# Patient Record
Sex: Male | Born: 1950 | Race: Black or African American | Hispanic: No | Marital: Married | Smoking: Former smoker
Health system: Southern US, Community
[De-identification: ages and names within clinical notes are randomized; demographics above are authoritative.]

## PROBLEM LIST (undated history)

## (undated) DIAGNOSIS — I219 Acute myocardial infarction, unspecified: Secondary | ICD-10-CM

## (undated) DIAGNOSIS — I2510 Atherosclerotic heart disease of native coronary artery without angina pectoris: Secondary | ICD-10-CM

## (undated) DIAGNOSIS — E785 Hyperlipidemia, unspecified: Secondary | ICD-10-CM

## (undated) DIAGNOSIS — N189 Chronic kidney disease, unspecified: Secondary | ICD-10-CM

## (undated) DIAGNOSIS — I10 Essential (primary) hypertension: Secondary | ICD-10-CM

## (undated) DIAGNOSIS — I499 Cardiac arrhythmia, unspecified: Secondary | ICD-10-CM

## (undated) DIAGNOSIS — I509 Heart failure, unspecified: Secondary | ICD-10-CM

## (undated) HISTORY — DX: Atherosclerotic heart disease of native coronary artery without angina pectoris: I25.10

## (undated) HISTORY — DX: Chronic kidney disease, unspecified: N18.9

## (undated) HISTORY — DX: Cardiac arrhythmia, unspecified: I49.9

## (undated) HISTORY — DX: Hyperlipidemia, unspecified: E78.5

---

## 2012-10-22 HISTORY — PX: CARDIAC DEFIBRILLATOR PLACEMENT: SHX171

## 2013-07-13 ENCOUNTER — Ambulatory Visit: Payer: Self-pay | Admitting: Cardiovascular Disease

## 2014-04-30 ENCOUNTER — Ambulatory Visit: Payer: Self-pay | Admitting: Gastroenterology

## 2014-07-05 DIAGNOSIS — I5023 Acute on chronic systolic (congestive) heart failure: Secondary | ICD-10-CM | POA: Diagnosis present

## 2014-07-05 DIAGNOSIS — I5022 Chronic systolic (congestive) heart failure: Secondary | ICD-10-CM | POA: Diagnosis present

## 2014-07-06 ENCOUNTER — Emergency Department: Payer: Self-pay | Admitting: Emergency Medicine

## 2014-07-06 LAB — BASIC METABOLIC PANEL
ANION GAP: 1 — AB (ref 7–16)
BUN: 15 mg/dL (ref 7–18)
Calcium, Total: 8.5 mg/dL (ref 8.5–10.1)
Chloride: 110 mmol/L — ABNORMAL HIGH (ref 98–107)
Co2: 30 mmol/L (ref 21–32)
Creatinine: 1.45 mg/dL — ABNORMAL HIGH (ref 0.60–1.30)
GFR CALC NON AF AMER: 52 — AB
Glucose: 93 mg/dL (ref 65–99)
Osmolality: 282 (ref 275–301)
POTASSIUM: 3.6 mmol/L (ref 3.5–5.1)
Sodium: 141 mmol/L (ref 136–145)

## 2014-07-06 LAB — CBC
HCT: 40.4 % (ref 40.0–52.0)
HGB: 13 g/dL (ref 13.0–18.0)
MCH: 29.3 pg (ref 26.0–34.0)
MCHC: 32.1 g/dL (ref 32.0–36.0)
MCV: 91 fL (ref 80–100)
Platelet: 228 10*3/uL (ref 150–440)
RBC: 4.42 10*6/uL (ref 4.40–5.90)
RDW: 14.2 % (ref 11.5–14.5)
WBC: 3.8 10*3/uL (ref 3.8–10.6)

## 2014-07-06 LAB — PRO B NATRIURETIC PEPTIDE: B-Type Natriuretic Peptide: 2228 pg/mL — ABNORMAL HIGH (ref 0–125)

## 2014-07-06 LAB — TROPONIN I: Troponin-I: <0.02 ng/mL

## 2015-02-22 DIAGNOSIS — I10 Essential (primary) hypertension: Secondary | ICD-10-CM | POA: Insufficient documentation

## 2015-03-13 ENCOUNTER — Other Ambulatory Visit: Payer: Self-pay | Admitting: Specialist

## 2015-03-13 DIAGNOSIS — R911 Solitary pulmonary nodule: Secondary | ICD-10-CM

## 2015-03-20 ENCOUNTER — Ambulatory Visit: Payer: Medicare Other

## 2015-04-15 ENCOUNTER — Ambulatory Visit: Payer: Medicare Other | Attending: Specialist

## 2015-12-08 DIAGNOSIS — R110 Nausea: Secondary | ICD-10-CM | POA: Diagnosis not present

## 2015-12-08 DIAGNOSIS — R531 Weakness: Secondary | ICD-10-CM | POA: Diagnosis not present

## 2015-12-08 DIAGNOSIS — I509 Heart failure, unspecified: Secondary | ICD-10-CM | POA: Insufficient documentation

## 2015-12-08 DIAGNOSIS — Z79899 Other long term (current) drug therapy: Secondary | ICD-10-CM | POA: Insufficient documentation

## 2015-12-08 DIAGNOSIS — Z7901 Long term (current) use of anticoagulants: Secondary | ICD-10-CM | POA: Insufficient documentation

## 2015-12-08 DIAGNOSIS — R509 Fever, unspecified: Secondary | ICD-10-CM | POA: Insufficient documentation

## 2015-12-08 DIAGNOSIS — Z7982 Long term (current) use of aspirin: Secondary | ICD-10-CM | POA: Diagnosis not present

## 2015-12-09 ENCOUNTER — Encounter: Payer: Self-pay | Admitting: Emergency Medicine

## 2015-12-09 ENCOUNTER — Emergency Department
Admission: EM | Admit: 2015-12-09 | Discharge: 2015-12-09 | Disposition: A | Discharge status: Home / Self Care | Payer: Medicare Other | Attending: Emergency Medicine | Admitting: Emergency Medicine

## 2015-12-09 ENCOUNTER — Emergency Department: Payer: Medicare Other

## 2015-12-09 DIAGNOSIS — R110 Nausea: Secondary | ICD-10-CM

## 2015-12-09 DIAGNOSIS — R531 Weakness: Secondary | ICD-10-CM

## 2015-12-09 DIAGNOSIS — R509 Fever, unspecified: Secondary | ICD-10-CM | POA: Diagnosis not present

## 2015-12-09 HISTORY — DX: Essential (primary) hypertension: I10

## 2015-12-09 HISTORY — DX: Heart failure, unspecified: I50.9

## 2015-12-09 LAB — CBC
HEMATOCRIT: 36.2 % — AB (ref 40.0–52.0)
Hemoglobin: 12.7 g/dL — ABNORMAL LOW (ref 13.0–18.0)
MCH: 30.2 pg (ref 26.0–34.0)
MCHC: 35 g/dL (ref 32.0–36.0)
MCV: 86.2 fL (ref 80.0–100.0)
PLATELETS: 261 10*3/uL (ref 150–440)
RBC: 4.2 MIL/uL — ABNORMAL LOW (ref 4.40–5.90)
RDW: 13.8 % (ref 11.5–14.5)
WBC: 6.7 10*3/uL (ref 3.8–10.6)

## 2015-12-09 LAB — COMPREHENSIVE METABOLIC PANEL
ALK PHOS: 72 U/L (ref 38–126)
ALT: 10 U/L — AB (ref 17–63)
ANION GAP: 7 (ref 5–15)
AST: 15 U/L (ref 15–41)
Albumin: 3.7 g/dL (ref 3.5–5.0)
BILIRUBIN TOTAL: 0.7 mg/dL (ref 0.3–1.2)
BUN: 20 mg/dL (ref 6–20)
CALCIUM: 8.6 mg/dL — AB (ref 8.9–10.3)
CO2: 21 mmol/L — AB (ref 22–32)
CREATININE: 2.02 mg/dL — AB (ref 0.61–1.24)
Chloride: 106 mmol/L (ref 101–111)
GFR, EST AFRICAN AMERICAN: 39 mL/min — AB (ref >60)
GFR, EST NON AFRICAN AMERICAN: 33 mL/min — AB (ref >60)
Glucose, Bld: 119 mg/dL — ABNORMAL HIGH (ref 65–99)
Potassium: 3.7 mmol/L (ref 3.5–5.1)
Sodium: 134 mmol/L — ABNORMAL LOW (ref 135–145)
TOTAL PROTEIN: 7.7 g/dL (ref 6.5–8.1)

## 2015-12-09 LAB — RAPID INFLUENZA A&B ANTIGENS (ARMC ONLY)
INFLUENZA A (ARMC): NEGATIVE
INFLUENZA B (ARMC): NEGATIVE

## 2015-12-09 LAB — TROPONIN I
Troponin I: 0.04 ng/mL — ABNORMAL HIGH (ref <0.031)
Troponin I: 0.03 ng/mL (ref <0.031)

## 2015-12-09 LAB — BRAIN NATRIURETIC PEPTIDE: B Natriuretic Peptide: 320 pg/mL — ABNORMAL HIGH (ref 0.0–100.0)

## 2015-12-09 LAB — PROTIME-INR
INR: 1.27
Prothrombin Time: 16 seconds — ABNORMAL HIGH (ref 11.4–15.0)

## 2015-12-09 NOTE — ED Provider Notes (Signed)
[facility] Emergency Department Provider Note  ____________________________________________  Time seen: Approximately 0032 AM  I have reviewed the triage vital signs and the nursing notes.   HISTORY  Chief Complaint Weakness and Nausea    HPI Bowman Blazina is a 65 y.o. male who comes into the hospital today with some nausea, fever and generalized weakness. The patient reports that tonight he went to the bathroom to brush his teeth and he started gagging. The patient reports that he had some clear mucus in his throat but didn't vomit appropriate food. The patient's wife reports that he woke up this morning not feeling well. Earlier today he had a temperature to 101.7 for which she did not take any medication. Around 9 PM his temperature was 100. The patient reports he takes aspirin and Plavix daily and did not want to add any medications to that. The patient has been drinking injury: Soda as well as fluids. He's had some decreased appetite today. His wife reports that he had some chills yesterday and is been impaired since then. I asked night he seemed to have some cold fingers as well as coldness in his chest. His wife reports that his fingers were turning purple. He felt as though he's been coming down with something since last weekend but he has been congested with some throat drainage. The patient has not had any sick contacts. He denies any chest pain, shortness of breath, abdominal pain, diarrhea, vomiting or pain with urination. The patient has had some occasional nausea and generalized weakness. The patient's wife convinced him to come into the hospital to get checked out.   Past Medical History  Diagnosis Date  . CHF (congestive heart failure) ([place])   . Hypertension     There are no active problems to display for this patient.   Past Surgical History  Procedure Laterality Date  . Cardiac defibrillator placement  feb 2014    Current Outpatient Rx   Name  Route  Sig  Dispense  Refill  . aspirin EC 325 MG tablet   Oral   Take 1 tablet by mouth daily.         . carvedilol (COREG CR) 80 MG 24 hr capsule   Oral   Take 1 capsule by mouth daily.         . clopidogrel (PLAVIX) 75 MG tablet   Oral   Take 1 tablet by mouth daily.      2   . furosemide (LASIX) 40 MG tablet   Oral   Take 1 tablet by mouth daily.      2   . olmesartan (BENICAR) 40 MG tablet   Oral   Take 1 tablet by mouth daily.         Marland Kitchen spironolactone (ALDACTONE) 25 MG tablet   Oral   Take 1 tablet by mouth daily.           Allergies Mushroom extract complex  History reviewed. No pertinent family history.  Social History Social History  Substance Use Topics  . Smoking status: Never Smoker   . Smokeless tobacco: None  . Alcohol Use: No    Review of Systems Constitutional:  fever/chills Eyes: No visual changes. ENT: No sore throat. Cardiovascular: Denies chest pain. Respiratory: Denies shortness of breath. Gastrointestinal: Nausea and gagging with No abdominal pain, no vomiting.  No diarrhea.  No constipation. Genitourinary: Negative for dysuria. Musculoskeletal: Negative for back pain. Skin: Negative for rash. Neurological: Generalized weakness  10-point  ROS otherwise negative.  ____________________________________________   PHYSICAL EXAM:  VITAL SIGNS: ED Triage Vitals  Enc Vitals Group     BP 12/09/15 0013 92/55 mmHg     Pulse Rate 12/09/15 0013 64     Resp 12/09/15 0013 18     Temp 12/09/15 0013 98.7 F (37.1 C)     Temp Source 12/09/15 0013 Oral     SpO2 12/09/15 0013 97 %     Weight 12/09/15 0013 210 lb (95.255 kg)     Height 12/09/15 0013 6' (1.829 m)     Head Cir --      Peak Flow --      Pain Score --      Pain Loc --      Pain Edu? --      Excl. in [place]? --     Constitutional: Alert and oriented. Well appearing and in no acute distress. Eyes: Conjunctivae are normal. PERRL. EOMI. Head: Atraumatic. Nose:  No congestion/rhinnorhea. Mouth/Throat: Mucous membranes are moist.  Oropharynx non-erythematous. Cardiovascular: Normal rate, regular rhythm. Grossly normal heart sounds.  Good peripheral circulation. Respiratory: Normal respiratory effort.  No retractions. Lungs CTAB. Gastrointestinal: Soft and nontender. No distention. Positive bowel sounds. Musculoskeletal: No lower extremity tenderness nor edema.   Neurologic:  Normal speech and language. No gross focal neurologic deficits are appreciated. Skin:  Skin is warm, dry and intact.  Psychiatric: Mood and affect are normal.   ____________________________________________   LABS (all labs ordered are listed, but only abnormal results are displayed)  Labs Reviewed  CBC - Abnormal; Notable for the following:    RBC 4.20 (*)    Hemoglobin 12.7 (*)    HCT 36.2 (*)    All other components within normal limits  TROPONIN I - Abnormal; Notable for the following:    Troponin I 0.04 (*)    All other components within normal limits  BRAIN NATRIURETIC PEPTIDE - Abnormal; Notable for the following:    B Natriuretic Peptide 320.0 (*)    All other components within normal limits  COMPREHENSIVE METABOLIC PANEL - Abnormal; Notable for the following:    Sodium 134 (*)    CO2 21 (*)    Glucose, Bld 119 (*)    Creatinine, Ser 2.02 (*)    Calcium 8.6 (*)    ALT 10 (*)    GFR calc non Af Amer 33 (*)    GFR calc Af Amer 39 (*)    All other components within normal limits  PROTIME-INR - Abnormal; Notable for the following:    Prothrombin Time 16.0 (*)    All other components within normal limits  RAPID INFLUENZA A&B ANTIGENS ([facility] ONLY)  TROPONIN I   ____________________________________________  EKG  ED ECG REPORT I, Loney Hering, the attending physician, personally viewed and interpreted this ECG.   Date: 12/09/2015  EKG Time: 0009  Rate: 70  Rhythm: sinus tachycardia  Axis: Normal  Intervals:Incomplete left bundle branch block   ST&T Change: Flipped T waves in leads 1 and aVL as well as leads II and aVF, V5 and V6  ____________________________________________  RADIOLOGY  Chest x-ray: Hyperinflation, no acute cardiopulmonary findings. ____________________________________________   PROCEDURES  Procedure(s) performed: None  Critical Care performed: No  ____________________________________________   INITIAL IMPRESSION / ASSESSMENT AND PLAN / ED COURSE  Pertinent labs & imaging results that were available during my care of the patient were reviewed by me and considered in my medical decision making (see chart for details).  This is a 65 year old male who comes into the hospital today with some nausea and fevers earlier today. The patient has not been eating much but otherwise has no acute symptoms. We'll check some blood work and have the patient drink some ginger ale here. We will reassess the patient once I received the results of his blood work.  The patient is sleeping without any difficulty in the emergency department. He was able to treat some ginger ale without vomiting here. We talked about the idea that we did not obtain a urinalysis but as he is not having any symptoms and his blood work is unremarkable S feel the patient can be discharged home. I did encourage him to follow up with his primary care physician. The patient be discharged to follow-up. ____________________________________________   FINAL CLINICAL IMPRESSION(S) / ED DIAGNOSES  Final diagnoses:  Fever, unspecified fever cause  Nausea  Weakness      Loney Hering, MD 12/09/15 [phone]

## 2015-12-09 NOTE — Discharge Instructions (Signed)
Fever, Adult A fever is an increase in the body's temperature. It is usually defined as a temperature of 100F (38C) or higher. Brief mild or moderate fevers generally have no long-term effects, and they often do not require treatment. Moderate or high fevers may make you feel uncomfortable and can sometimes be a sign of a serious illness or disease. The sweating that may occur with repeated or prolonged fever may also cause dehydration. Fever is confirmed by taking a temperature with a thermometer. A measured temperature can vary with:  Age.  Time of day.  Location of the thermometer:  Mouth (oral).  Rectum (rectal).  Ear (tympanic).  Underarm (axillary).  Forehead (temporal). HOME CARE INSTRUCTIONS Pay attention to any changes in your symptoms. Take these actions to help with your condition:  Take over-the counter and prescription medicines only as told by your health care provider. Follow the dosing instructions carefully.  If you were prescribed an antibiotic medicine, take it as told by your health care provider. Do not stop taking the antibiotic even if you start to feel better.  Rest as needed.  Drink enough fluid to keep your urine clear or pale yellow. This helps to prevent dehydration.  Sponge yourself or bathe with room-temperature water to help reduce your body temperature as needed. Do not use ice water.  Do not overbundle yourself in blankets or heavy clothes. SEEK MEDICAL CARE IF:  You vomit.  You cannot eat or drink without vomiting.  You have diarrhea.  You have pain when you urinate.  Your symptoms do not improve with treatment.  You develop new symptoms.  You develop excessive weakness. SEEK IMMEDIATE MEDICAL CARE IF:  You have shortness of breath or have trouble breathing.  You are dizzy or you faint.  You are disoriented or confused.  You develop signs of dehydration, such as a dry mouth, decreased urination, or paleness.  You develop  severe pain in your abdomen.  You have persistent vomiting or diarrhea.  You develop a skin rash.  Your symptoms suddenly get worse.   This information is not intended to replace advice given to you by your health care provider. Make sure you discuss any questions you have with your health care provider.   Document Released: 03/03/2001 Document Revised: 05/29/2015 Document Reviewed: 11/01/2014 Elsevier Interactive Patient Education 2016 Elsevier Inc.  Nausea, Adult Nausea is the feeling that you have an upset stomach or have to vomit. Nausea by itself is not likely a serious concern, but it may be an early sign of more serious medical problems. As nausea gets worse, it can lead to vomiting. If vomiting develops, there is the risk of dehydration.  CAUSES   Viral infections.  Food poisoning.  Medicines.  Pregnancy.  Motion sickness.  Migraine headaches.  Emotional distress.  Severe pain from any source.  Alcohol intoxication. HOME CARE INSTRUCTIONS  Get plenty of rest.  Ask your caregiver about specific rehydration instructions.  Eat small amounts of food and sip liquids more often.  Take all medicines as told by your caregiver. SEEK MEDICAL CARE IF:  You have not improved after 2 days, or you get worse.  You have a headache. SEEK IMMEDIATE MEDICAL CARE IF:   You have a fever.  You faint.  You keep vomiting or have blood in your vomit.  You are extremely weak or dehydrated.  You have dark or bloody stools.  You have severe chest or abdominal pain. MAKE SURE YOU:  Understand these instructions.  Will watch your condition.  Will get help right away if you are not doing well or get worse.   This information is not intended to replace advice given to you by your health care provider. Make sure you discuss any questions you have with your health care provider.   Document Released: 10/15/2004 Document Revised: 09/28/2014 Document Reviewed:  05/20/2011 Elsevier Interactive Patient Education 2016 Elsevier Inc.  Weakness Weakness is a lack of strength. It may be felt all over the body (generalized) or in one specific part of the body (focal). Some causes of weakness can be serious. You may need further medical evaluation, especially if you are elderly or you have a history of immunosuppression (such as chemotherapy or HIV), kidney disease, heart disease, or diabetes. CAUSES  Weakness can be caused by many different things, including:  Infection.  Physical exhaustion.  Internal bleeding or other blood loss that results in a lack of red blood cells (anemia).  Dehydration. This cause is more common in elderly people.  Side effects or electrolyte abnormalities from medicines, such as pain medicines or sedatives.  Emotional distress, anxiety, or depression.  Circulation problems, especially severe peripheral arterial disease.  Heart disease, such as rapid atrial fibrillation, bradycardia, or heart failure.  Nervous system disorders, such as Guillain-Barr syndrome, multiple sclerosis, or stroke. DIAGNOSIS  To find the cause of your weakness, your caregiver will take your history and perform a physical exam. Lab tests or X-rays may also be ordered, if needed. TREATMENT  Treatment of weakness depends on the cause of your symptoms and can vary greatly. HOME CARE INSTRUCTIONS   Rest as needed.  Eat a well-balanced diet.  Try to get some exercise every day.  Only take over-the-counter or prescription medicines as directed by your caregiver. SEEK MEDICAL CARE IF:   Your weakness seems to be getting worse or spreads to other parts of your body.  You develop new aches or pains. SEEK IMMEDIATE MEDICAL CARE IF:   You cannot perform your normal daily activities, such as getting dressed and feeding yourself.  You cannot walk up and down stairs, or you feel exhausted when you do so.  You have shortness of breath or chest  pain.  You have difficulty moving parts of your body.  You have weakness in only one area of the body or on only one side of the body.  You have a fever.  You have trouble speaking or swallowing.  You cannot control your bladder or bowel movements.  You have black or bloody vomit or stools. MAKE SURE YOU:  Understand these instructions.  Will watch your condition.  Will get help right away if you are not doing well or get worse.   This information is not intended to replace advice given to you by your health care provider. Make sure you discuss any questions you have with your health care provider.   Document Released: 09/07/2005 Document Revised: 03/08/2012 Document Reviewed: 11/06/2011 Elsevier Interactive Patient Education [employer].

## 2015-12-09 NOTE — ED Notes (Signed)
Pt to triage with wife, CO weakness, gagging on "clear mucus" and nausea.  Pt wife reports fever today (102F) and chills.  Pt reports hx of CHF, HTN, defibrillator implant (FEb 2014), "weak heart".  Pt poor historian.    Pt appears in NAD, lungs sounds clear.

## 2016-01-02 ENCOUNTER — Emergency Department (HOSPITAL_COMMUNITY)
Admission: EM | Admit: 2016-01-02 | Discharge: 2016-01-02 | Disposition: A | Discharge status: Home / Self Care | Payer: Medicare Other | Attending: Emergency Medicine | Admitting: Emergency Medicine

## 2016-01-02 ENCOUNTER — Encounter (HOSPITAL_COMMUNITY): Payer: Self-pay

## 2016-01-02 DIAGNOSIS — I509 Heart failure, unspecified: Secondary | ICD-10-CM | POA: Insufficient documentation

## 2016-01-02 DIAGNOSIS — I252 Old myocardial infarction: Secondary | ICD-10-CM | POA: Diagnosis not present

## 2016-01-02 DIAGNOSIS — R55 Syncope and collapse: Secondary | ICD-10-CM | POA: Diagnosis present

## 2016-01-02 DIAGNOSIS — Z87891 Personal history of nicotine dependence: Secondary | ICD-10-CM | POA: Insufficient documentation

## 2016-01-02 DIAGNOSIS — I951 Orthostatic hypotension: Secondary | ICD-10-CM | POA: Insufficient documentation

## 2016-01-02 DIAGNOSIS — Z7982 Long term (current) use of aspirin: Secondary | ICD-10-CM | POA: Insufficient documentation

## 2016-01-02 DIAGNOSIS — I10 Essential (primary) hypertension: Secondary | ICD-10-CM | POA: Diagnosis not present

## 2016-01-02 DIAGNOSIS — Z79899 Other long term (current) drug therapy: Secondary | ICD-10-CM | POA: Diagnosis not present

## 2016-01-02 DIAGNOSIS — Z7902 Long term (current) use of antithrombotics/antiplatelets: Secondary | ICD-10-CM | POA: Diagnosis not present

## 2016-01-02 HISTORY — DX: Acute myocardial infarction, unspecified: I21.9

## 2016-01-02 LAB — BASIC METABOLIC PANEL
ANION GAP: 10 (ref 5–15)
BUN: 40 mg/dL — ABNORMAL HIGH (ref 6–20)
CO2: 23 mmol/L (ref 22–32)
Calcium: 9 mg/dL (ref 8.9–10.3)
Chloride: 102 mmol/L (ref 101–111)
Creatinine, Ser: 2.37 mg/dL — ABNORMAL HIGH (ref 0.61–1.24)
GFR, EST AFRICAN AMERICAN: 32 mL/min — AB (ref >60)
GFR, EST NON AFRICAN AMERICAN: 28 mL/min — AB (ref >60)
GLUCOSE: 110 mg/dL — AB (ref 65–99)
POTASSIUM: 5.4 mmol/L — AB (ref 3.5–5.1)
Sodium: 135 mmol/L (ref 135–145)

## 2016-01-02 LAB — CBC
HEMATOCRIT: 37.7 % — AB (ref 39.0–52.0)
HEMOGLOBIN: 12.6 g/dL — AB (ref 13.0–17.0)
MCH: 30.1 pg (ref 26.0–34.0)
MCHC: 33.4 g/dL (ref 30.0–36.0)
MCV: 90 fL (ref 78.0–100.0)
Platelets: 254 10*3/uL (ref 150–400)
RBC: 4.19 MIL/uL — AB (ref 4.22–5.81)
RDW: 14 % (ref 11.5–15.5)
WBC: 5.3 10*3/uL (ref 4.0–10.5)

## 2016-01-02 LAB — I-STAT TROPONIN, ED: Troponin i, poc: 0.01 ng/mL (ref 0.00–0.08)

## 2016-01-02 MED ORDER — HYDROCODONE-ACETAMINOPHEN 5-325 MG PO TABS: 1.0000 | ORAL_TABLET | ORAL | Status: DC | PRN

## 2016-01-02 MED ORDER — SODIUM CHLORIDE 0.9 % IV BOLUS (SEPSIS)
1000.0000 mL | Freq: Once | INTRAVENOUS | Status: AC
  Administered 2016-01-02: 1000 mL via INTRAVENOUS

## 2016-01-02 NOTE — ED Notes (Signed)
Pt brought in EMS for near syncope and n/v.  Pt reports he was sitting [place] the [place] an apartment [place] when he became very nauseous.  Pt then exited truck to vomit and became very dizzy and had to sit down.  No LOC.  Pt did vomit x2.  Pt sts "I feel fine now.  I feel great."  Pt has ICD and extensive cardiac hx.

## 2016-01-02 NOTE — ED Notes (Signed)
Orthostatic Vital Signs - pt denies feelings of light headedness or dizziness

## 2016-01-02 NOTE — ED Provider Notes (Signed)
CSN: [id number]     Arrival date & time 01/02/16  1329 History   First MD Initiated Contact with Patient 01/02/16 1351     Chief Complaint  Patient presents with  . Near Syncope  . Emesis   (Consider location/radiation/quality/duration/timing/severity/associated sxs/prior Treatment) HPI 65 y.o. male with a hx of CHF, ICD, HTN, presents to the Emergency Department today complaining of near syncopal event with N/V and dizziness. Pt states that he was sitting in his car and eating a salad when he felt nauseous. He got out of the car and stood up with immediate dizziness with his knees buckling. Proceeded to have 1 bout of emesis. Pt sat down due to shaking. Wife witnessed event inside and called EMS. Given Zofran en route with relief of nausea. Pt does have extensive cardiac history with 5 stents placed due to abnormal stress test. Recent stress with Cardiologist (Dr. Nehemiah Massed) in [place] and pt stated it was fine. Pt has ICD due to heart failure. No CP/SOB/ABD pain. No headaches. No dizziness. No N/V/D at this time. No vision changes. No fevers. No other symptoms noted.    Past Medical History  Diagnosis Date  . CHF (congestive heart failure) ([place])   . Hypertension   . Myocardial infarct [facility])    Past Surgical History  Procedure Laterality Date  . Cardiac defibrillator placement  feb 2014   History reviewed. No pertinent family history. Social History  Substance Use Topics  . Smoking status: Former Research scientist (life sciences)  . Smokeless tobacco: None  . Alcohol Use: No    Review of Systems ROS reviewed and all are negative for acute change except as noted in the HPI.  Allergies  Mushroom extract complex  Home Medications   Prior to Admission medications   Medication Sig Start Date End Date Taking? Authorizing Provider  aspirin EC 325 MG tablet Take 1 tablet by mouth daily. 11/06/15   Historical Provider, MD  carvedilol (COREG CR) 80 MG 24 hr capsule Take 1 capsule by mouth daily. 11/06/15    Historical Provider, MD  clopidogrel (PLAVIX) 75 MG tablet Take 1 tablet by mouth daily. 09/27/15   Historical Provider, MD  furosemide (LASIX) 40 MG tablet Take 1 tablet by mouth daily. 09/27/15   Historical Provider, MD  olmesartan (BENICAR) 40 MG tablet Take 1 tablet by mouth daily. 11/06/15   Historical Provider, MD  spironolactone (ALDACTONE) 25 MG tablet Take 1 tablet by mouth daily. 11/06/15   Historical Provider, MD   BP 99/59 mmHg  Pulse 60  Temp(Src) 98 F (36.7 C) (Oral)  Resp 12  Ht 5\' 9"  (1.753 m)  Wt 92.08 kg  BMI 29.96 kg/m2  SpO2 100%   Physical Exam  Constitutional: He is oriented to person, place, and time. He appears well-developed and well-nourished.  HENT:  Head: Normocephalic and atraumatic.  Eyes: EOM are normal. Pupils are equal, round, and reactive to light.  Pale conjunctiva  Neck: Normal range of motion. Neck supple. No tracheal deviation present.  Cardiovascular: Normal rate, regular rhythm, normal heart sounds and intact distal pulses.   No murmur heard. Pulmonary/Chest: Effort normal and breath sounds normal. No respiratory distress. He has no wheezes. He has no rales. He exhibits no tenderness.  Abdominal: Soft. There is no tenderness.  Musculoskeletal: Normal range of motion.  Neurological: He is alert and oriented to person, place, and time. He has normal strength. No cranial nerve deficit or sensory deficit.  Cranial Nerves:  II: Pupils equal, round, reactive to light  III,IV, VI: ptosis not present, extra-ocular motions intact bilaterally  V,VII: smile symmetric, facial light touch sensation equal VIII: hearing grossly normal bilaterally  IX,X: midline uvula rise  XI: bilateral shoulder shrug equal and strong XII: midline tongue extension  Skin: Skin is warm and dry.  Psychiatric: He has a normal mood and affect. His behavior is normal. Thought content normal.  Nursing note and vitals reviewed.  ED Course  Procedures (including critical care  time) Labs Review Labs Reviewed  CBC - Abnormal; Notable for the following:    RBC 4.19 (*)    Hemoglobin 12.6 (*)    HCT 37.7 (*)    All other components within normal limits  BASIC METABOLIC PANEL  I-STAT TROPOININ, ED   Imaging Review No results found. I have personally reviewed and evaluated these images and lab results as part of my medical decision-making.   EKG Interpretation   Date/Time:  Thursday January 02 2016 13:43:39 EDT Ventricular Rate:  60 PR Interval:  242 QRS Duration: 116 QT Interval:  428 QTC Calculation: 428 R Axis:   -31 Text Interpretation:  Atrial-paced rhythm Incomplete left bundle branch  block LVH with secondary repolarization abnormality No significant change  since last tracing Confirmed by Maryan Rued  MD, Loree Fee ([id number]) on 01/02/2016  1:45:30 PM      MDM  I have reviewed and evaluated the relevant laboratory values.  I personally evaluated and interpreted the relevant EKG. I have reviewed the relevant previous healthcare records. I obtained HPI from historian. Patient discussed with supervising physician  ED Course:  Assessment: Pt is a 64yM with hx HTn, CHF, ICD who presents with near syncopal event with N/V and dizziness. Pt asymptomatic currently. Recent has had echo/stress from Cardiologist in the past month. On exam, pt in NAD. Nontoxic/nonseptic appearing. VSS. Afebrile. Lungs CTA. Heart RRR. Abdomen nontender soft. Orthostatics obtained and consistent with near syncope. EKG Atrial paced with incomplete LBBB. No significant change from previous. Labs show negative Trop. Potassium 5.4 with elevated Creatinine 2.37. Given NS Bolus in ED. Symptoms/Labs consistent with dehydration. Plan is to [place] with follow up to PCP. At time of discharge, Patient is in no acute distress. Vital Signs are stable. Patient is able to ambulate. Patient able to tolerate PO.    Disposition/Plan:  DC Home Additional Verbal discharge instructions given and discussed  with patient.  Pt Instructed to f/u with PCP in the next week for evaluation and treatment of symptoms. Return precautions given Pt acknowledges and agrees with plan  Supervising Physician Blanchie Dessert, MD   Final diagnoses:  Orthostatic hypotension       Shary Decamp, PA-C 01/02/16 [place], MD 01/02/16 2227

## 2016-01-02 NOTE — Discharge Instructions (Signed)
Please read and follow all provided instructions.  Your diagnoses today include:  1. Orthostatic hypotension    Tests performed today include:  Vital signs. See below for your results today.   Medications prescribed:   None  Home care instructions:  Follow any educational materials contained in this packet.  Follow-up instructions: Please follow-up with your primary care provider in the next week for further evaluation of symptoms and treatment   Return instructions:   Please return to the Emergency Department if you do not get better, if you get worse, or new symptoms OR  - Fever (temperature greater than 101.55F)  - Bleeding that does not stop with holding pressure to the area    -Severe pain (please note that you may be more sore the day after your accident)  - Chest Pain  - Difficulty breathing  - Severe nausea or vomiting  - Inability to tolerate food and liquids  - Passing out  - Skin becoming red around your wounds  - Change in mental status (confusion or lethargy)  - New numbness or weakness     Please return if you have any other emergent concerns.  Additional Information:  Your vital signs today were: BP 99/59 mmHg   Pulse 60   Temp(Src) 98 F (36.7 C) (Oral)   Resp 12   Ht 5\' 9"  (1.753 m)   Wt 92.08 kg   BMI 29.96 kg/m2   SpO2 100% If your blood pressure (BP) was elevated above 135/85 this visit, please have this repeated by your doctor within one month. ---------------

## 2020-03-03 ENCOUNTER — Encounter: Payer: Self-pay | Admitting: Emergency Medicine

## 2020-03-03 ENCOUNTER — Emergency Department: Payer: Medicare Other

## 2020-03-03 ENCOUNTER — Other Ambulatory Visit: Payer: Self-pay

## 2020-03-03 ENCOUNTER — Emergency Department
Admission: EM | Admit: 2020-03-03 | Discharge: 2020-03-03 | Disposition: A | Discharge status: Home / Self Care | Payer: Medicare Other | Attending: Emergency Medicine | Admitting: Emergency Medicine

## 2020-03-03 DIAGNOSIS — Z79899 Other long term (current) drug therapy: Secondary | ICD-10-CM | POA: Diagnosis not present

## 2020-03-03 DIAGNOSIS — Z7902 Long term (current) use of antithrombotics/antiplatelets: Secondary | ICD-10-CM | POA: Insufficient documentation

## 2020-03-03 DIAGNOSIS — Z95810 Presence of automatic (implantable) cardiac defibrillator: Secondary | ICD-10-CM | POA: Insufficient documentation

## 2020-03-03 DIAGNOSIS — I110 Hypertensive heart disease with heart failure: Secondary | ICD-10-CM | POA: Insufficient documentation

## 2020-03-03 DIAGNOSIS — I509 Heart failure, unspecified: Secondary | ICD-10-CM | POA: Insufficient documentation

## 2020-03-03 DIAGNOSIS — Z7982 Long term (current) use of aspirin: Secondary | ICD-10-CM | POA: Insufficient documentation

## 2020-03-03 DIAGNOSIS — Z87891 Personal history of nicotine dependence: Secondary | ICD-10-CM | POA: Insufficient documentation

## 2020-03-03 DIAGNOSIS — R0602 Shortness of breath: Secondary | ICD-10-CM | POA: Diagnosis present

## 2020-03-03 LAB — CBC
HCT: 45.2 % (ref 39.0–52.0)
Hemoglobin: 15 g/dL (ref 13.0–17.0)
MCH: 30.3 pg (ref 26.0–34.0)
MCHC: 33.2 g/dL (ref 30.0–36.0)
MCV: 91.3 fL (ref 80.0–100.0)
Platelets: 252 10*3/uL (ref 150–400)
RBC: 4.95 MIL/uL (ref 4.22–5.81)
RDW: 13.8 % (ref 11.5–15.5)
WBC: 4.3 10*3/uL (ref 4.0–10.5)
nRBC: 0 % (ref 0.0–0.2)

## 2020-03-03 LAB — COMPREHENSIVE METABOLIC PANEL
ALT: 37 U/L (ref 0–44)
AST: 32 U/L (ref 15–41)
Albumin: 3.7 g/dL (ref 3.5–5.0)
Alkaline Phosphatase: 111 U/L (ref 38–126)
Anion gap: 9 (ref 5–15)
BUN: 22 mg/dL (ref 8–23)
CO2: 23 mmol/L (ref 22–32)
Calcium: 8.8 mg/dL — ABNORMAL LOW (ref 8.9–10.3)
Chloride: 104 mmol/L (ref 98–111)
Creatinine, Ser: 1.46 mg/dL — ABNORMAL HIGH (ref 0.61–1.24)
GFR calc Af Amer: 57 mL/min — ABNORMAL LOW (ref >60)
GFR calc non Af Amer: 49 mL/min — ABNORMAL LOW (ref >60)
Glucose, Bld: 106 mg/dL — ABNORMAL HIGH (ref 70–99)
Potassium: 4 mmol/L (ref 3.5–5.1)
Sodium: 136 mmol/L (ref 135–145)
Total Bilirubin: 2.2 mg/dL — ABNORMAL HIGH (ref 0.3–1.2)
Total Protein: 7.1 g/dL (ref 6.5–8.1)

## 2020-03-03 LAB — TROPONIN I (HIGH SENSITIVITY): Troponin I (High Sensitivity): 17 ng/L (ref <18)

## 2020-03-03 LAB — BRAIN NATRIURETIC PEPTIDE: B Natriuretic Peptide: 1890.1 pg/mL — ABNORMAL HIGH (ref 0.0–100.0)

## 2020-03-03 MED ORDER — FUROSEMIDE 10 MG/ML IJ SOLN
40.0000 mg | Freq: Once | INTRAMUSCULAR | Status: AC
Start: 2020-03-03 — End: 2020-03-03
  Administered 2020-03-03: 40 mg via INTRAVENOUS
  Filled 2020-03-03: qty 4

## 2020-03-03 MED ORDER — SODIUM CHLORIDE 0.9% FLUSH: 3.0000 mL | Freq: Once | INTRAVENOUS | Status: DC

## 2020-03-03 NOTE — ED Provider Notes (Signed)
[facility] Emergency Department Provider Note   ____________________________________________    I have reviewed the triage vital signs and the nursing notes.   HISTORY  Chief Complaint Shortness of Breath     HPI Isaac [name] is a 69 y.o. male with a history of CHF, hypertension, MI who presents with shortness of breath.  Patient reports over the last 2 days he has been markedly shortness of breath when trying to lie down at all.  He states this is atypical for him.  He does have a history of CHF secondary to ischemic cardiomyopathy, follows with Dr. Nehemiah Massed, most recent echocardiogram of EF of 15%.  Typically controls his volume status very well, takes Lasix in the morning and occasionally in second dose in the evening if any swelling of the feet  Past Medical History:  Diagnosis Date  . CHF (congestive heart failure) ([place])   . Hypertension   . Myocardial infarct (HCC)     There are no problems to display for this patient.   Past Surgical History:  Procedure Laterality Date  . CARDIAC DEFIBRILLATOR PLACEMENT  feb 2014    Prior to Admission medications   Medication Sig Start Date End Date Taking? Authorizing Provider  aspirin EC 325 MG tablet Take 1 tablet by mouth daily. 11/06/15   [provider]  carvedilol (COREG CR) 80 MG 24 hr capsule Take 1 capsule by mouth daily. 11/06/15   [provider]  clopidogrel (PLAVIX) 75 MG tablet Take 1 tablet by mouth daily. 09/27/15   [provider]  furosemide (LASIX) 40 MG tablet Take 1 tablet by mouth daily. 09/27/15   [provider]  HYDROcodone-acetaminophen (NORCO/VICODIN) 5-325 MG tablet Take 1 tablet by mouth every 4 (four) hours as needed. 01/02/16   Shary Decamp, PA-C  olmesartan (BENICAR) 40 MG tablet Take 1 tablet by mouth daily. 11/06/15   [provider]  spironolactone (ALDACTONE) 25 MG tablet Take 1 tablet by mouth daily. 11/06/15   [provider]     Allergies Mushroom extract complex and Neosporin [neomycin-bacitracin zn-polymyx]  History reviewed. No pertinent family history.  Social History Social History   Tobacco Use  . Smoking status: Former Research scientist (life sciences)  . Smokeless tobacco: Never Used  Substance Use Topics  . Alcohol use: No  . Drug use: No    Review of Systems  Constitutional: No fever/chills Eyes: No visual changes.  ENT: No sore throat. Cardiovascular: Denies chest pain. Respiratory: As above Gastrointestinal: No abdominal pain.  No nausea, no vomiting.   Genitourinary: Negative for dysuria. Musculoskeletal: Negative for back pain. Skin: Negative for rash. Neurological: Negative for headaches or weakness   ____________________________________________   PHYSICAL EXAM:  VITAL SIGNS: ED Triage Vitals  Enc Vitals Group     BP 03/03/20 1016 128/71     Pulse Rate 03/03/20 1016 78     Resp 03/03/20 1016 20     Temp 03/03/20 1016 97.7 F (36.5 C)     Temp Source 03/03/20 1016 Oral     SpO2 03/03/20 1016 98 %     Weight 03/03/20 1007 79.4 kg (175 lb)     Height 03/03/20 1007 1.753 m (5\' 9" )     Head Circumference --      Peak Flow --      Pain Score 03/03/20 1007 0     Pain Loc --      Pain Edu? --      Excl. in [place]? --  Constitutional: Alert and oriented. Eyes: Conjunctivae are normal.   Mouth/Throat: Mucous membranes are moist.   Neck:  Painless ROM Cardiovascular: Normal rate, regular rhythm.   Good peripheral circulation. Respiratory: Normal respiratory effort.  No retractions.  Faint rales bibasilarly Gastrointestinal: Soft and nontender. No distention.  No CVA tenderness. Genitourinary: deferred Musculoskeletal: No lower extremity tenderness nor edema.  Warm and well perfused Neurologic:  Normal speech and language. No gross focal neurologic deficits are appreciated.  Skin:  Skin is warm, dry and intact. No rash noted. Psychiatric: Mood and affect are normal. Speech and  behavior are normal.  ____________________________________________   LABS (all labs ordered are listed, but only abnormal results are displayed)  Labs Reviewed  COMPREHENSIVE METABOLIC PANEL - Abnormal; Notable for the following components:      Result Value   Glucose, Bld 106 (*)    Creatinine, Ser 1.46 (*)    Calcium 8.8 (*)    Total Bilirubin 2.2 (*)    GFR calc non Af Amer 49 (*)    GFR calc Af Amer 57 (*)    All other components within normal limits  BRAIN NATRIURETIC PEPTIDE - Abnormal; Notable for the following components:   B Natriuretic Peptide 1,890.1 (*)    All other components within normal limits  CBC  TROPONIN I (HIGH SENSITIVITY)   ____________________________________________  EKG  ED ECG REPORT I, Lavonia Drafts, the attending physician, personally viewed and interpreted this ECG.  Date: 03/03/2020  Rhythm: normal sinus rhythm QRS Axis: normal Intervals: normal ST/T Wave abnormalities: Nonspecific changes Narrative Interpretation: no evidence of acute ischemia  ____________________________________________  RADIOLOGY  Chest x-ray reviewed by me, demonstrates cardiomegaly ____________________________________________   PROCEDURES  Procedure(s) performed: yes  .1-3 Lead EKG Interpretation Performed by: Lavonia Drafts, MD Authorized by: Lavonia Drafts, MD     Interpretation: normal     ECG rate assessment: normal     Rhythm: sinus rhythm     Ectopy: none     Conduction: normal       Critical Care performed: No ____________________________________________   INITIAL IMPRESSION / ASSESSMENT AND PLAN / ED COURSE  Pertinent labs & imaging results that were available during my care of the patient were reviewed by me and considered in my medical decision making (see chart for details).  Patient presents with shortness of breath with lying down and exertion x2 days in the setting of an EF of 15%.  Strongly suspicious for acute on chronic CHF  exacerbation/volume overload, differential also includes pneumonia, pleural effusion.  No chest pain to suggest ACS.  No pleurisy to suggest PE.  CBC is unremarkable, mildly elevated troponin of 17 likely related to strain.  BNP is markedly elevated at [place]  Chest x-ray demonstrates cardiomegaly, no clear edema on x-ray, will perform ambulatory pulse ox  99% with ambulatory pulse ox.  Patient is feeling well and had good response to 40 of IV Lasix.  Discussed with Dr. Clayborn Bigness who notes that patient will be seen by one of the cardiologists in the morning and follow-up  Discussed this plan with patient and he agrees.    ____________________________________________   FINAL CLINICAL IMPRESSION(S) / ED DIAGNOSES  Final diagnoses:  Acute on chronic congestive heart failure, unspecified heart failure type [facility])        Note:  This document was prepared using Dragon voice recognition software and may include unintentional dictation errors.   Lavonia Drafts, MD 03/03/20 [phone]

## 2020-03-03 NOTE — ED Notes (Signed)
Pt walked around room and down hall. Denies any SOB or trouble walking. Sats stayed between 99-100%.

## 2020-03-03 NOTE — ED Triage Notes (Signed)
PT to ER with c/o [facility] that started several days ago. Pt has hx of CHF.

## 2020-03-03 NOTE — ED Notes (Signed)
See triage note, pt reports SHOB that started several days ago, CHF hx.  SpO2 100% on RA. Pt speaking in complete sentences. NAD noted, RR even and unlabored. Pt to treatment room in w/c.  Reports taking increased lasix yesterday with no improvement.  C/o BLE swelling/edema. No swelling noted on assessment.  Alert and oriented.

## 2020-03-12 ENCOUNTER — Other Ambulatory Visit: Payer: Self-pay | Admitting: Family Medicine

## 2020-03-12 DIAGNOSIS — R1031 Right lower quadrant pain: Secondary | ICD-10-CM

## 2020-03-13 ENCOUNTER — Other Ambulatory Visit: Payer: Self-pay

## 2020-03-13 ENCOUNTER — Ambulatory Visit
Admission: RE | Admit: 2020-03-13 | Discharge: 2020-03-13 | Disposition: A | Discharge status: Home / Self Care | Payer: Medicare Other | Source: Ambulatory Visit | Attending: Family Medicine | Admitting: Family Medicine

## 2020-03-13 DIAGNOSIS — R1031 Right lower quadrant pain: Secondary | ICD-10-CM

## 2020-03-13 MED ORDER — IOHEXOL 300 MG/ML  SOLN
100.0000 mL | Freq: Once | INTRAMUSCULAR | Status: AC | PRN
  Administered 2020-03-13: 85 mL via INTRAVENOUS

## 2020-03-26 ENCOUNTER — Ambulatory Visit: Payer: Medicare Other

## 2020-03-30 NOTE — Progress Notes (Signed)
04/01/2020 10:33 AM   Isaac [name] 04/04/1951 [id number]  Referring provider: [name], MD [address] [facility] [place],  [place] [address] Chief Complaint  Patient presents with  . Nephrolithiasis    HPI: Isaac [name] is a 69 y.o. male who is seen for evaluation and management of nephrolithiasis.   -Patient saw Dr. Tawnya Crook on 03/12/2020 and noted right sided abdominal pain with associated nausea and vomiting x 1 [name] (vomiting resolved [name] of visit).  -He also had some scrotal cyst drainage which has resolved. -No fevers. -CT A/P on 03/13/2020 revealed ovoid 8 mm stone at the right ureterovesical junction without significant right-sided hydronephrosis, although there was mild right perinephric stranding and a striated delayed nephrogram suggesting a degree of obstruction.  -Continues to have occasional right sided abdominal pain with associated nausea  -Intermittent urinary frequency, urgency, weak stream   PMH: Past Medical History:  Diagnosis Date  . CHF (congestive heart failure) ([place])   . Hypertension   . Myocardial infarct [facility])     Surgical History: Past Surgical History:  Procedure Laterality Date  . CARDIAC DEFIBRILLATOR PLACEMENT  feb 2014    Home Medications:  Allergies as of 04/01/2020      Reactions   Mushroom Extract Complex Nausea And Vomiting   Neosporin [neomycin-bacitracin Zn-polymyx] Hives      Medication List       Accurate as of April 01, 2020 10:33 AM. If you have any questions, ask your nurse or doctor.        aspirin EC 325 MG tablet Take 1 tablet by mouth daily.   Benicar 40 MG tablet Generic drug: olmesartan Take 1 tablet by mouth daily.   clopidogrel 75 MG tablet Commonly known as: PLAVIX Take 1 tablet by mouth daily.   Coreg CR 80 MG 24 hr capsule Generic drug: carvedilol Take 1 capsule by mouth daily.   Entresto 24-26 MG Generic drug: sacubitril-valsartan SMARTSIG:1 Tablet(s) By  Mouth Every 12 Hours   furosemide 40 MG tablet Commonly known as: LASIX Take 1 tablet by mouth daily.   HYDROcodone-acetaminophen 5-325 MG tablet Commonly known as: NORCO/VICODIN Take 1 tablet by mouth every 4 (four) hours as needed.   montelukast 10 MG tablet Commonly known as: SINGULAIR Take by mouth.   sildenafil 100 MG tablet Commonly known as: VIAGRA Take by mouth.   spironolactone 25 MG tablet Commonly known as: ALDACTONE Take 1 tablet by mouth daily.       Allergies:  Allergies  Allergen Reactions  . Mushroom Extract Complex Nausea And Vomiting  . Neosporin [Neomycin-Bacitracin Zn-Polymyx] Hives    Family History: No family history on file.  Social History:  reports that he has quit smoking. He has never used smokeless tobacco. He reports that he does not drink alcohol and does not use drugs.   Physical Exam: BP 102/65   Pulse 68   Ht 5\' 9"  (1.753 m)   Wt 165 lb (74.8 kg)   BMI 24.37 kg/m   Constitutional:  Alert and oriented, No acute distress. HEENT: [place] AT, moist mucus membranes.  Trachea midline, no masses. Cardiovascular: No clubbing, cyanosis, or edema.  RRR Respiratory: Normal respiratory effort, no increased work of breathing.  Clear GI: Abdomen is soft, nontender, nondistended, no abdominal masses GU: No CVA tenderness Lymph: No cervical or inguinal lymphadenopathy. Skin: No rashes, bruises or suspicious lesions. Neurologic: Grossly intact, no focal deficits, moving all 4 extremities. Psychiatric: Normal mood and affect.  Pertinent Imaging:   CLINICAL DATA:  Right lower quadrant pain for 1 week  EXAM: CT ABDOMEN AND PELVIS WITH CONTRAST  TECHNIQUE: Multidetector CT imaging of the abdomen and pelvis was performed using the standard protocol following bolus administration of intravenous contrast.  CONTRAST:  7mL OMNIPAQUE IOHEXOL 300 MG/ML  SOLN  COMPARISON:  None.  FINDINGS: Lower chest: Heart size is mildly enlarged. No  pericardial effusion. Lung bases are clear.  Hepatobiliary: Mildly decreased attenuation of the hepatic parenchyma. There are a few low-density lesions within the liver, largest near the left hepatic dome measuring 1.5 cm measuring at the upper limits of normal for simple fluid (series 2, image 11). Additional low-density lesions, too small to definitively characterize. Unremarkable appearance of the gallbladder. No hyperdense gallstone. No biliary dilatation.  Pancreas: Unremarkable. No pancreatic ductal dilatation or surrounding inflammatory changes.  Spleen: Normal in size without focal abnormality.  Adrenals/Urinary Tract: Nodular thickening of the left adrenal gland. Right adrenal gland is unremarkable. Ovoid 8 mm stone at the right ureterovesical junction (series 2, image 75). No significant right-sided hydronephrosis, although there is mild right perinephric stranding suggesting a degree of obstruction. Striated right nephrogram on delayed phased imaging. Left kidney appears unremarkable. Urinary bladder is diffusely thick walled, which may be partially accentuated by under distension.  Stomach/Bowel: Stomach is within normal limits. Appendix appears normal (series 2, image 48). Mild-moderate volume of stool throughout the colon. No evidence of bowel wall thickening, distention, or inflammatory changes.  Vascular/Lymphatic: Aortic atherosclerosis. There is greater than 50% luminal narrowing at the origin of the right common iliac artery secondary to calcified and noncalcified atherosclerotic plaque (series 4, image 55). No enlarged abdominal or pelvic lymph nodes.  Reproductive: Prostate gland is upper limits of normal in size.  Other: Fat containing left inguinal hernia. Trace right perinephric fluid. No ascites or or in eyes do abdominopelvic fluid collection. No pneumoperitoneum.  Musculoskeletal: Probable bone island in the parasymphyseal aspect of the  right pubic bone. Degenerative changes within the lumbar spine. No acute osseous findings.  IMPRESSION: 1. Ovoid 8 mm stone at the right ureterovesical junction without significant right-sided hydronephrosis, although there is mild right perinephric stranding and a striated delayed nephrogram suggesting a degree of obstruction. 2. Urinary bladder is diffusely thick-walled, which may be partially accentuated by under distension. Correlate with urinalysis to exclude cystitis. 3. Normal appendix. 4. There is greater than 50% luminal narrowing at the origin of the right common iliac artery secondary to calcified and noncalcified atherosclerotic plaque. Correlate for right lower extremity claudication 5. Fat containing left inguinal hernia. 6. Mildly decreased attenuation of the hepatic parenchyma suggesting hepatic steatosis. 7. There are a few low-density lesions within the liver, largest near the left hepatic dome measuring at the upper limits of normal for simple fluid. There are a few additional subcentimeter low-density lesions, too small to definitively characterize. 8. Aortic atherosclerosis. (ICD10-I70.0).   Electronically Signed   By: Davina Poke D.O.   On: 03/13/2020 11:57   I have personally reviewed the images and agree with radiologist interpretation.     Assessment & Plan:    1.  Right ureteral calculus  -CT A/P on 03/13/2020 revealed ovoid 8 mm stone at the right ureterovesical junction without significant right-sided hydronephrosis   -Will order an KUB to assess current position of stone. Will call with results.  -Based on size and location of the stone, patient  spontaneous interval stone passage less likely. Given that the stone seems to be  moving in location, patient may have a higher chance than the aforementioned. Medical expulsive therapy was discussed as an option.  Rx tamsulosin sent to pharmacy  -We discussed various treatment options  including ESWL vs. ureteroscopy, laser lithotripsy, and stent. We discussed the risks and benefits of both including bleeding, infection, damage to surrounding structures, efficacy with need for possible further intervention, and need for temporary ureteral stent.  -Patient elected to try tamsulosin and kindly declined surgical intervention at this time.   [place] [address] [place], [place] [address] [phone]  I, Selena Batten, am acting as a scribe for Dr. Nicki Reaper C. Alvis Pulcini,  I have reviewed the above documentation for accuracy and completeness, and I agree with the above.   Abbie Sons, MD

## 2020-04-01 ENCOUNTER — Ambulatory Visit (INDEPENDENT_AMBULATORY_CARE_PROVIDER_SITE_OTHER): Payer: Medicare Other | Admitting: Urology

## 2020-04-01 ENCOUNTER — Encounter: Payer: Self-pay | Admitting: Urology

## 2020-04-01 ENCOUNTER — Ambulatory Visit
Admission: RE | Admit: 2020-04-01 | Discharge: 2020-04-01 | Disposition: A | Discharge status: Home / Self Care | Payer: Medicare Other | Source: Ambulatory Visit | Attending: Urology | Admitting: Urology

## 2020-04-01 ENCOUNTER — Other Ambulatory Visit: Payer: Self-pay

## 2020-04-01 ENCOUNTER — Ambulatory Visit
Admission: RE | Admit: 2020-04-01 | Discharge: 2020-04-01 | Disposition: A | Discharge status: Home / Self Care | Payer: Medicare Other | Attending: Urology | Admitting: Urology

## 2020-04-01 VITALS — BP 102/65 | HR 68 | Ht 69.0 in | Wt 165.0 lb

## 2020-04-01 DIAGNOSIS — N201 Calculus of ureter: Secondary | ICD-10-CM

## 2020-04-01 DIAGNOSIS — N200 Calculus of kidney: Secondary | ICD-10-CM | POA: Diagnosis present

## 2020-04-01 DIAGNOSIS — R399 Unspecified symptoms and signs involving the genitourinary system: Secondary | ICD-10-CM

## 2020-04-01 LAB — URINALYSIS, COMPLETE
Bilirubin, UA: NEGATIVE
Glucose, UA: NEGATIVE
Ketones, UA: NEGATIVE
Nitrite, UA: NEGATIVE
Specific Gravity, UA: 1.015 (ref 1.005–1.030)
Urobilinogen, Ur: 2 mg/dL — ABNORMAL HIGH (ref 0.2–1.0)
pH, UA: 7 (ref 5.0–7.5)

## 2020-04-01 LAB — MICROSCOPIC EXAMINATION: WBC, UA: >30 /hpf — AB (ref 0–5)

## 2020-04-01 MED ORDER — TAMSULOSIN HCL 0.4 MG PO CAPS: 0.4000 mg | ORAL_CAPSULE | Freq: Every day | ORAL | 0 refills | Status: DC

## 2020-04-01 NOTE — Addendum Note (Signed)
Addended by: Evelina Bucy on: 04/01/2020 04:20 PM   Modules accepted: Orders

## 2020-04-02 ENCOUNTER — Telehealth: Payer: Self-pay | Admitting: *Deleted

## 2020-04-02 ENCOUNTER — Other Ambulatory Visit: Payer: Self-pay | Admitting: *Deleted

## 2020-04-02 DIAGNOSIS — N201 Calculus of ureter: Secondary | ICD-10-CM

## 2020-04-02 MED ORDER — TAMSULOSIN HCL 0.4 MG PO CAPS: 0.4000 mg | ORAL_CAPSULE | Freq: Every day | ORAL | 0 refills | Status: DC

## 2020-04-02 NOTE — Telephone Encounter (Signed)
-----   Message from Abbie Sons, MD sent at 04/02/2020  1:12 PM EDT ----- Calculus is visualized on KUB.  He was initially going to go with a trial of passage and Rx tamsulosin was sent to pharmacy yesterday.  Recommend a follow-up visit with KUB in 2 weeks

## 2020-04-02 NOTE — Telephone Encounter (Signed)
Notified patient as instructed, patient pleased. Discussed follow-up appointments, patient agrees  

## 2020-04-04 LAB — CULTURE, URINE COMPREHENSIVE

## 2020-04-07 ENCOUNTER — Other Ambulatory Visit: Payer: Self-pay | Admitting: Urology

## 2020-04-07 MED ORDER — CIPROFLOXACIN HCL 250 MG PO TABS: 250.0000 mg | ORAL_TABLET | Freq: Two times a day (BID) | ORAL | 0 refills | Status: AC

## 2020-04-08 ENCOUNTER — Telehealth: Payer: Self-pay | Admitting: *Deleted

## 2020-04-08 NOTE — Telephone Encounter (Signed)
-----   Message from Abbie Sons, MD sent at 04/07/2020 10:27 AM EDT ----- Urine culture did show a low level of bacteria.  Antibiotic Rx was sent to pharmacy.

## 2020-04-08 NOTE — Telephone Encounter (Signed)
Notified patient as instructed, patient pleased. Discussed follow-up appointments, patient agrees  

## 2020-04-18 ENCOUNTER — Other Ambulatory Visit: Payer: Self-pay | Admitting: *Deleted

## 2020-04-18 ENCOUNTER — Ambulatory Visit (INDEPENDENT_AMBULATORY_CARE_PROVIDER_SITE_OTHER): Payer: Medicare Other | Admitting: Urology

## 2020-04-18 ENCOUNTER — Ambulatory Visit
Admission: RE | Admit: 2020-04-18 | Discharge: 2020-04-18 | Disposition: A | Discharge status: Home / Self Care | Payer: Medicare Other | Source: Ambulatory Visit | Attending: Urology | Admitting: Urology

## 2020-04-18 ENCOUNTER — Encounter: Payer: Self-pay | Admitting: Urology

## 2020-04-18 ENCOUNTER — Other Ambulatory Visit: Payer: Self-pay

## 2020-04-18 VITALS — BP 127/76 | HR 66 | Ht 69.0 in | Wt 168.0 lb

## 2020-04-18 DIAGNOSIS — N201 Calculus of ureter: Secondary | ICD-10-CM | POA: Insufficient documentation

## 2020-04-19 ENCOUNTER — Encounter: Payer: Self-pay | Admitting: Urology

## 2020-04-19 NOTE — Progress Notes (Signed)
04/18/2020 8:10 AM   [name] 30-Aug-1951 [id number]  Referring provider: [name], MD [address] [facility] [place],  [place] [address]  Chief Complaint  Patient presents with  . Nephrolithiasis    HPI: 69 y.o. male presents for follow-up of a right ureteral calculus.   8 mm nonobstructing right renal calculus seen on CT  Asymptomatic  Seen 7/12 and initially elected MET  Not aware of passing stone  KUB today shows persistent right pelvic calcification  Size more in line with 5 mm   PMH: Past Medical History:  Diagnosis Date  . CHF (congestive heart failure) ([place])   . Hypertension   . Myocardial infarct [facility])     Surgical History: Past Surgical History:  Procedure Laterality Date  . CARDIAC DEFIBRILLATOR PLACEMENT  feb 2014    Home Medications:  Allergies as of 04/18/2020      Reactions   Mushroom Extract Complex Nausea And Vomiting   Neosporin [neomycin-bacitracin Zn-polymyx] Hives      Medication List       Accurate as of April 18, 2020 11:59 PM. If you have any questions, ask your nurse or doctor.        aspirin EC 325 MG tablet Take 1 tablet by mouth daily.   Benicar 40 MG tablet Generic drug: olmesartan Take 1 tablet by mouth daily.   clopidogrel 75 MG tablet Commonly known as: PLAVIX Take 1 tablet by mouth daily.   Coreg CR 80 MG 24 hr capsule Generic drug: carvedilol Take 1 capsule by mouth daily.   Entresto 24-26 MG Generic drug: sacubitril-valsartan SMARTSIG:1 Tablet(s) By Mouth Every 12 Hours   furosemide 40 MG tablet Commonly known as: LASIX Take 1 tablet by mouth daily.   HYDROcodone-acetaminophen 5-325 MG tablet Commonly known as: NORCO/VICODIN Take 1 tablet by mouth every 4 (four) hours as needed.   montelukast 10 MG tablet Commonly known as: SINGULAIR Take by mouth.   sildenafil 100 MG tablet Commonly known as: VIAGRA Take by mouth.   spironolactone 25 MG tablet Commonly known as:  ALDACTONE Take 1 tablet by mouth daily.   tamsulosin 0.4 MG Caps capsule Commonly known as: FLOMAX Take 1 capsule (0.4 mg total) by mouth daily after breakfast.       Allergies:  Allergies  Allergen Reactions  . Mushroom Extract Complex Nausea And Vomiting  . Neosporin [Neomycin-Bacitracin Zn-Polymyx] Hives    Family History: No family history on file.  Social History:  reports that he has quit smoking. He has never used smokeless tobacco. He reports that he does not drink alcohol and does not use drugs.   Physical Exam: BP 127/76   Pulse 66   Ht 5' 9" (1.753 m)   Wt 168 lb (76.2 kg)   BMI 24.81 kg/m   Constitutional:  Alert and oriented, No acute distress. HEENT: [place] AT, moist mucus membranes.  Trachea midline, no masses. Cardiovascular: No clubbing, cyanosis, or edema. Respiratory: Normal respiratory effort, no increased work of breathing.   Assessment & Plan:    1.  Right distal ureteral calculus  Nonobstructing/asymptomatic  Initially diagnosed 03/13/2020 on CT and unlikely it will pass I again discussed in detail treatment options of shockwave lithotripsy and ureteroscopy.  SWL has a lower stone free rate in a single procedure, but also a lower complication rate compared to ureteroscopy and avoids a stent and associated stent related symptoms. Possible complications include renal hematoma, steinstrasse, and need for additional treatment. Ureteroscopy with laser  lithotripsy and stent placement has a higher stone free rate than SWL in a single procedure, however increased complication rate including possible infection, ureteral injury, bleeding, and stent related morbidity. Common stent related symptoms include dysuria, urgency/frequency, and flank pain. After an extensive discussion of the risks and benefits of the above treatment options, the patient is undecided and would like to take some time to think over.  He does have a significant cardiac history.  He was  informed the procedure could be performed under a regional anesthetic.  Would also need preoperative cardiac evaluation   Abbie Sons, MD  [place] [address] [place], [place] [address] [phone]

## 2020-04-24 ENCOUNTER — Other Ambulatory Visit: Payer: Self-pay | Admitting: Urology

## 2020-04-24 DIAGNOSIS — N201 Calculus of ureter: Secondary | ICD-10-CM

## 2020-07-17 ENCOUNTER — Telehealth: Payer: Self-pay | Admitting: Urology

## 2020-07-17 NOTE — Telephone Encounter (Signed)
Patient seen July 2021 with an 8 mm ureteral calculus.  Please find out if he passed a stone.  If he quit hurting but did not definitely see a stone recommend follow-up KUB

## 2021-08-22 ENCOUNTER — Encounter: Payer: Self-pay | Admitting: Emergency Medicine

## 2021-08-22 ENCOUNTER — Inpatient Hospital Stay
Admission: EM | Admit: 2021-08-22 | Discharge: 2021-08-25 | DRG: 291 | Disposition: A | Discharge status: Home / Self Care | Payer: Medicare HMO | Attending: Internal Medicine | Admitting: Internal Medicine

## 2021-08-22 ENCOUNTER — Other Ambulatory Visit: Payer: Self-pay

## 2021-08-22 ENCOUNTER — Emergency Department: Payer: Medicare HMO

## 2021-08-22 DIAGNOSIS — I420 Dilated cardiomyopathy: Secondary | ICD-10-CM | POA: Diagnosis not present

## 2021-08-22 DIAGNOSIS — I5023 Acute on chronic systolic (congestive) heart failure: Secondary | ICD-10-CM | POA: Diagnosis present

## 2021-08-22 DIAGNOSIS — R011 Cardiac murmur, unspecified: Secondary | ICD-10-CM | POA: Diagnosis present

## 2021-08-22 DIAGNOSIS — I081 Rheumatic disorders of both mitral and tricuspid valves: Secondary | ICD-10-CM | POA: Diagnosis present

## 2021-08-22 DIAGNOSIS — Z91018 Allergy to other foods: Secondary | ICD-10-CM | POA: Diagnosis not present

## 2021-08-22 DIAGNOSIS — N400 Enlarged prostate without lower urinary tract symptoms: Secondary | ICD-10-CM | POA: Diagnosis present

## 2021-08-22 DIAGNOSIS — R778 Other specified abnormalities of plasma proteins: Secondary | ICD-10-CM | POA: Diagnosis present

## 2021-08-22 DIAGNOSIS — N1831 Chronic kidney disease, stage 3a: Secondary | ICD-10-CM | POA: Diagnosis present

## 2021-08-22 DIAGNOSIS — I959 Hypotension, unspecified: Secondary | ICD-10-CM | POA: Diagnosis not present

## 2021-08-22 DIAGNOSIS — Z888 Allergy status to other drugs, medicaments and biological substances status: Secondary | ICD-10-CM | POA: Diagnosis not present

## 2021-08-22 DIAGNOSIS — I2510 Atherosclerotic heart disease of native coronary artery without angina pectoris: Secondary | ICD-10-CM | POA: Diagnosis present

## 2021-08-22 DIAGNOSIS — I255 Ischemic cardiomyopathy: Secondary | ICD-10-CM | POA: Diagnosis present

## 2021-08-22 DIAGNOSIS — Z79899 Other long term (current) drug therapy: Secondary | ICD-10-CM | POA: Diagnosis not present

## 2021-08-22 DIAGNOSIS — I5022 Chronic systolic (congestive) heart failure: Secondary | ICD-10-CM | POA: Diagnosis present

## 2021-08-22 DIAGNOSIS — Z7982 Long term (current) use of aspirin: Secondary | ICD-10-CM | POA: Diagnosis not present

## 2021-08-22 DIAGNOSIS — E875 Hyperkalemia: Secondary | ICD-10-CM | POA: Diagnosis present

## 2021-08-22 DIAGNOSIS — Z87891 Personal history of nicotine dependence: Secondary | ICD-10-CM

## 2021-08-22 DIAGNOSIS — I4891 Unspecified atrial fibrillation: Secondary | ICD-10-CM | POA: Diagnosis present

## 2021-08-22 DIAGNOSIS — N179 Acute kidney failure, unspecified: Secondary | ICD-10-CM | POA: Diagnosis not present

## 2021-08-22 DIAGNOSIS — I429 Cardiomyopathy, unspecified: Secondary | ICD-10-CM | POA: Diagnosis present

## 2021-08-22 DIAGNOSIS — Z66 Do not resuscitate: Secondary | ICD-10-CM | POA: Diagnosis present

## 2021-08-22 DIAGNOSIS — Y92239 Unspecified place in hospital as the place of occurrence of the external cause: Secondary | ICD-10-CM | POA: Diagnosis not present

## 2021-08-22 DIAGNOSIS — Z20822 Contact with and (suspected) exposure to covid-19: Secondary | ICD-10-CM | POA: Diagnosis present

## 2021-08-22 DIAGNOSIS — I252 Old myocardial infarction: Secondary | ICD-10-CM | POA: Diagnosis not present

## 2021-08-22 DIAGNOSIS — Z7902 Long term (current) use of antithrombotics/antiplatelets: Secondary | ICD-10-CM

## 2021-08-22 DIAGNOSIS — Z95810 Presence of automatic (implantable) cardiac defibrillator: Secondary | ICD-10-CM

## 2021-08-22 DIAGNOSIS — I5043 Acute on chronic combined systolic (congestive) and diastolic (congestive) heart failure: Secondary | ICD-10-CM | POA: Diagnosis present

## 2021-08-22 DIAGNOSIS — I509 Heart failure, unspecified: Secondary | ICD-10-CM

## 2021-08-22 DIAGNOSIS — R7989 Other specified abnormal findings of blood chemistry: Secondary | ICD-10-CM

## 2021-08-22 DIAGNOSIS — J45909 Unspecified asthma, uncomplicated: Secondary | ICD-10-CM | POA: Diagnosis present

## 2021-08-22 DIAGNOSIS — E782 Mixed hyperlipidemia: Secondary | ICD-10-CM | POA: Insufficient documentation

## 2021-08-22 DIAGNOSIS — I130 Hypertensive heart and chronic kidney disease with heart failure and stage 1 through stage 4 chronic kidney disease, or unspecified chronic kidney disease: Secondary | ICD-10-CM | POA: Diagnosis present

## 2021-08-22 DIAGNOSIS — E785 Hyperlipidemia, unspecified: Secondary | ICD-10-CM | POA: Diagnosis present

## 2021-08-22 DIAGNOSIS — T501X5A Adverse effect of loop [high-ceiling] diuretics, initial encounter: Secondary | ICD-10-CM | POA: Diagnosis not present

## 2021-08-22 DIAGNOSIS — Z955 Presence of coronary angioplasty implant and graft: Secondary | ICD-10-CM

## 2021-08-22 LAB — CBC
HCT: 46 % (ref 39.0–52.0)
Hemoglobin: 15 g/dL (ref 13.0–17.0)
MCH: 30.4 pg (ref 26.0–34.0)
MCHC: 32.6 g/dL (ref 30.0–36.0)
MCV: 93.1 fL (ref 80.0–100.0)
Platelets: 351 10*3/uL (ref 150–400)
RBC: 4.94 MIL/uL (ref 4.22–5.81)
RDW: 16.2 % — ABNORMAL HIGH (ref 11.5–15.5)
WBC: 6.9 10*3/uL (ref 4.0–10.5)
nRBC: 0 % (ref 0.0–0.2)

## 2021-08-22 LAB — BASIC METABOLIC PANEL
Anion gap: 5 (ref 5–15)
BUN: 28 mg/dL — ABNORMAL HIGH (ref 8–23)
CO2: 26 mmol/L (ref 22–32)
Calcium: 8.8 mg/dL — ABNORMAL LOW (ref 8.9–10.3)
Chloride: 105 mmol/L (ref 98–111)
Creatinine, Ser: 1.52 mg/dL — ABNORMAL HIGH (ref 0.61–1.24)
GFR, Estimated: 49 mL/min — ABNORMAL LOW (ref >60)
Glucose, Bld: 110 mg/dL — ABNORMAL HIGH (ref 70–99)
Potassium: 3.8 mmol/L (ref 3.5–5.1)
Sodium: 136 mmol/L (ref 135–145)

## 2021-08-22 LAB — RESP PANEL BY RT-PCR (FLU A&B, COVID) ARPGX2
Influenza A by PCR: NEGATIVE
Influenza B by PCR: NEGATIVE
SARS Coronavirus 2 by RT PCR: NEGATIVE

## 2021-08-22 LAB — MAGNESIUM: Magnesium: 2.1 mg/dL (ref 1.7–2.4)

## 2021-08-22 LAB — TROPONIN I (HIGH SENSITIVITY)
Troponin I (High Sensitivity): 22 ng/L — ABNORMAL HIGH (ref <18)
Troponin I (High Sensitivity): 24 ng/L — ABNORMAL HIGH (ref <18)

## 2021-08-22 LAB — BRAIN NATRIURETIC PEPTIDE: B Natriuretic Peptide: 1593.4 pg/mL — ABNORMAL HIGH (ref 0.0–100.0)

## 2021-08-22 MED ORDER — FUROSEMIDE 10 MG/ML IJ SOLN
80.0000 mg | Freq: Once | INTRAMUSCULAR | Status: AC
  Administered 2021-08-22: 80 mg via INTRAVENOUS
  Filled 2021-08-22: qty 8

## 2021-08-22 MED ORDER — MONTELUKAST SODIUM 10 MG PO TABS
10.0000 mg | ORAL_TABLET | Freq: Every day | ORAL | Status: DC
  Administered 2021-08-22 – 2021-08-23 (×2): 10 mg via ORAL
  Filled 2021-08-22 (×3): qty 1

## 2021-08-22 MED ORDER — POTASSIUM CHLORIDE CRYS ER 20 MEQ PO TBCR
20.0000 meq | EXTENDED_RELEASE_TABLET | Freq: Two times a day (BID) | ORAL | Status: DC
  Administered 2021-08-22 – 2021-08-24 (×5): 20 meq via ORAL
  Filled 2021-08-22: qty 1
  Filled 2021-08-22: qty 2
  Filled 2021-08-22: qty 1
  Filled 2021-08-22: qty 2
  Filled 2021-08-22: qty 1
  Filled 2021-08-22: qty 2

## 2021-08-22 MED ORDER — OXYCODONE HCL 5 MG PO TABS: 5.0000 mg | ORAL_TABLET | ORAL | Status: DC | PRN

## 2021-08-22 MED ORDER — POTASSIUM CHLORIDE CRYS ER 20 MEQ PO TBCR
40.0000 meq | EXTENDED_RELEASE_TABLET | Freq: Once | ORAL | Status: AC
  Administered 2021-08-22: 40 meq via ORAL
  Filled 2021-08-22: qty 2

## 2021-08-22 MED ORDER — FUROSEMIDE 10 MG/ML IJ SOLN
40.0000 mg | Freq: Two times a day (BID) | INTRAMUSCULAR | Status: DC
  Administered 2021-08-22 – 2021-08-23 (×2): 40 mg via INTRAVENOUS
  Filled 2021-08-22 (×3): qty 4

## 2021-08-22 MED ORDER — ONDANSETRON HCL 4 MG/2ML IJ SOLN
4.0000 mg | Freq: Four times a day (QID) | INTRAMUSCULAR | Status: DC | PRN
  Administered 2021-08-22 – 2021-08-24 (×3): 4 mg via INTRAVENOUS
  Filled 2021-08-22 (×3): qty 2

## 2021-08-22 MED ORDER — ONDANSETRON HCL 4 MG PO TABS: 4.0000 mg | ORAL_TABLET | Freq: Four times a day (QID) | ORAL | Status: DC | PRN

## 2021-08-22 MED ORDER — FLUTICASONE PROPIONATE 50 MCG/ACT NA SUSP
2.0000 | Freq: Every day | NASAL | Status: DC | PRN
  Filled 2021-08-22: qty 16

## 2021-08-22 MED ORDER — TRAZODONE HCL 50 MG PO TABS: 50.0000 mg | ORAL_TABLET | Freq: Every evening | ORAL | Status: DC | PRN

## 2021-08-22 MED ORDER — SPIRONOLACTONE 25 MG PO TABS
25.0000 mg | ORAL_TABLET | Freq: Every day | ORAL | Status: DC
  Administered 2021-08-22: 25 mg via ORAL
  Filled 2021-08-22: qty 1

## 2021-08-22 MED ORDER — DILTIAZEM HCL-DEXTROSE 125-5 MG/125ML-% IV SOLN (PREMIX)
5.0000 mg/h | INTRAVENOUS | Status: DC
  Administered 2021-08-22: 5 mg/h via INTRAVENOUS
  Filled 2021-08-22: qty 125

## 2021-08-22 MED ORDER — ACETAMINOPHEN 650 MG RE SUPP
650.0000 mg | Freq: Four times a day (QID) | RECTAL | Status: DC | PRN
  Filled 2021-08-22: qty 1

## 2021-08-22 MED ORDER — TAMSULOSIN HCL 0.4 MG PO CAPS
0.4000 mg | ORAL_CAPSULE | Freq: Every day | ORAL | Status: DC
  Administered 2021-08-24 – 2021-08-25 (×2): 0.4 mg via ORAL
  Filled 2021-08-22 (×3): qty 1

## 2021-08-22 MED ORDER — POLYETHYLENE GLYCOL 3350 17 G PO PACK: 17.0000 g | PACK | Freq: Every day | ORAL | Status: DC | PRN

## 2021-08-22 MED ORDER — ENOXAPARIN SODIUM 80 MG/0.8ML IJ SOSY
1.0000 mg/kg | PREFILLED_SYRINGE | Freq: Two times a day (BID) | INTRAMUSCULAR | Status: DC
Start: 2021-08-22 — End: 2021-08-23
  Administered 2021-08-23: 80 mg via SUBCUTANEOUS
  Filled 2021-08-22 (×3): qty 0.8

## 2021-08-22 MED ORDER — ACETAMINOPHEN 325 MG PO TABS: 650.0000 mg | ORAL_TABLET | Freq: Four times a day (QID) | ORAL | Status: DC | PRN

## 2021-08-22 MED ORDER — ALBUTEROL SULFATE (2.5 MG/3ML) 0.083% IN NEBU: 3.0000 mL | INHALATION_SOLUTION | Freq: Four times a day (QID) | RESPIRATORY_TRACT | Status: DC | PRN

## 2021-08-22 MED ORDER — DILTIAZEM LOAD VIA INFUSION
10.0000 mg | Freq: Once | INTRAVENOUS | Status: AC
  Administered 2021-08-22: 10 mg via INTRAVENOUS
  Filled 2021-08-22: qty 10

## 2021-08-22 MED ORDER — SACUBITRIL-VALSARTAN 24-26 MG PO TABS
1.0000 | ORAL_TABLET | Freq: Two times a day (BID) | ORAL | Status: DC
  Administered 2021-08-22 – 2021-08-24 (×3): 1 via ORAL
  Filled 2021-08-22 (×5): qty 1

## 2021-08-22 MED ORDER — SODIUM CHLORIDE 0.9% FLUSH
3.0000 mL | Freq: Two times a day (BID) | INTRAVENOUS | Status: DC
  Administered 2021-08-22 – 2021-08-25 (×5): 3 mL via INTRAVENOUS

## 2021-08-22 NOTE — ED Notes (Addendum)
Pt cardiac monitor showing 13 runs of VTACH. Report printed and placed in pt's box. Pt denies any complaints at this time

## 2021-08-22 NOTE — ED Notes (Signed)
Noted room assignment revoked at this time

## 2021-08-22 NOTE — ED Notes (Signed)
On cardiac monitor pt having 6-8 runs of VTACH. Pt with no new complaints or worsening symptoms. EKG printed and given to EDP.

## 2021-08-22 NOTE — ED Triage Notes (Signed)
Pt to ED from home c/o SOB that started around 2100 last night and worse when lying flat tonight.  Denies cough, denies pain.  Hx CHF.  Pt A&Ox4, chest rise even and unlabored, skin WNL and in NAD at this time.

## 2021-08-22 NOTE — ED Notes (Signed)
Pt assisted to the bathroom at this time.

## 2021-08-22 NOTE — ED Notes (Signed)
Cardiologist at bedside.  

## 2021-08-22 NOTE — H&P (Signed)
[facility] History and Physical  [name] IRW:[id number] DOB: 1951/06/12 DOA: 08/22/2021  Referring physician: Dr. Tamala Julian PCP: [name], MD   Chief Complaint: shortness of breath  HPI: Isaac [name] is a 70 y.o. male with hx of heart failure with reduced ejection fraction, CAD status post PCI, hypertension, hyperlipidemia, who presents with shortness of breath.  Patient with long history of CAD, status post PCI and stent in 2007 to the obtuse marginal in the RCA in 2013.  Also diagnosed with heart failure in 2014, at that time EF was 15 to 20% and a defibrillator was implanted.  Last saw his cardiologist on July 09, 2021, at that time reported that he was doing well.  Subsequently saw his PCP for an episode of acute gout at the beginning of November and was treated with steroids, saw his PCP again on November 30 at which time he reported increasing dyspnea and lower extremity edema.  Today patient presents reporting persistently worsening shortness of breath and leg swelling.  He reports that this is worsened over approximately the past 2 weeks.  He reports he can only take about 10 steps before he becomes very short of breath and has to rest.  He denies having missed any of his medications, has not had any dietary indiscretions (he reports he is a vegetarian and tries to eat very healthy), he denies chest pain, he denies palpitations.  Besides his recent episode of presumed gout he has not had any episodes of illness.  In the ED initial vital signs notable for tachycardia to the 160s and intermittent low normal O2 sats down to 90%, remainder vital signs unremarkable.  Lab work-up notable for creatinine of 1.5 which is at his baseline, normal magnesium, unremarkable CBC, BNP elevated at 1593, and mildly persistently elevated troponins that trended 22>24. Chest x-ray showed new left-sided pleural effusion and findings consistent with pulmonary edema. ECG showed new Afib  with RVR but no new ischemic changes compared to prior.  Patient was started on IV diltiazem for his A. fib with RVR, given 80 mg of IV Lasix, p.o. potassium supplementation, and admitted for further management.  Review of Systems:  Pertinent positives and negative per HPI, all others reviewed and negative  Past Medical History:  Diagnosis Date   CHF (congestive heart failure) ([place])    Hypertension    Myocardial infarct [facility])    Past Surgical History:  Procedure Laterality Date   CARDIAC DEFIBRILLATOR PLACEMENT  feb 2014   Social History:  reports that he has quit smoking. He has never used smokeless tobacco. He reports that he does not drink alcohol and does not use drugs.  Allergies  Allergen Reactions   Mushroom Extract Complex Nausea And Vomiting   Neosporin [Neomycin-Bacitracin Zn-Polymyx] Hives    History reviewed. No pertinent family history.   Prior to Admission medications   Medication Sig Start Date End Date Taking? Authorizing Provider  aspirin EC 325 MG tablet Take 1 tablet by mouth daily. 11/06/15   [provider]  carvedilol (COREG CR) 80 MG 24 hr capsule Take 1 capsule by mouth daily. 11/06/15   [provider]  clopidogrel (PLAVIX) 75 MG tablet Take 1 tablet by mouth daily. 09/27/15   [provider]  ENTRESTO 24-26 MG SMARTSIG:1 Tablet(s) By Mouth Every 12 Hours 12/27/19   [provider]  furosemide (LASIX) 40 MG tablet Take 1 tablet by mouth daily. 09/27/15   [provider]  HYDROcodone-acetaminophen (NORCO/VICODIN) 5-325 MG tablet  Take 1 tablet by mouth every 4 (four) hours as needed. 01/02/16   Shary Decamp, PA-C  montelukast (SINGULAIR) 10 MG tablet Take by mouth. 10/23/19 10/22/20  [provider]  olmesartan (BENICAR) 40 MG tablet Take 1 tablet by mouth daily. 11/06/15   [provider]  sildenafil (VIAGRA) 100 MG tablet Take by mouth. 10/23/19   [provider]  spironolactone (ALDACTONE) 25 MG  tablet Take 1 tablet by mouth daily. 11/06/15   [provider]  tamsulosin (FLOMAX) 0.4 MG CAPS capsule TAKE 1 CAPSULE (0.4 MG TOTAL) BY MOUTH DAILY AFTER BREAKFAST. 04/24/20   Nori Riis, PA-C   Physical Exam: Vitals:   08/22/21 0506 08/22/21 1201 08/22/21 1239 08/22/21 1244  BP:  116/85    Pulse:  (!) 161 (!) 58 (!) 115  Resp:  16 (!) 25 20  Temp:  98.3 F (36.8 C)    TempSrc:  Oral    SpO2:  99% 90%   Weight: 79.4 kg     Height: 5\' 9"  (1.753 m)       Wt Readings from Last 3 Encounters:  08/22/21 79.4 kg  04/18/20 76.2 kg  04/01/20 74.8 kg     General:  Appears calm and comfortable Eyes: PERRL, normal lids, irises & conjunctiva ENT: grossly normal hearing, lips & tongue Neck: no LAD, masses or thyromegaly Cardiovascular: Tachycardic, irregularly irregular, harsh 2 out of 6 systolic murmur best heard at the apex, JVD elevated with patient sitting straight up to approximately 4 cm above the clavicle, lower extremity edema 1+ to bilateral mid shin, unable to further assess due to his clothes. Telemetry: Tachycardic, atrial fibrillation, frequent runs of asymptomatic V. tach Respiratory: Normal respiratory effort, decreased breath sounds at the left lung base, no wheezes or rales appreciated Abdomen: soft, ntnd Skin: no rash or induration seen on limited exam Musculoskeletal: grossly normal tone BUE/BLE Psychiatric: grossly normal mood and affect, speech fluent and appropriate Neurologic: grossly non-focal.          Labs on Admission:  Basic Metabolic Panel: Recent Labs  Lab 08/22/21 0510 08/22/21 1209  NA 136  --   K 3.8  --   CL 105  --   CO2 26  --   GLUCOSE 110*  --   BUN 28*  --   CREATININE 1.52*  --   CALCIUM 8.8*  --   MG  --  2.1   Liver Function Tests: No results for input(s): AST, ALT, ALKPHOS, BILITOT, PROT, ALBUMIN in the last 168 hours. No results for input(s): LIPASE, AMYLASE in the last 168 hours. No results for input(s): AMMONIA  in the last 168 hours. CBC: Recent Labs  Lab 08/22/21 0510  WBC 6.9  HGB 15.0  HCT 46.0  MCV 93.1  PLT 351   Cardiac Enzymes: No results for input(s): CKTOTAL, CKMB, CKMBINDEX, TROPONINI in the last 168 hours.  BNP (last 3 results) Recent Labs    08/22/21 0510  BNP 1,593.4*    ProBNP (last 3 results) No results for input(s): PROBNP in the last 8760 hours.  CBG: No results for input(s): GLUCAP in the last 168 hours.  Radiological Exams on Admission: DG Chest 2 View  Result Date: 08/22/2021 CLINICAL DATA:  70 year old male with shortness of breath since 20/1 100 hours, worse when lying flat. EXAM: CHEST - 2 VIEW COMPARISON:  Chest radiograph 03/03/2020 and earlier. FINDINGS: Stable left chest AICD. Stable cardiac size at the upper limits of normal. Other mediastinal contours are  within normal limits. Visualized tracheal air column is within normal limits. Confluent new veiling opacity at the left lung base most resembles a small to moderate pleural effusion on the lateral view. No definite air bronchograms. Increased pulmonary interstitial opacity, favor vascular congestion. No pneumothorax. Right lung otherwise clear. No acute osseous abnormality identified. Negative visible bowel gas. IMPRESSION: New small to moderate left pleural effusion and increased pulmonary vascular congestion compatible with mild interstitial edema. Electronically Signed   By: Genevie Ann M.D.   On: 08/22/2021 05:47    EKG: Independently reviewed.  Rhythm is A. fib with RVR with ventricular rate of 107.  Nonspecific QRS widening is noted.  Several nonsustained PVCs noted.  Compared to prior there are no significant changes.  Assessment/Plan Active Problems:   Acute on chronic systolic CHF (congestive heart failure), NYHA class 3 (HCC)   CAD (coronary artery disease)   Cardiomyopathy (HCC)   A-fib (HCC)   Elevated troponin   Myrick Mcnairy is a 70 y.o. male with hx of heart failure with reduced  ejection fraction, CAD status post PCI, hypertension, hyperlipidemia, who presents with shortness of breath and is found to be in acute on chronic CHF exacerbation in addition to having newly found atrial fibrillation.  #Acute on chronic systolic heart failure Patient presenting with progressive dyspnea, lower extremity edema, elevated JVD, elevated BNP, and mild troponinemia all consistent with acute on chronic systolic CHF exacerbation.  Unclear precipitating factor, is newly in A. fib which may have precipitated this, though vice versa is also possible.  In addition also has a new murmur so we will need to get an echo to rule out new valvular etiology.  Cardiology previously consulted by ED provider, will follow up on the recommendations. - Lasix 40 mg IV twice daily - Continuous telemetry - Maintain K greater than 4, magnesium greater than 2 - Daily weights - Strict I&O - P.o. potassium 20 mEq twice daily - Follow-up cardiology recommendations - Continue Entresto, spironolactone - Not on beta-blocker, unclear why - Not on statin per patient preference according to last cardiology note - Consult transitions of care team  #Atrial fibrillation #Systolic murmur Diagnosis is noted in chart from several years ago but there is no clinician documentation on the issue that I can see.  As far as patient knows he has never been diagnosed with atrial fibrillation.  In addition patient is noted to have a new systolic murmur on exam.  CHA2DS2-VASc score of 4, discussed with patient and he is amenable with anticoagulation.  Given presence of new murmur and unclear etiology of his A. fib will defer initiation of DOAC at this time and started on Lovenox instead.  Patient was started on diltiazem drip, plan to consult cardiology and discuss whether low-dose beta-blocker might be better in setting of his significantly impaired EF.  Unclear why he is not already on a beta-blocker. - Start Lovenox 1 mg/kg twice  daily - Hold aspirin in setting of therapeutic anticoagulation - Follow-up results of TTE - Discussed optimal method for rate control with cardiology - Referral to ambulatory A. fib clinic  #Elevated troponin Mild troponin elevation, no chest pain and no EKG changes, likely secondary to demand ischemia in setting of volume overload, no need to trend troponin further at this point unless recommended by cardiology.  #Chronic medical problems Asthma/allergies-continue albuterol as needed, daily fluticasone, montelukast  BPH-continue Flomax  Code Status: DNR/DNI, confirmed with patient DVT Prophylaxis: On full dose anticoagulation Family Communication: None  Disposition Plan: Inpatient, MedSurg, cardiac telemetry  Time spent: 63 min  Clarnce Flock MD/MP[facility]  Note:  This document was prepared using Set designer software and may include unintentional dictation errors.

## 2021-08-22 NOTE — ED Provider Notes (Signed)
[facility] Emergency Department Provider Note  ____________________________________________   Event Date/Time   First MD Initiated Contact with Patient 08/22/21 1200     (approximate)  I have reviewed the triage vital signs and the nursing notes.   HISTORY  Chief Complaint Shortness of Breath   HPI Isaac [name] is a 70 y.o. male with a past medical history of CHF (EF 15-20% on last echo in 2021) s/p defibrilator placement 2014, CAD s/p PIC and stent placement RCA 2013, and gout who presents for assessment of worsening shortness of breath and orthopnea.  Patient states he feels like his shortness of breath started last night.  He has been compliant with all his medications except did not take them this morning.  States he feels it is particularly worse whenever he lays flat feels he cannot sleep.  He has noticed increased welling in his legs but is not sure if he has gained any weight.  No cough, fevers, chest pain, abdominal pain patient states he did have some vomiting yesterday.  Denies any diarrhea, rash, burning with urination, injuries or other acute complaints.  Denies EtOH use or illicit drug use.  No other acute concerns at this time.  He denies any history of A. fib.        Past Medical History:  Diagnosis Date   CHF (congestive heart failure) ([place])    Hypertension    Myocardial infarct [facility])     Patient Active Problem List   Diagnosis Date Noted   CAD (coronary artery disease) 08/22/2021   Cardiomyopathy ([place]) 08/22/2021   Mixed hyperlipidemia 08/22/2021   A-fib ([place]) 08/22/2021   Elevated troponin 08/22/2021   Benign essential hypertension 02/22/2015   Acute on chronic systolic CHF (congestive heart failure), NYHA class 3 ([place]) 07/05/2014     Past Surgical History:  Procedure Laterality Date   CARDIAC DEFIBRILLATOR PLACEMENT  feb 2014    Prior to Admission medications   Medication Sig Start Date End Date Taking? Authorizing  Provider  aspirin EC 325 MG tablet Take 1 tablet by mouth daily. 11/06/15   [provider]  carvedilol (COREG CR) 80 MG 24 hr capsule Take 1 capsule by mouth daily. 11/06/15   [provider]  clopidogrel (PLAVIX) 75 MG tablet Take 1 tablet by mouth daily. 09/27/15   [provider]  ENTRESTO 24-26 MG SMARTSIG:1 Tablet(s) By Mouth Every 12 Hours 12/27/19   [provider]  furosemide (LASIX) 40 MG tablet Take 1 tablet by mouth daily. 09/27/15   [provider]  HYDROcodone-acetaminophen (NORCO/VICODIN) 5-325 MG tablet Take 1 tablet by mouth every 4 (four) hours as needed. 01/02/16   Shary Decamp, PA-C  montelukast (SINGULAIR) 10 MG tablet Take by mouth. 10/23/19 10/22/20  [provider]  olmesartan (BENICAR) 40 MG tablet Take 1 tablet by mouth daily. 11/06/15   [provider]  sildenafil (VIAGRA) 100 MG tablet Take by mouth. 10/23/19   [provider]  spironolactone (ALDACTONE) 25 MG tablet Take 1 tablet by mouth daily. 11/06/15   [provider]  tamsulosin (FLOMAX) 0.4 MG CAPS capsule TAKE 1 CAPSULE (0.4 MG TOTAL) BY MOUTH DAILY AFTER BREAKFAST. 04/24/20   McGowan, Larene [place] A, PA-C    Allergies Mushroom extract complex and Neosporin [neomycin-bacitracin zn-polymyx]  History reviewed. No pertinent family history.  Social History Social History   Tobacco Use   Smoking status: Former   Smokeless tobacco: Never  Substance Use Topics   Alcohol use: No  Drug use: No    Review of Systems  Review of Systems  Constitutional:  Negative for chills and fever.  HENT:  Negative for sore throat.   Eyes:  Negative for pain.  Respiratory:  Positive for shortness of breath. Negative for cough and stridor.   Cardiovascular:  Positive for orthopnea. Negative for chest pain.  Gastrointestinal:  Positive for vomiting (yesterday).  Genitourinary:  Negative for dysuria.  Musculoskeletal:  Negative for myalgias.  Skin:  Negative for  rash.  Neurological:  Negative for seizures, loss of consciousness and headaches.  Psychiatric/Behavioral:  Negative for suicidal ideas.   All other systems reviewed and are negative.    ____________________________________________   PHYSICAL EXAM:  VITAL SIGNS: ED Triage Vitals [08/22/21 0506]  Enc Vitals Group     BP      Pulse      Resp      Temp      Temp src      SpO2      Weight 175 lb (79.4 kg)     Height 5\' 9"  (1.753 m)     Head Circumference      Peak Flow      Pain Score 0     Pain Loc      Pain Edu?      Excl. in [place]?    Vitals:   08/22/21 1300 08/22/21 1315  BP:    Pulse: (!) 113   Resp: (!) 24 20  Temp:    SpO2:     Physical Exam Vitals and nursing note reviewed.  Constitutional:      General: He is not in acute distress.    Appearance: He is well-developed.  HENT:     Head: Normocephalic and atraumatic.     Right Ear: External ear normal.     Left Ear: External ear normal.  Eyes:     Conjunctiva/sclera: Conjunctivae normal.  Cardiovascular:     Rate and Rhythm: Tachycardia present. Rhythm irregular.     Heart sounds: No murmur heard. Pulmonary:     Effort: Pulmonary effort is normal. No respiratory distress.     Breath sounds: Normal breath sounds.  Abdominal:     Palpations: Abdomen is soft.     Tenderness: There is no abdominal tenderness.  Musculoskeletal:        General: No swelling.     Cervical back: Neck supple.     Right lower leg: Edema present.     Left lower leg: Edema present.  Skin:    General: Skin is warm and dry.     Capillary Refill: Capillary refill takes less than 2 seconds.  Neurological:     Mental Status: He is alert.  Psychiatric:        Mood and Affect: Mood normal.     ____________________________________________   LABS (all labs ordered are listed, but only abnormal results are displayed)  Labs Reviewed  CBC - Abnormal; Notable for the following components:      Result Value   RDW 16.2 (*)    All  other components within normal limits  BASIC METABOLIC PANEL - Abnormal; Notable for the following components:   Glucose, Bld 110 (*)    BUN 28 (*)    Creatinine, Ser 1.52 (*)    Calcium 8.8 (*)    GFR, Estimated 49 (*)    All other components within normal limits  BRAIN NATRIURETIC PEPTIDE - Abnormal; Notable for the following components:   B Natriuretic Peptide  1,593.4 (*)    All other components within normal limits  TROPONIN I (HIGH SENSITIVITY) - Abnormal; Notable for the following components:   Troponin I (High Sensitivity) 22 (*)    All other components within normal limits  TROPONIN I (HIGH SENSITIVITY) - Abnormal; Notable for the following components:   Troponin I (High Sensitivity) 24 (*)    All other components within normal limits  RESP PANEL BY RT-PCR (FLU A&B, COVID) ARPGX2  MAGNESIUM   ____________________________________________  EKG  ECG remarkable for A. fib with a ventricular rate of 107, otherwise unremarkable intervals with nonspecific ST changes and inferior and lateral leads without other clear evidence of acute ischemia or significant arrhythmia. ____________________________________________  RADIOLOGY  ED MD interpretation: Chest x-ray shows small to moderate left pleural effusion and pulmonary edema.  There is no clear focal consolidation, pneumothorax or other clear acute thoracic process.  Official radiology report(s): DG Chest 2 View  Result Date: 08/22/2021 CLINICAL DATA:  70 year old male with shortness of breath since 20/1 100 hours, worse when lying flat. EXAM: CHEST - 2 VIEW COMPARISON:  Chest radiograph 03/03/2020 and earlier. FINDINGS: Stable left chest AICD. Stable cardiac size at the upper limits of normal. Other mediastinal contours are within normal limits. Visualized tracheal air column is within normal limits. Confluent new veiling opacity at the left lung base most resembles a small to moderate pleural effusion on the lateral view. No  definite air bronchograms. Increased pulmonary interstitial opacity, favor vascular congestion. No pneumothorax. Right lung otherwise clear. No acute osseous abnormality identified. Negative visible bowel gas. IMPRESSION: New small to moderate left pleural effusion and increased pulmonary vascular congestion compatible with mild interstitial edema. Electronically Signed   By: Genevie Ann M.D.   On: 08/22/2021 05:47    ____________________________________________   PROCEDURES  Procedure(s) performed (including Critical Care):  .1-3 Lead EKG Interpretation Performed by: Lucrezia Starch, MD Authorized by: Lucrezia Starch, MD     Interpretation: non-specific     ECG rate assessment: tachycardic     Rhythm: atrial fibrillation     Ectopy: none     Conduction: normal   .Critical Care E&M Performed by: Lucrezia Starch, MD  Critical care provider statement:    Critical care time (minutes):  45   Critical care time was exclusive of:  Separately billable procedures and treating other patients   Critical care was necessary to treat or prevent imminent or life-threatening deterioration of the following conditions:  Circulatory failure   Critical care was time spent personally by me on the following activities:  Development of treatment plan with patient or surrogate, ordering and performing treatments and interventions, ordering and review of laboratory studies, ordering and review of radiographic studies, pulse oximetry, re-evaluation of patient's condition, review of old charts, examination of patient and evaluation of patient's response to treatment After initial E/M assessment, critical care services were subsequently performed that were exclusive of separately billable procedures or treatment.     ____________________________________________   INITIAL IMPRESSION / ASSESSMENT AND PLAN / ED COURSE      Patient presents with above to history exam for assessment of some orthopnea and  worsening shortness of breath seem to get particular worse last night.  States he also vomited yesterday.  On arrival patient is noted to be tachycardic with rates jumping between the 1 teens to 140s.  Otherwise with stable vital signs on room air.  He does appear volume overloaded on exam but otherwise his belly is soft  and nontender throughout and he is denying any other GI symptoms or vomiting today.  With regard to her shortness of breath I suspect likely acute CHF exacerbation given his exam.  This supported by Chest x-ray that shows small to moderate left pleural effusion and pulmonary edema.  There is no clear focal consolidation, pneumothorax or other clear acute thoracic process.  In addition patient's BNP is 1593.  ECG remarkable for A. fib with a ventricular rate of 107, otherwise unremarkable intervals with nonspecific ST changes and inferior and lateral leads without other clear evidence of acute ischemia or significant arrhythmia.  Troponin minimally elevated 22 and 24 which I suspect represents mild demand ischemia with lower suspicion for OMI.  BMP without significant electrolyte metabolic derangements.  CBC without leukocytosis or significant anemia.  90 seems within normal limits.  Patient started on diltiazem and given IV Lasix with concerns for A. fib with RVR and acute CHF exacerbation.  Patient denies any history of A. fib although there is a remote mention of this on records from outside hospital from 2021.  Patient is not anticoagulated and is not on a rate control medication otherwise as he states he is not on any Coreg.  Discussed this with on-call cardiologist Dr. Clayborn Bigness who is no additional recommendations at this time I would follow once consulted by hospitalist.  I will admit to hospital service for further evaluation and management.       ____________________________________________   FINAL CLINICAL IMPRESSION(S) / ED DIAGNOSES  Final diagnoses:  Atrial  fibrillation with RVR (HCC)  Acute on chronic congestive heart failure, unspecified heart failure type (HCC)    Medications  diltiazem (CARDIZEM) 1 mg/mL load via infusion 10 mg (10 mg Intravenous Bolus from Bag 08/22/21 1235)    And  diltiazem (CARDIZEM) 125 mg in dextrose 5% 125 mL (1 mg/mL) infusion (5 mg/hr Intravenous New Bag/Given 08/22/21 1236)  furosemide (LASIX) injection 80 mg (80 mg Intravenous Given 08/22/21 1230)  potassium chloride SA (KLOR-CON M) CR tablet 40 mEq (40 mEq Oral Given 08/22/21 1229)     ED Discharge Orders     None        Note:  This document was prepared using Dragon voice recognition software and may include unintentional dictation errors.    Lucrezia Starch, MD 08/22/21 1322

## 2021-08-22 NOTE — ED Notes (Signed)
Cardiac monitoring showing 10 runs of VTACH. Report printed and placed in pt's box. Pt denies any complaints at this time.

## 2021-08-22 NOTE — Consult Note (Incomplete)
CARDIOLOGY CONSULT NOTE               Patient ID: Isaac [name] MRN: [id number] DOB/AGE: Apr 24, 1951 70 y.o.  Admit date: 08/22/2021 Referring Physician *** Primary Physician *** Primary Cardiologist *** Reason for Consultation ***  HPI: ***  Review of systems complete and found to be negative unless listed above     Past Medical History:  Diagnosis Date   CHF (congestive heart failure) ([place])    Hypertension    Myocardial infarct ([place])     Past Surgical History:  Procedure Laterality Date   CARDIAC DEFIBRILLATOR PLACEMENT  feb 2014    (Not in a hospital admission)  Social History   Socioeconomic History   Marital status: Married    Spouse name: Not on file   Number of children: Not on file   Years of education: Not on file   Highest education level: Not on file  Occupational History   Not on file  Tobacco Use   Smoking status: Former   Smokeless tobacco: Never  Substance and Sexual Activity   Alcohol use: No   Drug use: No   Sexual activity: Not on file  Other Topics Concern   Not on file  Social History Narrative   Not on file   Social Determinants of Health   Financial Resource Strain: Not on file  Food Insecurity: Not on file  Transportation Needs: Not on file  Physical Activity: Not on file  Stress: Not on file  Social Connections: Not on file  Intimate Partner Violence: Not on file    History reviewed. No pertinent family history.    Review of systems complete and found to be negative unless listed above      PHYSICAL EXAM  General: Well developed, well nourished, in no acute distress HEENT:  Normocephalic and atramatic Neck:  No JVD.  Lungs: Clear bilaterally to auscultation and percussion. Heart: HRRR . Normal S1 and S2 without gallops or murmurs.  Abdomen: Bowel sounds are positive, abdomen soft and non-tender  Msk:  Back normal, normal gait. Normal strength and tone for age. Extremities: No clubbing, cyanosis or edema.    Neuro: Alert and oriented X 3. Psych:  Good affect, responds appropriately  Labs:   Lab Results  Component Value Date   WBC 6.9 08/22/2021   HGB 15.0 08/22/2021   HCT 46.0 08/22/2021   MCV 93.1 08/22/2021   PLT 351 08/22/2021    Recent Labs  Lab 08/22/21 0510  NA 136  K 3.8  CL 105  CO2 26  BUN 28*  CREATININE 1.52*  CALCIUM 8.8*  GLUCOSE 110*   Lab Results  Component Value Date   TROPONINI 0.03 12/09/2015   No results found for: CHOL No results found for: HDL No results found for: LDLCALC No results found for: TRIG No results found for: CHOLHDL No results found for: LDLDIRECT    Radiology: DG Chest 2 View  Result Date: 08/22/2021 CLINICAL DATA:  70 year old male with shortness of breath since 20/1 100 hours, worse when lying flat. EXAM: CHEST - 2 VIEW COMPARISON:  Chest radiograph 03/03/2020 and earlier. FINDINGS: Stable left chest AICD. Stable cardiac size at the upper limits of normal. Other mediastinal contours are within normal limits. Visualized tracheal air column is within normal limits. Confluent new veiling opacity at the left lung base most resembles a small to moderate pleural effusion on the lateral view. No definite air bronchograms. Increased pulmonary interstitial opacity, favor vascular congestion. No pneumothorax.  Right lung otherwise clear. No acute osseous abnormality identified. Negative visible bowel gas. IMPRESSION: New small to moderate left pleural effusion and increased pulmonary vascular congestion compatible with mild interstitial edema. Electronically Signed   By: Genevie Ann M.D.   On: 08/22/2021 05:47    EKG: ***  ASSESSMENT AND PLAN:  ***  Signed: Yolonda Kida MD 08/22/2021, 12:44 PM

## 2021-08-23 ENCOUNTER — Inpatient Hospital Stay
Admit: 2021-08-23 | Discharge: 2021-08-23 | Disposition: A | Discharge status: Home / Self Care | Payer: Medicare HMO | Attending: Family Medicine | Admitting: Family Medicine

## 2021-08-23 LAB — CBC
HCT: 44 % (ref 39.0–52.0)
Hemoglobin: 14.5 g/dL (ref 13.0–17.0)
MCH: 30.5 pg (ref 26.0–34.0)
MCHC: 33 g/dL (ref 30.0–36.0)
MCV: 92.6 fL (ref 80.0–100.0)
Platelets: 347 10*3/uL (ref 150–400)
RBC: 4.75 MIL/uL (ref 4.22–5.81)
RDW: 16.1 % — ABNORMAL HIGH (ref 11.5–15.5)
WBC: 6.9 10*3/uL (ref 4.0–10.5)
nRBC: 0 % (ref 0.0–0.2)

## 2021-08-23 LAB — MAGNESIUM: Magnesium: 2.1 mg/dL (ref 1.7–2.4)

## 2021-08-23 LAB — TSH: TSH: 3.653 u[IU]/mL (ref 0.350–4.500)

## 2021-08-23 LAB — BASIC METABOLIC PANEL
Anion gap: 8 (ref 5–15)
BUN: 27 mg/dL — ABNORMAL HIGH (ref 8–23)
CO2: 25 mmol/L (ref 22–32)
Calcium: 8.9 mg/dL (ref 8.9–10.3)
Chloride: 106 mmol/L (ref 98–111)
Creatinine, Ser: 1.37 mg/dL — ABNORMAL HIGH (ref 0.61–1.24)
GFR, Estimated: 56 mL/min — ABNORMAL LOW (ref >60)
Glucose, Bld: 96 mg/dL (ref 70–99)
Potassium: 4.7 mmol/L (ref 3.5–5.1)
Sodium: 139 mmol/L (ref 135–145)

## 2021-08-23 MED ORDER — APIXABAN 5 MG PO TABS
5.0000 mg | ORAL_TABLET | Freq: Two times a day (BID) | ORAL | Status: DC
  Administered 2021-08-23 – 2021-08-25 (×4): 5 mg via ORAL
  Filled 2021-08-23 (×4): qty 1

## 2021-08-23 MED ORDER — PERFLUTREN LIPID MICROSPHERE
1.0000 mL | INTRAVENOUS | Status: AC | PRN
  Administered 2021-08-23: 3 mL via INTRAVENOUS
  Filled 2021-08-23: qty 10

## 2021-08-23 MED ORDER — METOPROLOL TARTRATE 25 MG PO TABS
25.0000 mg | ORAL_TABLET | Freq: Three times a day (TID) | ORAL | Status: DC
  Filled 2021-08-23: qty 1

## 2021-08-23 NOTE — Progress Notes (Signed)
[facility] Cardiology    SUBJECTIVE: Patient feels better less shortness of breath denies any palpitations tachycardia leg swelling is improved   Vitals:   08/22/21 1930 08/23/21 0000 08/23/21 0400 08/23/21 0600  BP: 96/73 98/71 96/64  101/86  Pulse: 79 69 65 73  Resp: 19 18 18 16   Temp:   98 F (36.7 C)   TempSrc:   Oral   SpO2: 95% 91% 96% 93%  Weight:      Height:         Intake/Output Summary (Last 24 hours) at 08/23/2021 0741 Last data filed at 08/23/2021 0450 Gross per 24 hour  Intake 550 ml  Output 2300 ml  Net -1750 ml      PHYSICAL EXAM  General: Well developed, well nourished, in no acute distress HEENT:  Normocephalic and atramatic Neck:  No JVD.  Lungs: rales bilaterally to auscultation and percussion. Heart: Irregular irregular. Normal S1 and S2 without gallops or murmurs.  Abdomen: Bowel sounds are positive, abdomen soft and non-tender  Msk:  Back normal, normal gait. Normal strength and tone for age. Extremities: No clubbing, cyanosis or 2+edema.   Neuro: Alert and oriented X 3. Psych:  Good affect, responds appropriately   LABS: Basic Metabolic Panel: Recent Labs    08/22/21 0510 08/22/21 1209 08/23/21 0455  NA 136  --  139  K 3.8  --  4.7  CL 105  --  106  CO2 26  --  25  GLUCOSE 110*  --  96  BUN 28*  --  27*  CREATININE 1.52*  --  1.37*  CALCIUM 8.8*  --  8.9  MG  --  2.1 2.1   Liver Function Tests: No results for input(s): AST, ALT, ALKPHOS, BILITOT, PROT, ALBUMIN in the last 72 hours. No results for input(s): LIPASE, AMYLASE in the last 72 hours. CBC: Recent Labs    08/22/21 0510 08/23/21 0455  WBC 6.9 6.9  HGB 15.0 14.5  HCT 46.0 44.0  MCV 93.1 92.6  PLT 351 347   Cardiac Enzymes: No results for input(s): CKTOTAL, CKMB, CKMBINDEX, TROPONINI in the last 72 hours. BNP: Invalid input(s): POCBNP D-Dimer: No results for input(s): DDIMER in the last 72 hours. Hemoglobin A1C: No results for input(s): HGBA1C in the last 72  hours. Fasting Lipid Panel: No results for input(s): CHOL, HDL, LDLCALC, TRIG, CHOLHDL, LDLDIRECT in the last 72 hours. Thyroid Function Tests: Recent Labs    08/23/21 0455  TSH 3.653   Anemia Panel: No results for input(s): VITAMINB12, FOLATE, FERRITIN, TIBC, IRON, RETICCTPCT in the last 72 hours.  DG Chest 2 View  Result Date: 08/22/2021 CLINICAL DATA:  70 year old male with shortness of breath since 20/1 100 hours, worse when lying flat. EXAM: CHEST - 2 VIEW COMPARISON:  Chest radiograph 03/03/2020 and earlier. FINDINGS: Stable left chest AICD. Stable cardiac size at the upper limits of normal. Other mediastinal contours are within normal limits. Visualized tracheal air column is within normal limits. Confluent new veiling opacity at the left lung base most resembles a small to moderate pleural effusion on the lateral view. No definite air bronchograms. Increased pulmonary interstitial opacity, favor vascular congestion. No pneumothorax. Right lung otherwise clear. No acute osseous abnormality identified. Negative visible bowel gas. IMPRESSION: New small to moderate left pleural effusion and increased pulmonary vascular congestion compatible with mild interstitial edema. Electronically Signed   By: Genevie Ann M.D.   On: 08/22/2021 05:47     Echo pending  TELEMETRY: Atrial fibrillation rate  still of 100 relatively asymptomatic nonspecific changes:  ASSESSMENT AND PLAN:  Principal Problem:   Acute on chronic systolic (congestive) heart failure (HCC) Active Problems:   Acute on chronic systolic CHF (congestive heart failure), NYHA class 3 (HCC)   CAD (coronary artery disease)   Cardiomyopathy (HCC)   A-fib (HCC)   Elevated troponin    Plan Agree with telemetry for atrial fibrillation Recommend converting from Cardizem to p.o. metoprolol for rate control as well as heart failure Discontinue Lovenox institute Eliquis 5 twice a day Continue IV diuretic therapy for heart  failure Recommend amiodarone therapy to help with rhythm control Acute on chronic renal insufficiency continue to follow renal function consider nephrology input Soft blood pressure hopefully will transition from Cardizem will have improvement in his blood pressure Shortness of breath much improved with diuretic therapy Has been patient follow-up with heart failure clinic and with primary cardiologist for atrial fibrillation suppression   Yolonda Kida, MD 08/23/2021 7:41 AM

## 2021-08-23 NOTE — Progress Notes (Signed)
ANTICOAGULATION CONSULT NOTE  Pharmacy Consult for apixaban Indication: atrial fibrillation  Patient Measurements: Height: 5\' 9"  (175.3 cm) Weight: 79.4 kg (175 lb) IBW/kg (Calculated) : 70.7  Vital Signs: Temp: 97.5 F (36.4 C) (12/03 0816) Temp Source: Oral (12/03 0400) BP: 91/79 (12/03 0816) Pulse Rate: 79 (12/03 0816)  Labs: Recent Labs    08/22/21 0510 08/22/21 1209 08/23/21 0455  HGB 15.0  --  14.5  HCT 46.0  --  44.0  PLT 351  --  347  CREATININE 1.52*  --  1.37*  TROPONINIHS 22* 24*  --     Estimated Creatinine Clearance: 50.9 mL/min (A) (by C-G formula based on SCr of 1.37 mg/dL (H)).   Medical History: Past Medical History:  Diagnosis Date   CHF (congestive heart failure) (HCC)    Hypertension    Myocardial infarct (HCC)     Medications:  Scheduled:   furosemide  40 mg Intravenous BID   metoprolol tartrate  25 mg Oral TID   montelukast  10 mg Oral QHS   potassium chloride  20 mEq Oral BID   sacubitril-valsartan  1 tablet Oral BID   sodium chloride flush  3 mL Intravenous Q12H   spironolactone  25 mg Oral Daily   tamsulosin  0.4 mg Oral QPC breakfast    Assessment: 70 y.o. male with a past medical history of CHF (EF 15-20% on last echo in 2021) s/p defibrilator placement 2014, CAD s/p PIC and stent placement RCA 2013, and gout who presents for assessment of worsening shortness of breath and orthopnea and atrial fibrillation. He was not on chronic anticoagulation PTA according to records.    Goal of Therapy:  Monitor platelets by anticoagulation protocol: Yes   Plan:  He received a therapeutic dose of enoxaparin this morning at 0501 There are no qualifications for dose reduction of apixaban Start apixaban 5 mg po twice daily beginning 12 hours after previous therapeutic enoxaparin dose  Dallie Piles 08/23/2021,9:32 AM

## 2021-08-23 NOTE — Progress Notes (Signed)
PROGRESS NOTE  Isaac [name]  DOB: February 29, 1951  PCP: [name], MD KYH:[id number]  DOA: 08/22/2021  LOS: 1 day  Hospital Day: 2  Chief Complaint  Patient presents with   Shortness of Breath    Brief narrative: Isaac [name] is a 70 y.o. male with PMH significant for HTN, HLD, CAD with stents, chronic systolic CHF s/p AICD, BPH Patient presented to the ED on 12/2 with complaint of progressively worsening shortness of breath, pedal edema, exercise tolerance for 2 weeks. Last seen by cardiologist on July 09, 2021.  Early November, patient had acute gout and was treated with steroids.  Seen by PCP on 11/30 for worsening shortness of breath and pedal edema.  His symptoms continued to worsen despite compliance to his medications and a strict dietary adherence  In the ED, patient was afebrile, tachycardic to 161, blood pressure normal, O2 sat 99% on room air. Labs with CBC unremarkable, creatinine elevated 1.52, BNP elevated to 1600.  Troponin elevated to 22 and 24. Chest x-ray showed new left-sided pleural effusion and findings consistent with pulmonary edema.  ECG showed new Afib with RVR but no new ischemic changes compared to prior. Patient was started on IV Cardizem drip, IV Lasix and admitted to hospitalist service.    Subjective: Patient was seen and examined this morning.  Pleasant elderly African-American male.  Walking back from bathroom bed.  Not on supplemental oxygen.  Still has bilateral pedal edema. Chart reviewed.  Tachycardia improved overnight.  Blood pressure in 90s and 100s Labs this morning with creatinine better at 1.37  Assessment/Plan: Acute on chronic systolic heart failure -Presented with progressively worsening dyspnea, lower extremity edema, exercise intolerance in the setting of severe systolic CHF -BNP elevated.  Chest x-ray with pulm edema. -Unclear precipitating factor.  Unsure if A. fib was the cause or consequence of heart failure  exacerbation. -Home meds include Lasix, Entresto, Aldactone.  -Started on Lasix IV twice daily. -Blood pressure is low this morning, can hold IV Lasix.  Metoprolol was added by cardiologist.  Azucena Fallen and Aldactone. -Net IO Since Admission: -1,750 mL [08/23/21 1216] -Continue to monitor for daily intake output, weight, blood pressure, BNP, renal function and electrolytes. Recent Labs  Lab 08/22/21 0510 08/22/21 1209 08/23/21 0455  BNP 1,593.4*  --   --   BUN 28*  --  27*  CREATININE 1.52*  --  1.37*  K 3.8  --  4.7  MG  --  2.1 2.1   Atrial fibrillation with RVR -It seems patient did not have pre-existing diagnosis of A. fib. -Presented with A. fib with RVR with heart rate up to 160s in the setting of CHF exacerbation -Started on Cardizem drip.  Cardiology consulted. -CHADSVASC score of 4.  Started on Eliquis.  Elevated troponin CAD status post stents -Mild troponin elevation probably due to LV strain -Has history of stent placement twice, 2007 and 2013. -Home meds include aspirin 81 mg daily.  He states he was previously on Plavix for a year after his stent.  He is not sure why is not on a statin.  Obtain lipid panel.   Recent Labs    08/22/21 0510 08/22/21 1209  TROPONINIHS 22* 24*   Ischemic cardiomyopathy -Status post AICD  BPH -Flomax  Asthma/allergies -continue albuterol as needed, daily fluticasone, montelukast  Mobility: Encourage ambulation Living condition: Lives at home Goals of care:   Code Status: DNR  Nutritional status: Body mass index is 25.84 kg/m.  Diet:  Diet Order             Diet Heart Room service appropriate? Yes; Fluid consistency: Thin  Diet effective now                  DVT prophylaxis:   apixaban (ELIQUIS) tablet 5 mg   Antimicrobials: None Fluid: None Consultants: Cardiology Family Communication: None at bedside  Status is: Inpatient  Remains inpatient appropriate because: CHF regimen being  titrated Dispo: The patient is from: Home              Anticipated d/c is to: Home in 1 to 2 days              Patient currently is not medically stable to d/c.   Difficult to place patient No     Infusions:     Scheduled Meds:  apixaban  5 mg Oral BID   furosemide  40 mg Intravenous BID   metoprolol tartrate  25 mg Oral TID   montelukast  10 mg Oral QHS   potassium chloride  20 mEq Oral BID   sacubitril-valsartan  1 tablet Oral BID   sodium chloride flush  3 mL Intravenous Q12H   spironolactone  25 mg Oral Daily   tamsulosin  0.4 mg Oral QPC breakfast    PRN meds: acetaminophen **OR** acetaminophen, albuterol, fluticasone, ondansetron **OR** ondansetron (ZOFRAN) IV, oxyCODONE, polyethylene glycol, traZODone   Antimicrobials: Anti-infectives (From admission, onward)    None       Objective: Vitals:   08/23/21 0816 08/23/21 1132  BP: 91/79 (!) 86/68  Pulse: 79 85  Resp: 18 18  Temp: (!) 97.5 F (36.4 C) 97.9 F (36.6 C)  SpO2: 100% 95%    Intake/Output Summary (Last 24 hours) at 08/23/2021 1216 Last data filed at 08/23/2021 0450 Gross per 24 hour  Intake 550 ml  Output 2300 ml  Net -1750 ml   Filed Weights   08/22/21 0506  Weight: 79.4 kg   Weight change:  Body mass index is 25.84 kg/m.   Physical Exam: General exam: Pleasant, elderly African-American male.  Not in distress Skin: No rashes, lesions or ulcers. HEENT: Atraumatic, normocephalic, no obvious bleeding Lungs: Clear to auscultation bilaterally CVS: Rate controlled A. fib, no murmur GI/Abd soft, nontender, nondistended, bowel sound present CNS: Alert, awake, oriented x3 Psychiatry: Mood appropriate Extremities: Pedal edema trace to 1+ bilaterally, no calf tenderness  Data Review: I have personally reviewed the laboratory data and studies available.  F/u labs ordered Unresulted Labs (From admission, onward)     Start     Ordered   08/24/21 0500  Lipid panel  Tomorrow morning,   R         08/23/21 0828   08/24/21 0500  CBC with Differential/Platelet  Daily,   R      08/23/21 1216   08/24/21 5456  Basic metabolic panel  Daily,   R      08/23/21 1216   08/23/21 0500  Hemoglobin A1c  Tomorrow morning,   STAT        08/22/21 1417            Signed, Terrilee Croak, MD [facility] 08/23/2021

## 2021-08-24 LAB — ECHOCARDIOGRAM COMPLETE
AR max vel: 1.96 cm2
AV Peak grad: 3.5 mmHg
Ao pk vel: 0.93 m/s
Area-P 1/2: 7.16 cm2
Calc EF: 21.6 %
Height: 69 in
MV VTI: 1.47 cm2
S' Lateral: 5.85 cm
Single Plane A2C EF: 36.3 %
Single Plane A4C EF: 8.7 %
Weight: 2800 oz

## 2021-08-24 LAB — CBC WITH DIFFERENTIAL/PLATELET
Abs Immature Granulocytes: 0.01 10*3/uL (ref 0.00–0.07)
Basophils Absolute: 0.1 10*3/uL (ref 0.0–0.1)
Basophils Relative: 1 %
Eosinophils Absolute: 3.5 10*3/uL — ABNORMAL HIGH (ref 0.0–0.5)
Eosinophils Relative: 47 %
HCT: 41.3 % (ref 39.0–52.0)
Hemoglobin: 13.7 g/dL (ref 13.0–17.0)
Immature Granulocytes: 0 %
Lymphocytes Relative: 12 %
Lymphs Abs: 0.9 10*3/uL (ref 0.7–4.0)
MCH: 30.6 pg (ref 26.0–34.0)
MCHC: 33.2 g/dL (ref 30.0–36.0)
MCV: 92.2 fL (ref 80.0–100.0)
Monocytes Absolute: 0.7 10*3/uL (ref 0.1–1.0)
Monocytes Relative: 9 %
Neutro Abs: 2.3 10*3/uL (ref 1.7–7.7)
Neutrophils Relative %: 31 %
Platelets: 328 10*3/uL (ref 150–400)
RBC: 4.48 MIL/uL (ref 4.22–5.81)
RDW: 16 % — ABNORMAL HIGH (ref 11.5–15.5)
Smear Review: NORMAL
WBC: 7.5 10*3/uL (ref 4.0–10.5)
nRBC: 0 % (ref 0.0–0.2)

## 2021-08-24 LAB — LACTIC ACID, PLASMA
Lactic Acid, Venous: 1.5 mmol/L (ref 0.5–1.9)
Lactic Acid, Venous: 1.6 mmol/L (ref 0.5–1.9)

## 2021-08-24 LAB — BASIC METABOLIC PANEL
Anion gap: 8 (ref 5–15)
BUN: 28 mg/dL — ABNORMAL HIGH (ref 8–23)
CO2: 24 mmol/L (ref 22–32)
Calcium: 8.9 mg/dL (ref 8.9–10.3)
Chloride: 103 mmol/L (ref 98–111)
Creatinine, Ser: 1.65 mg/dL — ABNORMAL HIGH (ref 0.61–1.24)
GFR, Estimated: 45 mL/min — ABNORMAL LOW (ref >60)
Glucose, Bld: 93 mg/dL (ref 70–99)
Potassium: 4.7 mmol/L (ref 3.5–5.1)
Sodium: 135 mmol/L (ref 135–145)

## 2021-08-24 LAB — LIPID PANEL
Cholesterol: 101 mg/dL (ref 0–200)
HDL: 28 mg/dL — ABNORMAL LOW (ref >40)
LDL Cholesterol: 64 mg/dL (ref 0–99)
Total CHOL/HDL Ratio: 3.6 RATIO
Triglycerides: 44 mg/dL (ref <150)
VLDL: 9 mg/dL (ref 0–40)

## 2021-08-24 MED ORDER — MIDODRINE HCL 5 MG PO TABS
2.5000 mg | ORAL_TABLET | Freq: Three times a day (TID) | ORAL | Status: DC
  Administered 2021-08-24 – 2021-08-25 (×4): 2.5 mg via ORAL
  Filled 2021-08-24 (×4): qty 1

## 2021-08-24 MED ORDER — CARVEDILOL PHOSPHATE ER 10 MG PO CP24
40.0000 mg | ORAL_CAPSULE | Freq: Every day | ORAL | Status: DC
  Filled 2021-08-24: qty 4

## 2021-08-24 MED ORDER — AMIODARONE HCL 200 MG PO TABS
200.0000 mg | ORAL_TABLET | Freq: Two times a day (BID) | ORAL | Status: DC
  Administered 2021-08-24 – 2021-08-25 (×3): 200 mg via ORAL
  Filled 2021-08-24 (×3): qty 1

## 2021-08-24 MED ORDER — SPIRONOLACTONE 25 MG PO TABS
12.5000 mg | ORAL_TABLET | Freq: Every day | ORAL | Status: DC
  Administered 2021-08-24: 13:00:00 12.5 mg via ORAL
  Filled 2021-08-24 (×2): qty 0.5
  Filled 2021-08-24: qty 1

## 2021-08-24 MED ORDER — AMIODARONE HCL 200 MG PO TABS: 200.0000 mg | ORAL_TABLET | Freq: Two times a day (BID) | ORAL | 2 refills | Status: DC

## 2021-08-24 MED ORDER — APIXABAN 5 MG PO TABS: 5.0000 mg | ORAL_TABLET | Freq: Two times a day (BID) | ORAL | 2 refills | Status: DC

## 2021-08-24 MED ORDER — SODIUM CHLORIDE 0.9 % IV BOLUS
500.0000 mL | Freq: Once | INTRAVENOUS | Status: AC
Start: 2021-08-24 — End: 2021-08-24
  Administered 2021-08-24: 16:00:00 500 mL via INTRAVENOUS

## 2021-08-24 MED ORDER — MIDODRINE HCL 2.5 MG PO TABS: 2.5000 mg | ORAL_TABLET | Freq: Three times a day (TID) | ORAL | 2 refills | Status: DC

## 2021-08-24 MED ORDER — METOPROLOL TARTRATE 25 MG PO TABS: 12.5000 mg | ORAL_TABLET | Freq: Two times a day (BID) | ORAL | 2 refills | Status: DC

## 2021-08-24 MED ORDER — METOPROLOL TARTRATE 25 MG PO TABS
12.5000 mg | ORAL_TABLET | Freq: Two times a day (BID) | ORAL | Status: DC
  Administered 2021-08-24: 13:00:00 12.5 mg via ORAL
  Filled 2021-08-24 (×2): qty 1

## 2021-08-24 MED ORDER — SODIUM CHLORIDE 0.9 % IV BOLUS
250.0000 mL | Freq: Once | INTRAVENOUS | Status: AC
  Administered 2021-08-24: 21:00:00 250 mL via INTRAVENOUS

## 2021-08-24 MED ORDER — FUROSEMIDE 20 MG PO TABS: 20.0000 mg | ORAL_TABLET | Freq: Every day | ORAL | Status: DC | PRN

## 2021-08-24 MED ORDER — FUROSEMIDE 20 MG PO TABS: 20.0000 mg | ORAL_TABLET | Freq: Every day | ORAL | 0 refills | Status: DC | PRN

## 2021-08-24 MED ORDER — SPIRONOLACTONE 25 MG PO TABS: 12.5000 mg | ORAL_TABLET | Freq: Every day | ORAL | 2 refills | Status: DC

## 2021-08-24 NOTE — Progress Notes (Addendum)
Pt reported feeling tired and flushed after walking to BR, BP 78/57 MAP 65. MD made aware/orders for 573ml bolus over one hour and midodrine 2.5 mg BID.

## 2021-08-24 NOTE — Progress Notes (Signed)
[facility] Cardiology    SUBJECTIVE: Patient states he felt somewhat better but still feels weak blood pressure is doing really well he was interested in going home but with continued hypotension weakness lightheadedness we have elected to postpone discharge until may be tomorrow   Vitals:   08/23/21 2340 08/24/21 0425 08/24/21 0431 08/24/21 0728  BP: 96/68  (!) 99/44 (!) 85/64  Pulse: 83  95 99  Resp: 17  18 18   Temp: 98.4 F (36.9 C)  (!) 97.5 F (36.4 C) 98.1 F (36.7 C)  TempSrc:    Oral  SpO2: 98%  100% 93%  Weight:  79.1 kg    Height:         Intake/Output Summary (Last 24 hours) at 08/24/2021 0900 Last data filed at 08/23/2021 1830 Gross per 24 hour  Intake 480 ml  Output --  Net 480 ml      PHYSICAL EXAM  General: Well developed, well nourished, in no acute distress HEENT:  Normocephalic and atramatic Neck:  No JVD.  Lungs: Clear bilaterally to auscultation and percussion. Heart: HRRR . Normal S1 and S2 without gallops or murmurs.  Abdomen: Bowel sounds are positive, abdomen soft and non-tender  Msk:  Back normal, normal gait. Normal strength and tone for age. Extremities: No clubbing, cyanosis or 3+edema.   Neuro: Alert and oriented X 3. Psych:  Good affect, responds appropriately   LABS: Basic Metabolic Panel: Recent Labs    08/22/21 1209 08/23/21 0455 08/24/21 0605  NA  --  139 135  K  --  4.7 4.7  CL  --  106 103  CO2  --  25 24  GLUCOSE  --  96 93  BUN  --  27* 28*  CREATININE  --  1.37* 1.65*  CALCIUM  --  8.9 8.9  MG 2.1 2.1  --    Liver Function Tests: No results for input(s): AST, ALT, ALKPHOS, BILITOT, PROT, ALBUMIN in the last 72 hours. No results for input(s): LIPASE, AMYLASE in the last 72 hours. CBC: Recent Labs    08/23/21 0455 08/24/21 0605  WBC 6.9 7.5  NEUTROABS  --  2.3  HGB 14.5 13.7  HCT 44.0 41.3  MCV 92.6 92.2  PLT 347 328   Cardiac Enzymes: No results for input(s): CKTOTAL, CKMB, CKMBINDEX, TROPONINI in the last 72  hours. BNP: Invalid input(s): POCBNP D-Dimer: No results for input(s): DDIMER in the last 72 hours. Hemoglobin A1C: No results for input(s): HGBA1C in the last 72 hours. Fasting Lipid Panel: Recent Labs    08/24/21 0605  CHOL 101  HDL 28*  LDLCALC 64  TRIG 44  CHOLHDL 3.6   Thyroid Function Tests: Recent Labs    08/23/21 0455  TSH 3.653   Anemia Panel: No results for input(s): VITAMINB12, FOLATE, FERRITIN, TIBC, IRON, RETICCTPCT in the last 72 hours.  No results found.   Echo severely depressed left ventricular function less than 20%  TELEMETRY: Fibrillation rate of around 100-1 10:  ASSESSMENT AND PLAN:  Principal Problem:   Acute on chronic systolic (congestive) heart failure (HCC) Active Problems:   Acute on chronic systolic CHF (congestive heart failure), NYHA class 3 (HCC)   CAD (coronary artery disease)   Cardiomyopathy (HCC)   A-fib (HCC)   Elevated troponin Hypotension  Plan Recommend continue telemetry for now Agree with postponing discharge until may be tomorrow Hypotension is probably related to medications as well as systolic dysfunction will reduce blood pressure medications Recommend adding midodrine also  to help with blood pressure support Reduce diuretic therapy because of possible mild intravascular dehydration Continue antiarrhythmic with amiodarone metoprolol for rate Eliquis for anticoagulation Atrial fibrillation reasonable rate control continue current conservative management Known coronary artery disease no evidence of active problems SOB stable Reevaluate potential discharge tomorrow if blood pressure is improved    Yolonda Kida, MD 08/24/2021 9:00 AM

## 2021-08-24 NOTE — Progress Notes (Signed)
Pt refusing to take meds at this time. Pt and spouse want to see MD before pt takes any meds. MD made aware.

## 2021-08-24 NOTE — Discharge Summary (Signed)
Physician Discharge Summary  Isaac [name] XKG:[id number] DOB: 25-Sep-1950 DOA: 08/22/2021  PCP: [name], MD  Admit date: 08/22/2021 Discharge date: 08/24/2021  Admitted From: Home Discharge disposition: Home   Code Status: DNR   Discharge Diagnosis:   Principal Problem:   Acute on chronic systolic (congestive) heart failure (HCC) Active Problems:   Acute on chronic systolic CHF (congestive heart failure), NYHA class 3 (HCC)   CAD (coronary artery disease)   Cardiomyopathy ([place])   A-fib ([place])   Elevated troponin    Chief Complaint  Patient presents with   Shortness of Breath    Brief narrative: Isaac [name] is a 70 y.o. male with PMH significant for HTN, HLD, CAD with stents, chronic systolic CHF s/p AICD, BPH Patient presented to the ED on 12/2 with complaint of progressively worsening shortness of breath, pedal edema, exercise tolerance for 2 weeks. Last seen by cardiologist on July 09, 2021.  Early November, patient had acute gout and was treated with steroids.  Seen by PCP on 11/30 for worsening shortness of breath and pedal edema.  His symptoms continued to worsen despite compliance to his medications and a strict dietary adherence  In the ED, patient was afebrile, tachycardic to 161, blood pressure normal, O2 sat 99% on room air. Labs with CBC unremarkable, creatinine elevated 1.52, BNP elevated to 1600.  Troponin elevated to 22 and 24. Chest x-ray showed new left-sided pleural effusion and findings consistent with pulmonary edema.  ECG showed new Afib with RVR but no new ischemic changes compared to prior. Patient was started on IV Cardizem drip, IV Lasix and admitted to hospitalist service.    Subjective: Patient was seen and examined this morning.   Lying on bed.  Not in distress.  Frustrated with his medical condition and changes in medication but understands he needs to comply with his medications.   Family at bedside.   Later this morning, I  met and finalized his medications for discharge with cardiologist Dr. Clayborn Bigness.  Assessment/Plan: Acute on chronic systolic heart failure -Presented with progressively worsening dyspnea, lower extremity edema, exercise intolerance in the setting of severe systolic CHF -BNP elevated.  Chest x-ray with pulm edema. -Unclear precipitating factor.  Unsure if A. fib was the cause or consequence of heart failure exacerbation. -Home meds include Lasix, Entresto, Aldactone.  -In the hospital, he was given IV Lasix with good diuresis but is blood pressure started to drop.  IV Lasix was held subsequently.  He also needed to be started on metoprolol because of coexisting A. fib. -We will discharge him on metoprolol 12.5 mg twice daily, continue Entresto and Aldactone but Lasix to remain as as needed only. For blood pressure support, patient has also been started on midodrine.  Atrial fibrillation with RVR -It seems patient did not have pre-existing diagnosis of A. fib. -Presented with A. fib with RVR with heart rate up to 160s in the setting of CHF exacerbation -He was initially started on Cardizem drip.  Cardiology recommended metoprolol and amiodarone.  Discharge on metoprolol 0.5 mg daily and amiodarone 200 mg daily. -CHADSVASC score of 4.  Started on Eliquis.  AKI on CKD 3a -Creatinine worsened because of Lasix. -Continue to follow renal function as an outpatient. Recent Labs    08/22/21 0510 08/23/21 0455 08/24/21 0605  BUN 28* 27* 28*  CREATININE 1.52* 1.37* 1.65*   Elevated troponin CAD status post stents -Mild troponin elevation probably due to LV strain -Has history of stent placement twice,  2007 and 2013. -Home meds include aspirin 81 mg daily.  He states he was previously on Plavix for a year after his stent.  He is not sure why is not on a statin.  LDL 64, HDL 28 today. -Since he has been started on Eliquis, I stopped aspirin at discharge.    Component Value Date/Time   CHOL 101  08/24/2021 0605   TRIG 44 08/24/2021 0605   HDL 28 (L) 08/24/2021 0605   CHOLHDL 3.6 08/24/2021 0605   VLDL 9 08/24/2021 0605   LDLCALC 64 08/24/2021 0605   Ischemic cardiomyopathy -Status post AICD  BPH -Flomax  Asthma/allergies -continue albuterol as needed, daily fluticasone, montelukast  Discharge Medications:   Allergies as of 08/24/2021       Reactions   Mushroom Extract Complex Nausea And Vomiting   Neosporin [neomycin-bacitracin Zn-polymyx] Hives        Medication List     STOP taking these medications    aspirin 81 MG EC tablet   aspirin EC 325 MG tablet   carvedilol 80 MG 24 hr capsule Commonly known as: COREG CR   cetirizine 10 MG tablet Commonly known as: ZYRTEC   clopidogrel 75 MG tablet Commonly known as: PLAVIX   HYDROcodone-acetaminophen 5-325 MG tablet Commonly known as: NORCO/VICODIN   olmesartan 40 MG tablet Commonly known as: BENICAR   sildenafil 100 MG tablet Commonly known as: VIAGRA       TAKE these medications    albuterol 108 (90 Base) MCG/ACT inhaler Commonly known as: VENTOLIN HFA Inhale 2 puffs into the lungs every 6 (six) hours as needed.   amiodarone 200 MG tablet Commonly known as: PACERONE Take 1 tablet (200 mg total) by mouth 2 (two) times daily.   apixaban 5 MG Tabs tablet Commonly known as: ELIQUIS Take 1 tablet (5 mg total) by mouth 2 (two) times daily.   Entresto 24-26 MG Generic drug: sacubitril-valsartan SMARTSIG:1 Tablet(s) By Mouth Every 12 Hours   fluticasone 50 MCG/ACT nasal spray Commonly known as: FLONASE Place 2 sprays into both nostrils daily.   furosemide 20 MG tablet Commonly known as: LASIX Take 1 tablet (20 mg total) by mouth daily as needed for edema or fluid (sob). What changed:  medication strength how much to take when to take this reasons to take this   metoprolol tartrate 25 MG tablet Commonly known as: LOPRESSOR Take 0.5 tablets (12.5 mg total) by mouth 2 (two) times  daily.   midodrine 2.5 MG tablet Commonly known as: PROAMATINE Take 1 tablet (2.5 mg total) by mouth 3 (three) times daily with meals.   montelukast 10 MG tablet Commonly known as: SINGULAIR Take by mouth.   spironolactone 25 MG tablet Commonly known as: ALDACTONE Take 0.5 tablets (12.5 mg total) by mouth daily. What changed: how much to take   tamsulosin 0.4 MG Caps capsule Commonly known as: FLOMAX TAKE 1 CAPSULE (0.4 MG TOTAL) BY MOUTH DAILY AFTER BREAKFAST.        Wound care:     Discharge Instructions:   Discharge Instructions     (HEART FAILURE PATIENTS) Call MD:  Anytime you have any of the following symptoms: 1) 3 pound weight gain in 24 hours or 5 pounds in 1 week 2) shortness of breath, with or without a dry hacking cough 3) swelling in the hands, feet or stomach 4) if you have to sleep on extra pillows at night in order to breathe.   Complete by: As directed    Amb  referral to AFIB Clinic   Complete by: As directed    Call MD for:  difficulty breathing, headache or visual disturbances   Complete by: As directed    Call MD for:  extreme fatigue   Complete by: As directed    Call MD for:  hives   Complete by: As directed    Call MD for:  persistant dizziness or light-headedness   Complete by: As directed    Call MD for:  persistant nausea and vomiting   Complete by: As directed    Call MD for:  severe uncontrolled pain   Complete by: As directed    Call MD for:  temperature >100.4   Complete by: As directed    Diet - low sodium heart healthy   Complete by: As directed    Discharge instructions   Complete by: As directed    Discharge instructions for CHF Check weight daily -preferably same time every day. Restrict fluid intake to 1200 ml daily Restrict salt intake to less than 2 g daily. Call MD if you have one of the following symptoms 1) 3 pound weight gain in 24 hours or 5 pounds in 1 week  2) swelling in the hands, feet or stomach  3) progressive  shortness of breath 4) if you have to sleep on extra pillows at night in order to breathe     General discharge instructions:  Follow with Primary MD [name], MD in 7 days   Get CBC/BMP checked in next visit within 1 week by PCP or SNF MD. (We routinely change or add medications that can affect your baseline labs and fluid status, therefore we recommend that you get the mentioned basic workup next visit with your PCP, your PCP may decide not to get them or add new tests based on their clinical decision)  On your next visit with your PCP, please get your medicines reviewed and adjusted.  Please request your PCP  to go over all hospital tests, procedures, radiology results at the follow up, please get all Hospital records sent to your PCP by signing hospital release before you go home.  Activity: As tolerated with Full fall precautions use walker/cane & assistance as needed  Avoid using any recreational substances like cigarette, tobacco, alcohol, or non-prescribed drug.  If you experience worsening of your admission symptoms, develop shortness of breath, life threatening emergency, suicidal or homicidal thoughts you must seek medical attention immediately by calling 911 or calling your MD immediately  if symptoms less severe.  You must read complete instructions/literature along with all the possible adverse reactions/side effects for all the medicines you take and that have been prescribed to you. Take any new medicine only after you have completely understood and accepted all the possible adverse reactions/side effects.   Do not drive, operate heavy machinery, perform activities at heights, swimming or participation in water activities or provide baby sitting services if your were admitted for syncope or siezures until you have seen by Primary MD or a Neurologist and advised to do so again.  Do not drive when taking Pain medications.  Do not take more than prescribed Pain, Sleep  and Anxiety Medications  Wear Seat belts while driving.  Please note You were cared for by a hospitalist during your hospital stay. If you have any questions about your discharge medications or the care you received while you were in the hospital after you are discharged, you can call the unit and asked to speak with  the hospitalist on call if the hospitalist that took care of you is not available. Once you are discharged, your primary care physician will handle any further medical issues. Please note that NO REFILLS for any discharge medications will be authorized once you are discharged, as it is imperative that you return to your primary care physician (or establish a relationship with a primary care physician if you do not have one) for your aftercare needs so that they can reassess your need for medications and monitor your lab values.   Increase activity slowly   Complete by: As directed        Follow ups:    Follow-up Information     [name], MD Follow up.   Specialty: Family Medicine Contact information: [address] [place] [address] [phone]                 Discharge Exam:   Vitals:   08/24/21 0425 08/24/21 0431 08/24/21 0728 08/24/21 1031  BP:  (!) 99/44 (!) 85/64 100/71  Pulse:  95 99   Resp:  18 18   Temp:  (!) 97.5 F (36.4 C) 98.1 F (36.7 C)   TempSrc:   Oral   SpO2:  100% 93%   Weight: 79.1 kg     Height:        Body mass index is 25.74 kg/m.  General exam: Pleasant, elderly African-American male.  Not in distress Skin: No rashes, lesions or ulcers. HEENT: Atraumatic, normocephalic, no obvious bleeding Lungs: Clear to auscultation bilaterally CVS: Rate controlled A. fib, no murmur GI/Abd soft, nontender, nondistended, bowel sound present CNS: Alert, awake, oriented x3 Psychiatry: Mood appropriate Extremities: Pedal edema trace to 1+ bilaterally, no calf tenderness  Time coordinating discharge: 35  minutes   The results of significant diagnostics from this hospitalization (including imaging, microbiology, ancillary and laboratory) are listed below for reference.    Procedures and Diagnostic Studies:   ECHOCARDIOGRAM COMPLETE  Result Date: 08/24/2021    ECHOCARDIOGRAM REPORT   Patient Name:   Isaac [name] Date of Exam: 08/23/2021 Medical Rec #:  [id number]           Height:       69.0 in Accession #:    [id number]          Weight:       175.0 lb Date of Birth:  [birth year]            BSA:          1.952 m Patient Age:    89 years            BP:           101/86 mmHg Patient Gender: M                   HR:           106 bpm. Exam Location:  [facility] Procedure: 2D Echo and Intracardiac Opacification Agent Indications:     Systolic CHF  History:         Patient has no prior history of Echocardiogram examinations.                  CAD and Previous Myocardial Infarction, Defibrillator,                  Arrythmias:Atrial Fibrillation; Risk Factors:Hypertension and  Former Smoker.  Sonographer:     L Thornton-Maynard Referring Phys:  [id number] MATTHEW M ECKSTAT Diagnosing Phys: Yolonda Kida MD IMPRESSIONS  1. Left ventricular ejection fraction, by estimation, is <20%. The left ventricle has severely decreased function. The left ventricle demonstrates global hypokinesis. The left ventricular internal cavity size was severely dilated. Left ventricular diastolic parameters are consistent with Grade III diastolic dysfunction (restrictive). Elevated left ventricular end-diastolic pressure.  2. Right ventricular systolic function is severely reduced. The right ventricular size is moderately enlarged. There is moderately elevated pulmonary artery systolic pressure.  3. Left atrial size was mild to moderately dilated.  4. Right atrial size was mild to moderately dilated.  5. The mitral valve is grossly normal. Moderate mitral valve regurgitation.  6. Tricuspid valve regurgitation is moderate to  severe.  7. The aortic valve is grossly normal. Aortic valve regurgitation is not visualized. FINDINGS  Left Ventricle: Left ventricular ejection fraction, by estimation, is <20%. The left ventricle has severely decreased function. The left ventricle demonstrates global hypokinesis. Definity contrast agent was given IV to delineate the left ventricular endocardial borders. The left ventricular internal cavity size was severely dilated. There is no left ventricular hypertrophy. Left ventricular diastolic parameters are consistent with Grade III diastolic dysfunction (restrictive). Elevated left ventricular end-diastolic pressure. Right Ventricle: The right ventricular size is moderately enlarged. No increase in right ventricular wall thickness. Right ventricular systolic function is severely reduced. There is moderately elevated pulmonary artery systolic pressure. The tricuspid regurgitant velocity is 3.18 m/s, and with an assumed right atrial pressure of 15 mmHg, the estimated right ventricular systolic pressure is 45.4 mmHg. Left Atrium: Left atrial size was mild to moderately dilated. Right Atrium: Right atrial size was mild to moderately dilated. Pericardium: Trivial pericardial effusion is present. Mitral Valve: The mitral valve is grossly normal. The E-point septal separation is normal. Moderate mitral valve regurgitation. MV peak gradient, 3.1 mmHg. The mean mitral valve gradient is 2.0 mmHg. Tricuspid Valve: The tricuspid valve is grossly normal. Tricuspid valve regurgitation is moderate to severe. Aortic Valve: The aortic valve is grossly normal. Aortic valve regurgitation is not visualized. Aortic valve peak gradient measures 3.5 mmHg. Pulmonic Valve: The pulmonic valve was normal in structure. Pulmonic valve regurgitation is trivial. Aorta: The ascending aorta was not well visualized. IAS/Shunts: No atrial level shunt detected by color flow Doppler. Additional Comments: A device lead is visualized. There  is a small pleural effusion in the left lateral region.  LEFT VENTRICLE PLAX 2D LVIDd:         6.01 cm      Diastology LVIDs:         5.85 cm      LV e' medial:    2.25 cm/s LV PW:         0.79 cm      LV E/e' medial:  39.0 LV IVS:        0.96 cm      LV e' lateral:   5.40 cm/s LVOT diam:     2.30 cm      LV E/e' lateral: 16.3 LV SV:         27 LV SV Index:   14 LVOT Area:     4.15 cm  LV Volumes (MOD) LV vol d, MOD A2C: 256.0 ml LV vol d, MOD A4C: 132.5 ml LV vol s, MOD A2C: 163.0 ml LV vol s, MOD A4C: 121.0 ml LV SV MOD A2C:     93.0 ml LV  SV MOD A4C:     132.5 ml LV SV MOD BP:      40.3 ml RIGHT VENTRICLE RV S prime:     4.33 cm/s TAPSE (M-mode): 0.9 cm LEFT ATRIUM             Index        RIGHT ATRIUM           Index LA diam:        4.50 cm 2.31 cm/m   RA Area:     24.00 cm LA Vol (A2C):   78.3 ml 40.11 ml/m  RA Volume:   77.90 ml  39.91 ml/m LA Vol (A4C):   88.8 ml 45.49 ml/m LA Biplane Vol: 89.2 ml 45.70 ml/m  AORTIC VALVE                 PULMONIC VALVE AV Area (Vmax): 1.96 cm     PV Vmax:          0.43 m/s AV Vmax:        93.00 cm/s   PV Peak grad:     0.7 mmHg AV Peak Grad:   3.5 mmHg     PR End Diast Vel: 5.81 msec LVOT Vmax:      43.85 cm/s LVOT Vmean:     28.100 cm/s LVOT VTI:       0.066 m  AORTA Ao Root diam: 3.90 cm Ao Asc diam:  4.00 cm MITRAL VALVE               TRICUSPID VALVE MV Area (PHT): 7.16 cm    TR Peak grad:   40.4 mmHg MV Area VTI:   1.47 cm    TR Vmax:        318.00 cm/s MV Peak grad:  3.1 mmHg MV Mean grad:  2.0 mmHg    SHUNTS MV Vmax:       0.88 m/s    Systemic VTI:  0.07 m MV Vmean:      66.3 cm/s   Systemic Diam: 2.30 cm MV Decel Time: 106 msec MV E velocity: 87.80 cm/s Yolonda Kida MD Electronically signed by Yolonda Kida MD Signature Date/Time: 08/24/2021/9:33:53 AM    Final      Labs:   Basic Metabolic Panel: Recent Labs  Lab 08/22/21 0510 08/22/21 1209 08/23/21 0455 08/24/21 0605  NA 136  --  139 135  K 3.8  --  4.7 4.7  CL 105  --  106 103  CO2  26  --  25 24  GLUCOSE 110*  --  96 93  BUN 28*  --  27* 28*  CREATININE 1.52*  --  1.37* 1.65*  CALCIUM 8.8*  --  8.9 8.9  MG  --  2.1 2.1  --    GFR Estimated Creatinine Clearance: 42.3 mL/min (A) (by C-G formula based on SCr of 1.65 mg/dL (H)). Liver Function Tests: No results for input(s): AST, ALT, ALKPHOS, BILITOT, PROT, ALBUMIN in the last 168 hours. No results for input(s): LIPASE, AMYLASE in the last 168 hours. No results for input(s): AMMONIA in the last 168 hours. Coagulation profile No results for input(s): INR, PROTIME in the last 168 hours.  CBC: Recent Labs  Lab 08/22/21 0510 08/23/21 0455 08/24/21 0605  WBC 6.9 6.9 7.5  NEUTROABS  --   --  2.3  HGB 15.0 14.5 13.7  HCT 46.0 44.0 41.3  MCV 93.1 92.6 92.2  PLT 351 347 328   Cardiac  Enzymes: No results for input(s): CKTOTAL, CKMB, CKMBINDEX, TROPONINI in the last 168 hours. BNP: Invalid input(s): POCBNP CBG: No results for input(s): GLUCAP in the last 168 hours. D-Dimer No results for input(s): DDIMER in the last 72 hours. Hgb A1c No results for input(s): HGBA1C in the last 72 hours. Lipid Profile Recent Labs    08/24/21 0605  CHOL 101  HDL 28*  LDLCALC 64  TRIG 44  CHOLHDL 3.6   Thyroid function studies Recent Labs    08/23/21 0455  TSH 3.653   Anemia work up No results for input(s): VITAMINB12, FOLATE, FERRITIN, TIBC, IRON, RETICCTPCT in the last 72 hours. Microbiology Recent Results (from the past 240 hour(s))  Resp Panel by RT-PCR (Flu A&B, Covid) Nasopharyngeal Swab     Status: None   Collection Time: 08/22/21 12:09 PM   Specimen: Nasopharyngeal Swab; Nasopharyngeal(NP) swabs in vial transport medium  Result Value Ref Range Status   SARS Coronavirus 2 by RT PCR NEGATIVE NEGATIVE Final    Comment: (NOTE) SARS-CoV-2 target nucleic acids are NOT DETECTED.  The SARS-CoV-2 RNA is generally detectable in upper respiratory specimens during the acute phase of infection. The  lowest concentration of SARS-CoV-2 viral copies this assay can detect is 138 copies/mL. A negative result does not preclude SARS-Cov-2 infection and should not be used as the sole basis for treatment or other patient management decisions. A negative result may occur with  improper specimen collection/handling, submission of specimen other than nasopharyngeal swab, presence of viral mutation(s) within the areas targeted by this assay, and inadequate number of viral copies(<138 copies/mL). A negative result must be combined with clinical observations, patient history, and epidemiological information. The expected result is Negative.  Fact Sheet for Patients:  EntrepreneurPulse.com.au  Fact Sheet for Healthcare Providers:  IncredibleEmployment.be  This test is no t yet approved or cleared by the [place] FDA and  has been authorized for detection and/or diagnosis of SARS-CoV-2 by FDA under an Emergency Use Authorization (EUA). This EUA will remain  in effect (meaning this test can be used) for the duration of the COVID-19 declaration under Section 564(b)(1) of the Act, 21 U.S.C.section 360bbb-3(b)(1), unless the authorization is terminated  or revoked sooner.       Influenza A by PCR NEGATIVE NEGATIVE Final   Influenza B by PCR NEGATIVE NEGATIVE Final    Comment: (NOTE) The Xpert Xpress SARS-CoV-2/FLU/RSV plus assay is intended as an aid in the diagnosis of influenza from Nasopharyngeal swab specimens and should not be used as a sole basis for treatment. Nasal washings and aspirates are unacceptable for Xpert Xpress SARS-CoV-2/FLU/RSV testing.  Fact Sheet for Patients: EntrepreneurPulse.com.au  Fact Sheet for Healthcare Providers: IncredibleEmployment.be  This test is not yet approved or cleared by the [place] FDA and has been authorized for detection and/or diagnosis of SARS-CoV-2 by FDA under  an Emergency Use Authorization (EUA). This EUA will remain in effect (meaning this test can be used) for the duration of the COVID-19 declaration under Section 564(b)(1) of the Act, 21 U.S.C. section 360bbb-3(b)(1), unless the authorization is terminated or revoked.  Performed at [facility], [address] [place], [place] [address]      Signed: Terrilee Croak  [facility] 08/24/2021, 12:07 PM     Physical Exam:   Data Review: I have personally reviewed the laboratory data and studies available.  F/u labs ordered Unresulted Labs (From admission, onward)     Start     Ordered   08/24/21 [id number]  Pathologist smear review  Once,   AD        08/24/21 0605   08/24/21 0500  CBC with Differential/Platelet  Daily,   R      08/23/21 1216   08/24/21 2330  Basic metabolic panel  Daily,   R      08/23/21 1216   08/23/21 0500  Hemoglobin A1c  Tomorrow morning,   STAT        08/22/21 1417            Signed, Terrilee Croak, MD [facility] 08/24/2021

## 2021-08-24 NOTE — TOC Transition Note (Signed)
Transition of Care [facility]) - CM/SW Discharge Note   Patient Details  Name: Sayeed Weatherall MRN: [id number] Date of Birth: 04-04-1951  Transition of Care [facility]) CM/SW Contact:  Izola Price, RN Phone Number: 08/24/2021, 1:15 PM   Clinical Narrative:  Patient is being discharged today.   To follow up with PCP in one week.  Per DC Summary: Discharge instructions for CHF Check weight daily -preferably same time every day. Restrict fluid intake to 1200 ml daily Restrict salt intake to less than 2 g daily. Call MD if you have one of the following symptoms 1) 3 pound weight gain in 24 hours or 5 pounds in 1 week  2) swelling in the hands, feet or stomach  3) progressive shortness of breath 4) if you have to sleep on extra pillows at night in order to breathe No PT/OT recs for post discharge.  Simmie Davies RN CM     Final next level of care: Home/Self Care Barriers to Discharge: Barriers Resolved   Patient Goals and CMS Choice        Discharge Placement                       Discharge Plan and Services                DME Arranged: N/A DME Agency: NA       HH Arranged: NA HH Agency: NA        Social Determinants of Health (SDOH) Interventions     Readmission Risk Interventions No flowsheet data found.

## 2021-08-25 LAB — CBC WITH DIFFERENTIAL/PLATELET
Abs Immature Granulocytes: 0.01 10*3/uL (ref 0.00–0.07)
Basophils Absolute: 0.1 10*3/uL (ref 0.0–0.1)
Basophils Relative: 1 %
Eosinophils Absolute: 2.5 10*3/uL — ABNORMAL HIGH (ref 0.0–0.5)
Eosinophils Relative: 41 %
HCT: 42.6 % (ref 39.0–52.0)
Hemoglobin: 14.3 g/dL (ref 13.0–17.0)
Immature Granulocytes: 0 %
Lymphocytes Relative: 18 %
Lymphs Abs: 1.1 10*3/uL (ref 0.7–4.0)
MCH: 30.7 pg (ref 26.0–34.0)
MCHC: 33.6 g/dL (ref 30.0–36.0)
MCV: 91.4 fL (ref 80.0–100.0)
Monocytes Absolute: 0.5 10*3/uL (ref 0.1–1.0)
Monocytes Relative: 8 %
Neutro Abs: 2 10*3/uL (ref 1.7–7.7)
Neutrophils Relative %: 32 %
Platelets: 351 10*3/uL (ref 150–400)
RBC: 4.66 MIL/uL (ref 4.22–5.81)
RDW: 16 % — ABNORMAL HIGH (ref 11.5–15.5)
WBC: 6.2 10*3/uL (ref 4.0–10.5)
nRBC: 0 % (ref 0.0–0.2)

## 2021-08-25 LAB — BASIC METABOLIC PANEL
Anion gap: 11 (ref 5–15)
BUN: 37 mg/dL — ABNORMAL HIGH (ref 8–23)
CO2: 21 mmol/L — ABNORMAL LOW (ref 22–32)
Calcium: 9.1 mg/dL (ref 8.9–10.3)
Chloride: 105 mmol/L (ref 98–111)
Creatinine, Ser: 1.89 mg/dL — ABNORMAL HIGH (ref 0.61–1.24)
GFR, Estimated: 38 mL/min — ABNORMAL LOW (ref >60)
Glucose, Bld: 92 mg/dL (ref 70–99)
Potassium: 5.7 mmol/L — ABNORMAL HIGH (ref 3.5–5.1)
Sodium: 137 mmol/L (ref 135–145)

## 2021-08-25 LAB — HEMOGLOBIN A1C
Hgb A1c MFr Bld: 6.1 % — ABNORMAL HIGH (ref 4.8–5.6)
Mean Plasma Glucose: 128 mg/dL

## 2021-08-25 LAB — PATHOLOGIST SMEAR REVIEW

## 2021-08-25 MED ORDER — PATIROMER SORBITEX CALCIUM 8.4 G PO PACK
8.4000 g | PACK | Freq: Every day | ORAL | Status: DC
  Administered 2021-08-25: 8.4 g via ORAL
  Filled 2021-08-25: qty 1

## 2021-08-25 MED ORDER — SODIUM CHLORIDE 0.9 % IV BOLUS
250.0000 mL | Freq: Once | INTRAVENOUS | Status: AC
  Administered 2021-08-25: 250 mL via INTRAVENOUS

## 2021-08-25 NOTE — Progress Notes (Addendum)
[facility] Cardiology    SUBJECTIVE: Patient feels much better doing reasonably well no shortness of breath no lightheadedness no vertigo feels well enough to go home   Vitals:   08/24/21 2146 08/25/21 0015 08/25/21 0445 08/25/21 0803  BP: (!) 82/63 94/74 94/72  99/80  Pulse: 83 98 79 98  Resp:    17  Temp:  98.2 F (36.8 C) 97.7 F (36.5 C) 98 F (36.7 C)  TempSrc:      SpO2: 98% 98% 99% 96%  Weight:   79 kg   Height:         Intake/Output Summary (Last 24 hours) at 08/25/2021 0840 Last data filed at 08/24/2021 1653 Gross per 24 hour  Intake 740 ml  Output --  Net 740 ml      PHYSICAL EXAM  General: Well developed, well nourished, in no acute distress HEENT:  Normocephalic and atramatic Neck:  No JVD.  Lungs: Clear bilaterally to auscultation and percussion. Heart: HRRR . Normal S1 and S2 without gallops or murmurs.  Abdomen: Bowel sounds are positive, abdomen soft and non-tender  Msk:  Back normal, normal gait. Normal strength and tone for age. Extremities: No clubbing, cyanosis or edema.   Neuro: Alert and oriented X 3. Psych:  Good affect, responds appropriately   LABS: Basic Metabolic Panel: Recent Labs    08/22/21 1209 08/23/21 0455 08/23/21 0455 08/24/21 0605 08/25/21 0500  NA  --  139   < > 135 137  K  --  4.7   < > 4.7 5.7*  CL  --  106   < > 103 105  CO2  --  25   < > 24 21*  GLUCOSE  --  96   < > 93 92  BUN  --  27*   < > 28* 37*  CREATININE  --  1.37*   < > 1.65* 1.89*  CALCIUM  --  8.9   < > 8.9 9.1  MG 2.1 2.1  --   --   --    < > = values in this interval not displayed.   Liver Function Tests: No results for input(s): AST, ALT, ALKPHOS, BILITOT, PROT, ALBUMIN in the last 72 hours. No results for input(s): LIPASE, AMYLASE in the last 72 hours. CBC: Recent Labs    08/24/21 0605 08/25/21 0500  WBC 7.5 6.2  NEUTROABS 2.3 2.0  HGB 13.7 14.3  HCT 41.3 42.6  MCV 92.2 91.4  PLT 328 351   Cardiac Enzymes: No results for input(s): CKTOTAL,  CKMB, CKMBINDEX, TROPONINI in the last 72 hours. BNP: Invalid input(s): POCBNP D-Dimer: No results for input(s): DDIMER in the last 72 hours. Hemoglobin A1C: No results for input(s): HGBA1C in the last 72 hours. Fasting Lipid Panel: Recent Labs    08/24/21 0605  CHOL 101  HDL 28*  LDLCALC 64  TRIG 44  CHOLHDL 3.6   Thyroid Function Tests: Recent Labs    08/23/21 0455  TSH 3.653   Anemia Panel: No results for input(s): VITAMINB12, FOLATE, FERRITIN, TIBC, IRON, RETICCTPCT in the last 72 hours.  ECHOCARDIOGRAM COMPLETE  Result Date: 08/24/2021    ECHOCARDIOGRAM REPORT   Patient Name:   Isaac [name] Date of Exam: 08/23/2021 Medical Rec #:  [id number]           Height:       69.0 in Accession #:    [id number]          Weight:  175.0 lb Date of Birth:  02/10/1951            BSA:          1.952 m Patient Age:    70 years            BP:           101/86 mmHg Patient Gender: M                   HR:           106 bpm. Exam Location:  [facility] Procedure: 2D Echo and Intracardiac Opacification Agent Indications:     Systolic CHF  History:         Patient has no prior history of Echocardiogram examinations.                  CAD and Previous Myocardial Infarction, Defibrillator,                  Arrythmias:Atrial Fibrillation; Risk Factors:Hypertension and                  Former Smoker.  Sonographer:     L Thornton-Maynard Referring Phys:  [id number] MATTHEW M ECKSTAT Diagnosing Phys: Yolonda Kida MD IMPRESSIONS  1. Left ventricular ejection fraction, by estimation, is <20%. The left ventricle has severely decreased function. The left ventricle demonstrates global hypokinesis. The left ventricular internal cavity size was severely dilated. Left ventricular diastolic parameters are consistent with Grade III diastolic dysfunction (restrictive). Elevated left ventricular end-diastolic pressure.  2. Right ventricular systolic function is severely reduced. The right ventricular size is moderately  enlarged. There is moderately elevated pulmonary artery systolic pressure.  3. Left atrial size was mild to moderately dilated.  4. Right atrial size was mild to moderately dilated.  5. The mitral valve is grossly normal. Moderate mitral valve regurgitation.  6. Tricuspid valve regurgitation is moderate to severe.  7. The aortic valve is grossly normal. Aortic valve regurgitation is not visualized. FINDINGS  Left Ventricle: Left ventricular ejection fraction, by estimation, is <20%. The left ventricle has severely decreased function. The left ventricle demonstrates global hypokinesis. Definity contrast agent was given IV to delineate the left ventricular endocardial borders. The left ventricular internal cavity size was severely dilated. There is no left ventricular hypertrophy. Left ventricular diastolic parameters are consistent with Grade III diastolic dysfunction (restrictive). Elevated left ventricular end-diastolic pressure. Right Ventricle: The right ventricular size is moderately enlarged. No increase in right ventricular wall thickness. Right ventricular systolic function is severely reduced. There is moderately elevated pulmonary artery systolic pressure. The tricuspid regurgitant velocity is 3.18 m/s, and with an assumed right atrial pressure of 15 mmHg, the estimated right ventricular systolic pressure is 93.2 mmHg. Left Atrium: Left atrial size was mild to moderately dilated. Right Atrium: Right atrial size was mild to moderately dilated. Pericardium: Trivial pericardial effusion is present. Mitral Valve: The mitral valve is grossly normal. The E-point septal separation is normal. Moderate mitral valve regurgitation. MV peak gradient, 3.1 mmHg. The mean mitral valve gradient is 2.0 mmHg. Tricuspid Valve: The tricuspid valve is grossly normal. Tricuspid valve regurgitation is moderate to severe. Aortic Valve: The aortic valve is grossly normal. Aortic valve regurgitation is not visualized. Aortic valve  peak gradient measures 3.5 mmHg. Pulmonic Valve: The pulmonic valve was normal in structure. Pulmonic valve regurgitation is trivial. Aorta: The ascending aorta was not well visualized. IAS/Shunts: No atrial level shunt detected by color flow Doppler. Additional  Comments: A device lead is visualized. There is a small pleural effusion in the left lateral region.  LEFT VENTRICLE PLAX 2D LVIDd:         6.01 cm      Diastology LVIDs:         5.85 cm      LV e' medial:    2.25 cm/s LV PW:         0.79 cm      LV E/e' medial:  39.0 LV IVS:        0.96 cm      LV e' lateral:   5.40 cm/s LVOT diam:     2.30 cm      LV E/e' lateral: 16.3 LV SV:         27 LV SV Index:   14 LVOT Area:     4.15 cm  LV Volumes (MOD) LV vol d, MOD A2C: 256.0 ml LV vol d, MOD A4C: 132.5 ml LV vol s, MOD A2C: 163.0 ml LV vol s, MOD A4C: 121.0 ml LV SV MOD A2C:     93.0 ml LV SV MOD A4C:     132.5 ml LV SV MOD BP:      40.3 ml RIGHT VENTRICLE RV S prime:     4.33 cm/s TAPSE (M-mode): 0.9 cm LEFT ATRIUM             Index        RIGHT ATRIUM           Index LA diam:        4.50 cm 2.31 cm/m   RA Area:     24.00 cm LA Vol (A2C):   78.3 ml 40.11 ml/m  RA Volume:   77.90 ml  39.91 ml/m LA Vol (A4C):   88.8 ml 45.49 ml/m LA Biplane Vol: 89.2 ml 45.70 ml/m  AORTIC VALVE                 PULMONIC VALVE AV Area (Vmax): 1.96 cm     PV Vmax:          0.43 m/s AV Vmax:        93.00 cm/s   PV Peak grad:     0.7 mmHg AV Peak Grad:   3.5 mmHg     PR End Diast Vel: 5.81 msec LVOT Vmax:      43.85 cm/s LVOT Vmean:     28.100 cm/s LVOT VTI:       0.066 m  AORTA Ao Root diam: 3.90 cm Ao Asc diam:  4.00 cm MITRAL VALVE               TRICUSPID VALVE MV Area (PHT): 7.16 cm    TR Peak grad:   40.4 mmHg MV Area VTI:   1.47 cm    TR Vmax:        318.00 cm/s MV Peak grad:  3.1 mmHg MV Mean grad:  2.0 mmHg    SHUNTS MV Vmax:       0.88 m/s    Systemic VTI:  0.07 m MV Vmean:      66.3 cm/s   Systemic Diam: 2.30 cm MV Decel Time: 106 msec MV E velocity: 87.80 cm/s  Nurah Petrides D Brady Schiller MD Electronically signed by Yolonda Kida MD Signature Date/Time: 08/24/2021/9:33:53 AM    Final      Echo severely depressed left ventricular function less than 20%  TELEMETRY: Paced at about 100-110:  ASSESSMENT AND PLAN:  Principal Problem:   Acute on chronic systolic (congestive) heart failure (HCC) Active Problems:   Acute on chronic systolic CHF (congestive heart failure), NYHA class 3 (HCC)   CAD (coronary artery disease)   Cardiomyopathy (HCC)   A-fib (HCC)   Elevated troponin Generalized weakness Hypotension Hyperkalemia  Plan Patient feels much improved this morning Mild evidence of acute on chronic renal sufficiency Commend small bolus of 250 of normal saline to help with hydration Continue to make Lasix as needed We will hold Entresto and spironolactone for now until seen by cardiology Continue anticoagulation for atrial fibrillation Maintain amiodarone load 200 twice a day Recommend 1 dose of Veltassa prior to discharge Patient will need repeat chemistries this week Hypotension much improved last systolic blood pressure was 95 Follow-up with cardiology this week  Meds Fold D/C Amiodarone 200 twice a day Eliquis 5 mg twice a day Lasix 20 mg daily as needed Metoprolol tartrate 12.5 twice a day Midodrine 2.5mg   3 times a day Hold Entresto for now because of hyperkalemia renal insufficiency Hold spironolactone because of hyperkalemia renal insufficiency Follow-up with cardiology to reevaluate medical management this week  Yolonda Kida, MD 08/25/2021 8:40 AM

## 2021-08-25 NOTE — Care Management Important Message (Signed)
Important Message  Patient Details  Name: Isaac [name] MRN: [id number] Date of Birth: 07/18/1951   Medicare Important Message Given:  Yes     Dannette Barbara 08/25/2021, 12:20 PM

## 2021-08-25 NOTE — Discharge Summary (Signed)
Physician Discharge Summary  Isaac [name] DUK:[id number] DOB: 11/24/1950 DOA: 08/22/2021  PCP: Isaac Pink, MD  Admit date: 08/22/2021 Discharge date: 08/25/2021  Admitted From: Home Discharge disposition: Home   Code Status: DNR   Discharge Diagnosis:   Principal Problem:   Acute on chronic systolic (congestive) heart failure (HCC) Active Problems:   Acute on chronic systolic CHF (congestive heart failure), NYHA class 3 (HCC)   CAD (coronary artery disease)   Cardiomyopathy ([place])   A-fib ([place])   Elevated troponin    Chief Complaint  Patient presents with   Shortness of Breath    Brief narrative: Isaac [name] is a 70 y.o. male with PMH significant for HTN, HLD, CAD with stents, chronic systolic CHF s/p AICD, BPH Patient presented to the ED on 12/2 with complaint of progressively worsening shortness of breath, pedal edema, exercise tolerance for 2 weeks. Last seen by cardiologist on July 09, 2021.  Early November, patient had acute gout and was treated with steroids.  Seen by PCP on 11/30 for worsening shortness of breath and pedal edema.  His symptoms continued to worsen despite compliance to his medications and a strict dietary adherence  In the ED, patient was afebrile, tachycardic to 161, blood pressure normal, O2 sat 99% on room air. Labs with CBC unremarkable, creatinine elevated 1.52, BNP elevated to 1600.  Troponin elevated to 22 and 24. Chest x-ray showed new left-sided pleural effusion and findings consistent with pulmonary edema.  ECG showed new Afib with RVR but no new ischemic changes compared to prior. Patient was started on IV Cardizem drip, IV Lasix and admitted to hospitalist service.    Subjective: Patient was seen and examined this morning.   Feels better than yesterday.  Blood pressure improving. Cardiology follow-up appreciated.  Assessment/Plan: Acute on chronic systolic heart failure -Presented with progressively worsening  dyspnea, lower extremity edema, exercise intolerance in the setting of severe systolic CHF -BNP was elevated.  Chest x-ray showed pulm edema. -Unclear precipitating factor.  Unsure if A. fib was the cause or consequence of heart failure exacerbation. -Home meds include Lasix, Entresto, Aldactone.  -In the hospital, he was initially given IV Lasix which helped him diuresed but his blood pressure dropped down.  He required fluid boluses and medication titration. -Cardiology consultation follow-up appreciated.  Medications at discharge were adjusted based on his patient is low blood pressure, worsening creatinine, and elevated potassium level. -Heart failure regimen at discharge will be metoprolol tartrate 12.5 mg twice daily, Lasix 20 mg daily as needed, midodrine 2.5 mg 3 times daily for blood pressure support.  Patient will hold Entresto, Aldactone till he sees cardiology as an outpatient.  Atrial fibrillation with RVR -It seems patient did not have pre-existing diagnosis of A. fib. -Presented with A. fib with RVR with heart rate up to 160s in the setting of CHF exacerbation -He was initially started on Cardizem drip.  Cardiology recommended metoprolol and amiodarone.  Discharge on metoprolol 12.5 mg daily and amiodarone 200 mg twice daily. -CHADSVASC score of 4.  Started on Eliquis.  AKI on CKD 3a -Creatinine worsened because of Lasix. -At discharge, Entresto and Aldactone are held. -Continue to follow renal function as an outpatient with cardiology follow-up Recent Labs    08/22/21 0510 08/23/21 0455 08/24/21 0605 08/25/21 0500  BUN 28* 27* 28* 37*  CREATININE 1.52* 1.37* 1.65* 1.89*   Hyperkalemia -Potassium level elevated to 5.7 today.  1 dose of Lokelma was given.  Entresto and Aldactone on  hold at discharge. Recent Labs  Lab 08/22/21 0510 08/22/21 1209 08/23/21 0455 08/24/21 0605 08/25/21 0500  K 3.8  --  4.7 4.7 5.7*  MG  --  2.1 2.1  --   --     Elevated troponin CAD  status post stents -Mild troponin elevation probably due to LV strain -Has history of stent placement twice, 2007 and 2013. -Home meds include aspirin 81 mg daily.  He states he was previously on Plavix for a year after his stent.  He is not sure why is not on a statin.  LDL 64, HDL 28 today. -Since he has been started on Eliquis, aspirin has been stopped.  Ischemic cardiomyopathy -Status post AICD  BPH -Flomax  Asthma/allergies -continue albuterol as needed, daily fluticasone, montelukast  Discharge Medications:   Allergies as of 08/25/2021       Reactions   Mushroom Extract Complex Nausea And Vomiting   Neosporin [neomycin-bacitracin Zn-polymyx] Hives        Medication List     STOP taking these medications    aspirin 81 MG EC tablet   aspirin EC 325 MG tablet   carvedilol 80 MG 24 hr capsule Commonly known as: COREG CR   cetirizine 10 MG tablet Commonly known as: ZYRTEC   clopidogrel 75 MG tablet Commonly known as: PLAVIX   Entresto 24-26 MG Generic drug: sacubitril-valsartan   HYDROcodone-acetaminophen 5-325 MG tablet Commonly known as: NORCO/VICODIN   olmesartan 40 MG tablet Commonly known as: BENICAR   sildenafil 100 MG tablet Commonly known as: VIAGRA       TAKE these medications    albuterol 108 (90 Base) MCG/ACT inhaler Commonly known as: VENTOLIN HFA Inhale 2 puffs into the lungs every 6 (six) hours as needed.   amiodarone 200 MG tablet Commonly known as: PACERONE Take 1 tablet (200 mg total) by mouth 2 (two) times daily.   apixaban 5 MG Tabs tablet Commonly known as: ELIQUIS Take 1 tablet (5 mg total) by mouth 2 (two) times daily.   fluticasone 50 MCG/ACT nasal spray Commonly known as: FLONASE Place 2 sprays into both nostrils daily.   furosemide 20 MG tablet Commonly known as: LASIX Take 1 tablet (20 mg total) by mouth daily as needed for edema or fluid (sob). What changed:  medication strength how much to take when to take  this reasons to take this   metoprolol tartrate 25 MG tablet Commonly known as: LOPRESSOR Take 0.5 tablets (12.5 mg total) by mouth 2 (two) times daily.   midodrine 2.5 MG tablet Commonly known as: PROAMATINE Take 1 tablet (2.5 mg total) by mouth 3 (three) times daily with meals.   montelukast 10 MG tablet Commonly known as: SINGULAIR Take by mouth.   spironolactone 25 MG tablet Commonly known as: ALDACTONE Take 0.5 tablets (12.5 mg total) by mouth daily. What changed: how much to take   tamsulosin 0.4 MG Caps capsule Commonly known as: FLOMAX TAKE 1 CAPSULE (0.4 MG TOTAL) BY MOUTH DAILY AFTER BREAKFAST.        Wound care:     Discharge Instructions:   Discharge Instructions     (HEART FAILURE PATIENTS) Call MD:  Anytime you have any of the following symptoms: 1) 3 pound weight gain in 24 hours or 5 pounds in 1 week 2) shortness of breath, with or without a dry hacking cough 3) swelling in the hands, feet or stomach 4) if you have to sleep on extra pillows at night in order to breathe.  Complete by: As directed    Amb referral to AFIB Clinic   Complete by: As directed    Call MD for:  difficulty breathing, headache or visual disturbances   Complete by: As directed    Call MD for:  extreme fatigue   Complete by: As directed    Call MD for:  hives   Complete by: As directed    Call MD for:  persistant dizziness or light-headedness   Complete by: As directed    Call MD for:  persistant nausea and vomiting   Complete by: As directed    Call MD for:  severe uncontrolled pain   Complete by: As directed    Call MD for:  temperature >100.4   Complete by: As directed    Diet - low sodium heart healthy   Complete by: As directed    Discharge instructions   Complete by: As directed    Discharge instructions for CHF Check weight daily -preferably same time every day. Restrict fluid intake to 1200 ml daily Restrict salt intake to less than 2 g daily. Call MD if you  have one of the following symptoms 1) 3 pound weight gain in 24 hours or 5 pounds in 1 week  2) swelling in the hands, feet or stomach  3) progressive shortness of breath 4) if you have to sleep on extra pillows at night in order to breathe     General discharge instructions:  Follow with Primary MD Isaac Pink, MD in 7 days   Get CBC/BMP checked in next visit within 1 week by PCP or SNF MD. (We routinely change or add medications that can affect your baseline labs and fluid status, therefore we recommend that you get the mentioned basic workup next visit with your PCP, your PCP may decide not to get them or add new tests based on their clinical decision)  On your next visit with your PCP, please get your medicines reviewed and adjusted.  Please request your PCP  to go over all hospital tests, procedures, radiology results at the follow up, please get all Hospital records sent to your PCP by signing hospital release before you go home.  Activity: As tolerated with Full fall precautions use walker/cane & assistance as needed  Avoid using any recreational substances like cigarette, tobacco, alcohol, or non-prescribed drug.  If you experience worsening of your admission symptoms, develop shortness of breath, life threatening emergency, suicidal or homicidal thoughts you must seek medical attention immediately by calling 911 or calling your MD immediately  if symptoms less severe.  You must read complete instructions/literature along with all the possible adverse reactions/side effects for all the medicines you take and that have been prescribed to you. Take any new medicine only after you have completely understood and accepted all the possible adverse reactions/side effects.   Do not drive, operate heavy machinery, perform activities at heights, swimming or participation in water activities or provide baby sitting services if your were admitted for syncope or siezures until you have seen  by Primary MD or a Neurologist and advised to do so again.  Do not drive when taking Pain medications.  Do not take more than prescribed Pain, Sleep and Anxiety Medications  Wear Seat belts while driving.  Please note You were cared for by a hospitalist during your hospital stay. If you have any questions about your discharge medications or the care you received while you were in the hospital after you are discharged, you can  call the unit and asked to speak with the hospitalist on call if the hospitalist that took care of you is not available. Once you are discharged, your primary care physician will handle any further medical issues. Please note that NO REFILLS for any discharge medications will be authorized once you are discharged, as it is imperative that you return to your primary care physician (or establish a relationship with a primary care physician if you do not have one) for your aftercare needs so that they can reassess your need for medications and monitor your lab values.   Increase activity slowly   Complete by: As directed        Follow ups:    Follow-up Information     Isaac Pink, MD Follow up.   Specialty: Family Medicine Contact information: [address] [place] [address] [phone]         Yolonda Kida, MD Follow up.   Specialties: Cardiology, Internal Medicine Contact information: [place] [address] [phone]                 Discharge Exam:   Vitals:   08/25/21 0015 08/25/21 0445 08/25/21 0803 08/25/21 1140  BP: 94/74 94/72 99/80  103/76  Pulse: 98 79 98 100  Resp:   17 17  Temp: 98.2 F (36.8 C) 97.7 F (36.5 C) 98 F (36.7 C) 98.3 F (36.8 C)  TempSrc:      SpO2: 98% 99% 96% 98%  Weight:  79 kg    Height:        Body mass index is 25.71 kg/m.  General exam: Pleasant, elderly African-American male.  Not in distress Skin: No rashes, lesions or ulcers. HEENT:  Atraumatic, normocephalic, no obvious bleeding Lungs: Clear to auscultation bilaterally. CVS: Rate controlled A. fib, no murmur GI/Abd soft, nontender, nondistended, bowel sound present CNS: Alert, awake, oriented x3 Psychiatry: Mood appropriate Extremities: Pedal edema trace to 1+ bilaterally, no calf tenderness  Time coordinating discharge: 35 minutes   The results of significant diagnostics from this hospitalization (including imaging, microbiology, ancillary and laboratory) are listed below for reference.    Procedures and Diagnostic Studies:   ECHOCARDIOGRAM COMPLETE  Result Date: 08/24/2021    ECHOCARDIOGRAM REPORT   Patient Name:   AUTHER LYERLY Date of Exam: 08/23/2021 Medical Rec #:  [id number]           Height:       69.0 in Accession #:    [id number]          Weight:       175.0 lb Date of Birth:  [birth year]            BSA:          1.952 m Patient Age:    40 years            BP:           101/86 mmHg Patient Gender: M                   HR:           106 bpm. Exam Location:  [facility] Procedure: 2D Echo and Intracardiac Opacification Agent Indications:     Systolic CHF  History:         Patient has no prior history of Echocardiogram examinations.                  CAD and Previous Myocardial  Infarction, Defibrillator,                  Arrythmias:Atrial Fibrillation; Risk Factors:Hypertension and                  Former Smoker.  Sonographer:     L Thornton-Maynard Referring Phys:  [id number] MATTHEW M ECKSTAT Diagnosing Phys: Yolonda Kida MD IMPRESSIONS  1. Left ventricular ejection fraction, by estimation, is <20%. The left ventricle has severely decreased function. The left ventricle demonstrates global hypokinesis. The left ventricular internal cavity size was severely dilated. Left ventricular diastolic parameters are consistent with Grade III diastolic dysfunction (restrictive). Elevated left ventricular end-diastolic pressure.  2. Right ventricular systolic function is severely  reduced. The right ventricular size is moderately enlarged. There is moderately elevated pulmonary artery systolic pressure.  3. Left atrial size was mild to moderately dilated.  4. Right atrial size was mild to moderately dilated.  5. The mitral valve is grossly normal. Moderate mitral valve regurgitation.  6. Tricuspid valve regurgitation is moderate to severe.  7. The aortic valve is grossly normal. Aortic valve regurgitation is not visualized. FINDINGS  Left Ventricle: Left ventricular ejection fraction, by estimation, is <20%. The left ventricle has severely decreased function. The left ventricle demonstrates global hypokinesis. Definity contrast agent was given IV to delineate the left ventricular endocardial borders. The left ventricular internal cavity size was severely dilated. There is no left ventricular hypertrophy. Left ventricular diastolic parameters are consistent with Grade III diastolic dysfunction (restrictive). Elevated left ventricular end-diastolic pressure. Right Ventricle: The right ventricular size is moderately enlarged. No increase in right ventricular wall thickness. Right ventricular systolic function is severely reduced. There is moderately elevated pulmonary artery systolic pressure. The tricuspid regurgitant velocity is 3.18 m/s, and with an assumed right atrial pressure of 15 mmHg, the estimated right ventricular systolic pressure is 60.7 mmHg. Left Atrium: Left atrial size was mild to moderately dilated. Right Atrium: Right atrial size was mild to moderately dilated. Pericardium: Trivial pericardial effusion is present. Mitral Valve: The mitral valve is grossly normal. The E-point septal separation is normal. Moderate mitral valve regurgitation. MV peak gradient, 3.1 mmHg. The mean mitral valve gradient is 2.0 mmHg. Tricuspid Valve: The tricuspid valve is grossly normal. Tricuspid valve regurgitation is moderate to severe. Aortic Valve: The aortic valve is grossly normal. Aortic  valve regurgitation is not visualized. Aortic valve peak gradient measures 3.5 mmHg. Pulmonic Valve: The pulmonic valve was normal in structure. Pulmonic valve regurgitation is trivial. Aorta: The ascending aorta was not well visualized. IAS/Shunts: No atrial level shunt detected by color flow Doppler. Additional Comments: A device lead is visualized. There is a small pleural effusion in the left lateral region.  LEFT VENTRICLE PLAX 2D LVIDd:         6.01 cm      Diastology LVIDs:         5.85 cm      LV e' medial:    2.25 cm/s LV PW:         0.79 cm      LV E/e' medial:  39.0 LV IVS:        0.96 cm      LV e' lateral:   5.40 cm/s LVOT diam:     2.30 cm      LV E/e' lateral: 16.3 LV SV:         27 LV SV Index:   14 LVOT Area:     4.15 cm  LV  Volumes (MOD) LV vol d, MOD A2C: 256.0 ml LV vol d, MOD A4C: 132.5 ml LV vol s, MOD A2C: 163.0 ml LV vol s, MOD A4C: 121.0 ml LV SV MOD A2C:     93.0 ml LV SV MOD A4C:     132.5 ml LV SV MOD BP:      40.3 ml RIGHT VENTRICLE RV S prime:     4.33 cm/s TAPSE (M-mode): 0.9 cm LEFT ATRIUM             Index        RIGHT ATRIUM           Index LA diam:        4.50 cm 2.31 cm/m   RA Area:     24.00 cm LA Vol (A2C):   78.3 ml 40.11 ml/m  RA Volume:   77.90 ml  39.91 ml/m LA Vol (A4C):   88.8 ml 45.49 ml/m LA Biplane Vol: 89.2 ml 45.70 ml/m  AORTIC VALVE                 PULMONIC VALVE AV Area (Vmax): 1.96 cm     PV Vmax:          0.43 m/s AV Vmax:        93.00 cm/s   PV Peak grad:     0.7 mmHg AV Peak Grad:   3.5 mmHg     PR End Diast Vel: 5.81 msec LVOT Vmax:      43.85 cm/s LVOT Vmean:     28.100 cm/s LVOT VTI:       0.066 m  AORTA Ao Root diam: 3.90 cm Ao Asc diam:  4.00 cm MITRAL VALVE               TRICUSPID VALVE MV Area (PHT): 7.16 cm    TR Peak grad:   40.4 mmHg MV Area VTI:   1.47 cm    TR Vmax:        318.00 cm/s MV Peak grad:  3.1 mmHg MV Mean grad:  2.0 mmHg    SHUNTS MV Vmax:       0.88 m/s    Systemic VTI:  0.07 m MV Vmean:      66.3 cm/s   Systemic Diam: 2.30 cm  MV Decel Time: 106 msec MV E velocity: 87.80 cm/s Yolonda Kida MD Electronically signed by Yolonda Kida MD Signature Date/Time: 08/24/2021/9:33:53 AM    Final      Labs:   Basic Metabolic Panel: Recent Labs  Lab 08/22/21 0510 08/22/21 1209 08/23/21 0455 08/24/21 0605 08/25/21 0500  NA 136  --  139 135 137  K 3.8  --  4.7 4.7 5.7*  CL 105  --  106 103 105  CO2 26  --  25 24 21*  GLUCOSE 110*  --  96 93 92  BUN 28*  --  27* 28* 37*  CREATININE 1.52*  --  1.37* 1.65* 1.89*  CALCIUM 8.8*  --  8.9 8.9 9.1  MG  --  2.1 2.1  --   --    GFR Estimated Creatinine Clearance: 36.9 mL/min (A) (by C-G formula based on SCr of 1.89 mg/dL (H)). Liver Function Tests: No results for input(s): AST, ALT, ALKPHOS, BILITOT, PROT, ALBUMIN in the last 168 hours. No results for input(s): LIPASE, AMYLASE in the last 168 hours. No results for input(s): AMMONIA in the last 168 hours. Coagulation profile No results for input(s): INR, PROTIME  in the last 168 hours.  CBC: Recent Labs  Lab 08/22/21 0510 08/23/21 0455 08/24/21 0605 08/25/21 0500  WBC 6.9 6.9 7.5 6.2  NEUTROABS  --   --  2.3 2.0  HGB 15.0 14.5 13.7 14.3  HCT 46.0 44.0 41.3 42.6  MCV 93.1 92.6 92.2 91.4  PLT 351 347 328 351   Cardiac Enzymes: No results for input(s): CKTOTAL, CKMB, CKMBINDEX, TROPONINI in the last 168 hours. BNP: Invalid input(s): POCBNP CBG: No results for input(s): GLUCAP in the last 168 hours. D-Dimer No results for input(s): DDIMER in the last 72 hours. Hgb A1c Recent Labs    08/23/21 0455  HGBA1C 6.1*   Lipid Profile Recent Labs    08/24/21 0605  CHOL 101  HDL 28*  LDLCALC 64  TRIG 44  CHOLHDL 3.6   Thyroid function studies Recent Labs    08/23/21 0455  TSH 3.653   Anemia work up No results for input(s): VITAMINB12, FOLATE, FERRITIN, TIBC, IRON, RETICCTPCT in the last 72 hours. Microbiology Recent Results (from the past 240 hour(s))  Resp Panel by RT-PCR (Flu A&B, Covid)  Nasopharyngeal Swab     Status: None   Collection Time: 08/22/21 12:09 PM   Specimen: Nasopharyngeal Swab; Nasopharyngeal(NP) swabs in vial transport medium  Result Value Ref Range Status   SARS Coronavirus 2 by RT PCR NEGATIVE NEGATIVE Final    Comment: (NOTE) SARS-CoV-2 target nucleic acids are NOT DETECTED.  The SARS-CoV-2 RNA is generally detectable in upper respiratory specimens during the acute phase of infection. The lowest concentration of SARS-CoV-2 viral copies this assay can detect is 138 copies/mL. A negative result does not preclude SARS-Cov-2 infection and should not be used as the sole basis for treatment or other patient management decisions. A negative result may occur with  improper specimen collection/handling, submission of specimen other than nasopharyngeal swab, presence of viral mutation(s) within the areas targeted by this assay, and inadequate number of viral copies(<138 copies/mL). A negative result must be combined with clinical observations, patient history, and epidemiological information. The expected result is Negative.  Fact Sheet for Patients:  EntrepreneurPulse.com.au  Fact Sheet for Healthcare Providers:  IncredibleEmployment.be  This test is no t yet approved or cleared by the [place] FDA and  has been authorized for detection and/or diagnosis of SARS-CoV-2 by FDA under an Emergency Use Authorization (EUA). This EUA will remain  in effect (meaning this test can be used) for the duration of the COVID-19 declaration under Section 564(b)(1) of the Act, 21 U.S.C.section 360bbb-3(b)(1), unless the authorization is terminated  or revoked sooner.       Influenza A by PCR NEGATIVE NEGATIVE Final   Influenza B by PCR NEGATIVE NEGATIVE Final    Comment: (NOTE) The Xpert Xpress SARS-CoV-2/FLU/RSV plus assay is intended as an aid in the diagnosis of influenza from Nasopharyngeal swab specimens and should not be  used as a sole basis for treatment. Nasal washings and aspirates are unacceptable for Xpert Xpress SARS-CoV-2/FLU/RSV testing.  Fact Sheet for Patients: EntrepreneurPulse.com.au  Fact Sheet for Healthcare Providers: IncredibleEmployment.be  This test is not yet approved or cleared by the [place] FDA and has been authorized for detection and/or diagnosis of SARS-CoV-2 by FDA under an Emergency Use Authorization (EUA). This EUA will remain in effect (meaning this test can be used) for the duration of the COVID-19 declaration under Section 564(b)(1) of the Act, 21 U.S.C. section 360bbb-3(b)(1), unless the authorization is terminated or revoked.  Performed at [facility],  [place], [place] [address]      Signed: Terrilee Croak  [facility] 08/25/2021, 1:03 PM     Physical Exam:   Data Review: I have personally reviewed the laboratory data and studies available.  F/u labs ordered Unresulted Labs (From admission, onward)     Start     Ordered   08/24/21 0500  CBC with Differential/Platelet  Daily,   R      08/23/21 1216   08/24/21 8375  Basic metabolic panel  Daily,   R      08/23/21 1216            Signed, Terrilee Croak, MD [facility] 08/25/2021

## 2021-08-25 NOTE — Plan of Care (Signed)

## 2021-08-25 NOTE — Consult Note (Addendum)
   Heart Failure Nurse Navigator Note  HFrEF less than 20%.  Grade 3 diastolic dysfunction.  Severely decreased right ventricular function.  Mitral regurgitation.  Moderate to severe tricuspid regurgitation.  Mild biatrial enlargement.   He presented to the emergency room with complaints of worsening shortness of breath and leg swelling.  Comorbidities:  Coronary artery disease Hypertension Hyperlipidemia   ICD placement   Medications:  Amiodarone 200 mg 2 times a day Eliquis 5 mg 2 times a day Furosemide 20 mg daily as needed by mouth Metoprolol tartrate 12 and half milligrams 2 times a day Midodrine 2.5 mg 3 times a day with meals [place] 8.4 g x 1 Flomax 0.4 mg daily  Entresto and spironolactone currently on hold.  Labs:  Sodium 137, potassium 5.7, 4.7 yesterday, chloride 105, CO2 21, BUN 37 up from 28 of yesterday, creatinine 1.89 up from 1.65 of yesterday Weight is 79 kg Blood pressure 103/76  Initial meeting with patient today.  States this is very familiar with the term heart failure and works that it very hard to try to stay out of the emergency room.  At home he weighs himself daily, went over parameters of when to be contacting his care provider.  With his diet he states in the last 5 to 8 years he has been eating vegetarian, does not use salt at the table, they rarely eat out at restaurants and if they do he tries to get  something like hummus and vegetables.  Discussed fluid restriction.  He voices concerns over twice a day dosing of medications, he is afraid of missing the evening doses of his medicines.  He does use a pillbox, discussed setting alarms on his smart phone to remind him to take his medications later in the day.  He states at home that he works out in his garden and if he notices that he is getting tired or feeling he is having palpitations that he will sit and rest.  Also talked about his follow-up in the outpatient heart failure clinic, it  is scheduled for December 9 at 1:30 in the afternoon.  He has an 11% no-show ratio, 1 out of 9 appointments  Was given the living with heart failure teaching booklet along with his own magnet and information on low-sodium.  Pricilla Riffle RN [facility]

## 2021-08-28 NOTE — Progress Notes (Signed)
Patient ID: Isaac [name], male    DOB: 16-Apr-1951, 70 y.o.   MRN: [id number]  HPI  Isaac [name] is a 70 y/o male with a history of atrial fibrillation, CAD, HTN, CKD, hyperlipidemia and chronic heart failure.   Echo report from 08/23/21 reviewed and showed an EF of <20% along with severe LVH, moderately elevated PA pressure, moderate Isaac and moderate/severe TR.   Admitted 08/22/21 due to worsening shortness of breath and pedal edema due to acute on chronic heart failure. Initially given IV lasix with resultant hypotension which was managed with IVF and med adjustments. Cardiology consult obtained. Placed on midodrine. Hyperkalemia corrected with one dose of lokelma. Discharged after 3 days.   Isaac [name] presents today for Isaac [name] initial visit with a chief complaint of moderate fatigue with little exertion. Isaac [name] says that this has been going on for several months. Isaac [name] has associated decreased appetite, cough, shortness of breath, pedal edema, abdominal distention, anxiety and difficulty sleeping along with this. Isaac [name] denies any dizziness, chest pain, palpitations or weight gain.   Isaac [name] says that Isaac [name]'s stopped most of Isaac [name] medications because Isaac [name] home BP was getting into the low 80's at home and Isaac [name] felt so bad. Said that most of Isaac [name] meds were changed and Isaac [name] didn't know really what Isaac [name] was taking. Isaac [name]'s been taking midodrine BID and furosemide 20mg  daily and that's it.   Admits to not drinking much fluids because Isaac [name] legs are swollen and Isaac [name] was concerned about drinking fluids.   Occasionally wears compression socks and is not elevating Isaac [name] legs much during the day  Past Medical History:  Diagnosis Date   Arrhythmia    atrial fibrillation   CHF (congestive heart failure) ([place])    Chronic kidney disease    Coronary artery disease    Hyperlipidemia    Hypertension    Myocardial infarct [facility])    Past Surgical History:  Procedure Laterality Date   CARDIAC DEFIBRILLATOR PLACEMENT  feb 2014   History reviewed. No  pertinent family history. Social History   Tobacco Use   Smoking status: Former   Smokeless tobacco: Never  Substance Use Topics   Alcohol use: No   Allergies  Allergen Reactions   Mushroom Extract Complex Nausea And Vomiting   Neosporin [Neomycin-Bacitracin Zn-Polymyx] Hives   Prior to Admission medications   Medication Sig Start Date End Date Taking? Authorizing Provider  albuterol (VENTOLIN HFA) 108 (90 Base) MCG/ACT inhaler Inhale 2 puffs into the lungs every 6 (six) hours as needed. 05/01/21  Yes [provider]  furosemide (LASIX) 20 MG tablet Take 1 tablet (20 mg total) by mouth daily as needed for edema or fluid (sob). Patient taking differently: Take 20 mg by mouth daily. 08/24/21 11/22/21 Yes Dahal, Marlowe Aschoff, MD  midodrine (PROAMATINE) 2.5 MG tablet Take 1 tablet (2.5 mg total) by mouth 3 (three) times daily with meals. 08/24/21 11/22/21 Yes Dahal, Marlowe Aschoff, MD  amiodarone (PACERONE) 200 MG tablet Take 1 tablet (200 mg total) by mouth 2 (two) times daily. Patient not taking: Reported on 08/29/2021 08/24/21 11/22/21  Terrilee Croak, MD  apixaban (ELIQUIS) 5 MG TABS tablet Take 1 tablet (5 mg total) by mouth 2 (two) times daily. Patient not taking: Reported on 08/29/2021 08/24/21 11/22/21  Terrilee Croak, MD  fluticasone (FLONASE) 50 MCG/ACT nasal spray Place 2 sprays into both nostrils daily. Patient not taking: Reported on 08/29/2021 10/25/18   [provider]  metoprolol tartrate (LOPRESSOR) 25 MG tablet Take 0.5 tablets (12.5 mg total)  by mouth 2 (two) times daily. Patient not taking: Reported on 08/29/2021 08/24/21 11/22/21  Terrilee Croak, MD  montelukast (SINGULAIR) 10 MG tablet Take by mouth. 10/23/19 10/22/20  [provider]  spironolactone (ALDACTONE) 25 MG tablet Take 0.5 tablets (12.5 mg total) by mouth daily. Patient not taking: Reported on 08/29/2021 08/24/21 11/22/21  Terrilee Croak, MD  tamsulosin (FLOMAX) 0.4 MG CAPS capsule TAKE 1 CAPSULE (0.4 MG TOTAL) BY MOUTH DAILY  AFTER BREAKFAST. Patient not taking: Reported on 08/29/2021 04/24/20   Zara Council A, PA-C   Review of Systems  Constitutional:  Positive for appetite change (decreased) and fatigue (easily).  HENT:  Negative for congestion, postnasal drip and sore throat.   Eyes: Negative.   Respiratory:  Positive for cough (productive) and shortness of breath (easily). Negative for chest tightness.   Cardiovascular:  Positive for leg swelling. Negative for chest pain and palpitations.  Gastrointestinal:  Positive for abdominal distention. Negative for abdominal pain.  Endocrine: Negative.   Genitourinary: Negative.   Skin: Negative.   Allergic/Immunologic: Negative.   Neurological:  Negative for dizziness and light-headedness.  Hematological:  Negative for adenopathy. Does not bruise/bleed easily.  Psychiatric/Behavioral:  Positive for sleep disturbance (sleeping on 2 pillows or more at times). Negative for dysphoric mood. The patient is nervous/anxious.    Vitals:   08/29/21 1327  BP: 104/87  Pulse: 99  Resp: 18  SpO2: 100%  Weight: 180 lb (81.6 kg)  Height: 5\' 9"  (1.753 m)   Wt Readings from Last 3 Encounters:  08/29/21 180 lb (81.6 kg)  08/25/21 174 lb 1.6 oz (79 kg)  04/18/20 168 lb (76.2 kg)   Lab Results  Component Value Date   CREATININE 1.89 (H) 08/25/2021   CREATININE 1.65 (H) 08/24/2021   CREATININE 1.37 (H) 08/23/2021   Physical Exam Vitals and nursing note reviewed. Exam conducted with a chaperone present (wife).  Constitutional:      Appearance: Normal appearance.  HENT:     Head: Normocephalic and atraumatic.  Cardiovascular:     Rate and Rhythm: Normal rate. Rhythm irregular.  Pulmonary:     Effort: Pulmonary effort is normal. No respiratory distress.     Breath sounds: No wheezing or rales.  Abdominal:     General: There is no distension.     Palpations: Abdomen is soft.  Musculoskeletal:        General: No tenderness.     Cervical back: Normal range of motion  and neck supple.     Right lower leg: Edema (2+ pitting) present.     Left lower leg: Edema (2+ pitting) present.  Skin:    General: Skin is warm and dry.  Neurological:     General: No focal deficit present.     Mental Status: Isaac [name] is alert and oriented to person, place, and time.  Psychiatric:        Mood and Affect: Mood is anxious.        Behavior: Behavior normal.        Thought Content: Thought content normal.   Assessment & Plan:  1: Chronic heart failure with reduced ejection fraction-  - NYHA class III - minimally fluid overload with pedal edema - weighing daily; reminded to call for an overnight weight gain of > 2 pounds or a weekly weight gain of > 5 pounds - not adding salt; reviewed the importance of reading food labels for sodium content; low sodium cookbook provided - currently not on GDMT due to BP -  not drinking much fluids; explained that Isaac [name] needed to drink ~ 60 ounces of fluid daily so that Isaac [name] diuretic can work properly - BNP 08/22/21 was 1593.4  2: HTN- - BP looks good (104/87) - Isaac [name] has stopped almost everything on Isaac [name] own due to hiss low BP at home; has only been taking midodrine BID and Isaac [name] was advised that Isaac [name] needed to take it TID - saw PCP Kary Kos) 08/27/21 - BMP 08/25/21 reviewed and showed sodium 137, potassium 5.7, creatinine 1.89 and GFR 38 - sees cardiology in a few days so have messaged them to recheck labs at that visit  3: Atrial fibrillation- - saw cardiology Petra Kuba) 07/09/21; returns 09/01/21 - has stopped Isaac [name] amiodarone and apixaban on Isaac [name] own as Isaac [name] didn't know if they were contributing to Isaac [name] low BP - instructed to resume Isaac [name] apixaban so Isaac [name] doesn't increase Isaac [name] risk of clots; cardiology will need to address amiodarone use  4: Lymphedema- - stage 2 - not elevating Isaac [name] legs consistently; instructed to elevate them when Isaac [name]'s not walking or eating - wears compression socks occasionally; encouraged to put them on every morning with removal at  bedtime - limited in Isaac [name] ability to exercise due to Isaac [name] symptoms - consider compression boots if edema persists as BP is going to limit titration of diuretics   Medication list reviewed.   Return in 3 weeks or sooner for any questions/problems before then.

## 2021-08-29 ENCOUNTER — Other Ambulatory Visit: Payer: Self-pay

## 2021-08-29 ENCOUNTER — Ambulatory Visit: Payer: Medicare HMO | Attending: Family | Admitting: Family

## 2021-08-29 ENCOUNTER — Encounter: Payer: Self-pay | Admitting: Family

## 2021-08-29 VITALS — BP 104/87 | HR 99 | Resp 18 | Ht 69.0 in | Wt 180.0 lb

## 2021-08-29 DIAGNOSIS — I480 Paroxysmal atrial fibrillation: Secondary | ICD-10-CM

## 2021-08-29 DIAGNOSIS — I4891 Unspecified atrial fibrillation: Secondary | ICD-10-CM | POA: Diagnosis not present

## 2021-08-29 DIAGNOSIS — Z7901 Long term (current) use of anticoagulants: Secondary | ICD-10-CM | POA: Diagnosis not present

## 2021-08-29 DIAGNOSIS — Z79899 Other long term (current) drug therapy: Secondary | ICD-10-CM | POA: Diagnosis not present

## 2021-08-29 DIAGNOSIS — I130 Hypertensive heart and chronic kidney disease with heart failure and stage 1 through stage 4 chronic kidney disease, or unspecified chronic kidney disease: Secondary | ICD-10-CM | POA: Insufficient documentation

## 2021-08-29 DIAGNOSIS — N189 Chronic kidney disease, unspecified: Secondary | ICD-10-CM | POA: Insufficient documentation

## 2021-08-29 DIAGNOSIS — I5022 Chronic systolic (congestive) heart failure: Secondary | ICD-10-CM

## 2021-08-29 DIAGNOSIS — I10 Essential (primary) hypertension: Secondary | ICD-10-CM

## 2021-08-29 DIAGNOSIS — I2510 Atherosclerotic heart disease of native coronary artery without angina pectoris: Secondary | ICD-10-CM | POA: Insufficient documentation

## 2021-08-29 DIAGNOSIS — I890 Lymphedema, not elsewhere classified: Secondary | ICD-10-CM | POA: Insufficient documentation

## 2021-08-29 NOTE — Patient Instructions (Addendum)
Continue weighing daily and call for an overnight weight gain of 3 pounds or more or a weekly weight gain of more than 5 pounds.     Drink around 60 ounces of fluid daily.     Resume blood thinner (eliquis) as 1 tablet twice a day   Take midodrine three times a day   Put compression socks on every morning with removal at bedtime and elevate legs when sitting for long periods of time

## 2021-09-04 ENCOUNTER — Telehealth: Payer: Self-pay | Admitting: Family

## 2021-09-04 ENCOUNTER — Other Ambulatory Visit: Payer: Self-pay | Admitting: Family

## 2021-09-04 ENCOUNTER — Ambulatory Visit
Admission: RE | Admit: 2021-09-04 | Discharge: 2021-09-04 | Disposition: A | Discharge status: Home / Self Care | Payer: Medicare HMO | Source: Ambulatory Visit | Attending: Family | Admitting: Family

## 2021-09-04 DIAGNOSIS — I5023 Acute on chronic systolic (congestive) heart failure: Secondary | ICD-10-CM | POA: Insufficient documentation

## 2021-09-04 LAB — BASIC METABOLIC PANEL
Anion gap: 10 (ref 5–15)
BUN: 46 mg/dL — ABNORMAL HIGH (ref 8–23)
CO2: 22 mmol/L (ref 22–32)
Calcium: 9.4 mg/dL (ref 8.9–10.3)
Chloride: 103 mmol/L (ref 98–111)
Creatinine, Ser: 2.32 mg/dL — ABNORMAL HIGH (ref 0.61–1.24)
GFR, Estimated: 30 mL/min — ABNORMAL LOW (ref >60)
Glucose, Bld: 91 mg/dL (ref 70–99)
Potassium: 4.3 mmol/L (ref 3.5–5.1)
Sodium: 135 mmol/L (ref 135–145)

## 2021-09-04 LAB — BRAIN NATRIURETIC PEPTIDE: B Natriuretic Peptide: 1644.2 pg/mL — ABNORMAL HIGH (ref 0.0–100.0)

## 2021-09-04 MED ORDER — FUROSEMIDE 10 MG/ML IJ SOLN: 80.0000 mg | Freq: Once | INTRAMUSCULAR | Status: AC

## 2021-09-04 MED ORDER — FUROSEMIDE 10 MG/ML IJ SOLN
INTRAMUSCULAR | Status: AC
  Administered 2021-09-04: 80 mg via INTRAVENOUS
  Filled 2021-09-04: qty 8

## 2021-09-04 NOTE — Telephone Encounter (Signed)
Patient's wife called saying that his shortness of breath is worsening where he can't lay down at night. He has a decreased appetite, increased swelling in his legs and slight weight gain. She says that it doesn't appear that the fluid pill is working very well.   Orders placed for IV lasix to be given today. She will be called back with an appointment time. She was appreciative of this.

## 2021-09-04 NOTE — Progress Notes (Signed)
Orders placed for IV lasix

## 2021-09-04 NOTE — Telephone Encounter (Signed)
Called Wife back after her initially calling requesting IV lasix as patient is in fluid overload. After Otila Kluver discussing symptoms with wife, we put orders in for Lasix and after it was scheduled, reached out to wife with a time frame.   Shiryl Ruddy, NT

## 2021-09-06 ENCOUNTER — Other Ambulatory Visit: Payer: Self-pay

## 2021-09-06 ENCOUNTER — Emergency Department (HOSPITAL_COMMUNITY): Payer: Medicare HMO

## 2021-09-06 ENCOUNTER — Inpatient Hospital Stay (HOSPITAL_COMMUNITY)
Admission: EM | Admit: 2021-09-06 | Discharge: 2021-10-24 | DRG: 001 | Disposition: A | Discharge status: Home Health | Payer: Medicare HMO | Attending: Surgery | Admitting: Surgery

## 2021-09-06 ENCOUNTER — Encounter (HOSPITAL_COMMUNITY): Payer: Self-pay | Admitting: Emergency Medicine

## 2021-09-06 DIAGNOSIS — R7989 Other specified abnormal findings of blood chemistry: Secondary | ICD-10-CM

## 2021-09-06 DIAGNOSIS — Z9861 Coronary angioplasty status: Secondary | ICD-10-CM

## 2021-09-06 DIAGNOSIS — I4819 Other persistent atrial fibrillation: Secondary | ICD-10-CM | POA: Diagnosis present

## 2021-09-06 DIAGNOSIS — D696 Thrombocytopenia, unspecified: Secondary | ICD-10-CM | POA: Diagnosis present

## 2021-09-06 DIAGNOSIS — I3139 Other pericardial effusion (noninflammatory): Secondary | ICD-10-CM | POA: Diagnosis not present

## 2021-09-06 DIAGNOSIS — I5082 Biventricular heart failure: Secondary | ICD-10-CM | POA: Diagnosis present

## 2021-09-06 DIAGNOSIS — R55 Syncope and collapse: Secondary | ICD-10-CM | POA: Diagnosis not present

## 2021-09-06 DIAGNOSIS — D75839 Thrombocytosis, unspecified: Secondary | ICD-10-CM | POA: Diagnosis present

## 2021-09-06 DIAGNOSIS — S5012XA Contusion of left forearm, initial encounter: Secondary | ICD-10-CM | POA: Diagnosis present

## 2021-09-06 DIAGNOSIS — L8990 Pressure ulcer of unspecified site, unspecified stage: Secondary | ICD-10-CM | POA: Insufficient documentation

## 2021-09-06 DIAGNOSIS — Z4659 Encounter for fitting and adjustment of other gastrointestinal appliance and device: Secondary | ICD-10-CM

## 2021-09-06 DIAGNOSIS — Z95811 Presence of heart assist device: Secondary | ICD-10-CM

## 2021-09-06 DIAGNOSIS — Z992 Dependence on renal dialysis: Secondary | ICD-10-CM

## 2021-09-06 DIAGNOSIS — N186 End stage renal disease: Secondary | ICD-10-CM | POA: Diagnosis present

## 2021-09-06 DIAGNOSIS — N390 Urinary tract infection, site not specified: Secondary | ICD-10-CM | POA: Diagnosis present

## 2021-09-06 DIAGNOSIS — I4891 Unspecified atrial fibrillation: Secondary | ICD-10-CM | POA: Diagnosis present

## 2021-09-06 DIAGNOSIS — I132 Hypertensive heart and chronic kidney disease with heart failure and with stage 5 chronic kidney disease, or end stage renal disease: Principal | ICD-10-CM | POA: Diagnosis present

## 2021-09-06 DIAGNOSIS — I5023 Acute on chronic systolic (congestive) heart failure: Secondary | ICD-10-CM | POA: Diagnosis present

## 2021-09-06 DIAGNOSIS — Z79899 Other long term (current) drug therapy: Secondary | ICD-10-CM

## 2021-09-06 DIAGNOSIS — I9581 Postprocedural hypotension: Secondary | ICD-10-CM | POA: Diagnosis not present

## 2021-09-06 DIAGNOSIS — I10 Essential (primary) hypertension: Secondary | ICD-10-CM | POA: Diagnosis not present

## 2021-09-06 DIAGNOSIS — N1832 Chronic kidney disease, stage 3b: Secondary | ICD-10-CM | POA: Diagnosis not present

## 2021-09-06 DIAGNOSIS — J189 Pneumonia, unspecified organism: Secondary | ICD-10-CM

## 2021-09-06 DIAGNOSIS — Z95810 Presence of automatic (implantable) cardiac defibrillator: Secondary | ICD-10-CM

## 2021-09-06 DIAGNOSIS — E782 Mixed hyperlipidemia: Secondary | ICD-10-CM | POA: Diagnosis present

## 2021-09-06 DIAGNOSIS — M79674 Pain in right toe(s): Secondary | ICD-10-CM | POA: Diagnosis present

## 2021-09-06 DIAGNOSIS — N170 Acute kidney failure with tubular necrosis: Secondary | ICD-10-CM | POA: Diagnosis present

## 2021-09-06 DIAGNOSIS — Z7901 Long term (current) use of anticoagulants: Secondary | ICD-10-CM

## 2021-09-06 DIAGNOSIS — J9601 Acute respiratory failure with hypoxia: Secondary | ICD-10-CM | POA: Diagnosis not present

## 2021-09-06 DIAGNOSIS — J939 Pneumothorax, unspecified: Secondary | ICD-10-CM

## 2021-09-06 DIAGNOSIS — R5082 Postprocedural fever: Secondary | ICD-10-CM | POA: Diagnosis not present

## 2021-09-06 DIAGNOSIS — K5900 Constipation, unspecified: Secondary | ICD-10-CM | POA: Diagnosis present

## 2021-09-06 DIAGNOSIS — M7989 Other specified soft tissue disorders: Secondary | ICD-10-CM | POA: Diagnosis not present

## 2021-09-06 DIAGNOSIS — E876 Hypokalemia: Secondary | ICD-10-CM | POA: Diagnosis not present

## 2021-09-06 DIAGNOSIS — I361 Nonrheumatic tricuspid (valve) insufficiency: Secondary | ICD-10-CM | POA: Diagnosis not present

## 2021-09-06 DIAGNOSIS — D62 Acute posthemorrhagic anemia: Secondary | ICD-10-CM | POA: Diagnosis not present

## 2021-09-06 DIAGNOSIS — I501 Left ventricular failure: Secondary | ICD-10-CM | POA: Diagnosis not present

## 2021-09-06 DIAGNOSIS — J9811 Atelectasis: Secondary | ICD-10-CM | POA: Diagnosis present

## 2021-09-06 DIAGNOSIS — R972 Elevated prostate specific antigen [PSA]: Secondary | ICD-10-CM | POA: Diagnosis present

## 2021-09-06 DIAGNOSIS — I2510 Atherosclerotic heart disease of native coronary artery without angina pectoris: Secondary | ICD-10-CM | POA: Diagnosis present

## 2021-09-06 DIAGNOSIS — I252 Old myocardial infarction: Secondary | ICD-10-CM

## 2021-09-06 DIAGNOSIS — Z20822 Contact with and (suspected) exposure to covid-19: Secondary | ICD-10-CM | POA: Diagnosis present

## 2021-09-06 DIAGNOSIS — E871 Hypo-osmolality and hyponatremia: Secondary | ICD-10-CM | POA: Diagnosis present

## 2021-09-06 DIAGNOSIS — I5043 Acute on chronic combined systolic (congestive) and diastolic (congestive) heart failure: Secondary | ICD-10-CM

## 2021-09-06 DIAGNOSIS — I083 Combined rheumatic disorders of mitral, aortic and tricuspid valves: Secondary | ICD-10-CM | POA: Diagnosis present

## 2021-09-06 DIAGNOSIS — Z452 Encounter for adjustment and management of vascular access device: Secondary | ICD-10-CM

## 2021-09-06 DIAGNOSIS — Z87891 Personal history of nicotine dependence: Secondary | ICD-10-CM

## 2021-09-06 DIAGNOSIS — D631 Anemia in chronic kidney disease: Secondary | ICD-10-CM | POA: Diagnosis present

## 2021-09-06 DIAGNOSIS — Z01810 Encounter for preprocedural cardiovascular examination: Secondary | ICD-10-CM | POA: Diagnosis not present

## 2021-09-06 DIAGNOSIS — I951 Orthostatic hypotension: Secondary | ICD-10-CM | POA: Diagnosis present

## 2021-09-06 DIAGNOSIS — R579 Shock, unspecified: Secondary | ICD-10-CM | POA: Diagnosis not present

## 2021-09-06 DIAGNOSIS — Z66 Do not resuscitate: Secondary | ICD-10-CM | POA: Diagnosis present

## 2021-09-06 DIAGNOSIS — Z515 Encounter for palliative care: Secondary | ICD-10-CM | POA: Diagnosis not present

## 2021-09-06 DIAGNOSIS — I953 Hypotension of hemodialysis: Secondary | ICD-10-CM | POA: Diagnosis not present

## 2021-09-06 DIAGNOSIS — Z8249 Family history of ischemic heart disease and other diseases of the circulatory system: Secondary | ICD-10-CM

## 2021-09-06 DIAGNOSIS — J90 Pleural effusion, not elsewhere classified: Secondary | ICD-10-CM | POA: Diagnosis not present

## 2021-09-06 DIAGNOSIS — I8000 Phlebitis and thrombophlebitis of superficial vessels of unspecified lower extremity: Secondary | ICD-10-CM | POA: Diagnosis not present

## 2021-09-06 DIAGNOSIS — I509 Heart failure, unspecified: Secondary | ICD-10-CM

## 2021-09-06 DIAGNOSIS — Z6822 Body mass index (BMI) 22.0-22.9, adult: Secondary | ICD-10-CM

## 2021-09-06 DIAGNOSIS — R570 Cardiogenic shock: Secondary | ICD-10-CM | POA: Diagnosis not present

## 2021-09-06 DIAGNOSIS — E875 Hyperkalemia: Secondary | ICD-10-CM | POA: Diagnosis not present

## 2021-09-06 DIAGNOSIS — R34 Anuria and oliguria: Secondary | ICD-10-CM | POA: Diagnosis not present

## 2021-09-06 DIAGNOSIS — X58XXXA Exposure to other specified factors, initial encounter: Secondary | ICD-10-CM | POA: Diagnosis present

## 2021-09-06 DIAGNOSIS — E43 Unspecified severe protein-calorie malnutrition: Secondary | ICD-10-CM | POA: Diagnosis not present

## 2021-09-06 DIAGNOSIS — I472 Ventricular tachycardia, unspecified: Secondary | ICD-10-CM | POA: Diagnosis present

## 2021-09-06 DIAGNOSIS — I5021 Acute systolic (congestive) heart failure: Secondary | ICD-10-CM | POA: Diagnosis not present

## 2021-09-06 DIAGNOSIS — R509 Fever, unspecified: Secondary | ICD-10-CM

## 2021-09-06 DIAGNOSIS — Z9889 Other specified postprocedural states: Secondary | ICD-10-CM

## 2021-09-06 DIAGNOSIS — Z7189 Other specified counseling: Secondary | ICD-10-CM | POA: Diagnosis not present

## 2021-09-06 DIAGNOSIS — N179 Acute kidney failure, unspecified: Secondary | ICD-10-CM | POA: Diagnosis not present

## 2021-09-06 DIAGNOSIS — I5022 Chronic systolic (congestive) heart failure: Secondary | ICD-10-CM | POA: Diagnosis not present

## 2021-09-06 DIAGNOSIS — I255 Ischemic cardiomyopathy: Secondary | ICD-10-CM | POA: Diagnosis present

## 2021-09-06 DIAGNOSIS — N183 Chronic kidney disease, stage 3 unspecified: Secondary | ICD-10-CM | POA: Diagnosis not present

## 2021-09-06 DIAGNOSIS — Z01818 Encounter for other preprocedural examination: Secondary | ICD-10-CM

## 2021-09-06 LAB — CBC WITH DIFFERENTIAL/PLATELET
Abs Immature Granulocytes: 0.01 10*3/uL (ref 0.00–0.07)
Basophils Absolute: 0.1 10*3/uL (ref 0.0–0.1)
Basophils Relative: 1 %
Eosinophils Absolute: 3.5 10*3/uL — ABNORMAL HIGH (ref 0.0–0.5)
Eosinophils Relative: 51 %
HCT: 47.5 % (ref 39.0–52.0)
Hemoglobin: 15.4 g/dL (ref 13.0–17.0)
Immature Granulocytes: 0 %
Lymphocytes Relative: 17 %
Lymphs Abs: 1.2 10*3/uL (ref 0.7–4.0)
MCH: 30.5 pg (ref 26.0–34.0)
MCHC: 32.4 g/dL (ref 30.0–36.0)
MCV: 94.1 fL (ref 80.0–100.0)
Monocytes Absolute: 0.5 10*3/uL (ref 0.1–1.0)
Monocytes Relative: 8 %
Neutro Abs: 1.5 10*3/uL — ABNORMAL LOW (ref 1.7–7.7)
Neutrophils Relative %: 23 %
Platelets: 330 10*3/uL (ref 150–400)
RBC: 5.05 MIL/uL (ref 4.22–5.81)
RDW: 17.2 % — ABNORMAL HIGH (ref 11.5–15.5)
WBC: 6.7 10*3/uL (ref 4.0–10.5)
nRBC: 0 % (ref 0.0–0.2)

## 2021-09-06 LAB — RESP PANEL BY RT-PCR (FLU A&B, COVID) ARPGX2
Influenza A by PCR: NEGATIVE
Influenza B by PCR: NEGATIVE
SARS Coronavirus 2 by RT PCR: NEGATIVE

## 2021-09-06 LAB — BRAIN NATRIURETIC PEPTIDE: B Natriuretic Peptide: 1614.8 pg/mL — ABNORMAL HIGH (ref 0.0–100.0)

## 2021-09-06 LAB — COMPREHENSIVE METABOLIC PANEL
ALT: 32 U/L (ref 0–44)
AST: 34 U/L (ref 15–41)
Albumin: 3.4 g/dL — ABNORMAL LOW (ref 3.5–5.0)
Alkaline Phosphatase: 114 U/L (ref 38–126)
Anion gap: 8 (ref 5–15)
BUN: 40 mg/dL — ABNORMAL HIGH (ref 8–23)
CO2: 25 mmol/L (ref 22–32)
Calcium: 9.2 mg/dL (ref 8.9–10.3)
Chloride: 103 mmol/L (ref 98–111)
Creatinine, Ser: 1.95 mg/dL — ABNORMAL HIGH (ref 0.61–1.24)
GFR, Estimated: 37 mL/min — ABNORMAL LOW (ref >60)
Glucose, Bld: 94 mg/dL (ref 70–99)
Potassium: 4.1 mmol/L (ref 3.5–5.1)
Sodium: 136 mmol/L (ref 135–145)
Total Bilirubin: 2.8 mg/dL — ABNORMAL HIGH (ref 0.3–1.2)
Total Protein: 7.1 g/dL (ref 6.5–8.1)

## 2021-09-06 LAB — PHOSPHORUS: Phosphorus: 3.8 mg/dL (ref 2.5–4.6)

## 2021-09-06 LAB — T4, FREE: Free T4: 1.81 ng/dL — ABNORMAL HIGH (ref 0.61–1.12)

## 2021-09-06 LAB — APTT: aPTT: 38 seconds — ABNORMAL HIGH (ref 24–36)

## 2021-09-06 LAB — TROPONIN I (HIGH SENSITIVITY): Troponin I (High Sensitivity): 27 ng/L — ABNORMAL HIGH (ref <18)

## 2021-09-06 LAB — MAGNESIUM: Magnesium: 2.2 mg/dL (ref 1.7–2.4)

## 2021-09-06 LAB — HIV ANTIBODY (ROUTINE TESTING W REFLEX): HIV Screen 4th Generation wRfx: NONREACTIVE

## 2021-09-06 LAB — HEPARIN LEVEL (UNFRACTIONATED): Heparin Unfractionated: >1.1 IU/mL — ABNORMAL HIGH (ref 0.30–0.70)

## 2021-09-06 LAB — TSH: TSH: 8.683 u[IU]/mL — ABNORMAL HIGH (ref 0.350–4.500)

## 2021-09-06 LAB — PROTIME-INR
INR: 2 — ABNORMAL HIGH (ref 0.8–1.2)
Prothrombin Time: 22.5 seconds — ABNORMAL HIGH (ref 11.4–15.2)

## 2021-09-06 MED ORDER — FUROSEMIDE 10 MG/ML IJ SOLN
40.0000 mg | Freq: Two times a day (BID) | INTRAMUSCULAR | Status: DC
  Administered 2021-09-06: 40 mg via INTRAVENOUS
  Filled 2021-09-06: qty 4

## 2021-09-06 MED ORDER — ONDANSETRON HCL 4 MG/2ML IJ SOLN
4.0000 mg | Freq: Four times a day (QID) | INTRAMUSCULAR | Status: DC | PRN
  Administered 2021-09-07 – 2021-09-10 (×5): 4 mg via INTRAVENOUS
  Filled 2021-09-06 (×5): qty 2

## 2021-09-06 MED ORDER — SODIUM CHLORIDE 0.9 % IV SOLN
250.0000 mL | INTRAVENOUS | Status: DC | PRN
  Administered 2021-09-08 – 2021-09-10 (×3): 250 mL via INTRAVENOUS

## 2021-09-06 MED ORDER — ACETAMINOPHEN 325 MG PO TABS
650.0000 mg | ORAL_TABLET | ORAL | Status: DC | PRN
  Administered 2021-09-09 – 2021-09-16 (×9): 650 mg via ORAL
  Filled 2021-09-06 (×9): qty 2

## 2021-09-06 MED ORDER — ALBUTEROL SULFATE (2.5 MG/3ML) 0.083% IN NEBU: 3.0000 mL | INHALATION_SOLUTION | Freq: Four times a day (QID) | RESPIRATORY_TRACT | Status: DC | PRN

## 2021-09-06 MED ORDER — MIDODRINE HCL 5 MG PO TABS
2.5000 mg | ORAL_TABLET | Freq: Three times a day (TID) | ORAL | Status: DC
  Administered 2021-09-06 – 2021-09-09 (×8): 2.5 mg via ORAL
  Filled 2021-09-06 (×7): qty 1

## 2021-09-06 MED ORDER — MONTELUKAST SODIUM 10 MG PO TABS
5.0000 mg | ORAL_TABLET | Freq: Every day | ORAL | Status: DC
  Administered 2021-09-07 – 2021-09-16 (×9): 5 mg via ORAL
  Filled 2021-09-06 (×3): qty 1
  Filled 2021-09-06: qty 0.5
  Filled 2021-09-06 (×7): qty 1

## 2021-09-06 MED ORDER — HEPARIN (PORCINE) 25000 UT/250ML-% IV SOLN
1200.0000 [IU]/h | INTRAVENOUS | Status: DC
  Administered 2021-09-06: 1200 [IU]/h via INTRAVENOUS
  Filled 2021-09-06: qty 250

## 2021-09-06 MED ORDER — SODIUM CHLORIDE 0.9% FLUSH
3.0000 mL | Freq: Two times a day (BID) | INTRAVENOUS | Status: DC
  Administered 2021-09-06 – 2021-09-15 (×5): 3 mL via INTRAVENOUS

## 2021-09-06 MED ORDER — APIXABAN 5 MG PO TABS: 5.0000 mg | ORAL_TABLET | Freq: Two times a day (BID) | ORAL | Status: DC

## 2021-09-06 MED ORDER — SODIUM CHLORIDE 0.9% FLUSH: 3.0000 mL | INTRAVENOUS | Status: DC | PRN

## 2021-09-06 NOTE — ED Provider Notes (Signed)
Emergency Medicine Provider Triage Evaluation Note  Isaac [name] , a 70 y.o. male  was evaluated in triage.  Pt complains of syncope.  Patient states he got up to go to the restroom.  He felt very lightheaded like he was vision was going into a tunnel and felt like he was falling to the ground.  He states he was able to lower himself without causing any injury.  His wife was present.  He states he is unclear how long he was unconscious.  He denies any postictal phase or loss of bladder control.  He denies any chest pain.  He states that he has had some dyspnea.  He reports a past medical history of CHF and EMS reports he has a pacemaker in place.  Prehospital vital signs are stable.  Review of Systems  Positive: Syncope Negative: Chest pain, injury  Physical Exam  There were no vitals taken for this visit. Gen:   Awake, no distress   Resp:  Normal effort  MSK:   Moves extremities without difficulty  Other:  Awake and alert  Medical Decision Making  Medically screening exam initiated at 9:14 AM.  Appropriate orders placed.  Isaac [name] was informed that the remainder of the evaluation will be completed by another provider, this initial triage assessment does not replace that evaluation, and the importance of remaining in the ED until their evaluation is complete.  Plan to place labs, imaging, and EKG. Will need pacemaker interrogation   Isaac Boss, MD 09/06/21 [phone]

## 2021-09-06 NOTE — ED Provider Notes (Signed)
[facility] EMERGENCY DEPARTMENT Provider Note   CSN: [id number] Arrival date & time: 09/06/21  0920     History Chief Complaint  Patient presents with   Shortness of Breath   syncopal episode    Isaac [name] is a 70 y.o. male.  HPI Patient reports he been having problems with shortness of breath for the past month.  He was hospitalized overnight 12\4.  Diagnosis at that time was acute on chronic congestive heart failure.  Patient has several medication changes.  He was subsequently seen at his cardiologist office on 12\12.  He is supposed be on amiodarone and Eliquis.  Patient has had problems with hypotension and also nausea and vomiting with medications.  Today he had a syncopal episode precipitating his return to the hospital.  He got up and got very lightheaded he knew he was going to be going to the ground.  He was able to ease himself down without collapsing or injuring himself.  He then lay on the ground until his wife could help him he denies he is having any chest pain.  He reports he feels short of breath all the time.  He cannot lay flat.  He reports is worse with exertion.  No fevers or body aches.  He reports for weeks now he has been persistently having swelling in the lower legs.  It does not improve with diuresis or elevating.    Past Medical History:  Diagnosis Date   Arrhythmia    atrial fibrillation   CHF (congestive heart failure) ([place])    Chronic kidney disease    Coronary artery disease    Hyperlipidemia    Hypertension    Myocardial infarct [facility])     Patient Active Problem List   Diagnosis Date Noted   CAD (coronary artery disease) 08/22/2021   Cardiomyopathy ([place]) 08/22/2021   Mixed hyperlipidemia 08/22/2021   A-fib ([place]) 08/22/2021   Elevated troponin 08/22/2021   Acute on chronic systolic (congestive) heart failure ([place]) 08/22/2021   Benign essential hypertension 02/22/2015   Acute on chronic systolic CHF (congestive heart  failure), NYHA class 3 ([place]) 07/05/2014    Past Surgical History:  Procedure Laterality Date   CARDIAC DEFIBRILLATOR PLACEMENT  feb 2014       No family history on file.  Social History   Tobacco Use   Smoking status: Former   Smokeless tobacco: Never  Substance Use Topics   Alcohol use: No   Drug use: No    Home Medications Prior to Admission medications   Medication Sig Start Date End Date Taking? Authorizing Provider  albuterol (VENTOLIN HFA) 108 (90 Base) MCG/ACT inhaler Inhale 2 puffs into the lungs every 6 (six) hours as needed. 05/01/21   [provider]  amiodarone (PACERONE) 200 MG tablet Take 1 tablet (200 mg total) by mouth 2 (two) times daily. Patient not taking: Reported on 08/29/2021 08/24/21 11/22/21  Terrilee Croak, MD  apixaban (ELIQUIS) 5 MG TABS tablet Take 1 tablet (5 mg total) by mouth 2 (two) times daily. Patient not taking: Reported on 08/29/2021 08/24/21 11/22/21  Terrilee Croak, MD  fluticasone (FLONASE) 50 MCG/ACT nasal spray Place 2 sprays into both nostrils daily. Patient not taking: Reported on 08/29/2021 10/25/18   [provider]  furosemide (LASIX) 20 MG tablet Take 1 tablet (20 mg total) by mouth daily as needed for edema or fluid (sob). Patient taking differently: Take 20 mg by mouth daily. 08/24/21 11/22/21  Terrilee Croak, MD  metoprolol  tartrate (LOPRESSOR) 25 MG tablet Take 0.5 tablets (12.5 mg total) by mouth 2 (two) times daily. Patient not taking: Reported on 08/29/2021 08/24/21 11/22/21  Terrilee Croak, MD  midodrine (PROAMATINE) 2.5 MG tablet Take 1 tablet (2.5 mg total) by mouth 3 (three) times daily with meals. 08/24/21 11/22/21  Dahal, Marlowe Aschoff, MD  montelukast (SINGULAIR) 10 MG tablet Take by mouth. 10/23/19 10/22/20  [provider]  spironolactone (ALDACTONE) 25 MG tablet Take 0.5 tablets (12.5 mg total) by mouth daily. Patient not taking: Reported on 08/29/2021 08/24/21 11/22/21  Terrilee Croak, MD  tamsulosin (FLOMAX) 0.4 MG CAPS  capsule TAKE 1 CAPSULE (0.4 MG TOTAL) BY MOUTH DAILY AFTER BREAKFAST. Patient not taking: Reported on 08/29/2021 04/24/20   Zara Council A, PA-C    Allergies    Mushroom extract complex and Neosporin [neomycin-bacitracin zn-polymyx]  Review of Systems   Review of Systems 10 systems reviewed and negative except as per HPI Physical Exam Updated Vital Signs BP 105/87    Pulse 89    Temp (!) 97.3 F (36.3 C)    Resp 16    SpO2 99%   Physical Exam Constitutional:      Comments: Alert nontoxic no significant respiratory distress at rest.  HENT:     Head: Normocephalic and atraumatic.     Mouth/Throat:     Pharynx: Oropharynx is clear.  Eyes:     Extraocular Movements: Extraocular movements intact.  Cardiovascular:     Comments: Irregular.  3 out of 6 systolic ejection murmur holosystolic Pulmonary:     Comments: Lungs grossly clear. Abdominal:     General: There is no distension.     Palpations: Abdomen is soft.     Tenderness: There is no abdominal tenderness. There is no guarding.  Musculoskeletal:     Comments: 1-2+ pitting around the lower legs.  Feet are in good condition.  No wounds or significant skin thinning.  Skin:    General: Skin is warm and dry.  Neurological:     Mental Status: He is oriented to person, place, and time. Mental status is at baseline.     Coordination: Coordination normal.  Psychiatric:        Mood and Affect: Mood normal.    ED Results / Procedures / Treatments   Labs (all labs ordered are listed, but only abnormal results are displayed) Labs Reviewed  RESP PANEL BY RT-PCR (FLU A&B, COVID) [id number]  COMPREHENSIVE METABOLIC PANEL  CBC WITH DIFFERENTIAL/PLATELET  MAGNESIUM  PHOSPHORUS  TSH  BRAIN NATRIURETIC PEPTIDE  TROPONIN I (HIGH SENSITIVITY)    EKG EKG Interpretation  Date/Time:  Saturday September 06 2021 09:28:21 EST Ventricular Rate:  86 PR Interval:  152 QRS Duration: 128 QT Interval:  388 QTC Calculation: 464 R  Axis:   17 Text Interpretation: Unusual P axis, possible ectopic atrial rhythm with frequent ventricular-paced complexes Left ventricular hypertrophy with QRS widening and repolarization abnormality ( Cornell product ) Abnormal ECG paced, similar to previous but paced in lateral leads on current tracing Confirmed by Charlesetta Shanks [phone]) on 09/06/2021 12:29:52 PM  Radiology DG Chest 1 View  Result Date: 09/06/2021 CLINICAL DATA:  Syncope EXAM: CHEST  1 VIEW COMPARISON:  Chest x-ray 08/22/2021 FINDINGS: Cardiomegaly unchanged. Mediastinum appears stable. Calcified plaques in the aortic arch. Left-sided cardiac pacemaker device. Pulmonary vasculature appears within normal limits. Stable small left pleural effusion with associated atelectasis/infiltrate. No new consolidation identified. No pneumothorax. IMPRESSION: Cardiomegaly and stable small left pleural effusion with associated atelectasis/infiltrate. Electronically Signed  By: Ofilia Neas M.D.   On: 09/06/2021 10:24    Procedures Procedures   Medications Ordered in ED Medications - No data to display  ED Course  I have reviewed the triage vital signs and the nursing notes.  Pertinent labs & imaging results that were available during my care of the patient were reviewed by me and considered in my medical decision making (see chart for details).  Clinical Course as of 09/08/21 1635  Sat Sep 06, 2021  1529 Consult: Cardiology Dr.Chandrasekhar will see in the emergency department.  Recommends medical admission. [MP]    Clinical Course User Index [MP] Charlesetta Shanks, MD   MDM Rules/Calculators/A&P                         Patient presents as outlined.  He has persistent shortness of breath.  He has history of chronic congestive heart failure.  Patient has been under care of cardiology for management of pacemaker, atrial fibrillation.  Patient has had progressive shortness of breath and now syncope.  No active chest pain.  Patient  takes Eliquis and amiodarone for atrial fibrillation.  Patient is high risk for congestive heart failure and dysrhythmia.  We will plan for admission.    Final Clinical Impression(s) / ED Diagnoses Final diagnoses:  Syncope    Rx / DC Orders ED Discharge Orders     None        Charlesetta Shanks, MD 09/08/21 [phone]

## 2021-09-06 NOTE — ED Notes (Addendum)
Lab called and states they received COVID swab but no order.  Informed them that order is in and tried to modify order and they are still unable to see it.  Re-entered order and they are now unable to see it.

## 2021-09-06 NOTE — Consult Note (Signed)
Cardiology Consult :   Patient ID: Isaac [name] MRN: [id number]; DOB: [birth year]   Admission date: 09/06/2021  PCP:  [name], MD   [facility] Providers Cardiologist:  [facility]  Chief Complaint:  shortness of breath  Patient Profile:   Isaac [name] is a 70 y.o. 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  History of Present Illness:   Isaac [name] notes that he is feeling poorly.  Despite recent [facility] admission and HF follow up, he has discontinued all of his medications except for his eliquis and his midodrine.  Has had no chest pain, chest pressure, chest tightness, chest stinging (of his overall coronary history form review and in discussion with patient.    Patient notes that recently he saw his HF PA and was returned to his four pillar GDMT.  Felt nauseous and weak.   Patient attempted to get up this AM after using the restroom and felt everything go black.  Notes that his SOB and DOE are getting worse.  Notes PND and orthopnea.  No weight gain but notes, leg swelling and abdominal swelling.  No syncope.  Has never been shocked from his device.  Inpatient evaluation because of syncope.  Notes no palpitations or funny heart beats (has history of AF NOS)  In the ED/OSH patient received no medications.  Key labs notable for Cr 1.95 (near baseline), BNP 1600 (also near baseline with recent checks) and troponin << 50.  TSH 8.7 despite not taking his amiodarone. Key imaging includes small left pleural effusion (had pleural effusion on his recent echo showing biventricular failure, MR and TR).  Cardiology called for evaluation.   Past Medical History:  Diagnosis Date   Arrhythmia    atrial fibrillation   CHF (congestive heart failure) (HCC)    Chronic kidney  disease    Coronary artery disease    Hyperlipidemia    Hypertension    Myocardial infarct [facility])     Past Surgical History:  Procedure Laterality Date   CARDIAC DEFIBRILLATOR PLACEMENT  feb 2014     Medications Prior to Admission: Prior to Admission medications   Medication Sig Start Date End Date Taking? Authorizing Provider  albuterol (VENTOLIN HFA) 108 (90 Base) MCG/ACT inhaler Inhale 2 puffs into the lungs every 6 (six) hours as needed. 05/01/21   [provider]  amiodarone (PACERONE) 200 MG tablet Take 1 tablet (200 mg total) by mouth 2 (two) times daily. Patient not taking: Reported on 08/29/2021 08/24/21 11/22/21  Terrilee Croak, MD  apixaban (ELIQUIS) 5 MG TABS tablet Take 1 tablet (5 mg total) by mouth 2 (two) times daily. Patient not taking: Reported on 08/29/2021 08/24/21 11/22/21  Terrilee Croak, MD  fluticasone (FLONASE) 50 MCG/ACT nasal spray Place 2 sprays into both nostrils daily. Patient not taking: Reported on 08/29/2021 10/25/18   [provider]  furosemide (LASIX) 20 MG tablet Take 1 tablet (20 mg total) by mouth daily as needed for edema or fluid (sob). Patient taking differently: Take 20 mg by mouth daily. 08/24/21 11/22/21  Terrilee Croak, MD  metoprolol tartrate (LOPRESSOR) 25 MG tablet Take 0.5 tablets (12.5 mg total) by mouth 2 (two) times daily. Patient not taking: Reported on 08/29/2021 08/24/21 11/22/21  Terrilee Croak, MD  midodrine (PROAMATINE) 2.5 MG tablet Take 1  tablet (2.5 mg total) by mouth 3 (three) times daily with meals. 08/24/21 11/22/21  Dahal, Marlowe Aschoff, MD  montelukast (SINGULAIR) 10 MG tablet Take by mouth. 10/23/19 10/22/20  [provider]  spironolactone (ALDACTONE) 25 MG tablet Take 0.5 tablets (12.5 mg total) by mouth daily. Patient not taking: Reported on 08/29/2021 08/24/21 11/22/21  Terrilee Croak, MD  tamsulosin (FLOMAX) 0.4 MG CAPS capsule TAKE 1 CAPSULE (0.4 MG TOTAL) BY MOUTH DAILY AFTER BREAKFAST. Patient not taking: Reported on 08/29/2021  04/24/20   Zara Council A, PA-C     Allergies:    Allergies  Allergen Reactions   Mushroom Extract Complex Nausea And Vomiting   Neosporin [Neomycin-Bacitracin Zn-Polymyx] Hives    Social History:   Social History   Socioeconomic History   Marital status: Married    Spouse name: Not on file   Number of children: Not on file   Years of education: Not on file   Highest education level: Not on file  Occupational History   Not on file  Tobacco Use   Smoking status: Former   Smokeless tobacco: Never  Substance and Sexual Activity   Alcohol use: No   Drug use: No   Sexual activity: Not on file  Other Topics Concern   Not on file  Social History Narrative   Not on file   Social Determinants of Health   Financial Resource Strain: Not on file  Food Insecurity: Not on file  Transportation Needs: Not on file  Physical Activity: Not on file  Stress: Not on file  Social Connections: Not on file  Intimate Partner Violence: Not on file    Family History:   No family history of HF  ROS:  Please see the history of present illness.  All other ROS reviewed and negative.     Physical Exam/Data:   Vitals:   09/06/21 1330 09/06/21 1345 09/06/21 1400 09/06/21 1445  BP: 112/87 110/83 111/88 (!) 107/95  Pulse: 86 81 90 82  Resp: (!) 21 18 19  (!) 22  Temp:      SpO2: 100% 100% 100% 100%   No intake or output data in the 24 hours ending 09/06/21 1542 Last 3 Weights 08/29/2021 08/25/2021 08/24/2021  Weight (lbs) 180 lb 174 lb 1.6 oz 174 lb 4.8 oz  Weight (kg) 81.647 kg 78.971 kg 79.062 kg     There is no height or weight on file to calculate BMI.   Gen: No distress, thin male   Neck: JVD to the jaw at 45 degrees Ears:  Pilar Plate Sign Cardiac: No Rubs or Gallops, III/VI holosystolic murmur, IRIR +1 radial pulses Respiratory: decreased breath sounds left base, normal effort, normal  respiratory rate GI: Soft, nontenderly distended with dullness to percussion MS: +1 bilateral  edema;  moves all extremities Integument: Skin feels warm Neuro:  At time of evaluation, alert and oriented to person/place/time/situation  Psych: Normal affect, patient feels OK   EKG:  The ECG that was done  was personally reviewed and demonstrates AF with RV pacing   Laboratory Data:  High Sensitivity Troponin:   Recent Labs  Lab 08/22/21 0510 08/22/21 1209 09/06/21 1116  TROPONINIHS 22* 24* 27*      Chemistry Recent Labs  Lab 09/04/21 1443 09/06/21 1242  NA 135 136  K 4.3 4.1  CL 103 103  CO2 22 25  GLUCOSE 91 94  BUN 46* 40*  CREATININE 2.32* 1.95*  CALCIUM 9.4 9.2  MG  --  2.2  GFRNONAA 30*  37*  ANIONGAP 10 8    Recent Labs  Lab 09/06/21 1242  PROT 7.1  ALBUMIN 3.4*  AST 34  ALT 32  ALKPHOS 114  BILITOT 2.8*   Lipids No results for input(s): CHOL, TRIG, HDL, LABVLDL, LDLCALC, CHOLHDL in the last 168 hours. Hematology Recent Labs  Lab 09/06/21 1242  WBC 6.7  RBC 5.05  HGB 15.4  HCT 47.5  MCV 94.1  MCH 30.5  MCHC 32.4  RDW 17.2*  PLT 330   Thyroid  Recent Labs  Lab 09/06/21 1242  TSH 8.683*   BNP Recent Labs  Lab 09/04/21 1443 09/06/21 1243  BNP 1,644.2* 1,614.8*    DDimer No results for input(s): DDIMER in the last 168 hours.   Radiology/Studies:  DG Chest 1 View  Result Date: 09/06/2021 CLINICAL DATA:  Syncope EXAM: CHEST  1 VIEW COMPARISON:  Chest x-ray 08/22/2021 FINDINGS: Cardiomegaly unchanged. Mediastinum appears stable. Calcified plaques in the aortic arch. Left-sided cardiac pacemaker device. Pulmonary vasculature appears within normal limits. Stable small left pleural effusion with associated atelectasis/infiltrate. No new consolidation identified. No pneumothorax. IMPRESSION: Cardiomegaly and stable small left pleural effusion with associated atelectasis/infiltrate. Electronically Signed   By: Ofilia Neas M.D.   On: 09/06/2021 10:24     Assessment and Plan:   Syncope with EF < 20% and ICD - device will need to  be interrogated (Bos Sci) - we are working on this now working getting this done  Heart Failure Reduced Ejection Fraction  Acute on Chronic Biventricular with moderate MR and TR CKD Stage IIIB - NYHA class IV, Stage D, hypervolemic, etiology from AF suspected - Diuretic regimen: starting 40 IV Lasix BID; this is underdosing but done in the setting of patients orthostatic hypotension -  Replace electrolytes PRN and keep K>4 and Mg>2. - unable to tolerate GDMT, at this time will not start entresto, SGLT2i  - when euvolemic will likely have higher creatinine; unclear long term HD plan  Low threshold for AHF consult  AF NOS CHASDVASC  - holding amiodarone now; if new VT will restart amiodarone; otherwise - eliquis -> heparin; Monday plan for RHC   CAD NOS - minimal troponin elevation -   Elevated TSH NOS - in the setting of potentially prior amiodarone use - Sending T3 and T4  For questions or updates, please contact [facility] Please consult [email or web address] for contact info under     Signed, Werner Lean, MD  09/06/2021 3:42 PM

## 2021-09-06 NOTE — Progress Notes (Signed)
Pt arrived to unit. Vitals stable.

## 2021-09-06 NOTE — H&P (Signed)
History and Physical    Isaac [name] TIR:[id number] DOB: 05/17/1951 DOA: 09/06/2021  PCP: [name], MD (Confirm with patient/family/NH records and if not entered, this has to be entered at [facility] point of entry) Patient coming from: Home  I have personally briefly reviewed patient's old medical records in [place]  Chief Complaint: Feeling weak, syncope  HPI: Isaac [name] is a 70 y.o. male with medical history significant of advanced stage of systolic CHF chronic LVEF less than 20%, status post AICD/PPM, PAF on Eliquis, CKD stage IIIb, HTN, HLD, came with repeated near syncope, nausea poor intake and generalized weakness.  Symptoms getting worse about 1 week ago, when he experienced profound lightheadedness every time standing up, and he decided to stop seizure medication including Lasix, amiodarone, metoprolol and spironolactone, but he continues to take midodrine which he believed to boost his blood pressure and keep him well.  Gradually, he started to experience exertional shortness of breath, increasing leg swelling, malaise, feeling nausea.  This morning he had a near syncope episode when trying to stand up, feeling lightheaded and then collapsed back to chair, no head injury, no loss of consciousness.  Denies any chest pain, no cough, no fever or chills.  ED Course: Patient was found to have borderline hypotension and positive orthostatic hypotension.  No hypoxia, afebrile.    Blood work showed stable CKD stage IIIb, elevated BNP, troponin borderline elevated pattern is flat.  X-ray showed persistent bilateral lung congestion and stable left small pleural effusion. TSH elevated >8.  Review of Systems: As per HPI otherwise 14 point review of systems negative.    Past Medical History:  Diagnosis Date   Arrhythmia    atrial fibrillation   CHF (congestive heart failure) ([place])    Chronic kidney disease    Coronary artery disease    Hyperlipidemia     Hypertension    Myocardial infarct [facility])     Past Surgical History:  Procedure Laterality Date   CARDIAC DEFIBRILLATOR PLACEMENT  feb 2014     reports that he has quit smoking. He has never used smokeless tobacco. He reports that he does not drink alcohol and does not use drugs.  Allergies  Allergen Reactions   Mushroom Extract Complex Nausea And Vomiting   Neosporin [Neomycin-Bacitracin Zn-Polymyx] Hives    No family history on file.   Prior to Admission medications   Medication Sig Start Date End Date Taking? Authorizing Provider  albuterol (VENTOLIN HFA) 108 (90 Base) MCG/ACT inhaler Inhale 2 puffs into the lungs every 6 (six) hours as needed. 05/01/21   [provider]  amiodarone (PACERONE) 200 MG tablet Take 1 tablet (200 mg total) by mouth 2 (two) times daily. Patient not taking: Reported on 08/29/2021 08/24/21 11/22/21  Terrilee Croak, MD  apixaban (ELIQUIS) 5 MG TABS tablet Take 1 tablet (5 mg total) by mouth 2 (two) times daily. Patient not taking: Reported on 08/29/2021 08/24/21 11/22/21  Terrilee Croak, MD  fluticasone (FLONASE) 50 MCG/ACT nasal spray Place 2 sprays into both nostrils daily. Patient not taking: Reported on 08/29/2021 10/25/18   [provider]  furosemide (LASIX) 20 MG tablet Take 1 tablet (20 mg total) by mouth daily as needed for edema or fluid (sob). Patient taking differently: Take 20 mg by mouth daily. 08/24/21 11/22/21  Terrilee Croak, MD  metoprolol tartrate (LOPRESSOR) 25 MG tablet Take 0.5 tablets (12.5 mg total) by mouth 2 (two) times daily. Patient not taking: Reported on 08/29/2021 08/24/21 11/22/21  Terrilee Croak, MD  midodrine (PROAMATINE) 2.5 MG tablet Take 1 tablet (2.5 mg total) by mouth 3 (three) times daily with meals. 08/24/21 11/22/21  Dahal, Marlowe Aschoff, MD  montelukast (SINGULAIR) 10 MG tablet Take by mouth. 10/23/19 10/22/20  [provider]  spironolactone (ALDACTONE) 25 MG tablet Take 0.5 tablets (12.5 mg total) by mouth  daily. Patient not taking: Reported on 08/29/2021 08/24/21 11/22/21  Terrilee Croak, MD  tamsulosin (FLOMAX) 0.4 MG CAPS capsule TAKE 1 CAPSULE (0.4 MG TOTAL) BY MOUTH DAILY AFTER BREAKFAST. Patient not taking: Reported on 08/29/2021 04/24/20   Nori Riis, PA-C    Physical Exam: Vitals:   09/06/21 1400 09/06/21 1445 09/06/21 1530 09/06/21 1630  BP: 111/88 (!) 107/95 104/84 103/85  Pulse: 90 82 80 (!) 46  Resp: 19 (!) 22 (!) 0 (!) 26  Temp:      SpO2: 100% 100% 100% 97%    Constitutional: NAD, calm, comfortable Vitals:   09/06/21 1400 09/06/21 1445 09/06/21 1530 09/06/21 1630  BP: 111/88 (!) 107/95 104/84 103/85  Pulse: 90 82 80 (!) 46  Resp: 19 (!) 22 (!) 0 (!) 26  Temp:      SpO2: 100% 100% 100% 97%   Eyes: PERRL, lids and conjunctivae normal ENMT: Mucous membranes are moist. Posterior pharynx clear of any exudate or lesions.Normal dentition.  Neck: normal, supple, no masses, no thyromegaly.  JVD to the jaw Respiratory: clear to auscultation bilaterally, no wheezing, no crackles. Normal respiratory effort. No accessory muscle use.  Cardiovascular: Regular rate and rhythm, no murmurs / rubs / gallops. 2+ extremity edema. 2+ pedal pulses. No carotid bruits.  Abdomen: no tenderness, no masses palpated. No hepatosplenomegaly. Bowel sounds positive.  Musculoskeletal: no clubbing / cyanosis. No joint deformity upper and lower extremities. Good ROM, no contractures. Normal muscle tone.  Skin: no rashes, lesions, ulcers. No induration Neurologic: CN 2-12 grossly intact. Sensation intact, DTR normal. Strength 5/5 in all 4.  Psychiatric: Normal judgment and insight. Alert and oriented x 3. Normal mood.    Labs on Admission: I have personally reviewed following labs and imaging studies  CBC: Recent Labs  Lab 09/06/21 1242  WBC 6.7  NEUTROABS 1.5*  HGB 15.4  HCT 47.5  MCV 94.1  PLT 606   Basic Metabolic Panel: Recent Labs  Lab 09/04/21 1443 09/06/21 1242  NA 135 136  K  4.3 4.1  CL 103 103  CO2 22 25  GLUCOSE 91 94  BUN 46* 40*  CREATININE 2.32* 1.95*  CALCIUM 9.4 9.2  MG  --  2.2  PHOS  --  3.8   GFR: Estimated Creatinine Clearance: 35.8 mL/min (A) (by C-G formula based on SCr of 1.95 mg/dL (H)). Liver Function Tests: Recent Labs  Lab 09/06/21 1242  AST 34  ALT 32  ALKPHOS 114  BILITOT 2.8*  PROT 7.1  ALBUMIN 3.4*   No results for input(s): LIPASE, AMYLASE in the last 168 hours. No results for input(s): AMMONIA in the last 168 hours. Coagulation Profile: No results for input(s): INR, PROTIME in the last 168 hours. Cardiac Enzymes: No results for input(s): CKTOTAL, CKMB, CKMBINDEX, TROPONINI in the last 168 hours. BNP (last 3 results) No results for input(s): PROBNP in the last 8760 hours. HbA1C: No results for input(s): HGBA1C in the last 72 hours. CBG: No results for input(s): GLUCAP in the last 168 hours. Lipid Profile: No results for input(s): CHOL, HDL, LDLCALC, TRIG, CHOLHDL, LDLDIRECT in the last 72 hours. Thyroid Function Tests: Recent Labs  09/06/21 1242  TSH 8.683*   Anemia Panel: No results for input(s): VITAMINB12, FOLATE, FERRITIN, TIBC, IRON, RETICCTPCT in the last 72 hours. Urine analysis:    Component Value Date/Time   APPEARANCEUR Cloudy (A) 04/01/2020 1026   GLUCOSEU Negative 04/01/2020 1026   BILIRUBINUR Negative 04/01/2020 1026   PROTEINUR 2+ (A) 04/01/2020 1026   NITRITE Negative 04/01/2020 1026   LEUKOCYTESUR 1+ (A) 04/01/2020 1026    Radiological Exams on Admission: DG Chest 1 View  Result Date: 09/06/2021 CLINICAL DATA:  Syncope EXAM: CHEST  1 VIEW COMPARISON:  Chest x-ray 08/22/2021 FINDINGS: Cardiomegaly unchanged. Mediastinum appears stable. Calcified plaques in the aortic arch. Left-sided cardiac pacemaker device. Pulmonary vasculature appears within normal limits. Stable small left pleural effusion with associated atelectasis/infiltrate. No new consolidation identified. No pneumothorax.  IMPRESSION: Cardiomegaly and stable small left pleural effusion with associated atelectasis/infiltrate. Electronically Signed   By: Ofilia Neas M.D.   On: 09/06/2021 10:24    EKG: Independently reviewed.  Paced, frequent PVCs.  Assessment/Plan Principal Problem:   CHF (congestive heart failure) (HCC) Active Problems:   Acute on chronic systolic CHF (congestive heart failure), NYHA class 3 ([place])  (please populate well all problems here in Problem List. (For example, if patient is on BP meds at home and you resume or decide to hold them, it is a problem that needs to be her. Same for CAD, COPD, HLD and so on)  Near Syncope -Discussed with cardiology fellow at bedside, will perform a PPM/ICD interrogation.  Main differential is vasovagal syncope from advanced CHF decompensation and orthostatic hypotension. -Agreed with continue midodrine, and hold other CHF medications for now including amiodarone, metoprolol and Aldactone.  Acute decompensation of chronic systolic CHF, impending cardiogenic shock -Clearly, has signs of fluid overload on exam but blood pressure borderline low to allow aggressive diuresis.  Discussed with cardiology fellow, plan to give 1 dose of IV Lasix today -Currently plans for right-sided cardiac cath on Monday.  PAF -In and out of pacing, hold metoprolol for hypotension -Start heparin drip for incoming Cath on Monday.  CKD stage IIIb -Fluid overload -Lasix x1, repeat BMP tomorrow.  Patient is DNR, thus not a candidate for hemodialysis.  Abnormal TSH -We will check T3 and T4, clinically suspect euthyroid sick syndrome -Patient was started on amiodarone last admission but stopped taking Amiodarone for >7 days, low suspicion for Amio toxicity.  DVT prophylaxis: Heparin drip Code Status: DNR Family Communication: Wife at bedside Disposition Plan: Patient is sick with significant advanced CHF decompensation, expect more than 2 midnight hospital stay for  cardiology intervention Consults called: Cardiology Admission status: Tele admit   Lequita Halt MD [facility] Pager [phone]  09/06/2021, 4:43 PM

## 2021-09-06 NOTE — Progress Notes (Signed)
ANTICOAGULATION CONSULT NOTE - Initial Consult  Pharmacy Consult for Heparin Indication: atrial fibrillation  Allergies  Allergen Reactions   Mushroom Extract Complex Nausea And Vomiting   Neosporin [Neomycin-Bacitracin Zn-Polymyx] Hives    Patient Measurements:   Heparin Dosing Weight: 81.6 kg  Vital Signs: Temp: 97.3 F (36.3 C) (12/17 0923) BP: 103/85 (12/17 1630) Pulse Rate: 46 (12/17 1630)  Labs: Recent Labs    09/04/21 1443 09/06/21 1116 09/06/21 1242  HGB  --   --  15.4  HCT  --   --  47.5  PLT  --   --  330  CREATININE 2.32*  --  1.95*  TROPONINIHS  --  27*  --     Estimated Creatinine Clearance: 35.8 mL/min (A) (by C-G formula based on SCr of 1.95 mg/dL (H)).   Medical History: Past Medical History:  Diagnosis Date   Arrhythmia    atrial fibrillation   CHF (congestive heart failure) (HCC)    Chronic kidney disease    Coronary artery disease    Hyperlipidemia    Hypertension    Myocardial infarct (HCC)     Medications:  (Not in a hospital admission)  Scheduled:   midodrine  2.5 mg Oral TID WC   montelukast  5 mg Oral QHS   sodium chloride flush  3 mL Intravenous Q12H   Infusions:   sodium chloride     PRN: sodium chloride, acetaminophen, albuterol, ondansetron (ZOFRAN) IV, sodium chloride flush  Assessment: 33 yom with a history of stage of systolic CHF chronic LVEF less than 20%, status post AICD/PPM, PAF on Eliquis, CKD stage IIIb, HTN, HLD. Patient is presenting with weakness, near syncope, and poor oral intake. Heparin per pharmacy consult placed for atrial fibrillation in preparation for RHC on Monday.  Patient is on apixaban prior to arrival. Last dose ~4am on 12/17. Will require aPTT monitoring due to likely falsely high anti-Xa level secondary to DOAC use.  Hgb 15.4; plt 330  Goal of Therapy:  Heparin level 0.3-0.7 units/ml aPTT 66-102 seconds Monitor platelets by anticoagulation protocol: Yes   Plan:  No initial heparin  bolus Start heparin infusion at 1200 units/hr Check aPTT & anti-Xa level in 8 hours and daily while on heparin Continue to monitor via aPTT until levels are correlated Continue to monitor H&H and platelets  Lorelei Pont, PharmD, BCPS 09/06/2021 5:03 PM ED Clinical Pharmacist -  [phone]

## 2021-09-06 NOTE — ED Triage Notes (Signed)
Pt to triage via [facility] from home.  Recently diagnosed with CHF.  Intermittently taking Lasix because he doesn't like that it makes him go to bathroom ans was having problems with BP.  Syncopal episode this morning.  Did not hit his head.  No blood thinners. States he assisted himself to the ground.  Reports SOB x 1 month.  Bilateral lower extremity edema.  EMS- 18g L AC CBG 144

## 2021-09-07 ENCOUNTER — Inpatient Hospital Stay (HOSPITAL_COMMUNITY): Payer: Medicare HMO

## 2021-09-07 ENCOUNTER — Inpatient Hospital Stay: Payer: Self-pay

## 2021-09-07 DIAGNOSIS — I4819 Other persistent atrial fibrillation: Secondary | ICD-10-CM

## 2021-09-07 DIAGNOSIS — I2510 Atherosclerotic heart disease of native coronary artery without angina pectoris: Secondary | ICD-10-CM

## 2021-09-07 DIAGNOSIS — I472 Ventricular tachycardia, unspecified: Secondary | ICD-10-CM

## 2021-09-07 DIAGNOSIS — R55 Syncope and collapse: Secondary | ICD-10-CM | POA: Diagnosis present

## 2021-09-07 LAB — MRSA NEXT GEN BY PCR, NASAL: MRSA by PCR Next Gen: NOT DETECTED

## 2021-09-07 LAB — COOXEMETRY PANEL
Carboxyhemoglobin: 0.6 % (ref 0.5–1.5)
Carboxyhemoglobin: 0.8 % (ref 0.5–1.5)
Methemoglobin: 0.7 % (ref 0.0–1.5)
Methemoglobin: 0.7 % (ref 0.0–1.5)
O2 Saturation: 36.6 %
O2 Saturation: 48.4 %
Total hemoglobin: 15.3 g/dL (ref 12.0–16.0)
Total hemoglobin: 13.6 g/dL (ref 12.0–16.0)

## 2021-09-07 LAB — CBC
HCT: 45.4 % (ref 39.0–52.0)
Hemoglobin: 15.1 g/dL (ref 13.0–17.0)
MCH: 30.7 pg (ref 26.0–34.0)
MCHC: 33.3 g/dL (ref 30.0–36.0)
MCV: 92.3 fL (ref 80.0–100.0)
Platelets: 326 10*3/uL (ref 150–400)
RBC: 4.92 MIL/uL (ref 4.22–5.81)
RDW: 17.1 % — ABNORMAL HIGH (ref 11.5–15.5)
WBC: 6.2 10*3/uL (ref 4.0–10.5)
nRBC: 0 % (ref 0.0–0.2)

## 2021-09-07 LAB — APTT
aPTT: 75 seconds — ABNORMAL HIGH (ref 24–36)
aPTT: 128 seconds — ABNORMAL HIGH (ref 24–36)
aPTT: 195 seconds (ref 24–36)

## 2021-09-07 LAB — BASIC METABOLIC PANEL
Anion gap: 10 (ref 5–15)
BUN: 40 mg/dL — ABNORMAL HIGH (ref 8–23)
CO2: 24 mmol/L (ref 22–32)
Calcium: 9.1 mg/dL (ref 8.9–10.3)
Chloride: 102 mmol/L (ref 98–111)
Creatinine, Ser: 1.99 mg/dL — ABNORMAL HIGH (ref 0.61–1.24)
GFR, Estimated: 36 mL/min — ABNORMAL LOW (ref >60)
Glucose, Bld: 90 mg/dL (ref 70–99)
Potassium: 3.8 mmol/L (ref 3.5–5.1)
Sodium: 136 mmol/L (ref 135–145)

## 2021-09-07 LAB — HEPARIN LEVEL (UNFRACTIONATED)
Heparin Unfractionated: >1.1 IU/mL — ABNORMAL HIGH (ref 0.30–0.70)
Heparin Unfractionated: >1.1 IU/mL — ABNORMAL HIGH (ref 0.30–0.70)

## 2021-09-07 LAB — T4, FREE: Free T4: 1.65 ng/dL — ABNORMAL HIGH (ref 0.61–1.12)

## 2021-09-07 LAB — LACTIC ACID, PLASMA: Lactic Acid, Venous: 2.2 mmol/L (ref 0.5–1.9)

## 2021-09-07 MED ORDER — FUROSEMIDE 10 MG/ML IJ SOLN: 80.0000 mg | Freq: Two times a day (BID) | INTRAMUSCULAR | Status: DC

## 2021-09-07 MED ORDER — AMIODARONE HCL IN DEXTROSE 360-4.14 MG/200ML-% IV SOLN
30.0000 mg/h | INTRAVENOUS | Status: DC
  Administered 2021-09-07 – 2021-09-09 (×5): 30 mg/h via INTRAVENOUS
  Filled 2021-09-07 (×5): qty 200

## 2021-09-07 MED ORDER — METOPROLOL TARTRATE 12.5 MG HALF TABLET
12.5000 mg | ORAL_TABLET | Freq: Two times a day (BID) | ORAL | Status: DC
  Administered 2021-09-07: 11:00:00 12.5 mg via ORAL
  Filled 2021-09-07: qty 1

## 2021-09-07 MED ORDER — CHLORHEXIDINE GLUCONATE CLOTH 2 % EX PADS
6.0000 | MEDICATED_PAD | Freq: Every day | CUTANEOUS | Status: DC
  Administered 2021-09-07 – 2021-09-15 (×8): 6 via TOPICAL

## 2021-09-07 MED ORDER — ROSUVASTATIN CALCIUM 5 MG PO TABS
10.0000 mg | ORAL_TABLET | Freq: Every day | ORAL | Status: DC
  Administered 2021-09-07 – 2021-09-16 (×9): 10 mg via ORAL
  Filled 2021-09-07 (×9): qty 2

## 2021-09-07 MED ORDER — MILRINONE LACTATE IN DEXTROSE 20-5 MG/100ML-% IV SOLN
0.1250 ug/kg/min | INTRAVENOUS | Status: DC
  Administered 2021-09-08 – 2021-09-14 (×16): 0.375 ug/kg/min via INTRAVENOUS
  Administered 2021-09-15 – 2021-09-16 (×5): 0.5 ug/kg/min via INTRAVENOUS
  Administered 2021-09-17 – 2021-09-24 (×9): 0.25 ug/kg/min via INTRAVENOUS
  Administered 2021-09-25 – 2021-09-29 (×4): 0.125 ug/kg/min via INTRAVENOUS
  Filled 2021-09-07 (×20): qty 100
  Filled 2021-09-07: qty 200
  Filled 2021-09-07 (×12): qty 100

## 2021-09-07 MED ORDER — SODIUM CHLORIDE 0.9% FLUSH: 3.0000 mL | INTRAVENOUS | Status: DC | PRN

## 2021-09-07 MED ORDER — HEPARIN (PORCINE) 25000 UT/250ML-% IV SOLN
1050.0000 [IU]/h | INTRAVENOUS | Status: DC
  Administered 2021-09-07 (×2): 1050 [IU]/h via INTRAVENOUS
  Filled 2021-09-07 (×2): qty 250

## 2021-09-07 MED ORDER — AMIODARONE HCL IN DEXTROSE 360-4.14 MG/200ML-% IV SOLN: 30.0000 mg/h | INTRAVENOUS | Status: DC

## 2021-09-07 MED ORDER — SODIUM CHLORIDE 0.9 % IV SOLN: INTRAVENOUS | Status: DC

## 2021-09-07 MED ORDER — AMIODARONE HCL IN DEXTROSE 360-4.14 MG/200ML-% IV SOLN
60.0000 mg/h | INTRAVENOUS | Status: DC
  Administered 2021-09-07: 18:00:00 60 mg/h via INTRAVENOUS
  Filled 2021-09-07: qty 200

## 2021-09-07 MED ORDER — POTASSIUM CHLORIDE 20 MEQ PO PACK
20.0000 meq | PACK | Freq: Every day | ORAL | Status: DC
  Administered 2021-09-07 – 2021-09-09 (×3): 20 meq via ORAL
  Filled 2021-09-07 (×3): qty 1

## 2021-09-07 MED ORDER — SODIUM CHLORIDE 0.9 % IV SOLN: 250.0000 mL | INTRAVENOUS | Status: DC | PRN

## 2021-09-07 MED ORDER — MILRINONE LACTATE IN DEXTROSE 20-5 MG/100ML-% IV SOLN
0.2500 ug/kg/min | INTRAVENOUS | Status: DC
  Administered 2021-09-07: 18:00:00 0.25 ug/kg/min via INTRAVENOUS
  Filled 2021-09-07 (×2): qty 100

## 2021-09-07 MED ORDER — AMIODARONE LOAD VIA INFUSION
150.0000 mg | Freq: Once | INTRAVENOUS | Status: AC
  Administered 2021-09-07: 18:00:00 150 mg via INTRAVENOUS
  Filled 2021-09-07: qty 83.34

## 2021-09-07 MED ORDER — ASPIRIN 81 MG PO CHEW
81.0000 mg | CHEWABLE_TABLET | ORAL | Status: AC
  Administered 2021-09-08: 06:00:00 81 mg via ORAL
  Filled 2021-09-07: qty 1

## 2021-09-07 MED ORDER — POTASSIUM CHLORIDE CRYS ER 20 MEQ PO TBCR
40.0000 meq | EXTENDED_RELEASE_TABLET | Freq: Once | ORAL | Status: AC
  Administered 2021-09-07: 16:00:00 40 meq via ORAL
  Filled 2021-09-07: qty 2

## 2021-09-07 MED ORDER — FUROSEMIDE 10 MG/ML IJ SOLN
80.0000 mg | Freq: Once | INTRAMUSCULAR | Status: AC
  Administered 2021-09-07: 13:00:00 80 mg via INTRAVENOUS
  Filled 2021-09-07: qty 8

## 2021-09-07 MED ORDER — SODIUM CHLORIDE 0.9% FLUSH: 10.0000 mL | INTRAVENOUS | Status: DC | PRN

## 2021-09-07 MED ORDER — SODIUM CHLORIDE 0.9% FLUSH
3.0000 mL | Freq: Two times a day (BID) | INTRAVENOUS | Status: DC
  Administered 2021-09-10 – 2021-09-11 (×2): 3 mL via INTRAVENOUS

## 2021-09-07 MED ORDER — SODIUM CHLORIDE 0.9% FLUSH
10.0000 mL | Freq: Two times a day (BID) | INTRAVENOUS | Status: DC
  Administered 2021-09-08 – 2021-09-15 (×10): 10 mL
  Administered 2021-09-16: 10:00:00 30 mL

## 2021-09-07 MED ORDER — FUROSEMIDE 10 MG/ML IJ SOLN
20.0000 mg/h | INTRAVENOUS | Status: DC
  Administered 2021-09-07 (×2): 12 mg/h via INTRAVENOUS
  Administered 2021-09-08: 06:00:00 20 mg/h via INTRAVENOUS
  Filled 2021-09-07 (×3): qty 20

## 2021-09-07 MED ORDER — AMIODARONE HCL IN DEXTROSE 360-4.14 MG/200ML-% IV SOLN: 60.0000 mg/h | INTRAVENOUS | Status: DC

## 2021-09-07 NOTE — Progress Notes (Signed)
ANTICOAGULATION CONSULT NOTE   Pharmacy Consult for Heparin Indication: atrial fibrillation  Allergies  Allergen Reactions   Mushroom Extract Complex Nausea And Vomiting   Neosporin [Neomycin-Bacitracin Zn-Polymyx] Hives    Patient Measurements: Height: 5\' 9"  (175.3 cm) Weight: 78.2 kg (172 lb 6.4 oz) IBW/kg (Calculated) : 70.7 Heparin Dosing Weight: 81.6 kg  Vital Signs: Temp: 97.2 F (36.2 C) (12/18 1400) Temp Source: Oral (12/18 1400) BP: 96/76 (12/18 1500) Pulse Rate: 81 (12/18 1500)  Labs: Recent Labs    09/06/21 1116 09/06/21 1242 09/06/21 2039 09/06/21 2039 09/06/21 2047 09/07/21 0347 09/07/21 1140 09/07/21 1456  HGB  --  15.4  --   --   --   --  15.1  --   HCT  --  47.5  --   --   --   --  45.4  --   PLT  --  330  --   --   --   --  326  --   APTT  --   --  38*   < >  --  75* 128* 195*  LABPROT  --   --  22.5*  --   --   --   --   --   INR  --   --  2.0*  --   --   --   --   --   HEPARINUNFRC  --   --   --   --  >1.10* >1.10* >1.10*  --   CREATININE  --  1.95*  --   --   --  1.99*  --   --   TROPONINIHS 27*  --   --   --   --   --   --   --    < > = values in this interval not displayed.     Estimated Creatinine Clearance: 35 mL/min (A) (by C-G formula based on SCr of 1.99 mg/dL (H)).   Medical History: Past Medical History:  Diagnosis Date   Arrhythmia    atrial fibrillation   CHF (congestive heart failure) (HCC)    Chronic kidney disease    Coronary artery disease    Hyperlipidemia    Hypertension    Myocardial infarct (HCC)     Medications:  Medications Prior to Admission  Medication Sig Dispense Refill Last Dose   albuterol (VENTOLIN HFA) 108 (90 Base) MCG/ACT inhaler Inhale 2 puffs into the lungs every 6 (six) hours as needed.   unknown   apixaban (ELIQUIS) 5 MG TABS tablet Take 1 tablet (5 mg total) by mouth 2 (two) times daily. 60 tablet 2 09/06/2021 at 0500   furosemide (LASIX) 20 MG tablet Take 1 tablet (20 mg total) by mouth daily  as needed for edema or fluid (sob). (Patient taking differently: Take 20 mg by mouth daily.) 90 tablet 0 Past Week   midodrine (PROAMATINE) 2.5 MG tablet Take 1 tablet (2.5 mg total) by mouth 3 (three) times daily with meals. 90 tablet 2 09/06/2021   amiodarone (PACERONE) 200 MG tablet Take 1 tablet (200 mg total) by mouth 2 (two) times daily. (Patient not taking: Reported on 08/29/2021) 60 tablet 2 Not Taking   metoprolol tartrate (LOPRESSOR) 25 MG tablet Take 0.5 tablets (12.5 mg total) by mouth 2 (two) times daily. (Patient not taking: Reported on 08/29/2021) 30 tablet 2 Not Taking   montelukast (SINGULAIR) 10 MG tablet Take by mouth. (Patient not taking: Reported on 09/06/2021)   Not Taking   spironolactone (  ALDACTONE) 25 MG tablet Take 0.5 tablets (12.5 mg total) by mouth daily. (Patient not taking: Reported on 08/29/2021) 15 tablet 2 Not Taking   tamsulosin (FLOMAX) 0.4 MG CAPS capsule TAKE 1 CAPSULE (0.4 MG TOTAL) BY MOUTH DAILY AFTER BREAKFAST. (Patient not taking: Reported on 08/29/2021) 30 capsule 0 Not Taking    Scheduled:   amiodarone  150 mg Intravenous Once   Chlorhexidine Gluconate Cloth  6 each Topical Daily   midodrine  2.5 mg Oral TID WC   montelukast  5 mg Oral QHS   potassium chloride  20 mEq Oral Daily   rosuvastatin  10 mg Oral Daily   sodium chloride flush  3 mL Intravenous Q12H   sodium chloride flush  3 mL Intravenous Q12H   Infusions:   sodium chloride     amiodarone     Followed by   amiodarone     furosemide (LASIX) 200 mg in dextrose 5% 100 mL (2mg /mL) infusion     heparin 1,200 Units/hr (09/07/21 1300)   milrinone     PRN: sodium chloride, acetaminophen, albuterol, ondansetron (ZOFRAN) IV, sodium chloride flush  Assessment: 68 yom with a history of stage of systolic CHF chronic LVEF less than 20%, status post AICD/PPM, PAF on Eliquis, CKD stage IIIb, HTN, HLD. Patient is presenting with weakness, near syncope, and poor oral intake. Heparin per pharmacy consult  placed for atrial fibrillation in preparation for RHC on Monday.  Patient is on apixaban prior to arrival. Last dose ~4am on 12/17.   Repeat aPTT this afternoon is elevated at 195 seconds. No S/Sx bleeding per RN.  Goal of Therapy:  Heparin level 0.3-0.7 units/ml aPTT 66-102 seconds Monitor platelets by anticoagulation protocol: Yes   Plan:  Hold heparin x30 minutes Resume 1050 units/h Repeat aPTT in 8h  Arrie Senate, PharmD, [place], [facility] Clinical Pharmacist [phone] Please check AMION for all [place] numbers 09/07/2021

## 2021-09-07 NOTE — Progress Notes (Signed)
[facility]                                                                              Patient Demographics  Isaac [name], is a 70 y.o. male, DOB - Jan 17, 1951, JJH:[id number]  Admit date - 09/06/2021   Admitting Physician Lequita Halt, MD  Outpatient Primary MD for the patient is [name], MD  Outpatient specialists:   LOS - 1  days   Medical records reviewed and are as summarized below:    Chief Complaint  Patient presents with   Shortness of Breath   syncopal episode       Brief summary   Patient is a 70 year old male with chronic combined systolic and diastolic CHF, EF less than 20%, status post AICD/PPM, paroxysmal A. fib on Eliquis, CKD stage IIIb, HTN, HLD presented with near syncopal episode, nausea, poor oral intake and generalized weakness. Patient was recently hospitalized 08/22/2021-08/25/2021 when he was admitted with acute on chronic systolic CHF, A. fib with RVR, AKI on CKD stage III Symptoms worsened in last 1 week when he experienced profound lightheadedness on standing up.  He decided to stop several medications including Lasix, amiodarone, metoprolol and Aldactone but continue to take midodrine.  On the morning of admission, had a near syncopal episode when trying to stand up, felt lightheaded, collapsed back to the chair, no loss of consciousness. In ED, patient was found to have borderline hypotension and orthostatic positive.  Labs showed elevated BNP, borderline elevated troponin, flat pattern.  Chest x-ray showed cardiomegaly, stable small left pleural effusion with associated atelectasis/infiltrate.  TSH elevated  Assessment & Plan    Principal Problem: Near syncope -Likely vasovagal/orthostatic.  Continue midodrine - recent 2D echo 12/3 had shown EF less than 20%, global hypokinesis, G3 DD, moderate MR, TR -Per cardiology, device interrogated, frequent events and VT 1 zone and one episode requiring ATP X2 around the syncopal  event. -Plan for cardiac cath on 12/19  Active Problems:   CAD (coronary artery disease), acute on chronic combined biventricular CHF, moderate MR, TR, history of ischemic cardiomyopathy status post AICD -Cardiology following, on IV Lasix 80 mg twice daily.  Minimal troponin elevation. -On low-dose beta-blocker however unlikely to tolerate other medications due to borderline BP. -CHA2DS2-VASc score 4, was on Eliquis, currently on heparin drip    A-fib (HCC), persistent -On heparin drip, rate controlled,  -Plan for cardiac cath in a.m.    Elevated TSH, elevated T4 -T3 pending -Likely euthyroid sick syndrome.  Patient was started on amiodarone during the previous admission however has stopped taking it for more than a week. -Needs to repeat labs in 4-weeks, outpatient and further management if needed.  CKD stage IIIa -Creatinine of 2.3 on 12/15, improving, 1.9 on admission  Code Status: Full CODE STATUS DVT Prophylaxis:    Heparin drip   Level of Care: Level of care: Telemetry Medical Family Communication: Discussed all imaging results, lab results, explained to the patient and wife at the bedside   Disposition Plan:     Status is: Inpatient  Remains inpatient appropriate because: Cardiac cath planned in a.m.  Time Spent in minutes  25 minutes  Procedures:  ICD interrogation  Consultants:   Cardiology  Antimicrobials:   Anti-infectives (From admission, onward)    None          Medications  Scheduled Meds:  furosemide  80 mg Intravenous BID   metoprolol tartrate  12.5 mg Oral BID   midodrine  2.5 mg Oral TID WC   montelukast  5 mg Oral QHS   potassium chloride  20 mEq Oral Daily   sodium chloride flush  3 mL Intravenous Q12H   Continuous Infusions:  sodium chloride     heparin 1,200 Units/hr (09/06/21 2135)   PRN Meds:.sodium chloride, acetaminophen, albuterol, ondansetron (ZOFRAN) IV, sodium chloride flush      Subjective:   [name] was  seen and examined today.  Currently no acute chest pain or shortness of breath.  No syncopal episode overnight.  No fevers, nausea vomiting or abdominal pain.  Objective:   Vitals:   09/07/21 0035 09/07/21 0419 09/07/21 0737 09/07/21 1127  BP: 108/79 102/75 106/82 94/83  Pulse: (!) 102 82 87 92  Resp: 19 18 17 19   Temp: (!) 97.3 F (36.3 C) (!) 97.5 F (36.4 C) 97.6 F (36.4 C) (!) 97.2 F (36.2 C)  TempSrc: Oral Oral Oral Axillary  SpO2: 99% 98% 100% 98%  Weight:  78.2 kg    Height:  5\' 9"  (1.753 m)      Intake/Output Summary (Last 24 hours) at 09/07/2021 1139 Last data filed at 09/07/2021 0600 Gross per 24 hour  Intake 100.57 ml  Output 125 ml  Net -24.43 ml     Wt Readings from Last 3 Encounters:  09/07/21 78.2 kg  08/29/21 81.6 kg  08/25/21 79 kg     Exam General: Alert and oriented x 3, NAD Cardiovascular: Irregular, tachycardia Respirator: diminished breath sound at the bases Gastrointestinal: Soft, nontender, nondistended, + bowel sounds Ext: 1+ pedal edema bilaterally Neuro: moving all 4 extremities Psych: Normal affect and demeanor, alert and oriented x3    Data Reviewed:  I have personally reviewed following labs and imaging studies  Micro Results Recent Results (from the past 240 hour(s))  Resp Panel by RT-PCR (Flu A&B, Covid) Nasopharyngeal Swab     Status: None   Collection Time: 09/06/21 10:21 AM   Specimen: Nasopharyngeal Swab; Nasopharyngeal(NP) swabs in vial transport medium  Result Value Ref Range Status   SARS Coronavirus 2 by RT PCR NEGATIVE NEGATIVE Final    Comment: (NOTE) SARS-CoV-2 target nucleic acids are NOT DETECTED.  The SARS-CoV-2 RNA is generally detectable in upper respiratory specimens during the acute phase of infection. The lowest concentration of SARS-CoV-2 viral copies this assay can detect is 138 copies/mL. A negative result does not preclude SARS-Cov-2 infection and should not be used as the sole basis for treatment  or other patient management decisions. A negative result may occur with  improper specimen collection/handling, submission of specimen other than nasopharyngeal swab, presence of viral mutation(s) within the areas targeted by this assay, and inadequate number of viral copies(<138 copies/mL). A negative result must be combined with clinical observations, patient history, and epidemiological information. The expected result is Negative.  Fact Sheet for Patients:  EntrepreneurPulse.com.au  Fact Sheet for Healthcare Providers:  IncredibleEmployment.be  This test is no t yet approved or cleared by the [place] FDA and  has been authorized for detection and/or diagnosis of SARS-CoV-2 by FDA under an Emergency Use Authorization (EUA). This EUA will remain  in effect (meaning this test  can be used) for the duration of the COVID-19 declaration under Section 564(b)(1) of the Act, 21 U.S.C.section 360bbb-3(b)(1), unless the authorization is terminated  or revoked sooner.       Influenza A by PCR NEGATIVE NEGATIVE Final   Influenza B by PCR NEGATIVE NEGATIVE Final    Comment: (NOTE) The Xpert Xpress SARS-CoV-2/FLU/RSV plus assay is intended as an aid in the diagnosis of influenza from Nasopharyngeal swab specimens and should not be used as a sole basis for treatment. Nasal washings and aspirates are unacceptable for Xpert Xpress SARS-CoV-2/FLU/RSV testing.  Fact Sheet for Patients: EntrepreneurPulse.com.au  Fact Sheet for Healthcare Providers: IncredibleEmployment.be  This test is not yet approved or cleared by the [place] FDA and has been authorized for detection and/or diagnosis of SARS-CoV-2 by FDA under an Emergency Use Authorization (EUA). This EUA will remain in effect (meaning this test can be used) for the duration of the COVID-19 declaration under Section 564(b)(1) of the Act, 21 U.S.C. section  360bbb-3(b)(1), unless the authorization is terminated or revoked.  Performed at [facility], [place] [address] [place], [place] [address]     Radiology Reports DG Chest 1 View  Result Date: 09/06/2021 CLINICAL DATA:  Syncope EXAM: CHEST  1 VIEW COMPARISON:  Chest x-ray 08/22/2021 FINDINGS: Cardiomegaly unchanged. Mediastinum appears stable. Calcified plaques in the aortic arch. Left-sided cardiac pacemaker device. Pulmonary vasculature appears within normal limits. Stable small left pleural effusion with associated atelectasis/infiltrate. No new consolidation identified. No pneumothorax. IMPRESSION: Cardiomegaly and stable small left pleural effusion with associated atelectasis/infiltrate. Electronically Signed   By: Ofilia Neas M.D.   On: 09/06/2021 10:24   DG Chest 2 View  Result Date: 08/22/2021 CLINICAL DATA:  70 year old male with shortness of breath since 20/1 100 hours, worse when lying flat. EXAM: CHEST - 2 VIEW COMPARISON:  Chest radiograph 03/03/2020 and earlier. FINDINGS: Stable left chest AICD. Stable cardiac size at the upper limits of normal. Other mediastinal contours are within normal limits. Visualized tracheal air column is within normal limits. Confluent new veiling opacity at the left lung base most resembles a small to moderate pleural effusion on the lateral view. No definite air bronchograms. Increased pulmonary interstitial opacity, favor vascular congestion. No pneumothorax. Right lung otherwise clear. No acute osseous abnormality identified. Negative visible bowel gas. IMPRESSION: New small to moderate left pleural effusion and increased pulmonary vascular congestion compatible with mild interstitial edema. Electronically Signed   By: Genevie Ann M.D.   On: 08/22/2021 05:47   ECHOCARDIOGRAM COMPLETE  Result Date: 08/24/2021    ECHOCARDIOGRAM REPORT   Patient Name:   SYLVESTRE RATHGEBER Date of Exam: 08/23/2021 Medical Rec #:  [id number]           Height:       69.0  in Accession #:    [id number]          Weight:       175.0 lb Date of Birth:  15-Feb-1951            BSA:          1.952 m Patient Age:    57 years            BP:           101/86 mmHg Patient Gender: M                   HR:           106 bpm. Exam Location:  [facility] Procedure: 2D Echo and Intracardiac Opacification Agent Indications:     Systolic CHF  History:         Patient has no prior history of Echocardiogram examinations.                  CAD and Previous Myocardial Infarction, Defibrillator,                  Arrythmias:Atrial Fibrillation; Risk Factors:Hypertension and                  Former Smoker.  Sonographer:     L Thornton-Maynard Referring Phys:  [id number] MATTHEW M ECKSTAT Diagnosing Phys: Yolonda Kida MD IMPRESSIONS  1. Left ventricular ejection fraction, by estimation, is <20%. The left ventricle has severely decreased function. The left ventricle demonstrates global hypokinesis. The left ventricular internal cavity size was severely dilated. Left ventricular diastolic parameters are consistent with Grade III diastolic dysfunction (restrictive). Elevated left ventricular end-diastolic pressure.  2. Right ventricular systolic function is severely reduced. The right ventricular size is moderately enlarged. There is moderately elevated pulmonary artery systolic pressure.  3. Left atrial size was mild to moderately dilated.  4. Right atrial size was mild to moderately dilated.  5. The mitral valve is grossly normal. Moderate mitral valve regurgitation.  6. Tricuspid valve regurgitation is moderate to severe.  7. The aortic valve is grossly normal. Aortic valve regurgitation is not visualized. FINDINGS  Left Ventricle: Left ventricular ejection fraction, by estimation, is <20%. The left ventricle has severely decreased function. The left ventricle demonstrates global hypokinesis. Definity contrast agent was given IV to delineate the left ventricular endocardial borders. The left ventricular internal  cavity size was severely dilated. There is no left ventricular hypertrophy. Left ventricular diastolic parameters are consistent with Grade III diastolic dysfunction (restrictive). Elevated left ventricular end-diastolic pressure. Right Ventricle: The right ventricular size is moderately enlarged. No increase in right ventricular wall thickness. Right ventricular systolic function is severely reduced. There is moderately elevated pulmonary artery systolic pressure. The tricuspid regurgitant velocity is 3.18 m/s, and with an assumed right atrial pressure of 15 mmHg, the estimated right ventricular systolic pressure is 87.5 mmHg. Left Atrium: Left atrial size was mild to moderately dilated. Right Atrium: Right atrial size was mild to moderately dilated. Pericardium: Trivial pericardial effusion is present. Mitral Valve: The mitral valve is grossly normal. The E-point septal separation is normal. Moderate mitral valve regurgitation. MV peak gradient, 3.1 mmHg. The mean mitral valve gradient is 2.0 mmHg. Tricuspid Valve: The tricuspid valve is grossly normal. Tricuspid valve regurgitation is moderate to severe. Aortic Valve: The aortic valve is grossly normal. Aortic valve regurgitation is not visualized. Aortic valve peak gradient measures 3.5 mmHg. Pulmonic Valve: The pulmonic valve was normal in structure. Pulmonic valve regurgitation is trivial. Aorta: The ascending aorta was not well visualized. IAS/Shunts: No atrial level shunt detected by color flow Doppler. Additional Comments: A device lead is visualized. There is a small pleural effusion in the left lateral region.  LEFT VENTRICLE PLAX 2D LVIDd:         6.01 cm      Diastology LVIDs:         5.85 cm      LV e' medial:    2.25 cm/s LV PW:         0.79 cm      LV E/e' medial:  39.0 LV IVS:        0.96 cm  LV e' lateral:   5.40 cm/s LVOT diam:     2.30 cm      LV E/e' lateral: 16.3 LV SV:         27 LV SV Index:   14 LVOT Area:     4.15 cm  LV Volumes (MOD)  LV vol d, MOD A2C: 256.0 ml LV vol d, MOD A4C: 132.5 ml LV vol s, MOD A2C: 163.0 ml LV vol s, MOD A4C: 121.0 ml LV SV MOD A2C:     93.0 ml LV SV MOD A4C:     132.5 ml LV SV MOD BP:      40.3 ml RIGHT VENTRICLE RV S prime:     4.33 cm/s TAPSE (M-mode): 0.9 cm LEFT ATRIUM             Index        RIGHT ATRIUM           Index LA diam:        4.50 cm 2.31 cm/m   RA Area:     24.00 cm LA Vol (A2C):   78.3 ml 40.11 ml/m  RA Volume:   77.90 ml  39.91 ml/m LA Vol (A4C):   88.8 ml 45.49 ml/m LA Biplane Vol: 89.2 ml 45.70 ml/m  AORTIC VALVE                 PULMONIC VALVE AV Area (Vmax): 1.96 cm     PV Vmax:          0.43 m/s AV Vmax:        93.00 cm/s   PV Peak grad:     0.7 mmHg AV Peak Grad:   3.5 mmHg     PR End Diast Vel: 5.81 msec LVOT Vmax:      43.85 cm/s LVOT Vmean:     28.100 cm/s LVOT VTI:       0.066 m  AORTA Ao Root diam: 3.90 cm Ao Asc diam:  4.00 cm MITRAL VALVE               TRICUSPID VALVE MV Area (PHT): 7.16 cm    TR Peak grad:   40.4 mmHg MV Area VTI:   1.47 cm    TR Vmax:        318.00 cm/s MV Peak grad:  3.1 mmHg MV Mean grad:  2.0 mmHg    SHUNTS MV Vmax:       0.88 m/s    Systemic VTI:  0.07 m MV Vmean:      66.3 cm/s   Systemic Diam: 2.30 cm MV Decel Time: 106 msec MV E velocity: 87.80 cm/s Dwayne D Callwood MD Electronically signed by Yolonda Kida MD Signature Date/Time: 08/24/2021/9:33:53 AM    Final     Lab Data:  CBC: Recent Labs  Lab 09/06/21 1242  WBC 6.7  NEUTROABS 1.5*  HGB 15.4  HCT 47.5  MCV 94.1  PLT 326   Basic Metabolic Panel: Recent Labs  Lab 09/04/21 1443 09/06/21 1242 09/07/21 0347  NA 135 136 136  K 4.3 4.1 3.8  CL 103 103 102  CO2 22 25 24   GLUCOSE 91 94 90  BUN 46* 40* 40*  CREATININE 2.32* 1.95* 1.99*  CALCIUM 9.4 9.2 9.1  MG  --  2.2  --   PHOS  --  3.8  --    GFR: Estimated Creatinine Clearance: 35 mL/min (A) (by C-G formula based on SCr of 1.99 mg/dL (H)). Liver  Function Tests: Recent Labs  Lab 09/06/21 1242  AST 34  ALT 32   ALKPHOS 114  BILITOT 2.8*  PROT 7.1  ALBUMIN 3.4*   No results for input(s): LIPASE, AMYLASE in the last 168 hours. No results for input(s): AMMONIA in the last 168 hours. Coagulation Profile: Recent Labs  Lab 09/06/21 2039  INR 2.0*   Cardiac Enzymes: No results for input(s): CKTOTAL, CKMB, CKMBINDEX, TROPONINI in the last 168 hours. BNP (last 3 results) No results for input(s): PROBNP in the last 8760 hours. HbA1C: No results for input(s): HGBA1C in the last 72 hours. CBG: No results for input(s): GLUCAP in the last 168 hours. Lipid Profile: No results for input(s): CHOL, HDL, LDLCALC, TRIG, CHOLHDL, LDLDIRECT in the last 72 hours. Thyroid Function Tests: Recent Labs    09/06/21 1242 09/06/21 1642 09/07/21 0347  TSH 8.683*  --   --   FREET4  --    < > 1.65*   < > = values in this interval not displayed.   Anemia Panel: No results for input(s): VITAMINB12, FOLATE, FERRITIN, TIBC, IRON, RETICCTPCT in the last 72 hours. Urine analysis:    Component Value Date/Time   APPEARANCEUR Cloudy (A) 04/01/2020 1026   GLUCOSEU Negative 04/01/2020 1026   BILIRUBINUR Negative 04/01/2020 1026   PROTEINUR 2+ (A) 04/01/2020 1026   NITRITE Negative 04/01/2020 1026   LEUKOCYTESUR 1+ (A) 04/01/2020 1026     Jaimarie Rapozo M.D. [facility] 09/07/2021, 11:39 AM  Available via Epic secure chat 7am-7pm After 7 pm, please refer to night coverage provider listed on amion.

## 2021-09-07 NOTE — Progress Notes (Signed)
Peripherally Inserted Central Catheter Placement  The IV Nurse has discussed with the patient and/or persons authorized to consent for the patient, the purpose of this procedure and the potential benefits and risks involved with this procedure.  The benefits include less needle sticks, lab draws from the catheter, and the patient may be discharged home with the catheter. Risks include, but not limited to, infection, bleeding, blood clot (thrombus formation), and puncture of an artery; nerve damage and irregular heartbeat and possibility to perform a PICC exchange if needed/ordered by physician.  Alternatives to this procedure were also discussed.  Bard Power PICC patient education guide, fact sheet on infection prevention and patient information card has been provided to patient /or left at bedside.    PICC Placement Documentation  PICC Triple Lumen 09/07/21 PICC Right Brachial 38 cm 0 cm (Active)  Indication for Insertion or Continuance of Line Vasoactive infusions;Chronic illness with exacerbations (CF, Sickle Cell, etc.) 09/07/21 1714  Exposed Catheter (cm) 0 cm 09/07/21 1714  Site Assessment Clean;Dry;Intact 09/07/21 1714  Lumen #1 Status Flushed;Saline locked;Blood return noted 09/07/21 1714  Lumen #2 Status Flushed;Saline locked;Blood return noted 09/07/21 1714  Lumen #3 Status Flushed;Saline locked;Blood return noted 09/07/21 1714  Dressing Type Transparent 09/07/21 1714  Dressing Status Clean;Dry;Intact 09/07/21 1714  Antimicrobial disc in place? Yes 09/07/21 1714  Safety Lock Not Applicable 74/82/70 7867  Line Care Connections checked and tightened 09/07/21 1714  Line Adjustment (NICU/IV Team Only) No 09/07/21 1714  Dressing Intervention New dressing 09/07/21 1714  Dressing Change Due 09/14/21 09/07/21 1714       Rolena Infante 09/07/2021, 5:17 PM

## 2021-09-07 NOTE — Progress Notes (Signed)
Tele called stated pt had 6 beats of VT. Pt had concerns about code status. Notified provider.
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
## 2021-09-07 NOTE — Progress Notes (Incomplete)
ANTICOAGULATION CONSULT NOTE   Pharmacy Consult for Heparin Indication: atrial fibrillation  Allergies  Allergen Reactions   Mushroom Extract Complex Nausea And Vomiting   Neosporin [Neomycin-Bacitracin Zn-Polymyx] Hives    Patient Measurements: Height: 5\' 9"  (175.3 cm) Weight: 78.2 kg (172 lb 6.4 oz) IBW/kg (Calculated) : 70.7 Heparin Dosing Weight: 81.6 kg  Vital Signs: Temp: 97.6 F (36.4 C) (12/18 0737) Temp Source: Oral (12/18 0737) BP: 106/82 (12/18 0737) Pulse Rate: 87 (12/18 0737)  Labs: Recent Labs    09/04/21 1443 09/06/21 1116 09/06/21 1242 09/06/21 2039 09/06/21 2047 09/07/21 0347  HGB  --   --  15.4  --   --   --   HCT  --   --  47.5  --   --   --   PLT  --   --  330  --   --   --   APTT  --   --   --  38*  --  75*  LABPROT  --   --   --  22.5*  --   --   INR  --   --   --  2.0*  --   --   HEPARINUNFRC  --   --   --   --  >1.10* >1.10*  CREATININE 2.32*  --  1.95*  --   --  1.99*  TROPONINIHS  --  27*  --   --   --   --      Estimated Creatinine Clearance: 35 mL/min (A) (by C-G formula based on SCr of 1.99 mg/dL (H)).   Medical History: Past Medical History:  Diagnosis Date   Arrhythmia    atrial fibrillation   CHF (congestive heart failure) (HCC)    Chronic kidney disease    Coronary artery disease    Hyperlipidemia    Hypertension    Myocardial infarct (HCC)     Medications: see MAR  Assessment: 72 YOM presented with weakness, near syncope, and poor oral intake. Heparin per pharmacy consult placed for atrial fibrillation in preparation for RHC on Monday 12/19. On PTA apixaban, last dose 12/17 04:00. Will monitor aPTT while anti-Xa falsely elevated due to DOAC DDI.  aPTT (***) ***therapeutic on 1200 units/hr. CBC stable.   12/18 AM update:  aPTT therapeutic   Goal of Therapy:  Heparin level 0.3-0.7 units/ml aPTT 66-102 seconds Monitor platelets by anticoagulation protocol: Yes   Plan:  Cont heparin infusion at 1200  units/hr Check aPTT & anti-Xa level in 6 hours Daily CBC, heparin level, and aPTT until correlating  F/U transition to PO anticoagulation after cath  Laurey Arrow, PharmD PGY1 Pharmacy Resident 09/07/2021  9:25 AM  Please check [email or web address] for unit-specific pharmacy phone numbers.

## 2021-09-07 NOTE — Evaluation (Signed)
Physical Therapy Evaluation Patient Details Name: Isaac [name] MRN: [id number] DOB: 03-06-1951 Today's Date: 09/07/2021  History of Present Illness  Isaac [name] is a 70 y.o. male came with repeated near syncope, nausea poor intake and generalized weakness;  with medical history significant of advanced stage of systolic CHF chronic LVEF less than 20%, status post AICD/PPM, PAF on Eliquis, CKD stage IIIb, HTN, HLD  Clinical Impression   Pt admitted with above diagnosis. Comes from home where he lives with his wife in a two story home (bed, bath upstairs); Independent at baseline, uses a cane prn for amb; Noted has likely been in afib since October; Presents to PT with generalized fatigue, but participating well; Orthostatic BPs soft, but did not demonstrate significant postural hypotension;  Pt currently with functional limitations due to the deficits listed below (see PT Problem List). Pt will benefit from skilled PT to increase their independence and safety with mobility to allow discharge to the venue listed below.          Recommendations for follow up therapy are one component of a multi-disciplinary discharge planning process, led by the attending physician.  Recommendations may be updated based on patient status, additional functional criteria and insurance authorization.  Follow Up Recommendations Other (comment) (Cardiac Rehab Phase 2 as outpt; Pt voiced conern that he mgiht not be able to handle going straight to outpt Cardiac Rehab -- as we get closer to dc, we can consider HHPT if he makes slower progress than anticipated)    Assistance Recommended at Discharge PRN  Functional Status Assessment Patient has had a recent decline in their functional status and demonstrates the ability to make significant improvements in function in a reasonable and predictable amount of time.  Proofreader (4 wheels)    Recommendations for Other Services Other  (comment) ([facility])     Precautions / Restrictions Precautions Precautions: Fall Precaution Comments: watch vitals, and for pre-syncopal symptoms Restrictions Weight Bearing Restrictions: No      Mobility  Bed Mobility Overal bed mobility: Modified Independent                  Transfers Overall transfer level: Needs assistance Equipment used: None Transfers: Sit to/from Stand Sit to Stand: Supervision           General transfer comment: Cues to self-monitor for qactivity tolerance    Ambulation/Gait Ambulation/Gait assistance: Supervision Gait Distance (Feet):  (March in place) Assistive device: None         General Gait Details: Mildly unsteady; Pt very fatigued and requested to get back to bed after gettign orthostatic BPs; agreed to march in place  Stairs            Wheelchair Mobility    Modified Rankin (Stroke Patients Only)       Balance Overall balance assessment: Needs assistance Sitting-balance support: No upper extremity supported;Feet supported Sitting balance-Leahy Scale: Good     Standing balance support: No upper extremity supported;During functional activity Standing balance-Leahy Scale: Fair                               Pertinent Vitals/Pain Pain Assessment: No/denies pain    Home Living Family/patient expects to be discharged to:: Private residence Living Arrangements: Spouse/significant other Available Help at Discharge: Available 24 hours/day;Family Type of Home: House Home Access: Stairs to enter   [employer] of Steps: 1 Alternate Level Stairs-Number of  Steps: flight Home Layout: Bed/bath upstairs;Two level Home Equipment: Cane - single point      Prior Function Prior Level of Function : Independent/Modified Independent;Driving             Mobility Comments: sometimes uses SPC, must take mutliple breaks ADLs Comments: generaly Mod I oer wife; must take a lot of  breaks due to fatigue. Wife assists with IADLs ad needed     Hand Dominance   Dominant Hand: Right    Extremity/Trunk Assessment   Upper Extremity Assessment Upper Extremity Assessment: Defer to OT evaluation    Lower Extremity Assessment Lower Extremity Assessment: Generalized weakness    Cervical / Trunk Assessment Cervical / Trunk Assessment: Normal  Communication   Communication: No difficulties  Cognition Arousal/Alertness: Awake/alert Behavior During Therapy: WFL for tasks assessed/performed Overall Cognitive Status: Within Functional Limits for tasks assessed                                          General Comments General comments (skin integrity, edema, etc.):   09/07/21 1113  Vital Signs  Patient Position (if appropriate) Orthostatic Vitals  Orthostatic Lying   BP- Lying 104/82 (MAP 91)  Pulse- Lying 94  Orthostatic Sitting  BP- Sitting 96/79 (MAP 83)  Pulse- Sitting 96  Orthostatic Standing at 0 minutes  BP- Standing at 0 minutes 97/80 (Map 87)  Pulse- Standing at 0 minutes 85  Orthostatic Standing at 3 minutes  BP- Standing at 3 minutes 100/70 (Map 81)  Pulse- Standing at 3 minutes 102       Exercises     Assessment/Plan    PT Assessment Patient needs continued PT services  PT Problem List Decreased strength;Decreased activity tolerance;Cardiopulmonary status limiting activity;Decreased knowledge of use of DME       PT Treatment Interventions DME instruction;Gait training;Stair training;Functional mobility training;Therapeutic activities;Therapeutic exercise;Balance training;Neuromuscular re-education;Cognitive remediation;Patient/family education    PT Goals (Current goals can be found in the Care Plan section)  Acute Rehab PT Goals Patient Stated Goal: Home soon PT Goal Formulation: With patient Time For Goal Achievement: 09/21/21 Potential to Achieve Goals: Good    Frequency Min 3X/week   Barriers to discharge         Co-evaluation               AM-PAC PT "6 Clicks" Mobility  Outcome Measure Help needed turning from your back to your side while in a flat bed without using bedrails?: None Help needed moving from lying on your back to sitting on the side of a flat bed without using bedrails?: None Help needed moving to and from a bed to a chair (including a wheelchair)?: None Help needed standing up from a chair using your arms (e.g., wheelchair or bedside chair)?: A Little Help needed to walk in hospital room?: A Little Help needed climbing 3-5 steps with a railing? : A Little 6 Click Score: 21    End of Session   Activity Tolerance: Patient tolerated treatment well Patient left: in bed;with call bell/phone within reach;with family/visitor present Nurse Communication: Mobility status;Other (comment) (and Orthostatic BPs documented in vitals flowsheets) PT Visit Diagnosis: Unsteadiness on feet (R26.81);Other abnormalities of gait and mobility (R26.89)    Time: 3546-5681 PT Time Calculation (min) (ACUTE ONLY): 35 min   Charges:   PT Evaluation $PT Eval Low Complexity: 1 Low PT Treatments $Therapeutic Activity: 8-22 mins  Roney Marion, [place]  Acute Rehabilitation Services Pager [phone] Office [place] 09/07/2021, 1:53 PM

## 2021-09-07 NOTE — Progress Notes (Signed)
ANTICOAGULATION CONSULT NOTE   Pharmacy Consult for Heparin Indication: atrial fibrillation  Allergies  Allergen Reactions   Mushroom Extract Complex Nausea And Vomiting   Neosporin [Neomycin-Bacitracin Zn-Polymyx] Hives    Patient Measurements:   Heparin Dosing Weight: 81.6 kg  Vital Signs: Temp: 97.5 F (36.4 C) (12/18 0419) Temp Source: Oral (12/18 0419) BP: 102/75 (12/18 0419) Pulse Rate: 82 (12/18 0419)  Labs: Recent Labs    09/04/21 1443 09/06/21 1116 09/06/21 1242 09/06/21 2039 09/06/21 2047 09/07/21 0347  HGB  --   --  15.4  --   --   --   HCT  --   --  47.5  --   --   --   PLT  --   --  330  --   --   --   APTT  --   --   --  38*  --  75*  LABPROT  --   --   --  22.5*  --   --   INR  --   --   --  2.0*  --   --   HEPARINUNFRC  --   --   --   --  >1.10* >1.10*  CREATININE 2.32*  --  1.95*  --   --  1.99*  TROPONINIHS  --  27*  --   --   --   --      Estimated Creatinine Clearance: 35 mL/min (A) (by C-G formula based on SCr of 1.99 mg/dL (H)).   Medical History: Past Medical History:  Diagnosis Date   Arrhythmia    atrial fibrillation   CHF (congestive heart failure) (HCC)    Chronic kidney disease    Coronary artery disease    Hyperlipidemia    Hypertension    Myocardial infarct (HCC)     Medications:  Medications Prior to Admission  Medication Sig Dispense Refill Last Dose   albuterol (VENTOLIN HFA) 108 (90 Base) MCG/ACT inhaler Inhale 2 puffs into the lungs every 6 (six) hours as needed.   unknown   apixaban (ELIQUIS) 5 MG TABS tablet Take 1 tablet (5 mg total) by mouth 2 (two) times daily. 60 tablet 2 09/06/2021 at 0500   furosemide (LASIX) 20 MG tablet Take 1 tablet (20 mg total) by mouth daily as needed for edema or fluid (sob). (Patient taking differently: Take 20 mg by mouth daily.) 90 tablet 0 Past Week   midodrine (PROAMATINE) 2.5 MG tablet Take 1 tablet (2.5 mg total) by mouth 3 (three) times daily with meals. 90 tablet 2 09/06/2021    amiodarone (PACERONE) 200 MG tablet Take 1 tablet (200 mg total) by mouth 2 (two) times daily. (Patient not taking: Reported on 08/29/2021) 60 tablet 2 Not Taking   metoprolol tartrate (LOPRESSOR) 25 MG tablet Take 0.5 tablets (12.5 mg total) by mouth 2 (two) times daily. (Patient not taking: Reported on 08/29/2021) 30 tablet 2 Not Taking   montelukast (SINGULAIR) 10 MG tablet Take by mouth. (Patient not taking: Reported on 09/06/2021)   Not Taking   spironolactone (ALDACTONE) 25 MG tablet Take 0.5 tablets (12.5 mg total) by mouth daily. (Patient not taking: Reported on 08/29/2021) 15 tablet 2 Not Taking   tamsulosin (FLOMAX) 0.4 MG CAPS capsule TAKE 1 CAPSULE (0.4 MG TOTAL) BY MOUTH DAILY AFTER BREAKFAST. (Patient not taking: Reported on 08/29/2021) 30 capsule 0 Not Taking    Scheduled:   furosemide  40 mg Intravenous BID   midodrine  2.5 mg Oral TID  WC   montelukast  5 mg Oral QHS   sodium chloride flush  3 mL Intravenous Q12H   Infusions:   sodium chloride     heparin 1,200 Units/hr (09/06/21 2135)   PRN: sodium chloride, acetaminophen, albuterol, ondansetron (ZOFRAN) IV, sodium chloride flush  Assessment: 26 yom with a history of stage of systolic CHF chronic LVEF less than 20%, status post AICD/PPM, PAF on Eliquis, CKD stage IIIb, HTN, HLD. Patient is presenting with weakness, near syncope, and poor oral intake. Heparin per pharmacy consult placed for atrial fibrillation in preparation for RHC on Monday.  Patient is on apixaban prior to arrival. Last dose ~4am on 12/17. Will require aPTT monitoring due to likely falsely high anti-Xa level secondary to DOAC use.  Hgb 15.4; plt 330  12/18 AM update:  aPTT therapeutic   Goal of Therapy:  Heparin level 0.3-0.7 units/ml aPTT 66-102 seconds Monitor platelets by anticoagulation protocol: Yes   Plan:  Cont heparin infusion at 1200 units/hr Check aPTT & anti-Xa level in 8 hours and daily while on heparin Continue to monitor via aPTT  until levels are correlated Continue to monitor H&H and platelets  Narda Bonds, PharmD, BCPS Clinical Pharmacist Phone: [phone]

## 2021-09-07 NOTE — Evaluation (Signed)
Occupational Therapy Evaluation Patient Details Name: Isaac [name] MRN: [id number] DOB: [birth year] Today's Date: 09/07/2021   History of Present Illness Isaac [name] is a 70 y.o. male came with repeated near syncope, nausea poor intake and generalized weakness;  with medical history significant of advanced stage of systolic CHF chronic LVEF less than 20%, status post AICD/PPM, PAF on Eliquis, CKD stage IIIb, HTN, HLD   Clinical Impression   Isaac [name] was generally mod I PTA, with slow decline in function over the past 6 months. He lives in a 2 level home. 1 STE with his wife who can assist as needed. Upon evaluation pt is not limited by poor activity tolerance, generalized fatigue and weakness with 1 mild LOB. He is now requiring close min guard for functional mobility without AD and ADLs. Pt would benefit from continued OT acutely to progress activity tolerance. Recommend d/c home with supervision initially for ambulation and ADLs.    Recommendations for follow up therapy are one component of a multi-disciplinary discharge planning process, led by the attending physician.  Recommendations may be updated based on patient status, additional functional criteria and insurance authorization.   Follow Up Recommendations  No OT follow up    Assistance Recommended at Discharge Frequent or constant Supervision/Assistance  Functional Status Assessment  Patient has had a recent decline in their functional status and demonstrates the ability to make significant improvements in function in a reasonable and predictable amount of time.  Equipment Recommendations  Tub/shower seat    Recommendations for Other Services       Precautions / Restrictions Precautions Precautions: Fall Precaution Comments: watch vitals Restrictions Weight Bearing Restrictions: No      Mobility Bed Mobility Overal bed mobility: Modified Independent                  Transfers Overall transfer level:  Needs assistance Equipment used: None Transfers: Sit to/from Stand Sit to Stand: Supervision           General transfer comment: no AD      Balance Overall balance assessment: Needs assistance Sitting-balance support: No upper extremity supported;Feet supported Sitting balance-Leahy Scale: Good     Standing balance support: No upper extremity supported;During functional activity Standing balance-Leahy Scale: Fair                             ADL either performed or assessed with clinical judgement   ADL Overall ADL's : Needs assistance/impaired Eating/Feeding: Independent;Sitting   Grooming: Min guard;Standing   Upper Body Bathing: Set up;Sitting   Lower Body Bathing: Min guard;Sit to/from stand   Upper Body Dressing : Supervision/safety;Sitting   Lower Body Dressing: Min guard;Sit to/from stand   Toilet Transfer: Min guard;Ambulation Toilet Transfer Details (indicate cue type and reason): one mild LOB, ablet o self correct Toileting- Clothing Manipulation and Hygiene: Supervision/safety;Sitting/lateral lean       Functional mobility during ADLs: Min guard General ADL Comments: min guard for all OOB/standing tasks due to poor activity tolerance and general fatigue. Pt had one mild LOB when ambulating tot he toilet. Educated on use of AD for incrased safety     Vision Baseline Vision/History: 1 Wears glasses Ability to See in Adequate Light: 0 Adequate Patient Visual Report: No change from baseline Vision Assessment?: No apparent visual deficits     Perception     Praxis      Pertinent Vitals/Pain Pain Assessment: No/denies pain  Hand Dominance Right   Extremity/Trunk Assessment Upper Extremity Assessment Upper Extremity Assessment: Overall WFL for tasks assessed   Lower Extremity Assessment Lower Extremity Assessment: Defer to PT evaluation   Cervical / Trunk Assessment Cervical / Trunk Assessment: Normal   Communication  Communication Communication: No difficulties   Cognition Arousal/Alertness: Awake/alert Behavior During Therapy: Flat affect Overall Cognitive Status: Within Functional Limits for tasks assessed                                       General Comments  VS on RA, HR to 111 when toileting    Exercises     Shoulder Instructions      Home Living Family/patient expects to be discharged to:: Private residence Living Arrangements: Spouse/significant other Available Help at Discharge: Available 24 hours/day;Family Type of Home: House Home Access: Stairs to enter Technical brewer of Steps: 1   Home Layout: Bed/bath upstairs     Bathroom Shower/Tub: Occupational psychologist: Standard     Home Equipment: Cane - single point          Prior Functioning/Environment Prior Level of Function : Independent/Modified Independent;Driving             Mobility Comments: sometimes uses SPC, must take mutliple breaks ADLs Comments: generaly Mod I oer wife; must take a lot of breaks due to fatigue. Wife assists with IADLs ad needed        OT Problem List: Decreased strength;Decreased activity tolerance;Impaired balance (sitting and/or standing);Decreased safety awareness;Decreased knowledge of use of DME or AE;Pain      OT Treatment/Interventions: Self-care/ADL training;Therapeutic exercise;DME and/or AE instruction;Therapeutic activities;Patient/family education;Balance training    OT Goals(Current goals can be found in the care plan section) Acute Rehab OT Goals Patient Stated Goal: home asap OT Goal Formulation: With patient Time For Goal Achievement: 09/21/21 Potential to Achieve Goals: Good ADL Goals Pt Will Perform Lower Body Bathing: with modified independence;sit to/from stand Pt Will Perform Lower Body Dressing: with modified independence;sit to/from stand Pt Will Transfer to Toilet: with modified independence;ambulating Additional ADL Goal  #1: Pt will tolerate at least 8 minutes of OOB functional task without reast break to incrased activity tolerance Additional ADL Goal #2: pt will indep verbalize at least 2 energy conservation strategies to apply at d/c.  OT Frequency: Min 2X/week   Barriers to D/C:            Co-evaluation              AM-PAC OT "6 Clicks" Daily Activity     Outcome Measure Help from another person eating meals?: None Help from another person taking care of personal grooming?: A Little Help from another person toileting, which includes using toliet, bedpan, or urinal?: A Little Help from another person bathing (including washing, rinsing, drying)?: A Little Help from another person to put on and taking off regular upper body clothing?: A Little Help from another person to put on and taking off regular lower body clothing?: A Little 6 Click Score: 19   End of Session Equipment Utilized During Treatment: Gait belt Nurse Communication: Mobility status  Activity Tolerance: Patient tolerated treatment well Patient left: in bed;with call bell/phone within reach;with bed alarm set;with family/visitor present  OT Visit Diagnosis: Unsteadiness on feet (R26.81);Other abnormalities of gait and mobility (R26.89);Muscle weakness (generalized) (M62.81)  Time: 4665-9935 OT Time Calculation (min): 17 min Charges:  OT General Charges $OT Visit: 1 Visit OT Evaluation $OT Eval Moderate Complexity: 1 Mod   Bayley Yarborough A Aneya Daddona 09/07/2021, 11:08 AM

## 2021-09-07 NOTE — Consult Note (Signed)
Advanced Heart Failure Team Consult Note   Primary Physician: [name], MD PCP-Cardiologist:  None  Reason for Consultation: CHF  HPI:    Isaac [name] is seen today for evaluation of CHF at the request of Dr. Gasper Sells.   70 y.o. with history of CAD, HTN, atrial fibrillation, and chronic systolic CHF was admitted for recurrent CHF exacerbation and syncope.  Patient has had a long-standing cardiomyopathy, EF has been in the 25% range for years.  He had PCI to OM1 in 2007 and RCA in 2013.  From his report, these episodes do not sound like ACS events.  Echo this admission shows EF < 20%, severe LV dilation, restrictive diastolic function, moderate RV dysfunction, moderate MR, mod-severe TR. HF has been complicated by CKD stage 3. He is also noted on device interrogation to have been in atrial fibrillation persistently since 10/22.  Cardioversion has not been attempted.  He was put on amiodarone po transiently but was not taking at the time of admission.   He was admitted at [facility] in 12/22.  BP was low, his BP-active meds were stopped and midodrine was begun.  He was diuresed and discharged home.  He says that he has steadily worsened since discharge with dyspnea walking around the house and worsening orthopnea.  Currently sitting on the side of the bed.  He was admitted after he stood up then passed out at home.  Device interrogation showed run of VT treated x 2 with ATP possibly around the time of syncope.  NSVT has been noted in the hospital.  Creatinine today is 1.99 which is fairly stable.  He looks uncomfortable with dyspnea and orthopnea.  No chest pain.  HS-TnI minimally elevated with no trend.   PMH: 1. HTN 2. Hyperlipidemia 3. CAD: H/o PCI to [place] 2007 and PCI to RCA in 2013.  Uncertain of circumstances but do not sound like ACS events.  4. CKD stage 3 5. Atrial fibrillation: Persistent 6. Chronic systolic CHF: Long-standing cardiomyopathy with EF in the 25% range.   Cause uncertain, has history of PCI to RCA and OM1, but degree of CAD does not seem to explain severity of cardiomyopathy. Father had history of CAD/CHF.  Not heavy drinker. Has [employer] ICD.  - Echo (12/22): EF < 20%, severe LV dilation, restrictive diastolic function, moderate RV dysfunction, moderate MR, mod-severe TR.   Review of Systems: All systems reviewed and negative except as per HPI.   Home Medications Prior to Admission medications   Medication Sig Start Date End Date Taking? Authorizing Provider  albuterol (VENTOLIN HFA) 108 (90 Base) MCG/ACT inhaler Inhale 2 puffs into the lungs every 6 (six) hours as needed. 05/01/21  Yes [provider]  apixaban (ELIQUIS) 5 MG TABS tablet Take 1 tablet (5 mg total) by mouth 2 (two) times daily. 08/24/21 11/22/21 Yes Dahal, Marlowe Aschoff, MD  furosemide (LASIX) 20 MG tablet Take 1 tablet (20 mg total) by mouth daily as needed for edema or fluid (sob). Patient taking differently: Take 20 mg by mouth daily. 08/24/21 11/22/21 Yes Dahal, Marlowe Aschoff, MD  midodrine (PROAMATINE) 2.5 MG tablet Take 1 tablet (2.5 mg total) by mouth 3 (three) times daily with meals. 08/24/21 11/22/21 Yes Dahal, Marlowe Aschoff, MD  amiodarone (PACERONE) 200 MG tablet Take 1 tablet (200 mg total) by mouth 2 (two) times daily. Patient not taking: Reported on 08/29/2021 08/24/21 11/22/21  Terrilee Croak, MD  metoprolol tartrate (LOPRESSOR) 25 MG tablet Take 0.5 tablets (12.5 mg total) by  mouth 2 (two) times daily. Patient not taking: Reported on 08/29/2021 08/24/21 11/22/21  Terrilee Croak, MD  montelukast (SINGULAIR) 10 MG tablet Take by mouth. Patient not taking: Reported on 09/06/2021 10/23/19 10/22/20  [provider]  spironolactone (ALDACTONE) 25 MG tablet Take 0.5 tablets (12.5 mg total) by mouth daily. Patient not taking: Reported on 08/29/2021 08/24/21 11/22/21  Terrilee Croak, MD  tamsulosin (FLOMAX) 0.4 MG CAPS capsule TAKE 1 CAPSULE (0.4 MG TOTAL) BY MOUTH DAILY AFTER  BREAKFAST. Patient not taking: Reported on 08/29/2021 04/24/20   Laneta Simmers     Past Surgical History: Past Surgical History:  Procedure Laterality Date   CARDIAC DEFIBRILLATOR PLACEMENT  feb 2014    Family History: No family history on file.  Social History: Social History   Socioeconomic History   Marital status: Married    Spouse name: Not on file   Number of children: Not on file   Years of education: Not on file   Highest education level: Not on file  Occupational History   Not on file  Tobacco Use   Smoking status: Former   Smokeless tobacco: Never  Substance and Sexual Activity   Alcohol use: No   Drug use: No   Sexual activity: Not on file  Other Topics Concern   Not on file  Social History Narrative   Not on file   Social Determinants of Health   Financial Resource Strain: Not on file  Food Insecurity: Not on file  Transportation Needs: Not on file  Physical Activity: Not on file  Stress: Not on file  Social Connections: Not on file    Allergies:  Allergies  Allergen Reactions   Mushroom Extract Complex Nausea And Vomiting   Neosporin [Neomycin-Bacitracin Zn-Polymyx] Hives    Objective:    Vital Signs:   Temp:  [97.2 F (36.2 C)-97.6 F (36.4 C)] 97.2 F (36.2 C) (12/18 1127) Pulse Rate:  [46-111] 92 (12/18 1127) Resp:  [0-29] 19 (12/18 1127) BP: (94-112)/(75-95) 107/79 (12/18 1223) SpO2:  [92 %-100 %] 98 % (12/18 1127) Weight:  [78.2 kg] 78.2 kg (12/18 0419)    Weight change: Filed Weights   09/07/21 0419  Weight: 78.2 kg    Intake/Output:   Intake/Output Summary (Last 24 hours) at 09/07/2021 1244 Last data filed at 09/07/2021 0600 Gross per 24 hour  Intake 100.57 ml  Output 125 ml  Net -24.43 ml      Physical Exam    General:  Orthopneic, uncomfortable.  HEENT: normal Neck: supple. JVP 16+. Carotids 2+ bilat; no bruits. No lymphadenopathy or thyromegaly appreciated. Cor: PMI nondisplaced. Irregular rate &  rhythm. Distant heart sounds.  Lungs:Decreased at bases.  Abdomen: soft, nontender, nondistended. No hepatosplenomegaly. No bruits or masses. Good bowel sounds. Extremities: no cyanosis, clubbing, rash. 1+ ankle edema.  Neuro: alert & orientedx3, cranial nerves grossly intact. moves all 4 extremities w/o difficulty. Affect pleasant   Telemetry   Atrial fibrillation with controlled rate, occasional V-pacing (personally reviewed)  EKG    Atrial fibrillation with RV pacing (personally reviewed)  Labs   Basic Metabolic Panel: Recent Labs  Lab 09/04/21 1443 09/06/21 1242 09/07/21 0347  NA 135 136 136  K 4.3 4.1 3.8  CL 103 103 102  CO2 22 25 24   GLUCOSE 91 94 90  BUN 46* 40* 40*  CREATININE 2.32* 1.95* 1.99*  CALCIUM 9.4 9.2 9.1  MG  --  2.2  --   PHOS  --  3.8  --  Liver Function Tests: Recent Labs  Lab 09/06/21 1242  AST 34  ALT 32  ALKPHOS 114  BILITOT 2.8*  PROT 7.1  ALBUMIN 3.4*   No results for input(s): LIPASE, AMYLASE in the last 168 hours. No results for input(s): AMMONIA in the last 168 hours.  CBC: Recent Labs  Lab 09/06/21 1242 09/07/21 1140  WBC 6.7 6.2  NEUTROABS 1.5*  --   HGB 15.4 15.1  HCT 47.5 45.4  MCV 94.1 92.3  PLT 330 326    Cardiac Enzymes: No results for input(s): CKTOTAL, CKMB, CKMBINDEX, TROPONINI in the last 168 hours.  BNP: BNP (last 3 results) Recent Labs    08/22/21 0510 09/04/21 1443 09/06/21 1243  BNP 1,593.4* 1,644.2* 1,614.8*    ProBNP (last 3 results) No results for input(s): PROBNP in the last 8760 hours.   CBG: No results for input(s): GLUCAP in the last 168 hours.  Coagulation Studies: Recent Labs    09/06/21 2039  LABPROT 22.5*  INR 2.0*     Imaging   [place] EKG SITE RITE  Result Date: 09/07/2021 If Site Rite image not attached, placement could not be confirmed due to current cardiac rhythm.    Medications:     Current Medications:  amiodarone  150 mg Intravenous Once   furosemide   80 mg Intravenous Once   midodrine  2.5 mg Oral TID WC   montelukast  5 mg Oral QHS   potassium chloride  20 mEq Oral Daily   potassium chloride  40 mEq Oral Once   sodium chloride flush  3 mL Intravenous Q12H   sodium chloride flush  3 mL Intravenous Q12H    Infusions:  sodium chloride     amiodarone     Followed by   amiodarone     furosemide (LASIX) 200 mg in dextrose 5% 100 mL (2mg /mL) infusion     heparin 1,200 Units/hr (09/06/21 2135)   milrinone        Assessment/Plan   1. Acute on chronic systolic CHF:  Long-standing cardiomyopathy.  [place].  Echo this admission with EF < 20%, severe LV dilation, restrictive diastolic function, moderate RV dysfunction, moderate MR, mod-severe TR. Cause of cardiomyopathy is uncertain.  He has a history of CAD, but I do not think that the described CAD from the past could explain his cardiomyopathy, but CAD could have progressed.  With difficulty tolerating GDMT/need for midodrine and cardiorenal syndrome as well as profound volume overload, I am concerned for low output HF.  He is currently very short of breath, worse since he got a dose of metoprolol this morning.  He has not had diuretic today.   - Place PICC urgently, follow CVP and co-ox.   - I will send him to CCU.  - Lasix 80 mg IV x 1 now then Lasix gtt 12 mg/hr.  - Continue home midodrine.  - After initial co-ox drawn, start milrinone 0.25 mcg/kg/min.  - Needs extensive diuresis, will aim for LHC/RHC after better diuresis as long as creatinine remains stable/improves.  - Will need to try to get him back into NSR (see below).  - He is currently RV pacing frequently which is not ideal, will need to see if this is improved by regaining NSR.  - I worry about his trajectory, we briefly discussed that he may need an LVAD in the future.  Possible candidate if renal function improves/stabilizes.  RV looks moderately dysfunctional on my assessment of echo.  2. Atrial  fibrillation: Persistent since 10/22 based on device interrogation.  Would benefit from resumption of NSR.  - Start amiodarone gtt.  - He is on heparin gtt.  - Will aim to diurese then try to wean milrinone and cardiovert.  3. CKD stage 3: Creatinine 1.99 today, suspect cardiorenal syndrome.  Follow closely with diuresis.  4. Syncope: Prior to admission, stood up then passed out.  ?Orthostatic versus arrhythmic.  He had VT run terminated by ATP that may have been around the time of syncope.   - Starting amiodarone gtt.  5. Elevated TSH/elevated free T4: Mixed picture, ?sick euthyroid.  Will send free T3.  6. CAD: History of PCI to OM1 in 2007 and RCA in 2013.  No recent chest pain.  - Would aim for coronary angiography with RHC once he is diuresed.  - Add statin.   Length of Stay: 1  Loralie Champagne, MD  09/07/2021, 12:44 PM  Advanced Heart Failure Team Pager [phone] (M-F; 7a - 5p)  Please contact [facility] for night-coverage after hours (4p -7a ) and weekends on [email or web address]

## 2021-09-07 NOTE — Plan of Care (Signed)
Problem: Education: °Goal: Knowledge of General Education information will improve °Description: Including pain rating scale, medication(s)/side effects and non-pharmacologic comfort measures °Outcome: Completed/Met °  °

## 2021-09-08 ENCOUNTER — Encounter (HOSPITAL_COMMUNITY): Payer: Self-pay | Admitting: Cardiology

## 2021-09-08 ENCOUNTER — Encounter (HOSPITAL_COMMUNITY)
Admission: EM | Disposition: A | Discharge status: Home Health | Payer: Self-pay | Source: Home / Self Care | Attending: Surgery

## 2021-09-08 HISTORY — PX: RIGHT HEART CATH: CATH118263

## 2021-09-08 LAB — POCT I-STAT EG7
Acid-Base Excess: 1 mmol/L (ref 0.0–2.0)
Acid-Base Excess: 2 mmol/L (ref 0.0–2.0)
Bicarbonate: 25.5 mmol/L (ref 20.0–28.0)
Bicarbonate: 26.2 mmol/L (ref 20.0–28.0)
Calcium, Ion: 1.18 mmol/L (ref 1.15–1.40)
Calcium, Ion: 1.2 mmol/L (ref 1.15–1.40)
HCT: 43 % (ref 39.0–52.0)
HCT: 43 % (ref 39.0–52.0)
Hemoglobin: 14.6 g/dL (ref 13.0–17.0)
Hemoglobin: 14.6 g/dL (ref 13.0–17.0)
O2 Saturation: 73 %
O2 Saturation: 74 %
Potassium: 4 mmol/L (ref 3.5–5.1)
Potassium: 4 mmol/L (ref 3.5–5.1)
Sodium: 136 mmol/L (ref 135–145)
Sodium: 136 mmol/L (ref 135–145)
TCO2: 27 mmol/L (ref 22–32)
TCO2: 27 mmol/L (ref 22–32)
pCO2, Ven: 38.1 mmHg — ABNORMAL LOW (ref 44.0–60.0)
pCO2, Ven: 39 mmHg — ABNORMAL LOW (ref 44.0–60.0)
pH, Ven: 7.434 — ABNORMAL HIGH (ref 7.250–7.430)
pH, Ven: 7.436 — ABNORMAL HIGH (ref 7.250–7.430)
pO2, Ven: 37 mmHg (ref 32.0–45.0)
pO2, Ven: 38 mmHg (ref 32.0–45.0)

## 2021-09-08 LAB — COMPREHENSIVE METABOLIC PANEL
ALT: 28 U/L (ref 0–44)
AST: 33 U/L (ref 15–41)
Albumin: 2.8 g/dL — ABNORMAL LOW (ref 3.5–5.0)
Alkaline Phosphatase: 93 U/L (ref 38–126)
Anion gap: 11 (ref 5–15)
BUN: 41 mg/dL — ABNORMAL HIGH (ref 8–23)
CO2: 21 mmol/L — ABNORMAL LOW (ref 22–32)
Calcium: 8.5 mg/dL — ABNORMAL LOW (ref 8.9–10.3)
Chloride: 98 mmol/L (ref 98–111)
Creatinine, Ser: 2.2 mg/dL — ABNORMAL HIGH (ref 0.61–1.24)
GFR, Estimated: 32 mL/min — ABNORMAL LOW (ref >60)
Glucose, Bld: 213 mg/dL — ABNORMAL HIGH (ref 70–99)
Potassium: 4.3 mmol/L (ref 3.5–5.1)
Sodium: 130 mmol/L — ABNORMAL LOW (ref 135–145)
Total Bilirubin: 2.5 mg/dL — ABNORMAL HIGH (ref 0.3–1.2)
Total Protein: 5.7 g/dL — ABNORMAL LOW (ref 6.5–8.1)

## 2021-09-08 LAB — BASIC METABOLIC PANEL
Anion gap: 11 (ref 5–15)
BUN: 40 mg/dL — ABNORMAL HIGH (ref 8–23)
CO2: 24 mmol/L (ref 22–32)
Calcium: 8.9 mg/dL (ref 8.9–10.3)
Chloride: 99 mmol/L (ref 98–111)
Creatinine, Ser: 2.21 mg/dL — ABNORMAL HIGH (ref 0.61–1.24)
GFR, Estimated: 31 mL/min — ABNORMAL LOW (ref >60)
Glucose, Bld: 108 mg/dL — ABNORMAL HIGH (ref 70–99)
Potassium: 4 mmol/L (ref 3.5–5.1)
Sodium: 134 mmol/L — ABNORMAL LOW (ref 135–145)

## 2021-09-08 LAB — COOXEMETRY PANEL
Carboxyhemoglobin: 1.1 % (ref 0.5–1.5)
Carboxyhemoglobin: 1.1 % (ref 0.5–1.5)
Methemoglobin: 0.7 % (ref 0.0–1.5)
Methemoglobin: 0.7 % (ref 0.0–1.5)
O2 Saturation: 63.7 %
O2 Saturation: 59.5 %
Total hemoglobin: 13.4 g/dL (ref 12.0–16.0)
Total hemoglobin: 13.2 g/dL (ref 12.0–16.0)

## 2021-09-08 LAB — CBC
HCT: 38.8 % — ABNORMAL LOW (ref 39.0–52.0)
Hemoglobin: 12.9 g/dL — ABNORMAL LOW (ref 13.0–17.0)
MCH: 30.6 pg (ref 26.0–34.0)
MCHC: 33.2 g/dL (ref 30.0–36.0)
MCV: 92.2 fL (ref 80.0–100.0)
Platelets: 275 10*3/uL (ref 150–400)
RBC: 4.21 MIL/uL — ABNORMAL LOW (ref 4.22–5.81)
RDW: 16.9 % — ABNORMAL HIGH (ref 11.5–15.5)
WBC: 4.9 10*3/uL (ref 4.0–10.5)
nRBC: 0 % (ref 0.0–0.2)

## 2021-09-08 LAB — LIPID PANEL
Cholesterol: 94 mg/dL (ref 0–200)
HDL: 25 mg/dL — ABNORMAL LOW (ref >40)
LDL Cholesterol: 58 mg/dL (ref 0–99)
Total CHOL/HDL Ratio: 3.8 RATIO
Triglycerides: 54 mg/dL (ref <150)
VLDL: 11 mg/dL (ref 0–40)

## 2021-09-08 LAB — APTT
aPTT: >200 seconds (ref 24–36)
aPTT: 158 seconds — ABNORMAL HIGH (ref 24–36)
aPTT: 41 seconds — ABNORMAL HIGH (ref 24–36)

## 2021-09-08 LAB — T4, FREE: Free T4: 3.87 ng/dL — ABNORMAL HIGH (ref 0.61–1.12)

## 2021-09-08 LAB — HEPARIN LEVEL (UNFRACTIONATED)
Heparin Unfractionated: >1.1 IU/mL — ABNORMAL HIGH (ref 0.30–0.70)
Heparin Unfractionated: >1.1 IU/mL — ABNORMAL HIGH (ref 0.30–0.70)
Heparin Unfractionated: 1.01 IU/mL — ABNORMAL HIGH (ref 0.30–0.70)

## 2021-09-08 LAB — LACTIC ACID, PLASMA: Lactic Acid, Venous: 1.6 mmol/L (ref 0.5–1.9)

## 2021-09-08 LAB — T3, FREE: T3, Free: 2.4 pg/mL (ref 2.0–4.4)

## 2021-09-08 LAB — T3: T3, Total: 58 ng/dL — ABNORMAL LOW (ref 71–180)

## 2021-09-08 LAB — MAGNESIUM: Magnesium: 2.1 mg/dL (ref 1.7–2.4)

## 2021-09-08 SURGERY — RIGHT HEART CATH
Anesthesia: LOCAL

## 2021-09-08 MED ORDER — ASPIRIN 81 MG PO CHEW: 81.0000 mg | CHEWABLE_TABLET | ORAL | Status: DC

## 2021-09-08 MED ORDER — METOLAZONE 2.5 MG PO TABS
2.5000 mg | ORAL_TABLET | Freq: Once | ORAL | Status: AC
  Administered 2021-09-08: 08:00:00 2.5 mg via ORAL
  Filled 2021-09-08: qty 1

## 2021-09-08 MED ORDER — SODIUM CHLORIDE 0.9 % IV SOLN: INTRAVENOUS | Status: DC

## 2021-09-08 MED ORDER — LIDOCAINE HCL (PF) 1 % IJ SOLN
INTRAMUSCULAR | Status: DC | PRN
  Administered 2021-09-08: 5 mL

## 2021-09-08 MED ORDER — FENTANYL CITRATE (PF) 100 MCG/2ML IJ SOLN
INTRAMUSCULAR | Status: AC
  Filled 2021-09-08: qty 2

## 2021-09-08 MED ORDER — SODIUM CHLORIDE 0.9 % IV SOLN
INTRAVENOUS | Status: AC | PRN
  Administered 2021-09-08 (×2): 10 mL/h via INTRAVENOUS

## 2021-09-08 MED ORDER — MIDAZOLAM HCL 2 MG/2ML IJ SOLN
INTRAMUSCULAR | Status: DC | PRN
  Administered 2021-09-08: 1 mg via INTRAVENOUS

## 2021-09-08 MED ORDER — LIDOCAINE HCL (PF) 1 % IJ SOLN
INTRAMUSCULAR | Status: AC
  Filled 2021-09-08: qty 30

## 2021-09-08 MED ORDER — SODIUM CHLORIDE 0.9 % IV SOLN: 250.0000 mL | INTRAVENOUS | Status: DC | PRN

## 2021-09-08 MED ORDER — FENTANYL CITRATE (PF) 100 MCG/2ML IJ SOLN
INTRAMUSCULAR | Status: DC | PRN
  Administered 2021-09-08: 25 ug via INTRAVENOUS

## 2021-09-08 MED ORDER — SODIUM CHLORIDE 0.9% FLUSH: 3.0000 mL | INTRAVENOUS | Status: DC | PRN

## 2021-09-08 MED ORDER — HEPARIN (PORCINE) 25000 UT/250ML-% IV SOLN
200.0000 [IU]/h | INTRAVENOUS | Status: DC
  Administered 2021-09-08 – 2021-09-10 (×2): 900 [IU]/h via INTRAVENOUS
  Administered 2021-09-11: 15:00:00 650 [IU]/h via INTRAVENOUS
  Filled 2021-09-08 (×3): qty 250

## 2021-09-08 MED ORDER — FUROSEMIDE 10 MG/ML IJ SOLN
120.0000 mg | Freq: Once | INTRAVENOUS | Status: AC
  Administered 2021-09-08: 03:00:00 120 mg via INTRAVENOUS
  Filled 2021-09-08: qty 10

## 2021-09-08 MED ORDER — MIDAZOLAM HCL 2 MG/2ML IJ SOLN
INTRAMUSCULAR | Status: AC
  Filled 2021-09-08: qty 2

## 2021-09-08 MED ORDER — SODIUM CHLORIDE 0.9% FLUSH
3.0000 mL | Freq: Two times a day (BID) | INTRAVENOUS | Status: DC
  Administered 2021-09-08 – 2021-09-16 (×9): 3 mL via INTRAVENOUS

## 2021-09-08 MED ORDER — HEPARIN (PORCINE) IN NACL 1000-0.9 UT/500ML-% IV SOLN
INTRAVENOUS | Status: DC | PRN
  Administered 2021-09-08: 500 mL

## 2021-09-08 SURGICAL SUPPLY — 11 items
CATH SWAN GANZ VIP 7.5F (CATHETERS) ×1 IMPLANT
KIT HEART LEFT (KITS) ×2 IMPLANT
PACK CARDIAC CATHETERIZATION (CUSTOM PROCEDURE TRAY) ×2 IMPLANT
PROTECTION STATION PRESSURIZED (MISCELLANEOUS) ×2
SHEATH PINNACLE 8F 10CM (SHEATH) ×1 IMPLANT
SHEATH PROBE COVER 6X72 (BAG) ×1 IMPLANT
SLEEVE REPOSITIONING LENGTH 30 (MISCELLANEOUS) ×1 IMPLANT
STATION PROTECTION PRESSURIZED (MISCELLANEOUS) IMPLANT
TRANSDUCER W/STOPCOCK (MISCELLANEOUS) ×2 IMPLANT
WIRE EMERALD 3MM-J .025X260CM (WIRE) ×1 IMPLANT
WIRE MICRO SET SILHO 5FR 7 (SHEATH) ×1 IMPLANT

## 2021-09-08 NOTE — Progress Notes (Signed)
Heart Failure Navigator Progress Note  Assessed for Heart & Vascular TOC clinic readiness.  Patient does not meet criteria due to AHF rounding team consulted this admission. Dr. Aundra Dubin currently attending physician.   Navigator available for reassessment of patient.   Pricilla Holm, MSN, RN Heart Failure Nurse Navigator [phone]

## 2021-09-08 NOTE — Progress Notes (Signed)
Patient ID: Isaac [name], male   DOB: 1950/10/09, 70 y.o.   MRN: [id number]     Advanced Heart Failure Rounding Note  PCP-Cardiologist: None   Subjective:    Started on milrinone 0.25 and increased to 0.375.  Creatinine 2.2 => 1.99.  Lactate 2.2 => 1.6.  Co-ox up to 60%.  He remains in atrial fibrillation in 70s.  CVP 14.   He feels considerably better since starting milrinone.  UOP starting to pick up some it appears.    Objective:   Weight Range: 78.4 kg Body mass index is 25.52 kg/m.   Vital Signs:   Temp:  [97.2 F (36.2 C)-97.3 F (36.3 C)] 97.3 F (36.3 C) (12/18 1900) Pulse Rate:  [72-114] 80 (12/19 0600) Resp:  [12-54] 21 (12/19 0600) BP: (83-107)/(51-83) 100/79 (12/19 0600) SpO2:  [95 %-100 %] 98 % (12/19 0600) Weight:  [78.2 kg-78.4 kg] 78.4 kg (12/19 0500) Last BM Date: 09/07/21  Weight change: Filed Weights   09/07/21 0419 09/07/21 1400 09/08/21 0500  Weight: 78.2 kg 78.2 kg 78.4 kg    Intake/Output:   Intake/Output Summary (Last 24 hours) at 09/08/2021 0743 Last data filed at 09/08/2021 0700 Gross per 24 hour  Intake 1820.62 ml  Output 1920 ml  Net -99.38 ml      Physical Exam    General:  Well appearing. No resp difficulty HEENT: Normal Neck: Supple. JVP 14 cm. Carotids 2+ bilat; no bruits. No lymphadenopathy or thyromegaly appreciated. Cor: PMI lateral. Irregular rate & rhythm. No rubs, gallops or murmurs. Lungs: Clear Abdomen: Soft, nontender, nondistended. No hepatosplenomegaly. No bruits or masses. Good bowel sounds. Extremities: No cyanosis, clubbing, rash. 1+ edema to thighs.  Neuro: Alert & orientedx3, cranial nerves grossly intact. moves all 4 extremities w/o difficulty. Affect pleasant   Telemetry   Atrial fibrillation with intermittent V-pacing (personally reviewed)  Labs    CBC Recent Labs    09/06/21 1242 09/07/21 1140 09/08/21 0141  WBC 6.7 6.2 4.9  NEUTROABS 1.5*  --   --   HGB 15.4 15.1 12.9*  HCT 47.5 45.4  38.8*  MCV 94.1 92.3 92.2  PLT 330 326 220   Basic Metabolic Panel Recent Labs    09/06/21 1242 09/07/21 0347 09/08/21 0141  NA 136 136 130*  K 4.1 3.8 4.3  CL 103 102 98  CO2 25 24 21*  GLUCOSE 94 90 213*  BUN 40* 40* 41*  CREATININE 1.95* 1.99* 2.20*  CALCIUM 9.2 9.1 8.5*  MG 2.2  --  2.1  PHOS 3.8  --   --    Liver Function Tests Recent Labs    09/06/21 1242 09/08/21 0141  AST 34 33  ALT 32 28  ALKPHOS 114 93  BILITOT 2.8* 2.5*  PROT 7.1 5.7*  ALBUMIN 3.4* 2.8*   No results for input(s): LIPASE, AMYLASE in the last 72 hours. Cardiac Enzymes No results for input(s): CKTOTAL, CKMB, CKMBINDEX, TROPONINI in the last 72 hours.  BNP: BNP (last 3 results) Recent Labs    08/22/21 0510 09/04/21 1443 09/06/21 1243  BNP 1,593.4* 1,644.2* 1,614.8*    ProBNP (last 3 results) No results for input(s): PROBNP in the last 8760 hours.   D-Dimer No results for input(s): DDIMER in the last 72 hours. Hemoglobin A1C No results for input(s): HGBA1C in the last 72 hours. Fasting Lipid Panel Recent Labs    09/08/21 0141  CHOL 94  HDL 25*  LDLCALC 58  TRIG 54  CHOLHDL 3.8  Thyroid Function Tests Recent Labs    09/06/21 1242  TSH 8.683*    Other results:   Imaging    DG CHEST PORT 1 VIEW  Result Date: 09/07/2021 CLINICAL DATA:  PICC line placement. EXAM: PORTABLE CHEST 1 VIEW COMPARISON:  Chest x-ray 08/27/2021. FINDINGS: There is a new right-sided central venous catheter with distal tip projecting over the mid SVC. There is no pneumothorax. The cardiac silhouette is enlarged, unchanged. Left-sided pacemaker is again noted. There is a stable small left pleural effusion. The lungs are otherwise clear. No acute fractures are seen. IMPRESSION: 1. New right-sided central venous catheter with tip projecting over the mid SVC. 2. Stable cardiomegaly with small left pleural effusion. Electronically Signed   By: Ronney Asters M.D.   On: 09/07/2021 17:56   [place] EKG  SITE RITE  Result Date: 09/07/2021 If Site Rite image not attached, placement could not be confirmed due to current cardiac rhythm.    Medications:     Scheduled Medications:  Chlorhexidine Gluconate Cloth  6 each Topical Daily   midodrine  2.5 mg Oral TID WC   montelukast  5 mg Oral QHS   potassium chloride  20 mEq Oral Daily   rosuvastatin  10 mg Oral Daily   sodium chloride flush  10-40 mL Intracatheter Q12H   sodium chloride flush  3 mL Intravenous Q12H   sodium chloride flush  3 mL Intravenous Q12H    Infusions:  sodium chloride     sodium chloride     sodium chloride     amiodarone 30 mg/hr (09/08/21 0700)   furosemide (LASIX) 200 mg in dextrose 5% 100 mL (2mg /mL) infusion 20 mg/hr (09/08/21 0700)   heparin 850 Units/hr (09/08/21 0700)   milrinone 0.375 mcg/kg/min (09/08/21 0700)    PRN Medications: sodium chloride, sodium chloride, acetaminophen, albuterol, ondansetron (ZOFRAN) IV, sodium chloride flush, sodium chloride flush, sodium chloride flush    Assessment/Plan   1. Acute on chronic systolic CHF:  Long-standing cardiomyopathy.  [place].  Echo this admission with EF < 20%, severe LV dilation, restrictive diastolic function, moderate RV dysfunction, moderate MR, mod-severe TR. Cause of cardiomyopathy is uncertain.  He has a history of CAD, but I do not think that the described CAD from the past could explain his cardiomyopathy, but CAD could have progressed.  With difficulty tolerating GDMT/need for midodrine and cardiorenal syndrome as well as profound volume overload, I was concerned for low output HF.  Co-ox off milrinone was 36%, milrinone started and increased to 0.375.  This morning, co-ox 60% with lactate normalized.  He feels much better.  UOP starting to pick up, creatinine mildly higher at 2.2.  CVP 14 today, volume overloaded on exam.   - Continue Lasix 20 mg/hr, 1 dose of metolazone 2.5 x 1.   - Milrinone 0.375 to continue.  - Continue home  midodrine.  - Will aim for RHC today, consider Impella 5.5 support depending on findings.  Will need eventual coronary angiography but in absence of ACS, would wait until creatinine stabilizes. Discussed risks/benefits with patient and he agrees to procedure.  - Will need to try to get him back into NSR (see below).  - He is currently RV pacing frequently which is not ideal, will need to see if this is improved by regaining NSR.  Also will ask EP today to adjust his device to try to decrease RV pacing.  - I worry about his trajectory, we have discussed that he may need  an LVAD in the future.  Possible candidate if renal function improves/stabilizes.  RV looks moderately dysfunctional on my assessment of echo.  2. Atrial fibrillation: Persistent since 10/22 based on device interrogation.  Would benefit from resumption of NSR.  - Continue amiodarone gtt.  - He is on heparin gtt.  - Will aim to diurese then try to wean milrinone and cardiovert.  3. CKD stage 3: Creatinine 1.99 => 2.2 today, suspect cardiorenal syndrome.  Follow closely with diuresis. Hopefully will improve with improved cardiac output on milrinone.  4. Syncope: Prior to admission, stood up then passed out.  ?Orthostatic versus arrhythmic.  He had VT run terminated by [employer] that may have been around the time of syncope.   - Now on amiodarone gtt.  5. Elevated TSH/elevated free T4: Mixed picture, ?sick euthyroid.  Will send free T3.  6. CAD: History of PCI to OM1 in 2007 and RCA in 2013.  No recent chest pain.  - Would aim for coronary angiography once creatinine stabilizes - Continue Crestor.   CRITICAL CARE Performed by: Loralie Champagne  Total critical care time: 35 minutes  Critical care time was exclusive of separately billable procedures and treating other patients.  Critical care was necessary to treat or prevent imminent or life-threatening deterioration.  Critical care was time spent personally by me on the following  activities: development of treatment plan with patient and/or surrogate as well as nursing, discussions with consultants, evaluation of patient's response to treatment, examination of patient, obtaining history from patient or surrogate, ordering and performing treatments and interventions, ordering and review of laboratory studies, ordering and review of radiographic studies, pulse oximetry and re-evaluation of patient's condition.   Length of Stay: 2  Loralie Champagne, MD  09/08/2021, 7:43 AM  Advanced Heart Failure Team Pager [phone] (M-F; 7a - 5p)  Please contact [facility] for night-coverage after hours (5p -7a ) and weekends on [email or web address]

## 2021-09-08 NOTE — Progress Notes (Addendum)
Reviewed chart earlier in the night after low Co-ox however this was right before starting milrinone. Rechecked ~1 hr later after milrinone 0.25 mcg/kg/min started by Dr. Aundra Dubin.   Co-ox (36.6) at 1747->(48.4) at 20:14 after on milrinone for 1h. Milrinone adjusted by Dr. Aundra Dubin from 0.25 mcg->0.375 mcg. Rechecked UOP at 0130 and still with minimal for shift (< 250 cc out), no missed occurrences, bladder scan < 150 cc.   Patient resting in bed, says feels somewhat better. Not working to breath. Intermittently paced on monitor, still in AF but rate controlled in 70s. Warm on exam. No nausea.   HR 70s, rate controlled AF/intermittent pacing BP 90/64 (72) O2 93% on RA  RR 12-14 CVP R PICC and zero'd at 19  Infusions Amio at 30 mg/h Milrinone at 0.375 mcg/kg/min Heparin at 1050 unit/hour Lasix at 12 mg/h  Will bolus lasix 120 mg IV now  Increase lasix gtt from 12 mg/h->20 mg/h  Recheck lactate, co-ox, CMP, Mg and CBC now. No baseline CMP, may need to d/c prn tylenol and crestor if shock liver.

## 2021-09-08 NOTE — Plan of Care (Signed)
Discussed with Dr. Aundra Dubin, cardiology will assume care, I will sign off.    Estill Cotta M.D.  [facility] 09/08/2021, 7:46 AM

## 2021-09-08 NOTE — Interval H&P Note (Signed)
History and Physical Interval Note:  09/08/2021 12:50 PM  Isaac [name]  has presented today for surgery, with the diagnosis of HF.  The various methods of treatment have been discussed with the patient and family. After consideration of risks, benefits and other options for treatment, the patient has consented to  Procedure(s): RIGHT HEART CATH (N/A) as a surgical intervention.  The patient's history has been reviewed, patient examined, no change in status, stable for surgery.  I have reviewed the patient's chart and labs.  Questions were answered to the patient's satisfaction.     [name]

## 2021-09-08 NOTE — Progress Notes (Signed)
ANTICOAGULATION CONSULT NOTE   Pharmacy Consult for Heparin Indication: atrial fibrillation  Allergies  Allergen Reactions   Mushroom Extract Complex Nausea And Vomiting   Neosporin [Neomycin-Bacitracin Zn-Polymyx] Hives    Patient Measurements: Height: 5\' 9"  (175.3 cm) Weight: 78.2 kg (172 lb 6.4 oz) IBW/kg (Calculated) : 70.7 Heparin Dosing Weight: 81.6 kg  Vital Signs: Temp: 97.3 F (36.3 C) (12/18 1900) Temp Source: Oral (12/18 1900) BP: 102/75 (12/19 0400) Pulse Rate: 80 (12/19 0400)  Labs: Recent Labs     0000 09/06/21 1116 09/06/21 1242 09/06/21 2039 09/06/21 2047 09/07/21 0347 09/07/21 1140 09/07/21 1456 09/08/21 0141 09/08/21 0340  HGB   < >  --  15.4  --   --   --  15.1  --  12.9*  --   HCT  --   --  47.5  --   --   --  45.4  --  38.8*  --   PLT  --   --  330  --   --   --  326  --  275  --   APTT   < >  --   --  38*  --  75* 128* 195* >200* 158*  LABPROT  --   --   --  22.5*  --   --   --   --   --   --   INR  --   --   --  2.0*  --   --   --   --   --   --   HEPARINUNFRC  --   --   --   --    < > >1.10* >1.10*  --  >1.10* >1.10*  CREATININE  --   --  1.95*  --   --  1.99*  --   --  2.20*  --   TROPONINIHS  --  27*  --   --   --   --   --   --   --   --    < > = values in this interval not displayed.     Estimated Creatinine Clearance: 31.7 mL/min (A) (by C-G formula based on SCr of 2.2 mg/dL (H)).   Medical History: Past Medical History:  Diagnosis Date   Arrhythmia    atrial fibrillation   CHF (congestive heart failure) (HCC)    Chronic kidney disease    Coronary artery disease    Hyperlipidemia    Hypertension    Myocardial infarct (HCC)     Medications:  Medications Prior to Admission  Medication Sig Dispense Refill Last Dose   albuterol (VENTOLIN HFA) 108 (90 Base) MCG/ACT inhaler Inhale 2 puffs into the lungs every 6 (six) hours as needed.   unknown   apixaban (ELIQUIS) 5 MG TABS tablet Take 1 tablet (5 mg total) by mouth 2 (two)  times daily. 60 tablet 2 09/06/2021 at 0500   furosemide (LASIX) 20 MG tablet Take 1 tablet (20 mg total) by mouth daily as needed for edema or fluid (sob). (Patient taking differently: Take 20 mg by mouth daily.) 90 tablet 0 Past Week   midodrine (PROAMATINE) 2.5 MG tablet Take 1 tablet (2.5 mg total) by mouth 3 (three) times daily with meals. 90 tablet 2 09/06/2021   amiodarone (PACERONE) 200 MG tablet Take 1 tablet (200 mg total) by mouth 2 (two) times daily. (Patient not taking: Reported on 08/29/2021) 60 tablet 2 Not Taking   metoprolol tartrate (LOPRESSOR) 25 MG tablet  Take 0.5 tablets (12.5 mg total) by mouth 2 (two) times daily. (Patient not taking: Reported on 08/29/2021) 30 tablet 2 Not Taking   montelukast (SINGULAIR) 10 MG tablet Take by mouth. (Patient not taking: Reported on 09/06/2021)   Not Taking   spironolactone (ALDACTONE) 25 MG tablet Take 0.5 tablets (12.5 mg total) by mouth daily. (Patient not taking: Reported on 08/29/2021) 15 tablet 2 Not Taking   tamsulosin (FLOMAX) 0.4 MG CAPS capsule TAKE 1 CAPSULE (0.4 MG TOTAL) BY MOUTH DAILY AFTER BREAKFAST. (Patient not taking: Reported on 08/29/2021) 30 capsule 0 Not Taking    Scheduled:   aspirin  81 mg Oral Pre-Cath   Chlorhexidine Gluconate Cloth  6 each Topical Daily   midodrine  2.5 mg Oral TID WC   montelukast  5 mg Oral QHS   potassium chloride  20 mEq Oral Daily   rosuvastatin  10 mg Oral Daily   sodium chloride flush  10-40 mL Intracatheter Q12H   sodium chloride flush  3 mL Intravenous Q12H   sodium chloride flush  3 mL Intravenous Q12H   Infusions:   sodium chloride     sodium chloride     sodium chloride     amiodarone 30 mg/hr (09/08/21 0400)   furosemide (LASIX) 200 mg in dextrose 5% 100 mL (2mg /mL) infusion 20 mg/hr (09/08/21 0400)   heparin     milrinone 0.375 mcg/kg/min (09/08/21 0400)   PRN: sodium chloride, sodium chloride, acetaminophen, albuterol, ondansetron (ZOFRAN) IV, sodium chloride flush, sodium  chloride flush, sodium chloride flush  Assessment: 44 yom with a history of stage of systolic CHF chronic LVEF less than 20%, status post AICD/PPM, PAF on Eliquis, CKD stage IIIb, HTN, HLD. Patient is presenting with weakness, near syncope, and poor oral intake. Heparin per pharmacy consult placed for atrial fibrillation in preparation for RHC on Monday.  Patient is on apixaban prior to arrival. Last dose ~4am on 12/17. Will require aPTT monitoring due to likely falsely high anti-Xa level secondary to DOAC use.  Hgb 15.4; plt 330  12/19 AM update:  aPTT elevated  Goal of Therapy:  Heparin level 0.3-0.7 units/ml aPTT 66-102 seconds Monitor platelets by anticoagulation protocol: Yes   Plan:  Hold heparin x 1 hr Re-start heparin at 850 units/hr Check aPTT & anti-Xa level in 8 hours and daily while on heparin Continue to monitor via aPTT until levels are correlated Continue to monitor H&H and platelets  Narda Bonds, PharmD, BCPS Clinical Pharmacist Phone: [phone]

## 2021-09-08 NOTE — H&P (View-Only) (Signed)
Patient ID: Isaac [name], male   DOB: 1950/10/17, 70 y.o.   MRN: [id number]     Advanced Heart Failure Rounding Note  PCP-Cardiologist: None   Subjective:    Started on milrinone 0.25 and increased to 0.375.  Creatinine 2.2 => 1.99.  Lactate 2.2 => 1.6.  Co-ox up to 60%.  He remains in atrial fibrillation in 70s.  CVP 14.   He feels considerably better since starting milrinone.  UOP starting to pick up some it appears.    Objective:   Weight Range: 78.4 kg Body mass index is 25.52 kg/m.   Vital Signs:   Temp:  [97.2 F (36.2 C)-97.3 F (36.3 C)] 97.3 F (36.3 C) (12/18 1900) Pulse Rate:  [72-114] 80 (12/19 0600) Resp:  [12-54] 21 (12/19 0600) BP: (83-107)/(51-83) 100/79 (12/19 0600) SpO2:  [95 %-100 %] 98 % (12/19 0600) Weight:  [78.2 kg-78.4 kg] 78.4 kg (12/19 0500) Last BM Date: 09/07/21  Weight change: Filed Weights   09/07/21 0419 09/07/21 1400 09/08/21 0500  Weight: 78.2 kg 78.2 kg 78.4 kg    Intake/Output:   Intake/Output Summary (Last 24 hours) at 09/08/2021 0743 Last data filed at 09/08/2021 0700 Gross per 24 hour  Intake 1820.62 ml  Output 1920 ml  Net -99.38 ml      Physical Exam    General:  Well appearing. No resp difficulty HEENT: Normal Neck: Supple. JVP 14 cm. Carotids 2+ bilat; no bruits. No lymphadenopathy or thyromegaly appreciated. Cor: PMI lateral. Irregular rate & rhythm. No rubs, gallops or murmurs. Lungs: Clear Abdomen: Soft, nontender, nondistended. No hepatosplenomegaly. No bruits or masses. Good bowel sounds. Extremities: No cyanosis, clubbing, rash. 1+ edema to thighs.  Neuro: Alert & orientedx3, cranial nerves grossly intact. moves all 4 extremities w/o difficulty. Affect pleasant   Telemetry   Atrial fibrillation with intermittent V-pacing (personally reviewed)  Labs    CBC Recent Labs    09/06/21 1242 09/07/21 1140 09/08/21 0141  WBC 6.7 6.2 4.9  NEUTROABS 1.5*  --   --   HGB 15.4 15.1 12.9*  HCT 47.5 45.4  38.8*  MCV 94.1 92.3 92.2  PLT 330 326 834   Basic Metabolic Panel Recent Labs    09/06/21 1242 09/07/21 0347 09/08/21 0141  NA 136 136 130*  K 4.1 3.8 4.3  CL 103 102 98  CO2 25 24 21*  GLUCOSE 94 90 213*  BUN 40* 40* 41*  CREATININE 1.95* 1.99* 2.20*  CALCIUM 9.2 9.1 8.5*  MG 2.2  --  2.1  PHOS 3.8  --   --    Liver Function Tests Recent Labs    09/06/21 1242 09/08/21 0141  AST 34 33  ALT 32 28  ALKPHOS 114 93  BILITOT 2.8* 2.5*  PROT 7.1 5.7*  ALBUMIN 3.4* 2.8*   No results for input(s): LIPASE, AMYLASE in the last 72 hours. Cardiac Enzymes No results for input(s): CKTOTAL, CKMB, CKMBINDEX, TROPONINI in the last 72 hours.  BNP: BNP (last 3 results) Recent Labs    08/22/21 0510 09/04/21 1443 09/06/21 1243  BNP 1,593.4* 1,644.2* 1,614.8*    ProBNP (last 3 results) No results for input(s): PROBNP in the last 8760 hours.   D-Dimer No results for input(s): DDIMER in the last 72 hours. Hemoglobin A1C No results for input(s): HGBA1C in the last 72 hours. Fasting Lipid Panel Recent Labs    09/08/21 0141  CHOL 94  HDL 25*  LDLCALC 58  TRIG 54  CHOLHDL 3.8  Thyroid Function Tests Recent Labs    09/06/21 1242  TSH 8.683*    Other results:   Imaging    DG CHEST PORT 1 VIEW  Result Date: 09/07/2021 CLINICAL DATA:  PICC line placement. EXAM: PORTABLE CHEST 1 VIEW COMPARISON:  Chest x-ray 08/27/2021. FINDINGS: There is a new right-sided central venous catheter with distal tip projecting over the mid SVC. There is no pneumothorax. The cardiac silhouette is enlarged, unchanged. Left-sided pacemaker is again noted. There is a stable small left pleural effusion. The lungs are otherwise clear. No acute fractures are seen. IMPRESSION: 1. New right-sided central venous catheter with tip projecting over the mid SVC. 2. Stable cardiomegaly with small left pleural effusion. Electronically Signed   By: Ronney Asters M.D.   On: 09/07/2021 17:56   [place] EKG  SITE RITE  Result Date: 09/07/2021 If Site Rite image not attached, placement could not be confirmed due to current cardiac rhythm.    Medications:     Scheduled Medications:  Chlorhexidine Gluconate Cloth  6 each Topical Daily   midodrine  2.5 mg Oral TID WC   montelukast  5 mg Oral QHS   potassium chloride  20 mEq Oral Daily   rosuvastatin  10 mg Oral Daily   sodium chloride flush  10-40 mL Intracatheter Q12H   sodium chloride flush  3 mL Intravenous Q12H   sodium chloride flush  3 mL Intravenous Q12H    Infusions:  sodium chloride     sodium chloride     sodium chloride     amiodarone 30 mg/hr (09/08/21 0700)   furosemide (LASIX) 200 mg in dextrose 5% 100 mL (2mg /mL) infusion 20 mg/hr (09/08/21 0700)   heparin 850 Units/hr (09/08/21 0700)   milrinone 0.375 mcg/kg/min (09/08/21 0700)    PRN Medications: sodium chloride, sodium chloride, acetaminophen, albuterol, ondansetron (ZOFRAN) IV, sodium chloride flush, sodium chloride flush, sodium chloride flush    Assessment/Plan   1. Acute on chronic systolic CHF:  Long-standing cardiomyopathy.  [place].  Echo this admission with EF < 20%, severe LV dilation, restrictive diastolic function, moderate RV dysfunction, moderate MR, mod-severe TR. Cause of cardiomyopathy is uncertain.  He has a history of CAD, but I do not think that the described CAD from the past could explain his cardiomyopathy, but CAD could have progressed.  With difficulty tolerating GDMT/need for midodrine and cardiorenal syndrome as well as profound volume overload, I was concerned for low output HF.  Co-ox off milrinone was 36%, milrinone started and increased to 0.375.  This morning, co-ox 60% with lactate normalized.  He feels much better.  UOP starting to pick up, creatinine mildly higher at 2.2.  CVP 14 today, volume overloaded on exam.   - Continue Lasix 20 mg/hr, 1 dose of metolazone 2.5 x 1.   - Milrinone 0.375 to continue.  - Continue home  midodrine.  - Will aim for RHC today, consider Impella 5.5 support depending on findings.  Will need eventual coronary angiography but in absence of ACS, would wait until creatinine stabilizes. Discussed risks/benefits with patient and he agrees to procedure.  - Will need to try to get him back into NSR (see below).  - He is currently RV pacing frequently which is not ideal, will need to see if this is improved by regaining NSR.  Also will ask EP today to adjust his device to try to decrease RV pacing.  - I worry about his trajectory, we have discussed that he may need  an LVAD in the future.  Possible candidate if renal function improves/stabilizes.  RV looks moderately dysfunctional on my assessment of echo.  2. Atrial fibrillation: Persistent since 10/22 based on device interrogation.  Would benefit from resumption of NSR.  - Continue amiodarone gtt.  - He is on heparin gtt.  - Will aim to diurese then try to wean milrinone and cardiovert.  3. CKD stage 3: Creatinine 1.99 => 2.2 today, suspect cardiorenal syndrome.  Follow closely with diuresis. Hopefully will improve with improved cardiac output on milrinone.  4. Syncope: Prior to admission, stood up then passed out.  ?Orthostatic versus arrhythmic.  He had VT run terminated by [employer] that may have been around the time of syncope.   - Now on amiodarone gtt.  5. Elevated TSH/elevated free T4: Mixed picture, ?sick euthyroid.  Will send free T3.  6. CAD: History of PCI to OM1 in 2007 and RCA in 2013.  No recent chest pain.  - Would aim for coronary angiography once creatinine stabilizes - Continue Crestor.   CRITICAL CARE Performed by: Loralie Champagne  Total critical care time: 35 minutes  Critical care time was exclusive of separately billable procedures and treating other patients.  Critical care was necessary to treat or prevent imminent or life-threatening deterioration.  Critical care was time spent personally by me on the following  activities: development of treatment plan with patient and/or surrogate as well as nursing, discussions with consultants, evaluation of patient's response to treatment, examination of patient, obtaining history from patient or surrogate, ordering and performing treatments and interventions, ordering and review of laboratory studies, ordering and review of radiographic studies, pulse oximetry and re-evaluation of patient's condition.   Length of Stay: 2  Loralie Champagne, MD  09/08/2021, 7:43 AM  Advanced Heart Failure Team Pager [phone] (M-F; 7a - 5p)  Please contact [facility] for night-coverage after hours (5p -7a ) and weekends on [email or web address]

## 2021-09-08 NOTE — Progress Notes (Addendum)
ANTICOAGULATION CONSULT NOTE   Pharmacy Consult for Heparin Indication: atrial fibrillation  Allergies  Allergen Reactions   Mushroom Extract Complex Nausea And Vomiting   Neosporin [Neomycin-Bacitracin Zn-Polymyx] Hives    Patient Measurements: Height: 5\' 9"  (175.3 cm) Weight: 78.4 kg (172 lb 13.5 oz) IBW/kg (Calculated) : 70.7 Heparin Dosing Weight: 81.6 kg  Vital Signs: Temp: 97 F (36.1 C) (12/19 1430) Temp Source: Oral (12/19 1144) BP: 107/65 (12/19 1430) Pulse Rate: 132 (12/19 1430)  Labs: Recent Labs     0000 09/06/21 1116 09/06/21 1242 09/06/21 2039 09/06/21 2047 09/07/21 0347 09/07/21 1140 09/07/21 1456 09/08/21 0141 09/08/21 0340 09/08/21 1421 09/08/21 1422  HGB   < >  --  15.4  --   --   --  15.1  --  12.9*  --   --   --   HCT  --   --  47.5  --   --   --  45.4  --  38.8*  --   --   --   PLT  --   --  330  --   --   --  326  --  275  --   --   --   APTT   < >  --   --  38*  --  75* 128*   < > >200* 158*  --  41*  LABPROT  --   --   --  22.5*  --   --   --   --   --   --   --   --   INR  --   --   --  2.0*  --   --   --   --   --   --   --   --   HEPARINUNFRC  --   --   --   --    < > >1.10* >1.10*  --  >1.10* >1.10* 1.01*  --   CREATININE  --   --  1.95*  --   --  1.99*  --   --  2.20*  --   --   --   TROPONINIHS  --  27*  --   --   --   --   --   --   --   --   --   --    < > = values in this interval not displayed.     Estimated Creatinine Clearance: 31.7 mL/min (A) (by C-G formula based on SCr of 2.2 mg/dL (H)).   Medical History: Past Medical History:  Diagnosis Date   Arrhythmia    atrial fibrillation   CHF (congestive heart failure) (HCC)    Chronic kidney disease    Coronary artery disease    Hyperlipidemia    Hypertension    Myocardial infarct (HCC)     Medications:  Medications Prior to Admission  Medication Sig Dispense Refill Last Dose   albuterol (VENTOLIN HFA) 108 (90 Base) MCG/ACT inhaler Inhale 2 puffs into the lungs  every 6 (six) hours as needed.   unknown   apixaban (ELIQUIS) 5 MG TABS tablet Take 1 tablet (5 mg total) by mouth 2 (two) times daily. 60 tablet 2 09/06/2021 at 0500   furosemide (LASIX) 20 MG tablet Take 1 tablet (20 mg total) by mouth daily as needed for edema or fluid (sob). (Patient taking differently: Take 20 mg by mouth daily.) 90 tablet 0 Past Week   midodrine (PROAMATINE) 2.5 MG  tablet Take 1 tablet (2.5 mg total) by mouth 3 (three) times daily with meals. 90 tablet 2 09/06/2021   amiodarone (PACERONE) 200 MG tablet Take 1 tablet (200 mg total) by mouth 2 (two) times daily. (Patient not taking: Reported on 08/29/2021) 60 tablet 2 Not Taking   metoprolol tartrate (LOPRESSOR) 25 MG tablet Take 0.5 tablets (12.5 mg total) by mouth 2 (two) times daily. (Patient not taking: Reported on 08/29/2021) 30 tablet 2 Not Taking   montelukast (SINGULAIR) 10 MG tablet Take by mouth. (Patient not taking: Reported on 09/06/2021)   Not Taking   spironolactone (ALDACTONE) 25 MG tablet Take 0.5 tablets (12.5 mg total) by mouth daily. (Patient not taking: Reported on 08/29/2021) 15 tablet 2 Not Taking   tamsulosin (FLOMAX) 0.4 MG CAPS capsule TAKE 1 CAPSULE (0.4 MG TOTAL) BY MOUTH DAILY AFTER BREAKFAST. (Patient not taking: Reported on 08/29/2021) 30 capsule 0 Not Taking    Scheduled:   Chlorhexidine Gluconate Cloth  6 each Topical Daily   midodrine  2.5 mg Oral TID WC   montelukast  5 mg Oral QHS   potassium chloride  20 mEq Oral Daily   rosuvastatin  10 mg Oral Daily   sodium chloride flush  10-40 mL Intracatheter Q12H   sodium chloride flush  3 mL Intravenous Q12H   sodium chloride flush  3 mL Intravenous Q12H   sodium chloride flush  3 mL Intravenous Q12H   Infusions:   sodium chloride     amiodarone 30 mg/hr (09/08/21 1150)   heparin 850 Units/hr (09/08/21 1400)   milrinone 0.375 mcg/kg/min (09/08/21 1100)   PRN: sodium chloride, acetaminophen, albuterol, ondansetron (ZOFRAN) IV, sodium chloride  flush, sodium chloride flush  Assessment: 62 yom with a history of stage of systolic CHF chronic LVEF less than 20%, status post AICD/PPM, PAF on Eliquis, CKD stage IIIb, HTN, HLD. Patient is presenting with weakness, near syncope, and poor oral intake. Heparin per pharmacy consult placed for atrial fibrillation in preparation for RHC on Monday.  Patient is on apixaban prior to arrival. Last dose ~4am on 12/17. Will require aPTT monitoring due to likely falsely high anti-Xa level secondary to DOAC use. Earlier today heparin drip 850 uts/hr infusing in PICC and labs drawn from PICC Heparin changed to PIV and labs drawn from PICC aptt 41sec < goal will increase heparin drip Heparin level continue  to be affected by apixaban will dose heparin vi aptt for now    Goal of Therapy:  Heparin level 0.3-0.7 units/ml aPTT 66-102 seconds Monitor platelets by anticoagulation protocol: Yes   Plan:  Increase heparin drip 900 units/hr Daily aptt and heparin level   Continue to monitor H&H and platelets   Bonnita Nasuti Pharm.D. CPP, BCPS Clinical Pharmacist [phone] 09/08/2021 3:20 PM

## 2021-09-08 NOTE — Progress Notes (Signed)
[facility] EDUCATION NOTE:                VAD evaluation reviewed by patient and wife. Initial VAD teaching initiated with pt and caregiver.   VAD educational packet including "Understanding Your Options with Advanced Heart Failure", "[place] Patient Agreement for VAD Evaluation and Potential Implantation" consent, and [employer] "Heartmate 3 Left Ventricular Device (LVAD) Patient Guide", "[place] HM III Patient Education", "Ages Mechanical Circulatory Support Program", and "Decision Aids for Left Ventricular Assist Device" reviewed in detail and left at bedside for continued reference.     Explained that LVAD can be implanted for two indications in the setting of advanced left ventricular heart failure treatment:  Bridge to transplant - used for patients who cannot safely wait for heart transplant without this device.  Or    Destination therapy - used for patients until end of life or recovery of heart function.   The patient and his wife expressed that this all of this "is a lot to take in" at this time. They would like time to review educational packet and talk with each other. Right heart cath planned later today per Dr. Aundra Dubin and they would like to wait and see what that show.   VAD Coordinator will be back by tomorrow to continue education if patient/wife would like that. We can discuss evaluation further at that time. Contact information for VAD Coordinators and web site information shared with patient for any further questions and additional information. Both patient and wife verbalized agreement to this plan.   Zada Girt, RN VAD Coordinator   Office: [phone] 24/7 VAD Pager: [phone]

## 2021-09-09 ENCOUNTER — Inpatient Hospital Stay (HOSPITAL_COMMUNITY): Payer: Medicare HMO

## 2021-09-09 DIAGNOSIS — R570 Cardiogenic shock: Secondary | ICD-10-CM | POA: Diagnosis not present

## 2021-09-09 DIAGNOSIS — I509 Heart failure, unspecified: Secondary | ICD-10-CM | POA: Diagnosis not present

## 2021-09-09 LAB — BASIC METABOLIC PANEL
Anion gap: 9 (ref 5–15)
BUN: 40 mg/dL — ABNORMAL HIGH (ref 8–23)
CO2: 25 mmol/L (ref 22–32)
Calcium: 8.5 mg/dL — ABNORMAL LOW (ref 8.9–10.3)
Chloride: 98 mmol/L (ref 98–111)
Creatinine, Ser: 2.23 mg/dL — ABNORMAL HIGH (ref 0.61–1.24)
GFR, Estimated: 31 mL/min — ABNORMAL LOW (ref >60)
Glucose, Bld: 114 mg/dL — ABNORMAL HIGH (ref 70–99)
Potassium: 3.6 mmol/L (ref 3.5–5.1)
Sodium: 132 mmol/L — ABNORMAL LOW (ref 135–145)

## 2021-09-09 LAB — COOXEMETRY PANEL
Carboxyhemoglobin: 1.5 % (ref 0.5–1.5)
Methemoglobin: 0.7 % (ref 0.0–1.5)
O2 Saturation: 69.1 %
Total hemoglobin: 12.4 g/dL (ref 12.0–16.0)

## 2021-09-09 LAB — CBC
HCT: 37.2 % — ABNORMAL LOW (ref 39.0–52.0)
Hemoglobin: 12.2 g/dL — ABNORMAL LOW (ref 13.0–17.0)
MCH: 30 pg (ref 26.0–34.0)
MCHC: 32.8 g/dL (ref 30.0–36.0)
MCV: 91.4 fL (ref 80.0–100.0)
Platelets: 227 10*3/uL (ref 150–400)
RBC: 4.07 MIL/uL — ABNORMAL LOW (ref 4.22–5.81)
RDW: 16.4 % — ABNORMAL HIGH (ref 11.5–15.5)
WBC: 7.3 10*3/uL (ref 4.0–10.5)
nRBC: 0 % (ref 0.0–0.2)

## 2021-09-09 LAB — MAGNESIUM: Magnesium: 1.8 mg/dL (ref 1.7–2.4)

## 2021-09-09 LAB — TYPE AND SCREEN
ABO/RH(D): O POS
Antibody Screen: NEGATIVE

## 2021-09-09 LAB — HEPARIN LEVEL (UNFRACTIONATED)
Heparin Unfractionated: 0.76 IU/mL — ABNORMAL HIGH (ref 0.30–0.70)
Heparin Unfractionated: 0.97 IU/mL — ABNORMAL HIGH (ref 0.30–0.70)

## 2021-09-09 LAB — APTT
aPTT: 38 seconds — ABNORMAL HIGH (ref 24–36)
aPTT: 114 seconds — ABNORMAL HIGH (ref 24–36)

## 2021-09-09 LAB — T4, FREE: Free T4: 1.63 ng/dL — ABNORMAL HIGH (ref 0.61–1.12)

## 2021-09-09 LAB — PATHOLOGIST SMEAR REVIEW

## 2021-09-09 LAB — ABO/RH: ABO/RH(D): O POS

## 2021-09-09 LAB — T3, FREE: T3, Free: 1.9 pg/mL — ABNORMAL LOW (ref 2.0–4.4)

## 2021-09-09 MED ORDER — NOREPINEPHRINE 16 MG/250ML-% IV SOLN
0.0000 ug/min | INTRAVENOUS | Status: DC
  Administered 2021-09-09: 09:00:00 3 ug/min via INTRAVENOUS
  Filled 2021-09-09: qty 250

## 2021-09-09 MED ORDER — FUROSEMIDE 10 MG/ML IJ SOLN
40.0000 mg | Freq: Two times a day (BID) | INTRAMUSCULAR | Status: DC
  Administered 2021-09-09 – 2021-09-10 (×3): 40 mg via INTRAVENOUS
  Filled 2021-09-09 (×3): qty 4

## 2021-09-09 MED ORDER — AMIODARONE HCL IN DEXTROSE 360-4.14 MG/200ML-% IV SOLN: 60.0000 mg/h | INTRAVENOUS | Status: DC

## 2021-09-09 MED ORDER — NOREPINEPHRINE 4 MG/250ML-% IV SOLN
3.0000 ug/min | INTRAVENOUS | Status: DC
Start: 2021-09-09 — End: 2021-09-09
  Filled 2021-09-09: qty 250

## 2021-09-09 MED ORDER — AMIODARONE LOAD VIA INFUSION
150.0000 mg | Freq: Once | INTRAVENOUS | Status: AC
  Administered 2021-09-09: 18:00:00 150 mg via INTRAVENOUS
  Filled 2021-09-09: qty 83.34

## 2021-09-09 MED ORDER — NOREPINEPHRINE 4 MG/250ML-% IV SOLN
INTRAVENOUS | Status: AC
  Filled 2021-09-09: qty 250

## 2021-09-09 MED ORDER — FUROSEMIDE 10 MG/ML IJ SOLN: 40.0000 mg | Freq: Once | INTRAMUSCULAR | Status: DC

## 2021-09-09 MED ORDER — POTASSIUM CHLORIDE 20 MEQ PO PACK
40.0000 meq | PACK | Freq: Two times a day (BID) | ORAL | Status: DC
Start: 2021-09-09 — End: 2021-09-11
  Administered 2021-09-09 – 2021-09-10 (×4): 40 meq via ORAL
  Filled 2021-09-09 (×4): qty 2

## 2021-09-09 MED ORDER — IOHEXOL 9 MG/ML PO SOLN
ORAL | Status: AC
  Administered 2021-09-09: 16:00:00 500 mL
  Filled 2021-09-09: qty 1000

## 2021-09-09 MED ORDER — MAGNESIUM SULFATE 2 GM/50ML IV SOLN
2.0000 g | Freq: Once | INTRAVENOUS | Status: AC
Start: 2021-09-09 — End: 2021-09-09
  Administered 2021-09-09: 16:00:00 2 g via INTRAVENOUS
  Filled 2021-09-09: qty 50

## 2021-09-09 MED ORDER — AMIODARONE HCL IN DEXTROSE 360-4.14 MG/200ML-% IV SOLN
30.0000 mg/h | INTRAVENOUS | Status: DC
  Administered 2021-09-09 – 2021-09-10 (×5): 60 mg/h via INTRAVENOUS
  Administered 2021-09-11: 17:00:00 30 mg/h via INTRAVENOUS
  Administered 2021-09-11: 06:00:00 60 mg/h via INTRAVENOUS
  Administered 2021-09-12 – 2021-09-17 (×10): 30 mg/h via INTRAVENOUS
  Filled 2021-09-09 (×18): qty 200

## 2021-09-09 MED ORDER — SORBITOL 70 % SOLN
30.0000 mL | Freq: Once | Status: AC
  Administered 2021-09-09: 09:00:00 30 mL via ORAL
  Filled 2021-09-09: qty 30

## 2021-09-09 MED ORDER — SENNOSIDES-DOCUSATE SODIUM 8.6-50 MG PO TABS
2.0000 | ORAL_TABLET | Freq: Every day | ORAL | Status: DC
  Administered 2021-09-09 – 2021-09-16 (×5): 2 via ORAL
  Filled 2021-09-09 (×5): qty 2

## 2021-09-09 NOTE — Progress Notes (Signed)
PT Cancellation Note  Patient Details Name: Isaac [name] MRN: [id number] DOB: 15-Nov-1950   Cancelled Treatment:    Reason Eval/Treat Not Completed: Medical issues which prohibited therapy. Pt now in ICU with [facility]. Spoke with Darrick Grinder, NP and instructed to hold OOB at this time. Will continue to follow for medical readiness.    [place] 09/09/2021, 9:18 AM [place] Pager [phone] Office [phone]

## 2021-09-09 NOTE — Consult Note (Signed)
[address]       [place] [address]             [phone]                    Jerrald Ronald [place] Medical Record #[id number] Date of Birth: Jan 31, 1951  Referring: No ref. provider found Primary Care: [name], MD Primary Cardiologist: None  Chief Complaint:    Chief Complaint  Patient presents with   Shortness of Breath   syncopal episode    History of Present Illness:    Isaac [name] 70 y.o. male with acute on chronic congestive heart failure.  He is status postplacement of an ICD.  He was noted to have an EF of 20% with severe LV dilation and restrictive diastolic function, and moderate RV dysfunction.  He originally was placed on milrinone but continued to have significant signs of cardiogenic shock, and was started on Levophed today.  CTS has been consulted to assist with Impella cannulation.    Past Medical History:  Diagnosis Date   Arrhythmia    atrial fibrillation   CHF (congestive heart failure) ([facility])    Chronic kidney disease    Coronary artery disease    Hyperlipidemia    Hypertension    Myocardial infarct [facility])     Past Surgical History:  Procedure Laterality Date   CARDIAC DEFIBRILLATOR PLACEMENT  feb 2014   RIGHT HEART CATH N/A 09/08/2021   Procedure: RIGHT HEART CATH;  Surgeon: Larey Dresser, MD;  Location: [facility];  Service: Cardiovascular;  Laterality: N/A;    No family history on file.   Social History   Tobacco Use  Smoking Status Former  Smokeless Tobacco Never    Social History   Substance and Sexual Activity  Alcohol Use No     Allergies  Allergen Reactions   Mushroom Extract Complex Nausea And Vomiting   Neosporin [Neomycin-Bacitracin Zn-Polymyx] Hives    Current Facility-Administered Medications  Medication Dose Route Frequency Provider Last Rate Last Admin   0.9 %  sodium chloride infusion  250 mL Intravenous PRN Wynetta Fines T, MD 10 mL/hr at 09/09/21 1600  Infusion Verify at 09/09/21 1600   acetaminophen (TYLENOL) tablet 650 mg  650 mg Oral Q4H PRN Wynetta Fines T, MD   650 mg at 09/09/21 0014   albuterol (PROVENTIL) (2.5 MG/3ML) 0.083% nebulizer solution 3 mL  3 mL Inhalation Q6H PRN Wynetta Fines T, MD       amiodarone (NEXTERONE PREMIX) 360-4.14 MG/200ML-% (1.8 mg/mL) IV infusion  60 mg/hr Intravenous Continuous Bensimhon, Shaune Pascal, MD 33.3 mL/hr at 09/09/21 1834 60 mg/hr at 09/09/21 1834   Chlorhexidine Gluconate Cloth 2 % PADS 6 each  6 each Topical Daily Rai, Ripudeep K, MD   6 each at 09/08/21 1013   furosemide (LASIX) injection 40 mg  40 mg Intravenous BID Larey Dresser, MD   40 mg at 09/09/21 1735   heparin ADULT infusion 100 units/mL (25000 units/268mL)  900 Units/hr Intravenous Continuous Larey Dresser, MD 9 mL/hr at 09/09/21 1735 900 Units/hr at 09/09/21 1735   milrinone (PRIMACOR) 20 MG/100 ML (0.2 mg/mL) infusion  0.375 mcg/kg/min Intravenous Continuous Larey Dresser, MD 8.8 mL/hr at 09/09/21 1600 0.375 mcg/kg/min at 09/09/21 1600   montelukast (SINGULAIR) tablet 5 mg  5 mg Oral QHS Wynetta Fines T, MD   5 mg at 09/08/21 2138   norepinephrine (LEVOPHED) 16  mg in 234mL premix infusion  3 mcg/min Intravenous Continuous Larey Dresser, MD 2.81 mL/hr at 09/09/21 1600 3 mcg/min at 09/09/21 1600   ondansetron (ZOFRAN) injection 4 mg  4 mg Intravenous Q6H PRN Wynetta Fines T, MD   4 mg at 09/09/21 1603   potassium chloride (KLOR-CON) packet 40 mEq  40 mEq Oral BID Larey Dresser, MD   40 mEq at 09/09/21 8756   rosuvastatin (CRESTOR) tablet 10 mg  10 mg Oral Daily Larey Dresser, MD   10 mg at 09/09/21 0805   senna-docusate (Senokot-S) tablet 2 tablet  2 tablet Oral QHS Larey Dresser, MD       sodium chloride flush (NS) 0.9 % injection 10-40 mL  10-40 mL Intracatheter Q12H Rai, Ripudeep K, MD   10 mL at 09/08/21 2136   sodium chloride flush (NS) 0.9 % injection 10-40 mL  10-40 mL Intracatheter PRN Rai, Ripudeep K, MD       sodium  chloride flush (NS) 0.9 % injection 3 mL  3 mL Intravenous Q12H Wynetta Fines T, MD   3 mL at 09/07/21 1001   sodium chloride flush (NS) 0.9 % injection 3 mL  3 mL Intravenous PRN Wynetta Fines T, MD       sodium chloride flush (NS) 0.9 % injection 3 mL  3 mL Intravenous Q12H Bhagat, Bhavinkumar, PA       sodium chloride flush (NS) 0.9 % injection 3 mL  3 mL Intravenous Q12H Larey Dresser, MD   3 mL at 09/08/21 2135    Review of Systems  Constitutional:  Positive for malaise/fatigue.  Respiratory:  Positive for shortness of breath.   Cardiovascular:  Positive for leg swelling. Negative for chest pain.   PHYSICAL EXAMINATION: BP 102/74    Pulse (!) 105    Temp (!) 100.9 F (38.3 C)    Resp 19    Ht 5\' 9"  (1.753 m)    Wt 74.1 kg Comment: weighed x 2   SpO2 97%    BMI 24.12 kg/m   Physical Exam Constitutional:      Appearance: He is ill-appearing.  Cardiovascular:     Rate and Rhythm: Normal rate.  Pulmonary:     Effort: Pulmonary effort is normal.  Neurological:     General: No focal deficit present.     Mental Status: He is alert and oriented to person, place, and time.     Diagnostic Studies & Laboratory data:     Recent Radiology Findings:   DG Chest 1 View  Result Date: 09/06/2021 CLINICAL DATA:  Syncope EXAM: CHEST  1 VIEW COMPARISON:  Chest x-ray 08/22/2021 FINDINGS: Cardiomegaly unchanged. Mediastinum appears stable. Calcified plaques in the aortic arch. Left-sided cardiac pacemaker device. Pulmonary vasculature appears within normal limits. Stable small left pleural effusion with associated atelectasis/infiltrate. No new consolidation identified. No pneumothorax. IMPRESSION: Cardiomegaly and stable small left pleural effusion with associated atelectasis/infiltrate. Electronically Signed   By: Ofilia Neas M.D.   On: 09/06/2021 10:24   DG Chest 2 View  Result Date: 08/22/2021 CLINICAL DATA:  70 year old male with shortness of breath since 20/1 100 hours, worse when  lying flat. EXAM: CHEST - 2 VIEW COMPARISON:  Chest radiograph 03/03/2020 and earlier. FINDINGS: Stable left chest AICD. Stable cardiac size at the upper limits of normal. Other mediastinal contours are within normal limits. Visualized tracheal air column is within normal limits. Confluent new veiling opacity at the left lung base most resembles a small  to moderate pleural effusion on the lateral view. No definite air bronchograms. Increased pulmonary interstitial opacity, favor vascular congestion. No pneumothorax. Right lung otherwise clear. No acute osseous abnormality identified. Negative visible bowel gas. IMPRESSION: New small to moderate left pleural effusion and increased pulmonary vascular congestion compatible with mild interstitial edema. Electronically Signed   By: Genevie Ann M.D.   On: 08/22/2021 05:47   CARDIAC CATHETERIZATION  Result Date: 09/08/2021 1. Optimized filling pressures. 2. Adequate PAPI. 3. Discrepant cardiac output readings by Fick (good on milrinone) and thermodilution (still low on milrinone).  Luiz Blare will stay in place, will follow CO by Fick and thermo going forwards, hopefully will be more concordant in the future.   DG CHEST PORT 1 VIEW  Result Date: 09/09/2021 CLINICAL DATA:  Heart failure EXAM: PORTABLE CHEST 1 VIEW COMPARISON:  09/07/2021 FINDINGS: Cardiac shadow is enlarged but stable. Defibrillator is again noted and stable. Swan-Ganz catheter is now noted in the pulmonary outflow tract. Right-sided PICC line is noted extending into the mid superior vena cava. Right jugular central line is noted extending to the cavoatrial junction. No pneumothorax is seen. Slight increase in left retrocardiac opacification is noted when compare with the prior study. IMPRESSION: Increasing left retrocardiac consolidation. Tubes and lines as described above. New jugular line is seen without evidence of pneumothorax. Electronically Signed   By: Inez Catalina M.D.   On: 09/09/2021 15:44   DG  CHEST PORT 1 VIEW  Result Date: 09/07/2021 CLINICAL DATA:  PICC line placement. EXAM: PORTABLE CHEST 1 VIEW COMPARISON:  Chest x-ray 08/27/2021. FINDINGS: There is a new right-sided central venous catheter with distal tip projecting over the mid SVC. There is no pneumothorax. The cardiac silhouette is enlarged, unchanged. Left-sided pacemaker is again noted. There is a stable small left pleural effusion. The lungs are otherwise clear. No acute fractures are seen. IMPRESSION: 1. New right-sided central venous catheter with tip projecting over the mid SVC. 2. Stable cardiomegaly with small left pleural effusion. Electronically Signed   By: Ronney Asters M.D.   On: 09/07/2021 17:56   ECHOCARDIOGRAM COMPLETE  Result Date: 08/24/2021    ECHOCARDIOGRAM REPORT   Patient Name:   SHERRILL MCKAMIE Date of Exam: 08/23/2021 Medical Rec #:  [id number]           Height:       69.0 in Accession #:    [id number]          Weight:       175.0 lb Date of Birth:  1951/06/29            BSA:          1.952 m Patient Age:    52 years            BP:           101/86 mmHg Patient Gender: M                   HR:           106 bpm. Exam Location:  [facility] Procedure: 2D Echo and Intracardiac Opacification Agent Indications:     Systolic CHF  History:         Patient has no prior history of Echocardiogram examinations.                  CAD and Previous Myocardial Infarction, Defibrillator,                  Arrythmias:Atrial  Fibrillation; Risk Factors:Hypertension and                  Former Smoker.  Sonographer:     L Thornton-Maynard Referring Phys:  [id number] MATTHEW M ECKSTAT Diagnosing Phys: Yolonda Kida MD IMPRESSIONS  1. Left ventricular ejection fraction, by estimation, is <20%. The left ventricle has severely decreased function. The left ventricle demonstrates global hypokinesis. The left ventricular internal cavity size was severely dilated. Left ventricular diastolic parameters are consistent with Grade III diastolic  dysfunction (restrictive). Elevated left ventricular end-diastolic pressure.  2. Right ventricular systolic function is severely reduced. The right ventricular size is moderately enlarged. There is moderately elevated pulmonary artery systolic pressure.  3. Left atrial size was mild to moderately dilated.  4. Right atrial size was mild to moderately dilated.  5. The mitral valve is grossly normal. Moderate mitral valve regurgitation.  6. Tricuspid valve regurgitation is moderate to severe.  7. The aortic valve is grossly normal. Aortic valve regurgitation is not visualized. FINDINGS  Left Ventricle: Left ventricular ejection fraction, by estimation, is <20%. The left ventricle has severely decreased function. The left ventricle demonstrates global hypokinesis. Definity contrast agent was given IV to delineate the left ventricular endocardial borders. The left ventricular internal cavity size was severely dilated. There is no left ventricular hypertrophy. Left ventricular diastolic parameters are consistent with Grade III diastolic dysfunction (restrictive). Elevated left ventricular end-diastolic pressure. Right Ventricle: The right ventricular size is moderately enlarged. No increase in right ventricular wall thickness. Right ventricular systolic function is severely reduced. There is moderately elevated pulmonary artery systolic pressure. The tricuspid regurgitant velocity is 3.18 m/s, and with an assumed right atrial pressure of 15 mmHg, the estimated right ventricular systolic pressure is 81.8 mmHg. Left Atrium: Left atrial size was mild to moderately dilated. Right Atrium: Right atrial size was mild to moderately dilated. Pericardium: Trivial pericardial effusion is present. Mitral Valve: The mitral valve is grossly normal. The E-point septal separation is normal. Moderate mitral valve regurgitation. MV peak gradient, 3.1 mmHg. The mean mitral valve gradient is 2.0 mmHg. Tricuspid Valve: The tricuspid valve is  grossly normal. Tricuspid valve regurgitation is moderate to severe. Aortic Valve: The aortic valve is grossly normal. Aortic valve regurgitation is not visualized. Aortic valve peak gradient measures 3.5 mmHg. Pulmonic Valve: The pulmonic valve was normal in structure. Pulmonic valve regurgitation is trivial. Aorta: The ascending aorta was not well visualized. IAS/Shunts: No atrial level shunt detected by color flow Doppler. Additional Comments: A device lead is visualized. There is a small pleural effusion in the left lateral region.  LEFT VENTRICLE PLAX 2D LVIDd:         6.01 cm      Diastology LVIDs:         5.85 cm      LV e' medial:    2.25 cm/s LV PW:         0.79 cm      LV E/e' medial:  39.0 LV IVS:        0.96 cm      LV e' lateral:   5.40 cm/s LVOT diam:     2.30 cm      LV E/e' lateral: 16.3 LV SV:         27 LV SV Index:   14 LVOT Area:     4.15 cm  LV Volumes (MOD) LV vol d, MOD A2C: 256.0 ml LV vol d, MOD A4C: 132.5 ml LV vol s, MOD  A2C: 163.0 ml LV vol s, MOD A4C: 121.0 ml LV SV MOD A2C:     93.0 ml LV SV MOD A4C:     132.5 ml LV SV MOD BP:      40.3 ml RIGHT VENTRICLE RV S prime:     4.33 cm/s TAPSE (M-mode): 0.9 cm LEFT ATRIUM             Index        RIGHT ATRIUM           Index LA diam:        4.50 cm 2.31 cm/m   RA Area:     24.00 cm LA Vol (A2C):   78.3 ml 40.11 ml/m  RA Volume:   77.90 ml  39.91 ml/m LA Vol (A4C):   88.8 ml 45.49 ml/m LA Biplane Vol: 89.2 ml 45.70 ml/m  AORTIC VALVE                 PULMONIC VALVE AV Area (Vmax): 1.96 cm     PV Vmax:          0.43 m/s AV Vmax:        93.00 cm/s   PV Peak grad:     0.7 mmHg AV Peak Grad:   3.5 mmHg     PR End Diast Vel: 5.81 msec LVOT Vmax:      43.85 cm/s LVOT Vmean:     28.100 cm/s LVOT VTI:       0.066 m  AORTA Ao Root diam: 3.90 cm Ao Asc diam:  4.00 cm MITRAL VALVE               TRICUSPID VALVE MV Area (PHT): 7.16 cm    TR Peak grad:   40.4 mmHg MV Area VTI:   1.47 cm    TR Vmax:        318.00 cm/s MV Peak grad:  3.1 mmHg MV Mean  grad:  2.0 mmHg    SHUNTS MV Vmax:       0.88 m/s    Systemic VTI:  0.07 m MV Vmean:      66.3 cm/s   Systemic Diam: 2.30 cm MV Decel Time: 106 msec MV E velocity: 87.80 cm/s Yolonda Kida MD Electronically signed by Yolonda Kida MD Signature Date/Time: 08/24/2021/9:33:53 AM    Final    [place] EKG SITE RITE  Result Date: 09/07/2021 If Site Rite image not attached, placement could not be confirmed due to current cardiac rhythm.      I have independently reviewed the above radiology studies  and reviewed the findings with the patient.   Recent Lab Findings: Lab Results  Component Value Date   WBC 7.3 09/09/2021   HGB 12.2 (L) 09/09/2021   HCT 37.2 (L) 09/09/2021   PLT 227 09/09/2021   GLUCOSE 114 (H) 09/09/2021   CHOL 94 09/08/2021   TRIG 54 09/08/2021   HDL 25 (L) 09/08/2021   LDLCALC 58 09/08/2021   ALT 28 09/08/2021   AST 33 09/08/2021   NA 132 (L) 09/09/2021   K 3.6 09/09/2021   CL 98 09/09/2021   CREATININE 2.23 (H) 09/09/2021   BUN 40 (H) 09/09/2021   CO2 25 09/09/2021   TSH 8.683 (H) 09/06/2021   INR 2.0 (H) 09/06/2021   HGBA1C 6.1 (H) 08/23/2021         Assessment / Plan:   70 year old male with acute on chronic congestive heart failure with an EF less than  20%.  He is currently being worked up for a destination LVAD.  He remains in cardiogenic shock on milrinone and Levophed.  We will be available to assist with Impella cannulation on 09/10/2021.      Lajuana Matte 09/09/2021 7:10 PM

## 2021-09-09 NOTE — Progress Notes (Signed)
OT Cancellation Note  Patient Details Name: Isaac [name] MRN: [id number] DOB: 12-05-1950   Cancelled Treatment:    Reason Eval/Treat Not Completed: Medical issues which prohibited therapy Pt now in ICU with [facility]. PT spoke with Darrick Grinder, NP and instructed to hold OOB at this time. Will continue to follow for medical readiness.   Layla Maw 09/09/2021, 9:34 AM

## 2021-09-09 NOTE — Progress Notes (Signed)
ANTICOAGULATION CONSULT NOTE   Pharmacy Consult for Heparin Indication: atrial fibrillation  Allergies  Allergen Reactions   Mushroom Extract Complex Nausea And Vomiting   Neosporin [Neomycin-Bacitracin Zn-Polymyx] Hives    Patient Measurements: Height: 5\' 9"  (175.3 cm) Weight: 74.1 kg (163 lb 5.8 oz) (weighed x 2) IBW/kg (Calculated) : 70.7 Heparin Dosing Weight: 81.6 kg  Vital Signs: Temp: 100.4 F (38 C) (12/20 1600) BP: 114/94 (12/20 1600) Pulse Rate: 119 (12/20 1600)  Labs: Recent Labs    09/06/21 2039 09/06/21 2047 09/07/21 1140 09/07/21 1456 09/08/21 0141 09/08/21 0340 09/08/21 1314 09/08/21 1421 09/08/21 1422 09/09/21 0519 09/09/21 1342  HGB  --    < > 15.1  --  12.9*  --  14.6   14.6  --   --  12.2*  --   HCT  --    < > 45.4  --  38.8*  --  43.0   43.0  --   --  37.2*  --   PLT  --   --  326  --  275  --   --   --   --  227  --   APTT 38*   < > 128*   < > >200*   < >  --   --  41* 38* 114*  LABPROT 22.5*  --   --   --   --   --   --   --   --   --   --   INR 2.0*  --   --   --   --   --   --   --   --   --   --   HEPARINUNFRC  --    < > >1.10*  --  >1.10*   < >  --  1.01*  --  0.76* 0.97*  CREATININE  --    < >  --   --  2.20*  --   --   --  2.21* 2.23*  --    < > = values in this interval not displayed.     Estimated Creatinine Clearance: 31.3 mL/min (A) (by C-G formula based on SCr of 2.23 mg/dL (H)).   Medical History: Past Medical History:  Diagnosis Date   Arrhythmia    atrial fibrillation   CHF (congestive heart failure) (HCC)    Chronic kidney disease    Coronary artery disease    Hyperlipidemia    Hypertension    Myocardial infarct (HCC)     Medications:  Medications Prior to Admission  Medication Sig Dispense Refill Last Dose   albuterol (VENTOLIN HFA) 108 (90 Base) MCG/ACT inhaler Inhale 2 puffs into the lungs every 6 (six) hours as needed.   unknown   apixaban (ELIQUIS) 5 MG TABS tablet Take 1 tablet (5 mg total) by mouth 2  (two) times daily. 60 tablet 2 09/06/2021 at 0500   furosemide (LASIX) 20 MG tablet Take 1 tablet (20 mg total) by mouth daily as needed for edema or fluid (sob). (Patient taking differently: Take 20 mg by mouth daily.) 90 tablet 0 Past Week   midodrine (PROAMATINE) 2.5 MG tablet Take 1 tablet (2.5 mg total) by mouth 3 (three) times daily with meals. 90 tablet 2 09/06/2021   amiodarone (PACERONE) 200 MG tablet Take 1 tablet (200 mg total) by mouth 2 (two) times daily. (Patient not taking: Reported on 08/29/2021) 60 tablet 2 Not Taking   metoprolol tartrate (LOPRESSOR) 25 MG tablet Take  0.5 tablets (12.5 mg total) by mouth 2 (two) times daily. (Patient not taking: Reported on 08/29/2021) 30 tablet 2 Not Taking   montelukast (SINGULAIR) 10 MG tablet Take by mouth. (Patient not taking: Reported on 09/06/2021)   Not Taking   spironolactone (ALDACTONE) 25 MG tablet Take 0.5 tablets (12.5 mg total) by mouth daily. (Patient not taking: Reported on 08/29/2021) 15 tablet 2 Not Taking   tamsulosin (FLOMAX) 0.4 MG CAPS capsule TAKE 1 CAPSULE (0.4 MG TOTAL) BY MOUTH DAILY AFTER BREAKFAST. (Patient not taking: Reported on 08/29/2021) 30 capsule 0 Not Taking    Scheduled:   Chlorhexidine Gluconate Cloth  6 each Topical Daily   furosemide  40 mg Intravenous BID   montelukast  5 mg Oral QHS   potassium chloride  40 mEq Oral BID   rosuvastatin  10 mg Oral Daily   senna-docusate  2 tablet Oral QHS   sodium chloride flush  10-40 mL Intracatheter Q12H   sodium chloride flush  3 mL Intravenous Q12H   sodium chloride flush  3 mL Intravenous Q12H   sodium chloride flush  3 mL Intravenous Q12H   Infusions:   sodium chloride 10 mL/hr at 09/09/21 1600   amiodarone 30 mg/hr (09/09/21 1310)   heparin 1,050 Units/hr (09/09/21 1600)   magnesium sulfate bolus IVPB 10 mL/hr at 09/09/21 1600   milrinone 0.375 mcg/kg/min (09/09/21 1600)   norepinephrine (LEVOPHED) Adult infusion 3 mcg/min (09/09/21 1600)   PRN: sodium  chloride, acetaminophen, albuterol, ondansetron (ZOFRAN) IV, sodium chloride flush, sodium chloride flush  Assessment: 63 yom with a history of stage of systolic CHF chronic LVEF less than 20%, status post AICD/PPM, PAF on Eliquis, CKD stage IIIb, HTN, HLD. Patient is presenting with weakness, near syncope, and poor oral intake. Heparin per pharmacy consult placed for atrial fibrillation in preparation for RHC on Monday.  Patient was on apixaban prior to arrival. Last dose ~4am on 12/17. Will require aPTT monitoring due to likely falsely high anti-Xa level secondary to DOAC use.  Heparin drip 1050 uts/hr moved to run through central line as PIV infiltrated.   Aptt drawn via peripheral stick aptt elevated > goal 114 sec.   CBC stable  Planning impella placement tomorrow.  Goal of Therapy:  Heparin level 0.3-0.7 units/ml aPTT 66-102 seconds Monitor platelets by anticoagulation protocol: Yes   Plan:  Decrease heparin to 900 units/hr Check aPTT & anti-Xa level daily while on heparin Continue to monitor via aPTT until levels are correlated Continue to monitor H&H and platelets    Bonnita Nasuti Pharm.D. CPP, BCPS Clinical Pharmacist [phone] 09/09/2021 4:43 PM

## 2021-09-09 NOTE — Progress Notes (Signed)
Approx 2000-2045 To radiology via bed, accompanied by RN/transport. CT chest/abd/pelvis and orthopantogram completed. Pt tolerated well.

## 2021-09-09 NOTE — Plan of Care (Signed)

## 2021-09-09 NOTE — Progress Notes (Signed)
ANTICOAGULATION CONSULT NOTE   Pharmacy Consult for Heparin Indication: atrial fibrillation  Allergies  Allergen Reactions   Mushroom Extract Complex Nausea And Vomiting   Neosporin [Neomycin-Bacitracin Zn-Polymyx] Hives    Patient Measurements: Height: 5\' 9"  (175.3 cm) Weight: 74.1 kg (163 lb 5.8 oz) (weighed x 2) IBW/kg (Calculated) : 70.7 Heparin Dosing Weight: 81.6 kg  Vital Signs: Temp: 97.9 F (36.6 C) (12/20 0600) Temp Source: Core (12/20 0400) BP: 103/92 (12/20 0600) Pulse Rate: 88 (12/20 0600)  Labs: Recent Labs    09/06/21 1116 09/06/21 1242 09/06/21 2039 09/06/21 2047 09/07/21 1140 09/07/21 1456 09/08/21 0141 09/08/21 0340 09/08/21 1314 09/08/21 1421 09/08/21 1422 09/09/21 0519  HGB  --    < >  --   --  15.1  --  12.9*  --  14.6   14.6  --   --  12.2*  HCT  --    < >  --   --  45.4  --  38.8*  --  43.0   43.0  --   --  37.2*  PLT  --    < >  --   --  326  --  275  --   --   --   --  227  APTT  --   --  38*   < > 128*   < > >200* 158*  --   --  41* 38*  LABPROT  --   --  22.5*  --   --   --   --   --   --   --   --   --   INR  --   --  2.0*  --   --   --   --   --   --   --   --   --   HEPARINUNFRC  --   --   --    < > >1.10*  --  >1.10* >1.10*  --  1.01*  --  0.76*  CREATININE  --    < >  --    < >  --   --  2.20*  --   --   --  2.21* 2.23*  TROPONINIHS 27*  --   --   --   --   --   --   --   --   --   --   --    < > = values in this interval not displayed.     Estimated Creatinine Clearance: 31.3 mL/min (A) (by C-G formula based on SCr of 2.23 mg/dL (H)).   Medical History: Past Medical History:  Diagnosis Date   Arrhythmia    atrial fibrillation   CHF (congestive heart failure) (HCC)    Chronic kidney disease    Coronary artery disease    Hyperlipidemia    Hypertension    Myocardial infarct (HCC)     Medications:  Medications Prior to Admission  Medication Sig Dispense Refill Last Dose   albuterol (VENTOLIN HFA) 108 (90 Base) MCG/ACT  inhaler Inhale 2 puffs into the lungs every 6 (six) hours as needed.   unknown   apixaban (ELIQUIS) 5 MG TABS tablet Take 1 tablet (5 mg total) by mouth 2 (two) times daily. 60 tablet 2 09/06/2021 at 0500   furosemide (LASIX) 20 MG tablet Take 1 tablet (20 mg total) by mouth daily as needed for edema or fluid (sob). (Patient taking differently: Take 20 mg by mouth daily.) 90 tablet  0 Past Week   midodrine (PROAMATINE) 2.5 MG tablet Take 1 tablet (2.5 mg total) by mouth 3 (three) times daily with meals. 90 tablet 2 09/06/2021   amiodarone (PACERONE) 200 MG tablet Take 1 tablet (200 mg total) by mouth 2 (two) times daily. (Patient not taking: Reported on 08/29/2021) 60 tablet 2 Not Taking   metoprolol tartrate (LOPRESSOR) 25 MG tablet Take 0.5 tablets (12.5 mg total) by mouth 2 (two) times daily. (Patient not taking: Reported on 08/29/2021) 30 tablet 2 Not Taking   montelukast (SINGULAIR) 10 MG tablet Take by mouth. (Patient not taking: Reported on 09/06/2021)   Not Taking   spironolactone (ALDACTONE) 25 MG tablet Take 0.5 tablets (12.5 mg total) by mouth daily. (Patient not taking: Reported on 08/29/2021) 15 tablet 2 Not Taking   tamsulosin (FLOMAX) 0.4 MG CAPS capsule TAKE 1 CAPSULE (0.4 MG TOTAL) BY MOUTH DAILY AFTER BREAKFAST. (Patient not taking: Reported on 08/29/2021) 30 capsule 0 Not Taking    Scheduled:   Chlorhexidine Gluconate Cloth  6 each Topical Daily   midodrine  2.5 mg Oral TID WC   montelukast  5 mg Oral QHS   potassium chloride  20 mEq Oral Daily   rosuvastatin  10 mg Oral Daily   sodium chloride flush  10-40 mL Intracatheter Q12H   sodium chloride flush  3 mL Intravenous Q12H   sodium chloride flush  3 mL Intravenous Q12H   sodium chloride flush  3 mL Intravenous Q12H   Infusions:   sodium chloride 10 mL/hr at 09/09/21 0600   amiodarone 30 mg/hr (09/09/21 0600)   heparin 900 Units/hr (09/09/21 0600)   milrinone 0.375 mcg/kg/min (09/09/21 0600)   PRN: sodium chloride,  acetaminophen, albuterol, ondansetron (ZOFRAN) IV, sodium chloride flush, sodium chloride flush  Assessment: 46 yom with a history of stage of systolic CHF chronic LVEF less than 20%, status post AICD/PPM, PAF on Eliquis, CKD stage IIIb, HTN, HLD. Patient is presenting with weakness, near syncope, and poor oral intake. Heparin per pharmacy consult placed for atrial fibrillation in preparation for RHC on Monday.  Patient is on apixaban prior to arrival. Last dose ~4am on 12/17. Will require aPTT monitoring due to likely falsely high anti-Xa level secondary to DOAC use.  Hgb 15.4; plt 330  12/20 AM update:  aPTT low  Goal of Therapy:  Heparin level 0.3-0.7 units/ml aPTT 66-102 seconds Monitor platelets by anticoagulation protocol: Yes   Plan:  Inc heparin to 1050 units/hr Check aPTT & anti-Xa level in 8 hours and daily while on heparin Continue to monitor via aPTT until levels are correlated Continue to monitor H&H and platelets  Narda Bonds, PharmD, BCPS Clinical Pharmacist Phone: [phone]

## 2021-09-09 NOTE — Progress Notes (Signed)
MCS EDUCATION NOTE:                VAD evaluation consent reviewed and signed by patient.  Continued VAD teaching with pt; he says wife is fully supportive of him getting LVAD or transplant.    All questions answered regarding VAD implant, hospital stay, and what to expect when discharged home living with a heart pump. Pt identified wife as his primary caregiver if this therapy should be deemed appropriate for Destination Therapy.  Explained need for 24/7 care when pt is discharged home due to sternal precautions, adaptation to living on support, emotional support, consistent and meticulous exit site care and management, medication adherence and high volume of follow up visits with the [facility] after discharge; both pt and caregiver verbalized understanding of above.   Explained that LVAD can be implanted for two indications in the setting of advanced left ventricular heart failure treatment:  Bridge to transplant - used for patients who cannot safely wait for heart transplant without this device.  Or    Destination therapy - used for patients until end of life or recovery of heart function.  Patient and caregiver(s) acknowledge that the indication at this point in time for LVAD therapy would be for Destination Therapy due to patient age and kidney disease.   Provided brief equipment overview and demonstration with HeartMate III training loop including discussion on the following:   a) system controller   c) universal Charity fundraiser   d) battery clips   e) Batteries   f)  Perc lock   g) Percutaneous lead    Reviewed and supplied a copy of home inspection check list stressing that only three pronged grounded power outlets can be used for VAD equipment. Patient confirmed home has electrical outlets that will support the equipment along with access working telephone. His bedroom and bathroom are both on second floor.   Identified the following lifestyle modifications while living on MCS:    1. No driving for at least six weeks and then only if doctor gives permission to do so.   2. No tub baths while pump implanted, and shower only when doctor gives permission.   3. No swimming or submersion in water while implanted with pump.   4. No contact sports or engaging in jumping activities.   5. Always have a backup controller, charged spare batteries, and battery clips nearby at all times in case of emergency.   6. Plan to sleep only when connected to the power module.   7. Exit site care including dressing changes, monitoring for infection, and importance of keeping percutaneous lead stabilized at all times.     One of our current patients visited with him today to answer questions about living on and caring for someone on MCS. Mr. Buxton says he enjoyed meeting with VAD patient and had all of his questions answered.   Reviewed pictures of VAD drive line, site care, dressing changes, and drive line stabilization including securement attachment device and abdominal binder. Discussed with pt and family that they will be required to purchase dressing supplies as long as patient has the VAD in place.   Reinforced need for 24 hour/7 day week caregivers; pt designated wife as caregiver. Patient will also need to abide by sternal precautions with no lifting >10lbs, pushing, pulling and will need assistance with adapting to new life style with VAD equipment and care.   Intermacs patient survival statistics through June 2022 reviewed with patient as follows:  The patient understands that from this discussion it does not mean that they will receive the device, but that depends on an extensive evaluation process. The patient is aware of the fact that if at anytime they want to stop the evaluation process they can.  All questions have been answered at this time and contact information was provided should they encounter any further questions.  Patient is agreeable at this  time to the evaluation process and will move forward.    Zada Girt , RN VAD Coordinator   Office: [phone] 24/7 VAD Pager: [phone]

## 2021-09-09 NOTE — Progress Notes (Addendum)
Patient ID: Isaac [name], male   DOB: [birth year], 70 y.o.   MRN: [id number]     Advanced Heart Failure Rounding Note  PCP-Cardiologist: None   Subjective:   12/19 RHC- RA 7, PA 39/14 (25), PCWP 11, CO 6.4 CI 3. Thermo 3.5 1.8. Lasix drip stopped after cath.   Out 5L overnight. Weight down 9 pounds, CVP still 12  Remains on milrinone 0.375 mcg + amiodarone. CO-OX 69%   Swan #s  CVP 12 PA 42/26 ( 34) PAPi 1.7   CO 3.5  CI 1.8   Creatinine 2.2 => 1.99=>2.23  Complaining of left forearm discomfort.  Objective:   Weight Range: 74.1 kg Body mass index is 24.12 kg/m.   Vital Signs:   Temp:  [97 F (36.1 C)-99.5 F (37.5 C)] 97.9 F (36.6 C) (12/20 0700) Pulse Rate:  [57-132] 96 (12/20 0700) Resp:  [13-37] 22 (12/20 0700) BP: (79-118)/(51-92) 117/79 (12/20 0700) SpO2:  [92 %-100 %] 95 % (12/20 0700) Weight:  [74.1 kg] 74.1 kg (12/20 0600) Last BM Date: 09/07/21  Weight change: Filed Weights   09/07/21 1400 09/08/21 0500 09/09/21 0600  Weight: 78.2 kg 78.4 kg 74.1 kg    Intake/Output:   Intake/Output Summary (Last 24 hours) at 09/09/2021 0800 Last data filed at 09/09/2021 0700 Gross per 24 hour  Intake 2436.46 ml  Output 4375 ml  Net -1938.54 ml      Physical Exam   CVP 12  General: . No resp difficulty HEENT: normal Neck: supple. JVP 11-12  Carotids 2+ bilat; no bruits. No lymphadenopathy or thryomegaly appreciated. Cor: PMI nondisplaced. Irregular rate & rhythm. No rubs, gallops or murmurs. Lungs: clear Abdomen: soft, nontender, nondistended. No hepatosplenomegaly. No bruits or masses. Good bowel sounds. Extremities: no cyanosis, clubbing, rash, edema Neuro: alert & orientedx3, cranial nerves grossly intact. moves all 4 extremities w/o difficulty. Affect pleasant   Telemetry   A fib 80-100s   Labs    CBC Recent Labs    09/06/21 1242 09/07/21 1140 09/08/21 0141 09/08/21 1314 09/09/21 0519  WBC 6.7   < > 4.9  --  7.3  NEUTROABS 1.5*  --    --   --   --   HGB 15.4   < > 12.9* 14.6   14.6 12.2*  HCT 47.5   < > 38.8* 43.0   43.0 37.2*  MCV 94.1   < > 92.2  --  91.4  PLT 330   < > 275  --  227   < > = values in this interval not displayed.   Basic Metabolic Panel Recent Labs    09/06/21 1242 09/07/21 0347 09/08/21 0141 09/08/21 1314 09/08/21 1422 09/09/21 0519  NA 136   < > 130*   < > 134* 132*  K 4.1   < > 4.3   < > 4.0 3.6  CL 103   < > 98  --  99 98  CO2 25   < > 21*  --  24 25  GLUCOSE 94   < > 213*  --  108* 114*  BUN 40*   < > 41*  --  40* 40*  CREATININE 1.95*   < > 2.20*  --  2.21* 2.23*  CALCIUM 9.2   < > 8.5*  --  8.9 8.5*  MG 2.2  --  2.1  --   --  1.8  PHOS 3.8  --   --   --   --   --    < > =  values in this interval not displayed.   Liver Function Tests Recent Labs    09/06/21 1242 09/08/21 0141  AST 34 33  ALT 32 28  ALKPHOS 114 93  BILITOT 2.8* 2.5*  PROT 7.1 5.7*  ALBUMIN 3.4* 2.8*   No results for input(s): LIPASE, AMYLASE in the last 72 hours. Cardiac Enzymes No results for input(s): CKTOTAL, CKMB, CKMBINDEX, TROPONINI in the last 72 hours.  BNP: BNP (last 3 results) Recent Labs    08/22/21 0510 09/04/21 1443 09/06/21 1243  BNP 1,593.4* 1,644.2* 1,614.8*    ProBNP (last 3 results) No results for input(s): PROBNP in the last 8760 hours.   D-Dimer No results for input(s): DDIMER in the last 72 hours. Hemoglobin A1C No results for input(s): HGBA1C in the last 72 hours. Fasting Lipid Panel Recent Labs    09/08/21 0141  CHOL 94  HDL 25*  LDLCALC 58  TRIG 54  CHOLHDL 3.8   Thyroid Function Tests Recent Labs    09/06/21 1242 09/07/21 0347 09/08/21 0141  TSH 8.683*  --   --   T3FREE  --    < > 1.9*   < > = values in this interval not displayed.    Other results:   Imaging    CARDIAC CATHETERIZATION  Result Date: 09/08/2021 1. Optimized filling pressures. 2. Adequate PAPI. 3. Discrepant cardiac output readings by Fick (good on milrinone) and  thermodilution (still low on milrinone).  Luiz Blare will stay in place, will follow CO by Fick and thermo going forwards, hopefully will be more concordant in the future.     Medications:     Scheduled Medications:  Chlorhexidine Gluconate Cloth  6 each Topical Daily   midodrine  2.5 mg Oral TID WC   montelukast  5 mg Oral QHS   potassium chloride  20 mEq Oral Daily   rosuvastatin  10 mg Oral Daily   sodium chloride flush  10-40 mL Intracatheter Q12H   sodium chloride flush  3 mL Intravenous Q12H   sodium chloride flush  3 mL Intravenous Q12H   sodium chloride flush  3 mL Intravenous Q12H    Infusions:  sodium chloride 10 mL/hr at 09/09/21 0700   amiodarone 30 mg/hr (09/09/21 0700)   heparin 1,050 Units/hr (09/09/21 0700)   milrinone 0.375 mcg/kg/min (09/09/21 0700)    PRN Medications: sodium chloride, acetaminophen, albuterol, ondansetron (ZOFRAN) IV, sodium chloride flush, sodium chloride flush    Assessment/Plan   1. Acute on chronic systolic CHF:  Long-standing cardiomyopathy.  [place].  Echo this admission with EF < 20%, severe LV dilation, restrictive diastolic function, moderate RV dysfunction, moderate MR, mod-severe TR. Cause of cardiomyopathy is uncertain.  He has a history of CAD, but I do not think that the described CAD from the past could explain his cardiomyopathy, but CAD could have progressed.  With difficulty tolerating GDMT/need for midodrine and cardiorenal syndrome as well as profound volume overload,  concerned for low output HF.  Co-ox off milrinone was 36%, milrinone started and increased to 0.375.   - 12/19 RHC on milrinone 0.375 mcg, stable filling pressures/CO 6 and CI 3 but thermo CO 3.5 CI 1.8  -Todays CVP trending up and CO/CI down.  - Continue Milrinone 0.375 mcg and add Norepi 3 mcg. Check swan numbers around 1200. - Give 40 mg IV lasix twice daily and follow response.  - Maps remain soft. Continue home midodrine.  - Consider Impella 5.5  support depending on findings.  Will  need eventual coronary angiography but in absence of ACS, would wait until creatinine stabilizes. Discussed risks/benefits with patient and he agrees to procedure.  - Will need to try to get him back into NSR (see below).  - He has been  RV pacing frequently which is not ideal, will need to see if this is improved by regaining NSR.  - EP adjust his device to try to decrease RV pacing.  -Possible candidate if renal function improves/stabilizes.  RV looks moderately dysfunctional on my assessment of echo.  2. Atrial fibrillation: Persistent since 10/22 based on device interrogation.  Would benefit from resumption of NSR.  -Remains in A fib.  - Continue amiodarone gtt.  - He is on heparin gtt.  - Will aim to diurese then try to wean milrinone and cardiovert.  3. CKD stage 3: Creatinine 1.99 => 2.23 today, suspect cardiorenal syndrome.  Follow closely with diuresis.  4. Syncope: Prior to admission, stood up then passed out.  ?Orthostatic versus arrhythmic.  He had VT run terminated by ATP that may have been around the time of syncope.   - Now on amiodarone gtt.  5. Elevated TSH/elevated free T4: Mixed picture, ?sick euthyroid.   Free T3 pending.   6. CAD: History of PCI to OM1 in 2007 and RCA in 2013.  No recent chest pain.  - Would aim for coronary angiography once creatinine stabilizes - Continue Crestor.   Will need Impella 5.5 later this week. I updated Dr Abran Duke Team, Thurmond Butts).    Length of Stay: 3  Amy Clegg, NP  09/09/2021, 8:00 AM  Advanced Heart Failure Team Pager [phone] (M-F; 7a - 5p)  Please contact [facility] for night-coverage after hours (5p -7a ) and weekends on [email or web address]  Agree with above  Remains on milrinone 0.375. Tye cath yesterday with adequate cardiac output. Diuresed well overnight. CI down to 1.8 on swan today but co-ox 69% SCr up 2.0 -> 2.2.  Feels ok. Denies CP or SOB. Remains in AF  General:  Sitting up in bed   HEENT: normal Neck: supple. RIJ swan  Carotids 2+ bilat; no bruits. No lymphadenopathy or thryomegaly appreciated. Cor: PMI nondisplaced. Irregular rate & rhythm. No rubs, gallops or murmurs. Lungs: clear Abdomen: soft, nontender, nondistended. No hepatosplenomegaly. No bruits or masses. Good bowel sounds. Extremities: no cyanosis, clubbing, rash, edema Neuro: alert & orientedx3, cranial nerves grossly intact. moves all 4 extremities w/o difficulty. Affect pleasant  Output remains low despite milrinone. Agree with adding NE. Case d/w Drs. Lightfoot and Mclean. Will plan Impella 5.5 tomorrow with eye toward durable VAD in early January if renal function improves.   CRITICAL CARE Performed by: Glori Bickers  Total critical care time: 35 minutes  Critical care time was exclusive of separately billable procedures and treating other patients.  Critical care was necessary to treat or prevent imminent or life-threatening deterioration.  Critical care was time spent personally by me (independent of midlevel providers or residents) on the following activities: development of treatment plan with patient and/or surrogate as well as nursing, discussions with consultants, evaluation of patient's response to treatment, examination of patient, obtaining history from patient or surrogate, ordering and performing treatments and interventions, ordering and review of laboratory studies, ordering and review of radiographic studies, pulse oximetry and re-evaluation of patient's condition.  Glori Bickers, MD  2:03 PM

## 2021-09-10 ENCOUNTER — Encounter (HOSPITAL_COMMUNITY): Payer: Medicare HMO

## 2021-09-10 ENCOUNTER — Inpatient Hospital Stay (HOSPITAL_COMMUNITY): Payer: Medicare HMO

## 2021-09-10 ENCOUNTER — Encounter (HOSPITAL_COMMUNITY)
Admission: EM | Disposition: A | Discharge status: Home Health | Payer: Self-pay | Source: Home / Self Care | Attending: Surgery

## 2021-09-10 ENCOUNTER — Encounter (HOSPITAL_COMMUNITY): Payer: Self-pay | Admitting: Certified Registered Nurse Anesthetist

## 2021-09-10 ENCOUNTER — Other Ambulatory Visit (HOSPITAL_COMMUNITY): Payer: Medicare HMO

## 2021-09-10 DIAGNOSIS — Z7189 Other specified counseling: Secondary | ICD-10-CM

## 2021-09-10 DIAGNOSIS — R55 Syncope and collapse: Secondary | ICD-10-CM

## 2021-09-10 DIAGNOSIS — R579 Shock, unspecified: Secondary | ICD-10-CM

## 2021-09-10 DIAGNOSIS — Z515 Encounter for palliative care: Secondary | ICD-10-CM

## 2021-09-10 DIAGNOSIS — E43 Unspecified severe protein-calorie malnutrition: Secondary | ICD-10-CM | POA: Insufficient documentation

## 2021-09-10 DIAGNOSIS — R7989 Other specified abnormal findings of blood chemistry: Secondary | ICD-10-CM

## 2021-09-10 DIAGNOSIS — R570 Cardiogenic shock: Secondary | ICD-10-CM

## 2021-09-10 DIAGNOSIS — Z452 Encounter for adjustment and management of vascular access device: Secondary | ICD-10-CM

## 2021-09-10 DIAGNOSIS — I5043 Acute on chronic combined systolic (congestive) and diastolic (congestive) heart failure: Secondary | ICD-10-CM

## 2021-09-10 LAB — PULMONARY FUNCTION TEST
DL/VA % pred: 78 %
DL/VA: 3.21 ml/min/mmHg/L
DLCO cor % pred: 59 %
DLCO cor: 15.09 ml/min/mmHg
DLCO unc % pred: 56 %
DLCO unc: 14.37 ml/min/mmHg
FEF 25-75 Post: 2.08 L/sec
FEF 25-75 Pre: 1.36 L/sec
FEF2575-%Change-Post: 52 %
FEF2575-%Pred-Post: 85 %
FEF2575-%Pred-Pre: 55 %
FEV1-%Change-Post: 11 %
FEV1-%Pred-Post: 85 %
FEV1-%Pred-Pre: 76 %
FEV1-Post: 2.4 L
FEV1-Pre: 2.15 L
FEV1FVC-%Change-Post: 8 %
FEV1FVC-%Pred-Pre: 92 %
FEV6-%Change-Post: 4 %
FEV6-%Pred-Post: 87 %
FEV6-%Pred-Pre: 83 %
FEV6-Post: 3.08 L
FEV6-Pre: 2.95 L
FEV6FVC-%Change-Post: 2 %
FEV6FVC-%Pred-Post: 104 %
FEV6FVC-%Pred-Pre: 101 %
FVC-%Change-Post: 2 %
FVC-%Pred-Post: 83 %
FVC-%Pred-Pre: 81 %
FVC-Post: 3.1 L
FVC-Pre: 3.03 L
Post FEV1/FVC ratio: 77 %
Post FEV6/FVC ratio: 99 %
Pre FEV1/FVC ratio: 71 %
Pre FEV6/FVC Ratio: 97 %
RV % pred: 93 %
RV: 2.21 L
TLC % pred: 78 %
TLC: 5.33 L

## 2021-09-10 LAB — URINALYSIS, COMPLETE (UACMP) WITH MICROSCOPIC
Bilirubin Urine: NEGATIVE
Glucose, UA: NEGATIVE mg/dL
Ketones, ur: NEGATIVE mg/dL
Nitrite: NEGATIVE
Protein, ur: NEGATIVE mg/dL
Specific Gravity, Urine: 1.008 (ref 1.005–1.030)
WBC, UA: >50 WBC/hpf — ABNORMAL HIGH (ref 0–5)
pH: 5 (ref 5.0–8.0)

## 2021-09-10 LAB — URINALYSIS, ROUTINE W REFLEX MICROSCOPIC
Bilirubin Urine: NEGATIVE
Glucose, UA: NEGATIVE mg/dL
Ketones, ur: NEGATIVE mg/dL
Nitrite: NEGATIVE
Protein, ur: NEGATIVE mg/dL
Specific Gravity, Urine: 1.008 (ref 1.005–1.030)
WBC, UA: >50 WBC/hpf — ABNORMAL HIGH (ref 0–5)
pH: 5 (ref 5.0–8.0)

## 2021-09-10 LAB — PREALBUMIN: Prealbumin: 7.8 mg/dL — ABNORMAL LOW (ref 18–38)

## 2021-09-10 LAB — LACTATE DEHYDROGENASE: LDH: 188 U/L (ref 98–192)

## 2021-09-10 LAB — CBC
HCT: 38.5 % — ABNORMAL LOW (ref 39.0–52.0)
Hemoglobin: 13 g/dL (ref 13.0–17.0)
MCH: 30.7 pg (ref 26.0–34.0)
MCHC: 33.8 g/dL (ref 30.0–36.0)
MCV: 91 fL (ref 80.0–100.0)
Platelets: 194 10*3/uL (ref 150–400)
RBC: 4.23 MIL/uL (ref 4.22–5.81)
RDW: 16.6 % — ABNORMAL HIGH (ref 11.5–15.5)
WBC: 7.3 10*3/uL (ref 4.0–10.5)
nRBC: 0 % (ref 0.0–0.2)

## 2021-09-10 LAB — HEPATITIS B SURFACE ANTIGEN: Hepatitis B Surface Ag: NONREACTIVE

## 2021-09-10 LAB — HEPARIN LEVEL (UNFRACTIONATED)
Heparin Unfractionated: 0.92 IU/mL — ABNORMAL HIGH (ref 0.30–0.70)
Heparin Unfractionated: 0.83 IU/mL — ABNORMAL HIGH (ref 0.30–0.70)
Heparin Unfractionated: 0.46 IU/mL (ref 0.30–0.70)

## 2021-09-10 LAB — APTT
aPTT: 130 seconds — ABNORMAL HIGH (ref 24–36)
aPTT: 140 seconds — ABNORMAL HIGH (ref 24–36)

## 2021-09-10 LAB — BASIC METABOLIC PANEL
Anion gap: 11 (ref 5–15)
BUN: 33 mg/dL — ABNORMAL HIGH (ref 8–23)
CO2: 24 mmol/L (ref 22–32)
Calcium: 8.4 mg/dL — ABNORMAL LOW (ref 8.9–10.3)
Chloride: 93 mmol/L — ABNORMAL LOW (ref 98–111)
Creatinine, Ser: 2.14 mg/dL — ABNORMAL HIGH (ref 0.61–1.24)
GFR, Estimated: 33 mL/min — ABNORMAL LOW (ref >60)
Glucose, Bld: 118 mg/dL — ABNORMAL HIGH (ref 70–99)
Potassium: 3.9 mmol/L (ref 3.5–5.1)
Sodium: 128 mmol/L — ABNORMAL LOW (ref 135–145)

## 2021-09-10 LAB — COOXEMETRY PANEL
Carboxyhemoglobin: 1.3 % (ref 0.5–1.5)
Methemoglobin: 0.7 % (ref 0.0–1.5)
O2 Saturation: 56.4 %
Total hemoglobin: 13.3 g/dL (ref 12.0–16.0)

## 2021-09-10 LAB — PSA: Prostatic Specific Antigen: 7.22 ng/mL — ABNORMAL HIGH (ref 0.00–4.00)

## 2021-09-10 LAB — HEPATITIS C ANTIBODY: HCV Ab: NONREACTIVE

## 2021-09-10 LAB — HEPATITIS B CORE ANTIBODY, IGM: Hep B C IgM: NONREACTIVE

## 2021-09-10 LAB — URIC ACID: Uric Acid, Serum: 13.3 mg/dL — ABNORMAL HIGH (ref 3.7–8.6)

## 2021-09-10 LAB — T3, FREE: T3, Free: 2.1 pg/mL (ref 2.0–4.4)

## 2021-09-10 LAB — HEPATITIS B SURFACE ANTIBODY,QUALITATIVE: Hep B S Ab: REACTIVE — AB

## 2021-09-10 LAB — PROCALCITONIN: Procalcitonin: 0.28 ng/mL

## 2021-09-10 LAB — T4, FREE: Free T4: 1.69 ng/dL — ABNORMAL HIGH (ref 0.61–1.12)

## 2021-09-10 LAB — ANTITHROMBIN III: AntiThromb III Func: 67 % — ABNORMAL LOW (ref 75–120)

## 2021-09-10 LAB — MAGNESIUM: Magnesium: 1.9 mg/dL (ref 1.7–2.4)

## 2021-09-10 SURGERY — INSERTION, CARDIAC ASSIST DEVICE, IMPELLA
Anesthesia: General | Laterality: Right

## 2021-09-10 MED ORDER — METOLAZONE 2.5 MG PO TABS
2.5000 mg | ORAL_TABLET | Freq: Once | ORAL | Status: AC
  Administered 2021-09-10: 09:00:00 2.5 mg via ORAL
  Filled 2021-09-10: qty 1

## 2021-09-10 MED ORDER — VANCOMYCIN HCL 1500 MG/300ML IV SOLN
1500.0000 mg | INTRAVENOUS | Status: DC
  Filled 2021-09-10: qty 300

## 2021-09-10 MED ORDER — VANCOMYCIN HCL 1500 MG/300ML IV SOLN
1500.0000 mg | Freq: Once | INTRAVENOUS | Status: AC
  Administered 2021-09-10: 11:00:00 1500 mg via INTRAVENOUS
  Filled 2021-09-10: qty 300

## 2021-09-10 MED ORDER — ALBUTEROL SULFATE (2.5 MG/3ML) 0.083% IN NEBU
2.5000 mg | INHALATION_SOLUTION | Freq: Once | RESPIRATORY_TRACT | Status: AC
  Administered 2021-09-10: 15:00:00 2.5 mg via RESPIRATORY_TRACT

## 2021-09-10 MED ORDER — ENSURE ENLIVE PO LIQD
237.0000 mL | Freq: Three times a day (TID) | ORAL | Status: DC
  Administered 2021-09-10 – 2021-09-16 (×11): 237 mL via ORAL

## 2021-09-10 MED ORDER — SODIUM CHLORIDE 0.9 % IV SOLN
2.0000 g | Freq: Two times a day (BID) | INTRAVENOUS | Status: DC
  Administered 2021-09-10 (×2): 2 g via INTRAVENOUS
  Filled 2021-09-10 (×2): qty 2

## 2021-09-10 MED ORDER — ADULT MULTIVITAMIN W/MINERALS CH
1.0000 | ORAL_TABLET | Freq: Every day | ORAL | Status: DC
  Administered 2021-09-10 – 2021-09-16 (×6): 1 via ORAL
  Filled 2021-09-10 (×6): qty 1

## 2021-09-10 MED ORDER — FUROSEMIDE 10 MG/ML IJ SOLN
80.0000 mg | Freq: Two times a day (BID) | INTRAMUSCULAR | Status: DC
  Administered 2021-09-10: 18:00:00 80 mg via INTRAVENOUS
  Filled 2021-09-10 (×2): qty 8

## 2021-09-10 MED ORDER — FUROSEMIDE 10 MG/ML IJ SOLN
40.0000 mg | Freq: Once | INTRAMUSCULAR | Status: AC
  Administered 2021-09-10: 09:00:00 40 mg via INTRAVENOUS
  Filled 2021-09-10: qty 4

## 2021-09-10 NOTE — Progress Notes (Signed)
Physical Therapy Treatment Patient Details Name: Isaac [name] MRN: [id number] DOB: 1951/05/08 Today's Date: 09/10/2021   History of Present Illness Pt is a 70 y.o. male admitted 09/07/21 with near-syncope, nausea, poor oral intake, generalized weakness. Workup for advanced heart failure and persistent inotropic with requirement, cardiogenic shock; potential destination LVAD. [place] 12/19. Pt scheduled for Impella 12/21, cancelled due to fever. PMH includes advanced CHF (LVEF <20%), AICD/PPM, PAF on Eliquis, CKD 3, HTN, HLD.   PT Comments    Pt progressing with mobility. Today's session focused on ambulation and therex for improving strength and activity tolerance; pt moving well with intermittent min guard for balance. Noted chills this session, as well as nausea/vomiting post-walk (RN aware). Will continue to follow acutely to address established goals.  HR 100s SpO2 98% on RA BP 106/70    Recommendations for follow up therapy are one component of a multi-disciplinary discharge planning process, led by the attending physician.  Recommendations may be updated based on patient status, additional functional criteria and insurance authorization.  Follow Up Recommendations  Other (comment) (HHPT vs. cardiac rehab phase II)     Assistance Recommended at Discharge PRN  Equipment Recommendations   (TBD)    Recommendations for Other Services       Precautions / Restrictions Precautions Precautions: Fall;Other (comment) Precaution Comments: chills w/ nausea/vomiting Restrictions Weight Bearing Restrictions: No     Mobility  Bed Mobility               General bed mobility comments: Received sitting in recliner    Transfers Overall transfer level: Needs assistance Equipment used: None Transfers: Sit to/from Stand Sit to Stand: Supervision           General transfer comment: Repeated sit<>stands without UE support, supervision for lines     Ambulation/Gait Ambulation/Gait assistance: Supervision;Min guard Gait Distance (Feet): 390 Feet Assistive device: None Gait Pattern/deviations: Step-through pattern;Decreased stride length;Staggering right Gait velocity: Decreased     General Gait Details: Slow, mostly steady gait without DME; pt with 3x self-corrected instability staggering to one side, suspect related to slowed gait speed with increased SLS time. Stability improving with distance. Pt with nausea/vomiting after ambulation   Stairs             Wheelchair Mobility    Modified Rankin (Stroke Patients Only)       Balance Overall balance assessment: Needs assistance Sitting-balance support: No upper extremity supported;Feet supported Sitting balance-Leahy Scale: Good     Standing balance support: No upper extremity supported;During functional activity Standing balance-Leahy Scale: Good                              Cognition Arousal/Alertness: Awake/alert Behavior During Therapy: WFL for tasks assessed/performed Overall Cognitive Status: Within Functional Limits for tasks assessed                                          Exercises      General Comments General comments (skin integrity, edema, etc.): Pt's wife present and supportive. Pt endorses increased chills (MD present and aware)      Pertinent Vitals/Pain Pain Assessment: No/denies pain    Home Living                          Prior Function  PT Goals (current goals can now be found in the care plan section) Progress towards PT goals: Progressing toward goals    Frequency    Min 3X/week      PT Plan Current plan remains appropriate    Co-evaluation              AM-PAC PT "6 Clicks" Mobility   Outcome Measure  Help needed turning from your back to your side while in a flat bed without using bedrails?: None Help needed moving from lying on your back to sitting on  the side of a flat bed without using bedrails?: None Help needed moving to and from a bed to a chair (including a wheelchair)?: None Help needed standing up from a chair using your arms (e.g., wheelchair or bedside chair)?: None Help needed to walk in hospital room?: A Little Help needed climbing 3-5 steps with a railing? : A Little 6 Click Score: 22    End of Session   Activity Tolerance: Patient tolerated treatment well;Treatment limited secondary to medical complications (Comment) (chills, nausea/vomiting) Patient left: in chair;with call bell/phone within reach;with nursing/sitter in room;with family/visitor present Nurse Communication: Mobility status PT Visit Diagnosis: Unsteadiness on feet (R26.81);Other abnormalities of gait and mobility (R26.89)     Time: 2542-7062 PT Time Calculation (min) (ACUTE ONLY): 24 min  Charges:  $Therapeutic Exercise: 23-37 mins                     Mabeline Caras, PT, DPT Acute Rehabilitation Services  Pager [phone] Office [place] 09/10/2021, 5:30 PM

## 2021-09-10 NOTE — Progress Notes (Signed)
ANTICOAGULATION CONSULT NOTE   Pharmacy Consult for Heparin Indication: atrial fibrillation  Allergies  Allergen Reactions   Mushroom Extract Complex Nausea And Vomiting   Neosporin [Neomycin-Bacitracin Zn-Polymyx] Hives    Patient Measurements: Height: 5\' 9"  (175.3 cm) Weight: 74 kg (163 lb 2.3 oz) IBW/kg (Calculated) : 70.7 Heparin Dosing Weight: 81.6 kg  Vital Signs: Temp: 101.7 F (38.7 C) (12/21 0806) Temp Source: Oral (12/21 0806) BP: 105/91 (12/21 1000) Pulse Rate: 101 (12/21 1000)  Labs: Recent Labs    09/08/21 0141 09/08/21 0340 09/08/21 1314 09/08/21 1421 09/08/21 1422 09/09/21 0519 09/09/21 1342 09/10/21 0137 09/10/21 0318  HGB 12.9*  --  14.6   14.6  --   --  12.2*  --   --  13.0  HCT 38.8*  --  43.0   43.0  --   --  37.2*  --   --  38.5*  PLT 275  --   --   --   --  227  --   --  194  APTT >200*   < >  --   --  41* 38* 114* 130*  --   HEPARINUNFRC >1.10*   < >  --    < >  --  0.76* 0.97* 0.92*  --   CREATININE 2.20*  --   --   --  2.21* 2.23*  --   --  2.14*   < > = values in this interval not displayed.     Estimated Creatinine Clearance: 32.6 mL/min (A) (by C-G formula based on SCr of 2.14 mg/dL (H)).   Medical History: Past Medical History:  Diagnosis Date   Arrhythmia    atrial fibrillation   CHF (congestive heart failure) (HCC)    Chronic kidney disease    Coronary artery disease    Hyperlipidemia    Hypertension    Myocardial infarct (HCC)     Medications:  Medications Prior to Admission  Medication Sig Dispense Refill Last Dose   albuterol (VENTOLIN HFA) 108 (90 Base) MCG/ACT inhaler Inhale 2 puffs into the lungs every 6 (six) hours as needed.   unknown   apixaban (ELIQUIS) 5 MG TABS tablet Take 1 tablet (5 mg total) by mouth 2 (two) times daily. 60 tablet 2 09/06/2021 at 0500   furosemide (LASIX) 20 MG tablet Take 1 tablet (20 mg total) by mouth daily as needed for edema or fluid (sob). (Patient taking differently: Take 20 mg by  mouth daily.) 90 tablet 0 Past Week   midodrine (PROAMATINE) 2.5 MG tablet Take 1 tablet (2.5 mg total) by mouth 3 (three) times daily with meals. 90 tablet 2 09/06/2021   amiodarone (PACERONE) 200 MG tablet Take 1 tablet (200 mg total) by mouth 2 (two) times daily. (Patient not taking: Reported on 08/29/2021) 60 tablet 2 Not Taking   metoprolol tartrate (LOPRESSOR) 25 MG tablet Take 0.5 tablets (12.5 mg total) by mouth 2 (two) times daily. (Patient not taking: Reported on 08/29/2021) 30 tablet 2 Not Taking   montelukast (SINGULAIR) 10 MG tablet Take by mouth. (Patient not taking: Reported on 09/06/2021)   Not Taking   spironolactone (ALDACTONE) 25 MG tablet Take 0.5 tablets (12.5 mg total) by mouth daily. (Patient not taking: Reported on 08/29/2021) 15 tablet 2 Not Taking   tamsulosin (FLOMAX) 0.4 MG CAPS capsule TAKE 1 CAPSULE (0.4 MG TOTAL) BY MOUTH DAILY AFTER BREAKFAST. (Patient not taking: Reported on 08/29/2021) 30 capsule 0 Not Taking    Scheduled:   Chlorhexidine Gluconate  Cloth  6 each Topical Daily   furosemide  80 mg Intravenous BID   montelukast  5 mg Oral QHS   potassium chloride  40 mEq Oral BID   rosuvastatin  10 mg Oral Daily   senna-docusate  2 tablet Oral QHS   sodium chloride flush  10-40 mL Intracatheter Q12H   sodium chloride flush  3 mL Intravenous Q12H   sodium chloride flush  3 mL Intravenous Q12H   sodium chloride flush  3 mL Intravenous Q12H   Infusions:   sodium chloride 250 mL (09/10/21 1005)   amiodarone 60 mg/hr (09/10/21 0900)   ceFEPime (MAXIPIME) IV 2 g (09/10/21 1006)   heparin 800 Units/hr (09/10/21 0900)   milrinone 0.375 mcg/kg/min (09/10/21 0900)   norepinephrine (LEVOPHED) Adult infusion 3 mcg/min (09/10/21 0900)   vancomycin 1,500 mg (09/10/21 1119)   [START ON 09/12/2021] vancomycin     PRN: sodium chloride, acetaminophen, albuterol, ondansetron (ZOFRAN) IV, sodium chloride flush, sodium chloride flush  Assessment: 52 yom with a history of stage  of systolic CHF chronic LVEF less than 20%, status post AICD/PPM, PAF on Eliquis, CKD stage IIIb, HTN, HLD. Patient is presenting with weakness, near syncope, and poor oral intake. Heparin per pharmacy consult placed for atrial fibrillation in preparation for RHC on Monday.  Patient is on apixaban prior to arrival. Last dose ~4am on 12/17. Aptt and heparin level appear to correlate. Heparin level 0.8 and aptt also elevated at 140s.   Goal of Therapy:  Heparin level 0.3-0.7 units/ml aPTT 66-102 seconds Monitor platelets by anticoagulation protocol: Yes   Plan:  Dec heparin to 600 units/hr Check anti-Xa level in 8 hours and daily while on heparin  Erin Hearing PharmD., BCPS Clinical Pharmacist 09/10/2021 11:21 AM

## 2021-09-10 NOTE — Progress Notes (Signed)
Initial Nutrition Assessment  DOCUMENTATION CODES:   Severe malnutrition in context of chronic illness  INTERVENTION:   Ensure Enlive po TID, each supplement provides 350 kcal and 20 grams of protein  Add vegetarian preference to diet order  Add MVI with Minerals  NUTRITION DIAGNOSIS:   Severe Malnutrition related to chronic illness (heart failure) as evidenced by severe fat depletion, severe muscle depletion.  GOAL:   Patient will meet greater than or equal to 90% of their needs  MONITOR:   PO intake, Supplement acceptance, Labs, Weight trends  REASON FOR ASSESSMENT:   Consult LVAD Eval  ASSESSMENT:   70 yo male admitted with acute on chronic CHF. PMH includes CHF, CAD, CKD, HLD, HTN  Evaluation for LVAD, destination therapy  Febrile this AM, Impella scheduled for today now on hold Reamins on levophed, milrinone, amiodarone drips +nausea  Pt reports fair appetite. Pt reports he is a vegetarian, does eat eggs and some fish, not much dairy. Pt reports eating 2 meals per day at home, no snacks. Pt reports a typical meal might be a wrap with hummus and veggies. Pt is agreeable to Ensure Enlive protein drink to optimize nutritional status.   Pt reports he had been weighing around 175 pounds but this is with fluid onboard. Pt does not know his dry weight. Current wt 163 pounds. Edema still present on exam  Labs: sodium 128 (L) Meds: lasix, senokot, KCl  NUTRITION - FOCUSED PHYSICAL EXAM:  Flowsheet Row Most Recent Value  Orbital Region Moderate depletion  Upper Arm Region Severe depletion  Thoracic and Lumbar Region Severe depletion  Buccal Region Moderate depletion  Temple Region Moderate depletion  Clavicle Bone Region Severe depletion  Clavicle and Acromion Bone Region Severe depletion  Scapular Bone Region Severe depletion  Dorsal Hand Mild depletion  Patellar Region Moderate depletion  Anterior Thigh Region Moderate depletion  Posterior Calf Region Mild  depletion  Edema (RD Assessment) Mild       Diet Order:   Diet Order             Diet Heart Room service appropriate? Yes; Fluid consistency: Thin  Diet effective now                   EDUCATION NEEDS:   Education needs have been addressed  Skin:  Skin Assessment: Reviewed RN Assessment  Last BM:  12/17  Height:   Ht Readings from Last 1 Encounters:  09/07/21 5\' 9"  (1.753 m)    Weight:   Wt Readings from Last 1 Encounters:  09/10/21 74 kg    BMI:  Body mass index is 24.09 kg/m.  Estimated Nutritional Needs:   Kcal:  2300-2500 kcals  Protein:  115-130 g  Fluid:  >/= 2 L    Kerman Passey MS, RDN, LDN, CNSC Registered Dietitian III Clinical Nutrition RD Pager and On-Call Pager Number Located in [place]

## 2021-09-10 NOTE — Progress Notes (Signed)
Occupational Therapy Treatment Patient Details Name: Isaac [name] MRN: [id number] DOB: 01-11-1951 Today's Date: 09/10/2021   History of present illness Isaac [name] is a 70 y.o. male came with repeated near syncope, nausea poor intake and generalized weakness;  with medical history significant of advanced stage of systolic CHF chronic LVEF less than 20%, status post AICD/PPM, PAF on Eliquis, CKD stage IIIb, HTN, HLD   OT comments  Pt seen for first OT session since transfer to ICU without change in functional status. Pt able to mobilize without AD with min guard for safety and complete ADLs with no more than Supervision during session. No overt LOB, dizziness or safety concerns noted. Educated re: energy conservation strategies pending cardiac procedures, as well as precautions for pending impella placement. DC recs remain appropriate.   HR up to 115bpm BP sustained 90s/60s Spo2 >96% on RA   Recommendations for follow up therapy are one component of a multi-disciplinary discharge planning process, led by the attending physician.  Recommendations may be updated based on patient status, additional functional criteria and insurance authorization.    Follow Up Recommendations  No OT follow up    Assistance Recommended at Discharge Intermittent Supervision/Assistance  Equipment Recommendations  Tub/shower seat    Recommendations for Other Services      Precautions / Restrictions Precautions Precautions: Fall Precaution Comments: soft BP Restrictions Weight Bearing Restrictions: No       Mobility Bed Mobility Overal bed mobility: Modified Independent Bed Mobility: Supine to Sit           General bed mobility comments: assist for lines only though pt also assisting with lines    Transfers Overall transfer level: Needs assistance Equipment used: None Transfers: Sit to/from Stand Sit to Stand: Supervision                 Balance Overall balance  assessment: Needs assistance Sitting-balance support: No upper extremity supported;Feet supported Sitting balance-Leahy Scale: Good     Standing balance support: No upper extremity supported;During functional activity Standing balance-Leahy Scale: Good                             ADL either performed or assessed with clinical judgement   ADL Overall ADL's : Needs assistance/impaired     Grooming: Set up;Standing;Oral care Grooming Details (indicate cue type and reason): no LOB, able to stand without support             Lower Body Dressing: Supervision/safety;Sit to/from stand Lower Body Dressing Details (indicate cue type and reason): able to demo flexibility to reach B socks without pain/issue             Functional mobility during ADLs: Min guard General ADL Comments: Educated on energy conservation and precautions following impella and pending LVAD in future. Denies dizziness, fatigue or pain    Extremity/Trunk Assessment Upper Extremity Assessment Upper Extremity Assessment: Overall WFL for tasks assessed   Lower Extremity Assessment Lower Extremity Assessment: Defer to PT evaluation        Vision   Vision Assessment?: No apparent visual deficits   Perception     Praxis      Cognition Arousal/Alertness: Awake/alert Behavior During Therapy: WFL for tasks assessed/performed Overall Cognitive Status: Within Functional Limits for tasks assessed  Exercises     Shoulder Instructions       General Comments      Pertinent Vitals/ Pain       Pain Assessment: No/denies pain  Home Living                                          Prior Functioning/Environment              Frequency  Min 2X/week        Progress Toward Goals  OT Goals(current goals can now be found in the care plan section)  Progress towards OT goals: Progressing toward goals  Acute  Rehab OT Goals Patient Stated Goal: recover well OT Goal Formulation: With patient Time For Goal Achievement: 09/21/21 Potential to Achieve Goals: Good ADL Goals Pt Will Perform Lower Body Bathing: with modified independence;sit to/from stand Pt Will Perform Lower Body Dressing: with modified independence;sit to/from stand Pt Will Transfer to Toilet: with modified independence;ambulating Additional ADL Goal #1: Pt will tolerate at least 8 minutes of OOB functional task without reast break to incrased activity tolerance Additional ADL Goal #2: pt will indep verbalize at least 2 energy conservation strategies to apply at d/c.  Plan Discharge plan remains appropriate    Co-evaluation                 AM-PAC OT "6 Clicks" Daily Activity     Outcome Measure   Help from another person eating meals?: None Help from another person taking care of personal grooming?: A Little Help from another person toileting, which includes using toliet, bedpan, or urinal?: A Little Help from another person bathing (including washing, rinsing, drying)?: A Little Help from another person to put on and taking off regular upper body clothing?: A Little Help from another person to put on and taking off regular lower body clothing?: A Little 6 Click Score: 19    End of Session Equipment Utilized During Treatment: Gait belt  OT Visit Diagnosis: Unsteadiness on feet (R26.81);Other abnormalities of gait and mobility (R26.89);Muscle weakness (generalized) (M62.81)   Activity Tolerance Patient tolerated treatment well   Patient Left in chair;with call bell/phone within reach   Nurse Communication Mobility status        Time: 2536-6440 OT Time Calculation (min): 27 min  Charges: OT General Charges $OT Visit: 1 Visit OT Treatments $Self Care/Home Management : 8-22 mins $Therapeutic Activity: 8-22 mins  Malachy Chamber, OTR/L Acute Rehab Services Office: [phone]   Layla Maw 09/10/2021, 12:47  PM

## 2021-09-10 NOTE — Consult Note (Signed)
Consultation Note Date: 09/10/2021   Patient Name: Isaac [name]  DOB: Jun 08, 1951  MRN: [id number]  Age / Sex: 70 y.o., male  PCP: [name], MD Referring Physician: Larey Dresser, MD  Reason for Consultation: VAD evaluation  HPI/Patient Profile: 70 y.o. 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 Poor improvement with optimizing heart support in ICU. Plans for Impella placement and LVAD evaluation in process. Concerns for renal function and RV function for potential LVAD.   Clinical Assessment and Goals of Care: I met today with [name] but no family at bedside. Unfortunately [name] has had fever which has changed plan for Impella placement today and he is disappointed by this setback but understanding of why this has to wait. He speaks of his wife, 1 daughter, 3 stepsons, and multiple grandchildren with a smile. He enjoys photography in his retirement. He verifies his desire to move forward with LVAD noting - "I don't have much of a choice, do I?" He is anxious to move forward. We discussed expectations, complications, and basics of LVAD procedure and recovery. We discussed common complications and difficulties of LVAD recovery process.   He wants more time to spend with his family. He values his quality of life. He talks of the death of his sister recently and her desire NOT to pursue life prolonging interventions. He shares that if his time were to come and there is no hope for improvement he would not desire life prolonging measures. I encouraged that these are the important conversations to have with his wife and encouraged them to keep this an open conversation as their views,  experiences, and wishes change over time. I encouraged him to consider completion of Advance Directive and he agrees to look over so I provided him a copy to review and discuss with his wife. We did discuss the importance of considering interventions such as dialysis and if this is anything he would ever want and he is unsure. Explained that most important is that we understand and are able to make these decisions based on the expectations that interventions we provide will get him to a quality of life he would find acceptable.   All questions/concerns addressed. Emotional support provided.   Primary Decision Maker PATIENT    SUMMARY OF RECOMMENDATIONS   - Hopeful to proceed with LVAD placement  - Good support available - Reviewing Advance Directive  Code Status/Advance Care Planning: Full code   Symptom Management:  Per heart failure team.   Prognosis:  Pending advanced therapy intervention and tolerance.   Discharge Planning: To Be Determined      Primary Diagnoses: Present on Admission:  Syncope  CAD (coronary artery disease)  A-fib ([place])   I have reviewed the medical record, interviewed the patient and family, and examined the patient. The  following aspects are pertinent.  Past Medical History:  Diagnosis Date   Arrhythmia    atrial fibrillation   CHF (congestive heart failure) (HCC)    Chronic kidney disease    Coronary artery disease    Hyperlipidemia    Hypertension    Myocardial infarct [facility])    Social History   Socioeconomic History   Marital status: Married    Spouse name: Not on file   Number of children: Not on file   Years of education: Not on file   Highest education level: Not on file  Occupational History   Not on file  Tobacco Use   Smoking status: Former   Smokeless tobacco: Never  Substance and Sexual Activity   Alcohol use: No   Drug use: No   Sexual activity: Not on file  Other Topics Concern   Not on file  Social History  Narrative   Not on file   Social Determinants of Health   Financial Resource Strain: Not on file  Food Insecurity: Not on file  Transportation Needs: Not on file  Physical Activity: Not on file  Stress: Not on file  Social Connections: Not on file   No family history on file. Scheduled Meds:  Chlorhexidine Gluconate Cloth  6 each Topical Daily   feeding supplement  237 mL Oral TID BM   furosemide  80 mg Intravenous BID   montelukast  5 mg Oral QHS   multivitamin with minerals  1 tablet Oral Daily   potassium chloride  40 mEq Oral BID   rosuvastatin  10 mg Oral Daily   senna-docusate  2 tablet Oral QHS   sodium chloride flush  10-40 mL Intracatheter Q12H   sodium chloride flush  3 mL Intravenous Q12H   sodium chloride flush  3 mL Intravenous Q12H   sodium chloride flush  3 mL Intravenous Q12H   Continuous Infusions:  sodium chloride 5 mL/hr at 09/10/21 1100   amiodarone 60 mg/hr (09/10/21 1200)   ceFEPime (MAXIPIME) IV Stopped (09/10/21 1038)   heparin 800 Units/hr (09/10/21 1200)   milrinone 0.375 mcg/kg/min (09/10/21 1200)   norepinephrine (LEVOPHED) Adult infusion 3 mcg/min (09/10/21 1200)   [START ON 09/12/2021] vancomycin     PRN Meds:.sodium chloride, acetaminophen, albuterol, ondansetron (ZOFRAN) IV, sodium chloride flush, sodium chloride flush Allergies  Allergen Reactions   Mushroom Extract Complex Nausea And Vomiting   Neosporin [Neomycin-Bacitracin Zn-Polymyx] Hives   Review of Systems  Constitutional:  Positive for activity change, appetite change and fatigue.  Respiratory:  Negative for shortness of breath.   Gastrointestinal:  Negative for constipation.   Physical Exam Vitals and nursing note reviewed.  Constitutional:      General: He is not in acute distress. Cardiovascular:     Rate and Rhythm: Tachycardia present.  Pulmonary:     Effort: No tachypnea, accessory muscle usage or respiratory distress.  Abdominal:     General: Abdomen is flat.   Neurological:     Mental Status: He is alert and oriented to person, place, and time.  Psychiatric:        Mood and Affect: Affect is flat.    Vital Signs: BP 95/67 (BP Location: Left Arm)    Pulse (!) 108    Temp 98.3 F (36.8 C) (Axillary)    Resp 19    Ht 5' 9" (1.753 m)    Wt 74 kg    SpO2 93%    BMI 24.09 kg/m  Pain Scale: 0-10 POSS *See  Group Information*: S-Acceptable,Sleep, easy to arouse Pain Score: 0-No pain   SpO2: SpO2: 93 % O2 Device:SpO2: 93 % O2 Flow Rate: .   IO: Intake/output summary:  Intake/Output Summary (Last 24 hours) at 09/10/2021 1326 Last data filed at 09/10/2021 1200 Gross per 24 hour  Intake 2433.68 ml  Output 2500 ml  Net -66.32 ml    LBM: Last BM Date: 09/06/21 Baseline Weight: Weight: 78.2 kg Most recent weight: Weight: 74 kg     Palliative Assessment/Data:     Time Total: 50 min  Greater than 50%  of this time was spent counseling and coordinating care related to the above assessment and plan.  Signed by: Vinie Sill, NP Palliative Medicine Team Pager # [phone] (M-F 8a-5p) Team Phone # [phone] (Nights/Weekends)

## 2021-09-10 NOTE — Progress Notes (Signed)
ANTICOAGULATION CONSULT NOTE   Pharmacy Consult for Heparin Indication: atrial fibrillation  Allergies  Allergen Reactions   Mushroom Extract Complex Nausea And Vomiting   Neosporin [Neomycin-Bacitracin Zn-Polymyx] Hives    Patient Measurements: Height: 5\' 9"  (175.3 cm) Weight: 74.1 kg (163 lb 5.8 oz) (weighed x 2) IBW/kg (Calculated) : 70.7 Heparin Dosing Weight: 81.6 kg  Vital Signs: Temp: 98.7 F (37.1 C) (12/21 0000) Temp Source: Oral (12/21 0000) BP: 102/78 (12/21 0300) Pulse Rate: 89 (12/21 0300)  Labs: Recent Labs    09/07/21 1140 09/07/21 1456 09/08/21 0141 09/08/21 0340 09/08/21 1314 09/08/21 1421 09/08/21 1422 09/09/21 0519 09/09/21 1342 09/10/21 0137  HGB 15.1  --  12.9*  --  14.6   14.6  --   --  12.2*  --   --   HCT 45.4  --  38.8*  --  43.0   43.0  --   --  37.2*  --   --   PLT 326  --  275  --   --   --   --  227  --   --   APTT 128*   < > >200*   < >  --   --  41* 38* 114* 130*  HEPARINUNFRC >1.10*  --  >1.10*   < >  --    < >  --  0.76* 0.97* 0.92*  CREATININE  --   --  2.20*  --   --   --  2.21* 2.23*  --   --    < > = values in this interval not displayed.     Estimated Creatinine Clearance: 31.3 mL/min (A) (by C-G formula based on SCr of 2.23 mg/dL (H)).   Medical History: Past Medical History:  Diagnosis Date   Arrhythmia    atrial fibrillation   CHF (congestive heart failure) (HCC)    Chronic kidney disease    Coronary artery disease    Hyperlipidemia    Hypertension    Myocardial infarct (HCC)     Medications:  Medications Prior to Admission  Medication Sig Dispense Refill Last Dose   albuterol (VENTOLIN HFA) 108 (90 Base) MCG/ACT inhaler Inhale 2 puffs into the lungs every 6 (six) hours as needed.   unknown   apixaban (ELIQUIS) 5 MG TABS tablet Take 1 tablet (5 mg total) by mouth 2 (two) times daily. 60 tablet 2 09/06/2021 at 0500   furosemide (LASIX) 20 MG tablet Take 1 tablet (20 mg total) by mouth daily as needed for edema  or fluid (sob). (Patient taking differently: Take 20 mg by mouth daily.) 90 tablet 0 Past Week   midodrine (PROAMATINE) 2.5 MG tablet Take 1 tablet (2.5 mg total) by mouth 3 (three) times daily with meals. 90 tablet 2 09/06/2021   amiodarone (PACERONE) 200 MG tablet Take 1 tablet (200 mg total) by mouth 2 (two) times daily. (Patient not taking: Reported on 08/29/2021) 60 tablet 2 Not Taking   metoprolol tartrate (LOPRESSOR) 25 MG tablet Take 0.5 tablets (12.5 mg total) by mouth 2 (two) times daily. (Patient not taking: Reported on 08/29/2021) 30 tablet 2 Not Taking   montelukast (SINGULAIR) 10 MG tablet Take by mouth. (Patient not taking: Reported on 09/06/2021)   Not Taking   spironolactone (ALDACTONE) 25 MG tablet Take 0.5 tablets (12.5 mg total) by mouth daily. (Patient not taking: Reported on 08/29/2021) 15 tablet 2 Not Taking   tamsulosin (FLOMAX) 0.4 MG CAPS capsule TAKE 1 CAPSULE (0.4 MG TOTAL) BY MOUTH  DAILY AFTER BREAKFAST. (Patient not taking: Reported on 08/29/2021) 30 capsule 0 Not Taking    Scheduled:   Chlorhexidine Gluconate Cloth  6 each Topical Daily   furosemide  40 mg Intravenous BID   montelukast  5 mg Oral QHS   potassium chloride  40 mEq Oral BID   rosuvastatin  10 mg Oral Daily   senna-docusate  2 tablet Oral QHS   sodium chloride flush  10-40 mL Intracatheter Q12H   sodium chloride flush  3 mL Intravenous Q12H   sodium chloride flush  3 mL Intravenous Q12H   sodium chloride flush  3 mL Intravenous Q12H   Infusions:   sodium chloride Stopped (09/09/21 1718)   amiodarone 60 mg/hr (09/10/21 0300)   heparin 900 Units/hr (09/10/21 0300)   milrinone 0.375 mcg/kg/min (09/10/21 0300)   norepinephrine (LEVOPHED) Adult infusion 3 mcg/min (09/10/21 0300)   PRN: sodium chloride, acetaminophen, albuterol, ondansetron (ZOFRAN) IV, sodium chloride flush, sodium chloride flush  Assessment: 60 yom with a history of stage of systolic CHF chronic LVEF less than 20%, status post  AICD/PPM, PAF on Eliquis, CKD stage IIIb, HTN, HLD. Patient is presenting with weakness, near syncope, and poor oral intake. Heparin per pharmacy consult placed for atrial fibrillation in preparation for RHC on Monday.  Patient is on apixaban prior to arrival. Last dose ~4am on 12/17. Will require aPTT monitoring due to likely falsely high anti-Xa level secondary to DOAC use.  Hgb 15.4; plt 330  12/21 AM update:  aPTT elevated  Goal of Therapy:  Heparin level 0.3-0.7 units/ml aPTT 66-102 seconds Monitor platelets by anticoagulation protocol: Yes   Plan:  Dec heparin to 800 units/hr Check aPTT & anti-Xa level in 8 hours and daily while on heparin Continue to monitor via aPTT until levels are correlated Continue to monitor H&H and platelets  Narda Bonds, PharmD, BCPS Clinical Pharmacist Phone: [phone]

## 2021-09-10 NOTE — Progress Notes (Signed)
Pharmacy Antibiotic Note  Isaac [name] is a 70 y.o. male admitted on 09/06/2021 with  weakness and syncope . Patient was scheduled for Impella placement this afternoon but fever noted this morning. Procedure cancelled, will panculture and start broad antibiotics. Pharmacy has been consulted for vancomycin and cefepime dosing.  Fever of 101.7, wbc normal at 7. Scr lightly improved at 2.14.   Vancomycin 1500 mg IV Q 48 hrs. Goal AUC 400-550. Expected AUC: 447 SCr used: 2.14   Plan: Cefepime 2g iv q12 hours Vancomycin 1500 mg IV q48 hours  Height: 5\' 9"  (175.3 cm) Weight: 74 kg (163 lb 2.3 oz) IBW/kg (Calculated) : 70.7  Temp (24hrs), Avg:99.6 F (37.6 C), Min:98.2 F (36.8 C), Max:101.7 F (38.7 C)  Recent Labs  Lab 09/06/21 1242 09/07/21 0347 09/07/21 1140 09/07/21 2014 09/08/21 0141 09/08/21 1422 09/09/21 0519 09/10/21 0318  WBC 6.7  --  6.2  --  4.9  --  7.3 7.3  CREATININE 1.95* 1.99*  --   --  2.20* 2.21* 2.23* 2.14*  LATICACIDVEN  --   --   --  2.2* 1.6  --   --   --     Estimated Creatinine Clearance: 32.6 mL/min (A) (by C-G formula based on SCr of 2.14 mg/dL (H)).    Allergies  Allergen Reactions   Mushroom Extract Complex Nausea And Vomiting   Neosporin [Neomycin-Bacitracin Zn-Polymyx] Hives   Thank you for allowing pharmacy to be a part of this patients care.  Erin Hearing PharmD., BCPS Clinical Pharmacist 09/10/2021 11:13 AM

## 2021-09-10 NOTE — Care Management (Signed)
°  Transition of Care [facility]) Screening Note   Patient Details  Name: Isaac [name] Date of Birth: 01-16-1951   [facility] CM/SW Contact:    Carles Collet, RN Phone Number: 09/10/2021, 3:25 PM    Transition of Care Department [facility]) has reviewed patient We will continue to monitor patient advancement through interdisciplinary progression rounds. If new patient transition needs arise, please place a TOC consult.

## 2021-09-10 NOTE — Plan of Care (Signed)
°  Problem: Clinical Measurements: °Goal: Respiratory complications will improve °Outcome: Progressing °Goal: Cardiovascular complication will be avoided °Outcome: Progressing °  °Problem: Activity: °Goal: Risk for activity intolerance will decrease °Outcome: Progressing °  °Problem: Nutrition: °Goal: Adequate nutrition will be maintained °Outcome: Progressing °  °Problem: Pain Managment: °Goal: General experience of comfort will improve °Outcome: Progressing °  °

## 2021-09-10 NOTE — Progress Notes (Addendum)
Patient ID: Isaac [name], male   DOB: [birth year], 70 y.o.   MRN: [id number]     Advanced Heart Failure Rounding Note  PCP-Cardiologist: None   Subjective:   12/19 RHC- RA 7, PA 39/14 (25), PCWP 11, CO 6.4 CI 3. Thermo 3.5 1.8. Lasix drip stopped after cath. 12/20 Swan removed.  Norepi 3 mcg added.   Out 5L overnight. Weight unchanged.   Remains on milrinone 0.375 mcg + norepi 3 mcg + amiodarone drip.  CO-OX 56%   CVP up to 16 Creatinine 2.14  Fever this morning 101.7  Complaining nausea.    Objective:   Weight Range: 74 kg Body mass index is 24.09 kg/m.   Vital Signs:   Temp:  [98.2 F (36.8 C)-101.7 F (38.7 C)] 101.7 F (38.7 C) (12/21 0806) Pulse Rate:  [85-119] 103 (12/21 0700) Resp:  [13-25] 16 (12/21 0700) BP: (88-121)/(62-94) 114/76 (12/21 0700) SpO2:  [92 %-100 %] 96 % (12/21 0700) Weight:  [74 kg] 74 kg (12/21 0630) Last BM Date: 09/06/21 (per pt)  Weight change: Filed Weights   09/08/21 0500 09/09/21 0600 09/10/21 0630  Weight: 78.4 kg 74.1 kg 74 kg    Intake/Output:   Intake/Output Summary (Last 24 hours) at 09/10/2021 0818 Last data filed at 09/10/2021 0700 Gross per 24 hour  Intake 2473.35 ml  Output 2550 ml  Net -76.65 ml      Physical Exam  CVP 16 General:  In bed. No resp difficulty. Appears weak HEENT: normal Neck: supple. JVP to jaw. Carotids 2+ bilat; no bruits. No lymphadenopathy or thryomegaly appreciated. Cor: PMI nondisplaced. Irregular rate & rhythm. No rubs,  or murmurs. + S3  Lungs: clear Abdomen: soft, nontender, distended. No hepatosplenomegaly. No bruits or masses. Good bowel sounds. Extremities: no cyanosis, clubbing, rash, edema. LUE  tender, due to IV infiltrate. RUE swan  Neuro: alert & orientedx3, cranial nerves grossly intact. moves all 4 extremities w/o difficulty. Affect flat    Telemetry    Afib 90-100s  Labs    CBC Recent Labs    09/09/21 0519 09/10/21 0318  WBC 7.3 7.3  HGB 12.2* 13.0  HCT  37.2* 38.5*  MCV 91.4 91.0  PLT 227 536   Basic Metabolic Panel  Recent Labs    09/09/21 0519 09/10/21 0318  NA 132* 128*  K 3.6 3.9  CL 98 93*  CO2 25 24  GLUCOSE 114* 118*  BUN 40* 33*  CREATININE 2.23* 2.14*  CALCIUM 8.5* 8.4*  MG 1.8 1.9   Liver Function Tests Recent Labs    09/08/21 0141  AST 33  ALT 28  ALKPHOS 93  BILITOT 2.5*  PROT 5.7*  ALBUMIN 2.8*   No results for input(s): LIPASE, AMYLASE in the last 72 hours. Cardiac Enzymes No results for input(s): CKTOTAL, CKMB, CKMBINDEX, TROPONINI in the last 72 hours.  BNP: BNP (last 3 results) Recent Labs    08/22/21 0510 09/04/21 1443 09/06/21 1243  BNP 1,593.4* 1,644.2* 1,614.8*    ProBNP (last 3 results) No results for input(s): PROBNP in the last 8760 hours.   D-Dimer No results for input(s): DDIMER in the last 72 hours. Hemoglobin A1C No results for input(s): HGBA1C in the last 72 hours. Fasting Lipid Panel Recent Labs    09/08/21 0141  CHOL 94  HDL 25*  LDLCALC 58  TRIG 54  CHOLHDL 3.8   Thyroid Function Tests Recent Labs    09/09/21 0519  T3FREE 2.1    Other results:  Imaging    DG Orthopantogram  Result Date: 09/09/2021 CLINICAL DATA:  Possible LVAD placement, initial encounter EXAM: ORTHOPANTOGRAM/PANORAMIC COMPARISON:  None. FINDINGS: Panoramic view of the mandible reveals no acute fracture. No bony abscess is seen. Patient shows 7 teeth along the anterior portion of the maxilla. No definitive periapical abscess is noted. No other focal abnormality is seen. IMPRESSION: No acute abnormality noted. Electronically Signed   By: Inez Catalina M.D.   On: 09/09/2021 20:50   DG CHEST PORT 1 VIEW  Result Date: 09/09/2021 CLINICAL DATA:  Heart failure EXAM: PORTABLE CHEST 1 VIEW COMPARISON:  09/07/2021 FINDINGS: Cardiac shadow is enlarged but stable. Defibrillator is again noted and stable. Swan-Ganz catheter is now noted in the pulmonary outflow tract. Right-sided PICC line is  noted extending into the mid superior vena cava. Right jugular central line is noted extending to the cavoatrial junction. No pneumothorax is seen. Slight increase in left retrocardiac opacification is noted when compare with the prior study. IMPRESSION: Increasing left retrocardiac consolidation. Tubes and lines as described above. New jugular line is seen without evidence of pneumothorax. Electronically Signed   By: Inez Catalina M.D.   On: 09/09/2021 15:44   CT CHEST ABDOMEN PELVIS WO CONTRAST  Result Date: 09/10/2021 CLINICAL DATA:  Rule out surgical contraindications for VAD implant EXAM: CT CHEST, ABDOMEN AND PELVIS WITHOUT CONTRAST TECHNIQUE: Multidetector CT imaging of the chest, abdomen and pelvis was performed following the standard protocol without IV contrast. COMPARISON:  CT abdomen pelvis, 03/13/2020 FINDINGS: CT CHEST FINDINGS Cardiovascular: Left chest multi lead pacer. Right upper extremity PICC. Cardiomegaly. Three-vessel coronary artery calcifications and stents. No pericardial effusion. Enlargement of the main pulmonary artery, measuring up to 3.9 cm in caliber. Mediastinum/Nodes: Enlarged pretracheal lymph nodes, largest measuring up to 1.9 x 1.8 cm (series 3, image 22). Thyroid gland, trachea, and esophagus demonstrate no significant findings. Lungs/Pleura: Mild centrilobular emphysema. Mild, diffuse bilateral bronchial wall thickening. Interlobular septal thickening throughout. Moderate left pleural effusion associated atelectasis or consolidation. Musculoskeletal: No chest wall mass or suspicious bone lesions identified. CT ABDOMEN PELVIS FINDINGS Hepatobiliary: No solid liver abnormality is seen. Unchanged fluid attenuation cysts or hemangiomata (series 3, image 61). No gallstones, gallbladder wall thickening, or biliary dilatation. Pancreas: Unremarkable. No pancreatic ductal dilatation or surrounding inflammatory changes. Spleen: Normal in size without significant abnormality.  Adrenals/Urinary Tract: Stable, benign bilateral adrenal adenomata (series 3, image 69, 75). Kidneys are normal, without renal calculi, solid lesion, or hydronephrosis. Thickening of the urinary bladder wall, likely secondary to chronic outlet obstruction. Stomach/Bowel: Stomach is within normal limits. Appendix appears normal. No evidence of bowel wall thickening, distention, or inflammatory changes. Sigmoid diverticula. Vascular/Lymphatic: Aortic atherosclerosis. No enlarged abdominal or pelvic lymph nodes. Reproductive: Prostatomegaly. Other: Fat and fluid containing left inguinal hernia. Anasarca. Small volume 4 quadrant ascites. Musculoskeletal: No acute or significant osseous findings. IMPRESSION: 1. Moderate left pleural effusion and associated atelectasis or consolidation. 2. Mild pulmonary edema. 3. Mild emphysema. 4. Cardiomegaly and coronary artery disease. 5. Enlargement of the main pulmonary artery, as can be seen in pulmonary hypertension. 6. Small volume ascites and anasarca. 7. Prostatomegaly. Thickening of the urinary bladder wall, likely secondary to chronic outlet obstruction. Aortic Atherosclerosis (ICD10-I70.0) and Emphysema (ICD10-J43.9). Electronically Signed   By: Delanna Ahmadi M.D.   On: 09/10/2021 08:13     Medications:     Scheduled Medications:  Chlorhexidine Gluconate Cloth  6 each Topical Daily   furosemide  40 mg Intravenous BID   montelukast  5 mg  Oral QHS   potassium chloride  40 mEq Oral BID   rosuvastatin  10 mg Oral Daily   senna-docusate  2 tablet Oral QHS   sodium chloride flush  10-40 mL Intracatheter Q12H   sodium chloride flush  3 mL Intravenous Q12H   sodium chloride flush  3 mL Intravenous Q12H   sodium chloride flush  3 mL Intravenous Q12H    Infusions:  sodium chloride Stopped (09/09/21 1718)   amiodarone 60 mg/hr (09/10/21 0700)   heparin 800 Units/hr (09/10/21 0700)   milrinone 0.375 mcg/kg/min (09/10/21 0700)   norepinephrine (LEVOPHED) Adult  infusion 3 mcg/min (09/10/21 0700)    PRN Medications: sodium chloride, acetaminophen, albuterol, ondansetron (ZOFRAN) IV, sodium chloride flush, sodium chloride flush    Assessment/Plan   1. Acute on chronic systolic CHF:  Long-standing cardiomyopathy.  [place].  Echo this admission with EF < 20%, severe LV dilation, restrictive diastolic function, moderate RV dysfunction, moderate MR, mod-severe TR. Cause of cardiomyopathy is uncertain.  He has a history of CAD, but I do not think that the described CAD from the past could explain his cardiomyopathy, but CAD could have progressed.  With difficulty tolerating GDMT/need for midodrine and cardiorenal syndrome as well as profound volume overload,  concerned for low output HF.  Co-ox off milrinone was 36%, milrinone started and increased to 0.375.   - 12/19 RHC on milrinone 0.375 mcg, stable filling pressures/CO 6 and CI 3 but thermo CO 3.5 CI 1.8 .  -Swan removed 12/20  -Todays CVP trending up 16. Increase lasix to 80 mg twice a day and give dose of metolazone.   - Continue Milrinone 0.375 mcg and add Norepi 3 mcg.  -  Continue home midodrine.  -  Will need eventual coronary angiography but in absence of ACS, would wait until creatinine stabilizes. Discussed risks/benefits with patient and he agrees to procedure.  - Will need to try to get him back into NSR (see below).  - He has been  RV pacing frequently which is not ideal, will need to see if this is improved by regaining NSR.  - EP adjusted his device to try to decrease RV pacing.  -Possible candidate if renal function improves/stabilizes.  RV looks moderately dysfunctional on my assessment of echo.  2. Atrial fibrillation: Persistent since 10/22 based on device interrogation.  Would benefit from resumption of NSR.  -Remains in A fib.  - Continue amiodarone gtt.  - He is on heparin gtt.  - Will aim to diurese then try to wean milrinone and cardiovert.  3. CKD stage 3:  Creatinine 1.99 => 2.14 today, suspect cardiorenal syndrome.  Follow closely with diuresis.  4. Syncope: Prior to admission, stood up then passed out.  ?Orthostatic versus arrhythmic.  He had VT run terminated by ATP that may have been around the time of syncope.   - Now on amiodarone gtt.  5. Elevated TSH/elevated free T4: Mixed picture, ?sick euthyroid.   Free T3 pending.   6. CAD: History of PCI to OM1 in 2007 and RCA in 2013.  No recent chest pain.  - Would aim for coronary angiography once creatinine stabilizes - Continue Crestor.  7. Malnutrition -Prealbumin 7.8  -Consult dietitian 8. Fever  -T 101.7 . WBC 7.3.  -Check blood cultures x2 and UA CX now.  9. L Pleural Effusion  Noted on CT  With fever will need to cancel Impella. Obtain cultures today. Start empiric vanc and cefepime.   Discussed with  Dr Zlatan Hornback/Dr Kipp Brood. Holding Impell today.   CCM consulted.    Length of Stay: 4  Amy Clegg, NP  09/10/2021, 8:18 AM  Advanced Heart Failure Team Pager [phone] (M-F; 7a - 5p)  Please contact [facility] for night-coverage after hours (5p -7a ) and weekends on [email or web address]    Agree with above.   Developed a fever overnight. Tm 101.7. Luiz Blare out.   Remains on milrinone 0.375 mcg + norepi 3 mcg + amiodarone drip.  CO-OX 56%   CVP 16 Creatinine 2.14  Feels weak. Nauseated. Remains in AF  General:  Lying in bed weak appearing. No resp difficulty HEENT: normal Neck: supple. JVP to ear  Carotids 2+ bilat; no bruits. No lymphadenopathy or thryomegaly appreciated. Cor: PMI nondisplaced. Irregular rate & rhythm. No rubs, gallops or murmurs. Lungs: clear Abdomen: soft, nontender, mildly distended. No hepatosplenomegaly. No bruits or masses. Hypoactive bowel sounds. Extremities: no cyanosis, clubbing, rash, 1-2+ edema Neuro: alert & orientedx3, cranial nerves grossly intact. moves all 4 extremities w/o difficulty. Affect pleasant  Remains tenuous. Given fever will need  to defer Impella 5.5 placement. Pan-culture. Start broad spectrum abx. Continue inotrope and pressor support. Continue amio and heparin. Start bowel regimen.   D/w Dr. Lynetta Mare (CCM) at bedside.  CRITICAL CARE Performed by: Glori Bickers  Total critical care time: 35 minutes  Critical care time was exclusive of separately billable procedures and treating other patients.  Critical care was necessary to treat or prevent imminent or life-threatening deterioration.  Critical care was time spent personally by me (independent of midlevel providers or residents) on the following activities: development of treatment plan with patient and/or surrogate as well as nursing, discussions with consultants, evaluation of patient's response to treatment, examination of patient, obtaining history from patient or surrogate, ordering and performing treatments and interventions, ordering and review of laboratory studies, ordering and review of radiographic studies, pulse oximetry and re-evaluation of patient's condition.  Glori Bickers, MD  10:15 PM

## 2021-09-10 NOTE — Progress Notes (Signed)
Sputum collection attempted. Pt unable to produce sputum at this time. Pt stable. No resp distress noted.

## 2021-09-10 NOTE — Progress Notes (Addendum)
ANTICOAGULATION CONSULT NOTE   Pharmacy Consult for Heparin Indication: atrial fibrillation  Allergies  Allergen Reactions   Mushroom Extract Complex Nausea And Vomiting   Neosporin [Neomycin-Bacitracin Zn-Polymyx] Hives    Patient Measurements: Height: 5\' 9"  (175.3 cm) Weight: 74 kg (163 lb 2.3 oz) IBW/kg (Calculated) : 70.7 Heparin Dosing Weight: 81.6 kg  Vital Signs: Temp: 100.2 F (37.9 C) (12/21 2000) Temp Source: Oral (12/21 2000) BP: 102/64 (12/21 2000) Pulse Rate: 101 (12/21 2100)  Labs: Recent Labs    09/08/21 0141 09/08/21 0340 09/08/21 1314 09/08/21 1421 09/08/21 1422 09/09/21 0519 09/09/21 1342 09/10/21 0137 09/10/21 0318 09/10/21 1056 09/10/21 2158  HGB 12.9*  --  14.6   14.6  --   --  12.2*  --   --  13.0  --   --   HCT 38.8*  --  43.0   43.0  --   --  37.2*  --   --  38.5*  --   --   PLT 275  --   --   --   --  227  --   --  194  --   --   APTT >200*   < >  --   --  41* 38* 114* 130*  --  140*  --   HEPARINUNFRC >1.10*   < >  --    < >  --  0.76* 0.97* 0.92*  --  0.83* 0.46  CREATININE 2.20*  --   --   --  2.21* 2.23*  --   --  2.14*  --   --    < > = values in this interval not displayed.    Estimated Creatinine Clearance: 32.6 mL/min (A) (by C-G formula based on SCr of 2.14 mg/dL (H)).   Medical History: Past Medical History:  Diagnosis Date   Arrhythmia    atrial fibrillation   CHF (congestive heart failure) (HCC)    Chronic kidney disease    Coronary artery disease    Hyperlipidemia    Hypertension    Myocardial infarct (HCC)     Medications:  Medications Prior to Admission  Medication Sig Dispense Refill Last Dose   albuterol (VENTOLIN HFA) 108 (90 Base) MCG/ACT inhaler Inhale 2 puffs into the lungs every 6 (six) hours as needed.   unknown   apixaban (ELIQUIS) 5 MG TABS tablet Take 1 tablet (5 mg total) by mouth 2 (two) times daily. 60 tablet 2 09/06/2021 at 0500   furosemide (LASIX) 20 MG tablet Take 1 tablet (20 mg total) by  mouth daily as needed for edema or fluid (sob). (Patient taking differently: Take 20 mg by mouth daily.) 90 tablet 0 Past Week   midodrine (PROAMATINE) 2.5 MG tablet Take 1 tablet (2.5 mg total) by mouth 3 (three) times daily with meals. 90 tablet 2 09/06/2021   amiodarone (PACERONE) 200 MG tablet Take 1 tablet (200 mg total) by mouth 2 (two) times daily. (Patient not taking: Reported on 08/29/2021) 60 tablet 2 Not Taking   metoprolol tartrate (LOPRESSOR) 25 MG tablet Take 0.5 tablets (12.5 mg total) by mouth 2 (two) times daily. (Patient not taking: Reported on 08/29/2021) 30 tablet 2 Not Taking   montelukast (SINGULAIR) 10 MG tablet Take by mouth. (Patient not taking: Reported on 09/06/2021)   Not Taking   spironolactone (ALDACTONE) 25 MG tablet Take 0.5 tablets (12.5 mg total) by mouth daily. (Patient not taking: Reported on 08/29/2021) 15 tablet 2 Not Taking   tamsulosin (FLOMAX) 0.4  MG CAPS capsule TAKE 1 CAPSULE (0.4 MG TOTAL) BY MOUTH DAILY AFTER BREAKFAST. (Patient not taking: Reported on 08/29/2021) 30 capsule 0 Not Taking    Scheduled:   Chlorhexidine Gluconate Cloth  6 each Topical Daily   feeding supplement  237 mL Oral TID BM   furosemide  80 mg Intravenous BID   montelukast  5 mg Oral QHS   multivitamin with minerals  1 tablet Oral Daily   potassium chloride  40 mEq Oral BID   rosuvastatin  10 mg Oral Daily   senna-docusate  2 tablet Oral QHS   sodium chloride flush  10-40 mL Intracatheter Q12H   sodium chloride flush  3 mL Intravenous Q12H   sodium chloride flush  3 mL Intravenous Q12H   sodium chloride flush  3 mL Intravenous Q12H   Infusions:   sodium chloride Stopped (09/10/21 1119)   amiodarone 60 mg/hr (09/10/21 2100)   ceFEPime (MAXIPIME) IV 2 g (09/10/21 2109)   heparin 600 Units/hr (09/10/21 2100)   milrinone 0.375 mcg/kg/min (09/10/21 2100)   norepinephrine (LEVOPHED) Adult infusion 3 mcg/min (09/10/21 2100)   [START ON 09/12/2021] vancomycin     PRN: sodium  chloride, acetaminophen, albuterol, ondansetron (ZOFRAN) IV, sodium chloride flush, sodium chloride flush  Assessment: 66 yom with a history of stage of systolic CHF chronic LVEF less than 20%, status post AICD/PPM, PAF on Eliquis, CKD stage IIIb, HTN, HLD. Patient is presenting with weakness, near syncope, and poor oral intake. Heparin per pharmacy consult placed for atrial fibrillation in preparation for RHC on Monday.  Impella cancelled 12/21 due to fever. Heparin level 0.48 is therapeutic on 600 units/hr but with big downtrend. Will increase slightly to prevent subtherapeutic level.  No issues with infusion or bleeding per RN.   Goal of Therapy:  Heparin level 0.3-0.7 units/ml aPTT 66-102 seconds Monitor platelets by anticoagulation protocol: Yes   Plan:  Increase heparin to 650 units/hr Monitor daily HL, CBC/plt Monitor for signs/symptoms of bleeding    Benetta Spar, PharmD, BCPS, BCCP Clinical Pharmacist  Please check AMION for all [place] phone numbers After 10:00 PM, call [place]

## 2021-09-10 NOTE — Consult Note (Signed)
NAME:  Isaac [name], MRN:  [id number], DOB:  June 06, 1951, LOS: 4 ADMISSION DATE:  09/06/2021, CONSULTATION DATE:  12/21 REFERRING MD:  Dr. Aundra Dubin, CHIEF COMPLAINT: Acute on chronic systolic CHF; shock  History of Present Illness:  Patient is a 70 year old male with pertinent PMH of systolic CHF, CKD stage III, CAD, HLD, HTN, A. fib presents to [facility] ED on 12/17 with syncope.  Patient felt lightheaded and near syncope about 1 week ago prior to admission.  He states he stopped taking his seizure and heart failure medications.  He began experiencing SOB and leg swelling.  On 12/17, patient experienced near syncope when standing up.  On 12/17 in ED, patient found to be hypotensive.  BNP and TSH elevated.  Heart failure consulted 12/19 and received cath.  12/20 Swan removed and was started on 3 mcg levo.  On 12/21 patient remains on milrinone 0.375 MCG, Levophed 3 MCG, amnio drip. Coox 56%. CVP 16.  Patient with plans to receive Impella.  Patient noted to have fever 101.7.  WBC 7.  BC x2, UA, CXR ordered.  PCCM consulted for new fever and medical management.   Pertinent  Medical History   Past Medical History:  Diagnosis Date   Arrhythmia    atrial fibrillation   CHF (congestive heart failure) ([place])    Chronic kidney disease    Coronary artery disease    Hyperlipidemia    Hypertension    Myocardial infarct ([place])      Significant Hospital Events: Including procedures, antibiotic start and stop dates in addition to other pertinent events   12/17: admitted for near syncope 12/19: hf team following; on levo, amio, milrinone 12/21: pccm consulted  Interim History / Subjective:  Patient A/O on room air On levo, amio, and milrinone Fever 101; wbc 7.3  Objective   Blood pressure 114/76, pulse (!) 103, temperature (!) 101.7 F (38.7 C), temperature source Oral, resp. rate 16, height 5\' 9"  (1.753 m), weight 74 kg, SpO2 96 %. PAP: (40-60)/(19-35) 40/24 CVP:  [7 mmHg-25 mmHg] 17  mmHg CO:  [3.4 L/min] 3.4 L/min CI:  [1.7 L/min/m2] 1.7 L/min/m2      Intake/Output Summary (Last 24 hours) at 09/10/2021 0851 Last data filed at 09/10/2021 0700 Gross per 24 hour  Intake 2233.35 ml  Output 2350 ml  Net -116.65 ml   Filed Weights   09/08/21 0500 09/09/21 0600 09/10/21 0630  Weight: 78.4 kg 74.1 kg 74 kg    Examination: General:  ill appearing on room air HEENT: MM pink/moist Neuro: Aox3; MAE CV: s1s2, no m/r/g PULM:  dim clear BS bilaterally; room air GI: soft, bsx4 active  Extremities: warm/dry, trace BLE edema  Skin: no rashes or lesions    Resolved Hospital Problem list     Assessment & Plan:  Acute on chronic systolic CHF Near Syncope Atrial fibrillation CAD P: -HF team following -continue levo, milrinone, amio per HF team -plan for impela today -trend coox and cvp -lasix per hf team -continue midodrine -continue heparin gtt -continue crestor -hold HF meds while hypotensive  Shock: likely cardiogenic; fever 101 this am w/ cultures pending P: -continue levo, milrinone, amio per HF team -will hold on placing impela today -trend coox and cvp -lasix per hf team -continue midodrine -continue broad spectrum antibiotics -swan pulled yesterday -follow UA, bcx2, expectorated sputum -check procalcitonin -trend fever/wbc curve -prn tylenol for fever  L Pleural Effusion P: -cxr in am -pulm toiletry: IS  Elevated TSH/elevated free T4: possibly sick  euthyroid P: -t3 pending -will need further follow up outpatient -patient off amiodarone >7 days prior to admission; low suspicion for amio toxicity  CKD stage 3 P: -Trend BMP / urinary output -Replace electrolytes as indicated -Avoid nephrotoxic agents, ensure adequate renal perfusion  Malnutrition P: -nutrition consult  Best Practice (right click and "Reselect all SmartList Selections" daily)   Diet/type: Regular consistency (see orders) DVT prophylaxis: systemic heparin GI  prophylaxis: N/A Lines: Central line Foley:  N/A Code Status:  full code Last date of multidisciplinary goals of care discussion [12/21 spoke with patient and wife at bedside]  Labs   CBC: Recent Labs  Lab 09/06/21 1242 09/07/21 1140 09/08/21 0141 09/08/21 1314 09/09/21 0519 09/10/21 0318  WBC 6.7 6.2 4.9  --  7.3 7.3  NEUTROABS 1.5*  --   --   --   --   --   HGB 15.4 15.1 12.9* 14.6   14.6 12.2* 13.0  HCT 47.5 45.4 38.8* 43.0   43.0 37.2* 38.5*  MCV 94.1 92.3 92.2  --  91.4 91.0  PLT 330 326 275  --  227 193    Basic Metabolic Panel: Recent Labs  Lab 09/06/21 1242 09/07/21 0347 09/08/21 0141 09/08/21 1314 09/08/21 1422 09/09/21 0519 09/10/21 0318  NA 136 136 130* 136   136 134* 132* 128*  K 4.1 3.8 4.3 4.0   4.0 4.0 3.6 3.9  CL 103 102 98  --  99 98 93*  CO2 25 24 21*  --  24 25 24   GLUCOSE 94 90 213*  --  108* 114* 118*  BUN 40* 40* 41*  --  40* 40* 33*  CREATININE 1.95* 1.99* 2.20*  --  2.21* 2.23* 2.14*  CALCIUM 9.2 9.1 8.5*  --  8.9 8.5* 8.4*  MG 2.2  --  2.1  --   --  1.8 1.9  PHOS 3.8  --   --   --   --   --   --    GFR: Estimated Creatinine Clearance: 32.6 mL/min (A) (by C-G formula based on SCr of 2.14 mg/dL (H)). Recent Labs  Lab 09/07/21 1140 09/07/21 2014 09/08/21 0141 09/09/21 0519 09/10/21 0318  WBC 6.2  --  4.9 7.3 7.3  LATICACIDVEN  --  2.2* 1.6  --   --     Liver Function Tests: Recent Labs  Lab 09/06/21 1242 09/08/21 0141  AST 34 33  ALT 32 28  ALKPHOS 114 93  BILITOT 2.8* 2.5*  PROT 7.1 5.7*  ALBUMIN 3.4* 2.8*   No results for input(s): LIPASE, AMYLASE in the last 168 hours. No results for input(s): AMMONIA in the last 168 hours.  ABG    Component Value Date/Time   HCO3 25.5 09/08/2021 1314   HCO3 26.2 09/08/2021 1314   TCO2 27 09/08/2021 1314   TCO2 27 09/08/2021 1314   O2SAT 56.4 09/10/2021 0318     Coagulation Profile: Recent Labs  Lab 09/06/21 2039  INR 2.0*    Cardiac Enzymes: No results for input(s):  CKTOTAL, CKMB, CKMBINDEX, TROPONINI in the last 168 hours.  HbA1C: Hgb A1c MFr Bld  Date/Time Value Ref Range Status  08/23/2021 04:55 AM 6.1 (H) 4.8 - 5.6 % Final    Comment:    (NOTE)         Prediabetes: 5.7 - 6.4         Diabetes: >6.4         Glycemic control for adults with diabetes: <7.0  CBG: No results for input(s): GLUCAP in the last 168 hours.  Review of Systems:   Review of Systems  Constitutional:  Positive for fever.  HENT:  Negative for congestion.   Respiratory:  Negative for cough, shortness of breath and wheezing.   Cardiovascular:  Negative for chest pain.  Gastrointestinal:  Negative for nausea and vomiting.  Genitourinary:  Negative for dysuria.    Past Medical History:  He,  has a past medical history of Arrhythmia, CHF (congestive heart failure) ([place]), Chronic kidney disease, Coronary artery disease, Hyperlipidemia, Hypertension, and Myocardial infarct ([place]).   Surgical History:   Past Surgical History:  Procedure Laterality Date   CARDIAC DEFIBRILLATOR PLACEMENT  feb 2014   RIGHT HEART CATH N/A 09/08/2021   Procedure: RIGHT HEART CATH;  Surgeon: Larey Dresser, MD;  Location: [facility];  Service: Cardiovascular;  Laterality: N/A;     Social History:   reports that he has quit smoking. He has never used smokeless tobacco. He reports that he does not drink alcohol and does not use drugs.   Family History:  His family history is not on file.   Allergies Allergies  Allergen Reactions   Mushroom Extract Complex Nausea And Vomiting   Neosporin [Neomycin-Bacitracin Zn-Polymyx] Hives     Home Medications  Prior to Admission medications   Medication Sig Start Date End Date Taking? Authorizing Provider  albuterol (VENTOLIN HFA) 108 (90 Base) MCG/ACT inhaler Inhale 2 puffs into the lungs every 6 (six) hours as needed. 05/01/21  Yes [provider]  apixaban (ELIQUIS) 5 MG TABS tablet Take 1 tablet (5 mg total) by mouth 2 (two)  times daily. 08/24/21 11/22/21 Yes Dahal, Marlowe Aschoff, MD  furosemide (LASIX) 20 MG tablet Take 1 tablet (20 mg total) by mouth daily as needed for edema or fluid (sob). Patient taking differently: Take 20 mg by mouth daily. 08/24/21 11/22/21 Yes Dahal, Marlowe Aschoff, MD  midodrine (PROAMATINE) 2.5 MG tablet Take 1 tablet (2.5 mg total) by mouth 3 (three) times daily with meals. 08/24/21 11/22/21 Yes Dahal, Marlowe Aschoff, MD  amiodarone (PACERONE) 200 MG tablet Take 1 tablet (200 mg total) by mouth 2 (two) times daily. Patient not taking: Reported on 08/29/2021 08/24/21 11/22/21  Terrilee Croak, MD  metoprolol tartrate (LOPRESSOR) 25 MG tablet Take 0.5 tablets (12.5 mg total) by mouth 2 (two) times daily. Patient not taking: Reported on 08/29/2021 08/24/21 11/22/21  Terrilee Croak, MD  montelukast (SINGULAIR) 10 MG tablet Take by mouth. Patient not taking: Reported on 09/06/2021 10/23/19 10/22/20  [provider]  spironolactone (ALDACTONE) 25 MG tablet Take 0.5 tablets (12.5 mg total) by mouth daily. Patient not taking: Reported on 08/29/2021 08/24/21 11/22/21  Terrilee Croak, MD  tamsulosin (FLOMAX) 0.4 MG CAPS capsule TAKE 1 CAPSULE (0.4 MG TOTAL) BY MOUTH DAILY AFTER BREAKFAST. Patient not taking: Reported on 08/29/2021 04/24/20   Nori Riis, PA-C     Critical care time: 45 minutes    JD Rexene Agent [employer] 09/10/2021, 8:51 AM  Please see [email or web address] for pager details.  From 7A-7P if no response, please call [phone]. After hours, please call ELink [phone].

## 2021-09-11 ENCOUNTER — Inpatient Hospital Stay (HOSPITAL_COMMUNITY): Payer: Medicare HMO

## 2021-09-11 ENCOUNTER — Other Ambulatory Visit (HOSPITAL_COMMUNITY): Payer: Self-pay

## 2021-09-11 DIAGNOSIS — I8000 Phlebitis and thrombophlebitis of superficial vessels of unspecified lower extremity: Secondary | ICD-10-CM | POA: Diagnosis not present

## 2021-09-11 DIAGNOSIS — Z01810 Encounter for preprocedural cardiovascular examination: Secondary | ICD-10-CM | POA: Diagnosis not present

## 2021-09-11 DIAGNOSIS — I5022 Chronic systolic (congestive) heart failure: Secondary | ICD-10-CM

## 2021-09-11 DIAGNOSIS — M7989 Other specified soft tissue disorders: Secondary | ICD-10-CM

## 2021-09-11 LAB — SURGICAL PCR SCREEN
MRSA, PCR: NEGATIVE
Staphylococcus aureus: NEGATIVE

## 2021-09-11 LAB — CBC
HCT: 33.2 % — ABNORMAL LOW (ref 39.0–52.0)
Hemoglobin: 11.7 g/dL — ABNORMAL LOW (ref 13.0–17.0)
MCH: 30.6 pg (ref 26.0–34.0)
MCHC: 35.2 g/dL (ref 30.0–36.0)
MCV: 86.9 fL (ref 80.0–100.0)
Platelets: 180 10*3/uL (ref 150–400)
RBC: 3.82 MIL/uL — ABNORMAL LOW (ref 4.22–5.81)
RDW: 15.8 % — ABNORMAL HIGH (ref 11.5–15.5)
WBC: 7.7 10*3/uL (ref 4.0–10.5)
nRBC: 0 % (ref 0.0–0.2)

## 2021-09-11 LAB — BASIC METABOLIC PANEL
Anion gap: 11 (ref 5–15)
Anion gap: 12 (ref 5–15)
BUN: 38 mg/dL — ABNORMAL HIGH (ref 8–23)
BUN: 37 mg/dL — ABNORMAL HIGH (ref 8–23)
CO2: 25 mmol/L (ref 22–32)
CO2: 27 mmol/L (ref 22–32)
Calcium: 8.4 mg/dL — ABNORMAL LOW (ref 8.9–10.3)
Calcium: 8.3 mg/dL — ABNORMAL LOW (ref 8.9–10.3)
Chloride: 92 mmol/L — ABNORMAL LOW (ref 98–111)
Chloride: 86 mmol/L — ABNORMAL LOW (ref 98–111)
Creatinine, Ser: 2.4 mg/dL — ABNORMAL HIGH (ref 0.61–1.24)
Creatinine, Ser: 2.24 mg/dL — ABNORMAL HIGH (ref 0.61–1.24)
GFR, Estimated: 28 mL/min — ABNORMAL LOW (ref >60)
GFR, Estimated: 31 mL/min — ABNORMAL LOW (ref >60)
Glucose, Bld: 113 mg/dL — ABNORMAL HIGH (ref 70–99)
Glucose, Bld: 153 mg/dL — ABNORMAL HIGH (ref 70–99)
Potassium: 3.9 mmol/L (ref 3.5–5.1)
Potassium: 3.2 mmol/L — ABNORMAL LOW (ref 3.5–5.1)
Sodium: 128 mmol/L — ABNORMAL LOW (ref 135–145)
Sodium: 125 mmol/L — ABNORMAL LOW (ref 135–145)

## 2021-09-11 LAB — COOXEMETRY PANEL
Carboxyhemoglobin: 1.5 % (ref 0.5–1.5)
Methemoglobin: 0.7 % (ref 0.0–1.5)
O2 Saturation: 68.7 %
Total hemoglobin: 9.7 g/dL — ABNORMAL LOW (ref 12.0–16.0)

## 2021-09-11 LAB — T4, FREE: Free T4: 1.79 ng/dL — ABNORMAL HIGH (ref 0.61–1.12)

## 2021-09-11 LAB — URINE CULTURE: Special Requests: NORMAL

## 2021-09-11 LAB — MAGNESIUM: Magnesium: 1.8 mg/dL (ref 1.7–2.4)

## 2021-09-11 LAB — TYPE AND SCREEN
ABO/RH(D): O POS
Antibody Screen: NEGATIVE

## 2021-09-11 LAB — PROCALCITONIN: Procalcitonin: 1.03 ng/mL

## 2021-09-11 LAB — HEPARIN LEVEL (UNFRACTIONATED): Heparin Unfractionated: 0.43 IU/mL (ref 0.30–0.70)

## 2021-09-11 LAB — T3, FREE: T3, Free: 2 pg/mL (ref 2.0–4.4)

## 2021-09-11 MED ORDER — COLCHICINE 0.6 MG PO TABS
0.6000 mg | ORAL_TABLET | Freq: Every day | ORAL | Status: DC
  Administered 2021-09-11: 10:00:00 0.6 mg via ORAL
  Filled 2021-09-11: qty 1

## 2021-09-11 MED ORDER — MUPIROCIN 2 % EX OINT
1.0000 "application " | TOPICAL_OINTMENT | Freq: Two times a day (BID) | CUTANEOUS | Status: DC
  Administered 2021-09-11 – 2021-09-12 (×2): 1 via NASAL
  Filled 2021-09-11: qty 22

## 2021-09-11 MED ORDER — SODIUM CHLORIDE 0.9 % IV SOLN
2.0000 g | INTRAVENOUS | Status: DC
  Administered 2021-09-11 – 2021-09-14 (×3): 2 g via INTRAVENOUS
  Filled 2021-09-11 (×3): qty 2

## 2021-09-11 MED ORDER — POTASSIUM CHLORIDE CRYS ER 20 MEQ PO TBCR
40.0000 meq | EXTENDED_RELEASE_TABLET | Freq: Once | ORAL | Status: AC
  Administered 2021-09-11: 10:00:00 40 meq via ORAL
  Filled 2021-09-11: qty 2

## 2021-09-11 MED ORDER — MAGNESIUM SULFATE 2 GM/50ML IV SOLN
2.0000 g | Freq: Once | INTRAVENOUS | Status: AC
  Administered 2021-09-11: 10:00:00 2 g via INTRAVENOUS
  Filled 2021-09-11: qty 50

## 2021-09-11 NOTE — H&P (View-Only) (Signed)
3 Days Post-Op Procedure(s) (LRB): RIGHT HEART CATH (N/A) Subjective: No complaints. Nausea resolved   Objective: Vital signs in last 24 hours: Temp:  [97.8 F (36.6 C)-100.2 F (37.9 C)] 97.9 F (36.6 C) (12/22 1200) Pulse Rate:  [78-115] 83 (12/22 1300) Cardiac Rhythm: Atrial fibrillation (12/22 1200) Resp:  [12-26] 12 (12/22 1300) BP: (81-106)/(49-71) 93/71 (12/22 1300) SpO2:  [92 %-100 %] 98 % (12/22 1300) Weight:  [74.9 kg] 74.9 kg (12/22 0500)  Hemodynamic parameters for last 24 hours: CVP:  [5 mmHg-15 mmHg] 8 mmHg  Intake/Output from previous day: 12/21 0701 - 12/22 0700 In: 2677.7 [P.O.:960; I.V.:1217.8; IV Piggyback:499.9] Out: 3550 [Urine:3550] Intake/Output this shift: Total I/O In: 937.4 [P.O.:560; I.V.:227.4; IV Piggyback:150] Out: 475 [Urine:475]  Physical exam Alert and oriented x3, no focal motor deficit No carotid bruits Cardiac irregularly irregular Lungs diminished at bases otherwise clear   Lab Results: Recent Labs    09/10/21 0318 09/11/21 0145  WBC 7.3 7.7  HGB 13.0 11.7*  HCT 38.5* 33.2*  PLT 194 180   BMET:  Recent Labs    09/10/21 0318 09/11/21 0145  NA 128* 128*  K 3.9 3.9  CL 93* 92*  CO2 24 25  GLUCOSE 118* 113*  BUN 33* 38*  CREATININE 2.14* 2.40*  CALCIUM 8.4* 8.4*    PT/INR: No results for input(s): LABPROT, INR in the last 72 hours. ABG    Component Value Date/Time   HCO3 25.5 09/08/2021 1314   HCO3 26.2 09/08/2021 1314   TCO2 27 09/08/2021 1314   TCO2 27 09/08/2021 1314   O2SAT 68.7 09/11/2021 0428   CBG (last 3)  No results for input(s): GLUCAP in the last 72 hours.  Assessment/Plan: S/P Procedure(s) (LRB): RIGHT HEART CATH (N/A) Mr. Enyeart is a 70 yo man with a history of CAD, hypertension, atrial fibrillation, chronic systolic congestive heart failure, ICD, moderate MR, moderate to severe TR, stage III chronic kidney disease, who presented with exacerbation of his heart failure.  He currently is on  multiple inotropic agents but despite that still has a rising creatinine.  He was scheduled for Impella 5.5 left-ventricular assist device yesterday by Dr. Kipp Brood and [place].  However, the procedure was canceled due to fever.  He currently is on vancomycin and Maxipime.  Testing indicates a likely urinary tract infection.  His fever is resolved and his white count is normal.  Plan for placement of an Impella 5.5 via a right axillary artery approach tomorrow morning.  He is aware of the general nature of the procedure including the incision to be used in the need for general anesthesia.  I informed him of the indications, risk, benefits, and alternatives.  He understands the risks include, but are not limited to bleeding, possible need for transfusion, infection, cardiac injury, vascular injury, as well as general risks such as MI, DVT, PE, and stroke.  He accepts the risks and agrees to proceed.    LOS: 5 days    Melrose Nakayama 09/11/2021

## 2021-09-11 NOTE — TOC Benefit Eligibility Note (Signed)
Patient Teacher, English as a foreign language completed.    The patient is currently admitted and upon discharge could be taking colchicine 0.6 mg tablet.  The current 30 day co-pay is, $10.00.   The patient is insured through [place] Patient Advocate Specialist [place] Patient Advocate Team Direct Number: [phone]  Fax: [phone]

## 2021-09-11 NOTE — Plan of Care (Signed)
°  Problem: Clinical Measurements: Goal: Diagnostic test results will improve Outcome: Progressing Goal: Respiratory complications will improve Outcome: Progressing Goal: Cardiovascular complication will be avoided Outcome: Progressing   Problem: Activity: Goal: Risk for activity intolerance will decrease Outcome: Progressing   Problem: Elimination: Goal: Will not experience complications related to urinary retention Outcome: Progressing   Problem: Pain Managment: Goal: General experience of comfort will improve Outcome: Progressing

## 2021-09-11 NOTE — Progress Notes (Addendum)
Patient ID: Isaac [name], male   DOB: Jun 05, 1951, 71 y.o.   MRN: [id number]     Advanced Heart Failure Rounding Note  PCP-Cardiologist: None   Subjective:    12/19 RHC- RA 7, PA 39/14 (25), PCWP 11, CO 6.4 CI 3. Thermo 3.5 1.8. Lasix drip stopped after cath. 12/20 Swan removed.  Norepi 3 mcg added.  12/21 Fever. Blood CX drawn. UA + leukocytes. Started on vanc and cefepime. Diuresed with IV lasix + metolazone.   Remains on milrinone 0.375 mcg + norepi 3 mcg + amiodarone drip.  CO-OX 69%   CVP up to 16 Creatinine 2.14 -->2.4  Afebrile   Feels better. Nausea resolved. Hungry.  Objective:   Weight Range: 74.9 kg Body mass index is 24.38 kg/m.   Vital Signs:   Temp:  [98.3 F (36.8 C)-101.7 F (38.7 C)] 98.6 F (37 C) (12/22 0400) Pulse Rate:  [78-115] 100 (12/22 0700) Resp:  [12-27] 14 (12/22 0700) BP: (81-124)/(49-91) 81/56 (12/22 0700) SpO2:  [92 %-100 %] 96 % (12/22 0700) Weight:  [74.9 kg] 74.9 kg (12/22 0500) Last BM Date: 09/10/21  Weight change: Filed Weights   09/09/21 0600 09/10/21 0630 09/11/21 0500  Weight: 74.1 kg 74 kg 74.9 kg    Intake/Output:   Intake/Output Summary (Last 24 hours) at 09/11/2021 0727 Last data filed at 09/11/2021 0700 Gross per 24 hour  Intake 2677.72 ml  Output 3550 ml  Net -872.28 ml      Physical Exam   CVP 8  General:  Sitting in the chair. No resp difficulty HEENT: normal Neck: supple. JVP 7-8 . Carotids 2+ bilat; no bruits. No lymphadenopathy or thryomegaly appreciated. Cor: PMI nondisplaced. Irregular rate & rhythm. No rubs or murmurs. +S3  Lungs: clear Abdomen: soft, nontender, nondistended. No hepatosplenomegaly. No bruits or masses. Good bowel sounds. Extremities: no cyanosis, clubbing, rash, edema. LUE ace wrap Neuro: alert & orientedx3, cranial nerves grossly intact. moves all 4 extremities w/o difficulty. Affect pleasant   Telemetry   A fib 90-100s personally reviewed.   Labs    CBC Recent Labs     09/10/21 0318 09/11/21 0145  WBC 7.3 7.7  HGB 13.0 11.7*  HCT 38.5* 33.2*  MCV 91.0 86.9  PLT 194 440   Basic Metabolic Panel  Recent Labs    09/10/21 0318 09/11/21 0145  NA 128* 128*  K 3.9 3.9  CL 93* 92*  CO2 24 25  GLUCOSE 118* 113*  BUN 33* 38*  CREATININE 2.14* 2.40*  CALCIUM 8.4* 8.4*  MG 1.9 1.8   Liver Function Tests No results for input(s): AST, ALT, ALKPHOS, BILITOT, PROT, ALBUMIN in the last 72 hours.  No results for input(s): LIPASE, AMYLASE in the last 72 hours. Cardiac Enzymes No results for input(s): CKTOTAL, CKMB, CKMBINDEX, TROPONINI in the last 72 hours.  BNP: BNP (last 3 results) Recent Labs    08/22/21 0510 09/04/21 1443 09/06/21 1243  BNP 1,593.4* 1,644.2* 1,614.8*    ProBNP (last 3 results) No results for input(s): PROBNP in the last 8760 hours.   D-Dimer No results for input(s): DDIMER in the last 72 hours. Hemoglobin A1C No results for input(s): HGBA1C in the last 72 hours. Fasting Lipid Panel No results for input(s): CHOL, HDL, LDLCALC, TRIG, CHOLHDL, LDLDIRECT in the last 72 hours.  Thyroid Function Tests Recent Labs    09/10/21 0318  T3FREE 2.0    Other results:   Imaging    No results found.   Medications:  Scheduled Medications:  Chlorhexidine Gluconate Cloth  6 each Topical Daily   feeding supplement  237 mL Oral TID BM   furosemide  80 mg Intravenous BID   montelukast  5 mg Oral QHS   multivitamin with minerals  1 tablet Oral Daily   potassium chloride  40 mEq Oral BID   rosuvastatin  10 mg Oral Daily   senna-docusate  2 tablet Oral QHS   sodium chloride flush  10-40 mL Intracatheter Q12H   sodium chloride flush  3 mL Intravenous Q12H   sodium chloride flush  3 mL Intravenous Q12H   sodium chloride flush  3 mL Intravenous Q12H    Infusions:  sodium chloride Stopped (09/10/21 1119)   amiodarone 60 mg/hr (09/11/21 0700)   ceFEPime (MAXIPIME) IV Stopped (09/10/21 2139)   heparin 650 Units/hr  (09/11/21 0700)   milrinone 0.375 mcg/kg/min (09/11/21 0700)   norepinephrine (LEVOPHED) Adult infusion 3 mcg/min (09/11/21 0700)   [START ON 09/12/2021] vancomycin      PRN Medications: sodium chloride, acetaminophen, albuterol, ondansetron (ZOFRAN) IV, sodium chloride flush, sodium chloride flush    Assessment/Plan   1. Acute on chronic systolic CHF:  Long-standing cardiomyopathy.  [place].  Echo this admission with EF < 20%, severe LV dilation, restrictive diastolic function, moderate RV dysfunction, moderate MR, mod-severe TR. Cause of cardiomyopathy is uncertain.  He has a history of CAD, but I do not think that the described CAD from the past could explain his cardiomyopathy, but CAD could have progressed.  With difficulty tolerating GDMT/need for midodrine and cardiorenal syndrome as well as profound volume overload,  concerned for low output HF.  Co-ox off milrinone was 36%, milrinone started and increased to 0.375.   - 12/19 RHC on milrinone 0.375 mcg, stable filling pressures/CO 6 and CI 3 but thermo CO 3.5 CI 1.8 .  -Swan removed 12/20 .  -CVP 8. Creatinine trending up. Hold IV lasix.  - CO-OX 69%. Continue Milrinone 0.375 mcg and Norepi 3 mcg.  -  Will need eventual coronary angiography but in absence of ACS, would wait until creatinine stabilizes. Discussed risks/benefits with patient and he agrees to procedure.  - Will need to try to get him back into NSR (see below).  - He has been  RV pacing frequently which is not ideal, will need to see if this is improved by regaining NSR.  - EP adjusted his device to try to decrease RV pacing.  -Possible candidate if renal function improves/stabilizes.  RV looks moderately dysfunctional on my assessment of echo.  - BMET  2. Atrial fibrillation: Persistent since 10/22 based on device interrogation.  Would benefit from resumption of NSR.  -Remains in A fib. Cut back amio to 30 mg per hour to help with volume . - He is on  heparin gtt.  3. CKD stage 3: Creatinine 1.99 => 2.14=>2.4  today, suspect cardiorenal syndrome.  - Holding lasix as above.  4. Syncope: Prior to admission, stood up then passed out.  ?Orthostatic versus arrhythmic.  He had VT run terminated by ATP that may have been around the time of syncope.   - Now on amiodarone gtt.  5. Elevated TSH/elevated free T4: Mixed picture, ?sick euthyroid.   Free T3 pending.   6. CAD: History of PCI to OM1 in 2007 and RCA in 2013.   - No chest pain.   - Would aim for coronary angiography once creatinine stabilizes - Continue Crestor.  7. Malnutrition -Prealbumin 7.8  -Consult  dietitian 8. Fever  - Had fever 12/21.  -Afebrile. WBC 7.7 - Pro calcitonin 1.03  -12/21 Blood Cultures x2  - UA with moderate leukocytes. Urine culture pending.   - Had LUE IV infiltrate.  - Continue vanc + cefepime.  9. L Pleural Effusion  Noted on CT 10. Gout  -Uric Acid 13.3. He does not think he is having a gout flare.  - May need prednisone but hold off with infection..  11. Elevated PSA PSA 7.2   Cut back amio to 30 mg to reduce volume. May need to increase if he has increased ectopy.   Length of Stay: 5  Amy Clegg, NP  09/11/2021, 7:27 AM  Advanced Heart Failure Team Pager [phone] (M-F; 7a - 5p)  Please contact [facility] for night-coverage after hours (5p -7a ) and weekends on [email or web address]  Agree with above.   Remains on NE 3 and milrinone. Co-ox 69%. MAPs in 41s. Remains in AF. UA c/w UTI. Now on abx. Now afebrile. CVP 8. SCr up.   General: Sitting in chair. No resp difficulty HEENT: normal Neck: supple. CVP 8 Carotids 2+ bilat; no bruits. No lymphadenopathy or thryomegaly appreciated. Cor: PMI nondisplaced. Irregular rate & rhythm. No rubs, gallops or murmurs. Lungs: clear Abdomen: soft, nontender, nondistended. No hepatosplenomegaly. No bruits or masses. Good bowel sounds. Extremities: no cyanosis, clubbing, rash, edema Neuro: alert & orientedx3,  cranial nerves grossly intact. moves all 4 extremities w/o difficulty. Affect pleasant  Remains tenuous but now afebrile. Increase NE to keep MAP >= 70. Continue milrinone. Hold diuretics today. Continue abx. D/w TCTS. Impella 5.5 tomorrow.   CRITICAL CARE Performed by: Glori Bickers  Total critical care time: 45 minutes  Critical care time was exclusive of separately billable procedures and treating other patients.  Critical care was necessary to treat or prevent imminent or life-threatening deterioration.  Critical care was time spent personally by me (independent of midlevel providers or residents) on the following activities: development of treatment plan with patient and/or surrogate as well as nursing, discussions with consultants, evaluation of patient's response to treatment, examination of patient, obtaining history from patient or surrogate, ordering and performing treatments and interventions, ordering and review of laboratory studies, ordering and review of radiographic studies, pulse oximetry and re-evaluation of patient's condition.  Glori Bickers, MD  8:51 AM

## 2021-09-11 NOTE — Progress Notes (Signed)
ANTICOAGULATION CONSULT NOTE   Pharmacy Consult for Heparin Indication: atrial fibrillation  Allergies  Allergen Reactions   Mushroom Extract Complex Nausea And Vomiting   Neosporin [Neomycin-Bacitracin Zn-Polymyx] Hives    Patient Measurements: Height: 5\' 9"  (175.3 cm) Weight: 74.9 kg (165 lb 2 oz) IBW/kg (Calculated) : 70.7 Heparin Dosing Weight: 81.6 kg  Vital Signs: Temp: 98.6 F (37 C) (12/22 0400) Temp Source: Oral (12/22 0400) BP: 81/56 (12/22 0700) Pulse Rate: 100 (12/22 0700)  Labs: Recent Labs    09/09/21 0519 09/09/21 1342 09/10/21 0137 09/10/21 0318 09/10/21 1056 09/10/21 2158 09/11/21 0145  HGB 12.2*  --   --  13.0  --   --  11.7*  HCT 37.2*  --   --  38.5*  --   --  33.2*  PLT 227  --   --  194  --   --  180  APTT 38* 114* 130*  --  140*  --   --   HEPARINUNFRC 0.76* 0.97* 0.92*  --  0.83* 0.46 0.43  CREATININE 2.23*  --   --  2.14*  --   --  2.40*     Estimated Creatinine Clearance: 29 mL/min (A) (by C-G formula based on SCr of 2.4 mg/dL (H)).   Medical History: Past Medical History:  Diagnosis Date   Arrhythmia    atrial fibrillation   CHF (congestive heart failure) (HCC)    Chronic kidney disease    Coronary artery disease    Hyperlipidemia    Hypertension    Myocardial infarct (HCC)     Medications:  Medications Prior to Admission  Medication Sig Dispense Refill Last Dose   albuterol (VENTOLIN HFA) 108 (90 Base) MCG/ACT inhaler Inhale 2 puffs into the lungs every 6 (six) hours as needed.   unknown   apixaban (ELIQUIS) 5 MG TABS tablet Take 1 tablet (5 mg total) by mouth 2 (two) times daily. 60 tablet 2 09/06/2021 at 0500   furosemide (LASIX) 20 MG tablet Take 1 tablet (20 mg total) by mouth daily as needed for edema or fluid (sob). (Patient taking differently: Take 20 mg by mouth daily.) 90 tablet 0 Past Week   midodrine (PROAMATINE) 2.5 MG tablet Take 1 tablet (2.5 mg total) by mouth 3 (three) times daily with meals. 90 tablet 2  09/06/2021   amiodarone (PACERONE) 200 MG tablet Take 1 tablet (200 mg total) by mouth 2 (two) times daily. (Patient not taking: Reported on 08/29/2021) 60 tablet 2 Not Taking   metoprolol tartrate (LOPRESSOR) 25 MG tablet Take 0.5 tablets (12.5 mg total) by mouth 2 (two) times daily. (Patient not taking: Reported on 08/29/2021) 30 tablet 2 Not Taking   montelukast (SINGULAIR) 10 MG tablet Take by mouth. (Patient not taking: Reported on 09/06/2021)   Not Taking   spironolactone (ALDACTONE) 25 MG tablet Take 0.5 tablets (12.5 mg total) by mouth daily. (Patient not taking: Reported on 08/29/2021) 15 tablet 2 Not Taking   tamsulosin (FLOMAX) 0.4 MG CAPS capsule TAKE 1 CAPSULE (0.4 MG TOTAL) BY MOUTH DAILY AFTER BREAKFAST. (Patient not taking: Reported on 08/29/2021) 30 capsule 0 Not Taking    Scheduled:   Chlorhexidine Gluconate Cloth  6 each Topical Daily   feeding supplement  237 mL Oral TID BM   montelukast  5 mg Oral QHS   multivitamin with minerals  1 tablet Oral Daily   potassium chloride  40 mEq Oral Once   rosuvastatin  10 mg Oral Daily   senna-docusate  2  tablet Oral QHS   sodium chloride flush  10-40 mL Intracatheter Q12H   sodium chloride flush  3 mL Intravenous Q12H   sodium chloride flush  3 mL Intravenous Q12H   sodium chloride flush  3 mL Intravenous Q12H   Infusions:   sodium chloride Stopped (09/10/21 1119)   amiodarone 60 mg/hr (09/11/21 0700)   ceFEPime (MAXIPIME) IV     heparin 650 Units/hr (09/11/21 0700)   magnesium sulfate bolus IVPB     milrinone 0.375 mcg/kg/min (09/11/21 0700)   norepinephrine (LEVOPHED) Adult infusion 3 mcg/min (09/11/21 0700)   [START ON 09/12/2021] vancomycin     PRN: sodium chloride, acetaminophen, albuterol, ondansetron (ZOFRAN) IV, sodium chloride flush, sodium chloride flush  Assessment: 50 yom with a history of stage of systolic CHF chronic LVEF less than 20%, status post AICD/PPM, PAF on Eliquis, CKD stage IIIb, HTN, HLD. Patient is  presenting with weakness, near syncope, and poor oral intake. Heparin per pharmacy consult placed for atrial fibrillation in preparation for RHC on Monday.  Impella cancelled 12/21 due to fever. Heparin level 0.43 is therapeutic on 650 units/hr. No issues with infusion or bleeding per RN. Hgb down trending 13>11.7, plt also trending down from 220s to 180. No bleeding noted. Scr is up will adjust cefepime. No vancomycin is due today.   Goal of Therapy:  Heparin level 0.3-0.7 units/ml aPTT 66-102 seconds Monitor platelets by anticoagulation protocol: Yes   Plan:  Continue heparin at 650 units/hr Monitor daily HL, CBC/plt Monitor for signs/symptoms of bleeding  Cefepime 2g q24 hours   Erin Hearing PharmD., BCPS Clinical Pharmacist 09/11/2021 7:51 AM

## 2021-09-11 NOTE — Progress Notes (Signed)
Lower extremity venous bilateral, upper extremity venous left, and Pre-VAD ultrasound study completed.   Please see CV Proc for preliminary results.   Darlin Coco, RDMS, RVT

## 2021-09-11 NOTE — Progress Notes (Signed)
Brief Nutrition Note  New consult from this morning for prealbumin of 7.8 is acknowledged.  Patient seen by RD yesterday for LVAD evaluation. See previous RD note for full assessment.  Ensure TID ordered for patient to maximize nutrition and protein status for VAD placement.  Unit RD to follow as appropriate.  Derrel Nip, RD, LDN (she/her/hers) Clinical Inpatient Dietitian RD Pager/After-Hours/Weekend Pager # in [phone]

## 2021-09-11 NOTE — Progress Notes (Signed)
3 Days Post-Op Procedure(s) (LRB): RIGHT HEART CATH (N/A) Subjective: No complaints. Nausea resolved   Objective: Vital signs in last 24 hours: Temp:  [97.8 F (36.6 C)-100.2 F (37.9 C)] 97.9 F (36.6 C) (12/22 1200) Pulse Rate:  [78-115] 83 (12/22 1300) Cardiac Rhythm: Atrial fibrillation (12/22 1200) Resp:  [12-26] 12 (12/22 1300) BP: (81-106)/(49-71) 93/71 (12/22 1300) SpO2:  [92 %-100 %] 98 % (12/22 1300) Weight:  [74.9 kg] 74.9 kg (12/22 0500)  Hemodynamic parameters for last 24 hours: CVP:  [5 mmHg-15 mmHg] 8 mmHg  Intake/Output from previous day: 12/21 0701 - 12/22 0700 In: 2677.7 [P.O.:960; I.V.:1217.8; IV Piggyback:499.9] Out: 3550 [Urine:3550] Intake/Output this shift: Total I/O In: 937.4 [P.O.:560; I.V.:227.4; IV Piggyback:150] Out: 475 [Urine:475]  Physical exam Alert and oriented x3, no focal motor deficit No carotid bruits Cardiac irregularly irregular Lungs diminished at bases otherwise clear   Lab Results: Recent Labs    09/10/21 0318 09/11/21 0145  WBC 7.3 7.7  HGB 13.0 11.7*  HCT 38.5* 33.2*  PLT 194 180   BMET:  Recent Labs    09/10/21 0318 09/11/21 0145  NA 128* 128*  K 3.9 3.9  CL 93* 92*  CO2 24 25  GLUCOSE 118* 113*  BUN 33* 38*  CREATININE 2.14* 2.40*  CALCIUM 8.4* 8.4*    PT/INR: No results for input(s): LABPROT, INR in the last 72 hours. ABG    Component Value Date/Time   HCO3 25.5 09/08/2021 1314   HCO3 26.2 09/08/2021 1314   TCO2 27 09/08/2021 1314   TCO2 27 09/08/2021 1314   O2SAT 68.7 09/11/2021 0428   CBG (last 3)  No results for input(s): GLUCAP in the last 72 hours.  Assessment/Plan: S/P Procedure(s) (LRB): RIGHT HEART CATH (N/A) Mr. Radle is a 70 yo man with a history of CAD, hypertension, atrial fibrillation, chronic systolic congestive heart failure, ICD, moderate MR, moderate to severe TR, stage III chronic kidney disease, who presented with exacerbation of his heart failure.  He currently is on  multiple inotropic agents but despite that still has a rising creatinine.  He was scheduled for Impella 5.5 left-ventricular assist device yesterday by Dr. Kipp Brood and Howard.  However, the procedure was canceled due to fever.  He currently is on vancomycin and Maxipime.  Testing indicates a likely urinary tract infection.  His fever is resolved and his white count is normal.  Plan for placement of an Impella 5.5 via a right axillary artery approach tomorrow morning.  He is aware of the general nature of the procedure including the incision to be used in the need for general anesthesia.  I informed him of the indications, risk, benefits, and alternatives.  He understands the risks include, but are not limited to bleeding, possible need for transfusion, infection, cardiac injury, vascular injury, as well as general risks such as MI, DVT, PE, and stroke.  He accepts the risks and agrees to proceed.    LOS: 5 days    Melrose Nakayama 09/11/2021

## 2021-09-11 NOTE — Progress Notes (Addendum)
LVAD Initial Psychosocial Screening  Date/Time Initiated:  2:00pm  Referral Source:  Heart Failure Team Referral Reason:  LVAD Initial Assessment Source of Information:  Patient and patient wife, [name]  Demographics Name:  Isaac [name] Address:  [address] [place]  [place] [address] Home phone:  [phone] (home)    Marital Status: Married  Faith:  Christian Primary Language:  English SS: last 4# [id number]  DOB: 10/01/1950   Medical & Follow-up Adherence to Medical regimen/INR checks: compliant  Medication adherence: compliant  Physician/Clinic Appointment Attendance: compliant Comments: Pt and wife report compliance to medical regimen- no comments in provider notes with concerns regarding pt compliance.   Advance Directives: Do you have a Living Will or Medical POA? No  Would you like to complete a Living Will and Medical POA prior to surgery?  Not sure- would be willing to look at one and make a decision from there.  Verbalizes that he would want wife to make all decisions for him in case he is unable to make them for himself. Do you have Goals of Care? Yes discussed with palliative team. Have you had a consult with the Palliative Care Team at [facility]? Yes- but didn't remember meeting with them.  Psychological Health Appearance:  Unremarkable and In hospital gown Mental Status:  Alert, oriented- someone delayed with responses at time- wife attributes to exhaustion Eye Contact:  Good Thought Content:  Coherent Speech:  Logical/coherent Mood:  Appropriate and Pleasant  Affect:  Appropriate to circumstance and Positive Insight:  Good Judgement: Unimpaired Interaction Style:  Engaged and Positive  Family/Social Information Who lives in your home? Name: Menno Vanbergen  Relationship: spouse   Other family members/support persons in your life? Name: Scharlene Corn  Relationship: Ty Hilts      Daughter Lind Covert     Step-son Bettina Gavia (married to  Morgan)   Daughter-in-law   Caregiving Needs Who is the primary caregiver? [place] status:  in good health Do you drive?  yes Do you work?  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  no Contact number: [phone]  Who is the secondary caregiver? Bettina Gavia- daughter in law- married to pt [place] status:  good Do you drive?  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  Has 4 children that she is the primary caregiver for Contact number: [phone]- this is her husbandThomas's number didn't have her direct number readily available during interview  Home Environment/Personal Care Do you have reliable phone service? Yes  If so, what is the number?  [phone] Do you own or rent your home? own Current mortgage/rent: did not want to report mortgage amount but no concerns with paying Number of steps into the home? 0 How many levels in the home? 2 Assistive devices in the home? none Electrical needs for LVAD (3 prong outlets)? Yes has 3 pronged outlets Second hand smoke exposure in the home? no Travel distance from [facility]? 2-25 minutes  Community Are you active with community agencies/resources/homecare? No Agency Name:  n/a Are you active in a church, synagogue, mosque or other faith based community? No but pt wife is a Company secretary so they have a very strong faith- she makes a ministry based podcast Faith based institutions name:  n/a What other sources do you have for spiritual support? N/a  Are you active in any clubs or social organizations? no What do you do for fun?  Hobbies?  Interests? 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  Also loves gardening and spending times  outdoors.  Education/Work Information What is the last grade of school you completed? 12th Preferred method of learning?  Written Do you have any problems with reading or writing?  No Are you currently employed?  No  When were you last employed? 2013  Name of employer? 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 Worked there for 9 years If you are not working, do you plan to return to work after VAD surgery? Yes If yes, what type of employment do you hope to find? 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  No Did you serve in the [place]? No    Financial Information What is your source of income? Retirement income and pensions from [place] and city of [place] (he worked there for 30 years) Do you have difficulty meeting your monthly expenses? No Can you budget for the monthly cost for dressing supplies post procedure? Yes  Primary Health insurance:  [employer] Secondary Insurance: none Prescription plan: through [facility] What are your prescription co-pays? $60/month Do you use mail order for your prescriptions?  No Have you ever had to refuse medication due to cost?  Yes - only one time cause the insurance wouldn't approve the medication early so it would have be private pay for several days- states this only happened once so does not appear to be a recurring concern Have you applied for Medicaid?  no   Medical Information Briefly describe why you are here for evaluation: Pt states he is here to be evaluated for a "helper" for his heart.  Describes he would have a pump that would help his heart get blood to where it needs to go.  Was somewhat slow with this explanation and required a lot of prompting- wife states he is very tired at this time and can normally provide way more details on the LVAD. Do you  have a PCP or other medical provider? Dr. Kary Kos- thinks this is with [facility] Are you able to complete your ADL's?  yes Do you have a history of trauma, physical, emotional, or sexual abuse? no Do you have any family history of heart problems? Yes states a lot of people in his family have had heart issues Do you smoke now or past usage? Yes    Quit date:hasn't smoked since the 90's Do you drink alcohol now or past usage? Yes  but very infrequently only drinks 1 glass a few times a year  Are you currently using illegal drugs or misuse of medication or past usage? never  Have you ever been treated for substance abuse? No      Mental Health History How have you been feeling in the past year? Been feeling ok overall though admits its been hard to not be able to do as much with increased fatigue Have you ever had any problems with depression, anxiety or other mental health issues? no Do you see a counselor, psychiatrist or therapist?  no If you are currently experiencing problems are you interested in talking  with a professional? No Have you or are you taking medications for anxiety/depression or any mental health concerns?  No  What are your coping strategies under stressful situations? He just powers through them and makes himself do something productive.  Believes there is no point in stressing about things that have to be done so just keeps moving forward. Are there any other stressors in your life?  no Have you had any past or current thoughts of suicide? no How many hours do you sleep at night? Had very interrupted sleep prior to admission- had to sleep basically sitting up- not sure how many hours but was definitely poor quality. How is your appetite? Has noticed a decline in his appetite- food just not sounding as good to him. Would you be interested in attending the LVAD support group? Yes would be interested in considering this.  PHQ2 Depression Scale: 2- admits to feeling  somewhat down or less motivated maybe a day or so a week.  Feels as if this is situationally appropriate given his health and also feels as if some of that lack of motivation is just his physical limitations.  Also had recent loss of his sister last month which has been hard on him- he was very involved in her care and visited her frequently at her SNF in [place].  PHQ9 Depression scale (if positive PHQ2 screen):   will assess after LVAD implant.  Legal Do you currently have any legal issues/problems?  no Have you had any legal issues/problems in the past?  no Do you have a Durable POA?  no   Plan for VAD Implementation Do you know and understand what happens during the VAD surgery? Patient Verbalizes Understanding  of surgery and able to describe details- did require frequent prompting but understood basics What do you know about the risks and side effect associated with VAD surgery? Patient Verbalizes Understanding  of risks (infection, stroke and death)- main risk he expressed was of death- did not touch on other possible risks Explain what will happen right after surgery: Pt unable to verbalize what happens after surgery but after CSW discussed with him he could then verbalize what happens after surgery. What is your plan for transportation for the first 8 weeks post-surgery? (Patients are not recommended to drive post-surgery for 8 weeks)  Driver: Murrell Redden Do you have airbags in your vehicle?  There is a risk of discharging the device if the airbag were to deploy. Yes- understands risk of airbag and recommendation to sit in the back seat until sternal wound healed. What do you know about your diet post-surgery? Patient Verbalizes Understanding  of Heart healthy- was diagnosed with CHF 10 years ago and has made major dietary changes with help of his wife.  Now is vegetarian and wife cooks with no added salt. How do you plan to monitor your medications, current and future?  Wife just purchased  pillboxes and this will be the plan to organize medications.  How do you plan to complete ADL's post-surgery?  Pt hopeful to be be independent again but wife will be able to help with whatever he is unable to do post surgery. Will it be difficult to ask for help from your caregivers?  No- him and his wife report being very close and though pt admits he can be stubborn states his supports will be sure to make him take it easy and accept assistance when necessary.  Please explain what you hope will be improved about your  life as a result of receiving the LVAD? Pt is hopeful he can get back to doing things he did prior to surgery.  Wants to be able to garden again.  Also states him and his wife used to walk the entire [employer] and he hasn't been able to do that this year due to fatigue- would want to work up to walking the entire zoo again after surgery. Please tell me your biggest concern or fear about living with the LVAD?  Just worried it won't make a big difference in how he feels. How do you cope with your concerns and fears?  Acknowledges them but moves forward despite them because he understands the alternative is a continued decline. Please explain your understanding of how their body will change?  Expresses understanding that he will have a wife coming out of his abdomen.   Are you worried about these changes? Does not think this will bother him very much. Do you see any barriers to your surgery or follow-up? no  Understanding of LVAD Patient states understanding of the following: Surgical procedures and risks, Electrical need for LVAD (3 prong outlets), Safety precautions with LVAD (water, etc.), LVAD daily self-care (dressing changes, computer check, extra supplies), Outpatient follow up (LVAD clinic appts, monitoring blood thinners), and Need for Emergency Planning  Discussed and Reviewed with Patient and Caregiver  Patient's current level of motivation to prepare for LVAD:  high Patient's present Level of Consent for LVAD: has no doubts about consenting if he is approved to receive    Education provided to patient/family/caregiver:   Caregiver role and responsibiltiy, Financial planning for LVAD, Role of Clinical Social Worker, and Signs of Depression and Anxiety    Discussed and Reviewed with Patient and Caregiver  Caregiver questions Please explain what you hope will be improved about your life and loved ones life as a result of receiving the LVAD?  Just hopes that pt will have increased mobility/stamina.  Hates to see him not be able to do things he used to be able to- states his mind is so active and engaged and that it is hard for him to not have the physical capabilities to go with it. What is your biggest concern or fear about caregiving with an LVAD patient?  Worried that he will go through the surgery and not have any meaningful improvement. What is your plan for availability to provide care 24/7 x2 weeks post op and dressing changes ongoing?  [name] lives with pt and states they are "attached at the hip" so she does not have any commitments outside of assisting him. Who is the relief/backup caregiver and what is their availability?  Bettina Gavia is who they think would be the backup caregiver- she is a homemaker and lives locally so she would be most accessible if backup is needed.  She does care for 4 children but still feel as if she would be most accessible in an emergency though pt wife plans on being the caregiver 100% of the time. Preferred method of learning? Written  Do you drive? yes How do you handle stressful situations?  prayer Do you think you can do this? yes Is there anything that concerns about caregiving?  no Do you provide caregiving to anyone else?  no  Caregivers current level of motivation to prepare for LVAD: high- bases her motivation solely on what the patient wants.  States whatever he decides she will support him and be there  for him. Caregivers present level  of consent for LVAD: gives full consent if the pt does.  Clinical Interventions Needed:   CSW will monitor signs and symptoms of depression and assist with adjustment to life with an LVAD.  CSW encouraged attendance with the LVAD Support Group to assist further with adjustment and post implant peer support.   Clinical Impressions/Recommendations:    Mr. Raju is a 70yo male who lives with his wife and has strong family support from multiple other family members.  Has a daughter who lives in [place] as well as step sons who live locally.  During interview Mr. Laverdure seemed alert and oriented and answered questions appropriately but was somewhat delayed with responses which wife reports is not his norm and attributes this to fatigue.  Mr. 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  He stopped work for a bit then returned to working for the [employer] doing similar work when he had to stop in 2013 due to medical concerns.  He owns his home with his wife and has no concerns with paying for basic needs.  He receives Fish farm manager income as well as pensions from [place] and [place].  He reports a strong history of compliance with medications and appointment attendance. Has insurance coverage which covers his medications and bills ok for now.  Mr. Secrist is not active with community organizations but reports a very active lifestyle- likes photography, going on walks at the zoo with his wife, gardening, and enjoying time outdoors.  Mr. Chaney has struggled slightly with depressive feelings as of late but these are connected to the recent loss of his sister who lived in [place] and was close with visiting her in her SNF most weekends to help care for her.  It has been hard to process this loss as it has coincided with his health declining.  He scored a 2 on his PHQ-2 reporting he felt somewhat depressed or  unmotivated a day or two over the past two weeks.  When he feels down or stressed he tries to stay active to work through those feelings and finds this helps.  Does not report a history of mental health treatment.  He would consider engaging with LVAD support group if he gets LVAD.  Mr. Shelp was able to very generally explain LVAD procedure and subsequent hospital stay though required a lot of prompting.  His wife stated he was able to verbalize more detailed understanding earlier and believes his fatigue has made him delayed this afternoon.  His main concern with surgery would be that it does not improve his quality of life.  His goal of having LVAD surgery would be to get back to doing more physical activities that he was used to and has missed this past year- would like to be able to walk the whole zoo again with his wife like he could over a year ago.  Pt wife, [name], was also in attendance for interview and will be the patients primary caregiver if he gets the LVAD.  [name] lives with the patient and works on a religious podcast form their home.  [name] reports she is in good health and has no other caregiving responsibilities so could devote all her time the patients recovery.  She has reliable transportation and can take pt back and forth from appts.  Mr. Gerrard also has step son and his wife who live locally- the wife has been identified as  the secondary caregiver as she would be most accessible though she does have caregiving responsibilities for her 4 children.    The patient and his caregiver have a good understanding of the risks and benefits of the LVAD procedure.  Both patient and caregiver had a high level of motivation to get the surgery if he is approved.  There are no other concerns identified by the pt and caregiver at this time.  Jorge Ny, LCSW Clinical Social Worker [facility] Desk#: [phone] Cell#: [phone]

## 2021-09-11 NOTE — Anesthesia Preprocedure Evaluation (Addendum)
Anesthesia Evaluation  Patient identified by MRN, date of birth, ID band Patient awake    Reviewed: Allergy & Precautions, NPO status , Patient's Chart, lab work & pertinent test results, reviewed documented beta blocker date and time   History of Anesthesia Complications Negative for: history of anesthetic complications  Airway Mallampati: II  TM Distance: >3 FB Neck ROM: Full    Dental  (+) Dental Advisory Given   Pulmonary former smoker,    Pulmonary exam normal        Cardiovascular hypertension, Pt. on home beta blockers and Pt. on medications + CAD, + Past MI and +CHF  Normal cardiovascular exam+ Valvular Problems/Murmurs MR    '22 TTE - EF <20%. Global hypokinesis. The left ventricular internal cavity size was severely dilated. Grade III diastolic dysfunction (restrictive). Right ventricular systolic function is severely reduced. The right ventricular size is moderately enlarged. There is moderately elevated  pulmonary artery systolic pressure. Left atrial size was mild to moderately dilated. Right atrial size was mild to moderately dilated. Moderate mitral valve regurgitation. Tricuspid valve regurgitation is moderate to severe.    Neuro/Psych negative neurological ROS  negative psych ROS   GI/Hepatic negative GI ROS, Neg liver ROS,   Endo/Other   Na 125 (136 12/18) K 3.2 Ca 8.3 Cl 86   Renal/GU CRFRenal disease     Musculoskeletal negative musculoskeletal ROS (+)   Abdominal   Peds  Hematology  (+) anemia ,  On eliquis    Anesthesia Other Findings   Reproductive/Obstetrics                           Anesthesia Physical Anesthesia Plan  ASA: 4  Anesthesia Plan: General   Post-op Pain Management:    Induction: Intravenous  PONV Risk Score and Plan: 2 and Treatment may vary due to age or medical condition, Ondansetron and Dexamethasone  Airway Management Planned: Oral  ETT  Additional Equipment: Arterial line, CVP, PA Cath, TEE and Ultrasound Guidance Line Placement  Intra-op Plan:   Post-operative Plan: Possible Post-op intubation/ventilation  Informed Consent: I have reviewed the patients History and Physical, chart, labs and discussed the procedure including the risks, benefits and alternatives for the proposed anesthesia with the patient or authorized representative who has indicated his/her understanding and acceptance.     Dental advisory given  Plan Discussed with: CRNA and Anesthesiologist  Anesthesia Plan Comments:        Anesthesia Quick Evaluation

## 2021-09-11 NOTE — Progress Notes (Signed)
Physical Therapy Treatment Patient Details Name: Isaac [name] MRN: [id number] DOB: [birth year] Today's Date: 09/11/2021   History of Present Illness Pt is a 70 y.o. male admitted 09/07/21 with near-syncope, nausea, poor oral intake, generalized weakness. Workup for advanced heart failure and persistent inotropic with requirement, cardiogenic shock; potential destination LVAD. [place] 12/19. Pt scheduled for Impella 12/21, cancelled due to fever, now planned for 12/23. PMH includes advanced CHF (LVEF <20%), AICD/PPM, PAF on Eliquis, CKD 3, HTN, HLD.    PT Comments    The pt was able to demo good continued progress with mobility and stability with gait. He completed 566ft of hallway ambulation with speed intervals and no need for UE support or DME. All VSS with mobility, pt remains eager to maintain mobility and strength. Will continue to follow and re-assess needs and mobility after impella placement tomorrow (12/23).     Recommendations for follow up therapy are one component of a multi-disciplinary discharge planning process, led by the attending physician.  Recommendations may be updated based on patient status, additional functional criteria and insurance authorization.  Follow Up Recommendations   (HHPT vs cardiac rehab phase II)     Assistance Recommended at Discharge PRN  Equipment Recommendations  Rollator (4 wheels)    Recommendations for Other Services       Precautions / Restrictions Precautions Precautions: Fall;Other (comment) Restrictions Weight Bearing Restrictions: No     Mobility  Bed Mobility Overal bed mobility: Modified Independent Bed Mobility: Supine to Sit           General bed mobility comments: HOB elevated but no assist or incresed time    Transfers Overall transfer level: Needs assistance Equipment used: None Transfers: Sit to/from Stand Sit to Stand: Supervision           General transfer comment: supervision for line management, no  assist given    Ambulation/Gait Ambulation/Gait assistance: Supervision;Min guard Gait Distance (Feet): 550 Feet Assistive device: None Gait Pattern/deviations: Step-through pattern;Decreased stride length;Staggering right Gait velocity: 0.5 m/s (initial self-selected) able to increase to 0.89 m/s when cued to walk fast Gait velocity interpretation: 1.31 - 2.62 ft/sec, indicative of limited community ambulator   General Gait Details: Slow, mostly steady gait without DME;pt with no overt LOB and then able to increase speed significantly when cued, maintained for bouts of 20-30 ft       Balance Overall balance assessment: Needs assistance Sitting-balance support: No upper extremity supported;Feet supported Sitting balance-Leahy Scale: Good     Standing balance support: No upper extremity supported;During functional activity Standing balance-Leahy Scale: Good Standing balance comment: able to ambulate and increase speed without UE support                            Cognition Arousal/Alertness: Awake/alert Behavior During Therapy: WFL for tasks assessed/performed Overall Cognitive Status: Within Functional Limits for tasks assessed                                          Exercises      General Comments General comments (skin integrity, edema, etc.): VSS oN RA      Pertinent Vitals/Pain Pain Assessment: No/denies pain     PT Goals (current goals can now be found in the care plan section) Acute Rehab PT Goals Patient Stated Goal: Home soon PT Goal Formulation: With patient  Time For Goal Achievement: 09/21/21 Potential to Achieve Goals: Good Progress towards PT goals: Progressing toward goals    Frequency    Min 3X/week      PT Plan Current plan remains appropriate       AM-PAC PT "6 Clicks" Mobility   Outcome Measure  Help needed turning from your back to your side while in a flat bed without using bedrails?: None Help needed  moving from lying on your back to sitting on the side of a flat bed without using bedrails?: None Help needed moving to and from a bed to a chair (including a wheelchair)?: None Help needed standing up from a chair using your arms (e.g., wheelchair or bedside chair)?: None Help needed to walk in hospital room?: A Little Help needed climbing 3-5 steps with a railing? : A Little 6 Click Score: 22    End of Session   Activity Tolerance: Patient tolerated treatment well Patient left: in chair;with call bell/phone within reach;with nursing/sitter in room;with family/visitor present Nurse Communication: Mobility status PT Visit Diagnosis: Unsteadiness on feet (R26.81);Other abnormalities of gait and mobility (R26.89)     Time: 8367-2550 PT Time Calculation (min) (ACUTE ONLY): 18 min  Charges:  $Therapeutic Exercise: 23-37 mins                     [place] Carbo, PT, DPT   Acute Rehabilitation Department Pager #: [phone]   Sandra Cockayne 09/11/2021, 5:33 PM

## 2021-09-12 ENCOUNTER — Encounter (HOSPITAL_COMMUNITY): Payer: Self-pay | Admitting: Internal Medicine

## 2021-09-12 ENCOUNTER — Encounter (HOSPITAL_COMMUNITY)
Admission: EM | Disposition: A | Discharge status: Home Health | Payer: Self-pay | Source: Home / Self Care | Attending: Surgery

## 2021-09-12 ENCOUNTER — Inpatient Hospital Stay (HOSPITAL_COMMUNITY): Payer: Medicare HMO

## 2021-09-12 ENCOUNTER — Inpatient Hospital Stay (HOSPITAL_COMMUNITY): Payer: Medicare HMO | Admitting: Anesthesiology

## 2021-09-12 DIAGNOSIS — I5043 Acute on chronic combined systolic (congestive) and diastolic (congestive) heart failure: Secondary | ICD-10-CM

## 2021-09-12 HISTORY — PX: PLACEMENT OF IMPELLA LEFT VENTRICULAR ASSIST DEVICE: SHX6519

## 2021-09-12 HISTORY — PX: TEE WITHOUT CARDIOVERSION: SHX5443

## 2021-09-12 LAB — ECHO INTRAOPERATIVE TEE
Height: 69 in
Weight: 2691.38 oz

## 2021-09-12 LAB — CBC
HCT: 32.7 % — ABNORMAL LOW (ref 39.0–52.0)
HCT: 32 % — ABNORMAL LOW (ref 39.0–52.0)
Hemoglobin: 11.7 g/dL — ABNORMAL LOW (ref 13.0–17.0)
Hemoglobin: 11.3 g/dL — ABNORMAL LOW (ref 13.0–17.0)
MCH: 30.9 pg (ref 26.0–34.0)
MCH: 31 pg (ref 26.0–34.0)
MCHC: 35.8 g/dL (ref 30.0–36.0)
MCHC: 35.3 g/dL (ref 30.0–36.0)
MCV: 86.3 fL (ref 80.0–100.0)
MCV: 87.7 fL (ref 80.0–100.0)
Platelets: 182 10*3/uL (ref 150–400)
Platelets: 169 10*3/uL (ref 150–400)
RBC: 3.79 MIL/uL — ABNORMAL LOW (ref 4.22–5.81)
RBC: 3.65 MIL/uL — ABNORMAL LOW (ref 4.22–5.81)
RDW: 15.9 % — ABNORMAL HIGH (ref 11.5–15.5)
RDW: 16 % — ABNORMAL HIGH (ref 11.5–15.5)
WBC: 6.7 10*3/uL (ref 4.0–10.5)
WBC: 4.4 10*3/uL (ref 4.0–10.5)
nRBC: 0 % (ref 0.0–0.2)
nRBC: 0 % (ref 0.0–0.2)

## 2021-09-12 LAB — PROCALCITONIN: Procalcitonin: 1.03 ng/mL

## 2021-09-12 LAB — URINALYSIS, MICROSCOPIC (REFLEX)
RBC / HPF: >50 RBC/hpf (ref 0–5)
Squamous Epithelial / HPF: NONE SEEN (ref 0–5)

## 2021-09-12 LAB — URINALYSIS, ROUTINE W REFLEX MICROSCOPIC
Bilirubin Urine: NEGATIVE
Glucose, UA: NEGATIVE mg/dL
Ketones, ur: NEGATIVE mg/dL
Nitrite: NEGATIVE
Protein, ur: 30 mg/dL — AB
Specific Gravity, Urine: 1.025 (ref 1.005–1.030)
pH: 5.5 (ref 5.0–8.0)

## 2021-09-12 LAB — BASIC METABOLIC PANEL
Anion gap: 10 (ref 5–15)
Anion gap: 9 (ref 5–15)
BUN: 45 mg/dL — ABNORMAL HIGH (ref 8–23)
BUN: 42 mg/dL — ABNORMAL HIGH (ref 8–23)
CO2: 27 mmol/L (ref 22–32)
CO2: 25 mmol/L (ref 22–32)
Calcium: 8.3 mg/dL — ABNORMAL LOW (ref 8.9–10.3)
Calcium: 8.4 mg/dL — ABNORMAL LOW (ref 8.9–10.3)
Chloride: 89 mmol/L — ABNORMAL LOW (ref 98–111)
Chloride: 90 mmol/L — ABNORMAL LOW (ref 98–111)
Creatinine, Ser: 2.38 mg/dL — ABNORMAL HIGH (ref 0.61–1.24)
Creatinine, Ser: 2.17 mg/dL — ABNORMAL HIGH (ref 0.61–1.24)
GFR, Estimated: 29 mL/min — ABNORMAL LOW (ref >60)
GFR, Estimated: 32 mL/min — ABNORMAL LOW (ref >60)
Glucose, Bld: 150 mg/dL — ABNORMAL HIGH (ref 70–99)
Glucose, Bld: 125 mg/dL — ABNORMAL HIGH (ref 70–99)
Potassium: 3.4 mmol/L — ABNORMAL LOW (ref 3.5–5.1)
Potassium: 3.8 mmol/L (ref 3.5–5.1)
Sodium: 126 mmol/L — ABNORMAL LOW (ref 135–145)
Sodium: 124 mmol/L — ABNORMAL LOW (ref 135–145)

## 2021-09-12 LAB — MAGNESIUM: Magnesium: 2.6 mg/dL — ABNORMAL HIGH (ref 1.7–2.4)

## 2021-09-12 LAB — COOXEMETRY PANEL
Carboxyhemoglobin: 1.1 % (ref 0.5–1.5)
Carboxyhemoglobin: 1.2 % (ref 0.5–1.5)
Methemoglobin: 0.6 % (ref 0.0–1.5)
Methemoglobin: 0.8 % (ref 0.0–1.5)
O2 Saturation: 62.6 %
O2 Saturation: 57.9 %
Total hemoglobin: 10.4 g/dL — ABNORMAL LOW (ref 12.0–16.0)
Total hemoglobin: 11 g/dL — ABNORMAL LOW (ref 12.0–16.0)

## 2021-09-12 LAB — POCT I-STAT, CHEM 8
BUN: 40 mg/dL — ABNORMAL HIGH (ref 8–23)
Calcium, Ion: 1.16 mmol/L (ref 1.15–1.40)
Chloride: 91 mmol/L — ABNORMAL LOW (ref 98–111)
Creatinine, Ser: 2.3 mg/dL — ABNORMAL HIGH (ref 0.61–1.24)
Glucose, Bld: 131 mg/dL — ABNORMAL HIGH (ref 70–99)
HCT: 35 % — ABNORMAL LOW (ref 39.0–52.0)
Hemoglobin: 11.9 g/dL — ABNORMAL LOW (ref 13.0–17.0)
Potassium: 3.8 mmol/L (ref 3.5–5.1)
Sodium: 128 mmol/L — ABNORMAL LOW (ref 135–145)
TCO2: 26 mmol/L (ref 22–32)

## 2021-09-12 LAB — HEPARIN LEVEL (UNFRACTIONATED)
Heparin Unfractionated: 0.31 IU/mL (ref 0.30–0.70)
Heparin Unfractionated: 0.16 IU/mL — ABNORMAL LOW (ref 0.30–0.70)

## 2021-09-12 LAB — T4, FREE: Free T4: 1.86 ng/dL — ABNORMAL HIGH (ref 0.61–1.12)

## 2021-09-12 LAB — T3, FREE: T3, Free: 2 pg/mL (ref 2.0–4.4)

## 2021-09-12 LAB — FIBRINOGEN: Fibrinogen: 631 mg/dL — ABNORMAL HIGH (ref 210–475)

## 2021-09-12 LAB — LACTATE DEHYDROGENASE: LDH: 163 U/L (ref 98–192)

## 2021-09-12 SURGERY — INSERTION, CARDIAC ASSIST DEVICE, IMPELLA
Anesthesia: General

## 2021-09-12 MED ORDER — VANCOMYCIN HCL IN DEXTROSE 1-5 GM/200ML-% IV SOLN
1000.0000 mg | INTRAVENOUS | Status: AC
  Administered 2021-09-12: 09:00:00 1000 mg via INTRAVENOUS
  Filled 2021-09-12: qty 200

## 2021-09-12 MED ORDER — COLCHICINE 0.3 MG HALF TABLET
0.3000 mg | ORAL_TABLET | Freq: Every day | ORAL | Status: DC
  Administered 2021-09-12: 14:00:00 0.3 mg via ORAL
  Filled 2021-09-12 (×2): qty 1

## 2021-09-12 MED ORDER — HEPARIN SODIUM (PORCINE) 5000 UNIT/ML IJ SOLN
INTRAVENOUS | Status: DC
  Filled 2021-09-12 (×2): qty 10

## 2021-09-12 MED ORDER — FENTANYL CITRATE (PF) 100 MCG/2ML IJ SOLN
INTRAMUSCULAR | Status: DC | PRN
  Administered 2021-09-12 (×3): 50 ug via INTRAVENOUS

## 2021-09-12 MED ORDER — PROPOFOL 10 MG/ML IV BOLUS
INTRAVENOUS | Status: AC
  Filled 2021-09-12: qty 20

## 2021-09-12 MED ORDER — ROCURONIUM BROMIDE 10 MG/ML (PF) SYRINGE
PREFILLED_SYRINGE | INTRAVENOUS | Status: DC | PRN
  Administered 2021-09-12 (×2): 20 mg via INTRAVENOUS
  Administered 2021-09-12: 40 mg via INTRAVENOUS

## 2021-09-12 MED ORDER — PHENYLEPHRINE 40 MCG/ML (10ML) SYRINGE FOR IV PUSH (FOR BLOOD PRESSURE SUPPORT)
PREFILLED_SYRINGE | INTRAVENOUS | Status: AC
  Filled 2021-09-12: qty 20

## 2021-09-12 MED ORDER — ONDANSETRON HCL 4 MG/2ML IJ SOLN
INTRAMUSCULAR | Status: DC | PRN
  Administered 2021-09-12: 4 mg via INTRAVENOUS

## 2021-09-12 MED ORDER — PROPOFOL 10 MG/ML IV BOLUS
INTRAVENOUS | Status: DC | PRN
  Administered 2021-09-12: 30 mg via INTRAVENOUS

## 2021-09-12 MED ORDER — DEXAMETHASONE SODIUM PHOSPHATE 10 MG/ML IJ SOLN
INTRAMUSCULAR | Status: DC | PRN
  Administered 2021-09-12: 5 mg via INTRAVENOUS

## 2021-09-12 MED ORDER — LACTATED RINGERS IV SOLN: INTRAVENOUS | Status: DC | PRN

## 2021-09-12 MED ORDER — MIDAZOLAM HCL 2 MG/2ML IJ SOLN
INTRAMUSCULAR | Status: DC | PRN
  Administered 2021-09-12 (×2): 1 mg via INTRAVENOUS

## 2021-09-12 MED ORDER — HEPARIN 6000 UNIT IRRIGATION SOLUTION
Status: AC
  Filled 2021-09-12: qty 500

## 2021-09-12 MED ORDER — ALBUMIN HUMAN 25 % IV SOLN
12.5000 g | Freq: Once | INTRAVENOUS | Status: AC
  Administered 2021-09-12: 16:00:00 12.5 g via INTRAVENOUS
  Filled 2021-09-12: qty 50

## 2021-09-12 MED ORDER — CEFAZOLIN SODIUM-DEXTROSE 2-3 GM-%(50ML) IV SOLR
INTRAVENOUS | Status: DC | PRN
  Administered 2021-09-12: 2 g via INTRAVENOUS

## 2021-09-12 MED ORDER — SUGAMMADEX SODIUM 200 MG/2ML IV SOLN
INTRAVENOUS | Status: DC | PRN
  Administered 2021-09-12: 200 mg via INTRAVENOUS

## 2021-09-12 MED ORDER — DEXAMETHASONE SODIUM PHOSPHATE 10 MG/ML IJ SOLN
INTRAMUSCULAR | Status: AC
  Filled 2021-09-12: qty 1

## 2021-09-12 MED ORDER — HEMOSTATIC AGENTS (NO CHARGE) OPTIME
TOPICAL | Status: DC | PRN
  Administered 2021-09-12: 1 via TOPICAL

## 2021-09-12 MED ORDER — CEFAZOLIN SODIUM 1 G IJ SOLR
INTRAMUSCULAR | Status: AC
  Filled 2021-09-12: qty 20

## 2021-09-12 MED ORDER — 0.9 % SODIUM CHLORIDE (POUR BTL) OPTIME
TOPICAL | Status: DC | PRN
  Administered 2021-09-12: 07:00:00 1000 mL

## 2021-09-12 MED ORDER — LIDOCAINE 2% (20 MG/ML) 5 ML SYRINGE
INTRAMUSCULAR | Status: AC
  Filled 2021-09-12: qty 5

## 2021-09-12 MED ORDER — FENTANYL CITRATE (PF) 250 MCG/5ML IJ SOLN
INTRAMUSCULAR | Status: AC
  Filled 2021-09-12: qty 5

## 2021-09-12 MED ORDER — PHENYLEPHRINE 40 MCG/ML (10ML) SYRINGE FOR IV PUSH (FOR BLOOD PRESSURE SUPPORT)
PREFILLED_SYRINGE | INTRAVENOUS | Status: DC | PRN
  Administered 2021-09-12: 200 ug via INTRAVENOUS
  Administered 2021-09-12: 120 ug via INTRAVENOUS
  Administered 2021-09-12: 160 ug via INTRAVENOUS
  Administered 2021-09-12: 120 ug via INTRAVENOUS

## 2021-09-12 MED ORDER — VANCOMYCIN HCL 1000 MG IV SOLR: INTRAVENOUS | Status: DC | PRN

## 2021-09-12 MED ORDER — EPINEPHRINE HCL 5 MG/250ML IV SOLN IN NS
0.5000 ug/min | INTRAVENOUS | Status: DC
Start: 2021-09-12 — End: 2021-09-13
  Filled 2021-09-12: qty 250

## 2021-09-12 MED ORDER — SODIUM CHLORIDE 0.9% IV SOLUTION: INTRAVENOUS | Status: DC | PRN

## 2021-09-12 MED ORDER — HEPARIN SODIUM (PORCINE) 1000 UNIT/ML IJ SOLN
INTRAMUSCULAR | Status: DC | PRN
  Administered 2021-09-12: 5000 [IU] via INTRAVENOUS

## 2021-09-12 MED ORDER — HEPARIN SODIUM (PORCINE) 5000 UNIT/ML IJ SOLN
INTRAVENOUS | Status: DC
  Filled 2021-09-12: qty 10

## 2021-09-12 MED ORDER — POTASSIUM CHLORIDE CRYS ER 20 MEQ PO TBCR
40.0000 meq | EXTENDED_RELEASE_TABLET | Freq: Once | ORAL | Status: AC
  Administered 2021-09-12: 04:00:00 40 meq via ORAL
  Filled 2021-09-12: qty 2

## 2021-09-12 MED ORDER — HEPARIN SODIUM (PORCINE) 1000 UNIT/ML IJ SOLN
INTRAMUSCULAR | Status: AC
  Filled 2021-09-12: qty 1

## 2021-09-12 MED ORDER — MIDAZOLAM HCL 2 MG/2ML IJ SOLN
INTRAMUSCULAR | Status: AC
  Filled 2021-09-12: qty 2

## 2021-09-12 MED ORDER — MORPHINE SULFATE (PF) 2 MG/ML IV SOLN: 2.0000 mg | INTRAVENOUS | Status: DC | PRN

## 2021-09-12 MED ORDER — ALBUMIN HUMAN 25 % IV SOLN
12.5000 g | Freq: Once | INTRAVENOUS | Status: AC
  Administered 2021-09-12: 23:00:00 12.5 g via INTRAVENOUS
  Filled 2021-09-12: qty 50

## 2021-09-12 MED ORDER — ONDANSETRON HCL 4 MG/2ML IJ SOLN
INTRAMUSCULAR | Status: AC
  Filled 2021-09-12: qty 2

## 2021-09-12 MED ORDER — VANCOMYCIN HCL IN DEXTROSE 1-5 GM/200ML-% IV SOLN
1000.0000 mg | INTRAVENOUS | Status: DC
  Administered 2021-09-13 – 2021-09-14 (×2): 1000 mg via INTRAVENOUS
  Filled 2021-09-12 (×2): qty 200

## 2021-09-12 MED ORDER — LIDOCAINE 2% (20 MG/ML) 5 ML SYRINGE
INTRAMUSCULAR | Status: DC | PRN
  Administered 2021-09-12: 40 mg via INTRAVENOUS

## 2021-09-12 MED ORDER — ROCURONIUM BROMIDE 10 MG/ML (PF) SYRINGE
PREFILLED_SYRINGE | INTRAVENOUS | Status: AC
  Filled 2021-09-12: qty 10

## 2021-09-12 MED ORDER — OXYCODONE HCL 5 MG PO TABS
5.0000 mg | ORAL_TABLET | ORAL | Status: DC | PRN
  Administered 2021-09-13 (×2): 10 mg via ORAL
  Administered 2021-09-13: 08:00:00 5 mg via ORAL
  Administered 2021-09-14: 21:00:00 10 mg via ORAL
  Administered 2021-09-15: 04:00:00 5 mg via ORAL
  Administered 2021-09-15 – 2021-09-16 (×3): 10 mg via ORAL
  Filled 2021-09-12 (×4): qty 1
  Filled 2021-09-12 (×5): qty 2

## 2021-09-12 MED ORDER — HEPARIN 6000 UNIT IRRIGATION SOLUTION
Status: DC | PRN
  Administered 2021-09-12: 1

## 2021-09-12 SURGICAL SUPPLY — 49 items
ANCHOR CATH FOLEY SECURE (MISCELLANEOUS) ×6 IMPLANT
APPLICATOR TIP COSEAL (VASCULAR PRODUCTS) IMPLANT
BLADE CLIPPER SURG (BLADE) ×2 IMPLANT
CATH ACCU-VU SIZ PIG 5F 100CM (CATHETERS) ×1 IMPLANT
CATH DIAG EXPO 6F AL1 (CATHETERS) ×2 IMPLANT
CATH INFINITI 6F MPB2 (CATHETERS) ×2 IMPLANT
CLIP TI MEDIUM 24 (CLIP) ×1 IMPLANT
CLIP TI WIDE RED SMALL 24 (CLIP) ×1 IMPLANT
CONTAINER PROTECT SURGISLUSH (MISCELLANEOUS) ×1 IMPLANT
DRAPE C-ARM 42X72 X-RAY (DRAPES) ×3 IMPLANT
DRAPE CV SPLIT W-CLR ANES SCRN (DRAPES) ×2 IMPLANT
DRAPE PERI GROIN 82X75IN TIB (DRAPES) ×2 IMPLANT
DRAPE WARM FLUID 44X44 (DRAPES) ×1 IMPLANT
DRSG TEGADERM 4X4.75 (GAUZE/BANDAGES/DRESSINGS) ×1 IMPLANT
FELT TEFLON 1X6 (MISCELLANEOUS) ×1 IMPLANT
GAUZE SPONGE 4X4 12PLY STRL (GAUZE/BANDAGES/DRESSINGS) ×1 IMPLANT
GLOVE SURG MICRO LTX SZ7.5 (GLOVE) ×6 IMPLANT
GOWN STRL REUS W/ TWL LRG LVL3 (GOWN DISPOSABLE) ×4 IMPLANT
GOWN STRL REUS W/ TWL XL LVL3 (GOWN DISPOSABLE) ×1 IMPLANT
GOWN STRL REUS W/TWL LRG LVL3 (GOWN DISPOSABLE) ×4
GOWN STRL REUS W/TWL XL LVL3 (GOWN DISPOSABLE) ×1
GRAFT HEMASHIELD 10MM (Graft) ×1 IMPLANT
GRAFT VASC STRG 30X10STRL (Graft) ×1 IMPLANT
INSERT FOGARTY SM (MISCELLANEOUS) ×3 IMPLANT
KIT BASIN OR (CUSTOM PROCEDURE TRAY) ×2 IMPLANT
LIGACLIP SM TITANIUM (CLIP) ×2 IMPLANT
LOOP VESSEL MAXI BLUE (MISCELLANEOUS) ×2 IMPLANT
LOOP VESSEL MINI RED (MISCELLANEOUS) ×1 IMPLANT
NS IRRIG 1000ML POUR BTL (IV SOLUTION) ×8 IMPLANT
PACK CHEST (CUSTOM PROCEDURE TRAY) ×2 IMPLANT
PAD ARMBOARD 7.5X6 YLW CONV (MISCELLANEOUS) ×4 IMPLANT
PAD ELECT DEFIB RADIOL ZOLL (MISCELLANEOUS) ×2 IMPLANT
PUMP SET IMPELLA 5.5 US (CATHETERS) ×2 IMPLANT
SEALANT SURG COSEAL 8ML (VASCULAR PRODUCTS) ×2 IMPLANT
STAPLER VISISTAT 35W (STAPLE) ×1 IMPLANT
SUT ETHILON 3 0 PS 1 (SUTURE) ×4 IMPLANT
SUT PDS AB 1 CTX 36 (SUTURE) ×1 IMPLANT
SUT PROLENE 5 0 C 1 36 (SUTURE) ×4 IMPLANT
SUT SILK  1 MH (SUTURE) ×5
SUT SILK 1 MH (SUTURE) ×4 IMPLANT
SUT SILK 1 TIES 10X30 (SUTURE) ×2 IMPLANT
SUT VIC AB 2-0 CT1 27 (SUTURE) ×2
SUT VIC AB 2-0 CT1 TAPERPNT 27 (SUTURE) IMPLANT
SUT VIC AB 3-0 X1 27 (SUTURE) ×1 IMPLANT
TOWEL GREEN STERILE (TOWEL DISPOSABLE) ×2 IMPLANT
TOWEL GREEN STERILE FF (TOWEL DISPOSABLE) ×2 IMPLANT
TRAY FOLEY SLVR 16FR TEMP STAT (SET/KITS/TRAYS/PACK) ×2 IMPLANT
WATER STERILE IRR 1000ML POUR (IV SOLUTION) ×4 IMPLANT
WIRE EMERALD 3MM-J .035X260CM (WIRE) ×1 IMPLANT

## 2021-09-12 NOTE — Progress Notes (Addendum)
ANTICOAGULATION CONSULT NOTE   Pharmacy Consult for Heparin Indication: atrial fibrillation, impella 5.5  Allergies  Allergen Reactions   Mushroom Extract Complex Nausea And Vomiting   Neosporin [Neomycin-Bacitracin Zn-Polymyx] Hives    Patient Measurements: Height: 5\' 9"  (175.3 cm) Weight: 76.3 kg (168 lb 3.4 oz) IBW/kg (Calculated) : 70.7 Heparin Dosing Weight: 81.6 kg  Vital Signs: Temp: 97.2 F (36.2 C) (12/23 2004) Temp Source: Core (12/23 2000) Pulse Rate: 84 (12/23 2004)  Labs: Recent Labs    09/10/21 0137 09/10/21 0318 09/10/21 1056 09/10/21 2158 09/11/21 0145 09/11/21 1332 09/12/21 0220 09/12/21 0240 09/12/21 0909 09/12/21 1628 09/12/21 2010  HGB  --    < >  --   --  11.7*  --   --  11.7* 11.9* 11.3*  --   HCT  --    < >  --   --  33.2*  --   --  32.7* 35.0* 32.0*  --   PLT  --    < >  --   --  180  --   --  182  --  169  --   APTT 130*  --  140*  --   --   --   --   --   --   --   --   HEPARINUNFRC 0.92*  --  0.83*   < > 0.43  --  0.31  --   --   --  0.16*  CREATININE  --    < >  --   --  2.40*   < > 2.38*  --  2.30* 2.17*  --    < > = values in this interval not displayed.     Estimated Creatinine Clearance: 32.1 mL/min (A) (by C-G formula based on SCr of 2.17 mg/dL (H)).   Medical History: Past Medical History:  Diagnosis Date   Arrhythmia    atrial fibrillation   CHF (congestive heart failure) (HCC)    Chronic kidney disease    Coronary artery disease    Hyperlipidemia    Hypertension    Myocardial infarct (HCC)     Medications:  Medications Prior to Admission  Medication Sig Dispense Refill Last Dose   albuterol (VENTOLIN HFA) 108 (90 Base) MCG/ACT inhaler Inhale 2 puffs into the lungs every 6 (six) hours as needed.   unknown   apixaban (ELIQUIS) 5 MG TABS tablet Take 1 tablet (5 mg total) by mouth 2 (two) times daily. 60 tablet 2 09/06/2021 at 0500   furosemide (LASIX) 20 MG tablet Take 1 tablet (20 mg total) by mouth daily as needed  for edema or fluid (sob). (Patient taking differently: Take 20 mg by mouth daily.) 90 tablet 0 Past Week   midodrine (PROAMATINE) 2.5 MG tablet Take 1 tablet (2.5 mg total) by mouth 3 (three) times daily with meals. 90 tablet 2 09/06/2021   amiodarone (PACERONE) 200 MG tablet Take 1 tablet (200 mg total) by mouth 2 (two) times daily. (Patient not taking: Reported on 08/29/2021) 60 tablet 2 Not Taking   metoprolol tartrate (LOPRESSOR) 25 MG tablet Take 0.5 tablets (12.5 mg total) by mouth 2 (two) times daily. (Patient not taking: Reported on 08/29/2021) 30 tablet 2 Not Taking   montelukast (SINGULAIR) 10 MG tablet Take by mouth. (Patient not taking: Reported on 09/06/2021)   Not Taking   spironolactone (ALDACTONE) 25 MG tablet Take 0.5 tablets (12.5 mg total) by mouth daily. (Patient not taking: Reported on 08/29/2021) 15 tablet 2 Not  Taking   tamsulosin (FLOMAX) 0.4 MG CAPS capsule TAKE 1 CAPSULE (0.4 MG TOTAL) BY MOUTH DAILY AFTER BREAKFAST. (Patient not taking: Reported on 08/29/2021) 30 capsule 0 Not Taking    Scheduled:   Chlorhexidine Gluconate Cloth  6 each Topical Daily   colchicine  0.3 mg Oral Daily   feeding supplement  237 mL Oral TID BM   montelukast  5 mg Oral QHS   multivitamin with minerals  1 tablet Oral Daily   mupirocin ointment  1 application Nasal BID   rosuvastatin  10 mg Oral Daily   senna-docusate  2 tablet Oral QHS   sodium chloride flush  10-40 mL Intracatheter Q12H   sodium chloride flush  3 mL Intravenous Q12H   sodium chloride flush  3 mL Intravenous Q12H   sodium chloride flush  3 mL Intravenous Q12H   Infusions:   sodium chloride Stopped (09/10/21 1119)   amiodarone 30 mg/hr (09/12/21 2000)   ceFEPime (MAXIPIME) IV Stopped (09/11/21 1000)   EPINEPHrine NaCl     heparin 50 units/mL (Impella PURGE) in dextrose 5 % 1000 mL bag     lactated ringers 20 mL/hr at 09/12/21 2000   milrinone 0.375 mcg/kg/min (09/12/21 2000)   norepinephrine (LEVOPHED) Adult infusion 1  mcg/min (09/12/21 2000)   [START ON 09/13/2021] vancomycin     PRN: sodium chloride, sodium chloride, acetaminophen, albuterol, lactated ringers, morphine injection, ondansetron (ZOFRAN) IV, oxyCODONE, sodium chloride flush, sodium chloride flush  Assessment: 18 yom with a history of stage of systolic CHF chronic LVEF less than 20%, status post AICD/PPM, PAF on Eliquis, CKD stage IIIb, HTN, HLD. Patient is presenting with weakness, near syncope, and poor oral intake. Heparin per pharmacy consult placed for atrial fibrillation in preparation for RHC on Monday.  Impella cancelled 12/21 due to fever, now placed 12/23. Systemic heparin was stopped this afternoon. Heparin level tonight came back subtherapeutic at 0.16, only heparin purge (50 units/mL concentration). Hgb 11.3, plt 169. Still bleeding at the site and blood tinted urine. Impella running at P-7, purge pressures in 500s (stable), purge flow rates 8.6 mL/hr (430 units/hr from purge).    Goal of Therapy:  Heparin level 0.3-0.5 units/ml Monitor platelets by anticoagulation protocol: Yes   Plan:  No systemic heparin  Continue heparin purge  Heparin level 5a/5p Monitor for s/sx of bleeding, daily HL   Antonietta Jewel, PharmD, [place] Pharmacist  Phone: [phone] 09/12/2021 9:56 PM  Please check AMION for all [facility] phone numbers After 10:00 PM, call [place] [phone]

## 2021-09-12 NOTE — Op Note (Signed)
NAME: NGAI, PARCELL MEDICAL RECORD NO: [id number] ACCOUNT NO: [id number] DATE OF BIRTH: 1951/04/10 FACILITY: [facility] LOCATION: [facility] PHYSICIAN: Revonda Standard. Roxan Hockey, MD  Operative Report   DATE OF PROCEDURE: 09/12/2021  PREOPERATIVE DIAGNOSIS:  Acute on chronic systolic and diastolic heart failure.  POSTOPERATIVE DIAGNOSES:  Acute on chronic systolic and diastolic heart failure.  PROCEDURE:  Placement of the Impella 5.5 left ventricular assist device via right axillary artery approach.  SURGEON:  Revonda Standard. Roxan Hockey, MD  CO-SURGEON:  Glori Bickers, MD  ANESTHESIA:  General.  FINDINGS:  Good positioning of Impella device.  CLINICAL NOTE:  Mr. Isaac [name] is a 70 year old gentleman admitted with acute on chronic systolic and diastolic heart failure.  He has failed to respond to inotropic support and needs a left ventricular assist device for management of his heart failure.  The  indications, risks, benefits, and alternatives were discussed in detail with the patient.  He understood and accepted the risks and agreed to proceed.  DESCRIPTION OF PROCEDURE:  [name] was brought to the operating room on 09/12/2021.  He had induction of general anesthesia and was intubated.  Dr. Renold Don of Anesthesia performed transesophageal echocardiography.  It revealed severe left ventricular  dysfunction.  Pulmonary artery pressures were 60/40.  Intravenous antibiotics were administered.  The chest was prepped and draped in the usual sterile fashion.  A timeout was performed.  An incision was made in the right deltopectoral groove.  The deltopectoral fat pad was identified.  Dissection was carried down into the fat pad.  The brachial plexus was overlying the axillary artery.  It was gently dissected off. Proximal and  distal control was obtained on the axillary artery.  5000 units of heparin was administered.  After confirming adequate anticoagulation with ACT measurement, the axillary artery was  clamped proximally and distally and an arteriotomy was made.  A 10 mm  Hemashield graft was bevelled and was anastomosed end-to-side to the axillary artery with a running 5-0 Prolene suture.  The vessel was unclamped and the graft was deaired.  The insertion device for the Impella was placed into the graft and secured with  2-0 silk sutures.  A wire was then advanced through the graft into the aortic root.  A pigtail catheter was placed over the wire and ultimately manipulated into the left ventricle. Visualization was performed both with fluoroscopy and with  transesophageal echocardiography.  The catheter was initially entangled with the mitral valve and was repositioned.  The guidewire then was placed through the pigtail catheter and was in good position along the anterior wall, the Impella 5.5 device then  was inserted and with manipulation was adjusted into the correct position in the left ventricle.  The tip of the device was 5 cm from the aortic valve. Fluoroscopy also confirmed good positioning.  Flow was initiated with flows of 3.5 liters on P6.   The graft was clamped around the device and the peel-away sheath introducer was removed.  The graft was trimmed to length and the catheter was secured.  Positioning was again reconfirmed with TEE.  The wound was copiously irrigated with warm saline.   Hemostasis was achieved.  The wound then was closed in 2 layers and the skin was closed with staples.  The graft and Impella exited through the lateral aspect of the incision.  The patient was extubated in the operating room and taken to the surgical  intensive care unit, extubated and in stable condition.  The total fluoroscopy time was 11  minutes and 55 seconds.  The total dose was 105.47 milligrays.  All sponge, needle and instrument counts were correct at the end of the procedure.   PUS D: 09/12/2021 1:18:36 pm T: 09/12/2021 1:53:00 pm  JOB: [id number]/ [id number]

## 2021-09-12 NOTE — Anesthesia Procedure Notes (Addendum)
Central Venous Catheter Insertion Performed by: Audry Pili, MD, anesthesiologist Start/End12/23/2022 6:58 AM, 09/12/2021 7:12 AM Preanesthetic checklist: patient identified, IV checked, risks and benefits discussed, surgical consent, monitors and equipment checked, pre-op evaluation, timeout performed and anesthesia consent Position: Trendelenburg Lidocaine 1% used for infiltration and patient sedated Hand hygiene performed , maximum sterile barriers used  and Seldinger technique used Catheter size: 8.5 Fr Central line was placed.Sheath introducer Procedure performed using ultrasound guided technique. Ultrasound Notes:anatomy identified, needle tip was noted to be adjacent to the nerve/plexus identified, no ultrasound evidence of intravascular and/or intraneural injection and image(s) printed for medical record Attempts: 1 Following insertion, line sutured, dressing applied and Biopatch. Post procedure assessment: blood return through all ports, free fluid flow and no air  Patient tolerated the procedure well with no immediate complications.

## 2021-09-12 NOTE — Transfer of Care (Signed)
Immediate Anesthesia Transfer of Care Note  Patient: Isaac [name]  Procedure(s) Performed: PLACEMENT OF IMPELLA 5.5 LEFT VENTRICULAR ASSIST DEVICE TRANSESOPHAGEAL ECHOCARDIOGRAM (TEE)  Patient Location: ICU  Anesthesia Type:General  Level of Consciousness: awake and oriented  Airway & Oxygen Therapy: Patient Spontanous Breathing and Patient connected to nasal cannula oxygen  Post-op Assessment: Report given to RN and Post -op Vital signs reviewed and stable  Post vital signs: Reviewed and stable  Last Vitals:  Vitals Value Taken Time  BP 127/80   Temp    Pulse 96   Resp 19   SpO2 97     Last Pain:  Vitals:   09/12/21 0415  TempSrc: Oral  PainSc:          Complications: No notable events documented.

## 2021-09-12 NOTE — Brief Op Note (Signed)
09/12/2021  10:34 AM  PATIENT:  Isaac [name]  70 y.o. male  PRE-OPERATIVE DIAGNOSIS:  Congestive Heart Failure  POST-OPERATIVE DIAGNOSIS:  Congestive Heart Failure  PROCEDURE:  Procedure(s): PLACEMENT OF IMPELLA 5.5 LEFT VENTRICULAR ASSIST DEVICE (N/A) TRANSESOPHAGEAL ECHOCARDIOGRAM (TEE) (N/A)  Co-SURGEONS:  Surgeon(s) and Role: Melrose Nakayama, MD - Primary Glori Bickers, MD  PHYSICIAN ASSISTANT:   ASSISTANTS: none   ANESTHESIA:   general  EBL:  100 mL   BLOOD ADMINISTERED:none  DRAINS:  Impella 5.5 right axillary artery    LOCAL MEDICATIONS USED:  NONE  SPECIMEN:  No Specimen  DISPOSITION OF SPECIMEN:  N/A  COUNTS:  YES  TOURNIQUET:  * No tourniquets in log *  DICTATION: .Other Dictation: Dictation Number -  PLAN OF CARE: Admit to inpatient   PATIENT DISPOSITION:  ICU - extubated and stable.   Delay start of Pharmacological VTE agent (>24hrs) due to surgical blood loss or risk of bleeding: not applicable

## 2021-09-12 NOTE — Plan of Care (Signed)
°  Problem: Health Behavior/Discharge Planning: Goal: Ability to manage health-related needs will improve Outcome: Progressing   Problem: Clinical Measurements: Goal: Will remain free from infection Outcome: Progressing Goal: Diagnostic test results will improve Outcome: Progressing Goal: Respiratory complications will improve 09/12/2021 1449 by Terie Purser, RN Outcome: Progressing 09/12/2021 1448 by Terie Purser, RN Outcome: Progressing Goal: Cardiovascular complication will be avoided 09/12/2021 1449 by Terie Purser, RN Outcome: Progressing 09/12/2021 1448 by Terie Purser, RN Outcome: Progressing   Problem: Coping: Goal: Level of anxiety will decrease 09/12/2021 1449 by Terie Purser, RN Outcome: Progressing 09/12/2021 1448 by Terie Purser, RN Outcome: Progressing   Problem: Pain Managment: Goal: General experience of comfort will improve 09/12/2021 1449 by Terie Purser, RN Outcome: Progressing 09/12/2021 1448 by Terie Purser, RN Outcome: Progressing   Problem: Safety: Goal: Ability to remain free from injury will improve 09/12/2021 1449 by Terie Purser, RN Outcome: Progressing 09/12/2021 1448 by Terie Purser, RN Outcome: Progressing   Problem: Skin Integrity: Goal: Risk for impaired skin integrity will decrease Outcome: Progressing   Problem: Education: Goal: Ability to demonstrate management of disease process will improve Outcome: Progressing Goal: Ability to verbalize understanding of medication therapies will improve Outcome: Progressing   Problem: Cardiac: Goal: Ability to achieve and maintain adequate cardiopulmonary perfusion will improve Outcome: Progressing Note: Impella 5.5 placement today

## 2021-09-12 NOTE — Progress Notes (Signed)
°  Echocardiogram Echocardiogram Transesophageal has been performed.  Isaac [name] 09/12/2021, 12:45 PM

## 2021-09-12 NOTE — Progress Notes (Signed)
ANTICOAGULATION CONSULT NOTE   Pharmacy Consult for Heparin Indication: atrial fibrillation, impella 5.5  Allergies  Allergen Reactions   Mushroom Extract Complex Nausea And Vomiting   Neosporin [Neomycin-Bacitracin Zn-Polymyx] Hives    Patient Measurements: Height: 5\' 9"  (175.3 cm) Weight: 76.3 kg (168 lb 3.4 oz) IBW/kg (Calculated) : 70.7 Heparin Dosing Weight: 81.6 kg  Vital Signs: Temp: 98.2 F (36.8 C) (12/23 0415) Temp Source: Oral (12/23 0415) BP: 120/63 (12/23 0721) Pulse Rate: 88 (12/23 0724)  Labs: Recent Labs    09/09/21 1342 09/10/21 0137 09/10/21 0137 09/10/21 0318 09/10/21 1056 09/10/21 2158 09/11/21 0145 09/11/21 1332 09/12/21 0220 09/12/21 0240 09/12/21 0909  HGB  --   --    < > 13.0  --   --  11.7*  --   --  11.7* 11.9*  HCT  --   --    < > 38.5*  --   --  33.2*  --   --  32.7* 35.0*  PLT  --   --   --  194  --   --  180  --   --  182  --   APTT 114* 130*  --   --  140*  --   --   --   --   --   --   HEPARINUNFRC 0.97* 0.92*  --   --  0.83* 0.46 0.43  --  0.31  --   --   CREATININE  --   --    < > 2.14*  --   --  2.40* 2.24* 2.38*  --  2.30*   < > = values in this interval not displayed.     Estimated Creatinine Clearance: 30.3 mL/min (A) (by C-G formula based on SCr of 2.3 mg/dL (H)).   Medical History: Past Medical History:  Diagnosis Date   Arrhythmia    atrial fibrillation   CHF (congestive heart failure) (HCC)    Chronic kidney disease    Coronary artery disease    Hyperlipidemia    Hypertension    Myocardial infarct (HCC)     Medications:  Medications Prior to Admission  Medication Sig Dispense Refill Last Dose   albuterol (VENTOLIN HFA) 108 (90 Base) MCG/ACT inhaler Inhale 2 puffs into the lungs every 6 (six) hours as needed.   unknown   apixaban (ELIQUIS) 5 MG TABS tablet Take 1 tablet (5 mg total) by mouth 2 (two) times daily. 60 tablet 2 09/06/2021 at 0500   furosemide (LASIX) 20 MG tablet Take 1 tablet (20 mg total) by  mouth daily as needed for edema or fluid (sob). (Patient taking differently: Take 20 mg by mouth daily.) 90 tablet 0 Past Week   midodrine (PROAMATINE) 2.5 MG tablet Take 1 tablet (2.5 mg total) by mouth 3 (three) times daily with meals. 90 tablet 2 09/06/2021   amiodarone (PACERONE) 200 MG tablet Take 1 tablet (200 mg total) by mouth 2 (two) times daily. (Patient not taking: Reported on 08/29/2021) 60 tablet 2 Not Taking   metoprolol tartrate (LOPRESSOR) 25 MG tablet Take 0.5 tablets (12.5 mg total) by mouth 2 (two) times daily. (Patient not taking: Reported on 08/29/2021) 30 tablet 2 Not Taking   montelukast (SINGULAIR) 10 MG tablet Take by mouth. (Patient not taking: Reported on 09/06/2021)   Not Taking   spironolactone (ALDACTONE) 25 MG tablet Take 0.5 tablets (12.5 mg total) by mouth daily. (Patient not taking: Reported on 08/29/2021) 15 tablet 2 Not Taking   tamsulosin (FLOMAX)  0.4 MG CAPS capsule TAKE 1 CAPSULE (0.4 MG TOTAL) BY MOUTH DAILY AFTER BREAKFAST. (Patient not taking: Reported on 08/29/2021) 30 capsule 0 Not Taking    Scheduled:   Chlorhexidine Gluconate Cloth  6 each Topical Daily   colchicine  0.3 mg Oral Daily   feeding supplement  237 mL Oral TID BM   montelukast  5 mg Oral QHS   multivitamin with minerals  1 tablet Oral Daily   mupirocin ointment  1 application Nasal BID   rosuvastatin  10 mg Oral Daily   senna-docusate  2 tablet Oral QHS   sodium chloride flush  10-40 mL Intracatheter Q12H   sodium chloride flush  3 mL Intravenous Q12H   sodium chloride flush  3 mL Intravenous Q12H   sodium chloride flush  3 mL Intravenous Q12H   Infusions:   sodium chloride Stopped (09/10/21 1119)   amiodarone 30 mg/hr (09/12/21 1100)   ceFEPime (MAXIPIME) IV Stopped (09/11/21 1000)   EPINEPHrine NaCl     heparin 50 units/mL (Impella PURGE) in dextrose 5 % 1000 mL bag     heparin 650 Units/hr (09/12/21 1100)   milrinone 0.375 mcg/kg/min (09/12/21 1100)   norepinephrine (LEVOPHED)  Adult infusion Stopped (09/12/21 1029)   [START ON 09/13/2021] vancomycin     PRN: sodium chloride, sodium chloride, acetaminophen, albuterol, morphine injection, ondansetron (ZOFRAN) IV, oxyCODONE, sodium chloride flush, sodium chloride flush  Assessment: 34 yom with a history of stage of systolic CHF chronic LVEF less than 20%, status post AICD/PPM, PAF on Eliquis, CKD stage IIIb, HTN, HLD. Patient is presenting with weakness, near syncope, and poor oral intake. Heparin per pharmacy consult placed for atrial fibrillation in preparation for RHC on Monday.  Impella cancelled 12/21 due to fever, now placed 12/23. Heparin level 0.3 is therapeutic on 650 units/hr prior to impella placement. No issues with infusion or bleeding noted overnight. Hgb stable 11s, plt stable 180.   Heparin impella purge providing ~450 units/hr of heparin, will reduce systemic heparin to 200 units/hr and recheck level tonight. No complications noted postop.   Goal of Therapy:  Heparin level 0.3-0.5 units/ml Monitor platelets by anticoagulation protocol: Yes   Plan:  Reduce systemic heparin to 200 units/hr Heparin level 5a/5p Cefepime 2g q24 hours Vancomycin 1g given as prophylaxis this am, will discontinue dose scheduled for this afternoon and check random level in am   Erin Hearing PharmD., BCPS Clinical Pharmacist 09/12/2021 12:00 PM

## 2021-09-12 NOTE — Progress Notes (Addendum)
Patient ID: Keenen Roessner, male   DOB: December 08, 1950, 70 y.o.   MRN: [id number]     Advanced Heart Failure Rounding Note  PCP-Cardiologist: None   Subjective:    12/19 RHC- RA 7, PA 39/14 (25), PCWP 11, CO 6.4 CI 3. Thermo 3.5 1.8. Lasix drip stopped after cath. 12/20 Swan removed.  Norepi 3 mcg added.  12/21 Fever. Blood CX drawn. UA + leukocytes. Started on vanc and cefepime. Diuresed with IV lasix + metolazone.  12/23 Impella 5.5 placed  Seen this afternoon after Impella placement.  Scant oozing noted from axillary site.  Remains on milrinone 0.375 + 5 NE   CO-OX 58% on P7  SWAN #s CVP 8 PA 47/24 CO 3.72 CI 1.94  Creatinine 2.14 > 2.40 > 2.24 > 2.38 K 3.4 Afebrile     Objective:   Weight Range: 76.3 kg Body mass index is 24.84 kg/m.   Vital Signs:   Temp:  [96.3 F (35.7 C)-98.5 F (36.9 C)] 97 F (36.1 C) (12/23 1530) Pulse Rate:  [73-103] 88 (12/23 1530) Resp:  [13-27] 13 (12/23 1530) BP: (88-120)/(61-92) 120/63 (12/23 0721) SpO2:  [92 %-100 %] 96 % (12/23 1530) Arterial Line BP: (97-118)/(64-76) 97/69 (12/23 1530) Weight:  [76.3 kg] 76.3 kg (12/23 0500) Last BM Date: 09/10/21  Weight change: Filed Weights   09/10/21 0630 09/11/21 0500 09/12/21 0500  Weight: 74 kg 74.9 kg 76.3 kg    Intake/Output:   Intake/Output Summary (Last 24 hours) at 09/12/2021 1616 Last data filed at 09/12/2021 1600 Gross per 24 hour  Intake 3125.75 ml  Output 850 ml  Net 2275.75 ml      Physical Exam   CVP 8 General:  Lying comfortably in bed HEENT: normal Neck: supple. no JVD. Carotids 2+ bilat; no bruits.  Cor: Regular rate & rhythm. No rubs, gallops or murmurs. Lungs: clear Abdomen: soft, nontender, nondistended. No hepatosplenomegaly.  Extremities: no cyanosis, clubbing, rash, edema, R axillary Impella with mild oozing Neuro: alert & orientedx3, cranial nerves grossly intact. moves all 4 extremities w/o difficulty. Affect pleasant    Telemetry   AF  80s-90s  Labs    CBC Recent Labs    09/11/21 0145 09/12/21 0240 09/12/21 0909  WBC 7.7 6.7  --   HGB 11.7* 11.7* 11.9*  HCT 33.2* 32.7* 35.0*  MCV 86.9 86.3  --   PLT 180 182  --    Basic Metabolic Panel  Recent Labs    09/11/21 0145 09/11/21 1332 09/12/21 0220 09/12/21 0909  NA 128* 125* 126* 128*  K 3.9 3.2* 3.4* 3.8  CL 92* 86* 89* 91*  CO2 25 27 27   --   GLUCOSE 113* 153* 150* 131*  BUN 38* 37* 45* 40*  CREATININE 2.40* 2.24* 2.38* 2.30*  CALCIUM 8.4* 8.3* 8.3*  --   MG 1.8  --  2.6*  --    Liver Function Tests No results for input(s): AST, ALT, ALKPHOS, BILITOT, PROT, ALBUMIN in the last 72 hours.  No results for input(s): LIPASE, AMYLASE in the last 72 hours. Cardiac Enzymes No results for input(s): CKTOTAL, CKMB, CKMBINDEX, TROPONINI in the last 72 hours.  BNP: BNP (last 3 results) Recent Labs    08/22/21 0510 09/04/21 1443 09/06/21 1243  BNP 1,593.4* 1,644.2* 1,614.8*    ProBNP (last 3 results) No results for input(s): PROBNP in the last 8760 hours.   D-Dimer No results for input(s): DDIMER in the last 72 hours. Hemoglobin A1C No results for input(s): HGBA1C  in the last 72 hours. Fasting Lipid Panel No results for input(s): CHOL, HDL, LDLCALC, TRIG, CHOLHDL, LDLDIRECT in the last 72 hours.  Thyroid Function Tests Recent Labs    09/11/21 0145  T3FREE 2.0    Other results:   Imaging    DG Chest Port 1 View  Result Date: 09/12/2021 CLINICAL DATA:  Status post Impella placement EXAM: PORTABLE CHEST 1 VIEW COMPARISON:  09/09/2021 FINDINGS: Lungs are clear. Trace left pleural effusion. Mild left perihilar edema, equivocal. No pneumothorax. Cardiomegaly. Left subclavian ICD. Right IJ Swan-Ganz catheter tip in the right main pulmonary artery. Right subclavian Impella LVAD overlying the inferior aspect of the left ventricle. Skin staples along the right axilla. IMPRESSION: Right subclavian Impella LVAD in satisfactory position. Additional  support apparatus as above. Trace left pleural effusion.  Mild left perihilar edema, equivocal. Electronically Signed   By: Julian Hy M.D.   On: 09/12/2021 11:51   DG C-Arm 1-60 Min-No Report  Result Date: 09/12/2021 Fluoroscopy was utilized by the requesting physician.  No radiographic interpretation.   DG C-Arm 1-60 Min-No Report  Result Date: 09/12/2021 Fluoroscopy was utilized by the requesting physician.  No radiographic interpretation.   ECHO INTRAOPERATIVE TEE  Result Date: 09/12/2021  *INTRAOPERATIVE TRANSESOPHAGEAL REPORT *  Patient Name:   UMBERTO PAVEK Date of Exam: 09/12/2021 Medical Rec #:  [id number]           Height:       69.0 in Accession #:    [id number]          Weight:       168.2 lb Date of Birth:  11-29-1950            BSA:          1.92 m Patient Age:    23 years            BP:           100/55 mmHg Patient Gender: M                   HR:           100 bpm. Exam Location:  Anesthesiology Transesophogeal exam was perform intraoperatively during surgical procedure. Patient was closely monitored under general anesthesia during the entirety of examination. Indications:     I50.23 Acute on chronic systolic (congestive) heart failure Performing Phys: Renold Don MD Diagnosing Phys: Renold Don MD Complications: No known complications during this procedure. POST-OP IMPRESSIONS _ Left Ventricle: Impella in LV cavity, tip 5.1cm from AV. _ Right Ventricle: The right ventricle appears unchanged from pre-bypass. _ Aorta: The aorta appears unchanged from pre-bypass. _ Left Atrium: The left atrium appears unchanged from pre-bypass. _ Left Atrial Appendage: The left atrial appendage appears unchanged from pre-bypass. _ Aortic Valve: The aortic valve appears unchanged from pre-bypass. _ Mitral Valve: The mitral valve appears unchanged from pre-bypass. _ Tricuspid Valve: The tricuspid valve appears unchanged from pre-bypass. _ Pulmonic Valve: The pulmonic valve appears unchanged  from pre-bypass. _ Interatrial Septum: The interatrial septum appears unchanged from pre-bypass. _ Interventricular Septum: The interventricular septum appears unchanged from pre-bypass. _ Pericardium: The pericardium appears unchanged from pre-bypass. PRE-OP FINDINGS  Left Ventricle: The left ventricle EF under 20%. The cavity size was moderately dilated. Left ventrical global hypokinesis without regional wall motion abnormalities. There is no left ventricular hypertrophy. Right Ventricle: The right ventricle has severely reduced systolic function. The cavity was dialated. There is no increase in right ventricular wall thickness.  Left Atrium: Left atrial size was dilated. There is continuous echo contrast seen in the left atrial cavity, left atrial appendage and left ventricle. Right Atrium: Right atrial size was dilated. Interatrial Septum: No atrial level shunt detected by color flow Doppler. Pericardium: There is no evidence of pericardial effusion. Mitral Valve: The mitral valve is normal in structure. Mitral valve regurgitation mild to moderate. There is No evidence of mitral stenosis. Tricuspid Valve: The tricuspid valve was normal in structure. Tricuspid valve regurgitation is severe by color flow Doppler. Aortic Valve: The aortic valve is tricuspid Aortic valve regurgitation was not visualized by color flow Doppler. There is no stenosis of the aortic valve. Pulmonic Valve: The pulmonic valve was normal in structure. Pulmonic valve regurgitation is mild by color flow Doppler. Aorta: The aortic root, ascending aorta and aortic arch are normal in size and structure. Pulmonary Artery: The pulmonary artery is not well seen.  Renold Don MD Electronically signed by Renold Don MD Signature Date/Time: 09/12/2021/10:10:31 AM    Final      Medications:     Scheduled Medications:  Chlorhexidine Gluconate Cloth  6 each Topical Daily   colchicine  0.3 mg Oral Daily   feeding supplement  237 mL Oral TID BM    montelukast  5 mg Oral QHS   multivitamin with minerals  1 tablet Oral Daily   mupirocin ointment  1 application Nasal BID   rosuvastatin  10 mg Oral Daily   senna-docusate  2 tablet Oral QHS   sodium chloride flush  10-40 mL Intracatheter Q12H   sodium chloride flush  3 mL Intravenous Q12H   sodium chloride flush  3 mL Intravenous Q12H   sodium chloride flush  3 mL Intravenous Q12H    Infusions:  sodium chloride Stopped (09/10/21 1119)   amiodarone 30 mg/hr (09/12/21 1540)   ceFEPime (MAXIPIME) IV Stopped (09/11/21 1000)   EPINEPHrine NaCl     heparin 50 units/mL (Impella PURGE) in dextrose 5 % 1000 mL bag     lactated ringers 20 mL/hr at 09/12/21 1500   milrinone 0.375 mcg/kg/min (09/12/21 1500)   norepinephrine (LEVOPHED) Adult infusion 5 mcg/min (09/12/21 1100)   [START ON 09/13/2021] vancomycin      PRN Medications: sodium chloride, sodium chloride, acetaminophen, albuterol, lactated ringers, morphine injection, ondansetron (ZOFRAN) IV, oxyCODONE, sodium chloride flush, sodium chloride flush    Assessment/Plan   1. Acute on chronic systolic CHF:  Long-standing cardiomyopathy.  [place].  Echo this admission with EF < 20%, severe LV dilation, restrictive diastolic function, moderate RV dysfunction, moderate MR, mod-severe TR. Cause of cardiomyopathy is uncertain.  He has a history of CAD, but I do not think that the described CAD from the past could explain his cardiomyopathy, but CAD could have progressed.  With difficulty tolerating GDMT/need for midodrine and cardiorenal syndrome as well as profound volume overload,  concerned for low output HF.  Co-ox off milrinone was 36%, milrinone started and increased to 0.375.   - 12/19 RHC on milrinone 0.375 mcg, stable filling pressures/CO 6 and CI 3 but thermo CO 3.5 CI 1.8 .  - S/p Impella 5.5 12/23  - CO-OX 58%. Continue Milrinone 0.375 mcg + Norepi 5 + P7 - CVP 8. No diuresis today.  - LDH okay -  Will need eventual  coronary angiography but in absence of ACS, would wait until creatinine stabilizes. Discussed risks/benefits with patient and he agrees to procedure.  - Will need to try to get him back  into NSR (see below).  - He has been  RV pacing frequently which is not ideal, will need to see if this is improved by regaining NSR.  - EP adjusted his device to try to decrease RV pacing.  -Possible candidate for VAD if renal function improves/stabilizes.  RV looks moderately dysfunctional on my assessment of echo.  2. Atrial fibrillation: Persistent since 10/22 based on device interrogation.  Would benefit from resumption of NSR.  -Remains in A fib - Continue amio at 30/hr - Reduce systemic heparin to 200 u/hr 3. CKD stage 3: Creatinine 1.99 => 2.14=>2.4  - Holding lasix as above.  4. Syncope: Prior to admission, stood up then passed out.  ?Orthostatic versus arrhythmic.  He had VT run terminated by ATP that may have been around the time of syncope.   - Now on amiodarone gtt.  5. Elevated TSH/elevated free T4: Mixed picture, ?sick euthyroid.   Free T3 pending.   6. CAD: History of PCI to OM1 in 2007 and RCA in 2013.   - No chest pain.   - Would aim for coronary angiography once creatinine stabilizes - Continue Crestor.  7. Malnutrition -Prealbumin 7.8  -Consult dietitian 8. Fever  - Had fever 12/21.  -Afebrile. WBC 7.7 - Pro calcitonin 1.03  -12/21 Blood Cultures x2  - UA with moderate leukocytes. Urine culture with multiple species. Needs recollection. - Had LUE IV infiltrate.  - Continue vanc + cefepime.  9. L Pleural Effusion  Noted on CT 10. Gout  -Uric Acid 13.3. He does not think he is having a gout flare.  - May need prednisone but hold off with infection. 11. Elevated PSA PSA 7.2  12. Hypokalemia - Supp K    Length of Stay: 6  FINCH, LINDSAY N, PA-C  09/12/2021, 4:16 PM  Advanced Heart Failure Team Pager [phone] (M-F; 7a - 5p)  Please contact [facility] for  night-coverage after hours (5p -7a ) and weekends on [email or web address]  Agree with above.   Underwent Impella 5.5 placement in OR today without complication.   Swan numbers ok. CVP 10   Now back from OR. Feels ok. Denies SOB, orthopnea or PND. Chest a little sore.   Remains in AF. Scr remains elevated at 2.4. On IV abx for UTI  General:  Lying in bed . No resp difficulty HEENT: normal Neck: supple. RIJ swan .  Cor: Impell site ok with mild ooze  Irregular rate & rhythm. No rubs, gallops or murmurs. Lungs: clear Abdomen: soft, nontender, nondistended. No hepatosplenomegaly. No bruits or masses. Good bowel sounds. Extremities: no cyanosis, clubbing, rash, edema Neuro: alert & orientedx3, cranial nerves grossly intact. moves all 4 extremities w/o difficulty. Affect pleasant  He remains very tenuous. Now s/p Impella 5.5 placement.   EF in OR very low. Impella 5.5 given unknown placement signal due to very low native cardiac output (essentially completely pump dependent). Continue Impell a support. Follow swan numbers   Keep CVP ~10  Continue milrinone and NE for RV support. Can wean gently as tolerated.   Continue abx. If Scr improves will plan durable VAD soon. Dr. Cyndia Bent to see next week.   Keep systemic heparin off today (keep heparin in purge only). Can restart systemic heparin tomorrow.   CRITICAL CARE Performed by: Glori Bickers  Total critical care time: 90 minutes  Critical care time was exclusive of separately billable procedures and treating other patients.  Critical care was necessary to treat or prevent imminent or life-threatening  deterioration.  Critical care was time spent personally by me (independent of midlevel providers or residents) on the following activities: development of treatment plan with patient and/or surrogate as well as nursing, discussions with consultants, evaluation of patient's response to treatment, examination of patient, obtaining history from  patient or surrogate, ordering and performing treatments and interventions, ordering and review of laboratory studies, ordering and review of radiographic studies, pulse oximetry and re-evaluation of patient's condition.  Glori Bickers, MD  5:03 PM

## 2021-09-12 NOTE — Anesthesia Postprocedure Evaluation (Signed)
Anesthesia Post Note  Patient: Isaac [name]  Procedure(s) Performed: PLACEMENT OF IMPELLA 5.5 LEFT VENTRICULAR ASSIST DEVICE TRANSESOPHAGEAL ECHOCARDIOGRAM (TEE)     Patient location during evaluation: ICU Anesthesia Type: General Level of consciousness: awake and alert Pain management: pain level controlled Vital Signs Assessment: post-procedure vital signs reviewed and stable Respiratory status: spontaneous breathing, nonlabored ventilation, respiratory function stable and patient connected to nasal cannula oxygen Cardiovascular status: blood pressure returned to baseline Postop Assessment: no apparent nausea or vomiting Anesthetic complications: no   No notable events documented.  Last Vitals:  Vitals:   09/12/21 0723 09/12/21 0724  BP:    Pulse: 84 88  Resp: 16 20  Temp:    SpO2: 98% 99%    Last Pain:  Vitals:   09/12/21 0415  TempSrc: Oral  PainSc:                  Audry Pili

## 2021-09-12 NOTE — Anesthesia Procedure Notes (Signed)
Central Venous Catheter Insertion Performed by: Audry Pili, MD, anesthesiologist Start/End12/23/2022 7:10 AM, 09/12/2021 7:12 AM Patient location: Pre-op. Preanesthetic checklist: patient identified, IV checked, risks and benefits discussed, surgical consent, monitors and equipment checked, pre-op evaluation, timeout performed and anesthesia consent Position: Trendelenburg Hand hygiene performed  and maximum sterile barriers used  Total catheter length 10. PA cath was placed.Swan type:thermodilution PA Cath depth:48 Procedure performed without using ultrasound guided technique. Attempts: 1 Patient tolerated the procedure well with no immediate complications.

## 2021-09-12 NOTE — Interval H&P Note (Signed)
History and Physical Interval Note:  09/12/2021 7:38 AM  Isaac [name]  has presented today for surgery, with the diagnosis of Congestive Heart Failure.  The various methods of treatment have been discussed with the patient and family. After consideration of risks, benefits and other options for treatment, the patient has consented to  Procedure(s): PLACEMENT OF IMPELLA 5.5 LEFT VENTRICULAR ASSIST DEVICE (N/A) TRANSESOPHAGEAL ECHOCARDIOGRAM (TEE) (N/A) as a surgical intervention.  The patient's history has been reviewed, patient examined, no change in status, stable for surgery.  I have reviewed the patient's chart and labs.  Questions were answered to the patient's satisfaction.     Melrose Nakayama

## 2021-09-12 NOTE — Progress Notes (Signed)
eLink Physician-Brief Progress Note Patient Name: Isaac [name] DOB: 12/13/1950 MRN: [id number]   Date of Service  09/12/2021  HPI/Events of Note  Notified of K 3.4, creatinine 2.38 Mg 2.6  eICU Interventions  Re-ordered a one time dose K 40 meqs PO     Intervention Category Intermediate Interventions: Electrolyte abnormality - evaluation and management  Judd Lien 09/12/2021, 4:07 AM

## 2021-09-12 NOTE — Anesthesia Procedure Notes (Signed)
Procedure Name: Intubation Date/Time: 09/12/2021 8:07 AM Performed by: Barrington Ellison, CRNA Pre-anesthesia Checklist: Patient identified, Emergency Drugs available, Suction available and Patient being monitored Patient Re-evaluated:Patient Re-evaluated prior to induction Oxygen Delivery Method: Circle System Utilized Preoxygenation: Pre-oxygenation with 100% oxygen Induction Type: IV induction Ventilation: Mask ventilation without difficulty Laryngoscope Size: Mac and 4 Grade View: Grade I Tube type: Oral Tube size: 8.0 mm Number of attempts: 1 Airway Equipment and Method: Stylet and Oral airway Placement Confirmation: ETT inserted through vocal cords under direct vision, positive ETCO2 and breath sounds checked- equal and bilateral Secured at: 22 cm Tube secured with: Tape Dental Injury: Teeth and Oropharynx as per pre-operative assessment

## 2021-09-12 NOTE — Anesthesia Procedure Notes (Signed)
Arterial Line Insertion Start/End12/23/2022 7:00 AM, 09/12/2021 7:05 AM Performed by: Barrington Ellison, CRNA, CRNA  Patient location: Pre-op. Preanesthetic checklist: patient identified, IV checked and risks and benefits discussed Lidocaine 1% used for infiltration and patient sedated Left, radial was placed Catheter size: 20 G Hand hygiene performed  and maximum sterile barriers used   Procedure performed without using ultrasound guided technique. Following insertion, dressing applied and Biopatch. Post procedure assessment: normal  Patient tolerated the procedure well with no immediate complications.

## 2021-09-12 NOTE — Plan of Care (Signed)
  Problem: Clinical Measurements: Goal: Ability to maintain clinical measurements within normal limits will improve Outcome: Progressing Goal: Will remain free from infection Outcome: Progressing   Problem: Nutrition: Goal: Adequate nutrition will be maintained Outcome: Progressing   

## 2021-09-13 LAB — BASIC METABOLIC PANEL
Anion gap: 8 (ref 5–15)
Anion gap: 10 (ref 5–15)
BUN: 40 mg/dL — ABNORMAL HIGH (ref 8–23)
BUN: 41 mg/dL — ABNORMAL HIGH (ref 8–23)
CO2: 26 mmol/L (ref 22–32)
CO2: 25 mmol/L (ref 22–32)
Calcium: 8.6 mg/dL — ABNORMAL LOW (ref 8.9–10.3)
Calcium: 8.7 mg/dL — ABNORMAL LOW (ref 8.9–10.3)
Chloride: 92 mmol/L — ABNORMAL LOW (ref 98–111)
Chloride: 91 mmol/L — ABNORMAL LOW (ref 98–111)
Creatinine, Ser: 1.85 mg/dL — ABNORMAL HIGH (ref 0.61–1.24)
Creatinine, Ser: 1.98 mg/dL — ABNORMAL HIGH (ref 0.61–1.24)
GFR, Estimated: 39 mL/min — ABNORMAL LOW (ref >60)
GFR, Estimated: 36 mL/min — ABNORMAL LOW (ref >60)
Glucose, Bld: 133 mg/dL — ABNORMAL HIGH (ref 70–99)
Glucose, Bld: 113 mg/dL — ABNORMAL HIGH (ref 70–99)
Potassium: 3.7 mmol/L (ref 3.5–5.1)
Potassium: 3.6 mmol/L (ref 3.5–5.1)
Sodium: 126 mmol/L — ABNORMAL LOW (ref 135–145)
Sodium: 126 mmol/L — ABNORMAL LOW (ref 135–145)

## 2021-09-13 LAB — POCT I-STAT 7, (LYTES, BLD GAS, ICA,H+H)
Acid-Base Excess: 4 mmol/L — ABNORMAL HIGH (ref 0.0–2.0)
Bicarbonate: 27.7 mmol/L (ref 20.0–28.0)
Calcium, Ion: 1.19 mmol/L (ref 1.15–1.40)
HCT: 34 % — ABNORMAL LOW (ref 39.0–52.0)
Hemoglobin: 11.6 g/dL — ABNORMAL LOW (ref 13.0–17.0)
O2 Saturation: 94 %
Patient temperature: 36.5
Potassium: 3.7 mmol/L (ref 3.5–5.1)
Sodium: 128 mmol/L — ABNORMAL LOW (ref 135–145)
TCO2: 29 mmol/L (ref 22–32)
pCO2 arterial: 36.8 mmHg (ref 32.0–48.0)
pH, Arterial: 7.482 — ABNORMAL HIGH (ref 7.350–7.450)
pO2, Arterial: 65 mmHg — ABNORMAL LOW (ref 83.0–108.0)

## 2021-09-13 LAB — COOXEMETRY PANEL
Carboxyhemoglobin: 1.7 % — ABNORMAL HIGH (ref 0.5–1.5)
Methemoglobin: 0.8 % (ref 0.0–1.5)
O2 Saturation: 56.7 %
Total hemoglobin: 10.4 g/dL — ABNORMAL LOW (ref 12.0–16.0)

## 2021-09-13 LAB — T3, FREE: T3, Free: 2.5 pg/mL (ref 2.0–4.4)

## 2021-09-13 LAB — CBC
HCT: 30.2 % — ABNORMAL LOW (ref 39.0–52.0)
Hemoglobin: 10.8 g/dL — ABNORMAL LOW (ref 13.0–17.0)
MCH: 31.2 pg (ref 26.0–34.0)
MCHC: 35.8 g/dL (ref 30.0–36.0)
MCV: 87.3 fL (ref 80.0–100.0)
Platelets: 165 10*3/uL (ref 150–400)
RBC: 3.46 MIL/uL — ABNORMAL LOW (ref 4.22–5.81)
RDW: 16.1 % — ABNORMAL HIGH (ref 11.5–15.5)
WBC: 5.7 10*3/uL (ref 4.0–10.5)
nRBC: 0 % (ref 0.0–0.2)

## 2021-09-13 LAB — URINE CULTURE: Culture: NO GROWTH

## 2021-09-13 LAB — VANCOMYCIN, RANDOM: Vancomycin Rm: 14

## 2021-09-13 LAB — MAGNESIUM
Magnesium: 2.2 mg/dL (ref 1.7–2.4)
Magnesium: 2.2 mg/dL (ref 1.7–2.4)

## 2021-09-13 LAB — HEPARIN LEVEL (UNFRACTIONATED)
Heparin Unfractionated: 0.12 IU/mL — ABNORMAL LOW (ref 0.30–0.70)
Heparin Unfractionated: <0.1 IU/mL — ABNORMAL LOW (ref 0.30–0.70)

## 2021-09-13 LAB — LACTATE DEHYDROGENASE: LDH: 272 U/L — ABNORMAL HIGH (ref 98–192)

## 2021-09-13 LAB — T4, FREE: Free T4: 1.56 ng/dL — ABNORMAL HIGH (ref 0.61–1.12)

## 2021-09-13 MED ORDER — POTASSIUM CHLORIDE CRYS ER 20 MEQ PO TBCR
40.0000 meq | EXTENDED_RELEASE_TABLET | Freq: Once | ORAL | Status: AC
  Administered 2021-09-13: 10:00:00 40 meq via ORAL
  Filled 2021-09-13: qty 2

## 2021-09-13 MED ORDER — "THROMBI-PAD 3""X3"" EX PADS": 1.0000 | MEDICATED_PAD | Freq: Once | CUTANEOUS | Status: DC

## 2021-09-13 MED ORDER — "THROMBI-PAD 3""X3"" EX PADS"
1.0000 | MEDICATED_PAD | CUTANEOUS | Status: DC | PRN
  Filled 2021-09-13 (×2): qty 1

## 2021-09-13 NOTE — Progress Notes (Signed)
[facility] for Heparin Indication: atrial fibrillation, impella 5.5  Allergies  Allergen Reactions   Mushroom Extract Complex Nausea And Vomiting   Neosporin [Neomycin-Bacitracin Zn-Polymyx] Hives    Patient Measurements: Height: 5\' 9"  (175.3 cm) Weight: 79 kg (174 lb 2.6 oz) (1 pillow and 1 blanket) IBW/kg (Calculated) : 70.7 Heparin Dosing Weight: 81.6 kg  Vital Signs: Temp: 98.1 F (36.7 C) (12/24 1230) Temp Source: Core (12/24 0800) Pulse Rate: 91 (12/24 1230)  Labs: Recent Labs    09/12/21 0220 09/12/21 0240 09/12/21 0909 09/12/21 1628 09/12/21 2010 09/13/21 0020 09/13/21 0515 09/13/21 0517  HGB  --  11.7*   < > 11.3*  --   --  11.6* 10.8*  HCT  --  32.7*   < > 32.0*  --   --  34.0* 30.2*  PLT  --  182  --  169  --   --   --  165  HEPARINUNFRC 0.31  --   --   --  0.16*  --   --  0.12*  CREATININE 2.38*  --    < > 2.17*  --  1.85*  --  1.98*   < > = values in this interval not displayed.     Estimated Creatinine Clearance: 35.2 mL/min (A) (by C-G formula based on SCr of 1.98 mg/dL (H)).   Medical History: Past Medical History:  Diagnosis Date   Arrhythmia    atrial fibrillation   CHF (congestive heart failure) (HCC)    Chronic kidney disease    Coronary artery disease    Hyperlipidemia    Hypertension    Myocardial infarct (HCC)     Medications:  Medications Prior to Admission  Medication Sig Dispense Refill Last Dose   albuterol (VENTOLIN HFA) 108 (90 Base) MCG/ACT inhaler Inhale 2 puffs into the lungs every 6 (six) hours as needed.   unknown   apixaban (ELIQUIS) 5 MG TABS tablet Take 1 tablet (5 mg total) by mouth 2 (two) times daily. 60 tablet 2 09/06/2021 at 0500   furosemide (LASIX) 20 MG tablet Take 1 tablet (20 mg total) by mouth daily as needed for edema or fluid (sob). (Patient taking differently: Take 20 mg by mouth daily.) 90 tablet 0 Past Week   midodrine (PROAMATINE) 2.5 MG tablet Take 1 tablet (2.5  mg total) by mouth 3 (three) times daily with meals. 90 tablet 2 09/06/2021   amiodarone (PACERONE) 200 MG tablet Take 1 tablet (200 mg total) by mouth 2 (two) times daily. (Patient not taking: Reported on 08/29/2021) 60 tablet 2 Not Taking   metoprolol tartrate (LOPRESSOR) 25 MG tablet Take 0.5 tablets (12.5 mg total) by mouth 2 (two) times daily. (Patient not taking: Reported on 08/29/2021) 30 tablet 2 Not Taking   montelukast (SINGULAIR) 10 MG tablet Take by mouth. (Patient not taking: Reported on 09/06/2021)   Not Taking   spironolactone (ALDACTONE) 25 MG tablet Take 0.5 tablets (12.5 mg total) by mouth daily. (Patient not taking: Reported on 08/29/2021) 15 tablet 2 Not Taking   tamsulosin (FLOMAX) 0.4 MG CAPS capsule TAKE 1 CAPSULE (0.4 MG TOTAL) BY MOUTH DAILY AFTER BREAKFAST. (Patient not taking: Reported on 08/29/2021) 30 capsule 0 Not Taking    Scheduled:   Chlorhexidine Gluconate Cloth  6 each Topical Daily   feeding supplement  237 mL Oral TID BM   montelukast  5 mg Oral QHS   multivitamin with minerals  1 tablet Oral Daily   rosuvastatin  10 mg Oral Daily   senna-docusate  2 tablet Oral QHS   sodium chloride flush  10-40 mL Intracatheter Q12H   sodium chloride flush  3 mL Intravenous Q12H   sodium chloride flush  3 mL Intravenous Q12H   sodium chloride flush  3 mL Intravenous Q12H   Thrombi-Pad  1 each Topical Once   Infusions:   sodium chloride Stopped (09/10/21 1119)   amiodarone 30 mg/hr (09/13/21 1100)   ceFEPime (MAXIPIME) IV Stopped (09/13/21 1018)   heparin 50 units/mL (Impella PURGE) in dextrose 5 % 1000 mL bag     lactated ringers 20 mL/hr at 09/13/21 1100   milrinone 0.375 mcg/kg/min (09/13/21 1100)   vancomycin 1,000 mg (09/13/21 1124)   PRN: sodium chloride, sodium chloride, acetaminophen, albuterol, lactated ringers, morphine injection, ondansetron (ZOFRAN) IV, oxyCODONE, sodium chloride flush, sodium chloride flush  Assessment: 63 yom with a history of stage of  systolic CHF chronic LVEF less than 20%, status post AICD/PPM, PAF on Eliquis, CKD stage IIIb, HTN, HLD. Patient presented with weakness, near syncope, and poor oral intake. Heparin per pharmacy consult placed for atrial fibrillation in preparation for RHC > impella and LVAD work up  [place] placed 12/23. Systemic heparin was stopped for implant and not resumed after with oozing at insertion site and slight hematuria. Still slight oozing this am but hematuria resolved.  Heparin purge (50 units/mL concentration Purge flow 56ml/hr = 450 uts/hr purge pressures stable 400-500 Hgb stable 10-12, pltc stable 160-200 LDH up slightly post insertion 200s watch  Heparin level 0.12 Keep purge only for today   Goal of Therapy:  Heparin level 0.3-0.5 units/ml Monitor platelets by anticoagulation protocol: Yes   Plan:  No systemic heparin  Continue heparin purge  Heparin level 5a/5p Monitor for s/sx of bleeding, daily HL   Bonnita Nasuti Pharm.D. CPP, BCPS Clinical Pharmacist [phone] 09/13/2021 12:46 PM    Please check AMION for all [place] phone numbers After 10:00 PM, call [place] [phone]

## 2021-09-13 NOTE — Progress Notes (Signed)
Patient ID: Isaac [name], male   DOB: 1951-02-09, 70 y.o.   MRN: [id number]     Advanced Heart Failure Rounding Note  PCP-Cardiologist: None   Subjective:    12/19 RHC- RA 7, PA 39/14 (25), PCWP 11, CO 6.4 CI 3. Thermo 3.5 1.8. Lasix drip stopped after cath. 12/20 Swan removed.  Norepi 3 mcg added.  12/21 Fever. Blood CX drawn. UA + leukocytes. Started on vanc and cefepime. Diuresed with IV lasix + metolazone.  12/23 Impella 5.5 placed  Impella 5.5 placed yesterday. Heparin only in purge.   Remains on milrinone 0.375. NE off.   CO-OX 57% on P7 -> 3.7L flow. Waveforms ok  SWAN #s CVP 6 PA 39/18 CO 4.6 CI 2.4  Creatinine 2.14 > 2.40 > 2.24 > 2.38 > 1.85 > 1.98  Remains afebrile. Cx remain negative. Denies SOB, orthopnea or PND.  Objective:   Weight Range: 79 kg Body mass index is 25.72 kg/m.   Vital Signs:   Temp:  [96.3 F (35.7 C)-97.9 F (36.6 C)] 97.7 F (36.5 C) (12/24 0700) Pulse Rate:  [69-111] 108 (12/24 0700) Resp:  [4-28] 19 (12/24 0700) SpO2:  [94 %-100 %] 97 % (12/24 0700) Arterial Line BP: (87-125)/(56-81) 108/77 (12/24 0700) Weight:  [79 kg] 79 kg (12/24 0600) Last BM Date: 09/10/21  Weight change: Filed Weights   09/11/21 0500 09/12/21 0500 09/13/21 0600  Weight: 74.9 kg 76.3 kg 79 kg    Intake/Output:   Intake/Output Summary (Last 24 hours) at 09/13/2021 0820 Last data filed at 09/13/2021 0700 Gross per 24 hour  Intake 2870.45 ml  Output 1368 ml  Net 1502.45 ml       Physical Exam   General:  Sitting up in bed No resp difficulty HEENT: normal Neck: supple. RIJ swan. Carotids 2+ bilat; no bruits. No lymphadenopathy or thryomegaly appreciated. Cor: Axillary impella site ok  PMI nondisplaced. Irregular rate & rhythm. No rubs, gallops or murmurs. Lungs: clear Abdomen: soft, nontender, nondistended. No hepatosplenomegaly. No bruits or masses. Good bowel sounds. Extremities: no cyanosis, clubbing, rash, edema Neuro: alert &  orientedx3, cranial nerves grossly intact. moves all 4 extremities w/o difficulty. Affect pleasant   Telemetry   AF 90-110 Personally reviewed  Labs    CBC Recent Labs    09/12/21 1628 09/13/21 0515 09/13/21 0517  WBC 4.4  --  5.7  HGB 11.3* 11.6* 10.8*  HCT 32.0* 34.0* 30.2*  MCV 87.7  --  87.3  PLT 169  --  625    Basic Metabolic Panel  Recent Labs    09/13/21 0020 09/13/21 0515 09/13/21 0517  NA 126* 128* 126*  K 3.7 3.7 3.6  CL 92*  --  91*  CO2 26  --  25  GLUCOSE 133*  --  113*  BUN 40*  --  41*  CREATININE 1.85*  --  1.98*  CALCIUM 8.6*  --  8.7*  MG 2.2  --  2.2    Liver Function Tests No results for input(s): AST, ALT, ALKPHOS, BILITOT, PROT, ALBUMIN in the last 72 hours.  No results for input(s): LIPASE, AMYLASE in the last 72 hours. Cardiac Enzymes No results for input(s): CKTOTAL, CKMB, CKMBINDEX, TROPONINI in the last 72 hours.  BNP: BNP (last 3 results) Recent Labs    08/22/21 0510 09/04/21 1443 09/06/21 1243  BNP 1,593.4* 1,644.2* 1,614.8*     ProBNP (last 3 results) No results for input(s): PROBNP in the last 8760 hours.   D-Dimer No  results for input(s): DDIMER in the last 72 hours. Hemoglobin A1C No results for input(s): HGBA1C in the last 72 hours. Fasting Lipid Panel No results for input(s): CHOL, HDL, LDLCALC, TRIG, CHOLHDL, LDLDIRECT in the last 72 hours.  Thyroid Function Tests Recent Labs    09/12/21 0220  T3FREE 2.5     Other results:   Imaging    DG Chest [facility] 1 View  Result Date: 09/12/2021 CLINICAL DATA:  Status post Impella placement EXAM: PORTABLE CHEST 1 VIEW COMPARISON:  09/09/2021 FINDINGS: Lungs are clear. Trace left pleural effusion. Mild left perihilar edema, equivocal. No pneumothorax. Cardiomegaly. Left subclavian ICD. Right IJ Swan-Ganz catheter tip in the right main pulmonary artery. Right subclavian Impella LVAD overlying the inferior aspect of the left ventricle. Skin staples along the  right axilla. IMPRESSION: Right subclavian Impella LVAD in satisfactory position. Additional support apparatus as above. Trace left pleural effusion.  Mild left perihilar edema, equivocal. Electronically Signed   By: Julian Hy M.D.   On: 09/12/2021 11:51   DG C-Arm 1-60 Min-No Report  Result Date: 09/12/2021 Fluoroscopy was utilized by the requesting physician.  No radiographic interpretation.   DG C-Arm 1-60 Min-No Report  Result Date: 09/12/2021 Fluoroscopy was utilized by the requesting physician.  No radiographic interpretation.     Medications:     Scheduled Medications:  Chlorhexidine Gluconate Cloth  6 each Topical Daily   colchicine  0.3 mg Oral Daily   feeding supplement  237 mL Oral TID BM   montelukast  5 mg Oral QHS   multivitamin with minerals  1 tablet Oral Daily   mupirocin ointment  1 application Nasal BID   rosuvastatin  10 mg Oral Daily   senna-docusate  2 tablet Oral QHS   sodium chloride flush  10-40 mL Intracatheter Q12H   sodium chloride flush  3 mL Intravenous Q12H   sodium chloride flush  3 mL Intravenous Q12H   sodium chloride flush  3 mL Intravenous Q12H    Infusions:  sodium chloride Stopped (09/10/21 1119)   amiodarone 30 mg/hr (09/13/21 0700)   ceFEPime (MAXIPIME) IV Stopped (09/11/21 1000)   EPINEPHrine NaCl     heparin 50 units/mL (Impella PURGE) in dextrose 5 % 1000 mL bag     lactated ringers 20 mL/hr at 09/13/21 0700   milrinone 0.375 mcg/kg/min (09/13/21 0700)   norepinephrine (LEVOPHED) Adult infusion Stopped (09/13/21 0318)   vancomycin      PRN Medications: sodium chloride, sodium chloride, acetaminophen, albuterol, lactated ringers, morphine injection, ondansetron (ZOFRAN) IV, oxyCODONE, sodium chloride flush, sodium chloride flush    Assessment/Plan   1. Acute on chronic systolic CHF:  Long-standing cardiomyopathy.  [place].  Echo this admission with EF < 20%, severe LV dilation, restrictive diastolic  function, moderate RV dysfunction, moderate MR, mod-severe TR. Cause of cardiomyopathy is uncertain.  He has a history of CAD, but I do not think that the described CAD from the past could explain his cardiomyopathy, but CAD could have progressed.  With difficulty tolerating GDMT/need for midodrine and cardiorenal syndrome as well as profound volume overload,  concerned for low output HF.  Co-ox off milrinone was 36%, milrinone started and increased to 0.375 but CO remained low. NE added and Impella 5.5 placed 12/23. EF 10% on TEE 12/23 - CO-ox 57% on milrinone 0.375 Impella P-7 - CVP 6 No diuresis today.  - LDH okay - Continue hemodynamic support. If SCr continues to improve (< 1.8). Will plan VAD placement. Dr.  Bartle aware  2. Atrial fibrillation: Persistent since 10/22 based on device interrogation.  Remains on amio for rate controlled.  - Rate remains mildly elevated on amio at 30. Will continue. Can consider DC-CV at time of VAD - Off systemic heparin for now with Impella placement. Can resume when op site stable. 3. AKI on CKD stage 3: Creatinine 1.99 => 2.14=>2.4 -> 1.85 -> 1.98 - Holding lasix as above.  - Hopefully will improved with hemodynamic support  4. Syncope: Prior to admission, stood up then passed out.  ?Orthostatic versus arrhythmic.  He had VT run terminated by ATP that may have been around the time of syncope.   - Now on amiodarone gtt.  5. Elevated TSH/elevated free T4: Mixed picture, ?sick euthyroid.   6. CAD: History of PCI to OM1 in 2007 and RCA in 2013.   - No s/s of angina - Deferring cardiac cath due to AKI and plan for VAD - Continue Crestor.  7. Malnutrition -Prealbumin 7.8  -Dietitian following 8. Fever  - Had fever 12/21.  - Now afebrile. WBC 5.7 - Pro calcitonin 1.03  -12/21 Blood Cultures x2  - Continue vanc + cefepime.  9. L Pleural Effusion  Noted on CT 10. Gout  -Uric Acid 13.3. He does not think he is having a gout flare.  - May need prednisone  but hold off with infection. 11. Elevated PSA PSA 7.2  12. Hypokalemia - Supp K 13. Hyponatremia - Restrict FW  CRITICAL CARE Performed by: Glori Bickers  Total critical care time: 45 minutes  Critical care time was exclusive of separately billable procedures and treating other patients.  Critical care was necessary to treat or prevent imminent or life-threatening deterioration.  Critical care was time spent personally by me (independent of midlevel providers or residents) on the following activities: development of treatment plan with patient and/or surrogate as well as nursing, discussions with consultants, evaluation of patient's response to treatment, examination of patient, obtaining history from patient or surrogate, ordering and performing treatments and interventions, ordering and review of laboratory studies, ordering and review of radiographic studies, pulse oximetry and re-evaluation of patient's condition.    Length of Stay: [place], MD  09/13/2021, 8:20 AM  Advanced Heart Failure Team Pager [phone] (M-F; 7a - 5p)  Please contact [facility] for night-coverage after hours (5p -7a ) and weekends on [email or web address]

## 2021-09-14 LAB — POCT I-STAT 7, (LYTES, BLD GAS, ICA,H+H)
Acid-Base Excess: 4 mmol/L — ABNORMAL HIGH (ref 0.0–2.0)
Bicarbonate: 27.9 mmol/L (ref 20.0–28.0)
Calcium, Ion: 1.21 mmol/L (ref 1.15–1.40)
HCT: 36 % — ABNORMAL LOW (ref 39.0–52.0)
Hemoglobin: 12.2 g/dL — ABNORMAL LOW (ref 13.0–17.0)
O2 Saturation: 96 %
Patient temperature: 36.7
Potassium: 4 mmol/L (ref 3.5–5.1)
Sodium: 129 mmol/L — ABNORMAL LOW (ref 135–145)
TCO2: 29 mmol/L (ref 22–32)
pCO2 arterial: 36.4 mmHg (ref 32.0–48.0)
pH, Arterial: 7.492 — ABNORMAL HIGH (ref 7.350–7.450)
pO2, Arterial: 75 mmHg — ABNORMAL LOW (ref 83.0–108.0)

## 2021-09-14 LAB — CBC
HCT: 30.8 % — ABNORMAL LOW (ref 39.0–52.0)
Hemoglobin: 10.7 g/dL — ABNORMAL LOW (ref 13.0–17.0)
MCH: 30.1 pg (ref 26.0–34.0)
MCHC: 34.7 g/dL (ref 30.0–36.0)
MCV: 86.5 fL (ref 80.0–100.0)
Platelets: 158 10*3/uL (ref 150–400)
RBC: 3.56 MIL/uL — ABNORMAL LOW (ref 4.22–5.81)
RDW: 16.3 % — ABNORMAL HIGH (ref 11.5–15.5)
WBC: 7 10*3/uL (ref 4.0–10.5)
nRBC: 0 % (ref 0.0–0.2)

## 2021-09-14 LAB — BASIC METABOLIC PANEL
Anion gap: 8 (ref 5–15)
BUN: 37 mg/dL — ABNORMAL HIGH (ref 8–23)
CO2: 26 mmol/L (ref 22–32)
Calcium: 8.9 mg/dL (ref 8.9–10.3)
Chloride: 92 mmol/L — ABNORMAL LOW (ref 98–111)
Creatinine, Ser: 1.7 mg/dL — ABNORMAL HIGH (ref 0.61–1.24)
GFR, Estimated: 43 mL/min — ABNORMAL LOW (ref >60)
Glucose, Bld: 115 mg/dL — ABNORMAL HIGH (ref 70–99)
Potassium: 3.9 mmol/L (ref 3.5–5.1)
Sodium: 126 mmol/L — ABNORMAL LOW (ref 135–145)

## 2021-09-14 LAB — COOXEMETRY PANEL
Carboxyhemoglobin: 1.7 % — ABNORMAL HIGH (ref 0.5–1.5)
Carboxyhemoglobin: 2 % — ABNORMAL HIGH (ref 0.5–1.5)
Methemoglobin: 0.8 % (ref 0.0–1.5)
Methemoglobin: 0.9 % (ref 0.0–1.5)
O2 Saturation: 59.9 %
O2 Saturation: 53.9 %
Total hemoglobin: 10.9 g/dL — ABNORMAL LOW (ref 12.0–16.0)
Total hemoglobin: 10.2 g/dL — ABNORMAL LOW (ref 12.0–16.0)

## 2021-09-14 LAB — HEPARIN LEVEL (UNFRACTIONATED): Heparin Unfractionated: <0.1 IU/mL — ABNORMAL LOW (ref 0.30–0.70)

## 2021-09-14 LAB — LACTATE DEHYDROGENASE: LDH: 383 U/L — ABNORMAL HIGH (ref 98–192)

## 2021-09-14 LAB — MAGNESIUM: Magnesium: 2.1 mg/dL (ref 1.7–2.4)

## 2021-09-14 MED ORDER — SODIUM CHLORIDE 0.9 % IV SOLN
2.0000 g | Freq: Two times a day (BID) | INTRAVENOUS | Status: DC
  Administered 2021-09-14 – 2021-09-16 (×5): 2 g via INTRAVENOUS
  Filled 2021-09-14 (×5): qty 2

## 2021-09-14 MED ORDER — SORBITOL 70 % SOLN
30.0000 mL | Freq: Once | Status: AC
  Administered 2021-09-14: 12:00:00 30 mL via ORAL
  Filled 2021-09-14: qty 30

## 2021-09-14 NOTE — Progress Notes (Signed)
Nurse contacted Cards about pt's CO 3, CI 1.6 and low pulsatility on Aline and impella    Continuing to monitor pt

## 2021-09-14 NOTE — Progress Notes (Signed)
Patient ID: Isaac [name], male   DOB: 08-01-1951, 70 y.o.   MRN: [id number]     Advanced Heart Failure Rounding Note  PCP-Cardiologist: None   Subjective:    12/19 RHC- RA 7, PA 39/14 (25), PCWP 11, CO 6.4 CI 3. Thermo 3.5 1.8. Lasix drip stopped after cath. 12/20 Swan removed.  Norepi 3 mcg added.  12/21 Fever. Blood CX drawn. UA + leukocytes. Started on vanc and cefepime. Diuresed with IV lasix + metolazone.  12/23 Impella 5.5 placed  Impella 5.5 placed 12/23  Heparin only in purge.   Remains on milrinone 0.375. NE off.   CO-OX 60% on P8 -> 3.9L flow. Waveforms ok   SWAN #s - done personally with RN CVP 10 PA 42/19 PCWP - unable to wedge CO 4.0 CI 2.1 PAPI  2.3  Creatinine 2.14 > 2.40 > 2.24 > 2.38 > 1.85 > 1.98 > 1.70  LDH  272 -> 383  Remains afebrile. Cx remains negative. Denies CP, SOB, orthopnea. + constipated  Objective:   Weight Range: 81.6 kg Body mass index is 26.57 kg/m.   Vital Signs:   Temp:  [97.7 F (36.5 C)-98.4 F (36.9 C)] 97.9 F (36.6 C) (12/25 0945) Pulse Rate:  [50-105] 69 (12/25 0945) Resp:  [11-22] 17 (12/25 0945) SpO2:  [93 %-98 %] 96 % (12/25 0945) Arterial Line BP: (92-132)/(67-98) 108/79 (12/25 0945) Weight:  [81.6 kg] 81.6 kg (12/25 0500) Last BM Date: 09/10/21  Weight change: Filed Weights   09/12/21 0500 09/13/21 0600 09/14/21 0500  Weight: 76.3 kg 79 kg 81.6 kg    Intake/Output:   Intake/Output Summary (Last 24 hours) at 09/14/2021 0958 Last data filed at 09/14/2021 0900 Gross per 24 hour  Intake 1825.69 ml  Output 1470 ml  Net 355.69 ml       Physical Exam   General:  Sitting up No resp difficulty HEENT: normal Neck: supple. no JVD. Carotids 2+ bilat; no bruits. No lymphadenopathy or thryomegaly appreciated. Cor: R axillary Impella. Irregular rate & rhythm. No rubs, gallops or murmurs. Lungs: clear Abdomen: soft, nontender, nondistended. No hepatosplenomegaly. No bruits or masses. Good bowel  sounds. Extremities: no cyanosis, clubbing, rash, edema Neuro: alert & orientedx3, cranial nerves grossly intact. moves all 4 extremities w/o difficulty. Affect pleasant   Telemetry   AF 80-90 Personally reviewed  Labs    CBC Recent Labs    09/13/21 0517 09/14/21 0256 09/14/21 0420  WBC 5.7  --  7.0  HGB 10.8* 12.2* 10.7*  HCT 30.2* 36.0* 30.8*  MCV 87.3  --  86.5  PLT 165  --  703    Basic Metabolic Panel  Recent Labs    09/13/21 0517 09/14/21 0256 09/14/21 0420  NA 126* 129* 126*  K 3.6 4.0 3.9  CL 91*  --  92*  CO2 25  --  26  GLUCOSE 113*  --  115*  BUN 41*  --  37*  CREATININE 1.98*  --  1.70*  CALCIUM 8.7*  --  8.9  MG 2.2  --  2.1    Liver Function Tests No results for input(s): AST, ALT, ALKPHOS, BILITOT, PROT, ALBUMIN in the last 72 hours.  No results for input(s): LIPASE, AMYLASE in the last 72 hours. Cardiac Enzymes No results for input(s): CKTOTAL, CKMB, CKMBINDEX, TROPONINI in the last 72 hours.  BNP: BNP (last 3 results) Recent Labs    08/22/21 0510 09/04/21 1443 09/06/21 1243  BNP 1,593.4* 5,009.3* 1,614.8*  ProBNP (last 3 results) No results for input(s): PROBNP in the last 8760 hours.   D-Dimer No results for input(s): DDIMER in the last 72 hours. Hemoglobin A1C No results for input(s): HGBA1C in the last 72 hours. Fasting Lipid Panel No results for input(s): CHOL, HDL, LDLCALC, TRIG, CHOLHDL, LDLDIRECT in the last 72 hours.  Thyroid Function Tests Recent Labs    09/12/21 0220  T3FREE 2.5     Other results:   Imaging    No results found.   Medications:     Scheduled Medications:  Chlorhexidine Gluconate Cloth  6 each Topical Daily   feeding supplement  237 mL Oral TID BM   montelukast  5 mg Oral QHS   multivitamin with minerals  1 tablet Oral Daily   rosuvastatin  10 mg Oral Daily   senna-docusate  2 tablet Oral QHS   sodium chloride flush  10-40 mL Intracatheter Q12H   sodium chloride flush  3 mL  Intravenous Q12H   sodium chloride flush  3 mL Intravenous Q12H    Infusions:  sodium chloride Stopped (09/10/21 1119)   amiodarone 30 mg/hr (09/14/21 0900)   ceFEPime (MAXIPIME) IV 2 g (09/14/21 0935)   heparin 50 units/mL (Impella PURGE) in dextrose 5 % 1000 mL bag     lactated ringers 20 mL/hr at 09/14/21 0900   milrinone 0.375 mcg/kg/min (09/14/21 0900)   vancomycin Stopped (09/13/21 1224)    PRN Medications: sodium chloride, sodium chloride, acetaminophen, albuterol, lactated ringers, morphine injection, ondansetron (ZOFRAN) IV, oxyCODONE, sodium chloride flush, sodium chloride flush, Thrombi-Pad    Assessment/Plan   1. Acute on chronic systolic CHF:  Long-standing cardiomyopathy.  [place].  Echo this admission with EF < 20%, severe LV dilation, restrictive diastolic function, moderate RV dysfunction, moderate MR, mod-severe TR. Cause of cardiomyopathy is uncertain.  He has a history of CAD, but I do not think that the described CAD from the past could explain his cardiomyopathy, but CAD could have progressed.  With difficulty tolerating GDMT/need for midodrine and cardiorenal syndrome as well as profound volume overload,  concerned for low output HF.  Co-ox off milrinone was 36%, milrinone started and increased to 0.375 but CO remained low. NE added and Impella 5.5 placed 12/23. EF 10% on TEE 12/23. CO-ox 60% on milrinone 0.375 Impella P-8 - CVP 6 -> 10 Can likely resume some diuresis in am - I increased impella to P-9 today. Position assessed on echo personally and looks good. - LDH  272 -> 383 - Continue hemodynamic support. SCr improved. Dr. Cyndia Bent to ss on Tuesday for possible durable VAD palcement. 2. Atrial fibrillation: Persistent since 10/22 based on device interrogation.  Remains on amio for rate controlled.  - Continue amio. Can consider DC-CV at time of VAD - Off systemic heparin for now with Impella placement. Can resume when op site stable. 3. AKI on CKD  stage 3: Creatinine 1.99 => 2.14=>2.4 -> 1.85 -> 1.98 -> 1.70 - Holding lasix as above.  - Continue hemodynamic support 4. Syncope: Prior to admission, stood up then passed out.  ?Orthostatic versus arrhythmic.  He had VT run terminated by ATP that may have been around the time of syncope.   - Now on amiodarone gtt.  5. Elevated TSH/elevated free T4: Mixed picture, ?sick euthyroid.   6. CAD: History of PCI to OM1 in 2007 and RCA in 2013.   - No s/s of angina - Deferring cardiac cath due to AKI and plan for VAD -  Continue Crestor.  7. Malnutrition -Prealbumin 7.8  -Dietitian following 8. Fever  - Had fever 12/21.  - Now afebrile. WBC 5.7 - Pro calcitonin 1.03  -12/21 Blood Cultures x2  - Continue vanc + cefepime.  9. L Pleural Effusion  Noted on CT 10. Gout  -Uric Acid 13.3. No evidence of gout flare 11. Elevated PSA PSA 7.2  12. Hypokalemia - K 3.9 Supp K 13. Hyponatremia - Na 126. Restrict FW - If drops further -> can give tolvaptan  CRITICAL CARE Performed by: Glori Bickers  Total critical care time: 45 minutes  Critical care time was exclusive of separately billable procedures and treating other patients.  Critical care was necessary to treat or prevent imminent or life-threatening deterioration.  Critical care was time spent personally by me (independent of midlevel providers or residents) on the following activities: development of treatment plan with patient and/or surrogate as well as nursing, discussions with consultants, evaluation of patient's response to treatment, examination of patient, obtaining history from patient or surrogate, ordering and performing treatments and interventions, ordering and review of laboratory studies, ordering and review of radiographic studies, pulse oximetry and re-evaluation of patient's condition.    Length of Stay: [place], MD  09/14/2021, 9:58 AM  Advanced Heart Failure Team Pager [phone] (M-F; 7a - 5p)   Please contact [facility] for night-coverage after hours (5p -7a ) and weekends on [email or web address]

## 2021-09-14 NOTE — Progress Notes (Signed)
[facility] for Heparin Indication: atrial fibrillation, impella 5.5  Allergies  Allergen Reactions   Mushroom Extract Complex Nausea And Vomiting   Neosporin [Neomycin-Bacitracin Zn-Polymyx] Hives    Patient Measurements: Height: 5\' 9"  (175.3 cm) Weight: 81.6 kg (179 lb 14.3 oz) IBW/kg (Calculated) : 70.7 Heparin Dosing Weight: 81.6 kg  Vital Signs: Temp: 97.7 F (36.5 C) (12/25 1130) Temp Source: Core (12/25 0800) Pulse Rate: 81 (12/25 1130)  Labs: Recent Labs    09/12/21 1628 09/12/21 2010 09/13/21 0020 09/13/21 0515 09/13/21 0517 09/13/21 1656 09/14/21 0256 09/14/21 0420  HGB 11.3*  --   --    < > 10.8*  --  12.2* 10.7*  HCT 32.0*  --   --    < > 30.2*  --  36.0* 30.8*  PLT 169  --   --   --  165  --   --  158  HEPARINUNFRC  --    < >  --   --  0.12* <0.10*  --  <0.10*  CREATININE 2.17*  --  1.85*  --  1.98*  --   --  1.70*   < > = values in this interval not displayed.     Estimated Creatinine Clearance: 41 mL/min (A) (by C-G formula based on SCr of 1.7 mg/dL (H)).   Medical History: Past Medical History:  Diagnosis Date   Arrhythmia    atrial fibrillation   CHF (congestive heart failure) (HCC)    Chronic kidney disease    Coronary artery disease    Hyperlipidemia    Hypertension    Myocardial infarct (HCC)     Medications:  Medications Prior to Admission  Medication Sig Dispense Refill Last Dose   albuterol (VENTOLIN HFA) 108 (90 Base) MCG/ACT inhaler Inhale 2 puffs into the lungs every 6 (six) hours as needed.   unknown   apixaban (ELIQUIS) 5 MG TABS tablet Take 1 tablet (5 mg total) by mouth 2 (two) times daily. 60 tablet 2 09/06/2021 at 0500   furosemide (LASIX) 20 MG tablet Take 1 tablet (20 mg total) by mouth daily as needed for edema or fluid (sob). (Patient taking differently: Take 20 mg by mouth daily.) 90 tablet 0 Past Week   midodrine (PROAMATINE) 2.5 MG tablet Take 1 tablet (2.5 mg total) by mouth 3  (three) times daily with meals. 90 tablet 2 09/06/2021   amiodarone (PACERONE) 200 MG tablet Take 1 tablet (200 mg total) by mouth 2 (two) times daily. (Patient not taking: Reported on 08/29/2021) 60 tablet 2 Not Taking   metoprolol tartrate (LOPRESSOR) 25 MG tablet Take 0.5 tablets (12.5 mg total) by mouth 2 (two) times daily. (Patient not taking: Reported on 08/29/2021) 30 tablet 2 Not Taking   montelukast (SINGULAIR) 10 MG tablet Take by mouth. (Patient not taking: Reported on 09/06/2021)   Not Taking   spironolactone (ALDACTONE) 25 MG tablet Take 0.5 tablets (12.5 mg total) by mouth daily. (Patient not taking: Reported on 08/29/2021) 15 tablet 2 Not Taking   tamsulosin (FLOMAX) 0.4 MG CAPS capsule TAKE 1 CAPSULE (0.4 MG TOTAL) BY MOUTH DAILY AFTER BREAKFAST. (Patient not taking: Reported on 08/29/2021) 30 capsule 0 Not Taking    Scheduled:   Chlorhexidine Gluconate Cloth  6 each Topical Daily   feeding supplement  237 mL Oral TID BM   montelukast  5 mg Oral QHS   multivitamin with minerals  1 tablet Oral Daily   rosuvastatin  10 mg Oral Daily  senna-docusate  2 tablet Oral QHS   sodium chloride flush  10-40 mL Intracatheter Q12H   sodium chloride flush  3 mL Intravenous Q12H   sodium chloride flush  3 mL Intravenous Q12H   Infusions:   sodium chloride Stopped (09/10/21 1119)   amiodarone 30 mg/hr (09/14/21 1100)   ceFEPime (MAXIPIME) IV     heparin 50 units/mL (Impella PURGE) in dextrose 5 % 1000 mL bag     lactated ringers 20 mL/hr at 09/14/21 1100   milrinone 0.375 mcg/kg/min (09/14/21 1133)   vancomycin 1,000 mg (09/14/21 1158)   PRN: sodium chloride, sodium chloride, acetaminophen, albuterol, lactated ringers, morphine injection, ondansetron (ZOFRAN) IV, oxyCODONE, sodium chloride flush, sodium chloride flush, Thrombi-Pad  Assessment: 71 yom with a history of stage of systolic CHF chronic LVEF less than 20%, status post AICD/PPM, PAF on Eliquis, CKD stage IIIb, HTN, HLD. Patient  presented with weakness, near syncope, and poor oral intake. Heparin per pharmacy consult placed for atrial fibrillation in preparation for RHC > impella and LVAD work up  [facility] placed 12/23. Systemic heparin was stopped for implant and not resumed after with oozing at insertion site and slight hematuria - now resolved.   Heparin purge (50 units/mL concentration Purge flow 88ml/hr = 400 uts/hr purge pressures stable 500s Heparin level < 0.1 less than goal - per MD continue purge only for now   Hgb stable 10-12, pltc stable 160-200 LDH up post insertion 380s -watch    Goal of Therapy:  Heparin level 0.3-0.5 units/ml Monitor platelets by anticoagulation protocol: Yes   Plan:  No systemic heparin  Continue heparin purge  Heparin level daily  Monitor for s/sx of bleeding,   Bonnita Nasuti Pharm.D. CPP, BCPS Clinical Pharmacist [phone] 09/14/2021 12:08 PM    Please check AMION for all [facility] phone numbers After 10:00 PM, call [facility] [phone]

## 2021-09-15 ENCOUNTER — Encounter (HOSPITAL_COMMUNITY): Payer: Self-pay | Admitting: Thoracic Surgery (Cardiothoracic Vascular Surgery)

## 2021-09-15 DIAGNOSIS — R570 Cardiogenic shock: Secondary | ICD-10-CM

## 2021-09-15 LAB — BASIC METABOLIC PANEL
Anion gap: 8 (ref 5–15)
BUN: 34 mg/dL — ABNORMAL HIGH (ref 8–23)
CO2: 25 mmol/L (ref 22–32)
Calcium: 8.5 mg/dL — ABNORMAL LOW (ref 8.9–10.3)
Chloride: 93 mmol/L — ABNORMAL LOW (ref 98–111)
Creatinine, Ser: 1.49 mg/dL — ABNORMAL HIGH (ref 0.61–1.24)
GFR, Estimated: 50 mL/min — ABNORMAL LOW (ref >60)
Glucose, Bld: 105 mg/dL — ABNORMAL HIGH (ref 70–99)
Potassium: 3.7 mmol/L (ref 3.5–5.1)
Sodium: 126 mmol/L — ABNORMAL LOW (ref 135–145)

## 2021-09-15 LAB — POCT I-STAT 7, (LYTES, BLD GAS, ICA,H+H)
Acid-Base Excess: 3 mmol/L — ABNORMAL HIGH (ref 0.0–2.0)
Bicarbonate: 26.3 mmol/L (ref 20.0–28.0)
Calcium, Ion: 1.19 mmol/L (ref 1.15–1.40)
HCT: 31 % — ABNORMAL LOW (ref 39.0–52.0)
Hemoglobin: 10.5 g/dL — ABNORMAL LOW (ref 13.0–17.0)
O2 Saturation: 96 %
Patient temperature: 36.8
Potassium: 3.8 mmol/L (ref 3.5–5.1)
Sodium: 128 mmol/L — ABNORMAL LOW (ref 135–145)
TCO2: 27 mmol/L (ref 22–32)
pCO2 arterial: 34 mmHg (ref 32.0–48.0)
pH, Arterial: 7.496 — ABNORMAL HIGH (ref 7.350–7.450)
pO2, Arterial: 76 mmHg — ABNORMAL LOW (ref 83.0–108.0)

## 2021-09-15 LAB — CBC
HCT: 27.6 % — ABNORMAL LOW (ref 39.0–52.0)
Hemoglobin: 9.6 g/dL — ABNORMAL LOW (ref 13.0–17.0)
MCH: 30.3 pg (ref 26.0–34.0)
MCHC: 34.8 g/dL (ref 30.0–36.0)
MCV: 87.1 fL (ref 80.0–100.0)
Platelets: 128 10*3/uL — ABNORMAL LOW (ref 150–400)
RBC: 3.17 MIL/uL — ABNORMAL LOW (ref 4.22–5.81)
RDW: 16.3 % — ABNORMAL HIGH (ref 11.5–15.5)
WBC: 6.7 10*3/uL (ref 4.0–10.5)
nRBC: 0 % (ref 0.0–0.2)

## 2021-09-15 LAB — T3, FREE: T3, Free: 1.7 pg/mL — ABNORMAL LOW (ref 2.0–4.4)

## 2021-09-15 LAB — DRVVT MIX: dRVVT Mix: 45.4 s — ABNORMAL HIGH (ref 0.0–40.4)

## 2021-09-15 LAB — COOXEMETRY PANEL
Carboxyhemoglobin: 2.2 % — ABNORMAL HIGH (ref 0.5–1.5)
Carboxyhemoglobin: 2.2 % — ABNORMAL HIGH (ref 0.5–1.5)
Methemoglobin: 0.6 % (ref 0.0–1.5)
Methemoglobin: 0.9 % (ref 0.0–1.5)
O2 Saturation: 59.1 %
O2 Saturation: 61.4 %
Total hemoglobin: 12.6 g/dL (ref 12.0–16.0)
Total hemoglobin: 9.7 g/dL — ABNORMAL LOW (ref 12.0–16.0)

## 2021-09-15 LAB — CULTURE, BLOOD (ROUTINE X 2)
Culture: NO GROWTH
Culture: NO GROWTH
Special Requests: ADEQUATE
Special Requests: ADEQUATE

## 2021-09-15 LAB — LACTATE DEHYDROGENASE: LDH: 410 U/L — ABNORMAL HIGH (ref 98–192)

## 2021-09-15 LAB — LUPUS ANTICOAGULANT PANEL
DRVVT: 65.8 s — ABNORMAL HIGH (ref 0.0–47.0)
PTT Lupus Anticoagulant: 50.9 s (ref 0.0–51.9)

## 2021-09-15 LAB — HEPARIN LEVEL (UNFRACTIONATED)
Heparin Unfractionated: <0.1 IU/mL — ABNORMAL LOW (ref 0.30–0.70)
Heparin Unfractionated: <0.1 IU/mL — ABNORMAL LOW (ref 0.30–0.70)

## 2021-09-15 LAB — MAGNESIUM: Magnesium: 1.9 mg/dL (ref 1.7–2.4)

## 2021-09-15 LAB — DRVVT CONFIRM: dRVVT Confirm: 0.9 ratio (ref 0.8–1.2)

## 2021-09-15 MED ORDER — POTASSIUM CHLORIDE CRYS ER 20 MEQ PO TBCR
40.0000 meq | EXTENDED_RELEASE_TABLET | Freq: Two times a day (BID) | ORAL | Status: AC
  Administered 2021-09-15 (×2): 40 meq via ORAL
  Filled 2021-09-15: qty 2

## 2021-09-15 MED ORDER — FUROSEMIDE 10 MG/ML IJ SOLN: 80.0000 mg | Freq: Two times a day (BID) | INTRAMUSCULAR | Status: DC

## 2021-09-15 MED ORDER — NOREPINEPHRINE 4 MG/250ML-% IV SOLN
0.0000 ug/min | INTRAVENOUS | Status: DC
  Administered 2021-09-15: 06:00:00 1 ug/min via INTRAVENOUS
  Filled 2021-09-15: qty 250

## 2021-09-15 MED ORDER — TOLVAPTAN 15 MG PO TABS
15.0000 mg | ORAL_TABLET | Freq: Once | ORAL | Status: AC
  Administered 2021-09-15: 10:00:00 15 mg via ORAL
  Filled 2021-09-15: qty 1

## 2021-09-15 MED ORDER — POTASSIUM CHLORIDE CRYS ER 20 MEQ PO TBCR
40.0000 meq | EXTENDED_RELEASE_TABLET | Freq: Once | ORAL | Status: DC
  Filled 2021-09-15: qty 2

## 2021-09-15 MED ORDER — FUROSEMIDE 10 MG/ML IJ SOLN
80.0000 mg | Freq: Two times a day (BID) | INTRAMUSCULAR | Status: AC
  Administered 2021-09-15 (×2): 80 mg via INTRAVENOUS
  Filled 2021-09-15 (×2): qty 8

## 2021-09-15 MED ORDER — MAGNESIUM SULFATE 2 GM/50ML IV SOLN
2.0000 g | Freq: Once | INTRAVENOUS | Status: AC
  Administered 2021-09-15: 10:00:00 2 g via INTRAVENOUS
  Filled 2021-09-15: qty 50

## 2021-09-15 MED ORDER — HEPARIN (PORCINE) 25000 UT/250ML-% IV SOLN
500.0000 [IU]/h | INTRAVENOUS | Status: DC
  Administered 2021-09-15: 13:00:00 200 [IU]/h via INTRAVENOUS
  Filled 2021-09-15: qty 250

## 2021-09-15 NOTE — Progress Notes (Addendum)
ANTICOAGULATION CONSULT NOTE   Pharmacy Consult for Heparin Indication: atrial fibrillation, impella 5.5  Allergies  Allergen Reactions   Mushroom Extract Complex Nausea And Vomiting   Neosporin [Neomycin-Bacitracin Zn-Polymyx] Hives    Patient Measurements: Height: 5\' 9"  (175.3 cm) Weight: 80.3 kg (177 lb 0.5 oz) IBW/kg (Calculated) : 70.7 Heparin Dosing Weight: 81.6 kg  Vital Signs: Temp: 98.1 F (36.7 C) (12/26 0800) Temp Source: Core (12/26 0400) Pulse Rate: 78 (12/26 0800)  Labs: Recent Labs    09/13/21 0517 09/13/21 1656 09/14/21 0256 09/14/21 0420 09/15/21 0408 09/15/21 0426  HGB 10.8*  --    < > 10.7* 9.6* 10.5*  HCT 30.2*  --    < > 30.8* 27.6* 31.0*  PLT 165  --   --  158 128*  --   HEPARINUNFRC 0.12* <0.10*  --  <0.10* <0.10*  --   CREATININE 1.98*  --   --  1.70* 1.49*  --    < > = values in this interval not displayed.     Estimated Creatinine Clearance: 46.8 mL/min (A) (by C-G formula based on SCr of 1.49 mg/dL (H)).   Medical History: Past Medical History:  Diagnosis Date   Arrhythmia    atrial fibrillation   CHF (congestive heart failure) (HCC)    Chronic kidney disease    Coronary artery disease    Hyperlipidemia    Hypertension    Myocardial infarct (HCC)     Medications:  Medications Prior to Admission  Medication Sig Dispense Refill Last Dose   albuterol (VENTOLIN HFA) 108 (90 Base) MCG/ACT inhaler Inhale 2 puffs into the lungs every 6 (six) hours as needed.   unknown   apixaban (ELIQUIS) 5 MG TABS tablet Take 1 tablet (5 mg total) by mouth 2 (two) times daily. 60 tablet 2 09/06/2021 at 0500   furosemide (LASIX) 20 MG tablet Take 1 tablet (20 mg total) by mouth daily as needed for edema or fluid (sob). (Patient taking differently: Take 20 mg by mouth daily.) 90 tablet 0 Past Week   midodrine (PROAMATINE) 2.5 MG tablet Take 1 tablet (2.5 mg total) by mouth 3 (three) times daily with meals. 90 tablet 2 09/06/2021   amiodarone  (PACERONE) 200 MG tablet Take 1 tablet (200 mg total) by mouth 2 (two) times daily. (Patient not taking: Reported on 08/29/2021) 60 tablet 2 Not Taking   metoprolol tartrate (LOPRESSOR) 25 MG tablet Take 0.5 tablets (12.5 mg total) by mouth 2 (two) times daily. (Patient not taking: Reported on 08/29/2021) 30 tablet 2 Not Taking   montelukast (SINGULAIR) 10 MG tablet Take by mouth. (Patient not taking: Reported on 09/06/2021)   Not Taking   spironolactone (ALDACTONE) 25 MG tablet Take 0.5 tablets (12.5 mg total) by mouth daily. (Patient not taking: Reported on 08/29/2021) 15 tablet 2 Not Taking   tamsulosin (FLOMAX) 0.4 MG CAPS capsule TAKE 1 CAPSULE (0.4 MG TOTAL) BY MOUTH DAILY AFTER BREAKFAST. (Patient not taking: Reported on 08/29/2021) 30 capsule 0 Not Taking    Scheduled:   Chlorhexidine Gluconate Cloth  6 each Topical Daily   feeding supplement  237 mL Oral TID BM   montelukast  5 mg Oral QHS   multivitamin with minerals  1 tablet Oral Daily   rosuvastatin  10 mg Oral Daily   senna-docusate  2 tablet Oral QHS   sodium chloride flush  10-40 mL Intracatheter Q12H   sodium chloride flush  3 mL Intravenous Q12H   sodium chloride flush  3  mL Intravenous Q12H    Assessment: 86 yom with a history of stage of systolic CHF chronic LVEF less than 20%, status post AICD/PPM, PAF on Eliquis, CKD stage IIIb, HTN, HLD. Patient presented with weakness, near syncope, and poor oral intake. Heparin per pharmacy consult placed for atrial fibrillation in preparation for RHC > impella and LVAD work up  [place] placed 12/23. Systemic heparin was stopped for implant and not resumed after with oozing at insertion site and slight hematuria - now resolved. With up trending LDH, cardiology would like systemic heparin started this morning. Patient had low systemic requirements prior to impella so will follow rate adjustments closely.   Heparin purge (50 units/mL concentration) Purge flow 8.60ml/hr = 415 units/hr of  heparin; purge pressures stable 500s. Heparin level < 0.1 less than goal, will start systemic as noted above.   Hgb stable 10s, pltc trending down 128, LDH up post insertion 410 -watch.  Goal of Therapy:  Heparin level 0.3-0.5 units/ml Monitor platelets by anticoagulation protocol: Yes   Plan:  Start systemic heparin at 200 units/hr -titrate rate slowly to achieve goal Continue heparin purge per pump Heparin level daily  Monitor for s/sx of bleeding,  Erin Hearing PharmD., BCPS Clinical Pharmacist 09/15/2021 8:24 AM

## 2021-09-15 NOTE — Progress Notes (Signed)
Pharmacy Antibiotic Note  Isaac [name] is a 70 y.o. male admitted on 09/06/2021 with  weakness and syncope . Patien with fevers last week. Did go for Impella pump 12/23. He continues on vancomycin and cefepime.   No fevers overnight, wbc normal. Scr improved at at 1.49.   Recheck trough today even though its early.   Vanc 12/21> Cefepime 12/21>  12/21 bld: ng 12/21 urine: multiple 12/23 urine: ng MRSA pcr neg  Plan: Cefepime 2g iv q12 hours Vancomycin 1g q24 hours - check trough level today  Height: 5\' 9"  (175.3 cm) Weight: 80.3 kg (177 lb 0.5 oz) IBW/kg (Calculated) : 70.7  Temp (24hrs), Avg:98 F (36.7 C), Min:97.5 F (36.4 C), Max:98.6 F (37 C)  Recent Labs  Lab 09/12/21 0240 09/12/21 0909 09/12/21 1628 09/13/21 0020 09/13/21 0517 09/13/21 0646 09/14/21 0420 09/15/21 0408  WBC 6.7  --  4.4  --  5.7  --  7.0 6.7  CREATININE  --    < > 2.17* 1.85* 1.98*  --  1.70* 1.49*  VANCORANDOM  --   --   --   --   --  14  --   --    < > = values in this interval not displayed.     Estimated Creatinine Clearance: 46.8 mL/min (A) (by C-G formula based on SCr of 1.49 mg/dL (H)).    Allergies  Allergen Reactions   Mushroom Extract Complex Nausea And Vomiting   Neosporin [Neomycin-Bacitracin Zn-Polymyx] Hives   Thank you for allowing pharmacy to be a part of this patients care.  Erin Hearing PharmD., BCPS Clinical Pharmacist 09/15/2021 8:25 AM

## 2021-09-15 NOTE — Progress Notes (Signed)
Patient ID: Isaac [name], male   DOB: 09/30/1950, 70 y.o.   MRN: [id number]     Advanced Heart Failure Rounding Note  PCP-Cardiologist: None   Subjective:    12/19 RHC- RA 7, PA 39/14 (25), PCWP 11, CO 6.4 CI 3. Thermo 3.5 1.8. Lasix drip stopped after cath. 12/20 Swan removed.  Norepi 3 mcg added.  12/21 Fever. Blood CX drawn. UA + leukocytes. Started on vanc and cefepime. Diuresed with IV lasix + metolazone.  12/23 Impella 5.5 placed  Impella 5.5 placed 12/23.  Heparin only in purge. No further bleeding at Impella site.   Remains on milrinone 0.5. NE off. Impella decreased P9=>P8 due to suction alarms, no further alarms at P8. I/Os normal, no Lasix yesterday.   He remains on vancomycin/cefepime, ?from phlebitis.   Impella: P8  Flow 4.0 L/min Position stable on echo yesterday.   SWAN CVP 16 PA 45/22 PCWP - unable to wedge CI 2.3  Creatinine 2.14 > 2.40 > 2.24 > 2.38 > 1.85 > 1.98 > 1.70 > 1.49  LDH  272 -> 383 -> 410  No complaints, remains afebrile.  Was up to chair yesterday.   Objective:   Weight Range: 80.3 kg Body mass index is 26.14 kg/m.   Vital Signs:   Temp:  [97.5 F (36.4 C)-98.6 F (37 C)] 98.2 F (36.8 C) (12/26 0813) Pulse Rate:  [63-109] 80 (12/26 0813) Resp:  [11-20] 14 (12/26 0813) SpO2:  [94 %-99 %] 95 % (12/26 0813) Arterial Line BP: (86-124)/(57-96) 99/65 (12/26 0813) Weight:  [80.3 kg] 80.3 kg (12/26 0600) Last BM Date: 09/10/21  Weight change: Filed Weights   09/13/21 0600 09/14/21 0500 09/15/21 0600  Weight: 79 kg 81.6 kg 80.3 kg    Intake/Output:   Intake/Output Summary (Last 24 hours) at 09/15/2021 0903 Last data filed at 09/15/2021 0800 Gross per 24 hour  Intake 2182.29 ml  Output 1960 ml  Net 222.29 ml      Physical Exam   General: NAD. Impella 5.5 right upper chest wall.  Neck: JVP 14 cm, no thyromegaly or thyroid nodule.  Lungs: Mild crackles at bases.  CV: Nondisplaced PMI.  Heart irregular S1/S2, no  S3/S4, no murmur.  Trace ankle edema.  Abdomen: Soft, nontender, no hepatosplenomegaly, no distention.  Skin: Intact without lesions or rashes.  Neurologic: Alert and oriented x 3.  Psych: Normal affect. Extremities: No clubbing or cyanosis. Hematoma left forearm.  HEENT: Normal.   Telemetry   Atrial fibrillation 80-90 Personally reviewed  Labs    CBC Recent Labs    09/14/21 0420 09/15/21 0408 09/15/21 0426  WBC 7.0 6.7  --   HGB 10.7* 9.6* 10.5*  HCT 30.8* 27.6* 31.0*  MCV 86.5 87.1  --   PLT 158 128*  --    Basic Metabolic Panel  Recent Labs    09/14/21 0420 09/15/21 0408 09/15/21 0426  NA 126* 126* 128*  K 3.9 3.7 3.8  CL 92* 93*  --   CO2 26 25  --   GLUCOSE 115* 105*  --   BUN 37* 34*  --   CREATININE 1.70* 1.49*  --   CALCIUM 8.9 8.5*  --   MG 2.1 1.9  --    Liver Function Tests No results for input(s): AST, ALT, ALKPHOS, BILITOT, PROT, ALBUMIN in the last 72 hours.  No results for input(s): LIPASE, AMYLASE in the last 72 hours. Cardiac Enzymes No results for input(s): CKTOTAL, CKMB, CKMBINDEX, TROPONINI in the last  72 hours.  BNP: BNP (last 3 results) Recent Labs    08/22/21 0510 09/04/21 1443 09/06/21 1243  BNP 1,593.4* 1,644.2* 1,614.8*    ProBNP (last 3 results) No results for input(s): PROBNP in the last 8760 hours.   D-Dimer No results for input(s): DDIMER in the last 72 hours. Hemoglobin A1C No results for input(s): HGBA1C in the last 72 hours. Fasting Lipid Panel No results for input(s): CHOL, HDL, LDLCALC, TRIG, CHOLHDL, LDLDIRECT in the last 72 hours.  Thyroid Function Tests Recent Labs    09/13/21 0517  T3FREE 1.7*    Other results:   Imaging    No results found.   Medications:     Scheduled Medications:  Chlorhexidine Gluconate Cloth  6 each Topical Daily   feeding supplement  237 mL Oral TID BM   furosemide  80 mg Intravenous BID   montelukast  5 mg Oral QHS   multivitamin with minerals  1 tablet Oral  Daily   potassium chloride  40 mEq Oral BID   rosuvastatin  10 mg Oral Daily   senna-docusate  2 tablet Oral QHS   sodium chloride flush  10-40 mL Intracatheter Q12H   sodium chloride flush  3 mL Intravenous Q12H   sodium chloride flush  3 mL Intravenous Q12H   tolvaptan  15 mg Oral Once    Infusions:  sodium chloride Stopped (09/10/21 1119)   amiodarone 30 mg/hr (09/15/21 0800)   ceFEPime (MAXIPIME) IV Stopped (09/14/21 2145)   heparin 50 units/mL (Impella PURGE) in dextrose 5 % 1000 mL bag     heparin     lactated ringers 20 mL/hr at 09/15/21 0800   magnesium sulfate bolus IVPB     milrinone 0.5 mcg/kg/min (09/15/21 0800)   norepinephrine (LEVOPHED) Adult infusion Stopped (09/15/21 0653)    PRN Medications: sodium chloride, sodium chloride, acetaminophen, albuterol, lactated ringers, morphine injection, ondansetron (ZOFRAN) IV, oxyCODONE, sodium chloride flush, sodium chloride flush, Thrombi-Pad    Assessment/Plan   1. Acute on chronic systolic CHF:  Long-standing cardiomyopathy.  [place].  Echo this admission with EF < 20%, severe LV dilation, restrictive diastolic function, moderate RV dysfunction, moderate MR, mod-severe TR. Cause of cardiomyopathy is uncertain.  He has a history of CAD, but I do not think that the described CAD from the past could explain his cardiomyopathy, but CAD could have progressed.  With difficulty tolerating GDMT/need for midodrine and cardiorenal syndrome as well as profound volume overload,  concerned for low output HF.  Co-ox off milrinone was 36%, milrinone started and increased to 0.375 but CO remained low. NE added and Impella 5.5 placed 12/23. EF 10% on TEE 12/23. Co-ox 59% with CI 2.3 on milrinone 0.5, Impella P-8.  No Impella alarms at P8, good position by echo yesterday.  CVP up to 16, no Lasix yesterday and Na 126.  Creatinine down to 1.49.  - Lasix 80 mg IV bid today, replace K.  Will give a dose of tolvaptan 15 mg x 1.  -  Continue Impella at P8, no alarms.  With rising LDH and falling plts, will start heparin gtt for Impella (only in purge currently).  No further bleeding at site.  - MAP from Impella matches MAP from arterial line.  With left forearm hematoma (may be from arterial line), will remove arterial line before starting heparin gtt.  - Creatinine improved, Dr. Cyndia Bent will need to see Tuesday for LVAD discussion.  2. Atrial fibrillation: Persistent since 10/22 based on device  interrogation.  Remains on amio for rate control.  - Continue amio.  - Can consider DC-CV at time of VAD - Heparin gtt to be restarted today. 3. AKI on CKD stage 3: Creatinine 1.99 => 2.14=>2.4 -> 1.85 -> 1.98 -> 1.70 -> 1.49.  - Continue hemodynamic support 4. Syncope: Prior to admission, stood up then passed out.  ?Orthostatic versus arrhythmic.  He had VT run terminated by ATP that may have been around the time of syncope.   - Now on amiodarone gtt.  5. Elevated TSH/elevated free T4: Mixed picture, ?sick euthyroid.   6. CAD: History of PCI to OM1 in 2007 and RCA in 2013.  No chest pain or ACS.  - Deferring cardiac cath due to AKI and plan for VAD - Continue Crestor.  7. Malnutrition: Prealbumin 7.8.  He is eating well.  -Dietitian following 8. ID: Had fever 12/21, PCT 1.03.  Has been afebrile since with normal WBCs.  Blood cultures negative.  ?Phlebitis at IV site.  - Has had vancomycin for 5 days, can stop.  - Continue cefepime for now.  9. L Pleural Effusion: Noted on CT.  10. Gout: Uric Acid 13.3. No evidence of gout flare 11. Elevated PSA: PSA 7.2  12. Hypokalemia - Supplement.  13. Hyponatremia: Na 126.  - Restrict FW - With need for diuresis, will give tolvaptan 15 mg x 1.   Mobilize.   CRITICAL CARE Performed by: Loralie Champagne  Total critical care time: 40 minutes  Critical care time was exclusive of separately billable procedures and treating other patients.  Critical care was necessary to treat or prevent  imminent or life-threatening deterioration.  Critical care was time spent personally by me (independent of midlevel providers or residents) on the following activities: development of treatment plan with patient and/or surrogate as well as nursing, discussions with consultants, evaluation of patient's response to treatment, examination of patient, obtaining history from patient or surrogate, ordering and performing treatments and interventions, ordering and review of laboratory studies, ordering and review of radiographic studies, pulse oximetry and re-evaluation of patient's condition.    Length of Stay: [place], MD  09/15/2021, 9:03 AM  Advanced Heart Failure Team Pager [phone] (M-F; 7a - 5p)  Please contact [facility] for night-coverage after hours (5p -7a ) and weekends on [email or web address]

## 2021-09-15 NOTE — Progress Notes (Signed)
Physical Therapy Treatment Patient Details Name: Isaac [name] MRN: [id number] DOB: October 29, 1950 Today's Date: 09/15/2021   History of Present Illness Pt is a 70 y.o. male admitted 09/07/21 with near-syncope, nausea, poor oral intake, generalized weakness. Workup for advanced heart failure and persistent inotropic with requirement, cardiogenic shock; potential destination LVAD. [place] 12/19. Pt scheduled for Impella 12/21, cancelled due to fever, now planned for 12/23. PMH includes advanced CHF (LVEF <20%), AICD/PPM, PAF on Eliquis, CKD 3, HTN, HLD.    PT Comments    The pt was seen after placement of R axillary impella. He continues to demo great strength and power in BLE to complete sit-stand transfers without assist or need for DME. He was able to complete repeated sit-stand transfers without use of UE and demos good static standing stability without need for assist or UE support. Further gait progression limited by significant lines and pain at impella site. Suspect the pt will continue to make good progress and would be able to return home once medically stable to d/c.     Recommendations for follow up therapy are one component of a multi-disciplinary discharge planning process, led by the attending physician.  Recommendations may be updated based on patient status, additional functional criteria and insurance authorization.  Follow Up Recommendations  Home health PT (vs phase II cardiac rehab)     Assistance Recommended at Discharge PRN  Equipment Recommendations  Rollator (4 wheels)    Recommendations for Other Services       Precautions / Restrictions Precautions Precautions: Fall;Other (comment) Precaution Comments: impella R axillary, swan [place] R IJ Restrictions Weight Bearing Restrictions: Yes RUE Weight Bearing: Non weight bearing     Mobility  Bed Mobility Overal bed mobility: Needs Assistance Bed Mobility: Supine to Sit     Supine to sit: Supervision      General bed mobility comments: supervision for line/impella management    Transfers Overall transfer level: Needs assistance Equipment used: None Transfers: Sit to/from Stand Sit to Stand: Supervision           General transfer comment: supervision for line management, no assist given    Ambulation/Gait Ambulation/Gait assistance: Min guard Gait Distance (Feet): 3 Feet Assistive device: None Gait Pattern/deviations: Step-through pattern;Decreased stride length;Staggering right     Pre-gait activities: repeated sit-stand from EOB General Gait Details: small lateral steps to recliner from EOB with assist only for line management      Balance Overall balance assessment: Needs assistance Sitting-balance support: No upper extremity supported;Feet supported Sitting balance-Leahy Scale: Good     Standing balance support: No upper extremity supported;During functional activity Standing balance-Leahy Scale: Good Standing balance comment: no UE support for small steps but pt with hands in high guard and taking small tentative steps                            Cognition Arousal/Alertness: Awake/alert Behavior During Therapy: WFL for tasks assessed/performed Overall Cognitive Status: Within Functional Limits for tasks assessed                                 General Comments: slightly flat affect with good ability to follow commands and good line awareness        Exercises Other Exercises Other Exercises: x3 sit-stand from EOB without UE support    General Comments General comments (skin integrity, edema, etc.): VSS on RA  Pertinent Vitals/Pain Pain Assessment: 0-10 Pain Score: 10-Worst pain ever Pain Location: R armpit, impella Pain Descriptors / Indicators: Discomfort;Grimacing Pain Intervention(s): Limited activity within patient's tolerance;Monitored during session;Repositioned     PT Goals (current goals can now be found in the  care plan section) Acute Rehab PT Goals Patient Stated Goal: Home soon PT Goal Formulation: With patient Time For Goal Achievement: 09/21/21 Potential to Achieve Goals: Good Progress towards PT goals: Progressing toward goals    Frequency    Min 3X/week      PT Plan Current plan remains appropriate       AM-PAC PT "6 Clicks" Mobility   Outcome Measure  Help needed turning from your back to your side while in a flat bed without using bedrails?: None Help needed moving from lying on your back to sitting on the side of a flat bed without using bedrails?: None Help needed moving to and from a bed to a chair (including a wheelchair)?: None Help needed standing up from a chair using your arms (e.g., wheelchair or bedside chair)?: None Help needed to walk in hospital room?: A Little Help needed climbing 3-5 steps with a railing? : A Little 6 Click Score: 22    End of Session Equipment Utilized During Treatment: Gait belt Activity Tolerance: Patient tolerated treatment well Patient left: in chair;with call bell/phone within reach;with nursing/sitter in room;with family/visitor present Nurse Communication: Mobility status PT Visit Diagnosis: Unsteadiness on feet (R26.81);Other abnormalities of gait and mobility (R26.89)     Time: 1349-1406 PT Time Calculation (min) (ACUTE ONLY): 17 min  Charges:  $Therapeutic Activity: 8-22 mins                     [place], PT, DPT   Acute Rehabilitation Department Pager #: [phone]   Sandra Cockayne 09/15/2021, 3:36 PM

## 2021-09-15 NOTE — Progress Notes (Signed)
0900- Hematoma noted to pt left wrist and inner forearm. Dr. Aundra Dubin at bedside. Arm is warm and dry, soft to palpation, does not cause pt pain. MD stated to d/c aline, elevate extremity and to hold start of heparin gtt at this time.  0915- Aline d/c, pressure held for 10 minutes, hematoma at left wrist decreased, bruising still noted. Pressure dressing applied after bleeding stopped. Hematoma still noted to left inner forearm. Extremity elevated. Pt educated on potential bleeding risk, call bell in reach and pt knows to let staff know if further bleeding noted.

## 2021-09-15 NOTE — Progress Notes (Signed)
ANTICOAGULATION CONSULT NOTE   Pharmacy Consult for Heparin Indication: atrial fibrillation, impella 5.5  Allergies  Allergen Reactions   Mushroom Extract Complex Nausea And Vomiting   Neosporin [Neomycin-Bacitracin Zn-Polymyx] Hives    Patient Measurements: Height: 5\' 9"  (175.3 cm) Weight: 80.3 kg (177 lb 0.5 oz) IBW/kg (Calculated) : 70.7 Heparin Dosing Weight: 81.6 kg  Vital Signs: Temp: 98.1 F (36.7 C) (12/26 1900) Temp Source: Core (12/26 1600) BP: 115/94 (12/26 1900) Pulse Rate: 83 (12/26 1900)  Labs: Recent Labs    09/13/21 0517 09/13/21 1656 09/14/21 0420 09/15/21 0408 09/15/21 0426 09/15/21 2057  HGB 10.8*   < > 10.7* 9.6* 10.5*  --   HCT 30.2*   < > 30.8* 27.6* 31.0*  --   PLT 165  --  158 128*  --   --   HEPARINUNFRC 0.12*   < > <0.10* <0.10*  --  <0.10*  CREATININE 1.98*  --  1.70* 1.49*  --   --    < > = values in this interval not displayed.    Estimated Creatinine Clearance: 46.8 mL/min (A) (by C-G formula based on SCr of 1.49 mg/dL (H)).   Medical History: Past Medical History:  Diagnosis Date   Arrhythmia    atrial fibrillation   CHF (congestive heart failure) (HCC)    Chronic kidney disease    Coronary artery disease    Hyperlipidemia    Hypertension    Myocardial infarct (HCC)     Medications:  Medications Prior to Admission  Medication Sig Dispense Refill Last Dose   albuterol (VENTOLIN HFA) 108 (90 Base) MCG/ACT inhaler Inhale 2 puffs into the lungs every 6 (six) hours as needed.   unknown   apixaban (ELIQUIS) 5 MG TABS tablet Take 1 tablet (5 mg total) by mouth 2 (two) times daily. 60 tablet 2 09/06/2021 at 0500   furosemide (LASIX) 20 MG tablet Take 1 tablet (20 mg total) by mouth daily as needed for edema or fluid (sob). (Patient taking differently: Take 20 mg by mouth daily.) 90 tablet 0 Past Week   midodrine (PROAMATINE) 2.5 MG tablet Take 1 tablet (2.5 mg total) by mouth 3 (three) times daily with meals. 90 tablet 2 09/06/2021    amiodarone (PACERONE) 200 MG tablet Take 1 tablet (200 mg total) by mouth 2 (two) times daily. (Patient not taking: Reported on 08/29/2021) 60 tablet 2 Not Taking   metoprolol tartrate (LOPRESSOR) 25 MG tablet Take 0.5 tablets (12.5 mg total) by mouth 2 (two) times daily. (Patient not taking: Reported on 08/29/2021) 30 tablet 2 Not Taking   montelukast (SINGULAIR) 10 MG tablet Take by mouth. (Patient not taking: Reported on 09/06/2021)   Not Taking   spironolactone (ALDACTONE) 25 MG tablet Take 0.5 tablets (12.5 mg total) by mouth daily. (Patient not taking: Reported on 08/29/2021) 15 tablet 2 Not Taking   tamsulosin (FLOMAX) 0.4 MG CAPS capsule TAKE 1 CAPSULE (0.4 MG TOTAL) BY MOUTH DAILY AFTER BREAKFAST. (Patient not taking: Reported on 08/29/2021) 30 capsule 0 Not Taking    Scheduled:   Chlorhexidine Gluconate Cloth  6 each Topical Daily   feeding supplement  237 mL Oral TID BM   montelukast  5 mg Oral QHS   multivitamin with minerals  1 tablet Oral Daily   rosuvastatin  10 mg Oral Daily   senna-docusate  2 tablet Oral QHS   sodium chloride flush  10-40 mL Intracatheter Q12H   sodium chloride flush  3 mL Intravenous Q12H   sodium  chloride flush  3 mL Intravenous Q12H    Assessment: 51 yom with a history of stage of systolic CHF chronic LVEF less than 20%, status post AICD/PPM, PAF on Eliquis, CKD stage IIIb, HTN, HLD. Patient presented with weakness, near syncope, and poor oral intake. Heparin per pharmacy consult placed for atrial fibrillation in preparation for RHC > impella and LVAD work up  [place] placed 12/23. Systemic heparin was stopped for implant and not resumed after with oozing at insertion site and slight hematuria - now resolved. With up trending LDH, cardiology would like systemic heparin started this morning. Patient had low systemic requirements prior to impella so will follow rate adjustments closely.   Heparin purge (50 units/mL concentration) Purge flow 8.94ml/hr = 415  units/hr of heparin; purge pressures stable 500s. Heparin level < 0.1 less than goal, will start systemic as noted above.  Hgb stable 10s, pltc trending down 128, LDH up post insertion 410 -watch.  PM Update: Heparin level remains <0.1 with current rate of systemic heparin at 200 units/hr and purge flow rate up some at 9 ml/hr (450 units/hr) for total of 650 units/hr. RN states minor oozing at impella 5.5 site but does not look bad and prior infiltration site is resolved.   Goal of Therapy:  Heparin level 0.3-0.5 units/ml Monitor platelets by anticoagulation protocol: Yes   Plan:  Increase systemic heparin at 300 units/hr -titrate rate slowly to achieve goal Continue heparin purge per pump Heparin level in ~6 hours Monitor for s/sx of bleeding,  Erin Hearing PharmD., BCPS Clinical Pharmacist 09/15/2021 9:52 PM

## 2021-09-16 ENCOUNTER — Inpatient Hospital Stay (HOSPITAL_COMMUNITY): Payer: Medicare HMO

## 2021-09-16 DIAGNOSIS — I10 Essential (primary) hypertension: Secondary | ICD-10-CM

## 2021-09-16 DIAGNOSIS — Z515 Encounter for palliative care: Secondary | ICD-10-CM | POA: Diagnosis not present

## 2021-09-16 DIAGNOSIS — Z7189 Other specified counseling: Secondary | ICD-10-CM | POA: Diagnosis not present

## 2021-09-16 DIAGNOSIS — I5023 Acute on chronic systolic (congestive) heart failure: Secondary | ICD-10-CM

## 2021-09-16 DIAGNOSIS — I5043 Acute on chronic combined systolic (congestive) and diastolic (congestive) heart failure: Secondary | ICD-10-CM | POA: Diagnosis not present

## 2021-09-16 DIAGNOSIS — I501 Left ventricular failure: Secondary | ICD-10-CM | POA: Diagnosis not present

## 2021-09-16 DIAGNOSIS — N1832 Chronic kidney disease, stage 3b: Secondary | ICD-10-CM

## 2021-09-16 DIAGNOSIS — I4891 Unspecified atrial fibrillation: Secondary | ICD-10-CM

## 2021-09-16 DIAGNOSIS — Z95811 Presence of heart assist device: Secondary | ICD-10-CM | POA: Diagnosis not present

## 2021-09-16 DIAGNOSIS — I2510 Atherosclerotic heart disease of native coronary artery without angina pectoris: Secondary | ICD-10-CM

## 2021-09-16 LAB — BASIC METABOLIC PANEL
Anion gap: 8 (ref 5–15)
Anion gap: 9 (ref 5–15)
BUN: 35 mg/dL — ABNORMAL HIGH (ref 8–23)
BUN: 32 mg/dL — ABNORMAL HIGH (ref 8–23)
CO2: 29 mmol/L (ref 22–32)
CO2: 28 mmol/L (ref 22–32)
Calcium: 8.7 mg/dL — ABNORMAL LOW (ref 8.9–10.3)
Calcium: 8.6 mg/dL — ABNORMAL LOW (ref 8.9–10.3)
Chloride: 96 mmol/L — ABNORMAL LOW (ref 98–111)
Chloride: 96 mmol/L — ABNORMAL LOW (ref 98–111)
Creatinine, Ser: 1.71 mg/dL — ABNORMAL HIGH (ref 0.61–1.24)
Creatinine, Ser: 1.62 mg/dL — ABNORMAL HIGH (ref 0.61–1.24)
GFR, Estimated: 43 mL/min — ABNORMAL LOW (ref >60)
GFR, Estimated: 46 mL/min — ABNORMAL LOW (ref >60)
Glucose, Bld: 110 mg/dL — ABNORMAL HIGH (ref 70–99)
Glucose, Bld: 116 mg/dL — ABNORMAL HIGH (ref 70–99)
Potassium: 4.1 mmol/L (ref 3.5–5.1)
Potassium: 3.7 mmol/L (ref 3.5–5.1)
Sodium: 133 mmol/L — ABNORMAL LOW (ref 135–145)
Sodium: 133 mmol/L — ABNORMAL LOW (ref 135–145)

## 2021-09-16 LAB — COOXEMETRY PANEL
Carboxyhemoglobin: 2.4 % — ABNORMAL HIGH (ref 0.5–1.5)
Methemoglobin: 0.9 % (ref 0.0–1.5)
O2 Saturation: 65.2 %
Total hemoglobin: 10.2 g/dL — ABNORMAL LOW (ref 12.0–16.0)

## 2021-09-16 LAB — POCT I-STAT 7, (LYTES, BLD GAS, ICA,H+H)
Acid-Base Excess: 7 mmol/L — ABNORMAL HIGH (ref 0.0–2.0)
Bicarbonate: 30.5 mmol/L — ABNORMAL HIGH (ref 20.0–28.0)
Calcium, Ion: 1.26 mmol/L (ref 1.15–1.40)
HCT: 31 % — ABNORMAL LOW (ref 39.0–52.0)
Hemoglobin: 10.5 g/dL — ABNORMAL LOW (ref 13.0–17.0)
O2 Saturation: 94 %
Potassium: 4.2 mmol/L (ref 3.5–5.1)
Sodium: 135 mmol/L (ref 135–145)
TCO2: 32 mmol/L (ref 22–32)
pCO2 arterial: 40.2 mmHg (ref 32.0–48.0)
pH, Arterial: 7.488 — ABNORMAL HIGH (ref 7.350–7.450)
pO2, Arterial: 67 mmHg — ABNORMAL LOW (ref 83.0–108.0)

## 2021-09-16 LAB — LACTATE DEHYDROGENASE: LDH: 407 U/L — ABNORMAL HIGH (ref 98–192)

## 2021-09-16 LAB — CBC
HCT: 26.1 % — ABNORMAL LOW (ref 39.0–52.0)
Hemoglobin: 8.7 g/dL — ABNORMAL LOW (ref 13.0–17.0)
MCH: 29.7 pg (ref 26.0–34.0)
MCHC: 33.3 g/dL (ref 30.0–36.0)
MCV: 89.1 fL (ref 80.0–100.0)
Platelets: 149 10*3/uL — ABNORMAL LOW (ref 150–400)
RBC: 2.93 MIL/uL — ABNORMAL LOW (ref 4.22–5.81)
RDW: 16.5 % — ABNORMAL HIGH (ref 11.5–15.5)
WBC: 7.9 10*3/uL (ref 4.0–10.5)
nRBC: 0 % (ref 0.0–0.2)

## 2021-09-16 LAB — MRSA NEXT GEN BY PCR, NASAL: MRSA by PCR Next Gen: NOT DETECTED

## 2021-09-16 LAB — FACTOR 5 LEIDEN

## 2021-09-16 LAB — HEPARIN LEVEL (UNFRACTIONATED)
Heparin Unfractionated: <0.1 IU/mL — ABNORMAL LOW (ref 0.30–0.70)
Heparin Unfractionated: <0.1 IU/mL — ABNORMAL LOW (ref 0.30–0.70)

## 2021-09-16 LAB — PREPARE RBC (CROSSMATCH)

## 2021-09-16 LAB — ECHOCARDIOGRAM LIMITED
Height: 69 in
Weight: 2733.7 oz

## 2021-09-16 LAB — MAGNESIUM: Magnesium: 2.1 mg/dL (ref 1.7–2.4)

## 2021-09-16 MED ORDER — TRANEXAMIC ACID (OHS) PUMP PRIME SOLUTION
2.0000 mg/kg | INTRAVENOUS | Status: DC
  Filled 2021-09-16: qty 1.55

## 2021-09-16 MED ORDER — EPINEPHRINE HCL 5 MG/250ML IV SOLN IN NS
0.0000 ug/min | INTRAVENOUS | Status: AC
  Administered 2021-09-17: 2 ug/min via INTRAVENOUS
  Filled 2021-09-16: qty 250

## 2021-09-16 MED ORDER — CHLORHEXIDINE GLUCONATE CLOTH 2 % EX PADS
6.0000 | MEDICATED_PAD | Freq: Once | CUTANEOUS | Status: AC
  Administered 2021-09-17: 06:00:00 6 via TOPICAL

## 2021-09-16 MED ORDER — VANCOMYCIN HCL 1 G IV SOLR
1000.0000 mg | INTRAVENOUS | Status: DC
  Filled 2021-09-16: qty 20

## 2021-09-16 MED ORDER — BISACODYL 5 MG PO TBEC
5.0000 mg | DELAYED_RELEASE_TABLET | Freq: Once | ORAL | Status: AC
  Administered 2021-09-16: 20:00:00 5 mg via ORAL
  Filled 2021-09-16: qty 1

## 2021-09-16 MED ORDER — TRANEXAMIC ACID (OHS) BOLUS VIA INFUSION
15.0000 mg/kg | INTRAVENOUS | Status: AC
  Administered 2021-09-17: 09:00:00 1101 mg via INTRAVENOUS
  Filled 2021-09-16: qty 1101

## 2021-09-16 MED ORDER — DEXMEDETOMIDINE HCL IN NACL 400 MCG/100ML IV SOLN
0.1000 ug/kg/h | INTRAVENOUS | Status: AC
  Administered 2021-09-17: 10:00:00 .5 ug/kg/h via INTRAVENOUS
  Filled 2021-09-16: qty 100

## 2021-09-16 MED ORDER — INSULIN REGULAR(HUMAN) IN NACL 100-0.9 UT/100ML-% IV SOLN
INTRAVENOUS | Status: AC
  Administered 2021-09-17: 10:00:00 1.4 [IU]/h via INTRAVENOUS
  Filled 2021-09-16: qty 100

## 2021-09-16 MED ORDER — MUPIROCIN 2 % EX OINT
1.0000 "application " | TOPICAL_OINTMENT | Freq: Two times a day (BID) | CUTANEOUS | Status: DC
  Administered 2021-09-16: 1 via NASAL

## 2021-09-16 MED ORDER — CHLORHEXIDINE GLUCONATE 0.12 % MT SOLN
15.0000 mL | Freq: Once | OROMUCOSAL | Status: AC
  Administered 2021-09-17: 06:00:00 15 mL via OROMUCOSAL

## 2021-09-16 MED ORDER — SODIUM CHLORIDE 0.9 % IV SOLN
600.0000 mg | INTRAVENOUS | Status: AC
  Administered 2021-09-17: 09:00:00 600 mg via INTRAVENOUS
  Filled 2021-09-16: qty 600

## 2021-09-16 MED ORDER — TEMAZEPAM 15 MG PO CAPS: 15.0000 mg | ORAL_CAPSULE | Freq: Once | ORAL | Status: DC | PRN

## 2021-09-16 MED ORDER — HEPARIN 30,000 UNITS/1000 ML (OHS) CELLSAVER SOLUTION
Status: DC
  Filled 2021-09-16: qty 1000

## 2021-09-16 MED ORDER — PHENYLEPHRINE HCL-NACL 20-0.9 MG/250ML-% IV SOLN
0.0000 ug/min | INTRAVENOUS | Status: DC
  Filled 2021-09-16: qty 250

## 2021-09-16 MED ORDER — DOPAMINE-DEXTROSE 3.2-5 MG/ML-% IV SOLN
0.0000 ug/kg/min | INTRAVENOUS | Status: DC
  Filled 2021-09-16: qty 250

## 2021-09-16 MED ORDER — CEFAZOLIN SODIUM-DEXTROSE 2-4 GM/100ML-% IV SOLN
2.0000 g | INTRAVENOUS | Status: AC
  Administered 2021-09-17: 14:00:00 2 g via INTRAVENOUS
  Filled 2021-09-16: qty 100

## 2021-09-16 MED ORDER — FLUCONAZOLE IN SODIUM CHLORIDE 400-0.9 MG/200ML-% IV SOLN
400.0000 mg | INTRAVENOUS | Status: AC
  Administered 2021-09-17: 09:00:00 400 mg via INTRAVENOUS
  Filled 2021-09-16: qty 200

## 2021-09-16 MED ORDER — DOBUTAMINE IN D5W 4-5 MG/ML-% IV SOLN
2.0000 ug/kg/min | INTRAVENOUS | Status: DC
  Filled 2021-09-16: qty 250

## 2021-09-16 MED ORDER — MILRINONE LACTATE IN DEXTROSE 20-5 MG/100ML-% IV SOLN
0.3000 ug/kg/min | INTRAVENOUS | Status: AC
  Administered 2021-09-17: 09:00:00 .5 ug/kg/min via INTRAVENOUS
  Filled 2021-09-16: qty 100

## 2021-09-16 MED ORDER — FUROSEMIDE 10 MG/ML IJ SOLN
80.0000 mg | Freq: Once | INTRAMUSCULAR | Status: AC
  Administered 2021-09-16: 10:00:00 80 mg via INTRAVENOUS
  Filled 2021-09-16: qty 8

## 2021-09-16 MED ORDER — NITROGLYCERIN IN D5W 200-5 MCG/ML-% IV SOLN
0.0000 ug/min | INTRAVENOUS | Status: DC
  Filled 2021-09-16: qty 250

## 2021-09-16 MED ORDER — VANCOMYCIN HCL 1250 MG/250ML IV SOLN
1250.0000 mg | INTRAVENOUS | Status: AC
  Administered 2021-09-17: 09:00:00 1250 mg via INTRAVENOUS
  Filled 2021-09-16: qty 250

## 2021-09-16 MED ORDER — SORBITOL 70 % SOLN
30.0000 mL | Freq: Once | Status: AC
  Administered 2021-09-16: 10:00:00 30 mL via ORAL
  Filled 2021-09-16: qty 30

## 2021-09-16 MED ORDER — NOREPINEPHRINE 4 MG/250ML-% IV SOLN
0.0000 ug/min | INTRAVENOUS | Status: AC
  Administered 2021-09-17: 08:00:00 2 ug/min via INTRAVENOUS
  Filled 2021-09-16: qty 250

## 2021-09-16 MED ORDER — CHLORHEXIDINE GLUCONATE CLOTH 2 % EX PADS
6.0000 | MEDICATED_PAD | Freq: Once | CUTANEOUS | Status: AC
  Administered 2021-09-16: 22:00:00 6 via TOPICAL

## 2021-09-16 MED ORDER — POTASSIUM CHLORIDE 2 MEQ/ML IV SOLN
80.0000 meq | INTRAVENOUS | Status: DC
  Filled 2021-09-16: qty 40

## 2021-09-16 MED ORDER — POTASSIUM CHLORIDE CRYS ER 20 MEQ PO TBCR
40.0000 meq | EXTENDED_RELEASE_TABLET | Freq: Once | ORAL | Status: AC
  Administered 2021-09-16: 20:00:00 40 meq via ORAL
  Filled 2021-09-16: qty 2

## 2021-09-16 MED ORDER — MANNITOL 20 % IV SOLN
Freq: Once | INTRAVENOUS | Status: DC
  Filled 2021-09-16: qty 13

## 2021-09-16 MED ORDER — TRANEXAMIC ACID 1000 MG/10ML IV SOLN
1.5000 mg/kg/h | INTRAVENOUS | Status: AC
  Administered 2021-09-17: 09:00:00 1.5 mg/kg/h via INTRAVENOUS
  Filled 2021-09-16: qty 25

## 2021-09-16 MED ORDER — POTASSIUM CHLORIDE CRYS ER 20 MEQ PO TBCR
40.0000 meq | EXTENDED_RELEASE_TABLET | Freq: Once | ORAL | Status: AC
  Administered 2021-09-16: 10:00:00 40 meq via ORAL
  Filled 2021-09-16: qty 2

## 2021-09-16 MED ORDER — VASOPRESSIN 20 UNITS/100 ML INFUSION FOR SHOCK
0.0400 [IU]/min | INTRAVENOUS | Status: AC
  Administered 2021-09-17: 11:00:00 .04 [IU]/min via INTRAVENOUS
  Filled 2021-09-16: qty 100

## 2021-09-16 MED ORDER — MANNITOL 20 % IV SOLN
INTRAVENOUS | Status: DC
  Filled 2021-09-16: qty 13

## 2021-09-16 MED ORDER — CEFAZOLIN SODIUM-DEXTROSE 2-4 GM/100ML-% IV SOLN
2.0000 g | INTRAVENOUS | Status: AC
  Administered 2021-09-17: 09:00:00 2 g via INTRAVENOUS
  Filled 2021-09-16: qty 100

## 2021-09-16 NOTE — Progress Notes (Addendum)
Patient ID: Isaac [name], male   DOB: October 24, 1950, 70 y.o.   MRN: [id number]     Advanced Heart Failure Rounding Note  PCP-Cardiologist: None   Subjective:    12/19 RHC- RA 7, PA 39/14 (25), PCWP 11, CO 6.4 CI 3. Thermo 3.5 1.8. Lasix drip stopped after cath. 12/20 Swan removed.  Norepi 3 mcg added.  12/21 Fever. Blood CX drawn. UA + leukocytes. Started on vanc and cefepime. Diuresed with IV lasix + metolazone.  12/23 Impella 5.5 placed  Impella 5.5 placed 12/23.  Now on heparin gtt, still with pain at Impella site but no change in site appearance.   Remains on milrinone 0.5. NE off. Impella continues at P8.  Patient had Lasix 80 mg IV bid yesterday with tolvaptan, excellent diuresis with weight down 7 lbs.   He remains cefepime post-fever last week, ?from phlebitis.   Impella: P8  Flow 4.1 L/min  SWAN CVP 9 PA 39/15 PCWP - unable to wedge CI 2.6 Co-ox 65%  Creatinine 2.14 > 2.40 > 2.24 > 2.38 > 1.85 > 1.98 > 1.70 > 1.49 > 1.71  LDH  272 -> 383 -> 410 -> 407 Plts 120 -> 149  No complaints, remains afebrile.  Was up to chair yesterday.   Objective:   Weight Range: 77.5 kg Body mass index is 25.23 kg/m.   Vital Signs:   Temp:  [97.9 F (36.6 C)-98.8 F (37.1 C)] 98.8 F (37.1 C) (12/27 0700) Pulse Rate:  [65-118] 118 (12/27 0700) Resp:  [12-24] 17 (12/27 0700) BP: (82-149)/(56-131) 111/100 (12/27 0600) SpO2:  [90 %-98 %] 95 % (12/27 0700) Arterial Line BP: (97-106)/(57-70) 102/70 (12/26 0911) Weight:  [77.5 kg] 77.5 kg (12/27 0600) Last BM Date: 09/10/21  Weight change: Filed Weights   09/14/21 0500 09/15/21 0600 09/16/21 0600  Weight: 81.6 kg 80.3 kg 77.5 kg    Intake/Output:   Intake/Output Summary (Last 24 hours) at 09/16/2021 0744 Last data filed at 09/16/2021 0700 Gross per 24 hour  Intake 2334.93 ml  Output 9125 ml  Net -6790.07 ml      Physical Exam   General: NAD. Impella 5.5 right upper chest wall.  Neck: JVP 10 cm, no thyromegaly  or thyroid nodule.  Lungs: Clear to auscultation bilaterally with normal respiratory effort. CV: Nondisplaced PMI.  Heart regular S1/S2, no S3/S4, no murmur.  Trace ankle edema.  Abdomen: Soft, nontender, no hepatosplenomegaly, no distention.  Skin: Intact without lesions or rashes.  Neurologic: Alert and oriented x 3.  Psych: Normal affect. Extremities: No clubbing or cyanosis. Ecchymosis left forearm, improved.  HEENT: Normal.    Telemetry   Atrial fibrillation 80-90 Personally reviewed  Labs    CBC Recent Labs    09/15/21 0408 09/15/21 0426 09/16/21 0415 09/16/21 0515  WBC 6.7  --  7.9  --   HGB 9.6*   < > 8.7* 10.5*  HCT 27.6*   < > 26.1* 31.0*  MCV 87.1  --  89.1  --   PLT 128*  --  149*  --    < > = values in this interval not displayed.   Basic Metabolic Panel  Recent Labs    09/15/21 0408 09/15/21 0426 09/16/21 0415 09/16/21 0515  NA 126*   < > 133* 135  K 3.7   < > 4.1 4.2  CL 93*  --  96*  --   CO2 25  --  29  --   GLUCOSE 105*  --  110*  --  BUN 34*  --  35*  --   CREATININE 1.49*  --  1.71*  --   CALCIUM 8.5*  --  8.7*  --   MG 1.9  --  2.1  --    < > = values in this interval not displayed.   Liver Function Tests No results for input(s): AST, ALT, ALKPHOS, BILITOT, PROT, ALBUMIN in the last 72 hours.  No results for input(s): LIPASE, AMYLASE in the last 72 hours. Cardiac Enzymes No results for input(s): CKTOTAL, CKMB, CKMBINDEX, TROPONINI in the last 72 hours.  BNP: BNP (last 3 results) Recent Labs    08/22/21 0510 09/04/21 1443 09/06/21 1243  BNP 1,593.4* 1,644.2* 1,614.8*    ProBNP (last 3 results) No results for input(s): PROBNP in the last 8760 hours.   D-Dimer No results for input(s): DDIMER in the last 72 hours. Hemoglobin A1C No results for input(s): HGBA1C in the last 72 hours. Fasting Lipid Panel No results for input(s): CHOL, HDL, LDLCALC, TRIG, CHOLHDL, LDLDIRECT in the last 72 hours.  Thyroid Function Tests No  results for input(s): TSH, T4TOTAL, T3FREE, THYROIDAB in the last 72 hours.  Invalid input(s): FREET3   Other results:   Imaging    No results found.   Medications:     Scheduled Medications:  Chlorhexidine Gluconate Cloth  6 each Topical Daily   feeding supplement  237 mL Oral TID BM   furosemide  80 mg Intravenous Once   montelukast  5 mg Oral QHS   multivitamin with minerals  1 tablet Oral Daily   potassium chloride  40 mEq Oral Once   rosuvastatin  10 mg Oral Daily   senna-docusate  2 tablet Oral QHS   sodium chloride flush  10-40 mL Intracatheter Q12H   sodium chloride flush  3 mL Intravenous Q12H   sodium chloride flush  3 mL Intravenous Q12H   sorbitol  30 mL Oral Once    Infusions:  sodium chloride Stopped (09/10/21 1119)   amiodarone 30 mg/hr (09/16/21 0600)   ceFEPime (MAXIPIME) IV Stopped (09/15/21 2202)   heparin 50 units/mL (Impella PURGE) in dextrose 5 % 1000 mL bag     heparin 400 Units/hr (09/16/21 0600)   lactated ringers 20 mL/hr at 09/16/21 0600   milrinone 0.5 mcg/kg/min (09/16/21 0641)   norepinephrine (LEVOPHED) Adult infusion Stopped (09/15/21 0653)    PRN Medications: sodium chloride, sodium chloride, acetaminophen, albuterol, lactated ringers, morphine injection, ondansetron (ZOFRAN) IV, oxyCODONE, sodium chloride flush, sodium chloride flush, Thrombi-Pad    Assessment/Plan   1. Acute on chronic systolic CHF:  Long-standing cardiomyopathy.  [place].  Echo this admission with EF < 20%, severe LV dilation, restrictive diastolic function, moderate RV dysfunction, moderate MR, mod-severe TR. Cause of cardiomyopathy is uncertain.  He has a history of CAD, but I do not think that the described CAD from the past could explain his cardiomyopathy, but CAD could have progressed.  With difficulty tolerating GDMT/need for midodrine and cardiorenal syndrome as well as profound volume overload + NYHA class IV symptoms,  concerned for low  output HF.  Co-ox off milrinone was 36%, milrinone started and increased to 0.375 but CO remained low. NE added and Impella 5.5 placed 12/23. EF 10% on TEE 12/23. Co-ox 65% with CI 2.6 on milrinone 0.5, Impella P-8.  No Impella alarms at P8.  Good diuresis yesterday with weight down 7 lbs, creatinine mildly higher at 1.71.  CVP 9.  - Lasix 80 mg IV x 1  today.  - Continue Impella at P8, no alarms.  Now on heparin gtt with higher plts and slightly lower LDH.  Will check position by echo  - Discussed with Dr. Cyndia Bent, he will see for LVAD consideration.  LVAD coordinators following think he would be a reasonable candidate.  Creatinine has been as low as 1.49. Has moderate-severe TR, would need to consider TV repair.  2. Atrial fibrillation: Persistent since 10/22 based on device interrogation.  Remains on amio for rate control.  - Continue amiodarone gtt.  - Can consider DC-CV at time of VAD -  Heparin gtt  3. AKI on CKD stage 3: Creatinine 1.99 => 2.14=>2.4 -> 1.85 -> 1.98 -> 1.70 -> 1.49 -> 1.71.   - Continue hemodynamic support. - CVP lower so will back off on diuresis today.  4. Syncope: Prior to admission, stood up then passed out.  ?Orthostatic versus arrhythmic.  He had VT run terminated by ATP that may have been around the time of syncope.   - Now on amiodarone gtt.  5. Elevated TSH/elevated free T4: Mixed picture, ?sick euthyroid.   6. CAD: History of PCI to OM1 in 2007 and RCA in 2013.  No chest pain or ACS.  - Deferring cardiac cath due to AKI and plan for VAD - Continue Crestor.  7. Malnutrition: Prealbumin 7.8.  He is eating well.  - Dietitian following 8. ID: Had fever 12/21, PCT 1.03.  Has been afebrile since with normal WBCs.  Blood cultures negative.  ?Phlebitis at IV site.  Stopped vancomycin 12/26.   - Continue cefepime for now.  9. L Pleural Effusion: Noted on CT.  10. Gout: Uric Acid 13.3. No evidence of gout flare 11. Elevated PSA: PSA 7.2  12. Hypokalemia - Supplement.   13. Hyponatremia: Hypervolemic hyponatremia.  Na higher today at 133.  Tolvaptan 12/26.   - Restrict FW 14. Thrombocytopenia: Mild, improving today.  15. Anemia: Hgb lower at 8.7 today, follow.  - Transfuse hgb < 8.   Mobilize.   CRITICAL CARE Performed by: Loralie Champagne  Total critical care time: 40 minutes  Critical care time was exclusive of separately billable procedures and treating other patients.  Critical care was necessary to treat or prevent imminent or life-threatening deterioration.  Critical care was time spent personally by me (independent of midlevel providers or residents) on the following activities: development of treatment plan with patient and/or surrogate as well as nursing, discussions with consultants, evaluation of patient's response to treatment, examination of patient, obtaining history from patient or surrogate, ordering and performing treatments and interventions, ordering and review of laboratory studies, ordering and review of radiographic studies, pulse oximetry and re-evaluation of patient's condition.   Length of Stay: [place], MD  09/16/2021, 7:44 AM  Advanced Heart Failure Team Pager [phone] (M-F; 7a - 5p)  Please contact [facility] for night-coverage after hours (5p -7a ) and weekends on [email or web address]  Impella position checked by echo, it is at 5.0 cm and appears in adequate position.  LVEDD about 6 cm, EF 20%, RV mild-moderately dysfunctional.   Loralie Champagne 09/16/2021 11:11 AM

## 2021-09-16 NOTE — Progress Notes (Addendum)
ANTICOAGULATION CONSULT NOTE - Follow Up Consult  Pharmacy Consult for heparin Indication:  Impella/Afib  Labs: Recent Labs    09/14/21 0420 09/15/21 0408 09/15/21 0426 09/15/21 2057 09/16/21 0415  HGB 10.7* 9.6* 10.5*  --  8.7*  HCT 30.8* 27.6* 31.0*  --  26.1*  PLT 158 128*  --   --  149*  HEPARINUNFRC <0.10* <0.10*  --  <0.10* <0.10*  CREATININE 1.70* 1.49*  --   --  1.71*    Assessment: 70yo male remains subtherapeutic on heparin after conservative rate change, purge remains steady +/- 450 units/hr; no infusion issues or signs of bleeding per RN.  Goal of Therapy:  Heparin level 0.3-0.5 units/ml   Plan:  Will increase heparin infusion by 100 units/hr to 400 units/hr (total ~850 units/hr with purge) and check level in 8 hours.    Wynona Neat, PharmD, BCPS  09/16/2021,5:01 AM

## 2021-09-16 NOTE — Progress Notes (Signed)
[facility] APP text-paged for KCL replacement. K 3.7. Awaiting orders.

## 2021-09-16 NOTE — Progress Notes (Signed)
Palliative:  HPI: 70 y.o. male  with past medical history of advanced stage systolic CHF EF < 37%, s/p AICDPPM, PAF on Eliquis, CKD stage 3b, HTN, HLD admitted on 09/06/2021 with near syncope, nausea, poor intake, generalized weakness with heart failure decompensation and cardiogenic shock. Poor improvement with optimizing heart support in ICU. Plans for Impella placement and LVAD evaluation in process. Concerns for renal function and RV function for potential LVAD.    I met today with Isaac [name]. He continues express desire to move forward with VAD procedure. He is more engaging in conversation today. He asks many insightful questions regarding VAD itself, procedure, recovery process, and post care at home and medication regimen which I answered to the best of my ability. I explained to him the role of the heart failure team and LVAD coordinators to being his main support and go to for any questions or concerns throughout this process and once he returns home. He understands the importance of his support team and support from the LVAD team through this process. He is hopeful to move forward with LVAD placement in the near future. Isaac [name] also expresses that he is hopeful to do well with LVAD and that his wife knows his wishes. He does wish to pursue aggressive interventions if they are thought to have hope to help him improve and benefit but he would not want to explore interventions if they are not likely to help him improve and if his outcome is expected to be poor.   All questions/concerns addressed. Emotional support provided.   Exam: Alert, oriented.  No distress. Impella in place and tolerating - some pain with positioning at insertion site. Diuresing well. HR RRR, breathing regular, unlabored. Abd flat.   Plan: - Moving forward with LVAD with hopes of improvement in quality and quantity of life.   25 min  Vinie Sill, NP Palliative Medicine Team Pager [phone] (Please see [email or web address] for  schedule) Team Phone [phone]    Greater than 50%  of this time was spent counseling and coordinating care related to the above assessment and plan

## 2021-09-16 NOTE — Progress Notes (Signed)
°  Echocardiogram 2D Echocardiogram has been performed.  Johny Chess 09/16/2021, 11:33 AM

## 2021-09-16 NOTE — Progress Notes (Signed)
Occupational Therapy Treatment Patient Details Name: Isaac [name] MRN: [id number] DOB: 09/29/1950 Today's Date: 09/16/2021   History of present illness Pt is a 70 y.o. male admitted 09/07/21 with near-syncope, nausea, poor oral intake, generalized weakness. Workup for advanced heart failure and persistent inotropic with requirement, cardiogenic shock; potential destination LVAD. [place] 12/19. Impella placed on 12/23 with plan for LVAD during admission. PMH includes advanced CHF (LVEF <20%), AICD/PPM, PAF on Eliquis, CKD 3, HTN, HLD.   OT comments  Pt seen for first OT session since impella placement. Despite pain and precautions with R UE from impella placement, pt still completing ADLs/transfers with assist for line mgmt only. Began education on sternal precautions for anticipated LVAD placement during this admission with pt able to demo implementation of precautions well. DC recs remain appropriate. Per RN, LVAD planned for this week - will hold OT services and follow-up s/p LVAD.    Recommendations for follow up therapy are one component of a multi-disciplinary discharge planning process, led by the attending physician.  Recommendations may be updated based on patient status, additional functional criteria and insurance authorization.    Follow Up Recommendations  No OT follow up    Assistance Recommended at Discharge PRN  Equipment Recommendations  Tub/shower seat    Recommendations for Other Services      Precautions / Restrictions Precautions Precautions: Fall;Other (comment) Precaution Comments: impella R axillary, [place] R IJ Restrictions Weight Bearing Restrictions: No Other Position/Activity Restrictions: impella, limit weightbearing, no shoulder flex above 90*       Mobility Bed Mobility Overal bed mobility: Needs Assistance Bed Mobility: Supine to Sit;Sit to Supine     Supine to sit: Supervision Sit to supine: Supervision   General bed mobility comments: for  line safety    Transfers Overall transfer level: Independent Equipment used: None Transfers: Sit to/from Stand Sit to Stand: Independent           General transfer comment: practiced sit to stands x 5 at bedside while maintaining sternal precautions, no assist needed     Balance Overall balance assessment: Needs assistance Sitting-balance support: No upper extremity supported;Feet supported Sitting balance-Leahy Scale: Good     Standing balance support: No upper extremity supported;During functional activity Standing balance-Leahy Scale: Good                             ADL either performed or assessed with clinical judgement   ADL Overall ADL's : Needs assistance/impaired                                       General ADL Comments: Session focused on pre op education on precautions s/p planned LVAD with translation to ADLs/transfers without pushing up from surfaces. handout provided    Extremity/Trunk Assessment Upper Extremity Assessment Upper Extremity Assessment: RUE deficits/detail RUE Deficits / Details: impella placed, no edema noted. AROM of hand, wrist elbow WFL.   Lower Extremity Assessment Lower Extremity Assessment: Defer to PT evaluation        Vision   Vision Assessment?: No apparent visual deficits   Perception     Praxis      Cognition Arousal/Alertness: Awake/alert Behavior During Therapy: WFL for tasks assessed/performed Overall Cognitive Status: Within Functional Limits for tasks assessed  Exercises Exercises: Other exercises Other Exercises Other Exercises: x3 sit-stand from EOB while maintaining sternal precautions   Shoulder Instructions       General Comments VSS on RA. Per RN, MD advises against long distance mobility this afternoon    Pertinent Vitals/ Pain       Pain Assessment: Faces Faces Pain Scale: Hurts even more Pain Location: R  side near impella Pain Descriptors / Indicators: Discomfort;Grimacing Pain Intervention(s): Monitored during session;Repositioned;Limited activity within patient's tolerance  Home Living                                          Prior Functioning/Environment              Frequency  Min 2X/week        Progress Toward Goals  OT Goals(current goals can now be found in the care plan section)  Progress towards OT goals: Progressing toward goals  Acute Rehab OT Goals Patient Stated Goal: make it through surgery OT Goal Formulation: With patient Time For Goal Achievement: 09/21/21 Potential to Achieve Goals: Good ADL Goals Pt Will Perform Lower Body Bathing: with modified independence;sit to/from stand Pt Will Perform Lower Body Dressing: with modified independence;sit to/from stand Pt Will Transfer to Toilet: with modified independence;ambulating Additional ADL Goal #1: Pt will tolerate at least 8 minutes of OOB functional task without reast break to incrased activity tolerance Additional ADL Goal #2: pt will indep verbalize at least 2 energy conservation strategies to apply at d/c.  Plan Discharge plan remains appropriate    Co-evaluation                 AM-PAC OT "6 Clicks" Daily Activity     Outcome Measure   Help from another person eating meals?: None Help from another person taking care of personal grooming?: A Little Help from another person toileting, which includes using toliet, bedpan, or urinal?: A Little Help from another person bathing (including washing, rinsing, drying)?: A Little Help from another person to put on and taking off regular upper body clothing?: A Little Help from another person to put on and taking off regular lower body clothing?: A Little 6 Click Score: 19    End of Session    OT Visit Diagnosis: Unsteadiness on feet (R26.81);Other abnormalities of gait and mobility (R26.89);Muscle weakness (generalized)  (M62.81)   Activity Tolerance Patient tolerated treatment well   Patient Left in bed;with call bell/phone within reach   Nurse Communication          Time: 2683-4196 OT Time Calculation (min): 28 min  Charges: OT General Charges $OT Visit: 1 Visit OT Treatments $Self Care/Home Management : 8-22 mins $Therapeutic Activity: 8-22 mins  Malachy Chamber, OTR/L Acute Rehab Services Office: [phone]   Layla Maw 09/16/2021, 2:09 PM

## 2021-09-16 NOTE — Progress Notes (Signed)
Brief VAD coordinator rounding note:  I spoke with patient at bedside regarding possible VAD implant tomorrow. All questions answered at this time. Completed pre-Intermacs including:  Quality of Life, KCCQ-12, and Neurocognitive trail making. KCCQ documented in separate criteria note.   I spoke with patient's wife [name] over the phone. All questions regarding plan for tomorrow answered at this time. Will plan to call her cell phone tomorrow with updates.   Provided patient with incentive spirometer and encouraged to use 10 x per hour. Reaching 1250 consistently.   Emerson Monte RN [place] Coordinator  Office: [phone]  24/7 Pager: [phone]

## 2021-09-16 NOTE — Consult Note (Signed)
[address]       [place] [address]             [phone]      Cardiothoracic Surgery Consultation  Reason for Consult: End stage heart failure Referring Physician: Dr. Loralie Champagne  Isaac [name] is an 70 y.o. male.  HPI:   The patient is a 70 year old gentleman with a history of hypertension, hyperlipidemia, atrial fibrillation on amiodarone and Eliquis, stage IIIb chronic kidney disease, coronary artery disease, longstanding cardiomyopathy with an ejection fraction of less than 20% with congestive heart failure who was admitted on 09/06/2021 for feeling poorly with NYHA class IV congestive heart failure symptoms.  Had worsening shortness of breath with exertion as well as orthopnea and PND and and had a syncopal episode.  He was having lower extremity abdominal swelling.  He was diuresed and started on milrinone and had a right heart cath on 09/08/2021 showing a right atrial pressure of 7 with PA pressure of 39/14 and wedge pressure of 11.  Cardiac index was 1.8 by thermodilution.  Lasix drip was stopped after his cath.  He was started on norepinephrine for blood pressure support.  He had a fever on 1221 and blood cultures were drawn which were negative.  He had leukocytes in his urine and was started on vancomycin and cefepime empirically.  Urine culture was negative.  He failed to wean from milrinone with a low Co-ox and an Impella 5.5 was placed on 09/12/2021.  He has remained on milrinone 0.5 with the Impella at P8.  His most recent right heart numbers are:  Impella: P8  Flow 4.1 L/min   SWAN CVP 9 PA 39/15 PCWP - unable to wedge CI 2.6 Co-ox 65%  His creatinine has improved to 1.49 but is increased to 1.71 today after diuresis.  His weight was down 7 pounds after diuresis yesterday.  Past Medical History:  Diagnosis Date   Arrhythmia    atrial fibrillation   CHF (congestive heart failure) ([facility])    Chronic kidney disease    Coronary artery  disease    Hyperlipidemia    Hypertension    Myocardial infarct [facility])     Past Surgical History:  Procedure Laterality Date   CARDIAC DEFIBRILLATOR PLACEMENT  feb 2014   PLACEMENT OF IMPELLA LEFT VENTRICULAR ASSIST DEVICE N/A 09/12/2021   Procedure: PLACEMENT OF IMPELLA 5.5 LEFT VENTRICULAR ASSIST DEVICE;  Surgeon: Melrose Nakayama, MD;  Location: [facility];  Service: Open Heart Surgery;  Laterality: N/A;   RIGHT HEART CATH N/A 09/08/2021   Procedure: RIGHT HEART CATH;  Surgeon: Larey Dresser, MD;  Location: [facility];  Service: Cardiovascular;  Laterality: N/A;   TEE WITHOUT CARDIOVERSION N/A 09/12/2021   Procedure: TRANSESOPHAGEAL ECHOCARDIOGRAM (TEE);  Surgeon: Melrose Nakayama, MD;  Location: [facility];  Service: Open Heart Surgery;  Laterality: N/A;    History reviewed. No pertinent family history.  Social History:  reports that he has quit smoking. He has never used smokeless tobacco. He reports that he does not drink alcohol and does not use drugs.  Allergies:  Allergies  Allergen Reactions   Mushroom Extract Complex Nausea And Vomiting   Neosporin [Neomycin-Bacitracin Zn-Polymyx] Hives    Medications: I have reviewed the patient's current medications. Prior to Admission:  Medications Prior to Admission  Medication Sig Dispense Refill Last Dose   albuterol (VENTOLIN HFA) 108 (90 Base) MCG/ACT inhaler Inhale 2 puffs into the lungs every  6 (six) hours as needed.   unknown  ° apixaban (ELIQUIS) 5 MG TABS tablet Take 1 tablet (5 mg total) by mouth 2 (two) times daily. 60 tablet 2 09/06/2021 at 0500  ° furosemide (LASIX) 20 MG tablet Take 1 tablet (20 mg total) by mouth daily as needed for edema or fluid (sob). (Patient taking differently: Take 20 mg by mouth daily.) 90 tablet 0 Past Week  ° midodrine (PROAMATINE) 2.5 MG tablet Take 1 tablet (2.5 mg total) by mouth 3 (three) times daily with meals. 90 tablet 2 09/06/2021  ° amiodarone (PACERONE) 200 MG tablet Take 1  tablet (200 mg total) by mouth 2 (two) times daily. (Patient not taking: Reported on 08/29/2021) 60 tablet 2 Not Taking  ° metoprolol tartrate (LOPRESSOR) 25 MG tablet Take 0.5 tablets (12.5 mg total) by mouth 2 (two) times daily. (Patient not taking: Reported on 08/29/2021) 30 tablet 2 Not Taking  ° montelukast (SINGULAIR) 10 MG tablet Take by mouth. (Patient not taking: Reported on 09/06/2021)   Not Taking  ° spironolactone (ALDACTONE) 25 MG tablet Take 0.5 tablets (12.5 mg total) by mouth daily. (Patient not taking: Reported on 08/29/2021) 15 tablet 2 Not Taking  ° tamsulosin (FLOMAX) 0.4 MG CAPS capsule TAKE 1 CAPSULE (0.4 MG TOTAL) BY MOUTH DAILY AFTER BREAKFAST. (Patient not taking: Reported on 08/29/2021) 30 capsule 0 Not Taking  ° °Scheduled: ° Chlorhexidine Gluconate Cloth  6 each Topical Daily  ° [START ON 09/17/2021] epinephrine  0-10 mcg/min Intravenous To OR  ° feeding supplement  237 mL Oral TID BM  ° [START ON 09/17/2021] insulin   Intravenous To OR  ° [START ON 09/17/2021] [place] Blood Cardioplegia vial (lidocaine/magnesium/mannitol 0.26g-4g-6.4g)   Intracoronary To OR  ° montelukast  5 mg Oral QHS  ° multivitamin with minerals  1 tablet Oral Daily  ° [START ON 09/17/2021] phenylephrine  0-100 mcg/min Intravenous To OR  ° [START ON 09/17/2021] potassium chloride  80 mEq Other To OR  ° rosuvastatin  10 mg Oral Daily  ° senna-docusate  2 tablet Oral QHS  ° sodium chloride flush  10-40 mL Intracatheter Q12H  ° sodium chloride flush  3 mL Intravenous Q12H  ° sodium chloride flush  3 mL Intravenous Q12H  ° [START ON 09/17/2021] tranexamic acid  15 mg/kg (Adjusted) Intravenous To OR  ° [START ON 09/17/2021] tranexamic acid  2 mg/kg Intracatheter To OR  ° [START ON 09/17/2021] vancomycin  1,000 mg Other To OR  ° [START ON 09/17/2021] vasopressin  0.04 Units/min Intravenous To OR  ° °Continuous: ° sodium chloride Stopped (09/10/21 1119)  ° amiodarone 30 mg/hr (09/16/21 1900)  ° [START ON 09/17/2021]   ceFAZolin (ANCEF) IV    ° [START ON 09/17/2021]  ceFAZolin (ANCEF) IV    ° ceFEPime (MAXIPIME) IV Stopped (09/16/21 1028)  ° [START ON 09/17/2021] dexmedetomidine    ° [START ON 09/17/2021] DOBUTamine    ° [START ON 09/17/2021] DOPamine    ° [START ON 09/17/2021] fluconazole (DIFLUCAN) IV    ° [START ON 09/17/2021] heparin 30,000 units/NS 1000 mL solution for CELLSAVER    ° heparin 50 units/mL (Impella PURGE) in dextrose 5 % 1000 mL bag    ° heparin 500 Units/hr (09/16/21 1900)  ° lactated ringers 20 mL/hr at 09/16/21 1900  ° milrinone 0.5 mcg/kg/min (09/16/21 1900)  ° [START ON 09/17/2021] milrinone    ° [START ON 09/17/2021] nitroGLYCERIN    ° norepinephrine (LEVOPHED) Adult infusion Stopped (09/15/21 0653)  ° [START ON   09/17/2021] norepinephrine (LEVOPHED) Adult infusion     [START ON 09/17/2021] rifampin (RIFADIN) IVPB     [START ON 09/17/2021] tranexamic acid (CYKLOKAPRON) infusion ([facility])     [START ON 09/17/2021] vancomycin     ONG:EXBMWU chloride, sodium chloride, acetaminophen, albuterol, lactated ringers, morphine injection, ondansetron (ZOFRAN) IV, oxyCODONE, sodium chloride flush, sodium chloride flush, Thrombi-Pad Anti-infectives (From admission, onward)    Start     Dose/Rate Route Frequency Ordered Stop   09/17/21 0400  vancomycin (VANCOREADY) IVPB 1250 mg/250 mL        1,250 mg 166.7 mL/hr over 90 Minutes Intravenous To Surgery 09/16/21 1258 09/18/21 0400   09/17/21 0400  ceFAZolin (ANCEF) IVPB 2g/100 mL premix        2 g 200 mL/hr over 30 Minutes Intravenous To Surgery 09/16/21 1258 09/18/21 0400   09/17/21 0400  ceFAZolin (ANCEF) IVPB 2g/100 mL premix        2 g 200 mL/hr over 30 Minutes Intravenous To Surgery 09/16/21 1258 09/18/21 0400   09/17/21 0400  fluconazole (DIFLUCAN) IVPB 400 mg        400 mg 100 mL/hr over 120 Minutes Intravenous To Surgery 09/16/21 1258 09/18/21 0400   09/17/21 0400  rifampin (RIFADIN) 600 mg in sodium chloride 0.9 % 100 mL IVPB        600 mg 200  mL/hr over 30 Minutes Intravenous To Surgery 09/16/21 1258 09/18/21 0400   09/17/21 0400  vancomycin (VANCOCIN) powder 1,000 mg        1,000 mg Other To Surgery 09/16/21 1258 09/18/21 0400   09/14/21 2200  ceFEPIme (MAXIPIME) 2 g in sodium chloride 0.9 % 100 mL IVPB        2 g 200 mL/hr over 30 Minutes Intravenous Every 12 hours 09/14/21 1202     09/13/21 1200  vancomycin (VANCOCIN) IVPB 1000 mg/200 mL premix  Status:  Discontinued        1,000 mg 200 mL/hr over 60 Minutes Intravenous Every 24 hours 09/12/21 1152 09/15/21 0856   09/12/21 1400  vancomycin (VANCOREADY) IVPB 1500 mg/300 mL  Status:  Discontinued        1,500 mg 150 mL/hr over 120 Minutes Intravenous Every 48 hours 09/10/21 1120 09/12/21 1148   09/12/21 0845  vancomycin (VANCOCIN) IVPB 1000 mg/200 mL premix        1,000 mg 200 mL/hr over 60 Minutes Intravenous To Surgery 09/12/21 0838 09/12/21 0840   09/11/21 1000  ceFEPIme (MAXIPIME) 2 g in sodium chloride 0.9 % 100 mL IVPB  Status:  Discontinued        2 g 200 mL/hr over 30 Minutes Intravenous Every 24 hours 09/11/21 0740 09/14/21 1202   09/10/21 1100  vancomycin (VANCOREADY) IVPB 1500 mg/300 mL        1,500 mg 150 mL/hr over 120 Minutes Intravenous  Once 09/10/21 0855 09/10/21 1320   09/10/21 1000  ceFEPIme (MAXIPIME) 2 g in sodium chloride 0.9 % 100 mL IVPB  Status:  Discontinued        2 g 200 mL/hr over 30 Minutes Intravenous Every 12 hours 09/10/21 0854 09/11/21 0740        Results for orders placed or performed during the hospital encounter of 09/06/21 (from the past 48 hour(s))  .Cooxemetry Panel (carboxy, met, total hgb, O2 sat)     Status: Abnormal   Collection Time: 09/14/21  7:45 PM  Result Value Ref Range   Total hemoglobin 10.2 (L) 12.0 - 16.0 g/dL   O2 Saturation  53.9 %   Carboxyhemoglobin 2.0 (H) 0.5 - 1.5 %   Methemoglobin 0.9 0.0 - 1.5 %    Comment: Performed at [facility] [address], [place], [place] [address]  .Cooxemetry Panel  (carboxy, met, total hgb, O2 sat)     Status: Abnormal   Collection Time: 09/15/21 12:04 AM  Result Value Ref Range   Total hemoglobin 9.7 (L) 12.0 - 16.0 g/dL   O2 Saturation 61.4 %   Carboxyhemoglobin 2.2 (H) 0.5 - 1.5 %   Methemoglobin 0.9 0.0 - 1.5 %    Comment: Performed at [facility] [address] [place], [place] [address]  .Cooxemetry Panel (carboxy, met, total hgb, O2 sat)     Status: Abnormal   Collection Time: 09/15/21  4:06 AM  Result Value Ref Range   Total hemoglobin 12.6 12.0 - 16.0 g/dL   O2 Saturation 59.1 %   Carboxyhemoglobin 2.2 (H) 0.5 - 1.5 %   Methemoglobin 0.6 0.0 - 1.5 %    Comment: Performed at [facility] [address] [place], [place] [address]  Magnesium     Status: None   Collection Time: 09/15/21  4:08 AM  Result Value Ref Range   Magnesium 1.9 1.7 - 2.4 mg/dL    Comment: Performed at [facility] [address] [place], [place] [address]  Basic metabolic panel     Status: Abnormal   Collection Time: 09/15/21  4:08 AM  Result Value Ref Range   Sodium 126 (L) 135 - 145 mmol/L   Potassium 3.7 3.5 - 5.1 mmol/L   Chloride 93 (L) 98 - 111 mmol/L   CO2 25 22 - 32 mmol/L   Glucose, Bld 105 (H) 70 - 99 mg/dL    Comment: Glucose reference range applies only to samples taken after fasting for at least 8 hours.   BUN 34 (H) 8 - 23 mg/dL   Creatinine, Ser 1.49 (H) 0.61 - 1.24 mg/dL   Calcium 8.5 (L) 8.9 - 10.3 mg/dL   GFR, Estimated 50 (L) >60 mL/min    Comment: (NOTE) Calculated using the CKD-EPI Creatinine Equation (2021)    Anion gap 8 5 - 15    Comment: Performed at [facility] [address], [place], [place] [address]  Lactate dehydrogenase (LDH)     Status: Abnormal   Collection Time: 09/15/21  4:08 AM  Result Value Ref Range   LDH 410 (H) 98 - 192 U/L    Comment: Performed at [facility], [place] [address], [place], [place] [address]  CBC     Status: Abnormal   Collection Time: 09/15/21  4:08 AM  Result  Value Ref Range   WBC 6.7 4.0 - 10.5 K/uL   RBC 3.17 (L) 4.22 - 5.81 MIL/uL   Hemoglobin 9.6 (L) 13.0 - 17.0 g/dL   HCT 27.6 (L) 39.0 - 52.0 %   MCV 87.1 80.0 - 100.0 fL   MCH 30.3 26.0 - 34.0 pg   MCHC 34.8 30.0 - 36.0 g/dL   RDW 16.3 (H) 11.5 - 15.5 %   Platelets 128 (L) 150 - 400 K/uL   nRBC 0.0 0.0 - 0.2 %    Comment: Performed at [facility], [place] [address] [place], [place] [address]  Heparin level (unfractionated)     Status: Abnormal   Collection Time: 09/15/21  4:08 AM  Result Value Ref Range   Heparin Unfractionated <0.10 (L) 0.30 -  0.70 IU/mL    Comment: (NOTE) The clinical reportable range upper limit is being lowered to >1.10 to align with the FDA approved guidance for the current laboratory assay.  If heparin results are below expected values, and patient dosage has  been confirmed, suggest follow up testing of antithrombin III levels. Performed at [facility], [place] [address], [place], [place] [address]   I-STAT 7, (LYTES, BLD GAS, ICA, H+H)     Status: Abnormal   Collection Time: 09/15/21  4:26 AM  Result Value Ref Range   pH, Arterial 7.496 (H) 7.350 - 7.450   pCO2 arterial 34.0 32.0 - 48.0 mmHg   pO2, Arterial 76 (L) 83.0 - 108.0 mmHg   Bicarbonate 26.3 20.0 - 28.0 mmol/L   TCO2 27 22 - 32 mmol/L   O2 Saturation 96.0 %   Acid-Base Excess 3.0 (H) 0.0 - 2.0 mmol/L   Sodium 128 (L) 135 - 145 mmol/L   Potassium 3.8 3.5 - 5.1 mmol/L   Calcium, Ion 1.19 1.15 - 1.40 mmol/L   HCT 31.0 (L) 39.0 - 52.0 %   Hemoglobin 10.5 (L) 13.0 - 17.0 g/dL   Patient temperature 36.8 C    Collection site art line    Drawn by RT    Sample type ARTERIAL   Heparin level (unfractionated)     Status: Abnormal   Collection Time: 09/15/21  8:57 PM  Result Value Ref Range   Heparin Unfractionated <0.10 (L) 0.30 - 0.70 IU/mL    Comment: (NOTE) The clinical reportable range upper limit is being lowered to >1.10 to align with the FDA approved guidance for the current  laboratory assay.  If heparin results are below expected values, and patient dosage has  been confirmed, suggest follow up testing of antithrombin III levels. Performed at [facility], [place] [address], [place], [place] [address]   .Cooxemetry Panel (carboxy, met, total hgb, O2 sat)     Status: Abnormal   Collection Time: 09/16/21  4:15 AM  Result Value Ref Range   Total hemoglobin 10.2 (L) 12.0 - 16.0 g/dL   O2 Saturation 65.2 %   Carboxyhemoglobin 2.4 (H) 0.5 - 1.5 %   Methemoglobin 0.9 0.0 - 1.5 %    Comment: Performed at [facility] [address], [place], [place] [address]  Magnesium     Status: None   Collection Time: 09/16/21  4:15 AM  Result Value Ref Range   Magnesium 2.1 1.7 - 2.4 mg/dL    Comment: Performed at [facility] [address] [place],  [address]  Basic metabolic panel     Status: Abnormal   Collection Time: 09/16/21  4:15 AM  Result Value Ref Range   Sodium 133 (L) 135 - 145 mmol/L   Potassium 4.1 3.5 - 5.1 mmol/L   Chloride 96 (L) 98 - 111 mmol/L   CO2 29 22 - 32 mmol/L   Glucose, Bld 110 (H) 70 - 99 mg/dL    Comment: Glucose reference range applies only to samples taken after fasting for at least 8 hours.   BUN 35 (H) 8 - 23 mg/dL   Creatinine, Ser 1.71 (H) 0.61 - 1.24 mg/dL   Calcium 8.7 (L) 8.9 - 10.3 mg/dL   GFR, Estimated 43 (L) >60 mL/min    Comment: (NOTE) Calculated using the CKD-EPI Creatinine Equation (2021)    Anion gap 8 5 - 15    Comment: Performed at [facility],  [address], [place], [place] [address]  Lactate dehydrogenase (LDH)     Status: Abnormal   Collection Time: 09/16/21  4:15 AM  Result Value Ref Range   LDH 407 (H) 98 - 192 U/L    Comment: Performed at [facility] [address] [place], [place] [address]  CBC     Status: Abnormal   Collection Time: 09/16/21  4:15 AM  Result Value Ref Range   WBC 7.9 4.0 - 10.5 K/uL   RBC 2.93 (L) 4.22 - 5.81 MIL/uL   Hemoglobin 8.7 (L)  13.0 - 17.0 g/dL   HCT 26.1 (L) 39.0 - 52.0 %   MCV 89.1 80.0 - 100.0 fL   MCH 29.7 26.0 - 34.0 pg   MCHC 33.3 30.0 - 36.0 g/dL   RDW 16.5 (H) 11.5 - 15.5 %   Platelets 149 (L) 150 - 400 K/uL   nRBC 0.0 0.0 - 0.2 %    Comment: Performed at [facility] [address], [place], [place] [address]  Heparin level (unfractionated)     Status: Abnormal   Collection Time: 09/16/21  4:15 AM  Result Value Ref Range   Heparin Unfractionated <0.10 (L) 0.30 - 0.70 IU/mL    Comment: (NOTE) The clinical reportable range upper limit is being lowered to >1.10 to align with the FDA approved guidance for the current laboratory assay.  If heparin results are below expected values, and patient dosage has  been confirmed, suggest follow up testing of antithrombin III levels. Performed at [facility], [place] [address], [place], [place] [address]   I-STAT 7, (LYTES, BLD GAS, ICA, H+H)     Status: Abnormal   Collection Time: 09/16/21  5:15 AM  Result Value Ref Range   pH, Arterial 7.488 (H) 7.350 - 7.450   pCO2 arterial 40.2 32.0 - 48.0 mmHg   pO2, Arterial 67 (L) 83.0 - 108.0 mmHg   Bicarbonate 30.5 (H) 20.0 - 28.0 mmol/L   TCO2 32 22 - 32 mmol/L   O2 Saturation 94.0 %   Acid-Base Excess 7.0 (H) 0.0 - 2.0 mmol/L   Sodium 135 135 - 145 mmol/L   Potassium 4.2 3.5 - 5.1 mmol/L   Calcium, Ion 1.26 1.15 - 1.40 mmol/L   HCT 31.0 (L) 39.0 - 52.0 %   Hemoglobin 10.5 (L) 13.0 - 17.0 g/dL   Sample type ARTERIAL   Heparin level (unfractionated)     Status: Abnormal   Collection Time: 09/16/21  2:42 PM  Result Value Ref Range   Heparin Unfractionated <0.10 (L) 0.30 - 0.70 IU/mL    Comment: (NOTE) The clinical reportable range upper limit is being lowered to >1.10 to align with the FDA approved guidance for the current laboratory assay.  If heparin results are below expected values, and patient dosage has  been confirmed, suggest follow up testing of antithrombin III levels. Performed at  [facility], [place] [address] [place], [place] [address]     ECHOCARDIOGRAM LIMITED  Result Date: 09/16/2021    ECHOCARDIOGRAM LIMITED REPORT   Patient Name:   Isaac [name] Date of Exam: 09/16/2021 Medical Rec #:  [id number]           Height:       69.0 in Accession #:    [id number]          Weight:       170.9 lb Date of Birth:  1951-07-16  BSA:          1.932 m Patient Age:    51 years            BP:           93/75 mmHg Patient Gender: M                   HR:           95 bpm. Exam Location:  Inpatient Procedure: Limited Echo Indications:    impella placement check  History:        Patient has prior history of Echocardiogram examinations, most                 recent 08/23/2021.  Sonographer:    Johny Chess RDCS Referring Phys: [place]  1. Limited echo for impella posiition. Device approximately 4.95 cm distal to the aortic annulus.  2. Left ventricular ejection fraction, by estimation, is 20 to 25%. The left ventricle has severely decreased function. The left ventricle demonstrates global hypokinesis. The left ventricular internal cavity size was moderately dilated.  3. Left atrial size was moderately dilated.  4. Right atrial size was moderately dilated. FINDINGS  Left Ventricle: Left ventricular ejection fraction, by estimation, is 20 to 25%. The left ventricle has severely decreased function. The left ventricle demonstrates global hypokinesis. The left ventricular internal cavity size was moderately dilated. Left Atrium: Left atrial size was moderately dilated. Right Atrium: Right atrial size was moderately dilated. Pericardium: There is no evidence of pericardial effusion. Additional Comments: Limited echo for impella posiition. Device approximately 4.95 cm distal to the aortic annulus. Jenkins Rouge MD Electronically signed by Jenkins Rouge MD Signature Date/Time: 09/16/2021/12:04:02 PM    Final     Review of Systems  Constitutional:  Positive for  activity change and fatigue. Negative for appetite change, chills and fever.  HENT: Negative.    Eyes: Negative.   Respiratory:  Positive for shortness of breath.   Cardiovascular:  Positive for leg swelling. Negative for chest pain.  Gastrointestinal: Negative.   Endocrine: Negative.   Genitourinary: Negative.   Musculoskeletal: Negative.   Skin: Negative.   Allergic/Immunologic: Negative.   Neurological:  Positive for dizziness and syncope.  Hematological: Negative.   Psychiatric/Behavioral: Negative.    Blood pressure 109/88, pulse 97, temperature 98.4 F (36.9 C), resp. rate 17, height _0  (1.753 m), weight 77.5 kg, SpO2 98 %. Physical Exam Constitutional:      Appearance: He is well-developed and normal weight.  HENT:     Head: Normocephalic and atraumatic.     Mouth/Throat:     Mouth: Mucous membranes are moist.     Pharynx: Oropharynx is clear.  Eyes:     Extraocular Movements: Extraocular movements intact.     Pupils: Pupils are equal, round, and reactive to light.  Neck:     Vascular: No carotid bruit.  Cardiovascular:     Rate and Rhythm: Normal rate. Rhythm irregular.     Pulses: Normal pulses.     Heart sounds: No murmur heard. Pulmonary:     Effort: Pulmonary effort is normal.     Breath sounds: Normal breath sounds.  Abdominal:     General: Abdomen is flat. Bowel sounds are normal. There is no distension.     Palpations: Abdomen is soft.     Tenderness: There is no abdominal tenderness.  Musculoskeletal:        General: No swelling.  Cervical back: Normal range of motion and neck supple.  Skin:    General: Skin is warm and dry.  Neurological:     General: No focal deficit present.     Mental Status: He is alert and oriented to person, place, and time.  Psychiatric:        Mood and Affect: Mood normal.        Behavior: Behavior normal.    Assessment/Plan:  This 70 year old gentleman has longstanding cardiomyopathy of unclear etiology with  admission for acute on chronic systolic congestive heart failure with an echocardiogram showing an ejection fraction less than 20% with severe left ventricular dilation.  There is also moderate RV systolic dysfunction with moderate MR and moderate to severe TR.  He presented with NYHA class IV symptoms with a Co-ox of 36% off milrinone.  Despite increasing milrinone to 0.375 his cardiac output remained low.  He has stabilized since insertion of an Impella 5.5 with a Co-ox of 65% and a cardiac index of 2.6 on milrinone 0.5 and the Impella at P8.  His renal function has improved and he has been diuresing well.  I agree that left ventricular assist device insertion as destination therapy is the best treatment for this patient.  He is tuned up as well as he is going to be.  We will need to evaluate his tricuspid regurgitation in the operating room to decide if tricuspid valve repair is indicated.  He does have moderate RV systolic dysfunction although he has been maintaining relatively low CVP with normal PA pressures and wedge. I discussed the operative procedure with the patient and his wife including alternatives, benefits and risks; including but not limited to bleeding, blood transfusion, infection, stroke, myocardial infarction, pump failure, pump thrombosis, GI bleeding, organ dysfunction, and death.  Isaac [name] understands and agrees to proceed.  We will schedule surgery for tomorrow morning.  Gaye Pollack 09/16/2021, 6:52 PM

## 2021-09-16 NOTE — H&P (View-Only) (Signed)
[address]       [place] [address]             [phone]      Cardiothoracic Surgery Consultation  Reason for Consult: End stage heart failure Referring Physician: Dr. Loralie Champagne  Isaac [name] is an 70 y.o. male.  HPI:   The patient is a 70 year old gentleman with a history of hypertension, hyperlipidemia, atrial fibrillation on amiodarone and Eliquis, stage IIIb chronic kidney disease, coronary artery disease, longstanding cardiomyopathy with an ejection fraction of less than 20% with congestive heart failure who was admitted on 09/06/2021 for feeling poorly with NYHA class IV congestive heart failure symptoms.  Had worsening shortness of breath with exertion as well as orthopnea and PND and and had a syncopal episode.  He was having lower extremity abdominal swelling.  He was diuresed and started on milrinone and had a right heart cath on 09/08/2021 showing a right atrial pressure of 7 with PA pressure of 39/14 and wedge pressure of 11.  Cardiac index was 1.8 by thermodilution.  Lasix drip was stopped after his cath.  He was started on norepinephrine for blood pressure support.  He had a fever on 1221 and blood cultures were drawn which were negative.  He had leukocytes in his urine and was started on vancomycin and cefepime empirically.  Urine culture was negative.  He failed to wean from milrinone with a low Co-ox and an Impella 5.5 was placed on 09/12/2021.  He has remained on milrinone 0.5 with the Impella at P8.  His most recent right heart numbers are:  Impella: P8  Flow 4.1 L/min   SWAN CVP 9 PA 39/15 PCWP - unable to wedge CI 2.6 Co-ox 65%  His creatinine has improved to 1.49 but is increased to 1.71 today after diuresis.  His weight was down 7 pounds after diuresis yesterday.  Past Medical History:  Diagnosis Date   Arrhythmia    atrial fibrillation   CHF (congestive heart failure) ([facility])    Chronic kidney disease    Coronary artery  disease    Hyperlipidemia    Hypertension    Myocardial infarct [facility])     Past Surgical History:  Procedure Laterality Date   CARDIAC DEFIBRILLATOR PLACEMENT  feb 2014   PLACEMENT OF IMPELLA LEFT VENTRICULAR ASSIST DEVICE N/A 09/12/2021   Procedure: PLACEMENT OF IMPELLA 5.5 LEFT VENTRICULAR ASSIST DEVICE;  Surgeon: Melrose Nakayama, MD;  Location: [facility];  Service: Open Heart Surgery;  Laterality: N/A;   RIGHT HEART CATH N/A 09/08/2021   Procedure: RIGHT HEART CATH;  Surgeon: Larey Dresser, MD;  Location: [facility];  Service: Cardiovascular;  Laterality: N/A;   TEE WITHOUT CARDIOVERSION N/A 09/12/2021   Procedure: TRANSESOPHAGEAL ECHOCARDIOGRAM (TEE);  Surgeon: Melrose Nakayama, MD;  Location: [facility];  Service: Open Heart Surgery;  Laterality: N/A;    History reviewed. No pertinent family history.  Social History:  reports that he has quit smoking. He has never used smokeless tobacco. He reports that he does not drink alcohol and does not use drugs.  Allergies:  Allergies  Allergen Reactions   Mushroom Extract Complex Nausea And Vomiting   Neosporin [Neomycin-Bacitracin Zn-Polymyx] Hives    Medications: I have reviewed the patient's current medications. Prior to Admission:  Medications Prior to Admission  Medication Sig Dispense Refill Last Dose   albuterol (VENTOLIN HFA) 108 (90 Base) MCG/ACT inhaler Inhale 2 puffs into the lungs every  6 (six) hours as needed.   unknown   apixaban (ELIQUIS) 5 MG TABS tablet Take 1 tablet (5 mg total) by mouth 2 (two) times daily. 60 tablet 2 09/06/2021 at 0500   furosemide (LASIX) 20 MG tablet Take 1 tablet (20 mg total) by mouth daily as needed for edema or fluid (sob). (Patient taking differently: Take 20 mg by mouth daily.) 90 tablet 0 Past Week   midodrine (PROAMATINE) 2.5 MG tablet Take 1 tablet (2.5 mg total) by mouth 3 (three) times daily with meals. 90 tablet 2 09/06/2021   amiodarone (PACERONE) 200 MG tablet Take 1  tablet (200 mg total) by mouth 2 (two) times daily. (Patient not taking: Reported on 08/29/2021) 60 tablet 2 Not Taking   metoprolol tartrate (LOPRESSOR) 25 MG tablet Take 0.5 tablets (12.5 mg total) by mouth 2 (two) times daily. (Patient not taking: Reported on 08/29/2021) 30 tablet 2 Not Taking   montelukast (SINGULAIR) 10 MG tablet Take by mouth. (Patient not taking: Reported on 09/06/2021)   Not Taking   spironolactone (ALDACTONE) 25 MG tablet Take 0.5 tablets (12.5 mg total) by mouth daily. (Patient not taking: Reported on 08/29/2021) 15 tablet 2 Not Taking   tamsulosin (FLOMAX) 0.4 MG CAPS capsule TAKE 1 CAPSULE (0.4 MG TOTAL) BY MOUTH DAILY AFTER BREAKFAST. (Patient not taking: Reported on 08/29/2021) 30 capsule 0 Not Taking   Scheduled:  Chlorhexidine Gluconate Cloth  6 each Topical Daily   [START ON 09/17/2021] epinephrine  0-10 mcg/min Intravenous To OR   feeding supplement  237 mL Oral TID BM   [START ON 09/17/2021] insulin   Intravenous To OR   [START ON 09/17/2021] [place] Blood Cardioplegia vial (lidocaine/magnesium/mannitol 0.26g-4g-6.4g)   Intracoronary To OR   montelukast  5 mg Oral QHS   multivitamin with minerals  1 tablet Oral Daily   [START ON 09/17/2021] phenylephrine  0-100 mcg/min Intravenous To OR   [START ON 09/17/2021] potassium chloride  80 mEq Other To OR   rosuvastatin  10 mg Oral Daily   senna-docusate  2 tablet Oral QHS   sodium chloride flush  10-40 mL Intracatheter Q12H   sodium chloride flush  3 mL Intravenous Q12H   sodium chloride flush  3 mL Intravenous Q12H   [START ON 09/17/2021] tranexamic acid  15 mg/kg (Adjusted) Intravenous To OR   [START ON 09/17/2021] tranexamic acid  2 mg/kg Intracatheter To OR   [START ON 09/17/2021] vancomycin  1,000 mg Other To OR   [START ON 09/17/2021] vasopressin  0.04 Units/min Intravenous To OR   Continuous:  sodium chloride Stopped (09/10/21 1119)   amiodarone 30 mg/hr (09/16/21 1900)   [START ON 09/17/2021]   ceFAZolin (ANCEF) IV     [START ON 09/17/2021]  ceFAZolin (ANCEF) IV     ceFEPime (MAXIPIME) IV Stopped (09/16/21 1028)   [START ON 09/17/2021] dexmedetomidine     [START ON 09/17/2021] DOBUTamine     [START ON 09/17/2021] DOPamine     [START ON 09/17/2021] fluconazole (DIFLUCAN) IV     [START ON 09/17/2021] heparin 30,000 units/NS 1000 mL solution for CELLSAVER     heparin 50 units/mL (Impella PURGE) in dextrose 5 % 1000 mL bag     heparin 500 Units/hr (09/16/21 1900)   lactated ringers 20 mL/hr at 09/16/21 1900   milrinone 0.5 mcg/kg/min (09/16/21 1900)   [START ON 09/17/2021] milrinone     [START ON 09/17/2021] nitroGLYCERIN     norepinephrine (LEVOPHED) Adult infusion Stopped (09/15/21 0653)   [START ON  09/17/2021] norepinephrine (LEVOPHED) Adult infusion     [START ON 09/17/2021] rifampin (RIFADIN) IVPB     [START ON 09/17/2021] tranexamic acid (CYKLOKAPRON) infusion ([facility])     [START ON 09/17/2021] vancomycin     ONG:EXBMWU chloride, sodium chloride, acetaminophen, albuterol, lactated ringers, morphine injection, ondansetron (ZOFRAN) IV, oxyCODONE, sodium chloride flush, sodium chloride flush, Thrombi-Pad Anti-infectives (From admission, onward)    Start     Dose/Rate Route Frequency Ordered Stop   09/17/21 0400  vancomycin (VANCOREADY) IVPB 1250 mg/250 mL        1,250 mg 166.7 mL/hr over 90 Minutes Intravenous To Surgery 09/16/21 1258 09/18/21 0400   09/17/21 0400  ceFAZolin (ANCEF) IVPB 2g/100 mL premix        2 g 200 mL/hr over 30 Minutes Intravenous To Surgery 09/16/21 1258 09/18/21 0400   09/17/21 0400  ceFAZolin (ANCEF) IVPB 2g/100 mL premix        2 g 200 mL/hr over 30 Minutes Intravenous To Surgery 09/16/21 1258 09/18/21 0400   09/17/21 0400  fluconazole (DIFLUCAN) IVPB 400 mg        400 mg 100 mL/hr over 120 Minutes Intravenous To Surgery 09/16/21 1258 09/18/21 0400   09/17/21 0400  rifampin (RIFADIN) 600 mg in sodium chloride 0.9 % 100 mL IVPB        600 mg 200  mL/hr over 30 Minutes Intravenous To Surgery 09/16/21 1258 09/18/21 0400   09/17/21 0400  vancomycin (VANCOCIN) powder 1,000 mg        1,000 mg Other To Surgery 09/16/21 1258 09/18/21 0400   09/14/21 2200  ceFEPIme (MAXIPIME) 2 g in sodium chloride 0.9 % 100 mL IVPB        2 g 200 mL/hr over 30 Minutes Intravenous Every 12 hours 09/14/21 1202     09/13/21 1200  vancomycin (VANCOCIN) IVPB 1000 mg/200 mL premix  Status:  Discontinued        1,000 mg 200 mL/hr over 60 Minutes Intravenous Every 24 hours 09/12/21 1152 09/15/21 0856   09/12/21 1400  vancomycin (VANCOREADY) IVPB 1500 mg/300 mL  Status:  Discontinued        1,500 mg 150 mL/hr over 120 Minutes Intravenous Every 48 hours 09/10/21 1120 09/12/21 1148   09/12/21 0845  vancomycin (VANCOCIN) IVPB 1000 mg/200 mL premix        1,000 mg 200 mL/hr over 60 Minutes Intravenous To Surgery 09/12/21 0838 09/12/21 0840   09/11/21 1000  ceFEPIme (MAXIPIME) 2 g in sodium chloride 0.9 % 100 mL IVPB  Status:  Discontinued        2 g 200 mL/hr over 30 Minutes Intravenous Every 24 hours 09/11/21 0740 09/14/21 1202   09/10/21 1100  vancomycin (VANCOREADY) IVPB 1500 mg/300 mL        1,500 mg 150 mL/hr over 120 Minutes Intravenous  Once 09/10/21 0855 09/10/21 1320   09/10/21 1000  ceFEPIme (MAXIPIME) 2 g in sodium chloride 0.9 % 100 mL IVPB  Status:  Discontinued        2 g 200 mL/hr over 30 Minutes Intravenous Every 12 hours 09/10/21 0854 09/11/21 0740        Results for orders placed or performed during the hospital encounter of 09/06/21 (from the past 48 hour(s))  .Cooxemetry Panel (carboxy, met, total hgb, O2 sat)     Status: Abnormal   Collection Time: 09/14/21  7:45 PM  Result Value Ref Range   Total hemoglobin 10.2 (L) 12.0 - 16.0 g/dL   O2 Saturation  53.9 %   Carboxyhemoglobin 2.0 (H) 0.5 - 1.5 %   Methemoglobin 0.9 0.0 - 1.5 %    Comment: Performed at [facility] [address], [place], [place] [address]  .Cooxemetry Panel  (carboxy, met, total hgb, O2 sat)     Status: Abnormal   Collection Time: 09/15/21 12:04 AM  Result Value Ref Range   Total hemoglobin 9.7 (L) 12.0 - 16.0 g/dL   O2 Saturation 61.4 %   Carboxyhemoglobin 2.2 (H) 0.5 - 1.5 %   Methemoglobin 0.9 0.0 - 1.5 %    Comment: Performed at [facility] [address] [place], [place] [address]  .Cooxemetry Panel (carboxy, met, total hgb, O2 sat)     Status: Abnormal   Collection Time: 09/15/21  4:06 AM  Result Value Ref Range   Total hemoglobin 12.6 12.0 - 16.0 g/dL   O2 Saturation 59.1 %   Carboxyhemoglobin 2.2 (H) 0.5 - 1.5 %   Methemoglobin 0.6 0.0 - 1.5 %    Comment: Performed at [facility] [address] [place], [place] [address]  Magnesium     Status: None   Collection Time: 09/15/21  4:08 AM  Result Value Ref Range   Magnesium 1.9 1.7 - 2.4 mg/dL    Comment: Performed at [facility] [address] [place], [place] [address]  Basic metabolic panel     Status: Abnormal   Collection Time: 09/15/21  4:08 AM  Result Value Ref Range   Sodium 126 (L) 135 - 145 mmol/L   Potassium 3.7 3.5 - 5.1 mmol/L   Chloride 93 (L) 98 - 111 mmol/L   CO2 25 22 - 32 mmol/L   Glucose, Bld 105 (H) 70 - 99 mg/dL    Comment: Glucose reference range applies only to samples taken after fasting for at least 8 hours.   BUN 34 (H) 8 - 23 mg/dL   Creatinine, Ser 1.49 (H) 0.61 - 1.24 mg/dL   Calcium 8.5 (L) 8.9 - 10.3 mg/dL   GFR, Estimated 50 (L) >60 mL/min    Comment: (NOTE) Calculated using the CKD-EPI Creatinine Equation (2021)    Anion gap 8 5 - 15    Comment: Performed at [facility] [address], [place], [place] [address]  Lactate dehydrogenase (LDH)     Status: Abnormal   Collection Time: 09/15/21  4:08 AM  Result Value Ref Range   LDH 410 (H) 98 - 192 U/L    Comment: Performed at [facility], [place] [address], [place], [place] [address]  CBC     Status: Abnormal   Collection Time: 09/15/21  4:08 AM  Result  Value Ref Range   WBC 6.7 4.0 - 10.5 K/uL   RBC 3.17 (L) 4.22 - 5.81 MIL/uL   Hemoglobin 9.6 (L) 13.0 - 17.0 g/dL   HCT 27.6 (L) 39.0 - 52.0 %   MCV 87.1 80.0 - 100.0 fL   MCH 30.3 26.0 - 34.0 pg   MCHC 34.8 30.0 - 36.0 g/dL   RDW 16.3 (H) 11.5 - 15.5 %   Platelets 128 (L) 150 - 400 K/uL   nRBC 0.0 0.0 - 0.2 %    Comment: Performed at [facility], [place] [address] [place], [place] [address]  Heparin level (unfractionated)     Status: Abnormal   Collection Time: 09/15/21  4:08 AM  Result Value Ref Range   Heparin Unfractionated <0.10 (L) 0.30 -  0.70 IU/mL    Comment: (NOTE) The clinical reportable range upper limit is being lowered to >1.10 to align with the FDA approved guidance for the current laboratory assay.  If heparin results are below expected values, and patient dosage has  been confirmed, suggest follow up testing of antithrombin III levels. Performed at [facility], [place] [address], [place], [place] [address]   I-STAT 7, (LYTES, BLD GAS, ICA, H+H)     Status: Abnormal   Collection Time: 09/15/21  4:26 AM  Result Value Ref Range   pH, Arterial 7.496 (H) 7.350 - 7.450   pCO2 arterial 34.0 32.0 - 48.0 mmHg   pO2, Arterial 76 (L) 83.0 - 108.0 mmHg   Bicarbonate 26.3 20.0 - 28.0 mmol/L   TCO2 27 22 - 32 mmol/L   O2 Saturation 96.0 %   Acid-Base Excess 3.0 (H) 0.0 - 2.0 mmol/L   Sodium 128 (L) 135 - 145 mmol/L   Potassium 3.8 3.5 - 5.1 mmol/L   Calcium, Ion 1.19 1.15 - 1.40 mmol/L   HCT 31.0 (L) 39.0 - 52.0 %   Hemoglobin 10.5 (L) 13.0 - 17.0 g/dL   Patient temperature 36.8 C    Collection site art line    Drawn by RT    Sample type ARTERIAL   Heparin level (unfractionated)     Status: Abnormal   Collection Time: 09/15/21  8:57 PM  Result Value Ref Range   Heparin Unfractionated <0.10 (L) 0.30 - 0.70 IU/mL    Comment: (NOTE) The clinical reportable range upper limit is being lowered to >1.10 to align with the FDA approved guidance for the current  laboratory assay.  If heparin results are below expected values, and patient dosage has  been confirmed, suggest follow up testing of antithrombin III levels. Performed at [facility], [place] [address], [place], [place] [address]   .Cooxemetry Panel (carboxy, met, total hgb, O2 sat)     Status: Abnormal   Collection Time: 09/16/21  4:15 AM  Result Value Ref Range   Total hemoglobin 10.2 (L) 12.0 - 16.0 g/dL   O2 Saturation 65.2 %   Carboxyhemoglobin 2.4 (H) 0.5 - 1.5 %   Methemoglobin 0.9 0.0 - 1.5 %    Comment: Performed at [facility] [address], [place], [place] [address]  Magnesium     Status: None   Collection Time: 09/16/21  4:15 AM  Result Value Ref Range   Magnesium 2.1 1.7 - 2.4 mg/dL    Comment: Performed at [facility] [address] [place],  [address]  Basic metabolic panel     Status: Abnormal   Collection Time: 09/16/21  4:15 AM  Result Value Ref Range   Sodium 133 (L) 135 - 145 mmol/L   Potassium 4.1 3.5 - 5.1 mmol/L   Chloride 96 (L) 98 - 111 mmol/L   CO2 29 22 - 32 mmol/L   Glucose, Bld 110 (H) 70 - 99 mg/dL    Comment: Glucose reference range applies only to samples taken after fasting for at least 8 hours.   BUN 35 (H) 8 - 23 mg/dL   Creatinine, Ser 1.71 (H) 0.61 - 1.24 mg/dL   Calcium 8.7 (L) 8.9 - 10.3 mg/dL   GFR, Estimated 43 (L) >60 mL/min    Comment: (NOTE) Calculated using the CKD-EPI Creatinine Equation (2021)    Anion gap 8 5 - 15    Comment: Performed at [facility],  [address], [place], [place] [address]  Lactate dehydrogenase (LDH)     Status: Abnormal   Collection Time: 09/16/21  4:15 AM  Result Value Ref Range   LDH 407 (H) 98 - 192 U/L    Comment: Performed at [facility] [address] [place], [place] [address]  CBC     Status: Abnormal   Collection Time: 09/16/21  4:15 AM  Result Value Ref Range   WBC 7.9 4.0 - 10.5 K/uL   RBC 2.93 (L) 4.22 - 5.81 MIL/uL   Hemoglobin 8.7 (L)  13.0 - 17.0 g/dL   HCT 26.1 (L) 39.0 - 52.0 %   MCV 89.1 80.0 - 100.0 fL   MCH 29.7 26.0 - 34.0 pg   MCHC 33.3 30.0 - 36.0 g/dL   RDW 16.5 (H) 11.5 - 15.5 %   Platelets 149 (L) 150 - 400 K/uL   nRBC 0.0 0.0 - 0.2 %    Comment: Performed at [facility] [address], [place], [place] [address]  Heparin level (unfractionated)     Status: Abnormal   Collection Time: 09/16/21  4:15 AM  Result Value Ref Range   Heparin Unfractionated <0.10 (L) 0.30 - 0.70 IU/mL    Comment: (NOTE) The clinical reportable range upper limit is being lowered to >1.10 to align with the FDA approved guidance for the current laboratory assay.  If heparin results are below expected values, and patient dosage has  been confirmed, suggest follow up testing of antithrombin III levels. Performed at [facility], [place] [address], [place], [place] [address]   I-STAT 7, (LYTES, BLD GAS, ICA, H+H)     Status: Abnormal   Collection Time: 09/16/21  5:15 AM  Result Value Ref Range   pH, Arterial 7.488 (H) 7.350 - 7.450   pCO2 arterial 40.2 32.0 - 48.0 mmHg   pO2, Arterial 67 (L) 83.0 - 108.0 mmHg   Bicarbonate 30.5 (H) 20.0 - 28.0 mmol/L   TCO2 32 22 - 32 mmol/L   O2 Saturation 94.0 %   Acid-Base Excess 7.0 (H) 0.0 - 2.0 mmol/L   Sodium 135 135 - 145 mmol/L   Potassium 4.2 3.5 - 5.1 mmol/L   Calcium, Ion 1.26 1.15 - 1.40 mmol/L   HCT 31.0 (L) 39.0 - 52.0 %   Hemoglobin 10.5 (L) 13.0 - 17.0 g/dL   Sample type ARTERIAL   Heparin level (unfractionated)     Status: Abnormal   Collection Time: 09/16/21  2:42 PM  Result Value Ref Range   Heparin Unfractionated <0.10 (L) 0.30 - 0.70 IU/mL    Comment: (NOTE) The clinical reportable range upper limit is being lowered to >1.10 to align with the FDA approved guidance for the current laboratory assay.  If heparin results are below expected values, and patient dosage has  been confirmed, suggest follow up testing of antithrombin III levels. Performed at  [facility], [place] [address] [place], [place] [address]     ECHOCARDIOGRAM LIMITED  Result Date: 09/16/2021    ECHOCARDIOGRAM LIMITED REPORT   Patient Name:   Isaac [name] Date of Exam: 09/16/2021 Medical Rec #:  [id number]           Height:       69.0 in Accession #:    [id number]          Weight:       170.9 lb Date of Birth:  1951-07-16  BSA:          1.932 m Patient Age:    51 years            BP:           93/75 mmHg Patient Gender: M                   HR:           95 bpm. Exam Location:  Inpatient Procedure: Limited Echo Indications:    impella placement check  History:        Patient has prior history of Echocardiogram examinations, most                 recent 08/23/2021.  Sonographer:    Johny Chess RDCS Referring Phys: [place]  1. Limited echo for impella posiition. Device approximately 4.95 cm distal to the aortic annulus.  2. Left ventricular ejection fraction, by estimation, is 20 to 25%. The left ventricle has severely decreased function. The left ventricle demonstrates global hypokinesis. The left ventricular internal cavity size was moderately dilated.  3. Left atrial size was moderately dilated.  4. Right atrial size was moderately dilated. FINDINGS  Left Ventricle: Left ventricular ejection fraction, by estimation, is 20 to 25%. The left ventricle has severely decreased function. The left ventricle demonstrates global hypokinesis. The left ventricular internal cavity size was moderately dilated. Left Atrium: Left atrial size was moderately dilated. Right Atrium: Right atrial size was moderately dilated. Pericardium: There is no evidence of pericardial effusion. Additional Comments: Limited echo for impella posiition. Device approximately 4.95 cm distal to the aortic annulus. Jenkins Rouge MD Electronically signed by Jenkins Rouge MD Signature Date/Time: 09/16/2021/12:04:02 PM    Final     Review of Systems  Constitutional:  Positive for  activity change and fatigue. Negative for appetite change, chills and fever.  HENT: Negative.    Eyes: Negative.   Respiratory:  Positive for shortness of breath.   Cardiovascular:  Positive for leg swelling. Negative for chest pain.  Gastrointestinal: Negative.   Endocrine: Negative.   Genitourinary: Negative.   Musculoskeletal: Negative.   Skin: Negative.   Allergic/Immunologic: Negative.   Neurological:  Positive for dizziness and syncope.  Hematological: Negative.   Psychiatric/Behavioral: Negative.    Blood pressure 109/88, pulse 97, temperature 98.4 F (36.9 C), resp. rate 17, height _0  (1.753 m), weight 77.5 kg, SpO2 98 %. Physical Exam Constitutional:      Appearance: He is well-developed and normal weight.  HENT:     Head: Normocephalic and atraumatic.     Mouth/Throat:     Mouth: Mucous membranes are moist.     Pharynx: Oropharynx is clear.  Eyes:     Extraocular Movements: Extraocular movements intact.     Pupils: Pupils are equal, round, and reactive to light.  Neck:     Vascular: No carotid bruit.  Cardiovascular:     Rate and Rhythm: Normal rate. Rhythm irregular.     Pulses: Normal pulses.     Heart sounds: No murmur heard. Pulmonary:     Effort: Pulmonary effort is normal.     Breath sounds: Normal breath sounds.  Abdominal:     General: Abdomen is flat. Bowel sounds are normal. There is no distension.     Palpations: Abdomen is soft.     Tenderness: There is no abdominal tenderness.  Musculoskeletal:        General: No swelling.  Cervical back: Normal range of motion and neck supple.  Skin:    General: Skin is warm and dry.  Neurological:     General: No focal deficit present.     Mental Status: He is alert and oriented to person, place, and time.  Psychiatric:        Mood and Affect: Mood normal.        Behavior: Behavior normal.    Assessment/Plan:  This 70 year old gentleman has longstanding cardiomyopathy of unclear etiology with  admission for acute on chronic systolic congestive heart failure with an echocardiogram showing an ejection fraction less than 20% with severe left ventricular dilation.  There is also moderate RV systolic dysfunction with moderate MR and moderate to severe TR.  He presented with NYHA class IV symptoms with a Co-ox of 36% off milrinone.  Despite increasing milrinone to 0.375 his cardiac output remained low.  He has stabilized since insertion of an Impella 5.5 with a Co-ox of 65% and a cardiac index of 2.6 on milrinone 0.5 and the Impella at P8.  His renal function has improved and he has been diuresing well.  I agree that left ventricular assist device insertion as destination therapy is the best treatment for this patient.  He is tuned up as well as he is going to be.  We will need to evaluate his tricuspid regurgitation in the operating room to decide if tricuspid valve repair is indicated.  He does have moderate RV systolic dysfunction although he has been maintaining relatively low CVP with normal PA pressures and wedge. I discussed the operative procedure with the patient and his wife including alternatives, benefits and risks; including but not limited to bleeding, blood transfusion, infection, stroke, myocardial infarction, pump failure, pump thrombosis, GI bleeding, organ dysfunction, and death.  Isaac [name] understands and agrees to proceed.  We will schedule surgery for tomorrow morning.  Isaac [name] 09/16/2021, 6:52 PM

## 2021-09-16 NOTE — Progress Notes (Addendum)
Isaac [name] has been discussed with the VAD Medical Review board on 09/16/21. The team feels as if the patient is a good candidate for Destination LVAD therapy due to age and kidney disease. The patient meets criteria for a LVAD implant as listed below:  1) NYHA Class: _IV___ documented on __12/27/22_(date)  2) Has a left ventricular ejection fraction (LVEF) < 25%   *EF__<20%____ by echo (date) TEE 09/12/21 _____  3) Must meet one of the following:   Is inotrope dependent   *On inotropes: Milrinone_0.49mg/kg/min__started_12/18/22_  OR  Has a Cardiac Index (CI) < 2.2  L/min/m2 while not on inotropes:   *CI:______   4) Must meet one of the following:   ______ Is on optimal medical management (OMM), based on current heart failure practice guidelines for at least 457of the last 60 days and are failing to respond   __X____ Has advanced heart failure for at least 14 days and are dependent on an intra-aortic balloon pump (IABP) or similar temporary mechanical circulatory support for at least 7 days      ____ IABP inserted (date) ____      ___X_ Impella inserted (date) _12/23/22___  5)  Social work and palliative care evaluations demonstrate appropriate support system in place for discharge to home with a VAD and that end of life discussions have taken place. Both services have expressed no concern regarding patient's candidacy.         *Social work consult (date): 09/11/21        *Palliative Care Consult (date): 09/10/21  6)  Primary caretaker identified that can be taught along with the patient how to manage        the VAD equipment.        *Name: DEarlie Server(wife)  7)  Deemed appropriate by our financial coordinator: DCecilio Asper       Prior approval: spoke with RRod [place] procedure covered under hospital authorization # [id number]  8  VAD Coordinator, MZada Girthas met with patient and caregiver, shown them the VAD equipment and discussed with the patient and  caregiver about lifestyle changes necessary for success on mechanical circulatory device.        *Met with patient and his wife on 09/09/21.       *Consent for VAD Evaluation/Caregiver Agreement/HIPPA Release/Photo Release signed on 09/09/21   9)  Six Minute Walk:  unable to complete with swan in place   10) KCCQ Pre VAD:  [facility]  KCCQ-12    1 a. Ability to shower/bathe Slightly limited   1 b. Ability to walk 1 block Slightly limited   1 c. Ability to hurry/jog Did not do   2. Edema feet/ankles/legs Every morning   3. Limited by fatigue Several times per day   4. Limited by dyspnea Several times per day   5. Sitting up / on 3+ pillows Every night   6. Limited enjoyment of life Extremely limited   7. Rest of life w/ symptoms Not at all satisfied   8 a. Participation in hobbies Severely limited   8 b. Participation in chores Severely limited   8 c. Visiting family/friends Severely limited      11)  Intermacs profile: 1  INTERMACS 1: Critical cardiogenic shock describes a patient who is crashing and burning, in which a patient has life-threatening hypotension and rapidly escalating inotropic pressor support, with critical organ hypoperfusion often confirmed by worsening acidosis and lactate levels.  INTERMACS 2: Progressive decline  describes a patient who has been demonstrated dependent on inotropic support but nonetheless shows signs of continuing deterioration in nutrition, renal function, fluid retention, or other major status indicator. Patient profile 2 can also describe a patient with refractory volume overload, perhaps with evidence of impaired perfusion, in whom inotropic infusions cannot be maintained due to tachyarrhythmias, clinical ischemia, or other intolerance.  INTERMACS 3: Stable but inotrope dependent describes a patient who is clinically stable on mild-moderate doses of intravenous inotropes (or has a temporary circulatory  support device) after repeated documentation of failure to wean without symptomatic hypotension, worsening symptoms, or progressive organ dysfunction (usually renal). It is critical to monitor nutrition, renal function, fluid balance, and overall status carefully in order to distinguish between a   patient who is truly stable at Patient Profile 3 and a patient who has unappreciated decline rendering them Patient Profile 2. This patient may be either at home or in the hospital.      INTERMACS 4: Resting symptoms describes a patient who is at home on oral therapy but frequently has symptoms of congestion at rest or with activities of daily living (ADL). He or she may have orthopnea, shortness of breath during ADL such as dressing or bathing, gastrointestinal symptoms (abdominal discomfort, nausea, poor appetite), disabling ascites or severe lower extremity edema. This patient should be carefully considered for more intensive management and surveillance programs, which may in some cases, reveal poor compliance that would compromise outcomes with any therapy.   .   INTERMACS 5: Exertion Intolerant describes a patient who is comfortable at rest but unable to engage in any activity, living predominantly within the house or housebound. This patient has no congestive symptoms, but may have chronically elevated volume status, frequently with renal dysfunction, and may be characterized as exercise intolerant.      INTERMACS 6: Exertion Limited also describes a patient who is comfortable at rest without evidence of fluid overload, but who is able to do some mild activity. Activities of daily living are comfortable and minor activities outside the home such as visiting friends or going to a restaurant can be performed, but fatigue results within a few minutes of any meaningful physical exertion. This patient has occasional episodes of worsening symptoms and is likely to have had a hospitalization for heart failure  within the past year.   INTERMACS 7: Advanced NYHA Class 3 describes a patient who is clinically stable with a reasonable level of comfortable activity, despite history of previous decompensation that is not recent. This patient is usually able to walk more than a block. Any decompensation requiring intravenous diuretics or hospitalization within the previous month should make this person a Patient Profile 6 or lower.     Emerson Monte RN [place] Coordinator  Office: [phone]  24/7 Pager: [phone]

## 2021-09-16 NOTE — Plan of Care (Signed)
  Problem: Health Behavior/Discharge Planning: Goal: Ability to manage health-related needs will improve Outcome: Progressing   Problem: Clinical Measurements: Goal: Ability to maintain clinical measurements within normal limits will improve Outcome: Progressing Goal: Will remain free from infection Outcome: Progressing Goal: Diagnostic test results will improve Outcome: Progressing Goal: Respiratory complications will improve Outcome: Progressing Goal: Cardiovascular complication will be avoided Outcome: Progressing   Problem: Activity: Goal: Risk for activity intolerance will decrease Outcome: Progressing   Problem: Nutrition: Goal: Adequate nutrition will be maintained Outcome: Progressing   Problem: Coping: Goal: Level of anxiety will decrease Outcome: Progressing   Problem: Elimination: Goal: Will not experience complications related to bowel motility Outcome: Progressing Goal: Will not experience complications related to urinary retention Outcome: Progressing   Problem: Pain Managment: Goal: General experience of comfort will improve Outcome: Progressing   Problem: Safety: Goal: Ability to remain free from injury will improve Outcome: Progressing   Problem: Skin Integrity: Goal: Risk for impaired skin integrity will decrease Outcome: Progressing   Problem: Education: Goal: Ability to demonstrate management of disease process will improve Outcome: Progressing Goal: Ability to verbalize understanding of medication therapies will improve Outcome: Progressing Goal: Individualized Educational Video(s) Outcome: Progressing   Problem: Activity: Goal: Capacity to carry out activities will improve Outcome: Progressing   Problem: Cardiac: Goal: Ability to achieve and maintain adequate cardiopulmonary perfusion will improve Outcome: Progressing   

## 2021-09-16 NOTE — Progress Notes (Addendum)
ANTICOAGULATION CONSULT NOTE   Pharmacy Consult for Heparin Indication: atrial fibrillation, impella 5.5  Allergies  Allergen Reactions   Mushroom Extract Complex Nausea And Vomiting   Neosporin [Neomycin-Bacitracin Zn-Polymyx] Hives    Patient Measurements: Height: 5\' 9"  (175.3 cm) Weight: 77.5 kg (170 lb 13.7 oz) IBW/kg (Calculated) : 70.7 Heparin Dosing Weight: 81.6 kg  Vital Signs: Temp: 98.4 F (36.9 C) (12/27 1500) Temp Source: Oral (12/27 0746) BP: 125/79 (12/27 1500) Pulse Rate: 102 (12/27 1500)  Labs: Recent Labs    09/14/21 0420 09/15/21 0408 09/15/21 0426 09/15/21 2057 09/16/21 0415 09/16/21 0515 09/16/21 1442  HGB 10.7* 9.6* 10.5*  --  8.7* 10.5*  --   HCT 30.8* 27.6* 31.0*  --  26.1* 31.0*  --   PLT 158 128*  --   --  149*  --   --   HEPARINUNFRC <0.10* <0.10*  --  <0.10* <0.10*  --  <0.10*  CREATININE 1.70* 1.49*  --   --  1.71*  --   --      Estimated Creatinine Clearance: 40.8 mL/min (A) (by C-G formula based on SCr of 1.71 mg/dL (H)).   Medical History: Past Medical History:  Diagnosis Date   Arrhythmia    atrial fibrillation   CHF (congestive heart failure) (HCC)    Chronic kidney disease    Coronary artery disease    Hyperlipidemia    Hypertension    Myocardial infarct (HCC)     Medications:  Medications Prior to Admission  Medication Sig Dispense Refill Last Dose   albuterol (VENTOLIN HFA) 108 (90 Base) MCG/ACT inhaler Inhale 2 puffs into the lungs every 6 (six) hours as needed.   unknown   apixaban (ELIQUIS) 5 MG TABS tablet Take 1 tablet (5 mg total) by mouth 2 (two) times daily. 60 tablet 2 09/06/2021 at 0500   furosemide (LASIX) 20 MG tablet Take 1 tablet (20 mg total) by mouth daily as needed for edema or fluid (sob). (Patient taking differently: Take 20 mg by mouth daily.) 90 tablet 0 Past Week   midodrine (PROAMATINE) 2.5 MG tablet Take 1 tablet (2.5 mg total) by mouth 3 (three) times daily with meals. 90 tablet 2 09/06/2021    amiodarone (PACERONE) 200 MG tablet Take 1 tablet (200 mg total) by mouth 2 (two) times daily. (Patient not taking: Reported on 08/29/2021) 60 tablet 2 Not Taking   metoprolol tartrate (LOPRESSOR) 25 MG tablet Take 0.5 tablets (12.5 mg total) by mouth 2 (two) times daily. (Patient not taking: Reported on 08/29/2021) 30 tablet 2 Not Taking   montelukast (SINGULAIR) 10 MG tablet Take by mouth. (Patient not taking: Reported on 09/06/2021)   Not Taking   spironolactone (ALDACTONE) 25 MG tablet Take 0.5 tablets (12.5 mg total) by mouth daily. (Patient not taking: Reported on 08/29/2021) 15 tablet 2 Not Taking   tamsulosin (FLOMAX) 0.4 MG CAPS capsule TAKE 1 CAPSULE (0.4 MG TOTAL) BY MOUTH DAILY AFTER BREAKFAST. (Patient not taking: Reported on 08/29/2021) 30 capsule 0 Not Taking    Scheduled:   Chlorhexidine Gluconate Cloth  6 each Topical Daily   [START ON 09/17/2021] epinephrine  0-10 mcg/min Intravenous To OR   feeding supplement  237 mL Oral TID BM   [START ON 09/17/2021] insulin   Intravenous To OR   [START ON 09/17/2021] [place] Blood Cardioplegia vial (lidocaine/magnesium/mannitol 0.26g-4g-6.4g)   Intracoronary To OR   montelukast  5 mg Oral QHS   multivitamin with minerals  1 tablet Oral Daily   [  START ON 09/17/2021] phenylephrine  0-100 mcg/min Intravenous To OR   [START ON 09/17/2021] potassium chloride  80 mEq Other To OR   rosuvastatin  10 mg Oral Daily   senna-docusate  2 tablet Oral QHS   sodium chloride flush  10-40 mL Intracatheter Q12H   sodium chloride flush  3 mL Intravenous Q12H   sodium chloride flush  3 mL Intravenous Q12H   [START ON 09/17/2021] tranexamic acid  15 mg/kg (Adjusted) Intravenous To OR   [START ON 09/17/2021] tranexamic acid  2 mg/kg Intracatheter To OR   [START ON 09/17/2021] vancomycin  1,000 mg Other To OR   [START ON 09/17/2021] vasopressin  0.04 Units/min Intravenous To OR    Assessment: 68 yom with a history of stage of systolic CHF chronic LVEF  less than 20%, status post AICD/PPM, PAF on Eliquis, CKD stage IIIb, HTN, HLD. Patient presented with weakness, near syncope, and poor oral intake. Heparin per pharmacy consult placed for atrial fibrillation in preparation for RHC > impella and LVAD work up  [place] placed 12/23. Systemic heparin was stopped for implant and not resumed after with oozing at insertion site and slight hematuria - now resolved. With up trending LDH, cardiology would like systemic heparin started this morning. Patient had low systemic requirements prior to impella so will follow rate adjustments closely.   Heparin purge (50 units/mL concentration) - Purge flow 8.7 mL/hr (435 units/hr of heparin); purge pressures stable 400-500s. Heparin level < 0.1 less than goal. Hgb stable 10s, pltc trending up 149, LDH stable in 400s. Hematoma stable, oozing remains the same at impella site - no worsening of bleeding.  Goal of Therapy:  Heparin level 0.3-0.5 units/ml Monitor platelets by anticoagulation protocol: Yes   Plan:  Increase systemic heparin at 500 units/hr -Titrate rate slowly to achieve goal Continue heparin purge per pump Monitor for s/sx of bleeding  Antonietta Jewel, PharmD, [place] Pharmacist  Phone: [phone] 09/16/2021 3:16 PM  Please check AMION for all [place] phone numbers After 10:00 PM, call [place] [phone]

## 2021-09-17 ENCOUNTER — Inpatient Hospital Stay (HOSPITAL_COMMUNITY)
Admission: EM | Disposition: A | Discharge status: Home Health | Payer: Self-pay | Source: Home / Self Care | Attending: Surgery

## 2021-09-17 ENCOUNTER — Encounter (HOSPITAL_COMMUNITY): Payer: Self-pay | Admitting: Internal Medicine

## 2021-09-17 ENCOUNTER — Inpatient Hospital Stay (HOSPITAL_COMMUNITY): Payer: Medicare HMO

## 2021-09-17 ENCOUNTER — Inpatient Hospital Stay (HOSPITAL_COMMUNITY): Payer: Medicare HMO | Admitting: Critical Care Medicine

## 2021-09-17 DIAGNOSIS — Z95811 Presence of heart assist device: Secondary | ICD-10-CM

## 2021-09-17 DIAGNOSIS — I5043 Acute on chronic combined systolic (congestive) and diastolic (congestive) heart failure: Secondary | ICD-10-CM

## 2021-09-17 DIAGNOSIS — I509 Heart failure, unspecified: Secondary | ICD-10-CM | POA: Diagnosis not present

## 2021-09-17 DIAGNOSIS — R570 Cardiogenic shock: Secondary | ICD-10-CM | POA: Diagnosis not present

## 2021-09-17 HISTORY — PX: INSERTION OF IMPLANTABLE LEFT VENTRICULAR ASSIST DEVICE: SHX5866

## 2021-09-17 HISTORY — PX: TEE WITHOUT CARDIOVERSION: SHX5443

## 2021-09-17 LAB — POCT I-STAT 7, (LYTES, BLD GAS, ICA,H+H)
Acid-Base Excess: 6 mmol/L — ABNORMAL HIGH (ref 0.0–2.0)
Acid-Base Excess: 6 mmol/L — ABNORMAL HIGH (ref 0.0–2.0)
Acid-Base Excess: 5 mmol/L — ABNORMAL HIGH (ref 0.0–2.0)
Acid-Base Excess: 4 mmol/L — ABNORMAL HIGH (ref 0.0–2.0)
Acid-Base Excess: 2 mmol/L (ref 0.0–2.0)
Acid-Base Excess: 2 mmol/L (ref 0.0–2.0)
Acid-Base Excess: 0 mmol/L (ref 0.0–2.0)
Acid-base deficit: 1 mmol/L (ref 0.0–2.0)
Acid-base deficit: 2 mmol/L (ref 0.0–2.0)
Acid-base deficit: 4 mmol/L — ABNORMAL HIGH (ref 0.0–2.0)
Bicarbonate: 30.3 mmol/L — ABNORMAL HIGH (ref 20.0–28.0)
Bicarbonate: 31.8 mmol/L — ABNORMAL HIGH (ref 20.0–28.0)
Bicarbonate: 29.4 mmol/L — ABNORMAL HIGH (ref 20.0–28.0)
Bicarbonate: 28 mmol/L (ref 20.0–28.0)
Bicarbonate: 27.6 mmol/L (ref 20.0–28.0)
Bicarbonate: 28 mmol/L (ref 20.0–28.0)
Bicarbonate: 24.9 mmol/L (ref 20.0–28.0)
Bicarbonate: 25.2 mmol/L (ref 20.0–28.0)
Bicarbonate: 25.3 mmol/L (ref 20.0–28.0)
Bicarbonate: 23.1 mmol/L (ref 20.0–28.0)
Calcium, Ion: 1.23 mmol/L (ref 1.15–1.40)
Calcium, Ion: 1.27 mmol/L (ref 1.15–1.40)
Calcium, Ion: 1.09 mmol/L — ABNORMAL LOW (ref 1.15–1.40)
Calcium, Ion: 1.1 mmol/L — ABNORMAL LOW (ref 1.15–1.40)
Calcium, Ion: 1.02 mmol/L — ABNORMAL LOW (ref 1.15–1.40)
Calcium, Ion: 1.14 mmol/L — ABNORMAL LOW (ref 1.15–1.40)
Calcium, Ion: 1.06 mmol/L — ABNORMAL LOW (ref 1.15–1.40)
Calcium, Ion: 1.46 mmol/L — ABNORMAL HIGH (ref 1.15–1.40)
Calcium, Ion: 1.3 mmol/L (ref 1.15–1.40)
Calcium, Ion: 1.2 mmol/L (ref 1.15–1.40)
HCT: 33 % — ABNORMAL LOW (ref 39.0–52.0)
HCT: 29 % — ABNORMAL LOW (ref 39.0–52.0)
HCT: 24 % — ABNORMAL LOW (ref 39.0–52.0)
HCT: 24 % — ABNORMAL LOW (ref 39.0–52.0)
HCT: 21 % — ABNORMAL LOW (ref 39.0–52.0)
HCT: 22 % — ABNORMAL LOW (ref 39.0–52.0)
HCT: 21 % — ABNORMAL LOW (ref 39.0–52.0)
HCT: 26 % — ABNORMAL LOW (ref 39.0–52.0)
HCT: 31 % — ABNORMAL LOW (ref 39.0–52.0)
HCT: 22 % — ABNORMAL LOW (ref 39.0–52.0)
Hemoglobin: 11.2 g/dL — ABNORMAL LOW (ref 13.0–17.0)
Hemoglobin: 9.9 g/dL — ABNORMAL LOW (ref 13.0–17.0)
Hemoglobin: 8.2 g/dL — ABNORMAL LOW (ref 13.0–17.0)
Hemoglobin: 8.2 g/dL — ABNORMAL LOW (ref 13.0–17.0)
Hemoglobin: 7.1 g/dL — ABNORMAL LOW (ref 13.0–17.0)
Hemoglobin: 7.5 g/dL — ABNORMAL LOW (ref 13.0–17.0)
Hemoglobin: 7.1 g/dL — ABNORMAL LOW (ref 13.0–17.0)
Hemoglobin: 8.8 g/dL — ABNORMAL LOW (ref 13.0–17.0)
Hemoglobin: 10.5 g/dL — ABNORMAL LOW (ref 13.0–17.0)
Hemoglobin: 7.5 g/dL — ABNORMAL LOW (ref 13.0–17.0)
O2 Saturation: 95 %
O2 Saturation: 99 %
O2 Saturation: 100 %
O2 Saturation: 100 %
O2 Saturation: 100 %
O2 Saturation: 100 %
O2 Saturation: 100 %
O2 Saturation: 99 %
O2 Saturation: 98 %
O2 Saturation: 98 %
Patient temperature: 37
Patient temperature: 36
Potassium: 4.3 mmol/L (ref 3.5–5.1)
Potassium: 4.2 mmol/L (ref 3.5–5.1)
Potassium: 4.1 mmol/L (ref 3.5–5.1)
Potassium: 4.2 mmol/L (ref 3.5–5.1)
Potassium: 4.4 mmol/L (ref 3.5–5.1)
Potassium: 4.5 mmol/L (ref 3.5–5.1)
Potassium: 3.8 mmol/L (ref 3.5–5.1)
Potassium: 4.2 mmol/L (ref 3.5–5.1)
Potassium: 3.6 mmol/L (ref 3.5–5.1)
Potassium: 3.9 mmol/L (ref 3.5–5.1)
Sodium: 135 mmol/L (ref 135–145)
Sodium: 135 mmol/L (ref 135–145)
Sodium: 135 mmol/L (ref 135–145)
Sodium: 134 mmol/L — ABNORMAL LOW (ref 135–145)
Sodium: 133 mmol/L — ABNORMAL LOW (ref 135–145)
Sodium: 134 mmol/L — ABNORMAL LOW (ref 135–145)
Sodium: 133 mmol/L — ABNORMAL LOW (ref 135–145)
Sodium: 134 mmol/L — ABNORMAL LOW (ref 135–145)
Sodium: 134 mmol/L — ABNORMAL LOW (ref 135–145)
Sodium: 136 mmol/L (ref 135–145)
TCO2: 31 mmol/L (ref 22–32)
TCO2: 33 mmol/L — ABNORMAL HIGH (ref 22–32)
TCO2: 31 mmol/L (ref 22–32)
TCO2: 29 mmol/L (ref 22–32)
TCO2: 29 mmol/L (ref 22–32)
TCO2: 29 mmol/L (ref 22–32)
TCO2: 26 mmol/L (ref 22–32)
TCO2: 26 mmol/L (ref 22–32)
TCO2: 27 mmol/L (ref 22–32)
TCO2: 25 mmol/L (ref 22–32)
pCO2 arterial: 40.1 mmHg (ref 32.0–48.0)
pCO2 arterial: 49.2 mmHg — ABNORMAL HIGH (ref 32.0–48.0)
pCO2 arterial: 42.9 mmHg (ref 32.0–48.0)
pCO2 arterial: 41.3 mmHg (ref 32.0–48.0)
pCO2 arterial: 45.2 mmHg (ref 32.0–48.0)
pCO2 arterial: 49.9 mmHg — ABNORMAL HIGH (ref 32.0–48.0)
pCO2 arterial: 40.2 mmHg (ref 32.0–48.0)
pCO2 arterial: 46.1 mmHg (ref 32.0–48.0)
pCO2 arterial: 56.2 mmHg — ABNORMAL HIGH (ref 32.0–48.0)
pCO2 arterial: 49.7 mmHg — ABNORMAL HIGH (ref 32.0–48.0)
pH, Arterial: 7.485 — ABNORMAL HIGH (ref 7.350–7.450)
pH, Arterial: 7.419 (ref 7.350–7.450)
pH, Arterial: 7.444 (ref 7.350–7.450)
pH, Arterial: 7.44 (ref 7.350–7.450)
pH, Arterial: 7.357 (ref 7.350–7.450)
pH, Arterial: 7.394 (ref 7.350–7.450)
pH, Arterial: 7.341 — ABNORMAL LOW (ref 7.350–7.450)
pH, Arterial: 7.405 (ref 7.350–7.450)
pH, Arterial: 7.261 — ABNORMAL LOW (ref 7.350–7.450)
pH, Arterial: 7.27 — ABNORMAL LOW (ref 7.350–7.450)
pO2, Arterial: 70 mmHg — ABNORMAL LOW (ref 83.0–108.0)
pO2, Arterial: 141 mmHg — ABNORMAL HIGH (ref 83.0–108.0)
pO2, Arterial: 290 mmHg — ABNORMAL HIGH (ref 83.0–108.0)
pO2, Arterial: 270 mmHg — ABNORMAL HIGH (ref 83.0–108.0)
pO2, Arterial: 263 mmHg — ABNORMAL HIGH (ref 83.0–108.0)
pO2, Arterial: 309 mmHg — ABNORMAL HIGH (ref 83.0–108.0)
pO2, Arterial: 139 mmHg — ABNORMAL HIGH (ref 83.0–108.0)
pO2, Arterial: 248 mmHg — ABNORMAL HIGH (ref 83.0–108.0)
pO2, Arterial: 130 mmHg — ABNORMAL HIGH (ref 83.0–108.0)
pO2, Arterial: 111 mmHg — ABNORMAL HIGH (ref 83.0–108.0)

## 2021-09-17 LAB — CBC
HCT: 29.4 % — ABNORMAL LOW (ref 39.0–52.0)
HCT: 22.9 % — ABNORMAL LOW (ref 39.0–52.0)
Hemoglobin: 9.7 g/dL — ABNORMAL LOW (ref 13.0–17.0)
Hemoglobin: 7.6 g/dL — ABNORMAL LOW (ref 13.0–17.0)
MCH: 29.7 pg (ref 26.0–34.0)
MCH: 30.2 pg (ref 26.0–34.0)
MCHC: 33 g/dL (ref 30.0–36.0)
MCHC: 33.2 g/dL (ref 30.0–36.0)
MCV: 89.9 fL (ref 80.0–100.0)
MCV: 90.9 fL (ref 80.0–100.0)
Platelets: 173 10*3/uL (ref 150–400)
Platelets: 82 10*3/uL — ABNORMAL LOW (ref 150–400)
RBC: 3.27 MIL/uL — ABNORMAL LOW (ref 4.22–5.81)
RBC: 2.52 MIL/uL — ABNORMAL LOW (ref 4.22–5.81)
RDW: 17.1 % — ABNORMAL HIGH (ref 11.5–15.5)
RDW: 17.3 % — ABNORMAL HIGH (ref 11.5–15.5)
WBC: 8.1 10*3/uL (ref 4.0–10.5)
WBC: 17 10*3/uL — ABNORMAL HIGH (ref 4.0–10.5)
nRBC: 0 % (ref 0.0–0.2)
nRBC: 0.1 % (ref 0.0–0.2)

## 2021-09-17 LAB — POCT I-STAT, CHEM 8
BUN: 31 mg/dL — ABNORMAL HIGH (ref 8–23)
BUN: 30 mg/dL — ABNORMAL HIGH (ref 8–23)
BUN: 29 mg/dL — ABNORMAL HIGH (ref 8–23)
BUN: 32 mg/dL — ABNORMAL HIGH (ref 8–23)
BUN: 30 mg/dL — ABNORMAL HIGH (ref 8–23)
Calcium, Ion: 1.25 mmol/L (ref 1.15–1.40)
Calcium, Ion: 1.15 mmol/L (ref 1.15–1.40)
Calcium, Ion: 1.09 mmol/L — ABNORMAL LOW (ref 1.15–1.40)
Calcium, Ion: 1.02 mmol/L — ABNORMAL LOW (ref 1.15–1.40)
Calcium, Ion: 1.44 mmol/L — ABNORMAL HIGH (ref 1.15–1.40)
Chloride: 97 mmol/L — ABNORMAL LOW (ref 98–111)
Chloride: 98 mmol/L (ref 98–111)
Chloride: 96 mmol/L — ABNORMAL LOW (ref 98–111)
Chloride: 94 mmol/L — ABNORMAL LOW (ref 98–111)
Chloride: 97 mmol/L — ABNORMAL LOW (ref 98–111)
Creatinine, Ser: 1.6 mg/dL — ABNORMAL HIGH (ref 0.61–1.24)
Creatinine, Ser: 1.7 mg/dL — ABNORMAL HIGH (ref 0.61–1.24)
Creatinine, Ser: 1.6 mg/dL — ABNORMAL HIGH (ref 0.61–1.24)
Creatinine, Ser: 1.5 mg/dL — ABNORMAL HIGH (ref 0.61–1.24)
Creatinine, Ser: 1.6 mg/dL — ABNORMAL HIGH (ref 0.61–1.24)
Glucose, Bld: 108 mg/dL — ABNORMAL HIGH (ref 70–99)
Glucose, Bld: 157 mg/dL — ABNORMAL HIGH (ref 70–99)
Glucose, Bld: 198 mg/dL — ABNORMAL HIGH (ref 70–99)
Glucose, Bld: 234 mg/dL — ABNORMAL HIGH (ref 70–99)
Glucose, Bld: 172 mg/dL — ABNORMAL HIGH (ref 70–99)
HCT: 30 % — ABNORMAL LOW (ref 39.0–52.0)
HCT: 30 % — ABNORMAL LOW (ref 39.0–52.0)
HCT: 25 % — ABNORMAL LOW (ref 39.0–52.0)
HCT: 22 % — ABNORMAL LOW (ref 39.0–52.0)
HCT: 26 % — ABNORMAL LOW (ref 39.0–52.0)
Hemoglobin: 10.2 g/dL — ABNORMAL LOW (ref 13.0–17.0)
Hemoglobin: 10.2 g/dL — ABNORMAL LOW (ref 13.0–17.0)
Hemoglobin: 8.5 g/dL — ABNORMAL LOW (ref 13.0–17.0)
Hemoglobin: 7.5 g/dL — ABNORMAL LOW (ref 13.0–17.0)
Hemoglobin: 8.8 g/dL — ABNORMAL LOW (ref 13.0–17.0)
Potassium: 4.1 mmol/L (ref 3.5–5.1)
Potassium: 4.1 mmol/L (ref 3.5–5.1)
Potassium: 4.2 mmol/L (ref 3.5–5.1)
Potassium: 4.4 mmol/L (ref 3.5–5.1)
Potassium: 3.8 mmol/L (ref 3.5–5.1)
Sodium: 135 mmol/L (ref 135–145)
Sodium: 135 mmol/L (ref 135–145)
Sodium: 134 mmol/L — ABNORMAL LOW (ref 135–145)
Sodium: 133 mmol/L — ABNORMAL LOW (ref 135–145)
Sodium: 134 mmol/L — ABNORMAL LOW (ref 135–145)
TCO2: 30 mmol/L (ref 22–32)
TCO2: 28 mmol/L (ref 22–32)
TCO2: 28 mmol/L (ref 22–32)
TCO2: 27 mmol/L (ref 22–32)
TCO2: 25 mmol/L (ref 22–32)

## 2021-09-17 LAB — GLUCOSE, CAPILLARY
Glucose-Capillary: 112 mg/dL — ABNORMAL HIGH (ref 70–99)
Glucose-Capillary: 65 mg/dL — ABNORMAL LOW (ref 70–99)
Glucose-Capillary: 118 mg/dL — ABNORMAL HIGH (ref 70–99)
Glucose-Capillary: 94 mg/dL (ref 70–99)
Glucose-Capillary: 88 mg/dL (ref 70–99)
Glucose-Capillary: 105 mg/dL — ABNORMAL HIGH (ref 70–99)
Glucose-Capillary: 115 mg/dL — ABNORMAL HIGH (ref 70–99)
Glucose-Capillary: 100 mg/dL — ABNORMAL HIGH (ref 70–99)

## 2021-09-17 LAB — BASIC METABOLIC PANEL
Anion gap: 10 (ref 5–15)
Anion gap: 8 (ref 5–15)
BUN: 34 mg/dL — ABNORMAL HIGH (ref 8–23)
BUN: 29 mg/dL — ABNORMAL HIGH (ref 8–23)
CO2: 29 mmol/L (ref 22–32)
CO2: 27 mmol/L (ref 22–32)
Calcium: 9 mg/dL (ref 8.9–10.3)
Calcium: 8.4 mg/dL — ABNORMAL LOW (ref 8.9–10.3)
Chloride: 97 mmol/L — ABNORMAL LOW (ref 98–111)
Chloride: 97 mmol/L — ABNORMAL LOW (ref 98–111)
Creatinine, Ser: 1.73 mg/dL — ABNORMAL HIGH (ref 0.61–1.24)
Creatinine, Ser: 1.65 mg/dL — ABNORMAL HIGH (ref 0.61–1.24)
GFR, Estimated: 42 mL/min — ABNORMAL LOW (ref >60)
GFR, Estimated: 45 mL/min — ABNORMAL LOW (ref >60)
Glucose, Bld: 106 mg/dL — ABNORMAL HIGH (ref 70–99)
Glucose, Bld: 114 mg/dL — ABNORMAL HIGH (ref 70–99)
Potassium: 4.3 mmol/L (ref 3.5–5.1)
Potassium: 3.4 mmol/L — ABNORMAL LOW (ref 3.5–5.1)
Sodium: 136 mmol/L (ref 135–145)
Sodium: 132 mmol/L — ABNORMAL LOW (ref 135–145)

## 2021-09-17 LAB — ECHO INTRAOPERATIVE TEE
Height: 69 in
S' Lateral: 6.4 cm
Weight: 2768.98 oz

## 2021-09-17 LAB — COOXEMETRY PANEL
Carboxyhemoglobin: 1.9 % — ABNORMAL HIGH (ref 0.5–1.5)
Carboxyhemoglobin: 1.9 % — ABNORMAL HIGH (ref 0.5–1.5)
Methemoglobin: 0.9 % (ref 0.0–1.5)
Methemoglobin: 1.1 % (ref 0.0–1.5)
O2 Saturation: 45.5 %
O2 Saturation: 70.5 %
Total hemoglobin: 10.1 g/dL — ABNORMAL LOW (ref 12.0–16.0)
Total hemoglobin: 9.3 g/dL — ABNORMAL LOW (ref 12.0–16.0)

## 2021-09-17 LAB — PROTIME-INR
INR: 1.8 — ABNORMAL HIGH (ref 0.8–1.2)
Prothrombin Time: 20.8 seconds — ABNORMAL HIGH (ref 11.4–15.2)

## 2021-09-17 LAB — POCT I-STAT EG7
Acid-Base Excess: 4 mmol/L — ABNORMAL HIGH (ref 0.0–2.0)
Bicarbonate: 29 mmol/L — ABNORMAL HIGH (ref 20.0–28.0)
Calcium, Ion: 1.11 mmol/L — ABNORMAL LOW (ref 1.15–1.40)
HCT: 25 % — ABNORMAL LOW (ref 39.0–52.0)
Hemoglobin: 8.5 g/dL — ABNORMAL LOW (ref 13.0–17.0)
O2 Saturation: 71 %
Potassium: 4.1 mmol/L (ref 3.5–5.1)
Sodium: 135 mmol/L (ref 135–145)
TCO2: 30 mmol/L (ref 22–32)
pCO2, Ven: 47.8 mmHg (ref 44.0–60.0)
pH, Ven: 7.391 (ref 7.250–7.430)
pO2, Ven: 38 mmHg (ref 32.0–45.0)

## 2021-09-17 LAB — POTASSIUM: Potassium: 4 mmol/L (ref 3.5–5.1)

## 2021-09-17 LAB — PLATELET COUNT: Platelets: 125 10*3/uL — ABNORMAL LOW (ref 150–400)

## 2021-09-17 LAB — CREATININE, SERUM
Creatinine, Ser: 1.75 mg/dL — ABNORMAL HIGH (ref 0.61–1.24)
GFR, Estimated: 42 mL/min — ABNORMAL LOW (ref >60)

## 2021-09-17 LAB — PREPARE RBC (CROSSMATCH)

## 2021-09-17 LAB — FIBRINOGEN: Fibrinogen: 458 mg/dL (ref 210–475)

## 2021-09-17 LAB — HEMOGLOBIN AND HEMATOCRIT, BLOOD
HCT: 17.8 % — ABNORMAL LOW (ref 39.0–52.0)
Hemoglobin: 6.1 g/dL — CL (ref 13.0–17.0)

## 2021-09-17 LAB — MAGNESIUM
Magnesium: 2.1 mg/dL (ref 1.7–2.4)
Magnesium: 1.5 mg/dL — ABNORMAL LOW (ref 1.7–2.4)
Magnesium: 2.3 mg/dL (ref 1.7–2.4)

## 2021-09-17 LAB — LACTATE DEHYDROGENASE: LDH: 492 U/L — ABNORMAL HIGH (ref 98–192)

## 2021-09-17 LAB — APTT: aPTT: 61 seconds — ABNORMAL HIGH (ref 24–36)

## 2021-09-17 LAB — HEPARIN LEVEL (UNFRACTIONATED): Heparin Unfractionated: 0.41 IU/mL (ref 0.30–0.70)

## 2021-09-17 SURGERY — INSERTION OF IMPLANTABLE LEFT VENTRICULAR ASSIST DEVICE
Anesthesia: General | Site: Chest

## 2021-09-17 MED ORDER — DEXMEDETOMIDINE HCL IN NACL 400 MCG/100ML IV SOLN
0.0000 ug/kg/h | INTRAVENOUS | Status: DC
  Administered 2021-09-17: 17:00:00 0.5 ug/kg/h via INTRAVENOUS
  Administered 2021-09-18: 01:00:00 0.7 ug/kg/h via INTRAVENOUS
  Filled 2021-09-17: qty 100
  Filled 2021-09-17: qty 200

## 2021-09-17 MED ORDER — ACETAMINOPHEN 160 MG/5ML PO SOLN
1000.0000 mg | Freq: Four times a day (QID) | ORAL | Status: AC
  Administered 2021-09-17 – 2021-09-22 (×4): 1000 mg
  Filled 2021-09-17 (×4): qty 40.6

## 2021-09-17 MED ORDER — BISACODYL 10 MG RE SUPP
10.0000 mg | Freq: Every day | RECTAL | Status: DC
  Administered 2021-09-18: 10:00:00 10 mg via RECTAL
  Filled 2021-09-17: qty 1

## 2021-09-17 MED ORDER — ALBUMIN HUMAN 5 % IV SOLN: INTRAVENOUS | Status: DC | PRN

## 2021-09-17 MED ORDER — ACETAMINOPHEN 650 MG RE SUPP
650.0000 mg | Freq: Once | RECTAL | Status: AC
  Administered 2021-09-17: 16:00:00 650 mg via RECTAL

## 2021-09-17 MED ORDER — VANCOMYCIN HCL IN DEXTROSE 1-5 GM/200ML-% IV SOLN
1000.0000 mg | INTRAVENOUS | Status: AC
  Administered 2021-09-18 – 2021-09-19 (×2): 1000 mg via INTRAVENOUS
  Filled 2021-09-17 (×2): qty 200

## 2021-09-17 MED ORDER — LACTATED RINGERS IV SOLN: INTRAVENOUS | Status: DC

## 2021-09-17 MED ORDER — ROCURONIUM BROMIDE 10 MG/ML (PF) SYRINGE
PREFILLED_SYRINGE | INTRAVENOUS | Status: AC
  Filled 2021-09-17: qty 10

## 2021-09-17 MED ORDER — PROTAMINE SULFATE 10 MG/ML IV SOLN
INTRAVENOUS | Status: DC | PRN
  Administered 2021-09-17: 240 mg via INTRAVENOUS

## 2021-09-17 MED ORDER — SODIUM CHLORIDE 0.9 % IR SOLN
Status: DC | PRN
  Administered 2021-09-17: 1000 mL

## 2021-09-17 MED ORDER — ADULT MULTIVITAMIN W/MINERALS CH: 1.0000 | ORAL_TABLET | Freq: Every day | ORAL | Status: DC

## 2021-09-17 MED ORDER — MIDAZOLAM HCL 2 MG/2ML IJ SOLN
INTRAMUSCULAR | Status: AC
  Filled 2021-09-17: qty 2

## 2021-09-17 MED ORDER — MIDAZOLAM HCL (PF) 5 MG/ML IJ SOLN
INTRAMUSCULAR | Status: DC | PRN
  Administered 2021-09-17: 2 mg via INTRAVENOUS
  Administered 2021-09-17: 1 mg via INTRAVENOUS
  Administered 2021-09-17: 2 mg via INTRAVENOUS
  Administered 2021-09-17: 1 mg via INTRAVENOUS

## 2021-09-17 MED ORDER — SODIUM CHLORIDE 0.9 % IV SOLN
600.0000 mg | Freq: Once | INTRAVENOUS | Status: AC
  Administered 2021-09-18: 08:00:00 600 mg via INTRAVENOUS
  Filled 2021-09-17: qty 600

## 2021-09-17 MED ORDER — VANCOMYCIN HCL 1000 MG IV SOLR
INTRAVENOUS | Status: DC | PRN
  Administered 2021-09-17: 1000 mg

## 2021-09-17 MED ORDER — SODIUM BICARBONATE 8.4 % IV SOLN
50.0000 meq | Freq: Once | INTRAVENOUS | Status: AC
  Administered 2021-09-17: 22:00:00 50 meq via INTRAVENOUS

## 2021-09-17 MED ORDER — SODIUM CHLORIDE 0.9 % IV SOLN
0.0000 ug/min | INTRAVENOUS | Status: DC
  Administered 2021-09-17 – 2021-09-18 (×2): 15 ug/min via INTRAVENOUS
  Administered 2021-09-18: 17:00:00 14 ug/min via INTRAVENOUS
  Administered 2021-09-19 – 2021-09-23 (×5): 6 ug/min via INTRAVENOUS
  Administered 2021-09-24: 5 ug/min via INTRAVENOUS
  Administered 2021-09-25 – 2021-09-26 (×2): 6 ug/min via INTRAVENOUS
  Administered 2021-09-28: 4 ug/min via INTRAVENOUS
  Administered 2021-09-29: 7 ug/min via INTRAVENOUS
  Filled 2021-09-17 (×21): qty 10

## 2021-09-17 MED ORDER — POTASSIUM CHLORIDE 10 MEQ/50ML IV SOLN
10.0000 meq | INTRAVENOUS | Status: AC
  Administered 2021-09-17 (×3): 10 meq via INTRAVENOUS

## 2021-09-17 MED ORDER — ASPIRIN EC 325 MG PO TBEC: 325.0000 mg | DELAYED_RELEASE_TABLET | Freq: Every day | ORAL | Status: DC

## 2021-09-17 MED ORDER — VASOPRESSIN 20 UNIT/ML IV SOLN
INTRAVENOUS | Status: AC
  Filled 2021-09-17: qty 1

## 2021-09-17 MED ORDER — EPINEPHRINE 1 MG/10ML IJ SOSY
PREFILLED_SYRINGE | INTRAMUSCULAR | Status: AC
  Filled 2021-09-17: qty 10

## 2021-09-17 MED ORDER — DEXTROSE 50 % IV SOLN: 0.0000 mL | INTRAVENOUS | Status: DC | PRN

## 2021-09-17 MED ORDER — THROMBIN (RECOMBINANT) 20000 UNITS EX SOLR
CUTANEOUS | Status: AC
  Filled 2021-09-17: qty 20000

## 2021-09-17 MED ORDER — VANCOMYCIN HCL IN DEXTROSE 1-5 GM/200ML-% IV SOLN: 1000.0000 mg | Freq: Two times a day (BID) | INTRAVENOUS | Status: DC

## 2021-09-17 MED ORDER — NOREPINEPHRINE 4 MG/250ML-% IV SOLN: 0.0000 ug/min | INTRAVENOUS | Status: DC

## 2021-09-17 MED ORDER — ETOMIDATE 2 MG/ML IV SOLN
INTRAVENOUS | Status: DC | PRN
  Administered 2021-09-17: 10 mg via INTRAVENOUS

## 2021-09-17 MED ORDER — ONDANSETRON HCL 4 MG/2ML IJ SOLN
4.0000 mg | Freq: Four times a day (QID) | INTRAMUSCULAR | Status: DC | PRN
  Administered 2021-09-22 – 2021-10-22 (×12): 4 mg via INTRAVENOUS
  Filled 2021-09-17 (×13): qty 2

## 2021-09-17 MED ORDER — HEPARIN SODIUM (PORCINE) 1000 UNIT/ML IJ SOLN
INTRAMUSCULAR | Status: AC
  Filled 2021-09-17: qty 1

## 2021-09-17 MED ORDER — SODIUM CHLORIDE 0.9% FLUSH
10.0000 mL | Freq: Two times a day (BID) | INTRAVENOUS | Status: DC
  Administered 2021-09-17 – 2021-09-21 (×8): 10 mL

## 2021-09-17 MED ORDER — PHENYLEPHRINE 40 MCG/ML (10ML) SYRINGE FOR IV PUSH (FOR BLOOD PRESSURE SUPPORT)
PREFILLED_SYRINGE | INTRAVENOUS | Status: DC | PRN
  Administered 2021-09-17 (×5): 80 ug via INTRAVENOUS

## 2021-09-17 MED ORDER — SODIUM CHLORIDE 0.9% FLUSH
3.0000 mL | Freq: Two times a day (BID) | INTRAVENOUS | Status: DC
  Administered 2021-09-18: 21:00:00 3 mL via INTRAVENOUS
  Administered 2021-09-18: 10:00:00 8 mL via INTRAVENOUS
  Administered 2021-09-19 – 2021-10-23 (×27): 3 mL via INTRAVENOUS

## 2021-09-17 MED ORDER — LACTATED RINGERS IV SOLN: 500.0000 mL | Freq: Once | INTRAVENOUS | Status: DC | PRN

## 2021-09-17 MED ORDER — PROTAMINE SULFATE 10 MG/ML IV SOLN
INTRAVENOUS | Status: AC
  Filled 2021-09-17: qty 25

## 2021-09-17 MED ORDER — OXYCODONE HCL 5 MG PO TABS: 5.0000 mg | ORAL_TABLET | ORAL | Status: DC | PRN

## 2021-09-17 MED ORDER — ROSUVASTATIN CALCIUM 5 MG PO TABS: 10.0000 mg | ORAL_TABLET | Freq: Every day | ORAL | Status: DC

## 2021-09-17 MED ORDER — ETOMIDATE 2 MG/ML IV SOLN
INTRAVENOUS | Status: AC
  Filled 2021-09-17: qty 10

## 2021-09-17 MED ORDER — PHENYLEPHRINE 40 MCG/ML (10ML) SYRINGE FOR IV PUSH (FOR BLOOD PRESSURE SUPPORT)
PREFILLED_SYRINGE | INTRAVENOUS | Status: AC
  Filled 2021-09-17: qty 10

## 2021-09-17 MED ORDER — VASOPRESSIN 20 UNIT/ML IV SOLN
INTRAVENOUS | Status: DC | PRN
  Administered 2021-09-17: 1 [IU] via INTRAVENOUS
  Administered 2021-09-17 (×2): 2 [IU] via INTRAVENOUS
  Administered 2021-09-17 (×3): 1 [IU] via INTRAVENOUS
  Administered 2021-09-17 (×3): 2 [IU] via INTRAVENOUS
  Administered 2021-09-17: 1 [IU] via INTRAVENOUS
  Administered 2021-09-17 (×2): 2 [IU] via INTRAVENOUS
  Administered 2021-09-17 (×4): 1 [IU] via INTRAVENOUS
  Administered 2021-09-17: 2 [IU] via INTRAVENOUS
  Administered 2021-09-17 (×4): 1 [IU] via INTRAVENOUS
  Administered 2021-09-17: 2 [IU] via INTRAVENOUS
  Administered 2021-09-17 (×4): 1 [IU] via INTRAVENOUS
  Administered 2021-09-17: 2 [IU] via INTRAVENOUS
  Administered 2021-09-17 (×4): 1 [IU] via INTRAVENOUS

## 2021-09-17 MED ORDER — MONTELUKAST SODIUM 10 MG PO TABS: 5.0000 mg | ORAL_TABLET | Freq: Every day | ORAL | Status: DC

## 2021-09-17 MED ORDER — AMIODARONE IV BOLUS ONLY 150 MG/100ML
INTRAVENOUS | Status: DC | PRN
  Administered 2021-09-17: 150 mg via INTRAVENOUS

## 2021-09-17 MED ORDER — SODIUM CHLORIDE 0.9% FLUSH
3.0000 mL | INTRAVENOUS | Status: DC | PRN
  Administered 2021-10-18: 3 mL via INTRAVENOUS

## 2021-09-17 MED ORDER — FAMOTIDINE IN NACL 20-0.9 MG/50ML-% IV SOLN
20.0000 mg | Freq: Two times a day (BID) | INTRAVENOUS | Status: AC
  Administered 2021-09-17 (×2): 20 mg via INTRAVENOUS
  Filled 2021-09-17 (×2): qty 50

## 2021-09-17 MED ORDER — THROMBIN 20000 UNITS EX SOLR
OROMUCOSAL | Status: DC | PRN
  Administered 2021-09-17 (×3): 4 mL via TOPICAL

## 2021-09-17 MED ORDER — VASOPRESSIN 20 UNITS/100 ML INFUSION FOR SHOCK
0.0400 [IU]/min | INTRAVENOUS | Status: DC
  Administered 2021-09-17 – 2021-09-19 (×5): 0.04 [IU]/min via INTRAVENOUS
  Filled 2021-09-17 (×5): qty 100

## 2021-09-17 MED ORDER — SODIUM CHLORIDE (PF) 0.9 % IJ SOLN
INTRAMUSCULAR | Status: DC | PRN
  Administered 2021-09-17: 10 mL via INTRAVENOUS

## 2021-09-17 MED ORDER — NOREPINEPHRINE 16 MG/250ML-% IV SOLN
0.0000 ug/min | INTRAVENOUS | Status: DC
  Administered 2021-09-17: 22:00:00 50 ug/min via INTRAVENOUS
  Administered 2021-09-17: 16:00:00 2 ug/min via INTRAVENOUS
  Administered 2021-09-18: 18:00:00 18 ug/min via INTRAVENOUS
  Administered 2021-09-18: 04:00:00 38 ug/min via INTRAVENOUS
  Administered 2021-09-21 (×2): 19 ug/min via INTRAVENOUS
  Administered 2021-09-22: 15 ug/min via INTRAVENOUS
  Administered 2021-09-23: 12 ug/min via INTRAVENOUS
  Administered 2021-09-24: 4 ug/min via INTRAVENOUS
  Filled 2021-09-17 (×10): qty 250

## 2021-09-17 MED ORDER — FLUCONAZOLE IN SODIUM CHLORIDE 400-0.9 MG/200ML-% IV SOLN
400.0000 mg | Freq: Once | INTRAVENOUS | Status: AC
  Administered 2021-09-18: 05:00:00 400 mg via INTRAVENOUS
  Filled 2021-09-17: qty 200

## 2021-09-17 MED ORDER — ORAL CARE MOUTH RINSE
15.0000 mL | OROMUCOSAL | Status: DC
  Administered 2021-09-17 – 2021-09-18 (×10): 15 mL via OROMUCOSAL

## 2021-09-17 MED ORDER — PANTOPRAZOLE SODIUM 40 MG PO TBEC: 40.0000 mg | DELAYED_RELEASE_TABLET | Freq: Every day | ORAL | Status: DC

## 2021-09-17 MED ORDER — ENSURE ENLIVE PO LIQD: 237.0000 mL | Freq: Three times a day (TID) | ORAL | Status: DC

## 2021-09-17 MED ORDER — LACTATED RINGERS IV SOLN: INTRAVENOUS | Status: DC | PRN

## 2021-09-17 MED ORDER — ROCURONIUM BROMIDE 10 MG/ML (PF) SYRINGE
PREFILLED_SYRINGE | INTRAVENOUS | Status: DC | PRN
  Administered 2021-09-17: 50 mg via INTRAVENOUS
  Administered 2021-09-17: 20 mg via INTRAVENOUS
  Administered 2021-09-17: 80 mg via INTRAVENOUS
  Administered 2021-09-17 (×4): 50 mg via INTRAVENOUS

## 2021-09-17 MED ORDER — INSULIN REGULAR(HUMAN) IN NACL 100-0.9 UT/100ML-% IV SOLN: INTRAVENOUS | Status: DC

## 2021-09-17 MED ORDER — DEXTROSE 50 % IV SOLN
35.0000 mL | Freq: Once | INTRAVENOUS | Status: AC
  Administered 2021-09-17: 17:00:00 35 mL via INTRAVENOUS
  Filled 2021-09-17: qty 50

## 2021-09-17 MED ORDER — AMIODARONE HCL IN DEXTROSE 360-4.14 MG/200ML-% IV SOLN
30.0000 mg/h | INTRAVENOUS | Status: DC
  Administered 2021-09-18 – 2021-09-22 (×10): 30 mg/h via INTRAVENOUS
  Filled 2021-09-17 (×11): qty 200

## 2021-09-17 MED ORDER — CALCIUM CHLORIDE 10 % IV SOLN
INTRAVENOUS | Status: DC | PRN
  Administered 2021-09-17: 1 g via INTRAVENOUS

## 2021-09-17 MED ORDER — CEFAZOLIN SODIUM-DEXTROSE 2-4 GM/100ML-% IV SOLN
2.0000 g | Freq: Three times a day (TID) | INTRAVENOUS | Status: AC
  Administered 2021-09-17 – 2021-09-19 (×6): 2 g via INTRAVENOUS
  Filled 2021-09-17 (×6): qty 100

## 2021-09-17 MED ORDER — ASPIRIN 300 MG RE SUPP: 300.0000 mg | Freq: Every day | RECTAL | Status: DC

## 2021-09-17 MED ORDER — SODIUM CHLORIDE 0.9 % IV SOLN: 10.0000 mL/h | Freq: Once | INTRAVENOUS | Status: DC

## 2021-09-17 MED ORDER — CHLORHEXIDINE GLUCONATE CLOTH 2 % EX PADS
6.0000 | MEDICATED_PAD | Freq: Every day | CUTANEOUS | Status: DC
  Administered 2021-09-18 – 2021-10-14 (×26): 6 via TOPICAL

## 2021-09-17 MED ORDER — FENTANYL CITRATE (PF) 250 MCG/5ML IJ SOLN
INTRAMUSCULAR | Status: DC | PRN
  Administered 2021-09-17 (×2): 50 ug via INTRAVENOUS
  Administered 2021-09-17: 100 ug via INTRAVENOUS
  Administered 2021-09-17: 50 ug via INTRAVENOUS
  Administered 2021-09-17 (×3): 100 ug via INTRAVENOUS
  Administered 2021-09-17 (×4): 50 ug via INTRAVENOUS

## 2021-09-17 MED ORDER — NOREPINEPHRINE 4 MG/250ML-% IV SOLN
INTRAVENOUS | Status: AC
  Filled 2021-09-17: qty 250

## 2021-09-17 MED ORDER — EPINEPHRINE HCL 5 MG/250ML IV SOLN IN NS: 0.0000 ug/min | INTRAVENOUS | Status: DC

## 2021-09-17 MED ORDER — TRAMADOL HCL 50 MG PO TABS: 50.0000 mg | ORAL_TABLET | ORAL | Status: DC | PRN

## 2021-09-17 MED ORDER — MAGNESIUM SULFATE 4 GM/100ML IV SOLN
4.0000 g | Freq: Once | INTRAVENOUS | Status: AC
  Administered 2021-09-17: 17:00:00 4 g via INTRAVENOUS
  Filled 2021-09-17: qty 100

## 2021-09-17 MED ORDER — ACETAMINOPHEN 500 MG PO TABS
1000.0000 mg | ORAL_TABLET | Freq: Four times a day (QID) | ORAL | Status: AC
  Administered 2021-09-22: 1000 mg via ORAL
  Filled 2021-09-17 (×2): qty 2

## 2021-09-17 MED ORDER — CHLORHEXIDINE GLUCONATE 0.12 % MT SOLN
15.0000 mL | OROMUCOSAL | Status: AC
  Administered 2021-09-17: 17:00:00 15 mL via OROMUCOSAL

## 2021-09-17 MED ORDER — FENTANYL CITRATE (PF) 250 MCG/5ML IJ SOLN
INTRAMUSCULAR | Status: AC
  Filled 2021-09-17: qty 5

## 2021-09-17 MED ORDER — SODIUM CHLORIDE 0.9 % IV SOLN: 250.0000 mL | INTRAVENOUS | Status: DC

## 2021-09-17 MED ORDER — SODIUM CHLORIDE 0.9% IV SOLUTION: Freq: Once | INTRAVENOUS | Status: DC

## 2021-09-17 MED ORDER — SODIUM CHLORIDE 0.9 % IV SOLN: INTRAVENOUS | Status: DC

## 2021-09-17 MED ORDER — MIDAZOLAM HCL 2 MG/2ML IJ SOLN
2.0000 mg | INTRAMUSCULAR | Status: DC | PRN
  Filled 2021-09-17: qty 2

## 2021-09-17 MED ORDER — ASPIRIN 81 MG PO CHEW
324.0000 mg | CHEWABLE_TABLET | Freq: Every day | ORAL | Status: DC
  Administered 2021-09-18: 10:00:00 324 mg
  Filled 2021-09-17: qty 4

## 2021-09-17 MED ORDER — HEPARIN SODIUM (PORCINE) 1000 UNIT/ML IJ SOLN
INTRAMUSCULAR | Status: DC | PRN
  Administered 2021-09-17: 24000 [IU] via INTRAVENOUS

## 2021-09-17 MED ORDER — SODIUM CHLORIDE 0.9% FLUSH: 10.0000 mL | INTRAVENOUS | Status: DC | PRN

## 2021-09-17 MED ORDER — MORPHINE SULFATE (PF) 2 MG/ML IV SOLN
1.0000 mg | INTRAVENOUS | Status: DC | PRN
  Administered 2021-09-17 – 2021-09-18 (×3): 4 mg via INTRAVENOUS
  Filled 2021-09-17 (×4): qty 2

## 2021-09-17 MED ORDER — BISACODYL 5 MG PO TBEC
10.0000 mg | DELAYED_RELEASE_TABLET | Freq: Every day | ORAL | Status: DC
  Administered 2021-09-19 – 2021-09-30 (×10): 10 mg via ORAL
  Filled 2021-09-17 (×12): qty 2

## 2021-09-17 MED ORDER — 0.9 % SODIUM CHLORIDE (POUR BTL) OPTIME
TOPICAL | Status: DC | PRN
  Administered 2021-09-17: 08:00:00 4000 mL

## 2021-09-17 MED ORDER — LIDOCAINE 2% (20 MG/ML) 5 ML SYRINGE
INTRAMUSCULAR | Status: AC
  Filled 2021-09-17: qty 5

## 2021-09-17 MED ORDER — HEMOSTATIC AGENTS (NO CHARGE) OPTIME
TOPICAL | Status: DC | PRN
  Administered 2021-09-17 (×2): 1 via TOPICAL

## 2021-09-17 MED ORDER — ALBUMIN HUMAN 5 % IV SOLN
250.0000 mL | INTRAVENOUS | Status: AC | PRN
  Administered 2021-09-17 – 2021-09-18 (×4): 12.5 g via INTRAVENOUS
  Filled 2021-09-17 (×2): qty 250

## 2021-09-17 MED ORDER — DOCUSATE SODIUM 100 MG PO CAPS: 200.0000 mg | ORAL_CAPSULE | Freq: Every day | ORAL | Status: DC

## 2021-09-17 MED ORDER — CHLORHEXIDINE GLUCONATE 0.12% ORAL RINSE (MEDLINE KIT)
15.0000 mL | Freq: Two times a day (BID) | OROMUCOSAL | Status: DC
  Administered 2021-09-17 – 2021-09-29 (×10): 15 mL via OROMUCOSAL

## 2021-09-17 MED ORDER — SODIUM CHLORIDE 0.45 % IV SOLN: INTRAVENOUS | Status: DC | PRN

## 2021-09-17 MED ORDER — ACETAMINOPHEN 160 MG/5ML PO SOLN: 650.0000 mg | Freq: Once | ORAL | Status: AC

## 2021-09-17 SURGICAL SUPPLY — 133 items
ADAPTER CARDIO PERF ANTE/RETRO (ADAPTER) ×1 IMPLANT
BAG DECANTER FOR FLEXI CONT (MISCELLANEOUS) ×3 IMPLANT
BLADE CLIPPER SURG (BLADE) ×3 IMPLANT
BLADE STERNUM SYSTEM 6 (BLADE) ×3 IMPLANT
BLADE SURG 10 STRL SS (BLADE) IMPLANT
BLADE SURG 12 STRL SS (BLADE) ×2 IMPLANT
BLADE SURG 15 STRL LF DISP TIS (BLADE) IMPLANT
BLADE SURG 15 STRL SS (BLADE) ×1
CANISTER SUCT 3000ML PPV (MISCELLANEOUS) ×3 IMPLANT
CANN PRFSN 3/8XCNCT ST RT ANG (MISCELLANEOUS) ×2
CANNULA ARTERIAL NVNT 3/8 20FR (MISCELLANEOUS) ×3 IMPLANT
CANNULA PRFSN 3/8XCNCT RT ANG (MISCELLANEOUS) IMPLANT
CANNULA SUMP PERICARDIAL (CANNULA) ×3 IMPLANT
CANNULA VEN MTL TIP RT (MISCELLANEOUS) ×1
CANNULA VENOUS LOW PROF 34X46 (CANNULA) ×2 IMPLANT
CANNULA VRC MALB SNGL STG 36FR (MISCELLANEOUS) IMPLANT
CATH FOLEY 2WAY SLVR  5CC 14FR (CATHETERS)
CATH FOLEY 2WAY SLVR 5CC 14FR (CATHETERS) ×2 IMPLANT
CATH HEART VENT LEFT (CATHETERS) IMPLANT
CATH ROBINSON RED A/P 18FR (CATHETERS) ×7 IMPLANT
CATH THORACIC 28FR (CATHETERS) IMPLANT
CATH THORACIC 36FR RT ANG (CATHETERS) IMPLANT
CHLORAPREP W/TINT 26 (MISCELLANEOUS) ×2 IMPLANT
CNTNR URN SCR LID CUP LEK RST (MISCELLANEOUS) IMPLANT
CONN 1/2X1/2X1/2  BEN (MISCELLANEOUS) ×1
CONN 1/2X1/2X1/2 BEN (MISCELLANEOUS) IMPLANT
CONN ST 1/4X3/8  BEN (MISCELLANEOUS)
CONN ST 1/4X3/8 BEN (MISCELLANEOUS) IMPLANT
CONN ST 3/8 X 1/2 (MISCELLANEOUS) ×1 IMPLANT
CONN Y 3/8X3/8X3/8  BEN (MISCELLANEOUS) ×1
CONN Y 3/8X3/8X3/8 BEN (MISCELLANEOUS) IMPLANT
CONT SPEC 4OZ STRL OR WHT (MISCELLANEOUS) ×1
COVER PROBE W GEL 5X96 (DRAPES) ×1 IMPLANT
DRAIN CHANNEL 15F RND FF W/TCR (WOUND CARE) ×1 IMPLANT
DRAIN CHANNEL 28F RND 3/8 FF (WOUND CARE) IMPLANT
DRAIN CHANNEL 32F RND 10.7 FF (WOUND CARE) IMPLANT
DRAIN JACKSON PRATT 10MM FLAT (MISCELLANEOUS) ×1 IMPLANT
DRAPE INCISE IOBAN 66X45 STRL (DRAPES) ×7 IMPLANT
DRAPE PERI GROIN 82X75IN TIB (DRAPES) ×3 IMPLANT
DRAPE WARM FLUID 44X44 (DRAPES) ×3 IMPLANT
DRSG COVADERM 4X14 (GAUZE/BANDAGES/DRESSINGS) ×3 IMPLANT
DRSG COVADERM 4X8 (GAUZE/BANDAGES/DRESSINGS) ×1 IMPLANT
DRSG IV TEGADERM 3.5X4.5 STRL (GAUZE/BANDAGES/DRESSINGS) ×1 IMPLANT
ELECT BLADE 4.0 EZ CLEAN MEGAD (MISCELLANEOUS) ×3
ELECT BLADE 6.5 EXT (BLADE) ×3 IMPLANT
ELECT CAUTERY BLADE 6.4 (BLADE) ×3 IMPLANT
ELECT REM PT RETURN 9FT ADLT (ELECTROSURGICAL) ×6
ELECTRODE BLDE 4.0 EZ CLN MEGD (MISCELLANEOUS) ×2 IMPLANT
ELECTRODE REM PT RTRN 9FT ADLT (ELECTROSURGICAL) IMPLANT
EVACUATOR SILICONE 100CC (DRAIN) ×1 IMPLANT
GAUZE 4X4 16PLY ~~LOC~~+RFID DBL (SPONGE) ×1 IMPLANT
GAUZE SPONGE 4X4 12PLY STRL (GAUZE/BANDAGES/DRESSINGS) ×6 IMPLANT
GAUZE SPONGE 4X4 12PLY STRL LF (GAUZE/BANDAGES/DRESSINGS) ×2 IMPLANT
GLOVE SURG ENC TEXT LTX SZ6.5 (GLOVE) ×2 IMPLANT
GLOVE SURG MICRO LTX SZ6 (GLOVE) ×8 IMPLANT
GLOVE SURG MICRO LTX SZ6.5 (GLOVE) ×1 IMPLANT
GLOVE SURG MICRO LTX SZ7 (GLOVE) ×2 IMPLANT
GLOVE SURG MICRO LTX SZ7.5 (GLOVE) ×3 IMPLANT
GLOVE SURG UNDER POLY LF SZ6 (GLOVE) ×1 IMPLANT
GOWN STRL REUS W/ TWL LRG LVL3 (GOWN DISPOSABLE) ×8 IMPLANT
GOWN STRL REUS W/ TWL XL LVL3 (GOWN DISPOSABLE) ×4 IMPLANT
GOWN STRL REUS W/TWL LRG LVL3 (GOWN DISPOSABLE) ×5
GOWN STRL REUS W/TWL XL LVL3 (GOWN DISPOSABLE) ×2
HEMOSTAT POWDER SURGIFOAM 1G (HEMOSTASIS) ×11 IMPLANT
HEMOSTAT SURGICEL 2X14 (HEMOSTASIS) ×3 IMPLANT
IV ADAPTER SYR DOUBLE MALE LL (MISCELLANEOUS) ×1 IMPLANT
KIT BASIN OR (CUSTOM PROCEDURE TRAY) ×3 IMPLANT
KIT LVAD HEARTMATE 3 W-CNTRL (Prosthesis & Implant Heart) IMPLANT
KIT LVAD HEARTMATE III W-CNTRL (Prosthesis & Implant Heart) ×1 IMPLANT
KIT REMOVER STAPLE SKIN (MISCELLANEOUS) ×1 IMPLANT
KIT SUCTION CATH 14FR (SUCTIONS) ×3 IMPLANT
KIT TURNOVER KIT B (KITS) ×3 IMPLANT
LEAD PACING MYOCARDI (MISCELLANEOUS) ×1 IMPLANT
LINE VENT (MISCELLANEOUS) ×1 IMPLANT
LOOP VESSEL SUPERMAXI WHITE (MISCELLANEOUS) ×1 IMPLANT
NDL SAFETY ECLIPSE 18X1.5 (NEEDLE) IMPLANT
NEEDLE HYPO 18GX1.5 SHARP (NEEDLE) ×1
NS IRRIG 1000ML POUR BTL (IV SOLUTION) ×15 IMPLANT
PACK OPEN HEART (CUSTOM PROCEDURE TRAY) ×3 IMPLANT
PAD ARMBOARD 7.5X6 YLW CONV (MISCELLANEOUS) ×6 IMPLANT
PAD DEFIB R2 (MISCELLANEOUS) ×3 IMPLANT
POSITIONER HEAD DONUT 9IN (MISCELLANEOUS) ×3 IMPLANT
PUNCH AORTIC ROTATE 4.5MM 8IN (MISCELLANEOUS) ×3 IMPLANT
SEALANT SURG COSEAL 8ML (VASCULAR PRODUCTS) ×3 IMPLANT
SENSOR MYOCARDIAL TEMP (MISCELLANEOUS) ×1 IMPLANT
SET MPS 3-ND DEL (MISCELLANEOUS) ×1 IMPLANT
SHEATH AVANTI 11CM 5FR (SHEATH) IMPLANT
SOL PREP POV-IOD 4OZ 10% (MISCELLANEOUS) ×2 IMPLANT
SOL PREP PROV IODINE SCRUB 4OZ (MISCELLANEOUS) ×2 IMPLANT
SPONGE T-LAP 18X18 ~~LOC~~+RFID (SPONGE) ×7 IMPLANT
SPONGE T-LAP 4X18 ~~LOC~~+RFID (SPONGE) ×1 IMPLANT
SUT ETHIBOND 2 0 SH (SUTURE) ×5
SUT ETHIBOND 2 0 SH 36X2 (SUTURE) ×10 IMPLANT
SUT ETHIBOND NAB MH 2-0 36IN (SUTURE) ×38 IMPLANT
SUT ETHILON 3 0 FSL (SUTURE) ×1 IMPLANT
SUT PROLENE 3 0 SH DA (SUTURE) ×6 IMPLANT
SUT PROLENE 3 0 SH1 36 (SUTURE) ×1 IMPLANT
SUT PROLENE 4 0 RB 1 (SUTURE) ×10
SUT PROLENE 4-0 RB1 .5 CRCL 36 (SUTURE) ×8 IMPLANT
SUT PROLENE 5 0 C 1 36 (SUTURE) ×2 IMPLANT
SUT PROLENE 5 0 C1 (SUTURE) ×2 IMPLANT
SUT SILK  1 MH (SUTURE) ×6
SUT SILK 1 MH (SUTURE) ×8 IMPLANT
SUT SILK 1 TIES 10X30 (SUTURE) ×3 IMPLANT
SUT SILK 2 0 SH CR/8 (SUTURE) ×6 IMPLANT
SUT SILK 3 0 SH CR/8 (SUTURE) ×1 IMPLANT
SUT STEEL 6MS V (SUTURE) ×7 IMPLANT
SUT STEEL SZ 6 DBL 3X14 BALL (SUTURE) ×3 IMPLANT
SUT TEM PAC WIRE 2 0 SH (SUTURE) IMPLANT
SUT VIC AB 1 CTX 36 (SUTURE) ×2
SUT VIC AB 1 CTX36XBRD ANBCTR (SUTURE) ×4 IMPLANT
SUT VIC AB 2-0 CT1 27 (SUTURE) ×1
SUT VIC AB 2-0 CT1 TAPERPNT 27 (SUTURE) IMPLANT
SUT VIC AB 2-0 CTX 27 (SUTURE) ×6 IMPLANT
SUT VIC AB 3-0 SH 27 (SUTURE) ×1
SUT VIC AB 3-0 SH 27X BRD (SUTURE) IMPLANT
SUT VIC AB 3-0 SH 8-18 (SUTURE) ×3 IMPLANT
SUT VIC AB 3-0 X1 27 (SUTURE) ×7 IMPLANT
SYR 50ML LL SCALE MARK (SYRINGE) ×3 IMPLANT
SYSTEM SAHARA CHEST DRAIN ATS (WOUND CARE) ×4 IMPLANT
TAPE CLOTH SURG 4X10 WHT LF (GAUZE/BANDAGES/DRESSINGS) ×1 IMPLANT
TAPE PAPER 2X10 WHT MICROPORE (GAUZE/BANDAGES/DRESSINGS) ×1 IMPLANT
TOWEL GREEN STERILE (TOWEL DISPOSABLE) ×3 IMPLANT
TRAY CATH LUMEN 1 20CM STRL (SET/KITS/TRAYS/PACK) ×4 IMPLANT
TUBE CONNECTING 12X1/4 (SUCTIONS) ×3 IMPLANT
TUBE SUCT INTRACARD DLP 20F (MISCELLANEOUS) ×1 IMPLANT
TUBING ART PRESS 72  MALE/FEM (TUBING) ×2
TUBING ART PRESS 72 MALE/FEM (TUBING) IMPLANT
UNDERPAD 30X36 HEAVY ABSORB (UNDERPADS AND DIAPERS) ×3 IMPLANT
VENT LEFT HEART 12002 (CATHETERS)
VRC MALLEABLE SINGLE STG 36FR (MISCELLANEOUS) ×3
WATER STERILE IRR 1000ML POUR (IV SOLUTION) ×6 IMPLANT
YANKAUER SUCT BULB TIP NO VENT (SUCTIONS) ×3 IMPLANT

## 2021-09-17 NOTE — Anesthesia Postprocedure Evaluation (Signed)
Anesthesia Post Note  Patient: Rashi Giuliani  Procedure(s) Performed: INSERTION OF IMPLANTABLE LEFT VENTRICULAR ASSIST DEVICE AND INSERTION OF FEMORAL ARTERIAL LINE (Chest) TRANSESOPHAGEAL ECHOCARDIOGRAM (TEE) APPLICATION OF CELL SAVER (Chest)     Patient location during evaluation: SICU Anesthesia Type: General Level of consciousness: sedated Pain management: pain level controlled Vital Signs Assessment: post-procedure vital signs reviewed and stable Respiratory status: patient remains intubated per anesthesia plan Cardiovascular status: stable Postop Assessment: no apparent nausea or vomiting Anesthetic complications: no   No notable events documented.  Last Vitals:  Vitals:   09/17/21 1645 09/17/21 1700  BP:  (!) 84/52  Pulse:    Resp: 17 15  Temp: 36.7 C 36.6 C  SpO2:      Last Pain:  Vitals:   09/17/21 0400  TempSrc: Core  PainSc: 3                  Effie Berkshire

## 2021-09-17 NOTE — Progress Notes (Addendum)
Advanced Heart Failure VAD Team Note  PCP-Cardiologist: None   Subjective:    12/19 RHC- RA 7, PA 39/14 (25), PCWP 11, CO 6.4 CI 3. Thermo 3.5 1.8. Lasix drip stopped after cath. 12/20 Swan removed.  Norepi 3 mcg added.  12/21 Fever. Blood CX drawn. UA + leukocytes. Started on vanc and cefepime. Diuresed with IV lasix + metolazone.  12/23 Impella 5.5 placed 12/28 HM III LVAD, ICD leads plastered to tricuspid valve with severe TR, valve not replaced and some improvement in TR with LVAD placement.   Seen on afternoon rounds post HM III LVAD. Tricuspid valve repair not completed d/t proximity of ICD wires  Shocked X 3 for VT prior to going on cardiopulmonary bypass  On milrinone 0.25, 45 NE, 0.04 vaso, 10 Epi, NO  SWAN #s CVP 8-9 CO 4.4 CI 2.3 PA 26/11  Received 4 u PRBCs + FFP and cell saver in OR  Low flow alarm around 4 pm. Has albumin running.  LVAD INTERROGATION:  HeartMate III LVAD:   Flow 4.5 liters/min, speed 5400, power 3.6, PI 1.6.   2 low flow alarms around 4 pm  Objective:    Vital Signs:   Temp:  [97.7 F (36.5 C)-98.4 F (36.9 C)] 97.9 F (36.6 C) (12/28 0700) Pulse Rate:  [57-123] 107 (12/28 0700) Resp:  [9-22] 19 (12/28 1538) BP: (88-125)/(74-106) 113/97 (12/28 0700) SpO2:  [92 %-100 %] 98 % (12/28 0700) FiO2 (%):  [50 %] 50 % (12/28 1538) Weight:  [78.5 kg] 78.5 kg (12/28 0500) Last BM Date: 09/10/21 Mean arterial Pressure   Intake/Output:   Intake/Output Summary (Last 24 hours) at 09/17/2021 1616 Last data filed at 09/17/2021 1451 Gross per 24 hour  Intake 6062.38 ml  Output 6140 ml  Net -77.62 ml     Physical Exam    General:  Intubated and sedated HEENT: ETT Neck: JVP 8 - 10 cm. + SWAN L IJ  Cor: Sternal incision covered with dressing. Multiple CT present. Mechanical heart sounds with LVAD hum present.  Lungs: clear anteriorly Abdomen: soft, nondistended.  Driveline: C/D/I; securement device intact and driveline  incorporated Extremities: no cyanosis, clubbing, rash, edema, R radial and L femoral A-lines Neuro: sedated    Telemetry   AF 80s-90s   Labs   Basic Metabolic Panel: Recent Labs  Lab 09/13/21 0517 09/14/21 0256 09/14/21 0420 09/15/21 0408 09/15/21 0426 09/16/21 0415 09/16/21 0515 09/16/21 1825 09/17/21 0332 09/17/21 0610 09/17/21 0852 09/17/21 1000 09/17/21 1026 09/17/21 1056 09/17/21 1134 09/17/21 1202 09/17/21 1231 09/17/21 1317  NA 126*   < > 126* 126*   < > 133*   < > 133* 136   < > 135   135 135   < > 134*   134* 134* 133*   133* 133* 134*   134*  K 3.6   < > 3.9 3.7   < > 4.1   < > 3.7 4.3   < > 4.1   4.2 4.1   < > 4.2   4.2 4.5 4.4   4.4 4.2 3.8   3.8  CL 91*  --  92* 93*  --  96*  --  96* 97*  --  97* 98  --  96*  --  94*  --  97*  CO2 25  --  26 25  --  29  --  28 29  --   --   --   --   --   --   --   --   --  GLUCOSE 113*  --  115* 105*  --  110*  --  116* 106*  --  108* 157*  --  198*  --  234*  --  172*  BUN 41*  --  37* 34*  --  35*  --  32* 34*  --  31* 30*  --  29*  --  32*  --  30*  CREATININE 1.98*  --  1.70* 1.49*  --  1.71*  --  1.62* 1.73*  --  1.60* 1.70*  --  1.60*  --  1.50*  --  1.60*  CALCIUM 8.7*  --  8.9 8.5*  --  8.7*  --  8.6* 9.0  --   --   --   --   --   --   --   --   --   MG 2.2  --  2.1 1.9  --  2.1  --   --  2.1  --   --   --   --   --   --   --   --   --    < > = values in this interval not displayed.    Liver Function Tests: No results for input(s): AST, ALT, ALKPHOS, BILITOT, PROT, ALBUMIN in the last 168 hours. No results for input(s): LIPASE, AMYLASE in the last 168 hours. No results for input(s): AMMONIA in the last 168 hours.  CBC: Recent Labs  Lab 09/13/21 0517 09/14/21 0256 09/14/21 0420 09/15/21 0408 09/15/21 0426 09/16/21 0415 09/16/21 0515 09/17/21 0332 09/17/21 0610 09/17/21 1056 09/17/21 1134 09/17/21 1202 09/17/21 1231 09/17/21 1317  WBC 5.7  --  7.0 6.7  --  7.9  --  8.1  --   --   --   --   --    --   HGB 10.8*   < > 10.7* 9.6*   < > 8.7*   < > 9.7*   < > 8.2*   8.5* 6.1*   7.5* 7.1*   7.5* 7.1* 8.8*   8.8*  HCT 30.2*   < > 30.8* 27.6*   < > 26.1*   < > 29.4*   < > 24.0*   25.0* 17.8*   22.0* 21.0*   22.0* 21.0* 26.0*   26.0*  MCV 87.3  --  86.5 87.1  --  89.1  --  89.9  --   --   --   --   --   --   PLT 165  --  158 128*  --  149*  --  173  --   --  125*  --   --   --    < > = values in this interval not displayed.    INR: No results for input(s): INR in the last 168 hours.  Other results: EKG:    Imaging   ECHO INTRAOPERATIVE TEE  Result Date: 09/17/2021  *INTRAOPERATIVE TRANSESOPHAGEAL REPORT *  Patient Name:   Isaac [name] Date of Exam: 09/17/2021 Medical Rec #:  [id number]           Height:       69.0 in Accession #:    [id number]          Weight:       173.1 lb Date of Birth:  1951/06/18            BSA:          1.94 m Patient Age:    70  years            BP:           113/97 mmHg Patient Gender: M                   HR:           107 bpm. Exam Location:  Anesthesiology Transesophogeal exam was perform intraoperatively during surgical procedure. Patient was closely monitored under general anesthesia during the entirety of examination. Indications:     CAD Native Vessel i25.10 Sonographer:     Raquel Sarna Senior RDCS Performing Phys: Suella Broad MD Diagnosing Phys: Suella Broad MD Complications: No known complications during this procedure.                            POST-OP IMPRESSIONS s/p Insertion of Implantable Left Ventricular Assist Device (Heartmate 3) _ Left Ventricle: Heartmate 3 inflow cannula present. Cannula oriented away from the septal wall. Appears to be stable and functioning well. _ Right Ventricle: RV wall motion and function improved. AICD wires present. New PA catheter traversing the RV into the main pulmonary artery. _ Aorta: No dissection noted after cannula removed. Impella no longer present. _ Left Atrium: The left atrium appears unchanged from pre-bypass. _  Left Atrial Appendage: The left atrial appendage appears unchanged from pre-bypass. _ Aortic Valve: Impella no longer present. No significant AI present. Normal valve motion and function. _ Mitral Valve: MR slightly improved. Normal leaflet function. _ Tricuspid Valve: TR mildly improved, normal leaflet function. _ Pulmonic Valve: The pulmonic valve appears unchanged from pre-bypass. _ Interatrial Septum: The interatrial septum appears unchanged from pre-bypass. _ Interventricular Septum: The interventricular septum appears unchanged from pre-bypass. _ Pericardium: The pericardium appears unchanged from pre-bypass. PRE-OP FINDINGS  Left Ventricle: The left ventricle is less than 20%. The cavity size was severely dilated. Left ventricular diffusely hypokinetic. There is no left ventricular hypertrophy. Left ventricular diastolic function could not be evaluated. Well positioned Impella device noted in the LV ventricle. Right Ventricle: The right ventricle has severely reduced systolic function. The cavity was dilated. There is no increase in right ventricular wall thickness. There is no aneurysm seen. AICD wires and PA catheter present. Left Atrium: Left atrial size was dilated. No left atrial/left atrial appendage thrombus was detected. Left atrial appendage velocity is reduced at less than 40 cm/s. Right Atrium: Right atrial size was dilated. AICD wires and PA catheter present traversing the right atrium. Interatrial Septum: No atrial level shunt detected by color flow Doppler. Agitated saline contrast bubble study was negative, with no evidence of any interatrial shunt. There is no evidence of a patent foramen ovale. Pericardium: There is no evidence of pericardial effusion. There is a moderate pleural effusion in the left lateral region. Mitral Valve: The mitral valve is normal in structure. Mitral valve regurgitation is moderate by color flow Doppler. The MR jet is centrally-directed. There is no evidence of  mitral valve vegetation. There is no evidence of mitral stenosis. Tricuspid Valve: The tricuspid valve was normal in structure. Tricuspid valve regurgitation is severe by color flow Doppler. The jet is directed eccentrically and could be related to ICD wires and PA catheter. No evidence of tricuspid stenosis is present. There is no evidence of tricuspid valve vegetation. Aortic Valve: The aortic valve is tricuspid. Aortic valve regurgitation is trivial by color flow Doppler and likely related to well positioned Impella device. The jet is centrally-directed. There is  no stenosis of the aortic valve. There is no evidence of aortic valve vegetation. Pulmonic Valve: The pulmonic valve was normal in structure. Pulmonic valve regurgitation is trivial by color flow Doppler. Aorta: The aortic root and ascending aorta are normal in size and structure. There is evidence of protruding and layered plaque in the descending aorta; Grade II, measuring 2-43mm in size. Pulmonary Artery: The pulmonary artery is of normal size. PA catheter present. Venous: The inferior vena cava was not well visualized. Shunts: There is no evidence of an atrial septal defect. +--------------+-------++  LEFT VENTRICLE           +--------------+-------++  PLAX 2D                  +--------------+-------++  LVIDd:         7.10 cm   +--------------+-------++  LVIDs:         6.40 cm   +--------------+-------++  LV SV:         55 ml     +--------------+-------++  LV SV Index:   28.08     +--------------+-------++                           +--------------+-------++  Suella Broad MD Electronically signed by Suella Broad MD Signature Date/Time: 09/17/2021/3:58:48 PM    Final    ECHOCARDIOGRAM LIMITED  Result Date: 09/16/2021    ECHOCARDIOGRAM LIMITED REPORT   Patient Name:   Isaac [name] Date of Exam: 09/16/2021 Medical Rec #:  [id number]           Height:       69.0 in Accession #:    [id number]          Weight:       170.9 lb Date of Birth:   1950/10/11            BSA:          1.932 m Patient Age:    70 years            BP:           93/75 mmHg Patient Gender: M                   HR:           95 bpm. Exam Location:  Inpatient Procedure: Limited Echo Indications:    impella placement check  History:        Patient has prior history of Echocardiogram examinations, most                 recent 08/23/2021.  Sonographer:    Johny Chess RDCS Referring Phys: Humphrey  1. Limited echo for impella posiition. Device approximately 4.95 cm distal to the aortic annulus.  2. Left ventricular ejection fraction, by estimation, is 20 to 25%. The left ventricle has severely decreased function. The left ventricle demonstrates global hypokinesis. The left ventricular internal cavity size was moderately dilated.  3. Left atrial size was moderately dilated.  4. Right atrial size was moderately dilated. FINDINGS  Left Ventricle: Left ventricular ejection fraction, by estimation, is 20 to 25%. The left ventricle has severely decreased function. The left ventricle demonstrates global hypokinesis. The left ventricular internal cavity size was moderately dilated. Left Atrium: Left atrial size was moderately dilated. Right Atrium: Right atrial size was moderately dilated. Pericardium: There is no evidence of pericardial effusion. Additional Comments: Limited echo for  impella posiition. Device approximately 4.95 cm distal to the aortic annulus. Jenkins Rouge MD Electronically signed by Jenkins Rouge MD Signature Date/Time: 09/16/2021/12:04:02 PM    Final      Medications:     Scheduled Medications:  [START ON 09/18/2021] acetaminophen  1,000 mg Oral Q6H   Or   [START ON 09/18/2021] acetaminophen (TYLENOL) oral liquid 160 mg/5 mL  1,000 mg Per Tube Q6H   acetaminophen (TYLENOL) oral liquid 160 mg/5 mL  650 mg Per Tube Once   Or   acetaminophen  650 mg Rectal Once   [START ON 09/18/2021] aspirin EC  325 mg Oral Daily   Or   [START ON  09/18/2021] aspirin  324 mg Per Tube Daily   Or   [START ON 09/18/2021] aspirin  300 mg Rectal Daily   [START ON 09/18/2021] bisacodyl  10 mg Oral Daily   Or   [START ON 09/18/2021] bisacodyl  10 mg Rectal Daily   chlorhexidine  15 mL Mouth/Throat NOW   [START ON 09/18/2021] docusate sodium  200 mg Oral Daily   [START ON 09/18/2021] feeding supplement  237 mL Oral TID BM   [START ON 09/18/2021] montelukast  5 mg Oral QHS   [START ON 09/18/2021] multivitamin with minerals  1 tablet Oral Daily   [START ON 09/19/2021] pantoprazole  40 mg Oral Daily   [START ON 09/18/2021] rosuvastatin  10 mg Oral Daily   [START ON 09/18/2021] sodium chloride flush  3 mL Intravenous Q12H    Infusions:  sodium chloride     [START ON 09/18/2021] sodium chloride     sodium chloride     albumin human     amiodarone      ceFAZolin (ANCEF) IV     dexmedetomidine (PRECEDEX) IV infusion 0.5 mcg/kg/hr (09/17/21 1530)   epinephrine     famotidine (PEPCID) IV     [START ON 09/18/2021] fluconazole (DIFLUCAN) IV     insulin 1.6 Units/hr (09/17/21 1530)   lactated ringers     lactated ringers     lactated ringers     magnesium sulfate     milrinone Stopped (09/17/21 0853)   norepinephrine (LEVOPHED) Adult infusion 2 mcg/min (09/17/21 1604)   potassium chloride     [START ON 09/18/2021] rifampin (RIFADIN) IVPB     [START ON 09/18/2021] vancomycin     vasopressin      PRN Medications: sodium chloride, albumin human, albuterol, dextrose, lactated ringers, midazolam, morphine injection, ondansetron (ZOFRAN) IV, oxyCODONE, [START ON 09/18/2021] sodium chloride flush, traMADol   Assessment/Plan:    1. Acute on chronic systolic CHF:  Long-standing cardiomyopathy.  [place].  Echo this admission with EF < 20%, severe LV dilation, restrictive diastolic function, moderate RV dysfunction, moderate MR, mod-severe TR. Cause of cardiomyopathy is uncertain.  He has a history of CAD, but I do not think that  the described CAD from the past could explain his cardiomyopathy, but CAD could have progressed.   -With difficulty tolerating GDMT/need for midodrine and cardiorenal syndrome as well as profound volume overload + NYHA class IV symptoms,  concerned for low output HF. Co-ox off milrinone was 36%, milrinone started and increased to 0.375 but CO remained low. NE added and Impella 5.5 placed 12/23. EF 10% on TEE 12/23.  -s/p HM III VAD on 12/28 -wean pressors/inotropes as able, keep MAP > 70 -Co-ox pending -CVP 8-9. Will hold off on diuresis for 1-2 days 2. Tricuspid regurgitation -Has moderate-severe TR -Tricuspid repair not done  at time of VAD d/t proximity of ICD wires 3. Atrial fibrillation: Persistent since 10/22 based on device interrogation.  Remains on amio for rate control.  -Continue amiodarone gtt.  -Eventually start anticoagulation 4. AKI on CKD stage 3: Creatinine 1.99 => 2.14=>2.4 -> 1.85 -> 1.98 -> 1.70 -> 1.49 -> 1.7 -> 1.6.   - Continue hemodynamic support. - Daily BMET 5. Syncope: Prior to admission, stood up then passed out.  ?Orthostatic versus arrhythmic.  He had VT run terminated by [employer] that may have been around the time of syncope.   - Now on amiodarone gtt.  6. Elevated TSH/elevated free T4: Mixed picture, ?sick euthyroid.   7. CAD: History of PCI to OM1 in 2007 and RCA in 2013.  No chest pain or ACS.  - Deferring cardiac cath due to AKI and plan for VAD - Continue Crestor.  8. Malnutrition: Prealbumin 7.8.  He is eating well.  - Dietitian following 9. ID: Had fever 12/21, PCT 1.03.  Has been afebrile since with normal WBCs.  Blood cultures negative.  ?Phlebitis at IV site. Completed course of abx.  - Now on cefazolin and vancomycin post VAD 10. L Pleural Effusion: Noted on CT.  11. Hyponatremia: Hypervolemic hyponatremia.  Tolvaptan 12/26.   - Na 134 today - Restrict FW 12. Anemia:  - 4 u pRBCs + 4 FFP in OR - Follow CBC   I reviewed the LVAD parameters from  today, and compared the results to the patient's prior recorded data.  No programming changes were made.  The LVAD is functioning within specified parameters.  The patient performs LVAD self-test daily.  LVAD interrogation was negative for any significant power changes, alarms or PI events/speed drops.  LVAD equipment check completed and is in good working order.  Back-up equipment present.   LVAD education done on emergency procedures and precautions and reviewed exit site care.  Length of Stay: [address] Lynder Parents, PA-C 09/17/2021, 4:16 PM  VAD Team --- VAD ISSUES ONLY--- Pager [phone] (7am - 7am)  Advanced Heart Failure Team  Pager [phone] (M-F; 7a - 5p)  Please contact [facility] for night-coverage after hours (5p -7a ) and weekends on [email or web address]  Patient seen with PA, agree with the above note.   Patient now s/p HM3 LVAD placement, on the above pressors/inotropes.  MAP currently about 70 with CI 2.3. Making urine, last creatinine 1.65 (stable).    Low flows earlier this afternoon, now getting albumin and lower speed to 5300 rpm, low flows resolved.   General: Intubated/sedated.  HEENT: Normal. Neck: Supple, JVP 8-9cm. Carotids OK.  Cardiac:  Mechanical heart sounds with LVAD hum present.  Lungs:  CTAB, normal effort.  Abdomen:  NT, ND, no HSM. No bruits or masses. +BS  LVAD exit site: Well-healed and incorporated. Dressing dry and intact. No erythema or drainage. Stabilization device present and accurately applied. Driveline dressing changed daily per sterile technique. Extremities:  Warm and dry. No cyanosis, clubbing, rash, or edema.  Neuro:  Sedated on vent.   Continue post-op care.  Slow weaning of pressors as tolerated.  Renal function so far stable.  Blood products to be given as needed.  Will leave speed at 5300 rpm overnight, may be able to increase tomorrow.   Stable rhythm, appears to be NSR.   CRITICAL CARE Performed by: Loralie Champagne  Total critical care  time: 35 minutes  Critical care time was exclusive of separately billable procedures and treating other patients.  Critical  care was necessary to treat or prevent imminent or life-threatening deterioration.  Critical care was time spent personally by me on the following activities: development of treatment plan with patient and/or surrogate as well as nursing, discussions with consultants, evaluation of patient's response to treatment, examination of patient, obtaining history from patient or surrogate, ordering and performing treatments and interventions, ordering and review of laboratory studies, ordering and review of radiographic studies, pulse oximetry and re-evaluation of patient's condition.  Loralie Champagne 09/17/2021 5:22 PM

## 2021-09-17 NOTE — Op Note (Signed)
CARDIOVASCULAR SURGERY OPERATIVE NOTE  Koichi Platte [id number] 09/17/2021   Surgeon:  Gaye Pollack, MD  First Assistant: Lars Pinks, PA-C : An experienced assistant was required given the complexity of this surgery and the standard of surgical care. The assistant was needed for exposure, dissection, suctioning, retraction of delicate tissues and sutures, instrument exchange and for overall help during this procedure.   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   Implantation of HeartMate 3 Left Ventricular Assist Device. 4.   Removal of Impella 5.5.   Anesthesia:  General Endotracheal   Clinical History/Surgical Indication:  This 70 year old gentleman has longstanding cardiomyopathy of unclear etiology with admission for acute on chronic systolic congestive heart failure with an echocardiogram showing an ejection fraction less than 20% with severe left ventricular dilation.  There is also moderate RV systolic dysfunction with moderate MR and moderate to severe TR.  He presented with NYHA class IV symptoms with a Co-ox of 36% off milrinone.  Despite increasing milrinone to 0.375 his cardiac output remained low.  He has stabilized since insertion of an Impella 5.5 with a Co-ox of 65% and a cardiac index of 2.6 on milrinone 0.5 and the Impella at P8.  His renal function has improved and he has been diuresing well.  I agree that left ventricular assist device insertion as destination therapy is the best treatment for this patient.  He is tuned up as well as he is going to be.  We will need to evaluate his tricuspid regurgitation in the operating room to decide if tricuspid valve repair is indicated.  He does have moderate RV systolic dysfunction although he has been maintaining relatively low CVP with normal PA pressures and wedge. I discussed the operative  procedure with the patient and his wife including alternatives, benefits and risks; including but not limited to bleeding, blood transfusion, infection, stroke, myocardial infarction, pump failure, pump thrombosis, GI bleeding, organ dysfunction, and death.  [name] understands and agrees to proceed.  We will schedule surgery for tomorrow morning.      Preparation:  The patient was taken directly from the ICU to the OR with the Impella in place. The correct patient, correct operation were confirmed with the patient and the consent was signed by me. Preoperative antibiotics were given. A radial arterial line were placed by the anesthesia team. A sheath was placed in the left IJ vein but the swan was not inserted due to possible tricuspid valve repair. The patient was positioned supine on the operating room table. After being placed under general endotracheal anesthesia by the anesthesia team a foley catheter was placed. The neck, chest, abdomen, and both legs were prepped with betadine soap and solution and draped in the usual sterile manner. A surgical time-out was taken and the correct patient and operative procedure were confirmed with the nursing and anesthesia staff.   Pre-bypass TEE:   Complete TEE assessment was performed by Dr. Suella Broad.   *INTRAOPERATIVE TRANSESOPHAGEAL REPORT *       Patient Name:   PERRION DIESEL Date of Exam: 09/17/2021  Medical Rec #:  [id number]           Height:       69.0 in  Accession #:    [id number]          Weight:       173.1 lb  Date  of Birth:  1951/07/28            BSA:          1.94 m  Patient Age:    70 years            BP:           113/97 mmHg  Patient Gender: M                   HR:           107 bpm.  Exam Location:  Anesthesiology   Transesophogeal exam was perform intraoperatively during surgical  procedure.  Patient was closely monitored under general anesthesia during the entirety  of  examination.   Indications:      CAD Native Vessel i25.10  Sonographer:     Raquel Sarna Senior RDCS  Performing Phys: Suella Broad MD  Diagnosing Phys: Suella Broad MD   Complications: No known complications during this procedure.                             POST-OP IMPRESSIONS  s/p Insertion of Implantable Left Ventricular Assist Device (Heartmate 3)  _ Left Ventricle: Heartmate 3 inflow cannula present. Cannula oriented  away from  the septal wall. Appears to be stable and functioning well.  _ Right Ventricle: RV wall motion and function improved. AICD wires  present. New  PA catheter traversing the RV into the main pulmonary artery.  _ Aorta: No dissection noted after cannula removed. Impella no longer  present.  _ Left Atrium: The left atrium appears unchanged from pre-bypass.  _ Left Atrial Appendage: The left atrial appendage appears unchanged from  pre-bypass.  _ Aortic Valve: Impella no longer present. No significant AI present.  Normal  valve motion and function.  _ Mitral Valve: MR slightly improved. Normal leaflet function.  _ Tricuspid Valve: TR mildly improved, normal leaflet function.  _ Pulmonic Valve: The pulmonic valve appears unchanged from pre-bypass.  _ Interatrial Septum: The interatrial septum appears unchanged from  pre-bypass.  _ Interventricular Septum: The interventricular septum appears unchanged  from  pre-bypass.  _ Pericardium: The pericardium appears unchanged from pre-bypass.   PRE-OP FINDINGS   Left Ventricle: The left ventricle is less than 20%. The cavity size was  severely dilated. Left ventricular diffusely hypokinetic. There is no left  ventricular hypertrophy. Left ventricular diastolic function could not be  evaluated. Well positioned  Impella device noted in the LV ventricle.   Right Ventricle: The right ventricle has severely reduced systolic  function. The cavity was dilated. There is no increase in right  ventricular wall thickness. There is no aneurysm seen. AICD wires  and PA  catheter present.   Left Atrium: Left atrial size was dilated. No left atrial/left atrial  appendage thrombus was detected. Left atrial appendage velocity is reduced  at less than 40 cm/s.   Right Atrium: Right atrial size was dilated. AICD wires and PA catheter  present traversing the right atrium.   Interatrial Septum: No atrial level shunt detected by color flow Doppler.  Agitated saline contrast bubble study was negative, with no evidence of  any interatrial shunt. There is no evidence of a patent foramen ovale.   Pericardium: There is no evidence of pericardial effusion. There is a  moderate pleural effusion in the left lateral region.   Mitral Valve: The mitral valve is normal in structure. Mitral valve  regurgitation is  moderate by color flow Doppler. The MR jet is  centrally-directed. There is no evidence of mitral valve vegetation. There  is no evidence of mitral stenosis.   Tricuspid Valve: The tricuspid valve was normal in structure. Tricuspid  valve regurgitation is severe by color flow Doppler. The jet is directed  eccentrically and could be related to ICD wires and PA catheter. No  evidence of tricuspid stenosis is  present. There is no evidence of tricuspid valve vegetation.   Aortic Valve: The aortic valve is tricuspid. Aortic valve regurgitation is  trivial by color flow Doppler and likely related to well positioned  Impella device. The jet is centrally-directed. There is no stenosis of the  aortic valve. There is no evidence  of aortic valve vegetation.   Pulmonic Valve: The pulmonic valve was normal in structure. Pulmonic valve  regurgitation is trivial by color flow Doppler.    Aorta: The aortic root and ascending aorta are normal in size and  structure. There is evidence of protruding and layered plaque in the  descending aorta; Grade II, measuring 2-37mm in size.   Pulmonary Artery: The pulmonary artery is of normal size. PA catheter  present.    Venous: The inferior vena cava was not well visualized.   Shunts: There is no evidence of an atrial septal defect.   +--------------+-------++   LEFT VENTRICLE            +--------------+-------++   PLAX 2D                   +--------------+-------++   LVIDd:         7.10 cm    +--------------+-------++   LVIDs:         6.40 cm    +--------------+-------++   LV SV:         55 ml      +--------------+-------++   LV SV Index:   28.08      +--------------+-------++                             +--------------+-------++      Suella Broad MD  Electronically signed by Suella Broad MD  Signature Date/Time: 09/17/2021/3:58:48 PM         Final     Review of the echo showed severe TR but the annulus did not appear dilated and the TR was eccentric which made me think that it might be due to the ICD/pacer leads. I thought it would be reasonable to examine the valve to decide if an annuloplasty would improve the TR.   Median sternotomy:    A median sternotomy was performed. The pericardium was opened in the midline. Right ventricular function appeared fairly normally visually. The ascending aorta was of normal size and had no palpable plaque. There were no contraindications to aortic cannulation or cross-clamping. The pericardium was opened along the left diaphragm out to the apex. The left diaphragm was taken down from the anterior chest wall over a short distance medially using electrocautery. Then a 4 cm transverse incision was made to the left of the umbilicus and continued down to the rectus fascia using electrocautery. A skin punch was used to create the drive line exit site about 3 cm below the left costal margin in the anterior axillary line. The tunneling device was passed in a subcutaneous plane from the transverse abdominal incision to the pericardium. The other tunneling device was passed from the  abdominal incision the the skin exit site.  Cardiopulmonary Bypass:   The  patient was fully systemically heparinized and the ACT was maintained > 400 sec. The proximal aortic arch was cannulated with a 20 F aortic cannula for arterial inflow. Bi-caval venous cannulation was performed using a 3F metal tip right angle cannula in the SVC and a 36 F plastic malleable cannula in the IVC. Carbon dioxide was insufflated into the pericardium at 6L/min throughout the procedure to minimize intracardiac air. The systemic temperature was allowed to drift downward slightly during the procedure and the patient was actively rewarmed.  Removal of Impella 5.5:  The Impella was turned off just before bypass was initiated. The incision was opened and the axillary artery graft exposed. There was a large amount of hematoma in the wound that was evacuated. The Impella was removed without difficulty and the graft was ligated with a short stump using a #1 Silk tie and a 3-0 Prolene pledgetted horizontal mattress suture.   Examination of tricuspid valve:  The superior and inferior vena cavae were encircled with tapes. The right atrium was opened obliquely along the lateral wall. The valve retractor was placed. Examination of the tricuspid valve showed no annular dilation. The ICU lead was completely adherent to the septal leaflet along its length which was causing some tethering of the leaflet. This could not be safely separated and I would have had to remove the ICD and pacing leads and replace the valve with a prosthesis to correct this problem. The RV was filled with saline using a bulb syringe and the tricuspid valve appeared fairly competent except for this tethering of the septal leaflet. I though it would be best in this case to leave the valve alone. The atrium was closed in two layers with continuous 4-0 Prolene suture.   Insertion of apical outflow cannula:  Laparotomy pads were placed behind the heart to aid exposure of the apex. A site was chosen on the anterior wall lateral to the LAD  and superior to the true apex. A stab incision was made and a foley catheter placed through the coring knife and into the left ventricle. The balloon was inflated and with gentle traction on the catheter the coring knife was carefully advanced toward the mitral valve to create the ventriculotomy. The balloon was deflated and removed. A sump was placed into the LV and the ventricular core excised and sent to pathology. Trabeculations that might cause obstruction were excised. Then a series of 2-0 Ethibond horizontal mattress sutures were placed around the ventriculotomy. This took 12 sutures. The sutures were placed through the apical cuff. The sutures were tied and the apical cuff appeared to be well-sealed to the apex. Then volume was left in the heart and the lungs inflated. The HeartMate 3 was brought onto the operative field. The outflow graft and bend relief had been attached to the pump by the nurses with monitoring by the company rep.The inflow cannula was inserted into the apical cuff and the apical cuff lock engaged.  The drive line was then tunneled using the previously placed tunneling devices.  The black line on the graft was oriented along the margin of the pericardium to prevent twisting of the graft. The bend relief was fully engaged and tested with an axial pull. The pump was turned into the appropriate position. The heart and pump were returned to the pericardium and appeared to sit nicely.   Aortic outflow graft anastomosis:  The outflow graft was distended  and positioned along the inferior margin of the heart and around the right atrium. The outflow graft was cut to the appropriate length. The ascending aorta was partially occluded with a Lemole clamp. A slightly diagonall midline aortotomy was created and the ends enlarged with a 4.5 mm punch. The outflow graft was anastomosed to the aorta using 4-0 prolene pledgetted sutures at the toe and heal that were run down each side. Coseal was used  to seal the needle holes in the graft. The Lemole clamp was removed and the graft clamped with a peripheral Debakey clamp and deaired using a 21 gauge needle. The aortic anastomosis was hemostatic.   Weaning from bypass:  The patient was started on Milrinone 0.25 mcg/kg/min, epinephrine 4 mcg/kg/min, levophed, vasopressin and nitric oxide at 20 ppm. The HeartMate 3 pump was started at 3000 rpm. TEE confirmed that the intracardiac air was removed.  Cardiopulmonary bypass flow was decreased to half flow and the clamp removed from the outflow graft. The speed was gradually increased to 4000 rpm. Flow was decreased to 1/4 and the speed gradually increased to 4800 rpm. Cardiopulmonary bypass was discontinued and the speed increased to 5400 rpm.  CPB time was 161 minutes. TEE showed excellent apical cannula position. There was mild MR, moderate TR. RV function appeared improved.  Completion:  Two temporary epicardial pacing wires were placed on the right atrium and a bipolar lead on the inferior wall. Heparin was fully reversed with protamine and the aortic and venous cannulas removed. Hemostasis was achieved. Mediastinal drainage tubes and a bilateral pleural chest tubes were placed. A large Blake drain was placed in the pump pocket.  The sternum was closed with  #6 stainless steel wires. The fascia was closed with continuous # 1 vicryl suture. The subcutaneous tissue was closed with 2-0 vicryl continuous suture. The skin was closed with 3-0 vicryl subcuticular suture. The abdominal incision was closed with continuous 3-0 vicryl subcutaneous suture and 3-0 vicryl subcuticular skin suture. The drive line exit site was re-approximated  3-0 nylon skin sutures. The two drive line fasteners were applied. The axillary incision had a large cavity after evacuating the hematoma and I decided to place a small Blake drain in the wound. The pectoral is muscle was approximated with continuous 2-0 Vicryl suture. The  subcutaneous tissue was approximated with continuous 3-0 Vicryl suture. The skin was closed with continuous 3-0 Vicryl subcuticular suture. All sponge, needle, and instrument counts were reported correct at the end of the case. Dry sterile dressings were placed over the incisions and around the chest tubes which were connected to pleurevac suction. The patient was then transported to the surgical intensive care unit in critical but stable condition.

## 2021-09-17 NOTE — Anesthesia Procedure Notes (Signed)
Arterial Line Insertion Start/End12/28/2022 8:05 AM, 09/17/2021 8:10 AM Performed by: Effie Berkshire, MD, anesthesiologist  Patient location: Pre-op. Preanesthetic checklist: patient identified, IV checked, site marked, risks and benefits discussed, surgical consent, monitors and equipment checked, pre-op evaluation, timeout performed and anesthesia consent Lidocaine 1% used for infiltration Left, radial was placed Catheter size: 20 G Hand hygiene performed  and maximum sterile barriers used   Attempts: 1 Procedure performed without using ultrasound guided technique. Following insertion, dressing applied and Biopatch. Post procedure assessment: normal and unchanged  Patient tolerated the procedure well with no immediate complications.

## 2021-09-17 NOTE — Interval H&P Note (Signed)
History and Physical Interval Note:  09/17/2021 7:00 AM  Isaac [name]  has presented today for surgery, with the diagnosis of CHF.  The various methods of treatment have been discussed with the patient and family. After consideration of risks, benefits and other options for treatment, the patient has consented to  Procedure(s): INSERTION OF IMPLANTABLE LEFT VENTRICULAR ASSIST DEVICE (N/A) POSSIBLE TRICUSPID VALVE REPLACEMENT (N/A) TRANSESOPHAGEAL ECHOCARDIOGRAM (TEE) (N/A) as a surgical intervention.  The patient's history has been reviewed, patient examined, no change in status, stable for surgery.  I have reviewed the patient's chart and labs.  Questions were answered to the patient's satisfaction.     Gaye Pollack

## 2021-09-17 NOTE — Brief Op Note (Signed)
09/06/2021 - 09/17/2021  1:11 PM  PATIENT:  Isaac [name]  70 y.o. male  PRE-OPERATIVE DIAGNOSIS: Long standing cardiomyopathy, end stage CHF, mild to moderate tricuspid valve regurgitation  POST-OPERATIVE DIAGNOSIS:  Long standing cardiomyopathy, end stage CHF, mild to moderate tricuspid valve regurgitation  PROCEDURE:  TRANSESOPHAGEAL ECHOCARDIOGRAM (TEE), INSERTION OF IMPLANTABLE LEFT VENTRICULAR ASSIST DEVICE (HEART MATE 3), REMOVAL of IMPELLA, APPLICATION OF CELL SAVER   SURGEON:  Surgeon(s) and Role:    Gaye Pollack, MD - Primary  PHYSICIAN ASSISTANT: Lars Pinks PA-C  ASSISTANTS: Dineen Kid RNFA  ANESTHESIA:   general  EBL:  Per anesthesia, perfusion record   BLOOD ADMINISTERED:Multiple FFP and cell saver  DRAINS:  Chest tubes placed in the mediastinal and pleural spaces    COUNTS CORRECT:  YES  DICTATION: .Dragon Dictation  PLAN OF CARE: Admit to inpatient   PATIENT DISPOSITION:  ICU - intubated and hemodynamically stable.   Delay start of Pharmacological VTE agent (>24hrs) due to surgical blood loss or risk of bleeding: no  BASELINE WEIGHT: 78.5 kg

## 2021-09-17 NOTE — Anesthesia Procedure Notes (Signed)
Procedure Name: Intubation Date/Time: 09/17/2021 8:03 AM Performed by: Renato Shin, CRNA Pre-anesthesia Checklist: Patient identified, Emergency Drugs available, Suction available and Patient being monitored Patient Re-evaluated:Patient Re-evaluated prior to induction Oxygen Delivery Method: Circle system utilized Preoxygenation: Pre-oxygenation with 100% oxygen Induction Type: IV induction Ventilation: Mask ventilation without difficulty Laryngoscope Size: Miller and 3 Grade View: Grade I Tube type: Oral Tube size: 8.0 mm Number of attempts: 1 Airway Equipment and Method: Stylet and Oral airway Placement Confirmation: ETT inserted through vocal cords under direct vision, positive ETCO2 and breath sounds checked- equal and bilateral Secured at: 22 cm Tube secured with: Tape Dental Injury: Teeth and Oropharynx as per pre-operative assessment

## 2021-09-17 NOTE — Progress Notes (Signed)
Handed pt off to CRNA/OR team at bedside.

## 2021-09-17 NOTE — Anesthesia Procedure Notes (Addendum)
Central Venous Catheter Insertion Performed by: Effie Berkshire, MD, anesthesiologist Start/End12/28/2022 8:15 AM, 09/17/2021 8:20 AM Patient location: OR. Preanesthetic checklist: patient identified, IV checked, site marked, risks and benefits discussed, surgical consent, monitors and equipment checked, pre-op evaluation, timeout performed and anesthesia consent Position: Trendelenburg Lidocaine 1% used for infiltration and patient sedated Hand hygiene performed , maximum sterile barriers used  and Seldinger technique used Catheter size: 9 Fr Total catheter length 10. Central line was placed.Sheath introducer Swan type:thermodilution PA Cath depth:50 Procedure performed using ultrasound guided technique. Ultrasound Notes:anatomy identified, needle tip was noted to be adjacent to the nerve/plexus identified, no ultrasound evidence of intravascular and/or intraneural injection and image(s) printed for medical record Attempts: 1 Following insertion, line sutured, dressing applied and Biopatch. Post procedure assessment: blood return through all ports, free fluid flow and no air  Patient tolerated the procedure well with no immediate complications.

## 2021-09-17 NOTE — Progress Notes (Signed)
PT Cancellation Note  Patient Details Name: Isaac [name] MRN: [id number] DOB: 01-28-1951   Cancelled Treatment:    Reason Eval/Treat Not Completed: Patient at procedure or test/unavailable (to OR for LVAD). Will follow-up for PT Re-evaluation post-op as appropriate.  Mabeline Caras, PT, DPT Acute Rehabilitation Services  Pager [phone] Office [place] 09/17/2021, 7:42 AM

## 2021-09-17 NOTE — Transfer of Care (Signed)
Immediate Anesthesia Transfer of Care Note  Patient: Kashtyn Jankowski  Procedure(s) Performed: INSERTION OF IMPLANTABLE LEFT VENTRICULAR ASSIST DEVICE AND INSERTION OF FEMORAL ARTERIAL LINE (Chest) TRANSESOPHAGEAL ECHOCARDIOGRAM (TEE) APPLICATION OF CELL SAVER (Chest)  Patient Location: SICU  Anesthesia Type:General  Level of Consciousness: sedated and Patient remains intubated per anesthesia plan  Airway & Oxygen Therapy: Patient remains intubated per anesthesia plan and Patient placed on Ventilator (see vital sign flow sheet for setting)  Post-op Assessment: Report given to RN and Post -op Vital signs reviewed and stable  Post vital signs: Reviewed and stable  Last Vitals:  Vitals Value Taken Time  BP    Temp 37 C 09/17/21 1549  Pulse    Resp 12 09/17/21 1549  SpO2    Vitals shown include unvalidated device data.  Last Pain:  Vitals:   09/17/21 0400  TempSrc: Core  PainSc: 3       Patients Stated Pain Goal: 0 (04/88/89 1694)  Complications: No notable events documented.

## 2021-09-17 NOTE — Progress Notes (Signed)
Pharmacy Antibiotic Note  Isaac [name] is a 70 y.o. male admitted on 09/06/2021 with surgical prophylaxis.  Pharmacy has been consulted for LVAD dosing.  S/p LVAD 12/28. Received vancomycin 1250 mg IV pre-op dose on 12/28@0853 . WBC 8.1, afebrile. Scr 1.6 (43 mL/min) - on pressors post-op. Discussed with team - was on cefepime prior to surgery - will use post-op abx cefazolin and vancomycin now.   Plan: Vancomycin 1000 mg IV every 24 hours starting on 12/29@0900  for 48 hours after surgery Monitor renal fx, clinical pic, and need for levels if continues past 48 hours  Height: 5\' 9"  (175.3 cm) Weight: 78.5 kg (173 lb 1 oz) IBW/kg (Calculated) : 70.7  Temp (24hrs), Avg:98.1 F (36.7 C), Min:97.7 F (36.5 C), Max:98.4 F (36.9 C)  Recent Labs  Lab 09/13/21 0517 09/13/21 0646 09/14/21 0420 09/15/21 0408 09/16/21 0415 09/16/21 1825 09/17/21 0332 09/17/21 0852 09/17/21 1000 09/17/21 1056 09/17/21 1202 09/17/21 1317  WBC 5.7  --  7.0 6.7 7.9  --  8.1  --   --   --   --   --   CREATININE 1.98*  --  1.70* 1.49* 1.71*   < > 1.73* 1.60* 1.70* 1.60* 1.50* 1.60*  VANCORANDOM  --  14  --   --   --   --   --   --   --   --   --   --    < > = values in this interval not displayed.    Estimated Creatinine Clearance: 43.6 mL/min (A) (by C-G formula based on SCr of 1.6 mg/dL (H)).    Allergies  Allergen Reactions   Mushroom Extract Complex Nausea And Vomiting   Neosporin [Neomycin-Bacitracin Zn-Polymyx] Hives    Antimicrobials this admission: Vanc 12/21>12/25, 12/28>> Cefepime 12/21>12/27 Cefazolin 12/28>>  Dose adjustments this admission: N/A  Microbiology results: 12/21 bld: ngtd 12/21 urine: multiple 12/23 urine: ng 12/218 MRSA pcr neg  Thank you for allowing pharmacy to be a part of this patients care.  Antonietta Jewel, PharmD, [place] Clinical Pharmacist  Phone: [phone] 09/17/2021 3:58 PM  Please check AMION for all [facility] phone numbers After 10:00 PM,  call [facility] [phone]

## 2021-09-17 NOTE — Anesthesia Preprocedure Evaluation (Addendum)
Anesthesia Evaluation  Patient identified by MRN, date of birth, ID band Patient awake    Reviewed: Allergy & Precautions, NPO status , Patient's Chart, lab work & pertinent test results  Airway Mallampati: I  TM Distance: >3 FB Neck ROM: Full    Dental  (+) Edentulous Lower, Dental Advisory Given   Pulmonary former smoker,    breath sounds clear to auscultation       Cardiovascular hypertension, Pt. on home beta blockers + CAD, + Past MI and +CHF   Rhythm:Regular Rate:Normal     Neuro/Psych negative neurological ROS  negative psych ROS   GI/Hepatic negative GI ROS, Neg liver ROS,   Endo/Other  negative endocrine ROS  Renal/GU Renal disease     Musculoskeletal negative musculoskeletal ROS (+)   Abdominal Normal abdominal exam  (+)   Peds  Hematology negative hematology ROS (+)   Anesthesia Other Findings   Reproductive/Obstetrics                            Anesthesia Physical Anesthesia Plan  ASA: 4  Anesthesia Plan: General   Post-op Pain Management:    Induction: Intravenous  PONV Risk Score and Plan: 2 and Ondansetron and Midazolam  Airway Management Planned: Oral ETT  Additional Equipment: Arterial line, CVP, PA Cath and TEE  Intra-op Plan:   Post-operative Plan: Post-operative intubation/ventilation  Informed Consent: I have reviewed the patients History and Physical, chart, labs and discussed the procedure including the risks, benefits and alternatives for the proposed anesthesia with the patient or authorized representative who has indicated his/her understanding and acceptance.       Plan Discussed with: CRNA  Anesthesia Plan Comments:        Anesthesia Quick Evaluation

## 2021-09-18 ENCOUNTER — Encounter (HOSPITAL_COMMUNITY): Payer: Self-pay | Admitting: Surgery

## 2021-09-18 ENCOUNTER — Inpatient Hospital Stay (HOSPITAL_COMMUNITY): Payer: Medicare HMO

## 2021-09-18 DIAGNOSIS — I509 Heart failure, unspecified: Secondary | ICD-10-CM | POA: Diagnosis not present

## 2021-09-18 DIAGNOSIS — R570 Cardiogenic shock: Secondary | ICD-10-CM | POA: Diagnosis not present

## 2021-09-18 DIAGNOSIS — Z95811 Presence of heart assist device: Secondary | ICD-10-CM | POA: Diagnosis not present

## 2021-09-18 LAB — BPAM FFP
Blood Product Expiration Date: 202301022359
Blood Product Expiration Date: 202301022359
Blood Product Expiration Date: 202301022359
Blood Product Expiration Date: 202301022359
Blood Product Expiration Date: 202301022359
Blood Product Expiration Date: 202301022359
ISSUE DATE / TIME: 202212280747
ISSUE DATE / TIME: 202212280747
ISSUE DATE / TIME: 202212280747
ISSUE DATE / TIME: 202212280747
ISSUE DATE / TIME: 202212281859
ISSUE DATE / TIME: 202212281859
Unit Type and Rh: 5100
Unit Type and Rh: 5100
Unit Type and Rh: 5100
Unit Type and Rh: 5100
Unit Type and Rh: 5100
Unit Type and Rh: 5100

## 2021-09-18 LAB — PREPARE FRESH FROZEN PLASMA

## 2021-09-18 LAB — POCT I-STAT 7, (LYTES, BLD GAS, ICA,H+H)
Acid-Base Excess: 0 mmol/L (ref 0.0–2.0)
Acid-base deficit: 6 mmol/L — ABNORMAL HIGH (ref 0.0–2.0)
Acid-base deficit: 3 mmol/L — ABNORMAL HIGH (ref 0.0–2.0)
Acid-base deficit: 1 mmol/L (ref 0.0–2.0)
Bicarbonate: 20.2 mmol/L (ref 20.0–28.0)
Bicarbonate: 22.9 mmol/L (ref 20.0–28.0)
Bicarbonate: 23.7 mmol/L (ref 20.0–28.0)
Bicarbonate: 23.1 mmol/L (ref 20.0–28.0)
Calcium, Ion: 1.09 mmol/L — ABNORMAL LOW (ref 1.15–1.40)
Calcium, Ion: 1.2 mmol/L (ref 1.15–1.40)
Calcium, Ion: 1.11 mmol/L — ABNORMAL LOW (ref 1.15–1.40)
Calcium, Ion: 1.09 mmol/L — ABNORMAL LOW (ref 1.15–1.40)
HCT: 17 % — ABNORMAL LOW (ref 39.0–52.0)
HCT: 20 % — ABNORMAL LOW (ref 39.0–52.0)
HCT: 23 % — ABNORMAL LOW (ref 39.0–52.0)
HCT: 25 % — ABNORMAL LOW (ref 39.0–52.0)
Hemoglobin: 5.8 g/dL — CL (ref 13.0–17.0)
Hemoglobin: 6.8 g/dL — CL (ref 13.0–17.0)
Hemoglobin: 7.8 g/dL — ABNORMAL LOW (ref 13.0–17.0)
Hemoglobin: 8.5 g/dL — ABNORMAL LOW (ref 13.0–17.0)
O2 Saturation: 99 %
O2 Saturation: 100 %
O2 Saturation: 99 %
O2 Saturation: 99 %
Patient temperature: 35.7
Patient temperature: 36.6
Patient temperature: 37.4
Patient temperature: 37.4
Potassium: 3.4 mmol/L — ABNORMAL LOW (ref 3.5–5.1)
Potassium: 4.3 mmol/L (ref 3.5–5.1)
Potassium: 4.8 mmol/L (ref 3.5–5.1)
Potassium: 4.8 mmol/L (ref 3.5–5.1)
Sodium: 139 mmol/L (ref 135–145)
Sodium: 134 mmol/L — ABNORMAL LOW (ref 135–145)
Sodium: 134 mmol/L — ABNORMAL LOW (ref 135–145)
Sodium: 133 mmol/L — ABNORMAL LOW (ref 135–145)
TCO2: 21 mmol/L — ABNORMAL LOW (ref 22–32)
TCO2: 24 mmol/L (ref 22–32)
TCO2: 25 mmol/L (ref 22–32)
TCO2: 24 mmol/L (ref 22–32)
pCO2 arterial: 37.7 mmHg (ref 32.0–48.0)
pCO2 arterial: 41 mmHg (ref 32.0–48.0)
pCO2 arterial: 35.2 mmHg (ref 32.0–48.0)
pCO2 arterial: 35.6 mmHg (ref 32.0–48.0)
pH, Arterial: 7.33 — ABNORMAL LOW (ref 7.350–7.450)
pH, Arterial: 7.353 (ref 7.350–7.450)
pH, Arterial: 7.438 (ref 7.350–7.450)
pH, Arterial: 7.422 (ref 7.350–7.450)
pO2, Arterial: 148 mmHg — ABNORMAL HIGH (ref 83.0–108.0)
pO2, Arterial: 177 mmHg — ABNORMAL HIGH (ref 83.0–108.0)
pO2, Arterial: 134 mmHg — ABNORMAL HIGH (ref 83.0–108.0)
pO2, Arterial: 125 mmHg — ABNORMAL HIGH (ref 83.0–108.0)

## 2021-09-18 LAB — CBC
HCT: 27.3 % — ABNORMAL LOW (ref 39.0–52.0)
HCT: 21.8 % — ABNORMAL LOW (ref 39.0–52.0)
HCT: 22.3 % — ABNORMAL LOW (ref 39.0–52.0)
Hemoglobin: 9.4 g/dL — ABNORMAL LOW (ref 13.0–17.0)
Hemoglobin: 7.6 g/dL — ABNORMAL LOW (ref 13.0–17.0)
Hemoglobin: 8.3 g/dL — ABNORMAL LOW (ref 13.0–17.0)
MCH: 30.8 pg (ref 26.0–34.0)
MCH: 29.2 pg (ref 26.0–34.0)
MCH: 31.1 pg (ref 26.0–34.0)
MCHC: 34.4 g/dL (ref 30.0–36.0)
MCHC: 34.9 g/dL (ref 30.0–36.0)
MCHC: 37.2 g/dL — ABNORMAL HIGH (ref 30.0–36.0)
MCV: 89.5 fL (ref 80.0–100.0)
MCV: 83.8 fL (ref 80.0–100.0)
MCV: 83.5 fL (ref 80.0–100.0)
Platelets: 69 10*3/uL — ABNORMAL LOW (ref 150–400)
Platelets: 113 10*3/uL — ABNORMAL LOW (ref 150–400)
Platelets: 118 10*3/uL — ABNORMAL LOW (ref 150–400)
RBC: 3.05 MIL/uL — ABNORMAL LOW (ref 4.22–5.81)
RBC: 2.6 MIL/uL — ABNORMAL LOW (ref 4.22–5.81)
RBC: 2.67 MIL/uL — ABNORMAL LOW (ref 4.22–5.81)
RDW: 16.8 % — ABNORMAL HIGH (ref 11.5–15.5)
RDW: 16.7 % — ABNORMAL HIGH (ref 11.5–15.5)
RDW: 16.7 % — ABNORMAL HIGH (ref 11.5–15.5)
WBC: 18.6 10*3/uL — ABNORMAL HIGH (ref 4.0–10.5)
WBC: 12.3 10*3/uL — ABNORMAL HIGH (ref 4.0–10.5)
WBC: 13.8 10*3/uL — ABNORMAL HIGH (ref 4.0–10.5)
nRBC: 0.1 % (ref 0.0–0.2)
nRBC: 0.2 % (ref 0.0–0.2)
nRBC: 0 % (ref 0.0–0.2)

## 2021-09-18 LAB — COOXEMETRY PANEL
Carboxyhemoglobin: 1.4 % (ref 0.5–1.5)
Methemoglobin: 1.1 % (ref 0.0–1.5)
O2 Saturation: 56.4 %
Total hemoglobin: 6.7 g/dL — CL (ref 12.0–16.0)

## 2021-09-18 LAB — BASIC METABOLIC PANEL
Anion gap: 11 (ref 5–15)
BUN: 34 mg/dL — ABNORMAL HIGH (ref 8–23)
CO2: 23 mmol/L (ref 22–32)
Calcium: 7.9 mg/dL — ABNORMAL LOW (ref 8.9–10.3)
Chloride: 99 mmol/L (ref 98–111)
Creatinine, Ser: 2.2 mg/dL — ABNORMAL HIGH (ref 0.61–1.24)
GFR, Estimated: 32 mL/min — ABNORMAL LOW (ref >60)
Glucose, Bld: 120 mg/dL — ABNORMAL HIGH (ref 70–99)
Potassium: 4.8 mmol/L (ref 3.5–5.1)
Sodium: 133 mmol/L — ABNORMAL LOW (ref 135–145)

## 2021-09-18 LAB — CBC WITH DIFFERENTIAL/PLATELET
Abs Immature Granulocytes: 0.1 10*3/uL — ABNORMAL HIGH (ref 0.00–0.07)
Basophils Absolute: 0 10*3/uL (ref 0.0–0.1)
Basophils Relative: 0 %
Eosinophils Absolute: 1.2 10*3/uL — ABNORMAL HIGH (ref 0.0–0.5)
Eosinophils Relative: 8 %
HCT: 18.8 % — ABNORMAL LOW (ref 39.0–52.0)
Hemoglobin: 6.5 g/dL — CL (ref 13.0–17.0)
Immature Granulocytes: 1 %
Lymphocytes Relative: 11 %
Lymphs Abs: 1.6 10*3/uL (ref 0.7–4.0)
MCH: 31 pg (ref 26.0–34.0)
MCHC: 34.6 g/dL (ref 30.0–36.0)
MCV: 89.5 fL (ref 80.0–100.0)
Monocytes Absolute: 1.5 10*3/uL — ABNORMAL HIGH (ref 0.1–1.0)
Monocytes Relative: 11 %
Neutro Abs: 9.7 10*3/uL — ABNORMAL HIGH (ref 1.7–7.7)
Neutrophils Relative %: 69 %
Platelets: 116 10*3/uL — ABNORMAL LOW (ref 150–400)
RBC: 2.1 MIL/uL — ABNORMAL LOW (ref 4.22–5.81)
RDW: 17.5 % — ABNORMAL HIGH (ref 11.5–15.5)
WBC: 14.1 10*3/uL — ABNORMAL HIGH (ref 4.0–10.5)
nRBC: 0 % (ref 0.0–0.2)

## 2021-09-18 LAB — DIC (DISSEMINATED INTRAVASCULAR COAGULATION)PANEL
D-Dimer, Quant: 4.11 ug/mL-FEU — ABNORMAL HIGH (ref 0.00–0.50)
Fibrinogen: 237 mg/dL (ref 210–475)
INR: 1.7 — ABNORMAL HIGH (ref 0.8–1.2)
Platelets: 69 10*3/uL — ABNORMAL LOW (ref 150–400)
Prothrombin Time: 20.3 seconds — ABNORMAL HIGH (ref 11.4–15.2)
Smear Review: NONE SEEN
aPTT: 56 seconds — ABNORMAL HIGH (ref 24–36)

## 2021-09-18 LAB — BPAM PLATELET PHERESIS
Blood Product Expiration Date: 202212282359
ISSUE DATE / TIME: 202212281859
Unit Type and Rh: 6200

## 2021-09-18 LAB — COMPREHENSIVE METABOLIC PANEL
ALT: 26 U/L (ref 0–44)
AST: 122 U/L — ABNORMAL HIGH (ref 15–41)
Albumin: 2.6 g/dL — ABNORMAL LOW (ref 3.5–5.0)
Alkaline Phosphatase: 49 U/L (ref 38–126)
Anion gap: 11 (ref 5–15)
BUN: 31 mg/dL — ABNORMAL HIGH (ref 8–23)
CO2: 21 mmol/L — ABNORMAL LOW (ref 22–32)
Calcium: 8 mg/dL — ABNORMAL LOW (ref 8.9–10.3)
Chloride: 98 mmol/L (ref 98–111)
Creatinine, Ser: 2.11 mg/dL — ABNORMAL HIGH (ref 0.61–1.24)
GFR, Estimated: 33 mL/min — ABNORMAL LOW (ref >60)
Glucose, Bld: 118 mg/dL — ABNORMAL HIGH (ref 70–99)
Potassium: 4.2 mmol/L (ref 3.5–5.1)
Sodium: 130 mmol/L — ABNORMAL LOW (ref 135–145)
Total Bilirubin: 3.1 mg/dL — ABNORMAL HIGH (ref 0.3–1.2)
Total Protein: 4.7 g/dL — ABNORMAL LOW (ref 6.5–8.1)

## 2021-09-18 LAB — PHOSPHORUS: Phosphorus: 3.3 mg/dL (ref 2.5–4.6)

## 2021-09-18 LAB — PREPARE PLATELET PHERESIS: Unit division: 0

## 2021-09-18 LAB — GLUCOSE, CAPILLARY
Glucose-Capillary: 93 mg/dL (ref 70–99)
Glucose-Capillary: 91 mg/dL (ref 70–99)
Glucose-Capillary: 127 mg/dL — ABNORMAL HIGH (ref 70–99)
Glucose-Capillary: 120 mg/dL — ABNORMAL HIGH (ref 70–99)
Glucose-Capillary: 107 mg/dL — ABNORMAL HIGH (ref 70–99)
Glucose-Capillary: 115 mg/dL — ABNORMAL HIGH (ref 70–99)
Glucose-Capillary: 139 mg/dL — ABNORMAL HIGH (ref 70–99)
Glucose-Capillary: 125 mg/dL — ABNORMAL HIGH (ref 70–99)
Glucose-Capillary: 122 mg/dL — ABNORMAL HIGH (ref 70–99)
Glucose-Capillary: 85 mg/dL (ref 70–99)
Glucose-Capillary: 74 mg/dL (ref 70–99)
Glucose-Capillary: 55 mg/dL — ABNORMAL LOW (ref 70–99)

## 2021-09-18 LAB — MAGNESIUM
Magnesium: 2.2 mg/dL (ref 1.7–2.4)
Magnesium: 2.3 mg/dL (ref 1.7–2.4)

## 2021-09-18 LAB — HEMOGLOBIN AND HEMATOCRIT, BLOOD
HCT: 24 % — ABNORMAL LOW (ref 39.0–52.0)
Hemoglobin: 8.5 g/dL — ABNORMAL LOW (ref 13.0–17.0)

## 2021-09-18 LAB — PROTIME-INR
INR: 1.6 — ABNORMAL HIGH (ref 0.8–1.2)
Prothrombin Time: 18.9 seconds — ABNORMAL HIGH (ref 11.4–15.2)

## 2021-09-18 LAB — LACTATE DEHYDROGENASE: LDH: 344 U/L — ABNORMAL HIGH (ref 98–192)

## 2021-09-18 LAB — PREPARE RBC (CROSSMATCH)

## 2021-09-18 LAB — BRAIN NATRIURETIC PEPTIDE: B Natriuretic Peptide: 611.9 pg/mL — ABNORMAL HIGH (ref 0.0–100.0)

## 2021-09-18 LAB — SURGICAL PATHOLOGY

## 2021-09-18 MED ORDER — DEXTROSE 50 % IV SOLN
INTRAVENOUS | Status: AC
  Administered 2021-09-18: 23:00:00 25 mL
  Filled 2021-09-18: qty 50

## 2021-09-18 MED ORDER — PANTOPRAZOLE 2 MG/ML SUSPENSION
40.0000 mg | Freq: Every day | ORAL | Status: DC
  Administered 2021-09-18: 10:00:00 40 mg
  Filled 2021-09-18: qty 20

## 2021-09-18 MED ORDER — DOCUSATE SODIUM 50 MG/5ML PO LIQD
200.0000 mg | Freq: Every day | ORAL | Status: DC
Start: 2021-09-18 — End: 2021-09-19
  Administered 2021-09-18: 10:00:00 200 mg
  Filled 2021-09-18: qty 20

## 2021-09-18 MED ORDER — INSULIN ASPART 100 UNIT/ML IJ SOLN
0.0000 [IU] | INTRAMUSCULAR | Status: DC
Start: 2021-09-18 — End: 2021-09-25
  Administered 2021-09-18 (×2): 2 [IU] via SUBCUTANEOUS
  Administered 2021-09-20: 3 [IU] via SUBCUTANEOUS
  Administered 2021-09-22 – 2021-09-23 (×4): 2 [IU] via SUBCUTANEOUS

## 2021-09-18 MED ORDER — SODIUM CHLORIDE 0.9% IV SOLUTION: Freq: Once | INTRAVENOUS | Status: AC

## 2021-09-18 MED ORDER — OXYCODONE HCL 5 MG PO TABS: 5.0000 mg | ORAL_TABLET | ORAL | Status: DC | PRN

## 2021-09-18 MED ORDER — INSULIN DETEMIR 100 UNIT/ML ~~LOC~~ SOLN
20.0000 [IU] | Freq: Every day | SUBCUTANEOUS | Status: DC
  Administered 2021-09-18: 09:00:00 20 [IU] via SUBCUTANEOUS
  Filled 2021-09-18 (×2): qty 0.2

## 2021-09-18 MED ORDER — ORAL CARE MOUTH RINSE
15.0000 mL | Freq: Two times a day (BID) | OROMUCOSAL | Status: DC
  Administered 2021-09-18 – 2021-10-23 (×51): 15 mL via OROMUCOSAL

## 2021-09-18 MED ORDER — ADULT MULTIVITAMIN LIQUID CH
15.0000 mL | Freq: Every day | ORAL | Status: DC
  Administered 2021-09-18: 15 mL
  Filled 2021-09-18 (×2): qty 15

## 2021-09-18 MED ORDER — ALBUMIN HUMAN 5 % IV SOLN
12.5000 g | Freq: Once | INTRAVENOUS | Status: AC
  Administered 2021-09-18: 13:00:00 12.5 g via INTRAVENOUS
  Filled 2021-09-18: qty 250

## 2021-09-18 MED ORDER — TRAMADOL HCL 50 MG PO TABS: 50.0000 mg | ORAL_TABLET | ORAL | Status: DC | PRN

## 2021-09-18 MED ORDER — ENSURE ENLIVE PO LIQD: 237.0000 mL | Freq: Three times a day (TID) | ORAL | Status: DC

## 2021-09-18 MED ORDER — MONTELUKAST SODIUM 10 MG PO TABS: 5.0000 mg | ORAL_TABLET | Freq: Every day | ORAL | Status: DC

## 2021-09-18 MED ORDER — ROSUVASTATIN CALCIUM 5 MG PO TABS
10.0000 mg | ORAL_TABLET | Freq: Every day | ORAL | Status: DC
  Administered 2021-09-18: 10:00:00 10 mg
  Filled 2021-09-18 (×2): qty 2

## 2021-09-18 MED ORDER — SODIUM BICARBONATE 8.4 % IV SOLN
50.0000 meq | Freq: Once | INTRAVENOUS | Status: AC
  Administered 2021-09-18: 05:00:00 50 meq via INTRAVENOUS

## 2021-09-18 MED FILL — Lidocaine HCl Local Preservative Free (PF) Inj 2%: INTRAMUSCULAR | Qty: 15 | Status: AC

## 2021-09-18 MED FILL — Heparin Sodium (Porcine) Inj 1000 Unit/ML: Qty: 1000 | Status: AC

## 2021-09-18 MED FILL — Thrombin (Recombinant) For Soln 20000 Unit: CUTANEOUS | Qty: 1 | Status: AC

## 2021-09-18 MED FILL — Potassium Chloride Inj 2 mEq/ML: INTRAVENOUS | Qty: 40 | Status: AC

## 2021-09-18 NOTE — Progress Notes (Addendum)
Patient ID: Isaac [name], male   DOB: 06/12/1951, 70 y.o.   MRN: [id number]   Advanced Heart Failure VAD Team Note  PCP-Cardiologist: None   Subjective:    12/19 RHC- RA 7, PA 39/14 (25), PCWP 11, CO 6.4 CI 3. Thermo 3.5 1.8. Lasix drip stopped after cath. 12/20 Swan removed.  Norepi 3 mcg added.  12/21 Fever. Blood CX drawn. UA + leukocytes. Started on vanc and cefepime. Diuresed with IV lasix + metolazone.  12/23 Impella 5.5 placed 12/28 HM III LVAD, ICD leads plastered to tricuspid valve with severe TR, valve not replaced and some improvement in TR with LVAD placement.   On milrinone 0.25, 19 NE, 0.04 vaso, 14 Epi. Weaning off NO.   Creatinine higher at 2.1.  UOP 1010 cc. I/Os mildly positive with weight up about 12 lbs from pre-op.   SWAN #s CVP 11 PA 21/11 CI 2.5 Co-ox 56%  There was large hematoma at Impella 5.5 site, JP drain placed after removal with significant output.  Hgb 6.5 this morning.   LVAD INTERROGATION:  HeartMate III LVAD:   Flow 3.6 liters/min, speed 5300, power 3.7, PI 2.1.   LDH 344  Objective:    Vital Signs:   Temp:  [96.3 F (35.7 C)-98.6 F (37 C)] 97.9 F (36.6 C) (12/29 0720) Pulse Rate:  [30-193] 113 (12/29 0720) Resp:  [0-27] 6 (12/29 0720) BP: (74-98)/(50-73) 88/59 (12/29 0700) SpO2:  [74 %-100 %] 100 % (12/29 0720) Arterial Line BP: (72-114)/(48-82) 90/59 (12/29 0720) FiO2 (%):  [50 %] 50 % (12/29 0630) Weight:  [84 kg] 84 kg (12/29 0500) Last BM Date: 09/10/21 Mean arterial Pressure  82  Intake/Output:   Intake/Output Summary (Last 24 hours) at 09/18/2021 0735 Last data filed at 09/18/2021 0703 Gross per 24 hour  Intake 8880.26 ml  Output 6681 ml  Net 2199.26 ml     Physical Exam    General: Intubated/sedated.  HEENT: Swan present left IJ Neck: Supple, JVP 10 cm. Carotids OK.  Cardiac:  Mechanical heart sounds with LVAD hum present.  Lungs:  Decreased at bases.  Abdomen:  NT, ND, no HSM. No bruits or masses. +BS   LVAD exit site: Well-healed and incorporated. Dressing dry and intact. No erythema or drainage. Stabilization device present and accurately applied. Driveline dressing changed daily per sterile technique. Extremities:  Warm and dry. No cyanosis, clubbing, rash, or edema.  Neuro:  Will awaken/follow commands.   Telemetry   NSR 70s (personally reviewed)   Labs   Basic Metabolic Panel: Recent Labs  Lab 09/16/21 0415 09/16/21 0515 09/16/21 1825 09/17/21 0332 09/17/21 0610 09/17/21 1056 09/17/21 1134 09/17/21 1202 09/17/21 1231 09/17/21 1317 09/17/21 1555 09/17/21 1605 09/17/21 2057 09/17/21 2106 09/17/21 2238 09/18/21 0407 09/18/21 0451  NA 133*   < > 133* 136   < > 134*   134*   < > 133*   133*   < > 134*   134* 132* 134*  --  136 139 130* 134*  K 4.1   < > 3.7 4.3   < > 4.2   4.2   < > 4.4   4.4   < > 3.8   3.8 3.4* 3.6 4.0 3.9 3.4* 4.2 4.3  CL 96*  --  96* 97*   < > 96*  --  94*  --  97* 97*  --   --   --   --  98  --   CO2 29  --  28 29  --   --   --   --   --   --  27  --   --   --   --  21*  --   GLUCOSE 110*  --  116* 106*   < > 198*  --  234*  --  172* 114*  --   --   --   --  118*  --   BUN 35*  --  32* 34*   < > 29*  --  32*  --  30* 29*  --   --   --   --  31*  --   CREATININE 1.71*  --  1.62* 1.73*   < > 1.60*  --  1.50*  --  1.60* 1.65*  --  1.75*  --   --  2.11*  --   CALCIUM 8.7*  --  8.6* 9.0  --   --   --   --   --   --  8.4*  --   --   --   --  8.0*  --   MG 2.1  --   --  2.1  --   --   --   --   --   --  1.5*  --  2.3  --   --  2.2  --   PHOS  --   --   --   --   --   --   --   --   --   --   --   --   --   --   --  3.3  --    < > = values in this interval not displayed.    Liver Function Tests: Recent Labs  Lab 09/18/21 0407  AST 122*  ALT 26  ALKPHOS 49  BILITOT 3.1*  PROT 4.7*  ALBUMIN 2.6*   No results for input(s): LIPASE, AMYLASE in the last 168 hours. No results for input(s): AMMONIA in the last 168 hours.  CBC: Recent Labs  Lab  09/16/21 0415 09/16/21 0515 09/17/21 0332 09/17/21 0610 09/17/21 1134 09/17/21 1202 09/17/21 1555 09/17/21 1605 09/17/21 2057 09/17/21 2106 09/17/21 2238 09/18/21 0407 09/18/21 0451  WBC 7.9  --  8.1  --   --   --  18.6*  --  17.0*  --   --  14.1*  --   NEUTROABS  --   --   --   --   --   --   --   --   --   --   --  9.7*  --   HGB 8.7*   < > 9.7*   < > 6.1*   7.5*   < > 9.4*   < > 7.6* 7.5* 5.8* 6.5* 6.8*  HCT 26.1*   < > 29.4*   < > 17.8*   22.0*   < > 27.3*   < > 22.9* 22.0* 17.0* 18.8* 20.0*  MCV 89.1  --  89.9  --   --   --  89.5  --  90.9  --   --  89.5  --   PLT 149*  --  173  --  125*  --  69*   69*  --  82*  --   --  116*  --    < > = values in this interval not displayed.    INR: Recent Labs  Lab 09/17/21 1555 09/18/21 0407  INR 1.7*   1.8* 1.6*    Other results: EKG:    Imaging   DG Chest Port 1 View  Result Date: 09/17/2021 CLINICAL DATA:  Implantable left ventricular assist device EXAM: PORTABLE CHEST 1 VIEW COMPARISON:  Radiograph 09/12/2021 FINDINGS: Interval median sternotomy with LVAD implantation. Endotracheal tube overlies the upper thoracic trachea. Pulmonary artery catheter tip overlies the right pulmonary artery. Unchanged pacemaker/AICD leads. Bilateral chest tubes are in place. There is a mediastinal drain in place. Orogastric tube side port overlies the gastroesophageal junction. Impella device has been removed. Mildly enlarged cardiac silhouette. Mild diffuse interstitial opacities. No large pleural effusion. No visible pneumothorax. Right axillary surgical clips. IMPRESSION: Postoperative chest after LVAD placement. Lines and tubes as described above, of note the orogastric tube side port overlies the gastroesophageal junction, recommend advancement. Mild pulmonary edema. Electronically Signed   By: Maurine Simmering M.D.   On: 09/17/2021 16:33   ECHO INTRAOPERATIVE TEE  Result Date: 09/17/2021  *INTRAOPERATIVE TRANSESOPHAGEAL REPORT *  Patient Name:    Isaac [name] Date of Exam: 09/17/2021 Medical Rec #:  [id number]           Height:       69.0 in Accession #:    [id number]          Weight:       173.1 lb Date of Birth:  Jun 02, 1951            BSA:          1.94 m Patient Age:    17 years            BP:           113/97 mmHg Patient Gender: M                   HR:           107 bpm. Exam Location:  Anesthesiology Transesophogeal exam was perform intraoperatively during surgical procedure. Patient was closely monitored under general anesthesia during the entirety of examination. Indications:     CAD Native Vessel i25.10 Sonographer:     Raquel Sarna Senior RDCS Performing Phys: Suella Broad MD Diagnosing Phys: Suella Broad MD Complications: No known complications during this procedure.                            POST-OP IMPRESSIONS s/p Insertion of Implantable Left Ventricular Assist Device (Heartmate 3) _ Left Ventricle: Heartmate 3 inflow cannula present. Cannula oriented away from the septal wall. Appears to be stable and functioning well. _ Right Ventricle: RV wall motion and function improved. AICD wires present. New PA catheter traversing the RV into the main pulmonary artery. _ Aorta: No dissection noted after cannula removed. Impella no longer present. _ Left Atrium: The left atrium appears unchanged from pre-bypass. _ Left Atrial Appendage: The left atrial appendage appears unchanged from pre-bypass. _ Aortic Valve: Impella no longer present. No significant AI present. Normal valve motion and function. _ Mitral Valve: MR slightly improved. Normal leaflet function. _ Tricuspid Valve: TR mildly improved, normal leaflet function. _ Pulmonic Valve: The pulmonic valve appears unchanged from pre-bypass. _ Interatrial Septum: The interatrial septum appears unchanged from pre-bypass. _ Interventricular Septum: The interventricular septum appears unchanged from pre-bypass. _ Pericardium: The pericardium appears unchanged from pre-bypass. PRE-OP FINDINGS  Left  Ventricle: The left ventricle is less than 20%. The cavity size was severely dilated. Left ventricular diffusely hypokinetic. There is no left  ventricular hypertrophy. Left ventricular diastolic function could not be evaluated. Well positioned Impella device noted in the LV ventricle. Right Ventricle: The right ventricle has severely reduced systolic function. The cavity was dilated. There is no increase in right ventricular wall thickness. There is no aneurysm seen. AICD wires and PA catheter present. Left Atrium: Left atrial size was dilated. No left atrial/left atrial appendage thrombus was detected. Left atrial appendage velocity is reduced at less than 40 cm/s. Right Atrium: Right atrial size was dilated. AICD wires and PA catheter present traversing the right atrium. Interatrial Septum: No atrial level shunt detected by color flow Doppler. Agitated saline contrast bubble study was negative, with no evidence of any interatrial shunt. There is no evidence of a patent foramen ovale. Pericardium: There is no evidence of pericardial effusion. There is a moderate pleural effusion in the left lateral region. Mitral Valve: The mitral valve is normal in structure. Mitral valve regurgitation is moderate by color flow Doppler. The MR jet is centrally-directed. There is no evidence of mitral valve vegetation. There is no evidence of mitral stenosis. Tricuspid Valve: The tricuspid valve was normal in structure. Tricuspid valve regurgitation is severe by color flow Doppler. The jet is directed eccentrically and could be related to ICD wires and PA catheter. No evidence of tricuspid stenosis is present. There is no evidence of tricuspid valve vegetation. Aortic Valve: The aortic valve is tricuspid. Aortic valve regurgitation is trivial by color flow Doppler and likely related to well positioned Impella device. The jet is centrally-directed. There is no stenosis of the aortic valve. There is no evidence of aortic valve  vegetation. Pulmonic Valve: The pulmonic valve was normal in structure. Pulmonic valve regurgitation is trivial by color flow Doppler. Aorta: The aortic root and ascending aorta are normal in size and structure. There is evidence of protruding and layered plaque in the descending aorta; Grade II, measuring 2-53m in size. Pulmonary Artery: The pulmonary artery is of normal size. PA catheter present. Venous: The inferior vena cava was not well visualized. Shunts: There is no evidence of an atrial septal defect. +--------------+-------++  LEFT VENTRICLE           +--------------+-------++  PLAX 2D                  +--------------+-------++  LVIDd:         7.10 cm   +--------------+-------++  LVIDs:         6.40 cm   +--------------+-------++  LV SV:         55 ml     +--------------+-------++  LV SV Index:   28.08     +--------------+-------++                           +--------------+-------++  KSuella BroadMD Electronically signed by KSuella BroadMD Signature Date/Time: 09/17/2021/3:58:48 PM    Final    ECHOCARDIOGRAM LIMITED  Result Date: 09/16/2021    ECHOCARDIOGRAM LIMITED REPORT   Patient Name:   WJAKARRI LESKODate of Exam: 09/16/2021 Medical Rec #:  [id number]          Height:       69.0 in Accession #:    [id number]         Weight:       170.9 lb Date of Birth:  708-27-1952           BSA:  1.932 m Patient Age:    77 years            BP:           93/75 mmHg Patient Gender: M                   HR:           95 bpm. Exam Location:  Inpatient Procedure: Limited Echo Indications:    impella placement check  History:        Patient has prior history of Echocardiogram examinations, most                 recent 08/23/2021.  Sonographer:    Johny Chess RDCS Referring Phys: [place]  1. Limited echo for impella posiition. Device approximately 4.95 cm distal to the aortic annulus.  2. Left ventricular ejection fraction, by estimation, is 20 to 25%. The left ventricle has  severely decreased function. The left ventricle demonstrates global hypokinesis. The left ventricular internal cavity size was moderately dilated.  3. Left atrial size was moderately dilated.  4. Right atrial size was moderately dilated. FINDINGS  Left Ventricle: Left ventricular ejection fraction, by estimation, is 20 to 25%. The left ventricle has severely decreased function. The left ventricle demonstrates global hypokinesis. The left ventricular internal cavity size was moderately dilated. Left Atrium: Left atrial size was moderately dilated. Right Atrium: Right atrial size was moderately dilated. Pericardium: There is no evidence of pericardial effusion. Additional Comments: Limited echo for impella posiition. Device approximately 4.95 cm distal to the aortic annulus. Jenkins Rouge MD Electronically signed by Jenkins Rouge MD Signature Date/Time: 09/16/2021/12:04:02 PM    Final      Medications:     Scheduled Medications:  sodium chloride   Intravenous Once   sodium chloride   Intravenous Once   acetaminophen  1,000 mg Oral Q6H   Or   acetaminophen (TYLENOL) oral liquid 160 mg/5 mL  1,000 mg Per Tube Q6H   aspirin EC  325 mg Oral Daily   Or   aspirin  324 mg Per Tube Daily   Or   aspirin  300 mg Rectal Daily   bisacodyl  10 mg Oral Daily   Or   bisacodyl  10 mg Rectal Daily   chlorhexidine gluconate (MEDLINE KIT)  15 mL Mouth Rinse BID   Chlorhexidine Gluconate Cloth  6 each Topical Daily   docusate sodium  200 mg Oral Daily   feeding supplement  237 mL Oral TID BM   mouth rinse  15 mL Mouth Rinse 10 times per day   montelukast  5 mg Oral QHS   multivitamin with minerals  1 tablet Oral Daily   [START ON 09/19/2021] pantoprazole  40 mg Oral Daily   rosuvastatin  10 mg Oral Daily   sodium chloride flush  10-40 mL Intracatheter Q12H   sodium chloride flush  3 mL Intravenous Q12H    Infusions:  sodium chloride 20 mL/hr at 09/18/21 0700   sodium chloride     sodium chloride 20 mL/hr  at 09/17/21 1530   amiodarone 30 mg/hr (09/18/21 0700)    ceFAZolin (ANCEF) IV Stopped (09/18/21 0543)   dexmedetomidine (PRECEDEX) IV infusion 0.2 mcg/kg/hr (09/18/21 0700)   epinephrine 15 mcg/min (09/18/21 0700)   insulin 0.6 Units/hr (09/18/21 0700)   lactated ringers     lactated ringers     lactated ringers 20 mL/hr at 09/17/21 1530   milrinone 0.25 mcg/kg/min (  09/18/21 0700)   norepinephrine (LEVOPHED) Adult infusion 23 mcg/min (09/18/21 0700)   rifampin (RIFADIN) IVPB     vancomycin     vasopressin 0.04 Units/min (09/18/21 0700)    PRN Medications: sodium chloride, albuterol, dextrose, lactated ringers, midazolam, morphine injection, ondansetron (ZOFRAN) IV, oxyCODONE, sodium chloride flush, sodium chloride flush, traMADol   Assessment/Plan:    1. Acute on chronic systolic CHF:  Long-standing cardiomyopathy.  [place].  Echo this admission with EF < 20%, severe LV dilation, restrictive diastolic function, moderate RV dysfunction, moderate MR, mod-severe TR. Cause of cardiomyopathy is uncertain.  He has a history of CAD, but I do not think that the described CAD from the past could explain his cardiomyopathy, but CAD could have progressed.   -With difficulty tolerating GDMT/need for midodrine and cardiorenal syndrome as well as profound volume overload + NYHA class IV symptoms,  concerned for low output HF. Co-ox off milrinone was 36%, milrinone started and increased to 0.375 but CO remained low. NE added and Impella 5.5 placed 12/23. EF 10% on TEE 12/23. s/p HM III VAD on 12/28.  Currently on NE 19, epinephrine 14, milrinone 0.25, vasopressin 0.04.  Weaning off NO.  CI 2.5 with co-ox 56%, CVP 11.   - Keep LVAD speed 5300 until he has had 2 units PRBCs, then will reassess and possibly increase back to 5400 rpm.  - Wean NE and epinephrine carefully.  - No anticoagulation yet.  - No diuresis yet.  2. Tricuspid regurgitation: Has moderate-severe TR.  Tricuspid repair not  done at time of VAD d/t proximity of ICD wires 3. Atrial fibrillation: Persistent since 10/22 based on device interrogation.  Looks like probably NSR this morning.  - ECG - Continue amiodarone gtt.  4. AKI on CKD stage 3: Creatinine 2.1, mildly higher this morning.    - Continue hemodynamic support. - Will get volume with 2 units PRBCs.  5. CAD: History of PCI to OM1 in 2007 and RCA in 2013.  No chest pain or ACS.  - Deferred cardiac cath due to AKI and plan for VAD - Continue Crestor.  6. ID: Had fever 12/21, PCT 1.03.  Has been afebrile since with normal WBCs.  Blood cultures negative. ?Phlebitis at IV site. Completed course of abx.  7. L Pleural Effusion: Noted on CT.  8. Hyponatremia: Hypervolemic hyponatremia.  Na 130 today.    9. Anemia: Blood loss anemia, had hematoma at Impella 5.5 site with significant drainage after removal. Hgb 6.5 today.  - To get 2 units PRBCs today.  - Follow CBC.  10. Acute hypoxemic respiratory failure: Intubated post-op.  Weaning vent and NO today.  - Hopefully extubate soon.   I reviewed the LVAD parameters from today, and compared the results to the patient's prior recorded data.  No programming changes were made.  The LVAD is functioning within specified parameters.  The patient performs LVAD self-test daily.  LVAD interrogation was negative for any significant power changes, alarms or PI events/speed drops.  LVAD equipment check completed and is in good working order.  Back-up equipment present.   LVAD education done on emergency procedures and precautions and reviewed exit site care.  CRITICAL CARE Performed by: Loralie Champagne  Total critical care time: 40 minutes  Critical care time was exclusive of separately billable procedures and treating other patients.  Critical care was necessary to treat or prevent imminent or life-threatening deterioration.  Critical care was time spent personally by me on the following activities:  development of treatment  plan with patient and/or surrogate as well as nursing, discussions with consultants, evaluation of patient's response to treatment, examination of patient, obtaining history from patient or surrogate, ordering and performing treatments and interventions, ordering and review of laboratory studies, ordering and review of radiographic studies, pulse oximetry and re-evaluation of patient's condition.  Loralie Champagne 09/18/2021 7:35 AM

## 2021-09-18 NOTE — Progress Notes (Signed)
Pt NO decreased to 15ppm from 20ppm per weaning protocol. RT will cont to monitor.

## 2021-09-18 NOTE — Progress Notes (Signed)
Spoke with Rod Holler at [facility] regarding prior authorization for VAD. Per Rod Holler LVAD implant [phone]) is covered hospitalization authorization. Hospital authorization #: [id number]. Call reference # [id number].  Emerson Monte RN [place] Coordinator  Office: [phone]  24/7 Pager: [phone]

## 2021-09-18 NOTE — Progress Notes (Signed)
Patient ID: Isaac [name], male   DOB: [birth year], 70 y.o.   MRN: [id number] HeartMate 3 Rounding Note  Subjective:    Hemodynamics stable overnight. CI 2.5, CVP 13, PA 24/14 Co-ox 56% this am on milrinone 0.25, epi 15, NE 23, vaso 0.04   LVAD INTERROGATION:  HeartMate IIl LVAD:  Flow 3.7 liters/min, speed 5300, power 4, PI 1.8.   Objective:    Vital Signs:   Temp:  [96.3 F (35.7 C)-98.6 F (37 C)] 97.9 F (36.6 C) (12/29 0720) Pulse Rate:  [30-193] 113 (12/29 0720) Resp:  [0-27] 6 (12/29 0720) BP: (74-98)/(50-73) 88/59 (12/29 0700) SpO2:  [74 %-100 %] 100 % (12/29 0720) Arterial Line BP: (72-114)/(48-82) 90/59 (12/29 0720) FiO2 (%):  [50 %] 50 % (12/29 0630) Weight:  [84 kg] 84 kg (12/29 0500) Last BM Date: 09/10/21 Mean arterial Pressure 79  Intake/Output:   Intake/Output Summary (Last 24 hours) at 09/18/2021 0738 Last data filed at 09/18/2021 0703 Gross per 24 hour  Intake 8880.26 ml  Output 6681 ml  Net 2199.26 ml     Physical Exam: General:  intubated on vent. Precedex just turned off Cor: Distant heart sounds with LVAD hum present. Lungs: clear Abdomen: soft, nontender, nondistended. No bowel sounds. Extremities: moderate edema Neuro: starting to wake up and follow commands. Squeezes with both hands.  Telemetry: sinus 60's  Labs: Basic Metabolic Panel: Recent Labs  Lab 09/16/21 0415 09/16/21 0515 09/16/21 1825 09/17/21 0332 09/17/21 0610 09/17/21 1056 09/17/21 1134 09/17/21 1202 09/17/21 1231 09/17/21 1317 09/17/21 1555 09/17/21 1605 09/17/21 2057 09/17/21 2106 09/17/21 2238 09/18/21 0407 09/18/21 0451  NA 133*   < > 133* 136   < > 134*   134*   < > 133*   133*   < > 134*   134* 132* 134*  --  136 139 130* 134*  K 4.1   < > 3.7 4.3   < > 4.2   4.2   < > 4.4   4.4   < > 3.8   3.8 3.4* 3.6 4.0 3.9 3.4* 4.2 4.3  CL 96*  --  96* 97*   < > 96*  --  94*  --  97* 97*  --   --   --   --  98  --   CO2 29  --  28 29  --   --   --   --   --   --   27  --   --   --   --  21*  --   GLUCOSE 110*  --  116* 106*   < > 198*  --  234*  --  172* 114*  --   --   --   --  118*  --   BUN 35*  --  32* 34*   < > 29*  --  32*  --  30* 29*  --   --   --   --  31*  --   CREATININE 1.71*  --  1.62* 1.73*   < > 1.60*  --  1.50*  --  1.60* 1.65*  --  1.75*  --   --  2.11*  --   CALCIUM 8.7*  --  8.6* 9.0  --   --   --   --   --   --  8.4*  --   --   --   --  8.0*  --   MG 2.1  --   --  2.1  --   --   --   --   --   --  1.5*  --  2.3  --   --  2.2  --   PHOS  --   --   --   --   --   --   --   --   --   --   --   --   --   --   --  3.3  --    < > = values in this interval not displayed.    Liver Function Tests: Recent Labs  Lab 09/18/21 0407  AST 122*  ALT 26  ALKPHOS 49  BILITOT 3.1*  PROT 4.7*  ALBUMIN 2.6*   No results for input(s): LIPASE, AMYLASE in the last 168 hours. No results for input(s): AMMONIA in the last 168 hours.  CBC: Recent Labs  Lab 09/16/21 0415 09/16/21 0515 09/17/21 0332 09/17/21 0610 09/17/21 1134 09/17/21 1202 09/17/21 1555 09/17/21 1605 09/17/21 2057 09/17/21 2106 09/17/21 2238 09/18/21 0407 09/18/21 0451  WBC 7.9  --  8.1  --   --   --  18.6*  --  17.0*  --   --  14.1*  --   NEUTROABS  --   --   --   --   --   --   --   --   --   --   --  9.7*  --   HGB 8.7*   < > 9.7*   < > 6.1*   7.5*   < > 9.4*   < > 7.6* 7.5* 5.8* 6.5* 6.8*  HCT 26.1*   < > 29.4*   < > 17.8*   22.0*   < > 27.3*   < > 22.9* 22.0* 17.0* 18.8* 20.0*  MCV 89.1  --  89.9  --   --   --  89.5  --  90.9  --   --  89.5  --   PLT 149*  --  173  --  125*  --  69*   69*  --  82*  --   --  116*  --    < > = values in this interval not displayed.    INR: Recent Labs  Lab 09/17/21 1555 09/18/21 0407  INR 1.7*   1.8* 1.6*    Other results: EKG:   Imaging: DG Chest Port 1 View  Result Date: 09/17/2021 CLINICAL DATA:  Implantable left ventricular assist device EXAM: PORTABLE CHEST 1 VIEW COMPARISON:  Radiograph 09/12/2021 FINDINGS:  Interval median sternotomy with LVAD implantation. Endotracheal tube overlies the upper thoracic trachea. Pulmonary artery catheter tip overlies the right pulmonary artery. Unchanged pacemaker/AICD leads. Bilateral chest tubes are in place. There is a mediastinal drain in place. Orogastric tube side port overlies the gastroesophageal junction. Impella device has been removed. Mildly enlarged cardiac silhouette. Mild diffuse interstitial opacities. No large pleural effusion. No visible pneumothorax. Right axillary surgical clips. IMPRESSION: Postoperative chest after LVAD placement. Lines and tubes as described above, of note the orogastric tube side port overlies the gastroesophageal junction, recommend advancement. Mild pulmonary edema. Electronically Signed   By: Maurine Simmering M.D.   On: 09/17/2021 16:33   ECHO INTRAOPERATIVE TEE  Result Date: 09/17/2021  *INTRAOPERATIVE TRANSESOPHAGEAL REPORT *  Patient Name:   Isaac [name] Date of Exam: 09/17/2021 Medical Rec #:  [id number]           Height:  69.0 in Accession #:    [id number]          Weight:       173.1 lb Date of Birth:  September 13, 1951            BSA:          1.94 m Patient Age:    89 years            BP:           113/97 mmHg Patient Gender: M                   HR:           107 bpm. Exam Location:  Anesthesiology Transesophogeal exam was perform intraoperatively during surgical procedure. Patient was closely monitored under general anesthesia during the entirety of examination. Indications:     CAD Native Vessel i25.10 Sonographer:     Raquel Sarna Senior RDCS Performing Phys: Suella Broad MD Diagnosing Phys: Suella Broad MD Complications: No known complications during this procedure.                            POST-OP IMPRESSIONS s/p Insertion of Implantable Left Ventricular Assist Device (Heartmate 3) _ Left Ventricle: Heartmate 3 inflow cannula present. Cannula oriented away from the septal wall. Appears to be stable and functioning well. _ Right  Ventricle: RV wall motion and function improved. AICD wires present. New PA catheter traversing the RV into the main pulmonary artery. _ Aorta: No dissection noted after cannula removed. Impella no longer present. _ Left Atrium: The left atrium appears unchanged from pre-bypass. _ Left Atrial Appendage: The left atrial appendage appears unchanged from pre-bypass. _ Aortic Valve: Impella no longer present. No significant AI present. Normal valve motion and function. _ Mitral Valve: MR slightly improved. Normal leaflet function. _ Tricuspid Valve: TR mildly improved, normal leaflet function. _ Pulmonic Valve: The pulmonic valve appears unchanged from pre-bypass. _ Interatrial Septum: The interatrial septum appears unchanged from pre-bypass. _ Interventricular Septum: The interventricular septum appears unchanged from pre-bypass. _ Pericardium: The pericardium appears unchanged from pre-bypass. PRE-OP FINDINGS  Left Ventricle: The left ventricle is less than 20%. The cavity size was severely dilated. Left ventricular diffusely hypokinetic. There is no left ventricular hypertrophy. Left ventricular diastolic function could not be evaluated. Well positioned Impella device noted in the LV ventricle. Right Ventricle: The right ventricle has severely reduced systolic function. The cavity was dilated. There is no increase in right ventricular wall thickness. There is no aneurysm seen. AICD wires and PA catheter present. Left Atrium: Left atrial size was dilated. No left atrial/left atrial appendage thrombus was detected. Left atrial appendage velocity is reduced at less than 40 cm/s. Right Atrium: Right atrial size was dilated. AICD wires and PA catheter present traversing the right atrium. Interatrial Septum: No atrial level shunt detected by color flow Doppler. Agitated saline contrast bubble study was negative, with no evidence of any interatrial shunt. There is no evidence of a patent foramen ovale. Pericardium: There is  no evidence of pericardial effusion. There is a moderate pleural effusion in the left lateral region. Mitral Valve: The mitral valve is normal in structure. Mitral valve regurgitation is moderate by color flow Doppler. The MR jet is centrally-directed. There is no evidence of mitral valve vegetation. There is no evidence of mitral stenosis. Tricuspid Valve: The tricuspid valve was normal in structure. Tricuspid valve regurgitation is severe by color flow Doppler.  The jet is directed eccentrically and could be related to ICD wires and PA catheter. No evidence of tricuspid stenosis is present. There is no evidence of tricuspid valve vegetation. Aortic Valve: The aortic valve is tricuspid. Aortic valve regurgitation is trivial by color flow Doppler and likely related to well positioned Impella device. The jet is centrally-directed. There is no stenosis of the aortic valve. There is no evidence of aortic valve vegetation. Pulmonic Valve: The pulmonic valve was normal in structure. Pulmonic valve regurgitation is trivial by color flow Doppler. Aorta: The aortic root and ascending aorta are normal in size and structure. There is evidence of protruding and layered plaque in the descending aorta; Grade II, measuring 2-31m in size. Pulmonary Artery: The pulmonary artery is of normal size. PA catheter present. Venous: The inferior vena cava was not well visualized. Shunts: There is no evidence of an atrial septal defect. +--------------+-------++  LEFT VENTRICLE           +--------------+-------++  PLAX 2D                  +--------------+-------++  LVIDd:         7.10 cm   +--------------+-------++  LVIDs:         6.40 cm   +--------------+-------++  LV SV:         55 ml     +--------------+-------++  LV SV Index:   28.08     +--------------+-------++                           +--------------+-------++  KSuella BroadMD Electronically signed by KSuella BroadMD Signature Date/Time: 09/17/2021/3:58:48 PM    Final     ECHOCARDIOGRAM LIMITED  Result Date: 09/16/2021    ECHOCARDIOGRAM LIMITED REPORT   Patient Name:   WKENNEY GOINGDate of Exam: 09/16/2021 Medical Rec #:  [id number]          Height:       69.0 in Accession #:    [id number]         Weight:       170.9 lb Date of Birth:  707-27-1952           BSA:          1.932 m Patient Age:    89years            BP:           93/75 mmHg Patient Gender: M                   HR:           95 bpm. Exam Location:  Inpatient Procedure: Limited Echo Indications:    impella placement check  History:        Patient has prior history of Echocardiogram examinations, most                 recent 08/23/2021.  Sonographer:    LJohny ChessRDCS Referring Phys: 3Valders 1. Limited echo for impella posiition. Device approximately 4.95 cm distal to the aortic annulus.  2. Left ventricular ejection fraction, by estimation, is 20 to 25%. The left ventricle has severely decreased function. The left ventricle demonstrates global hypokinesis. The left ventricular internal cavity size was moderately dilated.  3. Left atrial size was moderately dilated.  4. Right atrial size was moderately dilated. FINDINGS  Left Ventricle: Left  ventricular ejection fraction, by estimation, is 20 to 25%. The left ventricle has severely decreased function. The left ventricle demonstrates global hypokinesis. The left ventricular internal cavity size was moderately dilated. Left Atrium: Left atrial size was moderately dilated. Right Atrium: Right atrial size was moderately dilated. Pericardium: There is no evidence of pericardial effusion. Additional Comments: Limited echo for impella posiition. Device approximately 4.95 cm distal to the aortic annulus. Jenkins Rouge MD Electronically signed by Jenkins Rouge MD Signature Date/Time: 09/16/2021/12:04:02 PM    Final      Medications:     Scheduled Medications:  sodium chloride   Intravenous Once   sodium chloride   Intravenous Once    acetaminophen  1,000 mg Oral Q6H   Or   acetaminophen (TYLENOL) oral liquid 160 mg/5 mL  1,000 mg Per Tube Q6H   aspirin EC  325 mg Oral Daily   Or   aspirin  324 mg Per Tube Daily   Or   aspirin  300 mg Rectal Daily   bisacodyl  10 mg Oral Daily   Or   bisacodyl  10 mg Rectal Daily   chlorhexidine gluconate (MEDLINE KIT)  15 mL Mouth Rinse BID   Chlorhexidine Gluconate Cloth  6 each Topical Daily   docusate sodium  200 mg Oral Daily   feeding supplement  237 mL Oral TID BM   insulin aspart  0-15 Units Subcutaneous Q4H   insulin detemir  20 Units Subcutaneous Daily   mouth rinse  15 mL Mouth Rinse 10 times per day   montelukast  5 mg Oral QHS   multivitamin with minerals  1 tablet Oral Daily   [START ON 09/19/2021] pantoprazole  40 mg Oral Daily   rosuvastatin  10 mg Oral Daily   sodium chloride flush  10-40 mL Intracatheter Q12H   sodium chloride flush  3 mL Intravenous Q12H    Infusions:  sodium chloride 20 mL/hr at 09/18/21 0700   sodium chloride     sodium chloride 20 mL/hr at 09/17/21 1530   amiodarone 30 mg/hr (09/18/21 0700)    ceFAZolin (ANCEF) IV Stopped (09/18/21 0543)   dexmedetomidine (PRECEDEX) IV infusion 0.2 mcg/kg/hr (09/18/21 0700)   epinephrine 15 mcg/min (09/18/21 0700)   lactated ringers     lactated ringers     lactated ringers 20 mL/hr at 09/17/21 1530   milrinone 0.25 mcg/kg/min (09/18/21 0700)   norepinephrine (LEVOPHED) Adult infusion 23 mcg/min (09/18/21 0700)   rifampin (RIFADIN) IVPB     vancomycin     vasopressin 0.04 Units/min (09/18/21 0700)    PRN Medications: sodium chloride, albuterol, lactated ringers, midazolam, morphine injection, ondansetron (ZOFRAN) IV, oxyCODONE, sodium chloride flush, sodium chloride flush, traMADol   Assessment:   POD 1 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 Moderate RV systolic dysfunction by  echo preop but looked better after LVAD insertion in OR. 2.   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 Decided against removal of ICD/pacer and TV replacement. Improved a little at the end of surgery. 3.  Hx of atrial fib on amio. Looks sinus this am. 4.   Stage 3 CKD with baseline creat 2.1 this am. Preop was as low as 1.49 and then increased to 1.7 before surgery after large diuresis the day before. 5.   CAD with PCI of OM1 in 2007 and RCA in 2013. 6.  Acute postop blood loss anemia.     Plan/Discussion:    Plan to wean vent for extubation as tolerated today as he wakes up.  Continue inotopic/vasopressor support to maintain MAP>70. Will externally pace DDD 90 to help RV emptying and hopefully increase MAP.  Keep chest tubes and JP in today.  Transfusing 2 units PRBC's this am for Hgb 6.5.   Hold off on starting anticoagulation with postop coagulopathy.  I reviewed the LVAD parameters from today, and compared the results to the patient's prior recorded data.  No programming changes were made.  The LVAD is functioning within specified parameters.   Length of Stay: [address]  Gaye Pollack 09/18/2021, 7:38 AM

## 2021-09-18 NOTE — Progress Notes (Signed)
Patient ID: Makye Radle, male   DOB: [birth year], 70 y.o.   MRN: [id number]  TCTS Evening Rounds:   Hemodynamically stable on  milrinone 0.25, epi 14, NE 18, vaso 0.04 CI = 2.5, MAP 80's LVAD parameters stable on speed 5400  Extubated this evening. He is awake and alert. Some secretions that require suctioning. Keep NPO.  Urine output 30/hr.  CT output low. JP 10/hr.  CBC    Component Value Date/Time   WBC 12.3 (H) 09/18/2021 1505   RBC 2.60 (L) 09/18/2021 1505   HGB 7.8 (L) 09/18/2021 1804   HGB 13.0 07/06/2014 1425   HCT 23.0 (L) 09/18/2021 1804   HCT 40.4 07/06/2014 1425   PLT 113 (L) 09/18/2021 1505   PLT 228 07/06/2014 1425   MCV 83.8 09/18/2021 1505   MCV 91 07/06/2014 1425   MCH 29.2 09/18/2021 1505   MCHC 34.9 09/18/2021 1505   RDW 16.7 (H) 09/18/2021 1505   RDW 14.2 07/06/2014 1425   LYMPHSABS 1.6 09/18/2021 0407   MONOABS 1.5 (H) 09/18/2021 0407   EOSABS 1.2 (H) 09/18/2021 0407   BASOSABS 0.0 09/18/2021 0407     BMET    Component Value Date/Time   NA 134 (L) 09/18/2021 1804   NA 141 07/06/2014 1425   K 4.8 09/18/2021 1804   K 3.6 07/06/2014 1425   CL 99 09/18/2021 1505   CL 110 (H) 07/06/2014 1425   CO2 23 09/18/2021 1505   CO2 30 07/06/2014 1425   GLUCOSE 120 (H) 09/18/2021 1505   GLUCOSE 93 07/06/2014 1425   BUN 34 (H) 09/18/2021 1505   BUN 15 07/06/2014 1425   CREATININE 2.20 (H) 09/18/2021 1505   CREATININE 1.45 (H) 07/06/2014 1425   CALCIUM 7.9 (L) 09/18/2021 1505   CALCIUM 8.5 07/06/2014 1425   GFRNONAA 32 (L) 09/18/2021 1505   GFRNONAA 52 (L) 07/06/2014 1425     A/P:  Stable postop course. Continue current plans, pulmonary toilet, bipap as needed. Transfused 1 unit PRBC's this afternoon for Hgb 7.8

## 2021-09-18 NOTE — Progress Notes (Signed)
Nutrition Follow-up  DOCUMENTATION CODES:   Severe malnutrition in context of chronic illness  INTERVENTION:   If not extubated today recommend initiation of TF TF recommendations:  Pivot 1.5 at 20 ml/hr with goal of 65 ml/hr Begin at 20 ml/hr, titrate by 10 mL q 8 hours until goal rate of 65 ml/hr Provides 2340 kcals, 146 g of protein and 1186 mL of free water  Once extubated and diet advanced, resume Ensure Enlive po TID, each supplement provides 350 kcal and 20 grams of protein  No BM since 12/21; recommend aggressive bowel regimen   NUTRITION DIAGNOSIS:   Severe Malnutrition related to chronic illness (heart failure) as evidenced by severe fat depletion, severe muscle depletion.  Being addressed via supplements  GOAL:   Patient will meet greater than or equal to 90% of their needs  Not Met  MONITOR:   PO intake, Supplement acceptance, Labs, Weight trends  REASON FOR ASSESSMENT:   Consult LVAD Eval  ASSESSMENT:   70 yo male admitted with acute on chronic CHF. PMH includes CHF, CAD, CKD, HLD, HTN  12/23 Impella 5.5 placed 12/28 HM3 LVAD plaecd, Impella removed, Intubated  Pt remains on vent this AM, attempting to wean to extubate  Pt with good appetite prior to procedure on 12/28; eating mostly 100% of meals. Pt also drinking Ensure supplements  Weight up post op to 84 kg; lowest wt 74 kg. Per I/o flow sheet pt is net negative  Chest tube with 1L/24 hours Large hematoma at Impella site, JP drain placed after removal with 460 mL/24 hours UOP 1L/24 hours  Constipated with no documented BM since 12/21. Noted order for schedule dulcolax, colace  Labs: sodium 134 (L) Meds: ss novolog, levemir, MVI, KCl    Diet Order:   Diet Order             Diet NPO time specified  Diet effective now                   EDUCATION NEEDS:   Education needs have been addressed  Skin:  Skin Assessment: Reviewed RN Assessment  Last BM:  12/21  Height:   Ht  Readings from Last 1 Encounters:  09/17/21 5' 9"  (1.753 m)    Weight:   Wt Readings from Last 1 Encounters:  09/18/21 84 kg     BMI:  Body mass index is 27.35 kg/m.  Estimated Nutritional Needs:   Kcal:  2300-2500 kcals  Protein:  135-160 g  Fluid:  1.8 L   Kerman Passey MS, RDN, LDN, CNSC Registered Dietitian III Clinical Nutrition RD Pager and On-Call Pager Number Located in [place]

## 2021-09-18 NOTE — Progress Notes (Signed)
Hypoglycemic Event  CBG: 55  Treatment: D50 25 mL (12.5 gm)  Symptoms: None  Follow-up CBG: Time:2325 CBG Result:96  Possible Reasons for Event: Inadequate meal intake   Bard Herbert

## 2021-09-18 NOTE — Progress Notes (Addendum)
LVAD Coordinator Rounding Note:  Admitted 09/06/21 due to CHF exacerbation to Dr Claris Gladden service. He was started on Milrinone on admission. Febrile n 09/10/21- suspected to have UTI- antibiotics started. Impella 5.5 placed 09/12/21.   HM III LVAD implanted on 09/17/21 by Dr Cyndia Bent under destination therapy criteria. 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   Pt remains intubated and sedated this morning. He is beginning to wake up, but is not yet following commands.   Hgb 6.5 this morning. Had significant drainage from JP drain at previous Impella site overnight. Received 2 units PRBC this morning. Plan for repeat CBC later this morning.  12 lbs up from pre-op weight. Will defer diuresis for now per Dr Aundra Dubin. Creatinine 2.1 this morning.   VAD interrogation with 2 LOW FLOW alarms yesterday afternoon at 1608. Pt dropped BP while Levophed bag was exchanged, but recovered quickly. Speed decreased to 5300 at time of low flows. Speed remains at 5300 this morning until volume status is corrected. 60 + PI events noted overnight possibly related to hypovolemia with blood loss via JP.  Per pt's wife pt has not had a bowel movement in 7 days. He received bowel regimen via OG tube this morning.   Vital signs: Temp: 98.1 HR: DDD 90 Doppler Pressure: 90 Femoral Art Line BP: 96/63 (87) O2 Sat: 95% FiO2 50% Wt: 185.2 lbs    LVAD interrogation reveals:  Speed: 5300 Flow: 3.7 Power:  3.6 w PI: 2.5  Hematocrit: 20  Alarms: 2 LOW FLOW alarms as noted above Events:  60 PI events overnight  Fixed speed: 5300 Low speed limit: 5000  Drive Line: Existing VAD dressing removed and site care performed using sterile technique. Drive line exit site cleaned with Chlora prep applicators x 2, allowed to dry, and gauze dressing with silver strip applied. Exit site not incorporated, the velour is fully implanted at exit site. 2  sutures in place. Small amount of bloody drainage noted at site and on guaze. No redness, tenderness, foul odor or rash noted. Drive line anchor re-applied. Daily dressing changes per Nurse Davonna Belling or [place] coordinator. Next dressing change due: 09/19/21.   Labs:  LDH trend: 344  INR trend: 1.6>  WBC trend: 14.1>  Cr trend: 2.11>  Hgb trend: 6.5 >   Anticoagulation Plan: -INR Goal: 2.0 - 2.5 -ASA Dose: 325 mg daily (until INR therapeutic)   Blood Products:  Intra Op: 09/17/21  4 FFP   4 PRBC  Cellsaver- 803 cc Post Op:  - 09/18/21>> 2 PRBCs  Device: - [employer] dual -Therapies: OFF  *Will need to be referred to device clinic at discharge*  Arrythmias: 3 episodes of VT that required defib while in OR 09/17/21. Currently on Amiodarone gtt. Hx: afib.   Respiratory: remains intubated and on Nitric Oxide  Infection:  09/10/21>> blood cultures >> No growth 5 days; final 09/12/21>> urine culture >> No growth; final   Drips:  Amiodarone 30 mg/hr Epinephrine 13 mcg/min Milrinone 0.25 mcg/kg/min Levophed 19 mcg/min Vasopressin 0.04 Insulin  Patient Education: Pt remains intubated/sedated this morning Wife ([name]) at bedside. I performed and verbalized dressing change for her.  Discussed importance of sterile technique with dressing change. (Ex: sterile gloves, cap, and mask) Discussed importance of always wearing 1-2 anchors at all times.   Plan/Recommendations:  1. Page VAD coordinator for drive line or equipment concerns 2. Daily drive line dressing change per Nurse Davonna Belling  or VAD coordinator  Emerson Monte RN [place] Coordinator  Office: [phone]  24/7 Pager: [phone]

## 2021-09-18 NOTE — Progress Notes (Signed)
Outpatient Heart Failure CSW following pt post LVAD implantation yesterday- remains intubated today so unable to interact with pt at this time but will continue to follow during inpatient stay and assist as needed  Jorge Ny, [facility] Desk#: [phone] Cell#: [phone]

## 2021-09-19 ENCOUNTER — Inpatient Hospital Stay: Payer: Self-pay

## 2021-09-19 ENCOUNTER — Inpatient Hospital Stay (HOSPITAL_COMMUNITY): Payer: Medicare HMO

## 2021-09-19 ENCOUNTER — Ambulatory Visit: Payer: Medicare HMO | Admitting: Family

## 2021-09-19 DIAGNOSIS — I5043 Acute on chronic combined systolic (congestive) and diastolic (congestive) heart failure: Secondary | ICD-10-CM | POA: Diagnosis not present

## 2021-09-19 DIAGNOSIS — I509 Heart failure, unspecified: Secondary | ICD-10-CM | POA: Diagnosis not present

## 2021-09-19 DIAGNOSIS — Z95811 Presence of heart assist device: Secondary | ICD-10-CM | POA: Diagnosis not present

## 2021-09-19 LAB — GLUCOSE, CAPILLARY
Glucose-Capillary: 96 mg/dL (ref 70–99)
Glucose-Capillary: 125 mg/dL — ABNORMAL HIGH (ref 70–99)
Glucose-Capillary: 39 mg/dL — CL (ref 70–99)
Glucose-Capillary: 119 mg/dL — ABNORMAL HIGH (ref 70–99)
Glucose-Capillary: 63 mg/dL — ABNORMAL LOW (ref 70–99)
Glucose-Capillary: 63 mg/dL — ABNORMAL LOW (ref 70–99)
Glucose-Capillary: 47 mg/dL — ABNORMAL LOW (ref 70–99)
Glucose-Capillary: 108 mg/dL — ABNORMAL HIGH (ref 70–99)
Glucose-Capillary: 61 mg/dL — ABNORMAL LOW (ref 70–99)
Glucose-Capillary: 61 mg/dL — ABNORMAL LOW (ref 70–99)
Glucose-Capillary: 55 mg/dL — ABNORMAL LOW (ref 70–99)
Glucose-Capillary: 52 mg/dL — ABNORMAL LOW (ref 70–99)

## 2021-09-19 LAB — POCT I-STAT 7, (LYTES, BLD GAS, ICA,H+H)
Acid-Base Excess: 0 mmol/L (ref 0.0–2.0)
Acid-Base Excess: 2 mmol/L (ref 0.0–2.0)
Acid-Base Excess: 1 mmol/L (ref 0.0–2.0)
Acid-base deficit: 1 mmol/L (ref 0.0–2.0)
Bicarbonate: 22.1 mmol/L (ref 20.0–28.0)
Bicarbonate: 24.2 mmol/L (ref 20.0–28.0)
Bicarbonate: 24.3 mmol/L (ref 20.0–28.0)
Bicarbonate: 25.4 mmol/L (ref 20.0–28.0)
Calcium, Ion: 1.11 mmol/L — ABNORMAL LOW (ref 1.15–1.40)
Calcium, Ion: 1.12 mmol/L — ABNORMAL LOW (ref 1.15–1.40)
Calcium, Ion: 1.14 mmol/L — ABNORMAL LOW (ref 1.15–1.40)
Calcium, Ion: 1.15 mmol/L (ref 1.15–1.40)
HCT: 22 % — ABNORMAL LOW (ref 39.0–52.0)
HCT: 24 % — ABNORMAL LOW (ref 39.0–52.0)
HCT: 25 % — ABNORMAL LOW (ref 39.0–52.0)
HCT: 26 % — ABNORMAL LOW (ref 39.0–52.0)
Hemoglobin: 7.5 g/dL — ABNORMAL LOW (ref 13.0–17.0)
Hemoglobin: 8.2 g/dL — ABNORMAL LOW (ref 13.0–17.0)
Hemoglobin: 8.5 g/dL — ABNORMAL LOW (ref 13.0–17.0)
Hemoglobin: 8.8 g/dL — ABNORMAL LOW (ref 13.0–17.0)
O2 Saturation: 91 %
O2 Saturation: 97 %
O2 Saturation: 99 %
O2 Saturation: 99 %
Patient temperature: 35.7
Patient temperature: 36.1
Patient temperature: 36.2
Patient temperature: 98.8
Potassium: 3.8 mmol/L (ref 3.5–5.1)
Potassium: 3.9 mmol/L (ref 3.5–5.1)
Potassium: 4.2 mmol/L (ref 3.5–5.1)
Potassium: 4.3 mmol/L (ref 3.5–5.1)
Sodium: 133 mmol/L — ABNORMAL LOW (ref 135–145)
Sodium: 134 mmol/L — ABNORMAL LOW (ref 135–145)
Sodium: 134 mmol/L — ABNORMAL LOW (ref 135–145)
Sodium: 135 mmol/L (ref 135–145)
TCO2: 23 mmol/L (ref 22–32)
TCO2: 25 mmol/L (ref 22–32)
TCO2: 25 mmol/L (ref 22–32)
TCO2: 26 mmol/L (ref 22–32)
pCO2 arterial: 28.1 mmHg — ABNORMAL LOW (ref 32.0–48.0)
pCO2 arterial: 31.5 mmHg — ABNORMAL LOW (ref 32.0–48.0)
pCO2 arterial: 33.3 mmHg (ref 32.0–48.0)
pCO2 arterial: 34.7 mmHg (ref 32.0–48.0)
pH, Arterial: 7.447 (ref 7.350–7.450)
pH, Arterial: 7.491 — ABNORMAL HIGH (ref 7.350–7.450)
pH, Arterial: 7.492 — ABNORMAL HIGH (ref 7.350–7.450)
pH, Arterial: 7.5 — ABNORMAL HIGH (ref 7.350–7.450)
pO2, Arterial: 127 mmHg — ABNORMAL HIGH (ref 83.0–108.0)
pO2, Arterial: 127 mmHg — ABNORMAL HIGH (ref 83.0–108.0)
pO2, Arterial: 51 mmHg — ABNORMAL LOW (ref 83.0–108.0)
pO2, Arterial: 81 mmHg — ABNORMAL LOW (ref 83.0–108.0)

## 2021-09-19 LAB — CBC WITH DIFFERENTIAL/PLATELET
Abs Immature Granulocytes: 0.06 10*3/uL (ref 0.00–0.07)
Basophils Absolute: 0.1 10*3/uL (ref 0.0–0.1)
Basophils Relative: 0 %
Eosinophils Absolute: 0.6 10*3/uL — ABNORMAL HIGH (ref 0.0–0.5)
Eosinophils Relative: 5 %
HCT: 21.2 % — ABNORMAL LOW (ref 39.0–52.0)
Hemoglobin: 7.5 g/dL — ABNORMAL LOW (ref 13.0–17.0)
Immature Granulocytes: 0 %
Lymphocytes Relative: 5 %
Lymphs Abs: 0.7 10*3/uL (ref 0.7–4.0)
MCH: 30 pg (ref 26.0–34.0)
MCHC: 35.4 g/dL (ref 30.0–36.0)
MCV: 84.8 fL (ref 80.0–100.0)
Monocytes Absolute: 0.7 10*3/uL (ref 0.1–1.0)
Monocytes Relative: 5 %
Neutro Abs: 11.4 10*3/uL — ABNORMAL HIGH (ref 1.7–7.7)
Neutrophils Relative %: 85 %
Platelets: 116 10*3/uL — ABNORMAL LOW (ref 150–400)
RBC: 2.5 MIL/uL — ABNORMAL LOW (ref 4.22–5.81)
RDW: 16.9 % — ABNORMAL HIGH (ref 11.5–15.5)
WBC: 13.6 10*3/uL — ABNORMAL HIGH (ref 4.0–10.5)
nRBC: 0 % (ref 0.0–0.2)

## 2021-09-19 LAB — BASIC METABOLIC PANEL
Anion gap: 10 (ref 5–15)
BUN: 38 mg/dL — ABNORMAL HIGH (ref 8–23)
CO2: 24 mmol/L (ref 22–32)
Calcium: 8.1 mg/dL — ABNORMAL LOW (ref 8.9–10.3)
Chloride: 98 mmol/L (ref 98–111)
Creatinine, Ser: 2.51 mg/dL — ABNORMAL HIGH (ref 0.61–1.24)
GFR, Estimated: 27 mL/min — ABNORMAL LOW (ref >60)
Glucose, Bld: 57 mg/dL — ABNORMAL LOW (ref 70–99)
Potassium: 4.2 mmol/L (ref 3.5–5.1)
Sodium: 132 mmol/L — ABNORMAL LOW (ref 135–145)

## 2021-09-19 LAB — COMPREHENSIVE METABOLIC PANEL
ALT: 9 U/L (ref 0–44)
AST: 113 U/L — ABNORMAL HIGH (ref 15–41)
Albumin: 2.3 g/dL — ABNORMAL LOW (ref 3.5–5.0)
Alkaline Phosphatase: 67 U/L (ref 38–126)
Anion gap: 8 (ref 5–15)
BUN: 36 mg/dL — ABNORMAL HIGH (ref 8–23)
CO2: 23 mmol/L (ref 22–32)
Calcium: 7.9 mg/dL — ABNORMAL LOW (ref 8.9–10.3)
Chloride: 101 mmol/L (ref 98–111)
Creatinine, Ser: 2.39 mg/dL — ABNORMAL HIGH (ref 0.61–1.24)
GFR, Estimated: 29 mL/min — ABNORMAL LOW (ref >60)
Glucose, Bld: 127 mg/dL — ABNORMAL HIGH (ref 70–99)
Potassium: 4.3 mmol/L (ref 3.5–5.1)
Sodium: 132 mmol/L — ABNORMAL LOW (ref 135–145)
Total Bilirubin: 4.3 mg/dL — ABNORMAL HIGH (ref 0.3–1.2)
Total Protein: 4.5 g/dL — ABNORMAL LOW (ref 6.5–8.1)

## 2021-09-19 LAB — CBC
HCT: 23.6 % — ABNORMAL LOW (ref 39.0–52.0)
Hemoglobin: 8.7 g/dL — ABNORMAL LOW (ref 13.0–17.0)
MCH: 30.3 pg (ref 26.0–34.0)
MCHC: 36.9 g/dL — ABNORMAL HIGH (ref 30.0–36.0)
MCV: 82.2 fL (ref 80.0–100.0)
Platelets: 113 10*3/uL — ABNORMAL LOW (ref 150–400)
RBC: 2.87 MIL/uL — ABNORMAL LOW (ref 4.22–5.81)
RDW: 15.8 % — ABNORMAL HIGH (ref 11.5–15.5)
WBC: 16.3 10*3/uL — ABNORMAL HIGH (ref 4.0–10.5)
nRBC: 0 % (ref 0.0–0.2)

## 2021-09-19 LAB — CALCIUM, IONIZED: Calcium, Ionized, Serum: 4.9 mg/dL (ref 4.5–5.6)

## 2021-09-19 LAB — PROTIME-INR
INR: 1.8 — ABNORMAL HIGH (ref 0.8–1.2)
Prothrombin Time: 20.6 seconds — ABNORMAL HIGH (ref 11.4–15.2)

## 2021-09-19 LAB — COOXEMETRY PANEL
Carboxyhemoglobin: 1.3 % (ref 0.5–1.5)
Methemoglobin: 0.8 % (ref 0.0–1.5)
O2 Saturation: 68.6 %
Total hemoglobin: 7.2 g/dL — ABNORMAL LOW (ref 12.0–16.0)

## 2021-09-19 LAB — MAGNESIUM: Magnesium: 2.2 mg/dL (ref 1.7–2.4)

## 2021-09-19 LAB — PREPARE RBC (CROSSMATCH)

## 2021-09-19 LAB — LACTATE DEHYDROGENASE: LDH: 379 U/L — ABNORMAL HIGH (ref 98–192)

## 2021-09-19 LAB — PHOSPHORUS: Phosphorus: 4.6 mg/dL (ref 2.5–4.6)

## 2021-09-19 MED ORDER — DEXTROSE 50 % IV SOLN
INTRAVENOUS | Status: AC
  Filled 2021-09-19: qty 50

## 2021-09-19 MED ORDER — MONTELUKAST SODIUM 10 MG PO TABS
5.0000 mg | ORAL_TABLET | Freq: Every day | ORAL | Status: DC
  Administered 2021-09-19 – 2021-10-07 (×17): 5 mg via ORAL
  Filled 2021-09-19 (×19): qty 1

## 2021-09-19 MED ORDER — SODIUM CHLORIDE 0.9% IV SOLUTION: Freq: Once | INTRAVENOUS | Status: DC

## 2021-09-19 MED ORDER — DEXTROSE 50 % IV SOLN
INTRAVENOUS | Status: AC
  Administered 2021-09-19: 22:00:00 12.5 g via INTRAVENOUS
  Filled 2021-09-19: qty 50

## 2021-09-19 MED ORDER — DEXTROSE 50 % IV SOLN: 12.5000 g | INTRAVENOUS | Status: AC

## 2021-09-19 MED ORDER — ENSURE ENLIVE PO LIQD
237.0000 mL | Freq: Three times a day (TID) | ORAL | Status: DC
Start: 2021-09-19 — End: 2021-10-24
  Administered 2021-09-19 – 2021-09-23 (×9): 237 mL via ORAL
  Administered 2021-09-24: 178 mL via ORAL
  Administered 2021-09-24 – 2021-10-23 (×31): 237 mL via ORAL
  Filled 2021-09-19 (×3): qty 237

## 2021-09-19 MED ORDER — DEXTROSE 50 % IV SOLN
INTRAVENOUS | Status: AC
  Administered 2021-09-19: 04:00:00 50 mL
  Filled 2021-09-19: qty 50

## 2021-09-19 MED ORDER — ROSUVASTATIN CALCIUM 5 MG PO TABS
10.0000 mg | ORAL_TABLET | Freq: Every day | ORAL | Status: DC
  Administered 2021-09-19 – 2021-10-07 (×17): 10 mg via ORAL
  Filled 2021-09-19 (×19): qty 2

## 2021-09-19 MED ORDER — FUROSEMIDE 10 MG/ML IJ SOLN
40.0000 mg | Freq: Once | INTRAMUSCULAR | Status: AC
  Administered 2021-09-19: 17:00:00 40 mg via INTRAVENOUS
  Filled 2021-09-19: qty 4

## 2021-09-19 MED ORDER — PANTOPRAZOLE SODIUM 40 MG PO TBEC
40.0000 mg | DELAYED_RELEASE_TABLET | Freq: Every day | ORAL | Status: DC
Start: 2021-09-19 — End: 2021-10-07
  Administered 2021-09-19 – 2021-10-07 (×17): 40 mg via ORAL
  Filled 2021-09-19 (×19): qty 1

## 2021-09-19 MED ORDER — DEXTROSE 50 % IV SOLN
12.5000 g | Freq: Once | INTRAVENOUS | Status: AC
  Administered 2021-09-19: 06:00:00 12.5 g via INTRAVENOUS

## 2021-09-19 MED ORDER — TRAMADOL HCL 50 MG PO TABS: 50.0000 mg | ORAL_TABLET | ORAL | Status: DC | PRN

## 2021-09-19 MED ORDER — POTASSIUM CHLORIDE 20 MEQ PO PACK: 20.0000 meq | PACK | Freq: Once | ORAL | Status: DC

## 2021-09-19 MED ORDER — FUROSEMIDE 10 MG/ML IJ SOLN
40.0000 mg | Freq: Once | INTRAMUSCULAR | Status: AC
  Administered 2021-09-19: 09:00:00 40 mg via INTRAVENOUS
  Filled 2021-09-19: qty 4

## 2021-09-19 MED ORDER — DOCUSATE SODIUM 100 MG PO CAPS
200.0000 mg | ORAL_CAPSULE | Freq: Every day | ORAL | Status: DC
  Administered 2021-09-19 – 2021-10-02 (×11): 200 mg via ORAL
  Filled 2021-09-19 (×14): qty 2

## 2021-09-19 MED ORDER — OXYCODONE HCL 5 MG PO TABS
5.0000 mg | ORAL_TABLET | ORAL | Status: DC | PRN
  Administered 2021-09-25 – 2021-09-27 (×3): 5 mg via ORAL
  Filled 2021-09-19 (×2): qty 1
  Filled 2021-09-19: qty 2
  Filled 2021-09-19: qty 1

## 2021-09-19 MED ORDER — FUROSEMIDE 10 MG/ML IJ SOLN
80.0000 mg | Freq: Once | INTRAMUSCULAR | Status: AC
  Administered 2021-09-20: 80 mg via INTRAVENOUS
  Filled 2021-09-19: qty 8

## 2021-09-19 MED ORDER — MELATONIN 5 MG PO TABS
5.0000 mg | ORAL_TABLET | Freq: Every evening | ORAL | Status: AC | PRN
  Administered 2021-09-19 – 2021-10-02 (×2): 5 mg via ORAL
  Filled 2021-09-19 (×2): qty 1

## 2021-09-19 MED ORDER — ASPIRIN 81 MG PO CHEW
324.0000 mg | CHEWABLE_TABLET | Freq: Every day | ORAL | Status: DC
  Administered 2021-09-19 – 2021-09-22 (×2): 324 mg via ORAL
  Filled 2021-09-19 (×3): qty 4

## 2021-09-19 MED ORDER — DEXTROSE 10 % IV SOLN: INTRAVENOUS | Status: DC

## 2021-09-19 MED ORDER — ADULT MULTIVITAMIN W/MINERALS CH
1.0000 | ORAL_TABLET | Freq: Every day | ORAL | Status: DC
  Administered 2021-09-19 – 2021-09-25 (×5): 1 via ORAL
  Filled 2021-09-19 (×7): qty 1

## 2021-09-19 MED ORDER — POTASSIUM CHLORIDE CRYS ER 20 MEQ PO TBCR: 20.0000 meq | EXTENDED_RELEASE_TABLET | Freq: Once | ORAL | Status: DC

## 2021-09-19 MED ORDER — DEXTROSE 50 % IV SOLN
25.0000 g | INTRAVENOUS | Status: AC
  Administered 2021-09-19: 12:00:00 25 g via INTRAVENOUS

## 2021-09-19 MED ORDER — SODIUM CHLORIDE 0.9% FLUSH
10.0000 mL | Freq: Two times a day (BID) | INTRAVENOUS | Status: DC
  Administered 2021-09-19 – 2021-09-22 (×6): 10 mL
  Administered 2021-09-23: 20 mL
  Administered 2021-09-23 – 2021-09-24 (×2): 10 mL
  Administered 2021-09-24: 20 mL
  Administered 2021-09-25 – 2021-10-01 (×8): 10 mL
  Administered 2021-10-01: 22:00:00 20 mL
  Administered 2021-10-03 – 2021-10-14 (×17): 10 mL
  Administered 2021-10-15: 22:00:00 30 mL
  Administered 2021-10-16 (×2): 20 mL
  Administered 2021-10-17 – 2021-10-23 (×12): 10 mL

## 2021-09-19 MED ORDER — DEXTROSE 50 % IV SOLN
INTRAVENOUS | Status: AC
  Administered 2021-09-19: 12.5 g via INTRAVENOUS
  Filled 2021-09-19: qty 50

## 2021-09-19 MED ORDER — POTASSIUM CHLORIDE 20 MEQ PO PACK
20.0000 meq | PACK | Freq: Once | ORAL | Status: DC
  Filled 2021-09-19: qty 1

## 2021-09-19 MED ORDER — SODIUM CHLORIDE 0.9% FLUSH: 10.0000 mL | INTRAVENOUS | Status: DC | PRN

## 2021-09-19 MED ORDER — DEXTROSE 50 % IV SOLN
25.0000 g | INTRAVENOUS | Status: AC
  Administered 2021-09-20: 25 g via INTRAVENOUS
  Filled 2021-09-19: qty 50

## 2021-09-19 NOTE — Progress Notes (Signed)
OT Cancellation Note  Patient Details Name: Isaac [name] MRN: [id number] DOB: [birth year]   Cancelled Treatment:    Reason Eval/Treat Not Completed: Medical issues which prohibited therapy.pt remains with swan and fem art line    Nilsa Nutting., OTR/L Acute Rehabilitation Services Pager [phone] Office [phone]  09/19/2021, 12:16 PM

## 2021-09-19 NOTE — Progress Notes (Signed)
LVAD Coordinator Rounding Note:  Admitted 09/06/21 due to CHF exacerbation to Dr Claris Gladden service. He was started on Milrinone on admission. Febrile n 09/10/21- suspected to have UTI- antibiotics started. Impella 5.5 placed 09/12/21.   HM III LVAD implanted on 09/17/21 by Dr Cyndia Bent under destination therapy criteria. 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   Pt laying in bed this morning. Bedside RN reports pt is delirious. Pt able to verbalize he is at [facility] and that he had heart surgery. But at times is saying nonsensical statements. He is covering his face with a blanket, and is very fidgety. Pt reorients easily, and temperament is pleasant. Wife is at bedside.   CBG at 0815 was 63. Bedside RN gave ginger ale. Repeat CBG at 1130 was 47. 1 amp D50 administered. Repeat CBG at 1237 was 108.   Hgb 7.5 this morning. Drainage from JP drain at previous Impella site slowing down. Received 1 unit PRBC this morning. Per Dr Cyndia Bent will hold off on starting anticoagulation today due to bleeding risk.   Weight up another 11 lbs this morning. Creatinine 2.39. Plan for dose of IV Lasix this morning.   Speed increased yesterday afternoon to 5400. Low speed limit 5100. Pt tolerating well.   Per pt's wife pt has not had a bowel movement in 8 days. Receiving scheduled bowel regimen.   Made plan with staff for nurse champion daily dressing change coverage Saturday, Sunday, and Monday.   Vital signs: Temp: 98.4 HR: AV paced 90 Doppler Pressure: 88 Femoral Art Line: 87/55 (74) Radial Art Line: 82/54 (72) O2 Sat: 99% on 4L [place] Wt: 185.2>196.8 lbs    LVAD interrogation reveals:  Speed: 5400 Flow: 4.7 Power:  3.9 w PI: 2.8  Hematocrit: 21  Alarms: none Events: 1 PI event so far today  Fixed speed: 5400 Low speed limit: 5100  Drive Line: Existing VAD dressing removed and site care performed using sterile  technique. Drive line exit site cleaned with Chlora prep applicators x 2, allowed to dry, and gauze dressing with silver strip applied. Exit site not incorporated, the velour is fully implanted at exit site. 2 sutures in place. Small amount of bloody drainage noted at site and on guaze. No redness, tenderness, foul odor or rash noted. Drive line anchor re-applied. Daily dressing changes per Nurse Davonna Belling or [place] coordinator. Next dressing change due: 09/20/21.   Labs:  LDH trend: 344>379  INR trend: 1.6>1.8  WBC trend: 14.1>13.6  Cr trend: 2.11>2.39  Hgb trend: 6.5>7.5  Anticoagulation Plan: -INR Goal: 2.0 - 2.5 -ASA Dose: 325 mg daily (until INR therapeutic)   Blood Products:  Intra Op: 09/17/21  4 FFP   4 PRBC  Cellsaver- 803 cc Post Op:  - 09/18/21>> 2 PRBCs - 09/19/21>> 1 PRBC  Device: - [employer] dual -Therapies: OFF  *Will need to be referred to device clinic at discharge*  Arrythmias: 3 episodes of VT that required defib while in OR 09/17/21. Currently on Amiodarone gtt. Hx: afib.   Respiratory: extubated 09/18/21  Infection:  09/10/21>> blood cultures >> No growth 5 days; final 09/12/21>> urine culture >> No growth; final   Drips:  Amiodarone 30 mg/hr Epinephrine 6 mcg/min Milrinone 0.25 mcg/kg/min Levophed 4 mcg/min Vasopressin 0.04-- off   Patient Education: Pt is confused this morning. Education inappropriate at this time.  Provided pt's wife with HM III Patient Manual today as requested. She plans  to begin reviewing over the weekend.  Will plan to start dressing change education with pt's wife next week. She observed dressing change yesterday, but with pt's confusion today, will continue education next week.  Plan/Recommendations:  1. [place] VAD coordinator for drive line or equipment concerns 2. Daily drive line dressing change per Nurse Davonna Belling or [place] coordinator  Emerson Monte RN [place] Coordinator  Office: [phone]  24/7 Pager:  [phone]

## 2021-09-19 NOTE — Progress Notes (Signed)
Hypoglycemic Event  CBG: 63  Treatment: D50 25 mL (12.5 gm)  Symptoms: Sweaty and Shaky  Follow-up CBG: Time:0610 CBG Result:124  Possible Reasons for Event: Inadequate meal intake  Bard Herbert

## 2021-09-19 NOTE — Progress Notes (Signed)
Hypoglycemic Event  CBG: 43  Treatment: D50 50 mL (25 gm)  Symptoms: Sweaty and Shaky  Follow-up CBG: Time:0350 CBG Result:125  Possible Reasons for Event: Inadequate meal intake  Bard Herbert

## 2021-09-19 NOTE — Progress Notes (Signed)
Patient ID: Isaac [name], male   DOB: 1951-02-21, 70 y.o.   MRN: [id number]   Advanced Heart Failure VAD Team Note  PCP-Cardiologist: None   Subjective:    12/19 RHC- RA 7, PA 39/14 (25), PCWP 11, CO 6.4 CI 3. Thermo 3.5 1.8. Lasix drip stopped after cath. 12/20 Swan removed.  Norepi 3 mcg added.  12/21 Fever. Blood CX drawn. UA + leukocytes. Started on vanc and cefepime. Diuresed with IV lasix + metolazone.  12/23 Impella 5.5 placed 12/28 HM III LVAD, ICD leads plastered to tricuspid valve with severe TR, valve not replaced and some improvement in TR with LVAD placement.  12/29 Extubated  On milrinone 0.25, NE 4, vasopressin 0.02, epinephrine 6.   Creatinine higher at 2.39.  UOP 985 cc, no Lasix. Weight up.   SWAN #s CVP 14 PA 25/12 CI 2.7 Co-ox 69%  There was large hematoma at Impella 5.5 site, JP drain placed after removal, now with less output.  Hgb 7.5 this morning after 2 units PRBCs yesterday.    LVAD INTERROGATION:  HeartMate III LVAD:   Flow 4.3 liters/min, speed 5400, power 3.9, PI 3.9.   LDH 344 => 379  Objective:    Vital Signs:   Temp:  [96.1 F (35.6 C)-99.7 F (37.6 C)] 96.1 F (35.6 C) (12/30 0700) Pulse Rate:  [30-230] 132 (12/30 0700) Resp:  [0-42] 25 (12/30 0700) BP: (80-111)/(51-89) 80/51 (12/30 0600) SpO2:  [82 %-100 %] 99 % (12/30 0658) Arterial Line BP: (74-107)/(51-86) 90/59 (12/30 0700) FiO2 (%):  [40 %-50 %] 40 % (12/29 1845) Weight:  [89.3 kg] 89.3 kg (12/30 0500) Last BM Date: 09/10/21 Mean arterial Pressure  70s-80s  Intake/Output:   Intake/Output Summary (Last 24 hours) at 09/19/2021 0744 Last data filed at 09/19/2021 0700 Gross per 24 hour  Intake 4017.18 ml  Output 2210 ml  Net 1807.18 ml     Physical Exam    General: Well appearing this am. NAD.  HEENT: Normal. Neck: Supple, JVP 10-12 cm. Carotids OK. Swan left neck.  Cardiac:  Mechanical heart sounds with LVAD hum present.  Lungs:  CTAB, normal effort.  Abdomen:   NT, ND, no HSM. No bruits or masses. +BS  LVAD exit site: Well-healed and incorporated. Dressing dry and intact. No erythema or drainage. Stabilization device present and accurately applied. Driveline dressing changed daily per sterile technique. Extremities:  Warm and dry. No cyanosis, clubbing, rash, or edema.  Neuro:  Alert & oriented x 3. Cranial nerves grossly intact. Moves all 4 extremities w/o difficulty. Affect pleasant     Telemetry   A-V paced(personally reviewed)   Labs   Basic Metabolic Panel: Recent Labs  Lab 09/17/21 0332 09/17/21 0610 09/17/21 1317 09/17/21 1555 09/17/21 1605 09/17/21 2057 09/17/21 2106 09/18/21 0407 09/18/21 0451 09/18/21 1505 09/18/21 1804 09/18/21 1855 09/19/21 0355 09/19/21 0358  NA 136   < > 134*   134* 132*   < >  --    < > 130*   < > 133* 134* 133* 134* 132*  K 4.3   < > 3.8   3.8 3.4*   < > 4.0   < > 4.2   < > 4.8 4.8 4.8 4.3 4.3  CL 97*   < > 97* 97*  --   --   --  98  --  99  --   --   --  101  CO2 29  --   --  27  --   --   --  21*  --  23  --   --   --  23  GLUCOSE 106*   < > 172* 114*  --   --   --  118*  --  120*  --   --   --  127*  BUN 34*   < > 30* 29*  --   --   --  31*  --  34*  --   --   --  36*  CREATININE 1.73*   < > 1.60* 1.65*  --  1.75*  --  2.11*  --  2.20*  --   --   --  2.39*  CALCIUM 9.0  --   --  8.4*  --   --   --  8.0*  --  7.9*  --   --   --  7.9*  MG 2.1  --   --  1.5*  --  2.3  --  2.2  --  2.3  --   --   --  2.2  PHOS  --   --   --   --   --   --   --  3.3  --   --   --   --   --  4.6   < > = values in this interval not displayed.    Liver Function Tests: Recent Labs  Lab 09/18/21 0407 09/19/21 0358  AST 122* 113*  ALT 26 9  ALKPHOS 49 67  BILITOT 3.1* 4.3*  PROT 4.7* 4.5*  ALBUMIN 2.6* 2.3*   No results for input(s): LIPASE, AMYLASE in the last 168 hours. No results for input(s): AMMONIA in the last 168 hours.  CBC: Recent Labs  Lab 09/17/21 2057 09/17/21 2106 09/18/21 0407  09/18/21 0451 09/18/21 1505 09/18/21 1804 09/18/21 1855 09/18/21 2222 09/19/21 0355 09/19/21 0358  WBC 17.0*  --  14.1*  --  12.3*  --   --  13.8*  --  13.6*  NEUTROABS  --   --  9.7*  --   --   --   --   --   --  11.4*  HGB 7.6*   < > 6.5*   < > 7.6* 7.8* 8.5* 8.3* 7.5* 7.5*  HCT 22.9*   < > 18.8*   < > 21.8* 23.0* 25.0* 22.3* 22.0* 21.2*  MCV 90.9  --  89.5  --  83.8  --   --  83.5  --  84.8  PLT 82*  --  116*  --  113*  --   --  118*  --  116*   < > = values in this interval not displayed.    INR: Recent Labs  Lab 09/17/21 1555 09/18/21 0407 09/19/21 0358  INR 1.7*   1.8* 1.6* 1.8*    Other results: EKG:    Imaging   DG Chest Port 1 View  Result Date: 09/18/2021 CLINICAL DATA:  Intubated, chest tube present, status post LVAD EXAM: PORTABLE CHEST 1 VIEW COMPARISON:  Chest radiograph 1 day prior FINDINGS: Endotracheal tube tip is approximately 4.9 cm from the carina. The left chest wall cardiac device and associated leads are stable. There is a left IJ Swan-Ganz catheter terminating in the right pulmonary artery. The left ventricular cyst device is stable. Bilateral chest tubes are stable. Presumed mediastinal drain is noted. The enteric catheter courses off the field of view. The side hole is no longer identified. The heart is enlarged, unchanged. The mediastinal contours are  stable. Aeration of the lungs is not significantly changed. There is no new or worsening focal airspace disease. There is no significant pleural effusion. There is no pneumothorax. The bones are stable. IMPRESSION: 1. Lines and tubes as above. 2. No significant change in lung aeration. No new or worsening airspace disease. Electronically Signed   By: Valetta Mole M.D.   On: 09/18/2021 08:40   DG Chest Port 1 View  Result Date: 09/17/2021 CLINICAL DATA:  Implantable left ventricular assist device EXAM: PORTABLE CHEST 1 VIEW COMPARISON:  Radiograph 09/12/2021 FINDINGS: Interval median sternotomy with LVAD  implantation. Endotracheal tube overlies the upper thoracic trachea. Pulmonary artery catheter tip overlies the right pulmonary artery. Unchanged pacemaker/AICD leads. Bilateral chest tubes are in place. There is a mediastinal drain in place. Orogastric tube side port overlies the gastroesophageal junction. Impella device has been removed. Mildly enlarged cardiac silhouette. Mild diffuse interstitial opacities. No large pleural effusion. No visible pneumothorax. Right axillary surgical clips. IMPRESSION: Postoperative chest after LVAD placement. Lines and tubes as described above, of note the orogastric tube side port overlies the gastroesophageal junction, recommend advancement. Mild pulmonary edema. Electronically Signed   By: Maurine Simmering M.D.   On: 09/17/2021 16:33   [place] EKG SITE RITE  Result Date: 09/19/2021 If Site Rite image not attached, placement could not be confirmed due to current cardiac rhythm.    Medications:     Scheduled Medications:  sodium chloride   Intravenous Once   sodium chloride   Intravenous Once   acetaminophen  1,000 mg Oral Q6H   Or   acetaminophen (TYLENOL) oral liquid 160 mg/5 mL  1,000 mg Per Tube Q6H   aspirin  324 mg Per Tube Daily   bisacodyl  10 mg Oral Daily   Or   bisacodyl  10 mg Rectal Daily   chlorhexidine gluconate (MEDLINE KIT)  15 mL Mouth Rinse BID   Chlorhexidine Gluconate Cloth  6 each Topical Daily   docusate  200 mg Per Tube Daily   feeding supplement  237 mL Per Tube TID BM   furosemide  40 mg Intravenous Once   insulin aspart  0-15 Units Subcutaneous Q4H   insulin detemir  20 Units Subcutaneous Daily   mouth rinse  15 mL Mouth Rinse BID   montelukast  5 mg Per Tube QHS   multivitamin  15 mL Per Tube Daily   pantoprazole sodium  40 mg Per Tube Daily   rosuvastatin  10 mg Per Tube Daily   sodium chloride flush  10-40 mL Intracatheter Q12H   sodium chloride flush  3 mL Intravenous Q12H    Infusions:  sodium chloride 20 mL/hr at  09/19/21 0700   sodium chloride Stopped (09/18/21 0803)   sodium chloride 20 mL/hr at 09/17/21 1530   amiodarone 30 mg/hr (09/19/21 0700)   dexmedetomidine (PRECEDEX) IV infusion Stopped (09/18/21 0720)   epinephrine 6 mcg/min (09/19/21 0700)   lactated ringers     lactated ringers     lactated ringers 20 mL/hr at 09/18/21 1756   milrinone 0.25 mcg/kg/min (09/19/21 0700)   norepinephrine (LEVOPHED) Adult infusion 4 mcg/min (09/19/21 0700)   vancomycin Stopped (09/18/21 1011)   vasopressin 0.02 Units/min (09/19/21 0700)    PRN Medications: sodium chloride, albuterol, lactated ringers, midazolam, morphine injection, ondansetron (ZOFRAN) IV, oxyCODONE, sodium chloride flush, sodium chloride flush, traMADol   Assessment/Plan:    1. Acute on chronic systolic CHF:  Long-standing cardiomyopathy.  [place].  Echo this admission with EF <  20%, severe LV dilation, restrictive diastolic function, moderate RV dysfunction, moderate MR, mod-severe TR. Cause of cardiomyopathy is uncertain.  He has a history of CAD, but I do not think that the described CAD from the past could explain his cardiomyopathy, but CAD could have progressed.  With difficulty tolerating GDMT/need for midodrine and cardiorenal syndrome as well as profound volume overload + NYHA class IV symptoms,  concerned for low output HF. Co-ox off milrinone was 36%, milrinone started and increased to 0.375 but CO remained low. NE added and Impella 5.5 placed 12/23. EF 10% on TEE 12/23. s/p HM III VAD on 12/28.  Currently on NE 4, epinephrine 6, milrinone 0.25, vasopressin 0.02.  CI 2.7 with co-ox 69%, CVP 14.  LVAD parameters stable, speed increased to 5400 yesterday. Creatinine mildly higher at 2.39.  - Wean vasopressin first, then NE.   - No anticoagulation yet with some oozing still from axillary wound.   - Lasix 40 mg IV x 1, reassess in pm.  2. Tricuspid regurgitation: Has moderate-severe TR.  Tricuspid repair not done at time  of VAD d/t proximity of ICD wires 3. Atrial fibrillation: Persistent since 10/22 based on device interrogation.  Currently A-V paced.  - Continue amiodarone gtt.  4. AKI on CKD stage 3: Creatinine 2.39, mildly higher this morning.    - Continue hemodynamic support.  5. CAD: History of PCI to OM1 in 2007 and RCA in 2013.  No chest pain or ACS.  - Deferred cardiac cath due to AKI and plan for VAD - Continue Crestor.  6. ID: Had fever 12/21, PCT 1.03.  Has been afebrile since with normal WBCs.  Blood cultures negative. ?Phlebitis at IV site. Completed course of abx.  7. L Pleural Effusion: Noted on CT.  8. Hyponatremia: Hypervolemic hyponatremia.  Na 132 today.    9. Anemia: Blood loss anemia, had hematoma at Impella 5.5 site with significant drainage after removal. Hgb 7.5 today.  - To get 1 unit PRBCs today.  - Follow CBC.   I reviewed the LVAD parameters from today, and compared the results to the patient's prior recorded data.  No programming changes were made.  The LVAD is functioning within specified parameters.  The patient performs LVAD self-test daily.  LVAD interrogation was negative for any significant power changes, alarms or PI events/speed drops.  LVAD equipment check completed and is in good working order.  Back-up equipment present.   LVAD education done on emergency procedures and precautions and reviewed exit site care.  CRITICAL CARE Performed by: Loralie Champagne  Total critical care time: 40 minutes  Critical care time was exclusive of separately billable procedures and treating other patients.  Critical care was necessary to treat or prevent imminent or life-threatening deterioration.  Critical care was time spent personally by me on the following activities: development of treatment plan with patient and/or surrogate as well as nursing, discussions with consultants, evaluation of patient's response to treatment, examination of patient, obtaining history from patient or  surrogate, ordering and performing treatments and interventions, ordering and review of laboratory studies, ordering and review of radiographic studies, pulse oximetry and re-evaluation of patient's condition.  Loralie Champagne 09/19/2021 7:44 AM

## 2021-09-19 NOTE — Progress Notes (Signed)
Patient ID: Isaac [name], male   DOB: 1950-09-26, 70 y.o.   MRN: [id number] HeartMate 3 Rounding Note  Subjective:    Hemodynamics stable overnight after extubation last pm.  CI 2.4, Co-ox 69 on milrinone 0.25, epi 6, NE 4. Vasopressin is off.  AV paced 90 on IV amio 30.   LVAD INTERROGATION:  HeartMate IIl LVAD:  Flow 4.5 liters/min, speed 5400, power 4, PI 3.7.   Objective:    Vital Signs:   Temp:  [96.1 F (35.6 C)-99.7 F (37.6 C)] 96.1 F (35.6 C) (12/30 0700) Pulse Rate:  [30-230] 132 (12/30 0700) Resp:  [0-42] 25 (12/30 0700) BP: (80-111)/(51-89) 80/51 (12/30 0600) SpO2:  [82 %-100 %] 99 % (12/30 0658) Arterial Line BP: (74-107)/(51-86) 90/59 (12/30 0700) FiO2 (%):  [40 %-50 %] 40 % (12/29 1845) Weight:  [89.3 kg] 89.3 kg (12/30 0500) Last BM Date: 09/10/21 Mean arterial Pressure 80's  Intake/Output:   Intake/Output Summary (Last 24 hours) at 09/19/2021 1044 Last data filed at 09/19/2021 0700 Gross per 24 hour  Intake 2696.15 ml  Output 1930 ml  Net 766.15 ml     Physical Exam: General:  Well appearing. No resp difficulty HEENT: normal Cor: Distant heart sounds with LVAD hum present. Lungs: clear Abdomen: soft, nontender, nondistended.Good bowel sounds. Extremities: moderate edema Neuro: alert & orientedx3, moves all 4 extremities w/o difficulty. Affect unusual this am   Telemetry: AV paced 90.  Labs: Basic Metabolic Panel: Recent Labs  Lab 09/17/21 0332 09/17/21 0610 09/17/21 1317 09/17/21 1555 09/17/21 1605 09/17/21 2057 09/17/21 2106 09/18/21 0407 09/18/21 0451 09/18/21 1505 09/18/21 1804 09/18/21 1855 09/19/21 0355 09/19/21 0358  NA 136   < > 134*   134* 132*   < >  --    < > 130*   < > 133* 134* 133* 134* 132*  K 4.3   < > 3.8   3.8 3.4*   < > 4.0   < > 4.2   < > 4.8 4.8 4.8 4.3 4.3  CL 97*   < > 97* 97*  --   --   --  98  --  99  --   --   --  101  CO2 29  --   --  27  --   --   --  21*  --  23  --   --   --  23  GLUCOSE 106*    < > 172* 114*  --   --   --  118*  --  120*  --   --   --  127*  BUN 34*   < > 30* 29*  --   --   --  31*  --  34*  --   --   --  36*  CREATININE 1.73*   < > 1.60* 1.65*  --  1.75*  --  2.11*  --  2.20*  --   --   --  2.39*  CALCIUM 9.0  --   --  8.4*  --   --   --  8.0*  --  7.9*  --   --   --  7.9*  MG 2.1  --   --  1.5*  --  2.3  --  2.2  --  2.3  --   --   --  2.2  PHOS  --   --   --   --   --   --   --  3.3  --   --   --   --   --  4.6   < > = values in this interval not displayed.    Liver Function Tests: Recent Labs  Lab 09/18/21 0407 09/19/21 0358  AST 122* 113*  ALT 26 9  ALKPHOS 49 67  BILITOT 3.1* 4.3*  PROT 4.7* 4.5*  ALBUMIN 2.6* 2.3*   No results for input(s): LIPASE, AMYLASE in the last 168 hours. No results for input(s): AMMONIA in the last 168 hours.  CBC: Recent Labs  Lab 09/17/21 2057 09/17/21 2106 09/18/21 0407 09/18/21 0451 09/18/21 1505 09/18/21 1804 09/18/21 1855 09/18/21 2222 09/19/21 0355 09/19/21 0358  WBC 17.0*  --  14.1*  --  12.3*  --   --  13.8*  --  13.6*  NEUTROABS  --   --  9.7*  --   --   --   --   --   --  11.4*  HGB 7.6*   < > 6.5*   < > 7.6* 7.8* 8.5* 8.3* 7.5* 7.5*  HCT 22.9*   < > 18.8*   < > 21.8* 23.0* 25.0* 22.3* 22.0* 21.2*  MCV 90.9  --  89.5  --  83.8  --   --  83.5  --  84.8  PLT 82*  --  116*  --  113*  --   --  118*  --  116*   < > = values in this interval not displayed.    INR: Recent Labs  Lab 09/17/21 1555 09/18/21 0407 09/19/21 0358  INR 1.7*   1.8* 1.6* 1.8*    Other results: EKG:   Imaging: DG CHEST PORT 1 VIEW  Result Date: 09/19/2021 CLINICAL DATA:  Chest tube present, status post LVAD EXAM: PORTABLE CHEST 1 VIEW COMPARISON:  Chest radiograph 1 day prior FINDINGS: The patient has been extubated. The enteric catheter has been removed. The left IJ Swan-Ganz catheter is in stable position terminating in the right pulmonary artery. The left chest wall cardiac device and associated leads are stable. The  LVAD is stable. Mediastinal drains and bilateral chest tubes are stable. Aeration of the lungs is unchanged. There is no new or worsening focal airspace disease. The left costophrenic angle is not well evaluated due to the LVAD. There is no significant right effusion. There is no pneumothorax. The bones are stable. IMPRESSION: 1. Interval extubation.  Remaining support devices as above. 2. No significant interval change in lung aeration since the study from 1 day prior. Electronically Signed   By: Valetta Mole M.D.   On: 09/19/2021 08:15   DG Chest Port 1 View  Result Date: 09/18/2021 CLINICAL DATA:  Intubated, chest tube present, status post LVAD EXAM: PORTABLE CHEST 1 VIEW COMPARISON:  Chest radiograph 1 day prior FINDINGS: Endotracheal tube tip is approximately 4.9 cm from the carina. The left chest wall cardiac device and associated leads are stable. There is a left IJ Swan-Ganz catheter terminating in the right pulmonary artery. The left ventricular cyst device is stable. Bilateral chest tubes are stable. Presumed mediastinal drain is noted. The enteric catheter courses off the field of view. The side hole is no longer identified. The heart is enlarged, unchanged. The mediastinal contours are stable. Aeration of the lungs is not significantly changed. There is no new or worsening focal airspace disease. There is no significant pleural effusion. There is no pneumothorax. The bones are stable. IMPRESSION: 1. Lines and tubes as above. 2. No significant  change in lung aeration. No new or worsening airspace disease. Electronically Signed   By: Valetta Mole M.D.   On: 09/18/2021 08:40   DG Chest Port 1 View  Result Date: 09/17/2021 CLINICAL DATA:  Implantable left ventricular assist device EXAM: PORTABLE CHEST 1 VIEW COMPARISON:  Radiograph 09/12/2021 FINDINGS: Interval median sternotomy with LVAD implantation. Endotracheal tube overlies the upper thoracic trachea. Pulmonary artery catheter tip overlies the  right pulmonary artery. Unchanged pacemaker/AICD leads. Bilateral chest tubes are in place. There is a mediastinal drain in place. Orogastric tube side port overlies the gastroesophageal junction. Impella device has been removed. Mildly enlarged cardiac silhouette. Mild diffuse interstitial opacities. No large pleural effusion. No visible pneumothorax. Right axillary surgical clips. IMPRESSION: Postoperative chest after LVAD placement. Lines and tubes as described above, of note the orogastric tube side port overlies the gastroesophageal junction, recommend advancement. Mild pulmonary edema. Electronically Signed   By: Maurine Simmering M.D.   On: 09/17/2021 16:33   [place] EKG SITE RITE  Result Date: 09/19/2021 If Site Rite image not attached, placement could not be confirmed due to current cardiac rhythm.    Medications:     Scheduled Medications:  sodium chloride   Intravenous Once   sodium chloride   Intravenous Once   acetaminophen  1,000 mg Oral Q6H   Or   acetaminophen (TYLENOL) oral liquid 160 mg/5 mL  1,000 mg Per Tube Q6H   aspirin  324 mg Oral Daily   bisacodyl  10 mg Oral Daily   Or   bisacodyl  10 mg Rectal Daily   chlorhexidine gluconate (MEDLINE KIT)  15 mL Mouth Rinse BID   Chlorhexidine Gluconate Cloth  6 each Topical Daily   docusate sodium  200 mg Oral Daily   feeding supplement  237 mL Oral TID BM   insulin aspart  0-15 Units Subcutaneous Q4H   mouth rinse  15 mL Mouth Rinse BID   montelukast  5 mg Oral QHS   multivitamin with minerals  1 tablet Oral Daily   pantoprazole  40 mg Oral Daily   rosuvastatin  10 mg Oral Daily   sodium chloride flush  10-40 mL Intracatheter Q12H   sodium chloride flush  3 mL Intravenous Q12H    Infusions:  sodium chloride 20 mL/hr at 09/19/21 0700   sodium chloride Stopped (09/18/21 0803)   sodium chloride 20 mL/hr at 09/17/21 1530   amiodarone 30 mg/hr (09/19/21 0700)   epinephrine 6 mcg/min (09/19/21 0700)   lactated ringers      lactated ringers 20 mL/hr at 09/18/21 1756   milrinone 0.25 mcg/kg/min (09/19/21 0700)   norepinephrine (LEVOPHED) Adult infusion 4 mcg/min (09/19/21 0700)    PRN Medications: sodium chloride, albuterol, morphine injection, ondansetron (ZOFRAN) IV, oxyCODONE, sodium chloride flush, sodium chloride flush, traMADol   Assessment:   POD 2 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 Moderate RV systolic dysfunction by echo preop but looked better after LVAD insertion in OR. 2.   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 Decided against removal of ICD/pacer and TV replacement. Improved a little at the end of surgery. 3.  Hx of atrial fib on amio. Looks sinus this am. 4.   Stage 3 CKD with creat up a little to 2.39 this am. Preop was as low as 1.49 and then increased to 1.7 before surgery after large  diuresis the day before. UO has still been 40-50/hr. Expect this to plateau and improve. 5.   CAD with PCI of OM1 in 2007 and RCA in 2013. 6.   Acute postop blood loss anemia. Hgb down this am to 7.5 and transfused 1 unit PRBC's. Chest tube output low and thin. JP drainage minimal now. I suspect this drop is partly from JP drainage and partly equilibration.     Plan/Discussion:    DC MT's. Keep pocket drain and pleural tubes.  Hold off on anticoagulation today. May start it tomorrow if JP drainage stays low and Hgb stabilizes.  PICC line today and DC swan.  Received a dose of lasix this am.   OOB to chair. IS  Taking liquids po. Advance as tolerated.  I reviewed the LVAD parameters from today, and compared the results to the patient's prior recorded data.  No programming changes were made.  The LVAD is functioning within specified parameters.    Length of Stay: [address]  Fernande Boyden [facility] 09/19/2021, 10:44 AM

## 2021-09-19 NOTE — Progress Notes (Signed)
Inpatient Diabetes Program Recommendations  AACE/ADA: New Consensus Statement on Inpatient Glycemic Control (2015)  Target Ranges:  Prepandial:   less than 140 mg/dL      Peak postprandial:   less than 180 mg/dL (1-2 hours)      Critically ill patients:  140 - 180 mg/dL   Lab Results  Component Value Date   GLUCAP 63 (L) 09/19/2021   HGBA1C 6.1 (H) 08/23/2021    Review of Glycemic Control  Latest Reference Range & Units 09/18/21 09:03 09/18/21 10:20 09/18/21 10:59 09/18/21 15:11 09/18/21 20:17  Glucose-Capillary 70 - 99 mg/dL 115 (H) 139 (H) 125 (H) Novolog 2 units 122 (H) Novolog 2 units 85    Latest Reference Range & Units 09/19/21 03:37 09/19/21 03:53 09/19/21 05:54 09/19/21 06:10 09/19/21 08:15  Glucose-Capillary 70 - 99 mg/dL 39 (LL) 125 (H) 63 (L) 119 (H) 63 (L)    Inpatient Diabetes Program Recommendations:   Patient is very sensitive to insulin. Please consider: -Decrease Novolog correction to 0-6 units q 4 hrs.  Thank you, Nani Gasser. Natara Monfort, RN, MSN, CDE  Diabetes Coordinator Inpatient Glycemic Control Team Team Pager [phone] (8am-5pm) 09/19/2021 9:22 AM

## 2021-09-19 NOTE — Progress Notes (Signed)
PT Cancellation Note  Patient Details Name: Isaac [name] MRN: [id number] DOB: [birth year]   Cancelled Treatment:    Reason Eval/Treat Not Completed: Patient not medically ready (pt remains with swan and fem art line in and await removal for mobility)   Isaac [name] Isaac [name] 09/19/2021, 11:37 AM Isaac [name], PT Acute Rehabilitation Services Pager: [phone] Office: [phone]

## 2021-09-19 NOTE — Progress Notes (Signed)
Peripherally Inserted Central Catheter Placement  The IV Nurse has discussed with the patient and/or persons authorized to consent for the patient, the purpose of this procedure and the potential benefits and risks involved with this procedure.  The benefits include less needle sticks, lab draws from the catheter, and the patient may be discharged home with the catheter. Risks include, but not limited to, infection, bleeding, blood clot (thrombus formation), and puncture of an artery; nerve damage and irregular heartbeat and possibility to perform a PICC exchange if needed/ordered by physician.  Alternatives to this procedure were also discussed.  Bard Power PICC patient education guide, fact sheet on infection prevention and patient information card has been provided to patient /or left at bedside.    PICC Placement Documentation  PICC Triple Lumen 09/19/21 PICC Right Brachial 38 cm 0 cm (Active)  Indication for Insertion or Continuance of Line Prolonged intravenous therapies 09/19/21 1120  Exposed Catheter (cm) 0 cm 09/19/21 1120  Site Assessment Clean;Dry;Intact 09/19/21 1120  Lumen #1 Status Flushed;Saline locked;Blood return noted 09/19/21 1120  Lumen #2 Status Blood return noted;Saline locked;Flushed 09/19/21 1120  Lumen #3 Status Blood return noted;Saline locked;Flushed 09/19/21 1120  Dressing Type Transparent;Securing device 09/19/21 1120  Antimicrobial disc in place? Yes 09/19/21 1120  Safety Lock Not Applicable 17/51/02 5852  Dressing Change Due 09/26/21 09/19/21 1120  Consent on chart     Frances Maywood 09/19/2021, 11:23 AM

## 2021-09-20 ENCOUNTER — Inpatient Hospital Stay (HOSPITAL_COMMUNITY): Payer: Medicare HMO

## 2021-09-20 DIAGNOSIS — Z95811 Presence of heart assist device: Secondary | ICD-10-CM | POA: Diagnosis not present

## 2021-09-20 DIAGNOSIS — R570 Cardiogenic shock: Secondary | ICD-10-CM | POA: Diagnosis not present

## 2021-09-20 DIAGNOSIS — I5043 Acute on chronic combined systolic (congestive) and diastolic (congestive) heart failure: Secondary | ICD-10-CM | POA: Diagnosis not present

## 2021-09-20 LAB — COOXEMETRY PANEL
Carboxyhemoglobin: 1.3 % (ref 0.5–1.5)
Carboxyhemoglobin: 1.6 % — ABNORMAL HIGH (ref 0.5–1.5)
Methemoglobin: 1.2 % (ref 0.0–1.5)
Methemoglobin: 1.1 % (ref 0.0–1.5)
O2 Saturation: 53 %
O2 Saturation: 64.4 %
Total hemoglobin: 7.9 g/dL — ABNORMAL LOW (ref 12.0–16.0)
Total hemoglobin: 7.4 g/dL — ABNORMAL LOW (ref 12.0–16.0)

## 2021-09-20 LAB — COMPREHENSIVE METABOLIC PANEL
ALT: 5 U/L (ref 0–44)
AST: 86 U/L — ABNORMAL HIGH (ref 15–41)
Albumin: 2.3 g/dL — ABNORMAL LOW (ref 3.5–5.0)
Alkaline Phosphatase: 74 U/L (ref 38–126)
Anion gap: 11 (ref 5–15)
BUN: 38 mg/dL — ABNORMAL HIGH (ref 8–23)
CO2: 23 mmol/L (ref 22–32)
Calcium: 7.9 mg/dL — ABNORMAL LOW (ref 8.9–10.3)
Chloride: 93 mmol/L — ABNORMAL LOW (ref 98–111)
Creatinine, Ser: 2.61 mg/dL — ABNORMAL HIGH (ref 0.61–1.24)
GFR, Estimated: 26 mL/min — ABNORMAL LOW (ref >60)
Glucose, Bld: 357 mg/dL — ABNORMAL HIGH (ref 70–99)
Potassium: 3.7 mmol/L (ref 3.5–5.1)
Sodium: 127 mmol/L — ABNORMAL LOW (ref 135–145)
Total Bilirubin: 4.7 mg/dL — ABNORMAL HIGH (ref 0.3–1.2)
Total Protein: 4.8 g/dL — ABNORMAL LOW (ref 6.5–8.1)

## 2021-09-20 LAB — BPAM RBC
Blood Product Expiration Date: 202301162359
Blood Product Expiration Date: 202301162359
Blood Product Expiration Date: 202301162359
Blood Product Expiration Date: 202301162359
Blood Product Expiration Date: 202301172359
Blood Product Expiration Date: 202301172359
ISSUE DATE / TIME: 202212280745
ISSUE DATE / TIME: 202212280745
ISSUE DATE / TIME: 202212290531
ISSUE DATE / TIME: 202212290531
ISSUE DATE / TIME: 202212291723
ISSUE DATE / TIME: 202212300452
Unit Type and Rh: 5100
Unit Type and Rh: 5100
Unit Type and Rh: 5100
Unit Type and Rh: 5100
Unit Type and Rh: 5100
Unit Type and Rh: 5100

## 2021-09-20 LAB — CBC
HCT: 22.2 % — ABNORMAL LOW (ref 39.0–52.0)
Hemoglobin: 8 g/dL — ABNORMAL LOW (ref 13.0–17.0)
MCH: 30.5 pg (ref 26.0–34.0)
MCHC: 36 g/dL (ref 30.0–36.0)
MCV: 84.7 fL (ref 80.0–100.0)
Platelets: 152 10*3/uL (ref 150–400)
RBC: 2.62 MIL/uL — ABNORMAL LOW (ref 4.22–5.81)
RDW: 16.6 % — ABNORMAL HIGH (ref 11.5–15.5)
WBC: 17.7 10*3/uL — ABNORMAL HIGH (ref 4.0–10.5)
nRBC: 0 % (ref 0.0–0.2)

## 2021-09-20 LAB — GLUCOSE, CAPILLARY
Glucose-Capillary: 200 mg/dL — ABNORMAL HIGH (ref 70–99)
Glucose-Capillary: 218 mg/dL — ABNORMAL HIGH (ref 70–99)
Glucose-Capillary: 234 mg/dL — ABNORMAL HIGH (ref 70–99)
Glucose-Capillary: 278 mg/dL — ABNORMAL HIGH (ref 70–99)
Glucose-Capillary: 286 mg/dL — ABNORMAL HIGH (ref 70–99)
Glucose-Capillary: 342 mg/dL — ABNORMAL HIGH (ref 70–99)
Glucose-Capillary: 44 mg/dL — CL (ref 70–99)
Glucose-Capillary: 47 mg/dL — ABNORMAL LOW (ref 70–99)
Glucose-Capillary: 56 mg/dL — ABNORMAL LOW (ref 70–99)
Glucose-Capillary: 62 mg/dL — ABNORMAL LOW (ref 70–99)
Glucose-Capillary: 66 mg/dL — ABNORMAL LOW (ref 70–99)
Glucose-Capillary: 67 mg/dL — ABNORMAL LOW (ref 70–99)
Glucose-Capillary: 78 mg/dL (ref 70–99)

## 2021-09-20 LAB — TYPE AND SCREEN
ABO/RH(D): O POS
Antibody Screen: NEGATIVE
Unit division: 0
Unit division: 0
Unit division: 0
Unit division: 0
Unit division: 0
Unit division: 0

## 2021-09-20 LAB — BASIC METABOLIC PANEL
Anion gap: 9 (ref 5–15)
BUN: 40 mg/dL — ABNORMAL HIGH (ref 8–23)
CO2: 25 mmol/L (ref 22–32)
Calcium: 8.1 mg/dL — ABNORMAL LOW (ref 8.9–10.3)
Chloride: 99 mmol/L (ref 98–111)
Creatinine, Ser: 3.13 mg/dL — ABNORMAL HIGH (ref 0.61–1.24)
GFR, Estimated: 21 mL/min — ABNORMAL LOW (ref >60)
Glucose, Bld: 77 mg/dL (ref 70–99)
Potassium: 4.3 mmol/L (ref 3.5–5.1)
Sodium: 133 mmol/L — ABNORMAL LOW (ref 135–145)

## 2021-09-20 LAB — CBC WITH DIFFERENTIAL/PLATELET
Abs Immature Granulocytes: 0.18 10*3/uL — ABNORMAL HIGH (ref 0.00–0.07)
Basophils Absolute: 0 10*3/uL (ref 0.0–0.1)
Basophils Relative: 0 %
Eosinophils Absolute: 0.5 10*3/uL (ref 0.0–0.5)
Eosinophils Relative: 3 %
HCT: 21.5 % — ABNORMAL LOW (ref 39.0–52.0)
Hemoglobin: 7.9 g/dL — ABNORMAL LOW (ref 13.0–17.0)
Immature Granulocytes: 1 %
Lymphocytes Relative: 4 %
Lymphs Abs: 0.6 10*3/uL — ABNORMAL LOW (ref 0.7–4.0)
MCH: 31.2 pg (ref 26.0–34.0)
MCHC: 36.7 g/dL — ABNORMAL HIGH (ref 30.0–36.0)
MCV: 85 fL (ref 80.0–100.0)
Monocytes Absolute: 1.1 10*3/uL — ABNORMAL HIGH (ref 0.1–1.0)
Monocytes Relative: 6 %
Neutro Abs: 14.4 10*3/uL — ABNORMAL HIGH (ref 1.7–7.7)
Neutrophils Relative %: 86 %
Platelets: 121 10*3/uL — ABNORMAL LOW (ref 150–400)
RBC: 2.53 MIL/uL — ABNORMAL LOW (ref 4.22–5.81)
RDW: 16.3 % — ABNORMAL HIGH (ref 11.5–15.5)
WBC: 16.7 10*3/uL — ABNORMAL HIGH (ref 4.0–10.5)
nRBC: 0 % (ref 0.0–0.2)

## 2021-09-20 LAB — POCT I-STAT 7, (LYTES, BLD GAS, ICA,H+H)
Acid-Base Excess: 1 mmol/L (ref 0.0–2.0)
Bicarbonate: 23.8 mmol/L (ref 20.0–28.0)
Calcium, Ion: 1.11 mmol/L — ABNORMAL LOW (ref 1.15–1.40)
HCT: 23 % — ABNORMAL LOW (ref 39.0–52.0)
Hemoglobin: 7.8 g/dL — ABNORMAL LOW (ref 13.0–17.0)
O2 Saturation: 98 %
Patient temperature: 36.3
Potassium: 3.9 mmol/L (ref 3.5–5.1)
Sodium: 134 mmol/L — ABNORMAL LOW (ref 135–145)
TCO2: 25 mmol/L (ref 22–32)
pCO2 arterial: 27.9 mmHg — ABNORMAL LOW (ref 32.0–48.0)
pH, Arterial: 7.536 — ABNORMAL HIGH (ref 7.350–7.450)
pO2, Arterial: 87 mmHg (ref 83.0–108.0)

## 2021-09-20 LAB — MAGNESIUM: Magnesium: 2.2 mg/dL (ref 1.7–2.4)

## 2021-09-20 LAB — LACTATE DEHYDROGENASE: LDH: 376 U/L — ABNORMAL HIGH (ref 98–192)

## 2021-09-20 LAB — PROTIME-INR
INR: 1.5 — ABNORMAL HIGH (ref 0.8–1.2)
Prothrombin Time: 17.8 seconds — ABNORMAL HIGH (ref 11.4–15.2)

## 2021-09-20 LAB — PHOSPHORUS: Phosphorus: 4.1 mg/dL (ref 2.5–4.6)

## 2021-09-20 LAB — AMMONIA: Ammonia: <10 umol/L (ref 9–35)

## 2021-09-20 MED ORDER — POTASSIUM CHLORIDE 10 MEQ/50ML IV SOLN
10.0000 meq | INTRAVENOUS | Status: AC
  Administered 2021-09-20 (×4): 10 meq via INTRAVENOUS
  Filled 2021-09-20 (×2): qty 50

## 2021-09-20 MED ORDER — DEXTROSE 50 % IV SOLN: 12.5000 g | INTRAVENOUS | Status: AC

## 2021-09-20 MED ORDER — WARFARIN SODIUM 2.5 MG PO TABS
2.5000 mg | ORAL_TABLET | Freq: Once | ORAL | Status: DC
  Filled 2021-09-20: qty 1

## 2021-09-20 MED ORDER — WARFARIN - PHARMACIST DOSING INPATIENT
Freq: Every day | Status: DC
  Administered 2021-10-21: 1

## 2021-09-20 MED ORDER — FUROSEMIDE 10 MG/ML IJ SOLN
80.0000 mg | Freq: Two times a day (BID) | INTRAMUSCULAR | Status: AC
  Administered 2021-09-20 (×2): 80 mg via INTRAVENOUS
  Filled 2021-09-20 (×2): qty 8

## 2021-09-20 MED ORDER — DEXTROSE 50 % IV SOLN
INTRAVENOUS | Status: AC
  Administered 2021-09-20: 12.5 g via INTRAVENOUS
  Filled 2021-09-20: qty 50

## 2021-09-20 MED ORDER — DEXTROSE 50 % IV SOLN
INTRAVENOUS | Status: AC
  Filled 2021-09-20: qty 50

## 2021-09-20 MED ORDER — DEXMEDETOMIDINE HCL IN NACL 400 MCG/100ML IV SOLN
0.4000 ug/kg/h | INTRAVENOUS | Status: DC
  Administered 2021-09-20: 0.63 ug/kg/h via INTRAVENOUS
  Administered 2021-09-20: 0.4 ug/kg/h via INTRAVENOUS
  Filled 2021-09-20 (×3): qty 100

## 2021-09-20 MED ORDER — HEPARIN (PORCINE) 25000 UT/250ML-% IV SOLN
500.0000 [IU]/h | INTRAVENOUS | Status: DC
  Administered 2021-09-20 – 2021-09-23 (×2): 500 [IU]/h via INTRAVENOUS
  Filled 2021-09-20 (×2): qty 250

## 2021-09-20 MED ORDER — POTASSIUM CHLORIDE 10 MEQ/50ML IV SOLN
10.0000 meq | INTRAVENOUS | Status: AC
  Administered 2021-09-20 (×3): 10 meq via INTRAVENOUS
  Filled 2021-09-20 (×3): qty 50

## 2021-09-20 NOTE — Progress Notes (Signed)
Drive Line: Existing VAD dressing removed and site care performed using sterile technique. Drive line exit site cleaned with Chlora prep applicators x 2, allowed to dry, and gauze dressing with silver strip applied. Scant amount of bloody drainage noted at site and on guaze. No redness, tenderness, foul odor or rash noted. 2 drive line anchors re-applied.  Daily dressing changes per Nurse Davonna Belling or [place] coordinator. Next dressing change due: 09/21/21.

## 2021-09-20 NOTE — Progress Notes (Signed)
ANTICOAGULATION CONSULT NOTE - Initial Consult  Pharmacy Consult for Warfarin>heparin Indication: LVAD/AF  Allergies  Allergen Reactions   Mushroom Extract Complex Nausea And Vomiting   Neosporin [Neomycin-Bacitracin Zn-Polymyx] Hives    Patient Measurements: Height: 5\' 9"  (175.3 cm) Weight: 83.7 kg (184 lb 8.4 oz) IBW/kg (Calculated) : 70.7 Heparin Dosing Weight: 83.7 kg  Vital Signs: Temp: 97.6 F (36.4 C) (12/31 0754) Temp Source: Oral (12/31 0754) Pulse Rate: 116 (12/31 1430)  Labs: Recent Labs    09/18/21 0407 09/18/21 0451 09/19/21 0358 09/19/21 1042 09/19/21 1524 09/19/21 1708 09/19/21 2146 09/20/21 0211 09/20/21 0251 09/20/21 1709  HGB 6.5*   < > 7.5* 8.7*   < >  --    < > 7.8* 7.9* 8.0*  HCT 18.8*   < > 21.2* 23.6*   < >  --    < > 23.0* 21.5* 22.2*  PLT 116*   < > 116* 113*  --   --   --   --  121* 152  LABPROT 18.9*  --  20.6*  --   --   --   --   --  17.8*  --   INR 1.6*  --  1.8*  --   --   --   --   --  1.5*  --   CREATININE 2.11*   < > 2.39*  --   --  2.51*  --   --  2.61*  --    < > = values in this interval not displayed.     Estimated Creatinine Clearance: 26.7 mL/min (A) (by C-G formula based on SCr of 2.61 mg/dL (H)).   Medical History: Past Medical History:  Diagnosis Date   Arrhythmia    atrial fibrillation   CHF (congestive heart failure) (HCC)    Chronic kidney disease    Coronary artery disease    Hyperlipidemia    Hypertension    Myocardial infarct [facility])     Assessment: 70 yo male on chronic Eliquis PTA for afib.  Now s/p LVAD placement 09/17/21, pharmacy asked to begin anticoagulation with Coumadin today.  Previously this admission had hematoma at Impella site and has required multiple blood products.  Hemoglobin has remained low, but appears pretty stable.  Alerted by nursing this evening that patient is too delirious for initial dose of warfarin tonight. Discussed with cardiology and will start low dose heparin at 500  units/hr with no plans for titration. Will check daily heparin level to ensure levels are not elevated.   Goal of Therapy:  Heparin level <0.3 INR 2-2.5 Monitor platelets by anticoagulation protocol: Yes   Plan:  Start heparin at 500 units/hr - will not titrate overnight Daily heparin level with cbc and INR Monitor for S/S of bleeding.  Erin Hearing PharmD., BCPS Clinical Pharmacist 09/20/2021 6:02 PM

## 2021-09-20 NOTE — Progress Notes (Signed)
PT Cancellation Note  Patient Details Name: Isaac [name] MRN: [id number] DOB: Sep 17, 1951   Cancelled Treatment:    Reason Eval/Treat Not Completed: Patient not medically ready. Pt delirious and hasn't slept. Now on precedex. Will try again tomorrow.   Shary Decamp [facility] 09/20/2021, 10:51 AM [place] Pager [phone] Office [phone]

## 2021-09-20 NOTE — Progress Notes (Signed)
° °   °  [address]       [place] [address]             [phone]      Unusual affect persists, still hasn't slept.  Agitated earlier but calmer since starting on Precedex this AM  Hemodynamics stable AV paced at 90 on amiodarone at 30 CVP 17 Milrinone 0.25, epi 6, norepi 5 Co-ox 53  LVAD INTERROGATION:  HeartMate III LVAD:   Flow 4.5 liters/min, speed 5400, power 4.0, PI 3.3.  1 PI event.   LDH 344 => 379 => 376   Plts 113 => 121 Hgb 7.9  Will dc mediastinal CT, keep pleural tubes in place Output from JP ~15 ml Ok to start coumadin- d/w Dr. Lear Ng C. Roxan Hockey, MD [facility] [phone]

## 2021-09-20 NOTE — Progress Notes (Signed)
Patient ID: Isaac [name], male   DOB: June 20, 1951, 70 y.o.   MRN: [id number]   Advanced Heart Failure VAD Team Note  PCP-Cardiologist: None   Subjective:    12/19 RHC- RA 7, PA 39/14 (25), PCWP 11, CO 6.4 CI 3. Thermo 3.5 1.8. Lasix drip stopped after cath. 12/20 Swan removed.  Norepi 3 mcg added.  12/21 Fever. Blood CX drawn. UA + leukocytes. Started on vanc and cefepime. Diuresed with IV lasix + metolazone.  12/23 Impella 5.5 placed 12/28 HM III LVAD, ICD leads plastered to tricuspid valve with severe TR, valve not replaced and some improvement in TR with LVAD placement.  12/29 Extubated  On milrinone 0.25, NE 5, epinephrine 6, amiodarone gtt 30.  MAP 70s-80s.  Co-ox 53% but drawn early am.   D10 gtt 40 cc/hr started yesterday with hypoglycemia.   Creatinine higher at 2.39 => 2.61. I/Os net negative 1136 with IV Lasix bolus.  CVP up to 20 today.   There was large hematoma at Impella 5.5 site, JP drain placed after removal, now with less output (only 15 cc overnight).    He is confused/agitated this morning.  Has not slept in > 2 days. NH3 low. Afebrile with WBCs 16.   LVAD INTERROGATION:  HeartMate III LVAD:   Flow 4.5 liters/min, speed 5400, power 4.0, PI 3.3.  1 PI event.   LDH 344 => 379 => 376  Plts 113 => 121 Hgb 7.9  Objective:    Vital Signs:   Temp:  [96.1 F (35.6 C)-97.6 F (36.4 C)] 97.6 F (36.4 C) (12/31 0754) Pulse Rate:  [79-196] 98 (12/31 0630) Resp:  [15-34] 24 (12/31 0645) SpO2:  [75 %-100 %] 93 % (12/31 0630) Arterial Line BP: (67-90)/(51-75) 82/71 (12/30 1730) Weight:  [83.7 kg] 83.7 kg (12/31 0500) Last BM Date: 09/10/21 Mean arterial Pressure  70s-80s  Intake/Output:   Intake/Output Summary (Last 24 hours) at 09/20/2021 0847 Last data filed at 09/20/2021 0648 Gross per 24 hour  Intake 1782.45 ml  Output 2920 ml  Net -1137.55 ml     Physical Exam    General: Agitated.  HEENT: Normal. Neck: Supple, JVP 14+ cm. Carotids OK.   Cardiac:  Mechanical heart sounds with LVAD hum present.  Lungs:  CTAB, normal effort.  Abdomen:  NT, ND, no HSM. No bruits or masses. +BS  LVAD exit site: Well-healed and incorporated. Dressing dry and intact. No erythema or drainage. Stabilization device present and accurately applied. Driveline dressing changed daily per sterile technique. Extremities:  Warm and dry. No cyanosis, clubbing, rash, or edema.  Neuro:  Alert but confused.   Telemetry   A-V paced (personally reviewed)   Labs   Basic Metabolic Panel: Recent Labs  Lab 09/17/21 2057 09/17/21 2106 09/18/21 0407 09/18/21 0451 09/18/21 1505 09/18/21 1804 09/19/21 0358 09/19/21 1524 09/19/21 1708 09/19/21 2146 09/19/21 2343 09/20/21 0211 09/20/21 0251  NA  --    < > 130*   < > 133*   < > 132*   < > 132* 135 134* 134* 127*  K 4.0   < > 4.2   < > 4.8   < > 4.3   < > 4.2 3.9 3.8 3.9 3.7  CL  --   --  98  --  99  --  101  --  98  --   --   --  93*  CO2  --   --  21*  --  23  --  23  --  24  --   --   --  23  GLUCOSE  --   --  118*  --  120*  --  127*  --  57*  --   --   --  357*  BUN  --   --  31*  --  34*  --  36*  --  38*  --   --   --  38*  CREATININE 1.75*  --  2.11*  --  2.20*  --  2.39*  --  2.51*  --   --   --  2.61*  CALCIUM  --   --  8.0*  --  7.9*  --  7.9*  --  8.1*  --   --   --  7.9*  MG 2.3  --  2.2  --  2.3  --  2.2  --   --   --   --   --  2.2  PHOS  --   --  3.3  --   --   --  4.6  --   --   --   --   --  4.1   < > = values in this interval not displayed.    Liver Function Tests: Recent Labs  Lab 09/18/21 0407 09/19/21 0358 09/20/21 0251  AST 122* 113* 86*  ALT 26 9 5   ALKPHOS 49 67 74  BILITOT 3.1* 4.3* 4.7*  PROT 4.7* 4.5* 4.8*  ALBUMIN 2.6* 2.3* 2.3*   No results for input(s): LIPASE, AMYLASE in the last 168 hours. Recent Labs  Lab 09/20/21 0701  AMMONIA <10    CBC: Recent Labs  Lab 09/18/21 0407 09/18/21 0451 09/18/21 1505 09/18/21 1804 09/18/21 2222 09/19/21 0355  09/19/21 0358 09/19/21 1042 09/19/21 1524 09/19/21 2146 09/19/21 2343 09/20/21 0211 09/20/21 0251  WBC 14.1*  --  12.3*  --  13.8*  --  13.6* 16.3*  --   --   --   --  16.7*  NEUTROABS 9.7*  --   --   --   --   --  11.4*  --   --   --   --   --  14.4*  HGB 6.5*   < > 7.6*   < > 8.3*   < > 7.5* 8.7* 8.8* 8.2* 8.5* 7.8* 7.9*  HCT 18.8*   < > 21.8*   < > 22.3*   < > 21.2* 23.6* 26.0* 24.0* 25.0* 23.0* 21.5*  MCV 89.5  --  83.8  --  83.5  --  84.8 82.2  --   --   --   --  85.0  PLT 116*  --  113*  --  118*  --  116* 113*  --   --   --   --  121*   < > = values in this interval not displayed.    INR: Recent Labs  Lab 09/17/21 1555 09/18/21 0407 09/19/21 0358 09/20/21 0251  INR 1.7*   1.8* 1.6* 1.8* 1.5*    Other results: EKG:    Imaging   DG CHEST PORT 1 VIEW  Result Date: 09/19/2021 CLINICAL DATA:  Chest tube present, status post LVAD EXAM: PORTABLE CHEST 1 VIEW COMPARISON:  Chest radiograph 1 day prior FINDINGS: The patient has been extubated. The enteric catheter has been removed. The left IJ Swan-Ganz catheter is in stable position terminating in the right pulmonary artery. The left chest wall cardiac device and associated leads are stable. The  LVAD is stable. Mediastinal drains and bilateral chest tubes are stable. Aeration of the lungs is unchanged. There is no new or worsening focal airspace disease. The left costophrenic angle is not well evaluated due to the LVAD. There is no significant right effusion. There is no pneumothorax. The bones are stable. IMPRESSION: 1. Interval extubation.  Remaining support devices as above. 2. No significant interval change in lung aeration since the study from 1 day prior. Electronically Signed   By: Valetta Mole M.D.   On: 09/19/2021 08:15   [place] EKG SITE RITE  Result Date: 09/19/2021 If Site Rite image not attached, placement could not be confirmed due to current cardiac rhythm.    Medications:     Scheduled Medications:  sodium  chloride   Intravenous Once   sodium chloride   Intravenous Once   acetaminophen  1,000 mg Oral Q6H   Or   acetaminophen (TYLENOL) oral liquid 160 mg/5 mL  1,000 mg Per Tube Q6H   aspirin  324 mg Oral Daily   bisacodyl  10 mg Oral Daily   Or   bisacodyl  10 mg Rectal Daily   chlorhexidine gluconate (MEDLINE KIT)  15 mL Mouth Rinse BID   Chlorhexidine Gluconate Cloth  6 each Topical Daily   dextrose  25 g Intravenous STAT   dextrose       docusate sodium  200 mg Oral Daily   feeding supplement  237 mL Oral TID BM   furosemide  80 mg Intravenous BID   insulin aspart  0-15 Units Subcutaneous Q4H   mouth rinse  15 mL Mouth Rinse BID   montelukast  5 mg Oral QHS   multivitamin with minerals  1 tablet Oral Daily   pantoprazole  40 mg Oral Daily   rosuvastatin  10 mg Oral Daily   sodium chloride flush  10-40 mL Intracatheter Q12H   sodium chloride flush  10-40 mL Intracatheter Q12H   sodium chloride flush  3 mL Intravenous Q12H    Infusions:  sodium chloride Stopped (09/20/21 0304)   sodium chloride Stopped (09/18/21 0803)   sodium chloride 20 mL/hr at 09/17/21 1530   amiodarone 30 mg/hr (09/20/21 0600)   dexmedetomidine (PRECEDEX) IV infusion     dextrose 40 mL/hr at 09/20/21 0600   epinephrine 6 mcg/min (09/20/21 0600)   lactated ringers     lactated ringers 20 mL/hr at 09/18/21 1756   milrinone 0.25 mcg/kg/min (09/20/21 0600)   norepinephrine (LEVOPHED) Adult infusion 5 mcg/min (09/20/21 0600)    PRN Medications: sodium chloride, albuterol, melatonin, morphine injection, ondansetron (ZOFRAN) IV, oxyCODONE, sodium chloride flush, sodium chloride flush, sodium chloride flush, traMADol   Assessment/Plan:    1. Acute on chronic systolic CHF:  Long-standing cardiomyopathy.  [place].  Echo this admission with EF < 20%, severe LV dilation, restrictive diastolic function, moderate RV dysfunction, moderate MR, mod-severe TR. Cause of cardiomyopathy is uncertain.  He  has a history of CAD, but I do not think that the described CAD from the past could explain his cardiomyopathy, but CAD could have progressed.  With difficulty tolerating GDMT/need for midodrine and cardiorenal syndrome as well as profound volume overload + NYHA class IV symptoms,  concerned for low output HF. Co-ox off milrinone was 36%, milrinone started and increased to 0.375 but CO remained low. NE added and Impella 5.5 placed 12/23. EF 10% on TEE 12/23. s/p HM III VAD on 12/28.  Currently on NE 5, epinephrine 6, milrinone 0.25.  Co-ox low at 53% but early am draw.  LVAD parameters stable. Creatinine trending higher 2.6.  CVP elevated at 20. MAP stable. LDH stable.  - Repeat co-ox, can slowly wean NE if adequate.  - Needs diuresis at this point => Lasix 80 mg IV bid.  - Will start warfarin today per pharmacy.  2. Tricuspid regurgitation: Has moderate-severe TR.  Tricuspid repair not done at time of VAD d/t proximity of ICD wires 3. Atrial fibrillation: Persistent since 10/22 based on device interrogation.  Currently A-V paced.  - Continue amiodarone gtt (will not take pills, confused).  4. AKI on CKD stage 3: Creatinine 2.61, mildly higher this morning.    - Continue hemodynamic support.  5. CAD: History of PCI to OM1 in 2007 and RCA in 2013.  No chest pain or ACS.  - Deferred cardiac cath due to AKI and plan for VAD - Continue Crestor.  6. ID: Had fever 12/21, PCT 1.03.  Has been afebrile since.  Blood cultures negative. ?Phlebitis at IV site. Completed course of abx. WBCs elevated to 16.7 post-op but no fever.  7. L Pleural Effusion: Noted on CT.  8. Hyponatremia: Hypervolemic hyponatremia.  Na 127 today.    - Tolvaptan if drops lower.  9. Anemia: Blood loss anemia, had hematoma at Impella 5.5 site with significant drainage after removal. Hgb 7.9 today.  - Repeat CBC this afternoon and transfuse hgb < 7.5 (would like to avoid extra volume if possible).  10. Confusion/delirium: Alert today but  confused.  Has not slept in 2-3 days.  Agitated. NH3 not elevated.  No fever.  - Will start Precedex gtt.  - Wife at bedside to help re-orient.  11. F/E/N: Full liquids, will try to get him to drink Ensure today.  Has been hypoglycemic but this is improved.  - Stop D10 gtt.   I reviewed the LVAD parameters from today, and compared the results to the patient's prior recorded data.  No programming changes were made.  The LVAD is functioning within specified parameters.  The patient performs LVAD self-test daily.  LVAD interrogation was negative for any significant power changes, alarms or PI events/speed drops.  LVAD equipment check completed and is in good working order.  Back-up equipment present.   LVAD education done on emergency procedures and precautions and reviewed exit site care.  CRITICAL CARE Performed by: Loralie Champagne  Total critical care time: 40 minutes  Critical care time was exclusive of separately billable procedures and treating other patients.  Critical care was necessary to treat or prevent imminent or life-threatening deterioration.  Critical care was time spent personally by me on the following activities: development of treatment plan with patient and/or surrogate as well as nursing, discussions with consultants, evaluation of patient's response to treatment, examination of patient, obtaining history from patient or surrogate, ordering and performing treatments and interventions, ordering and review of laboratory studies, ordering and review of radiographic studies, pulse oximetry and re-evaluation of patient's condition.  Loralie Champagne 09/20/2021 8:47 AM

## 2021-09-20 NOTE — Progress Notes (Signed)
Pt refuses to take pills po, including Warfarin ordered at 1600.  Dr Aundra Dubin made aware.  Orders received.

## 2021-09-20 NOTE — Progress Notes (Signed)
ANTICOAGULATION CONSULT NOTE - Initial Consult  Pharmacy Consult for Warfarin Indication: LVAD/AF  Allergies  Allergen Reactions   Mushroom Extract Complex Nausea And Vomiting   Neosporin [Neomycin-Bacitracin Zn-Polymyx] Hives    Patient Measurements: Height: 5\' 9"  (175.3 cm) Weight: 83.7 kg (184 lb 8.4 oz) IBW/kg (Calculated) : 70.7 Heparin Dosing Weight: 83.7 kg  Vital Signs: Temp: 97.6 F (36.4 C) (12/31 0754) Temp Source: Oral (12/31 0754) Pulse Rate: 98 (12/31 0630)  Labs: Recent Labs    09/17/21 1555 09/17/21 1605 09/18/21 0407 09/18/21 0451 09/19/21 0358 09/19/21 1042 09/19/21 1524 09/19/21 1708 09/19/21 2146 09/19/21 2343 09/20/21 0211 09/20/21 0251  HGB 9.4*   < > 6.5*   < > 7.5* 8.7*   < >  --    < > 8.5* 7.8* 7.9*  HCT 27.3*   < > 18.8*   < > 21.2* 23.6*   < >  --    < > 25.0* 23.0* 21.5*  PLT 69*   69*   < > 116*   < > 116* 113*  --   --   --   --   --  121*  APTT 56*   61*  --   --   --   --   --   --   --   --   --   --   --   LABPROT 20.3*   20.8*  --  18.9*  --  20.6*  --   --   --   --   --   --  17.8*  INR 1.7*   1.8*  --  1.6*  --  1.8*  --   --   --   --   --   --  1.5*  CREATININE 1.65*   < > 2.11*   < > 2.39*  --   --  2.51*  --   --   --  2.61*   < > = values in this interval not displayed.    Estimated Creatinine Clearance: 26.7 mL/min (A) (by C-G formula based on SCr of 2.61 mg/dL (H)).   Medical History: Past Medical History:  Diagnosis Date   Arrhythmia    atrial fibrillation   CHF (congestive heart failure) (HCC)    Chronic kidney disease    Coronary artery disease    Hyperlipidemia    Hypertension    Myocardial infarct [facility])     Assessment: 70 yo male on chronic Eliquis PTA for afib.  Now s/p LVAD placement 09/17/21, pharmacy asked to begin anticoagulation with Coumadin today.  Previously this admission had hematoma at Impella site and has required multiple blood products.  Hemoglobin has remained low, but appears pretty  stable.  Goal of Therapy:  INR 2-2.5 Monitor platelets by anticoagulation protocol: Yes   Plan:  Start Coumadin with 2.5 mg po x 1 tonight. Daily INR. Monitor for S/S of bleeding.  [place] Crane, Roylene Reason, BCCP Clinical Pharmacist  09/20/2021 9:39 AM   [facility] pharmacy phone numbers are listed on [email or web address]

## 2021-09-21 ENCOUNTER — Inpatient Hospital Stay (HOSPITAL_COMMUNITY): Payer: Medicare HMO

## 2021-09-21 DIAGNOSIS — I509 Heart failure, unspecified: Secondary | ICD-10-CM | POA: Diagnosis not present

## 2021-09-21 DIAGNOSIS — R570 Cardiogenic shock: Secondary | ICD-10-CM | POA: Diagnosis not present

## 2021-09-21 DIAGNOSIS — Z95811 Presence of heart assist device: Secondary | ICD-10-CM | POA: Diagnosis not present

## 2021-09-21 LAB — GLUCOSE, CAPILLARY
Glucose-Capillary: 42 mg/dL — CL (ref 70–99)
Glucose-Capillary: 73 mg/dL (ref 70–99)
Glucose-Capillary: 75 mg/dL (ref 70–99)
Glucose-Capillary: 78 mg/dL (ref 70–99)
Glucose-Capillary: 78 mg/dL (ref 70–99)
Glucose-Capillary: 97 mg/dL (ref 70–99)

## 2021-09-21 LAB — CBC WITH DIFFERENTIAL/PLATELET
Abs Immature Granulocytes: 0.14 10*3/uL — ABNORMAL HIGH (ref 0.00–0.07)
Basophils Absolute: 0.1 10*3/uL (ref 0.0–0.1)
Basophils Relative: 0 %
Eosinophils Absolute: 0.3 10*3/uL (ref 0.0–0.5)
Eosinophils Relative: 2 %
HCT: 22.8 % — ABNORMAL LOW (ref 39.0–52.0)
Hemoglobin: 8.3 g/dL — ABNORMAL LOW (ref 13.0–17.0)
Immature Granulocytes: 1 %
Lymphocytes Relative: 3 %
Lymphs Abs: 0.5 10*3/uL — ABNORMAL LOW (ref 0.7–4.0)
MCH: 30.3 pg (ref 26.0–34.0)
MCHC: 36.4 g/dL — ABNORMAL HIGH (ref 30.0–36.0)
MCV: 83.2 fL (ref 80.0–100.0)
Monocytes Absolute: 1 10*3/uL (ref 0.1–1.0)
Monocytes Relative: 6 %
Neutro Abs: 14.6 10*3/uL — ABNORMAL HIGH (ref 1.7–7.7)
Neutrophils Relative %: 88 %
Platelets: 185 10*3/uL (ref 150–400)
RBC: 2.74 MIL/uL — ABNORMAL LOW (ref 4.22–5.81)
RDW: 16.6 % — ABNORMAL HIGH (ref 11.5–15.5)
WBC: 16.6 10*3/uL — ABNORMAL HIGH (ref 4.0–10.5)
nRBC: 0 % (ref 0.0–0.2)

## 2021-09-21 LAB — BASIC METABOLIC PANEL
Anion gap: 12 (ref 5–15)
BUN: 45 mg/dL — ABNORMAL HIGH (ref 8–23)
CO2: 23 mmol/L (ref 22–32)
Calcium: 8.3 mg/dL — ABNORMAL LOW (ref 8.9–10.3)
Chloride: 96 mmol/L — ABNORMAL LOW (ref 98–111)
Creatinine, Ser: 3.33 mg/dL — ABNORMAL HIGH (ref 0.61–1.24)
GFR, Estimated: 19 mL/min — ABNORMAL LOW (ref >60)
Glucose, Bld: 67 mg/dL — ABNORMAL LOW (ref 70–99)
Potassium: 4.1 mmol/L (ref 3.5–5.1)
Sodium: 131 mmol/L — ABNORMAL LOW (ref 135–145)

## 2021-09-21 LAB — POCT I-STAT 7, (LYTES, BLD GAS, ICA,H+H)
Acid-Base Excess: 0 mmol/L (ref 0.0–2.0)
Bicarbonate: 23.6 mmol/L (ref 20.0–28.0)
Calcium, Ion: 1.15 mmol/L (ref 1.15–1.40)
HCT: 23 % — ABNORMAL LOW (ref 39.0–52.0)
Hemoglobin: 7.8 g/dL — ABNORMAL LOW (ref 13.0–17.0)
O2 Saturation: 98 %
Patient temperature: 36.7
Potassium: 4 mmol/L (ref 3.5–5.1)
Sodium: 133 mmol/L — ABNORMAL LOW (ref 135–145)
TCO2: 25 mmol/L (ref 22–32)
pCO2 arterial: 31.2 mmHg — ABNORMAL LOW (ref 32.0–48.0)
pH, Arterial: 7.486 — ABNORMAL HIGH (ref 7.350–7.450)
pO2, Arterial: 96 mmHg (ref 83.0–108.0)

## 2021-09-21 LAB — LACTATE DEHYDROGENASE: LDH: 379 U/L — ABNORMAL HIGH (ref 98–192)

## 2021-09-21 LAB — HEPARIN LEVEL (UNFRACTIONATED): Heparin Unfractionated: <0.1 IU/mL — ABNORMAL LOW (ref 0.30–0.70)

## 2021-09-21 LAB — COOXEMETRY PANEL
Carboxyhemoglobin: 1.2 % (ref 0.5–1.5)
Methemoglobin: 1 % (ref 0.0–1.5)
O2 Saturation: 62.8 %
Total hemoglobin: 8 g/dL — ABNORMAL LOW (ref 12.0–16.0)

## 2021-09-21 LAB — MAGNESIUM: Magnesium: 2.3 mg/dL (ref 1.7–2.4)

## 2021-09-21 LAB — PHOSPHORUS: Phosphorus: 4.6 mg/dL (ref 2.5–4.6)

## 2021-09-21 LAB — PROTIME-INR
INR: 1.4 — ABNORMAL HIGH (ref 0.8–1.2)
Prothrombin Time: 17.3 seconds — ABNORMAL HIGH (ref 11.4–15.2)

## 2021-09-21 MED ORDER — LIP MEDEX EX OINT
TOPICAL_OINTMENT | CUTANEOUS | Status: DC | PRN
  Filled 2021-09-21: qty 7

## 2021-09-21 MED ORDER — DEXTROSE 50 % IV SOLN
INTRAVENOUS | Status: AC
  Administered 2021-09-21: 25 mL
  Filled 2021-09-21: qty 50

## 2021-09-21 MED ORDER — FUROSEMIDE 10 MG/ML IJ SOLN
80.0000 mg | Freq: Two times a day (BID) | INTRAMUSCULAR | Status: AC
  Administered 2021-09-21 (×2): 80 mg via INTRAVENOUS
  Filled 2021-09-21 (×2): qty 8

## 2021-09-21 MED ORDER — POLYETHYLENE GLYCOL 3350 17 G PO PACK
17.0000 g | PACK | Freq: Every day | ORAL | Status: DC | PRN
  Administered 2021-09-26: 17 g via ORAL
  Filled 2021-09-21: qty 1

## 2021-09-21 MED ORDER — POTASSIUM CHLORIDE 10 MEQ/50ML IV SOLN
10.0000 meq | INTRAVENOUS | Status: AC
  Administered 2021-09-21 (×2): 10 meq via INTRAVENOUS
  Filled 2021-09-21: qty 50

## 2021-09-21 MED ORDER — WARFARIN SODIUM 2.5 MG PO TABS
2.5000 mg | ORAL_TABLET | Freq: Once | ORAL | Status: AC
  Administered 2021-09-21: 2.5 mg via ORAL
  Filled 2021-09-21: qty 1

## 2021-09-21 NOTE — Progress Notes (Signed)
Patient ID: Isaac [name], male   DOB: July 14, 1951, 71 y.o.   MRN: [id number]   Advanced Heart Failure VAD Team Note  PCP-Cardiologist: None   Subjective:    12/19 RHC- RA 7, PA 39/14 (25), PCWP 11, CO 6.4 CI 3. Thermo 3.5 1.8. Lasix drip stopped after cath. 12/20 Swan removed.  Norepi 3 mcg added.  12/21 Fever. Blood CX drawn. UA + leukocytes. Started on vanc and cefepime. Diuresed with IV lasix + metolazone.  12/23 Impella 5.5 placed 12/28 HM III LVAD, ICD leads plastered to tricuspid valve with severe TR, valve not replaced and some improvement in TR with LVAD placement.  12/29 Extubated  On milrinone 0.25, NE 19, epinephrine 6, amiodarone gtt 30.  MAP 78-80 (doppler).  Co-ox 63%.   Creatinine higher at 2.39 => 2.61 => 3.1 => 3.33. I/Os net negative 526 with IV Lasix boluses.  CVP lower around 14 today.   Afebrile, WBCs 17.7 => 16.6.    Finally able to sleep last night. Remains on Precedex for delirium/agitation.  Per nurse, his delirium has improved though not back to normal.  He is currently sleeping.  He has been eating liquids, no BM.    LVAD INTERROGATION:  HeartMate III LVAD:   Flow 4.9 liters/min, speed 5400, power 3.9, PI 3.1.   LDH 344 => 379 => 376 => 379 On heparin gtt  Plts 113 => 121 => 185 Hgb 7.9 => 8.3  Objective:    Vital Signs:   Temp:  [97.6 F (36.4 C)-98.1 F (36.7 C)] 98.1 F (36.7 C) (01/01 0400) Pulse Rate:  [85-182] 104 (01/01 0700) Resp:  [5-28] 15 (01/01 0700) SpO2:  [73 %-100 %] 97 % (01/01 0700) Weight:  [85.7 kg] 85.7 kg (01/01 0500) Last BM Date: 09/10/21 Mean arterial Pressure  78-80 (doppler)  Intake/Output:   Intake/Output Summary (Last 24 hours) at 09/21/2021 0734 Last data filed at 09/21/2021 0700 Gross per 24 hour  Intake 1634.18 ml  Output 2160 ml  Net -525.82 ml     Physical Exam    General: NAD, sleeping.  HEENT: Normal. Neck: Supple, JVP 14 cm. Carotids OK.  Cardiac:  Mechanical heart sounds with LVAD hum present.   Lungs:  CTAB, normal effort.  Abdomen:  NT, ND, no HSM. No bruits or masses. +BS  LVAD exit site: Well-healed and incorporated. Dressing dry and intact. No erythema or drainage. Stabilization device present and accurately applied. Driveline dressing changed daily per sterile technique. Extremities:  Warm and dry. No cyanosis, clubbing, rash, or edema.  Neuro:  Sleeping, has been delirious but improved with Precedex.     Telemetry   A-V paced (personally reviewed)   Labs   Basic Metabolic Panel: Recent Labs  Lab 09/18/21 0407 09/18/21 0451 09/18/21 1505 09/18/21 1804 09/19/21 0358 09/19/21 1524 09/19/21 1708 09/19/21 2146 09/20/21 0211 09/20/21 0251 09/20/21 1946 09/21/21 0344 09/21/21 0356  NA 130*   < > 133*   < > 132*   < > 132*   < > 134* 127* 133* 131* 133*  K 4.2   < > 4.8   < > 4.3   < > 4.2   < > 3.9 3.7 4.3 4.1 4.0  CL 98  --  99  --  101  --  98  --   --  93* 99 96*  --   CO2 21*  --  23  --  23  --  24  --   --  23 25  23  --   GLUCOSE 118*  --  120*  --  127*  --  57*  --   --  357* 77 67*  --   BUN 31*  --  34*  --  36*  --  38*  --   --  38* 40* 45*  --   CREATININE 2.11*  --  2.20*  --  2.39*  --  2.51*  --   --  2.61* 3.13* 3.33*  --   CALCIUM 8.0*  --  7.9*  --  7.9*  --  8.1*  --   --  7.9* 8.1* 8.3*  --   MG 2.2  --  2.3  --  2.2  --   --   --   --  2.2  --  2.3  --   PHOS 3.3  --   --   --  4.6  --   --   --   --  4.1  --  4.6  --    < > = values in this interval not displayed.    Liver Function Tests: Recent Labs  Lab 09/18/21 0407 09/19/21 0358 09/20/21 0251  AST 122* 113* 86*  ALT 26 9 5   ALKPHOS 49 67 74  BILITOT 3.1* 4.3* 4.7*  PROT 4.7* 4.5* 4.8*  ALBUMIN 2.6* 2.3* 2.3*   No results for input(s): LIPASE, AMYLASE in the last 168 hours. Recent Labs  Lab 09/20/21 0701  AMMONIA <10    CBC: Recent Labs  Lab 09/18/21 0407 09/18/21 0451 09/19/21 0358 09/19/21 1042 09/19/21 1524 09/20/21 0211 09/20/21 0251 09/20/21 1709  09/21/21 0344 09/21/21 0356  WBC 14.1*   < > 13.6* 16.3*  --   --  16.7* 17.7* 16.6*  --   NEUTROABS 9.7*  --  11.4*  --   --   --  14.4*  --  14.6*  --   HGB 6.5*   < > 7.5* 8.7*   < > 7.8* 7.9* 8.0* 8.3* 7.8*  HCT 18.8*   < > 21.2* 23.6*   < > 23.0* 21.5* 22.2* 22.8* 23.0*  MCV 89.5   < > 84.8 82.2  --   --  85.0 84.7 83.2  --   PLT 116*   < > 116* 113*  --   --  121* 152 185  --    < > = values in this interval not displayed.    INR: Recent Labs  Lab 09/17/21 1555 09/18/21 0407 09/19/21 0358 09/20/21 0251 09/21/21 0344  INR 1.7*   1.8* 1.6* 1.8* 1.5* 1.4*    Other results: EKG:    Imaging   DG CHEST PORT 1 VIEW  Result Date: 09/20/2021 CLINICAL DATA:  71 year old male with history of shortness of breath. EXAM: PORTABLE CHEST 1 VIEW COMPARISON:  Chest x-ray 09/19/2021. FINDINGS: Left internal jugular Cordis with tip terminating in the distal left internal jugular vein. Left-sided chest tube with tip projecting over the upper left hemithorax. Right-sided chest tube with tip projecting over the mid right hemithorax. There is a right upper extremity PICC with tip terminating in the superior cavoatrial junction. Left ventricular assist device partially imaged projecting over the cardiac apex. Left-sided pacemaker/AICD with lead tips projecting over the expected location of the right atrium and right ventricular apex. Status post median sternotomy. Bibasilar opacities which may reflect areas of atelectasis and/or consolidation. No definite pleural effusions. No definite pneumothorax. There is cephalization of the pulmonary vasculature and slight indistinctness  of the interstitial markings suggestive of mild pulmonary edema. Mild cardiomegaly. Upper mediastinal contours are within normal limits. Atherosclerotic calcifications are noted in the thoracic aorta. IMPRESSION: 1. Postoperative changes and support apparatus, as above. 2. The appearance the chest suggests probable congestive heart  failure, as above. 3. Aortic atherosclerosis. Electronically Signed   By: Vinnie Langton M.D.   On: 09/20/2021 09:56   [place] EKG SITE RITE  Result Date: 09/19/2021 If Site Rite image not attached, placement could not be confirmed due to current cardiac rhythm.    Medications:     Scheduled Medications:  sodium chloride   Intravenous Once   sodium chloride   Intravenous Once   acetaminophen  1,000 mg Oral Q6H   Or   acetaminophen (TYLENOL) oral liquid 160 mg/5 mL  1,000 mg Per Tube Q6H   aspirin  324 mg Oral Daily   bisacodyl  10 mg Oral Daily   Or   bisacodyl  10 mg Rectal Daily   chlorhexidine gluconate (MEDLINE KIT)  15 mL Mouth Rinse BID   Chlorhexidine Gluconate Cloth  6 each Topical Daily   docusate sodium  200 mg Oral Daily   feeding supplement  237 mL Oral TID BM   furosemide  80 mg Intravenous BID   insulin aspart  0-15 Units Subcutaneous Q4H   mouth rinse  15 mL Mouth Rinse BID   montelukast  5 mg Oral QHS   multivitamin with minerals  1 tablet Oral Daily   pantoprazole  40 mg Oral Daily   rosuvastatin  10 mg Oral Daily   sodium chloride flush  10-40 mL Intracatheter Q12H   sodium chloride flush  10-40 mL Intracatheter Q12H   sodium chloride flush  3 mL Intravenous Q12H   warfarin  2.5 mg Oral ONCE-1600   Warfarin - Pharmacist Dosing Inpatient   Does not apply q1600    Infusions:  sodium chloride Stopped (09/20/21 0304)   sodium chloride Stopped (09/18/21 0803)   sodium chloride 20 mL/hr at 09/17/21 1530   amiodarone 30 mg/hr (09/21/21 0600)   dexmedetomidine (PRECEDEX) IV infusion 0.2 mcg/kg/hr (09/21/21 0600)   epinephrine 6 mcg/min (09/21/21 0600)   heparin 500 Units/hr (09/21/21 0600)   lactated ringers     lactated ringers 20 mL/hr at 09/20/21 2027   milrinone 0.25 mcg/kg/min (09/21/21 0600)   norepinephrine (LEVOPHED) Adult infusion 20 mcg/min (09/21/21 0600)   potassium chloride      PRN Medications: sodium chloride, albuterol, melatonin, morphine  injection, ondansetron (ZOFRAN) IV, oxyCODONE, sodium chloride flush, sodium chloride flush, sodium chloride flush, traMADol   Assessment/Plan:    1. Acute on chronic systolic CHF:  Long-standing cardiomyopathy.  [place].  Echo this admission with EF < 20%, severe LV dilation, restrictive diastolic function, moderate RV dysfunction, moderate MR, mod-severe TR. Cause of cardiomyopathy is uncertain.  He has a history of CAD, but I do not think that the described CAD from the past could explain his cardiomyopathy, but CAD could have progressed.  With difficulty tolerating GDMT/need for midodrine and cardiorenal syndrome as well as profound volume overload + NYHA class IV symptoms,  concerned for low output HF. Co-ox off milrinone was 36%, milrinone started and increased to 0.375 but CO remained low. NE added and Impella 5.5 placed 12/23. EF 10% on TEE 12/23. s/p HM III VAD on 12/28.  Currently on NE 19, epinephrine 6, milrinone 0.25.  Co-ox low at 63%.  LVAD parameters stable. Creatinine trending higher 3.33.  CVP  elevated at 14 but lower than yesterday, net negative I/Os. MAP stable by doppler. LDH stable.  - Wean NE as BP allows. Slowly/carefully to maintain renal perfusion with AKI on CKD.  - Continue milrinone, epinephrine.   - Lasix 80 mg IV bid again today.  - Continue heparin gtt for LVAD, will start warfarin if he will take po today.   - Hopefully chest tubes out today.  2. Tricuspid regurgitation: Has moderate-severe TR.  Tricuspid repair not done at time of VAD d/t proximity of ICD wires 3. Atrial fibrillation: Persistent since 10/22 based on device interrogation.  Currently A-V paced.  - Continue amiodarone gtt (will not take pills, confused).  4. AKI on CKD stage 3: Creatinine 2.61 => 3.1 => 3.33.  Gradual rise, hopefully this will plateau soon.   - Maintain MAP and CO.    - With elevated CVP, gently diuresing.  5. CAD: History of PCI to OM1 in 2007 and RCA in 2013.  No chest  pain or ACS.  - Deferred cardiac cath due to AKI and plan for VAD - Continue Crestor.  6. ID: Had fever 12/21, PCT 1.03.  Has been afebrile since.  Blood cultures negative. ?Phlebitis at IV site. Completed course of abx. WBCs elevated to  17.7 => 16.6 post-op but no fever or evidence for infection. CXR yesterday with pulmonary edema but not definite PNA.  - CXR today.  7. L Pleural Effusion: Noted on CT.  8. Hyponatremia: Hypervolemic hyponatremia.  Na 131 today.    9. Anemia: Blood loss anemia, had hematoma at Impella 5.5 site with significant drainage after removal. Hgb 8.3 today.  -  transfuse hgb < 7.5 (would like to avoid extra volume if possible).  10. Confusion/delirium: Started on Precedex 12/31, slept through night last night and remains asleep.  Less agitated on Precedex, hopefully will start to have some clearing.  - Continue Precedex gtt.  - Encourage family at bedside.  11. F/E/N: Full liquids, will try to get him to drink Ensure today.  Has been hypoglycemic but this is improved.   Will try to get him up with PT today.   I reviewed the LVAD parameters from today, and compared the results to the patient's prior recorded data.  No programming changes were made.  The LVAD is functioning within specified parameters.  The patient performs LVAD self-test daily.  LVAD interrogation was negative for any significant power changes, alarms or PI events/speed drops.  LVAD equipment check completed and is in good working order.  Back-up equipment present.   LVAD education done on emergency procedures and precautions and reviewed exit site care.  CRITICAL CARE Performed by: Loralie Champagne  Total critical care time: 40 minutes  Critical care time was exclusive of separately billable procedures and treating other patients.  Critical care was necessary to treat or prevent imminent or life-threatening deterioration.  Critical care was time spent personally by me on the following activities:  development of treatment plan with patient and/or surrogate as well as nursing, discussions with consultants, evaluation of patient's response to treatment, examination of patient, obtaining history from patient or surrogate, ordering and performing treatments and interventions, ordering and review of laboratory studies, ordering and review of radiographic studies, pulse oximetry and re-evaluation of patient's condition.  Loralie Champagne 09/21/2021 7:34 AM

## 2021-09-21 NOTE — Progress Notes (Signed)
ANTICOAGULATION CONSULT NOTE   Pharmacy Consult for Warfarin + heparin Indication: LVAD/AF  Allergies  Allergen Reactions   Mushroom Extract Complex Nausea And Vomiting   Neosporin [Neomycin-Bacitracin Zn-Polymyx] Hives    Patient Measurements: Height: 5\' 9"  (175.3 cm) Weight: 85.7 kg (188 lb 15 oz) IBW/kg (Calculated) : 70.7 Heparin Dosing Weight: 83.7 kg  Vital Signs: Temp: 99.5 F (37.5 C) (01/01 1154) Temp Source: Oral (01/01 1154) Pulse Rate: 87 (01/01 1100)  Labs: Recent Labs    09/19/21 0358 09/19/21 1042 09/20/21 0251 09/20/21 1709 09/20/21 1946 09/21/21 0344 09/21/21 0356  HGB 7.5*   < > 7.9* 8.0*  --  8.3* 7.8*  HCT 21.2*   < > 21.5* 22.2*  --  22.8* 23.0*  PLT 116*   < > 121* 152  --  185  --   LABPROT 20.6*  --  17.8*  --   --  17.3*  --   INR 1.8*  --  1.5*  --   --  1.4*  --   HEPARINUNFRC  --   --   --   --   --  <0.10*  --   CREATININE 2.39*   < > 2.61*  --  3.13* 3.33*  --    < > = values in this interval not displayed.     Estimated Creatinine Clearance: 22.7 mL/min (A) (by C-G formula based on SCr of 3.33 mg/dL (H)).   Medical History: Past Medical History:  Diagnosis Date   Arrhythmia    atrial fibrillation   CHF (congestive heart failure) (HCC)    Chronic kidney disease    Coronary artery disease    Hyperlipidemia    Hypertension    Myocardial infarct [facility])     Assessment: 71 yo male on chronic Eliquis PTA for afib.  Now s/p LVAD placement 09/17/21, pharmacy asked to begin anticoagulation with Coumadin today.  Previously this admission had hematoma at Impella site and has required multiple blood products.  Hemoglobin has remained low, but appears pretty stable.  12/31 patient is too delirious for initial dose of warfarin. Discussed with cardiology and will start low dose heparin at 500 units/hr with no plans for titration.   1/1 > mental status improving, able to take PO.  INR 1.4 today.  Heparin level this AM undetectable as  expected, CBC fairly stable.  Goal of Therapy:  Heparin level <0.3 INR 2-2.5 Monitor platelets by anticoagulation protocol: Yes   Plan:  Continue IV Heparin at 500 units/hr- no titration planned Coumadin 2.5 mg po x 1 - ok to give now while pt awake/alert. Daily heparin level with cbc and INR Monitor for S/S of bleeding.  [place] Crane, Roylene Reason, BCCP Clinical Pharmacist  09/21/2021 1:01 PM   [facility] pharmacy phone numbers are listed on [email or web address]

## 2021-09-21 NOTE — Progress Notes (Signed)
4 Days Post-Op Procedure(s) (LRB): INSERTION OF IMPLANTABLE LEFT VENTRICULAR ASSIST DEVICE AND INSERTION OF FEMORAL ARTERIAL LINE (N/A) TRANSESOPHAGEAL ECHOCARDIOGRAM (TEE) (N/A) APPLICATION OF CELL SAVER (N/A) Subjective: Less agitated, more cooperative today  Objective: Vital signs in last 24 hours: Temp:  [97.7 F (36.5 C)-98.7 F (37.1 C)] 98.7 F (37.1 C) (01/01 0736) Pulse Rate:  [85-182] 104 (01/01 0700) Cardiac Rhythm: A-V Sequential paced (01/01 0400) Resp:  [5-28] 15 (01/01 0700) SpO2:  [73 %-100 %] 97 % (01/01 0700) Weight:  [85.7 kg] 85.7 kg (01/01 0500)  Hemodynamic parameters for last 24 hours: CVP:  [10 mmHg-57 mmHg] 15 mmHg  Intake/Output from previous day: 12/31 0701 - 01/01 0700 In: 1634.2 [P.O.:90; I.V.:1311; IV Piggyback:233.1] Out: 2160 [Urine:2070; Drains:10; Chest Tube:80] Intake/Output this shift: No intake/output data recorded.  General appearance: cooperative and no distress Neurologic: less agitated, no focal motor deficit Heart: regular rate and rhythm Lungs: diminished breath sounds bibasilar Wound: minimal erythema right shoulder   LVADLVAD INTERROGATION:  HeartMate III LVAD:   Flow 4.9 liters/min, speed 5400, power 3.9, PI 3.1.   LDH 344 => 379 => 376 => 379 On heparin gtt   Plts 113 => 121 => 185 Hgb 7.9 => 8.3   Lab Results: Recent Labs    09/20/21 1709 09/21/21 0344 09/21/21 0356  WBC 17.7* 16.6*  --   HGB 8.0* 8.3* 7.8*  HCT 22.2* 22.8* 23.0*  PLT 152 185  --    BMET:  Recent Labs    09/20/21 1946 09/21/21 0344 09/21/21 0356  NA 133* 131* 133*  K 4.3 4.1 4.0  CL 99 96*  --   CO2 25 23  --   GLUCOSE 77 67*  --   BUN 40* 45*  --   CREATININE 3.13* 3.33*  --   CALCIUM 8.1* 8.3*  --     PT/INR:  Recent Labs    09/21/21 0344  LABPROT 17.3*  INR 1.4*   ABG    Component Value Date/Time   PHART 7.486 (H) 09/21/2021 0356   HCO3 23.6 09/21/2021 0356   TCO2 25 09/21/2021 0356   ACIDBASEDEF 1.0 09/19/2021 2343    O2SAT 62.8 09/21/2021 0559   CBG (last 3)  Recent Labs    09/20/21 2342 09/21/21 0020 09/21/21 0400  GLUCAP 62* 97 73    Assessment/Plan: S/P Procedure(s) (LRB): INSERTION OF IMPLANTABLE LEFT VENTRICULAR ASSIST DEVICE AND INSERTION OF FEMORAL ARTERIAL LINE (N/A) TRANSESOPHAGEAL ECHOCARDIOGRAM (TEE) (N/A) APPLICATION OF CELL SAVER (N/A) - NEURO- delirium, agitation improved CV- co-ox 63, LVAD functioning well  Remains on milrinone, epi, norepi  CVP 15 RESP- diurese RENAL- creatinine up slightly to 3.3  Continue diuresis ENDO- CBG low last night, normal this AM GI- advance diet as mental status improves Anemia stable   LOS: 15 days    [place] Nakayama 09/21/2021

## 2021-09-21 NOTE — Progress Notes (Signed)
Drive Line: Existing VAD dressing removed and site care performed using sterile technique. Drive line exit site cleaned with Chlora prep applicators x 2, allowed to dry, and gauze dressing with silver strip applied. Scant amount of bloody drainage noted at site and on silver strip. No redness, tenderness, foul odor or rash noted. Both drive line anchors remain secure.   Daily dressing changes per Nurse Davonna Belling or [place] coordinator. Next dressing change due: 09/22/21.

## 2021-09-21 NOTE — Progress Notes (Signed)
Pt left arm noted to be more edematous with firm bruising than yesterday.  Brachial pulse palpable and verified with Doppler.  Dr Aundra Dubin aware of changes.  Dr Aundra Dubin also aware that pt had taken Warfarin 2.5 mg.  Orders received to hold Heparin x4 hrs and resume at 1715.

## 2021-09-21 NOTE — Plan of Care (Signed)
°  Problem: Clinical Measurements: Goal: Ability to maintain clinical measurements within normal limits will improve Outcome: Progressing Goal: Respiratory complications will improve Outcome: Progressing   Problem: Cardiac: Goal: Ability to achieve and maintain adequate cardiopulmonary perfusion will improve Outcome: Progressing   Problem: Nutrition: Goal: Adequate nutrition will be maintained Outcome: Not Progressing   Problem: Coping: Goal: Level of anxiety will decrease Outcome: Not Progressing   Problem: Elimination: Goal: Will not experience complications related to bowel motility Outcome: Not Progressing   Problem: Pain Managment: Goal: General experience of comfort will improve Outcome: Not Progressing   Problem: Safety: Goal: Ability to remain free from injury will improve Outcome: Not Progressing   Problem: Activity: Goal: Capacity to carry out activities will improve Outcome: Not Progressing

## 2021-09-21 NOTE — Evaluation (Signed)
Physical Therapy Evaluation Patient Details Name: Isaac [name] MRN: [id number] DOB: 04-Feb-1951 Today's Date: 09/21/2021  History of Present Illness  Pt is a 71 y.o. male admitted 09/07/21 with near-syncope, nausea, poor oral intake, generalized weakness. Workup for advanced heart failure and persistent inotropic with requirement, cardiogenic shock;. [place] 12/19. Impella placed on 12/23. LVAD placed 12/28. Pt with post op delirium and agitation.  PMH includes advanced CHF (LVEF <20%), AICD/PPM, PAF on Eliquis, CKD 3, HTN, HLD.  Clinical Impression  Pt now s/p LVAD with expected decline in mobility. Pt able to tolerate OOB to chair. Expect as long as medical status continues to improve that pt will make good progress with mobility and be able to return home with family. Will continue to work toward incr mobility and LVAD education.        Recommendations for follow up therapy are one component of a multi-disciplinary discharge planning process, led by the attending physician.  Recommendations may be updated based on patient status, additional functional criteria and insurance authorization.  Follow Up Recommendations Home health PT    Assistance Recommended at Discharge Frequent or constant Supervision/Assistance  Functional Status Assessment    Equipment Recommendations  Rollator (4 wheels)    Recommendations for Other Services       Precautions / Restrictions Precautions Precautions: Fall;Other (comment) Precaution Comments: LVAD      Mobility  Bed Mobility Overal bed mobility: Needs Assistance Bed Mobility: Supine to Sit     Supine to sit: Mod assist;+2 for safety/equipment;HOB elevated     General bed mobility comments: assist to move legs and elevate trunk into sitting    Transfers Overall transfer level: Needs assistance Equipment used: 1 person hand held assist Transfers: Sit to/from Stand;Bed to chair/wheelchair/BSC Sit to Stand: Mod assist;+2 safety/equipment    Step pivot transfers: Mod assist;+2 safety/equipment       General transfer comment: Assist to bring hips up and for balance. Pt used hands on knees. Pivotal steps for bed to chair.    Ambulation/Gait               General Gait Details: Did not attempt  Stairs            Wheelchair Mobility    Modified Rankin (Stroke Patients Only)       Balance Overall balance assessment: Needs assistance Sitting-balance support: No upper extremity supported Sitting balance-Leahy Scale: Fair     Standing balance support: Single extremity supported Standing balance-Leahy Scale: Poor Standing balance comment: UE support and min assist for static standing                             Pertinent Vitals/Pain Pain Assessment: Faces Faces Pain Scale: Hurts little more Pain Location: chest Pain Descriptors / Indicators: Grimacing;Guarding Pain Intervention(s): Monitored during session    Home Living                          Prior Function                       Hand Dominance        Extremity/Trunk Assessment                Communication      Cognition Arousal/Alertness: Awake/alert Behavior During Therapy: WFL for tasks assessed/performed Overall Cognitive Status: Impaired/Different from baseline Area of Impairment: Problem solving;Following commands  Following Commands: Follows one step commands consistently;Follows one step commands with increased time     Problem Solving: Slow processing;Decreased initiation;Requires verbal cues;Requires tactile cues          General Comments General comments (skin integrity, edema, etc.): VSS on 4L    Exercises     Assessment/Plan    PT Assessment    PT Problem List         PT Treatment Interventions      PT Goals (Current goals can be found in the Care Plan section)  Acute Rehab PT Goals Patient Stated Goal: home PT Goal Formulation: With  patient Time For Goal Achievement: 10/05/21 Potential to Achieve Goals: Good    Frequency Min 3X/week   Barriers to discharge        Co-evaluation               AM-PAC PT "6 Clicks" Mobility  Outcome Measure Help needed turning from your back to your side while in a flat bed without using bedrails?: A Lot Help needed moving from lying on your back to sitting on the side of a flat bed without using bedrails?: A Lot Help needed moving to and from a bed to a chair (including a wheelchair)?: A Lot Help needed standing up from a chair using your arms (e.g., wheelchair or bedside chair)?: A Lot Help needed to walk in hospital room?: Total Help needed climbing 3-5 steps with a railing? : Total 6 Click Score: 10    End of Session Equipment Utilized During Treatment: Oxygen Activity Tolerance: Patient tolerated treatment well Patient left: in chair;with call bell/phone within reach;with chair alarm set;with family/visitor present;with nursing/sitter in room Nurse Communication: Mobility status (Nurse assisted with transfer) PT Visit Diagnosis: Unsteadiness on feet (R26.81);Other abnormalities of gait and mobility (R26.89)    Time: 1610-9604 PT Time Calculation (min) (ACUTE ONLY): 20 min   Charges:   PT Evaluation $PT Re-evaluation: 1 Re-eval          [place] Pager [phone] Office [place] 09/21/2021, 12:55 PM

## 2021-09-22 DIAGNOSIS — Z95811 Presence of heart assist device: Secondary | ICD-10-CM | POA: Diagnosis not present

## 2021-09-22 DIAGNOSIS — I509 Heart failure, unspecified: Secondary | ICD-10-CM | POA: Diagnosis not present

## 2021-09-22 DIAGNOSIS — I5043 Acute on chronic combined systolic (congestive) and diastolic (congestive) heart failure: Secondary | ICD-10-CM | POA: Diagnosis not present

## 2021-09-22 DIAGNOSIS — R570 Cardiogenic shock: Secondary | ICD-10-CM | POA: Diagnosis not present

## 2021-09-22 LAB — BASIC METABOLIC PANEL
Anion gap: 11 (ref 5–15)
Anion gap: 13 (ref 5–15)
BUN: 50 mg/dL — ABNORMAL HIGH (ref 8–23)
BUN: 55 mg/dL — ABNORMAL HIGH (ref 8–23)
CO2: 22 mmol/L (ref 22–32)
CO2: 22 mmol/L (ref 22–32)
Calcium: 8 mg/dL — ABNORMAL LOW (ref 8.9–10.3)
Calcium: 8.3 mg/dL — ABNORMAL LOW (ref 8.9–10.3)
Chloride: 97 mmol/L — ABNORMAL LOW (ref 98–111)
Chloride: 96 mmol/L — ABNORMAL LOW (ref 98–111)
Creatinine, Ser: 4.27 mg/dL — ABNORMAL HIGH (ref 0.61–1.24)
Creatinine, Ser: 4.62 mg/dL — ABNORMAL HIGH (ref 0.61–1.24)
GFR, Estimated: 14 mL/min — ABNORMAL LOW (ref >60)
GFR, Estimated: 13 mL/min — ABNORMAL LOW (ref >60)
Glucose, Bld: 112 mg/dL — ABNORMAL HIGH (ref 70–99)
Glucose, Bld: 125 mg/dL — ABNORMAL HIGH (ref 70–99)
Potassium: 4.2 mmol/L (ref 3.5–5.1)
Potassium: 4 mmol/L (ref 3.5–5.1)
Sodium: 130 mmol/L — ABNORMAL LOW (ref 135–145)
Sodium: 131 mmol/L — ABNORMAL LOW (ref 135–145)

## 2021-09-22 LAB — CBC WITH DIFFERENTIAL/PLATELET
Abs Immature Granulocytes: 0.1 10*3/uL — ABNORMAL HIGH (ref 0.00–0.07)
Basophils Absolute: 0.1 10*3/uL (ref 0.0–0.1)
Basophils Relative: 0 %
Eosinophils Absolute: 0.2 10*3/uL (ref 0.0–0.5)
Eosinophils Relative: 1 %
HCT: 23.4 % — ABNORMAL LOW (ref 39.0–52.0)
Hemoglobin: 8.3 g/dL — ABNORMAL LOW (ref 13.0–17.0)
Immature Granulocytes: 1 %
Lymphocytes Relative: 4 %
Lymphs Abs: 0.6 10*3/uL — ABNORMAL LOW (ref 0.7–4.0)
MCH: 29.9 pg (ref 26.0–34.0)
MCHC: 35.5 g/dL (ref 30.0–36.0)
MCV: 84.2 fL (ref 80.0–100.0)
Monocytes Absolute: 1.3 10*3/uL — ABNORMAL HIGH (ref 0.1–1.0)
Monocytes Relative: 9 %
Neutro Abs: 12.4 10*3/uL — ABNORMAL HIGH (ref 1.7–7.7)
Neutrophils Relative %: 85 %
Platelets: 248 10*3/uL (ref 150–400)
RBC: 2.78 MIL/uL — ABNORMAL LOW (ref 4.22–5.81)
RDW: 16.9 % — ABNORMAL HIGH (ref 11.5–15.5)
WBC: 14.7 10*3/uL — ABNORMAL HIGH (ref 4.0–10.5)
nRBC: 0 % (ref 0.0–0.2)

## 2021-09-22 LAB — GLUCOSE, CAPILLARY
Glucose-Capillary: 107 mg/dL — ABNORMAL HIGH (ref 70–99)
Glucose-Capillary: 110 mg/dL — ABNORMAL HIGH (ref 70–99)
Glucose-Capillary: 128 mg/dL — ABNORMAL HIGH (ref 70–99)
Glucose-Capillary: 140 mg/dL — ABNORMAL HIGH (ref 70–99)
Glucose-Capillary: 145 mg/dL — ABNORMAL HIGH (ref 70–99)
Glucose-Capillary: 69 mg/dL — ABNORMAL LOW (ref 70–99)
Glucose-Capillary: 81 mg/dL (ref 70–99)
Glucose-Capillary: 85 mg/dL (ref 70–99)

## 2021-09-22 LAB — POCT I-STAT 7, (LYTES, BLD GAS, ICA,H+H)
Acid-base deficit: 1 mmol/L (ref 0.0–2.0)
Bicarbonate: 22 mmol/L (ref 20.0–28.0)
Calcium, Ion: 1.15 mmol/L (ref 1.15–1.40)
HCT: 24 % — ABNORMAL LOW (ref 39.0–52.0)
Hemoglobin: 8.2 g/dL — ABNORMAL LOW (ref 13.0–17.0)
O2 Saturation: 96 %
Patient temperature: 37.5
Potassium: 4.2 mmol/L (ref 3.5–5.1)
Sodium: 132 mmol/L — ABNORMAL LOW (ref 135–145)
TCO2: 23 mmol/L (ref 22–32)
pCO2 arterial: 29.2 mmHg — ABNORMAL LOW (ref 32.0–48.0)
pH, Arterial: 7.486 — ABNORMAL HIGH (ref 7.350–7.450)
pO2, Arterial: 78 mmHg — ABNORMAL LOW (ref 83.0–108.0)

## 2021-09-22 LAB — LACTATE DEHYDROGENASE: LDH: 356 U/L — ABNORMAL HIGH (ref 98–192)

## 2021-09-22 LAB — HEPARIN LEVEL (UNFRACTIONATED): Heparin Unfractionated: <0.1 IU/mL — ABNORMAL LOW (ref 0.30–0.70)

## 2021-09-22 LAB — PROTIME-INR
INR: 1.9 — ABNORMAL HIGH (ref 0.8–1.2)
Prothrombin Time: 21.9 seconds — ABNORMAL HIGH (ref 11.4–15.2)

## 2021-09-22 LAB — MAGNESIUM: Magnesium: 2.3 mg/dL (ref 1.7–2.4)

## 2021-09-22 LAB — PHOSPHORUS: Phosphorus: 4.5 mg/dL (ref 2.5–4.6)

## 2021-09-22 LAB — COOXEMETRY PANEL
Carboxyhemoglobin: 1.2 % (ref 0.5–1.5)
Methemoglobin: 0.9 % (ref 0.0–1.5)
O2 Saturation: 60.1 %
Total hemoglobin: 7.9 g/dL — ABNORMAL LOW (ref 12.0–16.0)

## 2021-09-22 MED ORDER — AMIODARONE HCL 200 MG PO TABS
200.0000 mg | ORAL_TABLET | Freq: Two times a day (BID) | ORAL | Status: DC
  Administered 2021-09-22 (×2): 200 mg via ORAL
  Filled 2021-09-22 (×2): qty 1

## 2021-09-22 MED ORDER — SORBITOL 70 % SOLN
30.0000 mL | Freq: Every day | Status: DC | PRN
  Administered 2021-09-22 – 2021-09-28 (×3): 30 mL via ORAL
  Filled 2021-09-22 (×2): qty 30

## 2021-09-22 MED ORDER — WARFARIN SODIUM 2 MG PO TABS
2.0000 mg | ORAL_TABLET | Freq: Once | ORAL | Status: AC
  Administered 2021-09-22: 2 mg via ORAL
  Filled 2021-09-22: qty 1

## 2021-09-22 MED ORDER — BISACODYL 10 MG RE SUPP
10.0000 mg | Freq: Once | RECTAL | Status: AC
  Administered 2021-09-22: 10 mg via RECTAL
  Filled 2021-09-22: qty 1

## 2021-09-22 NOTE — Progress Notes (Signed)
ANTICOAGULATION CONSULT NOTE   Pharmacy Consult for Warfarin + heparin Indication: LVAD/AF  Allergies  Allergen Reactions   Mushroom Extract Complex Nausea And Vomiting   Neosporin [Neomycin-Bacitracin Zn-Polymyx] Hives    Patient Measurements: Height: 5\' 9"  (175.3 cm) Weight: 86.3 kg (190 lb 4.1 oz) (with controller) IBW/kg (Calculated) : 70.7 Heparin Dosing Weight: 83.7 kg  Vital Signs: Temp: 99.3 F (37.4 C) (01/02 0630) Temp Source: Bladder (01/02 0000) BP: 112/80 (01/02 0800) Pulse Rate: 84 (01/02 0800)  Labs: Recent Labs    09/20/21 0251 09/20/21 1709 09/20/21 1946 09/21/21 0344 09/21/21 0356 09/22/21 0406 09/22/21 0431  HGB 7.9* 8.0*  --  8.3* 7.8* 8.3* 8.2*  HCT 21.5* 22.2*  --  22.8* 23.0* 23.4* 24.0*  PLT 121* 152  --  185  --  248  --   LABPROT 17.8*  --   --  17.3*  --  21.9*  --   INR 1.5*  --   --  1.4*  --  1.9*  --   HEPARINUNFRC  --   --   --  <0.10*  --  <0.10*  --   CREATININE 2.61*  --  3.13* 3.33*  --  4.27*  --      Estimated Creatinine Clearance: 17.8 mL/min (A) (by C-G formula based on SCr of 4.27 mg/dL (H)).   Medical History: Past Medical History:  Diagnosis Date   Arrhythmia    atrial fibrillation   CHF (congestive heart failure) (HCC)    Chronic kidney disease    Coronary artery disease    Hyperlipidemia    Hypertension    Myocardial infarct [facility])     Assessment: 71 yo male on chronic Eliquis PTA for afib.  Now s/p LVAD placement 09/17/21, pharmacy asked to begin anticoagulation with warfarin today. Low-dose heparin infusion started as well.  Heparin level undetectable as expected, INR is up to 1.9 s/p one dose of warfarin. CBC and LDH stable. Will be cautious with warfarin dosing given quick rise and also possible need for CRRT initiation this week.  Goal of Therapy:  Heparin level <0.3 INR 2-2.5 Monitor platelets by anticoagulation protocol: Yes   Plan:  -Continue IV Heparin at 500 units/hr- no titration  planned -Warfarin 2mg  PO x1 -Daily INR, heparin level, CBC, LDH  Arrie Senate, PharmD, [place], [facility] Clinical Pharmacist [phone] Please check AMION for all [facility] Pharmacy numbers 09/22/2021

## 2021-09-22 NOTE — Progress Notes (Signed)
Physical Therapy Treatment Patient Details Name: Isaac [name] MRN: [id number] DOB: 08-17-1951 Today's Date: 09/22/2021   History of Present Illness Pt is a 71 y.o. male admitted 09/07/21 with near-syncope, nausea, poor oral intake, generalized weakness. Workup for advanced heart failure and persistent inotropic with requirement, cardiogenic shock;. [place] 12/19. Impella placed on 12/23. LVAD placed 12/28. Pt with post op delirium and agitation.  PMH includes advanced CHF (LVEF <20%), AICD/PPM, PAF on Eliquis, CKD 3, HTN, HLD.    PT Comments    Pt making good progress with mobility and able to amb in hallway with assist. Pt observed change from power module to batteries to power module.    Recommendations for follow up therapy are one component of a multi-disciplinary discharge planning process, led by the attending physician.  Recommendations may be updated based on patient status, additional functional criteria and insurance authorization.  Follow Up Recommendations  Home health PT     Assistance Recommended at Discharge Frequent or constant Supervision/Assistance  Equipment Recommendations  Rollator (4 wheels)    Recommendations for Other Services       Precautions / Restrictions Precautions Precautions: Fall;Other (comment) Precaution Comments: LVAD Restrictions Weight Bearing Restrictions: No     Mobility  Bed Mobility Overal bed mobility: Needs Assistance Bed Mobility: Rolling;Sit to Sidelying Rolling: Supervision   Supine to sit: Mod assist;+2 for safety/equipment;HOB elevated   Sit to sidelying: Mod assist General bed mobility comments: Assist to bring legs back up into bed. Verbal cues for technique    Transfers Overall transfer level: Needs assistance Equipment used: 2 person hand held assist Transfers: Sit to/from Stand Sit to Stand: +2 physical assistance;Min assist     Step pivot transfers: Mod assist;+2 safety/equipment     General transfer  comment: cues for hands on knees, min assist to rise and steady    Ambulation/Gait Ambulation/Gait assistance: Min assist;+2 safety/equipment Gait Distance (Feet): 180 Feet Assistive device: Ethelene Hal Gait Pattern/deviations: Step-through pattern;Decreased stride length;Trunk flexed Gait velocity: decr Gait velocity interpretation: 1.31 - 2.62 ft/sec, indicative of limited community ambulator   General Gait Details: Assist for support and lines. Verbal cue to stand more erect and look up   Stairs             Wheelchair Mobility    Modified Rankin (Stroke Patients Only)       Balance Overall balance assessment: Needs assistance Sitting-balance support: No upper extremity supported Sitting balance-Leahy Scale: Fair     Standing balance support: Bilateral upper extremity supported Standing balance-Leahy Scale: Poor Standing balance comment: UE support and min guard for static standing                            Cognition Arousal/Alertness: Awake/alert Behavior During Therapy: Flat affect Overall Cognitive Status: Impaired/Different from baseline Area of Impairment: Problem solving;Following commands                       Following Commands: Follows one step commands with increased time     Problem Solving: Slow processing;Decreased initiation;Requires verbal cues;Requires tactile cues General Comments: improving cognition        Exercises      General Comments General comments (skin integrity, edema, etc.): VSS on 6L      Pertinent Vitals/Pain Pain Assessment: Faces Faces Pain Scale: Hurts little more Pain Location: chest Pain Descriptors / Indicators: Sore Pain Intervention(s): Monitored during session    Home Living  Family/patient expects to be discharged to:: Private residence Living Arrangements: Spouse/significant other Available Help at Discharge: Available 24 hours/day;Family Type of Home: House Home Access: Stairs to  enter   Entrance Stairs-Number of Steps: 1 Alternate Level Stairs-Number of Steps: flight Home Layout: Bed/bath upstairs;Two level Home Equipment: Cane - single point      Prior Function            PT Goals (current goals can now be found in the care plan section) Acute Rehab PT Goals Patient Stated Goal: home PT Goal Formulation: With patient Time For Goal Achievement: 10/05/21 Potential to Achieve Goals: Good Progress towards PT goals: Progressing toward goals    Frequency    Min 3X/week      PT Plan Current plan remains appropriate    Co-evaluation PT/OT/SLP Co-Evaluation/Treatment: Yes Reason for Co-Treatment: Complexity of the patient's impairments (multi-system involvement) PT goals addressed during session: Mobility/safety with mobility OT goals addressed during session: ADL's and self-care      AM-PAC PT "6 Clicks" Mobility   Outcome Measure  Help needed turning from your back to your side while in a flat bed without using bedrails?: A Little Help needed moving from lying on your back to sitting on the side of a flat bed without using bedrails?: A Lot Help needed moving to and from a bed to a chair (including a wheelchair)?: A Lot Help needed standing up from a chair using your arms (e.g., wheelchair or bedside chair)?: A Lot Help needed to walk in hospital room?: A Little Help needed climbing 3-5 steps with a railing? : Total 6 Click Score: 13    End of Session Equipment Utilized During Treatment: Oxygen Activity Tolerance: Patient tolerated treatment well Patient left: with call bell/phone within reach;with nursing/sitter in room;in bed Nurse Communication: Mobility status PT Visit Diagnosis: Unsteadiness on feet (R26.81);Other abnormalities of gait and mobility (R26.89)     Time: 9292-4462 PT Time Calculation (min) (ACUTE ONLY): 31 min  Charges:  $Gait Training: 8-22 mins                     [place] Pager [phone] Office [place] 09/22/2021, 11:51 AM

## 2021-09-22 NOTE — Progress Notes (Signed)
Patient ID: Isaac [name], male   DOB: June 02, 1951, 71 y.o.   MRN: [id number]    Advanced Heart Failure VAD Team Note  PCP-Cardiologist: None   Subjective:    12/19 RHC- RA 7, PA 39/14 (25), PCWP 11, CO 6.4 CI 3. Thermo 3.5 1.8. Lasix drip stopped after cath. 12/20 Swan removed.  Norepi 3 mcg added.  12/21 Fever. Blood CX drawn. UA + leukocytes. Started on vanc and cefepime. Diuresed with IV lasix + metolazone.  12/23 Impella 5.5 placed 12/28 HM III LVAD, ICD leads plastered to tricuspid valve with severe TR, valve not replaced and some improvement in TR with LVAD placement.  12/29 Extubated  On milrinone 0.25, NE 16, epinephrine 6, amiodarone gtt 30.  MAP 80s.  Co-ox 60%.   Creatinine higher at 2.39 => 2.61 => 3.1 => 3.33 => 4.27. UOP 985 cc.  CVP 11-12 but patient is edematous.  Afebrile (Tm 99.7), WBCs 17.7 => 16.6 => 14.7    Delirium resolved, awake/alert and up to chair.   LVAD INTERROGATION:  HeartMate III LVAD:   Flow 4.5 liters/min, speed 5400, power 3.9, PI 3.4.  No PI events   LDH 344 => 379 => 376 => 379 => 356 On low dose heparin gtt INR 1.9  Plts 113 => 121 => 185 => 248 Hgb 7.9 => 8.3 => 8.3  Objective:    Vital Signs:   Temp:  [99 F (37.2 C)-99.9 F (37.7 C)] 99.3 F (37.4 C) (01/02 0630) Pulse Rate:  [83-133] 133 (01/02 0700) Resp:  [13-37] 22 (01/02 0700) SpO2:  [91 %-99 %] 97 % (01/02 0700) Weight:  [86.3 kg] 86.3 kg (01/02 0500) Last BM Date: 09/10/21 Mean arterial Pressure  78-80 (doppler)  Intake/Output:   Intake/Output Summary (Last 24 hours) at 09/22/2021 0758 Last data filed at 09/22/2021 0700 Gross per 24 hour  Intake 2321.56 ml  Output 935 ml  Net 1386.56 ml     Physical Exam    General: Well appearing this am. NAD.  HEENT: Normal. Neck: Supple, JVP 10-12 cm. Carotids OK.  Cardiac:  Mechanical heart sounds with LVAD hum present.  Lungs:  CTAB, normal effort.  Abdomen:  NT, ND, no HSM. No bruits or masses. +BS  LVAD exit site:  Well-healed and incorporated. Dressing dry and intact. No erythema or drainage. Stabilization device present and accurately applied. Driveline dressing changed daily per sterile technique. Extremities:  Warm and dry. No cyanosis, clubbing, rash. 2+ edema to knees, edema of forearms.  Neuro:  Alert & oriented x 3. Cranial nerves grossly intact. Moves all 4 extremities w/o difficulty. Affect pleasant    Telemetry   A-V paced 90s => ?junctional 80s underlying (personally reviewed)   Labs   Basic Metabolic Panel: Recent Labs  Lab 09/18/21 0407 09/18/21 0451 09/18/21 1505 09/18/21 1804 09/19/21 0358 09/19/21 1524 09/19/21 1708 09/19/21 2146 09/20/21 0251 09/20/21 1946 09/21/21 0344 09/21/21 0356 09/22/21 0406 09/22/21 0431  NA 130*   < > 133*   < > 132*   < > 132*   < > 127* 133* 131* 133* 130* 132*  K 4.2   < > 4.8   < > 4.3   < > 4.2   < > 3.7 4.3 4.1 4.0 4.2 4.2  CL 98  --  99  --  101  --  98  --  93* 99 96*  --  97*  --   CO2 21*  --  23  --  23  --  24  --  23 25 23   --  22  --   GLUCOSE 118*  --  120*  --  127*  --  57*  --  357* 77 67*  --  112*  --   BUN 31*  --  34*  --  36*  --  38*  --  38* 40* 45*  --  50*  --   CREATININE 2.11*  --  2.20*  --  2.39*  --  2.51*  --  2.61* 3.13* 3.33*  --  4.27*  --   CALCIUM 8.0*  --  7.9*  --  7.9*  --  8.1*  --  7.9* 8.1* 8.3*  --  8.0*  --   MG 2.2  --  2.3  --  2.2  --   --   --  2.2  --  2.3  --  2.3  --   PHOS 3.3  --   --   --  4.6  --   --   --  4.1  --  4.6  --  4.5  --    < > = values in this interval not displayed.    Liver Function Tests: Recent Labs  Lab 09/18/21 0407 09/19/21 0358 09/20/21 0251  AST 122* 113* 86*  ALT 26 9 5   ALKPHOS 49 67 74  BILITOT 3.1* 4.3* 4.7*  PROT 4.7* 4.5* 4.8*  ALBUMIN 2.6* 2.3* 2.3*   No results for input(s): LIPASE, AMYLASE in the last 168 hours. Recent Labs  Lab 09/20/21 0701  AMMONIA <10    CBC: Recent Labs  Lab 09/18/21 0407 09/18/21 0451 09/19/21 0358  09/19/21 1042 09/19/21 1524 09/20/21 0251 09/20/21 1709 09/21/21 0344 09/21/21 0356 09/22/21 0406 09/22/21 0431  WBC 14.1*   < > 13.6* 16.3*  --  16.7* 17.7* 16.6*  --  14.7*  --   NEUTROABS 9.7*  --  11.4*  --   --  14.4*  --  14.6*  --  12.4*  --   HGB 6.5*   < > 7.5* 8.7*   < > 7.9* 8.0* 8.3* 7.8* 8.3* 8.2*  HCT 18.8*   < > 21.2* 23.6*   < > 21.5* 22.2* 22.8* 23.0* 23.4* 24.0*  MCV 89.5   < > 84.8 82.2  --  85.0 84.7 83.2  --  84.2  --   PLT 116*   < > 116* 113*  --  121* 152 185  --  248  --    < > = values in this interval not displayed.    INR: Recent Labs  Lab 09/18/21 0407 09/19/21 0358 09/20/21 0251 09/21/21 0344 09/22/21 0406  INR 1.6* 1.8* 1.5* 1.4* 1.9*    Other results: EKG:    Imaging   DG CHEST PORT 1 VIEW  Result Date: 09/21/2021 CLINICAL DATA:  Evaluate for pneumonia EXAM: PORTABLE CHEST 1 VIEW COMPARISON:  09/20/2021 FINDINGS: Right arm PICC line tip terminates over the distal SVC. There is a left IJ Cordis with tip terminating in the distal left internal jugular vein. Left chest wall ICD is noted with leads in the right atrial appendage and right ventricle. LVAD device partially imaged projecting over the left cardiac apex. Status post median sternotomy. Bilateral chest tubes are in place. No pneumothorax visualized. Small pleural effusion is identified on the right. Pulmonary vascular congestion appears unchanged. IMPRESSION: 1. Stable support apparatus. 2. Small right pleural effusion with pulmonary vascular congestion suggestive of mild CHF. Electronically  Signed   By: Kerby Moors M.D.   On: 09/21/2021 10:07     Medications:     Scheduled Medications:  sodium chloride   Intravenous Once   sodium chloride   Intravenous Once   acetaminophen  1,000 mg Oral Q6H   Or   acetaminophen (TYLENOL) oral liquid 160 mg/5 mL  1,000 mg Per Tube Q6H   aspirin  324 mg Oral Daily   bisacodyl  10 mg Oral Daily   Or   bisacodyl  10 mg Rectal Daily    chlorhexidine gluconate (MEDLINE KIT)  15 mL Mouth Rinse BID   Chlorhexidine Gluconate Cloth  6 each Topical Daily   docusate sodium  200 mg Oral Daily   feeding supplement  237 mL Oral TID BM   insulin aspart  0-15 Units Subcutaneous Q4H   mouth rinse  15 mL Mouth Rinse BID   montelukast  5 mg Oral QHS   multivitamin with minerals  1 tablet Oral Daily   pantoprazole  40 mg Oral Daily   rosuvastatin  10 mg Oral Daily   sodium chloride flush  10-40 mL Intracatheter Q12H   sodium chloride flush  10-40 mL Intracatheter Q12H   sodium chloride flush  3 mL Intravenous Q12H   Warfarin - Pharmacist Dosing Inpatient   Does not apply q1600    Infusions:  sodium chloride Stopped (09/20/21 0304)   sodium chloride Stopped (09/18/21 0803)   sodium chloride 20 mL/hr at 09/17/21 1530   amiodarone 30 mg/hr (09/22/21 0700)   dexmedetomidine (PRECEDEX) IV infusion Stopped (09/21/21 1345)   epinephrine 6 mcg/min (09/22/21 0700)   heparin 500 Units/hr (09/22/21 0700)   lactated ringers     lactated ringers 20 mL/hr at 09/20/21 2027   milrinone 0.25 mcg/kg/min (09/22/21 0700)   norepinephrine (LEVOPHED) Adult infusion 19 mcg/min (09/22/21 0700)    PRN Medications: sodium chloride, albuterol, lip balm, melatonin, morphine injection, ondansetron (ZOFRAN) IV, oxyCODONE, polyethylene glycol, sodium chloride flush, sodium chloride flush, sodium chloride flush, sorbitol, traMADol   Assessment/Plan:    1. Acute on chronic systolic CHF:  Long-standing cardiomyopathy.  [place].  Echo this admission with EF < 20%, severe LV dilation, restrictive diastolic function, moderate RV dysfunction, moderate MR, mod-severe TR. Cause of cardiomyopathy is uncertain.  He has a history of CAD, but I do not think that the described CAD from the past could explain his cardiomyopathy, but CAD could have progressed.  With difficulty tolerating GDMT/need for midodrine and cardiorenal syndrome as well as profound  volume overload + NYHA class IV symptoms,  concerned for low output HF. Co-ox off milrinone was 36%, milrinone started and increased to 0.375 but CO remained low. NE added and Impella 5.5 placed 12/23. EF 10% on TEE 12/23. s/p HM III VAD on 12/28.  Currently on NE 16, epinephrine 6, milrinone 0.25.  Co-ox 60%.  LVAD parameters stable. Creatinine trending higher 3.33 => 4.27.  CVP elevated at 11-12 and edmatous, only 985 cc UOP. MAP stable in lower 80s. LDH stable.  - OK to wean NE today with stable MAP. Slowly/carefully to maintain renal perfusion with AKI on CKD.  - Continue milrinone, epinephrine.   - Pulsatile waveform, increased speed today to 5500.  - Hold Lasix today with ongoing rise in creatinine despite stable UOP.   - Continue low dose heparin gtt + warfarin, INR 1.9 today.  - Can remove left IJ and JP drain.  - Place Unna boots to lower legs.  2. Tricuspid regurgitation: Has moderate-severe TR.  Tricuspid repair not done at time of VAD d/t proximity of ICD wires 3. Atrial fibrillation: Persistent since 10/22 based on device interrogation.  Currently A-V paced.  - I stopped his pacing, underlying rhythm looks junctional in 80s, will get ECG.  - Transition amiodarone to po, 200 bid.  4. AKI on CKD stage 3: Creatinine 2.61 => 3.1 => 3.33 => 4.27.  Gradual rise (was about 1.7 pre-op), hopefully this will plateau soon.   - Maintain MAP and CO.    - Will hold off on diuresis today though volume overloaded and ultimately needs more fluid off.  - Will ask renal to consult.  Unfortunately, will need CVVH unless renal function stabilizes off.  5. CAD: History of PCI to OM1 in 2007 and RCA in 2013.  No chest pain or ACS.  - Deferred cardiac cath due to AKI and plan for VAD - Continue Crestor.  6. ID: Had fever 12/21, PCT 1.03.  Has been afebrile since.  Blood cultures negative. ?Phlebitis at IV site. Completed course of abx. WBCs elevated to  17.7 => 16.6 => 14.7 post-op but no fever or evidence  for infection. CXR yesterday with pulmonary vascular congestion but no PNA.   7. Constipation: Add sorbitol today. Has colace and Dulcolax.  8. Hyponatremia: Hypervolemic hyponatremia.  Na 130 today.    9. Anemia: Blood loss anemia, had hematoma at Impella 5.5 site with significant drainage after removal. Hgb 8.3 today.  -  transfuse hgb < 7.5 (would like to avoid extra volume if possible).  10. Confusion/delirium: Initially post-op and started on Precedex.  This has cleared, now up to chair and eating.  - Encourage family at bedside.  11. F/E/N: Advance diet.   Up to chair, needs to walk in hall.   I reviewed the LVAD parameters from today, and compared the results to the patient's prior recorded data.  No programming changes were made.  The LVAD is functioning within specified parameters.  The patient performs LVAD self-test daily.  LVAD interrogation was negative for any significant power changes, alarms or PI events/speed drops.  LVAD equipment check completed and is in good working order.  Back-up equipment present.   LVAD education done on emergency procedures and precautions and reviewed exit site care.  CRITICAL CARE Performed by: Loralie Champagne  Total critical care time: 40 minutes  Critical care time was exclusive of separately billable procedures and treating other patients.  Critical care was necessary to treat or prevent imminent or life-threatening deterioration.  Critical care was time spent personally by me on the following activities: development of treatment plan with patient and/or surrogate as well as nursing, discussions with consultants, evaluation of patient's response to treatment, examination of patient, obtaining history from patient or surrogate, ordering and performing treatments and interventions, ordering and review of laboratory studies, ordering and review of radiographic studies, pulse oximetry and re-evaluation of patient's condition.  Loralie Champagne 09/22/2021 7:58 AM

## 2021-09-22 NOTE — Progress Notes (Signed)
5 Days Post-Op Procedure(s) (LRB): INSERTION OF IMPLANTABLE LEFT VENTRICULAR ASSIST DEVICE AND INSERTION OF FEMORAL ARTERIAL LINE (N/A) TRANSESOPHAGEAL ECHOCARDIOGRAM (TEE) (N/A) APPLICATION OF CELL SAVER (N/A) Subjective: Alert, appropriate, feels well  Objective: Vital signs in last 24 hours: Temp:  [99 F (37.2 C)-99.9 F (37.7 C)] 99.3 F (37.4 C) (01/02 0630) Pulse Rate:  [83-133] 133 (01/02 0700) Cardiac Rhythm: A-V Sequential paced (01/02 0400) Resp:  [13-37] 22 (01/02 0700) SpO2:  [91 %-98 %] 97 % (01/02 0700) Weight:  [86.3 kg] 86.3 kg (01/02 0500)  Hemodynamic parameters for last 24 hours: CVP:  [14 mmHg-19 mmHg] 14 mmHg  LVAD INTERROGATION:  HeartMate III LVAD:   Flow 4.5 liters/min, speed 5400, power 3.9, PI 3.4.  No PI events   LDH 344 => 379 => 376 => 379 => 356 On low dose heparin gtt INR 1.9   Plts 113 => 121 => 185 => 248 Hgb 7.9 => 8.3 => 8.3  Intake/Output from previous day: 01/01 0701 - 01/02 0700 In: 2321.6 [P.O.:930; I.V.:1300; IV Piggyback:91.6] Out: 935 [Urine:935] Intake/Output this shift: No intake/output data recorded.  General appearance: alert, cooperative, and no distress Neurologic: intact Heart: regular rate and rhythm and LVAD hum Lungs: diminished breath sounds bibasilar Extremities: edema 3-4+ Wound: clean and dry, no erythema  Lab Results: Recent Labs    09/21/21 0344 09/21/21 0356 09/22/21 0406 09/22/21 0431  WBC 16.6*  --  14.7*  --   HGB 8.3*   < > 8.3* 8.2*  HCT 22.8*   < > 23.4* 24.0*  PLT 185  --  248  --    < > = values in this interval not displayed.   BMET:  Recent Labs    09/21/21 0344 09/21/21 0356 09/22/21 0406 09/22/21 0431  NA 131*   < > 130* 132*  K 4.1   < > 4.2 4.2  CL 96*  --  97*  --   CO2 23  --  22  --   GLUCOSE 67*  --  112*  --   BUN 45*  --  50*  --   CREATININE 3.33*  --  4.27*  --   CALCIUM 8.3*  --  8.0*  --    < > = values in this interval not displayed.    PT/INR:  Recent Labs     09/22/21 0406  LABPROT 21.9*  INR 1.9*   ABG    Component Value Date/Time   PHART 7.486 (H) 09/22/2021 0431   HCO3 22.0 09/22/2021 0431   TCO2 23 09/22/2021 0431   ACIDBASEDEF 1.0 09/22/2021 0431   O2SAT 96.0 09/22/2021 0431   CBG (last 3)  Recent Labs    09/22/21 0412 09/22/21 0417 09/22/21 0742  GLUCAP 140* 81 85    Assessment/Plan: S/P Procedure(s) (LRB): INSERTION OF IMPLANTABLE LEFT VENTRICULAR ASSIST DEVICE AND INSERTION OF FEMORAL ARTERIAL LINE (N/A) TRANSESOPHAGEAL ECHOCARDIOGRAM (TEE) (N/A) APPLICATION OF CELL SAVER (N/A) - NEURO- delirium resolved CV- good flows with LVAD, co-ox 60, CVp down to   CVP down to 12  Remains on Milrinone, Epi and Norepi  Speed increased to 5500 by Dr. Aundra Dubin  INR 1.9 on warfarin RESP- good saturation on 4L [place] RENAL- creatinine continues to rise now up to 4.27  Agree with plan for Nephrology consult  Still volume overloaded ENDO- CBG well controlled Gi- tolerating PO Right shoulder wound looks good, no output from drain- will dc   LOS: 16 days    Isaac [name] 09/22/2021

## 2021-09-22 NOTE — Consult Note (Signed)
Reason for Consult:Renal failure Referring Physician:  Dr. 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 Acute component either from cardiorenal +/- ATN with decreased renal function from hemodynamics and CPB + hypotensive episodes + pressors. No absolute indication for RRT but he is heading in that direction which I di/w the spouse who was bedside. Weights have increased from 78.4kg to 86.3kg which is c/w worsening edema. CVP ~12. - Agree with holding the Lasix for today but anticipate drop in UOP and will follow renal function in the AM. Likely will need to restart diuresis again tomorrow if weights continue to increase. - I hope we will not have to initiate CRRT but  if renal function continues to worsen over the next 24-72 hrs high likelihood of requiring CRRT. His BP is too soft and he will not tolerate iHD. Spouse expressed understanding. -Cont monitoring daily I/Os + weights  -Maintain MAP>65 for optimal renal perfusion.  -Avoid nephrotoxic medications including NSAIDs, contrast. - Dose medications for a GFR <20 ml/min.  HFrEF with underlying CM s/p HM3 with CVP in the 12 range with worsening edema despite Lasix but unfortunately renal function worsening. On Milrinone, epinephrine + Levophed.  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  Anemia Afib   HPI: Isaac [name] is an 71 y.o. male HFrEF <20% s/p AICD/PPM PAF on Eliquis, CASHD, HTN HLD, CKD 3b presenting with weakness worsening over a week prior to hospitalization with lightheadedness upon standing. He was on midodrine to help support his blood pressure. He has had exertional DOE, worsening lower extremity swelling but denied fever, chills, cough. In the ED BNP was elevated with CXR showing congestion.  Patient was started on milrinone with improvement in renal function and UOP. Patient also treated with Lasix + RHC  on 09/08/21 + Levophed for blood pressure support. Impella was placed on 09/12/2021. During this time his renal function improved with Cr nadir at 1.49 but since then renal function has slowly worsened.  Patient had a heart mate 3 placed on 12/28 + removal of the Impella. CPB time was 161 minutes.  There were periods during the operation where the MAP dropped as low as 50 for a short period of time and usually 60 or above. UOP has been very good while on Lasix which was discontinued after doses of 20m IV BID on 09/21/2020. Patient is on Epinephrine, Milrinone, Levophed. Renal function has continued to worsen slowly since 12/26. Last dose of Vancomycin on 12/30.  Creatinine was 2.02 12/09/2015 and BL ~1.6-2.   ROS Pertinent items are noted in HPI.  Chemistry and CBC: Creatinine  Date/Time Value Ref Range Status  07/06/2014 02:25 PM 1.45 (H) 0.60 - 1.30 mg/dL Final   Creatinine, Ser  Date/Time Value Ref Range Status  09/22/2021 04:06 AM 4.27 (H) 0.61 - 1.24 mg/dL Final  09/21/2021 03:44 AM 3.33 (H) 0.61 - 1.24 mg/dL Final  09/20/2021 07:46 PM 3.13 (H) 0.61 - 1.24 mg/dL Final  09/20/2021 02:51 AM 2.61 (H) 0.61 - 1.24 mg/dL Final  09/19/2021 05:08 PM 2.51 (H) 0.61 - 1.24 mg/dL Final  09/19/2021 03:58 AM 2.39 (H) 0.61 - 1.24 mg/dL Final  09/18/2021 03:05 PM 2.20 (H) 0.61 - 1.24 mg/dL Final  09/18/2021 04:07 AM 2.11 (H) 0.61 - 1.24 mg/dL Final  09/17/2021 08:57 PM 1.75 (H) 0.61 - 1.24 mg/dL Final  09/17/2021 03:55 PM 1.65 (H) 0.61 -  1.24 mg/dL Final  09/17/2021 01:17 PM 1.60 (H) 0.61 - 1.24 mg/dL Final  09/17/2021 12:02 PM 1.50 (H) 0.61 - 1.24 mg/dL Final  09/17/2021 10:56 AM 1.60 (H) 0.61 - 1.24 mg/dL Final  09/17/2021 10:00 AM 1.70 (H) 0.61 - 1.24 mg/dL Final  09/17/2021 08:52 AM 1.60 (H) 0.61 - 1.24 mg/dL Final  09/17/2021 03:32 AM 1.73 (H) 0.61 - 1.24 mg/dL Final  09/16/2021 06:25 PM 1.62 (H) 0.61 - 1.24 mg/dL Final  09/16/2021 04:15 AM 1.71 (H) 0.61 - 1.24 mg/dL Final  09/15/2021 04:08 AM  1.49 (H) 0.61 - 1.24 mg/dL Final  09/14/2021 04:20 AM 1.70 (H) 0.61 - 1.24 mg/dL Final  09/13/2021 05:17 AM 1.98 (H) 0.61 - 1.24 mg/dL Final  09/13/2021 12:20 AM 1.85 (H) 0.61 - 1.24 mg/dL Final  09/12/2021 04:28 PM 2.17 (H) 0.61 - 1.24 mg/dL Final  09/12/2021 09:09 AM 2.30 (H) 0.61 - 1.24 mg/dL Final  09/12/2021 02:20 AM 2.38 (H) 0.61 - 1.24 mg/dL Final  09/11/2021 01:32 PM 2.24 (H) 0.61 - 1.24 mg/dL Final  09/11/2021 01:45 AM 2.40 (H) 0.61 - 1.24 mg/dL Final  09/10/2021 03:18 AM 2.14 (H) 0.61 - 1.24 mg/dL Final  09/09/2021 05:19 AM 2.23 (H) 0.61 - 1.24 mg/dL Final  09/08/2021 02:22 PM 2.21 (H) 0.61 - 1.24 mg/dL Final  09/08/2021 01:41 AM 2.20 (H) 0.61 - 1.24 mg/dL Final  09/07/2021 03:47 AM 1.99 (H) 0.61 - 1.24 mg/dL Final  09/06/2021 12:42 PM 1.95 (H) 0.61 - 1.24 mg/dL Final  09/04/2021 02:43 PM 2.32 (H) 0.61 - 1.24 mg/dL Final  08/25/2021 05:00 AM 1.89 (H) 0.61 - 1.24 mg/dL Final  08/24/2021 06:05 AM 1.65 (H) 0.61 - 1.24 mg/dL Final  08/23/2021 04:55 AM 1.37 (H) 0.61 - 1.24 mg/dL Final  08/22/2021 05:10 AM 1.52 (H) 0.61 - 1.24 mg/dL Final  03/03/2020 10:20 AM 1.46 (H) 0.61 - 1.24 mg/dL Final  01/02/2016 01:57 PM 2.37 (H) 0.61 - 1.24 mg/dL Final  12/09/2015 12:22 AM 2.02 (H) 0.61 - 1.24 mg/dL Final   Recent Labs  Lab 09/18/21 0407 09/18/21 0451 09/18/21 1505 09/18/21 1804 09/19/21 0358 09/19/21 1524 09/19/21 1708 09/19/21 2146 09/20/21 0211 09/20/21 0251 09/20/21 1946 09/21/21 0344 09/21/21 0356 09/22/21 0406 09/22/21 0431  NA 130*   < > 133*   < > 132*   < > 132*   < > 134* 127* 133* 131* 133* 130* 132*  K 4.2   < > 4.8   < > 4.3   < > 4.2   < > 3.9 3.7 4.3 4.1 4.0 4.2 4.2  CL 98  --  99  --  101  --  98  --   --  93* 99 96*  --  97*  --   CO2 21*  --  23  --  23  --  24  --   --  23 25 23   --  22  --   GLUCOSE 118*  --  120*  --  127*  --  57*  --   --  357* 77 67*  --  112*  --   BUN 31*  --  34*  --  36*  --  38*  --   --  38* 40* 45*  --  50*  --   CREATININE  2.11*  --  2.20*  --  2.39*  --  2.51*  --   --  2.61* 3.13* 3.33*  --  4.27*  --   CALCIUM 8.0*  --  7.9*  --  7.9*  --  8.1*  --   --  7.9* 8.1* 8.3*  --  8.0*  --   PHOS 3.3  --   --   --  4.6  --   --   --   --  4.1  --  4.6  --  4.5  --    < > = values in this interval not displayed.   Recent Labs  Lab 09/19/21 0358 09/19/21 1042 09/20/21 0251 09/20/21 1709 09/21/21 0344 09/21/21 0356 09/22/21 0406 09/22/21 0431  WBC 13.6*   < > 16.7* 17.7* 16.6*  --  14.7*  --   NEUTROABS 11.4*  --  14.4*  --  14.6*  --  12.4*  --   HGB 7.5*   < > 7.9* 8.0* 8.3* 7.8* 8.3* 8.2*  HCT 21.2*   < > 21.5* 22.2* 22.8* 23.0* 23.4* 24.0*  MCV 84.8   < > 85.0 84.7 83.2  --  84.2  --   PLT 116*   < > 121* 152 185  --  248  --    < > = values in this interval not displayed.   Liver Function Tests: Recent Labs  Lab 09/18/21 0407 09/19/21 0358 09/20/21 0251  AST 122* 113* 86*  ALT 26 9 5   ALKPHOS 49 67 74  BILITOT 3.1* 4.3* 4.7*  PROT 4.7* 4.5* 4.8*  ALBUMIN 2.6* 2.3* 2.3*   No results for input(s): LIPASE, AMYLASE in the last 168 hours. Recent Labs  Lab 09/20/21 0701  AMMONIA <10   Cardiac Enzymes: No results for input(s): CKTOTAL, CKMB, CKMBINDEX, TROPONINI in the last 168 hours. Iron Studies: No results for input(s): IRON, TIBC, TRANSFERRIN, FERRITIN in the last 72 hours. PT/INR: @LABRCNTIP (inr:5)  Xrays/Other Studies: ) Results for orders placed or performed during the hospital encounter of 09/06/21 (from the past 48 hour(s))  Glucose, capillary     Status: Abnormal   Collection Time: 09/20/21 11:26 AM  Result Value Ref Range   Glucose-Capillary 44 (LL) 70 - 99 mg/dL    Comment: Glucose reference range applies only to samples taken after fasting for at least 8 hours.   Comment 1 Notify RN    Comment 2 Repeat Test   Glucose, capillary     Status: Abnormal   Collection Time: 09/20/21 11:31 AM  Result Value Ref Range   Glucose-Capillary 47 (L) 70 - 99 mg/dL    Comment: Glucose  reference range applies only to samples taken after fasting for at least 8 hours.  Glucose, capillary     Status: Abnormal   Collection Time: 09/20/21  1:02 PM  Result Value Ref Range   Glucose-Capillary 278 (H) 70 - 99 mg/dL    Comment: Glucose reference range applies only to samples taken after fasting for at least 8 hours.  CBC     Status: Abnormal   Collection Time: 09/20/21  5:09 PM  Result Value Ref Range   WBC 17.7 (H) 4.0 - 10.5 K/uL   RBC 2.62 (L) 4.22 - 5.81 MIL/uL   Hemoglobin 8.0 (L) 13.0 - 17.0 g/dL   HCT 22.2 (L) 39.0 - 52.0 %   MCV 84.7 80.0 - 100.0 fL   MCH 30.5 26.0 - 34.0 pg   MCHC 36.0 30.0 - 36.0 g/dL   RDW 16.6 (H) 11.5 - 15.5 %   Platelets 152 150 - 400 K/uL   nRBC 0.0 0.0 - 0.2 %    Comment: Performed at [facility], [place] [address] [place], [place] [address]  Glucose, capillary     Status: Abnormal   Collection Time: 09/20/21  5:09 PM  Result Value Ref Range   Glucose-Capillary 56 (L) 70 - 99 mg/dL    Comment: Glucose reference range applies only to samples taken after fasting for at least 8 hours.  Basic metabolic panel     Status: Abnormal   Collection Time: 09/20/21  7:46 PM  Result Value Ref Range   Sodium 133 (L) 135 - 145 mmol/L   Potassium 4.3 3.5 - 5.1 mmol/L   Chloride 99 98 - 111 mmol/L   CO2 25 22 - 32 mmol/L   Glucose, Bld 77 70 - 99 mg/dL    Comment: Glucose reference range applies only to samples taken after fasting for at least 8 hours.   BUN 40 (H) 8 - 23 mg/dL   Creatinine, Ser 3.13 (H) 0.61 - 1.24 mg/dL   Calcium 8.1 (L) 8.9 - 10.3 mg/dL   GFR, Estimated 21 (L) >60 mL/min    Comment: (NOTE) Calculated using the CKD-EPI Creatinine Equation (2021)    Anion gap 9 5 - 15    Comment: Performed at [facility] [address], [place], [place] [address]  Glucose, capillary     Status: None   Collection Time: 09/20/21  7:51 PM  Result Value Ref Range   Glucose-Capillary 78 70 - 99 mg/dL    Comment: Glucose reference range  applies only to samples taken after fasting for at least 8 hours.  Glucose, capillary     Status: Abnormal   Collection Time: 09/20/21 11:42 PM  Result Value Ref Range   Glucose-Capillary 62 (L) 70 - 99 mg/dL    Comment: Glucose reference range applies only to samples taken after fasting for at least 8 hours.  Glucose, capillary     Status: None   Collection Time: 09/21/21 12:20 AM  Result Value Ref Range   Glucose-Capillary 97 70 - 99 mg/dL    Comment: Glucose reference range applies only to samples taken after fasting for at least 8 hours.  Lactate dehydrogenase (LDH)     Status: Abnormal   Collection Time: 09/21/21  3:44 AM  Result Value Ref Range   LDH 379 (H) 98 - 192 U/L    Comment: Performed at [facility], [place] [address], [place], [place] [address]  CBC with Differential     Status: Abnormal   Collection Time: 09/21/21  3:44 AM  Result Value Ref Range   WBC 16.6 (H) 4.0 - 10.5 K/uL   RBC 2.74 (L) 4.22 - 5.81 MIL/uL   Hemoglobin 8.3 (L) 13.0 - 17.0 g/dL   HCT 22.8 (L) 39.0 - 52.0 %   MCV 83.2 80.0 - 100.0 fL   MCH 30.3 26.0 - 34.0 pg   MCHC 36.4 (H) 30.0 - 36.0 g/dL   RDW 16.6 (H) 11.5 - 15.5 %   Platelets 185 150 - 400 K/uL   nRBC 0.0 0.0 - 0.2 %   Neutrophils Relative % 88 %   Neutro Abs 14.6 (H) 1.7 - 7.7 K/uL   Lymphocytes Relative 3 %   Lymphs Abs 0.5 (L) 0.7 - 4.0 K/uL   Monocytes Relative 6 %   Monocytes Absolute 1.0 0.1 - 1.0 K/uL   Eosinophils Relative 2 %   Eosinophils Absolute 0.3 0.0 - 0.5 K/uL   Basophils Relative 0 %   Basophils Absolute 0.1 0.0 - 0.1 K/uL  Immature Granulocytes 1 %   Abs Immature Granulocytes 0.14 (H) 0.00 - 0.07 K/uL    Comment: Performed at [facility], [place] [address], [place], [place] [address]  Magnesium     Status: None   Collection Time: 09/21/21  3:44 AM  Result Value Ref Range   Magnesium 2.3 1.7 - 2.4 mg/dL    Comment: Performed at [facility] [address], [place], [place] [address]  Phosphorus      Status: None   Collection Time: 09/21/21  3:44 AM  Result Value Ref Range   Phosphorus 4.6 2.5 - 4.6 mg/dL    Comment: Performed at [facility] [address], [place], [place] [address]  Protime-INR     Status: Abnormal   Collection Time: 09/21/21  3:44 AM  Result Value Ref Range   Prothrombin Time 17.3 (H) 11.4 - 15.2 seconds   INR 1.4 (H) 0.8 - 1.2    Comment: (NOTE) INR goal varies based on device and disease states. Performed at [facility], [place] [address] [place], [place] [address]   Heparin level (unfractionated)     Status: Abnormal   Collection Time: 09/21/21  3:44 AM  Result Value Ref Range   Heparin Unfractionated <0.10 (L) 0.30 - 0.70 IU/mL    Comment: (NOTE) The clinical reportable range upper limit is being lowered to >1.10 to align with the FDA approved guidance for the current laboratory assay.  If heparin results are below expected values, and patient dosage has  been confirmed, suggest follow up testing of antithrombin III levels. Performed at [facility], [place] [address], [place], [place] [address]   Basic metabolic panel     Status: Abnormal   Collection Time: 09/21/21  3:44 AM  Result Value Ref Range   Sodium 131 (L) 135 - 145 mmol/L   Potassium 4.1 3.5 - 5.1 mmol/L   Chloride 96 (L) 98 - 111 mmol/L   CO2 23 22 - 32 mmol/L   Glucose, Bld 67 (L) 70 - 99 mg/dL    Comment: Glucose reference range applies only to samples taken after fasting for at least 8 hours.   BUN 45 (H) 8 - 23 mg/dL   Creatinine, Ser 3.33 (H) 0.61 - 1.24 mg/dL   Calcium 8.3 (L) 8.9 - 10.3 mg/dL   GFR, Estimated 19 (L) >60 mL/min    Comment: (NOTE) Calculated using the CKD-EPI Creatinine Equation (2021)    Anion gap 12 5 - 15    Comment: Performed at [facility] [address], [place], [place] [address]  I-STAT 7, (LYTES, BLD GAS, ICA, H+H)     Status: Abnormal   Collection Time: 09/21/21  3:56 AM  Result Value Ref Range   pH, Arterial 7.486 (H)  7.350 - 7.450   pCO2 arterial 31.2 (L) 32.0 - 48.0 mmHg   pO2, Arterial 96 83.0 - 108.0 mmHg   Bicarbonate 23.6 20.0 - 28.0 mmol/L   TCO2 25 22 - 32 mmol/L   O2 Saturation 98.0 %   Acid-Base Excess 0.0 0.0 - 2.0 mmol/L   Sodium 133 (L) 135 - 145 mmol/L   Potassium 4.0 3.5 - 5.1 mmol/L   Calcium, Ion 1.15 1.15 - 1.40 mmol/L   HCT 23.0 (L) 39.0 - 52.0 %   Hemoglobin 7.8 (L) 13.0 - 17.0 g/dL   Patient temperature 36.7 C    Sample type ARTERIAL   Glucose, capillary  Status: None   Collection Time: 09/21/21  4:00 AM  Result Value Ref Range   Glucose-Capillary 73 70 - 99 mg/dL    Comment: Glucose reference range applies only to samples taken after fasting for at least 8 hours.  Cooxemetry Panel (carboxy, met, total hgb, O2 sat)     Status: Abnormal   Collection Time: 09/21/21  5:59 AM  Result Value Ref Range   Total hemoglobin 8.0 (L) 12.0 - 16.0 g/dL   O2 Saturation 62.8 %   Carboxyhemoglobin 1.2 0.5 - 1.5 %   Methemoglobin 1.0 0.0 - 1.5 %    Comment: Performed at [facility], [place] [address], [place], [place] [address]  Glucose, capillary     Status: Abnormal   Collection Time: 09/21/21  9:08 AM  Result Value Ref Range   Glucose-Capillary 42 (LL) 70 - 99 mg/dL    Comment: Glucose reference range applies only to samples taken after fasting for at least 8 hours.  Glucose, capillary     Status: None   Collection Time: 09/21/21 11:26 AM  Result Value Ref Range   Glucose-Capillary 78 70 - 99 mg/dL    Comment: Glucose reference range applies only to samples taken after fasting for at least 8 hours.  Glucose, capillary     Status: None   Collection Time: 09/21/21  4:20 PM  Result Value Ref Range   Glucose-Capillary 75 70 - 99 mg/dL    Comment: Glucose reference range applies only to samples taken after fasting for at least 8 hours.  Glucose, capillary     Status: None   Collection Time: 09/21/21  8:06 PM  Result Value Ref Range   Glucose-Capillary 78 70 - 99 mg/dL     Comment: Glucose reference range applies only to samples taken after fasting for at least 8 hours.  Glucose, capillary     Status: Abnormal   Collection Time: 09/22/21 12:02 AM  Result Value Ref Range   Glucose-Capillary 110 (H) 70 - 99 mg/dL    Comment: Glucose reference range applies only to samples taken after fasting for at least 8 hours.  Lactate dehydrogenase (LDH)     Status: Abnormal   Collection Time: 09/22/21  4:06 AM  Result Value Ref Range   LDH 356 (H) 98 - 192 U/L    Comment: Performed at [facility], [place] [address], [place], [place] [address]  CBC with Differential     Status: Abnormal   Collection Time: 09/22/21  4:06 AM  Result Value Ref Range   WBC 14.7 (H) 4.0 - 10.5 K/uL   RBC 2.78 (L) 4.22 - 5.81 MIL/uL   Hemoglobin 8.3 (L) 13.0 - 17.0 g/dL   HCT 23.4 (L) 39.0 - 52.0 %   MCV 84.2 80.0 - 100.0 fL   MCH 29.9 26.0 - 34.0 pg   MCHC 35.5 30.0 - 36.0 g/dL   RDW 16.9 (H) 11.5 - 15.5 %   Platelets 248 150 - 400 K/uL   nRBC 0.0 0.0 - 0.2 %   Neutrophils Relative % 85 %   Neutro Abs 12.4 (H) 1.7 - 7.7 K/uL   Lymphocytes Relative 4 %   Lymphs Abs 0.6 (L) 0.7 - 4.0 K/uL   Monocytes Relative 9 %   Monocytes Absolute 1.3 (H) 0.1 - 1.0 K/uL   Eosinophils Relative 1 %   Eosinophils Absolute 0.2 0.0 - 0.5 K/uL   Basophils Relative 0 %   Basophils Absolute 0.1 0.0 - 0.1  K/uL   Immature Granulocytes 1 %   Abs Immature Granulocytes 0.10 (H) 0.00 - 0.07 K/uL    Comment: Performed at [facility], [place] [address] [place], [place] [address]  Magnesium     Status: None   Collection Time: 09/22/21  4:06 AM  Result Value Ref Range   Magnesium 2.3 1.7 - 2.4 mg/dL    Comment: Performed at [facility] [address] [place], [place] [address]  Phosphorus     Status: None   Collection Time: 09/22/21  4:06 AM  Result Value Ref Range   Phosphorus 4.5 2.5 - 4.6 mg/dL    Comment: Performed at [facility] [address], [place], [place] [address]   Protime-INR     Status: Abnormal   Collection Time: 09/22/21  4:06 AM  Result Value Ref Range   Prothrombin Time 21.9 (H) 11.4 - 15.2 seconds   INR 1.9 (H) 0.8 - 1.2    Comment: (NOTE) INR goal varies based on device and disease states. Performed at [facility], [place] [address], [place], [place] [address]   Cooxemetry Panel (carboxy, met, total hgb, O2 sat)     Status: Abnormal   Collection Time: 09/22/21  4:06 AM  Result Value Ref Range   Total hemoglobin 7.9 (L) 12.0 - 16.0 g/dL   O2 Saturation 60.1 %   Carboxyhemoglobin 1.2 0.5 - 1.5 %   Methemoglobin 0.9 0.0 - 1.5 %    Comment: Performed at [facility], [place] [address] [place], [place] [address]  Heparin level (unfractionated)     Status: Abnormal   Collection Time: 09/22/21  4:06 AM  Result Value Ref Range   Heparin Unfractionated <0.10 (L) 0.30 - 0.70 IU/mL    Comment: (NOTE) The clinical reportable range upper limit is being lowered to >1.10 to align with the FDA approved guidance for the current laboratory assay.  If heparin results are below expected values, and patient dosage has  been confirmed, suggest follow up testing of antithrombin III levels. Performed at [facility], [place] [address], [place], [place] [address]   Basic metabolic panel     Status: Abnormal   Collection Time: 09/22/21  4:06 AM  Result Value Ref Range   Sodium 130 (L) 135 - 145 mmol/L   Potassium 4.2 3.5 - 5.1 mmol/L   Chloride 97 (L) 98 - 111 mmol/L   CO2 22 22 - 32 mmol/L   Glucose, Bld 112 (H) 70 - 99 mg/dL    Comment: Glucose reference range applies only to samples taken after fasting for at least 8 hours.   BUN 50 (H) 8 - 23 mg/dL   Creatinine, Ser 4.27 (H) 0.61 - 1.24 mg/dL   Calcium 8.0 (L) 8.9 - 10.3 mg/dL   GFR, Estimated 14 (L) >60 mL/min    Comment: (NOTE) Calculated using the CKD-EPI Creatinine Equation (2021)    Anion gap 11 5 - 15    Comment: Performed at [facility] [address],  [place], [place] [address]  Glucose, capillary     Status: Abnormal   Collection Time: 09/22/21  4:12 AM  Result Value Ref Range   Glucose-Capillary 140 (H) 70 - 99 mg/dL    Comment: Glucose reference range applies only to samples taken after fasting for at least 8 hours.  Glucose, capillary     Status: None   Collection Time: 09/22/21  4:17 AM  Result  Value Ref Range   Glucose-Capillary 81 70 - 99 mg/dL    Comment: Glucose reference range applies only to samples taken after fasting for at least 8 hours.  I-STAT 7, (LYTES, BLD GAS, ICA, H+H)     Status: Abnormal   Collection Time: 09/22/21  4:31 AM  Result Value Ref Range   pH, Arterial 7.486 (H) 7.350 - 7.450   pCO2 arterial 29.2 (L) 32.0 - 48.0 mmHg   pO2, Arterial 78 (L) 83.0 - 108.0 mmHg   Bicarbonate 22.0 20.0 - 28.0 mmol/L   TCO2 23 22 - 32 mmol/L   O2 Saturation 96.0 %   Acid-base deficit 1.0 0.0 - 2.0 mmol/L   Sodium 132 (L) 135 - 145 mmol/L   Potassium 4.2 3.5 - 5.1 mmol/L   Calcium, Ion 1.15 1.15 - 1.40 mmol/L   HCT 24.0 (L) 39.0 - 52.0 %   Hemoglobin 8.2 (L) 13.0 - 17.0 g/dL   Patient temperature 37.5 C    Sample type ARTERIAL   Glucose, capillary     Status: None   Collection Time: 09/22/21  7:42 AM  Result Value Ref Range   Glucose-Capillary 85 70 - 99 mg/dL    Comment: Glucose reference range applies only to samples taken after fasting for at least 8 hours.   DG CHEST PORT 1 VIEW  Result Date: 09/21/2021 CLINICAL DATA:  Evaluate for pneumonia EXAM: PORTABLE CHEST 1 VIEW COMPARISON:  09/20/2021 FINDINGS: Right arm PICC line tip terminates over the distal SVC. There is a left IJ Cordis with tip terminating in the distal left internal jugular vein. Left chest wall ICD is noted with leads in the right atrial appendage and right ventricle. LVAD device partially imaged projecting over the left cardiac apex. Status post median sternotomy. Bilateral chest tubes are in place. No pneumothorax visualized. Small pleural effusion is  identified on the right. Pulmonary vascular congestion appears unchanged. IMPRESSION: 1. Stable support apparatus. 2. Small right pleural effusion with pulmonary vascular congestion suggestive of mild CHF. Electronically Signed   By: Kerby Moors M.D.   On: 09/21/2021 10:07    PMH:   Past Medical History:  Diagnosis Date   Arrhythmia    atrial fibrillation   CHF (congestive heart failure) (HCC)    Chronic kidney disease    Coronary artery disease    Hyperlipidemia    Hypertension    Myocardial infarct (HCC)     PSH:   Past Surgical History:  Procedure Laterality Date   CARDIAC DEFIBRILLATOR PLACEMENT  feb 2014   INSERTION OF IMPLANTABLE LEFT VENTRICULAR ASSIST DEVICE N/A 09/17/2021   Procedure: INSERTION OF IMPLANTABLE LEFT VENTRICULAR ASSIST DEVICE AND INSERTION OF FEMORAL ARTERIAL LINE;  Surgeon: Gaye Pollack, MD;  Location: [facility];  Service: Open Heart Surgery;  Laterality: N/A;   PLACEMENT OF IMPELLA LEFT VENTRICULAR ASSIST DEVICE N/A 09/12/2021   Procedure: PLACEMENT OF IMPELLA 5.5 LEFT VENTRICULAR ASSIST DEVICE;  Surgeon: Melrose Nakayama, MD;  Location: [facility];  Service: Open Heart Surgery;  Laterality: N/A;   RIGHT HEART CATH N/A 09/08/2021   Procedure: RIGHT HEART CATH;  Surgeon: Larey Dresser, MD;  Location: [facility];  Service: Cardiovascular;  Laterality: N/A;   TEE WITHOUT CARDIOVERSION N/A 09/12/2021   Procedure: TRANSESOPHAGEAL ECHOCARDIOGRAM (TEE);  Surgeon: Melrose Nakayama, MD;  Location: [facility];  Service: Open Heart Surgery;  Laterality: N/A;   TEE WITHOUT CARDIOVERSION N/A 09/17/2021   Procedure: TRANSESOPHAGEAL ECHOCARDIOGRAM (TEE);  Surgeon: Gaye Pollack,  MD;  Location: [facility];  Service: Open Heart Surgery;  Laterality: N/A;    Allergies:  Allergies  Allergen Reactions   Mushroom Extract Complex Nausea And Vomiting   Neosporin [Neomycin-Bacitracin Zn-Polymyx] Hives    Medications:   Prior to Admission medications   Medication Sig  Start Date End Date Taking? Authorizing Provider  albuterol (VENTOLIN HFA) 108 (90 Base) MCG/ACT inhaler Inhale 2 puffs into the lungs every 6 (six) hours as needed. 05/01/21  Yes [provider]  apixaban (ELIQUIS) 5 MG TABS tablet Take 1 tablet (5 mg total) by mouth 2 (two) times daily. 08/24/21 11/22/21 Yes Dahal, Marlowe Aschoff, MD  furosemide (LASIX) 20 MG tablet Take 1 tablet (20 mg total) by mouth daily as needed for edema or fluid (sob). Patient taking differently: Take 20 mg by mouth daily. 08/24/21 11/22/21 Yes Dahal, Marlowe Aschoff, MD  midodrine (PROAMATINE) 2.5 MG tablet Take 1 tablet (2.5 mg total) by mouth 3 (three) times daily with meals. 08/24/21 11/22/21 Yes Dahal, Marlowe Aschoff, MD  amiodarone (PACERONE) 200 MG tablet Take 1 tablet (200 mg total) by mouth 2 (two) times daily. Patient not taking: Reported on 08/29/2021 08/24/21 11/22/21  Terrilee Croak, MD  metoprolol tartrate (LOPRESSOR) 25 MG tablet Take 0.5 tablets (12.5 mg total) by mouth 2 (two) times daily. Patient not taking: Reported on 08/29/2021 08/24/21 11/22/21  Terrilee Croak, MD  montelukast (SINGULAIR) 10 MG tablet Take by mouth. Patient not taking: Reported on 09/06/2021 10/23/19 10/22/20  [provider]  spironolactone (ALDACTONE) 25 MG tablet Take 0.5 tablets (12.5 mg total) by mouth daily. Patient not taking: Reported on 08/29/2021 08/24/21 11/22/21  Terrilee Croak, MD  tamsulosin (FLOMAX) 0.4 MG CAPS capsule TAKE 1 CAPSULE (0.4 MG TOTAL) BY MOUTH DAILY AFTER BREAKFAST. Patient not taking: Reported on 08/29/2021 04/24/20   Zara Council A, PA-C    Discontinued Meds:   Medications Discontinued During This Encounter  Medication Reason   apixaban (ELIQUIS) tablet 5 mg    fluticasone (FLONASE) 50 MCG/ACT nasal spray Patient Preference   furosemide (LASIX) injection 40 mg    metoprolol tartrate (LOPRESSOR) tablet 12.5 mg    furosemide (LASIX) injection 80 mg    heparin ADULT infusion 100 units/mL (25000 units/255m)    amiodarone (NEXTERONE  PREMIX) 360-4.14 MG/200ML-% (1.8 mg/mL) IV infusion    amiodarone (NEXTERONE PREMIX) 360-4.14 MG/200ML-% (1.8 mg/mL) IV infusion    amiodarone (NEXTERONE PREMIX) 360-4.14 MG/200ML-% (1.8 mg/mL) IV infusion    milrinone (PRIMACOR) 20 MG/100 ML (0.2 mg/mL) infusion    heparin ADULT infusion 100 units/mL (25000 units/2561m Dose change   furosemide (LASIX) 200 mg in dextrose 5 % 100 mL (2 mg/mL) infusion    sodium chloride flush (NS) 0.9 % injection 3 mL Patient Transfer   0.9 %  sodium chloride infusion Patient Transfer   0.9 %  sodium chloride infusion Patient Transfer   sodium chloride flush (NS) 0.9 % injection 3 mL Patient Transfer   0.9 %  sodium chloride infusion Patient Transfer   aspirin chewable tablet 81 mg Patient Transfer   0.9 %  sodium chloride infusion Patient Transfer   Heparin (Porcine) in NaCl 1000-0.9 UT/500ML-% SOLN Patient Transfer   fentaNYL (SUBLIMAZE) injection Patient Transfer   midazolam (VERSED) injection Patient Transfer   lidocaine (PF) (XYLOCAINE) 1 % injection Patient Transfer   midodrine (PROAMATINE) tablet 2.5 mg    potassium chloride (KLOR-CON) packet 20 mEq    norepinephrine (LEVOPHED) 61m30mn 250m70m.016 mg/mL) premix infusion    norepinephrine (LEVOPHED) 4-5  MG/250ML-% infusion SOLN Returned to ADS   furosemide (LASIX) injection 40 mg    amiodarone (NEXTERONE PREMIX) 360-4.14 MG/200ML-% (1.8 mg/mL) IV infusion    amiodarone (NEXTERONE PREMIX) 360-4.14 MG/200ML-% (1.8 mg/mL) IV infusion    furosemide (LASIX) injection 40 mg    furosemide (LASIX) injection 80 mg    potassium chloride (KLOR-CON) packet 40 mEq    ceFEPIme (MAXIPIME) 2 g in sodium chloride 0.9 % 100 mL IVPB    heparin 6000 units / NS 500 mL irrigation Patient Discharge   hemostatic agents (no charge) Optime Patient Discharge   0.9 % irrigation (POUR BTL) Patient Discharge   heparin 50 units/mL (Impella PURGE) in dextrose 5% 1000 mL    vancomycin (VANCOREADY) IVPB 1500 mg/300 mL Change in  therapy   colchicine tablet 0.6 mg    heparin ADULT infusion 100 units/mL (25000 units/211m)    mupirocin ointment (BACTROBAN) 2 % 1 application    norepinephrine (LEVOPHED) 16 mg in 2571mpremix infusion    EPINEPHrine (ADRENALIN) 5 mg in NS 250 mL (0.02 mg/mL) premix infusion    colchicine tablet 0.3 mg    Thrombi-Pad 3"X3" pad 1 each    sodium chloride flush (NS) 0.9 % injection 3 mL    ceFEPIme (MAXIPIME) 2 g in sodium chloride 0.9 % 100 mL IVPB    furosemide (LASIX) injection 80 mg    vancomycin (VANCOCIN) IVPB 1000 mg/200 mL premix    potassium chloride SA (KLOR-CON M) CR tablet 40 mEq    [facility] Blood Cardioplegia vial (lidocaine/magnesium/mannitol 0.4.62M-6N-8.1RDuplicate   sodium chloride irrigation 0.9 % Patient Discharge   sodium chloride (PF) 0.9 % injection Patient Discharge   hemostatic agents (no charge) Optime Patient Discharge   vancomycin (VANCOCIN) powder Patient Discharge   Surgifoam 1 Gm with Thrombin 20,000 units (20 ml) topical solution Patient Discharge   0.9 % irrigation (POUR BTL) Patient Discharge   sodium chloride flush (NS) 0.9 % injection 3 mL    sodium chloride flush (NS) 0.9 % injection 3 mL    0.9 %  sodium chloride infusion    acetaminophen (TYLENOL) tablet 650 mg    ondansetron (ZOFRAN) injection 4 mg    Chlorhexidine Gluconate Cloth 2 % PADS 6 each    sodium chloride flush (NS) 0.9 % injection 10-40 mL    sodium chloride flush (NS) 0.9 % injection 10-40 mL    sodium chloride flush (NS) 0.9 % injection 3 mL    senna-docusate (Senokot-S) tablet 2 tablet    amiodarone (NEXTERONE PREMIX) 360-4.14 MG/200ML-% (1.8 mg/mL) IV infusion    0.9 %  sodium chloride infusion (Manually program via Guardrails IV Fluids)    heparin 50 units/mL (Impella PURGE) in dextrose 5% 1000 mL    morphine 2 MG/ML injection 2-4 mg    oxyCODONE (Oxy IR/ROXICODONE) immediate release tablet 5-10 mg    lactated ringers infusion    Thrombi-Pad 3"X3" pad 1 each    heparin  ADULT infusion 100 units/mL (25000 units/25036m   DOBUTamine (DOBUTREX) infusion 4000 mcg/mL    DOPamine (INTROPIN) 800 mg in dextrose 5 % 250 mL (3.2 mg/mL) infusion    nitroGLYCERIN 50 mg in dextrose 5 % 250 mL (0.2 mg/mL) infusion    phenylephrine (NEO-SYNEPHRINE) 22m24m 250mL30mmix infusion    heparin 30,000 units/NS 1000 mL solution for CELLSAVER    potassium chloride injection 80 mEq    tranexamic acid (CYKLOKAPRON) pump prime solution 155 mg    vancomycin (VANCOCIN) powder 1,000 mg  [facility] Blood Cardioplegia vial (lidocaine/magnesium/mannitol 0.26g-4g-6.4g)    mupirocin ointment (BACTROBAN) 2 % 1 application    temazepam (RESTORIL) capsule 15 mg    0.9 %  sodium chloride infusion    norepinephrine (LEVOPHED) 70m in 2570m(0.016 mg/mL) premix infusion    montelukast (SINGULAIR) tablet 5 mg    rosuvastatin (CRESTOR) tablet 10 mg    feeding supplement (ENSURE ENLIVE / ENSURE PLUS) liquid 237 mL    multivitamin with minerals tablet 1 tablet    norepinephrine (LEVOPHED) 3m63mn 250m47m.016 mg/mL) premix infusion    norepinephrine (LEVOPHED) 3mg 99m250mL 19m16 mg/mL) premix infusion    EPINEPHrine (ADRENALIN) 5 mg in NS 250 mL (0.02 mg/mL) premix infusion    ceFEPIme (MAXIPIME) 2 g in sodium chloride 0.9 % 100 mL IVPB    vancomycin (VANCOCIN) IVPB 1000 mg/200 mL premix    insulin regular, human (MYXREDLIN) 100 units/ 100 mL infusion    dextrose 50 % solution 0-50 mL    traMADol (ULTRAM) tablet 50-100 mg    oxyCODONE (Oxy IR/ROXICODONE) immediate release tablet 5-10 mg    montelukast (SINGULAIR) tablet 5 mg    rosuvastatin (CRESTOR) tablet 10 mg    docusate sodium (COLACE) capsule 200 mg    pantoprazole (PROTONIX) EC tablet 40 mg    multivitamin with minerals tablet 1 tablet    aspirin EC tablet 325 mg025plicate   aspirin suppository 300 mg427plicate   feeding supplement (ENSURE ENLIVE / ENSURE PLUS) liquid 237 mL    MEDLINE mouth rinse    insulin detemir (LEVEMIR)  injection 20 Units    docusate (COLACE) 50 MG/5ML liquid 200 mg    pantoprazole sodium (PROTONIX) 40 mg/20 mL oral suspension 40 mg    multivitamin liquid 15 mL    aspirin chewable tablet 324 mg    traMADol (ULTRAM) tablet 50 mg    rosuvastatin (CRESTOR) tablet 10 mg    montelukast (SINGULAIR) tablet 5 mg    oxyCODONE (Oxy IR/ROXICODONE) immediate release tablet 5-10 mg    feeding supplement (ENSURE ENLIVE / ENSURE PLUS) liquid 237 mL    lactated ringers infusion 500 mL    midazolam (VERSED) injection 2 mg    dexmedetomidine (PRECEDEX) 400 MCG/100ML (4 mcg/mL) infusion    vasopressin (PITRESSIN) 20 Units in sodium chloride 0.9 % 100 mL infusion-*FOR SHOCK*    potassium chloride SA (KLOR-CON M) CR tablet 20 mEq    potassium chloride (KLOR-CON) packet 20 mEq    potassium chloride (KLOR-CON) packet 20 mEq    dextrose 10 % infusion    warfarin (COUMADIN) tablet 2.5 mg    amiodarone (NEXTERONE PREMIX) 360-4.14 MG/200ML-% (1.8 mg/mL) IV infusion     Social History:  reports that he has quit smoking. He has never used smokeless tobacco. He reports that he does not drink alcohol and does not use drugs.  Family History:  History reviewed. No pertinent family history.  Blood pressure (!) 80/51, pulse (!) 133, temperature 99.3 F (37.4 C), resp. rate (!) 22, height 5' 9"  (1.753 m), weight 86.3 kg, SpO2 97 %. General appearance: alert, cooperative, and appears stated age Head: Normocephalic, without obvious abnormality, atraumatic Eyes: negative Neck: no adenopathy, no carotid bruit, supple, symmetrical, trachea midline, thyroid not enlarged, symmetric, no tenderness/mass/nodules, and JVD present Back: symmetric, no curvature. ROM normal. No CVA tenderness. Resp: clear to auscultation bilaterally Chest wall: incisions closed Cardio: Hum from LVAD Extremities: edema present in LE but worse in UE Pulses: 2+ and symmetric  Dwana Melena, MD 09/22/2021, 9:33 AM

## 2021-09-22 NOTE — Progress Notes (Signed)
EKG CRITICAL VALUE     12 lead EKG performed.  Critical value noted.  Misha Ferolto RN notified.   Warren Lacy, CCT 09/22/2021 9:01 AM

## 2021-09-22 NOTE — Progress Notes (Signed)
Nutrition Follow-up  DOCUMENTATION CODES:   Severe malnutrition in context of chronic illness  INTERVENTION:   Ensure Enlive po TID, each supplement provides 350 kcal and 20 grams of protein  No BM since 12/21 (12 days)-recommend strengthening bowel regimen to include suppository/enema until pt has BM  Cater to pt preferences; pt is a pescetarian, does eat eggs.   Continue MVI with Minerals  NUTRITION DIAGNOSIS:   Severe Malnutrition related to chronic illness (heart failure) as evidenced by severe fat depletion, severe muscle depletion.  Being addressed via supplements  GOAL:   Patient will meet greater than or equal to 90% of their needs  Progressing  MONITOR:   PO intake, Supplement acceptance, Labs, Weight trends  REASON FOR ASSESSMENT:   Consult LVAD Eval  ASSESSMENT:   71 yo male admitted with acute on chronic CHF. PMH includes CHF, CAD, CKD, HLD, HTN  12/23 Impella 5.5 placed 12/28 HM3 LVAD plaecd, Impella removed, Intubated 12/29 Extubated 12/31 FL diet 01/02 Diet advanced to 2g sodium  Pt walking around unit, less confused. Eating lunch on visit today; eating fish, mashed potatoes and green beans.   Family at bedside and reports pt ate very little over the last 2 days.  PO intake post extubation 50-100%. Pt continues to drink Ensure supplements  Noted episodes of hypoglycemia. Only on sliding scale insulin, not receiving. Encourage po intake  Continues without BM, drinking sorbitol on visit.   Weight up to 86.3 kg; lowest 74 kg.   UOP less than 1 L; net +1.4 L in 24 hours Worsening kidney function; Creatinine 4.27. Noted possibility of RRT  Labs: sodium 132 (L), CBG 69-140 Meds: ss novolog, MVI with Minerals, colace,   Diet Order:   Diet Order             Diet 2 gram sodium Room service appropriate? Yes; Fluid consistency: Thin  Diet effective now                   EDUCATION NEEDS:   Education needs have been addressed  Skin:   Skin Assessment: Reviewed RN Assessment  Last BM:  12/21  Height:   Ht Readings from Last 1 Encounters:  09/17/21 5\' 9"  (1.753 m)    Weight:   Wt Readings from Last 1 Encounters:  09/22/21 86.3 kg    BMI:  Body mass index is 28.1 kg/m.  Estimated Nutritional Needs:   Kcal:  2300-2500 kcals  Protein:  135-160 g  Fluid:  1.8 L    Kerman Passey MS, RDN, LDN, CNSC Registered Dietitian III Clinical Nutrition RD Pager and On-Call Pager Number Located in [place]

## 2021-09-22 NOTE — Evaluation (Signed)
Occupational Therapy Re-Evaluation Patient Details Name: Isaac [name] MRN: [id number] DOB: 1951-07-19 Today's Date: 09/22/2021   History of Present Illness Pt is a 71 y.o. male admitted 09/07/21 with near-syncope, nausea, poor oral intake, generalized weakness. Workup for advanced heart failure and persistent inotropic with requirement, cardiogenic shock;. [place] 12/19. Impella placed on 12/23. LVAD placed 12/28. Pt with post op delirium and agitation.  PMH includes advanced CHF (LVEF <20%), AICD/PPM, PAF on Eliquis, CKD 3, HTN, HLD.   Clinical Impression   Pt seen s/p LVAD. Needing more assist than prior to LVAD due to weakness and lines. Slow processing speed and ability to follow commands. Pt ambulated in hall with min assist and second person for safety with Harmon Pier walker and 6L 02, VSS. Returned to bed with moderate assistance.  Pt likely progress well as lines are removed. Began educating in LVAD equipment/items in black bag.       Recommendations for follow up therapy are one component of a multi-disciplinary discharge planning process, led by the attending physician.  Recommendations may be updated based on patient status, additional functional criteria and insurance authorization.   Follow Up Recommendations  No OT follow up    Assistance Recommended at Discharge Frequent or constant Supervision/Assistance  Functional Status Assessment  Patient has had a recent decline in their functional status and demonstrates the ability to make significant improvements in function in a reasonable and predictable amount of time.  Equipment Recommendations  BSC/3in1    Recommendations for Other Services       Precautions / Restrictions Precautions Precautions: Fall;Other (comment) Precaution Comments: LVAD Restrictions Weight Bearing Restrictions: No      Mobility Bed Mobility Overal bed mobility: Needs Assistance Bed Mobility: Rolling;Sit to Sidelying Rolling: Supervision        Sit to sidelying: Mod assist General bed mobility comments: cues for technique, assist for LEs into bed    Transfers Overall transfer level: Needs assistance Equipment used: 2 person hand held assist Transfers: Sit to/from Stand Sit to Stand: +2 physical assistance;Min assist           General transfer comment: cues for hands on knees, min assist to rise and steady      Balance Overall balance assessment: Needs assistance Sitting-balance support: No upper extremity supported Sitting balance-Leahy Scale: Fair     Standing balance support: Bilateral upper extremity supported Standing balance-Leahy Scale: Poor Standing balance comment: reliant on B UE support for ambulation                           ADL either performed or assessed with clinical judgement   ADL Overall ADL's : Needs assistance/impaired Eating/Feeding: Set up;Bed level   Grooming: Wash/dry hands;Bed level;Set up   Upper Body Bathing: Moderate assistance;Sitting   Lower Body Bathing: Maximal assistance;Sit to/from stand   Upper Body Dressing : Maximal assistance;Sitting   Lower Body Dressing: Maximal assistance;Sit to/from stand   Toilet Transfer: Minimal assistance;Ambulation;+2 for safety/equipment Harmon Pier walker)           Functional mobility during ADLs: Minimal assistance;+2 for safety/equipment (with Harmon Pier walker)       Vision Baseline Vision/History: 1 Wears glasses Ability to See in Adequate Light: 0 Adequate Patient Visual Report: No change from baseline       Perception     Praxis      Pertinent Vitals/Pain Pain Assessment: Faces Faces Pain Scale: Hurts little more Pain Location: chest Pain Descriptors / Indicators:  Sore Pain Intervention(s): Monitored during session;Repositioned     Hand Dominance Right   Extremity/Trunk Assessment Upper Extremity Assessment Upper Extremity Assessment: Generalized weakness       Cervical / Trunk Assessment Cervical / Trunk  Assessment: Normal   Communication Communication Communication: No difficulties   Cognition Arousal/Alertness: Awake/alert Behavior During Therapy: Flat affect Overall Cognitive Status: Impaired/Different from baseline Area of Impairment: Problem solving;Following commands                       Following Commands: Follows one step commands with increased time     Problem Solving: Slow processing;Decreased initiation;Requires verbal cues;Requires tactile cues General Comments: improving cognition     General Comments       Exercises     Shoulder Instructions      Home Living Family/patient expects to be discharged to:: Private residence Living Arrangements: Spouse/significant other Available Help at Discharge: Available 24 hours/day;Family Type of Home: House Home Access: Stairs to enter [employer] of Steps: 1   Home Layout: Bed/bath upstairs;Two level Alternate Level Stairs-Number of Steps: flight Alternate Level Stairs-Rails: Right Bathroom Shower/Tub: Occupational psychologist: Standard     Home Equipment: Cane - single point          Prior Functioning/Environment Prior Level of Function : Independent/Modified Independent;Driving             Mobility Comments: sometimes uses SPC, must take mutliple breaks ADLs Comments: generaly Mod I ; must take a lot of breaks due to fatigue. Wife assists with IADLs ad needed        OT Problem List: Decreased strength;Decreased activity tolerance;Impaired balance (sitting and/or standing);Decreased safety awareness;Decreased knowledge of use of DME or AE;Pain      OT Treatment/Interventions: Self-care/ADL training;DME and/or AE instruction;Therapeutic activities;Patient/family education;Balance training;Cognitive remediation/compensation    OT Goals(Current goals can be found in the care plan section) Acute Rehab OT Goals OT Goal Formulation: With patient Time For Goal Achievement:  10/06/21 Potential to Achieve Goals: Good  OT Frequency: Min 2X/week   Barriers to D/C:            Co-evaluation PT/OT/SLP Co-Evaluation/Treatment: Yes Reason for Co-Treatment: Complexity of the patient's impairments (multi-system involvement)   OT goals addressed during session: ADL's and self-care      AM-PAC OT "6 Clicks" Daily Activity     Outcome Measure Help from another person eating meals?: None Help from another person taking care of personal grooming?: A Little Help from another person toileting, which includes using toliet, bedpan, or urinal?: A Lot Help from another person bathing (including washing, rinsing, drying)?: A Lot Help from another person to put on and taking off regular upper body clothing?: A Lot Help from another person to put on and taking off regular lower body clothing?: A Lot 6 Click Score: 15   End of Session Equipment Utilized During Treatment: Oxygen (6L, eva walker) Nurse Communication: Mobility status  Activity Tolerance: Patient tolerated treatment well Patient left: in bed;with call bell/phone within reach;with nursing/sitter in room  OT Visit Diagnosis: Unsteadiness on feet (R26.81);Muscle weakness (generalized) (M62.81);Other symptoms and signs involving cognitive function;Other (comment);Pain (decreased activity tolerance)                Time: 3532-9924 OT Time Calculation (min): 31 min Charges:  OT General Charges $OT Visit: 1 Visit OT Evaluation $OT Re-eval: 1 Re-eval  Nestor Lewandowsky, OTR/L Acute Rehabilitation Services Pager: [phone] Office: [phone]  [name]  09/22/2021, 11:37 AM

## 2021-09-22 NOTE — TOC Initial Note (Signed)
Transition of Care [facility]) - Initial/Assessment Note    Patient Details  Name: Isaac [name] MRN: [id number] Date of Birth: [birth year]  Transition of Care [facility] CM/SW Contact:    Erenest Rasher, RN Phone Number: [phone] 09/22/2021, 4:06 PM  Clinical Narrative:                 HF TOC CM spoke to pt and offered choice for [facility]. Wife states they are interested in IP rehab. States they will discuss and get back to TOC CM. Will continue to follow for dc needs.   Expected Discharge Plan: IP Rehab Facility Barriers to Discharge: Continued Medical Work up   Patient Goals and CMS Choice   CMS Medicare.gov Compare Post Acute Care list provided to:: Patient Represenative (must comment) Choice offered to / list presented to : Spouse  Expected Discharge Plan and Services Expected Discharge Plan: [place]   Discharge Planning Services: CM Consult Post Acute Care Choice: IP Rehab Living arrangements for the past 2 months: Single Family Home   Prior Living Arrangements/Services Living arrangements for the past 2 months: Single Family Home Lives with:: Spouse Patient language and need for interpreter reviewed:: Yes Do you feel safe going back to the place where you live?: Yes      Need for Family Participation in Patient Care: Yes (Comment) Care giver support system in place?: Yes (comment)   Criminal Activity/Legal Involvement Pertinent to Current Situation/Hospitalization: No - Comment as needed  Activities of Daily Living Home Assistive Devices/Equipment: None ADL Screening (condition at time of admission) Patient's cognitive ability adequate to safely complete daily activities?: Yes Is the patient deaf or have difficulty hearing?: No Does the patient have difficulty seeing, even when wearing glasses/contacts?: No Does the patient have difficulty concentrating, remembering, or making decisions?: No Patient able to express need for assistance with ADLs?: No Does  the patient have difficulty dressing or bathing?: No Independently performs ADLs?: Yes (appropriate for developmental age) Does the patient have difficulty walking or climbing stairs?: Yes (SOB) Weakness of Legs: None Weakness of Arms/Hands: None  Permission Sought/Granted Permission sought to share information with : Case Manager, Family Supports, PCP Permission granted to share information with : Yes, Verbal Permission Granted  Share Information with NAME: Beniah Magnan  Permission granted to share info w AGENCY: IP rehab, Home Health, DME  Permission granted to share info w Relationship: wife  Permission granted to share info w Contact Information: [phone]  Emotional Assessment Appearance:: Appears stated age Attitude/Demeanor/Rapport: Engaged Affect (typically observed): Accepting Orientation: : Oriented to Self, Oriented to Place, Oriented to  Time, Oriented to Situation   Psych Involvement: No (comment)  Admission diagnosis:  CHF (congestive heart failure) (HCC) [I50.9] Syncope [R55] Elevated TSH [R79.89] Acute on chronic combined systolic and diastolic CHF (congestive heart failure) (HCC) [I50.43] Patient Active Problem List   Diagnosis Date Noted   LVAD (left ventricular assist device) present ([place])    Protein-calorie malnutrition, severe 09/10/2021   Elevated TSH    PICC (peripherally inserted central catheter) in place    Cardiogenic shock ([place])    Syncope 09/07/2021   CHF (congestive heart failure) ([place]) 09/06/2021   CAD (coronary artery disease) 08/22/2021   Cardiomyopathy ([place]) 08/22/2021   Mixed hyperlipidemia 08/22/2021   A-fib ([place]) 08/22/2021   Elevated troponin 08/22/2021   Acute on chronic combined systolic and diastolic CHF (congestive heart failure) ([place]) 08/22/2021   Benign essential hypertension 02/22/2015   Acute on chronic systolic CHF (  congestive heart failure), NYHA class 3 ([place]) 07/05/2014   PCP:  [name], MD Pharmacy:    [id number] [address], [place] [address] Phone: [phone] Fax: [phone]  [id number] [address] [place] [address] [place] [address] Phone: [phone] Fax: [phone]     Social Determinants of Health (SDOH) Interventions    Readmission Risk Interventions No flowsheet data found.

## 2021-09-23 ENCOUNTER — Inpatient Hospital Stay (HOSPITAL_COMMUNITY): Payer: Medicare HMO

## 2021-09-23 DIAGNOSIS — I5022 Chronic systolic (congestive) heart failure: Secondary | ICD-10-CM | POA: Diagnosis not present

## 2021-09-23 DIAGNOSIS — Z95811 Presence of heart assist device: Secondary | ICD-10-CM | POA: Diagnosis not present

## 2021-09-23 DIAGNOSIS — I5043 Acute on chronic combined systolic (congestive) and diastolic (congestive) heart failure: Secondary | ICD-10-CM | POA: Diagnosis not present

## 2021-09-23 DIAGNOSIS — N179 Acute kidney failure, unspecified: Secondary | ICD-10-CM | POA: Diagnosis not present

## 2021-09-23 DIAGNOSIS — I4819 Other persistent atrial fibrillation: Secondary | ICD-10-CM | POA: Diagnosis not present

## 2021-09-23 DIAGNOSIS — I5021 Acute systolic (congestive) heart failure: Secondary | ICD-10-CM | POA: Diagnosis not present

## 2021-09-23 DIAGNOSIS — R570 Cardiogenic shock: Secondary | ICD-10-CM | POA: Diagnosis not present

## 2021-09-23 DIAGNOSIS — I361 Nonrheumatic tricuspid (valve) insufficiency: Secondary | ICD-10-CM | POA: Diagnosis not present

## 2021-09-23 DIAGNOSIS — N183 Chronic kidney disease, stage 3 unspecified: Secondary | ICD-10-CM | POA: Diagnosis not present

## 2021-09-23 LAB — GLUCOSE, CAPILLARY
Glucose-Capillary: 78 mg/dL (ref 70–99)
Glucose-Capillary: 72 mg/dL (ref 70–99)
Glucose-Capillary: 124 mg/dL — ABNORMAL HIGH (ref 70–99)
Glucose-Capillary: 106 mg/dL — ABNORMAL HIGH (ref 70–99)
Glucose-Capillary: 121 mg/dL — ABNORMAL HIGH (ref 70–99)
Glucose-Capillary: 107 mg/dL — ABNORMAL HIGH (ref 70–99)

## 2021-09-23 LAB — CBC WITH DIFFERENTIAL/PLATELET
Abs Immature Granulocytes: 0.09 10*3/uL — ABNORMAL HIGH (ref 0.00–0.07)
Basophils Absolute: 0.1 10*3/uL (ref 0.0–0.1)
Basophils Relative: 0 %
Eosinophils Absolute: 0.4 10*3/uL (ref 0.0–0.5)
Eosinophils Relative: 3 %
HCT: 23 % — ABNORMAL LOW (ref 39.0–52.0)
Hemoglobin: 8.1 g/dL — ABNORMAL LOW (ref 13.0–17.0)
Immature Granulocytes: 1 %
Lymphocytes Relative: 4 %
Lymphs Abs: 0.5 10*3/uL — ABNORMAL LOW (ref 0.7–4.0)
MCH: 29.5 pg (ref 26.0–34.0)
MCHC: 35.2 g/dL (ref 30.0–36.0)
MCV: 83.6 fL (ref 80.0–100.0)
Monocytes Absolute: 1.3 10*3/uL — ABNORMAL HIGH (ref 0.1–1.0)
Monocytes Relative: 10 %
Neutro Abs: 10.7 10*3/uL — ABNORMAL HIGH (ref 1.7–7.7)
Neutrophils Relative %: 82 %
Platelets: 345 10*3/uL (ref 150–400)
RBC: 2.75 MIL/uL — ABNORMAL LOW (ref 4.22–5.81)
RDW: 17.2 % — ABNORMAL HIGH (ref 11.5–15.5)
WBC: 13.1 10*3/uL — ABNORMAL HIGH (ref 4.0–10.5)
nRBC: 0 % (ref 0.0–0.2)

## 2021-09-23 LAB — RENAL FUNCTION PANEL
Albumin: 2 g/dL — ABNORMAL LOW (ref 3.5–5.0)
Anion gap: 12 (ref 5–15)
BUN: 52 mg/dL — ABNORMAL HIGH (ref 8–23)
CO2: 22 mmol/L (ref 22–32)
Calcium: 8 mg/dL — ABNORMAL LOW (ref 8.9–10.3)
Chloride: 98 mmol/L (ref 98–111)
Creatinine, Ser: 4.43 mg/dL — ABNORMAL HIGH (ref 0.61–1.24)
GFR, Estimated: 14 mL/min — ABNORMAL LOW (ref >60)
Glucose, Bld: 164 mg/dL — ABNORMAL HIGH (ref 70–99)
Phosphorus: 4.3 mg/dL (ref 2.5–4.6)
Potassium: 4 mmol/L (ref 3.5–5.1)
Sodium: 132 mmol/L — ABNORMAL LOW (ref 135–145)

## 2021-09-23 LAB — POCT I-STAT 7, (LYTES, BLD GAS, ICA,H+H)
Acid-base deficit: 2 mmol/L (ref 0.0–2.0)
Bicarbonate: 21.8 mmol/L (ref 20.0–28.0)
Calcium, Ion: 1.16 mmol/L (ref 1.15–1.40)
HCT: 42 % (ref 39.0–52.0)
Hemoglobin: 14.3 g/dL (ref 13.0–17.0)
O2 Saturation: 96 %
Patient temperature: 36.5
Potassium: 3.9 mmol/L (ref 3.5–5.1)
Sodium: 134 mmol/L — ABNORMAL LOW (ref 135–145)
TCO2: 23 mmol/L (ref 22–32)
pCO2 arterial: 32.4 mmHg (ref 32.0–48.0)
pH, Arterial: 7.433 (ref 7.350–7.450)
pO2, Arterial: 79 mmHg — ABNORMAL LOW (ref 83.0–108.0)

## 2021-09-23 LAB — COOXEMETRY PANEL
Carboxyhemoglobin: 1.4 % (ref 0.5–1.5)
Methemoglobin: 0.9 % (ref 0.0–1.5)
O2 Saturation: 64.5 %
Total hemoglobin: 6.8 g/dL — CL (ref 12.0–16.0)

## 2021-09-23 LAB — BASIC METABOLIC PANEL
Anion gap: 12 (ref 5–15)
BUN: 60 mg/dL — ABNORMAL HIGH (ref 8–23)
CO2: 21 mmol/L — ABNORMAL LOW (ref 22–32)
Calcium: 8.2 mg/dL — ABNORMAL LOW (ref 8.9–10.3)
Chloride: 97 mmol/L — ABNORMAL LOW (ref 98–111)
Creatinine, Ser: 5.24 mg/dL — ABNORMAL HIGH (ref 0.61–1.24)
GFR, Estimated: 11 mL/min — ABNORMAL LOW (ref >60)
Glucose, Bld: 79 mg/dL (ref 70–99)
Potassium: 4 mmol/L (ref 3.5–5.1)
Sodium: 130 mmol/L — ABNORMAL LOW (ref 135–145)

## 2021-09-23 LAB — PROTIME-INR
INR: 2.8 — ABNORMAL HIGH (ref 0.8–1.2)
Prothrombin Time: 29.8 seconds — ABNORMAL HIGH (ref 11.4–15.2)

## 2021-09-23 LAB — ECHOCARDIOGRAM COMPLETE
Height: 69 in
S' Lateral: 3.9 cm
Weight: 3104.08 oz

## 2021-09-23 LAB — LACTATE DEHYDROGENASE: LDH: 370 U/L — ABNORMAL HIGH (ref 98–192)

## 2021-09-23 LAB — HEPARIN LEVEL (UNFRACTIONATED): Heparin Unfractionated: <0.1 IU/mL — ABNORMAL LOW (ref 0.30–0.70)

## 2021-09-23 LAB — PHOSPHORUS: Phosphorus: 5.2 mg/dL — ABNORMAL HIGH (ref 2.5–4.6)

## 2021-09-23 LAB — MAGNESIUM: Magnesium: 2.6 mg/dL — ABNORMAL HIGH (ref 1.7–2.4)

## 2021-09-23 MED ORDER — PRISMASOL BGK 4/2.5 32-4-2.5 MEQ/L EC SOLN: Status: DC

## 2021-09-23 MED ORDER — FUROSEMIDE 10 MG/ML IJ SOLN
120.0000 mg | Freq: Once | INTRAVENOUS | Status: AC
  Administered 2021-09-23: 120 mg via INTRAVENOUS
  Filled 2021-09-23: qty 12

## 2021-09-23 MED ORDER — ASPIRIN 81 MG PO CHEW
81.0000 mg | CHEWABLE_TABLET | Freq: Every day | ORAL | Status: DC
  Administered 2021-09-23 – 2021-10-07 (×15): 81 mg via ORAL
  Filled 2021-09-23 (×15): qty 1

## 2021-09-23 MED ORDER — HEPARIN (PORCINE) 2000 UNITS/L FOR CRRT
INTRAVENOUS_CENTRAL | Status: DC | PRN
  Administered 2021-10-07: 999 mL via INTRAVENOUS_CENTRAL

## 2021-09-23 MED ORDER — POLYETHYLENE GLYCOL 3350 17 G PO PACK
17.0000 g | PACK | Freq: Every day | ORAL | Status: DC
  Administered 2021-09-23 – 2021-09-27 (×5): 17 g via ORAL
  Filled 2021-09-23 (×5): qty 1

## 2021-09-23 MED ORDER — PRISMASOL BGK 4/2.5 32-4-2.5 MEQ/L REPLACEMENT SOLN: Status: DC

## 2021-09-23 MED ORDER — AMIODARONE HCL IN DEXTROSE 360-4.14 MG/200ML-% IV SOLN
30.0000 mg/h | INTRAVENOUS | Status: DC
  Administered 2021-09-23 – 2021-09-30 (×14): 30 mg/h via INTRAVENOUS
  Filled 2021-09-23 (×15): qty 200

## 2021-09-23 MED ORDER — AMIODARONE HCL IN DEXTROSE 360-4.14 MG/200ML-% IV SOLN: 30.0000 mg/h | INTRAVENOUS | Status: DC

## 2021-09-23 MED ORDER — SORBITOL 70 % SOLN
45.0000 mL | Freq: Once | Status: AC
  Administered 2021-09-23: 45 mL via ORAL
  Filled 2021-09-23: qty 60

## 2021-09-23 MED ORDER — HEPARIN SODIUM (PORCINE) 1000 UNIT/ML DIALYSIS
1000.0000 [IU] | INTRAMUSCULAR | Status: DC | PRN
  Administered 2021-09-24: 3000 [IU] via INTRAVENOUS_CENTRAL
  Administered 2021-09-25: 2800 [IU] via INTRAVENOUS_CENTRAL
  Administered 2021-09-27: 4000 [IU] via INTRAVENOUS_CENTRAL
  Administered 2021-09-30: 3000 [IU] via INTRAVENOUS_CENTRAL
  Administered 2021-10-07 – 2021-10-09 (×2): 4000 [IU] via INTRAVENOUS_CENTRAL
  Administered 2021-10-10: 4 [IU] via INTRAVENOUS_CENTRAL
  Filled 2021-09-23: qty 6
  Filled 2021-09-23: qty 3
  Filled 2021-09-23 (×5): qty 6
  Filled 2021-09-23: qty 3
  Filled 2021-09-23: qty 4
  Filled 2021-09-23 (×2): qty 6
  Filled 2021-09-23: qty 1
  Filled 2021-09-23: qty 6
  Filled 2021-09-23: qty 4
  Filled 2021-09-23: qty 2
  Filled 2021-09-23: qty 4
  Filled 2021-09-23 (×2): qty 6
  Filled 2021-09-23: qty 3

## 2021-09-23 NOTE — Progress Notes (Addendum)
[name] Progress Note   71 y.o. 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 Impella was placed on 09/12/2021 and then HM3 on 12/28. CPB time was 161 minutes.  Creatinine was 2.02 12/09/2015 and BL ~1.6-2.    Assessment/ Plan:   Acute kidney injury on CKD3b with a baseline cr in the 1.6-2 range. Acute component either from cardiorenal +/- ATN with decreased renal function from hemodynamics and CPB + hypotensive episodes + pressors. No absolute indication for RRT but he is heading in that direction which I di/w the spouse who was bedside. Weights have increased from 78.4kg to 86.3kg which is c/w worsening edema. CVP ~12. - Agree with restarting  the Lasix given significant drop in UOP and worsening dyspnea.  I personally reviewed the urine microscopy and c/w ATN.  - Will f/u later today and if poor response to the Lasix he will need to be started on CRRT; he will not tolerate iHD. D/w patient and he expressed understanding.  -Cont monitoring daily I/Os + weights  -Maintain MAP>65 for optimal renal perfusion.  -Avoid nephrotoxic medications including NSAIDs, contrast. - Dose medications for a GFR <15 ml/min.   HFrEF with underlying CM s/p HM3 with CVP in the 12 range with worsening edema despite Lasix but unfortunately renal function worsening. On Milrinone, epinephrine + Levophed.  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  Anemia Afib  Subjective:   CVP 12-13 with Co-ox 65% with worsening dyspnea.   Objective:   BP 94/71    Pulse (!) 153    Temp 97.7 F (36.5 C)    Resp 20    Ht 5' 9" (1.753 m)    Wt 88 kg    SpO2 94%    BMI 28.65 kg/m   Intake/Output Summary (Last 24 hours) at 09/23/2021 0817 Last data filed at 09/23/2021 0700 Gross per 24 hour  Intake 943.77 ml  Output 180 ml  Net  763.77 ml   Weight change: 1.7 kg  Physical Exam: GEN: NAD, A&Ox3, NCAT HEENT: No conjunctival pallor, EOMI NECK: Supple, JVD present LUNGS: CTA B/L no rales, rhonchi or wheezing CV: LVAD hum ABD: SNDNT +BS  EXT: 1+ lower extremity edema and also present in the UE   Imaging: DG CHEST PORT 1 VIEW  Result Date: 09/21/2021 CLINICAL DATA:  Evaluate for pneumonia EXAM: PORTABLE CHEST 1 VIEW COMPARISON:  09/20/2021 FINDINGS: Right arm PICC line tip terminates over the distal SVC. There is a left IJ Cordis with tip terminating in the distal left internal jugular vein. Left chest wall ICD is noted with leads in the right atrial appendage and right ventricle. LVAD device partially imaged projecting over the left cardiac apex. Status post median sternotomy. Bilateral chest tubes are in place. No pneumothorax visualized. Small pleural effusion is identified on the right. Pulmonary vascular congestion appears unchanged. IMPRESSION: 1. Stable support apparatus. 2. Small right pleural effusion with pulmonary vascular congestion suggestive of mild CHF. Electronically Signed   By: Kerby Moors M.D.   On: 09/21/2021 10:07    Labs: [employer] Recent [employer] 09/18/21 0407 09/18/21 0451 09/19/21 0358 09/19/21 1524 09/19/21 1708 09/19/21 2146 09/20/21 0251 09/20/21 1946 09/21/21 0344 09/21/21 0356 09/22/21 0406 09/22/21 0431 09/22/21 1444 09/23/21 0350 09/23/21 0355  NA 130*   < >  132*   < > 132*   < > 127* 133* 131* 133* 130* 132* 131* 130* 134*  K 4.2   < > 4.3   < > 4.2   < > 3.7 4.3 4.1 4.0 4.2 4.2 4.0 4.0 3.9  CL 98   < > 101  --  98  --  93* 99 96*  --  97*  --  96* 97*  --   CO2 21*   < > 23  --  24  --  _0 --  22  --  22 21*  --   GLUCOSE 118*   < > 127*  --  57*  --  357* 77 67*  --  112*  --  125* 79  --   BUN 31*   < > 36*  --  38*  --  38* 40* 45*  --  50*  --  55* 60*  --   CREATININE 2.11*   < > 2.39*  --  2.51*  --  2.61* 3.13* 3.33*  --  4.27*  --  4.62* 5.24*  --    CALCIUM 8.0*   < > 7.9*  --  8.1*  --  7.9* 8.1* 8.3*  --  8.0*  --  8.3* 8.2*  --   PHOS 3.3  --  4.6  --   --   --  4.1  --  4.6  --  4.5  --   --  5.2*  --    < > = values in this interval not displayed.   CBC Recent Labs  Lab 09/20/21 0251 09/20/21 1709 09/21/21 0344 09/21/21 0356 09/22/21 0406 09/22/21 0431 09/23/21 0350 09/23/21 0355  WBC 16.7* 17.7* 16.6*  --  14.7*  --  13.1*  --   NEUTROABS 14.4*  --  14.6*  --  12.4*  --  10.7*  --   HGB 7.9* 8.0* 8.3*   < > 8.3* 8.2* 8.1* 14.3  HCT 21.5* 22.2* 22.8*   < > 23.4* 24.0* 23.0* 42.0  MCV 85.0 84.7 83.2  --  84.2  --  83.6  --   PLT 121* 152 185  --  248  --  345  --    < > = values in this interval not displayed.    Medications:     sodium chloride   Intravenous Once   sodium chloride   Intravenous Once   aspirin  81 mg Oral Daily   bisacodyl  10 mg Oral Daily   Or   bisacodyl  10 mg Rectal Daily   chlorhexidine gluconate (MEDLINE KIT)  15 mL Mouth Rinse BID   Chlorhexidine Gluconate Cloth  6 each Topical Daily   docusate sodium  200 mg Oral Daily   feeding supplement  237 mL Oral TID BM   insulin aspart  0-15 Units Subcutaneous Q4H   mouth rinse  15 mL Mouth Rinse BID   montelukast  5 mg Oral QHS   multivitamin with minerals  1 tablet Oral Daily   pantoprazole  40 mg Oral Daily   polyethylene glycol  17 g Oral Daily   rosuvastatin  10 mg Oral Daily   sodium chloride flush  10-40 mL Intracatheter Q12H   sodium chloride flush  10-40 mL Intracatheter Q12H   sodium chloride flush  3 mL Intravenous Q12H   sorbitol  45 mL Oral Once   Warfarin - Pharmacist Dosing Inpatient   Does not apply  [place], MD 09/23/2021, 8:17 AM

## 2021-09-23 NOTE — Progress Notes (Signed)
Only 25 cc UOP after 120 mg lasix IV around 8:30 am.   Scr up to 5.24 (1.7 pre-op).  Reviewed with Dr. Aundra Dubin and Nephrology.  Planning to initiate CRRT.  Patient agreeable.   Discussed placement of HD catheter with CCM. Appreciate assistance.

## 2021-09-23 NOTE — Progress Notes (Signed)
Echo attempted. Procedure in progress. Will attempt again as schedule permits.

## 2021-09-23 NOTE — Progress Notes (Signed)
Patient ID: Isaac [name], male   DOB: [birth year], 71 y.o.   MRN: [id number]  HeartMate 3 Rounding Note  Subjective:    No specific complaints. Had femoral catheter inserted by CCM this morning to start CRRT since not much response to lasix with marked volume overload.  On milrinone 0.25, epi 6, NE 10 with Co-ox 64.5% this am.   LVAD INTERROGATION:  HeartMate IIl LVAD:  Flow 4.8 liters/min, speed 5500, power 4.1, PI 3.3.    Objective:    Vital Signs:   Temp:  [97.7 F (36.5 C)-98.5 F (36.9 C)] 98.3 F (36.8 C) (01/03 0800) Pulse Rate:  [30-187] 133 (01/03 1000) Resp:  [11-28] 15 (01/03 1000) BP: (82-108)/(64-89) 91/77 (01/03 1000) SpO2:  [88 %-100 %] 95 % (01/03 1000) Arterial Line BP: (91-101)/(59-78) 99/78 (01/03 1000) Weight:  [88 kg] 88 kg (01/03 0500) Last BM Date: 09/23/21 Mean arterial Pressure 80's  Intake/Output:   Intake/Output Summary (Last 24 hours) at 09/23/2021 1257 Last data filed at 09/23/2021 1000 Gross per 24 hour  Intake 1002.27 ml  Output 200 ml  Net 802.27 ml     Physical Exam: General:  anasarca. HEENT: normal Cor: Distant heart sounds with LVAD hum present. Lungs: clear Abdomen: soft, nontender, nondistended. Good bowel sounds. Extremities: warm, marked edema Neuro: alert & orientedx3, cranial nerves grossly intact. moves all 4 extremities w/o difficulty. Affect pleasant  Telemetry: atrial fib 100.  Labs: Basic Metabolic Panel: Recent Labs  Lab 09/19/21 0358 09/19/21 1524 09/20/21 0251 09/20/21 1946 09/21/21 0344 09/21/21 0356 09/22/21 0406 09/22/21 0431 09/22/21 1444 09/23/21 0350 09/23/21 0355  NA 132*   < > 127* 133* 131*   < > 130* 132* 131* 130* 134*  K 4.3   < > 3.7 4.3 4.1   < > 4.2 4.2 4.0 4.0 3.9  CL 101   < > 93* 99 96*  --  97*  --  96* 97*  --   CO2 23   < > 23 25 23   --  22  --  22 21*  --   GLUCOSE 127*   < > 357* 77 67*  --  112*  --  125* 79  --   BUN 36*   < > 38* 40* 45*  --  50*  --  55* 60*  --    CREATININE 2.39*   < > 2.61* 3.13* 3.33*  --  4.27*  --  4.62* 5.24*  --   CALCIUM 7.9*   < > 7.9* 8.1* 8.3*  --  8.0*  --  8.3* 8.2*  --   MG 2.2  --  2.2  --  2.3  --  2.3  --   --  2.6*  --   PHOS 4.6  --  4.1  --  4.6  --  4.5  --   --  5.2*  --    < > = values in this interval not displayed.    Liver Function Tests: Recent Labs  Lab 09/18/21 0407 09/19/21 0358 09/20/21 0251  AST 122* 113* 86*  ALT 26 9 5   ALKPHOS 49 67 74  BILITOT 3.1* 4.3* 4.7*  PROT 4.7* 4.5* 4.8*  ALBUMIN 2.6* 2.3* 2.3*   No results for input(s): LIPASE, AMYLASE in the last 168 hours. Recent Labs  Lab 09/20/21 0701  AMMONIA <10    CBC: Recent Labs  Lab 09/19/21 0358 09/19/21 1042 09/20/21 0251 09/20/21 1709 09/21/21 0344 09/21/21 0356 09/22/21 0406 09/22/21 0431 09/23/21  0350 09/23/21 0355  WBC 13.6*   < > 16.7* 17.7* 16.6*  --  14.7*  --  13.1*  --   NEUTROABS 11.4*  --  14.4*  --  14.6*  --  12.4*  --  10.7*  --   HGB 7.5*   < > 7.9* 8.0* 8.3* 7.8* 8.3* 8.2* 8.1* 14.3  HCT 21.2*   < > 21.5* 22.2* 22.8* 23.0* 23.4* 24.0* 23.0* 42.0  MCV 84.8   < > 85.0 84.7 83.2  --  84.2  --  83.6  --   PLT 116*   < > 121* 152 185  --  248  --  345  --    < > = values in this interval not displayed.    INR: Recent Labs  Lab 09/19/21 0358 09/20/21 0251 09/21/21 0344 09/22/21 0406 09/23/21 0350  INR 1.8* 1.5* 1.4* 1.9* 2.8*    Other results: EKG:   Imaging: No results found.   Medications:     Scheduled Medications:  sodium chloride   Intravenous Once   sodium chloride   Intravenous Once   aspirin  81 mg Oral Daily   bisacodyl  10 mg Oral Daily   Or   bisacodyl  10 mg Rectal Daily   chlorhexidine gluconate (MEDLINE KIT)  15 mL Mouth Rinse BID   Chlorhexidine Gluconate Cloth  6 each Topical Daily   docusate sodium  200 mg Oral Daily   feeding supplement  237 mL Oral TID BM   insulin aspart  0-15 Units Subcutaneous Q4H   mouth rinse  15 mL Mouth Rinse BID   montelukast  5 mg  Oral QHS   multivitamin with minerals  1 tablet Oral Daily   pantoprazole  40 mg Oral Daily   polyethylene glycol  17 g Oral Daily   rosuvastatin  10 mg Oral Daily   sodium chloride flush  10-40 mL Intracatheter Q12H   sodium chloride flush  10-40 mL Intracatheter Q12H   sodium chloride flush  3 mL Intravenous Q12H   Warfarin - Pharmacist Dosing Inpatient   Does not apply q1600    Infusions:   prismasol BGK 4/2.5 400 mL/hr at 09/23/21 1221    prismasol BGK 4/2.5 200 mL/hr at 09/23/21 1221   sodium chloride Stopped (09/20/21 0304)   sodium chloride Stopped (09/18/21 0803)   sodium chloride 20 mL/hr at 09/17/21 1530   amiodarone 30 mg/hr (09/23/21 1000)   epinephrine 6 mcg/min (09/23/21 1000)   lactated ringers     lactated ringers 20 mL/hr at 09/20/21 2027   milrinone 0.25 mcg/kg/min (09/23/21 1000)   norepinephrine (LEVOPHED) Adult infusion 10 mcg/min (09/23/21 1000)   prismasol BGK 4/2.5 1,800 mL/hr at 09/23/21 1222    PRN Medications: sodium chloride, albuterol, heparin, heparin, lip balm, melatonin, morphine injection, ondansetron (ZOFRAN) IV, oxyCODONE, polyethylene glycol, sodium chloride flush, sodium chloride flush, sodium chloride flush, sorbitol, traMADol   Assessment:   POD 6 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 Moderate RV systolic dysfunction by echo preop but looked better after LVAD insertion in OR. 2.   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 Decided against removal of ICD/pacer and TV replacement. Improved a little at the end of surgery. 3.  Hx of atrial fib on amio. Has been in and out postop. 4.   Stage 3 CKD with progressive  rise in creat up to 5.24 this am with oliguria, marked volume overload and no response to lasix.   Preop was as low as 1.49 and then increased to 1.7 before surgery after large  diuresis the day before. CRRT started this am for volume removal. 5.   CAD with PCI of OM1 in 2007 and RCA in 2013. 6.   Acute postop blood loss anemia. stable.  Plan/Discussion:    Hemodynamics fairly stable. Wean NE as tolerated. Maintain MAP of 80 for kidney perfusion. 2D echo done today and result pending.   CRRT for volume removal.  INR jumped to 2.8 after 2.5 and 2.0 mg Coumadin. No Coumadin tonight.  Will remove pacing wires when INR comes back down.  Unfortunately he now has a femoral HD catheter which will limit mobilization. Need to keep off back to avoid pressure sores.  Encourage nutrition.   I reviewed the LVAD parameters from today, and compared the results to the patient's prior recorded data.  No programming changes were made.  The LVAD is functioning within specified parameters.  The patient performs LVAD self-test daily.  LVAD interrogation was negative for any significant power changes, alarms or PI events/speed drops.   Length of Stay: Clever 09/23/2021, 12:57 PM

## 2021-09-23 NOTE — Progress Notes (Signed)
°  Echocardiogram 2D Echocardiogram has been performed.  [name] 09/23/2021, 12:50 PM

## 2021-09-23 NOTE — Progress Notes (Signed)
Orthopedic Tech Progress Note Patient Details:  Isaac [name] [birth year] [id number]  Ortho Devices Type of Ortho Device: Louretta Parma boot Ortho Device/Splint Location: BLE Ortho Device/Splint Interventions: Ordered, Application, Adjustment   Post Interventions Patient Tolerated: Well Instructions Provided: Care of device  Janit Pagan 09/23/2021, 11:18 AM

## 2021-09-23 NOTE — Progress Notes (Signed)
Patient ID: Isaac [name], male   DOB: 10-Apr-1951, 71 y.o.   MRN: [id number]    Advanced Heart Failure VAD Team Note  PCP-Cardiologist: None   Subjective:    12/19 RHC- RA 7, PA 39/14 (25), PCWP 11, CO 6.4 CI 3. Thermo 3.5 1.8. Lasix drip stopped after cath. 12/20 Swan removed.  Norepi 3 mcg added.  12/21 Fever. Blood CX drawn. UA + leukocytes. Started on vanc and cefepime. Diuresed with IV lasix + metolazone.  12/23 Impella 5.5 placed 12/28 HM III LVAD, ICD leads plastered to tricuspid valve with severe TR, valve not replaced and some improvement in TR with LVAD placement.  12/29 Extubated  On milrinone 0.25, NE 10, epinephrine 6.  MAP 80s.  Co-ox 64.5%.   Creatinine higher at 2.39 => 2.61 => 3.1 => 3.33 => 4.27 => 5.24. UOP 240 cc, no Lasix given.  CVP 13, patient edematous with rising weight.   Afebrile, WBCs 17.7 => 16.6 => 14.7  => 13.1  Alert/oriented, walked yesterday with PT in hall.   This morning, he appears to be in atrial fibrillation rate generally 100s.   LVAD INTERROGATION:  HeartMate III LVAD:   Flow 4.8 liters/min, speed 5500, power 4.1, PI 3.3.   LDH 344 => 379 => 376 => 379 => 356 => 370 On low dose heparin gtt INR 2.8  Plts 113 => 121 => 185 => 248 => 345 Hgb 7.9 => 8.3 => 8.3 => 8.1  Objective:    Vital Signs:   Temp:  [97.7 F (36.5 C)-98.5 F (36.9 C)] 97.7 F (36.5 C) (01/03 0545) Pulse Rate:  [30-187] 153 (01/03 0700) Resp:  [11-28] 20 (01/03 0700) BP: (82-112)/(64-89) 94/71 (01/03 0700) SpO2:  [88 %-100 %] 94 % (01/03 0700) Arterial Line BP: (91-97)/(59-61) 95/59 (01/03 0700) Weight:  [88 kg] 88 kg (01/03 0500) Last BM Date: 09/10/21 Mean arterial Pressure  78-80 (doppler)  Intake/Output:   Intake/Output Summary (Last 24 hours) at 09/23/2021 0743 Last data filed at 09/23/2021 0700 Gross per 24 hour  Intake 996.39 ml  Output 240 ml  Net 756.39 ml     Physical Exam    General: Well appearing this am. NAD.  HEENT: Normal. Neck:  Supple, JVP 14 cm cm. Carotids OK.  Cardiac:  Mechanical heart sounds with LVAD hum present.  Lungs:  CTAB, normal effort.  Abdomen:  NT, ND, no HSM. No bruits or masses. +BS  LVAD exit site: Well-healed and incorporated. Dressing dry and intact. No erythema or drainage. Stabilization device present and accurately applied. Driveline dressing changed daily per sterile technique. Extremities:  Warm and dry. No cyanosis, clubbing, rash. 1+ edema to knees and elbows.  Neuro:  Alert & oriented x 3. Cranial nerves grossly intact. Moves all 4 extremities w/o difficulty. Affect pleasant      Telemetry   Atrial fibrillation rate 100s. Personally reviewed.    Labs   Basic Metabolic Panel: Recent Labs  Lab 09/19/21 0358 09/19/21 1524 09/20/21 0251 09/20/21 1946 09/21/21 0344 09/21/21 0356 09/22/21 0406 09/22/21 0431 09/22/21 1444 09/23/21 0350 09/23/21 0355  NA 132*   < > 127* 133* 131*   < > 130* 132* 131* 130* 134*  K 4.3   < > 3.7 4.3 4.1   < > 4.2 4.2 4.0 4.0 3.9  CL 101   < > 93* 99 96*  --  97*  --  96* 97*  --   CO2 23   < > _0 --  22  --  22 21*  --   GLUCOSE 127*   < > 357* 77 67*  --  112*  --  125* 79  --   BUN 36*   < > 38* 40* 45*  --  50*  --  55* 60*  --   CREATININE 2.39*   < > 2.61* 3.13* 3.33*  --  4.27*  --  4.62* 5.24*  --   CALCIUM 7.9*   < > 7.9* 8.1* 8.3*  --  8.0*  --  8.3* 8.2*  --   MG 2.2  --  2.2  --  2.3  --  2.3  --   --  2.6*  --   PHOS 4.6  --  4.1  --  4.6  --  4.5  --   --  5.2*  --    < > = values in this interval not displayed.    Liver Function Tests: Recent Labs  Lab 09/18/21 0407 09/19/21 0358 09/20/21 0251  AST 122* 113* 86*  ALT _0 ALKPHOS 49 67 74  BILITOT 3.1* 4.3* 4.7*  PROT 4.7* 4.5* 4.8*  ALBUMIN 2.6* 2.3* 2.3*   No results for input(s): LIPASE, AMYLASE in the last 168 hours. Recent Labs  Lab 09/20/21 0701  AMMONIA <10    CBC: Recent Labs  Lab 09/19/21 0358 09/19/21 1042 09/20/21 0251 09/20/21 1709  09/21/21 0344 09/21/21 0356 09/22/21 0406 09/22/21 0431 09/23/21 0350 09/23/21 0355  WBC 13.6*   < > 16.7* 17.7* 16.6*  --  14.7*  --  13.1*  --   NEUTROABS 11.4*  --  14.4*  --  14.6*  --  12.4*  --  10.7*  --   HGB 7.5*   < > 7.9* 8.0* 8.3* 7.8* 8.3* 8.2* 8.1* 14.3  HCT 21.2*   < > 21.5* 22.2* 22.8* 23.0* 23.4* 24.0* 23.0* 42.0  MCV 84.8   < > 85.0 84.7 83.2  --  84.2  --  83.6  --   PLT 116*   < > 121* 152 185  --  248  --  345  --    < > = values in this interval not displayed.    INR: Recent Labs  Lab 09/19/21 0358 09/20/21 0251 09/21/21 0344 09/22/21 0406 09/23/21 0350  INR 1.8* 1.5* 1.4* 1.9* 2.8*    Other results: EKG:    Imaging   DG CHEST PORT 1 VIEW  Result Date: 09/21/2021 CLINICAL DATA:  Evaluate for pneumonia EXAM: PORTABLE CHEST 1 VIEW COMPARISON:  09/20/2021 FINDINGS: Right arm PICC line tip terminates over the distal SVC. There is a left IJ Cordis with tip terminating in the distal left internal jugular vein. Left chest wall ICD is noted with leads in the right atrial appendage and right ventricle. LVAD device partially imaged projecting over the left cardiac apex. Status post median sternotomy. Bilateral chest tubes are in place. No pneumothorax visualized. Small pleural effusion is identified on the right. Pulmonary vascular congestion appears unchanged. IMPRESSION: 1. Stable support apparatus. 2. Small right pleural effusion with pulmonary vascular congestion suggestive of mild CHF. Electronically Signed   By: Kerby Moors M.D.   On: 09/21/2021 10:07     Medications:     Scheduled Medications:  sodium chloride   Intravenous Once   sodium chloride   Intravenous Once   aspirin  81 mg Oral Daily   bisacodyl  10 mg Oral Daily   Or   bisacodyl  10 mg Rectal Daily   chlorhexidine gluconate (MEDLINE KIT)  15 mL Mouth Rinse BID   Chlorhexidine Gluconate Cloth  6 each Topical Daily   docusate sodium  200 mg Oral Daily   feeding supplement  237 mL Oral  TID BM   insulin aspart  0-15 Units Subcutaneous Q4H   mouth rinse  15 mL Mouth Rinse BID   montelukast  5 mg Oral QHS   multivitamin with minerals  1 tablet Oral Daily   pantoprazole  40 mg Oral Daily   rosuvastatin  10 mg Oral Daily   sodium chloride flush  10-40 mL Intracatheter Q12H   sodium chloride flush  10-40 mL Intracatheter Q12H   sodium chloride flush  3 mL Intravenous Q12H   Warfarin - Pharmacist Dosing Inpatient   Does not apply q1600    Infusions:  sodium chloride Stopped (09/20/21 0304)   sodium chloride Stopped (09/18/21 0803)   sodium chloride 20 mL/hr at 09/17/21 1530   amiodarone     epinephrine 6 mcg/min (09/23/21 0600)   furosemide     lactated ringers     lactated ringers 20 mL/hr at 09/20/21 2027   milrinone 0.25 mcg/kg/min (09/23/21 0600)   norepinephrine (LEVOPHED) Adult infusion 12 mcg/min (09/23/21 0600)    PRN Medications: sodium chloride, albuterol, lip balm, melatonin, morphine injection, ondansetron (ZOFRAN) IV, oxyCODONE, polyethylene glycol, sodium chloride flush, sodium chloride flush, sodium chloride flush, sorbitol, traMADol   Assessment/Plan:    1. Acute on chronic systolic CHF:  Long-standing cardiomyopathy.  [place].  Echo this admission with EF < 20%, severe LV dilation, restrictive diastolic function, moderate RV dysfunction, moderate MR, mod-severe TR. Cause of cardiomyopathy is uncertain.  He has a history of CAD, but I do not think that the described CAD from the past could explain his cardiomyopathy, but CAD could have progressed.  With difficulty tolerating GDMT/need for midodrine and cardiorenal syndrome as well as profound volume overload + NYHA class IV symptoms,  concerned for low output HF. Co-ox off milrinone was 36%, milrinone started and increased to 0.375 but CO remained low. NE added and Impella 5.5 placed 12/23. EF 10% on TEE 12/23. s/p HM III VAD on 12/28.  Currently on NE 10, epinephrine 6, milrinone 0.25.  Co-ox  64.5%.  LVAD parameters stable. Creatinine continues to trend higher 3.33 => 4.27 => 5.24.  CVP elevated at 13 and edematous, only 240 cc UOP (no Lasix).  Weight rising. MAP stable in lower 80s. LDH stable.  - OK to wean NE today with stable MAP. Slowly/carefully to maintain renal perfusion with AKI on CKD.  - Continue milrinone, epinephrine.   - Will plan echo today to assess LVAD, RV function with ongoing creatinine rise.  - Lasix 120 mg IV x 1.  If no response, I am afraid he is going to need CVVH.  Nephrology following.  - INR 2.8, stop heparin gtt and decrease ASA to 81 daily.   - Place [place] boots to lower legs.  2. Tricuspid regurgitation: Has moderate-severe TR.  Tricuspid repair not done at time of VAD d/t proximity of ICD wires 3. Atrial fibrillation: Persistent since 10/22 based on device interrogation.  Was in junctional rhythm yesterday, now looks like he is back in AF with HR 100s.   - Can resume amiodarone gtt (has been on po).  4. AKI on CKD stage 3: Creatinine 2.61 => 3.1 => 3.33 => 4.27 => 5.24.  Gradual rise (was about 1.7 pre-op) since  surgery.  He has not been hypotensive post-op, ?intra-op hypotension with development of ATN.  - Maintain MAP and CO.    - Weight rising and volume overloaded, will give Lasix 120 mg IV x 1 now.  If no response, I think he will need CVVH. Possible HD catheter need with CCM.  - Nephrology following.   5. CAD: History of PCI to OM1 in 2007 and RCA in 2013.  No chest pain or ACS.  - Deferred cardiac cath due to AKI and plan for VAD - Continue Crestor.  6. ID: Had fever 12/21, PCT 1.03.  Has been afebrile since.  Blood cultures negative. ?Phlebitis at IV site. Completed course of abx. WBCs elevated to  17.7 => 16.6 => 14.7 => 13.1 post-op but no fever or evidence for infection. CXR with pulmonary vascular congestion but no PNA.   7. Constipation: Continue aggressive bowel regimen.  8. Hyponatremia: Hypervolemic hyponatremia.  Na 130 today.    9.  Anemia: Blood loss anemia, had hematoma at Impella 5.5 site with significant drainage after removal. Hgb 8.1 today.  -  transfuse hgb < 7.5 (would like to avoid extra volume if possible).  10. Confusion/delirium: Initially post-op and started on Precedex.  This has cleared, now up to chair and eating and has walked in hall.  - Encourage family at bedside.  11. F/E/N: Advance diet.   Up to chair, needs to walk in hall.   I reviewed the LVAD parameters from today, and compared the results to the patient's prior recorded data.  No programming changes were made.  The LVAD is functioning within specified parameters.  The patient performs LVAD self-test daily.  LVAD interrogation was negative for any significant power changes, alarms or PI events/speed drops.  LVAD equipment check completed and is in good working order.  Back-up equipment present.   LVAD education done on emergency procedures and precautions and reviewed exit site care.  CRITICAL CARE Performed by: Loralie Champagne  Total critical care time: 40 minutes  Critical care time was exclusive of separately billable procedures and treating other patients.  Critical care was necessary to treat or prevent imminent or life-threatening deterioration.  Critical care was time spent personally by me on the following activities: development of treatment plan with patient and/or surrogate as well as nursing, discussions with consultants, evaluation of patient's response to treatment, examination of patient, obtaining history from patient or surrogate, ordering and performing treatments and interventions, ordering and review of laboratory studies, ordering and review of radiographic studies, pulse oximetry and re-evaluation of patient's condition.  Loralie Champagne 09/23/2021 7:43 AM

## 2021-09-23 NOTE — Procedures (Signed)
Central Venous Catheter Insertion Procedure Note  Armanii Pressnell  [id number]  Sep 16, 1951  Date:09/23/21  Time:12:05 PM   Provider Performing:Brylan Dec E Glade Strausser   Procedure: Insertion of Non-tunneled Central Venous Catheter(36556)with US guidance (72158)    Indication(s) Hemodialysis  Consent Risks of the procedure as well as the alternatives and risks of each were explained to the patient and/or caregiver.  Consent for the procedure was obtained and is signed in the bedside chart  Anesthesia Topical only with 1% lidocaine   Timeout Verified patient identification, verified procedure, site/side was marked, verified correct patient position, special equipment/implants available, medications/allergies/relevant history reviewed, required imaging and test results available.  Sterile Technique Maximal sterile technique including full sterile barrier drape, hand hygiene, sterile gown, sterile gloves, mask, hair covering, sterile ultrasound probe cover (if used).  Procedure Description Area of catheter insertion was cleaned with chlorhexidine and draped in sterile fashion.   With real-time ultrasound guidance a HD catheter was placed into the right femoral vein.  Guidewire verified in correct vessel prior to dilation and insertion of catheter. Nonpulsatile blood flow and easy flushing noted in all ports.  The catheter was sutured in place and sterile dressing applied.  Complications/Tolerance None; patient tolerated the procedure well. Chest x-ray is not ordered for femoral cannulation.  EBL Minimal  Specimen(s) None    Eliseo Gum MSN, AGACNP-BC [place] for pager  09/23/2021, 12:05 PM

## 2021-09-23 NOTE — Progress Notes (Signed)
Drive Line: Existing VAD dressing removed and site care performed using sterile technique. Drive line exit site cleaned with Chlora prep applicators x 2, allowed to dry, and gauze dressing with silver strip applied. Scant amount of bloody drainage noted at site and on silver strip. No redness, tenderness, foul odor, or rash noted. Both drive line anchors changed, secure.   Daily dressing changes per Nurse Davonna Belling or [place] coordinator. Next dressing change due: 09/23/21.   Henreitta Leber, RN VAD Nurse Davonna Belling 09/22/2021 22:50

## 2021-09-23 NOTE — Progress Notes (Signed)
LVAD Coordinator Rounding Note:  Admitted 09/06/21 due to CHF exacerbation to Dr Claris Gladden service. He was started on Milrinone on admission. Febrile n 09/10/21- suspected to have UTI- antibiotics started. Impella 5.5 placed 09/12/21.   HM III LVAD implanted on 09/17/21 by Dr Cyndia Bent under destination therapy criteria. 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   Pt laying in bed this morning. Just walked with PT.  Creatinine 5.24. Only 25 cc UOP after 120 mg lasix IV around 8:30 am. Plan to place HD catheter today and start CVVH.  Pt now in afib on Amiodarone gtt.   No family at bedside.   Vital signs: Temp: 98.3 HR: 103 afib Doppler Pressure: 84 Radial Art Line: 99/78 (87) O2 Sat: 95% on 4L [place] Wt: 185.2>196.8>194 lbs    LVAD interrogation reveals:  Speed: 5500 Flow: 4.8 Power:  4w PI: 3.2  Hematocrit: 23  Alarms: none Events: 1 PI event so far today  Fixed speed: 5500 Low speed limit: 5200  Drive Line: Existing VAD dressing removed and site care performed using sterile technique. Drive line exit site cleaned with Chlora prep applicators x 2, allowed to dry, and gauze dressing with silver strip applied. Exit site not incorporated, the velour is fully implanted at exit site. 1 suture in place. Small amount of bloody drainage noted at site and on gauze. Slight redness, no tenderness, foul odor or rash noted. Drive line anchor secure x 2. Daily dressing changes per Nurse Davonna Belling or [place] coordinator. Next dressing change due: 09/24/20.      Labs:  LDH trend: 219>758>832  INR trend: 1.6>1.8>2.8  WBC trend: 14.1>13.6>13.1  Cr trend: 2.11>2.39>5.24  Hgb trend: 6.5>7.5>8.1  Anticoagulation Plan: -INR Goal: 2.0 - 2.5 -ASA Dose: 325 mg daily (until INR therapeutic)   Blood Products:  Intra Op: 09/17/21  1 PLT  4 FFP   4 PRBC  Cellsaver- 803 cc Post Op:  - 09/18/21>> 3 PRBCs - 09/19/21>> 1  PRBC  Device: - [employer] dual -Therapies: OFF  *Will need to be referred to device clinic at discharge*  Arrythmias: 3 episodes of VT that required defib while in OR 09/17/21. Currently on Amiodarone gtt. Hx: afib.   Respiratory: extubated 09/18/21  Infection:  09/10/21>> blood cultures >> No growth 5 days; final 09/12/21>> urine culture >> No growth; final   Drips:  Amiodarone 30 mg/hr Epinephrine 6 mcg/min Milrinone 0.25 mcg/kg/min Levophed 10 mcg/min Vasopressin 0.04-- off   Patient Education: Provided pt's wife with HM III Patient Manual today as requested. She plans to begin reviewing over the weekend.  Will plan to start dressing change education with pt's wife this week. She observed dressing change last week. No family at bedside today.  Plan/Recommendations:  1. [place] VAD coordinator for drive line or equipment concerns 2. Daily drive line dressing change per Nurse Davonna Belling or [place] coordinator  [name] RN [place] Coordinator  Office: [phone]  24/7 Pager: [phone]

## 2021-09-23 NOTE — Progress Notes (Signed)
[place] for Warfarin  Indication: LVAD/AF  Allergies  Allergen Reactions   Mushroom Extract Complex Nausea And Vomiting   Neosporin [Neomycin-Bacitracin Zn-Polymyx] Hives    Patient Measurements: Height: 5\' 9"  (175.3 cm) Weight: 88 kg (194 lb 0.1 oz) IBW/kg (Calculated) : 70.7 Heparin Dosing Weight: 83.7 kg  Vital Signs: Temp: 97.7 F (36.5 C) (01/03 0545) Temp Source: Core (01/03 0400) BP: 94/71 (01/03 0700) Pulse Rate: 153 (01/03 0700)  Labs: Recent Labs    09/21/21 0344 09/21/21 0356 09/22/21 0406 09/22/21 0431 09/22/21 1444 09/23/21 0350 09/23/21 0355  HGB 8.3*   < > 8.3* 8.2*  --  8.1* 14.3  HCT 22.8*   < > 23.4* 24.0*  --  23.0* 42.0  PLT 185  --  248  --   --  345  --   LABPROT 17.3*  --  21.9*  --   --  29.8*  --   INR 1.4*  --  1.9*  --   --  2.8*  --   HEPARINUNFRC <0.10*  --  <0.10*  --   --  <0.10*  --   CREATININE 3.33*  --  4.27*  --  4.62* 5.24*  --    < > = values in this interval not displayed.     Estimated Creatinine Clearance: 14.6 mL/min (A) (by C-G formula based on SCr of 5.24 mg/dL (H)).   Medical History: Past Medical History:  Diagnosis Date   Arrhythmia    atrial fibrillation   CHF (congestive heart failure) (HCC)    Chronic kidney disease    Coronary artery disease    Hyperlipidemia    Hypertension    Myocardial infarct [facility])     Assessment: 71 yo male on chronic Eliquis PTA for afib.  Now s/p LVAD placement 09/17/21, pharmacy asked to begin anticoagulation with warfarin today. Low-dose heparin infusion started as well.  INR up to 2.8 today despite low dose yesterday. Will hold warfarin tonight, stop heparin with INR >2, and reduce aspirin to 81mg . CBC and LDH stable. SCr continues to worsen.  Goal of Therapy:  INR 2-2.5 Monitor platelets by anticoagulation protocol: Yes   Plan:  -Stop heparin with therapeutic INR -Hold warfarin tonight with rapid rise and possible need for CRRT  soon -Daily INR, CBC, LDH   Arrie Senate, PharmD, [place], [facility] Clinical Pharmacist [phone] Please check AMION for all [facility] Pharmacy numbers 09/23/2021

## 2021-09-24 DIAGNOSIS — I5043 Acute on chronic combined systolic (congestive) and diastolic (congestive) heart failure: Secondary | ICD-10-CM | POA: Diagnosis not present

## 2021-09-24 DIAGNOSIS — I5022 Chronic systolic (congestive) heart failure: Secondary | ICD-10-CM

## 2021-09-24 DIAGNOSIS — Z95811 Presence of heart assist device: Secondary | ICD-10-CM

## 2021-09-24 DIAGNOSIS — R570 Cardiogenic shock: Secondary | ICD-10-CM | POA: Diagnosis not present

## 2021-09-24 LAB — CBC WITH DIFFERENTIAL/PLATELET
Abs Immature Granulocytes: 0.09 10*3/uL — ABNORMAL HIGH (ref 0.00–0.07)
Basophils Absolute: 0 10*3/uL (ref 0.0–0.1)
Basophils Relative: 0 %
Eosinophils Absolute: 0.4 10*3/uL (ref 0.0–0.5)
Eosinophils Relative: 3 %
HCT: 23 % — ABNORMAL LOW (ref 39.0–52.0)
Hemoglobin: 8.2 g/dL — ABNORMAL LOW (ref 13.0–17.0)
Immature Granulocytes: 1 %
Lymphocytes Relative: 4 %
Lymphs Abs: 0.4 10*3/uL — ABNORMAL LOW (ref 0.7–4.0)
MCH: 29.4 pg (ref 26.0–34.0)
MCHC: 35.7 g/dL (ref 30.0–36.0)
MCV: 82.4 fL (ref 80.0–100.0)
Monocytes Absolute: 1.1 10*3/uL — ABNORMAL HIGH (ref 0.1–1.0)
Monocytes Relative: 10 %
Neutro Abs: 9 10*3/uL — ABNORMAL HIGH (ref 1.7–7.7)
Neutrophils Relative %: 82 %
Platelets: 408 10*3/uL — ABNORMAL HIGH (ref 150–400)
RBC: 2.79 MIL/uL — ABNORMAL LOW (ref 4.22–5.81)
RDW: 17.4 % — ABNORMAL HIGH (ref 11.5–15.5)
WBC: 11 10*3/uL — ABNORMAL HIGH (ref 4.0–10.5)
nRBC: 0 % (ref 0.0–0.2)

## 2021-09-24 LAB — COMPREHENSIVE METABOLIC PANEL
ALT: <5 U/L (ref 0–44)
AST: 51 U/L — ABNORMAL HIGH (ref 15–41)
Albumin: 2 g/dL — ABNORMAL LOW (ref 3.5–5.0)
Alkaline Phosphatase: 154 U/L — ABNORMAL HIGH (ref 38–126)
Anion gap: 10 (ref 5–15)
BUN: 37 mg/dL — ABNORMAL HIGH (ref 8–23)
CO2: 23 mmol/L (ref 22–32)
Calcium: 8.2 mg/dL — ABNORMAL LOW (ref 8.9–10.3)
Chloride: 100 mmol/L (ref 98–111)
Creatinine, Ser: 3.45 mg/dL — ABNORMAL HIGH (ref 0.61–1.24)
GFR, Estimated: 18 mL/min — ABNORMAL LOW (ref >60)
Glucose, Bld: 89 mg/dL (ref 70–99)
Potassium: 4.4 mmol/L (ref 3.5–5.1)
Sodium: 133 mmol/L — ABNORMAL LOW (ref 135–145)
Total Bilirubin: 3.2 mg/dL — ABNORMAL HIGH (ref 0.3–1.2)
Total Protein: 5.8 g/dL — ABNORMAL LOW (ref 6.5–8.1)

## 2021-09-24 LAB — POCT I-STAT 7, (LYTES, BLD GAS, ICA,H+H)
Acid-Base Excess: 2 mmol/L (ref 0.0–2.0)
Bicarbonate: 24.7 mmol/L (ref 20.0–28.0)
Calcium, Ion: 1.14 mmol/L — ABNORMAL LOW (ref 1.15–1.40)
HCT: 26 % — ABNORMAL LOW (ref 39.0–52.0)
Hemoglobin: 8.8 g/dL — ABNORMAL LOW (ref 13.0–17.0)
O2 Saturation: 94 %
Patient temperature: 37.4
Potassium: 4.4 mmol/L (ref 3.5–5.1)
Sodium: 135 mmol/L (ref 135–145)
TCO2: 26 mmol/L (ref 22–32)
pCO2 arterial: 29.8 mmHg — ABNORMAL LOW (ref 32.0–48.0)
pH, Arterial: 7.527 — ABNORMAL HIGH (ref 7.350–7.450)
pO2, Arterial: 62 mmHg — ABNORMAL LOW (ref 83.0–108.0)

## 2021-09-24 LAB — PROTIME-INR
INR: 2.9 — ABNORMAL HIGH (ref 0.8–1.2)
Prothrombin Time: 30.4 seconds — ABNORMAL HIGH (ref 11.4–15.2)

## 2021-09-24 LAB — RENAL FUNCTION PANEL
Albumin: 1.9 g/dL — ABNORMAL LOW (ref 3.5–5.0)
Albumin: 1.9 g/dL — ABNORMAL LOW (ref 3.5–5.0)
Anion gap: 9 (ref 5–15)
Anion gap: 8 (ref 5–15)
BUN: 38 mg/dL — ABNORMAL HIGH (ref 8–23)
BUN: 28 mg/dL — ABNORMAL HIGH (ref 8–23)
CO2: 24 mmol/L (ref 22–32)
CO2: 25 mmol/L (ref 22–32)
Calcium: 8.2 mg/dL — ABNORMAL LOW (ref 8.9–10.3)
Calcium: 8 mg/dL — ABNORMAL LOW (ref 8.9–10.3)
Chloride: 100 mmol/L (ref 98–111)
Chloride: 100 mmol/L (ref 98–111)
Creatinine, Ser: 3.4 mg/dL — ABNORMAL HIGH (ref 0.61–1.24)
Creatinine, Ser: 2.64 mg/dL — ABNORMAL HIGH (ref 0.61–1.24)
GFR, Estimated: 19 mL/min — ABNORMAL LOW (ref >60)
GFR, Estimated: 25 mL/min — ABNORMAL LOW (ref >60)
Glucose, Bld: 91 mg/dL (ref 70–99)
Glucose, Bld: 125 mg/dL — ABNORMAL HIGH (ref 70–99)
Phosphorus: 3 mg/dL (ref 2.5–4.6)
Phosphorus: 2.2 mg/dL — ABNORMAL LOW (ref 2.5–4.6)
Potassium: 4.4 mmol/L (ref 3.5–5.1)
Potassium: 4.5 mmol/L (ref 3.5–5.1)
Sodium: 133 mmol/L — ABNORMAL LOW (ref 135–145)
Sodium: 133 mmol/L — ABNORMAL LOW (ref 135–145)

## 2021-09-24 LAB — MAGNESIUM: Magnesium: 2.5 mg/dL — ABNORMAL HIGH (ref 1.7–2.4)

## 2021-09-24 LAB — COOXEMETRY PANEL
Carboxyhemoglobin: 1.5 % (ref 0.5–1.5)
Methemoglobin: 0.9 % (ref 0.0–1.5)
O2 Saturation: 58.5 %
Total hemoglobin: 6.7 g/dL — CL (ref 12.0–16.0)

## 2021-09-24 LAB — LACTATE DEHYDROGENASE: LDH: 378 U/L — ABNORMAL HIGH (ref 98–192)

## 2021-09-24 LAB — GLUCOSE, CAPILLARY
Glucose-Capillary: 84 mg/dL (ref 70–99)
Glucose-Capillary: 106 mg/dL — ABNORMAL HIGH (ref 70–99)
Glucose-Capillary: 92 mg/dL (ref 70–99)
Glucose-Capillary: 105 mg/dL — ABNORMAL HIGH (ref 70–99)
Glucose-Capillary: 98 mg/dL (ref 70–99)

## 2021-09-24 LAB — BRAIN NATRIURETIC PEPTIDE: B Natriuretic Peptide: 1005.9 pg/mL — ABNORMAL HIGH (ref 0.0–100.0)

## 2021-09-24 LAB — PHOSPHORUS: Phosphorus: 3 mg/dL (ref 2.5–4.6)

## 2021-09-24 NOTE — Progress Notes (Signed)
[place] for Warfarin  Indication: LVAD/AF  Allergies  Allergen Reactions   Mushroom Extract Complex Nausea And Vomiting   Neosporin [Neomycin-Bacitracin Zn-Polymyx] Hives    Patient Measurements: Height: 5\' 9"  (175.3 cm) Weight: 85.8 kg (189 lb 2.5 oz) (patient's controller on bed, 1 pillow, 1 blanket) IBW/kg (Calculated) : 70.7 Heparin Dosing Weight: 83.7 kg  Vital Signs: Temp: 98.8 F (37.1 C) (01/04 0715) Temp Source: Bladder (01/04 0800) BP: 99/86 (01/04 0700) Pulse Rate: 146 (01/04 0715)  Labs: Recent Labs    09/22/21 0406 09/22/21 0431 09/23/21 0350 09/23/21 0355 09/23/21 1709 09/24/21 0409 09/24/21 0411  HGB 8.3*   < > 8.1* 14.3  --  8.2* 8.8*  HCT 23.4*   < > 23.0* 42.0  --  23.0* 26.0*  PLT 248  --  345  --   --  408*  --   LABPROT 21.9*  --  29.8*  --   --  30.4*  --   INR 1.9*  --  2.8*  --   --  2.9*  --   HEPARINUNFRC <0.10*  --  <0.10*  --   --   --   --   CREATININE 4.27*   < > 5.24*  --  4.43* 3.45*   3.40*  --    < > = values in this interval not displayed.     Estimated Creatinine Clearance: 21.9 mL/min (A) (by C-G formula based on SCr of 3.45 mg/dL (H)).   Medical History: Past Medical History:  Diagnosis Date   Arrhythmia    atrial fibrillation   CHF (congestive heart failure) (HCC)    Chronic kidney disease    Coronary artery disease    Hyperlipidemia    Hypertension    Myocardial infarct [facility])     Assessment: 71 yo male on chronic Eliquis PTA for afib.  Now s/p LVAD placement 09/17/21, pharmacy asked to begin anticoagulation with warfarin today. Low-dose heparin infusion started as well.  INR up to 2.9 today, warfarin held overnight with rapid rise and need for CRRT catheter. CBC and LDH stable. Will hold warfarin again tonight with CVTS planning to pull EPW once INR down.   Goal of Therapy:  INR 2-2.5 Monitor platelets by anticoagulation protocol: Yes   Plan:  -Hold warfarin tonight  as above -Daily INR, CBC, LDH   Arrie Senate, PharmD, [place], [facility] Clinical Pharmacist [phone] Please check AMION for all [facility] Pharmacy numbers 09/24/2021

## 2021-09-24 NOTE — Progress Notes (Signed)
Physical Therapy Treatment Patient Details Name: Isaac [name] MRN: [id number] DOB: 23-Sep-1950 Today's Date: 09/24/2021   History of Present Illness Pt is a 71 y.o. male admitted 09/07/21 with near-syncope, nausea, poor oral intake, generalized weakness. Workup for advanced heart failure and persistent inotropic with requirement, cardiogenic shock;. [place] 12/19. Impella placed on 12/23. LVAD placed 12/28. Pt with post op delirium and agitation. 1/3 started CRRT. PMH includes advanced CHF (LVEF <20%), AICD/PPM, PAF on Eliquis, CKD 3, HTN, HLD.    PT Comments    Pt now on CRRT. Able to amb with assist and with some incr assist now for bed mobility and sit to stand. Expect this to improve as fluid removed.    Recommendations for follow up therapy are one component of a multi-disciplinary discharge planning process, led by the attending physician.  Recommendations may be updated based on patient status, additional functional criteria and insurance authorization.  Follow Up Recommendations  Home health PT (May need to update as progress is monitored)     Assistance Recommended at Discharge Frequent or constant Supervision/Assistance  Patient can return home with the following     Equipment Recommendations  Rollator (4 wheels)    Recommendations for Other Services       Precautions / Restrictions Precautions Precautions: Fall;Other (comment) Precaution Comments: LVAD, CRRT, fem line (cleared for OOB by MD) Restrictions Weight Bearing Restrictions: Yes (sternal precautions)     Mobility  Bed Mobility Overal bed mobility: Needs Assistance Bed Mobility: Supine to Sit     Supine to sit: Mod assist;+2 for safety/equipment;HOB elevated     General bed mobility comments: assist to manage legs and trunk to EOB, pt holding pillow    Transfers Overall transfer level: Needs assistance Equipment used: Ambulation equipment used [employer] walker) Transfers: Sit to/from Stand Sit to Stand:  +2 physical assistance;Mod assist           General transfer comment: Assist to bring hips up and for balance    Ambulation/Gait Ambulation/Gait assistance: Min assist;+2 safety/equipment Gait Distance (Feet): 180 Feet Assistive device: Ethelene Hal Gait Pattern/deviations: Step-through pattern;Decreased stride length;Trunk flexed Gait velocity: decr Gait velocity interpretation: <1.31 ft/sec, indicative of household ambulator   General Gait Details: Assist for support and lines. Pt with flexed trunk leaning forward onto walker 3-4 standing rest breaks and pt standing more erect   Stairs             Wheelchair Mobility    Modified Rankin (Stroke Patients Only)       Balance Overall balance assessment: Needs assistance;Mild deficits observed, not formally tested Sitting-balance support: No upper extremity supported;Feet supported Sitting balance-Leahy Scale: Fair     Standing balance support: Bilateral upper extremity supported;During functional activity Standing balance-Leahy Scale: Poor Standing balance comment: BUE spport and min assist for standing                            Cognition Arousal/Alertness: Awake/alert Behavior During Therapy: Flat affect Overall Cognitive Status: Impaired/Different from baseline Area of Impairment: Problem solving;Following commands                       Following Commands: Follows one step commands consistently;Follows one step commands with increased time     Problem Solving: Slow processing;Requires verbal cues General Comments: continues to require increased time to process and requires verbal cueing        Exercises  General Comments General comments (skin integrity, edema, etc.): VSS on O2 (4L at rest, 6L during activity)      Pertinent Vitals/Pain Pain Assessment: Faces Faces Pain Scale: Hurts little more Pain Location: chest Pain Descriptors / Indicators: Sore Pain Intervention(s):  Monitored during session;Repositioned    Home Living                          Prior Function            PT Goals (current goals can now be found in the care plan section) Progress towards PT goals: Progressing toward goals    Frequency    Min 3X/week      PT Plan Current plan remains appropriate    Co-evaluation PT/OT/SLP Co-Evaluation/Treatment: Yes Reason for Co-Treatment: Complexity of the patient's impairments (multi-system involvement) PT goals addressed during session: Mobility/safety with mobility OT goals addressed during session: ADL's and self-care      AM-PAC PT "6 Clicks" Mobility   Outcome Measure  Help needed turning from your back to your side while in a flat bed without using bedrails?: A Lot Help needed moving from lying on your back to sitting on the side of a flat bed without using bedrails?: Total Help needed moving to and from a bed to a chair (including a wheelchair)?: Total Help needed standing up from a chair using your arms (e.g., wheelchair or bedside chair)?: Total Help needed to walk in hospital room?: A Little Help needed climbing 3-5 steps with a railing? : Total 6 Click Score: 9    End of Session Equipment Utilized During Treatment: Oxygen Activity Tolerance: Patient tolerated treatment well Patient left: in chair;with nursing/sitter in room Nurse Communication: Mobility status (Nurse present for ambulation) PT Visit Diagnosis: Unsteadiness on feet (R26.81);Other abnormalities of gait and mobility (R26.89)     Time: 2263-3354 PT Time Calculation (min) (ACUTE ONLY): 53 min  Charges:  $Gait Training: 23-37 mins                     [place] Pager [phone] Office [place] 09/24/2021, 11:40 AM

## 2021-09-24 NOTE — Progress Notes (Signed)
Outpatient HF social worker met with pt and wife at bedside to check in.  Pt seems very lethargic and was quite cold- was responsive but hard to understand and spoke in only a few words at a time.  Wife seems in good spirits though admits it has been a hard road so far.  No needs expressed at this time by pt or wife- CSW will continue to follow during implant admission and assist as needed  Jorge Ny, [facility] Desk#: [phone] Cell#: [phone]

## 2021-09-24 NOTE — Progress Notes (Signed)
Patient ID: Isaac [name], male   DOB: 1951-09-04, 71 y.o.   MRN: [id number]  HeartMate 3 Rounding Note  Subjective:    No specific complaints this am. Pain minimal.   Co-ox 59% on milrinone 0.25, epi 6, NE down to 5.   LVAD INTERROGATION:  HeartMate IIl LVAD:  Flow 4.8 liters/min, speed 5500, power 4, PI 3.2.   Objective:    Vital Signs:   Temp:  [96.1 F (35.6 C)-99.3 F (37.4 C)] 98.8 F (37.1 C) (01/04 0715) Pulse Rate:  [55-231] 146 (01/04 0715) Resp:  [12-28] 17 (01/04 0715) BP: (79-118)/(59-99) 99/86 (01/04 0700) SpO2:  [90 %-99 %] 95 % (01/04 0715) Arterial Line BP: (80-118)/(63-92) 93/77 (01/04 0715) Weight:  [85.8 kg] 85.8 kg (01/04 0500) Last BM Date: 09/23/21 Mean arterial Pressure 90  Intake/Output:   Intake/Output Summary (Last 24 hours) at 09/24/2021 0906 Last data filed at 09/24/2021 0800 Gross per 24 hour  Intake 1418.8 ml  Output 2879 ml  Net -1460.2 ml     Physical Exam: General:  Well appearing. No resp difficulty HEENT: normal Neck: Normal Cor: distant heart sounds with LVAD hum present. Lungs: clear Abdomen: soft, nontender, nondistended. Good bowel sounds. Extremities: moderate diffuse edema Neuro: alert & orientedx3, cranial nerves grossly intact. moves all 4 extremities w/o difficulty. Affect pleasant  Telemetry: atrial fib low 100's  Labs: Basic Metabolic Panel: Recent Labs  Lab 09/20/21 0251 09/20/21 1946 09/21/21 0344 09/21/21 0356 09/22/21 0406 09/22/21 0431 09/22/21 1444 09/23/21 0350 09/23/21 0355 09/23/21 1709 09/24/21 0409 09/24/21 0411  NA 127*   < > 131*   < > 130*   < > 131* 130* 134* 132* 133*   133* 135  K 3.7   < > 4.1   < > 4.2   < > 4.0 4.0 3.9 4.0 4.4   4.4 4.4  CL 93*   < > 96*  --  97*  --  96* 97*  --  98 100   100  --   CO2 23   < > 23  --  22  --  22 21*  --  22 23   24   --   GLUCOSE 357*   < > 67*  --  112*  --  125* 79  --  164* 89   91  --   BUN 38*   < > 45*  --  50*  --  55* 60*  --  52* 37*    38*  --   CREATININE 2.61*   < > 3.33*  --  4.27*  --  4.62* 5.24*  --  4.43* 3.45*   3.40*  --   CALCIUM 7.9*   < > 8.3*  --  8.0*  --  8.3* 8.2*  --  8.0* 8.2*   8.2*  --   MG 2.2  --  2.3  --  2.3  --   --  2.6*  --   --  2.5*  --   PHOS 4.1  --  4.6  --  4.5  --   --  5.2*  --  4.3 3.0   3.0  --    < > = values in this interval not displayed.    Liver Function Tests: Recent Labs  Lab 09/18/21 0407 09/19/21 0358 09/20/21 0251 09/23/21 1709 09/24/21 0409  AST 122* 113* 86*  --  51*  ALT 26 9 5   --  <5  ALKPHOS 49 67 74  --  154*  BILITOT 3.1* 4.3* 4.7*  --  3.2*  PROT 4.7* 4.5* 4.8*  --  5.8*  ALBUMIN 2.6* 2.3* 2.3* 2.0* 2.0*   1.9*   No results for input(s): LIPASE, AMYLASE in the last 168 hours. Recent Labs  Lab 09/20/21 0701  AMMONIA <10    CBC: Recent Labs  Lab 09/20/21 0251 09/20/21 1709 09/21/21 0344 09/21/21 0356 09/22/21 0406 09/22/21 0431 09/23/21 0350 09/23/21 0355 09/24/21 0409 09/24/21 0411  WBC 16.7* 17.7* 16.6*  --  14.7*  --  13.1*  --  11.0*  --   NEUTROABS 14.4*  --  14.6*  --  12.4*  --  10.7*  --  9.0*  --   HGB 7.9* 8.0* 8.3*   < > 8.3* 8.2* 8.1* 14.3 8.2* 8.8*  HCT 21.5* 22.2* 22.8*   < > 23.4* 24.0* 23.0* 42.0 23.0* 26.0*  MCV 85.0 84.7 83.2  --  84.2  --  83.6  --  82.4  --   PLT 121* 152 185  --  248  --  345  --  408*  --    < > = values in this interval not displayed.    INR: Recent Labs  Lab 09/20/21 0251 09/21/21 0344 09/22/21 0406 09/23/21 0350 09/24/21 0409  INR 1.5* 1.4* 1.9* 2.8* 2.9*    Other results: EKG:   Imaging: ECHOCARDIOGRAM COMPLETE  Result Date: 09/23/2021    ECHOCARDIOGRAM REPORT   Patient Name:   Isaac [name] Date of Exam: 09/23/2021 Medical Rec #:  [id number]           Height:       69.0 in Accession #:    [id number]          Weight:       194.0 lb Date of Birth:  1951/01/15            BSA:          2.039 m Patient Age:    66 years            BP:           0/0 mmHg Patient Gender: M                    HR:           121 bpm. Exam Location:  Inpatient Procedure: 2D Echo, Cardiac Doppler and Color Doppler Indications:    CHF-Acute systolic  History:        Patient has prior history of Echocardiogram examinations, most                 recent 09/16/2001. CHF, CAD, Defibrillator; Arrythmias:Atrial                 Fibrillation.  Sonographer:    Clayton Lefort RDCS (AE) Referring Phys: [place] Comments: No subcostal window. HeartMate 3 @ 1517 RPM. Subcostal and SSN windows covered with bandages. IMPRESSIONS  1. Left ventricular ejection fraction, by estimation, is 20 to 25%. The left ventricle has severely decreased function. The left ventricle demonstrates global hypokinesis. There is mild left ventricular hypertrophy. Left ventricular diastolic parameters  are indeterminate. LVAD inflow cannula at apex, not well-visualized.  2. Peak RV-RA gradient 28 mmHg. Right ventricular systolic function is moderately reduced. The right ventricular size is normal.  3. Right atrial size was mildly dilated.  4. The aortic valve opens every 2nd beat. The aortic valve is tricuspid. Aortic valve regurgitation  is not visualized. No aortic stenosis is present.  5. The tricuspid valve is abnormal. Tricuspid valve regurgitation is moderate to severe.  6. The mitral valve is normal in structure. No evidence of mitral valve regurgitation. No evidence of mitral stenosis.  7. There is a left pleural effusion.  8. A small pericardial effusion is present, primarily towards the apex. FINDINGS  Left Ventricle: Left ventricular ejection fraction, by estimation, is 20 to 25%. The left ventricle has severely decreased function. The left ventricle demonstrates global hypokinesis. The left ventricular internal cavity size was normal in size. There is mild left ventricular hypertrophy. Left ventricular diastolic parameters are indeterminate. Right Ventricle: Peak RV-RA gradient 28 mmHg. The right ventricular size is normal. No  increase in right ventricular wall thickness. Right ventricular systolic function is moderately reduced. Left Atrium: Left atrial size was normal in size. Right Atrium: Right atrial size was mildly dilated. Pericardium: There is a left pleural effusion. A small pericardial effusion is present. Mitral Valve: The mitral valve is normal in structure. No evidence of mitral valve regurgitation. No evidence of mitral valve stenosis. Tricuspid Valve: The tricuspid valve is abnormal. Tricuspid valve regurgitation is moderate to severe. Aortic Valve: The aortic valve opens every 2nd beat. The aortic valve is tricuspid. Aortic valve regurgitation is not visualized. No aortic stenosis is present. Pulmonic Valve: The pulmonic valve was normal in structure. Pulmonic valve regurgitation is not visualized. Aorta: The aortic root is normal in size and structure. Venous: The inferior vena cava was not well visualized. IAS/Shunts: No atrial level shunt detected by color flow Doppler. Additional Comments: A device lead is visualized in the right ventricle.  LEFT VENTRICLE PLAX 2D LVIDd:         5.10 cm LVIDs:         3.90 cm LV PW:         1.40 cm LV IVS:        1.20 cm LVOT diam:     2.00 cm LVOT Area:     3.14 cm  RIGHT VENTRICLE RV Basal diam:  3.70 cm RV Mid diam:    2.60 cm TAPSE (M-mode): 0.5 cm LEFT ATRIUM             Index        RIGHT ATRIUM           Index LA diam:        3.90 cm 1.91 cm/m   RA Area:     19.80 cm LA Vol (A2C):   42.1 ml 20.64 ml/m  RA Volume:   53.30 ml  26.13 ml/m LA Vol (A4C):   33.3 ml 16.33 ml/m LA Biplane Vol: 36.7 ml 18.00 ml/m  TRICUSPID VALVE TR Peak grad:   27.7 mmHg TR Vmax:        263.00 cm/s  SHUNTS Systemic Diam: 2.00 cm Dalton McleanMD Electronically signed by Franki Monte Signature Date/Time: 09/23/2021/3:56:29 PM    Final      Medications:     Scheduled Medications:  sodium chloride   Intravenous Once   sodium chloride   Intravenous Once   aspirin  81 mg Oral Daily    bisacodyl  10 mg Oral Daily   Or   bisacodyl  10 mg Rectal Daily   chlorhexidine gluconate (MEDLINE KIT)  15 mL Mouth Rinse BID   Chlorhexidine Gluconate Cloth  6 each Topical Daily   docusate sodium  200 mg Oral Daily   feeding supplement  237 mL Oral  TID BM   insulin aspart  0-15 Units Subcutaneous Q4H   mouth rinse  15 mL Mouth Rinse BID   montelukast  5 mg Oral QHS   multivitamin with minerals  1 tablet Oral Daily   pantoprazole  40 mg Oral Daily   polyethylene glycol  17 g Oral Daily   rosuvastatin  10 mg Oral Daily   sodium chloride flush  10-40 mL Intracatheter Q12H   sodium chloride flush  3 mL Intravenous Q12H   Warfarin - Pharmacist Dosing Inpatient   Does not apply q1600    Infusions:   prismasol BGK 4/2.5 400 mL/hr at 09/24/21 0005    prismasol BGK 4/2.5 200 mL/hr at 09/24/21 0530   sodium chloride Stopped (09/20/21 0304)   sodium chloride Stopped (09/18/21 0803)   sodium chloride 20 mL/hr at 09/17/21 1530   amiodarone 30 mg/hr (09/24/21 0800)   epinephrine 6 mcg/min (09/24/21 0800)   lactated ringers     lactated ringers 20 mL/hr at 09/20/21 2027   milrinone 0.25 mcg/kg/min (09/24/21 0800)   norepinephrine (LEVOPHED) Adult infusion 5 mcg/min (09/24/21 0800)   prismasol BGK 4/2.5 1,800 mL/hr at 09/24/21 0802    PRN Medications: sodium chloride, albuterol, heparin, heparin, lip balm, melatonin, morphine injection, ondansetron (ZOFRAN) IV, oxyCODONE, polyethylene glycol, sodium chloride flush, sodium chloride flush, sorbitol, traMADol   Assessment:   POD 6 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 Moderate RV systolic dysfunction by echo preop but looked better after LVAD insertion in OR. 2.   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 Decided against removal of ICD/pacer and TV replacement. Improved a little at the  end of surgery. 3.  Hx of atrial fib on amio. Has been in and out postop. 4.   Stage 3 CKD with progressive rise in creat up to 5.24 with oliguria, marked volume overload and no response to lasix.   Preop was as low as 1.49 and then increased to 1.7 before surgery after large diuresis the day before. CRRT started yesterday for volume removal. Removing 100cc/hr.  5.   CAD with PCI of OM1 in 2007 and RCA in 2013. 6.   Acute postop blood loss anemia. stable.  Plan/Discussion:    Hemodynamics stable. Wean NE as tolerated. Maintain MAP of 80 for kidney perfusion.    CRRT for volume removal. Unhook intermittently to ambulate and get OOB.    INR 2.9.  No Coumadin tonight.   Will remove pacing wires when INR comes back down.   Encourage nutrition.   I reviewed the LVAD parameters from today, and compared the results to the patient's prior recorded data.  No programming changes were made.  The LVAD is functioning within specified parameters.  The patient performs LVAD self-test daily.  LVAD interrogation was negative for any significant power changes, alarms or PI events/speed drops.   Length of Stay: [address]  Fernande Boyden [facility] 09/24/2021, 9:06 AM

## 2021-09-24 NOTE — Progress Notes (Signed)
LVAD Coordinator Rounding Note:  Admitted 09/06/21 due to CHF exacerbation to Dr Claris Gladden service. He was started on Milrinone on admission. Febrile n 09/10/21- suspected to have UTI- antibiotics started. Impella 5.5 placed 09/12/21.   HM III LVAD implanted on 09/17/21 by Dr Cyndia Bent under destination therapy criteria. 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   Pt laying in bed this morning. Just walked with nurse.  HD catheter placed in Right femoral vein yesterday by CCM and CVVH started. CR 3.45 today.  Remains in afib on Amiodarone gtt. Two short runs NSVT overnight.   No family at bedside.   Vital signs: Temp: 98.4 HR: 115 afib Doppler Pressure: 80 Radial Art Line: 93/77 (84) O2 Sat: 95% on 4L [place] Wt: 185.2>196.8>194>189.1 lbs    LVAD interrogation reveals:  Speed: 5500 Flow: 5.2 Power: 4.1w PI: 1.7 Hematocrit: 26  Alarms: none Events: none  Fixed speed: 5500 Low speed limit: 5200  Drive Line: Existing VAD dressing removed and site care performed using sterile technique. Drive line exit site cleaned with Chlora prep applicators x 2, allowed to dry, and gauze dressing with silver strip applied. Exit site not incorporated, the velour is fully implanted at exit site. 1 suture in place. Scant amount of bloody drainage noted at site and on gauze. Slight redness, no tenderness, foul odor or rash noted. Drive line anchor secure x 2. Daily dressing changes per Nurse Davonna Belling or [place] coordinator. Next dressing change due: 09/25/21.       Labs:  LDH trend: 344>379>370>378  INR trend: 1.6>1.8>2.8>2.9  WBC trend: 14.1>13.6>13.1>11  Cr trend: 2.11>2.39>5.24>3.45  Hgb trend: 6.5>7.5>8.1>8.8  Anticoagulation Plan: -INR Goal: 2.0 - 2.5 -ASA Dose: 81 mg daily   Blood Products:  Intra Op: 09/17/21  1 PLT  4 FFP   4 PRBC  Cellsaver- 803 cc Post Op:  - 09/18/21>> 3 PRBCs - 09/19/21>> 1  PRBC  Device: - [employer] dual -Therapies: OFF  *Will need to be referred to device clinic at discharge*  Arrythmias: 3 episodes of VT that required defib while in OR 09/17/21. Currently on Amiodarone gtt. Hx: afib.   Respiratory: extubated 09/18/21  Infection:  09/10/21>> blood cultures >> No growth 5 days; final 09/12/21>> urine culture >> No growth; final   Drips:  Amiodarone 30 mg/hr Epinephrine 6 mcg/min Milrinone 0.25 mcg/kg/min Levophed 5 mcg/min Vasopressin 0.04-- off   Patient Education: Provided pt's wife with HM III Patient Manual as requested.  Will plan to start dressing change education with pt's wife this week. She observed dressing change last week. No family at bedside today. Pt has been changing power sources. Pt educated on the need for mask and hat during sterile dressing change today.  Plan/Recommendations:  1. [place] VAD coordinator for drive line or equipment concerns 2. Daily drive line dressing change per Nurse Davonna Belling or [place] coordinator  [name] RN [place] Coordinator  Office: [phone]  24/7 Pager: [phone]

## 2021-09-24 NOTE — Plan of Care (Signed)
°  Problem: Education: Goal: Knowledge of the prescribed therapeutic regimen will improve Outcome: Progressing   Problem: Activity: Goal: Risk for activity intolerance will decrease Outcome: Progressing   Problem: Cardiac: Goal: Ability to maintain an adequate cardiac output will improve Outcome: Progressing   Problem: Coping: Goal: Level of anxiety will decrease Outcome: Progressing   Problem: Fluid Volume: Goal: Risk for excess fluid volume will decrease Outcome: Progressing   Problem: Clinical Measurements: Goal: Ability to maintain clinical measurements within normal limits will improve Outcome: Progressing Goal: Will remain free from infection Outcome: Progressing   Problem: Respiratory: Goal: Will regain and/or maintain adequate ventilation Outcome: Progressing

## 2021-09-24 NOTE — Progress Notes (Addendum)
Occupational Therapy Treatment Patient Details Name: Isaac [name] MRN: [id number] DOB: [birth year] Today's Date: 09/24/2021   History of present illness Pt is a 71 y.o. male admitted 09/07/21 with near-syncope, nausea, poor oral intake, generalized weakness. Workup for advanced heart failure and persistent inotropic with requirement, cardiogenic shock;. [facility] 12/19. Impella placed on 12/23. LVAD placed 12/28. Pt with post op delirium and agitation. 1/3 started CRRT. PMH includes advanced CHF (LVEF <20%), AICD/PPM, PAF on Eliquis, CKD 3, HTN, HLD.   OT comments  Pt supine in bed and agreeable to OT/PT session. Cleared for OOB with MD, RN took pt off CRRT for mobilization. Pt educated on LVAD switch, battery check, and components to emergency bag. Requires assist to switch to batteries due to B hand edema (L>R) and weakness.  Pt requires mod assist +2 for bed mobility and transfers, engage in grooming at sink with setup assist sitting. Continue per POC, recommend HHOT at this time.  Will follow.    Recommendations for follow up therapy are one component of a multi-disciplinary discharge planning process, led by the attending physician.  Recommendations may be updated based on patient status, additional functional criteria and insurance authorization.    Follow Up Recommendations  Home health OT    Assistance Recommended at Discharge Frequent or constant Supervision/Assistance  Patient can return home with the following      Equipment Recommendations  BSC/3in1    Recommendations for Other Services      Precautions / Restrictions Precautions Precautions: Fall;Other (comment) Precaution Comments: LVAD, CRRT, fem line (cleared for OOB by MD) Restrictions Weight Bearing Restrictions: Yes (sternal precautions)       Mobility Bed Mobility Overal bed mobility: Needs Assistance Bed Mobility: Supine to Sit     Supine to sit: Mod assist;+2 for safety/equipment;HOB elevated     General  bed mobility comments: assist to manage legs and trunk to EOB, pt holding pillow    Transfers                         Balance Overall balance assessment: Needs assistance;Mild deficits observed, not formally tested Sitting-balance support: No upper extremity supported;Feet supported Sitting balance-Leahy Scale: Fair     Standing balance support: Bilateral upper extremity supported;During functional activity Standing balance-Leahy Scale: Poor Standing balance comment: BUE spport and min assist for standing                           ADL either performed or assessed with clinical judgement   ADL Overall ADL's : Needs assistance/impaired     Grooming: Oral care;Set up;Sitting Grooming Details (indicate cue type and reason): sitting at sink         Upper Body Dressing : Maximal assistance;Sitting   Lower Body Dressing: Total assistance;+2 for physical assistance;+2 for safety/equipment;Sit to/from stand   Toilet Transfer: Moderate assistance;+2 for physical assistance;+2 for safety/equipment Toilet Transfer Details (indicate cue type and reason): using eva walker         Functional mobility during ADLs: Minimal assistance;+2 for physical assistance;+2 for safety/equipment (eva walker) General ADL Comments: pt educated on switching from power to batteries, items needed for emergency bag, how to check batteries and how to place batteries into clips    Extremity/Trunk Assessment              Vision       Perception     Praxis  Cognition Arousal/Alertness: Awake/alert Behavior During Therapy: Flat affect Overall Cognitive Status: Impaired/Different from baseline Area of Impairment: Problem solving;Following commands                       Following Commands: Follows one step commands consistently;Follows one step commands with increased time     Problem Solving: Slow processing;Requires verbal cues General Comments: continues  to require increased time to process and requires verbal cueing          Exercises     Shoulder Instructions       General Comments VSS on O2 (4L at rest, 6L during mobility)    Pertinent Vitals/ Pain       Pain Assessment: Faces Faces Pain Scale: Hurts little more Pain Location: chest Pain Descriptors / Indicators: Sore Pain Intervention(s): Monitored during session;Repositioned  Home Living                                          Prior Functioning/Environment              Frequency  Min 2X/week        Progress Toward Goals  OT Goals(current goals can now be found in the care plan section)  Progress towards OT goals: Progressing toward goals  Acute Rehab OT Goals Patient Stated Goal: feel better OT Goal Formulation: With patient Time For Goal Achievement: 10/06/21 Potential to Achieve Goals: Good  Plan Discharge plan remains appropriate;Frequency remains appropriate    Co-evaluation    PT/OT/SLP Co-Evaluation/Treatment: Yes Reason for Co-Treatment: Complexity of the patient's impairments (multi-system involvement)   OT goals addressed during session: ADL's and self-care      AM-PAC OT "6 Clicks" Daily Activity     Outcome Measure   Help from another person eating meals?: None Help from another person taking care of personal grooming?: A Little Help from another person toileting, which includes using toliet, bedpan, or urinal?: A Lot Help from another person bathing (including washing, rinsing, drying)?: A Lot Help from another person to put on and taking off regular upper body clothing?: A Lot Help from another person to put on and taking off regular lower body clothing?: Total 6 Click Score: 14    End of Session Equipment Utilized During Treatment: Oxygen (eva walker)  OT Visit Diagnosis: Unsteadiness on feet (R26.81);Muscle weakness (generalized) (M62.81);Other symptoms and signs involving cognitive function;Other  (comment);Pain (decreased activity tolerance)   Activity Tolerance Patient tolerated treatment well   Patient Left in chair;with call bell/phone within reach;with nursing/sitter in room (nursing finishing bath and then plans to get him back to bed)   Nurse Communication Mobility status        Time: 7867-5449 OT Time Calculation (min): 53 min  Charges: OT General Charges $OT Visit: 1 Visit OT Treatments $Self Care/Home Management : 23-37 mins  [place] Pager [phone] Office [place] 09/24/2021, 10:36 AM

## 2021-09-24 NOTE — Progress Notes (Addendum)
Patient ID: Isaac [name], male   DOB: 1951/05/23, 71 y.o.   MRN: [id number]    Advanced Heart Failure VAD Team Note  PCP-Cardiologist: None   Subjective:    12/19 RHC- RA 7, PA 39/14 (25), PCWP 11, CO 6.4 CI 3. Thermo 3.5 1.8. Lasix drip stopped after cath. 12/20 Swan removed.  Norepi 3 mcg added.  12/21 Fever. Blood CX drawn. UA + leukocytes. Started on vanc and cefepime. Diuresed with IV lasix + metolazone.  12/23 Impella 5.5 placed 12/28 HM III LVAD, ICD leads plastered to tricuspid valve with severe TR, valve not replaced and some improvement in TR with LVAD placement.  12/29 Extubated 01/03 Started CRRT  On milrinone 0.25 + 6 Epi, + 5 NE. Co-ox 59%. MAP 80s.   Creatinine up to 5.24 oln 01/03 with decreased UOP. Started CRRT on 01/03. Pulling for net neg 100/hr. Down 5 lb ovenright.   Afebrile, WBCs 17.7 => 16.6 => 14.7  => 13.1  Remains in AF, rate 100s - 110s. Two short runs NSVT overnight. On amiodarone gtt.   LVAD INTERROGATION:  HeartMate III LVAD:   Flow 4.8 liters/min, speed 5500, power 4, PI 3.2.  No alarms LDH 344 => 379 => 376 => 379 => 356 => 370 => 378  INR 2.9  Plts 113 => 121 => 185 => 248 => 345 => 408 Hgb 7.9 => 8.3 => 8.3 => 8.1 => 8.2  Objective:    Vital Signs:   Temp:  [96.1 F (35.6 C)-99.3 F (37.4 C)] 98.8 F (37.1 C) (01/04 0715) Pulse Rate:  [55-231] 146 (01/04 0715) Resp:  [12-28] 17 (01/04 0715) BP: (79-118)/(59-99) 99/86 (01/04 0700) SpO2:  [90 %-99 %] 95 % (01/04 0715) Arterial Line BP: (80-118)/(63-92) 93/77 (01/04 0715) Weight:  [85.8 kg] 85.8 kg (01/04 0500) Last BM Date: 09/23/21 Mean arterial Pressure  80s  Intake/Output:   Intake/Output Summary (Last 24 hours) at 09/24/2021 0828 Last data filed at 09/24/2021 0700 Gross per 24 hour  Intake 1213.47 ml  Output 2736 ml  Net -1522.53 ml     Physical Exam   CVP 15 Physical Exam: GENERAL: Sitting up in bed. No distress. HEENT: normal  NECK: Supple, JVP 14 cm.  2+  bilaterally, no bruits.  No lymphadenopathy or thyromegaly appreciated.   CARDIAC:  Mechanical heart sounds with LVAD hum present.  LUNGS:  Clear to auscultation bilaterally.  ABDOMEN:  Soft, round, nontender, positive bowel sounds x4.     LVAD exit site: well-healed and incorporated.  Dressing dry and intact.  No erythema or drainage.  Stabilization device present and accurately applied.  Driveline dressing is being changed daily per sterile technique. EXTREMITIES:  Warm and dry, no cyanosis, clubbing, rash, 2+ edema, R fem HD cath NEUROLOGIC:  Alert and oriented x 4.  Gait steady.  No aphasia.  No dysarthria.  Affect pleasant.        Telemetry   Atrial fibrillation with rates 100s, 2 runs of NSVT longest 9 beats   Labs   Basic Metabolic Panel: Recent Labs  Lab 09/20/21 0251 09/20/21 1946 09/21/21 0344 09/21/21 0356 09/22/21 0406 09/22/21 0431 09/22/21 1444 09/23/21 0350 09/23/21 0355 09/23/21 1709 09/24/21 0409 09/24/21 0411  NA 127*   < > 131*   < > 130*   < > 131* 130* 134* 132* 133*   133* 135  K 3.7   < > 4.1   < > 4.2   < > 4.0 4.0 3.9 4.0 4.4  4.4 4.4  CL 93*   < > 96*  --  97*  --  96* 97*  --  98 100   100  --   CO2 23   < > 23  --  22  --  22 21*  --  _0 --   GLUCOSE 357*   < > 67*  --  112*  --  125* 79  --  164* 89   91  --   BUN 38*   < > 45*  --  50*  --  55* 60*  --  52* 37*   38*  --   CREATININE 2.61*   < > 3.33*  --  4.27*  --  4.62* 5.24*  --  4.43* 3.45*   3.40*  --   CALCIUM 7.9*   < > 8.3*  --  8.0*  --  8.3* 8.2*  --  8.0* 8.2*   8.2*  --   MG 2.2  --  2.3  --  2.3  --   --  2.6*  --   --  2.5*  --   PHOS 4.1  --  4.6  --  4.5  --   --  5.2*  --  4.3 3.0   3.0  --    < > = values in this interval not displayed.    Liver Function Tests: Recent Labs  Lab 09/18/21 0407 09/19/21 0358 09/20/21 0251 09/23/21 1709 09/24/21 0409  AST 122* 113* 86*  --  51*  ALT _1 --  <5  ALKPHOS 49 67 74  --  154*  BILITOT 3.1* 4.3* 4.7*  --   3.2*  PROT 4.7* 4.5* 4.8*  --  5.8*  ALBUMIN 2.6* 2.3* 2.3* 2.0* 2.0*   1.9*   No results for input(s): LIPASE, AMYLASE in the last 168 hours. Recent Labs  Lab 09/20/21 0701  AMMONIA <10    CBC: Recent Labs  Lab 09/20/21 0251 09/20/21 1709 09/21/21 0344 09/21/21 0356 09/22/21 0406 09/22/21 0431 09/23/21 0350 09/23/21 0355 09/24/21 0409 09/24/21 0411  WBC 16.7* 17.7* 16.6*  --  14.7*  --  13.1*  --  11.0*  --   NEUTROABS 14.4*  --  14.6*  --  12.4*  --  10.7*  --  9.0*  --   HGB 7.9* 8.0* 8.3*   < > 8.3* 8.2* 8.1* 14.3 8.2* 8.8*  HCT 21.5* 22.2* 22.8*   < > 23.4* 24.0* 23.0* 42.0 23.0* 26.0*  MCV 85.0 84.7 83.2  --  84.2  --  83.6  --  82.4  --   PLT 121* 152 185  --  248  --  345  --  408*  --    < > = values in this interval not displayed.    INR: Recent Labs  Lab 09/20/21 0251 09/21/21 0344 09/22/21 0406 09/23/21 0350 09/24/21 0409  INR 1.5* 1.4* 1.9* 2.8* 2.9*    Other results: EKG:    Imaging   ECHOCARDIOGRAM COMPLETE  Result Date: 09/23/2021    ECHOCARDIOGRAM REPORT   Patient Name:   Isaac [name] Date of Exam: 09/23/2021 Medical Rec #:  [id number]           Height:       69.0 in Accession #:    [id number]          Weight:       194.0 lb Date of Birth:  [birth year]            BSA:          2.039 m Patient Age:    52 years            BP:           0/0 mmHg Patient Gender: M                   HR:           121 bpm. Exam Location:  Inpatient Procedure: 2D Echo, Cardiac Doppler and Color Doppler Indications:    CHF-Acute systolic  History:        Patient has prior history of Echocardiogram examinations, most                 recent 09/16/2001. CHF, CAD, Defibrillator; Arrythmias:Atrial                 Fibrillation.  Sonographer:    Clayton Lefort RDCS (AE) Referring Phys: [place] Comments: No subcostal window. HeartMate 3 @ 1194 RPM. Subcostal and SSN windows covered with bandages. IMPRESSIONS  1. Left ventricular ejection fraction, by  estimation, is 20 to 25%. The left ventricle has severely decreased function. The left ventricle demonstrates global hypokinesis. There is mild left ventricular hypertrophy. Left ventricular diastolic parameters  are indeterminate. LVAD inflow cannula at apex, not well-visualized.  2. Peak RV-RA gradient 28 mmHg. Right ventricular systolic function is moderately reduced. The right ventricular size is normal.  3. Right atrial size was mildly dilated.  4. The aortic valve opens every 2nd beat. The aortic valve is tricuspid. Aortic valve regurgitation is not visualized. No aortic stenosis is present.  5. The tricuspid valve is abnormal. Tricuspid valve regurgitation is moderate to severe.  6. The mitral valve is normal in structure. No evidence of mitral valve regurgitation. No evidence of mitral stenosis.  7. There is a left pleural effusion.  8. A small pericardial effusion is present, primarily towards the apex. FINDINGS  Left Ventricle: Left ventricular ejection fraction, by estimation, is 20 to 25%. The left ventricle has severely decreased function. The left ventricle demonstrates global hypokinesis. The left ventricular internal cavity size was normal in size. There is mild left ventricular hypertrophy. Left ventricular diastolic parameters are indeterminate. Right Ventricle: Peak RV-RA gradient 28 mmHg. The right ventricular size is normal. No increase in right ventricular wall thickness. Right ventricular systolic function is moderately reduced. Left Atrium: Left atrial size was normal in size. Right Atrium: Right atrial size was mildly dilated. Pericardium: There is a left pleural effusion. A small pericardial effusion is present. Mitral Valve: The mitral valve is normal in structure. No evidence of mitral valve regurgitation. No evidence of mitral valve stenosis. Tricuspid Valve: The tricuspid valve is abnormal. Tricuspid valve regurgitation is moderate to severe. Aortic Valve: The aortic valve opens every  2nd beat. The aortic valve is tricuspid. Aortic valve regurgitation is not visualized. No aortic stenosis is present. Pulmonic Valve: The pulmonic valve was normal in structure. Pulmonic valve regurgitation is not visualized. Aorta: The aortic root is normal in size and structure. Venous: The inferior vena cava was not well visualized. IAS/Shunts: No atrial level shunt detected by color flow Doppler. Additional Comments: A device lead is visualized in the right ventricle.  LEFT VENTRICLE PLAX 2D LVIDd:         5.10 cm LVIDs:         3.90 cm LV  PW:         1.40 cm LV IVS:        1.20 cm LVOT diam:     2.00 cm LVOT Area:     3.14 cm  RIGHT VENTRICLE RV Basal diam:  3.70 cm RV Mid diam:    2.60 cm TAPSE (M-mode): 0.5 cm LEFT ATRIUM             Index        RIGHT ATRIUM           Index LA diam:        3.90 cm 1.91 cm/m   RA Area:     19.80 cm LA Vol (A2C):   42.1 ml 20.64 ml/m  RA Volume:   53.30 ml  26.13 ml/m LA Vol (A4C):   33.3 ml 16.33 ml/m LA Biplane Vol: 36.7 ml 18.00 ml/m  TRICUSPID VALVE TR Peak grad:   27.7 mmHg TR Vmax:        263.00 cm/s  SHUNTS Systemic Diam: 2.00 cm Keyontay Stolz McleanMD Electronically signed by Franki Monte Signature Date/Time: 09/23/2021/3:56:29 PM    Final      Medications:     Scheduled Medications:  sodium chloride   Intravenous Once   sodium chloride   Intravenous Once   aspirin  81 mg Oral Daily   bisacodyl  10 mg Oral Daily   Or   bisacodyl  10 mg Rectal Daily   chlorhexidine gluconate (MEDLINE KIT)  15 mL Mouth Rinse BID   Chlorhexidine Gluconate Cloth  6 each Topical Daily   docusate sodium  200 mg Oral Daily   feeding supplement  237 mL Oral TID BM   insulin aspart  0-15 Units Subcutaneous Q4H   mouth rinse  15 mL Mouth Rinse BID   montelukast  5 mg Oral QHS   multivitamin with minerals  1 tablet Oral Daily   pantoprazole  40 mg Oral Daily   polyethylene glycol  17 g Oral Daily   rosuvastatin  10 mg Oral Daily   sodium chloride flush  10-40 mL  Intracatheter Q12H   sodium chloride flush  3 mL Intravenous Q12H   Warfarin - Pharmacist Dosing Inpatient   Does not apply q1600    Infusions:   prismasol BGK 4/2.5 400 mL/hr at 09/24/21 0005    prismasol BGK 4/2.5 200 mL/hr at 09/24/21 0530   sodium chloride Stopped (09/20/21 0304)   sodium chloride Stopped (09/18/21 0803)   sodium chloride 20 mL/hr at 09/17/21 1530   amiodarone 30 mg/hr (09/24/21 0700)   epinephrine 6 mcg/min (09/24/21 0700)   lactated ringers     lactated ringers 20 mL/hr at 09/20/21 2027   milrinone 0.25 mcg/kg/min (09/24/21 0700)   norepinephrine (LEVOPHED) Adult infusion 5 mcg/min (09/24/21 0700)   prismasol BGK 4/2.5 1,800 mL/hr at 09/24/21 0802    PRN Medications: sodium chloride, albuterol, heparin, heparin, lip balm, melatonin, morphine injection, ondansetron (ZOFRAN) IV, oxyCODONE, polyethylene glycol, sodium chloride flush, sodium chloride flush, sorbitol, traMADol   Assessment/Plan:    1. Acute on chronic systolic CHF:  Long-standing cardiomyopathy.  [place].  Echo this admission with EF < 20%, severe LV dilation, restrictive diastolic function, moderate RV dysfunction, moderate MR, mod-severe TR. Cause of cardiomyopathy is uncertain.  He has a history of CAD, but I do not think that the described CAD from the past could explain his cardiomyopathy, but CAD could have progressed.  With difficulty tolerating GDMT/need for midodrine and cardiorenal  syndrome as well as profound volume overload + NYHA class IV symptoms,  concerned for low output HF. Co-ox off milrinone was 36%, milrinone started and increased to 0.375 but CO remained low. NE added and Impella 5.5 placed 12/23. EF 10% on TEE 12/23. s/p HM III VAD on 12/28.  Echo 01/03 with EF 20-25%, RV moderately reduced.  -Currently on Epi 6, NE 5, mil 0.25.  Co-ox 59%.  Can wean Epi + NE slowly today, maintain MAPs to allow renal perfusion with AKI on CKD. -LVAD parameters stable. No alarms  overnight.  -Creatinine continues to trend higher 3.33 => 4.27 => 5.24. Now on CRRT. CVP 15 - Continue milrinone - INR 2.9. Hold warfarin. CTS planning to remove pacing wires once INR comes back down.  - Continue 81 mg aspirin - Continue UNNA boots 2. Tricuspid regurgitation: Has moderate-severe TR.  Tricuspid repair not done at time of VAD d/t proximity of ICD wires 3. Atrial fibrillation: Persistent since 10/22 based on device interrogation.  Was in junctional rhythm, now looks like he is back in AF with HR 100s.   - Continue amio at 30/hr 4. AKI on CKD stage 3: Creatinine 2.61 => 3.1 => 3.33 => 4.27 => 5.24.  Gradual rise (was about 1.7 pre-op) since surgery.  He has not been hypotensive post-op, ?intra-op hypotension with development of ATN => urine sediment looked like ATN per renal.  - Maintain MAP and CO.    - Now on CRRT. Pulling for net neg 100/hr. Weight down 5 lb overnight. Continue volume removal. CVP 15. - Nephrology following.   5. CAD: History of PCI to OM1 in 2007 and RCA in 2013.  No chest pain or ACS.  - Deferred cardiac cath due to AKI and plan for VAD - Continue Crestor.  6. ID: Had fever 12/21, PCT 1.03.  Has been afebrile since.  Blood cultures negative. ?Phlebitis at IV site. Completed course of abx. WBCs elevated to  17.7 => 16.6 => 14.7 => 13.1 => 11 post-op but no fever or evidence for infection. CXR with pulmonary vascular congestion but no PNA.   7. Constipation: Continue aggressive bowel regimen. Had BM yesterday. 8. Hyponatremia: Hypervolemic hyponatremia.  Na 133 today.    9. Anemia: Blood loss anemia, had hematoma at Impella 5.5 site with significant drainage after removal. Hgb 8.2 today.  -  transfuse hgb < 7.5 (would like to avoid extra volume if possible).  10. Confusion/delirium: Initially post-op and started on Precedex.  This has cleared, now up to chair and eating and has walked in hall.  - Encourage family at bedside.  11. F/E/N: Advance diet. Ensure  ordered. Encouraged PO intake.  Will pause CRRT briefly this morning so that he can work with PT and walk in unit. Has R fem HD cath. Needs to mobilize.   I reviewed the LVAD parameters from today, and compared the results to the patient's prior recorded data.  No programming changes were made.  The LVAD is functioning within specified parameters.  The patient performs LVAD self-test daily.  LVAD interrogation was negative for any significant power changes, alarms or PI events/speed drops.  LVAD equipment check completed and is in good working order.  Back-up equipment present.   LVAD education done on emergency procedures and precautions and reviewed exit site care.  FINCH, Lynder Parents, PA-C 09/24/2021 8:28 AM  Patient seen with PA, agree with the above note .  Co-ox 59% today, CVP 15.  He remains on epinephrine  6, milrinone 0.25, NE 5.  Echo was done yesterday, stable with mid-line septum, moderate RV dysfunction, aortic valve opens every other beat.    Has been started on CVVH.   General: Well appearing this am. NAD.  HEENT: Normal. Neck: Supple, JVP 14+ cm. Carotids OK.  Cardiac:  Mechanical heart sounds with LVAD hum present.  Lungs:  CTAB, normal effort.  Abdomen:  NT, ND, no HSM. No bruits or masses. +BS  LVAD exit site: Well-healed and incorporated. Dressing dry and intact. No erythema or drainage. Stabilization device present and accurately applied. Driveline dressing changed daily per sterile technique. Extremities:  Warm and dry. No cyanosis, clubbing, rash.  1+ edema to knees with Unna boots.  Neuro:  Alert & oriented x 3. Cranial nerves grossly intact. Moves all 4 extremities w/o difficulty. Affect pleasant    ATN per renal, likely from intra-op and early post-op hypotension.  Now on CVVH.  CVP 15.  - Gentle UF, aim for net negative 100 cc/hr.   Can wean NE, epinephrine down today.  NE already weaned to 5.  Stable MAP, echo yesterday looked ok.  Keep same speed on LVAD.   INR  therapeutic at 2.9.   Needs to ambulate today, can unhook from CVVH to allow this.   CRITICAL CARE Performed by: Loralie Champagne  Total critical care time: 40 minutes  Critical care time was exclusive of separately billable procedures and treating other patients.  Critical care was necessary to treat or prevent imminent or life-threatening deterioration.  Critical care was time spent personally by me on the following activities: development of treatment plan with patient and/or surrogate as well as nursing, discussions with consultants, evaluation of patient's response to treatment, examination of patient, obtaining history from patient or surrogate, ordering and performing treatments and interventions, ordering and review of laboratory studies, ordering and review of radiographic studies, pulse oximetry and re-evaluation of patient's condition.  Loralie Champagne 09/24/2021 8:59 AM

## 2021-09-24 NOTE — Progress Notes (Addendum)
[facility] Progress Note   71 y.o. 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 Impella was placed on 09/12/2021 and then HM3 on 12/28. CPB time was 161 minutes.  Creatinine was 2.02 12/09/2015 and BL ~1.6-2.    Assessment/ Plan:   Acute kidney injury on CKD3b with a baseline cr in the 1.6-2 range. Acute component either from cardiorenal +/- ATN with decreased renal function from hemodynamics and CPB + hypotensive episodes + pressors. No absolute indication for RRT but he is heading in that direction which I di/w the spouse who was bedside. Weights have increased from 78.4kg to 86.3kg which is c/w worsening edema. CVP ~12. Failed Lasix challenge and CRRT started 1/3. - I personally reviewed the urine microscopy and c/w ATN.  Seen on CRRT w/ 4K baths pre/post/dialysate Tolerating UF -> 50-100 ml/hr as tolerated; will try to avoid hypotensive episodes which will prolong recovery Rt fem cath  -Cont monitoring daily I/Os + weights  -Maintain MAP>65 for optimal renal perfusion.  -Avoid nephrotoxic medications including NSAIDs, contrast. - Dose medications for a GFR <15 ml/min and also on CRRT.   HFrEF with underlying CM s/p HM3 with CVP in the 12 range with worsening edema despite Lasix but unfortunately renal function worsening. On Milrinone, epinephrine + Levophed.  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  Anemia Afib  Subjective:   CVP 12-14 with Co-ox 65% with worsening dyspnea 1/3 -> CRRT which he is tolerating. States that his breathing is more comfortable now.   Objective:   BP 99/86    Pulse (!) 146    Temp 98.8 F (37.1 C)    Resp 17    Ht _0  (1.753 m)    Wt 85.8 kg Comment: patient's controller on bed, 1 pillow, 1 blanket   SpO2 95%    BMI 27.93 kg/m   Intake/Output Summary  (Last 24 hours) at 09/24/2021 0830 Last data filed at 09/24/2021 0700 Gross per 24 hour  Intake 1213.47 ml  Output 2736 ml  Net -1522.53 ml   Weight change: -2.2 kg  Physical Exam: GEN: NAD, A&Ox3, NCAT HEENT: No conjunctival pallor, EOMI NECK: Supple, JVD present LUNGS: CTA B/L no rales, rhonchi or wheezing CV: LVAD hum ABD: SNDNT +BS  EXT: 1+ lower extremity edema and also present in the UE, UNNA boots ACCESS: rt fem temp   Imaging: ECHOCARDIOGRAM COMPLETE  Result Date: 09/23/2021    ECHOCARDIOGRAM REPORT   Patient Name:   Isaac [name] Date of Exam: 09/23/2021 Medical Rec #:  [id number]           Height:       69.0 in Accession #:    [id number]          Weight:       194.0 lb Date of Birth:  1951-04-14            BSA:          2.039 m Patient Age:    19 years            BP:           0/0 mmHg Patient Gender: M                   HR:  121 bpm. Exam Location:  Inpatient Procedure: 2D Echo, Cardiac Doppler and Color Doppler Indications:    CHF-Acute systolic  History:        Patient has prior history of Echocardiogram examinations, most                 recent 09/16/2001. CHF, CAD, Defibrillator; Arrythmias:Atrial                 Fibrillation.  Sonographer:    Clayton Lefort RDCS (AE) Referring Phys: [place] Comments: No subcostal window. HeartMate 3 @ 3149 RPM. Subcostal and SSN windows covered with bandages. IMPRESSIONS  1. Left ventricular ejection fraction, by estimation, is 20 to 25%. The left ventricle has severely decreased function. The left ventricle demonstrates global hypokinesis. There is mild left ventricular hypertrophy. Left ventricular diastolic parameters  are indeterminate. LVAD inflow cannula at apex, not well-visualized.  2. Peak RV-RA gradient 28 mmHg. Right ventricular systolic function is moderately reduced. The right ventricular size is normal.  3. Right atrial size was mildly dilated.  4. The aortic valve opens every 2nd beat. The aortic valve  is tricuspid. Aortic valve regurgitation is not visualized. No aortic stenosis is present.  5. The tricuspid valve is abnormal. Tricuspid valve regurgitation is moderate to severe.  6. The mitral valve is normal in structure. No evidence of mitral valve regurgitation. No evidence of mitral stenosis.  7. There is a left pleural effusion.  8. A small pericardial effusion is present, primarily towards the apex. FINDINGS  Left Ventricle: Left ventricular ejection fraction, by estimation, is 20 to 25%. The left ventricle has severely decreased function. The left ventricle demonstrates global hypokinesis. The left ventricular internal cavity size was normal in size. There is mild left ventricular hypertrophy. Left ventricular diastolic parameters are indeterminate. Right Ventricle: Peak RV-RA gradient 28 mmHg. The right ventricular size is normal. No increase in right ventricular wall thickness. Right ventricular systolic function is moderately reduced. Left Atrium: Left atrial size was normal in size. Right Atrium: Right atrial size was mildly dilated. Pericardium: There is a left pleural effusion. A small pericardial effusion is present. Mitral Valve: The mitral valve is normal in structure. No evidence of mitral valve regurgitation. No evidence of mitral valve stenosis. Tricuspid Valve: The tricuspid valve is abnormal. Tricuspid valve regurgitation is moderate to severe. Aortic Valve: The aortic valve opens every 2nd beat. The aortic valve is tricuspid. Aortic valve regurgitation is not visualized. No aortic stenosis is present. Pulmonic Valve: The pulmonic valve was normal in structure. Pulmonic valve regurgitation is not visualized. Aorta: The aortic root is normal in size and structure. Venous: The inferior vena cava was not well visualized. IAS/Shunts: No atrial level shunt detected by color flow Doppler. Additional Comments: A device lead is visualized in the right ventricle.  LEFT VENTRICLE PLAX 2D LVIDd:          5.10 cm LVIDs:         3.90 cm LV PW:         1.40 cm LV IVS:        1.20 cm LVOT diam:     2.00 cm LVOT Area:     3.14 cm  RIGHT VENTRICLE RV Basal diam:  3.70 cm RV Mid diam:    2.60 cm TAPSE (M-mode): 0.5 cm LEFT ATRIUM             Index        RIGHT ATRIUM  Index LA diam:        3.90 cm 1.91 cm/m   RA Area:     19.80 cm LA Vol (A2C):   42.1 ml 20.64 ml/m  RA Volume:   53.30 ml  26.13 ml/m LA Vol (A4C):   33.3 ml 16.33 ml/m LA Biplane Vol: 36.7 ml 18.00 ml/m  TRICUSPID VALVE TR Peak grad:   27.7 mmHg TR Vmax:        263.00 cm/s  SHUNTS Systemic Diam: 2.00 cm Dalton McleanMD Electronically signed by Franki Monte Signature Date/Time: 09/23/2021/3:56:29 PM    Final     Labs: BMET Recent Labs  Lab 09/19/21 4461 09/19/21 1524 09/20/21 0251 09/20/21 1946 09/21/21 9012 09/21/21 2241 09/22/21 0406 09/22/21 1464 09/22/21 1444 09/23/21 0350 09/23/21 0355 09/23/21 1709 09/24/21 0409 09/24/21 0411  NA 132*   < > 127* 133* 131*   < > 130* 132* 131* 130* 134* 132* 133*   133* 135  K 4.3   < > 3.7 4.3 4.1   < > 4.2 4.2 4.0 4.0 3.9 4.0 4.4   4.4 4.4  CL 101   < > 93* 99 96*  --  97*  --  96* 97*  --  98 100   100  --   CO2 23   < > _0 --  22  --  22 21*  --  _1 --   GLUCOSE 127*   < > 357* 77 67*  --  112*  --  125* 79  --  164* 89   91  --   BUN 36*   < > 38* 40* 45*  --  50*  --  55* 60*  --  52* 37*   38*  --   CREATININE 2.39*   < > 2.61* 3.13* 3.33*  --  4.27*  --  4.62* 5.24*  --  4.43* 3.45*   3.40*  --   CALCIUM 7.9*   < > 7.9* 8.1* 8.3*  --  8.0*  --  8.3* 8.2*  --  8.0* 8.2*   8.2*  --   PHOS 4.6  --  4.1  --  4.6  --  4.5  --   --  5.2*  --  4.3 3.0   3.0  --    < > = values in this interval not displayed.   CBC Recent Labs  Lab 09/21/21 0344 09/21/21 0356 09/22/21 0406 09/22/21 0431 09/23/21 0350 09/23/21 0355 09/24/21 0409 09/24/21 0411  WBC 16.6*  --  14.7*  --  13.1*  --  11.0*  --   NEUTROABS 14.6*  --  12.4*  --  10.7*  --  9.0*  --    HGB 8.3*   < > 8.3*   < > 8.1* 14.3 8.2* 8.8*  HCT 22.8*   < > 23.4*   < > 23.0* 42.0 23.0* 26.0*  MCV 83.2  --  84.2  --  83.6  --  82.4  --   PLT 185  --  248  --  345  --  408*  --    < > = values in this interval not displayed.    Medications:     sodium chloride   Intravenous Once   sodium chloride   Intravenous Once   aspirin  81 mg Oral Daily   bisacodyl  10 mg Oral Daily   Or   bisacodyl  10 mg Rectal  Daily   chlorhexidine gluconate (MEDLINE KIT)  15 mL Mouth Rinse BID   Chlorhexidine Gluconate Cloth  6 each Topical Daily   docusate sodium  200 mg Oral Daily   feeding supplement  237 mL Oral TID BM   insulin aspart  0-15 Units Subcutaneous Q4H   mouth rinse  15 mL Mouth Rinse BID   montelukast  5 mg Oral QHS   multivitamin with minerals  1 tablet Oral Daily   pantoprazole  40 mg Oral Daily   polyethylene glycol  17 g Oral Daily   rosuvastatin  10 mg Oral Daily   sodium chloride flush  10-40 mL Intracatheter Q12H   sodium chloride flush  3 mL Intravenous Q12H   Warfarin - Pharmacist Dosing Inpatient   Does not apply q1600      Otelia Santee, MD 09/24/2021, 8:30 AM

## 2021-09-25 ENCOUNTER — Inpatient Hospital Stay (HOSPITAL_COMMUNITY): Payer: Medicare HMO

## 2021-09-25 DIAGNOSIS — I5022 Chronic systolic (congestive) heart failure: Secondary | ICD-10-CM

## 2021-09-25 DIAGNOSIS — Z95811 Presence of heart assist device: Secondary | ICD-10-CM

## 2021-09-25 DIAGNOSIS — I5043 Acute on chronic combined systolic (congestive) and diastolic (congestive) heart failure: Secondary | ICD-10-CM | POA: Diagnosis not present

## 2021-09-25 DIAGNOSIS — R570 Cardiogenic shock: Secondary | ICD-10-CM | POA: Diagnosis not present

## 2021-09-25 LAB — CBC
HCT: 23.4 % — ABNORMAL LOW (ref 39.0–52.0)
Hemoglobin: 8.1 g/dL — ABNORMAL LOW (ref 13.0–17.0)
MCH: 29 pg (ref 26.0–34.0)
MCHC: 34.6 g/dL (ref 30.0–36.0)
MCV: 83.9 fL (ref 80.0–100.0)
Platelets: 479 10*3/uL — ABNORMAL HIGH (ref 150–400)
RBC: 2.79 MIL/uL — ABNORMAL LOW (ref 4.22–5.81)
RDW: 18.1 % — ABNORMAL HIGH (ref 11.5–15.5)
WBC: 8.7 10*3/uL (ref 4.0–10.5)
nRBC: 0 % (ref 0.0–0.2)

## 2021-09-25 LAB — COOXEMETRY PANEL
Carboxyhemoglobin: 1.6 % — ABNORMAL HIGH (ref 0.5–1.5)
Methemoglobin: 0.7 % (ref 0.0–1.5)
O2 Saturation: 56.9 %
Total hemoglobin: 11.3 g/dL — ABNORMAL LOW (ref 12.0–16.0)

## 2021-09-25 LAB — POCT I-STAT 7, (LYTES, BLD GAS, ICA,H+H)
Acid-Base Excess: 5 mmol/L — ABNORMAL HIGH (ref 0.0–2.0)
Bicarbonate: 27.8 mmol/L (ref 20.0–28.0)
Calcium, Ion: 1.14 mmol/L — ABNORMAL LOW (ref 1.15–1.40)
HCT: 25 % — ABNORMAL LOW (ref 39.0–52.0)
Hemoglobin: 8.5 g/dL — ABNORMAL LOW (ref 13.0–17.0)
O2 Saturation: 96 %
Patient temperature: 37.2
Potassium: 4.7 mmol/L (ref 3.5–5.1)
Sodium: 135 mmol/L (ref 135–145)
TCO2: 29 mmol/L (ref 22–32)
pCO2 arterial: 33.1 mmHg (ref 32.0–48.0)
pH, Arterial: 7.532 — ABNORMAL HIGH (ref 7.350–7.450)
pO2, Arterial: 74 mmHg — ABNORMAL LOW (ref 83.0–108.0)

## 2021-09-25 LAB — GLUCOSE, CAPILLARY
Glucose-Capillary: 84 mg/dL (ref 70–99)
Glucose-Capillary: 97 mg/dL (ref 70–99)
Glucose-Capillary: 98 mg/dL (ref 70–99)
Glucose-Capillary: 98 mg/dL (ref 70–99)

## 2021-09-25 LAB — RENAL FUNCTION PANEL
Albumin: 2 g/dL — ABNORMAL LOW (ref 3.5–5.0)
Albumin: 1.9 g/dL — ABNORMAL LOW (ref 3.5–5.0)
Anion gap: 6 (ref 5–15)
Anion gap: 5 (ref 5–15)
BUN: 21 mg/dL (ref 8–23)
BUN: 20 mg/dL (ref 8–23)
CO2: 25 mmol/L (ref 22–32)
CO2: 25 mmol/L (ref 22–32)
Calcium: 8.2 mg/dL — ABNORMAL LOW (ref 8.9–10.3)
Calcium: 7.6 mg/dL — ABNORMAL LOW (ref 8.9–10.3)
Chloride: 101 mmol/L (ref 98–111)
Chloride: 100 mmol/L (ref 98–111)
Creatinine, Ser: 2.38 mg/dL — ABNORMAL HIGH (ref 0.61–1.24)
Creatinine, Ser: 2.19 mg/dL — ABNORMAL HIGH (ref 0.61–1.24)
GFR, Estimated: 29 mL/min — ABNORMAL LOW (ref >60)
GFR, Estimated: 32 mL/min — ABNORMAL LOW (ref >60)
Glucose, Bld: 91 mg/dL (ref 70–99)
Glucose, Bld: 127 mg/dL — ABNORMAL HIGH (ref 70–99)
Phosphorus: 2 mg/dL — ABNORMAL LOW (ref 2.5–4.6)
Phosphorus: 2.8 mg/dL (ref 2.5–4.6)
Potassium: 4.7 mmol/L (ref 3.5–5.1)
Potassium: 4.4 mmol/L (ref 3.5–5.1)
Sodium: 132 mmol/L — ABNORMAL LOW (ref 135–145)
Sodium: 130 mmol/L — ABNORMAL LOW (ref 135–145)

## 2021-09-25 LAB — PROCALCITONIN
Procalcitonin: 3.79 ng/mL
Procalcitonin: 3.3 ng/mL

## 2021-09-25 LAB — PROTIME-INR
INR: 2.1 — ABNORMAL HIGH (ref 0.8–1.2)
Prothrombin Time: 23.1 seconds — ABNORMAL HIGH (ref 11.4–15.2)

## 2021-09-25 LAB — LACTATE DEHYDROGENASE: LDH: 380 U/L — ABNORMAL HIGH (ref 98–192)

## 2021-09-25 LAB — MAGNESIUM: Magnesium: 2.4 mg/dL (ref 1.7–2.4)

## 2021-09-25 MED ORDER — ACETAMINOPHEN 500 MG PO TABS
500.0000 mg | ORAL_TABLET | Freq: Four times a day (QID) | ORAL | Status: DC
  Administered 2021-09-25 – 2021-10-07 (×30): 500 mg via ORAL
  Filled 2021-09-25 (×28): qty 1

## 2021-09-25 MED ORDER — SODIUM PHOSPHATES 45 MMOLE/15ML IV SOLN
20.0000 mmol | Freq: Once | INTRAVENOUS | Status: AC
  Administered 2021-09-25: 20 mmol via INTRAVENOUS
  Filled 2021-09-25: qty 6.67

## 2021-09-25 MED ORDER — RENA-VITE PO TABS
1.0000 | ORAL_TABLET | Freq: Every day | ORAL | Status: DC
  Administered 2021-09-25 – 2021-10-07 (×13): 1 via ORAL
  Filled 2021-09-25 (×13): qty 1

## 2021-09-25 MED ORDER — VANCOMYCIN HCL 1500 MG/300ML IV SOLN
1500.0000 mg | Freq: Once | INTRAVENOUS | Status: AC
  Administered 2021-09-25: 1500 mg via INTRAVENOUS
  Filled 2021-09-25: qty 300

## 2021-09-25 MED ORDER — MIDODRINE HCL 5 MG PO TABS
5.0000 mg | ORAL_TABLET | Freq: Three times a day (TID) | ORAL | Status: DC
  Administered 2021-09-25 – 2021-09-26 (×4): 5 mg via ORAL
  Filled 2021-09-25 (×4): qty 1

## 2021-09-25 MED ORDER — SODIUM CHLORIDE 0.9 % IV SOLN
2.0000 g | Freq: Two times a day (BID) | INTRAVENOUS | Status: DC
  Administered 2021-09-25 – 2021-09-29 (×10): 2 g via INTRAVENOUS
  Filled 2021-09-25 (×13): qty 2

## 2021-09-25 MED ORDER — VANCOMYCIN HCL IN DEXTROSE 1-5 GM/200ML-% IV SOLN: 1000.0000 mg | INTRAVENOUS | Status: DC

## 2021-09-25 MED ORDER — WARFARIN SODIUM 1 MG PO TABS
1.0000 mg | ORAL_TABLET | Freq: Once | ORAL | Status: AC
  Administered 2021-09-25: 1 mg via ORAL
  Filled 2021-09-25: qty 1

## 2021-09-25 MED ORDER — PROSOURCE PLUS PO LIQD
30.0000 mL | Freq: Three times a day (TID) | ORAL | Status: DC
  Administered 2021-09-25 – 2021-10-03 (×11): 30 mL via ORAL
  Filled 2021-09-25 (×14): qty 30

## 2021-09-25 MED FILL — Electrolyte-R (PH 7.4) Solution: INTRAVENOUS | Qty: 3000 | Status: AC

## 2021-09-25 MED FILL — Mannitol IV Soln 20%: INTRAVENOUS | Qty: 500 | Status: AC

## 2021-09-25 MED FILL — Calcium Chloride Inj 10%: INTRAVENOUS | Qty: 10 | Status: AC

## 2021-09-25 MED FILL — Sodium Bicarbonate IV Soln 8.4%: INTRAVENOUS | Qty: 50 | Status: AC

## 2021-09-25 MED FILL — Heparin Sodium (Porcine) Inj 1000 Unit/ML: INTRAMUSCULAR | Qty: 20 | Status: AC

## 2021-09-25 NOTE — Progress Notes (Signed)
[facility] Progress Note   71 y.o. 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 Impella was placed on 09/12/2021 and then HM3 on 12/28. CPB time was 161 minutes.  Creatinine was 2.02 12/09/2015 and BL ~1.6-2.    Assessment/ Plan:   Acute kidney injury on CKD3b with a baseline cr in the 1.6-2 range. Acute component either from cardiorenal +/- ATN with decreased renal function from hemodynamics and CPB + hypotensive episodes + pressors. No absolute indication for RRT but he is heading in that direction which I di/w the spouse who was bedside. Weights have increased from 78.4kg to 86.3kg which is c/w worsening edema. CVP ~12. Failed Lasix challenge and CRRT started 1/3. - I personally reviewed the urine microscopy and c/w ATN.  Seen on CRRT w/ 4K baths pre/post/dialysate; CRRT started on 1/3. Net neg 1.5L/2L per 24hr period since CRRT started.  Tolerating UF -> 50-100 ml/hr; will try to avoid hypotensive episodes which will prolong recovery. FIlter clotted 1/5. CVP 16. Can incr to 50-150 range as tolerated unless hemodynamics worsen.  Rt fem cath  -Cont monitoring daily I/Os + weights  -Maintain MAP>65 for optimal renal perfusion.  -Avoid nephrotoxic medications including NSAIDs, contrast. - Dose medications for a GFR <15 ml/min and also on CRRT.   HFrEF with underlying CM s/p HM3 with CVP in the 12 range with worsening edema despite Lasix but unfortunately renal function worsening. On Milrinone, epinephrine + Levophed.  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  Anemia Afib  Subjective:   CVP 16 CRRT which he is tolerating. States that his breathing is more comfortable now. Filter clotted but he is back on. Off Levo now. Co-Ox 57%, temp as well.   Objective:   BP 92/68    Pulse (!)  105    Temp 99.9 F (37.7 C)    Resp (!) 21    Ht $R'5\' 9"'Hs$  (1.753 m)    Wt 87.2 kg    SpO2 94%    BMI 28.39 kg/m   Intake/Output Summary (Last 24 hours) at 09/25/2021 0954 Last data filed at 09/25/2021 0900 Gross per 24 hour  Intake 1584.59 ml  Output 3603 ml  Net -2018.41 ml   Weight change: 1.4 kg  Physical Exam: GEN: NAD, A&Ox3, NCAT HEENT: No conjunctival pallor, EOMI NECK: Supple, JVD present LUNGS: CTA B/L no rales, rhonchi or wheezing CV: LVAD hum ABD: SNDNT +BS  EXT: 1+ lower extremity edema and also present in the UE, UNNA boots ACCESS: rt fem temp   Imaging: DG CHEST PORT 1 VIEW  Result Date: 09/25/2021 CLINICAL DATA:  Fever, difficulty breathing EXAM: PORTABLE CHEST 1 VIEW COMPARISON:  Previous studies including the examination of 09/21/2021 FINDINGS: Transverse diameter of heart is increased. Central pulmonary vessels are more prominent. There is increased opacification of left lower lung fields. There is interval removal of left chest tube. Tip of PICC line is seen in the region of right superior vena cava. Pacemaker/defibrillator battery is seen in the left infraclavicular region. IMPRESSION: Cardiomegaly. Central pulmonary vessels are more prominent. Increased interstitial markings are seen in parahilar regions and lower lung fields suggesting interstitial edema. There is increased density in the left lower lung fields suggesting increase in pleural effusion and possibly underlying atelectasis/pneumonia. Electronically Signed   By: Elmer Picker  M.D.   On: 09/25/2021 08:57   ECHOCARDIOGRAM COMPLETE  Result Date: 09/23/2021    ECHOCARDIOGRAM REPORT   Patient Name:   Isaac [name] Date of Exam: 09/23/2021 Medical Rec #:  [id number]           Height:       69.0 in Accession #:    [id number]          Weight:       194.0 lb Date of Birth:  [birth year]            BSA:          2.039 m Patient Age:    39 years            BP:           0/0 mmHg Patient Gender: M                    HR:           121 bpm. Exam Location:  Inpatient Procedure: 2D Echo, Cardiac Doppler and Color Doppler Indications:    CHF-Acute systolic  History:        Patient has prior history of Echocardiogram examinations, most                 recent 09/16/2001. CHF, CAD, Defibrillator; Arrythmias:Atrial                 Fibrillation.  Sonographer:    Clayton Lefort RDCS (AE) Referring Phys: [place] Comments: No subcostal window. HeartMate 3 @ 1829 RPM. Subcostal and SSN windows covered with bandages. IMPRESSIONS  1. Left ventricular ejection fraction, by estimation, is 20 to 25%. The left ventricle has severely decreased function. The left ventricle demonstrates global hypokinesis. There is mild left ventricular hypertrophy. Left ventricular diastolic parameters  are indeterminate. LVAD inflow cannula at apex, not well-visualized.  2. Peak RV-RA gradient 28 mmHg. Right ventricular systolic function is moderately reduced. The right ventricular size is normal.  3. Right atrial size was mildly dilated.  4. The aortic valve opens every 2nd beat. The aortic valve is tricuspid. Aortic valve regurgitation is not visualized. No aortic stenosis is present.  5. The tricuspid valve is abnormal. Tricuspid valve regurgitation is moderate to severe.  6. The mitral valve is normal in structure. No evidence of mitral valve regurgitation. No evidence of mitral stenosis.  7. There is a left pleural effusion.  8. A small pericardial effusion is present, primarily towards the apex. FINDINGS  Left Ventricle: Left ventricular ejection fraction, by estimation, is 20 to 25%. The left ventricle has severely decreased function. The left ventricle demonstrates global hypokinesis. The left ventricular internal cavity size was normal in size. There is mild left ventricular hypertrophy. Left ventricular diastolic parameters are indeterminate. Right Ventricle: Peak RV-RA gradient 28 mmHg. The right ventricular size is normal. No  increase in right ventricular wall thickness. Right ventricular systolic function is moderately reduced. Left Atrium: Left atrial size was normal in size. Right Atrium: Right atrial size was mildly dilated. Pericardium: There is a left pleural effusion. A small pericardial effusion is present. Mitral Valve: The mitral valve is normal in structure. No evidence of mitral valve regurgitation. No evidence of mitral valve stenosis. Tricuspid Valve: The tricuspid valve is abnormal. Tricuspid valve regurgitation is moderate to severe. Aortic Valve: The aortic valve opens every 2nd beat. The aortic valve is tricuspid. Aortic valve regurgitation is not visualized. No aortic stenosis is  present. Pulmonic Valve: The pulmonic valve was normal in structure. Pulmonic valve regurgitation is not visualized. Aorta: The aortic root is normal in size and structure. Venous: The inferior vena cava was not well visualized. IAS/Shunts: No atrial level shunt detected by color flow Doppler. Additional Comments: A device lead is visualized in the right ventricle.  LEFT VENTRICLE PLAX 2D LVIDd:         5.10 cm LVIDs:         3.90 cm LV PW:         1.40 cm LV IVS:        1.20 cm LVOT diam:     2.00 cm LVOT Area:     3.14 cm  RIGHT VENTRICLE RV Basal diam:  3.70 cm RV Mid diam:    2.60 cm TAPSE (M-mode): 0.5 cm LEFT ATRIUM             Index        RIGHT ATRIUM           Index LA diam:        3.90 cm 1.91 cm/m   RA Area:     19.80 cm LA Vol (A2C):   42.1 ml 20.64 ml/m  RA Volume:   53.30 ml  26.13 ml/m LA Vol (A4C):   33.3 ml 16.33 ml/m LA Biplane Vol: 36.7 ml 18.00 ml/m  TRICUSPID VALVE TR Peak grad:   27.7 mmHg TR Vmax:        263.00 cm/s  SHUNTS Systemic Diam: 2.00 cm Dalton McleanMD Electronically signed by Franki Monte Signature Date/Time: 09/23/2021/3:56:29 PM    Final     Labs: BMET Recent Labs  Lab 09/21/21 3606 09/21/21 7703 09/22/21 0406 09/22/21 0431 09/22/21 1444 09/23/21 0350 09/23/21 0355 09/23/21 1709  09/24/21 0409 09/24/21 0411 09/24/21 1631 09/25/21 0336 09/25/21 0340  NA 131*   < > 130*   < > 131* 130* 134* 132* 133*   133* 135 133* 132* 135  K 4.1   < > 4.2   < > 4.0 4.0 3.9 4.0 4.4   4.4 4.4 4.5 4.7 4.7  CL 96*  --  97*  --  96* 97*  --  98 100   100  --  100 101  --   CO2 23  --  22  --  22 21*  --  _0 --  25 25  --   GLUCOSE 67*  --  112*  --  125* 79  --  164* 89   91  --  125* 91  --   BUN 45*  --  50*  --  55* 60*  --  52* 37*   38*  --  28* 21  --   CREATININE 3.33*  --  4.27*  --  4.62* 5.24*  --  4.43* 3.45*   3.40*  --  2.64* 2.38*  --   CALCIUM 8.3*  --  8.0*  --  8.3* 8.2*  --  8.0* 8.2*   8.2*  --  8.0* 8.2*  --   PHOS 4.6  --  4.5  --   --  5.2*  --  4.3 3.0   3.0  --  2.2* 2.0*  --    < > = values in this interval not displayed.   [place] Recent Labs  Lab 09/21/21 0344 09/21/21 0356 09/22/21 0406 09/22/21 0431 09/23/21 0350 09/23/21 0355 09/24/21 4035 09/24/21 0411 09/25/21 2481 09/25/21 0340  WBC 16.6*  --  14.7*  --  13.1*  --  11.0*  --  8.7  --   NEUTROABS 14.6*  --  12.4*  --  10.7*  --  9.0*  --   --   --   HGB 8.3*   < > 8.3*   < > 8.1*   < > 8.2* 8.8* 8.1* 8.5*  HCT 22.8*   < > 23.4*   < > 23.0*   < > 23.0* 26.0* 23.4* 25.0*  MCV 83.2  --  84.2  --  83.6  --  82.4  --  83.9  --   PLT 185  --  248  --  345  --  408*  --  479*  --    < > = values in this interval not displayed.    Medications:     sodium chloride   Intravenous Once   sodium chloride   Intravenous Once   acetaminophen  500 mg Oral Q6H   aspirin  81 mg Oral Daily   bisacodyl  10 mg Oral Daily   Or   bisacodyl  10 mg Rectal Daily   chlorhexidine gluconate (MEDLINE KIT)  15 mL Mouth Rinse BID   Chlorhexidine Gluconate Cloth  6 each Topical Daily   docusate sodium  200 mg Oral Daily   feeding supplement  237 mL Oral TID BM   mouth rinse  15 mL Mouth Rinse BID   midodrine  5 mg Oral TID WC   montelukast  5 mg Oral QHS   multivitamin with minerals  1 tablet Oral Daily    pantoprazole  40 mg Oral Daily   polyethylene glycol  17 g Oral Daily   rosuvastatin  10 mg Oral Daily   sodium chloride flush  10-40 mL Intracatheter Q12H   sodium chloride flush  3 mL Intravenous Q12H   warfarin  1 mg Oral ONCE-1600   Warfarin - Pharmacist Dosing Inpatient   Does not apply q1600      Otelia Santee, MD 09/25/2021, 9:54 AM

## 2021-09-25 NOTE — Progress Notes (Addendum)
Patient ID: Isaac [name], male   DOB: 06-Aug-1951, 71 y.o.   MRN: [id number] HeartMate 3 Rounding Note  Subjective:    No complaints this am. Slept fairly well, sitting up in chair. Ambulated 190 ft yesterday. Only took liquids yesterday but had 2 Ensures and a protein smoothie from wife. Will try solids today. Pain under good control.  Co-0x 57% on milrinone 0.25, epi 5, off NE.  CVP 13/  LVAD INTERROGATION:  HeartMate IIl LVAD:  Flow 5.1 liters/min, speed 5500, power 4.1, PI 2.6.    Objective:    Vital Signs:   Temp:  [97.7 F (36.5 C)-99.7 F (37.6 C)] 99.3 F (37.4 C) (01/05 0700) Pulse Rate:  [108-239] 123 (01/05 0700) Resp:  [14-29] 26 (01/05 0700) BP: (90-123)/(68-95) 92/68 (01/05 0700) SpO2:  [87 %-100 %] 96 % (01/05 0700) Arterial Line BP: (65-136)/(46-106) 87/67 (01/05 0700) Weight:  [87.2 kg] 87.2 kg (01/05 0500) Last BM Date: 09/24/21 Mean arterial Pressure 77.   Intake/Output:   Intake/Output Summary (Last 24 hours) at 09/25/2021 0738 Last data filed at 09/25/2021 0700 Gross per 24 hour  Intake 1832.21 ml  Output 3647 ml  Net -1814.79 ml     Physical Exam: General:  Well appearing. No resp difficulty HEENT: normal Neck: Normal Cor: Distant heart sounds with LVAD hum present. Lungs: clear Abdomen: soft, nontender, nondistended. Good bowel sounds. Extremities: moderate diffuse edema Neuro: alert & orientedx3, moves all 4 extremities w/o difficulty. Affect pleasant  Telemetry: sinus tachy 105 on IV amio  Labs: Basic Metabolic Panel: Recent Labs  Lab 09/21/21 0344 09/21/21 0356 09/22/21 0406 09/22/21 0431 09/23/21 0350 09/23/21 0355 09/23/21 1709 09/24/21 0409 09/24/21 0411 09/24/21 1631 09/25/21 0336 09/25/21 0340  NA 131*   < > 130*   < > 130*   < > 132* 133*   133* 135 133* 132* 135  K 4.1   < > 4.2   < > 4.0   < > 4.0 4.4   4.4 4.4 4.5 4.7 4.7  CL 96*  --  97*   < > 97*  --  98 100   100  --  100 101  --   CO2 23  --  22   < > 21*  --   22 23   24   --  25 25  --   GLUCOSE 67*  --  112*   < > 79  --  164* 89   91  --  125* 91  --   BUN 45*  --  50*   < > 60*  --  52* 37*   38*  --  28* 21  --   CREATININE 3.33*  --  4.27*   < > 5.24*  --  4.43* 3.45*   3.40*  --  2.64* 2.38*  --   CALCIUM 8.3*  --  8.0*   < > 8.2*  --  8.0* 8.2*   8.2*  --  8.0* 8.2*  --   MG 2.3  --  2.3  --  2.6*  --   --  2.5*  --   --  2.4  --   PHOS 4.6  --  4.5  --  5.2*  --  4.3 3.0   3.0  --  2.2* 2.0*  --    < > = values in this interval not displayed.    Liver Function Tests: Recent Labs  Lab 09/19/21 0358 09/20/21 0251 09/23/21 1709 09/24/21 0409 09/24/21  1631 09/25/21 0336  AST 113* 86*  --  51*  --   --   ALT 9 5  --  <5  --   --   ALKPHOS 67 74  --  154*  --   --   BILITOT 4.3* 4.7*  --  3.2*  --   --   PROT 4.5* 4.8*  --  5.8*  --   --   ALBUMIN 2.3* 2.3* 2.0* 2.0*   1.9* 1.9* 2.0*   No results for input(s): LIPASE, AMYLASE in the last 168 hours. Recent Labs  Lab 09/20/21 0701  AMMONIA <10    CBC: Recent Labs  Lab 09/20/21 0251 09/20/21 1709 09/21/21 0344 09/21/21 0356 09/22/21 0406 09/22/21 0431 09/23/21 0350 09/23/21 0355 09/24/21 0409 09/24/21 0411 09/25/21 0336 09/25/21 0340  WBC 16.7*   < > 16.6*  --  14.7*  --  13.1*  --  11.0*  --  8.7  --   NEUTROABS 14.4*  --  14.6*  --  12.4*  --  10.7*  --  9.0*  --   --   --   HGB 7.9*   < > 8.3*   < > 8.3*   < > 8.1* 14.3 8.2* 8.8* 8.1* 8.5*  HCT 21.5*   < > 22.8*   < > 23.4*   < > 23.0* 42.0 23.0* 26.0* 23.4* 25.0*  MCV 85.0   < > 83.2  --  84.2  --  83.6  --  82.4  --  83.9  --   PLT 121*   < > 185  --  248  --  345  --  408*  --  479*  --    < > = values in this interval not displayed.    INR: Recent Labs  Lab 09/21/21 0344 09/22/21 0406 09/23/21 0350 09/24/21 0409 09/25/21 0336  INR 1.4* 1.9* 2.8* 2.9* 2.1*    Other results: EKG:   Imaging: ECHOCARDIOGRAM COMPLETE  Result Date: 09/23/2021    ECHOCARDIOGRAM REPORT   Patient Name:   ADAMA FERBER Date of Exam: 09/23/2021 Medical Rec #:  [id number]           Height:       69.0 in Accession #:    [id number]          Weight:       194.0 lb Date of Birth:  08/03/1951            BSA:          2.039 m Patient Age:    57 years            BP:           0/0 mmHg Patient Gender: M                   HR:           121 bpm. Exam Location:  Inpatient Procedure: 2D Echo, Cardiac Doppler and Color Doppler Indications:    CHF-Acute systolic  History:        Patient has prior history of Echocardiogram examinations, most                 recent 09/16/2001. CHF, CAD, Defibrillator; Arrythmias:Atrial                 Fibrillation.  Sonographer:    Clayton Lefort RDCS (AE) Referring Phys: [place] Comments: No subcostal window. HeartMate  3 @ 5500 RPM. Subcostal and SSN windows covered with bandages. IMPRESSIONS  1. Left ventricular ejection fraction, by estimation, is 20 to 25%. The left ventricle has severely decreased function. The left ventricle demonstrates global hypokinesis. There is mild left ventricular hypertrophy. Left ventricular diastolic parameters  are indeterminate. LVAD inflow cannula at apex, not well-visualized.  2. Peak RV-RA gradient 28 mmHg. Right ventricular systolic function is moderately reduced. The right ventricular size is normal.  3. Right atrial size was mildly dilated.  4. The aortic valve opens every 2nd beat. The aortic valve is tricuspid. Aortic valve regurgitation is not visualized. No aortic stenosis is present.  5. The tricuspid valve is abnormal. Tricuspid valve regurgitation is moderate to severe.  6. The mitral valve is normal in structure. No evidence of mitral valve regurgitation. No evidence of mitral stenosis.  7. There is a left pleural effusion.  8. A small pericardial effusion is present, primarily towards the apex. FINDINGS  Left Ventricle: Left ventricular ejection fraction, by estimation, is 20 to 25%. The left ventricle has severely decreased function. The left  ventricle demonstrates global hypokinesis. The left ventricular internal cavity size was normal in size. There is mild left ventricular hypertrophy. Left ventricular diastolic parameters are indeterminate. Right Ventricle: Peak RV-RA gradient 28 mmHg. The right ventricular size is normal. No increase in right ventricular wall thickness. Right ventricular systolic function is moderately reduced. Left Atrium: Left atrial size was normal in size. Right Atrium: Right atrial size was mildly dilated. Pericardium: There is a left pleural effusion. A small pericardial effusion is present. Mitral Valve: The mitral valve is normal in structure. No evidence of mitral valve regurgitation. No evidence of mitral valve stenosis. Tricuspid Valve: The tricuspid valve is abnormal. Tricuspid valve regurgitation is moderate to severe. Aortic Valve: The aortic valve opens every 2nd beat. The aortic valve is tricuspid. Aortic valve regurgitation is not visualized. No aortic stenosis is present. Pulmonic Valve: The pulmonic valve was normal in structure. Pulmonic valve regurgitation is not visualized. Aorta: The aortic root is normal in size and structure. Venous: The inferior vena cava was not well visualized. IAS/Shunts: No atrial level shunt detected by color flow Doppler. Additional Comments: A device lead is visualized in the right ventricle.  LEFT VENTRICLE PLAX 2D LVIDd:         5.10 cm LVIDs:         3.90 cm LV PW:         1.40 cm LV IVS:        1.20 cm LVOT diam:     2.00 cm LVOT Area:     3.14 cm  RIGHT VENTRICLE RV Basal diam:  3.70 cm RV Mid diam:    2.60 cm TAPSE (M-mode): 0.5 cm LEFT ATRIUM             Index        RIGHT ATRIUM           Index LA diam:        3.90 cm 1.91 cm/m   RA Area:     19.80 cm LA Vol (A2C):   42.1 ml 20.64 ml/m  RA Volume:   53.30 ml  26.13 ml/m LA Vol (A4C):   33.3 ml 16.33 ml/m LA Biplane Vol: 36.7 ml 18.00 ml/m  TRICUSPID VALVE TR Peak grad:   27.7 mmHg TR Vmax:        263.00 cm/s  SHUNTS  Systemic Diam: 2.00 cm [employer] Electronically signed by Kirk Ruths  McleanMD Signature Date/Time: 09/23/2021/3:56:29 PM    Final      Medications:     Scheduled Medications:  sodium chloride   Intravenous Once   sodium chloride   Intravenous Once   aspirin  81 mg Oral Daily   bisacodyl  10 mg Oral Daily   Or   bisacodyl  10 mg Rectal Daily   chlorhexidine gluconate (MEDLINE KIT)  15 mL Mouth Rinse BID   Chlorhexidine Gluconate Cloth  6 each Topical Daily   docusate sodium  200 mg Oral Daily   feeding supplement  237 mL Oral TID BM   mouth rinse  15 mL Mouth Rinse BID   montelukast  5 mg Oral QHS   multivitamin with minerals  1 tablet Oral Daily   pantoprazole  40 mg Oral Daily   polyethylene glycol  17 g Oral Daily   rosuvastatin  10 mg Oral Daily   sodium chloride flush  10-40 mL Intracatheter Q12H   sodium chloride flush  3 mL Intravenous Q12H   Warfarin - Pharmacist Dosing Inpatient   Does not apply q1600    Infusions:   prismasol BGK 4/2.5 400 mL/hr at 09/25/21 0328    prismasol BGK 4/2.5 200 mL/hr at 09/24/21 1500   sodium chloride Stopped (09/20/21 0304)   sodium chloride Stopped (09/18/21 0803)   amiodarone 30 mg/hr (09/25/21 0700)   epinephrine 5 mcg/min (09/25/21 0700)   lactated ringers     milrinone 0.25 mcg/kg/min (09/25/21 0700)   norepinephrine (LEVOPHED) Adult infusion Stopped (09/25/21 0453)   prismasol BGK 4/2.5 1,800 mL/hr at 09/25/21 0455    PRN Medications: sodium chloride, albuterol, heparin, heparin, lip balm, melatonin, morphine injection, ondansetron (ZOFRAN) IV, oxyCODONE, polyethylene glycol, sodium chloride flush, sodium chloride flush, sorbitol, traMADol   Assessment:   POD 7 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 Moderate RV systolic dysfunction by echo preop but looked better after LVAD insertion in OR. 2.   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 Decided against removal of ICD/pacer and TV replacement. Improved a little at the end of surgery. 3.  Hx of atrial fib on amio. Has been in and out postop. 4.   Stage 3 CKD with progressive rise in creat up to 5.24 with oliguria, marked volume overload and no response to lasix.   Preop was as low as 1.49 and then increased to 1.7 before surgery after large diuresis the day before. CRRT started for volume removal. Removing 100cc/hr. -1800 yesterday. Wt 19 lbs over preop if accurate. 5.   CAD with PCI of OM1 in 2007 and RCA in 2013. 6.   Acute postop blood loss anemia. stable.  Plan/Discussion:    He has been hemodynamically stable and NE weaned off.  Could consider starting some midodrine to support BP. Continue milrinone and epi for now.  INR down to 2.1. Will remove pacing wires today. Pharmacy managing Coumadin. Would give a small dose of Coumadin today.  Continue CRRT for gentle volume removal. Continues to make small amount of urine.  Hopefully ATN will resolve with return of adequate kidney function.  Hypophosphatemia: replace per pharmacy.  Continue to encourage nutrition, IS, ambulation.  I reviewed the LVAD parameters from today, and compared the results to the patient's prior recorded data.  No programming changes were made.  The LVAD is functioning within specified parameters.  LVAD interrogation was negative for any significant power changes, alarms or PI events/speed drops.  LVAD equipment check completed and is in good working order.  Back-up equipment present.     Length of Stay: [address]  Fernande Boyden [facility] 09/25/2021, 7:38 AM

## 2021-09-25 NOTE — Progress Notes (Addendum)
I was made aware of difficulty maintaining MAPs of 70s during CRRT this am.   NE added back currently at 4. On 6 Epi + .125 milrinone.   Now pulling even on CRRT.  Midodrine started this am.   Still volume up. CVP 12-13. Discussed with Dr. Aundra Dubin. Can go up on NE as needed to allow pulling 50/hr on CRRT.    ADDENDUM: Procalcitonin > 150. Recheck. T max 100.2 this am. Still with low grade temp this afternoon.  Cultures pending Chest x-ray with left lower lung density, possibly pneumonia Will treat empirically with vancomycin and cefepime

## 2021-09-25 NOTE — Progress Notes (Signed)
LVAD Coordinator Rounding Note:  Admitted 09/06/21 due to CHF exacerbation to Dr Claris Gladden service. He was started on Milrinone on admission. Febrile n 09/10/21- suspected to have UTI- antibiotics started. Impella 5.5 placed 09/12/21.   HM III LVAD implanted on 09/17/21 by Dr Cyndia Bent under destination therapy criteria. 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   Pt up in the chair, has already walked this morning. Pt tells me that he is cold.   CVVH ongoing.  Remains in afib on Amiodarone gtt. Two short runs NSVT overnight.   VAD coordinator spoke with Mrs. Baysinger today. She plans to be in the pts room at 0830 in the morning to observe dressing change.  Vital signs: Temp: 99.5 HR: 101 afib Doppler Pressure: 70 Radial Art Line: 94/74 (82) O2 Sat: 92% on 4L [place] Wt: 185.2>196.8>194>189.1>192.2 lbs    LVAD interrogation reveals:  Speed: 5500 Flow: 5 Power: 4.1w PI: 2.3 Hematocrit: 25  Alarms: none Events: none  Fixed speed: 5500 Low speed limit: 5200  Drive Line: Existing VAD dressing removed and site care performed using sterile technique. Drive line exit site cleaned with Chlora prep applicators x 2, allowed to dry, and gauze dressing with silver strip applied. Exit site not incorporated, the velour is fully implanted at exit site. 1 suture in place. Small amount of bloody drainage noted at site and on gauze. Slight redness, no tenderness, foul odor or rash noted. Drive line anchor secure x 2. Daily dressing changes per Nurse Davonna Belling or [place] coordinator. Next dressing change due: 09/26/21.        Labs:  LDH trend: 344>379>370>378>380  INR trend: 1.6>1.8>2.8>2.9>2.1  WBC trend: 14.1>13.6>13.1>11>8.7  Cr trend: 2.11>2.39>5.24>3.45>2.38  Hgb trend: 6.5>7.5>8.1>8.8  Anticoagulation Plan: -INR Goal: 2.0 - 2.5 -ASA Dose: 81 mg daily   Blood Products:  Intra Op: 09/17/21  1 PLT  4 FFP   4  PRBC  Cellsaver- 803 cc Post Op:  - 09/18/21>> 3 PRBCs - 09/19/21>> 1 PRBC  Device: - [employer] dual -Therapies: OFF  *Will need to be referred to device clinic at discharge*  Arrythmias: 3 episodes of VT that required defib while in OR 09/17/21. Currently on Amiodarone gtt. Hx: afib.   Respiratory: extubated 09/18/21  Infection:  09/10/21>> blood cultures >> No growth 5 days; final 09/12/21>> urine culture >> No growth; final   Drips:  Amiodarone 30 mg/hr Epinephrine 6 mcg/min Milrinone 0.125 mcg/kg/min Levophed 4 mcg/min Vasopressin 0.04-- off   Patient Education: Provided pt's wife with HM III Patient Manual as requested.  Will plan to start dressing change education with pt's wife this week. She observed dressing change last week.  Pt has been changing power sources. Pt educated on the need for mask and hat during sterile dressing change today.  Plan/Recommendations:  1. [place] VAD coordinator for drive line or equipment concerns 2. Daily drive line dressing change per Nurse Davonna Belling or [place] coordinator  [name] RN [place] Coordinator  Office: [phone]  24/7 Pager: [phone]

## 2021-09-25 NOTE — Progress Notes (Signed)
Pharmacy Antibiotic Note  Isaac [name] is a 71 y.o. male admitted on 09/06/2021 with CHF s/p LVAD implant. Pt noted to have low-grade temp (on CRRT) with elevated procal and possible opacity on CXR. Pharmacy has been consulted for vancomycin and cefepime dosing. Cultures pending.  Plan: Vancomycin 1500mg  IV x1 then 1000mg  IV q24h Cefepime 2g IV q12h  Height: 5\' 9"  (175.3 cm) Weight: 87.2 kg (192 lb 3.9 oz) IBW/kg (Calculated) : 70.7  Temp (24hrs), Avg:99 F (37.2 C), Min:97.7 F (36.5 C), Max:100.2 F (37.9 C)  Recent Labs  Lab 09/21/21 0344 09/22/21 0406 09/22/21 1444 09/23/21 0350 09/23/21 1709 09/24/21 0409 09/24/21 1631 09/25/21 0336  WBC 16.6* 14.7*  --  13.1*  --  11.0*  --  8.7  CREATININE 3.33* 4.27*   < > 5.24* 4.43* 3.45*   3.40* 2.64* 2.38*   < > = values in this interval not displayed.    Estimated Creatinine Clearance: 32 mL/min (A) (by C-G formula based on SCr of 2.38 mg/dL (H)).    Allergies  Allergen Reactions   Mushroom Extract Complex Nausea And Vomiting   Neosporin [Neomycin-Bacitracin Zn-Polymyx] Hives    Antimicrobials this admission: Vancomycin 1/5 >> Cefepime 1/5 >>  Microbiology: 1/5 BCx: pending  Arrie Senate, PharmD, BCPS, [facility] Clinical Pharmacist [phone] Please check AMION for all [facility] Pharmacy numbers 09/25/2021

## 2021-09-25 NOTE — Progress Notes (Signed)
Patient ID: Isaac [name], male   DOB: Feb 08, 1951, 71 y.o.   MRN: [id number]  TCTS Evening Rounds:  Tmax 99.9 this am. Procalcitonin 3. BC drawn today and started on Vanc and Maxepime for possible infection. CXR today shows left pleural effusion that has developed since chest tube removed last weekend. There is associated LLL atelectasis that may be the cause of low grade temp. His WBC is normal. I would be very careful with vanc in this patient who has acute on chronic renal failure. We need to avoid any potential nephrotoxin unless there is a clear sign of infection. He needs to get volume off and would probably benefit from left thoracentesis.  He is only -132 cc today.  VAD parameters all stable.  He ambulated around the ICU.  BMET    Component Value Date/Time   NA 130 (L) 09/25/2021 1535   NA 141 07/06/2014 1425   K 4.4 09/25/2021 1535   K 3.6 07/06/2014 1425   CL 100 09/25/2021 1535   CL 110 (H) 07/06/2014 1425   CO2 25 09/25/2021 1535   CO2 30 07/06/2014 1425   GLUCOSE 127 (H) 09/25/2021 1535   GLUCOSE 93 07/06/2014 1425   BUN 20 09/25/2021 1535   BUN 15 07/06/2014 1425   CREATININE 2.19 (H) 09/25/2021 1535   CREATININE 1.45 (H) 07/06/2014 1425   CALCIUM 7.6 (L) 09/25/2021 1535   CALCIUM 8.5 07/06/2014 1425   GFRNONAA 32 (L) 09/25/2021 1535   GFRNONAA 52 (L) 07/06/2014 1425

## 2021-09-25 NOTE — Progress Notes (Signed)
[place] for Warfarin  Indication: LVAD/AF  Allergies  Allergen Reactions   Mushroom Extract Complex Nausea And Vomiting   Neosporin [Neomycin-Bacitracin Zn-Polymyx] Hives    Patient Measurements: Height: 5\' 9"  (175.3 cm) Weight: 87.2 kg (192 lb 3.9 oz) IBW/kg (Calculated) : 70.7 Heparin Dosing Weight: 83.7 kg  Vital Signs: Temp: 99.3 F (37.4 C) (01/05 0700) Temp Source: Core (01/05 0000) BP: 92/68 (01/05 0700) Pulse Rate: 123 (01/05 0700)  Labs: Recent Labs    09/23/21 0350 09/23/21 0355 09/24/21 0409 09/24/21 0411 09/24/21 1631 09/25/21 0336 09/25/21 0340  HGB 8.1*   < > 8.2* 8.8*  --  8.1* 8.5*  HCT 23.0*   < > 23.0* 26.0*  --  23.4* 25.0*  PLT 345  --  408*  --   --  479*  --   LABPROT 29.8*  --  30.4*  --   --  23.1*  --   INR 2.8*  --  2.9*  --   --  2.1*  --   HEPARINUNFRC <0.10*  --   --   --   --   --   --   CREATININE 5.24*   < > 3.45*   3.40*  --  2.64* 2.38*  --    < > = values in this interval not displayed.     Estimated Creatinine Clearance: 32 mL/min (A) (by C-G formula based on SCr of 2.38 mg/dL (H)).   Medical History: Past Medical History:  Diagnosis Date   Arrhythmia    atrial fibrillation   CHF (congestive heart failure) (HCC)    Chronic kidney disease    Coronary artery disease    Hyperlipidemia    Hypertension    Myocardial infarct [facility])     Assessment: 71 yo male on chronic Eliquis PTA for afib.  Now s/p LVAD placement 09/17/21, pharmacy asked to begin anticoagulation with warfarin today. Low-dose heparin infusion started as well.  INR down to 2.1 and within goal range. EPW to come out today - ok to give warfarin tonight per Dr. Cyndia Bent. CBC and LDH stable.  Goal of Therapy:  INR 2-2.5 Monitor platelets by anticoagulation protocol: Yes   Plan:  -Warfarin 1mg  PO x1 tonight -Daily INR, CBC, LDH   Arrie Senate, PharmD, [place], [facility] Clinical Pharmacist [phone] Please check AMION  for all [facility] Pharmacy numbers 09/25/2021

## 2021-09-25 NOTE — Progress Notes (Addendum)
Patient ID: Isaac [name], male   DOB: [birth year], 71 y.o.   MRN: [id number]    Advanced Heart Failure VAD Team Note  PCP-Cardiologist: None   Subjective:    12/19 RHC- RA 7, PA 39/14 (25), PCWP 11, CO 6.4 CI 3. Thermo 3.5 1.8. Lasix drip stopped after cath. 12/20 Swan removed.  Norepi 3 mcg added.  12/21 Fever. Blood CX drawn. UA + leukocytes. Started on vanc and cefepime. Diuresed with IV lasix + metolazone.  12/23 Impella 5.5 placed 12/28 HM III LVAD, ICD leads plastered to tricuspid valve with severe TR, valve not replaced and some improvement in TR with LVAD placement.  12/29 Extubated 01/03 Started CRRT  Co-ox 57% on 5 Epi + 0.25 milrinone. NE off early this am.   Creatinine up to 5.24 with decreased UOP. Started CRRT on 01/03. Pulling for 50-100 ml/hr. 3.2L off yesterday. Filter clotted this am.  MAPs upper 70s-80s  T 100.04 this am, WBCs 17.7 trending down to 8.7 today.  Notes some gas pain and incisional discomfort this am.   LVAD INTERROGATION:  HeartMate III LVAD:   Flow 5 liters/min, speed 5500, power 4, PI 3.3.   LDH 344 => 379 => 376 => 379 => 356 => 370 => 378 => 380 INR 2.1  Plts 113 => 121 => 185 => 248 => 345 => 408 Hgb 7.9 => 8.3 => 8.3 => 8.1 => 8.2 => 8.1  Objective:    Vital Signs:   Temp:  [97.7 F (36.5 C)-99.7 F (37.6 C)] 99.3 F (37.4 C) (01/05 0700) Pulse Rate:  [108-239] 123 (01/05 0700) Resp:  [14-29] 26 (01/05 0700) BP: (90-123)/(68-95) 92/68 (01/05 0700) SpO2:  [87 %-100 %] 96 % (01/05 0700) Arterial Line BP: (65-136)/(46-106) 87/67 (01/05 0700) Weight:  [87.2 kg] 87.2 kg (01/05 0500) Last BM Date: 09/24/21 Mean arterial Pressure  70s-80s  Intake/Output:   Intake/Output Summary (Last 24 hours) at 09/25/2021 0740 Last data filed at 09/25/2021 0700 Gross per 24 hour  Intake 1832.21 ml  Output 3647 ml  Net -1814.79 ml     Physical Exam  CVP 16 Physical Exam: GENERAL: Sitting up in chair, no distress NECK: Supple, JVP 14+ .   2+ bilaterally, no bruits.  No lymphadenopathy or thyromegaly appreciated.   CARDIAC:  Mechanical heart sounds with LVAD hum present.  LUNGS:  Clear to auscultation bilaterally.  ABDOMEN:  Soft, round, nontender, positive bowel sounds x4.     LVAD exit site: well-healed and incorporated.  Dressing dry and intact.  No erythema or drainage.  Stabilization device present and accurately applied.  Driveline dressing is being changed daily per sterile technique. EXTREMITIES:  Warm and dry, no cyanosis, clubbing, rash, R fem HD cath, + UNNA boots NEUROLOGIC:  Alert and oriented x 4.  Gait steady.  No aphasia.  No dysarthria.  Affect pleasant.          Telemetry   AF 100s (personally reviewed)   Labs   Basic Metabolic Panel: Recent Labs  Lab 09/21/21 0344 09/21/21 0356 09/22/21 0406 09/22/21 0431 09/23/21 0350 09/23/21 0355 09/23/21 1709 09/24/21 0409 09/24/21 0411 09/24/21 1631 09/25/21 0336 09/25/21 0340  NA 131*   < > 130*   < > 130*   < > 132* 133*   133* 135 133* 132* 135  K 4.1   < > 4.2   < > 4.0   < > 4.0 4.4   4.4 4.4 4.5 4.7 4.7  CL 96*  --  97*   < > 97*  --  98 100   100  --  100 101  --   CO2 23  --  22   < > 21*  --  22 23   24   --  25 25  --   GLUCOSE 67*  --  112*   < > 79  --  164* 89   91  --  125* 91  --   BUN 45*  --  50*   < > 60*  --  52* 37*   38*  --  28* 21  --   CREATININE 3.33*  --  4.27*   < > 5.24*  --  4.43* 3.45*   3.40*  --  2.64* 2.38*  --   CALCIUM 8.3*  --  8.0*   < > 8.2*  --  8.0* 8.2*   8.2*  --  8.0* 8.2*  --   MG 2.3  --  2.3  --  2.6*  --   --  2.5*  --   --  2.4  --   PHOS 4.6  --  4.5  --  5.2*  --  4.3 3.0   3.0  --  2.2* 2.0*  --    < > = values in this interval not displayed.    Liver Function Tests: Recent Labs  Lab 09/19/21 0358 09/20/21 0251 09/23/21 1709 09/24/21 0409 09/24/21 1631 09/25/21 0336  AST 113* 86*  --  51*  --   --   ALT 9 5  --  <5  --   --   ALKPHOS 67 74  --  154*  --   --   BILITOT 4.3* 4.7*  --  3.2*   --   --   PROT 4.5* 4.8*  --  5.8*  --   --   ALBUMIN 2.3* 2.3* 2.0* 2.0*   1.9* 1.9* 2.0*   No results for input(s): LIPASE, AMYLASE in the last 168 hours. Recent Labs  Lab 09/20/21 0701  AMMONIA <10    CBC: Recent Labs  Lab 09/20/21 0251 09/20/21 1709 09/21/21 0344 09/21/21 0356 09/22/21 0406 09/22/21 0431 09/23/21 0350 09/23/21 0355 09/24/21 0409 09/24/21 0411 09/25/21 0336 09/25/21 0340  WBC 16.7*   < > 16.6*  --  14.7*  --  13.1*  --  11.0*  --  8.7  --   NEUTROABS 14.4*  --  14.6*  --  12.4*  --  10.7*  --  9.0*  --   --   --   HGB 7.9*   < > 8.3*   < > 8.3*   < > 8.1* 14.3 8.2* 8.8* 8.1* 8.5*  HCT 21.5*   < > 22.8*   < > 23.4*   < > 23.0* 42.0 23.0* 26.0* 23.4* 25.0*  MCV 85.0   < > 83.2  --  84.2  --  83.6  --  82.4  --  83.9  --   PLT 121*   < > 185  --  248  --  345  --  408*  --  479*  --    < > = values in this interval not displayed.    INR: Recent Labs  Lab 09/21/21 0344 09/22/21 0406 09/23/21 0350 09/24/21 0409 09/25/21 0336  INR 1.4* 1.9* 2.8* 2.9* 2.1*    Other results: EKG:    Imaging   ECHOCARDIOGRAM COMPLETE  Result Date: 09/23/2021    ECHOCARDIOGRAM REPORT  Patient Name:   Isaac [name] Date of Exam: 09/23/2021 Medical Rec #:  [id number]           Height:       69.0 in Accession #:    [id number]          Weight:       194.0 lb Date of Birth:  [birth year]            BSA:          2.039 m Patient Age:    71 years            BP:           0/0 mmHg Patient Gender: M                   HR:           121 bpm. Exam Location:  Inpatient Procedure: 2D Echo, Cardiac Doppler and Color Doppler Indications:    CHF-Acute systolic  History:        Patient has prior history of Echocardiogram examinations, most                 recent 09/16/2001. CHF, CAD, Defibrillator; Arrythmias:Atrial                 Fibrillation.  Sonographer:    Clayton Lefort RDCS (AE) Referring Phys: [place] Comments: No subcostal window. HeartMate 3 @ 3149 RPM.  Subcostal and SSN windows covered with bandages. IMPRESSIONS  1. Left ventricular ejection fraction, by estimation, is 20 to 25%. The left ventricle has severely decreased function. The left ventricle demonstrates global hypokinesis. There is mild left ventricular hypertrophy. Left ventricular diastolic parameters  are indeterminate. LVAD inflow cannula at apex, not well-visualized.  2. Peak RV-RA gradient 28 mmHg. Right ventricular systolic function is moderately reduced. The right ventricular size is normal.  3. Right atrial size was mildly dilated.  4. The aortic valve opens every 2nd beat. The aortic valve is tricuspid. Aortic valve regurgitation is not visualized. No aortic stenosis is present.  5. The tricuspid valve is abnormal. Tricuspid valve regurgitation is moderate to severe.  6. The mitral valve is normal in structure. No evidence of mitral valve regurgitation. No evidence of mitral stenosis.  7. There is a left pleural effusion.  8. A small pericardial effusion is present, primarily towards the apex. FINDINGS  Left Ventricle: Left ventricular ejection fraction, by estimation, is 20 to 25%. The left ventricle has severely decreased function. The left ventricle demonstrates global hypokinesis. The left ventricular internal cavity size was normal in size. There is mild left ventricular hypertrophy. Left ventricular diastolic parameters are indeterminate. Right Ventricle: Peak RV-RA gradient 28 mmHg. The right ventricular size is normal. No increase in right ventricular wall thickness. Right ventricular systolic function is moderately reduced. Left Atrium: Left atrial size was normal in size. Right Atrium: Right atrial size was mildly dilated. Pericardium: There is a left pleural effusion. A small pericardial effusion is present. Mitral Valve: The mitral valve is normal in structure. No evidence of mitral valve regurgitation. No evidence of mitral valve stenosis. Tricuspid Valve: The tricuspid valve is  abnormal. Tricuspid valve regurgitation is moderate to severe. Aortic Valve: The aortic valve opens every 2nd beat. The aortic valve is tricuspid. Aortic valve regurgitation is not visualized. No aortic stenosis is present. Pulmonic Valve: The pulmonic valve was normal in structure. Pulmonic valve regurgitation is not visualized. Aorta: The aortic root is  normal in size and structure. Venous: The inferior vena cava was not well visualized. IAS/Shunts: No atrial level shunt detected by color flow Doppler. Additional Comments: A device lead is visualized in the right ventricle.  LEFT VENTRICLE PLAX 2D LVIDd:         5.10 cm LVIDs:         3.90 cm LV PW:         1.40 cm LV IVS:        1.20 cm LVOT diam:     2.00 cm LVOT Area:     3.14 cm  RIGHT VENTRICLE RV Basal diam:  3.70 cm RV Mid diam:    2.60 cm TAPSE (M-mode): 0.5 cm LEFT ATRIUM             Index        RIGHT ATRIUM           Index LA diam:        3.90 cm 1.91 cm/m   RA Area:     19.80 cm LA Vol (A2C):   42.1 ml 20.64 ml/m  RA Volume:   53.30 ml  26.13 ml/m LA Vol (A4C):   33.3 ml 16.33 ml/m LA Biplane Vol: 36.7 ml 18.00 ml/m  TRICUSPID VALVE TR Peak grad:   27.7 mmHg TR Vmax:        263.00 cm/s  SHUNTS Systemic Diam: 2.00 cm Laymon Stockert McleanMD Electronically signed by Franki Monte Signature Date/Time: 09/23/2021/3:56:29 PM    Final      Medications:     Scheduled Medications:  sodium chloride   Intravenous Once   sodium chloride   Intravenous Once   aspirin  81 mg Oral Daily   bisacodyl  10 mg Oral Daily   Or   bisacodyl  10 mg Rectal Daily   chlorhexidine gluconate (MEDLINE KIT)  15 mL Mouth Rinse BID   Chlorhexidine Gluconate Cloth  6 each Topical Daily   docusate sodium  200 mg Oral Daily   feeding supplement  237 mL Oral TID BM   mouth rinse  15 mL Mouth Rinse BID   montelukast  5 mg Oral QHS   multivitamin with minerals  1 tablet Oral Daily   pantoprazole  40 mg Oral Daily   polyethylene glycol  17 g Oral Daily   rosuvastatin  10  mg Oral Daily   sodium chloride flush  10-40 mL Intracatheter Q12H   sodium chloride flush  3 mL Intravenous Q12H   Warfarin - Pharmacist Dosing Inpatient   Does not apply q1600    Infusions:   prismasol BGK 4/2.5 400 mL/hr at 09/25/21 0328    prismasol BGK 4/2.5 200 mL/hr at 09/24/21 1500   sodium chloride Stopped (09/20/21 0304)   sodium chloride Stopped (09/18/21 0803)   amiodarone 30 mg/hr (09/25/21 0700)   epinephrine 5 mcg/min (09/25/21 0700)   lactated ringers     milrinone 0.25 mcg/kg/min (09/25/21 0700)   norepinephrine (LEVOPHED) Adult infusion Stopped (09/25/21 0453)   prismasol BGK 4/2.5 1,800 mL/hr at 09/25/21 0455    PRN Medications: sodium chloride, albuterol, heparin, heparin, lip balm, melatonin, morphine injection, ondansetron (ZOFRAN) IV, oxyCODONE, polyethylene glycol, sodium chloride flush, sodium chloride flush, sorbitol, traMADol   Assessment/Plan:    1. Acute on chronic systolic CHF:  Long-standing cardiomyopathy.  [place].  Echo this admission with EF < 20%, severe LV dilation, restrictive diastolic function, moderate RV dysfunction, moderate MR, mod-severe TR. Cause of cardiomyopathy is uncertain.  He  has a history of CAD, but I do not think that the described CAD from the past could explain his cardiomyopathy, but CAD could have progressed.  With difficulty tolerating GDMT/need for midodrine and cardiorenal syndrome as well as profound volume overload + NYHA class IV symptoms,  concerned for low output HF. Co-ox off milrinone was 36%, milrinone started and increased to 0.375 but CO remained low. NE added and Impella 5.5 placed 12/23. EF 10% on TEE 12/23. s/p HM III VAD on 12/28.  Echo 01/03 with EF 20-25%, RV moderately reduced.  -Co-ox 57% on 0.25 milrinone + 5 Epi. NE weaned off early this am.  Maintain MAPs to allow renal perfusion with AKI on CKD. Add midodrine 5 mg TID to facilitate wean off NE -LVAD parameters stable. No alarms overnight.   -Creatinine trended up 3.33 => 4.27 => 5.24. Now on CRRT. Pulled off 3.2L yesterday. CVP 16 this am. Filter clotted this am, working to restart CRRT. Making small amount of urine. - INR 2.1. Hold warfarin. CTS planning to remove pacing wires once INR comes back down.  - Continue 81 mg aspirin - Continue UNNA boots 2. Tricuspid regurgitation: Has moderate-severe TR.  Tricuspid repair not done at time of VAD d/t proximity of ICD wires 3. Atrial fibrillation: Persistent since 10/22 based on device interrogation.  Was in junctional rhythm, now looks like he is back in AF with HR 100s.   - Continue amio at 30/hr 4. AKI on CKD stage 3: Creatinine 2.61 => 3.1 => 3.33 => 4.27 => 5.24.  Gradual rise (was about 1.7 pre-op) since surgery.  He has not been hypotensive post-op, ?intra-op hypotension with development of ATN => urine sediment looked like ATN per renal.  - Maintain MAP and CO.    - Now on CRRT. Pulling for net neg 50-100/hr. 3.2L removed yesterday. Weight down 3 lb overnight. Filter clotted this am. Continue volume removal once CRRT back on. CVP 16 - Nephrology following.   5. CAD: History of PCI to OM1 in 2007 and RCA in 2013.  No chest pain or ACS.  - Deferred cardiac cath due to AKI and plan for VAD - Continue Crestor.  6. ID: Had fever 12/21, PCT 1.03.  Blood cultures negative. ?Phlebitis at IV site. Completed course of abx.  -WBCs elevated to  17.7 => 16.6 => 14.7 => 13.1 => 11 => 8.7 post-op -Fever this am up to 100.29F. Check CXR, UA and blood cultures. 7. Constipation: Continue aggressive bowel regimen. Had another BM yesterday. 8. Hyponatremia: Hypervolemic hyponatremia.  Na 132 today.    9. Anemia: Blood loss anemia, had hematoma at Impella 5.5 site with significant drainage after removal. Hgb 8.1 today.  -  transfuse hgb < 7.5 (would like to avoid extra volume if possible).  10. Confusion/delirium: Initially post-op and started on Precedex.  This has cleared, now up and has been  walking halls 11. F/E/N: Advance diet. Ensure ordered. Encouraged PO intake.  I reviewed the LVAD parameters from today, and compared the results to the patient's prior recorded data.  No programming changes were made.  The LVAD is functioning within specified parameters.  The patient performs LVAD self-test daily.  LVAD interrogation was negative for any significant power changes, alarms or PI events/speed drops.  LVAD equipment check completed and is in good working order.  Back-up equipment present.   LVAD education done on emergency procedures and precautions and reviewed exit site care.  FINCH, LINDSAY N, PA-C 09/25/2021 7:40 AM  Patient seen with PA, agree with the above note.    He has walked already this morning about 200 feet.  Starting to eat more.    He remains in AF in 100s range.  Co-ox 57%, CVP 16.  Getting CVVH, pulling UF net negative 100 cc/hr.  I/Os net negative 1815.  Had 180 cc UOP.   General: Well appearing this am. NAD.  HEENT: Normal. Neck: Supple, JVP 14+ cm. Carotids OK.  Cardiac:  Mechanical heart sounds with LVAD hum present.  Lungs:  CTAB, normal effort.  Abdomen:  NT, ND, no HSM. No bruits or masses. +BS  LVAD exit site: Well-healed and incorporated. Dressing dry and intact. No erythema or drainage. Stabilization device present and accurately applied. Driveline dressing changed daily per sterile technique. Extremities:  Warm and dry. No cyanosis, clubbing, rash. 1+ edema to knees.  Neuro:  Alert & oriented x 3. Cranial nerves grossly intact. Moves all 4 extremities w/o difficulty. Affect pleasant    ATN per renal, likely from intra-op and early post-op hypotension.  Now on CVVH.  CVP 16. Making some urine, 180 cc yesterday.  Hope for renal function improvement with recovery of ATN.  - Gentle UF, aim for net negative 100 cc/hr.    Now off NE, on epinephrine 5 and milrinone 0.25.  Will decrease milrinone to 0.125 today, slow wean of epinephrine and will add  midodrine 5 mg tid.   LVAD parameters stable.    INR therapeutic at 2.1, remove pacing wires.   Tm 100 overnight. Will send UA, urine/blood cultures, CXR.    Needs to ambulate today, can unhook from CVVH to allow this.   CRITICAL CARE Performed by: Loralie Champagne  Total critical care time: 40 minutes  Critical care time was exclusive of separately billable procedures and treating other patients.  Critical care was necessary to treat or prevent imminent or life-threatening deterioration.  Critical care was time spent personally by me on the following activities: development of treatment plan with patient and/or surrogate as well as nursing, discussions with consultants, evaluation of patient's response to treatment, examination of patient, obtaining history from patient or surrogate, ordering and performing treatments and interventions, ordering and review of laboratory studies, ordering and review of radiographic studies, pulse oximetry and re-evaluation of patient's condition.  Loralie Champagne 09/25/2021 8:22 AM

## 2021-09-25 NOTE — Progress Notes (Signed)
Nutrition Follow-up  DOCUMENTATION CODES:   Severe malnutrition in context of chronic illness  INTERVENTION:   Continue Ensure Enlive po TID, each supplement provides 350 kcal and 20 grams of protein  Recommend liberalizing diet to REGULAR; discussed with HF team and received OK to liberalize diet  Cater to pt preferences; pt is a pescetarian, does eat eggs.  Family ok to bring in outside food  Trial of 30 ml ProSource Plus TID, each supplement provides 100 kcals and 15 grams protein.   Change Regular MVI to Rena MVI while on CRRT  If poor po intake continues, may need to consider Cortrak placement with supplemental TF  Encourage small frequent meals given early satiety. Pt should drink Ensure between meals; eat meal trays and/or food brought in by family at meal times  NUTRITION DIAGNOSIS:   Severe Malnutrition related to chronic illness (heart failure) as evidenced by severe fat depletion, severe muscle depletion.  Being addressed via lib diet, supplements  GOAL:   Patient will meet greater than or equal to 90% of their needs  Not Met  MONITOR:   PO intake, Supplement acceptance, Labs, Weight trends  REASON FOR ASSESSMENT:   Consult LVAD Eval  ASSESSMENT:   71 yo male admitted with acute on chronic CHF. PMH includes CHF, CAD, CKD, HLD, HTN  12/23 Impella 5.5 placed 12/28 HM3 LVAD plaecd, Impella removed, Intubated 12/29 Extubated 12/31 FL diet 01/02 Diet advanced to 2g sodium 01/03 CRRT initiated  Low grade fever today Remains on CRRT Pt has been ambulating some in the hallway  PO intake of meals remains poor but starting to slowly improve per pt's wife. Pt with good appetite, eating well, drinking Enure supplements prior to LVAD but appetite declined significantly potop and is slow to return. Today pt took bites at breakfast, ate entire bowl of tomato soup and drain liquid from potato soup (did not eat the solid food). Wife bringing in home smoothies that  she adds protein powder-27 g per smoothie per wife. Yesterday pt only took 2 Ensures and 1 Protein smoothie.   Pt with episode of emesis on 1/02 after eating dinner. No emesis since. On last assessment pt with severe constipation with no BM since 12/21. Pt has finally had some small BMs but likely needs to have more given prolonged constipation.  Pt reports early satiety and abdominal discomfort. Fluid removal and further BMs should help with this  Discussed the importance of adequate protein intake and increasing overall po intake. Pt agreeable to continuing Ensure Enlive; also agreeable to trying protein modular for additional protein  Current wt 87.2 kg admit weight 78 kg. Lowest weight 74 kg. Net negative 3L per I/O. Pt with moderate generalized edema on exam; I/O not likely accurate  Pt making minimal urine  Labs: albumin 1.9, sodium 130 (L), CBG 98 Meds: miralax, colace, dulcolax, sorbitol prn  Diet Order:   Diet Order             Diet regular Room service appropriate? Yes; Fluid consistency: Thin  Diet effective now                   EDUCATION NEEDS:   Education needs have been addressed  Skin:  Skin Assessment: Reviewed RN Assessment  Last BM:  12/21  Height:   Ht Readings from Last 1 Encounters:  09/17/21 5' 9"  (1.753 m)    Weight:   Wt Readings from Last 1 Encounters:  09/25/21 87.2 kg    Ideal  Body Weight:     BMI:  Body mass index is 28.39 kg/m.  Estimated Nutritional Needs:   Kcal:  2300-2500 kcals  Protein:  135-160 g  Fluid:  1.8 L   Kerman Passey MS, RDN, LDN, CNSC Registered Dietitian III Clinical Nutrition RD Pager and On-Call Pager Number Located in [place]

## 2021-09-25 NOTE — Progress Notes (Signed)
Pacing wires pulled per order. Vital signs stable during procedure. Vital signs trended per protocol. Pt educated throughout procedure.

## 2021-09-25 NOTE — Progress Notes (Signed)
Stopped patient's CRRT at 0600 for AM walk, set CRRT to recirc but when ready to attach patient back to CRRT, circuit was giving high access pressure alarms indicating it clotted. Unable to return blood and unload old filter.   CRRT attempted to restart but unable d/t machine failing the zero. Will go get new machine from other ICU and will restart patient on same settings.   Unable to get fluid removal rate for 0600 d/t machine malfunction.

## 2021-09-26 DIAGNOSIS — Z95811 Presence of heart assist device: Secondary | ICD-10-CM | POA: Diagnosis not present

## 2021-09-26 DIAGNOSIS — R570 Cardiogenic shock: Secondary | ICD-10-CM | POA: Diagnosis not present

## 2021-09-26 DIAGNOSIS — I5022 Chronic systolic (congestive) heart failure: Secondary | ICD-10-CM | POA: Diagnosis not present

## 2021-09-26 LAB — PROTIME-INR
INR: 2 — ABNORMAL HIGH (ref 0.8–1.2)
Prothrombin Time: 22.7 seconds — ABNORMAL HIGH (ref 11.4–15.2)

## 2021-09-26 LAB — RENAL FUNCTION PANEL
Albumin: 1.9 g/dL — ABNORMAL LOW (ref 3.5–5.0)
Albumin: 1.9 g/dL — ABNORMAL LOW (ref 3.5–5.0)
Anion gap: 9 (ref 5–15)
Anion gap: 8 (ref 5–15)
BUN: 18 mg/dL (ref 8–23)
BUN: 17 mg/dL (ref 8–23)
CO2: 26 mmol/L (ref 22–32)
CO2: 23 mmol/L (ref 22–32)
Calcium: 7.9 mg/dL — ABNORMAL LOW (ref 8.9–10.3)
Calcium: 8.1 mg/dL — ABNORMAL LOW (ref 8.9–10.3)
Chloride: 98 mmol/L (ref 98–111)
Chloride: 103 mmol/L (ref 98–111)
Creatinine, Ser: 1.97 mg/dL — ABNORMAL HIGH (ref 0.61–1.24)
Creatinine, Ser: 1.87 mg/dL — ABNORMAL HIGH (ref 0.61–1.24)
GFR, Estimated: 36 mL/min — ABNORMAL LOW (ref >60)
GFR, Estimated: 38 mL/min — ABNORMAL LOW (ref >60)
Glucose, Bld: 124 mg/dL — ABNORMAL HIGH (ref 70–99)
Glucose, Bld: 116 mg/dL — ABNORMAL HIGH (ref 70–99)
Phosphorus: 2.8 mg/dL (ref 2.5–4.6)
Phosphorus: 2.6 mg/dL (ref 2.5–4.6)
Potassium: 4.5 mmol/L (ref 3.5–5.1)
Potassium: 4.4 mmol/L (ref 3.5–5.1)
Sodium: 133 mmol/L — ABNORMAL LOW (ref 135–145)
Sodium: 134 mmol/L — ABNORMAL LOW (ref 135–145)

## 2021-09-26 LAB — URINALYSIS, COMPLETE (UACMP) WITH MICROSCOPIC
Bilirubin Urine: NEGATIVE
Glucose, UA: NEGATIVE mg/dL
Ketones, ur: NEGATIVE mg/dL
Nitrite: NEGATIVE
Protein, ur: 100 mg/dL — AB
Specific Gravity, Urine: 1.014 (ref 1.005–1.030)
pH: 5 (ref 5.0–8.0)

## 2021-09-26 LAB — CBC
HCT: 22.3 % — ABNORMAL LOW (ref 39.0–52.0)
Hemoglobin: 7.7 g/dL — ABNORMAL LOW (ref 13.0–17.0)
MCH: 29.1 pg (ref 26.0–34.0)
MCHC: 34.5 g/dL (ref 30.0–36.0)
MCV: 84.2 fL (ref 80.0–100.0)
Platelets: 465 10*3/uL — ABNORMAL HIGH (ref 150–400)
RBC: 2.65 MIL/uL — ABNORMAL LOW (ref 4.22–5.81)
RDW: 18.8 % — ABNORMAL HIGH (ref 11.5–15.5)
WBC: 10.1 10*3/uL (ref 4.0–10.5)
nRBC: 0 % (ref 0.0–0.2)

## 2021-09-26 LAB — GLUCOSE, CAPILLARY
Glucose-Capillary: 103 mg/dL — ABNORMAL HIGH (ref 70–99)
Glucose-Capillary: 88 mg/dL (ref 70–99)
Glucose-Capillary: 82 mg/dL (ref 70–99)
Glucose-Capillary: 153 mg/dL — ABNORMAL HIGH (ref 70–99)

## 2021-09-26 LAB — LACTATE DEHYDROGENASE: LDH: 331 U/L — ABNORMAL HIGH (ref 98–192)

## 2021-09-26 LAB — MAGNESIUM: Magnesium: 2.4 mg/dL (ref 1.7–2.4)

## 2021-09-26 LAB — PREPARE RBC (CROSSMATCH)

## 2021-09-26 LAB — COOXEMETRY PANEL
Carboxyhemoglobin: 1.6 % — ABNORMAL HIGH (ref 0.5–1.5)
Methemoglobin: 0.7 % (ref 0.0–1.5)
O2 Saturation: 58.1 %
Total hemoglobin: 7.4 g/dL — ABNORMAL LOW (ref 12.0–16.0)

## 2021-09-26 LAB — PROCALCITONIN: Procalcitonin: 2.63 ng/mL

## 2021-09-26 MED ORDER — MIDODRINE HCL 5 MG PO TABS
10.0000 mg | ORAL_TABLET | Freq: Three times a day (TID) | ORAL | Status: DC
  Administered 2021-09-26 – 2021-10-01 (×15): 10 mg via ORAL
  Filled 2021-09-26 (×14): qty 2

## 2021-09-26 MED ORDER — WARFARIN SODIUM 2 MG PO TABS
2.0000 mg | ORAL_TABLET | Freq: Once | ORAL | Status: AC
  Administered 2021-09-26: 2 mg via ORAL
  Filled 2021-09-26: qty 1

## 2021-09-26 MED ORDER — SODIUM CHLORIDE 0.9% IV SOLUTION: Freq: Once | INTRAVENOUS | Status: AC

## 2021-09-26 NOTE — Progress Notes (Signed)
Isaac [name] for Warfarin  Indication: LVAD/AF  Allergies  Allergen Reactions   Mushroom Extract Complex Nausea And Vomiting   Neosporin [Neomycin-Bacitracin Zn-Polymyx] Hives    Patient Measurements: Height: 5\' 9"  (175.3 cm) Weight: 87.2 kg (192 lb 3.9 oz) IBW/kg (Calculated) : 70.7 Heparin Dosing Weight: 83.7 kg  Vital Signs: Temp: 97.9 F (36.6 C) (01/06 0650) Temp Source: Oral (01/06 0650) BP: 77/60 (01/06 0700) Pulse Rate: 99 (01/06 0700)  Labs: Recent Labs    09/24/21 0409 09/24/21 0411 09/25/21 0336 09/25/21 0340 09/25/21 1535 09/26/21 0250  HGB 8.2*   < > 8.1* 8.5*  --  7.7*  HCT 23.0*   < > 23.4* 25.0*  --  22.3*  PLT 408*  --  479*  --   --  465*  LABPROT 30.4*  --  23.1*  --   --  22.7*  INR 2.9*  --  2.1*  --   --  2.0*  CREATININE 3.45*   3.40*   < > 2.38*  --  2.19* 1.97*   < > = values in this interval not displayed.     Estimated Creatinine Clearance: 38.7 mL/min (A) (by C-G formula based on SCr of 1.97 mg/dL (H)).   Medical History: Past Medical History:  Diagnosis Date   Arrhythmia    atrial fibrillation   CHF (congestive heart failure) (HCC)    Chronic kidney disease    Coronary artery disease    Hyperlipidemia    Hypertension    Myocardial infarct [facility])     Assessment: 71 yo male on chronic Eliquis PTA for afib.  Now s/p LVAD placement 09/17/21, pharmacy asked to begin anticoagulation with warfarin today. Low-dose heparin infusion started as well.  INR is therapeutic at 2.0, EPW out. LDH and CBC remain stable.  Goal of Therapy:  INR 2-2.5 Monitor platelets by anticoagulation protocol: Yes   Plan:  -Warfarin 2mg  PO x1 tonight -Daily INR, CBC, LDH    Isaac [name], PharmD, [place], [facility] Clinical Pharmacist [phone] Please check AMION for all [facility] numbers 09/26/2021

## 2021-09-26 NOTE — Progress Notes (Signed)
Isaac [name] Progress Note   71 y.o. 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 Impella was placed on 09/12/2021 and then HM3 on 12/28. CPB time was 161 minutes.  Creatinine was 2.02 12/09/2015 and BL ~1.6-2.    Assessment/ Plan:   Acute kidney injury on CKD3b with a baseline cr in the 1.6-2 range. Acute component either from cardiorenal +/- ATN with decreased renal function from hemodynamics and CPB + hypotensive episodes + pressors. No absolute indication for RRT but he is heading in that direction which I di/w the spouse who was bedside. Weights have increased from 78.4kg to 86.3kg which is c/w worsening edema. CVP ~12. Failed Lasix challenge and CRRT started 1/3. I personally reviewed the urine microscopy and c/w ATN.  Seen on CRRT w/ 4K baths pre/post/dialysate; CRRT started on 1/3. Net neg 1.5L/2L/+273m per 24hr period since CRRT started.  Tolerating UF -> 50-100 ml/hr;  trying to avoid hypotensive episodes which will prolong recovery. FIlter clotted yest and also this AM thought to be from kinking of the line. CVP down from 16 to 11.    Will continue at the current goal rate; if clotting outside of line kinking occurs more frequently can consider heparin at that time. Patient is on coumadin.  Rt fem cath  -Cont monitoring daily I/Os + weights  -Maintain MAP>65 for optimal renal perfusion.  -Avoid nephrotoxic medications including NSAIDs, contrast. - Dose medications for a GFR <15 ml/min and also on CRRT.   HFrEF with underlying CM s/p HM3 with CVP in the 12 range with worsening edema despite Lasix but unfortunately renal function worsening. On Milrinone, epinephrine + Levophed.  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  Anemia Afib  Subjective:   CVP 11 CRRT which he is  tolerating. States that his breathing is more comfortable now. C/o left side pain worse w movement. Levo 373m, Epinephrine, Milrinone now. Co-Ox 57%.   Objective:   BP 94/81    Pulse 100    Temp 99 F (37.2 C)    Resp 14    Ht 5' 9"  (1.753 m)    Wt 87.2 kg    SpO2 98%    BMI 28.39 kg/m   Intake/Output Summary (Last 24 hours) at 09/26/2021 1042 Last data filed at 09/26/2021 1000 Gross per 24 hour  Intake 2200.6 ml  Output 1822 ml  Net 378.6 ml   Weight change:   Physical Exam: GEN: NAD, A&Ox3, NCAT HEENT: No conjunctival pallor, EOMI NECK: Supple, JVD present LUNGS: CTA B/L no rales, rhonchi or wheezing CV: LVAD hum ABD: SNDNT +BS  EXT: 1+ lower extremity edema and also present in the UE, UNNA boots ACCESS: rt fem temp   Imaging: DG CHEST PORT 1 VIEW  Result Date: 09/25/2021 CLINICAL DATA:  Fever, difficulty breathing EXAM: PORTABLE CHEST 1 VIEW COMPARISON:  Previous studies including the examination of 09/21/2021 FINDINGS: Transverse diameter of heart is increased. Central pulmonary vessels are more prominent. There is increased opacification of left lower lung fields. There is interval removal of left chest tube. Tip of PICC line is seen in the region of right superior vena cava. Pacemaker/defibrillator battery is seen in the left infraclavicular region. IMPRESSION: Cardiomegaly. Central pulmonary vessels are more prominent. Increased interstitial markings are seen in parahilar regions and lower lung fields  suggesting interstitial edema. There is increased density in the left lower lung fields suggesting increase in pleural effusion and possibly underlying atelectasis/pneumonia. Electronically Signed   By: Elmer Picker M.D.   On: 09/25/2021 08:57    Labs: BMET Recent Labs  Lab 09/23/21 0350 09/23/21 0355 09/23/21 1709 09/24/21 0409 09/24/21 0411 09/24/21 1631 09/25/21 0336 09/25/21 0340 09/25/21 1535 09/26/21 0250  NA 130*   < > 132* 133*   133* 135 133* 132* 135  130* 133*  K 4.0   < > 4.0 4.4   4.4 4.4 4.5 4.7 4.7 4.4 4.5  CL 97*  --  98 100   100  --  100 101  --  100 98  CO2 21*  --  22 23   24   --  25 25  --  25 26  GLUCOSE 79  --  164* 89   91  --  125* 91  --  127* 124*  BUN 60*  --  52* 37*   38*  --  28* 21  --  20 18  CREATININE 5.24*  --  4.43* 3.45*   3.40*  --  2.64* 2.38*  --  2.19* 1.97*  CALCIUM 8.2*  --  8.0* 8.2*   8.2*  --  8.0* 8.2*  --  7.6* 7.9*  PHOS 5.2*  --  4.3 3.0   3.0  --  2.2* 2.0*  --  2.8 2.8   < > = values in this interval not displayed.   CBC Recent Labs  Lab 09/21/21 0344 09/21/21 0356 09/22/21 0406 09/22/21 0431 09/23/21 0350 09/23/21 0355 09/24/21 0409 09/24/21 0411 09/25/21 0336 09/25/21 0340 09/26/21 0250  WBC 16.6*  --  14.7*  --  13.1*  --  11.0*  --  8.7  --  10.1  NEUTROABS 14.6*  --  12.4*  --  10.7*  --  9.0*  --   --   --   --   HGB 8.3*   < > 8.3*   < > 8.1*   < > 8.2* 8.8* 8.1* 8.5* 7.7*  HCT 22.8*   < > 23.4*   < > 23.0*   < > 23.0* 26.0* 23.4* 25.0* 22.3*  MCV 83.2  --  84.2  --  83.6  --  82.4  --  83.9  --  84.2  PLT 185  --  248  --  345  --  408*  --  479*  --  465*   < > = values in this interval not displayed.    Medications:     (feeding supplement) PROSource Plus  30 mL Oral TID WC   sodium chloride   Intravenous Once   sodium chloride   Intravenous Once   acetaminophen  500 mg Oral Q6H   aspirin  81 mg Oral Daily   bisacodyl  10 mg Oral Daily   Or   bisacodyl  10 mg Rectal Daily   chlorhexidine gluconate (MEDLINE KIT)  15 mL Mouth Rinse BID   Chlorhexidine Gluconate Cloth  6 each Topical Daily   docusate sodium  200 mg Oral Daily   feeding supplement  237 mL Oral TID BM   mouth rinse  15 mL Mouth Rinse BID   midodrine  10 mg Oral TID WC   montelukast  5 mg Oral QHS   multivitamin  1 tablet Oral QHS   pantoprazole  40 mg Oral Daily   polyethylene glycol  17 g  Oral Daily   rosuvastatin  10 mg Oral Daily   sodium chloride flush  10-40 mL Intracatheter Q12H   sodium  chloride flush  3 mL Intravenous Q12H   warfarin  2 mg Oral ONCE-1600   Warfarin - Pharmacist Dosing Inpatient   Does not apply q1600      Otelia Santee, MD 09/26/2021, 10:42 AM

## 2021-09-26 NOTE — Progress Notes (Signed)
Patient ID: Isaac [name], male   DOB: May 28, 1951, 71 y.o.   MRN: [id number]    Advanced Heart Failure VAD Team Note  PCP-Cardiologist: None   Subjective:    12/19 RHC- RA 7, PA 39/14 (25), PCWP 11, CO 6.4 CI 3. Thermo 3.5 1.8. Lasix drip stopped after cath. 12/20 Swan removed.  Norepi 3 mcg added.  12/21 Fever. Blood CX drawn. UA + leukocytes. Started on vanc and cefepime. Diuresed with IV lasix + metolazone.  12/23 Impella 5.5 placed 12/28 HM III LVAD, ICD leads plastered to tricuspid valve with severe TR, valve not replaced and some improvement in TR with LVAD placement.  12/29 Extubated 01/03 Started CRRT  Co-ox 58% on 6 Epi + 0.125 milrinone + NE 2.   MAP 70.  CVP 11-12.  He is on CVVH, I/Os even yesterday due to problems with clotting filter and kinking line.   He remains in AF in 90s, on amiodarone gtt.   He is on vancomycin/cefepime for low grade fever and possible LLL PNA.  PCT 2.63 today, creatinine down to 10.1.   He walked yesterday but feels more weak today.  Hgb down to 7.7, has had significant losses through CVVH machine.    LVAD INTERROGATION:  HeartMate III LVAD:   Flow 5 liters/min, speed 5500, power 4, PI 2.3.   LDH 344 => 379 => 376 => 379 => 356 => 370 => 378 =>  380 => 331 INR 2  Plts 113 => 121 => 185 => 248 => 345 => 408 => 465 Hgb 7.9 => 8.3 => 8.3 => 8.1 => 8.2 => 8.1 => 7.7  Objective:    Vital Signs:   Temp:  [97.7 F (36.5 C)-99.9 F (37.7 C)] 98.8 F (37.1 C) (01/06 0900) Pulse Rate:  [80-221] 95 (01/06 0900) Resp:  [11-27] 17 (01/06 0900) BP: (65-127)/(43-99) 105/68 (01/06 0800) SpO2:  [84 %-100 %] 95 % (01/06 0900) Arterial Line BP: (56-122)/(37-101) 93/60 (01/06 0900) Last BM Date: 09/25/21 Mean arterial Pressure  70s  Intake/Output:   Intake/Output Summary (Last 24 hours) at 09/26/2021 0925 Last data filed at 09/26/2021 0900 Gross per 24 hour  Intake 2317.79 ml  Output 1905 ml  Net 412.79 ml     Physical Exam  CVP  11-12 General: Well appearing this am. NAD.  HEENT: Normal. Neck: Supple, JVP 12-14 cm. Carotids OK.  Cardiac:  Mechanical heart sounds with LVAD hum present.  Lungs:  Decreased BS left base.   Abdomen:  NT, ND, no HSM. No bruits or masses. +BS  LVAD exit site: Well-healed and incorporated. Dressing dry and intact. No erythema or drainage. Stabilization device present and accurately applied. Driveline dressing changed daily per sterile technique. Extremities:  Warm and dry. No cyanosis, clubbing, rash. 1+ edema to knees. Neuro:  Alert & oriented x 3. Cranial nerves grossly intact. Moves all 4 extremities w/o difficulty. Affect pleasant     Telemetry   AF 90s (personally reviewed)   Labs   Basic Metabolic Panel: Recent Labs  Lab 09/22/21 0406 09/22/21 0431 09/23/21 0350 09/23/21 0355 09/24/21 0409 09/24/21 0411 09/24/21 1631 09/25/21 0336 09/25/21 0340 09/25/21 1535 09/26/21 0250  NA 130*   < > 130*   < > 133*   133*   < > 133* 132* 135 130* 133*  K 4.2   < > 4.0   < > 4.4   4.4   < > 4.5 4.7 4.7 4.4 4.5  CL 97*   < >  97*   < > 100   100  --  100 101  --  100 98  CO2 22   < > 21*   < > 23   24  --  25 25  --  25 26  GLUCOSE 112*   < > 79   < > 89   91  --  125* 91  --  127* 124*  BUN 50*   < > 60*   < > 37*   38*  --  28* 21  --  20 18  CREATININE 4.27*   < > 5.24*   < > 3.45*   3.40*  --  2.64* 2.38*  --  2.19* 1.97*  CALCIUM 8.0*   < > 8.2*   < > 8.2*   8.2*  --  8.0* 8.2*  --  7.6* 7.9*  MG 2.3  --  2.6*  --  2.5*  --   --  2.4  --   --  2.4  PHOS 4.5  --  5.2*   < > 3.0   3.0  --  2.2* 2.0*  --  2.8 2.8   < > = values in this interval not displayed.    Liver Function Tests: Recent Labs  Lab 09/20/21 0251 09/23/21 1709 09/24/21 0409 09/24/21 1631 09/25/21 0336 09/25/21 1535 09/26/21 0250  AST 86*  --  51*  --   --   --   --   ALT 5  --  <5  --   --   --   --   ALKPHOS 74  --  154*  --   --   --   --   BILITOT 4.7*  --  3.2*  --   --   --   --   PROT 4.8*   --  5.8*  --   --   --   --   ALBUMIN 2.3*   < > 2.0*   1.9* 1.9* 2.0* 1.9* 1.9*   < > = values in this interval not displayed.   No results for input(s): LIPASE, AMYLASE in the last 168 hours. Recent Labs  Lab 09/20/21 0701  AMMONIA <10    CBC: Recent Labs  Lab 09/20/21 0251 09/20/21 1709 09/21/21 0344 09/21/21 0356 09/22/21 0406 09/22/21 0431 09/23/21 0350 09/23/21 0355 09/24/21 0409 09/24/21 0411 09/25/21 0336 09/25/21 0340 09/26/21 0250  WBC 16.7*   < > 16.6*  --  14.7*  --  13.1*  --  11.0*  --  8.7  --  10.1  NEUTROABS 14.4*  --  14.6*  --  12.4*  --  10.7*  --  9.0*  --   --   --   --   HGB 7.9*   < > 8.3*   < > 8.3*   < > 8.1*   < > 8.2* 8.8* 8.1* 8.5* 7.7*  HCT 21.5*   < > 22.8*   < > 23.4*   < > 23.0*   < > 23.0* 26.0* 23.4* 25.0* 22.3*  MCV 85.0   < > 83.2  --  84.2  --  83.6  --  82.4  --  83.9  --  84.2  PLT 121*   < > 185  --  248  --  345  --  408*  --  479*  --  465*   < > = values in this interval not displayed.    INR: Recent Labs  Lab  09/22/21 0406 09/23/21 0350 09/24/21 0409 09/25/21 0336 09/26/21 0250  INR 1.9* 2.8* 2.9* 2.1* 2.0*    Other results: EKG:    Imaging   DG CHEST PORT 1 VIEW  Result Date: 09/25/2021 CLINICAL DATA:  Fever, difficulty breathing EXAM: PORTABLE CHEST 1 VIEW COMPARISON:  Previous studies including the examination of 09/21/2021 FINDINGS: Transverse diameter of heart is increased. Central pulmonary vessels are more prominent. There is increased opacification of left lower lung fields. There is interval removal of left chest tube. Tip of PICC line is seen in the region of right superior vena cava. Pacemaker/defibrillator battery is seen in the left infraclavicular region. IMPRESSION: Cardiomegaly. Central pulmonary vessels are more prominent. Increased interstitial markings are seen in parahilar regions and lower lung fields suggesting interstitial edema. There is increased density in the left lower lung fields suggesting  increase in pleural effusion and possibly underlying atelectasis/pneumonia. Electronically Signed   By: Elmer Picker M.D.   On: 09/25/2021 08:57     Medications:     Scheduled Medications:  (feeding supplement) PROSource Plus  30 mL Oral TID WC   sodium chloride   Intravenous Once   sodium chloride   Intravenous Once   sodium chloride   Intravenous Once   acetaminophen  500 mg Oral Q6H   aspirin  81 mg Oral Daily   bisacodyl  10 mg Oral Daily   Or   bisacodyl  10 mg Rectal Daily   chlorhexidine gluconate (MEDLINE KIT)  15 mL Mouth Rinse BID   Chlorhexidine Gluconate Cloth  6 each Topical Daily   docusate sodium  200 mg Oral Daily   feeding supplement  237 mL Oral TID BM   mouth rinse  15 mL Mouth Rinse BID   midodrine  5 mg Oral TID WC   montelukast  5 mg Oral QHS   multivitamin  1 tablet Oral QHS   pantoprazole  40 mg Oral Daily   polyethylene glycol  17 g Oral Daily   rosuvastatin  10 mg Oral Daily   sodium chloride flush  10-40 mL Intracatheter Q12H   sodium chloride flush  3 mL Intravenous Q12H   warfarin  2 mg Oral ONCE-1600   Warfarin - Pharmacist Dosing Inpatient   Does not apply q1600    Infusions:   prismasol BGK 4/2.5 400 mL/hr at 09/25/21 1537    prismasol BGK 4/2.5 200 mL/hr at 09/24/21 1500   sodium chloride Stopped (09/20/21 0304)   sodium chloride Stopped (09/18/21 0803)   amiodarone 30 mg/hr (09/26/21 0900)   ceFEPime (MAXIPIME) IV Stopped (09/25/21 2142)   epinephrine 6 mcg/min (09/26/21 0900)   lactated ringers     milrinone 0.125 mcg/kg/min (09/26/21 0900)   norepinephrine (LEVOPHED) Adult infusion 3 mcg/min (09/26/21 0900)   prismasol BGK 4/2.5 1,800 mL/hr at 09/25/21 1605   vancomycin      PRN Medications: sodium chloride, albuterol, heparin, heparin, lip balm, melatonin, morphine injection, ondansetron (ZOFRAN) IV, oxyCODONE, polyethylene glycol, sodium chloride flush, sodium chloride flush, sorbitol, traMADol   Assessment/Plan:    1.  Acute on chronic systolic CHF:  Long-standing cardiomyopathy.  [place].  Echo this admission with EF < 20%, severe LV dilation, restrictive diastolic function, moderate RV dysfunction, moderate MR, mod-severe TR. Cause of cardiomyopathy is uncertain.  He has a history of CAD, but I do not think that the described CAD from the past could explain his cardiomyopathy, but CAD could have progressed.  With difficulty tolerating GDMT/need for midodrine  and cardiorenal syndrome as well as profound volume overload + NYHA class IV symptoms,  concerned for low output HF. Co-ox off milrinone was 36%, milrinone started and increased to 0.375 but CO remained low. NE added and Impella 5.5 placed 12/23. EF 10% on TEE 12/23. s/p HM III VAD on 12/28.  Echo 01/03 with EF 20-25%, RV moderately reduced, septum mid-line.  Co-ox 58% on 0.125 milrinone + 6 Epi + 2 NE. MAP 70s. LVAD parameters stable.  - Can increase midodrine to 10 mg tid to allow slow wean of presssors.  Need to keep MAP ideally >75 to promote renal perfusion/recovery.  - Gentle UF by CVVH, aim for net 50-100 cc/hr negative.  - Warfarin for INR 2-2.5.  - Continue 81 mg aspirin - Continue UNNA boots 2. Tricuspid regurgitation: Has moderate-severe TR.  Tricuspid repair not done at time of VAD d/t proximity of ICD wires 3. Atrial fibrillation: Persistent since 10/22 based on device interrogation.  Was in junctional rhythm, now looks like he is back in AF with HR 90s.   - Continue amio at 30/hr 4. AKI on CKD stage 3: Creatinine 2.61 => 3.1 => 3.33 => 4.27 => 5.24.  Gradual rise (was about 1.7 pre-op) since surgery.  He has not been hypotensive post-op, suspect intra-op hypotension led to development of ATN => urine sediment looked like ATN per renal. Now on CVVH.  - Maintain MAP and CO.    - Pulling for net neg 50-100/hr UF.  - Nephrology following.   - Need to get out femoral line and have IR place subclavian or IJ HD catheter today. Will consult.   5. CAD: History of PCI to OM1 in 2007 and RCA in 2013.  No chest pain or ACS.  - Deferred cardiac cath due to AKI and plan for VAD - Continue Crestor.  6. ID: Had fever 12/21, PCT 1.03.  Blood cultures negative. ?Phlebitis at IV site. Post-op fever with elevated WBCs, PCT 2.63 today.  - Currently on vancomycin/cefepime, can stop vancomycin soon if no culture data.  7. Constipation: Continue aggressive bowel regimen. Had another BM yesterday. 8. Hyponatremia: Hypervolemic hyponatremia.  Controlled via CVVH.  9. Anemia: Hgb down to 7.7 today and feels more tired.  Think losses via CVVH machine overnight.  -  I will give him 1 unit PRBCs today.   10. Confusion/delirium: Initially post-op and started on Precedex.  This has cleared, now up and has been walking halls 11. F/E/N: Advance diet. Ensure ordered. Encouraged PO intake. 12. Left pleural effusion: Will see if he can have US-guided thoracentesis by IR today when he goes down for line placement.   I reviewed the LVAD parameters from today, and compared the results to the patient's prior recorded data.  No programming changes were made.  The LVAD is functioning within specified parameters.  The patient performs LVAD self-test daily.  LVAD interrogation was negative for any significant power changes, alarms or PI events/speed drops.  LVAD equipment check completed and is in good working order.  Back-up equipment present.   LVAD education done on emergency procedures and precautions and reviewed exit site care.  CRITICAL CARE Performed by: Loralie Champagne  Total critical care time: 40 minutes  Critical care time was exclusive of separately billable procedures and treating other patients.  Critical care was necessary to treat or prevent imminent or life-threatening deterioration.  Critical care was time spent personally by me on the following activities: development of treatment plan with patient and/or  surrogate as well as nursing, discussions  with consultants, evaluation of patient's response to treatment, examination of patient, obtaining history from patient or surrogate, ordering and performing treatments and interventions, ordering and review of laboratory studies, ordering and review of radiographic studies, pulse oximetry and re-evaluation of patient's condition.  Loralie Champagne 09/26/2021 9:25 AM

## 2021-09-26 NOTE — Progress Notes (Signed)
Patient ID: Isaac [name], male   DOB: [birth year], 71 y.o.   MRN: [id number] HeartMate 3 Rounding Note  Subjective:    Slept ok but feels tired and weak this am. CRRT not working well overnight.  Co-ox 58% on milrinone 0.125, epi 6, low dose NE as needed.  CVP 11  Hgb dropped a little further to 7.7, likely due to CRRT and filter changes, blood draws.    LVAD INTERROGATION:  HeartMate IIl LVAD:  Flow 4,2 liters/min, speed 5500, power 4, PI 2.4.   Objective:    Vital Signs:   Temp:  [97.7 F (36.5 C)-100.2 F (37.9 C)] 97.9 F (36.6 C) (01/06 0650) Pulse Rate:  [80-221] 99 (01/06 0700) Resp:  [11-27] 12 (01/06 0700) BP: (65-127)/(43-99) 77/60 (01/06 0700) SpO2:  [84 %-100 %] 99 % (01/06 0700) Arterial Line BP: (56-122)/(37-101) 96/62 (01/06 0700) Last BM Date: 09/25/21 Mean arterial Pressure 67  Intake/Output:   Intake/Output Summary (Last 24 hours) at 09/26/2021 0754 Last data filed at 09/26/2021 0500 Gross per 24 hour  Intake 2083.92 ml  Output 1920 ml  Net 163.92 ml     Physical Exam: General:  Looks tired. No resp difficulty HEENT: normal Neck: normal Cor: Distant heart sounds with LVAD hum present. Lungs: decreased over LLL Abdomen: soft, nontender, nondistended. Good bowel sounds. Extremities: moderate edema Neuro: alert & orientedx3, cranial nerves grossly intact. moves all 4 extremities w/o difficulty. Affect pleasant but flat.  Telemetry: sinus 90's  Labs: Basic Metabolic Panel: Recent Labs  Lab 09/22/21 0406 09/22/21 0431 09/23/21 0350 09/23/21 0355 09/24/21 0409 09/24/21 0411 09/24/21 1631 09/25/21 0336 09/25/21 0340 09/25/21 1535 09/26/21 0250  NA 130*   < > 130*   < > 133*   133*   < > 133* 132* 135 130* 133*  K 4.2   < > 4.0   < > 4.4   4.4   < > 4.5 4.7 4.7 4.4 4.5  CL 97*   < > 97*   < > 100   100  --  100 101  --  100 98  CO2 22   < > 21*   < > 23   24  --  25 25  --  25 26  GLUCOSE 112*   < > 79   < > 89   91  --  125* 91  --  127*  124*  BUN 50*   < > 60*   < > 37*   38*  --  28* 21  --  20 18  CREATININE 4.27*   < > 5.24*   < > 3.45*   3.40*  --  2.64* 2.38*  --  2.19* 1.97*  CALCIUM 8.0*   < > 8.2*   < > 8.2*   8.2*  --  8.0* 8.2*  --  7.6* 7.9*  MG 2.3  --  2.6*  --  2.5*  --   --  2.4  --   --  2.4  PHOS 4.5  --  5.2*   < > 3.0   3.0  --  2.2* 2.0*  --  2.8 2.8   < > = values in this interval not displayed.    Liver Function Tests: Recent Labs  Lab 09/20/21 0251 09/23/21 1709 09/24/21 0409 09/24/21 1631 09/25/21 0336 09/25/21 1535 09/26/21 0250  AST 86*  --  51*  --   --   --   --   ALT 5  --  <  5  --   --   --   --   ALKPHOS 74  --  154*  --   --   --   --   BILITOT 4.7*  --  3.2*  --   --   --   --   PROT 4.8*  --  5.8*  --   --   --   --   ALBUMIN 2.3*   < > 2.0*   1.9* 1.9* 2.0* 1.9* 1.9*   < > = values in this interval not displayed.   No results for input(s): LIPASE, AMYLASE in the last 168 hours. Recent Labs  Lab 09/20/21 0701  AMMONIA <10    CBC: Recent Labs  Lab 09/20/21 0251 09/20/21 1709 09/21/21 0344 09/21/21 0356 09/22/21 0406 09/22/21 0431 09/23/21 0350 09/23/21 0355 09/24/21 0409 09/24/21 0411 09/25/21 0336 09/25/21 0340 09/26/21 0250  WBC 16.7*   < > 16.6*  --  14.7*  --  13.1*  --  11.0*  --  8.7  --  10.1  NEUTROABS 14.4*  --  14.6*  --  12.4*  --  10.7*  --  9.0*  --   --   --   --   HGB 7.9*   < > 8.3*   < > 8.3*   < > 8.1*   < > 8.2* 8.8* 8.1* 8.5* 7.7*  HCT 21.5*   < > 22.8*   < > 23.4*   < > 23.0*   < > 23.0* 26.0* 23.4* 25.0* 22.3*  MCV 85.0   < > 83.2  --  84.2  --  83.6  --  82.4  --  83.9  --  84.2  PLT 121*   < > 185  --  248  --  345  --  408*  --  479*  --  465*   < > = values in this interval not displayed.    INR: Recent Labs  Lab 09/22/21 0406 09/23/21 0350 09/24/21 0409 09/25/21 0336 09/26/21 0250  INR 1.9* 2.8* 2.9* 2.1* 2.0*    Other results: EKG:   Imaging: DG CHEST PORT 1 VIEW  Result Date: 09/25/2021 CLINICAL DATA:  Fever,  difficulty breathing EXAM: PORTABLE CHEST 1 VIEW COMPARISON:  Previous studies including the examination of 09/21/2021 FINDINGS: Transverse diameter of heart is increased. Central pulmonary vessels are more prominent. There is increased opacification of left lower lung fields. There is interval removal of left chest tube. Tip of PICC line is seen in the region of right superior vena cava. Pacemaker/defibrillator battery is seen in the left infraclavicular region. IMPRESSION: Cardiomegaly. Central pulmonary vessels are more prominent. Increased interstitial markings are seen in parahilar regions and lower lung fields suggesting interstitial edema. There is increased density in the left lower lung fields suggesting increase in pleural effusion and possibly underlying atelectasis/pneumonia. Electronically Signed   By: Elmer Picker M.D.   On: 09/25/2021 08:57     Medications:     Scheduled Medications:  (feeding supplement) PROSource Plus  30 mL Oral TID WC   sodium chloride   Intravenous Once   sodium chloride   Intravenous Once   sodium chloride   Intravenous Once   acetaminophen  500 mg Oral Q6H   aspirin  81 mg Oral Daily   bisacodyl  10 mg Oral Daily   Or   bisacodyl  10 mg Rectal Daily   chlorhexidine gluconate (MEDLINE KIT)  15 mL Mouth Rinse BID  Chlorhexidine Gluconate Cloth  6 each Topical Daily   docusate sodium  200 mg Oral Daily   feeding supplement  237 mL Oral TID BM   mouth rinse  15 mL Mouth Rinse BID   midodrine  5 mg Oral TID WC   montelukast  5 mg Oral QHS   multivitamin  1 tablet Oral QHS   pantoprazole  40 mg Oral Daily   polyethylene glycol  17 g Oral Daily   rosuvastatin  10 mg Oral Daily   sodium chloride flush  10-40 mL Intracatheter Q12H   sodium chloride flush  3 mL Intravenous Q12H   warfarin  2 mg Oral ONCE-1600   Warfarin - Pharmacist Dosing Inpatient   Does not apply q1600    Infusions:   prismasol BGK 4/2.5 400 mL/hr at 09/25/21 1537     prismasol BGK 4/2.5 200 mL/hr at 09/24/21 1500   sodium chloride Stopped (09/20/21 0304)   sodium chloride Stopped (09/18/21 0803)   amiodarone 30 mg/hr (09/26/21 0303)   ceFEPime (MAXIPIME) IV Stopped (09/25/21 2142)   epinephrine 6 mcg/min (09/26/21 0300)   lactated ringers     milrinone 0.125 mcg/kg/min (09/26/21 0300)   norepinephrine (LEVOPHED) Adult infusion Stopped (09/26/21 0246)   prismasol BGK 4/2.5 1,800 mL/hr at 09/25/21 1605   vancomycin      PRN Medications: sodium chloride, albuterol, heparin, heparin, lip balm, melatonin, morphine injection, ondansetron (ZOFRAN) IV, oxyCODONE, polyethylene glycol, sodium chloride flush, sodium chloride flush, sorbitol, traMADol   Assessment:   POD 8 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 Moderate RV systolic dysfunction by echo preop but looked better after LVAD insertion in OR. 2.   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 Decided against removal of ICD/pacer and TV replacement. Improved a little at the end of surgery. 3.  Hx of atrial fib on amio. Has been in and out postop. 4.   Stage 3 CKD with progressive rise in creat up to 5.24 with oliguria, marked volume overload and no response to lasix.   Preop was as low as 1.49 and then increased to 1.7 before surgery after large diuresis the day before. CRRT started for volume removal. Removing 100cc/hr. -1800 yesterday. Wt 19 lbs over preop if accurate. 5.   CAD with PCI of OM1 in 2007 and RCA in 2013. 6.   Acute postop blood loss anemia. Trended down further to 7.7.  7.   Left pleural effusion and LLL compressive atelectasis. 8.   Low grade fevers and elevated procalcitonin with normal WBC ct. Cultures pending.  Plan/Discussion:    He will benefit from transfusion today which will allow more fluid removal, improve Co-ox and make him  feel better overall.  Femoral HD catheter not working well so he needs subclavian or IJ cath per IR. This will improve mobility and decrease infection risk. He would benefit from left thoracentesis at same time.   INR therapeutic. Continue Coumadin per pharmacy.  IS, ambulation, nutrition.  Broad spectrum empiric antibiotics pending cultures.   I reviewed the LVAD parameters from today, and compared the results to the patient's prior recorded data.  No programming changes were made.  The LVAD is functioning within specified parameters.   LVAD equipment check completed and is in good working order.  Back-up equipment present.   Length of Stay: [address]  Gaye Pollack 09/26/2021,  7:54 AM

## 2021-09-26 NOTE — Progress Notes (Signed)
LVAD Coordinator Rounding Note:  Admitted 09/06/21 due to CHF exacerbation to Dr Claris Gladden service. He was started on Milrinone on admission. Febrile n 09/10/21- suspected to have UTI- antibiotics started. Impella 5.5 placed 09/12/21.   HM III LVAD implanted on 09/17/21 by Dr Cyndia Bent under destination therapy criteria. 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   Pt lying in bed. Pt now has Retail banker, he really appreciates this.   CVVH ongoing.  Remains in afib on Amiodarone gtt.    He is on vancomycin/cefepime for low grade fever and possible LLL PNA.He walked yesterday but feels more weak today.  Hgb down to 7.7, has had significant losses through CVVH machine.     Vital signs: Temp: 99.3 HR: 94 afib Doppler Pressure: 92 Radial Art Line: 103/78 (88) O2 Sat: 100% on 4L  Wt: 185.2>196.8>194>189.1>192.2 lbs    LVAD interrogation reveals:  Speed: 5500 Flow: 4.8 Power: 4.1w PI: 2.7 Hematocrit: 22  Alarms: none Events: 70+ PI yesterday; 1 today  Fixed speed: 5500 Low speed limit: 5200  Drive Line: Existing VAD dressing removed and site care performed using sterile technique. Pts wife observed dressing change today. Drive line exit site cleaned with Chlora prep applicators x 2, allowed to dry, and gauze dressing with silver strip applied. Exit site not incorporated, the velour is fully implanted at exit site. 1 suture in place. Scant amount of bloody drainage noted at site and on gauze. Slight redness, no tenderness, foul odor or rash noted. Drive line anchor secure x 2. Daily dressing changes per Nurse Davonna Belling or [place] coordinator. Next dressing change due: 09/27/21.        Labs:  LDH trend: 344>379>370>378>380>331  INR trend: 1.6>1.8>2.8>2.9>2.1>2.0  WBC trend: 14.1>13.6>13.1>11>8.7>10.1  Cr trend: 2.11>2.39>5.24>3.45>2.38>1.97  Hgb trend: 6.5>7.5>8.1>8.8>7.7  Anticoagulation Plan: -INR  Goal: 2.0 - 2.5 -ASA Dose: 81 mg daily   Blood Products:  Intra Op: 09/17/21  1 PLT  4 FFP   4 PRBC  Cellsaver- 803 cc Post Op:  - 09/18/21>> 3 PRBCs - 09/19/21>> 1 PRBC  Device: - [employer] dual -Therapies: OFF  *Will need to be referred to device clinic at discharge*  Arrythmias: 3 episodes of VT that required defib while in OR 09/17/21. Currently on Amiodarone gtt. Hx: afib.   Respiratory: extubated 09/18/21  Infection:  09/10/21>> blood cultures >> No growth 5 days; final 09/12/21>> urine culture >> No growth; final   Drips:  Amiodarone 30 mg/hr Epinephrine 6 mcg/min Milrinone 0.125 mcg/kg/min Levophed 3 mcg/min Vasopressin 0.04-- off   Patient Education: Provided pt's wife with HM III Patient Manual as requested.  Pts wife observed dressing change today and was instructed throughout. She will begin practicing donning sterile gloves this weekend. Gloves provided. Pt has been changing power sources. Pt educated on the need for mask and hat during sterile dressing change today.  Plan/Recommendations:  1. [place] VAD coordinator for drive line or equipment concerns 2. Daily drive line dressing change per Nurse Davonna Belling or [place] coordinator  [name] RN [place] Coordinator  Office: [phone]  24/7 Pager: [phone]

## 2021-09-26 NOTE — Progress Notes (Signed)
Outpatient HF CSW came to check on patient- patient asleep so unable to assess but spoke with RN who reports pt seems to be doing better today- wife has been at bedside but wasn't at time of visit.  CSW will continue to follow during implant inpatient stay and assist as needed  Jorge Ny, [facility] Desk#: [phone] Cell#: [phone]

## 2021-09-26 NOTE — Progress Notes (Signed)
Pt participated in bed mobility, sitting at EOB, sit<>stands and transfer to [facility] in attempt to have a BM. On CRRT throughout session. Requiring min to mod assist for mobility. Wife present and offering encouragement throughout session. Continues to demonstrate some slow processing. VSS.    09/26/21 1607  OT Visit Information  Last OT Received On 09/26/21  Assistance Needed +2 (lines)  PT/OT/SLP Co-Evaluation/Treatment Yes  Reason for Co-Treatment Complexity of the patient's impairments (multi-system involvement)  OT goals addressed during session Strengthening/ROM;ADL's and self-care  History of Present Illness Pt is a 71 y.o. [name] admitted 09/07/21 with near-syncope, nausea, poor oral intake, generalized weakness. Workup for advanced heart failure and persistent inotropic with requirement, cardiogenic shock;. [place] 12/19. Impella placed on 12/23. LVAD placed 12/Isaac. Pt with post op delirium and agitation. CRRT initiated 1/3. PMH includes advanced CHF (LVEF <20%), AICD/PPM, PAF on Eliquis, CKD 3, HTN, HLD.  Precautions  Precautions Fall;Sternal;Other (comment)  Precaution Comments LVAD, CRRT, R fem line (cleared for OOB by MD)  Pain Assessment  Pain Assessment Faces  Faces Pain Scale 6  Pain Location chest  Pain Descriptors / Indicators Discomfort;Grimacing;Guarding  Pain Intervention(s) Monitored during session;Repositioned  Cognition  Arousal/Alertness Awake/alert  Behavior During Therapy WFL for tasks assessed/performed;Flat affect  Overall Cognitive Status Impaired/Different from baseline  Area of Impairment Problem solving  Following Commands Follows one step commands consistently  Problem Solving Requires verbal cues  General Comments continues to require increased time to process and requires verbal cueing  ADL  Overall ADL's  Needs assistance/impaired  Toilet Transfer Minimal assistance;Stand-pivot;BSC/3in1;+2 for safety/equipment  Toileting- Clothing Manipulation and Hygiene  Minimal assistance;Sit to/from stand  Bed Mobility  Overal bed mobility Needs Assistance  Bed Mobility Rolling;Sidelying to Sit;Sit to Sidelying  Rolling Supervision  Sidelying to sit Min assist  Supine to sit Mod assist;+2 for safety/equipment;HOB elevated  Transfers  Overall transfer level Needs assistance  Equipment used None  Transfers Sit to/from Stand;Bed to chair/wheelchair/BSC  Sit to Stand +2 safety/equipment;Mod assist;Min guard;Min assist  Bed to/from chair/wheelchair/BSC transfer type: Stand pivot  General transfer comment Multiple sit<>stands from EOB and BSC, initial modA for trunk elevation, progressing to min guard then minA on subsequent trials  Balance  Overall balance assessment Needs assistance;Mild deficits observed, not formally tested  Sitting-balance support No upper extremity supported;Feet supported  Sitting balance-Leahy Scale Fair  Standing balance support During functional activity  Standing balance-Leahy Scale Fair  Standing balance comment can stand statically without UE support, min assist for dynamic balance with transfer  OT - End of Session  Activity Tolerance Patient tolerated treatment well  Patient left in bed;with call bell/phone within reach;with family/visitor present  Nurse Communication Mobility status  OT Assessment/Plan  OT Plan Frequency remains appropriate;Discharge plan needs to be updated  OT Visit Diagnosis Unsteadiness on feet (R26.81);Muscle weakness (generalized) (M62.81);Other symptoms and signs involving cognitive function;Other (comment);Pain (decreased activity tolerance)  OT Frequency (ACUTE ONLY) Min 2X/week  Follow Up Recommendations Home health OT  Assistance recommended at discharge Frequent or constant Supervision/Assistance  OT Equipment SMO/7MB8  AM-PAC OT "6 Clicks" Daily Activity Outcome Measure (Version 2)  Help from another person eating meals? 4  Help from another person taking care of personal grooming? 3  Help  from another person toileting, which includes using toliet, bedpan, or urinal? 2  Help from another person bathing (including washing, rinsing, drying)? 2  Help from another person to put on and taking off regular upper body clothing? 2  Help from another person  to put on and taking off regular lower body clothing? 1  6 Click Score 14  Progressive Mobility  What is the highest level of mobility based on the progressive mobility assessment? Level 3 (Stands with assist) - Balance while standing  and cannot march in place  Mobility Sit up in bed/chair position for meals;Out of bed for toileting  OT Goal Progression  Progress towards OT goals Progressing toward goals  Acute Rehab OT Goals  OT Goal Formulation With patient  Time For Goal Achievement 10/06/21  Potential to Achieve Goals Good  OT Time Calculation  OT Start Time (ACUTE ONLY) 1518  OT Stop Time (ACUTE ONLY) 1558  OT Time Calculation (min) 40 min  OT General Charges  $OT Visit 1 Visit  OT Treatments  $Therapeutic Activity 8-22 mins  Nestor Lewandowsky, OTR/L Acute Rehabilitation Services Pager: [phone] Office: [phone]

## 2021-09-26 NOTE — Progress Notes (Signed)
Physical Therapy Treatment Patient Details Name: Isaac [name] MRN: [id number] DOB: 25-Feb-1951 Today's Date: 09/26/2021   History of Present Illness Pt is a 71 y.o. male admitted 09/07/21 with near-syncope, nausea, poor oral intake, generalized weakness. Workup for advanced heart failure and persistent inotropic with requirement, cardiogenic shock;. [place] 12/19. Impella placed on 12/23. LVAD placed 12/28. Pt with post-op delirium and agitation. CRRT initiated 1/3. PMH includes advanced CHF (LVEF <20%), AICD/PPM, PAF on Eliquis, CKD 3, HTN, HLD.   PT Comments    Pt progressing with mobility. Pt remained on CRRT this session, therefore focused on bed mobility and transfer training as well as standing therex; pt declined sitting in recliner. Pt moving well, although quick to fatigue requiring increased assist today. Wife present and supportive. Will continue to follow acutely to address established goals.    Recommendations for follow up therapy are one component of a multi-disciplinary discharge planning process, led by the attending physician.  Recommendations may be updated based on patient status, additional functional criteria and insurance authorization.  Follow Up Recommendations  Home health PT     Assistance Recommended at Discharge Frequent or constant Supervision/Assistance       Equipment Recommendations  Rollator (4 wheels);BSC/3in1    Recommendations for Other Services       Precautions / Restrictions Precautions Precautions: Fall;Sternal;Other (comment) Precaution Comments: LVAD, CRRT, R fem line (cleared for OOB by MD)     Mobility  Bed Mobility Overal bed mobility: Needs Assistance Bed Mobility: Rolling;Sidelying to Sit;Sit to Sidelying Rolling: Supervision Sidelying to sit: Min assist Supine to sit: Mod assist;+2 for safety/equipment;HOB elevated   Sit to sidelying: Mod assist General bed mobility comments: cues for log roll technique to maintain sternal  precautions, pt opting to hold heart pillow, minA for trunk elevation from sidelying; modA for BLE management return to supine    Transfers Overall transfer level: Needs assistance Equipment used: None Transfers: Sit to/from Stand;Bed to chair/wheelchair/BSC Sit to Stand: Mod assist;Min assist;Min guard;+2 safety/equipment     Step pivot transfers: Min assist;+2 safety/equipment     General transfer comment: Multiple sit<>stands from EOB and BSC, initial modA progressing to min guard; pt with increased fatigue sitting on BSC requiring modA for trunk elevation; cues for sequencing, including hand placement and use of momentum to power up; side steps at EOB and pivotal steps bed<>recliner without UE support, minA for stability    Ambulation/Gait               General Gait Details:  (RN requesting CRRT remain attached; pt able to perform side steps at EOB and pivotal steps bed<>BSC without UE support, min guard)   Stairs             Wheelchair Mobility    Modified Rankin (Stroke Patients Only)       Balance Overall balance assessment: Needs assistance Sitting-balance support: No upper extremity supported;Feet supported Sitting balance-Leahy Scale: Fair     Standing balance support: During functional activity Standing balance-Leahy Scale: Fair Standing balance comment: can stand statically without UE support, min assist for dynamic balance with transfer                            Cognition Arousal/Alertness: Awake/alert Behavior During Therapy: WFL for tasks assessed/performed;Flat affect Overall Cognitive Status: Impaired/Different from baseline Area of Impairment: Problem solving  Following Commands: Follows one step commands consistently     Problem Solving: Requires verbal cues;Slow processing General Comments: continues to require increased time to process and requires verbal cueing        Exercises Other  Exercises Other Exercises: Repeated sit<>stands, static standing EOB, marching in place (within confines of CRRT/lines)    General Comments General comments (skin integrity, edema, etc.): Pt's wife present and supportive; VSS on 4L O2 [place]. Pt sat on BSC but unable to have BM      Pertinent Vitals/Pain Pain Assessment: Faces Faces Pain Scale: Hurts little more Pain Location: chest upon return to bed Pain Descriptors / Indicators: Discomfort;Grimacing;Guarding Pain Intervention(s): Monitored during session;Repositioned;Other (comment) (pt declined pain meds)    Home Living                          Prior Function            PT Goals (current goals can now be found in the care plan section) Progress towards PT goals: Progressing toward goals    Frequency    Min 3X/week      PT Plan Current plan remains appropriate    Co-evaluation PT/OT/SLP Co-Evaluation/Treatment: Yes Reason for Co-Treatment: Complexity of the patient's impairments (multi-system involvement);For patient/therapist safety   OT goals addressed during session: Strengthening/ROM;ADL's and self-care      AM-PAC PT "6 Clicks" Mobility   Outcome Measure  Help needed turning from your back to your side while in a flat bed without using bedrails?: A Lot Help needed moving from lying on your back to sitting on the side of a flat bed without using bedrails?: A Lot Help needed moving to and from a bed to a chair (including a wheelchair)?: A Lot Help needed standing up from a chair using your arms (e.g., wheelchair or bedside chair)?: A Lot Help needed to walk in hospital room?: A Lot Help needed climbing 3-5 steps with a railing? : A Lot 6 Click Score: 12    End of Session Equipment Utilized During Treatment: Oxygen Activity Tolerance: Patient tolerated treatment well;Patient limited by fatigue Patient left: in bed;with call bell/phone within reach;with family/visitor present Nurse Communication:  Mobility status PT Visit Diagnosis: Unsteadiness on feet (R26.81);Other abnormalities of gait and mobility (R26.89)     Time: 4696-2952 PT Time Calculation (min) (ACUTE ONLY): 40 min  Charges:  $Therapeutic Exercise: 8-22 mins $Therapeutic Activity: 8-22 mins                     Mabeline Caras, PT, DPT Acute Rehabilitation Services  Pager [phone] Office [place] 09/26/2021, 5:29 PM

## 2021-09-27 ENCOUNTER — Inpatient Hospital Stay (HOSPITAL_COMMUNITY): Payer: Medicare HMO

## 2021-09-27 DIAGNOSIS — I5043 Acute on chronic combined systolic (congestive) and diastolic (congestive) heart failure: Secondary | ICD-10-CM | POA: Diagnosis not present

## 2021-09-27 DIAGNOSIS — I5022 Chronic systolic (congestive) heart failure: Secondary | ICD-10-CM | POA: Diagnosis not present

## 2021-09-27 DIAGNOSIS — R570 Cardiogenic shock: Secondary | ICD-10-CM | POA: Diagnosis not present

## 2021-09-27 DIAGNOSIS — Z95811 Presence of heart assist device: Secondary | ICD-10-CM | POA: Diagnosis not present

## 2021-09-27 HISTORY — PX: IR THORACENTESIS ASP PLEURAL SPACE W/IMG GUIDE: IMG5380

## 2021-09-27 HISTORY — PX: IR US GUIDE VASC ACCESS RIGHT: IMG2390

## 2021-09-27 HISTORY — PX: IR FLUORO GUIDE CV LINE RIGHT: IMG2283

## 2021-09-27 LAB — PROCALCITONIN: Procalcitonin: 1.78 ng/mL

## 2021-09-27 LAB — URINE CULTURE: Culture: NO GROWTH

## 2021-09-27 LAB — COOXEMETRY PANEL
Carboxyhemoglobin: 1.5 % (ref 0.5–1.5)
Methemoglobin: 0.9 % (ref 0.0–1.5)
O2 Saturation: 60.4 %
Total hemoglobin: 10.2 g/dL — ABNORMAL LOW (ref 12.0–16.0)

## 2021-09-27 LAB — BPAM RBC
Blood Product Expiration Date: 202302012359
ISSUE DATE / TIME: 202301061123
Unit Type and Rh: 5100

## 2021-09-27 LAB — LACTATE DEHYDROGENASE: LDH: 296 U/L — ABNORMAL HIGH (ref 98–192)

## 2021-09-27 LAB — PROTIME-INR
INR: 2.2 — ABNORMAL HIGH (ref 0.8–1.2)
Prothrombin Time: 24.4 seconds — ABNORMAL HIGH (ref 11.4–15.2)

## 2021-09-27 LAB — RENAL FUNCTION PANEL
Albumin: 1.8 g/dL — ABNORMAL LOW (ref 3.5–5.0)
Albumin: 1.8 g/dL — ABNORMAL LOW (ref 3.5–5.0)
Anion gap: 6 (ref 5–15)
Anion gap: 7 (ref 5–15)
BUN: 19 mg/dL (ref 8–23)
BUN: 19 mg/dL (ref 8–23)
CO2: 27 mmol/L (ref 22–32)
CO2: 25 mmol/L (ref 22–32)
Calcium: 7.8 mg/dL — ABNORMAL LOW (ref 8.9–10.3)
Calcium: 8 mg/dL — ABNORMAL LOW (ref 8.9–10.3)
Chloride: 102 mmol/L (ref 98–111)
Chloride: 104 mmol/L (ref 98–111)
Creatinine, Ser: 1.78 mg/dL — ABNORMAL HIGH (ref 0.61–1.24)
Creatinine, Ser: 1.68 mg/dL — ABNORMAL HIGH (ref 0.61–1.24)
GFR, Estimated: 41 mL/min — ABNORMAL LOW (ref >60)
GFR, Estimated: 44 mL/min — ABNORMAL LOW (ref >60)
Glucose, Bld: 170 mg/dL — ABNORMAL HIGH (ref 70–99)
Glucose, Bld: 116 mg/dL — ABNORMAL HIGH (ref 70–99)
Phosphorus: 2.2 mg/dL — ABNORMAL LOW (ref 2.5–4.6)
Phosphorus: 1.9 mg/dL — ABNORMAL LOW (ref 2.5–4.6)
Potassium: 4.5 mmol/L (ref 3.5–5.1)
Potassium: 4.5 mmol/L (ref 3.5–5.1)
Sodium: 135 mmol/L (ref 135–145)
Sodium: 136 mmol/L (ref 135–145)

## 2021-09-27 LAB — CBC
HCT: 24.9 % — ABNORMAL LOW (ref 39.0–52.0)
Hemoglobin: 8.6 g/dL — ABNORMAL LOW (ref 13.0–17.0)
MCH: 29.2 pg (ref 26.0–34.0)
MCHC: 34.5 g/dL (ref 30.0–36.0)
MCV: 84.4 fL (ref 80.0–100.0)
Platelets: 496 10*3/uL — ABNORMAL HIGH (ref 150–400)
RBC: 2.95 MIL/uL — ABNORMAL LOW (ref 4.22–5.81)
RDW: 18.7 % — ABNORMAL HIGH (ref 11.5–15.5)
WBC: 13.5 10*3/uL — ABNORMAL HIGH (ref 4.0–10.5)
nRBC: 0 % (ref 0.0–0.2)

## 2021-09-27 LAB — BODY FLUID CELL COUNT WITH DIFFERENTIAL
Lymphs, Fluid: 23 %
Monocyte-Macrophage-Serous Fluid: 11 % — ABNORMAL LOW (ref 50–90)
Neutrophil Count, Fluid: 66 % — ABNORMAL HIGH (ref 0–25)
Total Nucleated Cell Count, Fluid: 243 cu mm (ref 0–1000)

## 2021-09-27 LAB — HEPATIC FUNCTION PANEL
ALT: 6 U/L (ref 0–44)
AST: 35 U/L (ref 15–41)
Albumin: 1.8 g/dL — ABNORMAL LOW (ref 3.5–5.0)
Alkaline Phosphatase: 170 U/L — ABNORMAL HIGH (ref 38–126)
Bilirubin, Direct: 0.9 mg/dL — ABNORMAL HIGH (ref 0.0–0.2)
Indirect Bilirubin: 0.8 mg/dL (ref 0.3–0.9)
Total Bilirubin: 1.7 mg/dL — ABNORMAL HIGH (ref 0.3–1.2)
Total Protein: 5.7 g/dL — ABNORMAL LOW (ref 6.5–8.1)

## 2021-09-27 LAB — MAGNESIUM: Magnesium: 2.4 mg/dL (ref 1.7–2.4)

## 2021-09-27 LAB — TYPE AND SCREEN
ABO/RH(D): O POS
Antibody Screen: NEGATIVE
Unit division: 0

## 2021-09-27 LAB — GRAM STAIN

## 2021-09-27 LAB — PROTEIN, PLEURAL OR PERITONEAL FLUID: Total protein, fluid: <3 g/dL

## 2021-09-27 LAB — LACTATE DEHYDROGENASE, PLEURAL OR PERITONEAL FLUID: LD, Fluid: 242 U/L — ABNORMAL HIGH (ref 3–23)

## 2021-09-27 LAB — GLUCOSE, CAPILLARY: Glucose-Capillary: 96 mg/dL (ref 70–99)

## 2021-09-27 MED ORDER — HEPARIN SODIUM (PORCINE) 1000 UNIT/ML IJ SOLN
INTRAMUSCULAR | Status: AC
  Filled 2021-09-27: qty 10

## 2021-09-27 MED ORDER — LIDOCAINE HCL (PF) 1 % IJ SOLN
INTRAMUSCULAR | Status: AC
  Filled 2021-09-27: qty 30

## 2021-09-27 MED ORDER — WARFARIN SODIUM 1 MG PO TABS
1.0000 mg | ORAL_TABLET | Freq: Every day | ORAL | Status: DC
  Administered 2021-09-27 – 2021-10-02 (×6): 1 mg via ORAL
  Filled 2021-09-27 (×6): qty 1

## 2021-09-27 MED ORDER — MIDAZOLAM HCL 2 MG/2ML IJ SOLN
INTRAMUSCULAR | Status: AC
  Filled 2021-09-27: qty 2

## 2021-09-27 MED ORDER — LIDOCAINE HCL (PF) 1 % IJ SOLN
INTRAMUSCULAR | Status: AC | PRN
  Administered 2021-09-27: 10 mL

## 2021-09-27 MED ORDER — WARFARIN SODIUM 2 MG PO TABS: 2.0000 mg | ORAL_TABLET | Freq: Every day | ORAL | Status: DC

## 2021-09-27 MED ORDER — FENTANYL CITRATE (PF) 100 MCG/2ML IJ SOLN
INTRAMUSCULAR | Status: AC
  Filled 2021-09-27: qty 2

## 2021-09-27 MED ORDER — GELATIN ABSORBABLE 12-7 MM EX MISC
CUTANEOUS | Status: AC
  Filled 2021-09-27: qty 1

## 2021-09-27 NOTE — Progress Notes (Signed)
Patient ID: Isaac [name], male   DOB: 07/30/1951, 70 y.o.   MRN: [id number] °[facility] 3 Rounding Note ° °Subjective:   ° °Says he feels better this am. Ate fairly well yesterday. Hungry this am but waiting until after dialysis catheter insertion and left thoracentesis. ° °-945 cc/24 hrs. Wt up 6 lbs if accurate. 25 lbs over preop if accurate. ° °Co-ox 60.4 on milrinone 0.125 and epi 6. ° ° °LVAD INTERROGATION:  °[facility] IIl LVAD:  Flow 4.4 liters/min, speed 5500, power 4, PI 4.8.   °Objective:   ° °Vital Signs:   °Temp:  [98.8 °F (37.1 °C)-99.5 °F (37.5 °C)] 99 °F (37.2 °C) (01/07 0800) °Pulse Rate:  [35-173] 150 (01/07 0800) °Resp:  [11-21] 19 (01/07 0800) °BP: (62-114)/(52-102) 114/84 (01/07 0800) °SpO2:  [89 %-100 %] 94 % (01/07 0800) °Arterial Line BP: (90-121)/(58-89) 108/86 (01/07 0800) °Weight:  [90 kg] 90 kg (01/07 0100) °Last BM Date: 09/25/21 °Mean arterial Pressure 80-90 ° °Intake/Output:  ° °Intake/Output Summary (Last 24 hours) at 09/27/2021 0921 °Last data filed at 09/27/2021 0800 °Gross per 24 hour  °Intake 1210.75 ml  °Output 2340 ml  °Net -1129.25 ml  °  ° °Physical Exam: °General:  Well appearing. No resp difficulty °HEENT: normal °Neck: normal °Cor: distant heart sounds with LVAD hum present. °Lungs: decreased BS over LLL. °Abdomen: soft, nontender, nondistended. Good bowel sounds. °Extremities: moderate edema °Neuro: alert & orientedx3, cranial nerves grossly intact. moves all 4 extremities w/o difficulty. Affect pleasant ° °Telemetry: atrial fib 90's on IV amio ° °Labs: °Basic Metabolic Panel: °Recent Labs  °Lab 09/23/21 °0350 09/23/21 °0355 09/24/21 °0409 09/24/21 °0411 09/25/21 °0336 09/25/21 °0340 09/25/21 °1535 09/26/21 °0250 09/26/21 °1628 09/27/21 °0348  °NA 130*   < > 133*   133*   < > 132* 135 130* 133* 134* 135  °K 4.0   < > 4.4   4.4   < > 4.7 4.7 4.4 4.5 4.4 4.5  °CL 97*   < > 100   100   < > 101  --  100 98 103 102  °CO2 21*   < > 23   24   < > 25  --  25 26 23 27  °GLUCOSE 79   <  > 89   91   < > 91  --  127* 124* 116* 170*  °BUN 60*   < > 37*   38*   < > 21  --  20 18 17 19  °CREATININE 5.24*   < > 3.45*   3.40*   < > 2.38*  --  2.19* 1.97* 1.87* 1.78*  °CALCIUM 8.2*   < > 8.2*   8.2*   < > 8.2*  --  7.6* 7.9* 8.1* 7.8*  °MG 2.6*  --  2.5*  --  2.4  --   --  2.4  --  2.4  °PHOS 5.2*   < > 3.0   3.0   < > 2.0*  --  2.8 2.8 2.6 2.2*  ° < > = values in this interval not displayed.  ° ° °Liver Function Tests: °Recent Labs  °Lab 09/24/21 °0409 09/24/21 °1631 09/25/21 °0336 09/25/21 °1535 09/26/21 °0250 09/26/21 °1628 09/27/21 °0348  °AST 51*  --   --   --   --   --  35  °ALT <5  --   --   --   --   --  6  °ALKPHOS 154*  --   --   --   --   --    170*  BILITOT 3.2*  --   --   --   --   --  1.7*  PROT 5.8*  --   --   --   --   --  5.7*  ALBUMIN 2.0*   1.9*   < > 2.0* 1.9* 1.9* 1.9* 1.8*   1.8*   < > = values in this interval not displayed.   No results for input(s): LIPASE, AMYLASE in the last 168 hours. No results for input(s): AMMONIA in the last 168 hours.  CBC: Recent Labs  Lab 09/21/21 0344 09/21/21 0356 09/22/21 0406 09/22/21 0431 09/23/21 0350 09/23/21 0355 09/24/21 0409 09/24/21 0411 09/25/21 0336 09/25/21 0340 09/26/21 0250 09/27/21 0348  WBC 16.6*  --  14.7*  --  13.1*  --  11.0*  --  8.7  --  10.1 13.5*  NEUTROABS 14.6*  --  12.4*  --  10.7*  --  9.0*  --   --   --   --   --   HGB 8.3*   < > 8.3*   < > 8.1*   < > 8.2* 8.8* 8.1* 8.5* 7.7* 8.6*  HCT 22.8*   < > 23.4*   < > 23.0*   < > 23.0* 26.0* 23.4* 25.0* 22.3* 24.9*  MCV 83.2  --  84.2  --  83.6  --  82.4  --  83.9  --  84.2 84.4  PLT 185  --  248  --  345  --  408*  --  479*  --  465* 496*   < > = values in this interval not displayed.    INR: Recent Labs  Lab 09/23/21 0350 09/24/21 0409 09/25/21 0336 09/26/21 0250 09/27/21 0348  INR 2.8* 2.9* 2.1* 2.0* 2.2*    Other results: EKG:   Imaging: No results found.   Medications:     Scheduled Medications:  (feeding supplement) PROSource  Plus  30 mL Oral TID WC   sodium chloride   Intravenous Once   sodium chloride   Intravenous Once   acetaminophen  500 mg Oral Q6H   aspirin  81 mg Oral Daily   bisacodyl  10 mg Oral Daily   Or   bisacodyl  10 mg Rectal Daily   chlorhexidine gluconate (MEDLINE KIT)  15 mL Mouth Rinse BID   Chlorhexidine Gluconate Cloth  6 each Topical Daily   docusate sodium  200 mg Oral Daily   feeding supplement  237 mL Oral TID BM   mouth rinse  15 mL Mouth Rinse BID   midodrine  10 mg Oral TID WC   montelukast  5 mg Oral QHS   multivitamin  1 tablet Oral QHS   pantoprazole  40 mg Oral Daily   polyethylene glycol  17 g Oral Daily   rosuvastatin  10 mg Oral Daily   sodium chloride flush  10-40 mL Intracatheter Q12H   sodium chloride flush  3 mL Intravenous Q12H   Warfarin - Pharmacist Dosing Inpatient   Does not apply q1600    Infusions:   prismasol BGK 4/2.5 400 mL/hr at 09/27/21 0222    prismasol BGK 4/2.5 200 mL/hr at 09/27/21 0222   sodium chloride Stopped (09/20/21 0304)   sodium chloride Stopped (09/18/21 0803)   amiodarone 30 mg/hr (09/27/21 0800)   ceFEPime (MAXIPIME) IV Stopped (09/26/21 2150)   epinephrine 6 mcg/min (09/27/21 0800)   lactated ringers     milrinone 0.125 mcg/kg/min (09/27/21 0800)   norepinephrine (  LEVOPHED) Adult infusion Stopped (09/27/21 0356)  ° prismasol BGK 4/2.5 1,800 mL/hr at 09/27/21 0814  ° ° °PRN Medications: °sodium chloride, albuterol, heparin, heparin, lip balm, melatonin, morphine injection, ondansetron (ZOFRAN) IV, oxyCODONE, polyethylene glycol, sodium chloride flush, sodium chloride flush, sorbitol, traMADol ° ° °Assessment:  ° °POD 9 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 Moderate RV systolic dysfunction by echo preop but looked better after LVAD insertion in OR. °2.   Moderate to severe TR due to ICD lead firmly adhered to septal leaflet with  tethering. Decided against removal of ICD/pacer and TV replacement. Improved a little at the end of surgery. °3.  Hx of atrial fib on amio. Has been in and out postop. °4.   Stage 3 CKD with progressive rise in creat up to 5.24 with oliguria, marked volume overload and no response to lasix.   Preop was as low as 1.49 and then increased to 1.7 before surgery after large diuresis the day before. CRRT started for volume removal. Removing 100cc/hr. -1 L yesterday. Wt 25 lbs over preop if accurate. Wt went up 5 lbs despite removing 1L so probably not accurate. °5.   CAD with PCI of OM1 in 2007 and RCA in 2013. °6.   Acute postop blood loss anemia. Hgb improved after transfusion of 1 unit yesterday.  °7.   Left pleural effusion and LLL compressive atelectasis. Awaiting thoracentesis today.  °8.   Low grade fevers and elevated procalcitonin with WBC trending up slightly to 13.5. BC and UC negative at 2 days. On empiric antibiotics. °  ° °Plan/Discussion:   ° °He has been hemodynamically stable on low dose milrinone and epi for RV support. VAD parameters stable. Avoid any hypotension to maximize the chance kidneys will recover. ° °Planning new HD catheter by IR and left thoracentesis by IR today.  ° °INR therapeutic on low dose Coumadin per pharmacy. ° °Continue nutrition, IS, mobilization. ° °Continue antibiotics for a specific course.  ° °I reviewed the LVAD parameters from today, and compared the results to the patient's prior recorded data.  No programming changes were made.  The LVAD is functioning within specified parameters.   LVAD interrogation was negative for any significant power changes, alarms or PI events/speed drops.  LVAD equipment check completed and is in good working order.  Back-up equipment present.    ° °Length of Stay: 21 ° °Bryan K Bartle °09/27/2021, 9:21 AM  °

## 2021-09-27 NOTE — Progress Notes (Signed)
[facility] Progress Note   71 y.o. 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 Impella was placed on 09/12/2021 and then HM3 on 12/28. CPB time was 161 minutes.  Creatinine was 2.02 12/09/2015 and BL ~1.6-2.    Assessment/ Plan:   Acute kidney injury on CKD3b with a baseline cr in the 1.6-2 range. Acute component either from cardiorenal +/- ATN with decreased renal function from hemodynamics and CPB + hypotensive episodes + pressors. No absolute indication for RRT but he is heading in that direction which I di/w the spouse who was bedside. Weights have increased from 78.4kg to 86.3kg which is c/w worsening edema. CVP ~12. Failed Lasix challenge and CRRT started 1/3. I personally reviewed the urine microscopy and c/w ATN.  Off of CRRT w/ 4K baths pre/post/dialysate; CRRT started on 1/3. Net neg 1.5L/2L/+270m/neg 9427mper 24hr period since CRRT started.  Tolerating UF -> 50-100 ml/hr;  trying to avoid hypotensive episodes which will prolong recovery.   CVP down from 16 to 12.   Rt fem cath, going for IJ TC to help with PT, able to rinse back.   -Cont monitoring daily I/Os + weights  -Maintain MAP>65 for optimal renal perfusion.  -Avoid nephrotoxic medications including NSAIDs, contrast. - Dose medications for a GFR <15 ml/min and also on CRRT.   HFrEF with underlying CM s/p HM3 with CVP in the 12 range with worsening edema despite Lasix but unfortunately renal function worsened requiring CRRT CASHD with h/o PCI last in 2013 - waiting on cath bec of renal compromise.  Anemia Afib  Subjective:   CVP 12 Off CRRT in prep for IJ TC States that his breathing is more comfortable now.    Objective:   BP 114/84    Pulse (!) 150    Temp 99 F (37.2 C)    Resp 19    Ht 5' 9"  (1.753 m)    Wt 90 kg    SpO2 94%    BMI 29.30  kg/m   Intake/Output Summary (Last 24 hours) at 09/27/2021 1025 Last data filed at 09/27/2021 0800 Gross per 24 hour  Intake 1179.3 ml  Output 2239 ml  Net -1059.7 ml   Weight change:   Physical Exam: GEN: NAD, A&Ox3, NCAT HEENT: No conjunctival pallor, EOMI NECK: Supple, JVD present LUNGS: CTA B/L no rales, rhonchi or wheezing CV: LVAD hum ABD: SNDNT +BS  EXT: 1+ lower extremity edema and also present in the UE, UNNA boots ACCESS: rt fem temp   Imaging: No results found.  Labs: BMET Recent Labs  Lab 09/24/21 0409 09/24/21 0411 09/24/21 1631 09/25/21 0336 09/25/21 0340 09/25/21 1535 09/26/21 0250 09/26/21 1628 09/27/21 0348  NA 133*   133*   < > 133* 132* 135 130* 133* 134* 135  K 4.4   4.4   < > 4.5 4.7 4.7 4.4 4.5 4.4 4.5  CL 100   100  --  100 101  --  100 98 103 102  CO2 23   24  --  25 25  --  25 26 23 27   GLUCOSE 89   91  --  125* 91  --  127* 124* 116* 170*  BUN 37*   38*  --  28* 21  --  20 18 17 19   CREATININE 3.45*   3.40*  --  2.64* 2.38*  --  2.19* 1.97* 1.87* 1.78*  CALCIUM 8.2*   8.2*  --  8.0* 8.2*  --  7.6* 7.9* 8.1* 7.8*  PHOS 3.0   3.0  --  2.2* 2.0*  --  2.8 2.8 2.6 2.2*   < > = values in this interval not displayed.   CBC Recent Labs  Lab 09/21/21 0344 09/21/21 0356 09/22/21 0406 09/22/21 0431 09/23/21 0350 09/23/21 0355 09/24/21 0409 09/24/21 0411 09/25/21 0336 09/25/21 0340 09/26/21 0250 09/27/21 0348  WBC 16.6*  --  14.7*  --  13.1*  --  11.0*  --  8.7  --  10.1 13.5*  NEUTROABS 14.6*  --  12.4*  --  10.7*  --  9.0*  --   --   --   --   --   HGB 8.3*   < > 8.3*   < > 8.1*   < > 8.2*   < > 8.1* 8.5* 7.7* 8.6*  HCT 22.8*   < > 23.4*   < > 23.0*   < > 23.0*   < > 23.4* 25.0* 22.3* 24.9*  MCV 83.2  --  84.2  --  83.6  --  82.4  --  83.9  --  84.2 84.4  PLT 185  --  248  --  345  --  408*  --  479*  --  465* 496*   < > = values in this interval not displayed.    Medications:     (feeding supplement) PROSource Plus  30 mL Oral  TID WC   sodium chloride   Intravenous Once   sodium chloride   Intravenous Once   acetaminophen  500 mg Oral Q6H   aspirin  81 mg Oral Daily   bisacodyl  10 mg Oral Daily   Or   bisacodyl  10 mg Rectal Daily   chlorhexidine gluconate (MEDLINE KIT)  15 mL Mouth Rinse BID   Chlorhexidine Gluconate Cloth  6 each Topical Daily   docusate sodium  200 mg Oral Daily   feeding supplement  237 mL Oral TID BM   fentaNYL       gelatin adsorbable       heparin sodium (porcine)       lidocaine (PF)       mouth rinse  15 mL Mouth Rinse BID   midazolam       midodrine  10 mg Oral TID WC   montelukast  5 mg Oral QHS   multivitamin  1 tablet Oral QHS   pantoprazole  40 mg Oral Daily   polyethylene glycol  17 g Oral Daily   rosuvastatin  10 mg Oral Daily   sodium chloride flush  10-40 mL Intracatheter Q12H   sodium chloride flush  3 mL Intravenous Q12H   Warfarin - Pharmacist Dosing Inpatient   Does not apply q1600      Otelia Santee, MD 09/27/2021, 10:25 AM

## 2021-09-27 NOTE — Consult Note (Signed)
Chief Complaint: Patient was seen in consultation today for [facility] placement  Chief Complaint  Patient presents with   Shortness of Breath   syncopal episode   at the request of Marlyce Huge PA-C   Referring Physician(s): Marlyce Huge PA-C   Supervising Physician: Daryll Brod  Patient Status: [facility] - In-pt  History of Present Illness: Semaje Kinker is a 71 y.o. male with PMHs of advanced stage of systolic CHF chronic LVEF less than 20%, status post AICD/PPM, PAF on Eliquis, CKD stage IIIb, HTN, HLD, resented to ED with repeated near syncope, nausea poor intake and generalized weakness. Patient was hospitalized for further evaluation and management, underwent LVAD and femoral arterial line placement in December 2022.  Patient's RF has progressively worsened this admission therefore nephrology was consulted, CRRT was initiated on 09/23/21. The right femoral HD cathter has been malfunctioning occasionally, IR was consulted for subclavian or IJ TDC placement. IR was also consulted for thoracentesis for increased left pleural effusion seen on CXR.   Patient laying in bed, not in acute distress.  Denise headache, fever, chills, shortness of breath, cough, chest pain, abdominal pain, nausea ,vomiting, and bleeding.   Past Medical History:  Diagnosis Date   Arrhythmia    atrial fibrillation   CHF (congestive heart failure) (HCC)    Chronic kidney disease    Coronary artery disease    Hyperlipidemia    Hypertension    Myocardial infarct [facility])     Past Surgical History:  Procedure Laterality Date   CARDIAC DEFIBRILLATOR PLACEMENT  feb 2014   INSERTION OF IMPLANTABLE LEFT VENTRICULAR ASSIST DEVICE N/A 09/17/2021   Procedure: INSERTION OF IMPLANTABLE LEFT VENTRICULAR ASSIST DEVICE AND INSERTION OF FEMORAL ARTERIAL LINE;  Surgeon: Gaye Pollack, MD;  Location: [place];  Service: Open Heart Surgery;  Laterality: N/A;   PLACEMENT OF IMPELLA LEFT VENTRICULAR ASSIST DEVICE N/A  09/12/2021   Procedure: PLACEMENT OF IMPELLA 5.5 LEFT VENTRICULAR ASSIST DEVICE;  Surgeon: Melrose Nakayama, MD;  Location: [place];  Service: Open Heart Surgery;  Laterality: N/A;   RIGHT HEART CATH N/A 09/08/2021   Procedure: RIGHT HEART CATH;  Surgeon: Larey Dresser, MD;  Location: [place] CV LAB;  Service: Cardiovascular;  Laterality: N/A;   TEE WITHOUT CARDIOVERSION N/A 09/12/2021   Procedure: TRANSESOPHAGEAL ECHOCARDIOGRAM (TEE);  Surgeon: Melrose Nakayama, MD;  Location: [place];  Service: Open Heart Surgery;  Laterality: N/A;   TEE WITHOUT CARDIOVERSION N/A 09/17/2021   Procedure: TRANSESOPHAGEAL ECHOCARDIOGRAM (TEE);  Surgeon: Gaye Pollack, MD;  Location: [place];  Service: Open Heart Surgery;  Laterality: N/A;    Allergies: Mushroom extract complex and Neosporin [neomycin-bacitracin zn-polymyx]  Medications: Prior to Admission medications   Medication Sig Start Date End Date Taking? Authorizing Provider  albuterol (VENTOLIN HFA) 108 (90 Base) MCG/ACT inhaler Inhale 2 puffs into the lungs every 6 (six) hours as needed. 05/01/21  Yes [provider]  apixaban (ELIQUIS) 5 MG TABS tablet Take 1 tablet (5 mg total) by mouth 2 (two) times daily. 08/24/21 11/22/21 Yes Dahal, Marlowe Aschoff, MD  furosemide (LASIX) 20 MG tablet Take 1 tablet (20 mg total) by mouth daily as needed for edema or fluid (sob). Patient taking differently: Take 20 mg by mouth daily. 08/24/21 11/22/21 Yes Dahal, Marlowe Aschoff, MD  midodrine (PROAMATINE) 2.5 MG tablet Take 1 tablet (2.5 mg total) by mouth 3 (three) times daily with meals. 08/24/21 11/22/21 Yes Dahal, Marlowe Aschoff, MD  amiodarone (PACERONE) 200 MG tablet Take 1 tablet (200  mg total) by mouth 2 (two) times daily. Patient not taking: Reported on 08/29/2021 08/24/21 11/22/21  Terrilee Croak, MD  metoprolol tartrate (LOPRESSOR) 25 MG tablet Take 0.5 tablets (12.5 mg total) by mouth 2 (two) times daily. Patient not taking: Reported on 08/29/2021 08/24/21 11/22/21  Terrilee Croak, MD  montelukast (SINGULAIR) 10 MG tablet Take by mouth. Patient not taking: Reported on 09/06/2021 10/23/19 10/22/20  [provider]  spironolactone (ALDACTONE) 25 MG tablet Take 0.5 tablets (12.5 mg total) by mouth daily. Patient not taking: Reported on 08/29/2021 08/24/21 11/22/21  Terrilee Croak, MD  tamsulosin (FLOMAX) 0.4 MG CAPS capsule TAKE 1 CAPSULE (0.4 MG TOTAL) BY MOUTH DAILY AFTER BREAKFAST. Patient not taking: Reported on 08/29/2021 04/24/20   Nori Riis, PA-C     History reviewed. No pertinent family history.  Social History   Socioeconomic History   Marital status: Married    Spouse name: Not on file   Number of children: Not on file   Years of education: Not on file   Highest education level: Not on file  Occupational History   Not on file  Tobacco Use   Smoking status: Former   Smokeless tobacco: Never  Substance and Sexual Activity   Alcohol use: No   Drug use: No   Sexual activity: Not on file  Other Topics Concern   Not on file  Social History Narrative   Not on file   Social Determinants of Health   Financial Resource Strain: Not on file  Food Insecurity: Not on file  Transportation Needs: Not on file  Physical Activity: Not on file  Stress: Not on file  Social Connections: Not on file     Review of Systems: A 12 point ROS discussed and pertinent positives are indicated in the HPI above.  All other systems are negative.  Vital Signs: BP 114/84    Pulse (!) 150    Temp 99 F (37.2 C)    Resp 19    Ht 5\' 9"  (1.753 m)    Wt 198 lb 6.6 oz (90 kg)    SpO2 94%    BMI 29.30 kg/m    Physical Exam Vitals reviewed.  Constitutional:      General: He is not in acute distress. HENT:     Head: Normocephalic and atraumatic.     Mouth/Throat:     Mouth: Mucous membranes are moist.     Pharynx: Oropharynx is clear.  Cardiovascular:     Rate and Rhythm: Normal rate and regular rhythm.  Pulmonary:     Effort: Pulmonary effort is normal.      Breath sounds: Normal breath sounds.  Abdominal:     General: Bowel sounds are normal.     Palpations: Abdomen is soft.  Skin:    General: Skin is warm and dry.     Coloration: Skin is not cyanotic or pale.  Neurological:     Mental Status: He is alert and oriented to person, place, and time.  Psychiatric:        Mood and Affect: Mood normal.        Behavior: Behavior normal.    MD Evaluation Airway: WNL Heart: WNL Abdomen: WNL Chest/ Lungs: WNL ASA  Classification: 3 Mallampati/Airway Score: Two  Imaging: DG Orthopantogram  Result Date: 09/09/2021 CLINICAL DATA:  Possible LVAD placement, initial encounter EXAM: ORTHOPANTOGRAM/PANORAMIC COMPARISON:  None. FINDINGS: Panoramic view of the mandible reveals no acute fracture. No bony abscess is seen. Patient shows 7 teeth  along the anterior portion of the maxilla. No definitive periapical abscess is noted. No other focal abnormality is seen. IMPRESSION: No acute abnormality noted. Electronically Signed   By: Inez Catalina M.D.   On: 09/09/2021 20:50   DG Chest 1 View  Result Date: 09/06/2021 CLINICAL DATA:  Syncope EXAM: CHEST  1 VIEW COMPARISON:  Chest x-ray 08/22/2021 FINDINGS: Cardiomegaly unchanged. Mediastinum appears stable. Calcified plaques in the aortic arch. Left-sided cardiac pacemaker device. Pulmonary vasculature appears within normal limits. Stable small left pleural effusion with associated atelectasis/infiltrate. No new consolidation identified. No pneumothorax. IMPRESSION: Cardiomegaly and stable small left pleural effusion with associated atelectasis/infiltrate. Electronically Signed   By: Ofilia Neas M.D.   On: 09/06/2021 10:24   CARDIAC CATHETERIZATION  Result Date: 09/08/2021 1. Optimized filling pressures. 2. Adequate PAPI. 3. Discrepant cardiac output readings by Fick (good on milrinone) and thermodilution (still low on milrinone).  Luiz Blare will stay in place, will follow CO by Fick and thermo going  forwards, hopefully will be more concordant in the future.   DG CHEST PORT 1 VIEW  Result Date: 09/25/2021 CLINICAL DATA:  Fever, difficulty breathing EXAM: PORTABLE CHEST 1 VIEW COMPARISON:  Previous studies including the examination of 09/21/2021 FINDINGS: Transverse diameter of heart is increased. Central pulmonary vessels are more prominent. There is increased opacification of left lower lung fields. There is interval removal of left chest tube. Tip of PICC line is seen in the region of right superior vena cava. Pacemaker/defibrillator battery is seen in the left infraclavicular region. IMPRESSION: Cardiomegaly. Central pulmonary vessels are more prominent. Increased interstitial markings are seen in parahilar regions and lower lung fields suggesting interstitial edema. There is increased density in the left lower lung fields suggesting increase in pleural effusion and possibly underlying atelectasis/pneumonia. Electronically Signed   By: Elmer Picker M.D.   On: 09/25/2021 08:57   DG CHEST PORT 1 VIEW  Result Date: 09/21/2021 CLINICAL DATA:  Evaluate for pneumonia EXAM: PORTABLE CHEST 1 VIEW COMPARISON:  09/20/2021 FINDINGS: Right arm PICC line tip terminates over the distal SVC. There is a left IJ Cordis with tip terminating in the distal left internal jugular vein. Left chest wall ICD is noted with leads in the right atrial appendage and right ventricle. LVAD device partially imaged projecting over the left cardiac apex. Status post median sternotomy. Bilateral chest tubes are in place. No pneumothorax visualized. Small pleural effusion is identified on the right. Pulmonary vascular congestion appears unchanged. IMPRESSION: 1. Stable support apparatus. 2. Small right pleural effusion with pulmonary vascular congestion suggestive of mild CHF. Electronically Signed   By: Kerby Moors M.D.   On: 09/21/2021 10:07   DG CHEST PORT 1 VIEW  Result Date: 09/20/2021 CLINICAL DATA:  71 year old male  with history of shortness of breath. EXAM: PORTABLE CHEST 1 VIEW COMPARISON:  Chest x-ray 09/19/2021. FINDINGS: Left internal jugular Cordis with tip terminating in the distal left internal jugular vein. Left-sided chest tube with tip projecting over the upper left hemithorax. Right-sided chest tube with tip projecting over the mid right hemithorax. There is a right upper extremity PICC with tip terminating in the superior cavoatrial junction. Left ventricular assist device partially imaged projecting over the cardiac apex. Left-sided pacemaker/AICD with lead tips projecting over the expected location of the right atrium and right ventricular apex. Status post median sternotomy. Bibasilar opacities which may reflect areas of atelectasis and/or consolidation. No definite pleural effusions. No definite pneumothorax. There is cephalization of the pulmonary vasculature and slight indistinctness of  the interstitial markings suggestive of mild pulmonary edema. Mild cardiomegaly. Upper mediastinal contours are within normal limits. Atherosclerotic calcifications are noted in the thoracic aorta. IMPRESSION: 1. Postoperative changes and support apparatus, as above. 2. The appearance the chest suggests probable congestive heart failure, as above. 3. Aortic atherosclerosis. Electronically Signed   By: Vinnie Langton M.D.   On: 09/20/2021 09:56   DG CHEST PORT 1 VIEW  Result Date: 09/19/2021 CLINICAL DATA:  Chest tube present, status post LVAD EXAM: PORTABLE CHEST 1 VIEW COMPARISON:  Chest radiograph 1 day prior FINDINGS: The patient has been extubated. The enteric catheter has been removed. The left IJ Swan-Ganz catheter is in stable position terminating in the right pulmonary artery. The left chest wall cardiac device and associated leads are stable. The LVAD is stable. Mediastinal drains and bilateral chest tubes are stable. Aeration of the lungs is unchanged. There is no new or worsening focal airspace disease. The  left costophrenic angle is not well evaluated due to the LVAD. There is no significant right effusion. There is no pneumothorax. The bones are stable. IMPRESSION: 1. Interval extubation.  Remaining support devices as above. 2. No significant interval change in lung aeration since the study from 1 day prior. Electronically Signed   By: Valetta Mole M.D.   On: 09/19/2021 08:15   DG Chest Port 1 View  Result Date: 09/18/2021 CLINICAL DATA:  Intubated, chest tube present, status post LVAD EXAM: PORTABLE CHEST 1 VIEW COMPARISON:  Chest radiograph 1 day prior FINDINGS: Endotracheal tube tip is approximately 4.9 cm from the carina. The left chest wall cardiac device and associated leads are stable. There is a left IJ Swan-Ganz catheter terminating in the right pulmonary artery. The left ventricular cyst device is stable. Bilateral chest tubes are stable. Presumed mediastinal drain is noted. The enteric catheter courses off the field of view. The side hole is no longer identified. The heart is enlarged, unchanged. The mediastinal contours are stable. Aeration of the lungs is not significantly changed. There is no new or worsening focal airspace disease. There is no significant pleural effusion. There is no pneumothorax. The bones are stable. IMPRESSION: 1. Lines and tubes as above. 2. No significant change in lung aeration. No new or worsening airspace disease. Electronically Signed   By: Valetta Mole M.D.   On: 09/18/2021 08:40   DG Chest Port 1 View  Result Date: 09/17/2021 CLINICAL DATA:  Implantable left ventricular assist device EXAM: PORTABLE CHEST 1 VIEW COMPARISON:  Radiograph 09/12/2021 FINDINGS: Interval median sternotomy with LVAD implantation. Endotracheal tube overlies the upper thoracic trachea. Pulmonary artery catheter tip overlies the right pulmonary artery. Unchanged pacemaker/AICD leads. Bilateral chest tubes are in place. There is a mediastinal drain in place. Orogastric tube side port overlies  the gastroesophageal junction. Impella device has been removed. Mildly enlarged cardiac silhouette. Mild diffuse interstitial opacities. No large pleural effusion. No visible pneumothorax. Right axillary surgical clips. IMPRESSION: Postoperative chest after LVAD placement. Lines and tubes as described above, of note the orogastric tube side port overlies the gastroesophageal junction, recommend advancement. Mild pulmonary edema. Electronically Signed   By: Maurine Simmering M.D.   On: 09/17/2021 16:33   DG Chest Port 1 View  Result Date: 09/12/2021 CLINICAL DATA:  Status post Impella placement EXAM: PORTABLE CHEST 1 VIEW COMPARISON:  09/09/2021 FINDINGS: Lungs are clear. Trace left pleural effusion. Mild left perihilar edema, equivocal. No pneumothorax. Cardiomegaly. Left subclavian ICD. Right IJ Swan-Ganz catheter tip in the right main pulmonary artery.  Right subclavian Impella LVAD overlying the inferior aspect of the left ventricle. Skin staples along the right axilla. IMPRESSION: Right subclavian Impella LVAD in satisfactory position. Additional support apparatus as above. Trace left pleural effusion.  Mild left perihilar edema, equivocal. Electronically Signed   By: Julian Hy M.D.   On: 09/12/2021 11:51   DG CHEST PORT 1 VIEW  Result Date: 09/09/2021 CLINICAL DATA:  Heart failure EXAM: PORTABLE CHEST 1 VIEW COMPARISON:  09/07/2021 FINDINGS: Cardiac shadow is enlarged but stable. Defibrillator is again noted and stable. Swan-Ganz catheter is now noted in the pulmonary outflow tract. Right-sided PICC line is noted extending into the mid superior vena cava. Right jugular central line is noted extending to the cavoatrial junction. No pneumothorax is seen. Slight increase in left retrocardiac opacification is noted when compare with the prior study. IMPRESSION: Increasing left retrocardiac consolidation. Tubes and lines as described above. New jugular line is seen without evidence of pneumothorax.  Electronically Signed   By: Inez Catalina M.D.   On: 09/09/2021 15:44   DG CHEST PORT 1 VIEW  Result Date: 09/07/2021 CLINICAL DATA:  PICC line placement. EXAM: PORTABLE CHEST 1 VIEW COMPARISON:  Chest x-ray 08/27/2021. FINDINGS: There is a new right-sided central venous catheter with distal tip projecting over the mid SVC. There is no pneumothorax. The cardiac silhouette is enlarged, unchanged. Left-sided pacemaker is again noted. There is a stable small left pleural effusion. The lungs are otherwise clear. No acute fractures are seen. IMPRESSION: 1. New right-sided central venous catheter with tip projecting over the mid SVC. 2. Stable cardiomegaly with small left pleural effusion. Electronically Signed   By: Ronney Asters M.D.   On: 09/07/2021 17:56   DG C-Arm 1-60 Min-No Report  Result Date: 09/12/2021 Fluoroscopy was utilized by the requesting physician.  No radiographic interpretation.   DG C-Arm 1-60 Min-No Report  Result Date: 09/12/2021 Fluoroscopy was utilized by the requesting physician.  No radiographic interpretation.   ECHOCARDIOGRAM COMPLETE  Result Date: 09/23/2021    ECHOCARDIOGRAM REPORT   Patient Name:   DAMOND BORCHERS Date of Exam: 09/23/2021 Medical Rec #:  [id number]           Height:       69.0 in Accession #:    [id number]          Weight:       194.0 lb Date of Birth:  12-01-1950            BSA:          2.039 m Patient Age:    18 years            BP:           0/0 mmHg Patient Gender: M                   HR:           121 bpm. Exam Location:  Inpatient Procedure: 2D Echo, Cardiac Doppler and Color Doppler Indications:    CHF-Acute systolic  History:        Patient has prior history of Echocardiogram examinations, most                 recent 09/16/2001. CHF, CAD, Defibrillator; Arrythmias:Atrial                 Fibrillation.  Sonographer:    Clayton Lefort RDCS (AE) Referring Phys: [place] Comments: No subcostal window. HeartMate 3 @  5500 RPM.  Subcostal and SSN windows covered with bandages. IMPRESSIONS  1. Left ventricular ejection fraction, by estimation, is 20 to 25%. The left ventricle has severely decreased function. The left ventricle demonstrates global hypokinesis. There is mild left ventricular hypertrophy. Left ventricular diastolic parameters  are indeterminate. LVAD inflow cannula at apex, not well-visualized.  2. Peak RV-RA gradient 28 mmHg. Right ventricular systolic function is moderately reduced. The right ventricular size is normal.  3. Right atrial size was mildly dilated.  4. The aortic valve opens every 2nd beat. The aortic valve is tricuspid. Aortic valve regurgitation is not visualized. No aortic stenosis is present.  5. The tricuspid valve is abnormal. Tricuspid valve regurgitation is moderate to severe.  6. The mitral valve is normal in structure. No evidence of mitral valve regurgitation. No evidence of mitral stenosis.  7. There is a left pleural effusion.  8. A small pericardial effusion is present, primarily towards the apex. FINDINGS  Left Ventricle: Left ventricular ejection fraction, by estimation, is 20 to 25%. The left ventricle has severely decreased function. The left ventricle demonstrates global hypokinesis. The left ventricular internal cavity size was normal in size. There is mild left ventricular hypertrophy. Left ventricular diastolic parameters are indeterminate. Right Ventricle: Peak RV-RA gradient 28 mmHg. The right ventricular size is normal. No increase in right ventricular wall thickness. Right ventricular systolic function is moderately reduced. Left Atrium: Left atrial size was normal in size. Right Atrium: Right atrial size was mildly dilated. Pericardium: There is a left pleural effusion. A small pericardial effusion is present. Mitral Valve: The mitral valve is normal in structure. No evidence of mitral valve regurgitation. No evidence of mitral valve stenosis. Tricuspid Valve: The tricuspid valve is  abnormal. Tricuspid valve regurgitation is moderate to severe. Aortic Valve: The aortic valve opens every 2nd beat. The aortic valve is tricuspid. Aortic valve regurgitation is not visualized. No aortic stenosis is present. Pulmonic Valve: The pulmonic valve was normal in structure. Pulmonic valve regurgitation is not visualized. Aorta: The aortic root is normal in size and structure. Venous: The inferior vena cava was not well visualized. IAS/Shunts: No atrial level shunt detected by color flow Doppler. Additional Comments: A device lead is visualized in the right ventricle.  LEFT VENTRICLE PLAX 2D LVIDd:         5.10 cm LVIDs:         3.90 cm LV PW:         1.40 cm LV IVS:        1.20 cm LVOT diam:     2.00 cm LVOT Area:     3.14 cm  RIGHT VENTRICLE RV Basal diam:  3.70 cm RV Mid diam:    2.60 cm TAPSE (M-mode): 0.5 cm LEFT ATRIUM             Index        RIGHT ATRIUM           Index LA diam:        3.90 cm 1.91 cm/m   RA Area:     19.80 cm LA Vol (A2C):   42.1 ml 20.64 ml/m  RA Volume:   53.30 ml  26.13 ml/m LA Vol (A4C):   33.3 ml 16.33 ml/m LA Biplane Vol: 36.7 ml 18.00 ml/m  TRICUSPID VALVE TR Peak grad:   27.7 mmHg TR Vmax:        263.00 cm/s  SHUNTS Systemic Diam: 2.00 cm Dalton McleanMD Electronically signed by Franki Monte Signature  Date/Time: 09/23/2021/3:56:29 PM    Final    ECHO INTRAOPERATIVE TEE  Result Date: 09/17/2021  *INTRAOPERATIVE TRANSESOPHAGEAL REPORT *  Patient Name:   HIROYUKI OZANICH Date of Exam: 09/17/2021 Medical Rec #:  [id number]           Height:       69.0 in Accession #:    [id number]          Weight:       173.1 lb Date of Birth:  11/29/1950            BSA:          1.94 m Patient Age:    30 years            BP:           113/97 mmHg Patient Gender: M                   HR:           107 bpm. Exam Location:  Anesthesiology Transesophogeal exam was perform intraoperatively during surgical procedure. Patient was closely monitored under general anesthesia during the  entirety of examination. Indications:     CAD Native Vessel i25.10 Sonographer:     Raquel Sarna Senior RDCS Performing Phys: Suella Broad MD Diagnosing Phys: Suella Broad MD Complications: No known complications during this procedure.                            POST-OP IMPRESSIONS s/p Insertion of Implantable Left Ventricular Assist Device (Heartmate 3) _ Left Ventricle: Heartmate 3 inflow cannula present. Cannula oriented away from the septal wall. Appears to be stable and functioning well. _ Right Ventricle: RV wall motion and function improved. AICD wires present. New PA catheter traversing the RV into the main pulmonary artery. _ Aorta: No dissection noted after cannula removed. Impella no longer present. _ Left Atrium: The left atrium appears unchanged from pre-bypass. _ Left Atrial Appendage: The left atrial appendage appears unchanged from pre-bypass. _ Aortic Valve: Impella no longer present. No significant AI present. Normal valve motion and function. _ Mitral Valve: MR slightly improved. Normal leaflet function. _ Tricuspid Valve: TR mildly improved, normal leaflet function. _ Pulmonic Valve: The pulmonic valve appears unchanged from pre-bypass. _ Interatrial Septum: The interatrial septum appears unchanged from pre-bypass. _ Interventricular Septum: The interventricular septum appears unchanged from pre-bypass. _ Pericardium: The pericardium appears unchanged from pre-bypass. PRE-OP FINDINGS  Left Ventricle: The left ventricle is less than 20%. The cavity size was severely dilated. Left ventricular diffusely hypokinetic. There is no left ventricular hypertrophy. Left ventricular diastolic function could not be evaluated. Well positioned Impella device noted in the LV ventricle. Right Ventricle: The right ventricle has severely reduced systolic function. The cavity was dilated. There is no increase in right ventricular wall thickness. There is no aneurysm seen. AICD wires and PA catheter present. Left Atrium:  Left atrial size was dilated. No left atrial/left atrial appendage thrombus was detected. Left atrial appendage velocity is reduced at less than 40 cm/s. Right Atrium: Right atrial size was dilated. AICD wires and PA catheter present traversing the right atrium. Interatrial Septum: No atrial level shunt detected by color flow Doppler. Agitated saline contrast bubble study was negative, with no evidence of any interatrial shunt. There is no evidence of a patent foramen ovale. Pericardium: There is no evidence of pericardial effusion. There is a moderate pleural effusion in the left lateral region.  Mitral Valve: The mitral valve is normal in structure. Mitral valve regurgitation is moderate by color flow Doppler. The MR jet is centrally-directed. There is no evidence of mitral valve vegetation. There is no evidence of mitral stenosis. Tricuspid Valve: The tricuspid valve was normal in structure. Tricuspid valve regurgitation is severe by color flow Doppler. The jet is directed eccentrically and could be related to ICD wires and PA catheter. No evidence of tricuspid stenosis is present. There is no evidence of tricuspid valve vegetation. Aortic Valve: The aortic valve is tricuspid. Aortic valve regurgitation is trivial by color flow Doppler and likely related to well positioned Impella device. The jet is centrally-directed. There is no stenosis of the aortic valve. There is no evidence of aortic valve vegetation. Pulmonic Valve: The pulmonic valve was normal in structure. Pulmonic valve regurgitation is trivial by color flow Doppler. Aorta: The aortic root and ascending aorta are normal in size and structure. There is evidence of protruding and layered plaque in the descending aorta; Grade II, measuring 2-108mm in size. Pulmonary Artery: The pulmonary artery is of normal size. PA catheter present. Venous: The inferior vena cava was not well visualized. Shunts: There is no evidence of an atrial septal defect.  +--------------+-------++  LEFT VENTRICLE           +--------------+-------++  PLAX 2D                  +--------------+-------++  LVIDd:         7.10 cm   +--------------+-------++  LVIDs:         6.40 cm   +--------------+-------++  LV SV:         55 ml     +--------------+-------++  LV SV Index:   28.08     +--------------+-------++                           +--------------+-------++  Suella Broad MD Electronically signed by Suella Broad MD Signature Date/Time: 09/17/2021/3:58:48 PM    Final    ECHO INTRAOPERATIVE TEE  Result Date: 09/12/2021  *INTRAOPERATIVE TRANSESOPHAGEAL REPORT *  Patient Name:   STEWARD SAMES Date of Exam: 09/12/2021 Medical Rec #:  [id number]           Height:       69.0 in Accession #:    [id number]          Weight:       168.2 lb Date of Birth:  July 01, 1951            BSA:          1.92 m Patient Age:    5 years            BP:           100/55 mmHg Patient Gender: M                   HR:           100 bpm. Exam Location:  Anesthesiology Transesophogeal exam was perform intraoperatively during surgical procedure. Patient was closely monitored under general anesthesia during the entirety of examination. Indications:     I50.23 Acute on chronic systolic (congestive) heart failure Performing Phys: Renold Don MD Diagnosing Phys: Renold Don MD Complications: No known complications during this procedure. POST-OP IMPRESSIONS _ Left Ventricle: Impella in LV cavity, tip 5.1cm from AV. _ Right Ventricle: The right ventricle appears unchanged from pre-bypass. _ Aorta: The  aorta appears unchanged from pre-bypass. _ Left Atrium: The left atrium appears unchanged from pre-bypass. _ Left Atrial Appendage: The left atrial appendage appears unchanged from pre-bypass. _ Aortic Valve: The aortic valve appears unchanged from pre-bypass. _ Mitral Valve: The mitral valve appears unchanged from pre-bypass. _ Tricuspid Valve: The tricuspid valve appears unchanged from pre-bypass. _ Pulmonic Valve:  The pulmonic valve appears unchanged from pre-bypass. _ Interatrial Septum: The interatrial septum appears unchanged from pre-bypass. _ Interventricular Septum: The interventricular septum appears unchanged from pre-bypass. _ Pericardium: The pericardium appears unchanged from pre-bypass. PRE-OP FINDINGS  Left Ventricle: The left ventricle EF under 20%. The cavity size was moderately dilated. Left ventrical global hypokinesis without regional wall motion abnormalities. There is no left ventricular hypertrophy. Right Ventricle: The right ventricle has severely reduced systolic function. The cavity was dialated. There is no increase in right ventricular wall thickness. Left Atrium: Left atrial size was dilated. There is continuous echo contrast seen in the left atrial cavity, left atrial appendage and left ventricle. Right Atrium: Right atrial size was dilated. Interatrial Septum: No atrial level shunt detected by color flow Doppler. Pericardium: There is no evidence of pericardial effusion. Mitral Valve: The mitral valve is normal in structure. Mitral valve regurgitation mild to moderate. There is No evidence of mitral stenosis. Tricuspid Valve: The tricuspid valve was normal in structure. Tricuspid valve regurgitation is severe by color flow Doppler. Aortic Valve: The aortic valve is tricuspid Aortic valve regurgitation was not visualized by color flow Doppler. There is no stenosis of the aortic valve. Pulmonic Valve: The pulmonic valve was normal in structure. Pulmonic valve regurgitation is mild by color flow Doppler. Aorta: The aortic root, ascending aorta and aortic arch are normal in size and structure. Pulmonary Artery: The pulmonary artery is not well seen.  Renold Don MD Electronically signed by Renold Don MD Signature Date/Time: 09/12/2021/10:10:31 AM    Final    VAS US DOPPLER PRE VAD  Result Date: 09/11/2021 PERIOPERATIVE VASCULAR EVALUATION Patient Name:  Lorne Winkels  Date of Exam:    09/11/2021 Medical Rec #: [id number]            Accession #:    [id number] Date of Birth: [birth year]             Patient Gender: M Patient Age:   63 years Exam Location:  [facility] Procedure:      VAS US DOPPLER PRE VAD Referring Phys: Quillian Quince BENSIMHON --------------------------------------------------------------------------------  Indications:      Pre-surgical evaluation for LVAD. Risk Factors:     Hypertension, hyperlipidemia, past history of smoking, prior                   MI, coronary artery disease. Other Factors:    Congestive heart failure. Comparison Study: No prior studies. Performing Technologist: Maudry Mayhew MHA, RVT, RDCS, RDMS  Examination Guidelines: A complete evaluation includes B-mode imaging, spectral Doppler, color Doppler, and power Doppler as needed of all accessible portions of each vessel. Bilateral testing is considered an integral part of a complete examination. Limited examinations for reoccurring indications may be performed as noted.  Right Carotid Findings: +----------+--------+--------+--------+--------+------------------+             PSV cm/s EDV cm/s Stenosis Describe Comments            +----------+--------+--------+--------+--------+------------------+  CCA Prox   54       0                                              +----------+--------+--------+--------+--------+------------------+  CCA Distal 51       12                         intimal thickening  +----------+--------+--------+--------+--------+------------------+  ICA Prox   59       19                                             +----------+--------+--------+--------+--------+------------------+  ICA Distal 71       26                                             +----------+--------+--------+--------+--------+------------------+  ECA        99       0                                              +----------+--------+--------+--------+--------+------------------+  +----------+--------+-------+----------------+------------+             PSV cm/s EDV cms Describe         Arm Pressure  +----------+--------+-------+----------------+------------+  Subclavian 61               Multiphasic, WNL               +----------+--------+-------+----------------+------------+ +---------+--------+--+--------+--+---------+  Vertebral PSV cm/s 41 EDV cm/s 11 Antegrade  +---------+--------+--+--------+--+---------+ Left Carotid Findings: +----------+--------+--------+--------+---------------------+------------------+             PSV cm/s EDV cm/s Stenosis Describe              Comments            +----------+--------+--------+--------+---------------------+------------------+  CCA Prox   53       6                                                           +----------+--------+--------+--------+---------------------+------------------+  CCA Distal 47       10                                      intimal thickening  +----------+--------+--------+--------+---------------------+------------------+  ICA Prox   57       21                smooth and                                                                       heterogenous and mild                     +----------+--------+--------+--------+---------------------+------------------+  ICA Distal 62       24                                                          +----------+--------+--------+--------+---------------------+------------------+  ECA        93       9                                                           +----------+--------+--------+--------+---------------------+------------------+ +----------+--------+--------+----------------+------------+  Subclavian PSV cm/s EDV cm/s Describe         Arm Pressure  +----------+--------+--------+----------------+------------+             72                Multiphasic, WNL 81            +----------+--------+--------+----------------+------------+ +---------+--------+--+--------+--+---------+   Vertebral PSV cm/s 41 EDV cm/s 14 Antegrade  +---------+--------+--+--------+--+---------+  ABI Findings: +---------+------------------+-----+----------+--------------------+  Right     Rt Pressure (mmHg) Index Waveform   Comment               +---------+------------------+-----+----------+--------------------+  Brachial                                      Restricted extremity  +---------+------------------+-----+----------+--------------------+  PTA       109                1.35  triphasic                        +---------+------------------+-----+----------+--------------------+  DP        96                 1.19  monophasic                       +---------+------------------+-----+----------+--------------------+  Great Toe 79                                                        +---------+------------------+-----+----------+--------------------+ +---------+------------------+-----+---------+-------+  Left      Lt Pressure (mmHg) Index Waveform  Comment  +---------+------------------+-----+---------+-------+  Brachial  81                       triphasic          +---------+------------------+-----+---------+-------+  PTA       96                 1.19  triphasic          +---------+------------------+-----+---------+-------+  DP        95                 1.17  biphasic           +---------+------------------+-----+---------+-------+  Great Toe 71                                          +---------+------------------+-----+---------+-------+ +-------+---------------+----------------+  ABI/TBI Today's ABI/TBI Previous ABI/TBI  +-------+---------------+----------------+  Right   1.36/0.98                         +-------+---------------+----------------+  Left    1.19/0.88                         +-------+---------------+----------------+  Summary: Right Carotid: The extracranial vessels were near-normal with only minimal wall                thickening or plaque. Left Carotid: The extracranial vessels were  near-normal with only minimal wall               thickening or plaque. Vertebrals:  Bilateral vertebral arteries demonstrate antegrade flow. Subclavians: Normal flow hemodynamics were seen in bilateral subclavian              arteries.  *See table(s) above for measurements and observations. Right ABI: Resting right ankle-brachial index is within normal range. No evidence of significant right lower extremity arterial disease. The right toe-brachial index is normal. Left ABI: Resting left ankle-brachial index is within normal range. No evidence of significant left lower extremity arterial disease. The left toe-brachial index is normal.  Electronically signed by Servando Snare MD on 09/11/2021 at 1:34:21 PM.    Final    VAS [place] LOWER EXTREMITY VENOUS (DVT)  Result Date: 09/11/2021  Lower Venous DVT Study Patient Name:  KARAN RAMNAUTH  Date of Exam:   09/11/2021 Medical Rec #: [id number]            Accession #:    [id number] Date of Birth: 12/31/1950             Patient Gender: M Patient Age:   5 years Exam Location:  [facility] Procedure:      VAS [place] LOWER EXTREMITY VENOUS (DVT) Referring Phys: Quillian Quince BENSIMHON --------------------------------------------------------------------------------  Indications: Pre-VAD.  Comparison Study: No prior studies. Performing Technologist: Darlin Coco RDMS, RVT  Examination Guidelines: A complete evaluation includes B-mode imaging, spectral Doppler, color Doppler, and power Doppler as needed of all accessible portions of each vessel. Bilateral testing is considered an integral part of a complete examination. Limited examinations for reoccurring indications may be performed as noted. The reflux portion of the exam is performed with the patient in reverse Trendelenburg.  +---------+---------------+---------+-----------+----------+--------------+  RIGHT     Compressibility Phasicity Spontaneity Properties Thrombus Aging   +---------+---------------+---------+-----------+----------+--------------+  CFV       Full            Yes       Yes                                    +---------+---------------+---------+-----------+----------+--------------+  SFJ       Full                                                             +---------+---------------+---------+-----------+----------+--------------+  FV Prox   Full                                                             +---------+---------------+---------+-----------+----------+--------------+  FV Mid    Full                                                             +---------+---------------+---------+-----------+----------+--------------+  FV Distal Full                                                             +---------+---------------+---------+-----------+----------+--------------+  PFV       Full                                                             +---------+---------------+---------+-----------+----------+--------------+  POP       Full            Yes       Yes                                    +---------+---------------+---------+-----------+----------+--------------+  PTV       Full                                                             +---------+---------------+---------+-----------+----------+--------------+  PERO      Full                                                             +---------+---------------+---------+-----------+----------+--------------+  Gastroc   Full                                                             +---------+---------------+---------+-----------+----------+--------------+   +---------+---------------+---------+-----------+----------+--------------+  LEFT      Compressibility Phasicity Spontaneity Properties Thrombus Aging  +---------+---------------+---------+-----------+----------+--------------+  CFV       Full            Yes       Yes                                     +---------+---------------+---------+-----------+----------+--------------+  SFJ       Full                                                             +---------+---------------+---------+-----------+----------+--------------+  FV Prox   Full                                                             +---------+---------------+---------+-----------+----------+--------------+  FV Mid    Full                                                             +---------+---------------+---------+-----------+----------+--------------+  FV Distal Full                                                             +---------+---------------+---------+-----------+----------+--------------+  PFV       Full                                                             +---------+---------------+---------+-----------+----------+--------------+  POP       Full            Yes       Yes                                    +---------+---------------+---------+-----------+----------+--------------+  PTV       Full                                                             +---------+---------------+---------+-----------+----------+--------------+  PERO      Full                                                             +---------+---------------+---------+-----------+----------+--------------+  Gastroc   Full                                                             +---------+---------------+---------+-----------+----------+--------------+     Summary: RIGHT: - There is no evidence of deep vein thrombosis in the lower extremity.  - No cystic structure found in the popliteal fossa.  LEFT: - There is no evidence of deep vein thrombosis in the lower extremity.  - No cystic structure found in the popliteal fossa.  *See table(s) above for measurements and observations. Electronically signed by Servando Snare MD on 09/11/2021 at 1:34:56 PM.    Final    VAS [place] UPPER EXTREMITY VENOUS DUPLEX  Result Date: 09/11/2021 UPPER VENOUS STUDY   Patient Name:  MANI CELESTIN  Date of Exam:   09/11/2021 Medical Rec #: [id number]            Accession #:    [id number]  Date of Birth: Feb 08, 1951             Patient Gender: M Patient Age:   42 years Exam Location:  [facility] Procedure:      VAS [place] UPPER EXTREMITY VENOUS DUPLEX Referring Phys: RAVI AGARWALA --------------------------------------------------------------------------------  Indications: Swelling, possible phlebitis Comparison Study: No prior studies. Performing Technologist: Darlin Coco RDMS, RVT  Examination Guidelines: A complete evaluation includes B-mode imaging, spectral Doppler, color Doppler, and power Doppler as needed of all accessible portions of each vessel. Bilateral testing is considered an integral part of a complete examination. Limited examinations for reoccurring indications may be performed as noted.  Right Findings: +----------+------------+---------+-----------+----------+-------+  RIGHT      Compressible Phasicity Spontaneous Properties Summary  +----------+------------+---------+-----------+----------+-------+  Subclavian                 Yes        Yes                         +----------+------------+---------+-----------+----------+-------+  Left Findings: +----------+------------+---------+-----------+----------+-------+  LEFT       Compressible Phasicity Spontaneous Properties Summary  +----------+------------+---------+-----------+----------+-------+  IJV            Full        Yes        Yes                         +----------+------------+---------+-----------+----------+-------+  Subclavian                 Yes        Yes                         +----------+------------+---------+-----------+----------+-------+  Axillary       Full        Yes        Yes                         +----------+------------+---------+-----------+----------+-------+  Brachial       Full                                                +----------+------------+---------+-----------+----------+-------+  Radial         Full                                               +----------+------------+---------+-----------+----------+-------+  Ulnar          Full                                               +----------+------------+---------+-----------+----------+-------+  Cephalic       Full                                               +----------+------------+---------+-----------+----------+-------+  Basilic        Full                                               +----------+------------+---------+-----------+----------+-------+  Heterogenous area visualized at area of most concern, etiology unknown. May warrant further imaging if clinically indicated.  Summary:  Right: No evidence of thrombosis in the subclavian.  Left: No evidence of deep vein thrombosis in the upper extremity. No evidence of superficial vein thrombosis in the upper extremity.  *See table(s) above for measurements and observations.  Diagnosing physician: Servando Snare MD Electronically signed by Servando Snare MD on 09/11/2021 at 1:34:49 PM.    Final    ECHOCARDIOGRAM LIMITED  Result Date: 09/16/2021    ECHOCARDIOGRAM LIMITED REPORT   Patient Name:   ARMOUR VILLANUEVA Date of Exam: 09/16/2021 Medical Rec #:  [id number]           Height:       69.0 in Accession #:    [id number]          Weight:       170.9 lb Date of Birth:  Oct 16, 1950            BSA:          1.932 m Patient Age:    14 years            BP:           93/75 mmHg Patient Gender: M                   HR:           95 bpm. Exam Location:  Inpatient Procedure: Limited Echo Indications:    impella placement check  History:        Patient has prior history of Echocardiogram examinations, most                 recent 08/23/2021.  Sonographer:    Johny Chess RDCS Referring Phys: [place]  1. Limited echo for impella posiition. Device approximately 4.95 cm distal to the aortic annulus.  2. Left  ventricular ejection fraction, by estimation, is 20 to 25%. The left ventricle has severely decreased function. The left ventricle demonstrates global hypokinesis. The left ventricular internal cavity size was moderately dilated.  3. Left atrial size was moderately dilated.  4. Right atrial size was moderately dilated. FINDINGS  Left Ventricle: Left ventricular ejection fraction, by estimation, is 20 to 25%. The left ventricle has severely decreased function. The left ventricle demonstrates global hypokinesis. The left ventricular internal cavity size was moderately dilated. Left Atrium: Left atrial size was moderately dilated. Right Atrium: Right atrial size was moderately dilated. Pericardium: There is no evidence of pericardial effusion. Additional Comments: Limited echo for impella posiition. Device approximately 4.95 cm distal to the aortic annulus. Jenkins Rouge MD Electronically signed by Jenkins Rouge MD Signature Date/Time: 09/16/2021/12:04:02 PM    Final    [place] EKG SITE RITE  Result Date: 09/19/2021 If Site Rite image not attached, placement could not be confirmed due to current cardiac rhythm.  [place] EKG SITE RITE  Result Date: 09/07/2021 If Site Rite image not attached, placement could not be confirmed due to current cardiac rhythm.  CT CHEST ABDOMEN PELVIS WO CONTRAST  Result Date: 09/10/2021 CLINICAL DATA:  Rule out surgical contraindications for VAD implant EXAM: CT CHEST, ABDOMEN AND PELVIS WITHOUT CONTRAST TECHNIQUE: Multidetector CT imaging of the chest, abdomen and pelvis was performed following the standard protocol without IV contrast. COMPARISON:  CT abdomen pelvis, 03/13/2020 FINDINGS: CT CHEST FINDINGS Cardiovascular: Left chest multi lead pacer. Right upper extremity PICC. Cardiomegaly. Three-vessel coronary artery calcifications and stents. No  pericardial effusion. Enlargement of the main pulmonary artery, measuring up to 3.9 cm in caliber. Mediastinum/Nodes: Enlarged pretracheal  lymph nodes, largest measuring up to 1.9 x 1.8 cm (series 3, image 22). Thyroid gland, trachea, and esophagus demonstrate no significant findings. Lungs/Pleura: Mild centrilobular emphysema. Mild, diffuse bilateral bronchial wall thickening. Interlobular septal thickening throughout. Moderate left pleural effusion associated atelectasis or consolidation. Musculoskeletal: No chest wall mass or suspicious bone lesions identified. CT ABDOMEN PELVIS FINDINGS Hepatobiliary: No solid liver abnormality is seen. Unchanged fluid attenuation cysts or hemangiomata (series 3, image 61). No gallstones, gallbladder wall thickening, or biliary dilatation. Pancreas: Unremarkable. No pancreatic ductal dilatation or surrounding inflammatory changes. Spleen: Normal in size without significant abnormality. Adrenals/Urinary Tract: Stable, benign bilateral adrenal adenomata (series 3, image 69, 75). Kidneys are normal, without renal calculi, solid lesion, or hydronephrosis. Thickening of the urinary bladder wall, likely secondary to chronic outlet obstruction. Stomach/Bowel: Stomach is within normal limits. Appendix appears normal. No evidence of bowel wall thickening, distention, or inflammatory changes. Sigmoid diverticula. Vascular/Lymphatic: Aortic atherosclerosis. No enlarged abdominal or pelvic lymph nodes. Reproductive: Prostatomegaly. Other: Fat and fluid containing left inguinal hernia. Anasarca. Small volume 4 quadrant ascites. Musculoskeletal: No acute or significant osseous findings. IMPRESSION: 1. Moderate left pleural effusion and associated atelectasis or consolidation. 2. Mild pulmonary edema. 3. Mild emphysema. 4. Cardiomegaly and coronary artery disease. 5. Enlargement of the main pulmonary artery, as can be seen in pulmonary hypertension. 6. Small volume ascites and anasarca. 7. Prostatomegaly. Thickening of the urinary bladder wall, likely secondary to chronic outlet obstruction. Aortic Atherosclerosis (ICD10-I70.0)  and Emphysema (ICD10-J43.9). Electronically Signed   By: Delanna Ahmadi M.D.   On: 09/10/2021 08:13    Labs:  CBC: Recent Labs    09/24/21 0409 09/24/21 0411 09/25/21 0336 09/25/21 0340 09/26/21 0250 09/27/21 0348  WBC 11.0*  --  8.7  --  10.1 13.5*  HGB 8.2*   < > 8.1* 8.5* 7.7* 8.6*  HCT 23.0*   < > 23.4* 25.0* 22.3* 24.9*  PLT 408*  --  479*  --  465* 496*   < > = values in this interval not displayed.    COAGS: Recent Labs    09/09/21 1342 09/10/21 0137 09/10/21 1056 09/17/21 1555 09/18/21 0407 09/24/21 0409 09/25/21 0336 09/26/21 0250 09/27/21 0348  INR  --   --   --  1.7*   1.8*   < > 2.9* 2.1* 2.0* 2.2*  APTT 114* 130* 140* 56*   61*  --   --   --   --   --    < > = values in this interval not displayed.    BMP: Recent Labs    09/25/21 1535 09/26/21 0250 09/26/21 1628 09/27/21 0348  NA 130* 133* 134* 135  K 4.4 4.5 4.4 4.5  CL 100 98 103 102  CO2 25 26 23 27   GLUCOSE 127* 124* 116* 170*  BUN 20 18 17 19   CALCIUM 7.6* 7.9* 8.1* 7.8*  CREATININE 2.19* 1.97* 1.87* 1.78*  GFRNONAA 32* 36* 38* 41*    LIVER FUNCTION TESTS: Recent Labs    09/19/21 0358 09/20/21 0251 09/23/21 1709 09/24/21 0409 09/24/21 1631 09/25/21 1535 09/26/21 0250 09/26/21 1628 09/27/21 0348  BILITOT 4.3* 4.7*  --  3.2*  --   --   --   --  1.7*  AST 113* 86*  --  51*  --   --   --   --  35  ALT 9 5  --  <  5  --   --   --   --  6  ALKPHOS 67 74  --  154*  --   --   --   --  170*  PROT 4.5* 4.8*  --  5.8*  --   --   --   --  5.7*  ALBUMIN 2.3* 2.3*   < > 2.0*   1.9*   < > 1.9* 1.9* 1.9* 1.8*   1.8*   < > = values in this interval not displayed.    TUMOR MARKERS: No results for input(s): AFPTM, CEA, CA199, CHROMGRNA in the last 8760 hours.  Assessment and Plan: 71 y.o. male with CHF s/p LVAD placement in December 2022, hx CKD stage 3 and RF progressively worsened during this admission, CRRT was initiated via right femoral HD catheter. The femoral catheter has been  malfunctioning occasionally,  IR was consulted for subclavian or IJ TDC placement. IR was also consulted for thoracentesis for increased left pleural effusion seen on CXR.   Case was reviewed and approved by Dr. Annamaria Boots.  NPO since MN VSS CBC leukocytosis today WBC 13.5; chronic and stable anemia hgb 8.6, thrombocytosis plt 496 - pt on cefepime and vanc due to fever and possible LLL PNA. Blood cx no growth x 2 days - discussed with Dr. Annamaria Boots, ok to proceed with [facility] placement today.   Risks and benefits discussed with the patient including, but not limited to bleeding, infection, vascular injury, pneumothorax which may require chest tube placement, air embolism or even death.   All of the patient's questions were answered, patient is agreeable to proceed. Consent signed and in IR.    Thank you for this interesting consult.  I greatly enjoyed meeting Joanthan Hlavacek and look forward to participating in their care.  A copy of this report was sent to the requesting provider on this date.  Electronically Signed: Tera Mater, PA-C 09/27/2021, 9:32 AM   I spent a total of 40 Minutes    in face to face in clinical consultation, greater than 50% of which was counseling/coordinating care for [facility] placement and thoracentesis.   This chart was dictated using voice recognition software.  Despite best efforts to proofread,  errors can occur which can change the documentation meaning.

## 2021-09-27 NOTE — Progress Notes (Signed)
Drive line dressing change:   LVAD driveline dressing was changed using proper sterile technique. Old dressing removed. Driveline site cleaned using chloroprep x2. Silver strip applied. New gauze dressing applied using sterile technique. Driveline secured at driveline site with 1 suture. Minimal drainage from site, slightly pink around the driveline site, minimal swelling, no tenderness, no odor from drainage. Driveline anchor intact and applied correctly.   NEXT DRESSING CHANGE DUE 09/28/2020.   Alma Friendly RN

## 2021-09-27 NOTE — Sedation Documentation (Signed)
Pt transferred back to his room by pt's nurse. No sedation given for procedure. Pt awake and alert. In no distress

## 2021-09-27 NOTE — Progress Notes (Signed)
Patient ID: Isaac [name], male   DOB: 1950-11-25, 71 y.o.   MRN: [id number]    Advanced Heart Failure VAD Team Note  PCP-Cardiologist: None   Subjective:    12/19 RHC- RA 7, PA 39/14 (25), PCWP 11, CO 6.4 CI 3. Thermo 3.5 1.8. Lasix drip stopped after cath. 12/20 Swan removed.  Norepi 3 mcg added.  12/21 Fever. Blood CX drawn. UA + leukocytes. Started on vanc and cefepime. Diuresed with IV lasix + metolazone.  12/23 Impella 5.5 placed 12/28 HM III LVAD, ICD leads plastered to tricuspid valve with severe TR, valve not replaced and some improvement in TR with LVAD placement.  12/29 Extubated 01/03 Started CRRT 01/07  L thoracentesis and placement of TDC  Remains on milrinone 0.125, epi 6. Co-ox 60% CVP 11  Remains on CVVHD. Making < 100cc urine  On cefepime. Vanc off  Feels weak and cold. Denies SOB, orthopnea or PND    LVAD INTERROGATION:  HeartMate III LVAD:   Flow 4.7 liters/min, speed 5500, power 4, PI 3.3.    Objective:    Vital Signs:   Temp:  [98.8 F (37.1 C)-100.2 F (37.9 C)] 98.8 F (37.1 C) (01/07 1400) Pulse Rate:  [44-202] 161 (01/07 1400) Resp:  [12-25] 18 (01/07 1400) BP: (62-114)/(52-102) 101/81 (01/07 1300) SpO2:  [75 %-100 %] 98 % (01/07 1400) Arterial Line BP: (89-121)/(58-89) 89/59 (01/07 1400) Weight:  [90 kg] 90 kg (01/07 0100) Last BM Date: 09/25/21 Mean arterial Pressure  80s  Intake/Output:   Intake/Output Summary (Last 24 hours) at 09/27/2021 1500 Last data filed at 09/27/2021 1400 Gross per 24 hour  Intake 871.56 ml  Output 1775 ml  Net -903.44 ml      Physical Exam   General:  Lying in bed NAD.  HEENT: normal  Neck: supple. JVP to jaw   Carotids 2+ bilat; no bruits. No lymphadenopathy or thryomegaly appreciated. Cor: LVAD hum.   RIJ TDC Lungs: Clear. Abdomen: obese soft, nontender, non-distended. No hepatosplenomegaly. No bruits or masses. Good bowel sounds. Driveline site clean. Anchor in place.  Extremities: no cyanosis,  clubbing, rash. Warm no edema  Neuro: alert & oriented x 3. No focal deficits. Moves all 4 without problem    Telemetry   AF 90s (personally reviewed)   Labs   Basic Metabolic Panel: Recent Labs  Lab 09/23/21 0350 09/23/21 0355 09/24/21 0409 09/24/21 0411 09/25/21 0336 09/25/21 0340 09/25/21 1535 09/26/21 0250 09/26/21 1628 09/27/21 0348  NA 130*   < > 133*   133*   < > 132* 135 130* 133* 134* 135  K 4.0   < > 4.4   4.4   < > 4.7 4.7 4.4 4.5 4.4 4.5  CL 97*   < > 100   100   < > 101  --  100 98 103 102  CO2 21*   < > 23   24   < > 25  --  25 26 23 27   GLUCOSE 79   < > 89   91   < > 91  --  127* 124* 116* 170*  BUN 60*   < > 37*   38*   < > 21  --  20 18 17 19   CREATININE 5.24*   < > 3.45*   3.40*   < > 2.38*  --  2.19* 1.97* 1.87* 1.78*  CALCIUM 8.2*   < > 8.2*   8.2*   < > 8.2*  --  7.6*  7.9* 8.1* 7.8*  MG 2.6*  --  2.5*  --  2.4  --   --  2.4  --  2.4  PHOS 5.2*   < > 3.0   3.0   < > 2.0*  --  2.8 2.8 2.6 2.2*   < > = values in this interval not displayed.     Liver Function Tests: Recent Labs  Lab 09/24/21 0409 09/24/21 1631 09/25/21 0336 09/25/21 1535 09/26/21 0250 09/26/21 1628 09/27/21 0348  AST 51*  --   --   --   --   --  35  ALT <5  --   --   --   --   --  6  ALKPHOS 154*  --   --   --   --   --  170*  BILITOT 3.2*  --   --   --   --   --  1.7*  PROT 5.8*  --   --   --   --   --  5.7*  ALBUMIN 2.0*   1.9*   < > 2.0* 1.9* 1.9* 1.9* 1.8*   1.8*   < > = values in this interval not displayed.    No results for input(s): LIPASE, AMYLASE in the last 168 hours. No results for input(s): AMMONIA in the last 168 hours.   CBC: Recent Labs  Lab 09/21/21 0344 09/21/21 0356 09/22/21 0406 09/22/21 0431 09/23/21 0350 09/23/21 0355 09/24/21 0409 09/24/21 0411 09/25/21 0336 09/25/21 0340 09/26/21 0250 09/27/21 0348  WBC 16.6*  --  14.7*  --  13.1*  --  11.0*  --  8.7  --  10.1 13.5*  NEUTROABS 14.6*  --  12.4*  --  10.7*  --  9.0*  --   --   --   --    --   HGB 8.3*   < > 8.3*   < > 8.1*   < > 8.2* 8.8* 8.1* 8.5* 7.7* 8.6*  HCT 22.8*   < > 23.4*   < > 23.0*   < > 23.0* 26.0* 23.4* 25.0* 22.3* 24.9*  MCV 83.2  --  84.2  --  83.6  --  82.4  --  83.9  --  84.2 84.4  PLT 185  --  248  --  345  --  408*  --  479*  --  465* 496*   < > = values in this interval not displayed.     INR: Recent Labs  Lab 09/23/21 0350 09/24/21 0409 09/25/21 0336 09/26/21 0250 09/27/21 0348  INR 2.8* 2.9* 2.1* 2.0* 2.2*     Other results:   Imaging   DG Chest 1 View  Result Date: 09/27/2021 CLINICAL DATA:  Right internal jugular permanent hemodialysis catheter placement. EXAM: CHEST  1 VIEW COMPARISON:  None. FINDINGS: The right-sided hemodialysis catheter is visualized with the distal tip near the caval atrial junction or just into the right atrium, in good position. The LVAD device and AICD device are in good position. A right PICC line is again identified. The distal tip is obscured by the other support apparatus. No pneumothorax. Stable cardiomegaly, small effusions, and pulmonary edema. No change in the cardiomediastinal silhouette. IMPRESSION: 1. The new right hemodialysis catheter is in good position without pneumothorax. Other support apparatus is in good position. 2. Mild pulmonary edema with probable small effusions. Electronically Signed   By: Dorise Bullion III M.D.   On: 09/27/2021 13:12   IR Fluoro  Guide CV Line Right  Result Date: 09/27/2021 INDICATION: Heart failure, LVAD patient, access for dialysis EXAM: ULTRASOUND GUIDANCE FOR VASCULAR ACCESS RIGHT INTERNAL JUGULAR PERMANENT HEMODIALYSIS CATHETER Date:  09/27/2021 09/27/2021 11:23 am Radiologist:  M. Daryll Brod, MD Guidance:  Ultrasound and fluoroscopic FLUOROSCOPY TIME:  Fluoroscopy Time: 1 minutes 36 seconds (8 mGy). MEDICATIONS: 1% lidocaine local ANESTHESIA/SEDATION: Moderate Sedation Time:  None. The patient was continuously monitored during the procedure by the interventional radiology  nurse under my direct supervision. CONTRAST:  None. COMPLICATIONS: None immediate. PROCEDURE: Informed consent was obtained from the patient following explanation of the procedure, risks, benefits and alternatives. The patient understands, agrees and consents for the procedure. All questions were addressed. A time out was performed. Maximal barrier sterile technique utilized including caps, mask, sterile gowns, sterile gloves, large sterile drape, hand hygiene, and 2% chlorhexidine scrub. Under sterile conditions and local anesthesia, right internal jugular micropuncture venous access was performed with ultrasound. Images were obtained for documentation of the patent right internal jugular vein. A guide wire was inserted followed by a transitional dilator. Next, a 0.035 guidewire was advanced into the IVC with a 5-French catheter. Measurements were obtained from the right venotomy site to the proximal right atrium. In the right infraclavicular chest, a subcutaneous tunnel was created under sterile conditions and local anesthesia. 1% lidocaine with epinephrine was utilized for this. The 23 cm tip to cuff palindrome catheter was tunneled subcutaneously to the venotomy site and inserted into the SVC/RA junction through a valved peel-away sheath. Position was confirmed with fluoroscopy. Images were obtained for documentation. Blood was aspirated from the catheter followed by saline and heparin flushes. The appropriate volume and strength of heparin was instilled in each lumen. Caps were applied. The catheter was secured at the tunnel site with Gelfoam and a pursestring suture. The venotomy site was closed with subcuticular Vicryl suture. Dermabond was applied to the small right neck incision. A dry sterile dressing was applied. The catheter is ready for use. No immediate complications. IMPRESSION: Ultrasound and fluoroscopically guided right internal jugular tunneled hemodialysis catheter (23 cm tip to cuff palindrome  catheter). Electronically Signed   By: Jerilynn Mages.  Shick M.D.   On: 09/27/2021 11:45   IR US Guide Vasc Access Right  Result Date: 09/27/2021 INDICATION: Heart failure, LVAD patient, access for dialysis EXAM: ULTRASOUND GUIDANCE FOR VASCULAR ACCESS RIGHT INTERNAL JUGULAR PERMANENT HEMODIALYSIS CATHETER Date:  09/27/2021 09/27/2021 11:23 am Radiologist:  M. Daryll Brod, MD Guidance:  Ultrasound and fluoroscopic FLUOROSCOPY TIME:  Fluoroscopy Time: 1 minutes 36 seconds (8 mGy). MEDICATIONS: 1% lidocaine local ANESTHESIA/SEDATION: Moderate Sedation Time:  None. The patient was continuously monitored during the procedure by the interventional radiology nurse under my direct supervision. CONTRAST:  None. COMPLICATIONS: None immediate. PROCEDURE: Informed consent was obtained from the patient following explanation of the procedure, risks, benefits and alternatives. The patient understands, agrees and consents for the procedure. All questions were addressed. A time out was performed. Maximal barrier sterile technique utilized including caps, mask, sterile gowns, sterile gloves, large sterile drape, hand hygiene, and 2% chlorhexidine scrub. Under sterile conditions and local anesthesia, right internal jugular micropuncture venous access was performed with ultrasound. Images were obtained for documentation of the patent right internal jugular vein. A guide wire was inserted followed by a transitional dilator. Next, a 0.035 guidewire was advanced into the IVC with a 5-French catheter. Measurements were obtained from the right venotomy site to the proximal right atrium. In the right infraclavicular chest, a subcutaneous tunnel was created  under sterile conditions and local anesthesia. 1% lidocaine with epinephrine was utilized for this. The 23 cm tip to cuff palindrome catheter was tunneled subcutaneously to the venotomy site and inserted into the SVC/RA junction through a valved peel-away sheath. Position was confirmed with  fluoroscopy. Images were obtained for documentation. Blood was aspirated from the catheter followed by saline and heparin flushes. The appropriate volume and strength of heparin was instilled in each lumen. Caps were applied. The catheter was secured at the tunnel site with Gelfoam and a pursestring suture. The venotomy site was closed with subcuticular Vicryl suture. Dermabond was applied to the small right neck incision. A dry sterile dressing was applied. The catheter is ready for use. No immediate complications. IMPRESSION: Ultrasound and fluoroscopically guided right internal jugular tunneled hemodialysis catheter (23 cm tip to cuff palindrome catheter). Electronically Signed   By: Jerilynn Mages.  Shick M.D.   On: 09/27/2021 11:45   IR THORACENTESIS ASP PLEURAL SPACE W/IMG GUIDE  Result Date: 09/27/2021 INDICATION: Heart failure, LVAD patient, large left effusion EXAM: ULTRASOUND GUIDED LEFT THORACENTESIS MEDICATIONS: 1% lidocaine local COMPLICATIONS: None immediate. PROCEDURE: An ultrasound guided thoracentesis was thoroughly discussed with the patient and questions answered. The benefits, risks, alternatives and complications were also discussed. The patient understands and wishes to proceed with the procedure. Written consent was obtained. Ultrasound was performed to localize and mark an adequate pocket of fluid in the left chest. The area was then prepped and draped in the normal sterile fashion. 1% Lidocaine was used for local anesthesia. Under ultrasound guidance a 6 Fr Safe-T-Centesis catheter was introduced. Thoracentesis was performed. The catheter was removed and a dressing applied. FINDINGS: A total of approximately 1 L of amber colored pleural fluid was removed. Samples were sent to the laboratory as requested by the clinical team. IMPRESSION: Successful ultrasound guided left thoracentesis yielding 1 L of pleural fluid. Electronically Signed   By: Jerilynn Mages.  Shick M.D.   On: 09/27/2021 11:43     Medications:      Scheduled Medications:  (feeding supplement) PROSource Plus  30 mL Oral TID WC   sodium chloride   Intravenous Once   sodium chloride   Intravenous Once   acetaminophen  500 mg Oral Q6H   aspirin  81 mg Oral Daily   bisacodyl  10 mg Oral Daily   Or   bisacodyl  10 mg Rectal Daily   chlorhexidine gluconate (MEDLINE KIT)  15 mL Mouth Rinse BID   Chlorhexidine Gluconate Cloth  6 each Topical Daily   docusate sodium  200 mg Oral Daily   feeding supplement  237 mL Oral TID BM   gelatin adsorbable       heparin sodium (porcine)       lidocaine (PF)       mouth rinse  15 mL Mouth Rinse BID   midodrine  10 mg Oral TID WC   montelukast  5 mg Oral QHS   multivitamin  1 tablet Oral QHS   pantoprazole  40 mg Oral Daily   polyethylene glycol  17 g Oral Daily   rosuvastatin  10 mg Oral Daily   sodium chloride flush  10-40 mL Intracatheter Q12H   sodium chloride flush  3 mL Intravenous Q12H   warfarin  1 mg Oral q1600   Warfarin - Pharmacist Dosing Inpatient   Does not apply q1600    Infusions:   prismasol BGK 4/2.5 400 mL/hr at 09/27/21 0222    prismasol BGK 4/2.5 200 mL/hr at 09/27/21  0222   sodium chloride Stopped (09/20/21 0304)   sodium chloride Stopped (09/18/21 0803)   amiodarone 30 mg/hr (09/27/21 1400)   ceFEPime (MAXIPIME) IV Stopped (09/27/21 1321)   epinephrine 5 mcg/min (09/27/21 1400)   lactated ringers     milrinone 0.125 mcg/kg/min (09/27/21 1400)   norepinephrine (LEVOPHED) Adult infusion Stopped (09/27/21 1108)   prismasol BGK 4/2.5 1,800 mL/hr at 09/27/21 0814    PRN Medications: sodium chloride, albuterol, heparin, heparin, lip balm, melatonin, morphine injection, ondansetron (ZOFRAN) IV, oxyCODONE, polyethylene glycol, sodium chloride flush, sodium chloride flush, sorbitol, traMADol   Assessment/Plan:    1. Acute on chronic systolic CHF:  Long-standing cardiomyopathy.  [place].  Echo this admission with EF < 20%, severe LV dilation,  restrictive diastolic function, moderate RV dysfunction, moderate MR, mod-severe TR. Cause of cardiomyopathy is uncertain.  He has a history of CAD, but I do not think that the described CAD from the past could explain his cardiomyopathy, but CAD could have progressed.  With difficulty tolerating GDMT/need for midodrine and cardiorenal syndrome as well as profound volume overload + NYHA class IV symptoms,  concerned for low output HF. Co-ox off milrinone was 36%, milrinone started and increased to 0.375 but CO remained low. NE added and Impella 5.5 placed 12/23. EF 10% on TEE 12/23. s/p HM III VAD on 12/28.  Echo 01/03 with EF 20-25%, RV moderately reduced, septum mid-line.  Co-ox 60% on 0.125 milrinone + 6 Epi. Midodrine 10 tid NE off. VAD interrogated personally. Parameters stable. - Continue slow wean of presssors.  Need to keep MAP ideally >75 to promote renal perfusion/recovery.  - Gentle UF by CVVH, aim for -100 cc/hr negative.  - Warfarin for INR 2-2.5.  INR 2.2 today  - LDH 296 - Continue 81 mg aspirin - Continue UNNA boots 2. Tricuspid regurgitation: Has moderate-severe TR.  Tricuspid repair not done at time of VAD d/t proximity of ICD wires 3. Atrial fibrillation: Persistent since 10/22 based on device interrogation.  Was in junctional rhythm, remains in AF with HR 90s.   - Continue amio at 30/hr. - INR therapeutic 4. AKI on CKD stage 3: Creatinine 2.61 => 3.1 => 3.33 => 4.27 => 5.24.  Gradual rise (was about 1.7 pre-op) since surgery.  He has not been hypotensive post-op, suspect intra-op hypotension led to development of ATN => urine sediment looked like ATN per renal. Now on CVVH. TDC placed 1/7  - Maintain MAP and CO.    - Pulling for -100/hr UF.  - Nephrology following.   5. CAD: History of PCI to OM1 in 2007 and RCA in 2013.  No CP or ACs.  - Deferred cardiac cath due to AKI and plan for VAD - Continue Crestor.  6. ID: Had fever 12/21, PCT 1.03.  Blood cultures negative. ?Phlebitis  at IV site. Post-op fever with elevated WBCs, PCT 2.63 -> 1,78 - Currently on cefepime. Vanc stopped.   7. Constipation: Continue aggressive bowel regimen. improved 8. Hyponatremia: Hypervolemic hyponatremia.  Controlled via CVVH.  9. Anemia: Hgb down to 7.7 -> 8.6 after 1u RBC on 1/6 - continue to follow  10. Confusion/delirium: Initially post-op and started on Precedex.  This has cleared, now up and has been walking halls 11. F/E/N: Advance diet. Ensure ordered. Encouraged PO intake. 12. Left pleural effusion: - s/p thoracentesis this am   I reviewed the LVAD parameters from today, and compared the results to the patient's prior recorded data.  No programming changes were  made.  The LVAD is functioning within specified parameters.  The patient performs LVAD self-test daily.  LVAD interrogation was negative for any significant power changes, alarms or PI events/speed drops.  LVAD equipment check completed and is in good working order.  Back-up equipment present.   LVAD education done on emergency procedures and precautions and reviewed exit site care.  CRITICAL CARE Performed by: Glori Bickers  Total critical care time: 40 minutes  Critical care time was exclusive of separately billable procedures and treating other patients.  Critical care was necessary to treat or prevent imminent or life-threatening deterioration.  Critical care was time spent personally by me on the following activities: development of treatment plan with patient and/or surrogate as well as nursing, discussions with consultants, evaluation of patient's response to treatment, examination of patient, obtaining history from patient or surrogate, ordering and performing treatments and interventions, ordering and review of laboratory studies, ordering and review of radiographic studies, pulse oximetry and re-evaluation of patient's condition.  Quillian Quince Arwa Yero 09/27/2021 3:00 PM

## 2021-09-27 NOTE — Procedures (Signed)
Interventional Radiology Procedure Note  Procedure: [place] left thoracentesis ( 1 liter) labs sent  Rt IJ HD CATH INSERTION    Complications: None  Estimated Blood Loss:  MIN  Findings: TIP SVCRA    Tamera Punt, MD

## 2021-09-27 NOTE — Progress Notes (Signed)
[place] for Warfarin  Indication: LVAD/AF  Allergies  Allergen Reactions   Mushroom Extract Complex Nausea And Vomiting   Neosporin [Neomycin-Bacitracin Zn-Polymyx] Hives    Patient Measurements: Height: 5\' 9"  (175.3 cm) Weight: 90 kg (198 lb 6.6 oz) IBW/kg (Calculated) : 70.7 Heparin Dosing Weight: 83.7 kg  Vital Signs: Temp: 98.8 F (37.1 C) (01/07 1400) BP: 101/81 (01/07 1300) Pulse Rate: 161 (01/07 1400)  Labs: Recent Labs    09/25/21 0336 09/25/21 0340 09/25/21 1535 09/26/21 0250 09/26/21 1628 09/27/21 0348  HGB 8.1* 8.5*  --  7.7*  --  8.6*  HCT 23.4* 25.0*  --  22.3*  --  24.9*  PLT 479*  --   --  465*  --  496*  LABPROT 23.1*  --   --  22.7*  --  24.4*  INR 2.1*  --   --  2.0*  --  2.2*  CREATININE 2.38*  --    < > 1.97* 1.87* 1.78*   < > = values in this interval not displayed.     Estimated Creatinine Clearance: 43.4 mL/min (A) (by C-G formula based on SCr of 1.78 mg/dL (H)).   Medical History: Past Medical History:  Diagnosis Date   Arrhythmia    atrial fibrillation   CHF (congestive heart failure) (HCC)    Chronic kidney disease    Coronary artery disease    Hyperlipidemia    Hypertension    Myocardial infarct [facility])     Assessment: 71 yo male on chronic Eliquis PTA for afib.  Now s/p LVAD placement 09/17/21, pharmacy asked to begin anticoagulation with warfarin today.   INR is therapeutic at 2.2, EPW out. LDH and CBC remain stable.  Goal of Therapy:  INR 2-2.5 Monitor platelets by anticoagulation protocol: Yes   Plan:  -Warfarin 1mg  PO daily  -Daily INR, CBC, LDH    Bonnita Nasuti Pharm.D. CPP, BCPS Clinical Pharmacist [phone] 09/27/2021 2:56 PM   Please check AMION for all [place] numbers 09/27/2021

## 2021-09-28 DIAGNOSIS — R570 Cardiogenic shock: Secondary | ICD-10-CM | POA: Diagnosis not present

## 2021-09-28 DIAGNOSIS — Z95811 Presence of heart assist device: Secondary | ICD-10-CM | POA: Diagnosis not present

## 2021-09-28 LAB — GLUCOSE, CAPILLARY
Glucose-Capillary: 93 mg/dL (ref 70–99)
Glucose-Capillary: 131 mg/dL — ABNORMAL HIGH (ref 70–99)
Glucose-Capillary: 120 mg/dL — ABNORMAL HIGH (ref 70–99)

## 2021-09-28 LAB — RENAL FUNCTION PANEL
Albumin: 1.7 g/dL — ABNORMAL LOW (ref 3.5–5.0)
Albumin: 1.8 g/dL — ABNORMAL LOW (ref 3.5–5.0)
Anion gap: 6 (ref 5–15)
Anion gap: 4 — ABNORMAL LOW (ref 5–15)
BUN: 17 mg/dL (ref 8–23)
BUN: 17 mg/dL (ref 8–23)
CO2: 27 mmol/L (ref 22–32)
CO2: 27 mmol/L (ref 22–32)
Calcium: 8 mg/dL — ABNORMAL LOW (ref 8.9–10.3)
Calcium: 7.8 mg/dL — ABNORMAL LOW (ref 8.9–10.3)
Chloride: 99 mmol/L (ref 98–111)
Chloride: 102 mmol/L (ref 98–111)
Creatinine, Ser: 1.69 mg/dL — ABNORMAL HIGH (ref 0.61–1.24)
Creatinine, Ser: 1.73 mg/dL — ABNORMAL HIGH (ref 0.61–1.24)
GFR, Estimated: 43 mL/min — ABNORMAL LOW (ref >60)
GFR, Estimated: 42 mL/min — ABNORMAL LOW (ref >60)
Glucose, Bld: 124 mg/dL — ABNORMAL HIGH (ref 70–99)
Glucose, Bld: 128 mg/dL — ABNORMAL HIGH (ref 70–99)
Phosphorus: 2 mg/dL — ABNORMAL LOW (ref 2.5–4.6)
Phosphorus: 1.6 mg/dL — ABNORMAL LOW (ref 2.5–4.6)
Potassium: 4.5 mmol/L (ref 3.5–5.1)
Potassium: 4.5 mmol/L (ref 3.5–5.1)
Sodium: 132 mmol/L — ABNORMAL LOW (ref 135–145)
Sodium: 133 mmol/L — ABNORMAL LOW (ref 135–145)

## 2021-09-28 LAB — CBC
HCT: 23.5 % — ABNORMAL LOW (ref 39.0–52.0)
Hemoglobin: 7.7 g/dL — ABNORMAL LOW (ref 13.0–17.0)
MCH: 27.8 pg (ref 26.0–34.0)
MCHC: 32.8 g/dL (ref 30.0–36.0)
MCV: 84.8 fL (ref 80.0–100.0)
Platelets: 449 10*3/uL — ABNORMAL HIGH (ref 150–400)
RBC: 2.77 MIL/uL — ABNORMAL LOW (ref 4.22–5.81)
RDW: 19.2 % — ABNORMAL HIGH (ref 11.5–15.5)
WBC: 14 10*3/uL — ABNORMAL HIGH (ref 4.0–10.5)
nRBC: 0 % (ref 0.0–0.2)

## 2021-09-28 LAB — CBC WITH DIFFERENTIAL/PLATELET
Abs Immature Granulocytes: 0.12 10*3/uL — ABNORMAL HIGH (ref 0.00–0.07)
Basophils Absolute: 0 10*3/uL (ref 0.0–0.1)
Basophils Relative: 0 %
Eosinophils Absolute: 0.8 10*3/uL — ABNORMAL HIGH (ref 0.0–0.5)
Eosinophils Relative: 5 %
HCT: 23.7 % — ABNORMAL LOW (ref 39.0–52.0)
Hemoglobin: 7.7 g/dL — ABNORMAL LOW (ref 13.0–17.0)
Immature Granulocytes: 1 %
Lymphocytes Relative: 5 %
Lymphs Abs: 0.7 10*3/uL (ref 0.7–4.0)
MCH: 28.1 pg (ref 26.0–34.0)
MCHC: 32.5 g/dL (ref 30.0–36.0)
MCV: 86.5 fL (ref 80.0–100.0)
Monocytes Absolute: 1.1 10*3/uL — ABNORMAL HIGH (ref 0.1–1.0)
Monocytes Relative: 8 %
Neutro Abs: 11.5 10*3/uL — ABNORMAL HIGH (ref 1.7–7.7)
Neutrophils Relative %: 81 %
Platelets: 475 10*3/uL — ABNORMAL HIGH (ref 150–400)
RBC: 2.74 MIL/uL — ABNORMAL LOW (ref 4.22–5.81)
RDW: 19.5 % — ABNORMAL HIGH (ref 11.5–15.5)
WBC: 14.2 10*3/uL — ABNORMAL HIGH (ref 4.0–10.5)
nRBC: 0 % (ref 0.0–0.2)

## 2021-09-28 LAB — COOXEMETRY PANEL
Carboxyhemoglobin: 1.6 % — ABNORMAL HIGH (ref 0.5–1.5)
Methemoglobin: 0.9 % (ref 0.0–1.5)
O2 Saturation: 67.1 %
Total hemoglobin: 7.3 g/dL — ABNORMAL LOW (ref 12.0–16.0)

## 2021-09-28 LAB — PROTIME-INR
INR: 2.4 — ABNORMAL HIGH (ref 0.8–1.2)
Prothrombin Time: 25.7 seconds — ABNORMAL HIGH (ref 11.4–15.2)

## 2021-09-28 LAB — LACTATE DEHYDROGENASE: LDH: 281 U/L — ABNORMAL HIGH (ref 98–192)

## 2021-09-28 LAB — MAGNESIUM: Magnesium: 2.4 mg/dL (ref 1.7–2.4)

## 2021-09-28 MED ORDER — VASOPRESSIN 20 UNITS/100 ML INFUSION FOR SHOCK: 0.0000 [IU]/min | INTRAVENOUS | Status: DC

## 2021-09-28 MED ORDER — SORBITOL 70 % SOLN
30.0000 mL | Freq: Once | Status: AC
  Administered 2021-09-28: 30 mL via ORAL
  Filled 2021-09-28: qty 30

## 2021-09-28 MED ORDER — METOCLOPRAMIDE HCL 5 MG/ML IJ SOLN
5.0000 mg | Freq: Three times a day (TID) | INTRAMUSCULAR | Status: DC
  Administered 2021-09-28 – 2021-10-08 (×29): 5 mg via INTRAVENOUS
  Filled 2021-09-28 (×29): qty 2

## 2021-09-28 NOTE — Progress Notes (Signed)
[facility] Progress Note   71 y.o. 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 Impella was placed on 09/12/2021 and then HM3 on 12/28. CPB time was 161 minutes.  Creatinine was 2.02 12/09/2015 and BL ~1.6-2.    Assessment/ Plan:   Acute kidney injury on CKD3b with a baseline cr in the 1.6-2 range. Acute component either from cardiorenal +/- ATN with decreased renal function from hemodynamics and CPB + hypotensive episodes + pressors. No absolute indication for RRT but he is heading in that direction which I di/w the spouse who was bedside. Weights have increased from 78.4kg to 86.3kg which is c/w worsening edema. CVP ~12. Failed Lasix challenge and CRRT started 1/3. I personally reviewed the urine microscopy and c/w ATN.  Off of CRRT w/ 4K baths pre/post/dialysate; CRRT started on 1/3. Net neg 1.5L/2L/+273m/neg 9420m1.4L  per 24hr period since CRRT started.  Tolerating UF -> 50-100 ml/hr;  trying to avoid hypotensive episodes which will prolong recovery.   CVP down from 16 to 12; overnight limited to UF 50-75 because of hypotension.   RIJ TC currently to help with PT.   -Cont monitoring daily I/Os + weights  -Maintain MAP>65 for optimal renal perfusion.  -Avoid nephrotoxic medications including NSAIDs, contrast. - Dose medications for a GFR <15 ml/min and also on CRRT.   HFrEF with underlying CM s/p HM3 with CVP in the 12 range with worsening edema despite Lasix but unfortunately renal function worsened requiring CRRT CASHD with h/o PCI last in 2013 - waiting on cath bec of renal compromise.  Anemia Afib  Subjective:   CVP 12 On CRRT through  RIJ TC States that his breathing is much more comfortable now. Some nausea this am but not currently    Objective:   BP 94/68 (BP Location: Left Arm)    Pulse (!) 144     Temp 98.6 F (37 C)    Resp 20    Ht 5' 9"  (1.753 m)    Wt 86 kg    SpO2 96%    BMI 28.00 kg/m   Intake/Output Summary (Last 24 hours) at 09/28/2021 1158 Last data filed at 09/28/2021 1100 Gross per 24 hour  Intake 1028.26 ml  Output 2267 ml  Net -1238.74 ml   Weight change: -4 kg  Physical Exam: GEN: NAD, A&Ox3, NCAT HEENT: No conjunctival pallor, EOMI NECK: Supple, JVD present LUNGS: CTA B/L no rales, rhonchi or wheezing CV: LVAD hum ABD: SNDNT +BS  EXT: 1+ lower extremity edema and also present in the UE, UNNA boots ACCESS: rt IJ TC   Imaging: DG Chest 1 View  Result Date: 09/27/2021 CLINICAL DATA:  Right internal jugular permanent hemodialysis catheter placement. EXAM: CHEST  1 VIEW COMPARISON:  None. FINDINGS: The right-sided hemodialysis catheter is visualized with the distal tip near the caval atrial junction or just into the right atrium, in good position. The LVAD device and AICD device are in good position. A right PICC line is again identified. The distal tip is obscured by the other support apparatus. No pneumothorax. Stable cardiomegaly, small effusions, and pulmonary edema. No change in the cardiomediastinal silhouette. IMPRESSION: 1. The new right hemodialysis catheter is in good position without pneumothorax. Other support apparatus is in good position. 2. Mild pulmonary edema with probable small effusions. Electronically Signed   By: DaDorise BullionII  M.D.   On: 09/27/2021 13:12   IR Fluoro Guide CV Line Right  Result Date: 09/27/2021 INDICATION: Heart failure, LVAD patient, access for dialysis EXAM: ULTRASOUND GUIDANCE FOR VASCULAR ACCESS RIGHT INTERNAL JUGULAR PERMANENT HEMODIALYSIS CATHETER Date:  09/27/2021 09/27/2021 11:23 am Radiologist:  M. Daryll Brod, MD Guidance:  Ultrasound and fluoroscopic FLUOROSCOPY TIME:  Fluoroscopy Time: 1 minutes 36 seconds (8 mGy). MEDICATIONS: 1% lidocaine local ANESTHESIA/SEDATION: Moderate Sedation Time:  None. The patient was  continuously monitored during the procedure by the interventional radiology nurse under my direct supervision. CONTRAST:  None. COMPLICATIONS: None immediate. PROCEDURE: Informed consent was obtained from the patient following explanation of the procedure, risks, benefits and alternatives. The patient understands, agrees and consents for the procedure. All questions were addressed. A time out was performed. Maximal barrier sterile technique utilized including caps, mask, sterile gowns, sterile gloves, large sterile drape, hand hygiene, and 2% chlorhexidine scrub. Under sterile conditions and local anesthesia, right internal jugular micropuncture venous access was performed with ultrasound. Images were obtained for documentation of the patent right internal jugular vein. A guide wire was inserted followed by a transitional dilator. Next, a 0.035 guidewire was advanced into the IVC with a 5-French catheter. Measurements were obtained from the right venotomy site to the proximal right atrium. In the right infraclavicular chest, a subcutaneous tunnel was created under sterile conditions and local anesthesia. 1% lidocaine with epinephrine was utilized for this. The 23 cm tip to cuff palindrome catheter was tunneled subcutaneously to the venotomy site and inserted into the SVC/RA junction through a valved peel-away sheath. Position was confirmed with fluoroscopy. Images were obtained for documentation. Blood was aspirated from the catheter followed by saline and heparin flushes. The appropriate volume and strength of heparin was instilled in each lumen. Caps were applied. The catheter was secured at the tunnel site with Gelfoam and a pursestring suture. The venotomy site was closed with subcuticular Vicryl suture. Dermabond was applied to the small right neck incision. A dry sterile dressing was applied. The catheter is ready for use. No immediate complications. IMPRESSION: Ultrasound and fluoroscopically guided right  internal jugular tunneled hemodialysis catheter (23 cm tip to cuff palindrome catheter). Electronically Signed   By: Jerilynn Mages.  Shick M.D.   On: 09/27/2021 11:45   IR US Guide Vasc Access Right  Result Date: 09/27/2021 INDICATION: Heart failure, LVAD patient, access for dialysis EXAM: ULTRASOUND GUIDANCE FOR VASCULAR ACCESS RIGHT INTERNAL JUGULAR PERMANENT HEMODIALYSIS CATHETER Date:  09/27/2021 09/27/2021 11:23 am Radiologist:  M. Daryll Brod, MD Guidance:  Ultrasound and fluoroscopic FLUOROSCOPY TIME:  Fluoroscopy Time: 1 minutes 36 seconds (8 mGy). MEDICATIONS: 1% lidocaine local ANESTHESIA/SEDATION: Moderate Sedation Time:  None. The patient was continuously monitored during the procedure by the interventional radiology nurse under my direct supervision. CONTRAST:  None. COMPLICATIONS: None immediate. PROCEDURE: Informed consent was obtained from the patient following explanation of the procedure, risks, benefits and alternatives. The patient understands, agrees and consents for the procedure. All questions were addressed. A time out was performed. Maximal barrier sterile technique utilized including caps, mask, sterile gowns, sterile gloves, large sterile drape, hand hygiene, and 2% chlorhexidine scrub. Under sterile conditions and local anesthesia, right internal jugular micropuncture venous access was performed with ultrasound. Images were obtained for documentation of the patent right internal jugular vein. A guide wire was inserted followed by a transitional dilator. Next, a 0.035 guidewire was advanced into the IVC with a 5-French catheter. Measurements were obtained from the right venotomy site to the proximal right atrium.  In the right infraclavicular chest, a subcutaneous tunnel was created under sterile conditions and local anesthesia. 1% lidocaine with epinephrine was utilized for this. The 23 cm tip to cuff palindrome catheter was tunneled subcutaneously to the venotomy site and inserted into the SVC/RA  junction through a valved peel-away sheath. Position was confirmed with fluoroscopy. Images were obtained for documentation. Blood was aspirated from the catheter followed by saline and heparin flushes. The appropriate volume and strength of heparin was instilled in each lumen. Caps were applied. The catheter was secured at the tunnel site with Gelfoam and a pursestring suture. The venotomy site was closed with subcuticular Vicryl suture. Dermabond was applied to the small right neck incision. A dry sterile dressing was applied. The catheter is ready for use. No immediate complications. IMPRESSION: Ultrasound and fluoroscopically guided right internal jugular tunneled hemodialysis catheter (23 cm tip to cuff palindrome catheter). Electronically Signed   By: Jerilynn Mages.  Shick M.D.   On: 09/27/2021 11:45   IR THORACENTESIS ASP PLEURAL SPACE W/IMG GUIDE  Result Date: 09/27/2021 INDICATION: Heart failure, LVAD patient, large left effusion EXAM: ULTRASOUND GUIDED LEFT THORACENTESIS MEDICATIONS: 1% lidocaine local COMPLICATIONS: None immediate. PROCEDURE: An ultrasound guided thoracentesis was thoroughly discussed with the patient and questions answered. The benefits, risks, alternatives and complications were also discussed. The patient understands and wishes to proceed with the procedure. Written consent was obtained. Ultrasound was performed to localize and mark an adequate pocket of fluid in the left chest. The area was then prepped and draped in the normal sterile fashion. 1% Lidocaine was used for local anesthesia. Under ultrasound guidance a 6 Fr Safe-T-Centesis catheter was introduced. Thoracentesis was performed. The catheter was removed and a dressing applied. FINDINGS: A total of approximately 1 L of amber colored pleural fluid was removed. Samples were sent to the laboratory as requested by the clinical team. IMPRESSION: Successful ultrasound guided left thoracentesis yielding 1 L of pleural fluid. Electronically  Signed   By: Jerilynn Mages.  Shick M.D.   On: 09/27/2021 11:43    Labs: BMET Recent Labs  Lab 09/25/21 0336 09/25/21 0340 09/25/21 1535 09/26/21 0250 09/26/21 1628 09/27/21 0348 09/27/21 1600 09/28/21 0341  NA 132* 135 130* 133* 134* 135 136 132*  K 4.7 4.7 4.4 4.5 4.4 4.5 4.5 4.5  CL 101  --  100 98 103 102 104 99  CO2 25  --  25 26 23 27 25 27   GLUCOSE 91  --  127* 124* 116* 170* 116* 124*  BUN 21  --  20 18 17 19 19 17   CREATININE 2.38*  --  2.19* 1.97* 1.87* 1.78* 1.68* 1.69*  CALCIUM 8.2*  --  7.6* 7.9* 8.1* 7.8* 8.0* 8.0*  PHOS 2.0*  --  2.8 2.8 2.6 2.2* 1.9* 2.0*   CBC Recent Labs  Lab 09/22/21 0406 09/22/21 0431 09/23/21 0350 09/23/21 0355 09/24/21 0409 09/24/21 0411 09/25/21 0336 09/25/21 0340 09/26/21 0250 09/27/21 0348 09/28/21 0341  WBC 14.7*  --  13.1*  --  11.0*  --  8.7  --  10.1 13.5* 14.0*  NEUTROABS 12.4*  --  10.7*  --  9.0*  --   --   --   --   --   --   HGB 8.3*   < > 8.1*   < > 8.2*   < > 8.1* 8.5* 7.7* 8.6* 7.7*  HCT 23.4*   < > 23.0*   < > 23.0*   < > 23.4* 25.0* 22.3* 24.9* 23.5*  MCV  84.2  --  83.6  --  82.4  --  83.9  --  84.2 84.4 84.8  PLT 248  --  345  --  408*  --  479*  --  465* 496* 449*   < > = values in this interval not displayed.    Medications:     (feeding supplement) PROSource Plus  30 mL Oral TID WC   sodium chloride   Intravenous Once   sodium chloride   Intravenous Once   acetaminophen  500 mg Oral Q6H   aspirin  81 mg Oral Daily   bisacodyl  10 mg Oral Daily   Or   bisacodyl  10 mg Rectal Daily   chlorhexidine gluconate (MEDLINE KIT)  15 mL Mouth Rinse BID   Chlorhexidine Gluconate Cloth  6 each Topical Daily   docusate sodium  200 mg Oral Daily   feeding supplement  237 mL Oral TID BM   mouth rinse  15 mL Mouth Rinse BID   midodrine  10 mg Oral TID WC   montelukast  5 mg Oral QHS   multivitamin  1 tablet Oral QHS   pantoprazole  40 mg Oral Daily   polyethylene glycol  17 g Oral Daily   rosuvastatin  10 mg Oral Daily    sodium chloride flush  10-40 mL Intracatheter Q12H   sodium chloride flush  3 mL Intravenous Q12H   warfarin  1 mg Oral q1600   Warfarin - Pharmacist Dosing Inpatient   Does not apply q1600      Otelia Santee, MD 09/28/2021, 11:58 AM

## 2021-09-28 NOTE — Progress Notes (Signed)
[facility] for Warfarin  Indication: LVAD/AF  Allergies  Allergen Reactions   Mushroom Extract Complex Nausea And Vomiting   Neosporin [Neomycin-Bacitracin Zn-Polymyx] Hives    Patient Measurements: Height: 5\' 9"  (175.3 cm) Weight: 86 kg (189 lb 9.5 oz) IBW/kg (Calculated) : 70.7 Heparin Dosing Weight: 83.7 kg  Vital Signs: Temp: 98.8 F (37.1 C) (01/08 1200) BP: 113/91 (01/08 1200) Pulse Rate: 144 (01/08 0800)  Labs: Recent Labs    09/26/21 0250 09/26/21 1628 09/27/21 0348 09/27/21 1600 09/28/21 0341  HGB 7.7*  --  8.6*  --  7.7*  HCT 22.3*  --  24.9*  --  23.5*  PLT 465*  --  496*  --  449*  LABPROT 22.7*  --  24.4*  --  25.7*  INR 2.0*  --  2.2*  --  2.4*  CREATININE 1.97*   < > 1.78* 1.68* 1.69*   < > = values in this interval not displayed.     Estimated Creatinine Clearance: 44.8 mL/min (A) (by C-G formula based on SCr of 1.69 mg/dL (H)).   Medical History: Past Medical History:  Diagnosis Date   Arrhythmia    atrial fibrillation   CHF (congestive heart failure) (HCC)    Chronic kidney disease    Coronary artery disease    Hyperlipidemia    Hypertension    Myocardial infarct [facility])     Assessment: 71 yo male on chronic Eliquis PTA for afib.  Now s/p LVAD placement 09/17/21, pharmacy asked to begin anticoagulation with warfarin today.   INR is therapeutic at 2.4, EPW out. LDH and CBC remain stable no bleeding noted  Goal of Therapy:  INR 2-2.5 Monitor platelets by anticoagulation protocol: Yes   Plan:  -Warfarin 1mg  PO daily  -Daily INR, CBC, LDH    Bonnita Nasuti Pharm.D. CPP, BCPS Clinical Pharmacist [phone] 09/28/2021 1:21 PM   Please check AMION for all [place] numbers 09/28/2021

## 2021-09-28 NOTE — Progress Notes (Signed)
Drive Line dressing change:  VAD dressing changed and site care performed using proper sterile technique. Existing dressing removed. Driveline site cleaned using chloroprep x2. Silver strip applied. New gauze dressing applied. No drainage from site, scant drainage on existing silver strip, slightly redness, no tenderness,no rash, no odor noted. Driveline anchor x2 intact and applied correctly.   NEXT DRESSING CHANGE DUE 09/29/2020  Candyce Churn RN

## 2021-09-28 NOTE — Progress Notes (Addendum)
Patient ID: Isaac [name], male   DOB: Jan 24, 1951, 71 y.o.   MRN: [id number]    Advanced Heart Failure VAD Team Note  PCP-Cardiologist: None   Subjective:    12/19 RHC- RA 7, PA 39/14 (25), PCWP 11, CO 6.4 CI 3. Thermo 3.5 1.8. Lasix drip stopped after cath. 12/20 Swan removed.  Norepi 3 mcg added.  12/21 Fever. Blood CX drawn. UA + leukocytes. Started on vanc and cefepime. Diuresed with IV lasix + metolazone.  12/23 Impella 5.5 placed 12/28 HM III LVAD, ICD leads plastered to tricuspid valve with severe TR, valve not replaced and some improvement in TR with LVAD placement.  12/29 Extubated 01/03 Started CRRT 01/07  L thoracentesis and placement of TDC  Remains on milrinone 0.125, epi 6 -> 4. Co-ox 67% CVP 11-12 Weight trend inaccurate. Weight up 25lbs from pre-VAD.   Remains on CVVHD. Pulling -50-100 (limited by hypotension). Urine output 30cc  On cefepime. Vanc off  Denies SOB, orthopnea or PND. + constipation   LVAD INTERROGATION:  HeartMate III LVAD:   Flow 4.9 liters/min, speed 5500, power 4, PI 4.0 VAD interrogated personally. Parameters stable.     Objective:    Vital Signs:   Temp:  [98.6 F (37 C)-99.5 F (37.5 C)] 98.8 F (37.1 C) (01/08 1200) Pulse Rate:  [29-166] 144 (01/08 0800) Resp:  [10-28] 17 (01/08 1200) BP: (86-117)/(68-95) 113/91 (01/08 1200) SpO2:  [90 %-99 %] 95 % (01/08 1200) Arterial Line BP: (87-117)/(55-88) 105/68 (01/08 1200) Weight:  [86 kg] 86 kg (01/08 0300) Last BM Date: 09/25/21 Mean arterial Pressure  70-80s  Intake/Output:   Intake/Output Summary (Last 24 hours) at 09/28/2021 1252 Last data filed at 09/28/2021 1200 Gross per 24 hour  Intake 1024.89 ml  Output 2390 ml  Net -1365.11 ml      Physical Exam   General:  Lying in bed NAD.  HEENT: normal  Neck: supple. JVP to jaw .  Carotids 2+ bilat; no bruits. No lymphadenopathy or thryomegaly appreciated. Cor: LVAD hum.  Lungs: Clear. Abdomen: obese soft, nontender, +  distended.Hypoactive bowel sounds. Driveline site clean. Anchor in place.  Extremities: no cyanosis, clubbing, rash. Warm 2+ edema  Neuro: alert & oriented x 3. No focal deficits. Moves all 4 without problem   Telemetry   AF 80-90s (personally reviewed)   Labs   Basic Metabolic Panel: Recent Labs  Lab 09/24/21 0409 09/24/21 0411 09/25/21 0336 09/25/21 0340 09/26/21 0250 09/26/21 1628 09/27/21 0348 09/27/21 1600 09/28/21 0341  NA 133*   133*   < > 132*   < > 133* 134* 135 136 132*  K 4.4   4.4   < > 4.7   < > 4.5 4.4 4.5 4.5 4.5  CL 100   100   < > 101   < > 98 103 102 104 99  CO2 23   24   < > 25   < > _0 GLUCOSE 89   91   < > 91   < > 124* 116* 170* 116* 124*  BUN 37*   38*   < > 21   < > _1 CREATININE 3.45*   3.40*   < > 2.38*   < > 1.97* 1.87* 1.78* 1.68* 1.69*  CALCIUM 8.2*   8.2*   < > 8.2*   < > 7.9* 8.1* 7.8* 8.0* 8.0*  MG 2.5*  --  2.4  --  2.4  --  2.4  --  2.4  PHOS 3.0   3.0   < > 2.0*   < > 2.8 2.6 2.2* 1.9* 2.0*   < > = values in this interval not displayed.     Liver Function Tests: Recent Labs  Lab 09/24/21 0409 09/24/21 1631 09/26/21 0250 09/26/21 1628 09/27/21 0348 09/27/21 1600 09/28/21 0341  AST 51*  --   --   --  35  --   --   ALT <5  --   --   --  6  --   --   ALKPHOS 154*  --   --   --  170*  --   --   BILITOT 3.2*  --   --   --  1.7*  --   --   PROT 5.8*  --   --   --  5.7*  --   --   ALBUMIN 2.0*   1.9*   < > 1.9* 1.9* 1.8*   1.8* 1.8* 1.7*   < > = values in this interval not displayed.    No results for input(s): LIPASE, AMYLASE in the last 168 hours. No results for input(s): AMMONIA in the last 168 hours.   CBC: Recent Labs  Lab 09/22/21 0406 09/22/21 0431 09/23/21 0350 09/23/21 0355 09/24/21 0409 09/24/21 0411 09/25/21 0336 09/25/21 0340 09/26/21 0250 09/27/21 0348 09/28/21 0341  WBC 14.7*  --  13.1*  --  11.0*  --  8.7  --  10.1 13.5* 14.0*  NEUTROABS 12.4*  --  10.7*  --  9.0*  --   --    --   --   --   --   HGB 8.3*   < > 8.1*   < > 8.2*   < > 8.1* 8.5* 7.7* 8.6* 7.7*  HCT 23.4*   < > 23.0*   < > 23.0*   < > 23.4* 25.0* 22.3* 24.9* 23.5*  MCV 84.2  --  83.6  --  82.4  --  83.9  --  84.2 84.4 84.8  PLT 248  --  345  --  408*  --  479*  --  465* 496* 449*   < > = values in this interval not displayed.     INR: Recent Labs  Lab 09/24/21 0409 09/25/21 0336 09/26/21 0250 09/27/21 0348 09/28/21 0341  INR 2.9* 2.1* 2.0* 2.2* 2.4*     Other results:   Imaging   DG Chest 1 View  Result Date: 09/27/2021 CLINICAL DATA:  Right internal jugular permanent hemodialysis catheter placement. EXAM: CHEST  1 VIEW COMPARISON:  None. FINDINGS: The right-sided hemodialysis catheter is visualized with the distal tip near the caval atrial junction or just into the right atrium, in good position. The LVAD device and AICD device are in good position. A right PICC line is again identified. The distal tip is obscured by the other support apparatus. No pneumothorax. Stable cardiomegaly, small effusions, and pulmonary edema. No change in the cardiomediastinal silhouette. IMPRESSION: 1. The new right hemodialysis catheter is in good position without pneumothorax. Other support apparatus is in good position. 2. Mild pulmonary edema with probable small effusions. Electronically Signed   By: Dorise Bullion III M.D.   On: 09/27/2021 13:12   IR Fluoro Guide CV Line Right  Result Date: 09/27/2021 INDICATION: Heart failure, LVAD patient, access for dialysis EXAM: ULTRASOUND GUIDANCE FOR VASCULAR ACCESS RIGHT INTERNAL JUGULAR PERMANENT HEMODIALYSIS CATHETER Date:  09/27/2021 09/27/2021 11:23 am Radiologist:  M.  Daryll Brod, MD Guidance:  Ultrasound and fluoroscopic FLUOROSCOPY TIME:  Fluoroscopy Time: 1 minutes 36 seconds (8 mGy). MEDICATIONS: 1% lidocaine local ANESTHESIA/SEDATION: Moderate Sedation Time:  None. The patient was continuously monitored during the procedure by the interventional radiology nurse  under my direct supervision. CONTRAST:  None. COMPLICATIONS: None immediate. PROCEDURE: Informed consent was obtained from the patient following explanation of the procedure, risks, benefits and alternatives. The patient understands, agrees and consents for the procedure. All questions were addressed. A time out was performed. Maximal barrier sterile technique utilized including caps, mask, sterile gowns, sterile gloves, large sterile drape, hand hygiene, and 2% chlorhexidine scrub. Under sterile conditions and local anesthesia, right internal jugular micropuncture venous access was performed with ultrasound. Images were obtained for documentation of the patent right internal jugular vein. A guide wire was inserted followed by a transitional dilator. Next, a 0.035 guidewire was advanced into the IVC with a 5-French catheter. Measurements were obtained from the right venotomy site to the proximal right atrium. In the right infraclavicular chest, a subcutaneous tunnel was created under sterile conditions and local anesthesia. 1% lidocaine with epinephrine was utilized for this. The 23 cm tip to cuff palindrome catheter was tunneled subcutaneously to the venotomy site and inserted into the SVC/RA junction through a valved peel-away sheath. Position was confirmed with fluoroscopy. Images were obtained for documentation. Blood was aspirated from the catheter followed by saline and heparin flushes. The appropriate volume and strength of heparin was instilled in each lumen. Caps were applied. The catheter was secured at the tunnel site with Gelfoam and a pursestring suture. The venotomy site was closed with subcuticular Vicryl suture. Dermabond was applied to the small right neck incision. A dry sterile dressing was applied. The catheter is ready for use. No immediate complications. IMPRESSION: Ultrasound and fluoroscopically guided right internal jugular tunneled hemodialysis catheter (23 cm tip to cuff palindrome  catheter). Electronically Signed   By: Jerilynn Mages.  Shick M.D.   On: 09/27/2021 11:45   IR US Guide Vasc Access Right  Result Date: 09/27/2021 INDICATION: Heart failure, LVAD patient, access for dialysis EXAM: ULTRASOUND GUIDANCE FOR VASCULAR ACCESS RIGHT INTERNAL JUGULAR PERMANENT HEMODIALYSIS CATHETER Date:  09/27/2021 09/27/2021 11:23 am Radiologist:  M. Daryll Brod, MD Guidance:  Ultrasound and fluoroscopic FLUOROSCOPY TIME:  Fluoroscopy Time: 1 minutes 36 seconds (8 mGy). MEDICATIONS: 1% lidocaine local ANESTHESIA/SEDATION: Moderate Sedation Time:  None. The patient was continuously monitored during the procedure by the interventional radiology nurse under my direct supervision. CONTRAST:  None. COMPLICATIONS: None immediate. PROCEDURE: Informed consent was obtained from the patient following explanation of the procedure, risks, benefits and alternatives. The patient understands, agrees and consents for the procedure. All questions were addressed. A time out was performed. Maximal barrier sterile technique utilized including caps, mask, sterile gowns, sterile gloves, large sterile drape, hand hygiene, and 2% chlorhexidine scrub. Under sterile conditions and local anesthesia, right internal jugular micropuncture venous access was performed with ultrasound. Images were obtained for documentation of the patent right internal jugular vein. A guide wire was inserted followed by a transitional dilator. Next, a 0.035 guidewire was advanced into the IVC with a 5-French catheter. Measurements were obtained from the right venotomy site to the proximal right atrium. In the right infraclavicular chest, a subcutaneous tunnel was created under sterile conditions and local anesthesia. 1% lidocaine with epinephrine was utilized for this. The 23 cm tip to cuff palindrome catheter was tunneled subcutaneously to the venotomy site and inserted into the SVC/RA junction through a  valved peel-away sheath. Position was confirmed with  fluoroscopy. Images were obtained for documentation. Blood was aspirated from the catheter followed by saline and heparin flushes. The appropriate volume and strength of heparin was instilled in each lumen. Caps were applied. The catheter was secured at the tunnel site with Gelfoam and a pursestring suture. The venotomy site was closed with subcuticular Vicryl suture. Dermabond was applied to the small right neck incision. A dry sterile dressing was applied. The catheter is ready for use. No immediate complications. IMPRESSION: Ultrasound and fluoroscopically guided right internal jugular tunneled hemodialysis catheter (23 cm tip to cuff palindrome catheter). Electronically Signed   By: Jerilynn Mages.  Shick M.D.   On: 09/27/2021 11:45   IR THORACENTESIS ASP PLEURAL SPACE W/IMG GUIDE  Result Date: 09/27/2021 INDICATION: Heart failure, LVAD patient, large left effusion EXAM: ULTRASOUND GUIDED LEFT THORACENTESIS MEDICATIONS: 1% lidocaine local COMPLICATIONS: None immediate. PROCEDURE: An ultrasound guided thoracentesis was thoroughly discussed with the patient and questions answered. The benefits, risks, alternatives and complications were also discussed. The patient understands and wishes to proceed with the procedure. Written consent was obtained. Ultrasound was performed to localize and mark an adequate pocket of fluid in the left chest. The area was then prepped and draped in the normal sterile fashion. 1% Lidocaine was used for local anesthesia. Under ultrasound guidance a 6 Fr Safe-T-Centesis catheter was introduced. Thoracentesis was performed. The catheter was removed and a dressing applied. FINDINGS: A total of approximately 1 L of amber colored pleural fluid was removed. Samples were sent to the laboratory as requested by the clinical team. IMPRESSION: Successful ultrasound guided left thoracentesis yielding 1 L of pleural fluid. Electronically Signed   By: Jerilynn Mages.  Shick M.D.   On: 09/27/2021 11:43     Medications:      Scheduled Medications:  (feeding supplement) PROSource Plus  30 mL Oral TID WC   sodium chloride   Intravenous Once   sodium chloride   Intravenous Once   acetaminophen  500 mg Oral Q6H   aspirin  81 mg Oral Daily   bisacodyl  10 mg Oral Daily   Or   bisacodyl  10 mg Rectal Daily   chlorhexidine gluconate (MEDLINE KIT)  15 mL Mouth Rinse BID   Chlorhexidine Gluconate Cloth  6 each Topical Daily   docusate sodium  200 mg Oral Daily   feeding supplement  237 mL Oral TID BM   mouth rinse  15 mL Mouth Rinse BID   midodrine  10 mg Oral TID WC   montelukast  5 mg Oral QHS   multivitamin  1 tablet Oral QHS   pantoprazole  40 mg Oral Daily   polyethylene glycol  17 g Oral Daily   rosuvastatin  10 mg Oral Daily   sodium chloride flush  10-40 mL Intracatheter Q12H   sodium chloride flush  3 mL Intravenous Q12H   warfarin  1 mg Oral q1600   Warfarin - Pharmacist Dosing Inpatient   Does not apply q1600    Infusions:   prismasol BGK 4/2.5 400 mL/hr at 09/28/21 0023    prismasol BGK 4/2.5 200 mL/hr at 09/28/21 0023   sodium chloride Stopped (09/20/21 0304)   sodium chloride Stopped (09/18/21 0803)   amiodarone 30 mg/hr (09/28/21 1200)   ceFEPime (MAXIPIME) IV Stopped (09/28/21 1056)   epinephrine 4 mcg/min (09/28/21 1200)   lactated ringers     milrinone 0.125 mcg/kg/min (09/28/21 1200)   norepinephrine (LEVOPHED) Adult infusion Stopped (09/27/21 1108)   prismasol  BGK 4/2.5 1,800 mL/hr at 09/28/21 1144    PRN Medications: sodium chloride, albuterol, heparin, heparin, lip balm, melatonin, morphine injection, ondansetron (ZOFRAN) IV, oxyCODONE, polyethylene glycol, sodium chloride flush, sodium chloride flush, sorbitol, traMADol   Assessment/Plan:    1. Acute on chronic systolic CHF:  Long-standing cardiomyopathy.  [place].  Echo this admission with EF < 20%, severe LV dilation, restrictive diastolic function, moderate RV dysfunction, moderate MR, mod-severe TR.  Cause of cardiomyopathy is uncertain.  He has a history of CAD, but I do not think that the described CAD from the past could explain his cardiomyopathy, but CAD could have progressed.  With difficulty tolerating GDMT/need for midodrine and cardiorenal syndrome as well as profound volume overload + NYHA class IV symptoms,  concerned for low output HF. Co-ox off milrinone was 36%, milrinone started and increased to 0.375 but CO remained low. NE added and Impella 5.5 placed 12/23. EF 10% on TEE 12/23. s/p HM III VAD on 12/28.  Echo 01/03 with EF 20-25%, RV moderately reduced, septum mid-line.  Co-ox 67% on 0.125 milrinone + 6 Epi. Midodrine 10 tid NE off. VAD interrogated personally. Parameters stable. Remains markedly fluid overloaded with weight up 25 pounds from pre-op - Main issue now is volume removal in setting of AKI/ESRD - Continue epi/milrinone/midodrine to keep MAP ideally >70 to promote renal perfusion/recovery.  - Pull -100/hr. Adjust pressors to facilitate volume removal. Can add VP as needed. May be able to bump VAD speed a bit - Warfarin for INR 2-2.5.  INR 2.4 today  - LDH 281 - Continue 81 mg aspirin - Continue UNNA boots 2. Tricuspid regurgitation: Has moderate-severe TR.  Tricuspid repair not done at time of VAD d/t proximity of ICD wires 3. Atrial fibrillation: Persistent since 10/22 based on device interrogation.  Was in junctional rhythm, remains in AF with HR 80-90s.   - Continue amio at 30/hr. - INR therapeutic 4. AKI on CKD stage 3: Creatinine 2.61 => 3.1 => 3.33 => 4.27 => 5.24.  Gradual rise (was about 1.7 pre-op) since surgery.  He has not been hypotensive post-op, suspect intra-op hypotension led to development of ATN => urine sediment looked like ATN per renal. Now on CVVH. TDC placed 1/7  - Maintain MAP and CO as above - Pulling for -100/hr UF.  - Nephrology following.   5. CAD: History of PCI to OM1 in 2007 and RCA in 2013.  No CP or ACs.  - Deferred cardiac cath due to  AKI and plan for VAD - Continue Crestor.  6. ID: Had fever 12/21, PCT 1.03.  Blood cultures negative. ?Phlebitis at IV site. Post-op fever with elevated WBCs, PCT 2.63 -> 1.78 - Currently on cefepime. Vanc stopped.  Continue cefepime 7. Constipation: Continue aggressive bowel regimen. improved 8. Hyponatremia: Hypervolemic hyponatremia.  Controlled via CVVH.  9. Anemia: Hgb down to 7.7 -> 8.6 after 1u RBC on 1/6. Hgb back to 7.7 today. No overt bleeding - continue to follow. Transfuse hgb < 7.5 10. Confusion/delirium: Initially post-op and started on Precedex.  This has cleared, now up and has been walking halls 11. F/E/N: Advance diet. Ensure ordered. Encouraged PO intake. 12. Left pleural effusion: - s/p thoracentesis 1/7 13. Constipation. No real BM x 2 weeks - add sorbitol and reglan. Tap water enema as needed  I reviewed the LVAD parameters from today, and compared the results to the patient's prior recorded data.  No programming changes were made.  The LVAD is functioning  within specified parameters.  The patient performs LVAD self-test daily.  LVAD interrogation was negative for any significant power changes, alarms or PI events/speed drops.  LVAD equipment check completed and is in good working order.  Back-up equipment present.   LVAD education done on emergency procedures and precautions and reviewed exit site care.  CRITICAL CARE Performed by: Glori Bickers  Total critical care time: 35 minutes  Critical care time was exclusive of separately billable procedures and treating other patients.  Critical care was necessary to treat or prevent imminent or life-threatening deterioration.  Critical care was time spent personally by me on the following activities: development of treatment plan with patient and/or surrogate as well as nursing, discussions with consultants, evaluation of patient's response to treatment, examination of patient, obtaining history from patient or surrogate,  ordering and performing treatments and interventions, ordering and review of laboratory studies, ordering and review of radiographic studies, pulse oximetry and re-evaluation of patient's condition.  Glori Bickers MD 09/28/2021 12:52 PM

## 2021-09-29 DIAGNOSIS — Z95811 Presence of heart assist device: Secondary | ICD-10-CM | POA: Diagnosis not present

## 2021-09-29 DIAGNOSIS — R570 Cardiogenic shock: Secondary | ICD-10-CM | POA: Diagnosis not present

## 2021-09-29 LAB — RENAL FUNCTION PANEL
Albumin: 1.7 g/dL — ABNORMAL LOW (ref 3.5–5.0)
Albumin: 1.8 g/dL — ABNORMAL LOW (ref 3.5–5.0)
Anion gap: 7 (ref 5–15)
Anion gap: 8 (ref 5–15)
BUN: 15 mg/dL (ref 8–23)
BUN: 15 mg/dL (ref 8–23)
CO2: 27 mmol/L (ref 22–32)
CO2: 25 mmol/L (ref 22–32)
Calcium: 8 mg/dL — ABNORMAL LOW (ref 8.9–10.3)
Calcium: 8.1 mg/dL — ABNORMAL LOW (ref 8.9–10.3)
Chloride: 99 mmol/L (ref 98–111)
Chloride: 102 mmol/L (ref 98–111)
Creatinine, Ser: 1.6 mg/dL — ABNORMAL HIGH (ref 0.61–1.24)
Creatinine, Ser: 1.69 mg/dL — ABNORMAL HIGH (ref 0.61–1.24)
GFR, Estimated: 46 mL/min — ABNORMAL LOW (ref >60)
GFR, Estimated: 43 mL/min — ABNORMAL LOW (ref >60)
Glucose, Bld: 122 mg/dL — ABNORMAL HIGH (ref 70–99)
Glucose, Bld: 136 mg/dL — ABNORMAL HIGH (ref 70–99)
Phosphorus: 1.8 mg/dL — ABNORMAL LOW (ref 2.5–4.6)
Phosphorus: 1.5 mg/dL — ABNORMAL LOW (ref 2.5–4.6)
Potassium: 4.6 mmol/L (ref 3.5–5.1)
Potassium: 4.5 mmol/L (ref 3.5–5.1)
Sodium: 133 mmol/L — ABNORMAL LOW (ref 135–145)
Sodium: 135 mmol/L (ref 135–145)

## 2021-09-29 LAB — MAGNESIUM: Magnesium: 2.4 mg/dL (ref 1.7–2.4)

## 2021-09-29 LAB — COOXEMETRY PANEL
Carboxyhemoglobin: 2.1 % — ABNORMAL HIGH (ref 0.5–1.5)
Methemoglobin: 1 % (ref 0.0–1.5)
O2 Saturation: 69.6 %
Total hemoglobin: 7.2 g/dL — ABNORMAL LOW (ref 12.0–16.0)

## 2021-09-29 LAB — CBC
HCT: 23 % — ABNORMAL LOW (ref 39.0–52.0)
Hemoglobin: 7.8 g/dL — ABNORMAL LOW (ref 13.0–17.0)
MCH: 29.1 pg (ref 26.0–34.0)
MCHC: 33.9 g/dL (ref 30.0–36.0)
MCV: 85.8 fL (ref 80.0–100.0)
Platelets: 445 10*3/uL — ABNORMAL HIGH (ref 150–400)
RBC: 2.68 MIL/uL — ABNORMAL LOW (ref 4.22–5.81)
RDW: 19.7 % — ABNORMAL HIGH (ref 11.5–15.5)
WBC: 14.3 10*3/uL — ABNORMAL HIGH (ref 4.0–10.5)
nRBC: 0 % (ref 0.0–0.2)

## 2021-09-29 LAB — GLUCOSE, CAPILLARY
Glucose-Capillary: 101 mg/dL — ABNORMAL HIGH (ref 70–99)
Glucose-Capillary: 106 mg/dL — ABNORMAL HIGH (ref 70–99)
Glucose-Capillary: 107 mg/dL — ABNORMAL HIGH (ref 70–99)
Glucose-Capillary: 112 mg/dL — ABNORMAL HIGH (ref 70–99)
Glucose-Capillary: 121 mg/dL — ABNORMAL HIGH (ref 70–99)
Glucose-Capillary: 83 mg/dL (ref 70–99)
Glucose-Capillary: 87 mg/dL (ref 70–99)
Glucose-Capillary: 89 mg/dL (ref 70–99)

## 2021-09-29 LAB — PROTIME-INR
INR: 2.4 — ABNORMAL HIGH (ref 0.8–1.2)
Prothrombin Time: 26.3 seconds — ABNORMAL HIGH (ref 11.4–15.2)

## 2021-09-29 LAB — LACTATE DEHYDROGENASE: LDH: 291 U/L — ABNORMAL HIGH (ref 98–192)

## 2021-09-29 MED ORDER — POLYETHYLENE GLYCOL 3350 17 G PO PACK
17.0000 g | PACK | Freq: Two times a day (BID) | ORAL | Status: DC
  Administered 2021-09-29 – 2021-09-30 (×3): 17 g via ORAL
  Filled 2021-09-29 (×3): qty 1

## 2021-09-29 MED ORDER — SODIUM PHOSPHATES 45 MMOLE/15ML IV SOLN
30.0000 mmol | Freq: Once | INTRAVENOUS | Status: AC
  Administered 2021-09-29: 30 mmol via INTRAVENOUS
  Filled 2021-09-29 (×2): qty 10

## 2021-09-29 NOTE — TOC Progression Note (Addendum)
Transition of Care [facility]) - Progression Note    Patient Details  Name: Isaac [name] MRN: [id number] Date of Birth: Aug 03, 1951  [facility] CM/SW Contact  Ardeth Repetto, LCSW Phone Number: 09/29/2021, 2:30 PM  Clinical Narrative:    HF CSW attempted to speak with Isaac [name] at bedside about the consult for SNF, however Isaac [name] didn't want to talk at the moment as he had family or friends visiting. CSW will come back at a later time to discuss the plan for discharge.   Barriers to discharge: If SNF is the plan at time of discharge with his LVAD this will be a barrier finding a SNF that will be comfortable with Isaac [name] LVAD. If rehab is needed hopefully CIR vs SNF. Awaiting progress and plan from treatment team/VAD coordinators.  CSW will continue to follow throughout discharge.   Expected Discharge Plan: [place] Barriers to Discharge: Continued Medical Work up, Other (must enter comment) (LVAD SNF placement may be difficult)  Expected Discharge Plan and Services Expected Discharge Plan: [place] In-house Referral: Clinical Social Work Discharge Planning Services: CM Consult Post Acute Care Choice: [place] arrangements for the past 2 months: Single Family Home                                       Social Determinants of Health (SDOH) Interventions    Readmission Risk Interventions No flowsheet data found.  Deasiah Hagberg, MSW, [place] [facility] Failure Social Worker

## 2021-09-29 NOTE — Progress Notes (Addendum)
Patient ID: Isaac [name], male   DOB: 1950-10-18, 71 y.o.   MRN: [id number]    Advanced Heart Failure VAD Team Note  PCP-Cardiologist: None   Subjective:    12/19 RHC- RA 7, PA 39/14 (25), PCWP 11, CO 6.4 CI 3. Thermo 3.5 1.8. Lasix drip stopped after cath. 12/20 Swan removed.  Norepi 3 mcg added.  12/21 Fever. Blood CX drawn. UA + leukocytes. Started on vanc and cefepime. Diuresed with IV lasix + metolazone.  12/23 Impella 5.5 placed 12/28 HM III LVAD, ICD leads plastered to tricuspid valve with severe TR, valve not replaced and some improvement in TR with LVAD placement.  12/29 Extubated 01/03 Started CRRT 01/07  L thoracentesis and placement of TDC  Remains on milrinone 0.125, epi 5 . Co-ox 70%  CVP 15   Remains on CVVHD. Pulling 100/hr. Urine output 10cc  On cefepime. Vanc off  Feels nauseated this am after eating breakfast. No vomiting. Had BM yesterday.  No other complaints. Denies dyspnea.    LVAD INTERROGATION:  HeartMate III LVAD:   Flow 4.9 liters/min, speed 5550, power 4.0, PI 2.4. 4 PI events.  VAD interrogated personally. Parameters stable.     Objective:    Vital Signs:   Temp:  [98.4 F (36.9 C)-99.7 F (37.6 C)] 99 F (37.2 C) (01/09 0700) Pulse Rate:  [29-180] 105 (01/09 0600) Resp:  [9-27] 21 (01/09 0700) BP: (75-132)/(58-96) 132/96 (01/09 0500) SpO2:  [91 %-100 %] 95 % (01/09 0600) Arterial Line BP: (97-119)/(56-89) 98/61 (01/09 0700) Weight:  [85.6 kg] 85.6 kg (01/09 0300) Last BM Date: 09/25/21 Mean arterial Pressure  80s   Intake/Output:   Intake/Output Summary (Last 24 hours) at 09/29/2021 0732 Last data filed at 09/29/2021 0700 Gross per 24 hour  Intake 862.01 ml  Output 2402 ml  Net -1539.99 ml     Physical Exam   CVP 15  General:  Well appearing. No distress.  HEENT: normal  Neck: supple. JVD 14 cm  Carotids 2+ bilat; no bruits. No lymphadenopathy or thryomegaly appreciated. [facility] cath left upper chest  Cor: LVAD hum.   Lungs: CTAB> no wheezing  Abdomen: obese soft, nontender, + distended.Hypoactive bowel sounds. Driveline site clean. Anchor in place.  Extremities: no cyanosis, clubbing, rash. 1+ b/l LE  Neuro: alert & oriented x 3. No focal deficits. Moves all 4 without problem   Telemetry   AF 80-90s (personally reviewed)   Labs   Basic Metabolic Panel: Recent Labs  Lab 09/25/21 0336 09/25/21 0340 09/26/21 0250 09/26/21 1628 09/27/21 0348 09/27/21 1600 09/28/21 0341 09/28/21 1951 09/29/21 0311  NA 132*   < > 133*   < > 135 136 132* 133* 133*  K 4.7   < > 4.5   < > 4.5 4.5 4.5 4.5 4.6  CL 101   < > 98   < > 102 104 99 102 99  CO2 25   < > 26   < > 27 25 27 27 27   GLUCOSE 91   < > 124*   < > 170* 116* 124* 128* 122*  BUN 21   < > 18   < > 19 19 17 17 15   CREATININE 2.38*   < > 1.97*   < > 1.78* 1.68* 1.69* 1.73* 1.60*  CALCIUM 8.2*   < > 7.9*   < > 7.8* 8.0* 8.0* 7.8* 8.0*  MG 2.4  --  2.4  --  2.4  --  2.4  --  2.4  PHOS 2.0*   < > 2.8   < > 2.2* 1.9* 2.0* 1.6* 1.8*   < > = values in this interval not displayed.    Liver Function Tests: Recent Labs  Lab 09/24/21 0409 09/24/21 1631 09/27/21 0348 09/27/21 1600 09/28/21 0341 09/28/21 1951 09/29/21 0311  AST 51*  --  35  --   --   --   --   ALT <5  --  6  --   --   --   --   ALKPHOS 154*  --  170*  --   --   --   --   BILITOT 3.2*  --  1.7*  --   --   --   --   PROT 5.8*  --  5.7*  --   --   --   --   ALBUMIN 2.0*   1.9*   < > 1.8*   1.8* 1.8* 1.7* 1.8* 1.7*   < > = values in this interval not displayed.   No results for input(s): LIPASE, AMYLASE in the last 168 hours. No results for input(s): AMMONIA in the last 168 hours.   CBC: Recent Labs  Lab 09/23/21 0350 09/23/21 0355 09/24/21 0409 09/24/21 0411 09/26/21 0250 09/27/21 0348 09/28/21 0341 09/28/21 1951 09/29/21 0311  WBC 13.1*  --  11.0*   < > 10.1 13.5* 14.0* 14.2* 14.3*  NEUTROABS 10.7*  --  9.0*  --   --   --   --  11.5*  --   HGB 8.1*   < > 8.2*   < >  7.7* 8.6* 7.7* 7.7* 7.8*  HCT 23.0*   < > 23.0*   < > 22.3* 24.9* 23.5* 23.7* 23.0*  MCV 83.6  --  82.4   < > 84.2 84.4 84.8 86.5 85.8  PLT 345  --  408*   < > 465* 496* 449* 475* 445*   < > = values in this interval not displayed.    INR: Recent Labs  Lab 09/25/21 0336 09/26/21 0250 09/27/21 0348 09/28/21 0341 09/29/21 0311  INR 2.1* 2.0* 2.2* 2.4* 2.4*    Other results:   Imaging   DG Chest 1 View  Result Date: 09/27/2021 CLINICAL DATA:  Right internal jugular permanent hemodialysis catheter placement. EXAM: CHEST  1 VIEW COMPARISON:  None. FINDINGS: The right-sided hemodialysis catheter is visualized with the distal tip near the caval atrial junction or just into the right atrium, in good position. The LVAD device and AICD device are in good position. A right PICC line is again identified. The distal tip is obscured by the other support apparatus. No pneumothorax. Stable cardiomegaly, small effusions, and pulmonary edema. No change in the cardiomediastinal silhouette. IMPRESSION: 1. The new right hemodialysis catheter is in good position without pneumothorax. Other support apparatus is in good position. 2. Mild pulmonary edema with probable small effusions. Electronically Signed   By: Dorise Bullion III M.D.   On: 09/27/2021 13:12   IR Fluoro Guide CV Line Right  Result Date: 09/27/2021 INDICATION: Heart failure, LVAD patient, access for dialysis EXAM: ULTRASOUND GUIDANCE FOR VASCULAR ACCESS RIGHT INTERNAL JUGULAR PERMANENT HEMODIALYSIS CATHETER Date:  09/27/2021 09/27/2021 11:23 am Radiologist:  M. Daryll Brod, MD Guidance:  Ultrasound and fluoroscopic FLUOROSCOPY TIME:  Fluoroscopy Time: 1 minutes 36 seconds (8 mGy). MEDICATIONS: 1% lidocaine local ANESTHESIA/SEDATION: Moderate Sedation Time:  None. The patient was continuously monitored during the procedure by the interventional radiology nurse under my direct supervision. CONTRAST:  None. COMPLICATIONS: None immediate. PROCEDURE:  Informed consent was obtained from the patient following explanation of the procedure, risks, benefits and alternatives. The patient understands, agrees and consents for the procedure. All questions were addressed. A time out was performed. Maximal barrier sterile technique utilized including caps, mask, sterile gowns, sterile gloves, large sterile drape, hand hygiene, and 2% chlorhexidine scrub. Under sterile conditions and local anesthesia, right internal jugular micropuncture venous access was performed with ultrasound. Images were obtained for documentation of the patent right internal jugular vein. A guide wire was inserted followed by a transitional dilator. Next, a 0.035 guidewire was advanced into the IVC with a 5-French catheter. Measurements were obtained from the right venotomy site to the proximal right atrium. In the right infraclavicular chest, a subcutaneous tunnel was created under sterile conditions and local anesthesia. 1% lidocaine with epinephrine was utilized for this. The 23 cm tip to cuff palindrome catheter was tunneled subcutaneously to the venotomy site and inserted into the SVC/RA junction through a valved peel-away sheath. Position was confirmed with fluoroscopy. Images were obtained for documentation. Blood was aspirated from the catheter followed by saline and heparin flushes. The appropriate volume and strength of heparin was instilled in each lumen. Caps were applied. The catheter was secured at the tunnel site with Gelfoam and a pursestring suture. The venotomy site was closed with subcuticular Vicryl suture. Dermabond was applied to the small right neck incision. A dry sterile dressing was applied. The catheter is ready for use. No immediate complications. IMPRESSION: Ultrasound and fluoroscopically guided right internal jugular tunneled hemodialysis catheter (23 cm tip to cuff palindrome catheter). Electronically Signed   By: Jerilynn Mages.  Shick M.D.   On: 09/27/2021 11:45   IR US Guide  Vasc Access Right  Result Date: 09/27/2021 INDICATION: Heart failure, LVAD patient, access for dialysis EXAM: ULTRASOUND GUIDANCE FOR VASCULAR ACCESS RIGHT INTERNAL JUGULAR PERMANENT HEMODIALYSIS CATHETER Date:  09/27/2021 09/27/2021 11:23 am Radiologist:  M. Daryll Brod, MD Guidance:  Ultrasound and fluoroscopic FLUOROSCOPY TIME:  Fluoroscopy Time: 1 minutes 36 seconds (8 mGy). MEDICATIONS: 1% lidocaine local ANESTHESIA/SEDATION: Moderate Sedation Time:  None. The patient was continuously monitored during the procedure by the interventional radiology nurse under my direct supervision. CONTRAST:  None. COMPLICATIONS: None immediate. PROCEDURE: Informed consent was obtained from the patient following explanation of the procedure, risks, benefits and alternatives. The patient understands, agrees and consents for the procedure. All questions were addressed. A time out was performed. Maximal barrier sterile technique utilized including caps, mask, sterile gowns, sterile gloves, large sterile drape, hand hygiene, and 2% chlorhexidine scrub. Under sterile conditions and local anesthesia, right internal jugular micropuncture venous access was performed with ultrasound. Images were obtained for documentation of the patent right internal jugular vein. A guide wire was inserted followed by a transitional dilator. Next, a 0.035 guidewire was advanced into the IVC with a 5-French catheter. Measurements were obtained from the right venotomy site to the proximal right atrium. In the right infraclavicular chest, a subcutaneous tunnel was created under sterile conditions and local anesthesia. 1% lidocaine with epinephrine was utilized for this. The 23 cm tip to cuff palindrome catheter was tunneled subcutaneously to the venotomy site and inserted into the SVC/RA junction through a valved peel-away sheath. Position was confirmed with fluoroscopy. Images were obtained for documentation. Blood was aspirated from the catheter followed by  saline and heparin flushes. The appropriate volume and strength of heparin was instilled in each lumen. Caps were applied. The catheter was secured at the tunnel  site with Gelfoam and a pursestring suture. The venotomy site was closed with subcuticular Vicryl suture. Dermabond was applied to the small right neck incision. A dry sterile dressing was applied. The catheter is ready for use. No immediate complications. IMPRESSION: Ultrasound and fluoroscopically guided right internal jugular tunneled hemodialysis catheter (23 cm tip to cuff palindrome catheter). Electronically Signed   By: Jerilynn Mages.  Shick M.D.   On: 09/27/2021 11:45   IR THORACENTESIS ASP PLEURAL SPACE W/IMG GUIDE  Result Date: 09/27/2021 INDICATION: Heart failure, LVAD patient, large left effusion EXAM: ULTRASOUND GUIDED LEFT THORACENTESIS MEDICATIONS: 1% lidocaine local COMPLICATIONS: None immediate. PROCEDURE: An ultrasound guided thoracentesis was thoroughly discussed with the patient and questions answered. The benefits, risks, alternatives and complications were also discussed. The patient understands and wishes to proceed with the procedure. Written consent was obtained. Ultrasound was performed to localize and mark an adequate pocket of fluid in the left chest. The area was then prepped and draped in the normal sterile fashion. 1% Lidocaine was used for local anesthesia. Under ultrasound guidance a 6 Fr Safe-T-Centesis catheter was introduced. Thoracentesis was performed. The catheter was removed and a dressing applied. FINDINGS: A total of approximately 1 L of amber colored pleural fluid was removed. Samples were sent to the laboratory as requested by the clinical team. IMPRESSION: Successful ultrasound guided left thoracentesis yielding 1 L of pleural fluid. Electronically Signed   By: Jerilynn Mages.  Shick M.D.   On: 09/27/2021 11:43     Medications:     Scheduled Medications:  (feeding supplement) PROSource Plus  30 mL Oral TID WC   sodium chloride    Intravenous Once   sodium chloride   Intravenous Once   acetaminophen  500 mg Oral Q6H   aspirin  81 mg Oral Daily   bisacodyl  10 mg Oral Daily   Or   bisacodyl  10 mg Rectal Daily   chlorhexidine gluconate (MEDLINE KIT)  15 mL Mouth Rinse BID   Chlorhexidine Gluconate Cloth  6 each Topical Daily   docusate sodium  200 mg Oral Daily   feeding supplement  237 mL Oral TID BM   mouth rinse  15 mL Mouth Rinse BID   metoCLOPramide (REGLAN) injection  5 mg Intravenous Q8H   midodrine  10 mg Oral TID WC   montelukast  5 mg Oral QHS   multivitamin  1 tablet Oral QHS   pantoprazole  40 mg Oral Daily   polyethylene glycol  17 g Oral Daily   rosuvastatin  10 mg Oral Daily   sodium chloride flush  10-40 mL Intracatheter Q12H   sodium chloride flush  3 mL Intravenous Q12H   warfarin  1 mg Oral q1600   Warfarin - Pharmacist Dosing Inpatient   Does not apply q1600    Infusions:   prismasol BGK 4/2.5 400 mL/hr at 09/29/21 0527    prismasol BGK 4/2.5 200 mL/hr at 09/29/21 0527   sodium chloride Stopped (09/20/21 0304)   sodium chloride Stopped (09/18/21 0803)   amiodarone 30 mg/hr (09/29/21 0700)   ceFEPime (MAXIPIME) IV Stopped (09/28/21 2153)   epinephrine 5 mcg/min (09/29/21 0700)   lactated ringers     milrinone 0.125 mcg/kg/min (09/29/21 0700)   norepinephrine (LEVOPHED) Adult infusion Stopped (09/28/21 2218)   prismasol BGK 4/2.5 1,800 mL/hr at 09/29/21 0527   vasopressin      PRN Medications: sodium chloride, albuterol, heparin, heparin, lip balm, melatonin, morphine injection, ondansetron (ZOFRAN) IV, oxyCODONE, polyethylene glycol, sodium chloride flush, sodium chloride  flush, sorbitol, traMADol   Assessment/Plan:    1. Acute on chronic systolic CHF:  Long-standing cardiomyopathy.  [place].  Echo this admission with EF < 20%, severe LV dilation, restrictive diastolic function, moderate RV dysfunction, moderate MR, mod-severe TR. Cause of cardiomyopathy is  uncertain.  He has a history of CAD, but I do not think that the described CAD from the past could explain his cardiomyopathy, but CAD could have progressed.  With difficulty tolerating GDMT/need for midodrine and cardiorenal syndrome as well as profound volume overload + NYHA class IV symptoms,  concerned for low output HF. Co-ox off milrinone was 36%, milrinone started and increased to 0.375 but CO remained low. NE added and Impella 5.5 placed 12/23. EF 10% on TEE 12/23. s/p HM III VAD on 12/28.  Echo 01/03 with EF 20-25%, RV moderately reduced, septum mid-line.  Co-ox 70% on 0.125 milrinone + 5 Epi. Midodrine 10 tid NE off. VAD interrogated personally. Parameters stable. Remains markedly fluid overloaded with weight up 25 pounds from pre-op - Main issue now is volume removal in setting of AKI/ESRD - Continue epi/milrinone/midodrine to keep MAP ideally >70 to promote renal perfusion/recovery.  - Pull -100/hr. Adjust pressors to facilitate volume removal. Can add VP as needed. May be able to bump VAD speed a bit - Warfarin for INR 2-2.5.  INR 2.4 today  - LDH 291 - Continue 81 mg aspirin - Continue UNNA boots 2. Tricuspid regurgitation: Has moderate-severe TR.  Tricuspid repair not done at time of VAD d/t proximity of ICD wires 3. Atrial fibrillation: Persistent since 10/22 based on device interrogation.  Was in junctional rhythm, remains in AF with HR 80-90s.   - Continue amio at 30/hr. - INR therapeutic 4. AKI on CKD stage 3: Creatinine 2.61 => 3.1 => 3.33 => 4.27 => 5.24=>1.60 today.  Gradual rise (was about 1.7 pre-op) since surgery.  He has not been hypotensive post-op, suspect intra-op hypotension led to development of ATN => urine sediment looked like ATN per renal. Now on CVVH. TDC placed 1/7  - Maintain MAP and CO as above - Pulling for -100/hr UF.  - Nephrology following.   5. CAD: History of PCI to OM1 in 2007 and RCA in 2013.  No CP or ACs.  - Deferred cardiac cath due to AKI and plan  for VAD - Continue Crestor.  6. ID: Had fever 12/21, PCT 1.03.  Blood cultures negative. ?Phlebitis at IV site. Post-op fever with elevated WBCs, PCT 2.63 -> 1.78 - Currently on cefepime. Vanc stopped.  Continue cefepime 7. Constipation: Continue aggressive bowel regimen. improved 8. Hyponatremia: Hypervolemic hyponatremia.  Controlled via CVVH.  9. Anemia: Hgb down to 7.7 -> 8.6 after 1u RBC on 1/6. Hgb back to 7.8 today. No overt bleeding - continue to follow. Transfuse hgb < 7.5 10. Confusion/delirium: Initially post-op and started on Precedex.  This has cleared, now up and has been walking halls 11. F/E/N: Advance diet. Ensure ordered. Encouraged PO intake. 12. Left pleural effusion: - s/p thoracentesis 1/7 13. Constipation. Resolved. BM 1/8   I reviewed the LVAD parameters from today, and compared the results to the patient's prior recorded data.  No programming changes were made.  The LVAD is functioning within specified parameters.  The patient performs LVAD self-test daily.  LVAD interrogation was negative for any significant power changes, alarms or PI events/speed drops.  LVAD equipment check completed and is in good working order.  Back-up equipment present.   LVAD education  done on emergency procedures and precautions and reviewed exit site care.  Lyda Jester PA-C  09/29/2021 7:32 AM  Patient seen with PA, agree with the above note.   This morning, CVP 15 with co-ox 70%.  Currently getting CVVH, pulling net negative 100 cc/hr.  Weight down yesterday. Hgb stable 7.8.  Tm 99.  He remains on cefepime.   Some nausea this morning but ready to get up and walk.   General: Well appearing this am. NAD.  HEENT: Normal. Neck: Supple, JVP 14 cm cm. Carotids OK.  Cardiac:  Mechanical heart sounds with LVAD hum present.  Lungs:  CTAB, normal effort.  Abdomen:  NT, ND, no HSM. No bruits or masses. +BS  LVAD exit site: Well-healed and incorporated. Dressing dry and intact. No  erythema or drainage. Stabilization device present and accurately applied. Driveline dressing changed daily per sterile technique. Extremities:  Warm and dry. No cyanosis, clubbing, rash, or edema.  Neuro:  Alert & oriented x 3. Cranial nerves grossly intact. Moves all 4 extremities w/o difficulty. Affect pleasant    Continue CVVH, pull net negative 100 cc/hr.   He remains on milrinone 0.125, epinephrine 5, midodrine 10 tid. MAP upper 70s-80s.  - Increased LVAD speed to 5600 rpm today with flow to 5 and stable PI.  - Continue milrinone 0.125.  - Wean epinephrine today.   Continue cefepime for total of 5 days, WBCs trending down.   Needs to walk in halls today.   CRITICAL CARE Performed by: Loralie Champagne  Total critical care time: 40 minutes  Critical care time was exclusive of separately billable procedures and treating other patients.  Critical care was necessary to treat or prevent imminent or life-threatening deterioration.  Critical care was time spent personally by me on the following activities: development of treatment plan with patient and/or surrogate as well as nursing, discussions with consultants, evaluation of patient's response to treatment, examination of patient, obtaining history from patient or surrogate, ordering and performing treatments and interventions, ordering and review of laboratory studies, ordering and review of radiographic studies, pulse oximetry and re-evaluation of patient's condition.  Loralie Champagne 09/29/2021 7:59 AM

## 2021-09-29 NOTE — Progress Notes (Signed)
[facility] for Warfarin  Indication: LVAD/AF  Allergies  Allergen Reactions   Mushroom Extract Complex Nausea And Vomiting   Neosporin [Neomycin-Bacitracin Zn-Polymyx] Hives    Patient Measurements: Height: 5\' 9"  (175.3 cm) Weight: 85.6 kg (188 lb 11.4 oz) IBW/kg (Calculated) : 70.7 Heparin Dosing Weight: 83.7 kg  Vital Signs: Temp: 99.9 F (37.7 C) (01/09 1200) Temp Source: Bladder (01/09 1200) BP: 132/96 (01/09 0500) Pulse Rate: 155 (01/09 1000)  Labs: Recent Labs    09/27/21 0348 09/27/21 1600 09/28/21 0341 09/28/21 1951 09/29/21 0311  HGB 8.6*  --  7.7* 7.7* 7.8*  HCT 24.9*  --  23.5* 23.7* 23.0*  PLT 496*  --  449* 475* 445*  LABPROT 24.4*  --  25.7*  --  26.3*  INR 2.2*  --  2.4*  --  2.4*  CREATININE 1.78*   < > 1.69* 1.73* 1.60*   < > = values in this interval not displayed.     Estimated Creatinine Clearance: 47.3 mL/min (A) (by C-G formula based on SCr of 1.6 mg/dL (H)).   Medical History: Past Medical History:  Diagnosis Date   Arrhythmia    atrial fibrillation   CHF (congestive heart failure) (HCC)    Chronic kidney disease    Coronary artery disease    Hyperlipidemia    Hypertension    Myocardial infarct [facility])     Assessment: 71 yo male on chronic Eliquis PTA for afib.  Now s/p LVAD placement 09/17/21, pharmacy asked to begin anticoagulation with warfarin.   INR is therapeutic at 2.4, EPW out. LDH stable and hb low stable - remain stable no bleeding noted  Goal of Therapy:  INR 2-2.5 Monitor platelets by anticoagulation protocol: Yes   Plan:  -Warfarin 1mg  PO daily  -Daily INR, CBC, LDH    Bonnita Nasuti Pharm.D. CPP, BCPS Clinical Pharmacist [phone] 09/29/2021 2:49 PM   Please check AMION for all [place] numbers 09/29/2021

## 2021-09-29 NOTE — Progress Notes (Signed)
Physical Therapy Treatment Patient Details Name: Isaac [name] MRN: [id number] DOB: 1950-12-24 Today's Date: 09/29/2021   History of Present Illness Pt is a 71 y.o. male admitted 09/07/21 with near-syncope, nausea, poor oral intake, generalized weakness. Workup for advanced heart failure and persistent inotropic with requirement, cardiogenic shock;. [place] 12/19. Impella placed on 12/23. LVAD placed 12/28. Pt with post-op delirium and agitation. CRRT initiated 1/3. PMH includes advanced CHF (LVEF <20%), AICD/PPM, PAF on Eliquis, CKD 3, HTN, HLD.    PT Comments    Pt making steady progress. Pt and wife report he hasn't been OOB over the weekend. Updated DC recommendation to SNF at this time. Feel he may need more time to improve activity tolerance enough to go home. Depending on his length of stay this may need to be updated again.    Recommendations for follow up therapy are one component of a multi-disciplinary discharge planning process, led by the attending physician.  Recommendations may be updated based on patient status, additional functional criteria and insurance authorization.  Follow Up Recommendations  Skilled nursing-short term rehab (<3 hours/day)     Assistance Recommended at Discharge Frequent or constant Supervision/Assistance  Patient can return home with the following A little help with walking and/or transfers   Equipment Recommendations  Rollator (4 wheels);BSC/3in1    Recommendations for Other Services       Precautions / Restrictions Precautions Precautions: Fall;Sternal;Other (comment) Precaution Comments: LVAD, CRRT Restrictions Weight Bearing Restrictions: Yes (sternal precautions)     Mobility  Bed Mobility Overal bed mobility: Needs Assistance Bed Mobility: Rolling;Sidelying to Sit Rolling: Min guard Sidelying to sit: Min assist       General bed mobility comments: Verbal cues for technique. Assist for safety and lines to roll and assist to  elevate trunk into sitting. Verbal cues to scoot forward to EOB    Transfers Overall transfer level: Needs assistance Equipment used:  H[place]on [place] walker) Transfers: Sit to/from Stand Sit to Stand: Min assist;From elevated surface;+2 safety/equipment           General transfer comment: Assist to steady to rise. Pt with hand to knees for sternal precautions    Ambulation/Gait Ambulation/Gait assistance: Min assist;+2 safety/equipment Gait Distance (Feet): 320 Feet Assistive device: Ethelene Hal Gait Pattern/deviations: Step-through pattern;Decreased stride length;Trunk flexed Gait velocity: decr Gait velocity interpretation: 1.31 - 2.62 ft/sec, indicative of limited community ambulator   General Gait Details: Assist for balance and lines   Stairs             Wheelchair Mobility    Modified Rankin (Stroke Patients Only)       Balance Overall balance assessment: Needs assistance Sitting-balance support: No upper extremity supported;Feet supported Sitting balance-Leahy Scale: Fair     Standing balance support: No upper extremity supported Standing balance-Leahy Scale: Fair Standing balance comment: static standing without UE support                            Cognition Arousal/Alertness: Awake/alert Behavior During Therapy: WFL for tasks assessed/performed;Flat affect Overall Cognitive Status: Impaired/Different from baseline Area of Impairment: Problem solving                             Problem Solving: Requires verbal cues;Slow processing          Exercises      General Comments General comments (skin integrity, edema, etc.): VSS on 4L. Pt with  dyspnea 3/4      Pertinent Vitals/Pain Pain Assessment: Faces Faces Pain Scale: Hurts a little bit Pain Location: generalized Pain Descriptors / Indicators: Grimacing Pain Intervention(s): Limited activity within patient's tolerance    Home Living                           Prior Function            PT Goals (current goals can now be found in the care plan section) Acute Rehab PT Goals Patient Stated Goal: home Progress towards PT goals: Progressing toward goals    Frequency    Min 3X/week      PT Plan Discharge plan needs to be updated    Co-evaluation PT/OT/SLP Co-Evaluation/Treatment: Yes Reason for Co-Treatment: Complexity of the patient's impairments (multi-system involvement);For patient/therapist safety PT goals addressed during session: Mobility/safety with mobility        AM-PAC PT "6 Clicks" Mobility   Outcome Measure  Help needed turning from your back to your side while in a flat bed without using bedrails?: A Little Help needed moving from lying on your back to sitting on the side of a flat bed without using bedrails?: A Little Help needed moving to and from a bed to a chair (including a wheelchair)?: A Little Help needed standing up from a chair using your arms (e.g., wheelchair or bedside chair)?: A Little Help needed to walk in hospital room?: A Little Help needed climbing 3-5 steps with a railing? : Total 6 Click Score: 16    End of Session Equipment Utilized During Treatment: Oxygen Activity Tolerance: Patient tolerated treatment well Patient left: in chair;with call bell/phone within reach;with nursing/sitter in room;with family/visitor present Nurse Communication: Mobility status PT Visit Diagnosis: Unsteadiness on feet (R26.81);Other abnormalities of gait and mobility (R26.89);Muscle weakness (generalized) (M62.81)     Time: 8264-1583 PT Time Calculation (min) (ACUTE ONLY): 55 min  Charges:  $Gait Training: 23-37 mins                     [place] Pager [phone] Office [place] 09/29/2021, 11:59 AM

## 2021-09-29 NOTE — Progress Notes (Signed)
Orthopedic Tech Progress Note Patient Details:  Isaac [name] 04/23/1951 [id number]  Patient ID: [name], male   DOB: [birth year], 71 y.o.   MRN: [id number] Reached out to RN via secure chat and requested that ortho tech be called once pt's legs have been cleaned and old unna boots removed, so we can place new ones.  Vernona Rieger 09/29/2021, 3:26 PM

## 2021-09-29 NOTE — Progress Notes (Signed)
Occupational Therapy Treatment Patient Details Name: Isaac [name] MRN: [id number] DOB: 07-Aug-1951 Today's Date: 09/29/2021   History of present illness 71 y.o. male admitted 09/07/21 with near-syncope, nausea, poor oral intake, generalized weakness. Workup for advanced heart failure and persistent inotropic with requirement, cardiogenic shock;. [place] 12/19. Impella placed on 12/23. LVAD placed 12/28. Pt with post-op delirium and agitation. CRRT initiated 1/3. PMH includes advanced CHF (LVEF <20%), AICD/PPM, PAF on Eliquis, CKD 3, HTN, HLD.   OT comments  Pt progressing towards established OT goals and is very motivated to participate in therapy. Pt continue to present with decreased FM strength, balance, and activity tolerance. Providing education on LVAD management including switching to/from battery power. Pt requiring Min A due to decreased FM strength and problem solving. Update dc to SNF and will continue to follow acutely as admitted.    Recommendations for follow up therapy are one component of a multi-disciplinary discharge planning process, led by the attending physician.  Recommendations may be updated based on patient status, additional functional criteria and insurance authorization.    Follow Up Recommendations  Skilled nursing-short term rehab (<3 hours/day)    Assistance Recommended at Discharge Frequent or constant Supervision/Assistance  Patient can return home with the following      Equipment Recommendations  BSC/3in1    Recommendations for Other Services      Precautions / Restrictions Precautions Precautions: Fall;Sternal;Other (comment) Precaution Comments: LVAD, CRRT       Mobility Bed Mobility Overal bed mobility: Needs Assistance Bed Mobility: Rolling;Sidelying to Sit Rolling: Min guard Sidelying to sit: Min assist       General bed mobility comments: Verbal cues for technique. Assist for safety and lines to roll and assist to elevate trunk into  sitting. Verbal cues to scoot forward to EOB    Transfers Overall transfer level: Needs assistance Equipment used:  H[place]on [place] walker) Transfers: Sit to/from Stand Sit to Stand: Min assist;From elevated surface;+2 safety/equipment           General transfer comment: Assist to steady to rise. Pt with hand to knees for sternal precautions     Balance Overall balance assessment: Needs assistance Sitting-balance support: No upper extremity supported;Feet supported Sitting balance-Leahy Scale: Fair     Standing balance support: No upper extremity supported Standing balance-Leahy Scale: Fair Standing balance comment: static standing without UE support                           ADL either performed or assessed with clinical judgement   ADL Overall ADL's : Needs assistance/impaired                         Toilet Transfer: Minimal assistance;Stand-pivot;BSC/3in1;+2 for safety/equipment Toilet Transfer Details (indicate cue type and reason): using eva walker         Functional mobility during ADLs: Minimal assistance;+2 for physical assistance;+2 for safety/equipment (eva walker) General ADL Comments: Providing education on switching from wall power to batteries. Pt performing with Min A due to decreased FM strength and problem solving. Pt also perform functional mobiltiy in hallway with Min A +2, eva walker, and mutiple standing rest breaks    Extremity/Trunk Assessment Upper Extremity Assessment Upper Extremity Assessment: Generalized weakness   Lower Extremity Assessment Lower Extremity Assessment: Defer to PT evaluation        Vision   Vision Assessment?: No apparent visual deficits   Perception     Praxis  Cognition Arousal/Alertness: Awake/alert Behavior During Therapy: WFL for tasks assessed/performed;Flat affect Overall Cognitive Status: Impaired/Different from baseline Area of Impairment: Problem solving                              Problem Solving: Requires verbal cues;Slow processing General Comments: Increase time and processing. very motivated          Exercises     Shoulder Instructions       General Comments VSS on 4L. Pt with dyspnea 3/4    Pertinent Vitals/ Pain       Pain Assessment: Faces Faces Pain Scale: Hurts a little bit Pain Location: generalized Pain Descriptors / Indicators: Grimacing Pain Intervention(s): Monitored during session;Limited activity within patient's tolerance;Repositioned  Home Living                                          Prior Functioning/Environment              Frequency  Min 2X/week        Progress Toward Goals  OT Goals(current goals can now be found in the care plan section)  Progress towards OT goals: Progressing toward goals  Acute Rehab OT Goals OT Goal Formulation: With patient Time For Goal Achievement: 10/06/21 Potential to Achieve Goals: Good ADL Goals Pt Will Perform Lower Body Bathing: with modified independence;sit to/from stand Pt Will Perform Lower Body Dressing: with modified independence;sit to/from stand Pt Will Transfer to Toilet: with modified independence;ambulating Additional ADL Goal #1: Pt will tolerate at least 8 minutes of OOB functional task without reast break to incrased activity tolerance Additional ADL Goal #2: pt will indep verbalize at least 2 energy conservation strategies to apply at d/c. Additional ADL Goal #3: Pt will manage LVAD equipment independently.  Plan Frequency remains appropriate;Discharge plan needs to be updated    Co-evaluation    PT/OT/SLP Co-Evaluation/Treatment: Yes Reason for Co-Treatment: For patient/therapist safety;To address functional/ADL transfers   OT goals addressed during session: ADL's and self-care      AM-PAC OT "6 Clicks" Daily Activity     Outcome Measure   Help from another person eating meals?: None Help from another person taking care of  personal grooming?: A Little Help from another person toileting, which includes using toliet, bedpan, or urinal?: A Lot Help from another person bathing (including washing, rinsing, drying)?: A Lot Help from another person to put on and taking off regular upper body clothing?: A Lot Help from another person to put on and taking off regular lower body clothing?: Total 6 Click Score: 14    End of Session Equipment Utilized During Treatment: Oxygen (eva walker)  OT Visit Diagnosis: Unsteadiness on feet (R26.81);Muscle weakness (generalized) (M62.81);Other symptoms and signs involving cognitive function;Other (comment);Pain (decreased activity tolerance)   Activity Tolerance Patient tolerated treatment well   Patient Left in chair;with call bell/phone within reach;with family/visitor present   Nurse Communication Mobility status        Time: 1045-1140 OT Time Calculation (min): 55 min  Charges: OT General Charges $OT Visit: 1 Visit OT Treatments $Self Care/Home Management : 8-22 mins $Therapeutic Activity: 8-22 mins  Janara Klett MSOT, OTR/L Acute Rehab Pager: [phone] Office: [place] 09/29/2021, 4:44 PM

## 2021-09-29 NOTE — Progress Notes (Signed)
Brief Note:   Discussed supplementation of phosphorus with MD Augustin Coupe. Received verbal order for 30 mmol sodium phosphate x 1. Discussed with RN.  Kerman Passey MS, RDN, LDN, CNSC Registered Dietitian III Clinical Nutrition RD Pager and On-Call Pager Number Located in [place]

## 2021-09-29 NOTE — Progress Notes (Signed)
Outpatient Heart Failure CSW will continue to follow patient during inpatient stay and assist as needed.  Jorge Ny, LCSW Clinical Social Worker [facility] Desk#: [phone] Cell#: [phone]

## 2021-09-29 NOTE — Progress Notes (Addendum)
LVAD Coordinator Rounding Note:  Admitted 09/06/21 due to CHF exacerbation to Dr Claris Gladden service. He was started on Milrinone on admission. Febrile n 09/10/21- suspected to have UTI- antibiotics started. Impella 5.5 placed 09/12/21.   HM III LVAD implanted on 09/17/21 by Dr Cyndia Bent under destination therapy criteria. 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   Pt lying in bed with bair hugger on. Patient denies complaints.   Dr.  Hollie Salk (Nephrology) in room. Assessed patient, told him plan will be to continue to pull 100 cc/hr with CVVH. Patient has no questions.   Right tunneled dialysis catheter in place. Right groin with small dressing intact.   Left arm remains swollen, propped up on pillows. Extremity warm, pt denies pain or tingling. Aline intact.   Remains in afib on Amiodarone gtt.   Pt reports large stool yesterday.     Vital signs: Temp: 99 HR: 90 - 110 afib Doppler Pressure: 88 Radial Art Line: 111/68 (92) O2 Sat: 100% on 4L [place] Wt: 185.2>196.8>194>189.1>192.2>198.4>189.6>188.7 lbs    LVAD interrogation reveals:  Speed: 5600 Flow: 4.7 Power: 4.1w PI: 3.3 Hematocrit: 22  Alarms: none Events: 70 yesterday  Fixed speed: 5600 Low speed limit: 5300  Drive Line: Existing VAD dressing removed and site care performed using sterile technique. Pts wife observed dressing change today. Drive line exit site cleaned with Chlora prep applicators x 2, allowed to dry, and gauze dressing with silver strip applied. Exit site not incorporated, the velour is fully implanted at exit site. 1 suture in place. Scant amount of bloody drainage noted at site and on gauze. Slight redness, no tenderness, foul odor or rash noted. Drive line anchor secure x 2. Daily dressing changes per Nurse Davonna Belling or [place] coordinator. Next dressing change due: 09/28/21.    Labs:  LDH trend: 344>379>370>378>380>331>296>281>291  INR  trend: 1.6>1.8>2.8>2.9>2.1>2.0>2.2>2.4>2.4  WBC trend: 14.1>13.6>13.1>11>8.7>10.1>13.5>14>14.2>14.3  Cr trend: 2.11>2.39>5.24>3.45>2.38>1.97>1.87>1.68>1.73>1.60  Hgb trend: 6.5>7.5>8.1>8.8>7.7>8.6>7.7>7.8  Anticoagulation Plan: -INR Goal: 2.0 - 2.5 -ASA Dose: 81 mg daily   Blood Products:  Intra Op: 09/17/21  1 PLT  4 FFP   4 PRBC  Cellsaver- 803 cc Post Op:  - 09/17/21>>1 PRBC - 09/18/21>> 3 PRBCs - 09/19/21>> 1 PRBC -    09/26/21>> 1 PRBC  Device: - [employer] dual -Therapies: OFF  *Will need to be referred to device clinic at discharge*  Arrythmias: 3 episodes of VT that required defib while in OR 09/17/21. Currently on Amiodarone gtt. Remains in afib.   Respiratory: extubated 09/18/22  Nitric Oxide:  off 09/18/22  Renal:  09/23/21>>started CVVH  Infection:  09/10/21>> blood cultures >> No growth 5 days; final 09/12/21>> urine culture >> No growth; final  09/25/21>>blood cultures>>pending 09/27/21>>pleural effusion>>negative, final  Drips:  Amiodarone 30 mg/hr Epinephrine 4 mcg/min Milrinone 0.125 mcg/kg/min   Patient Education: Wife not at bedside. Pt has been changing power sources. Discussed drive line care including no twisting, pulling, and keeping DL secured in anchor on at all times.   Plan/Recommendations:  1. [place] VAD coordinator for drive line or equipment concerns 2. Daily drive line dressing change per Nurse Davonna Belling or [place] coordinator 3. Discussed protein intake to increase wound healing.   Zada Girt RN [place] Coordinator  Office: [phone]  24/7 Pager: [phone]

## 2021-09-29 NOTE — Progress Notes (Signed)
Nutrition Follow-up  DOCUMENTATION CODES:   Severe malnutrition in context of chronic illness  INTERVENTION:   Start Calorie Count to better assess nutritional intake. If po intake remains inadequate, recommend insertion of Cortrak with initiation of TF  Continue Ensure Enlive po TID, each supplement provides 350 kcal and 20 grams of protein  Continue 30 ml ProSource Plus TID, each supplement provides 100 kcals and 15 grams protein.   Continue liberalized diet  Continue Protein Smoothie made by wife  Continue Renal MVI  Cater to pt preferences; pt is a pescetarian, does eat eggs.   Recommend continued supplementation of phosphorus while on CRRT  NUTRITION DIAGNOSIS:   Severe Malnutrition related to chronic illness (heart failure) as evidenced by severe fat depletion, severe muscle depletion.  Being addressed via supplements, calorie count  GOAL:   Patient will meet greater than or equal to 90% of their needs  MONITOR:   PO intake, Supplement acceptance, Labs, Weight trends  REASON FOR ASSESSMENT:   Consult LVAD Eval  ASSESSMENT:   71 yo male admitted with acute on chronic CHF. PMH includes CHF, CAD, CKD, HLD, HTN  12/23 Impella 5.5 placed 12/28 HM3 LVAD plaecd, Impella removed, Intubated 12/29 Extubated 12/31 FL diet 01/02 Diet advanced to 2g sodium 01/03 CRRT initiated    Pt remains on CRRT. Sitting in chair on visit today, after walking with PT. Pt complaining of sore bottom, wanting to get back in the bed  Pt reports appetite is so-so with +nausea after eating breakfast. Recorded po intake 15-100%; nothing recorded since 1/07. Pt reports he is drinking some Ensure, take some Pro-Source but not much. Pt also reports drinking some of protein smoothie brought in by wife.   RD discussed with pt that if po intake does not improve, RD recommends insertion of Cortrak with initiation of supplemental TF.   Bowels moving better, +large BM today; noted pt started  on reglan on 1/08. Pt does report early satiety, abdominal discomfort improving.   Phosphorus low post initiation of CRRT  Labs: phosphorus 1.8 (L) Meds: reglan, miralax, rena-vite, sorbitol prn, zofran prn, colace, dulcolax   Diet Order:   Diet Order             Diet regular Room service appropriate? Yes; Fluid consistency: Thin  Diet effective now                   EDUCATION NEEDS:   Education needs have been addressed  Skin:  Skin Assessment: Reviewed RN Assessment  Last BM:  1/09 +large type 7 BM, +flatus  Height:   Ht Readings from Last 1 Encounters:  09/17/21 5\' 9"  (1.753 m)    Weight:   Wt Readings from Last 1 Encounters:  09/29/21 85.6 kg    BMI:  Body mass index is 27.87 kg/m.  Estimated Nutritional Needs:   Kcal:  2300-2500 kcals  Protein:  135-160 g  Fluid:  1.8 L  Kerman Passey MS, RDN, LDN, CNSC Registered Dietitian III Clinical Nutrition RD Pager and On-Call Pager Number Located in [place]

## 2021-09-29 NOTE — Progress Notes (Signed)
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 Acute component either from cardiorenal +/- ATN with decreased renal function from hemodynamics and CPB + hypotensive episodes + pressors.  - CRRT started on 1/3.        - Tolerating UF -> 50-100 ml/hr;  trying to avoid hypotensive episodes which will prolong recovery.        - CVP down from 16 to 12; overnight limited to UF 50-75 because of hypotension.        - RIJ TC currently to help with PT.   -Cont monitoring daily I/Os + weights  -Maintain MAP>65 for optimal renal perfusion.  -Avoid nephrotoxic medications including NSAIDs, contrast. - Dose medications for a GFR <15 ml/min and also on CRRT.   HFrEF with underlying CM s/p HM3  CASHD with h/o PCI last in 2013 - waiting on cath bec of renal compromise.  Anemia Afib  Subjective:    Seen in room.  No Issues with CRRT, getting fluid goal.     Objective:   BP (!) 132/96    Pulse 95    Temp 99 F (37.2 C) (Bladder)    Resp (!) 25    Ht _0  (1.753 m)    Wt 85.6 kg    SpO2 (!) 88%    BMI 27.87 kg/m   Intake/Output Summary (Last 24 hours) at 09/29/2021 0913 Last data filed at 09/29/2021 0800 Gross per 24 hour  Intake 837.43 ml  Output 2366 ml  Net -1528.57 ml   Weight change: -0.4 kg  Physical Exam: GEN: NAD, A&Ox3, HEENT: wearing glasses NECK: + JVD to angle of mandible LUNGS: CTA B/L no rales, rhonchi or wheezing CV: LVAD hum ABD: + bowel sounds EXT: 3+ lower extremity edema and also present in the UE, UNNA boots ACCESS: rt IJ TC   Imaging: DG Chest 1 View  Result Date: 09/27/2021 CLINICAL DATA:  Right internal jugular permanent hemodialysis catheter placement. EXAM: CHEST  1 VIEW COMPARISON:  None. FINDINGS: The right-sided hemodialysis catheter is visualized with the distal tip near the caval atrial junction or just into the right atrium, in good position. The LVAD device and AICD  device are in good position. A right PICC line is again identified. The distal tip is obscured by the other support apparatus. No pneumothorax. Stable cardiomegaly, small effusions, and pulmonary edema. No change in the cardiomediastinal silhouette. IMPRESSION: 1. The new right hemodialysis catheter is in good position without pneumothorax. Other support apparatus is in good position. 2. Mild pulmonary edema with probable small effusions. Electronically Signed   By: Dorise Bullion III M.D.   On: 09/27/2021 13:12   IR Fluoro Guide CV Line Right  Result Date: 09/27/2021 INDICATION: Heart failure, LVAD patient, access for dialysis EXAM: ULTRASOUND GUIDANCE FOR VASCULAR ACCESS RIGHT INTERNAL JUGULAR PERMANENT HEMODIALYSIS CATHETER Date:  09/27/2021 09/27/2021 11:23 am Radiologist:  M. Daryll Brod, MD Guidance:  Ultrasound and fluoroscopic FLUOROSCOPY TIME:  Fluoroscopy Time: 1 minutes 36 seconds (8 mGy). MEDICATIONS: 1% lidocaine local ANESTHESIA/SEDATION: Moderate Sedation Time:  None. The patient was continuously monitored during the procedure by the interventional radiology nurse under my direct supervision. CONTRAST:  None. COMPLICATIONS: None immediate. PROCEDURE: Informed consent was obtained from the patient following explanation of the procedure, risks, benefits and alternatives. The patient understands, agrees and consents for the procedure. All questions were addressed. A time out was performed. Maximal  barrier sterile technique utilized including caps, mask, sterile gowns, sterile gloves, large sterile drape, hand hygiene, and 2% chlorhexidine scrub. Under sterile conditions and local anesthesia, right internal jugular micropuncture venous access was performed with ultrasound. Images were obtained for documentation of the patent right internal jugular vein. A guide wire was inserted followed by a transitional dilator. Next, a 0.035 guidewire was advanced into the IVC with a 5-French catheter. Measurements  were obtained from the right venotomy site to the proximal right atrium. In the right infraclavicular chest, a subcutaneous tunnel was created under sterile conditions and local anesthesia. 1% lidocaine with epinephrine was utilized for this. The 23 cm tip to cuff palindrome catheter was tunneled subcutaneously to the venotomy site and inserted into the SVC/RA junction through a valved peel-away sheath. Position was confirmed with fluoroscopy. Images were obtained for documentation. Blood was aspirated from the catheter followed by saline and heparin flushes. The appropriate volume and strength of heparin was instilled in each lumen. Caps were applied. The catheter was secured at the tunnel site with Gelfoam and a pursestring suture. The venotomy site was closed with subcuticular Vicryl suture. Dermabond was applied to the small right neck incision. A dry sterile dressing was applied. The catheter is ready for use. No immediate complications. IMPRESSION: Ultrasound and fluoroscopically guided right internal jugular tunneled hemodialysis catheter (23 cm tip to cuff palindrome catheter). Electronically Signed   By: Jerilynn Mages.  Shick M.D.   On: 09/27/2021 11:45   IR US Guide Vasc Access Right  Result Date: 09/27/2021 INDICATION: Heart failure, LVAD patient, access for dialysis EXAM: ULTRASOUND GUIDANCE FOR VASCULAR ACCESS RIGHT INTERNAL JUGULAR PERMANENT HEMODIALYSIS CATHETER Date:  09/27/2021 09/27/2021 11:23 am Radiologist:  M. Daryll Brod, MD Guidance:  Ultrasound and fluoroscopic FLUOROSCOPY TIME:  Fluoroscopy Time: 1 minutes 36 seconds (8 mGy). MEDICATIONS: 1% lidocaine local ANESTHESIA/SEDATION: Moderate Sedation Time:  None. The patient was continuously monitored during the procedure by the interventional radiology nurse under my direct supervision. CONTRAST:  None. COMPLICATIONS: None immediate. PROCEDURE: Informed consent was obtained from the patient following explanation of the procedure, risks, benefits and  alternatives. The patient understands, agrees and consents for the procedure. All questions were addressed. A time out was performed. Maximal barrier sterile technique utilized including caps, mask, sterile gowns, sterile gloves, large sterile drape, hand hygiene, and 2% chlorhexidine scrub. Under sterile conditions and local anesthesia, right internal jugular micropuncture venous access was performed with ultrasound. Images were obtained for documentation of the patent right internal jugular vein. A guide wire was inserted followed by a transitional dilator. Next, a 0.035 guidewire was advanced into the IVC with a 5-French catheter. Measurements were obtained from the right venotomy site to the proximal right atrium. In the right infraclavicular chest, a subcutaneous tunnel was created under sterile conditions and local anesthesia. 1% lidocaine with epinephrine was utilized for this. The 23 cm tip to cuff palindrome catheter was tunneled subcutaneously to the venotomy site and inserted into the SVC/RA junction through a valved peel-away sheath. Position was confirmed with fluoroscopy. Images were obtained for documentation. Blood was aspirated from the catheter followed by saline and heparin flushes. The appropriate volume and strength of heparin was instilled in each lumen. Caps were applied. The catheter was secured at the tunnel site with Gelfoam and a pursestring suture. The venotomy site was closed with subcuticular Vicryl suture. Dermabond was applied to the small right neck incision. A dry sterile dressing was applied. The catheter is ready for use. No immediate complications. IMPRESSION:  Ultrasound and fluoroscopically guided right internal jugular tunneled hemodialysis catheter (23 cm tip to cuff palindrome catheter). Electronically Signed   By: Jerilynn Mages.  Shick M.D.   On: 09/27/2021 11:45   IR THORACENTESIS ASP PLEURAL SPACE W/IMG GUIDE  Result Date: 09/27/2021 INDICATION: Heart failure, LVAD patient, large  left effusion EXAM: ULTRASOUND GUIDED LEFT THORACENTESIS MEDICATIONS: 1% lidocaine local COMPLICATIONS: None immediate. PROCEDURE: An ultrasound guided thoracentesis was thoroughly discussed with the patient and questions answered. The benefits, risks, alternatives and complications were also discussed. The patient understands and wishes to proceed with the procedure. Written consent was obtained. Ultrasound was performed to localize and mark an adequate pocket of fluid in the left chest. The area was then prepped and draped in the normal sterile fashion. 1% Lidocaine was used for local anesthesia. Under ultrasound guidance a 6 Fr Safe-T-Centesis catheter was introduced. Thoracentesis was performed. The catheter was removed and a dressing applied. FINDINGS: A total of approximately 1 L of amber colored pleural fluid was removed. Samples were sent to the laboratory as requested by the clinical team. IMPRESSION: Successful ultrasound guided left thoracentesis yielding 1 L of pleural fluid. Electronically Signed   By: Jerilynn Mages.  Shick M.D.   On: 09/27/2021 11:43    Labs: BMET Recent Labs  Lab 09/26/21 0250 09/26/21 1628 09/27/21 0348 09/27/21 1600 09/28/21 0341 09/28/21 1951 09/29/21 0311  NA 133* 134* 135 136 132* 133* 133*  K 4.5 4.4 4.5 4.5 4.5 4.5 4.6  CL 98 103 102 104 99 102 99  CO2 _0 GLUCOSE 124* 116* 170* 116* 124* 128* 122*  BUN _1 CREATININE 1.97* 1.87* 1.78* 1.68* 1.69* 1.73* 1.60*  CALCIUM 7.9* 8.1* 7.8* 8.0* 8.0* 7.8* 8.0*  PHOS 2.8 2.6 2.2* 1.9* 2.0* 1.6* 1.8*   CBC Recent Labs  Lab 09/23/21 0350 09/23/21 0355 09/24/21 0409 09/24/21 0411 09/27/21 0348 09/28/21 0341 09/28/21 1951 09/29/21 0311  WBC 13.1*  --  11.0*   < > 13.5* 14.0* 14.2* 14.3*  NEUTROABS 10.7*  --  9.0*  --   --   --  11.5*  --   HGB 8.1*   < > 8.2*   < > 8.6* 7.7* 7.7* 7.8*  HCT 23.0*   < > 23.0*   < > 24.9* 23.5* 23.7* 23.0*  MCV 83.6  --  82.4   < > 84.4 84.8 86.5  85.8  PLT 345  --  408*   < > 496* 449* 475* 445*   < > = values in this interval not displayed.    Medications:     (feeding supplement) PROSource Plus  30 mL Oral TID WC   sodium chloride   Intravenous Once   sodium chloride   Intravenous Once   acetaminophen  500 mg Oral Q6H   aspirin  81 mg Oral Daily   bisacodyl  10 mg Oral Daily   Or   bisacodyl  10 mg Rectal Daily   chlorhexidine gluconate (MEDLINE KIT)  15 mL Mouth Rinse BID   Chlorhexidine Gluconate Cloth  6 each Topical Daily   docusate sodium  200 mg Oral Daily   feeding supplement  237 mL Oral TID BM   mouth rinse  15 mL Mouth Rinse BID   metoCLOPramide (REGLAN) injection  5 mg Intravenous Q8H   midodrine  10 mg Oral TID WC   montelukast  5 mg Oral QHS   multivitamin  1 tablet Oral  QHS   pantoprazole  40 mg Oral Daily   polyethylene glycol  17 g Oral Daily   rosuvastatin  10 mg Oral Daily   sodium chloride flush  10-40 mL Intracatheter Q12H   sodium chloride flush  3 mL Intravenous Q12H   warfarin  1 mg Oral q1600   Warfarin - Pharmacist Dosing Inpatient   Does not apply q1600      Madelon Lips, MD 09/29/2021, 9:13 AM

## 2021-09-29 NOTE — Progress Notes (Signed)
Orthopedic Tech Progress Note Patient Details:  Isaac [name] [birth year] [id number]  Ortho Devices Type of Ortho Device: Louretta Parma boot Ortho Device/Splint Location: BLE Ortho Device/Splint Interventions: Ordered, Application, Adjustment   Post Interventions Patient Tolerated: Well Instructions Provided: Care of device  [name] 09/29/2021, 5:47 PM

## 2021-09-29 NOTE — Plan of Care (Signed)
°  Problem: Health Behavior/Discharge Planning: Goal: Ability to manage health-related needs will improve Outcome: Progressing   Problem: Clinical Measurements: Goal: Ability to maintain clinical measurements within normal limits will improve Outcome: Progressing Goal: Will remain free from infection Outcome: Progressing Goal: Diagnostic test results will improve Outcome: Progressing Goal: Respiratory complications will improve Outcome: Progressing Goal: Cardiovascular complication will be avoided Outcome: Progressing   Problem: Activity: Goal: Risk for activity intolerance will decrease Outcome: Progressing   Problem: Nutrition: Goal: Adequate nutrition will be maintained Outcome: Progressing   Problem: Coping: Goal: Level of anxiety will decrease Outcome: Progressing   Problem: Elimination: Goal: Will not experience complications related to bowel motility Outcome: Progressing Goal: Will not experience complications related to urinary retention Outcome: Progressing   Problem: Pain Managment: Goal: General experience of comfort will improve Outcome: Progressing   Problem: Safety: Goal: Ability to remain free from injury will improve Outcome: Progressing   Problem: Skin Integrity: Goal: Risk for impaired skin integrity will decrease Outcome: Progressing   Problem: Education: Goal: Ability to demonstrate management of disease process will improve Outcome: Progressing Goal: Ability to verbalize understanding of medication therapies will improve Outcome: Progressing Goal: Individualized Educational Video(s) Outcome: Progressing   Problem: Activity: Goal: Capacity to carry out activities will improve Outcome: Progressing   Problem: Cardiac: Goal: Ability to achieve and maintain adequate cardiopulmonary perfusion will improve Outcome: Progressing   Problem: Education: Goal: Knowledge of the prescribed therapeutic regimen will improve Outcome: Progressing    Problem: Activity: Goal: Risk for activity intolerance will decrease Outcome: Progressing   Problem: Cardiac: Goal: Ability to maintain an adequate cardiac output will improve Outcome: Progressing   Problem: Coping: Goal: Level of anxiety will decrease Outcome: Progressing   Problem: Fluid Volume: Goal: Risk for excess fluid volume will decrease Outcome: Progressing   Problem: Clinical Measurements: Goal: Ability to maintain clinical measurements within normal limits will improve Outcome: Progressing Goal: Will remain free from infection Outcome: Progressing   Problem: Respiratory: Goal: Will regain and/or maintain adequate ventilation Outcome: Progressing

## 2021-09-30 DIAGNOSIS — Z95811 Presence of heart assist device: Secondary | ICD-10-CM | POA: Diagnosis not present

## 2021-09-30 DIAGNOSIS — I5043 Acute on chronic combined systolic (congestive) and diastolic (congestive) heart failure: Secondary | ICD-10-CM | POA: Diagnosis not present

## 2021-09-30 LAB — GLUCOSE, CAPILLARY
Glucose-Capillary: 79 mg/dL (ref 70–99)
Glucose-Capillary: 81 mg/dL (ref 70–99)
Glucose-Capillary: 65 mg/dL — ABNORMAL LOW (ref 70–99)
Glucose-Capillary: 102 mg/dL — ABNORMAL HIGH (ref 70–99)
Glucose-Capillary: 101 mg/dL — ABNORMAL HIGH (ref 70–99)

## 2021-09-30 LAB — CBC
HCT: 24.7 % — ABNORMAL LOW (ref 39.0–52.0)
Hemoglobin: 8.2 g/dL — ABNORMAL LOW (ref 13.0–17.0)
MCH: 28.8 pg (ref 26.0–34.0)
MCHC: 33.2 g/dL (ref 30.0–36.0)
MCV: 86.7 fL (ref 80.0–100.0)
Platelets: 519 10*3/uL — ABNORMAL HIGH (ref 150–400)
RBC: 2.85 MIL/uL — ABNORMAL LOW (ref 4.22–5.81)
RDW: 19.9 % — ABNORMAL HIGH (ref 11.5–15.5)
WBC: 14.1 10*3/uL — ABNORMAL HIGH (ref 4.0–10.5)
nRBC: 0 % (ref 0.0–0.2)

## 2021-09-30 LAB — CYTOLOGY - NON PAP

## 2021-09-30 LAB — RENAL FUNCTION PANEL
Albumin: 1.7 g/dL — ABNORMAL LOW (ref 3.5–5.0)
Albumin: 1.9 g/dL — ABNORMAL LOW (ref 3.5–5.0)
Anion gap: 7 (ref 5–15)
Anion gap: 8 (ref 5–15)
BUN: 16 mg/dL (ref 8–23)
BUN: 17 mg/dL (ref 8–23)
CO2: 25 mmol/L (ref 22–32)
CO2: 24 mmol/L (ref 22–32)
Calcium: 7.8 mg/dL — ABNORMAL LOW (ref 8.9–10.3)
Calcium: 7.9 mg/dL — ABNORMAL LOW (ref 8.9–10.3)
Chloride: 103 mmol/L (ref 98–111)
Chloride: 103 mmol/L (ref 98–111)
Creatinine, Ser: 1.49 mg/dL — ABNORMAL HIGH (ref 0.61–1.24)
Creatinine, Ser: 1.68 mg/dL — ABNORMAL HIGH (ref 0.61–1.24)
GFR, Estimated: 50 mL/min — ABNORMAL LOW (ref >60)
GFR, Estimated: 44 mL/min — ABNORMAL LOW (ref >60)
Glucose, Bld: 84 mg/dL (ref 70–99)
Glucose, Bld: 82 mg/dL (ref 70–99)
Phosphorus: 2.4 mg/dL — ABNORMAL LOW (ref 2.5–4.6)
Phosphorus: 2.6 mg/dL (ref 2.5–4.6)
Potassium: 4.3 mmol/L (ref 3.5–5.1)
Potassium: 4.3 mmol/L (ref 3.5–5.1)
Sodium: 135 mmol/L (ref 135–145)
Sodium: 135 mmol/L (ref 135–145)

## 2021-09-30 LAB — CULTURE, BLOOD (ROUTINE X 2)
Culture: NO GROWTH
Culture: NO GROWTH
Special Requests: ADEQUATE
Special Requests: ADEQUATE

## 2021-09-30 LAB — MAGNESIUM: Magnesium: 2.4 mg/dL (ref 1.7–2.4)

## 2021-09-30 LAB — COOXEMETRY PANEL
Carboxyhemoglobin: 1.9 % — ABNORMAL HIGH (ref 0.5–1.5)
Methemoglobin: 1 % (ref 0.0–1.5)
O2 Saturation: 59.9 %
Total hemoglobin: 7.7 g/dL — ABNORMAL LOW (ref 12.0–16.0)

## 2021-09-30 LAB — LACTATE DEHYDROGENASE: LDH: 293 U/L — ABNORMAL HIGH (ref 98–192)

## 2021-09-30 LAB — PROTIME-INR
INR: 2.5 — ABNORMAL HIGH (ref 0.8–1.2)
Prothrombin Time: 27.2 seconds — ABNORMAL HIGH (ref 11.4–15.2)

## 2021-09-30 MED ORDER — AMIODARONE HCL 200 MG PO TABS
200.0000 mg | ORAL_TABLET | Freq: Two times a day (BID) | ORAL | Status: DC
  Administered 2021-09-30 – 2021-10-07 (×16): 200 mg via ORAL
  Filled 2021-09-30 (×15): qty 1

## 2021-09-30 NOTE — Progress Notes (Signed)
LVAD Coordinator Rounding Note:  Admitted 09/06/21 due to CHF exacerbation to Dr Claris Gladden service. He was started on Milrinone on admission. Febrile n 09/10/21- suspected to have UTI- antibiotics started. Impella 5.5 placed 09/12/21.   HM III LVAD implanted on 09/17/21 by Dr Cyndia Bent under destination therapy criteria. 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   Pt lying in bed with bair hugger on. Patient denies complaints. Reports he is feeling a little better today. He has walked in the hallway and sat in the chair for a while this morning.   Reports he does not have much appetite. Encouraged increased protein intake as tolerated to promote wound healing. He verbalized understanding.   CRRT -100 cc/hr. Pt tolerating well. Scant urine output. 100+ asymptomatic PI events noted on VAD interrogation. CVP 14 this morning.   Right tunneled dialysis catheter in place. Right groin with small dressing intact.   Left arm remains swollen, propped up on pillows. Extremity warm, pt denies pain or tingling. Aline intact.   Remains in afib on Amiodarone gtt. Increased number of PVCs noted on telemetry monitoring.   Vital signs: Temp: 97.9 HR: 85-105 afib Doppler Pressure: 88 Radial Art Line: 107/66 (93) O2 Sat: 94% on 4L [place] Wt: 185.2>196.8>194>189.1>192.2>198.4>189.6>188.7>183.6 lbs    LVAD interrogation reveals:  Speed: 5600 Flow: 4.1 Power: 4.0w PI: 3.3 Hematocrit: 22  Alarms: none Events: 100+ PI events   Fixed speed: 5600 Low speed limit: 5300  Drive Line: Existing VAD dressing removed and site care performed using sterile technique. Drive line exit site cleaned with Chlora prep applicators x 2, allowed to dry, and gauze dressing with silver strip applied. Exit site not incorporated, the velour is fully implanted at exit site. 1 suture in place. Scant amount of bloody drainage noted at site and on gauze.  Slight redness, no tenderness, foul odor or rash noted. Drive line anchor secure x 2. Daily dressing changes per Nurse Davonna Belling or [place] coordinator. Next dressing change due: 10/01/21.     Labs:  LDH trend: 344>379>370>378>380>331>296>281>291>293  INR trend: 1.6>1.8>2.8>2.9>2.1>2.0>2.2>2.4>2.4>2.5  WBC trend: 14.1>13.6>13.1>11>8.7>10.1>13.5>14>14.2>14.3  Cr trend: 2.11>2.39>5.24>3.45>2.38>1.97>1.87>1.68>1.73>1.60>1.49  Hgb trend: 6.5>7.5>8.1>8.8>7.7>8.6>7.7>7.8  Anticoagulation Plan: -INR Goal: 2.0 - 2.5 -ASA Dose: 81 mg daily   Blood Products:  Intra Op: 09/17/21  1 PLT  4 FFP   4 PRBC  Cellsaver- 803 cc Post Op:  - 09/17/21>>1 PRBC - 09/18/21>> 3 PRBCs - 09/19/21>> 1 PRBC -    09/26/21>> 1 PRBC  Device: - [employer] dual -Therapies: OFF  *Will need to be referred to device clinic at discharge*  Arrythmias: 3 episodes of VT that required defib while in OR 09/17/21. Currently on Amiodarone gtt. Remains in afib.   Respiratory: extubated 09/18/22  Nitric Oxide:  off 09/18/22  Renal:  09/23/21>>started CVVH  Infection:  09/10/21>> blood cultures >> No growth 5 days; final 09/12/21>> urine culture >> No growth; final  09/25/21>>blood cultures>>pending 09/27/21>>pleural effusion>>negative, final  Drips:  Amiodarone 30 mg/hr Epinephrine 4 mcg/min Milrinone 0.125 mcg/kg/min   Patient Education: Wife not at bedside. Pt has been changing power sources. Discussed drive line care including no twisting, pulling, and keeping DL secured in anchor on at all times.   Plan/Recommendations:  1. [place] VAD coordinator for drive line or equipment concerns 2. Daily drive line dressing change per Nurse Davonna Belling or [place] coordinator   Emerson Monte RN [place] Coordinator  Office: [phone]  24/7 Pager: [phone]

## 2021-09-30 NOTE — Progress Notes (Addendum)
Patient ID: Isaac [name], male   DOB: March 27, 1951, 71 y.o.   MRN: [id number]    Advanced Heart Failure VAD Team Note  PCP-Cardiologist: None   Subjective:    12/19 RHC- RA 7, PA 39/14 (25), PCWP 11, CO 6.4 CI 3. Thermo 3.5 1.8. Lasix drip stopped after cath. 12/20 Swan removed.  Norepi 3 mcg added.  12/21 Fever. Blood CX drawn. UA + leukocytes. Started on vanc and cefepime. Diuresed with IV lasix + metolazone.  12/23 Impella 5.5 placed 12/28 HM III LVAD, ICD leads plastered to tricuspid valve with severe TR, valve not replaced and some improvement in TR with LVAD placement.  12/29 Extubated 01/03 Started CRRT 01/07  L thoracentesis and placement of [facility] 01/09 LVAD speed increased to 5600 rpm  Remains on milrinone 0.125. Off Epi and NE. Co-ox 60%. MAPs upper 60s. Lots of PI events overnight.   Remains on CVVHD. Pulling 100/hr. 2.7L pulled yesterday. Wt down 5 lb. CVP 12. Still ~20 lb above preop wt. Urine output 20cc.   On cefepime. Vanc off  Feels better today. OOB. Has ambulated this morning. Currently sitting up in chair. Able to tolerate more of his breakfast today w/ no nausea.    LVAD INTERROGATION:  HeartMate III LVAD:   Flow 4.5 liters/min, speed 5550, power 3.9, PI 2.5. Numerous PI events.  VAD interrogated personally. Parameters stable.     Objective:    Vital Signs:   Temp:  [97.3 F (36.3 C)-100.6 F (38.1 C)] 97.9 F (36.6 C) (01/10 0615) Pulse Rate:  [30-162] 30 (01/10 0515) Resp:  [7-30] 21 (01/10 0700) BP: (81-96)/(69-80) 81/69 (01/10 0410) SpO2:  [85 %-100 %] 97 % (01/10 0515) Arterial Line BP: (71-113)/(51-86) 96/61 (01/10 0700) Weight:  [83.3 kg] 83.3 kg (01/10 0645) Last BM Date: 09/29/21 Mean arterial Pressure  Upper 60s   Intake/Output:   Intake/Output Summary (Last 24 hours) at 09/30/2021 0730 Last data filed at 09/30/2021 0700 Gross per 24 hour  Intake 1613.32 ml  Output 2743 ml  Net -1129.68 ml     Physical Exam   CVP 12 General:   Well appearing, sitting up in chair. No distress.  HEENT: normal  Neck: supple. JVD 12 cm  Carotids 2+ bilat; no bruits. No lymphadenopathy or thryomegaly appreciated. [facility] cath left upper chest  Cor: LVAD hum.  Lungs: clear bilaterally  Abdomen: obese soft, nontender, + distended.Hypoactive bowel sounds. Driveline site clean. Anchor in place.  Extremities: no cyanosis, clubbing, rash. 1+ b/l LE  Neuro: alert & oriented x 3. No focal deficits. Moves all 4 without problem   Telemetry   AF 80-90s (personally reviewed)   Labs   Basic Metabolic Panel: Recent Labs  Lab 09/26/21 0250 09/26/21 1628 09/27/21 0348 09/27/21 1600 09/28/21 0341 09/28/21 1951 09/29/21 0311 09/29/21 1722 09/30/21 0317  NA 133*   < > 135   < > 132* 133* 133* 135 135  K 4.5   < > 4.5   < > 4.5 4.5 4.6 4.5 4.3  CL 98   < > 102   < > 99 102 99 102 103  CO2 26   < > 27   < > 27 27 27 25 25   GLUCOSE 124*   < > 170*   < > 124* 128* 122* 136* 84  BUN 18   < > 19   < > 17 17 15 15 16   CREATININE 1.97*   < > 1.78*   < > 1.69* 1.73* 1.60*  1.69* 1.49*  CALCIUM 7.9*   < > 7.8*   < > 8.0* 7.8* 8.0* 8.1* 7.8*  MG 2.4  --  2.4  --  2.4  --  2.4  --  2.4  PHOS 2.8   < > 2.2*   < > 2.0* 1.6* 1.8* 1.5* 2.4*   < > = values in this interval not displayed.    Liver Function Tests: Recent Labs  Lab 09/24/21 0409 09/24/21 1631 09/27/21 0348 09/27/21 1600 09/28/21 0341 09/28/21 1951 09/29/21 0311 09/29/21 1722 09/30/21 0317  AST 51*  --  35  --   --   --   --   --   --   ALT <5  --  6  --   --   --   --   --   --   ALKPHOS 154*  --  170*  --   --   --   --   --   --   BILITOT 3.2*  --  1.7*  --   --   --   --   --   --   PROT 5.8*  --  5.7*  --   --   --   --   --   --   ALBUMIN 2.0*   1.9*   < > 1.8*   1.8*   < > 1.7* 1.8* 1.7* 1.8* 1.7*   < > = values in this interval not displayed.   No results for input(s): LIPASE, AMYLASE in the last 168 hours. No results for input(s): AMMONIA in the last 168  hours.   CBC: Recent Labs  Lab 09/24/21 0409 09/24/21 0411 09/26/21 0250 09/27/21 0348 09/28/21 0341 09/28/21 1951 09/29/21 0311  WBC 11.0*   < > 10.1 13.5* 14.0* 14.2* 14.3*  NEUTROABS 9.0*  --   --   --   --  11.5*  --   HGB 8.2*   < > 7.7* 8.6* 7.7* 7.7* 7.8*  HCT 23.0*   < > 22.3* 24.9* 23.5* 23.7* 23.0*  MCV 82.4   < > 84.2 84.4 84.8 86.5 85.8  PLT 408*   < > 465* 496* 449* 475* 445*   < > = values in this interval not displayed.    INR: Recent Labs  Lab 09/26/21 0250 09/27/21 0348 09/28/21 0341 09/29/21 0311 09/30/21 0317  INR 2.0* 2.2* 2.4* 2.4* 2.5*    Other results:   Imaging   No results found.   Medications:     Scheduled Medications:  (feeding supplement) PROSource Plus  30 mL Oral TID WC   sodium chloride   Intravenous Once   sodium chloride   Intravenous Once   acetaminophen  500 mg Oral Q6H   aspirin  81 mg Oral Daily   bisacodyl  10 mg Oral Daily   Or   bisacodyl  10 mg Rectal Daily   Chlorhexidine Gluconate Cloth  6 each Topical Daily   docusate sodium  200 mg Oral Daily   feeding supplement  237 mL Oral TID BM   mouth rinse  15 mL Mouth Rinse BID   metoCLOPramide (REGLAN) injection  5 mg Intravenous Q8H   midodrine  10 mg Oral TID WC   montelukast  5 mg Oral QHS   multivitamin  1 tablet Oral QHS   pantoprazole  40 mg Oral Daily   polyethylene glycol  17 g Oral BID   rosuvastatin  10 mg Oral Daily   sodium chloride  flush  10-40 mL Intracatheter Q12H   sodium chloride flush  3 mL Intravenous Q12H   warfarin  1 mg Oral q1600   Warfarin - Pharmacist Dosing Inpatient   Does not apply q1600    Infusions:   prismasol BGK 4/2.5 400 mL/hr at 09/30/21 0100    prismasol BGK 4/2.5 200 mL/hr at 09/29/21 0527   sodium chloride Stopped (09/20/21 0304)   sodium chloride Stopped (09/18/21 0803)   amiodarone 30 mg/hr (09/30/21 0700)   ceFEPime (MAXIPIME) IV Stopped (09/29/21 2152)   epinephrine Stopped (09/29/21 2204)   lactated ringers      milrinone 0.125 mcg/kg/min (09/30/21 0700)   norepinephrine (LEVOPHED) Adult infusion Stopped (09/28/21 2218)   prismasol BGK 4/2.5 1,800 mL/hr at 09/30/21 0552   vasopressin      PRN Medications: sodium chloride, albuterol, heparin, heparin, lip balm, melatonin, morphine injection, ondansetron (ZOFRAN) IV, oxyCODONE, polyethylene glycol, sodium chloride flush, sodium chloride flush, sorbitol, traMADol   Assessment/Plan:    1. Acute on chronic systolic CHF:  Long-standing cardiomyopathy.  [place].  Echo this admission with EF < 20%, severe LV dilation, restrictive diastolic function, moderate RV dysfunction, moderate MR, mod-severe TR. Cause of cardiomyopathy is uncertain.  He has a history of CAD, but I do not think that the described CAD from the past could explain his cardiomyopathy, but CAD could have progressed.  With difficulty tolerating GDMT/need for midodrine and cardiorenal syndrome as well as profound volume overload + NYHA class IV symptoms,  concerned for low output HF. Co-ox off milrinone was 36%, milrinone started and increased to 0.375 but CO remained low. NE added and Impella 5.5 placed 12/23. EF 10% on TEE 12/23. s/p HM III VAD on 12/28.  Echo 01/03 with EF 20-25%, RV moderately reduced, septum mid-line.  Co-ox 60% on 0.125 milrinone. Off Epi and NE. Midodrine 10 tid. MAPs upper 60s. VAD interrogated personally. Parameters stable. Remains markedly fluid overloaded with weight up 20 pounds from pre-op - Main issue now is volume removal in setting of AKI/ESRD - Continue milrinone.  - Continue midodrine to keep MAP ideally >70 to promote renal perfusion/recovery.  - Pull -100/hr. Adjust pressors to facilitate volume removal. Can add VP as needed.  - Warfarin for INR 2-2.5.  INR 2.5 today  - LDH 293 - Continue 81 mg aspirin - Continue UNNA boots 2. Tricuspid regurgitation: Has moderate-severe TR.  Tricuspid repair not done at time of VAD d/t proximity of ICD  wires 3. Atrial fibrillation: Persistent since 10/22 based on device interrogation.  Was in junctional rhythm, remains in AF with HR 80-90s.   - Continue amio at 30/hr. - INR therapeutic 4. AKI on CKD stage 3: Creatinine 2.61 => 3.1 => 3.33 => 4.27 => 5.24=>1.60=>1.49 today.  Gradual rise (was about 1.7 pre-op) since surgery.  He has not been hypotensive post-op, suspect intra-op hypotension led to development of ATN => urine sediment looked like ATN per renal. Now on CVVH. TDC placed 1/7  - Maintain MAP and CO as above - Pulling for -100/hr UF.  - Nephrology following.   5. CAD: History of PCI to OM1 in 2007 and RCA in 2013.  No CP or ACs.  - Deferred cardiac cath due to AKI and plan for VAD - Continue Crestor.  6. ID: Had fever 12/21, PCT 1.03.  Blood cultures negative. ?Phlebitis at IV site. Post-op fever with elevated WBCs, PCT 2.63 -> 1.78 - Currently on cefepime. Vanc stopped.  Continue cefepime 7. Constipation:  Continue aggressive bowel regimen. improved 8. Hyponatremia: Hypervolemic hyponatremia.  Controlled via CVVH.  9. Anemia: Hgb down to 7.7 -> 8.6 after 1u RBC on 1/6. Hgb back to 7.8>>pending. No overt bleeding - continue to follow. Transfuse hgb < 7.5 10. Confusion/delirium: Initially post-op and started on Precedex.  This has cleared, now up and has been walking halls 11. F/E/N: Advance diet. Ensure ordered. Encouraged PO intake. 12. Left pleural effusion: - s/p thoracentesis 1/7 13. Constipation. Resolved. BM 1/8   I reviewed the LVAD parameters from today, and compared the results to the patient's prior recorded data.  No programming changes were made.  The LVAD is functioning within specified parameters.  The patient performs LVAD self-test daily.  LVAD interrogation was negative for any significant power changes, alarms or PI events/speed drops.  LVAD equipment check completed and is in good working order.  Back-up equipment present.   LVAD education done on emergency  procedures and precautions and reviewed exit site care.  Lyda Jester PA-C  09/30/2021 7:30 AM  Patient seen with PA, agree with the above note.    This morning, CVP 12 with co-ox 60%.  Currently getting CVVH, pulling net negative 100 cc/hr.  Weight down 5 lbs from yesterday. Hgb stable 7.8. He remains on cefepime.    No nausea today, has already walked.   Device interrogation with PI events but no low flows.    General: Well appearing this am. NAD.  HEENT: Normal. Neck: Supple, JVP 12 cm. Carotids OK.  Cardiac:  Mechanical heart sounds with LVAD hum present.  Lungs:  CTAB, normal effort.  Abdomen:  NT, ND, no HSM. No bruits or masses. +BS  LVAD exit site: Well-healed and incorporated. Dressing dry and intact. No erythema or drainage. Stabilization device present and accurately applied. Driveline dressing changed daily per sterile technique. Extremities:  Warm and dry. No cyanosis, clubbing, rash. 1+ edema to thighs.  Neuro:  Alert & oriented x 3. Cranial nerves grossly intact. Moves all 4 extremities w/o difficulty. Affect pleasant    Continue CVVH, pull net negative 100 cc/hr.    He remains on milrinone 0.125 and midodrine 10 tid, off epinephrine. MAP 70s.  - Stop milrinone today.    Can stop cefepime today.   Transition from IV amiodarone to po.     Continue to work with PT, walk in halls.  CRITICAL CARE Performed by: Loralie Champagne  Total critical care time: 35 minutes  Critical care time was exclusive of separately billable procedures and treating other patients.  Critical care was necessary to treat or prevent imminent or life-threatening deterioration.  Critical care was time spent personally by me on the following activities: development of treatment plan with patient and/or surrogate as well as nursing, discussions with consultants, evaluation of patient's response to treatment, examination of patient, obtaining history from patient or surrogate, ordering and  performing treatments and interventions, ordering and review of laboratory studies, ordering and review of radiographic studies, pulse oximetry and re-evaluation of patient's condition.  Loralie Champagne 09/30/2021 8:14 AM

## 2021-09-30 NOTE — Progress Notes (Signed)
ANTICOAGULATION CONSULT NOTE   Pharmacy Consult for Warfarin  Indication: LVAD/AF  Allergies  Allergen Reactions   Mushroom Extract Complex Nausea And Vomiting   Neosporin [Neomycin-Bacitracin Zn-Polymyx] Hives    Patient Measurements: Height: 5\' 9"  (175.3 cm) Weight: 83.3 kg (183 lb 10.3 oz) (includes system controller and patient, not batteries) IBW/kg (Calculated) : 70.7 Heparin Dosing Weight: 83.7 kg  Vital Signs: Temp: 97.3 F (36.3 C) (01/10 1138) Temp Source: Oral (01/10 1138) BP: 81/69 (01/10 0410) Pulse Rate: 90 (01/10 1200)  Labs: Recent Labs    09/28/21 0341 09/28/21 1951 09/29/21 0311 09/29/21 1722 09/30/21 0317 09/30/21 0941  HGB 7.7* 7.7* 7.8*  --   --  8.2*  HCT 23.5* 23.7* 23.0*  --   --  24.7*  PLT 449* 475* 445*  --   --  519*  LABPROT 25.7*  --  26.3*  --  27.2*  --   INR 2.4*  --  2.4*  --  2.5*  --   CREATININE 1.69* 1.73* 1.60* 1.69* 1.49*  --      Estimated Creatinine Clearance: 46.8 mL/min (A) (by C-G formula based on SCr of 1.49 mg/dL (H)).   Medical History: Past Medical History:  Diagnosis Date   Arrhythmia    atrial fibrillation   CHF (congestive heart failure) (HCC)    Chronic kidney disease    Coronary artery disease    Hyperlipidemia    Hypertension    Myocardial infarct [facility])     Assessment: 71 yo male on chronic Eliquis PTA for afib.  Now s/p LVAD placement 09/17/21, pharmacy asked to begin anticoagulation with warfarin.   INR is therapeutic at 2.5, EPW out. LDH stable and hb low stable - remain stable no bleeding noted  Goal of Therapy:  INR 2-2.5 Monitor platelets by anticoagulation protocol: Yes   Plan:  -Continue Warfarin 1 mg PO daily  -Daily INR, CBC, LDH  [place] Crane, Roylene Reason, BCCP Clinical Pharmacist  09/30/2021 1:30 PM   [facility] pharmacy phone numbers are listed on [email or web address]

## 2021-09-30 NOTE — Progress Notes (Signed)
[facility] Progress Note     Assessment/ Plan:   Acute kidney injury on CKD3b with a baseline cr in the 1.6-2 range. Acute component either from cardiorenal +/- ATN with decreased renal function from hemodynamics and CPB + hypotensive episodes + pressors.  - CRRT started on 1/3.        - Tolerating UF -> 50-100 ml/hr;  trying to avoid hypotensive episodes which will prolong recovery.        - CVP 11 this AM 10/10/21       - RIJ TC currently to help with PT.   -Cont monitoring daily I/Os + weights  -Maintain MAP>65 for optimal renal perfusion.  -Avoid nephrotoxic medications including NSAIDs, contrast. - Dose medications for a GFR <15 ml/min and also on CRRT.   HFrEF with underlying CM s/p HM3  CAD with h/o PCI last in 2013 - waiting on cath bec of renal compromise.  Anemia Afib  Subjective:    Sitting up in chair today, feeling better than yesterday.  CRRT continues at 100 mL/ hr net neg.   Objective:   BP (!) 81/69 (BP Location: Left Arm)    Pulse (!) 30    Temp 97.9 F (36.6 C)    Resp (!) 25    Ht 5\' 9"  (1.753 m)    Wt 83.3 kg Comment: includes system controller and patient, not batteries   SpO2 97%    BMI 27.12 kg/m   Intake/Output Summary (Last 24 hours) at 09/30/2021 1015 Last data filed at 09/30/2021 1000 Gross per 24 hour  Intake 1556.85 ml  Output 2605 ml  Net -1048.15 ml   Weight change: -2.3 kg  Physical Exam: GEN: NAD, A&Ox3, HEENT: wearing glasses NECK: + JVD to angle of mandible LUNGS: CTA B/L no rales, rhonchi or wheezing CV: LVAD hum ABD: + bowel sounds EXT: 3+ lower extremity edema and also present in the UE, UNNA boots, improving overall ACCESS: rt IJ TC   Imaging: No results found.  Labs: BMET Recent Labs  Lab 09/27/21 0348 09/27/21 1600 09/28/21 0341 09/28/21 1951 09/29/21 0311 09/29/21 1722 09/30/21 0317  NA 135 136 132* 133* 133* 135 135  K 4.5 4.5 4.5 4.5 4.6 4.5 4.3  CL 102 104 99 102 99 102 103  CO2 27 25 27  27 27 25 25   GLUCOSE 170* 116* 124* 128* 122* 136* 84  BUN 19 19 17 17 15 15 16   CREATININE 1.78* 1.68* 1.69* 1.73* 1.60* 1.69* 1.49*  CALCIUM 7.8* 8.0* 8.0* 7.8* 8.0* 8.1* 7.8*  PHOS 2.2* 1.9* 2.0* 1.6* 1.8* 1.5* 2.4*   CBC Recent Labs  Lab 09/24/21 0409 09/24/21 0411 09/27/21 0348 09/28/21 0341 09/28/21 1951 09/29/21 0311  WBC 11.0*   < > 13.5* 14.0* 14.2* 14.3*  NEUTROABS 9.0*  --   --   --  11.5*  --   HGB 8.2*   < > 8.6* 7.7* 7.7* 7.8*  HCT 23.0*   < > 24.9* 23.5* 23.7* 23.0*  MCV 82.4   < > 84.4 84.8 86.5 85.8  PLT 408*   < > 496* 449* 475* 445*   < > = values in this interval not displayed.    Medications:     (feeding supplement) PROSource Plus  30 mL Oral TID WC   sodium chloride   Intravenous Once   sodium chloride   Intravenous Once   acetaminophen  500 mg Oral Q6H   amiodarone  200 mg Oral BID  aspirin  81 mg Oral Daily   bisacodyl  10 mg Oral Daily   Or   bisacodyl  10 mg Rectal Daily   Chlorhexidine Gluconate Cloth  6 each Topical Daily   docusate sodium  200 mg Oral Daily   feeding supplement  237 mL Oral TID BM   mouth rinse  15 mL Mouth Rinse BID   metoCLOPramide (REGLAN) injection  5 mg Intravenous Q8H   midodrine  10 mg Oral TID WC   montelukast  5 mg Oral QHS   multivitamin  1 tablet Oral QHS   pantoprazole  40 mg Oral Daily   polyethylene glycol  17 g Oral BID   rosuvastatin  10 mg Oral Daily   sodium chloride flush  10-40 mL Intracatheter Q12H   sodium chloride flush  3 mL Intravenous Q12H   warfarin  1 mg Oral q1600   Warfarin - Pharmacist Dosing Inpatient   Does not apply q1600      Madelon Lips, MD 09/30/2021, 10:15 AM

## 2021-09-30 NOTE — Plan of Care (Signed)
°  Problem: Health Behavior/Discharge Planning: Goal: Ability to manage health-related needs will improve Outcome: Progressing   Problem: Clinical Measurements: Goal: Ability to maintain clinical measurements within normal limits will improve Outcome: Progressing Goal: Will remain free from infection Outcome: Progressing Goal: Diagnostic test results will improve Outcome: Progressing Goal: Respiratory complications will improve Outcome: Progressing Goal: Cardiovascular complication will be avoided Outcome: Progressing   Problem: Activity: Goal: Risk for activity intolerance will decrease Outcome: Progressing   Problem: Nutrition: Goal: Adequate nutrition will be maintained Outcome: Progressing   Problem: Coping: Goal: Level of anxiety will decrease Outcome: Progressing   Problem: Elimination: Goal: Will not experience complications related to bowel motility Outcome: Progressing Goal: Will not experience complications related to urinary retention Outcome: Progressing   Problem: Pain Managment: Goal: General experience of comfort will improve Outcome: Progressing   Problem: Safety: Goal: Ability to remain free from injury will improve Outcome: Progressing   Problem: Skin Integrity: Goal: Risk for impaired skin integrity will decrease Outcome: Progressing   Problem: Education: Goal: Ability to demonstrate management of disease process will improve Outcome: Progressing Goal: Ability to verbalize understanding of medication therapies will improve Outcome: Progressing Goal: Individualized Educational Video(s) Outcome: Progressing   Problem: Activity: Goal: Capacity to carry out activities will improve Outcome: Progressing   Problem: Cardiac: Goal: Ability to achieve and maintain adequate cardiopulmonary perfusion will improve Outcome: Progressing   Problem: Education: Goal: Knowledge of the prescribed therapeutic regimen will improve Outcome: Progressing    Problem: Activity: Goal: Risk for activity intolerance will decrease Outcome: Progressing   Problem: Cardiac: Goal: Ability to maintain an adequate cardiac output will improve Outcome: Progressing   Problem: Coping: Goal: Level of anxiety will decrease Outcome: Progressing   Problem: Fluid Volume: Goal: Risk for excess fluid volume will decrease Outcome: Progressing   Problem: Clinical Measurements: Goal: Ability to maintain clinical measurements within normal limits will improve Outcome: Progressing Goal: Will remain free from infection Outcome: Progressing   Problem: Respiratory: Goal: Will regain and/or maintain adequate ventilation Outcome: Progressing

## 2021-10-01 DIAGNOSIS — Z95811 Presence of heart assist device: Secondary | ICD-10-CM

## 2021-10-01 DIAGNOSIS — R570 Cardiogenic shock: Secondary | ICD-10-CM | POA: Diagnosis not present

## 2021-10-01 DIAGNOSIS — I5043 Acute on chronic combined systolic (congestive) and diastolic (congestive) heart failure: Secondary | ICD-10-CM | POA: Diagnosis not present

## 2021-10-01 DIAGNOSIS — I5022 Chronic systolic (congestive) heart failure: Secondary | ICD-10-CM

## 2021-10-01 LAB — RENAL FUNCTION PANEL
Albumin: 1.8 g/dL — ABNORMAL LOW (ref 3.5–5.0)
Albumin: 1.8 g/dL — ABNORMAL LOW (ref 3.5–5.0)
Anion gap: 9 (ref 5–15)
Anion gap: 8 (ref 5–15)
BUN: 16 mg/dL (ref 8–23)
BUN: 18 mg/dL (ref 8–23)
CO2: 24 mmol/L (ref 22–32)
CO2: 25 mmol/L (ref 22–32)
Calcium: 7.9 mg/dL — ABNORMAL LOW (ref 8.9–10.3)
Calcium: 8 mg/dL — ABNORMAL LOW (ref 8.9–10.3)
Chloride: 103 mmol/L (ref 98–111)
Chloride: 102 mmol/L (ref 98–111)
Creatinine, Ser: 1.52 mg/dL — ABNORMAL HIGH (ref 0.61–1.24)
Creatinine, Ser: 1.5 mg/dL — ABNORMAL HIGH (ref 0.61–1.24)
GFR, Estimated: 49 mL/min — ABNORMAL LOW (ref >60)
GFR, Estimated: 50 mL/min — ABNORMAL LOW (ref >60)
Glucose, Bld: 101 mg/dL — ABNORMAL HIGH (ref 70–99)
Glucose, Bld: 98 mg/dL (ref 70–99)
Phosphorus: 2 mg/dL — ABNORMAL LOW (ref 2.5–4.6)
Phosphorus: 2.3 mg/dL — ABNORMAL LOW (ref 2.5–4.6)
Potassium: 4.1 mmol/L (ref 3.5–5.1)
Potassium: 4.7 mmol/L (ref 3.5–5.1)
Sodium: 136 mmol/L (ref 135–145)
Sodium: 135 mmol/L (ref 135–145)

## 2021-10-01 LAB — CBC
HCT: 23.8 % — ABNORMAL LOW (ref 39.0–52.0)
Hemoglobin: 7.8 g/dL — ABNORMAL LOW (ref 13.0–17.0)
MCH: 28.7 pg (ref 26.0–34.0)
MCHC: 32.8 g/dL (ref 30.0–36.0)
MCV: 87.5 fL (ref 80.0–100.0)
Platelets: 541 10*3/uL — ABNORMAL HIGH (ref 150–400)
RBC: 2.72 MIL/uL — ABNORMAL LOW (ref 4.22–5.81)
RDW: 20.1 % — ABNORMAL HIGH (ref 11.5–15.5)
WBC: 13.2 10*3/uL — ABNORMAL HIGH (ref 4.0–10.5)
nRBC: 0 % (ref 0.0–0.2)

## 2021-10-01 LAB — BRAIN NATRIURETIC PEPTIDE: B Natriuretic Peptide: 348.9 pg/mL — ABNORMAL HIGH (ref 0.0–100.0)

## 2021-10-01 LAB — GLUCOSE, CAPILLARY
Glucose-Capillary: 102 mg/dL — ABNORMAL HIGH (ref 70–99)
Glucose-Capillary: 67 mg/dL — ABNORMAL LOW (ref 70–99)
Glucose-Capillary: 74 mg/dL (ref 70–99)
Glucose-Capillary: 74 mg/dL (ref 70–99)
Glucose-Capillary: 89 mg/dL (ref 70–99)
Glucose-Capillary: 93 mg/dL (ref 70–99)
Glucose-Capillary: 96 mg/dL (ref 70–99)

## 2021-10-01 LAB — PROTIME-INR
INR: 2.3 — ABNORMAL HIGH (ref 0.8–1.2)
Prothrombin Time: 25.4 seconds — ABNORMAL HIGH (ref 11.4–15.2)

## 2021-10-01 LAB — MAGNESIUM: Magnesium: 2.3 mg/dL (ref 1.7–2.4)

## 2021-10-01 LAB — COOXEMETRY PANEL
Carboxyhemoglobin: 2.5 % — ABNORMAL HIGH (ref 0.5–1.5)
Methemoglobin: 1 % (ref 0.0–1.5)
O2 Saturation: 57 %
Total hemoglobin: 6.9 g/dL — CL (ref 12.0–16.0)

## 2021-10-01 LAB — LACTATE DEHYDROGENASE: LDH: 295 U/L — ABNORMAL HIGH (ref 98–192)

## 2021-10-01 MED ORDER — KATE FARMS STANDARD 1.4 PO LIQD
325.0000 mL | Freq: Three times a day (TID) | ORAL | Status: DC
  Administered 2021-10-02: 325 mL via ORAL
  Filled 2021-10-01 (×7): qty 325

## 2021-10-01 MED ORDER — DEXTROSE 50 % IV SOLN
12.5000 g | INTRAVENOUS | Status: AC
  Administered 2021-10-01: 12.5 g via INTRAVENOUS
  Filled 2021-10-01: qty 50

## 2021-10-01 MED ORDER — SODIUM CHLORIDE 0.9 % IV SOLN: INTRAVENOUS | Status: DC | PRN

## 2021-10-01 MED ORDER — SODIUM CHLORIDE 0.9 % IV SOLN
12.5000 mg | Freq: Four times a day (QID) | INTRAVENOUS | Status: DC | PRN
  Administered 2021-10-01 – 2021-10-10 (×2): 12.5 mg via INTRAVENOUS
  Filled 2021-10-01: qty 0.5
  Filled 2021-10-01: qty 12.5

## 2021-10-01 MED ORDER — POLYETHYLENE GLYCOL 3350 17 G PO PACK
17.0000 g | PACK | Freq: Every day | ORAL | Status: DC
  Administered 2021-10-04 – 2021-10-05 (×2): 17 g via ORAL
  Filled 2021-10-01 (×3): qty 1

## 2021-10-01 MED ORDER — MIDODRINE HCL 5 MG PO TABS
15.0000 mg | ORAL_TABLET | Freq: Three times a day (TID) | ORAL | Status: DC
  Administered 2021-10-01 – 2021-10-07 (×19): 15 mg via ORAL
  Filled 2021-10-01 (×19): qty 3

## 2021-10-01 MED ORDER — SODIUM CHLORIDE 0.9 % IV SOLN
12.5000 mg | Freq: Four times a day (QID) | INTRAVENOUS | Status: DC | PRN
Start: 2021-10-01 — End: 2021-10-01

## 2021-10-01 NOTE — Progress Notes (Signed)
Inpatient Rehab Admissions Coordinator:   Per therapy recommendation,  patient was screened for CIR candidacy by Clemens Catholic, MS, CCC-SLP  At this time, Pt. Remains on CRRT and is not medically ready for CIR. I will not pursue a rehab consult for this Pt. at this time, but CIR admissions team will follow and monitor for medical readiness and place consult order if Pt. appears to be an appropriate candidate. Please contact me with any questions.   Clemens Catholic, [place], [place] Admissions Coordinator  [phone] ([place]) [phone] (office)

## 2021-10-01 NOTE — Progress Notes (Addendum)
OT Cancellation Note  Patient Details Name: Isaac [name] MRN: [id number] DOB: 07/27/1951   Cancelled Treatment:    Reason Eval/Treat Not Completed: Medical issues which prohibited therapy pt with nausea after lunch.  [name] 10/01/2021, 3:22 PM Nestor Lewandowsky, OTR/L Acute Rehabilitation Services Pager: [phone] Office: [phone]

## 2021-10-01 NOTE — Plan of Care (Signed)
°  Problem: Health Behavior/Discharge Planning: Goal: Ability to manage health-related needs will improve Outcome: Progressing   Problem: Clinical Measurements: Goal: Ability to maintain clinical measurements within normal limits will improve Outcome: Progressing Goal: Will remain free from infection Outcome: Progressing Goal: Diagnostic test results will improve Outcome: Progressing Goal: Respiratory complications will improve Outcome: Progressing Goal: Cardiovascular complication will be avoided Outcome: Progressing   Problem: Activity: Goal: Risk for activity intolerance will decrease Outcome: Progressing   Problem: Nutrition: Goal: Adequate nutrition will be maintained Outcome: Progressing   Problem: Coping: Goal: Level of anxiety will decrease Outcome: Progressing   Problem: Elimination: Goal: Will not experience complications related to bowel motility Outcome: Progressing Goal: Will not experience complications related to urinary retention Outcome: Progressing   Problem: Pain Managment: Goal: General experience of comfort will improve Outcome: Progressing   Problem: Safety: Goal: Ability to remain free from injury will improve Outcome: Progressing   Problem: Skin Integrity: Goal: Risk for impaired skin integrity will decrease Outcome: Progressing   Problem: Education: Goal: Ability to demonstrate management of disease process will improve Outcome: Progressing Goal: Ability to verbalize understanding of medication therapies will improve Outcome: Progressing Goal: Individualized Educational Video(s) Outcome: Progressing   Problem: Activity: Goal: Capacity to carry out activities will improve Outcome: Progressing   Problem: Cardiac: Goal: Ability to achieve and maintain adequate cardiopulmonary perfusion will improve Outcome: Progressing   Problem: Education: Goal: Knowledge of the prescribed therapeutic regimen will improve Outcome: Progressing    Problem: Activity: Goal: Risk for activity intolerance will decrease Outcome: Progressing   Problem: Cardiac: Goal: Ability to maintain an adequate cardiac output will improve Outcome: Progressing   Problem: Coping: Goal: Level of anxiety will decrease Outcome: Progressing   Problem: Fluid Volume: Goal: Risk for excess fluid volume will decrease Outcome: Progressing   Problem: Clinical Measurements: Goal: Ability to maintain clinical measurements within normal limits will improve Outcome: Progressing Goal: Will remain free from infection Outcome: Progressing   Problem: Respiratory: Goal: Will regain and/or maintain adequate ventilation Outcome: Progressing

## 2021-10-01 NOTE — Progress Notes (Signed)
[facility] Progress Note     Assessment/ Plan:   Acute kidney injury on CKD3b with a baseline cr in the 1.6-2 range. Acute component either from cardiorenal +/- ATN with decreased renal function from hemodynamics and CPB + hypotensive episodes + pressors.  - CRRT started on 1/3.        - Tolerating UF -> 50-100 ml/hr;  trying to avoid hypotensive episodes which will prolong recovery.       - filter clotted this AM- switching to M150 to see if we can mitigate that        - CVP 14 this AM 10/01/21         -Cont monitoring daily I/Os + weights  -Maintain MAP>65 for optimal renal perfusion.  -Avoid nephrotoxic medications including NSAIDs, contrast. - Dose medications for a GFR <15 ml/min and also on CRRT.   HFrEF with underlying CM s/p HM3  CAD with h/o PCI last in 2013 - waiting on cath bec of renal compromise.  Anemia Afib  Subjective:    Went back on epi gtt last night- to be weaned off today.  Walking in the halls this AM.  Hgb was 6.9 on co-ox, now 7.8 on STAT cbc.     Objective:   BP (!) 81/69 (BP Location: Left Arm)    Pulse (!) 30    Temp 98.2 F (36.8 C)    Resp (!) 28    Ht 5\' 9"  (1.753 m)    Wt 84 kg    SpO2 100%    BMI 27.35 kg/m   Intake/Output Summary (Last 24 hours) at 10/01/2021 1023 Last data filed at 10/01/2021 1000 Gross per 24 hour  Intake 1863.36 ml  Output 1037 ml  Net 826.36 ml   Weight change: 0.7 kg  Physical Exam: GEN: NAD, A&Ox3, HEENT: wearing glasses NECK: + JVD to angle of mandible LUNGS: CTA B/L no rales, rhonchi or wheezing CV: LVAD hum ABD: + bowel sounds EXT: 3+ lower extremity edema and also present in the UE, UNNA boots, improving overall ACCESS: rt IJ TC   Imaging: No results found.  Labs: BMET Recent Labs  Lab 09/28/21 0341 09/28/21 1951 09/29/21 0311 09/29/21 1722 09/30/21 0317 09/30/21 1747 10/01/21 0345  NA 132* 133* 133* 135 135 135 136  K 4.5 4.5 4.6 4.5 4.3 4.3 4.1  CL 99 102 99 102 103 103 103   CO2 27 27 27 25 25 24 24   GLUCOSE 124* 128* 122* 136* 84 82 101*  BUN 17 17 15 15 16 17 16   CREATININE 1.69* 1.73* 1.60* 1.69* 1.49* 1.68* 1.52*  CALCIUM 8.0* 7.8* 8.0* 8.1* 7.8* 7.9* 7.9*  PHOS 2.0* 1.6* 1.8* 1.5* 2.4* 2.6 2.0*   CBC Recent Labs  Lab 09/28/21 1951 09/29/21 0311 09/30/21 0941 10/01/21 0927  WBC 14.2* 14.3* 14.1* 13.2*  NEUTROABS 11.5*  --   --   --   HGB 7.7* 7.8* 8.2* 7.8*  HCT 23.7* 23.0* 24.7* 23.8*  MCV 86.5 85.8 86.7 87.5  PLT 475* 445* 519* 541*    Medications:     (feeding supplement) PROSource Plus  30 mL Oral TID WC   sodium chloride   Intravenous Once   sodium chloride   Intravenous Once   acetaminophen  500 mg Oral Q6H   amiodarone  200 mg Oral BID   aspirin  81 mg Oral Daily   Chlorhexidine Gluconate Cloth  6 each Topical Daily   docusate sodium  200 mg Oral  Daily   feeding supplement  237 mL Oral TID BM   mouth rinse  15 mL Mouth Rinse BID   metoCLOPramide (REGLAN) injection  5 mg Intravenous Q8H   midodrine  15 mg Oral TID WC   montelukast  5 mg Oral QHS   multivitamin  1 tablet Oral QHS   pantoprazole  40 mg Oral Daily   polyethylene glycol  17 g Oral Daily   rosuvastatin  10 mg Oral Daily   sodium chloride flush  10-40 mL Intracatheter Q12H   sodium chloride flush  3 mL Intravenous Q12H   warfarin  1 mg Oral q1600   Warfarin - Pharmacist Dosing Inpatient   Does not apply q1600      Madelon Lips, MD 10/01/2021, 10:23 AM

## 2021-10-01 NOTE — Progress Notes (Signed)
Physical Therapy Treatment Patient Details Name: Isaac [name] MRN: [id number] DOB: [birth year] Today's Date: 10/01/2021   History of Present Illness Pt is a 71 y.o. male admitted 09/07/21 with near-syncope, nausea, poor oral intake, generalized weakness. Workup for advanced heart failure and persistent inotropic with requirement, cardiogenic shock;. [place] 12/19. Impella placed on 12/23. LVAD placed 12/28. Pt with post-op delirium and agitation. CRRT initiated 1/3. PMH includes advanced CHF (LVEF <20%), AICD/PPM, PAF on Eliquis, CKD 3, HTN, HLD.    PT Comments    Pt continues to make steady progress with mobility. Pt continues to fatigue with activity and believe he could benefit from inpatient rehab stay prior to return home. Pt continues to have multiple medical issues which are slowing progress.    Recommendations for follow up therapy are one component of a multi-disciplinary discharge planning process, led by the attending physician.  Recommendations may be updated based on patient status, additional functional criteria and insurance authorization.  Follow Up Recommendations  Acute inpatient rehab (3hours/day)     Assistance Recommended at Discharge Frequent or constant Supervision/Assistance  Patient can return home with the following A little help with walking and/or transfers;Help with stairs or ramp for entrance   Equipment Recommendations  Rollator (4 wheels);BSC/3in1    Recommendations for Other Services       Precautions / Restrictions Precautions Precautions: Fall;Sternal;Other (comment) Precaution Comments: LVAD, CRRT     Mobility  Bed Mobility Overal bed mobility: Needs Assistance Bed Mobility: Sit to Supine       Sit to supine: Max assist   General bed mobility comments: Assist to lower trunk and bring legs back into bed.    Transfers Overall transfer level: Needs assistance Equipment used:  [place] walker) Transfers: Sit to/from Stand Sit to Stand: Min  assist;+2 safety/equipment           General transfer comment: Assist to bring hips up and to steady to rise. Pt with hands to knees for sternal precautions    Ambulation/Gait Ambulation/Gait assistance: Min assist;+2 safety/equipment Gait Distance (Feet): 150 Feet (x 2) Assistive device: Ethelene Hal Gait Pattern/deviations: Step-through pattern;Decreased stride length;Trunk flexed Gait velocity: decr Gait velocity interpretation: <1.31 ft/sec, indicative of household ambulator   General Gait Details: Assist for balance and safety. Intermittent verbal cues to extend hips and trunk fully. Pt with several standing rest breaks.   Stairs             Wheelchair Mobility    Modified Rankin (Stroke Patients Only)       Balance Overall balance assessment: Needs assistance Sitting-balance support: No upper extremity supported;Feet supported Sitting balance-Leahy Scale: Fair     Standing balance support: No upper extremity supported Standing balance-Leahy Scale: Fair Standing balance comment: static standing without UE support                            Cognition Arousal/Alertness: Awake/alert Behavior During Therapy: WFL for tasks assessed/performed;Flat affect Overall Cognitive Status: Within Functional Limits for tasks assessed                                          Exercises      General Comments General comments (skin integrity, edema, etc.): VSS on 4L      Pertinent Vitals/Pain Pain Assessment: Faces Faces Pain Scale: Hurts a little bit Pain Location: generalized  Pain Descriptors / Indicators: Grimacing Pain Intervention(s): Limited activity within patient's tolerance    Home Living                          Prior Function            PT Goals (current goals can now be found in the care plan section) Acute Rehab PT Goals Patient Stated Goal: home Progress towards PT goals: Progressing toward goals     Frequency    Min 3X/week      PT Plan Discharge plan needs to be updated    Co-evaluation PT/OT/SLP Co-Evaluation/Treatment: Yes            AM-PAC PT "6 Clicks" Mobility   Outcome Measure  Help needed turning from your back to your side while in a flat bed without using bedrails?: A Little Help needed moving from lying on your back to sitting on the side of a flat bed without using bedrails?: A Little Help needed moving to and from a bed to a chair (including a wheelchair)?: A Little Help needed standing up from a chair using your arms (e.g., wheelchair or bedside chair)?: A Little Help needed to walk in hospital room?: A Little Help needed climbing 3-5 steps with a railing? : Total 6 Click Score: 16    End of Session Equipment Utilized During Treatment: Oxygen Activity Tolerance: Patient tolerated treatment well Patient left: with nursing/sitter in room;with family/visitor present;in bed Nurse Communication: Mobility status (nurse assisting with mobility) PT Visit Diagnosis: Unsteadiness on feet (R26.81);Other abnormalities of gait and mobility (R26.89);Muscle weakness (generalized) (M62.81)     Time: 8828-0034 PT Time Calculation (min) (ACUTE ONLY): 18 min  Charges:  $Gait Training: 8-22 mins                     [place] Pager [phone] [place] 10/01/2021, 9:30 AM

## 2021-10-01 NOTE — Progress Notes (Signed)
Patient ID: Isaac [name], male   DOB: 16-Jul-1951, 71 y.o.   MRN: [id number] HeartMate 3 Rounding Note  Subjective:    Feels ok this morning. Hooking to batteries for a walk. Had loose stools this am due to stool softeners and laxative.  Co-ox 57 on epi 2. It has been titrated for MAP  -258 cc/24 hrs. Wt up 2.5 lbs from yesterday. 12 lbs over preop  LVAD INTERROGATION:  HeartMate IIl LVAD:  Flow 4.5 liters/min, speed 5600, power 4, PI 2.  Objective:    Vital Signs:   Temp:  [97.3 F (36.3 C)-99.1 F (37.3 C)] 98.2 F (36.8 C) (01/11 0600) Pulse Rate:  [29-146] 30 (01/11 0600) Resp:  [10-29] 28 (01/11 0700) SpO2:  [69 %-100 %] 100 % (01/11 0600) Arterial Line BP: (64-115)/(48-78) 101/78 (01/11 0700) Weight:  [84 kg] 84 kg (01/11 0500) Last BM Date: 10/01/21 Mean arterial Pressure 70's-90  Intake/Output:   Intake/Output Summary (Last 24 hours) at 10/01/2021 1044 Last data filed at 10/01/2021 1000 Gross per 24 hour  Intake 1863.36 ml  Output 1037 ml  Net 826.36 ml     Physical Exam: General:  Well appearing. No resp difficulty HEENT: normal Neck: Normal Cor: Distant heart sounds with LVAD hum present. Lungs: clear Abdomen: soft, nontender, nondistended. Good bowel sounds. Extremities: moderate diffuse edema Neuro: alert & orientedx3, cranial nerves grossly intact. moves all 4 extremities w/o difficulty. Affect pleasant  Telemetry: sinus  Labs: Basic Metabolic Panel: Recent Labs  Lab 09/27/21 0348 09/27/21 1600 09/28/21 0341 09/28/21 1951 09/29/21 0311 09/29/21 1722 09/30/21 0317 09/30/21 1747 10/01/21 0345  NA 135   < > 132*   < > 133* 135 135 135 136  K 4.5   < > 4.5   < > 4.6 4.5 4.3 4.3 4.1  CL 102   < > 99   < > 99 102 103 103 103  CO2 27   < > 27   < > 27 25 25 24 24   GLUCOSE 170*   < > 124*   < > 122* 136* 84 82 101*  BUN 19   < > 17   < > 15 15 16 17 16   CREATININE 1.78*   < > 1.69*   < > 1.60* 1.69* 1.49* 1.68* 1.52*  CALCIUM 7.8*   < > 8.0*    < > 8.0* 8.1* 7.8* 7.9* 7.9*  MG 2.4  --  2.4  --  2.4  --  2.4  --  2.3  PHOS 2.2*   < > 2.0*   < > 1.8* 1.5* 2.4* 2.6 2.0*   < > = values in this interval not displayed.    Liver Function Tests: Recent Labs  Lab 09/27/21 0348 09/27/21 1600 09/29/21 0311 09/29/21 1722 09/30/21 0317 09/30/21 1747 10/01/21 0345  AST 35  --   --   --   --   --   --   ALT 6  --   --   --   --   --   --   ALKPHOS 170*  --   --   --   --   --   --   BILITOT 1.7*  --   --   --   --   --   --   PROT 5.7*  --   --   --   --   --   --   ALBUMIN 1.8*   1.8*   < > 1.7*  1.8* 1.7* 1.9* 1.8*   < > = values in this interval not displayed.   No results for input(s): LIPASE, AMYLASE in the last 168 hours. No results for input(s): AMMONIA in the last 168 hours.  CBC: Recent Labs  Lab 09/28/21 0341 09/28/21 1951 09/29/21 0311 09/30/21 0941 10/01/21 0927  WBC 14.0* 14.2* 14.3* 14.1* 13.2*  NEUTROABS  --  11.5*  --   --   --   HGB 7.7* 7.7* 7.8* 8.2* 7.8*  HCT 23.5* 23.7* 23.0* 24.7* 23.8*  MCV 84.8 86.5 85.8 86.7 87.5  PLT 449* 475* 445* 519* 541*    INR: Recent Labs  Lab 09/27/21 0348 09/28/21 0341 09/29/21 0311 09/30/21 0317 10/01/21 0345  INR 2.2* 2.4* 2.4* 2.5* 2.3*    Other results: EKG:   Imaging: No results found.   Medications:     Scheduled Medications:  (feeding supplement) PROSource Plus  30 mL Oral TID WC   sodium chloride   Intravenous Once   sodium chloride   Intravenous Once   acetaminophen  500 mg Oral Q6H   amiodarone  200 mg Oral BID   aspirin  81 mg Oral Daily   Chlorhexidine Gluconate Cloth  6 each Topical Daily   docusate sodium  200 mg Oral Daily   feeding supplement  237 mL Oral TID BM   mouth rinse  15 mL Mouth Rinse BID   metoCLOPramide (REGLAN) injection  5 mg Intravenous Q8H   midodrine  15 mg Oral TID WC   montelukast  5 mg Oral QHS   multivitamin  1 tablet Oral QHS   pantoprazole  40 mg Oral Daily   polyethylene glycol  17 g Oral Daily    rosuvastatin  10 mg Oral Daily   sodium chloride flush  10-40 mL Intracatheter Q12H   sodium chloride flush  3 mL Intravenous Q12H   warfarin  1 mg Oral q1600   Warfarin - Pharmacist Dosing Inpatient   Does not apply q1600    Infusions:   prismasol BGK 4/2.5 400 mL/hr at 10/01/21 0550    prismasol BGK 4/2.5 200 mL/hr at 09/30/21 1535   sodium chloride Stopped (09/20/21 0304)   sodium chloride Stopped (09/18/21 0803)   sodium chloride 20 mL/hr at 10/01/21 1000   epinephrine 1 mcg/min (10/01/21 1000)   lactated ringers     prismasol BGK 4/2.5 1,800 mL/hr at 10/01/21 0400    PRN Medications: sodium chloride, sodium chloride, albuterol, heparin, heparin, lip balm, melatonin, morphine injection, ondansetron (ZOFRAN) IV, oxyCODONE, polyethylene glycol, sodium chloride flush, sodium chloride flush, traMADol   Assessment:   POD 1 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 Moderate RV systolic dysfunction by echo preop but looked better after LVAD insertion in OR. 2.   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 Decided against removal of ICD/pacer and TV replacement. Improved a little at the end of surgery. 3.  Hx of atrial fib on amio. Has been in and out postop. 4.   Stage 3 CKD with postop ATN and anuric renal failure.  CRRT started for volume removal.  5.   CAD with PCI of OM1 in 2007 and RCA in 2013. 6.   Acute postop blood loss anemia. stable 7.   Left pleural effusion and LLL compressive atelectasis. s/p left thoracentesis. 8.   Low grade fevers and elevated procalcitonin  with WBC trending up. BC and UC negative.  Completed  empiric antibiotics.  Plan/Discussion:    Overall he is making continued progress with mobilization, nutrition.  Hemodynamics have been fairly stable but still requires epi intermittently for drop in MAP.    Continuing CRRT for volume removal.  DC foley and arterial line.  Continue IS, ambulation.  INR therapeutic. Coumadin per pharmacy.  I reviewed the LVAD parameters from today, and compared the results to the patient's prior recorded data.  No programming changes were made.  The LVAD is functioning within specified parameters.  The patient performs LVAD self-test daily.  LVAD interrogation was negative for any significant power changes, alarms or PI events/speed drops.  LVAD equipment check completed and is in good working order.  Back-up equipment present.   LVAD education done on emergency procedures and precautions and reviewed exit site care.  Length of Stay: [address]  Fernande Boyden [facility] 10/01/2021

## 2021-10-01 NOTE — Progress Notes (Signed)
Calorie Count Note  48 hour calorie count ordered.  +nausea and vomiting continues at times. Per RN, pt vomited today after eating fish at lunch. Wife is bringing in food from home for pt, which we have encouraged, and include protein smoothies that contain 27 g of protein but are also 600 mL in volume-this may be contributing to patients fullness, not wanting to eat meals.   Pt currently not really eating anything solid.   On visit today, pt did not eat breakfast but did drink 600 mL smoothie from wife and took 2 Pro-Source packets  Pt remains on CRRT, ambulated 2x prior to my visit this AM. +large liquid BMs  Diet: REGULAR Supplements: Ensure Enlive TID (can substitute with Boost Breeze prn), Pro-Source 30 mL TID  Breakfast: Tomato Soup, Gingerale Lunch: Protein Smoothie made by wife Dinner: ate some dinner per [employer] charting but does not list items (RN estimated 300-400 kcals but no estimates for protein) Supplements: None  Discussed with RN today that should pt eat anything from meal tray, to document on tray ticket or cal count sheet to include item eaten and percent consumed.   Total intake: 580 kcal (25% of low end of estimated calories) 27 protein (20% of low end of protein estimates)   Nutrition Dx:    Severe Malnutrition related to chronic illness (heart failure) as evidenced by severe fat depletion, severe muscle depletion.  Goal:  Meet >/= 75% of needs Estimated calorie needs: 2300-2500 kcals  Estimated protein needs: 135-160 g  Intervention:  Plan to trial [place] [employer] as this is plant based, organic, vegan. Pt may take by mouth but we also have [employer] [place] TF on enteral formula should pt agree to [employer]  Continue protein shakes from wife; wife may bring in other foods as well  Continue Ensure Enlive po TID, each supplement provides 350 kcal and 20 grams of protein  Continue Pro-Source 30 mL TID  Continue liberalized diet  Continue to cater pt; pt is  pescatarian; eats fish and eggs.   Discussed recommendation of Cortrak tube with Dr. Aundra Dubin, pt however does not want Cortrak; MD to discuss further with pt. If continues to vomit, would likely benefit from post-pyloric placement  Kerman Passey MS, RDN, LDN, CNSC Registered Dietitian III Clinical Nutrition RD Pager and On-Call Pager Number Located in [place]

## 2021-10-01 NOTE — Progress Notes (Signed)
LVAD Coordinator Rounding Note:  Admitted 09/06/21 due to CHF exacerbation to Dr Claris Gladden service. He was started on Milrinone on admission. Febrile n 09/10/21- suspected to have UTI- antibiotics started. Impella 5.5 placed 09/12/21.   HM III LVAD implanted on 09/17/21 by Dr Cyndia Bent under destination therapy criteria. 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   Pt sitting on bedside commode this am. Planned walk afterward with PT. Wife at bedside. Will plan on dressing change per wife after walk complete.    CRRT on hold at this time due to clotted circuit. Re-started after @ 2 hour per [facility] nurse.    Vital signs: Temp: 98.2 HR: 93 afib Doppler Pressure: not documented Radial Art Line: 101/78 O2 Sat: 100% on 4L [place] Wt: 185.2>196.8>194>189.1>192.2>198.4>189.6>188.7>183.6 lbs    LVAD interrogation reveals:  Speed: 5600 Flow: 4.2 Power: 4.1w PI: 2.7 Hematocrit: 23  Alarms: none Events:  >100 PI events today  Fixed speed: 5600 Low speed limit: 5300  Drive Line: Existing VAD dressing removed and site care performed using sterile technique by [name] (wife). Drive line exit site cleaned with Chlora prep applicators x 2, allowed to dry, and gauze dressing with silver strip applied. Exit site not incorporated, the velour is fully implanted at exit site. 1 suture in place. Scant amount of bloody drainage noted at site and on gauze. Slight redness, no tenderness, foul odor or rash noted. Drive line anchor secure. Will advance to M/W/F dressing changes per Nurse Davonna Belling, [place] coordinator, or trained caregiver.  Next dressing change due: 10/03/21.     Labs:  LDH trend: 344>379>370>378>380>331>296>281>291>293>295  INR trend: 1.6>1.8>2.8>2.9>2.1>2.0>2.2>2.4>2.4>2.5>2.3  WBC trend: 14.1>13.6>13.1>11>8.7>10.1>13.5>14>14.2>14.3>13.2  Cr trend: 2.11>2.39>5.24>3.45>2.38>1.97>1.87>1.68>1.73>1.60>1.49>1.52  Hgb trend:  6.5>7.5>8.1>8.8>7.7>8.6>7.7>7.8  Anticoagulation Plan: -INR Goal: 2.0 - 2.5 -ASA Dose: 81 mg daily   Blood Products:  Intra Op: 09/17/21  1 PLT  4 FFP   4 PRBC  Cellsaver- 803 cc Post Op:  - 09/17/21>>1 PRBC - 09/18/21>> 3 PRBCs - 09/19/21>> 1 PRBC -    09/26/21>> 1 PRBC  Device: - [employer] dual -Therapies: OFF  *Will need to be referred to device clinic at discharge*  Arrythmias: 3 episodes of VT that required defib while in OR 09/17/21. Currently on Amiodarone gtt. Remains in afib.   Respiratory: extubated 09/18/22  Nitric Oxide:  off 09/18/22  Renal:  09/23/21>>started CVVH  Infection:  09/10/21>> blood cultures >> No growth 5 days; final 09/12/21>> urine culture >> No growth; final  09/25/21>>blood cultures>>negative  09/27/21>>pleural effusion>>negative, final  Drips:  Epinephrine 0.5 mcg/min   Patient Education: Wife performed dressing change under VAD Coordinator supervision. She did very well with sterile technique and voiced understanding of same.  Encouraged her to complete reading of HM III Patient Handbook. She voiced she learns better by watching/listening. Informed her we will meet this need with discharge teaching including hands on portion when patient feeling better. She verbalized understanding of same.   Plan/Recommendations:  1. Page VAD coordinator for drive line or equipment concerns 2. Daily drive line dressing change per Nurse Davonna Belling or VAD coordinator   Zada Girt RN [place] Coordinator  Office: [phone]  24/7 Pager: [phone]

## 2021-10-01 NOTE — Progress Notes (Signed)
[place] for Warfarin  Indication: LVAD/AF  Allergies  Allergen Reactions   Mushroom Extract Complex Nausea And Vomiting   Neosporin [Neomycin-Bacitracin Zn-Polymyx] Hives    Patient Measurements: Height: 5\' 9"  (175.3 cm) Weight: 84 kg (185 lb 3 oz) IBW/kg (Calculated) : 70.7 Heparin Dosing Weight: 83.7 kg  Vital Signs: Temp: 98.2 F (36.8 C) (01/11 0600) Temp Source: Bladder (01/11 0000) Pulse Rate: 30 (01/11 0600)  Labs: Recent Labs    09/28/21 1951 09/29/21 0311 09/29/21 1722 09/30/21 0317 09/30/21 0941 09/30/21 1747 10/01/21 0345  HGB 7.7* 7.8*  --   --  8.2*  --   --   HCT 23.7* 23.0*  --   --  24.7*  --   --   PLT 475* 445*  --   --  519*  --   --   LABPROT  --  26.3*  --  27.2*  --   --  25.4*  INR  --  2.4*  --  2.5*  --   --  2.3*  CREATININE 1.73* 1.60*   < > 1.49*  --  1.68* 1.52*   < > = values in this interval not displayed.     Estimated Creatinine Clearance: 45.9 mL/min (A) (by C-G formula based on SCr of 1.52 mg/dL (H)).   Medical History: Past Medical History:  Diagnosis Date   Arrhythmia    atrial fibrillation   CHF (congestive heart failure) (HCC)    Chronic kidney disease    Coronary artery disease    Hyperlipidemia    Hypertension    Myocardial infarct [facility])     Assessment: 71 yo male on chronic Eliquis PTA for afib.  Now s/p LVAD placement 09/17/21, pharmacy asked to begin anticoagulation with warfarin.   INR is therapeutic at 2.3, EPW out. LDH stable and hemoglobin low stable,no overt bleeding noted  Goal of Therapy:  INR 2-2.5 Monitor platelets by anticoagulation protocol: Yes   Plan:  -Continue Warfarin 1 mg PO daily  -Daily INR, CBC, LDH  [place] Crane, Roylene Reason, [facility] Clinical Pharmacist  10/01/2021 7:52 AM   [facility] pharmacy phone numbers are listed on [email or web address]

## 2021-10-01 NOTE — Progress Notes (Signed)
Outpatient HF CSW came by to check on patient.  Per RN pt wife had just left and pt is resting at this time.  Will continue to follow and assist as needed during inpatient stay  Jorge Ny, [facility] Desk#: [phone] Cell#: [phone]

## 2021-10-01 NOTE — Progress Notes (Addendum)
Patient ID: Isaac [name], male   DOB: 08-Sep-1951, 71 y.o.   MRN: [id number]    Advanced Heart Failure VAD Team Note  PCP-Cardiologist: None   Subjective:    12/19 RHC- RA 7, PA 39/14 (25), PCWP 11, CO 6.4 CI 3. Thermo 3.5 1.8. Lasix drip stopped after cath. 12/20 Swan removed.  Norepi 3 mcg added.  12/21 Fever. Blood CX drawn. UA + leukocytes. Started on vanc and cefepime. Diuresed with IV lasix + metolazone.  12/23 Impella 5.5 placed 12/28 HM III LVAD, ICD leads plastered to tricuspid valve with severe TR, valve not replaced and some improvement in TR with LVAD placement.  12/29 Extubated 01/03 Started CRRT 01/07  L thoracentesis and placement of [facility] 01/09 LVAD speed increased to 5600 rpm 01/10 Milrinone stopped. Amio gtt>>PO. Completed 5 day course Cefepime.   Off Milrinone, Co-ox 57%. Hgb on Co-ox low at 6.9. STAT CBC ordered to confirm.   Hypotension limited CVVH removal yesterday. MAPs dropped to 50s. Started back on Epi, currently at 3. MAPs mid 84s currently.   Only 1.2L pulled yesterday. Urine output 32 cc. Wt up 2 lb. Still ~22 lb above preop wt. CVP 14  Currently off CVVH after filter clotted this am.   Feels nauseous w/ POs. No constipation, having BMs.  Denies CP and dyspnea.    LVAD INTERROGATION:  HeartMate III LVAD:   Flow 4.5 liters/min, speed 5600, power 4.2, PI 1.8. Numerous PI events.  VAD interrogated personally. Parameters stable.     Objective:    Vital Signs:   Temp:  [97.3 F (36.3 C)-99.1 F (37.3 C)] 98.1 F (36.7 C) (01/11 0500) Pulse Rate:  [29-146] 61 (01/11 0145) Resp:  [10-29] 11 (01/11 0500) SpO2:  [69 %-95 %] 93 % (01/11 0145) Arterial Line BP: (64-115)/(48-73) 110/68 (01/11 0500) Weight:  [84 kg] (P) 84 kg (01/11 0500) Last BM Date: 09/30/21 Mean arterial Pressure  Mid 80s   Intake/Output:   Intake/Output Summary (Last 24 hours) at 10/01/2021 0737 Last data filed at 10/01/2021 0511 Gross per 24 hour  Intake 950.21 ml   Output 1272 ml  Net -321.79 ml     Physical Exam   CVP 14  General:  sitting up in chair. No distress  HEENT: normal  Neck: supple. JVD to jaw  Carotids 2+ bilat; no bruits. No lymphadenopathy or thryomegaly appreciated. [facility] cath left upper chest  Cor: LVAD hum.  Lungs: clear bilaterally. No wheezing   Abdomen: obese soft, nontender, + distended.Hypoactive bowel sounds. Driveline site clean. Anchor in place.  Extremities: no cyanosis, clubbing, rash. 1+ b/l LE up to thighs  Neuro: alert & oriented x 3. No focal deficits. Moves all 4 without problem   Telemetry   AF 80-90s (personally reviewed)   Labs   Basic Metabolic Panel: Recent Labs  Lab 09/27/21 0348 09/27/21 1600 09/28/21 0341 09/28/21 1951 09/29/21 0311 09/29/21 1722 09/30/21 0317 09/30/21 1747 10/01/21 0345  NA 135   < > 132*   < > 133* 135 135 135 136  K 4.5   < > 4.5   < > 4.6 4.5 4.3 4.3 4.1  CL 102   < > 99   < > 99 102 103 103 103  CO2 27   < > 27   < > 27 25 25 24 24   GLUCOSE 170*   < > 124*   < > 122* 136* 84 82 101*  BUN 19   < > 17   < >  15 15 16 17 16   CREATININE 1.78*   < > 1.69*   < > 1.60* 1.69* 1.49* 1.68* 1.52*  CALCIUM 7.8*   < > 8.0*   < > 8.0* 8.1* 7.8* 7.9* 7.9*  MG 2.4  --  2.4  --  2.4  --  2.4  --  2.3  PHOS 2.2*   < > 2.0*   < > 1.8* 1.5* 2.4* 2.6 2.0*   < > = values in this interval not displayed.    Liver Function Tests: Recent Labs  Lab 09/27/21 0348 09/27/21 1600 09/29/21 0311 09/29/21 1722 09/30/21 0317 09/30/21 1747 10/01/21 0345  AST 35  --   --   --   --   --   --   ALT 6  --   --   --   --   --   --   ALKPHOS 170*  --   --   --   --   --   --   BILITOT 1.7*  --   --   --   --   --   --   PROT 5.7*  --   --   --   --   --   --   ALBUMIN 1.8*   1.8*   < > 1.7* 1.8* 1.7* 1.9* 1.8*   < > = values in this interval not displayed.   No results for input(s): LIPASE, AMYLASE in the last 168 hours. No results for input(s): AMMONIA in the last 168  hours.   CBC: Recent Labs  Lab 09/27/21 0348 09/28/21 0341 09/28/21 1951 09/29/21 0311 09/30/21 0941  WBC 13.5* 14.0* 14.2* 14.3* 14.1*  NEUTROABS  --   --  11.5*  --   --   HGB 8.6* 7.7* 7.7* 7.8* 8.2*  HCT 24.9* 23.5* 23.7* 23.0* 24.7*  MCV 84.4 84.8 86.5 85.8 86.7  PLT 496* 449* 475* 445* 519*    INR: Recent Labs  Lab 09/27/21 0348 09/28/21 0341 09/29/21 0311 09/30/21 0317 10/01/21 0345  INR 2.2* 2.4* 2.4* 2.5* 2.3*    Other results:   Imaging   No results found.   Medications:     Scheduled Medications:  (feeding supplement) PROSource Plus  30 mL Oral TID WC   sodium chloride   Intravenous Once   sodium chloride   Intravenous Once   acetaminophen  500 mg Oral Q6H   amiodarone  200 mg Oral BID   aspirin  81 mg Oral Daily   bisacodyl  10 mg Oral Daily   Or   bisacodyl  10 mg Rectal Daily   Chlorhexidine Gluconate Cloth  6 each Topical Daily   docusate sodium  200 mg Oral Daily   feeding supplement  237 mL Oral TID BM   mouth rinse  15 mL Mouth Rinse BID   metoCLOPramide (REGLAN) injection  5 mg Intravenous Q8H   midodrine  10 mg Oral TID WC   montelukast  5 mg Oral QHS   multivitamin  1 tablet Oral QHS   pantoprazole  40 mg Oral Daily   polyethylene glycol  17 g Oral BID   rosuvastatin  10 mg Oral Daily   sodium chloride flush  10-40 mL Intracatheter Q12H   sodium chloride flush  3 mL Intravenous Q12H   warfarin  1 mg Oral q1600   Warfarin - Pharmacist Dosing Inpatient   Does not apply q1600    Infusions:   prismasol BGK 4/2.5 400 mL/hr  at 10/01/21 0550    prismasol BGK 4/2.5 200 mL/hr at 09/30/21 1535   sodium chloride Stopped (09/20/21 0304)   sodium chloride Stopped (09/18/21 0803)   sodium chloride 20 mL/hr at 10/01/21 0500   epinephrine 0.5 mcg/min (10/01/21 0500)   lactated ringers     norepinephrine (LEVOPHED) Adult infusion Stopped (09/28/21 2218)   prismasol BGK 4/2.5 1,800 mL/hr at 10/01/21 0400   vasopressin      PRN  Medications: sodium chloride, sodium chloride, albuterol, heparin, heparin, lip balm, melatonin, morphine injection, ondansetron (ZOFRAN) IV, oxyCODONE, polyethylene glycol, sodium chloride flush, sodium chloride flush, sorbitol, traMADol   Assessment/Plan:    1. Acute on chronic systolic CHF:  Long-standing cardiomyopathy.  [place].  Echo this admission with EF < 20%, severe LV dilation, restrictive diastolic function, moderate RV dysfunction, moderate MR, mod-severe TR. Cause of cardiomyopathy is uncertain.  He has a history of CAD, but I do not think that the described CAD from the past could explain his cardiomyopathy, but CAD could have progressed.  With difficulty tolerating GDMT/need for midodrine and cardiorenal syndrome as well as profound volume overload + NYHA class IV symptoms,  concerned for low output HF. Co-ox off milrinone was 36%, milrinone started and increased to 0.375 but CO remained low. NE added and Impella 5.5 placed 12/23. EF 10% on TEE 12/23. s/p HM III VAD on 12/28.  Echo 01/03 with EF 20-25%, RV moderately reduced, septum mid-line.  Now off milrinone. Remains on Epi for BP support, currently at 3 mcg + midodrine 10 tid. Co-ox 57%. VAD interrogated personally. Parameters stable. Remains markedly fluid overloaded with weight up 22 pounds from pre-op - Main issue now is volume removal in setting of AKI/ESRD - Continue midodrine to keep MAP ideally >70 to promote renal perfusion/recovery. Increase to 15 tid to help wean off Epi  - Pull 50-100/hr. Adjust pressors to facilitate volume removal. Can add VP as needed.  - Warfarin for INR 2-2.5.  INR 2.3 today  - LDH 295 - Continue 81 mg aspirin - Continue UNNA boots 2. Tricuspid regurgitation: Has moderate-severe TR.  Tricuspid repair not done at time of VAD d/t proximity of ICD wires 3. Atrial fibrillation: Persistent since 10/22 based on device interrogation.  Was in junctional rhythm, remains in AF with HR 80-90s.    - Continue PO amio 200 bid  - INR therapeutic 4. AKI on CKD stage 3: Creatinine 2.61 => 3.1 => 3.33 => 4.27 => 5.24=>1.60=>1.49=>1.52 today.  Gradual rise (was about 1.7 pre-op) since surgery.  He has not been hypotensive post-op, suspect intra-op hypotension led to development of ATN => urine sediment looked like ATN per renal. Now on CVVH. TDC placed 1/7  - Maintain MAP and CO as above - Pulling for 50-100/hr UF.  - Nephrology following.   5. CAD: History of PCI to OM1 in 2007 and RCA in 2013.  No CP or ACs.  - Deferred cardiac cath due to AKI and plan for VAD - Continue Crestor.  6. ID: Had fever 12/21, PCT 1.03.  Blood cultures negative. ?Phlebitis at IV site. Post-op fever with elevated WBCs, PCT 2.63 -> 1.78 - Currently on cefepime. Vanc stopped.  Continue cefepime 7. Constipation: Continue aggressive bowel regimen. improved 8. Hyponatremia: Hypervolemic hyponatremia.  Controlled via CVVH.  9. Anemia: Hgb down to 7.7 -> 8.6 after 1u RBC on 1/6. Hgb low on co-ox this morning, 6.9. STAT CBC pending to confirm. No overt bleeding - continue to follow. Transfuse  hgb < 7.5 10. Confusion/delirium: Initially post-op and started on Precedex.  This has cleared, now up and has been walking halls 11. F/E/N: Advance diet. Ensure ordered. Encouraged PO intake. 12. Left pleural effusion: - s/p thoracentesis 1/7 13. Constipation. Resolved. BM 1/8   I reviewed the LVAD parameters from today, and compared the results to the patient's prior recorded data.  No programming changes were made.  The LVAD is functioning within specified parameters.  The patient performs LVAD self-test daily.  LVAD interrogation was negative for any significant power changes, alarms or PI events/speed drops.  LVAD equipment check completed and is in good working order.  Back-up equipment present.   LVAD education done on emergency procedures and precautions and reviewed exit site care.  Lyda Jester PA-C  10/01/2021 7:37  AM  Patient seen with PA, agree with the above note.    He had an episode of low BP overnight, was started back on epinephrine at 3. This morning, CVP 14 with co-ox 57%.  CVVH clotted, I/Os only about even yesterday.  Hgb low on co-ox, sending stat CBC.    No nausea today, getting ready for walk.    Device interrogation with PI events still with frequent PIs.    General: Well appearing this am. NAD.  HEENT: Normal. Neck: Supple, JVP 14 cm cm. Carotids OK.  Cardiac:  Mechanical heart sounds with LVAD hum present.  Lungs:  CTAB, normal effort.  Abdomen:  NT, ND, no HSM. No bruits or masses. +BS  LVAD exit site: Well-healed and incorporated. Dressing dry and intact. No erythema or drainage. Stabilization device present and accurately applied. Driveline dressing changed daily per sterile technique. Extremities:  Warm and dry. No cyanosis, clubbing, rash. 2+ edema to knees and elbows.  Neuro:  Alert & oriented x 3. Cranial nerves grossly intact. Moves all 4 extremities w/o difficulty. Affect pleasant    Still volume overloaded.  Continue CVVH, pull net negative 100 cc/hr UF today.    MAP 80s, wean off epinephrine today and increase midodrine to 15 tid. Arterial line out today.  When epinephrine off, plan ramp echo evaluation (likely tomorrow).    Continue po amiodarone for rate control of atrial fibrillation. Eventual DCCV to help RV function.    Continue to work with PT, walk in halls. Encourage nutrition.   CRITICAL CARE Performed by: Loralie Champagne  Total critical care time: 35 minutes  Critical care time was exclusive of separately billable procedures and treating other patients.  Critical care was necessary to treat or prevent imminent or life-threatening deterioration.  Critical care was time spent personally by me on the following activities: development of treatment plan with patient and/or surrogate as well as nursing, discussions with consultants, evaluation of patient's  response to treatment, examination of patient, obtaining history from patient or surrogate, ordering and performing treatments and interventions, ordering and review of laboratory studies, ordering and review of radiographic studies, pulse oximetry and re-evaluation of patient's condition.  Loralie Champagne 10/01/2021 8:35 AM

## 2021-10-02 ENCOUNTER — Inpatient Hospital Stay (HOSPITAL_COMMUNITY): Payer: Medicare HMO

## 2021-10-02 DIAGNOSIS — R570 Cardiogenic shock: Secondary | ICD-10-CM | POA: Diagnosis not present

## 2021-10-02 DIAGNOSIS — I5021 Acute systolic (congestive) heart failure: Secondary | ICD-10-CM

## 2021-10-02 DIAGNOSIS — J90 Pleural effusion, not elsewhere classified: Secondary | ICD-10-CM

## 2021-10-02 DIAGNOSIS — Z95811 Presence of heart assist device: Secondary | ICD-10-CM | POA: Diagnosis not present

## 2021-10-02 LAB — PREPARE RBC (CROSSMATCH)

## 2021-10-02 LAB — CULTURE, BODY FLUID W GRAM STAIN -BOTTLE: Culture: NO GROWTH

## 2021-10-02 LAB — GLUCOSE, CAPILLARY
Glucose-Capillary: 116 mg/dL — ABNORMAL HIGH (ref 70–99)
Glucose-Capillary: 138 mg/dL — ABNORMAL HIGH (ref 70–99)
Glucose-Capillary: 163 mg/dL — ABNORMAL HIGH (ref 70–99)
Glucose-Capillary: 206 mg/dL — ABNORMAL HIGH (ref 70–99)
Glucose-Capillary: 64 mg/dL — ABNORMAL LOW (ref 70–99)
Glucose-Capillary: 90 mg/dL (ref 70–99)
Glucose-Capillary: 94 mg/dL (ref 70–99)
Glucose-Capillary: 94 mg/dL (ref 70–99)

## 2021-10-02 LAB — RENAL FUNCTION PANEL
Albumin: 1.9 g/dL — ABNORMAL LOW (ref 3.5–5.0)
Albumin: 1.9 g/dL — ABNORMAL LOW (ref 3.5–5.0)
Anion gap: 10 (ref 5–15)
Anion gap: 7 (ref 5–15)
BUN: 15 mg/dL (ref 8–23)
BUN: 16 mg/dL (ref 8–23)
CO2: 25 mmol/L (ref 22–32)
CO2: 25 mmol/L (ref 22–32)
Calcium: 8.2 mg/dL — ABNORMAL LOW (ref 8.9–10.3)
Calcium: 7.8 mg/dL — ABNORMAL LOW (ref 8.9–10.3)
Chloride: 101 mmol/L (ref 98–111)
Chloride: 102 mmol/L (ref 98–111)
Creatinine, Ser: 1.46 mg/dL — ABNORMAL HIGH (ref 0.61–1.24)
Creatinine, Ser: 1.43 mg/dL — ABNORMAL HIGH (ref 0.61–1.24)
GFR, Estimated: 52 mL/min — ABNORMAL LOW (ref >60)
GFR, Estimated: 53 mL/min — ABNORMAL LOW (ref >60)
Glucose, Bld: 72 mg/dL (ref 70–99)
Glucose, Bld: 171 mg/dL — ABNORMAL HIGH (ref 70–99)
Phosphorus: 1.9 mg/dL — ABNORMAL LOW (ref 2.5–4.6)
Phosphorus: 1.7 mg/dL — ABNORMAL LOW (ref 2.5–4.6)
Potassium: 4.5 mmol/L (ref 3.5–5.1)
Potassium: 4.3 mmol/L (ref 3.5–5.1)
Sodium: 136 mmol/L (ref 135–145)
Sodium: 134 mmol/L — ABNORMAL LOW (ref 135–145)

## 2021-10-02 LAB — PROTIME-INR
INR: 2.2 — ABNORMAL HIGH (ref 0.8–1.2)
Prothrombin Time: 24.6 seconds — ABNORMAL HIGH (ref 11.4–15.2)

## 2021-10-02 LAB — BODY FLUID CELL COUNT WITH DIFFERENTIAL
Eos, Fluid: 1 %
Lymphs, Fluid: 10 %
Monocyte-Macrophage-Serous Fluid: 1 % — ABNORMAL LOW (ref 50–90)
Neutrophil Count, Fluid: 88 % — ABNORMAL HIGH (ref 0–25)
Total Nucleated Cell Count, Fluid: 1268 cu mm — ABNORMAL HIGH (ref 0–1000)

## 2021-10-02 LAB — CBC
HCT: 24.2 % — ABNORMAL LOW (ref 39.0–52.0)
Hemoglobin: 7.7 g/dL — ABNORMAL LOW (ref 13.0–17.0)
MCH: 28.7 pg (ref 26.0–34.0)
MCHC: 31.8 g/dL (ref 30.0–36.0)
MCV: 90.3 fL (ref 80.0–100.0)
Platelets: 576 10*3/uL — ABNORMAL HIGH (ref 150–400)
RBC: 2.68 MIL/uL — ABNORMAL LOW (ref 4.22–5.81)
RDW: 20.7 % — ABNORMAL HIGH (ref 11.5–15.5)
WBC: 13.2 10*3/uL — ABNORMAL HIGH (ref 4.0–10.5)
nRBC: 0 % (ref 0.0–0.2)

## 2021-10-02 LAB — COOXEMETRY PANEL
Carboxyhemoglobin: 1.9 % — ABNORMAL HIGH (ref 0.5–1.5)
Carboxyhemoglobin: 1.3 % (ref 0.5–1.5)
Carboxyhemoglobin: 2.1 % — ABNORMAL HIGH (ref 0.5–1.5)
Methemoglobin: 1 % (ref 0.0–1.5)
Methemoglobin: 0.9 % (ref 0.0–1.5)
Methemoglobin: 1.1 % (ref 0.0–1.5)
O2 Saturation: 37.3 %
O2 Saturation: 21.3 %
O2 Saturation: 41.3 %
Total hemoglobin: 7.9 g/dL — ABNORMAL LOW (ref 12.0–16.0)
Total hemoglobin: 8.2 g/dL — ABNORMAL LOW (ref 12.0–16.0)
Total hemoglobin: 8 g/dL — ABNORMAL LOW (ref 12.0–16.0)

## 2021-10-02 LAB — LACTATE DEHYDROGENASE: LDH: 314 U/L — ABNORMAL HIGH (ref 98–192)

## 2021-10-02 LAB — ECHOCARDIOGRAM LIMITED
Height: 69 in
Height: 69 in
Weight: 2836 oz
Weight: 2836 oz

## 2021-10-02 LAB — HEMOGLOBIN AND HEMATOCRIT, BLOOD
HCT: 25.5 % — ABNORMAL LOW (ref 39.0–52.0)
Hemoglobin: 8.2 g/dL — ABNORMAL LOW (ref 13.0–17.0)

## 2021-10-02 LAB — MAGNESIUM: Magnesium: 2.4 mg/dL (ref 1.7–2.4)

## 2021-10-02 MED ORDER — NOREPINEPHRINE 4 MG/250ML-% IV SOLN
0.0000 ug/min | INTRAVENOUS | Status: DC
  Administered 2021-10-02 – 2021-10-07 (×5): 3 ug/min via INTRAVENOUS
  Administered 2021-10-08: 2 ug/min via INTRAVENOUS
  Filled 2021-10-02 (×6): qty 250

## 2021-10-02 MED ORDER — DEXTROSE 50 % IV SOLN
12.5000 g | INTRAVENOUS | Status: AC
  Administered 2021-10-02: 12.5 g via INTRAVENOUS

## 2021-10-02 MED ORDER — NOREPINEPHRINE 4 MG/250ML-% IV SOLN
INTRAVENOUS | Status: AC
  Filled 2021-10-02: qty 250

## 2021-10-02 MED ORDER — SODIUM CHLORIDE 0.9% IV SOLUTION: Freq: Once | INTRAVENOUS | Status: DC

## 2021-10-02 MED ORDER — LORAZEPAM 2 MG/ML IJ SOLN
INTRAMUSCULAR | Status: AC
  Administered 2021-10-02: 0.5 mg via INTRAVENOUS
  Filled 2021-10-02: qty 1

## 2021-10-02 MED ORDER — SODIUM CHLORIDE 0.9% IV SOLUTION: Freq: Once | INTRAVENOUS | Status: AC

## 2021-10-02 MED ORDER — LORAZEPAM 2 MG/ML IJ SOLN: 0.5000 mg | Freq: Once | INTRAMUSCULAR | Status: AC

## 2021-10-02 MED ORDER — MILRINONE LACTATE IN DEXTROSE 20-5 MG/100ML-% IV SOLN
0.2500 ug/kg/min | INTRAVENOUS | Status: DC
  Administered 2021-10-02 – 2021-10-08 (×8): 0.25 ug/kg/min via INTRAVENOUS
  Filled 2021-10-02 (×10): qty 100

## 2021-10-02 NOTE — Progress Notes (Addendum)
Patient ID: Isaac [name], male   DOB: [birth year], 71 y.o.   MRN: [id number]    Advanced Heart Failure VAD Team Note  PCP-Cardiologist: None   Subjective:    12/19 RHC- RA 7, PA 39/14 (25), PCWP 11, CO 6.4 CI 3. Thermo 3.5 1.8. Lasix drip stopped after cath. 12/20 Swan removed.  Norepi 3 mcg added.  12/21 Fever. Blood CX drawn. UA + leukocytes. Started on vanc and cefepime. Diuresed with IV lasix + metolazone.  12/23 Impella 5.5 placed 12/28 HM III LVAD, ICD leads plastered to tricuspid valve with severe TR, valve not replaced and some improvement in TR with LVAD placement.  12/29 Extubated 01/03 Started CRRT 01/07  L thoracentesis and placement of [facility] 01/09 LVAD speed increased to 5600 rpm 01/10 Milrinone stopped. Amio gtt>>PO. Completed 5 day course Cefepime.   Feels bad today. "Wiped out"  Co-ox down, 37%, 21% on repeat.   Hgb 7.7.   Poor PO intake. Nauseated. No vomiting.   Remains on CVVH. Pull rate reduced overnight due to soft BPs. CVP 11-12   LDH 295>>314  LVAD INTERROGATION:  HeartMate III LVAD:   Flow 3.3 liters/min, speed 5600, power 4.0, PI 9.3. Numerous PI events.  VAD interrogated personally.      Objective:    Vital Signs:   Temp:  [97.3 F (36.3 C)-98.3 F (36.8 C)] 98.3 F (36.8 C) (01/11 2310) Pulse Rate:  [109-209] 142 (01/12 0600) Resp:  [12-31] 23 (01/12 0700) BP: (64-125)/(20-97) 117/74 (01/12 0600) SpO2:  [82 %-99 %] 90 % (01/12 0600) Arterial Line BP: (81)/(54) 81/54 (01/11 0800) Weight:  [80.4 kg] 80.4 kg (01/12 0630) Last BM Date: 10/01/21 Mean arterial Pressure  Mid 80s   Intake/Output:   Intake/Output Summary (Last 24 hours) at 10/02/2021 0746 Last data filed at 10/02/2021 0700 Gross per 24 hour  Intake 2183.16 ml  Output 4758 ml  Net -2574.84 ml     Physical Exam   CVP 11-12 General:  fatigued appearing, in bed. No respiratory difficulty  HEENT: normal  Neck: supple. JVD to jaw  Carotids 2+ bilat; no bruits. No  lymphadenopathy or thryomegaly appreciated. [facility] cath left upper chest  Cor: LVAD hum.  Lungs:  clear bilaterally  Abdomen: obese soft, nontender, + distended. Hypoactive bowel sounds. Driveline site clean. Anchor in place.  Extremities: no cyanosis, clubbing, rash. No edema, LEs cool  Neuro: alert & oriented x 3. No focal deficits. Moves all 4 without problem   Telemetry   AF 80-90s (personally reviewed)   Labs   Basic Metabolic Panel: Recent Labs  Lab 09/28/21 0341 09/28/21 1951 09/29/21 0311 09/29/21 1722 09/30/21 0317 09/30/21 1747 10/01/21 0345 10/01/21 1532 10/02/21 0415  NA 132*   < > 133*   < > 135 135 136 135 136  K 4.5   < > 4.6   < > 4.3 4.3 4.1 4.7 4.5  CL 99   < > 99   < > 103 103 103 102 101  CO2 27   < > 27   < > 25 24 24 25 25   GLUCOSE 124*   < > 122*   < > 84 82 101* 98 72  BUN 17   < > 15   < > 16 17 16 18 15   CREATININE 1.69*   < > 1.60*   < > 1.49* 1.68* 1.52* 1.50* 1.46*  CALCIUM 8.0*   < > 8.0*   < > 7.8* 7.9* 7.9* 8.0* 8.2*  MG  2.4  --  2.4  --  2.4  --  2.3  --  2.4  PHOS 2.0*   < > 1.8*   < > 2.4* 2.6 2.0* 2.3* 1.9*   < > = values in this interval not displayed.    Liver Function Tests: Recent Labs  Lab 09/27/21 0348 09/27/21 1600 09/30/21 0317 09/30/21 1747 10/01/21 0345 10/01/21 1532 10/02/21 0415  AST 35  --   --   --   --   --   --   ALT 6  --   --   --   --   --   --   ALKPHOS 170*  --   --   --   --   --   --   BILITOT 1.7*  --   --   --   --   --   --   PROT 5.7*  --   --   --   --   --   --   ALBUMIN 1.8*   1.8*   < > 1.7* 1.9* 1.8* 1.8* 1.9*   < > = values in this interval not displayed.   No results for input(s): LIPASE, AMYLASE in the last 168 hours. No results for input(s): AMMONIA in the last 168 hours.   CBC: Recent Labs  Lab 09/28/21 1951 09/29/21 0311 09/30/21 0941 10/01/21 0927 10/02/21 0415  WBC 14.2* 14.3* 14.1* 13.2* 13.2*  NEUTROABS 11.5*  --   --   --   --   HGB 7.7* 7.8* 8.2* 7.8* 7.7*  HCT 23.7*  23.0* 24.7* 23.8* 24.2*  MCV 86.5 85.8 86.7 87.5 90.3  PLT 475* 445* 519* 541* 576*    INR: Recent Labs  Lab 09/28/21 0341 09/29/21 0311 09/30/21 0317 10/01/21 0345 10/02/21 0415  INR 2.4* 2.4* 2.5* 2.3* 2.2*    Other results:   Imaging   No results found.   Medications:     Scheduled Medications:  (feeding supplement) PROSource Plus  30 mL Oral TID WC   sodium chloride   Intravenous Once   sodium chloride   Intravenous Once   acetaminophen  500 mg Oral Q6H   amiodarone  200 mg Oral BID   aspirin  81 mg Oral Daily   Chlorhexidine Gluconate Cloth  6 each Topical Daily   docusate sodium  200 mg Oral Daily   feeding supplement  237 mL Oral TID BM   feeding supplement ([employer] STANDARD 1.4)  325 mL Oral TID BM   mouth rinse  15 mL Mouth Rinse BID   metoCLOPramide (REGLAN) injection  5 mg Intravenous Q8H   midodrine  15 mg Oral TID WC   montelukast  5 mg Oral QHS   multivitamin  1 tablet Oral QHS   pantoprazole  40 mg Oral Daily   polyethylene glycol  17 g Oral Daily   rosuvastatin  10 mg Oral Daily   sodium chloride flush  10-40 mL Intracatheter Q12H   sodium chloride flush  3 mL Intravenous Q12H   warfarin  1 mg Oral q1600   Warfarin - Pharmacist Dosing Inpatient   Does not apply q1600    Infusions:   prismasol BGK 4/2.5 400 mL/hr at 10/02/21 0345    prismasol BGK 4/2.5 200 mL/hr at 10/01/21 1758   sodium chloride Stopped (09/20/21 0304)   sodium chloride Stopped (09/18/21 0803)   sodium chloride 10 mL/hr at 10/02/21 0700   epinephrine Stopped (10/01/21 1017)   lactated  ringers     milrinone     prismasol BGK 4/2.5 1,800 mL/hr at 10/02/21 0728   promethazine (PHENERGAN) injection (IM or IVPB) Stopped (10/01/21 1714)    PRN Medications: sodium chloride, sodium chloride, albuterol, heparin, heparin, lip balm, melatonin, morphine injection, ondansetron (ZOFRAN) IV, oxyCODONE, polyethylene glycol, promethazine (PHENERGAN) injection (IM or IVPB), sodium  chloride flush, sodium chloride flush, traMADol   Assessment/Plan:    1. Acute on chronic systolic CHF:  Long-standing cardiomyopathy.  [place].  Echo this admission with EF < 20%, severe LV dilation, restrictive diastolic function, moderate RV dysfunction, moderate MR, mod-severe TR. Cause of cardiomyopathy is uncertain.  He has a history of CAD, but I do not think that the described CAD from the past could explain his cardiomyopathy, but CAD could have progressed.  With difficulty tolerating GDMT/need for midodrine and cardiorenal syndrome as well as profound volume overload + NYHA class IV symptoms,  concerned for low output HF. Co-ox off milrinone was 36%, milrinone started and increased to 0.375 but CO remained low. NE added and Impella 5.5 placed 12/23. EF 10% on TEE 12/23. s/p HM III VAD on 12/28.  Echo 01/03 with EF 20-25%, RV moderately reduced, septum mid-line.  Now off milrinone and Epi. Co-ox low, 37%, 21% on repeat. Feels bad today. Concern RV is struggling  - Restart milrinone 0.25 mcg/kg/min - Plan echo today  - On CVVH for volume removal in setting of AKI/ESRD - Continue midodrine 15 tid to keep MAP ideally >70 to promote renal perfusion/recovery.  - Pull 50-100/hr. Adjust pressors to facilitate volume removal. Can add VP as needed.  - Warfarin for INR 2-2.5.  INR 2.2 today  - LDH 314 - Continue 81 mg aspirin - Continue UNNA boots 2. Tricuspid regurgitation: Has moderate-severe TR.  Tricuspid repair not done at time of VAD d/t proximity of ICD wires 3. Atrial fibrillation: Persistent since 10/22 based on device interrogation.  Was in junctional rhythm, remains in AF with HR 80-90s.   - Continue PO amio 200 bid  - INR therapeutic 4. AKI on CKD stage 3: Creatinine 2.61 => 3.1 => 3.33 => 4.27 => 5.24=>1.60=>1.49=>1.52 +1.42 today.  Gradual rise (was about 1.7 pre-op) since surgery.  He has not been hypotensive post-op, suspect intra-op hypotension led to development of  ATN => urine sediment looked like ATN per renal. Now on CVVH. TDC placed 1/7  - Maintain MAP and CO as above - Pulling for 50-100/hr UF.  - Nephrology following.   5. CAD: History of PCI to OM1 in 2007 and RCA in 2013.  No CP or ACs.  - Deferred cardiac cath due to AKI and plan for VAD - Continue Crestor.  6. ID: Had fever 12/21, PCT 1.03.  Blood cultures negative. ?Phlebitis at IV site. Post-op fever with elevated WBCs, PCT 2.63 -> 1.78 - Currently on cefepime. Vanc stopped.  Continue cefepime 7. Constipation: Continue aggressive bowel regimen. improved 8. Hyponatremia: Hypervolemic hyponatremia.  Controlled via CVVH.  9. Anemia: Hgb down to 7.7 -> 8.6 after 1u RBC on 1/6. Hgb back down to 7.7 today. No overt bleeding - transfuse x 1 u RBCs today  10. Confusion/delirium: Initially post-op and started on Precedex.  This has cleared, now up and has been walking halls 11. F/E/N: Advance diet. Ensure ordered. Encouraged PO intake. Recommend placement of cortrak for TFs  12. Left pleural effusion: - s/p thoracentesis 1/7 13. Constipation. Resolved. BM 1/8   I reviewed the LVAD parameters from  today, and compared the results to the patient's prior recorded data.  No programming changes were made.  The LVAD is functioning within specified parameters.  The patient performs LVAD self-test daily.  LVAD interrogation was negative for any significant power changes, alarms or PI events/speed drops.  LVAD equipment check completed and is in good working order.  Back-up equipment present.   LVAD education done on emergency procedures and precautions and reviewed exit site care.  Lyda Jester PA-C  10/02/2021 7:46 AM  Patient seen with PA, agree with the above note.    Patient is feeling significantly worse, nauseated.  Flow down to 3.5 with PI higher.   Co-ox 37%, repeated 21%.  He is off pressors and inotropes.  CVP 11, weight down with CVVH.  Hgb 7.7.  MAP 64 when I measured.  No fever, WBCs  slowly trending down.    General: Well appearing this am. NAD.  HEENT: Normal. Neck: Supple, JVP 12 cm. Carotids OK.  Cardiac:  Mechanical heart sounds with LVAD hum present.  Lungs:  CTAB, normal effort.  Abdomen:  NT, ND, no HSM. No bruits or masses. +BS  LVAD exit site: Well-healed and incorporated. Dressing dry and intact. No erythema or drainage. Stabilization device present and accurately applied. Driveline dressing changed daily per sterile technique. Extremities:  Warm and dry. No cyanosis, clubbing, rash.  2+ edema to knees and elbows.  Neuro:  Alert & oriented x 3. Cranial nerves grossly intact. Moves all 4 extremities w/o difficulty. Affect pleasant    Still volume overloaded but weight coming down.  CVVH, pulling 50-100 cc/hr as able.   Cardiac output and MAP low today, significant change from yesterday.  Flow lower and PI higher.  Concern for RV failure, uncertain why acutely worse (?localized tamponade).  LDH stable and INR therapeutic.  - Add back milrinone 0.25 and NE 3.  - Will get echo this morning, suspect will need to decrease speed for RV.  - Continue midodrine 15 tid.     Hgb 7.7, will give 1 unit PRBCs.    Rhythm looks a-paced, will get ECG.  He remains on po amiodarone.    Inadequate nutrition.  We discussed Cortrack, he agrees to placing.   Discussed the above with Dr. Cyndia Bent.   CRITICAL CARE Performed by: Loralie Champagne  Total critical care time: 45 minutes  Critical care time was exclusive of separately billable procedures and treating other patients.  Critical care was necessary to treat or prevent imminent or life-threatening deterioration.  Critical care was time spent personally by me on the following activities: development of treatment plan with patient and/or surrogate as well as nursing, discussions with consultants, evaluation of patient's response to treatment, examination of patient, obtaining history from patient or surrogate, ordering and  performing treatments and interventions, ordering and review of laboratory studies, ordering and review of radiographic studies, pulse oximetry and re-evaluation of patient's condition.  Loralie Champagne 10/02/2021 8:23 AM

## 2021-10-02 NOTE — Progress Notes (Signed)
Patient ID: Isaac [name], male   DOB: [birth year], 71 y.o.   MRN: [id number]  Echo done today, see charted report.  There is a larger pericardial effusion, now moderate and lateral to the left ventricle.  There is no evidence for tamponade.  The RV is larger, now severely dilated with moderate dysfunction.  The LV is small and underfilled with leftward shifted septum.  Speed was gradually decreased on LVAD today in 2 sessions under echo guidance due to RV dysfunction and small, underfilled LV.  Speed initially was 5600 rpm, decreased down to 5000 rpm.  The LV appeared larger with this change.  The aortic valve still does not open.    Patient feels better this afternoon, MAP is higher.  Have been able to start pulling volume off again by CVVH.  Thoracentesis was done this afternoon by CCM on left, 1.3 L off.   CRITICAL CARE Performed by: Loralie Champagne  Total critical care time: 50 minutes  Critical care time was exclusive of separately billable procedures and treating other patients.  Critical care was necessary to treat or prevent imminent or life-threatening deterioration.  Critical care was time spent personally by me on the following activities: development of treatment plan with patient and/or surrogate as well as nursing, discussions with consultants, evaluation of patient's response to treatment, examination of patient, obtaining history from patient or surrogate, ordering and performing treatments and interventions, ordering and review of laboratory studies, ordering and review of radiographic studies, pulse oximetry and re-evaluation of patient's condition.  Loralie Champagne 10/02/2021 4:39 PM

## 2021-10-02 NOTE — Progress Notes (Signed)
°  Echocardiogram 2D Echocardiogram has been performed.  Isaac [name] 10/02/2021, 2:48 PM

## 2021-10-02 NOTE — Progress Notes (Signed)
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 Acute component either from cardiorenal +/- ATN with decreased renal function from hemodynamics and CPB + hypotensive episodes + pressors.  - CRRT started on 1/3.        - UF backed off overnight d/t low pressures      - back on NE and milrinone          -Cont monitoring daily I/Os + weights  -Maintain MAP>65 for optimal renal perfusion.  -Avoid nephrotoxic medications including NSAIDs, contrast. - Dose medications for a GFR <15 ml/min and also on CRRT.   Cardiogenic shock: s/p LVAD 09/17/22 (HM 3).  Co-ox is lower this AM.   - TTE to assess for RV failure - on NE and milrinone again  CAD with h/o PCI last in 2013 Anemia Afib- on amio Dispo: ICU  Subjective:    Feels terrible this AM, Co-=ox down to 37-->21%.  Going to get echo.  Back on milrinone and NE.     Objective:   BP (!) 84/74 (BP Location: Left Arm)    Pulse (!) 142    Temp (!) 97 F (36.1 C) (Axillary)    Resp (!) 29    Ht 5\' 9"  (1.753 m)    Wt 80.4 kg Comment: weighed with pt plus system controller on neck sling   SpO2 90%    BMI 26.18 kg/m   Intake/Output Summary (Last 24 hours) at 10/02/2021 7902 Last data filed at 10/02/2021 0900 Gross per 24 hour  Intake 1570.31 ml  Output 5191 ml  Net -3620.69 ml   Weight change: -3.6 kg  Physical Exam: GEN: NAD, A&Ox3 appears uncomfortable HEENT: wearing glasses NECK: + JVD to angle of mandible LUNGS: CTA B/L no rales, rhonchi or wheezing CV: LVAD hum ABD: + bowel sounds EXT: 3+ lower extremity edema and also present in the UE, worsening over the past 24 hrs ACCESS: rt IJ TC   Imaging: No results found.  Labs: BMET Recent Labs  Lab 09/29/21 0311 09/29/21 1722 09/30/21 0317 09/30/21 1747 10/01/21 0345 10/01/21 1532 10/02/21 0415  NA 133* 135 135 135 136 135 136  K 4.6 4.5 4.3 4.3 4.1 4.7 4.5  CL 99 102 103 103 103  102 101  CO2 27 25 25 24 24 25 25   GLUCOSE 122* 136* 84 82 101* 98 72  BUN 15 15 16 17 16 18 15   CREATININE 1.60* 1.69* 1.49* 1.68* 1.52* 1.50* 1.46*  CALCIUM 8.0* 8.1* 7.8* 7.9* 7.9* 8.0* 8.2*  PHOS 1.8* 1.5* 2.4* 2.6 2.0* 2.3* 1.9*   CBC Recent Labs  Lab 09/28/21 1951 09/29/21 0311 09/30/21 0941 10/01/21 0927 10/02/21 0415  WBC 14.2* 14.3* 14.1* 13.2* 13.2*  NEUTROABS 11.5*  --   --   --   --   HGB 7.7* 7.8* 8.2* 7.8* 7.7*  HCT 23.7* 23.0* 24.7* 23.8* 24.2*  MCV 86.5 85.8 86.7 87.5 90.3  PLT 475* 445* 519* 541* 576*    Medications:     (feeding supplement) PROSource Plus  30 mL Oral TID WC   sodium chloride   Intravenous Once   sodium chloride   Intravenous Once   sodium chloride   Intravenous Once   sodium chloride   Intravenous Once   acetaminophen  500 mg Oral Q6H   amiodarone  200 mg Oral BID   aspirin  81 mg Oral Daily  Chlorhexidine Gluconate Cloth  6 each Topical Daily   docusate sodium  200 mg Oral Daily   feeding supplement  237 mL Oral TID BM   feeding supplement (KATE FARMS STANDARD 1.4)  325 mL Oral TID BM   mouth rinse  15 mL Mouth Rinse BID   metoCLOPramide (REGLAN) injection  5 mg Intravenous Q8H   midodrine  15 mg Oral TID WC   montelukast  5 mg Oral QHS   multivitamin  1 tablet Oral QHS   pantoprazole  40 mg Oral Daily   polyethylene glycol  17 g Oral Daily   rosuvastatin  10 mg Oral Daily   sodium chloride flush  10-40 mL Intracatheter Q12H   sodium chloride flush  3 mL Intravenous Q12H   warfarin  1 mg Oral q1600   Warfarin - Pharmacist Dosing Inpatient   Does not apply q1600      Madelon Lips, MD 10/02/2021, 9:58 AM

## 2021-10-02 NOTE — Progress Notes (Signed)
Hypoglycemic Event  CBG: 64  Treatment: D50 25 mL (12.5 gm)   Symptoms: None  Follow-up CBG: Time: 0442 CBG Result: 90  Possible Reasons for Event: Inadequate meal intake  Comments/MD notified: Will pass on to day team in [place]

## 2021-10-02 NOTE — Progress Notes (Addendum)
Speed  Flow  PI  Power  LVIDD  AI  Aortic opening MR  TR  Septum  RV   5100 4.1 4.3 3.5w 3.42 mild 0/0 none severe Left shift Mod HK   5000  4.0 4.3 3.3 3.4 mild 1/5 none severe left Mod HK                                                       Auto cuff BP: 91/80 (86)  Sinus rhythm - rate 65  CVVH ongoing; pulling 100 cc/hr  Milrinone 0.25 mcg/kg/min Levo 3 mcg/min   Ramp ECHO performed at bedside per Dr. Aundra Dubin.  Dr. Cyndia Bent updated with change above.   At completion of ramp study, patients primary controller programmed:  Fixed speed:  5000 Low speed limit: [place], VAD Coordinator 24/7 pager [phone]

## 2021-10-02 NOTE — Plan of Care (Signed)
°  Problem: Health Behavior/Discharge Planning: Goal: Ability to manage health-related needs will improve Outcome: Progressing   Problem: Clinical Measurements: Goal: Ability to maintain clinical measurements within normal limits will improve Outcome: Progressing Goal: Will remain free from infection Outcome: Progressing Goal: Diagnostic test results will improve Outcome: Progressing Goal: Respiratory complications will improve Outcome: Progressing Goal: Cardiovascular complication will be avoided Outcome: Progressing   Problem: Activity: Goal: Risk for activity intolerance will decrease Outcome: Progressing   Problem: Nutrition: Goal: Adequate nutrition will be maintained Outcome: Progressing   Problem: Coping: Goal: Level of anxiety will decrease Outcome: Progressing   Problem: Elimination: Goal: Will not experience complications related to bowel motility Outcome: Progressing Goal: Will not experience complications related to urinary retention Outcome: Progressing   Problem: Pain Managment: Goal: General experience of comfort will improve Outcome: Progressing   Problem: Safety: Goal: Ability to remain free from injury will improve Outcome: Progressing   Problem: Skin Integrity: Goal: Risk for impaired skin integrity will decrease Outcome: Progressing   Problem: Education: Goal: Ability to demonstrate management of disease process will improve Outcome: Progressing Goal: Ability to verbalize understanding of medication therapies will improve Outcome: Progressing Goal: Individualized Educational Video(s) Outcome: Progressing   Problem: Activity: Goal: Capacity to carry out activities will improve Outcome: Progressing   Problem: Cardiac: Goal: Ability to achieve and maintain adequate cardiopulmonary perfusion will improve Outcome: Progressing   Problem: Education: Goal: Knowledge of the prescribed therapeutic regimen will improve Outcome: Progressing    Problem: Activity: Goal: Risk for activity intolerance will decrease Outcome: Progressing   Problem: Cardiac: Goal: Ability to maintain an adequate cardiac output will improve Outcome: Progressing   Problem: Coping: Goal: Level of anxiety will decrease Outcome: Progressing   Problem: Fluid Volume: Goal: Risk for excess fluid volume will decrease Outcome: Progressing   Problem: Clinical Measurements: Goal: Ability to maintain clinical measurements within normal limits will improve Outcome: Progressing Goal: Will remain free from infection Outcome: Progressing   Problem: Respiratory: Goal: Will regain and/or maintain adequate ventilation Outcome: Progressing

## 2021-10-02 NOTE — Progress Notes (Signed)
LVAD Coordinator Rounding Note:  Admitted 09/06/21 due to CHF exacerbation to Dr Claris Gladden service. He was started on Milrinone on admission. Febrile n 09/10/21- suspected to have UTI- antibiotics started. Impella 5.5 placed 09/12/21.   HM III LVAD implanted on 09/17/21 by Dr Cyndia Bent under destination therapy criteria. 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   Pt lying in bed, "not feeling well" this am. Dr. Aundra Dubin at bedside, ramp echo completed. LVAD speed decreased from 5600 to 5100. Dr. Aundra Dubin spoke with Dr. Cyndia Bent about echo findings. Planned repeat echo this afternoon.   CRRT down to pulling 50 cc/hr based on Co-ox and BP, will try to increase now that BP stable per Dr. Aundra Dubin. BS nurse verbalized understanding of same.     Vital signs: Temp: 98.6 HR: 80 - 90 afib Doppler Pressure: 64 Radial Art Line: 84/74 (80) O2 Sat: 96% on 4L [place] Wt: 185.2>196.8>194>189.1>192.2>198.4>189.6>188.7>183.6>177 lbs    LVAD interrogation reveals:  Speed: 5600 Flow: 4.6 Power: 4.2w PI: 3.7 Hematocrit: 22  Alarms: none Events:  >100 PI events today  Fixed speed: 5600 Low speed limit: 5300  Drive Line: Existing VAD dressing C/D/I with anchor intact and accurately applied. M/W/F dressing changes per Nurse Davonna Belling, [place] coordinator, or trained caregiver.  Next dressing change due: 10/03/21.    Labs:  LDH trend: 344>379>370>378>380>331>296>281>291>293>295  INR trend: 1.6>1.8>2.8>2.9>2.1>2.0>2.2>2.4>2.4>2.5>2.3  WBC trend: 14.1>13.6>13.1>11>8.7>10.1>13.5>14>14.2>14.3>13.2  Cr trend: 2.11>2.39>5.24>3.45>2.38>1.97>1.87>1.68>1.73>1.60>1.49>1.52  Hgb trend: 6.5>7.5>8.1>8.8>7.7>8.6>7.7>7.8  Anticoagulation Plan: -INR Goal: 2.0 - 2.5 -ASA Dose: 81 mg daily   Blood Products:  Intra Op: 09/17/21  1 PLT  4 FFP   4 PRBC  Cellsaver- 803 cc Post Op:  - 09/17/21>>1 PRBC - 09/18/21>> 3 PRBCs - 09/19/21>> 1 PRBC -   09/26/21>> 1 PRBC - 10/02/21>>1 PRBC  Device: - [employer] dual -Therapies: OFF  *Will need to be referred to device clinic at discharge*  Arrythmias: 3 episodes of VT that required defib while in OR 09/17/21. Currently on Amiodarone gtt. Remains in afib.   Respiratory: extubated 09/18/22  Nitric Oxide:  off 09/18/22  Renal:  09/23/21>>started CVVH  Infection:  09/10/21>> blood cultures >> No growth 5 days; final 09/12/21>> urine culture >> No growth; final  09/25/21>>blood cultures>>negative  09/27/21>>pleural effusion>>negative, final  Drips:  Milrinone 0.25 mcg/kg/min Levo 3 mcg/min   Patient Education: No family at bedside. Patient not feeling well; inappropriate for teaching this am.   Plan/Recommendations:  1. Page VAD coordinator for drive line or equipment concerns 2. Daily drive line dressing change per Nurse Davonna Belling or VAD coordinator   Zada Girt RN [place] Coordinator  Office: [phone]  24/7 Pager: [phone]

## 2021-10-02 NOTE — Progress Notes (Signed)
Consulted for thora on the left seen on echo with increased SOB today.   Bedside US with moderate effusion.   Planning for bedside US guided thora when he is more awake this afternoon.  Julian Hy, DO 10/02/21 11:37 AM [facility]

## 2021-10-02 NOTE — Progress Notes (Signed)
UNNA boots taken down by this RN per order. Day RN to page ortho tech when ready for reapplication.

## 2021-10-02 NOTE — Progress Notes (Signed)
Patient ID: Isaac [name], male   DOB: 1951-05-14, 71 y.o.   MRN: [id number] HeartMate 3 Rounding Note  Subjective:    Doesn't feel well at all this morning with nausea and very fatigued. Co-ox this am low at 37.2 with repeat 21.3. Yesterday am was 57. Lower flows and high PI this am.  CVP 11 per DM. MAP 64 by dopper, 80 by cuff.  -2574 cc/ 24 hrs.   LVAD INTERROGATION:  HeartMate IIl LVAD:  Flow 3.4 liters/min, speed 5600, power 4, PI 9.6.     Objective:    Vital Signs:   Temp:  [97.3 F (36.3 C)-98.3 F (36.8 C)] 98.3 F (36.8 C) (01/11 2310) Pulse Rate:  [109-209] 142 (01/12 0600) Resp:  [12-31] 23 (01/12 0700) BP: (64-125)/(20-97) 117/74 (01/12 0600) SpO2:  [82 %-99 %] 90 % (01/12 0600) Weight:  [80.4 kg] 80.4 kg (01/12 0630) Last BM Date: 10/01/21   Intake/Output:   Intake/Output Summary (Last 24 hours) at 10/02/2021 0820 Last data filed at 10/02/2021 0700 Gross per 24 hour  Intake 2183.16 ml  Output 4758 ml  Net -2574.84 ml     Physical Exam: General:  Looks weak. No resp difficulty HEENT: normal Cor: distant heart sounds with LVAD hum present. Lungs: clear Abdomen: soft, nontender, non-distended.  Good bowel sounds. Extremities: moderate diffuse edema Neuro: lethargic but orientedx3, moves all 4 extremities w/o difficulty.   Telemetry: sinus with V pacing.  Labs: Basic Metabolic Panel: Recent Labs  Lab 09/28/21 0341 09/28/21 1951 09/29/21 0311 09/29/21 1722 09/30/21 0317 09/30/21 1747 10/01/21 0345 10/01/21 1532 10/02/21 0415  NA 132*   < > 133*   < > 135 135 136 135 136  K 4.5   < > 4.6   < > 4.3 4.3 4.1 4.7 4.5  CL 99   < > 99   < > 103 103 103 102 101  CO2 27   < > 27   < > 25 24 24 25 25   GLUCOSE 124*   < > 122*   < > 84 82 101* 98 72  BUN 17   < > 15   < > 16 17 16 18 15   CREATININE 1.69*   < > 1.60*   < > 1.49* 1.68* 1.52* 1.50* 1.46*  CALCIUM 8.0*   < > 8.0*   < > 7.8* 7.9* 7.9* 8.0* 8.2*  MG 2.4  --  2.4  --  2.4  --  2.3  --  2.4   PHOS 2.0*   < > 1.8*   < > 2.4* 2.6 2.0* 2.3* 1.9*   < > = values in this interval not displayed.    Liver Function Tests: Recent Labs  Lab 09/27/21 0348 09/27/21 1600 09/30/21 0317 09/30/21 1747 10/01/21 0345 10/01/21 1532 10/02/21 0415  AST 35  --   --   --   --   --   --   ALT 6  --   --   --   --   --   --   ALKPHOS 170*  --   --   --   --   --   --   BILITOT 1.7*  --   --   --   --   --   --   PROT 5.7*  --   --   --   --   --   --   ALBUMIN 1.8*   1.8*   < > 1.7* 1.9* 1.8*  1.8* 1.9*   < > = values in this interval not displayed.   No results for input(s): LIPASE, AMYLASE in the last 168 hours. No results for input(s): AMMONIA in the last 168 hours.  CBC: Recent Labs  Lab 09/28/21 1951 09/29/21 0311 09/30/21 0941 10/01/21 0927 10/02/21 0415  WBC 14.2* 14.3* 14.1* 13.2* 13.2*  NEUTROABS 11.5*  --   --   --   --   HGB 7.7* 7.8* 8.2* 7.8* 7.7*  HCT 23.7* 23.0* 24.7* 23.8* 24.2*  MCV 86.5 85.8 86.7 87.5 90.3  PLT 475* 445* 519* 541* 576*    INR: Recent Labs  Lab 09/28/21 0341 09/29/21 0311 09/30/21 0317 10/01/21 0345 10/02/21 0415  INR 2.4* 2.4* 2.5* 2.3* 2.2*    Other results: EKG:   Imaging: No results found.   Medications:     Scheduled Medications:  (feeding supplement) PROSource Plus  30 mL Oral TID WC   sodium chloride   Intravenous Once   sodium chloride   Intravenous Once   sodium chloride   Intravenous Once   sodium chloride   Intravenous Once   acetaminophen  500 mg Oral Q6H   amiodarone  200 mg Oral BID   aspirin  81 mg Oral Daily   Chlorhexidine Gluconate Cloth  6 each Topical Daily   docusate sodium  200 mg Oral Daily   feeding supplement  237 mL Oral TID BM   feeding supplement (KATE FARMS STANDARD 1.4)  325 mL Oral TID BM   mouth rinse  15 mL Mouth Rinse BID   metoCLOPramide (REGLAN) injection  5 mg Intravenous Q8H   midodrine  15 mg Oral TID WC   montelukast  5 mg Oral QHS   multivitamin  1 tablet Oral QHS    pantoprazole  40 mg Oral Daily   polyethylene glycol  17 g Oral Daily   rosuvastatin  10 mg Oral Daily   sodium chloride flush  10-40 mL Intracatheter Q12H   sodium chloride flush  3 mL Intravenous Q12H   warfarin  1 mg Oral q1600   Warfarin - Pharmacist Dosing Inpatient   Does not apply q1600    Infusions:   prismasol BGK 4/2.5 400 mL/hr at 10/02/21 0345    prismasol BGK 4/2.5 200 mL/hr at 10/01/21 1758   sodium chloride Stopped (09/20/21 0304)   sodium chloride Stopped (09/18/21 0803)   sodium chloride 10 mL/hr at 10/02/21 0700   epinephrine Stopped (10/01/21 1017)   lactated ringers     milrinone 0.25 mcg/kg/min (10/02/21 0802)   norepinephrine     norepinephrine (LEVOPHED) Adult infusion 3 mcg/min (10/02/21 0819)   prismasol BGK 4/2.5 1,800 mL/hr at 10/02/21 0728   promethazine (PHENERGAN) injection (IM or IVPB) Stopped (10/01/21 1714)    PRN Medications: sodium chloride, sodium chloride, albuterol, heparin, heparin, lip balm, melatonin, morphine injection, ondansetron (ZOFRAN) IV, oxyCODONE, polyethylene glycol, promethazine (PHENERGAN) injection (IM or IVPB), sodium chloride flush, sodium chloride flush, traMADol   Assessment:   POD 14 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 Moderate RV systolic dysfunction by echo preop but looked better after LVAD insertion in OR. This am he feels poorly with low Co-ox, lower flow from pump with high PI. Power stable. MAP on the low side. CVP 11 and Hgb 7.8, minimally down from where it has been. He has had no fever and WBC stable at 13. I suspect this  is due to RV decompensation with his baseline RV dysfunction.  2.   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 Decided against removal of ICD/pacer and TV replacement. Improved a little at the end of surgery. 3.  Hx of atrial fib on amio. Has  been in and out postop. 4.   Stage 3 CKD with postop ATN and anuric renal failure.  CRRT started for volume removal.  5.   CAD with PCI of OM1 in 2007 and RCA in 2013. 6.   Acute postop blood loss anemia. stable 7.   Left pleural effusion and LLL compressive atelectasis. s/p left thoracentesis. 8.   Low grade fevers and elevated procalcitonin with WBC trending up. BC and UC negative.  Completed  empiric antibiotics.  Plan/Discussion:    Starting milrinone and NE per DM. Check echo this morning. Transfuse PRBC's. Once stabilized with inotrope support may need to decrease LVAD speed a little in case it is affecting septal position and overloading RV. I doubt tamponade this far out but echo will assess for that.   Nutrition has been an issue for him with severe malnutrition and only meeting 25% of calorie needs and 20% of protein needs. He would benefit from Cortrak and TF but has refused so far.  INR therapeutic at 2.2 on 1 mg Coumadin.   I reviewed the LVAD parameters from today, and compared the results to the patient's prior recorded data.  No programming changes were made. Lower flow and high PI with low Co-ox. Further workup and interventions initiated. Length of Stay: [address]  Fernande Boyden [facility] 10/02/2021, 8:20 AM

## 2021-10-02 NOTE — Progress Notes (Signed)
Speed  Flow  PI  Power  LVIDD  AI  Aortic opening MR  TR  Septum  RV   5600 4.4 5.8 4.1w 2.38 trace 0/5 none s Left shift Mod HK   5200  4.0 7.3 3.8 2.38 trace 0/5 none     5100  4.4 3.6 3.5 2.4 trace 0/0 none severe midline                                            Auto cuff BP: 114/80 (89)  Afib - rate 80 - 90  Milrinone 0.25 mcg/kg/min Levi 3 mcg/min   Ramp ECHO performed at bedside per Dr. Aundra Dubin.  At completion of ramp study, patients primary controller programmed:  Fixed speed: 5100 Low speed limit: [place], VAD Coordinator 24/7 pager [phone]

## 2021-10-02 NOTE — Progress Notes (Signed)
OT Cancellation Note  Patient Details Name: Isaac [name] MRN: [id number] DOB: [birth year]   Cancelled Treatment:    Reason Eval/Treat Not Completed: Patient at procedure or test/ unavailable;Medical issues which prohibited therapy (ECHO.) Per RN, nauseous and requiring pressors. Will hold. Return as schedule allows.   [place], OTR/L Acute Rehab Pager: [phone] Office: [phone] 10/02/2021, 9:56 AM

## 2021-10-02 NOTE — Progress Notes (Signed)
°  Echocardiogram 2D Echocardiogram has been performed.  Isaac [name] 10/02/2021, 10:33 AM

## 2021-10-02 NOTE — Procedures (Signed)
Thoracentesis  Procedure Note  Isaac [name]  [id number]  [birth year]  Date:10/02/21  Time:4:32 PM   Provider Performing:Ruari Mudgett Mamie Nick Carlis Abbott   Procedure: Thoracentesis with imaging guidance (59470)  Indication(s) Pleural Effusion  Consent Risks of the procedure as well as the alternatives and risks of each were explained to the patient and/or caregiver.  Consent for the procedure was obtained and is signed in the bedside chart  Anesthesia Topical only with 1% lidocaine    Time Out Verified patient identification, verified procedure, site/side was marked, verified correct patient position, special equipment/implants available, medications/allergies/relevant history reviewed, required imaging and test results available.   Sterile Technique Maximal sterile technique including full sterile barrier drape, hand hygiene, sterile gown, sterile gloves, mask, hair covering, sterile ultrasound probe cover (if used).  Procedure Description Ultrasound was used to identify appropriate pleural anatomy for placement and overlying skin marked.  Area of drainage cleaned and draped in sterile fashion. Lidocaine was used to anesthetize the skin and subcutaneous tissue.  1300 cc of amber appearing fluid was drained from the left pleural space. Catheter then removed and bandaid applied to site.   Complications/Tolerance None; patient tolerated the procedure well. Chest X-ray is ordered to confirm no post-procedural complication.   EBL Minimal   Specimen(s) Pleural fluid-- cell count with diff, culture  Julian Hy, DO 10/02/21 4:33 PM [facility]

## 2021-10-02 NOTE — Progress Notes (Signed)
Hypoglycemic Event  CBG: 67  Treatment: D50 25 mL (12.5 gm)  Symptoms: None  Follow-up CBG: Time: 00:12 CBG Result: 94  Possible Reasons for Event: Inadequate meal intake  Comments/MD notified: Will pass on to day team in [place]

## 2021-10-02 NOTE — Progress Notes (Signed)
[place] for Warfarin  Indication: LVAD/AF  Allergies  Allergen Reactions   Mushroom Extract Complex Nausea And Vomiting   Neosporin [Neomycin-Bacitracin Zn-Polymyx] Hives    Patient Measurements: Height: 5\' 9"  (175.3 cm) Weight: 80.4 kg (177 lb 4 oz) (weighed with pt plus system controller on neck sling) IBW/kg (Calculated) : 70.7 Heparin Dosing Weight: 83.7 kg  Vital Signs: Temp: 98.3 F (36.8 C) (01/11 2310) Temp Source: Oral (01/11 2310) BP: 117/74 (01/12 0600) Pulse Rate: 142 (01/12 0600)  Labs: Recent Labs     0000 09/30/21 0317 09/30/21 0941 09/30/21 1747 10/01/21 0345 10/01/21 0927 10/01/21 1532 10/02/21 0415  HGB   < >  --  8.2*  --   --  7.8*  --  7.7*  HCT  --   --  24.7*  --   --  23.8*  --  24.2*  PLT  --   --  519*  --   --  541*  --  576*  LABPROT  --  27.2*  --   --  25.4*  --   --  24.6*  INR  --  2.5*  --   --  2.3*  --   --  2.2*  CREATININE  --  1.49*  --    < > 1.52*  --  1.50* 1.46*   < > = values in this interval not displayed.     Estimated Creatinine Clearance: 47.8 mL/min (A) (by C-G formula based on SCr of 1.46 mg/dL (H)).   Medical History: Past Medical History:  Diagnosis Date   Arrhythmia    atrial fibrillation   CHF (congestive heart failure) (HCC)    Chronic kidney disease    Coronary artery disease    Hyperlipidemia    Hypertension    Myocardial infarct [facility])     Assessment: 71 yo male on chronic Eliquis PTA for afib.  Now s/p LVAD placement 09/17/21, pharmacy asked to begin anticoagulation with warfarin.   INR is therapeutic at 2.2, EPW out. LDH fairly stable and hemoglobin low but fairly stable,no overt bleeding noted.  Planning to get 1 unit PRBCs.  Not eating much now - considering tube feeding.  Goal of Therapy:  INR 2-2.5 Monitor platelets by anticoagulation protocol: Yes   Plan:  -Continue Warfarin 1 mg PO daily  -Daily INR, CBC, LDH -If initiating tube feeds will  likely need to increase warfarin dose.  [place] Crane, Roylene Reason, BCCP Clinical Pharmacist  10/02/2021 8:02 AM   [facility] pharmacy phone numbers are listed on [email or web address]

## 2021-10-03 ENCOUNTER — Inpatient Hospital Stay (HOSPITAL_COMMUNITY): Payer: Medicare HMO

## 2021-10-03 DIAGNOSIS — R570 Cardiogenic shock: Secondary | ICD-10-CM | POA: Diagnosis not present

## 2021-10-03 LAB — CBC
HCT: 22.4 % — ABNORMAL LOW (ref 39.0–52.0)
Hemoglobin: 7.2 g/dL — ABNORMAL LOW (ref 13.0–17.0)
MCH: 28.7 pg (ref 26.0–34.0)
MCHC: 32.1 g/dL (ref 30.0–36.0)
MCV: 89.2 fL (ref 80.0–100.0)
Platelets: 534 10*3/uL — ABNORMAL HIGH (ref 150–400)
RBC: 2.51 MIL/uL — ABNORMAL LOW (ref 4.22–5.81)
RDW: 20.5 % — ABNORMAL HIGH (ref 11.5–15.5)
WBC: 16.2 10*3/uL — ABNORMAL HIGH (ref 4.0–10.5)
nRBC: 0.2 % (ref 0.0–0.2)

## 2021-10-03 LAB — RENAL FUNCTION PANEL
Albumin: 1.9 g/dL — ABNORMAL LOW (ref 3.5–5.0)
Albumin: 1.9 g/dL — ABNORMAL LOW (ref 3.5–5.0)
Anion gap: 5 (ref 5–15)
Anion gap: 8 (ref 5–15)
BUN: 17 mg/dL (ref 8–23)
BUN: 17 mg/dL (ref 8–23)
CO2: 27 mmol/L (ref 22–32)
CO2: 26 mmol/L (ref 22–32)
Calcium: 7.9 mg/dL — ABNORMAL LOW (ref 8.9–10.3)
Calcium: 7.6 mg/dL — ABNORMAL LOW (ref 8.9–10.3)
Chloride: 102 mmol/L (ref 98–111)
Chloride: 102 mmol/L (ref 98–111)
Creatinine, Ser: 1.48 mg/dL — ABNORMAL HIGH (ref 0.61–1.24)
Creatinine, Ser: 1.39 mg/dL — ABNORMAL HIGH (ref 0.61–1.24)
GFR, Estimated: 51 mL/min — ABNORMAL LOW (ref >60)
GFR, Estimated: 55 mL/min — ABNORMAL LOW (ref >60)
Glucose, Bld: 193 mg/dL — ABNORMAL HIGH (ref 70–99)
Glucose, Bld: 149 mg/dL — ABNORMAL HIGH (ref 70–99)
Phosphorus: <1 mg/dL — CL (ref 2.5–4.6)
Phosphorus: 2.3 mg/dL — ABNORMAL LOW (ref 2.5–4.6)
Potassium: 4.4 mmol/L (ref 3.5–5.1)
Potassium: 4 mmol/L (ref 3.5–5.1)
Sodium: 134 mmol/L — ABNORMAL LOW (ref 135–145)
Sodium: 136 mmol/L (ref 135–145)

## 2021-10-03 LAB — GLUCOSE, CAPILLARY
Glucose-Capillary: 170 mg/dL — ABNORMAL HIGH (ref 70–99)
Glucose-Capillary: 118 mg/dL — ABNORMAL HIGH (ref 70–99)
Glucose-Capillary: 124 mg/dL — ABNORMAL HIGH (ref 70–99)
Glucose-Capillary: 106 mg/dL — ABNORMAL HIGH (ref 70–99)
Glucose-Capillary: 115 mg/dL — ABNORMAL HIGH (ref 70–99)

## 2021-10-03 LAB — PROTIME-INR
INR: 1.9 — ABNORMAL HIGH (ref 0.8–1.2)
Prothrombin Time: 21.9 seconds — ABNORMAL HIGH (ref 11.4–15.2)

## 2021-10-03 LAB — COOXEMETRY PANEL
Carboxyhemoglobin: 2.1 % — ABNORMAL HIGH (ref 0.5–1.5)
Methemoglobin: 0.9 % (ref 0.0–1.5)
O2 Saturation: 67.3 %
Total hemoglobin: 7.8 g/dL — ABNORMAL LOW (ref 12.0–16.0)

## 2021-10-03 LAB — PREPARE RBC (CROSSMATCH)

## 2021-10-03 LAB — MAGNESIUM
Magnesium: 2.4 mg/dL (ref 1.7–2.4)
Magnesium: 2.2 mg/dL (ref 1.7–2.4)

## 2021-10-03 LAB — LACTATE DEHYDROGENASE: LDH: 313 U/L — ABNORMAL HIGH (ref 98–192)

## 2021-10-03 MED ORDER — DOCUSATE SODIUM 50 MG/5ML PO LIQD
200.0000 mg | Freq: Every day | ORAL | Status: DC
  Administered 2021-10-03 – 2021-10-05 (×3): 200 mg
  Filled 2021-10-03 (×4): qty 20

## 2021-10-03 MED ORDER — SODIUM CHLORIDE 0.9% IV SOLUTION: Freq: Once | INTRAVENOUS | Status: DC

## 2021-10-03 MED ORDER — PIVOT 1.5 CAL PO LIQD
1000.0000 mL | ORAL | Status: DC
  Administered 2021-10-03 – 2021-10-09 (×6): 1000 mL

## 2021-10-03 MED ORDER — WARFARIN SODIUM 2 MG PO TABS
2.0000 mg | ORAL_TABLET | Freq: Once | ORAL | Status: AC
Start: 2021-10-03 — End: 2021-10-03
  Administered 2021-10-03: 2 mg via ORAL
  Filled 2021-10-03: qty 1

## 2021-10-03 MED ORDER — SODIUM PHOSPHATES 45 MMOLE/15ML IV SOLN
50.0000 mmol | Freq: Once | INTRAVENOUS | Status: AC
  Administered 2021-10-03: 50 mmol via INTRAVENOUS
  Filled 2021-10-03: qty 16.67

## 2021-10-03 NOTE — Progress Notes (Signed)
LVAD Coordinator Rounding Note:  Admitted 09/06/21 due to CHF exacerbation to Dr Claris Gladden service. He was started on Milrinone on admission. Febrile n 09/10/21- suspected to have UTI- antibiotics started. Impella 5.5 placed 09/12/21.   HM III LVAD implanted on 09/17/21 by Dr Cyndia Bent under destination therapy criteria. 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   Patient walking in hallway this am. Reports he feels "better" today. Co-ox much improved t 67% this am. Remains on Milrinone and Levo. Currently in NSR on monitor.   Dr. Aundra Dubin in room at end of walk to assess patient. BS nurse reports 5L removed per CVVH along with 1.6 L from left pleural effusion yesterday via thoracentesis. Planned repeat echo Sunday or Monday per Dr. Aundra Dubin to optimize VAD speed.   CRRT pulling @ 150 cc/hr this am.    Vital signs: Temp: 98.6 HR:  69 - SR Doppler Pressure: 70 Radial Art Line: 79/76 (73) O2 Sat: 93% on 4L [place] Wt: 185.2>196.8>194>189.1>192.2>198.4>189.6>188.7>183.6>177>167.9 lbs    LVAD interrogation reveals:  Speed: 5000 Flow: 4.6 Power: 3.4w PI: 1.9 Hematocrit: 22  Alarms: none Events:  >100 PI yesterday  Fixed speed: 5000 Low speed limit: 4700  Drive Line: Existing VAD dressing C/D/I with anchor intact and accurately applied. M/W/F dressing changes per Nurse Davonna Belling, [place] coordinator, or trained caregiver.  Next dressing change due: 10/03/21.    Labs:  LDH trend: 344>379>370>378>380>331>296>281>291>293>295 >314>313 INR trend: 1.6>1.8>2.8>2.9>2.1>2.0>2.2>2.4>2.4>2.5>2.3>1.9  WBC trend: 14.1>13.6>13.1>11>8.7>10.1>13.5>14>14.2>14.3>13.2>16.2  Cr trend: 2.11>2.39>5.24>3.45>2.38>1.97>1.87>1.68>1.73>1.60>1.49>1.52>1.48  Hgb trend: 6.5>7.5>8.1>8.8>7.7>8.6>7.7>7.8>8.2>7.2  Anticoagulation Plan: -INR Goal: 2.0 - 2.5 -ASA Dose: 81 mg daily   Blood Products:  Intra Op: 09/17/21  1 PLT  4 FFP   4  PRBC  Cellsaver- 803 cc Post Op:  -  09/17/21>>1 PRBC -  09/18/21>> 3 PRBCs -  09/19/21>> 1 PRBC -  09/26/21>> 1 PRBC -  10/02/21>>1 PRBC -  10/03/21>> 1 PRBC  Device: - [employer] dual -Therapies: OFF  *Will need to be referred to device clinic at discharge*  Arrythmias: 3 episodes of VT that required defib while in OR 09/17/21. Currently on Amiodarone gtt. Remains in afib.   Respiratory: extubated 09/18/22  Nitric Oxide:  off 09/18/22  Renal:  09/23/21>>started CVVH  Infection:  09/10/21>> blood cultures >> No growth 5 days; final 09/12/21>> urine culture >> No growth; final  09/25/21>>blood cultures>>negative  09/27/21>>pleural effusion>>negative, final  Drips:  Milrinone 0.25 mcg/kg/min Levo 3 mcg/min   Patient Education: No family at bedside. Patient not feeling well; inappropriate for teaching this am.   Plan/Recommendations:  1. [place] VAD coordinator for drive line or equipment concerns 2. Daily drive line dressing change per Nurse Davonna Belling or VAD coordinator   Zada Girt RN [place] Coordinator  Office: [phone]  24/7 Pager: [phone]

## 2021-10-03 NOTE — Procedures (Signed)
Cortrak  Tube Type:  Cortrak - 43 inches Tube Location:  Left nare Initial Placement:  Postpyloric Secured by: Bridle Technique Used to Measure Tube Placement:  Marking at nare/corner of mouth Cortrak Secured At:  100 cm  Cortrak Tube Team Note:  Consult received to place a Cortrak feeding tube.   X-ray is required, abdominal x-ray has been ordered by the Cortrak team. Please confirm tube placement before using the Cortrak tube.   If the tube becomes dislodged please keep the tube and contact the Cortrak team at [email or web address] ([email or web address]) for replacement.  If after hours and replacement cannot be delayed, place a NG tube and confirm placement with an abdominal x-ray.    Koleen Distance MS, RD, LDN Please refer to [facility] for RD and/or RD on-call/weekend/after hours pager

## 2021-10-03 NOTE — Progress Notes (Signed)
OT Cancellation Note  Patient Details Name: Isaac [name] MRN: [id number] DOB: 16-Jan-1951   Cancelled Treatment:    Reason Eval/Treat Not Completed: Fatigue/lethargy limiting ability to participate. Pt recently walked with RN staff, attempted for ADL/LVAD and Pt falling asleep during conversation. OT will continue to follow acutely.   Merri Ray Samanvi Cuccia 10/03/2021, 10:12 AM  Jesse Sans OTR/L Acute Rehabilitation Services Pager: [phone] Office: [phone]

## 2021-10-03 NOTE — Progress Notes (Signed)
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 Acute component either from cardiorenal +/- ATN with decreased renal function from hemodynamics and CPB + hypotensive episodes + pressors.  - CRRT started on 1/3.       - back on NE and milrinone     - UF as tolerated today- continue net negative 100-200 mL/ hr          -Cont monitoring daily I/Os + weights  -Maintain MAP>65 for optimal renal perfusion.  -Avoid nephrotoxic medications including NSAIDs, contrast. - Dose medications for a GFR <15 ml/min and also on CRRT.   Cardiogenic shock: s/p LVAD 09/17/22 (HM 3).   - TTE to assess for RV failure--> RV function down - VAD speed decreased 10/02/21 - on NE and milrinone again  CAD with h/o PCI last in 2013 Anemia Afib- on amio Acute hypoxic RF: s/p thoracentesis 1.3 L 10/02/21 Dispo: ICU  Subjective:    Improved after several interventions yesterday- TTE showing RV systolic dysfunction so VAD speed reduced.  Improved co-ox, was able to increase UF on CRRT.  Still on milrinone and NE.  Had thoracentesis yesterday.  Walked this AM   Objective:   BP (!) 77/59    Pulse (!) 29    Temp 98.6 F (37 C) (Oral)    Resp (!) 21    Ht 5\' 9"  (1.753 m)    Wt 74.8 kg    SpO2 97%    BMI 24.35 kg/m   Intake/Output Summary (Last 24 hours) at 10/03/2021 1011 Last data filed at 10/03/2021 1000 Gross per 24 hour  Intake 2074.54 ml  Output 6535 ml  Net -4460.46 ml   Weight change: -5.6 kg  Physical Exam: GEN: NAD, A&Ox3 appears tired HEENT: wearing glasses NECK: + JVD to angle of mandible LUNGS: faint basilar carkcles CV: LVAD hum ABD: + bowel sounds EXT: 3+ lower extremity edema and also present in the UE,  ACCESS: rt IJ TC   Imaging: DG CHEST PORT 1 VIEW  Result Date: 10/02/2021 CLINICAL DATA:  Status post thoracentesis. EXAM: PORTABLE CHEST 1 VIEW COMPARISON:  Chest x-ray from same day. FINDINGS:  Unchanged left chest wall pacemaker and tunneled right internal jugular dialysis catheter. Unchanged right upper extremity PICC line. Stable cardiomegaly with LVAD in place. No significant residual left pleural effusion status post thoracentesis. No pneumothorax. Unchanged hazy density at the right lung base likely reflecting layering small pleural effusion and atelectasis. No acute osseous abnormality. IMPRESSION: 1. No significant residual left pleural effusion status post thoracentesis. No pneumothorax. Electronically Signed   By: Titus Dubin M.D.   On: 10/02/2021 17:13   DG CHEST PORT 1 VIEW  Result Date: 10/02/2021 CLINICAL DATA:  Shortness of breath EXAM: PORTABLE CHEST 1 VIEW COMPARISON:  09/27/2021, 09/25/2021 FINDINGS: Post sternotomy changes. Left-sided pacing device as before. Partially visualized LVAD. Right-sided central venous catheter with tip over the right atrium. Cardiomegaly with vascular congestion and worsening pulmonary edema. At least moderate left pleural effusion with suspected right pleural effusion. Basilar airspace disease. IMPRESSION: 1. Cardiomegaly with vascular congestion and worsening pulmonary edema. Increased left greater than right pleural effusions, effusion on the left is at least moderate in size. 2. Basilar consolidations either due to atelectasis or pneumonia Electronically Signed   By: Donavan Foil M.D.   On: 10/02/2021 15:51   ECHOCARDIOGRAM LIMITED  Result Date: 10/02/2021    ECHOCARDIOGRAM  LIMITED REPORT   Patient Name:   Isaac [name] Date of Exam: 10/02/2021 Medical Rec #:  [id number]           Height:       69.0 in Accession #:    [id number]          Weight:       177.2 lb Date of Birth:  Jan 23, 1951            BSA:          1.963 m Patient Age:    71 years            BP:           75/54 mmHg Patient Gender: M                   HR:           64 bpm. Exam Location:  Inpatient Procedure: Limited Echo, Limited Color Doppler and Cardiac Doppler Indications:     CHF-Acute Systolic U38.45  History:        Patient has prior history of Echocardiogram examinations, most                 recent 10/02/2021. CHF, Previous Myocardial Infarction and CAD;                 Risk Factors:Hypertension and Dyslipidemia.  Sonographer:    Bernadene Person RDCS Referring Phys: [place]  1. Left ventricular ejection fraction, by estimation, is 25 to 30%. The left ventricle has severely decreased function. The left ventricle demonstrates global hypokinesis. There is mild left ventricular hypertrophy. The interventricular septum remains shifted to the left but less than on earlier echo today. The LV cavity is also larger. LVAD inflow cannula at apex. The LVAD speed was decreased to 5000 rpm.  2. Peak RV-RA gradient 34 mmHg. Right ventricular systolic function is moderately reduced. The right ventricular size is severely enlarged. There is mildly elevated pulmonary artery systolic pressure.  3. Left atrial size was moderately dilated.  4. Right atrial size was moderately dilated.  5. The tricuspid valve is abnormal. Tricuspid valve regurgitation is severe.  6. The aortic valve does not open. The aortic valve is tricuspid. Aortic valve regurgitation is trivial.  7. The mitral valve is normal in structure. No evidence of mitral valve regurgitation. No evidence of mitral stenosis.  8. There is a left pleural effusion. There is a moderate pericardial effusion primarily along the lateral LV wall. This is significantly larger than on previous echo. There is no tamponade. FINDINGS  Left Ventricle: Left ventricular ejection fraction, by estimation, is 25 to 30%. The left ventricle has severely decreased function. The left ventricle demonstrates global hypokinesis. The left ventricular internal cavity size was normal in size. There is mild left ventricular hypertrophy. Right Ventricle: Peak RV-RA gradient 34 mmHg. The right ventricular size is severely enlarged. No increase in right  ventricular wall thickness. Right ventricular systolic function is moderately reduced. There is mildly elevated pulmonary artery systolic pressure. The tricuspid regurgitant velocity is 2.90 m/s, and with an assumed right atrial pressure of 8 mmHg, the estimated right ventricular systolic pressure is 36.4 mmHg. Left Atrium: Left atrial size was moderately dilated. Right Atrium: Right atrial size was moderately dilated. Pericardium: There is a left pleural effusion. There is a moderate pericardial effusion primarily along the lateral LV wall. This is significantly larger than on previous echo. There is no tamponade. Mitral Valve:  The mitral valve is normal in structure. No evidence of mitral valve stenosis. Tricuspid Valve: The tricuspid valve is abnormal. Tricuspid valve regurgitation is severe. Aortic Valve: The aortic valve does not open. The aortic valve is tricuspid. Aortic valve regurgitation is trivial. Pulmonic Valve: The pulmonic valve was normal in structure. Pulmonic valve regurgitation is trivial. Additional Comments: A device lead is visualized in the right ventricle. LEFT VENTRICLE PLAX 2D LVIDd:         3.40 cm  RIGHT VENTRICLE RV S prime:     7.56 cm/s PULMONIC VALVE RVOT Peak grad: 1 mmHg  TRICUSPID VALVE TR Peak grad:   33.6 mmHg TR Vmax:        290.00 cm/s  SHUNTS Pulmonic VTI: 0.069 m Dalton McleanMD Electronically signed by Franki Monte Signature Date/Time: 10/02/2021/4:08:36 PM    Final    ECHOCARDIOGRAM LIMITED  Result Date: 10/02/2021    ECHOCARDIOGRAM LIMITED REPORT   Patient Name:   Isaac [name] Date of Exam: 10/02/2021 Medical Rec #:  [id number]           Height:       69.0 in Accession #:    [id number]          Weight:       177.2 lb Date of Birth:  Mar 28, 1951            BSA:          1.963 m Patient Age:    69 years            BP:           84/74 mmHg Patient Gender: M                   HR:           62 bpm. Exam Location:  Inpatient Procedure: Limited Echo, Cardiac Doppler and  Color Doppler Indications:    CHF-Acute Systolic P53.74  History:        Patient has prior history of Echocardiogram examinations, most                 recent 09/23/2021. CHF, Previous Myocardial Infarction and CAD;                 Risk Factors:Hypertension and Dyslipidemia.  Sonographer:    Bernadene Person RDCS Referring Phys: [place]  1. Left ventricular ejection fraction, by estimation, is 25 to 30%. The left ventricle has severely decreased function. The left ventricle demonstrates global hypokinesis. There is mild left ventricular hypertrophy. The LV is small and underfilled, the interventricular septum is shifted left. The LVAD speed was serially decreased from 5600 rpm to 5100 rpm. LVAD inflow cannula at apex.  2. Peak RV-RA gradient 28 mmHg. Right ventricular systolic function is moderately reduced. The right ventricular size is severely enlarged. There is mildly elevated pulmonary artery systolic pressure.  3. The mitral valve is normal in structure. No evidence of mitral valve regurgitation. No evidence of mitral stenosis.  4. The tricuspid valve is abnormal. Tricuspid valve regurgitation is severe.  5. The aortic valve does not open at any of the evaluated LVAD speeds. The aortic valve is tricuspid. Aortic valve regurgitation is trivial.  6. Left atrial size was moderately dilated.  7. Right atrial size was moderately dilated.  8. Moderate pericardial effusion primarily adjacent to the lateral wall of the left ventricle. No tamponade. Effusion is significantly larger than on the prior echo. There is a  left pleural effusion. FINDINGS  Left Ventricle: Left ventricular ejection fraction, by estimation, is 25 to 30%. The left ventricle has severely decreased function. The left ventricle demonstrates global hypokinesis. The left ventricular internal cavity size was small. There is mild left ventricular hypertrophy. Right Ventricle: Peak RV-RA gradient 28 mmHg. The right ventricular size  is severely enlarged. Right ventricular systolic function is moderately reduced. There is mildly elevated pulmonary artery systolic pressure. The tricuspid regurgitant velocity is 2.66 m/s, and with an assumed right atrial pressure of 8 mmHg, the estimated right ventricular systolic pressure is 76.2 mmHg. Left Atrium: Left atrial size was moderately dilated. Right Atrium: Right atrial size was moderately dilated. Pericardium: Moderate pericardial effusion primarily adjacent to the lateral wall of the left ventricle. No tamponade. Effusion is significantly larger than on the prior echo. There is a left pleural effusion. Mitral Valve: The mitral valve is normal in structure. No evidence of mitral valve stenosis. Tricuspid Valve: The tricuspid valve is abnormal. Tricuspid valve regurgitation is severe. Aortic Valve: The aortic valve does not open at any of the evaluated LVAD speeds. The aortic valve is tricuspid. Aortic valve regurgitation is trivial. Pulmonic Valve: The pulmonic valve was normal in structure. Pulmonic valve regurgitation is trivial. Additional Comments: A device lead is visualized in the right ventricle. RIGHT VENTRICLE RV S prime:     7.02 cm/s PULMONIC VALVE RVOT Peak grad: 1 mmHg  TRICUSPID VALVE TR Peak grad:   28.3 mmHg TR Vmax:        266.00 cm/s  SHUNTS Pulmonic VTI: 0.077 m Franki Monte Electronically signed by Franki Monte Signature Date/Time: 10/02/2021/4:04:34 PM    Final     Labs: BMET Recent Labs  Lab 09/30/21 2633 09/30/21 1747 10/01/21 0345 10/01/21 1532 10/02/21 0415 10/02/21 1722 10/03/21 0406  NA 135 135 136 135 136 134* 134*  K 4.3 4.3 4.1 4.7 4.5 4.3 4.4  CL 103 103 103 102 101 102 102  CO2 25 24 24 25 25 25 27   GLUCOSE 84 82 101* 98 72 171* 193*  BUN 16 17 16 18 15 16 17   CREATININE 1.49* 1.68* 1.52* 1.50* 1.46* 1.43* 1.48*  CALCIUM 7.8* 7.9* 7.9* 8.0* 8.2* 7.8* 7.9*  PHOS 2.4* 2.6 2.0* 2.3* 1.9* 1.7* <1.0*   CBC Recent Labs  Lab 09/28/21 1951  09/29/21 0311 09/30/21 0941 10/01/21 0927 10/02/21 0415 10/02/21 1400 10/03/21 0406  WBC 14.2*   < > 14.1* 13.2* 13.2*  --  16.2*  NEUTROABS 11.5*  --   --   --   --   --   --   HGB 7.7*   < > 8.2* 7.8* 7.7* 8.2* 7.2*  HCT 23.7*   < > 24.7* 23.8* 24.2* 25.5* 22.4*  MCV 86.5   < > 86.7 87.5 90.3  --  89.2  PLT 475*   < > 519* 541* 576*  --  534*   < > = values in this interval not displayed.    Medications:     (feeding supplement) PROSource Plus  30 mL Oral TID WC   sodium chloride   Intravenous Once   sodium chloride   Intravenous Once   sodium chloride   Intravenous Once   sodium chloride   Intravenous Once   sodium chloride   Intravenous Once   acetaminophen  500 mg Oral Q6H   amiodarone  200 mg Oral BID   aspirin  81 mg Oral Daily   Chlorhexidine Gluconate Cloth  6 each Topical  Daily   docusate sodium  200 mg Oral Daily   feeding supplement  237 mL Oral TID BM   feeding supplement (KATE FARMS STANDARD 1.4)  325 mL Oral TID BM   mouth rinse  15 mL Mouth Rinse BID   metoCLOPramide (REGLAN) injection  5 mg Intravenous Q8H   midodrine  15 mg Oral TID WC   montelukast  5 mg Oral QHS   multivitamin  1 tablet Oral QHS   pantoprazole  40 mg Oral Daily   polyethylene glycol  17 g Oral Daily   rosuvastatin  10 mg Oral Daily   sodium chloride flush  10-40 mL Intracatheter Q12H   sodium chloride flush  3 mL Intravenous Q12H   warfarin  1 mg Oral q1600   Warfarin - Pharmacist Dosing Inpatient   Does not apply q1600      Madelon Lips, MD 10/03/2021, 10:11 AM

## 2021-10-03 NOTE — Progress Notes (Signed)
Outpatient HF CSW met with pt at bedside to check in.  Pt reports he is doing ok at this time- feeling better than yesterday at least.  Was up walking this morning and appears to be very tired as he had trouble keeping his eyes open during conversation.   CSW will continue to follow pt during hospital stay  Jorge Ny, [place] [facility] Desk#: [phone] Cell#: [phone]

## 2021-10-03 NOTE — Progress Notes (Signed)
Nutrition Follow-up  DOCUMENTATION CODES:   Severe malnutrition in context of chronic illness  INTERVENTION:   Recommend aggressive supplementation of phosphorus while on CRRT given increased losses and current phos <1.0  Tube Feeding via Cortrak:  Initiate Pivot 1.5 at 30 ml/hr, goal rate of 65 ml/hr Titrate by 10 mL q 8 hours until goal rate 65 ml/hr Goal rate provide 2340 kals, 146  g of protein 1186 mL of free water  Once pt demonstrates tolerance of TF, plan to transition to nocturnal TF in hopes to promote po intake  Continue Ensure Enlive po TID, each supplement provides 350 kcal and 20 grams of protein  D/C Dahlia Bailiff does not like the taste D/C Pro-Source as pt does not like and meeting protein needs currently via TF  Continue Renal MVI  NUTRITION DIAGNOSIS:   Severe Malnutrition related to chronic illness (heart failure) as evidenced by severe fat depletion, severe muscle depletion.  Being addressed via TF   GOAL:   Patient will meet greater than or equal to 90% of their needs  Progressing  MONITOR:   PO intake, Supplement acceptance, Labs, Weight trends  REASON FOR ASSESSMENT:   Consult LVAD Eval  ASSESSMENT:   71 yo male admitted with acute on chronic CHF. PMH includes CHF, CAD, CKD, HLD, HTN  12/23 Impella 5.5 placed 12/28 HM3 LVAD plaecd, Impella removed, Intubated 12/29 Extubated 12/31 FL diet 01/02 Diet advanced to 2g sodium 01/03 CRRT initiated 01/11 Calorie count indicating inadequate oral intake, Cortrak recommended 01/12 Thoracentesis with 1.3 L removed 01/13 Cortrak placed  Pt continues to ambulate daily  Pt remains on CRRT, appears more alert today. Pt appeared to tolerate Cortrak placement well other than appeared to be nauseated, reaching for vomit bag multiple times.   Cortrak in stomach vs duodenum per Radiologist. This Cortrak RD was present during placement and also reviewed tracing. Per Cortrak reading, it is likely  post-pyloric placement. Ok to feed either gastric or post-pyloric but feel that pt would benefit from post-pyloric given episodes of emesis, early satiety, etc  Wife not present on visit today  Noted lunch tray today which consisted of tomato soup and sandwich with just lettuce, tomato and onion. This meal essentially provides NO PROTEIN. Discussed importance of protein at each meal; will not be able to remove Cortrak until po intake better and taking adequate protein by mouth.   Per RN, pt did eat veggie sandwich from [employer] yesterday as well as Ensure and [employer]. Pt did not like [employer], says it "tastes like [employer]"- which makes total sense given it is made from pea protein  Phosphorus <1.0, supplemented today  Labs: phosphorus <1.0 Meds: reglan, rena-vite, miralax, colace    Diet Order:   Diet Order             Diet regular Room service appropriate? Yes; Fluid consistency: Thin  Diet effective now                   EDUCATION NEEDS:   Education needs have been addressed  Skin:  Skin Assessment: Reviewed RN Assessment  Last BM:  1/09 +large type 7 BM, +flatus  Height:   Ht Readings from Last 1 Encounters:  10/02/21 5\' 9"  (1.753 m)    Weight:   Wt Readings from Last 1 Encounters:  10/03/21 74.8 kg    BMI:  Body mass index is 24.35 kg/m.  Estimated Nutritional Needs:   Kcal:  9629-5284 kcals  Protein:  135-160  g  Fluid:  1.8 L   Kerman Passey MS, RDN, LDN, CNSC Registered Dietitian III Clinical Nutrition RD Pager and On-Call Pager Number Located in [place]

## 2021-10-03 NOTE — Progress Notes (Signed)
Physical Therapy Treatment Patient Details Name: Isaac [name] MRN: [id number] DOB: 15-Dec-1950 Today's Date: 10/03/2021   History of Present Illness Pt is a 71 y.o. male admitted 09/07/21 with near-syncope, nausea, poor oral intake, generalized weakness. Workup for advanced heart failure and persistent inotropic with requirement, cardiogenic shock. [place] 12/19. Impella placed on 12/23. LVAD placed 12/28. Pt with post-op delirium and agitation. CRRT initiated 1/3. S/p thoracentesis 1/12. S/p cortrak placement 1/13. PMH includes advanced CHF (LVEF <20%), AICD/PPM, PAF on Eliquis, CKD 3, HTN, HLD.   PT Comments    Pt progressing with mobility. Today's session focused on ambulation for improving strength and activity tolerance; pt requiring up to [facility] for mobility, assist for LVAD management. Despite fatigue and discomfort, pt remains motivated to participate. Pt remains limited by generalized weakness, decreased activity tolerance, and impaired balance strategies/postural reactions. Continue to feel pt could benefit from intensive CIR-level therapies to maximize functional mobility and independence prior to return home.    Recommendations for follow up therapy are one component of a multi-disciplinary discharge planning process, led by the attending physician.  Recommendations may be updated based on patient status, additional functional criteria and insurance authorization.  Follow Up Recommendations  Acute inpatient rehab (3hours/day)     Assistance Recommended at Discharge Frequent or constant Supervision/Assistance  Patient can return home with the following A little help with walking and/or transfers;A little help with bathing/dressing/bathroom;Assistance with cooking/housework;Help with stairs or ramp for entrance;Assist for transportation   Equipment Recommendations  TBD - potentially Rollator (4 wheels);BSC/3in1    Recommendations for Other Services       Precautions / Restrictions  Precautions Precautions: Fall;Sternal;Other (comment)     Mobility  Bed Mobility Overal bed mobility: Needs Assistance Bed Mobility: Supine to Sit;Sit to Supine     Supine to sit: Mod assist Sit to supine: Mod assist   General bed mobility comments: ModA for trunk elevation sitting EOB; modA for BLE management with return to supine    Transfers Overall transfer level: Needs assistance Equipment used:  (eva walker) Transfers: Sit to/from Stand Sit to Stand: Min assist;Mod assist;+2 safety/equipment           General transfer comment: cues for hand placement, pt with preference for hands on knees, repeated cues for momentum; initial minA for trunk elevation standing from EOB; modA for additional stand from recliner during walk, noted increased fatigue    Ambulation/Gait Ambulation/Gait assistance: Min assist;Min guard;+2 safety/equipment Gait Distance (Feet): 105 Feet (+ 200') Assistive device: Ethelene Hal Gait Pattern/deviations: Step-through pattern;Decreased stride length;Trunk flexed Gait velocity: Decreased     General Gait Details: Slow, labored gait with eva walker, intermittent minA for stability and walker management; 1x prolonged seated rest break secondary to fatigue, 2x brief standing rest breaks; intermittent verbal cues for hip/trunk extension   Stairs             Wheelchair Mobility    Modified Rankin (Stroke Patients Only)       Balance Overall balance assessment: Needs assistance Sitting-balance support: No upper extremity supported;Feet supported Sitting balance-Leahy Scale: Fair     Standing balance support: No upper extremity supported Standing balance-Leahy Scale: Fair                              Cognition Arousal/Alertness: Awake/alert Behavior During Therapy: WFL for tasks assessed/performed;Flat affect Overall Cognitive Status: Within Functional Limits for tasks assessed  General Comments: WFL for simple tasks, but requires some increased/repeated cues with transfers (maintaining sternal prec)        Exercises      General Comments General comments (skin integrity, edema, etc.): pt doing well transitioning LVAD from walll<>battery power with intermittent cues; requires assist to unscrew when screwed on tight (suspect related to finger swelling)      Pertinent Vitals/Pain Pain Assessment: Faces Faces Pain Scale: Hurts a little bit Pain Location: cotrak insertion, generalized Pain Descriptors / Indicators: Discomfort Pain Intervention(s): Monitored during session    Home Living                          Prior Function            PT Goals (current goals can now be found in the care plan section) Progress towards PT goals: Progressing toward goals    Frequency    Min 3X/week      PT Plan Current plan remains appropriate    Co-evaluation              AM-PAC PT "6 Clicks" Mobility   Outcome Measure  Help needed turning from your back to your side while in a flat bed without using bedrails?: A Little Help needed moving from lying on your back to sitting on the side of a flat bed without using bedrails?: A Lot Help needed moving to and from a bed to a chair (including a wheelchair)?: A Little Help needed standing up from a chair using your arms (e.g., wheelchair or bedside chair)?: A Lot Help needed to walk in hospital room?: A Little Help needed climbing 3-5 steps with a railing? : Total 6 Click Score: 14    End of Session Equipment Utilized During Treatment: Oxygen Activity Tolerance: Patient tolerated treatment well Patient left: in bed;with call bell/phone within reach;with nursing/sitter in room Nurse Communication: Mobility status PT Visit Diagnosis: Unsteadiness on feet (R26.81);Other abnormalities of gait and mobility (R26.89);Muscle weakness (generalized) (M62.81)     Time: 3546-5681 PT Time Calculation  (min) (ACUTE ONLY): 28 min  Charges:  $Gait Training: 8-22 mins $Therapeutic Activity: 8-22 mins                     Mabeline Caras, PT, DPT Acute Rehabilitation Services  Pager [phone] Office [place] 10/03/2021, 4:54 PM

## 2021-10-03 NOTE — Progress Notes (Signed)
Patient ID: Isaac [name], male   DOB: 04-10-1951, 71 y.o.   MRN: [id number]    Advanced Heart Failure VAD Team Note  PCP-Cardiologist: None   Subjective:    12/19 RHC- RA 7, PA 39/14 (25), PCWP 11, CO 6.4 CI 3. Thermo 3.5 1.8. Lasix drip stopped after cath. 12/20 Swan removed.  Norepi 3 mcg added.  12/21 Fever. Blood CX drawn. UA + leukocytes. Started on vanc and cefepime. Diuresed with IV lasix + metolazone.  12/23 Impella 5.5 placed 12/28 HM III LVAD, ICD leads plastered to tricuspid valve with severe TR, valve not replaced and some improvement in TR with LVAD placement.  12/29 Extubated 01/03 Started CRRT 01/07  L thoracentesis and placement of [facility] 01/09 LVAD speed increased to 5600 rpm 01/10 Milrinone stopped. Amio gtt>>PO. Completed 5 day course Cefepime.  01/12 Ramp echo done: Moderate lateral pericardial effusion w/o tamponade, RV larger with severe dilation/moderate dysfunction and left shift of the septum, LV small and undershifted.  TR severe.  LVAD speed decreased to 5000 rpms at 2 different sessions.  Also had thoracentesis on left with 1.3 L off.   Patient feels better today after adjustments yesterday.  He has walked around the unit.   He remains on milrinone 0.25 and NE 3, MAP 70s.  CVVH ongoing, pulling 100-150 cc/hr with weight down significantly.  CVP remains 12.    He appears to be in NSR, now on po amiodarone.   LDH [phone] INR 1.9  LVAD INTERROGATION:  HeartMate III LVAD:   Flow 4.5 liters/min, speed 5000, power 3.3, PI 2.  Still with PI events but fewer.  VAD interrogated personally.      Objective:    Vital Signs:   Temp:  [98 F (36.7 C)-98.6 F (37 C)] 98.4 F (36.9 C) (01/13 0400) Pulse Rate:  [29-208] 74 (01/13 0700) Resp:  [14-29] 18 (01/13 0700) BP: (68-132)/(54-101) 75/63 (01/13 0700) SpO2:  [77 %-100 %] 94 % (01/13 0700) Weight:  [74.8 kg] 74.8 kg (01/13 0500) Last BM Date: 10/01/21 Mean arterial Pressure  70s    Intake/Output:   Intake/Output Summary (Last 24 hours) at 10/03/2021 0825 Last data filed at 10/03/2021 0700 Gross per 24 hour  Intake 1829.36 ml  Output 6693 ml  Net -4863.64 ml     Physical Exam   CVP 12 General: Well appearing this am. NAD.  HEENT: Normal. Neck: Supple, JVP 14 cm with prominent CV waves. Carotids OK.  Cardiac:  Mechanical heart sounds with LVAD hum present.  Lungs:  CTAB, normal effort.  Abdomen:  NT, ND, no HSM. No bruits or masses. +BS  LVAD exit site: Well-healed and incorporated. Dressing dry and intact. No erythema or drainage. Stabilization device present and accurately applied. Driveline dressing changed daily per sterile technique. Extremities:  Warm and dry. No cyanosis, clubbing, rash.  2+ ankle edema.  Neuro:  Alert & oriented x 3. Cranial nerves grossly intact. Moves all 4 extremities w/o difficulty. Affect pleasant     Telemetry   Appears to be NSR 70s (personally reviewed)   Labs   Basic Metabolic Panel: Recent Labs  Lab 09/29/21 0311 09/29/21 1722 09/30/21 0317 09/30/21 1747 10/01/21 0345 10/01/21 1532 10/02/21 0415 10/02/21 1722 10/03/21 0406  NA 133*   < > 135   < > 136 135 136 134* 134*  K 4.6   < > 4.3   < > 4.1 4.7 4.5 4.3 4.4  CL 99   < > 103   < >  103 102 101 102 102  CO2 27   < > 25   < > 24 25 25 25 27   GLUCOSE 122*   < > 84   < > 101* 98 72 171* 193*  BUN 15   < > 16   < > 16 18 15 16 17   CREATININE 1.60*   < > 1.49*   < > 1.52* 1.50* 1.46* 1.43* 1.48*  CALCIUM 8.0*   < > 7.8*   < > 7.9* 8.0* 8.2* 7.8* 7.9*  MG 2.4  --  2.4  --  2.3  --  2.4  --  2.4  PHOS 1.8*   < > 2.4*   < > 2.0* 2.3* 1.9* 1.7* <1.0*   < > = values in this interval not displayed.    Liver Function Tests: Recent Labs  Lab 09/27/21 0348 09/27/21 1600 10/01/21 0345 10/01/21 1532 10/02/21 0415 10/02/21 1722 10/03/21 0406  AST 35  --   --   --   --   --   --   ALT 6  --   --   --   --   --   --   ALKPHOS 170*  --   --   --   --   --   --    BILITOT 1.7*  --   --   --   --   --   --   PROT 5.7*  --   --   --   --   --   --   ALBUMIN 1.8*   1.8*   < > 1.8* 1.8* 1.9* 1.9* 1.9*   < > = values in this interval not displayed.   No results for input(s): LIPASE, AMYLASE in the last 168 hours. No results for input(s): AMMONIA in the last 168 hours.   CBC: Recent Labs  Lab 09/28/21 1951 09/29/21 0311 09/30/21 0941 10/01/21 0927 10/02/21 0415 10/02/21 1400 10/03/21 0406  WBC 14.2* 14.3* 14.1* 13.2* 13.2*  --  16.2*  NEUTROABS 11.5*  --   --   --   --   --   --   HGB 7.7* 7.8* 8.2* 7.8* 7.7* 8.2* 7.2*  HCT 23.7* 23.0* 24.7* 23.8* 24.2* 25.5* 22.4*  MCV 86.5 85.8 86.7 87.5 90.3  --  89.2  PLT 475* 445* 519* 541* 576*  --  534*    INR: Recent Labs  Lab 09/29/21 0311 09/30/21 0317 10/01/21 0345 10/02/21 0415 10/03/21 0406  INR 2.4* 2.5* 2.3* 2.2* 1.9*    Other results:   Imaging   DG CHEST PORT 1 VIEW  Result Date: 10/02/2021 CLINICAL DATA:  Status post thoracentesis. EXAM: PORTABLE CHEST 1 VIEW COMPARISON:  Chest x-ray from same day. FINDINGS: Unchanged left chest wall pacemaker and tunneled right internal jugular dialysis catheter. Unchanged right upper extremity PICC line. Stable cardiomegaly with LVAD in place. No significant residual left pleural effusion status post thoracentesis. No pneumothorax. Unchanged hazy density at the right lung base likely reflecting layering small pleural effusion and atelectasis. No acute osseous abnormality. IMPRESSION: 1. No significant residual left pleural effusion status post thoracentesis. No pneumothorax. Electronically Signed   By: Titus Dubin M.D.   On: 10/02/2021 17:13   DG CHEST PORT 1 VIEW  Result Date: 10/02/2021 CLINICAL DATA:  Shortness of breath EXAM: PORTABLE CHEST 1 VIEW COMPARISON:  09/27/2021, 09/25/2021 FINDINGS: Post sternotomy changes. Left-sided pacing device as before. Partially visualized LVAD. Right-sided central venous catheter with tip over the  right  atrium. Cardiomegaly with vascular congestion and worsening pulmonary edema. At least moderate left pleural effusion with suspected right pleural effusion. Basilar airspace disease. IMPRESSION: 1. Cardiomegaly with vascular congestion and worsening pulmonary edema. Increased left greater than right pleural effusions, effusion on the left is at least moderate in size. 2. Basilar consolidations either due to atelectasis or pneumonia Electronically Signed   By: Donavan Foil M.D.   On: 10/02/2021 15:51   ECHOCARDIOGRAM LIMITED  Result Date: 10/02/2021    ECHOCARDIOGRAM LIMITED REPORT   Patient Name:   KOBEY SIDES Date of Exam: 10/02/2021 Medical Rec #:  [id number]           Height:       69.0 in Accession #:    [id number]          Weight:       177.2 lb Date of Birth:  [birth year]            BSA:          1.963 m Patient Age:    76 years            BP:           75/54 mmHg Patient Gender: M                   HR:           64 bpm. Exam Location:  Inpatient Procedure: Limited Echo, Limited Color Doppler and Cardiac Doppler Indications:    CHF-Acute Systolic O37.85  History:        Patient has prior history of Echocardiogram examinations, most                 recent 10/02/2021. CHF, Previous Myocardial Infarction and CAD;                 Risk Factors:Hypertension and Dyslipidemia.  Sonographer:    Bernadene Person RDCS Referring Phys: [place]  1. Left ventricular ejection fraction, by estimation, is 25 to 30%. The left ventricle has severely decreased function. The left ventricle demonstrates global hypokinesis. There is mild left ventricular hypertrophy. The interventricular septum remains shifted to the left but less than on earlier echo today. The LV cavity is also larger. LVAD inflow cannula at apex. The LVAD speed was decreased to 5000 rpm.  2. Peak RV-RA gradient 34 mmHg. Right ventricular systolic function is moderately reduced. The right ventricular size is severely enlarged. There is  mildly elevated pulmonary artery systolic pressure.  3. Left atrial size was moderately dilated.  4. Right atrial size was moderately dilated.  5. The tricuspid valve is abnormal. Tricuspid valve regurgitation is severe.  6. The aortic valve does not open. The aortic valve is tricuspid. Aortic valve regurgitation is trivial.  7. The mitral valve is normal in structure. No evidence of mitral valve regurgitation. No evidence of mitral stenosis.  8. There is a left pleural effusion. There is a moderate pericardial effusion primarily along the lateral LV wall. This is significantly larger than on previous echo. There is no tamponade. FINDINGS  Left Ventricle: Left ventricular ejection fraction, by estimation, is 25 to 30%. The left ventricle has severely decreased function. The left ventricle demonstrates global hypokinesis. The left ventricular internal cavity size was normal in size. There is mild left ventricular hypertrophy. Right Ventricle: Peak RV-RA gradient 34 mmHg. The right ventricular size is severely enlarged. No increase in right ventricular wall thickness. Right ventricular systolic function  is moderately reduced. There is mildly elevated pulmonary artery systolic pressure. The tricuspid regurgitant velocity is 2.90 m/s, and with an assumed right atrial pressure of 8 mmHg, the estimated right ventricular systolic pressure is 40.9 mmHg. Left Atrium: Left atrial size was moderately dilated. Right Atrium: Right atrial size was moderately dilated. Pericardium: There is a left pleural effusion. There is a moderate pericardial effusion primarily along the lateral LV wall. This is significantly larger than on previous echo. There is no tamponade. Mitral Valve: The mitral valve is normal in structure. No evidence of mitral valve stenosis. Tricuspid Valve: The tricuspid valve is abnormal. Tricuspid valve regurgitation is severe. Aortic Valve: The aortic valve does not open. The aortic valve is tricuspid. Aortic  valve regurgitation is trivial. Pulmonic Valve: The pulmonic valve was normal in structure. Pulmonic valve regurgitation is trivial. Additional Comments: A device lead is visualized in the right ventricle. LEFT VENTRICLE PLAX 2D LVIDd:         3.40 cm  RIGHT VENTRICLE RV S prime:     7.56 cm/s PULMONIC VALVE RVOT Peak grad: 1 mmHg  TRICUSPID VALVE TR Peak grad:   33.6 mmHg TR Vmax:        290.00 cm/s  SHUNTS Pulmonic VTI: 0.069 m Chrystian Cupples McleanMD Electronically signed by Franki Monte Signature Date/Time: 10/02/2021/4:08:36 PM    Final    ECHOCARDIOGRAM LIMITED  Result Date: 10/02/2021    ECHOCARDIOGRAM LIMITED REPORT   Patient Name:   ADEYEMI HAMAD Date of Exam: 10/02/2021 Medical Rec #:  [id number]           Height:       69.0 in Accession #:    [id number]          Weight:       177.2 lb Date of Birth:  1950/10/19            BSA:          1.963 m Patient Age:    70 years            BP:           84/74 mmHg Patient Gender: M                   HR:           62 bpm. Exam Location:  Inpatient Procedure: Limited Echo, Cardiac Doppler and Color Doppler Indications:    CHF-Acute Systolic H84.69  History:        Patient has prior history of Echocardiogram examinations, most                 recent 09/23/2021. CHF, Previous Myocardial Infarction and CAD;                 Risk Factors:Hypertension and Dyslipidemia.  Sonographer:    Bernadene Person RDCS Referring Phys: [place]  1. Left ventricular ejection fraction, by estimation, is 25 to 30%. The left ventricle has severely decreased function. The left ventricle demonstrates global hypokinesis. There is mild left ventricular hypertrophy. The LV is small and underfilled, the interventricular septum is shifted left. The LVAD speed was serially decreased from 5600 rpm to 5100 rpm. LVAD inflow cannula at apex.  2. Peak RV-RA gradient 28 mmHg. Right ventricular systolic function is moderately reduced. The right ventricular size is severely enlarged.  There is mildly elevated pulmonary artery systolic pressure.  3. The mitral valve is normal in structure. No evidence of mitral valve regurgitation.  No evidence of mitral stenosis.  4. The tricuspid valve is abnormal. Tricuspid valve regurgitation is severe.  5. The aortic valve does not open at any of the evaluated LVAD speeds. The aortic valve is tricuspid. Aortic valve regurgitation is trivial.  6. Left atrial size was moderately dilated.  7. Right atrial size was moderately dilated.  8. Moderate pericardial effusion primarily adjacent to the lateral wall of the left ventricle. No tamponade. Effusion is significantly larger than on the prior echo. There is a left pleural effusion. FINDINGS  Left Ventricle: Left ventricular ejection fraction, by estimation, is 25 to 30%. The left ventricle has severely decreased function. The left ventricle demonstrates global hypokinesis. The left ventricular internal cavity size was small. There is mild left ventricular hypertrophy. Right Ventricle: Peak RV-RA gradient 28 mmHg. The right ventricular size is severely enlarged. Right ventricular systolic function is moderately reduced. There is mildly elevated pulmonary artery systolic pressure. The tricuspid regurgitant velocity is 2.66 m/s, and with an assumed right atrial pressure of 8 mmHg, the estimated right ventricular systolic pressure is 73.5 mmHg. Left Atrium: Left atrial size was moderately dilated. Right Atrium: Right atrial size was moderately dilated. Pericardium: Moderate pericardial effusion primarily adjacent to the lateral wall of the left ventricle. No tamponade. Effusion is significantly larger than on the prior echo. There is a left pleural effusion. Mitral Valve: The mitral valve is normal in structure. No evidence of mitral valve stenosis. Tricuspid Valve: The tricuspid valve is abnormal. Tricuspid valve regurgitation is severe. Aortic Valve: The aortic valve does not open at any of the evaluated LVAD  speeds. The aortic valve is tricuspid. Aortic valve regurgitation is trivial. Pulmonic Valve: The pulmonic valve was normal in structure. Pulmonic valve regurgitation is trivial. Additional Comments: A device lead is visualized in the right ventricle. RIGHT VENTRICLE RV S prime:     7.02 cm/s PULMONIC VALVE RVOT Peak grad: 1 mmHg  TRICUSPID VALVE TR Peak grad:   28.3 mmHg TR Vmax:        266.00 cm/s  SHUNTS Pulmonic VTI: 0.077 m Chayah Mckee McleanMD Electronically signed by Franki Monte Signature Date/Time: 10/02/2021/4:04:34 PM    Final      Medications:     Scheduled Medications:  (feeding supplement) PROSource Plus  30 mL Oral TID WC   sodium chloride   Intravenous Once   sodium chloride   Intravenous Once   sodium chloride   Intravenous Once   sodium chloride   Intravenous Once   sodium chloride   Intravenous Once   acetaminophen  500 mg Oral Q6H   amiodarone  200 mg Oral BID   aspirin  81 mg Oral Daily   Chlorhexidine Gluconate Cloth  6 each Topical Daily   docusate sodium  200 mg Oral Daily   feeding supplement  237 mL Oral TID BM   feeding supplement (KATE FARMS STANDARD 1.4)  325 mL Oral TID BM   mouth rinse  15 mL Mouth Rinse BID   metoCLOPramide (REGLAN) injection  5 mg Intravenous Q8H   midodrine  15 mg Oral TID WC   montelukast  5 mg Oral QHS   multivitamin  1 tablet Oral QHS   pantoprazole  40 mg Oral Daily   polyethylene glycol  17 g Oral Daily   rosuvastatin  10 mg Oral Daily   sodium chloride flush  10-40 mL Intracatheter Q12H   sodium chloride flush  3 mL Intravenous Q12H   warfarin  1 mg Oral q1600  Warfarin - Pharmacist Dosing Inpatient   Does not apply q1600    Infusions:   prismasol BGK 4/2.5 400 mL/hr at 10/03/21 0437    prismasol BGK 4/2.5 200 mL/hr at 10/03/21 0536   sodium chloride Stopped (09/20/21 0304)   sodium chloride Stopped (09/18/21 0803)   sodium chloride Stopped (10/02/21 0900)   epinephrine Stopped (10/01/21 1017)   lactated ringers      milrinone 0.25 mcg/kg/min (10/03/21 0700)   norepinephrine (LEVOPHED) Adult infusion 3 mcg/min (10/03/21 0700)   prismasol BGK 4/2.5 1,800 mL/hr at 10/03/21 0302   promethazine (PHENERGAN) injection (IM or IVPB) Stopped (10/01/21 1714)   sodium phosphate  Dextrose 5% IVPB 45 mL/hr at 10/03/21 0700    PRN Medications: sodium chloride, sodium chloride, albuterol, heparin, heparin, lip balm, morphine injection, ondansetron (ZOFRAN) IV, oxyCODONE, polyethylene glycol, promethazine (PHENERGAN) injection (IM or IVPB), sodium chloride flush, sodium chloride flush, traMADol   Assessment/Plan:    1. Acute on chronic systolic CHF:  Long-standing cardiomyopathy.  [place].  Echo this admission with EF < 20%, severe LV dilation, restrictive diastolic function, moderate RV dysfunction, moderate MR, mod-severe TR. Cause of cardiomyopathy is uncertain.  He has a history of CAD, but I do not think that the described CAD from the past could explain his cardiomyopathy, but CAD could have progressed.  With difficulty tolerating GDMT/need for midodrine and cardiorenal syndrome as well as profound volume overload + NYHA class IV symptoms,  concerned for low output HF. Co-ox off milrinone was 36%, milrinone started and increased to 0.375 but CO remained low. NE added and Impella 5.5 placed 12/23. EF 10% on TEE 12/23. s/p HM III VAD on 12/28.  Ramp echo 01/03 with EF 20-25%, RV moderately reduced, septum mid-line.  Ramp echo again on 1/12 showed severely dilated/moderately dysfunctional RV with severe TR and left-shifted septum with small LV.  Speed decreased to 5000 rpm over 2 sessions due to RV failure and milrinone 0.25 + NE 3 restarted.  Able to increase CVVH, weight down significantly.  MAP 70s today, CVP 12.  Walked around unit.  Getting fluid off should help his RV. Stable LDH.  - Continue milrinone 0.25 mcg/kg/min, may need to go home on this for RV support.  - Continue NE, wean as MAP allows.  -  Continue midodrine 15 mg tid.  - Continue CVVH, aim for about 100 cc/hr net negative UF.    - He does not have BP room for sildenafil.  - Warfarin for INR 2-2.5.  INR 1.9 today  - Continue 81 mg aspirin - Continue UNNA boots 2. Tricuspid regurgitation:  Tricuspid repair not done at time of VAD d/t proximity of ICD wires and hypotension during surgery.  He has severe TR.  3. Atrial fibrillation: Persistent since 10/22 based on device interrogation.  Was in junctional rhythm, now probably NSR. - Continue PO amio 200 bid  4. AKI on CKD stage 3: Gradual rise (was about 1.7 pre-op) since surgery.  He has not been hypotensive post-op, suspect intra-op hypotension led to development of ATN => urine sediment looked like ATN per renal. Now on CVVH. TDC placed 1/7.  - Maintain MAP and CO as above - Pulling for net 100/hr UF.  - Nephrology following.   5. CAD: History of PCI to OM1 in 2007 and RCA in 2013.  No CP or ACs.  - Deferred cardiac cath due to AKI and plan for VAD - Continue Crestor.  6. ID: Had fever 12/21, PCT  1.03.  Blood cultures negative. ?Phlebitis at IV site. Post-op fever with elevated WBCs, PCT 2.63 -> 1.78.  He completed course of cefepime. Afebrile.  7. Constipation: Continue aggressive bowel regimen. improved 8. Hyponatremia: Hypervolemic hyponatremia.  Controlled via CVVH.  9. Anemia: 1 unit PRBCs on 1/12, hgb 7.2 today.  No overt bleeding, ?losses via CVVH.  - Will transfuse 1 unit PRBCs today.  10. Confusion/delirium: Initially post-op and started on Precedex.  This has cleared, now up and has been walking halls 11. F/E/N: Advance diet. Ensure ordered. Encouraged PO intake. Recommend placement of cortrak for TFs  - Cortrack today.  12. Left pleural effusion: - s/p thoracentesis 1/7 - s/p thoracentesis 1/12.   I reviewed the LVAD parameters from today, and compared the results to the patient's prior recorded data.  No programming changes were made.  The LVAD is functioning  within specified parameters.  The patient performs LVAD self-test daily.  LVAD interrogation was negative for any significant power changes, alarms or PI events/speed drops.  LVAD equipment check completed and is in good working order.  Back-up equipment present.   LVAD education done on emergency procedures and precautions and reviewed exit site care.  CRITICAL CARE Performed by: Loralie Champagne  Total critical care time: 40 minutes  Critical care time was exclusive of separately billable procedures and treating other patients.  Critical care was necessary to treat or prevent imminent or life-threatening deterioration.  Critical care was time spent personally by me on the following activities: development of treatment plan with patient and/or surrogate as well as nursing, discussions with consultants, evaluation of patient's response to treatment, examination of patient, obtaining history from patient or surrogate, ordering and performing treatments and interventions, ordering and review of laboratory studies, ordering and review of radiographic studies, pulse oximetry and re-evaluation of patient's condition.  Loralie Champagne 10/03/2021 8:25 AM

## 2021-10-03 NOTE — Progress Notes (Signed)
Orthopedic Tech Progress Note Patient Details:  Isaac [name] 1951-08-27 [id number]  Ortho Devices Type of Ortho Device: Louretta Parma boot Ortho Device/Splint Location: BLE Ortho Device/Splint Interventions: Ordered, Application   Post Interventions Patient Tolerated: Well Instructions Provided: Care of device  Duwan Adrian A Dotsie Gillette 10/03/2021, 10:25 AM

## 2021-10-03 NOTE — Progress Notes (Signed)
[place] for Warfarin  Indication: LVAD/AF  Allergies  Allergen Reactions   Mushroom Extract Complex Nausea And Vomiting   Neosporin [Neomycin-Bacitracin Zn-Polymyx] Hives    Patient Measurements: Height: 5\' 9"  (175.3 cm) Weight: 74.8 kg (164 lb 14.5 oz) IBW/kg (Calculated) : 70.7 Heparin Dosing Weight: 83.7 kg  Vital Signs: Temp: 97.7 F (36.5 C) (01/13 1200) Temp Source: Oral (01/13 1200) BP: 82/68 (01/13 1200) Pulse Rate: 172 (01/13 1200)  Labs: Recent Labs     0000 10/01/21 0345 10/01/21 0927 10/01/21 1532 10/02/21 0415 10/02/21 1400 10/02/21 1722 10/03/21 0406  HGB   < >  --  7.8*  --  7.7* 8.2*  --  7.2*  HCT   < >  --  23.8*  --  24.2* 25.5*  --  22.4*  PLT  --   --  541*  --  576*  --   --  534*  LABPROT  --  25.4*  --   --  24.6*  --   --  21.9*  INR  --  2.3*  --   --  2.2*  --   --  1.9*  CREATININE  --  1.52*  --    < > 1.46*  --  1.43* 1.48*   < > = values in this interval not displayed.     Estimated Creatinine Clearance: 47.1 mL/min (A) (by C-G formula based on SCr of 1.48 mg/dL (H)).   Medical History: Past Medical History:  Diagnosis Date   Arrhythmia    atrial fibrillation   CHF (congestive heart failure) (HCC)    Chronic kidney disease    Coronary artery disease    Hyperlipidemia    Hypertension    Myocardial infarct [facility])     Assessment: 71 yo male on chronic Eliquis PTA for afib.  Now s/p LVAD placement 09/17/21, pharmacy asked to begin anticoagulation with warfarin.   INR has been therapeutic but dropped slightly 1.9 today will boost today, EPW out. LDH  stable a200s-300s and hemoglobin low stable,no overt bleeding noted. Prn PRBCs.  Not eating much now - coretrack placed for TF today  Goal of Therapy:  INR 2-2.5 Monitor platelets by anticoagulation protocol: Yes   Plan:  -Warfarin 2 mg PO x1 today -Daily INR, CBC, LDH -If initiating tube feeds will likely need to increase warfarin  dose.   Bonnita Nasuti Pharm.D. CPP, BCPS Clinical Pharmacist [phone] 10/03/2021 12:32 PM   [facility] pharmacy phone numbers are listed on [email or web address]

## 2021-10-04 ENCOUNTER — Inpatient Hospital Stay (HOSPITAL_COMMUNITY): Payer: Medicare HMO

## 2021-10-04 DIAGNOSIS — Z95811 Presence of heart assist device: Secondary | ICD-10-CM

## 2021-10-04 DIAGNOSIS — R570 Cardiogenic shock: Secondary | ICD-10-CM | POA: Diagnosis not present

## 2021-10-04 DIAGNOSIS — I5022 Chronic systolic (congestive) heart failure: Secondary | ICD-10-CM

## 2021-10-04 LAB — TYPE AND SCREEN
ABO/RH(D): O POS
Antibody Screen: NEGATIVE
Unit division: 0
Unit division: 0

## 2021-10-04 LAB — MAGNESIUM
Magnesium: 2.5 mg/dL — ABNORMAL HIGH (ref 1.7–2.4)
Magnesium: 2.4 mg/dL (ref 1.7–2.4)

## 2021-10-04 LAB — RENAL FUNCTION PANEL
Albumin: 2 g/dL — ABNORMAL LOW (ref 3.5–5.0)
Albumin: 2 g/dL — ABNORMAL LOW (ref 3.5–5.0)
Anion gap: 9 (ref 5–15)
Anion gap: 4 — ABNORMAL LOW (ref 5–15)
BUN: 15 mg/dL (ref 8–23)
BUN: 18 mg/dL (ref 8–23)
CO2: 28 mmol/L (ref 22–32)
CO2: 27 mmol/L (ref 22–32)
Calcium: 8.4 mg/dL — ABNORMAL LOW (ref 8.9–10.3)
Calcium: 7.8 mg/dL — ABNORMAL LOW (ref 8.9–10.3)
Chloride: 100 mmol/L (ref 98–111)
Chloride: 102 mmol/L (ref 98–111)
Creatinine, Ser: 1.34 mg/dL — ABNORMAL HIGH (ref 0.61–1.24)
Creatinine, Ser: 1.42 mg/dL — ABNORMAL HIGH (ref 0.61–1.24)
GFR, Estimated: 57 mL/min — ABNORMAL LOW (ref >60)
GFR, Estimated: 53 mL/min — ABNORMAL LOW (ref >60)
Glucose, Bld: 117 mg/dL — ABNORMAL HIGH (ref 70–99)
Glucose, Bld: 141 mg/dL — ABNORMAL HIGH (ref 70–99)
Phosphorus: 1.9 mg/dL — ABNORMAL LOW (ref 2.5–4.6)
Phosphorus: 1.7 mg/dL — ABNORMAL LOW (ref 2.5–4.6)
Potassium: 4.4 mmol/L (ref 3.5–5.1)
Potassium: 4.9 mmol/L (ref 3.5–5.1)
Sodium: 137 mmol/L (ref 135–145)
Sodium: 133 mmol/L — ABNORMAL LOW (ref 135–145)

## 2021-10-04 LAB — BPAM RBC
Blood Product Expiration Date: 202301252359
Blood Product Expiration Date: 202302032359
ISSUE DATE / TIME: 202301121027
ISSUE DATE / TIME: 202301130914
Unit Type and Rh: 5100
Unit Type and Rh: 5100

## 2021-10-04 LAB — CBC
HCT: 27.3 % — ABNORMAL LOW (ref 39.0–52.0)
Hemoglobin: 8.6 g/dL — ABNORMAL LOW (ref 13.0–17.0)
MCH: 28.7 pg (ref 26.0–34.0)
MCHC: 31.5 g/dL (ref 30.0–36.0)
MCV: 91 fL (ref 80.0–100.0)
Platelets: 488 10*3/uL — ABNORMAL HIGH (ref 150–400)
RBC: 3 MIL/uL — ABNORMAL LOW (ref 4.22–5.81)
RDW: 20.4 % — ABNORMAL HIGH (ref 11.5–15.5)
WBC: 14.5 10*3/uL — ABNORMAL HIGH (ref 4.0–10.5)
nRBC: 0.4 % — ABNORMAL HIGH (ref 0.0–0.2)

## 2021-10-04 LAB — LACTATE DEHYDROGENASE: LDH: 300 U/L — ABNORMAL HIGH (ref 98–192)

## 2021-10-04 LAB — PROTIME-INR
INR: 1.7 — ABNORMAL HIGH (ref 0.8–1.2)
Prothrombin Time: 19.9 seconds — ABNORMAL HIGH (ref 11.4–15.2)

## 2021-10-04 LAB — COOXEMETRY PANEL
Carboxyhemoglobin: 1.7 % — ABNORMAL HIGH (ref 0.5–1.5)
Methemoglobin: 1 % (ref 0.0–1.5)
O2 Saturation: 54.1 %
Total hemoglobin: 8.8 g/dL — ABNORMAL LOW (ref 12.0–16.0)

## 2021-10-04 LAB — GLUCOSE, CAPILLARY
Glucose-Capillary: 141 mg/dL — ABNORMAL HIGH (ref 70–99)
Glucose-Capillary: 125 mg/dL — ABNORMAL HIGH (ref 70–99)
Glucose-Capillary: 132 mg/dL — ABNORMAL HIGH (ref 70–99)
Glucose-Capillary: 124 mg/dL — ABNORMAL HIGH (ref 70–99)

## 2021-10-04 LAB — PHOSPHORUS
Phosphorus: 1.9 mg/dL — ABNORMAL LOW (ref 2.5–4.6)
Phosphorus: 1.7 mg/dL — ABNORMAL LOW (ref 2.5–4.6)

## 2021-10-04 MED ORDER — WARFARIN SODIUM 2 MG PO TABS
2.0000 mg | ORAL_TABLET | Freq: Once | ORAL | Status: AC
  Administered 2021-10-04: 2 mg via ORAL
  Filled 2021-10-04: qty 1

## 2021-10-04 NOTE — Progress Notes (Signed)
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 Acute component either from cardiorenal +/- ATN with decreased renal function from hemodynamics and CPB + hypotensive episodes + pressors.  - CRRT started on 1/3.       - back on NE and milrinone     - UF as tolerated today- continue net negative 100-200 mL/ hr          -Cont monitoring daily I/Os + weights  -Maintain MAP>65 for optimal renal perfusion.  -Avoid nephrotoxic medications including NSAIDs, contrast. - Dose medications for a GFR <15 ml/min and also on CRRT.   Cardiogenic shock: s/p LVAD 09/17/22 (HM 3).   - TTE to assess for RV failure--> RV function down - VAD speed decreased 10/02/21 - on NE and milrinone again - symptomatic improvement.  Co-ox overall improved, went from 60s-50s this AM  CAD with h/o PCI last in 2013 Anemia Afib- on amio Acute hypoxic RF: s/p thoracentesis 1.3 L 10/02/21 Dispo: ICU  Subjective:    Walked again this AM and ate breakfast!  Did well with UF yesterday.   Objective:   BP (!) 135/121    Pulse 60    Temp 97.9 F (36.6 C) (Oral)    Resp 17    Ht 5\' 9"  (1.753 m)    Wt 72.5 kg    SpO2 95%    BMI 23.60 kg/m   Intake/Output Summary (Last 24 hours) at 10/04/2021 0958 Last data filed at 10/04/2021 0800 Gross per 24 hour  Intake 1509.01 ml  Output 6358 ml  Net -4848.99 ml   Weight change: -2.3 kg  Physical Exam: GEN: NAD, A&Ox3 appears tired HEENT: wearing glasses NECK: + JVD to angle of mandible LUNGS: faint basilar crackles on L, none on R CV: LVAD hum ABD: + bowel sounds EXT: 1+ lower extremity edema and also present in the UE, much improved ACCESS: rt IJ TC   Imaging: DG Abd 1 View  Result Date: 10/03/2021 CLINICAL DATA:  Enteric tube placement. EXAM: ABDOMEN - 1 VIEW COMPARISON:  April 18, 2020. FINDINGS: The bowel gas pattern is normal. Distal tip of feeding tube is seen in proximal  stomach. No radio-opaque calculi or other significant radiographic abnormality are seen. IMPRESSION: Distal tip of feeding tube seen in proximal stomach. Electronically Signed   By: Marijo Conception M.D.   On: 10/03/2021 11:39   DG CHEST PORT 1 VIEW  Result Date: 10/04/2021 CLINICAL DATA:  Shortness of breath EXAM: PORTABLE CHEST 1 VIEW COMPARISON:  Previous studies including the examination of 10/03/2021 FINDINGS: Transverse diameter of heart is increased. Left ventricular assist device is noted in place. Pacemaker/defibrillator battery is seen in the left infraclavicular region. Enteric tube is noted with its distal portion in the stomach. There is large caliber central venous catheter with its tip at the junction of superior vena cava and right atrium. Distal course of right PICC line is seen in the region of superior vena cava. There is interval decrease in pulmonary vascular congestion. There is interval improvement in aeration of mid and lower lung fields suggesting resolving pulmonary edema. There is increased density in the left lower lung fields which may be due to pleural effusion and possibly underlying atelectasis/pneumonia. There is no pneumothorax. IMPRESSION: Cardiomegaly. There is interval decrease in pulmonary vascular congestion and resolution of pulmonary edema. Increased density in the left lower lung fields may be  due to pleural effusion and possibly underlying infiltrate. Electronically Signed   By: Elmer Picker M.D.   On: 10/04/2021 08:53   DG CHEST PORT 1 VIEW  Result Date: 10/03/2021 CLINICAL DATA:  Shortness of breath. EXAM: PORTABLE CHEST 1 VIEW COMPARISON:  October 02, 2021. FINDINGS: Stable cardiomegaly. Left ventricular assist device is unchanged. Left-sided defibrillator is unchanged. Right internal jugular catheter is unchanged. Feeding tube is seen entering stomach. Bibasilar atelectasis or edema is noted with associated pleural effusions. Bony thorax is unremarkable.  IMPRESSION: Interval placement of feeding tube seen entering stomach. Otherwise stable support apparatus. Stable bibasilar opacities are noted. Electronically Signed   By: Marijo Conception M.D.   On: 10/03/2021 11:39   DG CHEST PORT 1 VIEW  Result Date: 10/02/2021 CLINICAL DATA:  Status post thoracentesis. EXAM: PORTABLE CHEST 1 VIEW COMPARISON:  Chest x-ray from same day. FINDINGS: Unchanged left chest wall pacemaker and tunneled right internal jugular dialysis catheter. Unchanged right upper extremity PICC line. Stable cardiomegaly with LVAD in place. No significant residual left pleural effusion status post thoracentesis. No pneumothorax. Unchanged hazy density at the right lung base likely reflecting layering small pleural effusion and atelectasis. No acute osseous abnormality. IMPRESSION: 1. No significant residual left pleural effusion status post thoracentesis. No pneumothorax. Electronically Signed   By: Titus Dubin M.D.   On: 10/02/2021 17:13   DG CHEST PORT 1 VIEW  Result Date: 10/02/2021 CLINICAL DATA:  Shortness of breath EXAM: PORTABLE CHEST 1 VIEW COMPARISON:  09/27/2021, 09/25/2021 FINDINGS: Post sternotomy changes. Left-sided pacing device as before. Partially visualized LVAD. Right-sided central venous catheter with tip over the right atrium. Cardiomegaly with vascular congestion and worsening pulmonary edema. At least moderate left pleural effusion with suspected right pleural effusion. Basilar airspace disease. IMPRESSION: 1. Cardiomegaly with vascular congestion and worsening pulmonary edema. Increased left greater than right pleural effusions, effusion on the left is at least moderate in size. 2. Basilar consolidations either due to atelectasis or pneumonia Electronically Signed   By: Donavan Foil M.D.   On: 10/02/2021 15:51   DG Abd Portable 1V  Result Date: 10/03/2021 CLINICAL DATA:  Feeding tube placement EXAM: PORTABLE ABDOMEN - 1 VIEW COMPARISON:  10/03/2021 FINDINGS:  Feeding tube with the tip projecting along the mid abdomen which may be coiled within a distended stomach versus duodenum. No bowel dilatation to suggest obstruction. No evidence of pneumoperitoneum, portal venous gas or pneumatosis. No pathologic calcifications along the expected course of the ureters. No acute osseous abnormality. IMPRESSION: Feeding tube with the tip projecting along the mid abdomen which may be coiled within a distended stomach versus duodenum. Electronically Signed   By: Kathreen Devoid M.D.   On: 10/03/2021 12:19   ECHOCARDIOGRAM LIMITED  Result Date: 10/02/2021    ECHOCARDIOGRAM LIMITED REPORT   Patient Name:   Isaac [name] Date of Exam: 10/02/2021 Medical Rec #:  [id number]           Height:       69.0 in Accession #:    [id number]          Weight:       177.2 lb Date of Birth:  07/18/1951            BSA:          1.963 m Patient Age:    71 years            BP:           75/54 mmHg Patient  Gender: M                   HR:           64 bpm. Exam Location:  Inpatient Procedure: Limited Echo, Limited Color Doppler and Cardiac Doppler Indications:    CHF-Acute Systolic T62.56  History:        Patient has prior history of Echocardiogram examinations, most                 recent 10/02/2021. CHF, Previous Myocardial Infarction and CAD;                 Risk Factors:Hypertension and Dyslipidemia.  Sonographer:    Bernadene Person RDCS Referring Phys: [place]  1. Left ventricular ejection fraction, by estimation, is 25 to 30%. The left ventricle has severely decreased function. The left ventricle demonstrates global hypokinesis. There is mild left ventricular hypertrophy. The interventricular septum remains shifted to the left but less than on earlier echo today. The LV cavity is also larger. LVAD inflow cannula at apex. The LVAD speed was decreased to 5000 rpm.  2. Peak RV-RA gradient 34 mmHg. Right ventricular systolic function is moderately reduced. The right ventricular  size is severely enlarged. There is mildly elevated pulmonary artery systolic pressure.  3. Left atrial size was moderately dilated.  4. Right atrial size was moderately dilated.  5. The tricuspid valve is abnormal. Tricuspid valve regurgitation is severe.  6. The aortic valve does not open. The aortic valve is tricuspid. Aortic valve regurgitation is trivial.  7. The mitral valve is normal in structure. No evidence of mitral valve regurgitation. No evidence of mitral stenosis.  8. There is a left pleural effusion. There is a moderate pericardial effusion primarily along the lateral LV wall. This is significantly larger than on previous echo. There is no tamponade. FINDINGS  Left Ventricle: Left ventricular ejection fraction, by estimation, is 25 to 30%. The left ventricle has severely decreased function. The left ventricle demonstrates global hypokinesis. The left ventricular internal cavity size was normal in size. There is mild left ventricular hypertrophy. Right Ventricle: Peak RV-RA gradient 34 mmHg. The right ventricular size is severely enlarged. No increase in right ventricular wall thickness. Right ventricular systolic function is moderately reduced. There is mildly elevated pulmonary artery systolic pressure. The tricuspid regurgitant velocity is 2.90 m/s, and with an assumed right atrial pressure of 8 mmHg, the estimated right ventricular systolic pressure is 38.9 mmHg. Left Atrium: Left atrial size was moderately dilated. Right Atrium: Right atrial size was moderately dilated. Pericardium: There is a left pleural effusion. There is a moderate pericardial effusion primarily along the lateral LV wall. This is significantly larger than on previous echo. There is no tamponade. Mitral Valve: The mitral valve is normal in structure. No evidence of mitral valve stenosis. Tricuspid Valve: The tricuspid valve is abnormal. Tricuspid valve regurgitation is severe. Aortic Valve: The aortic valve does not open. The  aortic valve is tricuspid. Aortic valve regurgitation is trivial. Pulmonic Valve: The pulmonic valve was normal in structure. Pulmonic valve regurgitation is trivial. Additional Comments: A device lead is visualized in the right ventricle. LEFT VENTRICLE PLAX 2D LVIDd:         3.40 cm  RIGHT VENTRICLE RV S prime:     7.56 cm/s PULMONIC VALVE RVOT Peak grad: 1 mmHg  TRICUSPID VALVE TR Peak grad:   33.6 mmHg TR Vmax:        290.00 cm/s  SHUNTS Pulmonic VTI: 0.069 m Franki Monte Electronically signed by Franki Monte Signature Date/Time: 10/02/2021/4:08:36 PM    Final    ECHOCARDIOGRAM LIMITED  Result Date: 10/02/2021    ECHOCARDIOGRAM LIMITED REPORT   Patient Name:   WYLEE DORANTES Date of Exam: 10/02/2021 Medical Rec #:  [id number]           Height:       69.0 in Accession #:    [id number]          Weight:       177.2 lb Date of Birth:  12/24/1950            BSA:          1.963 m Patient Age:    31 years            BP:           84/74 mmHg Patient Gender: M                   HR:           62 bpm. Exam Location:  Inpatient Procedure: Limited Echo, Cardiac Doppler and Color Doppler Indications:    CHF-Acute Systolic P53.61  History:        Patient has prior history of Echocardiogram examinations, most                 recent 09/23/2021. CHF, Previous Myocardial Infarction and CAD;                 Risk Factors:Hypertension and Dyslipidemia.  Sonographer:    Bernadene Person RDCS Referring Phys: [place]  1. Left ventricular ejection fraction, by estimation, is 25 to 30%. The left ventricle has severely decreased function. The left ventricle demonstrates global hypokinesis. There is mild left ventricular hypertrophy. The LV is small and underfilled, the interventricular septum is shifted left. The LVAD speed was serially decreased from 5600 rpm to 5100 rpm. LVAD inflow cannula at apex.  2. Peak RV-RA gradient 28 mmHg. Right ventricular systolic function is moderately reduced. The right  ventricular size is severely enlarged. There is mildly elevated pulmonary artery systolic pressure.  3. The mitral valve is normal in structure. No evidence of mitral valve regurgitation. No evidence of mitral stenosis.  4. The tricuspid valve is abnormal. Tricuspid valve regurgitation is severe.  5. The aortic valve does not open at any of the evaluated LVAD speeds. The aortic valve is tricuspid. Aortic valve regurgitation is trivial.  6. Left atrial size was moderately dilated.  7. Right atrial size was moderately dilated.  8. Moderate pericardial effusion primarily adjacent to the lateral wall of the left ventricle. No tamponade. Effusion is significantly larger than on the prior echo. There is a left pleural effusion. FINDINGS  Left Ventricle: Left ventricular ejection fraction, by estimation, is 25 to 30%. The left ventricle has severely decreased function. The left ventricle demonstrates global hypokinesis. The left ventricular internal cavity size was small. There is mild left ventricular hypertrophy. Right Ventricle: Peak RV-RA gradient 28 mmHg. The right ventricular size is severely enlarged. Right ventricular systolic function is moderately reduced. There is mildly elevated pulmonary artery systolic pressure. The tricuspid regurgitant velocity is 2.66 m/s, and with an assumed right atrial pressure of 8 mmHg, the estimated right ventricular systolic pressure is 44.3 mmHg. Left Atrium: Left atrial size was moderately dilated. Right Atrium: Right atrial size was moderately dilated. Pericardium: Moderate pericardial effusion primarily adjacent to the lateral  wall of the left ventricle. No tamponade. Effusion is significantly larger than on the prior echo. There is a left pleural effusion. Mitral Valve: The mitral valve is normal in structure. No evidence of mitral valve stenosis. Tricuspid Valve: The tricuspid valve is abnormal. Tricuspid valve regurgitation is severe. Aortic Valve: The aortic valve does not  open at any of the evaluated LVAD speeds. The aortic valve is tricuspid. Aortic valve regurgitation is trivial. Pulmonic Valve: The pulmonic valve was normal in structure. Pulmonic valve regurgitation is trivial. Additional Comments: A device lead is visualized in the right ventricle. RIGHT VENTRICLE RV S prime:     7.02 cm/s PULMONIC VALVE RVOT Peak grad: 1 mmHg  TRICUSPID VALVE TR Peak grad:   28.3 mmHg TR Vmax:        266.00 cm/s  SHUNTS Pulmonic VTI: 0.077 m Franki Monte Electronically signed by Franki Monte Signature Date/Time: 10/02/2021/4:04:34 PM    Final     Labs: BMET Recent Labs  Lab 10/01/21 0345 10/01/21 1532 10/02/21 0415 10/02/21 1722 10/03/21 0406 10/03/21 1620 10/04/21 0413  NA 136 135 136 134* 134* 136 137  K 4.1 4.7 4.5 4.3 4.4 4.0 4.4  CL 103 102 101 102 102 102 100  CO2 24 25 25 25 27 26 28   GLUCOSE 101* 98 72 171* 193* 149* 117*  BUN 16 18 15 16 17 17 15   CREATININE 1.52* 1.50* 1.46* 1.43* 1.48* 1.39* 1.34*  CALCIUM 7.9* 8.0* 8.2* 7.8* 7.9* 7.6* 8.4*  PHOS 2.0* 2.3* 1.9* 1.7* <1.0* 2.3* 1.9*   1.9*   CBC Recent Labs  Lab 09/28/21 1951 09/29/21 0311 10/01/21 0927 10/02/21 0415 10/02/21 1400 10/03/21 0406 10/04/21 0413  WBC 14.2*   < > 13.2* 13.2*  --  16.2* 14.5*  NEUTROABS 11.5*  --   --   --   --   --   --   HGB 7.7*   < > 7.8* 7.7* 8.2* 7.2* 8.6*  HCT 23.7*   < > 23.8* 24.2* 25.5* 22.4* 27.3*  MCV 86.5   < > 87.5 90.3  --  89.2 91.0  PLT 475*   < > 541* 576*  --  534* 488*   < > = values in this interval not displayed.    Medications:     sodium chloride   Intravenous Once   sodium chloride   Intravenous Once   sodium chloride   Intravenous Once   sodium chloride   Intravenous Once   sodium chloride   Intravenous Once   acetaminophen  500 mg Oral Q6H   amiodarone  200 mg Oral BID   aspirin  81 mg Oral Daily   Chlorhexidine Gluconate Cloth  6 each Topical Daily   docusate  200 mg Per Tube Daily   feeding supplement  237 mL Oral TID BM    mouth rinse  15 mL Mouth Rinse BID   metoCLOPramide (REGLAN) injection  5 mg Intravenous Q8H   midodrine  15 mg Oral TID WC   montelukast  5 mg Oral QHS   multivitamin  1 tablet Oral QHS   pantoprazole  40 mg Oral Daily   polyethylene glycol  17 g Oral Daily   rosuvastatin  10 mg Oral Daily   sodium chloride flush  10-40 mL Intracatheter Q12H   sodium chloride flush  3 mL Intravenous Q12H   Warfarin - Pharmacist Dosing Inpatient   Does not apply q1600      Madelon Lips, MD 10/04/2021, 9:58  AM  ° °

## 2021-10-04 NOTE — Progress Notes (Signed)
Patient ID: Isaac [name], male   DOB: September 28, 1950, 71 y.o.   MRN: [id number] HeartMate 3 Rounding Note  Subjective:    Feels better. Ambulated around the ICU this am. Eating some.  Co-ox 54.1 on milrinone 0.25 and NE 3  -4600 cc yesterday and wt now down to 160 ( 13 lbs below preop)     LVAD INTERROGATION:  HeartMate IIl LVAD:  Flow 4.5 liters/min, speed 5000, power 3.3, PI 3.4.    Objective:    Vital Signs:   Temp:  [97.7 F (36.5 C)-98.1 F (36.7 C)] 97.9 F (36.6 C) (01/14 0800) Pulse Rate:  [60-195] 60 (01/14 0800) Resp:  [11-26] 17 (01/14 0800) BP: (74-135)/(46-121) 135/121 (01/14 0800) SpO2:  [77 %-100 %] 95 % (01/14 0800) Weight:  [72.5 kg] 72.5 kg (01/14 0630) Last BM Date: 10/03/21 Mean arterial Pressure 80's   Intake/Output:   Intake/Output Summary (Last 24 hours) at 10/04/2021 0950 Last data filed at 10/04/2021 0800 Gross per 24 hour  Intake 1509.01 ml  Output 6358 ml  Net -4848.99 ml     Physical Exam: General:  Looks fatigued . No resp difficulty HEENT: normal. Facial edema resolved Neck: normal Cor:  LVAD hum present. Lungs: decreased BS left base Abdomen: soft, nontender, nondistended.  Good bowel sounds. Extremities: mild edema in lower legs in feet. Unna boot on. Neuro: alert & oriented x 3.  Affect pleasant  Telemetry: sinus  Labs: Basic Metabolic Panel: Recent Labs  Lab 10/01/21 0345 10/01/21 1532 10/02/21 0415 10/02/21 1722 10/03/21 0406 10/03/21 1620 10/04/21 0413  NA 136   < > 136 134* 134* 136 137  K 4.1   < > 4.5 4.3 4.4 4.0 4.4  CL 103   < > 101 102 102 102 100  CO2 24   < > 25 25 27 26 28   GLUCOSE 101*   < > 72 171* 193* 149* 117*  BUN 16   < > 15 16 17 17 15   CREATININE 1.52*   < > 1.46* 1.43* 1.48* 1.39* 1.34*  CALCIUM 7.9*   < > 8.2* 7.8* 7.9* 7.6* 8.4*  MG 2.3  --  2.4  --  2.4 2.2 2.5*  PHOS 2.0*   < > 1.9* 1.7* <1.0* 2.3* 1.9*   1.9*   < > = values in this interval not displayed.    Liver Function  Tests: Recent Labs  Lab 10/02/21 0415 10/02/21 1722 10/03/21 0406 10/03/21 1620 10/04/21 0413  ALBUMIN 1.9* 1.9* 1.9* 1.9* 2.0*   No results for input(s): LIPASE, AMYLASE in the last 168 hours. No results for input(s): AMMONIA in the last 168 hours.  CBC: Recent Labs  Lab 09/28/21 1951 09/29/21 0311 09/30/21 0941 10/01/21 0927 10/02/21 0415 10/02/21 1400 10/03/21 0406 10/04/21 0413  WBC 14.2*   < > 14.1* 13.2* 13.2*  --  16.2* 14.5*  NEUTROABS 11.5*  --   --   --   --   --   --   --   HGB 7.7*   < > 8.2* 7.8* 7.7* 8.2* 7.2* 8.6*  HCT 23.7*   < > 24.7* 23.8* 24.2* 25.5* 22.4* 27.3*  MCV 86.5   < > 86.7 87.5 90.3  --  89.2 91.0  PLT 475*   < > 519* 541* 576*  --  534* 488*   < > = values in this interval not displayed.    INR: Recent Labs  Lab 09/30/21 0317 10/01/21 0345 10/02/21 0415 10/03/21 0406 10/04/21 0413  INR 2.5* 2.3* 2.2* 1.9* 1.7*    Other results: EKG:   Imaging: DG Abd 1 View  Result Date: 10/03/2021 CLINICAL DATA:  Enteric tube placement. EXAM: ABDOMEN - 1 VIEW COMPARISON:  April 18, 2020. FINDINGS: The bowel gas pattern is normal. Distal tip of feeding tube is seen in proximal stomach. No radio-opaque calculi or other significant radiographic abnormality are seen. IMPRESSION: Distal tip of feeding tube seen in proximal stomach. Electronically Signed   By: Marijo Conception M.D.   On: 10/03/2021 11:39   DG CHEST PORT 1 VIEW  Result Date: 10/04/2021 CLINICAL DATA:  Shortness of breath EXAM: PORTABLE CHEST 1 VIEW COMPARISON:  Previous studies including the examination of 10/03/2021 FINDINGS: Transverse diameter of heart is increased. Left ventricular assist device is noted in place. Pacemaker/defibrillator battery is seen in the left infraclavicular region. Enteric tube is noted with its distal portion in the stomach. There is large caliber central venous catheter with its tip at the junction of superior vena cava and right atrium. Distal course of right  PICC line is seen in the region of superior vena cava. There is interval decrease in pulmonary vascular congestion. There is interval improvement in aeration of mid and lower lung fields suggesting resolving pulmonary edema. There is increased density in the left lower lung fields which may be due to pleural effusion and possibly underlying atelectasis/pneumonia. There is no pneumothorax. IMPRESSION: Cardiomegaly. There is interval decrease in pulmonary vascular congestion and resolution of pulmonary edema. Increased density in the left lower lung fields may be due to pleural effusion and possibly underlying infiltrate. Electronically Signed   By: Elmer Picker M.D.   On: 10/04/2021 08:53   DG CHEST PORT 1 VIEW  Result Date: 10/03/2021 CLINICAL DATA:  Shortness of breath. EXAM: PORTABLE CHEST 1 VIEW COMPARISON:  October 02, 2021. FINDINGS: Stable cardiomegaly. Left ventricular assist device is unchanged. Left-sided defibrillator is unchanged. Right internal jugular catheter is unchanged. Feeding tube is seen entering stomach. Bibasilar atelectasis or edema is noted with associated pleural effusions. Bony thorax is unremarkable. IMPRESSION: Interval placement of feeding tube seen entering stomach. Otherwise stable support apparatus. Stable bibasilar opacities are noted. Electronically Signed   By: Marijo Conception M.D.   On: 10/03/2021 11:39   DG CHEST PORT 1 VIEW  Result Date: 10/02/2021 CLINICAL DATA:  Status post thoracentesis. EXAM: PORTABLE CHEST 1 VIEW COMPARISON:  Chest x-ray from same day. FINDINGS: Unchanged left chest wall pacemaker and tunneled right internal jugular dialysis catheter. Unchanged right upper extremity PICC line. Stable cardiomegaly with LVAD in place. No significant residual left pleural effusion status post thoracentesis. No pneumothorax. Unchanged hazy density at the right lung base likely reflecting layering small pleural effusion and atelectasis. No acute osseous  abnormality. IMPRESSION: 1. No significant residual left pleural effusion status post thoracentesis. No pneumothorax. Electronically Signed   By: Titus Dubin M.D.   On: 10/02/2021 17:13   DG CHEST PORT 1 VIEW  Result Date: 10/02/2021 CLINICAL DATA:  Shortness of breath EXAM: PORTABLE CHEST 1 VIEW COMPARISON:  09/27/2021, 09/25/2021 FINDINGS: Post sternotomy changes. Left-sided pacing device as before. Partially visualized LVAD. Right-sided central venous catheter with tip over the right atrium. Cardiomegaly with vascular congestion and worsening pulmonary edema. At least moderate left pleural effusion with suspected right pleural effusion. Basilar airspace disease. IMPRESSION: 1. Cardiomegaly with vascular congestion and worsening pulmonary edema. Increased left greater than right pleural effusions, effusion on the left is at least moderate in size. 2. Basilar  consolidations either due to atelectasis or pneumonia Electronically Signed   By: Donavan Foil M.D.   On: 10/02/2021 15:51   DG Abd Portable 1V  Result Date: 10/03/2021 CLINICAL DATA:  Feeding tube placement EXAM: PORTABLE ABDOMEN - 1 VIEW COMPARISON:  10/03/2021 FINDINGS: Feeding tube with the tip projecting along the mid abdomen which may be coiled within a distended stomach versus duodenum. No bowel dilatation to suggest obstruction. No evidence of pneumoperitoneum, portal venous gas or pneumatosis. No pathologic calcifications along the expected course of the ureters. No acute osseous abnormality. IMPRESSION: Feeding tube with the tip projecting along the mid abdomen which may be coiled within a distended stomach versus duodenum. Electronically Signed   By: Kathreen Devoid M.D.   On: 10/03/2021 12:19   ECHOCARDIOGRAM LIMITED  Result Date: 10/02/2021    ECHOCARDIOGRAM LIMITED REPORT   Patient Name:   Isaac [name] Date of Exam: 10/02/2021 Medical Rec #:  [id number]           Height:       69.0 in Accession #:    [id number]          Weight:        177.2 lb Date of Birth:  May 07, 1951            BSA:          1.963 m Patient Age:    39 years            BP:           75/54 mmHg Patient Gender: M                   HR:           64 bpm. Exam Location:  Inpatient Procedure: Limited Echo, Limited Color Doppler and Cardiac Doppler Indications:    CHF-Acute Systolic O87.86  History:        Patient has prior history of Echocardiogram examinations, most                 recent 10/02/2021. CHF, Previous Myocardial Infarction and CAD;                 Risk Factors:Hypertension and Dyslipidemia.  Sonographer:    Bernadene Person RDCS Referring Phys: [place]  1. Left ventricular ejection fraction, by estimation, is 25 to 30%. The left ventricle has severely decreased function. The left ventricle demonstrates global hypokinesis. There is mild left ventricular hypertrophy. The interventricular septum remains shifted to the left but less than on earlier echo today. The LV cavity is also larger. LVAD inflow cannula at apex. The LVAD speed was decreased to 5000 rpm.  2. Peak RV-RA gradient 34 mmHg. Right ventricular systolic function is moderately reduced. The right ventricular size is severely enlarged. There is mildly elevated pulmonary artery systolic pressure.  3. Left atrial size was moderately dilated.  4. Right atrial size was moderately dilated.  5. The tricuspid valve is abnormal. Tricuspid valve regurgitation is severe.  6. The aortic valve does not open. The aortic valve is tricuspid. Aortic valve regurgitation is trivial.  7. The mitral valve is normal in structure. No evidence of mitral valve regurgitation. No evidence of mitral stenosis.  8. There is a left pleural effusion. There is a moderate pericardial effusion primarily along the lateral LV wall. This is significantly larger than on previous echo. There is no tamponade. FINDINGS  Left Ventricle: Left ventricular ejection fraction, by estimation, is 25  to 30%. The left ventricle has  severely decreased function. The left ventricle demonstrates global hypokinesis. The left ventricular internal cavity size was normal in size. There is mild left ventricular hypertrophy. Right Ventricle: Peak RV-RA gradient 34 mmHg. The right ventricular size is severely enlarged. No increase in right ventricular wall thickness. Right ventricular systolic function is moderately reduced. There is mildly elevated pulmonary artery systolic pressure. The tricuspid regurgitant velocity is 2.90 m/s, and with an assumed right atrial pressure of 8 mmHg, the estimated right ventricular systolic pressure is 26.9 mmHg. Left Atrium: Left atrial size was moderately dilated. Right Atrium: Right atrial size was moderately dilated. Pericardium: There is a left pleural effusion. There is a moderate pericardial effusion primarily along the lateral LV wall. This is significantly larger than on previous echo. There is no tamponade. Mitral Valve: The mitral valve is normal in structure. No evidence of mitral valve stenosis. Tricuspid Valve: The tricuspid valve is abnormal. Tricuspid valve regurgitation is severe. Aortic Valve: The aortic valve does not open. The aortic valve is tricuspid. Aortic valve regurgitation is trivial. Pulmonic Valve: The pulmonic valve was normal in structure. Pulmonic valve regurgitation is trivial. Additional Comments: A device lead is visualized in the right ventricle. LEFT VENTRICLE PLAX 2D LVIDd:         3.40 cm  RIGHT VENTRICLE RV S prime:     7.56 cm/s PULMONIC VALVE RVOT Peak grad: 1 mmHg  TRICUSPID VALVE TR Peak grad:   33.6 mmHg TR Vmax:        290.00 cm/s  SHUNTS Pulmonic VTI: 0.069 m Dalton McleanMD Electronically signed by Franki Monte Signature Date/Time: 10/02/2021/4:08:36 PM    Final    ECHOCARDIOGRAM LIMITED  Result Date: 10/02/2021    ECHOCARDIOGRAM LIMITED REPORT   Patient Name:   Isaac [name] Date of Exam: 10/02/2021 Medical Rec #:  [id number]           Height:       69.0 in  Accession #:    [id number]          Weight:       177.2 lb Date of Birth:  04-May-1951            BSA:          1.963 m Patient Age:    30 years            BP:           84/74 mmHg Patient Gender: M                   HR:           62 bpm. Exam Location:  Inpatient Procedure: Limited Echo, Cardiac Doppler and Color Doppler Indications:    CHF-Acute Systolic H37.16  History:        Patient has prior history of Echocardiogram examinations, most                 recent 09/23/2021. CHF, Previous Myocardial Infarction and CAD;                 Risk Factors:Hypertension and Dyslipidemia.  Sonographer:    Bernadene Person RDCS Referring Phys: [place]  1. Left ventricular ejection fraction, by estimation, is 25 to 30%. The left ventricle has severely decreased function. The left ventricle demonstrates global hypokinesis. There is mild left ventricular hypertrophy. The LV is small and underfilled, the interventricular septum is shifted left. The LVAD speed was serially  decreased from 5600 rpm to 5100 rpm. LVAD inflow cannula at apex.  2. Peak RV-RA gradient 28 mmHg. Right ventricular systolic function is moderately reduced. The right ventricular size is severely enlarged. There is mildly elevated pulmonary artery systolic pressure.  3. The mitral valve is normal in structure. No evidence of mitral valve regurgitation. No evidence of mitral stenosis.  4. The tricuspid valve is abnormal. Tricuspid valve regurgitation is severe.  5. The aortic valve does not open at any of the evaluated LVAD speeds. The aortic valve is tricuspid. Aortic valve regurgitation is trivial.  6. Left atrial size was moderately dilated.  7. Right atrial size was moderately dilated.  8. Moderate pericardial effusion primarily adjacent to the lateral wall of the left ventricle. No tamponade. Effusion is significantly larger than on the prior echo. There is a left pleural effusion. FINDINGS  Left Ventricle: Left ventricular ejection  fraction, by estimation, is 25 to 30%. The left ventricle has severely decreased function. The left ventricle demonstrates global hypokinesis. The left ventricular internal cavity size was small. There is mild left ventricular hypertrophy. Right Ventricle: Peak RV-RA gradient 28 mmHg. The right ventricular size is severely enlarged. Right ventricular systolic function is moderately reduced. There is mildly elevated pulmonary artery systolic pressure. The tricuspid regurgitant velocity is 2.66 m/s, and with an assumed right atrial pressure of 8 mmHg, the estimated right ventricular systolic pressure is 85.2 mmHg. Left Atrium: Left atrial size was moderately dilated. Right Atrium: Right atrial size was moderately dilated. Pericardium: Moderate pericardial effusion primarily adjacent to the lateral wall of the left ventricle. No tamponade. Effusion is significantly larger than on the prior echo. There is a left pleural effusion. Mitral Valve: The mitral valve is normal in structure. No evidence of mitral valve stenosis. Tricuspid Valve: The tricuspid valve is abnormal. Tricuspid valve regurgitation is severe. Aortic Valve: The aortic valve does not open at any of the evaluated LVAD speeds. The aortic valve is tricuspid. Aortic valve regurgitation is trivial. Pulmonic Valve: The pulmonic valve was normal in structure. Pulmonic valve regurgitation is trivial. Additional Comments: A device lead is visualized in the right ventricle. RIGHT VENTRICLE RV S prime:     7.02 cm/s PULMONIC VALVE RVOT Peak grad: 1 mmHg  TRICUSPID VALVE TR Peak grad:   28.3 mmHg TR Vmax:        266.00 cm/s  SHUNTS Pulmonic VTI: 0.077 m Dalton McleanMD Electronically signed by Franki Monte Signature Date/Time: 10/02/2021/4:04:34 PM    Final      Medications:     Scheduled Medications:  sodium chloride   Intravenous Once   sodium chloride   Intravenous Once   sodium chloride   Intravenous Once   sodium chloride   Intravenous Once    sodium chloride   Intravenous Once   acetaminophen  500 mg Oral Q6H   amiodarone  200 mg Oral BID   aspirin  81 mg Oral Daily   Chlorhexidine Gluconate Cloth  6 each Topical Daily   docusate  200 mg Per Tube Daily   feeding supplement  237 mL Oral TID BM   mouth rinse  15 mL Mouth Rinse BID   metoCLOPramide (REGLAN) injection  5 mg Intravenous Q8H   midodrine  15 mg Oral TID WC   montelukast  5 mg Oral QHS   multivitamin  1 tablet Oral QHS   pantoprazole  40 mg Oral Daily   polyethylene glycol  17 g Oral Daily   rosuvastatin  10 mg  Oral Daily   sodium chloride flush  10-40 mL Intracatheter Q12H   sodium chloride flush  3 mL Intravenous Q12H   Warfarin - Pharmacist Dosing Inpatient   Does not apply q1600    Infusions:   prismasol BGK 4/2.5 400 mL/hr at 10/04/21 0322    prismasol BGK 4/2.5 200 mL/hr at 10/03/21 2211   sodium chloride Stopped (09/20/21 0304)   sodium chloride Stopped (09/18/21 0803)   sodium chloride Stopped (10/02/21 0900)   epinephrine Stopped (10/01/21 1017)   feeding supplement (PIVOT 1.5 CAL) 30 mL/hr at 10/04/21 0800   lactated ringers     milrinone 0.25 mcg/kg/min (10/04/21 0800)   norepinephrine (LEVOPHED) Adult infusion 3 mcg/min (10/04/21 0800)   prismasol BGK 4/2.5 1,800 mL/hr at 10/04/21 0309   promethazine (PHENERGAN) injection (IM or IVPB) Stopped (10/01/21 1714)    PRN Medications: sodium chloride, sodium chloride, albuterol, heparin, heparin, lip balm, morphine injection, ondansetron (ZOFRAN) IV, oxyCODONE, polyethylene glycol, promethazine (PHENERGAN) injection (IM or IVPB), sodium chloride flush, sodium chloride flush, traMADol   Assessment:   POD 17 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 Moderate RV systolic dysfunction by echo preop but looked better after LVAD insertion in OR. Acute RV decompensation two days ago probably due to too  much LV emptying with shift of septum and weaning off milrinone with worsening RV function and severe TR. He improved with turning down speed to 5000 and restarting milrinone and NE. Since that have been able to remove a lot of volume with CRRT. 2.   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 Decided against removal of ICD/pacer and TV replacement. Improved a little at the end of surgery. 3.  Hx of atrial fib on amio. Has been in and out postop. 4.   Stage 3 CKD with postop ATN and anuric renal failure.  CRRT started for volume removal.  5.   CAD with PCI of OM1 in 2007 and RCA in 2013. 6.   Acute postop blood loss anemia. stable 7.   Left pleural effusion and LLL compressive atelectasis. s/p left thoracentesis x 2 8.   Low grade fevers and elevated procalcitonin with WBC trending up. BC and UC negative.  Completed  empiric antibiotics. 9.   Severe malnutrition: on tube feeds per [facility] in addition to po diet.    Plan/Discussion:    He is hemodynamically stable and have been able to remove a lot of the extra volume since turning down speed and resuming inotropes. Hopefully this will improve RV function and reduce TR so that eventually RV can remain compensated off inotrope.  Continuing CRRT to remove 100/hr.  INR 1.7. Coumadin per pharmacy.  Continue IS, ambulation  I reviewed the LVAD parameters from today, and compared the results to the patient's prior recorded data.  No programming changes were made.  The LVAD is functioning within specified parameters. Length of Stay: [address]  Fernande Boyden [facility] 10/04/2021, 9:50 AM

## 2021-10-04 NOTE — Progress Notes (Signed)
[place] for Warfarin  Indication: LVAD/AF  Allergies  Allergen Reactions   Mushroom Extract Complex Nausea And Vomiting   Neosporin [Neomycin-Bacitracin Zn-Polymyx] Hives    Patient Measurements: Height: 5\' 9"  (175.3 cm) Weight: 72.5 kg (159 lb 13.3 oz) IBW/kg (Calculated) : 70.7 Heparin Dosing Weight: 83.7 kg  Vital Signs: Temp: 98 F (36.7 C) (01/14 1200) Temp Source: Oral (01/14 1200) BP: 105/56 (01/14 1200) Pulse Rate: 80 (01/14 1200)  Labs: Recent Labs    10/02/21 0415 10/02/21 1400 10/02/21 1722 10/03/21 0406 10/03/21 1620 10/04/21 0413  HGB 7.7* 8.2*  --  7.2*  --  8.6*  HCT 24.2* 25.5*  --  22.4*  --  27.3*  PLT 576*  --   --  534*  --  488*  LABPROT 24.6*  --   --  21.9*  --  19.9*  INR 2.2*  --   --  1.9*  --  1.7*  CREATININE 1.46*  --    < > 1.48* 1.39* 1.34*   < > = values in this interval not displayed.     Estimated Creatinine Clearance: 52 mL/min (A) (by C-G formula based on SCr of 1.34 mg/dL (H)).   Medical History: Past Medical History:  Diagnosis Date   Arrhythmia    atrial fibrillation   CHF (congestive heart failure) (HCC)    Chronic kidney disease    Coronary artery disease    Hyperlipidemia    Hypertension    Myocardial infarct [facility])     Assessment: 71 yo male on chronic Eliquis PTA for afib.  Now s/p LVAD placement 09/17/21, pharmacy asked to begin anticoagulation with warfarin.   INR has been therapeutic but dropped slightly to 1.9 yesterday and is down to 1.7 today with 2 mg boosted dose yesterday. EPW out. LDH  stable at 200s-300s and hemoglobin low stable,no overt bleeding noted. Prn PRBCs.  Tube feeds started 1/13 and patient has better appetite per RN today. Will boost dose again today.   Goal of Therapy:  INR 2-2.5 Monitor platelets by anticoagulation protocol: Yes   Plan:  -Warfarin 2 mg PO x1 today -Daily INR, CBC, LDH -May need to further increase warfarin dose with tube  feeds  Cathrine Muster, PharmD PGY2 Cardiology Pharmacy Resident Phone: [phone] 10/04/2021  12:30 PM  Please check [email or web address] for unit-specific pharmacy phone numbers.

## 2021-10-04 NOTE — Progress Notes (Signed)
Patient ID: Isaac [name], male   DOB: 1951/09/21, 71 y.o.   MRN: [id number]    Advanced Heart Failure VAD Team Note  PCP-Cardiologist: None   Subjective:    12/19 RHC- RA 7, PA 39/14 (25), PCWP 11, CO 6.4 CI 3. Thermo 3.5 1.8. Lasix drip stopped after cath. 12/20 Swan removed.  Norepi 3 mcg added.  12/21 Fever. Blood CX drawn. UA + leukocytes. Started on vanc and cefepime. Diuresed with IV lasix + metolazone.  12/23 Impella 5.5 placed 12/28 HM III LVAD, ICD leads plastered to tricuspid valve with severe TR, valve not replaced and some improvement in TR with LVAD placement.  12/29 Extubated 01/03 Started CRRT 01/07  L thoracentesis and placement of [facility] 01/09 LVAD speed increased to 5600 rpm 01/10 Milrinone stopped. Amio gtt>>PO. Completed 5 day course Cefepime.  01/12 Ramp echo done: Moderate lateral pericardial effusion w/o tamponade, RV larger with severe dilation/moderate dysfunction and left shift of the septum, LV small and undershifted.  TR severe.  LVAD speed decreased to 5000 rpms at 2 different sessions.  Also had thoracentesis on left with 1.3 L off.   He remains on milrinone 0.25 and NE 3, MAP 70s-80s.  CVVH ongoing, pulling 100-150 cc/hr with weight down again.  Still with CVP 15.    He appears to be in NSR, now on po amiodarone.   LDH 295>314>313>300 INR 1.7  1 unit PRBCs yesterday, hgb 8.6 today.   Now getting tube feeds but also eating better.   Walked in hall yesterday, mobility improving.   LVAD INTERROGATION:  HeartMate III LVAD:   Flow 4.3 liters/min, speed 5000, power 3.4, PI 3.2.  Still with PI events but fewer.  VAD interrogated personally.      Objective:    Vital Signs:   Temp:  [97.7 F (36.5 C)-98.6 F (37 C)] 97.9 F (36.6 C) (01/14 0800) Pulse Rate:  [29-195] 60 (01/14 0800) Resp:  [11-26] 17 (01/14 0800) BP: (74-135)/(46-121) 135/121 (01/14 0800) SpO2:  [77 %-100 %] 95 % (01/14 0800) Weight:  [72.5 kg] 72.5 kg (01/14 0630) Last BM  Date: 10/03/21 Mean arterial Pressure  70s-80s   Intake/Output:   Intake/Output Summary (Last 24 hours) at 10/04/2021 0816 Last data filed at 10/04/2021 0700 Gross per 24 hour  Intake 1453.52 ml  Output 6090 ml  Net -4636.48 ml     Physical Exam   CVP 15 General: Well appearing this am. NAD.  HEENT: Normal. Neck: Supple, JVP 7-8 cm. Carotids OK.  Cardiac:  Mechanical heart sounds with LVAD hum present.  Lungs:  CTAB, normal effort.  Abdomen:  NT, ND, no HSM. No bruits or masses. +BS  LVAD exit site: Well-healed and incorporated. Dressing dry and intact. No erythema or drainage. Stabilization device present and accurately applied. Driveline dressing changed daily per sterile technique. Extremities:  Warm and dry. No cyanosis, clubbing, rash, or edema.  Neuro:  Alert & oriented x 3. Cranial nerves grossly intact. Moves all 4 extremities w/o difficulty. Affect pleasant     Telemetry   Appears to be NSR 70s (personally reviewed)   Labs   Basic Metabolic Panel: Recent Labs  Lab 10/01/21 0345 10/01/21 1532 10/02/21 0415 10/02/21 1722 10/03/21 0406 10/03/21 1620 10/04/21 0413  NA 136   < > 136 134* 134* 136 137  K 4.1   < > 4.5 4.3 4.4 4.0 4.4  CL 103   < > 101 102 102 102 100  CO2 24   < >  25 25 27 26 28   GLUCOSE 101*   < > 72 171* 193* 149* 117*  BUN 16   < > 15 16 17 17 15   CREATININE 1.52*   < > 1.46* 1.43* 1.48* 1.39* 1.34*  CALCIUM 7.9*   < > 8.2* 7.8* 7.9* 7.6* 8.4*  MG 2.3  --  2.4  --  2.4 2.2 2.5*  PHOS 2.0*   < > 1.9* 1.7* <1.0* 2.3* 1.9*   1.9*   < > = values in this interval not displayed.    Liver Function Tests: Recent Labs  Lab 10/02/21 0415 10/02/21 1722 10/03/21 0406 10/03/21 1620 10/04/21 0413  ALBUMIN 1.9* 1.9* 1.9* 1.9* 2.0*   No results for input(s): LIPASE, AMYLASE in the last 168 hours. No results for input(s): AMMONIA in the last 168 hours.   CBC: Recent Labs  Lab 09/28/21 1951 09/29/21 0311 09/30/21 0941 10/01/21 0927  10/02/21 0415 10/02/21 1400 10/03/21 0406 10/04/21 0413  WBC 14.2*   < > 14.1* 13.2* 13.2*  --  16.2* 14.5*  NEUTROABS 11.5*  --   --   --   --   --   --   --   HGB 7.7*   < > 8.2* 7.8* 7.7* 8.2* 7.2* 8.6*  HCT 23.7*   < > 24.7* 23.8* 24.2* 25.5* 22.4* 27.3*  MCV 86.5   < > 86.7 87.5 90.3  --  89.2 91.0  PLT 475*   < > 519* 541* 576*  --  534* 488*   < > = values in this interval not displayed.    INR: Recent Labs  Lab 09/30/21 0317 10/01/21 0345 10/02/21 0415 10/03/21 0406 10/04/21 0413  INR 2.5* 2.3* 2.2* 1.9* 1.7*    Other results:   Imaging   DG Abd 1 View  Result Date: 10/03/2021 CLINICAL DATA:  Enteric tube placement. EXAM: ABDOMEN - 1 VIEW COMPARISON:  April 18, 2020. FINDINGS: The bowel gas pattern is normal. Distal tip of feeding tube is seen in proximal stomach. No radio-opaque calculi or other significant radiographic abnormality are seen. IMPRESSION: Distal tip of feeding tube seen in proximal stomach. Electronically Signed   By: Marijo Conception M.D.   On: 10/03/2021 11:39   DG CHEST PORT 1 VIEW  Result Date: 10/03/2021 CLINICAL DATA:  Shortness of breath. EXAM: PORTABLE CHEST 1 VIEW COMPARISON:  October 02, 2021. FINDINGS: Stable cardiomegaly. Left ventricular assist device is unchanged. Left-sided defibrillator is unchanged. Right internal jugular catheter is unchanged. Feeding tube is seen entering stomach. Bibasilar atelectasis or edema is noted with associated pleural effusions. Bony thorax is unremarkable. IMPRESSION: Interval placement of feeding tube seen entering stomach. Otherwise stable support apparatus. Stable bibasilar opacities are noted. Electronically Signed   By: Marijo Conception M.D.   On: 10/03/2021 11:39   DG CHEST PORT 1 VIEW  Result Date: 10/02/2021 CLINICAL DATA:  Status post thoracentesis. EXAM: PORTABLE CHEST 1 VIEW COMPARISON:  Chest x-ray from same day. FINDINGS: Unchanged left chest wall pacemaker and tunneled right internal jugular  dialysis catheter. Unchanged right upper extremity PICC line. Stable cardiomegaly with LVAD in place. No significant residual left pleural effusion status post thoracentesis. No pneumothorax. Unchanged hazy density at the right lung base likely reflecting layering small pleural effusion and atelectasis. No acute osseous abnormality. IMPRESSION: 1. No significant residual left pleural effusion status post thoracentesis. No pneumothorax. Electronically Signed   By: Titus Dubin M.D.   On: 10/02/2021 17:13   DG CHEST PORT  1 VIEW  Result Date: 10/02/2021 CLINICAL DATA:  Shortness of breath EXAM: PORTABLE CHEST 1 VIEW COMPARISON:  09/27/2021, 09/25/2021 FINDINGS: Post sternotomy changes. Left-sided pacing device as before. Partially visualized LVAD. Right-sided central venous catheter with tip over the right atrium. Cardiomegaly with vascular congestion and worsening pulmonary edema. At least moderate left pleural effusion with suspected right pleural effusion. Basilar airspace disease. IMPRESSION: 1. Cardiomegaly with vascular congestion and worsening pulmonary edema. Increased left greater than right pleural effusions, effusion on the left is at least moderate in size. 2. Basilar consolidations either due to atelectasis or pneumonia Electronically Signed   By: Donavan Foil M.D.   On: 10/02/2021 15:51   DG Abd Portable 1V  Result Date: 10/03/2021 CLINICAL DATA:  Feeding tube placement EXAM: PORTABLE ABDOMEN - 1 VIEW COMPARISON:  10/03/2021 FINDINGS: Feeding tube with the tip projecting along the mid abdomen which may be coiled within a distended stomach versus duodenum. No bowel dilatation to suggest obstruction. No evidence of pneumoperitoneum, portal venous gas or pneumatosis. No pathologic calcifications along the expected course of the ureters. No acute osseous abnormality. IMPRESSION: Feeding tube with the tip projecting along the mid abdomen which may be coiled within a distended stomach versus  duodenum. Electronically Signed   By: Kathreen Devoid M.D.   On: 10/03/2021 12:19   ECHOCARDIOGRAM LIMITED  Result Date: 10/02/2021    ECHOCARDIOGRAM LIMITED REPORT   Patient Name:   KJ IMBERT Date of Exam: 10/02/2021 Medical Rec #:  [id number]           Height:       69.0 in Accession #:    [id number]          Weight:       177.2 lb Date of Birth:  [birth year]            BSA:          1.963 m Patient Age:    17 years            BP:           75/54 mmHg Patient Gender: M                   HR:           64 bpm. Exam Location:  Inpatient Procedure: Limited Echo, Limited Color Doppler and Cardiac Doppler Indications:    CHF-Acute Systolic Q76.22  History:        Patient has prior history of Echocardiogram examinations, most                 recent 10/02/2021. CHF, Previous Myocardial Infarction and CAD;                 Risk Factors:Hypertension and Dyslipidemia.  Sonographer:    Bernadene Person RDCS Referring Phys: [place]  1. Left ventricular ejection fraction, by estimation, is 25 to 30%. The left ventricle has severely decreased function. The left ventricle demonstrates global hypokinesis. There is mild left ventricular hypertrophy. The interventricular septum remains shifted to the left but less than on earlier echo today. The LV cavity is also larger. LVAD inflow cannula at apex. The LVAD speed was decreased to 5000 rpm.  2. Peak RV-RA gradient 34 mmHg. Right ventricular systolic function is moderately reduced. The right ventricular size is severely enlarged. There is mildly elevated pulmonary artery systolic pressure.  3. Left atrial size was moderately dilated.  4. Right atrial size was moderately dilated.  5. The tricuspid valve is abnormal. Tricuspid valve regurgitation is severe.  6. The aortic valve does not open. The aortic valve is tricuspid. Aortic valve regurgitation is trivial.  7. The mitral valve is normal in structure. No evidence of mitral valve regurgitation. No  evidence of mitral stenosis.  8. There is a left pleural effusion. There is a moderate pericardial effusion primarily along the lateral LV wall. This is significantly larger than on previous echo. There is no tamponade. FINDINGS  Left Ventricle: Left ventricular ejection fraction, by estimation, is 25 to 30%. The left ventricle has severely decreased function. The left ventricle demonstrates global hypokinesis. The left ventricular internal cavity size was normal in size. There is mild left ventricular hypertrophy. Right Ventricle: Peak RV-RA gradient 34 mmHg. The right ventricular size is severely enlarged. No increase in right ventricular wall thickness. Right ventricular systolic function is moderately reduced. There is mildly elevated pulmonary artery systolic pressure. The tricuspid regurgitant velocity is 2.90 m/s, and with an assumed right atrial pressure of 8 mmHg, the estimated right ventricular systolic pressure is 88.1 mmHg. Left Atrium: Left atrial size was moderately dilated. Right Atrium: Right atrial size was moderately dilated. Pericardium: There is a left pleural effusion. There is a moderate pericardial effusion primarily along the lateral LV wall. This is significantly larger than on previous echo. There is no tamponade. Mitral Valve: The mitral valve is normal in structure. No evidence of mitral valve stenosis. Tricuspid Valve: The tricuspid valve is abnormal. Tricuspid valve regurgitation is severe. Aortic Valve: The aortic valve does not open. The aortic valve is tricuspid. Aortic valve regurgitation is trivial. Pulmonic Valve: The pulmonic valve was normal in structure. Pulmonic valve regurgitation is trivial. Additional Comments: A device lead is visualized in the right ventricle. LEFT VENTRICLE PLAX 2D LVIDd:         3.40 cm  RIGHT VENTRICLE RV S prime:     7.56 cm/s PULMONIC VALVE RVOT Peak grad: 1 mmHg  TRICUSPID VALVE TR Peak grad:   33.6 mmHg TR Vmax:        290.00 cm/s  SHUNTS Pulmonic  VTI: 0.069 m Sarahann Horrell McleanMD Electronically signed by Franki Monte Signature Date/Time: 10/02/2021/4:08:36 PM    Final    ECHOCARDIOGRAM LIMITED  Result Date: 10/02/2021    ECHOCARDIOGRAM LIMITED REPORT   Patient Name:   KAMIL MCHAFFIE Date of Exam: 10/02/2021 Medical Rec #:  [id number]           Height:       69.0 in Accession #:    [id number]          Weight:       177.2 lb Date of Birth:  1950-11-17            BSA:          1.963 m Patient Age:    46 years            BP:           84/74 mmHg Patient Gender: M                   HR:           62 bpm. Exam Location:  Inpatient Procedure: Limited Echo, Cardiac Doppler and Color Doppler Indications:    CHF-Acute Systolic M38.17  History:        Patient has prior history of Echocardiogram examinations, most  recent 09/23/2021. CHF, Previous Myocardial Infarction and CAD;                 Risk Factors:Hypertension and Dyslipidemia.  Sonographer:    Bernadene Person RDCS Referring Phys: [place]  1. Left ventricular ejection fraction, by estimation, is 25 to 30%. The left ventricle has severely decreased function. The left ventricle demonstrates global hypokinesis. There is mild left ventricular hypertrophy. The LV is small and underfilled, the interventricular septum is shifted left. The LVAD speed was serially decreased from 5600 rpm to 5100 rpm. LVAD inflow cannula at apex.  2. Peak RV-RA gradient 28 mmHg. Right ventricular systolic function is moderately reduced. The right ventricular size is severely enlarged. There is mildly elevated pulmonary artery systolic pressure.  3. The mitral valve is normal in structure. No evidence of mitral valve regurgitation. No evidence of mitral stenosis.  4. The tricuspid valve is abnormal. Tricuspid valve regurgitation is severe.  5. The aortic valve does not open at any of the evaluated LVAD speeds. The aortic valve is tricuspid. Aortic valve regurgitation is trivial.  6. Left atrial size  was moderately dilated.  7. Right atrial size was moderately dilated.  8. Moderate pericardial effusion primarily adjacent to the lateral wall of the left ventricle. No tamponade. Effusion is significantly larger than on the prior echo. There is a left pleural effusion. FINDINGS  Left Ventricle: Left ventricular ejection fraction, by estimation, is 25 to 30%. The left ventricle has severely decreased function. The left ventricle demonstrates global hypokinesis. The left ventricular internal cavity size was small. There is mild left ventricular hypertrophy. Right Ventricle: Peak RV-RA gradient 28 mmHg. The right ventricular size is severely enlarged. Right ventricular systolic function is moderately reduced. There is mildly elevated pulmonary artery systolic pressure. The tricuspid regurgitant velocity is 2.66 m/s, and with an assumed right atrial pressure of 8 mmHg, the estimated right ventricular systolic pressure is 51.7 mmHg. Left Atrium: Left atrial size was moderately dilated. Right Atrium: Right atrial size was moderately dilated. Pericardium: Moderate pericardial effusion primarily adjacent to the lateral wall of the left ventricle. No tamponade. Effusion is significantly larger than on the prior echo. There is a left pleural effusion. Mitral Valve: The mitral valve is normal in structure. No evidence of mitral valve stenosis. Tricuspid Valve: The tricuspid valve is abnormal. Tricuspid valve regurgitation is severe. Aortic Valve: The aortic valve does not open at any of the evaluated LVAD speeds. The aortic valve is tricuspid. Aortic valve regurgitation is trivial. Pulmonic Valve: The pulmonic valve was normal in structure. Pulmonic valve regurgitation is trivial. Additional Comments: A device lead is visualized in the right ventricle. RIGHT VENTRICLE RV S prime:     7.02 cm/s PULMONIC VALVE RVOT Peak grad: 1 mmHg  TRICUSPID VALVE TR Peak grad:   28.3 mmHg TR Vmax:        266.00 cm/s  SHUNTS Pulmonic VTI:  0.077 m Kameria Canizares McleanMD Electronically signed by Franki Monte Signature Date/Time: 10/02/2021/4:04:34 PM    Final      Medications:     Scheduled Medications:  sodium chloride   Intravenous Once   sodium chloride   Intravenous Once   sodium chloride   Intravenous Once   sodium chloride   Intravenous Once   sodium chloride   Intravenous Once   acetaminophen  500 mg Oral Q6H   amiodarone  200 mg Oral BID   aspirin  81 mg Oral Daily   Chlorhexidine Gluconate Cloth  6 each Topical Daily   docusate  200 mg Per Tube Daily   feeding supplement  237 mL Oral TID BM   mouth rinse  15 mL Mouth Rinse BID   metoCLOPramide (REGLAN) injection  5 mg Intravenous Q8H   midodrine  15 mg Oral TID WC   montelukast  5 mg Oral QHS   multivitamin  1 tablet Oral QHS   pantoprazole  40 mg Oral Daily   polyethylene glycol  17 g Oral Daily   rosuvastatin  10 mg Oral Daily   sodium chloride flush  10-40 mL Intracatheter Q12H   sodium chloride flush  3 mL Intravenous Q12H   Warfarin - Pharmacist Dosing Inpatient   Does not apply q1600    Infusions:   prismasol BGK 4/2.5 400 mL/hr at 10/04/21 0322    prismasol BGK 4/2.5 200 mL/hr at 10/03/21 2211   sodium chloride Stopped (09/20/21 0304)   sodium chloride Stopped (09/18/21 0803)   sodium chloride Stopped (10/02/21 0900)   epinephrine Stopped (10/01/21 1017)   feeding supplement (PIVOT 1.5 CAL) 20 mL/hr at 10/04/21 0700   lactated ringers     milrinone 0.25 mcg/kg/min (10/04/21 0700)   norepinephrine (LEVOPHED) Adult infusion 3 mcg/min (10/04/21 0700)   prismasol BGK 4/2.5 1,800 mL/hr at 10/04/21 0309   promethazine (PHENERGAN) injection (IM or IVPB) Stopped (10/01/21 1714)    PRN Medications: sodium chloride, sodium chloride, albuterol, heparin, heparin, lip balm, morphine injection, ondansetron (ZOFRAN) IV, oxyCODONE, polyethylene glycol, promethazine (PHENERGAN) injection (IM or IVPB), sodium chloride flush, sodium chloride flush,  traMADol   Assessment/Plan:    1. Acute on chronic systolic CHF:  Long-standing cardiomyopathy.  [place].  Echo this admission with EF < 20%, severe LV dilation, restrictive diastolic function, moderate RV dysfunction, moderate MR, mod-severe TR. Cause of cardiomyopathy is uncertain.  He has a history of CAD, but I do not think that the described CAD from the past could explain his cardiomyopathy, but CAD could have progressed.  With difficulty tolerating GDMT/need for midodrine and cardiorenal syndrome as well as profound volume overload + NYHA class IV symptoms,  concerned for low output HF. Co-ox off milrinone was 36%, milrinone started and increased to 0.375 but CO remained low. NE added and Impella 5.5 placed 12/23. EF 10% on TEE 12/23. s/p HM III VAD on 12/28.  Ramp echo 01/03 with EF 20-25%, RV moderately reduced, septum mid-line.  Ramp echo again on 1/12 showed severely dilated/moderately dysfunctional RV with severe TR and left-shifted septum with small LV.  Speed decreased on 1/12 to 5000 rpm over 2 sessions due to RV failure and milrinone 0.25 + NE 3 restarted.  Able to increase CVVH, weight down significantly.  MAP 70s-80s today, CVP 15 with co-ox 54%.  Walked around unit yesterday.  Getting fluid off should help his RV. Stable LDH.  - Continue milrinone 0.25 mcg/kg/min, may need to go home on this for RV support.  - Wean down NE as able.  - Continue midodrine 15 mg tid.  - Continue CVVH, aim for about 100 cc/hr net negative UF.    - He does not have BP room for sildenafil.  - Warfarin for INR 2-2.5.  INR 1.7 today, adjust warfarin.   - Continue 81 mg aspirin - Continue UNNA boots 2. Tricuspid regurgitation:  Tricuspid repair not done at time of VAD d/t proximity of ICD wires and hypotension during surgery.  He has severe TR.  3. Atrial fibrillation: Persistent since 10/22 based  on device interrogation.  Was in junctional rhythm, now probably NSR. - Continue PO amio 200 bid   4. AKI on CKD stage 3: Gradual rise (was about 1.7 pre-op) since surgery.  He has not been hypotensive post-op, suspect intra-op hypotension led to development of ATN => urine sediment looked like ATN per renal. Now on CVVH. TDC placed 1/7.  - Maintain MAP and CO as above - Pulling for net 100 cc/hr UF.  - Nephrology following.   5. CAD: History of PCI to OM1 in 2007 and RCA in 2013.  No CP or ACs.  - Deferred cardiac cath due to AKI and plan for VAD - Continue Crestor.  6. ID: Had fever 12/21, PCT 1.03.  Blood cultures negative. ?Phlebitis at IV site. Post-op fever with elevated WBCs, PCT 2.63 -> 1.78.  He completed course of cefepime. Afebrile.  7. Constipation: Continue aggressive bowel regimen. improved 8. Hyponatremia: Hypervolemic hyponatremia.  Controlled via CVVH.  9. Anemia: 1 unit PRBCs on 1/12,  1 unit PRBs 1/13. No overt bleeding, ?losses via CVVH.  Hgb 8.6 today.  - Transfuse < 7.5.  10. Confusion/delirium: Initially post-op and started on Precedex.  This has cleared, now up and has been walking halls 11. F/E/N: TFs via [place] for poor nutritional status.  Improving po intake.  - Continue TFs today.  12. Left pleural effusion: - s/p thoracentesis 1/7 - s/p thoracentesis 1/12.  Walk in hall today.   I reviewed the LVAD parameters from today, and compared the results to the patient's prior recorded data.  No programming changes were made.  The LVAD is functioning within specified parameters.  The patient performs LVAD self-test daily.  LVAD interrogation was negative for any significant power changes, alarms or PI events/speed drops.  LVAD equipment check completed and is in good working order.  Back-up equipment present.   LVAD education done on emergency procedures and precautions and reviewed exit site care.  CRITICAL CARE Performed by: Loralie Champagne  Total critical care time: 40 minutes  Critical care time was exclusive of separately billable procedures and treating  other patients.  Critical care was necessary to treat or prevent imminent or life-threatening deterioration.  Critical care was time spent personally by me on the following activities: development of treatment plan with patient and/or surrogate as well as nursing, discussions with consultants, evaluation of patient's response to treatment, examination of patient, obtaining history from patient or surrogate, ordering and performing treatments and interventions, ordering and review of laboratory studies, ordering and review of radiographic studies, pulse oximetry and re-evaluation of patient's condition.  Loralie Champagne 10/04/2021 8:16 AM

## 2021-10-05 DIAGNOSIS — Z95811 Presence of heart assist device: Secondary | ICD-10-CM | POA: Diagnosis not present

## 2021-10-05 DIAGNOSIS — R570 Cardiogenic shock: Secondary | ICD-10-CM | POA: Diagnosis not present

## 2021-10-05 LAB — RENAL FUNCTION PANEL
Albumin: 2 g/dL — ABNORMAL LOW (ref 3.5–5.0)
Albumin: 2 g/dL — ABNORMAL LOW (ref 3.5–5.0)
Anion gap: 8 (ref 5–15)
Anion gap: 8 (ref 5–15)
BUN: 19 mg/dL (ref 8–23)
BUN: 21 mg/dL (ref 8–23)
CO2: 27 mmol/L (ref 22–32)
CO2: 27 mmol/L (ref 22–32)
Calcium: 8.2 mg/dL — ABNORMAL LOW (ref 8.9–10.3)
Calcium: 8 mg/dL — ABNORMAL LOW (ref 8.9–10.3)
Chloride: 100 mmol/L (ref 98–111)
Chloride: 98 mmol/L (ref 98–111)
Creatinine, Ser: 1.38 mg/dL — ABNORMAL HIGH (ref 0.61–1.24)
Creatinine, Ser: 1.42 mg/dL — ABNORMAL HIGH (ref 0.61–1.24)
GFR, Estimated: 55 mL/min — ABNORMAL LOW (ref >60)
GFR, Estimated: 53 mL/min — ABNORMAL LOW (ref >60)
Glucose, Bld: 109 mg/dL — ABNORMAL HIGH (ref 70–99)
Glucose, Bld: 145 mg/dL — ABNORMAL HIGH (ref 70–99)
Phosphorus: 1.7 mg/dL — ABNORMAL LOW (ref 2.5–4.6)
Phosphorus: 1.3 mg/dL — ABNORMAL LOW (ref 2.5–4.6)
Potassium: 5 mmol/L (ref 3.5–5.1)
Potassium: 5.2 mmol/L — ABNORMAL HIGH (ref 3.5–5.1)
Sodium: 135 mmol/L (ref 135–145)
Sodium: 133 mmol/L — ABNORMAL LOW (ref 135–145)

## 2021-10-05 LAB — CBC
HCT: 26.5 % — ABNORMAL LOW (ref 39.0–52.0)
Hemoglobin: 8.3 g/dL — ABNORMAL LOW (ref 13.0–17.0)
MCH: 29.3 pg (ref 26.0–34.0)
MCHC: 31.3 g/dL (ref 30.0–36.0)
MCV: 93.6 fL (ref 80.0–100.0)
Platelets: 434 10*3/uL — ABNORMAL HIGH (ref 150–400)
RBC: 2.83 MIL/uL — ABNORMAL LOW (ref 4.22–5.81)
RDW: 21.3 % — ABNORMAL HIGH (ref 11.5–15.5)
WBC: 13 10*3/uL — ABNORMAL HIGH (ref 4.0–10.5)
nRBC: 0.4 % — ABNORMAL HIGH (ref 0.0–0.2)

## 2021-10-05 LAB — GLUCOSE, CAPILLARY
Glucose-Capillary: 153 mg/dL — ABNORMAL HIGH (ref 70–99)
Glucose-Capillary: 118 mg/dL — ABNORMAL HIGH (ref 70–99)
Glucose-Capillary: 111 mg/dL — ABNORMAL HIGH (ref 70–99)
Glucose-Capillary: 112 mg/dL — ABNORMAL HIGH (ref 70–99)
Glucose-Capillary: 110 mg/dL — ABNORMAL HIGH (ref 70–99)

## 2021-10-05 LAB — PHOSPHORUS: Phosphorus: 1.7 mg/dL — ABNORMAL LOW (ref 2.5–4.6)

## 2021-10-05 LAB — PROTIME-INR
INR: 1.6 — ABNORMAL HIGH (ref 0.8–1.2)
Prothrombin Time: 18.6 seconds — ABNORMAL HIGH (ref 11.4–15.2)

## 2021-10-05 LAB — COOXEMETRY PANEL
Carboxyhemoglobin: 1.7 % — ABNORMAL HIGH (ref 0.5–1.5)
Methemoglobin: 1 % (ref 0.0–1.5)
O2 Saturation: 52.5 %
Total hemoglobin: 8.6 g/dL — ABNORMAL LOW (ref 12.0–16.0)

## 2021-10-05 LAB — LACTATE DEHYDROGENASE: LDH: 285 U/L — ABNORMAL HIGH (ref 98–192)

## 2021-10-05 LAB — MAGNESIUM: Magnesium: 2.3 mg/dL (ref 1.7–2.4)

## 2021-10-05 MED ORDER — WARFARIN SODIUM 2 MG PO TABS
2.0000 mg | ORAL_TABLET | Freq: Once | ORAL | Status: AC
  Administered 2021-10-05: 2 mg via ORAL
  Filled 2021-10-05: qty 1

## 2021-10-05 MED ORDER — DARBEPOETIN ALFA 60 MCG/0.3ML IJ SOSY
60.0000 ug | PREFILLED_SYRINGE | INTRAMUSCULAR | Status: DC
  Administered 2021-10-06: 60 ug via SUBCUTANEOUS
  Filled 2021-10-05: qty 0.3

## 2021-10-05 NOTE — Progress Notes (Signed)
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 Acute component either from cardiorenal +/- ATN with decreased renal function from hemodynamics and CPB + hypotensive episodes + pressors.  - CRRT started on 1/3.       - back on NE and milrinone, weaning NE as tolerated     - UF as tolerated today- continue net negative 100-200 mL/ hr    - all 4K, no heparin intracircuit          -Cont monitoring daily I/Os + weights  -Maintain MAP>65 for optimal renal perfusion.  -Avoid nephrotoxic medications including NSAIDs, contrast. - Dose medications for a GFR <15 ml/min and also on CRRT.   Cardiogenic shock: s/p LVAD 09/17/22 (HM 3).   - TTE to assess for RV failure 10/02/21--> RV function down - VAD speed decreased 10/02/21 - on NE and milrinone again - symptomatic improvement.  Co-ox overall improved  CAD with h/o PCI last in 2013  Anemia- will do iron panel for AM labs and give Aranesp 10/06/21  Afib- on amio  Acute hypoxic RF: s/p thoracentesis 1.3 L 10/02/21  Dispo: ICU  Subjective:    Continues to walk every morning.  Eating more.  Weights continue to come down- nearly 20 kg down from 09/27/21.   Objective:   BP (!) 81/59    Pulse 70    Temp 97.6 F (36.4 C) (Oral)    Resp 16    Ht 5\' 9"  (1.753 m)    Wt 72.7 kg    SpO2 (!) 85%    BMI 23.67 kg/m   Intake/Output Summary (Last 24 hours) at 10/05/2021 1021 Last data filed at 10/05/2021 0700 Gross per 24 hour  Intake 1339.41 ml  Output 2841 ml  Net -1501.59 ml   Weight change: 0.2 kg  Physical Exam: GEN: NAD, A&Ox3 sitting on edge of bed HEENT: wearing glasses NECK: + JVD to angle of mandible LUNGS: faint basilar crackles on L, none on R CV: LVAD hum ABD: + bowel sounds EXT: 1+ lower extremity edema and also present in the UE, much improved, legs wrapped ACCESS: rt IJ TC   Imaging: DG Abd 1 View  Result Date: 10/03/2021 CLINICAL  DATA:  Enteric tube placement. EXAM: ABDOMEN - 1 VIEW COMPARISON:  April 18, 2020. FINDINGS: The bowel gas pattern is normal. Distal tip of feeding tube is seen in proximal stomach. No radio-opaque calculi or other significant radiographic abnormality are seen. IMPRESSION: Distal tip of feeding tube seen in proximal stomach. Electronically Signed   By: Marijo Conception M.D.   On: 10/03/2021 11:39   DG CHEST PORT 1 VIEW  Result Date: 10/04/2021 CLINICAL DATA:  Shortness of breath EXAM: PORTABLE CHEST 1 VIEW COMPARISON:  Previous studies including the examination of 10/03/2021 FINDINGS: Transverse diameter of heart is increased. Left ventricular assist device is noted in place. Pacemaker/defibrillator battery is seen in the left infraclavicular region. Enteric tube is noted with its distal portion in the stomach. There is large caliber central venous catheter with its tip at the junction of superior vena cava and right atrium. Distal course of right PICC line is seen in the region of superior vena cava. There is interval decrease in pulmonary vascular congestion. There is interval improvement in aeration of mid and lower lung fields suggesting resolving pulmonary edema. There is increased density in the left lower lung fields which may be  due to pleural effusion and possibly underlying atelectasis/pneumonia. There is no pneumothorax. IMPRESSION: Cardiomegaly. There is interval decrease in pulmonary vascular congestion and resolution of pulmonary edema. Increased density in the left lower lung fields may be due to pleural effusion and possibly underlying infiltrate. Electronically Signed   By: Elmer Picker M.D.   On: 10/04/2021 08:53   DG CHEST PORT 1 VIEW  Result Date: 10/03/2021 CLINICAL DATA:  Shortness of breath. EXAM: PORTABLE CHEST 1 VIEW COMPARISON:  October 02, 2021. FINDINGS: Stable cardiomegaly. Left ventricular assist device is unchanged. Left-sided defibrillator is unchanged. Right internal  jugular catheter is unchanged. Feeding tube is seen entering stomach. Bibasilar atelectasis or edema is noted with associated pleural effusions. Bony thorax is unremarkable. IMPRESSION: Interval placement of feeding tube seen entering stomach. Otherwise stable support apparatus. Stable bibasilar opacities are noted. Electronically Signed   By: Marijo Conception M.D.   On: 10/03/2021 11:39   DG Abd Portable 1V  Result Date: 10/03/2021 CLINICAL DATA:  Feeding tube placement EXAM: PORTABLE ABDOMEN - 1 VIEW COMPARISON:  10/03/2021 FINDINGS: Feeding tube with the tip projecting along the mid abdomen which may be coiled within a distended stomach versus duodenum. No bowel dilatation to suggest obstruction. No evidence of pneumoperitoneum, portal venous gas or pneumatosis. No pathologic calcifications along the expected course of the ureters. No acute osseous abnormality. IMPRESSION: Feeding tube with the tip projecting along the mid abdomen which may be coiled within a distended stomach versus duodenum. Electronically Signed   By: Kathreen Devoid M.D.   On: 10/03/2021 12:19    Labs: BMET Recent Labs  Lab 10/02/21 0415 10/02/21 1722 10/03/21 0406 10/03/21 1620 10/04/21 0413 10/04/21 1558 10/05/21 0408  NA 136 134* 134* 136 137 133* 135  K 4.5 4.3 4.4 4.0 4.4 4.9 5.0  CL 101 102 102 102 100 102 100  CO2 25 25 27 26 28 27 27   GLUCOSE 72 171* 193* 149* 117* 141* 109*  BUN 15 16 17 17 15 18 19   CREATININE 1.46* 1.43* 1.48* 1.39* 1.34* 1.42* 1.38*  CALCIUM 8.2* 7.8* 7.9* 7.6* 8.4* 7.8* 8.2*  PHOS 1.9* 1.7* <1.0* 2.3* 1.9*   1.9* 1.7*   1.7* 1.7*   1.7*   CBC Recent Labs  Lab 09/28/21 1951 09/29/21 0311 10/02/21 0415 10/02/21 1400 10/03/21 0406 10/04/21 0413 10/05/21 0408  WBC 14.2*   < > 13.2*  --  16.2* 14.5* 13.0*  NEUTROABS 11.5*  --   --   --   --   --   --   HGB 7.7*   < > 7.7* 8.2* 7.2* 8.6* 8.3*  HCT 23.7*   < > 24.2* 25.5* 22.4* 27.3* 26.5*  MCV 86.5   < > 90.3  --  89.2 91.0 93.6   PLT 475*   < > 576*  --  534* 488* 434*   < > = values in this interval not displayed.    Medications:     sodium chloride   Intravenous Once   sodium chloride   Intravenous Once   sodium chloride   Intravenous Once   sodium chloride   Intravenous Once   sodium chloride   Intravenous Once   acetaminophen  500 mg Oral Q6H   amiodarone  200 mg Oral BID   aspirin  81 mg Oral Daily   Chlorhexidine Gluconate Cloth  6 each Topical Daily   docusate  200 mg Per Tube Daily   feeding supplement  237 mL Oral TID  BM   mouth rinse  15 mL Mouth Rinse BID   metoCLOPramide (REGLAN) injection  5 mg Intravenous Q8H   midodrine  15 mg Oral TID WC   montelukast  5 mg Oral QHS   multivitamin  1 tablet Oral QHS   pantoprazole  40 mg Oral Daily   polyethylene glycol  17 g Oral Daily   rosuvastatin  10 mg Oral Daily   sodium chloride flush  10-40 mL Intracatheter Q12H   sodium chloride flush  3 mL Intravenous Q12H   Warfarin - Pharmacist Dosing Inpatient   Does not apply q1600      Madelon Lips, MD 10/05/2021, 10:21 AM

## 2021-10-05 NOTE — Progress Notes (Signed)
Patient ID: Isaac [name], male   DOB: Apr 16, 1951, 71 y.o.   MRN: [id number]    Advanced Heart Failure VAD Team Note  PCP-Cardiologist: None   Subjective:    12/19 RHC- RA 7, PA 39/14 (25), PCWP 11, CO 6.4 CI 3. Thermo 3.5 1.8. Lasix drip stopped after cath. 12/20 Swan removed.  Norepi 3 mcg added.  12/21 Fever. Blood CX drawn. UA + leukocytes. Started on vanc and cefepime. Diuresed with IV lasix + metolazone.  12/23 Impella 5.5 placed 12/28 HM III LVAD, ICD leads plastered to tricuspid valve with severe TR, valve not replaced and some improvement in TR with LVAD placement.  12/29 Extubated 01/03 Started CRRT 01/07  L thoracentesis and placement of [facility] 01/09 LVAD speed increased to 5600 rpm 01/10 Milrinone stopped. Amio gtt>>PO. Completed 5 day course Cefepime.  01/12 Ramp echo done: Moderate lateral pericardial effusion w/o tamponade, RV larger with severe dilation/moderate dysfunction and left shift of the septum, LV small and undershifted.  TR severe.  LVAD speed decreased to 5000 rpms at 2 different sessions.  Also had thoracentesis on left with 1.3 L off.  01/13 TFs begun  He remains on milrinone 0.25 and NE 3, MAP 70s-80s.  CVVH ongoing, pulling 100 cc/hr.  Still with CVP 13.    He appears to be in NSR, now on po amiodarone.   LDH 295>314>313>300>285 INR 1.6  Hgb 8.6 => 8.3  Now getting tube feeds but also eating better.   Walked in hall yesterday, mobility improving.   LVAD INTERROGATION:  HeartMate III LVAD:   Flow 4.3 liters/min, speed 5000, power 3.3, PI 2.6.  No PI events.  VAD interrogated personally.      Objective:    Vital Signs:   Temp:  [97.6 F (36.4 C)-99.2 F (37.3 C)] 97.6 F (36.4 C) (01/15 0734) Pulse Rate:  [60-80] 70 (01/15 0700) Resp:  [12-26] 16 (01/15 0700) BP: (70-105)/(56-83) 81/59 (01/15 0700) SpO2:  [85 %-100 %] 85 % (01/15 0700) Weight:  [72.7 kg] 72.7 kg (01/15 0700) Last BM Date: 10/04/21 Mean arterial Pressure  70s-80s    Intake/Output:   Intake/Output Summary (Last 24 hours) at 10/05/2021 0819 Last data filed at 10/05/2021 0700 Gross per 24 hour  Intake 1429.73 ml  Output 2841 ml  Net -1411.27 ml     Physical Exam   CVP 13 General: Well appearing this am. NAD.  HEENT: Normal. Neck: Supple, JVP 12 cm. Carotids OK.  Cardiac:  Mechanical heart sounds with LVAD hum present.  Lungs:  CTAB, normal effort.  Abdomen:  NT, ND, no HSM. No bruits or masses. +BS  LVAD exit site: Well-healed and incorporated. Dressing dry and intact. No erythema or drainage. Stabilization device present and accurately applied. Driveline dressing changed daily per sterile technique. Extremities:  Warm and dry. No cyanosis, clubbing, rash.  Trace ankle edema.  Neuro:  Alert & oriented x 3. Cranial nerves grossly intact. Moves all 4 extremities w/o difficulty. Affect pleasant     Telemetry   Appears to be NSR 70s with occasional pacing (personally reviewed)   Labs   Basic Metabolic Panel: Recent Labs  Lab 10/03/21 0406 10/03/21 1620 10/04/21 0413 10/04/21 1558 10/05/21 0408  NA 134* 136 137 133* 135  K 4.4 4.0 4.4 4.9 5.0  CL 102 102 100 102 100  CO2 27 26 28 27 27   GLUCOSE 193* 149* 117* 141* 109*  BUN 17 17 15 18 19   CREATININE 1.48* 1.39* 1.34* 1.42* 1.38*  CALCIUM 7.9* 7.6* 8.4* 7.8* 8.2*  MG 2.4 2.2 2.5* 2.4 2.3  PHOS <1.0* 2.3* 1.9*   1.9* 1.7*   1.7* 1.7*   1.7*    Liver Function Tests: Recent Labs  Lab 10/03/21 0406 10/03/21 1620 10/04/21 0413 10/04/21 1558 10/05/21 0408  ALBUMIN 1.9* 1.9* 2.0* 2.0* 2.0*   No results for input(s): LIPASE, AMYLASE in the last 168 hours. No results for input(s): AMMONIA in the last 168 hours.   CBC: Recent Labs  Lab 09/28/21 1951 09/29/21 0311 10/01/21 0927 10/02/21 0415 10/02/21 1400 10/03/21 0406 10/04/21 0413 10/05/21 0408  WBC 14.2*   < > 13.2* 13.2*  --  16.2* 14.5* 13.0*  NEUTROABS 11.5*  --   --   --   --   --   --   --   HGB 7.7*   < >  7.8* 7.7* 8.2* 7.2* 8.6* 8.3*  HCT 23.7*   < > 23.8* 24.2* 25.5* 22.4* 27.3* 26.5*  MCV 86.5   < > 87.5 90.3  --  89.2 91.0 93.6  PLT 475*   < > 541* 576*  --  534* 488* 434*   < > = values in this interval not displayed.    INR: Recent Labs  Lab 10/01/21 0345 10/02/21 0415 10/03/21 0406 10/04/21 0413 10/05/21 0408  INR 2.3* 2.2* 1.9* 1.7* 1.6*    Other results:   Imaging   DG Abd 1 View  Result Date: 10/03/2021 CLINICAL DATA:  Enteric tube placement. EXAM: ABDOMEN - 1 VIEW COMPARISON:  April 18, 2020. FINDINGS: The bowel gas pattern is normal. Distal tip of feeding tube is seen in proximal stomach. No radio-opaque calculi or other significant radiographic abnormality are seen. IMPRESSION: Distal tip of feeding tube seen in proximal stomach. Electronically Signed   By: Marijo Conception M.D.   On: 10/03/2021 11:39   DG CHEST PORT 1 VIEW  Result Date: 10/04/2021 CLINICAL DATA:  Shortness of breath EXAM: PORTABLE CHEST 1 VIEW COMPARISON:  Previous studies including the examination of 10/03/2021 FINDINGS: Transverse diameter of heart is increased. Left ventricular assist device is noted in place. Pacemaker/defibrillator battery is seen in the left infraclavicular region. Enteric tube is noted with its distal portion in the stomach. There is large caliber central venous catheter with its tip at the junction of superior vena cava and right atrium. Distal course of right PICC line is seen in the region of superior vena cava. There is interval decrease in pulmonary vascular congestion. There is interval improvement in aeration of mid and lower lung fields suggesting resolving pulmonary edema. There is increased density in the left lower lung fields which may be due to pleural effusion and possibly underlying atelectasis/pneumonia. There is no pneumothorax. IMPRESSION: Cardiomegaly. There is interval decrease in pulmonary vascular congestion and resolution of pulmonary edema. Increased density in  the left lower lung fields may be due to pleural effusion and possibly underlying infiltrate. Electronically Signed   By: Elmer Picker M.D.   On: 10/04/2021 08:53   DG CHEST PORT 1 VIEW  Result Date: 10/03/2021 CLINICAL DATA:  Shortness of breath. EXAM: PORTABLE CHEST 1 VIEW COMPARISON:  October 02, 2021. FINDINGS: Stable cardiomegaly. Left ventricular assist device is unchanged. Left-sided defibrillator is unchanged. Right internal jugular catheter is unchanged. Feeding tube is seen entering stomach. Bibasilar atelectasis or edema is noted with associated pleural effusions. Bony thorax is unremarkable. IMPRESSION: Interval placement of feeding tube seen entering stomach. Otherwise stable support apparatus. Stable bibasilar opacities  are noted. Electronically Signed   By: Marijo Conception M.D.   On: 10/03/2021 11:39   DG Abd Portable 1V  Result Date: 10/03/2021 CLINICAL DATA:  Feeding tube placement EXAM: PORTABLE ABDOMEN - 1 VIEW COMPARISON:  10/03/2021 FINDINGS: Feeding tube with the tip projecting along the mid abdomen which may be coiled within a distended stomach versus duodenum. No bowel dilatation to suggest obstruction. No evidence of pneumoperitoneum, portal venous gas or pneumatosis. No pathologic calcifications along the expected course of the ureters. No acute osseous abnormality. IMPRESSION: Feeding tube with the tip projecting along the mid abdomen which may be coiled within a distended stomach versus duodenum. Electronically Signed   By: Kathreen Devoid M.D.   On: 10/03/2021 12:19     Medications:     Scheduled Medications:  sodium chloride   Intravenous Once   sodium chloride   Intravenous Once   sodium chloride   Intravenous Once   sodium chloride   Intravenous Once   sodium chloride   Intravenous Once   acetaminophen  500 mg Oral Q6H   amiodarone  200 mg Oral BID   aspirin  81 mg Oral Daily   Chlorhexidine Gluconate Cloth  6 each Topical Daily   docusate  200 mg Per Tube  Daily   feeding supplement  237 mL Oral TID BM   mouth rinse  15 mL Mouth Rinse BID   metoCLOPramide (REGLAN) injection  5 mg Intravenous Q8H   midodrine  15 mg Oral TID WC   montelukast  5 mg Oral QHS   multivitamin  1 tablet Oral QHS   pantoprazole  40 mg Oral Daily   polyethylene glycol  17 g Oral Daily   rosuvastatin  10 mg Oral Daily   sodium chloride flush  10-40 mL Intracatheter Q12H   sodium chloride flush  3 mL Intravenous Q12H   Warfarin - Pharmacist Dosing Inpatient   Does not apply q1600    Infusions:   prismasol BGK 4/2.5 400 mL/hr at 10/05/21 0511    prismasol BGK 4/2.5 200 mL/hr at 10/04/21 2132   sodium chloride Stopped (09/20/21 0304)   sodium chloride Stopped (09/18/21 0803)   sodium chloride Stopped (10/02/21 0900)   epinephrine Stopped (10/01/21 1017)   feeding supplement (PIVOT 1.5 CAL) 40 mL/hr at 10/05/21 0600   lactated ringers     milrinone 0.25 mcg/kg/min (10/05/21 0600)   norepinephrine (LEVOPHED) Adult infusion 3 mcg/min (10/05/21 0600)   prismasol BGK 4/2.5 1,800 mL/hr at 10/05/21 0511   promethazine (PHENERGAN) injection (IM or IVPB) Stopped (10/01/21 1714)    PRN Medications: sodium chloride, sodium chloride, albuterol, heparin, heparin, lip balm, morphine injection, ondansetron (ZOFRAN) IV, oxyCODONE, polyethylene glycol, promethazine (PHENERGAN) injection (IM or IVPB), sodium chloride flush, sodium chloride flush, traMADol   Assessment/Plan:    1. Acute on chronic systolic CHF:  Long-standing cardiomyopathy.  [place].  Echo this admission with EF < 20%, severe LV dilation, restrictive diastolic function, moderate RV dysfunction, moderate MR, mod-severe TR. Cause of cardiomyopathy is uncertain.  He has a history of CAD, but I do not think that the described CAD from the past could explain his cardiomyopathy, but CAD could have progressed.  With difficulty tolerating GDMT/need for midodrine and cardiorenal syndrome as well as profound  volume overload + NYHA class IV symptoms,  concerned for low output HF. Co-ox off milrinone was 36%, milrinone started and increased to 0.375 but CO remained low. NE added and Impella 5.5 placed 12/23.  EF 10% on TEE 12/23. s/p HM III VAD on 12/28.  Ramp echo 01/03 with EF 20-25%, RV moderately reduced, septum mid-line.  Ramp echo again on 1/12 showed severely dilated/moderately dysfunctional RV with severe TR and left-shifted septum with small LV.  Speed decreased on 1/12 to 5000 rpm over 2 sessions due to RV failure and milrinone 0.25 + NE 3 restarted.  Able to increase CVVH, weight down significantly.  MAP 70s-80s today, CVP 13 with co-ox 53% (early am).  No PI events on device interrogation (improved). Walked around unit yesterday.  Getting fluid off should help his RV. Stable LDH.  - Continue milrinone 0.25 mcg/kg/min, may need to go home on this for RV support.  - Will try to start weaning down NE today.  - Continue midodrine 15 mg tid.  - Continue CVVH, aim for about 100 cc/hr net negative UF.  Will probably need CVP around 10 for RV function.   - He does not have BP room for sildenafil.  - Warfarin for INR 2-2.5.  INR 1.6 today, adjust warfarin.   - Continue 81 mg aspirin - Continue UNNA boots 2. Tricuspid regurgitation:  Tricuspid repair not done at time of VAD d/t proximity of ICD wires and hypotension during surgery.  He has severe TR.  3. Atrial fibrillation: Persistent since 10/22 based on device interrogation.  Was in junctional rhythm, now probably NSR. - Continue PO amio 200 bid  4. AKI on CKD stage 3: Gradual rise (was about 1.7 pre-op) since surgery.  He has not been hypotensive post-op, suspect intra-op hypotension led to development of ATN => urine sediment looked like ATN per renal. Now on CVVH. TDC placed 1/7.  - Maintain MAP and CO as above - Pulling for net 100 cc/hr UF.  - Nephrology following, suspect he will need iHD eventually.    5. CAD: History of PCI to OM1 in 2007 and  RCA in 2013.  No CP or ACs.  - Deferred cardiac cath due to AKI and plan for VAD - Continue Crestor.  6. ID: Had fever 12/21, PCT 1.03.  Blood cultures negative. ?Phlebitis at IV site. Post-op fever with elevated WBCs, PCT 2.63 -> 1.78.  He completed course of cefepime. Afebrile with stable creatinine.  7. Constipation: Continue aggressive bowel regimen. improved 8. Hyponatremia: Hypervolemic hyponatremia.  Controlled via CVVH.  9. Anemia: 1 unit PRBCs on 1/12,  1 unit PRBs 1/13. No overt bleeding, ?losses via CVVH.  Hgb 8.3 today.  - Transfuse < 7.5.  10. Confusion/delirium: Initially post-op and started on Precedex.  This has cleared, now up and has been walking halls 11. F/E/N: TFs via [place] for poor nutritional status.  Improving po intake.  - Continue TFs today.  12. Left pleural effusion: - s/p thoracentesis 1/7 - s/p thoracentesis 1/12.  Walk in hall today.   I reviewed the LVAD parameters from today, and compared the results to the patient's prior recorded data.  No programming changes were made.  The LVAD is functioning within specified parameters.  The patient performs LVAD self-test daily.  LVAD interrogation was negative for any significant power changes, alarms or PI events/speed drops.  LVAD equipment check completed and is in good working order.  Back-up equipment present.   LVAD education done on emergency procedures and precautions and reviewed exit site care.  CRITICAL CARE Performed by: Loralie Champagne  Total critical care time: 40 minutes  Critical care time was exclusive of separately billable procedures and treating other patients.  Critical care was necessary to treat or prevent imminent or life-threatening deterioration.  Critical care was time spent personally by me on the following activities: development of treatment plan with patient and/or surrogate as well as nursing, discussions with consultants, evaluation of patient's response to treatment, examination of  patient, obtaining history from patient or surrogate, ordering and performing treatments and interventions, ordering and review of laboratory studies, ordering and review of radiographic studies, pulse oximetry and re-evaluation of patient's condition.  Loralie Champagne 10/05/2021 8:19 AM

## 2021-10-05 NOTE — Progress Notes (Signed)
[facility] for Warfarin  Indication: LVAD/AF  Allergies  Allergen Reactions   Mushroom Extract Complex Nausea And Vomiting   Neosporin [Neomycin-Bacitracin Zn-Polymyx] Hives    Patient Measurements: Height: 5\' 9"  (175.3 cm) Weight: 72.7 kg (160 lb 4.4 oz) IBW/kg (Calculated) : 70.7 Heparin Dosing Weight: 83.7 kg  Vital Signs: Temp: 97.6 F (36.4 C) (01/15 0734) Temp Source: Oral (01/15 0734) BP: 81/59 (01/15 0700) Pulse Rate: 70 (01/15 0700)  Labs: Recent Labs    10/03/21 0406 10/03/21 1620 10/04/21 0413 10/04/21 1558 10/05/21 0408  HGB 7.2*  --  8.6*  --  8.3*  HCT 22.4*  --  27.3*  --  26.5*  PLT 534*  --  488*  --  434*  LABPROT 21.9*  --  19.9*  --  18.6*  INR 1.9*  --  1.7*  --  1.6*  CREATININE 1.48*   < > 1.34* 1.42* 1.38*   < > = values in this interval not displayed.     Estimated Creatinine Clearance: 50.5 mL/min (A) (by C-G formula based on SCr of 1.38 mg/dL (H)).   Medical History: Past Medical History:  Diagnosis Date   Arrhythmia    atrial fibrillation   CHF (congestive heart failure) (HCC)    Chronic kidney disease    Coronary artery disease    Hyperlipidemia    Hypertension    Myocardial infarct [facility])     Assessment: 71 yo male on chronic Eliquis PTA for afib.  Now s/p LVAD placement 09/17/21, pharmacy asked to begin anticoagulation with warfarin.   INR has been therapeutic patient was not eating and INR dropped with initiation of tube feeds. Patient also had better appetite the last few days eating majority of meals. Today INR is 1.6 with 2 mg boosted dose yesterday. EPW out. LDH  stable at 200s-300s and hemoglobin low stable,no overt bleeding noted. Prn PRBCs.   Suspect will need higher warfarin dose to maintain INR in range with increased intake, but still not seeing the effect of the 2 mg doses from the last 2 days. Will repeat 2 mg tonight. INR should reflect higher dose starting 1/16.  Goal of  Therapy:  INR 2-2.5 Monitor platelets by anticoagulation protocol: Yes   Plan:  -Warfarin 2 mg PO x1 today -Daily INR, CBC, LDH -May need to further increase warfarin dose with tube feeds  Cathrine Muster, PharmD PGY2 Cardiology Pharmacy Resident Phone: [phone] 10/05/2021  10:57 AM  Please check [email or web address] for unit-specific pharmacy phone numbers.

## 2021-10-06 ENCOUNTER — Inpatient Hospital Stay (HOSPITAL_COMMUNITY): Payer: Medicare HMO

## 2021-10-06 DIAGNOSIS — R570 Cardiogenic shock: Secondary | ICD-10-CM | POA: Diagnosis not present

## 2021-10-06 DIAGNOSIS — I5023 Acute on chronic systolic (congestive) heart failure: Secondary | ICD-10-CM

## 2021-10-06 DIAGNOSIS — Z95811 Presence of heart assist device: Secondary | ICD-10-CM

## 2021-10-06 LAB — RENAL FUNCTION PANEL
Albumin: 1.9 g/dL — ABNORMAL LOW (ref 3.5–5.0)
Albumin: 2.1 g/dL — ABNORMAL LOW (ref 3.5–5.0)
Anion gap: 7 (ref 5–15)
Anion gap: 7 (ref 5–15)
BUN: 22 mg/dL (ref 8–23)
BUN: 25 mg/dL — ABNORMAL HIGH (ref 8–23)
CO2: 27 mmol/L (ref 22–32)
CO2: 26 mmol/L (ref 22–32)
Calcium: 7.9 mg/dL — ABNORMAL LOW (ref 8.9–10.3)
Calcium: 8.1 mg/dL — ABNORMAL LOW (ref 8.9–10.3)
Chloride: 97 mmol/L — ABNORMAL LOW (ref 98–111)
Chloride: 102 mmol/L (ref 98–111)
Creatinine, Ser: 1.51 mg/dL — ABNORMAL HIGH (ref 0.61–1.24)
Creatinine, Ser: 1.47 mg/dL — ABNORMAL HIGH (ref 0.61–1.24)
GFR, Estimated: 50 mL/min — ABNORMAL LOW (ref >60)
GFR, Estimated: 51 mL/min — ABNORMAL LOW (ref >60)
Glucose, Bld: 203 mg/dL — ABNORMAL HIGH (ref 70–99)
Glucose, Bld: 120 mg/dL — ABNORMAL HIGH (ref 70–99)
Phosphorus: 1.3 mg/dL — ABNORMAL LOW (ref 2.5–4.6)
Phosphorus: 1.4 mg/dL — ABNORMAL LOW (ref 2.5–4.6)
Potassium: 4.9 mmol/L (ref 3.5–5.1)
Potassium: 5.2 mmol/L — ABNORMAL HIGH (ref 3.5–5.1)
Sodium: 131 mmol/L — ABNORMAL LOW (ref 135–145)
Sodium: 135 mmol/L (ref 135–145)

## 2021-10-06 LAB — COOXEMETRY PANEL
Carboxyhemoglobin: 2 % — ABNORMAL HIGH (ref 0.5–1.5)
Methemoglobin: 0.9 % (ref 0.0–1.5)
O2 Saturation: 61.3 %
Total hemoglobin: 8.2 g/dL — ABNORMAL LOW (ref 12.0–16.0)

## 2021-10-06 LAB — CBC
HCT: 25.7 % — ABNORMAL LOW (ref 39.0–52.0)
Hemoglobin: 7.8 g/dL — ABNORMAL LOW (ref 13.0–17.0)
MCH: 28.9 pg (ref 26.0–34.0)
MCHC: 30.4 g/dL (ref 30.0–36.0)
MCV: 95.2 fL (ref 80.0–100.0)
Platelets: 404 10*3/uL — ABNORMAL HIGH (ref 150–400)
RBC: 2.7 MIL/uL — ABNORMAL LOW (ref 4.22–5.81)
RDW: 21.6 % — ABNORMAL HIGH (ref 11.5–15.5)
WBC: 10.9 10*3/uL — ABNORMAL HIGH (ref 4.0–10.5)
nRBC: 0.3 % — ABNORMAL HIGH (ref 0.0–0.2)

## 2021-10-06 LAB — IRON AND TIBC
Iron: 32 ug/dL — ABNORMAL LOW (ref 45–182)
Saturation Ratios: 12 % — ABNORMAL LOW (ref 17.9–39.5)
TIBC: 269 ug/dL (ref 250–450)
UIBC: 237 ug/dL

## 2021-10-06 LAB — ECHOCARDIOGRAM COMPLETE
AV Peak grad: 18.8 mmHg
Ao pk vel: 2.17 m/s
Area-P 1/2: 3.89 cm2
Height: 69 in
S' Lateral: 3.25 cm
Weight: 2564.39 oz

## 2021-10-06 LAB — GLUCOSE, CAPILLARY
Glucose-Capillary: 102 mg/dL — ABNORMAL HIGH (ref 70–99)
Glucose-Capillary: 124 mg/dL — ABNORMAL HIGH (ref 70–99)
Glucose-Capillary: 73 mg/dL (ref 70–99)
Glucose-Capillary: 115 mg/dL — ABNORMAL HIGH (ref 70–99)
Glucose-Capillary: 108 mg/dL — ABNORMAL HIGH (ref 70–99)

## 2021-10-06 LAB — FERRITIN: Ferritin: 477 ng/mL — ABNORMAL HIGH (ref 24–336)

## 2021-10-06 LAB — PROTIME-INR
INR: 1.4 — ABNORMAL HIGH (ref 0.8–1.2)
Prothrombin Time: 17.1 seconds — ABNORMAL HIGH (ref 11.4–15.2)

## 2021-10-06 LAB — MAGNESIUM: Magnesium: 2.4 mg/dL (ref 1.7–2.4)

## 2021-10-06 LAB — BODY FLUID CULTURE W GRAM STAIN
Culture: NO GROWTH
Gram Stain: NONE SEEN

## 2021-10-06 LAB — PREPARE RBC (CROSSMATCH)

## 2021-10-06 LAB — HEPARIN LEVEL (UNFRACTIONATED): Heparin Unfractionated: <0.1 IU/mL — ABNORMAL LOW (ref 0.30–0.70)

## 2021-10-06 LAB — LACTATE DEHYDROGENASE: LDH: 270 U/L — ABNORMAL HIGH (ref 98–192)

## 2021-10-06 LAB — PATHOLOGIST SMEAR REVIEW

## 2021-10-06 MED ORDER — WARFARIN SODIUM 2 MG PO TABS
4.0000 mg | ORAL_TABLET | Freq: Once | ORAL | Status: AC
  Administered 2021-10-06: 4 mg via ORAL
  Filled 2021-10-06: qty 2

## 2021-10-06 MED ORDER — SODIUM CHLORIDE 0.9 % IV SOLN
200.0000 mg | INTRAVENOUS | Status: AC
  Administered 2021-10-06 – 2021-10-10 (×5): 200 mg via INTRAVENOUS
  Filled 2021-10-06 (×7): qty 10

## 2021-10-06 MED ORDER — HEPARIN (PORCINE) 25000 UT/250ML-% IV SOLN
500.0000 [IU]/h | INTRAVENOUS | Status: DC
  Administered 2021-10-06 – 2021-10-07 (×2): 500 [IU]/h via INTRAVENOUS
  Filled 2021-10-06 (×3): qty 250

## 2021-10-06 MED ORDER — SODIUM PHOSPHATES 45 MMOLE/15ML IV SOLN
30.0000 mmol | Freq: Once | INTRAVENOUS | Status: AC
  Administered 2021-10-06: 30 mmol via INTRAVENOUS
  Filled 2021-10-06: qty 10

## 2021-10-06 MED ORDER — SODIUM CHLORIDE 0.9% IV SOLUTION: Freq: Once | INTRAVENOUS | Status: AC

## 2021-10-06 NOTE — Progress Notes (Signed)
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 Acute component either from cardiorenal +/- ATN with decreased renal function from hemodynamics and CPB + hypotensive episodes + pressors.  - CRRT started on 1/3.       - back on NE and milrinone, weaning NE as tolerated     - UF as tolerated today- continue net negative 100-200 mL/ hr    - all 4K, no heparin intracircuit     -Cont monitoring daily I/Os + weights  -Maintain MAP>65 for optimal renal perfusion.  -Avoid nephrotoxic medications including NSAIDs, contrast. - Dose medications for a GFR <15 ml/min and also on CRRT.   Cardiogenic shock: s/p LVAD 09/17/22 (HM 3).   - TTE to assess for RV failure 10/02/21--> RV function down - VAD speed decreased 10/02/21 - on NE and milrinone again - symptomatic improvement.  Co-ox overall improved  CAD with h/o PCI last in 2013  Anemia- iron indices 1/16 showing iron sat 12%, ferritin 477, Aranesp 10/06/21. Give IV iron x 1g over 5 doses.   Afib- on amio  Acute hypoxic RF: s/p thoracentesis 1.3 L 10/02/21  Dispo: ICU, ultimately will need to be able to tolerate iHD, cont CRRT for now  Subjective:    Continues to walk every morning, not today yet.  Eating more.   Son bedside.   Objective:   BP (!) 108/52    Pulse 78    Temp 98.3 F (36.8 C) (Oral)    Resp 15    Ht 5\' 9"  (1.753 m)    Wt 72.7 kg    SpO2 96%    BMI 23.67 kg/m   Intake/Output Summary (Last 24 hours) at 10/06/2021 0845 Last data filed at 10/06/2021 0800 Gross per 24 hour  Intake 919.31 ml  Output 2105 ml  Net -1185.69 ml    Weight change:   Physical Exam: GEN: NAD, A&Ox3 sitting in bed HEENT: wearing glasses LUNGS: normal WOB, clear ant CV: LVAD hum ABD: + bowel sounds EXT: 1+ lower extremity edema and also present in the UE, much improved, legs wrapped at tibial level ACCESS: rt IJ TC c/d/i   Imaging: No results  found.  Labs: BMET Recent Labs  Lab 10/03/21 0406 10/03/21 1620 10/04/21 0413 10/04/21 1558 10/05/21 0408 10/05/21 1620 10/06/21 0258  NA 134* 136 137 133* 135 133* 131*  K 4.4 4.0 4.4 4.9 5.0 5.2* 4.9  CL 102 102 100 102 100 98 97*  CO2 27 26 28 27 27 27 27   GLUCOSE 193* 149* 117* 141* 109* 145* 203*  BUN 17 17 15 18 19 21 22   CREATININE 1.48* 1.39* 1.34* 1.42* 1.38* 1.42* 1.51*  CALCIUM 7.9* 7.6* 8.4* 7.8* 8.2* 8.0* 7.9*  PHOS <1.0* 2.3* 1.9*   1.9* 1.7*   1.7* 1.7*   1.7* 1.3* 1.3*    CBC Recent Labs  Lab 10/03/21 0406 10/04/21 0413 10/05/21 0408 10/06/21 0258  WBC 16.2* 14.5* 13.0* 10.9*  HGB 7.2* 8.6* 8.3* 7.8*  HCT 22.4* 27.3* 26.5* 25.7*  MCV 89.2 91.0 93.6 95.2  PLT 534* 488* 434* 404*     Medications:     sodium chloride   Intravenous Once   sodium chloride   Intravenous Once   sodium chloride   Intravenous Once   sodium chloride   Intravenous Once   sodium chloride   Intravenous Once   sodium chloride  Intravenous Once   acetaminophen  500 mg Oral Q6H   amiodarone  200 mg Oral BID   aspirin  81 mg Oral Daily   Chlorhexidine Gluconate Cloth  6 each Topical Daily   darbepoetin (ARANESP) injection - NON-DIALYSIS  60 mcg Subcutaneous Q Mon-1800   docusate  200 mg Per Tube Daily   feeding supplement  237 mL Oral TID BM   mouth rinse  15 mL Mouth Rinse BID   metoCLOPramide (REGLAN) injection  5 mg Intravenous Q8H   midodrine  15 mg Oral TID WC   montelukast  5 mg Oral QHS   multivitamin  1 tablet Oral QHS   pantoprazole  40 mg Oral Daily   polyethylene glycol  17 g Oral Daily   rosuvastatin  10 mg Oral Daily   sodium chloride flush  10-40 mL Intracatheter Q12H   sodium chloride flush  3 mL Intravenous Q12H   Warfarin - Pharmacist Dosing Inpatient   Does not apply q1600   Jannifer Hick MD [facility] Pager [phone]

## 2021-10-06 NOTE — Progress Notes (Addendum)
[place] for Warfarin  Indication: LVAD/AF  Allergies  Allergen Reactions   Mushroom Extract Complex Nausea And Vomiting   Neosporin [Neomycin-Bacitracin Zn-Polymyx] Hives    Patient Measurements: Height: 5\' 9"  (175.3 cm) Weight: 72.7 kg (160 lb 4.4 oz) IBW/kg (Calculated) : 70.7 Heparin Dosing Weight: 83.7 kg  Vital Signs: Temp: 97.7 F (36.5 C) (01/16 0400) Temp Source: Oral (01/16 0400) BP: 108/52 (01/16 0600) Pulse Rate: 78 (01/16 0600)  Labs: Recent Labs    10/04/21 0413 10/04/21 1558 10/05/21 0408 10/05/21 1620 10/06/21 0258  HGB 8.6*  --  8.3*  --  7.8*  HCT 27.3*  --  26.5*  --  25.7*  PLT 488*  --  434*  --  404*  LABPROT 19.9*  --  18.6*  --  17.1*  INR 1.7*  --  1.6*  --  1.4*  CREATININE 1.34*   < > 1.38* 1.42* 1.51*   < > = values in this interval not displayed.     Estimated Creatinine Clearance: 46.2 mL/min (A) (by C-G formula based on SCr of 1.51 mg/dL (H)).   Medical History: Past Medical History:  Diagnosis Date   Arrhythmia    atrial fibrillation   CHF (congestive heart failure) (HCC)    Chronic kidney disease    Coronary artery disease    Hyperlipidemia    Hypertension    Myocardial infarct [facility])     Assessment: 71 yo male on chronic Eliquis PTA for afib.  Now s/p LVAD placement 09/17/21, pharmacy asked to begin anticoagulation with warfarin.   INR today came back subtherapeutic at 1.4 - continuing to drift down. Hgb 7.8, plt 404. LDH stable in 200-300s. Oral intake between 25-75%. Discussed with Dr Aundra Dubin, given subtherapeutic INR will add in low dose fixed heparin.   Goal of Therapy:  INR 2-2.5 Monitor platelets by anticoagulation protocol: Yes   Plan:  -Warfarin 4 mg PO x1 today -Order heparin 500 units/hr fixed dose - will check heparin level in 6 hours to make sure not high -Daily INR, CBC, LDH  Antonietta Jewel, PharmD, [place] Pharmacist  Phone: [phone] 10/06/2021 7:57  AM  Please check AMION for all [place] phone numbers After 10:00 PM, call [place] [phone]  ADDENDUM Heparin level came back undetectable, on 500 units/hr. No s/sx of bleeding or infusion issues. Continue heparin 500 units/hr until INR >/= 1.8 and monitor daily levels.   Antonietta Jewel, PharmD, [place] Clinical Pharmacist

## 2021-10-06 NOTE — Progress Notes (Signed)
Nutrition Follow-up  DOCUMENTATION CODES:   Severe malnutrition in context of chronic illness  INTERVENTION:   Recommend aggressive daily supplementation of phosphorus while on CRRT given increased losses and current phos 1.3. Discussed with Pharmacy Team, RN  Tube Feeding via Cortrak: Continue Pivot 1.5 at 30 ml/hr, goal rate of 65 ml/hr Goal rate provide 2340 kals, 146  g of protein 1186 mL of free water  Continue Ensure Enlive po TID, each supplement provides 350 kcal and 20 grams of protein  NUTRITION DIAGNOSIS:   Severe Malnutrition related to chronic illness (heart failure) as evidenced by severe fat depletion, severe muscle depletion.  Being addressed via TF   GOAL:   Patient will meet greater than or equal to 90% of their needs  Progressing  MONITOR:   PO intake, Supplement acceptance, Labs, Weight trends  REASON FOR ASSESSMENT:   Consult LVAD Eval  ASSESSMENT:   71 yo male admitted with acute on chronic CHF. PMH includes CHF, CAD, CKD, HLD, HTN  12/23 Impella 5.5 placed 12/28 HM3 LVAD plaecd, Impella removed, Intubated 12/29 Extubated 12/31 FL diet 01/02 Diet advanced to 2g sodium 01/03 CRRT initiated 01/11 Calorie count indicating inadequate oral intake, Cortrak recommended 01/12 Thoracentesis with 1.3 L removed 01/13 Cortrak placed, TF initiated    Pt remains on CRRT-UF as tolerated-goal net negative 100-200 ml/hr, remains on levophed Pt continues to walk daily  Pt reports to RN that he is hungry but has not eaten anything today other than protein smoothie from wife.   Recorded po intake yesterday was 75% at breakfast, 25% at dinner; nothing recorded for lunch.   Tolerating Pivot 1.5 at 65 ml/hr via Cortrak Given pt still indicating that he is hungry despite receiving TF 24 hours today, plan to hold on transitioning to nocturnal TF.   Phosphorus trending down over the weekend, level this AM 1.3. No order for supplementation noted. Discussed with  Pharmacy team who plans to discuss with MD.   Labs: phopshorus 1.2 (L),. Sodium 131, CBGs 43-112 Meds: reglan, renal MVI  Diet Order:   Diet Order             Diet regular Room service appropriate? Yes; Fluid consistency: Thin  Diet effective now                   EDUCATION NEEDS:   Education needs have been addressed  Skin:  Skin Assessment: Reviewed RN Assessment  Last BM:  1/15  Height:   Ht Readings from Last 1 Encounters:  10/02/21 5\' 9"  (1.753 m)    Weight:   Wt Readings from Last 1 Encounters:  10/05/21 72.7 kg     BMI:  Body mass index is 23.67 kg/m.  Estimated Nutritional Needs:   Kcal:  2300-2500 kcals  Protein:  135-160 g  Fluid:  1.8 L    Kerman Passey MS, RDN, LDN, CNSC Registered Dietitian III Clinical Nutrition RD Pager and On-Call Pager Number Located in [place]

## 2021-10-06 NOTE — Progress Notes (Signed)
Outpatient HF CSW met with pt at bedside to check in.  Pt appears much more alert than previous visits I have made.  Reports he is doing ok but expresses he wishes he were closer to being ready to leave the hospital.  Pt inquired how this was affected pt mental health but pt reports he has good support system so he is managing ok- reports no needs at this time. ° °Will continue to follow during this admission and assist as needed ° °Jenna H. Uris, LCSW °Clinical Social Worker °[facility] °Desk#: [phone] °Cell#: [phone] ° °

## 2021-10-06 NOTE — Progress Notes (Signed)
Physical Therapy Treatment Patient Details Name: Isaac [name] MRN: [id number] DOB: 02-05-1951 Today's Date: 10/06/2021   History of Present Illness Pt is a 71 y.o. male admitted 09/07/21 with near-syncope, nausea, poor oral intake, generalized weakness. Workup for advanced heart failure and persistent inotropic with requirement, cardiogenic shock. [place] 12/19. Impella placed on 12/23. LVAD placed 12/28. Pt with post-op delirium and agitation. CRRT initiated 1/3. S/p thoracentesis 1/12. S/p cortrak placement 1/13. PMH includes advanced CHF (LVEF <20%), AICD/PPM, PAF on Eliquis, CKD 3, HTN, HLD.    PT Comments    Pt OOB to chair but pt deferred ambulation due to issues with his bowels. Pt reports decr control of bowels due to stool softeners etc and reports when he starts moving he can't control bowels. Understandably he doesn't want this to happen in the hallway. Will continue to work toward incr mobility.    Recommendations for follow up therapy are one component of a multi-disciplinary discharge planning process, led by the attending physician.  Recommendations may be updated based on patient status, additional functional criteria and insurance authorization.  Follow Up Recommendations  Acute inpatient rehab (3hours/day)     Assistance Recommended at Discharge    Patient can return home with the following A little help with walking and/or transfers;A little help with bathing/dressing/bathroom;Assistance with cooking/housework;Help with stairs or ramp for entrance;Assist for transportation   Equipment Recommendations  Rollator (4 wheels);BSC/3in1    Recommendations for Other Services       Precautions / Restrictions Precautions Precautions: Fall;Sternal;Other (comment) Precaution Comments: LVAD, CRRT     Mobility  Bed Mobility Overal bed mobility: Needs Assistance Bed Mobility: Supine to Sit     Supine to sit: Min assist     General bed mobility comments: Assist to  elevate trunk into sitting    Transfers Overall transfer level: Needs assistance Equipment used: None Transfers: Sit to/from Stand;Bed to chair/wheelchair/BSC Sit to Stand: Min assist     Step pivot transfers: Min assist;+2 safety/equipment     General transfer comment: Assist to bring hips up and for stability. Assist for balance with pivotal steps to chair    Ambulation/Gait               General Gait Details: Pt deferred due to pt with decr control of bowels after stool softeners   Stairs             Wheelchair Mobility    Modified Rankin (Stroke Patients Only)       Balance Overall balance assessment: Needs assistance Sitting-balance support: No upper extremity supported;Feet supported Sitting balance-Leahy Scale: Fair     Standing balance support: No upper extremity supported Standing balance-Leahy Scale: Fair Standing balance comment: Stood x 3 minutes without min guard for safety/lines                            Cognition Arousal/Alertness: Awake/alert Behavior During Therapy: WFL for tasks assessed/performed;Flat affect Overall Cognitive Status: Within Functional Limits for tasks assessed                                          Exercises General Exercises - Lower Extremity Long Arc Quad: AROM;Both;10 reps;Seated Hip Flexion/Marching: AROM;Both;10 reps;Seated    General Comments        Pertinent Vitals/Pain Pain Assessment: Faces Faces Pain Scale: Hurts a little bit  Pain Location: generalized Pain Descriptors / Indicators: Grimacing Pain Intervention(s): Monitored during session    Home Living                          Prior Function            PT Goals (current goals can now be found in the care plan section) Acute Rehab PT Goals Patient Stated Goal: home PT Goal Formulation: With patient Time For Goal Achievement: 10/20/21 Potential to Achieve Goals: Fair Additional  Goals Additional Goal #1: Pt will follow sternal precautions with mobility Progress towards PT goals: Goals downgraded-see care plan    Frequency    Min 3X/week      PT Plan Current plan remains appropriate    Co-evaluation              AM-PAC PT "6 Clicks" Mobility   Outcome Measure  Help needed turning from your back to your side while in a flat bed without using bedrails?: A Little Help needed moving from lying on your back to sitting on the side of a flat bed without using bedrails?: A Little Help needed moving to and from a bed to a chair (including a wheelchair)?: A Little Help needed standing up from a chair using your arms (e.g., wheelchair or bedside chair)?: A Lot Help needed to walk in hospital room?: A Little Help needed climbing 3-5 steps with a railing? : Total 6 Click Score: 15    End of Session Equipment Utilized During Treatment: Oxygen Activity Tolerance: Other (comment) (bowel issues) Patient left: with call bell/phone within reach;in chair Nurse Communication: Mobility status (nurse present) PT Visit Diagnosis: Unsteadiness on feet (R26.81);Other abnormalities of gait and mobility (R26.89);Muscle weakness (generalized) (M62.81)     Time: 1829-9371 PT Time Calculation (min) (ACUTE ONLY): 22 min  Charges:  $Therapeutic Activity: 8-22 mins                     [place] Pager [phone] Office [place] 10/06/2021, 3:59 PM

## 2021-10-06 NOTE — Progress Notes (Signed)
LVAD Coordinator Rounding Note:  Admitted 09/06/21 due to CHF exacerbation to Dr Claris Gladden service. He was started on Milrinone on admission. Febrile n 09/10/21- suspected to have UTI- antibiotics started. Impella 5.5 placed 09/12/21.   HM III LVAD implanted on 09/17/21 by Dr Cyndia Bent under destination therapy criteria. 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   Pt lying in bed resting. Reports he is tired after walking in the hallway this morning. Denies pain. VAD speed 5000 per RAMP echo last week- tolerating well.   Hgb 7.8 this morning; no bleeding noted.- Receiving 1 unit PRBCs. Receiving Iron Sucrose x 5 doses for iron deficiency.   CRRT pulling 100-150 cc/hr based on BP. Continues of Levophed drip. CVP 14-15 this morning.   Tolerating tube feeds via core-track. Appetite is improving.   Vital signs: Temp: 98.5 HR: 60 Doppler Pressure: 98 Auto Cuff BP: 78/68 (73) O2 Sat: 97% on 3L [place] Wt: 185.2>196.8>194>189.1>192.2>198.4>189.6>188.7>183.6>177>160.2 lbs    LVAD interrogation reveals:  Speed: 5000 Flow: 4.4 Power: 3.4 w PI: 2.4 Hematocrit: 22  Alarms: none Events: none  Fixed speed: 5600 Low speed limit: 5300  Drive Line: Existing VAD dressing removed and site care performed using sterile technique. Drive line exit site cleaned with Chlora prep applicators x 2, allowed to dry, and gauze dressing with silver strip applied. Exit site not incorporated, the velour is fully implanted at exit site. 1 suture in place. Scant amount of dried tissue fat necrosis drainage noted at site and on gauze. Slight redness, no tenderness, foul odor or rash noted. Drive line anchor secure. Will continue M/W/F dressing changes per Nurse Davonna Belling, [place] coordinator, or trained caregiver.  Next dressing change due: 10/08/21.     Labs:  LDH trend: 344>379>370>378>380>331>296>281>291>293>295>270  INR trend:  1.6>1.8>2.8>2.9>2.1>2.0>2.2>2.4>2.4>2.5>2.3>1.4  WBC trend: 14.1>13.6>13.1>11>8.7>10.1>13.5>14>14.2>14.3>13.2>10.9  Cr trend: 2.11>2.39>5.24>3.45>2.38>1.97>1.87>1.68>1.73>1.60>1.49>1.52>1.51  Hgb trend: 6.5>7.5>8.1>8.8>7.7>8.6>7.7>7.8  Anticoagulation Plan: -INR Goal: 2.0 - 2.5 -ASA Dose: 81 mg daily   Blood Products:  Intra Op: 09/17/21  1 PLT  4 FFP   4 PRBC  Cellsaver- 803 cc Post Op:  - 09/17/21>>1 PRBC - 09/18/21>> 3 PRBCs - 09/19/21>> 1 PRBC -  09/26/21>> 1 PRBC - 10/02/21>>1 PRBC - 10/06/21 >> 1 PRBC  Device: - [employer] dual -Therapies: OFF  *Will need to be referred to device clinic at discharge*  Arrythmias: 3 episodes of VT that required defib while in OR 09/17/21. Currently on Amiodarone gtt. Remains in afib.   Respiratory: extubated 09/18/22  Nitric Oxide:  off 09/18/22  Renal:  09/23/21>>started CVVH  Infection:  09/10/21>> blood cultures >> No growth 5 days; final 09/12/21>> urine culture >> No growth; final  09/25/21>>blood cultures>>negative  09/27/21>>pleural effusion>>negative, final 10/02/21>> pleural effusion >> no growth; final   Drips:  Milrinone 0.25 mcg/kg/min (restarted 10/02/21) Levo 3 mcg/min Tube feed 65 cc/hr Heparin 500 units/hr  Patient Education: No family at bedside. Patient tired and in bed. Not appropriate for teaching at this time  Plan/Recommendations:  1. [place] VAD coordinator for drive line or equipment concerns 2. Daily drive line dressing change per Nurse Davonna Belling or [place] coordinator  Emerson Monte RN [place] Coordinator  Office: [phone]  24/7 Pager: [phone]

## 2021-10-06 NOTE — Plan of Care (Signed)
°  Problem: Health Behavior/Discharge Planning: Goal: Ability to manage health-related needs will improve Outcome: Progressing   Problem: Clinical Measurements: Goal: Ability to maintain clinical measurements within normal limits will improve Outcome: Progressing Goal: Will remain free from infection Outcome: Progressing   Problem: Activity: Goal: Risk for activity intolerance will decrease Outcome: Progressing   Problem: Nutrition: Goal: Adequate nutrition will be maintained Outcome: Progressing   Problem: Coping: Goal: Level of anxiety will decrease Outcome: Progressing   Problem: Pain Managment: Goal: General experience of comfort will improve Outcome: Progressing   Problem: Safety: Goal: Ability to remain free from injury will improve Outcome: Progressing

## 2021-10-06 NOTE — Progress Notes (Signed)
Echocardiogram 2D Echocardiogram has been performed.  Isaac [name] 10/06/2021, 11:34 AM

## 2021-10-06 NOTE — TOC CM/SW Note (Signed)
HF TOC CM reviewed PT notes and recommendation is for IP rehab. Will need attending put in consult for IP rehab/CIR to review referral. Jonnie Finner RN3 CCM, Heart Failure TOC CM [phone]

## 2021-10-06 NOTE — Progress Notes (Signed)
OT Cancellation Note  Patient Details Name: Isaac [name] MRN: [id number] DOB: 1951/02/15   Cancelled Treatment:    Reason Eval/Treat Not Completed:  Pt declined, reports having just worked with PT and having loose bowels earlier today.  [name] 10/06/2021, 4:21 PM Nestor Lewandowsky, OTR/L Acute Rehabilitation Services Pager: [phone] Office: [phone]

## 2021-10-06 NOTE — Progress Notes (Signed)
Patient ID: Isaac [name], male   DOB: 10-30-1950, 71 y.o.   MRN: [id number]    Advanced Heart Failure VAD Team Note  PCP-Cardiologist: None   Subjective:    12/19 RHC- RA 7, PA 39/14 (25), PCWP 11, CO 6.4 CI 3. Thermo 3.5 1.8. Lasix drip stopped after cath. 12/20 Swan removed.  Norepi 3 mcg added.  12/21 Fever. Blood CX drawn. UA + leukocytes. Started on vanc and cefepime. Diuresed with IV lasix + metolazone.  12/23 Impella 5.5 placed 12/28 HM III LVAD, ICD leads plastered to tricuspid valve with severe TR, valve not replaced and some improvement in TR with LVAD placement.  12/29 Extubated 01/03 Started CRRT 01/07  L thoracentesis and placement of [facility] 01/09 LVAD speed increased to 5600 rpm 01/10 Milrinone stopped. Amio gtt>>PO. Completed 5 day course Cefepime.  01/12 Ramp echo done: Moderate lateral pericardial effusion w/o tamponade, RV larger with severe dilation/moderate dysfunction and left shift of the septum, LV small and undershifted.  TR severe.  LVAD speed decreased to 5000 rpms at 2 different sessions.  Also had thoracentesis on left with 1.3 L off.  01/13 TFs begun  He remains on milrinone 0.25 and NE 3, MAP 70s.  CVVH ongoing, have been trying to pull 100 cc/hr.  Got fluid off overnight but not much during dayshift.  Still with CVP 14-15.    He appears to be in NSR, now on po amiodarone.   LDH 295>314>313>300>285>270 INR 1.4  Hgb 8.6 => 8.3 => 7.8  Now getting tube feeds but also eating better.   Walked in hall yesterday, mobility improving.   LVAD INTERROGATION:  HeartMate III LVAD:   Flow 4.2 liters/min, speed 5000, power 3.4 PI 2.5.  No PI events.  VAD interrogated personally.      Objective:    Vital Signs:   Temp:  [97.6 F (36.4 C)-98.8 F (37.1 C)] 97.7 F (36.5 C) (01/16 0400) Pulse Rate:  [30-138] 78 (01/16 0600) Resp:  [14-24] 15 (01/16 0600) BP: (68-108)/(50-83) 108/52 (01/16 0600) SpO2:  [92 %-98 %] 96 % (01/16 0600) Last BM Date:  10/05/21 Mean arterial Pressure  70s  Intake/Output:   Intake/Output Summary (Last 24 hours) at 10/06/2021 0724 Last data filed at 10/06/2021 0600 Gross per 24 hour  Intake 1076.58 ml  Output 1989 ml  Net -912.42 ml     Physical Exam   CVP 14-15 General: Well appearing this am. NAD.  HEENT: Normal. Neck: Supple, JVP 14 cm. Carotids OK.  Cardiac:  Mechanical heart sounds with LVAD hum present.  Lungs:  CTAB, normal effort.  Abdomen:  NT, ND, no HSM. No bruits or masses. +BS  LVAD exit site: Well-healed and incorporated. Dressing dry and intact. No erythema or drainage. Stabilization device present and accurately applied. Driveline dressing changed daily per sterile technique. Extremities:  Warm and dry. No cyanosis, clubbing, rash. Trace ankle edema.  Neuro:  Alert & oriented x 3. Cranial nerves grossly intact. Moves all 4 extremities w/o difficulty. Affect pleasant    Telemetry   Appears to be NSR 70s with occasional a and v pacing (personally reviewed)   Labs   Basic Metabolic Panel: Recent Labs  Lab 10/03/21 1620 10/04/21 0413 10/04/21 1558 10/05/21 0408 10/05/21 1620 10/06/21 0258  NA 136 137 133* 135 133* 131*  K 4.0 4.4 4.9 5.0 5.2* 4.9  CL 102 100 102 100 98 97*  CO2 26 28 27 27 27 27   GLUCOSE 149* 117* 141* 109* 145* 203*  BUN 17 15 18 19 21 22   CREATININE 1.39* 1.34* 1.42* 1.38* 1.42* 1.51*  CALCIUM 7.6* 8.4* 7.8* 8.2* 8.0* 7.9*  MG 2.2 2.5* 2.4 2.3  --  2.4  PHOS 2.3* 1.9*   1.9* 1.7*   1.7* 1.7*   1.7* 1.3* 1.3*    Liver Function Tests: Recent Labs  Lab 10/04/21 0413 10/04/21 1558 10/05/21 0408 10/05/21 1620 10/06/21 0258  ALBUMIN 2.0* 2.0* 2.0* 2.0* 1.9*   No results for input(s): LIPASE, AMYLASE in the last 168 hours. No results for input(s): AMMONIA in the last 168 hours.   CBC: Recent Labs  Lab 10/02/21 0415 10/02/21 1400 10/03/21 0406 10/04/21 0413 10/05/21 0408 10/06/21 0258  WBC 13.2*  --  16.2* 14.5* 13.0* 10.9*  HGB 7.7*  8.2* 7.2* 8.6* 8.3* 7.8*  HCT 24.2* 25.5* 22.4* 27.3* 26.5* 25.7*  MCV 90.3  --  89.2 91.0 93.6 95.2  PLT 576*  --  534* 488* 434* 404*    INR: Recent Labs  Lab 10/02/21 0415 10/03/21 0406 10/04/21 0413 10/05/21 0408 10/06/21 0258  INR 2.2* 1.9* 1.7* 1.6* 1.4*    Other results:   Imaging   DG CHEST PORT 1 VIEW  Result Date: 10/04/2021 CLINICAL DATA:  Shortness of breath EXAM: PORTABLE CHEST 1 VIEW COMPARISON:  Previous studies including the examination of 10/03/2021 FINDINGS: Transverse diameter of heart is increased. Left ventricular assist device is noted in place. Pacemaker/defibrillator battery is seen in the left infraclavicular region. Enteric tube is noted with its distal portion in the stomach. There is large caliber central venous catheter with its tip at the junction of superior vena cava and right atrium. Distal course of right PICC line is seen in the region of superior vena cava. There is interval decrease in pulmonary vascular congestion. There is interval improvement in aeration of mid and lower lung fields suggesting resolving pulmonary edema. There is increased density in the left lower lung fields which may be due to pleural effusion and possibly underlying atelectasis/pneumonia. There is no pneumothorax. IMPRESSION: Cardiomegaly. There is interval decrease in pulmonary vascular congestion and resolution of pulmonary edema. Increased density in the left lower lung fields may be due to pleural effusion and possibly underlying infiltrate. Electronically Signed   By: Elmer Picker M.D.   On: 10/04/2021 08:53     Medications:     Scheduled Medications:  sodium chloride   Intravenous Once   sodium chloride   Intravenous Once   sodium chloride   Intravenous Once   sodium chloride   Intravenous Once   sodium chloride   Intravenous Once   sodium chloride   Intravenous Once   acetaminophen  500 mg Oral Q6H   amiodarone  200 mg Oral BID   aspirin  81 mg Oral Daily    Chlorhexidine Gluconate Cloth  6 each Topical Daily   darbepoetin (ARANESP) injection - NON-DIALYSIS  60 mcg Subcutaneous Q Mon-1800   docusate  200 mg Per Tube Daily   feeding supplement  237 mL Oral TID BM   mouth rinse  15 mL Mouth Rinse BID   metoCLOPramide (REGLAN) injection  5 mg Intravenous Q8H   midodrine  15 mg Oral TID WC   montelukast  5 mg Oral QHS   multivitamin  1 tablet Oral QHS   pantoprazole  40 mg Oral Daily   polyethylene glycol  17 g Oral Daily   rosuvastatin  10 mg Oral Daily   sodium chloride flush  10-40 mL Intracatheter  Q12H   sodium chloride flush  3 mL Intravenous Q12H   Warfarin - Pharmacist Dosing Inpatient   Does not apply q1600    Infusions:   prismasol BGK 4/2.5 400 mL/hr at 10/05/21 1158    prismasol BGK 4/2.5 200 mL/hr at 10/05/21 1158   sodium chloride Stopped (09/20/21 0304)   sodium chloride Stopped (09/18/21 0803)   sodium chloride Stopped (10/02/21 0900)   epinephrine Stopped (10/01/21 1017)   feeding supplement (PIVOT 1.5 CAL) 50 mL/hr at 10/05/21 2000   lactated ringers     milrinone 0.25 mcg/kg/min (10/06/21 0600)   norepinephrine (LEVOPHED) Adult infusion 3 mcg/min (10/06/21 0600)   prismasol BGK 4/2.5 1,800 mL/hr at 10/05/21 1535   promethazine (PHENERGAN) injection (IM or IVPB) Stopped (10/01/21 1714)    PRN Medications: sodium chloride, sodium chloride, albuterol, heparin, heparin, lip balm, morphine injection, ondansetron (ZOFRAN) IV, oxyCODONE, polyethylene glycol, promethazine (PHENERGAN) injection (IM or IVPB), sodium chloride flush, sodium chloride flush, traMADol   Assessment/Plan:    1. Acute on chronic systolic CHF:  Long-standing cardiomyopathy.  [place].  Echo this admission with EF < 20%, severe LV dilation, restrictive diastolic function, moderate RV dysfunction, moderate MR, mod-severe TR. Cause of cardiomyopathy is uncertain.  He has a history of CAD, but I do not think that the described CAD from the  past could explain his cardiomyopathy, but CAD could have progressed.  With difficulty tolerating GDMT/need for midodrine and cardiorenal syndrome as well as profound volume overload + NYHA class IV symptoms,  concerned for low output HF. Co-ox off milrinone was 36%, milrinone started and increased to 0.375 but CO remained low. NE added and Impella 5.5 placed 12/23. EF 10% on TEE 12/23. s/p HM III VAD on 12/28.  Ramp echo 01/03 with EF 20-25%, RV moderately reduced, septum mid-line.  Ramp echo again on 1/12 showed severely dilated/moderately dysfunctional RV with severe TR and left-shifted septum with small LV.  Speed decreased on 1/12 to 5000 rpm over 2 sessions due to RV failure and milrinone 0.25 + NE 3 restarted.  Able to increase CVVH, weight down significantly.  MAP 70s today, CVP 14-15 with co-ox 61%.  No PI events on device interrogation (improved). Walked around unit yesterday.  Getting fluid off should help his RV. Stable LDH.  - Continue milrinone 0.25 mcg/kg/min, may need to go home on this for RV support.  - Wean NE as able but would like to get more fluid off via CVVH.  Pull net negative 100-150 cc/hr today. Discussed with nursing.  - Continue midodrine 15 mg tid.   - He does not have BP room for sildenafil.  - Warfarin for INR 2-2.5.  INR 1.4 today, adjust warfarin and will add back low dose heparin for now.   - Continue 81 mg aspirin - Continue UNNA boots 2. Tricuspid regurgitation:  Tricuspid repair not done at time of VAD d/t proximity of ICD wires and hypotension during surgery.  He has severe TR.  3. Atrial fibrillation: Persistent since 10/22 based on device interrogation.  Was in junctional rhythm, now probably NSR. - Continue PO amio 200 bid  4. AKI on CKD stage 3: Gradual rise (was about 1.7 pre-op) since surgery.  He has not been hypotensive post-op, suspect intra-op hypotension led to development of ATN => urine sediment looked like ATN per renal. Now on CVVH. TDC placed 1/7.  -  Maintain MAP and CO as above - Aim for net negative 100-150 cc/hr UF.  -  Nephrology following, suspect he will need iHD eventually.    5. CAD: History of PCI to OM1 in 2007 and RCA in 2013.  No CP or ACs.  - Deferred cardiac cath due to AKI and plan for VAD - Continue Crestor.  6. ID: Had fever 12/21, PCT 1.03.  Blood cultures negative. ?Phlebitis at IV site. Post-op fever with elevated WBCs, PCT 2.63 -> 1.78.  He completed course of cefepime. Afebrile with stable WBCs.   7. Constipation: Continue aggressive bowel regimen. improved 8. Hyponatremia: Hypervolemic hyponatremia.  Controlled via CVVH.  9. Anemia: 1 unit PRBCs on 1/12,  1 unit PRBs 1/13. No overt bleeding, ?losses via CVVH.  Hgb 7.8 today.  - Transfuse 1 unit.  10. Confusion/delirium: Initially post-op and started on Precedex.  This has cleared, now up and has been walking halls 11. F/E/N: TFs via [place] for poor nutritional status.  Improving po intake.  - Continue TFs today.  12. Left pleural effusion: - s/p thoracentesis 1/7 - s/p thoracentesis 1/12.  Walk in hall today.   I reviewed the LVAD parameters from today, and compared the results to the patient's prior recorded data.  No programming changes were made.  The LVAD is functioning within specified parameters.  The patient performs LVAD self-test daily.  LVAD interrogation was negative for any significant power changes, alarms or PI events/speed drops.  LVAD equipment check completed and is in good working order.  Back-up equipment present.   LVAD education done on emergency procedures and precautions and reviewed exit site care.  CRITICAL CARE Performed by: Loralie Champagne  Total critical care time: 40 minutes  Critical care time was exclusive of separately billable procedures and treating other patients.  Critical care was necessary to treat or prevent imminent or life-threatening deterioration.  Critical care was time spent personally by me on the following  activities: development of treatment plan with patient and/or surrogate as well as nursing, discussions with consultants, evaluation of patient's response to treatment, examination of patient, obtaining history from patient or surrogate, ordering and performing treatments and interventions, ordering and review of laboratory studies, ordering and review of radiographic studies, pulse oximetry and re-evaluation of patient's condition.  Loralie Champagne 10/06/2021 7:24 AM

## 2021-10-07 DIAGNOSIS — Z95811 Presence of heart assist device: Secondary | ICD-10-CM | POA: Diagnosis not present

## 2021-10-07 DIAGNOSIS — L8990 Pressure ulcer of unspecified site, unspecified stage: Secondary | ICD-10-CM | POA: Insufficient documentation

## 2021-10-07 DIAGNOSIS — R570 Cardiogenic shock: Secondary | ICD-10-CM | POA: Diagnosis not present

## 2021-10-07 LAB — CBC
HCT: 26.5 % — ABNORMAL LOW (ref 39.0–52.0)
Hemoglobin: 8.5 g/dL — ABNORMAL LOW (ref 13.0–17.0)
MCH: 29.5 pg (ref 26.0–34.0)
MCHC: 32.1 g/dL (ref 30.0–36.0)
MCV: 92 fL (ref 80.0–100.0)
Platelets: 375 10*3/uL (ref 150–400)
RBC: 2.88 MIL/uL — ABNORMAL LOW (ref 4.22–5.81)
RDW: 21.2 % — ABNORMAL HIGH (ref 11.5–15.5)
WBC: 11.1 10*3/uL — ABNORMAL HIGH (ref 4.0–10.5)
nRBC: 0 % (ref 0.0–0.2)

## 2021-10-07 LAB — BPAM RBC
Blood Product Expiration Date: 202301232359
ISSUE DATE / TIME: 202301160917
Unit Type and Rh: 5100

## 2021-10-07 LAB — GLUCOSE, CAPILLARY
Glucose-Capillary: 100 mg/dL — ABNORMAL HIGH (ref 70–99)
Glucose-Capillary: 105 mg/dL — ABNORMAL HIGH (ref 70–99)
Glucose-Capillary: 111 mg/dL — ABNORMAL HIGH (ref 70–99)
Glucose-Capillary: 115 mg/dL — ABNORMAL HIGH (ref 70–99)
Glucose-Capillary: 118 mg/dL — ABNORMAL HIGH (ref 70–99)

## 2021-10-07 LAB — PROTIME-INR
INR: 1.4 — ABNORMAL HIGH (ref 0.8–1.2)
Prothrombin Time: 17 seconds — ABNORMAL HIGH (ref 11.4–15.2)

## 2021-10-07 LAB — COOXEMETRY PANEL
Carboxyhemoglobin: 2.1 % — ABNORMAL HIGH (ref 0.5–1.5)
Methemoglobin: 0.9 % (ref 0.0–1.5)
O2 Saturation: 54.5 %
Total hemoglobin: 8.7 g/dL — ABNORMAL LOW (ref 12.0–16.0)

## 2021-10-07 LAB — RENAL FUNCTION PANEL
Albumin: 2 g/dL — ABNORMAL LOW (ref 3.5–5.0)
Albumin: 1.9 g/dL — ABNORMAL LOW (ref 3.5–5.0)
Anion gap: 8 (ref 5–15)
Anion gap: 8 (ref 5–15)
BUN: 27 mg/dL — ABNORMAL HIGH (ref 8–23)
BUN: 27 mg/dL — ABNORMAL HIGH (ref 8–23)
CO2: 26 mmol/L (ref 22–32)
CO2: 27 mmol/L (ref 22–32)
Calcium: 8.1 mg/dL — ABNORMAL LOW (ref 8.9–10.3)
Calcium: 7.9 mg/dL — ABNORMAL LOW (ref 8.9–10.3)
Chloride: 101 mmol/L (ref 98–111)
Chloride: 97 mmol/L — ABNORMAL LOW (ref 98–111)
Creatinine, Ser: 1.57 mg/dL — ABNORMAL HIGH (ref 0.61–1.24)
Creatinine, Ser: 1.51 mg/dL — ABNORMAL HIGH (ref 0.61–1.24)
GFR, Estimated: 47 mL/min — ABNORMAL LOW (ref >60)
GFR, Estimated: 50 mL/min — ABNORMAL LOW (ref >60)
Glucose, Bld: 116 mg/dL — ABNORMAL HIGH (ref 70–99)
Glucose, Bld: 171 mg/dL — ABNORMAL HIGH (ref 70–99)
Phosphorus: 1.9 mg/dL — ABNORMAL LOW (ref 2.5–4.6)
Phosphorus: 3.4 mg/dL (ref 2.5–4.6)
Potassium: 4.8 mmol/L (ref 3.5–5.1)
Potassium: 4.7 mmol/L (ref 3.5–5.1)
Sodium: 135 mmol/L (ref 135–145)
Sodium: 132 mmol/L — ABNORMAL LOW (ref 135–145)

## 2021-10-07 LAB — TYPE AND SCREEN
ABO/RH(D): O POS
Antibody Screen: NEGATIVE
Unit division: 0

## 2021-10-07 LAB — LACTATE DEHYDROGENASE: LDH: 277 U/L — ABNORMAL HIGH (ref 98–192)

## 2021-10-07 LAB — MAGNESIUM: Magnesium: 2.2 mg/dL (ref 1.7–2.4)

## 2021-10-07 LAB — HEPARIN LEVEL (UNFRACTIONATED): Heparin Unfractionated: <0.1 IU/mL — ABNORMAL LOW (ref 0.30–0.70)

## 2021-10-07 MED ORDER — MIDODRINE HCL 5 MG PO TABS: 20.0000 mg | ORAL_TABLET | Freq: Three times a day (TID) | ORAL | Status: DC

## 2021-10-07 MED ORDER — RENA-VITE PO TABS: 1.0000 | ORAL_TABLET | Freq: Every day | ORAL | Status: DC

## 2021-10-07 MED ORDER — ACETAMINOPHEN 160 MG/5ML PO SOLN
500.0000 mg | Freq: Four times a day (QID) | ORAL | Status: DC
  Administered 2021-10-07 – 2021-10-08 (×2): 500 mg
  Filled 2021-10-07 (×2): qty 20.3

## 2021-10-07 MED ORDER — PANTOPRAZOLE 2 MG/ML SUSPENSION: 40.0000 mg | Freq: Every day | ORAL | Status: DC

## 2021-10-07 MED ORDER — POLYETHYLENE GLYCOL 3350 17 G PO PACK: 17.0000 g | PACK | Freq: Every day | ORAL | Status: DC | PRN

## 2021-10-07 MED ORDER — SODIUM PHOSPHATES 45 MMOLE/15ML IV SOLN
15.0000 mmol | Freq: Once | INTRAVENOUS | Status: AC
  Administered 2021-10-07: 15 mmol via INTRAVENOUS
  Filled 2021-10-07: qty 5

## 2021-10-07 MED ORDER — MIDODRINE HCL 5 MG PO TABS
5.0000 mg | ORAL_TABLET | Freq: Once | ORAL | Status: AC
  Administered 2021-10-07: 5 mg via ORAL
  Filled 2021-10-07: qty 1

## 2021-10-07 MED ORDER — TRAMADOL HCL 50 MG PO TABS: 50.0000 mg | ORAL_TABLET | ORAL | Status: DC | PRN

## 2021-10-07 MED ORDER — ASPIRIN 81 MG PO CHEW: 81.0000 mg | CHEWABLE_TABLET | Freq: Every day | ORAL | Status: DC

## 2021-10-07 MED ORDER — AMIODARONE HCL 200 MG PO TABS: 200.0000 mg | ORAL_TABLET | Freq: Two times a day (BID) | ORAL | Status: DC

## 2021-10-07 MED ORDER — MONTELUKAST SODIUM 10 MG PO TABS: 5.0000 mg | ORAL_TABLET | Freq: Every day | ORAL | Status: DC

## 2021-10-07 MED ORDER — WARFARIN SODIUM 2 MG PO TABS
4.0000 mg | ORAL_TABLET | Freq: Once | ORAL | Status: AC
  Administered 2021-10-07: 4 mg

## 2021-10-07 MED ORDER — OXYCODONE HCL 5 MG PO TABS: 5.0000 mg | ORAL_TABLET | ORAL | Status: DC | PRN

## 2021-10-07 MED ORDER — WARFARIN SODIUM 2 MG PO TABS
4.0000 mg | ORAL_TABLET | Freq: Once | ORAL | Status: DC
  Filled 2021-10-07: qty 2

## 2021-10-07 MED ORDER — ROSUVASTATIN CALCIUM 5 MG PO TABS: 10.0000 mg | ORAL_TABLET | Freq: Every day | ORAL | Status: DC

## 2021-10-07 MED ORDER — POLYETHYLENE GLYCOL 3350 17 G PO PACK
17.0000 g | PACK | Freq: Every day | ORAL | Status: DC
  Administered 2021-10-08: 17 g
  Filled 2021-10-07: qty 1

## 2021-10-07 NOTE — Progress Notes (Signed)
LVAD Coordinator Rounding Note:  Admitted 09/06/21 due to CHF exacerbation to Dr Claris Gladden service. He was started on Milrinone on admission. Febrile n 09/10/21- suspected to have UTI- antibiotics started. Impella 5.5 placed 09/12/21.   HM III LVAD implanted on 09/17/21 by Dr Cyndia Bent under destination therapy criteria. 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   Pt lying in bed resting. Reports he has not been up out of bed yet. Denies pain. VAD speed 5000 per RAMP echo last week- tolerating well.   CRRT pulling 100-150 cc/hr based on BP. Continues of Levophed drip. CVP 14-15 this morning.   Tolerating tube feeds via core-track. Appetite is improving.   Vital signs: Temp: 98.5 HR: 65 AV paced Doppler Pressure: 82 Auto Cuff BP: 73/57 (65) O2 Sat: 94% on 3L [place] Wt: 185.2>196.8>194>189.1>192.2>198.4>189.6>188.7>183.6>177>160.2>157.6 lbs    LVAD interrogation reveals:  Speed: 5000 Flow: 4.1 Power: 3.5 w PI: 3.0 Hematocrit: 26  Alarms: none Events: none  Fixed speed: 5000 Low speed limit: 4700  Drive Line:  CDI. Drive line anchor secure. Will continue M/W/F dressing changes per Nurse Davonna Belling, [place] coordinator, or trained caregiver.  Next dressing change due: 10/08/21.    Labs:  LDH trend: 344>379>370>378>380>331>296>281>291>293>295>270>277  INR trend: 1.6>1.8>2.8>2.9>2.1>2.0>2.2>2.4>2.4>2.5>2.3>1.4  WBC trend: 14.1>13.6>13.1>11>8.7>10.1>13.5>14>14.2>14.3>13.2>10.9>11.1  Cr trend: 2.11>2.39>5.24>3.45>2.38>1.97>1.87>1.68>1.73>1.60>1.49>1.52>1.51>1.57  Hgb trend: 6.5>7.5>8.1>8.8>7.7>8.6>7.7>7.8>8.5  Anticoagulation Plan: -INR Goal: 2.0 - 2.5 -ASA Dose: 81 mg daily   Blood Products:  Intra Op: 09/17/21  1 PLT  4 FFP   4 PRBC  Cellsaver- 803 cc Post Op:  - 09/17/21>>1 PRBC - 09/18/21>> 3 PRBCs - 09/19/21>> 1 PRBC -  09/26/21>> 1 PRBC - 10/02/21>>1 PRBC - 10/06/21 >> 1 PRBC  Device: -  [employer] dual -Therapies: OFF  *Will need to be referred to device clinic at discharge*  Arrythmias: 3 episodes of VT that required defib while in OR 09/17/21. Currently on Amiodarone gtt. Remains in afib.   Respiratory: extubated 09/18/22  Nitric Oxide:  off 09/18/22  Renal:  09/23/21>>started CVVH  Infection:  09/10/21>> blood cultures >> No growth 5 days; final 09/12/21>> urine culture >> No growth; final  09/25/21>>blood cultures>>negative  09/27/21>>pleural effusion>>negative, final 10/02/21>> pleural effusion >> no growth; final   Drips:  Milrinone 0.25 mcg/kg/min (restarted 10/02/21) Levo 3 mcg/min Tube feed 65 cc/hr Heparin 500 units/hr  Patient Education: No family at bedside. Patient tired and in bed. Not appropriate for teaching at this time  Plan/Recommendations:  1. [place] VAD coordinator for drive line or equipment concerns 2. Daily drive line dressing change per Nurse Davonna Belling or [place] coordinator  [name] RN [place] Coordinator  Office: [phone]  24/7 Pager: [phone]

## 2021-10-07 NOTE — Progress Notes (Addendum)
Patient ID: Isaac [name], male   DOB: 26-Dec-1950, 71 y.o.   MRN: [id number]    Advanced Heart Failure VAD Team Note  PCP-Cardiologist: None   Subjective:    12/19 RHC- RA 7, PA 39/14 (25), PCWP 11, CO 6.4 CI 3. Thermo 3.5 1.8. Lasix drip stopped after cath. 12/20 Swan removed.  Norepi 3 mcg added.  12/21 Fever. Blood CX drawn. UA + leukocytes. Started on vanc and cefepime. Diuresed with IV lasix + metolazone.  12/23 Impella 5.5 placed 12/28 HM III LVAD, ICD leads plastered to tricuspid valve with severe TR, valve not replaced and some improvement in TR with LVAD placement.  12/29 Extubated 01/03 Started CRRT 01/07  L thoracentesis and placement of [facility] 01/09 LVAD speed increased to 5600 rpm 01/10 Milrinone stopped. Amio gtt>>PO. Completed 5 day course Cefepime.  01/12 Ramp echo done: Moderate lateral pericardial effusion w/o tamponade, RV larger with severe dilation/moderate dysfunction and left shift of the septum, LV small and undershifted.  TR severe.  LVAD speed decreased to 5000 rpms at 2 different sessions.  Also had thoracentesis on left with 1.3 L off.  01/13 TFs begun 01/16 echo reviewed: EF 25-30%, LV larger than prior but septum still with left shift, RV moderately dilated and moderately dysfunctional, moderate-severe TR, mod-large localized pericardial effusion lateral to the LV.   He remains on milrinone 0.25 and NE 3, MAP 70s-80s.  CVVH ongoing, have been trying to pull 100 cc/hr.  Weight down 3 lbs with CVP 12. Co-ox 55%.    He appears to be in NSR, now on po amiodarone.   LDH 295>314>313>300>285>270>277 INR 1.4, on heparin gtt  Hgb 8.6 => 8.3 => 7.8 => 8.7 off co-ox  Now getting tube feeds but also eating meals.   Walked in hall yesterday, mobility improving.   LVAD INTERROGATION:  HeartMate III LVAD:   Flow 4.3 liters/min, speed 5000, power 3.4 PI 2.6.  No PI events.  VAD interrogated personally.      Objective:    Vital Signs:   Temp:  [97.2 F (36.2  C)-99.2 F (37.3 C)] 98.4 F (36.9 C) (01/17 0700) Pulse Rate:  [31-179] 65 (01/17 0800) Resp:  [15-29] 16 (01/17 0800) BP: (65-130)/(43-118) 73/57 (01/17 0800) SpO2:  [85 %-99 %] 94 % (01/17 0800) Weight:  [71.5 kg] 71.5 kg (01/17 0500) Last BM Date: 10/06/21 Mean arterial Pressure  70s  Intake/Output:   Intake/Output Summary (Last 24 hours) at 10/07/2021 0831 Last data filed at 10/07/2021 0800 Gross per 24 hour  Intake 3232.1 ml  Output 5746 ml  Net -2513.9 ml     Physical Exam   CVP 12 General: Well appearing this am. NAD.  HEENT: Normal. Neck: Supple, JVP 12 cm. Carotids OK.  Cardiac:  Mechanical heart sounds with LVAD hum present.  Lungs:  CTAB, normal effort.  Abdomen:  NT, ND, no HSM. No bruits or masses. +BS  LVAD exit site: Well-healed and incorporated. Dressing dry and intact. No erythema or drainage. Stabilization device present and accurately applied. Driveline dressing changed daily per sterile technique. Extremities:  Warm and dry. No cyanosis, clubbing, rash, or edema.  Neuro:  Alert & oriented x 3. Cranial nerves grossly intact. Moves all 4 extremities w/o difficulty. Affect pleasant     Telemetry   Appears to be NSR 60s with occasional a and v pacing (personally reviewed)   Labs   Basic Metabolic Panel: Recent Labs  Lab 10/04/21 0413 10/04/21 1558 10/05/21 0408 10/05/21 1620 10/06/21 0258  10/06/21 1435 10/07/21 0411  NA 137 133* 135 133* 131* 135 135  K 4.4 4.9 5.0 5.2* 4.9 5.2* 4.8  CL 100 102 100 98 97* 102 101  CO2 28 27 27 27 27 26 26   GLUCOSE 117* 141* 109* 145* 203* 120* 116*  BUN 15 18 19 21 22  25* 27*  CREATININE 1.34* 1.42* 1.38* 1.42* 1.51* 1.47* 1.57*  CALCIUM 8.4* 7.8* 8.2* 8.0* 7.9* 8.1* 8.1*  MG 2.5* 2.4 2.3  --  2.4  --  2.2  PHOS 1.9*   1.9* 1.7*   1.7* 1.7*   1.7* 1.3* 1.3* 1.4* 1.9*    Liver Function Tests: Recent Labs  Lab 10/05/21 0408 10/05/21 1620 10/06/21 0258 10/06/21 1435 10/07/21 0411  ALBUMIN 2.0* 2.0*  1.9* 2.1* 2.0*   No results for input(s): LIPASE, AMYLASE in the last 168 hours. No results for input(s): AMMONIA in the last 168 hours.   CBC: Recent Labs  Lab 10/02/21 0415 10/02/21 1400 10/03/21 0406 10/04/21 0413 10/05/21 0408 10/06/21 0258  WBC 13.2*  --  16.2* 14.5* 13.0* 10.9*  HGB 7.7* 8.2* 7.2* 8.6* 8.3* 7.8*  HCT 24.2* 25.5* 22.4* 27.3* 26.5* 25.7*  MCV 90.3  --  89.2 91.0 93.6 95.2  PLT 576*  --  534* 488* 434* 404*    INR: Recent Labs  Lab 10/03/21 0406 10/04/21 0413 10/05/21 0408 10/06/21 0258 10/07/21 0411  INR 1.9* 1.7* 1.6* 1.4* 1.4*    Other results:   Imaging   ECHOCARDIOGRAM COMPLETE  Result Date: 10/06/2021    ECHOCARDIOGRAM REPORT   Patient Name:   Isaac [name] Date of Exam: 10/06/2021 Medical Rec #:  [id number]           Height:       69.0 in Accession #:    [id number]          Weight:       160.3 lb Date of Birth:  1950/09/29            BSA:          1.880 m Patient Age:    21 years            BP:           83/68 mmHg Patient Gender: M                   HR:           60 bpm. Exam Location:  Inpatient Procedure: 2D Echo Indications:    Assess LVAD  History:        Patient has prior history of Echocardiogram examinations, most                 recent 10/02/2021. CHF, CAD; Arrythmias:Atrial Fibrillation.  Sonographer:    Jefferey Pica Referring Phys: [place]  1. LVAD inflow cannula visualized at the apex (image 12)- no obvious thrombus. No device adjustments were noted. Left ventricular ejection fraction, by estimation, is 25 to 30%. The left ventricle has severely decreased function. The left ventricle has no regional wall motion abnormalities. There is mild left ventricular hypertrophy. Left ventricular diastolic function could not be evaluated.  2. Right ventricular systolic function is moderately reduced. The right ventricular size is mildly enlarged. There is mildly elevated pulmonary artery systolic pressure. The  estimated right ventricular systolic pressure is 01.0 mmHg.  3. Large pericardial effusion. The pericardial effusion is circumferential (lateral, LV posterior, apical).  4. The mitral  valve is normal in structure. No evidence of mitral valve regurgitation.  5. Tricuspid valve regurgitation is moderate to severe.  6. The aortic valve does not open. The aortic valve is abnormal. Aortic valve regurgitation is trivial.  7. Aortic dilatation noted. There is mild dilatation of the ascending aorta, measuring 40 mm.  8. The inferior vena cava is dilated in size with <50% respiratory variability, suggesting right atrial pressure of 15 mmHg. Comparison(s): Changes from prior study are noted. 10/02/2021: LVEF 25-30%, moderate-sized lateral pericardial effusion. Conclusion(s)/Recommendation(s): Pericardial effusion appears similar to prior study on 10/02/21, but is larger than reported. Unable to assess for tamponade physiology in the presence of LVAD - clinical correlation is advised. FINDINGS  Left Ventricle: LVAD inflow cannula visualized at the apex (image 12)- no obvious thrombus. No device adjustments were noted. Left ventricular ejection fraction, by estimation, is 25 to 30%. The left ventricle has severely decreased function. The left ventricle has no regional wall motion abnormalities. The left ventricular internal cavity size was normal in size. There is mild left ventricular hypertrophy. Left ventricular diastolic function could not be evaluated due to nondiagnostic images. Left ventricular diastolic function could not be evaluated. Right Ventricle: The right ventricular size is mildly enlarged. No increase in right ventricular wall thickness. Right ventricular systolic function is moderately reduced. There is mildly elevated pulmonary artery systolic pressure. The tricuspid regurgitant velocity is 2.53 m/s, and with an assumed right atrial pressure of 15 mmHg, the estimated right ventricular systolic pressure is 24.2  mmHg. Left Atrium: Left atrial size was normal in size. Right Atrium: Right atrial size was normal in size. Pericardium: A large pericardial effusion is present. The pericardial effusion is circumferential. Mitral Valve: The mitral valve is normal in structure. No evidence of mitral valve regurgitation. Tricuspid Valve: The tricuspid valve is not well visualized. Tricuspid valve regurgitation is moderate to severe. Aortic Valve: The aortic valve does not open. The aortic valve is abnormal. Aortic valve regurgitation is trivial. Aortic valve peak gradient measures 18.8 mmHg. Pulmonic Valve: The pulmonic valve was not well visualized. Pulmonic valve regurgitation is not visualized. Aorta: Aortic dilatation noted. There is mild dilatation of the ascending aorta, measuring 40 mm. Venous: The inferior vena cava is dilated in size with less than 50% respiratory variability, suggesting right atrial pressure of 15 mmHg. IAS/Shunts: The interatrial septum was not well visualized.  LEFT VENTRICLE PLAX 2D LVIDd:         3.90 cm LVIDs:         3.25 cm LV PW:         1.30 cm LV IVS:        1.30 cm  RIGHT VENTRICLE            IVC RV Basal diam:  2.50 cm    IVC diam: 2.70 cm RV S prime:     5.46 cm/s TAPSE (M-mode): 1.4 cm LEFT ATRIUM             Index        RIGHT ATRIUM           Index LA diam:        3.90 cm 2.07 cm/m   RA Area:     16.90 cm LA Vol (A2C):   67.6 ml 35.95 ml/m  RA Volume:   40.10 ml  21.32 ml/m LA Vol (A4C):   41.0 ml 21.80 ml/m LA Biplane Vol: 53.0 ml 28.18 ml/m  AORTIC VALVE  PULMONIC VALVE AV Vmax:      217.00 cm/s PV Vmax:       1.02 m/s AV Peak Grad: 18.8 mmHg   PV Peak grad:  4.2 mmHg  AORTA Ao Root diam: 4.00 cm Ao Asc diam:  4.00 cm MITRAL VALVE               TRICUSPID VALVE MV Area (PHT): 3.89 cm    TR Peak grad:   25.6 mmHg MV Decel Time: 195 msec    TR Vmax:        253.00 cm/s MV E velocity: 74.10 cm/s MV A velocity: 43.30 cm/s MV E/A ratio:  1.71 Lyman Bishop MD Electronically  signed by Lyman Bishop MD Signature Date/Time: 10/06/2021/2:17:19 PM    Final      Medications:     Scheduled Medications:  sodium chloride   Intravenous Once   sodium chloride   Intravenous Once   sodium chloride   Intravenous Once   sodium chloride   Intravenous Once   sodium chloride   Intravenous Once   acetaminophen  500 mg Oral Q6H   amiodarone  200 mg Oral BID   aspirin  81 mg Oral Daily   Chlorhexidine Gluconate Cloth  6 each Topical Daily   darbepoetin (ARANESP) injection - NON-DIALYSIS  60 mcg Subcutaneous Q Mon-1800   docusate  200 mg Per Tube Daily   feeding supplement  237 mL Oral TID BM   mouth rinse  15 mL Mouth Rinse BID   metoCLOPramide (REGLAN) injection  5 mg Intravenous Q8H   midodrine  15 mg Oral TID WC   montelukast  5 mg Oral QHS   multivitamin  1 tablet Oral QHS   pantoprazole  40 mg Oral Daily   polyethylene glycol  17 g Oral Daily   rosuvastatin  10 mg Oral Daily   sodium chloride flush  10-40 mL Intracatheter Q12H   sodium chloride flush  3 mL Intravenous Q12H   Warfarin - Pharmacist Dosing Inpatient   Does not apply q1600    Infusions:   prismasol BGK 4/2.5 400 mL/hr at 10/06/21 2105    prismasol BGK 4/2.5 200 mL/hr at 10/06/21 0955   sodium chloride Stopped (09/20/21 0304)   sodium chloride Stopped (09/18/21 0803)   sodium chloride Stopped (10/02/21 0900)   epinephrine Stopped (10/01/21 1017)   feeding supplement (PIVOT 1.5 CAL) 1,000 mL (10/07/21 0606)   heparin 500 Units/hr (10/07/21 0800)   iron sucrose Stopped (10/06/21 1251)   lactated ringers     milrinone 0.25 mcg/kg/min (10/07/21 0800)   norepinephrine (LEVOPHED) Adult infusion 3 mcg/min (10/07/21 0800)   prismasol BGK 4/2.5 1,800 mL/hr at 10/07/21 0802   promethazine (PHENERGAN) injection (IM or IVPB) Stopped (10/01/21 1714)   sodium phosphate  Dextrose 5% IVPB      PRN Medications: sodium chloride, sodium chloride, albuterol, heparin, heparin, lip balm, morphine injection,  ondansetron (ZOFRAN) IV, oxyCODONE, polyethylene glycol, promethazine (PHENERGAN) injection (IM or IVPB), sodium chloride flush, sodium chloride flush, traMADol   Assessment/Plan:    1. Acute on chronic systolic CHF:  Long-standing cardiomyopathy.  [place].  Echo this admission with EF < 20%, severe LV dilation, restrictive diastolic function, moderate RV dysfunction, moderate MR, mod-severe TR. Cause of cardiomyopathy is uncertain.  He has a history of CAD, but I do not think that the described CAD from the past could explain his cardiomyopathy, but CAD could have progressed.  With difficulty tolerating GDMT/need for midodrine and  cardiorenal syndrome as well as profound volume overload + NYHA class IV symptoms,  concerned for low output HF. Co-ox off milrinone was 36%, milrinone started and increased to 0.375 but CO remained low. NE added and Impella 5.5 placed 12/23. EF 10% on TEE 12/23. s/p HM III VAD on 12/28.  Ramp echo 01/03 with EF 20-25%, RV moderately reduced, septum mid-line.  Ramp echo again on 1/12 showed severely dilated/moderately dysfunctional RV with severe TR and left-shifted septum with small LV.  Speed decreased on 1/12 to 5000 rpm over 2 sessions due to RV failure and milrinone 0.25 + NE 3 restarted.  Echo repeated on 1/16, RV looks better and LV larger but still with left shift of the septum.  Able to increase CVVH, weight down significantly.  MAP 70s today, CVP 12 with co-ox 55%.  No PI events on device interrogation (improved). Walked around unit yesterday.  Getting fluid off should help his RV. Stable LDH.  - Continue milrinone 0.25 mcg/kg/min, may need to go home on this for RV support.  - Work on weaning NE today, go by doppler blood pressure and not cuff.  Pull net negative 100 cc/hr today, still has fluid to get off but improving. Discussed with nursing.  - Continue midodrine 15 mg tid.   - He does not have BP room for sildenafil.  - Warfarin for INR 2-2.5.  INR  1.4 today, adjust warfarin and continue low dose heparin for now.   - Continue 81 mg aspirin - Continue UNNA boots 2. Tricuspid regurgitation:  Tricuspid repair not done at time of VAD d/t proximity of ICD wires and hypotension during surgery.  He has severe TR.  3. Atrial fibrillation: Persistent since 10/22 based on device interrogation.  Was in junctional rhythm, now probably NSR. - Continue PO amio 200 bid  4. AKI on CKD stage 3: Gradual rise (was about 1.7 pre-op) since surgery.  He has not been hypotensive post-op, suspect intra-op hypotension led to development of ATN => urine sediment looked like ATN per renal. Now on CVVH. TDC placed 1/7.  - Maintain MAP and CO as above - Aim for net negative 100 cc/hr UF.  - Nephrology following, suspect he will need iHD eventually.    5. CAD: History of PCI to OM1 in 2007 and RCA in 2013.  No CP or ACs.  - Deferred cardiac cath due to AKI and plan for VAD - Continue Crestor.  6. ID: Had fever 12/21, PCT 1.03.  Blood cultures negative. ?Phlebitis at IV site. Post-op fever with elevated WBCs, PCT 2.63 -> 1.78.  He completed course of cefepime. Afebrile with stable WBCs.   7. Constipation: Continue aggressive bowel regimen. improved 8. Hyponatremia: Hypervolemic hyponatremia.  Controlled via CVVH.  9. Anemia: 1 unit PRBCs on 1/12,  1 unit PRBs 1/13, 1 unit 1/16. No overt bleeding, ?losses via CVVH.  Hgb 8.7 today off co-ox.  - Needs formal CBC 10. Confusion/delirium: Initially post-op and started on Precedex.  This has cleared, now up and has been walking halls 11. F/E/N: TFs via [place] for poor nutritional status.  Improving po intake.  - Continue TFs today.  12. Left pleural effusion: - s/p thoracentesis 1/7 - s/p thoracentesis 1/12. 13. Pericardial effusion: Stable moderate-large pericardial effusion, primarily localized to along the lateral wall of the LV.  Does not appear to be affecting the RV/doubt tamponade.  Discussed after 1/12 echo with  Dr. Cyndia Bent, conservative management for now.   Walk in hall today.  I reviewed the LVAD parameters from today, and compared the results to the patient's prior recorded data.  No programming changes were made.  The LVAD is functioning within specified parameters.  The patient performs LVAD self-test daily.  LVAD interrogation was negative for any significant power changes, alarms or PI events/speed drops.  LVAD equipment check completed and is in good working order.  Back-up equipment present.   LVAD education done on emergency procedures and precautions and reviewed exit site care.  CRITICAL CARE Performed by: Loralie Champagne  Total critical care time: 40 minutes  Critical care time was exclusive of separately billable procedures and treating other patients.  Critical care was necessary to treat or prevent imminent or life-threatening deterioration.  Critical care was time spent personally by me on the following activities: development of treatment plan with patient and/or surrogate as well as nursing, discussions with consultants, evaluation of patient's response to treatment, examination of patient, obtaining history from patient or surrogate, ordering and performing treatments and interventions, ordering and review of laboratory studies, ordering and review of radiographic studies, pulse oximetry and re-evaluation of patient's condition.  Loralie Champagne 10/07/2021 8:31 AM

## 2021-10-07 NOTE — Progress Notes (Signed)
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 Acute component either from cardiorenal +/- ATN with decreased renal function from hemodynamics and CPB + hypotensive episodes + pressors.  - CRRT started on 1/3.       - back on NE and milrinone, weaning NE as tolerated     - UF as tolerated today- continue net negative 100 mL/ hr    - all 4K, no heparin intracircuit     -Cont monitoring daily I/Os + weights  -Maintain MAP>65 for optimal renal perfusion.  -Avoid nephrotoxic medications including NSAIDs, contrast. - Dose medications for a GFR <15 ml/min and also on CRRT.   Cardiogenic shock: s/p LVAD 09/17/22 (HM 3).   - TTE to assess for RV failure 10/02/21--> RV function down - VAD speed decreased 10/02/21 - on NE and milrinone  - symptomatic improvement.   CAD with h/o PCI last in 2013  Anemia- iron indices 1/16 showing iron sat 12%, ferritin 477, Aranesp 10/06/21. Giving IV iron x 1g over 5 doses.   Afib- on amio  Acute hypoxic RF: s/p thoracentesis 1.3 L 10/02/21  Dispo: ICU, ultimately will need to be able to tolerate iHD, cont CRRT for now given requiring NE still (on max midodrine already)  Subjective:    Seen ambulating in halls.  Seems to be doing well.  I/Os yesterday 3357/5882.   Objective:   BP (!) 80/65    Pulse 65    Temp 98.4 F (36.9 C) (Oral)    Resp 20    Ht 5\' 9"  (1.753 m)    Wt 71.5 kg    SpO2 95%    BMI 23.28 kg/m   Intake/Output Summary (Last 24 hours) at 10/07/2021 1059 Last data filed at 10/07/2021 1000 Gross per 24 hour  Intake 3092.57 ml  Output 5931 ml  Net -2838.43 ml    Weight change:   Physical Exam: GEN: NAD, A&Ox3 sitting in bed HEENT: wearing glasses LUNGS: normal WOB, clear ant CV: LVAD hum ABD: + bowel sounds EXT: 1+ lower extremity edema and also present in the UE, much improved, legs wrapped at tibial level ACCESS: rt IJ TC  c/d/i   Imaging: ECHOCARDIOGRAM COMPLETE  Result Date: 10/06/2021    ECHOCARDIOGRAM REPORT   Patient Name:   Isaac [name] Date of Exam: 10/06/2021 Medical Rec #:  [id number]           Height:       69.0 in Accession #:    [id number]          Weight:       160.3 lb Date of Birth:  [birth year]            BSA:          1.880 m Patient Age:    71 years            BP:           83/68 mmHg Patient Gender: M                   HR:           60 bpm. Exam Location:  Inpatient Procedure: 2D Echo Indications:    Assess LVAD  History:        Patient has prior history of Echocardiogram examinations, most  recent 10/02/2021. CHF, CAD; Arrythmias:Atrial Fibrillation.  Sonographer:    Jefferey Pica Referring Phys: [place]  1. LVAD inflow cannula visualized at the apex (image 12)- no obvious thrombus. No device adjustments were noted. Left ventricular ejection fraction, by estimation, is 25 to 30%. The left ventricle has severely decreased function. The left ventricle has no regional wall motion abnormalities. There is mild left ventricular hypertrophy. Left ventricular diastolic function could not be evaluated.  2. Right ventricular systolic function is moderately reduced. The right ventricular size is mildly enlarged. There is mildly elevated pulmonary artery systolic pressure. The estimated right ventricular systolic pressure is 01.7 mmHg.  3. Large pericardial effusion. The pericardial effusion is circumferential (lateral, LV posterior, apical).  4. The mitral valve is normal in structure. No evidence of mitral valve regurgitation.  5. Tricuspid valve regurgitation is moderate to severe.  6. The aortic valve does not open. The aortic valve is abnormal. Aortic valve regurgitation is trivial.  7. Aortic dilatation noted. There is mild dilatation of the ascending aorta, measuring 40 mm.  8. The inferior vena cava is dilated in size with <50% respiratory variability, suggesting right  atrial pressure of 15 mmHg. Comparison(s): Changes from prior study are noted. 10/02/2021: LVEF 25-30%, moderate-sized lateral pericardial effusion. Conclusion(s)/Recommendation(s): Pericardial effusion appears similar to prior study on 10/02/21, but is larger than reported. Unable to assess for tamponade physiology in the presence of LVAD - clinical correlation is advised. FINDINGS  Left Ventricle: LVAD inflow cannula visualized at the apex (image 12)- no obvious thrombus. No device adjustments were noted. Left ventricular ejection fraction, by estimation, is 25 to 30%. The left ventricle has severely decreased function. The left ventricle has no regional wall motion abnormalities. The left ventricular internal cavity size was normal in size. There is mild left ventricular hypertrophy. Left ventricular diastolic function could not be evaluated due to nondiagnostic images. Left ventricular diastolic function could not be evaluated. Right Ventricle: The right ventricular size is mildly enlarged. No increase in right ventricular wall thickness. Right ventricular systolic function is moderately reduced. There is mildly elevated pulmonary artery systolic pressure. The tricuspid regurgitant velocity is 2.53 m/s, and with an assumed right atrial pressure of 15 mmHg, the estimated right ventricular systolic pressure is 49.4 mmHg. Left Atrium: Left atrial size was normal in size. Right Atrium: Right atrial size was normal in size. Pericardium: A large pericardial effusion is present. The pericardial effusion is circumferential. Mitral Valve: The mitral valve is normal in structure. No evidence of mitral valve regurgitation. Tricuspid Valve: The tricuspid valve is not well visualized. Tricuspid valve regurgitation is moderate to severe. Aortic Valve: The aortic valve does not open. The aortic valve is abnormal. Aortic valve regurgitation is trivial. Aortic valve peak gradient measures 18.8 mmHg. Pulmonic Valve: The pulmonic  valve was not well visualized. Pulmonic valve regurgitation is not visualized. Aorta: Aortic dilatation noted. There is mild dilatation of the ascending aorta, measuring 40 mm. Venous: The inferior vena cava is dilated in size with less than 50% respiratory variability, suggesting right atrial pressure of 15 mmHg. IAS/Shunts: The interatrial septum was not well visualized.  LEFT VENTRICLE PLAX 2D LVIDd:         3.90 cm LVIDs:         3.25 cm LV PW:         1.30 cm LV IVS:        1.30 cm  RIGHT VENTRICLE  IVC RV Basal diam:  2.50 cm    IVC diam: 2.70 cm RV S prime:     5.46 cm/s TAPSE (M-mode): 1.4 cm LEFT ATRIUM             Index        RIGHT ATRIUM           Index LA diam:        3.90 cm 2.07 cm/m   RA Area:     16.90 cm LA Vol (A2C):   67.6 ml 35.95 ml/m  RA Volume:   40.10 ml  21.32 ml/m LA Vol (A4C):   41.0 ml 21.80 ml/m LA Biplane Vol: 53.0 ml 28.18 ml/m  AORTIC VALVE              PULMONIC VALVE AV Vmax:      217.00 cm/s PV Vmax:       1.02 m/s AV Peak Grad: 18.8 mmHg   PV Peak grad:  4.2 mmHg  AORTA Ao Root diam: 4.00 cm Ao Asc diam:  4.00 cm MITRAL VALVE               TRICUSPID VALVE MV Area (PHT): 3.89 cm    TR Peak grad:   25.6 mmHg MV Decel Time: 195 msec    TR Vmax:        253.00 cm/s MV E velocity: 74.10 cm/s MV A velocity: 43.30 cm/s MV E/A ratio:  1.71 Lyman Bishop MD Electronically signed by Lyman Bishop MD Signature Date/Time: 10/06/2021/2:17:19 PM    Final     Labs: BMET Recent Labs  Lab 10/04/21 0413 10/04/21 1558 10/05/21 0408 10/05/21 1620 10/06/21 0258 10/06/21 1435 10/07/21 0411  NA 137 133* 135 133* 131* 135 135  K 4.4 4.9 5.0 5.2* 4.9 5.2* 4.8  CL 100 102 100 98 97* 102 101  CO2 28 27 27 27 27 26 26   GLUCOSE 117* 141* 109* 145* 203* 120* 116*  BUN 15 18 19 21 22  25* 27*  CREATININE 1.34* 1.42* 1.38* 1.42* 1.51* 1.47* 1.57*  CALCIUM 8.4* 7.8* 8.2* 8.0* 7.9* 8.1* 8.1*  PHOS 1.9*   1.9* 1.7*   1.7* 1.7*   1.7* 1.3* 1.3* 1.4* 1.9*    CBC Recent Labs   Lab 10/04/21 0413 10/05/21 0408 10/06/21 0258 10/07/21 0814  WBC 14.5* 13.0* 10.9* 11.1*  HGB 8.6* 8.3* 7.8* 8.5*  HCT 27.3* 26.5* 25.7* 26.5*  MCV 91.0 93.6 95.2 92.0  PLT 488* 434* 404* 375     Medications:     sodium chloride   Intravenous Once   sodium chloride   Intravenous Once   sodium chloride   Intravenous Once   sodium chloride   Intravenous Once   acetaminophen  500 mg Oral Q6H   amiodarone  200 mg Oral BID   aspirin  81 mg Oral Daily   Chlorhexidine Gluconate Cloth  6 each Topical Daily   darbepoetin (ARANESP) injection - NON-DIALYSIS  60 mcg Subcutaneous Q Mon-1800   docusate  200 mg Per Tube Daily   feeding supplement  237 mL Oral TID BM   mouth rinse  15 mL Mouth Rinse BID   metoCLOPramide (REGLAN) injection  5 mg Intravenous Q8H   midodrine  15 mg Oral TID WC   montelukast  5 mg Oral QHS   multivitamin  1 tablet Oral QHS   pantoprazole  40 mg Oral Daily   polyethylene glycol  17 g Oral Daily   rosuvastatin  10  mg Oral Daily   sodium chloride flush  10-40 mL Intracatheter Q12H   sodium chloride flush  3 mL Intravenous Q12H   Warfarin - Pharmacist Dosing Inpatient   Does not apply q1600   Jannifer Hick MD [facility] Pager [phone]

## 2021-10-07 NOTE — Progress Notes (Signed)
Physical Therapy Treatment Patient Details Name: Isaac [name] MRN: [id number] DOB: [birth year] Today's Date: 10/07/2021   History of Present Illness Pt is a 71 y.o. male admitted 09/07/21 with near-syncope, nausea, poor oral intake, generalized weakness. Workup for advanced heart failure and persistent inotropic with requirement, cardiogenic shock. [place] 12/19. Impella placed on 12/23. LVAD placed 12/28. Pt with post-op delirium and agitation. CRRT initiated 1/3. S/p thoracentesis 1/12. S/p cortrak placement 1/13. PMH includes advanced CHF (LVEF <20%), AICD/PPM, PAF on Eliquis, CKD 3, HTN, HLD.    PT Comments    Pt continues to make slow progress. Switched him away from [employer] walker to rollator since EVA walker is not for practical for home. Pt fatigued quicker with the rollator which isn't unexpected since rollator requires more activation of core. Continue to recommend acute inpatient rehab.    Recommendations for follow up therapy are one component of a multi-disciplinary discharge planning process, led by the attending physician.  Recommendations may be updated based on patient status, additional functional criteria and insurance authorization.  Follow Up Recommendations  Acute inpatient rehab (3hours/day)     Assistance Recommended at Discharge Frequent or constant Supervision/Assistance  Patient can return home with the following A little help with walking and/or transfers;A little help with bathing/dressing/bathroom;Assistance with cooking/housework;Assist for transportation;Help with stairs or ramp for entrance   Equipment Recommendations  Rollator (4 wheels);BSC/3in1    Recommendations for Other Services       Precautions / Restrictions Precautions Precautions: Fall;Sternal;Other (comment) Precaution Comments: LVAD, CRRT     Mobility  Bed Mobility Overal bed mobility: Needs Assistance Bed Mobility: Supine to Sit     Supine to sit: Min assist     General bed mobility  comments: Assist to elevate trunk into sitting    Transfers Overall transfer level: Needs assistance Equipment used: Rollator (4 wheels) Transfers: Sit to/from Stand Sit to Stand: Min assist, Mod assist           General transfer comment: Assist to bring hips up and for stability. Pt requires min assist from bed and mod assist from lower recliner    Ambulation/Gait Ambulation/Gait assistance: Min assist Gait Distance (Feet): 90 Feet (x2) Assistive device: Rollator (4 wheels) Gait Pattern/deviations: Step-through pattern, Decreased stride length Gait velocity: Decreased Gait velocity interpretation: <1.31 ft/sec, indicative of household ambulator   General Gait Details: Assist for stability and +1 to follow with recliner for safety   Stairs             Wheelchair Mobility    Modified Rankin (Stroke Patients Only)       Balance Overall balance assessment: Needs assistance Sitting-balance support: No upper extremity supported, Feet supported Sitting balance-Leahy Scale: Fair     Standing balance support: No upper extremity supported Standing balance-Leahy Scale: Fair                              Cognition Arousal/Alertness: Awake/alert Behavior During Therapy: Flat affect Overall Cognitive Status: Impaired/Different from baseline Area of Impairment: Memory, Problem solving                     Memory: Decreased recall of precautions, Decreased short-term memory       Problem Solving: Requires verbal cues, Slow processing General Comments: Pt requires frequent reminders of sternal precautions        Exercises      General Comments General comments (skin integrity, edema, etc.): VSS  on 2L O2      Pertinent Vitals/Pain Pain Assessment Pain Assessment: No/denies pain    Home Living                          Prior Function            PT Goals (current goals can now be found in the care plan section) Acute Rehab  PT Goals Patient Stated Goal: home    Frequency    Min 3X/week      PT Plan Current plan remains appropriate    Co-evaluation PT/OT/SLP Co-Evaluation/Treatment: Yes Reason for Co-Treatment: Complexity of the patient's impairments (multi-system involvement);For patient/therapist safety PT goals addressed during session: Mobility/safety with mobility        AM-PAC PT "6 Clicks" Mobility   Outcome Measure  Help needed turning from your back to your side while in a flat bed without using bedrails?: A Little Help needed moving from lying on your back to sitting on the side of a flat bed without using bedrails?: A Little Help needed moving to and from a bed to a chair (including a wheelchair)?: A Little Help needed standing up from a chair using your arms (e.g., wheelchair or bedside chair)?: A Lot Help needed to walk in hospital room?: A Little Help needed climbing 3-5 steps with a railing? : Total 6 Click Score: 15    End of Session Equipment Utilized During Treatment: Oxygen Activity Tolerance: Patient limited by fatigue Patient left: in chair;with call bell/phone within reach;with nursing/sitter in room;with family/visitor present Nurse Communication: Mobility status PT Visit Diagnosis: Unsteadiness on feet (R26.81);Other abnormalities of gait and mobility (R26.89);Muscle weakness (generalized) (M62.81)     Time: 5374-8270 PT Time Calculation (min) (ACUTE ONLY): 41 min  Charges:  $Gait Training: 8-22 mins                     [place] Pager [phone] Office [place] 10/07/2021, 12:34 PM

## 2021-10-07 NOTE — Progress Notes (Signed)
Occupational Therapy Treatment Patient Details Name: Tod Abrahamsen MRN: [id number] DOB: [birth year] Today's Date: 10/07/2021   History of present illness Pt is a 71 y.o. male admitted 09/07/21 with near-syncope, nausea, poor oral intake, generalized weakness. Workup for advanced heart failure and persistent inotropic with requirement, cardiogenic shock. [place] 12/19. Impella placed on 12/23. LVAD placed 12/28. Pt with post-op delirium and agitation. CRRT initiated 1/3. S/p thoracentesis 1/12. S/p cortrak placement 1/13. PMH includes advanced CHF (LVEF <20%), AICD/PPM, PAF on Eliquis, CKD 3, HTN, HLD.   OT comments  Goals reviewed and updated this session. Pt was able to perform changeover from wall power to battery power at supervision level this session. Also performing grooming tasks with set up at bed level while CRRT stopped and lines readied for ambulation/mobility. Pt educated on use of Rollator for the first time (had been using EVA walker) - Pt required cues for sternal precautions. One seated rest break during hallway ambulation with PT/OT. Pt continues to benefit from skilled OT in the acute setting and will benefit from comprehensive inpatient rehab at more intense level - excellent support from his spouse.    Recommendations for follow up therapy are one component of a multi-disciplinary discharge planning process, led by the attending physician.  Recommendations may be updated based on patient status, additional functional criteria and insurance authorization.    Follow Up Recommendations  Acute inpatient rehab (3hours/day)    Assistance Recommended at Discharge Frequent or constant Supervision/Assistance  Patient can return home with the following      Equipment Recommendations  BSC/3in1    Recommendations for Other Services      Precautions / Restrictions Precautions Precautions: Fall;Sternal;Other (comment) Precaution Comments: LVAD, CRRT Restrictions Other  Position/Activity Restrictions: Sternal Precautions       Mobility Bed Mobility Overal bed mobility: Needs Assistance Bed Mobility: Supine to Sit     Supine to sit: Min assist     General bed mobility comments: Assist to elevate trunk into sitting    Transfers Overall transfer level: Needs assistance Equipment used: Rollator (4 wheels) Transfers: Sit to/from Stand Sit to Stand: Min assist, Mod assist, +2 safety/equipment           General transfer comment: Assist to bring hips up and for stability. Pt requires min assist from bed and mod assist from lower recliner     Balance Overall balance assessment: Needs assistance Sitting-balance support: No upper extremity supported, Feet supported Sitting balance-Leahy Scale: Fair     Standing balance support: No upper extremity supported Standing balance-Leahy Scale: Fair                             ADL either performed or assessed with clinical judgement   ADL Overall ADL's : Needs assistance/impaired     Grooming: Oral care;Wash/dry face;Set up;Sitting Grooming Details (indicate cue type and reason): bed level while CRRT getting stopped for ambulation                 Toilet Transfer: Minimal assistance;Stand-pivot;BSC/3in1;+2 for safety/equipment Toilet Transfer Details (indicate cue type and reason): HHA         Functional mobility during ADLs: Minimal assistance;+2 for safety/equipment General ADL Comments: Pt did switch over from wall power to battery power, and able to name all the things that he needed with the exception of back up bag    Extremity/Trunk Assessment Upper Extremity Assessment Upper Extremity Assessment: Generalized weakness  Vision       Perception     Praxis      Cognition Arousal/Alertness: Awake/alert Behavior During Therapy: Flat affect Overall Cognitive Status: Impaired/Different from baseline Area of Impairment: Memory, Problem solving                      Memory: Decreased recall of precautions, Decreased short-term memory       Problem Solving: Requires verbal cues, Slow processing General Comments: Pt requires frequent reminders of sternal precautions        Exercises      Shoulder Instructions       General Comments VSS on 2L O2    Pertinent Vitals/ Pain       Pain Assessment Pain Assessment: No/denies pain  Home Living                                          Prior Functioning/Environment              Frequency  Min 2X/week        Progress Toward Goals  OT Goals(current goals can now be found in the care plan section)  Progress towards OT goals: Progressing toward goals  Acute Rehab OT Goals 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 Additional ADL Goal #3: Pt will manage LVAD equipment independently.  Plan Frequency remains appropriate;Discharge plan needs to be updated    Co-evaluation    PT/OT/SLP Co-Evaluation/Treatment: Yes Reason for Co-Treatment: Complexity of the patient's impairments (multi-system involvement);For patient/therapist safety;To address functional/ADL transfers PT goals addressed during session: Mobility/safety with mobility;Balance OT goals addressed during session: ADL's and self-care;Strengthening/ROM;Proper use of Adaptive equipment and DME      AM-PAC OT "6  Clicks" Daily Activity     Outcome Measure   Help from another person eating meals?: None Help from another person taking care of personal grooming?: A Little Help from another person toileting, which includes using toliet, bedpan, or urinal?: A Lot Help from another person bathing (including washing, rinsing, drying)?: A Lot Help from another person to put on and taking off regular upper body clothing?: A Lot Help from another person to put on and taking off regular lower body clothing?: A Lot 6 Click Score: 15    End of Session Equipment Utilized During Treatment: Rollator (4 wheels);Oxygen (2L)  OT Visit Diagnosis: Unsteadiness on feet (R26.81);Muscle weakness (generalized) (M62.81);Other symptoms and signs involving cognitive function;Other (comment);Pain (decreased activity tolerance)   Activity Tolerance Patient tolerated treatment well   Patient Left in chair;with call bell/phone within reach;with family/visitor present   Nurse Communication Mobility status        Time: 5397-6734 OT Time Calculation (min): 46 min  Charges: OT General Charges $OT Visit: 1 Visit OT Treatments $Self Care/Home Management : 8-22 mins  Jesse Sans OTR/L Acute Rehabilitation Services Pager: [phone] Office: [phone]  [place] Ray  Uriel Dowding 10/07/2021, 1:22 PM

## 2021-10-07 NOTE — Progress Notes (Addendum)
[place] for Warfarin  Indication: LVAD/AF  Allergies  Allergen Reactions   Mushroom Extract Complex Nausea And Vomiting   Neosporin [Neomycin-Bacitracin Zn-Polymyx] Hives    Patient Measurements: Height: 5\' 9"  (175.3 cm) Weight: 71.5 kg (157 lb 10.1 oz) IBW/kg (Calculated) : 70.7 Heparin Dosing Weight: 83.7 kg  Vital Signs: Temp: 98.4 F (36.9 C) (01/17 0700) Temp Source: Oral (01/17 0700) BP: 72/60 (01/17 0700) Pulse Rate: 102 (01/17 0700)  Labs: Recent Labs    10/05/21 0408 10/05/21 1620 10/06/21 0258 10/06/21 1435 10/07/21 0411 10/07/21 0617  HGB 8.3*  --  7.8*  --   --   --   HCT 26.5*  --  25.7*  --   --   --   PLT 434*  --  404*  --   --   --   LABPROT 18.6*  --  17.1*  --  17.0*  --   INR 1.6*  --  1.4*  --  1.4*  --   HEPARINUNFRC  --   --   --  <0.10*  --  <0.10*  CREATININE 1.38*   < > 1.51* 1.47* 1.57*  --    < > = values in this interval not displayed.     Estimated Creatinine Clearance: 44.4 mL/min (A) (by C-G formula based on SCr of 1.57 mg/dL (H)).   Medical History: Past Medical History:  Diagnosis Date   Arrhythmia    atrial fibrillation   CHF (congestive heart failure) (HCC)    Chronic kidney disease    Coronary artery disease    Hyperlipidemia    Hypertension    Myocardial infarct [facility])     Assessment: 71 yo male on chronic Eliquis PTA for afib.  Now s/p LVAD placement 09/17/21, pharmacy asked to begin anticoagulation with warfarin.   INR today came back subtherapeutic at 1.4 - same from yesterday. Heparin level is undetectable, on heparin 500 units/hr. Hgb stable at 8.5, plt 375. LDH stable in 200s. No s/sx of bleeding or infusion issues.    Goal of Therapy:  INR 2-2.5 Monitor platelets by anticoagulation protocol: Yes   Plan:  -Warfarin 4 mg PO x1 today -Continue heparin 500 units/hr fixed dose until INR >/= 1.8 -Daily INR, CBC, LDH, HL   Antonietta Jewel, PharmD, BCCCP Clinical  Pharmacist  Phone: [phone] 10/07/2021 7:50 AM  Please check AMION for all [facility] phone numbers After 10:00 PM, call [place] [phone]

## 2021-10-08 ENCOUNTER — Inpatient Hospital Stay (HOSPITAL_COMMUNITY): Payer: Medicare HMO

## 2021-10-08 DIAGNOSIS — R570 Cardiogenic shock: Secondary | ICD-10-CM | POA: Diagnosis not present

## 2021-10-08 DIAGNOSIS — I5023 Acute on chronic systolic (congestive) heart failure: Secondary | ICD-10-CM

## 2021-10-08 DIAGNOSIS — Z95811 Presence of heart assist device: Secondary | ICD-10-CM | POA: Diagnosis not present

## 2021-10-08 LAB — RENAL FUNCTION PANEL
Albumin: 2 g/dL — ABNORMAL LOW (ref 3.5–5.0)
Albumin: 2 g/dL — ABNORMAL LOW (ref 3.5–5.0)
Anion gap: 7 (ref 5–15)
Anion gap: 10 (ref 5–15)
BUN: 28 mg/dL — ABNORMAL HIGH (ref 8–23)
BUN: 27 mg/dL — ABNORMAL HIGH (ref 8–23)
CO2: 26 mmol/L (ref 22–32)
CO2: 27 mmol/L (ref 22–32)
Calcium: 8 mg/dL — ABNORMAL LOW (ref 8.9–10.3)
Calcium: 8 mg/dL — ABNORMAL LOW (ref 8.9–10.3)
Chloride: 100 mmol/L (ref 98–111)
Chloride: 98 mmol/L (ref 98–111)
Creatinine, Ser: 1.39 mg/dL — ABNORMAL HIGH (ref 0.61–1.24)
Creatinine, Ser: 1.37 mg/dL — ABNORMAL HIGH (ref 0.61–1.24)
GFR, Estimated: 55 mL/min — ABNORMAL LOW (ref >60)
GFR, Estimated: 56 mL/min — ABNORMAL LOW (ref >60)
Glucose, Bld: 160 mg/dL — ABNORMAL HIGH (ref 70–99)
Glucose, Bld: 130 mg/dL — ABNORMAL HIGH (ref 70–99)
Phosphorus: 1.5 mg/dL — ABNORMAL LOW (ref 2.5–4.6)
Phosphorus: 2.1 mg/dL — ABNORMAL LOW (ref 2.5–4.6)
Potassium: 4.8 mmol/L (ref 3.5–5.1)
Potassium: 4.8 mmol/L (ref 3.5–5.1)
Sodium: 133 mmol/L — ABNORMAL LOW (ref 135–145)
Sodium: 135 mmol/L (ref 135–145)

## 2021-10-08 LAB — COOXEMETRY PANEL
Carboxyhemoglobin: 2 % — ABNORMAL HIGH (ref 0.5–1.5)
Carboxyhemoglobin: 1.6 % — ABNORMAL HIGH (ref 0.5–1.5)
Carboxyhemoglobin: 1.9 % — ABNORMAL HIGH (ref 0.5–1.5)
Methemoglobin: 0.9 % (ref 0.0–1.5)
Methemoglobin: 0.7 % (ref 0.0–1.5)
Methemoglobin: 0.9 % (ref 0.0–1.5)
O2 Saturation: 48.1 %
O2 Saturation: 39.2 %
O2 Saturation: 51.9 %
Total hemoglobin: 8.3 g/dL — ABNORMAL LOW (ref 12.0–16.0)
Total hemoglobin: 8.5 g/dL — ABNORMAL LOW (ref 12.0–16.0)
Total hemoglobin: 11.5 g/dL — ABNORMAL LOW (ref 12.0–16.0)

## 2021-10-08 LAB — HEPARIN LEVEL (UNFRACTIONATED)
Heparin Unfractionated: 0.17 IU/mL — ABNORMAL LOW (ref 0.30–0.70)
Heparin Unfractionated: <0.1 IU/mL — ABNORMAL LOW (ref 0.30–0.70)

## 2021-10-08 LAB — CBC
HCT: 26.9 % — ABNORMAL LOW (ref 39.0–52.0)
Hemoglobin: 8.3 g/dL — ABNORMAL LOW (ref 13.0–17.0)
MCH: 29.2 pg (ref 26.0–34.0)
MCHC: 30.9 g/dL (ref 30.0–36.0)
MCV: 94.7 fL (ref 80.0–100.0)
Platelets: 358 10*3/uL (ref 150–400)
RBC: 2.84 MIL/uL — ABNORMAL LOW (ref 4.22–5.81)
RDW: 21.3 % — ABNORMAL HIGH (ref 11.5–15.5)
WBC: 12.4 10*3/uL — ABNORMAL HIGH (ref 4.0–10.5)
nRBC: 0.2 % (ref 0.0–0.2)

## 2021-10-08 LAB — GLUCOSE, CAPILLARY
Glucose-Capillary: 108 mg/dL — ABNORMAL HIGH (ref 70–99)
Glucose-Capillary: 113 mg/dL — ABNORMAL HIGH (ref 70–99)
Glucose-Capillary: 119 mg/dL — ABNORMAL HIGH (ref 70–99)
Glucose-Capillary: 120 mg/dL — ABNORMAL HIGH (ref 70–99)
Glucose-Capillary: 126 mg/dL — ABNORMAL HIGH (ref 70–99)
Glucose-Capillary: 137 mg/dL — ABNORMAL HIGH (ref 70–99)
Glucose-Capillary: 96 mg/dL (ref 70–99)

## 2021-10-08 LAB — ECHOCARDIOGRAM COMPLETE
Area-P 1/2: 4.52 cm2
Height: 69 in
S' Lateral: 3.3 cm
Weight: 2500.9 oz

## 2021-10-08 LAB — LACTATE DEHYDROGENASE: LDH: 264 U/L — ABNORMAL HIGH (ref 98–192)

## 2021-10-08 LAB — PROTIME-INR
INR: 1.5 — ABNORMAL HIGH (ref 0.8–1.2)
INR: 1.5 — ABNORMAL HIGH (ref 0.8–1.2)
Prothrombin Time: 18.4 seconds — ABNORMAL HIGH (ref 11.4–15.2)
Prothrombin Time: 18.3 seconds — ABNORMAL HIGH (ref 11.4–15.2)

## 2021-10-08 LAB — PREALBUMIN: Prealbumin: 9.5 mg/dL — ABNORMAL LOW (ref 18–38)

## 2021-10-08 LAB — MAGNESIUM: Magnesium: 2.3 mg/dL (ref 1.7–2.4)

## 2021-10-08 LAB — BRAIN NATRIURETIC PEPTIDE: B Natriuretic Peptide: 625.4 pg/mL — ABNORMAL HIGH (ref 0.0–100.0)

## 2021-10-08 MED ORDER — SENNA 8.6 MG PO TABS
2.0000 | ORAL_TABLET | Freq: Every day | ORAL | Status: DC
  Administered 2021-10-17 – 2021-10-19 (×3): 17.2 mg via ORAL
  Filled 2021-10-08 (×7): qty 2

## 2021-10-08 MED ORDER — ROSUVASTATIN CALCIUM 5 MG PO TABS
10.0000 mg | ORAL_TABLET | Freq: Every day | ORAL | Status: DC
  Administered 2021-10-08 – 2021-10-24 (×17): 10 mg via ORAL
  Filled 2021-10-08 (×17): qty 2

## 2021-10-08 MED ORDER — PANTOPRAZOLE SODIUM 40 MG PO TBEC
40.0000 mg | DELAYED_RELEASE_TABLET | Freq: Every day | ORAL | Status: DC
  Administered 2021-10-08 – 2021-10-23 (×15): 40 mg via ORAL
  Filled 2021-10-08 (×16): qty 1

## 2021-10-08 MED ORDER — AMIODARONE HCL 200 MG PO TABS
200.0000 mg | ORAL_TABLET | Freq: Two times a day (BID) | ORAL | Status: DC
  Administered 2021-10-08 – 2021-10-18 (×22): 200 mg via ORAL
  Filled 2021-10-08 (×22): qty 1

## 2021-10-08 MED ORDER — WARFARIN SODIUM 5 MG PO TABS
5.0000 mg | ORAL_TABLET | Freq: Once | ORAL | Status: AC
  Administered 2021-10-08: 5 mg via ORAL
  Filled 2021-10-08: qty 1

## 2021-10-08 MED ORDER — MONTELUKAST SODIUM 10 MG PO TABS
5.0000 mg | ORAL_TABLET | Freq: Every day | ORAL | Status: DC
  Administered 2021-10-08 – 2021-10-23 (×16): 5 mg via ORAL
  Filled 2021-10-08 (×16): qty 1

## 2021-10-08 MED ORDER — DOCUSATE SODIUM 100 MG PO CAPS
100.0000 mg | ORAL_CAPSULE | Freq: Two times a day (BID) | ORAL | Status: DC
  Administered 2021-10-11 – 2021-10-23 (×11): 100 mg via ORAL
  Filled 2021-10-08 (×18): qty 1

## 2021-10-08 MED ORDER — SODIUM PHOSPHATES 45 MMOLE/15ML IV SOLN
15.0000 mmol | Freq: Once | INTRAVENOUS | Status: AC
  Administered 2021-10-08: 15 mmol via INTRAVENOUS
  Filled 2021-10-08: qty 5

## 2021-10-08 MED ORDER — ASPIRIN 81 MG PO CHEW
81.0000 mg | CHEWABLE_TABLET | Freq: Every day | ORAL | Status: AC
  Administered 2021-10-08 – 2021-10-18 (×11): 81 mg via ORAL
  Filled 2021-10-08 (×11): qty 1

## 2021-10-08 MED ORDER — ACETAMINOPHEN 500 MG PO TABS
500.0000 mg | ORAL_TABLET | Freq: Four times a day (QID) | ORAL | Status: DC
  Administered 2021-10-08 (×2): 500 mg via ORAL
  Filled 2021-10-08 (×2): qty 1

## 2021-10-08 MED ORDER — RENA-VITE PO TABS
1.0000 | ORAL_TABLET | Freq: Every day | ORAL | Status: DC
  Administered 2021-10-08 – 2021-10-23 (×16): 1 via ORAL
  Filled 2021-10-08 (×16): qty 1

## 2021-10-08 MED ORDER — MIDODRINE HCL 5 MG PO TABS
20.0000 mg | ORAL_TABLET | Freq: Three times a day (TID) | ORAL | Status: DC
  Administered 2021-10-08 – 2021-10-15 (×21): 20 mg via ORAL
  Filled 2021-10-08 (×20): qty 4

## 2021-10-08 MED ORDER — MILRINONE LACTATE IN DEXTROSE 20-5 MG/100ML-% IV SOLN
0.1250 ug/kg/min | INTRAVENOUS | Status: DC
  Administered 2021-10-08 – 2021-10-11 (×7): 0.375 ug/kg/min via INTRAVENOUS
  Administered 2021-10-11: 0.25 ug/kg/min via INTRAVENOUS
  Administered 2021-10-12 (×2): 0.125 ug/kg/min via INTRAVENOUS
  Filled 2021-10-08 (×10): qty 100

## 2021-10-08 NOTE — Progress Notes (Addendum)
Patient ID: Isaac [name], male   DOB: [birth year], 71 y.o.   MRN: [id number]    Advanced Heart Failure VAD Team Note  PCP-Cardiologist: None   Subjective:    12/19 RHC- RA 7, PA 39/14 (25), PCWP 11, CO 6.4 CI 3. Thermo 3.5 1.8. Lasix drip stopped after cath. 12/20 Swan removed.  Norepi 3 mcg added.  12/21 Fever. Blood CX drawn. UA + leukocytes. Started on vanc and cefepime. Diuresed with IV lasix + metolazone.  12/23 Impella 5.5 placed 12/28 HM III LVAD, ICD leads plastered to tricuspid valve with severe TR, valve not replaced and some improvement in TR with LVAD placement.  12/29 Extubated 01/03 Started CRRT 01/07  L thoracentesis and placement of [facility] 01/09 LVAD speed increased to 5600 rpm 01/10 Milrinone stopped. Amio gtt>>PO. Completed 5 day course Cefepime.  01/12 Ramp echo done: Moderate lateral pericardial effusion w/o tamponade, RV larger with severe dilation/moderate dysfunction and left shift of the septum, LV small and undershifted.  TR severe.  LVAD speed decreased to 5000 rpms at 2 different sessions.  Also had thoracentesis on left with 1.3 L off.  01/13 TFs begun 01/16 echo reviewed: EF 25-30%, LV larger than prior but septum still with left shift, RV moderately dilated and moderately dysfunctional, moderate-severe TR, mod-large localized pericardial effusion lateral to the LV.   He remains on milrinone 0.25 and NE 1, MAP 60-70s .  CO-OX 39%--> milrinone increased to 0.375 mcg.   CVVH ongoing, have been trying to pull 100 cc/hr.  Weight down another pound.   LDH stable 264 INR 1.5, on heparin gtt  Hgb 8.3   Wants Cortrak out. Complaining of fatigue.   LVAD INTERROGATION:  HeartMate III LVAD:   Flow 3.9  liters/min, speed 5000, power 3 PI 3.9   > 70 PI events.   VAD interrogated personally.      Objective:    Vital Signs:   Temp:  [98.1 F (36.7 C)-98.4 F (36.9 C)] 98.4 F (36.9 C) (01/18 0400) Pulse Rate:  [30-177] 177 (01/18 0645) Resp:  [15-31] 25  (01/18 0700) BP: (63-125)/(51-96) 70/58 (01/18 0700) SpO2:  [80 %-100 %] 80 % (01/18 0645) Weight:  [70.9 kg] 70.9 kg (01/18 0500) Last BM Date: 10/07/21 Mean arterial Pressure  60-70s   Intake/Output:   Intake/Output Summary (Last 24 hours) at 10/08/2021 0732 Last data filed at 10/08/2021 0700 Gross per 24 hour  Intake 2606.69 ml  Output 4422 ml  Net -1815.31 ml     Physical Exam   CVP 10-12 Physical Exam: GENERAL: No acute distress. Sitting in the chair.  HEENT: + Cortrak NECK: Supple, JVP 9-10 .  2+ bilaterally, no bruits.  No lymphadenopathy or thyromegaly appreciated.  RIJ HD cath CARDIAC:  Tachy irregular. Mechanical heart sounds with LVAD hum present.  LUNGS:  Clear to auscultation bilaterally.  ABDOMEN:  Soft, round, nontender, positive bowel sounds x4.     LVAD exit site: well-healed and incorporated.  Dressing dry and intact.  No erythema or drainage.  Stabilization device present and accurately applied.  Driveline dressing is being changed daily per sterile technique. EXTREMITIES:  Warm and dry, no cyanosis, clubbing, rash or edema . RUE PICC  NEUROLOGIC:  Alert and oriented x 3.    No aphasia.  No dysarthria.  Affect pleasant.  Skin: Pressure ulcer apex buttocks.     Telemetry    Tachycardic. 100s ? EKG now.    Labs   Basic Metabolic Panel: Recent Labs  Lab  10/04/21 1558 10/05/21 0408 10/05/21 1620 10/06/21 0258 10/06/21 1435 10/07/21 0411 10/07/21 1626 10/08/21 0316  NA 133* 135   < > 131* 135 135 132* 133*  K 4.9 5.0   < > 4.9 5.2* 4.8 4.7 4.8  CL 102 100   < > 97* 102 101 97* 100  CO2 27 27   < > 27 26 26 27 26   GLUCOSE 141* 109*   < > 203* 120* 116* 171* 160*  BUN 18 19   < > 22 25* 27* 27* 28*  CREATININE 1.42* 1.38*   < > 1.51* 1.47* 1.57* 1.51* 1.39*  CALCIUM 7.8* 8.2*   < > 7.9* 8.1* 8.1* 7.9* 8.0*  MG 2.4 2.3  --  2.4  --  2.2  --  2.3  PHOS 1.7*   1.7* 1.7*   1.7*   < > 1.3* 1.4* 1.9* 3.4 1.5*   < > = values in this interval not  displayed.    Liver Function Tests: Recent Labs  Lab 10/06/21 0258 10/06/21 1435 10/07/21 0411 10/07/21 1626 10/08/21 0316  ALBUMIN 1.9* 2.1* 2.0* 1.9* 2.0*   No results for input(s): LIPASE, AMYLASE in the last 168 hours. No results for input(s): AMMONIA in the last 168 hours.   CBC: Recent Labs  Lab 10/04/21 0413 10/05/21 0408 10/06/21 0258 10/07/21 0814 10/08/21 0316  WBC 14.5* 13.0* 10.9* 11.1* 12.4*  HGB 8.6* 8.3* 7.8* 8.5* 8.3*  HCT 27.3* 26.5* 25.7* 26.5* 26.9*  MCV 91.0 93.6 95.2 92.0 94.7  PLT 488* 434* 404* 375 358    INR: Recent Labs  Lab 10/05/21 0408 10/06/21 0258 10/07/21 0411 10/08/21 0316 10/08/21 0542  INR 1.6* 1.4* 1.4* 1.5* 1.5*    Other results:   Imaging   ECHOCARDIOGRAM COMPLETE  Result Date: 10/06/2021    ECHOCARDIOGRAM REPORT   Patient Name:   WALFRED BETTENDORF Date of Exam: 10/06/2021 Medical Rec #:  [id number]           Height:       69.0 in Accession #:    [id number]          Weight:       160.3 lb Date of Birth:  [birth year]            BSA:          1.880 m Patient Age:    53 years            BP:           83/68 mmHg Patient Gender: M                   HR:           60 bpm. Exam Location:  Inpatient Procedure: 2D Echo Indications:    Assess LVAD  History:        Patient has prior history of Echocardiogram examinations, most                 recent 10/02/2021. CHF, CAD; Arrythmias:Atrial Fibrillation.  Sonographer:    Jefferey Pica Referring Phys: [place]  1. LVAD inflow cannula visualized at the apex (image 12)- no obvious thrombus. No device adjustments were noted. Left ventricular ejection fraction, by estimation, is 25 to 30%. The left ventricle has severely decreased function. The left ventricle has no regional wall motion abnormalities. There is mild left ventricular hypertrophy. Left ventricular diastolic function could not be evaluated.  2. Right ventricular systolic function  is moderately reduced. The right  ventricular size is mildly enlarged. There is mildly elevated pulmonary artery systolic pressure. The estimated right ventricular systolic pressure is 47.0 mmHg.  3. Large pericardial effusion. The pericardial effusion is circumferential (lateral, LV posterior, apical).  4. The mitral valve is normal in structure. No evidence of mitral valve regurgitation.  5. Tricuspid valve regurgitation is moderate to severe.  6. The aortic valve does not open. The aortic valve is abnormal. Aortic valve regurgitation is trivial.  7. Aortic dilatation noted. There is mild dilatation of the ascending aorta, measuring 40 mm.  8. The inferior vena cava is dilated in size with <50% respiratory variability, suggesting right atrial pressure of 15 mmHg. Comparison(s): Changes from prior study are noted. 10/02/2021: LVEF 25-30%, moderate-sized lateral pericardial effusion. Conclusion(s)/Recommendation(s): Pericardial effusion appears similar to prior study on 10/02/21, but is larger than reported. Unable to assess for tamponade physiology in the presence of LVAD - clinical correlation is advised. FINDINGS  Left Ventricle: LVAD inflow cannula visualized at the apex (image 12)- no obvious thrombus. No device adjustments were noted. Left ventricular ejection fraction, by estimation, is 25 to 30%. The left ventricle has severely decreased function. The left ventricle has no regional wall motion abnormalities. The left ventricular internal cavity size was normal in size. There is mild left ventricular hypertrophy. Left ventricular diastolic function could not be evaluated due to nondiagnostic images. Left ventricular diastolic function could not be evaluated. Right Ventricle: The right ventricular size is mildly enlarged. No increase in right ventricular wall thickness. Right ventricular systolic function is moderately reduced. There is mildly elevated pulmonary artery systolic pressure. The tricuspid regurgitant velocity is 2.53 m/s, and with  an assumed right atrial pressure of 15 mmHg, the estimated right ventricular systolic pressure is 96.2 mmHg. Left Atrium: Left atrial size was normal in size. Right Atrium: Right atrial size was normal in size. Pericardium: A large pericardial effusion is present. The pericardial effusion is circumferential. Mitral Valve: The mitral valve is normal in structure. No evidence of mitral valve regurgitation. Tricuspid Valve: The tricuspid valve is not well visualized. Tricuspid valve regurgitation is moderate to severe. Aortic Valve: The aortic valve does not open. The aortic valve is abnormal. Aortic valve regurgitation is trivial. Aortic valve peak gradient measures 18.8 mmHg. Pulmonic Valve: The pulmonic valve was not well visualized. Pulmonic valve regurgitation is not visualized. Aorta: Aortic dilatation noted. There is mild dilatation of the ascending aorta, measuring 40 mm. Venous: The inferior vena cava is dilated in size with less than 50% respiratory variability, suggesting right atrial pressure of 15 mmHg. IAS/Shunts: The interatrial septum was not well visualized.  LEFT VENTRICLE PLAX 2D LVIDd:         3.90 cm LVIDs:         3.25 cm LV PW:         1.30 cm LV IVS:        1.30 cm  RIGHT VENTRICLE            IVC RV Basal diam:  2.50 cm    IVC diam: 2.70 cm RV S prime:     5.46 cm/s TAPSE (M-mode): 1.4 cm LEFT ATRIUM             Index        RIGHT ATRIUM           Index LA diam:        3.90 cm 2.07 cm/m   RA Area:  16.90 cm LA Vol (A2C):   67.6 ml 35.95 ml/m  RA Volume:   40.10 ml  21.32 ml/m LA Vol (A4C):   41.0 ml 21.80 ml/m LA Biplane Vol: 53.0 ml 28.18 ml/m  AORTIC VALVE              PULMONIC VALVE AV Vmax:      217.00 cm/s PV Vmax:       1.02 m/s AV Peak Grad: 18.8 mmHg   PV Peak grad:  4.2 mmHg  AORTA Ao Root diam: 4.00 cm Ao Asc diam:  4.00 cm MITRAL VALVE               TRICUSPID VALVE MV Area (PHT): 3.89 cm    TR Peak grad:   25.6 mmHg MV Decel Time: 195 msec    TR Vmax:        253.00 cm/s MV E  velocity: 74.10 cm/s MV A velocity: 43.30 cm/s MV E/A ratio:  1.71 Lyman Bishop MD Electronically signed by Lyman Bishop MD Signature Date/Time: 10/06/2021/2:17:19 PM    Final      Medications:     Scheduled Medications:  sodium chloride   Intravenous Once   sodium chloride   Intravenous Once   sodium chloride   Intravenous Once   sodium chloride   Intravenous Once   acetaminophen (TYLENOL) oral liquid 160 mg/5 mL  500 mg Per Tube Q6H   amiodarone  200 mg Per Tube BID   aspirin  81 mg Per Tube Daily   Chlorhexidine Gluconate Cloth  6 each Topical Daily   darbepoetin (ARANESP) injection - NON-DIALYSIS  60 mcg Subcutaneous Q Mon-1800   docusate  200 mg Per Tube Daily   feeding supplement  237 mL Oral TID BM   mouth rinse  15 mL Mouth Rinse BID   metoCLOPramide (REGLAN) injection  5 mg Intravenous Q8H   [START ON 10/15/2021] midodrine  20 mg Per Tube TID WC   montelukast  5 mg Per Tube QHS   multivitamin  1 tablet Per Tube QHS   pantoprazole sodium  40 mg Per Tube Daily   polyethylene glycol  17 g Per Tube Daily   rosuvastatin  10 mg Per Tube Daily   sodium chloride flush  10-40 mL Intracatheter Q12H   sodium chloride flush  3 mL Intravenous Q12H   Warfarin - Pharmacist Dosing Inpatient   Does not apply q1600    Infusions:   prismasol BGK 4/2.5 400 mL/hr at 10/08/21 0014    prismasol BGK 4/2.5 200 mL/hr at 10/06/21 0955   sodium chloride Stopped (09/20/21 0304)   sodium chloride Stopped (09/18/21 0803)   sodium chloride Stopped (10/02/21 0900)   epinephrine Stopped (10/01/21 1017)   feeding supplement (PIVOT 1.5 CAL) 1,000 mL (10/08/21 0030)   heparin 500 Units/hr (10/08/21 0700)   iron sucrose Stopped (10/07/21 1315)   lactated ringers     milrinone 0.25 mcg/kg/min (10/08/21 0700)   norepinephrine (LEVOPHED) Adult infusion 1 mcg/min (10/08/21 0700)   prismasol BGK 4/2.5 1,800 mL/hr at 10/08/21 0411   promethazine (PHENERGAN) injection (IM or IVPB) Stopped (10/01/21 1714)     PRN Medications: sodium chloride, sodium chloride, albuterol, heparin, heparin, lip balm, morphine injection, ondansetron (ZOFRAN) IV, oxyCODONE, polyethylene glycol, promethazine (PHENERGAN) injection (IM or IVPB), sodium chloride flush, sodium chloride flush, traMADol   Assessment/Plan:    1. Acute on chronic systolic CHF:  Long-standing cardiomyopathy.  [place].  Echo this admission with EF < 20%,  severe LV dilation, restrictive diastolic function, moderate RV dysfunction, moderate MR, mod-severe TR. Cause of cardiomyopathy is uncertain.  He has a history of CAD, but I do not think that the described CAD from the past could explain his cardiomyopathy, but CAD could have progressed.  With difficulty tolerating GDMT/need for midodrine and cardiorenal syndrome as well as profound volume overload + NYHA class IV symptoms,  concerned for low output HF. Co-ox off milrinone was 36%, milrinone started and increased to 0.375 but CO remained low. NE added and Impella 5.5 placed 12/23. EF 10% on TEE 12/23. s/p HM III VAD on 12/28.  Ramp echo 01/03 with EF 20-25%, RV moderately reduced, septum mid-line.  Ramp echo again on 1/12 showed severely dilated/moderately dysfunctional RV with severe TR and left-shifted septum with small LV.  Speed decreased on 1/12 to 5000 rpm over 2 sessions due to RV failure and milrinone 0.25 + NE 3 restarted.  Echo repeated on 1/16, RV looks better and LV larger but still with left shift of the septum.  Able to increase CVVH, weight down significantly.   MAP 60s today, CVP 10-12 with co-ox 39% . Milrinone increased to 0.375 mcg. Norepi increased to 2 mcg.  - Continue midodrine 20 mg tid.   - He does not have BP room for sildenafil.  - Warfarin for INR 2-2.5.  INR 1.5 today, adjust warfarin and continue low dose heparin for now.   - Continue 81 mg aspirin - Continue UNNA boots 2. Tricuspid regurgitation:  Tricuspid repair not done at time of VAD d/t proximity of  ICD wires and hypotension during surgery.  He has severe TR.  3. Atrial fibrillation: Persistent since 10/22 based on device interrogation. Check EKG now. Tachycardic. ? A fib.  - Continue PO amio 200 bid  4. AKI on CKD stage 3: Gradual rise (was about 1.7 pre-op) since surgery.  He has not been hypotensive post-op, suspect intra-op hypotension led to development of ATN => urine sediment looked like ATN per renal. Now on CVVH. TDC placed 1/7.  - Maintain MAP and CO as above - Aim for net negative 100 cc/hr UF.  - Nephrology following, suspect he will need iHD eventually.    5. CAD: History of PCI to OM1 in 2007 and RCA in 2013.  No CP or ACs.  - Deferred cardiac cath due to AKI and plan for VAD - Continue Crestor.  6. ID: Had fever 12/21, PCT 1.03.  Blood cultures negative. ?Phlebitis at IV site. Post-op fever with elevated WBCs, PCT 2.63 -> 1.78.  He completed course of cefepime. Afebrile with stable WBCs.   7. Constipation: Resolved.  8. Hyponatremia: Hypervolemic hyponatremia.  Controlled via CVVH.  9. Anemia: 1 unit PRBCs on 1/12,  1 unit PRBs 1/13, 1 unit 1/16. No overt bleeding, ?losses via CVVH.  Hgb 8.3 today.  10. Confusion/delirium: Initially post-op and started on Precedex.  This has cleared, now up and has been walking halls 11. F/E/N: TFs via Cortrack for poor nutritional status.  Improving po intake.  - Continue TFs today.  - Check prealbumin 12. Left pleural effusion: - s/p thoracentesis 1/7 - s/p thoracentesis 1/12. 13. Pericardial effusion: Stable moderate-large pericardial effusion, primarily localized to along the lateral wall of the LV.  Does not appear to be affecting the RV/doubt tamponade.  Discussed after 1/12 echo with Dr. Cyndia Bent, conservative management for now.   I reviewed the LVAD parameters from today, and compared the results to the patient's prior recorded  data.  No programming changes were made.  The LVAD is functioning within specified parameters.  The patient  performs LVAD self-test daily.  LVAD interrogation was negative for any significant power changes, alarms or PI events/speed drops.  LVAD equipment check completed and is in good working order.  Back-up equipment present.   LVAD education done on emergency procedures and precautions and reviewed exit site care.  Darrick Grinder NP_C  10/08/2021 7:32 AM  Patient seen with NP, agree with the above note.   Patient feels worse today and objectively looks significantly worse. WBCs 12 (stable), no fever.  Over the last 24 hrs, markedly more PI events (had none the prior day).  He had no change in his speed yesterday but continued to pull fluid via CVVH, net negative -1815.  Hgb fairly stable at 8.3. MAP in 60s on milrinone 0.25 and NE 1. Co-ox 48% => 39%. CVP 12.   Feels tired, does not want to walk.   General: Well appearing this am. NAD.  HEENT: Normal. Neck: Supple, JVP 10-12 cm. Carotids OK.  Cardiac:  Mechanical heart sounds with LVAD hum present.  Lungs:  CTAB, normal effort.  Abdomen:  NT, ND, no HSM. No bruits or masses. +BS  LVAD exit site: Well-healed and incorporated. Dressing dry and intact. No erythema or drainage. Stabilization device present and accurately applied. Driveline dressing changed daily per sterile technique. Extremities:  Warm and dry. No cyanosis, clubbing, rash. Trace edema.   Neuro:  Alert & oriented x 3. Cranial nerves grossly intact. Moves all 4 extremities w/o difficulty. Affect pleasant    Cause of acute worsening uncertain, could be due to RV failure and pulling off fluid too fast via CVVH (lots of PIs starting yesterday).  Also need to rule out pericardial effusion worsening.  - Will get echo to reassess pericardial effusion. May need to decrease speed again.  - CXR for pleural effusion.  - Decrease CVVH rate today to 0-50 cc/hr net negative, looks like we may need to aim to keep CVP around 12 with RV failure.  - For now, increase milrinone to 0.375 and NE to 2.  Repeat co-ox later today.  - Continue midodrine 20 tid.   INR 1.5, continues on heparin gtt/warfarin.   Telemetry shows A-V pacing  CRITICAL CARE Performed by: Loralie Champagne  Total critical care time: 40 minutes  Critical care time was exclusive of separately billable procedures and treating other patients.  Critical care was necessary to treat or prevent imminent or life-threatening deterioration.  Critical care was time spent personally by me on the following activities: development of treatment plan with patient and/or surrogate as well as nursing, discussions with consultants, evaluation of patient's response to treatment, examination of patient, obtaining history from patient or surrogate, ordering and performing treatments and interventions, ordering and review of laboratory studies, ordering and review of radiographic studies, pulse oximetry and re-evaluation of patient's condition.  Loralie Champagne 10/08/2021 8:14 AM

## 2021-10-08 NOTE — Progress Notes (Signed)
Orthopedic Tech Progress Note Patient Details:  Isaac [name] Jun 26, 1951 [id number]  Ortho Devices Type of Ortho Device: Louretta Parma boot Ortho Device/Splint Location: BLE Ortho Device/Splint Interventions: Ordered, Application   Post Interventions Patient Tolerated: Well Instructions Provided: Care of device  Janit Pagan 10/08/2021, 3:43 PM

## 2021-10-08 NOTE — Progress Notes (Signed)
Outpatient HF CSW continuing to follow during this admission and assist as needed  Jorge Ny, [facility] Desk#: [phone] Cell#: [phone]

## 2021-10-08 NOTE — Progress Notes (Signed)
Speed  Flow  PI  Power  LVIDD  AI  Aortic opening MR  TR  Septum  RV  VTI (>18cm)  5000  3.9 4.1 3 4.2  0/5           4900 4.2 3.1 3.2 4.3  1/5       4800  3.9 3.3 3.1   5/5                                                  Doppler MAP: 74 Auto cuff BP: 80/71 (76)   Ramp ECHO performed at bedside per Dr Aundra Dubin  At completion of ramp study, patients primary controller programmed:  Fixed speed: 4900 Low speed limit: 4600    [name] RN, VAD Coordinator 24/7 pager [phone]

## 2021-10-08 NOTE — Progress Notes (Signed)
[facility] for Warfarin / Heparin Indication: LVAD/AF  Allergies  Allergen Reactions   Mushroom Extract Complex Nausea And Vomiting   Neosporin [Neomycin-Bacitracin Zn-Polymyx] Hives    Patient Measurements: Height: 5\' 9"  (175.3 cm) Weight: 70.9 kg (156 lb 4.9 oz) IBW/kg (Calculated) : 70.7 Heparin Dosing Weight: 83.7 kg  Vital Signs: Temp: 98.6 F (37 C) (01/18 0800) Temp Source: Oral (01/18 0800) BP: 92/79 (01/18 0800) Pulse Rate: 177 (01/18 0645)  Labs: Recent Labs    10/06/21 0258 10/06/21 1435 10/07/21 0411 10/07/21 0617 10/07/21 0814 10/07/21 1626 10/08/21 0316 10/08/21 0542  HGB 7.8*  --   --   --  8.5*  --  8.3*  --   HCT 25.7*  --   --   --  26.5*  --  26.9*  --   PLT 404*  --   --   --  375  --  358  --   LABPROT 17.1*  --  17.0*  --   --   --  18.4* 18.3*  INR 1.4*  --  1.4*  --   --   --  1.5* 1.5*  HEPARINUNFRC  --    < >  --  <0.10*  --   --  0.17* <0.10*  CREATININE 1.51*   < > 1.57*  --   --  1.51* 1.39*  --    < > = values in this interval not displayed.     Estimated Creatinine Clearance: 50.2 mL/min (A) (by C-G formula based on SCr of 1.39 mg/dL (H)).   Medical History: Past Medical History:  Diagnosis Date   Arrhythmia    atrial fibrillation   CHF (congestive heart failure) (HCC)    Chronic kidney disease    Coronary artery disease    Hyperlipidemia    Hypertension    Myocardial infarct [facility])     Assessment: 71 yo male on chronic Eliquis PTA for afib.  Now s/p LVAD placement 09/17/21, pharmacy asked to begin anticoagulation with warfarin.   INR remain subtherapeutic at 1.5 despite boost does Patient previously therapeutic on lower doses 1-2mg  daily prior to tube feed being started for poor nutritional intake. Cautiously increase warfarin doses though because patient wants TF removed - Vit K intake will drop when that happens.  Low fixed rate heparin drip 500 uts/hr started as bridge for low  INR - stop when INR > 1.8  Heparin level is undetectable - as expected.  Hgb stable at 8, plt stable 300s and LDH stable in 200s. No s/sx of bleeding or infusion issues.   Re-ECHO today   Goal of Therapy:  INR 2-2.5 Monitor platelets by anticoagulation protocol: Yes   Plan:  -Warfarin 5 mg PO x1 today -Continue heparin 500 units/hr fixed dose until INR >/= 1.8 -Daily INR, CBC, LDH, HL    Bonnita Nasuti Pharm.D. CPP, BCPS Clinical Pharmacist [phone] 10/08/2021 9:12 AM    Please check AMION for all [facility] phone numbers After 10:00 PM, call [place] [phone]

## 2021-10-08 NOTE — Progress Notes (Addendum)
Nutrition Follow-up  DOCUMENTATION CODES:   Severe malnutrition in context of chronic illness  INTERVENTION:   Tube Feeding via Cortrak:  Pivot 1.5 at goal rate of 65 ml/hr Goal rate provide 2340 kals, 146  g of protein 1186 mL of free water  Continue Renal MVI, continue to supplement phosphorus  Ensure Enlive po TID, each supplement provides 350 kcal and 20 grams of protein  Continue Regular diet-[place]; wife may bring in food. Encouraged pt to order "nutritionally complete meals"-protein source at each meal  Discussed meal tray and meal ordering issues with Patient Services Management Team; diet modified, plan for close follow-up by them   NUTRITION DIAGNOSIS:   Severe Malnutrition related to chronic illness (heart failure) as evidenced by severe fat depletion, severe muscle depletion.  Being addressed via TF   GOAL:   Patient will meet greater than or equal to 90% of their needs  Progressing  MONITOR:   PO intake, Supplement acceptance, Labs, Weight trends  REASON FOR ASSESSMENT:   Consult LVAD Eval  ASSESSMENT:   71 yo male admitted with acute on chronic CHF. PMH includes CHF, CAD, CKD, HLD, HTN    12/23 Impella 5.5 placed 12/28 HM3 LVAD plaecd, Impella removed, Intubated 12/29 Extubated 12/31 FL diet 01/02 Diet advanced to 2g sodium 01/03 CRRT initiated 01/11 Calorie count indicating inadequate oral intake, Cortrak recommended 01/12 Thoracentesis with 1.3 L removed 01/13 Cortrak placed, TF initiated  Pt remains on CRRT; continues to ambulate regularly. Remains on levophed  ECHO today to reassess pericardial effusion  Pt feels worse today; not wanting to walk. Not wanting to eat. Per RN, pt did not eat breakfast this AM. Not wanting to eat shake brought in my wife. Lunch tray untouched at the moment. Per RN, pt ordered only fruit at dinner last night; RN encouraged pt to order protein with all meal trays. RD also discussed of ordering more  nutritionally complete meals to include all macronutrients.   There have been many issues with meal trays/meal ordering per RN and pt's wife. Examples include being sent bacon at breakfast and being offered pot roast despite being Vegetarian with fish and eggs only; order meals and not receiving what was ordered.  This is definitely of concern given Cortrak should not be removed until po intake is improved/adequate and current situation is def making that difficult. RD spoke with   Tolerating Pivot 1.5 at 65 ml/hr via Cortrak tube. Tube tip is post-pyloric; given this, TF should not contribute to gastric fullness. Plan to continue full feeds at this time.   Pt with pressure injury to sacrum which was not present on admission but is now improving  Phosphorus again low; 1.5 this AM. Sodium phosphate in progress  Labs: phosphorus 1.5  Meds: Renal MVI, aranesp, colace, miralax   Diet Order:   Diet Order             Diet vegetarian Room service appropriate? Yes; Fluid consistency: Thin  Diet effective now                   EDUCATION NEEDS:   Education needs have been addressed  Skin:  Skin Assessment: Reviewed RN Assessment  Last BM:  1/15  Height:   Ht Readings from Last 1 Encounters:  10/02/21 5\' 9"  (1.753 m)    Weight:   Wt Readings from Last 1 Encounters:  10/08/21 70.9 kg     BMI:  Body mass index is 23.08 kg/m.  Estimated Nutritional Needs:  Kcal:  2300-2500 kcals  Protein:  135-160 g  Fluid:  1.8 L  Kerman Passey MS, RDN, LDN, CNSC Registered Dietitian III Clinical Nutrition RD Pager and On-Call Pager Number Located in [place]

## 2021-10-08 NOTE — Progress Notes (Signed)
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 Acute component either from cardiorenal +/- ATN with decreased renal function from hemodynamics and CPB + hypotensive episodes + pressors.  - CRRT started on 1/3.       - back on NE and milrinone, weaning NE as tolerated     - UF as tolerated today- gentle net neg if tolerating    - all 4K, no heparin intracircuit     -Cont monitoring daily I/Os + weights  -Maintain MAP>65 for optimal renal perfusion.  -Avoid nephrotoxic medications including NSAIDs, contrast. - Dose medications for a GFR <15 ml/min and also on CRRT.   Cardiogenic shock: s/p LVAD 09/17/22 (HM 3).   - TTE to assess for RV failure 10/02/21--> RV function down - VAD speed decreased 10/02/21 - on NE and milrinone  - Coox and pressures worse today - echo pending  CAD with h/o PCI last in 2013  Anemia- iron indices 1/16 showing iron sat 12%, ferritin 477, Aranesp 10/06/21. Giving IV iron x 1g over 5 doses.   Afib- on amio  Acute hypoxic RF: s/p thoracentesis 1.3 L 10/02/21  Dispo: ICU, ultimately will need to be able to tolerate iHD, cont CRRT for now given   Subjective:    Co-Ox down, milrinone titrated. BP lower.  Feels fatigued.    Concern for pericardial effusion - echo later today CRRT running fine per RN I/Os yest 2.6 / 4.4.    Objective:   BP (!) 81/71    Pulse (!) 162    Temp 98.3 F (36.8 C) (Oral)    Resp 18    Ht 5\' 9"  (1.753 m)    Wt 70.9 kg    SpO2 92%    BMI 23.08 kg/m   Intake/Output Summary (Last 24 hours) at 10/08/2021 1204 Last data filed at 10/08/2021 1100 Gross per 24 hour  Intake 2143.55 ml  Output 4618 ml  Net -2474.45 ml    Weight change: -0.6 kg  Physical Exam: GEN: NAD, A&Ox3 sitting in bed HEENT: wearing glasses LUNGS: normal WOB, clear ant CV: LVAD  EXT: 1+ lower extremity edema and also present in the UE, much improved, legs wrapped at tibial  level ACCESS: rt IJ TC c/d/i   Imaging: DG CHEST PORT 1 VIEW  Result Date: 10/08/2021 CLINICAL DATA:  Shortness of breath EXAM: PORTABLE CHEST 1 VIEW COMPARISON:  10/04/2021 FINDINGS: Feeding tube tip courses below the level of the diaphragms. There is a right-sided dual lumen catheter with tip terminating in the cavoatrial junction. Left chest wall ICD is noted with leads in the right atrial appendage and right ventricle. LVAD is again noted projecting over the left lower chest. Right arm PICC line tip is in the SVC. Stable cardiomediastinal contours. New veil like opacification of the right lung compatible with pleural effusion. Aeration to the left lung base appears improved from previous exam. IMPRESSION: 1. New veil like opacification of the right lung compatible with pleural effusion. 2. Improved aeration to the left lung base. Electronically Signed   By: Kerby Moors M.D.   On: 10/08/2021 10:09    Labs: BMET Recent Labs  Lab 10/05/21 0408 10/05/21 1620 10/06/21 0258 10/06/21 1435 10/07/21 0411 10/07/21 1626 10/08/21 0316  NA 135 133* 131* 135 135 132* 133*  K 5.0 5.2* 4.9 5.2* 4.8 4.7 4.8  CL 100 98 97* 102 101 97* 100  CO2 27 27 27 26 26 27 26   GLUCOSE 109* 145* 203* 120* 116* 171* 160*  BUN 19 21 22  25* 27* 27* 28*  CREATININE 1.38* 1.42* 1.51* 1.47* 1.57* 1.51* 1.39*  CALCIUM 8.2* 8.0* 7.9* 8.1* 8.1* 7.9* 8.0*  PHOS 1.7*   1.7* 1.3* 1.3* 1.4* 1.9* 3.4 1.5*    CBC Recent Labs  Lab 10/05/21 0408 10/06/21 0258 10/07/21 0814 10/08/21 0316  WBC 13.0* 10.9* 11.1* 12.4*  HGB 8.3* 7.8* 8.5* 8.3*  HCT 26.5* 25.7* 26.5* 26.9*  MCV 93.6 95.2 92.0 94.7  PLT 434* 404* 375 358     Medications:     sodium chloride   Intravenous Once   sodium chloride   Intravenous Once   sodium chloride   Intravenous Once   sodium chloride   Intravenous Once   acetaminophen  500 mg Oral Q6H   amiodarone  200 mg Oral BID   aspirin  81 mg Oral Daily   Chlorhexidine Gluconate Cloth  6  each Topical Daily   darbepoetin (ARANESP) injection - NON-DIALYSIS  60 mcg Subcutaneous Q Mon-1800   docusate sodium  100 mg Oral BID   feeding supplement  237 mL Oral TID BM   mouth rinse  15 mL Mouth Rinse BID   midodrine  20 mg Oral TID WC   montelukast  5 mg Oral QHS   multivitamin  1 tablet Oral QHS   pantoprazole  40 mg Oral Daily   polyethylene glycol  17 g Per Tube Daily   rosuvastatin  10 mg Oral Daily   senna  2 tablet Oral QHS   sodium chloride flush  10-40 mL Intracatheter Q12H   sodium chloride flush  3 mL Intravenous Q12H   warfarin  5 mg Oral ONCE-1600   Warfarin - Pharmacist Dosing Inpatient   Does not apply q1600   Jannifer Hick MD [facility] Pager [phone]

## 2021-10-08 NOTE — Progress Notes (Signed)
LVAD Coordinator Rounding Note:  Admitted 09/06/21 due to CHF exacerbation to Dr Claris Gladden service. He was started on Milrinone on admission. Febrile n 09/10/21- suspected to have UTI- antibiotics started. Impella 5.5 placed 09/12/21.   HM III LVAD implanted on 09/17/21 by Dr Cyndia Bent under destination therapy criteria. 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   Pt lying in bed resting. Reports he has up and walked already this morning and sat in the chair for a while.   CRRT pulling 50 cc/hr based on BP. Continues of Levophed drip.   Tolerating tube feeds via core-track. Appetite is improving.   Plan on bedside echo w/Dr. Aundra Dubin at noon.  Vital signs: Temp: 98.6 HR: 57 AV paced Doppler Pressure: 60 Auto Cuff BP: 92/79 (85) O2 Sat: 94% on 2L [place] Wt: 185.2>196.8>194>189.1>192.2>198.4>189.6>188.7>183.6>177>160.2>157.6 >156.3lbs    LVAD interrogation reveals:  Speed: 5000 Flow: 3.9 Power: 3w PI: 4.1 Hematocrit: 26  Alarms: none Events: 60+ today  Fixed speed: 5000 Low speed limit: 4700  Drive Line:  Existing VAD dressing removed and site care performed using sterile technique. Drive line exit site cleaned with Chlora prep applicators x 2, allowed to dry, and gauze dressing with silver strip applied. Exit site not incorporated, the velour is fully implanted at exit site. 1 suture in place. Scant amount of dried tissue fat necrosis drainage noted at site and on gauze. Slight redness, no tenderness, foul odor or rash noted. Drive line anchor secure. Will continue M/W/F dressing changes per Nurse Davonna Belling, [place] coordinator, or trained caregiver.  Next dressing change due: 10/10/21.      Labs:  LDH trend: 344>379>370>378>380>331>296>281>291>293>295>270>277>264  INR trend: 1.6>1.8>2.8>2.9>2.1>2.0>2.2>2.4>2.4>2.5>2.3>1.4>1.5  WBC trend: 14.1>13.6>13.1>11>8.7>10.1>13.5>14>14.2>14.3>13.2>10.9>11.1>12.4  Cr  trend: 2.11>2.39>5.24>3.45>2.38>1.97>1.87>1.68>1.73>1.60>1.49>1.52>1.51>1.57>1.39  Hgb trend: 6.5>7.5>8.1>8.8>7.7>8.6>7.7>7.8>8.5>8.3  Anticoagulation Plan: -INR Goal: 2.0 - 2.5 -ASA Dose: 81 mg daily   Blood Products:  Intra Op: 09/17/21  1 PLT  4 FFP   4 PRBC  Cellsaver- 803 cc Post Op:  - 09/17/21>>1 PRBC - 09/18/21>> 3 PRBCs - 09/19/21>> 1 PRBC -  09/26/21>> 1 PRBC - 10/02/21>>1 PRBC - 10/06/21 >> 1 PRBC  Device: - [employer] dual -Therapies: OFF  *Will need to be referred to device clinic at discharge*  Arrythmias: 3 episodes of VT that required defib while in OR 09/17/21. Currently on Amiodarone gtt. Remains in afib.   Respiratory: extubated 09/18/22  Nitric Oxide:  off 09/18/22  Renal:  09/23/21>>started CVVH  Infection:  09/10/21>> blood cultures >> No growth 5 days; final 09/12/21>> urine culture >> No growth; final  09/25/21>>blood cultures>>negative  09/27/21>>pleural effusion>>negative, final 10/02/21>> pleural effusion >> no growth; final   Drips:  Milrinone 0.351mcg/kg/min (restarted 10/02/21) Levo 2 mcg/min Tube feed 65 cc/hr Heparin 500 units/hr  Patient Education: No family at bedside. Patient tired and in bed. Not appropriate for teaching at this time  Plan/Recommendations:  1. [place] VAD coordinator for drive line or equipment concerns 2. Daily drive line dressing change per Nurse Davonna Belling or [place] coordinator  [name] RN [place] Coordinator  Office: [phone]  24/7 Pager: [phone]

## 2021-10-09 DIAGNOSIS — Z95811 Presence of heart assist device: Secondary | ICD-10-CM | POA: Diagnosis not present

## 2021-10-09 DIAGNOSIS — I5043 Acute on chronic combined systolic (congestive) and diastolic (congestive) heart failure: Secondary | ICD-10-CM | POA: Diagnosis not present

## 2021-10-09 DIAGNOSIS — R570 Cardiogenic shock: Secondary | ICD-10-CM | POA: Diagnosis not present

## 2021-10-09 LAB — COOXEMETRY PANEL
Carboxyhemoglobin: 1.7 % — ABNORMAL HIGH (ref 0.5–1.5)
Carboxyhemoglobin: 2 % — ABNORMAL HIGH (ref 0.5–1.5)
Carboxyhemoglobin: 2.1 % — ABNORMAL HIGH (ref 0.5–1.5)
Methemoglobin: 0.9 % (ref 0.0–1.5)
Methemoglobin: 1 % (ref 0.0–1.5)
Methemoglobin: 0.9 % (ref 0.0–1.5)
O2 Saturation: 50.1 %
O2 Saturation: 55.1 %
O2 Saturation: 54.8 %
Total hemoglobin: 8.8 g/dL — ABNORMAL LOW (ref 12.0–16.0)
Total hemoglobin: 8.1 g/dL — ABNORMAL LOW (ref 12.0–16.0)
Total hemoglobin: 8.3 g/dL — ABNORMAL LOW (ref 12.0–16.0)

## 2021-10-09 LAB — GLUCOSE, CAPILLARY
Glucose-Capillary: 110 mg/dL — ABNORMAL HIGH (ref 70–99)
Glucose-Capillary: 104 mg/dL — ABNORMAL HIGH (ref 70–99)
Glucose-Capillary: 106 mg/dL — ABNORMAL HIGH (ref 70–99)
Glucose-Capillary: 110 mg/dL — ABNORMAL HIGH (ref 70–99)
Glucose-Capillary: 117 mg/dL — ABNORMAL HIGH (ref 70–99)

## 2021-10-09 LAB — RENAL FUNCTION PANEL
Albumin: 2 g/dL — ABNORMAL LOW (ref 3.5–5.0)
Albumin: 1.9 g/dL — ABNORMAL LOW (ref 3.5–5.0)
Anion gap: 10 (ref 5–15)
Anion gap: 10 (ref 5–15)
BUN: 30 mg/dL — ABNORMAL HIGH (ref 8–23)
BUN: 33 mg/dL — ABNORMAL HIGH (ref 8–23)
CO2: 27 mmol/L (ref 22–32)
CO2: 26 mmol/L (ref 22–32)
Calcium: 8.6 mg/dL — ABNORMAL LOW (ref 8.9–10.3)
Calcium: 7.5 mg/dL — ABNORMAL LOW (ref 8.9–10.3)
Chloride: 100 mmol/L (ref 98–111)
Chloride: 101 mmol/L (ref 98–111)
Creatinine, Ser: 1.44 mg/dL — ABNORMAL HIGH (ref 0.61–1.24)
Creatinine, Ser: 1.53 mg/dL — ABNORMAL HIGH (ref 0.61–1.24)
GFR, Estimated: 53 mL/min — ABNORMAL LOW (ref >60)
GFR, Estimated: 49 mL/min — ABNORMAL LOW (ref >60)
Glucose, Bld: 109 mg/dL — ABNORMAL HIGH (ref 70–99)
Glucose, Bld: 106 mg/dL — ABNORMAL HIGH (ref 70–99)
Phosphorus: 1.7 mg/dL — ABNORMAL LOW (ref 2.5–4.6)
Phosphorus: 4.1 mg/dL (ref 2.5–4.6)
Potassium: 5.3 mmol/L — ABNORMAL HIGH (ref 3.5–5.1)
Potassium: 4.7 mmol/L (ref 3.5–5.1)
Sodium: 137 mmol/L (ref 135–145)
Sodium: 137 mmol/L (ref 135–145)

## 2021-10-09 LAB — CBC
HCT: 28.2 % — ABNORMAL LOW (ref 39.0–52.0)
Hemoglobin: 8.9 g/dL — ABNORMAL LOW (ref 13.0–17.0)
MCH: 29.3 pg (ref 26.0–34.0)
MCHC: 31.6 g/dL (ref 30.0–36.0)
MCV: 92.8 fL (ref 80.0–100.0)
Platelets: 371 10*3/uL (ref 150–400)
RBC: 3.04 MIL/uL — ABNORMAL LOW (ref 4.22–5.81)
RDW: 21.3 % — ABNORMAL HIGH (ref 11.5–15.5)
WBC: 12.8 10*3/uL — ABNORMAL HIGH (ref 4.0–10.5)
nRBC: 0.2 % (ref 0.0–0.2)

## 2021-10-09 LAB — MAGNESIUM: Magnesium: 2.4 mg/dL (ref 1.7–2.4)

## 2021-10-09 LAB — LACTATE DEHYDROGENASE: LDH: 299 U/L — ABNORMAL HIGH (ref 98–192)

## 2021-10-09 LAB — PROTIME-INR
INR: 2.1 — ABNORMAL HIGH (ref 0.8–1.2)
Prothrombin Time: 23.9 seconds — ABNORMAL HIGH (ref 11.4–15.2)

## 2021-10-09 LAB — HEPARIN LEVEL (UNFRACTIONATED): Heparin Unfractionated: <0.1 IU/mL — ABNORMAL LOW (ref 0.30–0.70)

## 2021-10-09 MED ORDER — WARFARIN SODIUM 1 MG PO TABS: 1.0000 mg | ORAL_TABLET | Freq: Once | ORAL | Status: DC

## 2021-10-09 MED ORDER — TRAMADOL HCL 50 MG PO TABS: 50.0000 mg | ORAL_TABLET | Freq: Two times a day (BID) | ORAL | Status: DC | PRN

## 2021-10-09 MED ORDER — SODIUM PHOSPHATES 45 MMOLE/15ML IV SOLN
30.0000 mmol | Freq: Once | INTRAVENOUS | Status: AC
  Administered 2021-10-09: 30 mmol via INTRAVENOUS
  Filled 2021-10-09: qty 10

## 2021-10-09 MED ORDER — WARFARIN 0.5 MG HALF TABLET
0.5000 mg | ORAL_TABLET | Freq: Once | ORAL | Status: AC
  Administered 2021-10-09: 0.5 mg via ORAL
  Filled 2021-10-09: qty 1

## 2021-10-09 MED ORDER — MIRTAZAPINE 7.5 MG PO TABS
7.5000 mg | ORAL_TABLET | Freq: Every day | ORAL | Status: DC
  Administered 2021-10-09 – 2021-10-23 (×15): 7.5 mg via ORAL
  Filled 2021-10-09 (×15): qty 1

## 2021-10-09 NOTE — Progress Notes (Signed)
LVAD Coordinator Rounding Note:  Admitted 09/06/21 due to CHF exacerbation to Dr Claris Gladden service. He was started on Milrinone on admission. Febrile n 09/10/21- suspected to have UTI- antibiotics started. Impella 5.5 placed 09/12/21.   HM III LVAD implanted on 09/17/21 by Dr Cyndia Bent under destination therapy criteria. 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   Pt lying in bed resting. Reports he has walked already this morning. Pt is in good spirits today.  CRRT running even today. Continues on Levophed drip.   Pt really wants Cortrack out. Will give a trial of Cortrack out today, he knows he has to have good po intake to keep out. Remeron started for appetite.    Vital signs: Temp: 98.3 HR: 60 AV paced Doppler Pressure: 70 Auto Cuff BP: 88/70 (77) O2 Sat: 94% on 2L [place] Wt: 185.2>196.8>194>189.1>192.2>198.4>189.6>188.7>183.6>177>160.2>157.6 >156.3>154.3 lbs    LVAD interrogation reveals:  Speed: 4900 Flow: 3.7 Power: 3.2w PI: 4.2 Hematocrit: 28  Alarms: none Events: 6 today  Fixed speed: 4900 Low speed limit: 4600  Drive Line:  CDI.  Drive line anchor secure. Will continue M/W/F dressing changes per Nurse Davonna Belling, [place] coordinator, or trained caregiver.  Next dressing change due: 10/10/21.    Labs:  LDH trend: 344>379>370>378>380>331>296>281>291>293>295>270>277>264>299  INR trend: 1.6>1.8>2.8>2.9>2.1>2.0>2.2>2.4>2.4>2.5>2.3>1.4>1.5>2.1  WBC trend: 14.1>13.6>13.1>11>8.7>10.1>13.5>14>14.2>14.3>13.2>10.9>11.1>12.4>12.8  Cr trend: 2.11>2.39>5.24>3.45>2.38>1.97>1.87>1.68>1.73>1.60>1.49>1.52>1.51>1.57>1.39>1.44  Hgb trend: 6.5>7.5>8.1>8.8>7.7>8.6>7.7>7.8>8.5>8.3>8.9  Anticoagulation Plan: -INR Goal: 2.0 - 2.5 -ASA Dose: 81 mg daily   Blood Products:  Intra Op: 09/17/21  1 PLT  4 FFP   4 PRBC  Cellsaver- 803 cc Post Op:  - 09/17/21>>1 PRBC - 09/18/21>> 3 PRBCs - 09/19/21>> 1 PRBC -   09/26/21>> 1 PRBC - 10/02/21>>1 PRBC - 10/06/21 >> 1 PRBC  Device: - [employer] dual -Therapies: OFF  *Will need to be referred to device clinic at discharge*  Arrythmias: 3 episodes of VT that required defib while in OR 09/17/21. Currently on Amiodarone gtt. Remains in afib.   Respiratory: extubated 09/18/22  Nitric Oxide:  off 09/18/22  Renal:  09/23/21>>started CVVH  Infection:  09/10/21>> blood cultures >> No growth 5 days; final 09/12/21>> urine culture >> No growth; final  09/25/21>>blood cultures>>negative  09/27/21>>pleural effusion>>negative, final 10/02/21>> pleural effusion >> no growth; final   Drips:  Milrinone 0.375mcg/kg/min (restarted 10/02/21) Levo 1 mcg/min Tube feed 65 cc/hr Heparin 500 units/hr-off today  Patient Education: No family at bedside. Patient tired and in bed. Not appropriate for teaching at this time  Plan/Recommendations:  1. [place] VAD coordinator for drive line or equipment concerns 2. Daily drive line dressing change per Nurse Davonna Belling or [place] coordinator  [name] RN [place] Coordinator  Office: [phone]  24/7 Pager: [phone]

## 2021-10-09 NOTE — Progress Notes (Signed)
[place] for Warfarin / Heparin Indication: LVAD/AF  Allergies  Allergen Reactions   Mushroom Extract Complex Nausea And Vomiting   Neosporin [Neomycin-Bacitracin Zn-Polymyx] Hives    Patient Measurements: Height: 5\' 9"  (175.3 cm) Weight: 70 kg (154 lb 5.2 oz) (weighed with system controller, batteries NOT weighed) IBW/kg (Calculated) : 70.7 Heparin Dosing Weight: 83.7 kg  Vital Signs: Temp: 98.6 F (37 C) (01/19 1100) Temp Source: Oral (01/19 1100) BP: 90/78 (01/19 1500) Pulse Rate: 72 (01/19 1400)  Labs: Recent Labs    10/07/21 0814 10/07/21 1626 10/08/21 0316 10/08/21 0542 10/08/21 1658 10/09/21 0504  HGB 8.5*  --  8.3*  --   --  8.9*  HCT 26.5*  --  26.9*  --   --  28.2*  PLT 375  --  358  --   --  371  LABPROT  --   --  18.4* 18.3*  --  23.9*  INR  --   --  1.5* 1.5*  --  2.1*  HEPARINUNFRC  --   --  0.17* <0.10*  --  <0.10*  CREATININE  --    < > 1.39*  --  1.37* 1.44*   < > = values in this interval not displayed.     Estimated Creatinine Clearance: 47.9 mL/min (A) (by C-G formula based on SCr of 1.44 mg/dL (H)).   Medical History: Past Medical History:  Diagnosis Date   Arrhythmia    atrial fibrillation   CHF (congestive heart failure) (HCC)    Chronic kidney disease    Coronary artery disease    Hyperlipidemia    Hypertension    Myocardial infarct [facility])     Assessment: 71 yo male on chronic Eliquis PTA for afib.  Now s/p LVAD placement 09/17/21, pharmacy asked to begin anticoagulation with warfarin.   INR jumped to 2.1 after boost doses Patient previously therapeutic on lower doses 1-2mg  daily prior to tube feed being started for poor nutritional intake. Cautiously increase warfarin doses though because patient wants TF removed  - done 1/19  Poor oral intake encouraging ensure drinks today Vit K intake will drop   Low fixed rate heparin drip 500 uts/hr started as bridge for low INR - stop 1/19 INR >  1.8  Heparin level is undetectable - as expected.  Hgb stable at 8, plt stable 300s and LDH stable in 200s. No s/sx of bleeding or infusion issues.     Goal of Therapy:  INR 2-2.5 Monitor platelets by anticoagulation protocol: Yes   Plan:  -Warfarin 0.5 mg PO x1 today Stop heparin -Daily INR, CBC, LDH,   Bonnita Nasuti Pharm.D. CPP, BCPS Clinical Pharmacist [phone] 10/09/2021 3:43 PM    Please check AMION for all [place] phone numbers After 10:00 PM, call [place] [phone]

## 2021-10-09 NOTE — Progress Notes (Signed)
Occupational Therapy Treatment Patient Details Name: Isaac [name] MRN: [id number] DOB: 1951/04/03 Today's Date: 10/09/2021   History of present illness Pt is a 71 y.o. male admitted 09/07/21 with near-syncope, nausea, poor oral intake, generalized weakness. Workup for advanced heart failure and persistent inotropic with requirement, cardiogenic shock. [place] 12/19. Impella placed on 12/23. LVAD placed 12/28. Pt with post-op delirium and agitation. CRRT initiated 1/3. S/p thoracentesis 1/12. S/p cortrak placement 1/13. PMH includes advanced CHF (LVEF <20%), AICD/PPM, PAF on Eliquis, CKD 3, HTN, HLD.   OT comments  Pt progressing towards established OT goals. Pt switching to battery with Mod cues. Pt performing functional mobility in hallway with eva walker and Min Guard A. Pt performing toileting with Min A and providing education on compensatory techniques for peri care to adhere to sternal precautions. Continue to recommend dc to CIR and will continue to follow acutely as admitted.    Recommendations for follow up therapy are one component of a multi-disciplinary discharge planning process, led by the attending physician.  Recommendations may be updated based on patient status, additional functional criteria and insurance authorization.    Follow Up Recommendations  Acute inpatient rehab (3hours/day)    Assistance Recommended at Discharge Frequent or constant Supervision/Assistance  Patient can return home with the following  A little help with walking and/or transfers;A little help with bathing/dressing/bathroom;Direct supervision/assist for medications management;Direct supervision/assist for financial management;Assistance with cooking/housework   Equipment Recommendations  BSC/3in1    Recommendations for Other Services PT consult    Precautions / Restrictions Precautions Precautions: Fall;Sternal;Other (comment) Precaution Comments: LVAD, CRRT Restrictions Other Position/Activity  Restrictions: Sternal Precautions       Mobility Bed Mobility Overal bed mobility: Needs Assistance Bed Mobility: Rolling, Sidelying to Sit Rolling: Min guard Sidelying to sit: Min guard       General bed mobility comments: Min guard A for safety    Transfers Overall transfer level: Needs assistance Equipment used:  (eva walker) Transfers: Sit to/from Stand Sit to Stand: Min assist           General transfer comment: Min A to power up     Balance Overall balance assessment: Needs assistance Sitting-balance support: No upper extremity supported, Feet supported Sitting balance-Leahy Scale: Fair     Standing balance support: No upper extremity supported, During functional activity Standing balance-Leahy Scale: Fair                             ADL either performed or assessed with clinical judgement   ADL Overall ADL's : Needs assistance/impaired                         Toilet Transfer: Minimal assistance;Ambulation;Regular Glass blower/designer Details (indicate cue type and reason): Min A for power up Toileting- Clothing Manipulation and Hygiene: Minimal assistance;Sit to/from stand Toileting - Clothing Manipulation Details (indicate cue type and reason): Cues for techniques to adhere to sternal precautions. Pt performing with Min A to make sure everything is clean     Functional mobility during ADLs: Min guard (eva walker) General ADL Comments: Pt switching to batteries, performing functional mobility in hallway, and completing toileting. Requiring increased time for processing    Extremity/Trunk Assessment Upper Extremity Assessment Upper Extremity Assessment: Generalized weakness   Lower Extremity Assessment Lower Extremity Assessment: Defer to PT evaluation        Vision       Perception  Praxis      Cognition Arousal/Alertness: Awake/alert Behavior During Therapy: Flat affect Overall Cognitive Status:  Impaired/Different from baseline Area of Impairment: Memory, Problem solving                     Memory: Decreased short-term memory       Problem Solving: Slow processing, Requires verbal cues          Exercises      Shoulder Instructions       General Comments VSS on 2L    Pertinent Vitals/ Pain       Pain Assessment Pain Assessment: No/denies pain  Home Living                                          Prior Functioning/Environment              Frequency  Min 2X/week        Progress Toward Goals  OT Goals(current goals can now be found in the care plan section)  Progress towards OT goals: Progressing toward goals  Acute Rehab OT Goals OT Goal Formulation: With patient Time For Goal Achievement: 10/21/21 Potential to Achieve Goals: Good ADL Goals Pt Will Perform Lower Body Bathing: with modified independence;sit to/from stand Pt Will Perform Lower Body Dressing: with modified independence;sit to/from stand Pt Will Transfer to Toilet: with modified independence;ambulating Additional ADL Goal #1: Pt will tolerate at least 8 minutes of OOB functional task without reast break to incrased activity tolerance Additional ADL Goal #2: pt will indep verbalize at least 2 energy conservation strategies to apply at d/c. Additional ADL Goal #3: Pt will manage LVAD equipment independently.  Plan Discharge plan remains appropriate    Co-evaluation    PT/OT/SLP Co-Evaluation/Treatment: Yes Reason for Co-Treatment: For patient/therapist safety;To address functional/ADL transfers   OT goals addressed during session: ADL's and self-care      AM-PAC OT "6 Clicks" Daily Activity     Outcome Measure   Help from another person eating meals?: None Help from another person taking care of personal grooming?: A Little Help from another person toileting, which includes using toliet, bedpan, or urinal?: A Little Help from another person bathing  (including washing, rinsing, drying)?: A Lot Help from another person to put on and taking off regular upper body clothing?: A Lot Help from another person to put on and taking off regular lower body clothing?: A Lot 6 Click Score: 16    End of Session Equipment Utilized During Treatment: Oxygen  OT Visit Diagnosis: Unsteadiness on feet (R26.81);Muscle weakness (generalized) (M62.81);Other symptoms and signs involving cognitive function;Other (comment);Pain   Activity Tolerance Patient tolerated treatment well   Patient Left in bed;with call bell/phone within reach   Nurse Communication Mobility status        Time: 5885-0277 OT Time Calculation (min): 37 min  Charges: OT General Charges $OT Visit: 1 Visit OT Treatments $Self Care/Home Management : 8-22 mins $Therapeutic Activity: 8-22 mins  [place], OTR/L Acute Rehab Pager: [phone] Office: [phone]  [place] Footman Romone Shaff 10/09/2021, 5:02 PM

## 2021-10-09 NOTE — Progress Notes (Signed)
OT Cancellation Note  Patient Details Name: Isaac [name] MRN: [id number] DOB: 05-05-1951   Cancelled Treatment:    Reason Eval/Treat Not Completed: Other (comment) (RN reports he recent took a walk with nursing staff this AM. Request return for therapy in the PM as he needs more time on CRRT. Will return as schedule allows. Thank you.)  [place], OTR/L Acute Rehab Pager: [phone] Office: [phone] 10/09/2021, 9:28 AM

## 2021-10-09 NOTE — Progress Notes (Addendum)
Patient ID: Isaac [name], male   DOB: 1951/07/08, 71 y.o.   MRN: [id number]    Advanced Heart Failure VAD Team Note  PCP-Cardiologist: None   Subjective:    12/19 RHC- RA 7, PA 39/14 (25), PCWP 11, CO 6.4 CI 3. Thermo 3.5 1.8. Lasix drip stopped after cath. 12/20 Swan removed.  Norepi 3 mcg added.  12/21 Fever. Blood CX drawn. UA + leukocytes. Started on vanc and cefepime. Diuresed with IV lasix + metolazone.  12/23 Impella 5.5 placed 12/28 HM III LVAD, ICD leads plastered to tricuspid valve with severe TR, valve not replaced and some improvement in TR with LVAD placement.  12/29 Extubated 01/03 Started CRRT 01/07  L thoracentesis and placement of [facility] 01/09 LVAD speed increased to 5600 rpm 01/10 Milrinone stopped. Amio gtt>>PO. Completed 5 day course Cefepime.  01/12 Ramp echo done: Moderate lateral pericardial effusion w/o tamponade, RV larger with severe dilation/moderate dysfunction and left shift of the septum, LV small and undershifted.  TR severe.  LVAD speed decreased to 5000 rpms at 2 different sessions.  Also had thoracentesis on left with 1.3 L off.  01/13 TFs begun 01/16 echo reviewed: EF 25-30%, LV larger than prior but septum still with left shift, RV moderately dilated and moderately dysfunctional, moderate-severe TR, mod-large localized pericardial effusion lateral to the LV.  1/18 Ramp Echo with speed decreased to 4900   He remains on milrinone 0.375 + NE 2. CO-OX 55%.    CVVH ongoing. Weight down another 2 pounds    Remains on tube feeds.   LDH 264>299 INR 2.1   Hgb 8.9  Feels ok today. Had BM this morning. Appetite remains poor.   LVAD INTERROGATION:  HeartMate III LVAD:   Flow 4.1  liters/min, speed 4900, power 3 PI  3.9 .    < 10 PI events. VAD interrogated personally.      Objective:    Vital Signs:   Temp:  [97.7 F (36.5 C)-98.9 F (37.2 C)] 98.3 F (36.8 C) (01/19 0700) Pulse Rate:  [30-162] 71 (01/19 0645) Resp:  [12-32] 24 (01/19  0700) BP: (68-113)/(22-97) 78/61 (01/19 0630) SpO2:  [66 %-100 %] 97 % (01/19 0645) Weight:  [70 kg] 70 kg (01/19 0500) Last BM Date: 10/08/21 Mean arterial Pressure 70s    Intake/Output:   Intake/Output Summary (Last 24 hours) at 10/09/2021 0822 Last data filed at 10/09/2021 0724 Gross per 24 hour  Intake 2732.31 ml  Output 2828 ml  Net -95.69 ml     Physical Exam  CVP 8  Physical Exam: GENERAL: No acute distress. HEENT: + [place] NECK: Supple, JVP 6-7 .  2+ bilaterally, no bruits.  No lymphadenopathy or thyromegaly appreciated.  RIJ HD catheter CARDIAC:  Mechanical heart sounds with LVAD hum present.  LUNGS:  Clear to auscultation bilaterally.  ABDOMEN:  Soft, round, nontender, positive bowel sounds x4.     LVAD exit site:   Dressing dry and intact.  No erythema or drainage.  Stabilization device present and accurately applied.   EXTREMITIES:  Warm and dry, no cyanosis, clubbing, rash or edema  NEUROLOGIC:  Alert and oriented x 3.    No aphasia.  No dysarthria.  Affect flat   Telemetry  A-V 60-70s  Labs   Basic Metabolic Panel: Recent Labs  Lab 10/05/21 0408 10/05/21 1620 10/06/21 0258 10/06/21 1435 10/07/21 0411 10/07/21 1626 10/08/21 0316 10/08/21 1658 10/09/21 0504  NA 135   < > 131*   < > 135 132* 133*  135 137  K 5.0   < > 4.9   < > 4.8 4.7 4.8 4.8 5.3*  CL 100   < > 97*   < > 101 97* 100 98 100  CO2 27   < > 27   < > 26 27 26 27 27   GLUCOSE 109*   < > 203*   < > 116* 171* 160* 130* 109*  BUN 19   < > 22   < > 27* 27* 28* 27* 30*  CREATININE 1.38*   < > 1.51*   < > 1.57* 1.51* 1.39* 1.37* 1.44*  CALCIUM 8.2*   < > 7.9*   < > 8.1* 7.9* 8.0* 8.0* 8.6*  MG 2.3  --  2.4  --  2.2  --  2.3  --  2.4  PHOS 1.7*   1.7*   < > 1.3*   < > 1.9* 3.4 1.5* 2.1* 1.7*   < > = values in this interval not displayed.    Liver Function Tests: Recent Labs  Lab 10/07/21 0411 10/07/21 1626 10/08/21 0316 10/08/21 1658 10/09/21 0504  ALBUMIN 2.0* 1.9* 2.0* 2.0* 2.0*    No results for input(s): LIPASE, AMYLASE in the last 168 hours. No results for input(s): AMMONIA in the last 168 hours.   CBC: Recent Labs  Lab 10/05/21 0408 10/06/21 0258 10/07/21 0814 10/08/21 0316 10/09/21 0504  WBC 13.0* 10.9* 11.1* 12.4* 12.8*  HGB 8.3* 7.8* 8.5* 8.3* 8.9*  HCT 26.5* 25.7* 26.5* 26.9* 28.2*  MCV 93.6 95.2 92.0 94.7 92.8  PLT 434* 404* 375 358 371    INR: Recent Labs  Lab 10/06/21 0258 10/07/21 0411 10/08/21 0316 10/08/21 0542 10/09/21 0504  INR 1.4* 1.4* 1.5* 1.5* 2.1*    Other results:   Imaging   DG CHEST PORT 1 VIEW  Result Date: 10/08/2021 CLINICAL DATA:  Shortness of breath EXAM: PORTABLE CHEST 1 VIEW COMPARISON:  10/04/2021 FINDINGS: Feeding tube tip courses below the level of the diaphragms. There is a right-sided dual lumen catheter with tip terminating in the cavoatrial junction. Left chest wall ICD is noted with leads in the right atrial appendage and right ventricle. LVAD is again noted projecting over the left lower chest. Right arm PICC line tip is in the SVC. Stable cardiomediastinal contours. New veil like opacification of the right lung compatible with pleural effusion. Aeration to the left lung base appears improved from previous exam. IMPRESSION: 1. New veil like opacification of the right lung compatible with pleural effusion. 2. Improved aeration to the left lung base. Electronically Signed   By: Kerby Moors M.D.   On: 10/08/2021 10:09   ECHOCARDIOGRAM COMPLETE  Result Date: 10/08/2021    ECHOCARDIOGRAM REPORT   Patient Name:   Isaac [name] Date of Exam: 10/08/2021 Medical Rec #:  [id number]           Height:       69.0 in Accession #:    [id number]          Weight:       156.3 lb Date of Birth:  [birth year]            BSA:          1.861 m Patient Age:    14 years            BP:           92/79 mmHg Patient Gender: M  HR:           60 bpm. Exam Location:  Inpatient Procedure: 2D Echo, Cardiac Doppler and  Color Doppler Indications:    LVAD, CHF  History:        Patient has prior history of Echocardiogram examinations. CAD;                 Arrythmias:Atrial Fibrillation.  Sonographer:    Jyl Heinz Referring Phys: [phone] AMY D CLEGG IMPRESSIONS  1. LVAD inflow cannula at apex     4900 RPM, aortic valve does not open. RV moderately dilated     4800 RPM, aortic valve opens intermittently. RV moderately dilated. Left ventricular ejection fraction, by estimation, is 20 to 25%. The left ventricle has severely decreased function. The left ventricle has no regional wall motion abnormalities. There is mild concentric left ventricular hypertrophy. Left ventricular diastolic parameters are consistent with Grade II diastolic dysfunction (pseudonormalization). Elevated left ventricular end-diastolic pressure.  2. Right ventricular systolic function is normal. The right ventricular size is moderately enlarged. There is normal pulmonary artery systolic pressure.  3. Pericardial effusion slightly smaller compared with the echo 10/06/21. a small pericardial effusion is present. The pericardial effusion is posterior to the left ventricle.  4. The mitral valve is normal in structure. No evidence of mitral valve regurgitation. No evidence of mitral stenosis.  5. Tricuspid valve regurgitation is moderate.  6. The aortic valve is tricuspid. Aortic valve regurgitation is trivial. No aortic stenosis is present.  7. Aortic dilatation noted. There is mild dilatation of the ascending aorta, measuring 39 mm.  8. The inferior vena cava is dilated in size with <50% respiratory variability, suggesting right atrial pressure of 15 mmHg. FINDINGS  Left Ventricle: LVAD inflow cannula at apex 4900 RPM, aortic valve does not open. RV moderately dilated 4800 RPM, aortic valve opens intermittently. RV moderately dilated. Left ventricular ejection fraction, by estimation, is 20 to 25%. The left ventricle has severely decreased function. The left ventricle  has no regional wall motion abnormalities. The left ventricular internal cavity size was normal in size. There is mild concentric left ventricular hypertrophy. Left ventricular diastolic parameters are consistent with Grade II diastolic dysfunction (pseudonormalization). Elevated left ventricular end-diastolic pressure. Right Ventricle: The right ventricular size is moderately enlarged. No increase in right ventricular wall thickness. Right ventricular systolic function is normal. There is normal pulmonary artery systolic pressure. The tricuspid regurgitant velocity is 2.25 m/s, and with an assumed right atrial pressure of 15 mmHg, the estimated right ventricular systolic pressure is 70.3 mmHg. Left Atrium: Left atrial size was normal in size. Right Atrium: Right atrial size was normal in size. Pericardium: Pericardial effusion slightly smaller compared with the echo 10/06/21. A small pericardial effusion is present. The pericardial effusion is posterior to the left ventricle. Mitral Valve: The mitral valve is normal in structure. No evidence of mitral valve regurgitation. No evidence of mitral valve stenosis. Tricuspid Valve: The tricuspid valve is normal in structure. Tricuspid valve regurgitation is moderate . No evidence of tricuspid stenosis. Aortic Valve: The aortic valve is tricuspid. Aortic valve regurgitation is trivial. No aortic stenosis is present. Pulmonic Valve: The pulmonic valve was normal in structure. Pulmonic valve regurgitation is mild. No evidence of pulmonic stenosis. Aorta: Aortic dilatation noted. There is mild dilatation of the ascending aorta, measuring 39 mm. Venous: The inferior vena cava is dilated in size with less than 50% respiratory variability, suggesting right atrial pressure of 15 mmHg. IAS/Shunts: No atrial level shunt  detected by color flow Doppler.  LEFT VENTRICLE PLAX 2D LVIDd:         4.20 cm   Diastology LVIDs:         3.30 cm   LV e' medial:    5.43 cm/s LV PW:         1.20  cm   LV E/e' medial:  12.9 LV IVS:        1.10 cm   LV e' lateral:   4.56 cm/s LVOT diam:     2.20 cm   LV E/e' lateral: 15.3 LVOT Area:     3.80 cm  RIGHT VENTRICLE            IVC RV Basal diam:  3.80 cm    IVC diam: 2.30 cm RV Mid diam:    3.90 cm RV S prime:     5.87 cm/s TAPSE (M-mode): 0.9 cm LEFT ATRIUM           Index        RIGHT ATRIUM           Index LA diam:      3.90 cm 2.10 cm/m   RA Area:     19.00 cm LA Vol (A4C): 34.9 ml 18.76 ml/m  RA Volume:   52.30 ml  28.11 ml/m                        PULMONIC VALVE AORTA                 PR End Diast Vel: 1.62 msec Ao Root diam: 3.60 cm Ao Asc diam:  3.90 cm MITRAL VALVE               TRICUSPID VALVE MV Area (PHT): 4.52 cm    TR Peak grad:   20.2 mmHg MV Decel Time: 168 msec    TR Vmax:        225.00 cm/s MV E velocity: 69.80 cm/s MV A velocity: 46.60 cm/s  SHUNTS MV E/A ratio:  1.50        Systemic Diam: 2.20 cm Skeet Latch MD Electronically signed by Skeet Latch MD Signature Date/Time: 10/08/2021/6:15:39 PM    Final      Medications:     Scheduled Medications:  sodium chloride   Intravenous Once   sodium chloride   Intravenous Once   sodium chloride   Intravenous Once   sodium chloride   Intravenous Once   acetaminophen  500 mg Oral Q6H   amiodarone  200 mg Oral BID   aspirin  81 mg Oral Daily   Chlorhexidine Gluconate Cloth  6 each Topical Daily   darbepoetin (ARANESP) injection - NON-DIALYSIS  60 mcg Subcutaneous Q Mon-1800   docusate sodium  100 mg Oral BID   feeding supplement  237 mL Oral TID BM   mouth rinse  15 mL Mouth Rinse BID   midodrine  20 mg Oral TID WC   montelukast  5 mg Oral QHS   multivitamin  1 tablet Oral QHS   pantoprazole  40 mg Oral Daily   polyethylene glycol  17 g Per Tube Daily   rosuvastatin  10 mg Oral Daily   senna  2 tablet Oral QHS   sodium chloride flush  10-40 mL Intracatheter Q12H   sodium chloride flush  3 mL Intravenous Q12H   Warfarin - Pharmacist Dosing Inpatient   Does not apply  q1600    Infusions:  prismasol BGK 4/2.5 400 mL/hr at 10/09/21 0100    prismasol BGK 4/2.5 200 mL/hr at 10/08/21 1056   sodium chloride Stopped (09/20/21 0304)   sodium chloride Stopped (09/18/21 0803)   sodium chloride Stopped (10/02/21 0900)   feeding supplement (PIVOT 1.5 CAL) 1,000 mL (10/08/21 1616)   iron sucrose Stopped (10/08/21 1001)   milrinone 0.375 mcg/kg/min (10/09/21 0723)   norepinephrine (LEVOPHED) Adult infusion 2 mcg/min (10/09/21 0700)   prismasol BGK 4/2.5 1,800 mL/hr at 10/09/21 0500   promethazine (PHENERGAN) injection (IM or IVPB) Stopped (10/01/21 1714)    PRN Medications: sodium chloride, sodium chloride, albuterol, heparin, heparin, lip balm, ondansetron (ZOFRAN) IV, oxyCODONE, polyethylene glycol, promethazine (PHENERGAN) injection (IM or IVPB), sodium chloride flush, sodium chloride flush, traMADol   Assessment/Plan:    1. Acute on chronic systolic CHF:  Long-standing cardiomyopathy.  [place].  Echo this admission with EF < 20%, severe LV dilation, restrictive diastolic function, moderate RV dysfunction, moderate MR, mod-severe TR. Cause of cardiomyopathy is uncertain.  He has a history of CAD, but I do not think that the described CAD from the past could explain his cardiomyopathy, but CAD could have progressed.  With difficulty tolerating GDMT/need for midodrine and cardiorenal syndrome as well as profound volume overload + NYHA class IV symptoms,  concerned for low output HF. Co-ox off milrinone was 36%, milrinone started and increased to 0.375 but CO remained low. NE added and Impella 5.5 placed 12/23. EF 10% on TEE 12/23. s/p HM III VAD on 12/28.  Ramp echo 01/03 with EF 20-25%, RV moderately reduced, septum mid-line.  Ramp echo again on 1/12 showed severely dilated/moderately dysfunctional RV with severe TR and left-shifted septum with small LV.  Speed decreased on 1/12 to 5000 rpm over 2 sessions due to RV failure and milrinone 0.25 + NE 3  restarted.  Echo repeated on 1/16, RV looks better and LV larger but still with left shift of the septum.  Able to increase CVVH, weight down significantly.  Echo again on 1/18, speed decreased to 4900 with RV mildly dysfunctional and severely enlarged with severe TR.   MAP 70s today, CVP 8  with co-ox 50%  . Milrinone increased to 0.375 mcg. Norepi increased to 2 mcg.  - Continue midodrine 20 mg tid.   - He does not have BP room for sildenafil.  - Warfarin for INR 2-2.5.  INR 2.1 today, stop heparin drip - Continue 81 mg aspirin - Continue UNNA boots 2. Tricuspid regurgitation:  Tricuspid repair not done at time of VAD d/t proximity of ICD wires and hypotension during surgery.  He has severe TR.  3. Atrial fibrillation: Persistent since 10/22 based on device interrogation.  Rate controlled.   - Continue PO amio 200 bid  4. AKI on CKD stage 3: Gradual rise (was about 1.7 pre-op) since surgery.  He has not been hypotensive post-op, suspect intra-op hypotension led to development of ATN => urine sediment looked like ATN per renal. Now on CVVH. TDC placed 1/7.  - Maintain MAP and CO as above - Continues on UF.   - Nephrology following, suspect he will need iHD eventually.    5. CAD: History of PCI to OM1 in 2007 and RCA in 2013.  No CP or ACs.  - Deferred cardiac cath due to AKI and plan for VAD - Continue Crestor.  6. ID: Had fever 12/21, PCT 1.03.  Blood cultures negative. ?Phlebitis at IV site. Post-op fever with elevated WBCs, PCT 2.63 ->  1.78.  He completed course of cefepime. Afebrile with stable WBCs.   7. Constipation: Resolved.  8. Hyponatremia: Hypervolemic hyponatremia.  Controlled via CVVH.  9. Anemia: 1 unit PRBCs on 1/12,  1 unit PRBs 1/13, 1 unit 1/16. No overt bleeding, ?losses via CVVH.  Hgb 8.9  today.  10. Confusion/delirium: Initially post-op and started on Precedex.  Resolved.  11. F/E/N: TFs via [place] for poor nutritional status.  Improving po intake.  - Continue TFs  today.  - Prealbumin 7.8>9.5.  - Adding remeron today to help with appetite.  12. Left pleural effusion: - s/p thoracentesis 1/7 - s/p thoracentesis 1/12. 13. Pericardial effusion: Stable moderate pericardial effusion, primarily localized to along the lateral wall of the LV.  Does not appear to be affecting the RV/doubt tamponade.  Discussed after 1/12 and 1/18 echo with Dr. Cyndia Bent, conservative management for now.   Continue to ambulate.   I reviewed the LVAD parameters from today, and compared the results to the patient's prior recorded data.  No programming changes were made.  The LVAD is functioning within specified parameters.  The patient performs LVAD self-test daily.  LVAD interrogation was negative for any significant power changes, alarms or PI events/speed drops.  LVAD equipment check completed and is in good working order.  Back-up equipment present.   LVAD education done on emergency procedures and precautions and reviewed exit site care.  Darrick Grinder NP_C  10/09/2021 8:22 AM  Patient seen with NP, agree with the above note.    He feels better today, speed decreased yesterday to 4900 and CVVH slowed. Currently running even to net negative 50 cc/hr with CVP 10-11 today.  Early am co-ox lower at 50%.  He is on NE 2 and milrinone 0.375.    Walked 190 feet today.  Wants Cortrack out.    General: Well appearing this am. NAD.  HEENT: Normal. Neck: Supple, JVP 10 cm. Carotids OK.  Cardiac:  Mechanical heart sounds with LVAD hum present.  Lungs:  CTAB, normal effort.  Abdomen:  NT, ND, no HSM. No bruits or masses. +BS  LVAD exit site: Well-healed and incorporated. Dressing dry and intact. No erythema or drainage. Stabilization device present and accurately applied. Driveline dressing changed daily per sterile technique. Extremities:  Warm and dry. No cyanosis, clubbing, rash, or edema.  Neuro:  Alert & oriented x 3. Cranial nerves grossly intact. Moves all 4 extremities w/o  difficulty. Affect pleasant    Struggling with RV dysfunction/severe TR.  RV on echo yesterday actually looked somewhat better (mildly dysfunctional but severely enlarged with severe TR).  Speed down to 4900, only had 5-6 PI events after the change (improved).  Now on milrinone 0.375 + NE 2.  Co-ox 55%.  - Continue milrinone.  - Want to get NE off, decrease NE to 1.  Continue high dose midodrine.  - Run CVVH even today, keep CVP 10-12.  - Once he is off NE, will need to get him to intermittent HD.   INR 2.1, stop heparin gtt.    Telemetry shows A-V pacing  He really wants Cortrack out, we had a long discussion about this, he needs to be eating better.  Will give a trial of Cortrack out today, he knows he has to have good po intake to keep out.  Will start remeron for appetite.   CRITICAL CARE Performed by: Loralie Champagne  Total critical care time: 40 minutes  Critical care time was exclusive of separately billable procedures and treating  other patients.  Critical care was necessary to treat or prevent imminent or life-threatening deterioration.  Critical care was time spent personally by me on the following activities: development of treatment plan with patient and/or surrogate as well as nursing, discussions with consultants, evaluation of patient's response to treatment, examination of patient, obtaining history from patient or surrogate, ordering and performing treatments and interventions, ordering and review of laboratory studies, ordering and review of radiographic studies, pulse oximetry and re-evaluation of patient's condition.  Loralie Champagne 10/09/2021 9:01 AM

## 2021-10-09 NOTE — Progress Notes (Signed)
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 Acute component either from cardiorenal +/- ATN with decreased renal function from hemodynamics and CPB + hypotensive episodes + pressors.  - CRRT started on 1/3.       - back on NE and milrinone, weaning NE as tolerated --> plan is trial of iHD once off NE     - UF as tolerated today- gentle net neg if tolerating    - all 4K, no heparin intracircuit     -Cont monitoring daily I/Os + weights  -Maintain MAP>65 for optimal renal perfusion.  -Avoid nephrotoxic medications including NSAIDs, contrast. - Dose medications for a GFR <15 ml/min and also on CRRT.   Cardiogenic shock: s/p LVAD 09/17/22 (HM 3).   - TTE to assess for RV failure 10/02/21--> RV function down - VAD speed decreased 10/02/21 - on NE and milrinone   CAD with h/o PCI last in 2013  Anemia- iron indices 1/16 showing iron sat 12%, ferritin 477, Aranesp 10/06/21. Giving IV iron x 1g over 5 doses.   Afib- on amio  Acute hypoxic RF: s/p thoracentesis 1.3 L 10/02/21  Dispo: ICU, ultimately will need to be able to tolerate iHD, cont CRRT for now given pressor support.  Subjective:    No major new issues.   CRRT running fine per RN I/Os yest 2.7/3    Objective:   BP 95/84    Pulse (!) 156    Temp 98.6 F (37 C) (Oral)    Resp 17    Ht 5\' 9"  (1.753 m)    Wt 70 kg Comment: weighed with system controller, batteries NOT weighed   SpO2 100%    BMI 22.79 kg/m   Intake/Output Summary (Last 24 hours) at 10/09/2021 1205 Last data filed at 10/09/2021 1200 Gross per 24 hour  Intake 2648.79 ml  Output 2531 ml  Net 117.79 ml    Weight change: -0.9 kg  Physical Exam: GEN: NAD, A&Ox3 sitting in bed HEENT: wearing glasses LUNGS: normal WOB, clear ant CV: LVAD  EXT: minimal edema, much improved, legs wrapped at tibial level ACCESS: rt IJ TC c/d/i   Imaging: DG CHEST PORT 1 VIEW  Result  Date: 10/08/2021 CLINICAL DATA:  Shortness of breath EXAM: PORTABLE CHEST 1 VIEW COMPARISON:  10/04/2021 FINDINGS: Feeding tube tip courses below the level of the diaphragms. There is a right-sided dual lumen catheter with tip terminating in the cavoatrial junction. Left chest wall ICD is noted with leads in the right atrial appendage and right ventricle. LVAD is again noted projecting over the left lower chest. Right arm PICC line tip is in the SVC. Stable cardiomediastinal contours. New veil like opacification of the right lung compatible with pleural effusion. Aeration to the left lung base appears improved from previous exam. IMPRESSION: 1. New veil like opacification of the right lung compatible with pleural effusion. 2. Improved aeration to the left lung base. Electronically Signed   By: Kerby Moors M.D.   On: 10/08/2021 10:09   ECHOCARDIOGRAM COMPLETE  Result Date: 10/08/2021    ECHOCARDIOGRAM REPORT   Patient Name:   Isaac [name] Date of Exam: 10/08/2021 Medical Rec #:  [id number]           Height:       69.0 in Accession #:    [id number]          Weight:  156.3 lb Date of Birth:  19-Sep-1951            BSA:          1.861 m Patient Age:    71 years            BP:           92/79 mmHg Patient Gender: M                   HR:           60 bpm. Exam Location:  Inpatient Procedure: 2D Echo, Cardiac Doppler and Color Doppler Indications:    LVAD, CHF  History:        Patient has prior history of Echocardiogram examinations. CAD;                 Arrythmias:Atrial Fibrillation.  Sonographer:    Jyl Heinz Referring Phys: [phone] AMY D CLEGG IMPRESSIONS  1. LVAD inflow cannula at apex     4900 RPM, aortic valve does not open. RV moderately dilated     4800 RPM, aortic valve opens intermittently. RV moderately dilated. Left ventricular ejection fraction, by estimation, is 20 to 25%. The left ventricle has severely decreased function. The left ventricle has no regional wall motion abnormalities. There  is mild concentric left ventricular hypertrophy. Left ventricular diastolic parameters are consistent with Grade II diastolic dysfunction (pseudonormalization). Elevated left ventricular end-diastolic pressure.  2. Right ventricular systolic function is normal. The right ventricular size is moderately enlarged. There is normal pulmonary artery systolic pressure.  3. Pericardial effusion slightly smaller compared with the echo 10/06/21. a small pericardial effusion is present. The pericardial effusion is posterior to the left ventricle.  4. The mitral valve is normal in structure. No evidence of mitral valve regurgitation. No evidence of mitral stenosis.  5. Tricuspid valve regurgitation is moderate.  6. The aortic valve is tricuspid. Aortic valve regurgitation is trivial. No aortic stenosis is present.  7. Aortic dilatation noted. There is mild dilatation of the ascending aorta, measuring 39 mm.  8. The inferior vena cava is dilated in size with <50% respiratory variability, suggesting right atrial pressure of 15 mmHg. FINDINGS  Left Ventricle: LVAD inflow cannula at apex 4900 RPM, aortic valve does not open. RV moderately dilated 4800 RPM, aortic valve opens intermittently. RV moderately dilated. Left ventricular ejection fraction, by estimation, is 20 to 25%. The left ventricle has severely decreased function. The left ventricle has no regional wall motion abnormalities. The left ventricular internal cavity size was normal in size. There is mild concentric left ventricular hypertrophy. Left ventricular diastolic parameters are consistent with Grade II diastolic dysfunction (pseudonormalization). Elevated left ventricular end-diastolic pressure. Right Ventricle: The right ventricular size is moderately enlarged. No increase in right ventricular wall thickness. Right ventricular systolic function is normal. There is normal pulmonary artery systolic pressure. The tricuspid regurgitant velocity is 2.25 m/s, and with an  assumed right atrial pressure of 15 mmHg, the estimated right ventricular systolic pressure is 17.4 mmHg. Left Atrium: Left atrial size was normal in size. Right Atrium: Right atrial size was normal in size. Pericardium: Pericardial effusion slightly smaller compared with the echo 10/06/21. A small pericardial effusion is present. The pericardial effusion is posterior to the left ventricle. Mitral Valve: The mitral valve is normal in structure. No evidence of mitral valve regurgitation. No evidence of mitral valve stenosis. Tricuspid Valve: The tricuspid valve is normal in structure. Tricuspid valve regurgitation is moderate . No  evidence of tricuspid stenosis. Aortic Valve: The aortic valve is tricuspid. Aortic valve regurgitation is trivial. No aortic stenosis is present. Pulmonic Valve: The pulmonic valve was normal in structure. Pulmonic valve regurgitation is mild. No evidence of pulmonic stenosis. Aorta: Aortic dilatation noted. There is mild dilatation of the ascending aorta, measuring 39 mm. Venous: The inferior vena cava is dilated in size with less than 50% respiratory variability, suggesting right atrial pressure of 15 mmHg. IAS/Shunts: No atrial level shunt detected by color flow Doppler.  LEFT VENTRICLE PLAX 2D LVIDd:         4.20 cm   Diastology LVIDs:         3.30 cm   LV e' medial:    5.43 cm/s LV PW:         1.20 cm   LV E/e' medial:  12.9 LV IVS:        1.10 cm   LV e' lateral:   4.56 cm/s LVOT diam:     2.20 cm   LV E/e' lateral: 15.3 LVOT Area:     3.80 cm  RIGHT VENTRICLE            IVC RV Basal diam:  3.80 cm    IVC diam: 2.30 cm RV Mid diam:    3.90 cm RV S prime:     5.87 cm/s TAPSE (M-mode): 0.9 cm LEFT ATRIUM           Index        RIGHT ATRIUM           Index LA diam:      3.90 cm 2.10 cm/m   RA Area:     19.00 cm LA Vol (A4C): 34.9 ml 18.76 ml/m  RA Volume:   52.30 ml  28.11 ml/m                        PULMONIC VALVE AORTA                 PR End Diast Vel: 1.62 msec Ao Root diam: 3.60  cm Ao Asc diam:  3.90 cm MITRAL VALVE               TRICUSPID VALVE MV Area (PHT): 4.52 cm    TR Peak grad:   20.2 mmHg MV Decel Time: 168 msec    TR Vmax:        225.00 cm/s MV E velocity: 69.80 cm/s MV A velocity: 46.60 cm/s  SHUNTS MV E/A ratio:  1.50        Systemic Diam: 2.20 cm Skeet Latch MD Electronically signed by Skeet Latch MD Signature Date/Time: 10/08/2021/6:15:39 PM    Final     Labs: BMET Recent Labs  Lab 10/06/21 0258 10/06/21 1435 10/07/21 0411 10/07/21 1626 10/08/21 0316 10/08/21 1658 10/09/21 0504  NA 131* 135 135 132* 133* 135 137  K 4.9 5.2* 4.8 4.7 4.8 4.8 5.3*  CL 97* 102 101 97* 100 98 100  CO2 27 26 26 27 26 27 27   GLUCOSE 203* 120* 116* 171* 160* 130* 109*  BUN 22 25* 27* 27* 28* 27* 30*  CREATININE 1.51* 1.47* 1.57* 1.51* 1.39* 1.37* 1.44*  CALCIUM 7.9* 8.1* 8.1* 7.9* 8.0* 8.0* 8.6*  PHOS 1.3* 1.4* 1.9* 3.4 1.5* 2.1* 1.7*    CBC Recent Labs  Lab 10/06/21 0258 10/07/21 0814 10/08/21 0316 10/09/21 0504  WBC 10.9* 11.1* 12.4* 12.8*  HGB 7.8* 8.5* 8.3* 8.9*  HCT  25.7* 26.5* 26.9* 28.2*  MCV 95.2 92.0 94.7 92.8  PLT 404* 375 358 371     Medications:     sodium chloride   Intravenous Once   sodium chloride   Intravenous Once   sodium chloride   Intravenous Once   sodium chloride   Intravenous Once   acetaminophen  500 mg Oral Q6H   amiodarone  200 mg Oral BID   aspirin  81 mg Oral Daily   Chlorhexidine Gluconate Cloth  6 each Topical Daily   darbepoetin (ARANESP) injection - NON-DIALYSIS  60 mcg Subcutaneous Q Mon-1800   docusate sodium  100 mg Oral BID   feeding supplement  237 mL Oral TID BM   mouth rinse  15 mL Mouth Rinse BID   midodrine  20 mg Oral TID WC   mirtazapine  7.5 mg Oral QHS   montelukast  5 mg Oral QHS   multivitamin  1 tablet Oral QHS   pantoprazole  40 mg Oral Daily   polyethylene glycol  17 g Per Tube Daily   rosuvastatin  10 mg Oral Daily   senna  2 tablet Oral QHS   sodium chloride flush  10-40 mL  Intracatheter Q12H   sodium chloride flush  3 mL Intravenous Q12H   warfarin  1 mg Oral ONCE-1600   Warfarin - Pharmacist Dosing Inpatient   Does not apply q1600   Jannifer Hick MD [facility] Pager [phone]

## 2021-10-10 DIAGNOSIS — R570 Cardiogenic shock: Secondary | ICD-10-CM | POA: Diagnosis not present

## 2021-10-10 LAB — RENAL FUNCTION PANEL
Albumin: 1.9 g/dL — ABNORMAL LOW (ref 3.5–5.0)
Albumin: 1.9 g/dL — ABNORMAL LOW (ref 3.5–5.0)
Anion gap: 9 (ref 5–15)
Anion gap: 6 (ref 5–15)
BUN: 25 mg/dL — ABNORMAL HIGH (ref 8–23)
BUN: 25 mg/dL — ABNORMAL HIGH (ref 8–23)
CO2: 27 mmol/L (ref 22–32)
CO2: 26 mmol/L (ref 22–32)
Calcium: 7.2 mg/dL — ABNORMAL LOW (ref 8.9–10.3)
Calcium: 8.1 mg/dL — ABNORMAL LOW (ref 8.9–10.3)
Chloride: 99 mmol/L (ref 98–111)
Chloride: 102 mmol/L (ref 98–111)
Creatinine, Ser: 1.39 mg/dL — ABNORMAL HIGH (ref 0.61–1.24)
Creatinine, Ser: 1.78 mg/dL — ABNORMAL HIGH (ref 0.61–1.24)
GFR, Estimated: 55 mL/min — ABNORMAL LOW (ref >60)
GFR, Estimated: 41 mL/min — ABNORMAL LOW (ref >60)
Glucose, Bld: 132 mg/dL — ABNORMAL HIGH (ref 70–99)
Glucose, Bld: 114 mg/dL — ABNORMAL HIGH (ref 70–99)
Phosphorus: 2.5 mg/dL (ref 2.5–4.6)
Phosphorus: 2.6 mg/dL (ref 2.5–4.6)
Potassium: 5.1 mmol/L (ref 3.5–5.1)
Potassium: 5.3 mmol/L — ABNORMAL HIGH (ref 3.5–5.1)
Sodium: 135 mmol/L (ref 135–145)
Sodium: 134 mmol/L — ABNORMAL LOW (ref 135–145)

## 2021-10-10 LAB — PROTIME-INR
INR: 2.1 — ABNORMAL HIGH (ref 0.8–1.2)
Prothrombin Time: 23.7 seconds — ABNORMAL HIGH (ref 11.4–15.2)

## 2021-10-10 LAB — CBC
HCT: 25.8 % — ABNORMAL LOW (ref 39.0–52.0)
Hemoglobin: 8.1 g/dL — ABNORMAL LOW (ref 13.0–17.0)
MCH: 30.2 pg (ref 26.0–34.0)
MCHC: 31.4 g/dL (ref 30.0–36.0)
MCV: 96.3 fL (ref 80.0–100.0)
Platelets: 372 10*3/uL (ref 150–400)
RBC: 2.68 MIL/uL — ABNORMAL LOW (ref 4.22–5.81)
RDW: 21.6 % — ABNORMAL HIGH (ref 11.5–15.5)
WBC: 11.6 10*3/uL — ABNORMAL HIGH (ref 4.0–10.5)
nRBC: 0 % (ref 0.0–0.2)

## 2021-10-10 LAB — COOXEMETRY PANEL
Carboxyhemoglobin: 1.8 % — ABNORMAL HIGH (ref 0.5–1.5)
Methemoglobin: 0.8 % (ref 0.0–1.5)
O2 Saturation: 58.6 %
Total hemoglobin: 8.3 g/dL — ABNORMAL LOW (ref 12.0–16.0)

## 2021-10-10 LAB — URIC ACID: Uric Acid, Serum: 1.6 mg/dL — ABNORMAL LOW (ref 3.7–8.6)

## 2021-10-10 LAB — LACTATE DEHYDROGENASE: LDH: 261 U/L — ABNORMAL HIGH (ref 98–192)

## 2021-10-10 LAB — GLUCOSE, CAPILLARY
Glucose-Capillary: 108 mg/dL — ABNORMAL HIGH (ref 70–99)
Glucose-Capillary: 115 mg/dL — ABNORMAL HIGH (ref 70–99)
Glucose-Capillary: 127 mg/dL — ABNORMAL HIGH (ref 70–99)
Glucose-Capillary: 128 mg/dL — ABNORMAL HIGH (ref 70–99)
Glucose-Capillary: 146 mg/dL — ABNORMAL HIGH (ref 70–99)
Glucose-Capillary: 85 mg/dL (ref 70–99)
Glucose-Capillary: 88 mg/dL (ref 70–99)

## 2021-10-10 LAB — MAGNESIUM: Magnesium: 2.5 mg/dL — ABNORMAL HIGH (ref 1.7–2.4)

## 2021-10-10 MED ORDER — GERHARDT'S BUTT CREAM
TOPICAL_CREAM | CUTANEOUS | Status: DC | PRN
  Filled 2021-10-10: qty 1

## 2021-10-10 MED ORDER — ACETAMINOPHEN 500 MG PO TABS
500.0000 mg | ORAL_TABLET | Freq: Four times a day (QID) | ORAL | Status: DC | PRN
  Administered 2021-10-15: 08:00:00 500 mg via ORAL
  Filled 2021-10-10: qty 1

## 2021-10-10 MED ORDER — KATE FARMS STANDARD 1.4 PO LIQD
325.0000 mL | Freq: Three times a day (TID) | ORAL | Status: DC
  Filled 2021-10-10 (×10): qty 325

## 2021-10-10 MED ORDER — WARFARIN SODIUM 2 MG PO TABS
2.0000 mg | ORAL_TABLET | Freq: Once | ORAL | Status: AC
  Administered 2021-10-10: 2 mg via ORAL
  Filled 2021-10-10: qty 1

## 2021-10-10 NOTE — Progress Notes (Signed)
LVAD Coordinator Rounding Note:  Admitted 09/06/21 due to CHF exacerbation to Dr Claris Gladden service. He was started on Milrinone on admission. Febrile n 09/10/21- suspected to have UTI- antibiotics started. Impella 5.5 placed 09/12/21.   HM III LVAD implanted on 09/17/21 by Dr Cyndia Bent under destination therapy criteria. 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   Pt lying in bed resting. Reports he has walked already this morning. Pt is in good spirits today.  CRRT running even today. Levophed off.  Pt ate 3 good meals yesterday.   Vital signs: Temp: 97.7 HR: 60 AV paced Doppler Pressure: 75 Auto Cuff BP: 98/81 (89) O2 Sat: 95% on 2L [place] Wt: 185.2>196.8>194>189.1>192.2>198.4>189.6>188.7>183.6>177>160.2>157.6 >156.3>154.3>155.4 lbs    LVAD interrogation reveals:  Speed: 4900 Flow: 4.1 Power: 3.2w PI: 3.2 Hematocrit: 25  Alarms: none Events: none  Fixed speed: 4900 Low speed limit: 4600  Drive Line: Existing VAD dressing removed and site care performed using sterile technique. Drive line exit site cleaned with Chlora prep applicators x 2, allowed to dry, and gauze dressing with silver strip applied. Exit site not incorporated, the velour is fully implanted at exit site. Suture removed today. Scant amount of dried tissue fat necrosis drainage noted at site and on gauze. Slight redness, no tenderness, foul odor or rash noted.Drive line anchor secure. Advance to twice weekly dressing changes on Monday/Thursday per Nurse Davonna Belling, [place] coordinator, or trained caregiver.  Next dressing change due: 10/13/21.       Labs:  LDH trend: 344>379>370>378>380>331>296>281>291>293>295>270>277>264>299>261  INR trend: 1.6>1.8>2.8>2.9>2.1>2.0>2.2>2.4>2.4>2.5>2.3>1.4>1.5>2.1  WBC trend: 14.1>13.6>13.1>11>8.7>10.1>13.5>14>14.2>14.3>13.2>10.9>11.1>12.4>12.8>11.6  Cr trend:  2.11>2.39>5.24>3.45>2.38>1.97>1.87>1.68>1.73>1.60>1.49>1.52>1.51>1.57>1.39>1.44>1.39  Hgb trend: 6.5>7.5>8.1>8.8>7.7>8.6>7.7>7.8>8.5>8.3>8.9>8.1  Anticoagulation Plan: -INR Goal: 2.0 - 2.5 -ASA Dose: 81 mg daily   Blood Products:  Intra Op: 09/17/21  1 PLT  4 FFP   4 PRBC  Cellsaver- 803 cc Post Op:  - 09/17/21>>1 PRBC - 09/18/21>> 3 PRBCs - 09/19/21>> 1 PRBC -  09/26/21>> 1 PRBC - 10/02/21>>1 PRBC - 10/06/21 >> 1 PRBC  Device: - [employer] dual -Therapies: OFF  *Will need to be referred to device clinic at discharge*  Arrythmias: 3 episodes of VT that required defib while in OR 09/17/21. Currently on Amiodarone gtt. Remains in afib.   Respiratory: extubated 09/18/22  Nitric Oxide:  off 09/18/22  Renal:  09/23/21>>started CVVH  Infection:  09/10/21>> blood cultures >> No growth 5 days; final 09/12/21>> urine culture >> No growth; final  09/25/21>>blood cultures>>negative  09/27/21>>pleural effusion>>negative, final 10/02/21>> pleural effusion >> no growth; final   Drips:  Milrinone 0.347mcg/kg/min (restarted 10/02/21) Levo 1 mcg/min-off Tube feed 65 cc/hr-off Heparin 500 units/hr-off today  Patient Education: No family at bedside. Patient tired and in bed. Not appropriate for teaching at this time  Plan/Recommendations:  1. [place] VAD coordinator for drive line or equipment concerns 2. Twice a week dressing change per Nurse Davonna Belling or [place] coordinator on Monday and Thursday.  [name] RN [place] Coordinator  Office: [phone]  24/7 Pager: [phone]

## 2021-10-10 NOTE — Progress Notes (Addendum)
Patient ID: Isaac [name], male   DOB: [birth year], 71 y.o.   MRN: [id number]    Advanced Heart Failure VAD Team Note  PCP-Cardiologist: None   Subjective:    12/19 RHC- RA 7, PA 39/14 (25), PCWP 11, CO 6.4 CI 3. Thermo 3.5 1.8. Lasix drip stopped after cath. 12/20 Swan removed.  Norepi 3 mcg added.  12/21 Fever. Blood CX drawn. UA + leukocytes. Started on vanc and cefepime. Diuresed with IV lasix + metolazone.  12/23 Impella 5.5 placed 12/28 HM III LVAD, ICD leads plastered to tricuspid valve with severe TR, valve not replaced and some improvement in TR with LVAD placement.  12/29 Extubated 01/03 Started CRRT 01/07  L thoracentesis and placement of [facility] 01/09 LVAD speed increased to 5600 rpm 01/10 Milrinone stopped. Amio gtt>>PO. Completed 5 day course Cefepime.  01/12 Ramp echo done: Moderate lateral pericardial effusion w/o tamponade, RV larger with severe dilation/moderate dysfunction and left shift of the septum, LV small and undershifted.  TR severe.  LVAD speed decreased to 5000 rpms at 2 different sessions.  Also had thoracentesis on left with 1.3 L off.  01/13 TFs begun 01/16 echo reviewed: EF 25-30%, LV larger than prior but septum still with left shift, RV moderately dilated and moderately dysfunctional, moderate-severe TR, mod-large localized pericardial effusion lateral to the LV.  1/18 Ramp Echo with speed decreased to 4900   He remains on milrinone 0.375. NE off. CO-OX 58.6%   MAPs 70s-80s  CVP 12-13. CVVH ongoing, pulling for net even. 1900 cc removed yesterday   Feeling better today. Ate several complete meals yesterday. Ambulated twice.   No CP or dyspnea.   Reports right great toe pain.   LDH [phone] INR 2.1   Hgb 8.1  Receiving IV iron X 5 days   LVAD INTERROGATION:  HeartMate III LVAD:   Flow 3.9  liters/min, speed 4900, power 3.2 PI  3.8. No alarms. VAD interrogated personally.      Objective:    Vital Signs:   Temp:  [98.2 F (36.8  C)-98.7 F (37.1 C)] 98.2 F (36.8 C) (01/20 0400) Pulse Rate:  [55-156] 88 (01/20 0700) Resp:  [12-28] 28 (01/20 0700) BP: (69-147)/(57-125) 87/65 (01/20 0700) SpO2:  [86 %-100 %] 97 % (01/20 0600) Weight:  [70.5 kg] 70.5 kg (01/20 0600) Last BM Date: 10/09/21 Mean arterial Pressure 70s-80s   Intake/Output:   Intake/Output Summary (Last 24 hours) at 10/10/2021 0752 Last data filed at 10/10/2021 0700 Gross per 24 hour  Intake 1690.45 ml  Output 1934 ml  Net -243.55 ml     Physical Exam  CVP 12-13  Physical Exam: GENERAL: No distress.  HEENT: normal  NECK: R IJ tunneled HD cath. JVP 10 cm  CARDIAC:  Mechanical heart sounds with LVAD hum present.  LUNGS:  Clear to auscultation bilaterally.  ABDOMEN:  Soft, round, nontender, positive bowel sounds x4.     LVAD exit site: well-healed and incorporated.  Dressing dry and intact.  No erythema or drainage.  Stabilization device present and accurately applied.  Driveline dressing is being changed daily per sterile technique. EXTREMITIES:  Warm and dry, no cyanosis, clubbing, rash, UNNA boots on, R great toe tender NEUROLOGIC:  Alert and oriented x 4.  Gait steady.  No aphasia.  No dysarthria.  Affect pleasant.       Telemetry   A/V pacing, 60 (personally reviewed)  Labs   Basic Metabolic Panel: Recent Labs  Lab 10/06/21 0258 10/06/21 1435 10/07/21 0411 10/07/21  1626 10/08/21 0316 10/08/21 1658 10/09/21 0504 10/09/21 1552 10/10/21 0437  NA 131*   < > 135   < > 133* 135 137 137 135  K 4.9   < > 4.8   < > 4.8 4.8 5.3* 4.7 5.1  CL 97*   < > 101   < > 100 98 100 101 99  CO2 27   < > 26   < > 26 27 27 26 27   GLUCOSE 203*   < > 116*   < > 160* 130* 109* 106* 132*  BUN 22   < > 27*   < > 28* 27* 30* 33* 25*  CREATININE 1.51*   < > 1.57*   < > 1.39* 1.37* 1.44* 1.53* 1.39*  CALCIUM 7.9*   < > 8.1*   < > 8.0* 8.0* 8.6* 7.5* 7.2*  MG 2.4  --  2.2  --  2.3  --  2.4  --  2.5*  PHOS 1.3*   < > 1.9*   < > 1.5* 2.1* 1.7* 4.1 2.5    < > = values in this interval not displayed.    Liver Function Tests: Recent Labs  Lab 10/08/21 0316 10/08/21 1658 10/09/21 0504 10/09/21 1552 10/10/21 0437  ALBUMIN 2.0* 2.0* 2.0* 1.9* 1.9*   No results for input(s): LIPASE, AMYLASE in the last 168 hours. No results for input(s): AMMONIA in the last 168 hours.   CBC: Recent Labs  Lab 10/06/21 0258 10/07/21 0814 10/08/21 0316 10/09/21 0504 10/10/21 0437  WBC 10.9* 11.1* 12.4* 12.8* 11.6*  HGB 7.8* 8.5* 8.3* 8.9* 8.1*  HCT 25.7* 26.5* 26.9* 28.2* 25.8*  MCV 95.2 92.0 94.7 92.8 96.3  PLT 404* 375 358 371 372    INR: Recent Labs  Lab 10/07/21 0411 10/08/21 0316 10/08/21 0542 10/09/21 0504 10/10/21 0437  INR 1.4* 1.5* 1.5* 2.1* 2.1*    Other results:   Imaging   DG CHEST PORT 1 VIEW  Result Date: 10/08/2021 CLINICAL DATA:  Shortness of breath EXAM: PORTABLE CHEST 1 VIEW COMPARISON:  10/04/2021 FINDINGS: Feeding tube tip courses below the level of the diaphragms. There is a right-sided dual lumen catheter with tip terminating in the cavoatrial junction. Left chest wall ICD is noted with leads in the right atrial appendage and right ventricle. LVAD is again noted projecting over the left lower chest. Right arm PICC line tip is in the SVC. Stable cardiomediastinal contours. New veil like opacification of the right lung compatible with pleural effusion. Aeration to the left lung base appears improved from previous exam. IMPRESSION: 1. New veil like opacification of the right lung compatible with pleural effusion. 2. Improved aeration to the left lung base. Electronically Signed   By: Kerby Moors M.D.   On: 10/08/2021 10:09   ECHOCARDIOGRAM COMPLETE  Result Date: 10/08/2021    ECHOCARDIOGRAM REPORT   Patient Name:   Isaac [name] Date of Exam: 10/08/2021 Medical Rec #:  [id number]           Height:       69.0 in Accession #:    [id number]          Weight:       156.3 lb Date of Birth:  18-Feb-1951            BSA:           1.861 m Patient Age:    30 years            BP:  92/79 mmHg Patient Gender: M                   HR:           60 bpm. Exam Location:  Inpatient Procedure: 2D Echo, Cardiac Doppler and Color Doppler Indications:    LVAD, CHF  History:        Patient has prior history of Echocardiogram examinations. CAD;                 Arrythmias:Atrial Fibrillation.  Sonographer:    Jyl Heinz Referring Phys: [phone] AMY D CLEGG IMPRESSIONS  1. LVAD inflow cannula at apex     4900 RPM, aortic valve does not open. RV moderately dilated     4800 RPM, aortic valve opens intermittently. RV moderately dilated. Left ventricular ejection fraction, by estimation, is 20 to 25%. The left ventricle has severely decreased function. The left ventricle has no regional wall motion abnormalities. There is mild concentric left ventricular hypertrophy. Left ventricular diastolic parameters are consistent with Grade II diastolic dysfunction (pseudonormalization). Elevated left ventricular end-diastolic pressure.  2. Right ventricular systolic function is normal. The right ventricular size is moderately enlarged. There is normal pulmonary artery systolic pressure.  3. Pericardial effusion slightly smaller compared with the echo 10/06/21. a small pericardial effusion is present. The pericardial effusion is posterior to the left ventricle.  4. The mitral valve is normal in structure. No evidence of mitral valve regurgitation. No evidence of mitral stenosis.  5. Tricuspid valve regurgitation is moderate.  6. The aortic valve is tricuspid. Aortic valve regurgitation is trivial. No aortic stenosis is present.  7. Aortic dilatation noted. There is mild dilatation of the ascending aorta, measuring 39 mm.  8. The inferior vena cava is dilated in size with <50% respiratory variability, suggesting right atrial pressure of 15 mmHg. FINDINGS  Left Ventricle: LVAD inflow cannula at apex 4900 RPM, aortic valve does not open. RV moderately dilated  4800 RPM, aortic valve opens intermittently. RV moderately dilated. Left ventricular ejection fraction, by estimation, is 20 to 25%. The left ventricle has severely decreased function. The left ventricle has no regional wall motion abnormalities. The left ventricular internal cavity size was normal in size. There is mild concentric left ventricular hypertrophy. Left ventricular diastolic parameters are consistent with Grade II diastolic dysfunction (pseudonormalization). Elevated left ventricular end-diastolic pressure. Right Ventricle: The right ventricular size is moderately enlarged. No increase in right ventricular wall thickness. Right ventricular systolic function is normal. There is normal pulmonary artery systolic pressure. The tricuspid regurgitant velocity is 2.25 m/s, and with an assumed right atrial pressure of 15 mmHg, the estimated right ventricular systolic pressure is 25.6 mmHg. Left Atrium: Left atrial size was normal in size. Right Atrium: Right atrial size was normal in size. Pericardium: Pericardial effusion slightly smaller compared with the echo 10/06/21. A small pericardial effusion is present. The pericardial effusion is posterior to the left ventricle. Mitral Valve: The mitral valve is normal in structure. No evidence of mitral valve regurgitation. No evidence of mitral valve stenosis. Tricuspid Valve: The tricuspid valve is normal in structure. Tricuspid valve regurgitation is moderate . No evidence of tricuspid stenosis. Aortic Valve: The aortic valve is tricuspid. Aortic valve regurgitation is trivial. No aortic stenosis is present. Pulmonic Valve: The pulmonic valve was normal in structure. Pulmonic valve regurgitation is mild. No evidence of pulmonic stenosis. Aorta: Aortic dilatation noted. There is mild dilatation of the ascending aorta, measuring 39 mm. Venous: The inferior vena cava  is dilated in size with less than 50% respiratory variability, suggesting right atrial pressure of 15  mmHg. IAS/Shunts: No atrial level shunt detected by color flow Doppler.  LEFT VENTRICLE PLAX 2D LVIDd:         4.20 cm   Diastology LVIDs:         3.30 cm   LV e' medial:    5.43 cm/s LV PW:         1.20 cm   LV E/e' medial:  12.9 LV IVS:        1.10 cm   LV e' lateral:   4.56 cm/s LVOT diam:     2.20 cm   LV E/e' lateral: 15.3 LVOT Area:     3.80 cm  RIGHT VENTRICLE            IVC RV Basal diam:  3.80 cm    IVC diam: 2.30 cm RV Mid diam:    3.90 cm RV S prime:     5.87 cm/s TAPSE (M-mode): 0.9 cm LEFT ATRIUM           Index        RIGHT ATRIUM           Index LA diam:      3.90 cm 2.10 cm/m   RA Area:     19.00 cm LA Vol (A4C): 34.9 ml 18.76 ml/m  RA Volume:   52.30 ml  28.11 ml/m                        PULMONIC VALVE AORTA                 PR End Diast Vel: 1.62 msec Ao Root diam: 3.60 cm Ao Asc diam:  3.90 cm MITRAL VALVE               TRICUSPID VALVE MV Area (PHT): 4.52 cm    TR Peak grad:   20.2 mmHg MV Decel Time: 168 msec    TR Vmax:        225.00 cm/s MV E velocity: 69.80 cm/s MV A velocity: 46.60 cm/s  SHUNTS MV E/A ratio:  1.50        Systemic Diam: 2.20 cm Skeet Latch MD Electronically signed by Skeet Latch MD Signature Date/Time: 10/08/2021/6:15:39 PM    Final      Medications:     Scheduled Medications:  sodium chloride   Intravenous Once   sodium chloride   Intravenous Once   sodium chloride   Intravenous Once   sodium chloride   Intravenous Once   acetaminophen  500 mg Oral Q6H   amiodarone  200 mg Oral BID   aspirin  81 mg Oral Daily   Chlorhexidine Gluconate Cloth  6 each Topical Daily   darbepoetin (ARANESP) injection - NON-DIALYSIS  60 mcg Subcutaneous Q Mon-1800   docusate sodium  100 mg Oral BID   feeding supplement  237 mL Oral TID BM   mouth rinse  15 mL Mouth Rinse BID   midodrine  20 mg Oral TID WC   mirtazapine  7.5 mg Oral QHS   montelukast  5 mg Oral QHS   multivitamin  1 tablet Oral QHS   pantoprazole  40 mg Oral Daily   polyethylene glycol  17 g Per  Tube Daily   rosuvastatin  10 mg Oral Daily   senna  2 tablet Oral QHS   sodium chloride flush  10-40 mL  Intracatheter Q12H   sodium chloride flush  3 mL Intravenous Q12H   Warfarin - Pharmacist Dosing Inpatient   Does not apply q1600    Infusions:   prismasol BGK 4/2.5 400 mL/hr at 10/10/21 0125    prismasol BGK 4/2.5 200 mL/hr at 10/10/21 0132   sodium chloride Stopped (09/20/21 0304)   sodium chloride Stopped (09/18/21 0803)   sodium chloride Stopped (10/02/21 0900)   feeding supplement (PIVOT 1.5 CAL) Stopped (10/09/21 0901)   iron sucrose Stopped (10/09/21 1351)   milrinone 0.375 mcg/kg/min (10/10/21 0700)   norepinephrine (LEVOPHED) Adult infusion Stopped (10/09/21 1206)   prismasol BGK 4/2.5 1,800 mL/hr at 10/10/21 0634   promethazine (PHENERGAN) injection (IM or IVPB) Stopped (10/01/21 1714)    PRN Medications: sodium chloride, sodium chloride, albuterol, heparin, heparin, lip balm, ondansetron (ZOFRAN) IV, oxyCODONE, polyethylene glycol, promethazine (PHENERGAN) injection (IM or IVPB), sodium chloride flush, sodium chloride flush, traMADol   Assessment/Plan:    1. Acute on chronic systolic CHF:  Long-standing cardiomyopathy.  [place].  Echo this admission with EF < 20%, severe LV dilation, restrictive diastolic function, moderate RV dysfunction, moderate MR, mod-severe TR. Cause of cardiomyopathy is uncertain.  He has a history of CAD, but I do not think that the described CAD from the past could explain his cardiomyopathy, but CAD could have progressed.  With difficulty tolerating GDMT/need for midodrine and cardiorenal syndrome as well as profound volume overload + NYHA class IV symptoms,  concerned for low output HF. Co-ox off milrinone was 36%, milrinone started and increased to 0.375 but CO remained low. NE added and Impella 5.5 placed 12/23. EF 10% on TEE 12/23. s/p HM III VAD on 12/28.  Ramp echo 01/03 with EF 20-25%, RV moderately reduced, septum mid-line.   Ramp echo again on 1/12 showed severely dilated/moderately dysfunctional RV with severe TR and left-shifted septum with small LV.  Speed decreased on 1/12 to 5000 rpm over 2 sessions due to RV failure and milrinone 0.25 + NE 3 restarted.  Echo repeated on 1/16, RV looks better and LV larger but still with left shift of the septum.  Able to increase CVVH, weight down significantly.  Echo again on 1/18, speed decreased to 4900 with RV mildly dysfunctional and severely enlarged with severe TR.   - MAP 70s - 80s today, CVP 12-13 with co-ox 59%  . On milrinone 0.375, NE now off - Volume being managed with CRRT. Aim for slightly net negative today - Continue midodrine 20 mg tid.   - He does not have BP room for sildenafil.  - Warfarin for INR 2-2.5.  INR 2.1 today, off heparin gtt - Continue 81 mg aspirin - Continue UNNA boots 2. Tricuspid regurgitation:  Tricuspid repair not done at time of VAD d/t proximity of ICD wires and hypotension during surgery.  He has severe TR.  3. Atrial fibrillation: Persistent since 10/22 based on device interrogation.  Rate controlled.   - Continue PO amio 200 bid  4. AKI on CKD stage 3: Gradual rise (was about 1.7 pre-op) since surgery.  He has not been hypotensive post-op, suspect intra-op hypotension led to development of ATN => urine sediment looked like ATN per renal. Now on CVVH. TDC placed 1/7.  - Maintain MAP and CO as above - Continues on UF.  Currently pulling for net even. Can attempt for slightly negative today.  - Nephrology following, suspect he will need iHD eventually.    5. CAD: History of PCI to OM1  in 2007 and RCA in 2013.  No CP or ACs.  - Deferred cardiac cath due to AKI and plan for VAD - Continue Crestor.  6. ID: Had fever 12/21, PCT 1.03.  Blood cultures negative. ?Phlebitis at IV site. Post-op fever with elevated WBCs, PCT 2.63 -> 1.78.  He completed course of cefepime. Afebrile with stable WBCs.   7. Constipation: Resolved.  8. Hyponatremia:  Hypervolemic hyponatremia.  Controlled via CVVH.  9. Anemia: 1 unit PRBCs on 1/12,  1 unit PRBs 1/13, 1 unit 1/16. No overt bleeding, ?losses via CVVH.  Hgb 8.1 today.  - Receiving IV iron per nephrology 10. Confusion/delirium: Initially post-op and started on Precedex.  Resolved.  11. F/E/N: TFs via [place] for poor nutritional status.  Improving po intake.  - TF off. Ate well yesterday. Continue to monitor. - Prealbumin 7.8>9.5.  - Added remeron to help with appetite.  12. Left pleural effusion: - s/p thoracentesis 1/7 - s/p thoracentesis 1/12. 13. Pericardial effusion: Stable moderate pericardial effusion, primarily localized to along the lateral wall of the LV.  Does not appear to be affecting the RV/doubt tamponade.  Discussed after 1/12 and 1/18 echo with Dr. Cyndia Bent, conservative management for now.  14. Right great toe pain: - ? Possible gout.  - Check uric acid level  Continue to ambulate.   Will eventually need acute inpatient rehab.   I reviewed the LVAD parameters from today, and compared the results to the patient's prior recorded data.  No programming changes were made.  The LVAD is functioning within specified parameters.  The patient performs LVAD self-test daily.  LVAD interrogation was negative for any significant power changes, alarms or PI events/speed drops.  LVAD equipment check completed and is in good working order.  Back-up equipment present.   LVAD education done on emergency procedures and precautions and reviewed exit site care.  [facility], LINDSAY N PA-C 10/10/2021 7:52 AM  Patient seen with PA, agree with the above note.  Having a good day so far, already walked in hall.  Ate all breakfast. Co-ox 59%.  He is now off NE,  remains on milrinone 0.375.    CVP 12-13, have been running CVVH even.   General: Well appearing this am. NAD.  HEENT: Normal. Neck: Supple, JVP 12 cm cm. Carotids OK.  Cardiac:  Mechanical heart sounds with LVAD hum present.  Lungs:   CTAB, normal effort.  Abdomen:  NT, ND, no HSM. No bruits or masses. +BS  LVAD exit site: Well-healed and incorporated. Dressing dry and intact. No erythema or drainage. Stabilization device present and accurately applied. Driveline dressing changed daily per sterile technique. Extremities:  Warm and dry. No cyanosis, clubbing, rash, or edema.  Neuro:  Alert & oriented x 3. Cranial nerves grossly intact. Moves all 4 extremities w/o difficulty. Affect pleasant     Patient has been struggling with RV dysfunction/severe TR.  RV on last echo actually looked somewhat better (mildly dysfunctional but severely enlarged with severe TR).  Speed down to 4900, only had few PI events after the change (improved). Now on milrinone 0.375 and off NE.  Co-ox 59%.  - Continue milrinone, suspect will need to go home on this at least for now though may be able to slowly wean.  - Continue midodrine.  - Run CVVH even to -50 cc/hr net today, keep CVP 10-12 range with RV dysfunction/TR.  - Now that he is off NE, will need transition to iHD => timing per renal.  INR 2.1, continue warfarin.    He is in NSR today.    Continue to push nutrition and exercise.  To step down when he is on iHD.   CRITICAL CARE Performed by: Loralie Champagne  Total critical care time: 35 minutes  Critical care time was exclusive of separately billable procedures and treating other patients.  Critical care was necessary to treat or prevent imminent or life-threatening deterioration.  Critical care was time spent personally by me on the following activities: development of treatment plan with patient and/or surrogate as well as nursing, discussions with consultants, evaluation of patient's response to treatment, examination of patient, obtaining history from patient or surrogate, ordering and performing treatments and interventions, ordering and review of laboratory studies, ordering and review of radiographic studies, pulse oximetry and  re-evaluation of patient's condition.  Loralie Champagne 10/10/2021 8:45 AM

## 2021-10-10 NOTE — Progress Notes (Signed)
Outpatient HF CSW came by to check in on patient- working with PT at this time.  Will continue to follow during inpatient admission and assist as needed  Jorge Ny, [facility] Desk#: [phone] Cell#: [phone]

## 2021-10-10 NOTE — Progress Notes (Signed)
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 Acute component either from cardiorenal +/- ATN with decreased renal function from hemodynamics and CPB + hypotensive episodes + pressors.  - CRRT started on 1/3.  -plan to hold CRRT at end of this shift or if clots sooner.  Then will monitor and plan next RRT iHD in coming days --> ideally Monday but will follow labs/volume daily.         -Cont monitoring daily I/Os + weights  -Maintain MAP>65 for optimal renal perfusion.  -Avoid nephrotoxic medications including NSAIDs, contrast. - Dose medications for a GFR <15 ml/min and also on CRRT.   Cardiogenic shock: s/p LVAD 09/17/22 (HM 3).   - on milrinone and midodrine now, off NE now  CAD with h/o PCI last in 2013  Anemia- iron indices 1/16 showing iron sat 12%, ferritin 477, Aranesp 10/06/21. Giving IV iron x 1g over 5 doses.   Afib- on amio  Acute hypoxic RF: s/p thoracentesis 1.3 L 10/02/21  Dispo: ICU, ultimately will need to be able to tolerate iHD, moving in that direction now  Subjective:    No major new issues.  Seems to be feeling some better - appetite and energy a bit better today.  Off NE now. CRRT running fine per RN I/Os yest 1.8/1.9.    Objective:   BP 98/81 (BP Location: Left Arm)    Pulse (!) 193    Temp 97.7 F (36.5 C) (Oral)    Resp 19    Ht 5\' 9"  (1.753 m)    Wt 70.5 kg    SpO2 99%    BMI 22.95 kg/m   Intake/Output Summary (Last 24 hours) at 10/10/2021 1135 Last data filed at 10/10/2021 1100 Gross per 24 hour  Intake 1256.14 ml  Output 1710 ml  Net -453.86 ml    Weight change: 0.5 kg  Physical Exam: GEN: NAD, A&Ox3 sitting in bed HEENT: wearing glasses LUNGS: normal WOB, clear ant CV: LVAD  EXT: minimal edema, much improved, legs wrapped at tibial level ACCESS: rt IJ TC c/d/i   Imaging: ECHOCARDIOGRAM COMPLETE  Result Date: 10/08/2021    ECHOCARDIOGRAM  REPORT   Patient Name:   Isaac [name] Date of Exam: 10/08/2021 Medical Rec #:  [id number]           Height:       69.0 in Accession #:    [id number]          Weight:       156.3 lb Date of Birth:  Jun 06, 1951            BSA:          1.861 m Patient Age:    71 years            BP:           92/79 mmHg Patient Gender: M                   HR:           60 bpm. Exam Location:  Inpatient Procedure: 2D Echo, Cardiac Doppler and Color Doppler Indications:    LVAD, CHF  History:        Patient has prior history of Echocardiogram examinations. CAD;                 Arrythmias:Atrial Fibrillation.  Sonographer:  Jyl Heinz Referring Phys: [phone] AMY D CLEGG IMPRESSIONS  1. LVAD inflow cannula at apex     4900 RPM, aortic valve does not open. RV moderately dilated     4800 RPM, aortic valve opens intermittently. RV moderately dilated. Left ventricular ejection fraction, by estimation, is 20 to 25%. The left ventricle has severely decreased function. The left ventricle has no regional wall motion abnormalities. There is mild concentric left ventricular hypertrophy. Left ventricular diastolic parameters are consistent with Grade II diastolic dysfunction (pseudonormalization). Elevated left ventricular end-diastolic pressure.  2. Right ventricular systolic function is normal. The right ventricular size is moderately enlarged. There is normal pulmonary artery systolic pressure.  3. Pericardial effusion slightly smaller compared with the echo 10/06/21. a small pericardial effusion is present. The pericardial effusion is posterior to the left ventricle.  4. The mitral valve is normal in structure. No evidence of mitral valve regurgitation. No evidence of mitral stenosis.  5. Tricuspid valve regurgitation is moderate.  6. The aortic valve is tricuspid. Aortic valve regurgitation is trivial. No aortic stenosis is present.  7. Aortic dilatation noted. There is mild dilatation of the ascending aorta, measuring 39 mm.  8. The  inferior vena cava is dilated in size with <50% respiratory variability, suggesting right atrial pressure of 15 mmHg. FINDINGS  Left Ventricle: LVAD inflow cannula at apex 4900 RPM, aortic valve does not open. RV moderately dilated 4800 RPM, aortic valve opens intermittently. RV moderately dilated. Left ventricular ejection fraction, by estimation, is 20 to 25%. The left ventricle has severely decreased function. The left ventricle has no regional wall motion abnormalities. The left ventricular internal cavity size was normal in size. There is mild concentric left ventricular hypertrophy. Left ventricular diastolic parameters are consistent with Grade II diastolic dysfunction (pseudonormalization). Elevated left ventricular end-diastolic pressure. Right Ventricle: The right ventricular size is moderately enlarged. No increase in right ventricular wall thickness. Right ventricular systolic function is normal. There is normal pulmonary artery systolic pressure. The tricuspid regurgitant velocity is 2.25 m/s, and with an assumed right atrial pressure of 15 mmHg, the estimated right ventricular systolic pressure is 04.5 mmHg. Left Atrium: Left atrial size was normal in size. Right Atrium: Right atrial size was normal in size. Pericardium: Pericardial effusion slightly smaller compared with the echo 10/06/21. A small pericardial effusion is present. The pericardial effusion is posterior to the left ventricle. Mitral Valve: The mitral valve is normal in structure. No evidence of mitral valve regurgitation. No evidence of mitral valve stenosis. Tricuspid Valve: The tricuspid valve is normal in structure. Tricuspid valve regurgitation is moderate . No evidence of tricuspid stenosis. Aortic Valve: The aortic valve is tricuspid. Aortic valve regurgitation is trivial. No aortic stenosis is present. Pulmonic Valve: The pulmonic valve was normal in structure. Pulmonic valve regurgitation is mild. No evidence of pulmonic stenosis.  Aorta: Aortic dilatation noted. There is mild dilatation of the ascending aorta, measuring 39 mm. Venous: The inferior vena cava is dilated in size with less than 50% respiratory variability, suggesting right atrial pressure of 15 mmHg. IAS/Shunts: No atrial level shunt detected by color flow Doppler.  LEFT VENTRICLE PLAX 2D LVIDd:         4.20 cm   Diastology LVIDs:         3.30 cm   LV e' medial:    5.43 cm/s LV PW:         1.20 cm   LV E/e' medial:  12.9 LV IVS:  1.10 cm   LV e' lateral:   4.56 cm/s LVOT diam:     2.20 cm   LV E/e' lateral: 15.3 LVOT Area:     3.80 cm  RIGHT VENTRICLE            IVC RV Basal diam:  3.80 cm    IVC diam: 2.30 cm RV Mid diam:    3.90 cm RV S prime:     5.87 cm/s TAPSE (M-mode): 0.9 cm LEFT ATRIUM           Index        RIGHT ATRIUM           Index LA diam:      3.90 cm 2.10 cm/m   RA Area:     19.00 cm LA Vol (A4C): 34.9 ml 18.76 ml/m  RA Volume:   52.30 ml  28.11 ml/m                        PULMONIC VALVE AORTA                 PR End Diast Vel: 1.62 msec Ao Root diam: 3.60 cm Ao Asc diam:  3.90 cm MITRAL VALVE               TRICUSPID VALVE MV Area (PHT): 4.52 cm    TR Peak grad:   20.2 mmHg MV Decel Time: 168 msec    TR Vmax:        225.00 cm/s MV E velocity: 69.80 cm/s MV A velocity: 46.60 cm/s  SHUNTS MV E/A ratio:  1.50        Systemic Diam: 2.20 cm Skeet Latch MD Electronically signed by Skeet Latch MD Signature Date/Time: 10/08/2021/6:15:39 PM    Final     Labs: BMET Recent Labs  Lab 10/07/21 0411 10/07/21 1626 10/08/21 0316 10/08/21 1658 10/09/21 0504 10/09/21 1552 10/10/21 0437  NA 135 132* 133* 135 137 137 135  K 4.8 4.7 4.8 4.8 5.3* 4.7 5.1  CL 101 97* 100 98 100 101 99  CO2 26 27 26 27 27 26 27   GLUCOSE 116* 171* 160* 130* 109* 106* 132*  BUN 27* 27* 28* 27* 30* 33* 25*  CREATININE 1.57* 1.51* 1.39* 1.37* 1.44* 1.53* 1.39*  CALCIUM 8.1* 7.9* 8.0* 8.0* 8.6* 7.5* 7.2*  PHOS 1.9* 3.4 1.5* 2.1* 1.7* 4.1 2.5    CBC Recent Labs   Lab 10/07/21 0814 10/08/21 0316 10/09/21 0504 10/10/21 0437  WBC 11.1* 12.4* 12.8* 11.6*  HGB 8.5* 8.3* 8.9* 8.1*  HCT 26.5* 26.9* 28.2* 25.8*  MCV 92.0 94.7 92.8 96.3  PLT 375 358 371 372     Medications:     sodium chloride   Intravenous Once   sodium chloride   Intravenous Once   sodium chloride   Intravenous Once   sodium chloride   Intravenous Once   amiodarone  200 mg Oral BID   aspirin  81 mg Oral Daily   Chlorhexidine Gluconate Cloth  6 each Topical Daily   darbepoetin (ARANESP) injection - NON-DIALYSIS  60 mcg Subcutaneous Q Mon-1800   docusate sodium  100 mg Oral BID   feeding supplement  237 mL Oral TID BM   mouth rinse  15 mL Mouth Rinse BID   midodrine  20 mg Oral TID WC   mirtazapine  7.5 mg Oral QHS   montelukast  5 mg Oral QHS   multivitamin  1 tablet Oral  QHS   pantoprazole  40 mg Oral Daily   rosuvastatin  10 mg Oral Daily   senna  2 tablet Oral QHS   sodium chloride flush  10-40 mL Intracatheter Q12H   sodium chloride flush  3 mL Intravenous Q12H   warfarin  2 mg Oral ONCE-1600   Warfarin - Pharmacist Dosing Inpatient   Does not apply [facility] Pager [phone]

## 2021-10-10 NOTE — Progress Notes (Addendum)
Nutrition Follow-up  DOCUMENTATION CODES:   Severe malnutrition in context of chronic illness  INTERVENTION:   Ensure Enlive po TID, each supplement provides 350 kcal and 20 grams of protein OR  [employer] Farms 1.4 PO BID, each supplement provides 455 kcal and 20 grams protein.  Continue Renal MVI, continue to supplement phosphorus  Continue Regular diet-Pescetarian -Patient Services Management Team International aid/development worker) assist with meals  NUTRITION DIAGNOSIS:   Severe Malnutrition related to chronic illness (heart failure) as evidenced by severe fat depletion, severe muscle depletion.  Being addressed  GOAL:   Patient will meet greater than or equal to 90% of their needs  Progressing  MONITOR:   PO intake, Supplement acceptance, Labs, Weight trends  REASON FOR ASSESSMENT:   Consult LVAD Eval  ASSESSMENT:   71 yo male admitted with acute on chronic CHF. PMH includes CHF, CAD, CKD, HLD, HTN    12/23 Impella 5.5 placed 12/28 HM3 LVAD plaecd, Impella removed, Intubated 12/29 Extubated 12/31 FL diet 01/02 Diet advanced to 2g sodium 01/03 CRRT initiated 01/11 Calorie count indicating inadequate oral intake, Cortrak recommended 01/12 Thoracentesis with 1.3 L removed 01/13 Cortrak placed, TF initiated 01/19 Cortrak removed, TF stopped 01/20 CRRT on hold  Holding CRRT today with plan for iHD Continues to ambulate  Cortrak removed by MD yesterday with plans to replace today if pt did not eat.   Per RN, pt eating better. Yesterday ate chickpea and veggie sandwich with 2 boiled eggs brought in by wife for lunch; pt also ate dinner and drank 2 Ensure. This AM pt eating pancakes. Pt drinking [employer] shake this AM as well, as well as an Ensure with homemade soup at lunch.   Pt now eating some solid food which is definitely an improvement. OK to hold on Cortrak placement from my perspective at this time  Pt received chicken salad sandwich at lunch today, which he does not  eat. Pt actually ordered egg salad. Dining services notified and to address  Pressure injury healing  Labs: sodium 132 (L), phosphorus 2.5 (wdl) Meds: remeron, renal MVI, colace, aranesp  Diet Order:   Diet Order             Diet vegetarian Room service appropriate? Yes; Fluid consistency: Thin  Diet effective now                   EDUCATION NEEDS:   Education needs have been addressed  Skin:  Skin Assessment: Skin Integrity Issues: Skin Integrity Issues:: Incisions, Stage II Stage II: sacrum-now healing per RN Incisions: new LVAD  Last BM:  1/17  Height:   Ht Readings from Last 1 Encounters:  10/09/21 5\' 9"  (1.753 m)    Weight:   Wt Readings from Last 1 Encounters:  10/10/21 70.5 kg    Ideal Body Weight:     BMI:  Body mass index is 22.95 kg/m.  Estimated Nutritional Needs:   Kcal:  2300-2500 kcals  Protein:  135-160 g  Fluid:  1.8 L  ,Kerman Passey MS, RDN, LDN, CNSC Registered Dietitian III Clinical Nutrition RD Pager and On-Call Pager Number Located in [place]

## 2021-10-10 NOTE — Progress Notes (Signed)
[place] for Warfarin  Indication: LVAD/AF  Allergies  Allergen Reactions   Mushroom Extract Complex Nausea And Vomiting   Neosporin [Neomycin-Bacitracin Zn-Polymyx] Hives    Patient Measurements: Height: 5\' 9"  (175.3 cm) Weight: 70.5 kg (155 lb 6.8 oz) IBW/kg (Calculated) : 70.7 Heparin Dosing Weight: 83.7 kg  Vital Signs: Temp: 97.7 F (36.5 C) (01/20 0807) Temp Source: Oral (01/20 0807) BP: 87/65 (01/20 0700) Pulse Rate: 88 (01/20 0700)  Labs: Recent Labs    10/08/21 0316 10/08/21 0542 10/08/21 1658 10/09/21 0504 10/09/21 1552 10/10/21 0437  HGB 8.3*  --   --  8.9*  --  8.1*  HCT 26.9*  --   --  28.2*  --  25.8*  PLT 358  --   --  371  --  372  LABPROT 18.4* 18.3*  --  23.9*  --  23.7*  INR 1.5* 1.5*  --  2.1*  --  2.1*  HEPARINUNFRC 0.17* <0.10*  --  <0.10*  --   --   CREATININE 1.39*  --    < > 1.44* 1.53* 1.39*   < > = values in this interval not displayed.     Estimated Creatinine Clearance: 50 mL/min (A) (by C-G formula based on SCr of 1.39 mg/dL (H)).   Medical History: Past Medical History:  Diagnosis Date   Arrhythmia    atrial fibrillation   CHF (congestive heart failure) (HCC)    Chronic kidney disease    Coronary artery disease    Hyperlipidemia    Hypertension    Myocardial infarct [facility])     Assessment: 71 yo male on chronic Eliquis PTA for afib.  Now s/p LVAD placement 09/17/21, pharmacy asked to begin anticoagulation with warfarin.   INR  2.1 after boost doses - adjusted for Tube feeds - now removed - patient ate well last night and this am  - adjust doses cautiously based on oral intake  Patient previously therapeutic on lower doses 1-2mg  daily prior to tube feed being started for poor nutritional intake.    Hgb stable at 8, plt stable 300s and LDH stable in 200s. No s/sx of bleeding    Goal of Therapy:  INR 2-2.5 Monitor platelets by anticoagulation protocol: Yes   Plan:  -Warfarin 2 mg  PO x1 today -Daily INR, CBC, LDH,   Bonnita Nasuti Pharm.D. CPP, BCPS Clinical Pharmacist [phone] 10/10/2021 8:15 AM    Please check AMION for all [place] phone numbers After 10:00 PM, call [place] [phone]

## 2021-10-10 NOTE — Progress Notes (Signed)
Physical Therapy Treatment Patient Details Name: Isaac [name] MRN: [id number] DOB: 1950-11-23 Today's Date: 10/10/2021   History of Present Illness Pt is a 70 y.o. male admitted 09/07/21 with near-syncope, nausea, poor oral intake, generalized weakness. Workup for advanced heart failure and persistent inotropic with requirement, cardiogenic shock. [place] 12/19. Impella placed on 12/23. LVAD placed 12/28. Pt with post-op delirium and agitation. CRRT initiated 1/3. S/p thoracentesis 1/12. S/p cortrak placement 1/13. CRRT stopped 1/20.  PMH includes advanced CHF (LVEF <20%), AICD/PPM, PAF on Eliquis, CKD 3, HTN, HLD.    PT Comments    Focused session on amb to outside patio and standing tolerance while outside. Pt enjoyed the fresh air. Pt continues to need cues for switch to/from batteries. Continue to recommend inpatient rehab.    Recommendations for follow up therapy are one component of a multi-disciplinary discharge planning process, led by the attending physician.  Recommendations may be updated based on patient status, additional functional criteria and insurance authorization.  Follow Up Recommendations  Acute inpatient rehab (3hours/day)     Assistance Recommended at Discharge Frequent or constant Supervision/Assistance  Patient can return home with the following A little help with walking and/or transfers;A little help with bathing/dressing/bathroom;Assistance with cooking/housework;Assist for transportation;Help with stairs or ramp for entrance   Equipment Recommendations  Rollator (4 wheels);BSC/3in1    Recommendations for Other Services       Precautions / Restrictions Precautions Precautions: Fall;Sternal;Other (comment) Precaution Comments: LVAD Restrictions Other Position/Activity Restrictions: Sternal Precautions     Mobility  Bed Mobility Overal bed mobility: Needs Assistance Bed Mobility: Rolling, Sidelying to Sit Rolling: Min guard Sidelying to sit: Min  assist     Sit to sidelying: Min assist General bed mobility comments: Asssist to elevate trunk into sitting. Assist to bring legs back up into bed returning to supine    Transfers Overall transfer level: Needs assistance Equipment used:  (eva walker) Transfers: Sit to/from Stand Sit to Stand: Min assist           General transfer comment: Assist to bring hips up. Verbal cues for sternal precautions    Ambulation/Gait Ambulation/Gait assistance: Min assist Gait Distance (Feet): 90 Feet (amb outside to patio) Assistive device: Ethelene Hal Gait Pattern/deviations: Step-through pattern, Decreased stride length, Trunk flexed Gait velocity: decr Gait velocity interpretation: <1.31 ft/sec, indicative of household ambulator Pre-gait activities: Stood outside on patio x 10 minutes General Gait Details: Assist for stability. Verbal cues to stand more upright   Stairs             Wheelchair Mobility    Modified Rankin (Stroke Patients Only)       Balance Overall balance assessment: Needs assistance Sitting-balance support: No upper extremity supported, Feet supported Sitting balance-Leahy Scale: Fair     Standing balance support: No upper extremity supported, During functional activity Standing balance-Leahy Scale: Fair                              Cognition Arousal/Alertness: Awake/alert Behavior During Therapy: Flat affect Overall Cognitive Status: Impaired/Different from baseline Area of Impairment: Memory, Problem solving                     Memory: Decreased short-term memory, Decreased recall of precautions       Problem Solving: Slow processing, Requires verbal cues, Requires tactile cues          Exercises      General Comments  General comments (skin integrity, edema, etc.): VSS on 2L. Pt with nausea and vomiting after sitting up and started coughing. Nurse present      Pertinent Vitals/Pain Pain Assessment Pain  Assessment: No/denies pain    Home Living                          Prior Function            PT Goals (current goals can now be found in the care plan section) Progress towards PT goals: Progressing toward goals    Frequency    Min 3X/week      PT Plan Current plan remains appropriate    Co-evaluation              AM-PAC PT "6 Clicks" Mobility   Outcome Measure  Help needed turning from your back to your side while in a flat bed without using bedrails?: A Little Help needed moving from lying on your back to sitting on the side of a flat bed without using bedrails?: A Little Help needed moving to and from a bed to a chair (including a wheelchair)?: A Little Help needed standing up from a chair using your arms (e.g., wheelchair or bedside chair)?: A Lot Help needed to walk in hospital room?: A Little Help needed climbing 3-5 steps with a railing? : Total 6 Click Score: 15    End of Session Equipment Utilized During Treatment: Oxygen Activity Tolerance: Patient tolerated treatment well Patient left: in bed;with call bell/phone within reach;with nursing/sitter in room;with family/visitor present Nurse Communication: Mobility status (nurse present) PT Visit Diagnosis: Unsteadiness on feet (R26.81);Other abnormalities of gait and mobility (R26.89);Muscle weakness (generalized) (M62.81)     Time: 3491-7915 PT Time Calculation (min) (ACUTE ONLY): 31 min  Charges:  $Gait Training: 23-37 mins                     [place] Pager [phone] Office [place] 10/10/2021, 4:20 PM

## 2021-10-11 ENCOUNTER — Inpatient Hospital Stay (HOSPITAL_COMMUNITY): Payer: Medicare HMO

## 2021-10-11 DIAGNOSIS — R570 Cardiogenic shock: Secondary | ICD-10-CM | POA: Diagnosis not present

## 2021-10-11 DIAGNOSIS — Z95811 Presence of heart assist device: Secondary | ICD-10-CM | POA: Diagnosis not present

## 2021-10-11 LAB — CBC
HCT: 27.8 % — ABNORMAL LOW (ref 39.0–52.0)
Hemoglobin: 8.5 g/dL — ABNORMAL LOW (ref 13.0–17.0)
MCH: 29.9 pg (ref 26.0–34.0)
MCHC: 30.6 g/dL (ref 30.0–36.0)
MCV: 97.9 fL (ref 80.0–100.0)
Platelets: 509 10*3/uL — ABNORMAL HIGH (ref 150–400)
RBC: 2.84 MIL/uL — ABNORMAL LOW (ref 4.22–5.81)
RDW: 21.2 % — ABNORMAL HIGH (ref 11.5–15.5)
WBC: 13.4 10*3/uL — ABNORMAL HIGH (ref 4.0–10.5)
nRBC: 0 % (ref 0.0–0.2)

## 2021-10-11 LAB — RENAL FUNCTION PANEL
Albumin: 2 g/dL — ABNORMAL LOW (ref 3.5–5.0)
Albumin: 2 g/dL — ABNORMAL LOW (ref 3.5–5.0)
Anion gap: 6 (ref 5–15)
Anion gap: 11 (ref 5–15)
BUN: 33 mg/dL — ABNORMAL HIGH (ref 8–23)
BUN: 40 mg/dL — ABNORMAL HIGH (ref 8–23)
CO2: 27 mmol/L (ref 22–32)
CO2: 24 mmol/L (ref 22–32)
Calcium: 8.5 mg/dL — ABNORMAL LOW (ref 8.9–10.3)
Calcium: 8.8 mg/dL — ABNORMAL LOW (ref 8.9–10.3)
Chloride: 100 mmol/L (ref 98–111)
Chloride: 98 mmol/L (ref 98–111)
Creatinine, Ser: 2.58 mg/dL — ABNORMAL HIGH (ref 0.61–1.24)
Creatinine, Ser: 3.03 mg/dL — ABNORMAL HIGH (ref 0.61–1.24)
GFR, Estimated: 26 mL/min — ABNORMAL LOW (ref >60)
GFR, Estimated: 22 mL/min — ABNORMAL LOW (ref >60)
Glucose, Bld: 127 mg/dL — ABNORMAL HIGH (ref 70–99)
Glucose, Bld: 130 mg/dL — ABNORMAL HIGH (ref 70–99)
Phosphorus: 4.1 mg/dL (ref 2.5–4.6)
Phosphorus: 4.3 mg/dL (ref 2.5–4.6)
Potassium: 6.1 mmol/L — ABNORMAL HIGH (ref 3.5–5.1)
Potassium: 5.3 mmol/L — ABNORMAL HIGH (ref 3.5–5.1)
Sodium: 133 mmol/L — ABNORMAL LOW (ref 135–145)
Sodium: 133 mmol/L — ABNORMAL LOW (ref 135–145)

## 2021-10-11 LAB — COOXEMETRY PANEL
Carboxyhemoglobin: 2.2 % — ABNORMAL HIGH (ref 0.5–1.5)
Methemoglobin: 0.9 % (ref 0.0–1.5)
O2 Saturation: 75.2 %
Total hemoglobin: 8.1 g/dL — ABNORMAL LOW (ref 12.0–16.0)

## 2021-10-11 LAB — MAGNESIUM: Magnesium: 2.5 mg/dL — ABNORMAL HIGH (ref 1.7–2.4)

## 2021-10-11 LAB — PROTIME-INR
INR: 2 — ABNORMAL HIGH (ref 0.8–1.2)
Prothrombin Time: 22.5 seconds — ABNORMAL HIGH (ref 11.4–15.2)

## 2021-10-11 LAB — GLUCOSE, CAPILLARY
Glucose-Capillary: 104 mg/dL — ABNORMAL HIGH (ref 70–99)
Glucose-Capillary: 106 mg/dL — ABNORMAL HIGH (ref 70–99)
Glucose-Capillary: 116 mg/dL — ABNORMAL HIGH (ref 70–99)
Glucose-Capillary: 87 mg/dL (ref 70–99)
Glucose-Capillary: 97 mg/dL (ref 70–99)
Glucose-Capillary: 99 mg/dL (ref 70–99)

## 2021-10-11 LAB — LACTATE DEHYDROGENASE: LDH: 280 U/L — ABNORMAL HIGH (ref 98–192)

## 2021-10-11 MED ORDER — WARFARIN SODIUM 2 MG PO TABS
2.0000 mg | ORAL_TABLET | Freq: Once | ORAL | Status: AC
  Administered 2021-10-11: 2 mg via ORAL
  Filled 2021-10-11: qty 1

## 2021-10-11 MED ORDER — SODIUM ZIRCONIUM CYCLOSILICATE 10 G PO PACK
10.0000 g | PACK | Freq: Two times a day (BID) | ORAL | Status: AC
  Administered 2021-10-11 (×2): 10 g via ORAL
  Filled 2021-10-11 (×2): qty 1

## 2021-10-11 NOTE — Plan of Care (Signed)
°  Problem: Health Behavior/Discharge Planning: Goal: Ability to manage health-related needs will improve Outcome: Progressing   Problem: Clinical Measurements: Goal: Ability to maintain clinical measurements within normal limits will improve Outcome: Progressing Goal: Will remain free from infection Outcome: Progressing Goal: Diagnostic test results will improve Outcome: Progressing Goal: Respiratory complications will improve Outcome: Progressing Goal: Cardiovascular complication will be avoided Outcome: Progressing   Problem: Activity: Goal: Risk for activity intolerance will decrease Outcome: Progressing   Problem: Nutrition: Goal: Adequate nutrition will be maintained Outcome: Progressing   Problem: Coping: Goal: Level of anxiety will decrease Outcome: Progressing   Problem: Elimination: Goal: Will not experience complications related to bowel motility Outcome: Progressing Goal: Will not experience complications related to urinary retention Outcome: Progressing   Problem: Pain Managment: Goal: General experience of comfort will improve Outcome: Progressing   Problem: Safety: Goal: Ability to remain free from injury will improve Outcome: Progressing   Problem: Skin Integrity: Goal: Risk for impaired skin integrity will decrease Outcome: Progressing   Problem: Education: Goal: Ability to demonstrate management of disease process will improve Outcome: Progressing Goal: Ability to verbalize understanding of medication therapies will improve Outcome: Progressing Goal: Individualized Educational Video(s) Outcome: Progressing   Problem: Activity: Goal: Capacity to carry out activities will improve Outcome: Progressing   Problem: Cardiac: Goal: Ability to achieve and maintain adequate cardiopulmonary perfusion will improve Outcome: Progressing   Problem: Education: Goal: Knowledge of the prescribed therapeutic regimen will improve Outcome: Progressing    Problem: Activity: Goal: Risk for activity intolerance will decrease Outcome: Progressing   Problem: Cardiac: Goal: Ability to maintain an adequate cardiac output will improve Outcome: Progressing   Problem: Coping: Goal: Level of anxiety will decrease Outcome: Progressing   Problem: Fluid Volume: Goal: Risk for excess fluid volume will decrease Outcome: Progressing   Problem: Clinical Measurements: Goal: Ability to maintain clinical measurements within normal limits will improve Outcome: Progressing Goal: Will remain free from infection Outcome: Progressing   Problem: Respiratory: Goal: Will regain and/or maintain adequate ventilation Outcome: Progressing   Problem: Education: Goal: Knowledge of cardiac device and self-care will improve Outcome: Progressing Goal: Ability to safely manage health related needs after discharge will improve Outcome: Progressing Goal: Individualized Educational Video(s) Outcome: Progressing   Problem: Cardiac: Goal: Ability to achieve and maintain adequate cardiopulmonary perfusion will improve Outcome: Progressing

## 2021-10-11 NOTE — Progress Notes (Addendum)
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 Acute component either from cardiorenal +/- ATN with decreased renal function from hemodynamics and CPB + hypotensive episodes + pressors.  - CRRT started on 1/3 - stopped mid day 1/20. Plan was to hold over weekend and attempt iHD Monday. ^ K may require sooner HD - will treat medically but if persistently elevated will need HD.  K being repeated at noon.  Also watch volume daily.  -Cont monitoring daily I/Os + weights  -Maintain MAP>65 for optimal renal perfusion.  -Avoid nephrotoxic medications including NSAIDs, contrast. - Dose medications for a GFR <15 ml/min and also on CRRT.   Cardiogenic shock: s/p LVAD 09/17/22 (HM 3).   - on milrinone and midodrine now, off NE now  CAD with h/o PCI last in 2013  Anemia- iron indices 1/16 showing iron sat 12%, ferritin 477, Aranesp 10/06/21. Giving IV iron x 1g over 5 doses.   Afib- on amio  Acute hypoxic RF: s/p thoracentesis 1.3 L 10/02/21 Hyperkalemia:  K 6.1 this AM, giving lokelma 10 BID today; recheck noon.  If unable to manage medically will need HD.  He's vegetarian/pescatarian and has really struggled to maintain nutrition during admission - for now won't formally restrict diet but may need to revisit  Dispo: ICU, ultimately will need to be able to tolerate iHD, moving in that direction now  Subjective:    No major new issues.   I/os yesterday 981/133 +850 CRRT off yesterday mid afternoon.  4am labs with K 6.1 - lokelma given    Objective:   BP 103/73    Pulse 65    Temp 97.9 F (36.6 C) (Axillary)    Resp 18    Ht 5\' 9"  (1.753 m)    Wt 72.7 kg    SpO2 93%    BMI 23.67 kg/m   Intake/Output Summary (Last 24 hours) at 10/11/2021 1018 Last data filed at 10/11/2021 0800 Gross per 24 hour  Intake 943.6 ml  Output 114 ml  Net 829.6 ml    Weight change: 2.2 kg  Physical Exam: GEN: NAD, A&Ox3  sitting in bed HEENT: wearing glasses LUNGS: normal WOB, clear ant CV: LVAD  EXT: minimal edema, much improved, legs wrapped at tibial level ACCESS: rt IJ TC c/d/i   Imaging: No results found.  Labs: BMET Recent Labs  Lab 10/08/21 0316 10/08/21 1658 10/09/21 0504 10/09/21 1552 10/10/21 0437 10/10/21 1714 10/11/21 0421  NA 133* 135 137 137 135 134* 133*  K 4.8 4.8 5.3* 4.7 5.1 5.3* 6.1*  CL 100 98 100 101 99 102 100  CO2 26 27 27 26 27 26 27   GLUCOSE 160* 130* 109* 106* 132* 114* 127*  BUN 28* 27* 30* 33* 25* 25* 33*  CREATININE 1.39* 1.37* 1.44* 1.53* 1.39* 1.78* 2.58*  CALCIUM 8.0* 8.0* 8.6* 7.5* 7.2* 8.1* 8.5*  PHOS 1.5* 2.1* 1.7* 4.1 2.5 2.6 4.1    CBC Recent Labs  Lab 10/08/21 0316 10/09/21 0504 10/10/21 0437 10/11/21 0421  WBC 12.4* 12.8* 11.6* 13.4*  HGB 8.3* 8.9* 8.1* 8.5*  HCT 26.9* 28.2* 25.8* 27.8*  MCV 94.7 92.8 96.3 97.9  PLT 358 371 372 509*     Medications:     sodium chloride   Intravenous Once   sodium chloride   Intravenous Once   sodium chloride   Intravenous Once   sodium chloride  Intravenous Once   amiodarone  200 mg Oral BID   aspirin  81 mg Oral Daily   Chlorhexidine Gluconate Cloth  6 each Topical Daily   darbepoetin (ARANESP) injection - NON-DIALYSIS  60 mcg Subcutaneous Q Mon-1800   docusate sodium  100 mg Oral BID   feeding supplement  237 mL Oral TID BM   feeding supplement (KATE FARMS STANDARD 1.4)  325 mL Oral TID BM   mouth rinse  15 mL Mouth Rinse BID   midodrine  20 mg Oral TID WC   mirtazapine  7.5 mg Oral QHS   montelukast  5 mg Oral QHS   multivitamin  1 tablet Oral QHS   pantoprazole  40 mg Oral Daily   rosuvastatin  10 mg Oral Daily   senna  2 tablet Oral QHS   sodium chloride flush  10-40 mL Intracatheter Q12H   sodium chloride flush  3 mL Intravenous Q12H   sodium zirconium cyclosilicate  10 g Oral BID   Warfarin - Pharmacist Dosing Inpatient   Does not apply [place] MD [facility] Pager [phone]

## 2021-10-11 NOTE — Progress Notes (Signed)
Patient ID: Isaac [name], male   DOB: 09-Apr-1951, 71 y.o.   MRN: [id number]    Advanced Heart Failure VAD Team Note  PCP-Cardiologist: None   Subjective:    12/19 RHC- RA 7, PA 39/14 (25), PCWP 11, CO 6.4 CI 3. Thermo 3.5 1.8. Lasix drip stopped after cath. 12/20 Swan removed.  Norepi 3 mcg added.  12/21 Fever. Blood CX drawn. UA + leukocytes. Started on vanc and cefepime. Diuresed with IV lasix + metolazone.  12/23 Impella 5.5 placed 12/28 HM III LVAD, ICD leads plastered to tricuspid valve with severe TR, valve not replaced and some improvement in TR with LVAD placement.  12/29 Extubated 01/03 Started CRRT 01/07  L thoracentesis and placement of [facility] 01/09 LVAD speed increased to 5600 rpm 01/10 Milrinone stopped. Amio gtt>>PO. Completed 5 day course Cefepime.  01/12 Ramp echo done: Moderate lateral pericardial effusion w/o tamponade, RV larger with severe dilation/moderate dysfunction and left shift of the septum, LV small and undershifted.  TR severe.  LVAD speed decreased to 5000 rpms at 2 different sessions.  Also had thoracentesis on left with 1.3 L off.  01/13 TFs begun 01/16 echo reviewed: EF 25-30%, LV larger than prior but septum still with left shift, RV moderately dilated and moderately dysfunctional, moderate-severe TR, mod-large localized pericardial effusion lateral to the LV.  1/18 Ramp Echo with speed decreased to 4900  1/12 CVVHD stopped  He remains on milrinone 0.375. NE off. CO-OX 75%  MAPs 80s  CVVHD stopped yesterday. SCr 1.78 -> 2.58  K 5.3 -> 6.1  INR 2.0  Receiving IV iron X 5 days Hgb 8.5   LVAD INTERROGATION:  HeartMate III LVAD:   Flow 3.8  liters/min, speed 4900, power 3.0 PI  3.4  VAD interrogated personally. Parameters stable.      Objective:    Vital Signs:   Temp:  [97.8 F (36.6 C)-99.7 F (37.6 C)] 97.9 F (36.6 C) (01/21 0813) Pulse Rate:  [58-75] 60 (01/21 1000) Resp:  [18-20] 18 (01/21 0400) BP: (81-112)/(67-100) 103/72 (01/21  1000) SpO2:  [90 %-99 %] 90 % (01/21 1000) Weight:  [72.7 kg] 72.7 kg (01/21 0500) Last BM Date: 10/11/21 Mean arterial Pressure 70s-80s   Intake/Output:   Intake/Output Summary (Last 24 hours) at 10/11/2021 1105 Last data filed at 10/11/2021 0800 Gross per 24 hour  Intake 609.55 ml  Output 33 ml  Net 576.55 ml      Physical Exam   General:  NAD.  HEENT: normal  Neck: supple. JVP not elevated.  Carotids 2+ bilat; no bruits. No lymphadenopathy or thryomegaly appreciated. Cor: LVAD hum.  Lungs: Clear. Abdomen: obese soft, nontender, non-distended. No hepatosplenomegaly. No bruits or masses. Good bowel sounds. Driveline site clean. Anchor in place.  Extremities: no cyanosis, clubbing, rash. Warm no edema  Neuro: alert & oriented x 3. No focal deficits. Moves all 4 without problem   Telemetry   A/V pacing, 60 (personally reviewed)  Labs   Basic Metabolic Panel: Recent Labs  Lab 10/07/21 0411 10/07/21 1626 10/08/21 0316 10/08/21 1658 10/09/21 0504 10/09/21 1552 10/10/21 0437 10/10/21 1714 10/11/21 0421  NA 135   < > 133*   < > 137 137 135 134* 133*  K 4.8   < > 4.8   < > 5.3* 4.7 5.1 5.3* 6.1*  CL 101   < > 100   < > 100 101 99 102 100  CO2 26   < > 26   < > 27 26  27 26 27   GLUCOSE 116*   < > 160*   < > 109* 106* 132* 114* 127*  BUN 27*   < > 28*   < > 30* 33* 25* 25* 33*  CREATININE 1.57*   < > 1.39*   < > 1.44* 1.53* 1.39* 1.78* 2.58*  CALCIUM 8.1*   < > 8.0*   < > 8.6* 7.5* 7.2* 8.1* 8.5*  MG 2.2  --  2.3  --  2.4  --  2.5*  --  2.5*  PHOS 1.9*   < > 1.5*   < > 1.7* 4.1 2.5 2.6 4.1   < > = values in this interval not displayed.     Liver Function Tests: Recent Labs  Lab 10/09/21 0504 10/09/21 1552 10/10/21 0437 10/10/21 1714 10/11/21 0421  ALBUMIN 2.0* 1.9* 1.9* 1.9* 2.0*    No results for input(s): LIPASE, AMYLASE in the last 168 hours. No results for input(s): AMMONIA in the last 168 hours.   CBC: Recent Labs  Lab 10/07/21 0814  10/08/21 0316 10/09/21 0504 10/10/21 0437 10/11/21 0421  WBC 11.1* 12.4* 12.8* 11.6* 13.4*  HGB 8.5* 8.3* 8.9* 8.1* 8.5*  HCT 26.5* 26.9* 28.2* 25.8* 27.8*  MCV 92.0 94.7 92.8 96.3 97.9  PLT 375 358 371 372 509*     INR: Recent Labs  Lab 10/08/21 0316 10/08/21 0542 10/09/21 0504 10/10/21 0437 10/11/21 0421  INR 1.5* 1.5* 2.1* 2.1* 2.0*     Other results:   Imaging   No results found.   Medications:     Scheduled Medications:  sodium chloride   Intravenous Once   sodium chloride   Intravenous Once   sodium chloride   Intravenous Once   sodium chloride   Intravenous Once   amiodarone  200 mg Oral BID   aspirin  81 mg Oral Daily   Chlorhexidine Gluconate Cloth  6 each Topical Daily   darbepoetin (ARANESP) injection - NON-DIALYSIS  60 mcg Subcutaneous Q Mon-1800   docusate sodium  100 mg Oral BID   feeding supplement  237 mL Oral TID BM   feeding supplement (KATE FARMS STANDARD 1.4)  325 mL Oral TID BM   mouth rinse  15 mL Mouth Rinse BID   midodrine  20 mg Oral TID WC   mirtazapine  7.5 mg Oral QHS   montelukast  5 mg Oral QHS   multivitamin  1 tablet Oral QHS   pantoprazole  40 mg Oral Daily   rosuvastatin  10 mg Oral Daily   senna  2 tablet Oral QHS   sodium chloride flush  10-40 mL Intracatheter Q12H   sodium chloride flush  3 mL Intravenous Q12H   sodium zirconium cyclosilicate  10 g Oral BID   Warfarin - Pharmacist Dosing Inpatient   Does not apply q1600    Infusions:   prismasol BGK 4/2.5 400 mL/hr at 10/10/21 0125    prismasol BGK 4/2.5 200 mL/hr at 10/10/21 0132   sodium chloride Stopped (09/20/21 0304)   sodium chloride Stopped (09/18/21 0803)   sodium chloride Stopped (10/02/21 0900)   milrinone 0.375 mcg/kg/min (10/11/21 0821)   norepinephrine (LEVOPHED) Adult infusion Stopped (10/09/21 1206)   prismasol BGK 4/2.5 1,800 mL/hr at 10/10/21 1355   promethazine (PHENERGAN) injection (IM or IVPB) Stopped (10/10/21 1755)    PRN  Medications: sodium chloride, sodium chloride, acetaminophen, albuterol, Gerhardt's butt cream, heparin, heparin, lip balm, ondansetron (ZOFRAN) IV, oxyCODONE, polyethylene glycol, promethazine (PHENERGAN) injection (IM or IVPB), sodium  chloride flush, sodium chloride flush, traMADol   Assessment/Plan:    1. Acute on chronic systolic CHF:  Long-standing cardiomyopathy.  [place].  Echo this admission with EF < 20%, severe LV dilation, restrictive diastolic function, moderate RV dysfunction, moderate MR, mod-severe TR. Cause of cardiomyopathy is uncertain.  He has a history of CAD, but I do not think that the described CAD from the past could explain his cardiomyopathy, but CAD could have progressed.  With difficulty tolerating GDMT/need for midodrine and cardiorenal syndrome as well as profound volume overload + NYHA class IV symptoms,  concerned for low output HF. Co-ox off milrinone was 36%, milrinone started and increased to 0.375 but CO remained low. NE added and Impella 5.5 placed 12/23. EF 10% on TEE 12/23. s/p HM III VAD on 12/28.  Ramp echo 01/03 with EF 20-25%, RV moderately reduced, septum mid-line.  Ramp echo again on 1/12 showed severely dilated/moderately dysfunctional RV with severe TR and left-shifted septum with small LV.  Speed decreased on 1/12 to 5000 rpm over 2 sessions due to RV failure and milrinone 0.25 + NE 3 restarted.  Echo repeated on 1/16, RV looks better and LV larger but still with left shift of the septum.  Able to increase CVVH, weight down significantly.  Echo again on 1/18, speed decreased to 4900 with RV mildly dysfunctional and severely enlarged with severe TR.  CVVHD stopped 1/20  - MAP 80s today, CVP 11 with co-ox 75%. On milrinone 0.375, NE now off. Will decrease milrinone to 0.25 - Now off CRRT volume stable. Plan was for [facility] Monday but may need to restart CVVHD for potassium management  - Continue midodrine 20 mg tid.   - He does not have BP room for  sildenafil.  - Warfarin for INR 2-2.5.  INR 2.0 today - Continue 81 mg aspirin - Continue UNNA boots 2. Tricuspid regurgitation:  Tricuspid repair not done at time of VAD d/t proximity of ICD wires and hypotension during surgery.  He has severe TR.  3. Atrial fibrillation: Persistent since 10/22 based on device interrogation.  Rate controlled.   - Continue PO amio 200 bid  4. AKI on CKD stage 3: Gradual rise (was about 1.7 pre-op) since surgery.  He has not been hypotensive post-op, suspect intra-op hypotension led to development of ATN => urine sediment looked like ATN per renal. Now on CVVH. TDC placed 1/7.  - Maintain MAP and CO as above - Now off CRRT volume stable. Plan was for [facility] Monday but may need to restart CVVHD for potassium management  - Appreciate Nephrology input 5. CAD: History of PCI to OM1 in 2007 and RCA in 2013.  No CP or ACs.  - Deferred cardiac cath due to AKI and plan for VAD - No s/s angina - Continue Crestor.  6. ID: Had fever 12/21, PCT 1.03.  Blood cultures negative. ?Phlebitis at IV site. Post-op fever with elevated WBCs, PCT 2.63 -> 1.78.  He completed course of cefepime. Afebrile with stable WBCs.   7. Constipation: Resolved.  8. Hyponatremia: Hypervolemic hyponatremia.  Controlled via CVVH.  9. Anemia: 1 unit PRBCs on 1/12,  1 unit PRBs 1/13, 1 unit 1/16. No overt bleeding, ?losses via CVVH.  Hgb 8.5 today.  - Receiving IV iron per nephrology 10. Confusion/delirium: Initially post-op and started on Precedex.  Resolved.  11. F/E/N: TFs via [place] for poor nutritional status.  Improving po intake.  - TF off. Ate well yesterday. Continue to monitor. -  Prealbumin 7.8>9.5.  - Added remeron to help with appetite.  12. Left pleural effusion: - s/p thoracentesis 1/7 - s/p thoracentesis 1/12. 13. Pericardial effusion: Stable moderate pericardial effusion, primarily localized to along the lateral wall of the LV.  Does not appear to be affecting the RV/doubt  tamponade.  Discussed after 1/12 and 1/18 echo with Dr. Cyndia Bent, conservative management for now.  14. Right great toe pain: - ? Possible gout.  - Uric acid 13.3 -> 1.6 (?) 15. Hyperkalemia - Management per Renal - Receiving Lokelma - Recheck at noon. May need to restart CRRT  Will eventually need acute inpatient rehab.   I reviewed the LVAD parameters from today, and compared the results to the patient's prior recorded data.  No programming changes were made.  The LVAD is functioning within specified parameters.  The patient performs LVAD self-test daily.  LVAD interrogation was negative for any significant power changes, alarms or PI events/speed drops.  LVAD equipment check completed and is in good working order.  Back-up equipment present.   LVAD education done on emergency procedures and precautions and reviewed exit site care.  Glori Bickers, MD  11:15 AM

## 2021-10-11 NOTE — Progress Notes (Signed)
Isaac [name] for Warfarin  Indication: LVAD/AF  Allergies  Allergen Reactions   Mushroom Extract Complex Nausea And Vomiting   Neosporin [Neomycin-Bacitracin Zn-Polymyx] Hives    Patient Measurements: Height: 5\' 9"  (175.3 cm) Weight: 72.7 kg (160 lb 4.4 oz) IBW/kg (Calculated) : 70.7 Heparin Dosing Weight: 83.7 kg  Vital Signs: Temp: 97.9 F (36.6 C) (01/21 0813) Temp Source: Axillary (01/21 0813) BP: 103/72 (01/21 1000) Pulse Rate: 60 (01/21 1000)  Labs: Recent Labs    10/09/21 0504 10/09/21 1552 10/10/21 0437 10/10/21 1714 10/11/21 0421  HGB 8.9*  --  8.1*  --  8.5*  HCT 28.2*  --  25.8*  --  27.8*  PLT 371  --  372  --  509*  LABPROT 23.9*  --  23.7*  --  22.5*  INR 2.1*  --  2.1*  --  2.0*  HEPARINUNFRC <0.10*  --   --   --   --   CREATININE 1.44*   < > 1.39* 1.78* 2.58*   < > = values in this interval not displayed.     Estimated Creatinine Clearance: 27 mL/min (A) (by C-G formula based on SCr of 2.58 mg/dL (H)).   Medical History: Past Medical History:  Diagnosis Date   Arrhythmia    atrial fibrillation   CHF (congestive heart failure) (HCC)    Chronic kidney disease    Coronary artery disease    Hyperlipidemia    Hypertension    [facility]ocardial infarct [facility])     Assessment: 71 yo male on chronic Eliquis PTA for afib.  Now s/p LVAD placement 09/17/21, pharmacy asked to begin anticoagulation with warfarin.   Patient previously therapeutic on lower doses 1-2mg  daily prior to tube feed being started for poor nutritional intake.   INR 2. Hgb stable at 8s, plt increase to 509, LDH stable in 200s. No s/sx of bleeding. Oral intake documented at 100% + 2 doses of ensure yesterday - not as much appetite today when talking with patient.   Goal of Therapy:  INR 2-2.5 Monitor platelets by anticoagulation protocol: Yes   Plan:  -Warfarin 2 mg PO x1 today -Daily INR, CBC, LDH  Antonietta Jewel, PharmD, [place] Clinical  Pharmacist  Phone: [phone] 10/11/2021 11:08 AM  Please check AMION for all [place] phone numbers After 10:00 PM, call [place] [phone]

## 2021-10-12 DIAGNOSIS — Z95811 Presence of heart assist device: Secondary | ICD-10-CM | POA: Diagnosis not present

## 2021-10-12 DIAGNOSIS — I5043 Acute on chronic combined systolic (congestive) and diastolic (congestive) heart failure: Secondary | ICD-10-CM | POA: Diagnosis not present

## 2021-10-12 LAB — RENAL FUNCTION PANEL
Albumin: 1.9 g/dL — ABNORMAL LOW (ref 3.5–5.0)
Anion gap: 11 (ref 5–15)
BUN: 49 mg/dL — ABNORMAL HIGH (ref 8–23)
CO2: 24 mmol/L (ref 22–32)
Calcium: 8.6 mg/dL — ABNORMAL LOW (ref 8.9–10.3)
Chloride: 97 mmol/L — ABNORMAL LOW (ref 98–111)
Creatinine, Ser: 3.91 mg/dL — ABNORMAL HIGH (ref 0.61–1.24)
GFR, Estimated: 16 mL/min — ABNORMAL LOW (ref >60)
Glucose, Bld: 121 mg/dL — ABNORMAL HIGH (ref 70–99)
Phosphorus: 5.5 mg/dL — ABNORMAL HIGH (ref 2.5–4.6)
Potassium: 5.5 mmol/L — ABNORMAL HIGH (ref 3.5–5.1)
Sodium: 132 mmol/L — ABNORMAL LOW (ref 135–145)

## 2021-10-12 LAB — CBC
HCT: 22.7 % — ABNORMAL LOW (ref 39.0–52.0)
HCT: 26.5 % — ABNORMAL LOW (ref 39.0–52.0)
Hemoglobin: 7.1 g/dL — ABNORMAL LOW (ref 13.0–17.0)
Hemoglobin: 8.2 g/dL — ABNORMAL LOW (ref 13.0–17.0)
MCH: 30.3 pg (ref 26.0–34.0)
MCH: 29.6 pg (ref 26.0–34.0)
MCHC: 31.3 g/dL (ref 30.0–36.0)
MCHC: 30.9 g/dL (ref 30.0–36.0)
MCV: 97 fL (ref 80.0–100.0)
MCV: 95.7 fL (ref 80.0–100.0)
Platelets: 576 10*3/uL — ABNORMAL HIGH (ref 150–400)
Platelets: 539 10*3/uL — ABNORMAL HIGH (ref 150–400)
RBC: 2.34 MIL/uL — ABNORMAL LOW (ref 4.22–5.81)
RBC: 2.77 MIL/uL — ABNORMAL LOW (ref 4.22–5.81)
RDW: 20.6 % — ABNORMAL HIGH (ref 11.5–15.5)
RDW: 20.5 % — ABNORMAL HIGH (ref 11.5–15.5)
WBC: 15.9 10*3/uL — ABNORMAL HIGH (ref 4.0–10.5)
WBC: 15 10*3/uL — ABNORMAL HIGH (ref 4.0–10.5)
nRBC: 0 % (ref 0.0–0.2)
nRBC: 0 % (ref 0.0–0.2)

## 2021-10-12 LAB — GLUCOSE, CAPILLARY
Glucose-Capillary: 103 mg/dL — ABNORMAL HIGH (ref 70–99)
Glucose-Capillary: 106 mg/dL — ABNORMAL HIGH (ref 70–99)
Glucose-Capillary: 115 mg/dL — ABNORMAL HIGH (ref 70–99)
Glucose-Capillary: 77 mg/dL (ref 70–99)
Glucose-Capillary: 93 mg/dL (ref 70–99)
Glucose-Capillary: 95 mg/dL (ref 70–99)
Glucose-Capillary: 96 mg/dL (ref 70–99)

## 2021-10-12 LAB — LACTATE DEHYDROGENASE: LDH: 222 U/L — ABNORMAL HIGH (ref 98–192)

## 2021-10-12 LAB — COOXEMETRY PANEL
Carboxyhemoglobin: 2.1 % — ABNORMAL HIGH (ref 0.5–1.5)
Methemoglobin: 0.8 % (ref 0.0–1.5)
O2 Saturation: 69 %
Total hemoglobin: 8.3 g/dL — ABNORMAL LOW (ref 12.0–16.0)

## 2021-10-12 LAB — PROTIME-INR
INR: 2.4 — ABNORMAL HIGH (ref 0.8–1.2)
Prothrombin Time: 26 seconds — ABNORMAL HIGH (ref 11.4–15.2)

## 2021-10-12 LAB — MAGNESIUM: Magnesium: 2.6 mg/dL — ABNORMAL HIGH (ref 1.7–2.4)

## 2021-10-12 MED ORDER — WARFARIN 0.5 MG HALF TABLET
0.5000 mg | ORAL_TABLET | Freq: Once | ORAL | Status: AC
  Administered 2021-10-12: 0.5 mg via ORAL
  Filled 2021-10-12: qty 1

## 2021-10-12 MED ORDER — SODIUM ZIRCONIUM CYCLOSILICATE 10 G PO PACK
10.0000 g | PACK | Freq: Two times a day (BID) | ORAL | Status: DC
  Administered 2021-10-12 – 2021-10-14 (×6): 10 g via ORAL
  Filled 2021-10-12 (×7): qty 1

## 2021-10-12 MED ORDER — CHLORHEXIDINE GLUCONATE CLOTH 2 % EX PADS
6.0000 | MEDICATED_PAD | Freq: Every day | CUTANEOUS | Status: DC
  Administered 2021-10-12 – 2021-10-15 (×3): 6 via TOPICAL

## 2021-10-12 NOTE — Progress Notes (Signed)
[place] for Warfarin  Indication: LVAD/AF  Allergies  Allergen Reactions   Mushroom Extract Complex Nausea And Vomiting   Neosporin [Neomycin-Bacitracin Zn-Polymyx] Hives    Patient Measurements: Height: 5\' 9"  (175.3 cm) Weight: 73.4 kg (161 lb 13.1 oz) IBW/kg (Calculated) : 70.7 Heparin Dosing Weight: 83.7 kg  Vital Signs: Temp: 98.7 F (37.1 C) (01/22 0402) Temp Source: Oral (01/22 0402) BP: 138/101 (01/22 0700) Pulse Rate: 62 (01/22 0700)  Labs: Recent Labs    10/10/21 0437 10/10/21 1714 10/11/21 0421 10/11/21 1200 10/12/21 0447  HGB 8.1*  --  8.5*  --  7.1*  HCT 25.8*  --  27.8*  --  22.7*  PLT 372  --  509*  --  576*  LABPROT 23.7*  --  22.5*  --  26.0*  INR 2.1*  --  2.0*  --  2.4*  CREATININE 1.39*   < > 2.58* 3.03* 3.91*   < > = values in this interval not displayed.     Estimated Creatinine Clearance: 17.8 mL/min (A) (by C-G formula based on SCr of 3.91 mg/dL (H)).   Medical History: Past Medical History:  Diagnosis Date   Arrhythmia    atrial fibrillation   CHF (congestive heart failure) (HCC)    Chronic kidney disease    Coronary artery disease    Hyperlipidemia    Hypertension    Myocardial infarct [facility])     Assessment: 71 yo male on chronic Eliquis PTA for afib.  Now s/p LVAD placement 09/17/21, pharmacy asked to begin anticoagulation with warfarin.   Patient previously therapeutic on lower doses 1-2mg  daily prior to tube feed being started for poor nutritional intake.   INR increased up to 2.4. Hgb at 8.2, plt increase to 539, LDH stable in 200s. No s/sx of bleeding. Oral intake documented at 10% at one documented meal, took 1 dose of ensure, did have protein shake from family yesterday - lower appetite endorsed by patient.   Goal of Therapy:  INR 2-2.5 Monitor platelets by anticoagulation protocol: Yes   Plan:  -Warfarin 0.5 mg PO x1 today -Daily INR, CBC, LDH  Antonietta Jewel, PharmD,  [place] Clinical Pharmacist  Phone: [phone] 10/12/2021 7:46 AM  Please check AMION for all [place] phone numbers After 10:00 PM, call [place] [phone]

## 2021-10-12 NOTE — Progress Notes (Signed)
Patient ID: Isaac [name], male   DOB: 01/08/1951, 71 y.o.   MRN: [id number]    Advanced Heart Failure VAD Team Note  PCP-Cardiologist: None   Subjective:    12/19 RHC- RA 7, PA 39/14 (25), PCWP 11, CO 6.4 CI 3. Thermo 3.5 1.8. Lasix drip stopped after cath. 12/20 Swan removed.  Norepi 3 mcg added.  12/21 Fever. Blood CX drawn. UA + leukocytes. Started on vanc and cefepime. Diuresed with IV lasix + metolazone.  12/23 Impella 5.5 placed 12/28 HM III LVAD, ICD leads plastered to tricuspid valve with severe TR, valve not replaced and some improvement in TR with LVAD placement.  12/29 Extubated 01/03 Started CRRT 01/07  L thoracentesis and placement of [facility] 01/09 LVAD speed increased to 5600 rpm 01/10 Milrinone stopped. Amio gtt>>PO. Completed 5 day course Cefepime.  01/12 Ramp echo done: Moderate lateral pericardial effusion w/o tamponade, RV larger with severe dilation/moderate dysfunction and left shift of the septum, LV small and undershifted.  TR severe.  LVAD speed decreased to 5000 rpms at 2 different sessions.  Also had thoracentesis on left with 1.3 L off.  01/13 TFs begun 01/16 echo reviewed: EF 25-30%, LV larger than prior but septum still with left shift, RV moderately dilated and moderately dysfunctional, moderate-severe TR, mod-large localized pericardial effusion lateral to the LV.  1/18 Ramp Echo with speed decreased to 4900  1/12 CVVHD stopped  Milrinone decreased 0.375 -> 0.25 yesterday. Co-ox 69%   CVVHD stopped 1/20. SCr 1.78 -> 2.58  -> 3.91 No urine output  K 5.5 INR 2.4  Received IV iron X 5 days (completed 1/20)  Hgb 8.5 -> 7.1 (was 8.3 on co-ox)  Denies CP, SOB, orthopnea or PND.     LVAD INTERROGATION:  HeartMate III LVAD:   Flow 3.8  liters/min, speed 4900, power 3.0 PI  4.7  VAD interrogated personally. Parameters stable.     Objective:    Vital Signs:   Temp:  [98.1 F (36.7 C)-99.1 F (37.3 C)] 98.1 F (36.7 C) (01/22 0747) Pulse Rate:   [56-79] 79 (01/22 0800) Resp:  [11-29] 15 (01/22 0800) BP: (90-138)/(69-101) 101/77 (01/22 0800) SpO2:  [89 %-99 %] 92 % (01/22 0800) Weight:  [73.4 kg] 73.4 kg (01/22 0500) Last BM Date: 10/11/21 Mean arterial Pressure 70s-80s   Intake/Output:   Intake/Output Summary (Last 24 hours) at 10/12/2021 0907 Last data filed at 10/12/2021 0800 Gross per 24 hour  Intake 336.46 ml  Output --  Net 336.46 ml      Physical Exam   General:  NAD.  HEENT: normal  Neck: supple. JVP to jaw.  Carotids 2+ bilat; no bruits. No lymphadenopathy or thryomegaly appreciated. Cor: LVAD hum.  Lungs: Clear. Abdomen: obese soft, nontender, non-distended. No hepatosplenomegaly. No bruits or masses. Good bowel sounds. Driveline site clean. Anchor in place.  Extremities: no cyanosis, clubbing, rash. Warm no edema  Neuro: alert & oriented x 3. No focal deficits. Moves all 4 without problem    Telemetry   AV pacing 60-70 Personally reviewed   Labs   Basic Metabolic Panel: Recent Labs  Lab 10/08/21 0316 10/08/21 1658 10/09/21 0504 10/09/21 1552 10/10/21 0437 10/10/21 1714 10/11/21 0421 10/11/21 1200 10/12/21 0447  NA 133*   < > 137   < > 135 134* 133* 133* 132*  K 4.8   < > 5.3*   < > 5.1 5.3* 6.1* 5.3* 5.5*  CL 100   < > 100   < > 99  102 100 98 97*  CO2 26   < > 27   < > 27 26 27 24 24   GLUCOSE 160*   < > 109*   < > 132* 114* 127* 130* 121*  BUN 28*   < > 30*   < > 25* 25* 33* 40* 49*  CREATININE 1.39*   < > 1.44*   < > 1.39* 1.78* 2.58* 3.03* 3.91*  CALCIUM 8.0*   < > 8.6*   < > 7.2* 8.1* 8.5* 8.8* 8.6*  MG 2.3  --  2.4  --  2.5*  --  2.5*  --  2.6*  PHOS 1.5*   < > 1.7*   < > 2.5 2.6 4.1 4.3 5.5*   < > = values in this interval not displayed.     Liver Function Tests: Recent Labs  Lab 10/10/21 0437 10/10/21 1714 10/11/21 0421 10/11/21 1200 10/12/21 0447  ALBUMIN 1.9* 1.9* 2.0* 2.0* 1.9*    No results for input(s): LIPASE, AMYLASE in the last 168 hours. No results for  input(s): AMMONIA in the last 168 hours.   CBC: Recent Labs  Lab 10/08/21 0316 10/09/21 0504 10/10/21 0437 10/11/21 0421 10/12/21 0447  WBC 12.4* 12.8* 11.6* 13.4* 15.9*  HGB 8.3* 8.9* 8.1* 8.5* 7.1*  HCT 26.9* 28.2* 25.8* 27.8* 22.7*  MCV 94.7 92.8 96.3 97.9 97.0  PLT 358 371 372 509* 576*     INR: Recent Labs  Lab 10/08/21 0542 10/09/21 0504 10/10/21 0437 10/11/21 0421 10/12/21 0447  INR 1.5* 2.1* 2.1* 2.0* 2.4*     Other results:   Imaging   DG CHEST PORT 1 VIEW  Result Date: 10/11/2021 CLINICAL DATA:  Shortness of breath and syncopal episode. EXAM: PORTABLE CHEST 1 VIEW COMPARISON:  10/08/2021 FINDINGS: 1352 hours. The cardio pericardial silhouette is enlarged. Left ventricular assist device again noted. Right IJ central line tip overlies the distal SVC region. Right PICC line tip overlies the mid SVC level. Left permanent pacemaker noted. Tiny right pleural effusion noted. Left base collapse/consolidative opacity is similar to prior. IMPRESSION: No substantial change. Left base collapse/consolidative opacity with tiny right pleural effusion. Electronically Signed   By: Misty Stanley M.D.   On: 10/11/2021 14:07     Medications:     Scheduled Medications:  sodium chloride   Intravenous Once   sodium chloride   Intravenous Once   sodium chloride   Intravenous Once   sodium chloride   Intravenous Once   amiodarone  200 mg Oral BID   aspirin  81 mg Oral Daily   Chlorhexidine Gluconate Cloth  6 each Topical Daily   darbepoetin (ARANESP) injection - NON-DIALYSIS  60 mcg Subcutaneous Q Mon-1800   docusate sodium  100 mg Oral BID   feeding supplement  237 mL Oral TID BM   feeding supplement (KATE FARMS STANDARD 1.4)  325 mL Oral TID BM   mouth rinse  15 mL Mouth Rinse BID   midodrine  20 mg Oral TID WC   mirtazapine  7.5 mg Oral QHS   montelukast  5 mg Oral QHS   multivitamin  1 tablet Oral QHS   pantoprazole  40 mg Oral Daily   rosuvastatin  10 mg Oral Daily    senna  2 tablet Oral QHS   sodium chloride flush  10-40 mL Intracatheter Q12H   sodium chloride flush  3 mL Intravenous Q12H   sodium zirconium cyclosilicate  10 g Oral BID   Warfarin - Pharmacist Dosing  Inpatient   Does not apply q1600    Infusions:   prismasol BGK 4/2.5 400 mL/hr at 10/10/21 0125    prismasol BGK 4/2.5 200 mL/hr at 10/10/21 0132   sodium chloride Stopped (09/20/21 0304)   sodium chloride Stopped (09/18/21 0803)   sodium chloride Stopped (10/02/21 0900)   milrinone 0.25 mcg/kg/min (10/12/21 0800)   norepinephrine (LEVOPHED) Adult infusion Stopped (10/09/21 1206)   prismasol BGK 4/2.5 1,800 mL/hr at 10/10/21 1355   promethazine (PHENERGAN) injection (IM or IVPB) Stopped (10/10/21 1755)    PRN Medications: sodium chloride, sodium chloride, acetaminophen, albuterol, Gerhardt's butt cream, heparin, heparin, lip balm, ondansetron (ZOFRAN) IV, oxyCODONE, polyethylene glycol, promethazine (PHENERGAN) injection (IM or IVPB), sodium chloride flush, sodium chloride flush, traMADol   Assessment/Plan:    1. Acute on chronic systolic CHF:  Long-standing cardiomyopathy.  [place].  Echo this admission with EF < 20%, severe LV dilation, restrictive diastolic function, moderate RV dysfunction, moderate MR, mod-severe TR. Cause of cardiomyopathy is uncertain.  He has a history of CAD, but I do not think that the described CAD from the past could explain his cardiomyopathy, but CAD could have progressed.  With difficulty tolerating GDMT/need for midodrine and cardiorenal syndrome as well as profound volume overload + NYHA class IV symptoms,  concerned for low output HF. Co-ox off milrinone was 36%, milrinone started and increased to 0.375 but CO remained low. NE added and Impella 5.5 placed 12/23. EF 10% on TEE 12/23. s/p HM III VAD on 12/28.  Ramp echo 01/03 with EF 20-25%, RV moderately reduced, septum mid-line.  Ramp echo again on 1/12 showed severely dilated/moderately  dysfunctional RV with severe TR and left-shifted septum with small LV.  Speed decreased on 1/12 to 5000 rpm over 2 sessions due to RV failure and milrinone 0.25 + NE 3 restarted.  Echo repeated on 1/16, RV looks better and LV larger but still with left shift of the septum.  Able to increase CVVH, weight down significantly.  Echo again on 1/18, speed decreased to 4900 with RV mildly dysfunctional and severely enlarged with severe TR.  CVVHD stopped 1/20  - MAP 80s today, CVP 11 with co-ox 69%. On milrinone 0.25, NE now off. Will decrease milrinone to 0.125 - Now off CRRT. No urine output. Volume stable elevated.  Plan for HD tomorrow  - Continue midodrine 20 mg tid.   - He does not have BP room for sildenafil.  - Warfarin for INR 2-2.5.  INR 2.4 today Discussed dosing with PharmD personally. - Continue 81 mg aspirin - Continue UNNA boots 2. Tricuspid regurgitation:  Tricuspid repair not done at time of VAD d/t proximity of ICD wires and hypotension during surgery.  He has severe TR.  3. Atrial fibrillation: Now in NSR - Continue PO amio 200 bid  4. AKI on CKD stage 3: Gradual rise (was about 1.7 pre-op) since surgery.  He has not been hypotensive post-op, suspect intra-op hypotension led to development of ATN => urine sediment looked like ATN per renal. Now on CVVH. TDC placed 1/7.  - Maintain MAP and CO as above - Now off CRRT since 1/20 volume stable. Plan iHD Monday  - Appreciate Nephrology input 5. CAD: History of PCI to OM1 in 2007 and RCA in 2013.  No CP or ACs.  - Deferred cardiac cath due to AKI and plan for VAD - No s/s angina  - Continue Crestor.  6. ID: Had fever 12/21, PCT 1.03.  Blood cultures negative. ?  Phlebitis at IV site. Post-op fever with elevated WBCs, PCT 2.63 -> 1.78.  He completed course of cefepime. Afebrile with stable WBCs.   7. Constipation: Resolved.  8. Hyponatremia: Hypervolemic hyponatremia.  Controlled via CVVH.  9. Anemia: 1 unit PRBCs on 1/12,  1 unit PRBs 1/13,  1 unit 1/16. No overt bleeding, ?losses via CVVH.  Hgb 8.5 today -> 7.1 today but no evidence bleeding Hgb 8.3 on co-ox. Will recheck - Receiving IV iron x 5 doses per nephrology (finished 1/20) 10. Confusion/delirium: Initially post-op and started on Precedex.  Resolved.  11. F/E/N: TFs via Cortrack for poor nutritional status.  Improving po intake.  - TF off. Ate well yesterday. Continue to monitor. - Prealbumin 7.8>9.5.  - Added remeron to help with appetite.  12. Left pleural effusion: - s/p thoracentesis 1/7 - s/p thoracentesis 1/12. 13. Pericardial effusion: Stable moderate pericardial effusion, primarily localized to along the lateral wall of the LV.  Does not appear to be affecting the RV/doubt tamponade.  Discussed after 1/12 and 1/18 echo with Dr. Cyndia Bent, conservative management for now.  14. Right great toe pain: - ? Possible gout.  - Uric acid 13.3 -> 1.6 (?) 15. Hyperkalemia - Management per Renal. K 5.5 today - Receiving Lokelma - For iHD tomorrow   Will eventually need acute inpatient rehab -> once he tolerates iHD  Glori Bickers, MD  9:07 AM

## 2021-10-12 NOTE — Progress Notes (Signed)
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 Acute component either from cardiorenal +/- ATN with decreased renal function from hemodynamics and CPB + hypotensive episodes + pressors.  - CRRT started on 1/3 - stopped mid day 1/20. Plan is to hold over weekend and attempt iHD Monday. K is doing ok with lokelma and volume is ok.   -Cont monitoring daily I/Os + weights  -Maintain MAP>65 for optimal renal perfusion.  -Avoid nephrotoxic medications including NSAIDs, contrast. - Dose medications for a GFR <15 ml/min and also on CRRT.   Cardiogenic shock: s/p LVAD 09/17/22 (HM 3).   - on milrinone and midodrine now, off NE now  CAD with h/o PCI last in 2013  Anemia- iron indices 1/16 showing iron sat 12%, ferritin 477, Aranesp 10/06/21. Giving IV iron x 1g over 5 doses.   Afib- on amio  Acute hypoxic RF: s/p thoracentesis 1.3 L 10/02/21 Hyperkalemia:  K 5.5 this AM, repeat lokelma x 2 doses today. He's vegetarian/pescatarian and has really struggled to maintain nutrition during admission - for now won't formally restrict diet but may need to revisit  Dispo: ICU, ultimately will need to be able to tolerate iHD, moving in that direction now.  Should he require milrinone + HD this would be a major concern for potential disposition.  Subjective:    No major new issues.   I/os yesterday 470 / 0 K has improved with lokelma yesterday.     Objective:   BP 125/85    Pulse (!) 107    Temp 98.1 F (36.7 C) (Oral)    Resp 19    Ht 5\' 9"  (1.753 m)    Wt 73.4 kg    SpO2 93%    BMI 23.90 kg/m   Intake/Output Summary (Last 24 hours) at 10/12/2021 9381 Last data filed at 10/12/2021 0800 Gross per 24 hour  Intake 336.46 ml  Output --  Net 336.46 ml    Weight change: 0.7 kg  Physical Exam: GEN: NAD, sleeping HEENT: wearing glasses LUNGS: normal WOB, clear ant CV: LVAD  EXT: minimal edema, much improved,  legs wrapped at tibial level ACCESS: rt IJ TC c/d/i   Imaging: DG CHEST PORT 1 VIEW  Result Date: 10/11/2021 CLINICAL DATA:  Shortness of breath and syncopal episode. EXAM: PORTABLE CHEST 1 VIEW COMPARISON:  10/08/2021 FINDINGS: 1352 hours. The cardio pericardial silhouette is enlarged. Left ventricular assist device again noted. Right IJ central line tip overlies the distal SVC region. Right PICC line tip overlies the mid SVC level. Left permanent pacemaker noted. Tiny right pleural effusion noted. Left base collapse/consolidative opacity is similar to prior. IMPRESSION: No substantial change. Left base collapse/consolidative opacity with tiny right pleural effusion. Electronically Signed   By: Misty Stanley M.D.   On: 10/11/2021 14:07    Labs: BMET Recent Labs  Lab 10/09/21 0504 10/09/21 1552 10/10/21 0437 10/10/21 1714 10/11/21 0421 10/11/21 1200 10/12/21 0447  NA 137 137 135 134* 133* 133* 132*  K 5.3* 4.7 5.1 5.3* 6.1* 5.3* 5.5*  CL 100 101 99 102 100 98 97*  CO2 27 26 27 26 27 24 24   GLUCOSE 109* 106* 132* 114* 127* 130* 121*  BUN 30* 33* 25* 25* 33* 40* 49*  CREATININE 1.44* 1.53* 1.39* 1.78* 2.58* 3.03* 3.91*  CALCIUM 8.6* 7.5* 7.2* 8.1* 8.5* 8.8* 8.6*  PHOS 1.7* 4.1 2.5 2.6 4.1 4.3 5.5*  CBC Recent Labs  Lab 10/09/21 0504 10/10/21 0437 10/11/21 0421 10/12/21 0447  WBC 12.8* 11.6* 13.4* 15.9*  HGB 8.9* 8.1* 8.5* 7.1*  HCT 28.2* 25.8* 27.8* 22.7*  MCV 92.8 96.3 97.9 97.0  PLT 371 372 509* 576*     Medications:     sodium chloride   Intravenous Once   sodium chloride   Intravenous Once   sodium chloride   Intravenous Once   sodium chloride   Intravenous Once   amiodarone  200 mg Oral BID   aspirin  81 mg Oral Daily   Chlorhexidine Gluconate Cloth  6 each Topical Daily   darbepoetin (ARANESP) injection - NON-DIALYSIS  60 mcg Subcutaneous Q Mon-1800   docusate sodium  100 mg Oral BID   feeding supplement  237 mL Oral TID BM   feeding supplement (KATE  FARMS STANDARD 1.4)  325 mL Oral TID BM   mouth rinse  15 mL Mouth Rinse BID   midodrine  20 mg Oral TID WC   mirtazapine  7.5 mg Oral QHS   montelukast  5 mg Oral QHS   multivitamin  1 tablet Oral QHS   pantoprazole  40 mg Oral Daily   rosuvastatin  10 mg Oral Daily   senna  2 tablet Oral QHS   sodium chloride flush  10-40 mL Intracatheter Q12H   sodium chloride flush  3 mL Intravenous Q12H   sodium zirconium cyclosilicate  10 g Oral BID   warfarin  0.5 mg Oral ONCE-1600   Warfarin - Pharmacist Dosing Inpatient   Does not apply q1600   Jannifer Hick MD [facility] Pager [phone]

## 2021-10-13 DIAGNOSIS — Z95811 Presence of heart assist device: Secondary | ICD-10-CM | POA: Diagnosis not present

## 2021-10-13 DIAGNOSIS — R570 Cardiogenic shock: Secondary | ICD-10-CM | POA: Diagnosis not present

## 2021-10-13 LAB — RENAL FUNCTION PANEL
Albumin: 1.9 g/dL — ABNORMAL LOW (ref 3.5–5.0)
Anion gap: 11 (ref 5–15)
BUN: 60 mg/dL — ABNORMAL HIGH (ref 8–23)
CO2: 23 mmol/L (ref 22–32)
Calcium: 8.5 mg/dL — ABNORMAL LOW (ref 8.9–10.3)
Chloride: 98 mmol/L (ref 98–111)
Creatinine, Ser: 4.97 mg/dL — ABNORMAL HIGH (ref 0.61–1.24)
GFR, Estimated: 12 mL/min — ABNORMAL LOW (ref >60)
Glucose, Bld: 109 mg/dL — ABNORMAL HIGH (ref 70–99)
Phosphorus: 6.9 mg/dL — ABNORMAL HIGH (ref 2.5–4.6)
Potassium: 5.6 mmol/L — ABNORMAL HIGH (ref 3.5–5.1)
Sodium: 132 mmol/L — ABNORMAL LOW (ref 135–145)

## 2021-10-13 LAB — MAGNESIUM: Magnesium: 2.5 mg/dL — ABNORMAL HIGH (ref 1.7–2.4)

## 2021-10-13 LAB — CBC
HCT: 24.7 % — ABNORMAL LOW (ref 39.0–52.0)
Hemoglobin: 7.8 g/dL — ABNORMAL LOW (ref 13.0–17.0)
MCH: 30.1 pg (ref 26.0–34.0)
MCHC: 31.6 g/dL (ref 30.0–36.0)
MCV: 95.4 fL (ref 80.0–100.0)
Platelets: 599 10*3/uL — ABNORMAL HIGH (ref 150–400)
RBC: 2.59 MIL/uL — ABNORMAL LOW (ref 4.22–5.81)
RDW: 20.2 % — ABNORMAL HIGH (ref 11.5–15.5)
WBC: 13.8 10*3/uL — ABNORMAL HIGH (ref 4.0–10.5)
nRBC: 0 % (ref 0.0–0.2)

## 2021-10-13 LAB — PROTIME-INR
INR: 2.5 — ABNORMAL HIGH (ref 0.8–1.2)
Prothrombin Time: 26.7 seconds — ABNORMAL HIGH (ref 11.4–15.2)

## 2021-10-13 LAB — GLUCOSE, CAPILLARY
Glucose-Capillary: 96 mg/dL (ref 70–99)
Glucose-Capillary: 75 mg/dL (ref 70–99)
Glucose-Capillary: 104 mg/dL — ABNORMAL HIGH (ref 70–99)
Glucose-Capillary: 95 mg/dL (ref 70–99)
Glucose-Capillary: 129 mg/dL — ABNORMAL HIGH (ref 70–99)

## 2021-10-13 LAB — COOXEMETRY PANEL
Carboxyhemoglobin: 1.9 % — ABNORMAL HIGH (ref 0.5–1.5)
Methemoglobin: 0.9 % (ref 0.0–1.5)
O2 Saturation: 65.5 %
Total hemoglobin: 8 g/dL — ABNORMAL LOW (ref 12.0–16.0)

## 2021-10-13 LAB — LACTATE DEHYDROGENASE: LDH: 226 U/L — ABNORMAL HIGH (ref 98–192)

## 2021-10-13 MED ORDER — DARBEPOETIN ALFA 150 MCG/0.3ML IJ SOSY
150.0000 ug | PREFILLED_SYRINGE | INTRAMUSCULAR | Status: DC
  Administered 2021-10-20: 150 ug via SUBCUTANEOUS
  Filled 2021-10-13 (×3): qty 0.3

## 2021-10-13 MED ORDER — PRISMASOL BGK 4/2.5 32-4-2.5 MEQ/L EC SOLN: Status: DC

## 2021-10-13 MED ORDER — SODIUM CHLORIDE 0.9 % FOR CRRT: INTRAVENOUS_CENTRAL | Status: DC | PRN

## 2021-10-13 MED ORDER — HEPARIN SODIUM (PORCINE) 1000 UNIT/ML DIALYSIS
1000.0000 [IU] | INTRAMUSCULAR | Status: DC | PRN
  Administered 2021-10-15: 06:00:00 4000 [IU] via INTRAVENOUS_CENTRAL
  Filled 2021-10-13 (×2): qty 6
  Filled 2021-10-13: qty 1

## 2021-10-13 MED ORDER — WARFARIN SODIUM 1 MG PO TABS
1.0000 mg | ORAL_TABLET | Freq: Once | ORAL | Status: AC
Start: 2021-10-13 — End: 2021-10-13
  Administered 2021-10-13: 1 mg via ORAL
  Filled 2021-10-13: qty 1

## 2021-10-13 MED ORDER — PRISMASOL BGK 4/2.5 32-4-2.5 MEQ/L REPLACEMENT SOLN: Status: DC

## 2021-10-13 NOTE — Progress Notes (Signed)
I had been called at least 2 times by the care team taking care of Mr. Isaac [name] requesting that dialysis be done sooner rather than later due to volume-  unfortunately I could not guarantee that patient would get HD in the next 4 hours so I think the best option is to go ahead and put the patient back on CRRT-  I am sorry that IHD was not able to meet your /my needs today   Louis Meckel

## 2021-10-13 NOTE — Progress Notes (Addendum)
Isaac [name] Progress Note     Assessment/ Plan:   Acute kidney injury on CKD3b: with a baseline cr in the 1.6-2 range. Acute component either from cardiorenal +/- ATN with decreased renal function from hemodynamics and CPB + hypotensive episodes + pressors.  - CRRT started on 1/3 - stopped mid day 1/20. Has been anuric and BUN-CRT rising-  planning for IHD today -  has tunneled HD cath in place   Cardiogenic shock: s/p LVAD 09/17/22 (HM 3).   - on milrinone and midodrine now, off NE now  CAD with h/o PCI last in 2013  Anemia- iron indices 1/16 showing iron sat 12%, ferritin 477, Aranesp  60 on 10/06/21. Gave IV iron x 1g over 5 doses. Hgb falling-  will inc darbe  Afib- on amio  Acute hypoxic RF: s/p thoracentesis 1.3 L 10/02/21  Hyperkalemia:  K consistently over 5, on lokelma BID. He's vegetarian/pescatarian and has really struggled to maintain nutrition during admission - for now won't formally restrict diet but may need to revisit  Dispo: ICU, ultimately will need to be able to tolerate iHD, moving in that direction now.  Should he require milrinone + HD this would be a major concern for potential disposition.  Subjective:    No major new issues.   I/os yesterday 556 / 0-  says he has made a little bit of urine  K stable with lokelma -  planning for first IHD today     Objective:   BP 112/77    Pulse 67    Temp 98.2 F (36.8 C) (Oral)    Resp (!) 22    Ht 5\' 9"  (1.753 m)    Wt 74.3 kg    SpO2 98%    BMI 24.19 kg/m   Intake/Output Summary (Last 24 hours) at 10/13/2021 0809 Last data filed at 10/13/2021 0700 Gross per 24 hour  Intake 550.65 ml  Output --  Net 550.65 ml   Weight change: 0.9 kg  Physical Exam: GEN: NAD, sleeping HEENT: wearing glasses LUNGS: normal WOB, clear ant CV: LVAD  EXT: minimal edema, much improved, legs wrapped at tibial level ACCESS: rt IJ TC c/d/I-     Imaging: DG CHEST PORT 1 VIEW  Result Date: 10/11/2021 CLINICAL  DATA:  Shortness of breath and syncopal episode. EXAM: PORTABLE CHEST 1 VIEW COMPARISON:  10/08/2021 FINDINGS: 1352 hours. The cardio pericardial silhouette is enlarged. Left ventricular assist device again noted. Right IJ central line tip overlies the distal SVC region. Right PICC line tip overlies the mid SVC level. Left permanent pacemaker noted. Tiny right pleural effusion noted. Left base collapse/consolidative opacity is similar to prior. IMPRESSION: No substantial change. Left base collapse/consolidative opacity with tiny right pleural effusion. Electronically Signed   By: Misty Stanley M.D.   On: 10/11/2021 14:07    Labs: BMET Recent Labs  Lab 10/09/21 1552 10/10/21 0437 10/10/21 1714 10/11/21 0421 10/11/21 1200 10/12/21 0447 10/13/21 0353  NA 137 135 134* 133* 133* 132* 132*  K 4.7 5.1 5.3* 6.1* 5.3* 5.5* 5.6*  CL 101 99 102 100 98 97* 98  CO2 26 27 26 27 24 24 23   GLUCOSE 106* 132* 114* 127* 130* 121* 109*  BUN 33* 25* 25* 33* 40* 49* 60*  CREATININE 1.53* 1.39* 1.78* 2.58* 3.03* 3.91* 4.97*  CALCIUM 7.5* 7.2* 8.1* 8.5* 8.8* 8.6* 8.5*  PHOS 4.1 2.5 2.6 4.1 4.3 5.5* 6.9*   CBC Recent Labs  Lab 10/11/21 0421 10/12/21 0447 10/12/21  7253 10/13/21 0353  WBC 13.4* 15.9* 15.0* 13.8*  HGB 8.5* 7.1* 8.2* 7.8*  HCT 27.8* 22.7* 26.5* 24.7*  MCV 97.9 97.0 95.7 95.4  PLT 509* 576* 539* 599*    Medications:     sodium chloride   Intravenous Once   sodium chloride   Intravenous Once   sodium chloride   Intravenous Once   sodium chloride   Intravenous Once   amiodarone  200 mg Oral BID   aspirin  81 mg Oral Daily   Chlorhexidine Gluconate Cloth  6 each Topical Daily   Chlorhexidine Gluconate Cloth  6 each Topical Q0600   darbepoetin (ARANESP) injection - NON-DIALYSIS  60 mcg Subcutaneous Q Mon-1800   docusate sodium  100 mg Oral BID   feeding supplement  237 mL Oral TID BM   mouth rinse  15 mL Mouth Rinse BID   midodrine  20 mg Oral TID WC   mirtazapine  7.5 mg Oral QHS    montelukast  5 mg Oral QHS   multivitamin  1 tablet Oral QHS   pantoprazole  40 mg Oral Daily   rosuvastatin  10 mg Oral Daily   senna  2 tablet Oral QHS   sodium chloride flush  10-40 mL Intracatheter Q12H   sodium chloride flush  3 mL Intravenous Q12H   sodium zirconium cyclosilicate  10 g Oral BID   warfarin  1 mg Oral ONCE-1600   Warfarin - Pharmacist Dosing Inpatient   Does not apply [facility] Pager [phone]

## 2021-10-13 NOTE — Progress Notes (Addendum)
Patient ID: Isaac [name], male   DOB: 07-14-1951, 71 y.o.   MRN: [id number]    Advanced Heart Failure VAD Team Note  PCP-Cardiologist: None   Subjective:    12/19 RHC- RA 7, PA 39/14 (25), PCWP 11, CO 6.4 CI 3. Thermo 3.5 1.8. Lasix drip stopped after cath. 12/20 Swan removed.  Norepi 3 mcg added.  12/21 Fever. Blood CX drawn. UA + leukocytes. Started on vanc and cefepime. Diuresed with IV lasix + metolazone.  12/23 Impella 5.5 placed 12/28 HM III LVAD, ICD leads plastered to tricuspid valve with severe TR, valve not replaced and some improvement in TR with LVAD placement.  12/29 Extubated 01/03 Started CRRT 01/07  L thoracentesis and placement of [facility] 01/09 LVAD speed increased to 5600 rpm 01/10 Milrinone stopped. Amio gtt>>PO. Completed 5 day course Cefepime.  01/12 Ramp echo done: Moderate lateral pericardial effusion w/o tamponade, RV larger with severe dilation/moderate dysfunction and left shift of the septum, LV small and undershifted.  TR severe.  LVAD speed decreased to 5000 rpms at 2 different sessions.  Also had thoracentesis on left with 1.3 L off.  01/13 TFs begun 01/16 echo reviewed: EF 25-30%, LV larger than prior but septum still with left shift, RV moderately dilated and moderately dysfunctional, moderate-severe TR, mod-large localized pericardial effusion lateral to the LV.  1/18 Ramp Echo with speed decreased to 4900  1/21 CVVHD stopped  Yesterday, milrinone reduced to 0.125. Co-ox 66%.   Scr 4.97, K 5.6, Na 132   3 unmeasured urinary occurences charted yesterday, but reports very minimal output.   Wt up ~8 lb in the last 3 days. CVP 17. Denies resting dyspnea.   Hgb 7.8. INR 2.5   Appetite is improving. Overall, feels better.     LVAD INTERROGATION:  HeartMate III LVAD:   Flow 3.5  liters/min, speed 4900, power 3.2 PI  5.3  No PI events. VAD interrogated personally. Parameters stable.     Objective:    Vital Signs:   Temp:  [97.7 F (36.5 C)-98.3  F (36.8 C)] 97.7 F (36.5 C) (01/23 0337) Pulse Rate:  [59-172] 67 (01/23 0700) Resp:  [12-60] 22 (01/23 0700) BP: (97-128)/(70-98) 112/77 (01/23 0700) SpO2:  [89 %-98 %] 98 % (01/23 0700) Weight:  [74.3 kg] 74.3 kg (01/23 0500) Last BM Date: 10/12/21 Mean arterial Pressure 80s   Intake/Output:   Intake/Output Summary (Last 24 hours) at 10/13/2021 0727 Last data filed at 10/13/2021 0700 Gross per 24 hour  Intake 556.68 ml  Output --  Net 556.68 ml     Physical Exam   CVP 17  General:  well appearing, sitting up in chair. No distress HEENT: normal  Neck: supple. JVP to jaw.  Carotids 2+ bilat; no bruits. No lymphadenopathy or thryomegaly appreciated. Cor: LVAD hum.  Lungs: CTAB. Abdomen: obese soft, nontender, non-distended. No hepatosplenomegaly. No bruits or masses. Good bowel sounds. Driveline site clean. Anchor in place.  Extremities: no cyanosis, clubbing, rash. Warm no edema + unna boots  Neuro: alert & oriented x 3. No focal deficits. Moves all 4 without problem    Telemetry   AV pacing 70s Personally reviewed   Labs   Basic Metabolic Panel: Recent Labs  Lab 10/09/21 0504 10/09/21 1552 10/10/21 0437 10/10/21 1714 10/11/21 0421 10/11/21 1200 10/12/21 0447 10/13/21 0353  NA 137   < > 135 134* 133* 133* 132* 132*  K 5.3*   < > 5.1 5.3* 6.1* 5.3* 5.5* 5.6*  CL 100   < >  99 102 100 98 97* 98  CO2 27   < > 27 26 27 24 24 23   GLUCOSE 109*   < > 132* 114* 127* 130* 121* 109*  BUN 30*   < > 25* 25* 33* 40* 49* 60*  CREATININE 1.44*   < > 1.39* 1.78* 2.58* 3.03* 3.91* 4.97*  CALCIUM 8.6*   < > 7.2* 8.1* 8.5* 8.8* 8.6* 8.5*  MG 2.4  --  2.5*  --  2.5*  --  2.6* 2.5*  PHOS 1.7*   < > 2.5 2.6 4.1 4.3 5.5* 6.9*   < > = values in this interval not displayed.    Liver Function Tests: Recent Labs  Lab 10/10/21 1714 10/11/21 0421 10/11/21 1200 10/12/21 0447 10/13/21 0353  ALBUMIN 1.9* 2.0* 2.0* 1.9* 1.9*   No results for input(s): LIPASE, AMYLASE in the  last 168 hours. No results for input(s): AMMONIA in the last 168 hours.   CBC: Recent Labs  Lab 10/10/21 0437 10/11/21 0421 10/12/21 0447 10/12/21 0916 10/13/21 0353  WBC 11.6* 13.4* 15.9* 15.0* 13.8*  HGB 8.1* 8.5* 7.1* 8.2* 7.8*  HCT 25.8* 27.8* 22.7* 26.5* 24.7*  MCV 96.3 97.9 97.0 95.7 95.4  PLT 372 509* 576* 539* 599*    INR: Recent Labs  Lab 10/09/21 0504 10/10/21 0437 10/11/21 0421 10/12/21 0447 10/13/21 0353  INR 2.1* 2.1* 2.0* 2.4* 2.5*    Other results:   Imaging   DG CHEST PORT 1 VIEW  Result Date: 10/11/2021 CLINICAL DATA:  Shortness of breath and syncopal episode. EXAM: PORTABLE CHEST 1 VIEW COMPARISON:  10/08/2021 FINDINGS: 1352 hours. The cardio pericardial silhouette is enlarged. Left ventricular assist device again noted. Right IJ central line tip overlies the distal SVC region. Right PICC line tip overlies the mid SVC level. Left permanent pacemaker noted. Tiny right pleural effusion noted. Left base collapse/consolidative opacity is similar to prior. IMPRESSION: No substantial change. Left base collapse/consolidative opacity with tiny right pleural effusion. Electronically Signed   By: Misty Stanley M.D.   On: 10/11/2021 14:07     Medications:     Scheduled Medications:  sodium chloride   Intravenous Once   sodium chloride   Intravenous Once   sodium chloride   Intravenous Once   sodium chloride   Intravenous Once   amiodarone  200 mg Oral BID   aspirin  81 mg Oral Daily   Chlorhexidine Gluconate Cloth  6 each Topical Daily   Chlorhexidine Gluconate Cloth  6 each Topical Q0600   darbepoetin (ARANESP) injection - NON-DIALYSIS  60 mcg Subcutaneous Q Mon-1800   docusate sodium  100 mg Oral BID   feeding supplement  237 mL Oral TID BM   feeding supplement (KATE FARMS STANDARD 1.4)  325 mL Oral TID BM   mouth rinse  15 mL Mouth Rinse BID   midodrine  20 mg Oral TID WC   mirtazapine  7.5 mg Oral QHS   montelukast  5 mg Oral QHS   multivitamin   1 tablet Oral QHS   pantoprazole  40 mg Oral Daily   rosuvastatin  10 mg Oral Daily   senna  2 tablet Oral QHS   sodium chloride flush  10-40 mL Intracatheter Q12H   sodium chloride flush  3 mL Intravenous Q12H   sodium zirconium cyclosilicate  10 g Oral BID   Warfarin - Pharmacist Dosing Inpatient   Does not apply q1600    Infusions:   prismasol BGK 4/2.5 400 mL/hr at  10/10/21 0125    prismasol BGK 4/2.5 200 mL/hr at 10/10/21 0132   sodium chloride Stopped (09/20/21 0304)   sodium chloride Stopped (09/18/21 0803)   sodium chloride Stopped (10/02/21 0900)   milrinone 0.125 mcg/kg/min (10/13/21 0700)   norepinephrine (LEVOPHED) Adult infusion Stopped (10/09/21 1206)   prismasol BGK 4/2.5 1,800 mL/hr at 10/10/21 1355   promethazine (PHENERGAN) injection (IM or IVPB) Stopped (10/10/21 1755)    PRN Medications: sodium chloride, sodium chloride, acetaminophen, albuterol, Gerhardt's butt cream, heparin, heparin, lip balm, ondansetron (ZOFRAN) IV, oxyCODONE, polyethylene glycol, promethazine (PHENERGAN) injection (IM or IVPB), sodium chloride flush, sodium chloride flush, traMADol   Assessment/Plan:    1. Acute on chronic systolic CHF:  Long-standing cardiomyopathy.  [place].  Echo this admission with EF < 20%, severe LV dilation, restrictive diastolic function, moderate RV dysfunction, moderate MR, mod-severe TR. Cause of cardiomyopathy is uncertain.  He has a history of CAD, but I do not think that the described CAD from the past could explain his cardiomyopathy, but CAD could have progressed.  With difficulty tolerating GDMT/need for midodrine and cardiorenal syndrome as well as profound volume overload + NYHA class IV symptoms,  concerned for low output HF. Co-ox off milrinone was 36%, milrinone started and increased to 0.375 but CO remained low. NE added and Impella 5.5 placed 12/23. EF 10% on TEE 12/23. s/p HM III VAD on 12/28.  Ramp echo 01/03 with EF 20-25%, RV moderately  reduced, septum mid-line.  Ramp echo again on 1/12 showed severely dilated/moderately dysfunctional RV with severe TR and left-shifted septum with small LV.  Speed decreased on 1/12 to 5000 rpm over 2 sessions due to RV failure and milrinone 0.25 + NE 3 restarted.  Echo repeated on 1/16, RV looks better and LV larger but still with left shift of the septum.  Able to increase CVVH, weight down significantly.  Echo again on 1/18, speed decreased to 4900 with RV mildly dysfunctional and severely enlarged with severe TR.  CVVHD stopped 1/20  - MAP 80s today, CVP 17 with co-ox 66%. On milrinone 0.125, NE now off. Stop milrinone today - Now off CRRT. No urine output. Volume stable elevated.  Plan for HD today - Continue midodrine 20 mg tid.   - He does not have BP room for sildenafil.  - Warfarin for INR 2-2.5.  INR 2.5 today Discussed dosing with PharmD personally. - Continue 81 mg aspirin - Continue UNNA boots 2. Tricuspid regurgitation:  Tricuspid repair not done at time of VAD d/t proximity of ICD wires and hypotension during surgery.  He has severe TR.  3. Atrial fibrillation: Now in NSR - Continue PO amio 200 bid  4. AKI on CKD stage 3: Gradual rise (was about 1.7 pre-op) since surgery.  He has not been hypotensive post-op, suspect intra-op hypotension led to development of ATN => urine sediment looked like ATN per renal. Got CVVH. TDC placed 1/7.  - Maintain MAP and CO as above - Now off CRRT since 1/20 volume stable. Plan iHD Today - Appreciate Nephrology input 5. CAD: History of PCI to OM1 in 2007 and RCA in 2013.  No CP or ACs.  - Deferred cardiac cath due to AKI and plan for VAD - No s/s angina  - Continue Crestor.  6. ID: Had fever 12/21, PCT 1.03.  Blood cultures negative. ?Phlebitis at IV site. Post-op fever with elevated WBCs, PCT 2.63 -> 1.78.  He completed course of cefepime. Afebrile with stable WBCs.  7. Constipation: Resolved.  8. Hyponatremia: Hypervolemic hyponatremia.   Controlled via CVVH.  9. Anemia: 1 unit PRBCs on 1/12,  1 unit PRBs 1/13, 1 unit 1/16. No overt bleeding, ?losses via CVVH.  Hgb 7.8 today but no evidence bleeding Hgb 8.0 on co-ox.  - Transfuse for Hgb <7.5  - Received IV iron x 5 doses per nephrology (finished 1/20) 10. Confusion/delirium: Initially post-op and started on Precedex.  Resolved.  11. F/E/N: TFs via Cortrack for poor nutritional status.  Improving po intake.  - TF off. Ate well yesterday. Continue to monitor. - Prealbumin 7.8>9.5.  - Added remeron to help with appetite.  12. Left pleural effusion: - s/p thoracentesis 1/7 - s/p thoracentesis 1/12. 13. Pericardial effusion: Stable moderate pericardial effusion, primarily localized to along the lateral wall of the LV.  Does not appear to be affecting the RV/doubt tamponade.  Discussed after 1/12 and 1/18 echo with Dr. Cyndia Bent, conservative management for now.  14. Right great toe pain: - ? Possible gout.  - Uric acid 13.3 -> 1.6 (?) 15. Hyperkalemia - Management per Renal. K 5.6 today - Receiving [place] today  Will eventually need acute inpatient rehab -> once he tolerates iHD  Lyda Jester, PA-C  7:27 AM  Patient seen with PA, agree with the above note.    He is getting stronger, walking daily and eating better.  CVP 17 today, CVVH stopped Friday.  Minimal UOP.  Co-ox 66% on milrinone 0.125.    No complaints today.   General: Well appearing this am. NAD.  HEENT: Normal. Neck: Supple, JVP 14+ cm. Carotids OK.  Cardiac:  Mechanical heart sounds with LVAD hum present.  Lungs:  CTAB, normal effort.  Abdomen:  NT, ND, no HSM. No bruits or masses. +BS  LVAD exit site: Well-healed and incorporated. Dressing dry and intact. No erythema or drainage. Stabilization device present and accurately applied. Driveline dressing changed daily per sterile technique. Extremities:  Warm and dry. No cyanosis, clubbing, rash, or edema.  Neuro:  Alert & oriented x 3.  Cranial nerves grossly intact. Moves all 4 extremities w/o difficulty. Affect pleasant    I will stop milrinone today.  He has built up significant volume over the weekend and needs HD today.  Would like them to do this sooner rather than later. Continue midodrine to maintain MAP, may be able to taper down as MAP has been doing well. Will see how he does with iHD today before dropping the dose.   Continue mobilization and push nutrition.   CRITICAL CARE Performed by: Loralie Champagne  Total critical care time: 35 minutes  Critical care time was exclusive of separately billable procedures and treating other patients.  Critical care was necessary to treat or prevent imminent or life-threatening deterioration.  Critical care was time spent personally by me on the following activities: development of treatment plan with patient and/or surrogate as well as nursing, discussions with consultants, evaluation of patient's response to treatment, examination of patient, obtaining history from patient or surrogate, ordering and performing treatments and interventions, ordering and review of laboratory studies, ordering and review of radiographic studies, pulse oximetry and re-evaluation of patient's condition.  Loralie Champagne 10/13/2021 9:45 AM

## 2021-10-13 NOTE — Progress Notes (Signed)
Occupational Therapy Treatment Patient Details Name: Isaac [name] MRN: [id number] DOB: 04-27-1951 Today's Date: 10/13/2021   History of present illness Pt is a 71 y.o. male admitted 09/07/21 with near-syncope, nausea, poor oral intake, generalized weakness. Workup for advanced heart failure and persistent inotropic with requirement, cardiogenic shock. [place] 12/19. Impella placed on 12/23. LVAD placed 12/28. Pt with post-op delirium and agitation. CRRT initiated 1/3. S/p thoracentesis 1/12. S/p cortrak placement 1/13. CRRT stopped 1/20.  PMH includes advanced CHF (LVEF <20%), AICD/PPM, PAF on Eliquis, CKD 3, HTN, HLD.   OT comments  Pt progressing towards established OT goals. Pt performing toileting with Min A and cues for adherence to sternal precautions. Pt performing hand hygiene at sink with Min guard A. Providing education on compensatory techniques for LB dressing; pt requiring Min A and significant time due to fatigue. While switching to batteries, pt requiring Mod cues for sequencing and problem solving the mechanics of LVAD connectors/lines. Pt performing functional mobility in hallway with Min guard A and RW. Continue to recommend dc to AIR and will continue to follow acutely as admitted.    Recommendations for follow up therapy are one component of a multi-disciplinary discharge planning process, led by the attending physician.  Recommendations may be updated based on patient status, additional functional criteria and insurance authorization.    Follow Up Recommendations  Acute inpatient rehab (3hours/day)    Assistance Recommended at Discharge Frequent or constant Supervision/Assistance  Patient can return home with the following  A little help with walking and/or transfers;A little help with bathing/dressing/bathroom;Direct supervision/assist for medications management;Direct supervision/assist for financial management;Assistance with cooking/housework   Equipment  Recommendations  BSC/3in1    Recommendations for Other Services PT consult    Precautions / Restrictions Precautions Precautions: Fall;Sternal;Other (comment) Precaution Comments: LVAD Restrictions Other Position/Activity Restrictions: Sternal Precautions       Mobility Bed Mobility               General bed mobility comments: In bathroom upon arrival    Transfers Overall transfer level: Needs assistance Equipment used: Rolling walker (2 wheels) Transfers: Sit to/from Stand Sit to Stand: Min assist           General transfer comment: Min A for power up     Balance Overall balance assessment: Needs assistance Sitting-balance support: No upper extremity supported, Feet supported Sitting balance-Leahy Scale: Fair     Standing balance support: No upper extremity supported, During functional activity Standing balance-Leahy Scale: Fair Standing balance comment: Stood x 3 minutes without min guard for safety/lines                           ADL either performed or assessed with clinical judgement   ADL Overall ADL's : Needs assistance/impaired     Grooming: Wash/dry hands;Min guard;Standing Grooming Details (indicate cue type and reason): Min Guard A for safety with hand hygiene; poor activity tolerance             Lower Body Dressing: Minimal assistance;Sit to/from stand Lower Body Dressing Details (indicate cue type and reason): Min A for power up into standing Toilet Transfer: Minimal assistance Toilet Transfer Details (indicate cue type and reason): Min A for power up from toilet Toileting- Clothing Manipulation and Hygiene: Minimal assistance;Sit to/from stand Toileting - Clothing Manipulation Details (indicate cue type and reason): Cues for techniques to adhere to sternal precautions. Pt performing with Min A to make sure everything is clean  Functional mobility during ADLs: Minimal assistance;Rolling walker (2 wheels) General ADL  Comments: Pt switching to batteries, performing functional mobility in hallway, and completing toileting. Requiring increased time for processing    Extremity/Trunk Assessment Upper Extremity Assessment Upper Extremity Assessment: Generalized weakness   Lower Extremity Assessment Lower Extremity Assessment: Defer to PT evaluation        Vision   Vision Assessment?: No apparent visual deficits   Perception     Praxis      Cognition Arousal/Alertness: Awake/alert Behavior During Therapy: Flat affect Overall Cognitive Status: Impaired/Different from baseline Area of Impairment: Memory, Problem solving                     Memory: Decreased short-term memory, Decreased recall of precautions Following Commands: Follows one step commands consistently     Problem Solving: Slow processing, Requires verbal cues, Requires tactile cues General Comments: Requiring increased time and cues throughout        Exercises      Shoulder Instructions       General Comments VSS on RA    Pertinent Vitals/ Pain       Pain Assessment Pain Assessment: Faces Faces Pain Scale: Hurts a little bit Pain Location: generalized Pain Descriptors / Indicators: Grimacing Pain Intervention(s): Monitored during session, Limited activity within patient's tolerance, Repositioned  Home Living                                          Prior Functioning/Environment              Frequency  Min 2X/week        Progress Toward Goals  OT Goals(current goals can now be found in the care plan section)  Progress towards OT goals: Progressing toward goals  Acute Rehab OT Goals OT Goal Formulation: With patient Time For Goal Achievement: 10/21/21 Potential to Achieve Goals: Good ADL Goals Pt Will Perform Lower Body Bathing: with modified independence;sit to/from stand Pt Will Perform Lower Body Dressing: with modified independence;sit to/from stand Pt Will Transfer  to Toilet: with modified independence;ambulating Additional ADL Goal #1: Pt will tolerate at least 8 minutes of OOB functional task without reast break to incrased activity tolerance Additional ADL Goal #2: pt will indep verbalize at least 2 energy conservation strategies to apply at d/c. Additional ADL Goal #3: Pt will manage LVAD equipment independently.  Plan Discharge plan remains appropriate    Co-evaluation                 AM-PAC OT "6 Clicks" Daily Activity     Outcome Measure   Help from another person eating meals?: None Help from another person taking care of personal grooming?: A Little Help from another person toileting, which includes using toliet, bedpan, or urinal?: A Little Help from another person bathing (including washing, rinsing, drying)?: A Lot Help from another person to put on and taking off regular upper body clothing?: A Lot Help from another person to put on and taking off regular lower body clothing?: A Little 6 Click Score: 17    End of Session Equipment Utilized During Treatment: Rolling walker (2 wheels)  OT Visit Diagnosis: Unsteadiness on feet (R26.81);Muscle weakness (generalized) (M62.81);Other symptoms and signs involving cognitive function;Other (comment);Pain   Activity Tolerance Patient tolerated treatment well   Patient Left in chair;with call bell/phone within reach;with nursing/sitter in room  Nurse Communication Mobility status        Time: 2694-8546 OT Time Calculation (min): 26 min  Charges: OT General Charges $OT Visit: 1 Visit OT Treatments $Self Care/Home Management : 23-37 mins  [place], OTR/L Acute Rehab Pager: [phone] Office: [place] 10/13/2021, 1:48 PM

## 2021-10-13 NOTE — Progress Notes (Signed)
[place] for Warfarin  Indication: LVAD/AF  Allergies  Allergen Reactions   Mushroom Extract Complex Nausea And Vomiting   Neosporin [Neomycin-Bacitracin Zn-Polymyx] Hives    Patient Measurements: Height: 5\' 9"  (175.3 cm) Weight: 74.3 kg (163 lb 12.8 oz) IBW/kg (Calculated) : 70.7 Heparin Dosing Weight: 83.7 kg  Vital Signs: Temp: 97.7 F (36.5 C) (01/23 0337) Temp Source: Axillary (01/23 0337) BP: 112/77 (01/23 0700) Pulse Rate: 67 (01/23 0700)  Labs: Recent Labs    10/11/21 0421 10/11/21 1200 10/12/21 0447 10/12/21 0916 10/13/21 0353  HGB 8.5*  --  7.1* 8.2* 7.8*  HCT 27.8*  --  22.7* 26.5* 24.7*  PLT 509*  --  576* 539* 599*  LABPROT 22.5*  --  26.0*  --  26.7*  INR 2.0*  --  2.4*  --  2.5*  CREATININE 2.58* 3.03* 3.91*  --  4.97*     Estimated Creatinine Clearance: 14 mL/min (A) (by C-G formula based on SCr of 4.97 mg/dL (H)).   Medical History: Past Medical History:  Diagnosis Date   Arrhythmia    atrial fibrillation   CHF (congestive heart failure) (HCC)    Chronic kidney disease    Coronary artery disease    Hyperlipidemia    Hypertension    Myocardial infarct [facility])     Assessment: 71 yo male on chronic Eliquis PTA for afib.  Now s/p LVAD placement 09/17/21, pharmacy asked to begin anticoagulation with warfarin.   Patient previously therapeutic on lower doses 1-2mg  daily prior to tube feed being started for poor nutritional intake.   INR today is therapeutic at 2.5, CBC and LDH stable. Pt endorses increasing appetite.   Goal of Therapy:  INR 2-2.5 Monitor platelets by anticoagulation protocol: Yes   Plan:  -Warfarin 1mg  PO x1 today -Daily INR, CBC, LDH  Arrie Senate, PharmD, [place], [facility] Clinical Pharmacist [phone] Please check AMION for all [facility] numbers 10/13/2021

## 2021-10-13 NOTE — Plan of Care (Signed)
°  Problem: Health Behavior/Discharge Planning: Goal: Ability to manage health-related needs will improve Outcome: Progressing   Problem: Clinical Measurements: Goal: Ability to maintain clinical measurements within normal limits will improve Outcome: Progressing Goal: Will remain free from infection Outcome: Progressing Goal: Diagnostic test results will improve Outcome: Progressing Goal: Respiratory complications will improve Outcome: Progressing Goal: Cardiovascular complication will be avoided Outcome: Progressing   Problem: Activity: Goal: Risk for activity intolerance will decrease Outcome: Progressing   Problem: Nutrition: Goal: Adequate nutrition will be maintained Outcome: Progressing   Problem: Coping: Goal: Level of anxiety will decrease Outcome: Progressing   Problem: Elimination: Goal: Will not experience complications related to bowel motility Outcome: Progressing Goal: Will not experience complications related to urinary retention Outcome: Progressing   Problem: Pain Managment: Goal: General experience of comfort will improve Outcome: Progressing   Problem: Safety: Goal: Ability to remain free from injury will improve Outcome: Progressing   Problem: Skin Integrity: Goal: Risk for impaired skin integrity will decrease Outcome: Progressing   Problem: Education: Goal: Ability to demonstrate management of disease process will improve Outcome: Progressing Goal: Ability to verbalize understanding of medication therapies will improve Outcome: Progressing Goal: Individualized Educational Video(s) Outcome: Progressing   Problem: Activity: Goal: Capacity to carry out activities will improve Outcome: Progressing   Problem: Cardiac: Goal: Ability to achieve and maintain adequate cardiopulmonary perfusion will improve Outcome: Progressing   Problem: Education: Goal: Knowledge of the prescribed therapeutic regimen will improve Outcome: Progressing    Problem: Activity: Goal: Risk for activity intolerance will decrease Outcome: Progressing   Problem: Cardiac: Goal: Ability to maintain an adequate cardiac output will improve Outcome: Progressing   Problem: Coping: Goal: Level of anxiety will decrease Outcome: Progressing   Problem: Fluid Volume: Goal: Risk for excess fluid volume will decrease Outcome: Progressing   Problem: Clinical Measurements: Goal: Ability to maintain clinical measurements within normal limits will improve Outcome: Progressing Goal: Will remain free from infection Outcome: Progressing   Problem: Respiratory: Goal: Will regain and/or maintain adequate ventilation Outcome: Progressing   Problem: Education: Goal: Knowledge of cardiac device and self-care will improve Outcome: Progressing Goal: Ability to safely manage health related needs after discharge will improve Outcome: Progressing Goal: Individualized Educational Video(s) Outcome: Progressing   Problem: Cardiac: Goal: Ability to achieve and maintain adequate cardiopulmonary perfusion will improve Outcome: Progressing

## 2021-10-13 NOTE — Progress Notes (Signed)
LVAD Coordinator Rounding Note:  Admitted 09/06/21 due to CHF exacerbation to Dr Claris Gladden service. He was started on Milrinone on admission. Febrile n 09/10/21- suspected to have UTI- antibiotics started. Impella 5.5 placed 09/12/21.   HM III LVAD implanted on 09/17/21 by Dr Cyndia Bent under destination therapy criteria. 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   Pt lying in bed resting. Reports he had a good weekend and rested well last night. Pt tells me that he is making very little urine.  CRRT off on Friday, CR up to 4.97 today. Pt scheduled for in room HD today.    Vital signs: Temp: 98.2 HR: 60 AV paced Doppler Pressure: 82 Auto Cuff BP: 112/77 (89) O2 Sat: 98% on 3L [place] Wt: 185.2>196.8>194>189.1>192.2>198.4>189.6>188.7>183.6>177>160.2>157.6 >156.3>154.3>155.4>163.8 lbs    LVAD interrogation reveals:  Speed: 4900 Flow: 3.3 Power: 3.2w PI: 6.1 Hematocrit: 24  Alarms: none Events: 3 today; 3 yesterday  Fixed speed: 4900 Low speed limit: 4600  Drive Line: Existing VAD dressing removed and site care performed using sterile technique. Drive line exit site cleaned with Chlora prep applicators x 2, allowed to dry, rinsed with saline and gauze dressing with silver strip applied. Exit site not incorporated, the velour is fully implanted at exit site.  Scant amount of dried tissue fat necrosis drainage noted at site and on gauze. No redness, tenderness, foul odor or rash noted. Drive line anchor secure. Continue twice weekly dressing changes on Monday/Thursday per Nurse Davonna Belling, [place] coordinator, or trained caregiver.  Next dressing change due: 10/16/21.        Labs:  LDH trend: 344>379>370>378>380....>295>270>277>264>299>261>226  INR trend: 1.6>1.8>2.8>2.9>2.1>2.0>2.2>2.4>2.4>2.5>2.3>1.4>1.5>2.1  WBC trend: 14.1>13.6>13.1>11>8.7>10.1>13.5>14>14.2>14.3>13.2>10.9>11.1>12.4>12.8>11.6>13.8  Cr trend:  2.11>2.39>5.24>3.45>2.38>1.97>1.87>1.68>1.73>1.60>1.49>1.52>1.51>1.57>1.39>1.44>1.39>4.97  Hgb trend: 6.5>7.5>8.1>8.8>7.7>8.6>7.7>7.8>8.5>8.3>8.9>8.1>7.8  Anticoagulation Plan: -INR Goal: 2.0 - 2.5 -ASA Dose: 81 mg daily   Blood Products:  Intra Op: 09/17/21  1 PLT  4 FFP   4 PRBC  Cellsaver- 803 cc Post Op:  - 09/17/21>>1 PRBC - 09/18/21>> 3 PRBCs - 09/19/21>> 1 PRBC -  09/26/21>> 1 PRBC - 10/02/21>>1 PRBC - 10/06/21 >> 1 PRBC  Device: - [employer] dual -Therapies: OFF  *Will need to be referred to device clinic at discharge*  Arrythmias: 3 episodes of VT that required defib while in OR 09/17/21. Currently on Amiodarone gtt. Remains in afib.   Respiratory: extubated 09/18/22  Nitric Oxide:  off 09/18/22  Renal:  09/23/21>>started CVVH 10/10/21>>stopped CVVH  Infection:  09/10/21>> blood cultures >> No growth 5 days; final 09/12/21>> urine culture >> No growth; final  09/25/21>>blood cultures>>negative  09/27/21>>pleural effusion>>negative, final 10/02/21>> pleural effusion >> no growth; final   Drips:  Milrinone 0.125 mcg/kg/min (restarted 10/02/21) Levo 1 mcg/min-off Tube feed 65 cc/hr-off Heparin 500 units/hr-off today  Patient Education: No family at bedside. Patient tired and in bed. Not appropriate for teaching at this time  Plan/Recommendations:  1. [place] VAD coordinator for drive line or equipment concerns 2. Twice a week dressing change per Nurse Davonna Belling or [place] coordinator on Monday and Thursday.  [name] RN [place] Coordinator  Office: [phone]  24/7 Pager: [phone]

## 2021-10-13 NOTE — Progress Notes (Signed)
Inpatient Rehab Admissions Coordinator:   Note off CRRT, trial of iHD.  Will follow for a few more days and rescreen for candidacy.   Shann Medal, PT, DPT Admissions Coordinator [phone] 10/13/21  11:07 AM

## 2021-10-13 NOTE — Progress Notes (Signed)
Patient ID: Isaac [name], male   DOB: [birth year], 71 y.o.   MRN: [id number]  Called to see patient because of increased dyspnea and low flow alarms, flow down to 2.2-2.5.  Decreased speed to 4800 rpm with improvement in flow to around 2.8-3.0.   Bedside echo done, RV severely enlarged with mildly decreased systolic function and severe TR.  The septum is shifted further leftwards than before (LV compression) with severe TR.  CVP > 20.  Patient is volume overloaded on exam. j  CVVH was stopped Friday morning, no HD since that time.  Not able to get HD earlier today it sounds like because of staffing.  Patient now with RV volume overload with left-shifted septum affecting LVAD flow.   - Staff called nephrology, unable to get HD quickly.   - Unfortunately, looks like we will need to resume CVVH.  Will aim to pull net negative 100-150 cc/hr.  Will need to get fluid off again and then get him back to the point where we can do iHD again.  He is very tenuous and will need to make sure that we can prioritize his dialysis scheduling.   CRITICAL CARE Performed by: Loralie Champagne  Total critical care time: 30 minutes  Critical care time was exclusive of separately billable procedures and treating other patients.  Critical care was necessary to treat or prevent imminent or life-threatening deterioration.  Critical care was time spent personally by me on the following activities: development of treatment plan with patient and/or surrogate as well as nursing, discussions with consultants, evaluation of patient's response to treatment, examination of patient, obtaining history from patient or surrogate, ordering and performing treatments and interventions, ordering and review of laboratory studies, ordering and review of radiographic studies, pulse oximetry and re-evaluation of patient's condition.  Loralie Champagne 10/13/2021 4:07 PM

## 2021-10-13 NOTE — Progress Notes (Signed)
Outpatient Heart Failure CSW continuing to follow during implant hospitalization.  CSW collaborating with LVAD coordinators and no needs identified at this time- continuing to follow and assist as needed.  Jorge Ny, LCSW Clinical Social Worker [facility] Desk#: [phone] Cell#: [phone]

## 2021-10-14 DIAGNOSIS — R570 Cardiogenic shock: Secondary | ICD-10-CM | POA: Diagnosis not present

## 2021-10-14 LAB — RENAL FUNCTION PANEL
Albumin: 2.1 g/dL — ABNORMAL LOW (ref 3.5–5.0)
Albumin: 2 g/dL — ABNORMAL LOW (ref 3.5–5.0)
Anion gap: 9 (ref 5–15)
Anion gap: 9 (ref 5–15)
BUN: 38 mg/dL — ABNORMAL HIGH (ref 8–23)
BUN: 26 mg/dL — ABNORMAL HIGH (ref 8–23)
CO2: 25 mmol/L (ref 22–32)
CO2: 25 mmol/L (ref 22–32)
Calcium: 8.2 mg/dL — ABNORMAL LOW (ref 8.9–10.3)
Calcium: 8.2 mg/dL — ABNORMAL LOW (ref 8.9–10.3)
Chloride: 102 mmol/L (ref 98–111)
Chloride: 102 mmol/L (ref 98–111)
Creatinine, Ser: 3.68 mg/dL — ABNORMAL HIGH (ref 0.61–1.24)
Creatinine, Ser: 2.53 mg/dL — ABNORMAL HIGH (ref 0.61–1.24)
GFR, Estimated: 17 mL/min — ABNORMAL LOW (ref >60)
GFR, Estimated: 27 mL/min — ABNORMAL LOW (ref >60)
Glucose, Bld: 131 mg/dL — ABNORMAL HIGH (ref 70–99)
Glucose, Bld: 133 mg/dL — ABNORMAL HIGH (ref 70–99)
Phosphorus: 4.5 mg/dL (ref 2.5–4.6)
Phosphorus: 2.9 mg/dL (ref 2.5–4.6)
Potassium: 5 mmol/L (ref 3.5–5.1)
Potassium: 4.3 mmol/L (ref 3.5–5.1)
Sodium: 136 mmol/L (ref 135–145)
Sodium: 136 mmol/L (ref 135–145)

## 2021-10-14 LAB — GLUCOSE, CAPILLARY
Glucose-Capillary: 100 mg/dL — ABNORMAL HIGH (ref 70–99)
Glucose-Capillary: 119 mg/dL — ABNORMAL HIGH (ref 70–99)
Glucose-Capillary: 123 mg/dL — ABNORMAL HIGH (ref 70–99)
Glucose-Capillary: 131 mg/dL — ABNORMAL HIGH (ref 70–99)
Glucose-Capillary: 92 mg/dL (ref 70–99)
Glucose-Capillary: 94 mg/dL (ref 70–99)

## 2021-10-14 LAB — CBC
HCT: 29.4 % — ABNORMAL LOW (ref 39.0–52.0)
Hemoglobin: 9.2 g/dL — ABNORMAL LOW (ref 13.0–17.0)
MCH: 30.3 pg (ref 26.0–34.0)
MCHC: 31.3 g/dL (ref 30.0–36.0)
MCV: 96.7 fL (ref 80.0–100.0)
Platelets: 656 10*3/uL — ABNORMAL HIGH (ref 150–400)
RBC: 3.04 MIL/uL — ABNORMAL LOW (ref 4.22–5.81)
RDW: 20 % — ABNORMAL HIGH (ref 11.5–15.5)
WBC: 13.2 10*3/uL — ABNORMAL HIGH (ref 4.0–10.5)
nRBC: 0 % (ref 0.0–0.2)

## 2021-10-14 LAB — PROTIME-INR
INR: 2.6 — ABNORMAL HIGH (ref 0.8–1.2)
Prothrombin Time: 27.8 seconds — ABNORMAL HIGH (ref 11.4–15.2)

## 2021-10-14 LAB — COOXEMETRY PANEL
Carboxyhemoglobin: 1.6 % — ABNORMAL HIGH (ref 0.5–1.5)
Methemoglobin: 0.8 % (ref 0.0–1.5)
O2 Saturation: 56.1 %
Total hemoglobin: 13.7 g/dL (ref 12.0–16.0)

## 2021-10-14 LAB — LACTATE DEHYDROGENASE: LDH: 257 U/L — ABNORMAL HIGH (ref 98–192)

## 2021-10-14 LAB — MAGNESIUM: Magnesium: 2.6 mg/dL — ABNORMAL HIGH (ref 1.7–2.4)

## 2021-10-14 MED ORDER — MILRINONE LACTATE IN DEXTROSE 20-5 MG/100ML-% IV SOLN: 0.1250 ug/kg/min | INTRAVENOUS | Status: DC

## 2021-10-14 MED ORDER — WARFARIN 0.5 MG HALF TABLET
0.5000 mg | ORAL_TABLET | Freq: Once | ORAL | Status: AC
  Administered 2021-10-14: 17:00:00 0.5 mg via ORAL
  Filled 2021-10-14: qty 1

## 2021-10-14 MED ORDER — MILRINONE LACTATE IN DEXTROSE 20-5 MG/100ML-% IV SOLN
0.1250 ug/kg/min | INTRAVENOUS | Status: DC
  Administered 2021-10-14 (×2): 0.25 ug/kg/min via INTRAVENOUS
  Administered 2021-10-15 – 2021-10-16 (×2): 0.125 ug/kg/min via INTRAVENOUS
  Filled 2021-10-14 (×3): qty 100

## 2021-10-14 NOTE — Progress Notes (Signed)
Nutrition Follow-up  DOCUMENTATION CODES:   Severe malnutrition in context of chronic illness  INTERVENTION:   Continue liberalized diet  Continue Ensure Enlive po TID, each supplement provides 350 kcal and 20 grams of protein  Continue Renal MVI  NUTRITION DIAGNOSIS:   Severe Malnutrition related to chronic illness (heart failure) as evidenced by severe fat depletion, severe muscle depletion.  Being addressed via supplements  GOAL:   Patient will meet greater than or equal to 90% of their needs  Progressing   MONITOR:   PO intake, Supplement acceptance, Labs, Weight trends  REASON FOR ASSESSMENT:   Consult LVAD Eval  ASSESSMENT:   71 yo male admitted with acute on chronic CHF. PMH includes CHF, CAD, CKD, HLD, HTN   12/23 Impella 5.5 placed 12/28 HM3 LVAD plaecd, Impella removed, Intubated 12/29 Extubated 12/31 FL diet 01/02 Diet advanced to 2g sodium 01/03 CRRT initiated 01/11 Calorie count indicating inadequate oral intake, Cortrak recommended 01/12 Thoracentesis with 1.3 L removed 01/13 Cortrak placed, TF initiated 01/19 Cortrak removed, TF stopped 01/20 CRRT on hold  Pt remains anuric Plan was for iHD on 1/23 but had to be put back on CRRT; plan to stop CRRT on 1/25 then do iHD on 1/26 and 1/28   Appetite improving, pt ate eggs and potatoes this AM; yesterday recorded po intake 10% at breakfast, 15% at lunch, 75% at dinner; noted 50% recorded at 2215. Pt has been drinking some Ensure. Pt continues to eat "solid food" which is an improvement.   Current wt 70.5 kg; lowest weight since admission  Labs: CBG 92-131, phosphorus 2.6, potassium 5.o Meds: aranesp, remeron, midodrine, renal MVI, Lokelma    Diet Order:   Diet Order             Diet vegetarian Room service appropriate? Yes; Fluid consistency: Thin  Diet effective now                   EDUCATION NEEDS:   Education needs have been addressed  Skin:  Skin Assessment: Skin Integrity  Issues: Skin Integrity Issues:: Incisions, Stage II Stage II: sacrum-now healing per RN Incisions: new LVAD  Last BM:  1/17  Height:   Ht Readings from Last 1 Encounters:  10/09/21 5\' 9"  (1.753 m)    Weight:   Wt Readings from Last 1 Encounters:  10/14/21 70.5 kg    Ideal Body Weight:     BMI:  Body mass index is 22.95 kg/m.  Estimated Nutritional Needs:   Kcal:  2300-2500 kcals  Protein:  135-160 g  Fluid:  1.8 L    Kerman Passey MS, RDN, LDN, CNSC Registered Dietitian III Clinical Nutrition RD Pager and On-Call Pager Number Located in [place]

## 2021-10-14 NOTE — Progress Notes (Signed)
LVAD Coordinator Rounding Note:  Admitted 09/06/21 due to CHF exacerbation to Dr Claris Gladden service. He was started on Milrinone on admission. Febrile n 09/10/21- suspected to have UTI- antibiotics started. Impella 5.5 placed 09/12/21.   HM III LVAD implanted on 09/17/21 by Dr Cyndia Bent under destination therapy criteria. 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   Pt lying in bed asleep. Bedside RN reports pt is doing better today. No family at bedside.   6 LOW FLOWS noted overnight. Speed increased to 5000 and Hct dropped to 20 overnight. Milrinone 0.25 mcg/kg/min restarted overnight. Plan to wean Milrinone to 0.125 mcg/kg/min today. No further low flows noted this morning.   CRRT restarted 10/13/21 due to inability to have iHD. CRRT pulling 100-150 cc/hr. Per nephrology note from today plan will be to stop CRRT 1/25 and have iHD 1/26 and 1/28. Creatinine 3.68 today.   Vital signs: Temp: 97.7 HR: 60 AV paced Doppler Pressure: 82 Auto Cuff BP: 112/74 (86) O2 Sat: 98% on 2L [place] Wt: 185.2>196.8>194>189.1>192.2>198.4>189.6>188.7>183.6>177>160.2>157.6>156.3>154.3>155.4>163.8>155.4 lbs    LVAD interrogation reveals:  Speed: 5000 Flow: 3.3 Power: 3.4 w PI: 7.6 Hematocrit: 20-- DO NOT ADJUST  Alarms: 6 LOW FLOW alarms overnight Events: 4 PI events so far today  Fixed speed: 5000 Low speed limit: 4700  Drive Line: Existing VAD dressing clean, dry, and intact. Drive line anchor secure. Continue twice weekly dressing changes on Monday/Thursday per Nurse Davonna Belling, [place] coordinator, or trained caregiver.  Next dressing change due: 10/16/21.   Labs:  LDH trend: 344>379>370>378>380....>295>270>277>264>299>261>226>257  INR trend: 1.6>1.8>2.8>2.9>2.1>2.0>2.2>2.4>2.4>2.5>2.3>1.4>1.5>2.1>2.6  WBC trend: 14.1>13.6>13.1>11>8.7>10.1>13.5>14>14.2>14.3>13.2>10.9>11.1>12.4>12.8>11.6>13.8>13.2  Cr trend:  2.11>2.39>5.24>3.45>2.38>1.97>1.87>1.68>1.73>1.60>1.49>1.52>1.51>1.57>1.39>1.44>1.39>4.97>3.68  Hgb trend: 6.5>7.5>8.1>8.8>7.7>8.6>7.7>7.8>8.5>8.3>8.9>8.1>7.8>9.2  Anticoagulation Plan: -INR Goal: 2.0 - 2.5 -ASA Dose: 81 mg daily   Blood Products:  Intra Op: 09/17/21  1 PLT  4 FFP   4 PRBC  Cellsaver- 803 cc Post Op:  - 09/17/21>>1 PRBC - 09/18/21>> 3 PRBCs - 09/19/21>> 1 PRBC -  09/26/21>> 1 PRBC - 10/02/21>>1 PRBC - 10/06/21 >> 1 PRBC  Device: - [employer] dual - AV paced 60 -Therapies: OFF  *Will need to be referred to device clinic at discharge*  Arrythmias: 3 episodes of VT that required defib while in OR 09/17/21. Currently on Amiodarone gtt. Remains in afib.   Respiratory: extubated 09/18/22  Nitric Oxide:  off 09/18/22  Renal:  09/23/21>>started CVVH 10/10/21>>stopped CVVH 10/13/21>> restarted CVVH  Infection:  09/10/21>> blood cultures >> No growth 5 days; final 09/12/21>> urine culture >> No growth; final  09/25/21>>blood cultures>>negative  09/27/21>>pleural effusion>>negative, final 10/02/21>> pleural effusion >> no growth; final   Drips:  Milrinone 0.125 mcg/kg/min (restarted 10/13/21)  Patient Education: No family at bedside. Patient asleep. Not appropriate for teaching at this time  Plan/Recommendations:  1. [place] VAD coordinator for drive line or equipment concerns 2. Twice a week dressing change per Nurse Davonna Belling or [place] coordinator on Monday and Thursday.  Emerson Monte RN [place] Coordinator  Office: [phone]  24/7 Pager: [phone]

## 2021-10-14 NOTE — TOC CM/SW Note (Signed)
HF TOC CM/CSW continues to follow for dc needs. Plan is for CIR when medically stable. [place], Heart Failure TOC CM [phone]

## 2021-10-14 NOTE — Progress Notes (Addendum)
Patient ID: Isaac [name], male   DOB: 18-Feb-1951, 71 y.o.   MRN: [id number]    Advanced Heart Failure VAD Team Note  PCP-Cardiologist: None   Subjective:    12/19 RHC- RA 7, PA 39/14 (25), PCWP 11, CO 6.4 CI 3. Thermo 3.5 1.8. Lasix drip stopped after cath. 12/20 Swan removed.  Norepi 3 mcg added.  12/21 Fever. Blood CX drawn. UA + leukocytes. Started on vanc and cefepime. Diuresed with IV lasix + metolazone.  12/23 Impella 5.5 placed 12/28 HM III LVAD, ICD leads plastered to tricuspid valve with severe TR, valve not replaced and some improvement in TR with LVAD placement.  12/29 Extubated 01/03 Started CRRT 01/07  L thoracentesis and placement of [facility] 01/09 LVAD speed increased to 5600 rpm 01/10 Milrinone stopped. Amio gtt>>PO. Completed 5 day course Cefepime.  01/12 Ramp echo done: Moderate lateral pericardial effusion w/o tamponade, RV larger with severe dilation/moderate dysfunction and left shift of the septum, LV small and undershifted.  TR severe.  LVAD speed decreased to 5000 rpms at 2 different sessions.  Also had thoracentesis on left with 1.3 L off.  01/13 TFs begun 01/16 echo reviewed: EF 25-30%, LV larger than prior but septum still with left shift, RV moderately dilated and moderately dysfunctional, moderate-severe TR, mod-large localized pericardial effusion lateral to the LV.  1/18 Ramp Echo with speed decreased to 4900  1/21 CVVHD stopped 1/23 delay in iHD due to staffing, developed increased dyspnea + volume overload CVP >20 and low flow alarms. VAD speed reduced to 4800. CVVH restarted. Milrinone restarted 0.25.   Feels better today. Getting CVVH w/ 2L volume removal yesterday. Currently pulling 100/150/hj. CVP down to 16. No resting dyspnea. Remains anuric.   6 Low Flow Alarms early morning hours ~2:30 AM. No further events.   Co-ox 56% on milrinone 0.25.   Hgb 9.2 , INR 2.6   LVAD INTERROGATION:  HeartMate III LVAD:   Flow 3.8  liters/min, speed 5000, power  3.4 PI  5.1. 6 Low Flow alarms early morning hours.  Several PI events. VAD interrogated personally. Parameters stable.     Objective:    Vital Signs:   Temp:  [97.2 F (36.2 C)-98.2 F (36.8 C)] 97.7 F (36.5 C) (01/24 0655) Pulse Rate:  [52-141] 141 (01/24 0700) Resp:  [1-23] 18 (01/24 0700) BP: (99-142)/(69-101) 107/76 (01/24 0700) SpO2:  [79 %-100 %] 79 % (01/24 0700) Weight:  [70.5 kg] 70.5 kg (01/24 0500) Last BM Date: 10/12/21 Mean arterial Pressure 80s   Intake/Output:   Intake/Output Summary (Last 24 hours) at 10/14/2021 0732 Last data filed at 10/14/2021 0724 Gross per 24 hour  Intake 895.03 ml  Output 2066 ml  Net -1170.97 ml     Physical Exam   CVP 16  General:  well appearing, sitting up in bed. No respiratory distress  HEENT: normal  Neck: supple. JVP to jaw.  Carotids 2+ bilat; no bruits. No lymphadenopathy or thryomegaly appreciated. Cor: LVAD hum.  Lungs: faint crackles LLL, otherwise  clear. No wheezing  Abdomen: obese soft, nontender, non-distended. No hepatosplenomegaly. No bruits or masses. Good bowel sounds. Driveline site clean. Anchor in place.  Extremities: no cyanosis, clubbing, rash. Warm no edema + unna boots  Neuro: alert & oriented x 3. No focal deficits. Moves all 4 without problem    Telemetry   AV pacing 60s-70s Personally reviewed  Labs   Basic Metabolic Panel: Recent Labs  Lab 10/10/21 0437 10/10/21 1714 10/11/21 0421 10/11/21 1200 10/12/21  6073 10/13/21 0353 10/14/21 0401  NA 135   < > 133* 133* 132* 132* 136  K 5.1   < > 6.1* 5.3* 5.5* 5.6* 5.0  CL 99   < > 100 98 97* 98 102  CO2 27   < > 27 24 24 23 25   GLUCOSE 132*   < > 127* 130* 121* 109* 131*  BUN 25*   < > 33* 40* 49* 60* 38*  CREATININE 1.39*   < > 2.58* 3.03* 3.91* 4.97* 3.68*  CALCIUM 7.2*   < > 8.5* 8.8* 8.6* 8.5* 8.2*  MG 2.5*  --  2.5*  --  2.6* 2.5* 2.6*  PHOS 2.5   < > 4.1 4.3 5.5* 6.9* 4.5   < > = values in this interval not displayed.    Liver  Function Tests: Recent Labs  Lab 10/11/21 0421 10/11/21 1200 10/12/21 0447 10/13/21 0353 10/14/21 0401  ALBUMIN 2.0* 2.0* 1.9* 1.9* 2.1*   No results for input(s): LIPASE, AMYLASE in the last 168 hours. No results for input(s): AMMONIA in the last 168 hours.   CBC: Recent Labs  Lab 10/11/21 0421 10/12/21 0447 10/12/21 0916 10/13/21 0353 10/14/21 0401  WBC 13.4* 15.9* 15.0* 13.8* 13.2*  HGB 8.5* 7.1* 8.2* 7.8* 9.2*  HCT 27.8* 22.7* 26.5* 24.7* 29.4*  MCV 97.9 97.0 95.7 95.4 96.7  PLT 509* 576* 539* 599* 656*    INR: Recent Labs  Lab 10/10/21 0437 10/11/21 0421 10/12/21 0447 10/13/21 0353 10/14/21 0401  INR 2.1* 2.0* 2.4* 2.5* 2.6*    Other results:   Imaging   No results found.   Medications:     Scheduled Medications:  sodium chloride   Intravenous Once   sodium chloride   Intravenous Once   sodium chloride   Intravenous Once   sodium chloride   Intravenous Once   amiodarone  200 mg Oral BID   aspirin  81 mg Oral Daily   Chlorhexidine Gluconate Cloth  6 each Topical Daily   Chlorhexidine Gluconate Cloth  6 each Topical Q0600   darbepoetin (ARANESP) injection - NON-DIALYSIS  150 mcg Subcutaneous Q Mon-1800   docusate sodium  100 mg Oral BID   feeding supplement  237 mL Oral TID BM   mouth rinse  15 mL Mouth Rinse BID   midodrine  20 mg Oral TID WC   mirtazapine  7.5 mg Oral QHS   montelukast  5 mg Oral QHS   multivitamin  1 tablet Oral QHS   pantoprazole  40 mg Oral Daily   rosuvastatin  10 mg Oral Daily   senna  2 tablet Oral QHS   sodium chloride flush  10-40 mL Intracatheter Q12H   sodium chloride flush  3 mL Intravenous Q12H   sodium zirconium cyclosilicate  10 g Oral BID   Warfarin - Pharmacist Dosing Inpatient   Does not apply q1600    Infusions:   prismasol BGK 4/2.5 500 mL/hr at 10/14/21 0313    prismasol BGK 4/2.5 300 mL/hr at 10/13/21 1702   sodium chloride Stopped (09/20/21 0304)   sodium chloride Stopped (09/18/21 0803)    sodium chloride Stopped (10/02/21 0900)   milrinone 0.25 mcg/kg/min (10/14/21 0700)   prismasol BGK 4/2.5 1,500 mL/hr at 10/14/21 0642   promethazine (PHENERGAN) injection (IM or IVPB) Stopped (10/10/21 1755)    PRN Medications: sodium chloride, sodium chloride, acetaminophen, albuterol, Gerhardt's butt cream, heparin, lip balm, ondansetron (ZOFRAN) IV, oxyCODONE, polyethylene glycol, promethazine (PHENERGAN) injection (IM or IVPB),  sodium chloride, sodium chloride flush, sodium chloride flush, traMADol   Assessment/Plan:    1. Acute on chronic systolic CHF:  Long-standing cardiomyopathy.  [place].  Echo this admission with EF < 20%, severe LV dilation, restrictive diastolic function, moderate RV dysfunction, moderate MR, mod-severe TR. Cause of cardiomyopathy is uncertain.  He has a history of CAD, but I do not think that the described CAD from the past could explain his cardiomyopathy, but CAD could have progressed.  With difficulty tolerating GDMT/need for midodrine and cardiorenal syndrome as well as profound volume overload + NYHA class IV symptoms,  concerned for low output HF. Co-ox off milrinone was 36%, milrinone started and increased to 0.375 but CO remained low. NE added and Impella 5.5 placed 12/23. EF 10% on TEE 12/23. s/p HM III VAD on 12/28.  Ramp echo 01/03 with EF 20-25%, RV moderately reduced, septum mid-line.  Ramp echo again on 1/12 showed severely dilated/moderately dysfunctional RV with severe TR and left-shifted septum with small LV.  Speed decreased on 1/12 to 5000 rpm over 2 sessions due to RV failure and milrinone 0.25 + NE 3 restarted.  Echo repeated on 1/16, RV looks better and LV larger but still with left shift of the septum.  Able to increase CVVH, weight down significantly.  Echo again on 1/18, speed decreased to 4900 with RV mildly dysfunctional and severely enlarged with severe TR.  CVVHD stopped 1/20.  1/23 delay in iHD due to staffing, developed increased  dyspnea + volume overload CVP >20 and low flow alarms. VAD speed reduced to 4800. CVVH restarted. Milrinone restarted 0.25. - Co-ox 56% on milrinone 0.25  - On CVVH pulling 100-150/hr. CVP 16  - Continue midodrine 20 mg tid.   - He does not have BP room for sildenafil.  - Warfarin for INR 2-2.5.  INR 2.6 today Discussed dosing with PharmD personally. - Continue 81 mg aspirin - Continue UNNA boots 2. Tricuspid regurgitation:  Tricuspid repair not done at time of VAD d/t proximity of ICD wires and hypotension during surgery.  He has severe TR.  3. Atrial fibrillation: Now in NSR - Continue PO amio 200 bid  4. AKI on CKD stage 3: Gradual rise (was about 1.7 pre-op) since surgery.  He has not been hypotensive post-op, suspect intra-op hypotension led to development of ATN => urine sediment looked like ATN per renal. Got CVVH. TDC placed 1/7.  - Maintain MAP and CO as above - Continue CVVH for fluid removal, hopefully iHD soon  - Appreciate Nephrology input 5. CAD: History of PCI to OM1 in 2007 and RCA in 2013.  No CP or ACs.  - Deferred cardiac cath due to AKI and plan for VAD - No s/s angina  - Continue Crestor.  6. ID: Had fever 12/21, PCT 1.03.  Blood cultures negative. ?Phlebitis at IV site. Post-op fever with elevated WBCs, PCT 2.63 -> 1.78.  He completed course of cefepime. Afebrile with stable WBCs.   7. Constipation: Resolved.  8. Hyponatremia: Hypervolemic hyponatremia.  Controlled via CVVH.  9. Anemia: 1 unit PRBCs on 1/12,  1 unit PRBs 1/13, 1 unit 1/16. No overt bleeding, ?losses via CVVH.  Hgb 9.2 today, no evidence bleeding - Transfuse for Hgb <7.5  - Received IV iron x 5 doses per nephrology (finished 1/20) 10. Confusion/delirium: Initially post-op and started on Precedex.  Resolved.  11. F/E/N: TFs via Cortrack for poor nutritional status.  Improving po intake.  - TF off. Ate well  yesterday. Continue to monitor. - Prealbumin 7.8>9.5.  - Added remeron to help with appetite.   12. Left pleural effusion: - s/p thoracentesis 1/7 - s/p thoracentesis 1/12. 13. Pericardial effusion: Stable moderate pericardial effusion, primarily localized to along the lateral wall of the LV.  Does not appear to be affecting the RV/doubt tamponade.  Discussed after 1/12 and 1/18 echo with Dr. Cyndia Bent, conservative management for now.  14. Right great toe pain: - ? Possible gout.  - Uric acid 13.3 -> 1.6 (?) 15. Hyperkalemia - Management per Renal. K 5.0 today - Receiving Lokelma - Continue CVVHD   Will eventually need acute inpatient rehab -> once he tolerates iHD  Lyda Jester, PA-C  7:32 AM  Patient seen with PA, agree with the above note.   Patient is doing better this morning with volume removal.  CVP down to 16-17 witih co-ox 56% on milrinone 0.25, LVAD speed back up to 5000.  I/Os negative overnight, weight down.   General: Well appearing this am. NAD.  HEENT: Normal. Neck: Supple, JVP 14 cm cm. Carotids OK.  Cardiac:  Mechanical heart sounds with LVAD hum present.  Lungs:  CTAB, normal effort.  Abdomen:  NT, ND, no HSM. No bruits or masses. +BS  LVAD exit site: Well-healed and incorporated. Dressing dry and intact. No erythema or drainage. Stabilization device present and accurately applied. Driveline dressing changed daily per sterile technique. Extremities:  Warm and dry. No cyanosis, clubbing, rash, or edema.  Neuro:  Alert & oriented x 3. Cranial nerves grossly intact. Moves all 4 extremities w/o difficulty. Affect pleasant    Patient got volume overloaded with worsening RV enlargement and compression of LV yesterday in setting of prolonged period off HD.  CVP remains 16-17.  - Will continue CVVH today for volume removal, aim for UF net 100-150 cc/hr.  Hopefully by tomorrow will be ready to stop CVVH and make transition to iHD.   MAP stable, co-ox 56%.  Will decrease milrinone to 0.125 with goal to stop it hopefully tomorrow.   CRITICAL CARE Performed by:  Loralie Champagne  Total critical care time: 40 minutes  Critical care time was exclusive of separately billable procedures and treating other patients.  Critical care was necessary to treat or prevent imminent or life-threatening deterioration.  Critical care was time spent personally by me on the following activities: development of treatment plan with patient and/or surrogate as well as nursing, discussions with consultants, evaluation of patient's response to treatment, examination of patient, obtaining history from patient or surrogate, ordering and performing treatments and interventions, ordering and review of laboratory studies, ordering and review of radiographic studies, pulse oximetry and re-evaluation of patient's condition.  Loralie Champagne 10/14/2021 8:04 AM

## 2021-10-14 NOTE — Progress Notes (Signed)
[facility] Progress Note     Assessment/ Plan:   Acute kidney injury on CKD3b: with a baseline cr in the 1.6-2 range. Acute component either from cardiorenal +/- ATN with decreased renal function from hemodynamics and CPB + hypotensive episodes + pressors.  - CRRT started on 1/3 - stopped mid day 1/20. Has been anuric and BUN-CRT rising-  plan was  for IHD on 1/23-  but delayed and decision was made to put back on CRRT -  keep CRRT through day-  plan to stop on 1/25-  then do IHD on 1/26 and 1/28 to keep out of trouble -  has tunneled HD cath in place   Cardiogenic shock: s/p LVAD 09/17/22 (HM 3).   - on milrinone and midodrine now, off NE now  CAD with h/o PCI last in 2013  Anemia- iron indices 1/16 showing iron sat 12%, ferritin 477, Aranesp  60 on 10/06/21. Gave IV iron x 1g over 5 doses. Hgb falling-  have inc darbe  Afib- on amio  Acute hypoxic RF: s/p thoracentesis 1.3 L 10/02/21  Hyperkalemia:  K consistently over 5, on lokelma BID. He's vegetarian/pescatarian and has really struggled to maintain nutrition during admission - for now won't formally restrict diet but may need to revisit  Dispo: ICU, ultimately will need to be able to tolerate iHD, moving in that direction now.  Should he require milrinone + HD this would be a major concern for potential disposition.  Subjective:    There ended up being too much of a delay with getting HD yesterday so decision was made to place back on CRRT-  feels better -  negative 1500 overnight-  CVP down to 16.  No UOP recorded-  he says he is going a little     Objective:   BP (!) 148/133 (BP Location: Left Arm)    Pulse (!) 141    Temp 97.7 F (36.5 C) (Oral)    Resp (!) 21    Ht 5\' 9"  (1.753 m)    Wt 70.5 kg    SpO2 98%    BMI 22.95 kg/m   Intake/Output Summary (Last 24 hours) at 10/14/2021 0258 Last data filed at 10/14/2021 0800 Gross per 24 hour  Intake 892.01 ml  Output 2305 ml  Net -1412.99 ml   Weight change:  -3.8 kg  Physical Exam: GEN: NAD, sleeping HEENT: wearing glasses LUNGS: normal WOB, clear ant CV: LVAD  EXT: minimal edema, much improved, legs wrapped at tibial level ACCESS: rt IJ TC c/d/I-     Imaging: No results found.  Labs: BMET Recent Labs  Lab 10/10/21 0437 10/10/21 1714 10/11/21 0421 10/11/21 1200 10/12/21 0447 10/13/21 0353 10/14/21 0401  NA 135 134* 133* 133* 132* 132* 136  K 5.1 5.3* 6.1* 5.3* 5.5* 5.6* 5.0  CL 99 102 100 98 97* 98 102  CO2 27 26 27 24 24 23 25   GLUCOSE 132* 114* 127* 130* 121* 109* 131*  BUN 25* 25* 33* 40* 49* 60* 38*  CREATININE 1.39* 1.78* 2.58* 3.03* 3.91* 4.97* 3.68*  CALCIUM 7.2* 8.1* 8.5* 8.8* 8.6* 8.5* 8.2*  PHOS 2.5 2.6 4.1 4.3 5.5* 6.9* 4.5   CBC Recent Labs  Lab 10/12/21 0447 10/12/21 0916 10/13/21 0353 10/14/21 0401  WBC 15.9* 15.0* 13.8* 13.2*  HGB 7.1* 8.2* 7.8* 9.2*  HCT 22.7* 26.5* 24.7* 29.4*  MCV 97.0 95.7 95.4 96.7  PLT 576* 539* 599* 656*    Medications:     sodium  chloride   Intravenous Once   sodium chloride   Intravenous Once   sodium chloride   Intravenous Once   sodium chloride   Intravenous Once   amiodarone  200 mg Oral BID   aspirin  81 mg Oral Daily   Chlorhexidine Gluconate Cloth  6 each Topical Daily   Chlorhexidine Gluconate Cloth  6 each Topical Q0600   darbepoetin (ARANESP) injection - NON-DIALYSIS  150 mcg Subcutaneous Q Mon-1800   docusate sodium  100 mg Oral BID   feeding supplement  237 mL Oral TID BM   mouth rinse  15 mL Mouth Rinse BID   midodrine  20 mg Oral TID WC   mirtazapine  7.5 mg Oral QHS   montelukast  5 mg Oral QHS   multivitamin  1 tablet Oral QHS   pantoprazole  40 mg Oral Daily   rosuvastatin  10 mg Oral Daily   senna  2 tablet Oral QHS   sodium chloride flush  10-40 mL Intracatheter Q12H   sodium chloride flush  3 mL Intravenous Q12H   sodium zirconium cyclosilicate  10 g Oral BID   Warfarin - Pharmacist Dosing Inpatient   Does not apply [facility] Pager [phone]

## 2021-10-14 NOTE — Progress Notes (Signed)
Physical Therapy Treatment Patient Details Name: Isaac [name] MRN: [id number] DOB: 1950-11-30 Today's Date: 10/14/2021   History of Present Illness Pt is a 71 y.o. male admitted 09/07/21 with near-syncope, nausea, poor oral intake, generalized weakness. Workup for advanced heart failure and persistent inotropic with requirement, cardiogenic shock. [place] 12/19. Impella placed on 12/23. LVAD placed 12/28. Pt with post-op delirium and agitation. CRRT initiated 1/3. S/p thoracentesis 1/12. S/p cortrak placement 1/13. CRRT stopped 1/20.  Pt back on CRRT on 1/23 due to staffing of HD. PMH includes advanced CHF (LVEF <20%), AICD/PPM, PAF on Eliquis, CKD 3, HTN, HLD.    PT Comments    Pt making good progress with mobility. Strength and endurance improving. Pt able to change from batteries back to power module with supervision. Continue to recommend acute inpatient rehab to allow pt to regain independence with mobility as well as independence being able to manage his LVAD.    Recommendations for follow up therapy are one component of a multi-disciplinary discharge planning process, led by the attending physician.  Recommendations may be updated based on patient status, additional functional criteria and insurance authorization.  Follow Up Recommendations  Acute inpatient rehab (3hours/day)     Assistance Recommended at Discharge Frequent or constant Supervision/Assistance  Patient can return home with the following A little help with walking and/or transfers;Help with stairs or ramp for entrance;A little help with bathing/dressing/bathroom   Equipment Recommendations  Rollator (4 wheels);BSC/3in1    Recommendations for Other Services       Precautions / Restrictions Precautions Precautions: Fall;Sternal;Other (comment) Precaution Comments: LVAD Restrictions Other Position/Activity Restrictions: Sternal Precautions     Mobility  Bed Mobility Overal bed mobility: Needs Assistance Bed  Mobility: Supine to Sit, Sit to Supine     Supine to sit: Min guard Sit to supine: Min guard   General bed mobility comments: Assist for safety/lines. Able to sit up without use of UE's    Transfers Overall transfer level: Needs assistance Equipment used: Rollator (4 wheels) Transfers: Sit to/from Stand Sit to Stand: Min assist           General transfer comment: Min A for power up    Ambulation/Gait Ambulation/Gait assistance: Min guard Gait Distance (Feet): 400 Feet Assistive device: Rollator (4 wheels) Gait Pattern/deviations: Step-through pattern, Decreased stride length Gait velocity: decr Gait velocity interpretation: 1.31 - 2.62 ft/sec, indicative of limited community ambulator   General Gait Details: Assist for safety/llines   Stairs             Wheelchair Mobility    Modified Rankin (Stroke Patients Only)       Balance Overall balance assessment: Needs assistance Sitting-balance support: No upper extremity supported, Feet supported Sitting balance-Leahy Scale: Fair     Standing balance support: No upper extremity supported, During functional activity Standing balance-Leahy Scale: Fair                              Cognition Arousal/Alertness: Awake/alert Behavior During Therapy: WFL for tasks assessed/performed Overall Cognitive Status: Impaired/Different from baseline Area of Impairment: Memory, Problem solving                     Memory: Decreased short-term memory, Decreased recall of precautions       Problem Solving: Requires verbal cues          Exercises      General Comments General comments (skin integrity, edema, etc.):  VSS on RA, SpO2 99% on RA after amb      Pertinent Vitals/Pain Pain Assessment Pain Assessment: No/denies pain    Home Living                          Prior Function            PT Goals (current goals can now be found in the care plan section) Progress towards  PT goals: Progressing toward goals    Frequency    Min 3X/week      PT Plan Current plan remains appropriate    Co-evaluation              AM-PAC PT "6 Clicks" Mobility   Outcome Measure  Help needed turning from your back to your side while in a flat bed without using bedrails?: A Little Help needed moving from lying on your back to sitting on the side of a flat bed without using bedrails?: A Little Help needed moving to and from a bed to a chair (including a wheelchair)?: A Little Help needed standing up from a chair using your arms (e.g., wheelchair or bedside chair)?: A Little Help needed to walk in hospital room?: A Little Help needed climbing 3-5 steps with a railing? : A Lot 6 Click Score: 17    End of Session   Activity Tolerance: Patient tolerated treatment well Patient left: in bed;with call bell/phone within reach (bed in chair position) Nurse Communication: Mobility status (nurse present) PT Visit Diagnosis: Unsteadiness on feet (R26.81);Other abnormalities of gait and mobility (R26.89);Muscle weakness (generalized) (M62.81)     Time: 3612-2449 PT Time Calculation (min) (ACUTE ONLY): 36 min  Charges:  $Gait Training: 23-37 mins                     [place] Pager [phone] Office [place] 10/14/2021, 4:08 PM

## 2021-10-14 NOTE — Progress Notes (Signed)
[place] for Warfarin Indication: LVAD/AF  Allergies  Allergen Reactions   Mushroom Extract Complex Nausea And Vomiting   Neosporin [Neomycin-Bacitracin Zn-Polymyx] Hives    Patient Measurements: Height: 5\' 9"  (175.3 cm) Weight: 70.5 kg (155 lb 6.8 oz) IBW/kg (Calculated) : 70.7 Heparin Dosing Weight: 83.7 kg  Vital Signs: Temp: 97.7 F (36.5 C) (01/24 0655) Temp Source: Oral (01/24 0655) BP: 107/76 (01/24 0700) Pulse Rate: 141 (01/24 0700)  Labs: Recent Labs    10/12/21 0447 10/12/21 0916 10/13/21 0353 10/14/21 0401  HGB 7.1* 8.2* 7.8* 9.2*  HCT 22.7* 26.5* 24.7* 29.4*  PLT 576* 539* 599* 656*  LABPROT 26.0*  --  26.7* 27.8*  INR 2.4*  --  2.5* 2.6*  CREATININE 3.91*  --  4.97* 3.68*     Estimated Creatinine Clearance: 18.9 mL/min (A) (by C-G formula based on SCr of 3.68 mg/dL (H)).   Medical History: Past Medical History:  Diagnosis Date   Arrhythmia    atrial fibrillation   CHF (congestive heart failure) (HCC)    Chronic kidney disease    Coronary artery disease    Hyperlipidemia    Hypertension    Myocardial infarct [facility])     Assessment: 71 yo male on chronic Eliquis PTA for afib.  Now s/p LVAD placement 09/17/21, pharmacy asked to begin anticoagulation with warfarin.   Patient previously therapeutic on lower doses 1-2mg  daily prior to tube feed being started for poor nutritional intake.   INR today is slightly supratherapeutic at 2.6, CBC and LDH stable. Pt endorses increasing appetite and eating well this morning. No signs or symptoms of bleeding.  Goal of Therapy:  INR 2-2.5 Monitor platelets by anticoagulation protocol: Yes   Plan:  -Warfarin 0.5 mg PO x1 today -Daily INR, CBC, LDH   Varney Daily, PharmD PGY1 Pharmacy Resident  Please check AMION for all [facility] pharmacy phone numbers After 10:00 PM call main pharmacy [phone]

## 2021-10-15 DIAGNOSIS — R570 Cardiogenic shock: Secondary | ICD-10-CM | POA: Diagnosis not present

## 2021-10-15 DIAGNOSIS — Z95811 Presence of heart assist device: Secondary | ICD-10-CM | POA: Diagnosis not present

## 2021-10-15 LAB — CBC
HCT: 27.9 % — ABNORMAL LOW (ref 39.0–52.0)
Hemoglobin: 8.9 g/dL — ABNORMAL LOW (ref 13.0–17.0)
MCH: 30.8 pg (ref 26.0–34.0)
MCHC: 31.9 g/dL (ref 30.0–36.0)
MCV: 96.5 fL (ref 80.0–100.0)
Platelets: 615 K/uL — ABNORMAL HIGH (ref 150–400)
RBC: 2.89 MIL/uL — ABNORMAL LOW (ref 4.22–5.81)
RDW: 19.9 % — ABNORMAL HIGH (ref 11.5–15.5)
WBC: 10 K/uL (ref 4.0–10.5)
nRBC: 0 % (ref 0.0–0.2)

## 2021-10-15 LAB — COOXEMETRY PANEL
Carboxyhemoglobin: 1.9 % — ABNORMAL HIGH (ref 0.5–1.5)
Carboxyhemoglobin: 1.6 % — ABNORMAL HIGH (ref 0.5–1.5)
Carboxyhemoglobin: 1.6 % — ABNORMAL HIGH (ref 0.5–1.5)
Methemoglobin: 0.8 % (ref 0.0–1.5)
Methemoglobin: 0.9 % (ref 0.0–1.5)
Methemoglobin: 0.9 % (ref 0.0–1.5)
O2 Saturation: 51.2 %
O2 Saturation: 43.7 %
O2 Saturation: 45.3 %
Total hemoglobin: 10.9 g/dL — ABNORMAL LOW (ref 12.0–16.0)
Total hemoglobin: 9.6 g/dL — ABNORMAL LOW (ref 12.0–16.0)
Total hemoglobin: 11 g/dL — ABNORMAL LOW (ref 12.0–16.0)

## 2021-10-15 LAB — RENAL FUNCTION PANEL
Albumin: 2 g/dL — ABNORMAL LOW (ref 3.5–5.0)
Anion gap: 8 (ref 5–15)
BUN: 20 mg/dL (ref 8–23)
CO2: 25 mmol/L (ref 22–32)
Calcium: 8.3 mg/dL — ABNORMAL LOW (ref 8.9–10.3)
Chloride: 99 mmol/L (ref 98–111)
Creatinine, Ser: 2.16 mg/dL — ABNORMAL HIGH (ref 0.61–1.24)
GFR, Estimated: 32 mL/min — ABNORMAL LOW (ref >60)
Glucose, Bld: 91 mg/dL (ref 70–99)
Phosphorus: 2.8 mg/dL (ref 2.5–4.6)
Potassium: 4.3 mmol/L (ref 3.5–5.1)
Sodium: 132 mmol/L — ABNORMAL LOW (ref 135–145)

## 2021-10-15 LAB — MAGNESIUM: Magnesium: 2.6 mg/dL — ABNORMAL HIGH (ref 1.7–2.4)

## 2021-10-15 LAB — GLUCOSE, CAPILLARY
Glucose-Capillary: 102 mg/dL — ABNORMAL HIGH (ref 70–99)
Glucose-Capillary: 119 mg/dL — ABNORMAL HIGH (ref 70–99)
Glucose-Capillary: 77 mg/dL (ref 70–99)
Glucose-Capillary: 82 mg/dL (ref 70–99)
Glucose-Capillary: 83 mg/dL (ref 70–99)
Glucose-Capillary: 90 mg/dL (ref 70–99)
Glucose-Capillary: 99 mg/dL (ref 70–99)

## 2021-10-15 LAB — LACTATE DEHYDROGENASE: LDH: 238 U/L — ABNORMAL HIGH (ref 98–192)

## 2021-10-15 LAB — BRAIN NATRIURETIC PEPTIDE
B Natriuretic Peptide: 1113.9 pg/mL — ABNORMAL HIGH (ref 0.0–100.0)
B Natriuretic Peptide: 1173.8 pg/mL — ABNORMAL HIGH (ref 0.0–100.0)

## 2021-10-15 LAB — PROTIME-INR
INR: 2.2 — ABNORMAL HIGH (ref 0.8–1.2)
Prothrombin Time: 24.5 seconds — ABNORMAL HIGH (ref 11.4–15.2)

## 2021-10-15 MED ORDER — SODIUM ZIRCONIUM CYCLOSILICATE 10 G PO PACK
10.0000 g | PACK | Freq: Every day | ORAL | Status: DC
  Administered 2021-10-15: 10:00:00 10 g via ORAL
  Filled 2021-10-15 (×2): qty 1

## 2021-10-15 MED ORDER — CHLORHEXIDINE GLUCONATE CLOTH 2 % EX PADS: 6.0000 | MEDICATED_PAD | Freq: Every day | CUTANEOUS | Status: DC

## 2021-10-15 MED ORDER — MIDODRINE HCL 5 MG PO TABS
15.0000 mg | ORAL_TABLET | Freq: Three times a day (TID) | ORAL | Status: DC
  Administered 2021-10-15 – 2021-10-17 (×6): 15 mg via ORAL
  Filled 2021-10-15 (×7): qty 3

## 2021-10-15 MED ORDER — WARFARIN SODIUM 1 MG PO TABS
1.0000 mg | ORAL_TABLET | Freq: Once | ORAL | Status: AC
  Administered 2021-10-15: 17:00:00 1 mg via ORAL
  Filled 2021-10-15: qty 1

## 2021-10-15 NOTE — Progress Notes (Addendum)
Patient ID: Isaac [name], male   DOB: Aug 10, 1951, 71 y.o.   MRN: [id number]    Advanced Heart Failure VAD Team Note  PCP-Cardiologist: None   Subjective:    12/19 RHC- RA 7, PA 39/14 (25), PCWP 11, CO 6.4 CI 3. Thermo 3.5 1.8. Lasix drip stopped after cath. 12/20 Swan removed.  Norepi 3 mcg added.  12/21 Fever. Blood CX drawn. UA + leukocytes. Started on vanc and cefepime. Diuresed with IV lasix + metolazone.  12/23 Impella 5.5 placed 12/28 HM III LVAD, ICD leads plastered to tricuspid valve with severe TR, valve not replaced and some improvement in TR with LVAD placement.  12/29 Extubated 01/03 Started CRRT 01/07  L thoracentesis and placement of [facility] 01/09 LVAD speed increased to 5600 rpm 01/10 Milrinone stopped. Amio gtt>>PO. Completed 5 day course Cefepime.  01/12 Ramp echo done: Moderate lateral pericardial effusion w/o tamponade, RV larger with severe dilation/moderate dysfunction and left shift of the septum, LV small and undershifted.  TR severe.  LVAD speed decreased to 5000 rpms at 2 different sessions.  Also had thoracentesis on left with 1.3 L off.  01/13 TFs begun 01/16 echo reviewed: EF 25-30%, LV larger than prior but septum still with left shift, RV moderately dilated and moderately dysfunctional, moderate-severe TR, mod-large localized pericardial effusion lateral to the LV.  1/18 Ramp Echo with speed decreased to 4900  1/21 CVVHD stopped 1/23 delay in iHD due to staffing, developed increased dyspnea + volume overload CVP >20 and low flow alarms. VAD speed reduced to 4800 then increased back to 5000. CVVH restarted. Milrinone restarted 0.25.   Currently on Milrinone 0.125. Co-ox 51%.  4.2L fluid removal yesterday off CVVH. Wt down 5 lb. CVVH stopped ~6 AM today after filter clotted. CVP 10-11.   1 Low flow alarm overnight (2.4). LDH ok 238. INR 2.2   Feels better. No resting dyspnea. Appetite is good.    LVAD INTERROGATION:  HeartMate III LVAD:   Flow 4.4  liters/min, speed 5000, power 3.5 PI  3.6. 1 Low Flow alarm overnight. 2 PI events VAD interrogated personally. Parameters stable.     Objective:    Vital Signs:   Temp:  [97.4 F (36.3 C)-98.3 F (36.8 C)] 97.4 F (36.3 C) (01/25 0650) Pulse Rate:  [58-184] 77 (01/25 0700) Resp:  [11-27] 23 (01/25 0700) BP: (87-148)/(62-133) 87/75 (01/25 0700) SpO2:  [84 %-100 %] 84 % (01/25 0700) Weight:  [68.4 kg] 68.4 kg (01/25 0500) Last BM Date: 10/12/21 Mean arterial Pressure 80s   Intake/Output:   Intake/Output Summary (Last 24 hours) at 10/15/2021 0749 Last data filed at 10/15/2021 0500 Gross per 24 hour  Intake 398.09 ml  Output 4261 ml  Net -3862.91 ml     Physical Exam   CVP 11 General:  well appearing No respiratory distress  HEENT: normal  Neck: supple. JVP 10 cm  Carotids 2+ bilat; no bruits. No lymphadenopathy or thryomegaly appreciated. Cor: LVAD hum.  Lungs: CTAB Abdomen: obese soft, nontender, non-distended. No hepatosplenomegaly. No bruits or masses. Good bowel sounds. Driveline site clean. Anchor in place.  Extremities: no cyanosis, clubbing, rash. Warm no edema + unna boots  Neuro: alert & oriented x 3. No focal deficits. Moves all 4 without problem    Telemetry   AV pacing 60s-70s Personally reviewed  Labs   Basic Metabolic Panel: Recent Labs  Lab 10/11/21 0421 10/11/21 1200 10/12/21 0447 10/13/21 0353 10/14/21 0401 10/14/21 1934 10/15/21 0356  NA 133*   < >  132* 132* 136 136 132*  K 6.1*   < > 5.5* 5.6* 5.0 4.3 4.3  CL 100   < > 97* 98 102 102 99  CO2 27   < > 24 23 25 25 25   GLUCOSE 127*   < > 121* 109* 131* 133* 91  BUN 33*   < > 49* 60* 38* 26* 20  CREATININE 2.58*   < > 3.91* 4.97* 3.68* 2.53* 2.16*  CALCIUM 8.5*   < > 8.6* 8.5* 8.2* 8.2* 8.3*  MG 2.5*  --  2.6* 2.5* 2.6*  --  2.6*  PHOS 4.1   < > 5.5* 6.9* 4.5 2.9 2.8   < > = values in this interval not displayed.    Liver Function Tests: Recent Labs  Lab 10/12/21 0447 10/13/21 0353  10/14/21 0401 10/14/21 1934 10/15/21 0356  ALBUMIN 1.9* 1.9* 2.1* 2.0* 2.0*   No results for input(s): LIPASE, AMYLASE in the last 168 hours. No results for input(s): AMMONIA in the last 168 hours.   CBC: Recent Labs  Lab 10/12/21 0447 10/12/21 0916 10/13/21 0353 10/14/21 0401 10/15/21 0356  WBC 15.9* 15.0* 13.8* 13.2* 10.0  HGB 7.1* 8.2* 7.8* 9.2* 8.9*  HCT 22.7* 26.5* 24.7* 29.4* 27.9*  MCV 97.0 95.7 95.4 96.7 96.5  PLT 576* 539* 599* 656* 615*    INR: Recent Labs  Lab 10/11/21 0421 10/12/21 0447 10/13/21 0353 10/14/21 0401 10/15/21 0356  INR 2.0* 2.4* 2.5* 2.6* 2.2*    Other results:   Imaging   No results found.   Medications:     Scheduled Medications:  sodium chloride   Intravenous Once   sodium chloride   Intravenous Once   sodium chloride   Intravenous Once   sodium chloride   Intravenous Once   amiodarone  200 mg Oral BID   aspirin  81 mg Oral Daily   Chlorhexidine Gluconate Cloth  6 each Topical Q0600   darbepoetin (ARANESP) injection - NON-DIALYSIS  150 mcg Subcutaneous Q Mon-1800   docusate sodium  100 mg Oral BID   feeding supplement  237 mL Oral TID BM   mouth rinse  15 mL Mouth Rinse BID   midodrine  20 mg Oral TID WC   mirtazapine  7.5 mg Oral QHS   montelukast  5 mg Oral QHS   multivitamin  1 tablet Oral QHS   pantoprazole  40 mg Oral Daily   rosuvastatin  10 mg Oral Daily   senna  2 tablet Oral QHS   sodium chloride flush  10-40 mL Intracatheter Q12H   sodium chloride flush  3 mL Intravenous Q12H   sodium zirconium cyclosilicate  10 g Oral BID   Warfarin - Pharmacist Dosing Inpatient   Does not apply q1600    Infusions:   prismasol BGK 4/2.5 500 mL/hr at 10/15/21 0103    prismasol BGK 4/2.5 300 mL/hr at 10/15/21 0455   sodium chloride Stopped (09/20/21 0304)   sodium chloride Stopped (09/18/21 0803)   sodium chloride Stopped (10/02/21 0900)   milrinone 0.125 mcg/kg/min (10/15/21 0400)   prismasol BGK 4/2.5 1,500 mL/hr at  10/15/21 0417   promethazine (PHENERGAN) injection (IM or IVPB) Stopped (10/10/21 1755)    PRN Medications: sodium chloride, sodium chloride, acetaminophen, albuterol, Gerhardt's butt cream, heparin, lip balm, ondansetron (ZOFRAN) IV, oxyCODONE, polyethylene glycol, promethazine (PHENERGAN) injection (IM or IVPB), sodium chloride, sodium chloride flush, sodium chloride flush, traMADol   Assessment/Plan:    1. Acute on chronic systolic CHF:  Long-standing cardiomyopathy.  [place].  Echo this admission with EF < 20%, severe LV dilation, restrictive diastolic function, moderate RV dysfunction, moderate MR, mod-severe TR. Cause of cardiomyopathy is uncertain.  He has a history of CAD, but I do not think that the described CAD from the past could explain his cardiomyopathy, but CAD could have progressed.  With difficulty tolerating GDMT/need for midodrine and cardiorenal syndrome as well as profound volume overload + NYHA class IV symptoms,  concerned for low output HF. Co-ox off milrinone was 36%, milrinone started and increased to 0.375 but CO remained low. NE added and Impella 5.5 placed 12/23. EF 10% on TEE 12/23. s/p HM III VAD on 12/28.  Ramp echo 01/03 with EF 20-25%, RV moderately reduced, septum mid-line.  Ramp echo again on 1/12 showed severely dilated/moderately dysfunctional RV with severe TR and left-shifted septum with small LV.  Speed decreased on 1/12 to 5000 rpm over 2 sessions due to RV failure and milrinone 0.25 + NE 3 restarted.  Echo repeated on 1/16, RV looks better and LV larger but still with left shift of the septum.  Able to increase CVVH, weight down significantly.  Echo again on 1/18, speed decreased to 4900 with RV mildly dysfunctional and severely enlarged with severe TR.  CVVHD stopped 1/20.  1/23 delay in iHD due to staffing, developed increased dyspnea + volume overload CVP >20 and low flow alarms. VAD speed reduced to 4800. CVVH restarted. Milrinone restarted  0.25. Volume much improved. CVP down to 10-11 today - Co-ox 51% on milrinone 0.125  - CVVH stopped this morning. Plan to transition to [facility] tomorrow.  - Continue midodrine 20 mg tid.   - He does not have BP room for sildenafil.  - Warfarin for INR 2-2.5.  INR 2.2 today Discussed dosing with PharmD personally. - Continue 81 mg aspirin - Continue UNNA boots 2. Tricuspid regurgitation:  Tricuspid repair not done at time of VAD d/t proximity of ICD wires and hypotension during surgery.  He has severe TR.  3. Atrial fibrillation: Now in NSR - Continue PO amio 200 bid  4. AKI on CKD stage 3: Gradual rise (was about 1.7 pre-op) since surgery.  He has not been hypotensive post-op, suspect intra-op hypotension led to development of ATN => urine sediment looked like ATN per renal. Got CVVH. TDC placed 1/7. Now off CVVH. Plan transition to [facility] 1/26  - Maintain MAP and CO as above - Appreciate Nephrology input 5. CAD: History of PCI to OM1 in 2007 and RCA in 2013.  No CP or ACs.  - Deferred cardiac cath due to AKI and plan for VAD - No s/s angina  - Continue Crestor.  6. ID: Had fever 12/21, PCT 1.03.  Blood cultures negative. ?Phlebitis at IV site. Post-op fever with elevated WBCs, PCT 2.63 -> 1.78.  He completed course of cefepime. Afebrile with stable WBCs.   7. Constipation: Resolved.  8. Hyponatremia: Hypervolemic hyponatremia.  Controlled via CVVH.  9. Anemia: 1 unit PRBCs on 1/12,  1 unit PRBs 1/13, 1 unit 1/16. No overt bleeding, ?losses via CVVH.  Hgb 8.9 today, no evidence bleeding - Transfuse for Hgb <7.5  - Received IV iron x 5 doses per nephrology (finished 1/20) 10. Confusion/delirium: Initially post-op and started on Precedex.  Resolved.  11. F/E/N: TFs via [place] for poor nutritional status.  Improving po intake.  - TF off. Ate well yesterday. Continue to monitor. - Prealbumin 7.8>9.5.  - Added remeron to  help with appetite.  12. Left pleural effusion: - s/p thoracentesis 1/7 -  s/p thoracentesis 1/12. 13. Pericardial effusion: Stable moderate pericardial effusion, primarily localized to along the lateral wall of the LV.  Does not appear to be affecting the RV/doubt tamponade.  Discussed after 1/12 and 1/18 echo with Dr. Cyndia Bent, conservative management for now.  14. Right great toe pain: - ? Possible gout.  - Uric acid 13.3 -> 1.6 (?) 15. Hyperkalemia - Resolved w/ HD and lokelma. K ok 4.3 today    16. Deconditioning:  - will need CIR once medically ready  - ambulate today   Lyda Jester, PA-C  7:49 AM  Patient seen with PA, agree with the above note.   CVVH stopped this morning, CVP 10-11.  Early am co-ox 51.2% on milrinone 0.125.  1 low flow event on LVAD check this morning around the time CVVH stopped.   MAP stable on midodrine 20 tid.   General: Well appearing this am. NAD.  HEENT: Normal. Neck: Supple, JVP 10 cm. Carotids OK.  Cardiac:  Mechanical heart sounds with LVAD hum present.  Lungs:  CTAB, normal effort.  Abdomen:  NT, ND, no HSM. No bruits or masses. +BS  LVAD exit site: Well-healed and incorporated. Dressing dry and intact. No erythema or drainage. Stabilization device present and accurately applied. Driveline dressing changed daily per sterile technique. Extremities:  Warm and dry. No cyanosis, clubbing, rash, or edema.  Neuro:  Alert & oriented x 3. Cranial nerves grossly intact. Moves all 4 extremities w/o difficulty. Affect pleasant    Will repeat co-ox, if > 55% can stop milrinone.  MAP improved, can decrease midodrine to 15 mg tid.   Stopping CVVH today, will need iHD first thing tomorrow morning to avoid excess fluid accumulation.   Walk today in halls.    Eating better.   If he does well with transition to [facility], can go to step down.   CRITICAL CARE Performed by: Loralie Champagne  Total critical care time: 35 minutes  Critical care time was exclusive of separately billable procedures and treating other patients.  Critical  care was necessary to treat or prevent imminent or life-threatening deterioration.  Critical care was time spent personally by me on the following activities: development of treatment plan with patient and/or surrogate as well as nursing, discussions with consultants, evaluation of patient's response to treatment, examination of patient, obtaining history from patient or surrogate, ordering and performing treatments and interventions, ordering and review of laboratory studies, ordering and review of radiographic studies, pulse oximetry and re-evaluation of patient's condition.  Loralie Champagne 10/15/2021 8:17 AM

## 2021-10-15 NOTE — Progress Notes (Signed)
[facility] Progress Note     Assessment/ Plan:   Acute kidney injury on CKD3b: with a baseline cr in the 1.6-2 range. Acute component either from cardiorenal +/- ATN with decreased renal function from hemodynamics and CPB + hypotensive episodes + pressors.  - CRRT started on 1/3 - stopped mid day 1/20. Has been anuric and BUN-CRT rising-  plan was  for IHD on 1/23-  but delayed and decision was made to put back on CRRT -   plan to stop today-  then do IHD on 1/26 and 1/28 to keep out of trouble -  has tunneled HD cath in place.  Now has been HD dep for 3 weeks-  the longer this goes the less likely a possibility for recovery- LVAD and HD is complicated but not impossible-  have not made any OP plans as of yet-  when tolerates IHD will start to move in that direction   Cardiogenic shock: s/p LVAD 09/17/22 (HM 3).   - on milrinone and midodrine now, off NE   CAD with h/o PCI last in 2013  Anemia- iron indices 1/16 showing iron sat 12%, ferritin 477, Aranesp  60 on 10/06/21. Gave IV iron x 1g over 5 doses. Hgb falling-  have inc darbe  Afib- on amio  Acute hypoxic RF: s/p thoracentesis 1.3 L 10/02/21  Hyperkalemia:  K consistently over 5, on lokelma BID. He's vegetarian/pescatarian and has really struggled to maintain nutrition during admission - for now won't formally restrict diet but may need to revisit-  k has been good of late-  decrease lokelma to once daily  Bones-  phos Ok now just off of CRRT-  watch for binder need-  will also check PTH   Dispo: ICU, ultimately will need to be able to tolerate iHD, moving in that direction now.  Should he require milrinone+ LVAD + HD this would be a concern for potential disposition.  Subjective:     CRRT ran well- stopped at 0400-   negative 3500 overnight and CVP down in the low teens-  some soft BPs-   No UOP recorded-      Objective:   BP (!) 87/75    Pulse 77    Temp (!) 97.4 F (36.3 C) (Oral)    Resp (!) 23    Ht 5\' 9"   (1.753 m)    Wt 68.4 kg    SpO2 (!) 84%    BMI 22.27 kg/m   Intake/Output Summary (Last 24 hours) at 10/15/2021 4782 Last data filed at 10/15/2021 0500 Gross per 24 hour  Intake 398.09 ml  Output 4261 ml  Net -3862.91 ml   Weight change: -2.1 kg  Physical Exam: GEN: NAD, sleeping HEENT: wearing glasses LUNGS: normal WOB, clear ant CV: LVAD  EXT: minimal edema, much improved, legs wrapped at tibial level ACCESS: rt IJ TC c/d/I-     Imaging: No results found.  Labs: BMET Recent Labs  Lab 10/11/21 0421 10/11/21 1200 10/12/21 0447 10/13/21 0353 10/14/21 0401 10/14/21 1934 10/15/21 0356  NA 133* 133* 132* 132* 136 136 132*  K 6.1* 5.3* 5.5* 5.6* 5.0 4.3 4.3  CL 100 98 97* 98 102 102 99  CO2 27 24 24 23 25 25 25   GLUCOSE 127* 130* 121* 109* 131* 133* 91  BUN 33* 40* 49* 60* 38* 26* 20  CREATININE 2.58* 3.03* 3.91* 4.97* 3.68* 2.53* 2.16*  CALCIUM 8.5* 8.8* 8.6* 8.5* 8.2* 8.2* 8.3*  PHOS 4.1 4.3 5.5*  6.9* 4.5 2.9 2.8   CBC Recent Labs  Lab 10/12/21 0916 10/13/21 0353 10/14/21 0401 10/15/21 0356  WBC 15.0* 13.8* 13.2* 10.0  HGB 8.2* 7.8* 9.2* 8.9*  HCT 26.5* 24.7* 29.4* 27.9*  MCV 95.7 95.4 96.7 96.5  PLT 539* 599* 656* 615*    Medications:     sodium chloride   Intravenous Once   sodium chloride   Intravenous Once   sodium chloride   Intravenous Once   sodium chloride   Intravenous Once   amiodarone  200 mg Oral BID   aspirin  81 mg Oral Daily   Chlorhexidine Gluconate Cloth  6 each Topical Q0600   darbepoetin (ARANESP) injection - NON-DIALYSIS  150 mcg Subcutaneous Q Mon-1800   docusate sodium  100 mg Oral BID   feeding supplement  237 mL Oral TID BM   mouth rinse  15 mL Mouth Rinse BID   midodrine  20 mg Oral TID WC   mirtazapine  7.5 mg Oral QHS   montelukast  5 mg Oral QHS   multivitamin  1 tablet Oral QHS   pantoprazole  40 mg Oral Daily   rosuvastatin  10 mg Oral Daily   senna  2 tablet Oral QHS   sodium chloride flush  10-40 mL  Intracatheter Q12H   sodium chloride flush  3 mL Intravenous Q12H   sodium zirconium cyclosilicate  10 g Oral BID   Warfarin - Pharmacist Dosing Inpatient   Does not apply [facility] Pager [phone]

## 2021-10-15 NOTE — Progress Notes (Addendum)
LVAD Coordinator Rounding Note:  Admitted 09/06/21 due to CHF exacerbation to Dr Claris Gladden service. He was started on Milrinone on admission. Febrile 09/10/21- suspected to have UTI- antibiotics started. Impella 5.5 placed 09/12/21.   HM III LVAD implanted on 09/17/21 by Dr Cyndia Bent under destination therapy criteria. 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   Pt sitting up in bed watching TV upon my arrival. Denies complaints other than dry mouth. Provided with CHG mouth rinse and oral gel. He is excited to be off CRRT and starting iHD tomorrow morning.  Reports he walked a full lap in the hallway with nursing staff this morning.   Received page overnight for 1 LOW FLOW around 0235. Pt was asymptomatic at that time. Vitals and VAD numbers stable. CRRT pulling 150-160 cc/hr at that time. Discussed pulling a little less with CRRT, and no further low flows observed overnight.   CRRT stopped this morning around 0600 when filter clotted. Plan for iHD 1/26 & 1/28. Creatinine 2.16 today.   Remains on Milrinone 0.125 mcg/kg/min. Co-ox 51 this morning, with repeat of 45.   No family at bedside this morning.   Vital signs: Temp: 97.4 HR: 60 AV paced Doppler Pressure: not documented Auto Cuff BP: 111/65 (79) O2 Sat: 98% on RA Wt: 185.2>196.8>194>189.1>192.2>198.4>189.6>188.7>183.6>177>160.2>157.6>156.3>154.3> 155.4>163.8>155.4>150.7 lbs    LVAD interrogation reveals:  Speed: 5000 Flow: 3.6 Power: 3.4 w PI: 5.5 Hematocrit: 20-- DO NOT ADJUST (reminder placed on VAD cart)  Alarms: 1 LOW FLOW overnight Events: 5 PI events so far today  Fixed speed: 5000 Low speed limit: 4700  Drive Line: Existing VAD dressing clean, dry, and intact. Drive line anchor secure. Continue twice weekly dressing changes on Monday/Thursday per Nurse Davonna Belling, [place] coordinator, or trained caregiver.  Next dressing change due: 10/16/21.    Labs:  LDH trend: 344>379>370>378>380....>295>270>277>264>299>261>226>257>238  INR trend: 1.6>1.8>2.8>2.9>2.1>2.0>2.2>2.4>2.4>2.5>2.3>1.4>1.5>2.1>2.6>2.2  WBC trend: 14.1>13.6>13.1>11>8.7>10.1>13.5>14>14.2>14.3>13.2>10.9>11.1>12.4>12.8>11.6>13.8>13.2>10  Cr trend: 2.11>2.39>5.24>3.45>2.38>1.97>1.87>1.68>1.73>1.60>1.49>1.52>1.51>1.57>1.39>1.44>1.39>4.97>3.68>2.16  Hgb trend: 6.5>7.5>8.1>8.8>7.7>8.6>7.7>7.8>8.5>8.3>8.9>8.1>7.8>9.2>8.9  Anticoagulation Plan: -INR Goal: 2.0 - 2.5 -ASA Dose: 81 mg daily   Blood Products:  Intra Op: 09/17/21  1 PLT  4 FFP   4 PRBC  Cellsaver- 803 cc Post Op:  - 09/17/21>>1 PRBC - 09/18/21>> 3 PRBCs - 09/19/21>> 1 PRBC -  09/26/21>> 1 PRBC - 10/02/21>>1 PRBC - 10/06/21 >> 1 PRBC  Device: - [employer] dual - AV paced 60 -Therapies: OFF  *Will need to be referred to device clinic at discharge*  Arrythmias: 3 episodes of VT that required defib while in OR 09/17/21. Currently on Amiodarone gtt. Remains in afib.   Respiratory: extubated 09/18/22  Nitric Oxide:  off 09/18/22  Renal:  09/23/21>>started CVVH 10/10/21>>stopped CVVH 10/13/21>> restarted CVVH 10/15/21>> stopped CVVH  Infection:  09/10/21>> blood cultures >> No growth 5 days; final 09/12/21>> urine culture >> No growth; final  09/25/21>>blood cultures>>negative  09/27/21>>pleural effusion>>negative, final 10/02/21>> pleural effusion >> no growth; final   Drips:  Milrinone 0.125 mcg/kg/min (restarted 10/13/21)  Patient Education: No family at bedside. Pt reports he is able to successfully change power sources independently.   Plan/Recommendations:  1. Page VAD coordinator for drive line or equipment concerns 2. Twice a week dressing change per Nurse Davonna Belling or [place] coordinator on Monday and Thursday.  Emerson Monte RN [place] Coordinator  Office: [phone]  24/7 Pager: [phone]

## 2021-10-15 NOTE — Progress Notes (Signed)
[place] for Warfarin Indication: LVAD/AF  Allergies  Allergen Reactions   Mushroom Extract Complex Nausea And Vomiting   Neosporin [Neomycin-Bacitracin Zn-Polymyx] Hives    Patient Measurements: Height: 5\' 9"  (175.3 cm) Weight: 70.5 kg (155 lb 6.8 oz) IBW/kg (Calculated) : 70.7 Heparin Dosing Weight: 83.7 kg  Vital Signs: Temp: 98.2 F (36.8 C) (01/25 0400) Temp Source: Oral (01/25 0400) BP: 113/83 (01/25 0500) Pulse Rate: 59 (01/25 0500)  Labs: Recent Labs    10/13/21 0353 10/14/21 0401 10/14/21 1934 10/15/21 0356  HGB 7.8* 9.2*  --  8.9*  HCT 24.7* 29.4*  --  27.9*  PLT 599* 656*  --  615*  LABPROT 26.7* 27.8*  --  24.5*  INR 2.5* 2.6*  --  2.2*  CREATININE 4.97* 3.68* 2.53* 2.16*     Estimated Creatinine Clearance: 32.2 mL/min (A) (by C-G formula based on SCr of 2.16 mg/dL (H)).   Medical History: Past Medical History:  Diagnosis Date   Arrhythmia    atrial fibrillation   CHF (congestive heart failure) (HCC)    Chronic kidney disease    Coronary artery disease    Hyperlipidemia    Hypertension    Myocardial infarct [facility])     Assessment: 71 yo male on chronic Eliquis PTA for afib.  Now s/p LVAD placement 09/17/21, pharmacy asked to begin anticoagulation with warfarin.   Patient previously therapeutic on lower doses 1-2mg  daily prior to tube feed being started for poor nutritional intake.   INR today is therapeutic at 2.2, CBC and LDH stable. Pt endorses increasing appetite and eating well this morning. No signs or symptoms of bleeding.  Goal of Therapy:  INR 2-2.5 Monitor platelets by anticoagulation protocol: Yes   Plan:  -Warfarin 1 mg PO x1 today -Daily INR, CBC, LDH   Varney Daily, PharmD PGY1 Pharmacy Resident  Please check AMION for all [facility] pharmacy phone numbers After 10:00 PM call main pharmacy [phone]

## 2021-10-16 DIAGNOSIS — R570 Cardiogenic shock: Secondary | ICD-10-CM | POA: Diagnosis not present

## 2021-10-16 DIAGNOSIS — Z95811 Presence of heart assist device: Secondary | ICD-10-CM | POA: Diagnosis not present

## 2021-10-16 DIAGNOSIS — I5043 Acute on chronic combined systolic (congestive) and diastolic (congestive) heart failure: Secondary | ICD-10-CM | POA: Diagnosis not present

## 2021-10-16 LAB — COOXEMETRY PANEL
Carboxyhemoglobin: 1.7 % — ABNORMAL HIGH (ref 0.5–1.5)
Carboxyhemoglobin: 2.1 % — ABNORMAL HIGH (ref 0.5–1.5)
Methemoglobin: 0.9 % (ref 0.0–1.5)
Methemoglobin: 0.9 % (ref 0.0–1.5)
O2 Saturation: 47.7 %
O2 Saturation: 52.1 %
Total hemoglobin: 8.8 g/dL — ABNORMAL LOW (ref 12.0–16.0)
Total hemoglobin: 8.2 g/dL — ABNORMAL LOW (ref 12.0–16.0)

## 2021-10-16 LAB — CBC
HCT: 27.7 % — ABNORMAL LOW (ref 39.0–52.0)
HCT: 28.7 % — ABNORMAL LOW (ref 39.0–52.0)
Hemoglobin: 8.8 g/dL — ABNORMAL LOW (ref 13.0–17.0)
Hemoglobin: 9 g/dL — ABNORMAL LOW (ref 13.0–17.0)
MCH: 30.2 pg (ref 26.0–34.0)
MCH: 29.3 pg (ref 26.0–34.0)
MCHC: 31.8 g/dL (ref 30.0–36.0)
MCHC: 31.4 g/dL (ref 30.0–36.0)
MCV: 95.2 fL (ref 80.0–100.0)
MCV: 93.5 fL (ref 80.0–100.0)
Platelets: 627 10*3/uL — ABNORMAL HIGH (ref 150–400)
Platelets: 706 10*3/uL — ABNORMAL HIGH (ref 150–400)
RBC: 2.91 MIL/uL — ABNORMAL LOW (ref 4.22–5.81)
RBC: 3.07 MIL/uL — ABNORMAL LOW (ref 4.22–5.81)
RDW: 19.2 % — ABNORMAL HIGH (ref 11.5–15.5)
RDW: 18.8 % — ABNORMAL HIGH (ref 11.5–15.5)
WBC: 9.5 10*3/uL (ref 4.0–10.5)
WBC: 8.6 10*3/uL (ref 4.0–10.5)
nRBC: 0 % (ref 0.0–0.2)
nRBC: 0 % (ref 0.0–0.2)

## 2021-10-16 LAB — PROTIME-INR
INR: 1.7 — ABNORMAL HIGH (ref 0.8–1.2)
Prothrombin Time: 20.3 seconds — ABNORMAL HIGH (ref 11.4–15.2)

## 2021-10-16 LAB — RENAL FUNCTION PANEL
Albumin: 2.1 g/dL — ABNORMAL LOW (ref 3.5–5.0)
Albumin: 2.2 g/dL — ABNORMAL LOW (ref 3.5–5.0)
Anion gap: 9 (ref 5–15)
Anion gap: 11 (ref 5–15)
BUN: 32 mg/dL — ABNORMAL HIGH (ref 8–23)
BUN: 13 mg/dL (ref 8–23)
CO2: 24 mmol/L (ref 22–32)
CO2: 27 mmol/L (ref 22–32)
Calcium: 8.8 mg/dL — ABNORMAL LOW (ref 8.9–10.3)
Calcium: 8.4 mg/dL — ABNORMAL LOW (ref 8.9–10.3)
Chloride: 102 mmol/L (ref 98–111)
Chloride: 94 mmol/L — ABNORMAL LOW (ref 98–111)
Creatinine, Ser: 3.41 mg/dL — ABNORMAL HIGH (ref 0.61–1.24)
Creatinine, Ser: 2.01 mg/dL — ABNORMAL HIGH (ref 0.61–1.24)
GFR, Estimated: 19 mL/min — ABNORMAL LOW (ref >60)
GFR, Estimated: 35 mL/min — ABNORMAL LOW (ref >60)
Glucose, Bld: 98 mg/dL (ref 70–99)
Glucose, Bld: 117 mg/dL — ABNORMAL HIGH (ref 70–99)
Phosphorus: 4 mg/dL (ref 2.5–4.6)
Phosphorus: 2.6 mg/dL (ref 2.5–4.6)
Potassium: 4.2 mmol/L (ref 3.5–5.1)
Potassium: 3.9 mmol/L (ref 3.5–5.1)
Sodium: 135 mmol/L (ref 135–145)
Sodium: 132 mmol/L — ABNORMAL LOW (ref 135–145)

## 2021-10-16 LAB — GLUCOSE, CAPILLARY
Glucose-Capillary: 83 mg/dL (ref 70–99)
Glucose-Capillary: 94 mg/dL (ref 70–99)
Glucose-Capillary: 101 mg/dL — ABNORMAL HIGH (ref 70–99)
Glucose-Capillary: 57 mg/dL — ABNORMAL LOW (ref 70–99)
Glucose-Capillary: 69 mg/dL — ABNORMAL LOW (ref 70–99)
Glucose-Capillary: 84 mg/dL (ref 70–99)
Glucose-Capillary: 106 mg/dL — ABNORMAL HIGH (ref 70–99)

## 2021-10-16 LAB — MAGNESIUM: Magnesium: 2.4 mg/dL (ref 1.7–2.4)

## 2021-10-16 LAB — LACTATE DEHYDROGENASE: LDH: 229 U/L — ABNORMAL HIGH (ref 98–192)

## 2021-10-16 MED ORDER — HEPARIN SODIUM (PORCINE) 1000 UNIT/ML DIALYSIS
1000.0000 [IU] | INTRAMUSCULAR | Status: DC | PRN
  Administered 2021-10-16: 3000 [IU] via INTRAVENOUS_CENTRAL
  Administered 2021-10-18: 3800 [IU] via INTRAVENOUS_CENTRAL
  Administered 2021-10-24: 1000 [IU] via INTRAVENOUS_CENTRAL
  Filled 2021-10-16 (×3): qty 1

## 2021-10-16 MED ORDER — SODIUM CHLORIDE 0.9 % IV SOLN: 100.0000 mL | INTRAVENOUS | Status: DC | PRN

## 2021-10-16 MED ORDER — LIDOCAINE HCL (PF) 1 % IJ SOLN: 5.0000 mL | INTRAMUSCULAR | Status: DC | PRN

## 2021-10-16 MED ORDER — LIDOCAINE-PRILOCAINE 2.5-2.5 % EX CREA
1.0000 "application " | TOPICAL_CREAM | CUTANEOUS | Status: DC | PRN
  Filled 2021-10-16: qty 5

## 2021-10-16 MED ORDER — WARFARIN SODIUM 2 MG PO TABS
2.0000 mg | ORAL_TABLET | Freq: Once | ORAL | Status: AC
  Administered 2021-10-16: 2 mg via ORAL
  Filled 2021-10-16: qty 1

## 2021-10-16 MED ORDER — CHLORHEXIDINE GLUCONATE CLOTH 2 % EX PADS
6.0000 | MEDICATED_PAD | Freq: Every day | CUTANEOUS | Status: DC
  Administered 2021-10-16 – 2021-10-17 (×2): 6 via TOPICAL

## 2021-10-16 MED ORDER — HEPARIN SODIUM (PORCINE) 1000 UNIT/ML DIALYSIS: 1000.0000 [IU] | INTRAMUSCULAR | Status: DC | PRN

## 2021-10-16 MED ORDER — LIDOCAINE-PRILOCAINE 2.5-2.5 % EX CREA: 1.0000 "application " | TOPICAL_CREAM | CUTANEOUS | Status: DC | PRN

## 2021-10-16 MED ORDER — ALTEPLASE 2 MG IJ SOLR
2.0000 mg | Freq: Once | INTRAMUSCULAR | Status: DC | PRN
  Filled 2021-10-16: qty 2

## 2021-10-16 MED ORDER — PENTAFLUOROPROP-TETRAFLUOROETH EX AERO
1.0000 "application " | INHALATION_SPRAY | CUTANEOUS | Status: DC | PRN
  Filled 2021-10-16: qty 116

## 2021-10-16 MED ORDER — PENTAFLUOROPROP-TETRAFLUOROETH EX AERO: 1.0000 "application " | INHALATION_SPRAY | CUTANEOUS | Status: DC | PRN

## 2021-10-16 MED ORDER — HEPARIN SODIUM (PORCINE) 1000 UNIT/ML IJ SOLN
INTRAMUSCULAR | Status: AC
  Filled 2021-10-16: qty 3

## 2021-10-16 MED ORDER — ALTEPLASE 2 MG IJ SOLR: 2.0000 mg | Freq: Once | INTRAMUSCULAR | Status: DC | PRN

## 2021-10-16 NOTE — Progress Notes (Signed)
°  Hypoglycemic Event  CBG: 57  Treatment: 8 oz juice/soda  Symptoms: None  Follow-up CBG: Time:1706 CBG Result:69  Follow-up CBG: Time:1725 CBG Result:84  Possible Reasons for Event: Inadequate meal intake and Other: Pt had food held for initial Hemodialysis(HD) treatment this morning, hypoglycemic after HD  Comments/MD notified: Standing orders protocol followed   [name]

## 2021-10-16 NOTE — Progress Notes (Addendum)
Patient ID: Isaac [name], male   DOB: [birth year], 71 y.o.   MRN: [id number]    Advanced Heart Failure VAD Team Note  PCP-Cardiologist: None   Subjective:    12/19 RHC- RA 7, PA 39/14 (25), PCWP 11, CO 6.4 CI 3. Thermo 3.5 1.8. Lasix drip stopped after cath. 12/20 Swan removed.  Norepi 3 mcg added.  12/21 Fever. Blood CX drawn. UA + leukocytes. Started on vanc and cefepime. Diuresed with IV lasix + metolazone.  12/23 Impella 5.5 placed 12/28 HM III LVAD, ICD leads plastered to tricuspid valve with severe TR, valve not replaced and some improvement in TR with LVAD placement.  12/29 Extubated 01/03 Started CRRT 01/07  L thoracentesis and placement of [facility] 01/09 LVAD speed increased to 5600 rpm 01/10 Milrinone stopped. Amio gtt>>PO. Completed 5 day course Cefepime.  01/12 Ramp echo done: Moderate lateral pericardial effusion w/o tamponade, RV larger with severe dilation/moderate dysfunction and left shift of the septum, LV small and undershifted.  TR severe.  LVAD speed decreased to 5000 rpms at 2 different sessions.  Also had thoracentesis on left with 1.3 L off.  01/13 TFs begun 01/16 echo reviewed: EF 25-30%, LV larger than prior but septum still with left shift, RV moderately dilated and moderately dysfunctional, moderate-severe TR, mod-large localized pericardial effusion lateral to the LV.  1/18 Ramp Echo with speed decreased to 4900  1/21 CVVHD stopped 1/23 delay in iHD due to staffing, developed increased dyspnea + volume overload CVP >20 and low flow alarms. VAD speed reduced to 4800 then increased back to 5000. CVVH restarted. Milrinone restarted 0.25.  1/25 CVVH stopped  Currently on Milrinone 0.125. Co-ox remains low 48% but looks and feels well. UOP picking up. 100 cc charted yesterday. On for first iHD session today.   Wt up 4 lb today. CVP 12. Only slight occasional dyspnea. Has been ambulating ok. No complaints. No further low flow alarms.   Doppler MAP 98 (on Midodrine  15 tid)   INR 1.7 today   LVAD INTERROGATION:  HeartMate III LVAD:   Flow 3.9 liters/min, speed 5000, power 3.4 PI  4.2. No low flows. 4 PI events VAD interrogated personally. Parameters stable.     Objective:    Vital Signs:   Temp:  [98 F (36.7 C)-99.3 F (37.4 C)] 98.5 F (36.9 C) (01/26 0804) Pulse Rate:  [47-63] 60 (01/26 0700) Resp:  [16-40] 16 (01/26 0700) BP: (82-121)/(66-89) 110/88 (01/26 0700) SpO2:  [91 %-100 %] 95 % (01/26 0700) Weight:  [69.9 kg] 69.9 kg (01/26 0430) Last BM Date: 10/15/21 Mean arterial Pressure 80s   Intake/Output:   Intake/Output Summary (Last 24 hours) at 10/16/2021 0813 Last data filed at 10/16/2021 0700 Gross per 24 hour  Intake 559.21 ml  Output 100 ml  Net 459.21 ml     Physical Exam   CVP 12  General:  well appearing, sitting up in bed. NAD HEENT: normal  Neck: supple. JVD 12 cm  Carotids 2+ bilat; no bruits. No lymphadenopathy or thryomegaly appreciated. Cor: LVAD hum.  Lungs: clear bilaterally. No wheezing  Abdomen: obese soft, nontender, non-distended. No hepatosplenomegaly. No bruits or masses. Good bowel sounds. Driveline site clean. Anchor in place.  Extremities: no cyanosis, clubbing, rash. Warm and dry   Neuro: alert & oriented x 3. No focal deficits. Moves all 4 without problem    Telemetry   AV pacing 60s-70s Personally reviewed  Labs   Basic Metabolic Panel: Recent Labs  Lab 10/12/21 0447  10/13/21 0353 10/14/21 0401 10/14/21 1934 10/15/21 0356 10/16/21 0425  NA 132* 132* 136 136 132* 135  K 5.5* 5.6* 5.0 4.3 4.3 4.2  CL 97* 98 102 102 99 102  CO2 24 23 25 25 25 24   GLUCOSE 121* 109* 131* 133* 91 98  BUN 49* 60* 38* 26* 20 32*  CREATININE 3.91* 4.97* 3.68* 2.53* 2.16* 3.41*  CALCIUM 8.6* 8.5* 8.2* 8.2* 8.3* 8.8*  MG 2.6* 2.5* 2.6*  --  2.6* 2.4  PHOS 5.5* 6.9* 4.5 2.9 2.8 4.0    Liver Function Tests: Recent Labs  Lab 10/13/21 0353 10/14/21 0401 10/14/21 1934 10/15/21 0356 10/16/21 0425   ALBUMIN 1.9* 2.1* 2.0* 2.0* 2.1*   No results for input(s): LIPASE, AMYLASE in the last 168 hours. No results for input(s): AMMONIA in the last 168 hours.   CBC: Recent Labs  Lab 10/12/21 0916 10/13/21 0353 10/14/21 0401 10/15/21 0356 10/16/21 0425  WBC 15.0* 13.8* 13.2* 10.0 9.5  HGB 8.2* 7.8* 9.2* 8.9* 8.8*  HCT 26.5* 24.7* 29.4* 27.9* 27.7*  MCV 95.7 95.4 96.7 96.5 95.2  PLT 539* 599* 656* 615* 627*    INR: Recent Labs  Lab 10/12/21 0447 10/13/21 0353 10/14/21 0401 10/15/21 0356 10/16/21 0425  INR 2.4* 2.5* 2.6* 2.2* 1.7*    Other results:   Imaging   No results found.   Medications:     Scheduled Medications:  amiodarone  200 mg Oral BID   aspirin  81 mg Oral Daily   Chlorhexidine Gluconate Cloth  6 each Topical Q0600   darbepoetin (ARANESP) injection - NON-DIALYSIS  150 mcg Subcutaneous Q Mon-1800   docusate sodium  100 mg Oral BID   feeding supplement  237 mL Oral TID BM   mouth rinse  15 mL Mouth Rinse BID   midodrine  15 mg Oral TID WC   mirtazapine  7.5 mg Oral QHS   montelukast  5 mg Oral QHS   multivitamin  1 tablet Oral QHS   pantoprazole  40 mg Oral Daily   rosuvastatin  10 mg Oral Daily   senna  2 tablet Oral QHS   sodium chloride flush  10-40 mL Intracatheter Q12H   sodium chloride flush  3 mL Intravenous Q12H   sodium zirconium cyclosilicate  10 g Oral Daily   Warfarin - Pharmacist Dosing Inpatient   Does not apply q1600    Infusions:   prismasol BGK 4/2.5 500 mL/hr at 10/15/21 0103    prismasol BGK 4/2.5 300 mL/hr at 10/15/21 0455   sodium chloride Stopped (09/18/21 0803)   sodium chloride Stopped (10/02/21 0900)   milrinone 0.125 mcg/kg/min (10/16/21 0700)   prismasol BGK 4/2.5 1,500 mL/hr at 10/15/21 0417   promethazine (PHENERGAN) injection (IM or IVPB) Stopped (10/10/21 1755)    PRN Medications: sodium chloride, acetaminophen, albuterol, Gerhardt's butt cream, heparin, lip balm, ondansetron (ZOFRAN) IV, oxyCODONE,  polyethylene glycol, promethazine (PHENERGAN) injection (IM or IVPB), sodium chloride, sodium chloride flush, sodium chloride flush, traMADol   Assessment/Plan:    1. Acute on chronic systolic CHF:  Long-standing cardiomyopathy.  [place].  Echo this admission with EF < 20%, severe LV dilation, restrictive diastolic function, moderate RV dysfunction, moderate MR, mod-severe TR. Cause of cardiomyopathy is uncertain.  He has a history of CAD, but I do not think that the described CAD from the past could explain his cardiomyopathy, but CAD could have progressed.  With difficulty tolerating GDMT/need for midodrine and cardiorenal syndrome as well as profound  volume overload + NYHA class IV symptoms,  concerned for low output HF. Co-ox off milrinone was 36%, milrinone started and increased to 0.375 but CO remained low. NE added and Impella 5.5 placed 12/23. EF 10% on TEE 12/23. s/p HM III VAD on 12/28.  Ramp echo 01/03 with EF 20-25%, RV moderately reduced, septum mid-line.  Ramp echo again on 1/12 showed severely dilated/moderately dysfunctional RV with severe TR and left-shifted septum with small LV.  Speed decreased on 1/12 to 5000 rpm over 2 sessions due to RV failure and milrinone 0.25 + NE 3 restarted.  Echo repeated on 1/16, RV looks better and LV larger but still with left shift of the septum.  Able to increase CVVH, weight down significantly.  Echo again on 1/18, speed decreased to 4900 with RV mildly dysfunctional and severely enlarged with severe TR.  CVVHD stopped 1/20.  1/23 delay in iHD due to staffing, developed increased dyspnea + volume overload CVP >20 and low flow alarms. VAD speed reduced to 4800. CVVH restarted. Milrinone restarted 0.25. Volume much improved. CVVH stopped 1/25. CVP 12 today - Co-ox 48% on milrinone 0.125 but looks and feels well  - Plan to transition to iHD today  - Continue midodrine 15 mg tid.   - He does not have BP room for sildenafil.  - Warfarin for INR  2-2.5.  INR 1.7 today Discussed dosing with PharmD personally. - Continue 81 mg aspirin - Continue UNNA boots 2. Tricuspid regurgitation:  Tricuspid repair not done at time of VAD d/t proximity of ICD wires and hypotension during surgery.  He has severe TR.  3. Atrial fibrillation: Now in NSR - Continue PO amio 200 bid  4. AKI on CKD stage 3: Gradual rise (was about 1.7 pre-op) since surgery.  He has not been hypotensive post-op, suspect intra-op hypotension led to development of ATN => urine sediment looked like ATN per renal. Got CVVH. TDC placed 1/7. Now off CVVH. Plan transition to iHD today - Maintain MAP and CO as above - Appreciate Nephrology input 5. CAD: History of PCI to OM1 in 2007 and RCA in 2013.  No CP or ACs.  - Deferred cardiac cath due to AKI and plan for VAD - No s/s angina  - Continue Crestor.  6. ID: Had fever 12/21, PCT 1.03.  Blood cultures negative. ?Phlebitis at IV site. Post-op fever with elevated WBCs, PCT 2.63 -> 1.78.  He completed course of cefepime. Afebrile with stable WBCs.   7. Constipation: Resolved.  8. Hyponatremia: Hypervolemic hyponatremia.  Controlled via CVVH. 135 today 9. Anemia: 1 unit PRBCs on 1/12,  1 unit PRBs 1/13, 1 unit 1/16. No overt bleeding, ?losses via CVVH.  Hgb 8.8 today, no evidence bleeding - Transfuse for Hgb <7.5  - Received IV iron x 5 doses per nephrology (finished 1/20) 10. Confusion/delirium: Initially post-op and started on Precedex.  Resolved.  11. F/E/N: TFs via Cortrack for poor nutritional status.  Improving po intake.  - TF off. Ate well yesterday. Continue to monitor. - Prealbumin 7.8>9.5.  - Added remeron to help with appetite.  12. Left pleural effusion: - s/p thoracentesis 1/7 - s/p thoracentesis 1/12. 13. Pericardial effusion: Stable moderate pericardial effusion, primarily localized to along the lateral wall of the LV.  Does not appear to be affecting the RV/doubt tamponade.  Discussed after 1/12 and 1/18 echo with  Dr. Cyndia Bent, conservative management for now.  14. Right great toe pain: - ? Possible gout.  - Uric acid  13.3 -> 1.6 (?) 15. Hyperkalemia - Resolved w/ HD and lokelma. K ok 4.2 today    16. Deconditioning:  - will need CIR once medically ready  - continue to ambulate  Isaac Jester, PA-C  8:13 AM  Patient seen with PA, agree with the above note.    CVVH stopped yesterday, CVP 11-12.  Early am co-ox 48% on milrinone 0.125.  No low flow alarms on LVAD.    MAP stable on midodrine 15 tid.  Patient has walked already this morning, short of breath mildly at the end of his walk.     General: Well appearing this am. NAD.  HEENT: Normal. Neck: Supple, JVP 12 cm. Carotids OK.  Cardiac:  Mechanical heart sounds with LVAD hum present.  Lungs:  CTAB, normal effort.  Abdomen:  NT, ND, no HSM. No bruits or masses. +BS  LVAD exit site: Well-healed and incorporated. Dressing dry and intact. No erythema or drainage. Stabilization device present and accurately applied. Driveline dressing changed daily per sterile technique. Extremities:  Warm and dry. No cyanosis, clubbing, rash, or edema.  Neuro:  Alert & oriented x 3. Cranial nerves grossly intact. Moves all 4 extremities w/o difficulty. Affect pleasant     Will repeat co-ox, if > 55% can stop milrinone.  MAP improved, will keep midodrine at 15 mg tid with HD today, will aim to wean slowly as long as MAP tolerates after that.    He is now off CVVH, will need iHD today (nephrology arranging).    Walk today in halls.     Eating better.    If he does well with transition to [facility], can go to step down.   CRITICAL CARE Performed by: Loralie Champagne  Total critical care time: 35 minutes  Critical care time was exclusive of separately billable procedures and treating other patients.  Critical care was necessary to treat or prevent imminent or life-threatening deterioration.  Critical care was time spent personally by me on the following  activities: development of treatment plan with patient and/or surrogate as well as nursing, discussions with consultants, evaluation of patient's response to treatment, examination of patient, obtaining history from patient or surrogate, ordering and performing treatments and interventions, ordering and review of laboratory studies, ordering and review of radiographic studies, pulse oximetry and re-evaluation of patient's condition.  Loralie Champagne 10/16/2021 8:43 AM

## 2021-10-16 NOTE — Progress Notes (Signed)
[place] for Warfarin Indication: LVAD/AF  Allergies  Allergen Reactions   Mushroom Extract Complex Nausea And Vomiting   Neosporin [Neomycin-Bacitracin Zn-Polymyx] Hives    Patient Measurements: Height: 5\' 9"  (175.3 cm) Weight: 69.9 kg (154 lb 1.6 oz) IBW/kg (Calculated) : 70.7 Heparin Dosing Weight: 83.7 kg  Vital Signs: Temp: 98.5 F (36.9 C) (01/26 0804) Temp Source: Oral (01/26 0804) BP: 101/90 (01/26 0800) Pulse Rate: 60 (01/26 0700)  Labs: Recent Labs    10/14/21 0401 10/14/21 1934 10/15/21 0356 10/16/21 0425  HGB 9.2*  --  8.9* 8.8*  HCT 29.4*  --  27.9* 27.7*  PLT 656*  --  615* 627*  LABPROT 27.8*  --  24.5* 20.3*  INR 2.6*  --  2.2* 1.7*  CREATININE 3.68* 2.53* 2.16* 3.41*     Estimated Creatinine Clearance: 20.2 mL/min (A) (by C-G formula based on SCr of 3.41 mg/dL (H)).   Medical History: Past Medical History:  Diagnosis Date   Arrhythmia    atrial fibrillation   CHF (congestive heart failure) (HCC)    Chronic kidney disease    Coronary artery disease    Hyperlipidemia    Hypertension    Myocardial infarct [facility])     Assessment: 71 yo male on chronic Eliquis PTA for afib.  Now s/p LVAD placement 09/17/21, pharmacy asked to begin anticoagulation with warfarin.   Patient previously therapeutic on lower doses 1-2mg  daily prior to tube feed being started for poor nutritional intake.   INR today is subtherapeutic at 1.7, CBC and LDH stable. Pt endorses increasing appetite and has been consistently having between one and two ensures daily. No signs or symptoms of bleeding.  Goal of Therapy:  INR 2-2.5 Monitor platelets by anticoagulation protocol: Yes   Plan:  -Warfarin 2 mg PO x1 today -Daily INR, CBC, LDH   Varney Daily, PharmD PGY1 Pharmacy Resident  Please check AMION for all [facility] pharmacy phone numbers After 10:00 PM call main pharmacy [phone]

## 2021-10-16 NOTE — Progress Notes (Signed)
OT Cancellation Note  Patient Details Name: Isaac [name] MRN: [id number] DOB: 09-09-1951   Cancelled Treatment:    Reason Eval/Treat Not Completed: Patient at procedure or test/ unavailable (Dialysis. Will return as schedule allows.)  [place], OTR/L Acute Rehab Pager: [phone] Office: [phone] 10/16/2021, 2:27 PM

## 2021-10-16 NOTE — Progress Notes (Signed)
LVAD Coordinator Rounding Note:  Admitted 09/06/21 due to CHF exacerbation to Dr Claris Gladden service. He was started on Milrinone on admission. Febrile 09/10/21- suspected to have UTI- antibiotics started. Impella 5.5 placed 09/12/21.   HM III LVAD implanted on 09/17/21 by Dr Cyndia Bent under destination therapy criteria. 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   Pt sitting up in bed watching TV upon my arrival. Denies complaints this morning.   Plan for iHD today & 1/28. Creatinine 3.14 today. 100 cc urine output noted yesterday.   Remains on Milrinone 0.125 mcg/kg/min. Co-ox 47 this morning, with repeat of 52.   Removed all chest tube sutures this morning. Incisions scabbed over and healing well.   No family at bedside this morning. I attempted to contact Mrs Willinger to set up LVAD education next week, but was unable to leave voicemail as her voicemail is full.   Vital signs: Temp: 98.5 HR: 60 A paced Doppler Pressure: 98 Auto Cuff BP: 106/73 (83) O2 Sat: 97% on RA Wt: 185.2>196.8>194>189.1>192.2>198.4>189.6>188.7>183.6>177>160.2>157.6>156.3>154.3>155.4>163.8>155.4>150.7>154.1 lbs    LVAD interrogation reveals:  Speed: 5000 Flow: 3.2 Power: 3.4 w PI: 6.8 Hematocrit: 20-- DO NOT ADJUST (reminder placed on VAD cart)  Alarms: none Events: 4 PI events so far today  Fixed speed: 5000 Low speed limit: 4700  Drive Line: Existing VAD dressing removed and site care performed using sterile technique. Drive line exit site cleaned with Chlora prep applicators x 2, allowed to dry, rinsed with saline and gauze dressing with silver strip applied. Exit site not incorporated, the velour is fully implanted at exit site.  Scant amount of dried tissue fat necrosis drainage noted on silver strip. No redness, tenderness, foul odor or rash noted. Drive line anchor secure. Continue twice weekly dressing changes on Monday/Thursday  per Nurse Davonna Belling, [place] coordinator, or trained caregiver. May be able to advance to weekly changes on Monday. Next dressing change due: 10/20/21.    Labs:  LDH trend: 344>379>370>378>380....>295>270>277>264>299>261>226>257>238>229  INR trend: 1.6>1.8>2.8>2.9>2.1>2.0>2.2>2.4>2.4>2.5>2.3>1.4>1.5>2.1>2.6>2.2>1.7  WBC trend: 14.1>13.6>13.1>11>8.7>10.1>13.5>14>14.2>14.3>13.2>10.9>11.1>12.4>12.8>11.6>13.8>13.2>10>9.5  Cr trend: 2.11>2.39>5.24>3.45>2.38>1.97>1.87>1.68>1.73>1.60>1.49>1.52>1.51>1.57>1.39>1.44>1.39>4.97>3.68>2.16>3.14  Hgb trend: 6.5>7.5>8.1>8.8>7.7>8.6>7.7>7.8>8.5>8.3>8.9>8.1>7.8>9.2>8.9>8.8  Anticoagulation Plan: -INR Goal: 2.0 - 2.5 -ASA Dose: 81 mg daily   Blood Products:  Intra Op: 09/17/21  1 PLT  4 FFP   4 PRBC  Cellsaver- 803 cc Post Op:  - 09/17/21>>1 PRBC - 09/18/21>> 3 PRBCs - 09/19/21>> 1 PRBC -  09/26/21>> 1 PRBC - 10/02/21>>1 PRBC - 10/06/21 >> 1 PRBC  Device: - [employer] dual - AV paced 60 -Therapies: OFF  *Will need to be referred to device clinic at discharge*  Arrythmias: 3 episodes of VT that required defib while in OR 09/17/21. Currently on Amiodarone gtt. Remains in afib.   Respiratory: extubated 09/18/22  Nitric Oxide:  off 09/18/22  Renal:  09/23/21>>started CVVH 10/10/21>>stopped CVVH 10/13/21>> restarted CVVH 10/15/21>> stopped CVVH  Infection:  09/10/21>> blood cultures >> No growth 5 days; final 09/12/21>> urine culture >> No growth; final  09/25/21>>blood cultures>>negative  09/27/21>>pleural effusion>>negative, final 10/02/21>> pleural effusion >> no growth; final   Drips:  Milrinone 0.125 mcg/kg/min (restarted 10/13/21)  Patient Education: No family at bedside. Briefly discussed home equipment that will be provided at discharge (Charity fundraiser, MPU, batteries, etc)  Reports he is able to change power sources independently Encouraged pt to continue reading HM III patient manual. He verbalized understanding.  Will  plan for VAD discharge teaching next week with pt's wife  Plan/Recommendations:  1. [place] VAD coordinator for  drive line or equipment concerns 2. Twice a week dressing change per Nurse Davonna Belling or [place] coordinator on Monday and Thursday.  Emerson Monte RN [place] Coordinator  Office: [phone]  24/7 Pager: [phone]

## 2021-10-16 NOTE — Progress Notes (Signed)
PT Cancellation Note  Patient Details Name: Isaac [name] MRN: [id number] DOB: 21-Oct-1950   Cancelled Treatment:    Reason Eval/Treat Not Completed: Patient on iHD in room, will follow-up for PT treatment as schedule permits.  Mabeline Caras, PT, DPT Acute Rehabilitation Services  Pager [phone] Office [place] 10/16/2021, 2:29 PM

## 2021-10-16 NOTE — Progress Notes (Signed)
Nutrition Follow-up  DOCUMENTATION CODES:   Severe malnutrition in context of chronic illness  INTERVENTION:   Continue Liberalized Diet  Continue ONS-Ensure Enlive po TID, each supplement provides 350 kcal and 20 grams of protein ordered. RN may change order to pt preferred supplement  Continue Remeron  Continue Renal MVI   NUTRITION DIAGNOSIS:   Severe Malnutrition related to chronic illness (heart failure) as evidenced by severe fat depletion, severe muscle depletion.  Being addressed  GOAL:   Patient will meet greater than or equal to 90% of their needs  Progressing  MONITOR:   PO intake, Supplement acceptance, Labs, Weight trends  REASON FOR ASSESSMENT:   Consult LVAD Eval  ASSESSMENT:   71 yo male admitted with acute on chronic CHF. PMH includes CHF, CAD, CKD, HLD, HTN  12/23 Impella 5.5 placed 12/28 HM3 LVAD plaecd, Impella removed, Intubated 12/29 Extubated 12/31 FL diet 01/02 Diet advanced to 2g sodium 01/03 CRRT initiated 01/11 Calorie count indicating inadequate oral intake, Cortrak recommended 01/12 Thoracentesis with 1.3 L removed 01/13 Cortrak placed, TF initiated 01/19 Cortrak removed, TF stopped 01/20 CRRT on hold 01/23 plan for iHD but not available so back on CRRT  Plan for iHD today. Renal recovery still uncertain  Appetite remains improved; po intake 50-100%.  Noted pt drinking Boost Breeze today; pt states he likes this one. Continue oral nutrition supplements;   Potassium currently wdl; pt on lokelma daily. Phosphorus also wdl, not requiring binder therapy  Do NOT recommend any diet restrictions at this time  No new skin breakdown  Current wt 69.9 kg; lowest wt of 68.4 kg yesterday  Labs: Potassium 4.3 (wdl), phosphorus 4.0 (wdl) Meds: aranesp, colace, midodrine, remeron, Rena-vite, senna, lokelma daily  Diet Order:   Diet Order             Diet vegetarian Room service appropriate? Yes; Fluid consistency: Thin  Diet  effective now                   EDUCATION NEEDS:   Education needs have been addressed  Skin:  Skin Assessment: Skin Integrity Issues: Skin Integrity Issues:: Incisions, Stage II Stage II: sacrum-now healing per RN Incisions: new LVAD  Last BM:  1/25  Height:   Ht Readings from Last 1 Encounters:  10/09/21 5\' 9"  (1.753 m)    Weight:   Wt Readings from Last 1 Encounters:  10/16/21 69.9 kg    BMI:  Body mass index is 22.76 kg/m.  Estimated Nutritional Needs:   Kcal:  2300-2500 kcals  Protein:  135-160 g  Fluid:  1000 mL plus UOP   Kerman Passey MS, RDN, LDN, CNSC Registered Dietitian III Clinical Nutrition RD Pager and On-Call Pager Number Located in [place]

## 2021-10-16 NOTE — Progress Notes (Signed)
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 Acute component either from cardiorenal +/- ATN with decreased renal function from hemodynamics and CPB + hypotensive episodes + pressors.  - CRRT started on 1/3 - stopped mid day 1/20. Has been anuric and BUN-CRT rising-  plan was  for IHD on 1/23-  but delayed and decision was made to put back on CRRT ftom 1/23-1/25 -     then do IHD on 1/26- today  and 1/28 to keep out of trouble -  has tunneled HD cath in place.  Now has been HD dep for 3 weeks-  the longer this goes the less likely a possibility for recovery- LVAD and HD is complicated but not impossible-  have not made any OP plans as of yet-  when tolerates IHD will start to move in that direction   Cardiogenic shock: s/p LVAD 09/17/22 (HM 3).   - on milrinone and midodrine now, off NE   CAD with h/o PCI last in 2013  Anemia- iron indices 1/16 showing iron sat 12%, ferritin 477, Aranesp  60 on 10/06/21. Gave IV iron x 1g over 5 doses. Hgb falling-  have inc darbe  Afib- on amio  Acute hypoxic RF: s/p thoracentesis 1.3 L 10/02/21  Hyperkalemia:  K consistently over 5, was on lokelma BID- now daily. He's vegetarian/pescatarian and has really struggled to maintain nutrition during admission - have formally restricted diet   Bones-  [place] now just off of CRRT-  watch for binder need-  will also check PTH - pending   Dispo: ICU, ultimately will need to be able to tolerate iHD, moving in that direction now.  Should he require milrinone+ LVAD + HD this would be a concern for potential disposition.  Subjective:     No events overnight-   100 of uop recorded-  due for IHD today     Objective:   BP 111/85    Pulse 60    Temp 99.3 F (37.4 C) (Oral)    Resp (!) 29    Ht 5\' 9"  (1.753 m)    Wt 69.9 kg    SpO2 92%    BMI 22.76 kg/m   Intake/Output Summary (Last 24 hours) at 10/16/2021 0656 Last data  filed at 10/16/2021 0600 Gross per 24 hour  Intake 804.56 ml  Output 100 ml  Net 704.56 ml   Weight change: 1.5 kg  Physical Exam: GEN: NAD, sleeping HEENT: wearing glasses LUNGS: normal WOB, clear ant CV: LVAD  EXT: minimal edema, much improved, legs wrapped at tibial level ACCESS: rt IJ TC c/d/I-     Imaging: No results found.  Labs: BMET Recent Labs  Lab 10/11/21 1200 10/12/21 0447 10/13/21 0353 10/14/21 0401 10/14/21 1934 10/15/21 0356 10/16/21 0425  NA 133* 132* 132* 136 136 132* 135  K 5.3* 5.5* 5.6* 5.0 4.3 4.3 4.2  CL 98 97* 98 102 102 99 102  CO2 24 24 23 25 25 25 24   GLUCOSE 130* 121* 109* 131* 133* 91 98  BUN 40* 49* 60* 38* 26* 20 32*  CREATININE 3.03* 3.91* 4.97* 3.68* 2.53* 2.16* 3.41*  CALCIUM 8.8* 8.6* 8.5* 8.2* 8.2* 8.3* 8.8*  PHOS 4.3 5.5* 6.9* 4.5 2.9 2.8 4.0   CBC Recent Labs  Lab 10/13/21 0353 10/14/21 0401 10/15/21 0356 10/16/21 0425  WBC 13.8* 13.2* 10.0 9.5  HGB 7.8* 9.2* 8.9*  8.8*  HCT 24.7* 29.4* 27.9* 27.7*  MCV 95.4 96.7 96.5 95.2  PLT 599* 656* 615* 627*    Medications:     sodium chloride   Intravenous Once   sodium chloride   Intravenous Once   sodium chloride   Intravenous Once   sodium chloride   Intravenous Once   amiodarone  200 mg Oral BID   aspirin  81 mg Oral Daily   Chlorhexidine Gluconate Cloth  6 each Topical Q0600   darbepoetin (ARANESP) injection - NON-DIALYSIS  150 mcg Subcutaneous Q Mon-1800   docusate sodium  100 mg Oral BID   feeding supplement  237 mL Oral TID BM   mouth rinse  15 mL Mouth Rinse BID   midodrine  15 mg Oral TID WC   mirtazapine  7.5 mg Oral QHS   montelukast  5 mg Oral QHS   multivitamin  1 tablet Oral QHS   pantoprazole  40 mg Oral Daily   rosuvastatin  10 mg Oral Daily   senna  2 tablet Oral QHS   sodium chloride flush  10-40 mL Intracatheter Q12H   sodium chloride flush  3 mL Intravenous Q12H   sodium zirconium cyclosilicate  10 g Oral Daily   Warfarin - Pharmacist Dosing  Inpatient   Does not apply [facility] Pager [phone]

## 2021-10-16 NOTE — Progress Notes (Signed)
Outpatient Heart Failure CSW continuing to follow during implant hospitalization.  CSW collaborating with LVAD coordinators and no needs identified at this time- continuing to follow and assist as needed.   Jorge Ny, LCSW Clinical Social Worker [facility] Desk#: [phone] Cell#: [phone]

## 2021-10-17 ENCOUNTER — Inpatient Hospital Stay (HOSPITAL_COMMUNITY): Payer: Medicare HMO

## 2021-10-17 ENCOUNTER — Other Ambulatory Visit (HOSPITAL_COMMUNITY): Payer: Medicare HMO

## 2021-10-17 DIAGNOSIS — R570 Cardiogenic shock: Secondary | ICD-10-CM | POA: Diagnosis not present

## 2021-10-17 DIAGNOSIS — I361 Nonrheumatic tricuspid (valve) insufficiency: Secondary | ICD-10-CM | POA: Diagnosis not present

## 2021-10-17 LAB — RENAL FUNCTION PANEL
Albumin: 2 g/dL — ABNORMAL LOW (ref 3.5–5.0)
Anion gap: 8 (ref 5–15)
BUN: 19 mg/dL (ref 8–23)
CO2: 26 mmol/L (ref 22–32)
Calcium: 8.2 mg/dL — ABNORMAL LOW (ref 8.9–10.3)
Chloride: 97 mmol/L — ABNORMAL LOW (ref 98–111)
Creatinine, Ser: 2.81 mg/dL — ABNORMAL HIGH (ref 0.61–1.24)
GFR, Estimated: 24 mL/min — ABNORMAL LOW (ref >60)
Glucose, Bld: 113 mg/dL — ABNORMAL HIGH (ref 70–99)
Phosphorus: 3.5 mg/dL (ref 2.5–4.6)
Potassium: 4 mmol/L (ref 3.5–5.1)
Sodium: 131 mmol/L — ABNORMAL LOW (ref 135–145)

## 2021-10-17 LAB — CBC
HCT: 26.9 % — ABNORMAL LOW (ref 39.0–52.0)
Hemoglobin: 8.4 g/dL — ABNORMAL LOW (ref 13.0–17.0)
MCH: 29.5 pg (ref 26.0–34.0)
MCHC: 31.2 g/dL (ref 30.0–36.0)
MCV: 94.4 fL (ref 80.0–100.0)
Platelets: 617 10*3/uL — ABNORMAL HIGH (ref 150–400)
RBC: 2.85 MIL/uL — ABNORMAL LOW (ref 4.22–5.81)
RDW: 18.8 % — ABNORMAL HIGH (ref 11.5–15.5)
WBC: 9.7 10*3/uL (ref 4.0–10.5)
nRBC: 0 % (ref 0.0–0.2)

## 2021-10-17 LAB — GLUCOSE, CAPILLARY
Glucose-Capillary: 103 mg/dL — ABNORMAL HIGH (ref 70–99)
Glucose-Capillary: 105 mg/dL — ABNORMAL HIGH (ref 70–99)
Glucose-Capillary: 87 mg/dL (ref 70–99)
Glucose-Capillary: 91 mg/dL (ref 70–99)
Glucose-Capillary: 94 mg/dL (ref 70–99)
Glucose-Capillary: 99 mg/dL (ref 70–99)

## 2021-10-17 LAB — COOXEMETRY PANEL
Carboxyhemoglobin: 1.9 % — ABNORMAL HIGH (ref 0.5–1.5)
Methemoglobin: 0.9 % (ref 0.0–1.5)
O2 Saturation: 55 %
Total hemoglobin: 8.1 g/dL — ABNORMAL LOW (ref 12.0–16.0)

## 2021-10-17 LAB — LACTATE DEHYDROGENASE: LDH: 222 U/L — ABNORMAL HIGH (ref 98–192)

## 2021-10-17 LAB — ECHOCARDIOGRAM LIMITED
Height: 69 in
Weight: 2433.88 oz

## 2021-10-17 LAB — MAGNESIUM: Magnesium: 2 mg/dL (ref 1.7–2.4)

## 2021-10-17 LAB — PARATHYROID HORMONE, INTACT (NO CA): PTH: 59 pg/mL (ref 15–65)

## 2021-10-17 LAB — PROTIME-INR
INR: 1.8 — ABNORMAL HIGH (ref 0.8–1.2)
Prothrombin Time: 20.4 seconds — ABNORMAL HIGH (ref 11.4–15.2)

## 2021-10-17 LAB — PREALBUMIN: Prealbumin: 12.6 mg/dL — ABNORMAL LOW (ref 18–38)

## 2021-10-17 MED ORDER — WARFARIN SODIUM 1 MG PO TABS
1.0000 mg | ORAL_TABLET | Freq: Once | ORAL | Status: AC
  Administered 2021-10-17: 1 mg via ORAL
  Filled 2021-10-17: qty 1

## 2021-10-17 MED ORDER — CHLORHEXIDINE GLUCONATE CLOTH 2 % EX PADS
6.0000 | MEDICATED_PAD | Freq: Every day | CUTANEOUS | Status: DC
  Administered 2021-10-19: 6 via TOPICAL

## 2021-10-17 MED ORDER — MIDODRINE HCL 5 MG PO TABS
10.0000 mg | ORAL_TABLET | Freq: Three times a day (TID) | ORAL | Status: DC
  Administered 2021-10-17 – 2021-10-18 (×2): 5 mg via ORAL
  Filled 2021-10-17 (×2): qty 2

## 2021-10-17 NOTE — Progress Notes (Addendum)
Speed  Flow  PI  Power  LVIDD  AI  Aortic opening MR  TR  Septum  RV   5000 2.9 8.8 3.4 5.24 trivial 5/5 trivial Mod/severe Bow to right  Mild HK   5200  3.8 7.9 3.6 4.8 trivial 5/5 min  opening  trivial Mod/      severe midline                                                         Auto cuff BP:  108/94 (101)   Ramp ECHO performed at bedside per Dr. Aundra Dubin.  At completion of ramp study, patients primary controller programmed:  Fixed speed: 5200 Low speed limit: 4900    Zada Girt RN, VAD Coordinator 24/7 pager [phone]

## 2021-10-17 NOTE — Progress Notes (Signed)
Inpatient Rehab Admissions Coordinator:   Note pt ambulating 450' with min guard.  [employer] unlikely to approve CIR for this patient given high level of function.  Would not recommend a CIR consult at this time.   Shann Medal, PT, DPT Admissions Coordinator [phone] 10/17/21  1:47 PM

## 2021-10-17 NOTE — Progress Notes (Signed)
*  LVAD note*   LVAD briefly alarmed.  No alarms showing on monitor.  HX log viewed.  Pt lying supine sleeping.    1154:14: low flow alarm     -speed:  5200       Flow:  2.5       Power: 3.7       PI:  13.2  1154:15:  no active alarm      Speed:  5200      Flow:  2.5      Power:  3.7      PI: 13.1

## 2021-10-17 NOTE — Progress Notes (Signed)
Physical Therapy Treatment Patient Details Name: Isaac [name] MRN: [id number] DOB: Dec 04, 1950 Today's Date: 10/17/2021   History of Present Illness Pt is a 71 y.o. male admitted 09/07/21 with near-syncope, nausea, poor oral intake, generalized weakness. Workup for advanced heart failure and persistent inotropic with requirement, cardiogenic shock. Fertile 12/19. Impella placed on 12/23. LVAD placed 12/28. Pt with post-op delirium and agitation. CRRT initiated 1/3. S/p thoracentesis 1/12. S/p cortrak placement 1/13. CRRT stopped 1/20.  Pt back on CRRT on 1/23 due to staffing of HD. PMH includes advanced CHF (LVEF <20%), AICD/PPM, PAF on Eliquis, CKD 3, HTN, HLD.    PT Comments    Pt admitted with above diagnosis. Pt was able to ambulate on unit with min guard assist overall. Pt needs cues for endurance training and taking rest breaks as well as for pursed lip breathing. Progressing otherwise. Needed a few cues for manipultation of LVAD equipment. Nurse wanted pt back in bed as she said he has had a busy day.  Pt currently with functional limitations due to balance and endurance deficits. Pt will benefit from skilled PT to increase their independence and safety with mobility to allow discharge to the venue listed below.      Recommendations for follow up therapy are one component of a multi-disciplinary discharge planning process, led by the attending physician.  Recommendations may be updated based on patient status, additional functional criteria and insurance authorization.  Follow Up Recommendations  Acute inpatient rehab (3hours/day)     Assistance Recommended at Discharge Frequent or constant Supervision/Assistance  Patient can return home with the following A little help with walking and/or transfers;Help with stairs or ramp for entrance;A little help with bathing/dressing/bathroom   Equipment Recommendations  Rollator (4 wheels);BSC/3in1    Recommendations for Other Services Other  (comment) ([facility]ob[employer])     Precautions / Restrictions Precautions Precautions: Fall;Sternal;Other (comment) Precaution Comments: LVAD Restrictions Other Position/Activity Restrictions: Sternal Precautions     Mobility  Bed Mobility Overal bed mobility: Needs Assistance Bed Mobility: Supine to Sit, Sit to Supine Rolling: Min guard Sidelying to sit: Min guard Supine to sit: Min guard Sit to supine: Min guard Sit to sidelying: Min guard General bed mobility comments: Assist for safety/lines. Able to sit up without use of UE's.  Pt needed a few cues for manipulating LVAD equipment    Transfers Overall transfer level: Needs assistance Equipment used: Rolling walker (2 wheels) Transfers: Sit to/from Stand Sit to Stand: Min guard           General transfer comment: Min guard A for power up with cues for hand placement.    Ambulation/Gait Ambulation/Gait assistance: Min guard Gait Distance (Feet): 450 Feet Assistive device: Rolling walker (2 wheels) Gait Pattern/deviations: Step-through pattern, Decreased stride length Gait velocity: decr Gait velocity interpretation: 1.31 - 2.62 ft/sec, indicative of limited community ambulator   General Gait Details: Assist for safety/llines.  Took a few standing rest breaks and cued at times for pursed lip breathing.   Stairs             Wheelchair Mobility    Modified Rankin (Stroke Patients Only)       Balance Overall balance assessment: Needs assistance Sitting-balance support: No upper extremity supported, Feet supported Sitting balance-Leahy Scale: Fair     Standing balance support: No upper extremity supported, During functional activity Standing balance-Leahy Scale: Fair Standing balance comment: Stood x 3 minutes without min guard for safety/lines  Cognition Arousal/Alertness: Awake/alert Behavior During Therapy: WFL for tasks assessed/performed Overall  Cognitive Status: Impaired/Different from baseline Area of Impairment: Memory, Problem solving                     Memory: Decreased short-term memory, Decreased recall of precautions Following Commands: Follows one step commands consistently     Problem Solving: Requires verbal cues General Comments: Requiring increased time and cues throughout        Exercises General Exercises - Lower Extremity Long Arc Quad: AROM, Both, 10 reps, Seated Hip Flexion/Marching: AROM, Both, 10 reps, Seated Other Exercises Other Exercises: Repeated sit<>stands, static standing EOB, marching in place    General Comments General comments (skin integrity, edema, etc.): VSS on RA      Pertinent Vitals/Pain Pain Assessment Pain Assessment: No/denies pain    Home Living                          Prior Function            PT Goals (current goals can now be found in the care plan section) Acute Rehab PT Goals Patient Stated Goal: home PT Goal Formulation: With patient Time For Goal Achievement: 10/20/21 Potential to Achieve Goals: Fair Progress towards PT goals: Progressing toward goals    Frequency    Min 3X/week      PT Plan Current plan remains appropriate    Co-evaluation              AM-PAC PT "6 Clicks" Mobility   Outcome Measure  Help needed turning from your back to your side while in a flat bed without using bedrails?: A Little Help needed moving from lying on your back to sitting on the side of a flat bed without using bedrails?: A Little Help needed moving to and from a bed to a chair (including a wheelchair)?: A Little Help needed standing up from a chair using your arms (e.g., wheelchair or bedside chair)?: A Little Help needed to walk in hospital room?: A Little Help needed climbing 3-5 steps with a railing? : A Lot 6 Click Score: 17    End of Session Equipment Utilized During Treatment: Gait belt Activity Tolerance: Patient tolerated  treatment well Patient left: in bed;with call bell/phone within reach Nurse Communication: Mobility status PT Visit Diagnosis: Unsteadiness on feet (R26.81);Other abnormalities of gait and mobility (R26.89);Muscle weakness (generalized) (M62.81)     Time: 2297-9892 PT Time Calculation (min) (ACUTE ONLY): 19 min  Charges:  $Gait Training: 8-22 mins                     Euline Kimbler M,PT Acute [facility] [phone] (pager)    [name] 10/17/2021, 1:19 PM

## 2021-10-17 NOTE — Progress Notes (Signed)
Outpatient Heart Failure CSW continuing to follow during implant hospitalization.  CSW collaborating with LVAD coordinators and no needs identified at this time- continuing to follow and assist as needed.   Jorge Ny, LCSW Clinical Social Worker [facility] Desk#: [phone] Cell#: [phone]

## 2021-10-17 NOTE — Progress Notes (Signed)
Spoke with 2H09 wife.  States she will be at bedside Monday 1/30 at 9am for LVAD teaching.

## 2021-10-17 NOTE — Progress Notes (Signed)
Patient ID: Isaac [name], male   DOB: 22-Nov-1950, 71 y.o.   MRN: [id number] Patient ID: Isaac [name], male   DOB: 14-Mar-1951, 71 y.o.   MRN: [id number]    Advanced Heart Failure VAD Team Note  PCP-Cardiologist: None   Subjective:    12/19 RHC- RA 7, PA 39/14 (25), PCWP 11, CO 6.4 CI 3. Thermo 3.5 1.8. Lasix drip stopped after cath. 12/20 Swan removed.  Norepi 3 mcg added.  12/21 Fever. Blood CX drawn. UA + leukocytes. Started on vanc and cefepime. Diuresed with IV lasix + metolazone.  12/23 Impella 5.5 placed 12/28 HM III LVAD, ICD leads plastered to tricuspid valve with severe TR, valve not replaced and some improvement in TR with LVAD placement.  12/29 Extubated 01/03 Started CRRT 01/07  L thoracentesis and placement of [facility] 01/09 LVAD speed increased to 5600 rpm 01/10 Milrinone stopped. Amio gtt>>PO. Completed 5 day course Cefepime.  01/12 Ramp echo done: Moderate lateral pericardial effusion w/o tamponade, RV larger with severe dilation/moderate dysfunction and left shift of the septum, LV small and undershifted.  TR severe.  LVAD speed decreased to 5000 rpms at 2 different sessions.  Also had thoracentesis on left with 1.3 L off.  01/13 TFs begun 01/16 echo reviewed: EF 25-30%, LV larger than prior but septum still with left shift, RV moderately dilated and moderately dysfunctional, moderate-severe TR, mod-large localized pericardial effusion lateral to the LV.  1/18 Ramp Echo with speed decreased to 4900  1/21 CVVHD stopped 1/23 delay in iHD due to staffing, developed increased dyspnea + volume overload CVP >20 and low flow alarms. VAD speed reduced to 4800 then increased back to 5000. CVVH restarted. Milrinone restarted 0.25.  1/25 CVVH stopped 1/26 Tolerated initial iHD.   Currently on Milrinone 0.125. Co-ox 55%.  iHD yesterday, weight down 2 lbs.  Noted dyspnea at the end of his walk this morning.  CVP 7-8.   Doppler MAP 80s-90s (on Midodrine 15 tid)   INR 1.8 today,  hgb 8.4  LVAD INTERROGATION:  HeartMate III LVAD:   Flow 3.5 liters/min, speed 5000, power 3.3 PI  7.2. No low flows. Few PI events VAD interrogated personally. Parameters stable.     Objective:    Vital Signs:   Temp:  [97.2 F (36.2 C)-98.9 F (37.2 C)] 98.4 F (36.9 C) (01/27 0804) Pulse Rate:  [53-174] 60 (01/27 0600) Resp:  [10-31] 27 (01/27 0600) BP: (93-145)/(49-99) 99/78 (01/27 0600) SpO2:  [66 %-97 %] 93 % (01/27 0600) Weight:  [69 kg] 69 kg (01/27 0500) Last BM Date: 10/16/21 Mean arterial Pressure 80s-90s   Intake/Output:   Intake/Output Summary (Last 24 hours) at 10/17/2021 0905 Last data filed at 10/17/2021 0600 Gross per 24 hour  Intake 83.93 ml  Output 1350 ml  Net -1266.07 ml     Physical Exam   CVP 7-8 General: Well appearing this am. NAD.  HEENT: Normal. Neck: Supple, JVP 7-8 cm. Carotids OK.  Cardiac:  Mechanical heart sounds with LVAD hum present.  Lungs:  CTAB, normal effort.  Abdomen:  NT, ND, no HSM. No bruits or masses. +BS  LVAD exit site: Well-healed and incorporated. Dressing dry and intact. No erythema or drainage. Stabilization device present and accurately applied. Driveline dressing changed daily per sterile technique. Extremities:  Warm and dry. No cyanosis, clubbing, rash, or edema.  Neuro:  Alert & oriented x 3. Cranial nerves grossly intact. Moves all 4 extremities w/o difficulty. Affect pleasant     Telemetry  A-paced 60s Personally reviewed  Labs   Basic Metabolic Panel: Recent Labs  Lab 10/13/21 0353 10/14/21 0401 10/14/21 1934 10/15/21 0356 10/16/21 0425 10/16/21 1621 10/17/21 0435 10/17/21 0436  NA 132* 136 136 132* 135 132*  --  131*  K 5.6* 5.0 4.3 4.3 4.2 3.9  --  4.0  CL 98 102 102 99 102 94*  --  97*  CO2 23 25 25 25 24 27   --  26  GLUCOSE 109* 131* 133* 91 98 117*  --  113*  BUN 60* 38* 26* 20 32* 13  --  19  CREATININE 4.97* 3.68* 2.53* 2.16* 3.41* 2.01*  --  2.81*  CALCIUM 8.5* 8.2* 8.2* 8.3* 8.8*  8.4*  --  8.2*  MG 2.5* 2.6*  --  2.6* 2.4  --  2.0  --   PHOS 6.9* 4.5 2.9 2.8 4.0 2.6  --  3.5    Liver Function Tests: Recent Labs  Lab 10/14/21 1934 10/15/21 0356 10/16/21 0425 10/16/21 1621 10/17/21 0436  ALBUMIN 2.0* 2.0* 2.1* 2.2* 2.0*   No results for input(s): LIPASE, AMYLASE in the last 168 hours. No results for input(s): AMMONIA in the last 168 hours.   CBC: Recent Labs  Lab 10/14/21 0401 10/15/21 0356 10/16/21 0425 10/16/21 1621 10/17/21 0435  WBC 13.2* 10.0 9.5 8.6 9.7  HGB 9.2* 8.9* 8.8* 9.0* 8.4*  HCT 29.4* 27.9* 27.7* 28.7* 26.9*  MCV 96.7 96.5 95.2 93.5 94.4  PLT 656* 615* 627* 706* 617*    INR: Recent Labs  Lab 10/13/21 0353 10/14/21 0401 10/15/21 0356 10/16/21 0425 10/17/21 0435  INR 2.5* 2.6* 2.2* 1.7* 1.8*    Other results:   Imaging   No results found.   Medications:     Scheduled Medications:  amiodarone  200 mg Oral BID   aspirin  81 mg Oral Daily   Chlorhexidine Gluconate Cloth  6 each Topical Q0600   darbepoetin (ARANESP) injection - NON-DIALYSIS  150 mcg Subcutaneous Q Mon-1800   docusate sodium  100 mg Oral BID   feeding supplement  237 mL Oral TID BM   mouth rinse  15 mL Mouth Rinse BID   midodrine  15 mg Oral TID WC   mirtazapine  7.5 mg Oral QHS   montelukast  5 mg Oral QHS   multivitamin  1 tablet Oral QHS   pantoprazole  40 mg Oral Daily   rosuvastatin  10 mg Oral Daily   senna  2 tablet Oral QHS   sodium chloride flush  10-40 mL Intracatheter Q12H   sodium chloride flush  3 mL Intravenous Q12H   Warfarin - Pharmacist Dosing Inpatient   Does not apply q1600    Infusions:  sodium chloride Stopped (09/18/21 0803)   sodium chloride Stopped (10/02/21 0900)   sodium chloride     sodium chloride     promethazine (PHENERGAN) injection (IM or IVPB) Stopped (10/10/21 1755)    PRN Medications: sodium chloride, sodium chloride, sodium chloride, acetaminophen, albuterol, alteplase, Gerhardt's butt cream,  heparin, lidocaine (PF), lidocaine-prilocaine, lip balm, ondansetron (ZOFRAN) IV, oxyCODONE, pentafluoroprop-tetrafluoroeth, polyethylene glycol, promethazine (PHENERGAN) injection (IM or IVPB), sodium chloride flush, sodium chloride flush, traMADol   Assessment/Plan:    1. Acute on chronic systolic CHF:  Long-standing cardiomyopathy.  [place].  Echo this admission with EF < 20%, severe LV dilation, restrictive diastolic function, moderate RV dysfunction, moderate MR, mod-severe TR. Cause of cardiomyopathy is uncertain.  He has a history of CAD, but I  do not think that the described CAD from the past could explain his cardiomyopathy, but CAD could have progressed.  With difficulty tolerating GDMT/need for midodrine and cardiorenal syndrome as well as profound volume overload + NYHA class IV symptoms,  concerned for low output HF. Co-ox off milrinone was 36%, milrinone started and increased to 0.375 but CO remained low. NE added and Impella 5.5 placed 12/23. EF 10% on TEE 12/23. s/p HM III VAD on 12/28.  Ramp echo 01/03 with EF 20-25%, RV moderately reduced, septum mid-line.  Ramp echo again on 1/12 showed severely dilated/moderately dysfunctional RV with severe TR and left-shifted septum with small LV.  Speed decreased on 1/12 to 5000 rpm over 2 sessions due to RV failure and milrinone 0.25 + NE 3 restarted.  Echo repeated on 1/16, RV looks better and LV larger but still with left shift of the septum.  Able to increase CVVH, weight down significantly.  Echo again on 1/18, speed decreased to 4900 with RV mildly dysfunctional and severely enlarged with severe TR.  CVVHD stopped 1/20.  1/23 delay in iHD due to staffing, developed increased dyspnea + volume overload CVP >20 and low flow alarms. VAD speed reduced to 4800. CVVH restarted. Milrinone restarted 0.25. Speed increased back to 5000 rpm.  Volume much improved. CVVH stopped 1/25 and had iHD 1/26. He is now on milrinone 0.125 with CVP 7-8. Co-ox  55% today. - Stop milrinone today.  - Plan iHD again tomorrow for volume management.  - Can decrease midodrine to 10 mg tid.   - He does not have BP room for sildenafil.  - Warfarin for INR 2-2.5.  INR 1.8 today Discussed dosing with PharmD personally. - Continue 81 mg aspirin - Continue UNNA boots - Repeat echo today.  2. Tricuspid regurgitation:  Tricuspid repair not done at time of VAD d/t proximity of ICD wires and hypotension during surgery.  He has severe TR.  3. Atrial fibrillation: Now in NSR - Continue PO amio 200 bid  4. AKI on CKD stage 3: Gradual rise (was about 1.7 pre-op) since surgery.  He has not been hypotensive post-op, suspect intra-op hypotension led to development of ATN => urine sediment looked like ATN per renal. Got CVVH. TDC placed 1/7. Now off CVVH and now with iHD, tolerating so far.  - Maintain MAP and CO as above - Appreciate Nephrology input 5. CAD: History of PCI to OM1 in 2007 and RCA in 2013.  No CP or ACs.  - Deferred cardiac cath due to AKI and plan for VAD - Continue Crestor.  6. ID: Had fever 12/21, PCT 1.03.  Blood cultures negative. ?Phlebitis at IV site. Post-op fever with elevated WBCs, PCT 2.63 -> 1.78.  He completed course of cefepime. Afebrile with stable WBCs.   7. Constipation: Resolved.  8. Hyponatremia: Hypervolemic hyponatremia.  Controlled via CVVH.  9. Anemia: 1 unit PRBCs on 1/12,  1 unit PRBs 1/13, 1 unit 1/16. No overt bleeding, ?losses via CVVH.  Hgb 8.4 today, no evidence bleeding - Transfuse for Hgb <7.5  - Received IV iron x 5 doses per nephrology (finished 1/20) 10. Confusion/delirium: Initially post-op and started on Precedex.  Resolved.  11. F/E/N: TFs via [facility] for poor nutritional status, now off.  Improving po intake.  - Added remeron to help with appetite.  12. Left pleural effusion: - s/p thoracentesis 1/7 - s/p thoracentesis 1/12. 13. Pericardial effusion: Stable moderate pericardial effusion, primarily localized to  along the lateral wall of  the LV.  Does not appear to be affecting the RV/doubt tamponade.  Discussed after 1/12 and 1/18 echo with Dr. Cyndia Bent, conservative management for now.  14. Right great toe pain: - ? Possible gout.  - Uric acid 13.3 -> 1.6 (?) 15. Hyperkalemia - Resolved w/ HD and lokelma.  16. Deconditioning:  - will need CIR once medically ready  - continue to ambulate  If he does well with iHD tomorrow, will send to [facility].  Continue to mobilize.   CRITICAL CARE Performed by: Loralie Champagne  Total critical care time: 35 minutes  Critical care time was exclusive of separately billable procedures and treating other patients.  Critical care was necessary to treat or prevent imminent or life-threatening deterioration.  Critical care was time spent personally by me on the following activities: development of treatment plan with patient and/or surrogate as well as nursing, discussions with consultants, evaluation of patient's response to treatment, examination of patient, obtaining history from patient or surrogate, ordering and performing treatments and interventions, ordering and review of laboratory studies, ordering and review of radiographic studies, pulse oximetry and re-evaluation of patient's condition.  Loralie Champagne 10/17/2021 9:05 AM

## 2021-10-17 NOTE — Progress Notes (Signed)
ANTICOAGULATION CONSULT NOTE   Pharmacy Consult for Warfarin Indication: LVAD/AF  Allergies  Allergen Reactions   Mushroom Extract Complex Nausea And Vomiting   Neosporin [Neomycin-Bacitracin Zn-Polymyx] Hives    Patient Measurements: Height: 5\' 9"  (175.3 cm) Weight: 69 kg (152 lb 1.9 oz) IBW/kg (Calculated) : 70.7 Heparin Dosing Weight: 83.7 kg  Vital Signs: Temp: 98.4 F (36.9 C) (01/27 0804) Temp Source: Oral (01/27 0804) BP: 114/90 (01/27 0800) Pulse Rate: 65 (01/27 0900)  Labs: Recent Labs    10/15/21 0356 10/16/21 0425 10/16/21 1621 10/17/21 0435 10/17/21 0436  HGB 8.9* 8.8* 9.0* 8.4*  --   HCT 27.9* 27.7* 28.7* 26.9*  --   PLT 615* 627* 706* 617*  --   LABPROT 24.5* 20.3*  --  20.4*  --   INR 2.2* 1.7*  --  1.8*  --   CREATININE 2.16* 3.41* 2.01*  --  2.81*     Estimated Creatinine Clearance: 24.2 mL/min (A) (by C-G formula based on SCr of 2.81 mg/dL (H)).   Medical History: Past Medical History:  Diagnosis Date   Arrhythmia    atrial fibrillation   CHF (congestive heart failure) (HCC)    Chronic kidney disease    Coronary artery disease    Hyperlipidemia    Hypertension    Myocardial infarct [facility])     Assessment: 71 yo male on chronic Eliquis PTA for afib.  Now s/p LVAD placement 09/17/21, pharmacy asked to begin anticoagulation with warfarin.   Patient previously therapeutic on lower doses 1-2mg  daily prior to tube feed being started for poor nutritional intake.   INR today is subtherapeutic at 1.8, CBC and LDH stable. Pt endorses increasing appetite and has been consistently having between one and two ensures daily. No signs or symptoms of bleeding.  Goal of Therapy:  INR 2-2.5 Monitor platelets by anticoagulation protocol: Yes   Plan:  -Warfarin 1 mg PO x1 today -Daily INR, CBC, LDH   Varney Daily, PharmD PGY1 Pharmacy Resident  Please check AMION for all [facility] pharmacy phone numbers After 10:00 PM call main pharmacy  [phone]

## 2021-10-17 NOTE — Progress Notes (Signed)
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 Acute component either from cardiorenal +/- ATN with decreased renal function from hemodynamics and CPB + hypotensive episodes + pressors.  - CRRT started on 1/3 - stopped mid day 1/20. Has been anuric and BUN-CRT rising-  plan was  for IHD on 1/23-  but delayed and decision was made to put back on CRRT ftom 1/23-1/25 -     then  IHD on 1/26- plan for also 1/28 to keep out of trouble -  has tunneled HD cath in place.  Now has been HD dep for 3 weeks-  the longer this goes the less likely a possibility for recovery- LVAD and HD is complicated but not impossible, midodrine complicates the equation-  have not made any OP plans as of yet-  when tolerates IHD will start to move in that direction   Cardiogenic shock: s/p LVAD 09/17/22 (HM 3).   - on milrinone and midodrine now, off NE -  will UF as able with HD   CAD with h/o PCI last in 2013  Anemia- iron indices 1/16 showing iron sat 12%, ferritin 477, Aranesp  60 on 10/06/21. Gave IV iron x 1g over 5 doses. Hgb falling-  have inc darbe  Afib- on amio  Acute hypoxic RF: s/p thoracentesis 1.3 L 10/02/21  Hyperkalemia:  K was consistently over 5, was on lokelma BID- then daily. He's vegetarian/pescatarian and has really struggled to maintain nutrition during admission - have formally restricted diet.  K as low as in the 3's -  holding lokelma for now  [place] for binder need-  will also check PTH - pending   Dispo: ICU, ultimately will need to be able to tolerate iHD, moving in that direction now.  Should he require milrinone+ LVAD + HD this would be a concern for potential disposition.  Subjective:    Had HD yest- removed 1300 -  tolerated well -  50 of UOP recorded -- says is SOB this AM     Objective:   BP 99/78    Pulse 60    Temp 98.4 F (36.9 C) (Oral)    Resp (!) 27    Ht 5\' 9"  (1.753  m)    Wt 69 kg    SpO2 93%    BMI 22.46 kg/m   Intake/Output Summary (Last 24 hours) at 10/17/2021 0813 Last data filed at 10/17/2021 0600 Gross per 24 hour  Intake 83.93 ml  Output 1350 ml  Net -1266.07 ml   Weight change: -0.9 kg  Physical Exam: GEN: NAD, alert, pleasant  HEENT: wearing glasses LUNGS: normal WOB, clear ant CV: LVAD  EXT: minimal edema, much improved, legs wrapped at tibial level ACCESS: rt IJ TC c/d/I-     Imaging: No results found.  Labs: BMET Recent Labs  Lab 10/13/21 0353 10/14/21 0401 10/14/21 1934 10/15/21 0356 10/16/21 0425 10/16/21 1621 10/17/21 0436  NA 132* 136 136 132* 135 132* 131*  K 5.6* 5.0 4.3 4.3 4.2 3.9 4.0  CL 98 102 102 99 102 94* 97*  CO2 23 25 25 25 24 27 26   GLUCOSE 109* 131* 133* 91 98 117* 113*  BUN 60* 38* 26* 20 32* 13 19  CREATININE 4.97* 3.68* 2.53* 2.16* 3.41* 2.01* 2.81*  CALCIUM 8.5* 8.2* 8.2* 8.3* 8.8* 8.4* 8.2*  PHOS 6.9* 4.5 2.9  2.8 4.0 2.6 3.5   CBC Recent Labs  Lab 10/15/21 0356 10/16/21 0425 10/16/21 1621 10/17/21 0435  WBC 10.0 9.5 8.6 9.7  HGB 8.9* 8.8* 9.0* 8.4*  HCT 27.9* 27.7* 28.7* 26.9*  MCV 96.5 95.2 93.5 94.4  PLT 615* 627* 706* 617*    Medications:     amiodarone  200 mg Oral BID   aspirin  81 mg Oral Daily   Chlorhexidine Gluconate Cloth  6 each Topical Q0600   darbepoetin (ARANESP) injection - NON-DIALYSIS  150 mcg Subcutaneous Q Mon-1800   docusate sodium  100 mg Oral BID   feeding supplement  237 mL Oral TID BM   mouth rinse  15 mL Mouth Rinse BID   midodrine  15 mg Oral TID WC   mirtazapine  7.5 mg Oral QHS   montelukast  5 mg Oral QHS   multivitamin  1 tablet Oral QHS   pantoprazole  40 mg Oral Daily   rosuvastatin  10 mg Oral Daily   senna  2 tablet Oral QHS   sodium chloride flush  10-40 mL Intracatheter Q12H   sodium chloride flush  3 mL Intravenous Q12H   Warfarin - Pharmacist Dosing Inpatient   Does not apply [facility] Pager [phone]

## 2021-10-17 NOTE — Progress Notes (Signed)
Occupational Therapy Treatment Patient Details Name: Isaac [name] MRN: [id number] DOB: 1951-06-30 Today's Date: 10/17/2021   History of present illness Pt is a 71 y.o. male admitted 09/07/21 with near-syncope, nausea, poor oral intake, generalized weakness. Workup for advanced heart failure and persistent inotropic with requirement, cardiogenic shock. [place] 12/19. Impella placed on 12/23. LVAD placed 12/28. Pt with post-op delirium and agitation. CRRT initiated 1/3. S/p thoracentesis 1/12. S/p cortrak placement 1/13. CRRT stopped 1/20.  Pt back on CRRT on 1/23 due to staffing of HD. PMH includes advanced CHF (LVEF <20%), AICD/PPM, PAF on Eliquis, CKD 3, HTN, HLD.   OT comments  Pt awakened for therapy. Immediately agreeable to ADL training. Pt completed grooming and UB bathing and dressing with up to min assist and then too fatigued to continue with LB ADL. Stood and took steps along EOB with min guard assist to reposition hips. Pt with decreased attention to controller with bed mobility. Wife arrived at end of session with veggie burger. Pt continues to be appropriate for inpatient rehab. He demonstrates poor endurance, generalized weakness and impaired cognition.    Recommendations for follow up therapy are one component of a multi-disciplinary discharge planning process, led by the attending physician.  Recommendations may be updated based on patient status, additional functional criteria and insurance authorization.    Follow Up Recommendations  Acute inpatient rehab (3hours/day)    Assistance Recommended at Discharge Frequent or constant Supervision/Assistance  Patient can return home with the following  A little help with walking and/or transfers;A little help with bathing/dressing/bathroom;Direct supervision/assist for medications management;Direct supervision/assist for financial management;Assistance with cooking/housework   Equipment Recommendations  BSC/3in1    Recommendations  for Other Services      Precautions / Restrictions Precautions Precautions: Fall;Sternal;Other (comment) (LVAD) Precaution Comments: LVAD Restrictions Other Position/Activity Restrictions: Sternal Precautions       Mobility Bed Mobility Overal bed mobility: Needs Assistance Bed Mobility: Supine to Sit, Sit to Supine     Supine to sit: Supervision Sit to supine: Supervision   General bed mobility comments: good adherence to sternal precautions, cues to monitor lines    Transfers Overall transfer level: Needs assistance Equipment used: Rolling walker (2 wheels) Transfers: Sit to/from Stand Sit to Stand: Min guard           General transfer comment: stood and took steps to [facility] to reposition hips prior to return to supine     Balance Overall balance assessment: Needs assistance   Sitting balance-Leahy Scale: Good       Standing balance-Leahy Scale: Fair                             ADL either performed or assessed with clinical judgement   ADL Overall ADL's : Needs assistance/impaired Eating/Feeding: Set up;Bed level   Grooming: Wash/dry face;Oral care;Sitting;Supervision/safety   Upper Body Bathing: Minimal assistance;Sitting       Upper Body Dressing : Minimal assistance;Sitting                     General ADL Comments: Pt switching to batteries with supervision, decreased attention to controller with bed mobility.    Extremity/Trunk Assessment              Vision       Perception     Praxis      Cognition Arousal/Alertness: Awake/alert Behavior During Therapy: Flat affect Overall Cognitive Status: Impaired/Different from baseline Area of  Impairment: Memory, Problem solving                     Memory: Decreased short-term memory       Problem Solving: Requires verbal cues General Comments: pt agreed to ADL training and then forgot what the plan was for the session as OT prepared with linens         Exercises      Shoulder Instructions       General Comments VSS on RA    Pertinent Vitals/ Pain       Pain Assessment Pain Assessment: No/denies pain  Home Living                                          Prior Functioning/Environment              Frequency  Min 2X/week        Progress Toward Goals  OT Goals(current goals can now be found in the care plan section)  Progress towards OT goals: Progressing toward goals  Acute Rehab OT Goals OT Goal Formulation: With patient Time For Goal Achievement: 10/21/21 Potential to Achieve Goals: Good  Plan Discharge plan remains appropriate    Co-evaluation                 AM-PAC OT "6 Clicks" Daily Activity     Outcome Measure   Help from another person eating meals?: None Help from another person taking care of personal grooming?: A Little Help from another person toileting, which includes using toliet, bedpan, or urinal?: A Little Help from another person bathing (including washing, rinsing, drying)?: A Little Help from another person to put on and taking off regular upper body clothing?: A Little Help from another person to put on and taking off regular lower body clothing?: A Little 6 Click Score: 19    End of Session Equipment Utilized During Treatment: Rolling walker (2 wheels)  OT Visit Diagnosis: Unsteadiness on feet (R26.81);Muscle weakness (generalized) (M62.81);Other symptoms and signs involving cognitive function;Other (comment);Pain   Activity Tolerance Patient limited by fatigue   Patient Left in bed;with call bell/phone within reach;with family/visitor present   Nurse Communication Mobility status        Time: 8341-9622 OT Time Calculation (min): 27 min  Charges: OT General Charges $OT Visit: 1 Visit OT Treatments $Self Care/Home Management : 23-37 mins  Nestor Lewandowsky, OTR/L Acute Rehabilitation Services Pager: [phone] Office: [phone]  [name] 10/17/2021, 3:36 PM

## 2021-10-17 NOTE — Progress Notes (Signed)
LVAD Coordinator Rounding Note:  Admitted 09/06/21 due to CHF exacerbation to Dr Claris Gladden service. He was started on Milrinone on admission. Febrile 09/10/21- suspected to have UTI- antibiotics started. Impella 5.5 placed 09/12/21.   HM III LVAD implanted on 09/17/21 by Dr Cyndia Bent under destination therapy criteria. 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   Pt sitting up in bed watching TV upon my arrival. Denies complaints this morning.   HD performed yesterday with one brief Low Flow noted during treatment. Plan for HD tomorrow.  No family at bedside this morning. VAD Coordinator has been unable to contact Mrs Rosenbloom to set up LVAD education next week or leave voicemail as her voicemail is full. I asked patient and BS nurse, Anderson Malta, to get message to Mrs. Breeden to be here Monday, 10/23/21 between 09:00 - 10:00 am if they speak with her. Both verbalized agreement to same.   Milrinone off this am with Ramp echo to follow per Dr. Aundra Dubin.   Vital signs: Temp: 97.5 HR: 60 paced Doppler Pressure: 84 Auto Cuff BP: 114/90 (99) O2 Sat: 95% on RA Wt: 185.2>196.8...........154.3>155.4>163.8>155.4>150.7>154.1>152 lbs    LVAD interrogation reveals:  Speed: 5000 Flow: 4.0 Power: 3.4w PI: 4.6 Hematocrit: 20-- DO NOT ADJUST  Alarms: 1 Low Flow during HD yesterday Events: 18 PI events 10/16/21  Fixed speed: 5000 Low speed limit: 4700  Drive Line: Existing VAD dressing C/D/I with anchor intact and accurately applied. Continue twice weekly dressing changes on Monday/Thursday per Nurse Davonna Belling, [place] coordinator, or trained caregiver. May be able to advance to weekly changes on Monday. Next dressing change due: 10/20/21.    Labs:  LDH trend: 344>379.............664>403>474>259>563  INR trend: 1.6>1.8>2.8>2.9>2.1.................2.6>2.2>1.7>1.8  Anticoagulation Plan: -INR Goal: 2.0 - 2.5 -ASA Dose: 81 mg daily    Blood Products:  Intra Op: 09/17/21  1 PLT  4 FFP   4 PRBC  Cellsaver- 803 cc Post Op:  - 09/17/21>>1 PRBC - 09/18/21>> 3 PRBCs - 09/19/21>> 1 PRBC -  09/26/21>> 1 PRBC - 10/02/21>>1 PRBC - 10/03/21>>1 PRBC - 10/06/21 >> 1 PRBC  Device: - [employer] dual - AV paced 60 -Therapies: OFF  *Will need to be referred to device clinic at discharge*  Arrythmias: 3 episodes of VT that required defib while in OR 09/17/21. Currently on Amiodarone gtt. Remains in afib.   Respiratory: extubated 09/18/22  Nitric Oxide:  off 09/18/22  Renal:  09/23/21>>started CVVH 10/10/21>>stopped CVVH 10/13/21>> restarted CVVH 10/15/21>> stopped CVVH  10/16/21>>started HD (hemodialysis)  Infection:  09/10/21>> blood cultures >> No growth 5 days; final 09/12/21>> urine culture >> No growth; final  09/25/21>>blood cultures>>negative  09/27/21>>pleural effusion>>negative, final 10/02/21>> pleural effusion >> no growth; final   Drips:  Milrinone 0.125 mcg/kg/min - off 10/17/21  Patient Education: No family at bedside. Reports he is able to change power sources independently Encouraged pt to continue reading HM III patient manual. He verbalized understanding.  Will plan for VAD discharge teaching next week with pt's wife  Plan/Recommendations:  1. Page VAD coordinator for drive line or equipment concerns 2. Twice a week dressing change per Nurse Davonna Belling or [place] coordinator on Monday and Thursday.  Zada Girt RN [place] Coordinator  Office: [phone]  24/7 Pager: [phone]

## 2021-10-17 NOTE — Progress Notes (Addendum)
Dr Aundra Dubin notified of MAP 90.  Order received to give midodrine 5mg , if MAP remains elevated at next dose at 1600 hold 1700 midodrine dose ordered.

## 2021-10-17 NOTE — Plan of Care (Signed)
°  Problem: Health Behavior/Discharge Planning: Goal: Ability to manage health-related needs will improve Outcome: Progressing   Problem: Clinical Measurements: Goal: Ability to maintain clinical measurements within normal limits will improve Outcome: Progressing Goal: Will remain free from infection Outcome: Progressing Goal: Diagnostic test results will improve Outcome: Progressing Goal: Respiratory complications will improve Outcome: Progressing Goal: Cardiovascular complication will be avoided Outcome: Progressing   Problem: Activity: Goal: Risk for activity intolerance will decrease Outcome: Progressing   Problem: Nutrition: Goal: Adequate nutrition will be maintained Outcome: Progressing   Problem: Coping: Goal: Level of anxiety will decrease Outcome: Progressing   Problem: Elimination: Goal: Will not experience complications related to bowel motility Outcome: Progressing Goal: Will not experience complications related to urinary retention Outcome: Progressing   Problem: Pain Managment: Goal: General experience of comfort will improve Outcome: Progressing   Problem: Safety: Goal: Ability to remain free from injury will improve Outcome: Progressing   Problem: Skin Integrity: Goal: Risk for impaired skin integrity will decrease Outcome: Progressing   Problem: Education: Goal: Ability to demonstrate management of disease process will improve Outcome: Progressing Goal: Ability to verbalize understanding of medication therapies will improve Outcome: Progressing Goal: Individualized Educational Video(s) Outcome: Progressing   Problem: Activity: Goal: Capacity to carry out activities will improve Outcome: Progressing   Problem: Cardiac: Goal: Ability to achieve and maintain adequate cardiopulmonary perfusion will improve Outcome: Progressing   Problem: Education: Goal: Knowledge of the prescribed therapeutic regimen will improve Outcome: Progressing    Problem: Activity: Goal: Risk for activity intolerance will decrease Outcome: Progressing   Problem: Cardiac: Goal: Ability to maintain an adequate cardiac output will improve Outcome: Progressing   Problem: Coping: Goal: Level of anxiety will decrease Outcome: Progressing   Problem: Fluid Volume: Goal: Risk for excess fluid volume will decrease Outcome: Progressing   Problem: Clinical Measurements: Goal: Ability to maintain clinical measurements within normal limits will improve Outcome: Progressing Goal: Will remain free from infection Outcome: Progressing   Problem: Respiratory: Goal: Will regain and/or maintain adequate ventilation Outcome: Progressing   Problem: Education: Goal: Knowledge of cardiac device and self-care will improve Outcome: Progressing Goal: Ability to safely manage health related needs after discharge will improve Outcome: Progressing Goal: Individualized Educational Video(s) Outcome: Progressing   Problem: Cardiac: Goal: Ability to achieve and maintain adequate cardiopulmonary perfusion will improve Outcome: Progressing

## 2021-10-18 DIAGNOSIS — Z95811 Presence of heart assist device: Secondary | ICD-10-CM | POA: Diagnosis not present

## 2021-10-18 DIAGNOSIS — R570 Cardiogenic shock: Secondary | ICD-10-CM | POA: Diagnosis not present

## 2021-10-18 LAB — RENAL FUNCTION PANEL
Albumin: 2 g/dL — ABNORMAL LOW (ref 3.5–5.0)
Anion gap: 13 (ref 5–15)
BUN: 31 mg/dL — ABNORMAL HIGH (ref 8–23)
CO2: 24 mmol/L (ref 22–32)
Calcium: 8.6 mg/dL — ABNORMAL LOW (ref 8.9–10.3)
Chloride: 99 mmol/L (ref 98–111)
Creatinine, Ser: 3.93 mg/dL — ABNORMAL HIGH (ref 0.61–1.24)
GFR, Estimated: 16 mL/min — ABNORMAL LOW (ref >60)
Glucose, Bld: 87 mg/dL (ref 70–99)
Phosphorus: 4.9 mg/dL — ABNORMAL HIGH (ref 2.5–4.6)
Potassium: 4.5 mmol/L (ref 3.5–5.1)
Sodium: 136 mmol/L (ref 135–145)

## 2021-10-18 LAB — COOXEMETRY PANEL
Carboxyhemoglobin: 1.8 % — ABNORMAL HIGH (ref 0.5–1.5)
Methemoglobin: 0.8 % (ref 0.0–1.5)
O2 Saturation: 54.2 %
Total hemoglobin: 8.7 g/dL — ABNORMAL LOW (ref 12.0–16.0)

## 2021-10-18 LAB — CBC
HCT: 28.2 % — ABNORMAL LOW (ref 39.0–52.0)
Hemoglobin: 8.8 g/dL — ABNORMAL LOW (ref 13.0–17.0)
MCH: 29.4 pg (ref 26.0–34.0)
MCHC: 31.2 g/dL (ref 30.0–36.0)
MCV: 94.3 fL (ref 80.0–100.0)
Platelets: 604 10*3/uL — ABNORMAL HIGH (ref 150–400)
RBC: 2.99 MIL/uL — ABNORMAL LOW (ref 4.22–5.81)
RDW: 18.7 % — ABNORMAL HIGH (ref 11.5–15.5)
WBC: 10.4 10*3/uL (ref 4.0–10.5)
nRBC: 0 % (ref 0.0–0.2)

## 2021-10-18 LAB — GLUCOSE, CAPILLARY
Glucose-Capillary: 104 mg/dL — ABNORMAL HIGH (ref 70–99)
Glucose-Capillary: 74 mg/dL (ref 70–99)
Glucose-Capillary: 82 mg/dL (ref 70–99)
Glucose-Capillary: 88 mg/dL (ref 70–99)
Glucose-Capillary: 91 mg/dL (ref 70–99)
Glucose-Capillary: 93 mg/dL (ref 70–99)
Glucose-Capillary: 94 mg/dL (ref 70–99)

## 2021-10-18 LAB — HEPATITIS B SURFACE ANTIBODY,QUALITATIVE: Hep B S Ab: REACTIVE — AB

## 2021-10-18 LAB — LACTATE DEHYDROGENASE: LDH: 196 U/L — ABNORMAL HIGH (ref 98–192)

## 2021-10-18 LAB — PROTIME-INR
INR: 1.7 — ABNORMAL HIGH (ref 0.8–1.2)
Prothrombin Time: 19.9 seconds — ABNORMAL HIGH (ref 11.4–15.2)

## 2021-10-18 LAB — HEPATITIS B SURFACE ANTIGEN: Hepatitis B Surface Ag: NONREACTIVE

## 2021-10-18 LAB — MAGNESIUM: Magnesium: 2.1 mg/dL (ref 1.7–2.4)

## 2021-10-18 MED ORDER — HEPARIN SODIUM (PORCINE) 1000 UNIT/ML DIALYSIS
20.0000 [IU]/kg | INTRAMUSCULAR | Status: DC | PRN
  Administered 2021-10-18: 09:00:00 1400 [IU] via INTRAVENOUS_CENTRAL
  Filled 2021-10-18 (×3): qty 2

## 2021-10-18 MED ORDER — WARFARIN SODIUM 2 MG PO TABS
2.0000 mg | ORAL_TABLET | Freq: Once | ORAL | Status: AC
Start: 2021-10-18 — End: 2021-10-18
  Administered 2021-10-18: 2 mg via ORAL
  Filled 2021-10-18: qty 1

## 2021-10-18 MED ORDER — MIDODRINE HCL 5 MG PO TABS
5.0000 mg | ORAL_TABLET | Freq: Three times a day (TID) | ORAL | Status: DC
  Administered 2021-10-18 (×2): 5 mg via ORAL
  Filled 2021-10-18 (×4): qty 1

## 2021-10-18 NOTE — Progress Notes (Signed)
31 Days Post-Op Procedure(s) (LRB): INSERTION OF IMPLANTABLE LEFT VENTRICULAR ASSIST DEVICE AND INSERTION OF FEMORAL ARTERIAL LINE (N/A) TRANSESOPHAGEAL ECHOCARDIOGRAM (TEE) (N/A) APPLICATION OF CELL SAVER (N/A) Subjective: Comfortable on HD Milrinone off VAD flow 3.7  Objective: Vital signs in last 24 hours: Temp:  [97.8 F (36.6 C)-98.7 F (37.1 C)] 98.2 F (36.8 C) (01/28 0845) Pulse Rate:  [54-71] 71 (01/27 2000) Cardiac Rhythm: A-V Sequential paced (01/28 0850) Resp:  [5-38] 14 (01/28 1015) BP: (86-149)/(61-129) 86/65 (01/28 1015) SpO2:  [92 %-100 %] 100 % (01/28 0845) Weight:  [69.2 kg] 69.2 kg (01/28 0845)  Hemodynamic parameters for last 24 hours: CVP:  [0 mmHg-12 mmHg] 6 mmHg  Intake/Output from previous day: 01/27 0701 - 01/28 0700 In: 728.7 [P.O.:720; I.V.:8.7] Out: 0  Intake/Output this shift: No intake/output data recorded. Normal VAD hum Incisions clean  Lab Results: Recent Labs    10/17/21 0435 10/18/21 0440  WBC 9.7 10.4  HGB 8.4* 8.8*  HCT 26.9* 28.2*  PLT 617* 604*   BMET:  Recent Labs    10/17/21 0436 10/18/21 0440  NA 131* 136  K 4.0 4.5  CL 97* 99  CO2 26 24  GLUCOSE 113* 87  BUN 19 31*  CREATININE 2.81* 3.93*  CALCIUM 8.2* 8.6*    PT/INR:  Recent Labs    10/18/21 0440  LABPROT 19.9*  INR 1.7*   ABG    Component Value Date/Time   PHART 7.532 (H) 09/25/2021 0340   HCO3 27.8 09/25/2021 0340   TCO2 29 09/25/2021 0340   ACIDBASEDEF 2.0 09/23/2021 0355   O2SAT 54.2 10/18/2021 0440   CBG (last 3)  Recent Labs    10/18/21 0012 10/18/21 0335 10/18/21 0822  GLUCAP 94 88 104*    Assessment/Plan: S/P Procedure(s) (LRB): INSERTION OF IMPLANTABLE LEFT VENTRICULAR ASSIST DEVICE AND INSERTION OF FEMORAL ARTERIAL LINE (N/A) TRANSESOPHAGEAL ECHOCARDIOGRAM (TEE) (N/A) APPLICATION OF CELL SAVER (N/A) Cont current care   LOS: 42 days    Isaac [name] 10/18/2021

## 2021-10-18 NOTE — Plan of Care (Signed)
°  Problem: Health Behavior/Discharge Planning: Goal: Ability to manage health-related needs will improve Outcome: Progressing   Problem: Clinical Measurements: Goal: Ability to maintain clinical measurements within normal limits will improve Outcome: Progressing Goal: Will remain free from infection Outcome: Progressing Goal: Diagnostic test results will improve Outcome: Progressing Goal: Respiratory complications will improve Outcome: Progressing Goal: Cardiovascular complication will be avoided Outcome: Progressing   Problem: Activity: Goal: Risk for activity intolerance will decrease Outcome: Progressing   Problem: Nutrition: Goal: Adequate nutrition will be maintained Outcome: Progressing   Problem: Coping: Goal: Level of anxiety will decrease Outcome: Progressing   Problem: Elimination: Goal: Will not experience complications related to bowel motility Outcome: Progressing Goal: Will not experience complications related to urinary retention Outcome: Progressing   Problem: Pain Managment: Goal: General experience of comfort will improve Outcome: Progressing   Problem: Safety: Goal: Ability to remain free from injury will improve Outcome: Progressing   Problem: Skin Integrity: Goal: Risk for impaired skin integrity will decrease Outcome: Progressing   Problem: Education: Goal: Ability to demonstrate management of disease process will improve Outcome: Progressing Goal: Ability to verbalize understanding of medication therapies will improve Outcome: Progressing Goal: Individualized Educational Video(s) Outcome: Progressing   Problem: Activity: Goal: Capacity to carry out activities will improve Outcome: Progressing   Problem: Cardiac: Goal: Ability to achieve and maintain adequate cardiopulmonary perfusion will improve Outcome: Progressing   Problem: Education: Goal: Knowledge of the prescribed therapeutic regimen will improve Outcome: Progressing    Problem: Activity: Goal: Risk for activity intolerance will decrease Outcome: Progressing   Problem: Cardiac: Goal: Ability to maintain an adequate cardiac output will improve Outcome: Progressing   Problem: Coping: Goal: Level of anxiety will decrease Outcome: Progressing   Problem: Fluid Volume: Goal: Risk for excess fluid volume will decrease Outcome: Progressing   Problem: Clinical Measurements: Goal: Ability to maintain clinical measurements within normal limits will improve Outcome: Progressing Goal: Will remain free from infection Outcome: Progressing   Problem: Respiratory: Goal: Will regain and/or maintain adequate ventilation Outcome: Progressing   Problem: Education: Goal: Knowledge of cardiac device and self-care will improve Outcome: Progressing Goal: Ability to safely manage health related needs after discharge will improve Outcome: Progressing Goal: Individualized Educational Video(s) Outcome: Progressing   Problem: Cardiac: Goal: Ability to achieve and maintain adequate cardiopulmonary perfusion will improve Outcome: Progressing

## 2021-10-18 NOTE — Progress Notes (Addendum)
Patient ID: Isaac [name], male   DOB: [birth year], 71 y.o.   MRN: [id number]    Advanced Heart Failure VAD Team Note  PCP-Cardiologist: None   Subjective:    12/19 RHC- RA 7, PA 39/14 (25), PCWP 11, CO 6.4 CI 3. Thermo 3.5 1.8. Lasix drip stopped after cath. 12/20 Swan removed.  Norepi 3 mcg added.  12/21 Fever. Blood CX drawn. UA + leukocytes. Started on vanc and cefepime. Diuresed with IV lasix + metolazone.  12/23 Impella 5.5 placed 12/28 HM III LVAD, ICD leads plastered to tricuspid valve with severe TR, valve not replaced and some improvement in TR with LVAD placement.  12/29 Extubated 01/03 Started CRRT 01/07  L thoracentesis and placement of [facility] 01/09 LVAD speed increased to 5600 rpm 01/10 Milrinone stopped. Amio gtt>>PO. Completed 5 day course Cefepime.  01/12 Ramp echo done: Moderate lateral pericardial effusion w/o tamponade, RV larger with severe dilation/moderate dysfunction and left shift of the septum, LV small and undershifted.  TR severe.  LVAD speed decreased to 5000 rpms at 2 different sessions.  Also had thoracentesis on left with 1.3 L off.  01/13 TFs begun 01/16 echo reviewed: EF 25-30%, LV larger than prior but septum still with left shift, RV moderately dilated and moderately dysfunctional, moderate-severe TR, mod-large localized pericardial effusion lateral to the LV.  1/18 Ramp Echo with speed decreased to 4900  1/21 CVVHD stopped 1/23 delay in iHD due to staffing, developed increased dyspnea + volume overload CVP >20 and low flow alarms. VAD speed reduced to 4800 then increased back to 5000. CVVH restarted. Milrinone restarted 0.25.  1/25 CVVH stopped 1/26 Tolerated initial iHD.  1/27 Echo done, speed increased to 5200 rpm. Stopped milrinone.   Co-ox 54% off milrinone. CVP 8 this morning.  MAP 90s on midodrine 10 tid.  No UOP recorded.   INR 1.7 today, hgb 8.8  LVAD INTERROGATION:  HeartMate III LVAD:   Flow 3.8 liters/min, speed 5200, power 3.6 PI   6.5. 3 PI events overnight. VAD interrogated personally. Parameters stable.     Objective:    Vital Signs:   Temp:  [97.8 F (36.6 C)-98.7 F (37.1 C)] 98.7 F (37.1 C) (01/28 0300) Pulse Rate:  [54-71] 71 (01/27 2000) Resp:  [14-38] 18 (01/28 0600) BP: (92-149)/(63-129) 103/81 (01/28 0600) SpO2:  [92 %-97 %] 96 % (01/27 2000) Weight:  [69.2 kg] 69.2 kg (01/28 0500) Last BM Date: 10/16/21 Mean arterial Pressure 90s   Intake/Output:   Intake/Output Summary (Last 24 hours) at 10/18/2021 0756 Last data filed at 10/17/2021 1600 Gross per 24 hour  Intake 728.68 ml  Output 0 ml  Net 728.68 ml     Physical Exam   CVP 8 General: Well appearing this am. NAD.  HEENT: Normal. Neck: Supple, JVP 7-8 cm. Carotids OK.  Cardiac:  Mechanical heart sounds with LVAD hum present.  Lungs:  CTAB, normal effort.  Abdomen:  NT, ND, no HSM. No bruits or masses. +BS  LVAD exit site: Well-healed and incorporated. Dressing dry and intact. No erythema or drainage. Stabilization device present and accurately applied. Driveline dressing changed daily per sterile technique. Extremities:  Warm and dry. No cyanosis, clubbing, rash, or edema.  Neuro:  Alert & oriented x 3. Cranial nerves grossly intact. Moves all 4 extremities w/o difficulty. Affect pleasant     Telemetry   A-paced 60s Personally reviewed  Labs   Basic Metabolic Panel: Recent Labs  Lab 10/14/21 0401 10/14/21 1934 10/15/21 0356 10/16/21 0425  10/16/21 1621 10/17/21 0435 10/17/21 0436 10/18/21 0440  NA 136   < > 132* 135 132*  --  131* 136  K 5.0   < > 4.3 4.2 3.9  --  4.0 4.5  CL 102   < > 99 102 94*  --  97* 99  CO2 25   < > 25 24 27   --  26 24  GLUCOSE 131*   < > 91 98 117*  --  113* 87  BUN 38*   < > 20 32* 13  --  19 31*  CREATININE 3.68*   < > 2.16* 3.41* 2.01*  --  2.81* 3.93*  CALCIUM 8.2*   < > 8.3* 8.8* 8.4*  --  8.2* 8.6*  MG 2.6*  --  2.6* 2.4  --  2.0  --  2.1  PHOS 4.5   < > 2.8 4.0 2.6  --  3.5 4.9*   < >  = values in this interval not displayed.    Liver Function Tests: Recent Labs  Lab 10/15/21 0356 10/16/21 0425 10/16/21 1621 10/17/21 0436 10/18/21 0440  ALBUMIN 2.0* 2.1* 2.2* 2.0* 2.0*   No results for input(s): LIPASE, AMYLASE in the last 168 hours. No results for input(s): AMMONIA in the last 168 hours.   CBC: Recent Labs  Lab 10/15/21 0356 10/16/21 0425 10/16/21 1621 10/17/21 0435 10/18/21 0440  WBC 10.0 9.5 8.6 9.7 10.4  HGB 8.9* 8.8* 9.0* 8.4* 8.8*  HCT 27.9* 27.7* 28.7* 26.9* 28.2*  MCV 96.5 95.2 93.5 94.4 94.3  PLT 615* 627* 706* 617* 604*    INR: Recent Labs  Lab 10/14/21 0401 10/15/21 0356 10/16/21 0425 10/17/21 0435 10/18/21 0440  INR 2.6* 2.2* 1.7* 1.8* 1.7*    Other results:   Imaging   ECHOCARDIOGRAM LIMITED  Result Date: 10/17/2021    ECHOCARDIOGRAM LIMITED REPORT   Patient Name:   Isaac [name] Date of Exam: 10/17/2021 Medical Rec #:  [id number]           Height:       69.0 in Accession #:    [id number]          Weight:       152.1 lb Date of Birth:  Jul 18, 1951            BSA:          1.839 m Patient Age:    71 years            BP:           0/0 mmHg Patient Gender: M                   HR:           60 bpm. Exam Location:  Inpatient Procedure: Limited Echo, Color Doppler and Cardiac Doppler Indications:     Ramp study  History:         Patient has prior history of Echocardiogram examinations, most                  recent 10/08/2021. CHF, CAD, Arrythmias:Atrial Fibrillation;                  Risk Factors:Hypertension and Dyslipidemia. HMIII LVAD                  implanted 09/17/21.  Sonographer:     Raquel Sarna Senior RDCS Referring Phys:  [place] Diagnosing Phys: Kirk Ruths McleanMD IMPRESSIONS  1. Septum  is midline to slightly leftwards with increase in speed to 5200 rpm. Left ventricular ejection fraction, by estimation, is 20 to 25%. The left ventricle has severely decreased function. The left ventricle demonstrates global hypokinesis. There   is mild left ventricular hypertrophy. LVAD inflow cannula at LV apex.  2. Peak RV-RA gradient 22 mmHg. Right ventricular systolic function is mildly reduced. The right ventricular size is moderately enlarged.  3. The tricuspid valve is abnormal. Tricuspid valve regurgitation is severe.  4. Aortic valve opens every beat with speed 5000 rpm. The aortic valve opens only slightly with each beat with speed 5200 rpm. The aortic valve is tricuspid. Aortic valve regurgitation is trivial.  5. Right atrial size was moderately dilated.  6. Limited echo. Only minimal pericardial effusion was noted. FINDINGS  Left Ventricle: Septum is midline to slightly leftwards with increase in speed to 5200 rpm. Left ventricular ejection fraction, by estimation, is 20 to 25%. The left ventricle has severely decreased function. The left ventricle demonstrates global hypokinesis. The left ventricular internal cavity size was normal in size. There is mild left ventricular hypertrophy. Right Ventricle: Peak RV-RA gradient 22 mmHg. The right ventricular size is moderately enlarged. Right ventricular systolic function is mildly reduced. Left Atrium: Left atrial size was normal in size. Right Atrium: Right atrial size was moderately dilated. Pericardium: Trivial pericardial effusion is present. Tricuspid Valve: The tricuspid valve is abnormal. Tricuspid valve regurgitation is severe. Aortic Valve: Aortic valve opens every beat with speed 5000 rpm. The aortic valve opens only slightly with each beat with speed 5200 rpm. The aortic valve is tricuspid. Aortic valve regurgitation is trivial. Additional Comments: A device lead is visualized in the right ventricle. Dary Dilauro [employer] Electronically signed by Franki Monte Signature Date/Time: 10/17/2021/4:17:14 PM    Final (Updated)      Medications:     Scheduled Medications:  amiodarone  200 mg Oral BID   aspirin  81 mg Oral Daily   Chlorhexidine Gluconate Cloth  6 each Topical Q0600    Chlorhexidine Gluconate Cloth  6 each Topical Q0600   darbepoetin (ARANESP) injection - NON-DIALYSIS  150 mcg Subcutaneous Q Mon-1800   docusate sodium  100 mg Oral BID   feeding supplement  237 mL Oral TID BM   mouth rinse  15 mL Mouth Rinse BID   midodrine  5 mg Oral TID WC   mirtazapine  7.5 mg Oral QHS   montelukast  5 mg Oral QHS   multivitamin  1 tablet Oral QHS   pantoprazole  40 mg Oral Daily   rosuvastatin  10 mg Oral Daily   senna  2 tablet Oral QHS   sodium chloride flush  10-40 mL Intracatheter Q12H   sodium chloride flush  3 mL Intravenous Q12H   Warfarin - Pharmacist Dosing Inpatient   Does not apply q1600    Infusions:  sodium chloride Stopped (09/18/21 0803)   sodium chloride Stopped (10/02/21 0900)   sodium chloride     sodium chloride     promethazine (PHENERGAN) injection (IM or IVPB) Stopped (10/10/21 1755)    PRN Medications: sodium chloride, sodium chloride, sodium chloride, acetaminophen, albuterol, alteplase, Gerhardt's butt cream, heparin, lidocaine (PF), lidocaine-prilocaine, lip balm, ondansetron (ZOFRAN) IV, oxyCODONE, pentafluoroprop-tetrafluoroeth, polyethylene glycol, promethazine (PHENERGAN) injection (IM or IVPB), sodium chloride flush, sodium chloride flush, traMADol   Assessment/Plan:    1. Acute on chronic systolic CHF:  Long-standing cardiomyopathy.  [place].  Echo this admission with EF < 20%, severe LV dilation,  restrictive diastolic function, moderate RV dysfunction, moderate MR, mod-severe TR. Cause of cardiomyopathy is uncertain.  He has a history of CAD, but I do not think that the described CAD from the past could explain his cardiomyopathy, but CAD could have progressed.  With difficulty tolerating GDMT/need for midodrine and cardiorenal syndrome as well as profound volume overload + NYHA class IV symptoms,  concerned for low output HF. Co-ox off milrinone was 36%, milrinone started and increased to 0.375 but CO remained low.  NE added and Impella 5.5 placed 12/23. EF 10% on TEE 12/23. s/p HM III VAD on 12/28.  Ramp echo 01/03 with EF 20-25%, RV moderately reduced, septum mid-line.  Ramp echo again on 1/12 showed severely dilated/moderately dysfunctional RV with severe TR and left-shifted septum with small LV.  Speed decreased on 1/12 to 5000 rpm over 2 sessions due to RV failure and milrinone 0.25 + NE 3 restarted.  Echo repeated on 1/16, RV looks better and LV larger but still with left shift of the septum.  Able to increase CVVH, weight down significantly.  Echo again on 1/18, speed decreased to 4900 with RV mildly dysfunctional and severely enlarged with severe TR.  CVVHD stopped 1/20.  1/23 delay in iHD due to staffing, developed increased dyspnea + volume overload CVP >20 and low flow alarms. VAD speed reduced to 4800. CVVH restarted. Milrinone restarted 0.25. Speed increased back to 5000 rpm and then to 5200.  Volume much improved. CVVH stopped 1/25 and had iHD 1/26. He is now off milrinone with CVP 8. Co-ox 54% today. - Plan iHD today for volume management.  - Can decrease midodrine to 5 mg tid.   - If he can come off midodrine (suspect he will), can start sildenafil.  - Warfarin for INR 2-2.5.  INR 1.7 today Discussed dosing with PharmD personally. - Continue 81 mg aspirin - Continue UNNA boots 2. Tricuspid regurgitation:  Tricuspid repair not done at time of VAD d/t proximity of ICD wires and hypotension during surgery.  He has severe TR.  3. Atrial fibrillation: Now in NSR - Continue PO amio 200 bid  4. AKI on CKD stage 3: Gradual rise (was about 1.7 pre-op) since surgery.  He has not been hypotensive post-op, suspect intra-op hypotension led to development of ATN => urine sediment looked like ATN per renal. Got CVVH. TDC placed 1/7. Now off CVVH and now with iHD, tolerating so far.  - Maintain MAP and CO as above - Appreciate Nephrology input 5. CAD: History of PCI to OM1 in 2007 and RCA in 2013.  No CP or ACs.   - Deferred cardiac cath due to AKI and plan for VAD - Continue Crestor.  6. ID: Had fever 12/21, PCT 1.03.  Blood cultures negative. ?Phlebitis at IV site. Post-op fever with elevated WBCs, PCT 2.63 -> 1.78.  He completed course of cefepime. Afebrile with stable WBCs.   7. Constipation: Resolved.  8. Hyponatremia: Hypervolemic hyponatremia.  Controlled via dialysis.  9. Anemia: 1 unit PRBCs on 1/12,  1 unit PRBs 1/13, 1 unit 1/16. No overt bleeding, ?losses via CVVH.  Hgb 8.48today, no evidence bleeding - Transfuse for Hgb <7.5  - Received IV iron x 5 doses per nephrology (finished 1/20) 10. Confusion/delirium: Initially post-op and started on Precedex.  Resolved.  11. F/E/N: TFs via [place] for poor nutritional status, now off.  Improving po intake.  - Added remeron to help with appetite.  12. Left pleural effusion: - s/p thoracentesis 1/7 -  s/p thoracentesis 1/12. 13. Pericardial effusion: Stable moderate pericardial effusion, primarily localized to along the lateral wall of the LV.  Does not appear to be affecting the RV/doubt tamponade.  Discussed after 1/12 and 1/18 echo with Dr. Cyndia Bent, conservative management for now.  14. Right great toe pain: - ? Possible gout.  - Uric acid 13.3 -> 1.6 (?) 15. Hyperkalemia - Resolved w/ HD and lokelma.  16. Deconditioning:  - Ideally to CIR.  - continue to ambulate  If he does well with iHD today, will [facility] to [facility].  Continue to mobilize.   CRITICAL CARE Performed by: Loralie Champagne  Total critical care time: 35 minutes  Critical care time was exclusive of separately billable procedures and treating other patients.  Critical care was necessary to treat or prevent imminent or life-threatening deterioration.  Critical care was time spent personally by me on the following activities: development of treatment plan with patient and/or surrogate as well as nursing, discussions with consultants, evaluation of patient's response to treatment,  examination of patient, obtaining history from patient or surrogate, ordering and performing treatments and interventions, ordering and review of laboratory studies, ordering and review of radiographic studies, pulse oximetry and re-evaluation of patient's condition.  Loralie Champagne 10/18/2021 7:56 AM

## 2021-10-18 NOTE — Progress Notes (Signed)
[facility] Progress Note     Assessment/ Plan:   Acute kidney injury on CKD3b: with a baseline cr in the 1.6-2 range. Acute component either from cardiorenal +/- ATN with decreased renal function from hemodynamics and CPB + hypotensive episodes + pressors.  - CRRT started on 1/3 - stopped mid day 1/20. Has been anuric and BUN-CRT rising-  plan was  for IHD on 1/23-  but delayed and decision was made to put back on CRRT ftom 1/23-1/25 -     then  IHD on 1/26- plan for also for today to keep out of trouble -  has tunneled HD cath in place.  Now has been HD dep for 3 + weeks-  the longer this goes the less likely a possibility for recovery- LVAD and HD is complicated but not impossible, have the renal navigator reaching out to the [facility] unit ( closest to him )  to see if they can accommodate an LVAD   Cardiogenic shock: s/p LVAD 09/17/22 (HM 3).   - on  midodrine now, off NE and off milrinone -  will UF as able with HD   CAD with h/o PCI last in 2013  Anemia- iron indices 1/16 showing iron sat 12%, ferritin 477, Aranesp  60 on 10/06/21. Gave IV iron x 1g over 5 doses. Hgb falling-  have inc darbe  Afib- on amio  Acute hypoxic RF: s/p thoracentesis 1.3 L 10/02/21  Hyperkalemia:  K was consistently over 5, was on lokelma BID- then daily. He's vegetarian/pescatarian and has really struggled to maintain nutrition during admission - have formally restricted diet.  K as low as in the 3's -  holding lokelma for now  [place] for binder need-   PTH 59  Dispo: ICU, ultimately will need to be able to tolerate iHD, moving in that direction now.  As above-  having renal navigator looking into him being able to dialyze at [facility] as OP with LVAD  Subjective:    No c/o's-  no UOP recorded - for HD today -  CIR feels that he is too good for rehab     Objective:   BP 103/81    Pulse 71    Temp 98.7 F (37.1 C) (Oral)    Resp 18    Ht 5\' 9"  (1.753 m)    Wt 69.2 kg     SpO2 96%    BMI 22.53 kg/m   Intake/Output Summary (Last 24 hours) at 10/18/2021 3235 Last data filed at 10/17/2021 1600 Gross per 24 hour  Intake 728.68 ml  Output 0 ml  Net 728.68 ml   Weight change: 0.2 kg  Physical Exam: GEN: NAD, alert, pleasant  HEENT: wearing glasses LUNGS: normal WOB, clear ant CV: LVAD  EXT: minimal edema, much improved, legs wrapped at tibial level ACCESS: rt IJ TC c/d/I-     Imaging: ECHOCARDIOGRAM LIMITED  Result Date: 10/17/2021    ECHOCARDIOGRAM LIMITED REPORT   Patient Name:   Isaac [name] Date of Exam: 10/17/2021 Medical Rec #:  [id number]           Height:       69.0 in Accession #:    [id number]          Weight:       152.1 lb Date of Birth:  1951/04/21            BSA:  1.839 m Patient Age:    71 years            BP:           0/0 mmHg Patient Gender: M                   HR:           60 bpm. Exam Location:  Inpatient Procedure: Limited Echo, Color Doppler and Cardiac Doppler Indications:     Ramp study  History:         Patient has prior history of Echocardiogram examinations, most                  recent 10/08/2021. CHF, CAD, Arrythmias:Atrial Fibrillation;                  Risk Factors:Hypertension and Dyslipidemia. HMIII LVAD                  implanted 09/17/21.  Sonographer:     Raquel Sarna Senior RDCS Referring Phys:  [place] Diagnosing Phys: Kirk Ruths McleanMD IMPRESSIONS  1. Septum is midline to slightly leftwards with increase in speed to 5200 rpm. Left ventricular ejection fraction, by estimation, is 20 to 25%. The left ventricle has severely decreased function. The left ventricle demonstrates global hypokinesis. There  is mild left ventricular hypertrophy. LVAD inflow cannula at LV apex.  2. Peak RV-RA gradient 22 mmHg. Right ventricular systolic function is mildly reduced. The right ventricular size is moderately enlarged.  3. The tricuspid valve is abnormal. Tricuspid valve regurgitation is severe.  4. Aortic valve opens every beat  with speed 5000 rpm. The aortic valve opens only slightly with each beat with speed 5200 rpm. The aortic valve is tricuspid. Aortic valve regurgitation is trivial.  5. Right atrial size was moderately dilated.  6. Limited echo. Only minimal pericardial effusion was noted. FINDINGS  Left Ventricle: Septum is midline to slightly leftwards with increase in speed to 5200 rpm. Left ventricular ejection fraction, by estimation, is 20 to 25%. The left ventricle has severely decreased function. The left ventricle demonstrates global hypokinesis. The left ventricular internal cavity size was normal in size. There is mild left ventricular hypertrophy. Right Ventricle: Peak RV-RA gradient 22 mmHg. The right ventricular size is moderately enlarged. Right ventricular systolic function is mildly reduced. Left Atrium: Left atrial size was normal in size. Right Atrium: Right atrial size was moderately dilated. Pericardium: Trivial pericardial effusion is present. Tricuspid Valve: The tricuspid valve is abnormal. Tricuspid valve regurgitation is severe. Aortic Valve: Aortic valve opens every beat with speed 5000 rpm. The aortic valve opens only slightly with each beat with speed 5200 rpm. The aortic valve is tricuspid. Aortic valve regurgitation is trivial. Additional Comments: A device lead is visualized in the right ventricle. [place] McleanMD Electronically signed by Franki Monte Signature Date/Time: 10/17/2021/4:17:14 PM    Final (Updated)     Labs: BMET Recent Labs  Lab 10/14/21 0401 10/14/21 1934 10/15/21 0356 10/16/21 0425 10/16/21 1621 10/17/21 0436 10/18/21 0440  NA 136 136 132* 135 132* 131* 136  K 5.0 4.3 4.3 4.2 3.9 4.0 4.5  CL 102 102 99 102 94* 97* 99  CO2 25 25 25 24 27 26 24   GLUCOSE 131* 133* 91 98 117* 113* 87  BUN 38* 26* 20 32* 13 19 31*  CREATININE 3.68* 2.53* 2.16* 3.41* 2.01* 2.81* 3.93*  CALCIUM 8.2* 8.2* 8.3* 8.8* 8.4* 8.2* 8.6*  PHOS 4.5 2.9  2.8 4.0 2.6 3.5 4.9*   CBC Recent Labs   Lab 10/16/21 0425 10/16/21 1621 10/17/21 0435 10/18/21 0440  WBC 9.5 8.6 9.7 10.4  HGB 8.8* 9.0* 8.4* 8.8*  HCT 27.7* 28.7* 26.9* 28.2*  MCV 95.2 93.5 94.4 94.3  PLT 627* 706* 617* 604*    Medications:     amiodarone  200 mg Oral BID   aspirin  81 mg Oral Daily   Chlorhexidine Gluconate Cloth  6 each Topical Q0600   Chlorhexidine Gluconate Cloth  6 each Topical Q0600   darbepoetin (ARANESP) injection - NON-DIALYSIS  150 mcg Subcutaneous Q Mon-1800   docusate sodium  100 mg Oral BID   feeding supplement  237 mL Oral TID BM   mouth rinse  15 mL Mouth Rinse BID   midodrine  10 mg Oral TID WC   mirtazapine  7.5 mg Oral QHS   montelukast  5 mg Oral QHS   multivitamin  1 tablet Oral QHS   pantoprazole  40 mg Oral Daily   rosuvastatin  10 mg Oral Daily   senna  2 tablet Oral QHS   sodium chloride flush  10-40 mL Intracatheter Q12H   sodium chloride flush  3 mL Intravenous Q12H   Warfarin - Pharmacist Dosing Inpatient   Does not apply [facility] Pager [phone]

## 2021-10-18 NOTE — Progress Notes (Signed)
[place] for Warfarin Indication: LVAD/AF  Allergies  Allergen Reactions   Mushroom Extract Complex Nausea And Vomiting   Neosporin [Neomycin-Bacitracin Zn-Polymyx] Hives    Patient Measurements: Height: 5\' 9"  (175.3 cm) Weight: 69.2 kg (152 lb 8.9 oz) IBW/kg (Calculated) : 70.7 Heparin Dosing Weight: 83.7 kg  Vital Signs: Temp: 98.2 F (36.8 C) (01/28 0845) Temp Source: Oral (01/28 0845) BP: 108/68 (01/28 0930)  Labs: Recent Labs    10/16/21 0425 10/16/21 1621 10/17/21 0435 10/17/21 0436 10/18/21 0440  HGB 8.8* 9.0* 8.4*  --  8.8*  HCT 27.7* 28.7* 26.9*  --  28.2*  PLT 627* 706* 617*  --  604*  LABPROT 20.3*  --  20.4*  --  19.9*  INR 1.7*  --  1.8*  --  1.7*  CREATININE 3.41* 2.01*  --  2.81* 3.93*     Estimated Creatinine Clearance: 17.4 mL/min (A) (by C-G formula based on SCr of 3.93 mg/dL (H)).   Medical History: Past Medical History:  Diagnosis Date   Arrhythmia    atrial fibrillation   CHF (congestive heart failure) (HCC)    Chronic kidney disease    Coronary artery disease    Hyperlipidemia    Hypertension    Myocardial infarct [facility])     Assessment: 71 yo male on chronic Eliquis PTA for afib.  Now s/p LVAD placement 09/17/21, pharmacy asked to begin anticoagulation with warfarin.   Patient previously therapeutic on lower doses 1-2mg  daily prior to tube feed being started for poor nutritional intake.   INR today is subtherapeutic at 1.7, CBC and LDH stable. Pt endorses increasing appetite and has been consistently having between one and two ensures daily but none yesterday. No signs or symptoms of bleeding.  Goal of Therapy:  INR 2-2.5 Monitor platelets by anticoagulation protocol: Yes   Plan:  -Warfarin 2 mg PO x1 today -Daily INR, CBC, LDH  Erin Hearing PharmD., BCPS Clinical Pharmacist 10/18/2021 9:35 AM

## 2021-10-19 DIAGNOSIS — Z95811 Presence of heart assist device: Secondary | ICD-10-CM | POA: Diagnosis not present

## 2021-10-19 DIAGNOSIS — R570 Cardiogenic shock: Secondary | ICD-10-CM | POA: Diagnosis not present

## 2021-10-19 LAB — COOXEMETRY PANEL
Carboxyhemoglobin: 2 % — ABNORMAL HIGH (ref 0.5–1.5)
Methemoglobin: 1 % (ref 0.0–1.5)
O2 Saturation: 50.9 %
Total hemoglobin: 8.7 g/dL — ABNORMAL LOW (ref 12.0–16.0)

## 2021-10-19 LAB — GLUCOSE, CAPILLARY
Glucose-Capillary: 77 mg/dL (ref 70–99)
Glucose-Capillary: 69 mg/dL — ABNORMAL LOW (ref 70–99)
Glucose-Capillary: 82 mg/dL (ref 70–99)
Glucose-Capillary: 85 mg/dL (ref 70–99)
Glucose-Capillary: 90 mg/dL (ref 70–99)
Glucose-Capillary: 121 mg/dL — ABNORMAL HIGH (ref 70–99)

## 2021-10-19 LAB — BASIC METABOLIC PANEL
Anion gap: 11 (ref 5–15)
BUN: 19 mg/dL (ref 8–23)
CO2: 26 mmol/L (ref 22–32)
Calcium: 8.5 mg/dL — ABNORMAL LOW (ref 8.9–10.3)
Chloride: 99 mmol/L (ref 98–111)
Creatinine, Ser: 3.23 mg/dL — ABNORMAL HIGH (ref 0.61–1.24)
GFR, Estimated: 20 mL/min — ABNORMAL LOW (ref >60)
Glucose, Bld: 78 mg/dL (ref 70–99)
Potassium: 3.9 mmol/L (ref 3.5–5.1)
Sodium: 136 mmol/L (ref 135–145)

## 2021-10-19 LAB — RENAL FUNCTION PANEL
Albumin: 2.1 g/dL — ABNORMAL LOW (ref 3.5–5.0)
Anion gap: 11 (ref 5–15)
BUN: 19 mg/dL (ref 8–23)
CO2: 26 mmol/L (ref 22–32)
Calcium: 8.6 mg/dL — ABNORMAL LOW (ref 8.9–10.3)
Chloride: 100 mmol/L (ref 98–111)
Creatinine, Ser: 3.22 mg/dL — ABNORMAL HIGH (ref 0.61–1.24)
GFR, Estimated: 20 mL/min — ABNORMAL LOW (ref >60)
Glucose, Bld: 78 mg/dL (ref 70–99)
Phosphorus: 4.6 mg/dL (ref 2.5–4.6)
Potassium: 3.9 mmol/L (ref 3.5–5.1)
Sodium: 137 mmol/L (ref 135–145)

## 2021-10-19 LAB — CBC
HCT: 28.7 % — ABNORMAL LOW (ref 39.0–52.0)
Hemoglobin: 9 g/dL — ABNORMAL LOW (ref 13.0–17.0)
MCH: 29.9 pg (ref 26.0–34.0)
MCHC: 31.4 g/dL (ref 30.0–36.0)
MCV: 95.3 fL (ref 80.0–100.0)
Platelets: 590 10*3/uL — ABNORMAL HIGH (ref 150–400)
RBC: 3.01 MIL/uL — ABNORMAL LOW (ref 4.22–5.81)
RDW: 18.7 % — ABNORMAL HIGH (ref 11.5–15.5)
WBC: 9.3 10*3/uL (ref 4.0–10.5)
nRBC: 0 % (ref 0.0–0.2)

## 2021-10-19 LAB — PROTIME-INR
INR: 1.6 — ABNORMAL HIGH (ref 0.8–1.2)
Prothrombin Time: 19.3 seconds — ABNORMAL HIGH (ref 11.4–15.2)

## 2021-10-19 LAB — HEPATITIS B SURFACE ANTIBODY, QUANTITATIVE: Hep B S AB Quant (Post): 406.6 m[IU]/mL (ref >9.9)

## 2021-10-19 LAB — LACTATE DEHYDROGENASE: LDH: 210 U/L — ABNORMAL HIGH (ref 98–192)

## 2021-10-19 LAB — MAGNESIUM: Magnesium: 1.9 mg/dL (ref 1.7–2.4)

## 2021-10-19 MED ORDER — AMIODARONE HCL 200 MG PO TABS
200.0000 mg | ORAL_TABLET | Freq: Every day | ORAL | Status: DC
  Administered 2021-10-19 – 2021-10-24 (×6): 200 mg via ORAL
  Filled 2021-10-19 (×6): qty 1

## 2021-10-19 MED ORDER — METOCLOPRAMIDE HCL 5 MG/ML IJ SOLN
10.0000 mg | Freq: Four times a day (QID) | INTRAMUSCULAR | Status: DC
Start: 2021-10-19 — End: 2021-10-19

## 2021-10-19 MED ORDER — WARFARIN SODIUM 2 MG PO TABS
4.0000 mg | ORAL_TABLET | Freq: Once | ORAL | Status: AC
  Administered 2021-10-19: 4 mg via ORAL
  Filled 2021-10-19: qty 2

## 2021-10-19 MED ORDER — SILDENAFIL CITRATE 20 MG PO TABS
20.0000 mg | ORAL_TABLET | Freq: Three times a day (TID) | ORAL | Status: DC
  Administered 2021-10-19 – 2021-10-23 (×15): 20 mg via ORAL
  Filled 2021-10-19 (×15): qty 1

## 2021-10-19 MED ORDER — CHLORHEXIDINE GLUCONATE CLOTH 2 % EX PADS
6.0000 | MEDICATED_PAD | Freq: Every day | CUTANEOUS | Status: DC
  Administered 2021-10-20 – 2021-10-21 (×2): 6 via TOPICAL

## 2021-10-19 NOTE — Progress Notes (Signed)
Patient ID: Isaac [name], male   DOB: 06-21-1951, 71 y.o.   MRN: [id number]    Advanced Heart Failure VAD Team Note  PCP-Cardiologist: None   Subjective:    12/19 RHC- RA 7, PA 39/14 (25), PCWP 11, CO 6.4 CI 3. Thermo 3.5 1.8. Lasix drip stopped after cath. 12/20 Swan removed.  Norepi 3 mcg added.  12/21 Fever. Blood CX drawn. UA + leukocytes. Started on vanc and cefepime. Diuresed with IV lasix + metolazone.  12/23 Impella 5.5 placed 12/28 HM III LVAD, ICD leads plastered to tricuspid valve with severe TR, valve not replaced and some improvement in TR with LVAD placement.  12/29 Extubated 01/03 Started CRRT 01/07  L thoracentesis and placement of [facility] 01/09 LVAD speed increased to 5600 rpm 01/10 Milrinone stopped. Amio gtt>>PO. Completed 5 day course Cefepime.  01/12 Ramp echo done: Moderate lateral pericardial effusion w/o tamponade, RV larger with severe dilation/moderate dysfunction and left shift of the septum, LV small and undershifted.  TR severe.  LVAD speed decreased to 5000 rpms at 2 different sessions.  Also had thoracentesis on left with 1.3 L off.  01/13 TFs begun 01/16 echo reviewed: EF 25-30%, LV larger than prior but septum still with left shift, RV moderately dilated and moderately dysfunctional, moderate-severe TR, mod-large localized pericardial effusion lateral to the LV.  1/18 Ramp Echo with speed decreased to 4900  1/21 CVVHD stopped 1/23 delay in iHD due to staffing, developed increased dyspnea + volume overload CVP >20 and low flow alarms. VAD speed reduced to 4800 then increased back to 5000. CVVH restarted. Milrinone restarted 0.25.  1/25 CVVH stopped 1/26 Tolerated initial iHD.  1/27 Echo done, speed increased to 5200 rpm. Stopped milrinone.   Co-ox 51% early am off milrinone. CVP 10 this morning.  MAP 90s on midodrine 5 tid.  HD yesterday with 2 L off.   INR 1.6 today, hgb 9.  LDH 210.   LVAD INTERROGATION:  HeartMate III LVAD:   Flow 3.6 liters/min,  speed 5200, power 3.7 PI  7.5. 1 PI event and 1 low flow overnight. VAD interrogated personally. Parameters stable.     Objective:    Vital Signs:   Temp:  [97.7 F (36.5 C)-98.6 F (37 C)] 97.8 F (36.6 C) (01/29 0746) Pulse Rate:  [60] 60 (01/28 2000) Resp:  [11-28] 19 (01/29 0500) BP: (86-125)/(61-103) 122/102 (01/29 0500) SpO2:  [100 %] 100 % (01/28 2000) Weight:  [66.9 kg-69.2 kg] 66.9 kg (01/29 0500) Last BM Date: 10/18/21 Mean arterial Pressure 90s   Intake/Output:   Intake/Output Summary (Last 24 hours) at 10/19/2021 0829 Last data filed at 10/18/2021 2200 Gross per 24 hour  Intake 370 ml  Output 2000 ml  Net -1630 ml     Physical Exam   CVP 10 General: Well appearing this am. NAD.  HEENT: Normal. Neck: Supple, JVP 10 cm. Carotids OK.  Cardiac:  Mechanical heart sounds with LVAD hum present.  Lungs:  CTAB, normal effort.  Abdomen:  NT, ND, no HSM. No bruits or masses. +BS  LVAD exit site: Well-healed and incorporated. Dressing dry and intact. No erythema or drainage. Stabilization device present and accurately applied. Driveline dressing changed daily per sterile technique. Extremities:  Warm and dry. No cyanosis, clubbing, rash, or edema.  Neuro:  Alert & oriented x 3. Cranial nerves grossly intact. Moves all 4 extremities w/o difficulty. Affect pleasant     Telemetry   A-paced 60s Personally reviewed  Labs   Basic Metabolic  Panel: Recent Labs  Lab 10/15/21 0356 10/16/21 0425 10/16/21 1621 10/17/21 0435 10/17/21 0436 10/18/21 0440 10/19/21 0337  NA 132* 135 132*  --  131* 136 136   137  K 4.3 4.2 3.9  --  4.0 4.5 3.9   3.9  CL 99 102 94*  --  97* 99 99   100  CO2 25 24 27   --  26 24 26   26   GLUCOSE 91 98 117*  --  113* 87 78   78  BUN 20 32* 13  --  19 31* 19   19  CREATININE 2.16* 3.41* 2.01*  --  2.81* 3.93* 3.23*   3.22*  CALCIUM 8.3* 8.8* 8.4*  --  8.2* 8.6* 8.5*   8.6*  MG 2.6* 2.4  --  2.0  --  2.1 1.9  PHOS 2.8 4.0 2.6  --  3.5 4.9*  4.6    Liver Function Tests: Recent Labs  Lab 10/16/21 0425 10/16/21 1621 10/17/21 0436 10/18/21 0440 10/19/21 0337  ALBUMIN 2.1* 2.2* 2.0* 2.0* 2.1*   No results for input(s): LIPASE, AMYLASE in the last 168 hours. No results for input(s): AMMONIA in the last 168 hours.   CBC: Recent Labs  Lab 10/16/21 0425 10/16/21 1621 10/17/21 0435 10/18/21 0440 10/19/21 0337  WBC 9.5 8.6 9.7 10.4 9.3  HGB 8.8* 9.0* 8.4* 8.8* 9.0*  HCT 27.7* 28.7* 26.9* 28.2* 28.7*  MCV 95.2 93.5 94.4 94.3 95.3  PLT 627* 706* 617* 604* 590*    INR: Recent Labs  Lab 10/15/21 0356 10/16/21 0425 10/17/21 0435 10/18/21 0440 10/19/21 0337  INR 2.2* 1.7* 1.8* 1.7* 1.6*    Other results:   Imaging   ECHOCARDIOGRAM LIMITED  Result Date: 10/17/2021    ECHOCARDIOGRAM LIMITED REPORT   Patient Name:   Isaac [name] Date of Exam: 10/17/2021 Medical Rec #:  [id number]           Height:       69.0 in Accession #:    [id number]          Weight:       152.1 lb Date of Birth:  03/12/1951            BSA:          1.839 m Patient Age:    65 years            BP:           0/0 mmHg Patient Gender: M                   HR:           60 bpm. Exam Location:  Inpatient Procedure: Limited Echo, Color Doppler and Cardiac Doppler Indications:     Ramp study  History:         Patient has prior history of Echocardiogram examinations, most                  recent 10/08/2021. CHF, CAD, Arrythmias:Atrial Fibrillation;                  Risk Factors:Hypertension and Dyslipidemia. HMIII LVAD                  implanted 09/17/21.  Sonographer:     Raquel Sarna Senior RDCS Referring Phys:  [place] Diagnosing Phys: Kirk Ruths McleanMD IMPRESSIONS  1. Septum is midline to slightly leftwards with increase in speed to 5200 rpm. Left ventricular ejection  fraction, by estimation, is 20 to 25%. The left ventricle has severely decreased function. The left ventricle demonstrates global hypokinesis. There  is mild left ventricular  hypertrophy. LVAD inflow cannula at LV apex.  2. Peak RV-RA gradient 22 mmHg. Right ventricular systolic function is mildly reduced. The right ventricular size is moderately enlarged.  3. The tricuspid valve is abnormal. Tricuspid valve regurgitation is severe.  4. Aortic valve opens every beat with speed 5000 rpm. The aortic valve opens only slightly with each beat with speed 5200 rpm. The aortic valve is tricuspid. Aortic valve regurgitation is trivial.  5. Right atrial size was moderately dilated.  6. Limited echo. Only minimal pericardial effusion was noted. FINDINGS  Left Ventricle: Septum is midline to slightly leftwards with increase in speed to 5200 rpm. Left ventricular ejection fraction, by estimation, is 20 to 25%. The left ventricle has severely decreased function. The left ventricle demonstrates global hypokinesis. The left ventricular internal cavity size was normal in size. There is mild left ventricular hypertrophy. Right Ventricle: Peak RV-RA gradient 22 mmHg. The right ventricular size is moderately enlarged. Right ventricular systolic function is mildly reduced. Left Atrium: Left atrial size was normal in size. Right Atrium: Right atrial size was moderately dilated. Pericardium: Trivial pericardial effusion is present. Tricuspid Valve: The tricuspid valve is abnormal. Tricuspid valve regurgitation is severe. Aortic Valve: Aortic valve opens every beat with speed 5000 rpm. The aortic valve opens only slightly with each beat with speed 5200 rpm. The aortic valve is tricuspid. Aortic valve regurgitation is trivial. Additional Comments: A device lead is visualized in the right ventricle. Undray All[employer] Electronically signed by Franki Monte Signature Date/Time: 10/17/2021/4:17:14 PM    Final (Updated)      Medications:     Scheduled Medications:  amiodarone  200 mg Oral BID   Chlorhexidine Gluconate Cloth  6 each Topical Q0600   darbepoetin (ARANESP) injection - NON-DIALYSIS  150 mcg  Subcutaneous Q Mon-1800   docusate sodium  100 mg Oral BID   feeding supplement  237 mL Oral TID BM   mouth rinse  15 mL Mouth Rinse BID   mirtazapine  7.5 mg Oral QHS   montelukast  5 mg Oral QHS   multivitamin  1 tablet Oral QHS   pantoprazole  40 mg Oral Daily   rosuvastatin  10 mg Oral Daily   senna  2 tablet Oral QHS   sildenafil  20 mg Oral TID   sodium chloride flush  10-40 mL Intracatheter Q12H   sodium chloride flush  3 mL Intravenous Q12H   Warfarin - Pharmacist Dosing Inpatient   Does not apply q1600    Infusions:  sodium chloride Stopped (09/18/21 0803)   sodium chloride Stopped (10/02/21 0900)   sodium chloride     sodium chloride     promethazine (PHENERGAN) injection (IM or IVPB) Stopped (10/10/21 1755)    PRN Medications: sodium chloride, sodium chloride, sodium chloride, acetaminophen, albuterol, alteplase, Gerhardt's butt cream, heparin, heparin, lidocaine (PF), lidocaine-prilocaine, lip balm, ondansetron (ZOFRAN) IV, oxyCODONE, pentafluoroprop-tetrafluoroeth, polyethylene glycol, promethazine (PHENERGAN) injection (IM or IVPB), sodium chloride flush, sodium chloride flush, traMADol   Assessment/Plan:    1. Acute on chronic systolic CHF:  Long-standing cardiomyopathy.  [place].  Echo this admission with EF < 20%, severe LV dilation, restrictive diastolic function, moderate RV dysfunction, moderate MR, mod-severe TR. Cause of cardiomyopathy is uncertain.  He has a history of CAD, but I do not think that the described CAD from the  past could explain his cardiomyopathy, but CAD could have progressed.  With difficulty tolerating GDMT/need for midodrine and cardiorenal syndrome as well as profound volume overload + NYHA class IV symptoms,  concerned for low output HF. Co-ox off milrinone was 36%, milrinone started and increased to 0.375 but CO remained low. NE added and Impella 5.5 placed 12/23. EF 10% on TEE 12/23. s/p HM III VAD on 12/28.  Ramp echo 01/03  with EF 20-25%, RV moderately reduced, septum mid-line.  Ramp echo again on 1/12 showed severely dilated/moderately dysfunctional RV with severe TR and left-shifted septum with small LV.  Speed decreased on 1/12 to 5000 rpm over 2 sessions due to RV failure and milrinone 0.25 + NE 3 restarted.  Echo repeated on 1/16, RV looks better and LV larger but still with left shift of the septum.  Able to increase CVVH, weight down significantly.  Echo again on 1/18, speed decreased to 4900 with RV mildly dysfunctional and severely enlarged with severe TR.  CVVHD stopped 1/20.  1/23 delay in iHD due to staffing, developed increased dyspnea + volume overload CVP >20 and low flow alarms. VAD speed reduced to 4800. CVVH restarted. Milrinone restarted 0.25. Speed increased back to 5000 rpm and then to 5200.  Volume much improved. CVVH stopped 1/25 and had iHD 1/26 and 1/28. He is now off milrinone with CVP 10. Co-ox 51% early am today. - Next HD planned for Tuesday.  - Stop midodrine.   - Start sildenafil 20 tid for RV.   - Warfarin for INR 2-2.5.  INR 1.6 today Discussed dosing with PharmD personally. - Continue 81 mg aspirin - Continue UNNA boots 2. Tricuspid regurgitation:  Tricuspid repair not done at time of VAD d/t proximity of ICD wires and hypotension during surgery.  He has severe TR.  3. Atrial fibrillation: Now in NSR - Can decrease amiodarone to 200 mg daily . 4. AKI on CKD stage 3: Gradual rise (was about 1.7 pre-op) since surgery.  He has not been hypotensive post-op, suspect intra-op hypotension led to development of ATN => urine sediment looked like ATN per renal. Got CVVH. TDC placed 1/7. Now off CVVH and now with iHD, tolerating so far.  - Maintain MAP and CO as above - Appreciate Nephrology input 5. CAD: History of PCI to OM1 in 2007 and RCA in 2013.  No CP or ACs.  - Deferred cardiac cath due to AKI and plan for VAD - Continue Crestor.  6. ID: Had fever 12/21, PCT 1.03.  Blood cultures  negative. ?Phlebitis at IV site. Post-op fever with elevated WBCs, PCT 2.63 -> 1.78.  He completed course of cefepime. Afebrile with stable WBCs.   7. Constipation: Resolved.  8. Hyponatremia: Hypervolemic hyponatremia.  Controlled via dialysis.  9. Anemia: 1 unit PRBCs on 1/12,  1 unit PRBs 1/13, 1 unit 1/16. No overt bleeding, ?losses via CVVH.  Hgb 9 today, no evidence bleeding - Transfuse for Hgb <7.5  - Received IV iron x 5 doses per nephrology (finished 1/20) 10. Confusion/delirium: Initially post-op and started on Precedex.  Resolved.  11. F/E/N: TFs via [place] for poor nutritional status, now off.  Improving po intake.  - Added remeron to help with appetite.  12. Left pleural effusion: - s/p thoracentesis 1/7 - s/p thoracentesis 1/12. 13. Pericardial effusion: Stable moderate pericardial effusion, primarily localized to along the lateral wall of the LV.  Does not appear to be affecting the RV/doubt tamponade.  Discussed after 1/12 and 1/18  echo with Dr. Cyndia Bent, conservative management for now.  14. Right great toe pain: - ? Possible gout.  15. Hyperkalemia - Resolved w/ HD and lokelma.  16. Deconditioning:  - continue to ambulate  Transfer to [place].   Loralie Champagne 10/19/2021 8:29 AM

## 2021-10-19 NOTE — Progress Notes (Addendum)
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 Acute component either from cardiorenal +/- ATN with decreased renal function from hemodynamics and CPB + hypotensive episodes + pressors.  - CRRT started on 1/3 - stopped mid day 1/20. Has been anuric and BUN-CRT rising-  plan was  for IHD on 1/23-  but delayed and decision was made to put back on CRRT ftom 1/23-1/25 -     then  IHD on 1/26- and on 1/28-  able to remove 2 liters without issue -  has tunneled HD cath in place.  Now has been HD dep for 3 + weeks-  the longer this goes the less likely a possibility for recovery- LVAD and HD is complicated but not impossible, have the renal navigator reaching out to the [facility] unit ( closest to him )  to see if they can accommodate an LVAD.  Pt has told Dr. Aundra Dubin that he worships on Saturday and is highly desirous of a MWF schedule so will run him tomorrow and pass that on the the navigator   Cardiogenic shock: s/p LVAD 09/17/22 (HM 3).   - on  midodrine now, off NE and off milrinone -   UF as able with HD   CAD with h/o PCI last in 2013  Anemia- iron indices 1/16 showing iron sat 12%, ferritin 477, Aranesp  60 on 10/06/21. Gave IV iron x 1g over 5 doses. Hgb falling-  have inc darbe  Afib- on amio  Acute hypoxic RF: s/p thoracentesis 1.3 L 10/02/21  Hyperkalemia:  K was consistently over 5, was on lokelma. He's vegetarian/pescatarian and has really struggled to maintain nutrition during admission - have not formally restricted diet.  K as low as in the 3's of late -  holding lokelma for now  [place] for binder need-   PTH 59  Dispo: ICU, ultimately will need to be able to tolerate iHD, moving in that direction now.  As above-  having renal navigator looking into him being able to dialyze at [facility] as OP with LVAD  Subjective:    No c/o's-  no UOP recorded - removed 2 liters with HD and  tolerated well     Objective:   BP (!) 122/102    Pulse 60    Temp 98.4 F (36.9 C) (Oral)    Resp 19    Ht 5\' 9"  (1.753 m)    Wt 66.9 kg    SpO2 100%    BMI 21.78 kg/m   Intake/Output Summary (Last 24 hours) at 10/19/2021 2952 Last data filed at 10/18/2021 2200 Gross per 24 hour  Intake 370 ml  Output 2000 ml  Net -1630 ml   Weight change: 0 kg  Physical Exam: GEN: NAD, alert, pleasant  HEENT: wearing glasses LUNGS: normal WOB, clear ant CV: LVAD  EXT: minimal edema, much improved, legs wrapped at tibial level ACCESS: rt IJ TC c/d/I-     Imaging: ECHOCARDIOGRAM LIMITED  Result Date: 10/17/2021    ECHOCARDIOGRAM LIMITED REPORT   Patient Name:   Isaac [name] Date of Exam: 10/17/2021 Medical Rec #:  [id number]           Height:       69.0 in Accession #:    [id number]          Weight:  152.1 lb Date of Birth:  [birth year]            BSA:          1.839 m Patient Age:    71 years            BP:           0/0 mmHg Patient Gender: M                   HR:           60 bpm. Exam Location:  Inpatient Procedure: Limited Echo, Color Doppler and Cardiac Doppler Indications:     Ramp study  History:         Patient has prior history of Echocardiogram examinations, most                  recent 10/08/2021. CHF, CAD, Arrythmias:Atrial Fibrillation;                  Risk Factors:Hypertension and Dyslipidemia. HMIII LVAD                  implanted 09/17/21.  Sonographer:     Raquel Sarna Senior RDCS Referring Phys:  [place] Diagnosing Phys: Kirk Ruths McleanMD IMPRESSIONS  1. Septum is midline to slightly leftwards with increase in speed to 5200 rpm. Left ventricular ejection fraction, by estimation, is 20 to 25%. The left ventricle has severely decreased function. The left ventricle demonstrates global hypokinesis. There  is mild left ventricular hypertrophy. LVAD inflow cannula at LV apex.  2. Peak RV-RA gradient 22 mmHg. Right ventricular systolic function is mildly reduced. The right  ventricular size is moderately enlarged.  3. The tricuspid valve is abnormal. Tricuspid valve regurgitation is severe.  4. Aortic valve opens every beat with speed 5000 rpm. The aortic valve opens only slightly with each beat with speed 5200 rpm. The aortic valve is tricuspid. Aortic valve regurgitation is trivial.  5. Right atrial size was moderately dilated.  6. Limited echo. Only minimal pericardial effusion was noted. FINDINGS  Left Ventricle: Septum is midline to slightly leftwards with increase in speed to 5200 rpm. Left ventricular ejection fraction, by estimation, is 20 to 25%. The left ventricle has severely decreased function. The left ventricle demonstrates global hypokinesis. The left ventricular internal cavity size was normal in size. There is mild left ventricular hypertrophy. Right Ventricle: Peak RV-RA gradient 22 mmHg. The right ventricular size is moderately enlarged. Right ventricular systolic function is mildly reduced. Left Atrium: Left atrial size was normal in size. Right Atrium: Right atrial size was moderately dilated. Pericardium: Trivial pericardial effusion is present. Tricuspid Valve: The tricuspid valve is abnormal. Tricuspid valve regurgitation is severe. Aortic Valve: Aortic valve opens every beat with speed 5000 rpm. The aortic valve opens only slightly with each beat with speed 5200 rpm. The aortic valve is tricuspid. Aortic valve regurgitation is trivial. Additional Comments: A device lead is visualized in the right ventricle. Dalton McleanMD Electronically signed by Franki Monte Signature Date/Time: 10/17/2021/4:17:14 PM    Final (Updated)     Labs: BMET Recent Labs  Lab 10/14/21 1934 10/15/21 8250 10/16/21 0425 10/16/21 1621 10/17/21 0436 10/18/21 0440 10/19/21 0337  NA 136 132* 135 132* 131* 136 136   137  K 4.3 4.3 4.2 3.9 4.0 4.5 3.9   3.9  CL 102 99 102 94* 97* 99 99   100  CO2 25 25 24 27 26 24 26    26  GLUCOSE 133* 91 98 117* 113* 87 78   78  BUN 26* 20  32* 13 19 31* 19   19  CREATININE 2.53* 2.16* 3.41* 2.01* 2.81* 3.93* 3.23*   3.22*  CALCIUM 8.2* 8.3* 8.8* 8.4* 8.2* 8.6* 8.5*   8.6*  PHOS 2.9 2.8 4.0 2.6 3.5 4.9* 4.6   CBC Recent Labs  Lab 10/16/21 1621 10/17/21 0435 10/18/21 0440 10/19/21 0337  WBC 8.6 9.7 10.4 9.3  HGB 9.0* 8.4* 8.8* 9.0*  HCT 28.7* 26.9* 28.2* 28.7*  MCV 93.5 94.4 94.3 95.3  PLT 706* 617* 604* 590*    Medications:     amiodarone  200 mg Oral BID   Chlorhexidine Gluconate Cloth  6 each Topical Q0600   darbepoetin (ARANESP) injection - NON-DIALYSIS  150 mcg Subcutaneous Q Mon-1800   docusate sodium  100 mg Oral BID   feeding supplement  237 mL Oral TID BM   mouth rinse  15 mL Mouth Rinse BID   midodrine  5 mg Oral TID WC   mirtazapine  7.5 mg Oral QHS   montelukast  5 mg Oral QHS   multivitamin  1 tablet Oral QHS   pantoprazole  40 mg Oral Daily   rosuvastatin  10 mg Oral Daily   senna  2 tablet Oral QHS   sodium chloride flush  10-40 mL Intracatheter Q12H   sodium chloride flush  3 mL Intravenous Q12H   Warfarin - Pharmacist Dosing Inpatient   Does not apply [facility] Pager [phone]

## 2021-10-19 NOTE — Progress Notes (Addendum)
Isaac [name] for Warfarin Indication: LVAD/AF  Allergies  Allergen Reactions   Mushroom Extract Complex Nausea And Vomiting   Neosporin [Neomycin-Bacitracin Zn-Polymyx] Hives    Patient Measurements: Height: 5\' 9"  (175.3 cm) Weight: 66.9 kg (147 lb 7.8 oz) IBW/kg (Calculated) : 70.7 Heparin Dosing Weight: 83.7 kg  Vital Signs: Temp: 97.8 F (36.6 C) (01/29 0746) Temp Source: Oral (01/29 0746) BP: 122/102 (01/29 0500)  Labs: Recent Labs    10/17/21 0435 10/17/21 0436 10/18/21 0440 10/19/21 0337  HGB 8.4*  --  8.8* 9.0*  HCT 26.9*  --  28.2* 28.7*  PLT 617*  --  604* 590*  LABPROT 20.4*  --  19.9* 19.3*  INR 1.8*  --  1.7* 1.6*  CREATININE  --  2.81* 3.93* 3.23*   3.22*     Estimated Creatinine Clearance: 20.5 mL/min (A) (by C-G formula based on SCr of 3.22 mg/dL (H)).   Medical History: Past Medical History:  Diagnosis Date   Arrhythmia    atrial fibrillation   CHF (congestive heart failure) (HCC)    Chronic kidney disease    Coronary artery disease    Hyperlipidemia    Hypertension    Myocardial infarct [facility])     Assessment: 71 yo male on chronic Eliquis PTA for afib.  Now s/p LVAD placement 09/17/21, pharmacy asked to begin anticoagulation with warfarin.   Patient previously therapeutic on lower doses 1-2mg  daily prior to tube feed being started for poor nutritional intake.   INR continues to trend down to 1.6. Discussed with cardiology. Will give boosted dose today. In the past he has been very sensitive to dose changes. CBC and LDH stable. No signs or symptoms of bleeding.  Amiodarone dose reduced to 200mg  daily.  Goal of Therapy:  INR 2-2.5 Monitor platelets by anticoagulation protocol: Yes   Plan:  -Warfarin 4 mg PO x1 today to curb INR and hopefully keep off IV heparin infusion -Daily INR, CBC, LDH  Erin Hearing PharmD., BCPS Clinical Pharmacist 10/19/2021 8:45 AM

## 2021-10-20 DIAGNOSIS — R570 Cardiogenic shock: Secondary | ICD-10-CM | POA: Diagnosis not present

## 2021-10-20 LAB — COOXEMETRY PANEL
Carboxyhemoglobin: 2 % — ABNORMAL HIGH (ref 0.5–1.5)
Methemoglobin: 0.6 % (ref 0.0–1.5)
O2 Saturation: 57.5 %
Total hemoglobin: 8 g/dL — ABNORMAL LOW (ref 12.0–16.0)

## 2021-10-20 LAB — CBC
HCT: 27.2 % — ABNORMAL LOW (ref 39.0–52.0)
Hemoglobin: 8.6 g/dL — ABNORMAL LOW (ref 13.0–17.0)
MCH: 29.5 pg (ref 26.0–34.0)
MCHC: 31.6 g/dL (ref 30.0–36.0)
MCV: 93.2 fL (ref 80.0–100.0)
Platelets: 523 10*3/uL — ABNORMAL HIGH (ref 150–400)
RBC: 2.92 MIL/uL — ABNORMAL LOW (ref 4.22–5.81)
RDW: 18.3 % — ABNORMAL HIGH (ref 11.5–15.5)
WBC: 8.8 10*3/uL (ref 4.0–10.5)
nRBC: 0 % (ref 0.0–0.2)

## 2021-10-20 LAB — RENAL FUNCTION PANEL
Albumin: 2.5 g/dL — ABNORMAL LOW (ref 3.5–5.0)
Anion gap: 10 (ref 5–15)
BUN: 12 mg/dL (ref 8–23)
CO2: 26 mmol/L (ref 22–32)
Calcium: 8.8 mg/dL — ABNORMAL LOW (ref 8.9–10.3)
Chloride: 100 mmol/L (ref 98–111)
Creatinine, Ser: 2.7 mg/dL — ABNORMAL HIGH (ref 0.61–1.24)
GFR, Estimated: 26 mL/min — ABNORMAL LOW (ref >60)
Glucose, Bld: 95 mg/dL (ref 70–99)
Phosphorus: 3.4 mg/dL (ref 2.5–4.6)
Potassium: 3.2 mmol/L — ABNORMAL LOW (ref 3.5–5.1)
Sodium: 136 mmol/L (ref 135–145)

## 2021-10-20 LAB — LACTATE DEHYDROGENASE: LDH: 192 U/L (ref 98–192)

## 2021-10-20 LAB — BASIC METABOLIC PANEL
Anion gap: 12 (ref 5–15)
BUN: 29 mg/dL — ABNORMAL HIGH (ref 8–23)
CO2: 25 mmol/L (ref 22–32)
Calcium: 8.5 mg/dL — ABNORMAL LOW (ref 8.9–10.3)
Chloride: 97 mmol/L — ABNORMAL LOW (ref 98–111)
Creatinine, Ser: 4.56 mg/dL — ABNORMAL HIGH (ref 0.61–1.24)
GFR, Estimated: 13 mL/min — ABNORMAL LOW (ref >60)
Glucose, Bld: 103 mg/dL — ABNORMAL HIGH (ref 70–99)
Potassium: 4 mmol/L (ref 3.5–5.1)
Sodium: 134 mmol/L — ABNORMAL LOW (ref 135–145)

## 2021-10-20 LAB — PROTIME-INR
INR: 1.7 — ABNORMAL HIGH (ref 0.8–1.2)
Prothrombin Time: 20 seconds — ABNORMAL HIGH (ref 11.4–15.2)

## 2021-10-20 LAB — GLUCOSE, CAPILLARY
Glucose-Capillary: 93 mg/dL (ref 70–99)
Glucose-Capillary: 108 mg/dL — ABNORMAL HIGH (ref 70–99)
Glucose-Capillary: 105 mg/dL — ABNORMAL HIGH (ref 70–99)
Glucose-Capillary: 101 mg/dL — ABNORMAL HIGH (ref 70–99)

## 2021-10-20 LAB — MAGNESIUM: Magnesium: 2 mg/dL (ref 1.7–2.4)

## 2021-10-20 MED ORDER — HEPARIN SODIUM (PORCINE) 1000 UNIT/ML IJ SOLN
INTRAMUSCULAR | Status: AC
  Filled 2021-10-20: qty 4

## 2021-10-20 MED ORDER — ALBUMIN HUMAN 25 % IV SOLN
INTRAVENOUS | Status: AC
  Filled 2021-10-20: qty 100

## 2021-10-20 MED ORDER — ALBUMIN HUMAN 25 % IV SOLN
25.0000 g | Freq: Once | INTRAVENOUS | Status: AC
  Administered 2021-10-20: 25 g via INTRAVENOUS

## 2021-10-20 MED ORDER — WARFARIN SODIUM 3 MG PO TABS
3.0000 mg | ORAL_TABLET | Freq: Once | ORAL | Status: AC
  Administered 2021-10-20: 3 mg via ORAL
  Filled 2021-10-20: qty 1

## 2021-10-20 NOTE — Progress Notes (Signed)
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 Acute component either from cardiorenal +/- ATN with decreased renal function from hemodynamics and CPB + hypotensive episodes + pressors.  - CRRT 1/3-1/20, 1/23-1/25 then iHD starting 1/26. has tunneled HD cath in place.  Now has been HD dep for 3 + weeks-  the longer this goes the less likely a possibility for recovery- LVAD and HD is complicated but not impossible, have the renal navigator reaching out to the [facility] unit ( closest to him )  to see if they can accommodate an LVAD.  Pt has told Dr. Aundra Dubin that he worships on Saturday and is highly desirous of a MWF schedule so will run him tomorrow and pass that on the the navigator - Plan to continue HD MWF schedule for now   Cardiogenic shock: s/p LVAD 09/17/22 (HM 3).   - on  midodrine now, off NE and off milrinone -   UF as able with HD   CAD with h/o PCI last in 2013  Anemia of CKD: Hgb stable around 8-9. S/p IV iron, on ESA.  Afib- on amio  Acute hypoxic RF: s/p thoracentesis 1.3 L 10/02/21  Hyperkalemia: resolved with RRT  Bones-  phos Ok -  watch for binder need-   PTH 59  Dispo: tolerating HD better,  having renal navigator looking into him being able to dialyze at [facility] as OP with LVAD  Subjective:    Patient was seen on dialysis today.  Tolerating session relatively well.  Is having some hypotension so we will give albumin.  Denies any other complaints.    Objective:   BP (!) 78/66    Pulse 60    Temp (!) 97.3 F (36.3 C) (Oral)    Resp (!) 21    Ht 5\' 9"  (1.753 m)    Wt 72.3 kg    SpO2 95%    BMI 23.54 kg/m   Intake/Output Summary (Last 24 hours) at 10/20/2021 1003 Last data filed at 10/20/2021 0448 Gross per 24 hour  Intake 120 ml  Output 125 ml  Net -5 ml   Weight change: 3.1 kg  Physical Exam: GEN: NAD, alert, pleasant  HEENT: wearing glasses, no nasal discharge,  moist mucous membranes LUNGS: Bilateral chest rise with no increased work of breathing CV: LVAD, no audible murmur EXT: minimal edema, much improved, legs wrapped at tibial level ACCESS: rt IJ TC c/d/I-     Imaging: No results found.  Labs: BMET Recent Labs  Lab 10/14/21 1934 10/15/21 0356 10/16/21 0425 10/16/21 1621 10/17/21 0436 10/18/21 0440 10/19/21 0337 10/20/21 0458  NA 136 132* 135 132* 131* 136 136   137 134*  K 4.3 4.3 4.2 3.9 4.0 4.5 3.9   3.9 4.0  CL 102 99 102 94* 97* 99 99   100 97*  CO2 25 25 24 27 26 24 26   26 25   GLUCOSE 133* 91 98 117* 113* 87 78   78 103*  BUN 26* 20 32* 13 19 31* 19   19 29*  CREATININE 2.53* 2.16* 3.41* 2.01* 2.81* 3.93* 3.23*   3.22* 4.56*  CALCIUM 8.2* 8.3* 8.8* 8.4* 8.2* 8.6* 8.5*   8.6* 8.5*  PHOS 2.9 2.8 4.0 2.6 3.5 4.9* 4.6  --    CBC Recent Labs  Lab 10/17/21 0435 10/18/21 0440 10/19/21 0337 10/20/21 0458  WBC 9.7  10.4 9.3 8.8  HGB 8.4* 8.8* 9.0* 8.6*  HCT 26.9* 28.2* 28.7* 27.2*  MCV 94.4 94.3 95.3 93.2  PLT 617* 604* 590* 523*    Medications:     amiodarone  200 mg Oral Daily   Chlorhexidine Gluconate Cloth  6 each Topical Q0600   Chlorhexidine Gluconate Cloth  6 each Topical Q0600   darbepoetin (ARANESP) injection - NON-DIALYSIS  150 mcg Subcutaneous Q Mon-1800   docusate sodium  100 mg Oral BID   feeding supplement  237 mL Oral TID BM   mouth rinse  15 mL Mouth Rinse BID   mirtazapine  7.5 mg Oral QHS   montelukast  5 mg Oral QHS   multivitamin  1 tablet Oral QHS   pantoprazole  40 mg Oral Daily   rosuvastatin  10 mg Oral Daily   senna  2 tablet Oral QHS   sildenafil  20 mg Oral TID   sodium chloride flush  10-40 mL Intracatheter Q12H   sodium chloride flush  3 mL Intravenous Q12H   warfarin  3 mg Oral ONCE-1600   Warfarin - Pharmacist Dosing Inpatient   Does not apply [facility]

## 2021-10-20 NOTE — Progress Notes (Signed)
[facility] for Warfarin Indication: LVAD/AF  Allergies  Allergen Reactions   Mushroom Extract Complex Nausea And Vomiting   Neosporin [Neomycin-Bacitracin Zn-Polymyx] Hives    Patient Measurements: Height: 5\' 9"  (175.3 cm) Weight: 72.3 kg (159 lb 6.3 oz) IBW/kg (Calculated) : 70.7 Heparin Dosing Weight: 83.7 kg  Vital Signs: Temp: 98.2 F (36.8 C) (01/30 0429) Temp Source: Oral (01/30 0429) BP: 91/76 (01/30 0429) Pulse Rate: 30 (01/30 0429)  Labs: Recent Labs    10/18/21 0440 10/19/21 0337 10/20/21 0458  HGB 8.8* 9.0* 8.6*  HCT 28.2* 28.7* 27.2*  PLT 604* 590* 523*  LABPROT 19.9* 19.3* 20.0*  INR 1.7* 1.6* 1.7*  CREATININE 3.93* 3.23*   3.22* 4.56*     Estimated Creatinine Clearance: 15.3 mL/min (A) (by C-G formula based on SCr of 4.56 mg/dL (H)).   Medical History: Past Medical History:  Diagnosis Date   Arrhythmia    atrial fibrillation   CHF (congestive heart failure) (HCC)    Chronic kidney disease    Coronary artery disease    Hyperlipidemia    Hypertension    Myocardial infarct [facility])     Assessment: 71 yo male on chronic Eliquis PTA for afib.  Now s/p LVAD placement 09/17/21, pharmacy asked to begin anticoagulation with warfarin.   Patient previously therapeutic on lower doses 1-2mg  daily prior to tube feed being started for poor nutritional intake.   INR now trending up patient was very sensitive to doses >4 in the past so I hesitate to boost him too much. Will scale back his warfarin slightly so as to not overshoot. CBC and LDH stable. No signs or symptoms of bleeding.  Amiodarone dose reduced to 200mg  daily on 1/29.  Goal of Therapy:  INR 2-2.5 Monitor platelets by anticoagulation protocol: Yes   Plan:  -Warfarin 3 mg PO x1 today  -Daily INR, CBC, LDH  Erin Hearing PharmD., BCPS Clinical Pharmacist 10/20/2021 7:33 AM

## 2021-10-20 NOTE — Plan of Care (Signed)
°  Problem: Health Behavior/Discharge Planning: Goal: Ability to manage health-related needs will improve Outcome: Progressing   Problem: Clinical Measurements: Goal: Ability to maintain clinical measurements within normal limits will improve Outcome: Progressing Goal: Will remain free from infection Outcome: Progressing Goal: Diagnostic test results will improve Outcome: Progressing Goal: Respiratory complications will improve Outcome: Progressing Goal: Cardiovascular complication will be avoided Outcome: Progressing   Problem: Activity: Goal: Risk for activity intolerance will decrease Outcome: Progressing   Problem: Nutrition: Goal: Adequate nutrition will be maintained Outcome: Progressing   Problem: Coping: Goal: Level of anxiety will decrease Outcome: Progressing   Problem: Elimination: Goal: Will not experience complications related to bowel motility Outcome: Progressing Goal: Will not experience complications related to urinary retention Outcome: Progressing   Problem: Pain Managment: Goal: General experience of comfort will improve Outcome: Progressing   Problem: Safety: Goal: Ability to remain free from injury will improve Outcome: Progressing   Problem: Skin Integrity: Goal: Risk for impaired skin integrity will decrease Outcome: Progressing   Problem: Education: Goal: Ability to demonstrate management of disease process will improve Outcome: Progressing Goal: Ability to verbalize understanding of medication therapies will improve Outcome: Progressing Goal: Individualized Educational Video(s) Outcome: Progressing   Problem: Activity: Goal: Capacity to carry out activities will improve Outcome: Progressing   Problem: Cardiac: Goal: Ability to achieve and maintain adequate cardiopulmonary perfusion will improve Outcome: Progressing   Problem: Education: Goal: Knowledge of the prescribed therapeutic regimen will improve Outcome: Progressing    Problem: Activity: Goal: Risk for activity intolerance will decrease Outcome: Progressing   Problem: Cardiac: Goal: Ability to maintain an adequate cardiac output will improve Outcome: Progressing   Problem: Coping: Goal: Level of anxiety will decrease Outcome: Progressing   Problem: Fluid Volume: Goal: Risk for excess fluid volume will decrease Outcome: Progressing   Problem: Clinical Measurements: Goal: Ability to maintain clinical measurements within normal limits will improve Outcome: Progressing Goal: Will remain free from infection Outcome: Progressing   Problem: Respiratory: Goal: Will regain and/or maintain adequate ventilation Outcome: Progressing   Problem: Education: Goal: Knowledge of cardiac device and self-care will improve Outcome: Progressing Goal: Ability to safely manage health related needs after discharge will improve Outcome: Progressing Goal: Individualized Educational Video(s) Outcome: Progressing   Problem: Cardiac: Goal: Ability to achieve and maintain adequate cardiopulmonary perfusion will improve Outcome: Progressing

## 2021-10-20 NOTE — Progress Notes (Signed)
Contacted by renal MD on Friday regarding possible option of pt being accepted at [facility] with LVAD. Received response from clinic manager this am that all area clinics should be capable of accepting a pt with a LVAD. Clinics must have proper LVAD education prior to pt starting out-pt HD. Noted that pt prefers a MWF appt if possible. Will discuss case with MD today.   Melven Sartorius Renal Navigator [phone]

## 2021-10-20 NOTE — Plan of Care (Signed)
°  Problem: Acute Rehab PT Goals(only PT should resolve) Goal: Patient Will Transfer Sit To/From Stand Outcome: Progressing Goal: Pt Will Ambulate Outcome: Progressing Goal: Pt Will Go Up/Down Stairs Outcome: Progressing   Problem: Acute Rehab PT Goals(only PT should resolve) Goal: PT Additional Goal #1 Outcome: Progressing

## 2021-10-20 NOTE — Progress Notes (Signed)
PT Cancellation Note  Patient Details Name: Isaac [name] MRN: [id number] DOB: 1951/03/11   Cancelled Treatment:    Reason Eval/Treat Not Completed: Patient at procedure or test/unavailable Pt off floor at HD. Will follow up as time allows.   Marguarite Arbour A Ambrosia Wisnewski 10/20/2021, 9:46 AM  Marisa Severin, PT, DPT Acute Rehabilitation Services Pager [phone] Office [phone]

## 2021-10-20 NOTE — Progress Notes (Signed)
OT Cancellation Note  Patient Details Name: Isaac [name] MRN: [id number] DOB: Jul 21, 1951   Cancelled Treatment:    Reason Eval/Treat Not Completed: Patient at procedure or test/ unavailable (HD. Will return as schedule allows. Thank you.)  [place], OTR/L Acute Rehab Pager: [phone] Office: [phone] 10/20/2021, 8:40 AM

## 2021-10-20 NOTE — Progress Notes (Addendum)
Physical Therapy Treatment Patient Details Name: Edrian Melucci MRN: [id number] DOB: 1950-11-19 Today's Date: 10/20/2021   History of Present Illness Pt is a 71 y.o. male admitted 09/07/21 with near-syncope, nausea, poor oral intake, generalized weakness. Workup for advanced heart failure and persistent inotropic with requirement, cardiogenic shock. [place] 12/19. Impella placed on 12/23. LVAD placed 12/28. Pt with post-op delirium and agitation. CRRT initiated 1/3. S/p thoracentesis 1/12. S/p cortrak placement 1/13. CRRT stopped 1/20.  Pt back on CRRT on 1/23 due to staffing of HD. PMH includes advanced CHF (LVEF <20%), AICD/PPM, PAF on Eliquis, CKD 3, HTN, HLD.    PT Comments    Patient progressing well towards PT goals. Session focused on gait training and family education re: managing LVAD and changing from power to/from batteries etc. Pt with dizziness during today's session due to hypotension and having HD earlier today. Discharge recommendation updated to home with HHPT due to improvement in mobility. Only requires min cues to change himself over and manage LVAD equipment. Cues to adhere to sternal precautions during functional mobility; continues to require Min A for standing. Will plan to negotiate stairs prior to d/c. Will follow.    Recommendations for follow up therapy are one component of a multi-disciplinary discharge planning process, led by the attending physician.  Recommendations may be updated based on patient status, additional functional criteria and insurance authorization.  Follow Up Recommendations  Home health PT     Assistance Recommended at Discharge Frequent or constant Supervision/Assistance  Patient can return home with the following A little help with walking and/or transfers;Help with stairs or ramp for entrance;A little help with bathing/dressing/bathroom;Assistance with cooking/housework;Assist for transportation   Equipment Recommendations  Rollator (4  wheels);BSC/3in1    Recommendations for Other Services       Precautions / Restrictions Precautions Precautions: Fall;Sternal;Other (comment) Precaution Comments: LVAD Restrictions Weight Bearing Restrictions: Yes Other Position/Activity Restrictions: Sternal Precautions     Mobility  Bed Mobility Overal bed mobility: Needs Assistance Bed Mobility: Supine to Sit     Supine to sit: Supervision, HOB elevated     General bed mobility comments: Supervision for safety.    Transfers Overall transfer level: Needs assistance Equipment used: Rolling walker (2 wheels) Transfers: Sit to/from Stand Sit to Stand: Min assist           General transfer comment: Min A to power to standing with pt attempting to pull up on RW, stood from EOB x1, from chair x1.  + dizziness initially with standing.    Ambulation/Gait Ambulation/Gait assistance: Min assist Gait Distance (Feet): 250 Feet (+ 200) Assistive device: Rolling walker (2 wheels) Gait Pattern/deviations: Step-through pattern, Decreased stride length Gait velocity: decr Gait velocity interpretation: 1.31 - 2.62 ft/sec, indicative of limited community ambulator   General Gait Details: Slow, mildly unsteady gait with RW coming off floor esp with turns needing Min A at times, + dizziness. 1 seated rest break needed. Not forth coming with dizzy symptoms.   Stairs             Wheelchair Mobility    Modified Rankin (Stroke Patients Only)       Balance Overall balance assessment: Needs assistance Sitting-balance support: No upper extremity supported, Feet supported Sitting balance-Leahy Scale: Good     Standing balance support: During functional activity Standing balance-Leahy Scale: Fair Standing balance comment: ABle to stand statically wtihout UE support but needs UE support for walking esp due to dizziness.  Cognition Arousal/Alertness: Awake/alert Behavior During  Therapy: Flat affect Overall Cognitive Status: Impaired/Different from baseline Area of Impairment: Memory, Problem solving                     Memory: Decreased recall of precautions       Problem Solving: Requires verbal cues General Comments: Requires cues to adhere to sternal precautions during mobility; able to problem solve how to change over from power to batteries with Min cues.        Exercises      General Comments General comments (skin integrity, edema, etc.): Family present; requires min cues to change power to/from battery, family education performed as well. Reviewed all parts of LVAD, black bag etc.      Pertinent Vitals/Pain Pain Assessment Pain Assessment: No/denies pain    Home Living                          Prior Function            PT Goals (current goals can now be found in the care plan section) Acute Rehab PT Goals Patient Stated Goal: to go home PT Goal Formulation: With patient Time For Goal Achievement: 11/03/21 Potential to Achieve Goals: Good Progress towards PT goals: Progressing toward goals    Frequency    Min 3X/week      PT Plan Discharge plan needs to be updated    Co-evaluation              AM-PAC PT "6 Clicks" Mobility   Outcome Measure  Help needed turning from your back to your side while in a flat bed without using bedrails?: A Little Help needed moving from lying on your back to sitting on the side of a flat bed without using bedrails?: A Little Help needed moving to and from a bed to a chair (including a wheelchair)?: A Little Help needed standing up from a chair using your arms (e.g., wheelchair or bedside chair)?: A Little Help needed to walk in hospital room?: A Little Help needed climbing 3-5 steps with a railing? : A Little 6 Click Score: 18    End of Session   Activity Tolerance: Patient tolerated treatment well;Treatment limited secondary to medical complications (Comment)  (dizziness) Patient left: in bed;with call bell/phone within reach;with nursing/sitter in room;with family/visitor present Nurse Communication: Mobility status PT Visit Diagnosis: Unsteadiness on feet (R26.81);Other abnormalities of gait and mobility (R26.89);Muscle weakness (generalized) (M62.81)     Time: 7035-0093 PT Time Calculation (min) (ACUTE ONLY): 30 min  Charges:  $Gait Training: 8-22 mins $Therapeutic Activity: 8-22 mins                     Marisa Severin, PT, DPT Acute Rehabilitation Services Pager [phone] Office [place] 10/20/2021, 4:15 PM

## 2021-10-20 NOTE — Care Management Important Message (Signed)
Important Message  Patient Details  Name: Isaac [name] MRN: [id number] Date of Birth: 05-14-1951   Medicare Important Message Given:  Yes     Maritsa Hunsucker 10/20/2021, 1:49 PM

## 2021-10-20 NOTE — Progress Notes (Signed)
Requested to see pt for out-pt HD needs. Met with pt and pt's family member at bedside. Explained role and process for new clinic placement. Discussed clinic options and pt prefers [facility] so he can stay with [facility] provider. Pt prefers MWF 2nd shift for religious reasons (pt worships on Saturday). Referral submitted to [facility] admissions this afternoon and advised intake of pt's need for LVAD at d/c. Pt's family will provide transportation to HD appts at d/c. Navigator will contact LVAD team once pt's clinic acceptance is known. Will follow and assist.  Melven Sartorius Renal Navigator [phone]

## 2021-10-20 NOTE — Procedures (Signed)
I was present at this dialysis session. I have reviewed the session itself and made appropriate changes.   Some hypotension.  25 g of 25% albumin ordered.  Filed Weights   10/19/21 0500 10/20/21 0500 10/20/21 0818  Weight: 66.9 kg 72.3 kg 72.3 kg    Recent Labs  Lab 10/19/21 0337 10/20/21 0458  NA 136   137 134*  K 3.9   3.9 4.0  CL 99   100 97*  CO2 26   26 25   GLUCOSE 78   78 103*  BUN 19   19 29*  CREATININE 3.23*   3.22* 4.56*  CALCIUM 8.5*   8.6* 8.5*  PHOS 4.6  --     Recent Labs  Lab 10/18/21 0440 10/19/21 0337 10/20/21 0458  WBC 10.4 9.3 8.8  HGB 8.8* 9.0* 8.6*  HCT 28.2* 28.7* 27.2*  MCV 94.3 95.3 93.2  PLT 604* 590* 523*    Scheduled Meds:  amiodarone  200 mg Oral Daily   Chlorhexidine Gluconate Cloth  6 each Topical Q0600   Chlorhexidine Gluconate Cloth  6 each Topical Q0600   darbepoetin (ARANESP) injection - NON-DIALYSIS  150 mcg Subcutaneous Q Mon-1800   docusate sodium  100 mg Oral BID   feeding supplement  237 mL Oral TID BM   mouth rinse  15 mL Mouth Rinse BID   mirtazapine  7.5 mg Oral QHS   montelukast  5 mg Oral QHS   multivitamin  1 tablet Oral QHS   pantoprazole  40 mg Oral Daily   rosuvastatin  10 mg Oral Daily   senna  2 tablet Oral QHS   sildenafil  20 mg Oral TID   sodium chloride flush  10-40 mL Intracatheter Q12H   sodium chloride flush  3 mL Intravenous Q12H   warfarin  3 mg Oral ONCE-1600   Warfarin - Pharmacist Dosing Inpatient   Does not apply q1600   Continuous Infusions:  sodium chloride Stopped (09/18/21 0803)   sodium chloride Stopped (10/02/21 0900)   sodium chloride     sodium chloride     promethazine (PHENERGAN) injection (IM or IVPB) Stopped (10/10/21 1755)   PRN Meds:.sodium chloride, sodium chloride, sodium chloride, acetaminophen, albuterol, alteplase, Gerhardt's butt cream, heparin, heparin, lidocaine (PF), lidocaine-prilocaine, lip balm, ondansetron (ZOFRAN) IV, oxyCODONE, pentafluoroprop-tetrafluoroeth,  polyethylene glycol, promethazine (PHENERGAN) injection (IM or IVPB), sodium chloride flush, sodium chloride flush, traMADol   Santiago Bumpers,  MD 10/20/2021, 10:08 AM

## 2021-10-20 NOTE — Progress Notes (Addendum)
Patient ID: Isaac [name], male   DOB: 05-24-1951, 71 y.o.   MRN: [id number]    Advanced Heart Failure VAD Team Note  PCP-Cardiologist: None   Subjective:    12/19 RHC- RA 7, PA 39/14 (25), PCWP 11, CO 6.4 CI 3. Thermo 3.5 1.8. Lasix drip stopped after cath. 12/20 Swan removed.  Norepi 3 mcg added.  12/21 Fever. Blood CX drawn. UA + leukocytes. Started on vanc and cefepime. Diuresed with IV lasix + metolazone.  12/23 Impella 5.5 placed 12/28 HM III LVAD, ICD leads plastered to tricuspid valve with severe TR, valve not replaced and some improvement in TR with LVAD placement.  12/29 Extubated 01/03 Started CRRT 01/07  L thoracentesis and placement of [facility] 01/09 LVAD speed increased to 5600 rpm 01/10 Milrinone stopped. Amio gtt>>PO. Completed 5 day course Cefepime.  01/12 Ramp echo done: Moderate lateral pericardial effusion w/o tamponade, RV larger with severe dilation/moderate dysfunction and left shift of the septum, LV small and undershifted.  TR severe.  LVAD speed decreased to 5000 rpms at 2 different sessions.  Also had thoracentesis on left with 1.3 L off.  01/13 TFs begun 01/16 echo reviewed: EF 25-30%, LV larger than prior but septum still with left shift, RV moderately dilated and moderately dysfunctional, moderate-severe TR, mod-large localized pericardial effusion lateral to the LV.  1/18 Ramp Echo with speed decreased to 4900  1/21 CVVHD stopped 1/23 delay in iHD due to staffing, developed increased dyspnea + volume overload CVP >20 and low flow alarms. VAD speed reduced to 4800 then increased back to 5000. CVVH restarted. Milrinone restarted 0.25.  1/25 CVVH stopped 1/26 Tolerated initial iHD.  1/27 Echo done, speed increased to 5200 rpm. Stopped milrinone.  1/29 Midodrine stopped. Sildenafil 20 tid started  Co-ox 57.5% . Made 125 cc urine.   INR 1.7 today, hgb 8.6.  LDH 192 .   Denies SOB. No complaints.   LVAD INTERROGATION:  HeartMate III LVAD:   Flow  3.8liters/min, speed 5200, power 4 PI  4.8  21 PI events.  VAD interrogated personally. Parameters stable.     Objective:    Vital Signs:   Temp:  [97.8 F (36.6 C)-98.5 F (36.9 C)] 98.2 F (36.8 C) (01/30 0429) Pulse Rate:  [30-82] 30 (01/30 0429) Resp:  [14-19] 19 (01/30 0429) BP: (90-103)/(65-92) 91/76 (01/30 0429) SpO2:  [92 %-95 %] 92 % (01/30 0429) Weight:  [72.3 kg] 72.3 kg (01/30 0500) Last BM Date: 10/19/21 Mean arterial Pressure 80s    Intake/Output:   Intake/Output Summary (Last 24 hours) at 10/20/2021 0744 Last data filed at 10/20/2021 0448 Gross per 24 hour  Intake 120 ml  Output 125 ml  Net -5 ml     Physical Exam  CVP 11-12 personally checked.  Physical Exam: GENERAL: No acute distress.Sitting on the side of the bed.  HEENT: normal  NECK: Supple, JVP  9-10 .  2+ bilaterally, no bruits.  No lymphadenopathy or thyromegaly appreciated.   CARDIAC:  Mechanical heart sounds with LVAD hum present.  LUNGS:  Decreased RLL on room air.  ABDOMEN:  Soft, round, nontender, positive bowel sounds x4.     LVAD exit site:  Dressing dry and intact.  No erythema or drainage.  Stabilization device present and accurately applied.  EXTREMITIES:  Warm and dry, no cyanosis, clubbing, rash or edema  NEUROLOGIC:  Alert and oriented x 3.    No aphasia.  No dysarthria.  Affect pleasant.      Telemetry  A paced 60s   Labs   Basic Metabolic Panel: Recent Labs  Lab 10/16/21 0425 10/16/21 1621 10/17/21 0435 10/17/21 0436 10/18/21 0440 10/19/21 0337 10/20/21 0458  NA 135 132*  --  131* 136 136   137 134*  K 4.2 3.9  --  4.0 4.5 3.9   3.9 4.0  CL 102 94*  --  97* 99 99   100 97*  CO2 24 27  --  26 24 26   26 25   GLUCOSE 98 117*  --  113* 87 78   78 103*  BUN 32* 13  --  19 31* 19   19 29*  CREATININE 3.41* 2.01*  --  2.81* 3.93* 3.23*   3.22* 4.56*  CALCIUM 8.8* 8.4*  --  8.2* 8.6* 8.5*   8.6* 8.5*  MG 2.4  --  2.0  --  2.1 1.9 2.0  PHOS 4.0 2.6  --  3.5 4.9* 4.6  --      Liver Function Tests: Recent Labs  Lab 10/16/21 0425 10/16/21 1621 10/17/21 0436 10/18/21 0440 10/19/21 0337  ALBUMIN 2.1* 2.2* 2.0* 2.0* 2.1*   No results for input(s): LIPASE, AMYLASE in the last 168 hours. No results for input(s): AMMONIA in the last 168 hours.   CBC: Recent Labs  Lab 10/16/21 1621 10/17/21 0435 10/18/21 0440 10/19/21 0337 10/20/21 0458  WBC 8.6 9.7 10.4 9.3 8.8  HGB 9.0* 8.4* 8.8* 9.0* 8.6*  HCT 28.7* 26.9* 28.2* 28.7* 27.2*  MCV 93.5 94.4 94.3 95.3 93.2  PLT 706* 617* 604* 590* 523*    INR: Recent Labs  Lab 10/16/21 0425 10/17/21 0435 10/18/21 0440 10/19/21 0337 10/20/21 0458  INR 1.7* 1.8* 1.7* 1.6* 1.7*    Other results:   Imaging   No results found.   Medications:     Scheduled Medications:  amiodarone  200 mg Oral Daily   Chlorhexidine Gluconate Cloth  6 each Topical Q0600   Chlorhexidine Gluconate Cloth  6 each Topical Q0600   darbepoetin (ARANESP) injection - NON-DIALYSIS  150 mcg Subcutaneous Q Mon-1800   docusate sodium  100 mg Oral BID   feeding supplement  237 mL Oral TID BM   mouth rinse  15 mL Mouth Rinse BID   mirtazapine  7.5 mg Oral QHS   montelukast  5 mg Oral QHS   multivitamin  1 tablet Oral QHS   pantoprazole  40 mg Oral Daily   rosuvastatin  10 mg Oral Daily   senna  2 tablet Oral QHS   sildenafil  20 mg Oral TID   sodium chloride flush  10-40 mL Intracatheter Q12H   sodium chloride flush  3 mL Intravenous Q12H   Warfarin - Pharmacist Dosing Inpatient   Does not apply q1600    Infusions:  sodium chloride Stopped (09/18/21 0803)   sodium chloride Stopped (10/02/21 0900)   sodium chloride     sodium chloride     promethazine (PHENERGAN) injection (IM or IVPB) Stopped (10/10/21 1755)    PRN Medications: sodium chloride, sodium chloride, sodium chloride, acetaminophen, albuterol, alteplase, Gerhardt's butt cream, heparin, heparin, lidocaine (PF), lidocaine-prilocaine, lip balm, ondansetron  (ZOFRAN) IV, oxyCODONE, pentafluoroprop-tetrafluoroeth, polyethylene glycol, promethazine (PHENERGAN) injection (IM or IVPB), sodium chloride flush, sodium chloride flush, traMADol   Assessment/Plan:    1. Acute on chronic systolic CHF:  Long-standing cardiomyopathy.  [place].  Echo this admission with EF < 20%, severe LV dilation, restrictive diastolic function, moderate RV dysfunction, moderate MR, mod-severe TR.  Cause of cardiomyopathy is uncertain.  He has a history of CAD, but I do not think that the described CAD from the past could explain his cardiomyopathy, but CAD could have progressed.  With difficulty tolerating GDMT/need for midodrine and cardiorenal syndrome as well as profound volume overload + NYHA class IV symptoms,  concerned for low output HF. Co-ox off milrinone was 36%, milrinone started and increased to 0.375 but CO remained low. NE added and Impella 5.5 placed 12/23. EF 10% on TEE 12/23. s/p HM III VAD on 12/28.  Ramp echo 01/03 with EF 20-25%, RV moderately reduced, septum mid-line.  Ramp echo again on 1/12 showed severely dilated/moderately dysfunctional RV with severe TR and left-shifted septum with small LV.  Speed decreased on 1/12 to 5000 rpm over 2 sessions due to RV failure and milrinone 0.25 + NE 3 restarted.  Echo repeated on 1/16, RV looks better and LV larger but still with left shift of the septum.  Able to increase CVVH, weight down significantly.  Echo again on 1/18, speed decreased to 4900 with RV mildly dysfunctional and severely enlarged with severe TR.  CVVHD stopped 1/20.  1/23 delay in iHD due to staffing, developed increased dyspnea + volume overload CVP >20 and low flow alarms. VAD speed reduced to 4800. CVVH restarted. Milrinone restarted 0.25. Speed increased back to 5000 rpm and then to 5200.  Volume much improved. CVVH stopped 1/25 and had iHD 1/26 and 1/28.  - CO-OX stable off milrinone. Maps stable off midodrine.  - Next HD planned for today.    - Continue  sildenafil 20 tid for RV.   - Warfarin for INR 2-2.5.  INR 1.7 today Discussed dosing with PharmD personally.  - Continue 81 mg aspirin - Can remove unna boots and place ted hose. Continue UNNA boots 2. Tricuspid regurgitation:  Tricuspid repair not done at time of VAD d/t proximity of ICD wires and hypotension during surgery.  He has severe TR.  3. Atrial fibrillation: IN SR - Continue amiodarone to 200 mg daily . 4. AKI on CKD stage 3: Gradual rise (was about 1.7 pre-op) since surgery.  He has not been hypotensive post-op, suspect intra-op hypotension led to development of ATN => urine sediment looked like ATN per renal. Got CVVH. TDC placed 1/7. Now off CVVH and now with iHD, tolerating so far.  -  Appreciate Nephrology input. Made 125 cc urine output.  - Plan to iHD today. M-W -F  5. CAD: History of PCI to OM1 in 2007 and RCA in 2013.  No CP or ACs.  - Deferred cardiac cath due to AKI and plan for VAD - Continue Crestor.  6. ID: Had fever 12/21, PCT 1.03.  Blood cultures negative. ?Phlebitis at IV site. Post-op fever with elevated WBCs, PCT 2.63 -> 1.78.  He completed course of cefepime.  - Resolved.  7. Constipation: Resolved.  8. Hyponatremia: Hypervolemic hyponatremia.  Controlled via dialysis.  9. Anemia: 1 unit PRBCs on 1/12,  1 unit PRBs 1/13, 1 unit 1/16. No overt bleeding, ?losses via CVVH.  Hgb 8.6  today, no evidence bleeding - Transfuse for Hgb <7.5  - Received IV iron x 5 doses per nephrology (finished 1/20) 10. Confusion/delirium: Initially post-op and started on Precedex.  Resolved.  11. F/E/N: TFs via [place] for poor nutritional status, now off.  Improving po intake.  - Added remeron to help with appetite.  12. Left pleural effusion: - s/p thoracentesis 1/7 - s/p thoracentesis 1/12. 13. Pericardial  effusion: Stable moderate pericardial effusion, primarily localized to along the lateral wall of the LV.  Does not appear to be affecting the RV/doubt  tamponade.  Discussed after 1/12 and 1/18 echo with Dr. Cyndia Bent, conservative management for now.  14. Right great toe pain: - ? Possible gout.  15. Hyperkalemia - Resolved w/ HD and lokelma.  16. Deconditioning:  - continue to ambulate - OT recommending CIR.  - Rehab admission coordinator following.   Amy Clegg NP-C  10/20/2021 7:44 AM  Patient seen with NP, agree with the above note.   Doing well, seen on HD today.  Co-ox 58% today.  MAP stable. Hgb stable at 8.6.   General: Well appearing this am. NAD.  HEENT: Normal. Neck: Supple, JVP 7-8 cm. Carotids OK.  Cardiac:  Mechanical heart sounds with LVAD hum present.  Lungs:  CTAB, normal effort.  Abdomen:  NT, ND, no HSM. No bruits or masses. +BS  LVAD exit site: Well-healed and incorporated. Dressing dry and intact. No erythema or drainage. Stabilization device present and accurately applied. Driveline dressing changed daily per sterile technique. Extremities:  Warm and dry. No cyanosis, clubbing, rash, or edema.  Neuro:  Alert & oriented x 3. Cranial nerves grossly intact. Moves all 4 extremities w/o difficulty. Affect pleasant    Patient progressing, walking in hall.  Will need to reassess need for CIR.   Co-ox stable off milrinone.  He is off midodrine and tolerating sildenafil.   I think he is going to need ongoing HD, needs M-W-F, will need renal/LVAD coordinators to check in with the dialysis center in [place] to see if he can go there.    INR 1.7, adjust warfarin.   Loralie Champagne 10/20/2021 9:38 AM

## 2021-10-20 NOTE — Progress Notes (Signed)
LVAD Coordinator Rounding Note:  Admitted 09/06/21 due to CHF exacerbation to Dr Claris Gladden service. He was started on Milrinone on admission. Febrile 09/10/21- suspected to have UTI- antibiotics started. Impella 5.5 placed 09/12/21.   HM III LVAD implanted on 09/17/21 by Dr Cyndia Bent under destination therapy criteria. 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   Pt laying in bed. He walked the unit with PT this afternoon. Denies complaints.  Pt had HD this morning. He will be on a M-W-F schedule. Discussed with pt and wife need for placement at HD center prior to discharge.  Spoke with Melven Sartorius (renal navigator) today regarding out-pt HD placement. Provided her with VAD office and pager number, and asked to call us with placement information once arranged. VAD coordinators will reach out to placement center to arrange VAD education.   Plan for VAD discharge education with pt and his wife 10/21/21 at 0930.   Joey with [employer] at bedside to turn device therapies on.   Vital signs: Temp: 98.3 HR: 60 paced Doppler Pressure: 84 Auto Cuff BP: not documented O2 Sat: 95% on RA Wt: 185.2>196.8.Marland Kitchen..154.3>155.4>163.8>155.4>150.7>154.1>152>159.4> lbs    LVAD interrogation reveals:  Speed: 5200 Flow: 4.4 Power: 3.7w PI: 3.8 Hematocrit: 20-- DO NOT ADJUST  Alarms: none Events: 50 PI events so far today  Fixed speed: 5200 Low speed limit: 4900  Drive Line: Existing VAD dressing C/D/I with anchor intact and accurately applied.  Existing VAD dressing removed and site care performed using sterile technique. Drive line exit site cleaned with Chlora prep applicators x 2, allowed to dry, rinsed with saline and gauze dressing with silver strip applied. Exit site not incorporated, the velour is fully implanted at exit site.  Scant amount of dried tissue fat necrosis drainage noted on silver strip. No redness,  tenderness, foul odor or rash noted. Drive line anchor replaced. Continue twice weekly dressing changes on Monday/Thursday per Nurse Davonna Belling, [place] coordinator, or trained caregiver. May be able to advance to weekly changes on Monday. Next dressing change due: 10/23/21.    Labs:  LDH trend: 344>379.............226>257>238>229>222>192  INR trend: 1.6>1.8>2.8>2.9>2.1.................2.6>2.2>1.7>1.8>1.7  Anticoagulation Plan: -INR Goal: 2.0 - 2.5 -ASA Dose: 81 mg daily   Blood Products:  Intra Op: 09/17/21  1 PLT  4 FFP   4 PRBC  Cellsaver- 803 cc Post Op:  - 09/17/21>>1 PRBC - 09/18/21>> 3 PRBCs - 09/19/21>> 1 PRBC -  09/26/21>> 1 PRBC - 10/02/21>>1 PRBC - 10/03/21>>1 PRBC - 10/06/21 >> 1 PRBC  Device: - [employer] dual - AV paced 60 -Therapies: ON  *Will need to be referred to device clinic at discharge*  Arrythmias: 3 episodes of VT that required defib while in OR 09/17/21. Currently on Amiodarone gtt. Remains in afib.   Respiratory: extubated 09/18/22  Nitric Oxide:  off 09/18/22  Renal:  09/23/21>>started CVVH 10/10/21>>stopped CVVH 10/13/21>> restarted CVVH 10/15/21>> stopped CVVH  10/16/21>>started HD (hemodialysis)  Infection:  09/10/21>> blood cultures >> No growth 5 days; final 09/12/21>> urine culture >> No growth; final  09/25/21>>blood cultures>>negative  09/27/21>>pleural effusion>>negative, final 10/02/21>> pleural effusion >> no growth; final   Drips:  Milrinone 0.125 mcg/kg/min - off 10/17/21  Patient Education: Reports he is able to change power sources independently Encouraged pt to continue reading HM III patient manual. He verbalized understanding.  Demonstrated and verbalized VAD dressing change for patient's wife. Plan for wife to perform dressing change tomorrow with VAD coordinator supervision. Will  plan for VAD discharge teaching tomorrow with pt's wife  Plan/Recommendations:  1. Page VAD coordinator for drive line or equipment  concerns 2. Twice a week dressing change per Nurse Davonna Belling or [facility] coordinator on Monday and Thursday.  Emerson Monte RN [place] Coordinator  Office: [phone]  24/7 Pager: [phone]

## 2021-10-21 DIAGNOSIS — Z95811 Presence of heart assist device: Secondary | ICD-10-CM | POA: Diagnosis not present

## 2021-10-21 LAB — RENAL FUNCTION PANEL
Albumin: 2.2 g/dL — ABNORMAL LOW (ref 3.5–5.0)
Anion gap: 10 (ref 5–15)
BUN: 20 mg/dL (ref 8–23)
CO2: 26 mmol/L (ref 22–32)
Calcium: 8.6 mg/dL — ABNORMAL LOW (ref 8.9–10.3)
Chloride: 100 mmol/L (ref 98–111)
Creatinine, Ser: 3.69 mg/dL — ABNORMAL HIGH (ref 0.61–1.24)
GFR, Estimated: 17 mL/min — ABNORMAL LOW (ref >60)
Glucose, Bld: 95 mg/dL (ref 70–99)
Phosphorus: 4.7 mg/dL — ABNORMAL HIGH (ref 2.5–4.6)
Potassium: 3.7 mmol/L (ref 3.5–5.1)
Sodium: 136 mmol/L (ref 135–145)

## 2021-10-21 LAB — GLUCOSE, CAPILLARY
Glucose-Capillary: 102 mg/dL — ABNORMAL HIGH (ref 70–99)
Glucose-Capillary: 111 mg/dL — ABNORMAL HIGH (ref 70–99)
Glucose-Capillary: 79 mg/dL (ref 70–99)
Glucose-Capillary: 97 mg/dL (ref 70–99)
Glucose-Capillary: 97 mg/dL (ref 70–99)

## 2021-10-21 LAB — LACTATE DEHYDROGENASE: LDH: 177 U/L (ref 98–192)

## 2021-10-21 LAB — CBC
HCT: 26 % — ABNORMAL LOW (ref 39.0–52.0)
Hemoglobin: 8 g/dL — ABNORMAL LOW (ref 13.0–17.0)
MCH: 29 pg (ref 26.0–34.0)
MCHC: 30.8 g/dL (ref 30.0–36.0)
MCV: 94.2 fL (ref 80.0–100.0)
Platelets: 440 10*3/uL — ABNORMAL HIGH (ref 150–400)
RBC: 2.76 MIL/uL — ABNORMAL LOW (ref 4.22–5.81)
RDW: 18.4 % — ABNORMAL HIGH (ref 11.5–15.5)
WBC: 7.6 10*3/uL (ref 4.0–10.5)
nRBC: 0 % (ref 0.0–0.2)

## 2021-10-21 LAB — COOXEMETRY PANEL
Carboxyhemoglobin: 1.9 % — ABNORMAL HIGH (ref 0.5–1.5)
Methemoglobin: 0.8 % (ref 0.0–1.5)
O2 Saturation: 58.5 %
Total hemoglobin: 7.9 g/dL — ABNORMAL LOW (ref 12.0–16.0)

## 2021-10-21 LAB — PROTIME-INR
INR: 1.9 — ABNORMAL HIGH (ref 0.8–1.2)
Prothrombin Time: 21.5 seconds — ABNORMAL HIGH (ref 11.4–15.2)

## 2021-10-21 LAB — MAGNESIUM: Magnesium: 1.8 mg/dL (ref 1.7–2.4)

## 2021-10-21 MED ORDER — MIDODRINE HCL 5 MG PO TABS
5.0000 mg | ORAL_TABLET | ORAL | Status: DC
  Administered 2021-10-22: 5 mg via ORAL
  Filled 2021-10-21: qty 1

## 2021-10-21 MED ORDER — WARFARIN SODIUM 2 MG PO TABS
2.0000 mg | ORAL_TABLET | Freq: Once | ORAL | Status: AC
  Administered 2021-10-21: 2 mg via ORAL
  Filled 2021-10-21: qty 1

## 2021-10-21 MED ORDER — KIDNEY FAILURE BOOK: Freq: Once | Status: AC

## 2021-10-21 NOTE — Progress Notes (Signed)
[facility] for Warfarin Indication: LVAD/AF  Allergies  Allergen Reactions   Mushroom Extract Complex Nausea And Vomiting   Neosporin [Neomycin-Bacitracin Zn-Polymyx] Hives    Patient Measurements: Height: 5\' 9"  (175.3 cm) Weight: 72.7 kg (160 lb 4.4 oz) IBW/kg (Calculated) : 70.7 Heparin Dosing Weight: 83.7 kg  Vital Signs: Temp: 98.3 F (36.8 C) (01/31 0357) Temp Source: Oral (01/31 0357) BP: 83/69 (01/31 0357) Pulse Rate: 63 (01/31 0640)  Labs: Recent Labs    10/19/21 0337 10/20/21 0458 10/20/21 1451 10/21/21 0405  HGB 9.0* 8.6*  --  8.0*  HCT 28.7* 27.2*  --  26.0*  PLT 590* 523*  --  440*  LABPROT 19.3* 20.0*  --  21.5*  INR 1.6* 1.7*  --  1.9*  CREATININE 3.23*   3.22* 4.56* 2.70* 3.69*     Estimated Creatinine Clearance: 18.9 mL/min (A) (by C-G formula based on SCr of 3.69 mg/dL (H)).   Medical History: Past Medical History:  Diagnosis Date   Arrhythmia    atrial fibrillation   CHF (congestive heart failure) (HCC)    Chronic kidney disease    Coronary artery disease    Hyperlipidemia    Hypertension    Myocardial infarct [facility])     Assessment: 71 yo male on chronic Eliquis PTA for afib.  Now s/p LVAD placement 09/17/21, pharmacy asked to begin anticoagulation with warfarin.   Patient previously therapeutic on lower doses 1-2mg  daily prior to tube feed being started for poor nutritional intake.   INR now trending up patient was very sensitive to doses >4 in the past. Given trend will scale back on dosing. CBC and LDH stable. No signs or symptoms of bleeding.  Expect discharge warfarin dose to be closer to 2mg  daily at discharge.   Amiodarone dose reduced to 200mg  daily on 1/29.  Goal of Therapy:  INR 2-2.5 Monitor platelets by anticoagulation protocol: Yes   Plan:  -Warfarin 2 mg PO x1 today  -Daily INR, CBC, LDH  Erin Hearing PharmD., BCPS Clinical Pharmacist 10/21/2021 7:53 AM

## 2021-10-21 NOTE — Progress Notes (Signed)
Pt unavailable for 1200 cbg

## 2021-10-21 NOTE — Progress Notes (Signed)
Patient ID: Isaac [name], male   DOB: 1950-12-30, 71 y.o.   MRN: [id number]    Advanced Heart Failure VAD Team Note  PCP-Cardiologist: None   Subjective:    12/19 RHC- RA 7, PA 39/14 (25), PCWP 11, CO 6.4 CI 3. Thermo 3.5 1.8. Lasix drip stopped after cath. 12/20 Swan removed.  Norepi 3 mcg added.  12/21 Fever. Blood CX drawn. UA + leukocytes. Started on vanc and cefepime. Diuresed with IV lasix + metolazone.  12/23 Impella 5.5 placed 12/28 HM III LVAD, ICD leads plastered to tricuspid valve with severe TR, valve not replaced and some improvement in TR with LVAD placement.  12/29 Extubated 01/03 Started CRRT 01/07  L thoracentesis and placement of [facility] 01/09 LVAD speed increased to 5600 rpm 01/10 Milrinone stopped. Amio gtt>>PO. Completed 5 day course Cefepime.  01/12 Ramp echo done: Moderate lateral pericardial effusion w/o tamponade, RV larger with severe dilation/moderate dysfunction and left shift of the septum, LV small and undershifted.  TR severe.  LVAD speed decreased to 5000 rpms at 2 different sessions.  Also had thoracentesis on left with 1.3 L off.  01/13 TFs begun 01/16 echo reviewed: EF 25-30%, LV larger than prior but septum still with left shift, RV moderately dilated and moderately dysfunctional, moderate-severe TR, mod-large localized pericardial effusion lateral to the LV.  1/18 Ramp Echo with speed decreased to 4900  1/21 CVVHD stopped 1/23 delay in iHD due to staffing, developed increased dyspnea + volume overload CVP >20 and low flow alarms. VAD speed reduced to 4800 then increased back to 5000. CVVH restarted. Milrinone restarted 0.25.  1/25 CVVH stopped 1/26 Tolerated initial iHD.  1/27 Echo done, speed increased to 5200 rpm. Stopped milrinone.  1/29 Midodrine stopped. Sildenafil 20 tid started  Co-ox 59%. Weight stable.  Had HD yesterday, transient hypotension and got albumin.   INR 1.9 today, hgb 8.  LDH 177.   Denies SOB, walking in halls. No  complaints.   LVAD INTERROGATION:  HeartMate III LVAD:   Flow 4.7 liters/min, speed 5200, power 3.6 PI 2.8.  No PI events overnight.  VAD interrogated personally. Parameters stable.     Objective:    Vital Signs:   Temp:  [97.4 F (36.3 C)-98.7 F (37.1 C)] 98.3 F (36.8 C) (01/31 0357) Pulse Rate:  [60-63] 63 (01/31 0640) Resp:  [16-27] 18 (01/31 0640) BP: (73-117)/(55-72) 85/72 (01/31 0821) SpO2:  [93 %-99 %] 93 % (01/31 0640) Weight:  [71.8 kg-72.7 kg] 72.7 kg (01/31 0640) Last BM Date: 10/19/21 Mean arterial Pressure 70s    Intake/Output:   Intake/Output Summary (Last 24 hours) at 10/21/2021 0844 Last data filed at 10/20/2021 1952 Gross per 24 hour  Intake 0 ml  Output 526 ml  Net -526 ml     Physical Exam   General: Well appearing this am. NAD.  HEENT: Normal. Neck: Supple, JVP 8-9 cm. Carotids OK.  Cardiac:  Mechanical heart sounds with LVAD hum present.  Lungs:  CTAB, normal effort.  Abdomen:  NT, ND, no HSM. No bruits or masses. +BS  LVAD exit site: Well-healed and incorporated. Dressing dry and intact. No erythema or drainage. Stabilization device present and accurately applied. Driveline dressing changed daily per sterile technique. Extremities:  Warm and dry. No cyanosis, clubbing, rash, or edema.  Neuro:  Alert & oriented x 3. Cranial nerves grossly intact. Moves all 4 extremities w/o difficulty. Affect pleasant     Telemetry    A paced 60s   Labs  Basic Metabolic Panel: Recent Labs  Lab 10/17/21 0435 10/17/21 0436 10/18/21 0440 10/19/21 0337 10/20/21 0458 10/20/21 1451 10/21/21 0405  NA  --  131* 136 136   137 134* 136 136  K  --  4.0 4.5 3.9   3.9 4.0 3.2* 3.7  CL  --  97* 99 99   100 97* 100 100  CO2  --  26 24 26   26 25 26 26   GLUCOSE  --  113* 87 78   78 103* 95 95  BUN  --  19 31* 19   19 29* 12 20  CREATININE  --  2.81* 3.93* 3.23*   3.22* 4.56* 2.70* 3.69*  CALCIUM  --  8.2* 8.6* 8.5*   8.6* 8.5* 8.8* 8.6*  MG 2.0  --  2.1 1.9  2.0  --  1.8  PHOS  --  3.5 4.9* 4.6  --  3.4 4.7*    Liver Function Tests: Recent Labs  Lab 10/17/21 0436 10/18/21 0440 10/19/21 0337 10/20/21 1451 10/21/21 0405  ALBUMIN 2.0* 2.0* 2.1* 2.5* 2.2*   No results for input(s): LIPASE, AMYLASE in the last 168 hours. No results for input(s): AMMONIA in the last 168 hours.   CBC: Recent Labs  Lab 10/17/21 0435 10/18/21 0440 10/19/21 0337 10/20/21 0458 10/21/21 0405  WBC 9.7 10.4 9.3 8.8 7.6  HGB 8.4* 8.8* 9.0* 8.6* 8.0*  HCT 26.9* 28.2* 28.7* 27.2* 26.0*  MCV 94.4 94.3 95.3 93.2 94.2  PLT 617* 604* 590* 523* 440*    INR: Recent Labs  Lab 10/17/21 0435 10/18/21 0440 10/19/21 0337 10/20/21 0458 10/21/21 0405  INR 1.8* 1.7* 1.6* 1.7* 1.9*    Other results:   Imaging   No results found.   Medications:     Scheduled Medications:  amiodarone  200 mg Oral Daily   Chlorhexidine Gluconate Cloth  6 each Topical Q0600   Chlorhexidine Gluconate Cloth  6 each Topical Q0600   darbepoetin (ARANESP) injection - NON-DIALYSIS  150 mcg Subcutaneous Q Mon-1800   docusate sodium  100 mg Oral BID   feeding supplement  237 mL Oral TID BM   mouth rinse  15 mL Mouth Rinse BID   mirtazapine  7.5 mg Oral QHS   montelukast  5 mg Oral QHS   multivitamin  1 tablet Oral QHS   pantoprazole  40 mg Oral Daily   rosuvastatin  10 mg Oral Daily   senna  2 tablet Oral QHS   sildenafil  20 mg Oral TID   sodium chloride flush  10-40 mL Intracatheter Q12H   sodium chloride flush  3 mL Intravenous Q12H   Warfarin - Pharmacist Dosing Inpatient   Does not apply q1600    Infusions:  sodium chloride Stopped (09/18/21 0803)   sodium chloride Stopped (10/02/21 0900)   sodium chloride     sodium chloride     promethazine (PHENERGAN) injection (IM or IVPB) Stopped (10/10/21 1755)    PRN Medications: sodium chloride, sodium chloride, sodium chloride, acetaminophen, albuterol, alteplase, Gerhardt's butt cream, heparin, heparin, lidocaine  (PF), lidocaine-prilocaine, lip balm, midodrine, ondansetron (ZOFRAN) IV, oxyCODONE, pentafluoroprop-tetrafluoroeth, polyethylene glycol, promethazine (PHENERGAN) injection (IM or IVPB), sodium chloride flush, sodium chloride flush, traMADol   Assessment/Plan:    1. Acute on chronic systolic CHF:  Long-standing cardiomyopathy.  [place].  Echo this admission with EF < 20%, severe LV dilation, restrictive diastolic function, moderate RV dysfunction, moderate MR, mod-severe TR. Cause of cardiomyopathy is uncertain.  He  has a history of CAD, but I do not think that the described CAD from the past could explain his cardiomyopathy, but CAD could have progressed.  With difficulty tolerating GDMT/need for midodrine and cardiorenal syndrome as well as profound volume overload + NYHA class IV symptoms,  concerned for low output HF. Co-ox off milrinone was 36%, milrinone started and increased to 0.375 but CO remained low. NE added and Impella 5.5 placed 12/23. EF 10% on TEE 12/23. s/p HM III VAD on 12/28.  Ramp echo 01/03 with EF 20-25%, RV moderately reduced, septum mid-line.  Ramp echo again on 1/12 showed severely dilated/moderately dysfunctional RV with severe TR and left-shifted septum with small LV.  Speed decreased on 1/12 to 5000 rpm over 2 sessions due to RV failure and milrinone 0.25 + NE 3 restarted.  Echo repeated on 1/16, RV looks better and LV larger but still with left shift of the septum.  Able to increase CVVH, weight down significantly.  Echo again on 1/18, speed decreased to 4900 with RV mildly dysfunctional and severely enlarged with severe TR.  CVVHD stopped 1/20.  1/23 delay in iHD due to staffing, developed increased dyspnea + volume overload CVP >20 and low flow alarms. VAD speed reduced to 4800. CVVH restarted. Milrinone restarted 0.25. Speed increased back to 5000 rpm and then to 5200.  Volume much improved. CVVH stopped 1/25 and now getting iHD.  Milrinone has been stopped, he is  also off midodrine. Co-ox 59% today. Stable LVAD parameters.  - Will add midodrine 5 mg MWF pre-HD.  - Next HD planned for tomorrow.    - Continue  sildenafil 20 tid for RV.   - Warfarin for INR 2-2.5.  INR 1.9 today Discussed dosing with PharmD personally.  - Continue 81 mg aspirin 2. Tricuspid regurgitation:  Tricuspid repair not done at time of VAD d/t proximity of ICD wires and hypotension during surgery.  He has severe TR.  3. Atrial fibrillation: IN SR - Continue amiodarone to 200 mg daily . 4. AKI on CKD stage 3: Gradual rise (was about 1.7 pre-op) since surgery.  He has not been hypotensive post-op, suspect intra-op hypotension led to development of ATN => urine sediment looked like ATN per renal. Got CVVH. TDC placed 1/7. Now off CVVH and now with iHD, tolerating so far.  - HD again tomorrow.  - Will give midodrine on the mornings of HD.   5. CAD: History of PCI to OM1 in 2007 and RCA in 2013.  No CP or ACs.  - Deferred cardiac cath due to AKI and plan for VAD - Continue Crestor.  6. ID: Had fever 12/21, PCT 1.03.  Blood cultures negative. ?Phlebitis at IV site. Post-op fever with elevated WBCs, PCT 2.63 -> 1.78.  He completed course of cefepime.  - Resolved.  7. Constipation: Resolved.  8. Hyponatremia: Hypervolemic hyponatremia.  Controlled via dialysis.  9. Anemia: 1 unit PRBCs on 1/12,  1 unit PRBs 1/13, 1 unit 1/16. No overt bleeding, ?losses via CVVH.  Hgb 8 today, no evidence bleeding - Transfuse for Hgb <7.5  - Received IV iron x 5 doses per nephrology (finished 1/20) 10. Confusion/delirium: Initially post-op and started on Precedex.  Resolved.  11. F/E/N: TFs via Cortrack for poor nutritional status, now off.  Improving po intake.  - Added remeron to help with appetite.  12. Left pleural effusion: - s/p thoracentesis 1/7 - s/p thoracentesis 1/12. 13. Pericardial effusion: Stable moderate pericardial effusion, primarily localized to along  the lateral wall of the LV.  Does  not appear to be affecting the RV/doubt tamponade.  Discussed after 1/12 and 1/18 echo with Dr. Cyndia Bent, conservative management for now.  14. Right great toe pain: - ? Possible gout.  15. Hyperkalemia - Resolved w/ HD and lokelma.  16. Deconditioning:  - Improving, plan for home health/PT.   LVAD teaching today, working on getting him a seat at dialysis center in [place].  Hopefully can get him home Thursday then to HD outpatient Friday.    Loralie Champagne 10/21/2021 8:44 AM

## 2021-10-21 NOTE — Progress Notes (Signed)
Occupational Therapy Treatment Patient Details Name: Isaac [name] MRN: [id number] DOB: 1951-04-27 Today's Date: 10/21/2021   History of present illness 71 y.o. male admitted 09/07/21 with near-syncope, nausea, poor oral intake, generalized weakness. Workup for advanced heart failure and persistent inotropic with requirement, cardiogenic shock. [place] 12/19. Impella placed on 12/23. LVAD placed 12/28. Pt with post-op delirium and agitation. CRRT initiated 1/3. S/p thoracentesis 1/12. S/p cortrak placement 1/13. CRRT stopped 1/20.  Pt back on CRRT on 1/23 due to staffing of HD. PMH includes advanced CHF (LVEF <20%), AICD/PPM, PAF on Eliquis, CKD 3, HTN, HLD.   OT comments  Pt progressing towards established OT goals. Pt performing ADLs and functional mobility with Supervision. Continues to requiring Min cues for LVAD management and adherence to sternal precautions. Will continue to follow acutely as admitted. Update dc recommendation to home without follow. Updated goals.    Recommendations for follow up therapy are one component of a multi-disciplinary discharge planning process, led by the attending physician.  Recommendations may be updated based on patient status, additional functional criteria and insurance authorization.    Follow Up Recommendations  No OT follow up    Assistance Recommended at Discharge Frequent or constant Supervision/Assistance  Patient can return home with the following  A little help with walking and/or transfers;A little help with bathing/dressing/bathroom;Direct supervision/assist for medications management;Direct supervision/assist for financial management;Assistance with cooking/housework   Equipment Recommendations  BSC/3in1    Recommendations for Other Services PT consult    Precautions / Restrictions Precautions Precautions: Fall;Sternal;Other (comment) Precaution Comments: LVAD Restrictions Other Position/Activity Restrictions: Sternal Precautions        Mobility Bed Mobility Overal bed mobility: Needs Assistance Bed Mobility: Rolling, Sidelying to Sit, Sit to Sidelying Rolling: Supervision Sidelying to sit: Supervision     Sit to sidelying: Supervision General bed mobility comments: Supervision for safety.    Transfers Overall transfer level: Needs assistance Equipment used: Rolling walker (2 wheels) Transfers: Sit to/from Stand Sit to Stand: Supervision           General transfer comment: Supervision for safety     Balance Overall balance assessment: Needs assistance Sitting-balance support: No upper extremity supported, Feet supported Sitting balance-Leahy Scale: Good     Standing balance support: During functional activity Standing balance-Leahy Scale: Fair                             ADL either performed or assessed with clinical judgement   ADL Overall ADL's : Needs assistance/impaired                     Lower Body Dressing: Supervision/safety;Sit to/from stand   Toilet Transfer: Supervision/safety;Ambulation           Functional mobility during ADLs: Supervision/safety;Rolling walker (2 wheels) General ADL Comments: Pt switching to batteries with supervision, decreased attention to controller with bed mobility.    Extremity/Trunk Assessment Upper Extremity Assessment Upper Extremity Assessment: Generalized weakness RUE Deficits / Details: impella placed, no edema noted. AROM of hand, wrist elbow WFL.   Lower Extremity Assessment Lower Extremity Assessment: Defer to PT evaluation        Vision   Vision Assessment?: No apparent visual deficits   Perception     Praxis      Cognition Arousal/Alertness: Awake/alert Behavior During Therapy: WFL for tasks assessed/performed Overall Cognitive Status: Impaired/Different from baseline Area of Impairment: Memory, Problem solving  Memory: Decreased recall of precautions       Problem  Solving: Requires verbal cues General Comments: Requires cues to adhere to sternal precautions during mobility; able to problem solve how to change over from power to batteries with Min cues.        Exercises      Shoulder Instructions       General Comments      Pertinent Vitals/ Pain       Pain Assessment Pain Assessment: No/denies pain  Home Living                                          Prior Functioning/Environment              Frequency  Min 2X/week        Progress Toward Goals  OT Goals(current goals can now be found in the care plan section)  Progress towards OT goals: Progressing toward goals  Acute Rehab OT Goals OT Goal Formulation: With patient Time For Goal Achievement: 11/04/21 Potential to Achieve Goals: Good ADL Goals Pt Will Perform Lower Body Bathing: with modified independence;sit to/from stand Pt Will Perform Lower Body Dressing: with modified independence;sit to/from stand Pt Will Transfer to Toilet: with modified independence;ambulating;regular height toilet Additional ADL Goal #1: Pt will tolerate at least 8 minutes of OOB functional task without reast break to incrased activity tolerance Additional ADL Goal #2: pt will indep verbalize at least 2 energy conservation strategies to apply at d/c. Additional ADL Goal #3: Pt will manage LVAD equipment independently.  Plan Discharge plan needs to be updated    Co-evaluation    PT/OT/SLP Co-Evaluation/Treatment: Yes            AM-PAC OT "6 Clicks" Daily Activity     Outcome Measure   Help from another person eating meals?: None Help from another person taking care of personal grooming?: A Little Help from another person toileting, which includes using toliet, bedpan, or urinal?: A Little Help from another person bathing (including washing, rinsing, drying)?: A Little Help from another person to put on and taking off regular upper body clothing?: A Little Help  from another person to put on and taking off regular lower body clothing?: A Little 6 Click Score: 19    End of Session Equipment Utilized During Treatment: Rolling walker (2 wheels)  OT Visit Diagnosis: Unsteadiness on feet (R26.81);Muscle weakness (generalized) (M62.81);Other symptoms and signs involving cognitive function;Other (comment);Pain   Activity Tolerance Patient tolerated treatment well   Patient Left in bed;with call bell/phone within reach   Nurse Communication Mobility status        Time: 0263-7858 OT Time Calculation (min): 26 min  Charges: OT General Charges $OT Visit: 1 Visit OT Treatments $Self Care/Home Management : 8-22 mins $Therapeutic Activity: 8-22 mins  [place], OTR/L Acute Rehab Pager: [phone] Office: [place] 10/21/2021, 5:05 PM

## 2021-10-21 NOTE — Progress Notes (Signed)
[facility] Progress Note     Assessment/ Plan:   Acute kidney injury on CKD3b: with a baseline cr in the 1.6-2 range. Acute component either from cardiorenal +/- ATN with decreased renal function from hemodynamics and CPB + hypotensive episodes + pressors.  - CRRT 1/3-1/20, 1/23-1/25 then iHD starting 1/26. has tunneled HD cath in place.  Now has been HD dep for 3 + weeks-  the longer this goes the less likely a possibility for recovery- LVAD and HD is complicated but not impossible, have the renal navigator reaching out to the [facility] unit ( closest to him )  to see if they can accommodate an LVAD.  Pt has told Dr. Aundra Dubin that he worships on Saturday and is highly desirous of a MWF schedule so will run him tomorrow and pass that on the the navigator - Plan to continue HD MWF schedule for now   Cardiogenic shock: s/p LVAD 09/17/22 (HM 3).   - on  midodrine now, off NE and off milrinone -   UF as able with HD  -Also on sildenafil  CAD with h/o PCI last in 2013  Anemia of CKD: Hgb stable around 8-9. S/p IV iron, on ESA.  Afib- on amio  Acute hypoxic RF: s/p thoracentesis 1.3 L 10/02/21  Hyperkalemia: resolved with RRT  Bones-  phos Ok -  watch for binder need-   PTH 59  Dispo: tolerating HD better, renal navigator helping set up outpatient dialysis  Subjective:    Some hypotension yesterday on dialysis.  Tolerated the full session but patient did feel lightheaded during the session.  Feels like his volume status is acceptable.    Objective:   BP (!) 85/72    Pulse 63    Temp 98.3 F (36.8 C) (Oral)    Resp 18    Ht 5\' 9"  (1.753 m)    Wt 72.7 kg    SpO2 93%    BMI 23.67 kg/m   Intake/Output Summary (Last 24 hours) at 10/21/2021 0912 Last data filed at 10/20/2021 1952 Gross per 24 hour  Intake 0 ml  Output 526 ml  Net -526 ml   Weight change: 0 kg  Physical Exam: GEN: NAD, alert, pleasant  HEENT: wearing glasses, no nasal discharge, moist mucous  membranes LUNGS: Bilateral chest rise with no increased work of breathing CV: LVAD, no audible murmur EXT: minimal edema, much improved, legs wrapped at tibial level ACCESS: rt IJ TC c/d/I-     Imaging: No results found.  Labs: BMET Recent Labs  Lab 10/16/21 0425 10/16/21 1621 10/17/21 0436 10/18/21 0440 10/19/21 0337 10/20/21 0458 10/20/21 1451 10/21/21 0405  NA 135 132* 131* 136 136   137 134* 136 136  K 4.2 3.9 4.0 4.5 3.9   3.9 4.0 3.2* 3.7  CL 102 94* 97* 99 99   100 97* 100 100  CO2 24 27 26 24 26   26 25 26 26   GLUCOSE 98 117* 113* 87 78   78 103* 95 95  BUN 32* 13 19 31* 19   19 29* 12 20  CREATININE 3.41* 2.01* 2.81* 3.93* 3.23*   3.22* 4.56* 2.70* 3.69*  CALCIUM 8.8* 8.4* 8.2* 8.6* 8.5*   8.6* 8.5* 8.8* 8.6*  PHOS 4.0 2.6 3.5 4.9* 4.6  --  3.4 4.7*   CBC Recent Labs  Lab 10/18/21 0440 10/19/21 0337 10/20/21 0458 10/21/21 0405  WBC 10.4 9.3 8.8 7.6  HGB 8.8* 9.0* 8.6* 8.0*  HCT 28.2* 28.7* 27.2* 26.0*  MCV 94.3 95.3 93.2 94.2  PLT 604* 590* 523* 440*    Medications:     amiodarone  200 mg Oral Daily   Chlorhexidine Gluconate Cloth  6 each Topical Q0600   Chlorhexidine Gluconate Cloth  6 each Topical Q0600   darbepoetin (ARANESP) injection - NON-DIALYSIS  150 mcg Subcutaneous Q Mon-1800   docusate sodium  100 mg Oral BID   feeding supplement  237 mL Oral TID BM   mouth rinse  15 mL Mouth Rinse BID   [START ON 10/22/2021] midodrine  5 mg Oral Once per day on Mon Wed Fri   mirtazapine  7.5 mg Oral QHS   montelukast  5 mg Oral QHS   multivitamin  1 tablet Oral QHS   pantoprazole  40 mg Oral Daily   rosuvastatin  10 mg Oral Daily   senna  2 tablet Oral QHS   sildenafil  20 mg Oral TID   sodium chloride flush  10-40 mL Intracatheter Q12H   sodium chloride flush  3 mL Intravenous Q12H   Warfarin - Pharmacist Dosing Inpatient   Does not apply [facility]

## 2021-10-21 NOTE — TOC CM/SW Note (Addendum)
HF TOC CM spoke to pt at bedside. Explained [facility] unable to service referral. Pt agreeable to [facility]. [employer] to deliver Rolling walker with seat and 3n1 bsc to room at dc. Jonnie Finner RN3 CCM, Heart Failure TOC CM [phone]     HF TOC CM received call from [facility] rep, [place] and unable to accept referral. [facility], preferred [facility] provider with new referral. Spoke to rep, Brittney. [place], Heart Failure TOC CM [phone]

## 2021-10-21 NOTE — Progress Notes (Signed)
Round on patient today secondary to necessity for outpatient dialysis related to AKI. Spoke with patient about the functionality of his tunneled dialysis catheter and the necessity to keep site clean and dry. Patient also educated at the bedside about the effects of hyperkalemia, fluid overload and hyperphosphatemia. Patient currently taking midodrine and was educated on the importance taking the medication to keep his blood pressure up high enough. We did discuss dialysis in the outpatient setting and the importance of his nephrology follow-ups to monitor his kidneys. Patient verbalizes understanding. Kidney failure book, handouts, and contact information given to patient. Will continue to follow as appropriate.  Dorthey Sawyer, RN  Dialysis Nurse Coordinator [phone]

## 2021-10-21 NOTE — Progress Notes (Signed)
Mobility Specialist: Progress Note   10/21/21 1732  Mobility  Activity  (Cancel)   After consulting with RN, recommending hold off on mobility until tomorrow. Will f/u tomorrow.   [facility] Jalene Lacko Mobility Specialist Mobility Specialist 4 [place]: [phone] Mobility Specialist 2 [place] and [employer]: [phone]

## 2021-10-21 NOTE — TOC Initial Note (Addendum)
Transition of Care [facility]) - Initial/Assessment Note    Patient Details  Name: Isaac [name] MRN: [id number] Date of Birth: 11-Jan-1951  Transition of [facility]) CM/SW Contact:    Erenest Rasher, RN Phone Number: [phone] 10/21/2021, 10:19 AM  Clinical Narrative:                 HF TOC CM spoke to pt and wife at bedside. Offered choice for [facility]. Pt and wife agreeable to agency that will accept insurance. Contacted [facility] rep, Patrick with new referral. Pt will need Rollator and bedside commode for home. Scheduled dc home Thursday. Will contacted [facility] for delivery of DME to room today.   [facility] accepted referral for [facility]. Will need HHPT/OT orders with F2F. Message sent to attending.      Expected Discharge Plan: [place] Barriers to Discharge: Continued Medical Work up   Patient Goals and CMS Choice Patient states their goals for this hospitalization and ongoing recovery are:: patient states he is feeling better CMS Medicare.gov Compare Post Acute Care list provided to:: Patient Choice offered to / list presented to : Patient  Expected Discharge Plan and Services Expected Discharge Plan: [place] In-house Referral: Clinical Social Work Discharge Planning Services: CM Consult Post Acute Care Choice: [place] arrangements for the past 2 months: [place]                 DME Arranged: 3-N-1, Walker rolling with seat DME Agency: [employer] Date DME Agency Contacted: 10/21/21 Time DME Agency Contacted: [id number] Representative spoke with at DME Agency: Melina Modena HH Arranged: PT, OT [place] Agency: [place] Date [place]: 10/21/21 Time [place]: 103 Representative spoke with at [place]: Freddi Starr  Prior Living Arrangements/Services Living arrangements for the past 2 months: Single Family Home Lives with:: Spouse Patient language and need for interpreter  reviewed:: Yes Do you feel safe going back to the place where you live?: Yes      Need for Family Participation in Patient Care: Yes (Comment) Care giver support system in place?: Yes (comment)   Criminal Activity/Legal Involvement Pertinent to Current Situation/Hospitalization: No - Comment as needed  Activities of Daily Living Home Assistive Devices/Equipment: None ADL Screening (condition at time of admission) Patient's cognitive ability adequate to safely complete daily activities?: Yes Is the patient deaf or have difficulty hearing?: No Does the patient have difficulty seeing, even when wearing glasses/contacts?: No Does the patient have difficulty concentrating, remembering, or making decisions?: No Patient able to express need for assistance with ADLs?: No Does the patient have difficulty dressing or bathing?: No Independently performs ADLs?: Yes (appropriate for developmental age) Does the patient have difficulty walking or climbing stairs?: Yes (SOB) Weakness of Legs: None Weakness of Arms/Hands: None  Permission Sought/Granted Permission sought to share information with : Case Manager, Family Supports, PCP Permission granted to share information with : Yes, Verbal Permission Granted  Share Information with NAME: Apolonio Cutting  Permission granted to share info w AGENCY: IP rehab  Permission granted to share info w Relationship: wife  Permission granted to share info w Contact Information: [phone]  Emotional Assessment Appearance:: Appears stated age Attitude/Demeanor/Rapport: Gracious Affect (typically observed): Accepting Orientation: : Oriented to Self, Oriented to Place, Oriented to  Time, Oriented to Situation   Psych Involvement: No (comment)  Admission diagnosis:  CHF (congestive heart failure) (HCC) [I50.9] Syncope [R55] Elevated TSH [R79.89] Acute  on chronic combined systolic and diastolic CHF (congestive heart failure) ([place]) [I50.43] Patient Active  Problem List   Diagnosis Date Noted   Pressure injury of skin 10/07/2021   LVAD (left ventricular assist device) present ([place])    Protein-calorie malnutrition, severe 09/10/2021   Elevated TSH    PICC (peripherally inserted central catheter) in place    Cardiogenic shock ([place])    Syncope 09/07/2021   CHF (congestive heart failure) ([place]) 09/06/2021   CAD (coronary artery disease) 08/22/2021   Cardiomyopathy ([place]) 08/22/2021   Mixed hyperlipidemia 08/22/2021   A-fib ([place]) 08/22/2021   Elevated troponin 08/22/2021   Acute on chronic combined systolic and diastolic CHF (congestive heart failure) ([place]) 08/22/2021   Benign essential hypertension 02/22/2015   Acute on chronic systolic CHF (congestive heart failure), NYHA class 3 ([place]) 07/05/2014   PCP:  [name], MD Pharmacy:   [id number], [place] [address] [place] [address] Phone: [phone] Fax: [phone]  [id number] [place] [address] [place] [address] Phone: [phone] Fax: [phone]     Social Determinants of Health (SDOH) Interventions    Readmission Risk Interventions No flowsheet data found.

## 2021-10-21 NOTE — Progress Notes (Addendum)
LVAD Coordinator Rounding Note:  Admitted 09/06/21 due to CHF exacerbation to Dr Claris Gladden service. He was started on Milrinone on admission. Febrile 09/10/21- suspected to have UTI- antibiotics started. Impella 5.5 placed 09/12/21.   HM III LVAD implanted on 09/17/21 by Dr Cyndia Bent under destination therapy criteria. 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   Pt laying in bed. Wife at bedside. Denies complaints.  HD will be on a M-W-F schedule. Discussed with pt and wife need for placement at HD center prior to discharge. They spoke with Melven Sartorius (renal navigator) yesterday afternoon regarding placement preference. Discussed importance of self-managing VAD equipment while at dialysis. Advised to keep controller on top of blankets where he can see and hear any alarms. Advised to bring back up bag with him. He verbalized understanding of all the above.   Spoke with Melven Sartorius (renal navigator) yesterday regarding out-pt HD placement. Provided her with VAD office and pager number, and asked to call us with placement information once arranged. VAD coordinators will reach out to placement center to arrange VAD education once accepted.   VAD discharge education complete with pt and his wife today. See separate note for documentation.   Vital signs: Temp: 98.3 HR: 60 paced Doppler Pressure: 71 Auto Cuff BP: 85/72 (78) O2 Sat: 99% on RA Wt: 185.2>196.8.Marland Kitchen..154.3>155.4>163.8>155.4>150.7>154.1>152>159.4>160.2 lbs    LVAD interrogation reveals:  Speed: 5200 Flow: 4.5 Power: 3.6w PI: 3.2 Hematocrit: 20-- DO NOT ADJUST  Alarms: none Events: 20 PI events so far today  Fixed speed: 5200 Low speed limit: 4900  Drive Line: Dressing change performed by patient's wife with VAD coordinator supervision. Existing VAD dressing C/D/I with anchor intact and accurately applied.  Existing VAD dressing removed and site care  performed using sterile technique. Drive line exit site cleaned with Chlora prep applicators x 2, allowed to dry, rinsed with saline and gauze dressing with silver strip applied. Exit site not incorporated, the velour is fully implanted at exit site.  Scant amount of dried tissue fat necrosis drainage noted on silver strip. No redness, tenderness, foul odor or rash noted. Drive line anchor replaced. Continue twice weekly dressing changes on Monday/Thursday per Nurse Davonna Belling, [place] coordinator, or trained caregiver. May be able to advance to weekly changes on Thursday. Next dressing change due: 10/23/21.   Labs:  LDH trend: 344>379.............226>257>238>229>222>192>177  INR trend: 1.6>1.8>2.8>2.9>2.1.................2.6>2.2>1.7>1.8>1.7>1.9  Anticoagulation Plan: -INR Goal: 2.0 - 2.5 -ASA Dose: 81 mg daily   Blood Products:  Intra Op: 09/17/21  1 PLT  4 FFP   4 PRBC  Cellsaver- 803 cc Post Op:  - 09/17/21>>1 PRBC - 09/18/21>> 3 PRBCs - 09/19/21>> 1 PRBC -  09/26/21>> 1 PRBC - 10/02/21>>1 PRBC - 10/03/21>>1 PRBC - 10/06/21 >> 1 PRBC  Device: - [employer] dual - AV paced 60 -Therapies: ON  *Will need to be referred to device clinic at discharge*  Arrythmias: 3 episodes of VT that required defib while in OR 09/17/21. Currently on Amiodarone gtt. Remains in afib.   Respiratory: extubated 09/18/22  Nitric Oxide:  off 09/18/22  Renal:  09/23/21>>started CVVH 10/10/21>>stopped CVVH 10/13/21>> restarted CVVH 10/15/21>> stopped CVVH  10/16/21>>started HD (hemodialysis)  Infection:  09/10/21>> blood cultures >> No growth 5 days; final 09/12/21>> urine culture >> No growth; final  09/25/21>>blood cultures>>negative  09/27/21>>pleural effusion>>negative, final 10/02/21>> pleural effusion >> no growth; final   Drips:  Milrinone 0.125 mcg/kg/min - off 10/17/21  Patient Education: Discharge  education completed with patient and his wife today. See separate note for  documentation.  Plan/Recommendations:  1. Page VAD coordinator for drive line or equipment concerns 2. Twice a week dressing change per Nurse Davonna Belling or [place] coordinator on Monday and Thursday. 3. Discharge VAD teaching completed with patient and his wife today.  Emerson Monte RN [place] Coordinator  Office: [phone]  24/7 Pager: [phone]

## 2021-10-21 NOTE — Progress Notes (Signed)
VAD Discharge Teaching Note:  Discharge VAD teaching completed with    The home inspection checklist has been reviewed and no unsafe conditions have been identified. Family reports that there are at least two dedicated grounded, 3-prong outlets with clearly labeled circuit breaker has been established in the bedroom for power module and Charity fundraiser.   Both patient and caregiver have been trained on the following:  1. HM III LVAD overview of system operations  2. Overview of major lifestyle accommodations and cautions   3. Overview of system components (features and functions) 4. Changing power sources 5. Overview of alerts and alarms 6. How to identify and manage an emergency including when pump is running and when pump has stopped  7. Changing system controller 8. Maintain emergency contact list and medications  The patient and caregiver have successfully demonstrated:  1. Changing power source (from batteries to mobile power unit, mobile power unit to batteries, and replacing batteries) 2. Perform system controller self test  3. Check and charge batteries  4. Change system controller 5. Paged VAD pager and programmed number in phones  A daily flow sheet with patient  weight, temperature,  flow, speed, power, and PI, along with daily self checks on system controller and power module have been performed by patient and caregiver during hospitalization and will also be done daily at home.   The caregiver has been trained on percutaneous lead exit site care, care of the driveline and dressing changes. She has performed dressing changes during patient's hospitalization under my supervision with the support of the nursing staff. The importance of lead immobilization has been stressed to patient and caregiver using the attachment device. Plan for her to perform dressing change again later this week. Then should be able to be checked off for independent dressings. The caregiver has  successfully demonstrated the following: 1. Cleansing site with sterile technique 2. Dressing care and maintenance  3. Immobilizing driveline  The following routine activities and maintenance have been reviewed with patient and caregiver and both verbalize understanding:  1. Stressed importance of never disconnecting power from both controller power leads at the same time, and never disconnecting both batteries at the same time, or the pump will alarm and eventually stop if no power supply restored.  2. Plug the mobile power unit (MPU) and the universal battery charger (UBC) into properly grounded (3 prong) outlets dedicated to PM use. Do NOT use adapter (cheater plug) for ungrounded outlets or multiple portable socket outlets (power strips) 3. Do not connect the PM or MPU to an outlet controlled by wall switch or the device may not work 4. Transfer from MPU to batteries during [facility] mains power failure. The PM has internal backup battery that will power the pump while you transfer to batteries 5. Keep a backup system controller, charged batteries, battery clips, and flashlight near you during sleep in case of electrical power outage 6. Clean battery, battery clip, and universal battery charger contacts weekly 7. Visually inspect percutaneous lead daily 8. Check cables and connectors when changing power source  9. Rotate batteries; keep all eight batteries charged 10. Always have backup system controller, battery clips, fully charged batteries, and spare fully charged batteries when traveling 11. Re-calibrate batteries every 70 uses; monitor battery life of 36 months or 360 uses; replace batteries at end of battery life   Identified the following changes in activities of daily living with pump: 1. No driving for at least six weeks and then only if doctor  gives permission to do so 2. No tub baths while pump implanted, and shower only if doctor gives permission 3. No swimming or submersion in water  while implanted with pump 4. Keep all VAD equipment away from water or moisture 5. Keep all VAD connections clean and dry 6. No contact sports or engage in jumping activities 7. Never have an MRI while implanted with the pump 8. Never leave or store batteries in extremely hot or cold places (such as   trunk of your car), or the battery life will be shortened 9. Call the doctor or hospital contact person if any change in how the pump sounds, feels, or works 10. Plan to sleep only when connected to the mobile power unit. Marland Kitchen 11. Keep a backup system controller, charged batteries, battery clips, and flashlight near you during sleep in case of electrical power outage 12. Do not sleep on your stomach 13. Talk with doctor before any long distance travel plans 14. Patient will need antibiotics prior to any dental procedure; instructed to contact VAD coordinator before any dental procedures (including routine cleaning)   Discharge binder given to patient and include the following: 1. List of emergency contacts 2. Wallet card 3. HM III Luggage tags 4. HM III Alarms for Patients and their Caregivers 5. HM III Patient Handbook 6. Daily diary sheets 7. Warfarin teaching sheets 8. Nosebleed teaching sheets 9. Medications you may and may not take with CHF list  Discharge equipment includes:  1. Two system controllers 2. One mobile power unit with attached 21 ' patient cable 3. One universal Charity fundraiser (UBC) 4. Eight fully charged batteries  5. Four battery clips 6. One travel case 7. One holster vest 8. Wearable accessory package 9. Daily dressing kits and anchors  Following notification process completed with: [facility] [employer]  Dr [name],  PCP   Discussed frequency and importance of INR checks; emphasized importance of maintaining INR goal to prevent clotting and or bleeding issues with pump.  Patient able to answer questions and asked good  questions pertaining to warfarin and diet/lifestyle changes necessary to be successful and safe.  Patient will have INR managed by VAD Clinic; current INR goal is 2.0 - 2.5. He is interested in home INR machine if covered by insurance. We will submit paperwork when this appropriate.  The patient has completed a proficiency test for the HM III and all questions have been answered. The pt and family have been instructed to call if any questions, problems, or concerns arise. Pt and caregiver successfully paged VAD coordinator using VAD pager emergency number and have been instructed to use this number only for emergencies. Patient and caregiver asked appropriate questions, had good interaction with VAD coordinator, and verbalized understanding of above instructions.   Pt discharging to his house per original plan. His wife will be at home with him. She will plan to change drive line dressing as ordered by VAD coordinators. Pt verbalized agreement with this plan.   Total elapsed time:  3.5 hours  Emerson Monte RN [place] Coordinator  Office: [phone]  24/7 Pager: [phone]

## 2021-10-21 NOTE — Progress Notes (Signed)
PT Cancellation Note  Patient Details Name: [name] MRN: [id number] DOB: 1950/12/09   Cancelled Treatment:    Reason Eval/Treat Not Completed: Other (comment) (LVAD team doing education large part of am.)   [name] 10/21/2021, 10:52 AM [name] M,PT Acute Rehab Services [phone] (pager)

## 2021-10-21 NOTE — Progress Notes (Signed)
CARDIAC REHAB PHASE I   PRE:  Rate/Rhythm: 60 pacing    BP: sitting 86 dopplared    SaO2: wouldn't register  MODE:  Ambulation: 340 ft   POST:  Rate/Rhythm: 65 pacing    BP: sitting 84 dopplared     SaO2: would not register  Pt able to don and doff batteries independently. Needed verbal reminders to untangle cables, for sternal precautions, and to carry black bag with [place]. Stood independently and walked with RW at slow pace. Rest against wall x1. Return to bed. [place], ACSM 10/21/2021 2:41 PM

## 2021-10-21 NOTE — Progress Notes (Signed)
Pt's case is being reviewed by [facility] final acceptance/schedule. Will f/u with LVAD team once clinic confirms they can accept pt. Will assist as needed.   Melven Sartorius Renal Navigator [phone]

## 2021-10-22 LAB — PROTIME-INR
INR: 1.9 — ABNORMAL HIGH (ref 0.8–1.2)
Prothrombin Time: 21.6 seconds — ABNORMAL HIGH (ref 11.4–15.2)

## 2021-10-22 LAB — GLUCOSE, CAPILLARY
Glucose-Capillary: 66 mg/dL — ABNORMAL LOW (ref 70–99)
Glucose-Capillary: 81 mg/dL (ref 70–99)
Glucose-Capillary: 81 mg/dL (ref 70–99)
Glucose-Capillary: 89 mg/dL (ref 70–99)
Glucose-Capillary: 90 mg/dL (ref 70–99)
Glucose-Capillary: 91 mg/dL (ref 70–99)

## 2021-10-22 LAB — CBC
HCT: 27 % — ABNORMAL LOW (ref 39.0–52.0)
Hemoglobin: 8.4 g/dL — ABNORMAL LOW (ref 13.0–17.0)
MCH: 29.4 pg (ref 26.0–34.0)
MCHC: 31.1 g/dL (ref 30.0–36.0)
MCV: 94.4 fL (ref 80.0–100.0)
Platelets: 412 10*3/uL — ABNORMAL HIGH (ref 150–400)
RBC: 2.86 MIL/uL — ABNORMAL LOW (ref 4.22–5.81)
RDW: 17.7 % — ABNORMAL HIGH (ref 11.5–15.5)
WBC: 8.3 10*3/uL (ref 4.0–10.5)
nRBC: 0 % (ref 0.0–0.2)

## 2021-10-22 LAB — RENAL FUNCTION PANEL
Albumin: 2.2 g/dL — ABNORMAL LOW (ref 3.5–5.0)
Anion gap: 11 (ref 5–15)
BUN: 30 mg/dL — ABNORMAL HIGH (ref 8–23)
CO2: 24 mmol/L (ref 22–32)
Calcium: 8.5 mg/dL — ABNORMAL LOW (ref 8.9–10.3)
Chloride: 100 mmol/L (ref 98–111)
Creatinine, Ser: 4.77 mg/dL — ABNORMAL HIGH (ref 0.61–1.24)
GFR, Estimated: 12 mL/min — ABNORMAL LOW (ref >60)
Glucose, Bld: 100 mg/dL — ABNORMAL HIGH (ref 70–99)
Phosphorus: 5.2 mg/dL — ABNORMAL HIGH (ref 2.5–4.6)
Potassium: 3.5 mmol/L (ref 3.5–5.1)
Sodium: 135 mmol/L (ref 135–145)

## 2021-10-22 LAB — COOXEMETRY PANEL
Carboxyhemoglobin: 2.3 % — ABNORMAL HIGH (ref 0.5–1.5)
Methemoglobin: 1.3 % (ref 0.0–1.5)
O2 Saturation: 86.5 %
Total hemoglobin: 8.6 g/dL — ABNORMAL LOW (ref 12.0–16.0)

## 2021-10-22 LAB — LACTATE DEHYDROGENASE: LDH: 173 U/L (ref 98–192)

## 2021-10-22 LAB — BRAIN NATRIURETIC PEPTIDE: B Natriuretic Peptide: 1171.3 pg/mL — ABNORMAL HIGH (ref 0.0–100.0)

## 2021-10-22 LAB — MAGNESIUM: Magnesium: 1.8 mg/dL (ref 1.7–2.4)

## 2021-10-22 MED ORDER — CHLORHEXIDINE GLUCONATE CLOTH 2 % EX PADS
6.0000 | MEDICATED_PAD | Freq: Every day | CUTANEOUS | Status: DC
  Administered 2021-10-22 – 2021-10-24 (×3): 6 via TOPICAL

## 2021-10-22 MED ORDER — ALBUMIN HUMAN 25 % IV SOLN
INTRAVENOUS | Status: AC
  Administered 2021-10-22: 25 g via INTRAVENOUS
  Filled 2021-10-22: qty 100

## 2021-10-22 MED ORDER — WARFARIN SODIUM 3 MG PO TABS
3.0000 mg | ORAL_TABLET | Freq: Once | ORAL | Status: AC
  Administered 2021-10-22: 3 mg via ORAL
  Filled 2021-10-22 (×2): qty 1

## 2021-10-22 MED ORDER — ALBUMIN HUMAN 25 % IV SOLN: 25.0000 g | Freq: Once | INTRAVENOUS | Status: AC

## 2021-10-22 NOTE — Progress Notes (Signed)
PT Cancellation Note  Patient Details Name: Isaac [name] MRN: [id number] DOB: 1950-11-14   Cancelled Treatment:    Reason Eval/Treat Not Completed: Patient at procedure or test/unavailable (HD)  Isaac [name], PT, DPT Acute Rehabilitation Services Pager [phone] Office [phone]    Deno Etienne 10/22/2021, 1:34 PM

## 2021-10-22 NOTE — Progress Notes (Addendum)
LVAD Coordinator Rounding Note:  Admitted 09/06/21 due to CHF exacerbation to Dr Claris Gladden service. He was started on Milrinone on admission. Febrile 09/10/21- suspected to have UTI- antibiotics started. Impella 5.5 placed 09/12/21.   HM III LVAD implanted on 09/17/21 by Dr Cyndia Bent under destination therapy criteria. 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   Pt sitting up on side of bed; has switched to batteries to perform morning self care. Patient denies any complaints today. He reports he is scheduled for dialysis later today. Nurse will call VAD pager for assistance with patient transport to IP dialysis.   Spoke with Melven Sartorius (renal navigator) who advised [facility] is evaluating if they will accept Mr. Justin Mend to their facility for OP dialysis. Olivia Mackie asked that VAD Coordinator contact Shawn Orthoptist) of that facility. [employer] and he says they have agreed to take Mr. Web for 3x week dialysis starting Monday, 10/27/21. Raquel Sarna will call us back with time (does not know at this time) so [facility] VAD Coordinator can meet patient at that time for staff education. Dr. Aundra Dubin updated.    Vital signs: Temp: 98 HR: 66  Doppler Pressure: 90 Auto Cuff BP:  97/76 (84) O2 Sat: 99% on RA Wt: 185.2>196.8.Marland Kitchen..154.3>155.4>163.8>155.4>150.7>154.1>152>159.4>160.2>152 lbs    LVAD interrogation reveals:  Speed: 5200 Flow: 4.1 Power: 3.8w PI: 3.4 Hematocrit: 20-- DO NOT ADJUST  Alarms: none Events:  Fixed speed: 5200 Low speed limit: 4900  Drive Line:  Existing VAD dressing C/D/I with anchor intact and accurately applied. Continue twice weekly dressing changes on Monday/Thursday per Nurse Davonna Belling, [place] coordinator, or trained caregiver. May be able to advance to weekly changes on Thursday. Next dressing change due: 10/23/21.   Labs:  LDH trend:  344>379.............226>257>238>229>222>192>177>173  INR trend: 1.6>1.8>2.8>2.9>2.1.................2.6>2.2>1.7>1.8>1.7>1.9>1.9  Anticoagulation Plan: -INR Goal: 2.0 - 2.5 -ASA Dose: 81 mg daily   Blood Products:  Intra Op: 09/17/21  1 PLT  4 FFP   4 PRBC  Cellsaver- 803 cc Post Op:  - 09/17/21>>1 PRBC - 09/18/21>> 3 PRBCs - 09/19/21>> 1 PRBC -  09/26/21>> 1 PRBC - 10/02/21>>1 PRBC - 10/03/21>>1 PRBC - 10/06/21 >> 1 PRBC  Device: - [employer] dual - AV paced 60 -Therapies: ON  *Will need to be referred to device clinic at discharge*  Arrythmias: 3 episodes of VT that required defib while in OR 09/17/21. Currently on Amiodarone gtt. Remains in afib.   Respiratory: extubated 09/18/22  Nitric Oxide:  off 09/18/22  Renal:  09/23/21>>started CVVH 10/10/21>>stopped CVVH 10/13/21>> restarted CVVH 10/15/21>> stopped CVVH  10/16/21>>started HD (hemodialysis)  Infection:  09/10/21>> blood cultures >> No growth 5 days; final 09/12/21>> urine culture >> No growth; final  09/25/21>>blood cultures>>negative  09/27/21>>pleural effusion>>negative, final 10/02/21>> pleural effusion >> no growth; final     Patient Education: Discharge education completed with patient and his wife 10/21/21..    Plan/Recommendations:  1. Page VAD coordinator for drive line or equipment concerns 2. Twice a week dressing change per Nurse Davonna Belling or [place] coordinator on Monday and Thursday. 3.  Home VAD equipment has been ordered.  4.  Possible discharge Friday, 10/24/21 after dialysis.   Zada Girt RN [place] Coordinator  Office: [phone]  24/7 Pager: [phone]

## 2021-10-22 NOTE — Progress Notes (Signed)
Outpatient Heart Failure CSW continuing to follow during implant hospitalization.  CSW collaborating with LVAD coordinators and no needs identified at this time- continuing to follow and assist as needed.   Jorge Ny, LCSW Clinical Social Worker Advanced [facility] Failure [facility] Desk#: [phone] Cell#: [phone]

## 2021-10-22 NOTE — Progress Notes (Signed)
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 Acute component either from cardiorenal +/- ATN with decreased renal function from hemodynamics and CPB + hypotensive episodes + pressors.  - CRRT 1/3-1/20, 1/23-1/25 then iHD starting 1/26. has tunneled HD cath in place.  Now has been HD dep for 3 + weeks-  the longer this goes the less likely a possibility for recovery- LVAD and HD is complicated but not impossible, have the renal navigator reaching out to the [facility] unit ( closest to him )  to see if they can accommodate an LVAD.  Pt has told Dr. Aundra Dubin that he worships on Saturday and is highly desirous of a MWF schedule so will run him tomorrow and pass that on the the navigator - Plan to continue HD MWF schedule for now -The patient's baseline creatinine was around 2.  Given he has not had significant renal recovery he most likely is approaching ESRD.  Will be 30 days of renal replacement therapy on 2/3.  I think it would be appropriate to call him ESRD at that time.   Cardiogenic shock: s/p LVAD 09/17/22 (HM 3).   - on  midodrine now, off NE and off milrinone -   UF as able with HD  -Also on sildenafil  CAD with h/o PCI last in 2013  Anemia of CKD: Hgb stable around 8-9. S/p IV iron, on ESA.  Afib- on amio  Acute hypoxic RF: s/p thoracentesis 1.3 L 10/02/21  Hyperkalemia: resolved with RRT  Bones-  phos Ok -  watch for binder need-   PTH 59  Dispo: tolerating HD better, renal navigator helping set up outpatient dialysis  Subjective:    Patient feels well today with no complaints.  Minimal urine output.  Planning for dialysis today    Objective:   BP 97/76    Pulse 66    Temp 98 F (36.7 C) (Oral)    Resp 20    Ht 5\' 9"  (1.753 m)    Wt 69 kg    SpO2 94% Comment: portable O2 probe   BMI 22.46 kg/m   Intake/Output Summary (Last 24 hours) at 10/22/2021 0950 Last data filed at 10/22/2021  0347 Gross per 24 hour  Intake 613 ml  Output 2 ml  Net 611 ml   Weight change: -3.3 kg  Physical Exam: GEN: NAD, alert, pleasant  HEENT: wearing glasses, no nasal discharge, moist mucous membranes LUNGS: Bilateral chest rise with no increased work of breathing CV: LVAD, no audible murmur EXT: minimal edema, much improved, legs wrapped at tibial level ACCESS: rt IJ TC   Imaging: No results found.  Labs: BMET Recent Labs  Lab 10/16/21 1621 10/17/21 0436 10/18/21 0440 10/19/21 0337 10/20/21 0458 10/20/21 1451 10/21/21 0405 10/22/21 0517  NA 132* 131* 136 136   137 134* 136 136 135  K 3.9 4.0 4.5 3.9   3.9 4.0 3.2* 3.7 3.5  CL 94* 97* 99 99   100 97* 100 100 100  CO2 27 26 24 26   26 25 26 26 24   GLUCOSE 117* 113* 87 78   78 103* 95 95 100*  BUN 13 19 31* 19   19 29* 12 20 30*  CREATININE 2.01* 2.81* 3.93* 3.23*   3.22* 4.56* 2.70* 3.69* 4.77*  CALCIUM 8.4* 8.2* 8.6* 8.5*   8.6* 8.5* 8.8* 8.6* 8.5*  PHOS 2.6 3.5  4.9* 4.6  --  3.4 4.7* 5.2*   CBC Recent Labs  Lab 10/19/21 0337 10/20/21 0458 10/21/21 0405 10/22/21 0516  WBC 9.3 8.8 7.6 8.3  HGB 9.0* 8.6* 8.0* 8.4*  HCT 28.7* 27.2* 26.0* 27.0*  MCV 95.3 93.2 94.2 94.4  PLT 590* 523* 440* 412*    Medications:     amiodarone  200 mg Oral Daily   Chlorhexidine Gluconate Cloth  6 each Topical Q0600   darbepoetin (ARANESP) injection - NON-DIALYSIS  150 mcg Subcutaneous Q Mon-1800   docusate sodium  100 mg Oral BID   feeding supplement  237 mL Oral TID BM   mouth rinse  15 mL Mouth Rinse BID   midodrine  5 mg Oral Once per day on Mon Wed Fri   mirtazapine  7.5 mg Oral QHS   montelukast  5 mg Oral QHS   multivitamin  1 tablet Oral QHS   pantoprazole  40 mg Oral Daily   rosuvastatin  10 mg Oral Daily   senna  2 tablet Oral QHS   sildenafil  20 mg Oral TID   sodium chloride flush  10-40 mL Intracatheter Q12H   sodium chloride flush  3 mL Intravenous Q12H   Warfarin - Pharmacist Dosing Inpatient   Does not  apply [facility]

## 2021-10-22 NOTE — Progress Notes (Addendum)
Patient ID: Isaac [name], male   DOB: [birth year], 71 y.o.   MRN: [id number]    Advanced Heart Failure VAD Team Note  PCP-Cardiologist: None   Subjective:    12/19 RHC- RA 7, PA 39/14 (25), PCWP 11, CO 6.4 CI 3. Thermo 3.5 1.8. Lasix drip stopped after cath. 12/20 Swan removed.  Norepi 3 mcg added.  12/21 Fever. Blood CX drawn. UA + leukocytes. Started on vanc and cefepime. Diuresed with IV lasix + metolazone.  12/23 Impella 5.5 placed 12/28 HM III LVAD, ICD leads plastered to tricuspid valve with severe TR, valve not replaced and some improvement in TR with LVAD placement.  12/29 Extubated 01/03 Started CRRT 01/07  L thoracentesis and placement of [facility] 01/09 LVAD speed increased to 5600 rpm 01/10 Milrinone stopped. Amio gtt>>PO. Completed 5 day course Cefepime.  01/12 Ramp echo done: Moderate lateral pericardial effusion w/o tamponade, RV larger with severe dilation/moderate dysfunction and left shift of the septum, LV small and undershifted.  TR severe.  LVAD speed decreased to 5000 rpms at 2 different sessions.  Also had thoracentesis on left with 1.3 L off.  01/13 TFs begun 01/16 echo reviewed: EF 25-30%, LV larger than prior but septum still with left shift, RV moderately dilated and moderately dysfunctional, moderate-severe TR, mod-large localized pericardial effusion lateral to the LV.  1/18 Ramp Echo with speed decreased to 4900  1/21 CVVHD stopped 1/23 delay in iHD due to staffing, developed increased dyspnea + volume overload CVP >20 and low flow alarms. VAD speed reduced to 4800 then increased back to 5000. CVVH restarted. Milrinone restarted 0.25.  1/25 CVVH stopped 1/26 Tolerated initial iHD.  1/27 Echo done, speed increased to 5200 rpm. Stopped milrinone.  1/29 Midodrine stopped. Sildenafil 20 tid started  Co-ox 86.5%. Tolerating iHD  INR 1.9 today, hgb 8,4.  LDH 173  Denies SOB. No complaints.   LVAD INTERROGATION:  HeartMate III LVAD:   Flow 4.1 liters/min,  speed 5200, power 4 PI 3.6 .  No PI events overnight.  VAD interrogated personally. Parameters stable.     Objective:    Vital Signs:   Temp:  [98 F (36.7 C)-98.5 F (36.9 C)] 98 F (36.7 C) (02/01 0346) Pulse Rate:  [60-66] 66 (02/01 0346) Resp:  [18-20] 20 (02/01 0346) BP: (83-100)/(65-83) 90/68 (02/01 0400) SpO2:  [93 %-94 %] 94 % (02/01 0346) Weight:  [69 kg] 69 kg (02/01 0346) Last BM Date: 10/21/21 Mean arterial Pressure 70-80s    Intake/Output:   Intake/Output Summary (Last 24 hours) at 10/22/2021 0722 Last data filed at 10/22/2021 0347 Gross per 24 hour  Intake 613 ml  Output 2 ml  Net 611 ml     Physical Exam   Physical Exam: GENERAL: No acute distress. HEENT: normal  NECK: Supple, JVP 5-6 .  2+ bilaterally, no bruits.  No lymphadenopathy or thyromegaly appreciated.   CARDIAC:  Mechanical heart sounds with LVAD hum present. R upper chest tunneled HD LUNGS:  Clear to auscultation bilaterally.  ABDOMEN:  Soft, round, nontender, positive bowel sounds x4.     LVAD exit site: well-healed and incorporated.  Dressing dry and intact.  No erythema or drainage.  Stabilization device present and accurately applied.  Driveline dressing is being changed daily per sterile technique. EXTREMITIES:  Warm and dry, no cyanosis, clubbing, rash or edema . RUE PICC NEUROLOGIC:  Alert and oriented x 3.    No aphasia.  No dysarthria.  Affect pleasant.      Telemetry  A Paced 60s   Labs   Basic Metabolic Panel: Recent Labs  Lab 10/18/21 0440 10/19/21 0337 10/20/21 0458 10/20/21 1451 10/21/21 0405 10/22/21 0516 10/22/21 0517  NA 136 136   137 134* 136 136  --  135  K 4.5 3.9   3.9 4.0 3.2* 3.7  --  3.5  CL 99 99   100 97* 100 100  --  100  CO2 24 26   26 25 26 26   --  24  GLUCOSE 87 78   78 103* 95 95  --  100*  BUN 31* 19   19 29* 12 20  --  30*  CREATININE 3.93* 3.23*   3.22* 4.56* 2.70* 3.69*  --  4.77*  CALCIUM 8.6* 8.5*   8.6* 8.5* 8.8* 8.6*  --  8.5*  MG 2.1 1.9  2.0  --  1.8 1.8  --   PHOS 4.9* 4.6  --  3.4 4.7*  --  5.2*    Liver Function Tests: Recent Labs  Lab 10/18/21 0440 10/19/21 0337 10/20/21 1451 10/21/21 0405 10/22/21 0517  ALBUMIN 2.0* 2.1* 2.5* 2.2* 2.2*   No results for input(s): LIPASE, AMYLASE in the last 168 hours. No results for input(s): AMMONIA in the last 168 hours.   CBC: Recent Labs  Lab 10/18/21 0440 10/19/21 0337 10/20/21 0458 10/21/21 0405 10/22/21 0516  WBC 10.4 9.3 8.8 7.6 8.3  HGB 8.8* 9.0* 8.6* 8.0* 8.4*  HCT 28.2* 28.7* 27.2* 26.0* 27.0*  MCV 94.3 95.3 93.2 94.2 94.4  PLT 604* 590* 523* 440* 412*    INR: Recent Labs  Lab 10/18/21 0440 10/19/21 0337 10/20/21 0458 10/21/21 0405 10/22/21 0516  INR 1.7* 1.6* 1.7* 1.9* 1.9*    Other results:   Imaging   No results found.   Medications:     Scheduled Medications:  amiodarone  200 mg Oral Daily   Chlorhexidine Gluconate Cloth  6 each Topical Q0600   darbepoetin (ARANESP) injection - NON-DIALYSIS  150 mcg Subcutaneous Q Mon-1800   docusate sodium  100 mg Oral BID   feeding supplement  237 mL Oral TID BM   mouth rinse  15 mL Mouth Rinse BID   midodrine  5 mg Oral Once per day on Mon Wed Fri   mirtazapine  7.5 mg Oral QHS   montelukast  5 mg Oral QHS   multivitamin  1 tablet Oral QHS   pantoprazole  40 mg Oral Daily   rosuvastatin  10 mg Oral Daily   senna  2 tablet Oral QHS   sildenafil  20 mg Oral TID   sodium chloride flush  10-40 mL Intracatheter Q12H   sodium chloride flush  3 mL Intravenous Q12H   Warfarin - Pharmacist Dosing Inpatient   Does not apply q1600    Infusions:  sodium chloride Stopped (09/18/21 0803)   sodium chloride Stopped (10/02/21 0900)   sodium chloride     sodium chloride     promethazine (PHENERGAN) injection (IM or IVPB) Stopped (10/10/21 1755)    PRN Medications: sodium chloride, sodium chloride, sodium chloride, acetaminophen, albuterol, alteplase, Gerhardt's butt cream, heparin, heparin,  lidocaine (PF), lidocaine-prilocaine, lip balm, ondansetron (ZOFRAN) IV, oxyCODONE, pentafluoroprop-tetrafluoroeth, polyethylene glycol, promethazine (PHENERGAN) injection (IM or IVPB), sodium chloride flush, sodium chloride flush, traMADol   Assessment/Plan:    1. Acute on chronic systolic CHF:  Long-standing cardiomyopathy.  [place].  Echo this admission with EF < 20%, severe LV dilation, restrictive diastolic function, moderate RV dysfunction,  moderate MR, mod-severe TR. Cause of cardiomyopathy is uncertain.  He has a history of CAD, but I do not think that the described CAD from the past could explain his cardiomyopathy, but CAD could have progressed.  With difficulty tolerating GDMT/need for midodrine and cardiorenal syndrome as well as profound volume overload + NYHA class IV symptoms,  concerned for low output HF. Co-ox off milrinone was 36%, milrinone started and increased to 0.375 but CO remained low. NE added and Impella 5.5 placed 12/23. EF 10% on TEE 12/23. s/p HM III VAD on 12/28.  Ramp echo 01/03 with EF 20-25%, RV moderately reduced, septum mid-line.  Ramp echo again on 1/12 showed severely dilated/moderately dysfunctional RV with severe TR and left-shifted septum with small LV.  Speed decreased on 1/12 to 5000 rpm over 2 sessions due to RV failure and milrinone 0.25 + NE 3 restarted.  Echo repeated on 1/16, RV looks better and LV larger but still with left shift of the septum.  Able to increase CVVH, weight down significantly.  Echo again on 1/18, speed decreased to 4900 with RV mildly dysfunctional and severely enlarged with severe TR.  CVVHD stopped 1/20.  1/23 delay in iHD due to staffing, developed increased dyspnea + volume overload CVP >20 and low flow alarms. VAD speed reduced to 4800. CVVH restarted. Milrinone restarted 0.25. Speed increased back to 5000 rpm and then to 5200.  Volume much improved. CVVH stopped 1/25 and now getting iHD.  Milrinone has been stopped, he is  also off midodrine.  - CO-OX stable. D/C daily CO-OX and CVP. Stable LVAD parameters.  - Continue midodrine 5 mg MWF pre-HD.  - Next HD planned for tomorrow.    - Continue  sildenafil 20 tid for RV.   - Warfarin for INR 2-2.5.  INR 1.9 today Discussed dosing with PharmD personally.  - Continue 81 mg aspirin 2. Tricuspid regurgitation:  Tricuspid repair not done at time of VAD d/t proximity of ICD wires and hypotension during surgery.  He has severe TR.  3. Atrial fibrillation: IN SR - Continue amiodarone to 200 mg daily . 4. AKI on CKD stage 3: Gradual rise (was about 1.7 pre-op) since surgery.  He has not been hypotensive post-op, suspect intra-op hypotension led to development of ATN => urine sediment looked like ATN per renal. Got CVVH. TDC placed 1/7. Now off CVVH and now with iHD, tolerating so far.  - Tolerating iHD - Will give midodrine on the mornings of HD.   5. CAD: History of PCI to OM1 in 2007 and RCA in 2013.  No CP or ACs.  - Deferred cardiac cath due to AKI and plan for VAD - No chest pain.  - Continue Crestor.  6. ID: Had fever 12/21, PCT 1.03.  Blood cultures negative. ?Phlebitis at IV site. Post-op fever with elevated WBCs, PCT 2.63 -> 1.78.  He completed course of cefepime.  - Resolved.  7. Constipation: Resolved.  8. Hyponatremia: Hypervolemic hyponatremia.  Controlled via dialysis.  9. Anemia: 1 unit PRBCs on 1/12,  1 unit PRBs 1/13, 1 unit 1/16. No overt bleeding, ?losses via CVVH.  Hgb 8.4 today, no evidence bleeding - Transfuse for Hgb <7.5  - Received IV iron x 5 doses per nephrology (finished 1/20) 10. Confusion/delirium: Initially post-op and started on Precedex.  Resolved.  11. F/E/N: Improving po intake.  - Added remeron to help with appetite.  12. Left pleural effusion: - s/p thoracentesis 1/7 - s/p thoracentesis 1/12. 13. Pericardial  effusion: Stable moderate pericardial effusion, primarily localized to along the lateral wall of the LV.  Does not appear to  be affecting the RV/doubt tamponade.  Discussed after 1/12 and 1/18 echo with Dr. Cyndia Bent, conservative management for now.  14. Right great toe pain: - ? Possible gout.  15. Hyperkalemia - Resolved w/ HD and lokelma.  16. Deconditioning:  - Improving, plan for home health/PT.   Discussed home medications. He is ok to use TOC clinic.   LVAD teaching today, working on getting him a seat at dialysis center in [place].  Hopefully can get him home Thursday then to HD outpatient Friday.    Amy Clegg NP-C  10/22/2021 7:22 AM  Patient seen with NP, agree with the above note.   No complaints, stable LVAD parameters. MAP 70s.  Walking without difficulty.   General: Well appearing this am. NAD.  HEENT: Normal. Neck: Supple, JVP 8-9 cm. Carotids OK.  Cardiac:  Mechanical heart sounds with LVAD hum present.  Lungs:  CTAB, normal effort.  Abdomen:  NT, ND, no HSM. No bruits or masses. +BS  LVAD exit site: Well-healed and incorporated. Dressing dry and intact. No erythema or drainage. Stabilization device present and accurately applied. Driveline dressing changed daily per sterile technique. Extremities:  Warm and dry. No cyanosis, clubbing, rash, or edema.  Neuro:  Alert & oriented x 3. Cranial nerves grossly intact. Moves all 4 extremities w/o difficulty. Affect pleasant    Patient will have HD today, will need midodrine pre-HD.    Working on outpatient dialysis seat.   I would like to get him home tomorrow if outpatient HD can be arranged.   Loralie Champagne 10/22/2021 8:17 AM

## 2021-10-22 NOTE — Plan of Care (Signed)
Discussed with patient plan of care for the evening, pain management and bedtime medications with some teach back displayed.  Problem: Health Behavior/Discharge Planning: Goal: Ability to manage health-related needs will improve Outcome: Progressing   Problem: Education: Goal: Ability to demonstrate management of disease process will improve Outcome: Progressing

## 2021-10-22 NOTE — Patient Instructions (Addendum)
Outpatient dialysis treatment is scheduled Monday, 09/26/21. Please arrive at 10:30 in main Lobby with HD scheduled at 11:20. A VAD Coordinator will meet you and your wife at the center for staff education.     [facility]  [address] [place], [place] [address] [phone]  2.   We scheduled you an appointment in the [facility] for maintenance of your [facility] [employer] device. Appointment is scheduled for Tuesday March 14th at 2:15 pm with Dr Caryl Comes.   [facility] [address] [place], [place] [phone]

## 2021-10-22 NOTE — Progress Notes (Signed)
Pt has Product/process development scientist. Pre-VAD implant pt's wife made [place] aware that pt has not been seen in a device clinic since they relocated to [place] 10 years ago. Scheduled pt appointment in the [facility] on Tuesday March 14th at 2:15 pm with Dr Caryl Comes. Provided pt with office address and phone number on hospital discharge AVS. [address], [place], [place] [address] [phone].   Emerson Monte RN [place] Coordinator  Office: [phone]  24/7 Pager: [phone]

## 2021-10-22 NOTE — Progress Notes (Signed)
Outpatient Heart Failure CSW continuing to follow during implant hospitalization.  CSW collaborating with LVAD coordinators and no needs identified at this time- continuing to follow and assist as needed.   Jorge Ny, LCSW Clinical Social Worker Advanced Heart Failure Clinic Desk#: [phone] Cell#: [phone]

## 2021-10-22 NOTE — Progress Notes (Addendum)
Contacted [facility] admissions and Joy regarding MD desire for d/c tomorrow. Will await response regarding acceptance, schedule, and plans for training staff regarding pt's LVAD. Message left for LVAD team as well requesting a return call to discuss the above info.   Melven Sartorius Renal Navigator [phone]  Addendum at 4:03 pm: Pt has been approved for [facility] on MWF schedule. Pt can start on Monday. Pt will need to arrive at 10:35 on Monday for 11:20 chair time. Clinic manager aware pt to have an LVAD and all clinic training will be completed by Monday. Pt currently off the floor. Contacted pt's wife via phone to discuss out-pt HD arrangements. Pt's wife agreeable to plan. Navigator to provide schedule letter to pt and pt's wife tomorrow. Update provided to Dr Aundra Dubin and Dr Joylene Grapes. Arrangements documented on AVS as well. Will assist as needed.

## 2021-10-22 NOTE — Progress Notes (Signed)
Nutrition Follow-up  DOCUMENTATION CODES:   Severe malnutrition in context of chronic illness  INTERVENTION:   - Continue liberalized diet  - Continue Ensure Enlive po TID, each supplement provides 350 kcal and 20 grams of protein (RN may change order to pt preferred supplement)  - Continue Remeron  - Continue renal MVI daily  - Continue to encourage PO intake  NUTRITION DIAGNOSIS:   Severe Malnutrition related to chronic illness (heart failure) as evidenced by severe fat depletion, severe muscle depletion.  Ongoing, being addressed via liberalized diet and ONS  GOAL:   Patient will meet greater than or equal to 90% of their needs  Progressing  MONITOR:   PO intake, Supplement acceptance, Labs, Weight trends  REASON FOR ASSESSMENT:   Consult LVAD Eval  ASSESSMENT:   71 yo male admitted with acute on chronic CHF. PMH includes CHF, CAD, CKD, HLD, HTN  12/23 - Impella 5.5 placed 12/28 - HM3 LVAD plaecd, Impella removed, Intubated 12/29 - extubated 12/31 - FL diet 01/02 - diet advanced to 2g sodium 01/03 - CRRT initiated 01/11 - calorie count indicating inadequate oral intake, Cortrak recommended 01/12 - thoracentesis with 1.3 L removed 01/13 - Cortrak placed, TF initiated 01/19 - Cortrak removed, TF stopped 01/20 - CRRT on hold 01/23 - plan for iHD but not available so back on CRRT  Pt in iHD at time of RD visit. Last iHD was on 10/20/21 with 526 ml net UF. Post-HD weight was 71.8 kg. Today's pre-HD weight was 69.3 kg. Per notes, pt has been tolerating iHD fairly well. Pt has required IV albumin with HD. Per Nephrology, pt approaching ESRD.  Noted plan for pt to d/c on 10/24/21 after iHD.  Pt with elevated phosphorus, slowly trending up. Recommend initiation of binder therapy per Nephrology discretion. Do NOT recommend any diet restrictions at this time.  Admit weight: 78.2 kg Current weight: 69.3 kg  Meal Completion: 25-100% (averaging 72%)  Medications  reviewed and include: aranesp weekly, colace, Ensure Enlive TID, remeron, rena-vit, protonix, senna, warfarin, IV albumin with HD today  Labs reviewed: BUN 30, creatinine 4.77, phosphorus 5.7, hemoglobin 8.4 CBG's: 79-111 x 24 hours  I/O's: -35.7 L since admit  Diet Order:   Diet Order             Diet vegetarian Room service appropriate? Yes; Fluid consistency: Thin  Diet effective now                   EDUCATION NEEDS:   Education needs have been addressed  Skin:  Skin Assessment: Skin Integrity Issues: Stage II: sacrum-now healing per RN Incisions: new LVAD  Last BM:  10/21/21  Height:   Ht Readings from Last 1 Encounters:  10/09/21 5\' 9"  (1.753 m)    Weight:   Wt Readings from Last 1 Encounters:  10/22/21 69.3 kg    BMI:  Body mass index is 22.56 kg/m.  Estimated Nutritional Needs:   Kcal:  2300-2500 kcals  Protein:  135-160 grams  Fluid:  1000 mL plus UOP    Gustavus Bryant, MS, RD, LDN Inpatient Clinical Dietitian Please see AMiON for contact information.

## 2021-10-22 NOTE — Progress Notes (Signed)
[place] for Warfarin Indication: LVAD/AF  Allergies  Allergen Reactions   Mushroom Extract Complex Nausea And Vomiting   Neosporin [Neomycin-Bacitracin Zn-Polymyx] Hives    Patient Measurements: Height: 5\' 9"  (175.3 cm) Weight: 69.3 kg (152 lb 12.5 oz) IBW/kg (Calculated) : 70.7 Heparin Dosing Weight: 83.7 kg  Vital Signs: Temp: 98.4 F (36.9 C) (02/01 1322) Temp Source: Oral (02/01 1322) BP: (P) 79/62 (02/01 1430) Pulse Rate: (P) 60 (02/01 1430)  Labs: Recent Labs    10/20/21 0458 10/20/21 1451 10/21/21 0405 10/22/21 0516 10/22/21 0517  HGB 8.6*  --  8.0* 8.4*  --   HCT 27.2*  --  26.0* 27.0*  --   PLT 523*  --  440* 412*  --   LABPROT 20.0*  --  21.5* 21.6*  --   INR 1.7*  --  1.9* 1.9*  --   CREATININE 4.56* 2.70* 3.69*  --  4.77*     Estimated Creatinine Clearance: 14.3 mL/min (A) (by C-G formula based on SCr of 4.77 mg/dL (H)).   Medical History: Past Medical History:  Diagnosis Date   Arrhythmia    atrial fibrillation   CHF (congestive heart failure) (HCC)    Chronic kidney disease    Coronary artery disease    Hyperlipidemia    Hypertension    Myocardial infarct [facility])     Assessment: 71 yo male on chronic Eliquis PTA for afib.  Now s/p LVAD placement 09/17/21, pharmacy asked to begin anticoagulation with warfarin.   Patient previously therapeutic on lower doses 1-2mg  daily prior to tube feed being started for poor nutritional intake.   INR now trending up patient was very sensitive in the past. CBC and LDH stable. No signs or symptoms of bleeding.  Expect discharge warfarin dose to be closer to 2mg  daily at discharge.   Amiodarone dose reduced to 200mg  daily on 1/29.  Goal of Therapy:  INR 2-2.5 Monitor platelets by anticoagulation protocol: Yes   Plan:  -Warfarin 3 mg PO x1 today  -Daily INR, CBC, LDH  Erin Hearing PharmD., BCPS Clinical Pharmacist 10/22/2021 3:13 PM

## 2021-10-23 LAB — RENAL FUNCTION PANEL
Albumin: 2.5 g/dL — ABNORMAL LOW (ref 3.5–5.0)
Anion gap: 10 (ref 5–15)
BUN: 18 mg/dL (ref 8–23)
CO2: 27 mmol/L (ref 22–32)
Calcium: 8.7 mg/dL — ABNORMAL LOW (ref 8.9–10.3)
Chloride: 100 mmol/L (ref 98–111)
Creatinine, Ser: 3.4 mg/dL — ABNORMAL HIGH (ref 0.61–1.24)
GFR, Estimated: 19 mL/min — ABNORMAL LOW (ref >60)
Glucose, Bld: 77 mg/dL (ref 70–99)
Phosphorus: 3.7 mg/dL (ref 2.5–4.6)
Potassium: 4.2 mmol/L (ref 3.5–5.1)
Sodium: 137 mmol/L (ref 135–145)

## 2021-10-23 LAB — GLUCOSE, CAPILLARY
Glucose-Capillary: 81 mg/dL (ref 70–99)
Glucose-Capillary: 74 mg/dL (ref 70–99)
Glucose-Capillary: 73 mg/dL (ref 70–99)
Glucose-Capillary: 91 mg/dL (ref 70–99)
Glucose-Capillary: 104 mg/dL — ABNORMAL HIGH (ref 70–99)

## 2021-10-23 LAB — LACTATE DEHYDROGENASE: LDH: 178 U/L (ref 98–192)

## 2021-10-23 LAB — PROTIME-INR
INR: 1.8 — ABNORMAL HIGH (ref 0.8–1.2)
Prothrombin Time: 21.1 seconds — ABNORMAL HIGH (ref 11.4–15.2)

## 2021-10-23 LAB — CBC
HCT: 27.4 % — ABNORMAL LOW (ref 39.0–52.0)
Hemoglobin: 8.5 g/dL — ABNORMAL LOW (ref 13.0–17.0)
MCH: 29.3 pg (ref 26.0–34.0)
MCHC: 31 g/dL (ref 30.0–36.0)
MCV: 94.5 fL (ref 80.0–100.0)
Platelets: 384 10*3/uL (ref 150–400)
RBC: 2.9 MIL/uL — ABNORMAL LOW (ref 4.22–5.81)
RDW: 17.5 % — ABNORMAL HIGH (ref 11.5–15.5)
WBC: 6.5 10*3/uL (ref 4.0–10.5)
nRBC: 0 % (ref 0.0–0.2)

## 2021-10-23 LAB — MAGNESIUM: Magnesium: 1.9 mg/dL (ref 1.7–2.4)

## 2021-10-23 MED ORDER — WARFARIN SODIUM 4 MG PO TABS
4.0000 mg | ORAL_TABLET | Freq: Once | ORAL | Status: AC
  Administered 2021-10-23: 4 mg via ORAL
  Filled 2021-10-23: qty 1

## 2021-10-23 NOTE — Progress Notes (Signed)
Patient has poor appetite today. He skipped breakfast and lunch. Writer spoke with him about importance of eating and that blood sugars have been running low today, low 70s in a.m. and noon. He does say this has only been today and he just does not feel much like eating. He does not have nausea, stomach pain or anything he can pinpoint to account for, just generally does not look appealing. He did agree to drink his afternoon Ensure, and shows good understanding of conversation.

## 2021-10-23 NOTE — Progress Notes (Signed)
Physical Therapy Treatment Patient Details Name: Isaac [name] MRN: [id number] DOB: [birth year] Today's Date: 10/23/2021   History of Present Illness 71 y.o. male admitted 09/07/21 with near-syncope, nausea, poor oral intake, generalized weakness. Workup for advanced heart failure and persistent inotropic with requirement, cardiogenic shock. [place] 12/19. Impella placed on 12/23. LVAD placed 12/28. Pt with post-op delirium and agitation. CRRT initiated 1/3. S/p thoracentesis 1/12. S/p cortrak placement 1/13. CRRT stopped 1/20.  Pt back on CRRT on 1/23 due to staffing of HD. PMH includes advanced CHF (LVEF <20%), AICD/PPM, PAF on Eliquis, CKD 3, HTN, HLD.    PT Comments    Patient progressing well towards PT goals. Session focused on stair training and gait training with use of rollator. Pt continues to require cues to adhere to sternal precautions during mobility. A few instances of vtach during activity but pt asymptomatic; HR up to 145 bpm with activity. 1 long seated rest break post stairs due to fatigue and SOB. Plans to d/c home tomorrow with support of wife. Will follow.    Recommendations for follow up therapy are one component of a multi-disciplinary discharge planning process, led by the attending physician.  Recommendations may be updated based on patient status, additional functional criteria and insurance authorization.  Follow Up Recommendations  Home health PT     Assistance Recommended at Discharge Frequent or constant Supervision/Assistance  Patient can return home with the following A little help with walking and/or transfers;Help with stairs or ramp for entrance;A little help with bathing/dressing/bathroom;Assistance with cooking/housework;Assist for transportation   Equipment Recommendations  Rollator (4 wheels);BSC/3in1    Recommendations for Other Services       Precautions / Restrictions Precautions Precautions: Fall;Sternal;Other (comment) Precaution Booklet  Issued: No Precaution Comments: LVAD Restrictions Weight Bearing Restrictions: Yes Other Position/Activity Restrictions: Sternal Precautions     Mobility  Bed Mobility Overal bed mobility: Needs Assistance Bed Mobility: Supine to Sit     Supine to sit: Supervision, HOB elevated Sit to supine: Supervision, HOB elevated   General bed mobility comments: Supervision for safety. Cues to adhere from using UEs to pull up.    Transfers Overall transfer level: Needs assistance Equipment used: Rollator (4 wheels) Transfers: Sit to/from Stand Sit to Stand: Supervision           General transfer comment: Supervision for safety, stood from EOB x1, from rollator seat x1, cues to place UEs on knees.    Ambulation/Gait Ambulation/Gait assistance: Min guard Gait Distance (Feet): 250 Feet (x2 bouts) Assistive device: Rollator (4 wheels) Gait Pattern/deviations: Step-through pattern, Decreased stride length Gait velocity: decr     General Gait Details: Slow, steady gait with cues for rollator usage re how to unlock/lock brakes etc. No dizziness. 1 seated rest break after stairs. 2/4 DOE.   Stairs Stairs: Yes Stairs assistance: Min assist, Min guard Stair Management: One rail Left, Step to pattern Number of Stairs: 5 General stair comments: Cues for step to pattern as pt wanting to do step through but difficulty ascending LE and needed to use UEs to push through thigh. Min A for balance/stability. Uncontrolled descent of LEs when coming down stairs.   Wheelchair Mobility    Modified Rankin (Stroke Patients Only)       Balance Overall balance assessment: Needs assistance Sitting-balance support: No upper extremity supported, Feet supported Sitting balance-Leahy Scale: Good     Standing balance support: During functional activity Standing balance-Leahy Scale: Fair Standing balance comment: ABle to stand statically wtihout UE support but  needs UE support for walking                             Cognition Arousal/Alertness: Awake/alert Behavior During Therapy: WFL for tasks assessed/performed Overall Cognitive Status: Impaired/Different from baseline Area of Impairment: Memory, Problem solving                     Memory: Decreased recall of precautions Following Commands: Follows one step commands consistently     Problem Solving: Requires verbal cues General Comments: Requires cues to adhere to sternal precautions during mobility; able to problem solve how to change over from power to batteries with Min cues. Not forthcoming with info, always "I am fine."        Exercises      General Comments General comments (skin integrity, edema, etc.): Requires min cues to change power to/from battery; Able to remember to bring black bag and stock appropriately.      Pertinent Vitals/Pain Pain Assessment Pain Assessment: No/denies pain    Home Living                          Prior Function            PT Goals (current goals can now be found in the care plan section) Progress towards PT goals: Progressing toward goals    Frequency    Min 3X/week      PT Plan Current plan remains appropriate    Co-evaluation              AM-PAC PT "6 Clicks" Mobility   Outcome Measure  Help needed turning from your back to your side while in a flat bed without using bedrails?: A Little Help needed moving from lying on your back to sitting on the side of a flat bed without using bedrails?: A Little Help needed moving to and from a bed to a chair (including a wheelchair)?: A Little Help needed standing up from a chair using your arms (e.g., wheelchair or bedside chair)?: A Little Help needed to walk in hospital room?: A Little Help needed climbing 3-5 steps with a railing? : A Little 6 Click Score: 18    End of Session Equipment Utilized During Treatment: Gait belt Activity Tolerance: Patient tolerated treatment  well Patient left: in bed;with call bell/phone within reach;with bed alarm set Nurse Communication: Mobility status PT Visit Diagnosis: Unsteadiness on feet (R26.81);Other abnormalities of gait and mobility (R26.89);Muscle weakness (generalized) (M62.81)     Time: 9629-5284 PT Time Calculation (min) (ACUTE ONLY): 30 min  Charges:  $Gait Training: 8-22 mins $Therapeutic Activity: 8-22 mins                     Marisa Severin, PT, DPT Acute Rehabilitation Services Pager [phone] Office Scraper 10/23/2021, 12:10 PM

## 2021-10-23 NOTE — Progress Notes (Signed)
Patient ID: Isaac [name], male   DOB: Feb 17, 1951, 71 y.o.   MRN: [id number]    Advanced Heart Failure VAD Team Note  PCP-Cardiologist: None   Subjective:    12/19 RHC- RA 7, PA 39/14 (25), PCWP 11, CO 6.4 CI 3. Thermo 3.5 1.8. Lasix drip stopped after cath. 12/20 Swan removed.  Norepi 3 mcg added.  12/21 Fever. Blood CX drawn. UA + leukocytes. Started on vanc and cefepime. Diuresed with IV lasix + metolazone.  12/23 Impella 5.5 placed 12/28 HM III LVAD, ICD leads plastered to tricuspid valve with severe TR, valve not replaced and some improvement in TR with LVAD placement.  12/29 Extubated 01/03 Started CRRT 01/07  L thoracentesis and placement of [facility] 01/09 LVAD speed increased to 5600 rpm 01/10 Milrinone stopped. Amio gtt>>PO. Completed 5 day course Cefepime.  01/12 Ramp echo done: Moderate lateral pericardial effusion w/o tamponade, RV larger with severe dilation/moderate dysfunction and left shift of the septum, LV small and undershifted.  TR severe.  LVAD speed decreased to 5000 rpms at 2 different sessions.  Also had thoracentesis on left with 1.3 L off.  01/13 TFs begun 01/16 echo reviewed: EF 25-30%, LV larger than prior but septum still with left shift, RV moderately dilated and moderately dysfunctional, moderate-severe TR, mod-large localized pericardial effusion lateral to the LV.  1/18 Ramp Echo with speed decreased to 4900  1/21 CVVHD stopped 1/23 delay in iHD due to staffing, developed increased dyspnea + volume overload CVP >20 and low flow alarms. VAD speed reduced to 4800 then increased back to 5000. CVVH restarted. Milrinone restarted 0.25.  1/25 CVVH stopped 1/26 Tolerated initial iHD.  1/27 Echo done, speed increased to 5200 rpm. Stopped milrinone.  1/29 Midodrine stopped. Sildenafil 20 tid started  HD yesterday with no problems per patient's report.  No complaints today.  Has been walking in hall.   INR 1.8 today, hgb 8.5.  LDH 178  LVAD INTERROGATION:   HeartMate III LVAD:   Flow 4.0 liters/min, speed 5200, power 3.6 PI 4.5.  PI events associated with HD yesterday.  VAD interrogated personally. Parameters stable.     Objective:    Vital Signs:   Temp:  [97.2 F (36.2 C)-98.6 F (37 C)] 98 F (36.7 C) (02/02 0714) Pulse Rate:  [50-231] 181 (02/02 0714) Resp:  [12-28] 24 (02/02 0714) BP: (74-110)/(47-88) 99/64 (02/02 0714) SpO2:  [90 %-99 %] 95 % (02/02 0714) Weight:  [69.3 kg-69.4 kg] 69.3 kg (02/02 0619) Last BM Date: 10/21/21 Mean arterial Pressure 70s-80s    Intake/Output:   Intake/Output Summary (Last 24 hours) at 10/23/2021 0827 Last data filed at 10/23/2021 0400 Gross per 24 hour  Intake 383 ml  Output -74 ml  Net 457 ml     Physical Exam   General: Well appearing this am. NAD.  HEENT: Normal. Neck: Supple, JVP 8 cm. Carotids OK.  Cardiac:  Mechanical heart sounds with LVAD hum present.  Lungs:  CTAB, normal effort.  Abdomen:  NT, ND, no HSM. No bruits or masses. +BS  LVAD exit site: Well-healed and incorporated. Dressing dry and intact. No erythema or drainage. Stabilization device present and accurately applied. Driveline dressing changed daily per sterile technique. Extremities:  Warm and dry. No cyanosis, clubbing, rash, or edema.  Neuro:  Alert & oriented x 3. Cranial nerves grossly intact. Moves all 4 extremities w/o difficulty. Affect pleasant    Telemetry    A Paced 60s (personally reviewed)  Labs   Basic Metabolic  Panel: Recent Labs  Lab 10/19/21 0337 10/20/21 0458 10/20/21 1451 10/21/21 0405 10/22/21 0516 10/22/21 0517 10/23/21 0657 10/23/21 0658  NA 136   137 134* 136 136  --  135  --  137  K 3.9   3.9 4.0 3.2* 3.7  --  3.5  --  4.2  CL 99   100 97* 100 100  --  100  --  100  CO2 26   26 25 26 26   --  24  --  27  GLUCOSE 78   78 103* 95 95  --  100*  --  77  BUN 19   19 29* 12 20  --  30*  --  18  CREATININE 3.23*   3.22* 4.56* 2.70* 3.69*  --  4.77*  --  3.40*  CALCIUM 8.5*   8.6* 8.5*  8.8* 8.6*  --  8.5*  --  8.7*  MG 1.9 2.0  --  1.8 1.8  --  1.9  --   PHOS 4.6  --  3.4 4.7*  --  5.2*  --  3.7    Liver Function Tests: Recent Labs  Lab 10/19/21 0337 10/20/21 1451 10/21/21 0405 10/22/21 0517 10/23/21 0658  ALBUMIN 2.1* 2.5* 2.2* 2.2* 2.5*   No results for input(s): LIPASE, AMYLASE in the last 168 hours. No results for input(s): AMMONIA in the last 168 hours.   CBC: Recent Labs  Lab 10/19/21 0337 10/20/21 0458 10/21/21 0405 10/22/21 0516 10/23/21 0657  WBC 9.3 8.8 7.6 8.3 6.5  HGB 9.0* 8.6* 8.0* 8.4* 8.5*  HCT 28.7* 27.2* 26.0* 27.0* 27.4*  MCV 95.3 93.2 94.2 94.4 94.5  PLT 590* 523* 440* 412* 384    INR: Recent Labs  Lab 10/19/21 0337 10/20/21 0458 10/21/21 0405 10/22/21 0516 10/23/21 0657  INR 1.6* 1.7* 1.9* 1.9* 1.8*    Other results:   Imaging   No results found.   Medications:     Scheduled Medications:  amiodarone  200 mg Oral Daily   Chlorhexidine Gluconate Cloth  6 each Topical Q0600   darbepoetin (ARANESP) injection - NON-DIALYSIS  150 mcg Subcutaneous Q Mon-1800   docusate sodium  100 mg Oral BID   feeding supplement  237 mL Oral TID BM   mouth rinse  15 mL Mouth Rinse BID   midodrine  5 mg Oral Once per day on Mon Wed Fri   mirtazapine  7.5 mg Oral QHS   montelukast  5 mg Oral QHS   multivitamin  1 tablet Oral QHS   pantoprazole  40 mg Oral Daily   rosuvastatin  10 mg Oral Daily   senna  2 tablet Oral QHS   sildenafil  20 mg Oral TID   sodium chloride flush  10-40 mL Intracatheter Q12H   sodium chloride flush  3 mL Intravenous Q12H   Warfarin - Pharmacist Dosing Inpatient   Does not apply q1600    Infusions:  sodium chloride Stopped (09/18/21 0803)   sodium chloride Stopped (10/02/21 0900)   sodium chloride     sodium chloride     promethazine (PHENERGAN) injection (IM or IVPB) Stopped (10/10/21 1755)    PRN Medications: sodium chloride, sodium chloride, sodium chloride, acetaminophen, albuterol,  alteplase, Gerhardt's butt cream, heparin, heparin, lidocaine (PF), lidocaine-prilocaine, lip balm, ondansetron (ZOFRAN) IV, oxyCODONE, pentafluoroprop-tetrafluoroeth, polyethylene glycol, promethazine (PHENERGAN) injection (IM or IVPB), sodium chloride flush, sodium chloride flush, traMADol   Assessment/Plan:    1. Acute on chronic systolic CHF:  Long-standing  cardiomyopathy.  [place].  Echo this admission with EF < 20%, severe LV dilation, restrictive diastolic function, moderate RV dysfunction, moderate MR, mod-severe TR. Cause of cardiomyopathy is uncertain.  He has a history of CAD, but I do not think that the described CAD from the past could explain his cardiomyopathy, but CAD could have progressed.  With difficulty tolerating GDMT/need for midodrine and cardiorenal syndrome as well as profound volume overload + NYHA class IV symptoms,  concerned for low output HF. Co-ox off milrinone was 36%, milrinone started and increased to 0.375 but CO remained low. NE added and Impella 5.5 placed 12/23. EF 10% on TEE 12/23. s/p HM III VAD on 12/28.  Ramp echo 01/03 with EF 20-25%, RV moderately reduced, septum mid-line.  Ramp echo again on 1/12 showed severely dilated/moderately dysfunctional RV with severe TR and left-shifted septum with small LV.  Speed decreased on 1/12 to 5000 rpm over 2 sessions due to RV failure and milrinone 0.25 + NE 3 restarted.  Echo repeated on 1/16, RV looks better and LV larger but still with left shift of the septum.  Able to increase CVVH, weight down significantly.  Echo again on 1/18, speed decreased to 4900 with RV mildly dysfunctional and severely enlarged with severe TR.  CVVHD stopped 1/20.  1/23 delay in iHD due to staffing, developed increased dyspnea + volume overload CVP >20 and low flow alarms. VAD speed reduced to 4800. CVVH restarted. Milrinone restarted 0.25. Speed increased back to 5000 rpm and then to 5200.  Volume much improved. CVVH stopped 1/25 and  now getting iHD.  Milrinone has been stopped, he is also off midodrine except for a dose prior to HD.  Stable LVAD parameters. - Continue midodrine 5 mg MWF pre-HD.  - Next HD planned for tomorrow.    - Continue  sildenafil 20 tid for RV.   - Warfarin for INR 2-2.5.  INR 1.8 today, discussed dosing with PharmD personally.  - Continue 81 mg aspirin 2. Tricuspid regurgitation:  Tricuspid repair not done at time of VAD d/t proximity of ICD wires and hypotension during surgery.  He has severe TR.  3. Atrial fibrillation: A-paced. - Continue amiodarone to 200 mg daily . 4. AKI on CKD stage 3: Gradual rise (was about 1.7 pre-op) since surgery.  He has not been hypotensive post-op, suspect intra-op hypotension led to development of ATN => urine sediment looked like ATN per renal. Got CVVH. TDC placed 1/7. Now off CVVH and now with iHD, tolerating so far.  - Tolerating iHD - Will give midodrine on the mornings of HD.   5. CAD: History of PCI to OM1 in 2007 and RCA in 2013.  No CP or ACs.  - Deferred cardiac cath due to AKI and plan for VAD - Continue Crestor.  6. ID: Had fever 12/21, PCT 1.03.  Blood cultures negative. ?Phlebitis at IV site. Post-op fever with elevated WBCs, PCT 2.63 -> 1.78.  He completed course of cefepime.  - Resolved.  7. Constipation: Resolved.  8. Hyponatremia: Hypervolemic hyponatremia.  Controlled via dialysis.  9. Anemia: 1 unit PRBCs on 1/12,  1 unit PRBs 1/13, 1 unit 1/16. No overt bleeding, ?losses via CVVH.  Hgb 8.5 today, no evidence bleeding - Transfuse for Hgb <7.5  - Received IV iron x 5 doses per nephrology (finished 1/20) 10. Confusion/delirium: Initially post-op and started on Precedex.  Resolved.  11. F/E/N: Improving po intake.  - Added remeron to help with appetite.  12. Left pleural effusion: - s/p thoracentesis 1/7 - s/p thoracentesis 1/12. 13. Pericardial effusion: Stable moderate pericardial effusion, primarily localized to along the lateral wall of the  LV.  Does not appear to be affecting the RV/doubt tamponade.  Discussed after 1/12 and 1/18 echo with Dr. Cyndia Bent, conservative management for now.  14. Right great toe pain: - ? Possible gout.  15. Hyperkalemia - Resolved w/ HD and lokelma.  16. Deconditioning:  - Improving, plan for home health/PT.   Should be able to go home after HD tomorrow with 1st outpatient HD on Monday.   Loralie Champagne 10/23/2021 8:27 AM

## 2021-10-23 NOTE — Progress Notes (Signed)
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 Acute component either from cardiorenal +/- ATN with decreased renal function from hemodynamics and CPB + hypotensive episodes + pressors.  - CRRT 1/3-1/20, 1/23-1/25 then iHD starting 1/26. has tunneled HD cath in place.   - Plan to continue HD MWF schedule for now -The patient's baseline creatinine was around 2.  Given he has not had significant renal recovery he most likely is approaching ESRD.  Will be 30 days of renal replacement therapy on 2/3.  I think it would be appropriate to call him ESRD at that time.   Cardiogenic shock: s/p LVAD 09/17/22 (HM 3).   - on  midodrine now, off NE and off milrinone -   UF as able with HD  -Also on sildenafil  CAD with h/o PCI last in 2013  Anemia of CKD: Hgb stable around 8-9. S/p IV iron, on ESA.  Afib- on amio  Acute hypoxic RF: s/p thoracentesis 1.3 L 10/02/21  Hyperkalemia: resolved with RRT  Bones-  phos Ok -  watch for binder need-   PTH 59  Dispo: tolerating HD better, has been accepted for MWF dialysis at [facility] can discharge tomorrow after dialysis.  Subjective:    Patient had some hypotension on dialysis yesterday but patient was asymptomatic.  When we focus more on MAP rather than systolic blood pressure was able to complete dialysis without significant issues.  Patient with no complaints today.    Objective:   BP 99/64    Pulse (!) 181    Temp 98 F (36.7 C) (Oral)    Resp (!) 24    Ht 5\' 9"  (1.753 m)    Wt 69.3 kg    SpO2 95%    BMI 22.56 kg/m   Intake/Output Summary (Last 24 hours) at 10/23/2021 0858 Last data filed at 10/23/2021 0400 Gross per 24 hour  Intake 383 ml  Output -74 ml  Net 457 ml   Weight change: 0.3 kg  Physical Exam: GEN: NAD, alert, pleasant  HEENT: wearing glasses, no nasal discharge, moist mucous membranes LUNGS: Bilateral chest rise with no increased work  of breathing CV: LVAD, no audible murmur EXT: minimal edema, much improved, legs wrapped at tibial level ACCESS: rt IJ TC   Imaging: No results found.  Labs: BMET Recent Labs  Lab 10/17/21 0436 10/18/21 0440 10/19/21 0337 10/20/21 0458 10/20/21 1451 10/21/21 0405 10/22/21 0517 10/23/21 0658  NA 131* 136 136   137 134* 136 136 135 137  K 4.0 4.5 3.9   3.9 4.0 3.2* 3.7 3.5 4.2  CL 97* 99 99   100 97* 100 100 100 100  CO2 26 24 26   26 25 26 26 24 27   GLUCOSE 113* 87 78   78 103* 95 95 100* 77  BUN 19 31* 19   19 29* 12 20 30* 18  CREATININE 2.81* 3.93* 3.23*   3.22* 4.56* 2.70* 3.69* 4.77* 3.40*  CALCIUM 8.2* 8.6* 8.5*   8.6* 8.5* 8.8* 8.6* 8.5* 8.7*  PHOS 3.5 4.9* 4.6  --  3.4 4.7* 5.2* 3.7   CBC Recent Labs  Lab 10/20/21 0458 10/21/21 0405 10/22/21 0516 10/23/21 0657  WBC 8.8 7.6 8.3 6.5  HGB 8.6* 8.0* 8.4* 8.5*  HCT 27.2* 26.0* 27.0* 27.4*  MCV 93.2 94.2 94.4 94.5  PLT 523* 440* 412* 384  Medications:     amiodarone  200 mg Oral Daily   Chlorhexidine Gluconate Cloth  6 each Topical Q0600   darbepoetin (ARANESP) injection - NON-DIALYSIS  150 mcg Subcutaneous Q Mon-1800   docusate sodium  100 mg Oral BID   feeding supplement  237 mL Oral TID BM   mouth rinse  15 mL Mouth Rinse BID   midodrine  5 mg Oral Once per day on Mon Wed Fri   mirtazapine  7.5 mg Oral QHS   montelukast  5 mg Oral QHS   multivitamin  1 tablet Oral QHS   pantoprazole  40 mg Oral Daily   rosuvastatin  10 mg Oral Daily   senna  2 tablet Oral QHS   sildenafil  20 mg Oral TID   sodium chloride flush  10-40 mL Intracatheter Q12H   sodium chloride flush  3 mL Intravenous Q12H   Warfarin - Pharmacist Dosing Inpatient   Does not apply [facility]

## 2021-10-23 NOTE — Progress Notes (Addendum)
CARDIAC REHAB PHASE I   PRE:  Rate/Rhythm: 60 pacing    BP: sitting 102 map    SaO2:   MODE:  Ambulation: 400 ft   POST:  Rate/Rhythm: 76     BP: sitting to bathroom     SaO2:   Pt on batteries on my arrival, in bed. Got out of bed with reminders for sternal precautions. Walked with rollator, steady, no c/o. To BR after walk, able to clean himself. Wife present and urging him to eat. Discussed IS, sternal precautions, walking at home, and CRPII. Pt hesitant for CRPII however wife eager. Will place referral for [facility]. Gave reminders for carrying black bag. Pt would benefit from  rollator for home. [place], ACSM 10/23/2021 3:21 PM

## 2021-10-23 NOTE — Progress Notes (Signed)
Met with pt at bedside to discuss out-pt HD arrangements and to provide schedule letter. Navigator discussed arrangements with pt's wife yesterday afternoon via phone. Pt aware of schedule/clinic and agreeable to plan. Pt aware of start date for Monday. Will assist as needed.   Melven Sartorius Renal Navigator  [phone]

## 2021-10-23 NOTE — Progress Notes (Signed)
Br[facility] for Warfarin Indication: LVAD/AF  Allergies  Allergen Reactions   Mushroom Extract Complex Nausea And Vomiting   Neosporin [Neomycin-Bacitracin Zn-Polymyx] Hives    Patient Measurements: Height: 5\' 9"  (175.3 cm) Weight: 69.3 kg (152 lb 12.5 oz) IBW/kg (Calculated) : 70.7 Heparin Dosing Weight: 83.7 kg  Vital Signs: Temp: 98.7 F (37.1 C) (02/02 1102) Temp Source: Oral (02/02 1102) BP: 109/95 (02/02 1102) Pulse Rate: 60 (02/02 1008)  Labs: Recent Labs    10/21/21 0405 10/22/21 0516 10/22/21 0517 10/23/21 0657 10/23/21 0658  HGB 8.0* 8.4*  --  8.5*  --   HCT 26.0* 27.0*  --  27.4*  --   PLT 440* 412*  --  384  --   LABPROT 21.5* 21.6*  --  21.1*  --   INR 1.9* 1.9*  --  1.8*  --   CREATININE 3.69*  --  4.77*  --  3.40*     Estimated Creatinine Clearance: 20.1 mL/min (A) (by C-G formula based on SCr of 3.4 mg/dL (H)).   Medical History: Past Medical History:  Diagnosis Date   Arrhythmia    atrial fibrillation   CHF (congestive heart failure) (HCC)    Chronic kidney disease    Coronary artery disease    Hyperlipidemia    Hypertension    Myocardial infarct [facility])     Assessment: 71 yo male on chronic Eliquis PTA for afib.  Now s/p LVAD placement 09/17/21, pharmacy asked to begin anticoagulation with warfarin.   Patient previously therapeutic on lower doses 1-2mg  daily prior to tube feed being started for poor nutritional intake.   INR 1.8 slightly < goal -  was very sensitive in the past to small dose changes. CBC and LDH stable. No signs or symptoms of bleeding. Food intake improving  Will give warfarin boost today  Expect discharge warfarin dose to be closer to 2mg  daily at discharge.   Amiodarone dose reduced to 200mg  daily on 1/29.  Goal of Therapy:  INR 2-2.5 Monitor platelets by anticoagulation protocol: Yes   Plan:  -Warfarin 4 mg PO x1 today  -Daily INR, CBC, LDH -Expect discharge warfarin  dose to be closer to 2mg  daily at discharge.    Bonnita Nasuti Pharm.D. CPP, BCPS Clinical Pharmacist [phone] 10/23/2021 2:36 PM

## 2021-10-23 NOTE — Plan of Care (Signed)
°  Problem: Clinical Measurements: Goal: Ability to maintain clinical measurements within normal limits will improve Outcome: Progressing Goal: Will remain free from infection Outcome: Progressing Goal: Diagnostic test results will improve Outcome: Progressing Goal: Cardiovascular complication will be avoided Outcome: Progressing   Problem: Nutrition: Goal: Adequate nutrition will be maintained Outcome: Progressing   Problem: Skin Integrity: Goal: Risk for impaired skin integrity will decrease Outcome: Progressing

## 2021-10-23 NOTE — Discharge Summary (Addendum)
Advanced Heart Failure Team  Discharge Summary   Patient ID: Isaac [name] MRN: [id number], DOB/AGE: 12-12-1950 71 y.o. Admit date: 09/06/2021 D/C date:     10/24/2021   Primary Discharge Diagnoses:  1. A/C HFrEF 2. S/P HMIII Implant  3. Tricuspid regurgitation 3. Atrial fibrillation 4. AKI on CKD stage 3  5. CAD 6. ID 7. Constipation 8. Hyponatremia 9. Anemia 10. Confusion/delirium 12. Left pleural effusion 13. Pericardial effusion 14. Right great toe pain 15. Hyperkalemia 16. Deconditioning  Hospital Course:  Mr Givler is a 71 year old with history of 71 y.o. with history of CAD, HTN, atrial fibrillation, and chronic systolic CHF was admitted for recurrent CHF exacerbation and syncope.  Patient has had a long-standing cardiomyopathy, EF has been in the 25% range for years.  He had PCI to OM1 in 2007 and RCA in 2013.  From his report, these episodes do not sound like ACS events.  Echo this admission shows EF < 20%, severe LV dilation, restrictive diastolic function, moderate RV dysfunction, moderate MR, mod-severe TR. HF has been complicated by CKD stage 3. He is also noted on device interrogation to have been in atrial fibrillation persistently since 10/22.  Cardioversion has not been attempted.  He was put on amiodarone po transiently but was not taking at the time of admission.    He was admitted at [facility] in 08/22/21  BP was low, his BP-active meds were stopped and midodrine was begun.  He was diuresed and discharged home on 08/25/21    Admitted 09/06/21 with syncope. Device interrogation showed run of VT treated x 2 with ATP possibly around the time of syncope.  NSVT has been noted in the hospital.  CT surgery consulted and deemed appropriate for HMIII under destination therapy.   He will start iHD M-W-F dialysis at [facility]. He has tunneled catheter in place for d/c. VAD coordinators completed VAD education and all equipment delivered prior to d/c.   See below for detailed  problem list. He will continue to be followed closely in the VAD clinic.   1. Acute on chronic systolic CHF:  Long-standing cardiomyopathy.  [place].  Echo this admission with EF < 20%, severe LV dilation, restrictive diastolic function, moderate RV dysfunction, moderate MR, mod-severe TR. Cause of cardiomyopathy is uncertain.  He has a history of CAD, but I do not think that the described CAD from the past could explain his cardiomyopathy, but CAD could have progressed.  With difficulty tolerating GDMT/need for midodrine and cardiorenal syndrome as well as profound volume overload + NYHA class IV symptoms,  concerned for low output HF. Co-ox off milrinone was 36%, milrinone started and increased to 0.375 but CO remained low. NE added and Impella 5.5 placed 12/23. EF 10% on TEE 12/23. s/p HM III VAD on 12/28.  Ramp echo 01/03 with EF 20-25%, RV moderately reduced, septum mid-line.  Ramp echo again on 1/12 showed severely dilated/moderately dysfunctional RV with severe TR and left-shifted septum with small LV.  Speed decreased on 1/12 to 5000 rpm over 2 sessions due to RV failure and milrinone 0.25 + NE 3 restarted.  Echo repeated on 1/16, RV looks better and LV larger but still with left shift of the septum.  Able to increase CVVH, weight down significantly.  Echo again on 1/18, speed decreased to 4900 with RV mildly dysfunctional and severely enlarged with severe TR.  CVVHD stopped 1/20.  1/23 delay in iHD due to staffing, developed increased dyspnea + volume  overload CVP >20 and low flow alarms. VAD speed reduced to 4800. CVVH restarted. Milrinone restarted 0.25. Speed increased back to 5000 rpm and then to 5200.  Volume much improved. CVVH stopped 1/25 and now getting iHD.  Milrinone has been stopped, he is also off midodrine except for a dose prior to HD.  Stable LVAD parameters. - Continue midodrine 5 mg MWF pre-HD.  - Continue  sildenafil 20 tid for RV.   - Warfarin for INR 2-2.5.  INR 2.0  today, discussed dosing with PharmD personally.  - Continue 81 mg aspirin 2. Tricuspid regurgitation:  Tricuspid repair not done at time of VAD d/t proximity of ICD wires and hypotension during surgery.  He has severe TR.  3. Atrial fibrillation: A-paced. - Continue amiodarone to 200 mg daily . 4. AKI on CKD stage 3--->ESRD: Gradual rise (was about 1.7 pre-op) since surgery.  He has not been hypotensive post-op, suspect intra-op hypotension led to development of ATN => urine sediment looked like ATN per renal.  - Nephrology consulted. Got CVVH. TDC placed 1/7. Now off CVVH and now with iHD, tolerating so far. Had tunneled HD catheter placed.  - Tolerating iHD. Set up for M-W-F at [facility]. - Will give midodrine on the mornings of HD.   5. CAD: History of PCI to OM1 in 2007 and RCA in 2013.  No CP or ACs.  - Deferred cardiac cath due to AKI and plan for VAD - Continue Crestor.  6. ID: Had fever 12/21, PCT 1.03.  Blood cultures negative. ?Phlebitis at IV site. Post-op fever with elevated WBCs, PCT 2.63 -> 1.78.  He completed course of cefepime.  - Resolved.  7. Constipation: Resolved.  8. Hyponatremia: Hypervolemic hyponatremia.  Controlled via dialysis.  9. Anemia: 1 unit PRBCs on 1/12,  1 unit PRBs 1/13, 1 unit 1/16. No overt bleeding, ?losses via CVVH.  Hgb 8.5 today, no evidence bleeding - Transfuse for Hgb <7.5  - Received IV iron x 5 doses per nephrology (finished 1/20) 10. Confusion/delirium: Initially post-op and started on Precedex.  Resolved.  11. F/E/N: Improving po intake.  - Added remeron to help with appetite.  12. Left pleural effusion: - s/p thoracentesis 1/7 - s/p thoracentesis 1/12. 13. Pericardial effusion: Stable moderate pericardial effusion, primarily localized to along the lateral wall of the LV.  Does not appear to be affecting the RV/doubt tamponade.  Discussed after 1/12 and 1/18 echo with Dr. Cyndia Bent, conservative management for now.  14. Right great toe pain: - ?  Possible gout.  15. Hyperkalemia - Resolved w/ HD and lokelma.  16. Deconditioning: Home Health PT recommended and arranged  LVAD INTERROGATION:  HeartMate III LVAD:   Flow 3.8 liters/min, speed 5200, power 3.4 PI 3.4  Multiple PI events.  VAD interrogated personally. Parameters stable.  Discharge Vitals: Blood pressure (!) 71/52, pulse 60, temperature (!) 97.1 F (36.2 C), temperature source Temporal, resp. rate 20, height 5\' 9"  (1.753 m), weight 70 kg, SpO2 96 %.  Labs: Lab Results  Component Value Date   WBC 6.5 10/23/2021   HGB 8.5 (L) 10/23/2021   HCT 27.4 (L) 10/23/2021   MCV 94.5 10/23/2021   PLT 384 10/23/2021    Recent Labs  Lab 10/24/21 0500  NA 136  K 4.3  CL 100  CO2 26  BUN 29*  CREATININE 4.29*  CALCIUM 9.0  GLUCOSE 85   Lab Results  Component Value Date   CHOL 94 09/08/2021   HDL 25 (L) 09/08/2021  LDLCALC 58 09/08/2021   TRIG 54 09/08/2021   BNP (last 3 results) Recent Labs    10/13/21 2342 10/15/21 0356 10/22/21 0517  BNP 1,113.9* 1,173.8* 1,171.3*    ProBNP (last 3 results) No results for input(s): PROBNP in the last 8760 hours.   Diagnostic Studies/Procedures   No results found.  Discharge Medications   Allergies as of 10/24/2021       Reactions   Mushroom Extract Complex Nausea And Vomiting   Neosporin [neomycin-bacitracin Zn-polymyx] Hives        Medication List     STOP taking these medications    apixaban 5 MG Tabs tablet Commonly known as: ELIQUIS   furosemide 20 MG tablet Commonly known as: LASIX   metoprolol tartrate 25 MG tablet Commonly known as: LOPRESSOR   spironolactone 25 MG tablet Commonly known as: ALDACTONE   tamsulosin 0.4 MG Caps capsule Commonly known as: FLOMAX       TAKE these medications    albuterol 108 (90 Base) MCG/ACT inhaler Commonly known as: VENTOLIN HFA Inhale 2 puffs into the lungs every 6 (six) hours as needed.   amiodarone 200 MG tablet Commonly known as: PACERONE Take  1 tablet (200 mg total) by mouth daily. What changed: when to take this   midodrine 5 MG tablet Commonly known as: PROAMATINE Take M-W-F 30 min before HD session What changed:  medication strength how much to take how to take this when to take this additional instructions   mirtazapine 7.5 MG tablet Commonly known as: REMERON Take 1 tablet (7.5 mg total) by mouth at bedtime.   montelukast 10 MG tablet Commonly known as: SINGULAIR Take by mouth.   multivitamin Tabs tablet Take 1 tablet by mouth at bedtime.   pantoprazole 40 MG tablet Commonly known as: PROTONIX Take 1 tablet (40 mg total) by mouth daily.   rosuvastatin 10 MG tablet Commonly known as: CRESTOR Take 1 tablet (10 mg total) by mouth daily.   sildenafil 20 MG tablet Commonly known as: REVATIO Take 1 tablet (20 mg total) by mouth 3 (three) times daily.   warfarin 2 MG tablet Commonly known as: Coumadin Take 1 tablet (2 mg total) by mouth daily at 4 PM.               Durable Medical Equipment  (From admission, onward)           Start     Ordered   10/21/21 1204  Heart failure home health orders  (Heart failure home health orders / Face to face)  Once       Comments: Heart Failure Follow-up Care:  Verify follow-up appointments per Patient Discharge Instructions. Confirm transportation arranged. Reconcile home medications with discharge medication list. Remove discontinued medications from use. Assist patient/caregiver to manage medications using pill box. Reinforce low sodium food selection Labs per VAD clinic Assessments: Vital signs and oxygen saturation at each visit. Assess home environment for safety concerns, caregiver support and availability of low-sodium foods. Consult Education officer, museum, PT/OT, Dietitian, and CNA based on assessments. Perform comprehensive cardiopulmonary assessment. Notify MD for any change in condition or weight gain of 3 pounds in one day or 5 pounds in one week with  symptoms. Daily Weights and Symptom Monitoring: Ensure patient has access to scales. Teach patient/caregiver to weigh daily before breakfast and after voiding using same scale and record.    Teach patient/caregiver to track weight and symptoms and when to notify Provider. Activity: Develop individualized activity plan with patient/caregiver.  Question Answer Comment  Heart Failure Follow-up Care Advanced Heart Failure (AHF) Clinic at [phone]   Obtain the following labs Basic Metabolic Panel   Lab frequency Other see comments   Fax lab results to AHF Clinic at [phone]   Diet Low Sodium Heart Healthy   Fluid restrictions: 1800 mL Fluid      10/21/21 1205   10/21/21 1159  For home use only DME 3 n 1  Once        10/21/21 1158   10/21/21 1159  For home use only DME 4 wheeled rolling walker with seat  Once       Question Answer Comment  Patient needs a walker to treat with the following condition LVAD (left ventricular assist device) present [facility])   Patient needs a walker to treat with the following condition Heart failure ([place])      10/21/21 1158            Disposition   The patient will be discharged in stable condition to home. Discharge Instructions     Amb Referral to Cardiac Rehabilitation   Complete by: As directed    Diagnosis: Heart Failure (see criteria below if ordering Phase II)   Heart Failure Type: Chronic Systolic & Diastolic Comment - VAD   After initial evaluation and assessments completed: Virtual Based Care may be provided alone or in conjunction with Phase 2 Cardiac Rehab based on patient barriers.: Yes   Diet - low sodium heart healthy   Complete by: As directed    INR  Goal: 2 - 2.5   Complete by: As directed    Goal: 2 - 2.5   Increase activity slowly   Complete by: As directed    Page VAD Coordinator at [phone]  Notify for: any VAD alarms, sustained elevations of power >10 watts, sustained drop in Pulse Index <3   Complete by: As  directed    Notify for:  any VAD alarms sustained elevations of power >10 watts sustained drop in Pulse Index <3     Speed Settings:   Complete by: As directed    Fixed 5200 RPM Low 4900 RPM       Follow-up Information     [facility] Follow up.   Why: Home Health RN, Physical Therapy and Occupational Therapy-agency will call to arrange appt Contact information: [phone]        [facility] Follow up.   Why: Schedule is Monday, Wednesday, Friday with 11:20 chair time.  Patient can start on Monday.  Patient will need to arrive at 10:35 for first appointment to complete paperwork. Contact information: [place] [address] [place] Follow up.   Specialty: Cardiology Why: 10/30/21 @ 11:00 AM LVAD Clinic, [place] Code 7654 Contact information: [address] [id number] [place] [phone]                  Duration of Discharge Encounter: Greater than 35 minutes   Signed, Nelida Gores  10/24/2021, 9:18 AM  Patient seen with PA, agree with the above note.   Discharge today, he has been set up for HD in [place] on Monday.   Loralie Champagne 10/24/2021 4:17 PM

## 2021-10-23 NOTE — Progress Notes (Addendum)
LVAD Coordinator Rounding Note:  Admitted 09/06/21 due to CHF exacerbation to Dr Claris Gladden service. He was started on Milrinone on admission. Febrile 09/10/21- suspected to have UTI- antibiotics started. Impella 5.5 placed 09/12/21.   HM III LVAD implanted on 09/17/21 by Dr Cyndia Bent under destination therapy criteria. 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   Pt sitting up in bed today. Reports he did "ok" with dialysis yesterday and had no VAD alarms. Does admit to feeling "tired" after treatments.   Confirmed with patient [place] has accepted his care and his first outpatient dialysis treatment is scheduled Monday, 09/26/21. He is to arrive at 10:30 with HD scheduled at 11:20. A VAD Coordinator will meet him and his wife at the center for staff education. His wife is aware and agrees with plan. She will not be coming today since she is preparing for his possible discharge home Friday, 10/24/21 after IP dialysis is completed. Pt verbalized understanding and agreement to above plan.    Vital signs: Temp: 98.7 HR: 60 Doppler Pressure: 92 Auto Cuff BP:  100/85 (92) O2 Sat: 97% on RA Wt: 185.2>196.8.Marland Kitchen..154.3>155.4>163.8>155.4>150.7>154.1>152>159.4>160.2>152>152.7 lbs    LVAD interrogation reveals:  Speed: 5200 Flow: 4.0 Power: 3.5w PI:  5.0 Hematocrit: 20-- DO NOT ADJUST  Alarms: none Events: 48 PI events yesterday Fixed speed: 5200 Low speed limit: 4900  Drive Line:  Existing VAD dressing C/D/I with anchor intact and accurately applied. Existing VAD dressing removed and site care performed using sterile technique. Drive line exit site cleaned with Chlora prep applicators x 2, allowed to dry, rinsed with saline and Sorbaview dressing with Silverlon patch applied. Exit site not incorporated, the velour is fully implanted at exit site.  Scant amount of dried tissue fat necrosis drainage  with no redness, tenderness, foul odor or rash noted. Drive line anchor replaced. Advance to weekly dressing changes per Nurse Davonna Belling, [place] coordinator, or trained caregiver. Next dressing change due: 10/30/21.   Labs:  LDH trend: 344>379.............226>257>238>229>222>192>177>173>178  INR trend: 1.6>1.8>2.8>2.9>2.1.................2.6>2.2>1.7>1.8>1.7>1.9>1.9>1.8  Anticoagulation Plan: -INR Goal: 2.0 - 2.5 -ASA Dose: 81 mg daily   Blood Products:  Intra Op: 09/17/21  1 PLT  4 FFP   4 PRBC  Cellsaver- 803 cc Post Op:  - 09/17/21>>1 PRBC - 09/18/21>> 3 PRBCs - 09/19/21>> 1 PRBC -  09/26/21>> 1 PRBC - 10/02/21>>1 PRBC - 10/03/21>>1 PRBC - 10/06/21 >> 1 PRBC  Device: - [employer] dual - AV paced 60 -Therapies: on  Arrythmias: 3 episodes of VT that required defib while in OR 09/17/21. Currently on Amiodarone gtt. Remains in afib.   Respiratory: extubated 09/18/22  Nitric Oxide:  off 09/18/22  Renal:  09/23/21>>started CVVH 10/10/21>>stopped CVVH 10/13/21>> restarted CVVH 10/15/21>> stopped CVVH  10/16/21>>started HD (hemodialysis)  Infection:  09/10/21>> blood cultures >> No growth 5 days; final 09/12/21>> urine culture >> No growth; final  09/25/21>>blood cultures>>negative  09/27/21>>pleural effusion>>negative, final 10/02/21>> pleural effusion >> no growth; final     Patient Education: Discharge education completed with patient and his wife 10/21/21.Marland Kitchen  Discussed possible discharge date of Friday, 10/24/21 with patient and wife. Pt feels comfortable about how to reach VAD Coordinator 24/7 using pager. Explained his home equipment has not arrived as of today, if it doesn't arrive prior to his discharge, we will send him home with loaner equipment from hospital and replace with new when it arrives. Pt verbalized understanding and agreement to same.  Referral  made for device clinic appointment for ICD management Tuesday, 12/02/21 at 2:15 pm with Dr. Caryl Comes - wife  updated yesterday with verbalized understanding and agreement to same.   Plan/Recommendations:  1. Page VAD coordinator for drive line or equipment concerns 2. Advance to weekly dressing change per Nurse Davonna Belling or VAD coordinator on Monday and Thursday. 3.  Home VAD equipment has been ordered.  4.  Possible discharge Friday, 10/24/21 after dialysis.   Zada Girt RN [place] Coordinator  Office: [phone]  24/7 Pager: [phone]

## 2021-10-24 ENCOUNTER — Other Ambulatory Visit (HOSPITAL_COMMUNITY): Payer: Self-pay

## 2021-10-24 LAB — RENAL FUNCTION PANEL
Albumin: 2.6 g/dL — ABNORMAL LOW (ref 3.5–5.0)
Anion gap: 10 (ref 5–15)
BUN: 29 mg/dL — ABNORMAL HIGH (ref 8–23)
CO2: 26 mmol/L (ref 22–32)
Calcium: 9 mg/dL (ref 8.9–10.3)
Chloride: 100 mmol/L (ref 98–111)
Creatinine, Ser: 4.29 mg/dL — ABNORMAL HIGH (ref 0.61–1.24)
GFR, Estimated: 14 mL/min — ABNORMAL LOW (ref >60)
Glucose, Bld: 85 mg/dL (ref 70–99)
Phosphorus: 4.2 mg/dL (ref 2.5–4.6)
Potassium: 4.3 mmol/L (ref 3.5–5.1)
Sodium: 136 mmol/L (ref 135–145)

## 2021-10-24 LAB — GLUCOSE, CAPILLARY
Glucose-Capillary: 74 mg/dL (ref 70–99)
Glucose-Capillary: 81 mg/dL (ref 70–99)
Glucose-Capillary: 84 mg/dL (ref 70–99)

## 2021-10-24 LAB — PROTIME-INR
INR: 2 — ABNORMAL HIGH (ref 0.8–1.2)
Prothrombin Time: 22.8 seconds — ABNORMAL HIGH (ref 11.4–15.2)

## 2021-10-24 LAB — MAGNESIUM: Magnesium: 1.8 mg/dL (ref 1.7–2.4)

## 2021-10-24 LAB — LACTATE DEHYDROGENASE: LDH: 184 U/L (ref 98–192)

## 2021-10-24 MED ORDER — SILDENAFIL CITRATE 20 MG PO TABS
20.0000 mg | ORAL_TABLET | Freq: Three times a day (TID) | ORAL | 6 refills | Status: DC
  Filled 2021-10-24: qty 90, 30d supply, fill #0

## 2021-10-24 MED ORDER — PANTOPRAZOLE SODIUM 40 MG PO TBEC
40.0000 mg | DELAYED_RELEASE_TABLET | Freq: Every day | ORAL | 6 refills | Status: DC
  Filled 2021-10-24: qty 30, 30d supply, fill #0

## 2021-10-24 MED ORDER — CERTAVITE/ANTIOXIDANTS PO TABS
1.0000 | ORAL_TABLET | Freq: Every day | ORAL | 0 refills | Status: DC
  Filled 2021-10-24: qty 30, 30d supply, fill #0

## 2021-10-24 MED ORDER — ROSUVASTATIN CALCIUM 10 MG PO TABS
10.0000 mg | ORAL_TABLET | Freq: Every day | ORAL | 6 refills | Status: DC
  Filled 2021-10-24: qty 30, 30d supply, fill #0

## 2021-10-24 MED ORDER — AMIODARONE HCL 200 MG PO TABS
200.0000 mg | ORAL_TABLET | Freq: Every day | ORAL | 6 refills | Status: DC
Start: 2021-10-24 — End: 2021-11-13
  Filled 2021-10-24: qty 30, 30d supply, fill #0

## 2021-10-24 MED ORDER — MIDODRINE HCL 10 MG PO TABS
ORAL_TABLET | ORAL | 6 refills | Status: DC
  Filled 2021-10-24: qty 15, 30d supply, fill #0

## 2021-10-24 MED ORDER — WARFARIN SODIUM 2 MG PO TABS
2.0000 mg | ORAL_TABLET | Freq: Every day | ORAL | 11 refills | Status: DC
  Filled 2021-10-24: qty 30, 30d supply, fill #0

## 2021-10-24 MED ORDER — MIRTAZAPINE 7.5 MG PO TABS
7.5000 mg | ORAL_TABLET | Freq: Every day | ORAL | 6 refills | Status: DC
  Filled 2021-10-24: qty 30, 30d supply, fill #0

## 2021-10-24 MED ORDER — MIDODRINE HCL 5 MG PO TABS
ORAL_TABLET | ORAL | 6 refills | Status: DC
  Filled 2021-10-24: qty 15, 30d supply, fill #0

## 2021-10-24 NOTE — Plan of Care (Signed)
°  Problem: Health Behavior/Discharge Planning: Goal: Ability to manage health-related needs will improve Outcome: Progressing   Problem: Clinical Measurements: Goal: Ability to maintain clinical measurements within normal limits will improve Outcome: Progressing Goal: Will remain free from infection Outcome: Progressing Goal: Diagnostic test results will improve Outcome: Progressing Goal: Respiratory complications will improve Outcome: Progressing Goal: Cardiovascular complication will be avoided Outcome: Progressing   Problem: Activity: Goal: Risk for activity intolerance will decrease Outcome: Progressing   Problem: Nutrition: Goal: Adequate nutrition will be maintained Outcome: Progressing   Problem: Coping: Goal: Level of anxiety will decrease Outcome: Progressing   Problem: Elimination: Goal: Will not experience complications related to bowel motility Outcome: Progressing Goal: Will not experience complications related to urinary retention Outcome: Progressing   Problem: Pain Managment: Goal: General experience of comfort will improve Outcome: Progressing   Problem: Safety: Goal: Ability to remain free from injury will improve Outcome: Progressing   Problem: Skin Integrity: Goal: Risk for impaired skin integrity will decrease Outcome: Progressing   Problem: Education: Goal: Ability to demonstrate management of disease process will improve Outcome: Progressing Goal: Ability to verbalize understanding of medication therapies will improve Outcome: Progressing Goal: Individualized Educational Video(s) Outcome: Progressing   Problem: Activity: Goal: Capacity to carry out activities will improve Outcome: Progressing   Problem: Cardiac: Goal: Ability to achieve and maintain adequate cardiopulmonary perfusion will improve Outcome: Progressing   Problem: Education: Goal: Knowledge of the prescribed therapeutic regimen will improve Outcome: Progressing    Problem: Activity: Goal: Risk for activity intolerance will decrease Outcome: Progressing   Problem: Cardiac: Goal: Ability to maintain an adequate cardiac output will improve Outcome: Progressing   Problem: Coping: Goal: Level of anxiety will decrease Outcome: Progressing   Problem: Fluid Volume: Goal: Risk for excess fluid volume will decrease Outcome: Progressing   Problem: Clinical Measurements: Goal: Ability to maintain clinical measurements within normal limits will improve Outcome: Progressing Goal: Will remain free from infection Outcome: Progressing   Problem: Respiratory: Goal: Will regain and/or maintain adequate ventilation Outcome: Progressing   Problem: Education: Goal: Knowledge of cardiac device and self-care will improve Outcome: Progressing Goal: Ability to safely manage health related needs after discharge will improve Outcome: Progressing Goal: Individualized Educational Video(s) Outcome: Progressing   Problem: Cardiac: Goal: Ability to achieve and maintain adequate cardiopulmonary perfusion will improve Outcome: Progressing

## 2021-10-24 NOTE — TOC CM/SW Note (Addendum)
HF TOC CM notified [place] rep, Freda Munro of scheduled dc home today. Will need Rolling Walker with seat and bedside commode delivered to room. Christian to make aware of dc home today. [place], Heart Failure TOC CM [phone]

## 2021-10-24 NOTE — Plan of Care (Signed)
Problem: Health Behavior/Discharge Planning: Goal: Ability to manage health-related needs will improve Outcome: Adequate for Discharge   Problem: Clinical Measurements: Goal: Ability to maintain clinical measurements within normal limits will improve Outcome: Adequate for Discharge Goal: Will remain free from infection Outcome: Adequate for Discharge Goal: Diagnostic test results will improve Outcome: Adequate for Discharge Goal: Respiratory complications will improve Outcome: Adequate for Discharge Goal: Cardiovascular complication will be avoided Outcome: Adequate for Discharge   Problem: Activity: Goal: Risk for activity intolerance will decrease Outcome: Adequate for Discharge   Problem: Nutrition: Goal: Adequate nutrition will be maintained Outcome: Adequate for Discharge   Problem: Coping: Goal: Level of anxiety will decrease Outcome: Adequate for Discharge   Problem: Elimination: Goal: Will not experience complications related to bowel motility Outcome: Adequate for Discharge Goal: Will not experience complications related to urinary retention Outcome: Adequate for Discharge   Problem: Pain Managment: Goal: General experience of comfort will improve Outcome: Adequate for Discharge   Problem: Safety: Goal: Ability to remain free from injury will improve Outcome: Adequate for Discharge   Problem: Skin Integrity: Goal: Risk for impaired skin integrity will decrease Outcome: Adequate for Discharge   Problem: Acute Rehab OT Goals (only OT should resolve) Goal: Pt. Will Perform Lower Body Bathing Outcome: Adequate for Discharge Goal: Pt. Will Perform Lower Body Dressing Outcome: Adequate for Discharge Goal: Pt. Will Transfer To Toilet Outcome: Adequate for Discharge Goal: OT Additional ADL Goal #1 Outcome: Adequate for Discharge   Problem: Acute Rehab OT Goals (only OT should resolve) Goal: OT Additional ADL Goal #2 Outcome: Adequate for Discharge    Problem: Acute Rehab PT Goals(only PT should resolve) Goal: Patient Will Transfer Sit To/From Stand Outcome: Adequate for Discharge Goal: Pt Will Ambulate Outcome: Adequate for Discharge Goal: Pt Will Go Up/Down Stairs Outcome: Adequate for Discharge   Problem: Education: Goal: Ability to demonstrate management of disease process will improve Outcome: Adequate for Discharge Goal: Ability to verbalize understanding of medication therapies will improve Outcome: Adequate for Discharge Goal: Individualized Educational Video(s) Outcome: Adequate for Discharge   Problem: Activity: Goal: Capacity to carry out activities will improve Outcome: Adequate for Discharge   Problem: Cardiac: Goal: Ability to achieve and maintain adequate cardiopulmonary perfusion will improve Outcome: Adequate for Discharge   Problem: Acute Rehab PT Goals(only PT should resolve) Goal: PT Additional Goal #1 Outcome: Adequate for Discharge   Problem: Acute Rehab OT Goals (only OT should resolve) Goal: OT Additional ADL Goal #3 Outcome: Adequate for Discharge   Problem: Education: Goal: Knowledge of the prescribed therapeutic regimen will improve Outcome: Adequate for Discharge   Problem: Activity: Goal: Risk for activity intolerance will decrease Outcome: Adequate for Discharge   Problem: Cardiac: Goal: Ability to maintain an adequate cardiac output will improve Outcome: Adequate for Discharge   Problem: Coping: Goal: Level of anxiety will decrease Outcome: Adequate for Discharge   Problem: Fluid Volume: Goal: Risk for excess fluid volume will decrease Outcome: Adequate for Discharge   Problem: Clinical Measurements: Goal: Ability to maintain clinical measurements within normal limits will improve Outcome: Adequate for Discharge Goal: Will remain free from infection Outcome: Adequate for Discharge   Problem: Respiratory: Goal: Will regain and/or maintain adequate ventilation Outcome:  Adequate for Discharge   Problem: Education: Goal: Knowledge of cardiac device and self-care will improve Outcome: Adequate for Discharge Goal: Ability to safely manage health related needs after discharge will improve Outcome: Adequate for Discharge Goal: Individualized Educational Video(s) Outcome: Adequate for Discharge   Problem: Cardiac: Goal:  Ability to achieve and maintain adequate cardiopulmonary perfusion will improve Outcome: Adequate for Discharge

## 2021-10-24 NOTE — Progress Notes (Signed)
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 Acute component either from cardiorenal +/- ATN with decreased renal function from hemodynamics and CPB + hypotensive episodes + pressors.  - CRRT 1/3-1/20, 1/23-1/25 then iHD starting 1/26. has tunneled HD cath in place.   - Plan to continue HD MWF schedule for now -The patient's baseline creatinine was around 2.  Given he has not had significant renal recovery and he has been dependent on dialysis for 30 days we will call him ESRD at this point   Cardiogenic shock: s/p LVAD 09/17/22 (HM 3).   - on  midodrine now, off NE and off milrinone -   UF as able with HD  -Also on sildenafil  CAD with h/o PCI last in 2013  Anemia of CKD: Hgb stable around 8-9. S/p IV iron, on ESA.  Afib- on amio  Acute hypoxic RF: s/p thoracentesis 1.3 L 10/02/21  Hyperkalemia: resolved with RRT  Bones-  phos Ok -  watch for binder need-   PTH 59  Dispo: tolerating HD better, has been accepted for MWF dialysis at [facility] to discharge after dialysis today  Subjective:    Patient seen on dialysis today tolerating fairly well.  MAP 68 when I saw him.  No complaints.  Planning to discharge today    Objective:   BP (!) 76/64    Pulse 60    Temp (!) 97.1 F (36.2 C) (Temporal)    Resp 19    Ht 5\' 9"  (1.753 m)    Wt 70 kg    SpO2 96%    BMI 22.79 kg/m   Intake/Output Summary (Last 24 hours) at 10/24/2021 1015 Last data filed at 10/23/2021 1227 Gross per 24 hour  Intake 0 ml  Output --  Net 0 ml   Weight change: 0.1 kg  Physical Exam: GEN: NAD, alert, pleasant  HEENT: wearing glasses, no nasal discharge, moist mucous membranes LUNGS: Bilateral chest rise with no increased work of breathing CV: LVAD, no audible murmur EXT: minimal edema, much improved, legs wrapped at tibial level ACCESS: rt IJ TC   Imaging: No results  found.  Labs: BMET Recent Labs  Lab 10/18/21 0440 10/19/21 0337 10/20/21 0458 10/20/21 1451 10/21/21 0405 10/22/21 0517 10/23/21 0658 10/24/21 0500  NA 136 136   137 134* 136 136 135 137 136  K 4.5 3.9   3.9 4.0 3.2* 3.7 3.5 4.2 4.3  CL 99 99   100 97* 100 100 100 100 100  CO2 24 26   26 25 26 26 24 27 26   GLUCOSE 87 78   78 103* 95 95 100* 77 85  BUN 31* 19   19 29* 12 20 30* 18 29*  CREATININE 3.93* 3.23*   3.22* 4.56* 2.70* 3.69* 4.77* 3.40* 4.29*  CALCIUM 8.6* 8.5*   8.6* 8.5* 8.8* 8.6* 8.5* 8.7* 9.0  PHOS 4.9* 4.6  --  3.4 4.7* 5.2* 3.7 4.2   CBC Recent Labs  Lab 10/20/21 0458 10/21/21 0405 10/22/21 0516 10/23/21 0657  WBC 8.8 7.6 8.3 6.5  HGB 8.6* 8.0* 8.4* 8.5*  HCT 27.2* 26.0* 27.0* 27.4*  MCV 93.2 94.2 94.4 94.5  PLT 523* 440* 412* 384    Medications:     amiodarone  200 mg Oral Daily   Chlorhexidine Gluconate Cloth  6 each Topical Q0600   darbepoetin (ARANESP) injection -  NON-DIALYSIS  150 mcg Subcutaneous Q Mon-1800   docusate sodium  100 mg Oral BID   feeding supplement  237 mL Oral TID BM   mouth rinse  15 mL Mouth Rinse BID   midodrine  5 mg Oral Once per day on Mon Wed Fri   mirtazapine  7.5 mg Oral QHS   montelukast  5 mg Oral QHS   multivitamin  1 tablet Oral QHS   pantoprazole  40 mg Oral Daily   rosuvastatin  10 mg Oral Daily   senna  2 tablet Oral QHS   sildenafil  20 mg Oral TID   sodium chloride flush  10-40 mL Intracatheter Q12H   sodium chloride flush  3 mL Intravenous Q12H   Warfarin - Pharmacist Dosing Inpatient   Does not apply [facility]

## 2021-10-24 NOTE — Procedures (Signed)
I was present at this dialysis session. I have reviewed the session itself and made appropriate changes.   Filed Weights   10/23/21 0619 10/24/21 0526 10/24/21 0744  Weight: 69.3 kg 69.4 kg 70 kg    Recent Labs  Lab 10/24/21 0500  NA 136  K 4.3  CL 100  CO2 26  GLUCOSE 85  BUN 29*  CREATININE 4.29*  CALCIUM 9.0  PHOS 4.2    Recent Labs  Lab 10/21/21 0405 10/22/21 0516 10/23/21 0657  WBC 7.6 8.3 6.5  HGB 8.0* 8.4* 8.5*  HCT 26.0* 27.0* 27.4*  MCV 94.2 94.4 94.5  PLT 440* 412* 384    Scheduled Meds:  amiodarone  200 mg Oral Daily   Chlorhexidine Gluconate Cloth  6 each Topical Q0600   darbepoetin (ARANESP) injection - NON-DIALYSIS  150 mcg Subcutaneous Q Mon-1800   docusate sodium  100 mg Oral BID   feeding supplement  237 mL Oral TID BM   mouth rinse  15 mL Mouth Rinse BID   midodrine  5 mg Oral Once per day on Mon Wed Fri   mirtazapine  7.5 mg Oral QHS   montelukast  5 mg Oral QHS   multivitamin  1 tablet Oral QHS   pantoprazole  40 mg Oral Daily   rosuvastatin  10 mg Oral Daily   senna  2 tablet Oral QHS   sildenafil  20 mg Oral TID   sodium chloride flush  10-40 mL Intracatheter Q12H   sodium chloride flush  3 mL Intravenous Q12H   Warfarin - Pharmacist Dosing Inpatient   Does not apply q1600   Continuous Infusions:  sodium chloride Stopped (09/18/21 0803)   sodium chloride Stopped (10/02/21 0900)   sodium chloride     sodium chloride     promethazine (PHENERGAN) injection (IM or IVPB) Stopped (10/10/21 1755)   PRN Meds:.sodium chloride, sodium chloride, sodium chloride, acetaminophen, albuterol, alteplase, Gerhardt's butt cream, heparin, heparin, lidocaine (PF), lidocaine-prilocaine, lip balm, ondansetron (ZOFRAN) IV, oxyCODONE, pentafluoroprop-tetrafluoroeth, polyethylene glycol, promethazine (PHENERGAN) injection (IM or IVPB), sodium chloride flush, sodium chloride flush, traMADol   Santiago Bumpers,  MD 10/24/2021, 10:17 AM

## 2021-10-24 NOTE — Progress Notes (Signed)
[place] for Warfarin Indication: LVAD/AF  Allergies  Allergen Reactions   Mushroom Extract Complex Nausea And Vomiting   Neosporin [Neomycin-Bacitracin Zn-Polymyx] Hives    Patient Measurements: Height: 5\' 9"  (175.3 cm) Weight: 70 kg (154 lb 5.2 oz) IBW/kg (Calculated) : 70.7 Heparin Dosing Weight: 83.7 kg  Vital Signs: Temp: 97.1 F (36.2 C) (02/03 0744) Temp Source: Temporal (02/03 0744) BP: 87/73 (02/03 0930) Pulse Rate: 91 (02/03 0930)  Labs: Recent Labs    10/22/21 0516 10/22/21 0517 10/23/21 0657 10/23/21 0658 10/24/21 0500  HGB 8.4*  --  8.5*  --   --   HCT 27.0*  --  27.4*  --   --   PLT 412*  --  384  --   --   LABPROT 21.6*  --  21.1*  --  22.8*  INR 1.9*  --  1.8*  --  2.0*  CREATININE  --  4.77*  --  3.40* 4.29*     Estimated Creatinine Clearance: 16.1 mL/min (A) (by C-G formula based on SCr of 4.29 mg/dL (H)).   Medical History: Past Medical History:  Diagnosis Date   Arrhythmia    atrial fibrillation   CHF (congestive heart failure) (HCC)    Chronic kidney disease    Coronary artery disease    Hyperlipidemia    Hypertension    Myocardial infarct [facility])     Assessment: 71 yo male on chronic Eliquis PTA for afib.  Now s/p LVAD placement 09/17/21, pharmacy asked to begin anticoagulation with warfarin.   Patient previously therapeutic on lower doses 1-2mg  daily prior to tube feed being started for poor nutritional intake.   INR 2 today -  was very sensitive in the past to small dose changes. CBC and LDH stable. No signs or symptoms of bleeding. Food intake improving   Amiodarone dose reduced to 200mg  daily on 1/29.  Goal of Therapy:  INR 2-2.5 Monitor platelets by anticoagulation protocol: Yes   Plan:  Warfarin 2mg  daily prescribed at d/c Will send message to HF pharmacist  Erin Hearing PharmD., BCPS Clinical Pharmacist 10/24/2021 9:51 AM

## 2021-10-24 NOTE — Progress Notes (Signed)
LVAD Coordinator Rounding Note:  Admitted 09/06/21 due to CHF exacerbation to Dr Claris Gladden service. He was started on Milrinone on admission. Febrile 09/10/21- suspected to have UTI- antibiotics started. Impella 5.5 placed 09/12/21.   HM III LVAD implanted on 09/17/21 by Dr Cyndia Bent under destination therapy criteria. 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   Patient completed HD this am. Transported patient back to 2C15. He denies complaints and is "ready" to go home. Spoke with Mrs. Justin Mend on phone and answered her questions. They are both looking forward to him coming home. Both agreed to call VAD pager if any questions or concerns.   Home equipment has not been received due to weather issues per [employer]. Loaner equipment delivered to patients' room including:  One Mobile Power Unit One Electrical engineer  Eight fully charged batteries  Four battery clips  4 weekly dressing kits 7 daily dressing kits 5 anchors   Patient owned equipment already in use:  One consolidated bag  One travel case  One Merchant navy officer Two system controllers    Written instructions re: outpatient dialysis treatment is scheduled Monday, 09/26/21. Detailed instructions included in discharge summary.  Vital signs: Temp: 97.1 HR: 60 A paced Doppler Pressure: not documented Auto Cuff BP:  97/66 (74) O2 Sat: 96% on RA Wt: 185.2>196.8.Marland Kitchen..154.3>155.4>163.8>155.4>150.7>154.1>152>159.4>160.2>152>152.7>154.3 lbs    LVAD interrogation reveals:  Speed: 5200 Flow: 3.1 Power: 4.0w PI:  8.2 Hematocrit: 20-- DO NOT ADJUST  Alarms: on batteries Events: on batteries Fixed speed: 5200 Low speed limit: 4900  Drive Line:  Existing VAD dressing C/D/I with anchor intact and accurately applied. Weekly dressing changes per Nurse Davonna Belling, [place] coordinator, or trained caregiver. Next dressing change due: 10/30/21.   Labs:  LDH trend:  344>379.............226>257>238>229>222>192>177>173>178>184  INR trend: 1.6>1.8>2.8>2.9>2.1.................2.6>2.2>1.7>1.8>1.7>1.9>1.9>1.8>2.0  Anticoagulation Plan: -INR Goal: 2.0 - 2.5 -ASA Dose: 81 mg daily   Blood Products:  Intra Op: 09/17/21  1 PLT  4 FFP   4 PRBC  Cellsaver- 803 cc Post Op:  - 09/17/21>>1 PRBC - 09/18/21>> 3 PRBCs - 09/19/21>> 1 PRBC -  09/26/21>> 1 PRBC - 10/02/21>>1 PRBC - 10/03/21>>1 PRBC - 10/06/21 >> 1 PRBC  Device: - [employer] dual - AV paced 60 -Therapies: on  Arrythmias: 3 episodes of VT that required defib while in OR 09/17/21.Marland Kitchen   Respiratory: extubated 09/18/22  Nitric Oxide:  off 09/18/22  Renal:  09/23/21>>started CVVH 10/10/21>>stopped CVVH 10/13/21>> restarted CVVH 10/15/21>> stopped CVVH  10/16/21>>started HD (hemodialysis)  Infection:  09/10/21>> blood cultures >> No growth 5 days; final 09/12/21>> urine culture >> No growth; final  09/25/21>>blood cultures>>negative  09/27/21>>pleural effusion>>negative, final 10/02/21>> pleural effusion >> no growth; final    Patient Education: Discharge education completed with patient and his wife 10/21/21.Marland Kitchen  Discussed possible discharge date of Friday, 10/24/21 with patient and wife. Pt feels comfortable about how to reach VAD Coordinator 24/7 using pager. Delivered loaner home equipment. Will ask patient to return to [facility] when his equipment arrives so we can exchange. Pt verbalized understanding and agreement to same.  Written instructions included in hospital discharge summary re: OP dialysis treatment, device and VAD Clinic appointments.    Plan/Recommendations:  Page VAD coordinator for drive line or equipment concerns 2.  Weekly change per Nurse Davonna Belling or VAD coordinator on Monday and Thursday. 3.  Discharge home today.    Zada Girt RN [place] Coordinator  Office: [phone]  24/7 Pager: [phone]

## 2021-10-24 NOTE — Progress Notes (Addendum)
Patient ID: Isaac [name], male   DOB: 06-11-1951, 71 y.o.   MRN: [id number]    Advanced Heart Failure VAD Team Note  PCP-Cardiologist: Dr. Aundra Dubin   Subjective:    12/19 RHC- RA 7, PA 39/14 (25), PCWP 11, CO 6.4 CI 3. Thermo 3.5 1.8. Lasix drip stopped after cath. 12/20 Swan removed.  Norepi 3 mcg added.  12/21 Fever. Blood CX drawn. UA + leukocytes. Started on vanc and cefepime. Diuresed with IV lasix + metolazone.  12/23 Impella 5.5 placed 12/28 HM III LVAD, ICD leads plastered to tricuspid valve with severe TR, valve not replaced and some improvement in TR with LVAD placement.  12/29 Extubated 01/03 Started CRRT 01/07  L thoracentesis and placement of [facility] 01/09 LVAD speed increased to 5600 rpm 01/10 Milrinone stopped. Amio gtt>>PO. Completed 5 day course Cefepime.  01/12 Ramp echo done: Moderate lateral pericardial effusion w/o tamponade, RV larger with severe dilation/moderate dysfunction and left shift of the septum, LV small and undershifted.  TR severe.  LVAD speed decreased to 5000 rpms at 2 different sessions.  Also had thoracentesis on left with 1.3 L off.  01/13 TFs begun 01/16 echo reviewed: EF 25-30%, LV larger than prior but septum still with left shift, RV moderately dilated and moderately dysfunctional, moderate-severe TR, mod-large localized pericardial effusion lateral to the LV.  1/18 Ramp Echo with speed decreased to 4900  1/21 CVVHD stopped 1/23 delay in iHD due to staffing, developed increased dyspnea + volume overload CVP >20 and low flow alarms. VAD speed reduced to 4800 then increased back to 5000. CVVH restarted. Milrinone restarted 0.25.  1/25 CVVH stopped 1/26 Tolerated initial iHD.  1/27 Echo done, speed increased to 5200 rpm. Stopped milrinone.  1/29 Midodrine stopped. Sildenafil 20 tid started  Feels well today. No complaints. Denies dyspnea. No CP. Planning for HD today prior to d/c.   INR 2.0 today. LDH 184  Mg 1.8   LVAD INTERROGATION:   HeartMate III LVAD:   Flow 3.8 liters/min, speed 5200, power 3.4 PI 3.4  Multiple PI events.  VAD interrogated personally. Parameters stable.     Objective:    Vital Signs:   Temp:  [98 F (36.7 C)-98.7 F (37.1 C)] 98.4 F (36.9 C) (02/03 0327) Pulse Rate:  [60-181] 63 (02/03 0327) Resp:  [15-24] 15 (02/03 0327) BP: (81-109)/(63-95) 93/71 (02/03 0327) SpO2:  [92 %-97 %] 93 % (02/03 0327) Weight:  [69.4 kg] 69.4 kg (02/03 0526) Last BM Date: 10/21/21 Mean arterial Pressure 70s-80s    Intake/Output:   Intake/Output Summary (Last 24 hours) at 10/24/2021 0713 Last data filed at 10/23/2021 1227 Gross per 24 hour  Intake 240 ml  Output --  Net 240 ml     Physical Exam   General: Well appearing, no distress.   HEENT: Normal. Neck: Supple, JVP 8 cm. Carotids OK.  Cardiac:  Mechanical heart sounds with LVAD hum present.  Lungs:  Clear bilaterally. No wheezing  Abdomen:  soft, nontender, non distended no HSM. No bruits or masses. Good bowel sounds  LVAD exit site: Well-healed and incorporated. Dressing dry and intact. No erythema or drainage. Stabilization device present and accurately applied. Driveline dressing changed daily per sterile technique. Extremities:  Warm and dry. No cyanosis, clubbing, rash, or edema. + TED hoses  Neuro:  Alert & oriented x 3. Cranial nerves grossly intact. Moves all 4 extremities w/o difficulty. Affect pleasant    Telemetry    A Paced 60s (personally reviewed)  Labs  Basic Metabolic Panel: Recent Labs  Lab 10/20/21 0458 10/20/21 1451 10/21/21 0405 10/22/21 0516 10/22/21 0517 10/23/21 0657 10/23/21 0658 10/24/21 0500  NA 134* 136 136  --  135  --  137 136  K 4.0 3.2* 3.7  --  3.5  --  4.2 4.3  CL 97* 100 100  --  100  --  100 100  CO2 25 26 26   --  24  --  27 26  GLUCOSE 103* 95 95  --  100*  --  77 85  BUN 29* 12 20  --  30*  --  18 29*  CREATININE 4.56* 2.70* 3.69*  --  4.77*  --  3.40* 4.29*  CALCIUM 8.5* 8.8* 8.6*  --  8.5*   --  8.7* 9.0  MG 2.0  --  1.8 1.8  --  1.9  --  1.8  PHOS  --  3.4 4.7*  --  5.2*  --  3.7 4.2    Liver Function Tests: Recent Labs  Lab 10/20/21 1451 10/21/21 0405 10/22/21 0517 10/23/21 0658 10/24/21 0500  ALBUMIN 2.5* 2.2* 2.2* 2.5* 2.6*   No results for input(s): LIPASE, AMYLASE in the last 168 hours. No results for input(s): AMMONIA in the last 168 hours.   CBC: Recent Labs  Lab 10/19/21 0337 10/20/21 0458 10/21/21 0405 10/22/21 0516 10/23/21 0657  WBC 9.3 8.8 7.6 8.3 6.5  HGB 9.0* 8.6* 8.0* 8.4* 8.5*  HCT 28.7* 27.2* 26.0* 27.0* 27.4*  MCV 95.3 93.2 94.2 94.4 94.5  PLT 590* 523* 440* 412* 384    INR: Recent Labs  Lab 10/20/21 0458 10/21/21 0405 10/22/21 0516 10/23/21 0657 10/24/21 0500  INR 1.7* 1.9* 1.9* 1.8* 2.0*    Other results:   Imaging   No results found.   Medications:     Scheduled Medications:  amiodarone  200 mg Oral Daily   Chlorhexidine Gluconate Cloth  6 each Topical Q0600   darbepoetin (ARANESP) injection - NON-DIALYSIS  150 mcg Subcutaneous Q Mon-1800   docusate sodium  100 mg Oral BID   feeding supplement  237 mL Oral TID BM   mouth rinse  15 mL Mouth Rinse BID   midodrine  5 mg Oral Once per day on Mon Wed Fri   mirtazapine  7.5 mg Oral QHS   montelukast  5 mg Oral QHS   multivitamin  1 tablet Oral QHS   pantoprazole  40 mg Oral Daily   rosuvastatin  10 mg Oral Daily   senna  2 tablet Oral QHS   sildenafil  20 mg Oral TID   sodium chloride flush  10-40 mL Intracatheter Q12H   sodium chloride flush  3 mL Intravenous Q12H   Warfarin - Pharmacist Dosing Inpatient   Does not apply q1600    Infusions:  sodium chloride Stopped (09/18/21 0803)   sodium chloride Stopped (10/02/21 0900)   sodium chloride     sodium chloride     promethazine (PHENERGAN) injection (IM or IVPB) Stopped (10/10/21 1755)    PRN Medications: sodium chloride, sodium chloride, sodium chloride, acetaminophen, albuterol, alteplase, Gerhardt's  butt cream, heparin, heparin, lidocaine (PF), lidocaine-prilocaine, lip balm, ondansetron (ZOFRAN) IV, oxyCODONE, pentafluoroprop-tetrafluoroeth, polyethylene glycol, promethazine (PHENERGAN) injection (IM or IVPB), sodium chloride flush, sodium chloride flush, traMADol   Assessment/Plan:    1. Acute on chronic systolic CHF:  Long-standing cardiomyopathy.  [place].  Echo this admission with EF < 20%, severe LV dilation, restrictive diastolic function, moderate RV dysfunction,  moderate MR, mod-severe TR. Cause of cardiomyopathy is uncertain.  He has a history of CAD, but I do not think that the described CAD from the past could explain his cardiomyopathy, but CAD could have progressed.  With difficulty tolerating GDMT/need for midodrine and cardiorenal syndrome as well as profound volume overload + NYHA class IV symptoms,  concerned for low output HF. Co-ox off milrinone was 36%, milrinone started and increased to 0.375 but CO remained low. NE added and Impella 5.5 placed 12/23. EF 10% on TEE 12/23. s/p HM III VAD on 12/28.  Ramp echo 01/03 with EF 20-25%, RV moderately reduced, septum mid-line.  Ramp echo again on 1/12 showed severely dilated/moderately dysfunctional RV with severe TR and left-shifted septum with small LV.  Speed decreased on 1/12 to 5000 rpm over 2 sessions due to RV failure and milrinone 0.25 + NE 3 restarted.  Echo repeated on 1/16, RV looks better and LV larger but still with left shift of the septum.  Able to increase CVVH, weight down significantly.  Echo again on 1/18, speed decreased to 4900 with RV mildly dysfunctional and severely enlarged with severe TR.  CVVHD stopped 1/20.  1/23 delay in iHD due to staffing, developed increased dyspnea + volume overload CVP >20 and low flow alarms. VAD speed reduced to 4800. CVVH restarted. Milrinone restarted 0.25. Speed increased back to 5000 rpm and then to 5200.  Volume much improved. CVVH stopped 1/25 and now getting iHD.   Milrinone has been stopped, he is also off midodrine except for a dose prior to HD.  Stable LVAD parameters. - Continue midodrine 5 mg MWF pre-HD.  - Next HD planned for today  - Continue  sildenafil 20 tid for RV.   - Warfarin for INR 2-2.5.  INR 2.0 today, discussed dosing with PharmD personally.  - Continue 81 mg aspirin 2. Tricuspid regurgitation:  Tricuspid repair not done at time of VAD d/t proximity of ICD wires and hypotension during surgery.  He has severe TR.  3. Atrial fibrillation: A-paced. - Continue amiodarone to 200 mg daily . 4. AKI on CKD stage 3>>ESRD: Gradual rise (was about 1.7 pre-op) since surgery.  He has not been hypotensive post-op, suspect intra-op hypotension led to development of ATN => urine sediment looked like ATN per renal. Got CVVH. TDC placed 1/7. Now off CVVH and now with iHD, tolerating so far.  - Tolerating iHD - Will give midodrine on the mornings of HD.   - Plan to continue HD MWF schedule. Outpatient seat arranged. Appreciated nephrology assistance  5. CAD: History of PCI to OM1 in 2007 and RCA in 2013.  No CP or ACs.  - Deferred cardiac cath due to AKI and plan for VAD - Continue Crestor.  6. ID: Had fever 12/21, PCT 1.03.  Blood cultures negative. ?Phlebitis at IV site. Post-op fever with elevated WBCs, PCT 2.63 -> 1.78.  He completed course of cefepime.  - Resolved.  7. Constipation: Resolved.  8. Hyponatremia: Hypervolemic hyponatremia.  Controlled via dialysis.  9. Anemia: 1 unit PRBCs on 1/12,  1 unit PRBs 1/13, 1 unit 1/16. No overt bleeding, ?losses via CVVH.  Hgb 8.5, no evidence bleeding - Transfuse for Hgb <7.5  - Received IV iron x 5 doses per nephrology (finished 1/20) 10. Confusion/delirium: Initially post-op and started on Precedex.  Resolved.  11. F/E/N: Improving po intake.  - Added remeron to help with appetite.  12. Left pleural effusion: - s/p thoracentesis 1/7 - s/p thoracentesis  1/12. 13. Pericardial effusion: Stable moderate  pericardial effusion, primarily localized to along the lateral wall of the LV.  Does not appear to be affecting the RV/doubt tamponade.  Discussed after 1/12 and 1/18 echo with Dr. Cyndia Bent, conservative management for now.  14. Right great toe pain: - ? Possible gout.  15. Hyperkalemia - Resolved w/ HD and lokelma.  16. Deconditioning:  - Improving, plan for home health/PT.   Plan d/c home today after HD with 1st outpatient HD on Monday.   Lyda Jester, PA-C  10/24/2021 7:13 AM  Patient seen with PA, agree with the above note.   ICD therapies turned back on yesterday.  Getting HD today.  No complaints.   INR 2.   General: Well appearing this am. NAD.  HEENT: Normal. Neck: Supple, JVP 7-8 cm. Carotids OK.  Cardiac:  Mechanical heart sounds with LVAD hum present.  Lungs:  CTAB, normal effort.  Abdomen:  NT, ND, no HSM. No bruits or masses. +BS  LVAD exit site: Well-healed and incorporated. Dressing dry and intact. No erythema or drainage. Stabilization device present and accurately applied. Driveline dressing changed daily per sterile technique. Extremities:  Warm and dry. No cyanosis, clubbing, rash, or edema.  Neuro:  Alert & oriented x 3. Cranial nerves grossly intact. Moves all 4 extremities w/o difficulty. Affect pleasant    Patient is ready for discharge after HD today.  Home HD has been arranged.  All teaching has been completed.  Close followup in HF clinic.   Loralie Champagne 10/24/2021 9:00 AM

## 2021-10-24 NOTE — Progress Notes (Signed)
Pt to d/c to home today. Pt will start at [facility] on Monday. Spoke to [place], [place] manager, at [place] to make clinic aware pt for d/c today and pt will start on Monday. Raquel Sarna confirms clinic is prepared to start pt on Monday with LVAD. Clinic also advised that MD has declared pt ESRD. Clinicals faxed to [facility] admissions today to provide that documentation. Spoke to pt's wife via phone to provide a reminder that pt will need to arrive at clinic on Monday at 10;35. Schedule letter given to pt yesterday and appt details added to AVS. Pt's wife has no further questions at this time. Contacted renal PA regarding clinics need for orders.   Melven Sartorius Renal Navigator [phone]

## 2021-10-24 NOTE — Progress Notes (Signed)
OT Cancellation Note  Patient Details Name: Isaac [name] MRN: [id number] DOB: Nov 10, 1950   Cancelled Treatment:    Reason Eval/Treat Not Completed: Patient at procedure or test/ unavailable (HD)  Jeri Modena 10/24/2021, 8:47 AM  Fleeta Emmer, OTR/L  Acute Rehabilitation Services Pager: [phone] Office: [phone] .

## 2021-10-25 ENCOUNTER — Telehealth (HOSPITAL_COMMUNITY): Payer: Self-pay | Admitting: Nephrology

## 2021-10-25 NOTE — Telephone Encounter (Signed)
Transition of care contact from inpatient facility  Date of discharge: 10/24/21 Date of contact: 10/25/21 Method: Phone Spoke to: Patient  Patient contacted to discuss transition of care from recent inpatient hospitalization. Patient was admitted to [facility] from 09/06/21 - 10/24/21 with discharge diagnosis of HFrEF -> LVAD placement, A-fib, cardiorenal syndrome -> AKI without recovery, deemed ESRD.   Medication changes were reviewed. He is clear on everything.  Patient will follow up with his/her outpatient HD unit on: Monday morning, knows where to go and what time.  No acute needs identified.  He thinks he left his phone charger at the hospital -[place] station phone # given to check.  Veneta Penton, PA-C [employer] Pager [phone]

## 2021-10-27 ENCOUNTER — Encounter (HOSPITAL_COMMUNITY): Payer: Self-pay | Admitting: Unknown Physician Specialty

## 2021-10-29 ENCOUNTER — Telehealth (HOSPITAL_COMMUNITY): Payer: Self-pay | Admitting: *Deleted

## 2021-10-29 ENCOUNTER — Other Ambulatory Visit (HOSPITAL_COMMUNITY): Payer: Self-pay | Admitting: Unknown Physician Specialty

## 2021-10-29 DIAGNOSIS — Z7901 Long term (current) use of anticoagulants: Secondary | ICD-10-CM

## 2021-10-29 DIAGNOSIS — Z95811 Presence of heart assist device: Secondary | ICD-10-CM

## 2021-10-29 NOTE — Telephone Encounter (Signed)
Attempted to call both pt and his wife regarding need to bring loaner VAD equipment to clinic visit tomorrow. Pt's wife's phone does not have a voicemail set up, and pt's voicemail is full.   Emerson Monte RN [place] Coordinator  Office: [phone]  24/7 Pager: [phone]

## 2021-10-30 ENCOUNTER — Ambulatory Visit (HOSPITAL_COMMUNITY)
Admit: 2021-10-30 | Discharge: 2021-10-30 | Disposition: A | Discharge status: Home / Self Care | Payer: Medicare HMO | Attending: Cardiology | Admitting: Cardiology

## 2021-10-30 ENCOUNTER — Other Ambulatory Visit: Payer: Self-pay

## 2021-10-30 ENCOUNTER — Telehealth (HOSPITAL_COMMUNITY): Payer: Self-pay | Admitting: *Deleted

## 2021-10-30 ENCOUNTER — Ambulatory Visit (HOSPITAL_COMMUNITY): Payer: Self-pay | Admitting: Pharmacist

## 2021-10-30 VITALS — BP 83/68 | HR 60 | Temp 97.8°F | Ht 69.0 in | Wt 161.8 lb

## 2021-10-30 DIAGNOSIS — I132 Hypertensive heart and chronic kidney disease with heart failure and with stage 5 chronic kidney disease, or end stage renal disease: Secondary | ICD-10-CM | POA: Insufficient documentation

## 2021-10-30 DIAGNOSIS — I480 Paroxysmal atrial fibrillation: Secondary | ICD-10-CM | POA: Diagnosis not present

## 2021-10-30 DIAGNOSIS — I5022 Chronic systolic (congestive) heart failure: Secondary | ICD-10-CM

## 2021-10-30 DIAGNOSIS — Z452 Encounter for adjustment and management of vascular access device: Secondary | ICD-10-CM | POA: Diagnosis not present

## 2021-10-30 DIAGNOSIS — N179 Acute kidney failure, unspecified: Secondary | ICD-10-CM | POA: Insufficient documentation

## 2021-10-30 DIAGNOSIS — N186 End stage renal disease: Secondary | ICD-10-CM | POA: Diagnosis not present

## 2021-10-30 DIAGNOSIS — Y838 Other surgical procedures as the cause of abnormal reaction of the patient, or of later complication, without mention of misadventure at the time of the procedure: Secondary | ICD-10-CM | POA: Diagnosis not present

## 2021-10-30 DIAGNOSIS — Z992 Dependence on renal dialysis: Secondary | ICD-10-CM | POA: Diagnosis not present

## 2021-10-30 DIAGNOSIS — I2510 Atherosclerotic heart disease of native coronary artery without angina pectoris: Secondary | ICD-10-CM | POA: Diagnosis not present

## 2021-10-30 DIAGNOSIS — Z8249 Family history of ischemic heart disease and other diseases of the circulatory system: Secondary | ICD-10-CM | POA: Diagnosis not present

## 2021-10-30 DIAGNOSIS — Z79899 Other long term (current) drug therapy: Secondary | ICD-10-CM | POA: Diagnosis not present

## 2021-10-30 DIAGNOSIS — T8189XS Other complications of procedures, not elsewhere classified, sequela: Secondary | ICD-10-CM | POA: Insufficient documentation

## 2021-10-30 DIAGNOSIS — I3139 Other pericardial effusion (noninflammatory): Secondary | ICD-10-CM | POA: Diagnosis not present

## 2021-10-30 DIAGNOSIS — I4891 Unspecified atrial fibrillation: Secondary | ICD-10-CM | POA: Diagnosis not present

## 2021-10-30 DIAGNOSIS — I071 Rheumatic tricuspid insufficiency: Secondary | ICD-10-CM | POA: Insufficient documentation

## 2021-10-30 DIAGNOSIS — Z95811 Presence of heart assist device: Secondary | ICD-10-CM | POA: Diagnosis not present

## 2021-10-30 DIAGNOSIS — Z7901 Long term (current) use of anticoagulants: Secondary | ICD-10-CM | POA: Insufficient documentation

## 2021-10-30 DIAGNOSIS — Z955 Presence of coronary angioplasty implant and graft: Secondary | ICD-10-CM | POA: Insufficient documentation

## 2021-10-30 LAB — CBC
HCT: 28.1 % — ABNORMAL LOW (ref 39.0–52.0)
Hemoglobin: 8.9 g/dL — ABNORMAL LOW (ref 13.0–17.0)
MCH: 29.4 pg (ref 26.0–34.0)
MCHC: 31.7 g/dL (ref 30.0–36.0)
MCV: 92.7 fL (ref 80.0–100.0)
Platelets: 309 10*3/uL (ref 150–400)
RBC: 3.03 MIL/uL — ABNORMAL LOW (ref 4.22–5.81)
RDW: 17.4 % — ABNORMAL HIGH (ref 11.5–15.5)
WBC: 7.4 10*3/uL (ref 4.0–10.5)
nRBC: 0 % (ref 0.0–0.2)

## 2021-10-30 LAB — BASIC METABOLIC PANEL
Anion gap: 11 (ref 5–15)
BUN: 12 mg/dL (ref 8–23)
CO2: 32 mmol/L (ref 22–32)
Calcium: 8.8 mg/dL — ABNORMAL LOW (ref 8.9–10.3)
Chloride: 95 mmol/L — ABNORMAL LOW (ref 98–111)
Creatinine, Ser: 2.97 mg/dL — ABNORMAL HIGH (ref 0.61–1.24)
GFR, Estimated: 22 mL/min — ABNORMAL LOW (ref >60)
Glucose, Bld: 102 mg/dL — ABNORMAL HIGH (ref 70–99)
Potassium: 3.3 mmol/L — ABNORMAL LOW (ref 3.5–5.1)
Sodium: 138 mmol/L (ref 135–145)

## 2021-10-30 LAB — PROTIME-INR
INR: 2 — ABNORMAL HIGH (ref 0.8–1.2)
Prothrombin Time: 22.8 seconds — ABNORMAL HIGH (ref 11.4–15.2)

## 2021-10-30 LAB — LACTATE DEHYDROGENASE: LDH: 206 U/L — ABNORMAL HIGH (ref 98–192)

## 2021-10-30 NOTE — Progress Notes (Addendum)
Patient presents for hospital d/c f/u in [facility] today with wife. Reports no problems with VAD equipment or concerns with drive line.  Patient arrived ambulatory using rolling walker. Wife concerned last night after day of dialysis, multiple appointments since discharge, and switching from batteries to MPU, patient stated "I can't do this". She verbalized wish that VAD team had been more honest with potential negative outcomes. She says she was not here for a lot of the education/conversations with patient and was not aware of everything that would be required by patient and herself post VAD implant. She also says the patient that spoke with Mr. Murrell, was "too positive" and she and Mr. Schowalter needed more negative outcomes reviewed with them. Explained entire team including surgeon, cardiologist, social worker, [place], [place] met with patient to educate him. Major risks vs benefits are explained in more detail by surgeons and cardiologist. Patient and wife completed Quality of Care and Educational Satisfaction surveys per protocol.  Patient reports he has occasional orthostatic dizziness, lightheadedness. Says he gets up slowly to assess and sits back down if any of these symptoms occur. He is drinking < 1 liter/day. Discussed with Dr. Aundra Dubin who advised him to increase fluid intake to @ 2 liters/day.   EKG obtained with AV paced rhythm 60. [employer]) clinical rep in per Dr. Aundra Dubin with ICD interrogation. Increased base rate to 70, rate response on.   Wife reports patient has not been using Singulair inhaler; discontinued per Dr. Aundra Dubin.   Wife requested more information on kidney function, lab results, dialysis. Says she and her husband have never been told anything about all of this. Dr. Joelyn Oms is managing patient's dialysis outpatient. Will call [facility] and request appt with Dr. Joelyn Oms for patient per Dr. Aundra Dubin.   Dr. Aundra Dubin mentioned Cardiac Rehab, pt and  wife refused, "too many appointments".   Audry Riles, PharmD met with patient/wife to discuss INR results today and warfarin dosage. All questions answered. Asked if they would be interested in home INR machine, both agreed. Will send packet to [facility] for insurance clearance. Informed patient, company will reach out to them and review findings. Pt/wife will then need to make decision about home INR or find local lab for INR testing. Both verbalized understanding of same.   Patient asked about missing items since discharge. Items include phone charger, silver bracelet, gold earrings, and a gold rope chain. Both say these items did not transfer with him to [facility] and feel items are in [place]. Called [facility] and was told to contact Security. Attempted to call Security with no answer. Provided patient and wife with 2[facility] manager name and contact information: Mickie Kay at [phone].   Vital Signs:  Temp: 97.8 Doppler Pressure: 70/0 Automatc BP: 83/68 (77) HR:  60 SPO2: UTO   Weight: 16138 lb w/o eqt Hospital discharge weight: 170 lb Home weights: 151 - 153 lbs BMI today 23.8 today   VAD Indication: Destination Therapy due to age and kidney disease``    LVAD assessment:  HM III: Speed: 5200 rpms Flow: 4.5 Power: 3.6w    PI: 2.2 Alarms: none Events: 75 today; >50 yesterday  Fixed speed: 5200 Low speed limit: 4900  Primary controller: back up battery due for replacement in 31 months Secondary controller:  back up battery due for replacement in 31 months   I reviewed the LVAD parameters from today and compared the results to the patient's prior recorded data. LVAD interrogation was NEGATIVE for significant  power changes, NEGATIVE for clinical alarms and STABLE for PI events/speed drops. No programming changes were made and pump is functioning within specified parameters. Pt is performing daily controller and system monitor self tests along with completing weekly and monthly maintenance for LVAD  equipment.   LVAD equipment check completed and is in good working order. Back-up equipment present. Charged back up battery and performed self-test on equipment.    Annual Equipment Maintenance on UBC/PM was performed on 10/2021.    Exit Site Care: VAD dressing and anchor removed and site care performed using sterile technique. Drive line exit site cleaned with Chlora prep applicators x 2, rinsed with saline, and allowed to dry before gauze dressing and Aquacel silver strip applied. Exit site unincorporated, the velour is fully implanted at exit site. Small amount of serous drainage with no redness, tenderness, or foul odor noted. Drive line anchor re-applied. Pt denies fever or chills. Provided patient with 7 daily kits and 5 anchors.    Device:  [employer] dual  Therapies: on Pacing: AV paced 60 >> increased to 70  Last check: Latitude today per Joey Deakins   BP & Labs:  MAP 70  - Doppler is reflecting MAP   Hgb 8.9 - No S/S of bleeding. Specifically denies melena/BRBPR or nosebleeds.   LDH stable at 206 with established baseline of 200 - 300. Denies tea-colored urine. No power elevations noted on interrogation.   Patient Instructions:  Increase fluid intake to @ 2 liters/day You may stop Singulair inhaler We will ask Dr. Joelyn Oms to make an appointment with you (Kidney doctor): [facility] [address] [place], [place] [address] [phone] 4.  Start using gauze dressing kit and change dressing twice weekly. 5.  Return to [facility] in two weeks.   Zada Girt, RN VAD Coordinator    Office: [phone] 24/7 Emergency VAD Pager: [phone]  Cardiology: Dr. Aundra Dubin PCP: [name], MD  Follow up for Heart Failure/LVAD: 71 y.o. with history of CAD, HTN, atrial fibrillation, and chronic systolic CHF returns for followup of CHF/LVAD.   Patient has had a long-standing cardiomyopathy, EF had been in the 25% range for years.  He had PCI to OM1 in 2007 and RCA  in 2013.  From his report, these episodes do not sound like ACS events.  Echo in 12/22 showed EF < 20%, severe LV dilation, restrictive diastolic function, moderate RV dysfunction, moderate MR, mod-severe TR. HF was been complicated by CKD stage 3. He was also noted on device interrogation to have been in atrial fibrillation persistently since 10/22.  He was admitted at [facility] in 12/22.  BP was low, his BP-active meds were stopped and midodrine was begun.  He was diuresed and discharged home.    He reports ongoing severe dyspnea and was admitted again in 12/22 after he stood up then passed out at home.  Device interrogation showed run of VT treated x 2 with ATP possibly around the time of syncope.  RHC in 12/22 showed low output HF and he developed cardiogenic shock. Impella 5.5 was placed.  On 09/17/21, HM3 LVAD was placed, ICD leads were plastered to the tricuspid valve with severe TR, the valve was not replaced.  The patient had significant interoperative hypotension.  Post-operatively, he developed progressive renal failure and was started on CVVH then iHD.  He had a bumpy post-op course with renal failure and RV failure, but was ultimately able to discharge home on 10/24/21.   He has had 2 outpatient  HD sessions which he has tolerated reasonably well.  He takes midodrine pre-HD.  MAP 77 today, having frequent PI events.  He has occasional orthostatic symptoms but not severe and no falls.  He walks around his house and out to the car without dyspnea.  No orthopnea/PND.  He is getting home PT.  Currently a-pacing with rate in 70s.   Denies LVAD alarms.  Denies driveline trauma, erythema or drainage.  Denies ICD shocks.   Reports taking Coumadin as prescribed and adherence to anticoagulation based dietary restrictions.  Denies bright red blood per rectum or melena, no dark urine or hematuria.    PMH: 1. HTN 2. Hyperlipidemia 3. CAD: H/o PCI to [place] 2007 and PCI to RCA in 2013.  Uncertain of circumstances  but do not sound like ACS events.  4. ESRD 5. Atrial fibrillation: Paroxysmal 6. Chronic systolic CHF: Long-standing cardiomyopathy with EF in the 25% range.  Cause uncertain, has history of PCI to RCA and OM1, but degree of CAD does not seem to explain severity of cardiomyopathy. Father had history of CAD/CHF.  Not heavy drinker. Has [employer] ICD.  - Echo (12/22): EF < 20%, severe LV dilation, restrictive diastolic function, moderate RV dysfunction, moderate MR, mod-severe TR.  - 12/22 cardiogenic shock with Impella 5.5 placement then Heartmate 3 LVAD.  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  7. VT 8. RV failure/severe TR   Current Outpatient Medications  Medication Sig Dispense Refill   albuterol (VENTOLIN HFA) 108 (90 Base) MCG/ACT inhaler Inhale 2 puffs into the lungs every 6 (six) hours as needed.     amiodarone (PACERONE) 200 MG tablet Take 1 tablet (200 mg total) by mouth daily. 30 tablet 6   midodrine (PROAMATINE) 10 MG tablet Take Monday, Wednesday, and Friday 30 min before Hemo-Dialysis session 40 tablet 6   mirtazapine (REMERON) 7.5 MG tablet Take 1 tablet (7.5 mg total) by mouth at bedtime. 30 tablet 6   Multiple Vitamins-Minerals (CERTAVITE/ANTIOXIDANTS) TABS Take 1 tablet by mouth at bedtime. 30 tablet 0   pantoprazole (PROTONIX) 40 MG tablet Take 1 tablet (40 mg total) by mouth daily. 30 tablet 6   rosuvastatin (CRESTOR) 10 MG tablet Take 1 tablet (10 mg total) by mouth daily. 30 tablet 6   sildenafil (REVATIO) 20 MG tablet Take 1 tablet (20 mg total) by mouth 3 (three) times daily. 90 tablet 6   warfarin (COUMADIN) 2 MG tablet Take 1 tablet (2 mg total) by mouth daily at 4 PM. 30 tablet 11   No current facility-administered medications for this encounter.    Mushroom extract complex and Neosporin [neomycin-bacitracin zn-polymyx]  REVIEW OF SYSTEMS: All systems negative except as listed in HPI, PMH and Problem list.   LVAD  INTERROGATION:  See LVAD nurse's note above.   I reviewed the LVAD parameters from today, and compared the results to the patient's prior recorded data.  No programming changes were made.  The LVAD is functioning within specified parameters.  The patient performs LVAD self-test daily.  LVAD interrogation was negative for any significant power changes, alarms or PI events/speed drops.  LVAD equipment check completed and is in good working order.  Back-up equipment present.   LVAD education done on emergency procedures and precautions and reviewed exit site care.    Vitals:   10/30/21 1136 10/30/21 1138  BP: (!) 70/0 (!) 83/68  Pulse: 60 60  Temp: 97.8 F (36.6 C) 97.8 F (36.6 C)  TempSrc: Oral Oral  SpO2:  93%  Weight: 73.4 kg (161 lb 12.8 oz) 73.4 kg (161 lb 12.8 oz)  Height:  5' 9"  (1.753 m)  MAP 77  Physical Exam: GENERAL: Well appearing, male who presents to clinic today in no acute distress. HEENT: normal  NECK: Supple, JVP 8-9 cm.  2+ bilaterally, no bruits.  No lymphadenopathy or thyromegaly appreciated.   CARDIAC:  Mechanical heart sounds with LVAD hum present.  LUNGS:  Clear to auscultation bilaterally.  ABDOMEN:  Soft, round, nontender, positive bowel sounds x4.     LVAD exit site: well-healed and incorporated.  Dressing dry and intact.  No erythema or drainage.  Stabilization device present and accurately applied.  Driveline dressing is being changed daily per sterile technique. EXTREMITIES:  Warm and dry, no cyanosis, clubbing, rash. 1+ ankle edema.   NEUROLOGIC:  Alert and oriented x 4.  Gait steady.  No aphasia.  No dysarthria.  Affect pleasant.     ASSESSMENT AND PLAN: 1. Chronic systolic CHF:  Long-standing cardiomyopathy.  [place].  Echo in 12/22 with EF < 20%, severe LV dilation, restrictive diastolic function, moderate RV dysfunction, moderate MR, mod-severe TR. Cause of cardiomyopathy is uncertain.  He has a history of CAD, but I do not think that the  described CAD from the past could explain his cardiomyopathy, but CAD could have progressed.  He was admitted in 12/22 with cardiogenic shock and Impella 5.5. s/p HM III VAD on 09/17/22.  Post-op course complicated by AKI and RV failure. Now on HD for volume management, mildly volume overloaded on exam today.  NYHA class II-III, not very active yet but doing PT at home.  MAP good today, occasional orthostatic symptoms.  - Continue midodrine 10 mg MWF pre-HD.  - Continue  sildenafil 20 tid for RV.   - Warfarin for INR 2-2.5.  Check INR today.  - He is now off ASA.  - He is a-pacing at 60, I increased his a-pacing rate to 70 bpm today.  - Multiple PI events, would increase fluid intake at bit. - Wear compression stockings with mild orthostasis.  2. Tricuspid regurgitation:  Tricuspid repair not done at time of VAD due to proximity of ICD wires and hypotension during surgery.  He has severe TR.  3. Atrial fibrillation: A-paced today. - Continue amiodarone to 200 mg daily, check LFTs and TSH.  He will need a regular eye exam. 4. ESRD: Suspect intra-op hypotension at the time of LVAD placement led to development of ATN => urine sediment looked like ATN per renal. Had initial CVVH and transitioned over to HD.  No making much urine.  - Continue iHD.  - Will give midodrine on the mornings of HD.    5. CAD: History of PCI to OM1 in 2007 and RCA in 2013.  No CP or ACs.  - Deferred cardiac cath in 12/22 due to AKI and plan for VAD - Continue Crestor.  6. Pericardial effusion: Will follow the loculated lateral wall effusion by echo, will re-order next appointment.   Mobilize, continue home PT.   Loralie Champagne 10/30/2021

## 2021-10-30 NOTE — Progress Notes (Signed)
LVAD INR 

## 2021-10-30 NOTE — Telephone Encounter (Signed)
Called [facility] per Dr. Aundra Dubin to request appointment for Mr. Mccook and his wife. Was told nephrologist does not see dialysis patients in office. Was told to contact manager to arrange meeting with nephrologist.  [facility] center and spoke with Abigail Butts Orthoptist). Patient is being managed by Dr. Justin Mend. She scheduled meeting with patient/wife for Monday 11/10/21 during his dialysis treatment.  Zada Girt RN, [place] Coordinator [phone]

## 2021-10-30 NOTE — Patient Instructions (Addendum)
Increase fluid intake to @ 2 liters/day You may stop Singulair inhaler We will ask Dr. Joelyn Oms to make an appointment with you (Kidney doctor): [facility] [address] [place], [place] [address] [phone] 4.  Start using gauze dressing kit and change dressing twice weekly. 5.  Return to [facility] in two weeks.

## 2021-11-03 ENCOUNTER — Telehealth (HOSPITAL_COMMUNITY): Payer: Self-pay | Admitting: *Deleted

## 2021-11-03 ENCOUNTER — Other Ambulatory Visit (HOSPITAL_COMMUNITY): Payer: Self-pay | Admitting: *Deleted

## 2021-11-03 ENCOUNTER — Encounter (HOSPITAL_COMMUNITY): Payer: Medicare HMO

## 2021-11-03 DIAGNOSIS — Z7901 Long term (current) use of anticoagulants: Secondary | ICD-10-CM

## 2021-11-03 DIAGNOSIS — Z95811 Presence of heart assist device: Secondary | ICD-10-CM

## 2021-11-03 DIAGNOSIS — R110 Nausea: Secondary | ICD-10-CM

## 2021-11-03 MED ORDER — ONDANSETRON HCL 4 MG PO TABS: 4.0000 mg | ORAL_TABLET | Freq: Three times a day (TID) | ORAL | 3 refills | Status: DC | PRN

## 2021-11-03 NOTE — Addendum Note (Signed)
Addended by: Emerson Monte B on: 11/03/2021 09:58 AM   Modules accepted: Orders

## 2021-11-03 NOTE — Telephone Encounter (Addendum)
Received page from pt's wife on Sunday reporting pt's weight is up 5 lbs, he has had a few episodes of diarrhea, has vomited "about a cup", and she feels his breathing is more labored than previously. Pt reports he feels weak, but feels this may be related to multiple appts and lack of rest since discharge. Asked pt if he feels short of breath, and reports "discomfort behind my lungs" but does not feel short of breath at this time. Wife reports temp 99.5 (this morning was 98.5.) She is concerned as she reports home health team member that visited last week called and reported covid exposure. They do not have home covid test available to test- recommended they get home test from pharmacy to check. Discussed clinic visit first thing Monday morning vs going to ER. Pt does not feel he is sick enough to go to ER, and would like to be seen in clinic. Advised if symptoms worsen and they feel he needs to go to ER, to go, and page VAD coordinator to let know of plan change. Advised he may take Imodium if needed for diarrhea. Pt and wife verbalized understanding of all the above instructions.   Addendum: Spoke with Mrs Hannum this morning. She reports they had a "rough" night with nausea, a few episodes of emesis, and "rattling in his chest." But reports he is feeling a little better this morning, and that his breathing has visibly improved. They feel that going to HD today will help him feel better. Reports he missed evening medications last night due to nausea. Discussed importance of taking medications especially Midodrine, Sildenafil, and Coumadin. She verbalized understanding. Discussed nausea with Dr Aundra Dubin. Order received for PRN Zofran. Discussed with pt's wife- prescription sent in to pt's preferred pharmacy.  Called and spoke with Shawn at [facility] regarding the above. Requested they try to pull more fluid as pt tolerates. They will plan to pull more fluid today based off patient's weight, and treatment  tolerance. Requested they page VAD coordinator for any issues during treatment. He verbalized understanding.   Emerson Monte RN [place] Coordinator  Office: [phone]  24/7 Pager: [phone]

## 2021-11-04 ENCOUNTER — Encounter (HOSPITAL_COMMUNITY): Payer: Self-pay | Admitting: Unknown Physician Specialty

## 2021-11-04 ENCOUNTER — Telehealth (HOSPITAL_COMMUNITY): Payer: Self-pay | Admitting: Unknown Physician Specialty

## 2021-11-04 MED ORDER — MOLNUPIRAVIR EUA 200MG CAPSULE: 4.0000 | ORAL_CAPSULE | Freq: Two times a day (BID) | ORAL | 0 refills | Status: AC

## 2021-11-04 NOTE — Telephone Encounter (Addendum)
Received call from pts wife stating that the pts "chest is rattling." Wife states that pts temp is 100.5, she states the pt is dizzy and just doesn't feel well. Wife tells me that the pts OT called to cancel this week because she has Covid. Mrs Biglow tells me that the OT was in the house at the end of last week. She states that Mr. Sagar has also been dealing with diarrhea for the past 3 days. I informed mrs Erby that the pt must take a in home Covid test today and call us with the results.  Pts wife called back to say that the pt tested positive for covid. Wife states that they will no longer allow any home agency or outside care into the home. I informed pt and wife that pt will need to perform daily exercises in order to not get deconditioned since he will no longer have home OT/PT. D/w with Dr. Aundra Dubin. Pt and wife were instructed: Call dialysis center to let them know pt is positive so they can give you specific instructions for tomorrows dialysis. Pick up [place] at the pharmacy and start medication immediately If pt becomes very SOB or starts to feel very poorly please call 911 and let VAD coord know by paging the [place] pager. Make sure to stay hydrated since pt is having diarrhea. Perform daily exercises and walk as tolerated to prevent further deconditioning.  [name] RN, BSN VAD Coordinator 24/7 Pager [phone]

## 2021-11-04 NOTE — Progress Notes (Signed)
Enrollment packet faxed to [facility] for home INR machine.  [name] RN, BSN VAD Coordinator 24/7 Pager [phone]

## 2021-11-05 ENCOUNTER — Telehealth (HOSPITAL_COMMUNITY): Payer: Self-pay

## 2021-11-05 ENCOUNTER — Other Ambulatory Visit (HOSPITAL_COMMUNITY): Payer: Self-pay

## 2021-11-05 NOTE — Telephone Encounter (Signed)
Transitions of Care Pharmacy   Call attempted for a pharmacy transitions of care follow-up.   Call attempt #1 11-05-21. Will follow-up in 2-3 days.

## 2021-11-06 ENCOUNTER — Other Ambulatory Visit (HOSPITAL_COMMUNITY): Payer: Self-pay

## 2021-11-06 ENCOUNTER — Telehealth (HOSPITAL_COMMUNITY): Payer: Self-pay | Admitting: Pharmacist

## 2021-11-06 NOTE — Telephone Encounter (Signed)
Pharmacy Transitions of Care Follow-up Telephone Call  Date of discharge: 10/24/21  Discharge Diagnosis: New LVAD and afib  Spoke with patient's wife who did not feel that a medication review was necessary.  Patient was sitting close by and also had no questions.  They reported that all medications had been reviewed at recent visit for INR check with Audry Riles PharmD.   Medication changes made at discharge:  - START:  CertaVite/Antioxidants  mirtazapine (REMERON)  pantoprazole (PROTONIX)  rosuvastatin (CRESTOR)  sildenafil (REVATIO)  warfarin (Coumadin)   - STOPPED:  apixaban 5 MG Tabs tablet (ELIQUIS)  furosemide 20 MG tablet (LASIX)  metoprolol tartrate 25 MG tablet (LOPRESSOR)  spironolactone 25 MG tablet (ALDACTONE)  tamsulosin 0.4 MG Caps capsule (FLOMAX)   - CHANGED:  amiodarone (PACERONE)  midodrine (PROAMATINE)     Medication Accessibility:  Home Pharmacy: [facility] [address], [place]   Was the patient provided with refills on discharged medications? Yes   Have all prescriptions been transferred from [facility] to home pharmacy? Yes   Is the patient able to afford medications? Has ins Notable copays: all generic Eligible patient assistance: n/a    Medication Review:  WARFARIN - Pt had INR checked on 2/9 and has next appointment on 2/23.  Pt denied needing further counseling.    Follow-up Appointments:  PCP Hospital f/u appt confirmed? None at this time   [facility] f/u appt confirmed?  Scheduled to see VAD clinic on 11/13/21.   If their condition worsens, is the pt aware to call PCP or go to the Emergency Dept.? yes  Final Patient Assessment: Spoke with Isaac [name], pts wife, while patient was beside her.  She denied needing any counseling and said that patient is doing well, having no issues, and is aware of upcoming appointments. Discussed transfers for several minutes as patient was unaware of the need for medications to be transferred and was  somewhat confused.

## 2021-11-13 ENCOUNTER — Ambulatory Visit (HOSPITAL_COMMUNITY)
Admission: RE | Admit: 2021-11-13 | Discharge: 2021-11-13 | Disposition: A | Discharge status: Home / Self Care | Payer: Medicare HMO | Source: Ambulatory Visit | Attending: Internal Medicine | Admitting: Internal Medicine

## 2021-11-13 ENCOUNTER — Other Ambulatory Visit: Payer: Self-pay

## 2021-11-13 ENCOUNTER — Ambulatory Visit (HOSPITAL_COMMUNITY): Payer: Self-pay | Admitting: Pharmacist

## 2021-11-13 VITALS — BP 90/0 | HR 70 | Ht 69.0 in | Wt 164.0 lb

## 2021-11-13 DIAGNOSIS — I071 Rheumatic tricuspid insufficiency: Secondary | ICD-10-CM | POA: Diagnosis not present

## 2021-11-13 DIAGNOSIS — Z8616 Personal history of COVID-19: Secondary | ICD-10-CM | POA: Diagnosis not present

## 2021-11-13 DIAGNOSIS — Z4801 Encounter for change or removal of surgical wound dressing: Secondary | ICD-10-CM | POA: Insufficient documentation

## 2021-11-13 DIAGNOSIS — Z955 Presence of coronary angioplasty implant and graft: Secondary | ICD-10-CM | POA: Insufficient documentation

## 2021-11-13 DIAGNOSIS — I2510 Atherosclerotic heart disease of native coronary artery without angina pectoris: Secondary | ICD-10-CM | POA: Insufficient documentation

## 2021-11-13 DIAGNOSIS — K4090 Unilateral inguinal hernia, without obstruction or gangrene, not specified as recurrent: Secondary | ICD-10-CM | POA: Diagnosis not present

## 2021-11-13 DIAGNOSIS — I132 Hypertensive heart and chronic kidney disease with heart failure and with stage 5 chronic kidney disease, or end stage renal disease: Secondary | ICD-10-CM | POA: Diagnosis present

## 2021-11-13 DIAGNOSIS — Z79899 Other long term (current) drug therapy: Secondary | ICD-10-CM | POA: Insufficient documentation

## 2021-11-13 DIAGNOSIS — Z7901 Long term (current) use of anticoagulants: Secondary | ICD-10-CM | POA: Diagnosis not present

## 2021-11-13 DIAGNOSIS — I5022 Chronic systolic (congestive) heart failure: Secondary | ICD-10-CM | POA: Diagnosis not present

## 2021-11-13 DIAGNOSIS — Z95811 Presence of heart assist device: Secondary | ICD-10-CM | POA: Insufficient documentation

## 2021-11-13 DIAGNOSIS — I428 Other cardiomyopathies: Secondary | ICD-10-CM | POA: Insufficient documentation

## 2021-11-13 DIAGNOSIS — Z992 Dependence on renal dialysis: Secondary | ICD-10-CM | POA: Insufficient documentation

## 2021-11-13 DIAGNOSIS — N179 Acute kidney failure, unspecified: Secondary | ICD-10-CM | POA: Diagnosis not present

## 2021-11-13 DIAGNOSIS — N186 End stage renal disease: Secondary | ICD-10-CM | POA: Diagnosis not present

## 2021-11-13 DIAGNOSIS — I480 Paroxysmal atrial fibrillation: Secondary | ICD-10-CM | POA: Insufficient documentation

## 2021-11-13 DIAGNOSIS — T50996A Underdosing of other drugs, medicaments and biological substances, initial encounter: Secondary | ICD-10-CM | POA: Diagnosis not present

## 2021-11-13 DIAGNOSIS — Z95810 Presence of automatic (implantable) cardiac defibrillator: Secondary | ICD-10-CM | POA: Diagnosis not present

## 2021-11-13 DIAGNOSIS — I3139 Other pericardial effusion (noninflammatory): Secondary | ICD-10-CM | POA: Insufficient documentation

## 2021-11-13 DIAGNOSIS — Z91128 Patient's intentional underdosing of medication regimen for other reason: Secondary | ICD-10-CM | POA: Diagnosis not present

## 2021-11-13 DIAGNOSIS — I9589 Other hypotension: Secondary | ICD-10-CM | POA: Insufficient documentation

## 2021-11-13 LAB — BASIC METABOLIC PANEL
Anion gap: 10 (ref 5–15)
BUN: 15 mg/dL (ref 8–23)
CO2: 28 mmol/L (ref 22–32)
Calcium: 8.6 mg/dL — ABNORMAL LOW (ref 8.9–10.3)
Chloride: 100 mmol/L (ref 98–111)
Creatinine, Ser: 2.24 mg/dL — ABNORMAL HIGH (ref 0.61–1.24)
GFR, Estimated: 31 mL/min — ABNORMAL LOW (ref >60)
Glucose, Bld: 83 mg/dL (ref 70–99)
Potassium: 3 mmol/L — ABNORMAL LOW (ref 3.5–5.1)
Sodium: 138 mmol/L (ref 135–145)

## 2021-11-13 LAB — CBC
HCT: 29.1 % — ABNORMAL LOW (ref 39.0–52.0)
Hemoglobin: 8.8 g/dL — ABNORMAL LOW (ref 13.0–17.0)
MCH: 28.2 pg (ref 26.0–34.0)
MCHC: 30.2 g/dL (ref 30.0–36.0)
MCV: 93.3 fL (ref 80.0–100.0)
Platelets: 293 10*3/uL (ref 150–400)
RBC: 3.12 MIL/uL — ABNORMAL LOW (ref 4.22–5.81)
RDW: 18.3 % — ABNORMAL HIGH (ref 11.5–15.5)
WBC: 4.8 10*3/uL (ref 4.0–10.5)
nRBC: 0 % (ref 0.0–0.2)

## 2021-11-13 LAB — PROTIME-INR
INR: 2.2 — ABNORMAL HIGH (ref 0.8–1.2)
Prothrombin Time: 24 seconds — ABNORMAL HIGH (ref 11.4–15.2)

## 2021-11-13 LAB — LACTATE DEHYDROGENASE: LDH: 197 U/L — ABNORMAL HIGH (ref 98–192)

## 2021-11-13 MED ORDER — AMIODARONE HCL 200 MG PO TABS: 100.0000 mg | ORAL_TABLET | Freq: Every day | ORAL | 6 refills | Status: DC

## 2021-11-13 NOTE — Progress Notes (Signed)
LVAD INR 

## 2021-11-13 NOTE — Patient Instructions (Addendum)
Decrease Amiodarone to 100 mg (half tablet) We will message the device clinic to get you enrolled in their remote monitoring Return to clinic in 1 month w/RAMP echo and TSH and CMET

## 2021-11-13 NOTE — Progress Notes (Addendum)
Patient presents for 2 week f/u in [facility] today with wife. Reports no problems with VAD equipment or concerns with drive line.  Patient arrived ambulatory with no assistance.   Patient reports that his dizziness has resolved.  Pt had COVID last week and was provided with molnupavir - pt tells me that he did not take this medication as he felt the worst of his symptoms were already over. He states he has recovered well and he is eating "really good."  Pt tells me has a  Biochemist, clinical at home for ICD interrogation. Pt has an appt with device clinic to set up remote monitoring on 3/14.  Pt tells me that dialysis is going well and that he feels the dialysis team is very "thorough." He states that he is taking the midodrine prior to dialysis and the dialysis team has only had to stop dialysis briefly a few times due to his BP. We have provided a doppler to the pt today to take to the dialysis team tomorrow. Pt denies any alarms during dialysis.  Pt states that he is urinating about twice a day but is not measuring the amount. He states that he would really like to not have to do dialysis.   Pt states that he feels he is getting stronger everyday. Pt is almost 8 weeks post surgery, DR. Aundra Dubin told him he may drive today. The pt is very excited about this.   Pts driveline is healing and the drainage is decreasing. See below for full note and pics.  Pt is c/o of inguinal hernia today. Pt states that it has gotten larger but denies any pain in this area. Dr Aundra Dubin informed the pt that he would like for him to be at least 6 months from VAD implant before having a hernia repair. Dr. Aundra Dubin informed pt that if the area starts to become painful or he is having other issues to let us know and we can refer him to [facility]. Pt is agreeable to this.   Pt was informed that Fatima Sanger from [facility] home INR monitoring has been attempting to contact him. Pt states that he doesn't answer numbers he  doesn't recognize. I have provided the pt with [place] number and I have also reached out to [place] to give the pt a call that he is expecting to hear from him. The pt is ready to begin home INR monitoring.  Pts K+ is 3.0. I will reach out to dialysis center to let them know per Dr. Aundra Dubin.  Vital Signs:  Doppler Pressure: 90 Automatc BP: 111/40 (69) HR:  70 SPO2: 98   Weight: 164 lb w/ eqt Last weight: 161.8 lbs Home weights: 151 - 153 lbs BMI today 23.8 today   VAD Indication: Destination Therapy due to age and kidney disease``    LVAD assessment:  HM III: Speed: 5200 rpms Flow: 4.2 Power: 3.7w    PI: 3.9 Alarms: none Events: 50-60 dialysis days; non-dailysis days 5-6  Fixed speed: 5200 Low speed limit: 4900  Primary controller: back up battery due for replacement in 31 months Secondary controller:  back up battery due for replacement in 31 months   I reviewed the LVAD parameters from today and compared the results to the patient's prior recorded data. LVAD interrogation was NEGATIVE for significant power changes, NEGATIVE for clinical alarms and STABLE for PI events/speed drops. No programming changes were made and pump is functioning within specified parameters. Pt is performing daily controller and  system monitor self tests along with completing weekly and monthly maintenance for LVAD equipment.   LVAD equipment check completed and is in good working order. Back-up equipment present. Charged back up battery and performed self-test on equipment.    Annual Equipment Maintenance on UBC/PM was performed on 10/2021.   Exit Site Care: VAD dressing and anchor removed and site care performed using sterile technique. Drive line exit site cleaned with Chlora prep applicators x 2, rinsed with saline, and allowed to dry before gauze dressing and Aquacel silver strip applied. Exit site unincorporated, the velour is fully implanted at exit site. Small amount of serous drainage with no  redness, tenderness, or foul odor noted. Drive line anchor re-applied. Pt denies fever or chills. Provided patient with 14 daily kits and 11 anchors.        Device:  [employer] dual  Therapies: on Pacing: AV paced 60 >> increased to 70  Last check: Latitude today per Joey Deakins   BP & Labs:  MAP 90  - Doppler is reflecting MAP   Hgb 8.8 - No S/S of bleeding. Specifically denies melena/BRBPR or nosebleeds.   LDH stable at 197 with established baseline of 200 - 300. Denies tea-colored urine. No power elevations noted on interrogation.   Patient Instructions:  Decrease Amiodarone to 100 mg (half tablet) Return to clinic in 1 month w/RAMP echo and TSH and CMET per DM  [name], RN VAD Coordinator    Office: [phone] 24/7 Emergency VAD Pager: [phone]  Cardiology: Dr. Aundra Dubin PCP: [name], MD  Follow up for Heart Failure/LVAD: 71 y.o. with history of CAD, HTN, atrial fibrillation, and chronic systolic CHF returns for followup of CHF/LVAD.   Patient has had a long-standing cardiomyopathy, EF had been in the 25% range for years.  He had PCI to OM1 in 2007 and RCA in 2013.  From his report, these episodes do not sound like ACS events.  Echo in 12/22 showed EF < 20%, severe LV dilation, restrictive diastolic function, moderate RV dysfunction, moderate MR, mod-severe TR. HF was been complicated by CKD stage 3. He was also noted on device interrogation to have been in atrial fibrillation persistently since 10/22.  He was admitted at [facility] in 12/22.  BP was low, his BP-active meds were stopped and midodrine was begun.  He was diuresed and discharged home.    He reports ongoing severe dyspnea and was admitted again in 12/22 after he stood up then passed out at home.  Device interrogation showed run of VT treated x 2 with ATP possibly around the time of syncope.  RHC in 12/22 showed low output HF and he developed cardiogenic shock. Impella 5.5 was placed.  On 09/17/21, HM3  LVAD was placed, ICD leads were plastered to the tricuspid valve with severe TR, the valve was not replaced.  The patient had significant interoperative hypotension.  Post-operatively, he developed progressive renal failure and was started on CVVH then iHD.  He had a bumpy post-op course with renal failure and RV failure, but was ultimately able to discharge home on 10/24/21.   He is getting stronger.  Still has trouble climbing stairs, his legs feel week.  He is not short of breath walking on flat ground.  He walked into the office today.  No lightheadedness.  He has been tolerating HD well.  Weight is stable.  He wants to start driving.   Denies LVAD alarms.  Denies driveline trauma, erythema or drainage.  Denies ICD shocks.  Reports taking Coumadin as prescribed and adherence to anticoagulation based dietary restrictions.  Denies bright red blood per rectum or melena, no dark urine or hematuria.    PMH: 1. HTN 2. Hyperlipidemia 3. CAD: H/o PCI to [place] 2007 and PCI to RCA in 2013.  Uncertain of circumstances but do not sound like ACS events.  4. ESRD 5. Atrial fibrillation: Paroxysmal 6. Chronic systolic CHF: Long-standing cardiomyopathy with EF in the 25% range.  Cause uncertain, has history of PCI to RCA and OM1, but degree of CAD does not seem to explain severity of cardiomyopathy. Father had history of CAD/CHF.  Not heavy drinker. Has [employer] ICD.  - Echo (12/22): EF < 20%, severe LV dilation, restrictive diastolic function, moderate RV dysfunction, moderate MR, mod-severe TR.  - 12/22 cardiogenic shock with Impella 5.5 placement then Heartmate 3 LVAD.  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  7. VT 8. RV failure/severe TR   Current Outpatient Medications  Medication Sig Dispense Refill   albuterol (VENTOLIN HFA) 108 (90 Base) MCG/ACT inhaler Inhale 2 puffs into the lungs every 6 (six) hours as needed.     amiodarone (PACERONE) 200 MG tablet  Take 0.5 tablets (100 mg total) by mouth daily. 30 tablet 6   midodrine (PROAMATINE) 10 MG tablet Take Monday, Wednesday, and Friday 30 min before Hemo-Dialysis session 40 tablet 6   mirtazapine (REMERON) 7.5 MG tablet Take 1 tablet (7.5 mg total) by mouth at bedtime. 30 tablet 6   Multiple Vitamins-Minerals (CERTAVITE/ANTIOXIDANTS) TABS Take 1 tablet by mouth at bedtime. 30 tablet 0   ondansetron (ZOFRAN) 4 MG tablet Take 1 tablet (4 mg total) by mouth every 8 (eight) hours as needed for nausea or vomiting. 40 tablet 3   pantoprazole (PROTONIX) 40 MG tablet Take 1 tablet (40 mg total) by mouth daily. 30 tablet 6   rosuvastatin (CRESTOR) 10 MG tablet Take 1 tablet (10 mg total) by mouth daily. 30 tablet 6   sildenafil (REVATIO) 20 MG tablet Take 1 tablet (20 mg total) by mouth 3 (three) times daily. 90 tablet 6   warfarin (COUMADIN) 2 MG tablet Take 1 tablet (2 mg total) by mouth daily at 4 PM. 30 tablet 11   No current facility-administered medications for this encounter.    Mushroom extract complex and Neosporin [neomycin-bacitracin zn-polymyx]  REVIEW OF SYSTEMS: All systems negative except as listed in HPI, PMH and Problem list.   LVAD INTERROGATION:  See LVAD nurse's note above.   I reviewed the LVAD parameters from today, and compared the results to the patient's prior recorded data.  No programming changes were made.  The LVAD is functioning within specified parameters.  The patient performs LVAD self-test daily.  LVAD interrogation was negative for any significant power changes, alarms or PI events/speed drops.  LVAD equipment check completed and is in good working order.  Back-up equipment present.   LVAD education done on emergency procedures and precautions and reviewed exit site care.    Vitals:   11/13/21 1349 11/13/21 1350  BP: (!) 111/40 (!) 90/0  Pulse: 70   Weight: 74.4 kg (164 lb)   Height: 5\' 9"  (1.753 m)   MAP 90  Physical Exam: General: Well appearing this am.  NAD.  HEENT: Normal. Neck: Supple, JVP 8-9 cm cm. Carotids OK.  Cardiac:  Mechanical heart sounds with LVAD hum present.  Lungs:  CTAB, normal effort.  Abdomen:  NT, ND, no HSM. No bruits  or masses. +BS  LVAD exit site: Well-healed and incorporated. Dressing dry and intact. No erythema or drainage. Stabilization device present and accurately applied. Driveline dressing changed daily per sterile technique. Extremities:  Warm and dry. No cyanosis, clubbing, rash, or edema.  Neuro:  Alert & oriented x 3. Cranial nerves grossly intact. Moves all 4 extremities w/o difficulty. Affect pleasant    ASSESSMENT AND PLAN: 1. Chronic systolic CHF:  Long-standing cardiomyopathy.  [place].  Echo in 12/22 with EF < 20%, severe LV dilation, restrictive diastolic function, moderate RV dysfunction, moderate MR, mod-severe TR. Cause of cardiomyopathy is uncertain.  He has a history of CAD, but I do not think that the described CAD from the past could explain his cardiomyopathy, but CAD could have progressed.  He was admitted in 12/22 with cardiogenic shock and Impella 5.5. s/p HM III VAD on 09/17/22.  Post-op course complicated by AKI and RV failure. Now on HD for volume management, mildly volume overloaded on exam today with stable weight.  NYHA class II, getting more active and eating better.  MAP good today, no orthostatic symptoms.  - Continue midodrine 10 mg MWF pre-HD.  - Continue  sildenafil 20 tid for RV.   - Warfarin for INR 2-2.5.   - He is now off ASA.  - Stable LVAD parameters, has PI events generally on HD days.  - Ramp echo next appt.  2. Tricuspid regurgitation:  Tricuspid repair not done at time of VAD due to proximity of ICD wires and hypotension during surgery.  He has severe TR.  3. Atrial fibrillation: Regular rhythm. - Decrease amiodarone to 100 mg daily, check LFTs and TSH.  He will need a regular eye exam. 4. ESRD: Suspect intra-op hypotension at the time of LVAD placement led  to development of ATN => urine sediment looked like ATN per renal. Had initial CVVH and transitioned over to HD.  No making much urine.  - Continue iHD.  - Continue midodrine on the mornings of HD.    5. CAD: History of PCI to OM1 in 2007 and RCA in 2013.  No CP or ACs.  - Deferred cardiac cath in 12/22 due to AKI and plan for VAD - Continue Crestor.  6. Pericardial effusion: Will follow the loculated lateral wall effusion by echo, will echo next appointment.    He can start driving.   Loralie Champagne 11/13/2021

## 2021-11-17 ENCOUNTER — Telehealth (HOSPITAL_COMMUNITY): Payer: Self-pay | Admitting: *Deleted

## 2021-11-17 MED ORDER — PREDNISONE 10 MG PO TABS: ORAL_TABLET | ORAL | 0 refills | Status: DC

## 2021-11-17 NOTE — Telephone Encounter (Signed)
Wife called to report patient experiencing gout pain in feet. Dr. Aundra Dubin updated on same.   Rx sent to local pharmacy, instructed patient to take prednisone 40 mg x 3 days and Prednisone 20 mg x 3 days then stop. Asked them to call if symptoms worsen or do not improve. Both verbalized understanding of same.  Zada Girt RN, [place] Coordinator [phone]

## 2021-11-19 ENCOUNTER — Other Ambulatory Visit (HOSPITAL_BASED_OUTPATIENT_CLINIC_OR_DEPARTMENT_OTHER): Payer: Self-pay

## 2021-11-19 ENCOUNTER — Ambulatory Visit (HOSPITAL_COMMUNITY): Payer: Self-pay | Admitting: Pharmacist

## 2021-11-19 LAB — POCT INR: INR: 2.2 (ref 2.0–3.0)

## 2021-11-19 NOTE — Progress Notes (Signed)
LVAD INR 

## 2021-11-21 ENCOUNTER — Other Ambulatory Visit (HOSPITAL_COMMUNITY): Payer: Self-pay

## 2021-11-21 ENCOUNTER — Telehealth (HOSPITAL_COMMUNITY): Payer: Self-pay | Admitting: *Deleted

## 2021-11-21 ENCOUNTER — Other Ambulatory Visit (HOSPITAL_COMMUNITY): Payer: Self-pay | Admitting: *Deleted

## 2021-11-21 DIAGNOSIS — I5022 Chronic systolic (congestive) heart failure: Secondary | ICD-10-CM

## 2021-11-21 DIAGNOSIS — Z95811 Presence of heart assist device: Secondary | ICD-10-CM

## 2021-11-21 MED ORDER — SILDENAFIL CITRATE 20 MG PO TABS: 20.0000 mg | ORAL_TABLET | Freq: Three times a day (TID) | ORAL | 6 refills | Status: DC

## 2021-11-21 NOTE — Telephone Encounter (Signed)
Patient's insurance will not cover Sildenafil. Will need to use GoodRx.  ?  ?Medication will be the least expensive to pick up at the [facility] on [address] in [place]. Updated prescription with the below GoodRx card information sent in today. ?  ?Mrs Kazlauskas aware of the above. I texted and emailed a copy of the GoodRx card to her and made her aware that she needs to bring a copy of her card with her to pick up medication. She verbalized understanding.  ?  ?  ? ?Mrs Port verbalized concern that she has not been able to get a return call when she pages. Reports she paged yesterday evening. This VAD coordinator did not receive any pages last night. Had her test page pager this morning. First page came through "[phone]" and second page "[phone]" came through. She reports she hung up after starting to type number the first time this morning. Confirmed that pager is receiving pages.  ?  ?Emerson Monte RN ?VAD Coordinator  ?Office: [phone]  ?24/7 Pager: [phone]  ?

## 2021-11-21 NOTE — Telephone Encounter (Signed)
Patient's insurance will not cover Sildenafil. Will need to use GoodRx.  ? ?Medication will be the least expensive to pick up at the [facility] on [address] in [place]. Updated prescription with the below GoodRx card information sent in today. ? ?Mrs Seymore aware of the above. I texted and emailed a copy of the GoodRx card to her and made her aware that she needs to bring a copy of her card with her to pick up medication. She verbalized understanding.  ? ? ? ?Mrs Sprigg verbalized concern that she has not been able to get a return call when she pages. Reports she paged yesterday evening. This VAD coordinator did not receive any pages last night. Had her test page pager this morning. First page came through "[phone]" and second page "[phone]" came through. She reports she hung up after starting to type number the first time this morning. Confirmed that pager is receiving pages.  ? ?Emerson Monte RN ?VAD Coordinator  ?Office: [phone]  ?24/7 Pager: [phone]  ? ?

## 2021-11-24 ENCOUNTER — Telehealth: Payer: Self-pay | Admitting: Unknown Physician Specialty

## 2021-11-24 ENCOUNTER — Telehealth (HOSPITAL_COMMUNITY): Payer: Self-pay | Admitting: Unknown Physician Specialty

## 2021-11-24 DIAGNOSIS — Z95811 Presence of heart assist device: Secondary | ICD-10-CM

## 2021-11-24 DIAGNOSIS — I5022 Chronic systolic (congestive) heart failure: Secondary | ICD-10-CM

## 2021-11-24 MED ORDER — SILDENAFIL CITRATE 20 MG PO TABS: 20.0000 mg | ORAL_TABLET | Freq: Three times a day (TID) | ORAL | 6 refills | Status: DC

## 2021-11-24 MED ORDER — CERTAVITE/ANTIOXIDANTS PO TABS: 1.0000 | ORAL_TABLET | Freq: Every day | ORAL | 3 refills | Status: DC

## 2021-11-24 NOTE — Telephone Encounter (Signed)
Resent prescription to [facility] printed last week. ? ?[name] RN, BSN ?VAD Coordinator ?24/7 Pager [phone] ? ?

## 2021-11-24 NOTE — Telephone Encounter (Signed)
error 

## 2021-11-24 NOTE — Addendum Note (Signed)
Addended by: [name] B on: 11/24/2021 11:07 AM ? ? Modules accepted: Orders ? ?

## 2021-11-27 ENCOUNTER — Ambulatory Visit (HOSPITAL_COMMUNITY): Payer: Self-pay | Admitting: Pharmacist

## 2021-11-27 LAB — POCT INR: INR: 1.5 — AB (ref 2.0–3.0)

## 2021-11-27 MED ORDER — WARFARIN SODIUM 2 MG PO TABS: ORAL_TABLET | ORAL | 11 refills | Status: DC

## 2021-11-27 NOTE — Progress Notes (Signed)
LVAD INR 

## 2021-12-02 ENCOUNTER — Encounter: Payer: Self-pay | Admitting: Internal Medicine

## 2021-12-02 ENCOUNTER — Other Ambulatory Visit (HOSPITAL_COMMUNITY): Payer: Self-pay | Admitting: *Deleted

## 2021-12-02 ENCOUNTER — Ambulatory Visit (INDEPENDENT_AMBULATORY_CARE_PROVIDER_SITE_OTHER): Payer: Medicare HMO | Admitting: Internal Medicine

## 2021-12-02 ENCOUNTER — Other Ambulatory Visit: Payer: Self-pay

## 2021-12-02 VITALS — BP 94/0 | HR 70 | Ht 69.0 in | Wt 164.8 lb

## 2021-12-02 DIAGNOSIS — Z95810 Presence of automatic (implantable) cardiac defibrillator: Secondary | ICD-10-CM

## 2021-12-02 DIAGNOSIS — I429 Cardiomyopathy, unspecified: Secondary | ICD-10-CM | POA: Diagnosis not present

## 2021-12-02 DIAGNOSIS — Z95811 Presence of heart assist device: Secondary | ICD-10-CM

## 2021-12-02 DIAGNOSIS — I5022 Chronic systolic (congestive) heart failure: Secondary | ICD-10-CM

## 2021-12-02 DIAGNOSIS — I4891 Unspecified atrial fibrillation: Secondary | ICD-10-CM | POA: Diagnosis not present

## 2021-12-02 LAB — CUP PACEART INCLINIC DEVICE CHECK
Brady Statistic RA Percent Paced: 75 %
Brady Statistic RV Percent Paced: 71 %
Date Time Interrogation Session: 20230314162617
HighPow Impedance: 39 Ohm
Implantable Lead Implant Date: 20140205
Implantable Lead Implant Date: 20140205
Implantable Lead Location: 753859
Implantable Lead Location: 753860
Implantable Lead Model: 296
Implantable Lead Model: 4470
Implantable Lead Serial Number: 727876
Implantable Pulse Generator Implant Date: 20140205
Lead Channel Impedance Value: 371 Ohm
Lead Channel Impedance Value: 434 Ohm
Lead Channel Pacing Threshold Amplitude: 0.6 V
Lead Channel Pacing Threshold Amplitude: 1 V
Lead Channel Pacing Threshold Pulse Width: 0.5 ms
Lead Channel Pacing Threshold Pulse Width: 0.5 ms
Lead Channel Setting Pacing Amplitude: 2.5 V
Lead Channel Setting Pacing Amplitude: 2.5 V
Lead Channel Setting Pacing Pulse Width: 0.5 ms
Lead Channel Setting Sensing Sensitivity: 0.6 mV
Pulse Gen Serial Number: 113490

## 2021-12-02 MED ORDER — MIDODRINE HCL 10 MG PO TABS: ORAL_TABLET | ORAL | 6 refills | Status: DC

## 2021-12-02 MED ORDER — CERTAVITE/ANTIOXIDANTS PO TABS: 1.0000 | ORAL_TABLET | Freq: Every day | ORAL | 3 refills | Status: DC

## 2021-12-02 NOTE — Progress Notes (Signed)
? ? ? ? ?ELECTROPHYSIOLOGY CONSULT NOTE  ?Patient ID: Isaac [name], MRN: [id number], DOB/AGE: 70-Nov-1952 70 y.o. ?Admit date: (Not on file) ?Date of Consult: 12/02/2021 ? ?Primary Physician: [name], MD ?Primary Cardiologist: DB  ?  ?  ?Isaac [name] is a 71 y.o. male who is being seen today for the evaluation of ICD  at the request of Dr DM .  ? ? ?HPI ?[name] is a 71 y.o. male seen to establish care for [place] ICD implanted 2014 ? ?Long standing history of nonischemic cardiomyopathy with concomitant coronary disease with PPI  OM.  EF has been in the 25% range for years apparently. ? ?Admitted [facility] 12/22 with hypotension.  Midodrine was initiated.  Readmitted later 12/22 with ventricular tachycardia associated with syncope.  He was found to be in shock and Impella was placed and subsequently an LVAD.  Severe TR was noted. ? ?ESRD on HD  ? ?Now with modest dyspnea, no chest pain some lightheadedness ? ? ? ?DATE TEST EF   ?12/22 Echo   <20 %   ?1/23 Echo  25% TR severe  ? ?Date Cr K Hgb  ?2/23 2.21 3.0 8.8  ?  ? ? ? ?Past Medical History:  ?Diagnosis Date  ? Arrhythmia   ? atrial fibrillation  ? CHF (congestive heart failure) ([place])   ? Chronic kidney disease   ? Coronary artery disease   ? Hyperlipidemia   ? Hypertension   ? Myocardial infarct [facility])   ?   ? ?Surgical History:  ?Past Surgical History:  ?Procedure Laterality Date  ? CARDIAC DEFIBRILLATOR PLACEMENT  feb 2014  ? INSERTION OF IMPLANTABLE LEFT VENTRICULAR ASSIST DEVICE N/A 09/17/2021  ? Procedure: INSERTION OF IMPLANTABLE LEFT VENTRICULAR ASSIST DEVICE AND INSERTION OF FEMORAL ARTERIAL LINE;  Surgeon: Gaye Pollack, MD;  Location: [facility];  Service: Open Heart Surgery;  Laterality: N/A;  ? IR FLUORO GUIDE CV LINE RIGHT  09/27/2021  ? IR THORACENTESIS ASP PLEURAL SPACE W/IMG GUIDE  09/27/2021  ? IR US GUIDE VASC ACCESS RIGHT  09/27/2021  ? PLACEMENT OF IMPELLA LEFT VENTRICULAR ASSIST DEVICE N/A 09/12/2021  ? Procedure:  PLACEMENT OF IMPELLA 5.5 LEFT VENTRICULAR ASSIST DEVICE;  Surgeon: Melrose Nakayama, MD;  Location: [facility];  Service: Open Heart Surgery;  Laterality: N/A;  ? RIGHT HEART CATH N/A 09/08/2021  ? Procedure: RIGHT HEART CATH;  Surgeon: Larey Dresser, MD;  Location: [place] CV LAB;  Service: Cardiovascular;  Laterality: N/A;  ? TEE WITHOUT CARDIOVERSION N/A 09/12/2021  ? Procedure: TRANSESOPHAGEAL ECHOCARDIOGRAM (TEE);  Surgeon: Melrose Nakayama, MD;  Location: [facility];  Service: Open Heart Surgery;  Laterality: N/A;  ? TEE WITHOUT CARDIOVERSION N/A 09/17/2021  ? Procedure: TRANSESOPHAGEAL ECHOCARDIOGRAM (TEE);  Surgeon: Gaye Pollack, MD;  Location: [place];  Service: Open Heart Surgery;  Laterality: N/A;  ?  ? ?Home Meds: ?Current Meds  ?Medication Sig  ? albuterol (VENTOLIN HFA) 108 (90 Base) MCG/ACT inhaler Inhale 2 puffs into the lungs every 6 (six) hours as needed.  ? amiodarone (PACERONE) 200 MG tablet Take 0.5 tablets (100 mg total) by mouth daily.  ? midodrine (PROAMATINE) 10 MG tablet Take Monday, Wednesday, and Friday 30 min before Hemo-Dialysis session  ? mirtazapine (REMERON) 7.5 MG tablet Take 1 tablet (7.5 mg total) by mouth at bedtime.  ? Multiple Vitamins-Minerals (CERTAVITE/ANTIOXIDANTS) TABS Take 1 tablet by mouth at bedtime.  ? ondansetron (ZOFRAN) 4 MG tablet Take 1 tablet (4 mg  total) by mouth every 8 (eight) hours as needed for nausea or vomiting.  ? pantoprazole (PROTONIX) 40 MG tablet Take 1 tablet (40 mg total) by mouth daily.  ? predniSONE (DELTASONE) 10 MG tablet Take 40 mg x 3 days then 20 mg x three days then stop  ? rosuvastatin (CRESTOR) 10 MG tablet Take 1 tablet (10 mg total) by mouth daily.  ? sildenafil (REVATIO) 20 MG tablet Take 1 tablet (20 mg total) by mouth 3 (three) times daily.  ? warfarin (COUMADIN) 2 MG tablet Take 4 mg (2 tabs) every Monday and 2 mg (1 tab) all other days or as directed by the [facility].  ? ? ?Allergies:  ?Allergies  ?Allergen  Reactions  ? Mushroom Extract Complex Nausea And Vomiting  ? Neosporin [Neomycin-Bacitracin Zn-Polymyx] Hives  ? ? ?Social History  ? ?Socioeconomic History  ? Marital status: Married  ?  Spouse name: Not on file  ? Number of children: Not on file  ? Years of education: Not on file  ? Highest education level: Not on file  ?Occupational History  ? Not on file  ?Tobacco Use  ? Smoking status: Former  ? Smokeless tobacco: Never  ?Substance and Sexual Activity  ? Alcohol use: No  ? Drug use: No  ? Sexual activity: Not on file  ?Other Topics Concern  ? Not on file  ?Social History Narrative  ? Not on file  ? ?Social Determinants of Health  ? ?Financial Resource Strain: Not on file  ?Food Insecurity: Not on file  ?Transportation Needs: Not on file  ?Physical Activity: Not on file  ?Stress: Not on file  ?Social Connections: Not on file  ?Intimate Partner Violence: Not on file  ?  ? ?History reviewed. No pertinent family history.  ? ?ROS:  Please see the history of present illness.     All other systems reviewed and negative.  ? ? ?Physical Exam:  ?Blood pressure (!) 94/0, pulse 70, height 5\' 9"  (1.753 m), weight 164 lb 12.8 oz (74.8 kg), SpO2 99 %. ?General: Well developed, well nourished male in no acute distress. ?Head: Normocephalic, atraumatic, sclera non-icteric, no xanthomas, nares are without discharge. ?EENT: normal  ?Lymph Nodes:  none ?Neck: Negative for carotid bruits. JVD not elevated. ?Back:without scoliosis kyphosis  ?Lungs: Clear bilaterally to auscultation without wheezes, rales, or rhonchi. Breathing is unlabored. ?Heart: RRR with S1 S2  no murmur .LVAD hum No rubs, or gallops appreciated. ?Abdomen: Soft, non-tender, non-distended with normoactive bowel sounds. No hepatomegaly. No rebound/guarding. No obvious abdominal masses. ?Msk:  Strength and tone appear normal for age. ?Extremities: No clubbing or cyanosis. No edema.  Distal pedal pulses are 2+ and equal bilaterally. ?Skin: Warm and Dry ?Neuro: Alert  and oriented X 3. CN III-XII intact Grossly normal sensory and motor function . ?Psych:  Responds to questions appropriately with a normal affect. ?  ?  ?  ? ?EKG:   ? ? ?Assessment and Plan:  ?NICM  ? ?CAD with prior Stent ? ?LVAD ? ?ICD [employer]  ? ?VT with ATP-- ? ?Discussed  ? ? ? ? ?Virl [place]  ?

## 2021-12-02 NOTE — Telephone Encounter (Signed)
Received call from pt's wife reporting that [facility] told her they never received refill request for Certavite that was sent last week. [facility]av[facility] on [address] per pt's wife's request today.  ? ?Emerson Monte RN ?VAD Coordinator  ?Office: [phone]  ?24/7 Pager: [phone]  ? ?

## 2021-12-02 NOTE — Addendum Note (Signed)
Addended by: Emerson Monte B on: 12/02/2021 03:01 PM ? ? Modules accepted: Orders ? ?

## 2021-12-02 NOTE — Patient Instructions (Signed)
Medication Instructions:  ?Your physician recommends that you continue on your current medications as directed. Please refer to the Current Medication list given to you today. ? ?*If you need a refill on your cardiac medications before your next appointment, please call your pharmacy* ? ? ?Lab Work: ?None ordered. ? ?If you have labs (blood work) drawn today and your tests are completely normal, you will receive your results only by: ?MyChart Message (if you have MyChart) OR ?A paper copy in the mail ?If you have any lab test that is abnormal or we need to change your treatment, we will call you to review the results. ? ? ?Testing/Procedures: ?None ordered. ? ? ? ?Follow-Up: ?At [facility], you and your health needs are our priority.  As part of our continuing mission to provide you with exceptional heart care, we have created designated Provider Care Teams.  These Care Teams include your primary Cardiologist (physician) and Advanced Practice Providers (APPs -  Physician Assistants and Nurse Practitioners) who all work together to provide you with the care you need, when you need it. ? ?We recommend signing up for the patient portal called "MyChart".  Sign up information is provided on this After Visit Summary.  MyChart is used to connect with patients for Virtual Visits (Telemedicine).  Patients are able to view lab/test results, encounter notes, upcoming appointments, etc.  Non-urgent messages can be sent to your provider as well.   ?To learn more about what you can do with MyChart, go to [email or web address].   ? ?Your next appointment:   ?9 months with Dr Caryl Comes :1}  ? ? ?Other Instructions: ? ?Please set up your home monitor provided to you for your LVAD. ?

## 2021-12-04 ENCOUNTER — Telehealth (HOSPITAL_COMMUNITY): Payer: Self-pay | Admitting: *Deleted

## 2021-12-04 ENCOUNTER — Other Ambulatory Visit (HOSPITAL_COMMUNITY): Payer: Self-pay | Admitting: *Deleted

## 2021-12-04 ENCOUNTER — Ambulatory Visit (HOSPITAL_COMMUNITY): Payer: Self-pay | Admitting: Pharmacist

## 2021-12-04 ENCOUNTER — Other Ambulatory Visit: Payer: Self-pay | Admitting: *Deleted

## 2021-12-04 LAB — POCT INR: INR: 1.8 — AB (ref 2.0–3.0)

## 2021-12-04 MED ORDER — MIDODRINE HCL 10 MG PO TABS: 15.0000 mg | ORAL_TABLET | ORAL | 2 refills | Status: DC

## 2021-12-04 NOTE — Progress Notes (Signed)
LVAD INR 

## 2021-12-04 NOTE — Progress Notes (Signed)
Received page from patient's wife reporting chest pain, cramping, nausea, and feeling poorly after HD treatment again yesterday. Also reports BP dropped during treatment.  Discussed with Dr Aundra Dubin. Order received to increase Midodrine to 15 mg MWF 30 minutes prior to HD session, and to discuss increasing dry weight / decreasing UF during treatment with [facility].  ? ?I called and spoke with Lanny Cramp at [facility] about the above concerns. Reports 3.4 L pulled during treatment yesterday, and pt ended almost 1 kg less than dry weight of 67 kg after treatment. She will discuss increasing dry weight / decreasing UF (if needed) with provider, and let us know new dry weight. Made her aware of increased Midodrine dosing.  ? ?Called and updated Mrs Mckenzie of the above plan. Per her request updated Midodrine prescription sent to [facility] on [address] [facility]. Advised to notify VAD coordinators if patient does not feel well after treatment tomorrow. She verbalized understanding of all instructions.  ? ?Emerson Monte RN ?VAD Coordinator  ?Office: [phone]  ?24/7 Pager: [phone]  ? ?

## 2021-12-04 NOTE — Telephone Encounter (Signed)
Received call from Stanley at [facility] HD. Pt's new dry weight will be 69 kg (up from 68.5 kg).  ? ?Emerson Monte RN ?VAD Coordinator  ?Office: [phone]  ?24/7 Pager: [phone]  ? ?

## 2021-12-08 ENCOUNTER — Other Ambulatory Visit (HOSPITAL_COMMUNITY): Payer: Self-pay | Admitting: *Deleted

## 2021-12-08 DIAGNOSIS — Z95811 Presence of heart assist device: Secondary | ICD-10-CM

## 2021-12-08 DIAGNOSIS — I5022 Chronic systolic (congestive) heart failure: Secondary | ICD-10-CM

## 2021-12-08 DIAGNOSIS — Z7901 Long term (current) use of anticoagulants: Secondary | ICD-10-CM

## 2021-12-09 ENCOUNTER — Other Ambulatory Visit: Payer: Self-pay

## 2021-12-09 ENCOUNTER — Ambulatory Visit (HOSPITAL_COMMUNITY)
Admission: RE | Admit: 2021-12-09 | Discharge: 2021-12-09 | Disposition: A | Discharge status: Home / Self Care | Payer: Medicare HMO | Source: Ambulatory Visit | Attending: Cardiology | Admitting: Cardiology

## 2021-12-09 ENCOUNTER — Ambulatory Visit (HOSPITAL_BASED_OUTPATIENT_CLINIC_OR_DEPARTMENT_OTHER)
Admission: RE | Admit: 2021-12-09 | Discharge: 2021-12-09 | Disposition: A | Discharge status: Home / Self Care | Payer: Medicare HMO | Source: Ambulatory Visit | Attending: Cardiology | Admitting: Cardiology

## 2021-12-09 ENCOUNTER — Ambulatory Visit (HOSPITAL_COMMUNITY): Payer: Self-pay | Admitting: Pharmacist

## 2021-12-09 DIAGNOSIS — I9589 Other hypotension: Secondary | ICD-10-CM | POA: Diagnosis not present

## 2021-12-09 DIAGNOSIS — I340 Nonrheumatic mitral (valve) insufficiency: Secondary | ICD-10-CM

## 2021-12-09 DIAGNOSIS — Z95811 Presence of heart assist device: Secondary | ICD-10-CM

## 2021-12-09 DIAGNOSIS — I480 Paroxysmal atrial fibrillation: Secondary | ICD-10-CM | POA: Diagnosis not present

## 2021-12-09 DIAGNOSIS — I3139 Other pericardial effusion (noninflammatory): Secondary | ICD-10-CM | POA: Diagnosis not present

## 2021-12-09 DIAGNOSIS — Z955 Presence of coronary angioplasty implant and graft: Secondary | ICD-10-CM | POA: Diagnosis not present

## 2021-12-09 DIAGNOSIS — I2510 Atherosclerotic heart disease of native coronary artery without angina pectoris: Secondary | ICD-10-CM | POA: Insufficient documentation

## 2021-12-09 DIAGNOSIS — I082 Rheumatic disorders of both aortic and tricuspid valves: Secondary | ICD-10-CM | POA: Diagnosis not present

## 2021-12-09 DIAGNOSIS — I5022 Chronic systolic (congestive) heart failure: Secondary | ICD-10-CM | POA: Diagnosis not present

## 2021-12-09 DIAGNOSIS — Z7901 Long term (current) use of anticoagulants: Secondary | ICD-10-CM | POA: Diagnosis not present

## 2021-12-09 DIAGNOSIS — N179 Acute kidney failure, unspecified: Secondary | ICD-10-CM | POA: Diagnosis not present

## 2021-12-09 DIAGNOSIS — I361 Nonrheumatic tricuspid (valve) insufficiency: Secondary | ICD-10-CM | POA: Diagnosis not present

## 2021-12-09 DIAGNOSIS — Z79899 Other long term (current) drug therapy: Secondary | ICD-10-CM | POA: Diagnosis not present

## 2021-12-09 DIAGNOSIS — N186 End stage renal disease: Secondary | ICD-10-CM | POA: Diagnosis not present

## 2021-12-09 DIAGNOSIS — I132 Hypertensive heart and chronic kidney disease with heart failure and with stage 5 chronic kidney disease, or end stage renal disease: Secondary | ICD-10-CM | POA: Diagnosis not present

## 2021-12-09 LAB — COMPREHENSIVE METABOLIC PANEL
ALT: 18 U/L (ref 0–44)
AST: 22 U/L (ref 15–41)
Albumin: 3.2 g/dL — ABNORMAL LOW (ref 3.5–5.0)
Alkaline Phosphatase: 115 U/L (ref 38–126)
Anion gap: 12 (ref 5–15)
BUN: 16 mg/dL (ref 8–23)
CO2: 29 mmol/L (ref 22–32)
Calcium: 9 mg/dL (ref 8.9–10.3)
Chloride: 101 mmol/L (ref 98–111)
Creatinine, Ser: 2.18 mg/dL — ABNORMAL HIGH (ref 0.61–1.24)
GFR, Estimated: 32 mL/min — ABNORMAL LOW (ref >60)
Glucose, Bld: 68 mg/dL — ABNORMAL LOW (ref 70–99)
Potassium: 3.3 mmol/L — ABNORMAL LOW (ref 3.5–5.1)
Sodium: 142 mmol/L (ref 135–145)
Total Bilirubin: 0.9 mg/dL (ref 0.3–1.2)
Total Protein: 6.9 g/dL (ref 6.5–8.1)

## 2021-12-09 LAB — CBC
HCT: 31.2 % — ABNORMAL LOW (ref 39.0–52.0)
Hemoglobin: 9.8 g/dL — ABNORMAL LOW (ref 13.0–17.0)
MCH: 29.4 pg (ref 26.0–34.0)
MCHC: 31.4 g/dL (ref 30.0–36.0)
MCV: 93.7 fL (ref 80.0–100.0)
Platelets: 205 10*3/uL (ref 150–400)
RBC: 3.33 MIL/uL — ABNORMAL LOW (ref 4.22–5.81)
RDW: 18.6 % — ABNORMAL HIGH (ref 11.5–15.5)
WBC: 5 10*3/uL (ref 4.0–10.5)
nRBC: 0 % (ref 0.0–0.2)

## 2021-12-09 LAB — LACTATE DEHYDROGENASE: LDH: 167 U/L (ref 98–192)

## 2021-12-09 LAB — PROTIME-INR
INR: 2 — ABNORMAL HIGH (ref 0.8–1.2)
Prothrombin Time: 23 seconds — ABNORMAL HIGH (ref 11.4–15.2)

## 2021-12-09 LAB — TSH: TSH: 5.382 u[IU]/mL — ABNORMAL HIGH (ref 0.350–4.500)

## 2021-12-09 MED ORDER — MIDODRINE HCL 10 MG PO TABS: 10.0000 mg | ORAL_TABLET | ORAL | 2 refills | Status: DC

## 2021-12-09 NOTE — Progress Notes (Addendum)
Speed  Flow  PI  Power  LVIDD  AI  Aortic opening MR  TR  Septum  RV  VTI (>18cm)  ?5200 3.8 6.5 3.7 5.1 trace 5/5  trace  sev midline enlarged   ?5300 ? 4.1 5.3 3.9 5.1 trace 5/5 trace sev midline    ? ?             ? ?             ? ?             ? ?             ? ?Doppler MAP: 106 ?Auto cuff BP:  121/90 (101) ?  ?Ramp ECHO performed at bedside per Dr Aundra Dubin ? ?At completion of ramp study, patients primary controller programmed:  ?Fixed speed: 5300 ?Low speed limit: 5000 ? ? ? ?[name] RN, VAD Coordinator ?24/7 pager [phone] ? ? ?

## 2021-12-09 NOTE — Progress Notes (Addendum)
Patient presents for 2 week f/u in [facility] today with wife. Reports no problems with VAD equipment or concerns with drive line. ? ?Patient arrived ambulatory with no assistance.  ? ?Pt tells me that dialysis is going well and that he feels the dialysis team is very "thorough." He states that he did increase the midodrine to 15 mg as instructed at his last visit but "it made me sick." So pt decreased his dose back to 10 and hasnt had any issues. ? ?Pts driveline is healing and the drainage is decreasing. See below for full note and pics. ? ?RAMP echo performed today. Pts speed increased to 5300 see separate note for full details. ? ?Vital Signs:  ?Doppler Pressure: 106 ?Automatc BP: 121/90 (101) ?HR:  72 ?SPO2: 100 ?  ?Weight: 161.4 lb w/ eqt ?Last weight: 164 lbs w/eqt ?Home weights: 151 - 153 lbs ?  ?VAD Indication: ?Destination Therapy due to age and kidney disease``  ?  ?LVAD assessment:  HM III: ?Speed: 5200 rpms ?Flow: 3.8 ?Power: 3.7w    ?PI: 6.5 ?Alarms: none ?Events: rare with outlier on 3/15 of 40+ ? ?Fixed speed: 5200 ?Low speed limit: 4900 ? ?Primary controller: back up battery due for replacement in 30 months ?Secondary controller:  back up battery due for replacement in 30 months ?  ?I reviewed the LVAD parameters from today and compared the results to the patient's prior recorded data. LVAD interrogation was NEGATIVE for significant power changes, NEGATIVE for clinical alarms and STABLE for PI events/speed drops. No programming changes were made and pump is functioning within specified parameters. Pt is performing daily controller and system monitor self tests along with completing weekly and monthly maintenance for LVAD equipment. ?  ?LVAD equipment check completed and is in good working order. Back-up equipment present. Charged back up battery and performed self-test on equipment.  ?  ?Annual Equipment Maintenance on UBC/PM was performed on 10/2021.  ? ?Exit Site Care: ?VAD dressing and anchor  removed and site care performed using sterile technique. Drive line exit site cleaned with Chlora prep applicators x 2, rinsed with saline, and allowed to dry before gauze dressing and Aquacel silver strip applied. Exit site unincorporated, the velour is fully implanted at exit site. Small amount of serous drainage with no redness, tenderness, or foul odor noted. Drive line anchor re-applied. Pt denies fever or chills. Provided patient with 7 daily kits and 7 anchors.  ? ? ? ? ?Device:  [employer] dual  ?Therapies: on ?Pacing: AV paced 60 >> increased to 70  ?Last check: Latitude today per Joey Deakins ?  ?BP & Labs:  ?MAP 106 - Doppler is reflecting MAP ?  ?Hgb 8.8 - No S/S of bleeding. Specifically denies melena/BRBPR or nosebleeds. ?  ?LDH stable at 197 with established baseline of 200 - 300. Denies tea-colored urine. No power elevations noted on interrogation.  ? ?3 mo Intermacs follow up completed including:  Quality of Life, KCCQ-12, and Neurocognitive trail making.  ? ?Pt completed 1200 feet during 6 minute walk. ? ?[facility] Cardiomyopathy Questionnaire  ?KCCQ-12 12/09/2021  ?1 a. Ability to shower/bathe Not at all limited  ?1 b. Ability to walk 1 block Quite a bit limited  ?1 c. Ability to hurry/jog Slightly limited  ?2. Edema feet/ankles/legs Less than once a week  ?3. Limited by fatigue 3+ times per week, not every day  ?4. Limited by dyspnea At least once a day  ?5. Sitting up / on 3+ pillows  Never over the past 2 weeks  ?6. Limited enjoyment of life Not limited at all  ?7. Rest of life w/ symptoms Completely satisfied  ?8 a. Participation in hobbies Limited quite a bit  ?8 b. Participation in chores Limited quite a bit  ?8 c. Visiting family/friends Limited quite a bit  ?  ?Patient Instructions:  ?Decrease Midodrine to 10 mg only on dialysis days ?Return to clinic in 6 weeks ? ? ?[name], RN ?VAD Coordinator  ?  ?Cardiology: Dr. Aundra Dubin ?PCP: [name], MD ? ?Follow up for Heart  Failure/LVAD: ?71 y.o. with history of CAD, HTN, atrial fibrillation, and chronic systolic CHF returns for followup of CHF/LVAD.  ? ?Patient has had a long-standing cardiomyopathy, EF had been in the 25% range for years.  He had PCI to OM1 in 2007 and RCA in 2013.  From his report, these episodes do not sound like ACS events.  Echo in 12/22 showed EF < 20%, severe LV dilation, restrictive diastolic function, moderate RV dysfunction, moderate MR, mod-severe TR. HF was been complicated by CKD stage 3. He was also noted on device interrogation to have been in atrial fibrillation persistently since 10/22.  He was admitted at [facility] in 12/22.  BP was low, his BP-active meds were stopped and midodrine was begun.  He was diuresed and discharged home.   ? ?He reports ongoing severe dyspnea and was admitted again in 12/22 after he stood up then passed out at home.  Device interrogation showed run of VT treated x 2 with ATP possibly around the time of syncope.  RHC in 12/22 showed low output HF and he developed cardiogenic shock. Impella 5.5 was placed.  On 09/17/21, HM3 LVAD was placed, ICD leads were plastered to the tricuspid valve with severe TR, the valve was not replaced.  The patient had significant interoperative hypotension.  Post-operatively, he developed progressive renal failure and was started on CVVH then iHD.  He had a bumpy post-op course with renal failure and RV failure, but was ultimately able to discharge home on 10/24/21.  ? ?Ramp echo done today, RV mildly dilated with normal systolic function, severe TR, LV EF 20-25%, aortic valve opens every beat.  Speed increased to 5300 rpm. The IV septum was mildly leftwards at 5300 rpm but flow increased and AoV still opened every beat.  ? ?He is getting stronger.  Tolerating dialysis.  Does better with lower midodrine dose pre-HD, 10 mg.  Fatigues if he walks a long distance though he was able to walk around the [facility] recently. Appetite improving.  No  lightheadedness.  Weight down 3 lbs.  ? ?Denies LVAD alarms.  Denies driveline trauma, erythema or drainage.  Denies ICD shocks.   ?Reports taking Coumadin as prescribed and adherence to anticoagulation based dietary restrictions.  Denies bright red blood per rectum or melena, no dark urine or hematuria.   ? ?PMH: ?1. HTN ?2. Hyperlipidemia ?3. CAD: H/o PCI to [place] 2007 and PCI to RCA in 2013.  Uncertain of circumstances but do not sound like ACS events.  ?4. ESRD ?5. Atrial fibrillation: Paroxysmal ?6. Chronic systolic CHF: Long-standing cardiomyopathy with EF in the 25% range.  Cause uncertain, has history of PCI to RCA and OM1, but degree of CAD does not seem to explain severity of cardiomyopathy. Father had history of CAD/CHF.  Not heavy drinker. Has [employer] ICD.  ?- Echo (12/22): EF < 20%, severe LV dilation, restrictive diastolic function, moderate RV dysfunction, moderate  MR, mod-severe TR.  ?- 12/22 cardiogenic shock with Impella 5.5 placement then Heartmate 3 LVAD.  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  ?7. VT ?8. RV failure/severe TR ? ? ?Current Outpatient Medications  ?Medication Sig Dispense Refill  ? amiodarone (PACERONE) 200 MG tablet Take 0.5 tablets (100 mg total) by mouth daily. 30 tablet 6  ? ondansetron (ZOFRAN) 4 MG tablet Take 1 tablet (4 mg total) by mouth every 8 (eight) hours as needed for nausea or vomiting. 40 tablet 3  ? albuterol (VENTOLIN HFA) 108 (90 Base) MCG/ACT inhaler Inhale 2 puffs into the lungs every 6 (six) hours as needed. (Patient not taking: Reported on 12/09/2021)    ? [START ON 12/10/2021] midodrine (PROAMATINE) 10 MG tablet Take 1 tablet (10 mg total) by mouth 3 (three) times a week. Take 10 mg (1 tablet) Monday, Wednesday, Friday 30 minutes before hemodialysis session 60 tablet 2  ? mirtazapine (REMERON) 7.5 MG tablet Take 1 tablet (7.5 mg total) by mouth at bedtime. 30 tablet 6  ? Multiple Vitamins-Minerals  (CERTAVITE/ANTIOXIDANTS) TABS Take 1 tablet by mouth at bedtime. (Patient not taking: Reported on 12/09/2021) 90 tablet 3  ? pantoprazole (PROTONIX) 40 MG tablet Take 1 tablet (40 mg total) by mouth daily. 30 tablet 6  ? predniSO

## 2021-12-09 NOTE — Patient Instructions (Signed)
Decrease Midodrine to 10 mg only on dialysis days ?Return to clinic in 6 weeks ?

## 2021-12-09 NOTE — Progress Notes (Signed)
LVAD INR 

## 2021-12-09 NOTE — Progress Notes (Signed)
?  Echocardiogram ?2D Echocardiogram has been performed. ? ?Fidel Levy ?12/09/2021, 11:50 AM ?

## 2021-12-18 ENCOUNTER — Ambulatory Visit (HOSPITAL_COMMUNITY): Payer: Self-pay | Admitting: Pharmacist

## 2021-12-18 LAB — POCT INR: INR: 3.3 — AB (ref 2.0–3.0)

## 2021-12-18 NOTE — Progress Notes (Signed)
LVAD INR 

## 2021-12-25 ENCOUNTER — Ambulatory Visit (HOSPITAL_COMMUNITY): Payer: Self-pay | Admitting: Pharmacist

## 2021-12-25 LAB — POCT INR: INR: 1.4 — AB (ref 2.0–3.0)

## 2021-12-25 NOTE — Progress Notes (Signed)
LVAD INR 

## 2021-12-31 ENCOUNTER — Encounter: Payer: Medicare HMO | Attending: Cardiology | Admitting: *Deleted

## 2021-12-31 ENCOUNTER — Encounter: Payer: Self-pay | Admitting: *Deleted

## 2021-12-31 DIAGNOSIS — I5022 Chronic systolic (congestive) heart failure: Secondary | ICD-10-CM | POA: Insufficient documentation

## 2021-12-31 DIAGNOSIS — Z95811 Presence of heart assist device: Secondary | ICD-10-CM

## 2021-12-31 NOTE — Progress Notes (Signed)
Initial telephone orientation completed. Diagnosis can be found in [facility] 3/14. EP orientation scheduled for Tuesday 4/18 at 10am. ?

## 2022-01-01 ENCOUNTER — Ambulatory Visit (HOSPITAL_COMMUNITY): Payer: Self-pay | Admitting: Pharmacist

## 2022-01-01 LAB — POCT INR: INR: 1.4 — AB (ref 2.0–3.0)

## 2022-01-01 NOTE — Progress Notes (Signed)
LVAD INR 

## 2022-01-06 VITALS — Ht 70.5 in | Wt 169.3 lb

## 2022-01-06 DIAGNOSIS — Z95811 Presence of heart assist device: Secondary | ICD-10-CM

## 2022-01-06 DIAGNOSIS — I5022 Chronic systolic (congestive) heart failure: Secondary | ICD-10-CM | POA: Diagnosis present

## 2022-01-06 NOTE — Patient Instructions (Signed)
Patient Instructions ? ?Patient Details  ?Name: Isaac [name] ?MRN: [id number] ?Date of Birth: May 09, 1951 ?Referring Provider:  Larey Dresser, MD ? ?Below are your personal goals for exercise, nutrition, and risk factors. Our goal is to help you stay on track towards obtaining and maintaining these goals. We will be discussing your progress on these goals with you throughout the program. ? ?Initial Exercise Prescription: ? Initial Exercise Prescription - 01/06/22 1500   ? ?  ? Date of Initial Exercise RX and Referring Provider  ? Date 01/06/22   ? Referring Provider Loralie Champagne MD   ?  ? Oxygen  ? Maintain Oxygen Saturation 88% or higher   ?  ? REL-XR  ? Level 2   ? Speed 50   ? Minutes 15   ? METs 3.2   ?  ? Biostep-RELP  ? Level 2   ? SPM 50   ? Minutes 15   ? METs 3.2   ?  ? Track  ? Laps 34   ? Minutes 15   ? METs 2.85   ?  ? Prescription Details  ? Frequency (times per week) 2   ? Duration Progress to 30 minutes of continuous aerobic without signs/symptoms of physical distress   ?  ? Intensity  ? THRR 40-80% of Max Heartrate 103- 135   ? Ratings of Perceived Exertion 11-13   ? Perceived Dyspnea 0-4   ?  ? Progression  ? Progression Continue to progress workloads to maintain intensity without signs/symptoms of physical distress.   ?  ? Resistance Training  ? Training Prescription Yes   ? Weight 3 lb   ? Reps 10-15   ? ?  ?  ? ?  ? ? ?Exercise Goals: ?Frequency: Be able to perform aerobic exercise two to three times per week in program working toward 2-5 days per week of home exercise. ? ?Intensity: Work with a perceived exertion of 11 (fairly light) - 15 (hard) while following your exercise prescription.  We will make changes to your prescription with you as you progress through the program. ?  ?Duration: Be able to do 30 to 45 minutes of continuous aerobic exercise in addition to a 5 minute warm-up and a 5 minute cool-down routine. ?  ?Nutrition Goals: ?Your personal nutrition goals will be established  when you do your nutrition analysis with the dietician. ? ?The following are general nutrition guidelines to follow: ?Cholesterol < 200mg /day ?Sodium < 1500mg /day ?Fiber: Men over 50 yrs - 30 grams per day ? ?Personal Goals: ? Personal Goals and Risk Factors at Admission - 01/06/22 1538   ? ?  ? Core Components/Risk Factors/Patient Goals on Admission  ?  Weight Management Yes;Weight Maintenance   ? Intervention Weight Management: Develop a combined nutrition and exercise program designed to reach desired caloric intake, while maintaining appropriate intake of nutrient and fiber, sodium and fats, and appropriate energy expenditure required for the weight goal.;Weight Management: Provide education and appropriate resources to help participant work on and attain dietary goals.;Weight Management/Obesity: Establish reasonable short term and long term weight goals.   ? Admit Weight 169 lb (76.7 kg)   ? Goal Weight: Short Term 169 lb (76.7 kg)   ? Goal Weight: Long Term 169 lb (76.7 kg)   ? Expected Outcomes Short Term: Continue to assess and modify interventions until short term weight is achieved;Long Term: Adherence to nutrition and physical activity/exercise program aimed toward attainment of established weight goal;Weight Maintenance: Understanding of  the daily nutrition guidelines, which includes 25-35% calories from fat, 7% or less cal from saturated fats, less than 200mg  cholesterol, less than 1.5gm of sodium, & 5 or more servings of fruits and vegetables daily;Understanding recommendations for meals to include 15-35% energy as protein, 25-35% energy from fat, 35-60% energy from carbohydrates, less than 200mg  of dietary cholesterol, 20-35 gm of total fiber daily;Understanding of distribution of calorie intake throughout the day with the consumption of 4-5 meals/snacks   ? Heart Failure Yes   ? Intervention Provide a combined exercise and nutrition program that is supplemented with education, support and counseling  about heart failure. Directed toward relieving symptoms such as shortness of breath, decreased exercise tolerance, and extremity edema.   ? Expected Outcomes Improve functional capacity of life;Short term: Attendance in program 2-3 days a week with increased exercise capacity. Reported lower sodium intake. Reported increased fruit and vegetable intake. Reports medication compliance.;Short term: Daily weights obtained and reported for increase. Utilizing diuretic protocols set by physician.;Long term: Adoption of self-care skills and reduction of barriers for early signs and symptoms recognition and intervention leading to self-care maintenance.   ? Hypertension Yes   ? Intervention Provide education on lifestyle modifcations including regular physical activity/exercise, weight management, moderate sodium restriction and increased consumption of fresh fruit, vegetables, and low fat dairy, alcohol moderation, and smoking cessation.;Monitor prescription use compliance.   ? Expected Outcomes Short Term: Continued assessment and intervention until BP is < 140/36mm HG in hypertensive participants. < 130/45mm HG in hypertensive participants with diabetes, heart failure or chronic kidney disease.;Long Term: Maintenance of blood pressure at goal levels.   ? ?  ?  ? ?  ? ? ?Tobacco Use Initial Evaluation: ?Social History  ? ?Tobacco Use  ?Smoking Status Former  ?Smokeless Tobacco Never  ? ? ?Exercise Goals and Review: ? Exercise Goals   ? ? [place] Name 01/06/22 1538  ?  ?  ?  ?  ?  ? Exercise Goals  ? Increase Physical Activity Yes      ? Intervention Provide advice, education, support and counseling about physical activity/exercise needs.;Develop an individualized exercise prescription for aerobic and resistive training based on initial evaluation findings, risk stratification, comorbidities and participant's personal goals.      ? Expected Outcomes Short Term: Attend rehab on a regular basis to increase amount of physical  activity.;Long Term: Exercising regularly at least 3-5 days a week.;Long Term: Add in home exercise to make exercise part of routine and to increase amount of physical activity.      ? Increase Strength and Stamina Yes      ? Intervention Provide advice, education, support and counseling about physical activity/exercise needs.;Develop an individualized exercise prescription for aerobic and resistive training based on initial evaluation findings, risk stratification, comorbidities and participant's personal goals.      ? Expected Outcomes Short Term: Increase workloads from initial exercise prescription for resistance, speed, and METs.;Short Term: Perform resistance training exercises routinely during rehab and add in resistance training at home;Long Term: Improve cardiorespiratory fitness, muscular endurance and strength as measured by increased METs and functional capacity (6MWT)      ? Able to understand and use rate of perceived exertion (RPE) scale Yes      ? Intervention Provide education and explanation on how to use RPE scale      ? Expected Outcomes Short Term: Able to use RPE daily in rehab to express subjective intensity level;Long Term:  Able to use RPE to guide  intensity level when exercising independently      ? Able to understand and use Dyspnea scale Yes      ? Intervention Provide education and explanation on how to use Dyspnea scale      ? Expected Outcomes Short Term: Able to use Dyspnea scale daily in rehab to express subjective sense of shortness of breath during exertion;Long Term: Able to use Dyspnea scale to guide intensity level when exercising independently      ? Knowledge and understanding of Target Heart Rate Range (THRR) Yes      ? Intervention Provide education and explanation of THRR including how the numbers were predicted and where they are located for reference      ? Expected Outcomes Short Term: Able to use daily as guideline for intensity in rehab;Long Term: Able to use THRR to  govern intensity when exercising independently;Short Term: Able to state/look up THRR      ? Able to check pulse independently Yes      ? Intervention Review the importance of being able to check your own

## 2022-01-06 NOTE — Progress Notes (Signed)
Cardiac Individual Treatment Plan ? ?Patient Details  ?Name: Taggert Bozzi ?MRN: [id number] ?Date of Birth: 1950-10-01 ?Referring Provider:   ?Flowsheet Row Cardiac Rehab from 01/06/2022 in [facility] Cardiac and Pulmonary Rehab  ?Referring Provider Loralie Champagne MD  ? ?  ? ? ?Initial Encounter Date:  ?Flowsheet Row Cardiac Rehab from 01/06/2022 in [facility] Cardiac and Pulmonary Rehab  ?Date 01/06/22  ? ?  ? ? ?Visit Diagnosis: Heart failure, chronic systolic (HCC) ? ?LVAD (left ventricular assist device) present ([place]) ? ?Patient's Home Medications on Admission: ? ?Current Outpatient Medications:  ?  albuterol (VENTOLIN HFA) 108 (90 Base) MCG/ACT inhaler, Inhale 2 puffs into the lungs every 6 (six) hours as needed. (Patient not taking: Reported on 12/09/2021), Disp: , Rfl:  ?  amiodarone (PACERONE) 200 MG tablet, Take 0.5 tablets (100 mg total) by mouth daily., Disp: 30 tablet, Rfl: 6 ?  midodrine (PROAMATINE) 10 MG tablet, Take 1 tablet (10 mg total) by mouth 3 (three) times a week. Take 10 mg (1 tablet) Monday, Wednesday, Friday 30 minutes before hemodialysis session, Disp: 60 tablet, Rfl: 2 ?  mirtazapine (REMERON) 7.5 MG tablet, Take 1 tablet (7.5 mg total) by mouth at bedtime., Disp: 30 tablet, Rfl: 6 ?  Multiple Vitamins-Minerals (CERTAVITE/ANTIOXIDANTS) TABS, Take 1 tablet by mouth at bedtime. (Patient not taking: Reported on 12/09/2021), Disp: 90 tablet, Rfl: 3 ?  ondansetron (ZOFRAN) 4 MG tablet, Take 1 tablet (4 mg total) by mouth every 8 (eight) hours as needed for nausea or vomiting., Disp: 40 tablet, Rfl: 3 ?  pantoprazole (PROTONIX) 40 MG tablet, Take 1 tablet (40 mg total) by mouth daily., Disp: 30 tablet, Rfl: 6 ?  predniSONE (DELTASONE) 10 MG tablet, Take 40 mg x 3 days then 20 mg x three days then stop (Patient not taking: Reported on 12/09/2021), Disp: 18 tablet, Rfl: 0 ?  rosuvastatin (CRESTOR) 10 MG tablet, Take 1 tablet (10 mg total) by mouth daily., Disp: 30 tablet, Rfl: 6 ?  sildenafil (REVATIO) 20 MG  tablet, Take 1 tablet (20 mg total) by mouth 3 (three) times daily., Disp: 90 tablet, Rfl: 6 ?  warfarin (COUMADIN) 2 MG tablet, Take 4 mg (2 tabs) every Monday and 2 mg (1 tab) all other days or as directed by the [facility]., Disp: 90 tablet, Rfl: 11 ? ?Past Medical History: ?Past Medical History:  ?Diagnosis Date  ? Arrhythmia   ? atrial fibrillation  ? CHF (congestive heart failure) ([place])   ? Chronic kidney disease   ? Coronary artery disease   ? Hyperlipidemia   ? Hypertension   ? Myocardial infarct [facility])   ? ? ?Tobacco Use: ?Social History  ? ?Tobacco Use  ?Smoking Status Former  ?Smokeless Tobacco Never  ? ? ?Labs: ?Review Flowsheet   ? ?  ?  Latest Ref Rng & Units 10/18/2021 10/19/2021 10/20/2021 10/21/2021  ?Labs for ITP Cardiac and Pulmonary Rehab  ?O2 Saturation % 54.2   50.9   57.5   58.5    ? ?  10/22/2021  ?Labs for ITP Cardiac and Pulmonary Rehab  ?O2 Saturation 86.5    ?  ? ? Multiple values from one day are sorted in reverse-chronological order  ?  ?  ? ? ? ?Exercise Target Goals: ?Exercise Program Goal: ?Individual exercise prescription set using results from initial 6 min walk test and THRR while considering  patient?s activity barriers and safety.  ? ?Exercise Prescription Goal: ?Initial exercise prescription builds to 30-45 minutes a day of  aerobic activity, 2-3 days per week.  Home exercise guidelines will be given to patient during program as part of exercise prescription that the participant will acknowledge. ? ? ?Education: Aerobic Exercise: ?- Group verbal and visual presentation on the components of exercise prescription. Introduces F.I.T.T principle from ACSM for exercise prescriptions.  Reviews F.I.T.T. principles of aerobic exercise including progression. Written material given at graduation. ? ? ?Education: Resistance Exercise: ?- Group verbal and visual presentation on the components of exercise prescription. Introduces F.I.T.T principle from ACSM for exercise prescriptions  Reviews  F.I.T.T. principles of resistance exercise including progression. Written material given at graduation. ? ?  ?Education: Exercise & Equipment Safety: ?- Individual verbal instruction and demonstration of equipment use and safety with use of the equipment. ?Flowsheet Row Cardiac Rehab from 01/06/2022 in [facility] Cardiac and Pulmonary Rehab  ?Education need identified 01/06/22  ?Date 01/06/22  ?Educator KL  ?Instruction Review Code 2- Demonstrated Understanding  ? ?  ? ? ?Education: Exercise Physiology & General Exercise Guidelines: ?- Group verbal and written instruction with models to review the exercise physiology of the cardiovascular system and associated critical values. Provides general exercise guidelines with specific guidelines to those with heart or lung disease.  ?Flowsheet Row Cardiac Rehab from 01/06/2022 in [facility] Cardiac and Pulmonary Rehab  ?Education need identified 01/06/22  ? ?  ? ? ?Education: Flexibility, Balance, Mind/Body Relaxation: ?- Group verbal and visual presentation with interactive activity on the components of exercise prescription. Introduces F.I.T.T principle from ACSM for exercise prescriptions. Reviews F.I.T.T. principles of flexibility and balance exercise training including progression. Also discusses the mind body connection.  Reviews various relaxation techniques to help reduce and manage stress (i.e. Deep breathing, progressive muscle relaxation, and visualization). Balance handout provided to take home. Written material given at graduation. ? ? ?Activity Barriers & Risk Stratification: ? Activity Barriers & Cardiac Risk Stratification - 01/06/22 1515   ? ?  ? Activity Barriers & Cardiac Risk Stratification  ? Activity Barriers Joint Problems;Muscular Weakness;Decreased Ventricular Function;Other (comment)   ? Comments Right hand weakness, LVAD   ? Cardiac Risk Stratification High   ? ?  ?  ? ?  ? ? ?6 Minute Walk: ? 6 Minute Walk   ? ? [place] Name 01/06/22 1516  ?  ?  ?  ? 6 Minute Walk   ? Phase Initial    ? Distance 1435 feet    ? Walk Time 6 minutes    ? # of Rest Breaks 0    ? MPH 2.71    ? METS 3.21    ? RPE 10    ? Perceived Dyspnea  0    ? VO2 Peak 11.26    ? Symptoms No    ? Resting HR 71 bpm    ? Resting BP --  84- doppler    ? Resting Oxygen Saturation  99 %    ? Exercise Oxygen Saturation  during 6 min walk 99 %    ? Max Ex. HR 93 bpm    ? Max Ex. BP --  104- doppler    ? 2 Minute Post BP --  94-doppler    ? ?  ?  ? ?  ? ? ?Oxygen Initial Assessment: ? ? ?Oxygen Re-Evaluation: ? ? ?Oxygen Discharge (Final Oxygen Re-Evaluation): ? ? ?Initial Exercise Prescription: ? Initial Exercise Prescription - 01/06/22 1500   ? ?  ? Date of Initial Exercise RX and Referring Provider  ? Date 01/06/22   ?  Referring Provider Loralie Champagne MD   ?  ? Oxygen  ? Maintain Oxygen Saturation 88% or higher   ?  ? REL-XR  ? Level 2   ? Speed 50   ? Minutes 15   ? METs 3.2   ?  ? Biostep-RELP  ? Level 2   ? SPM 50   ? Minutes 15   ? METs 3.2   ?  ? Track  ? Laps 34   ? Minutes 15   ? METs 2.85   ?  ? Prescription Details  ? Frequency (times per week) 2   ? Duration Progress to 30 minutes of continuous aerobic without signs/symptoms of physical distress   ?  ? Intensity  ? THRR 40-80% of Max Heartrate 103- 135   ? Ratings of Perceived Exertion 11-13   ? Perceived Dyspnea 0-4   ?  ? Progression  ? Progression Continue to progress workloads to maintain intensity without signs/symptoms of physical distress.   ?  ? Resistance Training  ? Training Prescription Yes   ? Weight 3 lb   ? Reps 10-15   ? ?  ?  ? ?  ? ? ?Perform Capillary Blood Glucose checks as needed. ? ?Exercise Prescription Changes: ? ? Exercise Prescription Changes   ? ? [place] Name 01/06/22 1500  ?  ?  ?  ?  ?  ? Response to Exercise  ? Blood Pressure (Admit) --  84- doppler      ? Blood Pressure (Exercise) --  104- doppler      ? Blood Pressure (Exit) --  94 - doppler      ? Heart Rate (Admit) 71 bpm      ? Heart Rate (Exercise) 93 bpm      ? Heart Rate  (Exit) 79 bpm      ? Oxygen Saturation (Admit) 99 %      ? Oxygen Saturation (Exercise) 99 %      ? Rating of Perceived Exertion (Exercise) 10      ? Perceived Dyspnea (Exercise) 0      ? Symptoms none

## 2022-01-07 ENCOUNTER — Encounter: Payer: Self-pay | Admitting: *Deleted

## 2022-01-07 DIAGNOSIS — I5022 Chronic systolic (congestive) heart failure: Secondary | ICD-10-CM

## 2022-01-07 DIAGNOSIS — Z95811 Presence of heart assist device: Secondary | ICD-10-CM

## 2022-01-07 NOTE — Progress Notes (Signed)
Cardiac Individual Treatment Plan ? ?Patient Details  ?Name: Isaac [name] ?MRN: [id number] ?Date of Birth: 11-Jan-1951 ?Referring Provider:   ?Flowsheet Row Cardiac Rehab from 01/06/2022 in [facility] Cardiac and Pulmonary Rehab  ?Referring Provider Loralie Champagne MD  ? ?  ? ? ?Initial Encounter Date:  ?Flowsheet Row Cardiac Rehab from 01/06/2022 in [facility] Cardiac and Pulmonary Rehab  ?Date 01/06/22  ? ?  ? ? ?Visit Diagnosis: Heart failure, chronic systolic (HCC) ? ?LVAD (left ventricular assist device) present ([place]) ? ?Patient's Home Medications on Admission: ? ?Current Outpatient Medications:  ?  albuterol (VENTOLIN HFA) 108 (90 Base) MCG/ACT inhaler, Inhale 2 puffs into the lungs every 6 (six) hours as needed. (Patient not taking: Reported on 12/09/2021), Disp: , Rfl:  ?  amiodarone (PACERONE) 200 MG tablet, Take 0.5 tablets (100 mg total) by mouth daily., Disp: 30 tablet, Rfl: 6 ?  midodrine (PROAMATINE) 10 MG tablet, Take 1 tablet (10 mg total) by mouth 3 (three) times a week. Take 10 mg (1 tablet) Monday, Wednesday, Friday 30 minutes before hemodialysis session, Disp: 60 tablet, Rfl: 2 ?  mirtazapine (REMERON) 7.5 MG tablet, Take 1 tablet (7.5 mg total) by mouth at bedtime., Disp: 30 tablet, Rfl: 6 ?  Multiple Vitamins-Minerals (CERTAVITE/ANTIOXIDANTS) TABS, Take 1 tablet by mouth at bedtime. (Patient not taking: Reported on 12/09/2021), Disp: 90 tablet, Rfl: 3 ?  ondansetron (ZOFRAN) 4 MG tablet, Take 1 tablet (4 mg total) by mouth every 8 (eight) hours as needed for nausea or vomiting., Disp: 40 tablet, Rfl: 3 ?  pantoprazole (PROTONIX) 40 MG tablet, Take 1 tablet (40 mg total) by mouth daily., Disp: 30 tablet, Rfl: 6 ?  predniSONE (DELTASONE) 10 MG tablet, Take 40 mg x 3 days then 20 mg x three days then stop (Patient not taking: Reported on 12/09/2021), Disp: 18 tablet, Rfl: 0 ?  rosuvastatin (CRESTOR) 10 MG tablet, Take 1 tablet (10 mg total) by mouth daily., Disp: 30 tablet, Rfl: 6 ?  sildenafil (REVATIO) 20 MG  tablet, Take 1 tablet (20 mg total) by mouth 3 (three) times daily., Disp: 90 tablet, Rfl: 6 ?  warfarin (COUMADIN) 2 MG tablet, Take 4 mg (2 tabs) every Monday and 2 mg (1 tab) all other days or as directed by the [facility]., Disp: 90 tablet, Rfl: 11 ? ?Past Medical History: ?Past Medical History:  ?Diagnosis Date  ? Arrhythmia   ? atrial fibrillation  ? CHF (congestive heart failure) ([place])   ? Chronic kidney disease   ? Coronary artery disease   ? Hyperlipidemia   ? Hypertension   ? Myocardial infarct [facility])   ? ? ?Tobacco Use: ?Social History  ? ?Tobacco Use  ?Smoking Status Former  ?Smokeless Tobacco Never  ? ? ?Labs: ?Review Flowsheet   ? ?  ?  Latest Ref Rng & Units 10/18/2021 10/19/2021 10/20/2021 10/21/2021  ?Labs for ITP Cardiac and Pulmonary Rehab  ?O2 Saturation % 54.2   50.9   57.5   58.5    ? ?  10/22/2021  ?Labs for ITP Cardiac and Pulmonary Rehab  ?O2 Saturation 86.5    ?  ? ? Multiple values from one day are sorted in reverse-chronological order  ?  ?  ? ? ? ?Exercise Target Goals: ?Exercise Program Goal: ?Individual exercise prescription set using results from initial 6 min walk test and THRR while considering  patient?s activity barriers and safety.  ? ?Exercise Prescription Goal: ?Initial exercise prescription builds to 30-45 minutes a day of  aerobic activity, 2-3 days per week.  Home exercise guidelines will be given to patient during program as part of exercise prescription that the participant will acknowledge. ? ? ?Education: Aerobic Exercise: ?- Group verbal and visual presentation on the components of exercise prescription. Introduces F.I.T.T principle from ACSM for exercise prescriptions.  Reviews F.I.T.T. principles of aerobic exercise including progression. Written material given at graduation. ? ? ?Education: Resistance Exercise: ?- Group verbal and visual presentation on the components of exercise prescription. Introduces F.I.T.T principle from ACSM for exercise prescriptions  Reviews  F.I.T.T. principles of resistance exercise including progression. Written material given at graduation. ? ?  ?Education: Exercise & Equipment Safety: ?- Individual verbal instruction and demonstration of equipment use and safety with use of the equipment. ?Flowsheet Row Cardiac Rehab from 01/06/2022 in [facility] Cardiac and Pulmonary Rehab  ?Education need identified 01/06/22  ?Date 01/06/22  ?Educator KL  ?Instruction Review Code 2- Demonstrated Understanding  ? ?  ? ? ?Education: Exercise Physiology & General Exercise Guidelines: ?- Group verbal and written instruction with models to review the exercise physiology of the cardiovascular system and associated critical values. Provides general exercise guidelines with specific guidelines to those with heart or lung disease.  ?Flowsheet Row Cardiac Rehab from 01/06/2022 in [facility] Cardiac and Pulmonary Rehab  ?Education need identified 01/06/22  ? ?  ? ? ?Education: Flexibility, Balance, Mind/Body Relaxation: ?- Group verbal and visual presentation with interactive activity on the components of exercise prescription. Introduces F.I.T.T principle from ACSM for exercise prescriptions. Reviews F.I.T.T. principles of flexibility and balance exercise training including progression. Also discusses the mind body connection.  Reviews various relaxation techniques to help reduce and manage stress (i.e. Deep breathing, progressive muscle relaxation, and visualization). Balance handout provided to take home. Written material given at graduation. ? ? ?Activity Barriers & Risk Stratification: ? Activity Barriers & Cardiac Risk Stratification - 01/06/22 1515   ? ?  ? Activity Barriers & Cardiac Risk Stratification  ? Activity Barriers Joint Problems;Muscular Weakness;Decreased Ventricular Function;Other (comment)   ? Comments Right hand weakness, LVAD   ? Cardiac Risk Stratification High   ? ?  ?  ? ?  ? ? ?6 Minute Walk: ? 6 Minute Walk   ? ? [place] Name 01/06/22 1516  ?  ?  ?  ? 6 Minute Walk   ? Phase Initial    ? Distance 1435 feet    ? Walk Time 6 minutes    ? # of Rest Breaks 0    ? MPH 2.71    ? METS 3.21    ? RPE 10    ? Perceived Dyspnea  0    ? VO2 Peak 11.26    ? Symptoms No    ? Resting HR 71 bpm    ? Resting BP --  84- doppler    ? Resting Oxygen Saturation  99 %    ? Exercise Oxygen Saturation  during 6 min walk 99 %    ? Max Ex. HR 93 bpm    ? Max Ex. BP --  104- doppler    ? 2 Minute Post BP --  94-doppler    ? ?  ?  ? ?  ? ? ?Oxygen Initial Assessment: ? ? ?Oxygen Re-Evaluation: ? ? ?Oxygen Discharge (Final Oxygen Re-Evaluation): ? ? ?Initial Exercise Prescription: ? Initial Exercise Prescription - 01/06/22 1500   ? ?  ? Date of Initial Exercise RX and Referring Provider  ? Date 01/06/22   ?  Referring Provider Loralie Champagne MD   ?  ? Oxygen  ? Maintain Oxygen Saturation 88% or higher   ?  ? REL-XR  ? Level 2   ? Speed 50   ? Minutes 15   ? METs 3.2   ?  ? Biostep-RELP  ? Level 2   ? SPM 50   ? Minutes 15   ? METs 3.2   ?  ? Track  ? Laps 34   ? Minutes 15   ? METs 2.85   ?  ? Prescription Details  ? Frequency (times per week) 2   ? Duration Progress to 30 minutes of continuous aerobic without signs/symptoms of physical distress   ?  ? Intensity  ? THRR 40-80% of Max Heartrate 103- 135   ? Ratings of Perceived Exertion 11-13   ? Perceived Dyspnea 0-4   ?  ? Progression  ? Progression Continue to progress workloads to maintain intensity without signs/symptoms of physical distress.   ?  ? Resistance Training  ? Training Prescription Yes   ? Weight 3 lb   ? Reps 10-15   ? ?  ?  ? ?  ? ? ?Perform Capillary Blood Glucose checks as needed. ? ?Exercise Prescription Changes: ? ? Exercise Prescription Changes   ? ? [place] Name 01/06/22 1500  ?  ?  ?  ?  ?  ? Response to Exercise  ? Blood Pressure (Admit) --  84- doppler      ? Blood Pressure (Exercise) --  104- doppler      ? Blood Pressure (Exit) --  94 - doppler      ? Heart Rate (Admit) 71 bpm      ? Heart Rate (Exercise) 93 bpm      ? Heart Rate  (Exit) 79 bpm      ? Oxygen Saturation (Admit) 99 %      ? Oxygen Saturation (Exercise) 99 %      ? Rating of Perceived Exertion (Exercise) 10      ? Perceived Dyspnea (Exercise) 0      ? Symptoms none

## 2022-01-08 ENCOUNTER — Ambulatory Visit (HOSPITAL_COMMUNITY): Payer: Self-pay | Admitting: Pharmacist

## 2022-01-08 LAB — POCT INR: INR: 1.5 — AB (ref 2.0–3.0)

## 2022-01-08 NOTE — Progress Notes (Signed)
LVAD INR 

## 2022-01-13 DIAGNOSIS — I5022 Chronic systolic (congestive) heart failure: Secondary | ICD-10-CM

## 2022-01-13 DIAGNOSIS — Z95811 Presence of heart assist device: Secondary | ICD-10-CM

## 2022-01-13 NOTE — Progress Notes (Signed)
Daily Session Note ? ?Patient Details  ?Name: Isaac [name] ?MRN: [id number] ?Date of Birth: 08-03-1951 ?Referring Provider:   ?Flowsheet Row Cardiac Rehab from 01/06/2022 in [facility] Cardiac and Pulmonary Rehab  ?Referring Provider Loralie Champagne MD  ? ?  ? ? ?Encounter Date: 01/13/2022 ? ?Check In: ? Session Check In - 01/13/22 0759   ? ?  ? Check-In  ? Supervising physician immediately available to respond to emergencies See telemetry face sheet for immediately available ER MD   ? Location [facility]-Cardiac & Pulmonary Rehab   ? Staff Present Birdie Sons, MPA, RN;Joseph Higgins, RCP,RRT,BSRT;Jessica [place], MA, RCEP, CCRP, CCET   ? Virtual Visit No   ? Medication changes reported     No   ? Fall or balance concerns reported    No   ? Warm-up and Cool-down Performed on first and last piece of equipment   ? Resistance Training Performed Yes   ? VAD Patient? Yes   ? PAD/SET Patient? No   ?  ? VAD patient  ? Has back up controller? Yes   ? Has spare charged batteries? Yes   ? Has battery cables? Yes   ? Has compatible battery clips? Yes   ?  ? Pain Assessment  ? Currently in Pain? No/denies   ? ?  ?  ? ?  ? ? ? ? ? ?Social History  ? ?Tobacco Use  ?Smoking Status Former  ?Smokeless Tobacco Never  ? ? ?Goals Met:  ?Independence with exercise equipment ?Exercise tolerated well ?No report of concerns or symptoms today ?Strength training completed today ? ?Goals Unmet:  ?Not Applicable ? ?Comments: First full day of exercise!  Patient was oriented to gym and equipment including functions, settings, policies, and procedures.  Patient's individual exercise prescription and treatment plan were reviewed.  All starting workloads were established based on the results of the 6 minute walk test done at initial orientation visit.  The plan for exercise progression was also introduced and progression will be customized based on patient's performance and goals. ? ? ? ? ?Dr. Emily Filbert is Medical Director for [place].  ?Dr. Ottie Glazier is Medical Director for [facility] Pulmonary Rehabilitation. ?

## 2022-01-15 ENCOUNTER — Ambulatory Visit (HOSPITAL_COMMUNITY): Payer: Self-pay | Admitting: Pharmacist

## 2022-01-15 DIAGNOSIS — Z95811 Presence of heart assist device: Secondary | ICD-10-CM

## 2022-01-15 DIAGNOSIS — I5022 Chronic systolic (congestive) heart failure: Secondary | ICD-10-CM

## 2022-01-15 LAB — POCT INR: INR: 1.7 — AB (ref 2.0–3.0)

## 2022-01-15 NOTE — Progress Notes (Signed)
LVAD INR 

## 2022-01-15 NOTE — Progress Notes (Signed)
Daily Session Note ? ?Patient Details  ?Name: Isaac [name] ?MRN: [id number] ?Date of Birth: 1950/12/02 ?Referring Provider:   ?Flowsheet Row Cardiac Rehab from 01/06/2022 in [facility] Cardiac and Pulmonary Rehab  ?Referring Provider Loralie Champagne MD  ? ?  ? ? ?Encounter Date: 01/15/2022 ? ?Check In: ? Session Check In - 01/15/22 0753   ? ?  ? Check-In  ? Supervising physician immediately available to respond to emergencies See telemetry face sheet for immediately available ER MD   ? Location [facility]-Cardiac & Pulmonary Rehab   ? Staff Present Birdie Sons, MPA, RN;Joseph [place], RCP,RRT,BSRT;Melissa Barton, RDN, LDN   ? Virtual Visit No   ? Medication changes reported     No   ? Fall or balance concerns reported    No   ? Warm-up and Cool-down Performed on first and last piece of equipment   ? Resistance Training Performed Yes   ? VAD Patient? Yes   ? PAD/SET Patient? No   ?  ? VAD patient  ? Has back up controller? Yes   ? Has spare charged batteries? Yes   ? Has battery cables? Yes   ? Has compatible battery clips? Yes   ?  ? Pain Assessment  ? Currently in Pain? No/denies   ? ?  ?  ? ?  ? ? ? ? ? ?Social History  ? ?Tobacco Use  ?Smoking Status Former  ?Smokeless Tobacco Never  ? ? ?Goals Met:  ?Independence with exercise equipment ?Exercise tolerated well ?No report of concerns or symptoms today ?Strength training completed today ? ?Goals Unmet:  ?Not Applicable ? ?Comments: Pt able to follow exercise prescription today without complaint.  Will continue to monitor for progression. ? ? ? ?Dr. Emily Filbert is Medical Director for [place].  ?Dr. Ottie Glazier is Medical Director for [facility] Pulmonary Rehabilitation. ?

## 2022-01-16 ENCOUNTER — Other Ambulatory Visit (HOSPITAL_COMMUNITY): Payer: Self-pay | Admitting: *Deleted

## 2022-01-16 DIAGNOSIS — Z7901 Long term (current) use of anticoagulants: Secondary | ICD-10-CM

## 2022-01-16 DIAGNOSIS — Z95811 Presence of heart assist device: Secondary | ICD-10-CM

## 2022-01-19 NOTE — Addendum Note (Signed)
Addended by: [name] B on: 01/19/2022 04:14 PM ? ? Modules accepted: Orders ? ?

## 2022-01-20 ENCOUNTER — Encounter (HOSPITAL_COMMUNITY): Payer: Self-pay

## 2022-01-20 ENCOUNTER — Ambulatory Visit (HOSPITAL_COMMUNITY): Payer: Self-pay | Admitting: Pharmacist

## 2022-01-20 ENCOUNTER — Ambulatory Visit (HOSPITAL_COMMUNITY)
Admission: RE | Admit: 2022-01-20 | Discharge: 2022-01-20 | Disposition: A | Discharge status: Home / Self Care | Payer: Medicare HMO | Source: Ambulatory Visit | Attending: Internal Medicine | Admitting: Internal Medicine

## 2022-01-20 ENCOUNTER — Encounter: Payer: Medicare HMO | Attending: Cardiology

## 2022-01-20 DIAGNOSIS — Z79899 Other long term (current) drug therapy: Secondary | ICD-10-CM | POA: Diagnosis not present

## 2022-01-20 DIAGNOSIS — Z7901 Long term (current) use of anticoagulants: Secondary | ICD-10-CM | POA: Diagnosis not present

## 2022-01-20 DIAGNOSIS — N186 End stage renal disease: Secondary | ICD-10-CM | POA: Diagnosis not present

## 2022-01-20 DIAGNOSIS — Z992 Dependence on renal dialysis: Secondary | ICD-10-CM | POA: Insufficient documentation

## 2022-01-20 DIAGNOSIS — I071 Rheumatic tricuspid insufficiency: Secondary | ICD-10-CM | POA: Insufficient documentation

## 2022-01-20 DIAGNOSIS — I480 Paroxysmal atrial fibrillation: Secondary | ICD-10-CM | POA: Insufficient documentation

## 2022-01-20 DIAGNOSIS — Z452 Encounter for adjustment and management of vascular access device: Secondary | ICD-10-CM | POA: Insufficient documentation

## 2022-01-20 DIAGNOSIS — I2510 Atherosclerotic heart disease of native coronary artery without angina pectoris: Secondary | ICD-10-CM | POA: Insufficient documentation

## 2022-01-20 DIAGNOSIS — Z955 Presence of coronary angioplasty implant and graft: Secondary | ICD-10-CM | POA: Diagnosis not present

## 2022-01-20 DIAGNOSIS — Z95811 Presence of heart assist device: Secondary | ICD-10-CM

## 2022-01-20 DIAGNOSIS — I132 Hypertensive heart and chronic kidney disease with heart failure and with stage 5 chronic kidney disease, or end stage renal disease: Secondary | ICD-10-CM | POA: Insufficient documentation

## 2022-01-20 DIAGNOSIS — I5022 Chronic systolic (congestive) heart failure: Secondary | ICD-10-CM

## 2022-01-20 LAB — T4, FREE: Free T4: 1.49 ng/dL — ABNORMAL HIGH (ref 0.61–1.12)

## 2022-01-20 LAB — LACTATE DEHYDROGENASE: LDH: 195 U/L — ABNORMAL HIGH (ref 98–192)

## 2022-01-20 LAB — PROTIME-INR
INR: 1.6 — ABNORMAL HIGH (ref 0.8–1.2)
Prothrombin Time: 19.2 seconds — ABNORMAL HIGH (ref 11.4–15.2)

## 2022-01-20 LAB — TSH: TSH: 4.798 u[IU]/mL — ABNORMAL HIGH (ref 0.350–4.500)

## 2022-01-20 MED ORDER — WARFARIN SODIUM 2 MG PO TABS
ORAL_TABLET | ORAL | 11 refills | Status: DC
Start: 2022-01-20 — End: 2022-02-02

## 2022-01-20 MED ORDER — CERTAVITE/ANTIOXIDANTS PO TABS: 1.0000 | ORAL_TABLET | Freq: Every day | ORAL | 11 refills | Status: DC

## 2022-01-20 NOTE — Progress Notes (Signed)
LVAD INR 

## 2022-01-20 NOTE — Progress Notes (Addendum)
Patient presents for 6 week f/u in [facility] today by himself. Reports no problems with VAD equipment or concerns with drive line. ? ?Patient says he went to Cardiac Rehab at [facility] this am prior to visit. He reports starting two days/week rehab last week and is doing well. He is walking "30 laps" and riding bike for 15 minutes.  ? ?Pt reports dialysis has increased his dry weight to 73 kg. He has been tolerating some sessions "better" if "they don't pull fluid too fast". He did report Monday session of this week caused some chest pain and leg cramps. Discussed with Dr. Aundra Dubin who agrees to increasing dry weight by 2 lbs. Messaged Dr. Joelyn Oms with recommendation.  ? ?Dr. Aundra Dubin reviewed labs from 12/20/21 [facility]). BUN 7, creat 2.31. Patient reports he has  ? ?Vital Signs:  ?Temp: 98.0 ?Doppler Pressure: 90 ?Automatc BP:  103/64 (80) ?HR:  71 ?SPO2: 100% on RA ?  ?Weight: 172.6 lb w/ eqt ?Last weight: 161.4 lbs w/eqt ?  ?VAD Indication: ?Destination Therapy due to age and kidney disease ?  ?LVAD assessment:  HM III: ?Speed: 5300 rpms ?Flow: 4.2 ?Power: 3.8w    ?PI:  4.9 ?Alarms: none ?Events: rare PI events ? ?Fixed speed: 5300 ?Low speed limit: 5000 ? ?Primary controller: back up battery due for replacement in 28 months ?Secondary controller:  back up battery due for replacement in 28 months ?  ?I reviewed the LVAD parameters from today and compared the results to the patient's prior recorded data. LVAD interrogation was NEGATIVE for significant power changes, NEGATIVE for clinical alarms and STABLE for PI events/speed drops. No programming changes were made and pump is functioning within specified parameters. Pt is performing daily controller and system monitor self tests along with completing weekly and monthly maintenance for LVAD equipment. ?  ?LVAD equipment check completed and is in good working order. Back-up equipment present. Charged back up battery and performed self-test on equipment.  ?   ?Annual Equipment Maintenance on UBC/PM was performed on 10/2021.  ? ?Exit Site Care: ?Wife has been performing weekly dressing changes using gauze dressings. VAD dressing and anchor removed and site care performed using sterile technique. Drive line exit site cleaned with Chlora prep applicators x 2, rinsed with saline, and allowed to dry before Sorbaview dressing with Silverlon patch applied. Exit site incorporated, the velour is fully implanted at exit site. Very small amount of serous drainage with no tenderness or foul odor noted. Skin reddened around exit site (under steri strip) and under tape. Asked patient to call if redness does not clear with changing kit and stopping silver strip. Drive line anchor re-applied. Pt denies fever or chills. Provided patient with 7 daily kits, 5 anchors, and 8 weekly dressing kits for home use. Will change to weekly dressings if possible; may switch back to gauze dressing if drainage continues.  ? ? ?Device:  [employer] dual  ?Therapies: on ?Pacing: AV paced 60 >> increased to 70  ?Last check: 12/02/21 ?  ? ?BP & Labs:  ?Doppler 90 - Doppler is reflecting modified systolic ?  ?Hgb 10.7 - No S/S of bleeding. Specifically denies melena/BRBPR or nosebleeds. ?  ?LDH stable at 195 with established baseline of 200 - 300. Denies tea-colored urine. No power elevations noted on interrogation.  ? ? ?Patient Instructions:  ?No change in medications ?Lauren (PharmD) will call you with INR results and warfarin dosing. ?Advance to weekly dressing kits if no drainage occurs. Use daily kits  if drive line drainage occurs and call VAD Clinic. ?Return to [place] clinic in 2 months.  ? ?[name], RN ?VAD Coordinator  ?  ?Cardiology: Dr. Aundra Dubin ?PCP: [name], MD ? ?Follow up for Heart Failure/LVAD: ?71 y.o. with history of CAD, HTN, atrial fibrillation, and chronic systolic CHF returns for followup of CHF/LVAD.  ? ?Patient has had a long-standing cardiomyopathy, EF had been in the  25% range for years.  He had PCI to OM1 in 2007 and RCA in 2013.  From his report, these episodes do not sound like ACS events.  Echo in 12/22 showed EF < 20%, severe LV dilation, restrictive diastolic function, moderate RV dysfunction, moderate MR, mod-severe TR. HF was been complicated by CKD stage 3. He was also noted on device interrogation to have been in atrial fibrillation persistently since 10/22.  He was admitted at [facility] in 12/22.  BP was low, his BP-active meds were stopped and midodrine was begun.  He was diuresed and discharged home.   ? ?He reports ongoing severe dyspnea and was admitted again in 12/22 after he stood up then passed out at home.  Device interrogation showed run of VT treated x 2 with ATP possibly around the time of syncope.  RHC in 12/22 showed low output HF and he developed cardiogenic shock. Impella 5.5 was placed.  On 09/17/21, HM3 LVAD was placed, ICD leads were plastered to the tricuspid valve with severe TR, the valve was not replaced.  The patient had significant interoperative hypotension.  Post-operatively, he developed progressive renal failure and was started on CVVH then iHD.  He had a bumpy post-op course with renal failure and RV failure, but was ultimately able to discharge home on 10/24/21.  ? ?Ramp echo in 3/23, RV mildly dilated with normal systolic function, severe TR, LV EF 20-25%, aortic valve opens every beat.  Speed increased to 5300 rpm. The IV septum was mildly leftwards at 5300 rpm but flow increased and AoV still opened every beat.  ? ?Patient seems to be doing well overall. Weight is up but he is eating and exercising more.  He is doing cardiac rehab and likes how they are pushing him.  BP stable.  He is sometimes dizzy just after HD, but feels like midodrine generally keeps him stable.  No significant exertional dyspnea with his usual activities.  He is driving, going to stores, etc.  He says that he has been urinating more, and the nephrologist may start  decreasing HD sessions to twice a week.  ? ?Denies LVAD alarms.  Denies driveline trauma, erythema or drainage.  Denies ICD shocks.   ?Reports taking Coumadin as prescribed and adherence to anticoagulation based dietary restrictions.  Denies bright red blood per rectum or melena, no dark urine or hematuria.   ? ?Labs (3/23): LFTs normal, TSH mildly elevated ? ?PMH: ?1. HTN ?2. Hyperlipidemia ?3. CAD: H/o PCI to [place] 2007 and PCI to RCA in 2013.  Uncertain of circumstances but do not sound like ACS events.  ?4. ESRD ?5. Atrial fibrillation: Paroxysmal ?6. Chronic systolic CHF: Long-standing cardiomyopathy with EF in the 25% range.  Cause uncertain, has history of PCI to RCA and OM1, but degree of CAD does not seem to explain severity of cardiomyopathy. Father had history of CAD/CHF.  Not heavy drinker. Has [employer] ICD.  ?- Echo (12/22): EF < 20%, severe LV dilation, restrictive diastolic function, moderate RV dysfunction, moderate MR, mod-severe TR.  ?- 12/22 cardiogenic shock with Impella 5.5  placement then Heartmate 3 LVAD.  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  ?7. VT ?8. RV failure/severe TR ? ? ?Current Outpatient Medications  ?Medication Sig Dispense Refill  ? amiodarone (PACERONE) 200 MG tablet Take 0.5 tablets (100 mg total) by mouth daily. 30 tablet 6  ? midodrine (PROAMATINE) 10 MG tablet Take 1 tablet (10 mg total) by mouth 3 (three) times a week. Take 10 mg (1 tablet) Monday, Wednesday, Friday 30 minutes before hemodialysis session 60 tablet 2  ? mirtazapine (REMERON) 7.5 MG tablet Take 1 tablet (7.5 mg total) by mouth at bedtime. 30 tablet 6  ? pantoprazole (PROTONIX) 40 MG tablet Take 1 tablet (40 mg total) by mouth daily. 30 tablet 6  ? rosuvastatin (CRESTOR) 10 MG tablet Take 1 tablet (10 mg total) by mouth daily. 30 tablet 6  ? sildenafil (REVATIO) 20 MG tablet Take 1 tablet (20 mg total) by mouth 3 (three) times daily. 90 tablet 6  ? albuterol  (VENTOLIN HFA) 108 (90 Base) MCG/ACT inhaler Inhale 2 puffs into the lungs every 6 (six) hours as needed. (Patient not taking: Reported on 12/09/2021)    ? Multiple Vitamins-Minerals (CERTAVITE/ANTIOXIDANTS) TABS Take 1

## 2022-01-20 NOTE — Progress Notes (Signed)
Daily Session Note ? ?Patient Details  ?Name: Aniello Christopoulos ?MRN: [id number] ?Date of Birth: [birth year] ?Referring Provider:   ?Flowsheet Row Cardiac Rehab from 01/06/2022 in [facility] Cardiac and Pulmonary Rehab  ?Referring Provider Loralie Champagne MD  ? ?  ? ? ?Encounter Date: 01/20/2022 ? ?Check In: ? Session Check In - 01/20/22 0752   ? ?  ? Check-In  ? Supervising physician immediately available to respond to emergencies See telemetry face sheet for immediately available ER MD   ? Location [facility]-Cardiac & Pulmonary Rehab   ? Staff Present Birdie Sons, MPA, RN;Jessica [place], MA, RCEP, CCRP, [place], BS, ACSM CEP, Exercise Physiologist   ? Virtual Visit No   ? Medication changes reported     No   ? Fall or balance concerns reported    No   ? Warm-up and Cool-down Performed on first and last piece of equipment   ? Resistance Training Performed Yes   ? VAD Patient? Yes   ? PAD/SET Patient? No   ?  ? VAD patient  ? Has back up controller? Yes   ? Has spare charged batteries? Yes   ? Has battery cables? Yes   ? Has compatible battery clips? Yes   ?  ? Pain Assessment  ? Currently in Pain? No/denies   ? ?  ?  ? ?  ? ? ? ? ? ?Social History  ? ?Tobacco Use  ?Smoking Status Former  ?Smokeless Tobacco Never  ? ? ?Goals Met:  ?Independence with exercise equipment ?Exercise tolerated well ?No report of concerns or symptoms today ?Strength training completed today ? ?Goals Unmet:  ?Not Applicable ? ?Comments: Pt able to follow exercise prescription today without complaint.  Will continue to monitor for progression. ? ? ? ?Dr. Emily Filbert is Medical Director for [place].  ?Dr. Ottie Glazier is Medical Director for [facility] Pulmonary Rehabilitation. ?

## 2022-01-20 NOTE — Patient Instructions (Signed)
No change in medications ?Lauren (PharmD) will call you with INR results and warfarin dosing. ?Advance to weekly dressing kits if no drainage occurs. Use daily kits if drive line drainage occurs and call VAD Clinic. ?Return to [place] clinic in 2 months.  ?

## 2022-01-22 ENCOUNTER — Ambulatory Visit (HOSPITAL_COMMUNITY): Payer: Self-pay | Admitting: Pharmacist

## 2022-01-22 ENCOUNTER — Other Ambulatory Visit (HOSPITAL_COMMUNITY): Payer: Self-pay | Admitting: *Deleted

## 2022-01-22 ENCOUNTER — Telehealth (HOSPITAL_COMMUNITY): Payer: Self-pay | Admitting: *Deleted

## 2022-01-22 DIAGNOSIS — I5022 Chronic systolic (congestive) heart failure: Secondary | ICD-10-CM

## 2022-01-22 DIAGNOSIS — Z95811 Presence of heart assist device: Secondary | ICD-10-CM

## 2022-01-22 DIAGNOSIS — E079 Disorder of thyroid, unspecified: Secondary | ICD-10-CM

## 2022-01-22 LAB — POCT INR: INR: 2.1 (ref 2.0–3.0)

## 2022-01-22 NOTE — Telephone Encounter (Signed)
Called patient per Dr. Aundra Dubin. Pt thyroid levels remain abnormal. Referral sent to [facility] End[facility]. That office should be calling patient to scheduled appointment. Asked Isaac [name] to call us if he does not hear from them. Pt verbalized agreement to same. ? ?Zada Girt RN, VAD Coordinator ?[phone] ?

## 2022-01-22 NOTE — Telephone Encounter (Addendum)
Pt's wife spoke with Audry Riles, PharmD re: patient having "issues with prostate" and wanting to know what he could take for same.  Called patient per Dr. Aundra Dubin and offered to send referral to Urology for evaluation of issues with prostate. Pt says "I'm not ready for that yet".  Asked patient to let us know if he should need referral for Urology in future. Pt verbalized agreement to same.  Zada Girt RN, [place] Coordinator [phone]

## 2022-01-22 NOTE — Progress Notes (Signed)
Daily Session Note ? ?Patient Details  ?Name: Sang Blount ?MRN: [id number] ?Date of Birth: [birth year] ?Referring Provider:   ?Flowsheet Row Cardiac Rehab from 01/06/2022 in [facility] Cardiac and Pulmonary Rehab  ?Referring Provider Loralie Champagne MD  ? ?  ? ? ?Encounter Date: 01/22/2022 ? ?Check In: ? Session Check In - 01/22/22 0756   ? ?  ? Check-In  ? Supervising physician immediately available to respond to emergencies See telemetry face sheet for immediately available ER MD   ? Location [facility]-Cardiac & Pulmonary Rehab   ? Staff Present Birdie Sons, MPA, RN;Joseph [place], RCP,RRT,BSRT;Laureen Owens Shark, BS, RRT, CPFT;Melissa Lisco, RDN, LDN   ? Virtual Visit No   ? Medication changes reported     No   ? Fall or balance concerns reported    No   ? Warm-up and Cool-down Performed on first and last piece of equipment   ? Resistance Training Performed Yes   ? VAD Patient? Yes   ? PAD/SET Patient? No   ?  ? VAD patient  ? Has back up controller? Yes   ? Has spare charged batteries? Yes   ? Has battery cables? Yes   ? Has compatible battery clips? Yes   ?  ? Pain Assessment  ? Currently in Pain? No/denies   ? ?  ?  ? ?  ? ? ? ? ? ?Social History  ? ?Tobacco Use  ?Smoking Status Former  ?Smokeless Tobacco Never  ? ? ?Goals Met:  ?Independence with exercise equipment ?Exercise tolerated well ?No report of concerns or symptoms today ?Strength training completed today ? ?Goals Unmet:  ?Not Applicable ? ?Comments: Pt able to follow exercise prescription today without complaint.  Will continue to monitor for progression. ? ? ? ?Dr. Emily Filbert is Medical Director for [place].  ?Dr. Ottie Glazier is Medical Director for [facility] Pulmonary Rehabilitation. ?

## 2022-01-22 NOTE — Progress Notes (Signed)
LVAD INR 

## 2022-01-23 ENCOUNTER — Telehealth (HOSPITAL_COMMUNITY): Payer: Self-pay | Admitting: Pharmacy Technician

## 2022-01-23 NOTE — Telephone Encounter (Signed)
Patient Advocate Encounter ?  ?Received notification from [place] that prior authorization for Sildenafil is required. ?  ?PA submitted on CoverMyMeds ?Key [id number] ?Status is pending ?  ?Will continue to follow. ? ?

## 2022-01-23 NOTE — Telephone Encounter (Signed)
Advanced Heart Failure Patient Advocate Encounter ? ?Prior Authorization for Sildenafil has been approved.   ? ?Effective dates: 09/21/21 through 09/20/22 ? ?Charlann Boxer, CPhT ? ? ?

## 2022-01-27 DIAGNOSIS — I5022 Chronic systolic (congestive) heart failure: Secondary | ICD-10-CM

## 2022-01-27 DIAGNOSIS — Z95811 Presence of heart assist device: Secondary | ICD-10-CM

## 2022-01-27 NOTE — Progress Notes (Signed)
Daily Session Note ? ?Patient Details  ?Name: Isaac [name] ?MRN: [id number] ?Date of Birth: [birth year] ?Referring Provider:   ?Flowsheet Row Cardiac Rehab from 01/06/2022 in [facility] Cardiac and Pulmonary Rehab  ?Referring Provider Loralie Champagne MD  ? ?  ? ? ?Encounter Date: 01/27/2022 ? ?Check In: ? Session Check In - 01/27/22 0759   ? ?  ? Check-In  ? Supervising physician immediately available to respond to emergencies See telemetry face sheet for immediately available ER MD   ? Location [facility]-Cardiac & Pulmonary Rehab   ? Staff Present Birdie Sons, MPA, RN;Jalysa Swopes Amedeo Plenty, BS, ACSM CEP, Exercise Physiologist;Jessica Luan [place], MA, RCEP, CCRP, CCET   ? Virtual Visit No   ? Medication changes reported     No   ? Fall or balance concerns reported    No   ? Warm-up and Cool-down Performed on first and last piece of equipment   ? Resistance Training Performed Yes   ? VAD Patient? Yes   ? PAD/SET Patient? No   ?  ? VAD patient  ? Has back up controller? Yes   ? Has spare charged batteries? Yes   ? Has battery cables? Yes   ? Has compatible battery clips? Yes   ?  ? Pain Assessment  ? Currently in Pain? No/denies   ? ?  ?  ? ?  ? ? ? ? ? ?Social History  ? ?Tobacco Use  ?Smoking Status Former  ?Smokeless Tobacco Never  ? ? ?Goals Met:  ?Independence with exercise equipment ?Exercise tolerated well ?No report of concerns or symptoms today ?Strength training completed today ? ?Goals Unmet:  ?Not Applicable ? ?Comments: Pt able to follow exercise prescription today without complaint.  Will continue to monitor for progression. ? ?Reviewed home exercise with pt today.  Pt plans to walk at park for exercise.  He also has access to weight that he uses at home.  Reviewed THR, pulse, RPE, sign and symptoms, pulse oximetery and when to call 911 or MD.  Also discussed weather considerations and indoor options.  Pt voiced understanding. ? ? ?Dr. Emily Filbert is Medical Director for [facility].  ?Dr. Ottie Glazier  is Medical Director for [facility] Pulmonary Rehabilitation. ?

## 2022-01-29 ENCOUNTER — Ambulatory Visit (HOSPITAL_COMMUNITY): Payer: Self-pay | Admitting: Pharmacist

## 2022-01-29 ENCOUNTER — Encounter: Payer: Medicare HMO | Admitting: *Deleted

## 2022-01-29 DIAGNOSIS — I5022 Chronic systolic (congestive) heart failure: Secondary | ICD-10-CM

## 2022-01-29 DIAGNOSIS — Z95811 Presence of heart assist device: Secondary | ICD-10-CM

## 2022-01-29 LAB — POCT INR
INR: 1.9 — AB (ref 2.0–3.0)
INR: 2.2 (ref 2.0–3.0)

## 2022-01-29 NOTE — Progress Notes (Signed)
Daily Session Note ? ?Patient Details  ?Name: Isaac [name] ?MRN: [id number] ?Date of Birth: [birth year] ?Referring Provider:   ?Flowsheet Row Cardiac Rehab from 01/06/2022 in [facility] Cardiac and Pulmonary Rehab  ?Referring Provider Loralie Champagne MD  ? ?  ? ? ?Encounter Date: 01/29/2022 ? ?Check In: ? Session Check In - 01/29/22 0851   ? ?  ? Check-In  ? Supervising physician immediately available to respond to emergencies See telemetry face sheet for immediately available ER MD   ? Location [facility]-Cardiac & Pulmonary Rehab   ? Staff Present Heath Lark, RN, BSN, CCRP;Melissa [place], RDN, LDN;Joseph [place], [place]   ? Virtual Visit No   ? Medication changes reported     No   ? Fall or balance concerns reported    No   ? Warm-up and Cool-down Performed on first and last piece of equipment   ? Resistance Training Performed Yes   ? VAD Patient? Yes   ? PAD/SET Patient? No   ?  ? VAD patient  ? Has back up controller? Yes   ? Has spare charged batteries? Yes   ? Has battery cables? Yes   ? Has compatible battery clips? Yes   ?  ? Pain Assessment  ? Currently in Pain? No/denies   ? ?  ?  ? ?  ? ? ? ? ? ?Social History  ? ?Tobacco Use  ?Smoking Status Former  ?Smokeless Tobacco Never  ? ? ?Goals Met:  ?Independence with exercise equipment ?Exercise tolerated well ?No report of concerns or symptoms today ? ?Goals Unmet:  ?Not Applicable ? ?Comments: Pt able to follow exercise prescription today without complaint.  Will continue to monitor for progression. ? ? ? ?Dr. Emily Filbert is Medical Director for [place].  ?Dr. Ottie Glazier is Medical Director for [facility] Pulmonary Rehabilitation. ?

## 2022-01-29 NOTE — Progress Notes (Signed)
LVAD INR 

## 2022-02-02 ENCOUNTER — Ambulatory Visit (HOSPITAL_COMMUNITY)
Admission: RE | Admit: 2022-02-02 | Discharge: 2022-02-02 | Disposition: A | Discharge status: Home / Self Care | Payer: Medicare HMO | Source: Ambulatory Visit | Attending: Internal Medicine | Admitting: Internal Medicine

## 2022-02-02 DIAGNOSIS — Z4800 Encounter for change or removal of nonsurgical wound dressing: Secondary | ICD-10-CM | POA: Diagnosis present

## 2022-02-02 DIAGNOSIS — Z95811 Presence of heart assist device: Secondary | ICD-10-CM | POA: Diagnosis not present

## 2022-02-02 MED ORDER — FLUCONAZOLE 200 MG PO TABS
200.0000 mg | ORAL_TABLET | Freq: Every day | ORAL | 0 refills | Status: AC
Start: 2022-02-02 — End: 2022-02-05

## 2022-02-02 MED ORDER — WARFARIN SODIUM 2 MG PO TABS: ORAL_TABLET | ORAL | 11 refills | Status: DC

## 2022-02-02 NOTE — Progress Notes (Signed)
Pts wife called the office stating that his driveline has a rash and is painful. Pt presents to clinic alone. ? ?Patient presents to clinic today for drive line exit wound care. Existing VAD dressing removed and site care performed using sterile technique. Drive line exit site cleaned with saline only, allowed to dry, and gauze dressing only applied w/silk tape. Pt has large area of yeast rash. Area around driveline and surrounding is very tender. Exit site healed and incorporated, the velour is fully implanted at exit site. Scant yellow drainage no foul odor. Drive line anchor re-applied over the silk tape. Pt denies fever or chills.  ? ? ? ?D/w DR Aundra Dubin and Audry Riles pharm-D will start Diflucan for 3 days. Other changes below. ?  ?Plan: ?Start Diflucan 1 tablet daily for 3 days. This medication affects your INR. Please check INR on Wednesday this week. ?Start using the daily kit with no silver strip, cleaning with saline only, NO CHG, please use the silk tape provided. ?Return to clinic at regular schedule visit. Please call the office if rash does not improve. ?

## 2022-02-02 NOTE — Patient Instructions (Signed)
Start Diflucan 1 tablet daily for 3 days. This medication affects your INR. Please check INR on Wednesday this week. ?Start using the daily kit with no silver strip, cleaning with saline only, NO CHG, please use the silk tape provided. ?Return to clinic at regular schedule visit. Please call the office if rash does not improve. ?

## 2022-02-03 DIAGNOSIS — I5022 Chronic systolic (congestive) heart failure: Secondary | ICD-10-CM | POA: Diagnosis not present

## 2022-02-03 DIAGNOSIS — Z95811 Presence of heart assist device: Secondary | ICD-10-CM

## 2022-02-03 NOTE — Progress Notes (Signed)
Daily Session Note ? ?Patient Details  ?Name: Isaac [name] ?MRN: [id number] ?Date of Birth: [birth year] ?Referring Provider:   ?Flowsheet Row Cardiac Rehab from 01/06/2022 in [facility] Cardiac and Pulmonary Rehab  ?Referring Provider Loralie Champagne MD  ? ?  ? ? ?Encounter Date: 02/03/2022 ? ?Check In: ? Session Check In - 02/03/22 0800   ? ?  ? Check-In  ? Supervising physician immediately available to respond to emergencies See telemetry face sheet for immediately available ER MD   ? Location [facility]-Cardiac & Pulmonary Rehab   ? Staff Present Birdie Sons, MPA, RN;Jessica [place], MA, RCEP, CCRP, [place], BS, ACSM CEP, Exercise Physiologist   ? Virtual Visit No   ? Medication changes reported     No   ? Fall or balance concerns reported    No   ? Warm-up and Cool-down Performed on first and last piece of equipment   ? Resistance Training Performed Yes   ? VAD Patient? Yes   ? PAD/SET Patient? No   ?  ? VAD patient  ? Has back up controller? Yes   ? Has spare charged batteries? Yes   ? Has battery cables? Yes   ? Has compatible battery clips? Yes   ?  ? Pain Assessment  ? Currently in Pain? No/denies   ? ?  ?  ? ?  ? ? ? ? ? ?Social History  ? ?Tobacco Use  ?Smoking Status Former  ?Smokeless Tobacco Never  ? ? ?Goals Met:  ?Independence with exercise equipment ?Exercise tolerated well ?No report of concerns or symptoms today ?Strength training completed today ? ?Goals Unmet:  ?Not Applicable ? ?Comments: Pt able to follow exercise prescription today without complaint.  Will continue to monitor for progression. ? ? ? ?Dr. Emily Filbert is Medical Director for [place].  ?Dr. Ottie Glazier is Medical Director for [facility] Pulmonary Rehabilitation. ?

## 2022-02-04 ENCOUNTER — Encounter: Payer: Self-pay | Admitting: *Deleted

## 2022-02-04 DIAGNOSIS — Z95811 Presence of heart assist device: Secondary | ICD-10-CM

## 2022-02-04 DIAGNOSIS — I5022 Chronic systolic (congestive) heart failure: Secondary | ICD-10-CM

## 2022-02-04 NOTE — Progress Notes (Signed)
Cardiac Individual Treatment Plan ? ?Patient Details  ?Name: Isaac [name] ?MRN: [id number] ?Date of Birth: [birth year] ?Referring Provider:   ?Flowsheet Row Cardiac Rehab from 01/06/2022 in [facility] Cardiac and Pulmonary Rehab  ?Referring Provider Loralie Champagne MD  ? ?  ? ? ?Initial Encounter Date:  ?Flowsheet Row Cardiac Rehab from 01/06/2022 in [facility] Cardiac and Pulmonary Rehab  ?Date 01/06/22  ? ?  ? ? ?Visit Diagnosis: Heart failure, chronic systolic (HCC) ? ?LVAD (left ventricular assist device) present ([place]) ? ?Patient's Home Medications on Admission: ? ?Current Outpatient Medications:  ?  albuterol (VENTOLIN HFA) 108 (90 Base) MCG/ACT inhaler, Inhale 2 puffs into the lungs every 6 (six) hours as needed. (Patient not taking: Reported on 12/09/2021), Disp: , Rfl:  ?  amiodarone (PACERONE) 200 MG tablet, Take 0.5 tablets (100 mg total) by mouth daily., Disp: 30 tablet, Rfl: 6 ?  fluconazole (DIFLUCAN) 200 MG tablet, Take 1 tablet (200 mg total) by mouth daily for 3 days., Disp: 3 tablet, Rfl: 0 ?  midodrine (PROAMATINE) 10 MG tablet, Take 1 tablet (10 mg total) by mouth 3 (three) times a week. Take 10 mg (1 tablet) Monday, Wednesday, Friday 30 minutes before hemodialysis session, Disp: 60 tablet, Rfl: 2 ?  mirtazapine (REMERON) 7.5 MG tablet, Take 1 tablet (7.5 mg total) by mouth at bedtime., Disp: 30 tablet, Rfl: 6 ?  Multiple Vitamins-Minerals (CERTAVITE/ANTIOXIDANTS) TABS, Take 1 tablet by mouth at bedtime., Disp: 30 tablet, Rfl: 11 ?  ondansetron (ZOFRAN) 4 MG tablet, Take 1 tablet (4 mg total) by mouth every 8 (eight) hours as needed for nausea or vomiting. (Patient not taking: Reported on 01/20/2022), Disp: 40 tablet, Rfl: 3 ?  pantoprazole (PROTONIX) 40 MG tablet, Take 1 tablet (40 mg total) by mouth daily., Disp: 30 tablet, Rfl: 6 ?  rosuvastatin (CRESTOR) 10 MG tablet, Take 1 tablet (10 mg total) by mouth daily., Disp: 30 tablet, Rfl: 6 ?  sildenafil (REVATIO) 20 MG tablet, Take 1 tablet (20 mg total) by  mouth 3 (three) times daily., Disp: 90 tablet, Rfl: 6 ?  warfarin (COUMADIN) 2 MG tablet, Take 4 mg (2 tabs) every Monday/Wednesday/Friday and 2 mg (1 tab) all other days or as directed by the [facility]., Disp: 90 tablet, Rfl: 11 ? ?Past Medical History: ?Past Medical History:  ?Diagnosis Date  ? Arrhythmia   ? atrial fibrillation  ? CHF (congestive heart failure) ([place])   ? Chronic kidney disease   ? Coronary artery disease   ? Hyperlipidemia   ? Hypertension   ? Myocardial infarct [place])   ? ? ?Tobacco Use: ?Social History  ? ?Tobacco Use  ?Smoking Status Former  ?Smokeless Tobacco Never  ? ? ?Labs: ?Review Flowsheet   ? ?  ?  Latest Ref Rng & Units 10/18/2021 10/19/2021 10/20/2021 10/21/2021  ?Labs for ITP Cardiac and Pulmonary Rehab  ?O2 Saturation % 54.2   50.9   57.5   58.5    ? ?  10/22/2021  ?Labs for ITP Cardiac and Pulmonary Rehab  ?O2 Saturation 86.5    ?  ?  ?  ? ? ? ?Exercise Target Goals: ?Exercise Program Goal: ?Individual exercise prescription set using results from initial 6 min walk test and THRR while considering  patient?s activity barriers and safety.  ? ?Exercise Prescription Goal: ?Initial exercise prescription builds to 30-45 minutes a day of aerobic activity, 2-3 days per week.  Home exercise guidelines will be given to patient during program as part of exercise  prescription that the participant will acknowledge. ? ? ?Education: Aerobic Exercise: ?- Group verbal and visual presentation on the components of exercise prescription. Introduces F.I.T.T principle from ACSM for exercise prescriptions.  Reviews F.I.T.T. principles of aerobic exercise including progression. Written material given at graduation. ? ? ?Education: Resistance Exercise: ?- Group verbal and visual presentation on the components of exercise prescription. Introduces F.I.T.T principle from ACSM for exercise prescriptions  Reviews F.I.T.T. principles of resistance exercise including progression. Written material given at  graduation. ?Flowsheet Row Cardiac Rehab from 01/29/2022 in [facility] Cardiac and Pulmonary Rehab  ?Date 01/29/22  ?Educator [facility]  ?Instruction Review Code 1- Verbalizes Understanding  ? ?  ? ?  ?Education: Exercise & Equipment Safety: ?- Individual verbal instruction and demonstration of equipment use and safety with use of the equipment. ?Flowsheet Row Cardiac Rehab from 01/29/2022 in [facility] Cardiac and Pulmonary Rehab  ?Education need identified 01/06/22  ?Date 01/06/22  ?Educator [facility]  ?Instruction Review Code 2- Demonstrated Understanding  ? ?  ? ? ?Education: Exercise Physiology & General Exercise Guidelines: ?- Group verbal and written instruction with models to review the exercise physiology of the cardiovascular system and associated critical values. Provides general exercise guidelines with specific guidelines to those with heart or lung disease.  ?Flowsheet Row Cardiac Rehab from 01/29/2022 in [facility] Cardiac and Pulmonary Rehab  ?Education need identified 01/06/22  ?Date 01/15/22  ?Educator [facility]  ?Instruction Review Code 1- Verbalizes Understanding  ? ?  ? ? ?Education: Flexibility, Balance, Mind/Body Relaxation: ?- Group verbal and visual presentation with interactive activity on the components of exercise prescription. Introduces F.I.T.T principle from ACSM for exercise prescriptions. Reviews F.I.T.T. principles of flexibility and balance exercise training including progression. Also discusses the mind body connection.  Reviews various relaxation techniques to help reduce and manage stress (i.e. Deep breathing, progressive muscle relaxation, and visualization). Balance handout provided to take home. Written material given at graduation. ? ? ?Activity Barriers & Risk Stratification: ? Activity Barriers & Cardiac Risk Stratification - 01/06/22 1515   ? ?  ? Activity Barriers & Cardiac Risk Stratification  ? Activity Barriers Joint Problems;Muscular Weakness;Decreased Ventricular Function;Other (comment)   ? Comments Right  hand weakness, LVAD   ? Cardiac Risk Stratification High   ? ?  ?  ? ?  ? ? ?6 Minute Walk: ? 6 Minute Walk   ? ? [place] Name 01/06/22 1516  ?  ?  ?  ? 6 Minute Walk  ? Phase Initial    ? Distance 1435 feet    ? Walk Time 6 minutes    ? # of Rest Breaks 0    ? MPH 2.71    ? METS 3.21    ? RPE 10    ? Perceived Dyspnea  0    ? VO2 Peak 11.26    ? Symptoms No    ? Resting HR 71 bpm    ? Resting BP --  84- doppler    ? Resting Oxygen Saturation  99 %    ? Exercise Oxygen Saturation  during 6 min walk 99 %    ? Max Ex. HR 93 bpm    ? Max Ex. BP --  104- doppler    ? 2 Minute Post BP --  94-doppler    ? ?  ?  ? ?  ? ? ?Oxygen Initial Assessment: ? ? ?Oxygen Re-Evaluation: ? ? ?Oxygen Discharge (Final Oxygen Re-Evaluation): ? ? ?Initial Exercise Prescription: ? Initial Exercise Prescription - 01/06/22 1500   ? ?  ?  Date of Initial Exercise RX and Referring Provider  ? Date 01/06/22   ? Referring Provider Loralie Champagne MD   ?  ? Oxygen  ? Maintain Oxygen Saturation 88% or higher   ?  ? REL-XR  ? Level 2   ? Speed 50   ? Minutes 15   ? METs 3.2   ?  ? Biostep-RELP  ? Level 2   ? SPM 50   ? Minutes 15   ? METs 3.2   ?  ? Track  ? Laps 34   ? Minutes 15   ? METs 2.85   ?  ? Prescription Details  ? Frequency (times per week) 2   ? Duration Progress to 30 minutes of continuous aerobic without signs/symptoms of physical distress   ?  ? Intensity  ? THRR 40-80% of Max Heartrate 103- 135   ? Ratings of Perceived Exertion 11-13   ? Perceived Dyspnea 0-4   ?  ? Progression  ? Progression Continue to progress workloads to maintain intensity without signs/symptoms of physical distress.   ?  ? Resistance Training  ? Training Prescription Yes   ? Weight 3 lb   ? Reps 10-15   ? ?  ?  ? ?  ? ? ?Perform Capillary Blood Glucose checks as needed. ? ?Exercise Prescription Changes: ? ? Exercise Prescription Changes   ? ? [place] Name 01/06/22 1500 01/27/22 0800 01/27/22 1500  ?  ?  ?  ? Response to Exercise  ? Blood Pressure (Admit) --  84- doppler  -- --  88 dopplar    ? Blood Pressure (Exercise) --  104- doppler -- --    ? Blood Pressure (Exit) --  94 - doppler -- --  84 dopplar    ? Heart Rate (Admit) 71 bpm -- 68 bpm    ? Heart Rate (Exercise) 93 bpm

## 2022-02-05 ENCOUNTER — Ambulatory Visit (HOSPITAL_COMMUNITY): Payer: Self-pay | Admitting: Pharmacist

## 2022-02-05 DIAGNOSIS — I5022 Chronic systolic (congestive) heart failure: Secondary | ICD-10-CM | POA: Diagnosis not present

## 2022-02-05 DIAGNOSIS — Z95811 Presence of heart assist device: Secondary | ICD-10-CM

## 2022-02-05 NOTE — Progress Notes (Signed)
Completed initial RD consultation ?

## 2022-02-05 NOTE — Progress Notes (Signed)
Daily Session Note  Patient Details  Name: Isaac [name] MRN: [id number] Date of Birth: 1950-10-12 Referring Provider:   Flowsheet Row Cardiac Rehab from 01/06/2022 in [facility] Cardiac and Pulmonary Rehab  Referring Provider Loralie Champagne MD       Encounter Date: 02/05/2022  Check In:  Session Check In - 02/05/22 0738       Check-In   Supervising physician immediately available to respond to emergencies See telemetry face sheet for immediately available ER MD    Location [facility]-Cardiac & Pulmonary Rehab    Staff Present Birdie Sons, MPA, RN;Melissa [place], RDN, LDN;Jessica Luan Pulling, MA, RCEP, CCRP, CCET    Virtual Visit No    Medication changes reported     No    Fall or balance concerns reported    No    Warm-up and Cool-down Performed on first and last piece of equipment    Resistance Training Performed Yes    VAD Patient? Yes    PAD/SET Patient? No      VAD patient   Has back up controller? Yes    Has spare charged batteries? Yes    Has battery cables? Yes    Has compatible battery clips? Yes      Pain Assessment   Currently in Pain? No/denies                Social History   Tobacco Use  Smoking Status Former  Smokeless Tobacco Never    Goals Met:  Independence with exercise equipment Exercise tolerated well No report of concerns or symptoms today Strength training completed today  Goals Unmet:  Not Applicable  Comments: Pt able to follow exercise prescription today without complaint.  Will continue to monitor for progression.    Dr. Emily Filbert is Medical Director for [place].  Dr. Ottie Glazier is Medical Director for [facility] Pulmonary Rehabilitation.

## 2022-02-05 NOTE — Progress Notes (Signed)
LVAD INR 

## 2022-02-10 DIAGNOSIS — Z95811 Presence of heart assist device: Secondary | ICD-10-CM

## 2022-02-10 DIAGNOSIS — I5022 Chronic systolic (congestive) heart failure: Secondary | ICD-10-CM

## 2022-02-10 NOTE — Progress Notes (Signed)
Daily Session Note  Patient Details  Name: Isaac [name] MRN: [id number] Date of Birth: 06-07-1951 Referring Provider:   Flowsheet Row Cardiac Rehab from 01/06/2022 in [facility] Cardiac and Pulmonary Rehab  Referring Provider Loralie Champagne MD       Encounter Date: 02/10/2022  Check In:  Session Check In - 02/10/22 0744       Check-In   Supervising physician immediately available to respond to emergencies See telemetry face sheet for immediately available ER MD    Location [facility]-Cardiac & Pulmonary Rehab    Staff Present Birdie Sons, MPA, RN;Jessica [place], MA, RCEP, CCRP, Rosalio Macadamia, BS, ACSM CEP, Exercise Physiologist    Virtual Visit No    Medication changes reported     No    Fall or balance concerns reported    No    Warm-up and Cool-down Performed on first and last piece of equipment    Resistance Training Performed Yes    VAD Patient? Yes    PAD/SET Patient? No      VAD patient   Has back up controller? Yes    Has spare charged batteries? Yes    Has battery cables? Yes    Has compatible battery clips? Yes      Pain Assessment   Currently in Pain? No/denies                Social History   Tobacco Use  Smoking Status Former  Smokeless Tobacco Never    Goals Met:  Independence with exercise equipment Exercise tolerated well No report of concerns or symptoms today Strength training completed today  Goals Unmet:  Not Applicable  Comments: Pt able to follow exercise prescription today without complaint.  Will continue to monitor for progression.    Dr. Emily Filbert is Medical Director for [place].  Dr. Ottie Glazier is Medical Director for [facility] Pulmonary Rehabilitation.

## 2022-02-12 ENCOUNTER — Encounter: Payer: Medicare HMO | Admitting: *Deleted

## 2022-02-12 ENCOUNTER — Ambulatory Visit (HOSPITAL_COMMUNITY): Payer: Self-pay | Admitting: Pharmacist

## 2022-02-12 DIAGNOSIS — I5022 Chronic systolic (congestive) heart failure: Secondary | ICD-10-CM

## 2022-02-12 DIAGNOSIS — Z95811 Presence of heart assist device: Secondary | ICD-10-CM

## 2022-02-12 LAB — POCT INR: INR: 1.6 — AB (ref 2.0–3.0)

## 2022-02-12 NOTE — Progress Notes (Signed)
LVAD INR 

## 2022-02-12 NOTE — Progress Notes (Signed)
Daily Session Note  Patient Details  Name: Isaac [name] MRN: [id number] Date of Birth: 17-Apr-1951 Referring Provider:   Flowsheet Row Cardiac Rehab from 01/06/2022 in [facility] Cardiac and Pulmonary Rehab  Referring Provider Loralie Champagne MD       Encounter Date: 02/12/2022  Check In:  Session Check In - 02/12/22 0814       Check-In   Supervising physician immediately available to respond to emergencies See telemetry face sheet for immediately available ER MD    Location [facility]-Cardiac & Pulmonary Rehab    Staff Present Heath Lark, RN, BSN, CCRP;Laureen Owens Shark, BS, RRT, CPFT;Kara Eliezer [place], MS, ASCM CEP, Exercise Physiologist    Virtual Visit No    Medication changes reported     No    Fall or balance concerns reported    No    Warm-up and Cool-down Performed on first and last piece of equipment    Resistance Training Performed Yes    VAD Patient? Yes    PAD/SET Patient? No      VAD patient   Has back up controller? Yes    Has spare charged batteries? Yes    Has battery cables? Yes    Has compatible battery clips? Yes      Pain Assessment   Currently in Pain? No/denies                Social History   Tobacco Use  Smoking Status Former  Smokeless Tobacco Never    Goals Met:  Independence with exercise equipment Exercise tolerated well No report of concerns or symptoms today  Goals Unmet:  Not Applicable  Comments: Pt able to follow exercise prescription today without complaint.  Will continue to monitor for progression.    Dr. Emily Filbert is Medical Director for [place].  Dr. Ottie Glazier is Medical Director for [facility] Pulmonary Rehabilitation.

## 2022-02-17 DIAGNOSIS — Z95811 Presence of heart assist device: Secondary | ICD-10-CM

## 2022-02-17 DIAGNOSIS — I5022 Chronic systolic (congestive) heart failure: Secondary | ICD-10-CM

## 2022-02-17 NOTE — Progress Notes (Signed)
Daily Session Note  Patient Details  Name: Isaac [name] MRN: [id number] Date of Birth: [birth year] Referring Provider:   Flowsheet Row Cardiac Rehab from 01/06/2022 in [facility] Cardiac and Pulmonary Rehab  Referring Provider Loralie Champagne MD       Encounter Date: 02/17/2022  Check In:  Session Check In - 02/17/22 0749       Check-In   Supervising physician immediately available to respond to emergencies See telemetry face sheet for immediately available ER MD    Location [facility]-Cardiac & Pulmonary Rehab    Staff Present Birdie Sons, MPA, RN;Jessica Luan [place], MA, RCEP, CCRP, Rosalio Macadamia, BS, ACSM CEP, Exercise Physiologist    Virtual Visit No    Medication changes reported     No    Fall or balance concerns reported    No    Warm-up and Cool-down Performed on first and last piece of equipment    Resistance Training Performed Yes    VAD Patient? Yes    PAD/SET Patient? No      VAD patient   Has back up controller? Yes    Has spare charged batteries? Yes    Has battery cables? Yes    Has compatible battery clips? Yes      Pain Assessment   Currently in Pain? No/denies                Social History   Tobacco Use  Smoking Status Former  Smokeless Tobacco Never    Goals Met:  Independence with exercise equipment Exercise tolerated well Personal goals reviewed No report of concerns or symptoms today Strength training completed today  Goals Unmet:  Not Applicable  Comments: Pt able to follow exercise prescription today without complaint.  Will continue to monitor for progression.    Dr. Emily Filbert is Medical Director for [place].  Dr. Ottie Glazier is Medical Director for [facility] Pulmonary Rehabilitation.

## 2022-02-19 ENCOUNTER — Encounter: Payer: Medicare HMO | Attending: Cardiology

## 2022-02-19 DIAGNOSIS — Z95811 Presence of heart assist device: Secondary | ICD-10-CM

## 2022-02-19 DIAGNOSIS — I5022 Chronic systolic (congestive) heart failure: Secondary | ICD-10-CM | POA: Diagnosis present

## 2022-02-19 NOTE — Progress Notes (Signed)
Daily Session Note  Patient Details  Name: Isaac [name] MRN: [id number] Date of Birth: [birth year] Referring Provider:   Flowsheet Row Cardiac Rehab from 01/06/2022 in [facility] Cardiac and Pulmonary Rehab  Referring Provider Loralie Champagne MD       Encounter Date: 02/19/2022  Check In:  Session Check In - 02/19/22 0801       Check-In   Supervising physician immediately available to respond to emergencies See telemetry face sheet for immediately available ER MD    Location [facility]-Cardiac & Pulmonary Rehab    Staff Present Birdie Sons, MPA, Nino Glow, MS, ASCM CEP, Exercise Physiologist;Laureen Owens Shark, BS, RRT, CPFT    Virtual Visit No    Medication changes reported     No    Fall or balance concerns reported    No    Warm-up and Cool-down Performed on first and last piece of equipment    Resistance Training Performed Yes    VAD Patient? Yes    PAD/SET Patient? No      VAD patient   Has back up controller? Yes    Has spare charged batteries? Yes    Has battery cables? Yes    Has compatible battery clips? Yes      Pain Assessment   Currently in Pain? No/denies                Social History   Tobacco Use  Smoking Status Former  Smokeless Tobacco Never    Goals Met:  Independence with exercise equipment Exercise tolerated well No report of concerns or symptoms today Strength training completed today  Goals Unmet:  Not Applicable  Comments: Pt able to follow exercise prescription today without complaint.  Will continue to monitor for progression.    Dr. Emily Filbert is Medical Director for [place].  Dr. Ottie Glazier is Medical Director for [facility] Pulmonary Rehabilitation.

## 2022-02-20 ENCOUNTER — Ambulatory Visit (HOSPITAL_COMMUNITY): Payer: Self-pay | Admitting: Pharmacist

## 2022-02-20 LAB — POCT INR: INR: 1.6 — AB (ref 2.0–3.0)

## 2022-02-20 NOTE — Progress Notes (Signed)
LVAD INR 

## 2022-02-24 DIAGNOSIS — I5022 Chronic systolic (congestive) heart failure: Secondary | ICD-10-CM

## 2022-02-24 DIAGNOSIS — Z95811 Presence of heart assist device: Secondary | ICD-10-CM

## 2022-02-24 NOTE — Progress Notes (Signed)
Daily Session Note  Patient Details  Name: Isaac [name] MRN: [id number] Date of Birth: 1951-09-08 Referring Provider:   Flowsheet Row Cardiac Rehab from 01/06/2022 in [facility] Cardiac and Pulmonary Rehab  Referring Provider Loralie Champagne MD       Encounter Date: 02/24/2022  Check In:  Session Check In - 02/24/22 0750       Check-In   Supervising physician immediately available to respond to emergencies See telemetry face sheet for immediately available ER MD    Location [facility]-Cardiac & Pulmonary Rehab    Staff Present Alberteen Sam, MA, RCEP, CCRP, CCET;Lailani Tool [place], MPA, Mauricia [place], BS, ACSM CEP, Exercise Physiologist    Virtual Visit No    Medication changes reported     No    Fall or balance concerns reported    No    Warm-up and Cool-down Performed on first and last piece of equipment    Resistance Training Performed Yes    VAD Patient? Yes    PAD/SET Patient? No      VAD patient   Has back up controller? Yes    Has spare charged batteries? Yes    Has battery cables? Yes    Has compatible battery clips? Yes      Pain Assessment   Currently in Pain? No/denies                Social History   Tobacco Use  Smoking Status Former  Smokeless Tobacco Never    Goals Met:  Independence with exercise equipment Exercise tolerated well No report of concerns or symptoms today Strength training completed today  Goals Unmet:  Not Applicable  Comments: Pt able to follow exercise prescription today without complaint.  Will continue to monitor for progression.    Dr. Emily Filbert is Medical Director for [place].  Dr. Ottie Glazier is Medical Director for [facility] Pulmonary Rehabilitation.

## 2022-02-26 ENCOUNTER — Ambulatory Visit (HOSPITAL_COMMUNITY): Payer: Self-pay | Admitting: Pharmacist

## 2022-02-26 DIAGNOSIS — Z95811 Presence of heart assist device: Secondary | ICD-10-CM

## 2022-02-26 DIAGNOSIS — I5022 Chronic systolic (congestive) heart failure: Secondary | ICD-10-CM

## 2022-02-26 LAB — POCT INR: INR: 2.1 (ref 2.0–3.0)

## 2022-02-26 NOTE — Progress Notes (Signed)
Daily Session Note  Patient Details  Name: Isaac [name] MRN: [id number] Date of Birth: 30-Oct-1950 Referring Provider:   Flowsheet Row Cardiac Rehab from 01/06/2022 in [facility] Cardiac and Pulmonary Rehab  Referring Provider Loralie Champagne MD       Encounter Date: 02/26/2022  Check In:  Session Check In - 02/26/22 0810       Check-In   Supervising physician immediately available to respond to emergencies See telemetry face sheet for immediately available ER MD    Location [facility]-Cardiac & Pulmonary Rehab    Staff Present Justin Mend, Sharren [place], MS, ASCM CEP, Exercise Physiologist;Nitin Mckowen Rosalia Hammers, MPA, RN    Virtual Visit No    Medication changes reported     No    Fall or balance concerns reported    No    Warm-up and Cool-down Performed on first and last piece of equipment    Resistance Training Performed Yes    VAD Patient? Yes    PAD/SET Patient? No      VAD patient   Has back up controller? Yes    Has spare charged batteries? Yes    Has battery cables? Yes    Has compatible battery clips? Yes      Pain Assessment   Currently in Pain? No/denies                Social History   Tobacco Use  Smoking Status Former  Smokeless Tobacco Never    Goals Met:  Independence with exercise equipment Exercise tolerated well No report of concerns or symptoms today Strength training completed today  Goals Unmet:  Not Applicable  Comments: Pt able to follow exercise prescription today without complaint.  Will continue to monitor for progression.    Dr. Emily Filbert is Medical Director for [place].  Dr. Ottie Glazier is Medical Director for [facility] Pulmonary Rehabilitation.

## 2022-02-26 NOTE — Progress Notes (Signed)
LVAD INR 

## 2022-03-03 ENCOUNTER — Encounter: Payer: Medicare HMO | Admitting: *Deleted

## 2022-03-03 ENCOUNTER — Ambulatory Visit (INDEPENDENT_AMBULATORY_CARE_PROVIDER_SITE_OTHER): Payer: Medicare HMO

## 2022-03-03 DIAGNOSIS — I5022 Chronic systolic (congestive) heart failure: Secondary | ICD-10-CM

## 2022-03-03 DIAGNOSIS — Z95811 Presence of heart assist device: Secondary | ICD-10-CM

## 2022-03-03 DIAGNOSIS — I429 Cardiomyopathy, unspecified: Secondary | ICD-10-CM | POA: Diagnosis not present

## 2022-03-03 NOTE — Progress Notes (Signed)
Daily Session Note  Patient Details  Name: Isaac [name] MRN: [id number] Date of Birth: [birth year] Referring Provider:   Flowsheet Row Cardiac Rehab from 01/06/2022 in [facility] Cardiac and Pulmonary Rehab  Referring Provider Loralie Champagne MD       Encounter Date: 03/03/2022  Check In:  Session Check In - 03/03/22 0743       Check-In   Supervising physician immediately available to respond to emergencies See telemetry face sheet for immediately available ER MD    Location [facility]-Cardiac & Pulmonary Rehab    Staff Present Earlean Shawl, BS, ACSM CEP, Exercise Physiologist;Susanne Bice, RN, BSN, CCRP;Melissa [place], RDN, LDN;Maraya Gwilliam [place], MA, RCEP, CCRP, CCET    Virtual Visit No    Medication changes reported     No    Fall or balance concerns reported    No    Warm-up and Cool-down Performed on first and last piece of equipment    Resistance Training Performed Yes    VAD Patient? Yes    PAD/SET Patient? No      VAD patient   Has back up controller? Yes    Has spare charged batteries? Yes    Has battery cables? Yes    Has compatible battery clips? Yes      Pain Assessment   Currently in Pain? No/denies                Social History   Tobacco Use  Smoking Status Former  Smokeless Tobacco Never    Goals Met:  Independence with exercise equipment Exercise tolerated well No report of concerns or symptoms today Strength training completed today  Goals Unmet:  Not Applicable  Comments: Pt able to follow exercise prescription today without complaint.  Will continue to monitor for progression.    Dr. Emily Filbert is Medical Director for [place].  Dr. Ottie Glazier is Medical Director for [facility] Pulmonary Rehabilitation.

## 2022-03-04 ENCOUNTER — Encounter: Payer: Self-pay | Admitting: *Deleted

## 2022-03-04 DIAGNOSIS — I5022 Chronic systolic (congestive) heart failure: Secondary | ICD-10-CM

## 2022-03-04 DIAGNOSIS — Z95811 Presence of heart assist device: Secondary | ICD-10-CM

## 2022-03-04 NOTE — Progress Notes (Signed)
Cardiac Individual Treatment Plan  Patient Details  Name: Isaac [name] MRN: [id number] Date of Birth: [birth year] Referring Provider:   Flowsheet Row Cardiac Rehab from 01/06/2022 in [facility] Cardiac and Pulmonary Rehab  Referring Provider Loralie Champagne MD       Initial Encounter Date:  Flowsheet Row Cardiac Rehab from 01/06/2022 in [facility] Cardiac and Pulmonary Rehab  Date 01/06/22       Visit Diagnosis: Heart failure, chronic systolic ([place])  LVAD (left ventricular assist device) present ([place])  Patient's Home Medications on Admission:  Current Outpatient Medications:    albuterol (VENTOLIN HFA) 108 (90 Base) MCG/ACT inhaler, Inhale 2 puffs into the lungs every 6 (six) hours as needed. (Patient not taking: Reported on 12/09/2021), Disp: , Rfl:    amiodarone (PACERONE) 200 MG tablet, Take 0.5 tablets (100 mg total) by mouth daily., Disp: 30 tablet, Rfl: 6   midodrine (PROAMATINE) 10 MG tablet, Take 1 tablet (10 mg total) by mouth 3 (three) times a week. Take 10 mg (1 tablet) Monday, Wednesday, Friday 30 minutes before hemodialysis session, Disp: 60 tablet, Rfl: 2   mirtazapine (REMERON) 7.5 MG tablet, Take 1 tablet (7.5 mg total) by mouth at bedtime., Disp: 30 tablet, Rfl: 6   Multiple Vitamins-Minerals (CERTAVITE/ANTIOXIDANTS) TABS, Take 1 tablet by mouth at bedtime., Disp: 30 tablet, Rfl: 11   ondansetron (ZOFRAN) 4 MG tablet, Take 1 tablet (4 mg total) by mouth every 8 (eight) hours as needed for nausea or vomiting. (Patient not taking: Reported on 01/20/2022), Disp: 40 tablet, Rfl: 3   pantoprazole (PROTONIX) 40 MG tablet, Take 1 tablet (40 mg total) by mouth daily., Disp: 30 tablet, Rfl: 6   rosuvastatin (CRESTOR) 10 MG tablet, Take 1 tablet (10 mg total) by mouth daily., Disp: 30 tablet, Rfl: 6   sildenafil (REVATIO) 20 MG tablet, Take 1 tablet (20 mg total) by mouth 3 (three) times daily., Disp: 90 tablet, Rfl: 6   warfarin (COUMADIN) 2 MG tablet, Take 4 mg (2 tabs) every  Monday/Wednesday/Friday and 2 mg (1 tab) all other days or as directed by the [facility]., Disp: 90 tablet, Rfl: 11  Past Medical History: Past Medical History:  Diagnosis Date   Arrhythmia    atrial fibrillation   CHF (congestive heart failure) ([place])    Chronic kidney disease    Coronary artery disease    Hyperlipidemia    Hypertension    Myocardial infarct ([place])     Tobacco Use: Social History   Tobacco Use  Smoking Status Former  Smokeless Tobacco Never    Labs: Review Flowsheet  More data exists      Latest Ref Rng & Units 10/18/2021 10/19/2021 10/20/2021 10/21/2021  Labs for ITP Cardiac and Pulmonary Rehab  O2 Saturation % 54.2  50.9  57.5  58.5       10/22/2021  Labs for ITP Cardiac and Pulmonary Rehab  O2 Saturation 86.5      Exercise Target Goals: Exercise Program Goal: Individual exercise prescription set using results from initial 6 min walk test and THRR while considering  patient's activity barriers and safety.   Exercise Prescription Goal: Initial exercise prescription builds to 30-45 minutes a day of aerobic activity, 2-3 days per week.  Home exercise guidelines will be given to patient during program as part of exercise prescription that the participant will acknowledge.   Education: Aerobic Exercise: - Group verbal and visual presentation on the components of exercise prescription. Introduces F.I.T.T principle from ACSM for exercise prescriptions.  Reviews F.I.T.T. principles of aerobic exercise including progression. Written material given at graduation.   Education: Resistance Exercise: - Group verbal and visual presentation on the components of exercise prescription. Introduces F.I.T.T principle from ACSM for exercise prescriptions  Reviews F.I.T.T. principles of resistance exercise including progression. Written material given at graduation. Flowsheet Row Cardiac Rehab from 02/26/2022 in [facility] Cardiac and Pulmonary Rehab  Date 01/29/22   Educator [facility]  Instruction Review Code 1- Verbalizes Understanding        Education: Exercise & Equipment Safety: - Individual verbal instruction and demonstration of equipment use and safety with use of the equipment. Flowsheet Row Cardiac Rehab from 02/26/2022 in [facility] Cardiac and Pulmonary Rehab  Education need identified 01/06/22  Date 01/06/22  Educator [place]  Instruction Review Code 2- Demonstrated Understanding       Education: Exercise Physiology & General Exercise Guidelines: - Group verbal and written instruction with models to review the exercise physiology of the cardiovascular system and associated critical values. Provides general exercise guidelines with specific guidelines to those with heart or lung disease.  Flowsheet Row Cardiac Rehab from 02/26/2022 in [facility] Cardiac and Pulmonary Rehab  Education need identified 01/06/22  Date 01/15/22  Educator [facility]  Instruction Review Code 1- [employer] Understanding       Education: Flexibility, Balance, Mind/Body Relaxation: - Group verbal and visual presentation with interactive activity on the components of exercise prescription. Introduces F.I.T.T principle from ACSM for exercise prescriptions. Reviews F.I.T.T. principles of flexibility and balance exercise training including progression. Also discusses the mind body connection.  Reviews various relaxation techniques to help reduce and manage stress (i.e. Deep breathing, progressive muscle relaxation, and visualization). Balance handout provided to take home. Written material given at graduation. Flowsheet Row Cardiac Rehab from 02/26/2022 in [facility] Cardiac and Pulmonary Rehab  Date 02/05/22  Educator [facility]  Instruction Review Code 1- Verbalizes Understanding       Activity Barriers & Risk Stratification:  Activity Barriers & Cardiac Risk Stratification - 01/06/22 1515       Activity Barriers & Cardiac Risk Stratification   Activity Barriers Joint Problems;Muscular  Weakness;Decreased Ventricular Function;Other (comment)    Comments Right hand weakness, LVAD    Cardiac Risk Stratification High             6 Minute Walk:  6 Minute Walk     Row Name 01/06/22 1516         6 Minute Walk   Phase Initial     Distance 1435 feet     Walk Time 6 minutes     # of Rest Breaks 0     MPH 2.71     METS 3.21     RPE 10     Perceived Dyspnea  0     VO2 Peak 11.26     Symptoms No     Resting HR 71 bpm     Resting BP --  84- doppler     Resting Oxygen Saturation  99 %     Exercise Oxygen Saturation  during 6 min walk 99 %     Max Ex. HR 93 bpm     Max Ex. BP --  104- doppler     2 Minute Post BP --  94-doppler              Oxygen Initial Assessment:   Oxygen Re-Evaluation:   Oxygen Discharge (Final Oxygen Re-Evaluation):   Initial Exercise Prescription:  Initial Exercise Prescription - 01/06/22 1500  Date of Initial Exercise RX and Referring Provider   Date 01/06/22    Referring Provider Loralie Champagne MD      Oxygen   Maintain Oxygen Saturation 88% or higher      REL-XR   Level 2    Speed 50    Minutes 15    METs 3.2      Biostep-RELP   Level 2    SPM 50    Minutes 15    METs 3.2      Track   Laps 34    Minutes 15    METs 2.85      Prescription Details   Frequency (times per week) 2    Duration Progress to 30 minutes of continuous aerobic without signs/symptoms of physical distress      Intensity   THRR 40-80% of Max Heartrate 103- 135    Ratings of Perceived Exertion 11-13    Perceived Dyspnea 0-4      Progression   Progression Continue to progress workloads to maintain intensity without signs/symptoms of physical distress.      Resistance Training   Training Prescription Yes    Weight 3 lb    Reps 10-15             Perform Capillary Blood Glucose checks as needed.  Exercise Prescription Changes:   Exercise Prescription Changes     Row Name 01/06/22 1500 01/27/22 0800 01/27/22 1500  02/09/22 0900 02/24/22 0900     Response to Exercise   Blood Pressure (Admit) --  84- doppler -- --  88 dopplar --  88 doppler --  86 doppler   Blood Pressure (Exercise) --  104- doppler -- -- -- --   Blood Pressure (Exit) --  94 - doppler -- --  84 dopplar --  102 doppler --  82 doppler   Heart Rate (Admit) 71 bpm -- 68 bpm 69 bpm 81 bpm   Heart Rate (Exercise) 93 bpm -- 92 bpm 100 bpm 101 bpm   Heart Rate (Exit) 79 bpm -- 81 bpm 84 bpm 73 bpm   Oxygen Saturation (Admit) 99 % -- -- -- --   Oxygen Saturation (Exercise) 99 % -- -- -- --   Rating of Perceived Exertion (Exercise) 10 -- 14 14 15    Perceived Dyspnea (Exercise) 0 -- -- -- --   Symptoms none -- none none none   Comments walk test results -- -- -- --   Duration -- -- Continue with 30 min of aerobic exercise without signs/symptoms of physical distress. Continue with 30 min of aerobic exercise without signs/symptoms of physical distress. Continue with 30 min of aerobic exercise without signs/symptoms of physical distress.   Intensity -- -- THRR unchanged THRR unchanged THRR unchanged     Progression   Progression -- -- Continue to progress workloads to maintain intensity without signs/symptoms of physical distress. Continue to progress workloads to maintain intensity without signs/symptoms of physical distress. Continue to progress workloads to maintain intensity without signs/symptoms of physical distress.   Average METs -- -- 3.31 3.17 3.24     Resistance Training   Training Prescription -- -- Yes Yes Yes   Weight -- -- 3 lb 5 lb 5 lb   Reps -- -- 10-15 10-15 10-15     Interval Training   Interval Training -- -- No No No     Recumbant Elliptical   Level -- -- -- -- 2   Minutes -- -- -- -- 15  METs -- -- -- -- 1.9     REL-XR   Level -- -- 2 5 6    Minutes -- -- 15 15 15    METs -- -- 4.1 5.1 5.2     Biostep-RELP   Level -- -- 3 4 4    Minutes -- -- 15 15 15    METs -- -- 3 3 3      Track   Laps -- -- 34 38 40    Minutes -- -- 15 15 15    METs -- -- 2.85 3.07 3.18     Home Exercise Plan   Plans to continue exercise at -- Home (comment)  walking, weights, walk at park Home (comment)  walking, weights, walk at park Home (comment)  walking, weights, walk at park Home (comment)  walking, weights, walk at park   Frequency -- Add 2 additional days to program exercise sessions. Add 2 additional days to program exercise sessions. Add 2 additional days to program exercise sessions. Add 2 additional days to program exercise sessions.   Initial Home Exercises Provided -- 01/27/22 01/27/22 01/27/22 01/27/22     Oxygen   Maintain Oxygen Saturation -- -- 88% or higher 88% or higher 88% or higher            Exercise Comments:   Exercise Comments     Row Name 01/13/22 0800           Exercise Comments First full day of exercise!  Patient was oriented to gym and equipment including functions, settings, policies, and procedures.  Patient's individual exercise prescription and treatment plan were reviewed.  All starting workloads were established based on the results of the 6 minute walk test done at initial orientation visit.  The plan for exercise progression was also introduced and progression will be customized based on patient's performance and goals.                Exercise Goals and Review:   Exercise Goals     Row Name 01/06/22 1538             Exercise Goals   Increase Physical Activity Yes       Intervention Provide advice, education, support and counseling about physical activity/exercise needs.;Develop an individualized exercise prescription for aerobic and resistive training based on initial evaluation findings, risk stratification, comorbidities and participant's personal goals.       Expected Outcomes Short Term: Attend rehab on a regular basis to increase amount of physical activity.;Long Term: Exercising regularly at least 3-5 days a week.;Long Term: Add in home exercise to make  exercise part of routine and to increase amount of physical activity.       Increase Strength and Stamina Yes       Intervention Provide advice, education, support and counseling about physical activity/exercise needs.;Develop an individualized exercise prescription for aerobic and resistive training based on initial evaluation findings, risk stratification, comorbidities and participant's personal goals.       Expected Outcomes Short Term: Increase workloads from initial exercise prescription for resistance, speed, and METs.;Short Term: Perform resistance training exercises routinely during rehab and add in resistance training at home;Long Term: Improve cardiorespiratory fitness, muscular endurance and strength as measured by increased METs and functional capacity (6MWT)       Able to understand and use rate of perceived exertion (RPE) scale Yes       Intervention Provide education and explanation on how to use RPE scale       Expected Outcomes  Short Term: Able to use RPE daily in rehab to express subjective intensity level;Long Term:  Able to use RPE to guide intensity level when exercising independently       Able to understand and use Dyspnea scale Yes       Intervention Provide education and explanation on how to use Dyspnea scale       Expected Outcomes Short Term: Able to use Dyspnea scale daily in rehab to express subjective sense of shortness of breath during exertion;Long Term: Able to use Dyspnea scale to guide intensity level when exercising independently       Knowledge and understanding of Target Heart Rate Range (THRR) Yes       Intervention Provide education and explanation of THRR including how the numbers were predicted and where they are located for reference       Expected Outcomes Short Term: Able to use daily as guideline for intensity in rehab;Long Term: Able to use THRR to govern intensity when exercising independently;Short Term: Able to state/look up THRR       Able to check  pulse independently Yes       Intervention Review the importance of being able to check your own pulse for safety during independent exercise;Provide education and demonstration on how to check pulse in carotid and radial arteries.       Expected Outcomes Short Term: Able to explain why pulse checking is important during independent exercise;Long Term: Able to check pulse independently and accurately       Understanding of Exercise Prescription Yes       Intervention Provide education, explanation, and written materials on patient's individual exercise prescription       Expected Outcomes Short Term: Able to explain program exercise prescription;Long Term: Able to explain home exercise prescription to exercise independently                Exercise Goals Re-Evaluation :  Exercise Goals Re-Evaluation     Row Name 01/13/22 0800 01/27/22 0817 02/09/22 0953 02/17/22 0753 02/24/22 0922     Exercise Goal Re-Evaluation   Exercise Goals Review Increase Physical Activity;Able to understand and use rate of perceived exertion (RPE) scale;Knowledge and understanding of Target Heart Rate Range (THRR);Understanding of Exercise Prescription;Increase Strength and Stamina;Able to understand and use Dyspnea scale;Able to check pulse independently Increase Physical Activity;Increase Strength and Stamina;Able to understand and use rate of perceived exertion (RPE) scale;Able to understand and use Dyspnea scale;Knowledge and understanding of Target Heart Rate Range (THRR);Able to check pulse independently;Understanding of Exercise Prescription Increase Physical Activity;Increase Strength and Stamina;Understanding of Exercise Prescription Increase Physical Activity;Increase Strength and Stamina;Understanding of Exercise Prescription Increase Physical Activity;Increase Strength and Stamina;Understanding of Exercise Prescription   Comments Reviewed RPE and dyspnea scales, THR and program prescription with pt today.  Pt  voiced understanding and was given a copy of goals to take home. Reviewed home exercise with pt today.  Pt plans to walk at park for exercise.  He also has access to weight that he uses at home.  Reviewed THR, pulse, RPE, sign and symptoms, pulse oximetery and when to call 911 or MD.  Also discussed weather considerations and indoor options.  Pt voiced understanding. [name] is doing well in rehab. He has increased to 38 laps on the track! He also is now working at level 5 on the XR, working at 5.1 METS. He is now using 5 lb handweights. Will continue to monitor. [name] is doing well in rehab. He is trying to  find a place to go for after rehab to find someone that will push. We talked about the [facility] as a place for getting some training for planning his workouts.  He is going to stop by to talk to them one day.  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 [name] is doing well in rehab. He improved to level 6 on the XR machine. He also increased to 40 laps on the track. [name] has tolerated using 5 lb hand weights for exercise. We will continue to monitor his progress in the program.   Expected Outcomes Short: Use RPE daily to regulate intensity. Long: Follow program prescription in THR. Short: Continue to walk on off days at home Long: Continue to improve stamina. Short: Continue to increase laps on track Long: Continue to increase overall MET level Short: Conitnue to decide where to go after graduation Long: Conitnue to exercise independently Short: Continue to increase laps on track. Long: Continue to increase overall MET levels            Discharge Exercise Prescription (Final Exercise Prescription Changes):  Exercise Prescription Changes - 02/24/22 0900       Response to Exercise   Blood Pressure (Admit) --   86 doppler   Blood Pressure (Exit) --   82 doppler   Heart Rate (Admit) 81 bpm    Heart Rate (Exercise) 101 bpm    Heart Rate (Exit) 73 bpm    Rating of Perceived Exertion  (Exercise) 15    Symptoms none    Duration Continue with 30 min of aerobic exercise without signs/symptoms of physical distress.    Intensity THRR unchanged      Progression   Progression Continue to progress workloads to maintain intensity without signs/symptoms of physical distress.    Average METs 3.24      Resistance Training   Training Prescription Yes    Weight 5 lb    Reps 10-15      Interval Training   Interval Training No      Recumbant Elliptical   Level 2    Minutes 15    METs 1.9      REL-XR   Level 6    Minutes 15    METs 5.2      Biostep-RELP   Level 4    Minutes 15    METs 3      Track   Laps 40    Minutes 15    METs 3.18      Home Exercise Plan   Plans to continue exercise at Home (comment)   walking, weights, walk at park   Frequency Add 2 additional days to program exercise sessions.    Initial Home Exercises Provided 01/27/22      Oxygen   Maintain Oxygen Saturation 88% or higher             Nutrition:  Target Goals: Understanding of nutrition guidelines, daily intake of sodium <1546m, cholesterol <2027m calories 30% from fat and 7% or less from saturated fats, daily to have 5 or more servings of fruits and vegetables.  Education: All About Nutrition: -Group instruction provided by verbal, written material, interactive activities, discussions, models, and posters to present general guidelines for heart healthy nutrition including fat, fiber, MyPlate, the role of sodium in heart healthy nutrition, utilization of the nutrition label, and utilization of this knowledge for meal planning. Follow up email sent as well. Written material given at graduation.  Flowsheet Row Cardiac Rehab from 02/26/2022 in [facility] Cardiac and Pulmonary Rehab  Education need identified 01/06/22       Biometrics:  Pre Biometrics - 01/06/22 1515       Pre Biometrics   Height 5' 10.5" (1.791 m)    Weight 169 lb 4.8 oz (76.8 kg)    BMI (Calculated) 23.94     Single Leg Stand 30 seconds              Nutrition Therapy Plan and Nutrition Goals:  Nutrition Therapy & Goals - 02/05/22 1441       Nutrition Therapy   Diet Heart healthy, low Na, ESRD MNT    Drug/Food Interactions Coumadin/Vit K;Statins/Certain Fruits    Protein (specify units) 90g    Fiber 30 grams    Whole Grain Foods 3 servings   Limit with ESRD   Saturated Fats 16 max. grams    Fruits and Vegetables 8 servings/day    Sodium 2 grams      Personal Nutrition Goals   Nutrition Goal ST: practice looking for and modifying recipes, follow MyPlate guidelines, include heart healthy fats with meals LT: enjoy a variety of meals that meet his needs, meet calorie and protein needs    Comments Pt 71 y.o. M admitted to cardiac rehab for chronic systolic HF. PMHx includes LVAD, HTN, CAD, A.fib, HLD, ESRD on HD, severe protein-calorie malnutrition (12/212022). Most recent RD note 10/22/21: severe malnutrition in context of chronic illness, recommended a phosphate binder for phosphorus to avoid nutrition restrictions, recommended needs 2300-2500 kcal, 135-160g pro, weight went from 78.2kg to 69.3kg during hospital stay. 1st day of rehab 76.8kg. Relevant medications include remeron, protonix, prednisone, crestor, coumadin. PYP Score: 70. Vegetables & Fruits 6/12. Breads, Grains & Cereals 4/12. Red & Processed Meat 12/12. Poultry 2/2. Fish & Shellfish 3/4. Beans, Nuts & Seeds 2/4. Milk & Dairy Foods 4/6. Toppings, Oils, Seasonings & Salt 15/20. Sweets, Snacks & Restaurant Food 14/14. Beverages 8/10. Pt would not like to "get fat" - encouraged him to get adequate calories and protein for his higher needs to build and maintain lean muscle tissue. He does not remember talking with dietitians about in hospital. He rpeorts not speaking to anyone about nutrition before, but his wife is a good support system for him. His wife will cook, read labels, and monitor his potassium, phosphorus, and vitamin K with him.  He is pescatarian and he enjoys eggs and salmon for protein sources; discussed other vegetarian protein sources to include as well. Reviewed renal MNT as well as discussed heart healthy eating. Encouraged that with coumadin he can eat vitamin K rich foods as long as he is consistent and follows up with his care team regarding his plans. He would like to keep meals interesting while still meeting his nutritional needs; discussed some options and what he can look for.      Intervention Plan   Intervention Prescribe, educate and counsel regarding individualized specific dietary modifications aiming towards targeted core components such as weight, hypertension, lipid management, diabetes, heart failure and other comorbidities.    Expected Outcomes Short Term Goal: Understand basic principles of dietary content, such as calories, fat, sodium, cholesterol and nutrients.;Short Term Goal: A plan has been developed with personal nutrition goals set during dietitian appointment.;Long Term Goal: Adherence to prescribed nutrition plan.             Nutrition Assessments:  MEDIFICTS Score Key: ?70 Need to make dietary changes  40-70 Heart Healthy  Diet ? 40 Therapeutic Level Cholesterol Diet  Flowsheet Row Cardiac Rehab from 01/06/2022 in [facility] Cardiac and Pulmonary Rehab  Picture Your Plate Total Score on Admission 70      Picture Your Plate Scores: <71 Unhealthy dietary pattern with much room for improvement. 41-50 Dietary pattern unlikely to meet recommendations for good health and room for improvement. 51-60 More healthful dietary pattern, with some room for improvement.  >60 Healthy dietary pattern, although there may be some specific behaviors that could be improved.    Nutrition Goals Re-Evaluation:  Nutrition Goals Re-Evaluation     [place] Name 01/27/22 0626 02/17/22 0759           Goals   Nutrition Goal Meet with dietitian. ST: practice looking for and modifying recipes, follow MyPlate  guidelines, include heart healthy fats with meals LT: enjoy a variety of meals that meet his needs, meet calorie and protein needs      Comment [name] has not met with dietitian yet.  He was not sure if much help given vegetarian diet, but I encouraged him to check in to make sure he is getting a balance of nutrients. [name] has been doing well with his diet.  He met with Melissa last week and got some new recipes to try out.  He is looking forward to trying something new.  He continues to eat a good variety and likes the veggie bowls when he goes out.  He conitnues to get lots of heart healthy options with fruit and nuts too.      Expected Outcome Meet with dietitian Short: Try new recipes Long: Continue to focus on good balance               Nutrition Goals Discharge (Final Nutrition Goals Re-Evaluation):  Nutrition Goals Re-Evaluation - 02/17/22 0759       Goals   Nutrition Goal ST: practice looking for and modifying recipes, follow MyPlate guidelines, include heart healthy fats with meals LT: enjoy a variety of meals that meet his needs, meet calorie and protein needs    Comment [name] has been doing well with his diet.  He met with Melissa last week and got some new recipes to try out.  He is looking forward to trying something new.  He continues to eat a good variety and likes the veggie bowls when he goes out.  He conitnues to get lots of heart healthy options with fruit and nuts too.    Expected Outcome Short: Try new recipes Long: Continue to focus on good balance             Psychosocial: Target Goals: Acknowledge presence or absence of significant depression and/or stress, maximize coping skills, provide positive support system. Participant is able to verbalize types and ability to use techniques and skills needed for reducing stress and depression.   Education: Stress, Anxiety, and Depression - Group verbal and visual presentation to define topics covered.  Reviews how body is  impacted by stress, anxiety, and depression.  Also discusses healthy ways to reduce stress and to treat/manage anxiety and depression.  Written material given at graduation.   Education: Sleep Hygiene -Provides group verbal and written instruction about how sleep can affect your health.  Define sleep hygiene, discuss sleep cycles and impact of sleep habits. Review good sleep hygiene tips.    Initial Review & Psychosocial Screening:  Initial Psych Review & Screening - 12/31/21 1306       Initial Review   Current issues  with Current Stress Concerns    Source of Stress Concerns Chronic Illness      Family Dynamics   Good Support System? Yes      Barriers   Psychosocial barriers to participate in program There are no identifiable barriers or psychosocial needs.      Screening Interventions   Interventions Encouraged to exercise;Provide feedback about the scores to participant;To provide support and resources with identified psychosocial needs    Expected Outcomes Short Term goal: Utilizing psychosocial counselor, staff and physician to assist with identification of specific Stressors or current issues interfering with healing process. Setting desired goal for each stressor or current issue identified.;Long Term Goal: Stressors or current issues are controlled or eliminated.;Short Term goal: Identification and review with participant of any Quality of Life or Depression concerns found by scoring the questionnaire.;Long Term goal: The participant improves quality of Life and PHQ9 Scores as seen by post scores and/or verbalization of changes             Quality of Life Scores:   Quality of Life - 01/06/22 1512       Quality of Life   Select Quality of Life      Quality of Life Scores   Health/Function Pre 22 %    Socioeconomic Pre 24 %    Psych/Spiritual Pre 29.14 %    Family Pre 24 %    GLOBAL Pre 24.41 %            Scores of 19 and below usually indicate a poorer quality  of life in these areas.  A difference of  2-3 points is a clinically meaningful difference.  A difference of 2-3 points in the total score of the Quality of Life Index has been associated with significant improvement in overall quality of life, self-image, physical symptoms, and general health in studies assessing change in quality of life.  PHQ-9: Review Flowsheet       01/27/2022 01/06/2022 08/29/2021  Depression screen PHQ 2/9  Decreased Interest 0 2 0  Down, Depressed, Hopeless 0 1 1  PHQ - 2 Score 0 3 1  Altered sleeping 0 1 -  Tired, decreased energy 1 1 -  Change in appetite 0 2 -  Feeling bad or failure about yourself  0 2 -  Trouble concentrating 0 1 -  Moving slowly or fidgety/restless 0 0 -  Suicidal thoughts 0 2 -  PHQ-9 Score 1 12 -  Difficult doing work/chores - Somewhat difficult -   Interpretation of Total Score  Total Score Depression Severity:  1-4 = Minimal depression, 5-9 = Mild depression, 10-14 = Moderate depression, 15-19 = Moderately severe depression, 20-27 = Severe depression   Psychosocial Evaluation and Intervention:  Psychosocial Evaluation - 12/31/21 1311       Psychosocial Evaluation & Interventions   Interventions Encouraged to exercise with the program and follow exercise prescription;Relaxation education    Comments 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 He feels like he is adjusting well to all the new medical issues. He is taking it day by day. He can't partake in his usual activities, but is trying to adjust. He has a good support system. He wants to work on his gaining his strength back.    Expected Outcomes Short: attend cardiac rehab for education and exercise. Long; develop and maintain positive self care    Continue Psychosocial Services  Follow up required by staff  Psychosocial Re-Evaluation:  Psychosocial Re-Evaluation     Row Name 01/27/22 0820 02/17/22 0756            Psychosocial Re-Evaluation   Current issues with Current Stress Concerns;Current Depression Current Stress Concerns      Comments [name] is doing well in rehab.  His PHQ has greatly improved.  He is down to 1 from 12 when he started.  He has tried not to stress about anything and has cleared his mind from dealing with things from his sister.  He has been doing more at home and sleeping well again. [name] continues to do well in rehab.  He does his best to just to keep moving forward.  He says he can't go backwards so just to keep moving.  He trying to stay positive.  He is still mangaging a few more things from his sister before he feels that he will be released from that stress.  He continues to sleep well once he gets to bed. He has his granddaughter with him this week.  She likes to hug on him frequently.      Expected Outcomes Short: Continue to focus on positive Long: Conitnue to exercise for mental boost. Short: Finish up things with sister Long: continue to stay positive      Interventions Encouraged to attend Cardiac Rehabilitation for the exercise Encouraged to attend Cardiac Rehabilitation for the exercise      Continue Psychosocial Services  Follow up required by staff --               Psychosocial Discharge (Final Psychosocial Re-Evaluation):  Psychosocial Re-Evaluation - 02/17/22 0756       Psychosocial Re-Evaluation   Current issues with Current Stress Concerns    Comments [name] continues to do well in rehab.  He does his best to just to keep moving forward.  He says he can't go backwards so just to keep moving.  He trying to stay positive.  He is still mangaging a few more things from his sister before he feels that he will be released from that stress.  He continues to sleep well once he gets to bed. He has his granddaughter with him this week.  She likes to hug on him frequently.    Expected Outcomes Short: Finish up things with sister Long: continue to stay positive    Interventions  Encouraged to attend Cardiac Rehabilitation for the exercise             Vocational Rehabilitation: Provide vocational rehab assistance to qualifying candidates.   Vocational Rehab Evaluation & Intervention:  Vocational Rehab - 12/31/21 1306       Initial Vocational Rehab Evaluation & Intervention   Assessment shows need for Vocational Rehabilitation No             Education: Education Goals: Education classes will be provided on a variety of topics geared toward better understanding of heart health and risk factor modification. Participant will state understanding/return demonstration of topics presented as noted by education test scores.  Learning Barriers/Preferences:  Learning Barriers/Preferences - 12/31/21 1306       Learning Barriers/Preferences   Learning Barriers None    Learning Preferences None             General Cardiac Education Topics:  AED/CPR: - Group verbal and written instruction with the use of models to demonstrate the basic use of the AED with the basic ABC's of resuscitation.   Anatomy and Cardiac Procedures: - Group  verbal and visual presentation and models provide information about basic cardiac anatomy and function. Reviews the testing methods done to diagnose heart disease and the outcomes of the test results. Describes the treatment choices: Medical Management, Angioplasty, or Coronary Bypass Surgery for treating various heart conditions including Myocardial Infarction, Angina, Valve Disease, and Cardiac Arrhythmias.  Written material given at graduation. Flowsheet Row Cardiac Rehab from 02/26/2022 in [facility] Cardiac and Pulmonary Rehab  Education need identified 01/06/22  Date 01/29/22  Educator [facility]  Instruction Review Code 1- Verbalizes Understanding       Medication Safety: - Group verbal and visual instruction to review commonly prescribed medications for heart and lung disease. Reviews the medication, class of the drug, and side  effects. Includes the steps to properly store meds and maintain the prescription regimen.  Written material given at graduation. Flowsheet Row Cardiac Rehab from 02/26/2022 in [facility] Cardiac and Pulmonary Rehab  Date 02/19/22  Educator SB  Instruction Review Code 1- Verbalizes Understanding       Intimacy: - Group verbal instruction through game format to discuss how heart and lung disease can affect sexual intimacy. Written material given at graduation..   Know Your Numbers and Heart Failure: - Group verbal and visual instruction to discuss disease risk factors for cardiac and pulmonary disease and treatment options.  Reviews associated critical values for Overweight/Obesity, Hypertension, Cholesterol, and Diabetes.  Discusses basics of heart failure: signs/symptoms and treatments.  Introduces Heart Failure Zone chart for action plan for heart failure.  Written material given at graduation. Flowsheet Row Cardiac Rehab from 02/26/2022 in [facility] Cardiac and Pulmonary Rehab  Date 02/26/22  Educator SB  Instruction Review Code 1- Verbalizes Understanding       Infection Prevention: - Provides verbal and written material to individual with discussion of infection control including proper hand washing and proper equipment cleaning during exercise session. Flowsheet Row Cardiac Rehab from 02/26/2022 in [facility] Cardiac and Pulmonary Rehab  Education need identified 01/06/22  Date 01/06/22  Educator [place]  Instruction Review Code 1- Verbalizes Understanding       Falls Prevention: - Provides verbal and written material to individual with discussion of falls prevention and safety. Flowsheet Row Cardiac Rehab from 02/26/2022 in [facility] Cardiac and Pulmonary Rehab  Education need identified 01/06/22  Date 01/06/22  Educator [place]  Instruction Review Code 1- Verbalizes Understanding       Other: -Provides group and verbal instruction on various topics (see comments)   Knowledge Questionnaire Score:   Knowledge Questionnaire Score - 01/06/22 1459       Knowledge Questionnaire Score   Pre Score 22/26: Angina, HF, Exercise, Nutrition             Core Components/Risk Factors/Patient Goals at Admission:  Personal Goals and Risk Factors at Admission - 01/06/22 1538       Core Components/Risk Factors/Patient Goals on Admission    Weight Management Yes;Weight Maintenance    Intervention Weight Management: Develop a combined nutrition and exercise program designed to reach desired caloric intake, while maintaining appropriate intake of nutrient and fiber, sodium and fats, and appropriate energy expenditure required for the weight goal.;Weight Management: Provide education and appropriate resources to help participant work on and attain dietary goals.;Weight Management/Obesity: Establish reasonable short term and long term weight goals.    Admit Weight 169 lb (76.7 kg)    Goal Weight: Short Term 169 lb (76.7 kg)    Goal Weight: Long Term 169 lb (76.7 kg)    Expected Outcomes  Short Term: Continue to assess and modify interventions until short term weight is achieved;Long Term: Adherence to nutrition and physical activity/exercise program aimed toward attainment of established weight goal;Weight Maintenance: Understanding of the daily nutrition guidelines, which includes 25-35% calories from fat, 7% or less cal from saturated fats, less than 27m cholesterol, less than 1.5gm of sodium, & 5 or more servings of fruits and vegetables daily;Understanding recommendations for meals to include 15-35% energy as protein, 25-35% energy from fat, 35-60% energy from carbohydrates, less than 2075mof dietary cholesterol, 20-35 gm of total fiber daily;Understanding of distribution of calorie intake throughout the day with the consumption of 4-5 meals/snacks    Heart Failure Yes    Intervention Provide a combined exercise and nutrition program that is supplemented with education, support and counseling about heart  failure. Directed toward relieving symptoms such as shortness of breath, decreased exercise tolerance, and extremity edema.    Expected Outcomes Improve functional capacity of life;Short term: Attendance in program 2-3 days a week with increased exercise capacity. Reported lower sodium intake. Reported increased fruit and vegetable intake. Reports medication compliance.;Short term: Daily weights obtained and reported for increase. Utilizing diuretic protocols set by physician.;Long term: Adoption of self-care skills and reduction of barriers for early signs and symptoms recognition and intervention leading to self-care maintenance.    Hypertension Yes    Intervention Provide education on lifestyle modifcations including regular physical activity/exercise, weight management, moderate sodium restriction and increased consumption of fresh fruit, vegetables, and low fat dairy, alcohol moderation, and smoking cessation.;Monitor prescription use compliance.    Expected Outcomes Short Term: Continued assessment and intervention until BP is < 140/906mG in hypertensive participants. < 130/43m4m in hypertensive participants with diabetes, heart failure or chronic kidney disease.;Long Term: Maintenance of blood pressure at goal levels.             Education:Diabetes - Individual verbal and written instruction to review signs/symptoms of diabetes, desired ranges of glucose level fasting, after meals and with exercise. Acknowledge that pre and post exercise glucose checks will be done for 3 sessions at entry of program.   Core Components/Risk Factors/Patient Goals Review:   Goals and Risk Factor Review     Row Name 01/27/22 0824 02/17/22 0801           Core Components/Risk Factors/Patient Goals Review   Personal Goals Review Weight Management/Obesity;Heart Failure;Hypertension Weight Management/Obesity;Heart Failure;Hypertension      Review [name] is doing well in rehab.  His weight is holding fairly  steady. He wants to slowly build back to 175 lb.  Currently at 169 lb.  He wants to put the muscle back on.  He may not be getting enough protein so I told him this is where the dietitian can be helpful.  He has not had any heart failure symptoms.  His pressures are doing well.  He has been runnin garound 100s on dopplar in rehab.  He continues to check in with VAD clinic and has been getting good results. [name] continues to do well in rehab.  His weight is up a little, but he will lose it.  He still wants muscle and makes sure he is not retaining fluid.  He did get some veggie protein options.  His pressures are doing well in rehab (90s-100s).  He takes medarone before dialyisis to keep it good.      Expected Outcomes Short: Talk with dietitian about protein Long: Conitnue to monitor risk factors Short: Conitnue to keep close eye  on weight Long: Continue to manage heart failure               Core Components/Risk Factors/Patient Goals at Discharge (Final Review):   Goals and Risk Factor Review - 02/17/22 0801       Core Components/Risk Factors/Patient Goals Review   Personal Goals Review Weight Management/Obesity;Heart Failure;Hypertension    Review [name] continues to do well in rehab.  His weight is up a little, but he will lose it.  He still wants muscle and makes sure he is not retaining fluid.  He did get some veggie protein options.  His pressures are doing well in rehab (90s-100s).  He takes medarone before dialyisis to keep it good.    Expected Outcomes Short: Conitnue to keep close eye on weight Long: Continue to manage heart failure             ITP Comments:  ITP Comments     Row Name 12/31/21 1315 01/06/22 1457 01/07/22 1419 01/13/22 0800 02/04/22 0926   ITP Comments Initial telephone orientation completed. Diagnosis can be found in [facility] 3/14. EP orientation scheduled for Tuesday 4/18 at 10am. Completed 6MWT and gym orientation. Initial ITP created and sent for review to Dr. Emily Filbert, Medical Director. 30 Day review completed. Medical Director ITP review done, changes made as directed, and signed approval by Medical Director. First full day of exercise!  Patient was oriented to gym and equipment including functions, settings, policies, and procedures.  Patient's individual exercise prescription and treatment plan were reviewed.  All starting workloads were established based on the results of the 6 minute walk test done at initial orientation visit.  The plan for exercise progression was also introduced and progression will be customized based on patient's performance and goals. 30 Day review completed. Medical Director ITP review done, changes made as directed, and signed approval by Medical Director.    NEW    Row Name 02/05/22 1440 03/04/22 1402         ITP Comments Completed initial RD consultation 30 Day review completed. Medical Director ITP review done, changes made as directed, and signed approval by Medical Director.               Comments:

## 2022-03-05 ENCOUNTER — Ambulatory Visit (HOSPITAL_COMMUNITY): Payer: Self-pay | Admitting: Pharmacist

## 2022-03-05 DIAGNOSIS — I5022 Chronic systolic (congestive) heart failure: Secondary | ICD-10-CM

## 2022-03-05 DIAGNOSIS — Z95811 Presence of heart assist device: Secondary | ICD-10-CM

## 2022-03-05 LAB — POCT INR: INR: 1.7 — AB (ref 2.0–3.0)

## 2022-03-05 NOTE — Progress Notes (Signed)
Daily Session Note  Patient Details  Name: Isaac [name] MRN: [id number] Date of Birth: 09-Mar-1951 Referring Provider:   Flowsheet Row Cardiac Rehab from 01/06/2022 in [facility] Cardiac and Pulmonary Rehab  Referring Provider Loralie Champagne MD       Encounter Date: 03/05/2022  Check In:  Session Check In - 03/05/22 0802       Check-In   Supervising physician immediately available to respond to emergencies See telemetry face sheet for immediately available ER MD    Location [facility]-Cardiac & Pulmonary Rehab    Staff Present Justin Mend, RCP,RRT,BSRT;Vida Rigger, RN, BSN;Melissa [place], RDN, Tawanna Solo, MS, ASCM CEP, Exercise Physiologist    Virtual Visit No    Medication changes reported     No    Fall or balance concerns reported    No    Warm-up and Cool-down Performed on first and last piece of equipment    Resistance Training Performed Yes    VAD Patient? Yes    PAD/SET Patient? No      VAD patient   Has back up controller? Yes    Has spare charged batteries? Yes    Has battery cables? Yes    Has compatible battery clips? Yes      Pain Assessment   Currently in Pain? No/denies                Social History   Tobacco Use  Smoking Status Former  Smokeless Tobacco Never    Goals Met:  Proper associated with RPD/PD & O2 Sat Independence with exercise equipment Exercise tolerated well No report of concerns or symptoms today Strength training completed today  Goals Unmet:  Not Applicable  Comments: Pt able to follow exercise prescription today without complaint.  Will continue to monitor for progression.   Dr. Emily Filbert is Medical Director for [place].  Dr. Ottie Glazier is Medical Director for [facility] Pulmonary Rehabilitation.

## 2022-03-05 NOTE — Progress Notes (Signed)
LVAD INR 

## 2022-03-06 LAB — CUP PACEART REMOTE DEVICE CHECK
Battery Remaining Longevity: 12 mo
Battery Remaining Percentage: 16 %
Brady Statistic RA Percent Paced: 72 %
Brady Statistic RV Percent Paced: 60 %
Date Time Interrogation Session: 20230613035000
HighPow Impedance: 51 Ohm
Implantable Lead Implant Date: 20140205
Implantable Lead Implant Date: 20140205
Implantable Lead Location: 753859
Implantable Lead Location: 753860
Implantable Lead Model: 296
Implantable Lead Model: 4470
Implantable Lead Serial Number: 727876
Implantable Pulse Generator Implant Date: 20140205
Lead Channel Impedance Value: 376 Ohm
Lead Channel Impedance Value: 424 Ohm
Lead Channel Pacing Threshold Amplitude: 1 V
Lead Channel Pacing Threshold Amplitude: 2.3 V
Lead Channel Pacing Threshold Pulse Width: 0.5 ms
Lead Channel Pacing Threshold Pulse Width: 0.5 ms
Lead Channel Setting Pacing Amplitude: 2.5 V
Lead Channel Setting Pacing Amplitude: 3.5 V
Lead Channel Setting Pacing Pulse Width: 0.5 ms
Lead Channel Setting Sensing Sensitivity: 0.6 mV
Pulse Gen Serial Number: 113490

## 2022-03-10 DIAGNOSIS — Z95811 Presence of heart assist device: Secondary | ICD-10-CM

## 2022-03-10 DIAGNOSIS — I5022 Chronic systolic (congestive) heart failure: Secondary | ICD-10-CM

## 2022-03-10 NOTE — Progress Notes (Signed)
Daily Session Note  Patient Details  Name: Isaac [name] MRN: [id number] Date of Birth: 1950/12/07 Referring Provider:   Flowsheet Row Cardiac Rehab from 01/06/2022 in [facility] Cardiac and Pulmonary Rehab  Referring Provider Loralie Champagne MD       Encounter Date: 03/10/2022  Check In:  Session Check In - 03/10/22 0750       Check-In   Supervising physician immediately available to respond to emergencies See telemetry face sheet for immediately available ER MD    Location [facility]-Cardiac & Pulmonary Rehab    Staff Present Earlean Shawl, BS, ACSM CEP, Exercise Physiologist;Kamoni Gentles Rosalia Hammers, MPA, RN;Melissa Caiola, RDN, LDN    Virtual Visit No    Medication changes reported     No    Fall or balance concerns reported    No    Warm-up and Cool-down Performed on first and last piece of equipment    Resistance Training Performed Yes    VAD Patient? Yes    PAD/SET Patient? No      VAD patient   Has back up controller? Yes    Has spare charged batteries? Yes    Has battery cables? Yes    Has compatible battery clips? Yes      Pain Assessment   Currently in Pain? No/denies                Social History   Tobacco Use  Smoking Status Former  Smokeless Tobacco Never    Goals Met:  Independence with exercise equipment Exercise tolerated well No report of concerns or symptoms today Strength training completed today  Goals Unmet:  Not Applicable  Comments: Pt able to follow exercise prescription today without complaint.  Will continue to monitor for progression.    Dr. Emily Filbert is Medical Director for [place].  Dr. Ottie Glazier is Medical Director for [facility] Pulmonary Rehabilitation.

## 2022-03-12 ENCOUNTER — Ambulatory Visit (HOSPITAL_COMMUNITY): Payer: Self-pay | Admitting: Pharmacist

## 2022-03-12 DIAGNOSIS — Z95811 Presence of heart assist device: Secondary | ICD-10-CM

## 2022-03-12 DIAGNOSIS — I5022 Chronic systolic (congestive) heart failure: Secondary | ICD-10-CM

## 2022-03-12 LAB — POCT INR: INR: 2.1 (ref 2.0–3.0)

## 2022-03-12 NOTE — Progress Notes (Signed)
LVAD INR 

## 2022-03-12 NOTE — Progress Notes (Signed)
Daily Session Note  Patient Details  Name: Isaac [name] MRN: [id number] Date of Birth: 1951-05-08 Referring Provider:   Flowsheet Row Cardiac Rehab from 01/06/2022 in [facility] Cardiac and Pulmonary Rehab  Referring Provider Loralie Champagne MD       Encounter Date: 03/12/2022  Check In:  Session Check In - 03/12/22 0811       Check-In   Supervising physician immediately available to respond to emergencies See telemetry face sheet for immediately available ER MD    Location [facility]-Cardiac & Pulmonary Rehab    Staff Present Carson Myrtle, BS, RRT, CPFT;Joseph Karie Fetch, MPA, RN    Virtual Visit No    Medication changes reported     No    Fall or balance concerns reported    No    Warm-up and Cool-down Performed on first and last piece of equipment    Resistance Training Performed Yes    VAD Patient? Yes    PAD/SET Patient? No      VAD patient   Has back up controller? Yes    Has spare charged batteries? Yes    Has battery cables? Yes    Has compatible battery clips? Yes      Pain Assessment   Currently in Pain? No/denies                Social History   Tobacco Use  Smoking Status Former  Smokeless Tobacco Never    Goals Met:  Independence with exercise equipment Exercise tolerated well No report of concerns or symptoms today Strength training completed today  Goals Unmet:  Not Applicable  Comments: Pt able to follow exercise prescription today without complaint.  Will continue to monitor for progression.    Dr. Emily Filbert is Medical Director for [place].  Dr. Ottie Glazier is Medical Director for [facility] Pulmonary Rehabilitation.

## 2022-03-17 DIAGNOSIS — Z95811 Presence of heart assist device: Secondary | ICD-10-CM

## 2022-03-17 DIAGNOSIS — I5022 Chronic systolic (congestive) heart failure: Secondary | ICD-10-CM

## 2022-03-18 NOTE — Progress Notes (Signed)
Remote ICD transmission.   

## 2022-03-20 ENCOUNTER — Ambulatory Visit (HOSPITAL_COMMUNITY): Payer: Self-pay | Admitting: Pharmacist

## 2022-03-20 LAB — POCT INR: INR: 1.7 — AB (ref 2.0–3.0)

## 2022-03-20 NOTE — Progress Notes (Signed)
LVAD INR 

## 2022-03-24 ENCOUNTER — Encounter (HOSPITAL_COMMUNITY): Payer: Medicare HMO

## 2022-03-26 ENCOUNTER — Encounter: Payer: Medicare HMO | Attending: Cardiology

## 2022-03-26 DIAGNOSIS — I5022 Chronic systolic (congestive) heart failure: Secondary | ICD-10-CM | POA: Diagnosis present

## 2022-03-26 DIAGNOSIS — Z79899 Other long term (current) drug therapy: Secondary | ICD-10-CM | POA: Insufficient documentation

## 2022-03-26 DIAGNOSIS — Z48811 Encounter for surgical aftercare following surgery on the nervous system: Secondary | ICD-10-CM | POA: Diagnosis not present

## 2022-03-26 DIAGNOSIS — Z95811 Presence of heart assist device: Secondary | ICD-10-CM | POA: Diagnosis present

## 2022-03-26 DIAGNOSIS — Z48812 Encounter for surgical aftercare following surgery on the circulatory system: Secondary | ICD-10-CM | POA: Diagnosis not present

## 2022-03-26 DIAGNOSIS — I5043 Acute on chronic combined systolic (congestive) and diastolic (congestive) heart failure: Secondary | ICD-10-CM | POA: Insufficient documentation

## 2022-03-26 NOTE — Progress Notes (Signed)
Daily Session Note  Patient Details  Name: Isaac [name] MRN: [id number] Date of Birth: 1951-07-06 Referring Provider:   Flowsheet Row Cardiac Rehab from 01/06/2022 in [facility] Cardiac and Pulmonary Rehab  Referring Provider Loralie Champagne MD       Encounter Date: 03/26/2022  Check In:  Session Check In - 03/26/22 0800       Check-In   Supervising physician immediately available to respond to emergencies See telemetry face sheet for immediately available ER MD    Location [facility]-Cardiac & Pulmonary Rehab    Staff Present Antionette Fairy, BS, Exercise Physiologist;Joseph Bryant, RCP,RRT,BSRT;Kelly [place], MPA, RN;Melissa Winthrop, RDN, LDN    Virtual Visit No    Medication changes reported     No    Fall or balance concerns reported    No    Tobacco Cessation No Change    Warm-up and Cool-down Performed on first and last piece of equipment    Resistance Training Performed Yes    VAD Patient? Yes    PAD/SET Patient? No      VAD patient   Has back up controller? Yes    Has spare charged batteries? Yes    Has battery cables? Yes    Has compatible battery clips? Yes      PAD/SET Patient   Completed foot check today? No    Open wounds to report? No      Pain Assessment   Currently in Pain? No/denies    Multiple Pain Sites No                Social History   Tobacco Use  Smoking Status Former  Smokeless Tobacco Never    Goals Met:  Independence with exercise equipment Exercise tolerated well No report of concerns or symptoms today Strength training completed today  Goals Unmet:  Not Applicable  Comments: Pt able to follow exercise prescription today without complaint.  Will continue to monitor for progression.    Dr. Emily Filbert is Medical Director for [place].  Dr. Ottie Glazier is Medical Director for [facility] Pulmonary Rehabilitation.

## 2022-03-27 ENCOUNTER — Ambulatory Visit (HOSPITAL_COMMUNITY): Payer: Self-pay | Admitting: Pharmacist

## 2022-03-27 LAB — POCT INR: INR: 2.1 (ref 2.0–3.0)

## 2022-03-31 DIAGNOSIS — Z95811 Presence of heart assist device: Secondary | ICD-10-CM

## 2022-03-31 DIAGNOSIS — I5022 Chronic systolic (congestive) heart failure: Secondary | ICD-10-CM | POA: Diagnosis not present

## 2022-03-31 NOTE — Progress Notes (Signed)
Daily Session Note  Patient Details  Name: Isaac [name] MRN: [id number] Date of Birth: 1951/07/05 Referring Provider:   Flowsheet Row Cardiac Rehab from 01/06/2022 in [facility] Cardiac and Pulmonary Rehab  Referring Provider Loralie Champagne MD       Encounter Date: 03/31/2022  Check In:  Session Check In - 03/31/22 0750       Check-In   Supervising physician immediately available to respond to emergencies See telemetry face sheet for immediately available ER MD    Location [facility]-Cardiac & Pulmonary Rehab    Staff Present Earlean Shawl, BS, ACSM CEP, Exercise Physiologist;Jessica Luan Pulling, MA, RCEP, CCRP, Kathaleen Maser, MPA, RN    Virtual Visit No    Medication changes reported     No    Fall or balance concerns reported    No    Tobacco Cessation No Change    Warm-up and Cool-down Performed on first and last piece of equipment    Resistance Training Performed Yes    VAD Patient? Yes    PAD/SET Patient? No      VAD patient   Has back up controller? Yes    Has spare charged batteries? Yes    Has battery cables? Yes    Has compatible battery clips? Yes      Pain Assessment   Currently in Pain? No/denies                Social History   Tobacco Use  Smoking Status Former  Smokeless Tobacco Never    Goals Met:  Independence with exercise equipment Exercise tolerated well No report of concerns or symptoms today Strength training completed today  Goals Unmet:  Not Applicable  Comments: Pt able to follow exercise prescription today without complaint.  Will continue to monitor for progression.    Dr. Emily Filbert is Medical Director for [facility].  Dr. Ottie Glazier is Medical Director for [facility] Pulmonary Rehabilitation.

## 2022-04-01 ENCOUNTER — Encounter: Payer: Self-pay | Admitting: *Deleted

## 2022-04-01 DIAGNOSIS — Z95811 Presence of heart assist device: Secondary | ICD-10-CM

## 2022-04-01 DIAGNOSIS — I5022 Chronic systolic (congestive) heart failure: Secondary | ICD-10-CM

## 2022-04-01 NOTE — Progress Notes (Signed)
Cardiac Individual Treatment Plan  Patient Details  Name: Isaac [name] MRN: [id number] Date of Birth: [birth year] Referring Provider:   Flowsheet Row Cardiac Rehab from 01/06/2022 in [facility] Cardiac and Pulmonary Rehab  Referring Provider Loralie Champagne MD       Initial Encounter Date:  Flowsheet Row Cardiac Rehab from 01/06/2022 in [facility] Cardiac and Pulmonary Rehab  Date 01/06/22       Visit Diagnosis: Heart failure, chronic systolic ([place])  LVAD (left ventricular assist device) present ([place])  Patient's Home Medications on Admission:  Current Outpatient Medications:    albuterol (VENTOLIN HFA) 108 (90 Base) MCG/ACT inhaler, Inhale 2 puffs into the lungs every 6 (six) hours as needed. (Patient not taking: Reported on 12/09/2021), Disp: , Rfl:    amiodarone (PACERONE) 200 MG tablet, Take 0.5 tablets (100 mg total) by mouth daily., Disp: 30 tablet, Rfl: 6   midodrine (PROAMATINE) 10 MG tablet, Take 1 tablet (10 mg total) by mouth 3 (three) times a week. Take 10 mg (1 tablet) Monday, Wednesday, Friday 30 minutes before hemodialysis session, Disp: 60 tablet, Rfl: 2   mirtazapine (REMERON) 7.5 MG tablet, Take 1 tablet (7.5 mg total) by mouth at bedtime., Disp: 30 tablet, Rfl: 6   Multiple Vitamins-Minerals (CERTAVITE/ANTIOXIDANTS) TABS, Take 1 tablet by mouth at bedtime., Disp: 30 tablet, Rfl: 11   ondansetron (ZOFRAN) 4 MG tablet, Take 1 tablet (4 mg total) by mouth every 8 (eight) hours as needed for nausea or vomiting. (Patient not taking: Reported on 01/20/2022), Disp: 40 tablet, Rfl: 3   pantoprazole (PROTONIX) 40 MG tablet, Take 1 tablet (40 mg total) by mouth daily., Disp: 30 tablet, Rfl: 6   rosuvastatin (CRESTOR) 10 MG tablet, Take 1 tablet (10 mg total) by mouth daily., Disp: 30 tablet, Rfl: 6   sildenafil (REVATIO) 20 MG tablet, Take 1 tablet (20 mg total) by mouth 3 (three) times daily., Disp: 90 tablet, Rfl: 6   warfarin (COUMADIN) 2 MG tablet, Take 4 mg (2 tabs) every  Monday/Wednesday/Friday and 2 mg (1 tab) all other days or as directed by the [facility]., Disp: 90 tablet, Rfl: 11  Past Medical History: Past Medical History:  Diagnosis Date   Arrhythmia    atrial fibrillation   CHF (congestive heart failure) ([place])    Chronic kidney disease    Coronary artery disease    Hyperlipidemia    Hypertension    Myocardial infarct ([place])     Tobacco Use: Social History   Tobacco Use  Smoking Status Former  Smokeless Tobacco Never    Labs: Review Flowsheet  More data exists      Latest Ref Rng & Units 10/18/2021 10/19/2021 10/20/2021 10/21/2021 10/22/2021  Labs for ITP Cardiac and Pulmonary Rehab  O2 Saturation % 54.2  50.9  57.5  58.5  86.5      Exercise Target Goals: Exercise Program Goal: Individual exercise prescription set using results from initial 6 min walk test and THRR while considering  patient's activity barriers and safety.   Exercise Prescription Goal: Initial exercise prescription builds to 30-45 minutes a day of aerobic activity, 2-3 days per week.  Home exercise guidelines will be given to patient during program as part of exercise prescription that the participant will acknowledge.   Education: Aerobic Exercise: - Group verbal and visual presentation on the components of exercise prescription. Introduces F.I.T.T principle from ACSM for exercise prescriptions.  Reviews F.I.T.T. principles of aerobic exercise including progression. Written material given at graduation. Flowsheet Row Cardiac  Rehab from 03/26/2022 in [facility] Cardiac and Pulmonary Rehab  Date 03/26/22  Educator [facility]  Instruction Review Code 1- Verbalizes Understanding       Education: Resistance Exercise: - Group verbal and visual presentation on the components of exercise prescription. Introduces F.I.T.T principle from ACSM for exercise prescriptions  Reviews F.I.T.T. principles of resistance exercise including progression. Written material given at  graduation. Flowsheet Row Cardiac Rehab from 03/26/2022 in [facility] Cardiac and Pulmonary Rehab  Date 01/29/22  Educator [facility]  Instruction Review Code 1- Verbalizes Understanding        Education: Exercise & Equipment Safety: - Individual verbal instruction and demonstration of equipment use and safety with use of the equipment. Flowsheet Row Cardiac Rehab from 03/26/2022 in [facility] Cardiac and Pulmonary Rehab  Education need identified 01/06/22  Date 01/06/22  Educator [place]  Instruction Review Code 2- Demonstrated Understanding       Education: Exercise Physiology & General Exercise Guidelines: - Group verbal and written instruction with models to review the exercise physiology of the cardiovascular system and associated critical values. Provides general exercise guidelines with specific guidelines to those with heart or lung disease.  Flowsheet Row Cardiac Rehab from 03/26/2022 in [facility] Cardiac and Pulmonary Rehab  Education need identified 01/06/22  Date 01/15/22  Educator [facility]  Instruction Review Code 1- [employer] Understanding       Education: Flexibility, Balance, Mind/Body Relaxation: - Group verbal and visual presentation with interactive activity on the components of exercise prescription. Introduces F.I.T.T principle from ACSM for exercise prescriptions. Reviews F.I.T.T. principles of flexibility and balance exercise training including progression. Also discusses the mind body connection.  Reviews various relaxation techniques to help reduce and manage stress (i.e. Deep breathing, progressive muscle relaxation, and visualization). Balance handout provided to take home. Written material given at graduation. Flowsheet Row Cardiac Rehab from 03/26/2022 in [facility] Cardiac and Pulmonary Rehab  Date 02/05/22  Educator [facility]  Instruction Review Code 1- Verbalizes Understanding       Activity Barriers & Risk Stratification:  Activity Barriers & Cardiac Risk Stratification - 01/06/22 1515        Activity Barriers & Cardiac Risk Stratification   Activity Barriers Joint Problems;Muscular Weakness;Decreased Ventricular Function;Other (comment)    Comments Right hand weakness, LVAD    Cardiac Risk Stratification High             6 Minute Walk:  6 Minute Walk     Row Name 01/06/22 1516         6 Minute Walk   Phase Initial     Distance 1435 feet     Walk Time 6 minutes     # of Rest Breaks 0     MPH 2.71     METS 3.21     RPE 10     Perceived Dyspnea  0     VO2 Peak 11.26     Symptoms No     Resting HR 71 bpm     Resting BP --  84- doppler     Resting Oxygen Saturation  99 %     Exercise Oxygen Saturation  during 6 min walk 99 %     Max Ex. HR 93 bpm     Max Ex. BP --  104- doppler     2 Minute Post BP --  94-doppler              Oxygen Initial Assessment:   Oxygen Re-Evaluation:   Oxygen Discharge (Final Oxygen Re-Evaluation):  Initial Exercise Prescription:  Initial Exercise Prescription - 01/06/22 1500       Date of Initial Exercise RX and Referring Provider   Date 01/06/22    Referring Provider Loralie Champagne MD      Oxygen   Maintain Oxygen Saturation 88% or higher      REL-XR   Level 2    Speed 50    Minutes 15    METs 3.2      Biostep-RELP   Level 2    SPM 50    Minutes 15    METs 3.2      Track   Laps 34    Minutes 15    METs 2.85      Prescription Details   Frequency (times per week) 2    Duration Progress to 30 minutes of continuous aerobic without signs/symptoms of physical distress      Intensity   THRR 40-80% of Max Heartrate 103- 135    Ratings of Perceived Exertion 11-13    Perceived Dyspnea 0-4      Progression   Progression Continue to progress workloads to maintain intensity without signs/symptoms of physical distress.      Resistance Training   Training Prescription Yes    Weight 3 lb    Reps 10-15             Perform Capillary Blood Glucose checks as needed.  Exercise Prescription  Changes:   Exercise Prescription Changes     Row Name 01/06/22 1500 01/27/22 0800 01/27/22 1500 02/09/22 0900 02/24/22 0900     Response to Exercise   Blood Pressure (Admit) --  84- doppler -- --  88 dopplar --  88 doppler --  86 doppler   Blood Pressure (Exercise) --  104- doppler -- -- -- --   Blood Pressure (Exit) --  94 - doppler -- --  84 dopplar --  102 doppler --  82 doppler   Heart Rate (Admit) 71 bpm -- 68 bpm 69 bpm 81 bpm   Heart Rate (Exercise) 93 bpm -- 92 bpm 100 bpm 101 bpm   Heart Rate (Exit) 79 bpm -- 81 bpm 84 bpm 73 bpm   Oxygen Saturation (Admit) 99 % -- -- -- --   Oxygen Saturation (Exercise) 99 % -- -- -- --   Rating of Perceived Exertion (Exercise) 10 -- 14 14 15    Perceived Dyspnea (Exercise) 0 -- -- -- --   Symptoms none -- none none none   Comments walk test results -- -- -- --   Duration -- -- Continue with 30 min of aerobic exercise without signs/symptoms of physical distress. Continue with 30 min of aerobic exercise without signs/symptoms of physical distress. Continue with 30 min of aerobic exercise without signs/symptoms of physical distress.   Intensity -- -- THRR unchanged THRR unchanged THRR unchanged     Progression   Progression -- -- Continue to progress workloads to maintain intensity without signs/symptoms of physical distress. Continue to progress workloads to maintain intensity without signs/symptoms of physical distress. Continue to progress workloads to maintain intensity without signs/symptoms of physical distress.   Average METs -- -- 3.31 3.17 3.24     Resistance Training   Training Prescription -- -- Yes Yes Yes   Weight -- -- 3 lb 5 lb 5 lb   Reps -- -- 10-15 10-15 10-15     Interval Training   Interval Training -- -- No No No     Recumbant Elliptical  Level -- -- -- -- 2   Minutes -- -- -- -- 15   METs -- -- -- -- 1.9     REL-XR   Level -- -- 2 5 6    Minutes -- -- 15 15 15    METs -- -- 4.1 5.1 5.2     Biostep-RELP   Level  -- -- 3 4 4    Minutes -- -- 15 15 15    METs -- -- 3 3 3      Track   Laps -- -- 34 38 40   Minutes -- -- 15 15 15    METs -- -- 2.85 3.07 3.18     Home Exercise Plan   Plans to continue exercise at -- Home (comment)  walking, weights, walk at park Home (comment)  walking, weights, walk at park Home (comment)  walking, weights, walk at park Home (comment)  walking, weights, walk at park   Frequency -- Add 2 additional days to program exercise sessions. Add 2 additional days to program exercise sessions. Add 2 additional days to program exercise sessions. Add 2 additional days to program exercise sessions.   Initial Home Exercises Provided -- 01/27/22 01/27/22 01/27/22 01/27/22     Oxygen   Maintain Oxygen Saturation -- -- 88% or higher 88% or higher 88% or higher    Row Name 03/11/22 0900 03/25/22 0700           Response to Exercise   Blood Pressure (Admit) --  98 doppler --  84 doppler      Blood Pressure (Exit) --  84 doppler --  92 doppler      Heart Rate (Admit) 65 bpm 71 bpm      Heart Rate (Exercise) 99 bpm 102 bpm      Heart Rate (Exit) 70 bpm 70 bpm      Rating of Perceived Exertion (Exercise) 13 12      Symptoms none none      Duration Continue with 30 min of aerobic exercise without signs/symptoms of physical distress. Continue with 30 min of aerobic exercise without signs/symptoms of physical distress.      Intensity THRR unchanged THRR unchanged        Progression   Progression Continue to progress workloads to maintain intensity without signs/symptoms of physical distress. Continue to progress workloads to maintain intensity without signs/symptoms of physical distress.      Average METs 3.11 3.59        Resistance Training   Training Prescription Yes Yes      Weight 5 lb 5 lb      Reps 10-15 10-15        Interval Training   Interval Training No No        Recumbant Elliptical   Level 3 2.5      Minutes 15 15      METs 2.2 2.1        REL-XR   Level 5  up to  7 for a little bit 7      Minutes 15 15      METs 4.8 5.3        Track   Laps 38 43      Minutes 15 15      METs 3.07 3.34        Home Exercise Plan   Plans to continue exercise at Home (comment)  walking, weights, walk at park Home (comment)  walking, weights, walk at park      Frequency  Add 2 additional days to program exercise sessions. Add 2 additional days to program exercise sessions.      Initial Home Exercises Provided 01/27/22 01/27/22        Oxygen   Maintain Oxygen Saturation 88% or higher 88% or higher               Exercise Comments:   Exercise Comments     Row Name 01/13/22 0800           Exercise Comments First full day of exercise!  Patient was oriented to gym and equipment including functions, settings, policies, and procedures.  Patient's individual exercise prescription and treatment plan were reviewed.  All starting workloads were established based on the results of the 6 minute walk test done at initial orientation visit.  The plan for exercise progression was also introduced and progression will be customized based on patient's performance and goals.                Exercise Goals and Review:   Exercise Goals     Row Name 01/06/22 1538             Exercise Goals   Increase Physical Activity Yes       Intervention Provide advice, education, support and counseling about physical activity/exercise needs.;Develop an individualized exercise prescription for aerobic and resistive training based on initial evaluation findings, risk stratification, comorbidities and participant's personal goals.       Expected Outcomes Short Term: Attend rehab on a regular basis to increase amount of physical activity.;Long Term: Exercising regularly at least 3-5 days a week.;Long Term: Add in home exercise to make exercise part of routine and to increase amount of physical activity.       Increase Strength and Stamina Yes       Intervention Provide advice,  education, support and counseling about physical activity/exercise needs.;Develop an individualized exercise prescription for aerobic and resistive training based on initial evaluation findings, risk stratification, comorbidities and participant's personal goals.       Expected Outcomes Short Term: Increase workloads from initial exercise prescription for resistance, speed, and METs.;Short Term: Perform resistance training exercises routinely during rehab and add in resistance training at home;Long Term: Improve cardiorespiratory fitness, muscular endurance and strength as measured by increased METs and functional capacity (6MWT)       Able to understand and use rate of perceived exertion (RPE) scale Yes       Intervention Provide education and explanation on how to use RPE scale       Expected Outcomes Short Term: Able to use RPE daily in rehab to express subjective intensity level;Long Term:  Able to use RPE to guide intensity level when exercising independently       Able to understand and use Dyspnea scale Yes       Intervention Provide education and explanation on how to use Dyspnea scale       Expected Outcomes Short Term: Able to use Dyspnea scale daily in rehab to express subjective sense of shortness of breath during exertion;Long Term: Able to use Dyspnea scale to guide intensity level when exercising independently       Knowledge and understanding of Target Heart Rate Range (THRR) Yes       Intervention Provide education and explanation of THRR including how the numbers were predicted and where they are located for reference       Expected Outcomes Short Term: Able to use daily as guideline for intensity in  rehab;Long Term: Able to use THRR to govern intensity when exercising independently;Short Term: Able to state/look up THRR       Able to check pulse independently Yes       Intervention Review the importance of being able to check your own pulse for safety during independent  exercise;Provide education and demonstration on how to check pulse in carotid and radial arteries.       Expected Outcomes Short Term: Able to explain why pulse checking is important during independent exercise;Long Term: Able to check pulse independently and accurately       Understanding of Exercise Prescription Yes       Intervention Provide education, explanation, and written materials on patient's individual exercise prescription       Expected Outcomes Short Term: Able to explain program exercise prescription;Long Term: Able to explain home exercise prescription to exercise independently                Exercise Goals Re-Evaluation :  Exercise Goals Re-Evaluation     Row Name 01/13/22 0800 01/27/22 0817 02/09/22 0953 02/17/22 0753 02/24/22 0922     Exercise Goal Re-Evaluation   Exercise Goals Review Increase Physical Activity;Able to understand and use rate of perceived exertion (RPE) scale;Knowledge and understanding of Target Heart Rate Range (THRR);Understanding of Exercise Prescription;Increase Strength and Stamina;Able to understand and use Dyspnea scale;Able to check pulse independently Increase Physical Activity;Increase Strength and Stamina;Able to understand and use rate of perceived exertion (RPE) scale;Able to understand and use Dyspnea scale;Knowledge and understanding of Target Heart Rate Range (THRR);Able to check pulse independently;Understanding of Exercise Prescription Increase Physical Activity;Increase Strength and Stamina;Understanding of Exercise Prescription Increase Physical Activity;Increase Strength and Stamina;Understanding of Exercise Prescription Increase Physical Activity;Increase Strength and Stamina;Understanding of Exercise Prescription   Comments Reviewed RPE and dyspnea scales, THR and program prescription with pt today.  Pt voiced understanding and was given a copy of goals to take home. Reviewed home exercise with pt today.  Pt plans to walk at park for  exercise.  He also has access to weight that he uses at home.  Reviewed THR, pulse, RPE, sign and symptoms, pulse oximetery and when to call 911 or MD.  Also discussed weather considerations and indoor options.  Pt voiced understanding. Isaac [name] is doing well in rehab. He has increased to 38 laps on the track! He also is now working at level 5 on the XR, working at 5.1 METS. He is now using 5 lb handweights. Will continue to monitor. Isaac [name] is doing well in rehab. He is trying to find a place to go for after rehab to find someone that will push. We talked about the [facility] as a place for getting some training for planning his workouts.  He is going to stop by to talk to them one day.  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 Isaac [name] is doing well in rehab. He improved to level 6 on the XR machine. He also increased to 40 laps on the track. Isaac [name] has tolerated using 5 lb hand weights for exercise. We will continue to monitor his progress in the program.   Expected Outcomes Short: Use RPE daily to regulate intensity. Long: Follow program prescription in THR. Short: Continue to walk on off days at home Long: Continue to improve stamina. Short: Continue to increase laps on track Long: Continue to increase overall MET level Short: Conitnue to decide where to go after graduation Long: Conitnue to exercise  independently Short: Continue to increase laps on track. Long: Continue to increase overall MET levels    Row Name 03/11/22 0953 03/25/22 0723 03/26/22 0810         Exercise Goal Re-Evaluation   Exercise Goals Review Increase Physical Activity;Increase Strength and Stamina;Understanding of Exercise Prescription Increase Physical Activity;Increase Strength and Stamina;Understanding of Exercise Prescription Increase Physical Activity;Increase Strength and Stamina;Understanding of Exercise Prescription     Comments Isaac [name] continues to do well in rehab. He increased to level 3 on the REL and has been staying  steady around 35-40 laps on the track. He tried level 7 on the XR for the short time. We will continue to monitor. Isaac [name] is doing well in rehab. He is consistently getting above 40 laps on the track and has gotten as many as 43 laps. He also is up to level 7 on the XR. He increased his overall average MET level to 3.59 METs as well. We will continue to monitor Isaac [name]'s progress in the program. Isaac [name] is using weights (10lbs) at home - similar to rehab as well as walking (around the neighborhood) - about 2 miles. After graduation he was thinking of [facility] [place] [facility] Higher education careers adviser.     Expected Outcomes Short: Continue to work up load on XR Long: Continue to build up overall stamina and strength Short: Continue to get above 40 laps on the track. Long: Continue to increase overall MET level. ST: Continue to exercise outside of rehab LT: Conitnue to exercise independently              Discharge Exercise Prescription (Final Exercise Prescription Changes):  Exercise Prescription Changes - 03/25/22 0700       Response to Exercise   Blood Pressure (Admit) --   84 doppler   Blood Pressure (Exit) --   92 doppler   Heart Rate (Admit) 71 bpm    Heart Rate (Exercise) 102 bpm    Heart Rate (Exit) 70 bpm    Rating of Perceived Exertion (Exercise) 12    Symptoms none    Duration Continue with 30 min of aerobic exercise without signs/symptoms of physical distress.    Intensity THRR unchanged      Progression   Progression Continue to progress workloads to maintain intensity without signs/symptoms of physical distress.    Average METs 3.59      Resistance Training   Training Prescription Yes    Weight 5 lb    Reps 10-15      Interval Training   Interval Training No      Recumbant Elliptical   Level 2.5    Minutes 15    METs 2.1      REL-XR   Level 7    Minutes 15    METs 5.3      Track   Laps 43    Minutes 15    METs 3.34      Home Exercise Plan   Plans to continue  exercise at Home (comment)   walking, weights, walk at park   Frequency Add 2 additional days to program exercise sessions.    Initial Home Exercises Provided 01/27/22      Oxygen   Maintain Oxygen Saturation 88% or higher             Nutrition:  Target Goals: Understanding of nutrition guidelines, daily intake of sodium <1521m, cholesterol <2042m calories 30% from fat and 7% or less from saturated fats, daily  to have 5 or more servings of fruits and vegetables.  Education: All About Nutrition: -Group instruction provided by verbal, written material, interactive activities, discussions, models, and posters to present general guidelines for heart healthy nutrition including fat, fiber, MyPlate, the role of sodium in heart healthy nutrition, utilization of the nutrition label, and utilization of this knowledge for meal planning. Follow up email sent as well. Written material given at graduation. Flowsheet Row Cardiac Rehab from 03/26/2022 in [facility] Cardiac and Pulmonary Rehab  Education need identified 01/06/22       Biometrics:  Pre Biometrics - 01/06/22 1515       Pre Biometrics   Height 5' 10.5" (1.791 m)    Weight 169 lb 4.8 oz (76.8 kg)    BMI (Calculated) 23.94    Single Leg Stand 30 seconds              Nutrition Therapy Plan and Nutrition Goals:  Nutrition Therapy & Goals - 02/05/22 1441       Nutrition Therapy   Diet Heart healthy, low Na, ESRD MNT    Drug/Food Interactions Coumadin/Vit K;Statins/Certain Fruits    Protein (specify units) 90g    Fiber 30 grams    Whole Grain Foods 3 servings   Limit with ESRD   Saturated Fats 16 max. grams    Fruits and Vegetables 8 servings/day    Sodium 2 grams      Personal Nutrition Goals   Nutrition Goal ST: practice looking for and modifying recipes, follow MyPlate guidelines, include heart healthy fats with meals LT: enjoy a variety of meals that meet his needs, meet calorie and protein needs    Comments Pt 71 y.o.  M admitted to cardiac rehab for chronic systolic HF. PMHx includes LVAD, HTN, CAD, A.fib, HLD, ESRD on HD, severe protein-calorie malnutrition (12/212022). Most recent RD note 10/22/21: severe malnutrition in context of chronic illness, recommended a phosphate binder for phosphorus to avoid nutrition restrictions, recommended needs 2300-2500 kcal, 135-160g pro, weight went from 78.2kg to 69.3kg during hospital stay. 1st day of rehab 76.8kg. Relevant medications include remeron, protonix, prednisone, crestor, coumadin. PYP Score: 70. Vegetables & Fruits 6/12. Breads, Grains & Cereals 4/12. Red & Processed Meat 12/12. Poultry 2/2. Fish & Shellfish 3/4. Beans, Nuts & Seeds 2/4. Milk & Dairy Foods 4/6. Toppings, Oils, Seasonings & Salt 15/20. Sweets, Snacks & Restaurant Food 14/14. Beverages 8/10. Pt would not like to "get fat" - encouraged him to get adequate calories and protein for his higher needs to build and maintain lean muscle tissue. He does not remember talking with dietitians about in hospital. He rpeorts not speaking to anyone about nutrition before, but his wife is a good support system for him. His wife will cook, read labels, and monitor his potassium, phosphorus, and vitamin K with him. He is pescatarian and he enjoys eggs and salmon for protein sources; discussed other vegetarian protein sources to include as well. Reviewed renal MNT as well as discussed heart healthy eating. Encouraged that with coumadin he can eat vitamin K rich foods as long as he is consistent and follows up with his care team regarding his plans. He would like to keep meals interesting while still meeting his nutritional needs; discussed some options and what he can look for.      Intervention Plan   Intervention Prescribe, educate and counsel regarding individualized specific dietary modifications aiming towards targeted core components such as weight, hypertension, lipid management, diabetes, heart failure and other  comorbidities.  Expected Outcomes Short Term Goal: Understand basic principles of dietary content, such as calories, fat, sodium, cholesterol and nutrients.;Short Term Goal: A plan has been developed with personal nutrition goals set during dietitian appointment.;Long Term Goal: Adherence to prescribed nutrition plan.             Nutrition Assessments:  MEDIFICTS Score Key: ?70 Need to make dietary changes  40-70 Heart Healthy Diet ? 40 Therapeutic Level Cholesterol Diet  Flowsheet Row Cardiac Rehab from 01/06/2022 in [facility] Cardiac and Pulmonary Rehab  Picture Your Plate Total Score on Admission 70      Picture Your Plate Scores: <94 Unhealthy dietary pattern with much room for improvement. 41-50 Dietary pattern unlikely to meet recommendations for good health and room for improvement. 51-60 More healthful dietary pattern, with some room for improvement.  >60 Healthy dietary pattern, although there may be some specific behaviors that could be improved.    Nutrition Goals Re-Evaluation:  Nutrition Goals Re-Evaluation     Row Name 01/27/22 5859 02/17/22 0759 03/26/22 2924         Goals   Nutrition Goal Meet with dietitian. ST: practice looking for and modifying recipes, follow MyPlate guidelines, include heart healthy fats with meals LT: enjoy a variety of meals that meet his needs, meet calorie and protein needs ST: practice looking for and modifying recipes, follow MyPlate guidelines, include heart healthy fats with meals LT: enjoy a variety of meals that meet his needs, meet calorie and protein needs     Comment Isaac [name] has not met with dietitian yet.  He was not sure if much help given vegetarian diet, but I encouraged him to check in to make sure he is getting a balance of nutrients. Isaac [name] has been doing well with his diet.  He met with Melissa last week and got some new recipes to try out.  He is looking forward to trying something new.  He continues to eat a good variety and likes  the veggie bowls when he goes out.  He conitnues to get lots of heart healthy options with fruit and nuts too. Isaac [name] reports doing well with his diet. He reports increasing his variety and he has made the black bean burger and he liked it. He also reports doing roasted brussells sprouts and cauliflower. he continues with a heart healthy diet with no reported issues.     Expected Outcome Meet with dietitian Short: Try new recipes Long: Continue to focus on good balance ST: Continue to try new recipes LT: enjoy a variety of meals that meet his needs, meet calorie and protein needs              Nutrition Goals Discharge (Final Nutrition Goals Re-Evaluation):  Nutrition Goals Re-Evaluation - 03/26/22 0808       Goals   Nutrition Goal ST: practice looking for and modifying recipes, follow MyPlate guidelines, include heart healthy fats with meals LT: enjoy a variety of meals that meet his needs, meet calorie and protein needs    Comment Isaac [name] reports doing well with his diet. He reports increasing his variety and he has made the black bean burger and he liked it. He also reports doing roasted brussells sprouts and cauliflower. he continues with a heart healthy diet with no reported issues.    Expected Outcome ST: Continue to try new recipes LT: enjoy a variety of meals that meet his needs, meet calorie and protein needs  Psychosocial: Target Goals: Acknowledge presence or absence of significant depression and/or stress, maximize coping skills, provide positive support system. Participant is able to verbalize types and ability to use techniques and skills needed for reducing stress and depression.   Education: Stress, Anxiety, and Depression - Group verbal and visual presentation to define topics covered.  Reviews how body is impacted by stress, anxiety, and depression.  Also discusses healthy ways to reduce stress and to treat/manage anxiety and depression.  Written material given at  graduation. Flowsheet Row Cardiac Rehab from 03/26/2022 in [facility] Cardiac and Pulmonary Rehab  Date 03/12/22  Educator [facility]  Instruction Review Code 1- [employer] Understanding       Education: Sleep Hygiene -Provides group verbal and written instruction about how sleep can affect your health.  Define sleep hygiene, discuss sleep cycles and impact of sleep habits. Review good sleep hygiene tips.    Initial Review & Psychosocial Screening:  Initial Psych Review & Screening - 12/31/21 1306       Initial Review   Current issues with Current Stress Concerns    Source of Stress Concerns Chronic Illness      Family Dynamics   Good Support System? Yes      Barriers   Psychosocial barriers to participate in program There are no identifiable barriers or psychosocial needs.      Screening Interventions   Interventions Encouraged to exercise;Provide feedback about the scores to participant;To provide support and resources with identified psychosocial needs    Expected Outcomes Short Term goal: Utilizing psychosocial counselor, staff and physician to assist with identification of specific Stressors or current issues interfering with healing process. Setting desired goal for each stressor or current issue identified.;Long Term Goal: Stressors or current issues are controlled or eliminated.;Short Term goal: Identification and review with participant of any Quality of Life or Depression concerns found by scoring the questionnaire.;Long Term goal: The participant improves quality of Life and PHQ9 Scores as seen by post scores and/or verbalization of changes             Quality of Life Scores:   Quality of Life - 01/06/22 1512       Quality of Life   Select Quality of Life      Quality of Life Scores   Health/Function Pre 22 %    Socioeconomic Pre 24 %    Psych/Spiritual Pre 29.14 %    Family Pre 24 %    GLOBAL Pre 24.41 %            Scores of 19 and below usually indicate a poorer  quality of life in these areas.  A difference of  2-3 points is a clinically meaningful difference.  A difference of 2-3 points in the total score of the Quality of Life Index has been associated with significant improvement in overall quality of life, self-image, physical symptoms, and general health in studies assessing change in quality of life.  PHQ-9: Review Flowsheet       01/27/2022 01/06/2022 08/29/2021  Depression screen PHQ 2/9  Decreased Interest 0 2 0  Down, Depressed, Hopeless 0 1 1  PHQ - 2 Score 0 3 1  Altered sleeping 0 1 -  Tired, decreased energy 1 1 -  Change in appetite 0 2 -  Feeling bad or failure about yourself  0 2 -  Trouble concentrating 0 1 -  Moving slowly or fidgety/restless 0 0 -  Suicidal thoughts 0 2 -  PHQ-9 Score 1 12 -  Difficult doing work/chores - Somewhat difficult -   Interpretation of Total Score  Total Score Depression Severity:  1-4 = Minimal depression, 5-9 = Mild depression, 10-14 = Moderate depression, 15-19 = Moderately severe depression, 20-27 = Severe depression   Psychosocial Evaluation and Intervention:  Psychosocial Evaluation - 12/31/21 1311       Psychosocial Evaluation & Interventions   Interventions Encouraged to exercise with the program and follow exercise prescription;Relaxation education    Comments 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 He feels like he is adjusting well to all the new medical issues. He is taking it day by day. He can't partake in his usual activities, but is trying to adjust. He has a good support system. He wants to work on his gaining his strength back.    Expected Outcomes Short: attend cardiac rehab for education and exercise. Long; develop and maintain positive self care    Continue Psychosocial Services  Follow up required by staff             Psychosocial Re-Evaluation:  Psychosocial Re-Evaluation     [place] Name 01/27/22 0820 02/17/22 0756 03/26/22 0813          Psychosocial Re-Evaluation   Current issues with Current Stress Concerns;Current Depression Current Stress Concerns None Identified     Comments Isaac [name] is doing well in rehab.  His PHQ has greatly improved.  He is down to 1 from 12 when he started.  He has tried not to stress about anything and has cleared his mind from dealing with things from his sister.  He has been doing more at home and sleeping well again. Isaac [name] continues to do well in rehab.  He does his best to just to keep moving forward.  He says he can't go backwards so just to keep moving.  He trying to stay positive.  He is still mangaging a few more things from his sister before he feels that he will be released from that stress.  He continues to sleep well once he gets to bed. He has his granddaughter with him this week.  She likes to hug on him frequently. Isaac [name] continues to stay positive and to not let the things he can't control bother him. He loves photography and feels it keeps him relaxed. He relies on his wife and daughter and two sons for support, he reports having a team. He reports sleeping well.     Expected Outcomes Short: Continue to focus on positive Long: Conitnue to exercise for mental boost. Short: Finish up things with sister Long: continue to stay positive Continue to focus on positive Long: Conitnue to exercise for mental boost.     Interventions Encouraged to attend Cardiac Rehabilitation for the exercise Encouraged to attend Cardiac Rehabilitation for the exercise Encouraged to attend Cardiac Rehabilitation for the exercise     Continue Psychosocial Services  Follow up required by staff -- Follow up required by staff              Psychosocial Discharge (Final Psychosocial Re-Evaluation):  Psychosocial Re-Evaluation - 03/26/22 0813       Psychosocial Re-Evaluation   Current issues with None Identified    Comments Isaac [name] continues to stay positive and to not let the things he can't control bother him. He loves  photography and feels it keeps him relaxed. He relies on his wife and daughter and two sons for support, he reports having a team. He reports  sleeping well.    Expected Outcomes Continue to focus on positive Long: Conitnue to exercise for mental boost.    Interventions Encouraged to attend Cardiac Rehabilitation for the exercise    Continue Psychosocial Services  Follow up required by staff             Vocational Rehabilitation: Provide vocational rehab assistance to qualifying candidates.   Vocational Rehab Evaluation & Intervention:  Vocational Rehab - 12/31/21 1306       Initial Vocational Rehab Evaluation & Intervention   Assessment shows need for Vocational Rehabilitation No             Education: Education Goals: Education classes will be provided on a variety of topics geared toward better understanding of heart health and risk factor modification. Participant will state understanding/return demonstration of topics presented as noted by education test scores.  Learning Barriers/Preferences:  Learning Barriers/Preferences - 12/31/21 1306       Learning Barriers/Preferences   Learning Barriers None    Learning Preferences None             General Cardiac Education Topics:  AED/CPR: - Group verbal and written instruction with the use of models to demonstrate the basic use of the AED with the basic ABC's of resuscitation.   Anatomy and Cardiac Procedures: - Group verbal and visual presentation and models provide information about basic cardiac anatomy and function. Reviews the testing methods done to diagnose heart disease and the outcomes of the test results. Describes the treatment choices: Medical Management, Angioplasty, or Coronary Bypass Surgery for treating various heart conditions including Myocardial Infarction, Angina, Valve Disease, and Cardiac Arrhythmias.  Written material given at graduation. Flowsheet Row Cardiac Rehab from 03/26/2022 in [facility]  Cardiac and Pulmonary Rehab  Education need identified 01/06/22  Date 01/29/22  Educator [facility]  Instruction Review Code 1- Verbalizes Understanding       Medication Safety: - Group verbal and visual instruction to review commonly prescribed medications for heart and lung disease. Reviews the medication, class of the drug, and side effects. Includes the steps to properly store meds and maintain the prescription regimen.  Written material given at graduation. Flowsheet Row Cardiac Rehab from 03/26/2022 in [facility] Cardiac and Pulmonary Rehab  Date 02/19/22  Educator SB  Instruction Review Code 1- Verbalizes Understanding       Intimacy: - Group verbal instruction through game format to discuss how heart and lung disease can affect sexual intimacy. Written material given at graduation.. Flowsheet Row Cardiac Rehab from 03/26/2022 in [facility] Cardiac and Pulmonary Rehab  Date 03/26/22  Educator [facility]  Instruction Review Code 1- Verbalizes Understanding       Know Your Numbers and Heart Failure: - Group verbal and visual instruction to discuss disease risk factors for cardiac and pulmonary disease and treatment options.  Reviews associated critical values for Overweight/Obesity, Hypertension, Cholesterol, and Diabetes.  Discusses basics of heart failure: signs/symptoms and treatments.  Introduces Heart Failure Zone chart for action plan for heart failure.  Written material given at graduation. Flowsheet Row Cardiac Rehab from 03/26/2022 in [facility] Cardiac and Pulmonary Rehab  Date 02/26/22  Educator SB  Instruction Review Code 1- Verbalizes Understanding       Infection Prevention: - Provides verbal and written material to individual with discussion of infection control including proper hand washing and proper equipment cleaning during exercise session. Flowsheet Row Cardiac Rehab from 03/26/2022 in [facility] Cardiac and Pulmonary Rehab  Education need identified 01/06/22  Date 01/06/22  Educator [facility]  Instruction Review Code 1- Verbalizes Understanding       Falls Prevention: - Provides verbal and written material to individual with discussion of falls prevention and safety. Flowsheet Row Cardiac Rehab from 03/26/2022 in [facility] Cardiac and Pulmonary Rehab  Education need identified 01/06/22  Date 01/06/22  Educator [place]  Instruction Review Code 1- Verbalizes Understanding       Other: -Provides group and verbal instruction on various topics (see comments)   Knowledge Questionnaire Score:  Knowledge Questionnaire Score - 01/06/22 1459       Knowledge Questionnaire Score   Pre Score 22/26: Angina, HF, Exercise, Nutrition             Core Components/Risk Factors/Patient Goals at Admission:  Personal Goals and Risk Factors at Admission - 01/06/22 1538       Core Components/Risk Factors/Patient Goals on Admission    Weight Management Yes;Weight Maintenance    Intervention Weight Management: Develop a combined nutrition and exercise program designed to reach desired caloric intake, while maintaining appropriate intake of nutrient and fiber, sodium and fats, and appropriate energy expenditure required for the weight goal.;Weight Management: Provide education and appropriate resources to help participant work on and attain dietary goals.;Weight Management/Obesity: Establish reasonable short term and long term weight goals.    Admit Weight 169 lb (76.7 kg)    Goal Weight: Short Term 169 lb (76.7 kg)    Goal Weight: Long Term 169 lb (76.7 kg)    Expected Outcomes Short Term: Continue to assess and modify interventions until short term weight is achieved;Long Term: Adherence to nutrition and physical activity/exercise program aimed toward attainment of established weight goal;Weight Maintenance: Understanding of the daily nutrition guidelines, which includes 25-35% calories from fat, 7% or less cal from saturated fats, less than 248m cholesterol, less than 1.5gm of sodium, & 5 or more  servings of fruits and vegetables daily;Understanding recommendations for meals to include 15-35% energy as protein, 25-35% energy from fat, 35-60% energy from carbohydrates, less than 2028mof dietary cholesterol, 20-35 gm of total fiber daily;Understanding of distribution of calorie intake throughout the day with the consumption of 4-5 meals/snacks    Heart Failure Yes    Intervention Provide a combined exercise and nutrition program that is supplemented with education, support and counseling about heart failure. Directed toward relieving symptoms such as shortness of breath, decreased exercise tolerance, and extremity edema.    Expected Outcomes Improve functional capacity of life;Short term: Attendance in program 2-3 days a week with increased exercise capacity. Reported lower sodium intake. Reported increased fruit and vegetable intake. Reports medication compliance.;Short term: Daily weights obtained and reported for increase. Utilizing diuretic protocols set by physician.;Long term: Adoption of self-care skills and reduction of barriers for early signs and symptoms recognition and intervention leading to self-care maintenance.    Hypertension Yes    Intervention Provide education on lifestyle modifcations including regular physical activity/exercise, weight management, moderate sodium restriction and increased consumption of fresh fruit, vegetables, and low fat dairy, alcohol moderation, and smoking cessation.;Monitor prescription use compliance.    Expected Outcomes Short Term: Continued assessment and intervention until BP is < 140/9094mG in hypertensive participants. < 130/73m49m in hypertensive participants with diabetes, heart failure or chronic kidney disease.;Long Term: Maintenance of blood pressure at goal levels.             Education:Diabetes - Individual verbal and written instruction to review signs/symptoms of diabetes, desired ranges of glucose level fasting, after meals and  with exercise. Acknowledge  that pre and post exercise glucose checks will be done for 3 sessions at entry of program.   Core Components/Risk Factors/Patient Goals Review:   Goals and Risk Factor Review     Row Name 01/27/22 0824 02/17/22 0801 03/26/22 0757         Core Components/Risk Factors/Patient Goals Review   Personal Goals Review Weight Management/Obesity;Heart Failure;Hypertension Weight Management/Obesity;Heart Failure;Hypertension Weight Management/Obesity;Heart Failure;Hypertension     Review Isaac [name] is doing well in rehab.  His weight is holding fairly steady. He wants to slowly build back to 175 lb.  Currently at 169 lb.  He wants to put the muscle back on.  He may not be getting enough protein so I told him this is where the dietitian can be helpful.  He has not had any heart failure symptoms.  His pressures are doing well.  He has been runnin garound 100s on dopplar in rehab.  He continues to check in with [facility] clinic and has been getting good results. Isaac [name] continues to do well in rehab.  His weight is up a little, but he will lose it.  He still wants muscle and makes sure he is not retaining fluid.  He did get some veggie protein options.  His pressures are doing well in rehab (90s-100s).  He takes medarone before dialyisis to keep it good. Isaac [name] reports that his weight has been steadily increasing and he feels this is bodyweight increase and not fluid. He had diaysis yesterday and reports it has been going well - his potassium and phosphorus numbers are looking good as well. He reports his BP has been doing well while here (today 80 using doppler) - right now we check it at rehab and he has it checked at dialysis. He continues to take his medications as directed with no issues.     Expected Outcomes Short: Talk with dietitian about protein Long: Conitnue to monitor risk factors Short: Conitnue to keep close eye on weight Long: Continue to manage heart failure Short: Conitnue to keep close eye  on weight Long: Continue to manage heart failure              Core Components/Risk Factors/Patient Goals at Discharge (Final Review):   Goals and Risk Factor Review - 03/26/22 0757       Core Components/Risk Factors/Patient Goals Review   Personal Goals Review Weight Management/Obesity;Heart Failure;Hypertension    Review Isaac [name] reports that his weight has been steadily increasing and he feels this is bodyweight increase and not fluid. He had diaysis yesterday and reports it has been going well - his potassium and phosphorus numbers are looking good as well. He reports his BP has been doing well while here (today 80 using doppler) - right now we check it at rehab and he has it checked at dialysis. He continues to take his medications as directed with no issues.    Expected Outcomes Short: Conitnue to keep close eye on weight Long: Continue to manage heart failure             ITP Comments:  ITP Comments     Row Name 12/31/21 1315 01/06/22 1457 01/07/22 1419 01/13/22 0800 02/04/22 0926   ITP Comments Initial telephone orientation completed. Diagnosis can be found in [facility] 3/[facility]. EP orientation scheduled for Tuesday 4/18 at 10am. Completed 6MWT and gym orientation. Initial ITP created and sent for review to Dr. Emily Filbert, Medical Director. 30 Day review completed. Medical Director ITP review done, changes made as directed,  and signed approval by Medical Director. First full day of exercise!  Patient was oriented to gym and equipment including functions, settings, policies, and procedures.  Patient's individual exercise prescription and treatment plan were reviewed.  All starting workloads were established based on the results of the 6 minute walk test done at initial orientation visit.  The plan for exercise progression was also introduced and progression will be customized based on patient's performance and goals. 30 Day review completed. Medical Director ITP review done, changes made as  directed, and signed approval by Medical Director.    NEW    Row Name 02/05/22 1440 03/04/22 1402 04/01/22 1202       ITP Comments Completed initial RD consultation 30 Day review completed. Medical Director ITP review done, changes made as directed, and signed approval by Medical Director. 30 Day review completed. Medical Director ITP review done, changes made as directed, and signed approval by Medical Director.              Comments:

## 2022-04-02 ENCOUNTER — Encounter (HOSPITAL_COMMUNITY): Payer: Self-pay

## 2022-04-02 ENCOUNTER — Ambulatory Visit (HOSPITAL_COMMUNITY)
Admission: RE | Admit: 2022-04-02 | Discharge: 2022-04-02 | Disposition: A | Discharge status: Home / Self Care | Payer: Medicare HMO | Source: Ambulatory Visit | Attending: Cardiology | Admitting: Cardiology

## 2022-04-02 ENCOUNTER — Ambulatory Visit (HOSPITAL_COMMUNITY): Payer: Self-pay | Admitting: Pharmacist

## 2022-04-02 VITALS — BP 131/70 | HR 69 | Wt 176.5 lb

## 2022-04-02 DIAGNOSIS — I132 Hypertensive heart and chronic kidney disease with heart failure and with stage 5 chronic kidney disease, or end stage renal disease: Secondary | ICD-10-CM | POA: Insufficient documentation

## 2022-04-02 DIAGNOSIS — N186 End stage renal disease: Secondary | ICD-10-CM | POA: Diagnosis not present

## 2022-04-02 DIAGNOSIS — I5022 Chronic systolic (congestive) heart failure: Secondary | ICD-10-CM | POA: Diagnosis not present

## 2022-04-02 DIAGNOSIS — I480 Paroxysmal atrial fibrillation: Secondary | ICD-10-CM | POA: Insufficient documentation

## 2022-04-02 DIAGNOSIS — Z955 Presence of coronary angioplasty implant and graft: Secondary | ICD-10-CM | POA: Insufficient documentation

## 2022-04-02 DIAGNOSIS — Z79899 Other long term (current) drug therapy: Secondary | ICD-10-CM | POA: Insufficient documentation

## 2022-04-02 DIAGNOSIS — N179 Acute kidney failure, unspecified: Secondary | ICD-10-CM | POA: Insufficient documentation

## 2022-04-02 DIAGNOSIS — Z7901 Long term (current) use of anticoagulants: Secondary | ICD-10-CM | POA: Diagnosis not present

## 2022-04-02 DIAGNOSIS — I2510 Atherosclerotic heart disease of native coronary artery without angina pectoris: Secondary | ICD-10-CM | POA: Diagnosis not present

## 2022-04-02 DIAGNOSIS — Z95811 Presence of heart assist device: Secondary | ICD-10-CM

## 2022-04-02 DIAGNOSIS — I429 Cardiomyopathy, unspecified: Secondary | ICD-10-CM | POA: Diagnosis not present

## 2022-04-02 DIAGNOSIS — I071 Rheumatic tricuspid insufficiency: Secondary | ICD-10-CM | POA: Diagnosis not present

## 2022-04-02 DIAGNOSIS — Z992 Dependence on renal dialysis: Secondary | ICD-10-CM | POA: Insufficient documentation

## 2022-04-02 LAB — PROTIME-INR
INR: 2 — ABNORMAL HIGH (ref 0.8–1.2)
Prothrombin Time: 22.6 seconds — ABNORMAL HIGH (ref 11.4–15.2)

## 2022-04-02 LAB — LACTATE DEHYDROGENASE: LDH: 177 U/L (ref 98–192)

## 2022-04-02 MED ORDER — WARFARIN SODIUM 2 MG PO TABS: ORAL_TABLET | ORAL | 11 refills | Status: DC

## 2022-04-02 NOTE — Progress Notes (Addendum)
Patient presents for 2 month f/u in [facility] today with his wife. Reports no problems with VAD equipment or concerns with drive line.  Denies lightheadedness, dizziness, falls, heart failure symptoms, and signs of bleeding. States he feels great. Participating in [facility] cardiac rehab. He is really enjoying this. He and his wife report [place] now that he is stronger.   Dr. Aundra [name] reviewed labs from 03/28/22 [facility]). BUN 40 Creatinine 3.24. Pt reports he has been on HD holiday since last Thursday. Reports excellent urine output. He has follow up appt with Nephrology this afternoon.   Per Dr Isaac [name] needs TSH, T4, T3, and LFTs with next HD lab draw. If pt is not going to have HD treatment soon, will arrange for labs at [facility].   They are planning to travel to [facility] next month. Provided them with closest VAD centers. Documented below.   Updated Warfarin prescription sent to [facility] in [place] per request.   Pt and wife concerned about accuracy of home INR machine. Checked with home machine at time of lab draw. Home machine- 2.0. INR 2.0 per lab draw.  Vital Signs:  Temp: Doppler Pressure: 98 Automatc BP: 131/70 (87) HR: 69 SPO2: 100% on RA   Weight: 176.5 lb w/ eqt Last weight: 172.6 lbs w/eqt   VAD Indication: Destination Therapy due to age and kidney disease   LVAD assessment:  HM III: Speed: 5300 rpms Flow: 4.8 Power: 3.9 w    PI: 3.8 Alarms: none Events: rare PI events  Fixed speed: 5300 Low speed limit: 5000  Primary controller: back up battery due for replacement in 26 months Secondary controller:  back up battery due for replacement in 26 months   I reviewed the LVAD parameters from today and compared the results to the patient's prior recorded data. LVAD interrogation was NEGATIVE for significant power changes, NEGATIVE for clinical alarms and STABLE for PI events/speed drops. No programming  changes were made and pump is functioning within specified parameters. Pt is performing daily controller and system monitor self tests along with completing weekly and monthly maintenance for LVAD equipment.   LVAD equipment check completed and is in good working order. Back-up equipment present. Charged back up battery and performed self-test on equipment.    Annual Equipment Maintenance on UBC/PM was performed on 10/2021.   Exit Site Care: Wife has been performing weekly dressing changes using weekly dressings. Notes at times he has small amount of tissue fat necrosis drainage. Drive line anchor correctly applied. Pt denies fever or chills. Provided patient with 7 daily kits, 8 weekly kits, and 10 anchors for home use. May switch back to gauze dressing if drainage continues. Will consider teaching to shower at next visit if drainage has stopped and exit site completely healed.    Device:  [employer] dual  Therapies: on Pacing: AV paced 60 >> increased to 70  Last check: 12/02/21    BP & Labs:  Doppler 98 - Doppler is reflecting MAP   Hgb not drawn today - No S/S of bleeding. Specifically denies melena/BRBPR or nosebleeds.   LDH stable at 195 with established baseline of 200 - 300. Denies tea-colored urine. No power elevations noted on interrogation.   6 month Intermacs follow up completed including:  Quality of Life, KCCQ-12, and Neurocognitive trail making.   Pt completed 1525 feet during 6 minute walk.  Back up controller: 11V backup battery charged during  this visit.  Patient Goals: Pt has met his goal of being able to return to walking around [place] the zoo. They have been able to go a few times recently, and have loved it. He would like to be able to stop dialysis. He has been on HD holiday for the last week, and he feels like he is "getting his life back." He also wants to be able to travel more with his wife.   [place] Cardiomyopathy Questionnaire     04/02/2022     3:35 PM 12/09/2021    1:26 PM  KCCQ-12  1 a. Ability to shower/bathe Other, Did not do Not at all limited  1 b. Ability to walk 1 block Not at all limited Quite a bit limited  1 c. Ability to hurry/jog Not at all limited Slightly limited  2. Edema feet/ankles/legs Never over the past 2 weeks Less than once a week  3. Limited by fatigue Less than once a week 3+ times per week, not every day  4. Limited by dyspnea Less than once a week At least once a day  5. Sitting up / on 3+ pillows Never over the past 2 weeks Never over the past 2 weeks  6. Limited enjoyment of life Slightly limited Not limited at all  7. Rest of life w/ symptoms Completely satisfied Completely satisfied  8 a. Participation in hobbies Slightly limited Limited quite a bit  8 b. Participation in chores Did not limit at all Limited quite a bit  8 c. Visiting family/friends Did not limit at all Limited quite a bit     Patient Instructions:  No medication changes Coumadin dosing per Isaac [name] PharmD Return to [place] [facility] in 2 months 4. Below are the closest VAD centers to [name]:   [facility], [facility] [name] [address] +1 [phone] [facility] [address] [place] [address] +1 [phone]   Isaac Monte RN [place] Coordinator  Office: [phone]  24/7 Pager: [phone]    Cardiology: Dr. Aundra [name] PCP: Isaac Pink, MD  Follow up for Heart Failure/LVAD: 71 y.o. with history of CAD, HTN, atrial fibrillation, and chronic systolic CHF returns for followup of CHF/LVAD.   Patient has had a long-standing cardiomyopathy, EF had been in the 25% range for years.  He had PCI to OM1 in 2007 and RCA in 2013.  From his report, these episodes do not sound like ACS events.  Echo in 12/22 showed EF < 20%, severe LV dilation, restrictive diastolic function, moderate RV dysfunction, moderate MR, mod-severe TR. HF was been complicated by CKD stage 3.  He was also noted on device interrogation to have been in atrial fibrillation persistently since 10/22.  He was admitted at [facility] in 12/22.  BP was low, his BP-active meds were stopped and midodrine was begun.  He was diuresed and discharged home.    He reports ongoing severe dyspnea and was admitted again in 12/22 after he stood up then passed out at home.  Device interrogation showed run of VT treated x 2 with ATP possibly around the time of syncope.  RHC in 12/22 showed low output HF and he developed cardiogenic shock. Impella 5.5 was placed.  On 09/17/21, HM3 LVAD was placed, ICD leads were plastered to the tricuspid valve with severe TR, the valve was not replaced.  The patient had significant interoperative hypotension.  Post-operatively, he developed progressive renal failure and was started on  CVVH then iHD.  He had a bumpy post-op course with renal failure and RV failure, but was ultimately able to discharge home on 10/24/21.   Ramp echo in 3/23, RV mildly dilated with normal systolic function, severe TR, LV EF 20-25%, aortic valve opens every beat.  Speed increased to 5300 rpm. The IV septum was mildly leftwards at 5300 rpm but flow increased and AoV still opened every beat.   Patient returns for LVAD followup. He is making more urine and has been off HD for the last week. He is going to see the nephrologist tomorrow to decide on whether he can cut back on dialysis.  He is doing cardiac rehab.  Getting stronger and generally feeling good. No dyspnea walking on flat ground.  Able to drive and get around stores.  MAP controlled.  Weight stable.  LVAD parameters stable.   Denies LVAD alarms.  Denies driveline trauma, erythema or drainage.  Denies ICD shocks.   Reports taking Coumadin as prescribed and adherence to anticoagulation based dietary restrictions.  Denies bright red blood per rectum or melena, no dark urine or hematuria.    Labs (3/23): LFTs normal, TSH mildly elevated Labs (7/23): hgb  12.1  PMH: 1. HTN 2. Hyperlipidemia 3. CAD: H/o PCI to [place] 2007 and PCI to RCA in 2013.  Uncertain of circumstances but do not sound like ACS events.  4. ESRD 5. Atrial fibrillation: Paroxysmal 6. Chronic systolic CHF: Long-standing cardiomyopathy with EF in the 25% range.  Cause uncertain, has history of PCI to RCA and OM1, but degree of CAD does not seem to explain severity of cardiomyopathy. Father had history of CAD/CHF.  Not heavy drinker. Has [employer] ICD.  - Echo (12/22): EF < 20%, severe LV dilation, restrictive diastolic function, moderate RV dysfunction, moderate MR, mod-severe TR.  - 12/22 cardiogenic shock with Impella 5.5 placement then Heartmate 3 LVAD.  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  7. VT 8. RV failure/severe TR   Current Outpatient Medications  Medication Sig Dispense Refill   amiodarone (PACERONE) 200 MG tablet Take 0.5 tablets (100 mg total) by mouth daily. 30 tablet 6   midodrine (PROAMATINE) 10 MG tablet Take 1 tablet (10 mg total) by mouth 3 (three) times a week. Take 10 mg (1 tablet) Monday, Wednesday, Friday 30 minutes before hemodialysis session 60 tablet 2   mirtazapine (REMERON) 7.5 MG tablet Take 1 tablet (7.5 mg total) by mouth at bedtime. 30 tablet 6   pantoprazole (PROTONIX) 40 MG tablet Take 1 tablet (40 mg total) by mouth daily. 30 tablet 6   rosuvastatin (CRESTOR) 10 MG tablet Take 1 tablet (10 mg total) by mouth daily. 30 tablet 6   sildenafil (REVATIO) 20 MG tablet Take 1 tablet (20 mg total) by mouth 3 (three) times daily. 90 tablet 6   albuterol (VENTOLIN HFA) 108 (90 Base) MCG/ACT inhaler Inhale 2 puffs into the lungs every 6 (six) hours as needed. (Patient not taking: Reported on 12/09/2021)     Multiple Vitamins-Minerals (CERTAVITE/ANTIOXIDANTS) TABS Take 1 tablet by mouth at bedtime. (Patient not taking: Reported on 04/02/2022) 30 tablet 11   ondansetron (ZOFRAN) 4 MG tablet Take 1 tablet (4 mg  total) by mouth every 8 (eight) hours as needed for nausea or vomiting. (Patient not taking: Reported on 01/20/2022) 40 tablet 3   warfarin (COUMADIN) 2 MG tablet Take 4 mg (2 tabs) every Monday/Wednesday/Friday and 2 mg (1 tab) all other days  or as directed by the [facility]. 120 tablet 11   No current facility-administered medications for this encounter.    Mushroom extract complex and Neosporin [neomycin-bacitracin zn-polymyx]  REVIEW OF SYSTEMS: All systems negative except as listed in HPI, PMH and Problem list.   LVAD INTERROGATION:  See LVAD nurse's note above.   I reviewed the LVAD parameters from today, and compared the results to the patient's prior recorded data.  No programming changes were made.  The LVAD is functioning within specified parameters.  The patient performs LVAD self-test daily.  LVAD interrogation was negative for any significant power changes, alarms or PI events/speed drops.  LVAD equipment check completed and is in good working order.  Back-up equipment present.   LVAD education done on emergency procedures and precautions and reviewed exit site care.    Vitals:   04/02/22 1120 04/02/22 1125  BP: (!) 98/0 131/70  Pulse: 69   SpO2: 100%   Weight: 80.1 kg (176 lb 8 oz)   MAP 87  Physical Exam: General: Well appearing this am. NAD.  HEENT: Normal. Neck: Supple, JVP 7-8 cm. Carotids OK.  Cardiac:  Mechanical heart sounds with LVAD hum present.  Lungs:  CTAB, normal effort.  Abdomen:  NT, ND, no HSM. No bruits or masses. +BS  LVAD exit site: Well-healed and incorporated. Dressing dry and intact. No erythema or drainage. Stabilization device present and accurately applied. Driveline dressing changed daily per sterile technique. Extremities:  Warm and dry. No cyanosis, clubbing, rash, or edema.  Neuro:  Alert & oriented x 3. Cranial nerves grossly intact. Moves all 4 extremities w/o difficulty. Affect pleasant    ASSESSMENT AND PLAN: 1. Chronic  systolic CHF:  Long-standing cardiomyopathy.  [place].  Echo in 12/22 with EF < 20%, severe LV dilation, restrictive diastolic function, moderate RV dysfunction, moderate MR, mod-severe TR. Cause of cardiomyopathy is uncertain.  He has a history of CAD, but I do not think that the described CAD from the past could explain his cardiomyopathy, but CAD could have progressed.  He was admitted in 12/22 with cardiogenic shock and Impella 5.5. s/p HM III VAD on 09/17/22.  Post-op course complicated by AKI and RV failure. Now on HD for volume management, looks euvolemic.  NYHA class I-II, getting more active and eating better.  He is doing cardiac rehab . - Continue midodrine 10 mg MWF pre-HD.  - Continue sildenafil 20 tid for RV.   - Warfarin for INR 2-2.5.   - He is now off ASA.  - Continue cardiac rehab.  2. Tricuspid regurgitation:  Tricuspid repair not done at time of VAD due to proximity of ICD wires and hypotension during surgery.  He has severe TR.  3. Atrial fibrillation: Regular rhythm. - Continue amiodarone 100 mg daily, I will arrange to check LFTs and TSH.  He will need a regular eye exam. 4. ESRD: Suspect intra-op hypotension at the time of LVAD placement led to development of ATN => urine sediment looked like ATN per renal. Had initial CVVH and transitioned over to HD. He is starting to make more urine, has been on a dialysis holiday for the last week.   - To see nephrology tomorrow to decide on whether he can cut back more long-term on HD.  - Continue midodrine on the mornings of HD.    5. CAD: History of PCI to OM1 in 2007 and RCA in 2013.  No CP or ACs.  - Deferred cardiac cath  in 12/22 due to AKI and plan for VAD - Continue Crestor.    Followup in 2 months.   Loralie Champagne 04/03/2022

## 2022-04-02 NOTE — Progress Notes (Signed)
Daily Session Note  Patient Details  Name: Isaac [name] MRN: [id number] Date of Birth: 1951-09-01 Referring Provider:   Flowsheet Row Cardiac Rehab from 01/06/2022 in [facility] Cardiac and Pulmonary Rehab  Referring Provider Loralie Champagne MD       Encounter Date: 04/02/2022  Check In:  Session Check In - 04/02/22 0744       Check-In   Supervising physician immediately available to respond to emergencies See telemetry face sheet for immediately available ER MD    Location [facility]-Cardiac & Pulmonary Rehab    Staff Present Birdie Sons, MPA, RN;Melissa Lopeno, RDN, LDN;Jessica Lamesa, MA, RCEP, CCRP, CCET;Joseph [place], [place]    Virtual Visit No    Medication changes reported     Yes    Comments no longer on dialysis, taking 26m lasix daily    Fall or balance concerns reported    No    Tobacco Cessation No Change    Warm-up and Cool-down Performed on first and last piece of equipment    Resistance Training Performed Yes    VAD Patient? Yes    PAD/SET Patient? No      VAD patient   Has back up controller? Yes    Has spare charged batteries? Yes    Has battery cables? Yes    Has compatible battery clips? Yes      Pain Assessment   Currently in Pain? No/denies                Social History   Tobacco Use  Smoking Status Former  Smokeless Tobacco Never    Goals Met:  Independence with exercise equipment Exercise tolerated well No report of concerns or symptoms today Strength training completed today  Goals Unmet:  Not Applicable  Comments: Pt able to follow exercise prescription today without complaint.  Will continue to monitor for progression.    Dr. MEmily Filbertis Medical Director for [place]  Dr. FOttie Glazieris Medical Director for [facility].

## 2022-04-02 NOTE — Patient Instructions (Addendum)
No medication changes Coumadin dosing per Lauren PharmD Return to [facility] in 2 months 4. Below are the closest VAD centers to [facility]:   [place], [facility] [place] [address] +1 [facility] [address] [place] [address] +1 [phone]

## 2022-04-03 ENCOUNTER — Other Ambulatory Visit (HOSPITAL_COMMUNITY): Payer: Self-pay | Admitting: *Deleted

## 2022-04-03 DIAGNOSIS — Z79899 Other long term (current) drug therapy: Secondary | ICD-10-CM

## 2022-04-03 DIAGNOSIS — Z95811 Presence of heart assist device: Secondary | ICD-10-CM

## 2022-04-03 NOTE — Progress Notes (Signed)
Per Dr Aundra Dubin pt needs CMET, TSH, T4, T3 drawn with next HD treatment.   Discussed the above with pt. Per nephrology appt yesterday there is discussion of stopping HD (has been on HD holiday for 7 days) and possibly discontinuing his HD cath. Pt goes to cardiac rehab 2 days a week at [facility]. Will have pt go to [facility] lab next week for lab draw. He verbalized understanding of need for labs at [facility]. Orders placed and released in epic.   Emerson Monte RN [place] Coordinator  Office: [phone]  24/7 Pager: [phone]

## 2022-04-07 ENCOUNTER — Other Ambulatory Visit
Admission: RE | Admit: 2022-04-07 | Discharge: 2022-04-07 | Disposition: A | Discharge status: Home / Self Care | Payer: Medicare HMO | Source: Home / Self Care | Attending: Cardiology | Admitting: Cardiology

## 2022-04-07 DIAGNOSIS — I5022 Chronic systolic (congestive) heart failure: Secondary | ICD-10-CM

## 2022-04-07 DIAGNOSIS — Z95811 Presence of heart assist device: Secondary | ICD-10-CM | POA: Insufficient documentation

## 2022-04-07 DIAGNOSIS — Z79899 Other long term (current) drug therapy: Secondary | ICD-10-CM | POA: Insufficient documentation

## 2022-04-07 LAB — COMPREHENSIVE METABOLIC PANEL
ALT: 23 U/L (ref 0–44)
AST: 24 U/L (ref 15–41)
Albumin: 4.1 g/dL (ref 3.5–5.0)
Alkaline Phosphatase: 100 U/L (ref 38–126)
Anion gap: 6 (ref 5–15)
BUN: 50 mg/dL — ABNORMAL HIGH (ref 8–23)
CO2: 22 mmol/L (ref 22–32)
Calcium: 9.4 mg/dL (ref 8.9–10.3)
Chloride: 111 mmol/L (ref 98–111)
Creatinine, Ser: 2.89 mg/dL — ABNORMAL HIGH (ref 0.61–1.24)
GFR, Estimated: 23 mL/min — ABNORMAL LOW (ref >60)
Glucose, Bld: 92 mg/dL (ref 70–99)
Potassium: 4.1 mmol/L (ref 3.5–5.1)
Sodium: 139 mmol/L (ref 135–145)
Total Bilirubin: 1.1 mg/dL (ref 0.3–1.2)
Total Protein: 7.7 g/dL (ref 6.5–8.1)

## 2022-04-07 LAB — TSH: TSH: 4.695 u[IU]/mL — ABNORMAL HIGH (ref 0.350–4.500)

## 2022-04-07 NOTE — Progress Notes (Signed)
Daily Session Note  Patient Details  Name: Isaac [name] MRN: [id number] Date of Birth: [birth year] Referring Provider:   Flowsheet Row Cardiac Rehab from 01/06/2022 in [facility] Cardiac and Pulmonary Rehab  Referring Provider Loralie Champagne MD       Encounter Date: 04/07/2022  Check In:  Session Check In - 04/07/22 0803       Check-In   Supervising physician immediately available to respond to emergencies See telemetry face sheet for immediately available ER MD    Staff Present Earlean Shawl, BS, ACSM CEP, Exercise Physiologist;Chantale Leugers, RN,BC,MSN;Jessica Colquitt, MA, RCEP, CCRP, CCET    Virtual Visit No    Medication changes reported     No    Fall or balance concerns reported    No    Tobacco Cessation No Change    Warm-up and Cool-down Performed on first and last piece of equipment    Resistance Training Performed Yes    VAD Patient? Yes    PAD/SET Patient? No      VAD patient   Has back up controller? Yes    Has spare charged batteries? Yes    Has battery cables? Yes    Has compatible battery clips? Yes      Pain Assessment   Currently in Pain? No/denies    Multiple Pain Sites No                Social History   Tobacco Use  Smoking Status Former  Smokeless Tobacco Never    Goals Met:  Independence with exercise equipment Exercise tolerated well No report of concerns or symptoms today  Goals Unmet:  Not Applicable  Comments: Pt able to follow exercise prescription today without complaint.  Will continue to monitor for progression.    Dr. Emily Filbert is Medical Director for [place].  Dr. Ottie Glazier is Medical Director for [facility] Pulmonary Rehabilitation.

## 2022-04-07 NOTE — Patient Instructions (Signed)
Discharge Patient Instructions  Patient Details  Name: Isaac [name] MRN: [id number] Date of Birth: Nov 10, 1950 Referring Provider:  [name], MD   Number of Visits: 32  Reason for Discharge:  Patient reached a stable level of exercise. Patient independent in their exercise. Patient has met program and personal goals.  Smoking History:  Social History   Tobacco Use  Smoking Status Former  Smokeless Tobacco Never    Diagnosis:  Heart failure, chronic systolic (HCC)  LVAD (left ventricular assist device) present [facility])  Initial Exercise Prescription:  Initial Exercise Prescription - 01/06/22 1500       Date of Initial Exercise RX and Referring Provider   Date 01/06/22    Referring Provider Loralie Champagne MD      Oxygen   Maintain Oxygen Saturation 88% or higher      REL-XR   Level 2    Speed 50    Minutes 15    METs 3.2      Biostep-RELP   Level 2    SPM 50    Minutes 15    METs 3.2      Track   Laps 34    Minutes 15    METs 2.85      Prescription Details   Frequency (times per week) 2    Duration Progress to 30 minutes of continuous aerobic without signs/symptoms of physical distress      Intensity   THRR 40-80% of Max Heartrate 103- 135    Ratings of Perceived Exertion 11-13    Perceived Dyspnea 0-4      Progression   Progression Continue to progress workloads to maintain intensity without signs/symptoms of physical distress.      Resistance Training   Training Prescription Yes    Weight 3 lb    Reps 10-15             Discharge Exercise Prescription (Final Exercise Prescription Changes):  Exercise Prescription Changes - 04/07/22 1300       Response to Exercise   Blood Pressure (Admit) --   94 doppler   Blood Pressure (Exit) --   86 doppler   Heart Rate (Admit) 72 bpm    Heart Rate (Exercise) 104 bpm    Heart Rate (Exit) 70 bpm    Rating of Perceived Exertion (Exercise) 13    Symptoms none    Duration Continue with 30  min of aerobic exercise without signs/symptoms of physical distress.    Intensity THRR unchanged      Progression   Progression Continue to progress workloads to maintain intensity without signs/symptoms of physical distress.    Average METs 2.87      Resistance Training   Training Prescription Yes    Weight 5 lb    Reps 10-15      Interval Training   Interval Training No      Recumbant Elliptical   Level 2    Minutes 15    METs 2.2      REL-XR   Level 9    Minutes 15    METs 6      Track   Laps 24    Minutes 15    METs 2.31      Home Exercise Plan   Plans to continue exercise at Home (comment)   walking, weights, walk at park   Frequency Add 2 additional days to program exercise sessions.    Initial Home Exercises Provided 01/27/22      Oxygen  Maintain Oxygen Saturation 88% or higher             Functional Capacity:  6 Minute Walk     Row Name 01/06/22 1516 04/07/22 0824       6 Minute Walk   Phase Initial --    Distance 1435 feet 1600 feet    Distance % Change -- 11.5 %    Distance Feet Change -- 165 ft    Walk Time 6 minutes 6 minutes    # of Rest Breaks 0 0    MPH 2.71 3.03    METS 3.21 3.41    RPE 10 13    Perceived Dyspnea  0 0    VO2 Peak 11.26 11.94    Symptoms No No    Resting HR 71 bpm 90 bpm    Resting BP --  84- doppler --  82 - doppler    Resting Oxygen Saturation  99 % --    Exercise Oxygen Saturation  during 6 min walk 99 % --    Max Ex. HR 93 bpm 101 bpm    Max Ex. BP --  104- doppler --  100 - doppler    2 Minute Post BP --  94-doppler --            Nutrition & Weight - Outcomes:  Pre Biometrics - 01/06/22 1515       Pre Biometrics   Height 5' 10.5" (1.791 m)    Weight 169 lb 4.8 oz (76.8 kg)    BMI (Calculated) 23.94    Single Leg Stand 30 seconds            Nutrition:  Nutrition Therapy & Goals - 02/05/22 1441       Nutrition Therapy   Diet Heart healthy, low Na, ESRD MNT    Drug/Food Interactions  Coumadin/Vit K;Statins/Certain Fruits    Protein (specify units) 90g    Fiber 30 grams    Whole Grain Foods 3 servings   Limit with ESRD   Saturated Fats 16 max. grams    Fruits and Vegetables 8 servings/day    Sodium 2 grams      Personal Nutrition Goals   Nutrition Goal ST: practice looking for and modifying recipes, follow MyPlate guidelines, include heart healthy fats with meals LT: enjoy a variety of meals that meet his needs, meet calorie and protein needs    Comments Pt 71 y.o. M admitted to cardiac rehab for chronic systolic HF. PMHx includes LVAD, HTN, CAD, A.fib, HLD, ESRD on HD, severe protein-calorie malnutrition (12/212022). Most recent RD note 10/22/21: severe malnutrition in context of chronic illness, recommended a phosphate binder for phosphorus to avoid nutrition restrictions, recommended needs 2300-2500 kcal, 135-160g pro, weight went from 78.2kg to 69.3kg during hospital stay. 1st day of rehab 76.8kg. Relevant medications include remeron, protonix, prednisone, crestor, coumadin. PYP Score: 70. Vegetables & Fruits 6/12. Breads, Grains & Cereals 4/12. Red & Processed Meat 12/12. Poultry 2/2. Fish & Shellfish 3/4. Beans, Nuts & Seeds 2/4. Milk & Dairy Foods 4/6. Toppings, Oils, Seasonings & Salt 15/20. Sweets, Snacks & Restaurant Food 14/14. Beverages 8/10. Pt would not like to "get fat" - encouraged him to get adequate calories and protein for his higher needs to build and maintain lean muscle tissue. He does not remember talking with dietitians about in hospital. He rpeorts not speaking to anyone about nutrition before, but his wife is a good support system for him. His wife will cook, read labels,  and monitor his potassium, phosphorus, and vitamin K with him. He is pescatarian and he enjoys eggs and salmon for protein sources; discussed other vegetarian protein sources to include as well. Reviewed renal MNT as well as discussed heart healthy eating. Encouraged that with coumadin he can  eat vitamin K rich foods as long as he is consistent and follows up with his care team regarding his plans. He would like to keep meals interesting while still meeting his nutritional needs; discussed some options and what he can look for.      Intervention Plan   Intervention Prescribe, educate and counsel regarding individualized specific dietary modifications aiming towards targeted core components such as weight, hypertension, lipid management, diabetes, heart failure and other comorbidities.    Expected Outcomes Short Term Goal: Understand basic principles of dietary content, such as calories, fat, sodium, cholesterol and nutrients.;Short Term Goal: A plan has been developed with personal nutrition goals set during dietitian appointment.;Long Term Goal: Adherence to prescribed nutrition plan.             Nutrition Discharge:   Education Questionnaire Score:  Knowledge Questionnaire Score - 01/06/22 1459       Knowledge Questionnaire Score   Pre Score 22/26: Angina, HF, Exercise, Nutrition             Goals reviewed with patient; copy given to patient.

## 2022-04-08 LAB — T4: T4, Total: 10 ug/dL (ref 4.5–12.0)

## 2022-04-08 LAB — T3: T3, Total: 89 ng/dL (ref 71–180)

## 2022-04-09 ENCOUNTER — Encounter: Payer: Medicare HMO | Admitting: *Deleted

## 2022-04-09 ENCOUNTER — Ambulatory Visit (HOSPITAL_COMMUNITY): Payer: Self-pay | Admitting: Pharmacist

## 2022-04-09 DIAGNOSIS — I5022 Chronic systolic (congestive) heart failure: Secondary | ICD-10-CM

## 2022-04-09 DIAGNOSIS — Z95811 Presence of heart assist device: Secondary | ICD-10-CM

## 2022-04-09 LAB — POCT INR: INR: 2.1 (ref 2.0–3.0)

## 2022-04-09 NOTE — Progress Notes (Signed)
Daily Session Note  Patient Details  Name: Isaac [name] MRN: [id number] Date of Birth: December 09, 1950 Referring Provider:   Flowsheet Row Cardiac Rehab from 01/06/2022 in [facility] Cardiac and Pulmonary Rehab  Referring Provider Loralie Champagne MD       Encounter Date: 04/09/2022  Check In:  Session Check In - 04/09/22 0926       Check-In   Supervising physician immediately available to respond to emergencies See telemetry face sheet for immediately available ER MD    Location [facility]-Cardiac & Pulmonary Rehab    Staff Present Heath Lark, RN, BSN, CCRP;Kristen Coble, RN,BC,MSN;Jessica [place], [place], RCEP, CCRP, CCET    Virtual Visit No    Medication changes reported     No    Fall or balance concerns reported    No    Warm-up and Cool-down Performed on first and last piece of equipment    Resistance Training Performed Yes    VAD Patient? Yes    PAD/SET Patient? No      VAD patient   Has back up controller? Yes    Has spare charged batteries? Yes    Has battery cables? Yes    Has compatible battery clips? Yes      Pain Assessment   Currently in Pain? No/denies                Social History   Tobacco Use  Smoking Status Former  Smokeless Tobacco Never    Goals Met:  Independence with exercise equipment Exercise tolerated well No report of concerns or symptoms today  Goals Unmet:  Not Applicable  Comments: Pt able to follow exercise prescription today without complaint.  Will continue to monitor for progression.    Dr. Emily Filbert is Medical Director for [place].  Dr. Ottie Glazier is Medical Director for [facility] Pulmonary Rehabilitation.

## 2022-04-14 DIAGNOSIS — I5022 Chronic systolic (congestive) heart failure: Secondary | ICD-10-CM | POA: Diagnosis not present

## 2022-04-14 NOTE — Progress Notes (Signed)
Daily Session Note  Patient Details  Name: Isaac [name] MRN: [id number] Date of Birth: [birth year] Referring Provider:   Flowsheet Row Cardiac Rehab from 01/06/2022 in [facility] Cardiac and Pulmonary Rehab  Referring Provider Loralie Champagne MD       Encounter Date: 04/14/2022  Check In:  Session Check In - 04/14/22 0758       Check-In   Supervising physician immediately available to respond to emergencies See telemetry face sheet for immediately available ER MD    Location [facility]-Cardiac & Pulmonary Rehab    Staff Present Earlean Shawl, BS, ACSM CEP, Exercise Physiologist;Berlin Mokry, RN,BC,MSN;Jessica [place], MA, RCEP, CCRP, CCET    Virtual Visit No    Medication changes reported     No    Fall or balance concerns reported    No    Tobacco Cessation No Change    Warm-up and Cool-down Performed on first and last piece of equipment    Resistance Training Performed Yes    VAD Patient? Yes    PAD/SET Patient? No      VAD patient   Has back up controller? Yes    Has spare charged batteries? Yes    Has battery cables? Yes    Has compatible battery clips? Yes      PAD/SET Patient   Completed foot check today? No    Open wounds to report? No      Pain Assessment   Currently in Pain? No/denies    Multiple Pain Sites No                Social History   Tobacco Use  Smoking Status Former  Smokeless Tobacco Never    Goals Met:  Independence with exercise equipment Exercise tolerated well No report of concerns or symptoms today Strength training completed today  Goals Unmet:  Not Applicable  Comments: Pt able to follow exercise prescription today without complaint.  Will continue to monitor for progression.    Dr. Emily Filbert is Medical Director for [place].  Dr. Ottie Glazier is Medical Director for [facility] Pulmonary Rehabilitation.

## 2022-04-16 ENCOUNTER — Ambulatory Visit (HOSPITAL_COMMUNITY): Payer: Self-pay | Admitting: Pharmacist

## 2022-04-16 ENCOUNTER — Encounter: Payer: Medicare HMO | Admitting: *Deleted

## 2022-04-16 DIAGNOSIS — I5022 Chronic systolic (congestive) heart failure: Secondary | ICD-10-CM

## 2022-04-16 DIAGNOSIS — Z95811 Presence of heart assist device: Secondary | ICD-10-CM

## 2022-04-16 LAB — POCT INR: INR: 2.6 (ref 2.0–3.0)

## 2022-04-16 NOTE — Progress Notes (Signed)
Daily Session Note  Patient Details  Name: Isaac [name] MRN: [id number] Date of Birth: 1951-06-09 Referring Provider:   Flowsheet Row Cardiac Rehab from 01/06/2022 in [facility] Cardiac and Pulmonary Rehab  Referring Provider Loralie Champagne MD       Encounter Date: 04/16/2022  Check In:  Session Check In - 04/16/22 0803       Check-In   Supervising physician immediately available to respond to emergencies See telemetry face sheet for immediately available ER MD    Location [facility]-Cardiac & Pulmonary Rehab    Staff Present Heath Lark, RN, BSN, CCRP;Jessica [place], MA, RCEP, CCRP, Marylynn Pearson, MS, ASCM CEP, Exercise Physiologist    Virtual Visit No    Medication changes reported     No    Fall or balance concerns reported    No    Warm-up and Cool-down Performed on first and last piece of equipment    Resistance Training Performed Yes    VAD Patient? Yes    PAD/SET Patient? No      VAD patient   Has back up controller? Yes    Has spare charged batteries? Yes    Has battery cables? Yes    Has compatible battery clips? Yes      Pain Assessment   Currently in Pain? No/denies                Social History   Tobacco Use  Smoking Status Former  Smokeless Tobacco Never    Goals Met:  Independence with exercise equipment Exercise tolerated well No report of concerns or symptoms today  Goals Unmet:  Not Applicable  Comments: Pt able to follow exercise prescription today without complaint.  Will continue to monitor for progression.    Dr. Emily Filbert is Medical Director for [place].  Dr. Ottie Glazier is Medical Director for [facility] Pulmonary Rehabilitation.

## 2022-04-21 ENCOUNTER — Encounter: Payer: Medicare HMO | Attending: Cardiology | Admitting: *Deleted

## 2022-04-21 DIAGNOSIS — I5022 Chronic systolic (congestive) heart failure: Secondary | ICD-10-CM

## 2022-04-21 DIAGNOSIS — Z95811 Presence of heart assist device: Secondary | ICD-10-CM | POA: Diagnosis present

## 2022-04-21 NOTE — Progress Notes (Signed)
Daily Session Note  Patient Details  Name: Isaac [name] MRN: [id number] Date of Birth: [birth year] Referring Provider:   Flowsheet Row Cardiac Rehab from 01/06/2022 in [facility] Cardiac and Pulmonary Rehab  Referring Provider Loralie Champagne MD       Encounter Date: 04/21/2022  Check In:  Session Check In - 04/21/22 0834       Check-In   Supervising physician immediately available to respond to emergencies See telemetry face sheet for immediately available ER MD    Location [facility]-Cardiac & Pulmonary Rehab    Staff Present Heath Lark, RN, BSN, Jacklynn Bue, MS, ASCM CEP, Exercise Physiologist;Kelly Amedeo Plenty, BS, ACSM CEP, Exercise Physiologist    Virtual Visit No    Medication changes reported     No    Fall or balance concerns reported    No    Warm-up and Cool-down Performed on first and last piece of equipment    Resistance Training Performed Yes    VAD Patient? Yes    PAD/SET Patient? No      VAD patient   Has back up controller? Yes    Has spare charged batteries? Yes    Has battery cables? Yes    Has compatible battery clips? Yes      Pain Assessment   Currently in Pain? No/denies                Social History   Tobacco Use  Smoking Status Former  Smokeless Tobacco Never    Goals Met:  Independence with exercise equipment Exercise tolerated well No report of concerns or symptoms today  Goals Unmet:  Not Applicable  Comments: Pt able to follow exercise prescription today without complaint.  Will continue to monitor for progression.    Dr. Emily Filbert is Medical Director for [place].  Dr. Ottie Glazier is Medical Director for [facility] Pulmonary Rehabilitation.

## 2022-04-23 ENCOUNTER — Ambulatory Visit (HOSPITAL_COMMUNITY): Payer: Self-pay | Admitting: Pharmacist

## 2022-04-23 ENCOUNTER — Encounter: Payer: Medicare HMO | Admitting: *Deleted

## 2022-04-23 DIAGNOSIS — Z95811 Presence of heart assist device: Secondary | ICD-10-CM

## 2022-04-23 DIAGNOSIS — I5022 Chronic systolic (congestive) heart failure: Secondary | ICD-10-CM

## 2022-04-23 LAB — POCT INR: INR: 2.9 (ref 2.0–3.0)

## 2022-04-23 NOTE — Progress Notes (Signed)
LVAD INR 

## 2022-04-23 NOTE — Progress Notes (Signed)
Daily Session Note  Patient Details  Name: Isaac [name] MRN: [id number] Date of Birth: Mar 15, 1951 Referring Provider:   Flowsheet Row Cardiac Rehab from 01/06/2022 in [facility] Cardiac and Pulmonary Rehab  Referring Provider Loralie Champagne MD       Encounter Date: 04/23/2022  Check In:  Session Check In - 04/23/22 0807       Check-In   Supervising physician immediately available to respond to emergencies See telemetry face sheet for immediately available ER MD    Location [facility]-Cardiac & Pulmonary Rehab    Staff Present Heath Lark, RN, BSN, Jacklynn Bue, MS, ASCM CEP, Exercise Physiologist;Melissa Caiola, RDN, LDN    Virtual Visit No    Medication changes reported     No    Fall or balance concerns reported    No    Warm-up and Cool-down Performed on first and last piece of equipment    Resistance Training Performed Yes    VAD Patient? Yes    PAD/SET Patient? No      VAD patient   Has back up controller? Yes    Has spare charged batteries? Yes    Has battery cables? Yes    Has compatible battery clips? Yes      PAD/SET Patient   Completed foot check today? No      Pain Assessment   Currently in Pain? No/denies                Social History   Tobacco Use  Smoking Status Former  Smokeless Tobacco Never    Goals Met:  Independence with exercise equipment Exercise tolerated well Personal goals reviewed No report of concerns or symptoms today  Goals Unmet:  Not Applicable  Comments:  Isaac [name] graduated today from  rehab with 36 sessions completed.  Details of the patient's exercise prescription and what He needs to do in order to continue the prescription and progress were discussed with patient.  Patient was given a copy of prescription and goals.  Patient verbalized understanding.  Isaac [name] plans to continue to exercise by returning to the [facility].    Dr. Emily Filbert is Medical Director for [place].  Dr. Ottie Glazier is  Medical Director for [facility] Pulmonary Rehabilitation.

## 2022-04-23 NOTE — Progress Notes (Signed)
Cardiac Individual Treatment Plan  Patient Details  Name: Isaac [name] MRN: [id number] Date of Birth: 1950/12/29 Referring Provider:   Flowsheet Row Cardiac Rehab from 01/06/2022 in [facility] Cardiac and Pulmonary Rehab  Referring Provider Loralie Champagne MD       Initial Encounter Date:  Flowsheet Row Cardiac Rehab from 01/06/2022 in [facility] Cardiac and Pulmonary Rehab  Date 01/06/22       Visit Diagnosis: Heart failure, chronic systolic ([place])  LVAD (left ventricular assist device) present ([place])  Patient's Home Medications on Admission:  Current Outpatient Medications:    albuterol (VENTOLIN HFA) 108 (90 Base) MCG/ACT inhaler, Inhale 2 puffs into the lungs every 6 (six) hours as needed. (Patient not taking: Reported on 12/09/2021), Disp: , Rfl:    amiodarone (PACERONE) 200 MG tablet, Take 0.5 tablets (100 mg total) by mouth daily., Disp: 30 tablet, Rfl: 6   midodrine (PROAMATINE) 10 MG tablet, Take 1 tablet (10 mg total) by mouth 3 (three) times a week. Take 10 mg (1 tablet) Monday, Wednesday, Friday 30 minutes before hemodialysis session, Disp: 60 tablet, Rfl: 2   mirtazapine (REMERON) 7.5 MG tablet, Take 1 tablet (7.5 mg total) by mouth at bedtime., Disp: 30 tablet, Rfl: 6   Multiple Vitamins-Minerals (CERTAVITE/ANTIOXIDANTS) TABS, Take 1 tablet by mouth at bedtime. (Patient not taking: Reported on 04/02/2022), Disp: 30 tablet, Rfl: 11   ondansetron (ZOFRAN) 4 MG tablet, Take 1 tablet (4 mg total) by mouth every 8 (eight) hours as needed for nausea or vomiting. (Patient not taking: Reported on 01/20/2022), Disp: 40 tablet, Rfl: 3   pantoprazole (PROTONIX) 40 MG tablet, Take 1 tablet (40 mg total) by mouth daily., Disp: 30 tablet, Rfl: 6   rosuvastatin (CRESTOR) 10 MG tablet, Take 1 tablet (10 mg total) by mouth daily., Disp: 30 tablet, Rfl: 6   sildenafil (REVATIO) 20 MG tablet, Take 1 tablet (20 mg total) by mouth 3 (three) times daily., Disp: 90 tablet, Rfl: 6   warfarin (COUMADIN) 2 MG  tablet, Take 4 mg (2 tabs) every Monday/Wednesday/Friday and 2 mg (1 tab) all other days or as directed by the [facility]., Disp: 120 tablet, Rfl: 11  Past Medical History: Past Medical History:  Diagnosis Date   Arrhythmia    atrial fibrillation   CHF (congestive heart failure) ([place])    Chronic kidney disease    Coronary artery disease    Hyperlipidemia    Hypertension    Myocardial infarct ([place])     Tobacco Use: Social History   Tobacco Use  Smoking Status Former  Smokeless Tobacco Never    Labs: Review Flowsheet  More data exists      Latest Ref Rng & Units 10/18/2021 10/19/2021 10/20/2021 10/21/2021 10/22/2021  Labs for ITP Cardiac and Pulmonary Rehab  O2 Saturation % 54.2  50.9  57.5  58.5  86.5      Exercise Target Goals: Exercise Program Goal: Individual exercise prescription set using results from initial 6 min walk test and THRR while considering  patient's activity barriers and safety.   Exercise Prescription Goal: Initial exercise prescription builds to 30-45 minutes a day of aerobic activity, 2-3 days per week.  Home exercise guidelines will be given to patient during program as part of exercise prescription that the participant will acknowledge.   Education: Aerobic Exercise: - Group verbal and visual presentation on the components of exercise prescription. Introduces F.I.T.T principle from ACSM for exercise prescriptions.  Reviews F.I.T.T. principles of aerobic exercise including progression. Written material  given at graduation. Flowsheet Row Cardiac Rehab from 03/26/2022 in [facility] Cardiac and Pulmonary Rehab  Date 03/26/22  Educator [facility]  Instruction Review Code 1- Verbalizes Understanding       Education: Resistance Exercise: - Group verbal and visual presentation on the components of exercise prescription. Introduces F.I.T.T principle from ACSM for exercise prescriptions  Reviews F.I.T.T. principles of resistance exercise including progression.  Written material given at graduation. Flowsheet Row Cardiac Rehab from 03/26/2022 in [facility] Cardiac and Pulmonary Rehab  Date 01/29/22  Educator [facility]  Instruction Review Code 1- Verbalizes Understanding        Education: Exercise & Equipment Safety: - Individual verbal instruction and demonstration of equipment use and safety with use of the equipment. Flowsheet Row Cardiac Rehab from 03/26/2022 in [facility] Cardiac and Pulmonary Rehab  Education need identified 01/06/22  Date 01/06/22  Educator [place]  Instruction Review Code 2- Demonstrated Understanding       Education: Exercise Physiology & General Exercise Guidelines: - Group verbal and written instruction with models to review the exercise physiology of the cardiovascular system and associated critical values. Provides general exercise guidelines with specific guidelines to those with heart or lung disease.  Flowsheet Row Cardiac Rehab from 03/26/2022 in [facility] Cardiac and Pulmonary Rehab  Education need identified 01/06/22  Date 01/15/22  Educator [facility]  Instruction Review Code 1- [employer] Understanding       Education: Flexibility, Balance, Mind/Body Relaxation: - Group verbal and visual presentation with interactive activity on the components of exercise prescription. Introduces F.I.T.T principle from ACSM for exercise prescriptions. Reviews F.I.T.T. principles of flexibility and balance exercise training including progression. Also discusses the mind body connection.  Reviews various relaxation techniques to help reduce and manage stress (i.e. Deep breathing, progressive muscle relaxation, and visualization). Balance handout provided to take home. Written material given at graduation. Flowsheet Row Cardiac Rehab from 03/26/2022 in [facility] Cardiac and Pulmonary Rehab  Date 02/05/22  Educator [facility]  Instruction Review Code 1- Verbalizes Understanding       Activity Barriers & Risk Stratification:  Activity Barriers & Cardiac Risk Stratification -  01/06/22 1515       Activity Barriers & Cardiac Risk Stratification   Activity Barriers Joint Problems;Muscular Weakness;Decreased Ventricular Function;Other (comment)    Comments Right hand weakness, LVAD    Cardiac Risk Stratification High             6 Minute Walk:  6 Minute Walk     Row Name 01/06/22 1516 04/07/22 0824       6 Minute Walk   Phase Initial --    Distance 1435 feet 1600 feet    Distance % Change -- 11.5 %    Distance Feet Change -- 165 ft    Walk Time 6 minutes 6 minutes    # of Rest Breaks 0 0    MPH 2.71 3.03    METS 3.21 3.41    RPE 10 13    Perceived Dyspnea  0 0    VO2 Peak 11.26 11.94    Symptoms No No    Resting HR 71 bpm 90 bpm    Resting BP --  84- doppler --  82 - doppler    Resting Oxygen Saturation  99 % --    Exercise Oxygen Saturation  during 6 min walk 99 % --    Max Ex. HR 93 bpm 101 bpm    Max Ex. BP --  104- doppler --  100 - doppler  2 Minute Post BP --  94-doppler --             Oxygen Initial Assessment:   Oxygen Re-Evaluation:   Oxygen Discharge (Final Oxygen Re-Evaluation):   Initial Exercise Prescription:  Initial Exercise Prescription - 01/06/22 1500       Date of Initial Exercise RX and Referring Provider   Date 01/06/22    Referring Provider Loralie Champagne MD      Oxygen   Maintain Oxygen Saturation 88% or higher      REL-XR   Level 2    Speed 50    Minutes 15    METs 3.2      Biostep-RELP   Level 2    SPM 50    Minutes 15    METs 3.2      Track   Laps 34    Minutes 15    METs 2.85      Prescription Details   Frequency (times per week) 2    Duration Progress to 30 minutes of continuous aerobic without signs/symptoms of physical distress      Intensity   THRR 40-80% of Max Heartrate 103- 135    Ratings of Perceived Exertion 11-13    Perceived Dyspnea 0-4      Progression   Progression Continue to progress workloads to maintain intensity without signs/symptoms of physical  distress.      Resistance Training   Training Prescription Yes    Weight 3 lb    Reps 10-15             Perform Capillary Blood Glucose checks as needed.  Exercise Prescription Changes:   Exercise Prescription Changes     Row Name 01/06/22 1500 01/27/22 0800 01/27/22 1500 02/09/22 0900 02/24/22 0900     Response to Exercise   Blood Pressure (Admit) --  84- doppler -- --  88 dopplar --  88 doppler --  86 doppler   Blood Pressure (Exercise) --  104- doppler -- -- -- --   Blood Pressure (Exit) --  94 - doppler -- --  84 dopplar --  102 doppler --  82 doppler   Heart Rate (Admit) 71 bpm -- 68 bpm 69 bpm 81 bpm   Heart Rate (Exercise) 93 bpm -- 92 bpm 100 bpm 101 bpm   Heart Rate (Exit) 79 bpm -- 81 bpm 84 bpm 73 bpm   Oxygen Saturation (Admit) 99 % -- -- -- --   Oxygen Saturation (Exercise) 99 % -- -- -- --   Rating of Perceived Exertion (Exercise) 10 -- 14 14 15    Perceived Dyspnea (Exercise) 0 -- -- -- --   Symptoms none -- none none none   Comments walk test results -- -- -- --   Duration -- -- Continue with 30 min of aerobic exercise without signs/symptoms of physical distress. Continue with 30 min of aerobic exercise without signs/symptoms of physical distress. Continue with 30 min of aerobic exercise without signs/symptoms of physical distress.   Intensity -- -- THRR unchanged THRR unchanged THRR unchanged     Progression   Progression -- -- Continue to progress workloads to maintain intensity without signs/symptoms of physical distress. Continue to progress workloads to maintain intensity without signs/symptoms of physical distress. Continue to progress workloads to maintain intensity without signs/symptoms of physical distress.   Average METs -- -- 3.31 3.17 3.24     Resistance Training   Training Prescription -- -- Yes Yes Yes   Weight -- --  3 lb 5 lb 5 lb   Reps -- -- 10-15 10-15 10-15     Interval Training   Interval Training -- -- No No No     Recumbant  Elliptical   Level -- -- -- -- 2   Minutes -- -- -- -- 15   METs -- -- -- -- 1.9     REL-XR   Level -- -- 2 5 6    Minutes -- -- 15 15 15    METs -- -- 4.1 5.1 5.2     Biostep-RELP   Level -- -- 3 4 4    Minutes -- -- 15 15 15    METs -- -- 3 3 3      Track   Laps -- -- 34 38 40   Minutes -- -- 15 15 15    METs -- -- 2.85 3.07 3.18     Home Exercise Plan   Plans to continue exercise at -- Home (comment)  walking, weights, walk at park Home (comment)  walking, weights, walk at park Home (comment)  walking, weights, walk at park Home (comment)  walking, weights, walk at park   Frequency -- Add 2 additional days to program exercise sessions. Add 2 additional days to program exercise sessions. Add 2 additional days to program exercise sessions. Add 2 additional days to program exercise sessions.   Initial Home Exercises Provided -- 01/27/22 01/27/22 01/27/22 01/27/22     Oxygen   Maintain Oxygen Saturation -- -- 88% or higher 88% or higher 88% or higher    Row Name 03/11/22 0900 03/25/22 0700 04/07/22 1300         Response to Exercise   Blood Pressure (Admit) --  98 doppler --  84 doppler --  94 doppler     Blood Pressure (Exit) --  84 doppler --  92 doppler --  86 doppler     Heart Rate (Admit) 65 bpm 71 bpm 72 bpm     Heart Rate (Exercise) 99 bpm 102 bpm 104 bpm     Heart Rate (Exit) 70 bpm 70 bpm 70 bpm     Rating of Perceived Exertion (Exercise) 13 12 13      Symptoms none none none     Duration Continue with 30 min of aerobic exercise without signs/symptoms of physical distress. Continue with 30 min of aerobic exercise without signs/symptoms of physical distress. Continue with 30 min of aerobic exercise without signs/symptoms of physical distress.     Intensity THRR unchanged THRR unchanged THRR unchanged       Progression   Progression Continue to progress workloads to maintain intensity without signs/symptoms of physical distress. Continue to progress workloads to maintain  intensity without signs/symptoms of physical distress. Continue to progress workloads to maintain intensity without signs/symptoms of physical distress.     Average METs 3.11 3.59 2.87       Resistance Training   Training Prescription Yes Yes Yes     Weight 5 lb 5 lb 5 lb     Reps 10-15 10-15 10-15       Interval Training   Interval Training No No No       Recumbant Elliptical   Level 3 2.5 2     Minutes 15 15 15      METs 2.2 2.1 2.2       REL-XR   Level 5  up to 7 for a little bit 7 9     Minutes 15 15 15      METs  4.8 5.3 6       Track   Laps 38 43 24     Minutes 15 15 15      METs 3.07 3.34 2.31       Home Exercise Plan   Plans to continue exercise at Home (comment)  walking, weights, walk at park Home (comment)  walking, weights, walk at park Home (comment)  walking, weights, walk at park     Frequency Add 2 additional days to program exercise sessions. Add 2 additional days to program exercise sessions. Add 2 additional days to program exercise sessions.     Initial Home Exercises Provided 01/27/22 01/27/22 01/27/22       Oxygen   Maintain Oxygen Saturation 88% or higher 88% or higher 88% or higher              Exercise Comments:   Exercise Comments     Row Name 01/13/22 0800 04/23/22 0808         Exercise Comments First full day of exercise!  Patient was oriented to gym and equipment including functions, settings, policies, and procedures.  Patient's individual exercise prescription and treatment plan were reviewed.  All starting workloads were established based on the results of the 6 minute walk test done at initial orientation visit.  The plan for exercise progression was also introduced and progression will be customized based on patient's performance and goals. Devaun graduated today from  rehab with 36 sessions completed.  Details of the patient's exercise prescription and what He needs to do in order to continue the prescription and progress were discussed  with patient.  Patient was given a copy of prescription and goals.  Patient verbalized understanding.  Ngoc plans to continue to exercise by returning to the [facility].               Exercise Goals and Review:   Exercise Goals     Row Name 01/06/22 1538             Exercise Goals   Increase Physical Activity Yes       Intervention Provide advice, education, support and counseling about physical activity/exercise needs.;Develop an individualized exercise prescription for aerobic and resistive training based on initial evaluation findings, risk stratification, comorbidities and participant's personal goals.       Expected Outcomes Short Term: Attend rehab on a regular basis to increase amount of physical activity.;Long Term: Exercising regularly at least 3-5 days a week.;Long Term: Add in home exercise to make exercise part of routine and to increase amount of physical activity.       Increase Strength and Stamina Yes       Intervention Provide advice, education, support and counseling about physical activity/exercise needs.;Develop an individualized exercise prescription for aerobic and resistive training based on initial evaluation findings, risk stratification, comorbidities and participant's personal goals.       Expected Outcomes Short Term: Increase workloads from initial exercise prescription for resistance, speed, and METs.;Short Term: Perform resistance training exercises routinely during rehab and add in resistance training at home;Long Term: Improve cardiorespiratory fitness, muscular endurance and strength as measured by increased METs and functional capacity (6MWT)       Able to understand and use rate of perceived exertion (RPE) scale Yes       Intervention Provide education and explanation on how to use RPE scale       Expected Outcomes Short Term: Able to use RPE daily in rehab to express subjective intensity level;Long  Term:  Able to use RPE to guide intensity level when  exercising independently       Able to understand and use Dyspnea scale Yes       Intervention Provide education and explanation on how to use Dyspnea scale       Expected Outcomes Short Term: Able to use Dyspnea scale daily in rehab to express subjective sense of shortness of breath during exertion;Long Term: Able to use Dyspnea scale to guide intensity level when exercising independently       Knowledge and understanding of Target Heart Rate Range (THRR) Yes       Intervention Provide education and explanation of THRR including how the numbers were predicted and where they are located for reference       Expected Outcomes Short Term: Able to use daily as guideline for intensity in rehab;Long Term: Able to use THRR to govern intensity when exercising independently;Short Term: Able to state/look up THRR       Able to check pulse independently Yes       Intervention Review the importance of being able to check your own pulse for safety during independent exercise;Provide education and demonstration on how to check pulse in carotid and radial arteries.       Expected Outcomes Short Term: Able to explain why pulse checking is important during independent exercise;Long Term: Able to check pulse independently and accurately       Understanding of Exercise Prescription Yes       Intervention Provide education, explanation, and written materials on patient's individual exercise prescription       Expected Outcomes Short Term: Able to explain program exercise prescription;Long Term: Able to explain home exercise prescription to exercise independently                Exercise Goals Re-Evaluation :  Exercise Goals Re-Evaluation     Row Name 01/13/22 0800 01/27/22 0817 02/09/22 0953 02/17/22 0753 02/24/22 0922     Exercise Goal Re-Evaluation   Exercise Goals Review Increase Physical Activity;Able to understand and use rate of perceived exertion (RPE) scale;Knowledge and understanding of Target Heart  Rate Range (THRR);Understanding of Exercise Prescription;Increase Strength and Stamina;Able to understand and use Dyspnea scale;Able to check pulse independently Increase Physical Activity;Increase Strength and Stamina;Able to understand and use rate of perceived exertion (RPE) scale;Able to understand and use Dyspnea scale;Knowledge and understanding of Target Heart Rate Range (THRR);Able to check pulse independently;Understanding of Exercise Prescription Increase Physical Activity;Increase Strength and Stamina;Understanding of Exercise Prescription Increase Physical Activity;Increase Strength and Stamina;Understanding of Exercise Prescription Increase Physical Activity;Increase Strength and Stamina;Understanding of Exercise Prescription   Comments Reviewed RPE and dyspnea scales, THR and program prescription with pt today.  Pt voiced understanding and was given a copy of goals to take home. Reviewed home exercise with pt today.  Pt plans to walk at park for exercise.  He also has access to weight that he uses at home.  Reviewed THR, pulse, RPE, sign and symptoms, pulse oximetery and when to call 911 or MD.  Also discussed weather considerations and indoor options.  Pt voiced understanding. Isaac [name] is doing well in rehab. He has increased to 38 laps on the track! He also is now working at level 5 on the XR, working at 5.1 METS. He is now using 5 lb handweights. Will continue to monitor. Isaac [name] is doing well in rehab. He is trying to find a place to go for after rehab to find someone that will push.  We talked about the [facility] as a place for getting some training for planning his workouts.  He is going to stop by to talk to them one day.  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 Isaac [name] is doing well in rehab. He improved to level 6 on the XR machine. He also increased to 40 laps on the track. Isaac [name] has tolerated using 5 lb hand weights for exercise. We will continue to monitor his progress in the  program.   Expected Outcomes Short: Use RPE daily to regulate intensity. Long: Follow program prescription in THR. Short: Continue to walk on off days at home Long: Continue to improve stamina. Short: Continue to increase laps on track Long: Continue to increase overall MET level Short: Conitnue to decide where to go after graduation Long: Conitnue to exercise independently Short: Continue to increase laps on track. Long: Continue to increase overall MET levels    Row Name 03/11/22 0953 03/25/22 0723 03/26/22 0810 04/07/22 1334 04/16/22 0821     Exercise Goal Re-Evaluation   Exercise Goals Review Increase Physical Activity;Increase Strength and Stamina;Understanding of Exercise Prescription Increase Physical Activity;Increase Strength and Stamina;Understanding of Exercise Prescription Increase Physical Activity;Increase Strength and Stamina;Understanding of Exercise Prescription Increase Physical Activity;Increase Strength and Stamina;Understanding of Exercise Prescription Increase Physical Activity;Increase Strength and Stamina;Understanding of Exercise Prescription   Comments Isaac [name] continues to do well in rehab. He increased to level 3 on the REL and has been staying steady around 35-40 laps on the track. He tried level 7 on the XR for the short time. We will continue to monitor. Isaac [name] is doing well in rehab. He is consistently getting above 40 laps on the track and has gotten as many as 43 laps. He also is up to level 7 on the XR. He increased his overall average MET level to 3.59 METs as well. We will continue to monitor Isaac [name]'s progress in the program. Isaac [name] is using weights (10lbs) at home - similar to rehab as well as walking (around the neighborhood) - about 2 miles. 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 Isaac [name] is doing well in rehab and i getting close to graduating. He improved on his post 6MWT by 165 ft. He also improved to level 9 on the XR. We will  continue to monitor his progress in the program. Isaac [name] graduates next week.  He is planning to go to the [facility] on Tuesdays and Thursdays to stick with his schedule.  He has enjoyed the program and feeling better overall.   Expected Outcomes Short: Continue to work up load on XR Long: Continue to build up overall stamina and strength Short: Continue to get above 40 laps on the track. Long: Continue to increase overall MET level. ST: Continue to exercise outside of rehab LT: Conitnue to exercise independently Short: Continue to push for more laps on the track. Long: Continue to improve strength and stamina. Continue to exercise independently            Discharge Exercise Prescription (Final Exercise Prescription Changes):  Exercise Prescription Changes - 04/07/22 1300       Response to Exercise   Blood Pressure (Admit) --   94 doppler   Blood Pressure (Exit) --   86 doppler   Heart Rate (Admit) 72 bpm    Heart Rate (Exercise) 104 bpm    Heart Rate (Exit) 70 bpm    Rating  of Perceived Exertion (Exercise) 13    Symptoms none    Duration Continue with 30 min of aerobic exercise without signs/symptoms of physical distress.    Intensity THRR unchanged      Progression   Progression Continue to progress workloads to maintain intensity without signs/symptoms of physical distress.    Average METs 2.87      Resistance Training   Training Prescription Yes    Weight 5 lb    Reps 10-15      Interval Training   Interval Training No      Recumbant Elliptical   Level 2    Minutes 15    METs 2.2      REL-XR   Level 9    Minutes 15    METs 6      Track   Laps 24    Minutes 15    METs 2.31      Home Exercise Plan   Plans to continue exercise at Home (comment)   walking, weights, walk at park   Frequency Add 2 additional days to program exercise sessions.    Initial Home Exercises Provided 01/27/22      Oxygen   Maintain Oxygen Saturation 88% or higher             Nutrition:   Target Goals: Understanding of nutrition guidelines, daily intake of sodium <1524m, cholesterol <2020m calories 30% from fat and 7% or less from saturated fats, daily to have 5 or more servings of fruits and vegetables.  Education: All About Nutrition: -Group instruction provided by verbal, written material, interactive activities, discussions, models, and posters to present general guidelines for heart healthy nutrition including fat, fiber, MyPlate, the role of sodium in heart healthy nutrition, utilization of the nutrition label, and utilization of this knowledge for meal planning. Follow up email sent as well. Written material given at graduation. Flow[facility]  Education need identified 01/06/22       Biometrics:  Pre Biometrics - 01/06/22 1515       Pre Biometrics   Height 5' 10.5" (1.791 m)    Weight 169 lb 4.8 oz (76.8 kg)    BMI (Calculated) 23.94    Single Leg Stand 30 seconds              Nutrition Therapy Plan and Nutrition Goals:  Nutrition Therapy & Goals - 02/05/22 1441       Nutrition Therapy   Diet Heart healthy, low Na, ESRD MNT    Drug/Food Interactions Coumadin/Vit K;Statins/Certain Fruits    Protein (specify units) 90g    Fiber 30 grams    Whole Grain Foods 3 servings   Limit with ESRD   Saturated Fats 16 max. grams    Fruits and Vegetables 8 servings/day    Sodium 2 grams      Personal Nutrition Goals   Nutrition Goal ST: practice looking for and modifying recipes, follow MyPlate guidelines, include heart healthy fats with meals LT: enjoy a variety of meals that meet his needs, meet calorie and protein needs    Comments Pt [age].o. M admitted to cardiac rehab for chronic systolic HF. PMHx includes LVAD, HTN, CAD, A.fib, HLD, ESRD on HD, severe protein-calorie malnutrition (12/212022). Most recent RD note 10/22/21: severe malnutrition in context of chronic illness, recommended a phosphate  binder for phosphorus to avoid nutrition restrictions, recommended needs 2300-2500 kcal, 135-160g pro, weight went from 78.2kg to 69.3kg  during hospital stay. 1st day of rehab 76.8kg. Relevant medications include remeron, protonix, prednisone, crestor, coumadin. PYP Score: 70. Vegetables & Fruits 6/12. Breads, Grains & Cereals 4/12. Red & Processed Meat 12/12. Poultry 2/2. Fish & Shellfish 3/4. Beans, Nuts & Seeds 2/4. Milk & Dairy Foods 4/6. Toppings, Oils, Seasonings & Salt 15/20. Sweets, Snacks & Restaurant Food 14/14. Beverages 8/10. Pt would not like to "get fat" - encouraged him to get adequate calories and protein for his higher needs to build and maintain lean muscle tissue. He does not remember talking with dietitians about in hospital. He rpeorts not speaking to anyone about nutrition before, but his wife is a good support system for him. His wife will cook, read labels, and monitor his potassium, phosphorus, and vitamin K with him. He is pescatarian and he enjoys eggs and salmon for protein sources; discussed other vegetarian protein sources to include as well. Reviewed renal MNT as well as discussed heart healthy eating. Encouraged that with coumadin he can eat vitamin K rich foods as long as he is consistent and follows up with his care team regarding his plans. He would like to keep meals interesting while still meeting his nutritional needs; discussed some options and what he can look for.      Intervention Plan   Intervention Prescribe, educate and counsel regarding individualized specific dietary modifications aiming towards targeted core components such as weight, hypertension, lipid management, diabetes, heart failure and other comorbidities.    Expected Outcomes Short Term Goal: Understand basic principles of dietary content, such as calories, fat, sodium, cholesterol and nutrients.;Short Term Goal: A plan has been developed with personal nutrition goals set during dietitian appointment.;Long  Term Goal: Adherence to prescribed nutrition plan.             Nutrition Assessments:  MEDIFICTS Score Key: ?70 Need to make dietary changes  40-70 Heart Healthy Diet ? 40 Therapeutic Level Cholesterol Diet  Flowsheet Row Cardiac Rehab from 04/14/2022 in [facility] Cardiac and Pulmonary Rehab  Picture Your Plate Total Score on Admission 70  Picture Your Plate Total Score on Discharge 74      Picture Your Plate Scores: <96 Unhealthy dietary pattern with much room for improvement. 41-50 Dietary pattern unlikely to meet recommendations for good health and room for improvement. 51-60 More healthful dietary pattern, with some room for improvement.  >60 Healthy dietary pattern, although there may be some specific behaviors that could be improved.    Nutrition Goals Re-Evaluation:  Nutrition Goals Re-Evaluation     Row Name 01/27/22 [phone] 02/17/22 0759 03/26/22 0808 04/16/22 7989       Goals   Nutrition Goal Meet with dietitian. ST: practice looking for and modifying recipes, follow MyPlate guidelines, include heart healthy fats with meals LT: enjoy a variety of meals that meet his needs, meet calorie and protein needs ST: practice looking for and modifying recipes, follow MyPlate guidelines, include heart healthy fats with meals LT: enjoy a variety of meals that meet his needs, meet calorie and protein needs ST: Continue to try new recipes LT: enjoy a variety of meals that meet his needs, meet calorie and protein needs    Comment Isaac [name] has not met with dietitian yet.  He was not sure if much help given vegetarian diet, but I encouraged him to check in to make sure he is getting a balance of nutrients. Isaac [name] has been doing well with his diet.  He met with Melissa last week and got some new recipes  to try out.  He is looking forward to trying something new.  He continues to eat a good variety and likes the veggie bowls when he goes out.  He conitnues to get lots of heart healthy options with fruit  and nuts too. Isaac [name] reports doing well with his diet. He reports increasing his variety and he has made the black bean burger and he liked it. He also reports doing roasted brussells sprouts and cauliflower. he continues with a heart healthy diet with no reported issues. Isaac [name] continues to do well with his diet.  He likes tring new recipes.  He continues to do well with variety    Expected Outcome Meet with dietitian Short: Try new recipes Long: Continue to focus on good balance ST: Continue to try new recipes LT: enjoy a variety of meals that meet his needs, meet calorie and protein needs Continue to follow heart healthy diet             Nutrition Goals Discharge (Final Nutrition Goals Re-Evaluation):  Nutrition Goals Re-Evaluation - 04/16/22 2992       Goals   Nutrition Goal ST: Continue to try new recipes LT: enjoy a variety of meals that meet his needs, meet calorie and protein needs    Comment Isaac [name] continues to do well with his diet.  He likes tring new recipes.  He continues to do well with variety    Expected Outcome Continue to follow heart healthy diet             Psychosocial: Target Goals: Acknowledge presence or absence of significant depression and/or stress, maximize coping skills, provide positive support system. Participant is able to verbalize types and ability to use techniques and skills needed for reducing stress and depression.   Education: Stress, Anxiety, and Depression - Group verbal and visual presentation to define topics covered.  Reviews how body is impacted by stress, anxiety, and depression.  Also discusses healthy ways to reduce stress and to treat/manage anxiety and depression.  Written material given at graduation. Flowsheet Row Cardiac Rehab from 03/26/2022 in [facility] Cardiac and Pulmonary Rehab  Date 03/12/22  Educator [facility]  Instruction Review Code 1- [employer] Understanding       Education: Sleep Hygiene -Provides group verbal and written instruction  about how sleep can affect your health.  Define sleep hygiene, discuss sleep cycles and impact of sleep habits. Review good sleep hygiene tips.    Initial Review & Psychosocial Screening:  Initial Psych Review & Screening - 12/31/21 1306       Initial Review   Current issues with Current Stress Concerns    Source of Stress Concerns Chronic Illness      Family Dynamics   Good Support System? Yes      Barriers   Psychosocial barriers to participate in program There are no identifiable barriers or psychosocial needs.      Screening Interventions   Interventions Encouraged to exercise;Provide feedback about the scores to participant;To provide support and resources with identified psychosocial needs    Expected Outcomes Short Term goal: Utilizing psychosocial counselor, staff and physician to assist with identification of specific Stressors or current issues interfering with healing process. Setting desired goal for each stressor or current issue identified.;Long Term Goal: Stressors or current issues are controlled or eliminated.;Short Term goal: Identification and review with participant of any Quality of Life or Depression concerns found by scoring the questionnaire.;Long Term goal: The participant improves quality of Life and PHQ9 Scores as seen  by post scores and/or verbalization of changes             Quality of Life Scores:   Quality of Life - 04/14/22 0840       Quality of Life   Select Quality of Life      Quality of Life Scores   Health/Function Pre 22 %    Health/Function Post 25.9 %    Health/Function % Change 17.73 %    Socioeconomic Pre 24 %    Socioeconomic Post 28.79 %    Socioeconomic % Change  19.96 %    Psych/Spiritual Pre 29.14 %    Psych/Spiritual Post 28.29 %    Psych/Spiritual % Change -2.92 %    Family Pre 24 %    Family Post 25.2 %    Family % Change 5 %    GLOBAL Pre 24.41 %    GLOBAL Post 26.88 %    GLOBAL % Change 10.12 %             Scores of 19 and below usually indicate a poorer quality of life in these areas.  A difference of  2-3 points is a clinically meaningful difference.  A difference of 2-3 points in the total score of the Quality of Life Index has been associated with significant improvement in overall quality of life, self-image, physical symptoms, and general health in studies assessing change in quality of life.  PHQ-9: Review Flowsheet       04/14/2022 01/27/2022 01/06/2022 08/29/2021  Depression screen PHQ 2/9  Decreased Interest 0 0 2 0  Down, Depressed, Hopeless 0 0 1 1  PHQ - 2 Score 0 0 3 1  Altered sleeping 0 0 1 -  Tired, decreased energy 0 1 1 -  Change in appetite 0 0 2 -  Feeling bad or failure about yourself  0 0 2 -  Trouble concentrating 0 0 1 -  Moving slowly or fidgety/restless 0 0 0 -  Suicidal thoughts 0 0 2 -  PHQ-9 Score 0 1 12 -  Difficult doing work/chores Not difficult at all - Somewhat difficult -   Interpretation of Total Score  Total Score Depression Severity:  1-4 = Minimal depression, 5-9 = Mild depression, 10-14 = Moderate depression, 15-19 = Moderately severe depression, 20-27 = Severe depression   Psychosocial Evaluation and Intervention:  Psychosocial Evaluation - 12/31/21 1311       Psychosocial Evaluation & Interventions   Interventions Encouraged to exercise with the program and follow exercise prescription;Relaxation education    Comments Isaac [name] had his LVAD placed in December of 22 and started dialysis as well. He feels like he is adjusting well to all the new medical issues. He is taking it day by day. He can't partake in his usual activities, but is trying to adjust. He has a good support system. He wants to work on his gaining his strength back.    Expected Outcomes Short: attend cardiac rehab for education and exercise. Long; develop and maintain positive self care    Continue Psychosocial Services  Follow up required by staff              Psychosocial Re-Evaluation:  Psychosocial Re-Evaluation     [place] Name 01/27/22 0820 02/17/22 0756 03/26/22 0813 04/16/22 4401       Psychosocial Re-Evaluation   Current issues with Current Stress Concerns;Current Depression Current Stress Concerns None Identified --    Comments Isaac [name] is doing well in rehab.  His  PHQ has greatly improved.  He is down to 1 from 12 when he started.  He has tried not to stress about anything and has cleared his mind from dealing with things from his sister.  He has been doing more at home and sleeping well again. Isaac [name] continues to do well in rehab.  He does his best to just to keep moving forward.  He says he can't go backwards so just to keep moving.  He trying to stay positive.  He is still mangaging a few more things from his sister before he feels that he will be released from that stress.  He continues to sleep well once he gets to bed. He has his granddaughter with him this week.  She likes to hug on him frequently. Isaac [name] continues to stay positive and to not let the things he can't control bother him. He loves photography and feels it keeps him relaxed. He relies on his wife and daughter and two sons for support, he reports having a team. He reports sleeping well. Isaac [name] graduates next week.  He has really enjoyed the program and getting to know staff and classmates.  He likes being pushed and is worried that without [place] around he may not work as hard or consistently on his own.  We told him we are always a visit or phone call away.  He continues to sleep well and stays positive    Expected Outcomes Short: Continue to focus on positive Long: Conitnue to exercise for mental boost. Short: Finish up things with sister Long: continue to stay positive Continue to focus on positive Long: Conitnue to exercise for mental boost. Continue to exercise for mental boost and to boost stamina    Interventions Encouraged to attend Cardiac Rehabilitation for the exercise Encouraged to  attend Cardiac Rehabilitation for the exercise Encouraged to attend Cardiac Rehabilitation for the exercise Encouraged to attend Cardiac Rehabilitation for the exercise    Continue Psychosocial Services  Follow up required by staff -- Follow up required by staff Follow up required by staff             Psychosocial Discharge (Final Psychosocial Re-Evaluation):  Psychosocial Re-Evaluation - 04/16/22 1601       Psychosocial Re-Evaluation   Comments Isaac [name] graduates next week.  He has really enjoyed the program and getting to know staff and classmates.  He likes being pushed and is worried that without [place] around he may not work as hard or consistently on his own.  We told him we are always a visit or phone call away.  He continues to sleep well and stays positive    Expected Outcomes Continue to exercise for mental boost and to boost stamina    Interventions Encouraged to attend Cardiac Rehabilitation for the exercise    Continue Psychosocial Services  Follow up required by staff             Vocational Rehabilitation: Provide vocational rehab assistance to qualifying candidates.   Vocational Rehab Evaluation & Intervention:  Vocational Rehab - 12/31/21 1306       Initial Vocational Rehab Evaluation & Intervention   Assessment shows need for Vocational Rehabilitation No             Education: Education Goals: Education classes will be provided on a variety of topics geared toward better understanding of heart health and risk factor modification. Participant will state understanding/return demonstration of topics presented as noted by education test scores.  Learning Barriers/Preferences:  Learning  Barriers/Preferences - 12/31/21 1306       Learning Barriers/Preferences   Learning Barriers None    Learning Preferences None             General Cardiac Education Topics:  AED/CPR: - Group verbal and written instruction with the use of models to demonstrate the basic  use of the AED with the basic ABC's of resuscitation.   Anatomy and Cardiac Procedures: - Group verbal and visual presentation and models provide information about basic cardiac anatomy and function. Reviews the testing methods done to diagnose heart disease and the outcomes of the test results. Describes the treatment choices: Medical Management, Angioplasty, or Coronary Bypass Surgery for treating various heart conditions including Myocardial Infarction, Angina, Valve Disease, and Cardiac Arrhythmias.  Written material given at graduation. Flowsheet Row Cardiac Rehab from 03/26/2022 in [facility] Cardiac and Pulmonary Rehab  Education need identified 01/06/22  Date 01/29/22  Educator [facility]  Instruction Review Code 1- Verbalizes Understanding       Medication Safety: - Group verbal and visual instruction to review commonly prescribed medications for heart and lung disease. Reviews the medication, class of the drug, and side effects. Includes the steps to properly store meds and maintain the prescription regimen.  Written material given at graduation. Flowsheet Row Cardiac Rehab from 03/26/2022 in [facility] Cardiac and Pulmonary Rehab  Date 02/19/22  Educator SB  Instruction Review Code 1- Verbalizes Understanding       Intimacy: - Group verbal instruction through game format to discuss how heart and lung disease can affect sexual intimacy. Written material given at graduation.. Flowsheet Row Cardiac Rehab from 03/26/2022 in [facility] Cardiac and Pulmonary Rehab  Date 03/26/22  Educator [facility]  Instruction Review Code 1- Verbalizes Understanding       Know Your Numbers and Heart Failure: - Group verbal and visual instruction to discuss disease risk factors for cardiac and pulmonary disease and treatment options.  Reviews associated critical values for Overweight/Obesity, Hypertension, Cholesterol, and Diabetes.  Discusses basics of heart failure: signs/symptoms and treatments.  Introduces Heart Failure Zone  chart for action plan for heart failure.  Written material given at graduation. Flowsheet Row Cardiac Rehab from 03/26/2022 in [facility] Cardiac and Pulmonary Rehab  Date 02/26/22  Educator SB  Instruction Review Code 1- Verbalizes Understanding       Infection Prevention: - Provides verbal and written material to individual with discussion of infection control including proper hand washing and proper equipment cleaning during exercise session. Flowsheet Row Cardiac Rehab from 03/26/2022 in [facility] Cardiac and Pulmonary Rehab  Education need identified 01/06/22  Date 01/06/22  Educator [place]  Instruction Review Code 1- Verbalizes Understanding       Falls Prevention: - Provides verbal and written material to individual with discussion of falls prevention and safety. Flowsheet Row Cardiac Rehab from 03/26/2022 in [facility] Cardiac and Pulmonary Rehab  Education need identified 01/06/22  Date 01/06/22  Educator [place]  Instruction Review Code 1- Verbalizes Understanding       Other: -Provides group and verbal instruction on various topics (see comments)   Knowledge Questionnaire Score:  Knowledge Questionnaire Score - 04/14/22 0834       Knowledge Questionnaire Score   Pre Score 22/26: Angina, HF, Exercise, Nutrition    Post Score 25/26             Core Components/Risk Factors/Patient Goals at Admission:  Personal Goals and Risk Factors at Admission - 01/06/22 1538       Core Components/Risk Factors/Patient Goals  on Admission    Weight Management Yes;Weight Maintenance    Intervention Weight Management: Develop a combined nutrition and exercise program designed to reach desired caloric intake, while maintaining appropriate intake of nutrient and fiber, sodium and fats, and appropriate energy expenditure required for the weight goal.;Weight Management: Provide education and appropriate resources to help participant work on and attain dietary goals.;Weight Management/Obesity: Establish  reasonable short term and long term weight goals.    Admit Weight 169 lb (76.7 kg)    Goal Weight: Short Term 169 lb (76.7 kg)    Goal Weight: Long Term 169 lb (76.7 kg)    Expected Outcomes Short Term: Continue to assess and modify interventions until short term weight is achieved;Long Term: Adherence to nutrition and physical activity/exercise program aimed toward attainment of established weight goal;Weight Maintenance: Understanding of the daily nutrition guidelines, which includes 25-35% calories from fat, 7% or less cal from saturated fats, less than 270m cholesterol, less than 1.5gm of sodium, & 5 or more servings of fruits and vegetables daily;Understanding recommendations for meals to include 15-35% energy as protein, 25-35% energy from fat, 35-60% energy from carbohydrates, less than 2054mof dietary cholesterol, 20-35 gm of total fiber daily;Understanding of distribution of calorie intake throughout the day with the consumption of 4-5 meals/snacks    Heart Failure Yes    Intervention Provide a combined exercise and nutrition program that is supplemented with education, support and counseling about heart failure. Directed toward relieving symptoms such as shortness of breath, decreased exercise tolerance, and extremity edema.    Expected Outcomes Improve functional capacity of life;Short term: Attendance in program 2-3 days a week with increased exercise capacity. Reported lower sodium intake. Reported increased fruit and vegetable intake. Reports medication compliance.;Short term: Daily weights obtained and reported for increase. Utilizing diuretic protocols set by physician.;Long term: Adoption of self-care skills and reduction of barriers for early signs and symptoms recognition and intervention leading to self-care maintenance.    Hypertension Yes    Intervention Provide education on lifestyle modifcations including regular physical activity/exercise, weight management, moderate sodium  restriction and increased consumption of fresh fruit, vegetables, and low fat dairy, alcohol moderation, and smoking cessation.;Monitor prescription use compliance.    Expected Outcomes Short Term: Continued assessment and intervention until BP is < 140/9086mG in hypertensive participants. < 130/70m57m in hypertensive participants with diabetes, heart failure or chronic kidney disease.;Long Term: Maintenance of blood pressure at goal levels.             Education:Diabetes - Individual verbal and written instruction to review signs/symptoms of diabetes, desired ranges of glucose level fasting, after meals and with exercise. Acknowledge that pre and post exercise glucose checks will be done for 3 sessions at entry of program.   Core Components/Risk Factors/Patient Goals Review:   Goals and Risk Factor Review     Row Name 01/27/22 0824 02/17/22 0801 03/26/22 0757 04/16/22 0825       Core Components/Risk Factors/Patient Goals Review   Personal Goals Review Weight Management/Obesity;Heart Failure;Hypertension Weight Management/Obesity;Heart Failure;Hypertension Weight Management/Obesity;Heart Failure;Hypertension Weight Management/Obesity;Heart Failure;Hypertension    Review Isaac [name] is doing well in rehab.  His weight is holding fairly steady. He wants to slowly build back to 175 lb.  Currently at 169 lb.  He wants to put the muscle back on.  He may not be getting enough protein so I told him this is where the dietitian can be helpful.  He has not had any heart failure symptoms.  His pressures  are doing well.  He has been runnin garound 100s on dopplar in rehab.  He continues to check in with VAD clinic and has been getting good results. Isaac [name] continues to do well in rehab.  His weight is up a little, but he will lose it.  He still wants muscle and makes sure he is not retaining fluid.  He did get some veggie protein options.  His pressures are doing well in rehab (90s-100s).  He takes medarone before  dialyisis to keep it good. Isaac [name] reports that his weight has been steadily increasing and he feels this is bodyweight increase and not fluid. He had diaysis yesterday and reports it has been going well - his potassium and phosphorus numbers are looking good as well. He reports his BP has been doing well while here (today 80 using doppler) - right now we check it at rehab and he has it checked at dialysis. He continues to take his medications as directed with no issues. Isaac [name] continues to do well with keeping his weight steady.  He is now off dialysis and doing well.  Pressures contineus to do well.  He has not had any issues with his VAD and denies any heart failure symptoms.    Expected Outcomes Short: Talk with dietitian about protein Long: Conitnue to monitor risk factors Short: Conitnue to keep close eye on weight Long: Continue to manage heart failure Short: Conitnue to keep close eye on weight Long: Continue to manage heart failure Continue to montior risk factors and manage heart failure             Core Components/Risk Factors/Patient Goals at Discharge (Final Review):   Goals and Risk Factor Review - 04/16/22 0825       Core Components/Risk Factors/Patient Goals Review   Personal Goals Review Weight Management/Obesity;Heart Failure;Hypertension    Review Isaac [name] continues to do well with keeping his weight steady.  He is now off dialysis and doing well.  Pressures contineus to do well.  He has not had any issues with his VAD and denies any heart failure symptoms.    Expected Outcomes Continue to montior risk factors and manage heart failure             ITP Comments:  ITP Comments     Row Name 12/31/21 1315 01/06/22 1457 01/07/22 1419 01/13/22 0800 02/04/22 0926   ITP Comments Initial telephone orientation completed. Diagnosis can be found in [facility] 3/14. EP orientation scheduled for Tuesday 4/18 at 10am. Completed 6MWT and gym orientation. Initial ITP created and sent for review to Dr. Emily Filbert, Medical Director. 30 Day review completed. Medical Director ITP review done, changes made as directed, and signed approval by Medical Director. First full day of exercise!  Patient was oriented to gym and equipment including functions, settings, policies, and procedures.  Patient's individual exercise prescription and treatment plan were reviewed.  All starting workloads were established based on the results of the 6 minute walk test done at initial orientation visit.  The plan for exercise progression was also introduced and progression will be customized based on patient's performance and goals. 30 Day review completed. Medical Director ITP review done, changes made as directed, and signed approval by Medical Director.    NEW    Row Name 02/05/22 1440 03/04/22 1402 04/01/22 1202 04/23/22 0808     ITP Comments Completed initial RD consultation 30 Day review completed. Medical Director ITP review done, changes made as directed, and signed approval by  Medical Director. 30 Day review completed. Medical Director ITP review done, changes made as directed, and signed approval by Medical Director. Jalyn graduated today from  rehab with 36 sessions completed.  Details of the patient's exercise prescription and what He needs to do in order to continue the prescription and progress were discussed with patient.  Patient was given a copy of prescription and goals.  Patient verbalized understanding.  Tyvion plans to continue to exercise by returning to the [facility].             Comments: DISCHARGE ITP

## 2022-04-23 NOTE — Progress Notes (Signed)
Discharge Note   Isaac [name]   1951-06-15  Gwyndolyn Saxon graduated today from  rehab with 36 sessions completed.  Details of the patient's exercise prescription and what He needs to do in order to continue the prescription and progress were discussed with patient.  Patient was given a copy of prescription and goals.  Patient verbalized understanding.  Kodey plans to continue to exercise by returning to the [facility].   [name] Name 01/06/22 1516 04/07/22 0824       6 Minute Walk   Phase Initial --    Distance 1435 feet 1600 feet    Distance % Change -- 11.5 %    Distance Feet Change -- 165 ft    Walk Time 6 minutes 6 minutes    # of Rest Breaks 0 0    MPH 2.71 3.03    METS 3.21 3.41    RPE 10 13    Perceived Dyspnea  0 0    VO2 Peak 11.26 11.94    Symptoms No No    Resting HR 71 bpm 90 bpm    Resting BP --  84- doppler --  82 - doppler    Resting Oxygen Saturation  99 % --    Exercise Oxygen Saturation  during 6 min walk 99 % --    Max Ex. HR 93 bpm 101 bpm    Max Ex. BP --  104- doppler --  100 - doppler    2 Minute Post BP --  94-doppler --            Thank you for the referral. We enjoyed working with [name].

## 2022-04-30 ENCOUNTER — Ambulatory Visit (HOSPITAL_COMMUNITY): Payer: Self-pay | Admitting: Pharmacist

## 2022-04-30 LAB — POCT INR: INR: 2.3 (ref 2.0–3.0)

## 2022-05-07 ENCOUNTER — Ambulatory Visit (HOSPITAL_COMMUNITY): Payer: Self-pay | Admitting: Pharmacist

## 2022-05-07 LAB — POCT INR: INR: 2.3 (ref 2.0–3.0)

## 2022-05-14 ENCOUNTER — Ambulatory Visit (HOSPITAL_COMMUNITY): Payer: Self-pay | Admitting: Pharmacist

## 2022-05-14 LAB — POCT INR: INR: 2.7 (ref 2.0–3.0)

## 2022-05-19 ENCOUNTER — Encounter (HOSPITAL_COMMUNITY): Payer: Self-pay

## 2022-05-19 ENCOUNTER — Ambulatory Visit (HOSPITAL_COMMUNITY)
Admission: RE | Admit: 2022-05-19 | Discharge: 2022-05-19 | Disposition: A | Discharge status: Home / Self Care | Payer: Medicare HMO | Source: Ambulatory Visit | Attending: Cardiology | Admitting: Cardiology

## 2022-05-19 ENCOUNTER — Other Ambulatory Visit (HOSPITAL_COMMUNITY): Payer: Self-pay

## 2022-05-19 DIAGNOSIS — Z7901 Long term (current) use of anticoagulants: Secondary | ICD-10-CM | POA: Insufficient documentation

## 2022-05-19 DIAGNOSIS — Z95811 Presence of heart assist device: Secondary | ICD-10-CM | POA: Insufficient documentation

## 2022-05-19 NOTE — Progress Notes (Signed)
Patient paged VAD Coordinator stating that he has been experiencing nausea for the past two days and his wife stated she could not get a reliable blood pressure. Patient stated they would come into clinic this afternoon for a nursing visit. Orthostatic vitals and VAD numbers below. Patient seems to be dry. Dicussed with Dr Aundra Dubin. Advised patient to drink more fluids and verified current prescription for Zofran with refills if needed. Patient advised to page VAD Coordinator is symptoms get worse.  Patient has follow up appointment next Tuesday 05/26/22 with Dr Aundra Dubin.  Orthostatic Vitals: Sitting: Automatic BP: 92/77 (81) Doppler: 82 HR:69  Standing: Automatic BP:UTO Doppler: 82 HR:67  LVAD assessment:  HM III: Speed: 5300 rpms Flow: 4.5 Power: 3.8 w    PI: 4.2 Alarms: none Events: none  Fixed speed: 5300 Low speed limit: 5000   Ashby Dawes, BSN VAD Coordinator 24/7 Pager [phone]

## 2022-05-20 ENCOUNTER — Telehealth (HOSPITAL_COMMUNITY): Payer: Self-pay | Admitting: Unknown Physician Specialty

## 2022-05-20 NOTE — Telephone Encounter (Addendum)
Received call from pt stating that he continues to have N/V. BP on home machine is 113/34. VAD parameters Flow:5.1; PI: 3.1; Speed: 5300; Power: 3.9.  Pt tells me that 2 weeks ago he was seen by [facility] and was started on Lasix 80 daily. Pt tells me before starting this he felt good. Reviewed labs from PCP office yesterday with Dr Aundra Dubin. Remote ICD transmission sent by pt Reviewed transmission with Dr Aundra Dubin and BS rep. Pt is NSR in 70s with no events. We will plan to bring the pt to clinic in the morning at 0900 for possible IV fluids and reassessment of N/V and BP. Pt is most likely very dry from new Lasix start.  [name] RN, BSN VAD Coordinator 24/7 Pager [phone]

## 2022-05-21 ENCOUNTER — Ambulatory Visit (HOSPITAL_COMMUNITY)
Admission: RE | Admit: 2022-05-21 | Discharge: 2022-05-21 | Disposition: A | Discharge status: Home / Self Care | Payer: Medicare HMO | Source: Ambulatory Visit | Attending: Cardiology | Admitting: Cardiology

## 2022-05-21 ENCOUNTER — Ambulatory Visit (HOSPITAL_COMMUNITY)
Admission: RE | Admit: 2022-05-21 | Discharge: 2022-05-21 | Disposition: A | Discharge status: Home / Self Care | Payer: Medicare HMO | Source: Ambulatory Visit | Attending: Internal Medicine | Admitting: Internal Medicine

## 2022-05-21 ENCOUNTER — Other Ambulatory Visit (HOSPITAL_COMMUNITY): Payer: Self-pay | Admitting: Unknown Physician Specialty

## 2022-05-21 ENCOUNTER — Ambulatory Visit (HOSPITAL_COMMUNITY): Payer: Self-pay | Admitting: Pharmacist

## 2022-05-21 VITALS — BP 95/71 | HR 70 | Temp 100.8°F | Ht 69.0 in | Wt 178.0 lb

## 2022-05-21 DIAGNOSIS — Z95811 Presence of heart assist device: Secondary | ICD-10-CM | POA: Insufficient documentation

## 2022-05-21 DIAGNOSIS — Z7901 Long term (current) use of anticoagulants: Secondary | ICD-10-CM | POA: Insufficient documentation

## 2022-05-21 DIAGNOSIS — R0602 Shortness of breath: Secondary | ICD-10-CM | POA: Insufficient documentation

## 2022-05-21 DIAGNOSIS — I5022 Chronic systolic (congestive) heart failure: Secondary | ICD-10-CM | POA: Diagnosis not present

## 2022-05-21 DIAGNOSIS — R509 Fever, unspecified: Secondary | ICD-10-CM | POA: Insufficient documentation

## 2022-05-21 DIAGNOSIS — Z4801 Encounter for change or removal of surgical wound dressing: Secondary | ICD-10-CM | POA: Diagnosis not present

## 2022-05-21 LAB — URINALYSIS, ROUTINE W REFLEX MICROSCOPIC
Bilirubin Urine: NEGATIVE
Glucose, UA: NEGATIVE mg/dL
Ketones, ur: NEGATIVE mg/dL
Nitrite: POSITIVE — AB
Protein, ur: 30 mg/dL — AB
Specific Gravity, Urine: 1.013 (ref 1.005–1.030)
WBC, UA: >50 WBC/hpf — ABNORMAL HIGH (ref 0–5)
pH: 5 (ref 5.0–8.0)

## 2022-05-21 LAB — PROTIME-INR
INR: 1.9 — ABNORMAL HIGH (ref 0.8–1.2)
Prothrombin Time: 21.6 seconds — ABNORMAL HIGH (ref 11.4–15.2)

## 2022-05-21 LAB — CBC
HCT: 31.9 % — ABNORMAL LOW (ref 39.0–52.0)
Hemoglobin: 10.7 g/dL — ABNORMAL LOW (ref 13.0–17.0)
MCH: 29 pg (ref 26.0–34.0)
MCHC: 33.5 g/dL (ref 30.0–36.0)
MCV: 86.4 fL (ref 80.0–100.0)
Platelets: 160 10*3/uL (ref 150–400)
RBC: 3.69 MIL/uL — ABNORMAL LOW (ref 4.22–5.81)
RDW: 17.2 % — ABNORMAL HIGH (ref 11.5–15.5)
WBC: 11.8 10*3/uL — ABNORMAL HIGH (ref 4.0–10.5)
nRBC: 0 % (ref 0.0–0.2)

## 2022-05-21 LAB — COMPREHENSIVE METABOLIC PANEL
ALT: 19 U/L (ref 0–44)
AST: 18 U/L (ref 15–41)
Albumin: 3.3 g/dL — ABNORMAL LOW (ref 3.5–5.0)
Alkaline Phosphatase: 115 U/L (ref 38–126)
Anion gap: 9 (ref 5–15)
BUN: 44 mg/dL — ABNORMAL HIGH (ref 8–23)
CO2: 21 mmol/L — ABNORMAL LOW (ref 22–32)
Calcium: 8.9 mg/dL (ref 8.9–10.3)
Chloride: 104 mmol/L (ref 98–111)
Creatinine, Ser: 3.09 mg/dL — ABNORMAL HIGH (ref 0.61–1.24)
GFR, Estimated: 21 mL/min — ABNORMAL LOW (ref >60)
Glucose, Bld: 101 mg/dL — ABNORMAL HIGH (ref 70–99)
Potassium: 4.4 mmol/L (ref 3.5–5.1)
Sodium: 134 mmol/L — ABNORMAL LOW (ref 135–145)
Total Bilirubin: 1.9 mg/dL — ABNORMAL HIGH (ref 0.3–1.2)
Total Protein: 7 g/dL (ref 6.5–8.1)

## 2022-05-21 LAB — LACTATE DEHYDROGENASE: LDH: 164 U/L (ref 98–192)

## 2022-05-21 MED ORDER — DOXYCYCLINE HYCLATE 100 MG PO CAPS: 100.0000 mg | ORAL_CAPSULE | Freq: Two times a day (BID) | ORAL | 0 refills | Status: DC

## 2022-05-21 MED ORDER — RENA-VITE PO TABS: 1.0000 | ORAL_TABLET | Freq: Every day | ORAL | 11 refills | Status: DC

## 2022-05-21 MED ORDER — CIPROFLOXACIN HCL 500 MG PO TABS: 500.0000 mg | ORAL_TABLET | Freq: Every day | ORAL | 0 refills | Status: AC

## 2022-05-21 MED ORDER — FUROSEMIDE 40 MG PO TABS: 40.0000 mg | ORAL_TABLET | ORAL | 3 refills | Status: DC

## 2022-05-21 NOTE — Patient Instructions (Signed)
Start Doxycycline 100 mg twice a day for 10 days We will obtain a chest xray today Decrease Lasix to 40 mg every other day No change in Coumadin dose  Return to clinic next week to see Dr Aundra Dubin

## 2022-05-21 NOTE — Progress Notes (Addendum)
Patient presents for sick visit in [facility] today with his wife. Reports no problems with VAD equipment or concerns with drive line.  Pt called the office yesterday afternoon with c/o N/V, weakness and overall malaise. ICM analysis sent yesterday, confirming that pt was NSR in 70s. Pt was seen Tuesday for the same issues in our clinic for nurse visit. Pt brought to clinic this morning for fluids and to be reassessed by Dr Aundra Dubin.  Renee, PA with EP into see pt and interrogate BS. Pt is AV paced 70-75% of the time. Pt has had only 5 NSVT episodes since March and these were very brief. Report in pts shadow chart.  Pt walked into clinic. States that he just feels so tired, cold and tells me he had a temp of 100 last night. Denies productive cough, diff breathing, painful urination. Driveline is unremarkable. Denies diarrhea or blood in his stool.  20 g PIV started in Left wrist, pt given 1L of LR. Blood cultures, urine cultures and labs sent. Chest xray obtained.   Pt tells me that he saw Nephrology a couple weeks ago and was started on Lasix 80 mg daily. Pt states that he doesn't know why this was started. VAD coordinator has requested notes from [facility]. Pt has not had dialysis in over a month.   Denies falls, heart failure symptoms, and signs of bleeding. Pt completed [place] [facility] rehab program and he really enjoyed this. We will send over referral for maintenance program.  Vital Signs:  Temp: 100.8 Doppler Pressure: 98 Automatc BP: 95/71 (79) HR: 70 paced SPO2: 100% on RA   Weight: 178 lb w/ eqt Last weight: 176.5 lbs w/eqt   VAD Indication: Destination Therapy due to age and kidney disease   LVAD assessment:  HM III: Speed: 5300 rpms Flow: 5 Power: 3.9 w    PI: 3 Alarms: none Events: rare PI events; 10 yesterday  Fixed speed: 5300 Low speed limit: 5000  Primary controller: back up battery due for replacement in 25 months Secondary controller:  back up battery due  for replacement in 25 months   I reviewed the LVAD parameters from today and compared the results to the patient's prior recorded data. LVAD interrogation was NEGATIVE for significant power changes, NEGATIVE for clinical alarms and STABLE for PI events/speed drops. No programming changes were made and pump is functioning within specified parameters. Pt is performing daily controller and system monitor self tests along with completing weekly and monthly maintenance for LVAD equipment.   LVAD equipment check completed and is in good working order. Back-up equipment present. Charged back up battery and performed self-test on equipment.    Annual Equipment Maintenance on UBC/PM was performed on 10/2021.   Exit Site Care: Wife has been performing weekly dressing changes using weekly dressings. Drive line anchor correctly applied. Pt states he has sufficient dressing supplies at home.     Device:  [employer] dual  Therapies: on Pacing: AV paced 60 >> increased to 70  Last check: 12/02/21    BP & Labs:  Doppler 98 - Doppler is reflecting Modified systolic   Hgb 47.4 - No S/S of bleeding. Specifically denies melena/BRBPR or nosebleeds.   LDH stable at 164 with established baseline of 200 - 300. Denies tea-colored urine. No power elevations noted on interrogation.   20 g PIV removed from L wrist. Gauze and coban applied.  U/A d/w Dr Aundra Dubin will change therapy from Doxy to Cipro. Pt called. Wife states  they havent picked up any meds from the pharmacy yet.   Patient Instructions:  Start Cipro 500 mg daily for 14 days We will obtain a chest xray today Hold Lasix. Restart Lasix at 40 mg every other day on Saturday No change in Coumadin dose  Referral sent to [facility] for main program Return to clinic next week to see Dr Harvie Heck RN [place] Coordinator  Office: [phone]  24/7 Pager: [phone]      Cardiology: Dr. Aundra Dubin PCP: [name], MD  Follow up for  Heart Failure/LVAD: 71 y.o. with history of CAD, HTN, atrial fibrillation, and chronic systolic CHF returns for followup of CHF/LVAD.   Patient has had a long-standing cardiomyopathy, EF had been in the 25% range for years.  He had PCI to OM1 in 2007 and RCA in 2013.  From his report, these episodes do not sound like ACS events.  Echo in 12/22 showed EF < 20%, severe LV dilation, restrictive diastolic function, moderate RV dysfunction, moderate MR, mod-severe TR. HF was been complicated by CKD stage 3. He was also noted on device interrogation to have been in atrial fibrillation persistently since 10/22.  He was admitted at [facility] in 12/22.  BP was low, his BP-active meds were stopped and midodrine was begun.  He was diuresed and discharged home.    He reports ongoing severe dyspnea and was admitted again in 12/22 after he stood up then passed out at home.  Device interrogation showed run of VT treated x 2 with ATP possibly around the time of syncope.  RHC in 12/22 showed low output HF and he developed cardiogenic shock. Impella 5.5 was placed.  On 09/17/21, HM3 LVAD was placed, ICD leads were plastered to the tricuspid valve with severe TR, the valve was not replaced.  The patient had significant interoperative hypotension.  Post-operatively, he developed progressive renal failure and was started on CVVH then iHD.  He had a bumpy post-op course with renal failure and RV failure, but was ultimately able to discharge home on 10/24/21.   Ramp echo in 3/23, RV mildly dilated with normal systolic function, severe TR, LV EF 20-25%, aortic valve opens every beat.  Speed increased to 5300 rpm. The IV septum was mildly leftwards at 5300 rpm but flow increased and AoV still opened every beat.   Patient returns for LVAD followup. He has been off HD for about a month and reports good UOP.  Last creatinine 2.5.  Lasix started about 2-3 weeks ago at 80 mg daily. MAP 70s.  Patient reports 3 days of nausea/emesis.  No  diarrhea.  Fever to 100.8 today.  Driveline with no drainage.  Rare cough, no significant exertional dyspnea.  Poor po intake for 2-3 days.   Denies LVAD alarms.  Denies driveline trauma, erythema or drainage.  Denies ICD shocks.   Reports taking Coumadin as prescribed and adherence to anticoagulation based dietary restrictions.  Denies bright red blood per rectum or melena, no dark urine or hematuria.    Labs (3/23): LFTs normal, TSH mildly elevated Labs (7/23): hgb 12.1 Labs (8/23): hgb 11.8, K 4.5, creatinine 2.5  [employer] device interrogation: 5 runs NSVT, 70% RV pacing (this is unchanged)  PMH: 1. HTN 2. Hyperlipidemia 3. CAD: H/o PCI to [place] 2007 and PCI to RCA in 2013.  Uncertain of circumstances but do not sound like ACS events.  4. ESRD 5. Atrial fibrillation: Paroxysmal 6. Chronic systolic CHF: Long-standing cardiomyopathy with EF in the  25% range.  Cause uncertain, has history of PCI to RCA and OM1, but degree of CAD does not seem to explain severity of cardiomyopathy. Father had history of CAD/CHF.  Not heavy drinker. Has [employer] ICD.  - Echo (12/22): EF < 20%, severe LV dilation, restrictive diastolic function, moderate RV dysfunction, moderate MR, mod-severe TR.  - 12/22 cardiogenic shock with Impella 5.5 placement then Heartmate 3 LVAD.  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  7. VT 8. RV failure/severe TR   Current Outpatient Medications  Medication Sig Dispense Refill   ciprofloxacin (CIPRO) 500 MG tablet Take 1 tablet (500 mg total) by mouth daily with breakfast for 14 days. 14 tablet 0   furosemide (LASIX) 40 MG tablet Take 1 tablet (40 mg total) by mouth every other day. 30 tablet 3   multivitamin (RENA-VIT) TABS tablet Take 1 tablet by mouth daily. 30 tablet 11   albuterol (VENTOLIN HFA) 108 (90 Base) MCG/ACT inhaler Inhale 2 puffs into the lungs every 6 (six) hours as needed. (Patient not taking: Reported on  12/09/2021)     amiodarone (PACERONE) 200 MG tablet Take 0.5 tablets (100 mg total) by mouth daily. 30 tablet 6   mirtazapine (REMERON) 7.5 MG tablet Take 1 tablet (7.5 mg total) by mouth at bedtime. 30 tablet 6   Multiple Vitamins-Minerals (CERTAVITE/ANTIOXIDANTS) TABS Take 1 tablet by mouth at bedtime. (Patient not taking: Reported on 04/02/2022) 30 tablet 11   ondansetron (ZOFRAN) 4 MG tablet Take 1 tablet (4 mg total) by mouth every 8 (eight) hours as needed for nausea or vomiting. (Patient not taking: Reported on 01/20/2022) 40 tablet 3   pantoprazole (PROTONIX) 40 MG tablet Take 1 tablet (40 mg total) by mouth daily. 30 tablet 6   rosuvastatin (CRESTOR) 10 MG tablet Take 1 tablet (10 mg total) by mouth daily. 30 tablet 6   sildenafil (REVATIO) 20 MG tablet Take 1 tablet (20 mg total) by mouth 3 (three) times daily. 90 tablet 6   warfarin (COUMADIN) 2 MG tablet Take 4 mg (2 tabs) every Monday/Wednesday/Friday and 2 mg (1 tab) all other days or as directed by the [facility]. 120 tablet 11   No current facility-administered medications for this encounter.    Mushroom extract complex and Neosporin [neomycin-bacitracin zn-polymyx]  REVIEW OF SYSTEMS: All systems negative except as listed in HPI, PMH and Problem list.   LVAD INTERROGATION:  See LVAD nurse's note above.   I reviewed the LVAD parameters from today, and compared the results to the patient's prior recorded data.  No programming changes were made.  The LVAD is functioning within specified parameters.  The patient performs LVAD self-test daily.  LVAD interrogation was negative for any significant power changes, alarms or PI events/speed drops.  LVAD equipment check completed and is in good working order.  Back-up equipment present.   LVAD education done on emergency procedures and precautions and reviewed exit site care.    Vitals:   05/21/22 1322  BP: 95/71  Pulse: 70  Temp: (!) 100.8 F (38.2 C)  SpO2: 100%  Weight:  80.7 kg (178 lb)  Height: 5\' 9"  (1.753 m)  MAP 87  Physical Exam: General: Well appearing this am. NAD.  HEENT: Normal. Neck: Supple, JVP 7-8 cm. Carotids OK.  Cardiac:  Mechanical heart sounds with LVAD hum present.  Lungs:  CTAB, normal effort.  Abdomen:  NT, ND, no HSM. No bruits or masses. +BS  LVAD  exit site: Well-healed and incorporated. Dressing dry and intact. No erythema or drainage. Stabilization device present and accurately applied. Driveline dressing changed daily per sterile technique. Extremities:  Warm and dry. No cyanosis, clubbing, rash, or edema.  Neuro:  Alert & oriented x 3. Cranial nerves grossly intact. Moves all 4 extremities w/o difficulty. Affect pleasant    ASSESSMENT AND PLAN: 1. Chronic systolic CHF:  Long-standing cardiomyopathy.  [place].  Echo in 12/22 with EF < 20%, severe LV dilation, restrictive diastolic function, moderate RV dysfunction, moderate MR, mod-severe TR. Cause of cardiomyopathy is uncertain.  He has a history of CAD, but I do not think that the described CAD from the past could explain his cardiomyopathy, but CAD could have progressed.  He was admitted in 12/22 with cardiogenic shock and Impella 5.5. s/p HM III VAD on 09/17/22.  Post-op course complicated by AKI and RV failure. He had been on HD but has been off x 1 month with good UOP.  He is not volume overloaded on exam.  NYHA class I-II. Suspect volume down with nausea/vomiting, poor po intake.  - I will give 1 L NS today in clinic due to dehydration.  - Hold Lasix x 2 days then decrease to 40 mg qod.  - Not using midodrine.  - Continue sildenafil 20 tid for RV.   - Warfarin for INR 2-2.5.   - He is now off ASA.  - refer for maintenance cardiac rehab.  2. Tricuspid regurgitation:  Tricuspid repair not done at time of VAD due to proximity of ICD wires and hypotension during surgery.  He has severe TR.  3. Atrial fibrillation: Regular rhythm. - Continue amiodarone 100 mg daily,  check LFTs and TSH.  He will need a regular eye exam. 4. ESRD: Suspect intra-op hypotension at the time of LVAD placement led to development of ATN => urine sediment looked like ATN per renal. Had initial CVVH and transitioned over to HD. He is starting to make more urine and has been off HD x 1 month.   - Continue to follow off HD.  - As above, decrease Lasix.    5. CAD: History of PCI to OM1 in 2007 and RCA in 2013.  No CP or ACs.  - Deferred cardiac cath in 12/22 due to AKI and plan for VAD - Continue Crestor.   6. ID: Low grade fever with nausea/vomiting x 3 days.  UA suggestive of UTI. CXR today clear.  - Cipro 500 mg daily x 2 wks given male with UTI.  - Send urine and blood cultures.   Followup in 2 months.   Loralie Champagne 05/23/2022

## 2022-05-22 ENCOUNTER — Other Ambulatory Visit (HOSPITAL_COMMUNITY): Payer: Self-pay | Admitting: Unknown Physician Specialty

## 2022-05-22 DIAGNOSIS — Z95811 Presence of heart assist device: Secondary | ICD-10-CM

## 2022-05-22 DIAGNOSIS — Z7901 Long term (current) use of anticoagulants: Secondary | ICD-10-CM

## 2022-05-24 LAB — URINE CULTURE: Culture: >=100000 — AB

## 2022-05-26 ENCOUNTER — Encounter (HOSPITAL_COMMUNITY): Payer: Self-pay

## 2022-05-26 ENCOUNTER — Ambulatory Visit (HOSPITAL_COMMUNITY): Payer: Self-pay | Admitting: Pharmacist

## 2022-05-26 ENCOUNTER — Ambulatory Visit (HOSPITAL_COMMUNITY)
Admission: RE | Admit: 2022-05-26 | Discharge: 2022-05-26 | Disposition: A | Discharge status: Home / Self Care | Payer: Medicare HMO | Source: Ambulatory Visit | Attending: Cardiology | Admitting: Cardiology

## 2022-05-26 VITALS — BP 98/0 | HR 69 | Temp 97.5°F | Wt 182.0 lb

## 2022-05-26 DIAGNOSIS — Z87440 Personal history of urinary (tract) infections: Secondary | ICD-10-CM | POA: Diagnosis not present

## 2022-05-26 DIAGNOSIS — I5022 Chronic systolic (congestive) heart failure: Secondary | ICD-10-CM | POA: Diagnosis not present

## 2022-05-26 DIAGNOSIS — E785 Hyperlipidemia, unspecified: Secondary | ICD-10-CM | POA: Insufficient documentation

## 2022-05-26 DIAGNOSIS — I132 Hypertensive heart and chronic kidney disease with heart failure and with stage 5 chronic kidney disease, or end stage renal disease: Secondary | ICD-10-CM | POA: Diagnosis not present

## 2022-05-26 DIAGNOSIS — N186 End stage renal disease: Secondary | ICD-10-CM | POA: Diagnosis not present

## 2022-05-26 DIAGNOSIS — I480 Paroxysmal atrial fibrillation: Secondary | ICD-10-CM | POA: Diagnosis not present

## 2022-05-26 DIAGNOSIS — Z7901 Long term (current) use of anticoagulants: Secondary | ICD-10-CM | POA: Insufficient documentation

## 2022-05-26 DIAGNOSIS — I2510 Atherosclerotic heart disease of native coronary artery without angina pectoris: Secondary | ICD-10-CM | POA: Diagnosis not present

## 2022-05-26 DIAGNOSIS — Z95811 Presence of heart assist device: Secondary | ICD-10-CM

## 2022-05-26 DIAGNOSIS — Z955 Presence of coronary angioplasty implant and graft: Secondary | ICD-10-CM | POA: Insufficient documentation

## 2022-05-26 LAB — CULTURE, BLOOD (SINGLE)
Culture: NO GROWTH
Culture: NO GROWTH
Special Requests: ADEQUATE
Special Requests: ADEQUATE

## 2022-05-26 LAB — BASIC METABOLIC PANEL
Anion gap: 7 (ref 5–15)
BUN: 47 mg/dL — ABNORMAL HIGH (ref 8–23)
CO2: 22 mmol/L (ref 22–32)
Calcium: 8.8 mg/dL — ABNORMAL LOW (ref 8.9–10.3)
Chloride: 108 mmol/L (ref 98–111)
Creatinine, Ser: 2.84 mg/dL — ABNORMAL HIGH (ref 0.61–1.24)
GFR, Estimated: 23 mL/min — ABNORMAL LOW (ref >60)
Glucose, Bld: 100 mg/dL — ABNORMAL HIGH (ref 70–99)
Potassium: 4.2 mmol/L (ref 3.5–5.1)
Sodium: 137 mmol/L (ref 135–145)

## 2022-05-26 LAB — CBC
HCT: 30.5 % — ABNORMAL LOW (ref 39.0–52.0)
Hemoglobin: 9.9 g/dL — ABNORMAL LOW (ref 13.0–17.0)
MCH: 28.9 pg (ref 26.0–34.0)
MCHC: 32.5 g/dL (ref 30.0–36.0)
MCV: 88.9 fL (ref 80.0–100.0)
Platelets: 214 10*3/uL (ref 150–400)
RBC: 3.43 MIL/uL — ABNORMAL LOW (ref 4.22–5.81)
RDW: 17.2 % — ABNORMAL HIGH (ref 11.5–15.5)
WBC: 4.6 10*3/uL (ref 4.0–10.5)
nRBC: 0 % (ref 0.0–0.2)

## 2022-05-26 LAB — PROTIME-INR
INR: 2.6 — ABNORMAL HIGH (ref 0.8–1.2)
Prothrombin Time: 27.8 seconds — ABNORMAL HIGH (ref 11.4–15.2)

## 2022-05-26 LAB — LACTATE DEHYDROGENASE: LDH: 153 U/L (ref 98–192)

## 2022-05-26 MED ORDER — FUROSEMIDE 40 MG PO TABS: 60.0000 mg | ORAL_TABLET | ORAL | 3 refills | Status: DC

## 2022-05-26 NOTE — Progress Notes (Addendum)
Patient presents for a 1 week f/u after presenting for sick visit in [facility] last week with his wife. Patient was c/o N/V, weakness and overall malaise. ICM analysis sent, confirming that pt was NSR in 70s. Pt given 1L of LR. Blood cultures, urine cultures and labs sent. Chest xray obtained.   Urine culture came back positive. Patient placed on Ciprofloxacin till 06/04/22. Patient states medication causes instability and constipation. Discussed with Dr Aundra Dubin at bedside. Instructed to continue course of Ciprofloxacin and call VAD Coordinator's if symptoms increase.  Reports no problems with VAD equipment or concerns with drive line. He states [place].  Denies falls, heart failure symptoms, and signs of bleeding. Pt referred to [facility] cardiac rehab maintenance program as he really enjoyed this.  Vital Signs:  Temp: 97.5 Doppler Pressure: 98 Automatc BP: 108/73 (86) HR: 70 paced SPO2: 100% on RA   Weight: 182 lb w/ eqt Last weight: 178 lb w/eqt   VAD Indication: Destination Therapy due to age and kidney disease   LVAD assessment:  HM III: Speed: 5300 rpms Flow: 4 Power: 4 w    PI: 4.2 Alarms: none Events: rare PI events  Fixed speed: 5300 Low speed limit: 5000  Primary controller: back up battery due for replacement in 24 months Secondary controller:  back up battery due for replacement in 24 months   I reviewed the LVAD parameters from today and compared the results to the patient's prior recorded data. LVAD interrogation was NEGATIVE for significant power changes, NEGATIVE for clinical alarms and STABLE for PI events/speed drops. No programming changes were made and pump is functioning within specified parameters. Pt is performing daily controller and system monitor self tests along with completing weekly and monthly maintenance for LVAD equipment.   LVAD equipment check completed and is in good working order. Back-up equipment  present. Charged back up battery and performed self-test on equipment.    Annual Equipment Maintenance on UBC/PM was performed on 10/2021.   Exit Site Care: Wife has been performing weekly dressing changes using weekly dressings. Drive line anchor correctly applied. Pt given 8 weekly kits for home use.   Device:  [employer] dual  Therapies: on Pacing: AV paced 60 >> increased to 70  Last check: 05/21/22 in [facility]    BP & Labs:  Doppler 98 - Doppler is reflecting Modified systolic   Hgb 9.9 - No S/S of bleeding. Specifically denies melena/BRBPR or nosebleeds.   LDH stable at 153 with established baseline of 200 - 300. Denies tea-colored urine. No power elevations noted on interrogation.   Patient Instructions:  Increase Lasix to 60mg  daily Continue Cipro for full 14 days; Call if you cannot tolerate medication  Return for labs in 10 days Coumadin dosing per Lauren Return to clinic in 2 months   [place] Gooding Coordinator  Office: [phone]  24/7 Pager: [place] RN [place] Coordinator  Office: [phone]  24/7 Pager: [phone]      Cardiology: Dr. Aundra Dubin PCP: [name], MD  Follow up for Heart Failure/LVAD: 71 y.o. with history of CAD, HTN, atrial fibrillation, and chronic systolic CHF returns for followup of CHF/LVAD.   Patient has had a long-standing cardiomyopathy, EF had been in the 25% range for years.  He had PCI to OM1 in 2007 and RCA in 2013.  From his report, these episodes do not sound like ACS events.  Echo in  12/22 showed EF < 20%, severe LV dilation, restrictive diastolic function, moderate RV dysfunction, moderate MR, mod-severe TR. HF was been complicated by CKD stage 3. He was also noted on device interrogation to have been in atrial fibrillation persistently since 10/22.  He was admitted at [facility] in 12/22.  BP was low, his BP-active meds were stopped and midodrine was begun.  He was diuresed and discharged home.     He reports ongoing severe dyspnea and was admitted again in 12/22 after he stood up then passed out at home.  Device interrogation showed run of VT treated x 2 with ATP possibly around the time of syncope.  RHC in 12/22 showed low output HF and he developed cardiogenic shock. Impella 5.5 was placed.  On 09/17/21, HM3 LVAD was placed, ICD leads were plastered to the tricuspid valve with severe TR, the valve was not replaced.  The patient had significant interoperative hypotension.  Post-operatively, he developed progressive renal failure and was started on CVVH then iHD.  He had a bumpy post-op course with renal failure and RV failure, but was ultimately able to discharge home on 10/24/21.   Ramp echo in 3/23, RV mildly dilated with normal systolic function, severe TR, LV EF 20-25%, aortic valve opens every beat.  Speed increased to 5300 rpm. The IV septum was mildly leftwards at 5300 rpm but flow increased and AoV still opened every beat.   Patient returns for LVAD followup. He has been off HD for about a month and reports good UOP.  Last creatinine 3.09.  Last week, he was seen for a sick visit and found to have citrobacter UTI.  He is now on Cipro and is feeling better. He has occasional cough and congestion.  No dyspnea walking on flat ground. MAP 86.  No further fever. No dysuria.   Denies LVAD alarms.  Denies driveline trauma, erythema or drainage.  Denies ICD shocks.   Reports taking Coumadin as prescribed and adherence to anticoagulation based dietary restrictions.  Denies bright red blood per rectum or melena, no dark urine or hematuria.    Labs (3/23): LFTs normal, TSH mildly elevated Labs (7/23): hgb 12.1 Labs (8/23): hgb 11.8 => 9.9, K 4.4, creatinine 2.5 => 3.09, LFTs normal.   PMH: 1. HTN 2. Hyperlipidemia 3. CAD: H/o PCI to [place] 2007 and PCI to RCA in 2013.  Uncertain of circumstances but do not sound like ACS events.  4. ESRD 5. Atrial fibrillation: Paroxysmal 6. Chronic systolic  CHF: Long-standing cardiomyopathy with EF in the 25% range.  Cause uncertain, has history of PCI to RCA and OM1, but degree of CAD does not seem to explain severity of cardiomyopathy. Father had history of CAD/CHF.  Not heavy drinker. Has [employer] ICD.  - Echo (12/22): EF < 20%, severe LV dilation, restrictive diastolic function, moderate RV dysfunction, moderate MR, mod-severe TR.  - 12/22 cardiogenic shock with Impella 5.5 placement then Heartmate 3 LVAD.  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  7. VT 8. RV failure/severe TR 9. Citrobacter UTI (8/23)   Current Outpatient Medications  Medication Sig Dispense Refill   albuterol (VENTOLIN HFA) 108 (90 Base) MCG/ACT inhaler Inhale 2 puffs into the lungs every 6 (six) hours as needed.     amiodarone (PACERONE) 200 MG tablet Take 0.5 tablets (100 mg total) by mouth daily. 30 tablet 6   ciprofloxacin (CIPRO) 500 MG tablet Take 1 tablet (500 mg total) by mouth daily with  breakfast for 14 days. 14 tablet 0   mirtazapine (REMERON) 7.5 MG tablet Take 1 tablet (7.5 mg total) by mouth at bedtime. 30 tablet 6   rosuvastatin (CRESTOR) 10 MG tablet Take 1 tablet (10 mg total) by mouth daily. 30 tablet 6   sildenafil (REVATIO) 20 MG tablet Take 1 tablet (20 mg total) by mouth 3 (three) times daily. 90 tablet 6   warfarin (COUMADIN) 2 MG tablet Take 4 mg (2 tabs) every Monday/Wednesday/Friday and 2 mg (1 tab) all other days or as directed by the [facility]. 120 tablet 11   furosemide (LASIX) 40 MG tablet Take 1.5 tablets (60 mg total) by mouth every other day. 30 tablet 3   Multiple Vitamins-Minerals (CERTAVITE/ANTIOXIDANTS) TABS Take 1 tablet by mouth at bedtime. (Patient not taking: Reported on 04/02/2022) 30 tablet 11   multivitamin (RENA-VIT) TABS tablet Take 1 tablet by mouth daily. (Patient not taking: Reported on 05/26/2022) 30 tablet 11   ondansetron (ZOFRAN) 4 MG tablet Take 1 tablet (4 mg total) by  mouth every 8 (eight) hours as needed for nausea or vomiting. (Patient not taking: Reported on 01/20/2022) 40 tablet 3   pantoprazole (PROTONIX) 40 MG tablet Take 1 tablet (40 mg total) by mouth daily. (Patient not taking: Reported on 05/26/2022) 30 tablet 6   No current facility-administered medications for this encounter.    Mushroom extract complex and Neosporin [neomycin-bacitracin zn-polymyx]  REVIEW OF SYSTEMS: All systems negative except as listed in HPI, PMH and Problem list.   LVAD INTERROGATION:  See LVAD nurse's note above.   I reviewed the LVAD parameters from today, and compared the results to the patient's prior recorded data.  No programming changes were made.  The LVAD is functioning within specified parameters.  The patient performs LVAD self-test daily.  LVAD interrogation was negative for any significant power changes, alarms or PI events/speed drops.  LVAD equipment check completed and is in good working order.  Back-up equipment present.   LVAD education done on emergency procedures and precautions and reviewed exit site care.    Vitals:   05/26/22 1125 05/26/22 1127  BP: 108/73 (!) 98/0  Pulse: 69   Temp: (!) 97.5 F (36.4 C)   SpO2: 100%   Weight: 82.6 kg (182 lb)   MAP 87  Physical Exam: General: Well appearing this am. NAD.  HEENT: Normal. Neck: Supple, JVP 8-9 cm. Carotids OK.  Cardiac:  Mechanical heart sounds with LVAD hum present.  Lungs:  CTAB, normal effort.  Abdomen:  NT, ND, no HSM. No bruits or masses. +BS  LVAD exit site: Well-healed and incorporated. Dressing dry and intact. No erythema or drainage. Stabilization device present and accurately applied. Driveline dressing changed daily per sterile technique. Extremities:  Warm and dry. No cyanosis, clubbing, rash. Trace ankle edema.  Neuro:  Alert & oriented x 3. Cranial nerves grossly intact. Moves all 4 extremities w/o difficulty. Affect pleasant    ASSESSMENT AND PLAN: 1. Chronic systolic CHF:   Long-standing cardiomyopathy.  [place].  Echo in 12/22 with EF < 20%, severe LV dilation, restrictive diastolic function, moderate RV dysfunction, moderate MR, mod-severe TR. Cause of cardiomyopathy is uncertain.  He has a history of CAD, but I do not think that the described CAD from the past could explain his cardiomyopathy, but CAD could have progressed.  He was admitted in 12/22 with cardiogenic shock and Impella 5.5. s/p HM III VAD on 09/17/22.  Post-op course complicated by AKI and  RV failure. He had been on HD but has been off x 1 month with good UOP.  Mild volume overload on exam since Lasix held last week.   - Restart Lasix at lower dose, 60 mg daily.  BMET today and in 10 days.  - Not using midodrine.  - Continue sildenafil 20 tid for RV.   - Warfarin for INR 2-2.5.   - He is now off ASA.  2. Tricuspid regurgitation:  Tricuspid repair not done at time of VAD due to proximity of ICD wires and hypotension during surgery.  He has severe TR.  3. Atrial fibrillation: Regular rhythm. - Continue amiodarone 100 mg daily, recent LFTs and TSH normal.  He will need a regular eye exam. 4. ESRD: Suspect intra-op hypotension at the time of LVAD placement led to development of ATN => urine sediment looked like ATN per renal. Had initial CVVH and transitioned over to HD. He is starting to make more urine and has been off HD x 1 month.   - Continue to follow off HD.  - As above, start Lasix 60 mg daily.   5. CAD: History of PCI to OM1 in 2007 and RCA in 2013.  No CP or ACs.  - Deferred cardiac cath in 12/22 due to AKI and plan for VAD - Continue Crestor.   6. ID: Citrobacter UTI on Cipro x 2 wks total.  In the past, he had trouble with his equilibrium with Cipro, but no problems at this time.   Followup in 2 months.   Loralie Champagne 05/26/2022

## 2022-05-26 NOTE — Patient Instructions (Signed)
Increase Lasix to 60mg  daily Continue Cipro for full 14 days; Call if you cannot tolerate medication  Return for labs in 10 days Coumadin dosing per [name] Return to clinic in 2 months

## 2022-05-29 ENCOUNTER — Ambulatory Visit (HOSPITAL_COMMUNITY): Payer: Self-pay | Admitting: Pharmacist

## 2022-05-29 LAB — POCT INR: INR: 3.1 — AB (ref 2.0–3.0)

## 2022-06-02 ENCOUNTER — Ambulatory Visit (INDEPENDENT_AMBULATORY_CARE_PROVIDER_SITE_OTHER): Payer: Medicare HMO

## 2022-06-02 DIAGNOSIS — I5022 Chronic systolic (congestive) heart failure: Secondary | ICD-10-CM

## 2022-06-03 ENCOUNTER — Other Ambulatory Visit: Payer: Self-pay | Admitting: Cardiology

## 2022-06-05 ENCOUNTER — Ambulatory Visit (HOSPITAL_COMMUNITY): Payer: Self-pay | Admitting: Pharmacist

## 2022-06-05 ENCOUNTER — Other Ambulatory Visit (HOSPITAL_COMMUNITY): Payer: Self-pay | Admitting: *Deleted

## 2022-06-05 ENCOUNTER — Ambulatory Visit (HOSPITAL_COMMUNITY)
Admission: RE | Admit: 2022-06-05 | Discharge: 2022-06-05 | Disposition: A | Discharge status: Home / Self Care | Payer: Medicare HMO | Source: Ambulatory Visit | Attending: Cardiology | Admitting: Cardiology

## 2022-06-05 DIAGNOSIS — Z95811 Presence of heart assist device: Secondary | ICD-10-CM

## 2022-06-05 LAB — BASIC METABOLIC PANEL
Anion gap: 8 (ref 5–15)
BUN: 37 mg/dL — ABNORMAL HIGH (ref 8–23)
CO2: 24 mmol/L (ref 22–32)
Calcium: 9.5 mg/dL (ref 8.9–10.3)
Chloride: 109 mmol/L (ref 98–111)
Creatinine, Ser: 2.88 mg/dL — ABNORMAL HIGH (ref 0.61–1.24)
GFR, Estimated: 23 mL/min — ABNORMAL LOW (ref >60)
Glucose, Bld: 99 mg/dL (ref 70–99)
Potassium: 4.4 mmol/L (ref 3.5–5.1)
Sodium: 141 mmol/L (ref 135–145)

## 2022-06-05 LAB — POCT INR: INR: 2.5 (ref 2.0–3.0)

## 2022-06-05 LAB — CUP PACEART REMOTE DEVICE CHECK
Battery Remaining Longevity: 10 mo
Battery Remaining Percentage: 11 %
Brady Statistic RA Percent Paced: 76 %
Brady Statistic RV Percent Paced: 71 %
Date Time Interrogation Session: 20230913104900
HighPow Impedance: 45 Ohm
Implantable Lead Implant Date: 20140205
Implantable Lead Implant Date: 20140205
Implantable Lead Location: 753859
Implantable Lead Location: 753860
Implantable Lead Model: 296
Implantable Lead Model: 4470
Implantable Lead Serial Number: 727876
Implantable Pulse Generator Implant Date: 20140205
Lead Channel Impedance Value: 317 Ohm
Lead Channel Impedance Value: 377 Ohm
Lead Channel Pacing Threshold Amplitude: 1 V
Lead Channel Pacing Threshold Amplitude: 2.3 V
Lead Channel Pacing Threshold Pulse Width: 0.5 ms
Lead Channel Pacing Threshold Pulse Width: 0.5 ms
Lead Channel Setting Pacing Amplitude: 2.5 V
Lead Channel Setting Pacing Amplitude: 3.5 V
Lead Channel Setting Pacing Pulse Width: 0.5 ms
Lead Channel Setting Sensing Sensitivity: 0.6 mV
Pulse Gen Serial Number: 113490

## 2022-06-11 ENCOUNTER — Ambulatory Visit (HOSPITAL_COMMUNITY): Payer: Self-pay | Admitting: Pharmacist

## 2022-06-11 LAB — POCT INR: INR: 1.7 — AB (ref 2.0–3.0)

## 2022-06-11 NOTE — Progress Notes (Signed)
LVAD INR 

## 2022-06-18 NOTE — Progress Notes (Signed)
Remote ICD transmission.   

## 2022-06-19 ENCOUNTER — Ambulatory Visit (HOSPITAL_COMMUNITY): Payer: Self-pay | Admitting: Pharmacist

## 2022-06-19 LAB — POCT INR: INR: 2.9 (ref 2.0–3.0)

## 2022-06-25 ENCOUNTER — Ambulatory Visit (HOSPITAL_COMMUNITY): Payer: Self-pay | Admitting: Pharmacist

## 2022-06-25 LAB — POCT INR: INR: 2.3 (ref 2.0–3.0)

## 2022-07-02 ENCOUNTER — Emergency Department (HOSPITAL_COMMUNITY)
Admission: EM | Admit: 2022-07-02 | Discharge: 2022-07-02 | Disposition: A | Discharge status: Home / Self Care | Payer: Medicare HMO | Attending: Emergency Medicine | Admitting: Emergency Medicine

## 2022-07-02 ENCOUNTER — Other Ambulatory Visit: Payer: Self-pay

## 2022-07-02 ENCOUNTER — Emergency Department (HOSPITAL_COMMUNITY): Payer: Medicare HMO

## 2022-07-02 ENCOUNTER — Encounter (HOSPITAL_COMMUNITY): Payer: Medicare HMO

## 2022-07-02 ENCOUNTER — Telehealth: Payer: Self-pay

## 2022-07-02 ENCOUNTER — Encounter (HOSPITAL_COMMUNITY): Payer: Self-pay | Admitting: Emergency Medicine

## 2022-07-02 DIAGNOSIS — R5383 Other fatigue: Secondary | ICD-10-CM | POA: Insufficient documentation

## 2022-07-02 DIAGNOSIS — R531 Weakness: Secondary | ICD-10-CM | POA: Diagnosis not present

## 2022-07-02 DIAGNOSIS — Z20822 Contact with and (suspected) exposure to covid-19: Secondary | ICD-10-CM | POA: Insufficient documentation

## 2022-07-02 DIAGNOSIS — R051 Acute cough: Secondary | ICD-10-CM | POA: Insufficient documentation

## 2022-07-02 DIAGNOSIS — R0602 Shortness of breath: Secondary | ICD-10-CM | POA: Diagnosis present

## 2022-07-02 LAB — TROPONIN I (HIGH SENSITIVITY)
Troponin I (High Sensitivity): 24 ng/L — ABNORMAL HIGH (ref <18)
Troponin I (High Sensitivity): 23 ng/L — ABNORMAL HIGH (ref <18)

## 2022-07-02 LAB — CBC
HCT: 32.8 % — ABNORMAL LOW (ref 39.0–52.0)
Hemoglobin: 10.3 g/dL — ABNORMAL LOW (ref 13.0–17.0)
MCH: 30 pg (ref 26.0–34.0)
MCHC: 31.4 g/dL (ref 30.0–36.0)
MCV: 95.6 fL (ref 80.0–100.0)
Platelets: 190 10*3/uL (ref 150–400)
RBC: 3.43 MIL/uL — ABNORMAL LOW (ref 4.22–5.81)
RDW: 17.1 % — ABNORMAL HIGH (ref 11.5–15.5)
WBC: 5 10*3/uL (ref 4.0–10.5)
nRBC: 0 % (ref 0.0–0.2)

## 2022-07-02 LAB — URINALYSIS, ROUTINE W REFLEX MICROSCOPIC
Bilirubin Urine: NEGATIVE
Glucose, UA: NEGATIVE mg/dL
Hgb urine dipstick: NEGATIVE
Ketones, ur: NEGATIVE mg/dL
Leukocytes,Ua: NEGATIVE
Nitrite: NEGATIVE
Protein, ur: NEGATIVE mg/dL
Specific Gravity, Urine: 1.009 (ref 1.005–1.030)
pH: 5 (ref 5.0–8.0)

## 2022-07-02 LAB — BASIC METABOLIC PANEL
Anion gap: 7 (ref 5–15)
BUN: 25 mg/dL — ABNORMAL HIGH (ref 8–23)
CO2: 23 mmol/L (ref 22–32)
Calcium: 9.2 mg/dL (ref 8.9–10.3)
Chloride: 108 mmol/L (ref 98–111)
Creatinine, Ser: 2.71 mg/dL — ABNORMAL HIGH (ref 0.61–1.24)
GFR, Estimated: 24 mL/min — ABNORMAL LOW (ref >60)
Glucose, Bld: 92 mg/dL (ref 70–99)
Potassium: 4.1 mmol/L (ref 3.5–5.1)
Sodium: 138 mmol/L (ref 135–145)

## 2022-07-02 LAB — PROTIME-INR
INR: 3 — ABNORMAL HIGH (ref 0.8–1.2)
Prothrombin Time: 30.8 seconds — ABNORMAL HIGH (ref 11.4–15.2)

## 2022-07-02 LAB — RESP PANEL BY RT-PCR (FLU A&B, COVID) ARPGX2
Influenza A by PCR: NEGATIVE
Influenza B by PCR: NEGATIVE
SARS Coronavirus 2 by RT PCR: NEGATIVE

## 2022-07-02 LAB — BRAIN NATRIURETIC PEPTIDE: B Natriuretic Peptide: 501.5 pg/mL — ABNORMAL HIGH (ref 0.0–100.0)

## 2022-07-02 NOTE — Discharge Instructions (Signed)
Your history, exam, work-up today were overall reassuring.  I suspect of a viral infection causing your malaise, soreness, and fatigue and cough.  The x-ray did not show new pneumonia and your labs all appear to be improving from prior were stable.  Given your stability for over 7 hours without any low oxygen saturations, we do feel you are safe for discharge home.  Please follow-up with your LVAD team and PCP.  If any symptoms change or worsen acutely, please return to the nearest emergency department.

## 2022-07-02 NOTE — ED Notes (Signed)
All discharge instructions reviewed with patient including follow up care. Patient stable and ambulatory at time of discharge.

## 2022-07-02 NOTE — Progress Notes (Signed)
LVAD Coordinator ED Encounter  Isaac [name] a 71 y.o. male that presented to [facility] ER today complaining of weakness, shortness of breath and cough. He has a has a past medical history of Arrhythmia, CHF, Chronic kidney disease, Coronary artery disease, Hyperlipidemia, Hypertension, and Myocardial infarct.   LVAD is a HM3 and was implanted on 09/17/21 by Dr. Cyndia Bent.  Patient was met in ED room 10. On assessment patient is alert and oriented. Denies lightheadedness, dizziness, falls, shortness of breath, and signs of bleeding at this time. Patient reports shortness of breath on exertion but does not appear to be in acute distress currently. He reports taking Lasix as prescribed.   Patient was out of town in [place] this past weekend around family and started to feel bad when returning home. Denies fever. Resp panel pending.  VAD interrogated at bedside. No events or alarms present.   Vital signs: HR: 67 Doppler MAP: 92 Automated KP:TWSF documented O2 Sat: 100% RA LVAD interrogation reveals:  Speed: 5350 Flow: 4.4 Power:  3.8 PI: 3.2  Alarms: none PI Events: rare  Drive Line: Drive line CDI. Maintained weekly by wife Isaac [name]. No reported redness, tenderness or drainage at site.  Significant Events with LVAD: Updated VAD Providers Darrick Grinder NP about the above. No LVAD issues and pump is functioning as expected. Able to independently manage LVAD equipment. No LVAD needs at this time.    Bobbye Morton, RN,BSN VAD Coordinator   Office: [phone] 24/7 Emergency VAD Pager: [phone]

## 2022-07-02 NOTE — Telephone Encounter (Signed)
VAD Coordinator received phone call from patient's wife [name] this am requesting a sick visit today. Patient's wife reports patient having increasing weakness, labored breathing and cough. Spoke with Darrick Grinder NP about patient concerns. NP advises for patient to go to the ED to be evaluated. Patient made aware. VAD Coordinator will meet patient on arrival for further assessment.  Bobbye Morton RN, BSN VAD Coordinator 24/7 Pager [phone]

## 2022-07-02 NOTE — ED Provider Notes (Signed)
[facility] EMERGENCY DEPARTMENT Provider Note   CSN: [id number] Arrival date & time: 07/02/22  1225     History  Chief Complaint  Patient presents with   Shortness of Breath   Weakness    Isaac [name] is a 71 y.o. male.  The history is provided by the patient and medical records. No language interpreter was used.  Shortness of Breath Severity:  Moderate Onset quality:  Gradual Duration:  10 days Timing:  Constant Progression:  Unchanged Chronicity:  New Context: URI   Relieved by:  Nothing Worsened by:  Coughing Ineffective treatments:  None tried Associated symptoms: cough   Associated symptoms: no abdominal pain, no chest pain, no diaphoresis, no ear pain, no fever, no headaches, no neck pain, no rash, no sputum production, no vomiting and no wheezing        Home Medications Prior to Admission medications   Medication Sig Start Date End Date Taking? Authorizing Provider  albuterol (VENTOLIN HFA) 108 (90 Base) MCG/ACT inhaler Inhale 2 puffs into the lungs every 6 (six) hours as needed. 05/01/21   [provider]  amiodarone (PACERONE) 200 MG tablet Take 0.5 tablets (100 mg total) by mouth daily. 11/13/21   Larey Dresser, MD  furosemide (LASIX) 40 MG tablet Take 1.5 tablets (60 mg total) by mouth every other day. 05/26/22   Larey Dresser, MD  mirtazapine (REMERON) 7.5 MG tablet Take 1 tablet (7.5 mg total) by mouth at bedtime. 10/24/21   Lyda Jester M, PA-C  Multiple Vitamins-Minerals (CERTAVITE/ANTIOXIDANTS) TABS Take 1 tablet by mouth at bedtime. Patient not taking: Reported on 04/02/2022 01/20/22   Larey Dresser, MD  multivitamin (RENA-VIT) TABS tablet Take 1 tablet by mouth daily. Patient not taking: Reported on 05/26/2022 05/21/22   Larey Dresser, MD  ondansetron (ZOFRAN) 4 MG tablet Take 1 tablet (4 mg total) by mouth every 8 (eight) hours as needed for nausea or vomiting. Patient not taking: Reported on 01/20/2022 11/03/21    Larey Dresser, MD  pantoprazole (PROTONIX) 40 MG tablet TAKE 1 TABLET BY MOUTH DAILY 06/03/22   Lyda Jester M, PA-C  rosuvastatin (CRESTOR) 10 MG tablet TAKE 1 TABLET BY MOUTH DAILY 06/03/22   Lyda Jester M, PA-C  sildenafil (REVATIO) 20 MG tablet Take 1 tablet (20 mg total) by mouth 3 (three) times daily. 11/24/21   Larey Dresser, MD  warfarin (COUMADIN) 2 MG tablet Take 4 mg (2 tabs) every Monday/Wednesday/Friday and 2 mg (1 tab) all other days or as directed by the [facility]. 04/02/22   Larey Dresser, MD      Allergies    Mushroom extract complex and Neosporin [neomycin-bacitracin zn-polymyx]    Review of Systems   Review of Systems  Constitutional:  Negative for chills, diaphoresis, fatigue and fever.  HENT:  Negative for congestion and ear pain.   Eyes:  Negative for visual disturbance.  Respiratory:  Positive for cough and shortness of breath. Negative for sputum production, chest tightness and wheezing.   Cardiovascular:  Negative for chest pain and palpitations.  Gastrointestinal:  Negative for abdominal pain, constipation, diarrhea, nausea and vomiting.  Genitourinary:  Negative for dysuria, flank pain and frequency.  Musculoskeletal:  Negative for back pain, neck pain and neck stiffness.  Skin:  Negative for rash and wound.  Neurological:  Negative for weakness, light-headedness, numbness and headaches.  Psychiatric/Behavioral:  Negative for agitation and confusion.   All other systems reviewed and are negative.  Physical Exam Updated Vital Signs BP (!) 92/0 Comment: Dopplered  Pulse 67   Temp 98.2 F (36.8 C) (Oral)   Resp 18   SpO2 100%  Physical Exam Vitals and nursing note reviewed.  Constitutional:      General: He is not in acute distress.    Appearance: He is well-developed. He is not ill-appearing, toxic-appearing or diaphoretic.  HENT:     Head: Normocephalic and atraumatic.  Eyes:     Conjunctiva/sclera: Conjunctivae normal.      Pupils: Pupils are equal, round, and reactive to light.  Cardiovascular:     Rate and Rhythm: Normal rate and regular rhythm.     Heart sounds: No murmur heard. Pulmonary:     Effort: Pulmonary effort is normal. No respiratory distress.     Breath sounds: Normal breath sounds. No decreased breath sounds, wheezing, rhonchi or rales.  Chest:     Chest wall: No tenderness.  Abdominal:     Palpations: Abdomen is soft.     Tenderness: There is no abdominal tenderness.  Musculoskeletal:        General: No swelling.     Cervical back: Neck supple.     Right lower leg: No tenderness. No edema.     Left lower leg: No tenderness. No edema.  Skin:    General: Skin is warm and dry.     Capillary Refill: Capillary refill takes less than 2 seconds.     Findings: No erythema.  Neurological:     General: No focal deficit present.     Mental Status: He is alert.  Psychiatric:        Mood and Affect: Mood normal.     ED Results / Procedures / Treatments   Labs (all labs ordered are listed, but only abnormal results are displayed) Labs Reviewed  BASIC METABOLIC PANEL - Abnormal; Notable for the following components:      Result Value   BUN 25 (*)    Creatinine, Ser 2.71 (*)    GFR, Estimated 24 (*)    All other components within normal limits  CBC - Abnormal; Notable for the following components:   RBC 3.43 (*)    Hemoglobin 10.3 (*)    HCT 32.8 (*)    RDW 17.1 (*)    All other components within normal limits  PROTIME-INR - Abnormal; Notable for the following components:   Prothrombin Time 30.8 (*)    INR 3.0 (*)    All other components within normal limits  BRAIN NATRIURETIC PEPTIDE - Abnormal; Notable for the following components:   B Natriuretic Peptide 501.5 (*)    All other components within normal limits  TROPONIN I (HIGH SENSITIVITY) - Abnormal; Notable for the following components:   Troponin I (High Sensitivity) 24 (*)    All other components within normal limits   TROPONIN I (HIGH SENSITIVITY) - Abnormal; Notable for the following components:   Troponin I (High Sensitivity) 23 (*)    All other components within normal limits  RESP PANEL BY RT-PCR (FLU A&B, COVID) [id number]  URINALYSIS, ROUTINE W REFLEX MICROSCOPIC    EKG EKG Interpretation  Date/Time:  Thursday July 02 2022 12:27:55 EDT Ventricular Rate:  72 PR Interval:    QRS Duration: 190 QT Interval:  578 QTC Calculation: 632 R Axis:   -83 Text Interpretation: Ventricular-paced rhythm with occasional AV dual-paced complexes and with occasional Premature ventricular complexes Abnormal ECG When compared with ECG of 30-Oct-2021 11:48, PREVIOUS ECG IS PRESENT when  compared to prior, similar appearance with significant artifact. No STEMI Confirmed by Antony Blackbird [phone]) on 07/02/2022 4:02:46 PM  Radiology DG Chest 2 View  Result Date: 07/02/2022 CLINICAL DATA:  Chest pain.  Shortness of breath, weakness. EXAM: CHEST - 2 VIEW COMPARISON:  Radiographs 05/21/2022 and 10/11/2021.  CT 09/09/2021. FINDINGS: The heart size and mediastinal contours are stable status post median sternotomy. Left subclavian AICD leads and a left ventricular assist device appear unchanged. There is vascular congestion without overt pulmonary edema. No confluent airspace opacity, pleural effusion or pneumothorax. Surgical clips are present in the right subpectoral region. The bones appear unchanged. IMPRESSION: Stable postoperative chest. Vascular congestion without overt pulmonary edema or acute cardiopulmonary process. Electronically Signed   By: Richardean Sale M.D.   On: 07/02/2022 13:59    Procedures Procedures    Medications Ordered in ED Medications - No data to display  ED Course/ Medical Decision Making/ A&P                           Medical Decision Making   Apostolos Blagg is a 71 y.o. male with a past medical history significant for CAD, hypertension, hyperlipidemia, CHF status post LVAD  placement, and atrial fibrillation on Coumadin therapy who presents for approximately 10 days of fatigue, chills, shortness of breath, and cough.  Patient reports that he has had no trouble with his LVAD and he has had no sick contacts.  He reports he has had some cough and there is some intermittent production.  He reports no documented fevers but has chills.  He reports occasional nausea but no vomiting.  Denies any constipation, diarrhea, or urinary changes although patient says this is similar to how he felt when he had a UTI in the past.  Denies any tick exposures or rashes.  Denies any pain at this time.  He denies any palpitations or chest pain during his shortness of breath episodes.  On exam, lungs were clear and I do hear the report of his LVAD.  No significant rales or rhonchi.  Chest was nontender.  No wheezing.  Abdomen nontender.  Patient moving all extremities and otherwise is well-appearing.  Vital signs revealed no fever, tachycardia or tachypnea.  Patient resting comfortably.  EKG appears similar to prior with artifact from his LVAD.  We will have work-up including labs, chest x-ray, urinalysis, and COVID test to look for causes of his cough, shortness breath, chills.  This very well could be a viral URI causing his symptoms.  Anticipate discussion with LVAD team after work-up is completed to determine disposition.  Work-up began to return and was overall reassuring.  COVID and flu negative.  X-ray shows no pneumonia or other acute abnormality.  BNP is improved from prior.  Troponin is stable x2.  Urinalysis does not show UTI as family suspected.  Otherwise kidney function is stable and CBC also stable.  Given patient's well appearance and stability for over 7-1/2 hours, they would like to go home.  I suspect he may have a viral infection causing symptoms but we do feel a safer discharge home.  We offered to call the LVAD team but they would rather go home and talk to the LVAD team  tomorrow given the reassuring work-up.  Given his well appearance, patient discharged in good condition with understanding of return precautions and follow-up instructions.        Final Clinical Impression(s) / ED Diagnoses Final diagnoses:  Acute cough  Fatigue, unspecified type    Rx / DC Orders ED Discharge Orders     None       Clinical Impression: 1. Acute cough   2. Fatigue, unspecified type     Disposition: Discharge  Condition: Good  I have discussed the results, Dx and Tx plan with the pt(& family if present). He/she/they expressed understanding and agree(s) with the plan. Discharge instructions discussed at great length. Strict return precautions discussed and pt &/or family have verbalized understanding of the instructions. No further questions at time of discharge.    New Prescriptions   No medications on file    Follow Up: [name], MD [address] [place] [address] [phone]     your LVAD team     [facility] [address] [id number] mc [place]       Isaac [name], Gwenyth Allegra, MD 07/02/22 2008

## 2022-07-02 NOTE — ED Triage Notes (Signed)
Pt complains of SOB, cough, weakness, lethargy for about a week. Pt has an LVAD- his team is aware he is here. Denies swelling in legs. Pt's significant other states she believes his abd is more swollen than normal.

## 2022-07-02 NOTE — ED Provider Triage Note (Signed)
Emergency Medicine Provider Triage Evaluation Note  Isaac [name] , a 71 y.o. male  was evaluated in triage.  Pt complains of worsening fatigue, shortness of breath, and new cough.  Symptoms started gradually 10 days ago.  Denies fever, N/V/D, abdominal pain, though notes possible bloating in his abdomen.  Now also with decreased appetite.  Hx of LVAD.  Denies chest pain or chest discomfort.  Review of Systems  Positive:  Negative: See above  Physical Exam  BP (!) 92/0 Comment: Dopplered  Pulse 67   Temp 98.2 F (36.8 C) (Oral)   Resp 18   SpO2 100%  Gen:   Awake, no distress   Resp:  Normal effort equal chest rise MSK:   Moves extremities without difficulty  Other:  Abdomen soft, nontender.  Chest non-TTP.  Medical Decision Making  Medically screening exam initiated at 1:19 PM.  Appropriate orders placed.  [name] was informed that the remainder of the evaluation will be completed by another provider, this initial triage assessment does not replace that evaluation, and the importance of remaining in the ED until their evaluation is complete.     Prince Rome, PA-C 70/35/00 1323

## 2022-07-03 ENCOUNTER — Other Ambulatory Visit: Payer: Self-pay

## 2022-07-03 ENCOUNTER — Ambulatory Visit (HOSPITAL_COMMUNITY): Payer: Self-pay | Admitting: Pharmacist

## 2022-07-03 MED ORDER — ALBUTEROL SULFATE HFA 108 (90 BASE) MCG/ACT IN AERS: 2.0000 | INHALATION_SPRAY | Freq: Four times a day (QID) | RESPIRATORY_TRACT | 3 refills | Status: DC | PRN

## 2022-07-03 NOTE — Progress Notes (Signed)
Patient called and stated he is feeling better today. ED physician suspected viral URI. Requesting a refill on albuterol inhaler. Will refill today. Patient states he does not feel as if he needs a sick visit next week but will call if symptoms worsen.  Bobbye Morton RN, BSN VAD Coordinator 24/7 Pager [phone]

## 2022-07-10 ENCOUNTER — Ambulatory Visit (HOSPITAL_COMMUNITY): Payer: Self-pay | Admitting: Pharmacist

## 2022-07-10 LAB — POCT INR: INR: 2.8 (ref 2.0–3.0)

## 2022-07-17 ENCOUNTER — Ambulatory Visit (HOSPITAL_COMMUNITY): Payer: Self-pay | Admitting: Pharmacist

## 2022-07-17 LAB — POCT INR: INR: 2.1 (ref 2.0–3.0)

## 2022-07-23 ENCOUNTER — Ambulatory Visit (HOSPITAL_COMMUNITY): Payer: Self-pay | Admitting: Pharmacist

## 2022-07-23 LAB — POCT INR: INR: 2.1 (ref 2.0–3.0)

## 2022-07-28 ENCOUNTER — Other Ambulatory Visit (HOSPITAL_COMMUNITY): Payer: Self-pay

## 2022-07-28 ENCOUNTER — Encounter (HOSPITAL_COMMUNITY): Payer: Medicare HMO

## 2022-07-28 DIAGNOSIS — I5022 Chronic systolic (congestive) heart failure: Secondary | ICD-10-CM

## 2022-07-28 DIAGNOSIS — Z7901 Long term (current) use of anticoagulants: Secondary | ICD-10-CM

## 2022-07-28 DIAGNOSIS — Z95811 Presence of heart assist device: Secondary | ICD-10-CM

## 2022-07-29 ENCOUNTER — Ambulatory Visit (HOSPITAL_COMMUNITY)
Admission: RE | Admit: 2022-07-29 | Discharge: 2022-07-29 | Disposition: A | Discharge status: Home / Self Care | Payer: Medicare HMO | Source: Ambulatory Visit | Attending: Cardiology | Admitting: Cardiology

## 2022-07-29 ENCOUNTER — Encounter (HOSPITAL_COMMUNITY): Payer: Self-pay

## 2022-07-29 ENCOUNTER — Ambulatory Visit (HOSPITAL_COMMUNITY): Payer: Self-pay | Admitting: Pharmacist

## 2022-07-29 DIAGNOSIS — Z955 Presence of coronary angioplasty implant and graft: Secondary | ICD-10-CM | POA: Insufficient documentation

## 2022-07-29 DIAGNOSIS — I132 Hypertensive heart and chronic kidney disease with heart failure and with stage 5 chronic kidney disease, or end stage renal disease: Secondary | ICD-10-CM | POA: Insufficient documentation

## 2022-07-29 DIAGNOSIS — N184 Chronic kidney disease, stage 4 (severe): Secondary | ICD-10-CM | POA: Insufficient documentation

## 2022-07-29 DIAGNOSIS — Z95811 Presence of heart assist device: Secondary | ICD-10-CM | POA: Diagnosis not present

## 2022-07-29 DIAGNOSIS — I9589 Other hypotension: Secondary | ICD-10-CM | POA: Diagnosis not present

## 2022-07-29 DIAGNOSIS — N179 Acute kidney failure, unspecified: Secondary | ICD-10-CM | POA: Insufficient documentation

## 2022-07-29 DIAGNOSIS — Z7901 Long term (current) use of anticoagulants: Secondary | ICD-10-CM | POA: Diagnosis not present

## 2022-07-29 DIAGNOSIS — Z79899 Other long term (current) drug therapy: Secondary | ICD-10-CM | POA: Insufficient documentation

## 2022-07-29 DIAGNOSIS — I5022 Chronic systolic (congestive) heart failure: Secondary | ICD-10-CM | POA: Insufficient documentation

## 2022-07-29 DIAGNOSIS — I2510 Atherosclerotic heart disease of native coronary artery without angina pectoris: Secondary | ICD-10-CM | POA: Insufficient documentation

## 2022-07-29 LAB — BASIC METABOLIC PANEL
Anion gap: 9 (ref 5–15)
BUN: 26 mg/dL — ABNORMAL HIGH (ref 8–23)
CO2: 25 mmol/L (ref 22–32)
Calcium: 9.2 mg/dL (ref 8.9–10.3)
Chloride: 106 mmol/L (ref 98–111)
Creatinine, Ser: 2.64 mg/dL — ABNORMAL HIGH (ref 0.61–1.24)
GFR, Estimated: 25 mL/min — ABNORMAL LOW (ref >60)
Glucose, Bld: 85 mg/dL (ref 70–99)
Potassium: 3.5 mmol/L (ref 3.5–5.1)
Sodium: 140 mmol/L (ref 135–145)

## 2022-07-29 LAB — CBC
HCT: 33.8 % — ABNORMAL LOW (ref 39.0–52.0)
Hemoglobin: 11.1 g/dL — ABNORMAL LOW (ref 13.0–17.0)
MCH: 31.4 pg (ref 26.0–34.0)
MCHC: 32.8 g/dL (ref 30.0–36.0)
MCV: 95.5 fL (ref 80.0–100.0)
Platelets: 200 10*3/uL (ref 150–400)
RBC: 3.54 MIL/uL — ABNORMAL LOW (ref 4.22–5.81)
RDW: 15.9 % — ABNORMAL HIGH (ref 11.5–15.5)
WBC: 3.5 10*3/uL — ABNORMAL LOW (ref 4.0–10.5)
nRBC: 0 % (ref 0.0–0.2)

## 2022-07-29 LAB — HEPATIC FUNCTION PANEL
ALT: 21 U/L (ref 0–44)
AST: 18 U/L (ref 15–41)
Albumin: 3.7 g/dL (ref 3.5–5.0)
Alkaline Phosphatase: 138 U/L — ABNORMAL HIGH (ref 38–126)
Bilirubin, Direct: 0.4 mg/dL — ABNORMAL HIGH (ref 0.0–0.2)
Indirect Bilirubin: 1.1 mg/dL — ABNORMAL HIGH (ref 0.3–0.9)
Total Bilirubin: 1.5 mg/dL — ABNORMAL HIGH (ref 0.3–1.2)
Total Protein: 7.3 g/dL (ref 6.5–8.1)

## 2022-07-29 LAB — PROTIME-INR
INR: 2.1 — ABNORMAL HIGH (ref 0.8–1.2)
Prothrombin Time: 23.4 seconds — ABNORMAL HIGH (ref 11.4–15.2)

## 2022-07-29 LAB — LACTATE DEHYDROGENASE: LDH: 177 U/L (ref 98–192)

## 2022-07-29 MED ORDER — FUROSEMIDE 40 MG PO TABS: 60.0000 mg | ORAL_TABLET | Freq: Every day | ORAL | 3 refills | Status: DC

## 2022-07-29 NOTE — Patient Instructions (Addendum)
Take Lasix 80 mg (2 tablets) for 3 days. Then decrease dose to Lasix 60 mg (1.5 tablets) daily.  Coumadin dosing per [place] a low sodium diet  Increase your physical activity! [facility] does not have a maintenance cardiac rehab program, but they do have [facility] that many of their program graduates attend. [phone]  Return to clinic in 10 days for repeat labs Return to [place] clinic in 2 months. We will complete your 1 year Intermacs and annual maintenance at this visit. Please wear tennis shoes for 6 minute walk.   Provided patient with shower bag for home use. Demonstration along with written instructions and illustrated steps provided.  Patient and caregiver verbalized understanding of same.  Here are some tips for washing:  Avoid getting the driveline exit site dressing wet, and consider planning bathing times around exit site dressing changes. Use only the shower bag we gave you to shower with and don't get creative with your equipment to shower. Water and electricity do not mix and will cause your pump to stop.   Sit on a chair or bathing stool in the bathtub or shower stall, and use a basin of warm water and washcloth or sponge to wash. Or, wash at the sink while standing on a towel, so as not to get the floor or bath rug wet. To wash your hair, try using a hand-held shower wand or sprayer while standing over the kitchen sink or bathtub. Be sure that all floor surfaces are dry when walking around after bathing, to avoid slipping. Do not use powder around the exit site dressing. Anytime your dressing appears wet CHANGE IT! Drink 2 large glasses of water prior to getting in shower for first time (always hydrate before shower). Caregiver needs to be accessible during first few showers. Call VAD Coordinator if any changes in appearance of drive line site.

## 2022-07-29 NOTE — Progress Notes (Addendum)
Patient presents for 2 month f/u in [facility] with his wife. Denies issues/concerns with VAD equipment or drive line.   Pt states he has been feeling better over the last few weeks. His main complaint is fatigue. He feels like since he completed cardiac rehab he has felt like his energy level has been minimal. Discussed need to increase activity level. Provided with [employer] phone number to discuss membership options.   Denies lightheadedness, dizziness, falls, and signs of bleeding. Treated for URI last month, and states shortness of breath has slowly improved since then. States at times he is wheezy. Reports he used his Albuterol inhaler last week "it made me really sick so I won't use it again." Says today is the best he has felt shortness of breath wise in a while.   Reports he has been taking Lasix 80 mg daily for the last several days instead of Lasix 60 mg every other day. States he increased his dose because he felt like he had extra fluid on board. Weight unchanged from last visit. Volume slightly up from baseline per Isaac [name]. Per Isaac [name] pt to take Lasix 80 mg x 3 days, then decrease to Lasix 60 mg daily. [facility] wife's request. Will repeat BMET in 2 weeks.   Provided shower education and shower bag today today. Pt's wife has been changing dressing weekly. Denies any issues. See documentation below.   Vital Signs:  Temp:  Doppler Pressure: 82 Automatc BP: 104/53 (76) HR: 74  SPO2: 99% on RA   Weight: 182 lb w/ eqt Last weight: 182 lb w/eqt   VAD Indication: Destination Therapy due to age 71 and kidney disease   LVAD assessment:  HM III: Speed: 5300 rpms Flow: 4.3 Power: 3.9 w    PI: 3.4 Alarms: none Events: rare PI events  Fixed speed: 5300 Low speed limit: 5000  Primary controller: back up battery due for replacement in 22 months Secondary controller:  back up battery due for replacement in 24 months   I reviewed the LVAD  parameters from today and compared the results to the patient's prior recorded data. LVAD interrogation was NEGATIVE for significant power changes, NEGATIVE for clinical alarms and STABLE for PI events/speed drops. No programming changes were made and pump is functioning within specified parameters. Pt is performing daily controller and system monitor self tests along with completing weekly and monthly maintenance for LVAD equipment.   LVAD equipment check completed and is in good working order. Back-up equipment present. Charged back up battery and performed self-test on equipment.    Annual Equipment Maintenance on [facility]/PM was performed on 10/2021.   Exit Site Care: Wife has been performing weekly dressing changes using weekly dressings. Drive line anchor correctly applied. Pt given 12 weekly kits and 10 anchors for home use.  Device:  [employer] dual  Therapies: on Pacing: AV paced 60 >> increased to 70  Last check: 05/21/22 in [facility]    BP & Labs:  Doppler 74 - Doppler is reflecting MAP   Hgb 11.1 - No S/S of bleeding. Specifically denies melena/BRBPR or nosebleeds.   LDH stable at 177 with established baseline of 200 - 300. Denies tea-colored urine. No power elevations noted on interrogation.   Provided patient with shower bag for home use. Demonstration along with written instructions and illustrated steps provided.  Patient and caregiver verbalized understanding of same.  Here are some tips for washing:  Avoid getting the driveline  exit site dressing wet, and consider planning bathing times around exit site dressing changes. Use only the shower bag we gave you to shower with and don't get creative with your equipment to shower. Water and electricity do not mix and will cause your pump to stop.   Sit on a chair or bathing stool in the bathtub or shower stall, and use a basin of warm water and washcloth or sponge to wash. Or, wash at the sink while standing on a towel, so as  not to get the floor or bath rug wet. To wash your hair, try using a hand-held shower wand or sprayer while standing over the kitchen sink or bathtub. Be sure that all floor surfaces are dry when walking around after bathing, to avoid slipping. Do not use powder around the exit site dressing. Anytime your dressing appears wet CHANGE IT! Drink 2 large glasses of water prior to getting in shower for first time (always hydrate before shower). Caregiver needs to be accessible during first few showers. Call VAD Coordinator if any changes in appearance of drive line site.     Patient Instructions:  Take Lasix 80 mg (2 tablets) for 3 days. Then decrease dose to Lasix 60 mg (1.5 tablets) daily.  2. Coumadin dosing per [name] PharmD 3. Eat a low sodium diet  4. Increase your physical activity! [facility] does not have a maintenance cardiac rehab program, but they do have [facility] that many of their program graduates attend. [phone]  5. Return to clinic in 10 days for repeat labs 6. Return to [facility] in 2 months. We will complete your 1 year Intermacs and annual  maintenance at this visit. Please wear tennis shoes for 6 minute walk.   Emerson Monte RN Industry Coordinator  Office: [phone]  24/7 Pager: [phone]    Cardiology: Isaac. Aundra [name] PCP: Isaac Pink, MD  Follow up for Heart Failure/LVAD: 71 y.o. with history of CAD, HTN, atrial fibrillation, and chronic systolic CHF returns for followup of CHF/LVAD.   Patient has had a long-standing cardiomyopathy, EF had been in the 25% range for years.  He had PCI to OM1 in 2007 and RCA in 2013.  From his report, these episodes do not sound like ACS events.  Echo in 12/22 showed EF < 20%, severe LV dilation, restrictive diastolic function, moderate RV dysfunction, moderate MR, mod-severe TR. HF was been complicated by CKD stage 3. He was also noted on device interrogation to have been in atrial fibrillation persistently since 10/22.  He was  admitted at [facility] in 12/22.  BP was low, his BP-active meds were stopped and midodrine was begun.  He was diuresed and discharged home.    He reports ongoing severe dyspnea and was admitted again in 12/22 after he stood up then passed out at home.  Device interrogation showed run of VT treated x 2 with ATP possibly around the time of syncope.  RHC in 12/22 showed low output HF and he developed cardiogenic shock. Impella 5.5 was placed.  On 09/17/21, HM3 LVAD was placed, ICD leads were plastered to the tricuspid valve with severe TR, the valve was not replaced.  The patient had significant interoperative hypotension.  Post-operatively, he developed progressive renal failure and was started on CVVH then iHD.  He had a bumpy post-op course with renal failure and RV failure, but was ultimately able to discharge home on 10/24/21.   Ramp echo in 3/23, RV mildly dilated with normal systolic function, severe TR, LV  EF 20-25%, aortic valve opens every beat.  Speed increased to 5300 rpm. The IV septum was mildly leftwards at 5300 rpm but flow increased and AoV still opened every beat.   Patient returns for LVAD followup. He has remained off HD.  Creatinine has been stable in the mid-2's.  No dyspnea walking into the office today, but he has noted dyspnea with moderate exertion like mowing the grass.  He has noted occasional orthopnea (not every night).  He took Lasix 80 mg for a couple of days but generally has been taking Lasix 40 mg daily.  He is not getting much exercise since he completed cardiac rehab.  MAP 76.    Denies LVAD alarms.  Denies driveline trauma, erythema or drainage.  Denies ICD shocks.   Reports taking Coumadin as prescribed and adherence to anticoagulation based dietary restrictions.  Denies bright red blood per rectum or melena, no dark urine or hematuria.    Labs (3/23): LFTs normal, TSH mildly elevated Labs (7/23): hgb 12.1 Labs (8/23): hgb 11.8 => 9.9, K 4.4, creatinine 2.5 => 3.09, LFTs  normal.   PMH: 1. HTN 2. Hyperlipidemia 3. CAD: H/o PCI to [place] 2007 and PCI to RCA in 2013.  Uncertain of circumstances but do not sound like ACS events.  4. CKD stage IV: Patient was on HD for several months post-LVAD but was able to come off.  5. Atrial fibrillation: Paroxysmal 6. Chronic systolic CHF: Long-standing cardiomyopathy with EF in the 25% range.  Cause uncertain, has history of PCI to RCA and OM1, but degree of CAD does not seem to explain severity of cardiomyopathy. Father had history of CAD/CHF.  Not heavy drinker. Has [employer] ICD.  - Echo (12/22): EF < 20%, severe LV dilation, restrictive diastolic function, moderate RV dysfunction, moderate MR, mod-severe TR.  - 12/22 cardiogenic shock with Impella 5.5 placement then Heartmate 3 LVAD.  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  7. VT 8. RV failure/severe TR 9. Citrobacter UTI (8/23)   Current Outpatient Medications  Medication Sig Dispense Refill   albuterol (VENTOLIN HFA) 108 (90 Base) MCG/ACT inhaler Inhale 2 puffs into the lungs every 6 (six) hours as needed. 1 each 3   amiodarone (PACERONE) 200 MG tablet Take 0.5 tablets (100 mg total) by mouth daily. 30 tablet 6   pantoprazole (PROTONIX) 40 MG tablet TAKE 1 TABLET BY MOUTH DAILY 90 tablet 0   rosuvastatin (CRESTOR) 10 MG tablet TAKE 1 TABLET BY MOUTH DAILY 90 tablet 0   sildenafil (REVATIO) 20 MG tablet Take 1 tablet (20 mg total) by mouth 3 (three) times daily. 90 tablet 6   warfarin (COUMADIN) 2 MG tablet Take 4 mg (2 tabs) every Monday/Wednesday/Friday and 2 mg (1 tab) all other days or as directed by the [facility]. 120 tablet 11   furosemide (LASIX) 40 MG tablet Take 1.5 tablets (60 mg total) by mouth daily. 70 tablet 3   mirtazapine (REMERON) 7.5 MG tablet Take 1 tablet (7.5 mg total) by mouth at bedtime. (Patient not taking: Reported on 07/29/2022) 30 tablet 6   Multiple Vitamins-Minerals  (CERTAVITE/ANTIOXIDANTS) TABS Take 1 tablet by mouth at bedtime. (Patient not taking: Reported on 04/02/2022) 30 tablet 11   multivitamin (RENA-VIT) TABS tablet Take 1 tablet by mouth daily. (Patient not taking: Reported on 05/26/2022) 30 tablet 11   ondansetron (ZOFRAN) 4 MG tablet Take 1 tablet (4 mg total) by mouth every 8 (eight) hours as  needed for nausea or vomiting. (Patient not taking: Reported on 01/20/2022) 40 tablet 3   No current facility-administered medications for this encounter.    Mushroom extract complex and Neosporin [neomycin-bacitracin zn-polymyx]  REVIEW OF SYSTEMS: All systems negative except as listed in HPI, PMH and Problem list.   LVAD INTERROGATION:  See LVAD nurse's note above.   I reviewed the LVAD parameters from today, and compared the results to the patient's prior recorded data.  No programming changes were made.  The LVAD is functioning within specified parameters.  The patient performs LVAD self-test daily.  LVAD interrogation was negative for any significant power changes, alarms or PI events/speed drops.  LVAD equipment check completed and is in good working order.  Back-up equipment present.   LVAD education done on emergency procedures and precautions and reviewed exit site care.    Vitals:   07/29/22 1115 07/29/22 1120  BP: (!) 82/0 (!) 104/58  Pulse: 74   SpO2: 99%   Weight: 82.6 kg (182 lb)   MAP 76  Physical Exam: General: Well appearing this am. NAD.  HEENT: Normal. Neck: Supple, JVP 9-10 cm. Carotids OK.  Cardiac:  Mechanical heart sounds with LVAD hum present.  Lungs:  CTAB, normal effort.  Abdomen:  NT, ND, no HSM. No bruits or masses. +BS  LVAD exit site: Well-healed and incorporated. Dressing dry and intact. No erythema or drainage. Stabilization device present and accurately applied. Driveline dressing changed daily per sterile technique. Extremities:  Warm and dry. No cyanosis, clubbing, rash. 1+ ankle edema.  Neuro:  Alert & oriented x  3. Cranial nerves grossly intact. Moves all 4 extremities w/o difficulty. Affect pleasant    ASSESSMENT AND PLAN: 1. Chronic systolic CHF:  Long-standing cardiomyopathy.  [place].  Echo in 12/22 with EF < 20%, severe LV dilation, restrictive diastolic function, moderate RV dysfunction, moderate MR, mod-severe TR. Cause of cardiomyopathy is uncertain.  He has a history of CAD, but I do not think that the described CAD from the past could explain his cardiomyopathy, but CAD could have progressed.  He was admitted in 12/22 with cardiogenic shock and Impella 5.5. s/p HM III VAD on 09/17/22.  Post-op course complicated by AKI and RV failure. He was on HD for several months but is now off.  NYHA class II-III with mild volume overload on exam. .   - Increase Lasix to 80 mg daily x 3 days then 60 mg daily after that. BMET today and in 10 days.  - Continue sildenafil 20 tid for RV.   - Warfarin for INR 2-2.5.   - Increase exercise, would like him walking or going to gym most days of the week.  2. Tricuspid regurgitation:  Tricuspid repair not done at time of VAD due to proximity of ICD wires and hypotension during surgery.  He has severe TR.  3. Atrial fibrillation: Regular rhythm. - Continue amiodarone 100 mg daily, check LFTs and TSH.  He will need a regular eye exam. 4. CKD stage IV: Suspect intra-op hypotension at the time of LVAD placement led to development of ATN => urine sediment looked like ATN per renal. Had initial CVVH and transitioned over to HD. He is now off HD with creatinine stable, most recently 2.74.   - Follow creatinine closely with increase in Lasix.  5. CAD: History of PCI to OM1 in 2007 and RCA in 2013.  No CP or ACs.  - Deferred cardiac cath in 12/22 due to AKI and  plan for VAD - Continue Crestor.    Followup in 2 months.   Loralie Champagne 07/29/2022

## 2022-07-30 ENCOUNTER — Other Ambulatory Visit (HOSPITAL_COMMUNITY): Payer: Self-pay | Admitting: *Deleted

## 2022-07-30 DIAGNOSIS — I5022 Chronic systolic (congestive) heart failure: Secondary | ICD-10-CM

## 2022-07-30 DIAGNOSIS — Z95811 Presence of heart assist device: Secondary | ICD-10-CM

## 2022-07-30 MED ORDER — POTASSIUM CHLORIDE CRYS ER 20 MEQ PO TBCR: 20.0000 meq | EXTENDED_RELEASE_TABLET | Freq: Every day | ORAL | 6 refills | Status: DC

## 2022-07-30 NOTE — Progress Notes (Signed)
Labs 07/30/22. Per Dr Aundra Dubin would add KCl 20 mEq daily to his Lasix regimen.   Prescription sent to pt's pharmacy.   Spoke with pt regarding the above medication change. He verbalized understanding. Will plan to recheck BMET next week as previously scheduled.  Emerson Monte RN [place] Coordinator  Office: [phone]  24/7 Pager: [phone]

## 2022-08-06 ENCOUNTER — Ambulatory Visit (HOSPITAL_COMMUNITY): Payer: Self-pay | Admitting: Pharmacist

## 2022-08-06 LAB — POCT INR: INR: 1.3 — AB (ref 2.0–3.0)

## 2022-08-06 MED ORDER — ENOXAPARIN SODIUM 40 MG/0.4ML IJ SOSY: 40.0000 mg | PREFILLED_SYRINGE | Freq: Two times a day (BID) | INTRAMUSCULAR | 0 refills | Status: DC

## 2022-08-06 NOTE — Progress Notes (Signed)
LVAD INR 

## 2022-08-07 ENCOUNTER — Ambulatory Visit (HOSPITAL_COMMUNITY)
Admission: RE | Admit: 2022-08-07 | Discharge: 2022-08-07 | Disposition: A | Discharge status: Home / Self Care | Payer: Medicare HMO | Source: Ambulatory Visit | Attending: Cardiology | Admitting: Cardiology

## 2022-08-07 DIAGNOSIS — Z95811 Presence of heart assist device: Secondary | ICD-10-CM

## 2022-08-07 LAB — BASIC METABOLIC PANEL
Anion gap: 12 (ref 5–15)
BUN: 37 mg/dL — ABNORMAL HIGH (ref 8–23)
CO2: 25 mmol/L (ref 22–32)
Calcium: 9.4 mg/dL (ref 8.9–10.3)
Chloride: 103 mmol/L (ref 98–111)
Creatinine, Ser: 2.95 mg/dL — ABNORMAL HIGH (ref 0.61–1.24)
GFR, Estimated: 22 mL/min — ABNORMAL LOW (ref >60)
Glucose, Bld: 76 mg/dL (ref 70–99)
Potassium: 4.5 mmol/L (ref 3.5–5.1)
Sodium: 140 mmol/L (ref 135–145)

## 2022-08-11 ENCOUNTER — Ambulatory Visit (HOSPITAL_COMMUNITY): Payer: Self-pay | Admitting: Pharmacist

## 2022-08-11 LAB — POCT INR: INR: 3 (ref 2.0–3.0)

## 2022-08-21 ENCOUNTER — Ambulatory Visit (HOSPITAL_COMMUNITY): Payer: Self-pay

## 2022-08-21 LAB — POCT INR: INR: 3 (ref 2.0–3.0)

## 2022-08-21 NOTE — Progress Notes (Signed)
LVAD INR 

## 2022-08-28 ENCOUNTER — Ambulatory Visit (HOSPITAL_COMMUNITY): Payer: Self-pay | Admitting: Pharmacist

## 2022-08-28 LAB — POCT INR: INR: 2.7 (ref 2.0–3.0)

## 2022-09-01 LAB — CUP PACEART REMOTE DEVICE CHECK
Battery Remaining Longevity: 6 mo
Battery Remaining Percentage: 6 %
Brady Statistic RA Percent Paced: 45 %
Brady Statistic RV Percent Paced: 63 %
Date Time Interrogation Session: 20231212031200
HighPow Impedance: 50 Ohm
Implantable Lead Connection Status: 753985
Implantable Lead Connection Status: 753985
Implantable Lead Implant Date: 20140205
Implantable Lead Implant Date: 20140205
Implantable Lead Location: 753859
Implantable Lead Location: 753860
Implantable Lead Model: 296
Implantable Lead Model: 4470
Implantable Lead Serial Number: 727876
Implantable Pulse Generator Implant Date: 20140205
Lead Channel Impedance Value: 355 Ohm
Lead Channel Impedance Value: 400 Ohm
Lead Channel Pacing Threshold Amplitude: 1 V
Lead Channel Pacing Threshold Amplitude: 2.3 V
Lead Channel Pacing Threshold Pulse Width: 0.5 ms
Lead Channel Pacing Threshold Pulse Width: 0.5 ms
Lead Channel Setting Pacing Amplitude: 2.5 V
Lead Channel Setting Pacing Amplitude: 3.5 V
Lead Channel Setting Pacing Pulse Width: 0.5 ms
Lead Channel Setting Sensing Sensitivity: 0.6 mV
Pulse Gen Serial Number: 113490
Zone Setting Status: 755011

## 2022-09-02 ENCOUNTER — Other Ambulatory Visit (HOSPITAL_COMMUNITY): Payer: Self-pay | Admitting: *Deleted

## 2022-09-02 ENCOUNTER — Other Ambulatory Visit: Payer: Self-pay | Admitting: Cardiology

## 2022-09-02 DIAGNOSIS — Z95811 Presence of heart assist device: Secondary | ICD-10-CM

## 2022-09-02 DIAGNOSIS — I5022 Chronic systolic (congestive) heart failure: Secondary | ICD-10-CM

## 2022-09-02 DIAGNOSIS — I4891 Unspecified atrial fibrillation: Secondary | ICD-10-CM

## 2022-09-02 DIAGNOSIS — Z79899 Other long term (current) drug therapy: Secondary | ICD-10-CM

## 2022-09-02 MED ORDER — AMIODARONE HCL 200 MG PO TABS: 200.0000 mg | ORAL_TABLET | Freq: Every day | ORAL | 6 refills | Status: DC

## 2022-09-02 NOTE — Progress Notes (Signed)
Pt called stating Dr Olin Pia office called him this morning and told him he has been in/out of atrial flutter since Sept. Also made pt aware he needs device generator change out (6 months remaining on battery.)   Discussed the above with Dr Aundra Dubin. Order received to increase Amiodarone to 200 mg BID x 7 days, then resume a dose of 200 mg daily. Ok to schedule generator change.   Called and spoke with pt and his wife regarding the above. They verbalized understanding. [facility] [address] [facility].   Emerson Monte RN [place] Coordinator  Office: [phone]  24/7 Pager: [phone]

## 2022-09-03 ENCOUNTER — Ambulatory Visit (HOSPITAL_COMMUNITY): Payer: Self-pay | Admitting: Pharmacist

## 2022-09-03 LAB — POCT INR: INR: 2.2 (ref 2.0–3.0)

## 2022-09-09 ENCOUNTER — Other Ambulatory Visit (HOSPITAL_COMMUNITY): Payer: Self-pay

## 2022-09-17 ENCOUNTER — Ambulatory Visit (HOSPITAL_COMMUNITY): Payer: Self-pay | Admitting: Pharmacist

## 2022-09-17 LAB — POCT INR: INR: 2.7 (ref 2.0–3.0)

## 2022-09-25 ENCOUNTER — Ambulatory Visit (HOSPITAL_COMMUNITY): Payer: Self-pay | Admitting: Pharmacist

## 2022-09-25 LAB — POCT INR: INR: 2.7 (ref 2.0–3.0)

## 2022-09-29 ENCOUNTER — Ambulatory Visit (HOSPITAL_COMMUNITY)
Admission: RE | Admit: 2022-09-29 | Discharge: 2022-09-29 | Disposition: A | Discharge status: Home / Self Care | Payer: Medicare HMO | Source: Ambulatory Visit | Attending: Cardiology | Admitting: Cardiology

## 2022-09-29 DIAGNOSIS — Z95811 Presence of heart assist device: Secondary | ICD-10-CM | POA: Diagnosis present

## 2022-09-29 NOTE — Progress Notes (Signed)
Received page from pt last night stating that he was having a back up battery fault alarm.   Pt presented to clinic this morning for back up battery fault alarm. Confirmed alarm with interrogation. BUB changed to [id number]. Back up battery expires in 26 months.  Pt will f/u at his appt already scheduled for next week.   [name] RN, BSN VAD Coordinator 24/7 Pager [phone]

## 2022-10-02 ENCOUNTER — Ambulatory Visit (HOSPITAL_COMMUNITY): Payer: Self-pay | Admitting: Pharmacist

## 2022-10-02 LAB — POCT INR: INR: 2.2 (ref 2.0–3.0)

## 2022-10-05 ENCOUNTER — Ambulatory Visit (INDEPENDENT_AMBULATORY_CARE_PROVIDER_SITE_OTHER): Payer: Medicare HMO

## 2022-10-05 DIAGNOSIS — I5022 Chronic systolic (congestive) heart failure: Secondary | ICD-10-CM

## 2022-10-06 ENCOUNTER — Other Ambulatory Visit (HOSPITAL_COMMUNITY): Payer: Self-pay | Admitting: *Deleted

## 2022-10-06 DIAGNOSIS — Z95811 Presence of heart assist device: Secondary | ICD-10-CM

## 2022-10-06 DIAGNOSIS — Z79899 Other long term (current) drug therapy: Secondary | ICD-10-CM

## 2022-10-06 DIAGNOSIS — Z7901 Long term (current) use of anticoagulants: Secondary | ICD-10-CM

## 2022-10-06 LAB — CUP PACEART REMOTE DEVICE CHECK
Battery Remaining Longevity: 4 mo
Battery Remaining Percentage: 4 %
Brady Statistic RA Percent Paced: 40 %
Brady Statistic RV Percent Paced: 60 %
Date Time Interrogation Session: 20240115033800
HighPow Impedance: 50 Ohm
Implantable Lead Connection Status: 753985
Implantable Lead Connection Status: 753985
Implantable Lead Implant Date: 20140205
Implantable Lead Implant Date: 20140205
Implantable Lead Location: 753859
Implantable Lead Location: 753860
Implantable Lead Model: 296
Implantable Lead Model: 4470
Implantable Lead Serial Number: 727876
Implantable Pulse Generator Implant Date: 20140205
Lead Channel Impedance Value: 351 Ohm
Lead Channel Impedance Value: 415 Ohm
Lead Channel Pacing Threshold Amplitude: 1 V
Lead Channel Pacing Threshold Amplitude: 2.3 V
Lead Channel Pacing Threshold Pulse Width: 0.5 ms
Lead Channel Pacing Threshold Pulse Width: 0.5 ms
Lead Channel Setting Pacing Amplitude: 2.5 V
Lead Channel Setting Pacing Amplitude: 3.5 V
Lead Channel Setting Pacing Pulse Width: 0.5 ms
Lead Channel Setting Sensing Sensitivity: 0.6 mV
Pulse Gen Serial Number: 113490
Zone Setting Status: 755011

## 2022-10-07 ENCOUNTER — Ambulatory Visit (HOSPITAL_COMMUNITY)
Admission: RE | Admit: 2022-10-07 | Discharge: 2022-10-07 | Disposition: A | Discharge status: Home / Self Care | Payer: Medicare HMO | Source: Ambulatory Visit | Attending: Cardiology | Admitting: Cardiology

## 2022-10-07 ENCOUNTER — Encounter (HOSPITAL_COMMUNITY): Payer: Medicare HMO

## 2022-10-07 ENCOUNTER — Ambulatory Visit (HOSPITAL_COMMUNITY): Payer: Self-pay | Admitting: Pharmacist

## 2022-10-07 VITALS — BP 86/0 | HR 74 | Ht 69.0 in | Wt 187.8 lb

## 2022-10-07 DIAGNOSIS — I5022 Chronic systolic (congestive) heart failure: Secondary | ICD-10-CM | POA: Diagnosis present

## 2022-10-07 DIAGNOSIS — I2510 Atherosclerotic heart disease of native coronary artery without angina pectoris: Secondary | ICD-10-CM | POA: Diagnosis not present

## 2022-10-07 DIAGNOSIS — I429 Cardiomyopathy, unspecified: Secondary | ICD-10-CM | POA: Insufficient documentation

## 2022-10-07 DIAGNOSIS — Z79899 Other long term (current) drug therapy: Secondary | ICD-10-CM | POA: Diagnosis not present

## 2022-10-07 DIAGNOSIS — N184 Chronic kidney disease, stage 4 (severe): Secondary | ICD-10-CM | POA: Insufficient documentation

## 2022-10-07 DIAGNOSIS — Z7901 Long term (current) use of anticoagulants: Secondary | ICD-10-CM | POA: Diagnosis not present

## 2022-10-07 DIAGNOSIS — Z452 Encounter for adjustment and management of vascular access device: Secondary | ICD-10-CM | POA: Diagnosis not present

## 2022-10-07 DIAGNOSIS — R7989 Other specified abnormal findings of blood chemistry: Secondary | ICD-10-CM

## 2022-10-07 DIAGNOSIS — I130 Hypertensive heart and chronic kidney disease with heart failure and stage 1 through stage 4 chronic kidney disease, or unspecified chronic kidney disease: Secondary | ICD-10-CM | POA: Insufficient documentation

## 2022-10-07 DIAGNOSIS — Z95811 Presence of heart assist device: Secondary | ICD-10-CM

## 2022-10-07 DIAGNOSIS — Z955 Presence of coronary angioplasty implant and graft: Secondary | ICD-10-CM | POA: Insufficient documentation

## 2022-10-07 DIAGNOSIS — Z95810 Presence of automatic (implantable) cardiac defibrillator: Secondary | ICD-10-CM | POA: Diagnosis not present

## 2022-10-07 LAB — COMPREHENSIVE METABOLIC PANEL
ALT: 21 U/L (ref 0–44)
AST: 21 U/L (ref 15–41)
Albumin: 3.7 g/dL (ref 3.5–5.0)
Alkaline Phosphatase: 125 U/L (ref 38–126)
Anion gap: 8 (ref 5–15)
BUN: 35 mg/dL — ABNORMAL HIGH (ref 8–23)
CO2: 23 mmol/L (ref 22–32)
Calcium: 8.9 mg/dL (ref 8.9–10.3)
Chloride: 106 mmol/L (ref 98–111)
Creatinine, Ser: 2.88 mg/dL — ABNORMAL HIGH (ref 0.61–1.24)
GFR, Estimated: 23 mL/min — ABNORMAL LOW (ref >60)
Glucose, Bld: 83 mg/dL (ref 70–99)
Potassium: 4.3 mmol/L (ref 3.5–5.1)
Sodium: 137 mmol/L (ref 135–145)
Total Bilirubin: 0.7 mg/dL (ref 0.3–1.2)
Total Protein: 7.1 g/dL (ref 6.5–8.1)

## 2022-10-07 LAB — PROTIME-INR
INR: 2.4 — ABNORMAL HIGH (ref 0.8–1.2)
Prothrombin Time: 25.6 seconds — ABNORMAL HIGH (ref 11.4–15.2)

## 2022-10-07 LAB — CBC
HCT: 35.8 % — ABNORMAL LOW (ref 39.0–52.0)
Hemoglobin: 11.7 g/dL — ABNORMAL LOW (ref 13.0–17.0)
MCH: 29.8 pg (ref 26.0–34.0)
MCHC: 32.7 g/dL (ref 30.0–36.0)
MCV: 91.3 fL (ref 80.0–100.0)
Platelets: 210 10*3/uL (ref 150–400)
RBC: 3.92 MIL/uL — ABNORMAL LOW (ref 4.22–5.81)
RDW: 15.2 % (ref 11.5–15.5)
WBC: 4.8 10*3/uL (ref 4.0–10.5)
nRBC: 0 % (ref 0.0–0.2)

## 2022-10-07 LAB — LACTATE DEHYDROGENASE: LDH: 176 U/L (ref 98–192)

## 2022-10-07 LAB — TSH: TSH: 5.244 u[IU]/mL — ABNORMAL HIGH (ref 0.350–4.500)

## 2022-10-07 LAB — T4, FREE: Free T4: 1.54 ng/dL — ABNORMAL HIGH (ref 0.61–1.12)

## 2022-10-07 LAB — PREALBUMIN: Prealbumin: 31 mg/dL (ref 18–38)

## 2022-10-07 NOTE — Patient Instructions (Signed)
Below is listed the closest VAD center to [facility], [place] [id number] [address] [place], [place], [place] 2. Coumadin dosing per [name] PharmD 3. Eat a low sodium diet  4. Return to [place] [facility] in 2 months.  5. We will call and get you an appt with DR Caryl Comes to discuss Generator change out for your ICD

## 2022-10-07 NOTE — Progress Notes (Addendum)
Patient presents for 2 month f/u in [facility] with his wife. Denies issues/concerns with VAD equipment or drive line.   Denies falls, and signs of bleeding. Pt states that he does have episodes at times of dizziness when he stands up too fast.   Reports he has been taking Lasix 60 mg daily. Pt tells me today that he is not taking potassium right now.  Pt is currently on Amiodarone 200 mg daily. His TSH is elevated today along with slight elevation in T4. T3 is still pending. We will refer the pt over to Endocrinology for thyroid workup per Dr Aundra Dubin.   We will also get the pt an appt with Dr Caryl Comes to discuss generator change out as he has less than 6 months on his ICD.   Vital Signs:  Doppler Pressure: 86 Automatc BP: 83/67 (73) HR: 74  SPO2: 100% on RA   Weight: 187.8 lb w/ eqt Last weight: 182 lb w/eqt   VAD Indication: Destination Therapy due to age and kidney disease   LVAD assessment:  HM III: Speed: 5300 rpms Flow: 4.9 Power: 3.9 w    PI: 3.2 Alarms: Replace BUB from last week Events: rare PI events  Fixed speed: 5300 Low speed limit: 5000  Primary controller: back up battery due for replacement in 26 months Secondary controller:  back up battery due for replacement in 21 months   I reviewed the LVAD parameters from today and compared the results to the patient's prior recorded data. LVAD interrogation was NEGATIVE for significant power changes, NEGATIVE for clinical alarms and STABLE for PI events/speed drops. No programming changes were made and pump is functioning within specified parameters. Pt is performing daily controller and system monitor self tests along with completing weekly and monthly maintenance for LVAD equipment.   LVAD equipment check completed and is in good working order. Back-up equipment present. Charged back up battery and performed self-test on equipment.    Annual Equipment Maintenance on UBC/PM was performed on 09/2022.   Exit Site  Care: Wife has been performing weekly dressing changes using weekly dressings. Drive line anchor correctly applied. Pt had some skin excoriation from metal portion of driveline. [name] reinforced and double anchored driveline so that metal portion is no longer on his skin. Skin excoriation is clearing. Pt given 8 weekly kits and 8 anchors for home use.  Device:  [employer] dual  Therapies: on Pacing: AV paced 60 >> increased to 70  Last check: 05/21/22 in [facility]    BP & Labs:  Doppler 86 - Doppler is reflecting Modified systolic   Hgb 12.8 - No S/S of bleeding. Specifically denies melena/BRBPR or nosebleeds.   LDH stable at 177 with established baseline of 200 - 300. Denies tea-colored urine. No power elevations noted on interrogation.    Batteries Manufacture Date: Number of uses: Re-calibration  09/03/21 29 Performed by patient  09/03/21 29 Performed by pt   Annual maintenance completed per [employer] on patient's home power module and universal Char[employer] fundraiser.    Backup system controller 11 volt battery charged during visit.   1 year Intermacs follow up completed including:  Quality of Life, KCCQ-12, and Neurocognitive trail making.   Pt completed 1540 feet during 6 minute walk.   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 Coumadin dosing per [name] PharmD 3. Eat a low sodium diet  4. Return to [place] [facility] in 2 months.  5. We will call and get you an appt with DR Caryl Comes to discuss Generator change out for your ICD 6. We will send a referral for endocrinology  [name] RN [place] Coordinator  Office: [phone]  24/7 Pager: [phone]     Cardiology: Dr. Aundra Dubin PCP: [name], MD  Follow up for Heart Failure/LVAD: 72 y.o. with history of CAD,  HTN, atrial fibrillation, and chronic systolic CHF returns for followup of CHF/LVAD.   Patient has had a long-standing cardiomyopathy, EF had been in the 25% range for years.  He had PCI to OM1 in 2007 and RCA in 2013.  From his report, these episodes do not sound like ACS events.  Echo in 12/22 showed EF < 20%, severe LV dilation, restrictive diastolic function, moderate RV dysfunction, moderate MR, mod-severe TR. HF was been complicated by CKD stage 3. He was also noted on device interrogation to have been in atrial fibrillation persistently since 10/22.  He was admitted at [facility] in 12/22.  BP was low, his BP-active meds were stopped and midodrine was begun.  He was diuresed and discharged home.    He reports ongoing severe dyspnea and was admitted again in 12/22 after he stood up then passed out at home.  Device interrogation showed run of VT treated x 2 with ATP possibly around the time of syncope.  RHC in 12/22 showed low output HF and he developed cardiogenic shock. Impella 5.5 was placed.  On 09/17/21, HM3 LVAD was placed, ICD leads were plastered to the tricuspid valve with severe TR, the valve was not replaced.  The patient had significant interoperative hypotension.  Post-operatively, he developed progressive renal failure and was started on CVVH then iHD.  He had a bumpy post-op course with renal failure and RV failure, but was ultimately able to discharge home on 10/24/21.   Ramp echo in 3/23, RV mildly dilated with normal systolic function, severe TR, LV EF 20-25%, aortic valve opens every beat.  Speed increased to 5300 rpm. The IV septum was mildly leftwards at 5300 rpm but flow increased and AoV still opened every beat.   Patient returns for LVAD followup. He has remained off HD.  Creatinine has been stable in the mid-2's.  Weight is up 5 lbs, says he is eating better.  He denies dyspnea with usual activities.  He plans to go to [place] soon to visit family.  He has had a couple of lightheaded  episodes, suspect orthostatic in nature. No orthopnea/PND.  Device interrogation showed no alarms and rare PIs.   Denies LVAD alarms.  Denies driveline trauma, erythema or drainage.  Denies ICD shocks.   Reports taking Coumadin as prescribed and adherence to anticoagulation based dietary restrictions.  Denies bright red blood per rectum or melena, no dark urine or hematuria.    Labs (3/23): LFTs normal, TSH mildly elevated Labs (7/23): hgb 12.1 Labs (8/23): hgb 11.8 => 9.9, K 4.4, creatinine 2.5 => 3.09, LFTs normal.  Labs (11/23): K 4.5, creatinine 2.95  PMH: 1. HTN 2. Hyperlipidemia 3. CAD: H/o PCI to [place] 2007 and PCI to RCA in 2013.  Uncertain of circumstances but do not sound like ACS events.  4. CKD stage IV: Patient was on HD for several months post-LVAD but was able to come off.  5. Atrial fibrillation: Paroxysmal 6. Chronic systolic CHF: Long-standing cardiomyopathy with EF in  the 25% range.  Cause uncertain, has history of PCI to RCA and OM1, but degree of CAD does not seem to explain severity of cardiomyopathy. Father had history of CAD/CHF.  Not heavy drinker. Has [employer] ICD.  - Echo (12/22): EF < 20%, severe LV dilation, restrictive diastolic function, moderate RV dysfunction, moderate MR, mod-severe TR.  - 12/22 cardiogenic shock with Impella 5.5 placement then Heartmate 3 LVAD.  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  7. VT 8. RV failure/severe TR 9. Citrobacter UTI (8/23)   Current Outpatient Medications  Medication Sig Dispense Refill   amiodarone (PACERONE) 200 MG tablet Take 1 tablet (200 mg total) by mouth daily. Take 200 mg twice a day x 7 days, then resume a dose of 200 mg daily. 30 tablet 6   furosemide (LASIX) 40 MG tablet Take 1.5 tablets (60 mg total) by mouth daily. 70 tablet 3   pantoprazole (PROTONIX) 40 MG tablet TAKE 1 TABLET BY MOUTH EVERY DAY 90 tablet 0   rosuvastatin (CRESTOR) 10 MG tablet TAKE 1 TABLET  BY MOUTH EVERY DAY 90 tablet 0   sildenafil (REVATIO) 20 MG tablet TAKE 1 TABLET BY MOUTH THREE TIMES DAILY 270 tablet 2   warfarin (COUMADIN) 2 MG tablet Take 4 mg (2 tabs) every Monday/Wednesday/Friday and 2 mg (1 tab) all other days or as directed by the [facility]. 120 tablet 11   albuterol (VENTOLIN HFA) 108 (90 Base) MCG/ACT inhaler Inhale 2 puffs into the lungs every 6 (six) hours as needed. (Patient not taking: Reported on 10/07/2022) 1 each 3   enoxaparin (LOVENOX) 40 MG/0.4ML injection Inject 0.4 mLs (40 mg total) into the skin every 12 (twelve) hours. (Patient not taking: Reported on 10/07/2022) 4 mL 0   potassium chloride SA (KLOR-CON M) 20 MEQ tablet Take 1 tablet (20 mEq total) by mouth daily. (Patient not taking: Reported on 10/07/2022) 30 tablet 6   No current facility-administered medications for this encounter.    Mushroom extract complex and Neosporin [neomycin-bacitracin zn-polymyx]  REVIEW OF SYSTEMS: All systems negative except as listed in HPI, PMH and Problem list.   LVAD INTERROGATION:  See LVAD nurse's note above.   I reviewed the LVAD parameters from today, and compared the results to the patient's prior recorded data.  No programming changes were made.  The LVAD is functioning within specified parameters.  The patient performs LVAD self-test daily.  LVAD interrogation was negative for any significant power changes, alarms or PI events/speed drops.  LVAD equipment check completed and is in good working order.  Back-up equipment present.   LVAD education done on emergency procedures and precautions and reviewed exit site care.    Vitals:   10/07/22 1133 10/07/22 1134  BP: (!) 83/67 (!) 86/0  Pulse: 74   SpO2: 100%   Weight: 85.2 kg (187 lb 12.8 oz)   Height: 5\' 9"  (1.753 m)   MAP 74  Physical Exam: General: Well appearing this am. NAD.  HEENT: Normal. Neck: Supple, JVP 8-9 cm. Carotids OK.  Cardiac:  Mechanical heart sounds with LVAD hum present.   Lungs:  CTAB, normal effort.  Abdomen:  NT, ND, no HSM. No bruits or masses. +BS  LVAD exit site: Well-healed and incorporated. Dressing dry and intact. No erythema or drainage. Stabilization device present and accurately applied. Driveline dressing changed daily per sterile technique. Extremities:  Warm and dry. No cyanosis, clubbing, rash, or edema.  Neuro:  Alert &  oriented x 3. Cranial nerves grossly intact. Moves all 4 extremities w/o difficulty. Affect pleasant    ASSESSMENT AND PLAN: 1. Chronic systolic CHF:  Long-standing cardiomyopathy.  [place].  Echo in 12/22 with EF < 20%, severe LV dilation, restrictive diastolic function, moderate RV dysfunction, moderate MR, mod-severe TR. Cause of cardiomyopathy is uncertain.  He has a history of CAD, but I do not think that the described CAD from the past could explain his cardiomyopathy, but CAD could have progressed.  He was admitted in 12/22 with cardiogenic shock and Impella 5.5. s/p HM III VAD on 09/17/22.  Post-op course complicated by AKI and RV failure. He was on HD for several months but is now off.  NYHA class II with mild volume overload on exam. .   - Continue Lasix 60 mg daily. BMET today.  - Continue sildenafil 20 tid for RV.   - Warfarin for INR 2-2.5.   2. Tricuspid regurgitation:  Tricuspid repair not done at time of VAD due to proximity of ICD wires and hypotension during surgery.  He has severe TR.  3. Atrial fibrillation: Regular rhythm. - Continue amiodarone 200 mg daily, check LFTs and TSH.  He will need a regular eye exam. 4. CKD stage IV: Suspect intra-op hypotension at the time of LVAD placement led to development of ATN => urine sediment looked like ATN per renal. Had initial CVVH and transitioned over to HD. He is now off HD with creatinine stable, most recently 2.95.   - BMET today.  5. CAD: History of PCI to OM1 in 2007 and RCA in 2013.  No CP or ACs.  - Deferred cardiac cath in 12/22 due to AKI and plan  for VAD - Continue Crestor.    Followup in 2 months.   Loralie Champagne 10/07/2022

## 2022-10-07 NOTE — Progress Notes (Signed)
Can send to another endocrine practice if we can get in sooner.

## 2022-10-08 LAB — T3, FREE: T3, Free: 2.6 pg/mL (ref 2.0–4.4)

## 2022-10-19 ENCOUNTER — Telehealth (HOSPITAL_COMMUNITY): Payer: Self-pay | Admitting: Pharmacist

## 2022-10-19 NOTE — Telephone Encounter (Signed)
Patient did not send in INR from home machine last week. Attempted to contact patient to ask him to check his INR. Unable to reach patient and VM box is full. Will inform LVAD coordinators so they are aware.   Audry Riles, PharmD, BCPS, BCCP, CPP Heart Failure Clinic Pharmacist [phone]

## 2022-10-22 ENCOUNTER — Ambulatory Visit (HOSPITAL_COMMUNITY): Payer: Self-pay | Admitting: Pharmacist

## 2022-10-22 LAB — POCT INR: INR: 4 — AB (ref 2.0–3.0)

## 2022-10-27 ENCOUNTER — Ambulatory Visit: Payer: Medicare HMO | Attending: Internal Medicine | Admitting: Internal Medicine

## 2022-10-27 ENCOUNTER — Encounter: Payer: Self-pay | Admitting: Internal Medicine

## 2022-10-27 VITALS — HR 68 | Ht 69.0 in | Wt 191.0 lb

## 2022-10-27 DIAGNOSIS — Z95811 Presence of heart assist device: Secondary | ICD-10-CM | POA: Diagnosis not present

## 2022-10-27 DIAGNOSIS — Z95810 Presence of automatic (implantable) cardiac defibrillator: Secondary | ICD-10-CM | POA: Insufficient documentation

## 2022-10-27 DIAGNOSIS — I472 Ventricular tachycardia, unspecified: Secondary | ICD-10-CM | POA: Insufficient documentation

## 2022-10-27 DIAGNOSIS — I428 Other cardiomyopathies: Secondary | ICD-10-CM

## 2022-10-27 DIAGNOSIS — I4891 Unspecified atrial fibrillation: Secondary | ICD-10-CM

## 2022-10-27 DIAGNOSIS — Z79899 Other long term (current) drug therapy: Secondary | ICD-10-CM

## 2022-10-27 NOTE — Progress Notes (Signed)
Patient Care Team: [name], MD as PCP - General (Family Medicine)   HPI  Isaac [name] is a 72 y.o. male seen in follow-up for an [place] implanted 2014 for nonischemic cardiomyopathy.  Recurrent ventricular arrhythmias with syncope.  Shock.  Impella and now LVAD.  This complicated by severe TR.  On amiodarone for ventricular tachycardia.  Tolerating.  Transiently on dialysis but no longer.  Modest dyspnea but tolerating ADLs quite well.  No edema.  Some lightheadedness.   DATE TEST EF    12/22 Echo   <20 %    1/23 Echo  25% TR severe    Date Cr K Hgb  2/23 2.21 3.0 8.8  1/24 2.8 4.3 11.7       Records and Results Reviewed   Past Medical History:  Diagnosis Date   Arrhythmia    atrial fibrillation   CHF (congestive heart failure) ([place])    Chronic kidney disease    Coronary artery disease    Hyperlipidemia    Hypertension    Myocardial infarct [facility])     Past Surgical History:  Procedure Laterality Date   CARDIAC DEFIBRILLATOR PLACEMENT  feb 2014   INSERTION OF IMPLANTABLE LEFT VENTRICULAR ASSIST DEVICE N/A 09/17/2021   Procedure: INSERTION OF IMPLANTABLE LEFT VENTRICULAR ASSIST DEVICE AND INSERTION OF FEMORAL ARTERIAL LINE;  Surgeon: Gaye Pollack, MD;  Location: [facility];  Service: Open Heart Surgery;  Laterality: N/A;   IR FLUORO GUIDE CV LINE RIGHT  09/27/2021   IR THORACENTESIS ASP PLEURAL SPACE W/IMG GUIDE  09/27/2021   IR US GUIDE VASC ACCESS RIGHT  09/27/2021   PLACEMENT OF IMPELLA LEFT VENTRICULAR ASSIST DEVICE N/A 09/12/2021   Procedure: PLACEMENT OF IMPELLA 5.5 LEFT VENTRICULAR ASSIST DEVICE;  Surgeon: Melrose Nakayama, MD;  Location: [facility];  Service: Open Heart Surgery;  Laterality: N/A;   RIGHT HEART CATH N/A 09/08/2021   Procedure: RIGHT HEART CATH;  Surgeon: Larey Dresser, MD;  Location: [facility];  Service: Cardiovascular;  Laterality: N/A;   TEE WITHOUT CARDIOVERSION N/A 09/12/2021   Procedure:  TRANSESOPHAGEAL ECHOCARDIOGRAM (TEE);  Surgeon: Melrose Nakayama, MD;  Location: [facility];  Service: Open Heart Surgery;  Laterality: N/A;   TEE WITHOUT CARDIOVERSION N/A 09/17/2021   Procedure: TRANSESOPHAGEAL ECHOCARDIOGRAM (TEE);  Surgeon: Gaye Pollack, MD;  Location: [facility];  Service: Open Heart Surgery;  Laterality: N/A;    Current Meds  Medication Sig   albuterol (VENTOLIN HFA) 108 (90 Base) MCG/ACT inhaler Inhale 2 puffs into the lungs every 6 (six) hours as needed.   amiodarone (PACERONE) 200 MG tablet Take 1 tablet (200 mg total) by mouth daily. Take 200 mg twice a day x 7 days, then resume a dose of 200 mg daily.   enoxaparin (LOVENOX) 40 MG/0.4ML injection Inject 0.4 mLs (40 mg total) into the skin every 12 (twelve) hours.   furosemide (LASIX) 40 MG tablet Take 1.5 tablets (60 mg total) by mouth daily.   pantoprazole (PROTONIX) 40 MG tablet TAKE 1 TABLET BY MOUTH EVERY DAY   potassium chloride SA (KLOR-CON M) 20 MEQ tablet Take 1 tablet (20 mEq total) by mouth daily.   rosuvastatin (CRESTOR) 10 MG tablet TAKE 1 TABLET BY MOUTH EVERY DAY   sildenafil (REVATIO) 20 MG tablet TAKE 1 TABLET BY MOUTH THREE TIMES DAILY   warfarin (COUMADIN) 2 MG tablet Take 4 mg (2 tabs) every Monday/Wednesday/Friday and 2 mg (1 tab) all  other days or as directed by the [facility].    Allergies  Allergen Reactions   Mushroom Extract Complex Nausea And Vomiting   Neosporin [Neomycin-Bacitracin Zn-Polymyx] Hives      Review of Systems negative except from HPI and PMH  Physical Exam Pulse 68   Ht 5\' 9"  (1.753 m)   Wt 191 lb (86.6 kg)   SpO2 98%   BMI 28.21 kg/m  Well developed and well nourished in no acute distress HENT normal E scleral and icterus clear Neck Supple JVP flat; carotids brisk and full Clear to ausculation Regular rate and rhythm, no murmurs gallops or rub Soft with active bowel sounds No clubbing cyanosis  Edema Alert and oriented, grossly normal motor and  sensory function Skin Warm and Dry  ECG   Estimated Creatinine Clearance: 26 mL/min (A) (by C-G formula based on SCr of 2.88 mg/dL (H)).   Assessment and  Plan NICM    CAD with prior Stent   LVAD  Ventricular Tachycardia with ATP    ICD [employer]   Atrial lead failure      No interval ventricular tachycardia.  Continue the amiodarone.   His device is approaching ERI.  Reviewed the controversy regarding ICD and LVAD patients.  Will have to have discussions with heart failure regarding device generator replacement.        Current medicines are reviewed at length with the patient today .  The patient does not  have concerns regarding medicines.

## 2022-10-27 NOTE — Patient Instructions (Signed)
Medication Instructions:  Your physician recommends that you continue on your current medications as directed. Please refer to the Current Medication list given to you today.  *If you need a refill on your cardiac medications before your next appointment, please call your pharmacy*   Lab Work: None ordered.  If you have labs (blood work) drawn today and your tests are completely normal, you will receive your results only by: MyChart Message (if you have MyChart) OR A paper copy in the mail If you have any lab test that is abnormal or we need to change your treatment, we will call you to review the results.   Testing/Procedures: None ordered.    Follow-Up: At [facility], you and your health needs are our priority.  As part of our continuing mission to provide you with exceptional heart care, we have created designated Provider Care Teams.  These Care Teams include your primary Cardiologist (physician) and Advanced Practice Providers (APPs -  Physician Assistants and Nurse Practitioners) who all work together to provide you with the care you need, when you need it.  We recommend signing up for the patient portal called "MyChart".  Sign up information is provided on this After Visit Summary.  MyChart is used to connect with patients for Virtual Visits (Telemedicine).  Patients are able to view lab/test results, encounter notes, upcoming appointments, etc.  Non-urgent messages can be sent to your provider as well.   To learn more about what you can do with MyChart, go to [email or web address].    Your next appointment:   6 months with Dr Klein 

## 2022-10-29 ENCOUNTER — Ambulatory Visit (HOSPITAL_COMMUNITY): Payer: Self-pay | Admitting: Pharmacist

## 2022-10-29 LAB — POCT INR: INR: 2.4 (ref 2.0–3.0)

## 2022-11-05 ENCOUNTER — Ambulatory Visit (INDEPENDENT_AMBULATORY_CARE_PROVIDER_SITE_OTHER): Payer: Medicare HMO

## 2022-11-05 DIAGNOSIS — I428 Other cardiomyopathies: Secondary | ICD-10-CM

## 2022-11-05 LAB — CUP PACEART REMOTE DEVICE CHECK
Battery Remaining Longevity: 6 mo
Battery Remaining Percentage: 6 %
Brady Statistic RA Percent Paced: 0 %
Brady Statistic RV Percent Paced: 9 %
Date Time Interrogation Session: 20240214040300
HighPow Impedance: 51 Ohm
Implantable Lead Connection Status: 753985
Implantable Lead Connection Status: 753985
Implantable Lead Implant Date: 20140205
Implantable Lead Implant Date: 20140205
Implantable Lead Location: 753859
Implantable Lead Location: 753860
Implantable Lead Model: 296
Implantable Lead Model: 4470
Implantable Lead Serial Number: 727876
Implantable Pulse Generator Implant Date: 20140205
Lead Channel Impedance Value: 351 Ohm
Lead Channel Impedance Value: 416 Ohm
Lead Channel Pacing Threshold Amplitude: 1.5 V
Lead Channel Pacing Threshold Amplitude: 3.4 V
Lead Channel Pacing Threshold Pulse Width: 0.5 ms
Lead Channel Pacing Threshold Pulse Width: 2 ms
Lead Channel Setting Pacing Amplitude: 3.5 V
Lead Channel Setting Pacing Pulse Width: 0.5 ms
Lead Channel Setting Sensing Sensitivity: 0.6 mV
Pulse Gen Serial Number: 113490
Zone Setting Status: 755011

## 2022-11-06 ENCOUNTER — Ambulatory Visit (HOSPITAL_COMMUNITY): Payer: Self-pay | Admitting: Pharmacist

## 2022-11-06 LAB — POCT INR: INR: 2.6 (ref 2.0–3.0)

## 2022-11-13 ENCOUNTER — Ambulatory Visit (HOSPITAL_COMMUNITY): Payer: Self-pay | Admitting: Pharmacist

## 2022-11-13 LAB — POCT INR: INR: 3.3 — AB (ref 2.0–3.0)

## 2022-11-20 ENCOUNTER — Ambulatory Visit (HOSPITAL_COMMUNITY): Payer: Self-pay | Admitting: Pharmacist

## 2022-11-20 LAB — POCT INR: INR: 2.4 (ref 2.0–3.0)

## 2022-11-23 ENCOUNTER — Telehealth (HOSPITAL_COMMUNITY): Payer: Self-pay | Admitting: Unknown Physician Specialty

## 2022-11-23 NOTE — Telephone Encounter (Signed)
Pt called the VAD office with c/o a rash in his groin. Pt states that it feels like a "knot" and is "painful." Pt tells me that he has had a hernia before and this is different.  Pt is also c/o of not being able to raise his left arm above his shoulder. He states that trying to raise his arm is very painful.  D/w above with Dr Aundra Dubin . Pt was instructed to called his PCP for both of these issues. Pt verbalized understanding.  [name] RN, BSN VAD Coordinator 24/7 Pager [phone]

## 2022-11-24 ENCOUNTER — Other Ambulatory Visit: Payer: Self-pay | Admitting: General Surgery

## 2022-11-24 ENCOUNTER — Ambulatory Visit
Admission: RE | Admit: 2022-11-24 | Discharge: 2022-11-24 | Disposition: A | Discharge status: Home / Self Care | Payer: Medicare HMO | Source: Ambulatory Visit | Attending: General Surgery | Admitting: General Surgery

## 2022-11-24 DIAGNOSIS — L02214 Cutaneous abscess of groin: Secondary | ICD-10-CM

## 2022-11-25 ENCOUNTER — Ambulatory Visit
Admission: RE | Admit: 2022-11-25 | Discharge: 2022-11-25 | Disposition: A | Discharge status: Home / Self Care | Payer: Medicare HMO | Source: Ambulatory Visit | Attending: General Surgery | Admitting: General Surgery

## 2022-11-25 ENCOUNTER — Encounter (HOSPITAL_COMMUNITY): Payer: Self-pay

## 2022-11-25 ENCOUNTER — Other Ambulatory Visit: Payer: Self-pay | Admitting: General Surgery

## 2022-11-25 DIAGNOSIS — L02214 Cutaneous abscess of groin: Secondary | ICD-10-CM

## 2022-11-25 NOTE — Progress Notes (Signed)
VAD Coordinator spoke with pt and wife in regards to painful knot of left groin. Pt referred to General Surgery. Spoke with Windell Moment MD with General Surgery in regards to plan. Ultrasound completed yesterday cannot rule out abscess vs pseudoaneurysm. Vascular ultrasound to be completed this afternoon. Once imaging complete will determine plan. MD made aware if patient needs more than local anesthesia for removal he will have to be sent to [facility] for treatment due to his VAD. Will continue to follow to assist with coordination of care as needed. Dr. Aundra Dubin made aware of current plan.   Bobbye Morton RN, BSN VAD Coordinator 24/7 Pager [phone]

## 2022-11-25 NOTE — Addendum Note (Signed)
Addended by: Douglass Rivers D on: 11/25/2022 01:31 PM   Modules accepted: Level of Service

## 2022-11-25 NOTE — Progress Notes (Signed)
Remote ICD transmission.   

## 2022-11-26 LAB — POCT INR: INR: 3.1 — AB (ref 2.0–3.0)

## 2022-11-27 ENCOUNTER — Inpatient Hospital Stay (HOSPITAL_COMMUNITY)
Admission: AD | Admit: 2022-11-27 | Discharge: 2022-12-04 | DRG: 603 | Disposition: A | Discharge status: Home / Self Care | Payer: Medicare HMO | Attending: Cardiology | Admitting: Cardiology

## 2022-11-27 ENCOUNTER — Encounter (HOSPITAL_COMMUNITY): Payer: Self-pay

## 2022-11-27 ENCOUNTER — Other Ambulatory Visit: Payer: Self-pay | Admitting: Cardiology

## 2022-11-27 ENCOUNTER — Ambulatory Visit (HOSPITAL_COMMUNITY): Payer: Self-pay

## 2022-11-27 DIAGNOSIS — Z95810 Presence of automatic (implantable) cardiac defibrillator: Secondary | ICD-10-CM

## 2022-11-27 DIAGNOSIS — I4891 Unspecified atrial fibrillation: Secondary | ICD-10-CM

## 2022-11-27 DIAGNOSIS — Z9861 Coronary angioplasty status: Secondary | ICD-10-CM | POA: Diagnosis not present

## 2022-11-27 DIAGNOSIS — Z952 Presence of prosthetic heart valve: Secondary | ICD-10-CM | POA: Diagnosis not present

## 2022-11-27 DIAGNOSIS — L02214 Cutaneous abscess of groin: Secondary | ICD-10-CM | POA: Diagnosis present

## 2022-11-27 DIAGNOSIS — K4090 Unilateral inguinal hernia, without obstruction or gangrene, not specified as recurrent: Secondary | ICD-10-CM | POA: Diagnosis present

## 2022-11-27 DIAGNOSIS — I429 Cardiomyopathy, unspecified: Secondary | ICD-10-CM | POA: Diagnosis present

## 2022-11-27 DIAGNOSIS — I2510 Atherosclerotic heart disease of native coronary artery without angina pectoris: Secondary | ICD-10-CM | POA: Diagnosis present

## 2022-11-27 DIAGNOSIS — Z95811 Presence of heart assist device: Secondary | ICD-10-CM | POA: Diagnosis not present

## 2022-11-27 DIAGNOSIS — N492 Inflammatory disorders of scrotum: Secondary | ICD-10-CM | POA: Diagnosis present

## 2022-11-27 DIAGNOSIS — I493 Ventricular premature depolarization: Secondary | ICD-10-CM | POA: Diagnosis present

## 2022-11-27 DIAGNOSIS — Z79899 Other long term (current) drug therapy: Secondary | ICD-10-CM | POA: Diagnosis not present

## 2022-11-27 DIAGNOSIS — I071 Rheumatic tricuspid insufficiency: Secondary | ICD-10-CM | POA: Diagnosis present

## 2022-11-27 DIAGNOSIS — Z885 Allergy status to narcotic agent status: Secondary | ICD-10-CM | POA: Diagnosis not present

## 2022-11-27 DIAGNOSIS — Z7901 Long term (current) use of anticoagulants: Secondary | ICD-10-CM | POA: Diagnosis not present

## 2022-11-27 DIAGNOSIS — I5022 Chronic systolic (congestive) heart failure: Secondary | ICD-10-CM | POA: Diagnosis present

## 2022-11-27 DIAGNOSIS — E785 Hyperlipidemia, unspecified: Secondary | ICD-10-CM | POA: Diagnosis present

## 2022-11-27 DIAGNOSIS — L03314 Cellulitis of groin: Secondary | ICD-10-CM | POA: Diagnosis present

## 2022-11-27 DIAGNOSIS — I130 Hypertensive heart and chronic kidney disease with heart failure and stage 1 through stage 4 chronic kidney disease, or unspecified chronic kidney disease: Secondary | ICD-10-CM | POA: Diagnosis present

## 2022-11-27 DIAGNOSIS — N433 Hydrocele, unspecified: Secondary | ICD-10-CM | POA: Diagnosis present

## 2022-11-27 DIAGNOSIS — N184 Chronic kidney disease, stage 4 (severe): Secondary | ICD-10-CM | POA: Diagnosis present

## 2022-11-27 DIAGNOSIS — Z888 Allergy status to other drugs, medicaments and biological substances status: Secondary | ICD-10-CM

## 2022-11-27 DIAGNOSIS — I252 Old myocardial infarction: Secondary | ICD-10-CM

## 2022-11-27 LAB — CBC WITH DIFFERENTIAL/PLATELET
Abs Immature Granulocytes: 0.02 10*3/uL (ref 0.00–0.07)
Basophils Absolute: 0.1 10*3/uL (ref 0.0–0.1)
Basophils Relative: 1 %
Eosinophils Absolute: 0.7 10*3/uL — ABNORMAL HIGH (ref 0.0–0.5)
Eosinophils Relative: 14 %
HCT: 37.7 % — ABNORMAL LOW (ref 39.0–52.0)
Hemoglobin: 12 g/dL — ABNORMAL LOW (ref 13.0–17.0)
Immature Granulocytes: 0 %
Lymphocytes Relative: 16 %
Lymphs Abs: 0.8 10*3/uL (ref 0.7–4.0)
MCH: 29.6 pg (ref 26.0–34.0)
MCHC: 31.8 g/dL (ref 30.0–36.0)
MCV: 93.1 fL (ref 80.0–100.0)
Monocytes Absolute: 0.6 10*3/uL (ref 0.1–1.0)
Monocytes Relative: 13 %
Neutro Abs: 2.6 10*3/uL (ref 1.7–7.7)
Neutrophils Relative %: 56 %
Platelets: 199 10*3/uL (ref 150–400)
RBC: 4.05 MIL/uL — ABNORMAL LOW (ref 4.22–5.81)
RDW: 15.8 % — ABNORMAL HIGH (ref 11.5–15.5)
WBC: 4.7 10*3/uL (ref 4.0–10.5)
nRBC: 0 % (ref 0.0–0.2)

## 2022-11-27 LAB — PROTIME-INR
INR: 2.8 — ABNORMAL HIGH (ref 0.8–1.2)
Prothrombin Time: 29.6 seconds — ABNORMAL HIGH (ref 11.4–15.2)

## 2022-11-27 LAB — TYPE AND SCREEN
ABO/RH(D): O POS
Antibody Screen: NEGATIVE

## 2022-11-27 LAB — MRSA NEXT GEN BY PCR, NASAL: MRSA by PCR Next Gen: NOT DETECTED

## 2022-11-27 MED ORDER — PANTOPRAZOLE SODIUM 40 MG PO TBEC
40.0000 mg | DELAYED_RELEASE_TABLET | Freq: Every day | ORAL | Status: DC
  Administered 2022-11-28 – 2022-12-04 (×3): 40 mg via ORAL
  Filled 2022-11-27 (×7): qty 1

## 2022-11-27 MED ORDER — ALBUTEROL SULFATE (2.5 MG/3ML) 0.083% IN NEBU: 2.5000 mg | INHALATION_SOLUTION | Freq: Four times a day (QID) | RESPIRATORY_TRACT | Status: DC | PRN

## 2022-11-27 MED ORDER — SILDENAFIL CITRATE 20 MG PO TABS
20.0000 mg | ORAL_TABLET | Freq: Three times a day (TID) | ORAL | Status: DC
  Administered 2022-11-27 – 2022-12-04 (×20): 20 mg via ORAL
  Filled 2022-11-27 (×20): qty 1

## 2022-11-27 MED ORDER — ROSUVASTATIN CALCIUM 20 MG PO TABS
10.0000 mg | ORAL_TABLET | Freq: Every day | ORAL | Status: DC
  Filled 2022-11-27 (×2): qty 1

## 2022-11-27 MED ORDER — AMIODARONE HCL 200 MG PO TABS
200.0000 mg | ORAL_TABLET | Freq: Every day | ORAL | Status: DC
  Administered 2022-11-28 – 2022-12-04 (×7): 200 mg via ORAL
  Filled 2022-11-27 (×7): qty 1

## 2022-11-27 MED ORDER — ACETAMINOPHEN 325 MG PO TABS
650.0000 mg | ORAL_TABLET | ORAL | Status: DC | PRN
  Administered 2022-11-29: 650 mg via ORAL
  Filled 2022-11-27: qty 2

## 2022-11-27 NOTE — H&P (Signed)
Advanced Heart Failure VAD History and Physical Note   PCP-Cardiologist: Dr. Aundra Dubin   Reason for Admission: Left Groin Abscess   HPI:    72 y.o. with history of CAD, HTN, atrial fibrillation, and chronic systolic CHF s/p HMIII LVAD.    Patient has had a long-standing cardiomyopathy, EF had been in the 25% range for years.  He had PCI to OM1 in 2007 and RCA in 2013.  From his report, these episodes do not sound like ACS events.  Echo in 12/22 showed EF < 20%, severe LV dilation, restrictive diastolic function, moderate RV dysfunction, moderate MR, mod-severe TR. HF was been complicated by CKD stage 3. He was also noted on device interrogation to have been in atrial fibrillation persistently since 10/22.  He was admitted at [facility] in 12/22.  BP was low, his BP-active meds were stopped and midodrine was begun.  He was diuresed and discharged home.     He reported ongoing severe dyspnea and was admitted again in 12/22 after he stood up then passed out at home.  Device interrogation showed run of VT treated x 2 with ATP possibly around the time of syncope.  RHC in 12/22 showed low output HF and he developed cardiogenic shock. Impella 5.5 was placed.  On 09/17/21, HM3 LVAD was placed, ICD leads were plastered to the tricuspid valve with severe TR, the valve was not replaced.  The patient had significant interoperative hypotension.  Post-operatively, he developed progressive renal failure and was started on CVVH then iHD.  He had a bumpy post-op course with renal failure and RV failure, but was ultimately able to discharge home on 10/24/21.    Ramp echo in 3/23, RV mildly dilated with normal systolic function, severe TR, LV EF 20-25%, aortic valve opens every beat.  Speed increased to 5300 rpm. The IV septum was mildly leftwards at 5300 rpm but flow increased and AoV still opened every beat.   He is now off HD with creatinine stable, most recently 2.8.    Pt contacted VAD clinic on 3/4 w/ complaints of  left groin pain and swelling. Sent for imaging and found to have an abscess. He is being direct admitted for further management and initiation of IV abx. According to Mr. Costello, pain in his left groin has been progressing over the past 2 weeks now; he now reports it as a 7/10. No fevers, chills, diarrhea, nausea or systemic signs of illness.   LVAD INTERROGATION:  HeartMate II LVAD:  Flow 4.3 liters/min, speed 5300, power 4.2, PI 3.9.       Home Medications Prior to Admission medications   Medication Sig Start Date End Date Taking? Authorizing Provider  albuterol (VENTOLIN HFA) 108 (90 Base) MCG/ACT inhaler Inhale 2 puffs into the lungs every 6 (six) hours as needed. 07/03/22 10/27/22  Larey Dresser, MD  amiodarone (PACERONE) 200 MG tablet Take 1 tablet (200 mg total) by mouth daily. Take 200 mg twice a day x 7 days, then resume a dose of 200 mg daily. 09/02/22   Larey Dresser, MD  enoxaparin (LOVENOX) 40 MG/0.4ML injection Inject 0.4 mLs (40 mg total) into the skin every 12 (twelve) hours. 08/06/22   Larey Dresser, MD  furosemide (LASIX) 40 MG tablet Take 1.5 tablets (60 mg total) by mouth daily. 07/29/22   Larey Dresser, MD  pantoprazole (PROTONIX) 40 MG tablet TAKE 1 TABLET BY MOUTH EVERY DAY 11/27/22   Deboraha Sprang, MD  potassium chloride SA (KLOR-CON  M) 20 MEQ tablet Take 1 tablet (20 mEq total) by mouth daily. 07/30/22   Larey Dresser, MD  rosuvastatin (CRESTOR) 10 MG tablet TAKE 1 TABLET BY MOUTH EVERY DAY 11/27/22   Deboraha Sprang, MD  sildenafil (REVATIO) 20 MG tablet TAKE 1 TABLET BY MOUTH THREE TIMES DAILY 09/02/22   Lyda Jester M, PA-C  warfarin (COUMADIN) 2 MG tablet Take 4 mg (2 tabs) every Monday/Wednesday/Friday and 2 mg (1 tab) all other days or as directed by the Advanced HF Clinic. 04/02/22   Larey Dresser, MD    Past Medical History: Past Medical History:  Diagnosis Date   Arrhythmia    atrial fibrillation   CHF (congestive heart failure) ([place])     Chronic kidney disease    Coronary artery disease    Hyperlipidemia    Hypertension    Myocardial infarct [facility])     Past Surgical History: Past Surgical History:  Procedure Laterality Date   CARDIAC DEFIBRILLATOR PLACEMENT  feb 2014   INSERTION OF IMPLANTABLE LEFT VENTRICULAR ASSIST DEVICE N/A 09/17/2021   Procedure: INSERTION OF IMPLANTABLE LEFT VENTRICULAR ASSIST DEVICE AND INSERTION OF FEMORAL ARTERIAL LINE;  Surgeon: Gaye Pollack, MD;  Location: [place];  Service: Open Heart Surgery;  Laterality: N/A;   IR FLUORO GUIDE CV LINE RIGHT  09/27/2021   IR THORACENTESIS ASP PLEURAL SPACE W/IMG GUIDE  09/27/2021   IR US GUIDE VASC ACCESS RIGHT  09/27/2021   PLACEMENT OF IMPELLA LEFT VENTRICULAR ASSIST DEVICE N/A 09/12/2021   Procedure: PLACEMENT OF IMPELLA 5.5 LEFT VENTRICULAR ASSIST DEVICE;  Surgeon: Melrose Nakayama, MD;  Location: [place];  Service: Open Heart Surgery;  Laterality: N/A;   RIGHT HEART CATH N/A 09/08/2021   Procedure: RIGHT HEART CATH;  Surgeon: Larey Dresser, MD;  Location: [place] CV LAB;  Service: Cardiovascular;  Laterality: N/A;   TEE WITHOUT CARDIOVERSION N/A 09/12/2021   Procedure: TRANSESOPHAGEAL ECHOCARDIOGRAM (TEE);  Surgeon: Melrose Nakayama, MD;  Location: [place];  Service: Open Heart Surgery;  Laterality: N/A;   TEE WITHOUT CARDIOVERSION N/A 09/17/2021   Procedure: TRANSESOPHAGEAL ECHOCARDIOGRAM (TEE);  Surgeon: Gaye Pollack, MD;  Location: [place];  Service: Open Heart Surgery;  Laterality: N/A;    Family History: No family history on file.  Social History: Social History   Socioeconomic History   Marital status: Married    Spouse name: Not on file   Number of children: Not on file   Years of education: Not on file   Highest education level: Not on file  Occupational History   Not on file  Tobacco Use   Smoking status: Former   Smokeless tobacco: Never  Substance and Sexual Activity   Alcohol use: No   Drug use: No   Sexual activity:  Not on file  Other Topics Concern   Not on file  Social History Narrative   Not on file   Social Determinants of Health   Financial Resource Strain: Not on file  Food Insecurity: Not on file  Transportation Needs: Not on file  Physical Activity: Not on file  Stress: Not on file  Social Connections: Not on file    Allergies:  Allergies  Allergen Reactions   Mushroom Extract Complex Nausea And Vomiting   Neosporin [Neomycin-Bacitracin Zn-Polymyx] Hives    Objective:    Vital Signs:   109/98  Physical Exam    General:  Well appearing. No resp difficulty HEENT: Normal Neck: supple. JVP . Carotids 2+ bilat;  no bruits. No lymphadenopathy or thyromegaly appreciated. Cor: Mechanical heart sounds with LVAD hum present. Lungs: Clear Abdomen: soft, nontender, nondistended. No hepatosplenomegaly. No bruits or masses. Good bowel sounds. Driveline: C/D/I; securement device intact and driveline incorporated Extremities: no cyanosis, clubbing, rash, edema left groin swollen and extremely tender to palpation Neuro: alert & orientedx3, cranial nerves grossly intact. moves all 4 extremities w/o difficulty. Affect pleasant   Telemetry   afib  EKG   pending  Labs    Basic Metabolic Panel: No results for input(s): "NA", "K", "CL", "CO2", "GLUCOSE", "BUN", "CREATININE", "CALCIUM", "MG", "PHOS" in the last 168 hours.  Liver Function Tests: No results for input(s): "AST", "ALT", "ALKPHOS", "BILITOT", "PROT", "ALBUMIN" in the last 168 hours. No results for input(s): "LIPASE", "AMYLASE" in the last 168 hours. No results for input(s): "AMMONIA" in the last 168 hours.  CBC: No results for input(s): "WBC", "NEUTROABS", "HGB", "HCT", "MCV", "PLT" in the last 168 hours.  Cardiac Enzymes: No results for input(s): "CKTOTAL", "CKMB", "CKMBINDEX", "TROPONINI" in the last 168 hours.  BNP: BNP (last 3 results) Recent Labs    07/02/22 1642  BNP 501.5*    ProBNP (last 3 results) No  results for input(s): "PROBNP" in the last 8760 hours.   CBG: No results for input(s): "GLUCAP" in the last 168 hours.  Coagulation Studies: Recent Labs    11/26/22 0000  INR 3.1*    Other results: pending  Imaging    No results found.    Patient Profile:   72 y.o. with history of CAD, HTN, atrial fibrillation, chronic systolic CHF s/p HMIII LVAD and CKD IV, direct admitted for treatment of left groin abscess.   Assessment/Plan:    1. Left Groin Abscess  - obtain CT of A/P  - obtain Blood Cx, followed by broad spectrum abx - consult IR for drainage. If not amendable to IR management may need gen surgery   2. Chronic systolic CHF:  Long-standing cardiomyopathy.  [place].  Echo in 12/22 with EF < 20%, severe LV dilation, restrictive diastolic function, moderate RV dysfunction, moderate MR, mod-severe TR. Cause of cardiomyopathy is uncertain.  He has a history of CAD, but I do not think that the described CAD from the past could explain his cardiomyopathy, but CAD could have progressed.  He was admitted in 12/22 with cardiogenic shock and Impella 5.5. s/p HM III VAD on 09/17/22.  Post-op course complicated by AKI and RV failure. He was on HD for several months but is now off.   - Continue Lasix 60 mg daily.  - Continue sildenafil 20 tid for RV.   - On Warfarin. INR goal 2-2.5.  Hold coumadin for anticipated procedures. Check INR. Start heparin gtt once INR < 2.0  - follow LDH   3. Tricuspid regurgitation:  Tricuspid repair not done at time of VAD due to proximity of ICD wires and hypotension during surgery.  He has severe TR.   4. Atrial fibrillation:  - Continue amiodarone 200 mg daily - Hold Coumadin per above, cover w/ heparin gtt   5. CKD stage IV: Suspect intra-op hypotension at the time of LVAD placement led to development of ATN => urine sediment looked like ATN per renal. Had initial CVVH and transitioned over to HD. He is now off HD with creatinine  stable, most recently 2.8.   - check BMP   6. CAD: History of PCI to OM1 in 2007 and RCA in 2013.  No CP or ACs.  -  Deferred cardiac cath in 12/22 due to AKI and plan for VAD - Continue Crestor.      I reviewed the LVAD parameters from today, and compared the results to the patient's prior recorded data.  No programming changes were made.  The LVAD is functioning within specified parameters.  The patient performs LVAD self-test daily.  LVAD interrogation was negative for any significant power changes, alarms or PI events/speed drops.  LVAD equipment check completed and is in good working order.  Back-up equipment present.   LVAD education done on emergency procedures and precautions and reviewed exit site care.  Loden Laurent Advanced Heart Failure   Advanced Heart Failure Team Pager [phone] (M-F; 7a - 5p)  Please contact [facility] for night-coverage after hours (5p -7a ) and weekends on [email or web address] for all non- LVAD Issues

## 2022-11-27 NOTE — Progress Notes (Signed)
LVAD INR 

## 2022-11-27 NOTE — Progress Notes (Incomplete)
ANTICOAGULATION CONSULT NOTE - Initial Consult  Pharmacy Consult for warfarin Indication: LVAD  Allergies  Allergen Reactions   Mushroom Extract Complex Nausea And Vomiting   Doxycycline Nausea And Vomiting   Neosporin [Neomycin-Bacitracin Zn-Polymyx] Hives   Tape Other (See Comments)    Some tapes/dressings can irritate the skin    Patient Measurements:     Vital Signs: Temp: 97.7 F (36.5 C) (03/08 2044) Temp Source: Oral (03/08 2044) BP: 109/98 (03/08 2044) Pulse Rate: 86 (03/08 2044)  Labs: Recent Labs    11/26/22 0000  INR 3.1*    CrCl cannot be calculated (Patient's most recent lab result is older than the maximum 21 days allowed.).   Medical History: Past Medical History:  Diagnosis Date   Arrhythmia    atrial fibrillation   CHF (congestive heart failure) (HCC)    Chronic kidney disease    Coronary artery disease    Hyperlipidemia    Hypertension    Myocardial infarct [facility])      Assessment: 72 yo M on warfarin for LVAD placed 09/17/21 with goal 2- 2.5 since placement. Patient has INR machine at home, checks every Thursday, did not take any warfarin 3/7 due to INR 3.1 being above goal. Admitted for groin abscess, was taking doxycycline PTA. Planning to hold warfarin for possible procedure. Pharmacy consulted to start heparin when INR < 2.   INR today ***. Hold heparin  Warfarin PTA '2mg'$  TTS, '4mg'$  MWFSun  Goal of Therapy:  INR 2- 2.5 Monitor platelets by anticoagulation protocol: Yes   Plan:   *** Start heparin when INR is less than 2  Monitor daily INR, CBC   Monitor for signs/symptoms of bleeding    Benetta Spar, PharmD, BCPS, BCCP Clinical Pharmacist  Please check AMION for all [place] phone numbers After 10:00 PM, call [place]

## 2022-11-28 ENCOUNTER — Inpatient Hospital Stay (HOSPITAL_COMMUNITY): Payer: Medicare HMO

## 2022-11-28 DIAGNOSIS — Z952 Presence of prosthetic heart valve: Secondary | ICD-10-CM | POA: Diagnosis not present

## 2022-11-28 DIAGNOSIS — I5022 Chronic systolic (congestive) heart failure: Secondary | ICD-10-CM | POA: Diagnosis not present

## 2022-11-28 DIAGNOSIS — L02214 Cutaneous abscess of groin: Secondary | ICD-10-CM | POA: Diagnosis not present

## 2022-11-28 LAB — CBC
HCT: 33.2 % — ABNORMAL LOW (ref 39.0–52.0)
Hemoglobin: 10.9 g/dL — ABNORMAL LOW (ref 13.0–17.0)
MCH: 30.2 pg (ref 26.0–34.0)
MCHC: 32.8 g/dL (ref 30.0–36.0)
MCV: 92 fL (ref 80.0–100.0)
Platelets: 179 10*3/uL (ref 150–400)
RBC: 3.61 MIL/uL — ABNORMAL LOW (ref 4.22–5.81)
RDW: 15.7 % — ABNORMAL HIGH (ref 11.5–15.5)
WBC: 4.7 10*3/uL (ref 4.0–10.5)
nRBC: 0 % (ref 0.0–0.2)

## 2022-11-28 LAB — BASIC METABOLIC PANEL
Anion gap: 14 (ref 5–15)
BUN: 48 mg/dL — ABNORMAL HIGH (ref 8–23)
CO2: 20 mmol/L — ABNORMAL LOW (ref 22–32)
Calcium: 9.2 mg/dL (ref 8.9–10.3)
Chloride: 103 mmol/L (ref 98–111)
Creatinine, Ser: 3 mg/dL — ABNORMAL HIGH (ref 0.61–1.24)
GFR, Estimated: 22 mL/min — ABNORMAL LOW (ref >60)
Glucose, Bld: 77 mg/dL (ref 70–99)
Potassium: 4.1 mmol/L (ref 3.5–5.1)
Sodium: 137 mmol/L (ref 135–145)

## 2022-11-28 LAB — COMPREHENSIVE METABOLIC PANEL
ALT: 31 U/L (ref 0–44)
AST: 29 U/L (ref 15–41)
Albumin: 3.9 g/dL (ref 3.5–5.0)
Alkaline Phosphatase: 136 U/L — ABNORMAL HIGH (ref 38–126)
Anion gap: 5 (ref 5–15)
BUN: 50 mg/dL — ABNORMAL HIGH (ref 8–23)
CO2: 27 mmol/L (ref 22–32)
Calcium: 9.2 mg/dL (ref 8.9–10.3)
Chloride: 106 mmol/L (ref 98–111)
Creatinine, Ser: 3 mg/dL — ABNORMAL HIGH (ref 0.61–1.24)
GFR, Estimated: 22 mL/min — ABNORMAL LOW (ref >60)
Glucose, Bld: 104 mg/dL — ABNORMAL HIGH (ref 70–99)
Potassium: 3.9 mmol/L (ref 3.5–5.1)
Sodium: 138 mmol/L (ref 135–145)
Total Bilirubin: 1.1 mg/dL (ref 0.3–1.2)
Total Protein: 7.3 g/dL (ref 6.5–8.1)

## 2022-11-28 LAB — MAGNESIUM: Magnesium: 2.2 mg/dL (ref 1.7–2.4)

## 2022-11-28 LAB — PROTIME-INR
INR: 3.2 — ABNORMAL HIGH (ref 0.8–1.2)
Prothrombin Time: 32.4 seconds — ABNORMAL HIGH (ref 11.4–15.2)

## 2022-11-28 LAB — LACTATE DEHYDROGENASE
LDH: 170 U/L (ref 98–192)
LDH: 183 U/L (ref 98–192)

## 2022-11-28 LAB — HIV ANTIBODY (ROUTINE TESTING W REFLEX): HIV Screen 4th Generation wRfx: NONREACTIVE

## 2022-11-28 MED ORDER — SODIUM CHLORIDE 0.9 % IV SOLN
2.0000 g | INTRAVENOUS | Status: DC
  Administered 2022-11-28 – 2022-11-29 (×2): 2 g via INTRAVENOUS
  Filled 2022-11-28 (×2): qty 20

## 2022-11-28 MED ORDER — VANCOMYCIN HCL 500 MG/100ML IV SOLN
500.0000 mg | INTRAVENOUS | Status: DC
  Administered 2022-11-29: 500 mg via INTRAVENOUS
  Filled 2022-11-28 (×2): qty 100

## 2022-11-28 MED ORDER — VANCOMYCIN HCL IN DEXTROSE 1-5 GM/200ML-% IV SOLN
1000.0000 mg | Freq: Once | INTRAVENOUS | Status: AC
  Administered 2022-11-28: 1000 mg via INTRAVENOUS
  Filled 2022-11-28: qty 200

## 2022-11-28 MED ORDER — FUROSEMIDE 40 MG PO TABS
60.0000 mg | ORAL_TABLET | Freq: Every day | ORAL | Status: DC
  Administered 2022-11-28 – 2022-12-04 (×7): 60 mg via ORAL
  Filled 2022-11-28 (×7): qty 1

## 2022-11-28 MED ORDER — CEFAZOLIN SODIUM-DEXTROSE 2-4 GM/100ML-% IV SOLN: 2.0000 g | Freq: Two times a day (BID) | INTRAVENOUS | Status: DC

## 2022-11-28 MED ORDER — ROSUVASTATIN CALCIUM 5 MG PO TABS
10.0000 mg | ORAL_TABLET | Freq: Every day | ORAL | Status: DC
  Administered 2022-11-28 – 2022-12-03 (×6): 10 mg via ORAL
  Filled 2022-11-28 (×6): qty 2

## 2022-11-28 MED ORDER — SODIUM CHLORIDE 0.9 % IV SOLN: 2.0000 g | INTRAVENOUS | Status: DC

## 2022-11-28 NOTE — Consult Note (Signed)
Surgical Evaluation Requesting provider: Dr. Haroldine Laws  Chief Complaint: Left groin abscess  HPI: 72 year old male with multiple significant medical problems as listed below and LVAD in place who has been admitted to the hospital with left groin abscess.  He has been having pain in the left groin for about a week which she states was coming and going however the last couple of days it has been more constant.  This is associated with swelling and some overlying skin irritation.  He denies any recent procedures in this area.  He underwent an ultrasound on 3/5 and again on 3/6 measuring a 1.6 cm possible abscess versus thrombosed pseudoaneurysm as well as an 8.8 x 2.3 x 4.5 cm fluid collection which is very simple and may represent seroma.  Subsequently was admitted to [facility] and underwent a CT of the pelvis today which describes a 2.2 x 1.5 cm superficial as cyst along the lateral aspect of the left hemiscrotum, a left inguinal hernia containing fat and small bilateral hydroceles.  I reviewed these images personally.  He has been afebrile, no leukocytosis.  He notes there has been some drainage from the painful area.  According to his wife, he was initially evaluated by Dr. Ferrel Logan at [facility] and she states that he pulled a piece of suture material out of this area..    Allergies  Allergen Reactions   Mushroom Extract Complex Nausea And Vomiting   Doxycycline Nausea And Vomiting   Neosporin [Neomycin-Bacitracin Zn-Polymyx] Hives   Tape Other (See Comments)    Some tapes/dressings can irritate the skin    Past Medical History:  Diagnosis Date   Arrhythmia    atrial fibrillation   CHF (congestive heart failure) (HCC)    Chronic kidney disease    Coronary artery disease    Hyperlipidemia    Hypertension    Myocardial infarct [facility])     Past Surgical History:  Procedure Laterality Date   CARDIAC DEFIBRILLATOR PLACEMENT  feb 2014   INSERTION OF IMPLANTABLE LEFT VENTRICULAR ASSIST  DEVICE N/A 09/17/2021   Procedure: INSERTION OF IMPLANTABLE LEFT VENTRICULAR ASSIST DEVICE AND INSERTION OF FEMORAL ARTERIAL LINE;  Surgeon: Gaye Pollack, MD;  Location: [place];  Service: Open Heart Surgery;  Laterality: N/A;   IR FLUORO GUIDE CV LINE RIGHT  09/27/2021   IR THORACENTESIS ASP PLEURAL SPACE W/IMG GUIDE  09/27/2021   IR US GUIDE VASC ACCESS RIGHT  09/27/2021   PLACEMENT OF IMPELLA LEFT VENTRICULAR ASSIST DEVICE N/A 09/12/2021   Procedure: PLACEMENT OF IMPELLA 5.5 LEFT VENTRICULAR ASSIST DEVICE;  Surgeon: Melrose Nakayama, MD;  Location: [place];  Service: Open Heart Surgery;  Laterality: N/A;   RIGHT HEART CATH N/A 09/08/2021   Procedure: RIGHT HEART CATH;  Surgeon: Larey Dresser, MD;  Location: [place] CV LAB;  Service: Cardiovascular;  Laterality: N/A;   TEE WITHOUT CARDIOVERSION N/A 09/12/2021   Procedure: TRANSESOPHAGEAL ECHOCARDIOGRAM (TEE);  Surgeon: Melrose Nakayama, MD;  Location: [place];  Service: Open Heart Surgery;  Laterality: N/A;   TEE WITHOUT CARDIOVERSION N/A 09/17/2021   Procedure: TRANSESOPHAGEAL ECHOCARDIOGRAM (TEE);  Surgeon: Gaye Pollack, MD;  Location: [place];  Service: Open Heart Surgery;  Laterality: N/A;    No family history on file.  Social History   Socioeconomic History   Marital status: Married    Spouse name: Not on file   Number of children: Not on file   Years of education: Not on file   Highest education level: Not  on file  Occupational History   Not on file  Tobacco Use   Smoking status: Former   Smokeless tobacco: Never  Substance and Sexual Activity   Alcohol use: No   Drug use: No   Sexual activity: Not on file  Other Topics Concern   Not on file  Social History Narrative   Not on file   Social Determinants of Health   Financial Resource Strain: Not on file  Food Insecurity: Not on file  Transportation Needs: Not on file  Physical Activity: Not on file  Stress: Not on file  Social Connections: Not on file     No current facility-administered medications on file prior to encounter.   Current Outpatient Medications on File Prior to Encounter  Medication Sig Dispense Refill   albuterol (VENTOLIN HFA) 108 (90 Base) MCG/ACT inhaler Inhale 2 puffs into the lungs every 6 (six) hours as needed. (Patient taking differently: Inhale 2 puffs into the lungs every 6 (six) hours as needed for wheezing or shortness of breath.) 1 each 3   amiodarone (PACERONE) 200 MG tablet Take 1 tablet (200 mg total) by mouth daily. Take 200 mg twice a day x 7 days, then resume a dose of 200 mg daily. (Patient taking differently: Take 200 mg by mouth daily.) 30 tablet 6   furosemide (LASIX) 40 MG tablet Take 1.5 tablets (60 mg total) by mouth daily. 70 tablet 3   pantoprazole (PROTONIX) 40 MG tablet TAKE 1 TABLET BY MOUTH EVERY DAY (Patient taking differently: Take 40 mg by mouth daily before breakfast.) 90 tablet 3   rosuvastatin (CRESTOR) 10 MG tablet TAKE 1 TABLET BY MOUTH EVERY DAY (Patient taking differently: Take 10 mg by mouth at bedtime.) 90 tablet 3   sildenafil (REVATIO) 20 MG tablet TAKE 1 TABLET BY MOUTH THREE TIMES DAILY 270 tablet 2   warfarin (COUMADIN) 2 MG tablet Take 4 mg (2 tabs) every Monday/Wednesday/Friday and 2 mg (1 tab) all other days or as directed by the [facility]. (Patient taking differently: Take 2-4 mg by mouth See admin instructions. Beginning on 11/27/2022, take 4 mg by mouth once a day on Fri/Sun/Mon/Wed and 2 mg on Sat/Tues. Next INR is on 12/03/2022.) 120 tablet 11   enoxaparin (LOVENOX) 40 MG/0.4ML injection Inject 0.4 mLs (40 mg total) into the skin every 12 (twelve) hours. (Patient not taking: Reported on 11/27/2022) 4 mL 0   potassium chloride SA (KLOR-CON M) 20 MEQ tablet Take 1 tablet (20 mEq total) by mouth daily. (Patient not taking: Reported on 11/27/2022) 30 tablet 6    Review of Systems: a complete, 10pt review of systems was completed with pertinent positives and negatives as  documented in the HPI  Physical Exam: Vitals:   11/28/22 0800 11/28/22 1111  BP: (!) 86/70 97/71  Pulse:  63  Resp:  14  Temp:  97.6 F (36.4 C)  SpO2:  98%   Gen: A&Ox3, no distress  Unlabored respirations In the left groin just medial to the groin crease is a superficial area of tenderness, erythema, internation and warmth.  There is a punctate area of purulent drainage here consistent with self draining small subcutaneous abscess.      Latest Ref Rng & Units 11/28/2022   12:14 AM 11/27/2022   11:04 PM 10/07/2022   11:19 AM  CBC  WBC 4.0 - 10.5 K/uL 4.7  4.7  4.8   Hemoglobin 13.0 - 17.0 g/dL 10.9  12.0  11.7   Hematocrit 39.0 -  52.0 % 33.2  37.7  35.8   Platelets 150 - 400 K/uL 179  199  210        Latest Ref Rng & Units 11/28/2022   12:14 AM 11/27/2022   11:04 PM 10/07/2022   11:19 AM  CMP  Glucose 70 - 99 mg/dL 77  104  83   BUN 8 - 23 mg/dL 48  50  35   Creatinine 0.61 - 1.24 mg/dL 3.00  3.00  2.88   Sodium 135 - 145 mmol/L 137  138  137   Potassium 3.5 - 5.1 mmol/L 4.1  3.9  4.3   Chloride 98 - 111 mmol/L 103  106  106   CO2 22 - 32 mmol/L '20  27  23   '$ Calcium 8.9 - 10.3 mg/dL 9.2  9.2  8.9   Total Protein 6.5 - 8.1 g/dL  7.3  7.1   Total Bilirubin 0.3 - 1.2 mg/dL  1.1  0.7   Alkaline Phos 38 - 126 U/L  136  125   AST 15 - 41 U/L  29  21   ALT 0 - 44 U/L  31  21     Lab Results  Component Value Date   INR 3.2 (H) 11/28/2022   INR 2.8 (H) 11/27/2022   INR 3.1 (A) 11/26/2022    Imaging: CT PELVIS WO CONTRAST  Result Date: 11/28/2022 CLINICAL DATA:  Soft tissue infection suspected. LEFT groin abscess. EXAM: CT PELVIS WITHOUT CONTRAST TECHNIQUE: Multidetector CT imaging of the pelvis was performed following the standard protocol without intravenous contrast. RADIATION DOSE REDUCTION: This exam was performed according to the departmental dose-optimization program which includes automated exposure control, adjustment of the mA and/or kV according to patient size  and/or use of iterative reconstruction technique. COMPARISON:  09/09/2021 FINDINGS: Urinary Tract: Distal ureters and urinary bladder are normal in appearance. Bowel: There is colonic diverticulosis without acute diverticulitis. Normal appendix. Vascular/Lymphatic: There is dense atherosclerotic calcification of the abdominal aorta, not associated with aneurysm. There are mildly prominent LEFT inguinal lymph nodes, likely reactive Reproductive: Prostate has a normal appearance. There are small bilateral hydroceles. There is a fat containing LEFT inguinal hernia, with fluid collection in the LEFT mid inguinal canal. A small rim enhancing mass is identified along the LATERAL aspect of the LEFT hemiscrotum, measuring 2.2 x 1.5 centimeters. (Image 46 of series 3). Finding is consistent with small superficial abscess. Other:  No ascites. Musculoskeletal: There are degenerative changes in the lumbar spine. IMPRESSION: 1. 2.2 x 1.5 centimeter superficial abscess along the LATERAL aspect of the LEFT hemiscrotum. 2. LEFT inguinal hernia contains fat and fluid collection within the mid inguinal canal. 3. Small bilateral hydroceles. 4. Colonic diverticulosis without acute diverticulitis. 5. Degenerative changes in the lumbar spine. 6.  Aortic Atherosclerosis (ICD10-I70.0). Electronically Signed   By: Nolon Nations M.D.   On: 11/28/2022 11:32       A/P: 72 year old gentleman with LVAD and other medical issues who presents with a left groin abscess which appears to be spontaneously draining.  This is indicated by the little measuring stick that I placed on the picture above.  Would recommend warm compresses, start antibiotics, and IR eval for possible ultrasound- guided aspiration if there is any fluid left.  Reassuring that he is afebrile with normal white count.   This is separate from the larger fluid collection medially, which in all likelihood represents a seroma -this was present on his CT scan in December 2022  and  would not recommend intervention for this. Will continue to follow.    Patient Active Problem List   Diagnosis Date Noted   Abscess of left groin 11/27/2022   VT (ventricular tachycardia) ([place]) 10/27/2022   ICD (implantable cardioverter-defibrillator) in place 10/27/2022   Pressure injury of skin 10/07/2021   LVAD (left ventricular assist device) present ([place])    Protein-calorie malnutrition, severe 09/10/2021   Elevated TSH    PICC (peripherally inserted central catheter) in place    Cardiogenic shock ([place])    Syncope 09/07/2021   CHF (congestive heart failure) ([place]) 09/06/2021   CAD (coronary artery disease) 08/22/2021   Cardiomyopathy ([place]) 08/22/2021   Mixed hyperlipidemia 08/22/2021   A-fib ([place]) 08/22/2021   Elevated troponin 08/22/2021   Acute on chronic combined systolic and diastolic CHF (congestive heart failure) ([place]) 08/22/2021   Benign essential hypertension 02/22/2015   Acute on chronic systolic CHF (congestive heart failure), NYHA class 3 ([place]) 07/05/2014       Romana Juniper, MD [facility]  See AMION to contact appropriate on-call provider

## 2022-11-28 NOTE — Progress Notes (Addendum)
Pharmacy Antibiotic Note  Isaac [name] is a 72 y.o. male admitted on 11/27/2022 with  groin abscess .  Pharmacy has been consulted for vancomycin dosing.  WBC 4.7. Creatinine 3 with CrCl of 25 ml/min. Surgery noted okay to start abx before drainage. No AUC dosing with LVAD.  Plan: Load with vancomycin 1000 mg once, then start vancomycin 500 mg every 24 hours Levels at steady state if continued  Weight: 86 kg (189 lb 9.5 oz)  Temp (24hrs), Avg:97.6 F (36.4 C), Min:97.4 F (36.3 C), Max:97.7 F (36.5 C)  Recent Labs  Lab 11/27/22 2304 11/28/22 0014  WBC 4.7 4.7  CREATININE 3.00* 3.00*    Estimated Creatinine Clearance: 24.9 mL/min (A) (by C-G formula based on SCr of 3 mg/dL (H)).    Allergies  Allergen Reactions   Mushroom Extract Complex Nausea And Vomiting   Doxycycline Nausea And Vomiting   Neosporin [Neomycin-Bacitracin Zn-Polymyx] Hives   Tape Other (See Comments)    Some tapes/dressings can irritate the skin    Antimicrobials this admission: Vanc 3/9 >>  CTX 3/9 >>   Dose adjustments this admission: none  Microbiology results: 3/8 BCx: IP 3/8 MRSA PCR: neg  Thank you for involving pharmacy in this patient's care.  Reatha Harps, PharmD PGY2 Pharmacy Resident 11/28/2022 3:16 PM

## 2022-11-28 NOTE — Progress Notes (Signed)
ANTICOAGULATION CONSULT NOTE - Initial Consult  Pharmacy Consult for warfarin Indication:  LVAD (HM3) and hx of AF  Allergies  Allergen Reactions   Mushroom Extract Complex Nausea And Vomiting   Doxycycline Nausea And Vomiting   Neosporin [Neomycin-Bacitracin Zn-Polymyx] Hives   Tape Other (See Comments)    Some tapes/dressings can irritate the skin    Patient Measurements: Weight: 86 kg (189 lb 9.5 oz)  Vital Signs: Temp: 97.6 F (36.4 C) (03/09 1111) Temp Source: Oral (03/09 1111) BP: 97/71 (03/09 1111) Pulse Rate: 63 (03/09 1111)  Labs: Recent Labs    11/26/22 0000 11/27/22 2304 11/28/22 0014 11/28/22 1212  HGB  --  12.0* 10.9*  --   HCT  --  37.7* 33.2*  --   PLT  --  199 179  --   LABPROT  --  29.6*  --  32.4*  INR 3.1* 2.8*  --  3.2*  CREATININE  --  3.00* 3.00*  --     Estimated Creatinine Clearance: 24.9 mL/min (A) (by C-G formula based on SCr of 3 mg/dL (H)).   Medical History: Past Medical History:  Diagnosis Date   Arrhythmia    atrial fibrillation   CHF (congestive heart failure) (HCC)    Chronic kidney disease    Coronary artery disease    Hyperlipidemia    Hypertension    Myocardial infarct (HCC)     Medications:  Medications Prior to Admission  Medication Sig Dispense Refill Last Dose   albuterol (VENTOLIN HFA) 108 (90 Base) MCG/ACT inhaler Inhale 2 puffs into the lungs every 6 (six) hours as needed. (Patient taking differently: Inhale 2 puffs into the lungs every 6 (six) hours as needed for wheezing or shortness of breath.) 1 each 3 unk   amiodarone (PACERONE) 200 MG tablet Take 1 tablet (200 mg total) by mouth daily. Take 200 mg twice a day x 7 days, then resume a dose of 200 mg daily. (Patient taking differently: Take 200 mg by mouth daily.) 30 tablet 6 11/27/2022 at 0930   furosemide (LASIX) 40 MG tablet Take 1.5 tablets (60 mg total) by mouth daily. 70 tablet 3 11/27/2022 at am   pantoprazole (PROTONIX) 40 MG tablet TAKE 1 TABLET BY MOUTH  EVERY DAY (Patient taking differently: Take 40 mg by mouth daily before breakfast.) 90 tablet 3 11/27/2022 at am   rosuvastatin (CRESTOR) 10 MG tablet TAKE 1 TABLET BY MOUTH EVERY DAY (Patient taking differently: Take 10 mg by mouth at bedtime.) 90 tablet 3 11/26/2022 at pm   sildenafil (REVATIO) 20 MG tablet TAKE 1 TABLET BY MOUTH THREE TIMES DAILY 270 tablet 2 11/27/2022 at am   warfarin (COUMADIN) 2 MG tablet Take 4 mg (2 tabs) every Monday/Wednesday/Friday and 2 mg (1 tab) all other days or as directed by the [facility]. (Patient taking differently: Take 2-4 mg by mouth See admin instructions. Beginning on 11/27/2022, take 4 mg by mouth once a day on Fri/Sun/Mon/Wed and 2 mg on Sat/Tues. Next INR is on 12/03/2022.) 120 tablet 11 11/25/2022 at 1900   enoxaparin (LOVENOX) 40 MG/0.4ML injection Inject 0.4 mLs (40 mg total) into the skin every 12 (twelve) hours. (Patient not taking: Reported on 11/27/2022) 4 mL 0 Not Taking   potassium chloride SA (KLOR-CON M) 20 MEQ tablet Take 1 tablet (20 mEq total) by mouth daily. (Patient not taking: Reported on 11/27/2022) 30 tablet 6 Not Taking   Scheduled:   amiodarone  200 mg Oral Daily   furosemide  60 mg Oral Daily   pantoprazole  40 mg Oral Daily   rosuvastatin  10 mg Oral QHS   sildenafil  20 mg Oral TID   Infusions:    ceFAZolin (ANCEF) IV      Assessment: Patient admitted for groin abscess. Started on cefazolin. Warfarin PTA dose of 4 mg all days except 2 mg on Tuesday and Saturday. INR increased from 2.8 this AM to 3.2 this afternoon. CBC is stable. No bleeding noted. Discussed with HF MD and will hold tonight's dose.  Goal of Therapy:  INR 2-2.5  Plan:  Hold warfarin tonight Daily INR and CBC  Thank you for involving pharmacy in this patient's care.  Reatha Harps, PharmD PGY2 Pharmacy Resident 11/28/2022 1:31 PM

## 2022-11-28 NOTE — Progress Notes (Signed)
Mobility Specialist Progress Note    11/28/22 1513  Mobility  Activity Ambulated independently in hallway  Level of Assistance Standby assist, set-up cues, supervision of patient - no hands on  Assistive Device None  Distance Ambulated (ft) 420 ft  Activity Response Tolerated well  Mobility Referral Yes  $Mobility charge 1 Mobility   Pre-Mobility: 63 HR, 100% SpO2  Pt received in bed and agreeable. C/o groin pain on walk. Returned to sitting EOB with call bell in reach.   Hildred Alamin Mobility Specialist  Please Psychologist, sport and exercise or Rehab Office at [phone]

## 2022-11-28 NOTE — Progress Notes (Addendum)
Advanced Heart Failure VAD History and Physical Note   PCP-Cardiologist: Dr. Aundra Dubin   Reason for Admission: Left Groin Abscess   HPI/Interval hx:    72 y.o. with history of CAD, HTN, atrial fibrillation, and chronic systolic CHF s/p HMIII LVAD.    Patient has had a long-standing cardiomyopathy, EF had been in the 25% range for years.  He had PCI to OM1 in 2007 and RCA in 2013.  From his report, these episodes do not sound like ACS events.  Echo in 12/22 showed EF < 20%, severe LV dilation, restrictive diastolic function, moderate RV dysfunction, moderate MR, mod-severe TR. HF was been complicated by CKD stage 3. He was also noted on device interrogation to have been in atrial fibrillation persistently since 10/22.  He was admitted at [facility] in 12/22.  BP was low, his BP-active meds were stopped and midodrine was begun.  He was diuresed and discharged home.     He reported ongoing severe dyspnea and was admitted again in 12/22 after he stood up then passed out at home.  Device interrogation showed run of VT treated x 2 with ATP possibly around the time of syncope.  RHC in 12/22 showed low output HF and he developed cardiogenic shock. Impella 5.5 was placed.  On 09/17/21, HM3 LVAD was placed, ICD leads were plastered to the tricuspid valve with severe TR, the valve was not replaced.  The patient had significant interoperative hypotension.  Post-operatively, he developed progressive renal failure and was started on CVVH then iHD.  He had a bumpy post-op course with renal failure and RV failure, but was ultimately able to discharge home on 10/24/21.    Ramp echo in 3/23, RV mildly dilated with normal systolic function, severe TR, LV EF 20-25%, aortic valve opens every beat.  Speed increased to 5300 rpm. The IV septum was mildly leftwards at 5300 rpm but flow increased and AoV still opened every beat.   He is now off HD with creatinine stable, most recently 2.8.    Pt contacted VAD clinic on 3/4 w/  complaints of left groin pain and swelling. Sent for imaging and found to have an abscess. He is being direct admitted for further management and initiation of IV abx. According to Mr. Niazi, pain in his left groin has been progressing over the past 2 weeks now; he now reports it as a 7/10. No fevers, chills, diarrhea, nausea or systemic signs of illness.   Interval hx/subjective:  No complaints today other than significant tenderness at left groin.   LVAD INTERROGATION:  HeartMate II LVAD:  Flow 4.6 liters/min, speed 5300, power 4.2, PI 3.1.  No speed drops or alarms.     Objective:    Vital Signs:   109/98  Physical Exam    General:  Well appearing. No resp difficulty HEENT: Normal Neck: supple. JVP 9cm . Carotids 2+ bilat; no bruits. No lymphadenopathy or thyromegaly appreciated. Cor: Mechanical heart sounds with LVAD hum present. Lungs: Clear Abdomen: soft, nontender, nondistended. No hepatosplenomegaly. No bruits or masses. Good bowel sounds. Driveline: C/D/I; securement device intact and driveline incorporated Extremities: no cyanosis, clubbing, rash, edema left groin swollen and extremely tender to palpation Neuro: alert & orientedx3, cranial nerves grossly intact. moves all 4 extremities w/o difficulty. Affect pleasant   Telemetry   afib  EKG   pending  Labs    Basic Metabolic Panel: Recent Labs  Lab 11/27/22 2304 11/28/22 0014  NA 138 137  K 3.9 4.1  CL  106 103  CO2 27 20*  GLUCOSE 104* 77  BUN 50* 48*  CREATININE 3.00* 3.00*  CALCIUM 9.2 9.2  MG 2.2  --      Liver Function Tests: Recent Labs  Lab 11/27/22 2304  AST 29  ALT 31  ALKPHOS 136*  BILITOT 1.1  PROT 7.3  ALBUMIN 3.9    CBC: Recent Labs  Lab 11/27/22 2304 11/28/22 0014  WBC 4.7 4.7  NEUTROABS 2.6  --   HGB 12.0* 10.9*  HCT 37.7* 33.2*  MCV 93.1 92.0  PLT 199 179     BNP: BNP (last 3 results) Recent Labs    07/02/22 1642  BNP 501.5*     Coagulation  Studies: Recent Labs    11/26/22 0000 11/27/22 2304 11/28/22 1212  LABPROT  --  29.6* 32.4*  INR 3.1* 2.8* 3.2*     Other results: pending  Imaging    CT PELVIS WO CONTRAST  Result Date: 11/28/2022 CLINICAL DATA:  Soft tissue infection suspected. LEFT groin abscess. EXAM: CT PELVIS WITHOUT CONTRAST TECHNIQUE: Multidetector CT imaging of the pelvis was performed following the standard protocol without intravenous contrast. RADIATION DOSE REDUCTION: This exam was performed according to the departmental dose-optimization program which includes automated exposure control, adjustment of the mA and/or kV according to patient size and/or use of iterative reconstruction technique. COMPARISON:  09/09/2021 FINDINGS: Urinary Tract: Distal ureters and urinary bladder are normal in appearance. Bowel: There is colonic diverticulosis without acute diverticulitis. Normal appendix. Vascular/Lymphatic: There is dense atherosclerotic calcification of the abdominal aorta, not associated with aneurysm. There are mildly prominent LEFT inguinal lymph nodes, likely reactive Reproductive: Prostate has a normal appearance. There are small bilateral hydroceles. There is a fat containing LEFT inguinal hernia, with fluid collection in the LEFT mid inguinal canal. A small rim enhancing mass is identified along the LATERAL aspect of the LEFT hemiscrotum, measuring 2.2 x 1.5 centimeters. (Image 46 of series 3). Finding is consistent with small superficial abscess. Other:  No ascites. Musculoskeletal: There are degenerative changes in the lumbar spine. IMPRESSION: 1. 2.2 x 1.5 centimeter superficial abscess along the LATERAL aspect of the LEFT hemiscrotum. 2. LEFT inguinal hernia contains fat and fluid collection within the mid inguinal canal. 3. Small bilateral hydroceles. 4. Colonic diverticulosis without acute diverticulitis. 5. Degenerative changes in the lumbar spine. 6.  Aortic Atherosclerosis (ICD10-I70.0). Electronically  Signed   By: Nolon Nations M.D.   On: 11/28/2022 11:32      Patient Profile:   72 y.o. with history of CAD, HTN, atrial fibrillation, chronic systolic CHF s/p HMIII LVAD and CKD IV, direct admitted for treatment of left groin abscess.   Assessment/Plan:    1. Left hemiscrotum abscess - Blood cx pending - CT pelvis w/ 2.2x1.5cm superficial left hemiscrotum abscess; on exam very tender to palpation with diffuse erythema and some induration.  - Will consult gen surgery for further management.  - start ancef  - holding warfarin today, INR 3.2.   2. Chronic systolic CHF:  Long-standing cardiomyopathy.  [place].  Echo in 12/22 with EF < 20%, severe LV dilation, restrictive diastolic function, moderate RV dysfunction, moderate MR, mod-severe TR. Cause of cardiomyopathy is uncertain.  He has a history of CAD, but I do not think that the described CAD from the past could explain his cardiomyopathy, but CAD could have progressed.  He was admitted in 12/22 with cardiogenic shock and Impella 5.5. s/p HM III VAD on 09/17/22.  Post-op course complicated by  AKI and RV failure. He was on HD for several months but is now off.   - Continue Lasix 60 mg daily.  - Continue sildenafil 20 tid for RV.   - On Warfarin. INR goal 2-2.5.   - INR 3.2 today; holding warfarin.   3. Tricuspid regurgitation:  Tricuspid repair not done at time of VAD due to proximity of ICD wires and hypotension during surgery.  He has severe TR.   4. Atrial fibrillation:  - Continue amiodarone 200 mg daily - Hold Coumadin per above, cover w/ heparin gtt   5. CKD stage IV: Suspect intra-op hypotension at the time of LVAD placement led to development of ATN => urine sediment looked like ATN per renal. Had initial CVVH and transitioned over to HD. He is now off HD with creatinine stable, most recently 2.8.   - sCr 3 today; JVP elevated. Will restart home lasix.   6. CAD: History of PCI to OM1 in 2007 and RCA in 2013.   No CP or ACs.  - Deferred cardiac cath in 12/22 due to AKI and plan for VAD - Continue Crestor.     I reviewed the LVAD parameters from today, and compared the results to the patient's prior recorded data.  No programming changes were made.  The LVAD is functioning within specified parameters.  The patient performs LVAD self-test daily.  LVAD interrogation was negative for any significant power changes, alarms or PI events/speed drops.  LVAD equipment check completed and is in good working order.  Back-up equipment present.   LVAD education done on emergency procedures and precautions and reviewed exit site care.  Katrine Radich Advanced Heart Failure   Advanced Heart Failure Team Pager [phone] (M-F; 7a - 5p)  Please contact [facility] for night-coverage after hours (5p -7a ) and weekends on [email or web address] for all non- LVAD Issues

## 2022-11-28 NOTE — Plan of Care (Signed)
Request to IR for possible aspiration of left groin abscess - CT pelvis w/o contrast obtained today and reviewed by Dr. Earleen Newport who notes that he has simple fluid in a left inguinal hernia (ascites) and superficial spot on the skin which corresponds to the phlegmon on prior US. There is no evidence of formed abscess, just inflammation/phlegmon - as such there is no role for aspiration at this time.   Recommend antibiotics and if fails to improve re-consult IR.  Above relayed to Forestine Na, NP via Epic secure chat this afternoon.  IR consult order will be deleted - please place a new order or contact on call IR MD if it is felt this patient would benefit from re-evaluation.  Candiss Norse, PA-C

## 2022-11-28 NOTE — H&P (Deleted)
Advanced Heart Failure VAD History and Physical Note   PCP-Cardiologist: Dr. Aundra Dubin   Reason for Admission: Left Groin Abscess   HPI/Interval hx:    72 y.o. with history of CAD, HTN, atrial fibrillation, and chronic systolic CHF s/p HMIII LVAD.    Patient has had a long-standing cardiomyopathy, EF had been in the 25% range for years.  He had PCI to OM1 in 2007 and RCA in 2013.  From his report, these episodes do not sound like ACS events.  Echo in 12/22 showed EF < 20%, severe LV dilation, restrictive diastolic function, moderate RV dysfunction, moderate MR, mod-severe TR. HF was been complicated by CKD stage 3. He was also noted on device interrogation to have been in atrial fibrillation persistently since 10/22.  He was admitted at [facility] in 12/22.  BP was low, his BP-active meds were stopped and midodrine was begun.  He was diuresed and discharged home.     He reported ongoing severe dyspnea and was admitted again in 12/22 after he stood up then passed out at home.  Device interrogation showed run of VT treated x 2 with ATP possibly around the time of syncope.  RHC in 12/22 showed low output HF and he developed cardiogenic shock. Impella 5.5 was placed.  On 09/17/21, HM3 LVAD was placed, ICD leads were plastered to the tricuspid valve with severe TR, the valve was not replaced.  The patient had significant interoperative hypotension.  Post-operatively, he developed progressive renal failure and was started on CVVH then iHD.  He had a bumpy post-op course with renal failure and RV failure, but was ultimately able to discharge home on 10/24/21.    Ramp echo in 3/23, RV mildly dilated with normal systolic function, severe TR, LV EF 20-25%, aortic valve opens every beat.  Speed increased to 5300 rpm. The IV septum was mildly leftwards at 5300 rpm but flow increased and AoV still opened every beat.   He is now off HD with creatinine stable, most recently 2.8.    Pt contacted VAD clinic on 3/4 w/  complaints of left groin pain and swelling. Sent for imaging and found to have an abscess. He is being direct admitted for further management and initiation of IV abx. According to Mr. Coon, pain in his left groin has been progressing over the past 2 weeks now; he now reports it as a 7/10. No fevers, chills, diarrhea, nausea or systemic signs of illness.   Interval hx/subjective:  No complaints today other than significant tenderness at left groin.   LVAD INTERROGATION:  HeartMate II LVAD:  Flow 4.6 liters/min, speed 5300, power 4.2, PI 3.1.  No speed drops or alarms.     Objective:    Vital Signs:   109/98  Physical Exam    General:  Well appearing. No resp difficulty HEENT: Normal Neck: supple. JVP 9cm . Carotids 2+ bilat; no bruits. No lymphadenopathy or thyromegaly appreciated. Cor: Mechanical heart sounds with LVAD hum present. Lungs: Clear Abdomen: soft, nontender, nondistended. No hepatosplenomegaly. No bruits or masses. Good bowel sounds. Driveline: C/D/I; securement device intact and driveline incorporated Extremities: no cyanosis, clubbing, rash, edema left groin swollen and extremely tender to palpation Neuro: alert & orientedx3, cranial nerves grossly intact. moves all 4 extremities w/o difficulty. Affect pleasant   Telemetry   afib  EKG   pending  Labs    Basic Metabolic Panel: Recent Labs  Lab 11/27/22 2304 11/28/22 0014  NA 138 137  K 3.9 4.1  CL  106 103  CO2 27 20*  GLUCOSE 104* 77  BUN 50* 48*  CREATININE 3.00* 3.00*  CALCIUM 9.2 9.2  MG 2.2  --     Liver Function Tests: Recent Labs  Lab 11/27/22 2304  AST 29  ALT 31  ALKPHOS 136*  BILITOT 1.1  PROT 7.3  ALBUMIN 3.9   CBC: Recent Labs  Lab 11/27/22 2304 11/28/22 0014  WBC 4.7 4.7  NEUTROABS 2.6  --   HGB 12.0* 10.9*  HCT 37.7* 33.2*  MCV 93.1 92.0  PLT 199 179    BNP: BNP (last 3 results) Recent Labs    07/02/22 1642  BNP 501.5*     Coagulation Studies: Recent  Labs    11/26/22 0000 11/27/22 2304 11/28/22 1212  LABPROT  --  29.6* 32.4*  INR 3.1* 2.8* 3.2*     Other results: pending  Imaging    CT PELVIS WO CONTRAST  Result Date: 11/28/2022 CLINICAL DATA:  Soft tissue infection suspected. LEFT groin abscess. EXAM: CT PELVIS WITHOUT CONTRAST TECHNIQUE: Multidetector CT imaging of the pelvis was performed following the standard protocol without intravenous contrast. RADIATION DOSE REDUCTION: This exam was performed according to the departmental dose-optimization program which includes automated exposure control, adjustment of the mA and/or kV according to patient size and/or use of iterative reconstruction technique. COMPARISON:  09/09/2021 FINDINGS: Urinary Tract: Distal ureters and urinary bladder are normal in appearance. Bowel: There is colonic diverticulosis without acute diverticulitis. Normal appendix. Vascular/Lymphatic: There is dense atherosclerotic calcification of the abdominal aorta, not associated with aneurysm. There are mildly prominent LEFT inguinal lymph nodes, likely reactive Reproductive: Prostate has a normal appearance. There are small bilateral hydroceles. There is a fat containing LEFT inguinal hernia, with fluid collection in the LEFT mid inguinal canal. A small rim enhancing mass is identified along the LATERAL aspect of the LEFT hemiscrotum, measuring 2.2 x 1.5 centimeters. (Image 46 of series 3). Finding is consistent with small superficial abscess. Other:  No ascites. Musculoskeletal: There are degenerative changes in the lumbar spine. IMPRESSION: 1. 2.2 x 1.5 centimeter superficial abscess along the LATERAL aspect of the LEFT hemiscrotum. 2. LEFT inguinal hernia contains fat and fluid collection within the mid inguinal canal. 3. Small bilateral hydroceles. 4. Colonic diverticulosis without acute diverticulitis. 5. Degenerative changes in the lumbar spine. 6.  Aortic Atherosclerosis (ICD10-I70.0). Electronically Signed   By:  Nolon Nations M.D.   On: 11/28/2022 11:32      Patient Profile:   72 y.o. with history of CAD, HTN, atrial fibrillation, chronic systolic CHF s/p HMIII LVAD and CKD IV, direct admitted for treatment of left groin abscess.   Assessment/Plan:    1. Left hemiscrotum abscess - Blood cx pending - CT pelvis w/ 2.2x1.5cm superficial left hemiscrotum abscess; on exam very tender to palpation with diffuse erythema and some induration.  - Will consult gen surgery for further management.  - start ancef  - holding warfarin today, INR 3.2.   2. Chronic systolic CHF:  Long-standing cardiomyopathy.  [place].  Echo in 12/22 with EF < 20%, severe LV dilation, restrictive diastolic function, moderate RV dysfunction, moderate MR, mod-severe TR. Cause of cardiomyopathy is uncertain.  He has a history of CAD, but I do not think that the described CAD from the past could explain his cardiomyopathy, but CAD could have progressed.  He was admitted in 12/22 with cardiogenic shock and Impella 5.5. s/p HM III VAD on 09/17/22.  Post-op course complicated by AKI and RV  failure. He was on HD for several months but is now off.   - Continue Lasix 60 mg daily.  - Continue sildenafil 20 tid for RV.   - On Warfarin. INR goal 2-2.5.   - INR 3.2 today; holding warfarin.   3. Tricuspid regurgitation:  Tricuspid repair not done at time of VAD due to proximity of ICD wires and hypotension during surgery.  He has severe TR.   4. Atrial fibrillation:  - Continue amiodarone 200 mg daily - Hold Coumadin per above, cover w/ heparin gtt   5. CKD stage IV: Suspect intra-op hypotension at the time of LVAD placement led to development of ATN => urine sediment looked like ATN per renal. Had initial CVVH and transitioned over to HD. He is now off HD with creatinine stable, most recently 2.8.   - check BMP   6. CAD: History of PCI to OM1 in 2007 and RCA in 2013.  No CP or ACs.  - Deferred cardiac cath in 12/22 due to  AKI and plan for VAD - Continue Crestor.     I reviewed the LVAD parameters from today, and compared the results to the patient's prior recorded data.  No programming changes were made.  The LVAD is functioning within specified parameters.  The patient performs LVAD self-test daily.  LVAD interrogation was negative for any significant power changes, alarms or PI events/speed drops.  LVAD equipment check completed and is in good working order.  Back-up equipment present.   LVAD education done on emergency procedures and precautions and reviewed exit site care.  Vanesha Athens Advanced Heart Failure   Advanced Heart Failure Team Pager [phone] (M-F; 7a - 5p)  Please contact [facility] for night-coverage after hours (5p -7a ) and weekends on [email or web address] for all non- LVAD Issues

## 2022-11-28 NOTE — Plan of Care (Signed)
  Problem: Education: Goal: Patient will understand all VAD equipment and how it functions Outcome: Progressing Goal: Patient will be able to verbalize current INR target range and antiplatelet therapy for discharge home Outcome: Progressing   Problem: Cardiac: Goal: LVAD will function as expected and patient will experience no clinical alarms Outcome: Progressing   Problem: Education: Goal: Knowledge of General Education information will improve Description: Including pain rating scale, medication(s)/side effects and non-pharmacologic comfort measures Outcome: Progressing   Problem: Clinical Measurements: Goal: Ability to maintain clinical measurements within normal limits will improve Outcome: Progressing Goal: Respiratory complications will improve Outcome: Progressing Goal: Cardiovascular complication will be avoided Outcome: Progressing   

## 2022-11-28 NOTE — Progress Notes (Signed)
Patient refused to have a standing weight taken this morning.

## 2022-11-29 DIAGNOSIS — Z952 Presence of prosthetic heart valve: Secondary | ICD-10-CM | POA: Diagnosis not present

## 2022-11-29 DIAGNOSIS — L02214 Cutaneous abscess of groin: Secondary | ICD-10-CM | POA: Diagnosis not present

## 2022-11-29 DIAGNOSIS — I5022 Chronic systolic (congestive) heart failure: Secondary | ICD-10-CM | POA: Diagnosis not present

## 2022-11-29 LAB — BASIC METABOLIC PANEL
Anion gap: 11 (ref 5–15)
BUN: 36 mg/dL — ABNORMAL HIGH (ref 8–23)
CO2: 23 mmol/L (ref 22–32)
Calcium: 9 mg/dL (ref 8.9–10.3)
Chloride: 100 mmol/L (ref 98–111)
Creatinine, Ser: 2.52 mg/dL — ABNORMAL HIGH (ref 0.61–1.24)
GFR, Estimated: 27 mL/min — ABNORMAL LOW (ref >60)
Glucose, Bld: 74 mg/dL (ref 70–99)
Potassium: 4.1 mmol/L (ref 3.5–5.1)
Sodium: 134 mmol/L — ABNORMAL LOW (ref 135–145)

## 2022-11-29 LAB — CBC
HCT: 35.7 % — ABNORMAL LOW (ref 39.0–52.0)
Hemoglobin: 11.6 g/dL — ABNORMAL LOW (ref 13.0–17.0)
MCH: 29.9 pg (ref 26.0–34.0)
MCHC: 32.5 g/dL (ref 30.0–36.0)
MCV: 92 fL (ref 80.0–100.0)
Platelets: 206 10*3/uL (ref 150–400)
RBC: 3.88 MIL/uL — ABNORMAL LOW (ref 4.22–5.81)
RDW: 15.8 % — ABNORMAL HIGH (ref 11.5–15.5)
WBC: 3.7 10*3/uL — ABNORMAL LOW (ref 4.0–10.5)
nRBC: 0 % (ref 0.0–0.2)

## 2022-11-29 LAB — PROTIME-INR
INR: 2.9 — ABNORMAL HIGH (ref 0.8–1.2)
Prothrombin Time: 30.1 seconds — ABNORMAL HIGH (ref 11.4–15.2)

## 2022-11-29 LAB — LACTATE DEHYDROGENASE: LDH: 156 U/L (ref 98–192)

## 2022-11-29 MED ORDER — WARFARIN 0.5 MG HALF TABLET
0.5000 mg | ORAL_TABLET | Freq: Once | ORAL | Status: AC
  Administered 2022-11-29: 0.5 mg via ORAL
  Filled 2022-11-29: qty 1

## 2022-11-29 MED ORDER — TRAMADOL HCL 50 MG PO TABS
50.0000 mg | ORAL_TABLET | Freq: Two times a day (BID) | ORAL | Status: DC | PRN
  Administered 2022-11-29 – 2022-12-01 (×3): 50 mg via ORAL
  Filled 2022-11-29 (×3): qty 1

## 2022-11-29 MED ORDER — ORAL CARE MOUTH RINSE: 15.0000 mL | OROMUCOSAL | Status: DC | PRN

## 2022-11-29 MED ORDER — WARFARIN - PHARMACIST DOSING INPATIENT: Freq: Every day | Status: DC

## 2022-11-29 NOTE — Progress Notes (Signed)
Advanced Heart Failure VAD History and Physical Note   PCP-Cardiologist: Dr. Aundra Dubin   Reason for Admission: Left Groin Abscess   HPI/Interval hx:    72 y.o. with history of CAD, HTN, atrial fibrillation, and chronic systolic CHF s/p HMIII LVAD.    Patient has had a long-standing cardiomyopathy, EF had been in the 25% range for years.  He had PCI to OM1 in 2007 and RCA in 2013.  From his report, these episodes do not sound like ACS events.  Echo in 12/22 showed EF < 20%, severe LV dilation, restrictive diastolic function, moderate RV dysfunction, moderate MR, mod-severe TR. HF was been complicated by CKD stage 3. He was also noted on device interrogation to have been in atrial fibrillation persistently since 10/22.  He was admitted at [facility] in 12/22.  BP was low, his BP-active meds were stopped and midodrine was begun.  He was diuresed and discharged home.     He reported ongoing severe dyspnea and was admitted again in 12/22 after he stood up then passed out at home.  Device interrogation showed run of VT treated x 2 with ATP possibly around the time of syncope.  RHC in 12/22 showed low output HF and he developed cardiogenic shock. Impella 5.5 was placed.  On 09/17/21, HM3 LVAD was placed, ICD leads were plastered to the tricuspid valve with severe TR, the valve was not replaced.  The patient had significant interoperative hypotension.  Post-operatively, he developed progressive renal failure and was started on CVVH then iHD.  He had a bumpy post-op course with renal failure and RV failure, but was ultimately able to discharge home on 10/24/21.    Ramp echo in 3/23, RV mildly dilated with normal systolic function, severe TR, LV EF 20-25%, aortic valve opens every beat.  Speed increased to 5300 rpm. The IV septum was mildly leftwards at 5300 rpm but flow increased and AoV still opened every beat.   He is now off HD with creatinine stable, most recently 2.8.    Pt contacted VAD clinic on 3/4 w/  complaints of left groin pain and swelling. Sent for imaging and found to have an abscess. He is being direct admitted for further management and initiation of IV abx. According to Mr. Wixted, pain in his left groin has been progressing over the past 2 weeks now; he now reports it as a 7/10. No fevers, chills, diarrhea, nausea or systemic signs of illness.   Interval hx/subjective:  -Complaining of pain at left groin site this AM.  -Evaluated by general surgery this morning with some drainage expressed.   LVAD INTERROGATION:  HeartMate II LVAD:  Flow 4.9 liters/min, speed 5300, power 3.9, PI 2.6.  1 PI event today. No speed drops or alarms.     Objective:    Vital Signs:   98/59  Physical Exam    General:  Well appearing. No resp difficulty HEENT: Normal Neck: supple. JVP 8-9cm . Carotids 2+ bilat; no bruits. No lymphadenopathy or thyromegaly appreciated. Cor: Mechanical heart sounds with LVAD hum present. Lungs: Clear Abdomen: soft, nontender, nondistended. No hepatosplenomegaly. No bruits or masses. Good bowel sounds. Driveline: C/D/I; securement device intact and driveline incorporated Extremities: no cyanosis, clubbing, rash, edema left groin swollen and extremely tender to palpation Neuro: alert & orientedx3, cranial nerves grossly intact. moves all 4 extremities w/o difficulty. Affect pleasant   Telemetry   afib  EKG   pending  Labs    Basic Metabolic Panel: Recent Labs  Lab  11/27/22 2304 11/28/22 0014 11/29/22 0008  NA 138 137 134*  K 3.9 4.1 4.1  CL 106 103 100  CO2 27 20* 23  GLUCOSE 104* 77 74  BUN 50* 48* 36*  CREATININE 3.00* 3.00* 2.52*  CALCIUM 9.2 9.2 9.0  MG 2.2  --   --      Liver Function Tests: Recent Labs  Lab 11/27/22 2304  AST 29  ALT 31  ALKPHOS 136*  BILITOT 1.1  PROT 7.3  ALBUMIN 3.9    CBC: Recent Labs  Lab 11/27/22 2304 11/28/22 0014 11/29/22 0008  WBC 4.7 4.7 3.7*  NEUTROABS 2.6  --   --   HGB 12.0* 10.9* 11.6*   HCT 37.7* 33.2* 35.7*  MCV 93.1 92.0 92.0  PLT 199 179 206     BNP: BNP (last 3 results) Recent Labs    07/02/22 1642  BNP 501.5*     Coagulation Studies: Recent Labs    11/27/22 2304 11/28/22 1212 11/29/22 0008  LABPROT 29.6* 32.4* 30.1*  INR 2.8* 3.2* 2.9*     Other results: pending  Imaging    No results found.    Patient Profile:   72 y.o. with history of CAD, HTN, atrial fibrillation, chronic systolic CHF s/p HMIII LVAD and CKD IV, direct admitted for treatment of left groin abscess.   Assessment/Plan:    1. Left hemiscrotum abscess - Blood cx pending - CT pelvis w/ 2.2x1.5cm superficial left hemiscrotum abscess; on exam very tender to palpation with diffuse erythema and some induration.  - General surgery following: abscess probed this AM; continue warm compresses. Continue rocephin/vanc. Plan to re-evaluate in AM with possibility of discharge on PO antibiotics afterwards.   - INR 2.9. Discussed with pharmacy. Adjusting dosing.   2. Chronic systolic CHF:  Long-standing cardiomyopathy.  [place].  Echo in 12/22 with EF < 20%, severe LV dilation, restrictive diastolic function, moderate RV dysfunction, moderate MR, mod-severe TR. Cause of cardiomyopathy is uncertain.  He has a history of CAD, but I do not think that the described CAD from the past could explain his cardiomyopathy, but CAD could have progressed.  He was admitted in 12/22 with cardiogenic shock and Impella 5.5. s/p HM III VAD on 09/17/22.  Post-op course complicated by AKI and RV failure. He was on HD for several months but is now off.   - Continue Lasix 60 mg daily.  - Continue sildenafil 20 tid for RV.   - On Warfarin. INR goal 2-2.5.   - INR 2.9, discussed with pharmacy. Adjusting dose. - sCr improving after restarting lasix '60mg'$ .    3. Tricuspid regurgitation:  Tricuspid repair not done at time of VAD due to proximity of ICD wires and hypotension during surgery.  He has  severe TR.   4. Atrial fibrillation:  - Continue amiodarone 200 mg daily  5. CKD stage IV: Suspect intra-op hypotension at the time of LVAD placement led to development of ATN => urine sediment looked like ATN per renal. Had initial CVVH and transitioned over to HD. He is now off HD with creatinine stable, most recently 2.8.   - sCr 2.5 today from 3.   6. CAD: History of PCI to OM1 in 2007 and RCA in 2013.  No CP or ACs.  - Deferred cardiac cath in 12/22 due to AKI and plan for VAD - Continue Crestor.     I reviewed the LVAD parameters from today, and compared the results to the patient's prior  recorded data.  No programming changes were made.  The LVAD is functioning within specified parameters.  The patient performs LVAD self-test daily.  LVAD interrogation was negative for any significant power changes, alarms or PI events/speed drops.  LVAD equipment check completed and is in good working order.  Back-up equipment present.   LVAD education done on emergency procedures and precautions and reviewed exit site care.  Tabitha Tupper Advanced Heart Failure   Advanced Heart Failure Team Pager [phone] (M-F; 7a - 5p)  Please contact [facility] for night-coverage after hours (5p -7a ) and weekends on [email or web address] for all non- LVAD Issues

## 2022-11-29 NOTE — Progress Notes (Signed)
Subjective: CC: Seen with RN. L groin still painful, maybe slightly improved. Drainage overnight with warm compresses.   Objective: Vital signs in last 24 hours: Temp:  [97.6 F (36.4 C)-98.2 F (36.8 C)] 98.2 F (36.8 C) (03/10 0709) Pulse Rate:  [37-67] 67 (03/10 0709) Resp:  [14-19] 16 (03/10 0709) BP: (86-97)/(67-78) 94/76 (03/10 0709) SpO2:  [95 %-100 %] 100 % (03/10 0709) Weight:  [81.6 kg] 81.6 kg (03/10 0500) Last BM Date : 11/26/22  Intake/Output from previous day: 03/09 0701 - 03/10 0700 In: 100 [IV Piggyback:100] Out: [id number] [Urine:875] Intake/Output this shift: Total I/O In: -  Out: 200 [Urine:200]  PE: Chaperone, RN present Gen:  Alert, NAD, pleasant Abd: Soft, ND, NT In the left groin just medial to the groin crease is a superficial area of tenderness, erythema, induration and warmth. There is a punctate area of bloody/purulent drainage here consistent with self draining small subcutaneous abscess. This was able to be probed with sterile cotton swab and express small amount of fibrinous tissue that seemed to be clogging opening. Opening is now ~0.5cm. Appears to be draining well now. I was going to wick opening but patient asked that I hold off. Continue warm compresses.   Lab Results:  Recent Labs    11/28/22 0014 11/29/22 0008  WBC 4.7 3.7*  HGB 10.9* 11.6*  HCT 33.2* 35.7*  PLT 179 206   BMET Recent Labs    11/28/22 0014 11/29/22 0008  NA 137 134*  K 4.1 4.1  CL 103 100  CO2 20* 23  GLUCOSE 77 74  BUN 48* 36*  CREATININE 3.00* 2.52*  CALCIUM 9.2 9.0   PT/INR Recent Labs    11/28/22 1212 11/29/22 0008  LABPROT 32.4* 30.1*  INR 3.2* 2.9*   CMP     Component Value Date/Time   NA 134 (L) 11/29/2022 0008   NA 141 07/06/2014 1425   K 4.1 11/29/2022 0008   K 3.6 07/06/2014 1425   CL 100 11/29/2022 0008   CL 110 (H) 07/06/2014 1425   CO2 23 11/29/2022 0008   CO2 30 07/06/2014 1425   GLUCOSE 74 11/29/2022 0008   GLUCOSE 93  07/06/2014 1425   BUN 36 (H) 11/29/2022 0008   BUN 15 07/06/2014 1425   CREATININE 2.52 (H) 11/29/2022 0008   CREATININE 1.45 (H) 07/06/2014 1425   CALCIUM 9.0 11/29/2022 0008   CALCIUM 8.5 07/06/2014 1425   PROT 7.3 11/27/2022 2304   ALBUMIN 3.9 11/27/2022 2304   AST 29 11/27/2022 2304   ALT 31 11/27/2022 2304   ALKPHOS 136 (H) 11/27/2022 2304   BILITOT 1.1 11/27/2022 2304   GFRNONAA 27 (L) 11/29/2022 0008   GFRNONAA 52 (L) 07/06/2014 1425   GFRAA 57 (L) 03/03/2020 1020   GFRAA >60 07/06/2014 1425   Lipase  No results found for: "LIPASE"  Studies/Results: CT PELVIS WO CONTRAST  Result Date: 11/28/2022 CLINICAL DATA:  Soft tissue infection suspected. LEFT groin abscess. EXAM: CT PELVIS WITHOUT CONTRAST TECHNIQUE: Multidetector CT imaging of the pelvis was performed following the standard protocol without intravenous contrast. RADIATION DOSE REDUCTION: This exam was performed according to the departmental dose-optimization program which includes automated exposure control, adjustment of the mA and/or kV according to patient size and/or use of iterative reconstruction technique. COMPARISON:  09/09/2021 FINDINGS: Urinary Tract: Distal ureters and urinary bladder are normal in appearance. Bowel: There is colonic diverticulosis without acute diverticulitis. Normal appendix. Vascular/Lymphatic: There is dense atherosclerotic calcification of the  abdominal aorta, not associated with aneurysm. There are mildly prominent LEFT inguinal lymph nodes, likely reactive Reproductive: Prostate has a normal appearance. There are small bilateral hydroceles. There is a fat containing LEFT inguinal hernia, with fluid collection in the LEFT mid inguinal canal. A small rim enhancing mass is identified along the LATERAL aspect of the LEFT hemiscrotum, measuring 2.2 x 1.5 centimeters. (Image 46 of series 3). Finding is consistent with small superficial abscess. Other:  No ascites. Musculoskeletal: There are  degenerative changes in the lumbar spine. IMPRESSION: 1. 2.2 x 1.5 centimeter superficial abscess along the LATERAL aspect of the LEFT hemiscrotum. 2. LEFT inguinal hernia contains fat and fluid collection within the mid inguinal canal. 3. Small bilateral hydroceles. 4. Colonic diverticulosis without acute diverticulitis. 5. Degenerative changes in the lumbar spine. 6.  Aortic Atherosclerosis (ICD10-I70.0). Electronically Signed   By: Nolon Nations M.D.   On: 11/28/2022 11:32    Anti-infectives: Anti-infectives (From admission, onward)    Start     Dose/Rate Route Frequency Ordered Stop   11/29/22 1715  vancomycin (VANCOREADY) IVPB 500 mg/100 mL       See Hyperspace for full Linked Orders Report.   500 mg 100 mL/hr over 60 Minutes Intravenous Every 24 hours 11/28/22 1519     11/28/22 1615  vancomycin (VANCOCIN) IVPB 1000 mg/200 mL premix       See Hyperspace for full Linked Orders Report.   1,000 mg 200 mL/hr over 60 Minutes Intravenous  Once 11/28/22 1519 11/28/22 1805   11/28/22 1600  cefTRIAXone (ROCEPHIN) 2 g in sodium chloride 0.9 % 100 mL IVPB        2 g 200 mL/hr over 30 Minutes Intravenous Every 24 hours 11/28/22 1508     11/28/22 1345  cefTRIAXone (ROCEPHIN) 2 g in sodium chloride 0.9 % 100 mL IVPB  Status:  Discontinued        2 g 200 mL/hr over 30 Minutes Intravenous Every 24 hours 11/28/22 1245 11/28/22 1247   11/28/22 1345  ceFAZolin (ANCEF) IVPB 2g/100 mL premix  Status:  Discontinued        2 g 200 mL/hr over 30 Minutes Intravenous Every 12 hours 11/28/22 1247 11/28/22 1325        Assessment/Plan Left groin abscess - CT pelvis with 2.2 x 1.5 abscess along the lateral aspect of the left hemiscrotum  - IR evaluated 3/9 and felt there was no evidence of formed abscess, just inflammation/phlegmon and no role for aspiration at this time.  - Area is spontaneously draining after probing opening this am and expressing area of fibrinous tissue. Patient declined me wicking the  opening. Continue warm compresses and abx. No indication for emergency surgery. We will re-eval in the AM.   [place] for diet.  VTE - SCDs, INR 2.9 ID - Rocephin/Vanc. Afebrile. WBC 3.7  CAD HTN Atrial fibrillation on Coumadin with INR of 2.9 Chronic systolic CHF s/p HMIII LVAD  I reviewed nursing notes, last 24 h vitals and pain scores, last 48 h intake and output, last 24 h labs and trends, and last 24 h imaging results.    LOS: 2 days    Isaac [name] , [facility] 11/29/2022, 7:39 AM Please see Amion for pager number during day hours 7:00am-4:30pm

## 2022-11-29 NOTE — Progress Notes (Addendum)
ANTICOAGULATION CONSULT NOTE - Initial Consult  Pharmacy Consult for warfarin Indication:  LVAD (HM3) and hx of AF  Allergies  Allergen Reactions   Mushroom Extract Complex Nausea And Vomiting   Doxycycline Nausea And Vomiting   Neosporin [Neomycin-Bacitracin Zn-Polymyx] Hives   Tape Other (See Comments)    Some tapes/dressings can irritate the skin    Patient Measurements: Weight: 81.6 kg (179 lb 14.3 oz)  Vital Signs: Temp: 98.2 F (36.8 C) (03/10 0709) Temp Source: Oral (03/10 0709) BP: 94/76 (03/10 0709) Pulse Rate: 67 (03/10 0709)  Labs: Recent Labs    11/27/22 2304 11/28/22 0014 11/28/22 1212 11/29/22 0008  HGB 12.0* 10.9*  --  11.6*  HCT 37.7* 33.2*  --  35.7*  PLT 199 179  --  206  LABPROT 29.6*  --  32.4* 30.1*  INR 2.8*  --  3.2* 2.9*  CREATININE 3.00* 3.00*  --  2.52*     Estimated Creatinine Clearance: 27.3 mL/min (A) (by C-G formula based on SCr of 2.52 mg/dL (H)).   Medical History: Past Medical History:  Diagnosis Date   Arrhythmia    atrial fibrillation   CHF (congestive heart failure) (HCC)    Chronic kidney disease    Coronary artery disease    Hyperlipidemia    Hypertension    Myocardial infarct (HCC)     Medications:  Medications Prior to Admission  Medication Sig Dispense Refill Last Dose   albuterol (VENTOLIN HFA) 108 (90 Base) MCG/ACT inhaler Inhale 2 puffs into the lungs every 6 (six) hours as needed. (Patient taking differently: Inhale 2 puffs into the lungs every 6 (six) hours as needed for wheezing or shortness of breath.) 1 each 3 unk   amiodarone (PACERONE) 200 MG tablet Take 1 tablet (200 mg total) by mouth daily. Take 200 mg twice a day x 7 days, then resume a dose of 200 mg daily. (Patient taking differently: Take 200 mg by mouth daily.) 30 tablet 6 11/27/2022 at 0930   furosemide (LASIX) 40 MG tablet Take 1.5 tablets (60 mg total) by mouth daily. 70 tablet 3 11/27/2022 at am   pantoprazole (PROTONIX) 40 MG tablet TAKE 1 TABLET  BY MOUTH EVERY DAY (Patient taking differently: Take 40 mg by mouth daily before breakfast.) 90 tablet 3 11/27/2022 at am   rosuvastatin (CRESTOR) 10 MG tablet TAKE 1 TABLET BY MOUTH EVERY DAY (Patient taking differently: Take 10 mg by mouth at bedtime.) 90 tablet 3 11/26/2022 at pm   sildenafil (REVATIO) 20 MG tablet TAKE 1 TABLET BY MOUTH THREE TIMES DAILY 270 tablet 2 11/27/2022 at am   warfarin (COUMADIN) 2 MG tablet Take 4 mg (2 tabs) every Monday/Wednesday/Friday and 2 mg (1 tab) all other days or as directed by the [facility]. (Patient taking differently: Take 2-4 mg by mouth See admin instructions. Beginning on 11/27/2022, take 4 mg by mouth once a day on Fri/Sun/Mon/Wed and 2 mg on Sat/Tues. Next INR is on 12/03/2022.) 120 tablet 11 11/25/2022 at 1900   enoxaparin (LOVENOX) 40 MG/0.4ML injection Inject 0.4 mLs (40 mg total) into the skin every 12 (twelve) hours. (Patient not taking: Reported on 11/27/2022) 4 mL 0 Not Taking   potassium chloride SA (KLOR-CON M) 20 MEQ tablet Take 1 tablet (20 mEq total) by mouth daily. (Patient not taking: Reported on 11/27/2022) 30 tablet 6 Not Taking   Scheduled:   amiodarone  200 mg Oral Daily   furosemide  60 mg Oral Daily   pantoprazole  40 mg Oral Daily   rosuvastatin  10 mg Oral QHS   sildenafil  20 mg Oral TID   Infusions:   cefTRIAXone (ROCEPHIN)  IV 2 g (11/28/22 1621)   vancomycin      Assessment: Patient admitted for groin abscess. Warfarin PTA dose of 4 mg all days except 2 mg on Tuesday and Saturday. INR decreased from 3.2 to 2.9 this AM after holding one dose. CBC is stable. LDH ok at 156. No bleeding noted. Discussed with HF MD and given no plans for surgical intervention, will give very low dose warfarin to trend back to goal INR.  Goal of Therapy:  INR 2-2.5  Plan:  Warfarin 0.5 mg tonight Daily INR and CBC  Thank you for involving pharmacy in this patient's care.  Reatha Harps, PharmD PGY2 Pharmacy Resident 11/29/2022 7:34  AM

## 2022-11-30 DIAGNOSIS — I5022 Chronic systolic (congestive) heart failure: Secondary | ICD-10-CM | POA: Diagnosis not present

## 2022-11-30 DIAGNOSIS — L02214 Cutaneous abscess of groin: Secondary | ICD-10-CM | POA: Diagnosis not present

## 2022-11-30 DIAGNOSIS — Z952 Presence of prosthetic heart valve: Secondary | ICD-10-CM | POA: Diagnosis not present

## 2022-11-30 LAB — CBC
HCT: 34.8 % — ABNORMAL LOW (ref 39.0–52.0)
Hemoglobin: 11.5 g/dL — ABNORMAL LOW (ref 13.0–17.0)
MCH: 30.5 pg (ref 26.0–34.0)
MCHC: 33 g/dL (ref 30.0–36.0)
MCV: 92.3 fL (ref 80.0–100.0)
Platelets: 205 10*3/uL (ref 150–400)
RBC: 3.77 MIL/uL — ABNORMAL LOW (ref 4.22–5.81)
RDW: 15.9 % — ABNORMAL HIGH (ref 11.5–15.5)
WBC: 4 10*3/uL (ref 4.0–10.5)
nRBC: 0 % (ref 0.0–0.2)

## 2022-11-30 LAB — BASIC METABOLIC PANEL
Anion gap: 6 (ref 5–15)
BUN: 32 mg/dL — ABNORMAL HIGH (ref 8–23)
CO2: 28 mmol/L (ref 22–32)
Calcium: 8.9 mg/dL (ref 8.9–10.3)
Chloride: 100 mmol/L (ref 98–111)
Creatinine, Ser: 2.57 mg/dL — ABNORMAL HIGH (ref 0.61–1.24)
GFR, Estimated: 26 mL/min — ABNORMAL LOW (ref >60)
Glucose, Bld: 93 mg/dL (ref 70–99)
Potassium: 4.2 mmol/L (ref 3.5–5.1)
Sodium: 134 mmol/L — ABNORMAL LOW (ref 135–145)

## 2022-11-30 LAB — LACTATE DEHYDROGENASE: LDH: 141 U/L (ref 98–192)

## 2022-11-30 LAB — PROTIME-INR
INR: 2.6 — ABNORMAL HIGH (ref 0.8–1.2)
Prothrombin Time: 27.2 seconds — ABNORMAL HIGH (ref 11.4–15.2)

## 2022-11-30 MED ORDER — WARFARIN SODIUM 2 MG PO TABS
2.0000 mg | ORAL_TABLET | Freq: Once | ORAL | Status: AC
  Administered 2022-11-30: 2 mg via ORAL
  Filled 2022-11-30: qty 1

## 2022-11-30 MED ORDER — AMOXICILLIN-POT CLAVULANATE 500-125 MG PO TABS
1.0000 | ORAL_TABLET | Freq: Two times a day (BID) | ORAL | Status: DC
  Filled 2022-11-30 (×2): qty 1

## 2022-11-30 MED ORDER — SODIUM CHLORIDE 0.9 % IV SOLN
2.0000 g | INTRAVENOUS | Status: DC
  Administered 2022-11-30 – 2022-12-03 (×4): 2 g via INTRAVENOUS
  Filled 2022-11-30 (×4): qty 20

## 2022-11-30 MED ORDER — VANCOMYCIN HCL 500 MG/100ML IV SOLN
500.0000 mg | INTRAVENOUS | Status: DC
  Administered 2022-11-30 – 2022-12-01 (×2): 500 mg via INTRAVENOUS
  Filled 2022-11-30 (×2): qty 100

## 2022-11-30 MED ORDER — LINEZOLID 600 MG PO TABS
600.0000 mg | ORAL_TABLET | Freq: Two times a day (BID) | ORAL | Status: DC
  Filled 2022-11-30 (×2): qty 1

## 2022-11-30 NOTE — Progress Notes (Signed)
[place] for warfarin Indication:  LVAD (HM3) and hx of AF  Allergies  Allergen Reactions   Mushroom Extract Complex Nausea And Vomiting   Doxycycline Nausea And Vomiting   Neosporin [Neomycin-Bacitracin Zn-Polymyx] Hives   Tape Other (See Comments)    Some tapes/dressings can irritate the skin    Patient Measurements: Weight: 81.2 kg (179 lb 0.2 oz)  Vital Signs: Temp: 97.6 F (36.4 C) (03/11 1116) Temp Source: Oral (03/11 1116) BP: 95/61 (03/11 1116) Pulse Rate: 70 (03/11 1116)  Labs: Recent Labs    11/28/22 0014 11/28/22 1212 11/29/22 0008 11/30/22 0020  HGB 10.9*  --  11.6* 11.5*  HCT 33.2*  --  35.7* 34.8*  PLT 179  --  206 205  LABPROT  --  32.4* 30.1* 27.2*  INR  --  3.2* 2.9* 2.6*  CREATININE 3.00*  --  2.52* 2.57*     Estimated Creatinine Clearance: 26.7 mL/min (A) (by C-G formula based on SCr of 2.57 mg/dL (H)).   Medical History: Past Medical History:  Diagnosis Date   Arrhythmia    atrial fibrillation   CHF (congestive heart failure) (HCC)    Chronic kidney disease    Coronary artery disease    Hyperlipidemia    Hypertension    Myocardial infarct (HCC)     Medications:  Medications Prior to Admission  Medication Sig Dispense Refill Last Dose   albuterol (VENTOLIN HFA) 108 (90 Base) MCG/ACT inhaler Inhale 2 puffs into the lungs every 6 (six) hours as needed. (Patient taking differently: Inhale 2 puffs into the lungs every 6 (six) hours as needed for wheezing or shortness of breath.) 1 each 3 unk   amiodarone (PACERONE) 200 MG tablet Take 1 tablet (200 mg total) by mouth daily. Take 200 mg twice a day x 7 days, then resume a dose of 200 mg daily. (Patient taking differently: Take 200 mg by mouth daily.) 30 tablet 6 11/27/2022 at 0930   furosemide (LASIX) 40 MG tablet Take 1.5 tablets (60 mg total) by mouth daily. 70 tablet 3 11/27/2022 at am   pantoprazole (PROTONIX) 40 MG tablet TAKE 1 TABLET BY MOUTH EVERY DAY  (Patient taking differently: Take 40 mg by mouth daily before breakfast.) 90 tablet 3 11/27/2022 at am   rosuvastatin (CRESTOR) 10 MG tablet TAKE 1 TABLET BY MOUTH EVERY DAY (Patient taking differently: Take 10 mg by mouth at bedtime.) 90 tablet 3 11/26/2022 at pm   sildenafil (REVATIO) 20 MG tablet TAKE 1 TABLET BY MOUTH THREE TIMES DAILY 270 tablet 2 11/27/2022 at am   warfarin (COUMADIN) 2 MG tablet Take 4 mg (2 tabs) every Monday/Wednesday/Friday and 2 mg (1 tab) all other days or as directed by the [facility]. (Patient taking differently: Take 2-4 mg by mouth See admin instructions. Beginning on 11/27/2022, take 4 mg by mouth once a day on Fri/Sun/Mon/Wed and 2 mg on Sat/Tues. Next INR is on 12/03/2022.) 120 tablet 11 11/25/2022 at 1900   enoxaparin (LOVENOX) 40 MG/0.4ML injection Inject 0.4 mLs (40 mg total) into the skin every 12 (twelve) hours. (Patient not taking: Reported on 11/27/2022) 4 mL 0 Not Taking   potassium chloride SA (KLOR-CON M) 20 MEQ tablet Take 1 tablet (20 mEq total) by mouth daily. (Patient not taking: Reported on 11/27/2022) 30 tablet 6 Not Taking   Scheduled:   amiodarone  200 mg Oral Daily   furosemide  60 mg Oral Daily   pantoprazole  40 mg Oral  Daily   rosuvastatin  10 mg Oral QHS   sildenafil  20 mg Oral TID   Warfarin - Pharmacist Dosing Inpatient   Does not apply q1600   Infusions:   cefTRIAXone (ROCEPHIN)  IV Stopped (11/29/22 1750)   vancomycin 100 mL/hr at 11/29/22 1800    Assessment: 73 yoM with LVAD admitted with groin abscess. Pt on warfarin PTA (dose '4mg'$  daily except '2mg'$  Tues/Thurs/Sat).  INR down to 2.6 today after reduced dose yesterday. CBC and LDH stable.  Goal of Therapy:  INR 2-2.5  Plan:  Warfarin '2mg'$  PO x1 tonight Daily INR  Arrie Senate, PharmD, [place], [facility] Clinical Pharmacist [phone] Please check AMION for all [facility] Pharmacy numbers 11/30/2022

## 2022-11-30 NOTE — Progress Notes (Signed)
Patient ID: Isaac [name], male   DOB: July 14, 1951, 72 y.o.   MRN: [id number]      Subjective: Has had some drainage L groin ROS negative except as listed above. Objective: Vital signs in last 24 hours: Temp:  [97.5 F (36.4 C)-97.8 F (36.6 C)] 97.8 F (36.6 C) (03/11 0300) Pulse Rate:  [32-75] 32 (03/10 2314) Resp:  [14-19] 18 (03/11 0639) BP: (88-98)/(59-72) 91/69 (03/11 0630) SpO2:  [96 %-99 %] 99 % (03/10 2314) Weight:  [81.2 kg] 81.2 kg (03/11 0639) Last BM Date : 11/26/22  Intake/Output from previous day: 03/10 0701 - 03/11 0700 In: 106.3 [IV Piggyback:106.3] Out: 1250 [Urine:1250] Intake/Output this shift: No intake/output data recorded.  L groin indurated area has opening with some drainage, cellulitis improved  Lab Results: CBC  Recent Labs    11/29/22 0008 11/30/22 0020  WBC 3.7* 4.0  HGB 11.6* 11.5*  HCT 35.7* 34.8*  PLT 206 205   BMET Recent Labs    11/29/22 0008 11/30/22 0020  NA 134* 134*  K 4.1 4.2  CL 100 100  CO2 23 28  GLUCOSE 74 93  BUN 36* 32*  CREATININE 2.52* 2.57*  CALCIUM 9.0 8.9   PT/INR Recent Labs    11/29/22 0008 11/30/22 0020  LABPROT 30.1* 27.2*  INR 2.9* 2.6*   ABG No results for input(s): "PHART", "HCO3" in the last 72 hours.  Invalid input(s): "PCO2", "PO2"  Studies/Results: CT PELVIS WO CONTRAST  Result Date: 11/28/2022 CLINICAL DATA:  Soft tissue infection suspected. LEFT groin abscess. EXAM: CT PELVIS WITHOUT CONTRAST TECHNIQUE: Multidetector CT imaging of the pelvis was performed following the standard protocol without intravenous contrast. RADIATION DOSE REDUCTION: This exam was performed according to the departmental dose-optimization program which includes automated exposure control, adjustment of the mA and/or kV according to patient size and/or use of iterative reconstruction technique. COMPARISON:  09/09/2021 FINDINGS: Urinary Tract: Distal ureters and urinary bladder are normal in appearance. Bowel:  There is colonic diverticulosis without acute diverticulitis. Normal appendix. Vascular/Lymphatic: There is dense atherosclerotic calcification of the abdominal aorta, not associated with aneurysm. There are mildly prominent LEFT inguinal lymph nodes, likely reactive Reproductive: Prostate has a normal appearance. There are small bilateral hydroceles. There is a fat containing LEFT inguinal hernia, with fluid collection in the LEFT mid inguinal canal. A small rim enhancing mass is identified along the LATERAL aspect of the LEFT hemiscrotum, measuring 2.2 x 1.5 centimeters. (Image 46 of series 3). Finding is consistent with small superficial abscess. Other:  No ascites. Musculoskeletal: There are degenerative changes in the lumbar spine. IMPRESSION: 1. 2.2 x 1.5 centimeter superficial abscess along the LATERAL aspect of the LEFT hemiscrotum. 2. LEFT inguinal hernia contains fat and fluid collection within the mid inguinal canal. 3. Small bilateral hydroceles. 4. Colonic diverticulosis without acute diverticulitis. 5. Degenerative changes in the lumbar spine. 6.  Aortic Atherosclerosis (ICD10-I70.0). Electronically Signed   By: Nolon Nations M.D.   On: 11/28/2022 11:32    Anti-infectives: Anti-infectives (From admission, onward)    Start     Dose/Rate Route Frequency Ordered Stop   11/29/22 1715  vancomycin (VANCOREADY) IVPB 500 mg/100 mL       See Hyperspace for full Linked Orders Report.   500 mg 100 mL/hr over 60 Minutes Intravenous Every 24 hours 11/28/22 1519     11/28/22 1615  vancomycin (VANCOCIN) IVPB 1000 mg/200 mL premix       See Hyperspace for full Linked Orders Report.   1,000 mg  200 mL/hr over 60 Minutes Intravenous  Once 11/28/22 1519 11/28/22 1805   11/28/22 1600  cefTRIAXone (ROCEPHIN) 2 g in sodium chloride 0.9 % 100 mL IVPB        2 g 200 mL/hr over 30 Minutes Intravenous Every 24 hours 11/28/22 1508     11/28/22 1345  cefTRIAXone (ROCEPHIN) 2 g in sodium chloride 0.9 % 100 mL  IVPB  Status:  Discontinued        2 g 200 mL/hr over 30 Minutes Intravenous Every 24 hours 11/28/22 1245 11/28/22 1247   11/28/22 1345  ceFAZolin (ANCEF) IVPB 2g/100 mL premix  Status:  Discontinued        2 g 200 mL/hr over 30 Minutes Intravenous Every 12 hours 11/28/22 1247 11/28/22 1325       Assessment/Plan: Left groin abscess - CT pelvis with 2.2 x 1.5 abscess along the lateral aspect of the left hemiscrotum  - IR evaluated 3/9 and felt there was no evidence of formed abscess, just inflammation/phlegmon and no role for aspiration at this time.  - Area is spontaneously draining and cellulitis has improved a lot. Afeb and WBC WNL. No need for surgical drainage at this time. OK to switch to PO ABX. We will follow.  [place] for diet.  VTE - SCDs, INR 2.6 ID - Rocephin/Vanc  CAD HTN Atrial fibrillation on Coumadin with INR of 2.9 Chronic systolic CHF s/p HMIII LVAD    LOS: 3 days    Georganna Skeans, MD, MPH, FACS Trauma & General Surgery Use [email or web address] to contact on call provider  11/30/2022

## 2022-11-30 NOTE — Progress Notes (Cosign Needed)
  Transition of Care [facility]) Screening Note   Patient Details  Name: Isaac [name] Date of Birth: 1951/08/05   Transition [facility]) CM/SW Contact:    Cyndi Bender, RN Phone Number: 11/30/2022, 1:22 PM    Transition of Care Department [facility]) has reviewed patient and no TOC needs have been identified at this time. We will continue to monitor patient advancement through interdisciplinary progression rounds. If new patient transition needs arise, please place a TOC consult.

## 2022-11-30 NOTE — Progress Notes (Signed)
LVAD Coordinator Rounding Note:  Admitted 11/28/22 to Heart Failure service for left groin abscess. Pt was seen by general surgery at [facility] and informed that he may need surgery. Direct admitted pt Friday evening for further testing per [facility] here in [place].  HM III LVAD implanted on 09/17/21 by Dr Cyndia Bent under destination therapy criteria. 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   Pt seen with Dr Prescott Gum this morning. Left groin assessed, pt is writhing in pain with assessment of left groin. DR Darcey Nora would like pt to remain on IV antibiotics until Friday to ensure that infection is clearing and redness/pain is better prior to switching to PO antibiotics. D/w Dr Aundra Dubin.  Vital signs: Temp: 97.6 HR: 70 A paced Doppler Pressure: 74 Auto Cuff BP:  95/61 (71) O2 Sat: 97% on RA Wt: 179 lbs    LVAD interrogation reveals:  Speed: 5300 Flow: 4.5 Power: 3.9w PI:  3.5 Hematocrit:34  Alarms: none Events: 8 today; most days rare Fixed speed: 5300 Low speed limit: 5000  Drive Line:  Existing VAD dressing C/D/I with anchor intact and accurately applied. Weekly dressing changes per VAD coordinator, or bedside nurse. Next dressing change due: 12/04/22.   Labs:  LDH trend: 141  INR trend: 2.6  Anticoagulation Plan: -INR Goal: 2.0 - 2.5 -ASA Dose: 81 mg daily   Device: - [employer] dual - AV paced 60 -Therapies: on  Infection:  11/29/22>> blood cultures >>pending  Plan/Recommendations:  Page VAD coordinator for drive line or equipment concerns 2. Weekly dressing changes.  [name] RN Lydia Coordinator  Office: [phone]  24/7 Pager: [phone]

## 2022-11-30 NOTE — Progress Notes (Addendum)
Advanced Heart Failure VAD Rounding Note   PCP-Cardiologist: Dr. Aundra Dubin   Reason for Admission: Left Groin Abscess   HPI/Interval hx:    Pain improving with pain meds and warm compress. Denies CP/SOB.   Evaluated by general surgery this morning, area continues to spontaneously drain and cellulitis appears to be improving.   LVAD INTERROGATION:  HeartMate II LVAD:  Flow 4.7 liters/min, speed 5300, power 4, PI 3.1.  No PI events today  Objective:    Vitals:   11/30/22 0639 11/30/22 0800  BP:  (!) 84/73  Pulse:  75  Resp: 18 20  Temp:    SpO2:  96%   VAD MAP 80s  Physical Exam    General:  Well appearing. No resp difficulty HEENT: Normal Neck: supple. JVP ~9 with v waves. Carotids 2+ bilat; no bruits. No lymphadenopathy or thyromegaly appreciated. Cor: Mechanical heart sounds with LVAD hum present. Lungs: Clear Abdomen: soft, nontender, nondistended. No hepatosplenomegaly. No bruits or masses. Good bowel sounds. Driveline: C/D/I; securement device intact and driveline incorporated Extremities: no cyanosis, clubbing, rash, edema. L groin swelling, minimal drainage seen. Lanced area to bottom left of swelling Neuro: alert & orientedx3, cranial nerves grossly intact. moves all 4 extremities w/o difficulty. Affect pleasant   Telemetry   A fib 70s (Personally reviewed)    EKG   No new EKG to review  Labs    Basic Metabolic Panel: Recent Labs  Lab 11/27/22 2304 11/28/22 0014 11/29/22 0008 11/30/22 0020  NA 138 137 134* 134*  K 3.9 4.1 4.1 4.2  CL 106 103 100 100  CO2 27 20* 23 28  GLUCOSE 104* 77 74 93  BUN 50* 48* 36* 32*  CREATININE 3.00* 3.00* 2.52* 2.57*  CALCIUM 9.2 9.2 9.0 8.9  MG 2.2  --   --   --    Liver Function Tests: Recent Labs  Lab 11/27/22 2304  AST 29  ALT 31  ALKPHOS 136*  BILITOT 1.1  PROT 7.3  ALBUMIN 3.9   CBC: Recent Labs  Lab 11/27/22 2304 11/28/22 0014 11/29/22 0008 11/30/22 0020  WBC 4.7 4.7 3.7* 4.0  NEUTROABS  2.6  --   --   --   HGB 12.0* 10.9* 11.6* 11.5*  HCT 37.7* 33.2* 35.7* 34.8*  MCV 93.1 92.0 92.0 92.3  PLT 199 179 206 205   BNP: BNP (last 3 results) Recent Labs    07/02/22 1642  BNP 501.5*   Coagulation Studies: Recent Labs    11/27/22 2304 11/28/22 1212 11/29/22 0008 11/30/22 0020  LABPROT 29.6* 32.4* 30.1* 27.2*  INR 2.8* 3.2* 2.9* 2.6*   Other results: pending  Imaging    No results found.  Patient Profile:   72 y.o. with history of CAD, HTN, atrial fibrillation, chronic systolic CHF s/p HMIII LVAD and CKD IV, direct admitted for treatment of left groin abscess.   Assessment/Plan:   1. Left hemiscrotum abscess - Blood cx pending - CT pelvis w/ 2.2x1.5cm superficial left hemiscrotum abscess; on exam very tender to palpation with diffuse erythema and some induration.  - IR evaluated 3/9 and felt there was no evidence of formed abscess, just inflammation/phlegmon and no role for aspiration at this time.  - General surgery following: abscess probed this AM; continue warm compresses. Continue rocephin/vanc. General surgery evaluated this morning, no need for surgical drainage, ok for PO abx. Area spontaneously draining and cellulitis improving.  - INR 2.6. Discussed with pharmacy. Adjusting dosing.   2. Chronic systolic CHF:  Long-standing cardiomyopathy.  [place].  Echo in 12/22 with EF < 20%, severe LV dilation, restrictive diastolic function, moderate RV dysfunction, moderate MR, mod-severe TR. Cause of cardiomyopathy is uncertain.  He has a history of CAD, but I do not think that the described CAD from the past could explain his cardiomyopathy, but CAD could have progressed.  He was admitted in 12/22 with cardiogenic shock and Impella 5.5. s/p HM III VAD on 09/17/22.  Post-op course complicated by AKI and RV failure. He was on HD for several months but is now off.   - Continue Lasix 60 mg daily.  - Continue sildenafil 20 tid for RV.   - On Warfarin. INR  goal 2-2.5.   - INR 2.6, discussed with pharmacy. Adjusting dose. - sCr improving after restarting lasix '60mg'$  PO daily.    3. Tricuspid regurgitation:  Tricuspid repair not done at time of VAD due to proximity of ICD wires and hypotension during surgery.  He has severe TR.   4. Atrial fibrillation:  - Continue amiodarone 200 mg daily  5. CKD stage IV: Suspect intra-op hypotension at the time of LVAD placement led to development of ATN => urine sediment looked like ATN per renal. Had initial CVVH and transitioned over to HD. He is now off HD with creatinine stable, most recently 2.8.   - sCr 2.5 today from 3.   6. CAD: History of PCI to OM1 in 2007 and RCA in 2013.  No CP or ACs.  - Deferred cardiac cath in 12/22 due to AKI and plan for VAD - Continue Crestor.    I reviewed the LVAD parameters from today, and compared the results to the patient's prior recorded data.  No programming changes were made.  The LVAD is functioning within specified parameters.  The patient performs LVAD self-test daily.  LVAD interrogation was negative for any significant power changes, alarms or PI events/speed drops.  LVAD equipment check completed and is in good working order.  Back-up equipment present.   LVAD education done on emergency procedures and precautions and reviewed exit site care.  Earnie Larsson AGACNP-BC   Advanced Heart Failure Team Pager [phone] (M-F; 7a - 5p)  Please contact [facility] for night-coverage after hours (5p -7a ) and weekends on [email or web address] for all non- LVAD Issues   Patient seen with NP, agree with the above note.   Left groin abscess evaluated by surgery and IR, not enough fluid to drain percutaneously.  He has had some spontaneous drainage and the surrounding cellulitis is improved but still present.  Still with some pain.   Creatinine near his baseline at 2.5.  Afebrile, normal WBCs.    General: Well appearing this am. NAD.  HEENT: Normal. Neck: Supple, JVP 8 cm.  Carotids OK.  Cardiac:  Mechanical heart sounds with LVAD hum present.  Lungs:  CTAB, normal effort.  Abdomen:  NT, ND, no HSM. No bruits or masses. +BS  LVAD exit site: Well-healed and incorporated. Dressing dry and intact. No erythema or drainage. Stabilization device present and accurately applied. Driveline dressing changed daily per sterile technique. Extremities:  Warm and dry. No cyanosis, clubbing, rash, or edema. Left groin cellulitis/small abscess with drainage.  Neuro:  Alert & oriented x 3. Cranial nerves grossly intact. Moves all 4 extremities w/o difficulty. Affect pleasant    Left groin abscess/cellulitis is spontaneously draining and improving but still with significant redness and pain.  Discussion with Dr Prescott Gum and LVAD coordinator today,  think we will continue vancomycin and ceftriaxone a bit longer based on his LVAD until Friday then make switch to linezolid and Augmentin.   Volume status stable back on po Lasix.    Loralie Champagne 11/30/2022 12:21 PM

## 2022-11-30 NOTE — Progress Notes (Signed)
PHARMACY NOTE:  ANTIMICROBIAL RENAL DOSAGE ADJUSTMENT  Current antimicrobial regimen includes a mismatch between antimicrobial dosage and estimated renal function.  As per policy approved by the Pharmacy & Therapeutics and Medical Executive Committees, the antimicrobial dosage will be adjusted accordingly.  Current antimicrobial dosage:  Augmentin '875mg'$  PO q12h  Indication: groin abscess  Renal Function:  Estimated Creatinine Clearance: 26.7 mL/min (A) (by C-G formula based on SCr of 2.57 mg/dL (H)). '[]'$      On intermittent HD, scheduled: '[]'$      On CRRT    Antimicrobial dosage has been changed to:  Augmentin 500 mg PO q12h  Additional comments:   Chae Oommen A. Levada Dy, PharmD, BCPS, FNKF Clinical Pharmacist  [place] Please utilize Amion for appropriate phone number to reach the unit pharmacist ([place])  11/30/2022 12:35 PM

## 2022-12-01 DIAGNOSIS — Z952 Presence of prosthetic heart valve: Secondary | ICD-10-CM | POA: Diagnosis not present

## 2022-12-01 DIAGNOSIS — L02214 Cutaneous abscess of groin: Secondary | ICD-10-CM | POA: Diagnosis not present

## 2022-12-01 DIAGNOSIS — I5022 Chronic systolic (congestive) heart failure: Secondary | ICD-10-CM | POA: Diagnosis not present

## 2022-12-01 LAB — PROTIME-INR
INR: 2.3 — ABNORMAL HIGH (ref 0.8–1.2)
Prothrombin Time: 25.4 seconds — ABNORMAL HIGH (ref 11.4–15.2)

## 2022-12-01 LAB — BASIC METABOLIC PANEL
Anion gap: 13 (ref 5–15)
BUN: 29 mg/dL — ABNORMAL HIGH (ref 8–23)
CO2: 25 mmol/L (ref 22–32)
Calcium: 9.1 mg/dL (ref 8.9–10.3)
Chloride: 98 mmol/L (ref 98–111)
Creatinine, Ser: 2.53 mg/dL — ABNORMAL HIGH (ref 0.61–1.24)
GFR, Estimated: 27 mL/min — ABNORMAL LOW (ref >60)
Glucose, Bld: 116 mg/dL — ABNORMAL HIGH (ref 70–99)
Potassium: 4.4 mmol/L (ref 3.5–5.1)
Sodium: 136 mmol/L (ref 135–145)

## 2022-12-01 LAB — CBC
HCT: 36.8 % — ABNORMAL LOW (ref 39.0–52.0)
Hemoglobin: 12.1 g/dL — ABNORMAL LOW (ref 13.0–17.0)
MCH: 30.6 pg (ref 26.0–34.0)
MCHC: 32.9 g/dL (ref 30.0–36.0)
MCV: 93.2 fL (ref 80.0–100.0)
Platelets: 228 10*3/uL (ref 150–400)
RBC: 3.95 MIL/uL — ABNORMAL LOW (ref 4.22–5.81)
RDW: 15.7 % — ABNORMAL HIGH (ref 11.5–15.5)
WBC: 4.4 10*3/uL (ref 4.0–10.5)
nRBC: 0 % (ref 0.0–0.2)

## 2022-12-01 LAB — LACTATE DEHYDROGENASE: LDH: 148 U/L (ref 98–192)

## 2022-12-01 LAB — VANCOMYCIN, TROUGH: Vancomycin Tr: 10 ug/mL — ABNORMAL LOW (ref 15–20)

## 2022-12-01 MED ORDER — VANCOMYCIN HCL 750 MG/150ML IV SOLN
750.0000 mg | INTRAVENOUS | Status: DC
  Administered 2022-12-02 – 2022-12-03 (×2): 750 mg via INTRAVENOUS
  Filled 2022-12-01 (×3): qty 150

## 2022-12-01 MED ORDER — WARFARIN SODIUM 2 MG PO TABS
2.0000 mg | ORAL_TABLET | Freq: Once | ORAL | Status: AC
  Administered 2022-12-01: 2 mg via ORAL
  Filled 2022-12-01: qty 1

## 2022-12-01 NOTE — Progress Notes (Addendum)
Advanced Heart Failure VAD Rounding Note   PCP-Cardiologist: Dr. Aundra Dubin   Reason for Admission: Left Groin Abscess   HPI/Interval hx:    Pain improving with pain meds and warm compress. Swelling going down. Denies CP/SOB.   Plan to continue IV abx through Friday, then switch to PO linezolid and augmentin.  LVAD INTERROGATION:  HeartMate II LVAD:  Flow 4.7 liters/min, speed 5300, power 4, PI 3.1.  No PI events today  Objective:    Vitals:   12/01/22 0429 12/01/22 0737  BP:  91/68  Pulse: 80 83  Resp:  17  Temp: 98.1 F (36.7 C) 97.7 F (36.5 C)  SpO2: 98% 96%   VAD MAP 70s-80s  Physical Exam    General:  Well appearing. No resp difficulty HEENT: Normal Neck: supple. JVP ~7. Carotids 2+ bilat; no bruits. No lymphadenopathy or thyromegaly appreciated. Cor: Mechanical heart sounds with LVAD hum present. Lungs: Clear Abdomen: soft, nontender, nondistended. No hepatosplenomegaly. No bruits or masses. Good bowel sounds. Driveline: C/D/I; securement device intact and driveline incorporated Extremities: no cyanosis, clubbing, rash, edema. L groin abscess, swelling decreased Neuro: alert & orientedx3, cranial nerves grossly intact. moves all 4 extremities w/o difficulty. Affect pleasant   Telemetry   A fib 60s (Personally reviewed)    EKG   No new EKG to review  Labs    Basic Metabolic Panel: Recent Labs  Lab 11/27/22 2304 11/28/22 0014 11/29/22 0008 11/30/22 0020 12/01/22 0017  NA 138 137 134* 134* 136  K 3.9 4.1 4.1 4.2 4.4  CL 106 103 100 100 98  CO2 27 20* '23 28 25  '$ GLUCOSE 104* 77 74 93 116*  BUN 50* 48* 36* 32* 29*  CREATININE 3.00* 3.00* 2.52* 2.57* 2.53*  CALCIUM 9.2 9.2 9.0 8.9 9.1  MG 2.2  --   --   --   --    Liver Function Tests: Recent Labs  Lab 11/27/22 2304  AST 29  ALT 31  ALKPHOS 136*  BILITOT 1.1  PROT 7.3  ALBUMIN 3.9   CBC: Recent Labs  Lab 11/27/22 2304 11/28/22 0014 11/29/22 0008 11/30/22 0020 12/01/22 0017  WBC  4.7 4.7 3.7* 4.0 4.4  NEUTROABS 2.6  --   --   --   --   HGB 12.0* 10.9* 11.6* 11.5* 12.1*  HCT 37.7* 33.2* 35.7* 34.8* 36.8*  MCV 93.1 92.0 92.0 92.3 93.2  PLT 199 179 206 205 228   BNP: BNP (last 3 results) Recent Labs    07/02/22 1642  BNP 501.5*   Coagulation Studies: Recent Labs    11/28/22 1212 11/29/22 0008 11/30/22 0020 12/01/22 0017  LABPROT 32.4* 30.1* 27.2* 25.4*  INR 3.2* 2.9* 2.6* 2.3*   Other results: pending  Imaging    No results found.  Patient Profile:   72 y.o. with history of CAD, HTN, atrial fibrillation, chronic systolic CHF s/p HMIII LVAD and CKD IV, direct admitted for treatment of left groin abscess.   Assessment/Plan:   1. Left hemiscrotum abscess - Blood cx pending - CT pelvis w/ 2.2x1.5cm superficial left hemiscrotum abscess; on exam very tender to palpation with diffuse erythema and some induration.  - IR evaluated 3/9 and felt there was no evidence of formed abscess, just inflammation/phlegmon and no role for aspiration at this time.  - General surgery following (signed off 3/12): abscess probed; continue warm compresses. Continue rocephin/vanc. General surgery evaluated, no need for surgical drainage, ok for PO abx. Area spontaneously draining and cellulitis improving. Plan  for IV abx until Friday then will switch to PO - INR 2.6. Discussed with pharmacy. Adjusting dosing.   2. Chronic systolic CHF:  Long-standing cardiomyopathy.  [place].  Echo in 12/22 with EF < 20%, severe LV dilation, restrictive diastolic function, moderate RV dysfunction, moderate MR, mod-severe TR. Cause of cardiomyopathy is uncertain.  He has a history of CAD, but I do not think that the described CAD from the past could explain his cardiomyopathy, but CAD could have progressed.  He was admitted in 12/22 with cardiogenic shock and Impella 5.5. s/p HM III VAD on 09/17/22.  Post-op course complicated by AKI and RV failure. He was on HD for several months  but is now off.   - Continue Lasix 60 mg daily.  - Continue sildenafil 20 tid for RV.   - On Warfarin. INR goal 2-2.5.   - INR 2.3 - sCr improving after restarting lasix '60mg'$  PO daily.    3. Tricuspid regurgitation:  Tricuspid repair not done at time of VAD due to proximity of ICD wires and hypotension during surgery.  He has severe TR.   4. Atrial fibrillation:  - Continue amiodarone 200 mg daily  5. CKD stage IV: Suspect intra-op hypotension at the time of LVAD placement led to development of ATN => urine sediment looked like ATN per renal. Had initial CVVH and transitioned over to HD. He is now off HD with creatinine stable, most recently 2.8.   - sCr 2.5 today from 3.   6. CAD: History of PCI to OM1 in 2007 and RCA in 2013.  No CP or ACs.  - Deferred cardiac cath in 12/22 due to AKI and plan for VAD - Continue Crestor.    I reviewed the LVAD parameters from today, and compared the results to the patient's prior recorded data.  No programming changes were made.  The LVAD is functioning within specified parameters.  The patient performs LVAD self-test daily.  LVAD interrogation was negative for any significant power changes, alarms or PI events/speed drops.  LVAD equipment check completed and is in good working order.  Back-up equipment present.   LVAD education done on emergency procedures and precautions and reviewed exit site care.  Isaac [name] AGACNP-BC   Advanced Heart Failure Team Pager [phone] (M-F; 7a - 5p)  Please contact [facility] for night-coverage after hours (5p -7a ) and weekends on [email or web address] for all non- LVAD Issues   Patient seen with NP, agree with the above note.   No complaints today, MAP 70s-80s.   Creatinine stable 2.53.   General: Well appearing this am. NAD.  HEENT: Normal. Neck: Supple, JVP 7-8 cm. Carotids OK.  Cardiac:  Mechanical heart sounds with LVAD hum present.  Lungs:  CTAB, normal effort.  Abdomen:  NT, ND, no HSM. No bruits or masses. +BS   LVAD exit site: Well-healed and incorporated. Dressing dry and intact. No erythema or drainage. Stabilization device present and accurately applied. Driveline dressing changed daily per sterile technique. Extremities:  Warm and dry. No cyanosis, clubbing, rash, or edema.  Neuro:  Alert & oriented x 3. Cranial nerves grossly intact. Moves all 4 extremities w/o difficulty. Affect pleasant    Left groin abscess/cellulitis is spontaneously draining and improving but still with some redness and pain. We will continue IV vancomycin and ceftriaxone a bit longer based on his LVAD until Friday then make switch to linezolid and Augmentin.    Volume status stable back on po Lasix. Creatinine  at baseline.   Isaac [name] 12/01/2022 2:53 PM

## 2022-12-01 NOTE — Progress Notes (Addendum)
  Subjective: Patient examined and pelvic CT scan images reviewed Scrotal dermal abscess much less painful today, drainage better, induration better Cont iv antibiotics ( vanc, ceftriaxone) until 3-15 ( 7 days)  then DC on po Augmentin, linezolid Objective: Vital signs in last 24 hours: Temp:  [97.6 F (36.4 C)-98.1 F (36.7 C)] 97.7 F (36.5 C) (03/12 0737) Pulse Rate:  [62-83] 83 (03/12 0737) Cardiac Rhythm: Normal sinus rhythm;Heart block;Bundle branch block (03/11 1900) Resp:  [14-18] 17 (03/12 0737) BP: (86-115)/(61-83) 97/68 (03/12 0800) SpO2:  [92 %-98 %] 96 % (03/12 0737) Weight:  [81 kg] 81 kg (03/12 0429)  Hemodynamic parameters for last 24 hours:  Nsr, afebrile  Intake/Output from previous day: 03/11 0701 - 03/12 0700 In: 107.2 [IV Piggyback:107.2] Out: 1600 [Urine:1600] Intake/Output this shift: Total I/O In: 240 [P.O.:240] Out: -        Exam    General- alert and comfortable    Neck- no JVD, no cervical adenopathy palpable, no carotid bruit   Lungs- clear without rales, wheezes   Cor- regular rate and rhythm, normal VAD hum   Abdomen- soft, non-tender   Extremities - warm, non-tender, minimal edema, Minimal induration L groin abscess, scant drainage   Neuro- oriented, appropriate, no focal weakness   Lab Results: Recent Labs    11/30/22 0020 12/01/22 0017  WBC 4.0 4.4  HGB 11.5* 12.1*  HCT 34.8* 36.8*  PLT 205 228   BMET:  Recent Labs    11/30/22 0020 12/01/22 0017  NA 134* 136  K 4.2 4.4  CL 100 98  CO2 28 25  GLUCOSE 93 116*  BUN 32* 29*  CREATININE 2.57* 2.53*  CALCIUM 8.9 9.1    PT/INR:  Recent Labs    12/01/22 0017  LABPROT 25.4*  INR 2.3*   ABG    Component Value Date/Time   PHART 7.532 (H) 09/25/2021 0340   HCO3 27.8 09/25/2021 0340   TCO2 29 09/25/2021 0340   ACIDBASEDEF 2.0 09/23/2021 0355   O2SAT 86.5 10/22/2021 0515   CBG (last 3)  No results for input(s): "GLUCAP" in the last 72  hours.  Assessment/Plan: S/P  L groin abscess in VAD patient DC on po Augmentin , linezolid Fri   LOS: 4 days    Dahlia Byes 12/01/2022

## 2022-12-01 NOTE — Care Management Important Message (Signed)
Important Message  Patient Details  Name: Isaac [name] MRN: [id number] Date of Birth: Jun 16, 1951   Medicare Important Message Given:  Yes     Iline Buchinger Montine Circle 12/01/2022, 10:48 AM

## 2022-12-01 NOTE — Plan of Care (Signed)
  Problem: Cardiac: Goal: LVAD will function as expected and patient will experience no clinical alarms Outcome: Progressing   Problem: Education: Goal: Knowledge of General Education information will improve Description: Including pain rating scale, medication(s)/side effects and non-pharmacologic comfort measures Outcome: Progressing   Problem: Health Behavior/Discharge Planning: Goal: Ability to manage health-related needs will improve Outcome: Progressing   Problem: Clinical Measurements: Goal: Cardiovascular complication will be avoided Outcome: Progressing   Problem: Pain Managment: Goal: General experience of comfort will improve Outcome: Progressing   Problem: Skin Integrity: Goal: Risk for impaired skin integrity will decrease Outcome: Progressing

## 2022-12-01 NOTE — Progress Notes (Signed)
LVAD Coordinator Rounding Note:  Admitted 11/28/22 to Heart Failure service for left groin abscess. Pt was seen by general surgery at [facility] and informed that he may need surgery. Direct admitted pt Friday evening for further testing per [facility] Surgery here in [place].  HM III LVAD implanted on 09/17/21 by Dr Cyndia Bent under destination therapy criteria. 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   Left groin assessed, pain and redness better today. Pt tells me he was able to express a small "stone" from area last night. DR Darcey Nora would like pt to remain on IV antibiotics until Friday to ensure that infection is clearing and redness/pain is better prior to switching to PO antibiotics.  Pt up walking in the halls.  Vital signs: Temp: 97.7 HR: 83 A paced Doppler Pressure: 82 Auto Cuff BP:  97/68 (77) O2 Sat: 96% on RA Wt: 179>178.5 lbs    LVAD interrogation reveals:  Speed: 5300 Flow: 4.6 Power: 3.9w PI:  3.1 Hematocrit:37  Alarms: none Events: 3 today  Fixed speed: 5300 Low speed limit: 5000  Drive Line:  Existing VAD dressing C/D/I with anchor intact and accurately applied. Weekly dressing changes per VAD coordinator, or bedside nurse. Next dressing change due: 12/04/22.   Labs:  LDH trend: 141>148  INR trend: 2.6>2.3  Anticoagulation Plan: -INR Goal: 2.0 - 2.5   Device: - [employer] dual - AV paced 60 -Therapies: on  Infection:  11/29/22>> blood cultures >>no growth 2 days  Plan/Recommendations:  Page VAD coordinator for drive line or equipment concerns 2. Weekly dressing changes.  [name] RN [place] Coordinator  Office: [phone]  24/7 Pager: [phone]

## 2022-12-01 NOTE — Progress Notes (Signed)
[place] for warfarin Indication:  LVAD (HM3) and hx of AF  Allergies  Allergen Reactions   Mushroom Extract Complex Nausea And Vomiting   Doxycycline Nausea And Vomiting   Neosporin [Neomycin-Bacitracin Zn-Polymyx] Hives   Tape Other (See Comments)    Some tapes/dressings can irritate the skin    Patient Measurements: Weight: 81 kg (178 lb 9.2 oz)  Vital Signs: Temp: 98.1 F (36.7 C) (03/12 0429) Temp Source: Oral (03/12 0429) BP: 86/78 (03/11 2332) Pulse Rate: 80 (03/12 0429)  Labs: Recent Labs    11/29/22 0008 11/30/22 0020 12/01/22 0017  HGB 11.6* 11.5* 12.1*  HCT 35.7* 34.8* 36.8*  PLT 206 205 228  LABPROT 30.1* 27.2* 25.4*  INR 2.9* 2.6* 2.3*  CREATININE 2.52* 2.57* 2.53*     Estimated Creatinine Clearance: 27.2 mL/min (A) (by C-G formula based on SCr of 2.53 mg/dL (H)).   Medical History: Past Medical History:  Diagnosis Date   Arrhythmia    atrial fibrillation   CHF (congestive heart failure) (HCC)    Chronic kidney disease    Coronary artery disease    Hyperlipidemia    Hypertension    Myocardial infarct (HCC)     Medications:  Medications Prior to Admission  Medication Sig Dispense Refill Last Dose   albuterol (VENTOLIN HFA) 108 (90 Base) MCG/ACT inhaler Inhale 2 puffs into the lungs every 6 (six) hours as needed. (Patient taking differently: Inhale 2 puffs into the lungs every 6 (six) hours as needed for wheezing or shortness of breath.) 1 each 3 unk   amiodarone (PACERONE) 200 MG tablet Take 1 tablet (200 mg total) by mouth daily. Take 200 mg twice a day x 7 days, then resume a dose of 200 mg daily. (Patient taking differently: Take 200 mg by mouth daily.) 30 tablet 6 11/27/2022 at 0930   furosemide (LASIX) 40 MG tablet Take 1.5 tablets (60 mg total) by mouth daily. 70 tablet 3 11/27/2022 at am   pantoprazole (PROTONIX) 40 MG tablet TAKE 1 TABLET BY MOUTH EVERY DAY (Patient taking differently: Take 40 mg by  mouth daily before breakfast.) 90 tablet 3 11/27/2022 at am   rosuvastatin (CRESTOR) 10 MG tablet TAKE 1 TABLET BY MOUTH EVERY DAY (Patient taking differently: Take 10 mg by mouth at bedtime.) 90 tablet 3 11/26/2022 at pm   sildenafil (REVATIO) 20 MG tablet TAKE 1 TABLET BY MOUTH THREE TIMES DAILY 270 tablet 2 11/27/2022 at am   warfarin (COUMADIN) 2 MG tablet Take 4 mg (2 tabs) every Monday/Wednesday/Friday and 2 mg (1 tab) all other days or as directed by the [facility]. (Patient taking differently: Take 2-4 mg by mouth See admin instructions. Beginning on 11/27/2022, take 4 mg by mouth once a day on Fri/Sun/Mon/Wed and 2 mg on Sat/Tues. Next INR is on 12/03/2022.) 120 tablet 11 11/25/2022 at 1900   enoxaparin (LOVENOX) 40 MG/0.4ML injection Inject 0.4 mLs (40 mg total) into the skin every 12 (twelve) hours. (Patient not taking: Reported on 11/27/2022) 4 mL 0 Not Taking   potassium chloride SA (KLOR-CON M) 20 MEQ tablet Take 1 tablet (20 mEq total) by mouth daily. (Patient not taking: Reported on 11/27/2022) 30 tablet 6 Not Taking   Scheduled:   amiodarone  200 mg Oral Daily   furosemide  60 mg Oral Daily   pantoprazole  40 mg Oral Daily   rosuvastatin  10 mg Oral QHS   sildenafil  20 mg Oral TID   Warfarin -  Pharmacist Dosing Inpatient   Does not apply q1600   Infusions:   cefTRIAXone (ROCEPHIN)  IV 2 g (11/30/22 1557)   vancomycin 500 mg (11/30/22 1653)    Assessment: 30 yoM with LVAD admitted with groin abscess. Pt on warfarin PTA (dose '4mg'$  daily except '2mg'$  Tues/Thurs/Sat).  INR down to 2.3 today and therapeutic. CBC and LDH stable.  Goal of Therapy:  INR 2-2.5  Plan:  Warfarin '2mg'$  PO x1 again tonight - will try to resume home dose Daily INR  Arrie Senate, PharmD, BCPS, [facility] Clinical Pharmacist [phone] Please check AMION for all [place] numbers 12/01/2022

## 2022-12-01 NOTE — TOC Progression Note (Signed)
Transition of Care [facility]) - Progression Note    Patient Details  Name: Isaac [name] MRN: [id number] Date of Birth: Jun 19, 1951  Transition of Care [facility]) CM/SW [place], RN Phone Number: 12/01/2022, 1:33 PM  Clinical Narrative:      DR Darcey Nora would like pt to remain on IV antibiotics until Friday to ensure that infection is clearing and redness/pain is better prior to switching to PO antibiotics.  TOC following.       Expected Discharge Plan and Services                                               Social Determinants of Health (SDOH) Interventions SDOH Screenings   Depression (PHQ2-9): Low Risk  (04/14/2022)  Tobacco Use: Medium Risk (10/27/2022)    Readmission Risk Interventions     No data to display

## 2022-12-01 NOTE — Progress Notes (Signed)
Subjective: Seen with RN. L groin continues to drain some with warm compresses. Pain still present but definitely improved from yesterday. Patient reports redness and swelling going down as well.   Objective: Vital signs in last 24 hours: Temp:  [97.6 F (36.4 C)-98.1 F (36.7 C)] 97.7 F (36.5 C) (03/12 0737) Pulse Rate:  [62-83] 83 (03/12 0737) Resp:  [14-18] 17 (03/12 0737) BP: (86-115)/(61-83) 97/68 (03/12 0800) SpO2:  [92 %-98 %] 96 % (03/12 0737) Weight:  [81 kg] 81 kg (03/12 0429) Last BM Date : 11/26/22  Intake/Output from previous day: 03/11 0701 - 03/12 0700 In: 107.2 [IV Piggyback:107.2] Out: 1600 [Urine:1600] Intake/Output this shift: Total I/O In: 240 [P.O.:240] Out: -   PE: Chaperone, RN present Gen:  Alert, NAD, pleasant Abd: Soft, ND, NT In the left groin just medial to the groin crease is a superficial area of tenderness, erythema, induration and warmth. There is a punctate area of bloody/purulent drainage here consistent with self draining small subcutaneous abscess. Opening is ~0.5cm.   Lab Results:  Recent Labs    11/30/22 0020 12/01/22 0017  WBC 4.0 4.4  HGB 11.5* 12.1*  HCT 34.8* 36.8*  PLT 205 228    BMET Recent Labs    11/30/22 0020 12/01/22 0017  NA 134* 136  K 4.2 4.4  CL 100 98  CO2 28 25  GLUCOSE 93 116*  BUN 32* 29*  CREATININE 2.57* 2.53*  CALCIUM 8.9 9.1    PT/INR Recent Labs    11/30/22 0020 12/01/22 0017  LABPROT 27.2* 25.4*  INR 2.6* 2.3*    CMP     Component Value Date/Time   NA 136 12/01/2022 0017   NA 141 07/06/2014 1425   K 4.4 12/01/2022 0017   K 3.6 07/06/2014 1425   CL 98 12/01/2022 0017   CL 110 (H) 07/06/2014 1425   CO2 25 12/01/2022 0017   CO2 30 07/06/2014 1425   GLUCOSE 116 (H) 12/01/2022 0017   GLUCOSE 93 07/06/2014 1425   BUN 29 (H) 12/01/2022 0017   BUN 15 07/06/2014 1425   CREATININE 2.53 (H) 12/01/2022 0017   CREATININE 1.45 (H) 07/06/2014 1425   CALCIUM 9.1 12/01/2022 0017    CALCIUM 8.5 07/06/2014 1425   PROT 7.3 11/27/2022 2304   ALBUMIN 3.9 11/27/2022 2304   AST 29 11/27/2022 2304   ALT 31 11/27/2022 2304   ALKPHOS 136 (H) 11/27/2022 2304   BILITOT 1.1 11/27/2022 2304   GFRNONAA 27 (L) 12/01/2022 0017   GFRNONAA 52 (L) 07/06/2014 1425   GFRAA 57 (L) 03/03/2020 1020   GFRAA >60 07/06/2014 1425   Lipase  No results found for: "LIPASE"  Studies/Results: No results found.  Anti-infectives: Anti-infectives (From admission, onward)    Start     Dose/Rate Route Frequency Ordered Stop   11/30/22 1600  cefTRIAXone (ROCEPHIN) 2 g in sodium chloride 0.9 % 100 mL IVPB        2 g 200 mL/hr over 30 Minutes Intravenous Every 24 hours 11/30/22 1303     11/30/22 1600  vancomycin (VANCOREADY) IVPB 500 mg/100 mL        500 mg 100 mL/hr over 60 Minutes Intravenous Every 24 hours 11/30/22 1303     11/30/22 1315  linezolid (ZYVOX) tablet 600 mg  Status:  Discontinued        600 mg Oral Every 12 hours 11/30/22 1222 11/30/22 1257   11/30/22 1315  amoxicillin-clavulanate (AUGMENTIN) 500-125 MG per tablet  1 tablet  Status:  Discontinued        1 tablet Oral Every 12 hours 11/30/22 1222 11/30/22 1257   11/29/22 1715  vancomycin (VANCOREADY) IVPB 500 mg/100 mL  Status:  Discontinued       See Hyperspace for full Linked Orders Report.   500 mg 100 mL/hr over 60 Minutes Intravenous Every 24 hours 11/28/22 1519 11/30/22 1222   11/28/22 1615  vancomycin (VANCOCIN) IVPB 1000 mg/200 mL premix       See Hyperspace for full Linked Orders Report.   1,000 mg 200 mL/hr over 60 Minutes Intravenous  Once 11/28/22 1519 11/28/22 1805   11/28/22 1600  cefTRIAXone (ROCEPHIN) 2 g in sodium chloride 0.9 % 100 mL IVPB  Status:  Discontinued        2 g 200 mL/hr over 30 Minutes Intravenous Every 24 hours 11/28/22 1508 11/30/22 1222   11/28/22 1345  cefTRIAXone (ROCEPHIN) 2 g in sodium chloride 0.9 % 100 mL IVPB  Status:  Discontinued        2 g 200 mL/hr over 30 Minutes Intravenous  Every 24 hours 11/28/22 1245 11/28/22 1247   11/28/22 1345  ceFAZolin (ANCEF) IVPB 2g/100 mL premix  Status:  Discontinued        2 g 200 mL/hr over 30 Minutes Intravenous Every 12 hours 11/28/22 1247 11/28/22 1325        Assessment/Plan Left groin abscess - CT pelvis with 2.2 x 1.5 cm abscess along the lateral aspect of the left hemiscrotum  - IR evaluated 3/9 and felt there was no evidence of formed abscess, just inflammation/phlegmon and no role for aspiration at this time.  - Area is spontaneously draining. Continue warm compresses and abx. No indication for emergency surgery.  - if erythema or swelling worsens we can re-evaluate but do not anticipate need for surgical drainage at this time. Ok to transition to PO abx from general surgery standpoint but defer to TCTS and heart failure team in setting of LVAD. Please re-consult if we can be of further assistance, general surgery will sign off  FEN - HH diet VTE - SCDs, INR 2.3 ID - Rocephin/Vanc. Afebrile. WBC 4.4  CAD HTN Atrial fibrillation on Coumadin with INR of 2.3 Chronic systolic CHF s/p HMIII LVAD  I reviewed nursing notes, last 24 h vitals and pain scores, last 48 h intake and output, and last 24 h labs and trends.    LOS: 4 days    Norm Parcel , [facility] Surgery 12/01/2022, 9:00 AM Please see Amion for pager number during day hours 7:00am-4:30pm

## 2022-12-01 NOTE — Plan of Care (Signed)
  Problem: Cardiac: Goal: LVAD will function as expected and patient will experience no clinical alarms Outcome: Progressing   Problem: Education: Goal: Knowledge of General Education information will improve Description: Including pain rating scale, medication(s)/side effects and non-pharmacologic comfort measures Outcome: Progressing   Problem: Clinical Measurements: Goal: Cardiovascular complication will be avoided Outcome: Progressing   Problem: Pain Managment: Goal: General experience of comfort will improve Outcome: Progressing   Problem: Skin Integrity: Goal: Risk for impaired skin integrity will decrease Outcome: Progressing

## 2022-12-01 NOTE — Progress Notes (Signed)
Pharmacy Antibiotic Note  Isaac [name] is a 72 y.o. male admitted on 11/27/2022 with  groin abscess .  Pharmacy has been consulted for vancomycin dosing.  SCr down to 2.57, est CrCl 27 ml/min. No AUC dosing with LVAD.  Vancomycin trough 10 mcg/ml (subtherapeutic) on '500mg'$  IV q24h (Goal 15-20 mcg/ml)  Plan: Increase vancomycin to '750mg'$  IV q24h - next dose at 1000 instead of waiting full 24 hours Levels at steady state if continued  Weight: 81 kg (178 lb 9.2 oz)  Temp (24hrs), Avg:98 F (36.7 C), Min:97.7 F (36.5 C), Max:98.1 F (36.7 C)  Recent Labs  Lab 11/27/22 2304 11/28/22 0014 11/29/22 0008 11/30/22 0020 12/01/22 0017 12/01/22 1558  WBC 4.7 4.7 3.7* 4.0 4.4  --   CREATININE 3.00* 3.00* 2.52* 2.57* 2.53*  --   VANCOTROUGH  --   --   --   --   --  10*     Estimated Creatinine Clearance: 27.2 mL/min (A) (by C-G formula based on SCr of 2.53 mg/dL (H)).    Allergies  Allergen Reactions   Mushroom Extract Complex Nausea And Vomiting   Doxycycline Nausea And Vomiting   Neosporin [Neomycin-Bacitracin Zn-Polymyx] Hives   Tape Other (See Comments)    Some tapes/dressings can irritate the skin    Antimicrobials this admission: Vanc 3/9 >>  CTX 3/9 >>   Dose adjustments this admission: none  Microbiology results: 3/8 BCx: ngtd 3/8 MRSA PCR: neg  Thank you for involving pharmacy in this patient's care.  Sherlon Handing, PharmD, BCPS Please see amion for complete clinical pharmacist phone list 12/01/2022 6:06 PM

## 2022-12-02 DIAGNOSIS — L02214 Cutaneous abscess of groin: Secondary | ICD-10-CM | POA: Diagnosis not present

## 2022-12-02 DIAGNOSIS — Z952 Presence of prosthetic heart valve: Secondary | ICD-10-CM | POA: Diagnosis not present

## 2022-12-02 DIAGNOSIS — I5022 Chronic systolic (congestive) heart failure: Secondary | ICD-10-CM | POA: Diagnosis not present

## 2022-12-02 LAB — BASIC METABOLIC PANEL
Anion gap: 12 (ref 5–15)
BUN: 26 mg/dL — ABNORMAL HIGH (ref 8–23)
CO2: 23 mmol/L (ref 22–32)
Calcium: 9.1 mg/dL (ref 8.9–10.3)
Chloride: 99 mmol/L (ref 98–111)
Creatinine, Ser: 2.55 mg/dL — ABNORMAL HIGH (ref 0.61–1.24)
GFR, Estimated: 26 mL/min — ABNORMAL LOW (ref >60)
Glucose, Bld: 87 mg/dL (ref 70–99)
Potassium: 4.1 mmol/L (ref 3.5–5.1)
Sodium: 134 mmol/L — ABNORMAL LOW (ref 135–145)

## 2022-12-02 LAB — CBC
HCT: 36.2 % — ABNORMAL LOW (ref 39.0–52.0)
Hemoglobin: 11.6 g/dL — ABNORMAL LOW (ref 13.0–17.0)
MCH: 29.9 pg (ref 26.0–34.0)
MCHC: 32 g/dL (ref 30.0–36.0)
MCV: 93.3 fL (ref 80.0–100.0)
Platelets: 218 K/uL (ref 150–400)
RBC: 3.88 MIL/uL — ABNORMAL LOW (ref 4.22–5.81)
RDW: 15.9 % — ABNORMAL HIGH (ref 11.5–15.5)
WBC: 4.1 K/uL (ref 4.0–10.5)
nRBC: 0 % (ref 0.0–0.2)

## 2022-12-02 LAB — PROTIME-INR
INR: 2.2 — ABNORMAL HIGH (ref 0.8–1.2)
Prothrombin Time: 24.6 seconds — ABNORMAL HIGH (ref 11.4–15.2)

## 2022-12-02 LAB — LACTATE DEHYDROGENASE: LDH: 153 U/L (ref 98–192)

## 2022-12-02 MED ORDER — WARFARIN SODIUM 4 MG PO TABS
4.0000 mg | ORAL_TABLET | Freq: Once | ORAL | Status: AC
  Administered 2022-12-02: 4 mg via ORAL
  Filled 2022-12-02: qty 1

## 2022-12-02 NOTE — Progress Notes (Signed)
[place] for warfarin Indication:  LVAD (HM3) and hx of AF  Allergies  Allergen Reactions   Mushroom Extract Complex Nausea And Vomiting   Doxycycline Nausea And Vomiting   Neosporin [Neomycin-Bacitracin Zn-Polymyx] Hives   Tape Other (See Comments)    Some tapes/dressings can irritate the skin    Patient Measurements: Weight: 81.1 kg (178 lb 12.7 oz)  Vital Signs: Temp: 98 F (36.7 C) (03/13 0402) Temp Source: Oral (03/13 0402) BP: 85/73 (03/13 0402) Pulse Rate: 73 (03/12 2343)  Labs: Recent Labs    11/30/22 0020 12/01/22 0017 12/02/22 0012  HGB 11.5* 12.1* 11.6*  HCT 34.8* 36.8* 36.2*  PLT 205 228 218  LABPROT 27.2* 25.4* 24.6*  INR 2.6* 2.3* 2.2*  CREATININE 2.57* 2.53* 2.55*     Estimated Creatinine Clearance: 27 mL/min (A) (by C-G formula based on SCr of 2.55 mg/dL (H)).   Medical History: Past Medical History:  Diagnosis Date   Arrhythmia    atrial fibrillation   CHF (congestive heart failure) (HCC)    Chronic kidney disease    Coronary artery disease    Hyperlipidemia    Hypertension    Myocardial infarct (HCC)     Medications:  Medications Prior to Admission  Medication Sig Dispense Refill Last Dose   albuterol (VENTOLIN HFA) 108 (90 Base) MCG/ACT inhaler Inhale 2 puffs into the lungs every 6 (six) hours as needed. (Patient taking differently: Inhale 2 puffs into the lungs every 6 (six) hours as needed for wheezing or shortness of breath.) 1 each 3 unk   amiodarone (PACERONE) 200 MG tablet Take 1 tablet (200 mg total) by mouth daily. Take 200 mg twice a day x 7 days, then resume a dose of 200 mg daily. (Patient taking differently: Take 200 mg by mouth daily.) 30 tablet 6 11/27/2022 at 0930   furosemide (LASIX) 40 MG tablet Take 1.5 tablets (60 mg total) by mouth daily. 70 tablet 3 11/27/2022 at am   pantoprazole (PROTONIX) 40 MG tablet TAKE 1 TABLET BY MOUTH EVERY DAY (Patient taking differently: Take 40 mg by  mouth daily before breakfast.) 90 tablet 3 11/27/2022 at am   rosuvastatin (CRESTOR) 10 MG tablet TAKE 1 TABLET BY MOUTH EVERY DAY (Patient taking differently: Take 10 mg by mouth at bedtime.) 90 tablet 3 11/26/2022 at pm   sildenafil (REVATIO) 20 MG tablet TAKE 1 TABLET BY MOUTH THREE TIMES DAILY 270 tablet 2 11/27/2022 at am   warfarin (COUMADIN) 2 MG tablet Take 4 mg (2 tabs) every Monday/Wednesday/Friday and 2 mg (1 tab) all other days or as directed by the [facility]. (Patient taking differently: Take 2-4 mg by mouth See admin instructions. Beginning on 11/27/2022, take 4 mg by mouth once a day on Fri/Sun/Mon/Wed and 2 mg on Sat/Tues. Next INR is on 12/03/2022.) 120 tablet 11 11/25/2022 at 1900   enoxaparin (LOVENOX) 40 MG/0.4ML injection Inject 0.4 mLs (40 mg total) into the skin every 12 (twelve) hours. (Patient not taking: Reported on 11/27/2022) 4 mL 0 Not Taking   potassium chloride SA (KLOR-CON M) 20 MEQ tablet Take 1 tablet (20 mEq total) by mouth daily. (Patient not taking: Reported on 11/27/2022) 30 tablet 6 Not Taking   Scheduled:   amiodarone  200 mg Oral Daily   furosemide  60 mg Oral Daily   pantoprazole  40 mg Oral Daily   rosuvastatin  10 mg Oral QHS   sildenafil  20 mg Oral TID   Warfarin -  Pharmacist Dosing Inpatient   Does not apply q1600   Infusions:   cefTRIAXone (ROCEPHIN)  IV 2 g (12/01/22 1648)   vancomycin      Assessment: 46 yoM with LVAD admitted with groin abscess. Pt on warfarin PTA (dose '4mg'$  daily except '2mg'$  Tues/Thurs/Sat).  INR therapeutic at 2.2 today. CBC and LDH stable.  Goal of Therapy:  INR 2-2.5  Plan:  Warfarin '4mg'$  PO x1 again tonight - will try to resume home dose Daily INR  Arrie Senate, PharmD, BCPS, [facility] Clinical Pharmacist [phone] Please check AMION for all [place] numbers 12/02/2022

## 2022-12-02 NOTE — Progress Notes (Signed)
LVAD Coordinator Rounding Note:  Admitted 11/28/22 to Heart Failure service for left groin abscess. Pt was seen by general surgery at [facility] and informed that he may need surgery. Direct admitted pt Friday evening for further testing per [facility] Surgery here in [place].  HM III LVAD implanted on 09/17/21 by Dr Cyndia Bent under destination therapy criteria. 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   Pt reports he is feeling better this morning. Left groin assessed, pain and redness improving. Area was not tender to palpation. Pt tells me he was able to express a small "stone" from area Monday night. DR Darcey Nora would like pt to remain on IV antibiotics until Friday to ensure that infection is clearing and redness/pain is better prior to switching to PO antibiotics.    Dr Prescott Gum at bedside to obtain wound culture. See his note for documentation of sterile procedure.  IV team at bedside to place new IV.   Vital signs: Temp: 98.0 HR: 83 A paced Doppler Pressure: 76 Auto Cuff BP: 85/73 (79) O2 Sat: 95% on RA Wt: 179>178.5>178.8 lbs    LVAD interrogation reveals:  Speed: 5300 Flow: 4.5 Power: 3.8w PI: 3.1 Hematocrit: 37  Alarms: none Events: 5 today  Fixed speed: 5300 Low speed limit: 5000  Drive Line:  Existing VAD dressing C/D/I with anchor intact and accurately applied. Weekly dressing changes per VAD coordinator, or bedside nurse. Next dressing change due: 12/04/22.   Labs:  LDH trend: 141>148>153  INR trend: 2.6>2.3>2.2  Anticoagulation Plan: -INR Goal: 2.0 - 2.5   Device: - [employer] dual - AV paced 60 -Therapies: on  Infection:  11/29/22>> blood cultures >>no growth 2 days 12/02/22>> groin abscess culture>>  Plan/Recommendations:  Page VAD coordinator for drive line or equipment concerns 2. Weekly dressing changes.  Emerson Monte RN [place] Coordinator  Office:  [phone]  24/7 Pager: [phone]

## 2022-12-02 NOTE — Progress Notes (Signed)
TCTS  I examined the patient's left groin/scrotal wound. With sterile technique I opened the sinus tract with a hemostat and obtained a deep tissue culture with the aerobic culture swab.  Minimal serosanguineous drainage was expressed.The area is mildly indurated. Mepelex pad applied.  Blood pressure (!) 85/73, pulse 73, temperature 98 F (36.7 C), temperature source Oral, resp. rate 14, weight 81.1 kg, SpO2 95 %.  indurated.    Continue current IV antibiotics and use culture results to help guide oral antibiotic therapy at discharge.  P Prescott Gum MD

## 2022-12-02 NOTE — Progress Notes (Addendum)
Advanced Heart Failure VAD Rounding Note   PCP-Cardiologist: Dr. Aundra Dubin   Reason for Admission: Left Groin Abscess   HPI/Interval hx:   Plan to continue IV abx through Friday, then switch to PO linezolid and augmentin.  Feels ok. Tender L groin   LVAD INTERROGATION:  HeartMate III LVAD:  Flow 4.8 liters/min, speed 5300, power 4, PI 2.3 .  No PI events today  Objective:    Vitals:   12/02/22 0400 12/02/22 0402  BP: (!) 85/73 (!) 85/73  Pulse:    Resp:  14  Temp:  98 F (36.7 C)  SpO2:  95%    MAP 70s-80s  Physical Exam   Physical Exam: GENERAL: No acute distress. HEENT: normal  NECK: Supple, JVP  5-6  2+ bilaterally, no bruits.  No lymphadenopathy or thyromegaly appreciated.   CARDIAC:  Mechanical heart sounds with LVAD hum present.  LUNGS:  Clear to auscultation bilaterally.  ABDOMEN:  Soft, round, nontender, positive bowel sounds x4.     LVAD exit site: well-healed and incorporated.  Dressing dry and intact.  No erythema or drainage.  Stabilization device present and accurately applied.  Driveline dressing is being changed daily per sterile technique. EXTREMITIES:  Warm and dry, no cyanosis, clubbing, rash or edema . L groin indurated with minimal exudate and tender.  NEUROLOGIC:  Alert and oriented x 3.    No aphasia.  No dysarthria.  Affect pleasant.     Telemetry    A fib 60s wit occasional PVCs.  EKG   No new EKG to review  Labs    Basic Metabolic Panel: Recent Labs  Lab 11/27/22 2304 11/28/22 0014 11/29/22 0008 11/30/22 0020 12/01/22 0017 12/02/22 0012  NA 138 137 134* 134* 136 134*  K 3.9 4.1 4.1 4.2 4.4 4.1  CL 106 103 100 100 98 99  CO2 27 20* '23 28 25 23  '$ GLUCOSE 104* 77 74 93 116* 87  BUN 50* 48* 36* 32* 29* 26*  CREATININE 3.00* 3.00* 2.52* 2.57* 2.53* 2.55*  CALCIUM 9.2 9.2 9.0 8.9 9.1 9.1  MG 2.2  --   --   --   --   --    Liver Function Tests: Recent Labs  Lab 11/27/22 2304  AST 29  ALT 31  ALKPHOS 136*  BILITOT 1.1  PROT  7.3  ALBUMIN 3.9   CBC: Recent Labs  Lab 11/27/22 2304 11/28/22 0014 11/29/22 0008 11/30/22 0020 12/01/22 0017 12/02/22 0012  WBC 4.7 4.7 3.7* 4.0 4.4 4.1  NEUTROABS 2.6  --   --   --   --   --   HGB 12.0* 10.9* 11.6* 11.5* 12.1* 11.6*  HCT 37.7* 33.2* 35.7* 34.8* 36.8* 36.2*  MCV 93.1 92.0 92.0 92.3 93.2 93.3  PLT 199 179 206 205 228 218   BNP: BNP (last 3 results) Recent Labs    07/02/22 1642  BNP 501.5*   Coagulation Studies: Recent Labs    11/30/22 0020 12/01/22 0017 12/02/22 0012  LABPROT 27.2* 25.4* 24.6*  INR 2.6* 2.3* 2.2*   Other results: pending  Imaging    No results found.  Patient Profile:   72 y.o. with history of CAD, HTN, atrial fibrillation, chronic systolic CHF s/p HMIII LVAD and CKD IV, direct admitted for treatment of left groin abscess.   Assessment/Plan:   1. Left hemiscrotum abscess - Blood cx pending - CT pelvis w/ 2.2x1.5cm superficial left hemiscrotum abscess; on exam very tender to palpation with diffuse erythema and some  induration.  - IR evaluated 3/9 and felt there was no evidence of formed abscess, just inflammation/phlegmon and no role for aspiration at this time.  - General surgery following (signed off 3/12): abscess probed; continue warm compresses. Continue rocephin/vanc. General surgery evaluated, no need for surgical drainage, ok for PO abx. Area spontaneously draining and cellulitis improving. Plan for IV abx until Friday then will switch to PO linezolid and Augmentin .  - INR 2.2  Discussed with pharmacy. Adjusting dosing.   2. Chronic systolic CHF:  Long-standing cardiomyopathy.  [place].  Echo in 12/22 with EF < 20%, severe LV dilation, restrictive diastolic function, moderate RV dysfunction, moderate MR, mod-severe TR. Cause of cardiomyopathy is uncertain.  He has a history of CAD, but I do not think that the described CAD from the past could explain his cardiomyopathy, but CAD could have progressed.  He  was admitted in 12/22 with cardiogenic shock and Impella 5.5. s/p HM III VAD on 09/17/22.  Post-op course complicated by AKI and RV failure. He was on HD for several months but is now off.   - Continue Lasix 60 mg daily.  - Continue sildenafil 20 tid for RV.   - On Warfarin. INR goal 2-2.5.   - INR 2.2 - LDH stable.  -Renal function stable.     3. Tricuspid regurgitation:  Tricuspid repair not done at time of VAD due to proximity of ICD wires and hypotension during surgery.  He has severe TR.   4. Atrial fibrillation:  - Continue amiodarone 200 mg daily? May need to stop and switch to Toprol XL for rate control. He does have PVCs. I will discuss with Dr Aundra Dubin.   5. CKD stage IV: Suspect intra-op hypotension at the time of LVAD placement led to development of ATN => urine sediment looked like ATN per renal. Had initial CVVH and transitioned over to HD. He is now off HD with creatinine stable, most recently 2.8.   -Stable at 2.5 today.   6. CAD: History of PCI to OM1 in 2007 and RCA in 2013.  No CP or ACs.  - Deferred cardiac cath in 12/22 due to AKI and plan for VAD - Continue Crestor.    Ambulate.   I reviewed the LVAD parameters from today, and compared the results to the patient's prior recorded data.  No programming changes were made.  The LVAD is functioning within specified parameters.  The patient performs LVAD self-test daily.  LVAD interrogation was negative for any significant power changes, alarms or PI events/speed drops.  LVAD equipment check completed and is in good working order.  Back-up equipment present.   LVAD education done on emergency procedures and precautions and reviewed exit site care.  Amy Clegg NP-C   Advanced Heart Failure Team Pager [phone] (M-F; 7a - 5p)  Please contact [facility] for night-coverage after hours (5p -7a ) and weekends on [email or web address] for all non- LVAD Issues   Patient seen with NP, agree with the above note .  Doing well, minimal pain  in leg.   General: Well appearing this am. NAD.  HEENT: Normal. Neck: Supple, JVP 7-8 cm. Carotids OK.  Cardiac:  Mechanical heart sounds with LVAD hum present.  Lungs:  CTAB, normal effort.  Abdomen:  NT, ND, no HSM. No bruits or masses. +BS  LVAD exit site: Well-healed and incorporated. Dressing dry and intact. No erythema or drainage. Stabilization device present and accurately applied. Driveline dressing changed daily per sterile technique.  Extremities:  Warm and dry. No cyanosis, clubbing, rash, or edema.  Neuro:  Alert & oriented x 3. Cranial nerves grossly intact. Moves all 4 extremities w/o difficulty. Affect pleasant    Continue IV abx for groin abscess/cellulitis until Friday.  At that time, will transition to po .   Think we can decrease his amiodarone to 100 mg daily.   He asks about ICD generator change.  He does have h/o VT treated with ATP so think this would be reasonable.   Loralie Champagne 12/02/2022 2:38 PM

## 2022-12-02 NOTE — Progress Notes (Signed)
Mobility Specialist Progress Note:   12/02/22 1114  Mobility  Activity Ambulated with assistance in hallway  Level of Assistance Independent  Assistive Device None  Distance Ambulated (ft) 800 ft  Activity Response Tolerated well  $Mobility charge 1 Mobility   Pt in bed willing to participate in mobility. No complaints of pain. Left EOB with call bell in reach and all needs met.   Gareth Eagle Arienne Gartin Mobility Specialist Please contact via [employer] or  Rehab Office at [phone]

## 2022-12-03 ENCOUNTER — Other Ambulatory Visit (HOSPITAL_COMMUNITY): Payer: Self-pay

## 2022-12-03 DIAGNOSIS — L02214 Cutaneous abscess of groin: Secondary | ICD-10-CM | POA: Diagnosis not present

## 2022-12-03 DIAGNOSIS — I5022 Chronic systolic (congestive) heart failure: Secondary | ICD-10-CM | POA: Diagnosis not present

## 2022-12-03 DIAGNOSIS — Z952 Presence of prosthetic heart valve: Secondary | ICD-10-CM | POA: Diagnosis not present

## 2022-12-03 LAB — BASIC METABOLIC PANEL
Anion gap: 8 (ref 5–15)
BUN: 30 mg/dL — ABNORMAL HIGH (ref 8–23)
CO2: 25 mmol/L (ref 22–32)
Calcium: 9 mg/dL (ref 8.9–10.3)
Chloride: 104 mmol/L (ref 98–111)
Creatinine, Ser: 2.62 mg/dL — ABNORMAL HIGH (ref 0.61–1.24)
GFR, Estimated: 25 mL/min — ABNORMAL LOW (ref >60)
Glucose, Bld: 98 mg/dL (ref 70–99)
Potassium: 4.5 mmol/L (ref 3.5–5.1)
Sodium: 137 mmol/L (ref 135–145)

## 2022-12-03 LAB — PROTIME-INR
INR: 2.3 — ABNORMAL HIGH (ref 0.8–1.2)
Prothrombin Time: 25.5 seconds — ABNORMAL HIGH (ref 11.4–15.2)

## 2022-12-03 LAB — CBC
HCT: 34.6 % — ABNORMAL LOW (ref 39.0–52.0)
Hemoglobin: 11 g/dL — ABNORMAL LOW (ref 13.0–17.0)
MCH: 30.1 pg (ref 26.0–34.0)
MCHC: 31.8 g/dL (ref 30.0–36.0)
MCV: 94.5 fL (ref 80.0–100.0)
Platelets: 207 10*3/uL (ref 150–400)
RBC: 3.66 MIL/uL — ABNORMAL LOW (ref 4.22–5.81)
RDW: 15.8 % — ABNORMAL HIGH (ref 11.5–15.5)
WBC: 4.5 10*3/uL (ref 4.0–10.5)
nRBC: 0 % (ref 0.0–0.2)

## 2022-12-03 LAB — LACTATE DEHYDROGENASE: LDH: 169 U/L (ref 98–192)

## 2022-12-03 MED ORDER — WARFARIN SODIUM 2 MG PO TABS
2.0000 mg | ORAL_TABLET | Freq: Once | ORAL | Status: AC
  Administered 2022-12-03: 2 mg via ORAL
  Filled 2022-12-03: qty 1

## 2022-12-03 NOTE — Progress Notes (Addendum)
Advanced Heart Failure VAD Rounding Note   PCP-Cardiologist: Dr. Aundra Dubin   Reason for Admission: Left Groin Abscess   HPI/Interval hx:   Plan to continue IV abx through Friday, then switch to PO linezolid and augmentin.  Feels good this morning. Sitting on EOB eating breakfast. Denies CP/SOB. Ambulated yesterday, no complaints. Groin site less sore.   LVAD INTERROGATION:  HeartMate III LVAD:  Flow 4.5 liters/min, speed 5300, power 3.9, PI 3.1 .  No PI events today  Objective:    Vitals:   12/03/22 0434 12/03/22 0737  BP: (!) 89/66 (!) 88/77  Pulse: 66   Resp: 16 13  Temp: 97.7 F (36.5 C) 97.7 F (36.5 C)  SpO2: 92% 96%    MAP 80s-90s  Physical Exam  General:  Well appearing. No resp difficulty HEENT: Normal Neck: supple. JVP ~6. Carotids 2+ bilat; no bruits. No lymphadenopathy or thyromegaly appreciated. Cor: Mechanical heart sounds with LVAD hum present. Lungs: Clear Abdomen: soft, nontender, nondistended. No hepatosplenomegaly. No bruits or masses. Good bowel sounds. Driveline: C/D/I; securement device intact and driveline incorporated Extremities: no cyanosis, clubbing, rash, edema. L groin small indurated site, tender Neuro: alert & orientedx3, cranial nerves grossly intact. moves all 4 extremities w/o difficulty. Affect pleasant   Telemetry   A fib 60s 6-16 PVC's/hr (Personally reviewed)    EKG   No new EKG to review  Labs    Basic Metabolic Panel: Recent Labs  Lab 11/27/22 2304 11/28/22 0014 11/29/22 0008 11/30/22 0020 12/01/22 0017 12/02/22 0012 12/03/22 0010  NA 138   < > 134* 134* 136 134* 137  K 3.9   < > 4.1 4.2 4.4 4.1 4.5  CL 106   < > 100 100 98 99 104  CO2 27   < > '23 28 25 23 25  '$ GLUCOSE 104*   < > 74 93 116* 87 98  BUN 50*   < > 36* 32* 29* 26* 30*  CREATININE 3.00*   < > 2.52* 2.57* 2.53* 2.55* 2.62*  CALCIUM 9.2   < > 9.0 8.9 9.1 9.1 9.0  MG 2.2  --   --   --   --   --   --    < > = values in this interval not displayed.    Liver Function Tests: Recent Labs  Lab 11/27/22 2304  AST 29  ALT 31  ALKPHOS 136*  BILITOT 1.1  PROT 7.3  ALBUMIN 3.9   CBC: Recent Labs  Lab 11/27/22 2304 11/28/22 0014 11/29/22 0008 11/30/22 0020 12/01/22 0017 12/02/22 0012 12/03/22 0010  WBC 4.7   < > 3.7* 4.0 4.4 4.1 4.5  NEUTROABS 2.6  --   --   --   --   --   --   HGB 12.0*   < > 11.6* 11.5* 12.1* 11.6* 11.0*  HCT 37.7*   < > 35.7* 34.8* 36.8* 36.2* 34.6*  MCV 93.1   < > 92.0 92.3 93.2 93.3 94.5  PLT 199   < > 206 205 228 218 207   < > = values in this interval not displayed.   BNP: BNP (last 3 results) Recent Labs    07/02/22 1642  BNP 501.5*   Coagulation Studies: Recent Labs    12/01/22 0017 12/02/22 0012 12/03/22 0010  LABPROT 25.4* 24.6* 25.5*  INR 2.3* 2.2* 2.3*   Other results: pending  Imaging    No results found.  Patient Profile:   72 y.o. with history of CAD,  HTN, atrial fibrillation, chronic systolic CHF s/p HMIII LVAD and CKD IV, direct admitted for treatment of left groin abscess.   Assessment/Plan:   1. Left hemiscrotum abscess - Blood cx pending - CT pelvis w/ 2.2x1.5cm superficial left hemiscrotum abscess; on exam very tender to palpation with diffuse erythema and some induration.  - IR evaluated 3/9 and felt there was no evidence of formed abscess, just inflammation/phlegmon and no role for aspiration at this time.  - General surgery following (signed off 3/12): abscess probed; continue warm compresses. Continue rocephin/vanc. General surgery evaluated, no need for surgical drainage, ok for PO abx. Area spontaneously draining and cellulitis improving. Plan for IV abx until Friday then will switch to PO linezolid and Augmentin .  - INR 2.3  Discussed with pharmacy.  2. Chronic systolic CHF:  Long-standing cardiomyopathy.  [place].  Echo in 12/22 with EF < 20%, severe LV dilation, restrictive diastolic function, moderate RV dysfunction, moderate MR, mod-severe  TR. Cause of cardiomyopathy is uncertain.  He has a history of CAD, but I do not think that the described CAD from the past could explain his cardiomyopathy, but CAD could have progressed.  He was admitted in 12/22 with cardiogenic shock and Impella 5.5. s/p HM III VAD on 09/17/22.  Post-op course complicated by AKI and RV failure. He was on HD for several months but is now off.   - Continue Lasix 60 mg daily.  - Continue sildenafil 20 tid for RV.   - On Warfarin. INR goal 2-2.5.   - INR 2.3 - LDH stable.  - Renal function stable.     3. Tricuspid regurgitation:  Tricuspid repair not done at time of VAD due to proximity of ICD wires and hypotension during surgery.  He has severe TR.   4. Atrial fibrillation:  - Continue amiodarone 200 mg daily. May need to stop and switch to Toprol XL for rate control. He does have PVCs.   5. CKD stage IV: Suspect intra-op hypotension at the time of LVAD placement led to development of ATN => urine sediment looked like ATN per renal. Had initial CVVH and transitioned over to HD. He is now off HD with creatinine stable, most recently 2.8.   -Stable at 2.5 today.   6. CAD: History of PCI to OM1 in 2007 and RCA in 2013.  No CP or ACs.  - Deferred cardiac cath in 12/22 due to AKI and plan for VAD - Continue Crestor.    Ambulate.   I reviewed the LVAD parameters from today, and compared the results to the patient's prior recorded data.  No programming changes were made.  The LVAD is functioning within specified parameters.  The patient performs LVAD self-test daily.  LVAD interrogation was negative for any significant power changes, alarms or PI events/speed drops.  LVAD equipment check completed and is in good working order.  Back-up equipment present.   LVAD education done on emergency procedures and precautions and reviewed exit site care.  Earnie Larsson AGACNP-BC  Advanced Heart Failure Team   Patient seen with NP, agree with the above note.   Creatinine  2.6 today.  No complaints.   General: Well appearing this am. NAD.  HEENT: Normal. Neck: Supple, JVP 7-8 cm. Carotids OK.  Cardiac:  Mechanical heart sounds with LVAD hum present.  Lungs:  CTAB, normal effort.  Abdomen:  NT, ND, no HSM. No bruits or masses. +BS  LVAD exit site: Well-healed and incorporated. Dressing dry and  intact. No erythema or drainage. Stabilization device present and accurately applied. Driveline dressing changed daily per sterile technique. Extremities:  Warm and dry. No cyanosis, clubbing, rash, or edema.  Neuro:  Alert & oriented x 3. Cranial nerves grossly intact. Moves all 4 extremities w/o difficulty. Affect pleasant    Continue IV abx for groin abscess/cellulitis until Friday.  At that time, will transition to po .    He asks about ICD generator change.  He does have h/o VT treated with ATP so think this would be reasonable.   Loralie Champagne 12/03/2022 2:40 PM

## 2022-12-03 NOTE — Progress Notes (Signed)
LVAD Coordinator Rounding Note:  Admitted 11/28/22 to Heart Failure service for left groin abscess. Pt was seen by general surgery at [facility] and informed that he may need surgery. Direct admitted pt Friday evening for further testing per [facility] here in [place].  HM III LVAD implanted on 09/17/21 by Dr Isaac [name] under destination therapy criteria. 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   Pt reports he is feeling better this morning. Left groin assessed, redness improved. Pt reports no pain. Area was not tender to palpation. DR Isaac [name] would like pt to remain on IV antibiotics until Friday to ensure that infection is clearing and redness/pain is better prior to switching to PO antibiotics.   Vital signs: Temp: 97.7 HR: 66 A paced Doppler Pressure: 76 Auto Cuff BP: 88/77 (83) O2 Sat: 96% on RA Wt: 179>178.5>178.8>178.3 lbs    LVAD interrogation reveals:  Speed: 5300 Flow: 4.6 Power: 3.8w PI: 2.9 Hematocrit: 34  Alarms: none Events: 1 yesterday; none today  Fixed speed: 5300 Low speed limit: 5000  Drive Line:  Existing VAD dressing C/D/I with anchor intact and accurately applied. Weekly dressing changes per VAD coordinator, or bedside nurse. Next dressing change due: 12/04/22.   Labs:  LDH trend: 141>148>153>169  INR trend: 2.6>2.3>2.2>2.3  Anticoagulation Plan: -INR Goal: 2.0 - 2.5   Device: - [employer] dual - AV paced 60 -Therapies: on  Infection:  11/29/22>> blood cultures >>no growth 3 days 12/02/22>> groin abscess culture>>no growth 24 hrs  Plan/Recommendations:  Page VAD coordinator for drive line or equipment concerns 2. Weekly dressing changes.  Isaac Rockers RN [place] Coordinator  Office: [phone]  24/7 Pager: [phone]

## 2022-12-03 NOTE — Progress Notes (Signed)
Remote ICD transmission.   

## 2022-12-03 NOTE — Progress Notes (Signed)
[place] for warfarin Indication:  LVAD (HM3) and hx of AF  Allergies  Allergen Reactions   Mushroom Extract Complex Nausea And Vomiting   Doxycycline Nausea And Vomiting   Neosporin [Neomycin-Bacitracin Zn-Polymyx] Hives   Tape Other (See Comments)    Some tapes/dressings can irritate the skin    Patient Measurements: Weight: 80.9 kg (178 lb 5.6 oz)  Vital Signs: Temp: 97.7 F (36.5 C) (03/14 0737) Temp Source: Oral (03/14 0737) BP: 88/77 (03/14 0737) Pulse Rate: 66 (03/14 0434)  Labs: Recent Labs    12/01/22 0017 12/02/22 0012 12/03/22 0010  HGB 12.1* 11.6* 11.0*  HCT 36.8* 36.2* 34.6*  PLT 228 218 207  LABPROT 25.4* 24.6* 25.5*  INR 2.3* 2.2* 2.3*  CREATININE 2.53* 2.55* 2.62*     Estimated Creatinine Clearance: 26.2 mL/min (A) (by C-G formula based on SCr of 2.62 mg/dL (H)).   Medical History: Past Medical History:  Diagnosis Date   Arrhythmia    atrial fibrillation   CHF (congestive heart failure) (HCC)    Chronic kidney disease    Coronary artery disease    Hyperlipidemia    Hypertension    Myocardial infarct (HCC)     Medications:  Medications Prior to Admission  Medication Sig Dispense Refill Last Dose   albuterol (VENTOLIN HFA) 108 (90 Base) MCG/ACT inhaler Inhale 2 puffs into the lungs every 6 (six) hours as needed. (Patient taking differently: Inhale 2 puffs into the lungs every 6 (six) hours as needed for wheezing or shortness of breath.) 1 each 3 unk   amiodarone (PACERONE) 200 MG tablet Take 1 tablet (200 mg total) by mouth daily. Take 200 mg twice a day x 7 days, then resume a dose of 200 mg daily. (Patient taking differently: Take 200 mg by mouth daily.) 30 tablet 6 11/27/2022 at 0930   furosemide (LASIX) 40 MG tablet Take 1.5 tablets (60 mg total) by mouth daily. 70 tablet 3 11/27/2022 at am   pantoprazole (PROTONIX) 40 MG tablet TAKE 1 TABLET BY MOUTH EVERY DAY (Patient taking differently: Take 40 mg by  mouth daily before breakfast.) 90 tablet 3 11/27/2022 at am   rosuvastatin (CRESTOR) 10 MG tablet TAKE 1 TABLET BY MOUTH EVERY DAY (Patient taking differently: Take 10 mg by mouth at bedtime.) 90 tablet 3 11/26/2022 at pm   sildenafil (REVATIO) 20 MG tablet TAKE 1 TABLET BY MOUTH THREE TIMES DAILY 270 tablet 2 11/27/2022 at am   warfarin (COUMADIN) 2 MG tablet Take 4 mg (2 tabs) every Monday/Wednesday/Friday and 2 mg (1 tab) all other days or as directed by the [facility]. (Patient taking differently: Take 2-4 mg by mouth See admin instructions. Beginning on 11/27/2022, take 4 mg by mouth once a day on Fri/Sun/Mon/Wed and 2 mg on Sat/Tues. Next INR is on 12/03/2022.) 120 tablet 11 11/25/2022 at 1900   enoxaparin (LOVENOX) 40 MG/0.4ML injection Inject 0.4 mLs (40 mg total) into the skin every 12 (twelve) hours. (Patient not taking: Reported on 11/27/2022) 4 mL 0 Not Taking   potassium chloride SA (KLOR-CON M) 20 MEQ tablet Take 1 tablet (20 mEq total) by mouth daily. (Patient not taking: Reported on 11/27/2022) 30 tablet 6 Not Taking   Scheduled:   amiodarone  200 mg Oral Daily   furosemide  60 mg Oral Daily   pantoprazole  40 mg Oral Daily   rosuvastatin  10 mg Oral QHS   sildenafil  20 mg Oral TID   Warfarin -  Pharmacist Dosing Inpatient   Does not apply q1600   Infusions:   cefTRIAXone (ROCEPHIN)  IV 2 g (12/02/22 1651)   vancomycin Stopped (12/02/22 1100)    Assessment: 70 yoM with LVAD admitted with groin abscess. Pt on warfarin PTA (dose '4mg'$  daily except '2mg'$  Tues/Thurs/Sat).  INR therapeutic at 2.3 today. CBC and LDH stable.  Goal of Therapy:  INR 2-2.5  Plan:  Warfarin '2mg'$  PO x1 tonight - will try to resume home dose Daily INR  Arrie Senate, PharmD, BCPS, [facility] Clinical Pharmacist [phone] Please check AMION for all [place] numbers 12/03/2022

## 2022-12-04 ENCOUNTER — Other Ambulatory Visit (HOSPITAL_COMMUNITY): Payer: Self-pay

## 2022-12-04 LAB — CULTURE, BLOOD (ROUTINE X 2)
Culture: NO GROWTH
Culture: NO GROWTH
Special Requests: ADEQUATE
Special Requests: ADEQUATE

## 2022-12-04 LAB — CBC
HCT: 34.4 % — ABNORMAL LOW (ref 39.0–52.0)
Hemoglobin: 10.7 g/dL — ABNORMAL LOW (ref 13.0–17.0)
MCH: 29.6 pg (ref 26.0–34.0)
MCHC: 31.1 g/dL (ref 30.0–36.0)
MCV: 95 fL (ref 80.0–100.0)
Platelets: 204 10*3/uL (ref 150–400)
RBC: 3.62 MIL/uL — ABNORMAL LOW (ref 4.22–5.81)
RDW: 15.8 % — ABNORMAL HIGH (ref 11.5–15.5)
WBC: 4.5 10*3/uL (ref 4.0–10.5)
nRBC: 0 % (ref 0.0–0.2)

## 2022-12-04 LAB — BASIC METABOLIC PANEL
Anion gap: 13 (ref 5–15)
BUN: 31 mg/dL — ABNORMAL HIGH (ref 8–23)
CO2: 23 mmol/L (ref 22–32)
Calcium: 9.1 mg/dL (ref 8.9–10.3)
Chloride: 101 mmol/L (ref 98–111)
Creatinine, Ser: 2.51 mg/dL — ABNORMAL HIGH (ref 0.61–1.24)
GFR, Estimated: 27 mL/min — ABNORMAL LOW (ref >60)
Glucose, Bld: 79 mg/dL (ref 70–99)
Potassium: 4 mmol/L (ref 3.5–5.1)
Sodium: 137 mmol/L (ref 135–145)

## 2022-12-04 LAB — LACTATE DEHYDROGENASE: LDH: 157 U/L (ref 98–192)

## 2022-12-04 LAB — PROTIME-INR
INR: 2.3 — ABNORMAL HIGH (ref 0.8–1.2)
Prothrombin Time: 25.4 seconds — ABNORMAL HIGH (ref 11.4–15.2)

## 2022-12-04 MED ORDER — WARFARIN SODIUM 2 MG PO TABS
2.0000 mg | ORAL_TABLET | Freq: Every day | ORAL | 5 refills | Status: DC
  Filled 2022-12-04: qty 30, 30d supply, fill #0

## 2022-12-04 MED ORDER — AMOXICILLIN-POT CLAVULANATE 500-125 MG PO TABS
1.0000 | ORAL_TABLET | Freq: Two times a day (BID) | ORAL | 0 refills | Status: DC
  Filled 2022-12-04: qty 19, 10d supply, fill #0

## 2022-12-04 MED ORDER — WARFARIN SODIUM 2 MG PO TABS: 2.0000 mg | ORAL_TABLET | Freq: Every day | ORAL | Status: DC

## 2022-12-04 MED ORDER — LINEZOLID 600 MG PO TABS
600.0000 mg | ORAL_TABLET | Freq: Two times a day (BID) | ORAL | 0 refills | Status: DC
  Filled 2022-12-04: qty 19, 10d supply, fill #0

## 2022-12-04 MED ORDER — AMOXICILLIN-POT CLAVULANATE 500-125 MG PO TABS
1.0000 | ORAL_TABLET | Freq: Two times a day (BID) | ORAL | Status: DC
  Administered 2022-12-04: 1 via ORAL
  Filled 2022-12-04: qty 1

## 2022-12-04 MED ORDER — LINEZOLID 600 MG PO TABS
600.0000 mg | ORAL_TABLET | Freq: Two times a day (BID) | ORAL | Status: DC
  Administered 2022-12-04: 600 mg via ORAL
  Filled 2022-12-04: qty 1

## 2022-12-04 NOTE — Progress Notes (Signed)
  Subjective: Patient examined, L groin dressing personally changed Deep wound cultures no growth, wound induration and tenderness resolved. Scant drainage. Wound painted with betadine swab and mepelix pad applied. Will DC home on po antibiotics with VAD clinic followup next week  Objective: Vital signs in last 24 hours: Temp:  [97.6 F (36.4 C)-98.1 F (36.7 C)] 98 F (36.7 C) (03/15 0734) Pulse Rate:  [63-72] 63 (03/15 0548) Cardiac Rhythm: Atrial fibrillation;Ventricular paced (03/15 0700) Resp:  [12-17] 15 (03/15 0734) BP: (89-102)/(59-90) 101/82 (03/15 0734) SpO2:  [97 %-99 %] 98 % (03/15 0734) Weight:  [80.9 kg] 80.9 kg (03/15 0530)  Hemodynamic parameters for last 24 hours:  afebrile  Intake/Output from previous day: 03/14 0701 - 03/15 0700 In: -  Out: 400 [Urine:400] Intake/Output this shift: No intake/output data recorded.       Exam    General- alert and comfortable    Neck- no JVD, no cervical adenopathy palpable, no carotid bruit   Lungs- clear without rales, wheezes   Cor- regular rate and rhythm, normal VAD hum   Abdomen- soft, non-tender, L groin nontender, small chronic L inguinal hernia   Extremities - warm, non-tender, minimal edema   Neuro- oriented, appropriate, no focal weakness    Lab Results: Recent Labs    12/03/22 0010 12/03/22 2347  WBC 4.5 4.5  HGB 11.0* 10.7*  HCT 34.6* 34.4*  PLT 207 204   BMET:  Recent Labs    12/03/22 0010 12/03/22 2347  NA 137 137  K 4.5 4.0  CL 104 101  CO2 25 23  GLUCOSE 98 79  BUN 30* 31*  CREATININE 2.62* 2.51*  CALCIUM 9.0 9.1    PT/INR:  Recent Labs    12/03/22 2347  LABPROT 25.4*  INR 2.3*   ABG    Component Value Date/Time   PHART 7.532 (H) 09/25/2021 0340   HCO3 27.8 09/25/2021 0340   TCO2 29 09/25/2021 0340   ACIDBASEDEF 2.0 09/23/2021 0355   O2SAT 86.5 10/22/2021 0515   CBG (last 3)  No results for input(s): "GLUCAP" in the last 72 hours.  Assessment/Plan: S/P  DC home  on PO antibiotics for outpatient followup   LOS: 7 days    Dahlia Byes 12/04/2022

## 2022-12-04 NOTE — Progress Notes (Addendum)
Advanced Heart Failure VAD Rounding Note   PCP-Cardiologist: Dr. Aundra Dubin   Reason for Admission: Left Groin Abscess   HPI/Interval hx:   Plan to switch to PO linezolid and augmentin today, will complete 10 day course.  Feels good this morning. Denies CP/SOB. No further pain with ambulation.   LVAD INTERROGATION:  HeartMate III LVAD:  Flow 4.6 liters/min, speed 5300, power 3.9, PI 3.1 .  x1 PI events today  Objective:    Vitals:   12/03/22 2311 12/04/22 0548  BP: 97/70 (!) 102/90  Pulse:  63  Resp: 14 15  Temp: 98 F (36.7 C) 98 F (36.7 C)  SpO2: 99% 98%   MAP 80s  Physical Exam  General:  Well appearing. No resp difficulty HEENT: Normal Neck: supple. JVP ~7. Carotids 2+ bilat; no bruits. No lymphadenopathy or thyromegaly appreciated. Cor: Mechanical heart sounds with LVAD hum present. Lungs: Clear Abdomen: soft, nontender, nondistended. No hepatosplenomegaly. No bruits or masses. Good bowel sounds. Driveline: C/D/I; securement device intact and driveline incorporated Extremities: no cyanosis, clubbing, rash, edema. Mepilex to L groin, old drainage to it. Swelling decreased.  Neuro: alert & orientedx3, cranial nerves grossly intact. moves all 4 extremities w/o difficulty. Affect pleasant   Telemetry   A fib 70s intermittent PVCs (Personally reviewed)    EKG   No new EKG to review  Labs    Basic Metabolic Panel: Recent Labs  Lab 11/27/22 2304 11/28/22 0014 11/30/22 0020 12/01/22 0017 12/02/22 0012 12/03/22 0010 12/03/22 2347  NA 138   < > 134* 136 134* 137 137  K 3.9   < > 4.2 4.4 4.1 4.5 4.0  CL 106   < > 100 98 99 104 101  CO2 27   < > 28 25 23 25 23   GLUCOSE 104*   < > 93 116* 87 98 79  BUN 50*   < > 32* 29* 26* 30* 31*  CREATININE 3.00*   < > 2.57* 2.53* 2.55* 2.62* 2.51*  CALCIUM 9.2   < > 8.9 9.1 9.1 9.0 9.1  MG 2.2  --   --   --   --   --   --    < > = values in this interval not displayed.   Liver Function Tests: Recent Labs  Lab  11/27/22 2304  AST 29  ALT 31  ALKPHOS 136*  BILITOT 1.1  PROT 7.3  ALBUMIN 3.9   CBC: Recent Labs  Lab 11/27/22 2304 11/28/22 0014 11/30/22 0020 12/01/22 0017 12/02/22 0012 12/03/22 0010 12/03/22 2347  WBC 4.7   < > 4.0 4.4 4.1 4.5 4.5  NEUTROABS 2.6  --   --   --   --   --   --   HGB 12.0*   < > 11.5* 12.1* 11.6* 11.0* 10.7*  HCT 37.7*   < > 34.8* 36.8* 36.2* 34.6* 34.4*  MCV 93.1   < > 92.3 93.2 93.3 94.5 95.0  PLT 199   < > 205 228 218 207 204   < > = values in this interval not displayed.   BNP: BNP (last 3 results) Recent Labs    07/02/22 1642  BNP 501.5*   Coagulation Studies: Recent Labs    12/02/22 0012 12/03/22 0010 12/03/22 2347  LABPROT 24.6* 25.5* 25.4*  INR 2.2* 2.3* 2.3*   Other results: pending  Imaging    No results found.  Patient Profile:   72 y.o. with history of CAD, HTN, atrial fibrillation, chronic systolic  CHF s/p HMIII LVAD and CKD IV, direct admitted for treatment of left groin abscess.   Assessment/Plan:   1. Left hemiscrotum abscess - Blood cx pending - CT pelvis w/ 2.2x1.5cm superficial left hemiscrotum abscess; on exam very tender to palpation with diffuse erythema and some induration.  - IR evaluated 3/9 and felt there was no evidence of formed abscess, just inflammation/phlegmon and no role for aspiration at this time.  - General surgery following (signed off 3/12): abscess probed; continue warm compresses. Continue rocephin/vanc. General surgery evaluated, no need for surgical drainage, ok for PO abx. Area spontaneously draining and cellulitis improving. Switching to PO linezolid and Augmentin .  - INR 2.3  Discussed with pharmacy.  2. Chronic systolic CHF:  Long-standing cardiomyopathy.  [place].  Echo in 12/22 with EF < 20%, severe LV dilation, restrictive diastolic function, moderate RV dysfunction, moderate MR, mod-severe TR. Cause of cardiomyopathy is uncertain.  He has a history of CAD, but I do not  think that the described CAD from the past could explain his cardiomyopathy, but CAD could have progressed.  He was admitted in 12/22 with cardiogenic shock and Impella 5.5. s/p HM III VAD on 09/17/22.  Post-op course complicated by AKI and RV failure. He was on HD for several months but is now off.   - Continue Lasix 60 mg daily.  - Continue sildenafil 20 tid for RV.   - On Warfarin. INR goal 2-2.5.   - INR 2.3 - LDH stable.  - Renal function stable.     3. Tricuspid regurgitation:  Tricuspid repair not done at time of VAD due to proximity of ICD wires and hypotension during surgery.  He has severe TR.   4. Atrial fibrillation:  - Continue amiodarone 200 mg daily. May need to stop and switch to Toprol XL for rate control. He does have PVCs.   5. CKD stage IV: Suspect intra-op hypotension at the time of LVAD placement led to development of ATN => urine sediment looked like ATN per renal. Had initial CVVH and transitioned over to HD. He is now off HD with creatinine stable, most recently 2.8.   -Stable at 2.5 today.   6. CAD: History of PCI to OM1 in 2007 and RCA in 2013.  No CP or ACs.  - Deferred cardiac cath in 12/22 due to AKI and plan for VAD - Continue Crestor.    Plan to d/c today. Transitioning to PO abx. To come from [facility].   I reviewed the LVAD parameters from today, and compared the results to the patient's prior recorded data.  No programming changes were made.  The LVAD is functioning within specified parameters.  The patient performs LVAD self-test daily.  LVAD interrogation was negative for any significant power changes, alarms or PI events/speed drops.  LVAD equipment check completed and is in good working order.  Back-up equipment present.   LVAD education done on emergency procedures and precautions and reviewed exit site care.  Earnie Larsson AGACNP-BC  Advanced Heart Failure Team   Patient seen with NP, agree with the above note.   No complaints this morning.  Afebrile.    General: Well appearing this am. NAD.  HEENT: Normal. Neck: Supple, JVP 8-9 cm. Carotids OK.  Cardiac:  Mechanical heart sounds with LVAD hum present.  Lungs:  CTAB, normal effort.  Abdomen:  NT, ND, no HSM. No bruits or masses. +BS  LVAD exit site: Well-healed and incorporated. Dressing dry and intact. No  erythema or drainage. Stabilization device present and accurately applied. Driveline dressing changed daily per sterile technique. Extremities:  Warm and dry. No cyanosis, clubbing, rash, or edema.  Neuro:  Alert & oriented x 3. Cranial nerves grossly intact. Moves all 4 extremities w/o difficulty. Affect pleasant    Can transition to linezolid/Augmentin for groin abscess/cellulitis.   LVAD parameters stable.    He asks about ICD generator change.  He does have h/o VT treated with ATP so think this would be reasonable.   OK for home today on prior home medications + course of abx.   Loralie Champagne 12/04/2022 10:17 AM

## 2022-12-04 NOTE — Discharge Summary (Signed)
Advanced Heart Failure Team  Discharge Summary   Patient ID: Isaac [name] MRN: [id number], DOB/AGE: 1951/05/17 72 y.o. Admit date: 11/27/2022 D/C date:     12/04/2022   Primary Discharge Diagnoses:  Left hemiscrotum abscesses  Secondary Discharge Diagnoses:  Chronic systolic CHF Tricuspid regurgitation Atrial fibrillation CKD stage IV CAD  Hospital Course:  Mr. Penders is a 72 y.o. with history of CAD, HTN, atrial fibrillation, chronic systolic CHF s/p HMIII LVAD and CKD IV, direct admitted for treatment of left groin abscess.   Mr. Seme contacted the [facility] clinic 3/4 with concerns from L groin pain and swelling. Admitted for imaging and further w/u. CT pelvis w/ 2.2x1.5cm superficial left hemiscrotum abscess. During admission he was started on broad spectrum abx. IR saw, no evidence of formed abscess, just inflammation/phlegmon and no role for aspiration at this time. General surgery saw, abscess probed, no need for surgical drainage. Patient completed 6 days on IV abx, ok to switch to PO.   Patient will d/c on PO Augmentin and linezolid for 10 days (per ID recs). Has f/u in VAD clinic 3/20. Dr. Aundra Dubin evaluated and deemed appropriate for discharge.    See below for detailed problem list:  1. Left hemiscrotum abscess - Blood cx NGT5D - CT pelvis w/ 2.2x1.5cm superficial left hemiscrotum abscess; on exam very tender to palpation with diffuse erythema and some induration.  - IR evaluated 3/9 and felt there was no evidence of formed abscess, just inflammation/phlegmon and no role for aspiration at this time.  - General surgery following (signed off 3/12): abscess probed; continue warm compresses. Continue rocephin/vanc. General surgery evaluated, no need for surgical drainage, ok for PO abx. Area spontaneously draining and cellulitis improving. Switching to PO linezolid and Augmentin .  - INR 2.3  Discussed with pharmacy. 2. Chronic systolic CHF:  Long-standing cardiomyopathy.  [place].  Echo in 12/22 with EF < 20%, severe LV dilation, restrictive diastolic function, moderate RV dysfunction, moderate MR, mod-severe TR. Cause of cardiomyopathy is uncertain.  He has a history of CAD, but I do not think that the described CAD from the past could explain his cardiomyopathy, but CAD could have progressed.  He was admitted in 12/22 with cardiogenic shock and Impella 5.5. s/p HM III VAD on 09/17/22.  Post-op course complicated by AKI and RV failure. He was on HD for several months but is now off.   - Continue Lasix 60 mg daily.  - Continue sildenafil 20 tid for RV.   - On Warfarin. INR goal 2-2.5.   - INR 2.3 - LDH stable.  - Renal function stable.    3. Tricuspid regurgitation:  Tricuspid repair not done at time of VAD due to proximity of ICD wires and hypotension during surgery.  He has severe TR.  4. Atrial fibrillation:  - Continue amiodarone 200 mg daily. May need to stop and switch to Toprol XL for rate control. He does have PVCs.  5. CKD stage IV: Suspect intra-op hypotension at the time of LVAD placement led to development of ATN => urine sediment looked like ATN per renal. Had initial CVVH and transitioned over to HD. He is now off HD with creatinine stable, most recently 2.8.   -Stable at 2.5 today.  6. CAD: History of PCI to OM1 in 2007 and RCA in 2013.  No CP or ACs.  - Deferred cardiac cath in 12/22 due to AKI and plan for VAD - Continue Crestor.    LVAD Interrogation  HM II: Speed: 5300  Flow:4.6   PI:3.1  Power: 3.9  Back-up speed: 5000    Discharge Weight Range: 80.9 kg Discharge Vitals: Blood pressure 101/82, pulse 63, temperature 98 F (36.7 C), temperature source Oral, resp. rate 15, height 5\' 10"  (1.778 m), weight 80.9 kg, SpO2 98 %.  Labs: Lab Results  Component Value Date   WBC 4.5 12/03/2022   HGB 10.7 (L) 12/03/2022   HCT 34.4 (L) 12/03/2022   MCV 95.0 12/03/2022   PLT 204 12/03/2022    Recent Labs  Lab 11/27/22 2304 11/28/22 0014  12/03/22 2347  NA 138   < > 137  K 3.9   < > 4.0  CL 106   < > 101  CO2 27   < > 23  BUN 50*   < > 31*  CREATININE 3.00*   < > 2.51*  CALCIUM 9.2   < > 9.1  PROT 7.3  --   --   BILITOT 1.1  --   --   ALKPHOS 136*  --   --   ALT 31  --   --   AST 29  --   --   GLUCOSE 104*   < > 79   < > = values in this interval not displayed.   Lab Results  Component Value Date   CHOL 94 09/08/2021   HDL 25 (L) 09/08/2021   LDLCALC 58 09/08/2021   TRIG 54 09/08/2021   BNP (last 3 results) Recent Labs    07/02/22 1642  BNP 501.5*    ProBNP (last 3 results) No results for input(s): "PROBNP" in the last 8760 hours.   Diagnostic Studies/Procedures   CT pelvis w/ 2.2x1.5cm superficial left hemiscrotum abscess   Discharge Medications   Allergies as of 12/04/2022       Reactions   Mushroom Extract Complex Nausea And Vomiting   Doxycycline Nausea And Vomiting   Neosporin [neomycin-bacitracin Zn-polymyx] Hives   Tape Other (See Comments)   Some tapes/dressings can irritate the skin        Medication List     STOP taking these medications    enoxaparin 40 MG/0.4ML injection Commonly known as: LOVENOX   potassium chloride SA 20 MEQ tablet Commonly known as: KLOR-CON M       TAKE these medications    albuterol 108 (90 Base) MCG/ACT inhaler Commonly known as: VENTOLIN HFA Inhale 2 puffs into the lungs every 6 (six) hours as needed. What changed: reasons to take this   amiodarone 200 MG tablet Commonly known as: PACERONE Take 1 tablet (200 mg total) by mouth daily. Take 200 mg twice a day x 7 days, then resume a dose of 200 mg daily. What changed: additional instructions   amoxicillin-clavulanate 500-125 MG tablet Commonly known as: AUGMENTIN Take 1 tablet by mouth every 12 (twelve) hours.   furosemide 40 MG tablet Commonly known as: Lasix Take 1.5 tablets (60 mg total) by mouth daily.   linezolid 600 MG tablet Commonly known as: ZYVOX Take 1 tablet (600 mg  total) by mouth every 12 (twelve) hours.   pantoprazole 40 MG tablet Commonly known as: PROTONIX TAKE 1 TABLET BY MOUTH EVERY DAY What changed: when to take this   rosuvastatin 10 MG tablet Commonly known as: CRESTOR TAKE 1 TABLET BY MOUTH EVERY DAY What changed: when to take this   sildenafil 20 MG tablet Commonly known as: REVATIO TAKE 1 TABLET BY MOUTH THREE TIMES DAILY   warfarin 2  MG tablet Commonly known as: COUMADIN Take as directed. If you are unsure how to take this medication, talk to your nurse or doctor. Original instructions: Take 1 tablet (2 mg total) by mouth daily at 4 PM. What changed:  how much to take how to take this when to take this additional instructions               Discharge Care Instructions  (From admission, onward)           Start     Ordered   12/04/22 0000  Discharge wound care:       Comments: Mepilex provided. To change as needed, ok to leave open to air too   12/04/22 1041            Disposition   The patient will be discharged in stable condition to home. Discharge Instructions     (HEART FAILURE PATIENTS) Call MD:  Anytime you have any of the following symptoms: 1) 3 pound weight gain in 24 hours or 5 pounds in 1 week 2) shortness of breath, with or without a dry hacking cough 3) swelling in the hands, feet or stomach 4) if you have to sleep on extra pillows at night in order to breathe.   Complete by: As directed    Diet - low sodium heart healthy   Complete by: As directed    Discharge wound care:   Complete by: As directed    Mepilex provided. To change as needed, ok to leave open to air too   Increase activity slowly   Complete by: As directed           Duration of Discharge Encounter: Greater than 35 minutes   Signed, Earnie Larsson AGACNP-BC  12/04/2022, 10:41 AM

## 2022-12-04 NOTE — Progress Notes (Signed)
[place] for warfarin Indication:  LVAD (HM3) and hx of AF  Allergies  Allergen Reactions   Mushroom Extract Complex Nausea And Vomiting   Doxycycline Nausea And Vomiting   Neosporin [Neomycin-Bacitracin Zn-Polymyx] Hives   Tape Other (See Comments)    Some tapes/dressings can irritate the skin    Patient Measurements: Height: 5\' 10"  (177.8 cm) Weight: 80.9 kg (178 lb 5.6 oz) IBW/kg (Calculated) : 73  Vital Signs: Temp: 98 F (36.7 C) (03/15 0734) Temp Source: Oral (03/15 0734) BP: 101/82 (03/15 0734) Pulse Rate: 63 (03/15 0548)  Labs: Recent Labs    12/02/22 0012 12/03/22 0010 12/03/22 2347  HGB 11.6* 11.0* 10.7*  HCT 36.2* 34.6* 34.4*  PLT 218 207 204  LABPROT 24.6* 25.5* 25.4*  INR 2.2* 2.3* 2.3*  CREATININE 2.55* 2.62* 2.51*     Estimated Creatinine Clearance: 28.3 mL/min (A) (by C-G formula based on SCr of 2.51 mg/dL (H)).   Medical History: Past Medical History:  Diagnosis Date   Arrhythmia    atrial fibrillation   CHF (congestive heart failure) (HCC)    Chronic kidney disease    Coronary artery disease    Hyperlipidemia    Hypertension    Myocardial infarct (HCC)     Medications:  Medications Prior to Admission  Medication Sig Dispense Refill Last Dose   albuterol (VENTOLIN HFA) 108 (90 Base) MCG/ACT inhaler Inhale 2 puffs into the lungs every 6 (six) hours as needed. (Patient taking differently: Inhale 2 puffs into the lungs every 6 (six) hours as needed for wheezing or shortness of breath.) 1 each 3 unk   amiodarone (PACERONE) 200 MG tablet Take 1 tablet (200 mg total) by mouth daily. Take 200 mg twice a day x 7 days, then resume a dose of 200 mg daily. (Patient taking differently: Take 200 mg by mouth daily.) 30 tablet 6 11/27/2022 at 0930   furosemide (LASIX) 40 MG tablet Take 1.5 tablets (60 mg total) by mouth daily. 70 tablet 3 11/27/2022 at am   pantoprazole (PROTONIX) 40 MG tablet TAKE 1 TABLET BY MOUTH  EVERY DAY (Patient taking differently: Take 40 mg by mouth daily before breakfast.) 90 tablet 3 11/27/2022 at am   rosuvastatin (CRESTOR) 10 MG tablet TAKE 1 TABLET BY MOUTH EVERY DAY (Patient taking differently: Take 10 mg by mouth at bedtime.) 90 tablet 3 11/26/2022 at pm   sildenafil (REVATIO) 20 MG tablet TAKE 1 TABLET BY MOUTH THREE TIMES DAILY 270 tablet 2 11/27/2022 at am   warfarin (COUMADIN) 2 MG tablet Take 4 mg (2 tabs) every Monday/Wednesday/Friday and 2 mg (1 tab) all other days or as directed by the [facility]. (Patient taking differently: Take 2-4 mg by mouth See admin instructions. Beginning on 11/27/2022, take 4 mg by mouth once a day on Fri/Sun/Mon/Wed and 2 mg on Sat/Tues. Next INR is on 12/03/2022.) 120 tablet 11 11/25/2022 at 1900   enoxaparin (LOVENOX) 40 MG/0.4ML injection Inject 0.4 mLs (40 mg total) into the skin every 12 (twelve) hours. (Patient not taking: Reported on 11/27/2022) 4 mL 0 Not Taking   potassium chloride SA (KLOR-CON M) 20 MEQ tablet Take 1 tablet (20 mEq total) by mouth daily. (Patient not taking: Reported on 11/27/2022) 30 tablet 6 Not Taking   Scheduled:   amiodarone  200 mg Oral Daily   amoxicillin-clavulanate  1 tablet Oral Q12H   furosemide  60 mg Oral Daily   linezolid  600 mg Oral Q12H  pantoprazole  40 mg Oral Daily   rosuvastatin  10 mg Oral QHS   sildenafil  20 mg Oral TID   warfarin  2 mg Oral q1600   Warfarin - Pharmacist Dosing Inpatient   Does not apply q1600   Infusions:     Assessment: 65 yoM with LVAD admitted with groin abscess. Pt on warfarin PTA (dose 4mg  daily except 2mg  Tues/Thurs/Sat).  INR therapeutic at 2.3 today. CBC and LDH stable. Changing ABX at DC to augemntin and zyvox - which can elevate INR  Will empirically decrease dose and follow up as out pt   Goal of Therapy:  INR 2-2.5  Plan:  Warfarin 2mg  PO daily  Check INR in clinic next week    Bonnita Nasuti Pharm.D. CPP, BCPS Clinical  Pharmacist [phone] 12/04/2022 8:57 AM   Please check AMION for all [place] numbers 12/04/2022

## 2022-12-04 NOTE — Plan of Care (Signed)
  Problem: Education: Goal: Patient will understand all VAD equipment and how it functions Outcome: Progressing Goal: Patient will be able to verbalize current INR target range and antiplatelet therapy for discharge home Outcome: Progressing   Problem: Cardiac: Goal: LVAD will function as expected and patient will experience no clinical alarms Outcome: Progressing   Problem: Education: Goal: Knowledge of General Education information will improve Description: Including pain rating scale, medication(s)/side effects and non-pharmacologic comfort measures Outcome: Progressing   Problem: Health Behavior/Discharge Planning: Goal: Ability to manage health-related needs will improve Outcome: Progressing   Problem: Clinical Measurements: Goal: Ability to maintain clinical measurements within normal limits will improve Outcome: Progressing Goal: Will remain free from infection Outcome: Progressing Goal: Diagnostic test results will improve Outcome: Progressing Goal: Respiratory complications will improve Outcome: Progressing Goal: Cardiovascular complication will be avoided Outcome: Progressing   Problem: Activity: Goal: Risk for activity intolerance will decrease Outcome: Progressing   Problem: Nutrition: Goal: Adequate nutrition will be maintained Outcome: Progressing   Problem: Coping: Goal: Level of anxiety will decrease Outcome: Progressing   Problem: Elimination: Goal: Will not experience complications related to bowel motility Outcome: Progressing Goal: Will not experience complications related to urinary retention Outcome: Progressing   Problem: Pain Managment: Goal: General experience of comfort will improve Outcome: Progressing   Problem: Safety: Goal: Ability to remain free from injury will improve Outcome: Progressing   Problem: Skin Integrity: Goal: Risk for impaired skin integrity will decrease Outcome: Progressing   

## 2022-12-04 NOTE — Progress Notes (Signed)
LVAD Coordinator Rounding Note:  Admitted 11/28/22 to Heart Failure service for left groin abscess. Pt was seen by general surgery at [facility] and informed that he may need surgery. Direct admitted pt Friday evening for further testing per [facility] here in [place].  HM III LVAD implanted on 09/17/21 by Dr Cyndia Bent under destination therapy criteria. 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   Pt reports he is feeling great this morning. Left groin assessed, redness improved. Pt reports no pain. Area was not tender to palpation. Pt is ready to go home today.  Vital signs: Temp: 98 HR: 71 A paced Doppler Pressure: 84 Auto Cuff BP: 101/82 (82) O2 Sat: 98% on RA Wt: 179>178.5>178.8>178.3 lbs    LVAD interrogation reveals:  Speed: 5300 Flow: 4.5 Power: 4w PI: 3.3 Hematocrit: 34  Alarms: none Events: 1 today  Fixed speed: 5300 Low speed limit: 5000  Drive Line:  Existing VAD dressing C/D/I with anchor intact and accurately applied. Weekly dressing changes per VAD coordinator, or bedside nurse. Next dressing change due: 12/04/22. Wife will change at home today.  Labs:  LDH trend: 141>148>153>169>157  INR trend: 2.6>2.3>2.2>2.3  Anticoagulation Plan: -INR Goal: 2.0 - 2.5   Device: - [employer] dual - AV paced 60 -Therapies: on  Infection:  11/29/22>> blood cultures >>no growth 4 days 12/02/22>> groin abscess culture>>no growth 24 hrs  Plan/Recommendations:  Page VAD coordinator for drive line or equipment concerns 2. Weekly dressing changes. 3. Pt ok to d/c home today. Hosp f/u next Wednesday 3/20.  [name] RN [place] Coordinator  Office: [phone]  24/7 Pager: [phone]

## 2022-12-05 LAB — AEROBIC CULTURE W GRAM STAIN (SUPERFICIAL SPECIMEN)
Culture: NO GROWTH
Gram Stain: NONE SEEN

## 2022-12-07 ENCOUNTER — Telehealth (HOSPITAL_COMMUNITY): Payer: Self-pay | Admitting: *Deleted

## 2022-12-07 ENCOUNTER — Ambulatory Visit (INDEPENDENT_AMBULATORY_CARE_PROVIDER_SITE_OTHER): Payer: Medicare HMO

## 2022-12-07 ENCOUNTER — Other Ambulatory Visit (HOSPITAL_COMMUNITY): Payer: Self-pay | Admitting: *Deleted

## 2022-12-07 DIAGNOSIS — I5022 Chronic systolic (congestive) heart failure: Secondary | ICD-10-CM | POA: Diagnosis not present

## 2022-12-07 DIAGNOSIS — Z95811 Presence of heart assist device: Secondary | ICD-10-CM

## 2022-12-07 MED ORDER — SILDENAFIL CITRATE 20 MG PO TABS: 20.0000 mg | ORAL_TABLET | Freq: Three times a day (TID) | ORAL | 11 refills | Status: DC

## 2022-12-07 NOTE — Telephone Encounter (Addendum)
Received call from pt and his wife reporting diarrhea over the weekend with dual antibiotic use. He did not take either antibiotic yesterday due to diarrhea. Denies nausea, vomiting, abdominal pain, fever, or chills. Pt and wife feel like diarrhea is related to Linezolid as pt has taken Augmentin before without GI issues.   Discussed with Dr Aundra Dubin- requested we speak with ID for recommendations. Recent wound culture negative. Discussed pt with Janene Madeira NP with ID: If he has > 3 watery stools daily (not just smears with coughing or such) then he needs CDiff testing in clinic. If not watery and just smearing, may try Imodium OTC while completing antibiotics.   Discussed with pt patient and his wife. Both strongly feel it is the Linezolid so he says he will not take that anymore- but will continue Augmentin. Discussed need to let us know if diarrhea continues, or symptoms worsen not on Linezolid. Will see pt in clinic on Wednesday for previously scheduled appt.     Per pt's wife they are unable to fill Sildenafil prescription as pharmacy told them this would cost $3,000 out of pocket. Discussed with Lauren PharmD- pt must use GoodRx card for this prescription. Updated prescription sent to [facility] pharmacy with new GoodRx information included. GoodRx card emailed to pt's wife as requested.    Isaac Monte RN [place] Coordinator  Office: [phone]  24/7 Pager: [phone]

## 2022-12-07 NOTE — Addendum Note (Signed)
Addended by: Emerson Monte B on: 12/07/2022 11:08 AM   Modules accepted: Orders

## 2022-12-07 NOTE — Telephone Encounter (Signed)
Received call from pt and his wife reporting diarrhea over the weekend with dual antibiotic use. He did not take either antibiotic yesterday due to diarrhea. Denies nausea, vomiting, abdominal pain, fever, or chills. Pt and wife feel like diarrhea is related to Linezolid as pt has taken Augmentin before without GI issues.    Discussed with Dr Aundra Dubin- requested we speak with ID for recommendations. Recent wound culture negative. Discussed pt with Janene Madeira NP with ID: If he has > 3 watery stools daily (not just smears with coughing or such) then he needs CDiff testing in clinic. If not watery and just smearing, may try Imodium OTC while completing antibiotics.    Discussed with pt patient and his wife. Both strongly feel it is the Linezolid so he says he will not take that anymore- but will continue Augmentin. Discussed need to let us know if diarrhea continues, or symptoms worsen not on Linezolid. Will see pt in clinic on Wednesday for previously scheduled appt.        Per pt's wife they are unable to fill Sildenafil prescription as pharmacy told them this would cost $3,000 out of pocket. Discussed with Lauren PharmD- pt must use GoodRx card for this prescription. Updated prescription sent to [facility] with new GoodRx information included. GoodRx card emailed to pt's wife as requested.     Emerson Monte RN [place] Coordinator  Office: [phone]  24/7 Pager: [phone]

## 2022-12-08 ENCOUNTER — Other Ambulatory Visit (HOSPITAL_COMMUNITY): Payer: Self-pay | Admitting: *Deleted

## 2022-12-08 DIAGNOSIS — Z95811 Presence of heart assist device: Secondary | ICD-10-CM

## 2022-12-08 DIAGNOSIS — Z7901 Long term (current) use of anticoagulants: Secondary | ICD-10-CM

## 2022-12-09 ENCOUNTER — Ambulatory Visit (HOSPITAL_COMMUNITY): Payer: Self-pay | Admitting: Pharmacist

## 2022-12-09 ENCOUNTER — Ambulatory Visit (HOSPITAL_COMMUNITY)
Admission: RE | Admit: 2022-12-09 | Discharge: 2022-12-09 | Disposition: A | Discharge status: Home / Self Care | Payer: Medicare HMO | Source: Ambulatory Visit | Attending: Cardiology | Admitting: Cardiology

## 2022-12-09 VITALS — BP 84/0 | HR 78 | Ht 69.0 in | Wt 190.2 lb

## 2022-12-09 DIAGNOSIS — Z7901 Long term (current) use of anticoagulants: Secondary | ICD-10-CM | POA: Diagnosis not present

## 2022-12-09 DIAGNOSIS — Z955 Presence of coronary angioplasty implant and graft: Secondary | ICD-10-CM | POA: Diagnosis not present

## 2022-12-09 DIAGNOSIS — I9589 Other hypotension: Secondary | ICD-10-CM | POA: Diagnosis not present

## 2022-12-09 DIAGNOSIS — R197 Diarrhea, unspecified: Secondary | ICD-10-CM | POA: Insufficient documentation

## 2022-12-09 DIAGNOSIS — Z79899 Other long term (current) drug therapy: Secondary | ICD-10-CM

## 2022-12-09 DIAGNOSIS — Z95811 Presence of heart assist device: Secondary | ICD-10-CM

## 2022-12-09 DIAGNOSIS — I2510 Atherosclerotic heart disease of native coronary artery without angina pectoris: Secondary | ICD-10-CM | POA: Insufficient documentation

## 2022-12-09 DIAGNOSIS — R946 Abnormal results of thyroid function studies: Secondary | ICD-10-CM | POA: Diagnosis not present

## 2022-12-09 DIAGNOSIS — N184 Chronic kidney disease, stage 4 (severe): Secondary | ICD-10-CM | POA: Insufficient documentation

## 2022-12-09 DIAGNOSIS — R7989 Other specified abnormal findings of blood chemistry: Secondary | ICD-10-CM | POA: Diagnosis not present

## 2022-12-09 DIAGNOSIS — I5022 Chronic systolic (congestive) heart failure: Secondary | ICD-10-CM | POA: Diagnosis not present

## 2022-12-09 DIAGNOSIS — I132 Hypertensive heart and chronic kidney disease with heart failure and with stage 5 chronic kidney disease, or end stage renal disease: Secondary | ICD-10-CM | POA: Diagnosis not present

## 2022-12-09 DIAGNOSIS — N179 Acute kidney failure, unspecified: Secondary | ICD-10-CM | POA: Insufficient documentation

## 2022-12-09 LAB — CUP PACEART REMOTE DEVICE CHECK
Battery Remaining Longevity: 10 mo
Battery Remaining Percentage: 11 %
Brady Statistic RA Percent Paced: 0 %
Brady Statistic RV Percent Paced: 6 %
Date Time Interrogation Session: 20240318031100
HighPow Impedance: 53 Ohm
Implantable Lead Connection Status: 753985
Implantable Lead Connection Status: 753985
Implantable Lead Implant Date: 20140205
Implantable Lead Implant Date: 20140205
Implantable Lead Location: 753859
Implantable Lead Location: 753860
Implantable Lead Model: 296
Implantable Lead Model: 4470
Implantable Lead Serial Number: 727876
Implantable Pulse Generator Implant Date: 20140205
Lead Channel Impedance Value: 355 Ohm
Lead Channel Impedance Value: 421 Ohm
Lead Channel Pacing Threshold Amplitude: 1.5 V
Lead Channel Pacing Threshold Amplitude: 3.4 V
Lead Channel Pacing Threshold Pulse Width: 0.5 ms
Lead Channel Pacing Threshold Pulse Width: 2 ms
Lead Channel Setting Pacing Amplitude: 3.5 V
Lead Channel Setting Pacing Pulse Width: 0.5 ms
Lead Channel Setting Sensing Sensitivity: 0.6 mV
Pulse Gen Serial Number: 113490
Zone Setting Status: 755011

## 2022-12-09 LAB — BASIC METABOLIC PANEL
Anion gap: 9 (ref 5–15)
BUN: 30 mg/dL — ABNORMAL HIGH (ref 8–23)
CO2: 23 mmol/L (ref 22–32)
Calcium: 9.1 mg/dL (ref 8.9–10.3)
Chloride: 106 mmol/L (ref 98–111)
Creatinine, Ser: 2.75 mg/dL — ABNORMAL HIGH (ref 0.61–1.24)
GFR, Estimated: 24 mL/min — ABNORMAL LOW (ref >60)
Glucose, Bld: 91 mg/dL (ref 70–99)
Potassium: 3.9 mmol/L (ref 3.5–5.1)
Sodium: 138 mmol/L (ref 135–145)

## 2022-12-09 LAB — CBC
HCT: 35.4 % — ABNORMAL LOW (ref 39.0–52.0)
Hemoglobin: 11.1 g/dL — ABNORMAL LOW (ref 13.0–17.0)
MCH: 29.8 pg (ref 26.0–34.0)
MCHC: 31.4 g/dL (ref 30.0–36.0)
MCV: 95.2 fL (ref 80.0–100.0)
Platelets: 200 10*3/uL (ref 150–400)
RBC: 3.72 MIL/uL — ABNORMAL LOW (ref 4.22–5.81)
RDW: 15.9 % — ABNORMAL HIGH (ref 11.5–15.5)
WBC: 4.1 10*3/uL (ref 4.0–10.5)
nRBC: 0 % (ref 0.0–0.2)

## 2022-12-09 LAB — PROTIME-INR
INR: 1.9 — ABNORMAL HIGH (ref 0.8–1.2)
Prothrombin Time: 21.3 seconds — ABNORMAL HIGH (ref 11.4–15.2)

## 2022-12-09 LAB — TSH: TSH: 4.992 u[IU]/mL — ABNORMAL HIGH (ref 0.350–4.500)

## 2022-12-09 LAB — LACTATE DEHYDROGENASE: LDH: 185 U/L (ref 98–192)

## 2022-12-09 LAB — T4, FREE: Free T4: 1.48 ng/dL — ABNORMAL HIGH (ref 0.61–1.12)

## 2022-12-09 NOTE — Progress Notes (Addendum)
Patient presents for hosp f/u in [facility] with alone. Denies issues/concerns with VAD equipment or drive line.   Denies falls, and signs of bleeding. Pt was recently admitted to the hospital for a left groin abscess. The wound culture was negative. Pt has been taking Linelizolid and Augmentin. Pt developed severe diarrhea and stopped the Linelizolid 2 days ago. He states that the diarrhea is less.   Reports he has been taking Lasix 60 mg daily. Pt tells me today that he is not taking potassium right now.  Pt is currently on Amiodarone 200 mg daily. His TSH was  elevated along with slight elevation in T4. Pt was referred over to Endocrinology for thyroid workup per Dr Aundra Dubin. Per epic Endo documented they were unable to schedule an appt with pt. Pt states that he did not have any missed calls from Endo. I have redrawn the thyroid panels today.   Pt saw Dr Caryl Comes to discuss generator change out as he has less than 6 months on his ICD. Per DR Caryl Comes note "Will have to have discussions with heart failure regarding device generator replacement." Dr Aundra Dubin d/w pt today at length. Pt is undecided how he would like to proceed but would like to go home and d/w his wife [name]. He will call the VAD office if they decide to proceed with gen change.   Vital Signs:  Doppler Pressure: 84 Automatc BP: 91/74 (84) HR: 78  SPO2: 99% on RA   Weight: 190.2 lb w/ eqt Last weight: 187.8 lb w/eqt   VAD Indication: Destination Therapy due to age and kidney disease   LVAD assessment:  HM III: Speed: 5300 rpms Flow: 4.7 Power: 3.9 w    PI: 3.2 Alarms: none Events: rare PI events  Fixed speed: 5300 Low speed limit: 5000  Primary controller: back up battery due for replacement in 24 months Secondary controller:  back up battery due for replacement in 19 months   I reviewed the LVAD parameters from today and compared the results to the patient's prior recorded data. LVAD interrogation was NEGATIVE for  significant power changes, NEGATIVE for clinical alarms and STABLE for PI events/speed drops. No programming changes were made and pump is functioning within specified parameters. Pt is performing daily controller and system monitor self tests along with completing weekly and monthly maintenance for LVAD equipment.   LVAD equipment check completed and is in good working order. Back-up equipment present. Charged back up battery and performed self-test on equipment.    Annual Equipment Maintenance on UBC/PM was performed on 09/2022.   Exit Site Care: Wife has been performing weekly dressing changes using weekly dressings. Drive line anchor correctly applied.  Pt given 8 weekly kits and 8 anchors for home use.  Device:  [employer] dual  Therapies: on Pacing: AV paced 60 >> increased to 70  Last check: 05/21/22 in [facility]    BP & Labs:  Doppler 84 - Doppler is reflecting Modified systolic   Hgb [id number] - No S/S of bleeding. Specifically denies melena/BRBPR or nosebleeds.   LDH stable at 185 with established baseline of 200 - 300. Denies tea-colored urine. No power elevations noted on interrogation.    Patient Instructions:   Take Augmentin for 2 more days and then stop, call VAD office if diarrhea persists after stopping Augmentin Coumadin dosing per Lauren PharmD Return to [facility] in 2 months.   [name] RN [place] Coordinator  Office: [phone]  24/7 Pager: [phone]  Cardiology: Dr. Aundra Dubin PCP: [name], MD  Follow up for Heart Failure/LVAD: 72 y.o. with history of CAD, HTN, atrial fibrillation, and chronic systolic CHF returns for followup of CHF/LVAD.   Patient has had a long-standing cardiomyopathy, EF had been in the 25% range for years.  He had PCI to OM1 in 2007 and RCA in 2013.  From his report, these episodes do not sound like ACS events.  Echo in 12/22 showed EF < 20%, severe LV dilation, restrictive diastolic function, moderate RV dysfunction,  moderate MR, mod-severe TR. HF was been complicated by CKD stage 3. He was also noted on device interrogation to have been in atrial fibrillation persistently since 10/22.  He was admitted at [facility] in 12/22.  BP was low, his BP-active meds were stopped and midodrine was begun.  He was diuresed and discharged home.    He reports ongoing severe dyspnea and was admitted again in 12/22 after he stood up then passed out at home.  Device interrogation showed run of VT treated x 2 with ATP possibly around the time of syncope.  RHC in 12/22 showed low output HF and he developed cardiogenic shock. Impella 5.5 was placed.  On 09/17/21, HM3 LVAD was placed, ICD leads were plastered to the tricuspid valve with severe TR, the valve was not replaced.  The patient had significant interoperative hypotension.  Post-operatively, he developed progressive renal failure and was started on CVVH then iHD.  He had a bumpy post-op course with renal failure and RV failure, but was ultimately able to discharge home on 10/24/21.   Ramp echo in 3/23, RV mildly dilated with normal systolic function, severe TR, LV EF 20-25%, aortic valve opens every beat.  Speed increased to 5300 rpm. The IV septum was mildly leftwards at 5300 rpm but flow increased and AoV still opened every beat.   Patient was admitted in 3/24 with left groin abscess.  This spontaneously drained and was treated with IV antibiotics.  He was sent home on linezolid and Augmentin.  He developed profuse diarrhea that he attributed to linezolid.  Linezolid was stopped and he was continued on Augmentin.   Patient returns for LVAD followup. Still has episodes of loose stool but fewer since stopping linezolid.  No blood in stool, no fever. He feels good overall, no more pain at groin abscess site.  No dyspnea with ADLs or walking on flat ground.  No lightheadedness.    Denies LVAD alarms.  Denies driveline trauma, erythema or drainage.  Denies ICD shocks.   Reports taking  Coumadin as prescribed and adherence to anticoagulation based dietary restrictions.  Denies bright red blood per rectum or melena, no dark urine or hematuria.    Labs (3/23): LFTs normal, TSH mildly elevated Labs (7/23): hgb 12.1 Labs (8/23): hgb 11.8 => 9.9, K 4.4, creatinine 2.5 => 3.09, LFTs normal.  Labs (11/23): K 4.5, creatinine 2.95 Labs (1/24): TSH 5.2 Labs (3/24): hgb 10.7, LDH 157, LFTs normal, creatinine 2.75  PMH: 1. HTN 2. Hyperlipidemia 3. CAD: H/o PCI to [place] 2007 and PCI to RCA in 2013.  Uncertain of circumstances but do not sound like ACS events.  4. CKD stage IV: Patient was on HD for several months post-LVAD but was able to come off.  5. Atrial fibrillation: Paroxysmal 6. Chronic systolic CHF: Long-standing cardiomyopathy with EF in the 25% range.  Cause uncertain, has history of PCI to RCA and OM1, but degree of CAD does not seem to explain severity of cardiomyopathy.  Father had history of CAD/CHF.  Not heavy drinker. Has [employer] ICD.  - Echo (12/22): EF < 20%, severe LV dilation, restrictive diastolic function, moderate RV dysfunction, moderate MR, mod-severe TR.  - 12/22 cardiogenic shock with Impella 5.5 placement then Heartmate 3 LVAD.  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  7. VT 8. RV failure/severe TR 9. Citrobacter UTI (8/23) 20. Left groin abscess 3/24   Current Outpatient Medications  Medication Sig Dispense Refill   albuterol (VENTOLIN HFA) 108 (90 Base) MCG/ACT inhaler Inhale 2 puffs into the lungs every 6 (six) hours as needed. (Patient taking differently: Inhale 2 puffs into the lungs every 6 (six) hours as needed for wheezing or shortness of breath.) 1 each 3   amiodarone (PACERONE) 200 MG tablet Take 1 tablet (200 mg total) by mouth daily. Take 200 mg twice a day x 7 days, then resume a dose of 200 mg daily. (Patient taking differently: Take 200 mg by mouth daily.) 30 tablet 6   amoxicillin-clavulanate  (AUGMENTIN) 500-125 MG tablet Take 1 tablet by mouth every 12 (twelve) hours. 19 tablet 0   furosemide (LASIX) 40 MG tablet Take 1.5 tablets (60 mg total) by mouth daily. 70 tablet 3   pantoprazole (PROTONIX) 40 MG tablet TAKE 1 TABLET BY MOUTH EVERY DAY (Patient taking differently: Take 40 mg by mouth daily before breakfast.) 90 tablet 3   rosuvastatin (CRESTOR) 10 MG tablet TAKE 1 TABLET BY MOUTH EVERY DAY (Patient taking differently: Take 10 mg by mouth at bedtime.) 90 tablet 3   sildenafil (REVATIO) 20 MG tablet Take 1 tablet (20 mg total) by mouth 3 (three) times daily. 90 tablet 11   warfarin (COUMADIN) 2 MG tablet Take 1 tablet (2 mg total) by mouth daily at 4 PM. 30 tablet 5   No current facility-administered medications for this encounter.    Mushroom extract complex, Doxycycline, Neosporin [neomycin-bacitracin zn-polymyx], and Tape  REVIEW OF SYSTEMS: All systems negative except as listed in HPI, PMH and Problem list.   LVAD INTERROGATION:  See LVAD nurse's note above.   I reviewed the LVAD parameters from today, and compared the results to the patient's prior recorded data.  No programming changes were made.  The LVAD is functioning within specified parameters.  The patient performs LVAD self-test daily.  LVAD interrogation was negative for any significant power changes, alarms or PI events/speed drops.  LVAD equipment check completed and is in good working order.  Back-up equipment present.   LVAD education done on emergency procedures and precautions and reviewed exit site care.    Vitals:   12/09/22 1121 12/09/22 1122  BP: 91/74 (!) 84/0  Pulse: 78   SpO2: 99%   Weight: 86.3 kg (190 lb 3.2 oz)   Height: 5\' 9"  (1.753 m)   MAP 74  Physical Exam: General: Well appearing this am. NAD.  HEENT: Normal. Neck: Supple, JVP 8-9 cm. Carotids OK.  Cardiac:  Mechanical heart sounds with LVAD hum present.  Lungs:  CTAB, normal effort.  Abdomen:  NT, ND, no HSM. No bruits or  masses. +BS  LVAD exit site: Well-healed and incorporated. Dressing dry and intact. No erythema or drainage. Stabilization device present and accurately applied. Driveline dressing changed daily per sterile technique. Extremities:  Warm and dry. No cyanosis, clubbing, rash, or edema.  Neuro:  Alert & oriented x 3. Cranial nerves grossly intact. Moves all 4 extremities w/o difficulty. Affect pleasant    ASSESSMENT  AND PLAN: 1. Chronic systolic CHF:  Long-standing cardiomyopathy.  [place].  Echo in 12/22 with EF < 20%, severe LV dilation, restrictive diastolic function, moderate RV dysfunction, moderate MR, mod-severe TR. Cause of cardiomyopathy is uncertain.  He has a history of CAD, but I do not think that the described CAD from the past could explain his cardiomyopathy, but CAD could have progressed.  He was admitted in 12/22 with cardiogenic shock and Impella 5.5. s/p HM III VAD on 09/17/22.  Post-op course complicated by AKI and RV failure. He was on HD for several months but is now off.  NYHA class II with mild volume overload on exam. He has severe TR.   - Continue Lasix 60 mg daily. BMET today.  - Continue sildenafil 20 tid for RV.   - Warfarin for INR 2-2.5.   - His [employer] device is at [employer].  He has never been shocked.  We discussed generator replacement vs no replacement.  He is inclined to avoid further procedures, so I told him that I thought it would be reasonable not to replace generator. He will discuss with his wife.  2. Tricuspid regurgitation:  Tricuspid repair not done at time of VAD due to proximity of ICD wires and hypotension during surgery.  He has severe TR.  3. Atrial fibrillation: Regular rhythm. - Continue amiodarone 200 mg daily, check LFTs and TSH.  He will need a regular eye exam. 4. CKD stage IV: Suspect intra-op hypotension at the time of LVAD placement led to development of ATN => urine sediment looked like ATN per renal. Had initial CVVH and  transitioned over to HD. He is now off HD with creatinine stable, most recently 2.75 on today's BMET.  5. CAD: History of PCI to OM1 in 2007 and RCA in 2013.  No CP or ACs.  - Deferred cardiac cath in 12/22 due to AKI and plan for VAD - Continue Crestor.   6. Abnormal thyroid function tests: Patient has had elevated TSH and free T4, mixed pattern. He is on amiodarone.  - I would like him to see endocrinology, will refer.  7. Groin abscess: This seems resolved.  He went home from the hospital on linezolid and Augmentin. We stopped linezolid due to diarrhea.  He still has some loose stool but much improved.  WBCs normal today so C difficile is unlikely.  - If diarrhea worsens/does not resolve, will need to send stool for C difficile.  - Will stop Augmentin after 1 week.   Followup in 2 months.   Loralie Champagne 12/09/2022

## 2022-12-09 NOTE — Patient Instructions (Signed)
Take Augmentin for 2 more days and then stop, call VAD office if diarrhea persists after stopping Augmentin Coumadin dosing per Lauren PharmD Return to [facility] in 2 months.

## 2022-12-09 NOTE — Progress Notes (Signed)
LVAD INR 

## 2022-12-10 ENCOUNTER — Other Ambulatory Visit (HOSPITAL_COMMUNITY): Payer: Self-pay | Admitting: Unknown Physician Specialty

## 2022-12-10 LAB — T3, FREE: T3, Free: 2.6 pg/mL (ref 2.0–4.4)

## 2022-12-10 NOTE — Addendum Note (Signed)
Encounter addended by: Christinia Gully, RN on: 12/10/2022 10:43 AM  Actions taken: Order list changed, Diagnosis association updated

## 2022-12-10 NOTE — Progress Notes (Signed)
Placed referral in epic to [facility] per DR Aundra Dubin. Waited on hold for 20 minutes to be transferred to a VM. Office staff would not give out direct number for referrals. Left a detailed msg asking for assistance to get Mr Seidman scheduled for elevated thyroid panel. Pt was referred back in January and referral was closed in epic. Pt states that he did not hear from them via phone and he states he doesn't check his mychart often.   [name] RN, BSN VAD Coordinator 24/7 Pager [phone]

## 2022-12-16 ENCOUNTER — Other Ambulatory Visit (HOSPITAL_COMMUNITY): Payer: Self-pay

## 2022-12-18 ENCOUNTER — Ambulatory Visit (HOSPITAL_COMMUNITY): Payer: Self-pay | Admitting: Pharmacist

## 2022-12-18 LAB — POCT INR: INR: 1.8 — AB (ref 2.0–3.0)

## 2022-12-25 ENCOUNTER — Ambulatory Visit (HOSPITAL_COMMUNITY): Payer: Self-pay

## 2022-12-25 LAB — POCT INR: INR: 2.9 (ref 2.0–3.0)

## 2022-12-25 IMAGING — DX DG CHEST 1V PORT
1 series · 1 of 1 positions shown · non-contrast
Comparison: 09/09/2021

CLINICAL DATA: Status post Impella placement

EXAM:
PORTABLE CHEST 1 VIEW

[chest]
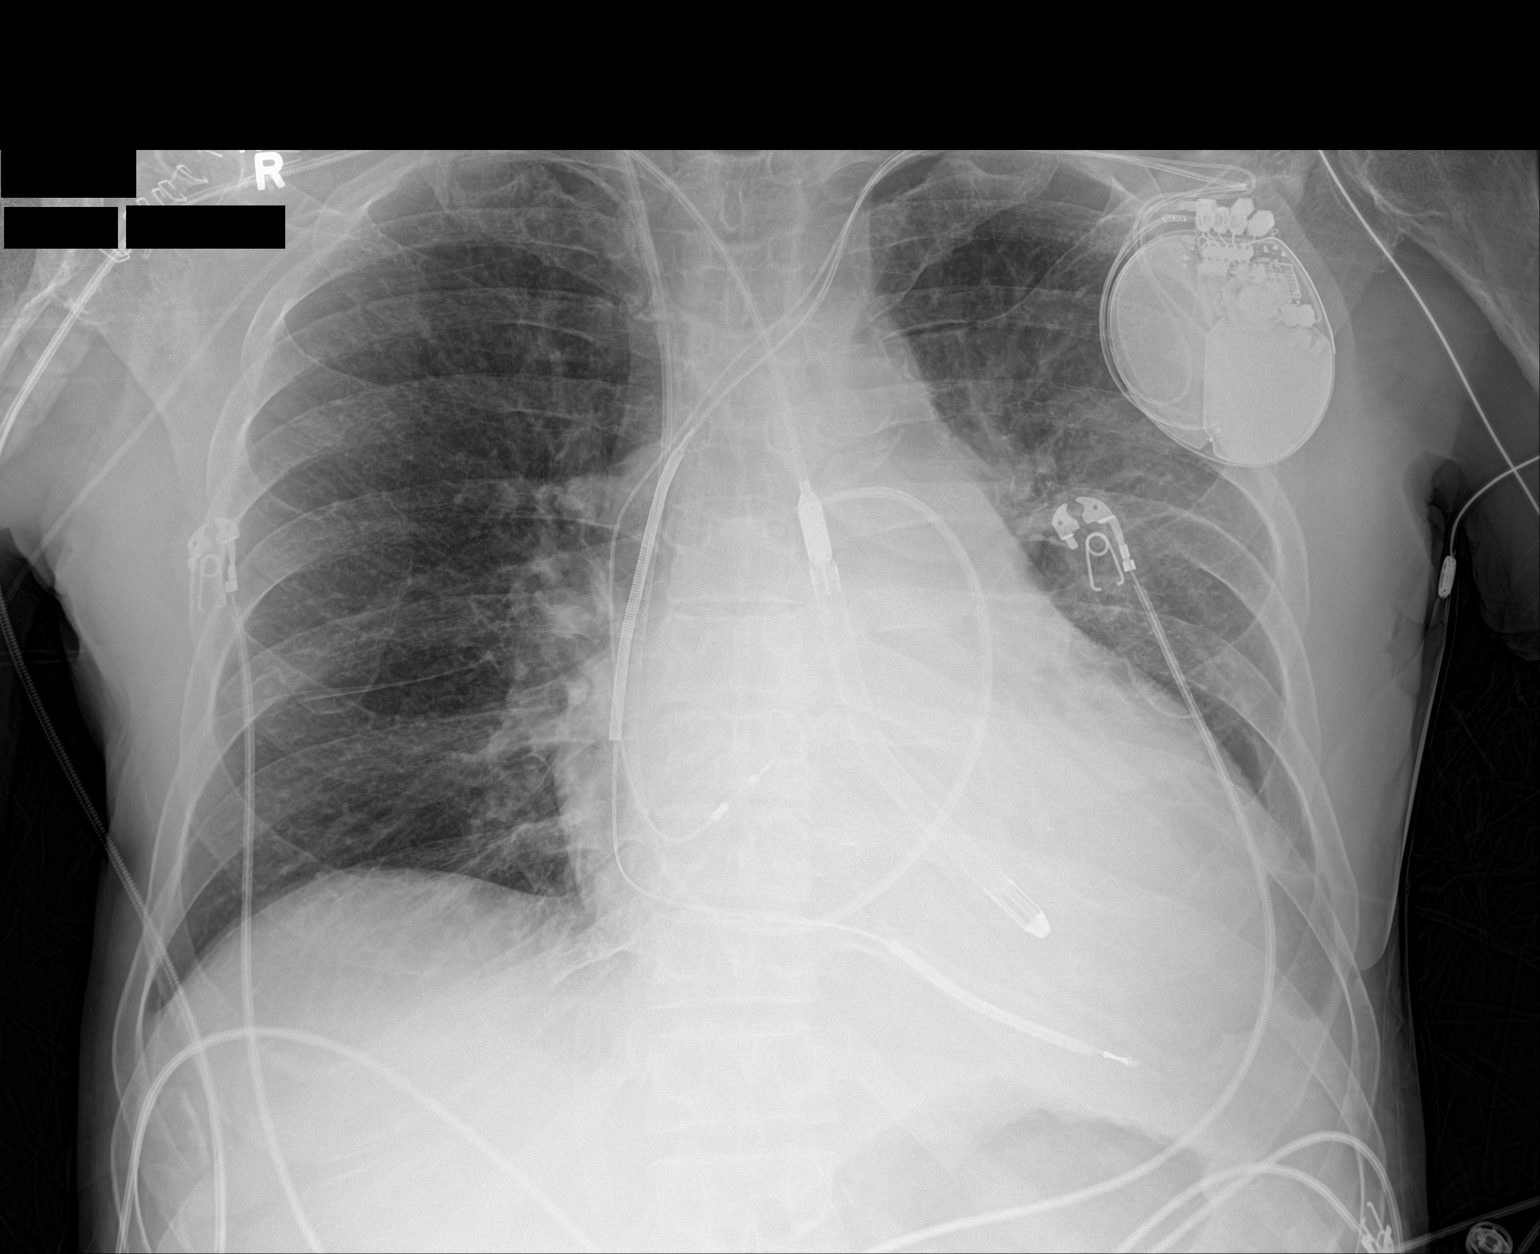

[1 of 1 positions shown; findings below may reference images not displayed]

FINDINGS: Lungs are clear. Trace left pleural effusion. Mild left perihilar
edema, equivocal. No pneumothorax.

Cardiomegaly. Left subclavian ICD. Right IJ Swan-Ganz catheter tip
in the right main pulmonary artery. Right subclavian Impella LVAD
overlying the inferior aspect of the left ventricle.

Skin staples along the right axilla.
IMPRESSION: Right subclavian Impella LVAD in satisfactory position. Additional
support apparatus as above.

Trace left pleural effusion.  Mild left perihilar edema, equivocal.

## 2022-12-30 IMAGING — DX DG CHEST 1V PORT
1 series · 1 of 1 positions shown · non-contrast
Comparison: Radiograph 09/12/2021

CLINICAL DATA: Implantable left ventricular assist device

EXAM:
PORTABLE CHEST 1 VIEW

[chest ap]
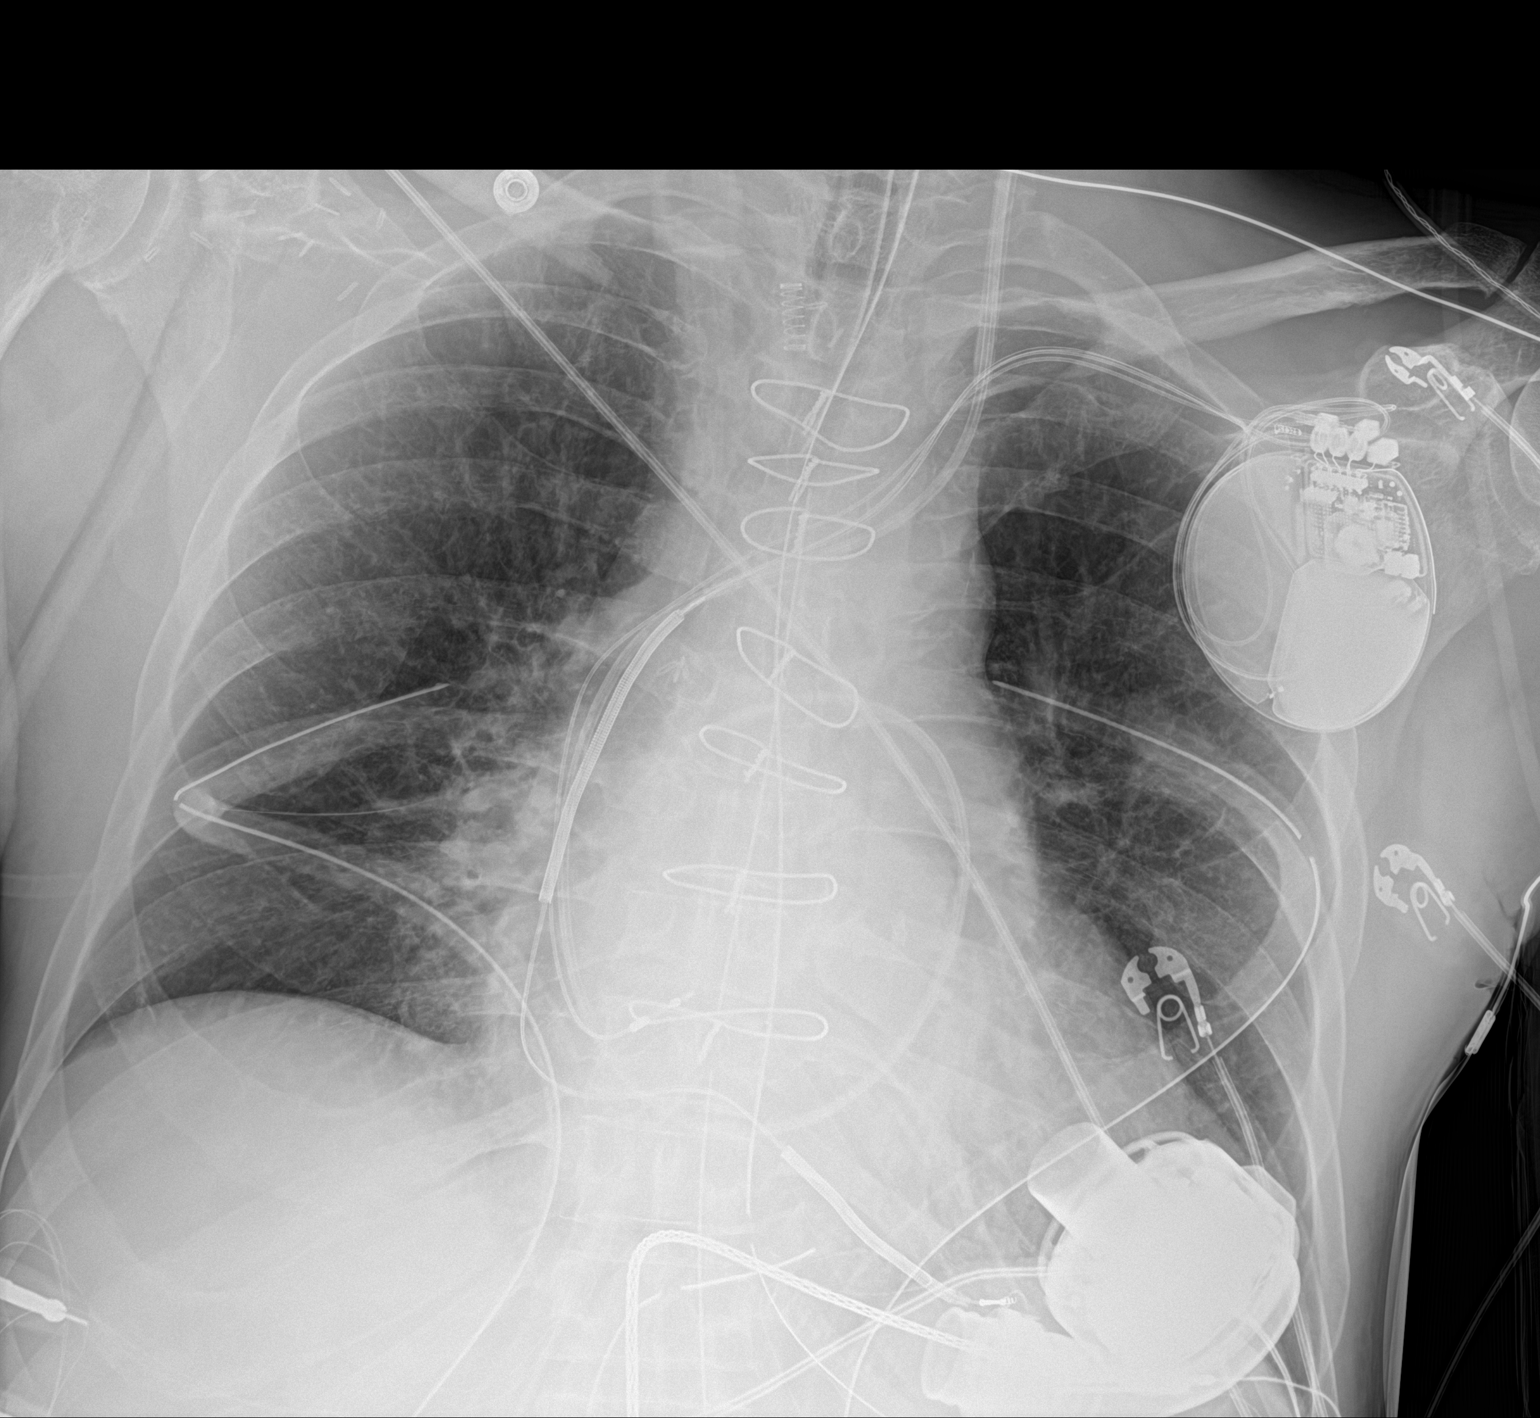

[1 of 1 positions shown; findings below may reference images not displayed]

FINDINGS: Interval median sternotomy with LVAD implantation. Endotracheal tube
overlies the upper thoracic trachea. Pulmonary artery catheter tip
overlies the right pulmonary artery. Unchanged pacemaker/AICD leads.
Bilateral chest tubes are in place. There is a mediastinal drain in
place. Orogastric tube side port overlies the gastroesophageal
junction. Impella device has been removed.

Mildly enlarged cardiac silhouette. Mild diffuse interstitial
opacities. No large pleural effusion. No visible pneumothorax. Right
axillary surgical clips.
IMPRESSION: Postoperative chest after LVAD placement. Lines and tubes as
described above, of note the orogastric tube side port overlies the
gastroesophageal junction, recommend advancement.

Mild pulmonary edema.

## 2022-12-31 IMAGING — DX DG CHEST 1V PORT
1 series · 1 of 1 positions shown · non-contrast
Comparison: Chest radiograph 1 day prior

CLINICAL DATA: Intubated, chest tube present, status post LVAD

EXAM:
PORTABLE CHEST 1 VIEW

[chest ap]
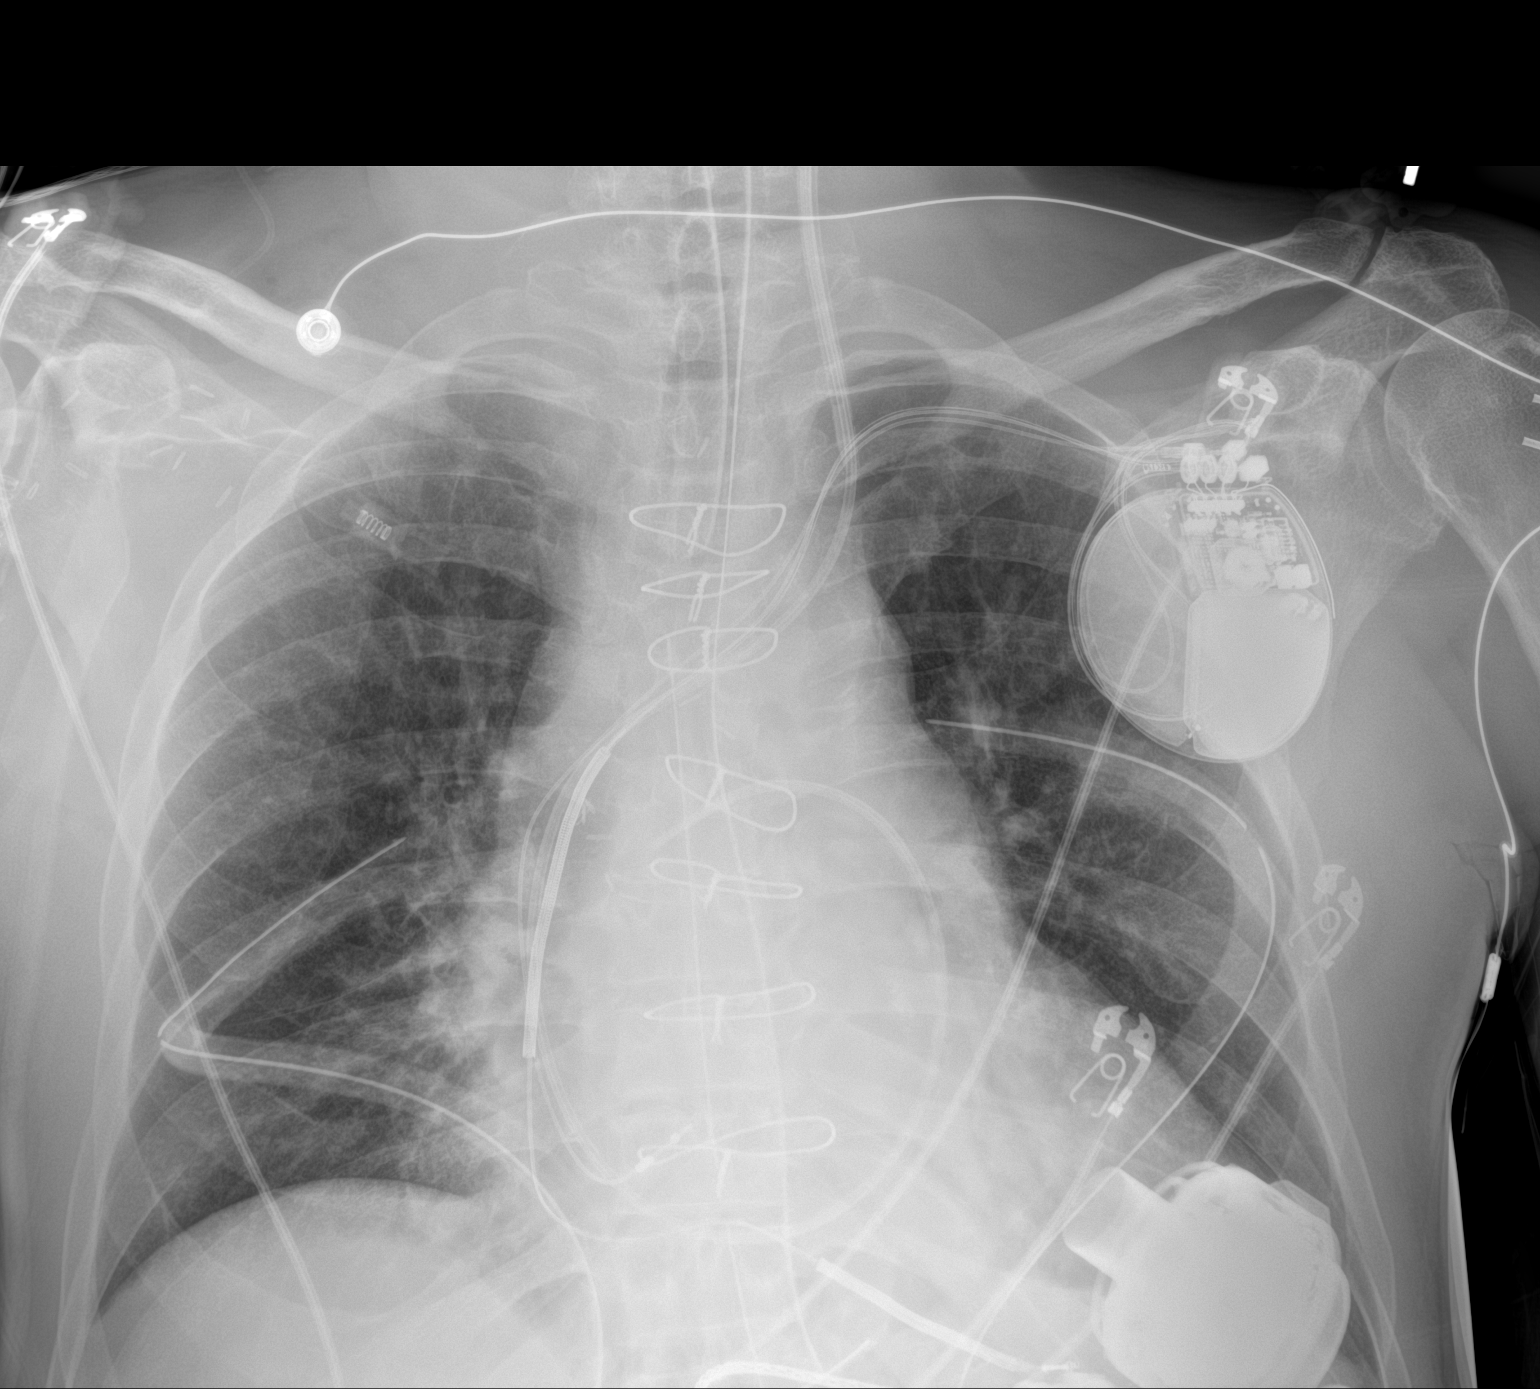

[1 of 1 positions shown; findings below may reference images not displayed]

FINDINGS: Endotracheal tube tip is approximately 4.9 cm from the carina. The
left chest wall cardiac device and associated leads are stable.
There is a left IJ Swan-Ganz catheter terminating in the right
pulmonary artery. The left ventricular cyst device is stable.
Bilateral chest tubes are stable. Presumed mediastinal drain is
noted. The enteric catheter courses off the field of view. The side
hole is no longer identified.

The heart is enlarged, unchanged. The mediastinal contours are
stable.

Aeration of the lungs is not significantly changed. There is no new
or worsening focal airspace disease. There is no significant pleural
effusion. There is no pneumothorax.

The bones are stable.
IMPRESSION: 1. Lines and tubes as above.
2. No significant change in lung aeration. No new or worsening
airspace disease.

## 2023-01-01 ENCOUNTER — Ambulatory Visit (HOSPITAL_COMMUNITY): Payer: Self-pay

## 2023-01-01 LAB — POCT INR: INR: 2 (ref 2.0–3.0)

## 2023-01-01 IMAGING — DX DG CHEST 1V PORT
1 series · 1 of 1 positions shown · non-contrast
Comparison: Chest radiograph 1 day prior

CLINICAL DATA: Chest tube present, status post LVAD

EXAM:
PORTABLE CHEST 1 VIEW

[chest ap]
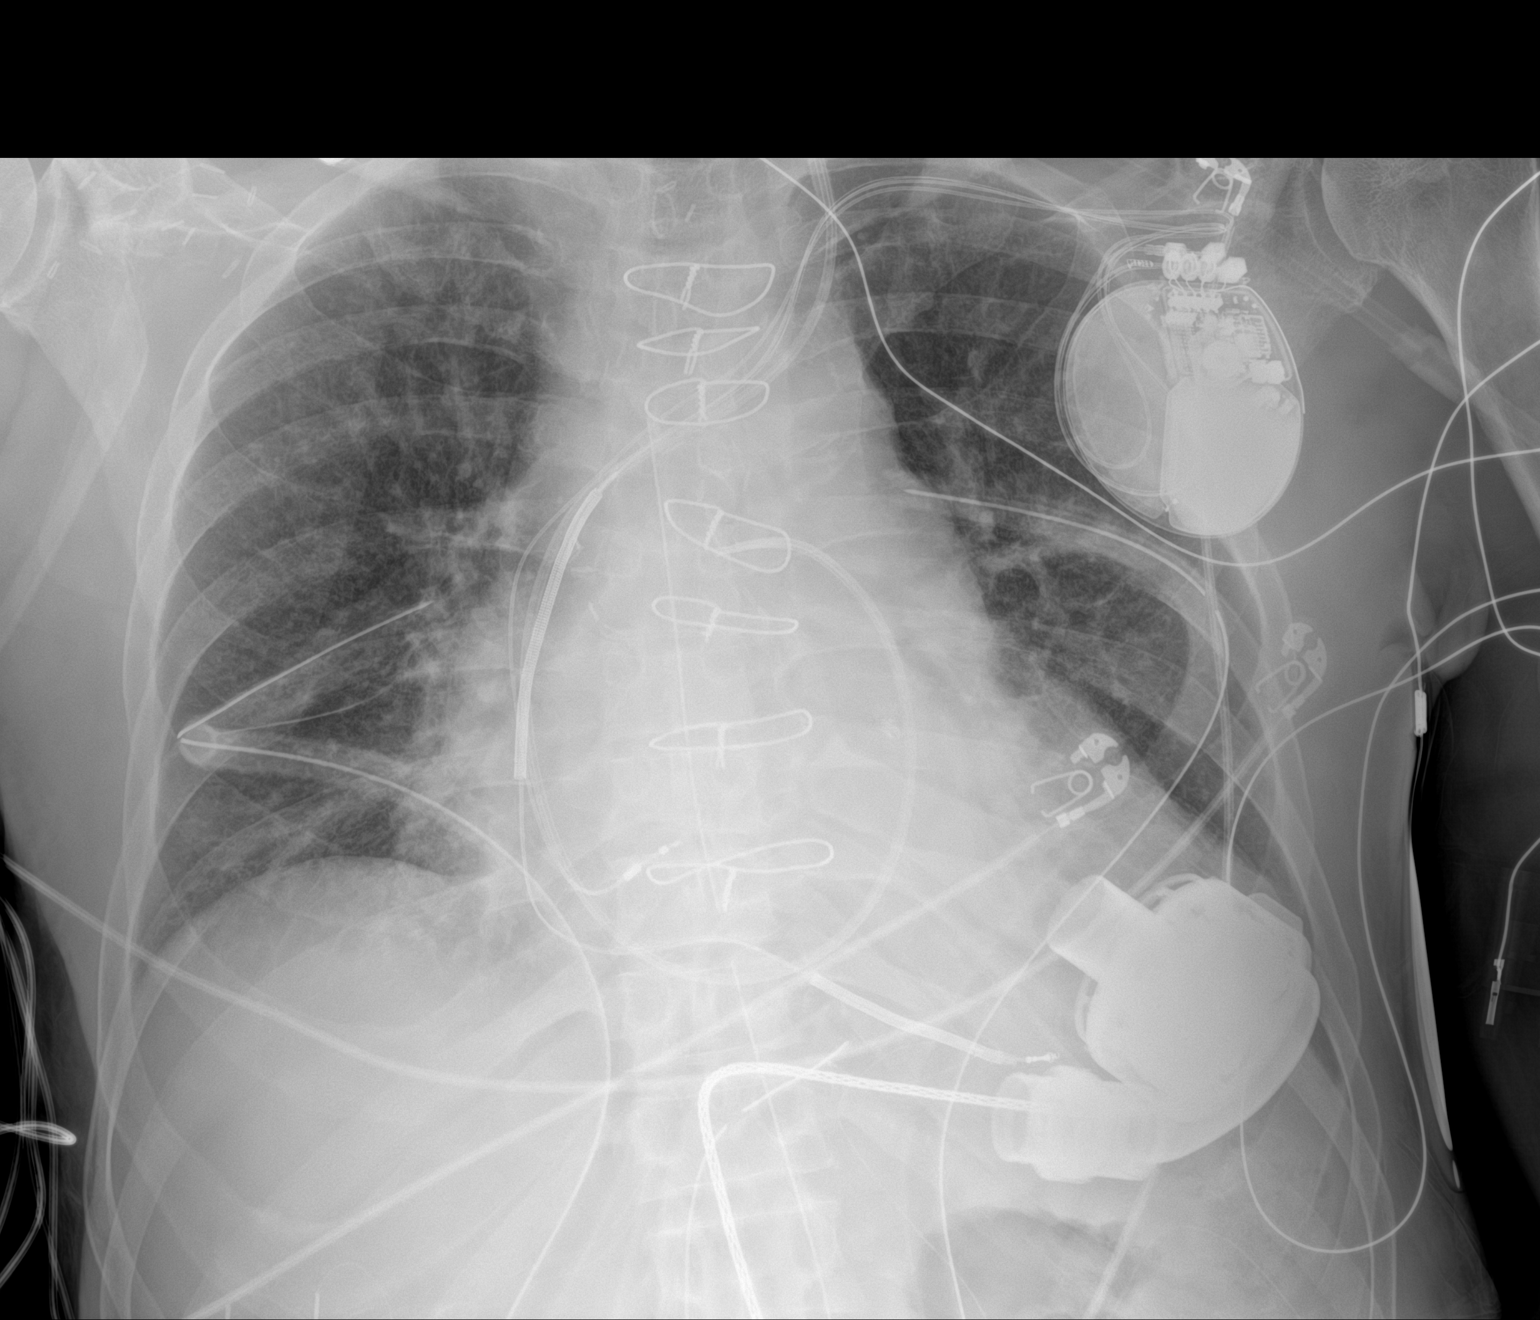

[1 of 1 positions shown; findings below may reference images not displayed]

FINDINGS: The patient has been extubated. The enteric catheter has been
removed. The left IJ Swan-Ganz catheter is in stable position
terminating in the right pulmonary artery. The left chest wall
cardiac device and associated leads are stable. The LVAD is stable.
Mediastinal drains and bilateral chest tubes are stable.

Aeration of the lungs is unchanged. There is no new or worsening
focal airspace disease. The left costophrenic angle is not well
evaluated due to the LVAD. There is no significant right effusion.
There is no pneumothorax.

The bones are stable.
IMPRESSION: 1. Interval extubation.  Remaining support devices as above.
2. No significant interval change in lung aeration since the study
from 1 day prior.

## 2023-01-02 IMAGING — DX DG CHEST 1V PORT
1 series · 1 of 1 positions shown · non-contrast
Comparison: Chest x-ray 09/19/2021.

CLINICAL DATA: 70-year-old male with history of shortness of
breath.

EXAM:
PORTABLE CHEST 1 VIEW

[chest]
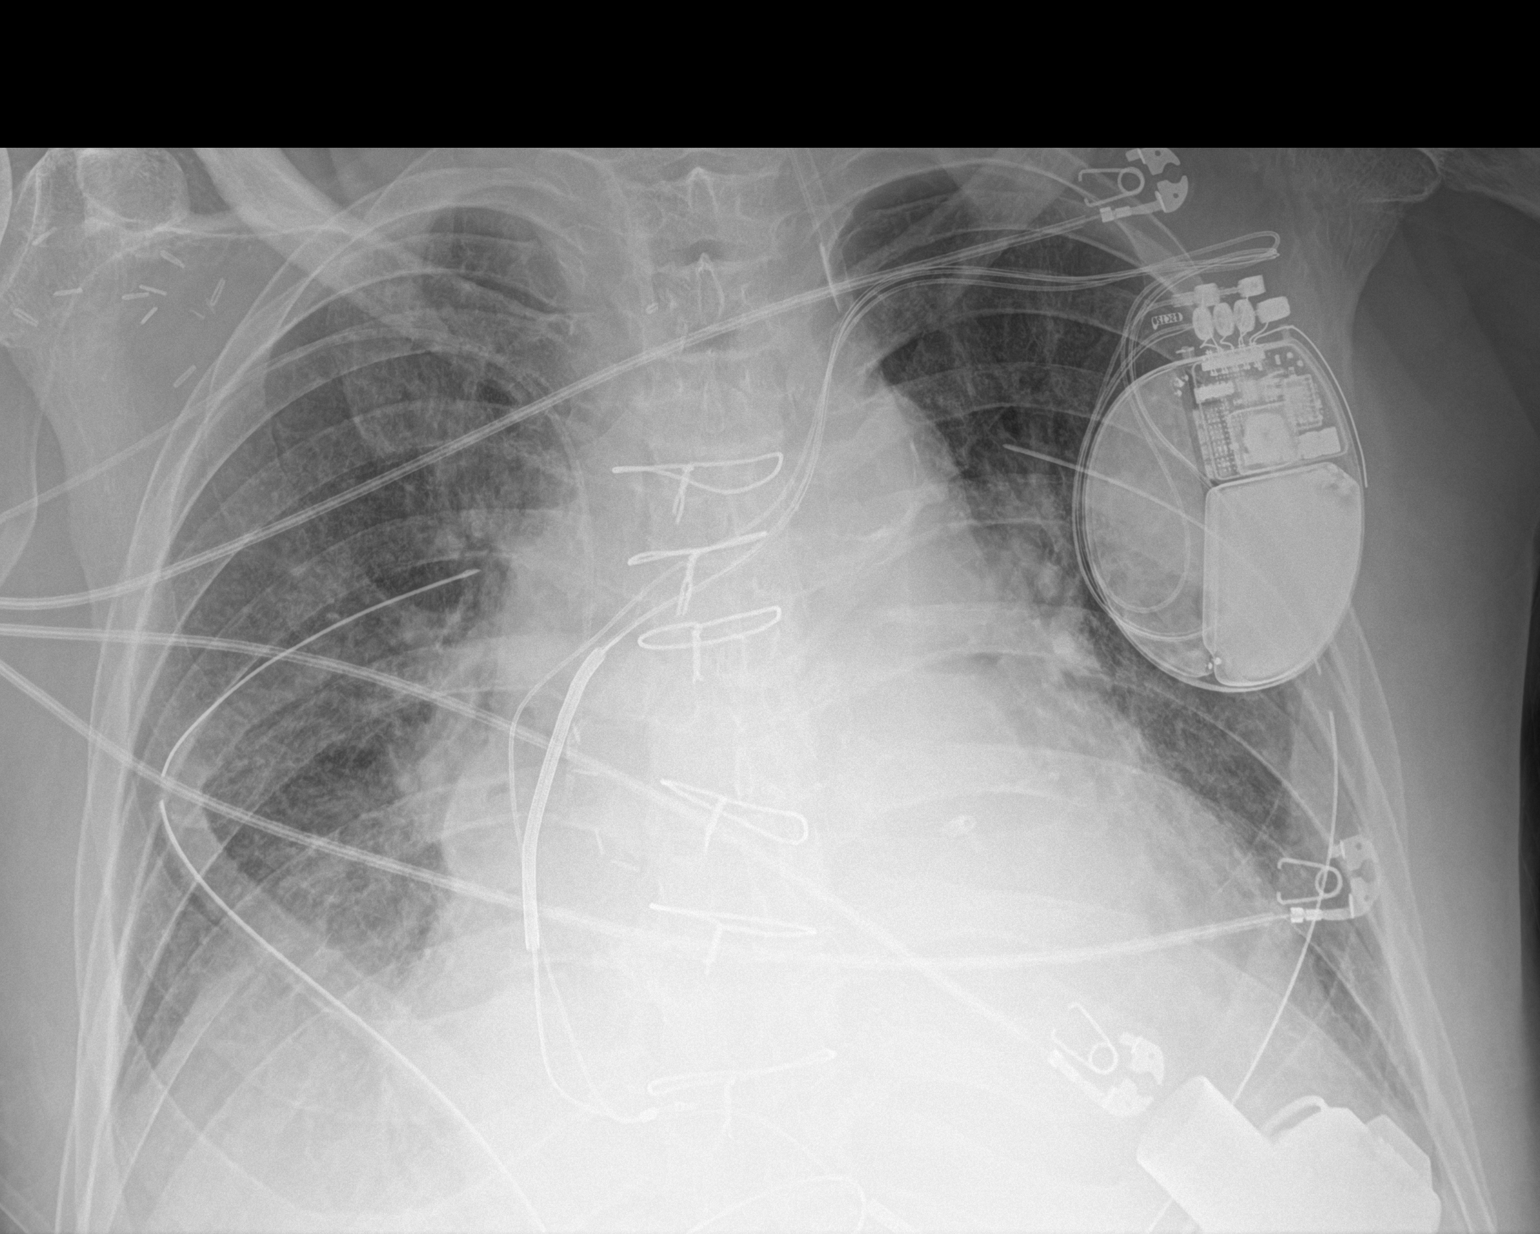

[1 of 1 positions shown; findings below may reference images not displayed]

FINDINGS: Left internal jugular Cordis with tip terminating in the distal left
internal jugular vein. Left-sided chest tube with tip projecting
over the upper left hemithorax. Right-sided chest tube with tip
projecting over the mid right hemithorax. There is a right upper
extremity PICC with tip terminating in the superior cavoatrial
junction. Left ventricular assist device partially imaged projecting
over the cardiac apex. Left-sided pacemaker/AICD with lead tips
projecting over the expected location of the right atrium and right
ventricular apex. Status post median sternotomy. Bibasilar opacities
which may reflect areas of atelectasis and/or consolidation. No
definite pleural effusions. No definite pneumothorax. There is
cephalization of the pulmonary vasculature and slight indistinctness
of the interstitial markings suggestive of mild pulmonary edema.
Mild cardiomegaly. Upper mediastinal contours are within normal
limits. Atherosclerotic calcifications are noted in the thoracic
aorta.
IMPRESSION: 1. Postoperative changes and support apparatus, as above.
2. The appearance the chest suggests probable congestive heart
failure, as above.
3. Aortic atherosclerosis.

## 2023-01-04 ENCOUNTER — Telehealth (HOSPITAL_COMMUNITY): Payer: Self-pay | Admitting: Unknown Physician Specialty

## 2023-01-04 NOTE — Telephone Encounter (Signed)
Spoke with pt and his wife to inform them of Endo appt with [facility]. Pt notified he is scheduled for July 15th at 1:30 pm. Pt was given the address and phone number to this clinic.  Pt ask if he could take a probiotic. Pt informed this is fine for him to take. Wife ask about stopping protonix. Pt was educated on the importance of taking Protonix. Wife assures me they will continue the medications.  Carlton Adam RN, BSN VAD Coordinator 24/7 Pager [phone]

## 2023-01-07 ENCOUNTER — Ambulatory Visit (INDEPENDENT_AMBULATORY_CARE_PROVIDER_SITE_OTHER): Payer: Medicare HMO

## 2023-01-07 DIAGNOSIS — I428 Other cardiomyopathies: Secondary | ICD-10-CM

## 2023-01-12 LAB — CUP PACEART REMOTE DEVICE CHECK
Battery Remaining Longevity: 11 mo
Battery Remaining Percentage: 12 %
Brady Statistic RA Percent Paced: 0 %
Brady Statistic RV Percent Paced: 6 %
Date Time Interrogation Session: 20240420195900
HighPow Impedance: 53 Ohm
Implantable Lead Connection Status: 753985
Implantable Lead Connection Status: 753985
Implantable Lead Implant Date: 20140205
Implantable Lead Implant Date: 20140205
Implantable Lead Location: 753859
Implantable Lead Location: 753860
Implantable Lead Model: 296
Implantable Lead Model: 4470
Implantable Lead Serial Number: 727876
Implantable Pulse Generator Implant Date: 20140205
Lead Channel Impedance Value: 362 Ohm
Lead Channel Impedance Value: 413 Ohm
Lead Channel Pacing Threshold Amplitude: 1.5 V
Lead Channel Pacing Threshold Amplitude: 3.4 V
Lead Channel Pacing Threshold Pulse Width: 0.5 ms
Lead Channel Pacing Threshold Pulse Width: 2 ms
Lead Channel Setting Pacing Amplitude: 3.5 V
Lead Channel Setting Pacing Pulse Width: 0.5 ms
Lead Channel Setting Sensing Sensitivity: 0.6 mV
Pulse Gen Serial Number: 113490
Zone Setting Status: 755011

## 2023-01-15 ENCOUNTER — Ambulatory Visit (HOSPITAL_COMMUNITY): Payer: Self-pay | Admitting: Pharmacist

## 2023-01-15 LAB — POCT INR: INR: 2.4 (ref 2.0–3.0)

## 2023-01-20 NOTE — Progress Notes (Signed)
Remote ICD transmission.   

## 2023-01-25 ENCOUNTER — Ambulatory Visit (HOSPITAL_COMMUNITY): Payer: Self-pay | Admitting: Pharmacist

## 2023-01-25 LAB — POCT INR
INR: 1.4 — AB (ref 2.0–3.0)
INR: 1.9 — AB (ref 2.0–3.0)

## 2023-01-28 ENCOUNTER — Ambulatory Visit (HOSPITAL_COMMUNITY): Payer: Self-pay | Admitting: Pharmacist

## 2023-01-28 LAB — POCT INR: INR: 2 (ref 2.0–3.0)

## 2023-01-31 ENCOUNTER — Encounter (HOSPITAL_COMMUNITY): Payer: Self-pay

## 2023-01-31 NOTE — Progress Notes (Unsigned)
Pt's wife paged VAD paged stating pt has had ongoing nausea throughout the week. Pt has a prescription for Zofran but has not taken any. Pt's wife states he had and episode of chest pain on the left side that subsided through drinking water while they were driving to [facility] this morning. VAD parameters stable no alarms noted. Parameters listed below. At this time all symptoms have resolved. Pt advised to notify VAD team if symptoms continue and we can see him in clinic this week.   VAD Parameters: Speed:5300 Flow:4.4 PI: 3.9 Power: 3.9w Alarms: Low voltage advisory alarm yesterday; no other alarms noted

## 2023-02-05 LAB — POCT INR: INR: 2 (ref 2.0–3.0)

## 2023-02-08 ENCOUNTER — Ambulatory Visit (INDEPENDENT_AMBULATORY_CARE_PROVIDER_SITE_OTHER): Payer: Medicare HMO

## 2023-02-08 ENCOUNTER — Ambulatory Visit (HOSPITAL_COMMUNITY): Payer: Self-pay | Admitting: Pharmacist

## 2023-02-08 DIAGNOSIS — I5022 Chronic systolic (congestive) heart failure: Secondary | ICD-10-CM

## 2023-02-08 DIAGNOSIS — I428 Other cardiomyopathies: Secondary | ICD-10-CM

## 2023-02-08 LAB — CUP PACEART REMOTE DEVICE CHECK
Battery Remaining Longevity: 5 mo
Battery Remaining Percentage: 6 %
Brady Statistic RA Percent Paced: 0 %
Brady Statistic RV Percent Paced: 5 %
Date Time Interrogation Session: 20240520031200
HighPow Impedance: 50 Ohm
Implantable Lead Connection Status: 753985
Implantable Lead Connection Status: 753985
Implantable Lead Implant Date: 20140205
Implantable Lead Implant Date: 20140205
Implantable Lead Location: 753859
Implantable Lead Location: 753860
Implantable Lead Model: 296
Implantable Lead Model: 4470
Implantable Lead Serial Number: 727876
Implantable Pulse Generator Implant Date: 20140205
Lead Channel Impedance Value: 353 Ohm
Lead Channel Impedance Value: 415 Ohm
Lead Channel Pacing Threshold Amplitude: 1.5 V
Lead Channel Pacing Threshold Amplitude: 3.4 V
Lead Channel Pacing Threshold Pulse Width: 0.5 ms
Lead Channel Pacing Threshold Pulse Width: 2 ms
Lead Channel Setting Pacing Amplitude: 3.5 V
Lead Channel Setting Pacing Pulse Width: 0.5 ms
Lead Channel Setting Sensing Sensitivity: 0.6 mV
Pulse Gen Serial Number: 113490
Zone Setting Status: 755011

## 2023-02-08 NOTE — Progress Notes (Signed)
Remote ICD transmission.   

## 2023-02-08 NOTE — Addendum Note (Signed)
Addended by: Elease Etienne A on: 02/08/2023 11:38 AM   Modules accepted: Level of Service

## 2023-02-09 ENCOUNTER — Encounter: Payer: Self-pay | Admitting: "Endocrinology

## 2023-02-10 ENCOUNTER — Other Ambulatory Visit (HOSPITAL_COMMUNITY): Payer: Self-pay | Admitting: Unknown Physician Specialty

## 2023-02-10 DIAGNOSIS — Z7901 Long term (current) use of anticoagulants: Secondary | ICD-10-CM

## 2023-02-10 DIAGNOSIS — Z95811 Presence of heart assist device: Secondary | ICD-10-CM

## 2023-02-11 ENCOUNTER — Encounter (HOSPITAL_COMMUNITY): Payer: Self-pay | Admitting: Cardiology

## 2023-02-11 ENCOUNTER — Ambulatory Visit (HOSPITAL_COMMUNITY): Payer: Self-pay | Admitting: Pharmacist

## 2023-02-11 ENCOUNTER — Other Ambulatory Visit (HOSPITAL_COMMUNITY): Payer: Self-pay

## 2023-02-11 ENCOUNTER — Ambulatory Visit (HOSPITAL_COMMUNITY)
Admission: RE | Admit: 2023-02-11 | Discharge: 2023-02-11 | Disposition: A | Discharge status: Home / Self Care | Payer: Medicare HMO | Source: Ambulatory Visit | Attending: Cardiology | Admitting: Cardiology

## 2023-02-11 DIAGNOSIS — Z95811 Presence of heart assist device: Secondary | ICD-10-CM

## 2023-02-11 DIAGNOSIS — I5022 Chronic systolic (congestive) heart failure: Secondary | ICD-10-CM | POA: Insufficient documentation

## 2023-02-11 DIAGNOSIS — I4891 Unspecified atrial fibrillation: Secondary | ICD-10-CM

## 2023-02-11 DIAGNOSIS — Z955 Presence of coronary angioplasty implant and graft: Secondary | ICD-10-CM | POA: Diagnosis not present

## 2023-02-11 DIAGNOSIS — N186 End stage renal disease: Secondary | ICD-10-CM | POA: Diagnosis not present

## 2023-02-11 DIAGNOSIS — R946 Abnormal results of thyroid function studies: Secondary | ICD-10-CM | POA: Insufficient documentation

## 2023-02-11 DIAGNOSIS — I132 Hypertensive heart and chronic kidney disease with heart failure and with stage 5 chronic kidney disease, or end stage renal disease: Secondary | ICD-10-CM | POA: Insufficient documentation

## 2023-02-11 DIAGNOSIS — I429 Cardiomyopathy, unspecified: Secondary | ICD-10-CM | POA: Insufficient documentation

## 2023-02-11 DIAGNOSIS — K4091 Unilateral inguinal hernia, without obstruction or gangrene, recurrent: Secondary | ICD-10-CM

## 2023-02-11 DIAGNOSIS — Z7901 Long term (current) use of anticoagulants: Secondary | ICD-10-CM | POA: Insufficient documentation

## 2023-02-11 DIAGNOSIS — N179 Acute kidney failure, unspecified: Secondary | ICD-10-CM | POA: Diagnosis not present

## 2023-02-11 DIAGNOSIS — I2510 Atherosclerotic heart disease of native coronary artery without angina pectoris: Secondary | ICD-10-CM | POA: Diagnosis not present

## 2023-02-11 DIAGNOSIS — L02214 Cutaneous abscess of groin: Secondary | ICD-10-CM | POA: Diagnosis not present

## 2023-02-11 DIAGNOSIS — Z79899 Other long term (current) drug therapy: Secondary | ICD-10-CM | POA: Insufficient documentation

## 2023-02-11 LAB — CBC
HCT: 35.7 % — ABNORMAL LOW (ref 39.0–52.0)
Hemoglobin: 11.5 g/dL — ABNORMAL LOW (ref 13.0–17.0)
MCH: 30 pg (ref 26.0–34.0)
MCHC: 32.2 g/dL (ref 30.0–36.0)
MCV: 93.2 fL (ref 80.0–100.0)
Platelets: 186 10*3/uL (ref 150–400)
RBC: 3.83 MIL/uL — ABNORMAL LOW (ref 4.22–5.81)
RDW: 16.2 % — ABNORMAL HIGH (ref 11.5–15.5)
WBC: 4.3 10*3/uL (ref 4.0–10.5)
nRBC: 0 % (ref 0.0–0.2)

## 2023-02-11 LAB — BASIC METABOLIC PANEL
Anion gap: 12 (ref 5–15)
BUN: 29 mg/dL — ABNORMAL HIGH (ref 8–23)
CO2: 21 mmol/L — ABNORMAL LOW (ref 22–32)
Calcium: 8.8 mg/dL — ABNORMAL LOW (ref 8.9–10.3)
Chloride: 107 mmol/L (ref 98–111)
Creatinine, Ser: 2.7 mg/dL — ABNORMAL HIGH (ref 0.61–1.24)
GFR, Estimated: 25 mL/min — ABNORMAL LOW (ref >60)
Glucose, Bld: 81 mg/dL (ref 70–99)
Potassium: 3.6 mmol/L (ref 3.5–5.1)
Sodium: 140 mmol/L (ref 135–145)

## 2023-02-11 LAB — HEPATIC FUNCTION PANEL
ALT: 34 U/L (ref 0–44)
AST: 34 U/L (ref 15–41)
Albumin: 3.6 g/dL (ref 3.5–5.0)
Alkaline Phosphatase: 116 U/L (ref 38–126)
Bilirubin, Direct: 0.4 mg/dL — ABNORMAL HIGH (ref 0.0–0.2)
Indirect Bilirubin: 0.8 mg/dL (ref 0.3–0.9)
Total Bilirubin: 1.2 mg/dL (ref 0.3–1.2)
Total Protein: 7.1 g/dL (ref 6.5–8.1)

## 2023-02-11 LAB — T4, FREE: Free T4: 1.77 ng/dL — ABNORMAL HIGH (ref 0.61–1.12)

## 2023-02-11 LAB — PROTIME-INR
INR: 2.3 — ABNORMAL HIGH (ref 0.8–1.2)
Prothrombin Time: 25.3 seconds — ABNORMAL HIGH (ref 11.4–15.2)

## 2023-02-11 LAB — TSH: TSH: 4.095 u[IU]/mL (ref 0.350–4.500)

## 2023-02-11 LAB — LACTATE DEHYDROGENASE: LDH: 198 U/L — ABNORMAL HIGH (ref 98–192)

## 2023-02-11 MED ORDER — FUROSEMIDE 40 MG PO TABS: 80.0000 mg | ORAL_TABLET | Freq: Every day | ORAL | 3 refills | Status: DC

## 2023-02-11 MED ORDER — AMIODARONE HCL 200 MG PO TABS: 100.0000 mg | ORAL_TABLET | Freq: Every day | ORAL | 6 refills | Status: DC

## 2023-02-11 NOTE — Patient Instructions (Signed)
Increase Lasix to 80mg  daily Decrease Amiodarone to 100mg  at bedtime. Will schedule CT scan to assess groin abscess. Return 2 month for Echo and visit with Dr. Shirlee Latch.

## 2023-02-11 NOTE — Progress Notes (Addendum)
Patient presents for hosp f/u in [facility] with wife Nicole Cella. Denies issues/concerns with VAD equipment or drive line.   Patient ambulated in to clinic independently without difficulty. Denies lightheadedness, dizziness, falls, shortness of breath, and signs of bleeding. States he is remaining active. Patient complains of increase fatigue throughout the day with occasional nausea. Patient has not taken any Zofran for nausea. He states he approximately drink four 4oz bottle of water a day and when he drinks more he feels heaviness in his abdomen. Reports he has been taking Lasix 60 mg daily.   Patient is currently on Amiodarone 200 mg daily. Thyroid panel drawn today. Referral to Endocrinology sent last visit. Patient is scheduled to see Dr. Fransico Him 7/15.  Patient to the hospital for a left groin abscess in March. The wound culture was negative. He has completed his p.o. antibiotics as of 12/11/22. Patient states he feels like the abscess is larger than when discharged but denies redness, tenderness or drainage from site.  Dicussed concerns with Dr. Shirlee Latch. Will plan to increase Lasix to 80mg  daily and obtain Echo next visit. Along with decreasing Amiodarone to 100mg  daily with instructions to take at night. Will also plan for CT Pelvis WO to access groin site. If site does not get better will have Dr. Maren Beach access.  Vital Signs:  Doppler Pressure: 98 Automatic BP: 112/58 (78)  HR: 68  SPO2: 99% on RA   Weight: 193 lb w/ eqt Last weight: 190.2 lb w/eqt   VAD Indication: Destination Therapy due to 72 and kidney disease   LVAD assessment:  HM III: Speed: 5300 rpms Flow: 4.4 Power: 3.8 w    PI: 3.3 Alarms: none Events: rare PI events  Fixed speed: 5300 Low speed limit: 5000  Primary controller: back up battery due for replacement in 22 months Secondary controller:  back up battery due for replacement in 17 months   I reviewed the LVAD parameters from today and compared the results to  the patient's prior recorded data. LVAD interrogation was NEGATIVE for significant power changes, NEGATIVE for clinical alarms and STABLE for PI events/speed drops. No programming changes were made and pump is functioning within specified parameters. Pt is performing daily controller and system monitor self tests along with completing weekly and monthly maintenance for LVAD equipment.   LVAD equipment check completed and is in good working order. Back-up equipment present. Charged back up battery and performed self-test on equipment.    Annual Equipment Maintenance on UBC/PM was performed on 09/2022.   Exit Site Care: Wife has been performing weekly dressing changes using weekly dressings. Drive line anchor correctly applied.  Pt given 8 weekly kits and 12 anchors for home use.  Device:  [employer] dual  Therapies: on Pacing: AV paced 60 >> increased to 70  Last check: 05/21/22 in [facility]   BP & Labs:  Doppler 98 - Doppler is reflecting Modified systolic   Hgb 11.5 - No S/S of bleeding. Specifically denies melena/BRBPR or nosebleeds.   LDH stable at 198 with established baseline of 200 - 300. Denies tea-colored urine. No power elevations noted on interrogation.    Patient Instructions:  Increase Lasix to 80mg  daily Decrease Amiodarone to 100mg  at bedtime. Will schedule CT scan to assess groin abscess. Return 2 month for Echo and visit with Dr. Shirlee Latch.  Simmie Davies RN,BSN VAD Coordinator  Office: [phone]  24/7 Pager: [phone]        Cardiology: Dr. Shirlee Latch PCP: Jerl Mina, MD  Follow up for Heart Failure/LVAD: 72 y.o. with history of CAD, HTN, atrial fibrillation, and chronic systolic CHF returns for followup of CHF/LVAD.   Patient has had a long-standing cardiomyopathy, EF had been in the 25% range for years.  He had PCI to OM1 in 2007 and RCA in 2013.  From his report, these episodes do not sound like ACS events.  Echo in 12/22 showed EF < 20%, severe LV  dilation, restrictive diastolic function, moderate RV dysfunction, moderate MR, mod-severe TR. HF was been complicated by CKD stage 3. He was also noted on device interrogation to have been in atrial fibrillation persistently since 10/22.  He was admitted at [facility] in 71/22.  BP was low, his BP-active meds were stopped and midodrine was begun.  He was diuresed and discharged home.    He reports ongoing severe dyspnea and was admitted again in 12/22 after he stood up then passed out at home.  Device interrogation showed run of VT treated x 2 with ATP possibly around the time of syncope.  RHC in 12/22 showed low output HF and he developed cardiogenic shock. Impella 5.5 was placed.  On 09/17/21, HM3 LVAD was placed, ICD leads were plastered to the tricuspid valve with severe TR, the valve was not replaced.  The patient had significant interoperative hypotension.  Post-operatively, he developed progressive renal failure and was started on CVVH then iHD.  He had a bumpy post-op course with renal failure and RV failure, but was ultimately able to discharge home on 10/24/21.   Ramp echo in 3/23, RV mildly dilated with normal systolic function, severe TR, LV EF 20-25%, aortic valve opens every beat.  Speed increased to 5300 rpm. The IV septum was mildly leftwards at 5300 rpm but flow increased and AoV still opened every beat.   Patient was admitted in 3/24 with left groin abscess.  This spontaneously drained and was treated with IV antibiotics.  He was sent home on linezolid and Augmentin.  He developed profuse diarrhea that he attributed to linezolid.  Linezolid was stopped and he was continued on Augmentin.   Patient returns for LVAD followup. He has noted some wheezing at night.  Fatigues easily.  No dyspnea walking on flat ground.  No fever/chills. He feels like he has more swelling in his left groin.  No drainage.  MAP 78, weight up 3 lbs.    Denies LVAD alarms.  Denies driveline trauma, erythema or drainage.   Denies ICD shocks.   Reports taking Coumadin as prescribed and adherence to anticoagulation based dietary restrictions.  Denies bright red blood per rectum or melena, no dark urine or hematuria.    Labs (3/23): LFTs normal, TSH mildly elevated Labs (7/23): hgb 12.1 Labs (8/23): hgb 11.8 => 9.9, K 4.4, creatinine 2.5 => 3.09, LFTs normal.  Labs (11/23): K 4.5, creatinine 2.95 Labs (1/24): TSH 5.2 Labs (3/24): hgb 10.7, LDH 157, LFTs normal, creatinine 2.75  PMH: 1. HTN 2. Hyperlipidemia 3. CAD: H/o PCI to OM1 2007 and PCI to RCA in 2013.  Uncertain of circumstances but do not sound like ACS events.  4. CKD stage IV: Patient was on HD for several months post-LVAD but was able to come off.  5. Atrial fibrillation: Paroxysmal 6. Chronic systolic CHF: Long-standing cardiomyopathy with EF in the 25% range.  Cause uncertain, has history of PCI to RCA and OM1, but degree of CAD does not seem to explain severity of cardiomyopathy. Father had history of CAD/CHF.  Not heavy drinker.  Has [employer] ICD.  - Echo (12/22): EF < 20%, severe LV dilation, restrictive diastolic function, moderate RV dysfunction, moderate MR, mod-severe TR.  - 12/22 cardiogenic shock with Impella 5.5 placement then Heartmate 3 LVAD.  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  7. VT 8. RV failure/severe TR 9. Citrobacter UTI (8/23) 20. Left groin abscess 3/24   Current Outpatient Medications  Medication Sig Dispense Refill   ondansetron (ZOFRAN) 4 MG tablet Take 4 mg by mouth every 8 (eight) hours as needed for nausea or vomiting.     pantoprazole (PROTONIX) 40 MG tablet TAKE 1 TABLET BY MOUTH EVERY DAY (Patient taking differently: Take 40 mg by mouth daily before breakfast.) 90 tablet 3   rosuvastatin (CRESTOR) 10 MG tablet TAKE 1 TABLET BY MOUTH EVERY DAY (Patient taking differently: Take 10 mg by mouth at bedtime.) 90 tablet 3   sildenafil (REVATIO) 20 MG tablet Take 1 tablet  (20 mg total) by mouth 3 (three) times daily. 90 tablet 11   warfarin (COUMADIN) 2 MG tablet Take 1 tablet (2 mg total) by mouth daily at 4 PM. 30 tablet 5   albuterol (VENTOLIN HFA) 108 (90 Base) MCG/ACT inhaler Inhale 2 puffs into the lungs every 6 (six) hours as needed. (Patient not taking: Reported on 02/11/2023) 1 each 3   amiodarone (PACERONE) 200 MG tablet Take 0.5 tablets (100 mg total) by mouth daily. 100mg  daily at bedtime 30 tablet 6   amoxicillin-clavulanate (AUGMENTIN) 500-125 MG tablet Take 1 tablet by mouth every 12 (twelve) hours. (Patient not taking: Reported on 02/11/2023) 19 tablet 0   furosemide (LASIX) 40 MG tablet Take 2 tablets (80 mg total) by mouth daily. 70 tablet 3   No current facility-administered medications for this encounter.    Mushroom extract complex, Doxycycline, Neosporin [neomycin-bacitracin zn-polymyx], and Tape  REVIEW OF SYSTEMS: All systems negative except as listed in HPI, PMH and Problem list.   LVAD INTERROGATION:  See LVAD nurse's note above.   I reviewed the LVAD parameters from today, and compared the results to the patient's prior recorded data.  No programming changes were made.  The LVAD is functioning within specified parameters.  The patient performs LVAD self-test daily.  LVAD interrogation was negative for any significant power changes, alarms or PI events/speed drops.  LVAD equipment check completed and is in good working order.  Back-up equipment present.   LVAD education done on emergency procedures and precautions and reviewed exit site care.    Vitals:   02/11/23 1117  BP: (!) 112/58  Pulse: 68  SpO2: 99%  Weight: 87.5 kg (193 lb)  MAP 78  Physical Exam: General: Well appearing this am. NAD.  HEENT: Normal. Neck: Supple, JVP 9-10 cm. Carotids OK.  Cardiac:  Mechanical heart sounds with LVAD hum present.  Lungs:  CTAB, normal effort.  Abdomen:  NT, ND, no HSM. No bruits or masses. +BS  LVAD exit site: Well-healed and  incorporated. Dressing dry and intact. No erythema or drainage. Stabilization device present and accurately applied. Driveline dressing changed daily per sterile technique. Extremities:  Warm and dry. No cyanosis, clubbing, rash, or edema.  Neuro:  Alert & oriented x 3. Cranial nerves grossly intact. Moves all 4 extremities w/o difficulty. Affect pleasant    ASSESSMENT AND PLAN: 1. Chronic systolic CHF:  Long-standing cardiomyopathy.  [employer] ICD.  Echo in 12/22 with EF < 20%, severe LV dilation, restrictive diastolic function, moderate RV dysfunction, moderate  MR, mod-severe TR. Cause of cardiomyopathy is uncertain.  He has a history of CAD, but I do not think that the described CAD from the past could explain his cardiomyopathy, but CAD could have progressed.  He was admitted in 12/22 with cardiogenic shock and Impella 5.5. s/p HM III VAD on 09/17/22.  Post-op course complicated by AKI and RV failure. He was on HD for several months but is now off.  NYHA class II-III with mild volume overload on exam. He has severe TR.   - Increase Lasix to 80 mg daily with BMET today and in 10 days.  - Continue sildenafil 20 tid for RV.   - Warfarin for INR 2-2.5.   - His [employer] device is at [employer].  He has never been shocked.  We discussed generator replacement vs no replacement.  He is inclined to avoid further procedures, so I told him that I thought it would be reasonable not to replace generator.  2. Tricuspid regurgitation:  Tricuspid repair not done at time of VAD due to proximity of ICD wires and hypotension during surgery.  He has severe TR.  3. Atrial fibrillation: Regular rhythm. - Decrease amiodarone to 100 mg daily, check LFTs and TSH.  He will need a regular eye exam. 4. CKD stage IV: Suspect intra-op hypotension at the time of LVAD placement led to development of ATN => urine sediment looked like ATN per renal. Had initial CVVH and transitioned over to HD. He is now off HD with  creatinine stable, most recently 2.75.  - BMET today.  - Follow with nephrology.  5. CAD: History of PCI to OM1 in 2007 and RCA in 2013.  No CP or ACs.  - Deferred cardiac cath in 12/22 due to AKI and plan for VAD - Continue Crestor.   6. Abnormal thyroid function tests: Patient has had elevated TSH and free T4, mixed pattern. He is on amiodarone.  - He has appointment with endocrinology.  - Check thyroid indices.  - Decreasing amiodarone as above.  7. Groin abscess: He completed a course of antibiotics.  He feels like he has more swelling in the left groin, wonders if he has a hernia.  No fever/chills/drainage.   - Will arrange noncontrast CT left groin.   Followup in 2 months.   Marca Ancona 02/11/2023

## 2023-02-12 ENCOUNTER — Other Ambulatory Visit (HOSPITAL_COMMUNITY): Payer: Self-pay | Admitting: Cardiology

## 2023-02-12 DIAGNOSIS — Z95811 Presence of heart assist device: Secondary | ICD-10-CM

## 2023-02-12 DIAGNOSIS — I5022 Chronic systolic (congestive) heart failure: Secondary | ICD-10-CM

## 2023-02-13 LAB — T3, FREE: T3, Free: 2.3 pg/mL (ref 2.0–4.4)

## 2023-02-18 ENCOUNTER — Ambulatory Visit
Admission: RE | Admit: 2023-02-18 | Discharge: 2023-02-18 | Disposition: A | Discharge status: Home / Self Care | Payer: Medicare HMO | Source: Ambulatory Visit | Attending: Cardiology | Admitting: Cardiology

## 2023-02-18 ENCOUNTER — Ambulatory Visit (HOSPITAL_COMMUNITY): Payer: Self-pay | Admitting: Pharmacist

## 2023-02-18 DIAGNOSIS — K4091 Unilateral inguinal hernia, without obstruction or gangrene, recurrent: Secondary | ICD-10-CM | POA: Insufficient documentation

## 2023-02-18 LAB — POCT INR: INR: 1.9 — AB (ref 2.0–3.0)

## 2023-02-26 ENCOUNTER — Ambulatory Visit (HOSPITAL_COMMUNITY): Payer: Self-pay | Admitting: Pharmacist

## 2023-02-26 LAB — POCT INR: INR: 2.5 (ref 2.0–3.0)

## 2023-03-04 ENCOUNTER — Telehealth (HOSPITAL_COMMUNITY): Payer: Self-pay | Admitting: *Deleted

## 2023-03-04 NOTE — Telephone Encounter (Signed)
Received call from pt's wife reporting pt is complaining of intermittent nausea with "burning" in his stomach and back of throat. Pt reports regular bowel movement. Denies shortness of breath, urinary symptoms, fever, chills, and VAD alarms. Reports he is fatigued, but they have been traveling quite a bit over the last few weeks. Pt was advised last clinic visit to eat with medication- he reports he has been doing so. He was advised to take Amiodarone at bedtime, but he did not feel this was helpful so is taking in the morning. Has PRN Zofran prescription but has not taken any.   Taking Pantoprazole 40 mg daily. Will trail increasing to BID. Advised he may take Tums/Maalox as needed. Advised he may take Zofran as needed. Advised to notify VAD coordinators if symptoms do not resolve. Both pt and wife verbalized understanding.   Alyce Pagan RN VAD Coordinator  Office: [phone]  24/7 Pager: [phone]

## 2023-03-05 LAB — POCT INR: INR: 2.1 (ref 2.0–3.0)

## 2023-03-09 NOTE — Progress Notes (Signed)
Remote ICD transmission.   

## 2023-03-11 ENCOUNTER — Ambulatory Visit: Payer: Medicare HMO

## 2023-03-11 DIAGNOSIS — I428 Other cardiomyopathies: Secondary | ICD-10-CM

## 2023-03-11 LAB — CUP PACEART REMOTE DEVICE CHECK
Battery Remaining Longevity: 5 mo
Battery Remaining Percentage: 5 %
Brady Statistic RA Percent Paced: 0 %
Brady Statistic RV Percent Paced: 5 %
Date Time Interrogation Session: 20240620031100
HighPow Impedance: 49 Ohm
Implantable Lead Connection Status: 753985
Implantable Lead Connection Status: 753985
Implantable Lead Implant Date: 20140205
Implantable Lead Implant Date: 20140205
Implantable Lead Location: 753859
Implantable Lead Location: 753860
Implantable Lead Model: 296
Implantable Lead Model: 4470
Implantable Lead Serial Number: 727876
Implantable Pulse Generator Implant Date: 20140205
Lead Channel Impedance Value: 352 Ohm
Lead Channel Impedance Value: 440 Ohm
Lead Channel Pacing Threshold Amplitude: 1.5 V
Lead Channel Pacing Threshold Amplitude: 3.4 V
Lead Channel Pacing Threshold Pulse Width: 0.5 ms
Lead Channel Pacing Threshold Pulse Width: 2 ms
Lead Channel Setting Pacing Amplitude: 3.5 V
Lead Channel Setting Pacing Pulse Width: 0.5 ms
Lead Channel Setting Sensing Sensitivity: 0.6 mV
Pulse Gen Serial Number: 113490
Zone Setting Status: 755011

## 2023-03-12 LAB — POCT INR: INR: 3.1 — AB (ref 2.0–3.0)

## 2023-03-15 ENCOUNTER — Ambulatory Visit (HOSPITAL_COMMUNITY): Payer: Self-pay | Admitting: Pharmacist

## 2023-03-18 ENCOUNTER — Ambulatory Visit (HOSPITAL_COMMUNITY): Payer: Self-pay | Admitting: Pharmacist

## 2023-03-18 LAB — POCT INR: INR: 2.6 (ref 2.0–3.0)

## 2023-03-26 ENCOUNTER — Ambulatory Visit (HOSPITAL_COMMUNITY): Payer: Self-pay | Admitting: Student-PharmD

## 2023-03-26 LAB — POCT INR: INR: 2.3 (ref 2.0–3.0)

## 2023-03-31 NOTE — Progress Notes (Signed)
Remote ICD transmission.   

## 2023-04-01 ENCOUNTER — Ambulatory Visit (HOSPITAL_COMMUNITY): Payer: Self-pay | Admitting: Pharmacist

## 2023-04-01 LAB — POCT INR: INR: 1.7 — AB (ref 2.0–3.0)

## 2023-04-05 ENCOUNTER — Ambulatory Visit (INDEPENDENT_AMBULATORY_CARE_PROVIDER_SITE_OTHER): Payer: Medicare HMO | Admitting: "Endocrinology

## 2023-04-05 ENCOUNTER — Encounter: Payer: Self-pay | Admitting: "Endocrinology

## 2023-04-05 ENCOUNTER — Other Ambulatory Visit (HOSPITAL_COMMUNITY): Payer: Self-pay

## 2023-04-05 ENCOUNTER — Telehealth (HOSPITAL_COMMUNITY): Payer: Self-pay

## 2023-04-05 VITALS — Ht 69.0 in | Wt 189.6 lb

## 2023-04-05 DIAGNOSIS — R7989 Other specified abnormal findings of blood chemistry: Secondary | ICD-10-CM | POA: Insufficient documentation

## 2023-04-05 NOTE — Telephone Encounter (Signed)
Pt's wife paged VAD Coordinator stating pt has been experiencing 4 days of continuous chest pain along with nausea and gagging. Declines taking Zofran for relief. Wife states " I cannot get him to take anything". She states he has been in bed constantly but she was able to get him up for his Endocrinology appointment today in [place]. Denies fever or chills. Discussed with Dr. Shirlee Latch. Advised for pt to come for evaluation at [facility] ED after appointment. Pt's wife states " I will see if I can get him to come". VAD Coordinator stressed importance in seeking care.  Simmie Davies RN, BSN VAD Coordinator 24/7 Pager [phone]

## 2023-04-05 NOTE — Progress Notes (Signed)
Endocrinology Consult Note                                            04/05/2023, 4:54 PM   Subjective:    Patient ID: Isaac [name], male    DOB: [birth year], PCP Jerl Mina, MD   Past Medical History:  Diagnosis Date   Arrhythmia    atrial fibrillation   CHF (congestive heart failure) (HCC)    Chronic kidney disease    Coronary artery disease    Hyperlipidemia    Hypertension    Myocardial infarct [facility])    Past Surgical History:  Procedure Laterality Date   CARDIAC DEFIBRILLATOR PLACEMENT  feb 2014   INSERTION OF IMPLANTABLE LEFT VENTRICULAR ASSIST DEVICE N/A 09/17/2021   Procedure: INSERTION OF IMPLANTABLE LEFT VENTRICULAR ASSIST DEVICE AND INSERTION OF FEMORAL ARTERIAL LINE;  Surgeon: Alleen Borne, MD;  Location: [facility] OR;  Service: Open Heart Surgery;  Laterality: N/A;   IR FLUORO GUIDE CV LINE RIGHT  09/27/2021   IR THORACENTESIS ASP PLEURAL SPACE W/IMG GUIDE  09/27/2021   IR US GUIDE VASC ACCESS RIGHT  09/27/2021   PLACEMENT OF IMPELLA LEFT VENTRICULAR ASSIST DEVICE N/A 09/12/2021   Procedure: PLACEMENT OF IMPELLA 5.5 LEFT VENTRICULAR ASSIST DEVICE;  Surgeon: Loreli Slot, MD;  Location: [facility] OR;  Service: Open Heart Surgery;  Laterality: N/A;   RIGHT HEART CATH N/A 09/08/2021   Procedure: RIGHT HEART CATH;  Surgeon: Laurey Morale, MD;  Location: [facility] INVASIVE CV LAB;  Service: Cardiovascular;  Laterality: N/A;   TEE WITHOUT CARDIOVERSION N/A 09/12/2021   Procedure: TRANSESOPHAGEAL ECHOCARDIOGRAM (TEE);  Surgeon: Loreli Slot, MD;  Location: [facility] OR;  Service: Open Heart Surgery;  Laterality: N/A;   TEE WITHOUT CARDIOVERSION N/A 09/17/2021   Procedure: TRANSESOPHAGEAL ECHOCARDIOGRAM (TEE);  Surgeon: Alleen Borne, MD;  Location: [facility] OR;  Service: Open Heart Surgery;  Laterality: N/A;   Social History   Socioeconomic History   Marital status: Married    Spouse name: Not on file   Number of children: Not on file   Years of education: Not on  file   Highest education level: Not on file  Occupational History   Not on file  Tobacco Use   Smoking status: Former   Smokeless tobacco: Never  Substance and Sexual Activity   Alcohol use: No   Drug use: No   Sexual activity: Not on file  Other Topics Concern   Not on file  Social History Narrative   Not on file   Social Determinants of Health   Financial Resource Strain: Not on file  Food Insecurity: Not on file  Transportation Needs: Not on file  Physical Activity: Not on file  Stress: Not on file  Social Connections: Not on file   Family History  Problem Relation Age of Onset   Hypertension Mother    Hypertension Father    Hyperlipidemia Father    Heart attack Father    Heart failure Father    Outpatient Encounter Medications as of 04/05/2023  Medication Sig   albuterol (VENTOLIN HFA) 108 (90 Base) MCG/ACT inhaler Inhale 2 puffs into the lungs every 6 (six) hours as needed. (Patient not taking: Reported on 02/11/2023)   amiodarone (PACERONE) 200 MG tablet Take 0.5 tablets (100 mg total) by mouth daily. 100mg  daily at bedtime (Patient taking differently:  Take 100 mg by mouth daily.)   furosemide (LASIX) 40 MG tablet Take 2 tablets (80 mg total) by mouth daily. (Patient taking differently: Take 60 mg by mouth daily.)   ondansetron (ZOFRAN) 4 MG tablet Take 4 mg by mouth every 8 (eight) hours as needed for nausea or vomiting.   pantoprazole (PROTONIX) 40 MG tablet TAKE 1 TABLET BY MOUTH EVERY DAY (Patient taking differently: Take 40 mg by mouth daily before breakfast.)   rosuvastatin (CRESTOR) 10 MG tablet TAKE 1 TABLET BY MOUTH EVERY DAY (Patient taking differently: Take 10 mg by mouth at bedtime.)   sildenafil (REVATIO) 20 MG tablet Take 1 tablet (20 mg total) by mouth 3 (three) times daily.   warfarin (COUMADIN) 2 MG tablet Take 1 tablet (2 mg total) by mouth daily at 4 PM.   [DISCONTINUED] amoxicillin-clavulanate (AUGMENTIN) 500-125 MG tablet Take 1 tablet by mouth  every 12 (twelve) hours. (Patient not taking: Reported on 02/11/2023)   No facility-administered encounter medications on file as of 04/05/2023.   ALLERGIES: Allergies  Allergen Reactions   Mushroom Extract Complex Nausea And Vomiting   Doxycycline Nausea And Vomiting   Neosporin [Neomycin-Bacitracin Zn-Polymyx] Hives   Tape Other (See Comments)    Some tapes/dressings can irritate the skin    VACCINATION STATUS:  There is no immunization history on file for this patient.  HPI Isaac [name] is 72 y.o. male who presents today with a medical history as above. he is being seen in consultation for abnormal thyroid function tests requested by Jerl Mina, MD.  He is accompanied by his wife to clinic.  Patient with multiple medical problems including coronary artery disease, CVA, CKD, CHF, tachyarrhythmia.  She was recently found to have fluctuating thyroid function tests including times of high TSH and most recently elevated free T4.  Of note, patient has been on amiodarone for the last year, currently 100 mg p.o. daily. He currently denies palpitations, heat intolerance.  He is gaining weight, recently gaining 10 pounds. He has CHF with recent ejection fraction of 25%, in December 2022 underwent HM 3 LVAD placement. He does not exert much at baseline. He denies dysphagia, shortness of breath, nor voice change.  He denies family history of thyroid dysfunction or thyroid malignancy. He was not put on any thyroid hormone nor antithyroid medications so far.  He is a former smoker.  He is also known to have hyperlipidemia and hypertension.    Review of Systems  Constitutional: + Recent weight gain,  +fatigue, no subjective hyperthermia, no subjective hypothermia Eyes: no blurry vision, no xerophthalmia ENT: no sore throat, no nodules palpated in throat, no dysphagia/odynophagia, no hoarseness Cardiovascular: no Chest Pain, no Shortness of Breath, no palpitations, no leg  swelling Respiratory: no cough, no shortness of breath Gastrointestinal: no Nausea/Vomiting/Diarhhea Musculoskeletal: no muscle/joint aches Skin: no rashes Neurological: no tremors, no numbness, no tingling, no dizziness Psychiatric: no depression, no anxiety  Objective:       04/05/2023    1:35 PM 02/11/2023   11:17 AM 12/09/2022   11:22 AM  Vitals with BMI  Height 5\' 9"     Weight 189 lbs 10 oz 193 lbs   BMI 27.99    Systolic  112 84  Diastolic  58 0  Pulse  68     Ht 5\' 9"  (1.753 m)   Wt 189 lb 9.6 oz (86 kg)   BMI 28.00 kg/m   Wt Readings from Last 3 Encounters:  04/05/23 189 lb 9.6 oz (  86 kg)  02/11/23 193 lb (87.5 kg)  12/09/22 190 lb 3.2 oz (86.3 kg)    Physical Exam  Constitutional:  Body mass index is 28 kg/m.,  not in acute distress, normal state of mind Eyes: PERRLA, EOMI, no exophthalmos ENT: moist mucous membranes, no gross thyromegaly, no gross cervical lymphadenopathy Cardiovascular: normal precordial activity, Regular Rate and Rhythm, no Murmur/Rubs/Gallops Respiratory:  + Dyspnea on exertion,  no gross chest deformity, Clear to auscultation bilaterally Gastrointestinal: abdomen soft, Non -tender, No distension, Bowel Sounds present, no gross organomegaly Musculoskeletal: no gross deformities, strength intact in all four extremities, no peripheral edema Skin: moist, warm, no rashes Neurological: no tremor with outstretched hands, Deep tendon reflexes normal in bilateral lower extremities.  CMP ( most recent) CMP     Component Value Date/Time   NA 140 02/11/2023 1050   NA 141 07/06/2014 1425   K 3.6 02/11/2023 1050   K 3.6 07/06/2014 1425   CL 107 02/11/2023 1050   CL 110 (H) 07/06/2014 1425   CO2 21 (L) 02/11/2023 1050   CO2 30 07/06/2014 1425   GLUCOSE 81 02/11/2023 1050   GLUCOSE 93 07/06/2014 1425   BUN 29 (H) 02/11/2023 1050   BUN 15 07/06/2014 1425   CREATININE 2.70 (H) 02/11/2023 1050   CREATININE 1.45 (H) 07/06/2014 1425   CALCIUM 8.8  (L) 02/11/2023 1050   CALCIUM 8.5 07/06/2014 1425   PROT 7.1 02/11/2023 1051   ALBUMIN 3.6 02/11/2023 1051   AST 34 02/11/2023 1051   ALT 34 02/11/2023 1051   ALKPHOS 116 02/11/2023 1051   BILITOT 1.2 02/11/2023 1051   GFRNONAA 25 (L) 02/11/2023 1050   GFRNONAA 52 (L) 07/06/2014 1425     Diabetic Labs (most recent): Lab Results  Component Value Date   HGBA1C 6.1 (H) 08/23/2021     Lipid Panel ( most recent) Lipid Panel     Component Value Date/Time   CHOL 94 09/08/2021 0141   TRIG 54 09/08/2021 0141   HDL 25 (L) 09/08/2021 0141   CHOLHDL 3.8 09/08/2021 0141   VLDL 11 09/08/2021 0141   LDLCALC 58 09/08/2021 0141      Lab Results  Component Value Date   TSH 4.095 02/11/2023   TSH 4.992 (H) 12/09/2022   TSH 5.244 (H) 10/07/2022   TSH 4.695 (H) 04/07/2022   TSH 4.798 (H) 01/20/2022   FREET4 1.77 (H) 02/11/2023   FREET4 1.48 (H) 12/09/2022   FREET4 1.54 (H) 10/07/2022   FREET4 1.49 (H) 01/20/2022   FREET4 1.56 (H) 09/13/2021           Assessment & Plan:   1. Abnormal thyroid blood test    - Isaac [name]  is being seen at a kind request of Jerl Mina, MD. - I have reviewed his available thyroid records and clinically evaluated the patient. 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  -Before committing him to antithyroid treatment, he will need confirmatory study with thyroid uptake and scan. He will return with this study in 2 weeks for treatment decision.  If he is found to have significant uptake, he will be considered for ablative treatment with radioactive iodine. - he is advised to maintain close follow up with Jerl Mina, MD for primary care needs.   -Thank you for involving me in the care of this pleasant patient.  Time spent with the patient: 45  minutes, of which >50% was spent in  counseling him about his abnormal thyroid function test while on amiodarone  and the rest in obtaining information about his symptoms, reviewing his previous labs/studies ( including abstractions from other facilities),  evaluations, and treatments,  and developing a plan to confirm diagnosis and long term treatment based on the latest standards of care/guidelines; and documenting his care.  Isaac [name] participated in the discussions, expressed understanding, and voiced agreement with the above plans.  All questions were answered to his satisfaction. he is encouraged to contact clinic should he have any questions or concerns prior to his return visit.  Follow up plan: Return in about 2 weeks (around 04/19/2023) for F/U with Thyroid Uptake and Scan.   Marquis Lunch, MD [facility] [address] [place], [place] [address] Phone: [phone]  Fax: [phone]     04/05/2023, 4:54 PM  This note was partially dictated with voice recognition software. Similar sounding words can be transcribed inadequately or may not  be corrected upon review.

## 2023-04-09 LAB — POCT INR: INR: 2.1 (ref 2.0–3.0)

## 2023-04-12 ENCOUNTER — Ambulatory Visit (HOSPITAL_COMMUNITY): Payer: Self-pay | Admitting: Pharmacist

## 2023-04-12 ENCOUNTER — Encounter
Admission: RE | Admit: 2023-04-12 | Discharge: 2023-04-12 | Disposition: A | Discharge status: Home / Self Care | Payer: Medicare HMO | Source: Ambulatory Visit | Attending: "Endocrinology | Admitting: "Endocrinology

## 2023-04-12 ENCOUNTER — Other Ambulatory Visit (HOSPITAL_COMMUNITY): Payer: Self-pay | Admitting: *Deleted

## 2023-04-12 ENCOUNTER — Ambulatory Visit (INDEPENDENT_AMBULATORY_CARE_PROVIDER_SITE_OTHER): Payer: Medicare HMO

## 2023-04-12 DIAGNOSIS — I428 Other cardiomyopathies: Secondary | ICD-10-CM

## 2023-04-12 DIAGNOSIS — R7989 Other specified abnormal findings of blood chemistry: Secondary | ICD-10-CM | POA: Diagnosis present

## 2023-04-12 DIAGNOSIS — Z79899 Other long term (current) drug therapy: Secondary | ICD-10-CM

## 2023-04-12 DIAGNOSIS — Z95811 Presence of heart assist device: Secondary | ICD-10-CM

## 2023-04-12 DIAGNOSIS — Z7901 Long term (current) use of anticoagulants: Secondary | ICD-10-CM

## 2023-04-12 LAB — CUP PACEART REMOTE DEVICE CHECK
Battery Remaining Longevity: 3 mo — CL
Battery Remaining Percentage: 2 %
Brady Statistic RA Percent Paced: 0 %
Brady Statistic RV Percent Paced: 7 %
Date Time Interrogation Session: 20240722070800
HighPow Impedance: 52 Ohm
Implantable Lead Connection Status: 753985
Implantable Lead Connection Status: 753985
Implantable Lead Implant Date: 20140205
Implantable Lead Implant Date: 20140205
Implantable Lead Location: 753859
Implantable Lead Location: 753860
Implantable Lead Model: 296
Implantable Lead Model: 4470
Implantable Lead Serial Number: 727876
Implantable Pulse Generator Implant Date: 20140205
Lead Channel Impedance Value: 343 Ohm
Lead Channel Impedance Value: 418 Ohm
Lead Channel Pacing Threshold Amplitude: 1.5 V
Lead Channel Pacing Threshold Amplitude: 3.4 V
Lead Channel Pacing Threshold Pulse Width: 0.5 ms
Lead Channel Pacing Threshold Pulse Width: 2 ms
Lead Channel Setting Pacing Amplitude: 3.5 V
Lead Channel Setting Pacing Pulse Width: 0.5 ms
Lead Channel Setting Sensing Sensitivity: 0.6 mV
Pulse Gen Serial Number: 113490
Zone Setting Status: 755011

## 2023-04-12 MED ORDER — SODIUM IODIDE I-123 7.4 MBQ CAPS
467.3000 | ORAL_CAPSULE | Freq: Once | ORAL | Status: AC
  Administered 2023-04-12: 467.3 via ORAL

## 2023-04-13 ENCOUNTER — Ambulatory Visit
Admission: RE | Admit: 2023-04-13 | Discharge: 2023-04-13 | Disposition: A | Discharge status: Home / Self Care | Payer: Medicare HMO | Source: Ambulatory Visit | Attending: "Endocrinology | Admitting: "Endocrinology

## 2023-04-15 ENCOUNTER — Other Ambulatory Visit: Payer: Self-pay

## 2023-04-15 ENCOUNTER — Ambulatory Visit (HOSPITAL_COMMUNITY)
Admission: RE | Admit: 2023-04-15 | Discharge: 2023-04-15 | Disposition: A | Discharge status: Home / Self Care | Payer: Medicare HMO | Source: Ambulatory Visit | Attending: Cardiology | Admitting: Cardiology

## 2023-04-15 ENCOUNTER — Other Ambulatory Visit (HOSPITAL_COMMUNITY): Payer: Self-pay | Admitting: *Deleted

## 2023-04-15 ENCOUNTER — Ambulatory Visit (HOSPITAL_COMMUNITY): Payer: Self-pay

## 2023-04-15 ENCOUNTER — Ambulatory Visit (HOSPITAL_BASED_OUTPATIENT_CLINIC_OR_DEPARTMENT_OTHER)
Admission: RE | Admit: 2023-04-15 | Discharge: 2023-04-15 | Disposition: A | Discharge status: Home / Self Care | Payer: Medicare HMO | Source: Ambulatory Visit | Attending: Cardiology | Admitting: Cardiology

## 2023-04-15 ENCOUNTER — Encounter (HOSPITAL_COMMUNITY): Payer: Self-pay | Admitting: Cardiology

## 2023-04-15 DIAGNOSIS — I2510 Atherosclerotic heart disease of native coronary artery without angina pectoris: Secondary | ICD-10-CM | POA: Insufficient documentation

## 2023-04-15 DIAGNOSIS — Z95811 Presence of heart assist device: Secondary | ICD-10-CM | POA: Insufficient documentation

## 2023-04-15 DIAGNOSIS — I4891 Unspecified atrial fibrillation: Secondary | ICD-10-CM | POA: Diagnosis not present

## 2023-04-15 DIAGNOSIS — R1319 Other dysphagia: Secondary | ICD-10-CM | POA: Insufficient documentation

## 2023-04-15 DIAGNOSIS — N184 Chronic kidney disease, stage 4 (severe): Secondary | ICD-10-CM | POA: Diagnosis not present

## 2023-04-15 DIAGNOSIS — R946 Abnormal results of thyroid function studies: Secondary | ICD-10-CM | POA: Diagnosis not present

## 2023-04-15 DIAGNOSIS — I5022 Chronic systolic (congestive) heart failure: Secondary | ICD-10-CM | POA: Diagnosis present

## 2023-04-15 DIAGNOSIS — I361 Nonrheumatic tricuspid (valve) insufficiency: Secondary | ICD-10-CM | POA: Insufficient documentation

## 2023-04-15 DIAGNOSIS — R110 Nausea: Secondary | ICD-10-CM

## 2023-04-15 DIAGNOSIS — I130 Hypertensive heart and chronic kidney disease with heart failure and stage 1 through stage 4 chronic kidney disease, or unspecified chronic kidney disease: Secondary | ICD-10-CM | POA: Diagnosis not present

## 2023-04-15 DIAGNOSIS — R1310 Dysphagia, unspecified: Secondary | ICD-10-CM

## 2023-04-15 DIAGNOSIS — R140 Abdominal distension (gaseous): Secondary | ICD-10-CM

## 2023-04-15 DIAGNOSIS — Z7901 Long term (current) use of anticoagulants: Secondary | ICD-10-CM | POA: Diagnosis not present

## 2023-04-15 LAB — CBC
HCT: 36.3 % — ABNORMAL LOW (ref 39.0–52.0)
Hemoglobin: 11.7 g/dL — ABNORMAL LOW (ref 13.0–17.0)
MCH: 29.4 pg (ref 26.0–34.0)
MCHC: 32.2 g/dL (ref 30.0–36.0)
MCV: 91.2 fL (ref 80.0–100.0)
Platelets: 246 10*3/uL (ref 150–400)
RBC: 3.98 MIL/uL — ABNORMAL LOW (ref 4.22–5.81)
RDW: 16.1 % — ABNORMAL HIGH (ref 11.5–15.5)
WBC: 4.6 10*3/uL (ref 4.0–10.5)
nRBC: 0 % (ref 0.0–0.2)

## 2023-04-15 LAB — COMPREHENSIVE METABOLIC PANEL
ALT: 20 U/L (ref 0–44)
AST: 21 U/L (ref 15–41)
Albumin: 2.8 g/dL — ABNORMAL LOW (ref 3.5–5.0)
Alkaline Phosphatase: 125 U/L (ref 38–126)
Anion gap: 8 (ref 5–15)
BUN: 23 mg/dL (ref 8–23)
CO2: 24 mmol/L (ref 22–32)
Calcium: 8.5 mg/dL — ABNORMAL LOW (ref 8.9–10.3)
Chloride: 105 mmol/L (ref 98–111)
Creatinine, Ser: 2.33 mg/dL — ABNORMAL HIGH (ref 0.61–1.24)
GFR, Estimated: 29 mL/min — ABNORMAL LOW (ref >60)
Glucose, Bld: 85 mg/dL (ref 70–99)
Potassium: 3.5 mmol/L (ref 3.5–5.1)
Sodium: 137 mmol/L (ref 135–145)
Total Bilirubin: 1.2 mg/dL (ref 0.3–1.2)
Total Protein: 6 g/dL — ABNORMAL LOW (ref 6.5–8.1)

## 2023-04-15 LAB — PROTIME-INR
INR: 2.5 — ABNORMAL HIGH (ref 0.8–1.2)
Prothrombin Time: 27.6 seconds — ABNORMAL HIGH (ref 11.4–15.2)

## 2023-04-15 LAB — ECHOCARDIOGRAM COMPLETE
Est EF: <20
MV M vel: 1.33 m/s
MV Peak grad: 7.1 mmHg
S' Lateral: 5.3 cm

## 2023-04-15 LAB — LACTATE DEHYDROGENASE: LDH: 172 U/L (ref 98–192)

## 2023-04-15 LAB — PREALBUMIN: Prealbumin: 23 mg/dL (ref 18–38)

## 2023-04-15 MED ORDER — WARFARIN SODIUM 2 MG PO TABS
2.0000 mg | ORAL_TABLET | Freq: Every day | ORAL | 11 refills | Status: DC
Start: 2023-04-15 — End: 2023-11-02

## 2023-04-15 MED ORDER — SILDENAFIL CITRATE 20 MG PO TABS
20.0000 mg | ORAL_TABLET | Freq: Three times a day (TID) | ORAL | 11 refills | Status: DC
Start: 2023-04-15 — End: 2024-01-11

## 2023-04-15 NOTE — Patient Instructions (Addendum)
No medication changes Coumadin dosing per Leotis Shames PharmD Return to [facility] clinic in 2 months We will schedule you for a barium swallow to assess nausea/chest tightness/difficulty eating- Tuesday 04/27/23 at 9 AM. Arrive by 8:45 AM at entrance A (main entrance) and check in at Admitting. Nothing to eat or drink after midnight Monday night.

## 2023-04-15 NOTE — Progress Notes (Signed)
LVAD INR 

## 2023-04-15 NOTE — Progress Notes (Signed)
  Echocardiogram 2D Echocardiogram has been performed.  Isaac [name] 04/15/2023, 10:40 AM

## 2023-04-15 NOTE — Progress Notes (Addendum)
Patient presents for 2 month f/u with 1.5 year Intermacs in VAD Clinic alone. Denies issues/concerns with VAD equipment or drive line.   Patient ambulated in to clinic independently without difficulty. Denies lightheadedness, dizziness, falls, shortness of breath, and signs of bleeding. States he is remaining active.   Reports intermittent chest "heaviness", "stopped up/bloated" feeling in his chest. Feeling is associated with eating at times. He is intermittently nauseated; does not take PRN Zofran. States some days he feels he is able to eat without difficulty, other days he will eat and then know "it is not going to stay down." Notices a difference in his activity level depending on if he does or does not eat. Ex: States he is able to mow the lawn without stopping if he does not eat. If he eats he has to stop to rest 4-5 times.  Discussed with Dr Shirlee Latch. Will order a barium swallow to assess esophogeal motility- scheduled 8/6 @ 0900.   He is taking Lasix 60 mg daily (prescribed 80 mg daily). Notes good urine output.   Patient is currently on Amiodarone 100 mg daily. Saw Dr Fransico Him 7/15. Has thyroid uptake study completed 7/22. Has f/u with Dr Fransico Him 7/30.   Reports groin site is completely healed. At times notices a numb feeling at previous abscess site.   ECHO completed today. RV moderately down. Significant TR noted per Dr Shirlee Latch.   ICD battery almost at [facility]. Discussed again with Dr Shirlee Latch. Will not plan to change generator at this time. EKG obtained today- reviewed with Dr Shirlee Latch.   Refills sent for Coumadin and Sildenafil sent to pt's pharmacy.   Vital Signs:  Doppler Pressure: 108 Automatic BP: 110/57 (74) HR: 69 SPO2: UTO   Weight: 192.2 lb w/ eqt Last weight: 193 lb w/eqt   VAD Indication: Destination Therapy due to age and kidney disease   LVAD assessment:  HM III: Speed: 5300 rpms Flow: 4.3 Power: 4.0 w    PI: 3.4 Alarms: none Events: rare   Fixed speed: 5300 Low speed  limit: 5000  Primary controller: back up battery due for replacement in 20 months Secondary controller:  back up battery due for replacement in 15 months   I reviewed the LVAD parameters from today and compared the results to the patient's prior recorded data. LVAD interrogation was NEGATIVE for significant power changes, NEGATIVE for clinical alarms and STABLE for PI events/speed drops. No programming changes were made and pump is functioning within specified parameters. Pt is performing daily controller and system monitor self tests along with completing weekly and monthly maintenance for LVAD equipment.   LVAD equipment check completed and is in good working order. Back-up equipment present. Charged back up battery and performed self-test on equipment.    Annual Equipment Maintenance on UBC/PM was performed on 09/2022.   Exit Site Care: Wife has been performing weekly dressing changes using weekly dressings. Drive line anchor correctly applied.  Pt given 8 weekly kits, 3 boxes of large tegaderms, and 1 box of silk tape for home use.  Device:  [employer] dual  Therapies: on Pacing: VVI 50 Last check: 04/12/23   BP & Labs:  Doppler 108 - Doppler is reflecting Modified systolic   Hgb 11.7 - No S/S of bleeding. Specifically denies melena/BRBPR or nosebleeds.   LDH stable at 172 with established baseline of 200 - 300. Denies tea-colored urine. No power elevations noted on interrogation.   1.5 year Intermacs follow up completed including:  Quality of Life,  KCCQ-12, and Neurocognitive trail making.   Pt completed 1470 feet during 6 minute walk.  Back up controller:  11V backup battery charged during this visit.  Patient Goals: To be able to continue to travel with my wife. To feel better from a GI prospective.   [place] Cardiomyopathy Questionnaire     04/15/2023    1:53 PM 04/02/2022    3:35 PM 12/09/2021    1:26 PM  KCCQ-12  1 a. Ability to shower/bathe Slightly  limited Other, Did not do Not at all limited  1 b. Ability to walk 1 block Slightly limited Not at all limited Quite a bit limited  1 c. Ability to hurry/jog Extremely limited Not at all limited Slightly limited  2. Edema feet/ankles/legs Less than once a week Never over the past 2 weeks Less than once a week  3. Limited by fatigue 1-2 times a week Less than once a week 3+ times per week, not every day  4. Limited by dyspnea 1-2 times a week Less than once a week At least once a day  5. Sitting up / on 3+ pillows Never over the past 2 weeks Never over the past 2 weeks Never over the past 2 weeks  6. Limited enjoyment of life Slightly limited Slightly limited Not limited at all  7. Rest of life w/ symptoms Mostly satisfied Completely satisfied Completely satisfied  8 a. Participation in hobbies Slightly limited Slightly limited Limited quite a bit  8 b. Participation in chores Slightly limited Did not limit at all Limited quite a bit  8 c. Visiting family/friends Slightly limited Did not limit at all Limited quite a bit     Patient Instructions:  No medication changes Coumadin dosing per Leotis Shames PharmD Return to VAD clinic in 2 months We will schedule you for a barium swallow to assess nausea/chest tightness/difficulty eating- Tuesday 04/27/23 at 9 AM. Arrive by 8:45 AM at entrance A (main entrance) and check in at Admitting. Nothing to eat or drink after midnight Monday night.   Alyce Pagan RN VAD Coordinator  Office: [phone]  24/7 Pager: [phone]       Cardiology: Dr. Shirlee Latch PCP: Jerl Mina, MD  Follow up for Heart Failure/LVAD: 72 y.o. with history of CAD, HTN, atrial fibrillation, and chronic systolic CHF returns for followup of CHF/LVAD.   Patient has had a long-standing cardiomyopathy, EF had been in the 25% range for years.  He had PCI to OM1 in 2007 and RCA in 2013.  From his report, these episodes do not sound like ACS events.  Echo in 12/22 showed EF < 20%,  severe LV dilation, restrictive diastolic function, moderate RV dysfunction, moderate MR, mod-severe TR. HF was been complicated by CKD stage 3. He was also noted on device interrogation to have been in atrial fibrillation persistently since 10/22.  He was admitted at [facility] in 12/22.  BP was low, his BP-active meds were stopped and midodrine was begun.  He was diuresed and discharged home.    He reports ongoing severe dyspnea and was admitted again in 12/22 after he stood up then passed out at home.  Device interrogation showed run of VT treated x 2 with ATP possibly around the time of syncope.  RHC in 12/22 showed low output HF and he developed cardiogenic shock. Impella 5.5 was placed.  On 09/17/21, HM3 LVAD was placed, ICD leads were plastered to the tricuspid valve with severe TR, the valve was not replaced.  The patient had  significant interoperative hypotension.  Post-operatively, he developed progressive renal failure and was started on CVVH then iHD.  He had a bumpy post-op course with renal failure and RV failure, but was ultimately able to discharge home on 10/24/21.   Ramp echo in 3/23, RV mildly dilated with normal systolic function, severe TR, LV EF 20-25%, aortic valve opens every beat.  Speed increased to 5300 rpm. The IV septum was mildly leftwards at 5300 rpm but flow increased and AoV still opened every beat.   Patient was admitted in 3/24 with left groin abscess.  This spontaneously drained and was treated with IV antibiotics.  He was sent home on linezolid and Augmentin.  He developed profuse diarrhea that he attributed to linezolid.  Linezolid was stopped and he was continued on Augmentin.   Echo was done today and reviewed, EF < 20%, IV septum midline to slightly leftward, RV moderately dilated with moderately decreased systolic function, IVC dilated with severe TR.  Speed not changed.   Patient returns for LVAD followup. Weight is down 1 lb. Patient reports occasional nausea in the  mornings associated with dysphagia at times (not always). No dyspnea walking on flat ground. He can mow the grass and do other yardwork without problems.  Following with Dr Fransico Him for thyroid abnormalities.   Denies LVAD alarms.  Denies driveline trauma, erythema or drainage.  Denies ICD shocks.   Reports taking Coumadin as prescribed and adherence to anticoagulation based dietary restrictions.  Denies bright red blood per rectum or melena, no dark urine or hematuria.    ECG (personally reviewed): Atrial flutter with IVCD  Labs (3/23): LFTs normal, TSH mildly elevated Labs (7/23): hgb 12.1 Labs (8/23): hgb 11.8 => 9.9, K 4.4, creatinine 2.5 => 3.09, LFTs normal.  Labs (11/23): K 4.5, creatinine 2.95 Labs (1/24): TSH 5.2 Labs (3/24): hgb 10.7, LDH 157, LFTs normal, creatinine 2.75 Labs (5/24): K 3.6, creatinine 2.7, LFTs normal, TSH normal Labs (7/24): hgb 11.7  PMH: 1. HTN 2. Hyperlipidemia 3. CAD: H/o PCI to OM1 2007 and PCI to RCA in 2013.  Uncertain of circumstances but do not sound like ACS events.  4. CKD stage IV: Patient was on HD for several months post-LVAD but was able to come off.  5. Atrial fibrillation: Paroxysmal 6. Chronic systolic CHF: Long-standing cardiomyopathy with EF in the 25% range.  Cause uncertain, has history of PCI to RCA and OM1, but degree of CAD does not seem to explain severity of cardiomyopathy. Father had history of CAD/CHF.  Not heavy drinker. Has [employer] ICD.  - Echo (12/22): EF < 20%, severe LV dilation, restrictive diastolic function, moderate RV dysfunction, moderate MR, mod-severe TR.  - 12/22 cardiogenic shock with Impella 5.5 placement then Heartmate 3 LVAD.  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  - Echo (7/24): EF < 20%, IV septum midline to slightly leftward, RV moderately dilated with moderately decreased systolic function, IVC dilated with severe TR. 7. VT 8. RV failure/severe TR 9. Citrobacter  UTI (8/23) 20. Left groin abscess 3/24   Current Outpatient Medications  Medication Sig Dispense Refill   amiodarone (PACERONE) 200 MG tablet Take 0.5 tablets (100 mg total) by mouth daily. 100mg  daily at bedtime (Patient taking differently: Take 100 mg by mouth daily.) 30 tablet 6   furosemide (LASIX) 40 MG tablet Take 2 tablets (80 mg total) by mouth daily. (Patient taking differently: Take 60 mg by mouth daily.) 70 tablet 3  pantoprazole (PROTONIX) 40 MG tablet TAKE 1 TABLET BY MOUTH EVERY DAY (Patient taking differently: Take 40 mg by mouth daily before breakfast.) 90 tablet 3   rosuvastatin (CRESTOR) 10 MG tablet TAKE 1 TABLET BY MOUTH EVERY DAY (Patient taking differently: Take 10 mg by mouth at bedtime.) 90 tablet 3   albuterol (VENTOLIN HFA) 108 (90 Base) MCG/ACT inhaler Inhale 2 puffs into the lungs every 6 (six) hours as needed. (Patient not taking: Reported on 02/11/2023) 1 each 3   ondansetron (ZOFRAN) 4 MG tablet Take 4 mg by mouth every 8 (eight) hours as needed for nausea or vomiting. (Patient not taking: Reported on 04/15/2023)     sildenafil (REVATIO) 20 MG tablet Take 1 tablet (20 mg total) by mouth 3 (three) times daily. 90 tablet 11   warfarin (COUMADIN) 2 MG tablet Take 1 tablet (2 mg total) by mouth daily at 4 PM. 30 tablet 11   No current facility-administered medications for this encounter.    Mushroom extract complex, Doxycycline, Neosporin [neomycin-bacitracin zn-polymyx], and Tape  REVIEW OF SYSTEMS: All systems negative except as listed in HPI, PMH and Problem list.   LVAD INTERROGATION:  See LVAD nurse's note above.   I reviewed the LVAD parameters from today, and compared the results to the patient's prior recorded data.  No programming changes were made.  The LVAD is functioning within specified parameters.  The patient performs LVAD self-test daily.  LVAD interrogation was negative for any significant power changes, alarms or PI events/speed drops.  LVAD  equipment check completed and is in good working order.  Back-up equipment present.   LVAD education done on emergency procedures and precautions and reviewed exit site care.    Vitals:   04/15/23 1054 04/15/23 1055  BP: (!) 108/0 (!) 110/57  Weight: 87.2 kg (192 lb 3.2 oz)   MAP 83  Physical Exam: General: Well appearing this am. NAD.  HEENT: Normal. Neck: Supple, JVP 9-10 cm with prominent CV waves. Carotids OK.  Cardiac:  Mechanical heart sounds with LVAD hum present.  Lungs:  CTAB, normal effort.  Abdomen:  NT, ND, no HSM. No bruits or masses. +BS  LVAD exit site: Well-healed and incorporated. Dressing dry and intact. No erythema or drainage. Stabilization device present and accurately applied. Driveline dressing changed daily per sterile technique. Extremities:  Warm and dry. No cyanosis, clubbing, rash.  1+ ankle edema.  Neuro:  Alert & oriented x 3. Cranial nerves grossly intact. Moves all 4 extremities w/o difficulty. Affect pleasant    ASSESSMENT AND PLAN: 1. Chronic systolic CHF:  Long-standing cardiomyopathy.  [employer] ICD.  Echo in 12/22 with EF < 20%, severe LV dilation, restrictive diastolic function, moderate RV dysfunction, moderate MR, mod-severe TR. Cause of cardiomyopathy is uncertain.  He has a history of CAD, but I do not think that the described CAD from the past could explain his cardiomyopathy, but CAD could have progressed.  He was admitted in 12/22 with cardiogenic shock and Impella 5.5. s/p HM III VAD on 09/17/22.  Post-op course complicated by AKI and RV failure. He was on HD for several months but is now off.  Echo today showed EF < 20%, IV septum midline to slightly leftward, RV moderately dilated with moderately decreased systolic function, IVC dilated with severe TR.  Speed not changed.  NYHA class II with mild volume overload on exam. He has severe TR.   - I am going to keep Lasix at 60 mg daily. BMET/BNP today.  -  Continue sildenafil 20 tid for RV.    - Warfarin for INR 2-2.5.   - His [employer] device is at [employer].  He has never been shocked.  We discussed generator replacement vs no replacement.  He is inclined to avoid further procedures, so I told him that I thought it would be reasonable not to replace generator.  2. Tricuspid regurgitation:  Tricuspid repair not done at time of VAD due to proximity of ICD wires and hypotension during surgery.  He has severe TR.  3. Atrial fibrillation: ECG today showed atrial flutter, rate is controlled.  - Continue amiodarone 100 mg daily, check LFTs and TSH.  He will need a regular eye exam.  If he stays in atrial flutter continuously, I think we can probably stop amiodarone in the future.  4. CKD stage IV: Suspect intra-op hypotension at the time of LVAD placement led to development of ATN => urine sediment looked like ATN per renal. Had initial CVVH and transitioned over to HD. He is now off HD with creatinine stable, most recently 2.7.  - BMET today.  - Follow with nephrology.  5. CAD: History of PCI to OM1 in 2007 and RCA in 2013.  No CP or ACs.  - Deferred cardiac cath in 12/22 due to AKI and plan for VAD - Continue Crestor.   6. Abnormal thyroid function tests: Patient has had elevated TSH and free T4, mixed pattern. He is on amiodarone.  - He follows with endocrinology.   7. Dysphagia/esophageal dysmotility: I will arrange for barium swallow.   Followup in 2 months.   Marca Ancona 04/15/2023

## 2023-04-18 LAB — POCT INR: INR: 3.1 — AB (ref 2.0–3.0)

## 2023-04-19 ENCOUNTER — Ambulatory Visit (HOSPITAL_COMMUNITY): Payer: Self-pay | Admitting: Student-PharmD

## 2023-04-20 ENCOUNTER — Encounter: Payer: Self-pay | Admitting: "Endocrinology

## 2023-04-20 ENCOUNTER — Ambulatory Visit (INDEPENDENT_AMBULATORY_CARE_PROVIDER_SITE_OTHER): Payer: Medicare HMO | Admitting: "Endocrinology

## 2023-04-20 VITALS — Ht 69.0 in | Wt 190.2 lb

## 2023-04-20 DIAGNOSIS — R7989 Other specified abnormal findings of blood chemistry: Secondary | ICD-10-CM | POA: Diagnosis not present

## 2023-04-20 NOTE — Progress Notes (Signed)
04/20/2023, 2:43 PM  Endocrinology follow-up note   Subjective:    Patient ID: Isaac [name], male    DOB: [birth year], PCP Jerl Mina, MD   Past Medical History:  Diagnosis Date   Arrhythmia    atrial fibrillation   CHF (congestive heart failure) (HCC)    Chronic kidney disease    Coronary artery disease    Hyperlipidemia    Hypertension    Myocardial infarct [facility])    Past Surgical History:  Procedure Laterality Date   CARDIAC DEFIBRILLATOR PLACEMENT  feb 2014   INSERTION OF IMPLANTABLE LEFT VENTRICULAR ASSIST DEVICE N/A 09/17/2021   Procedure: INSERTION OF IMPLANTABLE LEFT VENTRICULAR ASSIST DEVICE AND INSERTION OF FEMORAL ARTERIAL LINE;  Surgeon: Alleen Borne, MD;  Location: [facility] OR;  Service: Open Heart Surgery;  Laterality: N/A;   IR FLUORO GUIDE CV LINE RIGHT  09/27/2021   IR THORACENTESIS ASP PLEURAL SPACE W/IMG GUIDE  09/27/2021   IR US GUIDE VASC ACCESS RIGHT  09/27/2021   PLACEMENT OF IMPELLA LEFT VENTRICULAR ASSIST DEVICE N/A 09/12/2021   Procedure: PLACEMENT OF IMPELLA 5.5 LEFT VENTRICULAR ASSIST DEVICE;  Surgeon: Loreli Slot, MD;  Location: [facility] OR;  Service: Open Heart Surgery;  Laterality: N/A;   RIGHT HEART CATH N/A 09/08/2021   Procedure: RIGHT HEART CATH;  Surgeon: Laurey Morale, MD;  Location: [facility] INVASIVE CV LAB;  Service: Cardiovascular;  Laterality: N/A;   TEE WITHOUT CARDIOVERSION N/A 09/12/2021   Procedure: TRANSESOPHAGEAL ECHOCARDIOGRAM (TEE);  Surgeon: Loreli Slot, MD;  Location: [facility] OR;  Service: Open Heart Surgery;  Laterality: N/A;   TEE WITHOUT CARDIOVERSION N/A 09/17/2021   Procedure: TRANSESOPHAGEAL ECHOCARDIOGRAM (TEE);  Surgeon: Alleen Borne, MD;  Location: [facility] OR;  Service: Open Heart Surgery;  Laterality: N/A;   Social History   Socioeconomic History   Marital status: Married    Spouse name: Not on file   Number of children: Not on file   Years of education:  Not on file   Highest education level: Not on file  Occupational History   Not on file  Tobacco Use   Smoking status: Former   Smokeless tobacco: Never  Substance and Sexual Activity   Alcohol use: No   Drug use: No   Sexual activity: Not on file  Other Topics Concern   Not on file  Social History Narrative   Not on file   Social Determinants of Health   Financial Resource Strain: Not on file  Food Insecurity: Not on file  Transportation Needs: Not on file  Physical Activity: Not on file  Stress: Not on file  Social Connections: Not on file   Family History  Problem Relation Age of Onset   Hypertension Mother    Hypertension Father    Hyperlipidemia Father    Heart attack Father    Heart failure Father    Outpatient Encounter Medications as of 04/20/2023  Medication Sig   albuterol (VENTOLIN HFA) 108 (90 Base) MCG/ACT inhaler Inhale 2 puffs into the lungs every 6 (six) hours as needed. (Patient not taking: Reported on 02/11/2023)   amiodarone (PACERONE) 200 MG tablet Take 0.5 tablets (100 mg total) by mouth daily. 100mg  daily at bedtime (Patient taking  differently: Take 100 mg by mouth daily.)   furosemide (LASIX) 40 MG tablet Take 2 tablets (80 mg total) by mouth daily. (Patient taking differently: Take 60 mg by mouth daily.)   ondansetron (ZOFRAN) 4 MG tablet Take 4 mg by mouth every 8 (eight) hours as needed for nausea or vomiting. (Patient not taking: Reported on 04/15/2023)   pantoprazole (PROTONIX) 40 MG tablet TAKE 1 TABLET BY MOUTH EVERY DAY (Patient taking differently: Take 40 mg by mouth daily before breakfast.)   rosuvastatin (CRESTOR) 10 MG tablet TAKE 1 TABLET BY MOUTH EVERY DAY (Patient taking differently: Take 10 mg by mouth at bedtime.)   sildenafil (REVATIO) 20 MG tablet Take 1 tablet (20 mg total) by mouth 3 (three) times daily.   warfarin (COUMADIN) 2 MG tablet Take 1 tablet (2 mg total) by mouth daily at 4 PM.   No facility-administered encounter  medications on file as of 04/20/2023.   ALLERGIES: Allergies  Allergen Reactions   Mushroom Extract Complex Nausea And Vomiting   Doxycycline Nausea And Vomiting   Neosporin [Neomycin-Bacitracin Zn-Polymyx] Hives   Tape Other (See Comments)    Some tapes/dressings can irritate the skin    VACCINATION STATUS:  There is no immunization history on file for this patient.  HPI Isaac [name] is 72 y.o. male who presents today with a medical history as above. he is being seen in follow-up after he was seen consultation for abnormal thyroid function tests requested by Jerl Mina, MD.  He is accompanied by his wife to clinic.  Patient with multiple medical problems including coronary artery disease, CVA, CKD, CHF, tachyarrhythmia.  She was recently found to have fluctuating thyroid function tests including times of high TSH and most recently elevated free T4.  Of note, patient has been on amiodarone for the last year, currently 100 mg p.o. daily. He currently denies palpitations, heat intolerance.  He is gaining weight, recently gaining 10 pounds. He was sent for thyroid uptake and scan which revealed significantly diminished uptake-see below. He has CHF with recent ejection fraction of 25%, in December 2022 underwent HM 3 LVAD placement. He does not exert much at baseline. He denies dysphagia, shortness of breath, nor voice change.  He denies family history of thyroid dysfunction or thyroid malignancy. He was not put on any thyroid hormone nor antithyroid medications so far.  He is a former smoker.  He is also known to have hyperlipidemia and hypertension.    Review of Systems  Constitutional: + Recent weight gain,  +fatigue, no subjective hyperthermia, no subjective hypothermia Eyes: no blurry vision, no xerophthalmia ENT: no sore throat, no nodules palpated in throat, no dysphagia/odynophagia, no hoarseness   Objective:       04/20/2023   12:57 PM 04/15/2023   10:55 AM  04/15/2023   10:54 AM  Vitals with BMI  Height 5\' 9"     Weight 190 lbs 3 oz  192 lbs 3 oz  BMI 28.07    Systolic  110 108  Diastolic  57 0    Ht 5\' 9"  (1.753 m)   Wt 190 lb 3.2 oz (86.3 kg)   BMI 28.09 kg/m   Wt Readings from Last 3 Encounters:  04/20/23 190 lb 3.2 oz (86.3 kg)  04/15/23 192 lb 3.2 oz (87.2 kg)  04/05/23 189 lb 9.6 oz (86 kg)    Physical Exam  Constitutional:  Body mass index is 28.09 kg/m.,  not in acute distress, normal state of mind Eyes: PERRLA, EOMI,  no exophthalmos ENT: moist mucous membranes, no gross thyromegaly, no gross cervical lymphadenopathy   CMP ( most recent) CMP     Component Value Date/Time   NA 137 04/15/2023 1044   NA 141 07/06/2014 1425   K 3.5 04/15/2023 1044   K 3.6 07/06/2014 1425   CL 105 04/15/2023 1044   CL 110 (H) 07/06/2014 1425   CO2 24 04/15/2023 1044   CO2 30 07/06/2014 1425   GLUCOSE 85 04/15/2023 1044   GLUCOSE 93 07/06/2014 1425   BUN 23 04/15/2023 1044   BUN 15 07/06/2014 1425   CREATININE 2.33 (H) 04/15/2023 1044   CREATININE 1.45 (H) 07/06/2014 1425   CALCIUM 8.5 (L) 04/15/2023 1044   CALCIUM 8.5 07/06/2014 1425   PROT 6.0 (L) 04/15/2023 1044   ALBUMIN 2.8 (L) 04/15/2023 1044   AST 21 04/15/2023 1044   ALT 20 04/15/2023 1044   ALKPHOS 125 04/15/2023 1044   BILITOT 1.2 04/15/2023 1044   GFRNONAA 29 (L) 04/15/2023 1044   GFRNONAA 52 (L) 07/06/2014 1425     Diabetic Labs (most recent): Lab Results  Component Value Date   HGBA1C 6.1 (H) 08/23/2021     Lipid Panel ( most recent) Lipid Panel     Component Value Date/Time   CHOL 94 09/08/2021 0141   TRIG 54 09/08/2021 0141   HDL 25 (L) 09/08/2021 0141   CHOLHDL 3.8 09/08/2021 0141   VLDL 11 09/08/2021 0141   LDLCALC 58 09/08/2021 0141      Lab Results  Component Value Date   TSH 4.095 02/11/2023   TSH 4.992 (H) 12/09/2022   TSH 5.244 (H) 10/07/2022   TSH 4.695 (H) 04/07/2022   TSH 4.798 (H) 01/20/2022   FREET4 1.77 (H) 02/11/2023    FREET4 1.48 (H) 12/09/2022   FREET4 1.54 (H) 10/07/2022   FREET4 1.49 (H) 01/20/2022   FREET4 1.56 (H) 09/13/2021         Thyroid uptake and scan on April 13, 2023 FINDINGS: Planar imaging of the thyroid was obtained in multiple projections. There is limited visualization of the thyroid gland due to minimal uptake of radiotracer by the thyroid parenchyma.   4 hour I-123 uptake = 1.0% (normal 5-20%)   24 hour I-123 uptake = 1.5% (normal 10-30%)   IMPRESSION: 1. Markedly diminished iodine uptake values at 4 hours and 24 hours. Evaluation of the thyroid gland is limited due to decreased parenchymal uptake of radiotracer.   Assessment & Plan:   1. Abnormal thyroid blood test    - Trynt Pickus  is being seen at a kind request of Jerl Mina, MD. - I have reviewed his  new and available thyroid records and clinically evaluated the patient. 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  He will not need any specific antithyroid intervention at this time.  He does not have neck pain, will not benefit from steroids.    The natural course of subacute thyroiditis was discussed with her including the possibility of future thyroid dysfunction.  She will have repeat thyroid function test in 8 weeks with office visit.    - he is advised to maintain close follow up with Jerl Mina, MD for primary care needs.   I spent  21  minutes in the care of the patient today including review of labs from Thyroid Function, CMP, and other relevant labs ; imaging/biopsy records (current and previous including abstractions from other facilities); face-to-face time discussing  his lab results and symptoms, medications doses, his options of short and long term treatment based on the latest standards of care / guidelines;   and documenting the encounter.  Isaac [name]  participated in the discussions, expressed understanding, and  voiced agreement with the above plans.  All questions were answered to his satisfaction. he is encouraged to contact clinic should he have any questions or concerns prior to his return visit.  Follow up plan: Return in about 9 weeks (around 06/22/2023) for F/U with Pre-visit Labs.   Marquis Lunch, MD [facility] [address] [place], [place] [address] Phone: [phone]  Fax: [phone]     04/20/2023, 2:43 PM  This note was partially dictated with voice recognition software. Similar sounding words can be transcribed inadequately or may not  be corrected upon review.

## 2023-04-26 ENCOUNTER — Ambulatory Visit (HOSPITAL_COMMUNITY): Payer: Self-pay | Admitting: Pharmacist

## 2023-04-26 LAB — POCT INR: INR: 2.3 (ref 2.0–3.0)

## 2023-04-27 ENCOUNTER — Telehealth: Payer: Self-pay

## 2023-04-27 ENCOUNTER — Ambulatory Visit (HOSPITAL_COMMUNITY)
Admission: RE | Admit: 2023-04-27 | Discharge: 2023-04-27 | Disposition: A | Discharge status: Home / Self Care | Payer: Medicare HMO | Source: Ambulatory Visit | Attending: Cardiology | Admitting: Cardiology

## 2023-04-27 DIAGNOSIS — Z95811 Presence of heart assist device: Secondary | ICD-10-CM | POA: Diagnosis present

## 2023-04-27 DIAGNOSIS — R110 Nausea: Secondary | ICD-10-CM

## 2023-04-27 DIAGNOSIS — R1310 Dysphagia, unspecified: Secondary | ICD-10-CM | POA: Diagnosis present

## 2023-04-27 DIAGNOSIS — R140 Abdominal distension (gaseous): Secondary | ICD-10-CM

## 2023-04-27 NOTE — Telephone Encounter (Signed)
-----   Message from Nurse Mindi Junker R sent at 04/27/2023  5:05 PM EDT ----- Regarding: ICD Please see this from [facility] note on 04/15/2023.  ICD battery almost at [facility]. Discussed again with Dr Shirlee Latch. Will not plan to change generator at this time. EKG obtained today- reviewed with Dr Shirlee Latch.   His [employer] device is at [employer].  He has never been shocked.  We discussed generator replacement vs no replacement.  He is inclined to avoid further procedures, so I told him that I thought it would be reasonable not to replace generator.   Alyce Pagan RN VAD Coordinator  Office: [phone]  24/7 Pager: [phone]

## 2023-04-30 LAB — POCT INR: INR: 1.6 — AB (ref 2.0–3.0)

## 2023-04-30 NOTE — Progress Notes (Signed)
Remote ICD transmission.   

## 2023-05-03 ENCOUNTER — Ambulatory Visit (HOSPITAL_COMMUNITY): Payer: Self-pay | Admitting: Pharmacist

## 2023-05-03 LAB — POCT INR: INR: 1.6 — AB (ref 2.0–3.0)

## 2023-05-04 ENCOUNTER — Ambulatory Visit (HOSPITAL_COMMUNITY): Payer: Self-pay | Admitting: Pharmacist

## 2023-05-04 LAB — POCT INR: INR: 1.9 — AB (ref 2.0–3.0)

## 2023-05-10 ENCOUNTER — Ambulatory Visit (HOSPITAL_COMMUNITY): Payer: Self-pay | Admitting: Pharmacist

## 2023-05-10 LAB — POCT INR: INR: 2.5 (ref 2.0–3.0)

## 2023-05-11 ENCOUNTER — Encounter: Payer: Medicare HMO | Admitting: Internal Medicine

## 2023-05-13 ENCOUNTER — Ambulatory Visit (INDEPENDENT_AMBULATORY_CARE_PROVIDER_SITE_OTHER): Payer: Medicare HMO

## 2023-05-13 DIAGNOSIS — I428 Other cardiomyopathies: Secondary | ICD-10-CM

## 2023-05-13 LAB — CUP PACEART REMOTE DEVICE CHECK
Battery Remaining Longevity: 3 mo — CL
Battery Remaining Percentage: 2 %
Brady Statistic RA Percent Paced: 0 %
Brady Statistic RV Percent Paced: 8 %
Date Time Interrogation Session: 20240822031000
HighPow Impedance: 50 Ohm
Implantable Lead Connection Status: 753985
Implantable Lead Connection Status: 753985
Implantable Lead Implant Date: 20140205
Implantable Lead Implant Date: 20140205
Implantable Lead Location: 753859
Implantable Lead Location: 753860
Implantable Lead Model: 296
Implantable Lead Model: 4470
Implantable Lead Serial Number: 727876
Implantable Pulse Generator Implant Date: 20140205
Lead Channel Impedance Value: 346 Ohm
Lead Channel Impedance Value: 405 Ohm
Lead Channel Pacing Threshold Amplitude: 1.5 V
Lead Channel Pacing Threshold Amplitude: 3.4 V
Lead Channel Pacing Threshold Pulse Width: 0.5 ms
Lead Channel Pacing Threshold Pulse Width: 2 ms
Lead Channel Setting Pacing Amplitude: 3.5 V
Lead Channel Setting Pacing Pulse Width: 0.5 ms
Lead Channel Setting Sensing Sensitivity: 0.6 mV
Pulse Gen Serial Number: 113490
Zone Setting Status: 755011

## 2023-05-14 ENCOUNTER — Ambulatory Visit (HOSPITAL_COMMUNITY): Payer: Self-pay | Admitting: Pharmacist

## 2023-05-14 LAB — POCT INR: INR: 3.2 — AB (ref 2.0–3.0)

## 2023-05-17 ENCOUNTER — Ambulatory Visit (HOSPITAL_COMMUNITY): Payer: Self-pay | Admitting: Pharmacist

## 2023-05-17 LAB — POCT INR: INR: 2.5 (ref 2.0–3.0)

## 2023-05-21 ENCOUNTER — Ambulatory Visit (HOSPITAL_COMMUNITY): Payer: Self-pay | Admitting: Pharmacist

## 2023-05-21 LAB — POCT INR: INR: 2 (ref 2.0–3.0)

## 2023-05-25 NOTE — Progress Notes (Signed)
Remote ICD transmission.   

## 2023-05-27 ENCOUNTER — Ambulatory Visit (HOSPITAL_COMMUNITY): Payer: Self-pay | Admitting: Pharmacist

## 2023-05-27 LAB — POCT INR: INR: 3 (ref 2.0–3.0)

## 2023-06-07 ENCOUNTER — Ambulatory Visit (HOSPITAL_COMMUNITY): Payer: Self-pay | Admitting: Pharmacist

## 2023-06-07 LAB — POCT INR: INR: 2.8 (ref 2.0–3.0)

## 2023-06-10 ENCOUNTER — Telehealth: Payer: Self-pay

## 2023-06-10 NOTE — Telephone Encounter (Signed)
ICD reached ERI 06/09/2023. Patient has upcoming apt with Dr. Graciela Husbands 06/29/2023. Note added in apt notes to discuss gen change. Routing to Dr. Odessa Fleming covering RN and April for [facility].

## 2023-06-11 ENCOUNTER — Telehealth (HOSPITAL_COMMUNITY): Payer: Self-pay | Admitting: Unknown Physician Specialty

## 2023-06-11 ENCOUNTER — Ambulatory Visit (HOSPITAL_COMMUNITY): Payer: Self-pay | Admitting: Pharmacist

## 2023-06-11 LAB — POCT INR: INR: 2.1 (ref 2.0–3.0)

## 2023-06-11 MED ORDER — METOCLOPRAMIDE HCL 5 MG PO TABS: 5.0000 mg | ORAL_TABLET | Freq: Four times a day (QID) | ORAL | 6 refills | Status: DC

## 2023-06-11 NOTE — Telephone Encounter (Signed)
Pt states he is still having issues with nausea and vomiting. All GI tests have been negative. We are going to add Reglan 5 mg 4x times a day to see if this will help his symptoms. Pt also instructed to STOP Amiodarone per Dr Shirlee Latch. Pt ask if he could wait to start the Reglan to see if stopping the amio resolves his symptoms. He was informed that this is totally fine. If stopping the amiodarone resolves his symptoms then he does not need to start Reglan. However, we have sent the reglan to his pharmacy incase.   Carlton Adam RN, BSN VAD Coordinator 24/7 Pager [phone]

## 2023-06-14 ENCOUNTER — Ambulatory Visit (INDEPENDENT_AMBULATORY_CARE_PROVIDER_SITE_OTHER): Payer: Medicare HMO

## 2023-06-14 DIAGNOSIS — I5022 Chronic systolic (congestive) heart failure: Secondary | ICD-10-CM

## 2023-06-14 DIAGNOSIS — I428 Other cardiomyopathies: Secondary | ICD-10-CM | POA: Diagnosis not present

## 2023-06-15 LAB — CUP PACEART REMOTE DEVICE CHECK
Brady Statistic RA Percent Paced: 0 %
Brady Statistic RV Percent Paced: 8 %
Date Time Interrogation Session: 20240923074100
HighPow Impedance: 51 Ohm
Implantable Lead Connection Status: 753985
Implantable Lead Connection Status: 753985
Implantable Lead Implant Date: 20140205
Implantable Lead Implant Date: 20140205
Implantable Lead Location: 753859
Implantable Lead Location: 753860
Implantable Lead Model: 296
Implantable Lead Model: 4470
Implantable Lead Serial Number: 727876
Implantable Pulse Generator Implant Date: 20140205
Lead Channel Impedance Value: 344 Ohm
Lead Channel Impedance Value: 415 Ohm
Lead Channel Pacing Threshold Amplitude: 1.5 V
Lead Channel Pacing Threshold Amplitude: 3.4 V
Lead Channel Pacing Threshold Pulse Width: 0.5 ms
Lead Channel Pacing Threshold Pulse Width: 2 ms
Lead Channel Setting Pacing Amplitude: 3.5 V
Lead Channel Setting Pacing Pulse Width: 0.5 ms
Lead Channel Setting Sensing Sensitivity: 0.6 mV
Pulse Gen Serial Number: 113490
Zone Setting Status: 755011

## 2023-06-16 ENCOUNTER — Other Ambulatory Visit (HOSPITAL_COMMUNITY): Payer: Self-pay

## 2023-06-16 DIAGNOSIS — Z95811 Presence of heart assist device: Secondary | ICD-10-CM

## 2023-06-16 DIAGNOSIS — Z7901 Long term (current) use of anticoagulants: Secondary | ICD-10-CM

## 2023-06-17 ENCOUNTER — Encounter (HOSPITAL_COMMUNITY): Payer: Medicare HMO | Admitting: Cardiology

## 2023-06-17 ENCOUNTER — Ambulatory Visit (HOSPITAL_COMMUNITY)
Admission: RE | Admit: 2023-06-17 | Discharge: 2023-06-17 | Disposition: A | Discharge status: Home / Self Care | Payer: Medicare HMO | Source: Ambulatory Visit | Attending: Cardiology | Admitting: Cardiology

## 2023-06-17 ENCOUNTER — Ambulatory Visit (HOSPITAL_COMMUNITY): Payer: Self-pay | Admitting: Pharmacist

## 2023-06-17 ENCOUNTER — Encounter (HOSPITAL_COMMUNITY): Payer: Self-pay | Admitting: Cardiology

## 2023-06-17 VITALS — BP 80/0 | HR 69 | Wt 191.4 lb

## 2023-06-17 DIAGNOSIS — R946 Abnormal results of thyroid function studies: Secondary | ICD-10-CM | POA: Diagnosis not present

## 2023-06-17 DIAGNOSIS — I429 Cardiomyopathy, unspecified: Secondary | ICD-10-CM | POA: Insufficient documentation

## 2023-06-17 DIAGNOSIS — Z7901 Long term (current) use of anticoagulants: Secondary | ICD-10-CM | POA: Insufficient documentation

## 2023-06-17 DIAGNOSIS — I2510 Atherosclerotic heart disease of native coronary artery without angina pectoris: Secondary | ICD-10-CM | POA: Diagnosis not present

## 2023-06-17 DIAGNOSIS — N184 Chronic kidney disease, stage 4 (severe): Secondary | ICD-10-CM | POA: Diagnosis not present

## 2023-06-17 DIAGNOSIS — I130 Hypertensive heart and chronic kidney disease with heart failure and stage 1 through stage 4 chronic kidney disease, or unspecified chronic kidney disease: Secondary | ICD-10-CM | POA: Insufficient documentation

## 2023-06-17 DIAGNOSIS — Z95811 Presence of heart assist device: Secondary | ICD-10-CM | POA: Diagnosis not present

## 2023-06-17 DIAGNOSIS — I081 Rheumatic disorders of both mitral and tricuspid valves: Secondary | ICD-10-CM | POA: Insufficient documentation

## 2023-06-17 DIAGNOSIS — Z955 Presence of coronary angioplasty implant and graft: Secondary | ICD-10-CM | POA: Diagnosis not present

## 2023-06-17 DIAGNOSIS — Z79899 Other long term (current) drug therapy: Secondary | ICD-10-CM | POA: Insufficient documentation

## 2023-06-17 DIAGNOSIS — I482 Chronic atrial fibrillation, unspecified: Secondary | ICD-10-CM | POA: Insufficient documentation

## 2023-06-17 DIAGNOSIS — I5022 Chronic systolic (congestive) heart failure: Secondary | ICD-10-CM | POA: Diagnosis not present

## 2023-06-17 DIAGNOSIS — I472 Ventricular tachycardia, unspecified: Secondary | ICD-10-CM | POA: Diagnosis present

## 2023-06-17 LAB — CBC
HCT: 34.7 % — ABNORMAL LOW (ref 39.0–52.0)
Hemoglobin: 11 g/dL — ABNORMAL LOW (ref 13.0–17.0)
MCH: 28.6 pg (ref 26.0–34.0)
MCHC: 31.7 g/dL (ref 30.0–36.0)
MCV: 90.4 fL (ref 80.0–100.0)
Platelets: 258 10*3/uL (ref 150–400)
RBC: 3.84 MIL/uL — ABNORMAL LOW (ref 4.22–5.81)
RDW: 16.8 % — ABNORMAL HIGH (ref 11.5–15.5)
WBC: 3.8 10*3/uL — ABNORMAL LOW (ref 4.0–10.5)
nRBC: 0 % (ref 0.0–0.2)

## 2023-06-17 LAB — PROTIME-INR
INR: 2.3 — ABNORMAL HIGH (ref 0.8–1.2)
Prothrombin Time: 25.6 seconds — ABNORMAL HIGH (ref 11.4–15.2)

## 2023-06-17 LAB — BASIC METABOLIC PANEL
Anion gap: 7 (ref 5–15)
BUN: 20 mg/dL (ref 8–23)
CO2: 23 mmol/L (ref 22–32)
Calcium: 8.3 mg/dL — ABNORMAL LOW (ref 8.9–10.3)
Chloride: 108 mmol/L (ref 98–111)
Creatinine, Ser: 2.34 mg/dL — ABNORMAL HIGH (ref 0.61–1.24)
GFR, Estimated: 29 mL/min — ABNORMAL LOW (ref >60)
Glucose, Bld: 90 mg/dL (ref 70–99)
Potassium: 3 mmol/L — ABNORMAL LOW (ref 3.5–5.1)
Sodium: 138 mmol/L (ref 135–145)

## 2023-06-17 LAB — LACTATE DEHYDROGENASE: LDH: 167 U/L (ref 98–192)

## 2023-06-17 MED ORDER — POTASSIUM CHLORIDE CRYS ER 20 MEQ PO TBCR: 20.0000 meq | EXTENDED_RELEASE_TABLET | Freq: Once | ORAL | 6 refills | Status: DC

## 2023-06-17 MED ORDER — FUROSEMIDE 80 MG PO TABS
80.0000 mg | ORAL_TABLET | Freq: Every day | ORAL | 3 refills | Status: DC
Start: 2023-06-17 — End: 2023-08-31

## 2023-06-17 NOTE — Progress Notes (Addendum)
Patient presents for 2 month f/u in VAD Clinic with his wife Nicole Cella. Denies issues/concerns with VAD equipment or drive line.    Patient ambulated in to clinic independently without difficulty. Denies lightheadedness, dizziness, falls, shortness of breath, and signs of bleeding. States he is remaining active.   Pt stopped per Dr. Shirlee Latch Amiodarone 9/20 after having ongoing issues with nausea and vomiting. Pt states symptoms have improved and has PRN Zofran and Reglan that he has not taken.  He is taking Lasix 60 mg daily (prescribed 80 mg daily). Notes good urine output. Wife states his ankles are more swollen than usual. Discussed with Dr. Shirlee Latch will increase Lasix to 80mg  daily along with of Potassium daily. Pt requesting 80mg  tablet. RX sent to [facility] on [address] in [place].   Saw Dr Fransico Him 7/15. Has thyroid uptake study completed 7/22. Has f/u with Dr Fransico Him 10/1.  ICD battery at [facility]. Discussed again with Dr Shirlee Latch. Will not plan to change generator at this time. Follow up with Dr. Graciela Husbands scheduled 10/8.12 lead EKG obtained today- AFIB 69 BPM.   Vital Signs:  Doppler Pressure: 80 Automatic BP: 86/74 (80) HR: 78 SPO2: 97   Weight: 192.2 lb w/ eqt Last weight: 193 lb w/eqt   VAD Indication: Destination Therapy due to age and kidney disease   LVAD assessment:  HM III: Speed: 5300 rpms Flow: 4.5 Power: 3.2 w    PI: 3.9 Alarms: none Events: rare   Fixed speed: 5300 Low speed limit: 5000  Primary controller: back up battery due for replacement in 18 months Secondary controller:  back up battery due for replacement in 13 months   I reviewed the LVAD parameters from today and compared the results to the patient's prior recorded data. LVAD interrogation was NEGATIVE for significant power changes, NEGATIVE for clinical alarms and STABLE for PI events/speed drops. No programming changes were made and pump is functioning within specified parameters. Pt is performing daily  controller and system monitor self tests along with completing weekly and monthly maintenance for LVAD equipment.   LVAD equipment check completed and is in good working order. Back-up equipment present. Charged back up battery and performed self-test on equipment.    Annual Equipment Maintenance on UBC/PM was performed on 09/2022.   Exit Site Care: Wife has been performing weekly dressing changes using weekly dressings. Drive line anchor correctly applied.  Pt given 8 weekly kits, 3 boxes of large tegaderms, and 8 anchors.  Device:  [employer] dual  Therapies: on Pacing: VVI 50 Last check: 06/14/23   BP & Labs:  Doppler 80 - Doppler is reflecting Modified systolic   Hgb 11.0 - No S/S of bleeding. Specifically denies melena/BRBPR or nosebleeds.   LDH stable at 167 with established baseline of 200 - 300. Denies tea-colored urine. No power elevations noted on interrogation.   Patient Instructions:  Increase Lasix to 80mg  daily Start Potassium daily BMET 2 weeks Follow up in VAD Clinic in 2 months with Dr. Basilia Jumbo RN,BSN VAD Coordinator  Office: [phone]  24/7 Pager: [phone]       Cardiology: Dr. Shirlee Latch PCP: Jerl Mina, MD  Follow up for Heart Failure/LVAD: 72 y.o. with history of CAD, HTN, atrial fibrillation, and chronic systolic CHF returns for followup of CHF/LVAD.   Patient has had a long-standing cardiomyopathy, EF had been in the 25% range for years.  He had PCI to OM1 in 2007 and RCA in 2013.  From his report,  these episodes do not sound like ACS events.  Echo in 12/22 showed EF < 20%, severe LV dilation, restrictive diastolic function, moderate RV dysfunction, moderate MR, mod-severe TR. HF was been complicated by CKD stage 3. He was also noted on device interrogation to have been in atrial fibrillation persistently since 10/22.  He was admitted at [facility] in 12/22.  BP was low, his BP-active meds were stopped and midodrine was begun.  He  was diuresed and discharged home.    He reports ongoing severe dyspnea and was admitted again in 12/22 after he stood up then passed out at home.  Device interrogation showed run of VT treated x 2 with ATP possibly around the time of syncope.  RHC in 12/22 showed low output HF and he developed cardiogenic shock. Impella 5.5 was placed.  On 09/17/21, HM3 LVAD was placed, ICD leads were plastered to the tricuspid valve with severe TR, the valve was not replaced.  The patient had significant interoperative hypotension.  Post-operatively, he developed progressive renal failure and was started on CVVH then iHD.  He had a bumpy post-op course with renal failure and RV failure, but was ultimately able to discharge home on 10/24/21.   Ramp echo in 3/23, RV mildly dilated with normal systolic function, severe TR, LV EF 20-25%, aortic valve opens every beat.  Speed increased to 5300 rpm. The IV septum was mildly leftwards at 5300 rpm but flow increased and AoV still opened every beat.   Patient was admitted in 3/24 with left groin abscess.  This spontaneously drained and was treated with IV antibiotics.  He was sent home on linezolid and Augmentin.  He developed profuse diarrhea that he attributed to linezolid.  Linezolid was stopped and he was continued on Augmentin.   Echo in 7/24 showed EF < 20%, IV septum midline to slightly leftward, RV moderately dilated with moderately decreased systolic function, IVC dilated with severe TR.  Speed not changed.   Patient returns for LVAD followup.  He had been having trouble with nausea, especially in the morning.  Barium swallow was unremarkable. I had him stop amiodarone, and this has seemed to resolve his symptoms.  He is now feeling generally good.  No significant dyspnea with usual activities.  MAP stable.  Weight down 1 lb.  No orthopnea/PND.  No lightheadedness.   Denies LVAD alarms.  Denies driveline trauma, erythema or drainage.  Denies ICD shocks.   Reports taking  Coumadin as prescribed and adherence to anticoagulation based dietary restrictions.  Denies bright red blood per rectum or melena, no dark urine or hematuria.    ECG (personally reviewed): significant baseline artifact but probably atrial fibrillation/flutter  Labs (3/23): LFTs normal, TSH mildly elevated Labs (7/23): hgb 12.1 Labs (8/23): hgb 11.8 => 9.9, K 4.4, creatinine 2.5 => 3.09, LFTs normal.  Labs (11/23): K 4.5, creatinine 2.95 Labs (1/24): TSH 5.2 Labs (3/24): hgb 10.7, LDH 157, LFTs normal, creatinine 2.75 Labs (5/24): K 3.6, creatinine 2.7, LFTs normal, TSH normal Labs (7/24): hgb 11.7, LDH 172, K 3.5, creatinine 2.33  PMH: 1. HTN 2. Hyperlipidemia 3. CAD: H/o PCI to OM1 2007 and PCI to RCA in 2013.  Uncertain of circumstances but do not sound like ACS events.  4. CKD stage IV: Patient was on HD for several months post-LVAD but was able to come off.  5. Atrial fibrillation: Paroxysmal 6. Chronic systolic CHF: Long-standing cardiomyopathy with EF in the 25% range.  Cause uncertain, has history of PCI to RCA  and OM1, but degree of CAD does not seem to explain severity of cardiomyopathy. Father had history of CAD/CHF.  Not heavy drinker. Has [employer] ICD.  - Echo (12/22): EF < 20%, severe LV dilation, restrictive diastolic function, moderate RV dysfunction, moderate MR, mod-severe TR.  - 12/22 cardiogenic shock with Impella 5.5 placement then Heartmate 3 LVAD.  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  - Echo (7/24): EF < 20%, IV septum midline to slightly leftward, RV moderately dilated with moderately decreased systolic function, IVC dilated with severe TR. 7. VT - Nausea with amiodarone.  8. RV failure/severe TR 9. Citrobacter UTI (8/23) 20. Left groin abscess 3/24   Current Outpatient Medications  Medication Sig Dispense Refill   albuterol (VENTOLIN HFA) 108 (90 Base) MCG/ACT inhaler Inhale 2 puffs into the lungs every 6 (six)  hours as needed. 1 each 3   furosemide (LASIX) 80 MG tablet Take 1 tablet (80 mg total) by mouth daily. 90 tablet 3   pantoprazole (PROTONIX) 40 MG tablet TAKE 1 TABLET BY MOUTH EVERY DAY (Patient taking differently: Take 40 mg by mouth daily before breakfast.) 90 tablet 3   potassium chloride SA (KLOR-CON M) 20 MEQ tablet Take 1 tablet (20 mEq total) by mouth once for 1 dose. 30 tablet 6   rosuvastatin (CRESTOR) 10 MG tablet TAKE 1 TABLET BY MOUTH EVERY DAY (Patient taking differently: Take 10 mg by mouth at bedtime.) 90 tablet 3   sildenafil (REVATIO) 20 MG tablet Take 1 tablet (20 mg total) by mouth 3 (three) times daily. 90 tablet 11   warfarin (COUMADIN) 2 MG tablet Take 1 tablet (2 mg total) by mouth daily at 4 PM. 30 tablet 11   metoCLOPramide (REGLAN) 5 MG tablet Take 1 tablet (5 mg total) by mouth 4 (four) times daily. (Patient not taking: Reported on 06/17/2023) 120 tablet 6   ondansetron (ZOFRAN) 4 MG tablet Take 4 mg by mouth every 8 (eight) hours as needed for nausea or vomiting. (Patient not taking: Reported on 04/15/2023)     No current facility-administered medications for this encounter.    Mushroom extract complex, Doxycycline, Neosporin [neomycin-bacitracin zn-polymyx], and Tape  REVIEW OF SYSTEMS: All systems negative except as listed in HPI, PMH and Problem list.   LVAD INTERROGATION:  See LVAD nurse's note above.   I reviewed the LVAD parameters from today, and compared the results to the patient's prior recorded data.  No programming changes were made.  The LVAD is functioning within specified parameters.  The patient performs LVAD self-test daily.  LVAD interrogation was negative for any significant power changes, alarms or PI events/speed drops.  LVAD equipment check completed and is in good working order.  Back-up equipment present.   LVAD education done on emergency procedures and precautions and reviewed exit site care.    Vitals:   06/17/23 1519 06/17/23 1520  BP:  (!) 86/74 (!) 80/0  Pulse: 69   SpO2: 97%   Weight: 86.8 kg (191 lb 6.4 oz)   MAP 83  Physical Exam: General: NAD Neck: JVP 10 cm, no thyromegaly or thyroid nodule.  Lungs: Clear to auscultation bilaterally with normal respiratory effort. CV: Nondisplaced PMI.  Heart regular S1/S2, no S3/S4, no murmur.  1+ ankle edema.  No carotid bruit.  Normal pedal pulses.  Abdomen: Soft, nontender, no hepatosplenomegaly, no distention.  Skin: Intact without lesions or rashes.  Neurologic: Alert and oriented x 3.  Psych: Normal affect. Extremities:  No clubbing or cyanosis.  HEENT: Normal.   ASSESSMENT AND PLAN: 1. Chronic systolic CHF:  Long-standing cardiomyopathy.  [employer] ICD.  Echo in 12/22 with EF < 20%, severe LV dilation, restrictive diastolic function, moderate RV dysfunction, moderate MR, mod-severe TR. Cause of cardiomyopathy is uncertain.  He has a history of CAD, but I do not think that the described CAD from the past could explain his cardiomyopathy, but CAD could have progressed.  He was admitted in 12/22 with cardiogenic shock and Impella 5.5. s/p HM III VAD on 09/17/22.  Post-op course complicated by AKI and RV failure. He was on HD for several months but is now off.  Echo in 7/24 showed EF < 20%, IV septum midline to slightly leftward, RV moderately dilated with moderately decreased systolic function, IVC dilated with severe TR.  Speed not changed.  Today, NYHA class II symptoms, appears mildly volume overloaded.   - Increase Lasix to 80 mg daily and add KCl 20 daily, BMET today and again in 2 wks.  - Continue sildenafil 20 tid for RV.   - Warfarin for INR 2-2.5.   - His [employer] device is at [employer].  He has never been shocked.  We have discussed generator replacement vs no replacement.  He is inclined to avoid further procedures, so I told him that I thought it would be reasonable not to replace generator.  2. Tricuspid regurgitation:  Tricuspid repair not done at time of  VAD due to proximity of ICD wires and hypotension during surgery.  He has severe TR.  3. Atrial fibrillation: Chronic.  ECG today is difficult due to artifact, but he appears to be in atrial flutter vs fibrillation.   - Atrial fibrillation now seems chronic, he is off amiodarone with resolution of nausea.  I will not restart it.  4. CKD stage IV: Suspect intra-op hypotension at the time of LVAD placement led to development of ATN => urine sediment looked like ATN per renal. Had initial CVVH and transitioned over to HD. He is now off HD with creatinine stable, most recently 2.5.  - BMET today.  - Follow with nephrology.  5. CAD: History of PCI to OM1 in 2007 and RCA in 2013.  No CP or ACs.  - Deferred cardiac cath in 12/22 due to AKI and plan for VAD - Continue Crestor.   6. Abnormal thyroid function tests: Patient has had elevated TSH and free T4, mixed pattern. We have stopped amiodarone.  - He follows with endocrinology.   7. Nausea: This seems to have been due to amiodarone, resolved off amiodarone.   Followup in 2 months.   Marca Ancona 06/17/2023

## 2023-06-17 NOTE — Patient Instructions (Signed)
Increase Lasix to 80mg  daily Start Potassium daily BMET 2 weeks Follow up  in VAD Clinic

## 2023-06-17 NOTE — Progress Notes (Signed)
Cardiology Office Note Date:  06/18/2023  Patient ID:  Isaac [name], Isaac [name] 1951/04/30, MRN [id number] PCP:  Jerl Mina, MD  Cardiologist:  Marca Ancona, MD Electrophysiologist: Sherryl Manges, MD     Chief Complaint: ICD at ERI  History of Present Illness: Isaac [name] is a 72 y.o. male with PMH notable for VT, NICM, s/p ICD, LVAD, afib, CAD s/p PCI; seen today for Sherryl Manges, MD for acute visit due to ICD at [facility].   He last saw Dr. Graciela Husbands 10/2022, overall doing well, having some dyspnea and lightheadedness. ICD nearing ERI. He follows up every 2 months with LVAD clinic, most recently yesterday. He and Dr. Shirlee Latch discussed gen change.   Today, he denies chest pain, chest pressure. No acute concerns today.    Device Information: [employer] dual chamber ICD, imp 10/2012; dx HF  AAD History: Amiodarone, stopped d/t GI upset  Past Medical History:  Diagnosis Date   Arrhythmia    atrial fibrillation   CHF (congestive heart failure) (HCC)    Chronic kidney disease    Coronary artery disease    Hyperlipidemia    Hypertension    Myocardial infarct [facility])     Past Surgical History:  Procedure Laterality Date   CARDIAC DEFIBRILLATOR PLACEMENT  feb 2014   INSERTION OF IMPLANTABLE LEFT VENTRICULAR ASSIST DEVICE N/A 09/17/2021   Procedure: INSERTION OF IMPLANTABLE LEFT VENTRICULAR ASSIST DEVICE AND INSERTION OF FEMORAL ARTERIAL LINE;  Surgeon: Alleen Borne, MD;  Location: [facility] OR;  Service: Open Heart Surgery;  Laterality: N/A;   IR FLUORO GUIDE CV LINE RIGHT  09/27/2021   IR THORACENTESIS ASP PLEURAL SPACE W/IMG GUIDE  09/27/2021   IR US GUIDE VASC ACCESS RIGHT  09/27/2021   PLACEMENT OF IMPELLA LEFT VENTRICULAR ASSIST DEVICE N/A 09/12/2021   Procedure: PLACEMENT OF IMPELLA 5.5 LEFT VENTRICULAR ASSIST DEVICE;  Surgeon: Loreli Slot, MD;  Location: [facility] OR;  Service: Open Heart Surgery;  Laterality: N/A;   RIGHT HEART CATH N/A 09/08/2021   Procedure: RIGHT HEART CATH;   Surgeon: Laurey Morale, MD;  Location: [facility] INVASIVE CV LAB;  Service: Cardiovascular;  Laterality: N/A;   TEE WITHOUT CARDIOVERSION N/A 09/12/2021   Procedure: TRANSESOPHAGEAL ECHOCARDIOGRAM (TEE);  Surgeon: Loreli Slot, MD;  Location: [facility] OR;  Service: Open Heart Surgery;  Laterality: N/A;   TEE WITHOUT CARDIOVERSION N/A 09/17/2021   Procedure: TRANSESOPHAGEAL ECHOCARDIOGRAM (TEE);  Surgeon: Alleen Borne, MD;  Location: [facility] OR;  Service: Open Heart Surgery;  Laterality: N/A;    Current Outpatient Medications  Medication Instructions   albuterol (VENTOLIN HFA) 108 (90 Base) MCG/ACT inhaler 2 puffs, Inhalation, Every 6 hours PRN   fluticasone (FLONASE) 50 MCG/ACT nasal spray 1 spray, Each Nare, Daily   furosemide (LASIX) 80 mg, Oral, Daily   metoCLOPramide (REGLAN) 5 mg, Oral, 4 times daily   ondansetron (ZOFRAN) 4 mg, Every 8 hours PRN   pantoprazole (PROTONIX) 40 mg, Oral, Daily   potassium chloride SA (KLOR-CON M) 20 MEQ tablet 20 mEq, Oral,  Once   rosuvastatin (CRESTOR) 10 mg, Oral, Daily   sildenafil (REVATIO) 20 mg, Oral, 3 times daily   warfarin (COUMADIN) 2 mg, Oral, Daily-1600    Social History:  The patient  reports that he has quit smoking. He has never used smokeless tobacco. He reports that he does not drink alcohol and does not use drugs.   Family History:   The patient's family history includes Heart attack in his father; Heart failure  in his father; Hyperlipidemia in his father; Hypertension in his father and mother.  ROS:  Please see the history of present illness. All other systems are reviewed and otherwise negative.   PHYSICAL EXAM:  VS:  Pulse 86   Ht 5\' 9"  (1.753 m)   Wt 191 lb 2 oz (86.7 kg)   SpO2 98%   BMI 28.22 kg/m  BMI: Body mass index is 28.22 kg/m.  GEN- The patient is well appearing, alert and oriented x 3 today.   Lungs- Clear to ausculation bilaterally, normal work of breathing.  Heart- LVAD hum appreciated Extremities- No  peripheral edema, warm, dry Skin-  device pocket well-healed, no tethering   Device interrogation done today and reviewed by myself:  Battery at [facility] Brief NSVT episodes noted Explant alarm turned OFF  EKG is not ordered.        Recent Labs: 07/02/2022: B Natriuretic Peptide 501.5 11/27/2022: Magnesium 2.2 02/11/2023: TSH 4.095 04/15/2023: ALT 20 06/17/2023: BUN 20; Creatinine, Ser 2.34; Hemoglobin 11.0; Platelets 258; Potassium 3.0; Sodium 138  No results found for requested labs within last 365 days.   Estimated Creatinine Clearance: 31.6 mL/min (A) (by C-G formula based on SCr of 2.34 mg/dL (H)).   Wt Readings from Last 3 Encounters:  06/18/23 191 lb 2 oz (86.7 kg)  06/17/23 191 lb 6.4 oz (86.8 kg)  04/20/23 190 lb 3.2 oz (86.3 kg)     Additional studies reviewed include: Previous EP, cardiology notes.   TTE, 04/15/2023  1. LVAD inflow cannula at apex. Septum is midline to slightly leftward. Left ventricular ejection fraction, by estimation, is <20%. The left ventricle has severely decreased function. The left ventricle demonstrates global hypokinesis. The left ventricular internal cavity size was mildly dilated. Left ventricular diastolic parameters are indeterminate.   2. Right ventricular systolic function is moderately reduced. The right ventricular size is moderately enlarged.   3. Left atrial size was mildly dilated.   4. Right atrial size was mildly dilated.   5. The mitral valve is normal in structure. Trivial mitral valve regurgitation. No evidence of mitral stenosis.   6. The tricuspid valve is abnormal. Tricuspid valve regurgitation is severe.   7. Aortic valve opens every 3rd beat. The aortic valve is tricuspid. Aortic valve regurgitation is mild.   8. Aortic dilatation noted. There is mild dilatation of the aortic root, measuring 38 mm. There is mild dilatation of the ascending aorta, measuring 42 mm.   9. The inferior vena cava is normal in size with greater than 50%  respiratory variability, suggesting right atrial pressure of 3 mmHg.   ASSESSMENT AND PLAN:  #) VT #) ICM #) ICD in situ #) LVAD in situ Patient reached ERI on device 06/09/2023 Long discussion with patient regarding whether to proceed with gen change. He is not able to hold warfarin for gen change d/t LVAD, so procedure would be considerably higher risk for bleeding and infection. He verbalized understanding and does not wish to proceed with gen change. Discussed that if he feels palpitations, unexplained fatigue, N/V, to proceed to ER for further eval.      Current medicines are reviewed at length with the patient today.   The patient does not have concerns regarding his medicines.  The following changes were made today:  none  Labs/ tests ordered today include:  No orders of the defined types were placed in this encounter.    Disposition: Follow up with Dr. Graciela Husbands or EP APP  PRN  Signed, Sherie Don, NP  06/18/23  1:40 PM  Electrophysiology [facility]

## 2023-06-18 ENCOUNTER — Encounter: Payer: Self-pay | Admitting: Cardiology

## 2023-06-18 ENCOUNTER — Ambulatory Visit: Payer: Medicare HMO | Attending: Internal Medicine | Admitting: Cardiology

## 2023-06-18 VITALS — HR 86 | Ht 69.0 in | Wt 191.1 lb

## 2023-06-18 DIAGNOSIS — Z95811 Presence of heart assist device: Secondary | ICD-10-CM

## 2023-06-18 DIAGNOSIS — I472 Ventricular tachycardia, unspecified: Secondary | ICD-10-CM | POA: Diagnosis not present

## 2023-06-18 DIAGNOSIS — I429 Cardiomyopathy, unspecified: Secondary | ICD-10-CM | POA: Diagnosis not present

## 2023-06-18 DIAGNOSIS — Z95810 Presence of automatic (implantable) cardiac defibrillator: Secondary | ICD-10-CM

## 2023-06-18 LAB — CUP PACEART INCLINIC DEVICE CHECK
Date Time Interrogation Session: 20240927151433
HighPow Impedance: 39 Ohm
HighPow Impedance: 50 Ohm
Implantable Lead Connection Status: 753985
Implantable Lead Connection Status: 753985
Implantable Lead Implant Date: 20140205
Implantable Lead Implant Date: 20140205
Implantable Lead Location: 753859
Implantable Lead Location: 753860
Implantable Lead Model: 296
Implantable Lead Model: 4470
Implantable Lead Serial Number: 727876
Implantable Pulse Generator Implant Date: 20140205
Lead Channel Impedance Value: 343 Ohm
Lead Channel Impedance Value: 396 Ohm
Lead Channel Setting Pacing Amplitude: 3.5 V
Lead Channel Setting Pacing Pulse Width: 0.5 ms
Lead Channel Setting Sensing Sensitivity: 0.6 mV
Pulse Gen Serial Number: 113490
Zone Setting Status: 755011

## 2023-06-18 NOTE — Patient Instructions (Signed)
Medication Instructions:  The current medical regimen is effective;  continue present plan and medications.  *If you need a refill on your cardiac medications before your next appointment, please call your pharmacy*   Follow-Up: At [facility], you and your health needs are our priority.  As part of our continuing mission to provide you with exceptional heart care, we have created designated Provider Care Teams.  These Care Teams include your primary Cardiologist (physician) and Advanced Practice Providers (APPs -  Physician Assistants and Nurse Practitioners) who all work together to provide you with the care you need, when you need it.  We recommend signing up for the patient portal called "MyChart".  Sign up information is provided on this After Visit Summary.  MyChart is used to connect with patients for Virtual Visits (Telemedicine).  Patients are able to view lab/test results, encounter notes, upcoming appointments, etc.  Non-urgent messages can be sent to your provider as well.   To learn more about what you can do with MyChart, go to [email or web address].    Your next appointment:   As needed  Provider:   Sherie Don, NP

## 2023-06-19 LAB — TSH: TSH: 4.92 u[IU]/mL — ABNORMAL HIGH (ref 0.450–4.500)

## 2023-06-19 LAB — THYROGLOBULIN ANTIBODY: Thyroglobulin Antibody: <1 [IU]/mL (ref 0.0–0.9)

## 2023-06-19 LAB — THYROID PEROXIDASE ANTIBODY: Thyroperoxidase Ab SerPl-aCnc: <9 [IU]/mL (ref 0–34)

## 2023-06-19 LAB — T4, FREE: Free T4: 2.29 ng/dL — ABNORMAL HIGH (ref 0.82–1.77)

## 2023-06-19 LAB — T3, FREE: T3, Free: 2.3 pg/mL (ref 2.0–4.4)

## 2023-06-22 ENCOUNTER — Ambulatory Visit (INDEPENDENT_AMBULATORY_CARE_PROVIDER_SITE_OTHER): Payer: Medicare HMO | Admitting: "Endocrinology

## 2023-06-22 ENCOUNTER — Encounter: Payer: Self-pay | Admitting: "Endocrinology

## 2023-06-22 VITALS — Ht 69.0 in | Wt 194.4 lb

## 2023-06-22 DIAGNOSIS — R7989 Other specified abnormal findings of blood chemistry: Secondary | ICD-10-CM

## 2023-06-22 MED ORDER — PREDNISONE 5 MG PO TABS: 5.0000 mg | ORAL_TABLET | Freq: Every day | ORAL | 0 refills | Status: DC

## 2023-06-22 NOTE — Progress Notes (Signed)
06/22/2023, 2:52 PM  Endocrinology follow-up note   Subjective:    Patient ID: Isaac [name], male    DOB: 06/05/1951, PCP Isaac Mina, MD   Past Medical History:  Diagnosis Date   Arrhythmia    atrial fibrillation   CHF (congestive heart failure) (HCC)    Chronic kidney disease    Coronary artery disease    Hyperlipidemia    Hypertension    Myocardial infarct [facility])    Past Surgical History:  Procedure Laterality Date   CARDIAC DEFIBRILLATOR PLACEMENT  feb 2014   INSERTION OF IMPLANTABLE LEFT VENTRICULAR ASSIST DEVICE N/A 09/17/2021   Procedure: INSERTION OF IMPLANTABLE LEFT VENTRICULAR ASSIST DEVICE AND INSERTION OF FEMORAL ARTERIAL LINE;  Surgeon: Alleen Borne, MD;  Location: [facility] OR;  Service: Open Heart Surgery;  Laterality: N/A;   IR FLUORO GUIDE CV LINE RIGHT  09/27/2021   IR THORACENTESIS ASP PLEURAL SPACE W/IMG GUIDE  09/27/2021   IR US GUIDE VASC ACCESS RIGHT  09/27/2021   PLACEMENT OF IMPELLA LEFT VENTRICULAR ASSIST DEVICE N/A 09/12/2021   Procedure: PLACEMENT OF IMPELLA 5.5 LEFT VENTRICULAR ASSIST DEVICE;  Surgeon: Loreli Slot, MD;  Location: [facility] OR;  Service: Open Heart Surgery;  Laterality: N/A;   RIGHT HEART CATH N/A 09/08/2021   Procedure: RIGHT HEART CATH;  Surgeon: Laurey Morale, MD;  Location[facility] INVASIVE CV LAB;  Service: Cardiovascular;  Laterality: N/A;   TEE WITHOUT CARDIOVERSION N/A 09/12/2021   Procedure: TRANSESOPHAGEAL ECHOCARDIOGRAM (TEE);  Surgeon: Loreli Slot, MD;  Location: [facility] OR;  Service: Open Heart Surgery;  Laterality: N/A;   TEE WITHOUT CARDIOVERSION N/A 09/17/2021   Procedure: TRANSESOPHAGEAL ECHOCARDIOGRAM (TEE);  Surgeon: Alleen Borne, MD;  Location: [facility] OR;  Service: Open Heart Surgery;  Laterality: N/A;   Social History   Socioeconomic History   Marital status: Married    Spouse name: Not on file   Number of children: Not on file   Years of education:  Not on file   Highest education level: Not on file  Occupational History   Not on file  Tobacco Use   Smoking status: Former   Smokeless tobacco: Never  Vaping Use   Vaping status: Never Used  Substance and Sexual Activity   Alcohol use: No   Drug use: No   Sexual activity: Not on file  Other Topics Concern   Not on file  Social History Narrative   Not on file   Social Determinants of Health   Financial Resource Strain: Not on file  Food Insecurity: Not on file  Transportation Needs: Not on file  Physical Activity: Not on file  Stress: Not on file  Social Connections: Not on file   Family History  Problem Relation Age of Onset   Hypertension Mother    Hypertension Father    Hyperlipidemia Father    Heart attack Father    Heart failure Father    Outpatient Encounter Medications as of 06/22/2023  Medication Sig   predniSONE (DELTASONE) 5 MG tablet Take 1 tablet (5 mg total) by mouth daily with breakfast.   albuterol (VENTOLIN HFA) 108 (90 Base) MCG/ACT inhaler Inhale 2 puffs into the lungs every 6 (six) hours as needed. (Patient  not taking: Reported on 06/18/2023)   fluticasone (FLONASE) 50 MCG/ACT nasal spray Place 1 spray into both nostrils daily.   furosemide (LASIX) 80 MG tablet Take 1 tablet (80 mg total) by mouth daily.   metoCLOPramide (REGLAN) 5 MG tablet Take 1 tablet (5 mg total) by mouth 4 (four) times daily. (Patient not taking: Reported on 06/17/2023)   ondansetron (ZOFRAN) 4 MG tablet Take 4 mg by mouth every 8 (eight) hours as needed for nausea or vomiting. (Patient not taking: Reported on 04/15/2023)   pantoprazole (PROTONIX) 40 MG tablet TAKE 1 TABLET BY MOUTH EVERY DAY (Patient taking differently: Take 40 mg by mouth daily before breakfast.)   potassium chloride SA (KLOR-CON M) 20 MEQ tablet Take 1 tablet (20 mEq total) by mouth once for 1 dose.   rosuvastatin (CRESTOR) 10 MG tablet TAKE 1 TABLET BY MOUTH EVERY DAY (Patient taking differently: Take 10 mg by  mouth at bedtime.)   sildenafil (REVATIO) 20 MG tablet Take 1 tablet (20 mg total) by mouth 3 (three) times daily.   warfarin (COUMADIN) 2 MG tablet Take 1 tablet (2 mg total) by mouth daily at 4 PM.   No facility-administered encounter medications on file as of 06/22/2023.   ALLERGIES: Allergies  Allergen Reactions   Mushroom Extract Complex Nausea And Vomiting   Doxycycline Nausea And Vomiting   Neosporin [Neomycin-Bacitracin Zn-Polymyx] Hives   Tape Other (See Comments)    Some tapes/dressings can irritate the skin    VACCINATION STATUS:  There is no immunization history on file for this patient.  HPI Isaac [name] is 72 y.o. male who presents today with a medical history as above. he is being seen in follow-up after he was seen consultation for abnormal thyroid function tests requested by Isaac Mina, MD.  He is accompanied by his wife to clinic.  Patient with multiple medical problems including coronary artery disease, CVA, CKD, CHF, tachyarrhythmia.  She was recently found to have fluctuating thyroid function tests including times of high TSH and most recently elevated free T4.  He presents with similar findings and with labs.  Of note, he came off of amiodarone only a week ago.   He is prior thyroid uptake and scan was consistent with low uptake of 1.5% in 24 hours.  He currently denies palpitations, heat intolerance.  He presents with mildly fluctuating body weight, gained 3 pounds since last visit.    He has CHF with recent ejection fraction of 25%, in December 2022 underwent HM 3 LVAD placement. He does not exert much at baseline. He denies dysphagia, shortness of breath, nor voice change.  He denies family history of thyroid dysfunction or thyroid malignancy. He was not put on any thyroid hormone nor antithyroid medications so far.  He is a former smoker.  He is also known to have hyperlipidemia and hypertension.    Review of Systems  Constitutional: + Recent  weight gain,  +fatigue, no subjective hyperthermia, no subjective hypothermia Eyes: no blurry vision, no xerophthalmia ENT: no sore throat, no nodules palpated in throat, no dysphagia/odynophagia, no hoarseness   Objective:       06/22/2023    2:29 PM 06/18/2023    1:08 PM 06/17/2023    3:20 PM  Vitals with BMI  Height 5\' 9"  5\' 9"    Weight 194 lbs 6 oz 191 lbs 2 oz   BMI 28.69 28.21   Systolic   80  Diastolic   0  Pulse  86  Ht 5\' 9"  (1.753 m)   Wt 194 lb 6.4 oz (88.2 kg)   BMI 28.71 kg/m   Wt Readings from Last 3 Encounters:  06/22/23 194 lb 6.4 oz (88.2 kg)  06/18/23 191 lb 2 oz (86.7 kg)  06/17/23 191 lb 6.4 oz (86.8 kg)    Physical Exam  Constitutional:  Body mass index is 28.71 kg/m.,  not in acute distress, normal state of mind Eyes: PERRLA, EOMI, no exophthalmos ENT: moist mucous membranes, no gross thyromegaly, no gross cervical lymphadenopathy   CMP ( most recent) CMP     Component Value Date/Time   NA 138 06/17/2023 1111   NA 141 07/06/2014 1425   K 3.0 (L) 06/17/2023 1111   K 3.6 07/06/2014 1425   CL 108 06/17/2023 1111   CL 110 (H) 07/06/2014 1425   CO2 23 06/17/2023 1111   CO2 30 07/06/2014 1425   GLUCOSE 90 06/17/2023 1111   GLUCOSE 93 07/06/2014 1425   BUN 20 06/17/2023 1111   BUN 15 07/06/2014 1425   CREATININE 2.34 (H) 06/17/2023 1111   CREATININE 1.45 (H) 07/06/2014 1425   CALCIUM 8.3 (L) 06/17/2023 1111   CALCIUM 8.5 07/06/2014 1425   PROT 6.0 (L) 04/15/2023 1044   ALBUMIN 2.8 (L) 04/15/2023 1044   AST 21 04/15/2023 1044   ALT 20 04/15/2023 1044   ALKPHOS 125 04/15/2023 1044   BILITOT 1.2 04/15/2023 1044   GFRNONAA 29 (L) 06/17/2023 1111   GFRNONAA 52 (L) 07/06/2014 1425     Diabetic Labs (most recent): Lab Results  Component Value Date   HGBA1C 6.1 (H) 08/23/2021     Lipid Panel ( most recent) Lipid Panel     Component Value Date/Time   CHOL 94 09/08/2021 0141   TRIG 54 09/08/2021 0141   HDL 25 (L) 09/08/2021 0141    CHOLHDL 3.8 09/08/2021 0141   VLDL 11 09/08/2021 0141   LDLCALC 58 09/08/2021 0141      Lab Results  Component Value Date   TSH 4.920 (H) 06/18/2023   TSH 4.095 02/11/2023   TSH 4.992 (H) 12/09/2022   TSH 5.244 (H) 10/07/2022   TSH 4.695 (H) 04/07/2022   FREET4 2.29 (H) 06/18/2023   FREET4 1.77 (H) 02/11/2023   FREET4 1.48 (H) 12/09/2022   FREET4 1.54 (H) 10/07/2022   FREET4 1.49 (H) 01/20/2022         Thyroid uptake and scan on April 13, 2023 FINDINGS: Planar imaging of the thyroid was obtained in multiple projections. There is limited visualization of the thyroid gland due to minimal uptake of radiotracer by the thyroid parenchyma.   4 hour I-123 uptake = 1.0% (normal 5-20%)   24 hour I-123 uptake = 1.5% (normal 10-30%)   IMPRESSION: 1. Markedly diminished iodine uptake values at 4 hours and 24 hours. Evaluation of the thyroid gland is limited due to decreased parenchymal uptake of radiotracer.   Assessment & Plan:   1. Abnormal thyroid blood test    - Kamryn Benshoof  is being seen at a kind request of Isaac Mina, MD. - I have reviewed his  new and available thyroid records and clinically evaluated the patient. 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  Noticed that he was taken off of amiodarone last week.  He is likely having relapse of thyroiditis.  He would benefit from short-term low-dose prednisone.  I discussed initiated prednisone 5 mg p.o. daily at breakfast for the next  15 days and repeat thyroid function test in 3 months.  Since he never had a suppressed TSH, to rule out the rare possibility of secondary hyperthyroidism, even though this is not associated with low uptake, alpha subunit will be included in these labs.  He denies visual field deficit, headaches.   He does not have neck pain,.   The natural course of subacute thyroiditis was discussed with her including the possibility  of future thyroid dysfunction.   - he is advised to maintain close follow up with Isaac Mina, MD for primary care needs.  I spent  22  minutes in the care of the patient today including review of labs from Thyroid Function, CMP, and other relevant labs ; imaging/biopsy records (current and previous including abstractions from other facilities); face-to-face time discussing  his lab results and symptoms, medications doses, his options of short and long term treatment based on the latest standards of care / guidelines;   and documenting the encounter.  Isaac [name]  participated in the discussions, expressed understanding, and voiced agreement with the above plans.  All questions were answered to his satisfaction. he is encouraged to contact clinic should he have any questions or concerns prior to his return visit.  Follow up plan: Return in about 3 months (around 09/22/2023) for F/U with Pre-visit Labs.   Marquis Lunch, MD [facility] [address] [place], [place] [address] Phone: [phone]  Fax: [phone]     06/22/2023, 2:52 PM  This note was partially dictated with voice recognition software. Similar sounding words can be transcribed inadequately or may not  be corrected upon review.

## 2023-06-22 NOTE — Telephone Encounter (Signed)
Per Dr Graciela Husbands pt's ICD generator will not be replaced due to LVAD.

## 2023-06-24 LAB — POCT INR: INR: 2.5 (ref 2.0–3.0)

## 2023-06-25 ENCOUNTER — Ambulatory Visit (HOSPITAL_COMMUNITY): Payer: Self-pay

## 2023-06-25 ENCOUNTER — Other Ambulatory Visit (HOSPITAL_COMMUNITY): Payer: Self-pay | Admitting: Unknown Physician Specialty

## 2023-06-25 DIAGNOSIS — Z95811 Presence of heart assist device: Secondary | ICD-10-CM

## 2023-06-25 NOTE — Progress Notes (Signed)
LVAD INR 

## 2023-06-29 ENCOUNTER — Encounter: Payer: Medicare HMO | Admitting: Internal Medicine

## 2023-06-29 NOTE — Progress Notes (Signed)
Remote ICD transmission.   

## 2023-07-01 ENCOUNTER — Telehealth (HOSPITAL_COMMUNITY): Payer: Self-pay

## 2023-07-01 ENCOUNTER — Ambulatory Visit (HOSPITAL_COMMUNITY)
Admission: RE | Admit: 2023-07-01 | Discharge: 2023-07-01 | Disposition: A | Discharge status: Home / Self Care | Payer: Medicare HMO | Source: Ambulatory Visit | Attending: Cardiology | Admitting: Cardiology

## 2023-07-01 ENCOUNTER — Other Ambulatory Visit (HOSPITAL_COMMUNITY): Payer: Self-pay

## 2023-07-01 DIAGNOSIS — Z4801 Encounter for change or removal of surgical wound dressing: Secondary | ICD-10-CM | POA: Diagnosis not present

## 2023-07-01 DIAGNOSIS — Z95811 Presence of heart assist device: Secondary | ICD-10-CM | POA: Insufficient documentation

## 2023-07-01 LAB — BASIC METABOLIC PANEL
Anion gap: 10 (ref 5–15)
BUN: 19 mg/dL (ref 8–23)
CO2: 25 mmol/L (ref 22–32)
Calcium: 8.5 mg/dL — ABNORMAL LOW (ref 8.9–10.3)
Chloride: 104 mmol/L (ref 98–111)
Creatinine, Ser: 2.24 mg/dL — ABNORMAL HIGH (ref 0.61–1.24)
GFR, Estimated: 31 mL/min — ABNORMAL LOW (ref >60)
Glucose, Bld: 80 mg/dL (ref 70–99)
Potassium: 3.4 mmol/L — ABNORMAL LOW (ref 3.5–5.1)
Sodium: 139 mmol/L (ref 135–145)

## 2023-07-01 MED ORDER — POTASSIUM CHLORIDE CRYS ER 20 MEQ PO TBCR: 40.0000 meq | EXTENDED_RELEASE_TABLET | Freq: Once | ORAL | 6 refills | Status: DC

## 2023-07-01 NOTE — Telephone Encounter (Signed)
Pt called about BMET results. Per Dr.McLean increase Potassium to daily (2 pills daily). Detailed VM left with instructions and call back instructions.  Simmie Davies RN, BSN VAD Coordinator 24/7 Pager [phone]

## 2023-07-05 ENCOUNTER — Ambulatory Visit (HOSPITAL_COMMUNITY): Payer: Self-pay | Admitting: Pharmacist

## 2023-07-05 LAB — POCT INR: INR: 2.6 (ref 2.0–3.0)

## 2023-07-12 ENCOUNTER — Ambulatory Visit (HOSPITAL_COMMUNITY): Payer: Self-pay | Admitting: Pharmacist

## 2023-07-12 LAB — POCT INR: INR: 2.3 (ref 2.0–3.0)

## 2023-07-15 ENCOUNTER — Ambulatory Visit (INDEPENDENT_AMBULATORY_CARE_PROVIDER_SITE_OTHER): Payer: Medicare HMO

## 2023-07-15 DIAGNOSIS — I429 Cardiomyopathy, unspecified: Secondary | ICD-10-CM

## 2023-07-15 LAB — CUP PACEART REMOTE DEVICE CHECK
Brady Statistic RA Percent Paced: 0 %
Brady Statistic RV Percent Paced: 7 %
Date Time Interrogation Session: 20241024043900
HighPow Impedance: 52 Ohm
Implantable Lead Connection Status: 753985
Implantable Lead Connection Status: 753985
Implantable Lead Implant Date: 20140205
Implantable Lead Implant Date: 20140205
Implantable Lead Location: 753859
Implantable Lead Location: 753860
Implantable Lead Model: 296
Implantable Lead Model: 4470
Implantable Lead Serial Number: 727876
Implantable Pulse Generator Implant Date: 20140205
Lead Channel Impedance Value: 341 Ohm
Lead Channel Impedance Value: 397 Ohm
Lead Channel Pacing Threshold Amplitude: 1.5 V
Lead Channel Pacing Threshold Amplitude: 3.4 V
Lead Channel Pacing Threshold Pulse Width: 0.5 ms
Lead Channel Pacing Threshold Pulse Width: 2 ms
Lead Channel Setting Pacing Amplitude: 3.5 V
Lead Channel Setting Pacing Pulse Width: 0.5 ms
Lead Channel Setting Sensing Sensitivity: 0.6 mV
Pulse Gen Serial Number: 113490
Zone Setting Status: 755011

## 2023-07-19 ENCOUNTER — Ambulatory Visit (HOSPITAL_COMMUNITY): Payer: Self-pay | Admitting: Pharmacist

## 2023-07-19 LAB — POCT INR: INR: 1.8 — AB (ref 2.0–3.0)

## 2023-07-23 ENCOUNTER — Ambulatory Visit (HOSPITAL_COMMUNITY): Payer: Self-pay | Admitting: Student-PharmD

## 2023-07-23 LAB — POCT INR: INR: 2.3 (ref 2.0–3.0)

## 2023-07-29 ENCOUNTER — Ambulatory Visit (HOSPITAL_COMMUNITY): Payer: Self-pay | Admitting: Pharmacist

## 2023-07-29 LAB — POCT INR: INR: 1.9 — AB (ref 2.0–3.0)

## 2023-08-03 NOTE — Progress Notes (Signed)
Remote ICD transmission.   

## 2023-08-05 ENCOUNTER — Ambulatory Visit (HOSPITAL_COMMUNITY): Payer: Self-pay

## 2023-08-05 LAB — POCT INR: INR: 1.8 — AB (ref 2.0–3.0)

## 2023-08-05 NOTE — Progress Notes (Signed)
LVAD INR 

## 2023-08-12 ENCOUNTER — Ambulatory Visit (HOSPITAL_COMMUNITY): Payer: Self-pay | Admitting: Pharmacist

## 2023-08-12 LAB — POCT INR: INR: 2 (ref 2.0–3.0)

## 2023-08-16 ENCOUNTER — Ambulatory Visit: Payer: Medicare HMO

## 2023-08-16 DIAGNOSIS — I5022 Chronic systolic (congestive) heart failure: Secondary | ICD-10-CM

## 2023-08-16 DIAGNOSIS — I429 Cardiomyopathy, unspecified: Secondary | ICD-10-CM

## 2023-08-18 ENCOUNTER — Ambulatory Visit (HOSPITAL_COMMUNITY): Payer: Self-pay | Admitting: Pharmacist

## 2023-08-18 LAB — POCT INR: INR: 2.2 (ref 2.0–3.0)

## 2023-08-26 ENCOUNTER — Ambulatory Visit (HOSPITAL_COMMUNITY): Payer: Self-pay | Admitting: Pharmacist

## 2023-08-26 LAB — POCT INR: INR: 2 (ref 2.0–3.0)

## 2023-08-27 ENCOUNTER — Other Ambulatory Visit (HOSPITAL_COMMUNITY): Payer: Self-pay

## 2023-08-27 DIAGNOSIS — Z95811 Presence of heart assist device: Secondary | ICD-10-CM

## 2023-08-27 DIAGNOSIS — Z7901 Long term (current) use of anticoagulants: Secondary | ICD-10-CM

## 2023-08-30 ENCOUNTER — Ambulatory Visit (HOSPITAL_COMMUNITY)
Admission: RE | Admit: 2023-08-30 | Discharge: 2023-08-30 | Disposition: A | Discharge status: Home / Self Care | Payer: Medicare HMO | Source: Ambulatory Visit | Attending: Cardiology | Admitting: Cardiology

## 2023-08-30 ENCOUNTER — Ambulatory Visit (HOSPITAL_COMMUNITY): Payer: Self-pay | Admitting: Pharmacist

## 2023-08-30 ENCOUNTER — Encounter (HOSPITAL_COMMUNITY): Payer: Medicare HMO | Admitting: Cardiology

## 2023-08-30 ENCOUNTER — Encounter (HOSPITAL_COMMUNITY): Payer: Self-pay | Admitting: Cardiology

## 2023-08-30 DIAGNOSIS — R946 Abnormal results of thyroid function studies: Secondary | ICD-10-CM | POA: Diagnosis not present

## 2023-08-30 DIAGNOSIS — Z79899 Other long term (current) drug therapy: Secondary | ICD-10-CM | POA: Diagnosis not present

## 2023-08-30 DIAGNOSIS — I482 Chronic atrial fibrillation, unspecified: Secondary | ICD-10-CM | POA: Diagnosis not present

## 2023-08-30 DIAGNOSIS — Z95811 Presence of heart assist device: Secondary | ICD-10-CM | POA: Insufficient documentation

## 2023-08-30 DIAGNOSIS — Z7901 Long term (current) use of anticoagulants: Secondary | ICD-10-CM | POA: Insufficient documentation

## 2023-08-30 DIAGNOSIS — I2510 Atherosclerotic heart disease of native coronary artery without angina pectoris: Secondary | ICD-10-CM | POA: Diagnosis not present

## 2023-08-30 DIAGNOSIS — I429 Cardiomyopathy, unspecified: Secondary | ICD-10-CM | POA: Diagnosis not present

## 2023-08-30 DIAGNOSIS — N184 Chronic kidney disease, stage 4 (severe): Secondary | ICD-10-CM | POA: Insufficient documentation

## 2023-08-30 DIAGNOSIS — Z452 Encounter for adjustment and management of vascular access device: Secondary | ICD-10-CM | POA: Insufficient documentation

## 2023-08-30 DIAGNOSIS — I130 Hypertensive heart and chronic kidney disease with heart failure and stage 1 through stage 4 chronic kidney disease, or unspecified chronic kidney disease: Secondary | ICD-10-CM | POA: Diagnosis not present

## 2023-08-30 DIAGNOSIS — Z8249 Family history of ischemic heart disease and other diseases of the circulatory system: Secondary | ICD-10-CM | POA: Insufficient documentation

## 2023-08-30 DIAGNOSIS — I5022 Chronic systolic (congestive) heart failure: Secondary | ICD-10-CM | POA: Insufficient documentation

## 2023-08-30 DIAGNOSIS — Z955 Presence of coronary angioplasty implant and graft: Secondary | ICD-10-CM | POA: Diagnosis not present

## 2023-08-30 LAB — COMPREHENSIVE METABOLIC PANEL
ALT: 18 U/L (ref 0–44)
AST: 22 U/L (ref 15–41)
Albumin: 3.4 g/dL — ABNORMAL LOW (ref 3.5–5.0)
Alkaline Phosphatase: 131 U/L — ABNORMAL HIGH (ref 38–126)
Anion gap: 11 (ref 5–15)
BUN: 28 mg/dL — ABNORMAL HIGH (ref 8–23)
CO2: 22 mmol/L (ref 22–32)
Calcium: 9.3 mg/dL (ref 8.9–10.3)
Chloride: 108 mmol/L (ref 98–111)
Creatinine, Ser: 3.07 mg/dL — ABNORMAL HIGH (ref 0.61–1.24)
GFR, Estimated: 21 mL/min — ABNORMAL LOW (ref >60)
Glucose, Bld: 78 mg/dL (ref 70–99)
Potassium: 3.7 mmol/L (ref 3.5–5.1)
Sodium: 141 mmol/L (ref 135–145)
Total Bilirubin: 1.3 mg/dL — ABNORMAL HIGH (ref <1.2)
Total Protein: 7 g/dL (ref 6.5–8.1)

## 2023-08-30 LAB — CBC
HCT: 36.1 % — ABNORMAL LOW (ref 39.0–52.0)
Hemoglobin: 11.5 g/dL — ABNORMAL LOW (ref 13.0–17.0)
MCH: 29.6 pg (ref 26.0–34.0)
MCHC: 31.9 g/dL (ref 30.0–36.0)
MCV: 93 fL (ref 80.0–100.0)
Platelets: 239 10*3/uL (ref 150–400)
RBC: 3.88 MIL/uL — ABNORMAL LOW (ref 4.22–5.81)
RDW: 16.3 % — ABNORMAL HIGH (ref 11.5–15.5)
WBC: 5.3 10*3/uL (ref 4.0–10.5)
nRBC: 0 % (ref 0.0–0.2)

## 2023-08-30 LAB — PROTIME-INR
INR: 1.8 — ABNORMAL HIGH (ref 0.8–1.2)
Prothrombin Time: 21.3 s — ABNORMAL HIGH (ref 11.4–15.2)

## 2023-08-30 LAB — LACTATE DEHYDROGENASE: LDH: 208 U/L — ABNORMAL HIGH (ref 98–192)

## 2023-08-30 LAB — TSH: TSH: 5.884 u[IU]/mL — ABNORMAL HIGH (ref 0.350–4.500)

## 2023-08-30 LAB — PREALBUMIN: Prealbumin: 30 mg/dL (ref 18–38)

## 2023-08-30 NOTE — Progress Notes (Addendum)
Patient presents for 2 month f/u and 2 year Intermacs with annual maintenance in VAD Clinic alone. Denies issues/concerns with VAD equipment or drive line.    Patient ambulated in to clinic independently without difficulty. Denies lightheadedness, dizziness, falls, shortness of breath, and signs of bleeding. States he is remaining active through daily walks with his wife.  Pt stopped per Dr. Shirlee Latch Amiodarone 9/20 after having ongoing issues with nausea and vomiting. Pt states symptoms have improved with only intermittent episodes.  Lasix increased to 80mg  daily with of Potassium last visit. Pt states he took potassium for a couple weeks but stopped due to troubling swallowing the pills. Potassium 3.7 today.   Pt followed by Dr. Fransico Him with [facility]. Thyroid labs drawn today. Pt has follow up in January.  ICD battery at [facility]. Discussed again with Dr Shirlee Latch. Will not plan to change generator at this time. Follow up with Dr. Graciela Husbands scheduled 10/8.  Vital Signs:  Doppler Pressure: 102 Automatic BP: 97/86 (92) HR: 88 SPO2: 99   Weight: 190.6 lb w/ eqt Last weight: 191.4 lb w/eqt   VAD Indication: Destination Therapy due to age and kidney disease   LVAD assessment:  HM III: Speed: 5300 rpms Flow: 4.4 Power: 3.9 w    PI: 3.5 Alarms: none Events: rare   Fixed speed: 5300 Low speed limit: 5000  Primary controller: back up battery due for replacement in 15 months Secondary controller:  back up battery due for replacement in 10 months   I reviewed the LVAD parameters from today and compared the results to the patient's prior recorded data. LVAD interrogation was NEGATIVE for significant power changes, NEGATIVE for clinical alarms and STABLE for PI events/speed drops. No programming changes were made and pump is functioning within specified parameters. Pt is performing daily controller and system monitor self tests along with completing weekly and monthly maintenance for  LVAD equipment.   LVAD equipment check completed and is in good working order. Back-up equipment present. Charged back up battery and performed self-test on equipment.    Annual Equipment Maintenance on UBC/PM was performed on today 08/30/23.   Batteries Manufacture Date: Number of uses: Re-calibration  09/13/22 77 Performed by patient   Annual maintenance completed per [employer] on patient's home power module and universal Magazine features editor.    Backup system controller 11 volt battery charged during visit.   2 year Intermacs follow up completed including:  Quality of Life, KCCQ-12, and Neurocognitive trail making.   Pt completed 1200 feet during 6 minute walk.  Patient Goals: To continue to travel with family  [place] Cardiomyopathy Questionnaire     08/30/2023    3:57 PM 04/15/2023    1:53 PM 04/02/2022    3:35 PM  KCCQ-12  1 a. Ability to shower/bathe Moderately limited Slightly limited Other, Did not do  1 b. Ability to walk 1 block Moderately limited Slightly limited Not at all limited  1 c. Ability to hurry/jog Not at all limited Extremely limited Not at all limited  2. Edema feet/ankles/legs Less than once a week Less than once a week Never over the past 2 weeks  3. Limited by fatigue Several times a day 1-2 times a week Less than once a week  4. Limited by dyspnea At least once a day 1-2 times a week Less than once a week  5. Sitting up / on 3+ pillows Never over the past 2 weeks Never over the past 2 weeks Never over the past  2 weeks  6. Limited enjoyment of life Moderately limited Slightly limited Slightly limited  7. Rest of life w/ symptoms Completely satisfied Mostly satisfied Completely satisfied  8 a. Participation in hobbies Moderately limited Slightly limited Slightly limited  8 b. Participation in chores Did not limit at all Slightly limited Did not limit at all  8 c. Visiting family/friends Did not limit at all Slightly limited Did not limit at all    Exit Site  Care: Wife has been performing weekly dressing changes using weekly dressings. Drive line anchor correctly applied.  Pt given 8 weekly kits, 3 boxes of large tegaderms, and 8 anchors.  Device:  [employer] dual  Therapies: on Pacing: VVI 50 Last check: 06/14/23   BP & Labs:  Doppler 102 - Doppler is reflecting Modified systolic   Hgb 11.5 - No S/S of bleeding. Specifically denies melena/BRBPR or nosebleeds.   LDH stable at 208 with established baseline of 200 - 300. Denies tea-colored urine. No power elevations noted on interrogation.   Patient Instructions:  No medication changes Follow up 2 months Coumadin dosing per Clovis Riley RN,BSN VAD Coordinator  Office: [phone]  24/7 Pager: [phone]       Cardiology: Dr. Shirlee Latch PCP: Jerl Mina, MD  Follow up for Heart Failure/LVAD: 72 y.o. with history of CAD, HTN, atrial fibrillation, and chronic systolic CHF returns for followup of CHF/LVAD.   Patient has had a long-standing cardiomyopathy, EF had been in the 25% range for years.  He had PCI to OM1 in 2007 and RCA in 2013.  From his report, these episodes do not sound like ACS events.  Echo in 12/22 showed EF < 20%, severe LV dilation, restrictive diastolic function, moderate RV dysfunction, moderate MR, mod-severe TR. HF was been complicated by CKD stage 3. He was also noted on device interrogation to have been in atrial fibrillation persistently since 10/22.  He was admitted at [facility] in 12/22.  BP was low, his BP-active meds were stopped and midodrine was begun.  He was diuresed and discharged home.    He reports ongoing severe dyspnea and was admitted again in 12/22 after he stood up then passed out at home.  Device interrogation showed run of VT treated x 2 with ATP possibly around the time of syncope.  RHC in 12/22 showed low output HF and he developed cardiogenic shock. Impella 5.5 was placed.  On 09/17/21, HM3 LVAD was placed, ICD leads were plastered to  the tricuspid valve with severe TR, the valve was not replaced.  The patient had significant interoperative hypotension.  Post-operatively, he developed progressive renal failure and was started on CVVH then iHD.  He had a bumpy post-op course with renal failure and RV failure, but was ultimately able to discharge home on 10/24/21.   Ramp echo in 3/23, RV mildly dilated with normal systolic function, severe TR, LV EF 20-25%, aortic valve opens every beat.  Speed increased to 5300 rpm. The IV septum was mildly leftwards at 5300 rpm but flow increased and AoV still opened every beat.   Patient was admitted in 3/24 with left groin abscess.  This spontaneously drained and was treated with IV antibiotics.  He was sent home on linezolid and Augmentin.  He developed profuse diarrhea that he attributed to linezolid.  Linezolid was stopped and he was continued on Augmentin.   Echo in 7/24 showed EF < 20%, IV septum midline to slightly leftward, RV moderately dilated with moderately decreased systolic function,  IVC dilated with severe TR.  Speed not changed.   Patient returns for LVAD followup.  He has been doing well symptomatically.  Nausea resolved after stopping amiodarone.  He can walk a mile with his wife, mild dyspnea with hills.  No lightheadedness.  No orthopnea/PND.  MAP 92 today.  Weight down 1 lb.   Denies LVAD alarms.  Denies driveline trauma, erythema or drainage.  Denies ICD shocks.   Reports taking Coumadin as prescribed and adherence to anticoagulation based dietary restrictions.  Denies bright red blood per rectum or melena, no dark urine or hematuria.    Labs (3/23): LFTs normal, TSH mildly elevated Labs (7/23): hgb 12.1 Labs (8/23): hgb 11.8 => 9.9, K 4.4, creatinine 2.5 => 3.09, LFTs normal.  Labs (11/23): K 4.5, creatinine 2.95 Labs (1/24): TSH 5.2 Labs (3/24): hgb 10.7, LDH 157, LFTs normal, creatinine 2.75 Labs (5/24): K 3.6, creatinine 2.7, LFTs normal, TSH normal Labs (7/24): hgb  11.7, LDH 172, K 3.5, creatinine 2.33 Labs (10/24): K 3.4, creatinine 2.24  PMH: 1. HTN 2. Hyperlipidemia 3. CAD: H/o PCI to OM1 2007 and PCI to RCA in 2013.  Uncertain of circumstances but do not sound like ACS events.  4. CKD stage IV: Patient was on HD for several months post-LVAD but was able to come off.  5. Atrial fibrillation: Paroxysmal 6. Chronic systolic CHF: Long-standing cardiomyopathy with EF in the 25% range.  Cause uncertain, has history of PCI to RCA and OM1, but degree of CAD does not seem to explain severity of cardiomyopathy. Father had history of CAD/CHF.  Not heavy drinker. Has [employer] ICD.  - Echo (12/22): EF < 20%, severe LV dilation, restrictive diastolic function, moderate RV dysfunction, moderate MR, mod-severe TR.  - 12/22 cardiogenic shock with Impella 5.5 placement then Heartmate 3 LVAD.  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  - Echo (7/24): EF < 20%, IV septum midline to slightly leftward, RV moderately dilated with moderately decreased systolic function, IVC dilated with severe TR. 7. VT - Nausea with amiodarone.  8. RV failure/severe TR 9. Citrobacter UTI (8/23) 20. Left groin abscess 3/24   Current Outpatient Medications  Medication Sig Dispense Refill   furosemide (LASIX) 80 MG tablet Take 1 tablet (80 mg total) by mouth daily. 90 tablet 3   pantoprazole (PROTONIX) 40 MG tablet TAKE 1 TABLET BY MOUTH EVERY DAY (Patient taking differently: Take 40 mg by mouth daily before breakfast.) 90 tablet 3   rosuvastatin (CRESTOR) 10 MG tablet TAKE 1 TABLET BY MOUTH EVERY DAY (Patient taking differently: Take 10 mg by mouth at bedtime.) 90 tablet 3   sildenafil (REVATIO) 20 MG tablet Take 1 tablet (20 mg total) by mouth 3 (three) times daily. 90 tablet 11   warfarin (COUMADIN) 2 MG tablet Take 1 tablet (2 mg total) by mouth daily at 4 PM. 30 tablet 11   albuterol (VENTOLIN HFA) 108 (90 Base) MCG/ACT inhaler Inhale 2  puffs into the lungs every 6 (six) hours as needed. (Patient not taking: Reported on 06/18/2023) 1 each 3   fluticasone (FLONASE) 50 MCG/ACT nasal spray Place 1 spray into both nostrils daily. (Patient not taking: Reported on 08/30/2023)     metoCLOPramide (REGLAN) 5 MG tablet Take 1 tablet (5 mg total) by mouth 4 (four) times daily. (Patient not taking: Reported on 06/17/2023) 120 tablet 6   ondansetron (ZOFRAN) 4 MG tablet Take 4 mg by mouth every 8 (eight) hours as  needed for nausea or vomiting. (Patient not taking: Reported on 04/15/2023)     potassium chloride SA (KLOR-CON M) 20 MEQ tablet Take 2 tablets (40 mEq total) by mouth once for 1 dose. (Patient not taking: Reported on 08/30/2023) 30 tablet 6   predniSONE (DELTASONE) 5 MG tablet Take 1 tablet (5 mg total) by mouth daily with breakfast. (Patient not taking: Reported on 08/30/2023) 15 tablet 0   No current facility-administered medications for this encounter.    Mushroom extract complex (do not select), Doxycycline, Neosporin [neomycin-bacitracin zn-polymyx], and Tape  REVIEW OF SYSTEMS: All systems negative except as listed in HPI, PMH and Problem list.  LVAD INTERROGATION:  See LVAD nurse's note above.   I reviewed the LVAD parameters from today, and compared the results to the patient's prior recorded data.  No programming changes were made.  The LVAD is functioning within specified parameters.  The patient performs LVAD self-test daily.  LVAD interrogation was negative for any significant power changes, alarms or PI events/speed drops.  LVAD equipment check completed and is in good working order.  Back-up equipment present.   LVAD education done on emergency procedures and precautions and reviewed exit site care.    Vitals:   08/30/23 1601 08/30/23 1602  BP: 97/86 (!) 102/0  Pulse: 88   Weight: 86.5 kg (190 lb 9.6 oz)   MAP 92  Physical Exam: General: Well appearing this am. NAD.  HEENT: Normal. Neck: Supple, JVP 7-8 cm.  Carotids OK.  Cardiac:  Mechanical heart sounds with LVAD hum present.  Lungs:  CTAB, normal effort.  Abdomen:  NT, ND, no HSM. No bruits or masses. +BS  LVAD exit site: Well-healed and incorporated. Dressing dry and intact. No erythema or drainage. Stabilization device present and accurately applied. Driveline dressing changed daily per sterile technique. Extremities:  Warm and dry. No cyanosis, clubbing, rash. 1+ ankle edema.   Neuro:  Alert & oriented x 3. Cranial nerves grossly intact. Moves all 4 extremities w/o difficulty. Affect pleasant    ASSESSMENT AND PLAN: 1. Chronic systolic CHF:  Long-standing cardiomyopathy.  [employer] ICD.  Echo in 12/22 with EF < 20%, severe LV dilation, restrictive diastolic function, moderate RV dysfunction, moderate MR, mod-severe TR. Cause of cardiomyopathy is uncertain.  He has a history of CAD, but I do not think that the described CAD from the past could explain his cardiomyopathy, but CAD could have progressed.  He was admitted in 12/22 with cardiogenic shock and Impella 5.5. s/p HM III VAD on 09/17/22.  Post-op course complicated by AKI and RV failure. He was on HD for several months but is now off.  Echo in 7/24 showed EF < 20%, IV septum midline to slightly leftward, RV moderately dilated with moderately decreased systolic function, IVC dilated with severe TR.  Speed not changed.  Today, NYHA class II symptoms, not significantly volume overloaded.  Weight down 1 lb.  - Continue Lasix 80 mg daily but check BMET, may need to cut back.   - Continue sildenafil 20 tid for RV.   - Warfarin for INR 2-2.5.   - His [employer] device is at [employer].  He has never been shocked.  We have discussed generator replacement vs no replacement.  He is inclined to avoid further procedures, so I told him that I thought it would be reasonable not to replace generator.  2. Tricuspid regurgitation:  Tricuspid repair not done at time of VAD due to proximity of ICD wires  and  hypotension during surgery.  He has severe TR.  3. Atrial fibrillation: Chronic at this point.  - Atrial fibrillation now seems chronic, he is off amiodarone with resolution of nausea.  I will not restart it.  4. CKD stage IV: Suspect intra-op hypotension at the time of LVAD placement led to development of ATN => urine sediment looked like ATN per renal. Had initial CVVH and transitioned over to HD. He is now off HD with creatinine stable, most recently 2.24.  - BMET today, if creatinine is higher may need to cut back on Lasix.  - Follow with nephrology.  5. CAD: History of PCI to OM1 in 2007 and RCA in 2013.  No CP or ACs.  - Deferred cardiac cath in 12/22 due to AKI and plan for VAD - Continue Crestor.   6. Abnormal thyroid function tests: Patient has had elevated TSH and free T4, mixed pattern. We have stopped amiodarone.  - He follows with endocrinology.   7. Nausea: This seems to have been due to amiodarone, resolved off amiodarone.   Followup in 2 months.   Marca Ancona 08/31/2023

## 2023-08-30 NOTE — Patient Instructions (Addendum)
No medication changes Follow up 2 months Coumadin dosing per Leotis Shames

## 2023-08-31 ENCOUNTER — Other Ambulatory Visit (HOSPITAL_COMMUNITY): Payer: Self-pay

## 2023-08-31 ENCOUNTER — Telehealth (HOSPITAL_COMMUNITY): Payer: Self-pay

## 2023-08-31 LAB — T4: T4, Total: 13.4 ug/dL — ABNORMAL HIGH (ref 4.5–12.0)

## 2023-08-31 LAB — T3: T3, Total: 86 ng/dL (ref 71–180)

## 2023-08-31 MED ORDER — FUROSEMIDE 20 MG PO TABS: 60.0000 mg | ORAL_TABLET | Freq: Every day | ORAL | 3 refills | Status: DC

## 2023-08-31 NOTE — Telephone Encounter (Signed)
Pt called to give instructions to decrease Lasix to 60mg  daily per Dr. Shirlee Latch. BMET scheduled in 10 days. Pt asked to please schedule follow up with Nephrology.  Simmie Davies RN, BSN VAD Coordinator 24/7 Pager [phone]

## 2023-09-02 ENCOUNTER — Ambulatory Visit (HOSPITAL_COMMUNITY): Payer: Self-pay | Admitting: Pharmacist

## 2023-09-02 LAB — POCT INR: INR: 1.8 — AB (ref 2.0–3.0)

## 2023-09-03 ENCOUNTER — Other Ambulatory Visit (HOSPITAL_COMMUNITY): Payer: Self-pay | Admitting: *Deleted

## 2023-09-03 DIAGNOSIS — I5022 Chronic systolic (congestive) heart failure: Secondary | ICD-10-CM

## 2023-09-03 DIAGNOSIS — Z95811 Presence of heart assist device: Secondary | ICD-10-CM

## 2023-09-08 ENCOUNTER — Ambulatory Visit (HOSPITAL_COMMUNITY)
Admission: RE | Admit: 2023-09-08 | Discharge: 2023-09-08 | Disposition: A | Discharge status: Home / Self Care | Payer: Medicare HMO | Source: Ambulatory Visit | Attending: Cardiology | Admitting: Cardiology

## 2023-09-08 DIAGNOSIS — Z95811 Presence of heart assist device: Secondary | ICD-10-CM | POA: Diagnosis present

## 2023-09-08 DIAGNOSIS — I5022 Chronic systolic (congestive) heart failure: Secondary | ICD-10-CM | POA: Insufficient documentation

## 2023-09-08 LAB — BASIC METABOLIC PANEL
Anion gap: 5 (ref 5–15)
BUN: 24 mg/dL — ABNORMAL HIGH (ref 8–23)
CO2: 24 mmol/L (ref 22–32)
Calcium: 8.7 mg/dL — ABNORMAL LOW (ref 8.9–10.3)
Chloride: 109 mmol/L (ref 98–111)
Creatinine, Ser: 2.37 mg/dL — ABNORMAL HIGH (ref 0.61–1.24)
GFR, Estimated: 29 mL/min — ABNORMAL LOW (ref >60)
Glucose, Bld: 94 mg/dL (ref 70–99)
Potassium: 4 mmol/L (ref 3.5–5.1)
Sodium: 138 mmol/L (ref 135–145)

## 2023-09-09 ENCOUNTER — Ambulatory Visit (HOSPITAL_COMMUNITY): Payer: Self-pay | Admitting: Pharmacist

## 2023-09-09 LAB — POCT INR: INR: 1.7 — AB (ref 2.0–3.0)

## 2023-09-11 ENCOUNTER — Encounter: Payer: Self-pay | Admitting: *Deleted

## 2023-09-11 NOTE — Progress Notes (Signed)
Pt paged stating he is having abdominal cramping. States he thought he may be nauseated, but is not currently nauseated. States he ate an egg sandwich earlier, and cramping has started since eating sandwich. States at times he feels short of breath when he is cramping, and feels his belly is "pushing on his cord". He had a normal bowel movement this morning. Denies issues with, or trauma to, his driveline. They have not checked his VAD numbers recently because he is driving, but numbers were WNL earlier today, and no alarming noted. His wife feels like this may be gas pains.   He and his wife are driving to [place], [place]. Advised if his symptoms worsen and he needs to seek treatment in the ER he will need to use mylvad.[facility] Locator for closest VAD center as he is not in [facility] yet, as well as notifying VAD coordinator. They verbalized understanding.   Alyce Pagan RN, BSN VAD Coordinator  Office: [phone]  Pager: [phone]

## 2023-09-16 ENCOUNTER — Ambulatory Visit (HOSPITAL_COMMUNITY): Payer: Self-pay | Admitting: Student-PharmD

## 2023-09-16 LAB — POCT INR: INR: 2.2 (ref 2.0–3.0)

## 2023-09-16 NOTE — Progress Notes (Signed)
Remote ICD transmission.   

## 2023-09-23 ENCOUNTER — Ambulatory Visit (HOSPITAL_COMMUNITY): Payer: Self-pay | Admitting: Pharmacist

## 2023-09-23 LAB — POCT INR: INR: 2.7 (ref 2.0–3.0)

## 2023-10-01 ENCOUNTER — Ambulatory Visit (HOSPITAL_COMMUNITY): Payer: Self-pay | Admitting: Pharmacist

## 2023-10-01 LAB — POCT INR: INR: 2.4 (ref 2.0–3.0)

## 2023-10-07 ENCOUNTER — Ambulatory Visit (HOSPITAL_COMMUNITY): Payer: Self-pay | Admitting: Pharmacist

## 2023-10-07 LAB — POCT INR: INR: 1.9 — AB (ref 2.0–3.0)

## 2023-10-14 ENCOUNTER — Ambulatory Visit (HOSPITAL_COMMUNITY): Payer: Self-pay | Admitting: Pharmacist

## 2023-10-14 LAB — POCT INR: INR: 1.7 — AB (ref 2.0–3.0)

## 2023-10-21 ENCOUNTER — Other Ambulatory Visit: Payer: Self-pay | Admitting: Internal Medicine

## 2023-10-25 ENCOUNTER — Ambulatory Visit (HOSPITAL_COMMUNITY): Payer: Self-pay | Admitting: Pharmacist

## 2023-10-25 LAB — POCT INR: INR: 1.5 — AB (ref 2.0–3.0)

## 2023-10-26 ENCOUNTER — Encounter: Payer: Self-pay | Admitting: "Endocrinology

## 2023-10-26 ENCOUNTER — Ambulatory Visit: Payer: Medicare HMO | Admitting: "Endocrinology

## 2023-10-26 VITALS — Ht 69.0 in | Wt 189.6 lb

## 2023-10-26 DIAGNOSIS — R7989 Other specified abnormal findings of blood chemistry: Secondary | ICD-10-CM

## 2023-10-26 LAB — TSH: TSH: 3.73 u[IU]/mL (ref 0.450–4.500)

## 2023-10-26 LAB — T4, FREE: Free T4: 2.11 ng/dL — ABNORMAL HIGH (ref 0.82–1.77)

## 2023-10-26 LAB — T3, FREE: T3, Free: 2.6 pg/mL (ref 2.0–4.4)

## 2023-10-26 NOTE — Progress Notes (Signed)
 10/26/2023, 5:17 PM  Endocrinology follow-up note   Subjective:    Patient ID: Isaac [name], male    DOB: 1951-01-12, PCP Valora Agent, MD   Past Medical History:  Diagnosis Date   Arrhythmia    atrial fibrillation   CHF (congestive heart failure) (HCC)    Chronic kidney disease    Coronary artery disease    Hyperlipidemia    Hypertension    Myocardial infarct [facility])    Past Surgical History:  Procedure Laterality Date   CARDIAC DEFIBRILLATOR PLACEMENT  feb 2014   INSERTION OF IMPLANTABLE LEFT VENTRICULAR ASSIST DEVICE N/A 09/17/2021   Procedure: INSERTION OF IMPLANTABLE LEFT VENTRICULAR ASSIST DEVICE AND INSERTION OF FEMORAL ARTERIAL LINE;  Surgeon: Lucas Dorise POUR, MD;  Location: [facility] OR;  Service: Open Heart Surgery;  Laterality: N/A;   IR FLUORO GUIDE CV LINE RIGHT  09/27/2021   IR THORACENTESIS ASP PLEURAL SPACE W/IMG GUIDE  09/27/2021   IR US  GUIDE VASC ACCESS RIGHT  09/27/2021   PLACEMENT OF IMPELLA LEFT VENTRICULAR ASSIST DEVICE N/A 09/12/2021   Procedure: PLACEMENT OF IMPELLA 5.5 LEFT VENTRICULAR ASSIST DEVICE;  Surgeon: Kerrin Elspeth BROCKS, MD;  [facility];  Service: Open Heart Surgery;  Laterality: N/A;   RIGHT HEART CATH N/A 09/08/2021   Procedure: RIGHT HEART CATH;  Surgeon: Rolan Ezra RAMAN, MD;  Location: [facility] INVASIVE CV LAB;  Service: Cardiovascular;  Laterality: N/A;   TEE WITHOUT CARDIOVERSION N/A 09/12/2021   Procedure: TRANSESOPHAGEAL ECHOCARDIOGRAM (TEE);  Surgeon: Kerrin Elspeth BROCKS, MD;  Location: [facility] OR;  Service: Open Heart Surgery;  Laterality: N/A;   TEE WITHOUT CARDIOVERSION N/A 09/17/2021   Procedure: TRANSESOPHAGEAL ECHOCARDIOGRAM (TEE);  Surgeon: Lucas Dorise POUR, MD;  Location: [facility] OR;  Service: Open Heart Surgery;  Laterality: N/A;   Social History   Socioeconomic History   Marital status: Married    Spouse name: Not on file   Number of children: Not on file   Years of education: Not  on file   Highest education level: Not on file  Occupational History   Not on file  Tobacco Use   Smoking status: Former   Smokeless tobacco: Never  Vaping Use   Vaping status: Never Used  Substance and Sexual Activity   Alcohol use: No   Drug use: No   Sexual activity: Not on file  Other Topics Concern   Not on file  Social History Narrative   Not on file   Social Drivers of Health   Financial Resource Strain: Not on file  Food Insecurity: Not on file  Transportation Needs: Not on file  Physical Activity: Not on file  Stress: Not on file  Social Connections: Not on file   Family History  Problem Relation Age of Onset   Hypertension Mother    Hypertension Father    Hyperlipidemia Father    Heart attack Father    Heart failure Father    Outpatient Encounter Medications as of 10/26/2023  Medication Sig   ondansetron  (ZOFRAN ) 4 MG tablet Take 4 mg by mouth every 8 (eight) hours as needed for nausea or vomiting.   pantoprazole  (PROTONIX ) 40 MG tablet TAKE 1 TABLET BY MOUTH EVERY DAY (Patient taking differently: Take 40  mg by mouth daily before breakfast.)   furosemide  (LASIX ) 20 MG tablet Take 3 tablets (60 mg total) by mouth daily.   rosuvastatin  (CRESTOR ) 10 MG tablet TAKE 1 TABLET BY MOUTH EVERY DAY   sildenafil  (REVATIO ) 20 MG tablet Take 1 tablet (20 mg total) by mouth 3 (three) times daily.   warfarin (COUMADIN ) 2 MG tablet Take 1 tablet (2 mg total) by mouth daily at 4 PM.   [DISCONTINUED] albuterol  (VENTOLIN  HFA) 108 (90 Base) MCG/ACT inhaler Inhale 2 puffs into the lungs every 6 (six) hours as needed. (Patient not taking: Reported on 06/18/2023)   [DISCONTINUED] fluticasone  (FLONASE ) 50 MCG/ACT nasal spray Place 1 spray into both nostrils daily. (Patient not taking: Reported on 08/30/2023)   [DISCONTINUED] metoCLOPramide  (REGLAN ) 5 MG tablet Take 1 tablet (5 mg total) by mouth 4 (four) times daily. (Patient not taking: Reported on 06/17/2023)   [DISCONTINUED] potassium  chloride SA (KLOR-CON  M) 20 MEQ tablet Take 2 tablets (40 mEq total) by mouth once for 1 dose. (Patient not taking: Reported on 08/30/2023)   [DISCONTINUED] predniSONE  (DELTASONE ) 5 MG tablet Take 1 tablet (5 mg total) by mouth daily with breakfast. (Patient not taking: Reported on 08/30/2023)   No facility-administered encounter medications on file as of 10/26/2023.   ALLERGIES: Allergies  Allergen Reactions   Mushroom Extract Complex (Do Not Select) Nausea And Vomiting   Doxycycline  Nausea And Vomiting   Neosporin [Neomycin-Bacitracin Zn-Polymyx] Hives   Tape Other (See Comments)    Some tapes/dressings can irritate the skin    VACCINATION STATUS:  There is no immunization history on file for this patient.  HPI Isaac [name] is 73 y.o. male who presents today with a medical history as above. he is being seen in follow-up after he was seen consultation for abnormal thyroid  function tests requested by Valora Agent, MD.  He is accompanied by his wife to clinic.  Patient with multiple medical problems including coronary artery disease, CVA, CKD, CHF, tachyarrhythmia.  She was recently found to have fluctuating thyroid  function tests including times of high TSH and most recently elevated free T4.   He is several weeks off of amiodarone  at this time. He is prior thyroid  uptake and scan was consistent with low uptake of 1.5% in 24 hours. She was given a brief course of prednisone  to treat thyroiditis. He presents with normal TSH, normal free T3, however persistent slight elevation of free T4. He currently denies palpitations, heat intolerance.  He presents with mildly fluctuating body weight.    He has CHF with recent ejection fraction of 25%, in December 2022 underwent HM 3 LVAD placement. He does not exert much at baseline. He denies dysphagia, shortness of breath, nor voice change.  He denies family history of thyroid  dysfunction or thyroid  malignancy. He was not put on any thyroid   hormone nor antithyroid medications so far.  He is a former smoker.  He is also known to have hyperlipidemia and hypertension.    Review of Systems  Constitutional: + Recent weight gain,  +fatigue, no subjective hyperthermia, no subjective hypothermia Eyes: no blurry vision, no xerophthalmia ENT: no sore throat, no nodules palpated in throat, no dysphagia/odynophagia, no hoarseness   Objective:       10/26/2023    1:55 PM 08/30/2023    4:02 PM 08/30/2023    4:01 PM  Vitals with BMI  Height 5' 9    Weight 189 lbs 10 oz  190 lbs 10 oz  BMI 27.99  Systolic  102 97  Diastolic  0 86  Pulse   88    Ht 5' 9 (1.753 m)   Wt 189 lb 9.6 oz (86 kg)   BMI 28.00 kg/m   Wt Readings from Last 3 Encounters:  10/26/23 189 lb 9.6 oz (86 kg)  08/30/23 190 lb 9.6 oz (86.5 kg)  06/22/23 194 lb 6.4 oz (88.2 kg)    Physical Exam  Constitutional:  Body mass index is 28 kg/m.,  not in acute distress, normal state of mind Eyes: PERRLA, EOMI, no exophthalmos ENT: moist mucous membranes, no gross thyromegaly, no gross cervical lymphadenopathy   CMP ( most recent) CMP     Component Value Date/Time   NA 138 09/08/2023 1134   NA 141 07/06/2014 1425   K 4.0 09/08/2023 1134   K 3.6 07/06/2014 1425   CL 109 09/08/2023 1134   CL 110 (H) 07/06/2014 1425   CO2 24 09/08/2023 1134   CO2 30 07/06/2014 1425   GLUCOSE 94 09/08/2023 1134   GLUCOSE 93 07/06/2014 1425   BUN 24 (H) 09/08/2023 1134   BUN 15 07/06/2014 1425   CREATININE 2.37 (H) 09/08/2023 1134   CREATININE 1.45 (H) 07/06/2014 1425   CALCIUM  8.7 (L) 09/08/2023 1134   CALCIUM  8.5 07/06/2014 1425   PROT 7.0 08/30/2023 1301   ALBUMIN  3.4 (L) 08/30/2023 1301   AST 22 08/30/2023 1301   ALT 18 08/30/2023 1301   ALKPHOS 131 (H) 08/30/2023 1301   BILITOT 1.3 (H) 08/30/2023 1301   GFRNONAA 29 (L) 09/08/2023 1134   GFRNONAA 52 (L) 07/06/2014 1425     Diabetic Labs (most recent): Lab Results  Component Value Date   HGBA1C 6.1 (H)  08/23/2021     Lipid Panel ( most recent) Lipid Panel     Component Value Date/Time   CHOL 94 09/08/2021 0141   TRIG 54 09/08/2021 0141   HDL 25 (L) 09/08/2021 0141   CHOLHDL 3.8 09/08/2021 0141   VLDL 11 09/08/2021 0141   LDLCALC 58 09/08/2021 0141      Lab Results  Component Value Date   TSH 3.730 10/25/2023   TSH 5.884 (H) 08/30/2023   TSH 4.920 (H) 06/18/2023   TSH 4.095 02/11/2023   TSH 4.992 (H) 12/09/2022   FREET4 2.11 (H) 10/25/2023   FREET4 2.29 (H) 06/18/2023   FREET4 1.77 (H) 02/11/2023   FREET4 1.48 (H) 12/09/2022   FREET4 1.54 (H) 10/07/2022         Thyroid  uptake and scan on April 13, 2023 FINDINGS: Planar imaging of the thyroid  was obtained in multiple projections. There is limited visualization of the thyroid  gland due to minimal uptake of radiotracer by the thyroid  parenchyma.   4 hour I-123 uptake = 1.0% (normal 5-20%)   24 hour I-123 uptake = 1.5% (normal 10-30%)   IMPRESSION: 1. Markedly diminished iodine uptake values at 4 hours and 24 hours. Evaluation of the thyroid  gland is limited due to decreased parenchymal uptake of radiotracer.   Assessment & Plan:   1. Abnormal thyroid  blood test    - Isaac [name]  is being seen at a kind request of Valora Agent, MD. - I have reviewed his  new and available thyroid  records and clinically evaluated the patient. 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 He was taken off of amiodarone  several weeks ago.  He was given a brief course of low-dose prednisone  5 mg for  15 days to which she has responded.  Physical exam today is unremarkable.  Based on his elevated free T4 indicative of a possibility of primary hyperthyroidism, he was offered repeat thyroid  uptake and scan to help with treatment decisions. However, the family decided to postpone this study until he has repeat thyroid  function test.  This is considered reasonable,  ordered thyroid  function tests and office visit in 5 weeks.  Alpha subunit which was previously ordered was never measured.   - he is advised to maintain close follow up with Valora Agent, MD for primary care needs.   I spent  20  minutes in the care of the patient today including review of labs from Thyroid  Function, CMP, and other relevant labs ; imaging/biopsy records (current and previous including abstractions from other facilities); face-to-face time discussing  his lab results and symptoms, medications doses, his options of short and long term treatment based on the latest standards of care / guidelines;   and documenting the encounter.  Isaac [name]  participated in the discussions, expressed understanding, and voiced agreement with the above plans.  All questions were answered to his satisfaction. he is encouraged to contact clinic should he have any questions or concerns prior to his return visit.   Follow up plan: Return in about 5 weeks (around 11/30/2023) for F/U with Pre-visit Labs.   Isaac Earl, MD [facility] [address] [place], [place] [address] Phone: [phone]  Fax: [phone]     10/26/2023, 5:17 PM  This note was partially dictated with voice recognition software. Similar sounding words can be transcribed inadequately or may not  be corrected upon review.

## 2023-10-28 ENCOUNTER — Ambulatory Visit (HOSPITAL_COMMUNITY): Payer: Self-pay | Admitting: Pharmacist

## 2023-10-28 LAB — POCT INR: INR: 1.7 — AB (ref 2.0–3.0)

## 2023-11-01 ENCOUNTER — Other Ambulatory Visit (HOSPITAL_COMMUNITY): Payer: Self-pay | Admitting: *Deleted

## 2023-11-01 DIAGNOSIS — Z7901 Long term (current) use of anticoagulants: Secondary | ICD-10-CM

## 2023-11-01 DIAGNOSIS — Z95811 Presence of heart assist device: Secondary | ICD-10-CM

## 2023-11-01 DIAGNOSIS — I5022 Chronic systolic (congestive) heart failure: Secondary | ICD-10-CM

## 2023-11-02 ENCOUNTER — Ambulatory Visit (HOSPITAL_COMMUNITY)
Admission: RE | Admit: 2023-11-02 | Discharge: 2023-11-02 | Disposition: A | Discharge status: Home / Self Care | Payer: Medicare HMO | Source: Ambulatory Visit | Attending: Cardiology | Admitting: Cardiology

## 2023-11-02 ENCOUNTER — Ambulatory Visit (HOSPITAL_COMMUNITY): Payer: Self-pay | Admitting: Pharmacist

## 2023-11-02 DIAGNOSIS — Z79899 Other long term (current) drug therapy: Secondary | ICD-10-CM | POA: Insufficient documentation

## 2023-11-02 DIAGNOSIS — I429 Cardiomyopathy, unspecified: Secondary | ICD-10-CM | POA: Insufficient documentation

## 2023-11-02 DIAGNOSIS — I5022 Chronic systolic (congestive) heart failure: Secondary | ICD-10-CM | POA: Diagnosis not present

## 2023-11-02 DIAGNOSIS — I480 Paroxysmal atrial fibrillation: Secondary | ICD-10-CM | POA: Insufficient documentation

## 2023-11-02 DIAGNOSIS — I2510 Atherosclerotic heart disease of native coronary artery without angina pectoris: Secondary | ICD-10-CM | POA: Diagnosis not present

## 2023-11-02 DIAGNOSIS — I071 Rheumatic tricuspid insufficiency: Secondary | ICD-10-CM | POA: Diagnosis not present

## 2023-11-02 DIAGNOSIS — R946 Abnormal results of thyroid function studies: Secondary | ICD-10-CM | POA: Insufficient documentation

## 2023-11-02 DIAGNOSIS — Z955 Presence of coronary angioplasty implant and graft: Secondary | ICD-10-CM | POA: Insufficient documentation

## 2023-11-02 DIAGNOSIS — Z7901 Long term (current) use of anticoagulants: Secondary | ICD-10-CM | POA: Insufficient documentation

## 2023-11-02 DIAGNOSIS — Z95811 Presence of heart assist device: Secondary | ICD-10-CM | POA: Diagnosis present

## 2023-11-02 DIAGNOSIS — Z4502 Encounter for adjustment and management of automatic implantable cardiac defibrillator: Secondary | ICD-10-CM | POA: Insufficient documentation

## 2023-11-02 DIAGNOSIS — I482 Chronic atrial fibrillation, unspecified: Secondary | ICD-10-CM | POA: Insufficient documentation

## 2023-11-02 DIAGNOSIS — N184 Chronic kidney disease, stage 4 (severe): Secondary | ICD-10-CM | POA: Diagnosis not present

## 2023-11-02 DIAGNOSIS — I130 Hypertensive heart and chronic kidney disease with heart failure and stage 1 through stage 4 chronic kidney disease, or unspecified chronic kidney disease: Secondary | ICD-10-CM | POA: Insufficient documentation

## 2023-11-02 LAB — BASIC METABOLIC PANEL
Anion gap: 7 (ref 5–15)
BUN: 28 mg/dL — ABNORMAL HIGH (ref 8–23)
CO2: 25 mmol/L (ref 22–32)
Calcium: 8.9 mg/dL (ref 8.9–10.3)
Chloride: 106 mmol/L (ref 98–111)
Creatinine, Ser: 2.48 mg/dL — ABNORMAL HIGH (ref 0.61–1.24)
GFR, Estimated: 27 mL/min — ABNORMAL LOW (ref >60)
Glucose, Bld: 77 mg/dL (ref 70–99)
Potassium: 4.4 mmol/L (ref 3.5–5.1)
Sodium: 138 mmol/L (ref 135–145)

## 2023-11-02 LAB — CBC
HCT: 41.8 % (ref 39.0–52.0)
Hemoglobin: 13.4 g/dL (ref 13.0–17.0)
MCH: 29.9 pg (ref 26.0–34.0)
MCHC: 32.1 g/dL (ref 30.0–36.0)
MCV: 93.3 fL (ref 80.0–100.0)
Platelets: 228 10*3/uL (ref 150–400)
RBC: 4.48 MIL/uL (ref 4.22–5.81)
RDW: 16.4 % — ABNORMAL HIGH (ref 11.5–15.5)
WBC: 4.5 10*3/uL (ref 4.0–10.5)
nRBC: 0 % (ref 0.0–0.2)

## 2023-11-02 LAB — LACTATE DEHYDROGENASE: LDH: 165 U/L (ref 98–192)

## 2023-11-02 LAB — PROTIME-INR
INR: 1.7 — ABNORMAL HIGH (ref 0.8–1.2)
Prothrombin Time: 20.3 s — ABNORMAL HIGH (ref 11.4–15.2)

## 2023-11-02 MED ORDER — WARFARIN SODIUM 2 MG PO TABS: ORAL_TABLET | ORAL | 11 refills | Status: DC

## 2023-11-02 NOTE — Patient Instructions (Addendum)
No change in medications Lauren will call you with Coumadin dosing Return to clinic in 2 months Closest VAD center to [facility], [place]  [facility] [phone]  [address], [place], [place], [address] [facility] [phone]   [address], [place], [place], [address] [place]

## 2023-11-02 NOTE — Progress Notes (Addendum)
Patient presents for 2 month f/u in VAD Clinic alone. Denies issues/concerns with VAD equipment or drive line.    Patient ambulated in to clinic independently without difficulty. Denies lightheadedness, dizziness, falls, shortness of breath, and signs of bleeding. States he is remaining active through daily walks with his wife and at the [place].  Lasix decreased to 60 mg daily last visit.   Pt followed by Dr. Fransico Him with [facility] Endocrinology. Pt was seen February. Thyroid recheck in 5 weeks.  ICD battery at [facility]. Discussed again with Dr Shirlee Latch. Will not plan to change generator at this time.  Vital Signs:  Doppler Pressure: 92 Automatic BP: 99/55 (68) HR: 63 SPO2: 98   Weight: 186.4 lb w/ eqt Last weight: 190.6 lb w/eqt   VAD Indication: Destination Therapy due to age and kidney disease   LVAD assessment:  HM III: Speed: 5300 rpms Flow: 4.4 Power: 3.9 w    PI: 3.5 Alarms: none Events: rare   Fixed speed: 5300 Low speed limit: 5000  Primary controller: back up battery due for replacement in 13 months Secondary controller:  back up battery due for replacement in 8 months   I reviewed the LVAD parameters from today and compared the results to the patient's prior recorded data. LVAD interrogation was NEGATIVE for significant power changes, NEGATIVE for clinical alarms and STABLE for PI events/speed drops. No programming changes were made and pump is functioning within specified parameters. Pt is performing daily controller and system monitor self tests along with completing weekly and monthly maintenance for LVAD equipment.   LVAD equipment check completed and is in good working order. Back-up equipment present. Charged back up battery and performed self-test on equipment.    Annual Equipment Maintenance on UBC/PM was performed on today 08/30/23.     Exit Site Care: Wife has been performing weekly dressing changes using weekly dressings. Drive line anchor correctly applied.  Pt given  8 weekly kits, betadine, 2 x 2s and 8 weekly kits.  Device:  [employer] dual  Therapies: on Pacing: VVI 50 Last check: 06/14/23   BP & Labs:  Doppler 92 - Doppler is reflecting Modified systolic   Hgb 13.4 - No S/S of bleeding. Specifically denies melena/BRBPR or nosebleeds.   LDH stable at 165 with established baseline of 200 - 300. Denies tea-colored urine. No power elevations noted on interrogation.   Patient Instructions:  No change in medications Lauren will call you with Coumadin dosing Return to clinic in 2 months Closest VAD center to [place], [place] and [place], [facility]  [facility] [phone] [address] [place], [place], [address] [facility] [phone] [address] [place], [place], [address] [place]   Carlton Adam RN,BSN VAD Coordinator  Office: [phone]  24/7 Pager: [phone]            Cardiology: Dr. Shirlee Latch PCP: Jerl Mina, MD  Follow up for Heart Failure/LVAD: 73 y.o. with history of CAD, HTN, atrial fibrillation, and chronic systolic CHF returns for followup of CHF/LVAD.   Patient has had a long-standing cardiomyopathy, EF had been in the 25% range for years.  He had PCI to OM1 in 2007 and RCA in 2013.  From his report, these episodes do not sound like ACS events.  Echo in 12/22 showed EF < 20%, severe LV dilation, restrictive diastolic function, moderate RV dysfunction, moderate MR, mod-severe TR. HF was been complicated by CKD stage 3. He was also noted on device interrogation to have  been in atrial fibrillation persistently since 10/22.  He was admitted at [facility] in 12/22.  BP was low, his BP-active meds were stopped and midodrine was begun.  He was diuresed and discharged home.    He reports ongoing severe dyspnea and was admitted again in 12/22 after he stood up then passed out at home.  Device interrogation showed run of VT treated x 2 with ATP possibly around the time of  syncope.  RHC in 12/22 showed low output HF and he developed cardiogenic shock. Impella 5.5 was placed.  On 09/17/21, HM3 LVAD was placed, ICD leads were plastered to the tricuspid valve with severe TR, the valve was not replaced.  The patient had significant interoperative hypotension.  Post-operatively, he developed progressive renal failure and was started on CVVH then iHD.  He had a bumpy post-op course with renal failure and RV failure, but was ultimately able to discharge home on 10/24/21.   Ramp echo in 3/23, RV mildly dilated with normal systolic function, severe TR, LV EF 20-25%, aortic valve opens every beat.  Speed increased to 5300 rpm. The IV septum was mildly leftwards at 5300 rpm but flow increased and AoV still opened every beat.   Patient was admitted in 3/24 with left groin abscess.  This spontaneously drained and was treated with IV antibiotics.  He was sent home on linezolid and Augmentin.  He developed profuse diarrhea that he attributed to linezolid.  Linezolid was stopped and he was continued on Augmentin.   Echo in 7/24 showed EF < 20%, IV septum midline to slightly leftward, RV moderately dilated with moderately decreased systolic function, IVC dilated with severe TR.  Speed not changed.   Patient returns for LVAD followup.  Weight down 4 lbs.  MAP mildly low today at 68 but he denies lightheadedness.  Feels good overall, dyspnea only walking long distances or up steep hills.  No chest pain.  No orthopnea/PND. Working out at [employer] and walking for exercise. No driveline drainage.   Denies LVAD alarms.  Denies driveline trauma, erythema or drainage.  Denies ICD shocks.   Reports taking Coumadin as prescribed and adherence to anticoagulation based dietary restrictions.  Denies bright red blood per rectum or melena, no dark urine or hematuria.    Labs (3/23): LFTs normal, TSH mildly elevated Labs (7/23): hgb 12.1 Labs (8/23): hgb 11.8 => 9.9, K 4.4, creatinine 2.5 => 3.09, LFTs  normal.  Labs (11/23): K 4.5, creatinine 2.95 Labs (1/24): TSH 5.2 Labs (3/24): hgb 10.7, LDH 157, LFTs normal, creatinine 2.75 Labs (5/24): K 3.6, creatinine 2.7, LFTs normal, TSH normal Labs (7/24): hgb 11.7, LDH 172, K 3.5, creatinine 2.33 Labs (10/24): K 3.4, creatinine 2.24 Labs (12/24): K 4, creatinine 2.37 Labs (2/25): TSH normal  PMH: 1. HTN 2. Hyperlipidemia 3. CAD: H/o PCI to OM1 2007 and PCI to RCA in 2013.  Uncertain of circumstances but do not sound like ACS events.  4. CKD stage IV: Patient was on HD for several months post-LVAD but was able to come off.  5. Atrial fibrillation: Paroxysmal 6. Chronic systolic CHF: Long-standing cardiomyopathy with EF in the 25% range.  Cause uncertain, has history of PCI to RCA and OM1, but degree of CAD does not seem to explain severity of cardiomyopathy. Father had history of CAD/CHF.  Not heavy drinker. Has [employer] ICD.  - Echo (12/22): EF < 20%, severe LV dilation, restrictive diastolic function, moderate RV dysfunction, moderate MR, mod-severe TR.  - 12/22 cardiogenic shock  with Impella 5.5 placement then Heartmate 3 LVAD.  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  - Echo (7/24): EF < 20%, IV septum midline to slightly leftward, RV moderately dilated with moderately decreased systolic function, IVC dilated with severe TR. 7. VT - Nausea with amiodarone.  8. RV failure/severe TR 9. Citrobacter UTI (8/23) 10. Left groin abscess 3/24   Current Outpatient Medications  Medication Sig Dispense Refill   furosemide (LASIX) 20 MG tablet Take 3 tablets (60 mg total) by mouth daily. 90 tablet 3   ondansetron (ZOFRAN) 4 MG tablet Take 4 mg by mouth every 8 (eight) hours as needed for nausea or vomiting.     pantoprazole (PROTONIX) 40 MG tablet TAKE 1 TABLET BY MOUTH EVERY DAY (Patient taking differently: Take 40 mg by mouth daily before breakfast.) 90 tablet 3   rosuvastatin (CRESTOR) 10 MG tablet  TAKE 1 TABLET BY MOUTH EVERY DAY 90 tablet 3   sildenafil (REVATIO) 20 MG tablet Take 1 tablet (20 mg total) by mouth 3 (three) times daily. 90 tablet 11   warfarin (COUMADIN) 2 MG tablet Instructed to take 4 mg (2 tabs) every Monday and 2 mg (1 tab) all other days or as directed by HF Clinic 50 tablet 11   No current facility-administered medications for this encounter.    Mushroom extract complex (obsolete), Doxycycline, Neosporin [neomycin-bacitracin zn-polymyx], and Tape  REVIEW OF SYSTEMS: All systems negative except as listed in HPI, PMH and Problem list.  LVAD INTERROGATION:  See LVAD nurse's note above.  I reviewed LVAD parameters.   I reviewed the LVAD parameters from today, and compared the results to the patient's prior recorded data.  No programming changes were made.  The LVAD is functioning within specified parameters.  The patient performs LVAD self-test daily.  LVAD interrogation was negative for any significant power changes, alarms or PI events/speed drops.  LVAD equipment check completed and is in good working order.  Back-up equipment present.   LVAD education done on emergency procedures and precautions and reviewed exit site care.    Vitals:   11/02/23 1108 11/02/23 1121  BP: (!) 99/55 (!) 92/0  Pulse: 63   SpO2: 98%   Weight: 84.6 kg (186 lb 6.4 oz)   MAP 68  Physical Exam: General: Well appearing this am. NAD.  HEENT: Normal. Neck: Supple, JVP 8-9 cm. Carotids OK.  Cardiac:  Mechanical heart sounds with LVAD hum present.  Lungs:  CTAB, normal effort.  Abdomen:  NT, ND, no HSM. No bruits or masses. +BS  LVAD exit site: Well-healed and incorporated. Dressing dry and intact. No erythema or drainage. Stabilization device present and accurately applied. Driveline dressing changed daily per sterile technique. Extremities:  Warm and dry. No cyanosis, clubbing, rash, or edema.  Neuro:  Alert & oriented x 3. Cranial nerves grossly intact. Moves all 4 extremities w/o  difficulty. Affect pleasant    ASSESSMENT AND PLAN: 1. Chronic systolic CHF:  Long-standing cardiomyopathy.  [employer] ICD.  Echo in 12/22 with EF < 20%, severe LV dilation, restrictive diastolic function, moderate RV dysfunction, moderate MR, mod-severe TR. Cause of cardiomyopathy is uncertain.  He has a history of CAD, but I do not think that the described CAD from the past could explain his cardiomyopathy, but CAD could have progressed.  He was admitted in 12/22 with cardiogenic shock and Impella 5.5. s/p HM III VAD on 09/17/22.  Post-op course complicated by AKI and RV failure.  He was on HD for several months but is now off.  Echo in 7/24 showed EF < 20%, IV septum midline to slightly leftward, RV moderately dilated with moderately decreased systolic function, IVC dilated with severe TR.  NYHA class II symptoms, mild JVD in setting of severe TR.    - Continue Lasix 60 mg daily, check BMET today.   - Continue sildenafil 20 tid for RV.   - Warfarin for INR 2-2.5.   - His [employer] device is at [employer].  He has never been shocked.  We have discussed generator replacement vs no replacement.  He is inclined to avoid further procedures, so I told him that I thought it would be reasonable not to replace generator.  2. Tricuspid regurgitation:  Tricuspid repair not done at time of VAD due to proximity of ICD wires and hypotension during surgery.  He has severe TR.  3. Atrial fibrillation: Chronic at this point.  - Atrial fibrillation now seems chronic, he is off amiodarone with resolution of nausea.  I will not restart it.  4. CKD stage IV: Suspect intra-op hypotension at the time of LVAD placement led to development of ATN => urine sediment looked like ATN per renal. Had initial CVVH and transitioned over to HD. He is now off HD with creatinine stable, most recently 2.37.  - BMET today.  - Follows with nephrology.  5. CAD: History of PCI to OM1 in 2007 and RCA in 2013.  No CP or ACs.  -  Deferred cardiac cath in 12/22 due to AKI and plan for VAD - Continue Crestor.   6. Abnormal thyroid function tests: Patient has had elevated TSH and free T4, mixed pattern. We have stopped amiodarone. 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  Most recent TSH was normal.  - He follows with endocrinology.   7. Nausea: This seems to have been due to amiodarone, resolved off amiodarone.   Followup in 2 months.   I spent 41 minutes reviewing records, interviewing/examining patient, and managing orders.    Marca Ancona 11/02/2023

## 2023-11-04 ENCOUNTER — Ambulatory Visit (HOSPITAL_COMMUNITY): Payer: Self-pay | Admitting: Pharmacist

## 2023-11-04 LAB — POCT INR: INR: 2 (ref 2.0–3.0)

## 2023-11-11 ENCOUNTER — Ambulatory Visit (HOSPITAL_COMMUNITY): Payer: Self-pay | Admitting: Pharmacist

## 2023-11-11 LAB — POCT INR: INR: 2 (ref 2.0–3.0)

## 2023-11-19 ENCOUNTER — Ambulatory Visit (HOSPITAL_COMMUNITY): Payer: Self-pay | Admitting: Pharmacist

## 2023-11-19 LAB — POCT INR: INR: 1.9 — AB (ref 2.0–3.0)

## 2023-11-24 ENCOUNTER — Ambulatory Visit (HOSPITAL_COMMUNITY): Payer: Self-pay | Admitting: Pharmacist

## 2023-11-24 ENCOUNTER — Other Ambulatory Visit (HOSPITAL_COMMUNITY): Payer: Self-pay | Admitting: *Deleted

## 2023-11-24 ENCOUNTER — Ambulatory Visit (HOSPITAL_COMMUNITY)
Admission: RE | Admit: 2023-11-24 | Discharge: 2023-11-24 | Disposition: A | Discharge status: Home / Self Care | Source: Ambulatory Visit | Attending: Internal Medicine | Admitting: Internal Medicine

## 2023-11-24 DIAGNOSIS — Z7901 Long term (current) use of anticoagulants: Secondary | ICD-10-CM

## 2023-11-24 DIAGNOSIS — I5022 Chronic systolic (congestive) heart failure: Secondary | ICD-10-CM | POA: Diagnosis present

## 2023-11-24 DIAGNOSIS — Z95811 Presence of heart assist device: Secondary | ICD-10-CM | POA: Insufficient documentation

## 2023-11-24 LAB — CBC
HCT: 38.6 % — ABNORMAL LOW (ref 39.0–52.0)
Hemoglobin: 12.5 g/dL — ABNORMAL LOW (ref 13.0–17.0)
MCH: 29.9 pg (ref 26.0–34.0)
MCHC: 32.4 g/dL (ref 30.0–36.0)
MCV: 92.3 fL (ref 80.0–100.0)
Platelets: 187 10*3/uL (ref 150–400)
RBC: 4.18 MIL/uL — ABNORMAL LOW (ref 4.22–5.81)
RDW: 15.8 % — ABNORMAL HIGH (ref 11.5–15.5)
WBC: 4.2 10*3/uL (ref 4.0–10.5)
nRBC: 0 % (ref 0.0–0.2)

## 2023-11-24 LAB — BASIC METABOLIC PANEL
Anion gap: 10 (ref 5–15)
BUN: 33 mg/dL — ABNORMAL HIGH (ref 8–23)
CO2: 25 mmol/L (ref 22–32)
Calcium: 9.5 mg/dL (ref 8.9–10.3)
Chloride: 105 mmol/L (ref 98–111)
Creatinine, Ser: 2.57 mg/dL — ABNORMAL HIGH (ref 0.61–1.24)
GFR, Estimated: 26 mL/min — ABNORMAL LOW (ref >60)
Glucose, Bld: 86 mg/dL (ref 70–99)
Potassium: 4.2 mmol/L (ref 3.5–5.1)
Sodium: 140 mmol/L (ref 135–145)

## 2023-11-24 LAB — LACTATE DEHYDROGENASE: LDH: 156 U/L (ref 98–192)

## 2023-11-24 LAB — PROTIME-INR
INR: 1.7 — ABNORMAL HIGH (ref 0.8–1.2)
Prothrombin Time: 20.1 s — ABNORMAL HIGH (ref 11.4–15.2)

## 2023-11-24 NOTE — Progress Notes (Signed)
 Patient presents for BP check in VAD Clinic alone. Denies issues/concerns with VAD equipment or drive line.    Patient ambulated in to clinic independently without difficulty. States he is having new onset profound dizziness in the morning when first waking up and getting out of bed. He states this resolves quickly and does not return until the next morning upon waking.  Pt denies falls, shortness of breath, and signs of bleeding. States he is taking all medications as prescribed and continues to drink approximately 32 oz of water daily. Orthostatics taken at today's visit. See below. Pt denies dizziness during position changes.  Pt states he is taking his night time medications around 11:30pm which includes Sildenafil. Dr. Shirlee Latch advised to move the timing of his evening medications up to earlier and drink water or electrolyte drink prior to standing in the morning. Pt advised to continue to stay hydrated.  Labs obtained at today's visit.  Vital Signs:    BP HR Speed Flow Power  PI  Lying 103/83 (89) 63 5300 4.7 3.9w 3.0  Sitting 106/92 (99) 67 5300 4.7 4.0w 3.0  Standing 100/87 (92) 92 5300 4.7 4.7w 3.1   Doppler Pressure: 85 Automatic BP: 103/83 (89) HR: 63 NSR SPO2: 98   Weight: 188.2 lb w/ eqt Last weight: 186.4 lb w/eqt   VAD Indication: Destination Therapy due to age and kidney disease   LVAD assessment:  HM III: Speed: 5300 rpms Flow: 4.7 Power: 3.9 w    PI: 3.0 Alarms: none Events: rare   Fixed speed: 5300 Low speed limit: 5000  Primary controller: back up battery due for replacement in 13 months Secondary controller:  back up battery due for replacement in 8 months   I reviewed the LVAD parameters from today and compared the results to the patient's prior recorded data. LVAD interrogation was NEGATIVE for significant power changes, NEGATIVE for clinical alarms and STABLE for PI events/speed drops. No programming changes were made and pump is functioning within  specified parameters. Pt is performing daily controller and system monitor self tests along with completing weekly and monthly maintenance for LVAD equipment.   LVAD equipment check completed and is in good working order. Back-up equipment present. Charged back up battery and performed self-test on equipment.    Annual Equipment Maintenance on UBC/PM was performed on today 08/30/23.     Exit Site Care: Wife has been performing weekly dressing changes using weekly dressings. Drive line anchor correctly applied.   Device:  [employer] dual  Therapies: on Pacing: VVI 50 Last check: 06/14/23   BP & Labs:  Doppler 100 - Doppler is reflecting Modified systolic   Hgb 12.5 - No S/S of bleeding. Specifically denies melena/BRBPR or nosebleeds.   LDH stable at 156 with established baseline of 200 - 300. Denies tea-colored urine. No power elevations noted on interrogation.   Patient Instructions:  No change in medications Lauren will call you with Coumadin dosing Return to clinic for 2 month f/u with Dr.McLean Closest VAD center to [facility], [place]  [facility] [phone] [address] [place], [place], [address] [place]  [place] RN, BSN VAD Coordinator 24/7 Pager [phone]

## 2023-11-26 ENCOUNTER — Other Ambulatory Visit (HOSPITAL_COMMUNITY): Payer: Self-pay | Admitting: Cardiology

## 2023-12-01 ENCOUNTER — Ambulatory Visit: Payer: Medicare HMO | Admitting: "Endocrinology

## 2023-12-02 ENCOUNTER — Ambulatory Visit (HOSPITAL_COMMUNITY): Payer: Self-pay | Admitting: Pharmacist

## 2023-12-02 LAB — POCT INR: INR: 2.2 (ref 2.0–3.0)

## 2023-12-10 ENCOUNTER — Ambulatory Visit (HOSPITAL_COMMUNITY): Payer: Self-pay | Admitting: Pharmacist

## 2023-12-10 LAB — POCT INR: INR: 1.5 — AB (ref 2.0–3.0)

## 2023-12-17 LAB — POCT INR: INR: 1.8 — AB (ref 2.0–3.0)

## 2023-12-20 ENCOUNTER — Ambulatory Visit (HOSPITAL_COMMUNITY): Payer: Self-pay | Admitting: Pharmacist

## 2023-12-23 ENCOUNTER — Ambulatory Visit (HOSPITAL_COMMUNITY): Payer: Self-pay | Admitting: Pharmacist

## 2023-12-23 LAB — POCT INR: INR: 1.7 — AB (ref 2.0–3.0)

## 2023-12-27 ENCOUNTER — Ambulatory Visit: Admitting: "Endocrinology

## 2023-12-30 ENCOUNTER — Ambulatory Visit (HOSPITAL_COMMUNITY): Payer: Self-pay | Admitting: Pharmacist

## 2023-12-30 LAB — POCT INR: INR: 2 (ref 2.0–3.0)

## 2024-01-03 ENCOUNTER — Encounter (HOSPITAL_COMMUNITY): Payer: Medicare HMO | Admitting: Cardiology

## 2024-01-07 ENCOUNTER — Other Ambulatory Visit (HOSPITAL_COMMUNITY): Payer: Self-pay

## 2024-01-07 ENCOUNTER — Ambulatory Visit (HOSPITAL_COMMUNITY): Payer: Self-pay | Admitting: Pharmacist

## 2024-01-07 DIAGNOSIS — Z7901 Long term (current) use of anticoagulants: Secondary | ICD-10-CM

## 2024-01-07 DIAGNOSIS — Z95811 Presence of heart assist device: Secondary | ICD-10-CM

## 2024-01-07 LAB — POCT INR: INR: 2.3 (ref 2.0–3.0)

## 2024-01-10 ENCOUNTER — Telehealth (HOSPITAL_COMMUNITY): Payer: Self-pay

## 2024-01-10 ENCOUNTER — Encounter (HOSPITAL_COMMUNITY): Payer: Self-pay | Admitting: Cardiology

## 2024-01-10 ENCOUNTER — Ambulatory Visit (HOSPITAL_COMMUNITY): Payer: Self-pay | Admitting: Pharmacist

## 2024-01-10 ENCOUNTER — Ambulatory Visit (HOSPITAL_COMMUNITY)
Admission: RE | Admit: 2024-01-10 | Discharge: 2024-01-10 | Disposition: A | Discharge status: Home / Self Care | Source: Ambulatory Visit | Attending: Cardiology | Admitting: Cardiology

## 2024-01-10 VITALS — BP 113/69 | HR 62 | Wt 184.0 lb

## 2024-01-10 DIAGNOSIS — Z7901 Long term (current) use of anticoagulants: Secondary | ICD-10-CM | POA: Diagnosis not present

## 2024-01-10 DIAGNOSIS — I5022 Chronic systolic (congestive) heart failure: Secondary | ICD-10-CM | POA: Insufficient documentation

## 2024-01-10 DIAGNOSIS — I429 Cardiomyopathy, unspecified: Secondary | ICD-10-CM | POA: Diagnosis not present

## 2024-01-10 DIAGNOSIS — I132 Hypertensive heart and chronic kidney disease with heart failure and with stage 5 chronic kidney disease, or end stage renal disease: Secondary | ICD-10-CM | POA: Insufficient documentation

## 2024-01-10 DIAGNOSIS — Z79899 Other long term (current) drug therapy: Secondary | ICD-10-CM | POA: Insufficient documentation

## 2024-01-10 DIAGNOSIS — R946 Abnormal results of thyroid function studies: Secondary | ICD-10-CM | POA: Insufficient documentation

## 2024-01-10 DIAGNOSIS — Z955 Presence of coronary angioplasty implant and graft: Secondary | ICD-10-CM | POA: Diagnosis not present

## 2024-01-10 DIAGNOSIS — N186 End stage renal disease: Secondary | ICD-10-CM | POA: Insufficient documentation

## 2024-01-10 DIAGNOSIS — Z95811 Presence of heart assist device: Secondary | ICD-10-CM | POA: Diagnosis not present

## 2024-01-10 DIAGNOSIS — I2510 Atherosclerotic heart disease of native coronary artery without angina pectoris: Secondary | ICD-10-CM | POA: Insufficient documentation

## 2024-01-10 DIAGNOSIS — I482 Chronic atrial fibrillation, unspecified: Secondary | ICD-10-CM | POA: Insufficient documentation

## 2024-01-10 DIAGNOSIS — I5023 Acute on chronic systolic (congestive) heart failure: Secondary | ICD-10-CM | POA: Diagnosis not present

## 2024-01-10 LAB — CBC
HCT: 42.1 % (ref 39.0–52.0)
Hemoglobin: 13.4 g/dL (ref 13.0–17.0)
MCH: 29.4 pg (ref 26.0–34.0)
MCHC: 31.8 g/dL (ref 30.0–36.0)
MCV: 92.3 fL (ref 80.0–100.0)
Platelets: 201 10*3/uL (ref 150–400)
RBC: 4.56 MIL/uL (ref 4.22–5.81)
RDW: 15.9 % — ABNORMAL HIGH (ref 11.5–15.5)
WBC: 3.1 10*3/uL — ABNORMAL LOW (ref 4.0–10.5)
nRBC: 0 % (ref 0.0–0.2)

## 2024-01-10 LAB — BASIC METABOLIC PANEL WITH GFR
Anion gap: 7 (ref 5–15)
BUN: 47 mg/dL — ABNORMAL HIGH (ref 8–23)
CO2: 24 mmol/L (ref 22–32)
Calcium: 9.1 mg/dL (ref 8.9–10.3)
Chloride: 106 mmol/L (ref 98–111)
Creatinine, Ser: 2.83 mg/dL — ABNORMAL HIGH (ref 0.61–1.24)
GFR, Estimated: 23 mL/min — ABNORMAL LOW (ref >60)
Glucose, Bld: 78 mg/dL (ref 70–99)
Potassium: 4 mmol/L (ref 3.5–5.1)
Sodium: 137 mmol/L (ref 135–145)

## 2024-01-10 LAB — PROTIME-INR
INR: 2 — ABNORMAL HIGH (ref 0.8–1.2)
Prothrombin Time: 22.5 s — ABNORMAL HIGH (ref 11.4–15.2)

## 2024-01-10 LAB — LACTATE DEHYDROGENASE: LDH: 184 U/L (ref 98–192)

## 2024-01-10 MED ORDER — FUROSEMIDE 20 MG PO TABS: 40.0000 mg | ORAL_TABLET | Freq: Every day | ORAL | 1 refills | Status: DC

## 2024-01-10 NOTE — Progress Notes (Addendum)
 Patient presents for 2 month f/u in VAD Clinic alone. Denies issues/concerns with VAD equipment or drive line.    Patient ambulated in to clinic independently without difficulty. Denies lightheadedness, dizziness, falls, shortness of breath, and signs of bleeding. States he is remaining active through daily walks [place]  Pt followed by Dr. Monte Antonio with [facility]. F/u 03/01/24 with Dr Monte Antonio.   ICD battery at [facility]. Discussed again with Dr Mitzie Anda. Will not plan to change generator at this time. Reports intermittent beeping from device. Advised to call EP office to address beeping.   Vital Signs:  Doppler Pressure: 84 Automatic BP: 113/69 (79) HR: 62 SPO2: 98%   Weight: 184 lb w/ eqt Last weight: 186.4 lb w/eqt   VAD Indication: Destination Therapy due to age and kidney disease   LVAD assessment:  HM III: Speed: 5300 rpms Flow: 4.8 Power: 3.9 w    PI: 4.0 Alarms: none Events: rare   Fixed speed: 5300 Low speed limit: 5000  Primary controller: back up battery due for replacement in 11 months Secondary controller:  back up battery due for replacement in 8 months   I reviewed the LVAD parameters from today and compared the results to the patient's prior recorded data. LVAD interrogation was NEGATIVE for significant power changes, NEGATIVE for clinical alarms and STABLE for PI events/speed drops. No programming changes were made and pump is functioning within specified parameters. Pt is performing daily controller and system monitor self tests along with completing weekly and monthly maintenance for LVAD equipment.   LVAD equipment check completed and is in good working order. Back-up equipment present. Charged back up battery and performed self-test on equipment.    Annual Equipment Maintenance on UBC/PM was performed on today 08/30/23.     Exit Site Care: Wife has been performing weekly dressing changes using weekly dressings. Drive line anchor  correctly applied.  Pt given 8 weekly kits, betadine, 2 x 2s and 15 anchors (10 cath grip. 5 foley) for home use.   Device:  [employer] dual  Therapies: on Pacing: VVI 50 Last check: 06/14/23   BP & Labs:  Doppler 84 - Doppler is reflecting MAP   Hgb 13.4- No S/S of bleeding. Specifically denies melena/BRBPR or nosebleeds.   LDH stable at 184 with established baseline of 200 - 300. Denies tea-colored urine. No power elevations noted on interrogation.   Patient Instructions:  No medication changes Coumadin  dosing per Lauren PharmD Return to clinic in 3 months for follow up with Dr Mitzie Anda. We will complete your 2.5 year Intermacs at your next visit. Please bring your black bag and wear tennis shoes for your 6 minute walk.   Addendum: Creatinine 2.83 today. Discussed with Dr Mitzie Anda. Order received to decrease Lasix  to 40 mg daily. Will repeat BMET in 2 weeks. Called and spoke with pt regarding medication change and need for repeat BMET. Lab scheduled 5/5 at 11:00. Advised to notify VAD coordinators if he is symptomatic with Lasix  decrease. He verbalized understanding to all the above. Updated prescription sent to pt's pharmacy.   Paulo Bosworth RN VAD Coordinator  Office: [phone]  24/7 Pager: [phone]       Cardiology: Dr. Mitzie Anda PCP: Lyle San, MD  Chief complaint: CHF  Follow up for Heart Failure/LVAD: 73 y.o. with history of CAD, HTN, atrial fibrillation, and chronic systolic CHF returns for followup of CHF/LVAD.   Patient has had a long-standing cardiomyopathy, EF had been  in the 25% range for years.  He had PCI to OM1 in 2007 and RCA in 2013.  From his report, these episodes do not sound like ACS events.  Echo in 12/22 showed EF < 20%, severe LV dilation, restrictive diastolic function, moderate RV dysfunction, moderate MR, mod-severe TR. HF was been complicated by CKD stage 3. He was also noted on device interrogation to have been in atrial fibrillation  persistently since 10/22.  He was admitted at [facility] in 12/22.  BP was low, his BP-active meds were stopped and midodrine  was begun.  He was diuresed and discharged home.    He reports ongoing severe dyspnea and was admitted again in 12/22 after he stood up then passed out at home.  Device interrogation showed run of VT treated x 2 with ATP possibly around the time of syncope.  RHC in 12/22 showed low output HF and he developed cardiogenic shock. Impella 5.5 was placed.  On 09/17/21, HM3 LVAD was placed, ICD leads were plastered to the tricuspid valve with severe TR, the valve was not replaced.  The patient had significant interoperative hypotension.  Post-operatively, he developed progressive renal failure and was started on CVVH then iHD.  He had a bumpy post-op course with renal failure and RV failure, but was ultimately able to discharge home on 10/24/21.   Ramp echo in 3/23, RV mildly dilated with normal systolic function, severe TR, LV EF 20-25%, aortic valve opens every beat.  Speed increased to 5300 rpm. The IV septum was mildly leftwards at 5300 rpm but flow increased and AoV still opened every beat.   Patient was admitted in 3/24 with left groin abscess.  This spontaneously drained and was treated with IV antibiotics.  He was sent home on linezolid  and Augmentin .  He developed profuse diarrhea that he attributed to linezolid .  Linezolid  was stopped and he was continued on Augmentin .   Echo in 7/24 showed EF < 20%, IV septum midline to slightly leftward, RV moderately dilated with moderately decreased systolic function, IVC dilated with severe TR.  Speed not changed.   Patient returns for LVAD followup.  Weight down 4 lbs.  Able to get out and do hours of yardwork without significant dyspnea. No limitations. No lightheadedness.  No BRBPR/melena. MAP 79.   Denies LVAD alarms.  Denies driveline trauma, erythema or drainage.  Denies ICD shocks.   Reports taking Coumadin  as prescribed and adherence to  anticoagulation based dietary restrictions.  Denies bright red blood per rectum or melena, no dark urine or hematuria.    Labs (3/23): LFTs normal, TSH mildly elevated Labs (7/23): hgb 12.1 Labs (8/23): hgb 11.8 => 9.9, K 4.4, creatinine 2.5 => 3.09, LFTs normal.  Labs (11/23): K 4.5, creatinine 2.95 Labs (1/24): TSH 5.2 Labs (3/24): hgb 10.7, LDH 157, LFTs normal, creatinine 2.75 Labs (5/24): K 3.6, creatinine 2.7, LFTs normal, TSH normal Labs (7/24): hgb 11.7, LDH 172, K 3.5, creatinine 2.33 Labs (10/24): K 3.4, creatinine 2.24 Labs (12/24): K 4, creatinine 2.37 Labs (2/25): TSH normal Labs (3/25): hgb 12.5, K 4.9, creatinine 2.57 Labs (4/25): creatinine 2.8  PMH: 1. HTN 2. Hyperlipidemia 3. CAD: H/o PCI to OM1 2007 and PCI to RCA in 2013.  Uncertain of circumstances but do not sound like ACS events.  4. CKD stage IV: Patient was on HD for several months post-LVAD but was able to come off.  5. Atrial fibrillation: Paroxysmal 6. Chronic systolic CHF: Long-standing cardiomyopathy with EF in the 25% range.  Cause uncertain, has history of PCI to RCA and OM1, but degree of CAD does not seem to explain severity of cardiomyopathy. Father had history of CAD/CHF.  Not heavy drinker. Has [employer] ICD.  - Echo (12/22): EF < 20%, severe LV dilation, restrictive diastolic function, moderate RV dysfunction, moderate MR, mod-severe TR.  - 12/22 cardiogenic shock with Impella 5.5 placement then Heartmate 3 LVAD.  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  - Echo (7/24): EF < 20%, IV septum midline to slightly leftward, RV moderately dilated with moderately decreased systolic function, IVC dilated with severe TR. 7. VT - Nausea with amiodarone .  8. RV failure/severe TR 9. Citrobacter UTI (8/23) 10. Left groin abscess 3/24   Current Outpatient Medications  Medication Sig Dispense Refill   pantoprazole  (PROTONIX ) 40 MG tablet TAKE 1 TABLET BY MOUTH  EVERY DAY (Patient taking differently: Take 40 mg by mouth daily before breakfast.) 90 tablet 3   rosuvastatin  (CRESTOR ) 10 MG tablet TAKE 1 TABLET BY MOUTH EVERY DAY 90 tablet 3   sildenafil  (REVATIO ) 20 MG tablet Take 1 tablet (20 mg total) by mouth 3 (three) times daily. 90 tablet 11   warfarin (COUMADIN ) 2 MG tablet Instructed to take 4 mg (2 tabs) every Monday and 2 mg (1 tab) all other days or as directed by HF Clinic 50 tablet 11   furosemide  (LASIX ) 20 MG tablet Take 2 tablets (40 mg total) by mouth daily. 270 tablet 1   ondansetron  (ZOFRAN ) 4 MG tablet Take 4 mg by mouth every 8 (eight) hours as needed for nausea or vomiting. (Patient not taking: Reported on 01/10/2024)     No current facility-administered medications for this encounter.    Mushroom extract complex (obsolete), Doxycycline , Neosporin [neomycin-bacitracin zn-polymyx], and Tape  REVIEW OF SYSTEMS: All systems negative except as listed in HPI, PMH and Problem list.  LVAD INTERROGATION:  See LVAD nurse's note above.  I reviewed the LVAD parameters   I reviewed the LVAD parameters from today, and compared the results to the patient's prior recorded data.  No programming changes were made.  The LVAD is functioning within specified parameters.  The patient performs LVAD self-test daily.  LVAD interrogation was negative for any significant power changes, alarms or PI events/speed drops.  LVAD equipment check completed and is in good working order.  Back-up equipment present.   LVAD education done on emergency procedures and precautions and reviewed exit site care.    Vitals:   01/10/24 1125 01/10/24 1126  BP: (!) 84/0 113/69  Pulse: 62   SpO2: 98%   Weight: 83.5 kg (184 lb)   MAP 79  Physical Exam: General: Well appearing this am. NAD.  HEENT: Normal. Neck: Supple, JVP 8 cm. Carotids OK.  Cardiac:  Mechanical heart sounds with LVAD hum present.  Lungs:  CTAB, normal effort.  Abdomen:  NT, ND, no HSM. No bruits or  masses. +BS  LVAD exit site: Well-healed and incorporated. Dressing dry and intact. No erythema or drainage. Stabilization device present and accurately applied. Driveline dressing changed daily per sterile technique. Extremities:  Warm and dry. No cyanosis, clubbing, rash, or edema.  Neuro:  Alert & oriented x 3. Cranial nerves grossly intact. Moves all 4 extremities w/o difficulty. Affect pleasant    ASSESSMENT AND PLAN: 1. Chronic systolic CHF:  Long-standing cardiomyopathy.  [employer] ICD.  Echo in 12/22 with EF < 20%, severe LV dilation, restrictive diastolic function, moderate RV dysfunction,  moderate MR, mod-severe TR. Cause of cardiomyopathy is uncertain.  He has a history of CAD, but I do not think that the described CAD from the past could explain his cardiomyopathy, but CAD could have progressed.  He was admitted in 12/22 with cardiogenic shock and Impella 5.5. s/p HM III VAD on 09/17/22.  Post-op course complicated by AKI and RV failure. He was on HD for several months but is now off.  Echo in 7/24 showed EF < 20%, IV septum midline to slightly leftward, RV moderately dilated with moderately decreased systolic function, IVC dilated with severe TR.  NYHA class I symptoms.  He does not look volume overloaded on exam.  Creatinine up to 2.8.  - Cut Lasix  back to 40 mg daily.    - Continue sildenafil  20 tid for RV.   - Warfarin for INR 2-2.5.   - His [employer] device is at [employer].  He has never been shocked.  We have discussed generator replacement vs no replacement.  He is inclined to avoid further procedures, so I told him that I thought it would be reasonable not to replace generator.  2. Tricuspid regurgitation:  Tricuspid repair not done at time of VAD due to proximity of ICD wires and hypotension during surgery.  He has severe TR.  3. Atrial fibrillation: Chronic at this point.  - Atrial fibrillation now seems chronic, he is off amiodarone  with resolution of nausea.  I will not  restart it.  4. CKD stage IV: Suspect intra-op hypotension at the time of LVAD placement led to development of ATN => urine sediment looked like ATN per renal. Had initial CVVH and transitioned over to HD. He is now off HD. Creatinine today 2.8.  - Decrease Lasix  to 40 mg daily.   - Follows with nephrology.  5. CAD: History of PCI to OM1 in 2007 and RCA in 2013.  No CP or ACs.  - Deferred cardiac cath in 12/22 due to AKI and plan for VAD - Continue Crestor .   6. Abnormal thyroid  function tests: Patient has had elevated TSH and free T4, mixed pattern. We have stopped amiodarone . 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  Most recent TSH was normal.  - He follows with endocrinology.   7. Nausea: This seems to have been due to amiodarone , resolved off amiodarone .   Followup in 3 months.   I spent 42 minutes reviewing records, interviewing/examining patient, and managing orders.    Peder Bourdon 01/11/2024

## 2024-01-10 NOTE — Patient Instructions (Addendum)
 No medication changes Coumadin  dosing per Lauren PharmD Return to clinic in 3 months for follow up with Dr Mitzie Anda. We will complete your 2.5 year Intermacs at your next visit. Please bring your black bag and wear tennis shoes for your 6 minute walk.

## 2024-01-11 ENCOUNTER — Other Ambulatory Visit (HOSPITAL_COMMUNITY): Payer: Self-pay | Admitting: *Deleted

## 2024-01-11 DIAGNOSIS — I5022 Chronic systolic (congestive) heart failure: Secondary | ICD-10-CM

## 2024-01-11 DIAGNOSIS — Z95811 Presence of heart assist device: Secondary | ICD-10-CM

## 2024-01-11 MED ORDER — SILDENAFIL CITRATE 20 MG PO TABS: 20.0000 mg | ORAL_TABLET | Freq: Three times a day (TID) | ORAL | 11 refills | Status: DC

## 2024-01-11 NOTE — Telephone Encounter (Signed)
 Received call from patient stating [facility] charged him $28 for a 10 day supply of Sildenafil . Pt uses [facility] for Sildenafil  prescription due to out of pocket cost. Pt would like prescription to be refilled at [facility].   Updated prescription with [facility] card information sent to [facility] for Sildenafil  20 mg TID 30 day supply with 11 refills. Emailed copy of card to pt. Advised to notify VAD coordinator if unable to obtain prescription. He verbalized understanding.     Paulo Bosworth RN VAD Coordinator  Office: [phone]  24/7 Pager: [phone]

## 2024-01-11 NOTE — Telephone Encounter (Signed)
 error

## 2024-01-21 ENCOUNTER — Ambulatory Visit (HOSPITAL_COMMUNITY): Payer: Self-pay | Admitting: Pharmacist

## 2024-01-21 ENCOUNTER — Other Ambulatory Visit (HOSPITAL_COMMUNITY): Payer: Self-pay | Admitting: Unknown Physician Specialty

## 2024-01-21 DIAGNOSIS — Z95811 Presence of heart assist device: Secondary | ICD-10-CM

## 2024-01-21 DIAGNOSIS — Z7901 Long term (current) use of anticoagulants: Secondary | ICD-10-CM

## 2024-01-21 LAB — POCT INR: INR: 2 (ref 2.0–3.0)

## 2024-01-24 ENCOUNTER — Ambulatory Visit (HOSPITAL_COMMUNITY)
Admission: RE | Admit: 2024-01-24 | Discharge: 2024-01-24 | Disposition: A | Discharge status: Home / Self Care | Source: Ambulatory Visit | Attending: Internal Medicine | Admitting: Internal Medicine

## 2024-01-24 DIAGNOSIS — Z7901 Long term (current) use of anticoagulants: Secondary | ICD-10-CM | POA: Insufficient documentation

## 2024-01-24 DIAGNOSIS — Z95811 Presence of heart assist device: Secondary | ICD-10-CM | POA: Insufficient documentation

## 2024-01-24 DIAGNOSIS — Z4509 Encounter for adjustment and management of other cardiac device: Secondary | ICD-10-CM | POA: Diagnosis present

## 2024-01-24 LAB — BASIC METABOLIC PANEL WITH GFR
Anion gap: 5 (ref 5–15)
BUN: 33 mg/dL — ABNORMAL HIGH (ref 8–23)
CO2: 24 mmol/L (ref 22–32)
Calcium: 9.1 mg/dL (ref 8.9–10.3)
Chloride: 107 mmol/L (ref 98–111)
Creatinine, Ser: 2.26 mg/dL — ABNORMAL HIGH (ref 0.61–1.24)
GFR, Estimated: 30 mL/min — ABNORMAL LOW (ref >60)
Glucose, Bld: 94 mg/dL (ref 70–99)
Potassium: 4.3 mmol/L (ref 3.5–5.1)
Sodium: 136 mmol/L (ref 135–145)

## 2024-01-25 ENCOUNTER — Telehealth (HOSPITAL_COMMUNITY): Payer: Self-pay | Admitting: Unknown Physician Specialty

## 2024-01-25 MED ORDER — CEFADROXIL 500 MG PO CAPS: 500.0000 mg | ORAL_CAPSULE | Freq: Two times a day (BID) | ORAL | 0 refills | Status: AC

## 2024-01-26 ENCOUNTER — Other Ambulatory Visit (HOSPITAL_COMMUNITY)

## 2024-01-26 ENCOUNTER — Other Ambulatory Visit: Payer: Self-pay | Admitting: Internal Medicine

## 2024-01-26 NOTE — Telephone Encounter (Signed)
 Received page from pt at [facility] stating that he has some drainage coming from his driveline. Pt tells me that he is also experiencing some mild pain in this area. He states that he may have "rolled over" on it during the night. D/w Dr Mclean and Hortensia Ma will prescribed Cefadroxil renal dose 500 bid for 10 days. We will f/u with pt at the end of the week by phone to see how driveline is progressing. VAD coordinator gave pt and wife the opportunity to come to clinic in the morning for the VAD coordinators to check the site. They declined this offer.      Adams Adams RN, BSN VAD Coordinator 24/7 Pager [phone]

## 2024-01-28 ENCOUNTER — Telehealth (HOSPITAL_COMMUNITY): Payer: Self-pay | Admitting: Unknown Physician Specialty

## 2024-01-28 NOTE — Telephone Encounter (Signed)
 Called pt today to check in with him on the progress of his driveline. Pt was instructed to start Cefadroxil  Tuesday night for presumed driveline infection. Pt was c/o pain and drainage. VAD coordinator spoke with pts spouse [name] who confirms that pt did start the antibiotic. She states that he has no more pain or drainage. Wife confirms that the pt will complete course of antibiotics. VAD coordinator instructed wife to call if pt develops pain or drainage.  Adams Adams RN, BSN VAD Coordinator 24/7 Pager [phone]

## 2024-01-31 ENCOUNTER — Ambulatory Visit (HOSPITAL_COMMUNITY): Payer: Self-pay | Admitting: Pharmacist

## 2024-01-31 LAB — POCT INR: INR: 1.4 — AB (ref 2.0–3.0)

## 2024-02-02 ENCOUNTER — Emergency Department (HOSPITAL_COMMUNITY)
Admission: EM | Admit: 2024-02-02 | Discharge: 2024-02-03 | Disposition: A | Discharge status: Home / Self Care | Source: Home / Self Care | Attending: Emergency Medicine | Admitting: Emergency Medicine

## 2024-02-02 ENCOUNTER — Encounter (HOSPITAL_COMMUNITY): Payer: Self-pay | Admitting: *Deleted

## 2024-02-02 ENCOUNTER — Encounter (HOSPITAL_COMMUNITY): Payer: Self-pay

## 2024-02-02 ENCOUNTER — Emergency Department (HOSPITAL_COMMUNITY)

## 2024-02-02 ENCOUNTER — Other Ambulatory Visit: Payer: Self-pay

## 2024-02-02 DIAGNOSIS — E876 Hypokalemia: Secondary | ICD-10-CM | POA: Insufficient documentation

## 2024-02-02 DIAGNOSIS — Y712 Prosthetic and other implants, materials and accessory cardiovascular devices associated with adverse incidents: Secondary | ICD-10-CM | POA: Insufficient documentation

## 2024-02-02 DIAGNOSIS — E039 Hypothyroidism, unspecified: Secondary | ICD-10-CM | POA: Insufficient documentation

## 2024-02-02 DIAGNOSIS — Z7901 Long term (current) use of anticoagulants: Secondary | ICD-10-CM | POA: Insufficient documentation

## 2024-02-02 DIAGNOSIS — I5020 Unspecified systolic (congestive) heart failure: Secondary | ICD-10-CM | POA: Insufficient documentation

## 2024-02-02 DIAGNOSIS — T82817A Embolism of cardiac prosthetic devices, implants and grafts, initial encounter: Secondary | ICD-10-CM | POA: Diagnosis not present

## 2024-02-02 DIAGNOSIS — Z4502 Encounter for adjustment and management of automatic implantable cardiac defibrillator: Secondary | ICD-10-CM

## 2024-02-02 DIAGNOSIS — T82897A Other specified complication of cardiac prosthetic devices, implants and grafts, initial encounter: Secondary | ICD-10-CM | POA: Insufficient documentation

## 2024-02-02 DIAGNOSIS — I639 Cerebral infarction, unspecified: Secondary | ICD-10-CM | POA: Diagnosis not present

## 2024-02-02 LAB — LACTATE DEHYDROGENASE: LDH: 139 U/L (ref 98–192)

## 2024-02-02 LAB — CBC
HCT: 42.1 % (ref 39.0–52.0)
Hemoglobin: 13.7 g/dL (ref 13.0–17.0)
MCH: 30.3 pg (ref 26.0–34.0)
MCHC: 32.5 g/dL (ref 30.0–36.0)
MCV: 93.1 fL (ref 80.0–100.0)
Platelets: 200 10*3/uL (ref 150–400)
RBC: 4.52 MIL/uL (ref 4.22–5.81)
RDW: 15.8 % — ABNORMAL HIGH (ref 11.5–15.5)
WBC: 4 10*3/uL (ref 4.0–10.5)
nRBC: 0 % (ref 0.0–0.2)

## 2024-02-02 LAB — BASIC METABOLIC PANEL WITH GFR
Anion gap: 4 — ABNORMAL LOW (ref 5–15)
BUN: 25 mg/dL — ABNORMAL HIGH (ref 8–23)
CO2: 20 mmol/L — ABNORMAL LOW (ref 22–32)
Calcium: 7.2 mg/dL — ABNORMAL LOW (ref 8.9–10.3)
Chloride: 116 mmol/L — ABNORMAL HIGH (ref 98–111)
Creatinine, Ser: 1.69 mg/dL — ABNORMAL HIGH (ref 0.61–1.24)
GFR, Estimated: 43 mL/min — ABNORMAL LOW (ref >60)
Glucose, Bld: 93 mg/dL (ref 70–99)
Potassium: 3.3 mmol/L — ABNORMAL LOW (ref 3.5–5.1)
Sodium: 140 mmol/L (ref 135–145)

## 2024-02-02 LAB — MAGNESIUM: Magnesium: 1.5 mg/dL — ABNORMAL LOW (ref 1.7–2.4)

## 2024-02-02 LAB — PROTIME-INR
INR: 1.6 — ABNORMAL HIGH (ref 0.8–1.2)
Prothrombin Time: 19.7 s — ABNORMAL HIGH (ref 11.4–15.2)

## 2024-02-02 MED ORDER — POTASSIUM CHLORIDE CRYS ER 20 MEQ PO TBCR
40.0000 meq | EXTENDED_RELEASE_TABLET | Freq: Once | ORAL | Status: AC
  Administered 2024-02-02: 40 meq via ORAL
  Filled 2024-02-02: qty 2

## 2024-02-02 MED ORDER — MAGNESIUM SULFATE IN D5W 1-5 GM/100ML-% IV SOLN
1.0000 g | Freq: Once | INTRAVENOUS | Status: AC
  Administered 2024-02-02: 1 g via INTRAVENOUS
  Filled 2024-02-02: qty 100

## 2024-02-02 NOTE — Discharge Instructions (Signed)
 As we discussed, follow-up with your LVAD team to follow-up on this episode.  Of course if you have any further concerns before you are able to get in with your specialty team, please return to the emergency department.

## 2024-02-02 NOTE — ED Provider Notes (Signed)
 [name] Provider Note   CSN: [id number] Arrival date & time: 02/02/24  2002     History  Chief Complaint  Patient presents with   Pacemaker Problem    Isaac [name] is a 73 y.o. male who currently presents with concerns for his defibrillator activating.  He states that he has an implanted defibrillator however this was supposed to have been deactivated a year ago.  He was washing dishes in his kitchen when suddenly he felt the defibrillator activate.  Of note he does have a LVAD which is still functioning properly and has had no dysfunction or alarms per patient note.  At present he has no symptoms, has no chest pain, shortness of breath, or any other concerning symptoms at this time.  Previous medical history is further remarkable for hypothyroidism, and hyperlipidemia, in addition to noted history of cardiomyopathy and systolic heart failure.  HPI     Home Medications Prior to Admission medications   Medication Sig Start Date End Date Taking? Authorizing Provider  cefadroxil  (DURICEF) 500 MG capsule Take 1 capsule (500 mg total) by mouth 2 (two) times daily for 10 days. 01/25/24 02/04/24  Darlis Eisenmenger, MD  furosemide  (LASIX ) 20 MG tablet Take 2 tablets (40 mg total) by mouth daily. 01/10/24   Darlis Eisenmenger, MD  ondansetron  (ZOFRAN ) 4 MG tablet Take 4 mg by mouth every 8 (eight) hours as needed for nausea or vomiting. Patient not taking: Reported on 01/10/2024    [provider]  pantoprazole  (PROTONIX ) 40 MG tablet TAKE 1 TABLET BY MOUTH EVERY DAY 01/26/24   Verona Goodwill, MD  rosuvastatin  (CRESTOR ) 10 MG tablet TAKE 1 TABLET BY MOUTH EVERY DAY 10/21/23   McLean, Dalton S, MD  sildenafil  (REVATIO ) 20 MG tablet Take 1 tablet (20 mg total) by mouth 3 (three) times daily. 01/11/24   Darlis Eisenmenger, MD  warfarin (COUMADIN ) 2 MG tablet Instructed to take 4 mg (2 tabs) every Monday and 2 mg (1 tab) all other days or as  directed by HF Clinic 11/02/23   Darlis Eisenmenger, MD      Allergies    Mushroom extract complex (obsolete), Doxycycline , Neosporin [neomycin-bacitracin zn-polymyx], and Tape    Review of Systems   Review of Systems  Respiratory:  Negative for shortness of breath.   Cardiovascular:  Negative for chest pain, palpitations and leg swelling.       Activation of implanted cardiac defibrillator  All other systems reviewed and are negative.   Physical Exam Updated Vital Signs BP 117/83   Pulse 65   Temp 98.2 F (36.8 C)   Resp 19   SpO2 100%  Physical Exam Vitals and nursing note reviewed.  Constitutional:      General: He is not in acute distress.    Appearance: Normal appearance.  HENT:     Head: Normocephalic and atraumatic.     Mouth/Throat:     Mouth: Mucous membranes are moist.     Pharynx: Oropharynx is clear.  Eyes:     Extraocular Movements: Extraocular movements intact.     Conjunctiva/sclera: Conjunctivae normal.     Pupils: Pupils are equal, round, and reactive to light.  Cardiovascular:     Rate and Rhythm: Normal rate and regular rhythm.     Chest Wall: Thrill present.     Pulses: Normal pulses.          Radial pulses are 2+ on the right  side and 2+ on the left side.     Heart sounds: No murmur heard.    No friction rub. No gallop.     Comments: Heart tones are not notable over LVAD. Pulmonary:     Effort: Pulmonary effort is normal.     Breath sounds: Normal breath sounds.  Abdominal:     General: Abdomen is flat. Bowel sounds are normal.     Palpations: Abdomen is soft.  Musculoskeletal:        General: Normal range of motion.     Cervical back: Normal range of motion and neck supple.     Right lower leg: No edema.     Left lower leg: No edema.  Skin:    General: Skin is warm and dry.     Capillary Refill: Capillary refill takes less than 2 seconds.  Neurological:     General: No focal deficit present.     Mental Status: He is alert. Mental status  is at baseline.  Psychiatric:        Mood and Affect: Mood normal.     ED Results / Procedures / Treatments   Labs (all labs ordered are listed, but only abnormal results are displayed) Labs Reviewed  CBC - Abnormal; Notable for the following components:      Result Value   RDW 15.8 (*)    All other components within normal limits  BASIC METABOLIC PANEL WITH GFR - Abnormal; Notable for the following components:   Potassium 3.3 (*)    Chloride 116 (*)    CO2 20 (*)    BUN 25 (*)    Creatinine, Ser 1.69 (*)    Calcium  7.2 (*)    GFR, Estimated 43 (*)    Anion gap 4 (*)    All other components within normal limits  MAGNESIUM  - Abnormal; Notable for the following components:   Magnesium  1.5 (*)    All other components within normal limits  PROTIME-INR - Abnormal; Notable for the following components:   Prothrombin Time 19.7 (*)    INR 1.6 (*)    All other components within normal limits  LACTATE DEHYDROGENASE    EKG EKG Interpretation Date/Time:  Wednesday Feb 02 2024 20:07:38 EDT Ventricular Rate:  68 PR Interval:    QRS Duration:  106 QT Interval:  417 QTC Calculation: 444 R Axis:   -87  Text Interpretation: Poor data quality probable paced rhythm Confirmed by Trish Furl [phone]) on 02/02/2024 8:15:08 PM  Radiology DG Chest Port 1 View Result Date: 02/02/2024 CLINICAL DATA:  Defibrillator activation EXAM: PORTABLE CHEST 1 VIEW COMPARISON:  07/02/2022 FINDINGS: Single frontal view of the chest demonstrates left ventricular cyst device overlying the cardiac apex. Cardiac silhouette is enlarged. Dual lead pacer/AICD in stable position. No airspace disease, effusion, or pneumothorax. No acute bony abnormalities. IMPRESSION: 1. Stable support devices. 2. No acute intrathoracic process. Electronically Signed   By: Bobbye Burrow M.D.   On: 02/02/2024 21:11    Procedures Procedures    Medications Ordered in ED Medications  magnesium  sulfate IVPB 1 g 100 mL (1 g  Intravenous New Bag/Given 02/02/24 2248)  potassium chloride  SA (KLOR-CON  M) CR tablet 40 mEq (40 mEq Oral Given 02/02/24 2249)    ED Course/ Medical Decision Making/ A&P                                 Medical Decision Making Amount  and/or Complexity of Data Reviewed Radiology: ordered.  Risk Prescription drug management.   Medical Decision Making:   Isaac [name] is a 73 y.o. male who presented to the ED today with implanted defibrillator activation detailed above.    Handoff received from EMS.  External chart has been reviewed including outpatient cardiology records. Patient's presentation is complicated by their history of systolic heart failure and LVAD placement.  Patient placed on continuous vitals and telemetry monitoring while in ED which was reviewed periodically.  Complete initial physical exam performed, notably the patient  was awake alert in no apparent distress.  He is in no pain, has no shortness of breath, and states that at present he is at baseline.  LVAD is functioning properly per interrogation, no signs of device failure.    Reviewed and confirmed nursing documentation for past medical history, family history, social history.    Initial Assessment:   With the patient's presentation of defibrillator activation, most likely diagnosis is implanted defibrillator malfunction.    Initial Plan:  Labs as ordered by LVAD team Screening labs including CBC and Metabolic panel to evaluate for infectious or metabolic etiology of disease.  CXR to evaluate for structural/infectious intrathoracic pathology.  EKG to evaluate for cardiac pathology Objective evaluation as below reviewed   Initial Study Results:   Laboratory  All laboratory results reviewed without evidence of clinically relevant pathology.   Exceptions include: Decreased magnesium  and potassium as noted  EKG EKG was reviewed independently. Rate, rhythm, axis, intervals all examined and without  medically relevant abnormality. ST segments without concerns for elevations.    Radiology:  All images reviewed independently. Agree with radiology report at this time.   DG Chest Port 1 View Result Date: 02/02/2024 CLINICAL DATA:  Defibrillator activation EXAM: PORTABLE CHEST 1 VIEW COMPARISON:  07/02/2022 FINDINGS: Single frontal view of the chest demonstrates left ventricular cyst device overlying the cardiac apex. Cardiac silhouette is enlarged. Dual lead pacer/AICD in stable position. No airspace disease, effusion, or pneumothorax. No acute bony abnormalities. IMPRESSION: 1. Stable support devices. 2. No acute intrathoracic process. Electronically Signed   By: Bobbye Burrow M.D.   On: 02/02/2024 21:11      Consults: Case discussed with cardiology on-call physician as well as LVAD team.  Per Dr. Bensimhon, the implanted defibrillator has deactivated a year ago so they are uncertain as to the etiology of the sensation however they are confident this patient can be outpatient discharged for follow-up..   Reassessment and Plan:   In tandem with consultation with cardiology and LVAD team, patient will be discharged for follow-up outpatient with LVAD team.  Repleted magnesium  and potassium as noted.          Final Clinical Impression(s) / ED Diagnoses Final diagnoses:  Defibrillator discharge    Rx / DC Orders ED Discharge Orders     None         Juanetta Nordmann, PA 02/02/24 2258    Trish Furl, MD 02/03/24 [phone]

## 2024-02-02 NOTE — Progress Notes (Signed)
 Responded to LVAD pt admission to ED. Pt attached to VAD cart. Margretta Shi, LVAD coordinator notified of pt's arrival, VS, LVAD numbers, and alarms.

## 2024-02-02 NOTE — ED Triage Notes (Signed)
 Patient BIB [facility] for LVAD & Pacemaker. Pacemaker was supposed to be deactivated a year ago but the shocked today at 1900. EKG shows pacing.  No symptoms prior to shock.  18G  126/74 100% RA 80s HR 91 CBG

## 2024-02-02 NOTE — Progress Notes (Signed)
 Received page from pt's wife reporting he was shocked by his ICD. Pt and wife state that his device therapy settings were recently turned off in the EP office as device is nearing EOL. Pt states he was washing dishes when he received the shock. Denies lightheadedness, dizziness, shortness of breath, or feeling poorly. States he has been in his usual state of health all day. VAD #: speed 5300 flow: 4.6 pi 3.2 power 3.9. Denies alarms from VAD.   Requested they send remote report to device clinic. Offered to bring pt to clinic in the morning for evaluation. Both pt and wife prefer to be evaluated in the ER tonight. They called 911 and will come to [facility].   Dr Julane Ny aware of the above.   Paulo Bosworth RN  VAD Coordinator  Office: [phone]  Pager: [phone]

## 2024-02-02 NOTE — ED Provider Notes (Signed)
 I provided a substantive portion of the care of this patient.  I personally made/approved the management plan for this patient and take responsibility for the patient management. {Remember to document shared critical care using "edcritical" dot phrase:1} EKG Interpretation Date/Time:  Wednesday Feb 02 2024 20:07:38 EDT Ventricular Rate:  68 PR Interval:    QRS Duration:  106 QT Interval:  417 QTC Calculation: 444 R Axis:   -87  Text Interpretation: Poor data quality probable paced rhythm Confirmed by Trish Furl [phone] on 02/02/2024 8:15:08 PM

## 2024-02-03 ENCOUNTER — Telehealth: Payer: Self-pay | Admitting: Internal Medicine

## 2024-02-03 ENCOUNTER — Telehealth (HOSPITAL_COMMUNITY): Payer: Self-pay | Admitting: Unknown Physician Specialty

## 2024-02-03 ENCOUNTER — Ambulatory Visit: Attending: Cardiovascular Disease

## 2024-02-03 DIAGNOSIS — I5022 Chronic systolic (congestive) heart failure: Secondary | ICD-10-CM

## 2024-02-03 MED ORDER — POTASSIUM CHLORIDE CRYS ER 20 MEQ PO TBCR: 20.0000 meq | EXTENDED_RELEASE_TABLET | Freq: Every day | ORAL | 3 refills | Status: DC

## 2024-02-03 MED ORDER — MAGNESIUM OXIDE 400 MG PO TABS: 400.0000 mg | ORAL_TABLET | Freq: Every day | ORAL | 3 refills | Status: DC

## 2024-02-03 NOTE — Telephone Encounter (Signed)
 Pt reported to ED last night for presumed ICD shock. Pts Mg was 1.5 and his potassium was 3.3. these were replaced in ED. Device clinic is working with pt on device changes and disabling device. The above was d/w Dr Mclean Mag Oxide 400 mg daily and Potassium 20 mEq daily were sent to the pts pharmacy. Pt verbalized understanding of instructions. A lab only appt was made for next week for Mg and bmet.  Adams Adams RN, BSN VAD Coordinator 24/7 Pager [phone]

## 2024-02-03 NOTE — Telephone Encounter (Signed)
  1. Has your device fired?   Yes  2. Is you device beeping?  No, but it did beep this morning (16 count)  3. Are you experiencing draining or swelling at device site?   No  4. Are you calling to see if we received your device transmission?   No  5. Have you passed out?   No  Please route to Device Clinic Pool   Wife stated patient's defibrillator discharged yesterday and it was supposed to have been deactivated.  Wife stated can call patient back directly regarding next steps.

## 2024-02-03 NOTE — Telephone Encounter (Addendum)
 Outreach made to Pt and wife.  Pt thinks he may have received HV therapy.  He subsequently went to the ER for evaluation.  Before he went to the ER his wife plugged his bedside monitor in and sent a transmission.  This transmission indicated that Pt's HV therapies were still active.  However, because device is ERI the device stopped storing episodes November 2024.  Discussed with family.  They would like to turn off HV therapies to avoid any further shocks.  Discussed with Joey with [employer].  He indicates that therapies can still be turned off.  Discussed with family.  They will come to office today at 1:30 pm to have therapies disabled.  Confirmed verbal order with VAD clinic.

## 2024-02-04 ENCOUNTER — Other Ambulatory Visit (HOSPITAL_COMMUNITY): Payer: Self-pay

## 2024-02-04 ENCOUNTER — Ambulatory Visit (HOSPITAL_COMMUNITY): Payer: Self-pay | Admitting: Pharmacist

## 2024-02-04 DIAGNOSIS — Z7901 Long term (current) use of anticoagulants: Secondary | ICD-10-CM

## 2024-02-04 DIAGNOSIS — Z95811 Presence of heart assist device: Secondary | ICD-10-CM

## 2024-02-04 LAB — POCT INR: INR: 2.2 (ref 2.0–3.0)

## 2024-02-05 ENCOUNTER — Encounter (HOSPITAL_COMMUNITY): Payer: Self-pay | Admitting: Emergency Medicine

## 2024-02-05 ENCOUNTER — Emergency Department (HOSPITAL_COMMUNITY)

## 2024-02-05 ENCOUNTER — Other Ambulatory Visit: Payer: Self-pay

## 2024-02-05 ENCOUNTER — Inpatient Hospital Stay (HOSPITAL_COMMUNITY)
Admission: EM | Admit: 2024-02-05 | Discharge: 2024-02-10 | DRG: 245 | Disposition: A | Discharge status: Rehabilitation Facility | Attending: Cardiology | Admitting: Cardiology

## 2024-02-05 DIAGNOSIS — I63432 Cerebral infarction due to embolism of left posterior cerebral artery: Secondary | ICD-10-CM | POA: Diagnosis present

## 2024-02-05 DIAGNOSIS — I4821 Permanent atrial fibrillation: Secondary | ICD-10-CM | POA: Diagnosis present

## 2024-02-05 DIAGNOSIS — I071 Rheumatic tricuspid insufficiency: Secondary | ICD-10-CM | POA: Diagnosis present

## 2024-02-05 DIAGNOSIS — Z95818 Presence of other cardiac implants and grafts: Secondary | ICD-10-CM | POA: Diagnosis not present

## 2024-02-05 DIAGNOSIS — R29709 NIHSS score 9: Secondary | ICD-10-CM | POA: Diagnosis present

## 2024-02-05 DIAGNOSIS — Z4502 Encounter for adjustment and management of automatic implantable cardiac defibrillator: Secondary | ICD-10-CM | POA: Diagnosis not present

## 2024-02-05 DIAGNOSIS — N184 Chronic kidney disease, stage 4 (severe): Secondary | ICD-10-CM | POA: Diagnosis present

## 2024-02-05 DIAGNOSIS — Z95811 Presence of heart assist device: Secondary | ICD-10-CM

## 2024-02-05 DIAGNOSIS — D649 Anemia, unspecified: Secondary | ICD-10-CM | POA: Diagnosis not present

## 2024-02-05 DIAGNOSIS — I63442 Cerebral infarction due to embolism of left cerebellar artery: Secondary | ICD-10-CM | POA: Diagnosis present

## 2024-02-05 DIAGNOSIS — F05 Delirium due to known physiological condition: Secondary | ICD-10-CM | POA: Diagnosis not present

## 2024-02-05 DIAGNOSIS — R29810 Facial weakness: Secondary | ICD-10-CM | POA: Diagnosis present

## 2024-02-05 DIAGNOSIS — Z7901 Long term (current) use of anticoagulants: Secondary | ICD-10-CM

## 2024-02-05 DIAGNOSIS — R278 Other lack of coordination: Secondary | ICD-10-CM | POA: Diagnosis present

## 2024-02-05 DIAGNOSIS — I428 Other cardiomyopathies: Secondary | ICD-10-CM | POA: Diagnosis present

## 2024-02-05 DIAGNOSIS — Z79899 Other long term (current) drug therapy: Secondary | ICD-10-CM

## 2024-02-05 DIAGNOSIS — K5901 Slow transit constipation: Secondary | ICD-10-CM | POA: Diagnosis not present

## 2024-02-05 DIAGNOSIS — I5022 Chronic systolic (congestive) heart failure: Secondary | ICD-10-CM | POA: Diagnosis present

## 2024-02-05 DIAGNOSIS — I2510 Atherosclerotic heart disease of native coronary artery without angina pectoris: Secondary | ICD-10-CM | POA: Diagnosis present

## 2024-02-05 DIAGNOSIS — I472 Ventricular tachycardia, unspecified: Secondary | ICD-10-CM | POA: Diagnosis present

## 2024-02-05 DIAGNOSIS — K219 Gastro-esophageal reflux disease without esophagitis: Secondary | ICD-10-CM | POA: Diagnosis present

## 2024-02-05 DIAGNOSIS — Z8249 Family history of ischemic heart disease and other diseases of the circulatory system: Secondary | ICD-10-CM

## 2024-02-05 DIAGNOSIS — N183 Chronic kidney disease, stage 3 unspecified: Secondary | ICD-10-CM | POA: Diagnosis not present

## 2024-02-05 DIAGNOSIS — R4701 Aphasia: Secondary | ICD-10-CM | POA: Diagnosis present

## 2024-02-05 DIAGNOSIS — I429 Cardiomyopathy, unspecified: Secondary | ICD-10-CM

## 2024-02-05 DIAGNOSIS — Z9861 Coronary angioplasty status: Secondary | ICD-10-CM

## 2024-02-05 DIAGNOSIS — I63532 Cerebral infarction due to unspecified occlusion or stenosis of left posterior cerebral artery: Secondary | ICD-10-CM | POA: Diagnosis not present

## 2024-02-05 DIAGNOSIS — R001 Bradycardia, unspecified: Secondary | ICD-10-CM | POA: Diagnosis not present

## 2024-02-05 DIAGNOSIS — I4891 Unspecified atrial fibrillation: Secondary | ICD-10-CM | POA: Diagnosis not present

## 2024-02-05 DIAGNOSIS — I6389 Other cerebral infarction: Secondary | ICD-10-CM | POA: Diagnosis not present

## 2024-02-05 DIAGNOSIS — Y831 Surgical operation with implant of artificial internal device as the cause of abnormal reaction of the patient, or of later complication, without mention of misadventure at the time of the procedure: Secondary | ICD-10-CM | POA: Diagnosis present

## 2024-02-05 DIAGNOSIS — I639 Cerebral infarction, unspecified: Secondary | ICD-10-CM | POA: Diagnosis present

## 2024-02-05 DIAGNOSIS — Z9102 Food additives allergy status: Secondary | ICD-10-CM

## 2024-02-05 DIAGNOSIS — N1832 Chronic kidney disease, stage 3b: Secondary | ICD-10-CM | POA: Diagnosis not present

## 2024-02-05 DIAGNOSIS — I130 Hypertensive heart and chronic kidney disease with heart failure and stage 1 through stage 4 chronic kidney disease, or unspecified chronic kidney disease: Secondary | ICD-10-CM | POA: Diagnosis present

## 2024-02-05 DIAGNOSIS — E8722 Chronic metabolic acidosis: Secondary | ICD-10-CM | POA: Diagnosis present

## 2024-02-05 DIAGNOSIS — T82817A Embolism of cardiac prosthetic devices, implants and grafts, initial encounter: Secondary | ICD-10-CM | POA: Diagnosis present

## 2024-02-05 DIAGNOSIS — H5340 Unspecified visual field defects: Secondary | ICD-10-CM | POA: Diagnosis present

## 2024-02-05 DIAGNOSIS — G8191 Hemiplegia, unspecified affecting right dominant side: Secondary | ICD-10-CM | POA: Diagnosis present

## 2024-02-05 DIAGNOSIS — E039 Hypothyroidism, unspecified: Secondary | ICD-10-CM | POA: Diagnosis present

## 2024-02-05 DIAGNOSIS — I482 Chronic atrial fibrillation, unspecified: Secondary | ICD-10-CM | POA: Diagnosis not present

## 2024-02-05 DIAGNOSIS — I252 Old myocardial infarction: Secondary | ICD-10-CM | POA: Diagnosis not present

## 2024-02-05 DIAGNOSIS — Z87891 Personal history of nicotine dependence: Secondary | ICD-10-CM

## 2024-02-05 DIAGNOSIS — I959 Hypotension, unspecified: Secondary | ICD-10-CM | POA: Diagnosis not present

## 2024-02-05 DIAGNOSIS — R29711 NIHSS score 11: Secondary | ICD-10-CM

## 2024-02-05 DIAGNOSIS — K5900 Constipation, unspecified: Secondary | ICD-10-CM | POA: Diagnosis not present

## 2024-02-05 DIAGNOSIS — E785 Hyperlipidemia, unspecified: Secondary | ICD-10-CM | POA: Diagnosis present

## 2024-02-05 DIAGNOSIS — Z83438 Family history of other disorder of lipoprotein metabolism and other lipidemia: Secondary | ICD-10-CM

## 2024-02-05 DIAGNOSIS — Z91048 Other nonmedicinal substance allergy status: Secondary | ICD-10-CM

## 2024-02-05 DIAGNOSIS — I63411 Cerebral infarction due to embolism of right middle cerebral artery: Secondary | ICD-10-CM | POA: Diagnosis not present

## 2024-02-05 DIAGNOSIS — G4700 Insomnia, unspecified: Secondary | ICD-10-CM | POA: Diagnosis not present

## 2024-02-05 DIAGNOSIS — T148XXA Other injury of unspecified body region, initial encounter: Secondary | ICD-10-CM | POA: Diagnosis not present

## 2024-02-05 LAB — CBC WITH DIFFERENTIAL/PLATELET
Abs Immature Granulocytes: 0.01 10*3/uL (ref 0.00–0.07)
Basophils Absolute: 0 10*3/uL (ref 0.0–0.1)
Basophils Relative: 1 %
Eosinophils Absolute: 0.3 10*3/uL (ref 0.0–0.5)
Eosinophils Relative: 5 %
HCT: 40.8 % (ref 39.0–52.0)
Hemoglobin: 13.1 g/dL (ref 13.0–17.0)
Immature Granulocytes: 0 %
Lymphocytes Relative: 8 %
Lymphs Abs: 0.5 10*3/uL — ABNORMAL LOW (ref 0.7–4.0)
MCH: 29.6 pg (ref 26.0–34.0)
MCHC: 32.1 g/dL (ref 30.0–36.0)
MCV: 92.1 fL (ref 80.0–100.0)
Monocytes Absolute: 0.9 10*3/uL (ref 0.1–1.0)
Monocytes Relative: 14 %
Neutro Abs: 4.5 10*3/uL (ref 1.7–7.7)
Neutrophils Relative %: 72 %
Platelets: 171 10*3/uL (ref 150–400)
RBC: 4.43 MIL/uL (ref 4.22–5.81)
RDW: 15.9 % — ABNORMAL HIGH (ref 11.5–15.5)
WBC: 6.2 10*3/uL (ref 4.0–10.5)
nRBC: 0 % (ref 0.0–0.2)

## 2024-02-05 LAB — PROTIME-INR
INR: 2.2 — ABNORMAL HIGH (ref 0.8–1.2)
Prothrombin Time: 24.6 s — ABNORMAL HIGH (ref 11.4–15.2)

## 2024-02-05 LAB — MAGNESIUM: Magnesium: 2 mg/dL (ref 1.7–2.4)

## 2024-02-05 LAB — COMPREHENSIVE METABOLIC PANEL WITH GFR
ALT: 21 U/L (ref 0–44)
AST: 26 U/L (ref 15–41)
Albumin: 3.5 g/dL (ref 3.5–5.0)
Alkaline Phosphatase: 92 U/L (ref 38–126)
Anion gap: 9 (ref 5–15)
BUN: 42 mg/dL — ABNORMAL HIGH (ref 8–23)
CO2: 21 mmol/L — ABNORMAL LOW (ref 22–32)
Calcium: 9.3 mg/dL (ref 8.9–10.3)
Chloride: 108 mmol/L (ref 98–111)
Creatinine, Ser: 2.48 mg/dL — ABNORMAL HIGH (ref 0.61–1.24)
GFR, Estimated: 27 mL/min — ABNORMAL LOW (ref >60)
Glucose, Bld: 106 mg/dL — ABNORMAL HIGH (ref 70–99)
Potassium: 4.2 mmol/L (ref 3.5–5.1)
Sodium: 138 mmol/L (ref 135–145)
Total Bilirubin: 1.5 mg/dL — ABNORMAL HIGH (ref 0.0–1.2)
Total Protein: 6.8 g/dL (ref 6.5–8.1)

## 2024-02-05 LAB — MRSA NEXT GEN BY PCR, NASAL: MRSA by PCR Next Gen: NOT DETECTED

## 2024-02-05 LAB — RAPID URINE DRUG SCREEN, HOSP PERFORMED
Amphetamines: NOT DETECTED
Barbiturates: NOT DETECTED
Benzodiazepines: NOT DETECTED
Cocaine: NOT DETECTED
Opiates: NOT DETECTED
Tetrahydrocannabinol: NOT DETECTED

## 2024-02-05 LAB — I-STAT CHEM 8, ED
BUN: 40 mg/dL — ABNORMAL HIGH (ref 8–23)
Calcium, Ion: 1.2 mmol/L (ref 1.15–1.40)
Chloride: 108 mmol/L (ref 98–111)
Creatinine, Ser: 2.6 mg/dL — ABNORMAL HIGH (ref 0.61–1.24)
Glucose, Bld: 101 mg/dL — ABNORMAL HIGH (ref 70–99)
HCT: 41 % (ref 39.0–52.0)
Hemoglobin: 13.9 g/dL (ref 13.0–17.0)
Potassium: 4.3 mmol/L (ref 3.5–5.1)
Sodium: 141 mmol/L (ref 135–145)
TCO2: 21 mmol/L — ABNORMAL LOW (ref 22–32)

## 2024-02-05 LAB — APTT: aPTT: 41 s — ABNORMAL HIGH (ref 24–36)

## 2024-02-05 LAB — GLUCOSE, CAPILLARY: Glucose-Capillary: 148 mg/dL — ABNORMAL HIGH (ref 70–99)

## 2024-02-05 LAB — BRAIN NATRIURETIC PEPTIDE: B Natriuretic Peptide: 343.8 pg/mL — ABNORMAL HIGH (ref 0.0–100.0)

## 2024-02-05 LAB — TYPE AND SCREEN
ABO/RH(D): O POS
Antibody Screen: NEGATIVE

## 2024-02-05 LAB — ETHANOL: Alcohol, Ethyl (B): <15 mg/dL (ref <15)

## 2024-02-05 LAB — TROPONIN I (HIGH SENSITIVITY)
Troponin I (High Sensitivity): 133 ng/L (ref <18)
Troponin I (High Sensitivity): 185 ng/L (ref <18)

## 2024-02-05 LAB — HEPARIN LEVEL (UNFRACTIONATED): Heparin Unfractionated: 0.2 [IU]/mL — ABNORMAL LOW (ref 0.30–0.70)

## 2024-02-05 LAB — TSH: TSH: 2.697 u[IU]/mL (ref 0.350–4.500)

## 2024-02-05 LAB — I-STAT CG4 LACTIC ACID, ED: Lactic Acid, Venous: 1.5 mmol/L (ref 0.5–1.9)

## 2024-02-05 MED ORDER — PANTOPRAZOLE SODIUM 40 MG PO TBEC
40.0000 mg | DELAYED_RELEASE_TABLET | Freq: Every day | ORAL | Status: DC
  Administered 2024-02-05 – 2024-02-10 (×6): 40 mg via ORAL
  Filled 2024-02-05 (×6): qty 1

## 2024-02-05 MED ORDER — ONDANSETRON HCL 4 MG/2ML IJ SOLN
4.0000 mg | Freq: Four times a day (QID) | INTRAMUSCULAR | Status: DC | PRN
  Administered 2024-02-06: 4 mg via INTRAVENOUS
  Filled 2024-02-05 (×2): qty 2

## 2024-02-05 MED ORDER — ORAL CARE MOUTH RINSE: 15.0000 mL | OROMUCOSAL | Status: DC | PRN

## 2024-02-05 MED ORDER — HEPARIN (PORCINE) 25000 UT/250ML-% IV SOLN
1000.0000 [IU]/h | INTRAVENOUS | Status: DC
  Administered 2024-02-05: 1000 [IU]/h via INTRAVENOUS
  Filled 2024-02-05: qty 250

## 2024-02-05 MED ORDER — ACETAMINOPHEN 325 MG PO TABS: 650.0000 mg | ORAL_TABLET | ORAL | Status: DC | PRN

## 2024-02-05 MED ORDER — IOHEXOL 350 MG/ML SOLN
100.0000 mL | Freq: Once | INTRAVENOUS | Status: AC | PRN
  Administered 2024-02-05: 100 mL via INTRAVENOUS

## 2024-02-05 MED ORDER — ROSUVASTATIN CALCIUM 5 MG PO TABS
10.0000 mg | ORAL_TABLET | Freq: Every day | ORAL | Status: DC
  Administered 2024-02-05 – 2024-02-09 (×4): 10 mg via ORAL
  Filled 2024-02-05 (×5): qty 2

## 2024-02-05 MED ORDER — FUROSEMIDE 40 MG PO TABS
40.0000 mg | ORAL_TABLET | Freq: Every day | ORAL | Status: DC
  Administered 2024-02-05 – 2024-02-06 (×2): 40 mg via ORAL
  Filled 2024-02-05 (×2): qty 1

## 2024-02-05 MED ORDER — WARFARIN - PHARMACIST DOSING INPATIENT: Freq: Every day | Status: DC

## 2024-02-05 MED ORDER — MAGNESIUM OXIDE -MG SUPPLEMENT 400 (240 MG) MG PO TABS
400.0000 mg | ORAL_TABLET | Freq: Every day | ORAL | Status: DC
  Administered 2024-02-05 – 2024-02-10 (×6): 400 mg via ORAL
  Filled 2024-02-05 (×7): qty 1

## 2024-02-05 MED ORDER — CHLORHEXIDINE GLUCONATE CLOTH 2 % EX PADS
6.0000 | MEDICATED_PAD | Freq: Every day | CUTANEOUS | Status: DC
  Administered 2024-02-05 – 2024-02-09 (×6): 6 via TOPICAL

## 2024-02-05 MED ORDER — POTASSIUM CHLORIDE 20 MEQ PO PACK
20.0000 meq | PACK | Freq: Every day | ORAL | Status: DC
  Administered 2024-02-05 – 2024-02-06 (×2): 20 meq via ORAL
  Filled 2024-02-05 (×3): qty 1

## 2024-02-05 MED ORDER — ENSURE ENLIVE PO LIQD
237.0000 mL | Freq: Two times a day (BID) | ORAL | Status: DC
  Administered 2024-02-05 – 2024-02-10 (×5): 237 mL via ORAL
  Filled 2024-02-05 (×2): qty 237

## 2024-02-05 MED ORDER — ONDANSETRON HCL 4 MG PO TABS: 4.0000 mg | ORAL_TABLET | Freq: Three times a day (TID) | ORAL | Status: DC | PRN

## 2024-02-05 MED ORDER — WARFARIN SODIUM 2 MG PO TABS
2.0000 mg | ORAL_TABLET | Freq: Once | ORAL | Status: AC
  Administered 2024-02-05: 2 mg via ORAL
  Filled 2024-02-05: qty 1

## 2024-02-05 MED ORDER — POTASSIUM CHLORIDE CRYS ER 20 MEQ PO TBCR
20.0000 meq | EXTENDED_RELEASE_TABLET | Freq: Every day | ORAL | Status: DC
  Administered 2024-02-05: 20 meq via ORAL
  Filled 2024-02-05: qty 1

## 2024-02-05 MED ORDER — SILDENAFIL CITRATE 20 MG PO TABS
20.0000 mg | ORAL_TABLET | Freq: Three times a day (TID) | ORAL | Status: DC
  Administered 2024-02-05 – 2024-02-08 (×8): 20 mg via ORAL
  Filled 2024-02-05 (×9): qty 1

## 2024-02-05 NOTE — H&P (Signed)
 VAD TEAM H&P  Cardiology: Dr. Mitzie Anda PCP: Lyle San, MD  Chief complaint: Acute stroke  Follow up for Heart Failure/LVAD:  Mr. Woollard is a 73 y.o. with history of CAD, HTN, atrial fibrillation, CKD IV, VT and chronic systolic CHFs/p HM-3 LVAD on 09/17/21   Patient has had a long-standing cardiomyopathy, EF had been in the 25% range for years.  He had PCI to OM1 in 2007 and RCA in 2013.  From his report, these episodes do not sound like ACS events.  Echo in 12/22 showed EF < 20%, severe LV dilation, restrictive diastolic function, moderate RV dysfunction, moderate MR, mod-severe TR. HF was been complicated by CKD stage 3. He was also noted on device interrogation to have been in atrial fibrillation persistently since 10/22.  He was admitted at [facility] in 12/22.  BP was low, his BP-active meds were stopped and midodrine  was begun.  He was diuresed and discharged home.    Seen in ED earlier in the week for complaint of ICD shock. K and Mg low. ICD interrogated and noted to be EOL so unable to assess if he had shock or not.   At baseline has been doing pretty well. Mowed the grass yesterday.   Wife called EMS this morning after finding him on the ground confused and with evidence of coffee-ground emesis. In ER found to have focal neuro deficits with R vision field cut and dysmetria. Code Stroke activated. CT brain positive for acute to subacute appearing Left PCA and Left cerebellar infarcts. Hyperdense Left PCA suggests occlusion of that vessel. No hemorrhagic transformation or mass effect.   INR 2.2  Seen by Neuro and decision made to treat with IV heparin  and repeat scan in am   Denies CP or SOB. Has no memory of event   PMH: 1. HTN 2. Hyperlipidemia 3. CAD: H/o PCI to OM1 2007 and PCI to RCA in 2013.  Uncertain of circumstances but do not sound like ACS events.  4. CKD stage IV: Patient was on HD for several months post-LVAD but was able to come off.  5. Atrial fibrillation:  Paroxysmal 6. Chronic systolic CHF: Long-standing cardiomyopathy with EF in the 25% range.  Cause uncertain, has history of PCI to RCA and OM1, but degree of CAD does not seem to explain severity of cardiomyopathy. Father had history of CAD/CHF.  Not heavy drinker. Has [employer] ICD.  - Echo (12/22): EF < 20%, severe LV dilation, restrictive diastolic function, moderate RV dysfunction, moderate MR, mod-severe TR.  - 12/22 cardiogenic shock with Impella 5.5 placement then Heartmate 3 LVAD.  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  - Echo (7/24): EF < 20%, IV septum midline to slightly leftward, RV moderately dilated with moderately decreased systolic function, IVC dilated with severe TR. 7. VT - Nausea with amiodarone .  8. RV failure/severe TR 9. Citrobacter UTI (8/23) 10. Left groin abscess 3/24  LVAD Interrogation HM-3: Speed: 5300 Flow: 4.4 PI: 3.3 Power: 3.9w. Personally reviewed   Current Facility-Administered Medications  Medication Dose Route Frequency Provider Last Rate Last Admin   acetaminophen  (TYLENOL ) tablet 650 mg  650 mg Oral Q4H PRN Takerra Lupinacci, Rheta Celestine, MD       ondansetron  (ZOFRAN ) injection 4 mg  4 mg Intravenous Q6H PRN Glenda Spelman, Rheta Celestine, MD       Current Outpatient Medications  Medication Sig Dispense Refill   furosemide  (LASIX ) 20 MG tablet Take 2 tablets (40 mg total)  by mouth daily. 270 tablet 1   magnesium  oxide (MAG-OX) 400 MG tablet Take 1 tablet (400 mg total) by mouth daily. 90 tablet 3   ondansetron  (ZOFRAN ) 4 MG tablet Take 4 mg by mouth every 8 (eight) hours as needed for nausea or vomiting. (Patient not taking: Reported on 01/10/2024)     pantoprazole  (PROTONIX ) 40 MG tablet TAKE 1 TABLET BY MOUTH EVERY DAY 90 tablet 1   potassium chloride  SA (KLOR-CON  M) 20 MEQ tablet Take 1 tablet (20 mEq total) by mouth daily. 90 tablet 3   rosuvastatin  (CRESTOR ) 10 MG tablet TAKE 1 TABLET BY MOUTH EVERY DAY 90 tablet 3    sildenafil  (REVATIO ) 20 MG tablet Take 1 tablet (20 mg total) by mouth 3 (three) times daily. 90 tablet 11   warfarin (COUMADIN ) 2 MG tablet Instructed to take 4 mg (2 tabs) every Monday and 2 mg (1 tab) all other days or as directed by HF Clinic 50 tablet 11    Mushroom extract complex (obsolete), Doxycycline , Neosporin [neomycin-bacitracin zn-polymyx], and Tape  REVIEW OF SYSTEMS: All systems negative except as listed in HPI, PMH and Problem list.  LVAD INTERROGATION:  See LVAD nurse's note above.  I reviewed the LVAD parameters   I reviewed the LVAD parameters from today, and compared the results to the patient's prior recorded data.  No programming changes were made.  The LVAD is functioning within specified parameters.  The patient performs LVAD self-test daily.  LVAD interrogation was negative for any significant power changes, alarms or PI events/speed drops.  LVAD equipment check completed and is in good working order.  Back-up equipment present.   LVAD education done on emergency procedures and precautions and reviewed exit site care.    Vitals:   02/05/24 1111 02/05/24 1132 02/05/24 1200 02/05/24 1230  BP: 98/87  97/74 95/73  Pulse: (!) 53  (!) 51 (!) 48  Resp: 13 19 19  (!) 24  Temp:  98.1 F (36.7 C)    TempSrc:  Oral    SpO2: 97%  100% 90%  Weight:      Height:       MAP 83  Physical Exam: General:  Weak appearing NAD.  Speech slow  HEENT: normal  Neck: supple. JVP not elevated.  Carotids 2+ bilat; no bruits. No lymphadenopathy or thryomegaly appreciated. Cor: LVAD hum.  Lungs: Clear. Abdomen: obese soft, nontender, non-distended. No hepatosplenomegaly. No bruits or masses. Good bowel sounds. Driveline site clean. Anchor in place.  Extremities: no cyanosis, clubbing, rash. Warm no tr edema  Neuro: oriented x 3. Speech latent. + dysmetria ? Mild weakness RUE   ASSESSMENT AND PLAN:  1. 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 Hyperdense Left PCA suggests occlusion of that vessel. No hemorrhagic transformation or mass effect.  - Seen by Neuro and decision made to treat with IV heparin  and repeat scan in am. Goal INR increased 2.5-3.0 - Admit ICU for close monitoring and frequent Neuro checks - Check echo  2. Chronic systolic CHF:  Long-standing cardiomyopathy.  [employer] ICD. S/p HM-3 VAD 09/17/21 Echo in 7/24 showed EF < 20%, IV septum midline to slightly leftward, RV moderately dilated with moderately decreased systolic function, IVC dilated with severe TR.  - NYHA I at baseline - On sildenafil  for RV dysfunction - volume ok - VAD interrogated personally. Parameters stable. - DL ok  - ICD at EOL. Possible  recent ICD shock but unclear  3. Tricuspid regurgitation:  Tricuspid repair not done at time of VAD due to proximity of ICD wires and hypotension during surgery.  He has severe TR.   4. Chronic Atrial fibrillation: - Atrial fibrillation now chronic, he is off amiodarone  with resolution of nausea. - Rate controlled. On warfarin  5. CKD stage IV:  - baseline Scr ~2.5.  - Avoid contrast studies as much as possible (d/w Neuro) - Consider SGLT2i - Follows with nephrology.   6. CAD: History of PCI to OM1 in 2007 and RCA in 2013.   - No s/s angina - continue statin  7. GERD with coffee ground emesis - hgb stable - continue PPI  Maiko Salais 02/05/2024

## 2024-02-05 NOTE — Code Documentation (Addendum)
 Stroke Response Nurse Documentation Code Documentation  Isaac [name] is a 73 y.o. male arriving to [facility]  via [facility] on 01-06-24 with past medical hx of HF has implanted LVAD, . On warfarin daily. Code stroke was activated by ED.   Patient from home where he was LKW at 11pm (5/16) prior to going to bed last night. and now complaining of confusion.  After arrival to ED, some right side weakness noted and difficulty with word finding and right visual deficit.  Code Stroke was then activated.    Stroke team at bedside, Labs drawn and patient cleared for CT by Dr. Manus Sellers. Patient to CT with team. NIHSS 6, see documentation for details and code stroke times. Patient with disoriented, right hemianopia, right leg weakness, and Expressive aphasia  on exam. The following imaging was completed:  CT Head, CTA, and CTP. Patient is not a candidate for IV Thrombolytic due to being on Coumadin  with INR 2.2 and LKW outside TNK window.. Patient is not a candidate for IR due to no LVO on CTA.   Care Plan: VS & NIHSS q 2 hours x 12 hours then q 4 hr.   Bedside handoff with ED RN Alston Jerry.    Isaac [name]  Stroke Response RN

## 2024-02-05 NOTE — Progress Notes (Signed)
 SRN checked in with 2 H RN Beula Brunswick.  Appropriate stroke care and orders in place. This patient requires 2 H expertise.  For any questions related to patient's stroke care, please call:  Stroke Response Nurse (Monday - Friday 0700-1900): [phone]  [address] Nurse (Nights and Weekends): [phone]   Mechele Spiegel RN Stroke Response

## 2024-02-05 NOTE — Progress Notes (Signed)
 PHARMACY - ANTICOAGULATION CONSULT NOTE  Pharmacy Consult for heparin   Indication: LVAD, new acute ischemic infarct   Allergies  Allergen Reactions   Mushroom Extract Complex (Obsolete) Nausea And Vomiting   Doxycycline  Nausea And Vomiting   Neosporin [Neomycin-Bacitracin Zn-Polymyx] Hives   Tape Other (See Comments)    Some tapes/dressings can irritate the skin    Patient Measurements: Height: 5\' 9"  (175.3 cm) Weight: 83.5 kg (184 lb) IBW/kg (Calculated) : 70.7 HEPARIN  DW (KG): 83.5  Vital Signs: Temp: 98.1 F (36.7 C) (05/17 1132) Temp Source: Oral (05/17 1132) BP: 95/73 (05/17 1230) Pulse Rate: 48 (05/17 1230)  Labs: Recent Labs    02/02/24 2010 02/04/24 0000 02/05/24 1106 02/05/24 1200  HGB 13.7  --  13.1 13.9  HCT 42.1  --  40.8 41.0  PLT 200  --  171  --   LABPROT 19.7*  --  24.6*  --   INR 1.6* 2.2 2.2*  --   CREATININE 1.69*  --  2.48* 2.60*    Estimated Creatinine Clearance: 26.1 mL/min (A) (by C-G formula based on SCr of 2.6 mg/dL (H)).   Medical History: Past Medical History:  Diagnosis Date   Arrhythmia    atrial fibrillation   CHF (congestive heart failure) (HCC)    Chronic kidney disease    Coronary artery disease    Hyperlipidemia    Hypertension    Myocardial infarct [facility])    Assessment: Patient admitted with new confusion, noted some vomit that resembled coffee grounds- HgB 13.9 and PLTS 171. Hx of LVAD on warfarin PTA, last dose 5/16 PM per wife. INR today 2.2 - regimen PTA is 4mg  warfarin on Monday and 2mg  all other days. CTA showing L PCA infarct and L cerebellar infarcts.   Plan to resume warfarin once patient is able to take PO, goal INR 2.5 per conversation with neurology, will bridge until at goal.   Goal of Therapy:  Heparin  level 0.3-0.5 units/ml Monitor platelets by anticoagulation protocol: Yes   Plan:  No bolus per neurology.  Start heparin  1000u/hr.  8 hour anti-Xa.  Monitor for s/sx of bleeding - CBC + INR.    Mamie Searles, PharmD, BCCCP  02/05/2024,12:40 PM

## 2024-02-05 NOTE — ED Provider Notes (Signed)
 [place] EMERGENCY DEPARTMENT AT [facility] Provider Note   CSN: [id number] Arrival date & time: 02/05/24  1048     History  Chief Complaint  Patient presents with   Weakness    Isaac [name] is a 73 y.o. male.  The history is provided by the patient. No language interpreter was used.  Weakness Severity:  Moderate Onset quality:  Unable to specify Duration:  12 hours Timing:  Constant Progression:  Unchanged Chronicity:  New Relieved by:  Nothing Worsened by:  Nothing Ineffective treatments:  None tried Associated symptoms: chest pain and vomiting   Associated symptoms: no abdominal pain, no cough, no diarrhea, no difficulty walking, no falls, no fever, no foul-smelling urine, no headaches, no loss of consciousness, no nausea, no near-syncope and no shortness of breath        Home Medications Prior to Admission medications   Medication Sig Start Date End Date Taking? Authorizing Provider  furosemide  (LASIX ) 20 MG tablet Take 2 tablets (40 mg total) by mouth daily. 01/10/24   Darlis Eisenmenger, MD  magnesium  oxide (MAG-OX) 400 MG tablet Take 1 tablet (400 mg total) by mouth daily. 02/03/24   Darlis Eisenmenger, MD  ondansetron  (ZOFRAN ) 4 MG tablet Take 4 mg by mouth every 8 (eight) hours as needed for nausea or vomiting. Patient not taking: Reported on 01/10/2024    [provider]  pantoprazole  (PROTONIX ) 40 MG tablet TAKE 1 TABLET BY MOUTH EVERY DAY 01/26/24   Verona Goodwill, MD  potassium chloride  SA (KLOR-CON  M) 20 MEQ tablet Take 1 tablet (20 mEq total) by mouth daily. 02/03/24 05/03/24  Darlis Eisenmenger, MD  rosuvastatin  (CRESTOR ) 10 MG tablet TAKE 1 TABLET BY MOUTH EVERY DAY 10/21/23   McLean, Dalton S, MD  sildenafil  (REVATIO ) 20 MG tablet Take 1 tablet (20 mg total) by mouth 3 (three) times daily. 01/11/24   Darlis Eisenmenger, MD  warfarin (COUMADIN ) 2 MG tablet Instructed to take 4 mg (2 tabs) every Monday and 2 mg (1 tab) all other days or as  directed by [facility] 11/02/23   Darlis Eisenmenger, MD      Allergies    Mushroom extract complex (obsolete), Doxycycline , Neosporin [neomycin-bacitracin zn-polymyx], and Tape    Review of Systems   Review of Systems  Constitutional:  Negative for chills, fatigue and fever.  HENT:  Negative for congestion.   Respiratory:  Negative for cough, chest tightness and shortness of breath.   Cardiovascular:  Positive for chest pain. Negative for near-syncope.  Gastrointestinal:  Positive for vomiting. Negative for abdominal pain, constipation, diarrhea and nausea.  Genitourinary:  Negative for enuresis and flank pain.  Musculoskeletal:  Negative for back pain, falls, neck pain and neck stiffness.  Skin:  Negative for rash and wound.  Neurological:  Positive for weakness. Negative for loss of consciousness, speech difficulty, light-headedness and headaches.  Psychiatric/Behavioral:  Negative for agitation and confusion.   All other systems reviewed and are negative.   Physical Exam Updated Vital Signs Ht 5\' 9"  (1.753 m)   Wt 83.5 kg   BMI 27.17 kg/m  Physical Exam Vitals and nursing note reviewed.  Constitutional:      General: He is not in acute distress.    Appearance: He is well-developed. He is not ill-appearing, toxic-appearing or diaphoretic.  HENT:     Head: Normocephalic and atraumatic.     Nose: No congestion.     Mouth/Throat:     Pharynx: No  oropharyngeal exudate or posterior oropharyngeal erythema.  Eyes:     Extraocular Movements: Extraocular movements intact.     Conjunctiva/sclera: Conjunctivae normal.     Pupils: Pupils are equal, round, and reactive to light.  Neck:     Vascular: No carotid bruit.  Cardiovascular:     Rate and Rhythm: Normal rate and regular rhythm.     Heart sounds: Murmur heard.  Pulmonary:     Effort: Pulmonary effort is normal. No respiratory distress.     Breath sounds: Normal breath sounds. No wheezing, rhonchi or rales.  Chest:     Chest  wall: No tenderness.  Abdominal:     General: Abdomen is flat. There is no distension.     Palpations: Abdomen is soft.     Tenderness: There is no abdominal tenderness.  Musculoskeletal:        General: No swelling or tenderness.     Cervical back: Neck supple. No tenderness.     Right lower leg: No edema.     Left lower leg: No edema.  Skin:    General: Skin is warm and dry.     Capillary Refill: Capillary refill takes less than 2 seconds.     Findings: No erythema.  Neurological:     Mental Status: He is alert.     Sensory: No sensory deficit.     Motor: No weakness.     Comments: Initially did not appear to have numbness or weakness on my exam but on further subtle exam he did have some slight weakness in right arm compared to left.  Also had right upper visual field diminished compared to other side.  Psychiatric:        Mood and Affect: Mood normal.     ED Results / Procedures / Treatments   Labs (all labs ordered are listed, but only abnormal results are displayed) Labs Reviewed  CBC WITH DIFFERENTIAL/PLATELET - Abnormal; Notable for the following components:      Result Value   RDW 15.9 (*)    Lymphs Abs 0.5 (*)    All other components within normal limits  COMPREHENSIVE METABOLIC PANEL WITH GFR - Abnormal; Notable for the following components:   CO2 21 (*)    Glucose, Bld 106 (*)    BUN 42 (*)    Creatinine, Ser 2.48 (*)    Total Bilirubin 1.5 (*)    GFR, Estimated 27 (*)    All other components within normal limits  PROTIME-INR - Abnormal; Notable for the following components:   Prothrombin Time 24.6 (*)    INR 2.2 (*)    All other components within normal limits  BRAIN NATRIURETIC PEPTIDE - Abnormal; Notable for the following components:   B Natriuretic Peptide 343.8 (*)    All other components within normal limits  APTT - Abnormal; Notable for the following components:   aPTT 41 (*)    All other components within normal limits  I-STAT CHEM 8, ED -  Abnormal; Notable for the following components:   BUN 40 (*)    Creatinine, Ser 2.60 (*)    Glucose, Bld 101 (*)    TCO2 21 (*)    All other components within normal limits  TROPONIN I (HIGH SENSITIVITY) - Abnormal; Notable for the following components:   Troponin I (High Sensitivity) 133 (*)    All other components within normal limits  MAGNESIUM   TSH  ETHANOL  RAPID URINE DRUG SCREEN, HOSP PERFORMED  HEPARIN  LEVEL (UNFRACTIONATED)  I-STAT  CG4 LACTIC ACID, ED  TYPE AND SCREEN  TROPONIN I (HIGH SENSITIVITY)    EKG None  Radiology CT ANGIO HEAD NECK W WO CM W PERF (CODE STROKE) Result Date: 02/05/2024 CLINICAL DATA:  73 year old male with altered mental status and recent appearing left PCA and cerebellar infarcts on plain head CT this morning. EXAM: CT ANGIOGRAPHY HEAD AND NECK CT PERFUSION BRAIN TECHNIQUE: Multidetector CT imaging of the head and neck was performed using the standard protocol during bolus administration of intravenous contrast. Multiplanar CT image reconstructions and MIPs were obtained to evaluate the vascular anatomy. Carotid stenosis measurements (when applicable) are obtained utilizing NASCET criteria, using the distal internal carotid diameter as the denominator. Multiphase CT imaging of the brain was performed following IV bolus contrast injection. Subsequent parametric perfusion maps were calculated using RAPID software. RADIATION DOSE REDUCTION: This exam was performed according to the departmental dose-optimization program which includes automated exposure control, adjustment of the mA and/or kV according to patient size and/or use of iterative reconstruction technique. CONTRAST:  OMNIPAQUE  IOHEXOL  350 MG/ML SOLN COMPARISON:  Plain head CT 1121 hours. FINDINGS: CT Brain Perfusion Findings: ASPECTS: Not applicable, posterior circulation involvement. CBF (<30%) Volume: 0mL. No CBF parameter abnormality. Minimal CBV abnormality at the left occipital pole.  Perfusion (Tmax>6.0s) volume: 41mL, corresponding not only to the broad-based left PCA area of involvement but also anterior right MCA division. Mismatch Volume: Questionably only in the anterior right MCA division, see CTA findings below. Infarction Location:Largest left PCA. CTA NECK Skeleton: Previous sternotomy. Absent mandible dentition. Mild for age cervical spine degeneration. No acute osseous abnormality identified. Upper chest: Left chest cardiac pacemaker. Sequelae of CABG. Some evidence of upper lung centrilobular emphysema. Visible superior mediastinal lymph nodes within normal limits. Other neck: Nonvascular neck soft tissue spaces are within normal limits. Aortic arch: Calcified aortic atherosclerosis.  3 vessel arch. Right carotid system: Patent with widespread but mild soft plaque from the brachiocephalic artery through the ICA. Mild additional calcified plaque at the right ICA origin or bulb. No hemodynamically significant stenosis. Left carotid system: Similar widespread but generally mild soft plaque. Mild to moderate soft and calcified plaque at the distal bulb. No significant stenosis to the skull base. Vertebral arteries: Proximal right subclavian artery is patent with mild atherosclerosis. Calcified plaque at the right vertebral artery origin without stenosis. Paravertebral venous contrast contamination but the right vertebral artery appears normal to the skull base. Proximal left subclavian soft and calcified plaque without stenosis. Plaque at the left vertebral artery origin but no convincing stenosis. Paravertebral venous contrast contamination is greater on this side but the visible left vertebral artery is patent and normal to the skull base. Codominant CTA HEAD Posterior circulation: Codominant distal vertebral arteries and vertebrobasilar junction are patent without plaque or stenosis. Mildly fenestrated vertebrobasilar junction, fenestrated vertebrobasilar junction is a normal variant.  Bilateral PICA appear diminutive, AICA probably dominant. But the left PICA is patent. Patent basilar artery with mild irregularity. Patent SCA and PCA origins. Right PCA branches are within normal limits. Posterior communicating arteries are diminutive or absent. Left PCA is patent until the P2/P3 junction, where there is near occlusion but there is some faint distal PCA branch enhancement. See series 12, image 25. Anterior circulation: Both ICA siphons are patent. Left siphon calcified cavernous segment atherosclerosis with moderate stenosis series 8, image 87. Contralateral right siphon similar distal cavernous and anterior genu calcified plaque with moderate to severe stenosis on series 8, image 81. Additional mild to moderate right  supraclinoid stenosis. Patent carotid termini, MCA and ACA origins. Anterior communicating artery, bilateral ACA branches are within normal limits. Left MCA M1 segment and trifurcation are patent without stenosis. Right MCA M1 segment and trifurcation are patent without stenosis. Bilateral MCA branches are within normal limits. No anterior right MCA division branch occlusion is identified. Venous sinuses: Early contrast timing, grossly patent. Anatomic variants: Fenestrated vertebrobasilar junction. Review of the MIP images confirms the above findings IMPRESSION: 1. CTP does not detect the known Left PCA infarct core (although T-max oligemia corresponds to that finding), while CTA demonstrates subtotal occlusion of the Left PCA at the P2/P3 junction (some distal flow). 2. CTP also suggests Oligemia in the anterior Right MCA division, although no MCA branch occlusion identified by CTA. 3. The above were discussed by telephone with Dr. Janett Medin on 02/05/2024 at 12:36 . 4. Additionally, Moderate to Severe bilateral ICA siphon atherosclerosis with at least Moderate siphon stenosis bilaterally (Moderate to Severe at the Right anterior genu). 5. Extracranial atherosclerosis but no significant  Vertebral artery or extracranial carotid stenosis. 6. Aortic Atherosclerosis (ICD10-I70.0) and Emphysema (ICD10-J43.9). Electronically Signed   By: Marlise Simpers M.D.   On: 02/05/2024 12:38   DG Chest Portable 1 View Result Date: 02/05/2024 CLINICAL DATA:  Altered level of consciousness, emesis EXAM: PORTABLE CHEST 1 VIEW COMPARISON:  02/02/2024 FINDINGS: Single frontal view of the chest demonstrates stable left ventricular assist device and multi lead pacer/AICD. Cardiac silhouette remains enlarged. No acute airspace disease, effusion, or pneumothorax. No acute bony abnormalities. IMPRESSION: 1. Stable support devices. 2. No acute intrathoracic process. Electronically Signed   By: Bobbye Burrow M.D.   On: 02/05/2024 11:57   CT HEAD WO CONTRAST ( ) Addendum Date: 02/05/2024 ADDENDUM REPORT: 02/05/2024 11:54 ADDENDUM: Study discussed by telephone with Dr. Paris Bolds on 02/05/2024 1150 hours. Electronically Signed   By: Marlise Simpers M.D.   On: 02/05/2024 11:54   Result Date: 02/05/2024 CLINICAL DATA:  73 year old male with altered mental status. Vomiting and confusion. EXAM: CT HEAD WITHOUT CONTRAST TECHNIQUE: Contiguous axial images were obtained from the base of the skull through the vertex without intravenous contrast. RADIATION DOSE REDUCTION: This exam was performed according to the departmental dose-optimization program which includes automated exposure control, adjustment of the mA and/or kV according to patient size and/or use of iterative reconstruction technique. COMPARISON:  None Available. FINDINGS: Brain: No midline shift, mass effect, or evidence of intracranial mass lesion. No ventriculomegaly. No acute intracranial hemorrhage identified. Small but circumscribed, chronic appearing lacunar infarcts in the left caudate, right lentiform, left thalamus. Patchy additional moderate bilateral cerebral white matter hypodensity, asymmetric. Indistinct linear, wedge-shaped hypodensity in the left  cerebellar hemisphere series 2, image 8 and series 4, image 15. other posterior fossa gray-white differentiation is within normal limits. No posterior fossa mass effect. Furthermore, there is similar indistinct cytotoxic edema in the left PCA territory, much of the medial left occipital lobe is affected. No associated hemorrhage or mass effect. Vascular: Questionable asymmetric hyperdensity of the distal left vertebral artery. Asymmetric hyperdense left PCA. Skull: Intact.  No acute osseous abnormality identified. Sinuses/Orbits: Mild to moderate bilateral paranasal sinus mucosal thickening. No layering sinus fluid. Tympanic cavities and mastoids well aerated. Other: Right supraorbital, forehead soft tissue swelling and trace soft tissue gas suggesting injury on series 3, image 18. Underlying right frontal bone intact, hypoplastic right frontal sinus. Other visible scalp and orbits soft tissues are within normal limits. IMPRESSION: 1. Positive for acute to subacute appearing Left PCA and Left  cerebellar infarcts. Hyperdense Left PCA suggests occlusion of that vessel. No hemorrhagic transformation or mass effect. 2. Underlying advanced chronic small vessel disease. 3. Right supraorbital/forehead recent soft tissue injury with no skull fracture identified. Electronically Signed: By: Marlise Simpers M.D. On: 02/05/2024 11:41    Procedures Procedures    CRITICAL CARE Performed by: Marine Sia Azazel Franze Total critical care time: 45 minutes Critical care time was exclusive of separately billable procedures and treating other patients. Critical care was necessary to treat or prevent imminent or life-threatening deterioration. Critical care was time spent personally by me on the following activities: development of treatment plan with patient and/or surrogate as well as nursing, discussions with consultants, evaluation of patient's response to treatment, examination of patient, obtaining history from patient or  surrogate, ordering and performing treatments and interventions, ordering and review of laboratory studies, ordering and review of radiographic studies, pulse oximetry and re-evaluation of patient's condition.  Medications Ordered in ED Medications  acetaminophen  (TYLENOL ) tablet 650 mg (has no administration in time range)  ondansetron  (ZOFRAN ) injection 4 mg (has no administration in time range)  heparin  ADULT infusion 100 units/mL (25000 units/250mL) (has no administration in time range)  iohexol  (OMNIPAQUE ) 350 MG/ML injection 100 mL (100 mLs Intravenous Contrast Given 02/05/24 1220)    ED Course/ Medical Decision Making/ A&P                                 Medical Decision Making Amount and/or Complexity of Data Reviewed Labs: ordered. Radiology: ordered.  Risk Prescription drug management. Decision regarding hospitalization.    Isaac [name] is a 73 y.o. male with past medical history significant for CHF with LVAD, atrial fibrillation on Coumadin  therapy, recent ICD discharge this week, thyroid  disease, hypertension and CAD who presents for altered mental status and vomiting.  According to EMS, patient was found to have vomited a dark appearing emesis on the floor overnight and is acting confused today.  Patient is denying complaints but EMS was reporting that the wife was very concerned about the patient's mental status.  EMS reported he did not have hypoglycemia and otherwise had no complaints for them.  Patient is denying chest pain, shortness of breath, constipation, diarrhea, abdominal pain, nausea, or vomiting now.  Denies any urinary changes.  Denies any leg pain or leg swelling.  Denies any other complaints.  On my exam, lungs were clear.  Chest was nontender and he does have the wearing LVAD sound audible.  Abdomen was not focally tender.  Patient reports is not distended.  He was moving all extremities.  Answering questions appropriately.    Given the patient's  history of recent electrolyte disturbances and ICD discharge, LVAD team will come see patient.  They called as he was arriving to come see him.  Will get screening labs to look for changes with this.  Will get a CT of the head given his blood thinner use and altered mental status.  Will get a chest x-ray.  Will see what workup shows but patient may end up needing abdominal imaging given the EMS report that patient has some distended abdomen although patient is denying it and denying pain now.  Patient was last normal at 11 PM per EMS report.  Family has not yet arrived.  Anticipate reassessment after workup to determine disposition.  11:10 AM LVAD coordinator has arrived and now patient is saying he was having chest pain and  shortness of breath earlier.  He adamantly denied it to me initially but is now admitting it.  We will add on a troponin and BNP as recommended by the LVAD team as well.  She reports that she does know the patient and he does not seem as mental status baseline.  12:00 PM CT head returned showing concern for stroke.  Dr. Bonnita Buttner with neurology was here near the patient's room so I quickly had him come evaluate the patient.  He is concerned that the patient does have evidence of stroke.  On more subtle exam patient does have a right field cut, possibly has some right facial droop, and is looking more to the left.  Family also now arrived and says that he has some weakness in his right arm that is new compared to yesterday.  Patient reportedly is able to mow the grass normally yesterday in the daytime.  Per Dr. Evalee Hila with neurology, he requested activation of code stroke to get the CTA head and neck and perfusion study.  Patient cannot get MRI due to his LVAD.  1:06 PM Patient's imaging does not did show evidence of stroke.  Neurology will follow and patient will be admitted by cardiology for further management of acute stroke.         Final Clinical Impression(s) / ED  Diagnoses Final diagnoses:  Cerebrovascular accident (CVA), unspecified mechanism (HCC)      Clinical Impression: 1. Cerebrovascular accident (CVA), unspecified mechanism (HCC)     Disposition: Admit  This note was prepared with assistance of Dragon voice recognition software. Occasional wrong-word or sound-a-like substitutions may have occurred due to the inherent limitations of voice recognition software.     Sharran Caratachea, Marine Sia, MD 02/05/24 1314

## 2024-02-05 NOTE — ED Notes (Signed)
 At CT at 1157

## 2024-02-05 NOTE — Progress Notes (Signed)
 Reason for Consult:Code stroke Referring Physician: Wynell Heath  HPI is an 73 y.o. male.  With past medical history of coronary artery disease, hypertension, atrial fibrillation, chronic kidney disease, ventricular tachycardia, chronic systolic heart failure status post LVAD on 09/17/2021 on long-term anticoagulation.  Patient presents to the ER today when his wife called EMS this morning after family being him on the ground and confused with evidence of coffee-ground emesis.  He was found to have focal deficits by EMS in the form of right-sided visual field cut and sent dysmetria and weakness in the right side.  Code stroke was activated in the field.  CT scan showed as early infarct in the left PCA distribution as well as left cerebellar with hyperdense left PCA suggestive of occlusion of blood pressure.  Emergent CT angiogram was also obtained which showed left P2/P3 occlusion with some and distal flow as well as moderate calcified atherosclerosis in carotid siphons bilaterally.  CT perfusion scan showed no left PCA infarct core with oligemia in the left PCA distribution.  There is also oligemia noted in the right anterior MCA branch division but no vessel occlusion identified on previous formal CT angiogram.   Case was discussed with Dr. Alvira Josephs neurointerventional radiologist on-call was felt patient did not have an occlusion amenable to endovascular treatment.   Patient was already on Coumadin  for LVAD device with INR of 2.1 and he was not a candidate for thrombolysis plus he presented outside time window for thrombolysis. Last known well : 11 pm on 02/04/24 IV TNK ; no as outside window and on warfarin IR considered no as PCA occlusion too distal    Past Medical History:  Diagnosis Date   Arrhythmia    atrial fibrillation   CHF (congestive heart failure) (HCC)    Chronic kidney disease    Coronary artery disease    Hyperlipidemia    Hypertension    Myocardial infarct [facility])     Past  Surgical History:  Procedure Laterality Date   CARDIAC DEFIBRILLATOR PLACEMENT  feb 2014   INSERTION OF IMPLANTABLE LEFT VENTRICULAR ASSIST DEVICE N/A 09/17/2021   Procedure: INSERTION OF IMPLANTABLE LEFT VENTRICULAR ASSIST DEVICE AND INSERTION OF FEMORAL ARTERIAL LINE;  Surgeon: Bartley Lightning, MD;  Location: [facility] OR;  Service: Open Heart Surgery;  Laterality: N/A;   IR FLUORO GUIDE CV LINE RIGHT  09/27/2021   IR THORACENTESIS ASP PLEURAL SPACE W/IMG GUIDE  09/27/2021   IR US  GUIDE VASC ACCESS RIGHT  09/27/2021   PLACEMENT OF IMPELLA LEFT VENTRICULAR ASSIST DEVICE N/A 09/12/2021   Procedure: PLACEMENT OF IMPELLA 5.5 LEFT VENTRICULAR ASSIST DEVICE;  Surgeon: Zelphia Higashi, MD;  Location: [facility] OR;  Service: Open Heart Surgery;  Laterality: N/A;   RIGHT HEART CATH N/A 09/08/2021   Procedure: RIGHT HEART CATH;  Surgeon: Darlis Eisenmenger, MD;  Location: [facility] INVASIVE CV LAB;  Service: Cardiovascular;  Laterality: N/A;   TEE WITHOUT CARDIOVERSION N/A 09/12/2021   Procedure: TRANSESOPHAGEAL ECHOCARDIOGRAM (TEE);  Surgeon: Zelphia Higashi, MD;  Location: [facility] OR;  Service: Open Heart Surgery;  Laterality: N/A;   TEE WITHOUT CARDIOVERSION N/A 09/17/2021   Procedure: TRANSESOPHAGEAL ECHOCARDIOGRAM (TEE);  Surgeon: Bartley Lightning, MD;  Location: [facility] OR;  Service: Open Heart Surgery;  Laterality: N/A;    Family History  Problem Relation Age of Onset   Hypertension Mother    Hypertension Father    Hyperlipidemia Father    Heart attack Father    Heart failure Father  Social History:  reports that he has quit smoking. He has never used smokeless tobacco. He reports that he does not drink alcohol and does not use drugs.  Allergies:  Allergies  Allergen Reactions   Mushroom Extract Complex (Obsolete) Nausea And Vomiting   Doxycycline  Nausea And Vomiting   Neosporin [Neomycin-Bacitracin Zn-Polymyx] Hives   Tape Other (See Comments)    Some tapes/dressings can irritate the skin     Medications: I have reviewed the patient's current medications. Prior to Admission: (Not in a hospital admission)   Results for orders placed or performed during the hospital encounter of 02/05/24 (from the past 48 hours)  CBC with Differential     Status: Abnormal   Collection Time: 02/05/24 11:06 AM  Result Value Ref Range   WBC 6.2 4.0 - 10.5 K/uL   RBC 4.43 4.22 - 5.81 MIL/uL   Hemoglobin 13.1 13.0 - 17.0 g/dL   HCT 40.9 81.1 - 91.4 %   MCV 92.1 80.0 - 100.0 fL   MCH 29.6 26.0 - 34.0 pg   MCHC 32.1 30.0 - 36.0 g/dL   RDW 78.2 (H) 95.6 - 21.3 %   Platelets 171 150 - 400 K/uL   nRBC 0.0 0.0 - 0.2 %   Neutrophils Relative % 72 %   Neutro Abs 4.5 1.7 - 7.7 K/uL   Lymphocytes Relative 8 %   Lymphs Abs 0.5 (L) 0.7 - 4.0 K/uL   Monocytes Relative 14 %   Monocytes Absolute 0.9 0.1 - 1.0 K/uL   Eosinophils Relative 5 %   Eosinophils Absolute 0.3 0.0 - 0.5 K/uL   Basophils Relative 1 %   Basophils Absolute 0.0 0.0 - 0.1 K/uL   Immature Granulocytes 0 %   Abs Immature Granulocytes 0.01 0.00 - 0.07 K/uL    Comment: Performed at [facility] Lab, [address], [place], [place] [address]  Comprehensive metabolic panel     Status: Abnormal   Collection Time: 02/05/24 11:06 AM  Result Value Ref Range   Sodium 138 135 - 145 mmol/L   Potassium 4.2 3.5 - 5.1 mmol/L   Chloride 108 98 - 111 mmol/L   CO2 21 (L) 22 - 32 mmol/L   Glucose, Bld 106 (H) 70 - 99 mg/dL    Comment: Glucose reference range applies only to samples taken after fasting for at least 8 hours.   BUN 42 (H) 8 - 23 mg/dL   Creatinine, Ser 8.46 (H) 0.61 - 1.24 mg/dL   Calcium  9.3 8.9 - 10.3 mg/dL   Total Protein 6.8 6.5 - 8.1 g/dL   Albumin  3.5 3.5 - 5.0 g/dL   AST 26 15 - 41 U/L   ALT 21 0 - 44 U/L   Alkaline Phosphatase 92 38 - 126 U/L   Total Bilirubin 1.5 (H) 0.0 - 1.2 mg/dL   GFR, Estimated 27 (L) >60 mL/min    Comment: (NOTE) Calculated using the CKD-EPI Creatinine Equation (2021)    Anion gap 9 5 -  15    Comment: Performed at [facility], [address], [place], [place] [address]  Protime-INR     Status: Abnormal   Collection Time: 02/05/24 11:06 AM  Result Value Ref Range   Prothrombin Time 24.6 (H) 11.4 - 15.2 seconds   INR 2.2 (H) 0.8 - 1.2    Comment: (NOTE) INR goal varies based on device and disease states. Performed at [facility] Lab, [address] [place], [place] [address]  Magnesium      Status: None   Collection Time: 02/05/24 11:06 AM  Result Value Ref Range   Magnesium  2.0 1.7 - 2.4 mg/dL    Comment: Performed at [facility] Lab, [address] [place], [place] [address]  Type and screen [facility]     Status: None   Collection Time: 02/05/24 11:06 AM  Result Value Ref Range   ABO/RH(D) O POS    Antibody Screen NEG    Sample Expiration      02/08/2024,2359 Performed at [facility], [address], [place], [place] [address]   Troponin I (High Sensitivity)     Status: Abnormal   Collection Time: 02/05/24 11:06 AM  Result Value Ref Range   Troponin I (High Sensitivity) 133 (HH) <18 ng/L    Comment: CRITICAL RESULT CALLED TO, READ BACK BY AND VERIFIED WITH CARRALERO. M RN @1242  02/05/24 S OUROBANGNA (NOTE) Elevated high sensitivity troponin I (hsTnI) values and significant  changes across serial measurements may suggest ACS but many other  chronic and acute conditions are known to elevate hsTnI results.  Refer to the "Links" section for chest pain algorithms and additional  guidance. Performed at [facility], [address] [place], [place] [address]   Brain natriuretic peptide     Status: Abnormal   Collection Time: 02/05/24 11:06 AM  Result Value Ref Range   B Natriuretic Peptide 343.8 (H) 0.0 - 100.0 pg/mL    Comment: Performed at [facility] Lab, [address] [place], [place] [address]  APTT     Status: Abnormal   Collection Time: 02/05/24 11:06 AM  Result Value Ref Range   aPTT 41 (H) 24 - 36  seconds    Comment:        IF BASELINE aPTT IS ELEVATED, SUGGEST PATIENT RISK ASSESSMENT BE USED TO DETERMINE APPROPRIATE ANTICOAGULANT THERAPY. Performed at [facility] Lab, [address] [place], [place] [address]   I-Stat CG4 Lactic Acid     Status: None   Collection Time: 02/05/24 11:11 AM  Result Value Ref Range   Lactic Acid, Venous 1.5 0.5 - 1.9 mmol/L  I-stat chem 8, ED     Status: Abnormal   Collection Time: 02/05/24 12:00 PM  Result Value Ref Range   Sodium 141 135 - 145 mmol/L   Potassium 4.3 3.5 - 5.1 mmol/L   Chloride 108 98 - 111 mmol/L   BUN 40 (H) 8 - 23 mg/dL   Creatinine, Ser 5.28 (H) 0.61 - 1.24 mg/dL   Glucose, Bld 413 (H) 70 - 99 mg/dL    Comment: Glucose reference range applies only to samples taken after fasting for at least 8 hours.   Calcium , Ion 1.20 1.15 - 1.40 mmol/L   TCO2 21 (L) 22 - 32 mmol/L   Hemoglobin 13.9 13.0 - 17.0 g/dL   HCT 24.4 01.0 - 27.2 %    CT ANGIO HEAD NECK W WO CM W PERF (CODE STROKE) Result Date: 02/05/2024 CLINICAL DATA:  73 year old male with altered mental status and recent appearing left PCA and cerebellar infarcts on plain head CT this morning. EXAM: CT ANGIOGRAPHY HEAD AND NECK CT PERFUSION BRAIN TECHNIQUE: Multidetector CT imaging of the head and neck was performed using the standard protocol during bolus administration of intravenous contrast. Multiplanar CT image reconstructions and MIPs were obtained to evaluate the vascular anatomy. Carotid stenosis measurements (when applicable) are obtained utilizing NASCET criteria, using the distal internal  carotid diameter as the denominator. Multiphase CT imaging of the brain was performed following IV bolus contrast injection. Subsequent parametric perfusion maps were calculated using RAPID software. RADIATION DOSE REDUCTION: This exam was performed according to the departmental dose-optimization program which includes automated exposure control, adjustment of the mA and/or kV  according to patient size and/or use of iterative reconstruction technique. CONTRAST:  OMNIPAQUE  IOHEXOL  350 MG/ML SOLN COMPARISON:  Plain head CT 1121 hours. FINDINGS: CT Brain Perfusion Findings: ASPECTS: Not applicable, posterior circulation involvement. CBF (<30%) Volume: 0mL. No CBF parameter abnormality. Minimal CBV abnormality at the left occipital pole. Perfusion (Tmax>6.0s) volume: 41mL, corresponding not only to the broad-based left PCA area of involvement but also anterior right MCA division. Mismatch Volume: Questionably only in the anterior right MCA division, see CTA findings below. Infarction Location:Largest left PCA. CTA NECK Skeleton: Previous sternotomy. Absent mandible dentition. Mild for age cervical spine degeneration. No acute osseous abnormality identified. Upper chest: Left chest cardiac pacemaker. Sequelae of CABG. Some evidence of upper lung centrilobular emphysema. Visible superior mediastinal lymph nodes within normal limits. Other neck: Nonvascular neck soft tissue spaces are within normal limits. Aortic arch: Calcified aortic atherosclerosis.  3 vessel arch. Right carotid system: Patent with widespread but mild soft plaque from the brachiocephalic artery through the ICA. Mild additional calcified plaque at the right ICA origin or bulb. No hemodynamically significant stenosis. Left carotid system: Similar widespread but generally mild soft plaque. Mild to moderate soft and calcified plaque at the distal bulb. No significant stenosis to the skull base. Vertebral arteries: Proximal right subclavian artery is patent with mild atherosclerosis. Calcified plaque at the right vertebral artery origin without stenosis. Paravertebral venous contrast contamination but the right vertebral artery appears normal to the skull base. Proximal left subclavian soft and calcified plaque without stenosis. Plaque at the left vertebral artery origin but no convincing stenosis. Paravertebral venous  contrast contamination is greater on this side but the visible left vertebral artery is patent and normal to the skull base. Codominant CTA HEAD Posterior circulation: Codominant distal vertebral arteries and vertebrobasilar junction are patent without plaque or stenosis. Mildly fenestrated vertebrobasilar junction, fenestrated vertebrobasilar junction is a normal variant. Bilateral PICA appear diminutive, AICA probably dominant. But the left PICA is patent. Patent basilar artery with mild irregularity. Patent SCA and PCA origins. Right PCA branches are within normal limits. Posterior communicating arteries are diminutive or absent. Left PCA is patent until the P2/P3 junction, where there is near occlusion but there is some faint distal PCA branch enhancement. See series 12, image 25. Anterior circulation: Both ICA siphons are patent. Left siphon calcified cavernous segment atherosclerosis with moderate stenosis series 8, image 87. Contralateral right siphon similar distal cavernous and anterior genu calcified plaque with moderate to severe stenosis on series 8, image 81. Additional mild to moderate right supraclinoid stenosis. Patent carotid termini, MCA and ACA origins. Anterior communicating artery, bilateral ACA branches are within normal limits. Left MCA M1 segment and trifurcation are patent without stenosis. Right MCA M1 segment and trifurcation are patent without stenosis. Bilateral MCA branches are within normal limits. No anterior right MCA division branch occlusion is identified. Venous sinuses: Early contrast timing, grossly patent. Anatomic variants: Fenestrated vertebrobasilar junction. Review of the MIP images confirms the above findings IMPRESSION: 1. CTP does not detect the known Left PCA infarct core (although T-max oligemia corresponds to that finding), while CTA demonstrates subtotal occlusion of the Left PCA at the P2/P3 junction (some distal flow). 2. CTP also  suggests Oligemia in the anterior  Right MCA division, although no MCA branch occlusion identified by CTA. 3. The above were discussed by telephone with Dr. Janett Medin on 02/05/2024 at 12:36 . 4. Additionally, Moderate to Severe bilateral ICA siphon atherosclerosis with at least Moderate siphon stenosis bilaterally (Moderate to Severe at the Right anterior genu). 5. Extracranial atherosclerosis but no significant Vertebral artery or extracranial carotid stenosis. 6. Aortic Atherosclerosis (ICD10-I70.0) and Emphysema (ICD10-J43.9). Electronically Signed   By: Marlise Simpers M.D.   On: 02/05/2024 12:38   DG Chest Portable 1 View Result Date: 02/05/2024 CLINICAL DATA:  Altered level of consciousness, emesis EXAM: PORTABLE CHEST 1 VIEW COMPARISON:  02/02/2024 FINDINGS: Single frontal view of the chest demonstrates stable left ventricular assist device and multi lead pacer/AICD. Cardiac silhouette remains enlarged. No acute airspace disease, effusion, or pneumothorax. No acute bony abnormalities. IMPRESSION: 1. Stable support devices. 2. No acute intrathoracic process. Electronically Signed   By: Bobbye Burrow M.D.   On: 02/05/2024 11:57   CT HEAD WO CONTRAST ( ) Addendum Date: 02/05/2024 ADDENDUM REPORT: 02/05/2024 11:54 ADDENDUM: Study discussed by telephone with Dr. Paris Bolds on 02/05/2024 1150 hours. Electronically Signed   By: Marlise Simpers M.D.   On: 02/05/2024 11:54   Result Date: 02/05/2024 CLINICAL DATA:  73 year old male with altered mental status. Vomiting and confusion. EXAM: CT HEAD WITHOUT CONTRAST TECHNIQUE: Contiguous axial images were obtained from the base of the skull through the vertex without intravenous contrast. RADIATION DOSE REDUCTION: This exam was performed according to the departmental dose-optimization program which includes automated exposure control, adjustment of the mA and/or kV according to patient size and/or use of iterative reconstruction technique. COMPARISON:  None Available. FINDINGS: Brain: No midline shift, mass  effect, or evidence of intracranial mass lesion. No ventriculomegaly. No acute intracranial hemorrhage identified. Small but circumscribed, chronic appearing lacunar infarcts in the left caudate, right lentiform, left thalamus. Patchy additional moderate bilateral cerebral white matter hypodensity, asymmetric. Indistinct linear, wedge-shaped hypodensity in the left cerebellar hemisphere series 2, image 8 and series 4, image 15. other posterior fossa gray-white differentiation is within normal limits. No posterior fossa mass effect. Furthermore, there is similar indistinct cytotoxic edema in the left PCA territory, much of the medial left occipital lobe is affected. No associated hemorrhage or mass effect. Vascular: Questionable asymmetric hyperdensity of the distal left vertebral artery. Asymmetric hyperdense left PCA. Skull: Intact.  No acute osseous abnormality identified. Sinuses/Orbits: Mild to moderate bilateral paranasal sinus mucosal thickening. No layering sinus fluid. Tympanic cavities and mastoids well aerated. Other: Right supraorbital, forehead soft tissue swelling and trace soft tissue gas suggesting injury on series 3, image 18. Underlying right frontal bone intact, hypoplastic right frontal sinus. Other visible scalp and orbits soft tissues are within normal limits. IMPRESSION: 1. 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 Hyperdense Left PCA suggests occlusion of that vessel. No hemorrhagic transformation or mass effect. 2. Underlying advanced chronic small vessel disease. 3. Right supraorbital/forehead recent soft tissue injury with no skull fracture identified. Electronically Signed: By: Marlise Simpers M.D. On: 02/05/2024 11:41   CT-scan of the brain MRI examination of the brain.   CT-scan of the brain  Ultrasound   ROS Blood pressure 95/73, pulse (!) 48, temperature 98.1 F (36.7 C), temperature source Oral, resp. rate (!) 24, height 5\' 9"  (1.753 m), weight  83.5 kg, SpO2 90%. Pleasant elderly African-American male not in distress. Cardiac exam LVAD hum noted. Clear to auscultation. Soft nontender Neurological  exam patient is drowsy but easily arousable.  Follows commands intermittently.  Speech is nonfluent with slight dysarthria.  Will comprehend 1 and occasional two-step commands..  Slight difficulty with naming and repetition.  Motor system exam shows slight right lower extremity drift.  Diminished fine finger movements on the right.  Slight impaired coordination on the right.  Sensation appears slightly diminished on the right with light injury and intention.  Gait not tested.  Physical Exam 1a Level of Conscious.: 1 1b LOC Questions: 1 1c LOC Commands: 1 2 Best Gaze: 1 3 Visual: 2 4 Facial Palsy: 1 5a Motor Arm - left: 0 5b Motor Arm - Right:  6a Motor Leg - Left: 0 6b Motor Leg - Right: 1 7 Limb Ataxia: 1 8 Sensory: 0 9 Best Language: 1 10 Dysarthria: 0 11 Extinct. and Inatten.: 1 [id number]  Assessment : 73 year old African-American male with history of atrial fibrillation and cardiomyopathy with LVAD device on long-term anticoagulation with Coumadin  presents with sudden onset of altered mental status and neurological exam with history of left PCA and cerebellar infarct.  He has presented outside time window for thrombolysis and CT angiogram suggests distal left embolic PCA occlusion with  CT already show  evolving infarct making him not have good candidate for mechanical thrombectomy at this time. Recommend : Admit to heart failure team for further evaluation and treatment.  Recommend IV heparin  drip stroke protocol for bridging and aim for a higher INR goal of 2.5-3.  Check echocardiogram, lipid profile, hemoglobin A1c.  Patient will not be able to have an MRI due to his LVAD device.  Recommend follow-up CT scan in 24 hours.  Mobilize out of bed.  Physical occupational and speech therapy consults.  Bedside RN follow-up eval and resume  home medications if able to swallow safely.  Long discussion with patient and wife at the bedside and answered questions.  Discussed with Dr. Bensimhon.  Discussed with Dr. Manus Sellers ER MD. This patient is critically ill and at significant risk of neurological worsening, death and care requires constant monitoring of vital signs, hemodynamics,respiratory and cardiac monitoring, extensive review of multiple databases, frequent neurological assessment, discussion with family, other specialists and medical decision making of high complexity.I have made any additions or clarifications directly to the above note.This critical care time does not reflect procedure time, or teaching time or supervisory time of PA/NP/Med Resident etc but could involve care discussion time.  I spent 30 minutes of neurocritical care time  in the care of  this patient.     Ardella Beaver 02/05/2024, 1:26 PM    Note: This document was prepared with digital dictation and possible smart phrase technology. Any transcriptional errors that result from this process are unintentional.

## 2024-02-05 NOTE — Progress Notes (Addendum)
 PHARMACY - ANTICOAGULATION CONSULT NOTE  Pharmacy Consult for heparin   Indication: LVAD, new acute ischemic infarct   Allergies  Allergen Reactions   Mushroom Extract Complex (Obsolete) Nausea And Vomiting   Doxycycline  Nausea And Vomiting   Neosporin [Neomycin-Bacitracin Zn-Polymyx] Hives   Tape Other (See Comments)    Some tapes/dressings can irritate the skin    Patient Measurements: Height: 5\' 9"  (175.3 cm) Weight: 83.5 kg (184 lb) IBW/kg (Calculated) : 70.7 HEPARIN  DW (KG): 83.5  Vital Signs: Temp: 98.2 F (36.8 C) (05/17 1630) Temp Source: Axillary (05/17 1630) BP: 95/73 (05/17 1230) Pulse Rate: 141 (05/17 1515)  Labs: Recent Labs    02/02/24 2010 02/04/24 0000 02/05/24 1106 02/05/24 1200 02/05/24 1428  HGB 13.7  --  13.1 13.9  --   HCT 42.1  --  40.8 41.0  --   PLT 200  --  171  --   --   APTT  --   --  41*  --   --   LABPROT 19.7*  --  24.6*  --   --   INR 1.6* 2.2 2.2*  --   --   CREATININE 1.69*  --  2.48* 2.60*  --   TROPONINIHS  --   --  133*  --  185*    Estimated Creatinine Clearance: 26.1 mL/min (A) (by C-G formula based on SCr of 2.6 mg/dL (H)).   Medical History: Past Medical History:  Diagnosis Date   Arrhythmia    atrial fibrillation   CHF (congestive heart failure) (HCC)    Chronic kidney disease    Coronary artery disease    Hyperlipidemia    Hypertension    Myocardial infarct [facility])    Assessment: Patient admitted with new confusion, noted some vomit that resembled coffee grounds- HgB 13.9 and PLTS 171. Hx of LVAD on warfarin PTA, last dose 5/16 PM per wife. INR today 2.2 - regimen PTA is 4mg  warfarin on Monday and 2mg  all other days. CTA showing L PCA infarct and L cerebellar infarcts.   Plan to resume warfarin once patient is able to take PO, goal INR 2.5 per conversation with neurology, will bridge until at goal.   Goal of Therapy:  Heparin  level 0.3-0.5 units/ml Monitor platelets by anticoagulation protocol: Yes   Plan:   No bolus per neurology.  Start heparin  1000u/hr.  8 hour anti-Xa.  Monitor for s/sx of bleeding - CBC + INR.   5/17 PM update (1708 PM): discussed with HF, will plan to restart warfarin this evening (passed swallow).  PTA warfarin regimen (INR goal 2-2.5)= 4 mg Mon, 2 mg AOD (INR 2.2 at last anticoag visit on 5/16).   -Give warfarin 2 mg PO x 1 -Daily INR along with heparin  levels/CBC  Cecillia Cogan, PharmD Clinical Pharmacist 02/05/2024  5:08 PM __________________  5/17 PM - heparin  level 0.2, below goal.  Will hold off on further up-titration given presentation with coffee ground emesis - no overt s/sx bleeding per RN.  F/u repeat CT scan tomorrow.  Cecillia Cogan, PharmD Clinical Pharmacist 02/05/2024  10:09 PM

## 2024-02-05 NOTE — Progress Notes (Addendum)
 LVAD Coordinator ED Encounter  Isaac [name] a 73 y.o. male that presented to [facility] ER today due to altered mental status. 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   Pt's wife paged VAD coordinator this morning stating she called 911 as she found pt altered and not himself. EMS noted puddle of coffee ground/black emesis on ground near pt on their arrival. Pt does not recall vomiting.   Met pt in ER. Pt alert. Denies falling and hitting his head. He knows who this VAD coordinator is. He is able to follow simple commands, but is slow with his responses. When asked to touch his index finger to his nose he touched his lips. Right arm weakness noted. Right visual field deficit noted. Affect is flat. Smile is equal. Aphasia noted. Code stroke activated.   Complaining of continued chest pain. Pt was seen in ER earlier this week after reporting shock from ICD (despite device EOL). Found to have K 3.3 and Mag 1.5. Both were replaced and pt was discharged home. Started on K 20 meq daily and MagOxide 400 mg daily per Dr Mitzie Anda. Was seen by EP office 02/03/24- "remote transmission indicated that pt's HV therapies were still active.  However, because device is ERI the device stopped storing episodes November 2024." Was told in office device totally off now following episode earlier this week, and they were unable to interrogate.   STAT CT head 1. 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 Hyperdense Left PCA suggests occlusion of that vessel. No hemorrhagic transformation or mass effect. 2. Underlying advanced chronic small vessel disease. 3. Right supraorbital/forehead recent soft tissue injury with no skull  fracture identified.  STAT CT Angio Head Neck 1. CTP does not detect the known Left PCA infarct core (although T-max oligemia corresponds to that finding), while CTA demonstrates subtotal occlusion of the Left PCA at the P2/P3 junction (some distal flow). 2. CTP also suggests Oligemia in the anterior Right MCA division, although no MCA branch occlusion identified by CTA. 3. The above were discussed by telephone with Dr. Janett Medin on 02/05/2024 at 12:36 . 4. Additionally, Moderate to Severe bilateral ICA siphon atherosclerosis with at least Moderate siphon stenosis bilaterally (Moderate to Severe at the Right anterior genu). 5. Extracranial atherosclerosis but no significant Vertebral artery or extracranial carotid stenosis. 6. Aortic Atherosclerosis (ICD10-I70.0) and Emphysema (ICD10-J43.9).  Dr Julane Ny at bedside. Neurology consulted. Plan for admission to 2H.  Vital signs: HR: 50 Doppler MAP: not done Automated BP: 98/87 (83) O2 Sat: 100%  LVAD interrogation reveals:  Speed: 5300 Flow: 4.4 Power:  3.9 w PI: 3.3  Alarms: none Events: 5-10 daily  Drive Line: Existing VAD dressing removed and site care performed using sterile technique. Drive line exit site cleaned with Chlora prep applicators x 2, allowed to dry, and Sorbaview dressing with 2 x 2 (does not tolerate Silverlon) applied. Exit site healed and incorporated, the velour is fully implanted at exit site. Scant dried bloody drainage on previous dressing. No redness, tenderness, foul odor or rash noted. Drive line anchor re-applied over tape. Pt denies fever or chills.     Significant Events with LVAD:   Updated VAD Providers (Dr Julane Ny) about the above. No LVAD issues and  pump is functioning as expected. Able to independently manage LVAD equipment. No LVAD needs at this time.    Isaac Bosworth RN VAD Coordinator  Office: [phone]  24/7 Pager: [phone]

## 2024-02-05 NOTE — ED Triage Notes (Signed)
 Per [facility] pt coming from home- wife states patient was last seen 11pm last night and this morning saw some vomit on the ground and noticed patient was confused. Patient alert to self and location. States it is Saturday however unsure of the year or situation. 200 CC LR given by EMS.

## 2024-02-05 NOTE — Plan of Care (Signed)
  Problem: Education: Goal: Patient will understand all VAD equipment and how it functions Outcome: Progressing   Problem: Health Behavior/Discharge Planning: Goal: Ability to manage health-related needs will improve Outcome: Progressing   Problem: Clinical Measurements: Goal: Will remain free from infection Outcome: Progressing

## 2024-02-06 ENCOUNTER — Inpatient Hospital Stay (HOSPITAL_COMMUNITY)

## 2024-02-06 ENCOUNTER — Other Ambulatory Visit (HOSPITAL_COMMUNITY)

## 2024-02-06 DIAGNOSIS — I6389 Other cerebral infarction: Secondary | ICD-10-CM | POA: Diagnosis not present

## 2024-02-06 DIAGNOSIS — I63411 Cerebral infarction due to embolism of right middle cerebral artery: Secondary | ICD-10-CM | POA: Diagnosis not present

## 2024-02-06 DIAGNOSIS — I63442 Cerebral infarction due to embolism of left cerebellar artery: Secondary | ICD-10-CM | POA: Diagnosis not present

## 2024-02-06 DIAGNOSIS — I63432 Cerebral infarction due to embolism of left posterior cerebral artery: Secondary | ICD-10-CM | POA: Diagnosis not present

## 2024-02-06 DIAGNOSIS — Z7901 Long term (current) use of anticoagulants: Secondary | ICD-10-CM | POA: Diagnosis not present

## 2024-02-06 DIAGNOSIS — I639 Cerebral infarction, unspecified: Secondary | ICD-10-CM | POA: Diagnosis not present

## 2024-02-06 LAB — PROTIME-INR
INR: 2.6 — ABNORMAL HIGH (ref 0.8–1.2)
Prothrombin Time: 28.3 s — ABNORMAL HIGH (ref 11.4–15.2)

## 2024-02-06 LAB — CBC
HCT: 35.2 % — ABNORMAL LOW (ref 39.0–52.0)
Hemoglobin: 11.6 g/dL — ABNORMAL LOW (ref 13.0–17.0)
MCH: 29.7 pg (ref 26.0–34.0)
MCHC: 33 g/dL (ref 30.0–36.0)
MCV: 90.3 fL (ref 80.0–100.0)
Platelets: 145 10*3/uL — ABNORMAL LOW (ref 150–400)
RBC: 3.9 MIL/uL — ABNORMAL LOW (ref 4.22–5.81)
RDW: 15.9 % — ABNORMAL HIGH (ref 11.5–15.5)
WBC: 7.3 10*3/uL (ref 4.0–10.5)
nRBC: 0 % (ref 0.0–0.2)

## 2024-02-06 LAB — ECHOCARDIOGRAM COMPLETE
Height: 69 in
S' Lateral: 5 cm
Weight: 2931.24 [oz_av]

## 2024-02-06 LAB — LIPID PANEL
Cholesterol: 118 mg/dL (ref 0–200)
HDL: 39 mg/dL — ABNORMAL LOW (ref >40)
LDL Cholesterol: 73 mg/dL (ref 0–99)
Total CHOL/HDL Ratio: 3 ratio
Triglycerides: 32 mg/dL (ref <150)
VLDL: 6 mg/dL (ref 0–40)

## 2024-02-06 LAB — BASIC METABOLIC PANEL WITH GFR
Anion gap: 8 (ref 5–15)
BUN: 36 mg/dL — ABNORMAL HIGH (ref 8–23)
CO2: 20 mmol/L — ABNORMAL LOW (ref 22–32)
Calcium: 8.7 mg/dL — ABNORMAL LOW (ref 8.9–10.3)
Chloride: 108 mmol/L (ref 98–111)
Creatinine, Ser: 2.4 mg/dL — ABNORMAL HIGH (ref 0.61–1.24)
GFR, Estimated: 28 mL/min — ABNORMAL LOW (ref >60)
Glucose, Bld: 96 mg/dL (ref 70–99)
Potassium: 4.3 mmol/L (ref 3.5–5.1)
Sodium: 136 mmol/L (ref 135–145)

## 2024-02-06 LAB — GLUCOSE, CAPILLARY
Glucose-Capillary: 110 mg/dL — ABNORMAL HIGH (ref 70–99)
Glucose-Capillary: 76 mg/dL (ref 70–99)
Glucose-Capillary: 83 mg/dL (ref 70–99)
Glucose-Capillary: 89 mg/dL (ref 70–99)
Glucose-Capillary: 96 mg/dL (ref 70–99)

## 2024-02-06 LAB — HEMOGLOBIN A1C
Hgb A1c MFr Bld: 5.3 % (ref 4.8–5.6)
Mean Plasma Glucose: 105.41 mg/dL

## 2024-02-06 LAB — LACTATE DEHYDROGENASE: LDH: 161 U/L (ref 98–192)

## 2024-02-06 LAB — HEPARIN LEVEL (UNFRACTIONATED): Heparin Unfractionated: 0.32 [IU]/mL (ref 0.30–0.70)

## 2024-02-06 MED ORDER — WARFARIN SODIUM 2 MG PO TABS
2.0000 mg | ORAL_TABLET | Freq: Once | ORAL | Status: AC
  Administered 2024-02-06: 2 mg via ORAL
  Filled 2024-02-06: qty 1

## 2024-02-06 MED ORDER — PROCHLORPERAZINE EDISYLATE 10 MG/2ML IJ SOLN
10.0000 mg | Freq: Four times a day (QID) | INTRAMUSCULAR | Status: DC | PRN
  Administered 2024-02-07: 10 mg via INTRAVENOUS
  Filled 2024-02-06 (×2): qty 2

## 2024-02-06 MED ORDER — ORAL CARE MOUTH RINSE: 15.0000 mL | OROMUCOSAL | Status: DC | PRN

## 2024-02-06 MED ORDER — ORAL CARE MOUTH RINSE
15.0000 mL | OROMUCOSAL | Status: DC
  Administered 2024-02-06 (×4): 15 mL via OROMUCOSAL

## 2024-02-06 NOTE — Plan of Care (Signed)
 Cross Cover Note   Pt having persistent nausea this evening refractory to zofran . Unable to take evening PO meds. Qtc normal per my read on EKG.  Ordered compazine prn refractory n/v (2nd line).   Avonne Boettcher, MD Cardiology

## 2024-02-06 NOTE — Plan of Care (Signed)
  Problem: Education: Goal: Patient will understand all VAD equipment and how it functions Outcome: Progressing   Problem: Cardiac: Goal: LVAD will function as expected and patient will experience no clinical alarms Outcome: Progressing   Problem: Clinical Measurements: Goal: Ability to maintain clinical measurements within normal limits will improve Outcome: Progressing Goal: Will remain free from infection Outcome: Progressing Goal: Diagnostic test results will improve Outcome: Progressing Goal: Respiratory complications will improve Outcome: Progressing Goal: Cardiovascular complication will be avoided Outcome: Progressing   Problem: Activity: Goal: Risk for activity intolerance will decrease Outcome: Progressing   Problem: Coping: Goal: Level of anxiety will decrease Outcome: Progressing   Problem: Elimination: Goal: Will not experience complications related to bowel motility Outcome: Progressing Goal: Will not experience complications related to urinary retention Outcome: Progressing

## 2024-02-06 NOTE — Plan of Care (Signed)
  Problem: Education: Goal: Patient will understand all VAD equipment and how it functions Outcome: Progressing Goal: Patient will be able to verbalize current INR target range and antiplatelet therapy for discharge home Outcome: Progressing   Problem: Cardiac: Goal: LVAD will function as expected and patient will experience no clinical alarms Outcome: Progressing   Problem: Education: Goal: Knowledge of General Education information will improve Description: Including pain rating scale, medication(s)/side effects and non-pharmacologic comfort measures Outcome: Progressing   Problem: Health Behavior/Discharge Planning: Goal: Ability to manage health-related needs will improve Outcome: Progressing   Problem: Clinical Measurements: Goal: Ability to maintain clinical measurements within normal limits will improve Outcome: Progressing Goal: Will remain free from infection Outcome: Progressing Goal: Diagnostic test results will improve Outcome: Progressing Goal: Respiratory complications will improve Outcome: Progressing Goal: Cardiovascular complication will be avoided Outcome: Progressing   Problem: Activity: Goal: Risk for activity intolerance will decrease Outcome: Progressing   Problem: Coping: Goal: Level of anxiety will decrease Outcome: Progressing   Problem: Elimination: Goal: Will not experience complications related to bowel motility Outcome: Progressing Goal: Will not experience complications related to urinary retention Outcome: Progressing   Problem: Pain Managment: Goal: General experience of comfort will improve and/or be controlled Outcome: Progressing   Problem: Safety: Goal: Ability to remain free from injury will improve Outcome: Progressing   Problem: Skin Integrity: Goal: Risk for impaired skin integrity will decrease Outcome: Progressing   Problem: Education: Goal: Knowledge of disease or condition will improve Outcome: Progressing Goal:  Knowledge of secondary prevention will improve (MUST DOCUMENT ALL) Outcome: Progressing Goal: Knowledge of patient specific risk factors will improve (DELETE if not current risk factor) Outcome: Progressing   Problem: Ischemic Stroke/TIA Tissue Perfusion: Goal: Complications of ischemic stroke/TIA will be minimized Outcome: Progressing   Problem: Coping: Goal: Will verbalize positive feelings about self Outcome: Progressing Goal: Will identify appropriate support needs Outcome: Progressing   Problem: Health Behavior/Discharge Planning: Goal: Ability to manage health-related needs will improve Outcome: Progressing Goal: Goals will be collaboratively established with patient/family Outcome: Progressing   Problem: Self-Care: Goal: Ability to participate in self-care as condition permits will improve Outcome: Progressing Goal: Verbalization of feelings and concerns over difficulty with self-care will improve Outcome: Progressing Goal: Ability to communicate needs accurately will improve Outcome: Progressing   Problem: Nutrition: Goal: Risk of aspiration will decrease Outcome: Progressing   Problem: Nutrition: Goal: Adequate nutrition will be maintained Outcome: Not Progressing   Problem: Nutrition: Goal: Dietary intake will improve Outcome: Not Progressing

## 2024-02-06 NOTE — Progress Notes (Signed)
 PHARMACY - ANTICOAGULATION CONSULT NOTE  Pharmacy Consult for heparin   Indication: LVAD, new acute ischemic infarct   Allergies  Allergen Reactions   Mushroom Extract Complex (Obsolete) Nausea And Vomiting   Doxycycline  Nausea And Vomiting   Neosporin [Neomycin-Bacitracin Zn-Polymyx] Hives   Tape Other (See Comments)    Some tapes/dressings can irritate the skin    Patient Measurements: Height: 5\' 9"  (175.3 cm) Weight: 83.1 kg (183 lb 3.2 oz) IBW/kg (Calculated) : 70.7 HEPARIN  DW (KG): 83.5  Vital Signs: Temp: 98.4 F (36.9 C) (05/18 0400) Temp Source: Oral (05/18 0400) BP: 104/82 (05/18 0700) Pulse Rate: 67 (05/18 0700)  Labs: Recent Labs    02/04/24 0000 02/05/24 1106 02/05/24 1106 02/05/24 1200 02/05/24 1428 02/05/24 2119 02/06/24 0334  HGB  --  13.1   < > 13.9  --   --  11.6*  HCT  --  40.8  --  41.0  --   --  35.2*  PLT  --  171  --   --   --   --  145*  APTT  --  41*  --   --   --   --   --   LABPROT  --  24.6*  --   --   --   --  28.3*  INR 2.2 2.2*  --   --   --   --  2.6*  HEPARINUNFRC  --   --   --   --   --  0.20* 0.32  CREATININE  --  2.48*  --  2.60*  --   --  2.40*  TROPONINIHS  --  133*  --   --  185*  --   --    < > = values in this interval not displayed.    Estimated Creatinine Clearance: 28.2 mL/min (A) (by C-G formula based on SCr of 2.4 mg/dL (H)).   Medical History: Past Medical History:  Diagnosis Date   Arrhythmia    atrial fibrillation   CHF (congestive heart failure) (HCC)    Chronic kidney disease    Coronary artery disease    Hyperlipidemia    Hypertension    Myocardial infarct [facility])    Assessment: Patient admitted with new confusion, noted some vomit that resembled coffee grounds- HgB 13.9 and PLTS 171. Hx of LVAD on warfarin PTA, last dose 5/16 PM per wife. INR today 2.2 - regimen PTA is 4mg  warfarin on Monday and 2mg  all other days. CTA showing L PCA infarct and L cerebellar infarcts. Patient able to resume warfarin goal  INR 2.5-3.0 per conversation with neurology.  INR is therapeutic at 2.6. OK to discontinue UHF bridge. No s/sx of bleeding. CBC stable.    Goal of Therapy:  Heparin  level 0.3-0.5 units/ml Monitor platelets by anticoagulation protocol: Yes   Plan:  Give warfarin 2 mg x1 Stop heparin  infusion.  Monitor for s/sx of bleeding - CBC + INR.   Albino Alu, PharmD PGY2 Cardiology Pharmacy Resident 02/06/2024  9:36 AM

## 2024-02-06 NOTE — Progress Notes (Signed)
 Advanced Heart Failure Rounding Note   Subjective:    Remains aphasic weak on R side. Can follow simple commands  Denies SOB  INR 2.6  heparin  stopped by Neuro  CT head stable  V-pacing at 50 on monitor  Occasional NSVT  Objective:   Weight Range:  Vital Signs:   Temp:  [97.7 F (36.5 C)-98.8 F (37.1 C)] 97.7 F (36.5 C) (05/18 1201) Pulse Rate:  [30-159] 32 (05/18 1100) Resp:  [12-30] 13 (05/18 1100) BP: (81-111)/(57-95) 81/68 (05/18 1100) SpO2:  [72 %-100 %] 97 % (05/18 1100) Weight:  [83.1 kg] 83.1 kg (05/18 0500) Last BM Date :  (pta)  Weight change: Filed Weights   02/05/24 1056 02/06/24 0500  Weight: 83.5 kg 83.1 kg    Intake/Output:   Intake/Output Summary (Last 24 hours) at 02/06/2024 1204 Last data filed at 02/06/2024 1100 Gross per 24 hour  Intake 196.32 ml  Output 1325 ml  Net -1128.68 ml     Physical Exam: General:  sitting up in bed  No resp difficulty HEENT: normal mild R facial droop  Neck: supple. JVP . Carotids 2+ bilat; no bruits. No lymphadenopathy or thryomegaly appreciated. Cor: LVAD hum Lungs: clear Abdomen: soft, nontender, nondistended. No hepatosplenomegaly. No bruits or masses. Good bowel sounds. DL site ok Extremities: no cyanosis, clubbing, rash, edema Neuro: awake. Aphasic. Follows simple commands. Weak on R  Telemetry: V paced 50 occasional NSVT Personally reviewed  Labs: Basic Metabolic Panel: Recent Labs  Lab 02/02/24 2010 02/05/24 1106 02/05/24 1200 02/06/24 0334  NA 140 138 141 136  K 3.3* 4.2 4.3 4.3  CL 116* 108 108 108  CO2 20* 21*  --  20*  GLUCOSE 93 106* 101* 96  BUN 25* 42* 40* 36*  CREATININE 1.69* 2.48* 2.60* 2.40*  CALCIUM  7.2* 9.3  --  8.7*  MG 1.5* 2.0  --   --     Liver Function Tests: Recent Labs  Lab 02/05/24 1106  AST 26  ALT 21  ALKPHOS 92  BILITOT 1.5*  PROT 6.8  ALBUMIN  3.5   No results for input(s): "LIPASE", "AMYLASE" in the last 168 hours. No results for input(s):  "AMMONIA" in the last 168 hours.  CBC: Recent Labs  Lab 02/02/24 2010 02/05/24 1106 02/05/24 1200 02/06/24 0334  WBC 4.0 6.2  --  7.3  NEUTROABS  --  4.5  --   --   HGB 13.7 13.1 13.9 11.6*  HCT 42.1 40.8 41.0 35.2*  MCV 93.1 92.1  --  90.3  PLT 200 171  --  145*    Cardiac Enzymes: No results for input(s): "CKTOTAL", "CKMB", "CKMBINDEX", "TROPONINI" in the last 168 hours.  BNP: BNP (last 3 results) Recent Labs    02/05/24 1106  BNP 343.8*    ProBNP (last 3 results) No results for input(s): "PROBNP" in the last 8760 hours.    Other results:  Imaging: CT HEAD WO CONTRAST ( ) Result Date: 02/06/2024 CLINICAL DATA:  73 year old male with altered mental status, left PCA and cerebellar infarcts. EXAM: CT HEAD WITHOUT CONTRAST TECHNIQUE: Contiguous axial images were obtained from the base of the skull through the vertex without intravenous contrast. RADIATION DOSE REDUCTION: This exam was performed according to the departmental dose-optimization program which includes automated exposure control, adjustment of the mA and/or kV according to patient size and/or use of iterative reconstruction technique. COMPARISON:  CT head, CTP, CTA head and neck yesterday. FINDINGS: Brain: Mid left cerebellar hemisphere cytotoxic edema is  more apparent on series 3, image 7. But no associated hemorrhage. No posterior fossa mass effect. Other posterior fossa gray-white differentiation is stable. Left PCA territory confluent cytotoxic edema appears somewhat more stable. Also perhaps mildly more conspicuous on coronal images now. No hemorrhage or mass effect. Patchy cytotoxic edema also now visible in the right middle frontal gyrus, and this corresponds to CTP T-max abnormality yesterday. See series 3, image 23. No hemorrhage or mass effect. Chronic left caudate lacunar infarct. Age indeterminate heterogeneity in the left thalamus appears stable. And stable gray-white differentiation elsewhere. Normal  basilar cisterns. No ventriculomegaly or intracranial mass effect. Vascular: Calcified atherosclerosis at the skull base. No suspicious intracranial vascular hyperdensity. Skull: Stable and intact.  No acute osseous abnormality identified. Sinuses/Orbits: Visualized paranasal sinuses and mastoids are stable and well aerated. Other: Mild right supraorbital forehead soft tissue injury appears regressed. Negative orbits soft tissues. IMPRESSION: 1. Expected evolution of Left cerebellar and Left PCA infarcts since yesterday. No hemorrhage or mass effect. 2. Evolution also of a Right MCA middle frontal gyrus infarct predicted by abnormal CTP T-max yesterday. No hemorrhage or mass effect. 3. Consider a recent Embolic event given the recent anterior and posterior vascular territory infarcts. Electronically Signed   By: Marlise Simpers M.D.   On: 02/06/2024 09:17   CT ANGIO HEAD NECK W WO CM W PERF (CODE STROKE) Result Date: 02/05/2024 CLINICAL DATA:  73 year old male with altered mental status and recent appearing left PCA and cerebellar infarcts on plain head CT this morning. EXAM: CT ANGIOGRAPHY HEAD AND NECK CT PERFUSION BRAIN TECHNIQUE: Multidetector CT imaging of the head and neck was performed using the standard protocol during bolus administration of intravenous contrast. Multiplanar CT image reconstructions and MIPs were obtained to evaluate the vascular anatomy. Carotid stenosis measurements (when applicable) are obtained utilizing NASCET criteria, using the distal internal carotid diameter as the denominator. Multiphase CT imaging of the brain was performed following IV bolus contrast injection. Subsequent parametric perfusion maps were calculated using RAPID software. RADIATION DOSE REDUCTION: This exam was performed according to the departmental dose-optimization program which includes automated exposure control, adjustment of the mA and/or kV according to patient size and/or use of iterative reconstruction  technique. CONTRAST:  OMNIPAQUE  IOHEXOL  350 MG/ML SOLN COMPARISON:  Plain head CT 1121 hours. FINDINGS: CT Brain Perfusion Findings: ASPECTS: Not applicable, posterior circulation involvement. CBF (<30%) Volume: 0mL. No CBF parameter abnormality. Minimal CBV abnormality at the left occipital pole. Perfusion (Tmax>6.0s) volume: 41mL, corresponding not only to the broad-based left PCA area of involvement but also anterior right MCA division. Mismatch Volume: Questionably only in the anterior right MCA division, see CTA findings below. Infarction Location:Largest left PCA. CTA NECK Skeleton: Previous sternotomy. Absent mandible dentition. Mild for age cervical spine degeneration. No acute osseous abnormality identified. Upper chest: Left chest cardiac pacemaker. Sequelae of CABG. Some evidence of upper lung centrilobular emphysema. Visible superior mediastinal lymph nodes within normal limits. Other neck: Nonvascular neck soft tissue spaces are within normal limits. Aortic arch: Calcified aortic atherosclerosis.  3 vessel arch. Right carotid system: Patent with widespread but mild soft plaque from the brachiocephalic artery through the ICA. Mild additional calcified plaque at the right ICA origin or bulb. No hemodynamically significant stenosis. Left carotid system: Similar widespread but generally mild soft plaque. Mild to moderate soft and calcified plaque at the distal bulb. No significant stenosis to the skull base. Vertebral arteries: Proximal right subclavian artery is patent with mild atherosclerosis. Calcified plaque at the right  vertebral artery origin without stenosis. Paravertebral venous contrast contamination but the right vertebral artery appears normal to the skull base. Proximal left subclavian soft and calcified plaque without stenosis. Plaque at the left vertebral artery origin but no convincing stenosis. Paravertebral venous contrast contamination is greater on this side but the visible left  vertebral artery is patent and normal to the skull base. Codominant CTA HEAD Posterior circulation: Codominant distal vertebral arteries and vertebrobasilar junction are patent without plaque or stenosis. Mildly fenestrated vertebrobasilar junction, fenestrated vertebrobasilar junction is a normal variant. Bilateral PICA appear diminutive, AICA probably dominant. But the left PICA is patent. Patent basilar artery with mild irregularity. Patent SCA and PCA origins. Right PCA branches are within normal limits. Posterior communicating arteries are diminutive or absent. Left PCA is patent until the P2/P3 junction, where there is near occlusion but there is some faint distal PCA branch enhancement. See series 12, image 25. Anterior circulation: Both ICA siphons are patent. Left siphon calcified cavernous segment atherosclerosis with moderate stenosis series 8, image 87. Contralateral right siphon similar distal cavernous and anterior genu calcified plaque with moderate to severe stenosis on series 8, image 81. Additional mild to moderate right supraclinoid stenosis. Patent carotid termini, MCA and ACA origins. Anterior communicating artery, bilateral ACA branches are within normal limits. Left MCA M1 segment and trifurcation are patent without stenosis. Right MCA M1 segment and trifurcation are patent without stenosis. Bilateral MCA branches are within normal limits. No anterior right MCA division branch occlusion is identified. Venous sinuses: Early contrast timing, grossly patent. Anatomic variants: Fenestrated vertebrobasilar junction. Review of the MIP images confirms the above findings IMPRESSION: 1. CTP does not detect the known Left PCA infarct core (although T-max oligemia corresponds to that finding), while CTA demonstrates subtotal occlusion of the Left PCA at the P2/P3 junction (some distal flow). 2. CTP also suggests Oligemia in the anterior Right MCA division, although no MCA branch occlusion identified by  CTA. 3. The above were discussed by telephone with Dr. Janett Medin on 02/05/2024 at 12:36 . 4. Additionally, Moderate to Severe bilateral ICA siphon atherosclerosis with at least Moderate siphon stenosis bilaterally (Moderate to Severe at the Right anterior genu). 5. Extracranial atherosclerosis but no significant Vertebral artery or extracranial carotid stenosis. 6. Aortic Atherosclerosis (ICD10-I70.0) and Emphysema (ICD10-J43.9). Electronically Signed   By: Marlise Simpers M.D.   On: 02/05/2024 12:38   DG Chest Portable 1 View Result Date: 02/05/2024 CLINICAL DATA:  Altered level of consciousness, emesis EXAM: PORTABLE CHEST 1 VIEW COMPARISON:  02/02/2024 FINDINGS: Single frontal view of the chest demonstrates stable left ventricular assist device and multi lead pacer/AICD. Cardiac silhouette remains enlarged. No acute airspace disease, effusion, or pneumothorax. No acute bony abnormalities. IMPRESSION: 1. Stable support devices. 2. No acute intrathoracic process. Electronically Signed   By: Bobbye Burrow M.D.   On: 02/05/2024 11:57   CT HEAD WO CONTRAST ( ) Addendum Date: 02/05/2024 ADDENDUM REPORT: 02/05/2024 11:54 ADDENDUM: Study discussed by telephone with Dr. Paris Bolds on 02/05/2024 1150 hours. Electronically Signed   By: Marlise Simpers M.D.   On: 02/05/2024 11:54   Result Date: 02/05/2024 CLINICAL DATA:  73 year old male with altered mental status. Vomiting and confusion. EXAM: CT HEAD WITHOUT CONTRAST TECHNIQUE: Contiguous axial images were obtained from the base of the skull through the vertex without intravenous contrast. RADIATION DOSE REDUCTION: This exam was performed according to the departmental dose-optimization program which includes automated exposure control, adjustment of the mA and/or kV according to patient size and/or use  of iterative reconstruction technique. COMPARISON:  None Available. FINDINGS: Brain: No midline shift, mass effect, or evidence of intracranial mass lesion. No  ventriculomegaly. No acute intracranial hemorrhage identified. Small but circumscribed, chronic appearing lacunar infarcts in the left caudate, right lentiform, left thalamus. Patchy additional moderate bilateral cerebral white matter hypodensity, asymmetric. Indistinct linear, wedge-shaped hypodensity in the left cerebellar hemisphere series 2, image 8 and series 4, image 15. other posterior fossa gray-white differentiation is within normal limits. No posterior fossa mass effect. Furthermore, there is similar indistinct cytotoxic edema in the left PCA territory, much of the medial left occipital lobe is affected. No associated hemorrhage or mass effect. Vascular: Questionable asymmetric hyperdensity of the distal left vertebral artery. Asymmetric hyperdense left PCA. Skull: Intact.  No acute osseous abnormality identified. Sinuses/Orbits: Mild to moderate bilateral paranasal sinus mucosal thickening. No layering sinus fluid. Tympanic cavities and mastoids well aerated. Other: Right supraorbital, forehead soft tissue swelling and trace soft tissue gas suggesting injury on series 3, image 18. Underlying right frontal bone intact, hypoplastic right frontal sinus. Other visible scalp and orbits soft tissues are within normal limits. IMPRESSION: 1. 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 Hyperdense Left PCA suggests occlusion of that vessel. No hemorrhagic transformation or mass effect. 2. Underlying advanced chronic small vessel disease. 3. Right supraorbital/forehead recent soft tissue injury with no skull fracture identified. Electronically Signed: By: Marlise Simpers M.D. On: 02/05/2024 11:41     Medications:     Scheduled Medications:  Chlorhexidine  Gluconate Cloth  6 each Topical Daily   feeding supplement  237 mL Oral BID BM   furosemide   40 mg Oral Daily   magnesium  oxide  400 mg Oral Daily   mouth rinse  15 mL Mouth Rinse 4 times per day   pantoprazole   40 mg Oral  Daily   potassium chloride   20 mEq Oral Daily   rosuvastatin   10 mg Oral QHS   sildenafil   20 mg Oral TID   warfarin  2 mg Oral ONCE-1600   Warfarin - Pharmacist Dosing Inpatient   Does not apply q1600    Infusions:   PRN Medications: acetaminophen , ondansetron  (ZOFRAN ) IV, ondansetron , mouth rinse, mouth rinse   Assessment/Plan:    1. 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 Hyperdense Left PCA suggests occlusion of that vessel. No hemorrhagic transformation or mass effect.  - f/u CT brain 5/18 evolution of L PCA, L cerebellar and R MCA strokes. No hemorrhage - Seen by Neuro. Heparin  stopped Goal INR increased 2.5-3.0. INR 2.6 today Discussed warfarin dosing with PharmD personally. - Consult PT/OT - Can likely go to 2C soon - Echo pending   2. Chronic systolic CHF:  Long-standing cardiomyopathy.  [employer] ICD. S/p HM-3 VAD 09/17/21 Echo in 7/24 showed EF < 20%, IV septum midline to slightly leftward, RV moderately dilated with moderately decreased systolic function, IVC dilated with severe TR.  - NYHA I at baseline - On sildenafil  for RV dysfunction - volume ok - VAD interrogated personally. Parameters stable. - DL ok  - ICD at EOL. Possible recent ICD shock but unclear. ICD now pacing at 50 (seems dependent). Will d/w industry in am. At last check unable to communicate with device   3. Tricuspid regurgitation:  Tricuspid repair not done at time of VAD due to proximity of ICD wires and hypotension during surgery.  He has severe TR.  4. Chronic Atrial fibrillation: - Atrial fibrillation now chronic, he is off amiodarone  with resolution of nausea. - Rate controlled. On warfarin - No change   5. CKD stage IV:  - baseline Scr ~2.5.  - Avoid contrast studies as much as possible (d/w Neuro) - Scr stable 2.4 today - Consider SGLT2i - Follows with nephrology.    6. CAD: History of PCI  to OM1 in 2007 and RCA in 2013.   - No s/s angina - continue statin   7. GERD with coffee ground emesis - hgb stable - continue PPI       Length of Stay: 1   Jules Oar MD 02/06/2024, 12:04 PM  Advanced Heart Failure Team Pager [phone] (M-F; 7a - 4p)  Please contact [facility] for night-coverage after hours (4p -7a ) and weekends on [email or web address]

## 2024-02-06 NOTE — Progress Notes (Signed)
  Echocardiogram 2D Echocardiogram has been performed.  Ridgely Anastacio 02/06/2024, 10:41 AM

## 2024-02-06 NOTE — Progress Notes (Signed)
 STROKE TEAM PROGRESS NOTE    Subjective : Patient with LVAD on long-term warfarin with INR of 2.2 yesterday presented with sudden onset of confusion and altered mental status outside time window for thrombolysis and found to have left thalamic and cerebellar infarcts as well as right frontal inferior branch infarct on CT angio and perfusion with not a candidate for intervention due to very distal left P2/P3 occlusion.   Patient remains aphasic with right sided peripheral vision loss this morning had some neurological worsening hence underwent stat CT scan of the head which shows expected evolution in the left cerebellar and left PCA infarcts as well as right MCA frontal gyrus infarct.  No hemorrhage.  Remains on IV heparin .  INR is more optimal today at 2.6.  Echocardiogram is pending.     02/06/2024   11:00 AM 02/06/2024   10:00 AM 02/06/2024    9:00 AM  Vitals with BMI  Systolic 81 92 83  Diastolic 68 68 74  Pulse 32 82 105    Objective : Patient is awake alert.  Remains aphasic and follows simple midline and from one-step commands.  Occasional word identified his wife does not speak in sentences.  He has some left gaze preference but can look to the right all the way.  He has dense right homonymous hemianopsia.  Subtle right lower facial weakness.  Tongue midline.  No upper or lower extremity drift but diminished fine finger movements on the right not deflective right upper extremity.  Trace weakness of right lower extremity with no drift.  Mentation likely diminished on the right.  Gait not tested.  Repeat Ct head 02/06/24 evolving left cerebellar, left PCA and right frontal gyrus MCA branch infarct.  No hemorrhage. MRI cannot be done when patient has LVAD device CT angio distal left P2/P3 occlusion Echo pending INR 2.6 Lipid profile and hemoglobin A1c pending  IMPRESSION : 73 year old African-American male with cardioembolic left cerebellar, PCA and right frontal MCA branch infarcts  secondary to LVAD device despite being on anticoagulation with warfarin.  PLAN : DC IV heparin  as INR is optimal and about target goal of 2.5-3.  Continue warfarin.  Mobilize out of bed.  Therapy consults.  Check echocardiogram, lipid profile and hemoglobin A1c.  Physical occupational and speech therapy consults.  Long discussion with patient and family at the bedside and answered questions. This patient is critically ill and at significant risk of neurological worsening, death and care requires constant monitoring of vital signs, hemodynamics,respiratory and cardiac monitoring, extensive review of multiple databases, frequent neurological assessment, discussion with family, other specialists and medical decision making of high complexity.I have made any additions or clarifications directly to the above note.This critical care time does not reflect procedure time, or teaching time or supervisory time of PA/NP/Med Resident etc but could involve care discussion time.  I spent 30 minutes of neurocritical care time  in the care of  this patient.  Ardella Beaver, MD

## 2024-02-07 DIAGNOSIS — Z95811 Presence of heart assist device: Secondary | ICD-10-CM | POA: Diagnosis not present

## 2024-02-07 DIAGNOSIS — I63442 Cerebral infarction due to embolism of left cerebellar artery: Secondary | ICD-10-CM | POA: Diagnosis not present

## 2024-02-07 DIAGNOSIS — I5022 Chronic systolic (congestive) heart failure: Secondary | ICD-10-CM

## 2024-02-07 DIAGNOSIS — I63411 Cerebral infarction due to embolism of right middle cerebral artery: Secondary | ICD-10-CM | POA: Diagnosis not present

## 2024-02-07 DIAGNOSIS — Z7901 Long term (current) use of anticoagulants: Secondary | ICD-10-CM | POA: Diagnosis not present

## 2024-02-07 DIAGNOSIS — I63432 Cerebral infarction due to embolism of left posterior cerebral artery: Secondary | ICD-10-CM | POA: Diagnosis not present

## 2024-02-07 DIAGNOSIS — I639 Cerebral infarction, unspecified: Secondary | ICD-10-CM | POA: Diagnosis not present

## 2024-02-07 LAB — CBC
HCT: 42.5 % (ref 39.0–52.0)
Hemoglobin: 13.5 g/dL (ref 13.0–17.0)
MCH: 29.6 pg (ref 26.0–34.0)
MCHC: 31.8 g/dL (ref 30.0–36.0)
MCV: 93.2 fL (ref 80.0–100.0)
Platelets: 164 10*3/uL (ref 150–400)
RBC: 4.56 MIL/uL (ref 4.22–5.81)
RDW: 15.9 % — ABNORMAL HIGH (ref 11.5–15.5)
WBC: 5.3 10*3/uL (ref 4.0–10.5)
nRBC: 0 % (ref 0.0–0.2)

## 2024-02-07 LAB — LACTATE DEHYDROGENASE: LDH: 175 U/L (ref 98–192)

## 2024-02-07 LAB — GLUCOSE, CAPILLARY: Glucose-Capillary: 77 mg/dL (ref 70–99)

## 2024-02-07 LAB — BASIC METABOLIC PANEL WITH GFR
Anion gap: 14 (ref 5–15)
BUN: 33 mg/dL — ABNORMAL HIGH (ref 8–23)
CO2: 20 mmol/L — ABNORMAL LOW (ref 22–32)
Calcium: 9.1 mg/dL (ref 8.9–10.3)
Chloride: 107 mmol/L (ref 98–111)
Creatinine, Ser: 2.33 mg/dL — ABNORMAL HIGH (ref 0.61–1.24)
GFR, Estimated: 29 mL/min — ABNORMAL LOW (ref >60)
Glucose, Bld: 81 mg/dL (ref 70–99)
Potassium: 4.2 mmol/L (ref 3.5–5.1)
Sodium: 141 mmol/L (ref 135–145)

## 2024-02-07 LAB — PROTIME-INR
INR: 2.3 — ABNORMAL HIGH (ref 0.8–1.2)
Prothrombin Time: 25.4 s — ABNORMAL HIGH (ref 11.4–15.2)

## 2024-02-07 MED ORDER — FUROSEMIDE 40 MG PO TABS
40.0000 mg | ORAL_TABLET | ORAL | Status: DC
  Administered 2024-02-08 – 2024-02-10 (×2): 40 mg via ORAL
  Filled 2024-02-07 (×2): qty 1

## 2024-02-07 MED ORDER — WARFARIN SODIUM 2 MG PO TABS
4.0000 mg | ORAL_TABLET | Freq: Once | ORAL | Status: AC
  Administered 2024-02-07: 4 mg via ORAL
  Filled 2024-02-07: qty 2

## 2024-02-07 MED ORDER — POTASSIUM CHLORIDE CRYS ER 20 MEQ PO TBCR
20.0000 meq | EXTENDED_RELEASE_TABLET | Freq: Every day | ORAL | Status: DC
  Administered 2024-02-07 – 2024-02-08 (×2): 20 meq via ORAL
  Filled 2024-02-07 (×3): qty 1

## 2024-02-07 NOTE — Progress Notes (Signed)

## 2024-02-07 NOTE — Progress Notes (Signed)
 PHARMACY - ANTICOAGULATION CONSULT NOTE  Pharmacy Consult for warfarin  Indication: LVAD, new acute ischemic infarct   Allergies  Allergen Reactions   Mushroom Extract Complex (Obsolete) Nausea And Vomiting   Doxycycline  Nausea And Vomiting   Neosporin [Neomycin-Bacitracin Zn-Polymyx] Hives   Tape Other (See Comments)    Some tapes/dressings can irritate the skin    Patient Measurements: Height: 5\' 9"  (175.3 cm) Weight: 83 kg (182 lb 15.7 oz) IBW/kg (Calculated) : 70.7 HEPARIN  DW (KG): 83.5  Vital Signs: Temp: 98 F (36.7 C) (05/19 0400) Temp Source: Oral (05/18 2345) BP: 96/74 (05/19 0900) Pulse Rate: 72 (05/19 0900)  Labs: Recent Labs    02/05/24 1106 02/05/24 1200 02/05/24 1428 02/05/24 2119 02/06/24 0334 02/07/24 0344  HGB 13.1 13.9  --   --  11.6* 13.5  HCT 40.8 41.0  --   --  35.2* 42.5  PLT 171  --   --   --  145* 164  APTT 41*  --   --   --   --   --   LABPROT 24.6*  --   --   --  28.3* 25.4*  INR 2.2*  --   --   --  2.6* 2.3*  HEPARINUNFRC  --   --   --  0.20* 0.32  --   CREATININE 2.48* 2.60*  --   --  2.40* 2.33*  TROPONINIHS 133*  --  185*  --   --   --     Estimated Creatinine Clearance: 29.1 mL/min (A) (by C-G formula based on SCr of 2.33 mg/dL (H)).   Medical History: Past Medical History:  Diagnosis Date   Arrhythmia    atrial fibrillation   CHF (congestive heart failure) (HCC)    Chronic kidney disease    Coronary artery disease    Hyperlipidemia    Hypertension    Myocardial infarct [facility])    Assessment: 63 yoM with HM LVAD on warfarin PTA admitted with acute CVA. Home warfarin regimen is 4mg  Mon/2mg  AODs with INR goal 2-2.5. Per HF and neurology, increase INR goal to 2.5-3 for now. Pt previously on heparin  briefly this admit.  INR 2.3 today, LDH and CBC ok. Will hold off on resuming heparin  per now.    Goal of Therapy:  INR 2.5-3 Monitor platelets by anticoagulation protocol: Yes   Plan:  Warfarin 4mg  x1 Daily INR  Levin Reamer, PharmD, BCPS, [facility] Clinical Pharmacist [phone] Please check AMION for all [facility] Pharmacy numbers 02/07/2024

## 2024-02-07 NOTE — Progress Notes (Signed)
 Advanced Heart Failure Rounding Note   Subjective:    Talking more this morning, weak on the right side but improving. Passed swallow study.   INR 2.3, goal 2.5-3, discussed with pharmacy.  V-pacing at 50 on monitor. Discussed case with EP, given his previous shock and pacing needs would recommend generator change, can be done prior to discharge.   Objective:   Weight Range:  Vital Signs:   Temp:  [97.8 F (36.6 C)-98.4 F (36.9 C)] 98 F (36.7 C) (05/19 0400) Pulse Rate:  [30-169] 53 (05/19 1100) Resp:  [11-24] 16 (05/19 1100) BP: (87-118)/(61-86) 118/75 (05/19 1100) SpO2:  [91 %-100 %] 96 % (05/19 1100) Weight:  [83 kg] 83 kg (05/19 0500) Last BM Date :  (PTA)  Weight change: Filed Weights   02/05/24 1056 02/06/24 0500 02/07/24 0500  Weight: 83.5 kg 83.1 kg 83 kg    Intake/Output:   Intake/Output Summary (Last 24 hours) at 02/07/2024 1215 Last data filed at 02/07/2024 0800 Gross per 24 hour  Intake --  Output 1725 ml  Net -1725 ml     Physical Exam: General:  sitting up in bed, NAD HEENT: mild R facial droop  Cor: LVAD hum, JVP flat Lungs: clear Abdomen: soft, nontender, nondistended. Extremities: no cyanosis, clubbing, rash, edema Neuro: awake. Speaks in short sentences. Follows simple commands. Weak on R  Telemetry: V paced 50   Medications:     Scheduled Medications:  Chlorhexidine  Gluconate Cloth  6 each Topical Daily   feeding supplement  237 mL Oral BID BM   [START ON 02/08/2024] furosemide   40 mg Oral QODAY   magnesium  oxide  400 mg Oral Daily   pantoprazole   40 mg Oral Daily   potassium chloride   20 mEq Oral Daily   rosuvastatin   10 mg Oral QHS   sildenafil   20 mg Oral TID   warfarin  4 mg Oral ONCE-1600   Warfarin - Pharmacist Dosing Inpatient   Does not apply q1600    Infusions:   PRN Medications: acetaminophen , ondansetron  (ZOFRAN ) IV, ondansetron , mouth rinse, mouth rinse, prochlorperazine    Assessment/Plan:    1. 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 Hyperdense Left PCA suggests occlusion of that vessel. No hemorrhagic transformation or mass effect.  - f/u CT brain 5/18 evolution of L PCA, L cerebellar and R MCA strokes. No hemorrhage - Seen by Neuro. Goal INR increased 2.5-3.0. INR 2.3 today, would not restart heparin , discussed warfarin dosing with PharmD personally - Appreciate PT/OT - Downgrade to 2C - Echo without clear source of embolization   2. Chronic systolic CHF:  Long-standing cardiomyopathy.  [employer] ICD. S/p HM-3 VAD 09/17/21.  - NYHA I at baseline - On sildenafil  for RV dysfunction - volume ok, decrease lasix  to every other day given poor PO intake - VAD interrogated personally. Parameters stable. - DL ok  - ICD at EOL. Possible recent ICD shock but unclear. ICD now pacing at 50 (seems dependent). Discussed with device rep and EP   3. Tricuspid regurgitation:  Tricuspid repair not done at time of VAD due to proximity of ICD wires and hypotension during surgery.  He has severe TR.    4. Permanent Atrial fibrillation: - Atrial fibrillation now chronic, he is off amiodarone  with resolution of nausea. - Unclear underlying rhythm, likely afib   5. CKD stage IV:  - baseline Scr ~2.5.  - Avoid  contrast studies as much as possible (d/w Neuro) - Scr stable 2.3 today - Consider SGLT2i prior to discharge and transition to lasix  prn - Follows with nephrology.    6. CAD: History of PCI to OM1 in 2007 and RCA in 2013.   - No s/s angina - continue statin   7. GERD with coffee ground emesis - hgb stable - continue PPI     LVAD Interrogation HM 3: Speed: 5300 Flow: 4.7 PI: 2.9 Power: 4.   I reviewed the LVAD parameters from today, and compared the results to the patient's prior recorded data.  No programming changes were made.  The LVAD is functioning within specified parameters.  The patient  performs LVAD self-test daily.  LVAD interrogation was negative for any significant power changes, alarms or PI events/speed drops.  LVAD equipment check completed and is in good working order.      Length of Stay: 2   Lauralee Poll MD 02/07/2024, 12:15 PM  Advanced Heart Failure Team Pager [phone] (M-F; 7a - 4p)  Please contact [facility] for night-coverage after hours (4p -7a ) and weekends on [email or web address]

## 2024-02-07 NOTE — Evaluation (Signed)
 Speech Language Pathology Evaluation Patient Details Name: Natan Hartog MRN: [id number] DOB: 08-Jun-1951 Today's Date: 02/07/2024 Time: 1010-1040 SLP Time Calculation (min) (ACUTE ONLY): 30 min  Problem List:  Patient Active Problem List   Diagnosis Date Noted   Acute stroke due to ischemia (HCC) 02/05/2024   Abnormal thyroid  blood test 04/05/2023   Abscess of left groin 11/27/2022   VT (ventricular tachycardia) (HCC) 10/27/2022   ICD (implantable cardioverter-defibrillator) in place 10/27/2022   Pressure injury of skin 10/07/2021   LVAD (left ventricular assist device) present (HCC)    Protein-calorie malnutrition, severe 09/10/2021   Elevated TSH    PICC (peripherally inserted central catheter) in place    Cardiogenic shock (HCC)    Syncope 09/07/2021   CHF (congestive heart failure) (HCC) 09/06/2021   CAD (coronary artery disease) 08/22/2021   Cardiomyopathy (HCC) 08/22/2021   Mixed hyperlipidemia 08/22/2021   A-fib (HCC) 08/22/2021   Elevated troponin 08/22/2021   Acute on chronic combined systolic and diastolic CHF (congestive heart failure) (HCC) 08/22/2021   Benign essential hypertension 02/22/2015   Chronic systolic heart failure (HCC) 07/05/2014   Past Medical History:  Past Medical History:  Diagnosis Date   Arrhythmia    atrial fibrillation   CHF (congestive heart failure) (HCC)    Chronic kidney disease    Coronary artery disease    Hyperlipidemia    Hypertension    Myocardial infarct [facility])    Past Surgical History:  Past Surgical History:  Procedure Laterality Date   CARDIAC DEFIBRILLATOR PLACEMENT  feb 2014   INSERTION OF IMPLANTABLE LEFT VENTRICULAR ASSIST DEVICE N/A 09/17/2021   Procedure: INSERTION OF IMPLANTABLE LEFT VENTRICULAR ASSIST DEVICE AND INSERTION OF FEMORAL ARTERIAL LINE;  Surgeon: Bartley Lightning, MD;  Location: [facility] OR;  Service: Open Heart Surgery;  Laterality: N/A;   IR FLUORO GUIDE CV LINE RIGHT  09/27/2021   IR THORACENTESIS ASP  PLEURAL SPACE W/IMG GUIDE  09/27/2021   IR US  GUIDE VASC ACCESS RIGHT  09/27/2021   PLACEMENT OF IMPELLA LEFT VENTRICULAR ASSIST DEVICE N/A 09/12/2021   Procedure: PLACEMENT OF IMPELLA 5.5 LEFT VENTRICULAR ASSIST DEVICE;  Surgeon: Zelphia Higashi, MD;  Location: [facility] OR;  Service: Open Heart Surgery;  Laterality: N/A;   RIGHT HEART CATH N/A 09/08/2021   Procedure: RIGHT HEART CATH;  Surgeon: Darlis Eisenmenger, MD;  Location: [facility] INVASIVE CV LAB;  Service: Cardiovascular;  Laterality: N/A;   TEE WITHOUT CARDIOVERSION N/A 09/12/2021   Procedure: TRANSESOPHAGEAL ECHOCARDIOGRAM (TEE);  Surgeon: Zelphia Higashi, MD;  Location: [facility] OR;  Service: Open Heart Surgery;  Laterality: N/A;   TEE WITHOUT CARDIOVERSION N/A 09/17/2021   Procedure: TRANSESOPHAGEAL ECHOCARDIOGRAM (TEE);  Surgeon: Bartley Lightning, MD;  Location: [facility] OR;  Service: Open Heart Surgery;  Laterality: N/A;   HPI:  73 year old African-American male with cardioembolic left cerebellar, 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 Repeat Ct head 02/06/24 evolving left cerebellar, left PCA and right frontal gyrus MCA branch infarct.  No hemorrhage.  MRI cannot be done when patient has LVAD device. Pt had esophagram on 04/27/23 due to complaint of chest tightness while eating. No penetration or aspiration, small volume reflux.   Assessment / Plan / Recommendation Clinical Impression  Pt demonstrates moderate cognitive lingusitic impairment. He struggles to sustain attention and needs distraction eliminated and frequent cues to  keep on task or resume task after an error or struggle. Pt has right homonymous hemianopsia but is  now attending to the right with cues to direct attention. However, he struggles to recognize images in pictures or discern colors in either visual field. He cannot read words or phrases. Pt is able to name familiar objects and body part with only one phonemic  error. He repeats without error. Primary area of impairment is language comprehension. His accuracy with complex Y/N and 2 step commands is very poor. When asked biographical questions he repeats the question, but in order to answer often needs a carrier phrase or initial sound cue. Pt will benefit from intensive SLP intervention to improve language and return to more independent function at home. Will f/u acutely; started some initial education with wife. Pt would benefit from Aphasia Booklet.    SLP Assessment  SLP Recommendation/Assessment: Patient needs continued Speech Lanaguage Pathology Services SLP Visit Diagnosis: Aphasia (R47.01)    Recommendations for follow up therapy are one component of a multi-disciplinary discharge planning process, led by the attending physician.  Recommendations may be updated based on patient status, additional functional criteria and insurance authorization.    Follow Up Recommendations  Acute inpatient rehab (3hours/day)    Assistance Recommended at Discharge  Intermittent Supervision/Assistance  Functional Status Assessment Patient has had a recent decline in their functional status and demonstrates the ability to make significant improvements in function in a reasonable and predictable amount of time.  Frequency and Duration min 2x/week  2 weeks      SLP Evaluation Cognition  Overall Cognitive Status: Impaired/Different from baseline Arousal/Alertness: Lethargic Orientation Level: Oriented to person;Oriented to place;Disoriented to time;Oriented to situation Attention: Focused;Sustained;Selective Focused Attention: Appears intact Sustained Attention: Impaired Sustained Attention Impairment: Verbal basic;Functional basic Selective Attention: Impaired Selective Attention Impairment: Verbal basic;Functional basic Memory:  (NT) Awareness: Impaired Awareness Impairment: Intellectual impairment;Emergent impairment;Anticipatory impairment Problem  Solving: Impaired Problem Solving Impairment: Functional basic;Verbal basic       Comprehension  Auditory Comprehension Overall Auditory Comprehension: Impaired Yes/No Questions: Impaired Basic Biographical Questions: 51-75% accurate Basic Immediate Environment Questions: 25-49% accurate Complex Questions: 0-24% accurate Commands: Impaired One Step Basic Commands: 25-49% accurate Two Step Basic Commands: 0-24% accurate Multistep Basic Commands: 0-24% accurate Conversation: Simple Interfering Components: Visual impairments;Motor planning EffectiveTechniques: Extra processing time;Repetition;Visual/Gestural cues Visual Recognition/Discrimination Discrimination: Exceptions to [facility] Common Objects: Able in field of 3 Reading Comprehension Reading Status: Impaired Word level: Impaired Sentence Level: Impaired    Expression Verbal Expression Overall Verbal Expression: Impaired Initiation: Impaired Automatic Speech: Name;Social Response Level of Generative/Spontaneous Verbalization: Phrase;Word Repetition: No impairment Naming: Impairment Responsive: Not tested Confrontation: Impaired (90 % accurate) Common Objects: Able in field of 3 Convergent: Not tested Verbal Errors: Phonemic paraphasias Interfering Components: Attention Effective Techniques: Phonemic cues   Oral / Motor  Oral Motor/Sensory Function Overall Oral Motor/Sensory Function: Within functional limits Motor Speech Overall Motor Speech: Appears within functional limits for tasks assessed            Shervon Kerwin, Hardin Leys 02/07/2024, 11:25 AM

## 2024-02-07 NOTE — Evaluation (Signed)
 Occupational Therapy Evaluation Patient Details Name: Isaac [name] MRN: [id number] DOB: [birth year] Today's Date: 02/07/2024   History of Present Illness   Pt is a 73 y/o M presenting to ED with concernsf or his defibrillator activating, per spouse was confused wtih R weakness, wordfinding difficulties an R visual field deficit. CTA with L PCA and L cerebellar infarct. PMH includes HTN, HLD, CAD, CKD IV, CHF, HM III LVAD     Clinical Impressions Pt ind at baseline with ADL/functional mobility, lives with spouse who can assist at d/c. Pt currently needing min-max A for ADL, min-mod +2 for bed mobility and transfers with 2 person HHA. Pt with apparent R visual field deficit, and decr attention to R side throughout session needing verbal and tactile cues to "move your R side". Pt presenting with impairments listed below, will follow acutely. Patient will benefit from intensive inpatient follow-up therapy, >3 hours/day to maximize safety/ind with ADL/functional mobility.      If plan is discharge home, recommend the following:   A lot of help with walking and/or transfers;A lot of help with bathing/dressing/bathroom;Assistance with cooking/housework;Direct supervision/assist for medications management;Direct supervision/assist for financial management;Assist for transportation;Help with stairs or ramp for entrance;Supervision due to cognitive status     Functional Status Assessment   Patient has had a recent decline in their functional status and demonstrates the ability to make significant improvements in function in a reasonable and predictable amount of time.     Equipment Recommendations   Other (comment) (defer)     Recommendations for Other Services   PT consult;Rehab consult     Precautions/Restrictions   Precautions Precautions: Fall Precaution/Restrictions Comments: LVAD Restrictions Weight Bearing Restrictions Per Provider Order: No     Mobility Bed  Mobility Overal bed mobility: Needs Assistance Bed Mobility: Supine to Sit     Supine to sit: Min assist, Mod assist     General bed mobility comments: min physical assist, mod cueing to scoot to EOB    Transfers Overall transfer level: Needs assistance Equipment used: 2 person hand held assist Transfers: Sit to/from Stand, Bed to chair/wheelchair/BSC Sit to Stand: Min assist, Mod assist, +2 physical assistance     Step pivot transfers: Min assist, Mod assist, +2 physical assistance            Balance Overall balance assessment: Needs assistance Sitting-balance support: Feet supported Sitting balance-Leahy Scale: Fair     Standing balance support: Reliant on assistive device for balance, During functional activity Standing balance-Leahy Scale: Poor Standing balance comment: reliant on external support                           ADL either performed or assessed with clinical judgement   ADL Overall ADL's : Needs assistance/impaired Eating/Feeding: Minimal assistance   Grooming: Minimal assistance;Sitting   Upper Body Bathing: Moderate assistance;Sitting;Standing   Lower Body Bathing: Moderate assistance;Sitting/lateral leans;Sit to/from stand   Upper Body Dressing : Moderate assistance;Sitting;Standing   Lower Body Dressing: Moderate assistance;Sitting/lateral leans;Sit to/from stand   Toilet Transfer: Minimal assistance;+2 for physical assistance;Moderate assistance   Toileting- Clothing Manipulation and Hygiene: Maximal assistance       Functional mobility during ADLs: Minimal assistance;+2 for physical assistance       Vision Baseline Vision/History: 1 Wears glasses Vision Assessment?: Yes Eye Alignment: Within Functional Limits Alignment/Gaze Preference: Within Defined Limits Tracking/Visual Pursuits: Decreased smoothness of eye movement to RIGHT inferior field;Decreased smoothness of eye movement to RIGHT superior  field Additional  Comments: difficulty following commands for visual assessment, incorrectly identifies number help up in far side of R visual field     Perception Perception: Impaired Preception Impairment Details: Inattention/Neglect Perception-Other Comments: R   Praxis         Pertinent Vitals/Pain Pain Assessment Pain Assessment: No/denies pain     Extremity/Trunk Assessment Upper Extremity Assessment Upper Extremity Assessment: Generalized weakness;Right hand dominant;RUE deficits/detail RUE Deficits / Details: proximal vs distal weakness, overall 3/5 globally RUE Coordination: decreased fine motor;decreased gross motor   Lower Extremity Assessment Lower Extremity Assessment: Defer to PT evaluation   Cervical / Trunk Assessment Cervical / Trunk Assessment: Normal   Communication Communication Communication: No apparent difficulties   Cognition Arousal: Lethargic Behavior During Therapy: Flat affect Cognition: Cognition impaired   Orientation impairments: Time, Situation Awareness: Online awareness impaired Memory impairment (select all impairments): Short-term memory, Declarative long-term memory, Working memory Attention impairment (select first level of impairment): Sustained attention Executive functioning impairment (select all impairments): Problem solving, Reasoning OT - Cognition Comments: pt unaware of date, overall slow to process, delayed command follow                 Following commands: Impaired Following commands impaired: Follows one step commands inconsistently     Cueing  General Comments   Cueing Techniques: Verbal cues  VSS   Exercises     Shoulder Instructions      Home Living Family/patient expects to be discharged to:: Private residence Living Arrangements: Spouse/significant other Available Help at Discharge: Family Type of Home: House Home Access: Stairs to enter Secretary/administrator of Steps: 1   Home Layout: Two level;Bed/bath  upstairs Alternate Level Stairs-Number of Steps: flight Alternate Level Stairs-Rails: Right Bathroom Shower/Tub: Producer, television/film/video: Standard     Home Equipment: Rollator (4 wheels)      Lives With: Spouse    Prior Functioning/Environment Prior Level of Function : Independent/Modified Independent             Mobility Comments: no AD use ADLs Comments: ind    OT Problem List: Decreased strength;Decreased range of motion;Decreased activity tolerance;Impaired balance (sitting and/or standing);Decreased cognition;Decreased safety awareness;Impaired vision/perception;Decreased coordination   OT Treatment/Interventions: Self-care/ADL training;Therapeutic exercise;Energy conservation;DME and/or AE instruction;Therapeutic activities;Patient/family education;Balance training      OT Goals(Current goals can be found in the care plan section)   Acute Rehab OT Goals Patient Stated Goal: none stated OT Goal Formulation: With patient Time For Goal Achievement: 02/21/24 Potential to Achieve Goals: Good ADL Goals Pt Will Perform Grooming: with modified independence;sitting Pt Will Perform Upper Body Dressing: with modified independence;sitting;standing Pt Will Perform Lower Body Dressing: with modified independence;sitting/lateral leans;sit to/from stand Pt Will Transfer to Toilet: with modified independence;ambulating;regular height toilet   OT Frequency:  Min 2X/week    Co-evaluation              AM-PAC OT "6 Clicks" Daily Activity     Outcome Measure Help from another person eating meals?: A Little Help from another person taking care of personal grooming?: A Little Help from another person toileting, which includes using toliet, bedpan, or urinal?: A Lot Help from another person bathing (including washing, rinsing, drying)?: A Lot Help from another person to put on and taking off regular upper body clothing?: A Little Help from another person to put on  and taking off regular lower body clothing?: A Lot 6 Click Score: 15   End of Session Equipment Utilized During Treatment: Gait  belt Nurse Communication: Mobility status  Activity Tolerance: Patient tolerated treatment well Patient left: with call bell/phone within reach;in chair;with chair alarm set;with family/visitor present  OT Visit Diagnosis: Unsteadiness on feet (R26.81);Other abnormalities of gait and mobility (R26.89);Muscle weakness (generalized) (M62.81);Other symptoms and signs involving cognitive function;Other symptoms and signs involving the nervous system (R29.898)                Time: 1610-9604 OT Time Calculation (min): 29 min Charges:  OT General Charges $OT Visit: 1 Visit OT Evaluation $OT Eval Moderate Complexity: 1 Mod  Glendal Cassaday K, OTD, OTR/L SecureChat Preferred Acute Rehab [phone]   Benedict Brain Koonce 02/07/2024, 12:50 PM

## 2024-02-07 NOTE — Progress Notes (Signed)
 Stroke Expertise provided to patient. Resource guide reviewed with nurse for applicable diagnosis. Appropriate stroke care and orders confirmed in place.  For any questions related to patient's stroke care, please call:  Stroke Response Nurse (Monday - Friday 0700-1900): [phone]  [phone] Nurse (Nights and Weekends): [phone]

## 2024-02-07 NOTE — Evaluation (Signed)
 Clinical/Bedside Swallow Evaluation Patient Details  Name: Isaac [name] MRN: [id number] Date of Birth: 04-23-1951  Today's Date: 02/07/2024 Time: SLP Start Time (ACUTE ONLY): 0945 SLP Stop Time (ACUTE ONLY): 1010 SLP Time Calculation (min) (ACUTE ONLY): 25 min  Past Medical History:  Past Medical History:  Diagnosis Date   Arrhythmia    atrial fibrillation   CHF (congestive heart failure) (HCC)    Chronic kidney disease    Coronary artery disease    Hyperlipidemia    Hypertension    Myocardial infarct [facility])    Past Surgical History:  Past Surgical History:  Procedure Laterality Date   CARDIAC DEFIBRILLATOR PLACEMENT  feb 2014   INSERTION OF IMPLANTABLE LEFT VENTRICULAR ASSIST DEVICE N/A 09/17/2021   Procedure: INSERTION OF IMPLANTABLE LEFT VENTRICULAR ASSIST DEVICE AND INSERTION OF FEMORAL ARTERIAL LINE;  Surgeon: Bartley Lightning, MD;  Location: [facility] OR;  Service: Open Heart Surgery;  Laterality: N/A;   IR FLUORO GUIDE CV LINE RIGHT  09/27/2021   IR THORACENTESIS ASP PLEURAL SPACE W/IMG GUIDE  09/27/2021   IR US  GUIDE VASC ACCESS RIGHT  09/27/2021   PLACEMENT OF IMPELLA LEFT VENTRICULAR ASSIST DEVICE N/A 09/12/2021   Procedure: PLACEMENT OF IMPELLA 5.5 LEFT VENTRICULAR ASSIST DEVICE;  Surgeon: Zelphia Higashi, MD;  Location: [facility] OR;  Service: Open Heart Surgery;  Laterality: N/A;   RIGHT HEART CATH N/A 09/08/2021   Procedure: RIGHT HEART CATH;  Surgeon: Darlis Eisenmenger, MD;  Location: [facility] INVASIVE CV LAB;  Service: Cardiovascular;  Laterality: N/A;   TEE WITHOUT CARDIOVERSION N/A 09/12/2021   Procedure: TRANSESOPHAGEAL ECHOCARDIOGRAM (TEE);  Surgeon: Zelphia Higashi, MD;  Location: [facility] OR;  Service: Open Heart Surgery;  Laterality: N/A;   TEE WITHOUT CARDIOVERSION N/A 09/17/2021   Procedure: TRANSESOPHAGEAL ECHOCARDIOGRAM (TEE);  Surgeon: Bartley Lightning, MD;  Location: [facility] OR;  Service: Open Heart Surgery;  Laterality: N/A;   HPI:  73 year old African-American male with  cardioembolic left cerebellar, 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 Repeat Ct head 02/06/24 evolving left cerebellar, left PCA and right frontal gyrus MCA branch infarct.  No hemorrhage.  MRI cannot be done when patient has LVAD device. Pt had esophagram on 04/27/23 due to complaint of chest tightness while eating. No penetration or aspiration, small volume reflux.    Assessment / Plan / Recommendation  Clinical Impression  Pt demonstrates tolerance of liquid and solids and endorses an appetite. During assessment pt exhibited no coughing or oral holding. Pt did have difficulty sustaining attention to motor tasks if there was distraction. Reducing distraction, provided verbal reminders to stay on task and giving pt time to problem solve use of utensils and self feeding kept him attentive. No oral holding observed. Pt recommended to resume a regular diet with assisted self feeding as able. See next note to address cognitive linguistic function. SLP Visit Diagnosis: Dysphagia, unspecified (R13.10)    Aspiration Risk  Mild aspiration risk    Diet Recommendation Regular;Thin liquid    Liquid Administration via: Straw;Cup Medication Administration: Whole meds with liquid Supervision: Staff to assist with self feeding Compensations: Slow rate;Small sips/bites Postural Changes: Seated upright at 90 degrees    Other  Recommendations Oral Care Recommendations: Patient independent with oral care    Recommendations for follow up therapy are one component of a multi-disciplinary discharge planning process, led by the attending physician.  Recommendations may be updated based on patient status, additional functional criteria and insurance  authorization.  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 Repeat Ct head 02/06/24 evolving left cerebellar, left PCA and right frontal gyrus MCA branch infarct.  No hemorrhage.  MRI cannot be done when patient has LVAD device. Pt had esophagram on 04/27/23 due to complaint of chest tightness while eating. No penetration or aspiration, small volume reflux. Type of Study: Bedside Swallow Evaluation Previous Swallow Assessment: none Diet Prior to this Study: NPO Temperature Spikes Noted: No Respiratory Status: Room air History of Recent Intubation: No Behavior/Cognition: Alert;Cooperative Oral Cavity Assessment: Within Functional Limits Oral Care Completed by SLP: No Oral Cavity - Dentition: Dentures, bottom Vision: Functional for self-feeding Self-Feeding Abilities: Able to feed self Patient Positioning: Upright in bed Baseline Vocal Quality: Normal Volitional Cough: Strong Volitional Swallow: Able to elicit    Oral/Motor/Sensory Function     Ice Chips     Thin Liquid Thin Liquid: Within functional limits    Nectar Thick Nectar Thick Liquid: Not tested   Honey Thick Honey Thick Liquid: Not tested   Puree Puree: Within functional limits   Solid     Solid: Within functional limits      Burnard Enis, Hardin Leys 02/07/2024,10:59 AM

## 2024-02-07 NOTE — Plan of Care (Signed)
  Problem: Education: Goal: Patient will understand all VAD equipment and how it functions Outcome: Progressing Goal: Patient will be able to verbalize current INR target range and antiplatelet therapy for discharge home Outcome: Progressing   Problem: Cardiac: Goal: LVAD will function as expected and patient will experience no clinical alarms Outcome: Progressing   

## 2024-02-07 NOTE — Progress Notes (Signed)
 STROKE TEAM PROGRESS NOTE    Subjective : Patient is sitting up in bed resting comfortably.  His wife is at the bedside.  His speech and aphasia is much improved and is able to speak sentences now and can comprehend much better.  He still has some word finding difficulties and word hesitancy.  Right-sided peripheral vision loss persist.  INR is 2.3 this morning.    Echocardiogram shows EF 25 to 30% with LVAD at the apex.  Left atrial is moderately dilated    02/07/2024   11:52 AM 02/07/2024   11:00 AM 02/07/2024   10:00 AM  Vitals with BMI  Systolic 110 118 97  Diastolic 91 75 78  Pulse  53 71    Objective : Patient is awake alert.   Speech is much improved and can speak sentences but does have word hesitancy..  .  He has some left gaze preference but can look to the right all the way.  He has dense right homonymous hemianopsia.  Subtle right lower facial weakness.  Tongue midline.  No upper or lower extremity drift but diminished fine finger movements on the right not deflective right upper extremity.  Trace weakness of right lower extremity with no drift.  Mentation likely diminished on the right.  Gait not tested.  Repeat Ct head 02/06/24 evolving left cerebellar, left PCA and right frontal gyrus MCA branch infarct.  No hemorrhage. MRI cannot be done when patient has LVAD device CT angio distal left P2/P3 occlusion Echo EF 25 to 30%.  LVAD in apex.  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  PLAN :  Continue warfarin with INR goal between 2.5-3.0.  Mobilize out of bed.  Therapy consults.   Continue physical occupational and speech therapy consults.  Long discussion with patient and wife at the bedside and answered questions.  Discussed with Dr. Alease Amend  heart failure team.  Stroke team will sign off.  Follow-up as an outpatient stroke clinic in 2 months.  Greater than 50% time during this 35-minute visit was spent in counseling and coordination of care and discussion with patient and wife and care team and answering questions.    Ardella Beaver, MD

## 2024-02-07 NOTE — Progress Notes (Addendum)
 LVAD Coordinator Rounding Note:  Pt admitted 02/05/24 for altered mental status. Code Stroke activated. CT showed Left PCA and left cerebellar infarct. Pt not candidate for thrombolytic therapy or IR due to the occlusion being too distal.  STAT CT head 1. 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 Hyperdense Left PCA suggests occlusion of that vessel. No hemorrhagic transformation or mass effect. 2. Underlying advanced chronic small vessel disease. 3. Right supraorbital/forehead recent soft tissue injury with no skull fracture identified.  Pt sitting in up in bed on my arrival. His wife reports he just finished his evaluation with speech therapy and is able to resume a regular diet with assistance. Pt continues to have right sided deficits and aphasia. Pt recognized this VAD Coordinator and attempted to have a conversation but was very tearful. Emotional support provided to patient and his wife [name].  Goal INR now 2.5-3.0 per Neurology.  PT to work with patient at 37 and suggest rehab recommendations. Pt to move to 2C.  Vital signs: Temp: 98 HR: 50 V paced Doppler Pressure: 80 Auto Cuff BP: 94/82 (87) O2 Sat: 98% on RA Wt: 184>183.2>182.9  lbs    LVAD interrogation reveals:  Speed: 5300 Flow: 4.7 Power: 3.8w PI: 2.8 Hematocrit: 34  Alarms: none Events:   Fixed speed: 5300 Low speed limit: 5000  Drive Line:  Existing VAD dressing C/D/I with anchor intact and accurately applied. Weekly dressing changes per VAD coordinator, or bedside nurse. Next dressing change due: 02/12/24   Labs:  LDH trend: 161>175  INR trend: 2.2>2.6>2.3  Anticoagulation Plan: -INR Goal: 2.5-3.0  Device: - [employer] dual - AV paced 60 -Therapies: ERI  Patient seen in [facility] last week for suspected ICD shock.Unable to collection interrogation as ICD is ERI. Pt originally elected to not move forward with a generator change; however, is currently pacer  dependent. AHF team consulted EP to consult for generator change this admission.  Infection:   Plan/Recommendations:  Page VAD coordinator for drive line or equipment concerns 2. Weekly dressing changes.  Laurice Pope RN,BSN VAD Coordinator  Office: [phone]  24/7 Pager: [phone]

## 2024-02-07 NOTE — Progress Notes (Signed)
 HV therapies discontinued.  Generator EOL with no plan to replace.    Approved by VAD/Dr. Mitzie Anda.

## 2024-02-07 NOTE — Evaluation (Signed)
 Physical Therapy Evaluation Patient Details Name: Isaac [name] MRN: [id number] DOB: 1950-10-03 Today's Date: 02/07/2024  History of Present Illness  Pt is a 73 y/o M presenting to ED with concernsf or his defibrillator activating, per spouse was confused wtih R weakness, wordfinding difficulties an R visual field deficit. CTA with L PCA and L cerebellar infarct. PMH includes HTN, HLD, CAD, CKD IV, CHF, HM III LVAD   Clinical Impression  Pt presenting with R inattention, R visual field cut, R sided weakness, impaired cognition with decreased comprehension what brought him to the hospital but did state he was at the hospital,  delayed processing, difficulty initiating at times and requires assistx2 for mobility. PTA pt was indep without AD. Recommend inpatient rehab program > 3 hrs a day to achieve maximal functional return to return home with spouse and return to working outside in his flower beds. Acute PT to cont to follow.        If plan is discharge home, recommend the following: A little help with walking and/or transfers;A lot of help with bathing/dressing/bathroom;Assist for transportation;Help with stairs or ramp for entrance;Direct supervision/assist for medications management   Can travel by private vehicle        Equipment Recommendations  (TBD at next venue)  Recommendations for Other Services  Rehab consult    Functional Status Assessment Patient has had a recent decline in their functional status and demonstrates the ability to make significant improvements in function in a reasonable and predictable amount of time.     Precautions / Restrictions Precautions Precautions: Fall Precaution/Restrictions Comments: LVAD, R neglect Restrictions Weight Bearing Restrictions Per Provider Order: No      Mobility  Bed Mobility Overal bed mobility: Needs Assistance Bed Mobility: Supine to Sit     Supine to sit: Min assist, Mod assist     General bed mobility comments:  min physical assist, mod cueing to scoot to EOB    Transfers Overall transfer level: Needs assistance Equipment used: 2 person hand held assist Transfers: Sit to/from Stand, Bed to chair/wheelchair/BSC Sit to Stand: Min assist, Mod assist, +2 physical assistance   Step pivot transfers: Mod assist, +2 physical assistance       General transfer comment: max directional verbal cues to sequence stepping, modA to advance R LE, not R knee buckling but did maintain R knee flexed during transfer, assist for weight shift    Ambulation/Gait               General Gait Details: limited to ambulation to chair  Stairs            Wheelchair Mobility     Tilt Bed    Modified Rankin (Stroke Patients Only)       Balance Overall balance assessment: Needs assistance Sitting-balance support: Feet supported Sitting balance-Leahy Scale: Fair     Standing balance support: Reliant on assistive device for balance, During functional activity Standing balance-Leahy Scale: Poor Standing balance comment: reliant on external support                             Pertinent Vitals/Pain      Home Living Family/patient expects to be discharged to:: Private residence Living Arrangements: Spouse/significant other Available Help at Discharge: Family Type of Home: House Home Access: Stairs to enter   Secretary/administrator of Steps: 1 Alternate Level Stairs-Number of Steps: flight Home Layout: Two level;Bed/bath upstairs Home Equipment: Rollator (4 wheels)  Prior Function Prior Level of Function : Independent/Modified Independent             Mobility Comments: no AD use ADLs Comments: ind     Extremity/Trunk Assessment   Upper Extremity Assessment Upper Extremity Assessment: Defer to OT evaluation RUE Deficits / Details: proximal vs distal weakness, overall 3/5 globally RUE Coordination: decreased fine motor;decreased gross motor    Lower Extremity  Assessment Lower Extremity Assessment: RLE deficits/detail RLE Deficits / Details: grossly 3-/5 impaired sensation and repsonse to noxious stimuli    Cervical / Trunk Assessment Cervical / Trunk Assessment: Normal  Communication   Communication Communication: No apparent difficulties    Cognition Arousal: Lethargic Behavior During Therapy: Flat affect                           PT - Cognition Comments: R sided inattention Following commands: Impaired Following commands impaired: Follows one step commands inconsistently     Cueing Cueing Techniques: Verbal cues     General Comments General comments (skin integrity, edema, etc.): VSS    Exercises     Assessment/Plan    PT Assessment Patient needs continued PT services  PT Problem List Decreased strength;Decreased range of motion;Decreased activity tolerance;Decreased balance;Decreased mobility;Decreased coordination;Decreased cognition;Decreased knowledge of use of DME;Decreased safety awareness;Impaired sensation       PT Treatment Interventions DME instruction;Gait training;Stair training;Functional mobility training;Therapeutic activities;Therapeutic exercise;Balance training;Neuromuscular re-education    PT Goals (Current goals can be found in the Care Plan section)  Acute Rehab PT Goals Patient Stated Goal: didn't state PT Goal Formulation: With patient/family Time For Goal Achievement: 02/21/24 Potential to Achieve Goals: Good    Frequency Min 2X/week     Co-evaluation PT/OT/SLP Co-Evaluation/Treatment: Yes Reason for Co-Treatment: Complexity of the patient's impairments (multi-system involvement) PT goals addressed during session: Mobility/safety with mobility         AM-PAC PT "6 Clicks" Mobility  Outcome Measure Help needed turning from your back to your side while in a flat bed without using bedrails?: A Lot Help needed moving from lying on your back to sitting on the side of a flat bed  without using bedrails?: A Lot Help needed moving to and from a bed to a chair (including a wheelchair)?: A Lot Help needed standing up from a chair using your arms (e.g., wheelchair or bedside chair)?: A Lot Help needed to walk in hospital room?: A Lot Help needed climbing 3-5 steps with a railing? : A Lot 6 Click Score: 12    End of Session Equipment Utilized During Treatment: Gait belt;Oxygen Activity Tolerance: Patient limited by fatigue Patient left: in chair;with call bell/phone within reach;with chair alarm set;with nursing/sitter in room;with family/visitor present Nurse Communication: Mobility status PT Visit Diagnosis: Unsteadiness on feet (R26.81);Hemiplegia and hemiparesis Hemiplegia - Right/Left: Right Hemiplegia - dominant/non-dominant: Dominant Hemiplegia - caused by: Cerebral infarction    Time: 1136-1203 PT Time Calculation (min) (ACUTE ONLY): 27 min   Charges:   PT Evaluation $PT Eval Moderate Complexity: 1 Mod   PT General Charges $$ ACUTE PT VISIT: 1 Visit         Renaee Caro, PT, DPT Acute Rehabilitation Services Secure chat preferred Office #: [phone]   Jenna Moan 02/07/2024, 2:40 PM

## 2024-02-07 NOTE — Patient Instructions (Signed)
 Follow up as needed

## 2024-02-07 NOTE — TOC CAGE-AID Note (Signed)
 Transition of Care [facility]) - CAGE-AID Screening   Patient Details  Name: Isaac [name] MRN: [id number] Date of Birth: Feb 10, 1951  [facility]) CM/SW Contact:    Inocencia Murtaugh E Tashanti Dalporto, LCSW Phone Number: 02/07/2024, 10:22 AM   Clinical Narrative:    CAGE-AID Screening:    Have You Ever Felt You Ought to Cut Down on Your Drinking or Drug Use?: No Have People Annoyed You By [employer] Your Drinking Or Drug Use?: No Have You Felt Bad Or Guilty About Your Drinking Or Drug Use?: No Have You Ever Had a Drink or Used Drugs First Thing In The Morning to Steady Your Nerves or to Get Rid of a Hangover?: No CAGE-AID Score: 0  Substance Abuse Education Offered: No

## 2024-02-08 DIAGNOSIS — I639 Cerebral infarction, unspecified: Secondary | ICD-10-CM

## 2024-02-08 DIAGNOSIS — Z95811 Presence of heart assist device: Secondary | ICD-10-CM | POA: Diagnosis not present

## 2024-02-08 DIAGNOSIS — I482 Chronic atrial fibrillation, unspecified: Secondary | ICD-10-CM

## 2024-02-08 DIAGNOSIS — I5022 Chronic systolic (congestive) heart failure: Secondary | ICD-10-CM | POA: Diagnosis not present

## 2024-02-08 DIAGNOSIS — I63532 Cerebral infarction due to unspecified occlusion or stenosis of left posterior cerebral artery: Secondary | ICD-10-CM

## 2024-02-08 DIAGNOSIS — R001 Bradycardia, unspecified: Secondary | ICD-10-CM | POA: Diagnosis not present

## 2024-02-08 LAB — CBC
HCT: 38.9 % — ABNORMAL LOW (ref 39.0–52.0)
Hemoglobin: 12.5 g/dL — ABNORMAL LOW (ref 13.0–17.0)
MCH: 29.3 pg (ref 26.0–34.0)
MCHC: 32.1 g/dL (ref 30.0–36.0)
MCV: 91.3 fL (ref 80.0–100.0)
Platelets: 183 10*3/uL (ref 150–400)
RBC: 4.26 MIL/uL (ref 4.22–5.81)
RDW: 15.4 % (ref 11.5–15.5)
WBC: 4.5 10*3/uL (ref 4.0–10.5)
nRBC: 0 % (ref 0.0–0.2)

## 2024-02-08 LAB — BASIC METABOLIC PANEL WITH GFR
Anion gap: 10 (ref 5–15)
BUN: 30 mg/dL — ABNORMAL HIGH (ref 8–23)
CO2: 18 mmol/L — ABNORMAL LOW (ref 22–32)
Calcium: 8.6 mg/dL — ABNORMAL LOW (ref 8.9–10.3)
Chloride: 108 mmol/L (ref 98–111)
Creatinine, Ser: 2.3 mg/dL — ABNORMAL HIGH (ref 0.61–1.24)
GFR, Estimated: 30 mL/min — ABNORMAL LOW (ref >60)
Glucose, Bld: 92 mg/dL (ref 70–99)
Potassium: 4.3 mmol/L (ref 3.5–5.1)
Sodium: 136 mmol/L (ref 135–145)

## 2024-02-08 LAB — LACTATE DEHYDROGENASE: LDH: 154 U/L (ref 98–192)

## 2024-02-08 LAB — PROTIME-INR
INR: 2.7 — ABNORMAL HIGH (ref 0.8–1.2)
Prothrombin Time: 28.8 s — ABNORMAL HIGH (ref 11.4–15.2)

## 2024-02-08 MED ORDER — WARFARIN SODIUM 1 MG PO TABS
1.0000 mg | ORAL_TABLET | Freq: Once | ORAL | Status: AC
  Administered 2024-02-08: 1 mg via ORAL
  Filled 2024-02-08: qty 1

## 2024-02-08 MED ORDER — WARFARIN SODIUM 2 MG PO TABS: 2.0000 mg | ORAL_TABLET | Freq: Once | ORAL | Status: DC

## 2024-02-08 MED ORDER — SILDENAFIL CITRATE 20 MG PO TABS
20.0000 mg | ORAL_TABLET | Freq: Three times a day (TID) | ORAL | Status: DC
  Administered 2024-02-08 – 2024-02-10 (×7): 20 mg via ORAL
  Filled 2024-02-08 (×7): qty 1

## 2024-02-08 NOTE — PMR Pre-admission (Signed)
 PMR Admission Coordinator Pre-Admission Assessment   Patient: Isaac [name] is an 73 y.o., male MRN: [id number] DOB: 23-Jul-1951 Height: 5\' 9"  (175.3 cm) Weight: 85 kg   Insurance Information HMO:   yes  PPO:  yes    PCP:      IPA:      80/20:      OTHER:  PRIMARY: [employer] Medicare HMO/PPO      Policy#: [id number]   Subscriber: Pt CM Name: Jyl Or      Phone#: [phone]     Fax#: [phone]  Tiffany with [employer] called with approval for admit on 5/21 for 8/41-3/24 Pre-Cert#: [id number]      Employer: Retired Benefits:  Phone #: [phone]     Name: Checked on [email or web address] Fax: [phone] Phone: [phone] Eff Date: 09/22/2023 - still active Deductible: does not have one OOP Max: $9,350 ($133.65 met)  CIR: 100% coverage  SNF: 100% coverage Outpatient:  100% coverage  Home Health:  100% coverage  DME: 100% coverage  Providers: in network    SECONDARY:       Policy#:      Phone#:    Artist:       Phone#:    The Data processing manager" for patients in Inpatient Rehabilitation Facilities with attached "Privacy Act Statement-Health Care Records" was provided and verbally reviewed with: Family   Emergency Contact Information Contact Information       Name Relation Home Work Mobile    [name],[name] Spouse     [phone]    Orbie Binder     [phone]    Berhow,Yumeca Daughter     [phone]    Agapito Alcide     [phone]         Other Contacts   None on File        Current Medical History  Patient Admitting Diagnosis: L PCA, L cerebellar, and R MCA strokes   History of Present Illness: Isaac [name] is a 73 y.o. with history of CAD, HTN, atrial fibrillation, CKD IV, VT and chronic systolic CHFs/p HM-3 LVAD on 09/17/21.  He presented to the [facility] ED on 02/05/24 for focal neuro deficits with R vision field cut and dysmetria. Code Stroke activated. CT brain positive for acute to subacute appearing Left PCA  and Left cerebellar infarcts. Hyperdense Left PCA suggests occlusion of that vessel. No hemorrhagic transformation or mass effect. Seen by Neuro and decision made to treat with IV heparin . Echocardiogram shows EF 25 to 30% with LVAD at the apex.  Left atrial is moderately dilate. CT angio distal left P2/P3 occlusion.Repeat Ct head 02/06/24 with evolving left cerebellar, left PCA and right frontal gyrus MCA branch infarct. No hemorrhage.  Neurology feels infarcts 2/2 LVAD and signed off. Pt. Seen by PT/OT/SLP and they recommended CIR to assist return to PLOF.     Complete NIHSS TOTAL: 8   Patient's medical record from [facility] has been reviewed by the rehabilitation admission coordinator and physician.   Past Medical History      Past Medical History:  Diagnosis Date   Arrhythmia      atrial fibrillation   CHF (congestive heart failure) (HCC)     Chronic kidney disease     Coronary artery disease     Hyperlipidemia     Hypertension     Myocardial infarct [facility])            Has the patient had major surgery during 100 days prior to  admission? No   Family History   family history includes Heart attack in his father; Heart failure in his father; Hyperlipidemia in his father; Hypertension in his father and mother.   Current Medications  Current Medications    Current Facility-Administered Medications:    acetaminophen  (TYLENOL ) tablet 650 mg, 650 mg, Oral, Q4H PRN, Bensimhon, Daniel R, MD   Chlorhexidine  Gluconate Cloth 2 % PADS 6 each, 6 each, Topical, Daily, Bensimhon, Daniel R, MD, 6 each at 02/08/24 1000   feeding supplement (ENSURE ENLIVE / ENSURE PLUS) liquid 237 mL, 237 mL, Oral, BID BM, Bensimhon, Rheta Celestine, MD, 237 mL at 02/08/24 1231   furosemide  (LASIX ) tablet 40 mg, 40 mg, Oral, QODAY, Lauralee Poll, MD, 40 mg at 02/08/24 1100   magnesium  oxide (MAG-OX) tablet 400 mg, 400 mg, Oral, Daily, Bensimhon, Daniel R, MD, 400 mg at 02/08/24 1100   ondansetron   (ZOFRAN ) injection 4 mg, 4 mg, Intravenous, Q6H PRN, Bensimhon, Daniel R, MD, 4 mg at 02/06/24 2059   ondansetron  (ZOFRAN ) tablet 4 mg, 4 mg, Oral, Q8H PRN, Bensimhon, Daniel R, MD   Oral care mouth rinse, 15 mL, Mouth Rinse, PRN, Bensimhon, Daniel R, MD   Oral care mouth rinse, 15 mL, Mouth Rinse, PRN, Bensimhon, Daniel R, MD   pantoprazole  (PROTONIX ) EC tablet 40 mg, 40 mg, Oral, Daily, Bensimhon, Daniel R, MD, 40 mg at 02/08/24 1100   potassium chloride  SA (KLOR-CON  M) CR tablet 20 mEq, 20 mEq, Oral, Daily, Raliegh Burgess, RPH, 20 mEq at 02/08/24 1100   prochlorperazine  (COMPAZINE ) injection 10 mg, 10 mg, Intravenous, Q6H PRN, Chauncey Cora, MD, 10 mg at 02/07/24 0025   rosuvastatin  (CRESTOR ) tablet 10 mg, 10 mg, Oral, QHS, Bensimhon, Daniel R, MD, 10 mg at 02/07/24 2159   sildenafil  (REVATIO ) tablet 20 mg, 20 mg, Oral, TID, Bensimhon, Daniel R, MD, 20 mg at 02/08/24 4098   warfarin (COUMADIN ) tablet 2 mg, 2 mg, Oral, ONCE-1600, Bitonti, Michael T, [facility]   Warfarin - Pharmacist Dosing Inpatient, , Does not apply, q1600, Bensimhon, Rheta Celestine, MD, Given at 02/07/24 1644     Patients Current Diet:  Diet Order                  Diet regular Room service appropriate? Yes; Fluid consistency: Thin  Diet effective now                         Precautions / Restrictions Precautions Precautions: Fall Precaution/Restrictions Comments: LVAD, R neglect Restrictions Weight Bearing Restrictions Per Provider Order: No    Has the patient had 2 or more falls or a fall with injury in the past year? No   Prior Activity Level Community (5-7x/wk): Pt. active in the community PTA   Prior Functional Level Self Care: Did the patient need help bathing, dressing, using the toilet or eating? Independent   Indoor Mobility: Did the patient need assistance with walking from room to room (with or without device)? Independent   Stairs: Did the patient need assistance with internal or external stairs  (with or without device)? Independent   Functional Cognition: Did the patient need help planning regular tasks such as shopping or remembering to take medications? Independent   Patient Information Are you of Hispanic, Latino/a,or Spanish origin?: A. No, not of Hispanic, Latino/a, or Spanish origin (proxy) What is your race?: B. Black or African American Do you need or want an interpreter to communicate with a doctor or  health care staff?: 0. No   Patient's Response To:  Health Literacy and Transportation Is the patient able to respond to health literacy and transportation needs?: No Health Literacy - How often do you need to have someone help you when you read instructions, pamphlets, or other written material from your doctor or pharmacy?: Patient unable to respond In the past 12 months, has lack of transportation kept you from medical appointments or from getting medications?: No (proxy) In the past 12 months, has lack of transportation kept you from meetings, work, or from getting things needed for daily living?: No   Home Assistive Devices / Equipment Home Equipment: Rollator (4 wheels)   Prior Device Use: Indicate devices/aids used by the patient prior to current illness, exacerbation or injury? None of the above   Current Functional Level Cognition   Arousal/Alertness: Lethargic Overall Cognitive Status: Impaired/Different from baseline Orientation Level: Oriented to person, Oriented to place, Disoriented to time, Disoriented to situation Attention: Focused, Sustained, Selective Focused Attention: Appears intact Sustained Attention: Impaired Sustained Attention Impairment: Verbal basic, Functional basic Selective Attention: Impaired Selective Attention Impairment: Verbal basic, Functional basic Memory:  (NT) Awareness: Impaired Awareness Impairment: Intellectual impairment, Emergent impairment, Anticipatory impairment Problem Solving: Impaired Problem Solving Impairment:  Functional basic, Verbal basic    Extremity Assessment (includes Sensation/Coordination)   Upper Extremity Assessment: Defer to OT evaluation RUE Deficits / Details: proximal vs distal weakness, overall 3/5 globally RUE Coordination: decreased fine motor, decreased gross motor  Lower Extremity Assessment: RLE deficits/detail RLE Deficits / Details: grossly 3-/5 impaired sensation and repsonse to noxious stimuli     ADLs   Overall ADL's : Needs assistance/impaired Eating/Feeding: Minimal assistance Grooming: Minimal assistance, Sitting Upper Body Bathing: Moderate assistance, Sitting, Standing Lower Body Bathing: Moderate assistance, Sitting/lateral leans, Sit to/from stand Upper Body Dressing : Moderate assistance, Sitting, Standing Lower Body Dressing: Moderate assistance, Sitting/lateral leans, Sit to/from stand Toilet Transfer: Minimal assistance, +2 for physical assistance, Moderate assistance Toileting- Clothing Manipulation and Hygiene: Maximal assistance Functional mobility during ADLs: Minimal assistance, +2 for physical assistance     Mobility   Overal bed mobility: Needs Assistance Bed Mobility: Supine to Sit Supine to sit: Min assist, Mod assist General bed mobility comments: min physical assist, mod cueing to scoot to EOB     Transfers   Overall transfer level: Needs assistance Equipment used: 2 person hand held assist Transfers: Sit to/from Stand, Bed to chair/wheelchair/BSC Sit to Stand: Min assist, Mod assist, +2 physical assistance Bed to/from chair/wheelchair/BSC transfer type:: Step pivot Step pivot transfers: Mod assist, +2 physical assistance General transfer comment: max directional verbal cues to sequence stepping, modA to advance R LE, not R knee buckling but did maintain R knee flexed during transfer, assist for weight shift     Ambulation / Gait / Stairs / Wheelchair Mobility   Ambulation/Gait General Gait Details: limited to ambulation to chair      Posture / Balance Balance Overall balance assessment: Needs assistance Sitting-balance support: Feet supported Sitting balance-Leahy Scale: Fair Standing balance support: Reliant on assistive device for balance, During functional activity Standing balance-Leahy Scale: Poor Standing balance comment: reliant on external support     Special needs/care consideration Skin LVAD dressings and Special service needs LVAD pt    Previous Home Environment (from acute therapy documentation) Living Arrangements: Spouse/significant other  Lives With: Spouse Available Help at Discharge: Family Type of Home: House Home Layout: Two level, Bed/bath upstairs Alternate Level Stairs-Rails: Right Alternate Level Stairs-Number of Steps: flight Home  Access: Stairs to enter [employer] of Steps: 1 Bathroom Shower/Tub: Engineer, manufacturing systems: Yes How Accessible: Accessible via wheelchair, Accessible via walker Home Care Services: Yes   Discharge Living Setting Plans for Discharge Living Setting: House Type of Home at Discharge: House Discharge Home Layout: Two level, 1/2 bath on main level, Bed/bath upstairs Alternate Level Stairs-Rails: Right Alternate Level Stairs-Number of Steps: flight Discharge Home Access: Stairs to enter Entrance Stairs-Rails: None Entrance Stairs-Number of Steps: 1 Discharge Bathroom Shower/Tub: Tub/shower unit Discharge Bathroom Toilet: Standard Discharge Bathroom Accessibility: Yes How Accessible: Accessible via walker, Accessible via wheelchair   Social/Family/Support Systems Patient Roles: Spouse Contact Information: [phone] Anticipated Caregiver: Deloris Anticipated Caregiver's Contact Information: 24/7 min A Caregiver Availability: 24/7 Discharge Plan Discussed with Primary Caregiver: Yes Is Caregiver In Agreement with Plan?: Yes Does Caregiver/Family have Issues with Lodging/Transportation while Pt is in  Rehab?: No   Goals Patient/Family Goal for Rehab: PT/OT/SLP supervision to Min A Expected length of stay: 14-16 days Pt/Family Agrees to Admission and willing to participate: Yes Program Orientation Provided & Reviewed with Pt/Caregiver Including Roles  & Responsibilities: Yes   Decrease burden of Care through IP rehab admission: not anticipated   Possible need for SNF placement upon discharge: not anticipated  Patient Condition: This patient's medical and functional status has changed since the consult dated: 02/07/24 in which the Rehabilitation Physician determined and documented that the patient's condition is appropriate for intensive rehabilitative care in an inpatient rehabilitation facility. See "History of Present Illness" (above) for medical update. Functional changes are: Currently requiring min to mod assist for mobility and basic ADLs. Patient's medical and functional status update has been discussed with the Rehabilitation physician and patient remains appropriate for inpatient rehabilitation. Will admit to inpatient rehab today.  Preadmission Screen Completed By:  Chilton Couch, RN, 02/10/2024 10:08 AM ______________________________________________________________________   Discussed status with Dr. Rachel Budds on 02/10/24 at 0930 and received approval for admission today.  Admission Coordinator:  Chilton Couch, time1019/Date5/22/25

## 2024-02-08 NOTE — Progress Notes (Addendum)
 PHARMACY - ANTICOAGULATION CONSULT NOTE  Pharmacy Consult for warfarin  Indication: LVAD, new acute ischemic infarct   Allergies  Allergen Reactions   Mushroom Extract Complex (Obsolete) Nausea And Vomiting   Doxycycline  Nausea And Vomiting   Neosporin [Neomycin-Bacitracin Zn-Polymyx] Hives   Tape Other (See Comments)    Some tapes/dressings can irritate the skin    Patient Measurements: Height: 5\' 9"  (175.3 cm) Weight: 85 kg (187 lb 6.3 oz) IBW/kg (Calculated) : 70.7 HEPARIN  DW (KG): 83.5  Vital Signs: Temp: 98.1 F (36.7 C) (05/20 0339) Temp Source: Oral (05/20 0339) BP: 82/65 (05/20 0600) Pulse Rate: 29 (05/20 0600)  Labs: Recent Labs    02/05/24 1106 02/05/24 1200 02/05/24 1428 02/05/24 2119 02/06/24 0334 02/07/24 0344 02/08/24 0249  HGB 13.1   < >  --   --  11.6* 13.5 12.5*  HCT 40.8   < >  --   --  35.2* 42.5 38.9*  PLT 171  --   --   --  145* 164 183  APTT 41*  --   --   --   --   --   --   LABPROT 24.6*  --   --   --  28.3* 25.4* 28.8*  INR 2.2*  --   --   --  2.6* 2.3* 2.7*  HEPARINUNFRC  --   --   --  0.20* 0.32  --   --   CREATININE 2.48*   < >  --   --  2.40* 2.33* 2.30*  TROPONINIHS 133*  --  185*  --   --   --   --    < > = values in this interval not displayed.    Estimated Creatinine Clearance: 31.8 mL/min (A) (by C-G formula based on SCr of 2.3 mg/dL (H)).   Medical History: Past Medical History:  Diagnosis Date   Arrhythmia    atrial fibrillation   CHF (congestive heart failure) (HCC)    Chronic kidney disease    Coronary artery disease    Hyperlipidemia    Hypertension    Myocardial infarct [facility])    Assessment: 33 yoM with HM LVAD on warfarin PTA admitted with acute CVA. Home warfarin regimen is 4mg  Mon/2mg  AODs with INR goal 2-2.5. Per HF and neurology, increase INR goal to 2.5-3 for now. Pt previously on heparin  briefly this admit.  INR 2.7 today, LDH and CBC ok. Will plan to trial 4mg  MWF and 2mg  AODs.    Goal of Therapy:   INR 2.5-3 Monitor platelets by anticoagulation protocol: Yes   Plan:  Warfarin 2mg  x1 Daily INR  ADDENDUM: EP prefers INR closer to 2 for gen change, will give 1mg  warfarin tonight.  Levin Reamer, PharmD, BCPS, [facility] Clinical Pharmacist [phone] Please check AMION for all [facility] Pharmacy numbers 02/08/2024

## 2024-02-08 NOTE — Progress Notes (Signed)
 Advanced Heart Failure VAD Team Note  PCP-Cardiologist: Peder Bourdon, MD   Chief Complaint: CVA   Patient Profile   73 y/o chronic systolic heart failure s/p HM3 LVAD, CKD Stage IV, permanent atrial fibrillation admitted w/ acute CVA, likely embolic. ICD also noted to be at EOL.   Subjective:    Feels ok but wife reports still intermittently confused. Denies dyspnea. No CP. + Constipation. No BM since admission.   LVAD INTERROGATION:  HeartMate III LVAD:   Flow 4.6 liters/min, speed 5350, power 3.8, PI 2.9.  5 PI events today   Objective:    Vital Signs:   Temp:  [97.6 F (36.4 C)-99.1 F (37.3 C)] 97.6 F (36.4 C) (05/20 0700) Pulse Rate:  [28-108] 31 (05/20 1000) Resp:  [13-21] 18 (05/20 1000) BP: (75-110)/(56-91) 86/56 (05/20 1000) SpO2:  [78 %-98 %] 96 % (05/20 1000) Weight:  [85 kg] 85 kg (05/20 0500) Last BM Date :  (PTA) Mean arterial Pressure 76  Intake/Output:   Intake/Output Summary (Last 24 hours) at 02/08/2024 1105 Last data filed at 02/08/2024 0800 Gross per 24 hour  Intake 120 ml  Output 500 ml  Net -380 ml     Physical Exam    General:  Well appearing. No resp difficulty HEENT: normal Neck: supple. JVP 7-8 cm w/ CV waves ~12 cm . Carotids 2+ bilat; no bruits. No lymphadenopathy or thyromegaly appreciated. Cor: Mechanical heart sounds with LVAD hum present. Lungs: clear Abdomen: soft, nontender, nondistended. No hepatosplenomegaly. No bruits or masses. Good bowel sounds. Driveline: C/D/I; securement device intact and driveline incorporated Extremities: no cyanosis, clubbing, rash, edema Neuro: alert & orientedx3, cranial nerves grossly intact. moves all 4 extremities w/o difficulty. Affect pleasant   Telemetry   V paced 50s, personally reviewed   EKG    N/A   Labs   Basic Metabolic Panel: Recent Labs  Lab 02/02/24 2010 02/05/24 1106 02/05/24 1200 02/06/24 0334 02/07/24 0344 02/08/24 0249  NA 140 138 141 136 141 136  K 3.3*  4.2 4.3 4.3 4.2 4.3  CL 116* 108 108 108 107 108  CO2 20* 21*  --  20* 20* 18*  GLUCOSE 93 106* 101* 96 81 92  BUN 25* 42* 40* 36* 33* 30*  CREATININE 1.69* 2.48* 2.60* 2.40* 2.33* 2.30*  CALCIUM  7.2* 9.3  --  8.7* 9.1 8.6*  MG 1.5* 2.0  --   --   --   --     Liver Function Tests: Recent Labs  Lab 02/05/24 1106  AST 26  ALT 21  ALKPHOS 92  BILITOT 1.5*  PROT 6.8  ALBUMIN  3.5   No results for input(s): "LIPASE", "AMYLASE" in the last 168 hours. No results for input(s): "AMMONIA" in the last 168 hours.  CBC: Recent Labs  Lab 02/02/24 2010 02/05/24 1106 02/05/24 1200 02/06/24 0334 02/07/24 0344 02/08/24 0249  WBC 4.0 6.2  --  7.3 5.3 4.5  NEUTROABS  --  4.5  --   --   --   --   HGB 13.7 13.1 13.9 11.6* 13.5 12.5*  HCT 42.1 40.8 41.0 35.2* 42.5 38.9*  MCV 93.1 92.1  --  90.3 93.2 91.3  PLT 200 171  --  145* 164 183    INR: Recent Labs  Lab 02/04/24 0000 02/05/24 1106 02/06/24 0334 02/07/24 0344 02/08/24 0249  INR 2.2 2.2* 2.6* 2.3* 2.7*    Other results: EKG:    Imaging   No results found.   Medications:  Scheduled Medications:  Chlorhexidine  Gluconate Cloth  6 each Topical Daily   feeding supplement  237 mL Oral BID BM   furosemide   40 mg Oral QODAY   magnesium  oxide  400 mg Oral Daily   pantoprazole   40 mg Oral Daily   potassium chloride   20 mEq Oral Daily   rosuvastatin   10 mg Oral QHS   sildenafil   20 mg Oral TID   warfarin  2 mg Oral ONCE-1600   Warfarin - Pharmacist Dosing Inpatient   Does not apply q1600    Infusions:   PRN Medications: acetaminophen , ondansetron  (ZOFRAN ) IV, ondansetron , mouth rinse, mouth rinse, prochlorperazine     Assessment/Plan:    1. 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 Hyperdense Left PCA suggests occlusion of that vessel. No hemorrhagic transformation or mass effect.  - f/u CT brain 5/18 evolution of L PCA, L  cerebellar and R MCA strokes. No hemorrhage - Echo without clear source of embolization - Seen by Neuro. Goal INR increased 2.5-3.0. INR 2.7 today - Appreciate PT/OT. Will need CIR     2. Chronic systolic CHF:  Long-standing cardiomyopathy.  [employer] ICD. S/p HM-3 VAD 09/17/21.  - NYHA I at baseline - On sildenafil  for RV dysfunction - Volume status ok, Continue PO Lasix  40 mg every other day  - VAD interrogated personally. Parameters stable. - DL ok  - ICD at EOL. Possible recent ICD shock but unclear. ICD now pacing at 50 (seems dependent). Discussed w/ EP. They will see today. Will need Gen change.    3. Tricuspid regurgitation:  Tricuspid repair not done at time of VAD due to proximity of ICD wires and hypotension during surgery.  He has severe TR.    4. Permanent Atrial fibrillation: - Atrial fibrillation now chronic, he is off amiodarone  with resolution of nausea. - Unclear underlying rhythm, likely afib   5. CKD stage IV:  - baseline Scr ~2.5.  - Avoid contrast studies as much as possible (d/w Neuro) - Scr stable 2.3 today - Consider SGLT2i prior to discharge and transition to lasix  prn - Follows with nephrology.    6. CAD: History of PCI to OM1 in 2007 and RCA in 2013.   - No s/s angina - continue statin   7. GERD with coffee ground emesis - hgb stable - continue PPI   Has transfer orders to [facility]. Will need gen change then d/c to CIR once bed available.    I reviewed the LVAD parameters from today, and compared the results to the patient's prior recorded data.  No programming changes were made.  The LVAD is functioning within specified parameters.  The patient performs LVAD self-test daily.  LVAD interrogation was negative for any significant power changes, alarms or PI events/speed drops.  LVAD equipment check completed and is in good working order.  Back-up equipment present.   LVAD education done on emergency procedures and precautions and reviewed exit site  care.  Length of Stay: [address] Elissa Guise 02/08/2024, 11:05 AM  VAD Team --- VAD ISSUES ONLY--- Pager [phone] (7am - 7am)  Advanced Heart Failure Team  Pager [phone] (M-F; 7a - 5p)  Please contact [facility] for night-coverage after hours (5p -7a ) and weekends on [email or web address]

## 2024-02-08 NOTE — Plan of Care (Signed)
  Problem: Education: Goal: Patient will understand all VAD equipment and how it functions Outcome: Progressing Goal: Patient will be able to verbalize current INR target range and antiplatelet therapy for discharge home Outcome: Progressing   Problem: Cardiac: Goal: LVAD will function as expected and patient will experience no clinical alarms Outcome: Progressing   Problem: Education: Goal: Knowledge of General Education information will improve Description: Including pain rating scale, medication(s)/side effects and non-pharmacologic comfort measures Outcome: Progressing   Problem: Health Behavior/Discharge Planning: Goal: Ability to manage health-related needs will improve Outcome: Progressing   Problem: Clinical Measurements: Goal: Ability to maintain clinical measurements within normal limits will improve Outcome: Progressing Goal: Will remain free from infection Outcome: Progressing Goal: Diagnostic test results will improve Outcome: Progressing Goal: Respiratory complications will improve Outcome: Progressing Goal: Cardiovascular complication will be avoided Outcome: Progressing   Problem: Activity: Goal: Risk for activity intolerance will decrease Outcome: Progressing   Problem: Nutrition: Goal: Adequate nutrition will be maintained Outcome: Progressing   Problem: Coping: Goal: Level of anxiety will decrease Outcome: Progressing   Problem: Elimination: Goal: Will not experience complications related to bowel motility Outcome: Progressing Goal: Will not experience complications related to urinary retention Outcome: Progressing   Problem: Pain Managment: Goal: General experience of comfort will improve and/or be controlled Outcome: Progressing   Problem: Safety: Goal: Ability to remain free from injury will improve Outcome: Progressing   Problem: Skin Integrity: Goal: Risk for impaired skin integrity will decrease Outcome: Progressing   Problem:  Education: Goal: Knowledge of disease or condition will improve Outcome: Progressing Goal: Knowledge of secondary prevention will improve (MUST DOCUMENT ALL) Outcome: Progressing Goal: Knowledge of patient specific risk factors will improve (DELETE if not current risk factor) Outcome: Progressing   Problem: Ischemic Stroke/TIA Tissue Perfusion: Goal: Complications of ischemic stroke/TIA will be minimized Outcome: Progressing   Problem: Coping: Goal: Will verbalize positive feelings about self Outcome: Progressing Goal: Will identify appropriate support needs Outcome: Progressing   Problem: Health Behavior/Discharge Planning: Goal: Ability to manage health-related needs will improve Outcome: Progressing Goal: Goals will be collaboratively established with patient/family Outcome: Progressing   Problem: Self-Care: Goal: Ability to participate in self-care as condition permits will improve Outcome: Progressing Goal: Verbalization of feelings and concerns over difficulty with self-care will improve Outcome: Progressing Goal: Ability to communicate needs accurately will improve Outcome: Progressing   Problem: Nutrition: Goal: Risk of aspiration will decrease Outcome: Progressing Goal: Dietary intake will improve Outcome: Progressing

## 2024-02-08 NOTE — Consult Note (Signed)
 ELECTROPHYSIOLOGY CONSULT NOTE    Patient ID: Antonin Meininger MRN: [id number], DOB/AGE: [birth year] 72 y.o.  Admit date: 02/05/2024 Date of Consult: 02/08/2024  Primary Physician: Lyle San, MD Primary Cardiologist: Peder Bourdon, MD  Electrophysiologist: Dr. Rodolfo Clan   Referring Provider: Dr. Alease Amend  Patient Profile: Quinto Tippy is a 73 y.o. male with a history of VT, NICM, s/p ICD, LVAD, afib, CKD IV, previously requiring HD, CAD s/p PCI who is being seen today for the evaluation of Pacing with device at ERI at the request of Dr. Alease Amend.  HPI:  Yesenia Fontenette is a 73 y.o. male with medical history as above.   He had been followed historically by Dr. Rodolfo Clan with ICD and h/o VT. His device met ERI 06/09/2023, with no plans to replace given no real history of prior pacing (6-8%) and LVAD-in-situ.  Pts device subsequently met EOL and had capacitor reformation multiple times in office by industry in attempt to silence alarms. EOL reverts device to VVI 50, but historically had not paced.   Pt seen in ED 5/14 with concerns for an ICD shock. K and Mg low. ICD interrogation revealed device at EOL, so no episodes available. Ultimately felt stable for discharge with negative work up.   Pt reported being at baseline, having mowed the yard 5/16.  On am of 5/17 wife found him on the ground confused with coffee-ground emesis. In ER found to have focal neuro defecits with R vision field cut and dysmetria. Code stroke with CT positive for acute to subacute left PCA and left cerebellar infarcts with Hyperdense left PCA suggesting occlusion of that vessel.  INR 2.2 on arrival. Neuro opted to treat with IV heparin  and serial scan.  Seen by Neuro. Goal INR increased 2.5-3.0.  Throughout the admission, his HR gradually settled down into the 50s, ultimately resulting in V pacing.  EP team asked to see for consideration of generator change given new apparent PPM dependence, and device  previously known to be at EOL.   Pt feeling OK today. Still with some expressive aphasia. Wife not present during our initial conversation. Pt understands that generator change would be higher than usual for bleeding and infection risk given inability to interrupt anticoagulation and LVAD present.  He states he would be willing to proceed "if he needs it"  Labs Potassium4.3 (05/20 0249) Magnesium   2.0 (05/17 1106) Creatinine, ser  2.30* (05/20 0249) PLT  183 (05/20 0249) HGB  12.5* (05/20 0249) WBC 4.5 (05/20 0249) Troponin I (High Sensitivity)185* (05/17 1428).    Allergies, Medical, Surgical, Social, and Family Histories have been reviewed and are referenced here-in when relevant for medical decision making.    Physical Exam: Vitals:   02/08/24 0400 02/08/24 0500 02/08/24 0600 02/08/24 0700  BP: (!) 85/60 (!) 84/65 (!) 82/65 (!) 87/67  Pulse: (!) 31 (!) 31 (!) 29 (!) 57  Resp: 17 14 15 19   Temp:    97.6 F (36.4 C)  TempSrc:    Axillary  SpO2: 96% 97% 96% 96%  Weight:  85 kg    Height:        GEN- NAD, A&O x 3, normal affect HEENT: Normocephalic, atraumatic Lungs- CTAB, Normal effort.  Heart- Regular rate and rhythm, No M/G/R.  GI- Soft, NT, ND.  Extremities- No clubbing, cyanosis, or edema   EKG:5/17 shows HR ~50 with likely V pacing and background interference from LVAD (personally reviewed)  TELEMETRY: V pacing at 50 bpm (personally reviewed)  DEVICE  HISTORY:  BSX DDD ICD 10/2012 for CHF  Assessment/Plan:  Sinus bradycardia Pacing this admission with device at EOL / VVI 50.  ABLE to reprogram, BUT with the caveat that interrogating and adjusting could exhaust the remaining battery life, and we cannot confirm pts underlying prior to attempting.   NICM S/p LVAD H/o VT Historically had low burden of pacing, and had not planned to replace device.   Acute CVA INR goal now 2.5 - 3.0  CKD IV Baseline Cr 2.5 Follows with nephrology Previously required HD, but  only transiently.   Pending CIR once bed available -> Disposition further pending Gen change discussions.   Dr. Arlester Ladd to see  For questions or updates, please contact [facility] Please consult [email or web address] for contact info under Cardiology/STEMI.  Delorise Few, PA-C  02/08/2024 10:40 AM

## 2024-02-08 NOTE — Progress Notes (Signed)
 Speech Language Pathology Treatment: Cognitive-Linquistic  Patient Details Name: Isaac [name] MRN: [id number] DOB: 1951/04/14 Today's Date: 02/08/2024 Time: 4098-1191 SLP Time Calculation (min) (ACUTE ONLY): 25 min  Assessment / Plan / Recommendation Clinical Impression  Visit pt this am, pt drowsy, no family present, but left aphasia info book. Pt able to name 3 objects (mirror, paint brush, hammer) by moving his head back and forth to examine each. Then identified objects laid on his table on command with moderate cues for visual scanning to right. Then followed 3 two step commands that required a similar pattern of finding his right hand as an anchor, scanning left, locating an object and placing it on the left of another object "put the hammer to the left of the mirror". Pt required max tactile, verbal and visual cueing with repetition to do this but improved on third trial. Will continue efforts.   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 Repeat Ct head 02/06/24 evolving left cerebellar, left PCA and right frontal gyrus MCA branch infarct.  No hemorrhage.  MRI cannot be done when patient has LVAD device. Pt had esophagram on 04/27/23 due to complaint of chest tightness while eating. No penetration or aspiration, small volume reflux.      SLP Plan  Continue with current plan of care      Recommendations for follow up therapy are one component of a multi-disciplinary discharge planning process, led by the attending physician.  Recommendations may be updated based on patient status, additional functional criteria and insurance authorization.    Recommendations                                 Continue with current plan of care     Skyah Hannon, Hardin Leys  02/08/2024, 11:39 AM

## 2024-02-08 NOTE — Consult Note (Signed)
 Physical Medicine and Rehabilitation Consult Reason for Consult:CIR Referring Physician: Dr Alease Amend   HPI: Isaac [name] is a 73 y.o.R handed male  with hx of LVAD, Afib, on Coumadin ; CAD, CHF, CKD4, HLD and HLD admitted to hospital on 5/14 when pacemaker went off abruptly- per wife it's hard ot tease off if pacemaker went off before or after Sx's of confusion R sided weakness and Aphasia/Visual field deficit occurred.   Pt brought to ED and found to have by CT, a L PCA, L cerebellar and R frontal gyrus MCA infarct.  Pt also found to have EF 25-30%, LDL 73 and A1c of 5.3.  He's been seen by therapy- doing mod A to mod A of 2 but max cues to complete tasks due to impaired cognition.   Wife reports LBM Saturday and he appears constipated to her.   Using purewick- urine is darker beer colored.    Lives with wife in 2 story house- 1/2 bath on first floor- but MBR 2nd floor.   Ususally walked with RW- goal of INR 2.5 to 3 now that had stroke.     Review of Systems  Unable to perform ROS: Mental acuity  All other systems reviewed and are negative.   Past Medical History:  Diagnosis Date   Arrhythmia    atrial fibrillation   CHF (congestive heart failure) (HCC)    Chronic kidney disease    Coronary artery disease    Hyperlipidemia    Hypertension    Myocardial infarct [facility])    Past Surgical History:  Procedure Laterality Date   CARDIAC DEFIBRILLATOR PLACEMENT  feb 2014   INSERTION OF IMPLANTABLE LEFT VENTRICULAR ASSIST DEVICE N/A 09/17/2021   Procedure: INSERTION OF IMPLANTABLE LEFT VENTRICULAR ASSIST DEVICE AND INSERTION OF FEMORAL ARTERIAL LINE;  Surgeon: Bartley Lightning, MD;  Location: [facility] OR;  Service: Open Heart Surgery;  Laterality: N/A;   IR FLUORO GUIDE CV LINE RIGHT  09/27/2021   IR THORACENTESIS ASP PLEURAL SPACE W/IMG GUIDE  09/27/2021   IR US  GUIDE VASC ACCESS RIGHT  09/27/2021   PLACEMENT OF IMPELLA LEFT VENTRICULAR ASSIST DEVICE N/A 09/12/2021    Procedure: PLACEMENT OF IMPELLA 5.5 LEFT VENTRICULAR ASSIST DEVICE;  Surgeon: Zelphia Higashi, MD;  Location: [facility] OR;  Service: Open Heart Surgery;  Laterality: N/A;   RIGHT HEART CATH N/A 09/08/2021   Procedure: RIGHT HEART CATH;  Surgeon: Darlis Eisenmenger, MD;  Location: [facility] INVASIVE CV LAB;  Service: Cardiovascular;  Laterality: N/A;   TEE WITHOUT CARDIOVERSION N/A 09/12/2021   Procedure: TRANSESOPHAGEAL ECHOCARDIOGRAM (TEE);  Surgeon: Zelphia Higashi, MD;  Location: [facility] OR;  Service: Open Heart Surgery;  Laterality: N/A;   TEE WITHOUT CARDIOVERSION N/A 09/17/2021   Procedure: TRANSESOPHAGEAL ECHOCARDIOGRAM (TEE);  Surgeon: Bartley Lightning, MD;  Location: [facility] OR;  Service: Open Heart Surgery;  Laterality: N/A;   Family History  Problem Relation Age of Onset   Hypertension Mother    Hypertension Father    Hyperlipidemia Father    Heart attack Father    Heart failure Father    Social History:  reports that he has quit smoking. He has never used smokeless tobacco. He reports that he does not drink alcohol and does not use drugs. Allergies:  Allergies  Allergen Reactions   Mushroom Extract Complex (Obsolete) Nausea And Vomiting   Doxycycline  Nausea And Vomiting   Neosporin [Neomycin-Bacitracin Zn-Polymyx] Hives   Tape Other (See Comments)    Some tapes/dressings can irritate  the skin   Medications Prior to Admission  Medication Sig Dispense Refill   cefadroxil  (DURICEF) 500 MG capsule Take 500 mg by mouth 2 (two) times daily.     furosemide  (LASIX ) 20 MG tablet Take 2 tablets (40 mg total) by mouth daily. 270 tablet 1   magnesium  oxide (MAG-OX) 400 MG tablet Take 1 tablet (400 mg total) by mouth daily. 90 tablet 3   Multiple Vitamins-Minerals (EMERGEN-C IMMUNE PO) Take 1 Dose by mouth daily as needed (Supplement).     pantoprazole  (PROTONIX ) 40 MG tablet TAKE 1 TABLET BY MOUTH EVERY DAY 90 tablet 1   rosuvastatin  (CRESTOR ) 10 MG tablet TAKE 1 TABLET BY MOUTH EVERY DAY 90  tablet 3   sildenafil  (REVATIO ) 20 MG tablet Take 1 tablet (20 mg total) by mouth 3 (three) times daily. 90 tablet 11   warfarin (COUMADIN ) 2 MG tablet Instructed to take 4 mg (2 tabs) every Monday and 2 mg (1 tab) all other days or as directed by HF Clinic 50 tablet 11   potassium chloride  SA (KLOR-CON  M) 20 MEQ tablet Take 1 tablet (20 mEq total) by mouth daily. (Patient not taking: Reported on 02/06/2024) 90 tablet 3    Home: Home Living Family/patient expects to be discharged to:: Private residence Living Arrangements: Spouse/significant other Available Help at Discharge: Family Type of Home: House Home Access: Stairs to enter Secretary/administrator of Steps: 1 Home Layout: Two level, Bed/bath upstairs Alternate Level Stairs-Number of Steps: flight Alternate Level Stairs-Rails: Right Bathroom Shower/Tub: Health visitor: Pharmacist, community: Yes Home Equipment: Rollator (4 wheels)  Lives With: Spouse  Functional History: Prior Function Prior Level of Function : Independent/Modified Independent Mobility Comments: no AD use ADLs Comments: ind Functional Status:  Mobility: Bed Mobility Overal bed mobility: Needs Assistance Bed Mobility: Supine to Sit Supine to sit: Min assist General bed mobility comments: min physical assist, mod cueing to scoot to EOB Transfers Overall transfer level: Needs assistance Equipment used: 2 person hand held assist Transfers: Sit to/from Stand, Bed to chair/wheelchair/BSC Sit to Stand: Min assist, Mod assist, +2 physical assistance Bed to/from chair/wheelchair/BSC transfer type:: Step pivot Step pivot transfers: Mod assist, +2 physical assistance General transfer comment: max directional verbal cues to power up Ambulation/Gait Ambulation/Gait assistance: Mod assist, +2 safety/equipment Gait Distance (Feet): 50 Feet Assistive device: 2 person hand held assist Gait Pattern/deviations: Step-to pattern, Decreased  stride length, Decreased dorsiflexion - right, Decreased weight shift to right, Antalgic General Gait Details: pt with wide base of support and stating "I can't move" upon initial attempt to ambulate. max directional verbal cues to advance R LE, assist to weight shift to the R to advance L LE, pt easily distracted and looking to L constantly Gait velocity: dec Gait velocity interpretation: <1.31 ft/sec, indicative of household ambulator    ADL: ADL Overall ADL's : Needs assistance/impaired Eating/Feeding: Minimal assistance Grooming: Minimal assistance, Sitting Upper Body Bathing: Moderate assistance, Sitting, Standing Lower Body Bathing: Moderate assistance, Sitting/lateral leans, Sit to/from stand Upper Body Dressing : Moderate assistance, Sitting, Standing Lower Body Dressing: Moderate assistance, Sitting/lateral leans, Sit to/from stand Toilet Transfer: Minimal assistance, +2 for physical assistance, Moderate assistance Toileting- Clothing Manipulation and Hygiene: Maximal assistance Functional mobility during ADLs: Minimal assistance, +2 for physical assistance  Cognition: Cognition Overall Cognitive Status: Impaired/Different from baseline Arousal/Alertness: Lethargic Orientation Level: Oriented to person, Oriented to place, Disoriented to time, Disoriented to situation Attention: Focused, Sustained, Selective Focused Attention: Appears intact Sustained Attention: Impaired Sustained Attention Impairment:  Verbal basic, Functional basic Selective Attention: Impaired Selective Attention Impairment: Verbal basic, Functional basic Memory:  (NT) Awareness: Impaired Awareness Impairment: Intellectual impairment, Emergent impairment, Anticipatory impairment Problem Solving: Impaired Problem Solving Impairment: Functional basic, Verbal basic Cognition Arousal: Alert Behavior During Therapy: WFL for tasks assessed/performed Overall Cognitive Status: Impaired/Different from  baseline  Blood pressure (!) 85/70, pulse (!) 157, temperature (!) 97.5 F (36.4 C), temperature source Axillary, resp. rate 19, height 5\' 9"  (1.753 m), weight 85 kg, SpO2 100%. Physical Exam Vitals and nursing note reviewed. Exam conducted with a chaperone present.  Constitutional:      Appearance: Normal appearance. He is normal weight.  HENT:     Head: Normocephalic and atraumatic.     Comments:   R mild facial droop Facial sensation intact B/L    Right Ear: External ear normal.     Left Ear: External ear normal.     Nose: Nose normal. No congestion.     Mouth/Throat:     Mouth: Mucous membranes are dry.     Pharynx: Oropharynx is clear.  Eyes:     General:        Right eye: No discharge.        Left eye: No discharge.     Comments:  Cannot look R- past midline - R visual field cut- hemianopia No nystagmus seen  Cardiovascular:     Comments: LVAD hum  Pulmonary:     Effort: Pulmonary effort is normal. No respiratory distress.     Breath sounds: Normal breath sounds. No wheezing, rhonchi or rales.     Comments: Slightly decreased at bases Abdominal:     General: There is distension.     Palpations: Abdomen is soft.     Tenderness: There is no abdominal tenderness.     Comments: Hard to tell if just protuberant or distended (+ hypoactive BS , NT  Genitourinary:    Comments: Purewick- in place Urine darker than coors beer Musculoskeletal:        General: Normal range of motion.     Cervical back: Neck supple. No tenderness.     Comments: RUE- 4/5 proximally and distally LUE- 5/5 except grip and FA were 4+/5 RLE- HF 2/5; at best; KE/KF 4-/5; DF 4/5; PF 4/5  Skin:    General: Skin is warm and dry.     Comments: LVAD in place L AC fossa IV- looks good  Neurological:     Comments: Delayed responses; and delayed thinking Ox1-2- knew [facility], was able to pick hospital and tell me at [facility]; knew was May, but thought was 32 "something" or  "17" Some word finiding issues/aphasia, but naming was intact 3/3 with no cues, however memory 0/3 even with cues Repetition was intact No increased DTR's; no increased tone and no clonus or Hoffman's Mild perseveration and R neglect Didn't even know wife birthday was today even though told by wife 2-3 times today.    Psychiatric:     Comments: Extremely flat     Results for orders placed or performed during the hospital encounter of 02/05/24 (from the past 24 hours)  Lactate dehydrogenase     Status: None   Collection Time: 02/08/24  2:49 AM  Result Value Ref Range   LDH 154 98 - 192 U/L  Protime-INR     Status: Abnormal   Collection Time: 02/08/24  2:49 AM  Result Value Ref Range   Prothrombin Time 28.8 (H) 11.4 - 15.2  seconds   INR 2.7 (H) 0.8 - 1.2  CBC     Status: Abnormal   Collection Time: 02/08/24  2:49 AM  Result Value Ref Range   WBC 4.5 4.0 - 10.5 K/uL   RBC 4.26 4.22 - 5.81 MIL/uL   Hemoglobin 12.5 (L) 13.0 - 17.0 g/dL   HCT 04.5 (L) 40.9 - 81.1 %   MCV 91.3 80.0 - 100.0 fL   MCH 29.3 26.0 - 34.0 pg   MCHC 32.1 30.0 - 36.0 g/dL   RDW 91.4 78.2 - 95.6 %   Platelets 183 150 - 400 K/uL   nRBC 0.0 0.0 - 0.2 %  Basic metabolic panel     Status: Abnormal   Collection Time: 02/08/24  2:49 AM  Result Value Ref Range   Sodium 136 135 - 145 mmol/L   Potassium 4.3 3.5 - 5.1 mmol/L   Chloride 108 98 - 111 mmol/L   CO2 18 (L) 22 - 32 mmol/L   Glucose, Bld 92 70 - 99 mg/dL   BUN 30 (H) 8 - 23 mg/dL   Creatinine, Ser 2.13 (H) 0.61 - 1.24 mg/dL   Calcium  8.6 (L) 8.9 - 10.3 mg/dL   GFR, Estimated 30 (L) >60 mL/min   Anion gap 10 5 - 15   No results found.   Assessment/Plan: Diagnosis: L PCA, L cerebellar, and R MCA strokes Does the need for close, 24 hr/day medical supervision in concert with the patient's rehab needs make it unreasonable for this patient to be served in a less intensive setting? Yes Co-Morbidities requiring supervision/potential complications: LVAD,  aphasia, Afib, on Coumadin ; HLD, CAD CKD4, CHF, HLD,  R hemianopia  Due to bladder management, bowel management, safety, skin/wound care, disease management, medication administration, and patient education, does the patient require 24 hr/day rehab nursing? Yes Does the patient require coordinated care of a physician, rehab nurse, therapy disciplines of PT, OT and SLP to address physical and functional deficits in the context of the above medical diagnosis(es)? Yes Addressing deficits in the following areas: balance, endurance, locomotion, strength, transferring, bowel/bladder control, bathing, dressing, feeding, grooming, toileting, cognition, speech, and language Can the patient actively participate in an intensive therapy program of at least 3 hrs of therapy per day at least 5 days per week? Yes The potential for patient to make measurable gains while on inpatient rehab is good Anticipated functional outcomes upon discharge from inpatient rehab are supervision and min assist  with PT, supervision and min assist with OT, min assist with SLP. Estimated rehab length of stay to reach the above functional goals is: 2.5 to 3 weeks Anticipated discharge destination: Home Overall Rehab/Functional Prognosis: good  RECOMMENDATIONS: This patient's condition is appropriate for continued rehabilitative care in the following setting: CIR since has LVAD and  (getting new AICD), cannot go to SNF Patient has agreed to participate in recommended program. Yes Note that insurance prior authorization may be required for reimbursement for recommended care.  Comment:  1. Discussed with pt drink a little more, staying within his fluid restriction, but urine was darker than coors beer, needs to be more like lemonade.   2. Pt is a perfect candidate for inpt rehab- due to being a stroke- he will likely need help with LVAD after he discharges due to memory issues 3. Will d/w admissions coordinator about admission 4. Goal  to do AICD in the next 1-2 days, so would be ready after it's done.  5. Thank you for this consult 6.  Answered all wife' questions about CIR, dispo- need for rehab, what it entails and about AICD 7. Needs to have BM LBM Saturday before admission- please give increased laxatives , not just Colace.   I spent a total of  81   minutes on total care today- >50% coordination of care- due to  Review of complex chart, exam and interview of patient, nursing and wife; d/w  admissions coordinator about AICD as well as documentation  Celia Coles, MD 02/08/2024

## 2024-02-08 NOTE — Progress Notes (Signed)
 Physical Therapy Treatment Patient Details Name: Isaac [name] MRN: [id number] DOB: [birth year] Today's Date: 02/08/2024   History of Present Illness Pt is a 73 y/o M presenting to ED with concernsf or his defibrillator activating, per spouse was confused wtih R weakness, wordfinding difficulties an R visual field deficit. CTA with L PCA and L cerebellar infarct. PMH includes HTN, HLD, CAD, CKD IV, CHF, HM III LVAD    PT Comments  Pt more alert this date. Pt continues to demo R sided inattention, difficulty sequencing resulting in requiring directional cues and assist for transitioning LVAD power source, delayed response time, impaired sensation and proprioception on R side and requiring assistx2 for transfers and ambulation. Continue to recommend inpatient rehab program > 3 hrs a day to address above deficits. Pt's wife present and very supportive. Wife appreciative of education on importance of sitting on R side of patient to help him learn to look to the R side. Acute PT to cont to follow.    If plan is discharge home, recommend the following: A little help with walking and/or transfers;A lot of help with bathing/dressing/bathroom;Assist for transportation;Help with stairs or ramp for entrance;Direct supervision/assist for medications management   Can travel by private vehicle        Equipment Recommendations   (TBD at next venue)    Recommendations for Other Services Rehab consult     Precautions / Restrictions Precautions Precautions: Fall Precaution/Restrictions Comments: LVAD, R neglect Restrictions Weight Bearing Restrictions Per Provider Order: No     Mobility  Bed Mobility Overal bed mobility: Needs Assistance Bed Mobility: Supine to Sit     Supine to sit: Min assist     General bed mobility comments: min physical assist, mod cueing to scoot to EOB    Transfers Overall transfer level: Needs assistance Equipment used: 2 person hand held assist Transfers: Sit  to/from Stand, Bed to chair/wheelchair/BSC Sit to Stand: Min assist, Mod assist, +2 physical assistance           General transfer comment: max directional verbal cues to power up    Ambulation/Gait Ambulation/Gait assistance: Mod assist, +2 safety/equipment Gait Distance (Feet): 50 Feet Assistive device: 2 person hand held assist Gait Pattern/deviations: Step-to pattern, Decreased stride length, Decreased dorsiflexion - right, Decreased weight shift to right, Antalgic Gait velocity: dec Gait velocity interpretation: <1.31 ft/sec, indicative of household ambulator   General Gait Details: pt with wide base of support and stating "I can't move" upon initial attempt to ambulate. max directional verbal cues to advance R LE, assist to weight shift to the R to advance L LE, pt easily distracted and looking to L constantly   Stairs             Wheelchair Mobility     Tilt Bed    Modified Rankin (Stroke Patients Only)       Balance Overall balance assessment: Needs assistance Sitting-balance support: Feet supported Sitting balance-Leahy Scale: Fair     Standing balance support: Reliant on assistive device for balance, During functional activity Standing balance-Leahy Scale: Poor Standing balance comment: reliant on external support                            Communication Communication Communication: No apparent difficulties  Cognition Arousal: Alert Behavior During Therapy: WFL for tasks assessed/performed   PT - Cognitive impairments: Sequencing, Problem solving  PT - Cognition Comments: R sided inattention, word finding difficulty, impaired R sided proprioception. impaired squencing requiring verbal cues to complete transfer of power source from wall to batteries requiring significant increase in time Following commands: Impaired Following commands impaired: Follows one step commands inconsistently    Cueing Cueing  Techniques: Verbal cues  Exercises      General Comments General comments (skin integrity, edema, etc.): VSS, HR in 50s, BP 80s/60s      Pertinent Vitals/Pain Pain Assessment Pain Assessment: No/denies pain    Home Living                          Prior Function            PT Goals (current goals can now be found in the care plan section) Acute Rehab PT Goals Patient Stated Goal: didn't state PT Goal Formulation: With patient/family Time For Goal Achievement: 02/21/24 Potential to Achieve Goals: Good Progress towards PT goals: Progressing toward goals    Frequency    Min 2X/week      PT Plan      Co-evaluation              AM-PAC PT "6 Clicks" Mobility   Outcome Measure  Help needed turning from your back to your side while in a flat bed without using bedrails?: A Little Help needed moving from lying on your back to sitting on the side of a flat bed without using bedrails?: A Little Help needed moving to and from a bed to a chair (including a wheelchair)?: A Lot Help needed standing up from a chair using your arms (e.g., wheelchair or bedside chair)?: A Lot Help needed to walk in hospital room?: A Lot Help needed climbing 3-5 steps with a railing? : A Lot 6 Click Score: 14    End of Session Equipment Utilized During Treatment: Gait belt;Oxygen Activity Tolerance: Patient limited by fatigue Patient left: in chair;with call bell/phone within reach;with chair alarm set;with nursing/sitter in room;with family/visitor present Nurse Communication: Mobility status PT Visit Diagnosis: Unsteadiness on feet (R26.81);Hemiplegia and hemiparesis Hemiplegia - Right/Left: Right Hemiplegia - dominant/non-dominant: Dominant Hemiplegia - caused by: Cerebral infarction     Time: 1137-1215 PT Time Calculation (min) (ACUTE ONLY): 38 min  Charges:    $Gait Training: 8-22 mins $Therapeutic Activity: 8-22 mins $Neuromuscular Re-education: 8-22 mins PT  General Charges $$ ACUTE PT VISIT: 1 Visit                     Renaee Caro, PT, DPT Acute Rehabilitation Services Secure chat preferred Office #: [phone]    Jenna Moan 02/08/2024, 2:58 PM

## 2024-02-08 NOTE — Progress Notes (Addendum)
 Inpatient Rehab Admissions Coordinator:     I met with pt. To discuss potential admit to CIR. I spoke with wife over the phone. They are interested and wife states she plans to stop working to assist Pt. At d/c. Reached out to HF team to discuss plan for ICD that is EOL.   Wandalee Gust, MS, CCC-SLP Rehab Admissions Coordinator  [phone] (celll) [phone] (office)

## 2024-02-08 NOTE — Plan of Care (Signed)
  Problem: Education: Goal: Patient will understand all VAD equipment and how it functions Outcome: Progressing Goal: Patient will be able to verbalize current INR target range and antiplatelet therapy for discharge home Outcome: Progressing   Problem: Cardiac: Goal: LVAD will function as expected and patient will experience no clinical alarms Outcome: Progressing   Problem: Education: Goal: Knowledge of General Education information will improve Description: Including pain rating scale, medication(s)/side effects and non-pharmacologic comfort measures Outcome: Progressing   Problem: Health Behavior/Discharge Planning: Goal: Ability to manage health-related needs will improve Outcome: Progressing   Problem: Clinical Measurements: Goal: Ability to maintain clinical measurements within normal limits will improve Outcome: Progressing Goal: Will remain free from infection Outcome: Progressing Goal: Diagnostic test results will improve Outcome: Progressing Goal: Respiratory complications will improve Outcome: Progressing Goal: Cardiovascular complication will be avoided Outcome: Progressing   Problem: Activity: Goal: Risk for activity intolerance will decrease Outcome: Progressing   Problem: Nutrition: Goal: Adequate nutrition will be maintained Outcome: Progressing   Problem: Coping: Goal: Level of anxiety will decrease Outcome: Progressing   Problem: Elimination: Goal: Will not experience complications related to bowel motility Outcome: Progressing Goal: Will not experience complications related to urinary retention Outcome: Progressing   Problem: Pain Managment: Goal: General experience of comfort will improve and/or be controlled Outcome: Progressing   Problem: Safety: Goal: Ability to remain free from injury will improve Outcome: Progressing   Problem: Skin Integrity: Goal: Risk for impaired skin integrity will decrease Outcome: Progressing   Problem:  Education: Goal: Knowledge of disease or condition will improve Outcome: Progressing Goal: Knowledge of secondary prevention will improve (MUST DOCUMENT ALL) Outcome: Progressing Goal: Knowledge of patient specific risk factors will improve (DELETE if not current risk factor) Outcome: Progressing   Problem: Ischemic Stroke/TIA Tissue Perfusion: Goal: Complications of ischemic stroke/TIA will be minimized Outcome: Progressing   Problem: Coping: Goal: Will verbalize positive feelings about self Outcome: Progressing Goal: Will identify appropriate support needs Outcome: Progressing   Problem: Health Behavior/Discharge Planning: Goal: Ability to manage health-related needs will improve Outcome: Progressing Goal: Goals will be collaboratively established with patient/family Outcome: Progressing   Problem: Nutrition: Goal: Risk of aspiration will decrease Outcome: Progressing Goal: Dietary intake will improve Outcome: Progressing

## 2024-02-08 NOTE — Progress Notes (Signed)
 LVAD Coordinator Rounding Note:  Pt admitted 02/05/24 for altered mental status. Code Stroke activated. CT showed Left PCA and left cerebellar infarct. Pt not candidate for thrombolytic therapy or IR due to the occlusion being too distal.  STAT CT head 1. 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 Hyperdense Left PCA suggests occlusion of that vessel. No hemorrhagic transformation or mass effect. 2. Underlying advanced chronic small vessel disease. 3. Right supraorbital/forehead recent soft tissue injury with no skull fracture identified.  Pt sitting in up in bed on my arrival. His wife reports he just finished his evaluation with speech therapy and is able to resume a regular diet with assistance. Pt continues to have right sided deficits and aphasia. Pt recognized this VAD Coordinator and attempted to have a conversation but was very tearful. Emotional support provided to patient and his wife [name].  Goal INR now 2.5-3.0 per Neurology.  PT recommending CIR.  Vital signs: Temp: 97.5 HR: 50 V paced Doppler Pressure: 66 Auto Cuff BP: 90/54 (66) O2 Sat: 96% on RA Wt: 184>183.2>182.9>187.3 lbs    LVAD interrogation reveals:  Speed: 5300 Flow: 4.5 Power: 3.9w PI: 3.1 Hematocrit: 38  Alarms: none Events: 5 PI today  Fixed speed: 5300 Low speed limit: 5000  Drive Line:  Existing VAD dressing C/D/I with anchor intact and accurately applied. Weekly dressing changes per VAD coordinator, or bedside nurse. Next dressing change due: 02/12/24   Labs:  LDH trend: 161>175>154  INR trend: 2.2>2.6>2.3>2.7  Anticoagulation Plan: -INR Goal: 2.5-3.0  Device: - [employer] dual - AV paced 60 -Therapies: ERI  Patient seen in [facility] last week for suspected ICD shock.Unable to interrogate as ICD is ERI. Pt originally elected to not move forward with a generator change; however, is currently pacer dependent. AHF team consulted EP to consult for  generator change this admission.  Infection:   Plan/Recommendations:  Page VAD coordinator for drive line or equipment concerns 2. Weekly dressing changes.  Adams Adams RN,BSN VAD Coordinator  Office: [phone]  24/7 Pager: [phone]

## 2024-02-09 ENCOUNTER — Encounter (HOSPITAL_COMMUNITY)
Admission: EM | Disposition: A | Discharge status: Skilled Nursing Facility | Payer: Self-pay | Source: Home / Self Care | Attending: Cardiology

## 2024-02-09 DIAGNOSIS — I5022 Chronic systolic (congestive) heart failure: Secondary | ICD-10-CM | POA: Diagnosis not present

## 2024-02-09 DIAGNOSIS — Z4502 Encounter for adjustment and management of automatic implantable cardiac defibrillator: Secondary | ICD-10-CM

## 2024-02-09 DIAGNOSIS — Z95811 Presence of heart assist device: Secondary | ICD-10-CM | POA: Diagnosis not present

## 2024-02-09 DIAGNOSIS — I639 Cerebral infarction, unspecified: Secondary | ICD-10-CM | POA: Diagnosis not present

## 2024-02-09 LAB — CBC
HCT: 39.9 % (ref 39.0–52.0)
Hemoglobin: 13 g/dL (ref 13.0–17.0)
MCH: 29.9 pg (ref 26.0–34.0)
MCHC: 32.6 g/dL (ref 30.0–36.0)
MCV: 91.7 fL (ref 80.0–100.0)
Platelets: 178 10*3/uL (ref 150–400)
RBC: 4.35 MIL/uL (ref 4.22–5.81)
RDW: 15.3 % (ref 11.5–15.5)
WBC: 3.8 10*3/uL — ABNORMAL LOW (ref 4.0–10.5)
nRBC: 0 % (ref 0.0–0.2)

## 2024-02-09 LAB — BASIC METABOLIC PANEL WITH GFR
Anion gap: 10 (ref 5–15)
BUN: 29 mg/dL — ABNORMAL HIGH (ref 8–23)
CO2: 20 mmol/L — ABNORMAL LOW (ref 22–32)
Calcium: 8.9 mg/dL (ref 8.9–10.3)
Chloride: 107 mmol/L (ref 98–111)
Creatinine, Ser: 2.17 mg/dL — ABNORMAL HIGH (ref 0.61–1.24)
GFR, Estimated: 32 mL/min — ABNORMAL LOW (ref >60)
Glucose, Bld: 100 mg/dL — ABNORMAL HIGH (ref 70–99)
Potassium: 4.4 mmol/L (ref 3.5–5.1)
Sodium: 137 mmol/L (ref 135–145)

## 2024-02-09 LAB — SURGICAL PCR SCREEN
MRSA, PCR: NEGATIVE
Staphylococcus aureus: NEGATIVE

## 2024-02-09 LAB — PROTIME-INR
INR: 2.5 — ABNORMAL HIGH (ref 0.8–1.2)
Prothrombin Time: 27.4 s — ABNORMAL HIGH (ref 11.4–15.2)

## 2024-02-09 LAB — LACTATE DEHYDROGENASE: LDH: 169 U/L (ref 98–192)

## 2024-02-09 SURGERY — ICD GENERATOR CHANGEOUT

## 2024-02-09 MED ORDER — CHLORHEXIDINE GLUCONATE 4 % EX SOLN
60.0000 mL | Freq: Once | CUTANEOUS | Status: AC
  Administered 2024-02-09: 4 via TOPICAL

## 2024-02-09 MED ORDER — LIDOCAINE HCL (PF) 1 % IJ SOLN
INTRAMUSCULAR | Status: AC
  Filled 2024-02-09: qty 60

## 2024-02-09 MED ORDER — WARFARIN SODIUM 1 MG PO TABS
1.0000 mg | ORAL_TABLET | Freq: Once | ORAL | Status: AC
  Administered 2024-02-09: 1 mg via ORAL
  Filled 2024-02-09: qty 1

## 2024-02-09 MED ORDER — POLYETHYLENE GLYCOL 3350 17 G PO PACK
17.0000 g | PACK | Freq: Every day | ORAL | Status: DC
Start: 2024-02-09 — End: 2024-02-10
  Administered 2024-02-09 – 2024-02-10 (×2): 17 g via ORAL
  Filled 2024-02-09 (×2): qty 1

## 2024-02-09 MED ORDER — CEFAZOLIN SODIUM-DEXTROSE 1-4 GM/50ML-% IV SOLN
1.0000 g | Freq: Once | INTRAVENOUS | Status: AC
  Administered 2024-02-09: 1 g via INTRAVENOUS
  Filled 2024-02-09: qty 50

## 2024-02-09 MED ORDER — BISACODYL 5 MG PO TBEC: 5.0000 mg | DELAYED_RELEASE_TABLET | Freq: Once | ORAL | Status: DC

## 2024-02-09 MED ORDER — SODIUM CHLORIDE 0.9 % IV SOLN: INTRAVENOUS | Status: DC

## 2024-02-09 MED ORDER — CHLORHEXIDINE GLUCONATE 4 % EX SOLN
60.0000 mL | Freq: Once | CUTANEOUS | Status: AC
  Administered 2024-02-09: 4 via TOPICAL
  Filled 2024-02-09: qty 60

## 2024-02-09 MED ORDER — SODIUM CHLORIDE 0.9 % IV SOLN
INTRAVENOUS | Status: AC
  Filled 2024-02-09: qty 2

## 2024-02-09 MED ORDER — LIDOCAINE HCL (PF) 1 % IJ SOLN
INTRAMUSCULAR | Status: DC | PRN
  Administered 2024-02-09: 60 mL

## 2024-02-09 MED ORDER — CEFAZOLIN SODIUM-DEXTROSE 2-4 GM/100ML-% IV SOLN
2.0000 g | INTRAVENOUS | Status: AC
  Administered 2024-02-09: 2 g via INTRAVENOUS
  Filled 2024-02-09: qty 100

## 2024-02-09 MED ORDER — SENNOSIDES-DOCUSATE SODIUM 8.6-50 MG PO TABS
2.0000 | ORAL_TABLET | Freq: Two times a day (BID) | ORAL | Status: DC
Start: 2024-02-09 — End: 2024-02-10
  Administered 2024-02-09 – 2024-02-10 (×2): 2 via ORAL
  Filled 2024-02-09 (×2): qty 2

## 2024-02-09 MED ORDER — SODIUM CHLORIDE 0.9 % IV SOLN
80.0000 mg | INTRAVENOUS | Status: AC
  Administered 2024-02-09: 80 mg
  Filled 2024-02-09: qty 2

## 2024-02-09 MED ORDER — HYDROCODONE-ACETAMINOPHEN 5-325 MG PO TABS
1.0000 | ORAL_TABLET | Freq: Four times a day (QID) | ORAL | Status: DC | PRN
  Administered 2024-02-09: 1 via ORAL
  Filled 2024-02-09: qty 1

## 2024-02-09 SURGICAL SUPPLY — 6 items
CABLE SURGICAL S-101-97-12 (CABLE) ×1 IMPLANT
ICD VIGILANT DR D233 (Pacemaker) IMPLANT
PAD DEFIB RADIO PHYSIO CONN (PAD) ×1 IMPLANT
POUCH AIGIS-R ANTIBACT ICD (Mesh General) ×1 IMPLANT
POUCH AIGIS-R ANTIBACT ICD LRG (Mesh General) IMPLANT
TRAY PACEMAKER INSERTION (PACKS) ×1 IMPLANT

## 2024-02-09 NOTE — Progress Notes (Signed)
 Physical Therapy Treatment Patient Details Name: Isaac [name] MRN: [id number] DOB: 10-26-1950 Today's Date: 02/09/2024   History of Present Illness Pt is a 73 y/o M presenting to ED with concernsf or his defibrillator activating, per spouse was confused wtih R weakness, wordfinding difficulties an R visual field deficit. CTA with L PCA and L cerebellar infarct. PMH includes HTN, HLD, CAD, CKD IV, CHF, HM III LVAD    PT Comments  PT session focused on R LE stepping sequencing during ambulation. Pt currently requires tactile cues at R hip flexors and modA to advance and clear R LE during amb. Pt beginning to tend to R side more without cueing however continues to demo R sided neglect, R sided weakness, impaired balance, impaired ability to sequence and impaired memory. Pt motivated and has great support system. Continue to recommend inpatient rehab program > 3 hrs a day to address above deficits and maximize functional return. Acute PT to cont to follow.     If plan is discharge home, recommend the following: A little help with walking and/or transfers;A lot of help with bathing/dressing/bathroom;Assist for transportation;Help with stairs or ramp for entrance;Direct supervision/assist for medications management   Can travel by private vehicle        Equipment Recommendations   (TBD at next venue)    Recommendations for Other Services Rehab consult     Precautions / Restrictions Precautions Precautions: Fall Precaution/Restrictions Comments: LVAD, R neglect Restrictions Weight Bearing Restrictions Per Provider Order: No     Mobility  Bed Mobility               General bed mobility comments: pt received sitting up in chair    Transfers Overall transfer level: Needs assistance Equipment used: Rolling walker (2 wheels) Transfers: Sit to/from Stand, Bed to chair/wheelchair/BSC Sit to Stand: Min assist           General transfer comment: max directional verbal cues to  power up, tactile cues for R hand placement    Ambulation/Gait Ambulation/Gait assistance: Mod assist, +2 safety/equipment Gait Distance (Feet): 100 Feet Assistive device: Rolling walker (2 wheels) Gait Pattern/deviations: Step-to pattern, Decreased stride length, Decreased dorsiflexion - right, Decreased weight shift to right, Antalgic, Narrow base of support Gait velocity: dec Gait velocity interpretation: <1.31 ft/sec, indicative of household ambulator   General Gait Details: pt vearing to the L, modA for walker management, modA for R LE advancement. initially PT provided maxA to advance R LE. pt then able to clear R foot with tactile cues at R hip flexor. pt unable to clear R foot without tactile cues or physical assist   Stairs             Wheelchair Mobility     Tilt Bed    Modified Rankin (Stroke Patients Only)       Balance Overall balance assessment: Needs assistance Sitting-balance support: Feet supported Sitting balance-Leahy Scale: Fair     Standing balance support: Reliant on assistive device for balance, During functional activity Standing balance-Leahy Scale: Poor Standing balance comment: reliant on external support                            Communication Communication Communication: No apparent difficulties  Cognition Arousal: Alert Behavior During Therapy: WFL for tasks assessed/performed   PT - Cognitive impairments: Sequencing, Problem solving  PT - Cognition Comments: R sided inattention, word finding difficulty, impaired R sided proprioception. impaired squencing requiring verbal cues to complete transfer of power source from wall to batteries requiring significant increase in time Following commands: Impaired Following commands impaired: Follows one step commands inconsistently    Cueing Cueing Techniques: Verbal cues  Exercises      General Comments General comments (skin integrity, edema,  etc.): VSS      Pertinent Vitals/Pain      Home Living                          Prior Function            PT Goals (current goals can now be found in the care plan section) Acute Rehab PT Goals Patient Stated Goal: didn't state PT Goal Formulation: With patient/family Time For Goal Achievement: 02/21/24 Potential to Achieve Goals: Good Progress towards PT goals: Progressing toward goals    Frequency    Min 2X/week      PT Plan      Co-evaluation              AM-PAC PT "6 Clicks" Mobility   Outcome Measure  Help needed turning from your back to your side while in a flat bed without using bedrails?: A Little Help needed moving from lying on your back to sitting on the side of a flat bed without using bedrails?: A Little Help needed moving to and from a bed to a chair (including a wheelchair)?: A Lot Help needed standing up from a chair using your arms (e.g., wheelchair or bedside chair)?: A Lot Help needed to walk in hospital room?: A Lot Help needed climbing 3-5 steps with a railing? : A Lot 6 Click Score: 14    End of Session Equipment Utilized During Treatment: Gait belt Activity Tolerance: Patient limited by fatigue Patient left: in chair;with call bell/phone within reach;with chair alarm set;with family/visitor present Nurse Communication: Mobility status PT Visit Diagnosis: Unsteadiness on feet (R26.81);Hemiplegia and hemiparesis Hemiplegia - Right/Left: Right Hemiplegia - dominant/non-dominant: Dominant Hemiplegia - caused by: Cerebral infarction     Time: 1202-1234 PT Time Calculation (min) (ACUTE ONLY): 32 min  Charges:    $Gait Training: 8-22 mins $Neuromuscular Re-education: 8-22 mins PT General Charges $$ ACUTE PT VISIT: 1 Visit                     Renaee Caro, PT, DPT Acute Rehabilitation Services Secure chat preferred Office #: [phone]    Jenna Moan 02/09/2024, 1:38 PM

## 2024-02-09 NOTE — Progress Notes (Signed)
 Patient wife, Isaac [name] concerned that patient bedtime medicines affect him adversely.  States every night after pm medication, patient not himself.  Would like to discuss with team.    Cardiology paged for pain medication at pacemaker site.  Waiting return call.

## 2024-02-09 NOTE — Progress Notes (Addendum)
 Patient Name: Isaac [name] Date of Encounter: 02/09/2024  Primary Cardiologist: Peder Bourdon, MD Electrophysiologist: Richardo Chandler, MD  Interval Summary   The patient is doing well today.  At this time, the patient denies chest pain, shortness of breath, or any new concerns.  Vital Signs    Vitals:   02/09/24 0312 02/09/24 0500 02/09/24 0744 02/09/24 0800  BP: 93/68  (!) 85/68 (!) 84/66  Pulse: (!) 35 62 (!) 50 (!) 29  Resp: 15 13  18   Temp: 98.3 F (36.8 C)  97.7 F (36.5 C)   TempSrc: Oral  Oral   SpO2: 97% 99%  96%  Weight:  80.9 kg    Height:        Intake/Output Summary (Last 24 hours) at 02/09/2024 0901 Last data filed at 02/09/2024 0748 Gross per 24 hour  Intake 480 ml  Output 400 ml  Net 80 ml   Filed Weights   02/07/24 0500 02/08/24 0500 02/09/24 0500  Weight: 83 kg 85 kg 80.9 kg    Physical Exam    GEN- The patient is well appearing, alert and oriented x 3 today.   Lungs- Clear to ausculation bilaterally, normal work of breathing Cardiac- +LVAD hum  GI- soft, NT, ND, + BS Extremities- no clubbing or cyanosis. No edema  Telemetry    V paced at 50 (personally reviewed)  Hospital Course    Euclide Granito is a 73 y.o. male with a history of VT, NICM, s/p ICD, LVAD, afib, CKD IV, previously requiring HD, CAD s/p PCI who is being seen today for the evaluation of Pacing with device at ERI at the request of Dr. Alease Amend.   Assessment & Plan    Bradycardia -- dual chamber ICD in place At present, he is 100% V-paced at 50 bpm (VVI) Unable to assess underlying rhythm due to the low battery status and risk of device failure with unknown underlying rhythm It's uncertain how depend upon pacing he is. With LVAD, he may need a rate for RV function.  Lynze Reddy plan gen change at next available time, noting increased risk of bleeding and infection, which has been reviewed with the patient and his family.   VT Pt has secondary prevention ICD -- not as  important now with LVAD in place Had planned to NOT replace prior to new pacing need, can discuss whether or not to turn VT therapy back on, as pt has a DF4 lead and Makelle Marrone need new ICD generator (can't downgrade)   Acute stroke Primary reason for admission Goal INR level elevated at 2.5-3 (Have asked to let drift closer to 2 for gen change.    CHFrEF LVAD in place Per primary  For questions or updates, please contact [facility] Please consult [email or web address] for contact info under Cardiology/STEMI.  Signed, Tylene Galla, PA-C  02/09/2024, 9:01 AM   I have seen and examined this patient with Michaelle Adolphus.  Agree with above, note added to reflect my findings.  [place] well without acute complaint  GEN: No acute distress.   Neck: No JVD Cardiac: RRR, no murmurs, rubs, or gallops.  Respiratory: normal BS bases bilaterally. GI: Soft, nontender, non-distended  MS: No edema; No deformity. Neuro:  Nonfocal  Skin: warm and dry, device site well healed Psych: Normal affect    Bradycardia: Patient has a dual-chamber ICD in place.  He is 100% paced at VVI 50.  His device is at end-of-life.  He Maximiano Lott need generator change.  Risks and  benefits have been discussed.  Risk of bleeding and infection.  The patient understands these risks and is agreed to the procedure. Ventricular tachycardia: Patient is post LVAD. Acute stroke: INR goal 2.5-3. Chronic systolic heart failure: LVAD in place.  Plan per primary team  Lukka Black M. Ondre Salvetti MD 02/09/2024 10:50 AM

## 2024-02-09 NOTE — Interval H&P Note (Signed)
 History and Physical Interval Note:  02/09/2024 3:48 PM  Isaac [name]  has presented today for surgery, with the diagnosis of eri.  The various methods of treatment have been discussed with the patient and family. After consideration of risks, benefits and other options for treatment, the patient has consented to  Procedure(s): ICD GENERATOR CHANGEOUT (N/A) as a surgical intervention.  The patient's history has been reviewed, patient examined, no change in status, stable for surgery.  I have reviewed the patient's chart and labs.  Questions were answered to the patient's satisfaction.     Manya Sells

## 2024-02-09 NOTE — Discharge Instructions (Signed)

## 2024-02-09 NOTE — Care Management Important Message (Signed)
 Important Message  Patient Details  Name: Isaac [name] MRN: [id number] Date of Birth: 11-25-1950   Important Message Given:  Yes - Medicare IM     Wynonia Hedges 02/09/2024, 2:48 PM

## 2024-02-09 NOTE — Progress Notes (Signed)
 VAD Coordinator accompanied pt to cath lab for ICD generator change. Pt tolerate procedure well. Pacemaker set at 60bpm and ICD therapies remain off. Device rep to bring home transimisson monitor to pt while hospitalized.  Laurice Pope RN, BSN VAD Coordinator 24/7 Pager [phone]

## 2024-02-09 NOTE — Progress Notes (Signed)
 PHARMACY - ANTICOAGULATION CONSULT NOTE  Pharmacy Consult for warfarin  Indication: LVAD, new acute ischemic infarct   Allergies  Allergen Reactions   Mushroom Extract Complex (Obsolete) Nausea And Vomiting   Doxycycline  Nausea And Vomiting   Neosporin [Neomycin-Bacitracin Zn-Polymyx] Hives   Tape Other (See Comments)    Some tapes/dressings can irritate the skin    Patient Measurements: Height: 5\' 9"  (175.3 cm) Weight: 80.9 kg (178 lb 4.8 oz) IBW/kg (Calculated) : 70.7 HEPARIN  DW (KG): 83.5  Vital Signs: Temp: 98.3 F (36.8 C) (05/21 0312) Temp Source: Oral (05/21 0312) BP: 93/68 (05/21 0312) Pulse Rate: 62 (05/21 0500)  Labs: Recent Labs    02/07/24 0344 02/08/24 0249 02/09/24 0240  HGB 13.5 12.5* 13.0  HCT 42.5 38.9* 39.9  PLT 164 183 178  LABPROT 25.4* 28.8* 27.4*  INR 2.3* 2.7* 2.5*  CREATININE 2.33* 2.30* 2.17*    Estimated Creatinine Clearance: 31.2 mL/min (A) (by C-G formula based on SCr of 2.17 mg/dL (H)).   Medical History: Past Medical History:  Diagnosis Date   Arrhythmia    atrial fibrillation   CHF (congestive heart failure) (HCC)    Chronic kidney disease    Coronary artery disease    Hyperlipidemia    Hypertension    Myocardial infarct Isaac [name])    Assessment: [name] with HM LVAD on warfarin PTA admitted with acute CVA. Home warfarin regimen is 4mg  Mon/2mg  AODs with INR goal 2-2.5. Per HF and neurology, increase INR goal to 2.5-3 for now. Pt previously on heparin  briefly this admit.  Pt with need for gen change this admit by EP, would like INR closer to 2 for this. INR today is 2.5, LDH and CBC ok.  Goal of Therapy:  INR 2.5-3 Monitor platelets by anticoagulation protocol: Yes   Plan:  Warfarin 1mg  x1 again tonight Daily INR   Levin Reamer, PharmD, BCPS, [name] [place] Clinical Pharmacist [phone] Please check AMION for all [facility] Pharmacy numbers 02/09/2024

## 2024-02-09 NOTE — Progress Notes (Signed)
 LVAD Coordinator Rounding Note:  Pt admitted 02/05/24 for altered mental status. Code Stroke activated. CT showed Left PCA and left cerebellar infarct. Pt not candidate for thrombolytic therapy or IR due to the occlusion being too distal.  STAT CT head 1. 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 Hyperdense Left PCA suggests occlusion of that vessel. No hemorrhagic transformation or mass effect. 2. Underlying advanced chronic small vessel disease. 3. Right supraorbital/forehead recent soft tissue injury with no skull fracture identified.  Pt lying in bed resting on my arrival. Pt's wife states she is seeing improvement in pt mentally and physically. States he is restless at night. EP plans for generator change at next available time. Tentatively planned this afternoon after 3pm.  Goal INR now 2.5-3.0 per Neurology.  PT recommending CIR. Insurance authorization pending.  Vital signs: Temp: 97.7 HR: 50 V paced Doppler Pressure: 76 Auto Cuff BP: 84/66 (76) O2 Sat: 97% on RA Wt: 184>183.2>182.9>187.3>178.3 lbs    LVAD interrogation reveals:  Speed: 5300 Flow: 4.8 Power: 3.9w PI: 2.6 Hematocrit: 38  Alarms: none Events: rare  Fixed speed: 5300 Low speed limit: 5000  Drive Line:  Existing VAD dressing C/D/I with anchor intact and accurately applied. Weekly dressing changes per VAD coordinator, or bedside nurse. Next dressing change due: 02/12/24 by bedside nurse or caregiver.  Labs:  LDH trend: 161>175>154>169  INR trend: 2.2>2.6>2.3>2.7>2.5  Anticoagulation Plan: -INR Goal: 2.5-3.0  Device: - [employer] dual - AV paced 60 -Therapies: ERI  Patient seen in [facility] last week for suspected ICD shock.Unable to interrogate as ICD is ERI. Pt originally elected to not move forward with a generator change; however, is currently pacer dependent. AHF team consulted EP to consult for generator change this admission.  Infection:    Plan/Recommendations:  Page VAD coordinator for drive line or equipment concerns 2. Weekly dressing changes.  Laurice Pope RN,BSN VAD Coordinator  Office: [phone]  24/7 Pager: [phone]

## 2024-02-09 NOTE — TOC Initial Note (Signed)
 Transition of Care [facility]) - Initial/Assessment Note    Patient Details  Name: Isaac [name] MRN: [id number] Date of Birth: 14-Dec-1950  Transition of Care [facility]) CM/SW Contact:    Benjiman Bras, RN Phone Number: [phone] 02/09/2024, 8:35 AM  Clinical Narrative:                 TOC CM spoke to pt's wife, plan is for IP rehab. CIR following for IP rehab. Pt lives at home with wife. Has RW and scale at home. Wife at home to assist with care.   Expected Discharge Plan: IP Rehab Facility Barriers to Discharge: Continued Medical Work up   Patient Goals and CMS Choice   CMS Medicare.gov Compare Post Acute Care list provided to:: Patient Represenative (must comment) Choice offered to / list presented to : Spouse [place] ownership interest in [facility].provided to:: Spouse    Expected Discharge Plan and Services     Post Acute Care Choice: Skilled Nursing Facility Living arrangements for the past 2 months: Single Family Home                                      Prior Living Arrangements/Services Living arrangements for the past 2 months: Single Family Home Lives with:: Spouse Patient language and need for interpreter reviewed:: Yes        Need for Family Participation in Patient Care: Yes (Comment) Care giver support system in place?: Yes (comment)   Criminal Activity/Legal Involvement Pertinent to Current Situation/Hospitalization: No - Comment as needed  Activities of Daily Living   ADL Screening (condition at time of admission) Independently performs ADLs?: No Does the patient have a NEW difficulty with bathing/dressing/toileting/self-feeding that is expected to last >3 days?: Yes (Initiates electronic notice to provider for possible OT consult) Does the patient have a NEW difficulty with getting in/out of bed, walking, or climbing stairs that is expected to last >3 days?: Yes (Initiates electronic notice to provider for possible PT  consult) Does the patient have a NEW difficulty with communication that is expected to last >3 days?: No Is the patient deaf or have difficulty hearing?: No Does the patient have difficulty seeing, even when wearing glasses/contacts?: No Does the patient have difficulty concentrating, remembering, or making decisions?: Yes  Permission Sought/Granted Permission sought to share information with : Case Manager Permission granted to share information with : Yes, Verbal Permission Granted  Share Information with NAME: Honorio Devol  Permission granted to share info w AGENCY: IP rehab, SNF  Permission granted to share info w Relationship: wife  Permission granted to share info w Contact Information: [phone]  Emotional Assessment              Admission diagnosis:  Cerebrovascular accident (CVA), unspecified mechanism (HCC) [I63.9] Acute stroke due to ischemia [facility]) [I63.9] Patient Active Problem List   Diagnosis Date Noted   Acute stroke due to ischemia (HCC) 02/05/2024   Abnormal thyroid  blood test 04/05/2023   Abscess of left groin 11/27/2022   VT (ventricular tachycardia) (HCC) 10/27/2022   ICD (implantable cardioverter-defibrillator) in place 10/27/2022   Pressure injury of skin 10/07/2021   LVAD (left ventricular assist device) present (HCC)    Protein-calorie malnutrition, severe 09/10/2021   Elevated TSH    PICC (peripherally inserted central catheter) in place    Cardiogenic shock (HCC)    Syncope 09/07/2021   CHF (  congestive heart failure) (HCC) 09/06/2021   CAD (coronary artery disease) 08/22/2021   Cardiomyopathy (HCC) 08/22/2021   Mixed hyperlipidemia 08/22/2021   A-fib (HCC) 08/22/2021   Elevated troponin 08/22/2021   Acute on chronic combined systolic and diastolic CHF (congestive heart failure) (HCC) 08/22/2021   Benign essential hypertension 02/22/2015   Chronic systolic heart failure (HCC) 07/05/2014   PCP:  Lyle San, MD Pharmacy:   [id number] [place], [place] - [address] [place] [address] Phone: [phone] Fax: [phone]  [facility] [address] [place] - [address] [place] [address] Phone: [phone] Fax: [phone]     Social Drivers of Health (SDOH) Social History: SDOH Screenings   Food Insecurity: No Food Insecurity (02/05/2024)  Housing: Low Risk  (02/05/2024)  Transportation Needs: No Transportation Needs (02/05/2024)  Utilities: Not At Risk (02/05/2024)  Depression (PHQ2-9): Low Risk  (04/14/2022)  Social Connections: Moderately Isolated (02/05/2024)  Tobacco Use: Medium Risk (02/05/2024)   SDOH Interventions:     Readmission Risk Interventions     No data to display

## 2024-02-09 NOTE — Plan of Care (Signed)
  Problem: Education: Goal: Patient will understand all VAD equipment and how it functions Outcome: Progressing Goal: Patient will be able to verbalize current INR target range and antiplatelet therapy for discharge home Outcome: Progressing   Problem: Cardiac: Goal: LVAD will function as expected and patient will experience no clinical alarms Outcome: Progressing   Problem: Education: Goal: Knowledge of General Education information will improve Description: Including pain rating scale, medication(s)/side effects and non-pharmacologic comfort measures Outcome: Progressing   Problem: Health Behavior/Discharge Planning: Goal: Ability to manage health-related needs will improve Outcome: Progressing   Problem: Clinical Measurements: Goal: Ability to maintain clinical measurements within normal limits will improve Outcome: Progressing Goal: Will remain free from infection Outcome: Progressing Goal: Diagnostic test results will improve Outcome: Progressing Goal: Respiratory complications will improve Outcome: Progressing Goal: Cardiovascular complication will be avoided Outcome: Progressing   Problem: Activity: Goal: Risk for activity intolerance will decrease Outcome: Progressing   Problem: Nutrition: Goal: Adequate nutrition will be maintained Outcome: Progressing   Problem: Coping: Goal: Level of anxiety will decrease Outcome: Progressing   Problem: Elimination: Goal: Will not experience complications related to bowel motility Outcome: Progressing Goal: Will not experience complications related to urinary retention Outcome: Progressing   Problem: Pain Managment: Goal: General experience of comfort will improve and/or be controlled Outcome: Progressing   Problem: Safety: Goal: Ability to remain free from injury will improve Outcome: Progressing   Problem: Skin Integrity: Goal: Risk for impaired skin integrity will decrease Outcome: Progressing   Problem:  Education: Goal: Knowledge of disease or condition will improve Outcome: Progressing Goal: Knowledge of secondary prevention will improve (MUST DOCUMENT ALL) Outcome: Progressing Goal: Knowledge of patient specific risk factors will improve (DELETE if not current risk factor) Outcome: Progressing   Problem: Ischemic Stroke/TIA Tissue Perfusion: Goal: Complications of ischemic stroke/TIA will be minimized Outcome: Progressing   Problem: Coping: Goal: Will verbalize positive feelings about self Outcome: Progressing Goal: Will identify appropriate support needs Outcome: Progressing   Problem: Health Behavior/Discharge Planning: Goal: Ability to manage health-related needs will improve Outcome: Progressing Goal: Goals will be collaboratively established with patient/family Outcome: Progressing   Problem: Self-Care: Goal: Ability to participate in self-care as condition permits will improve Outcome: Progressing Goal: Verbalization of feelings and concerns over difficulty with self-care will improve Outcome: Progressing Goal: Ability to communicate needs accurately will improve Outcome: Progressing   Problem: Nutrition: Goal: Risk of aspiration will decrease Outcome: Progressing Goal: Dietary intake will improve Outcome: Progressing   Problem: Education: Goal: Knowledge of cardiac device and self-care will improve Outcome: Progressing Goal: Ability to safely manage health related needs after discharge will improve Outcome: Progressing Goal: Individualized Educational Video(s) Outcome: Progressing   Problem: Cardiac: Goal: Ability to achieve and maintain adequate cardiopulmonary perfusion will improve Outcome: Progressing

## 2024-02-09 NOTE — Progress Notes (Signed)
 Occupational Therapy Treatment Patient Details Name: Isaac [name] MRN: [id number] DOB: June 19, 1951 Today's Date: 02/09/2024   History of present illness Pt is a 73 y/o M presenting to ED with concernsf or his defibrillator activating, per spouse was confused wtih R weakness, wordfinding difficulties an R visual field deficit. CTA with L PCA and L cerebellar infarct. PMH includes HTN, HLD, CAD, CKD IV, CHF, HM III LVAD   OT comments  Pt progressing toward goals this session, pt needs mod A to switch LVAD from wall > batteries, and donning VAD vest. Pt ambulates to sink for oral care, needs min A cues to turn to R to locate toothbrush/toothpaste and to avoid running into bed. Pt presenting with impairments listed below, will follow acutely. Patient will benefit from intensive inpatient follow-up therapy, >3 hours/day  to maximize safety/ind with ADL/functional mobility.       If plan is discharge home, recommend the following:  A lot of help with walking and/or transfers;A lot of help with bathing/dressing/bathroom;Assistance with cooking/housework;Direct supervision/assist for medications management;Direct supervision/assist for financial management;Assist for transportation;Help with stairs or ramp for entrance;Supervision due to cognitive status   Equipment Recommendations  Other (comment) (defer)    Recommendations for Other Services PT consult;Rehab consult    Precautions / Restrictions Precautions Precautions: Fall Precaution/Restrictions Comments: LVAD, R neglect Restrictions Weight Bearing Restrictions Per Provider Order: No       Mobility Bed Mobility Overal bed mobility: Needs Assistance Bed Mobility: Supine to Sit     Supine to sit: Supervision          Transfers Overall transfer level: Needs assistance Equipment used: Rolling walker (2 wheels) Transfers: Sit to/from Stand, Bed to chair/wheelchair/BSC Sit to Stand: Min assist                 Balance  Overall balance assessment: Needs assistance Sitting-balance support: Feet supported Sitting balance-Leahy Scale: Fair     Standing balance support: Reliant on assistive device for balance, During functional activity Standing balance-Leahy Scale: Poor Standing balance comment: reliant on external support                           ADL either performed or assessed with clinical judgement   ADL Overall ADL's : Needs assistance/impaired     Grooming: Minimal assistance;Standing Grooming Details (indicate cue type and reason): cues         Upper Body Dressing : Moderate assistance Upper Body Dressing Details (indicate cue type and reason): donning LVAD vest, swtich VAD wall > batteries     Toilet Transfer: Minimal assistance;Ambulation;Rolling walker (2 wheels) Toilet Transfer Details (indicate cue type and reason): R lateral lean when fatigued and BLE buckling after incr time standing         Functional mobility during ADLs: Minimal assistance;Rolling walker (2 wheels)      Extremity/Trunk Assessment Upper Extremity Assessment Upper Extremity Assessment: Generalized weakness RUE Deficits / Details: proximal vs distal weakness, overall 3/5 globally RUE Coordination: decreased fine motor;decreased gross motor   Lower Extremity Assessment Lower Extremity Assessment: Defer to PT evaluation        Vision   Vision Assessment?: Yes Eye Alignment: Within Functional Limits Alignment/Gaze Preference: Within Defined Limits Tracking/Visual Pursuits: Decreased smoothness of eye movement to RIGHT inferior field;Decreased smoothness of eye movement to RIGHT superior field Visual Fields: Right visual field deficit Additional Comments: needs cues to turn to R side but seems more aware of deficits this session  Perception Perception Perception: Impaired Preception Impairment Details: Inattention/Neglect Perception-Other Comments: R   Praxis     Communication  Communication Communication: No apparent difficulties   Cognition Arousal: Alert Behavior During Therapy: WFL for tasks assessed/performed Cognition: Cognition impaired   Orientation impairments: Situation Awareness: Online awareness impaired Memory impairment (select all impairments): Short-term memory, Declarative long-term memory, Working memory Attention impairment (select first level of impairment): Sustained attention Executive functioning impairment (select all impairments): Problem solving, Reasoning OT - Cognition Comments: pt needing incr time to sequence switching LVAD wall > batteries, needs decr distractions and simple cues                 Following commands: Impaired Following commands impaired: Follows one step commands inconsistently      Cueing   Cueing Techniques: Verbal cues  Exercises      Shoulder Instructions       General Comments VSS    Pertinent Vitals/ Pain       Pain Assessment Pain Assessment: No/denies pain  Home Living                                          Prior Functioning/Environment              Frequency  Min 2X/week        Progress Toward Goals  OT Goals(current goals can now be found in the care plan section)  Progress towards OT goals: Progressing toward goals  Acute Rehab OT Goals Patient Stated Goal: none stated OT Goal Formulation: With patient Time For Goal Achievement: 02/21/24 Potential to Achieve Goals: Good ADL Goals Pt Will Perform Grooming: with modified independence;sitting Pt Will Perform Upper Body Dressing: with modified independence;sitting;standing Pt Will Perform Lower Body Dressing: with modified independence;sitting/lateral leans;sit to/from stand Pt Will Transfer to Toilet: with modified independence;ambulating;regular height toilet  Plan      Co-evaluation                 AM-PAC OT "6 Clicks" Daily Activity     Outcome Measure   Help from another person  eating meals?: A Little Help from another person taking care of personal grooming?: A Little Help from another person toileting, which includes using toliet, bedpan, or urinal?: A Lot Help from another person bathing (including washing, rinsing, drying)?: A Lot Help from another person to put on and taking off regular upper body clothing?: A Lot Help from another person to put on and taking off regular lower body clothing?: A Lot 6 Click Score: 14    End of Session Equipment Utilized During Treatment: Rolling walker (2 wheels);Gait belt  OT Visit Diagnosis: Unsteadiness on feet (R26.81);Other abnormalities of gait and mobility (R26.89);Muscle weakness (generalized) (M62.81);Other symptoms and signs involving cognitive function;Other symptoms and signs involving the nervous system (R29.898)   Activity Tolerance Patient tolerated treatment well   Patient Left with call bell/phone within reach;in chair;with chair alarm set;with family/visitor present   Nurse Communication Mobility status        Time: 5621-3086 OT Time Calculation (min): 52 min  Charges: OT General Charges $OT Visit: 1 Visit OT Treatments $Self Care/Home Management : 38-52 mins  Osborn Pullin K, OTD, OTR/L SecureChat Preferred Acute Rehab [phone]   Benedict Brain Koonce 02/09/2024, 12:32 PM

## 2024-02-09 NOTE — Progress Notes (Addendum)
 Patient Name: Isaac [name] Date of Encounter: 02/10/24   Primary Cardiologist: Peder Bourdon, MD Electrophysiologist: Richardo Chandler, MD  Interval Summary   S/p gen change 5/21 by Dr. Carolynne Citron  The patient is doing well today.  Wife thinks he looks better.   Vital Signs    Vitals:   02/09/24 2307 02/10/24 0450 02/10/24 0719 02/10/24 0800  BP: 100/77 101/70 90/77 90/77   Pulse: 60 96 (!) 103   Resp: 18 14 14    Temp: 98.2 F (36.8 C) 98 F (36.7 C) 98 F (36.7 C)   TempSrc: Oral Oral Oral   SpO2: 96% 95% 93%   Weight:  80.7 kg    Height:        Intake/Output Summary (Last 24 hours) at 02/10/2024 0851 Last data filed at 02/10/2024 0451 Gross per 24 hour  Intake 216.83 ml  Output 1050 ml  Net -833.17 ml   Filed Weights   02/08/24 0500 02/09/24 0500 02/10/24 0450  Weight: 85 kg 80.9 kg 80.7 kg    Physical Exam    GEN: Well nourished, well developed in no acute distress NECK: No JVD; No carotid bruits CARDIAC: Regular rate and rhythm, no murmurs, rubs, gallops RESPIRATORY:  Clear to auscultation without rales, wheezing or rhonchi  ABDOMEN: Soft, non-tender, non-distended EXTREMITIES:  No edema; No deformity    Telemetry    AV dual pacing 60s currently (personally reviewed)  Hospital Course    Finnian Husted is a 73 y.o. male with a history of VT, NICM, s/p ICD, LVAD, afib, CKD IV, previously requiring HD, CAD s/p PCI who is being seen today for the evaluation of Pacing with device at ERI at the request of Dr. Alease Amend.   Assessment & Plan    Bradycardia -- dual chamber ICD in place S/p gen change 02/09/2024 Wound care reviewed and usual follow up in place Keep pressure dressing in place while remains here.  Can leave on for several days if going to CIR.   VT H/o VT Pt and family have opted to leave HV therapies off at this time.   Acute stroke Primary reason for admission New goal INR 2.5-3.0 chronically   CHFrEF LVAD in place Per  primary  EP follow up in place, will see as needed while remains here, will remove pressure dressing in 1-3 days, depending on his stay.   For questions or updates, please contact [facility] Please consult [email or web address] for contact info under Cardiology/STEMI.  Signed, Tylene Galla, PA-C  02/10/2024, 8:51 AM   I have seen, examined the patient, and reviewed the above assessment and plan.    Interval: Gen change yesterday. No acute overnight events. Reports feeling relatively well today.   General: Well developed, in no acute distress.  Neck: No JVD.  Cardiac: LVAD hum. Left chest pressure dressing in place.  Resp: Normal work of breathing.  Ext: No edema.  Neuro: No gross focal deficits.  Psych: Normal affect.   Assessment: Isaac [name] is a 73 y.o. male with a history of VT, NICM, s/p ICD, LVAD, atrial fibrillation, CKD IV, previously requiring HD, CAD s/p PCI who underwent gen change on 5/21 with Dr. Carolynne Citron.  Problem List: S/p DDD ICD VT Acute stroke Chronic systolic heart failure s/p LVAD  Plan:  -Leave pressure dressing in place for at least 48 hours post implant.  -Pt and family have opted to leave HV therapies off at this time. -Wound care reviewed and usual follow up in place  Ardeen Kohler, MD 02/10/2024 3:13 PM

## 2024-02-09 NOTE — Plan of Care (Signed)
  Problem: Cardiac: Goal: LVAD will function as expected and patient will experience no clinical alarms Outcome: Progressing   Problem: Education: Goal: Patient will understand all VAD equipment and how it functions Outcome: Progressing Goal: Patient will be able to verbalize current INR target range and antiplatelet therapy for discharge home Outcome: Progressing   Problem: Education: Goal: Knowledge of General Education information will improve Description: Including pain rating scale, medication(s)/side effects and non-pharmacologic comfort measures Outcome: Progressing   Problem: Health Behavior/Discharge Planning: Goal: Ability to manage health-related needs will improve Outcome: Progressing   Problem: Clinical Measurements: Goal: Ability to maintain clinical measurements within normal limits will improve Outcome: Progressing Goal: Will remain free from infection Outcome: Progressing Goal: Diagnostic test results will improve Outcome: Progressing Goal: Respiratory complications will improve Outcome: Progressing Goal: Cardiovascular complication will be avoided Outcome: Progressing   Problem: Activity: Goal: Risk for activity intolerance will decrease Outcome: Progressing   Problem: Nutrition: Goal: Adequate nutrition will be maintained Outcome: Progressing   Problem: Coping: Goal: Level of anxiety will decrease Outcome: Progressing   Problem: Elimination: Goal: Will not experience complications related to bowel motility Outcome: Progressing Goal: Will not experience complications related to urinary retention Outcome: Progressing   Problem: Pain Managment: Goal: General experience of comfort will improve and/or be controlled Outcome: Progressing   Problem: Safety: Goal: Ability to remain free from injury will improve Outcome: Progressing   Problem: Skin Integrity: Goal: Risk for impaired skin integrity will decrease Outcome: Progressing   Problem:  Education: Goal: Knowledge of disease or condition will improve Outcome: Progressing Goal: Knowledge of secondary prevention will improve (MUST DOCUMENT ALL) Outcome: Progressing Goal: Knowledge of patient specific risk factors will improve (DELETE if not current risk factor) Outcome: Progressing   Problem: Ischemic Stroke/TIA Tissue Perfusion: Goal: Complications of ischemic stroke/TIA will be minimized Outcome: Progressing   Problem: Coping: Goal: Will verbalize positive feelings about self Outcome: Progressing Goal: Will identify appropriate support needs Outcome: Progressing   Problem: Health Behavior/Discharge Planning: Goal: Ability to manage health-related needs will improve Outcome: Progressing Goal: Goals will be collaboratively established with patient/family Outcome: Progressing   Problem: Self-Care: Goal: Ability to participate in self-care as condition permits will improve Outcome: Progressing Goal: Verbalization of feelings and concerns over difficulty with self-care will improve Outcome: Progressing Goal: Ability to communicate needs accurately will improve Outcome: Progressing   Problem: Nutrition: Goal: Risk of aspiration will decrease Outcome: Progressing Goal: Dietary intake will improve Outcome: Progressing   Problem: Education: Goal: Knowledge of cardiac device and self-care will improve Outcome: Progressing Goal: Ability to safely manage health related needs after discharge will improve Outcome: Progressing Goal: Individualized Educational Video(s) Outcome: Progressing   Problem: Cardiac: Goal: Ability to achieve and maintain adequate cardiopulmonary perfusion will improve Outcome: Progressing

## 2024-02-09 NOTE — Progress Notes (Signed)
 Inpatient Rehab Admissions Coordinator:    CIR following. Case send to insurance this AM. ICD generator to be changed this afternoon. Will follow for potential admit pending insurance auth and medical clearance.   Wandalee Gust, MS, CCC-SLP Rehab Admissions Coordinator  [phone] (celll) [phone] (office)

## 2024-02-09 NOTE — H&P (View-Only) (Signed)
 Patient Name: Isaac [name] Date of Encounter: 02/09/2024  Primary Cardiologist: Peder Bourdon, MD Electrophysiologist: Richardo Chandler, MD  Interval Summary   The patient is doing [name] today.  At this time, the patient denies chest pain, shortness of breath, or any new concerns.  Vital Signs    Vitals:   02/09/24 0312 02/09/24 0500 02/09/24 0744 02/09/24 0800  BP: 93/68  (!) 85/68 (!) 84/66  Pulse: (!) 35 62 (!) 50 (!) 29  Resp: 15 13  18   Temp: 98.3 F (36.8 C)  97.7 F (36.5 C)   TempSrc: Oral  Oral   SpO2: 97% 99%  96%  Weight:  80.9 kg    Height:        Intake/Output Summary (Last 24 hours) at 02/09/2024 0901 Last data filed at 02/09/2024 0748 Gross per 24 hour  Intake 480 ml  Output 400 ml  Net 80 ml   Filed Weights   02/07/24 0500 02/08/24 0500 02/09/24 0500  Weight: 83 kg 85 kg 80.9 kg    Physical Exam    GEN- The patient is [name] appearing, alert and oriented x 3 today.   Lungs- Clear to ausculation bilaterally, normal work of breathing Cardiac- +LVAD hum  GI- soft, NT, ND, + BS Extremities- no clubbing or cyanosis. No edema  Telemetry    V paced at 50 (personally reviewed)  Hospital Course    Isaac [name] is a 73 y.o. male with a history of VT, NICM, s/p ICD, LVAD, afib, CKD IV, previously requiring HD, CAD s/p PCI who is being seen today for the evaluation of Pacing with device at [facility] at the request of Dr. Alease Amend.   Assessment & Plan    Bradycardia -- dual chamber ICD in place At present, he is 100% V-paced at 50 bpm (VVI) Unable to assess underlying rhythm due to the low battery status and risk of device failure with unknown underlying rhythm It's uncertain how depend upon pacing he is. With LVAD, he may need a rate for RV function.  Isaac [name] plan gen change at next available time, noting increased risk of bleeding and infection, which has been reviewed with the patient and his family.   VT Pt has secondary prevention ICD -- not as  important now with LVAD in place Had planned to NOT replace prior to new pacing need, can discuss whether or not to turn VT therapy back on, as pt has a DF4 lead and Isaac [name] need new ICD generator (can't downgrade)   Acute stroke Primary reason for admission Goal INR level elevated at 2.5-3 (Have asked to let drift closer to 2 for gen change.    CHFrEF LVAD in place Per primary  For questions or updates, please contact [facility] Please consult [email or web address] for contact info under Cardiology/STEMI.  Signed, Isaac Galla, PA-C  02/09/2024, 9:01 AM   I have seen and examined this patient with Isaac [name].  Agree with above, note added to reflect my findings.  Isaac [name] without acute complaint  GEN: No acute distress.   Neck: No JVD Cardiac: RRR, no murmurs, rubs, or gallops.  Respiratory: normal BS bases bilaterally. GI: Soft, nontender, non-distended  MS: No edema; No deformity. Neuro:  Nonfocal  Skin: warm and dry, device site [name] healed Psych: Normal affect    Bradycardia: Patient has a dual-chamber ICD in place.  He is 100% paced at VVI 50.  His device is at end-of-life.  He Isaac [name] need generator change.  Risks and  benefits have been discussed.  Risk of bleeding and infection.  The patient understands these risks and is agreed to the procedure. Ventricular tachycardia: Patient is post LVAD. Acute stroke: INR goal 2.5-3. Chronic systolic heart failure: LVAD in place.  Plan per primary team  Isaac [name] M. Isaac Salvetti MD 02/09/2024 10:50 AM

## 2024-02-09 NOTE — Progress Notes (Signed)
 Advanced Heart Failure VAD Team Note  PCP-Cardiologist: Peder Bourdon, MD   Chief Complaint: CVA   Patient Profile   73 y/o chronic systolic heart failure s/p HM3 LVAD, CKD Stage IV, permanent atrial fibrillation admitted w/ acute CVA, likely embolic. ICD also noted to be at EOL.   Subjective:    Going for generator change this afternoon.  Intermittently confused. At sink brushing his teeth when we entered room. Making small improvements each day.  LVAD INTERROGATION:  HeartMate III LVAD:   Flow 4.7 liters/min, speed 5300, power 3.8, PI 2.4.  3 PI events so far today.   Objective:    Vital Signs:   Temp:  [97.5 F (36.4 C)-98.5 F (36.9 C)] 97.7 F (36.5 C) (05/21 0744) Pulse Rate:  [29-157] 29 (05/21 0800) Resp:  [12-20] 18 (05/21 0800) BP: (79-98)/(50-70) 84/66 (05/21 0800) SpO2:  [96 %-100 %] 96 % (05/21 0800) Weight:  [80.9 kg] 80.9 kg (05/21 0500) Last BM Date : 02/05/24 Mean arterial Pressure 76  Intake/Output:   Intake/Output Summary (Last 24 hours) at 02/09/2024 1012 Last data filed at 02/09/2024 0748 Gross per 24 hour  Intake 480 ml  Output 400 ml  Net 80 ml     Physical Exam    Physical Exam: GENERAL: Thin, chronically ill appearing NECK: JVP 7-8  CARDIAC:  Mechanical heart sounds with LVAD hum present.  LUNGS:  Clear to auscultation bilaterally.     LVAD exit site:   Dressing dry and intact.  EXTREMITIES:  no edema  NEUROLOGIC: Alert. Aphasia somewhat better. Intermittently confused    Telemetry   V paced 50, PVCs  EKG    [name]/A   Labs   Basic Metabolic Panel: Recent Labs  Lab 02/02/24 2010 02/05/24 1106 02/05/24 1200 02/06/24 0334 02/07/24 0344 02/08/24 0249 02/09/24 0240  NA 140 138 141 136 141 136 137  K 3.3* 4.2 4.3 4.3 4.2 4.3 4.4  CL 116* 108 108 108 107 108 107  CO2 20* 21*  --  20* 20* 18* 20*  GLUCOSE 93 106* 101* 96 81 92 100*  BUN 25* 42* 40* 36* 33* 30* 29*  CREATININE 1.69* 2.48* 2.60* 2.40* 2.33* 2.30* 2.17*   CALCIUM  7.2* 9.3  --  8.7* 9.1 8.6* 8.9  MG 1.5* 2.0  --   --   --   --   --     Liver Function Tests: Recent Labs  Lab 02/05/24 1106  AST 26  ALT 21  ALKPHOS 92  BILITOT 1.5*  PROT 6.8  ALBUMIN  3.5   No results for input(s): "LIPASE", "AMYLASE" in the last 168 hours. No results for input(s): "AMMONIA" in the last 168 hours.  CBC: Recent Labs  Lab 02/05/24 1106 02/05/24 1200 02/06/24 0334 02/07/24 0344 02/08/24 0249 02/09/24 0240  WBC 6.2  --  7.3 5.3 4.5 3.8*  NEUTROABS 4.5  --   --   --   --   --   HGB 13.1 13.9 11.6* 13.5 12.5* 13.0  HCT 40.8 41.0 35.2* 42.5 38.9* 39.9  MCV 92.1  --  90.3 93.2 91.3 91.7  PLT 171  --  145* 164 183 178    INR: Recent Labs  Lab 02/05/24 1106 02/06/24 0334 02/07/24 0344 02/08/24 0249 02/09/24 0240  INR 2.2* 2.6* 2.3* 2.7* 2.5*    Other results: EKG:    Imaging   No results found.   Medications:     Scheduled Medications:  Chlorhexidine  Gluconate Cloth  6 each Topical Daily  feeding supplement  237 mL Oral BID BM   furosemide   40 mg Oral QODAY   magnesium  oxide  400 mg Oral Daily   pantoprazole   40 mg Oral Daily   potassium chloride   20 mEq Oral Daily   rosuvastatin   10 mg Oral QHS   sildenafil   20 mg Oral TID   warfarin  1 mg Oral ONCE-1600   Warfarin - Pharmacist Dosing Inpatient   Does not apply q1600    Infusions:   PRN Medications: acetaminophen , ondansetron  (ZOFRAN ) IV, ondansetron , mouth rinse, mouth rinse, prochlorperazine     Assessment/Plan:    1. 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 Hyperdense Left PCA suggests occlusion of that vessel. No hemorrhagic transformation or mass effect.  - f/u CT brain 5/18 evolution of L PCA, L cerebellar and R MCA strokes. No hemorrhage - Echo without clear source of embolization - Seen by Neuro. Goal INR increased 2.5-3.0. INR 2.5 today - Appreciate PT/OT. Will need CIR,  insurance auth can be started after gen change  2. Chronic systolic CHF:  Long-standing cardiomyopathy.  [employer] ICD. S/p HM-3 VAD 09/17/21.  - NYHA I at baseline - On sildenafil  for RV dysfunction - Volume status okay. Continue PO Lasix  40 mg every other day  - VAD interrogated personally. Parameters stable.  - DL ok  - ICD at EOL. Possible recent ICD shock but unclear. ICD now pacing at 50 (seems dependent). Discussed w/ EP. Plan for gen change this afternoon.   3. Tricuspid regurgitation:  Tricuspid repair not done at time of VAD due to proximity of ICD wires and hypotension during surgery.  He has severe TR.    4. Permanent Atrial fibrillation: - Atrial fibrillation now chronic, he is off amiodarone  with resolution of nausea. - Unclear underlying rhythm, likely afib   5. CKD stage IV:  - baseline Scr ~2.5.  - Avoid contrast studies as much as possible - Scr stable 2.17 today - Could consider SGLT2i prior to discharge and transition lasix  to PRN - Follows with nephrology.    6. CAD: History of PCI to OM1 in 2007 and RCA in 2013.   - No s/s angina - continue statin   7. GERD with coffee ground emesis - Hgb stable - continue PPI    I reviewed the LVAD parameters from today, and compared the results to the patient's prior recorded data.  No programming changes were made.  The LVAD is functioning within specified parameters.  The patient performs LVAD self-test daily.  LVAD interrogation was negative for any significant power changes, alarms or PI events/speed drops.  LVAD equipment check completed and is in good working order.  Back-up equipment present.   LVAD education done on emergency procedures and precautions and reviewed exit site care.  Length of Stay: 4  Isaac Oakland N, PA-C 02/09/2024, 10:12 AM  VAD Team --- VAD ISSUES ONLY--- Pager [phone] (7am - 7am)  Advanced Heart Failure Team  Pager [phone] (M-F; 7a - 5p)  Please contact [facility] for  night-coverage after hours (5p -7a ) and weekends on [email or web address]

## 2024-02-10 ENCOUNTER — Other Ambulatory Visit: Payer: Self-pay

## 2024-02-10 ENCOUNTER — Encounter (HOSPITAL_COMMUNITY): Payer: Self-pay | Admitting: Internal Medicine

## 2024-02-10 ENCOUNTER — Inpatient Hospital Stay (HOSPITAL_COMMUNITY)
Admission: AD | Admit: 2024-02-10 | Discharge: 2024-02-18 | DRG: 057 | Disposition: A | Discharge status: Home / Self Care | Source: Intra-hospital | Attending: Physical Medicine & Rehabilitation | Admitting: Physical Medicine & Rehabilitation

## 2024-02-10 ENCOUNTER — Other Ambulatory Visit (HOSPITAL_COMMUNITY)

## 2024-02-10 DIAGNOSIS — Z95811 Presence of heart assist device: Secondary | ICD-10-CM

## 2024-02-10 DIAGNOSIS — I69320 Aphasia following cerebral infarction: Secondary | ICD-10-CM

## 2024-02-10 DIAGNOSIS — E785 Hyperlipidemia, unspecified: Secondary | ICD-10-CM | POA: Diagnosis present

## 2024-02-10 DIAGNOSIS — I130 Hypertensive heart and chronic kidney disease with heart failure and stage 1 through stage 4 chronic kidney disease, or unspecified chronic kidney disease: Secondary | ICD-10-CM | POA: Diagnosis present

## 2024-02-10 DIAGNOSIS — N184 Chronic kidney disease, stage 4 (severe): Secondary | ICD-10-CM | POA: Diagnosis present

## 2024-02-10 DIAGNOSIS — Z9109 Other allergy status, other than to drugs and biological substances: Secondary | ICD-10-CM

## 2024-02-10 DIAGNOSIS — H538 Other visual disturbances: Secondary | ICD-10-CM | POA: Diagnosis present

## 2024-02-10 DIAGNOSIS — N1832 Chronic kidney disease, stage 3b: Secondary | ICD-10-CM

## 2024-02-10 DIAGNOSIS — D649 Anemia, unspecified: Secondary | ICD-10-CM | POA: Diagnosis present

## 2024-02-10 DIAGNOSIS — I5022 Chronic systolic (congestive) heart failure: Secondary | ICD-10-CM | POA: Diagnosis present

## 2024-02-10 DIAGNOSIS — K219 Gastro-esophageal reflux disease without esophagitis: Secondary | ICD-10-CM | POA: Diagnosis present

## 2024-02-10 DIAGNOSIS — I428 Other cardiomyopathies: Secondary | ICD-10-CM | POA: Diagnosis present

## 2024-02-10 DIAGNOSIS — Z8249 Family history of ischemic heart disease and other diseases of the circulatory system: Secondary | ICD-10-CM

## 2024-02-10 DIAGNOSIS — Z881 Allergy status to other antibiotic agents status: Secondary | ICD-10-CM

## 2024-02-10 DIAGNOSIS — I639 Cerebral infarction, unspecified: Secondary | ICD-10-CM | POA: Diagnosis not present

## 2024-02-10 DIAGNOSIS — I69398 Other sequelae of cerebral infarction: Secondary | ICD-10-CM

## 2024-02-10 DIAGNOSIS — I071 Rheumatic tricuspid insufficiency: Secondary | ICD-10-CM | POA: Diagnosis present

## 2024-02-10 DIAGNOSIS — I2510 Atherosclerotic heart disease of native coronary artery without angina pectoris: Secondary | ICD-10-CM | POA: Diagnosis present

## 2024-02-10 DIAGNOSIS — Z87891 Personal history of nicotine dependence: Secondary | ICD-10-CM

## 2024-02-10 DIAGNOSIS — R4189 Other symptoms and signs involving cognitive functions and awareness: Secondary | ICD-10-CM | POA: Diagnosis present

## 2024-02-10 DIAGNOSIS — Z91018 Allergy to other foods: Secondary | ICD-10-CM

## 2024-02-10 DIAGNOSIS — G4700 Insomnia, unspecified: Secondary | ICD-10-CM

## 2024-02-10 DIAGNOSIS — I69392 Facial weakness following cerebral infarction: Secondary | ICD-10-CM

## 2024-02-10 DIAGNOSIS — Z79899 Other long term (current) drug therapy: Secondary | ICD-10-CM | POA: Diagnosis not present

## 2024-02-10 DIAGNOSIS — I63532 Cerebral infarction due to unspecified occlusion or stenosis of left posterior cerebral artery: Secondary | ICD-10-CM | POA: Diagnosis not present

## 2024-02-10 DIAGNOSIS — I69351 Hemiplegia and hemiparesis following cerebral infarction affecting right dominant side: Secondary | ICD-10-CM | POA: Diagnosis present

## 2024-02-10 DIAGNOSIS — I252 Old myocardial infarction: Secondary | ICD-10-CM

## 2024-02-10 DIAGNOSIS — K5900 Constipation, unspecified: Secondary | ICD-10-CM | POA: Diagnosis not present

## 2024-02-10 DIAGNOSIS — I482 Chronic atrial fibrillation, unspecified: Secondary | ICD-10-CM | POA: Diagnosis present

## 2024-02-10 DIAGNOSIS — F05 Delirium due to known physiological condition: Secondary | ICD-10-CM

## 2024-02-10 DIAGNOSIS — Z95810 Presence of automatic (implantable) cardiac defibrillator: Secondary | ICD-10-CM | POA: Diagnosis not present

## 2024-02-10 DIAGNOSIS — Z83438 Family history of other disorder of lipoprotein metabolism and other lipidemia: Secondary | ICD-10-CM

## 2024-02-10 DIAGNOSIS — T148XXA Other injury of unspecified body region, initial encounter: Secondary | ICD-10-CM | POA: Diagnosis not present

## 2024-02-10 DIAGNOSIS — Z9861 Coronary angioplasty status: Secondary | ICD-10-CM

## 2024-02-10 DIAGNOSIS — Z7901 Long term (current) use of anticoagulants: Secondary | ICD-10-CM | POA: Diagnosis not present

## 2024-02-10 DIAGNOSIS — I959 Hypotension, unspecified: Secondary | ICD-10-CM

## 2024-02-10 DIAGNOSIS — K5901 Slow transit constipation: Secondary | ICD-10-CM | POA: Diagnosis not present

## 2024-02-10 DIAGNOSIS — N183 Chronic kidney disease, stage 3 unspecified: Secondary | ICD-10-CM | POA: Diagnosis not present

## 2024-02-10 LAB — CBC
HCT: 37.8 % — ABNORMAL LOW (ref 39.0–52.0)
Hemoglobin: 12.3 g/dL — ABNORMAL LOW (ref 13.0–17.0)
MCH: 29.8 pg (ref 26.0–34.0)
MCHC: 32.5 g/dL (ref 30.0–36.0)
MCV: 91.5 fL (ref 80.0–100.0)
Platelets: 203 10*3/uL (ref 150–400)
RBC: 4.13 MIL/uL — ABNORMAL LOW (ref 4.22–5.81)
RDW: 15.3 % (ref 11.5–15.5)
WBC: 3.6 10*3/uL — ABNORMAL LOW (ref 4.0–10.5)
nRBC: 0 % (ref 0.0–0.2)

## 2024-02-10 LAB — BASIC METABOLIC PANEL WITH GFR
Anion gap: 7 (ref 5–15)
BUN: 27 mg/dL — ABNORMAL HIGH (ref 8–23)
CO2: 21 mmol/L — ABNORMAL LOW (ref 22–32)
Calcium: 8.6 mg/dL — ABNORMAL LOW (ref 8.9–10.3)
Chloride: 110 mmol/L (ref 98–111)
Creatinine, Ser: 2.26 mg/dL — ABNORMAL HIGH (ref 0.61–1.24)
GFR, Estimated: 30 mL/min — ABNORMAL LOW (ref >60)
Glucose, Bld: 105 mg/dL — ABNORMAL HIGH (ref 70–99)
Potassium: 4.2 mmol/L (ref 3.5–5.1)
Sodium: 138 mmol/L (ref 135–145)

## 2024-02-10 LAB — LACTATE DEHYDROGENASE: LDH: 158 U/L (ref 98–192)

## 2024-02-10 LAB — PROTIME-INR
INR: 2.4 — ABNORMAL HIGH (ref 0.8–1.2)
Prothrombin Time: 26.3 s — ABNORMAL HIGH (ref 11.4–15.2)

## 2024-02-10 MED ORDER — PANTOPRAZOLE SODIUM 40 MG PO TBEC
40.0000 mg | DELAYED_RELEASE_TABLET | Freq: Every day | ORAL | Status: DC
  Administered 2024-02-11 – 2024-02-18 (×7): 40 mg via ORAL
  Filled 2024-02-10 (×8): qty 1

## 2024-02-10 MED ORDER — ENSURE ENLIVE PO LIQD: 237.0000 mL | Freq: Two times a day (BID) | ORAL | Status: DC

## 2024-02-10 MED ORDER — MAGNESIUM OXIDE -MG SUPPLEMENT 400 (240 MG) MG PO TABS: 400.0000 mg | ORAL_TABLET | Freq: Every day | ORAL | Status: DC

## 2024-02-10 MED ORDER — POLYETHYLENE GLYCOL 3350 17 G PO PACK: 17.0000 g | PACK | Freq: Every day | ORAL | Status: DC

## 2024-02-10 MED ORDER — SENNOSIDES-DOCUSATE SODIUM 8.6-50 MG PO TABS
2.0000 | ORAL_TABLET | Freq: Two times a day (BID) | ORAL | Status: DC
  Administered 2024-02-10 – 2024-02-13 (×4): 2 via ORAL
  Filled 2024-02-10 (×9): qty 2

## 2024-02-10 MED ORDER — ROSUVASTATIN CALCIUM 10 MG PO TABS: 10.0000 mg | ORAL_TABLET | Freq: Every day | ORAL | Status: DC

## 2024-02-10 MED ORDER — ENSURE ENLIVE PO LIQD
237.0000 mL | Freq: Two times a day (BID) | ORAL | Status: DC
  Administered 2024-02-12 – 2024-02-17 (×5): 237 mL via ORAL

## 2024-02-10 MED ORDER — ORAL CARE MOUTH RINSE: 15.0000 mL | OROMUCOSAL | Status: DC | PRN

## 2024-02-10 MED ORDER — SILDENAFIL CITRATE 20 MG PO TABS
20.0000 mg | ORAL_TABLET | Freq: Three times a day (TID) | ORAL | Status: DC
  Administered 2024-02-10 – 2024-02-18 (×22): 20 mg via ORAL
  Filled 2024-02-10 (×26): qty 1

## 2024-02-10 MED ORDER — MAGNESIUM OXIDE -MG SUPPLEMENT 400 (240 MG) MG PO TABS
400.0000 mg | ORAL_TABLET | Freq: Every day | ORAL | Status: DC
Start: 2024-02-11 — End: 2024-02-18
  Administered 2024-02-11 – 2024-02-18 (×8): 400 mg via ORAL
  Filled 2024-02-10 (×8): qty 1

## 2024-02-10 MED ORDER — ONDANSETRON HCL 4 MG PO TABS: 4.0000 mg | ORAL_TABLET | Freq: Three times a day (TID) | ORAL | Status: DC | PRN

## 2024-02-10 MED ORDER — WARFARIN SODIUM 4 MG PO TABS
4.0000 mg | ORAL_TABLET | Freq: Once | ORAL | Status: AC
  Administered 2024-02-10: 4 mg via ORAL
  Filled 2024-02-10: qty 1

## 2024-02-10 MED ORDER — FUROSEMIDE 40 MG PO TABS
40.0000 mg | ORAL_TABLET | ORAL | Status: DC
  Administered 2024-02-12 – 2024-02-14 (×2): 40 mg via ORAL
  Filled 2024-02-10 (×2): qty 1

## 2024-02-10 MED ORDER — POLYETHYLENE GLYCOL 3350 17 G PO PACK
17.0000 g | PACK | Freq: Every day | ORAL | Status: DC
  Administered 2024-02-11 – 2024-02-15 (×4): 17 g via ORAL
  Filled 2024-02-10 (×6): qty 1

## 2024-02-10 MED ORDER — ACETAMINOPHEN 325 MG PO TABS: 650.0000 mg | ORAL_TABLET | ORAL | Status: DC | PRN

## 2024-02-10 MED ORDER — WARFARIN - PHARMACIST DOSING INPATIENT: Freq: Every day | Status: DC

## 2024-02-10 MED ORDER — WARFARIN SODIUM 4 MG PO TABS: 4.0000 mg | ORAL_TABLET | Freq: Once | ORAL | Status: DC

## 2024-02-10 MED ORDER — ROSUVASTATIN CALCIUM 5 MG PO TABS
10.0000 mg | ORAL_TABLET | Freq: Every day | ORAL | Status: DC
  Administered 2024-02-10 – 2024-02-17 (×8): 10 mg via ORAL
  Filled 2024-02-10 (×8): qty 2

## 2024-02-10 MED ORDER — SENNOSIDES-DOCUSATE SODIUM 8.6-50 MG PO TABS: 2.0000 | ORAL_TABLET | Freq: Two times a day (BID) | ORAL | Status: DC

## 2024-02-10 MED ORDER — FUROSEMIDE 40 MG PO TABS: 40.0000 mg | ORAL_TABLET | ORAL | Status: DC

## 2024-02-10 NOTE — Discharge Summary (Signed)
 Physician Discharge Summary  Patient ID: [name] MRN: [id number] DOB/AGE: 72-Sep-1952 72 y.o.  Admit date: 02/10/2024 Discharge date: 02/23/2024  Discharge Diagnoses:  Principal Problem:   Acute left PCA stroke [facility]) Active Problems:   LVAD (left ventricular assist device) present (HCC)   Cardioembolic stroke (HCC) CVA prophylaxis Atrial fibrillation/VT and chronic systolic congestive heart failure/LVAD CKD stage III Remote tobacco use Hyperlipidemia Severe constipation  Discharged Condition: Stable  Significant Diagnostic Studies: EP PPM/ICD IMPLANT Result Date: 02/09/2024 Conclusion: Successful removal of a previously implanted dual-chamber ICD and insertion of new dual-chamber ICD in a patient with chronic systolic heart failure status post left ventricular assist device. Manya Sells, MD   ECHOCARDIOGRAM COMPLETE Result Date: 02/06/2024    ECHOCARDIOGRAM REPORT   Patient Name:   Isaac [name] Date of Exam: 02/06/2024 Medical Rec #:  [id number]           Height:       69.0 in Accession #:    [id number]          Weight:       183.2 lb Date of Birth:  [birth year]            BSA:          1.990 m Patient Age:    72 years            BP:           104/82 mmHg Patient Gender: M                   HR:           50 bpm. Exam Location:  Inpatient Procedure: 2D Echo (Both Spectral and Color Flow Doppler were utilized during            procedure). Indications:    stroke  History:        Patient has prior history of Echocardiogram examinations, most                 recent 04/15/2023. Cardiomyopathy and CHF, CAD; LVAD and                 Defibrillator.  Sonographer:    Dione Franks RDCS Referring Phys: [id number] Koren Plyler R BENSIMHON IMPRESSIONS  1. LVAD at apex. Left ventricular ejection fraction, by estimation, is 20 to 25%. The left ventricle has severely decreased function. The left ventricle demonstrates global hypokinesis. The left ventricular internal cavity size was mildly dilated. There   is mild left ventricular hypertrophy. Left ventricular diastolic parameters are indeterminate.  2. Right ventricular systolic function is mildly reduced. The right ventricular size is normal. There is normal pulmonary artery systolic pressure. The estimated right ventricular systolic pressure is 22.6 mmHg.  3. Left atrial size was moderately dilated.  4. The mitral valve is normal in structure. No evidence of mitral valve regurgitation. No evidence of mitral stenosis.  5. The aortic valve is abnormal. There is mild calcification of the aortic valve. There is mild thickening of the aortic valve. Aortic valve regurgitation is moderate. No aortic stenosis is present.  6. The inferior vena cava is normal in size with greater than 50% respiratory variability, suggesting right atrial pressure of 3 mmHg. Conclusion(s)/Recommendation(s): No intracardiac source of embolism detected on this transthoracic study. Consider a transesophageal echocardiogram to exclude cardiac source of embolism if clinically indicated. FINDINGS  Left Ventricle: LVAD at apex. Left ventricular ejection fraction, by estimation, is 20 to 25%. The left ventricle has severely  decreased function. The left ventricle demonstrates global hypokinesis. The left ventricular internal cavity size was mildly dilated. There is mild left ventricular hypertrophy. Left ventricular diastolic parameters are indeterminate. Right Ventricle: The right ventricular size is normal. No increase in right ventricular wall thickness. Right ventricular systolic function is mildly reduced. There is normal pulmonary artery systolic pressure. The tricuspid regurgitant velocity is 1.91 m/s, and with an assumed right atrial pressure of 8 mmHg, the estimated right ventricular systolic pressure is 22.6 mmHg. Left Atrium: Left atrial size was moderately dilated. Right Atrium: Right atrial size was normal in size. Pericardium: There is no evidence of pericardial effusion. Mitral Valve:  The mitral valve is normal in structure. No evidence of mitral valve regurgitation. No evidence of mitral valve stenosis. Tricuspid Valve: The tricuspid valve is normal in structure. Tricuspid valve regurgitation is not demonstrated. No evidence of tricuspid stenosis. Aortic Valve: The aortic valve is abnormal. There is mild calcification of the aortic valve. There is mild thickening of the aortic valve. Aortic valve regurgitation is moderate. No aortic stenosis is present. Pulmonic Valve: The pulmonic valve was normal in structure. Pulmonic valve regurgitation is trivial. No evidence of pulmonic stenosis. Aorta: The aortic root is normal in size and structure. Venous: The inferior vena cava is normal in size with greater than 50% respiratory variability, suggesting right atrial pressure of 3 mmHg. IAS/Shunts: No atrial level shunt detected by color flow Doppler. Additional Comments: A device lead is visualized in the right ventricle.  LEFT VENTRICLE PLAX 2D LVIDd:         5.50 cm LVIDs:         5.00 cm LV PW:         1.00 cm LV IVS:        1.20 cm LVOT diam:     2.20 cm LVOT Area:     3.80 cm  RIGHT VENTRICLE            IVC RV S prime:     5.22 cm/s  IVC diam: 2.10 cm LEFT ATRIUM           Index LA diam:      4.60 cm 2.31 cm/m LA Vol (A4C): 95.9 ml 48.18 ml/m   AORTA Ao Root diam: 3.40 cm TRICUSPID VALVE TR Peak grad:   14.6 mmHg TR Vmax:        191.00 cm/s  SHUNTS Systemic Diam: 2.20 cm Dorothye Gathers MD Electronically signed by Dorothye Gathers MD Signature Date/Time: 02/06/2024/12:17:12 PM    Final    CT HEAD WO CONTRAST ( ) Result Date: 02/06/2024 CLINICAL DATA:  73 year old male with altered mental status, left PCA and cerebellar infarcts. EXAM: CT HEAD WITHOUT CONTRAST TECHNIQUE: Contiguous axial images were obtained from the base of the skull through the vertex without intravenous contrast. RADIATION DOSE REDUCTION: This exam was performed according to the departmental dose-optimization program which includes  automated exposure control, adjustment of the mA and/or kV according to patient size and/or use of iterative reconstruction technique. COMPARISON:  CT head, CTP, CTA head and neck yesterday. FINDINGS: Brain: Mid left cerebellar hemisphere cytotoxic edema is more apparent on series 3, image 7. But no associated hemorrhage. No posterior fossa mass effect. Other posterior fossa gray-white differentiation is stable. Left PCA territory confluent cytotoxic edema appears somewhat more stable. Also perhaps mildly more conspicuous on coronal images now. No hemorrhage or mass effect. Patchy cytotoxic edema also now visible in the right middle frontal gyrus, and this corresponds to CTP T-max  abnormality yesterday. See series 3, image 23. No hemorrhage or mass effect. Chronic left caudate lacunar infarct. Age indeterminate heterogeneity in the left thalamus appears stable. And stable gray-white differentiation elsewhere. Normal basilar cisterns. No ventriculomegaly or intracranial mass effect. Vascular: Calcified atherosclerosis at the skull base. No suspicious intracranial vascular hyperdensity. Skull: Stable and intact.  No acute osseous abnormality identified. Sinuses/Orbits: Visualized paranasal sinuses and mastoids are stable and well aerated. Other: Mild right supraorbital forehead soft tissue injury appears regressed. Negative orbits soft tissues. IMPRESSION: 1. Expected evolution of Left cerebellar and Left PCA infarcts since yesterday. No hemorrhage or mass effect. 2. Evolution also of a Right MCA middle frontal gyrus infarct predicted by abnormal CTP T-max yesterday. No hemorrhage or mass effect. 3. Consider a recent Embolic event given the recent anterior and posterior vascular territory infarcts. Electronically Signed   By: Marlise Simpers M.D.   On: 02/06/2024 09:17   CT ANGIO HEAD NECK W WO CM W PERF (CODE STROKE) Result Date: 02/05/2024 CLINICAL DATA:  73 year old male with altered mental status and recent appearing  left PCA and cerebellar infarcts on plain head CT this morning. EXAM: CT ANGIOGRAPHY HEAD AND NECK CT PERFUSION BRAIN TECHNIQUE: Multidetector CT imaging of the head and neck was performed using the standard protocol during bolus administration of intravenous contrast. Multiplanar CT image reconstructions and MIPs were obtained to evaluate the vascular anatomy. Carotid stenosis measurements (when applicable) are obtained utilizing NASCET criteria, using the distal internal carotid diameter as the denominator. Multiphase CT imaging of the brain was performed following IV bolus contrast injection. Subsequent parametric perfusion maps were calculated using RAPID software. RADIATION DOSE REDUCTION: This exam was performed according to the departmental dose-optimization program which includes automated exposure control, adjustment of the mA and/or kV according to patient size and/or use of iterative reconstruction technique. CONTRAST:  OMNIPAQUE  IOHEXOL  350 MG/ML SOLN COMPARISON:  Plain head CT 1121 hours. FINDINGS: CT Brain Perfusion Findings: ASPECTS: Not applicable, posterior circulation involvement. CBF (<30%) Volume: 0mL. No CBF parameter abnormality. Minimal CBV abnormality at the left occipital pole. Perfusion (Tmax>6.0s) volume: 41mL, corresponding not only to the broad-based left PCA area of involvement but also anterior right MCA division. Mismatch Volume: Questionably only in the anterior right MCA division, see CTA findings below. Infarction Location:Largest left PCA. CTA NECK Skeleton: Previous sternotomy. Absent mandible dentition. Mild for age cervical spine degeneration. No acute osseous abnormality identified. Upper chest: Left chest cardiac pacemaker. Sequelae of CABG. Some evidence of upper lung centrilobular emphysema. Visible superior mediastinal lymph nodes within normal limits. Other neck: Nonvascular neck soft tissue spaces are within normal limits. Aortic arch: Calcified aortic  atherosclerosis.  3 vessel arch. Right carotid system: Patent with widespread but mild soft plaque from the brachiocephalic artery through the ICA. Mild additional calcified plaque at the right ICA origin or bulb. No hemodynamically significant stenosis. Left carotid system: Similar widespread but generally mild soft plaque. Mild to moderate soft and calcified plaque at the distal bulb. No significant stenosis to the skull base. Vertebral arteries: Proximal right subclavian artery is patent with mild atherosclerosis. Calcified plaque at the right vertebral artery origin without stenosis. Paravertebral venous contrast contamination but the right vertebral artery appears normal to the skull base. Proximal left subclavian soft and calcified plaque without stenosis. Plaque at the left vertebral artery origin but no convincing stenosis. Paravertebral venous contrast contamination is greater on this side but the visible left vertebral artery is patent and normal to the skull base. Codominant CTA  HEAD Posterior circulation: Codominant distal vertebral arteries and vertebrobasilar junction are patent without plaque or stenosis. Mildly fenestrated vertebrobasilar junction, fenestrated vertebrobasilar junction is a normal variant. Bilateral PICA appear diminutive, AICA probably dominant. But the left PICA is patent. Patent basilar artery with mild irregularity. Patent SCA and PCA origins. Right PCA branches are within normal limits. Posterior communicating arteries are diminutive or absent. Left PCA is patent until the P2/P3 junction, where there is near occlusion but there is some faint distal PCA branch enhancement. See series 12, image 25. Anterior circulation: Both ICA siphons are patent. Left siphon calcified cavernous segment atherosclerosis with moderate stenosis series 8, image 87. Contralateral right siphon similar distal cavernous and anterior genu calcified plaque with moderate to severe stenosis on series 8, image  81. Additional mild to moderate right supraclinoid stenosis. Patent carotid termini, MCA and ACA origins. Anterior communicating artery, bilateral ACA branches are within normal limits. Left MCA M1 segment and trifurcation are patent without stenosis. Right MCA M1 segment and trifurcation are patent without stenosis. Bilateral MCA branches are within normal limits. No anterior right MCA division branch occlusion is identified. Venous sinuses: Early contrast timing, grossly patent. Anatomic variants: Fenestrated vertebrobasilar junction. Review of the MIP images confirms the above findings IMPRESSION: 1. CTP does not detect the known Left PCA infarct core (although T-max oligemia corresponds to that finding), while CTA demonstrates subtotal occlusion of the Left PCA at the P2/P3 junction (some distal flow). 2. CTP also suggests Oligemia in the anterior Right MCA division, although no MCA branch occlusion identified by CTA. 3. The above were discussed by telephone with Dr. Janett Medin on 02/05/2024 at 12:36 . 4. Additionally, Moderate to Severe bilateral ICA siphon atherosclerosis with at least Moderate siphon stenosis bilaterally (Moderate to Severe at the Right anterior genu). 5. Extracranial atherosclerosis but no significant Vertebral artery or extracranial carotid stenosis. 6. Aortic Atherosclerosis (ICD10-I70.0) and Emphysema (ICD10-J43.9). Electronically Signed   By: Marlise Simpers M.D.   On: 02/05/2024 12:38   DG Chest Portable 1 View Result Date: 02/05/2024 CLINICAL DATA:  Altered level of consciousness, emesis EXAM: PORTABLE CHEST 1 VIEW COMPARISON:  02/02/2024 FINDINGS: Single frontal view of the chest demonstrates stable left ventricular assist device and multi lead pacer/AICD. Cardiac silhouette remains enlarged. No acute airspace disease, effusion, or pneumothorax. No acute bony abnormalities. IMPRESSION: 1. Stable support devices. 2. No acute intrathoracic process. Electronically Signed   By: Bobbye Burrow M.D.    On: 02/05/2024 11:57   CT HEAD WO CONTRAST ( ) Addendum Date: 02/05/2024 ADDENDUM REPORT: 02/05/2024 11:54 ADDENDUM: Study discussed by telephone with Dr. Paris Bolds on 02/05/2024 1150 hours. Electronically Signed   By: Marlise Simpers M.D.   On: 02/05/2024 11:54   Result Date: 02/05/2024 CLINICAL DATA:  74 year old male with altered mental status. Vomiting and confusion. EXAM: CT HEAD WITHOUT CONTRAST TECHNIQUE: Contiguous axial images were obtained from the base of the skull through the vertex without intravenous contrast. RADIATION DOSE REDUCTION: This exam was performed according to the departmental dose-optimization program which includes automated exposure control, adjustment of the mA and/or kV according to patient size and/or use of iterative reconstruction technique. COMPARISON:  None Available. FINDINGS: Brain: No midline shift, mass effect, or evidence of intracranial mass lesion. No ventriculomegaly. No acute intracranial hemorrhage identified. Small but circumscribed, chronic appearing lacunar infarcts in the left caudate, right lentiform, left thalamus. Patchy additional moderate bilateral cerebral white matter hypodensity, asymmetric. Indistinct linear, wedge-shaped hypodensity in the left cerebellar hemisphere series 2, image 8  and series 4, image 15. other posterior fossa gray-white differentiation is within normal limits. No posterior fossa mass effect. Furthermore, there is similar indistinct cytotoxic edema in the left PCA territory, much of the medial left occipital lobe is affected. No associated hemorrhage or mass effect. Vascular: Questionable asymmetric hyperdensity of the distal left vertebral artery. Asymmetric hyperdense left PCA. Skull: Intact.  No acute osseous abnormality identified. Sinuses/Orbits: Mild to moderate bilateral paranasal sinus mucosal thickening. No layering sinus fluid. Tympanic cavities and mastoids well aerated. Other: Right supraorbital, forehead soft tissue  swelling and trace soft tissue gas suggesting injury on series 3, image 18. Underlying right frontal bone intact, hypoplastic right frontal sinus. Other visible scalp and orbits soft tissues are within normal limits. IMPRESSION: 1. 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 Hyperdense Left PCA suggests occlusion of that vessel. No hemorrhagic transformation or mass effect. 2. Underlying advanced chronic small vessel disease. 3. Right supraorbital/forehead recent soft tissue injury with no skull fracture identified. Electronically Signed: By: Marlise Simpers M.D. On: 02/05/2024 11:41   DG Chest Port 1 View Result Date: 02/02/2024 CLINICAL DATA:  Defibrillator activation EXAM: PORTABLE CHEST 1 VIEW COMPARISON:  07/02/2022 FINDINGS: Single frontal view of the chest demonstrates left ventricular cyst device overlying the cardiac apex. Cardiac silhouette is enlarged. Dual lead pacer/AICD in stable position. No airspace disease, effusion, or pneumothorax. No acute bony abnormalities. IMPRESSION: 1. Stable support devices. 2. No acute intrathoracic process. Electronically Signed   By: Bobbye Burrow M.D.   On: 02/02/2024 21:11    Labs:  Basic Metabolic Panel: Recent Labs  Lab 02/05/24 1106 02/05/24 1200 02/06/24 0334 02/07/24 0344 02/08/24 0249 02/09/24 0240 02/10/24 0229  NA 138 141 136 141 136 137 138  K 4.2 4.3 4.3 4.2 4.3 4.4 4.2  CL 108 108 108 107 108 107 110  CO2 21*  --  20* 20* 18* 20* 21*  GLUCOSE 106* 101* 96 81 92 100* 105*  BUN 42* 40* 36* 33* 30* 29* 27*  CREATININE 2.48* 2.60* 2.40* 2.33* 2.30* 2.17* 2.26*  CALCIUM  9.3  --  8.7* 9.1 8.6* 8.9 8.6*  MG 2.0  --   --   --   --   --   --     CBC: Recent Labs  Lab 02/05/24 1106 02/05/24 1200 02/08/24 0249 02/09/24 0240 02/10/24 0229  WBC 6.2   < > 4.5 3.8* 3.6*  NEUTROABS 4.5  --   --   --   --   HGB 13.1   < > 12.5* 13.0 12.3*  HCT 40.8   < > 38.9* 39.9 37.8*  MCV 92.1   < > 91.3 91.7 91.5  PLT  171   < > 183 178 203   < > = values in this interval not displayed.    CBG: Recent Labs  Lab 02/06/24 0828 02/06/24 1620 02/06/24 2106 02/06/24 2349 02/07/24 0423  GLUCAP 110* 89 96 83 77   Family history.  Mother and father with hypertension.  Father with hyperlipidemia.  Denies any colon cancer esophageal cancer or rectal cancer  rief HPI:   Isaac [name] is a 73 y.o. right-handed male with history significant for CAD, hypertension, atrial fibrillation, CKD stage IV, tobacco use, VT and chronic systolic congestive heart failure status post HM-3 LVAD 08/28/2021 maintained on Coumadin  therapy.  Per chart review lives with spouse.  Independent prior to admission.  Presented 02/05/2024 when his pacemaker went off abruptly resulting in  a fall as well as noted right sided weakness and aphasia with blurred vision.  Cranial CT scan positive for acute to subacute appearing left PCA left cerebellar infarction.  Hyperdense left PCA suggesting occlusion of that vessel.  He was also diagnosed with right frontal gyrus MCA infarction.  No hemorrhagic transformation or mass effect.  Underlying advanced chronic small vessel disease.  Right supraorbital forehead recent soft tissue injury with no skull fracture identified.  CTA head and neck demonstrating subtotal occlusion of the left PCA in the P2/P3 junction and additionally moderate to severe bilateral ICA siphon atherosclerosis with a least moderate siphon stenosis bilaterally.  Admission chemistries unremarkable except BUN 42 creatinine 2.48 INR 2.2 troponin 133 BNP 343.  Follow-up CT of the head 02/06/2024 expected evolution of left cerebellar and left PCA infarction no hemorrhage or mass effect.  Evolution also of right MCA middle frontal gyrus infarction.  Echocardiogram with ejection fraction of 20 to 25%.  Left ventricle demonstrating global hypokinesis.  Neurology follow-up maintained on Coumadin  therapy with goal INR 2.5-3.0.  Patient with  dual-chamber ICD in place status post generator change 02/09/2024.  Therapy evaluations completed due to patient decreased functional mobility right side weakness and aphasia was admitted for a comprehensive rehab program.   Hospital Course: Glenda Kunst was admitted to rehab 02/10/2024 for inpatient therapies to consist of PT, ST and OT at least three hours five days a week. Past admission physiatrist, therapy team and rehab RN have worked together to provide customized collaborative inpatient rehab.  Pertaining to patient's cardioembolic left cerebellar, PCA and right frontal MCA branch infarction secondary to LVAD device despite being on anticoagulation with warfarin.  Patient remained on Coumadin  therapy goal INR 2.5-3.0.  Atrial fibrillation/VT chronic systolic congestive heart failure status post LVAD 09/17/2021.  Continued Revatio  followed by heart failure team status post generator change 02/09/2024.  CKD stage III follow-up chemistries.  Remote tobacco use providing counsel regards to cessation of alcohol products.  Hyperlipidemia with Crestor  as advised.  Bouts of constipation resolved with laxative assistance.   Blood pressures were monitored on TID basis and remained controlled and monitored     Rehab course: During patient's stay in rehab weekly team conferences were held to monitor patient's progress, set goals and discuss barriers to discharge. At admission, patient required moderate assist 100 feet rolling walker minimal assist sit to stand  Physical exam.  Blood pressure 90/77 pulse 103 temperature 98 respirations 14 oxygen saturation 93% room air Constitutional.  No acute distress HEENT Head.  Mild right facial droop improved with smile. Eyes.  Pupils round reactive to light no discharge without nystagmus Neck.  Supple nontender no JVD without thyromegaly Cardiac LVAD hum heard throughout Abdomen.  Soft nontender positive bowel sounds without rebound Respiratory effort  normal no respiratory distress without wheeze Musculoskeletal.  Right upper extremity 4/5 throughout however right side neglect hard to assess Left upper extremity 5/5 except FA 4+/5 as well as grip 5 -/5 Right lower extremity hip flexor 2/5 KE/KF 4 -/5 DF 4 -/5 and PF 4/5 Left lower extremity 5/5 Neurologic.  Alert mild left gaze preference.  Follows commands.  He does answer name and place appropriately with some word hesitation following commands   He/She  has had improvement in activity tolerance, balance, postural control as well as ability to compensate for deficits. He/She has had improvement in functional use RUE/LUE  and RLE/LLE as well as improvement in awareness.  Perform supine to sit with supervision.  Able  to don pants and T-shirt with supervision.  Ambulates 200 feet x 2 using no assistive device overall light contact-guard supervision.  Perform stairs bilateral handrails close supervision contact-guard.  Contact-guard sit to stand close supervision for urinal use.  Minimal assist for donning shoulder strap and LVAD monitor contact-guard assist stand pivot to wheelchair.  SLP targeted scanning and working memory primarily utilizing room to schedule an advertisement task.  Full family teaching completed plan discharge to home       Disposition:  There are no questions and answers to display.         Diet: Regular  Special Instructions: No driving smoking or alcohol  Follow-up [place] [facility]  Medications at discharge. 1.  Tylenol  as needed 2.  Lasix  40 mg p.o. daily 3.  Magnesium  oxide 400 mg p.o. daily 4.  Protonix  40 mg p.o. daily 5.  MiraLAX  daily hold for loose stools 6.  Crestor  10 mg p.o. nightly 7.  Revatio  20 mg 3 times daily 8.  Coumadin  daily goal INR 2.5-3.5  30-35 minutes were spent completing discharge summary and discharge planning  Discharge Instructions     Ambulatory referral to Neurology   Complete by: As  directed    An appointment is requested in approximately: 4 weeks cardioembolic infarction        Follow-up Information     Lylia Sand, MD Follow up.   Specialty: Physical Medicine and Rehabilitation Why: Office to call for appointment Contact information: [address] [place] [address] [phone]         [facility] .   Specialty: Rehabilitation Contact information: [address] [place] [place]  [address] [phone]        Verona Goodwill, MD Follow up.   Specialty: Cardiology Why: Call for appointment Contact information: [address] [place] [address] [phone]                 Signed: Sterling Eisenmenger 02/10/2024, 7:29 PM

## 2024-02-10 NOTE — TOC Transition Note (Signed)
 Transition of Care [facility]) - Discharge Note   Patient Details  Name: Isaac [name] MRN: [id number] Date of Birth: Jan 18, 1951  Transition of Care [facility]) CM/SW Contact:  Benjiman Bras, RN Phone Number:[phone] 02/10/2024, 9:47 AM   Clinical Narrative:     CIR has bed available for IP rehab today, attending updated.   Final next level of care: IP Rehab Facility Barriers to Discharge: No Barriers Identified   Patient Goals and CMS Choice   CMS Medicare.gov Compare Post Acute Care list provided to:: Patient Represenative (must comment) Choice offered to / list presented to : Spouse Gem ownership interest in [facility].provided to:: Spouse    Discharge Placement                       Discharge Plan and Services Additional resources added to the After Visit Summary for       Post Acute Care Choice: Skilled Nursing Facility                               Social Drivers of Health (SDOH) Interventions SDOH Screenings   Food Insecurity: No Food Insecurity (02/05/2024)  Housing: Low Risk  (02/05/2024)  Transportation Needs: No Transportation Needs (02/05/2024)  Utilities: Not At Risk (02/05/2024)  Depression (PHQ2-9): Low Risk  (04/14/2022)  Social Connections: Moderately Isolated (02/05/2024)  Tobacco Use: Medium Risk (02/05/2024)     Readmission Risk Interventions     No data to display

## 2024-02-10 NOTE — Discharge Summary (Signed)
 Advanced Heart Failure Team  Discharge Summary   Patient ID: Isaac [name] MRN: [id number], DOB/AGE: 72/11/1950 72 y.o. Admit date: 02/05/2024 D/C date:     02/10/2024   Primary Discharge Diagnoses:  Acute CVA  Secondary Discharge Diagnoses:  Chronic Systolic CHF H/o TR Chronic Atrial fibrillation CKD stage IV CAD  Hospital Course:   Isaac [name] is a 73 y.o. male with a history of VT, NICM, s/p ICD, LVAD, afib, CKD IV, previously requiring HD, and CAD s/p PCI.  Presented to [facility] 02/05/24 with code stroke by EMS with R sided weakness and aphasia. Found to have acute CVA to L PCA, L cerebral and R MCA stoke, without hemorrhagic transformation. He was admitted to the ICU. Echo performed showing mildly reduced RV function, without sources of embolization. Of note, found by wife with coffee-ground emesis prior to EMS arrival, warfarin was continued, he was started on PPI, hgb remained stable. Also during admission, he underwent ICD generator changeout 5/21 by Dr. Carolynne Citron for end of life ICD. He has continued to work with PT/OT with improvement of weakness and aphasia.    Seen today by Dr. Alease Amend and deemed approprate for discharge to CIR.   Hospital Course by Problem List:  1. Acute CVA: ?embolic - CT brain 5/17: + for acute to subacute L PCA and Left cerebellar infarcts. L PCA occlusion. No hemorrhagic transformation or mass effect.  - CT brain 5/18: evolution of L PCA, L cerebellar and R MCA strokes. No hemorrhage - Echo w/o clear source of embolization - Goal INR increased 2.5-3.0. INR 2.4 at discharge. Warfarin 4 mg tonight - weakness and aphasia slowly improving   2. Chronic systolic CHF:   - Long-standing cardiomyopathy. [employer] ICD s/p gen change 5/21. S/p HM-3 VAD 09/17/21.  - NYHA I at baseline - VAD interrogated personally. Parameters stable at discharge.  - Euvolemic. Continue PO Lasix  40 mg EOD - Continue sildenafil  20 mg daily for RV dysfunction   3.  Tricuspid regurgitation:   - Tricuspid repair not done at time of VAD due to proximity of ICD wires and hypotension during surgery.  TR normal on echo this admission.    4. Chronic Atrial fibrillation: - off amiodarone  with resolution of nausea. - ICD dependent   5. CKD stage IV:  - baseline Scr ~2.5.  - Scr stable 2.26 at discharge - Follows with nephrology.    6. CAD: History of PCI to OM1 in 2007 and RCA in 2013.   - continue statin   7. GERD with coffee ground emesis - continue PPI  Discharge Weight: 178 lbs Discharge Vitals: Blood pressure 90/77, pulse (!) 103, temperature 98 F (36.7 C), temperature source Oral, resp. rate 14, height 5\' 9"  (1.753 m), weight 80.7 kg, SpO2 93%.  Labs: Lab Results  Component Value Date   WBC 3.6 (L) 02/10/2024   HGB 12.3 (L) 02/10/2024   HCT 37.8 (L) 02/10/2024   MCV 91.5 02/10/2024   PLT 203 02/10/2024    Recent Labs  Lab 02/05/24 1106 02/05/24 1200 02/10/24 0229  NA 138   < > 138  K 4.2   < > 4.2  CL 108   < > 110  CO2 21*   < > 21*  BUN 42*   < > 27*  CREATININE 2.48*   < > 2.26*  CALCIUM  9.3   < > 8.6*  PROT 6.8  --   --   BILITOT 1.5*  --   --  ALKPHOS 92  --   --   ALT 21  --   --   AST 26  --   --   GLUCOSE 106*   < > 105*   < > = values in this interval not displayed.   Lab Results  Component Value Date   CHOL 118 02/06/2024   HDL 39 (L) 02/06/2024   LDLCALC 73 02/06/2024   TRIG 32 02/06/2024   BNP (last 3 results) Recent Labs    02/05/24 1106  BNP 343.8*    ProBNP (last 3 results) No results for input(s): "PROBNP" in the last 8760 hours.   Diagnostic Studies/Procedures   EP PPM/ICD IMPLANT Result Date: 02/09/2024 Conclusion: Successful removal of a previously implanted dual-chamber ICD and insertion of new dual-chamber ICD in a patient with chronic systolic heart failure status post left ventricular assist device. Manya Sells, MD    Discharge Medications   Allergies as of 02/10/2024        Reactions   Mushroom Extract Complex (obsolete) Nausea And Vomiting   Doxycycline  Nausea And Vomiting   Neosporin [neomycin-bacitracin Zn-polymyx] Hives   Tape Other (See Comments)   Some tapes/dressings can irritate the skin        Medication List     STOP taking these medications    cefadroxil  500 MG capsule Commonly known as: DURICEF   EMERGEN-C IMMUNE PO   magnesium  oxide 400 MG tablet Commonly known as: MAG-OX Replaced by: magnesium  oxide 400 (240 Mg) MG tablet   potassium chloride  SA 20 MEQ tablet Commonly known as: KLOR-CON  M       TAKE these medications    feeding supplement Liqd Take 237 mLs by mouth 2 (two) times daily between meals.   furosemide  40 MG tablet Commonly known as: LASIX  Take 1 tablet (40 mg total) by mouth every other day. Start taking on: Feb 12, 2024 What changed:  medication strength when to take this   magnesium  oxide 400 (240 Mg) MG tablet Commonly known as: MAG-OX Take 1 tablet (400 mg total) by mouth daily. Start taking on: Feb 11, 2024 Replaces: magnesium  oxide 400 MG tablet   pantoprazole  40 MG tablet Commonly known as: PROTONIX  TAKE 1 TABLET BY MOUTH EVERY DAY   polyethylene glycol 17 g packet Commonly known as: MIRALAX  / GLYCOLAX  Take 17 g by mouth daily. Start taking on: Feb 11, 2024   rosuvastatin  10 MG tablet Commonly known as: CRESTOR  Take 1 tablet (10 mg total) by mouth at bedtime. What changed: when to take this   senna-docusate 8.6-50 MG tablet Commonly known as: Senokot-S Take 2 tablets by mouth 2 (two) times daily.   sildenafil  20 MG tablet Commonly known as: REVATIO  Take 1 tablet (20 mg total) by mouth 3 (three) times daily.   warfarin 4 MG tablet Commonly known as: COUMADIN  Take as directed. If you are unsure how to take this medication, talk to your nurse or doctor. Original instructions: Take 1 tablet (4 mg total) by mouth one time only at 4 PM. What changed:  medication strength how much to  take how to take this when to take this additional instructions        Disposition   The patient will be discharged in stable condition to home.     Duration of Discharge Encounter: 20 Time   [place] Amarii Bordas, NP 02/10/2024, 10:04 AM

## 2024-02-10 NOTE — Progress Notes (Signed)
 Advanced Heart Failure VAD Team Note  PCP-Cardiologist: Peder Bourdon, MD  Chief Complaint: CVA  Patient Profile   73 y/o chronic systolic heart failure s/p HM3 LVAD, CKD Stage IV, permanent atrial fibrillation admitted w/ acute CVA, likely embolic. ICD also noted to be at EOL.   Subjective:    S/p ICD generator changeout yesterday Plan for CIR, awaiting insurance auth  Sitting up in the chair, eating breakfast. Reports soreness at his ICD site. No SOB or swelling. Has been working with PT. Remains with mild expressive aphasia this morning  LVAD INTERROGATION:  HeartMate III LVAD:   Flow 4.6 liters/min, speed 5350, power 3.9, PI 2.9.    Objective:    Vital Signs:   Temp:  [97.3 F (36.3 C)-98.2 F (36.8 C)] 98 F (36.7 C) (05/22 0450) Pulse Rate:  [0-102] 96 (05/22 0450) Resp:  [14-20] 14 (05/22 0450) BP: (80-108)/(61-92) 101/70 (05/22 0450) SpO2:  [94 %-100 %] 95 % (05/22 0450) Weight:  [80.7 kg] 80.7 kg (05/22 0450) Last BM Date : 02/05/24  Doppler MAP: 80  Intake/Output:  Intake/Output Summary (Last 24 hours) at 02/10/2024 0719 Last data filed at 02/10/2024 0451 Gross per 24 hour  Intake 456.83 ml  Output 1050 ml  Net -593.17 ml    Physical Exam    General: Dazed appearing. No distress on RA Cardiac: JVP flat. Mechanical heart sounds with LVAD hum present.  Driveline: Dressing C/D/I. No drainage or redness. Anchor in place. Extremities: Warm and dry. No peripheral edema. Neuro: Affect pleasant. Expressive aphasia. Moves all extremities without difficulty.  Telemetry   AV paced at 60s (personally reviewed)  EKG    N/A   Labs   Basic Metabolic Panel: Recent Labs  Lab 02/05/24 1106 02/05/24 1200 02/06/24 0334 02/07/24 0344 02/08/24 0249 02/09/24 0240 02/10/24 0229  NA 138   < > 136 141 136 137 138  K 4.2   < > 4.3 4.2 4.3 4.4 4.2  CL 108   < > 108 107 108 107 110  CO2 21*  --  20* 20* 18* 20* 21*  GLUCOSE 106*   < > 96 81 92 100* 105*  BUN  42*   < > 36* 33* 30* 29* 27*  CREATININE 2.48*   < > 2.40* 2.33* 2.30* 2.17* 2.26*  CALCIUM  9.3  --  8.7* 9.1 8.6* 8.9 8.6*  MG 2.0  --   --   --   --   --   --    < > = values in this interval not displayed.   Liver Function Tests: Recent Labs  Lab 02/05/24 1106  AST 26  ALT 21  ALKPHOS 92  BILITOT 1.5*  PROT 6.8  ALBUMIN  3.5   CBC: Recent Labs  Lab 02/05/24 1106 02/05/24 1200 02/06/24 0334 02/07/24 0344 02/08/24 0249 02/09/24 0240 02/10/24 0229  WBC 6.2  --  7.3 5.3 4.5 3.8* 3.6*  NEUTROABS 4.5  --   --   --   --   --   --   HGB 13.1   < > 11.6* 13.5 12.5* 13.0 12.3*  HCT 40.8   < > 35.2* 42.5 38.9* 39.9 37.8*  MCV 92.1  --  90.3 93.2 91.3 91.7 91.5  PLT 171  --  145* 164 183 178 203   < > = values in this interval not displayed.   INR: Recent Labs  Lab 02/06/24 0334 02/07/24 0344 02/08/24 0249 02/09/24 0240 02/10/24 0229  INR 2.6* 2.3* 2.7* 2.5*  2.4*   Imaging   EP PPM/ICD IMPLANT Result Date: 02/09/2024 Conclusion: Successful removal of a previously implanted dual-chamber ICD and insertion of new dual-chamber ICD in a patient with chronic systolic heart failure status post left ventricular assist device. Gregg Taylor, MD   Medications:    Scheduled Medications:  bisacodyl   5 mg Oral Once   Chlorhexidine  Gluconate Cloth  6 each Topical Daily   feeding supplement  237 mL Oral BID BM   furosemide   40 mg Oral QODAY   magnesium  oxide  400 mg Oral Daily   pantoprazole   40 mg Oral Daily   polyethylene glycol  17 g Oral Daily   rosuvastatin   10 mg Oral QHS   senna-docusate  2 tablet Oral BID   sildenafil   20 mg Oral TID   warfarin  4 mg Oral ONCE-1600   Warfarin - Pharmacist Dosing Inpatient   Does not apply q1600    Infusions:   PRN Medications: acetaminophen , HYDROcodone -acetaminophen , ondansetron  (ZOFRAN ) IV, ondansetron , mouth rinse, mouth rinse, prochlorperazine   Assessment/Plan:    1. Acute CVA - likely embolic - CT brain 5/17 positive  for acute to subacute appearing Left PCA and Left cerebellar infarcts. Hyperdense Left PCA suggests occlusion of that vessel. No hemorrhagic transformation or mass effect.  - f/u CT brain 5/18 evolution of L PCA, L cerebellar and R MCA strokes. No hemorrhage - Echo without clear source of embolization - Seen by Neuro. Goal INR increased 2.5-3.0. INR 2.4 today - Appreciate PT/OT. Plan for CIR, awaiting insurance auth  2. Chronic systolic CHF:  Long-standing cardiomyopathy.  [employer] ICD. S/p HM-3 VAD 09/17/21.  - NYHA I at baseline - VAD interrogated personally. Parameters stable.  - Volume status okay. Continue PO Lasix  40 mg every other day  - On sildenafil  20 mg daily for RV dysfunction - ICD s/p gen changeout 5/21. Possible recent ICD shock but unclear. ICD dependent. EP following.   3. Tricuspid regurgitation:  Tricuspid repair not done at time of VAD due to proximity of ICD wires and hypotension during surgery.  He has severe TR.    4. Permanent Atrial fibrillation: - Atrial fibrillation now chronic, he is off amiodarone  with resolution of nausea. - Unclear underlying rhythm, likely afib   5. CKD stage IV:  - baseline Scr ~2.5.  - Avoid contrast studies as much as possible - Scr stable 2.26 today - Could consider SGLT2i prior to discharge and transition lasix  to PRN - Follows with nephrology.    6. CAD: History of PCI to OM1 in 2007 and RCA in 2013.   - No s/s angina - continue statin   7. GERD with coffee ground emesis - Hgb stable - continue PPI  I reviewed the LVAD parameters from today, and compared the results to the patient's prior recorded data.  No programming changes were made.  The LVAD is functioning within specified parameters.  The patient performs LVAD self-test daily.  LVAD interrogation was negative for any significant power changes, alarms or PI events/speed drops.  LVAD equipment check completed and is in good working order.  Back-up equipment  present.   LVAD education done on emergency procedures and precautions and reviewed exit site care.  Length of Stay: 5  [place] Manpreet Kemmer, NP 02/10/2024, 7:19 AM  VAD Team --- VAD ISSUES ONLY--- Pager [phone] (7am - 7am)  Advanced Heart Failure Team  Pager [phone] (M-F; 7a - 5p)  Please contact [facility] for night-coverage after hours (5p -7a ) and  weekends on [email or web address]

## 2024-02-10 NOTE — Plan of Care (Signed)
 Problem: Education: Goal: Patient will understand all VAD equipment and how it functions Outcome: Progressing Goal: Patient will be able to verbalize current INR target range and antiplatelet therapy for discharge home Outcome: Progressing   Problem: Cardiac: Goal: LVAD will function as expected and patient will experience no clinical alarms Outcome: Progressing   Problem: Education: Goal: Knowledge of General Education information will improve Description: Including pain rating scale, medication(s)/side effects and non-pharmacologic comfort measures Outcome: Progressing   Problem: Health Behavior/Discharge Planning: Goal: Ability to manage health-related needs will improve Outcome: Progressing   Problem: Clinical Measurements: Goal: Ability to maintain clinical measurements within normal limits will improve Outcome: Progressing Goal: Will remain free from infection Outcome: Progressing Goal: Diagnostic test results will improve Outcome: Progressing Goal: Respiratory complications will improve Outcome: Progressing Goal: Cardiovascular complication will be avoided Outcome: Progressing   Problem: Activity: Goal: Risk for activity intolerance will decrease Outcome: Progressing   Problem: Nutrition: Goal: Adequate nutrition will be maintained Outcome: Progressing   Problem: Coping: Goal: Level of anxiety will decrease Outcome: Progressing   Problem: Elimination: Goal: Will not experience complications related to bowel motility Outcome: Progressing Goal: Will not experience complications related to urinary retention Outcome: Progressing   Problem: Pain Managment: Goal: General experience of comfort will improve and/or be controlled Outcome: Progressing   Problem: Safety: Goal: Ability to remain free from injury will improve Outcome: Progressing   Problem: Skin Integrity: Goal: Risk for impaired skin integrity will decrease Outcome: Progressing   Problem:  Education: Goal: Knowledge of disease or condition will improve Outcome: Progressing Goal: Knowledge of secondary prevention will improve (MUST DOCUMENT ALL) Outcome: Progressing Goal: Knowledge of patient specific risk factors will improve (DELETE if not current risk factor) Outcome: Progressing   Problem: Ischemic Stroke/TIA Tissue Perfusion: Goal: Complications of ischemic stroke/TIA will be minimized Outcome: Progressing   Problem: Coping: Goal: Will verbalize positive feelings about self Outcome: Progressing Goal: Will identify appropriate support needs Outcome: Progressing   Problem: Health Behavior/Discharge Planning: Goal: Ability to manage health-related needs will improve Outcome: Progressing Goal: Goals will be collaboratively established with patient/family Outcome: Progressing   Problem: Self-Care: Goal: Ability to participate in self-care as condition permits will improve Outcome: Progressing Goal: Verbalization of feelings and concerns over difficulty with self-care will improve Outcome: Progressing Goal: Ability to communicate needs accurately will improve Outcome: Progressing   Problem: Nutrition: Goal: Risk of aspiration will decrease Outcome: Progressing Goal: Dietary intake will improve Outcome: Progressing   Problem: Education: Goal: Knowledge of cardiac device and self-care will improve Outcome: Progressing Goal: Ability to safely manage health related needs after discharge will improve Outcome: Progressing Goal: Individualized Educational Video(s) Outcome: Progressing   Problem: Cardiac: Goal: Ability to achieve and maintain adequate cardiopulmonary perfusion will improve Outcome: Progressing   Problem: RH BOWEL ELIMINATION Goal: RH STG MANAGE BOWEL WITH ASSISTANCE Description: STG Manage Bowel with min Assistance. Outcome: Progressing Goal: RH STG MANAGE BOWEL W/MEDICATION W/ASSISTANCE Description: STG Manage Bowel with Medication with  min Assistance. Outcome: Progressing   Problem: RH BLADDER ELIMINATION Goal: RH STG MANAGE BLADDER WITH ASSISTANCE Description: STG Manage Bladder With min Assistance Outcome: Progressing   Problem: RH SKIN INTEGRITY Goal: RH STG SKIN FREE OF INFECTION/BREAKDOWN Outcome: Progressing   Problem: RH SAFETY Goal: RH STG ADHERE TO SAFETY PRECAUTIONS W/ASSISTANCE/DEVICE Description: STG Adhere to Safety Precautions With min Assistance/Device. Outcome: Progressing   Problem: RH PAIN MANAGEMENT Goal: RH STG PAIN MANAGED AT OR BELOW PT'S PAIN GOAL Description: Less than 3 Outcome: Progressing  Problem: RH KNOWLEDGE DEFICIT Goal: RH STG INCREASE KNOWLEDGE OF HYPERTENSION Description: Patient able to verbalize risks associated with hypertension and interventions to manage with cues Outcome: Progressing Goal: RH STG INCREASE KNOWLEDGE OF STROKE PROPHYLAXIS Description: Able to describe plan for stroke prophylaxis including medication and lifestyle modification Outcome: Progressing

## 2024-02-10 NOTE — Progress Notes (Signed)
 LVAD Coordinator Rounding Note:  Pt admitted 02/05/24 for altered mental status. Code Stroke activated. CT showed Left PCA and left cerebellar infarct. Pt not candidate for thrombolytic therapy or IR due to the occlusion being too distal.  STAT CT head 1. 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 Hyperdense Left PCA suggests occlusion of that vessel. No hemorrhagic transformation or mass effect. 2. Underlying advanced chronic small vessel disease. 3. Right supraorbital/forehead recent soft tissue injury with no skull fracture identified.  Pt sitting in chair on my arrival states he is feeling well this morning. Mild soreness at ICD incision site. Plan for to transfer to CIR today.  Goal INR now 2.5-3.0 per Neurology.  Vital signs: Temp: 98 HR: 60 V paced Doppler Pressure: 76 Auto Cuff BP: 89/78 (84) O2 Sat: 96% on RA Wt: 184>183.2>182.9>187.3>178.3>177.9 lbs    LVAD interrogation reveals:  Speed: 5300 Flow: 4.5 Power: 3.9 w PI: 3.1 Hematocrit: 38  Alarms: none Events: rare  Fixed speed: 5300 Low speed limit: 5000  Drive Line:  Existing VAD dressing C/D/I with anchor intact and accurately applied. Weekly dressing changes per VAD coordinator, or bedside nurse. Next dressing change due: 02/12/24 by bedside nurse or caregiver.  Labs:  LDH trend: 161>175>154>169>158  INR trend: 2.2>2.6>2.3>2.7>2.5>2.4  Anticoagulation Plan: -INR Goal: 2.5-3.0  Device: - Cabin crew - V paced 60 -Therapies: OFF  Infection:   Plan/Recommendations:  Page VAD coordinator for drive line or equipment concerns 2. Weekly dressing changes.  Laurice Pope RN,BSN VAD Coordinator  Office: [phone]  24/7 Pager: [phone]

## 2024-02-10 NOTE — Progress Notes (Signed)
 Inpatient Rehabilitation  Patient information reviewed and entered into eRehab system by [facility]. Karen Kays., CCC/SLP, PPS Coordinator.  Information including medical coding, functional ability and quality indicators will be reviewed and updated through discharge.

## 2024-02-10 NOTE — H&P (Signed)
 Physical Medicine and Rehabilitation Admission H&P        Chief Complaint  Patient presents with   Weakness  : HPI: Isaac [name] is a 73 year old right-handed male with history of CAD, hypertension, atrial fibrillation, CKD stage IV, tobacco use, VT and chronic systolic congestive heart failure status post HM-3 LVAD for  on 09/17/2021 maintained on Coumadin  therapy.  Per chart review patient lives with spouse.  Two-level home bed and bath upstairs with one-step to entry.  Independent prior to admission who walked with Rolling walker prior to admission.  Presented 02/05/2024 when his pacemaker went off abruptly resulting in a fall as well as noted right side weakness and aphasia and blurred vision.  Cranial CT scan positive for acute to subacute appearing left PCA and left cerebellar infarct.  Hyperdense left PCA suggesting occlusion of that vessel.  He was also diagnosed with R frontal gyrus MCA infarct.  No hemorrhagic transformation or mass effect.  Underlying advanced chronic small vessel disease.  Right supraorbital/forehead recent soft tissue injury with no skull fracture identified.  CTA head and neck demonstrates subtotal occlusion of the left PCA and the P2/P3 junction and additionally moderate to severe bilateral ICA siphon atherosclerosis with at least moderate siphon stenosis bilaterally.  Admission chemistries unremarkable except glucose 106 BUN 42 creatinine 2.48, INR 2.2, troponin 133, BNP 343.  Follow-up CT of the head 02/06/2024 expected evolution of left cerebellar and left PCA infarct no hemorrhage or mass effect.  Evolution also of a right MCA middle frontal gyrus infarct.  Echocardiogram with ejection fraction of 20 to 25%.  The left ventricle demonstrating global hypokinesis.  Neurology follow-up patient currently remains on Coumadin  therapy with goal INR 2.5-3.0.  With dual-chamber ICD in place status post generator change 02/09/2024.  Patient is tolerating a regular consistency  diet.  Therapy evaluations completed due to patient decreased functional mobility right side weakness and aphasia was admitted for a comprehensive rehab program.      Per Cardiology, they want him to keep his compression dressing for ACID "as long as possible" since on Coumadin .    Patient reports LBM 8-9 days ago- per wife and chart, this appears to be true. Usually goes every 1-2 days at home. Pt reports feeling constipated and needs to have BM.   Wife feels like he's slightly sharper, but still not able to tell me that Wife's Birthday was [birth year] when asked.    Pt specifically asking to shave his head- he did at home.      Review of Systems  Constitutional:  Negative for chills and fever.  HENT:  Negative for hearing loss.   Eyes:  Positive for blurred vision.  Respiratory:  Negative for cough, shortness of breath and wheezing.   Cardiovascular:  Positive for leg swelling. Negative for chest pain and palpitations.  Gastrointestinal:  Positive for constipation. Negative for heartburn, nausea and vomiting.  Genitourinary:  Negative for dysuria, flank pain and hematuria.  Musculoskeletal:  Positive for myalgias.  Skin:  Negative for rash.  Neurological:  Positive for speech change, focal weakness and weakness.  Psychiatric/Behavioral:  Positive for memory loss.        Wife reports flat, and poor memory  All other systems reviewed and are negative.       Past Medical History:  Diagnosis Date   Arrhythmia      atrial fibrillation   CHF (congestive heart failure) (HCC)     Chronic kidney disease  Coronary artery disease     Hyperlipidemia     Hypertension     Myocardial infarct [facility])               Past Surgical History:  Procedure Laterality Date   CARDIAC DEFIBRILLATOR PLACEMENT   feb 2014   ICD GENERATOR CHANGEOUT N/A 02/09/2024    Procedure: ICD GENERATOR CHANGEOUT;  Surgeon: Tammie Fall, MD;  Location: [facility] INVASIVE CV LAB;  Service: Cardiovascular;  Laterality:  N/A;   INSERTION OF IMPLANTABLE LEFT VENTRICULAR ASSIST DEVICE N/A 09/17/2021    Procedure: INSERTION OF IMPLANTABLE LEFT VENTRICULAR ASSIST DEVICE AND INSERTION OF FEMORAL ARTERIAL LINE;  Surgeon: Bartley Lightning, MD;  Location: [facility] OR;  Service: Open Heart Surgery;  Laterality: N/A;   IR FLUORO GUIDE CV LINE RIGHT   09/27/2021   IR THORACENTESIS ASP PLEURAL SPACE W/IMG GUIDE   09/27/2021   IR US  GUIDE VASC ACCESS RIGHT   09/27/2021   PLACEMENT OF IMPELLA LEFT VENTRICULAR ASSIST DEVICE N/A 09/12/2021    Procedure: PLACEMENT OF IMPELLA 5.5 LEFT VENTRICULAR ASSIST DEVICE;  Surgeon: Zelphia Higashi, MD;  Location: [facility] OR;  Service: Open Heart Surgery;  Laterality: N/A;   RIGHT HEART CATH N/A 09/08/2021    Procedure: RIGHT HEART CATH;  Surgeon: Darlis Eisenmenger, MD;  Location: [facility] INVASIVE CV LAB;  Service: Cardiovascular;  Laterality: N/A;   TEE WITHOUT CARDIOVERSION N/A 09/12/2021    Procedure: TRANSESOPHAGEAL ECHOCARDIOGRAM (TEE);  Surgeon: Zelphia Higashi, MD;  Location: [facility] OR;  Service: Open Heart Surgery;  Laterality: N/A;   TEE WITHOUT CARDIOVERSION N/A 09/17/2021    Procedure: TRANSESOPHAGEAL ECHOCARDIOGRAM (TEE);  Surgeon: Bartley Lightning, MD;  Location: [facility] OR;  Service: Open Heart Surgery;  Laterality: N/A;             Family History  Problem Relation Age of Onset   Hypertension Mother     Hypertension Father     Hyperlipidemia Father     Heart attack Father     Heart failure Father          Social History:  reports that he has quit smoking. He has never used smokeless tobacco. He reports that he does not drink alcohol and does not use drugs. Allergies:  Allergies       Allergies  Allergen Reactions   Mushroom Extract Complex (Obsolete) Nausea And Vomiting   Doxycycline  Nausea And Vomiting   Neosporin [Neomycin-Bacitracin Zn-Polymyx] Hives   Tape Other (See Comments)      Some tapes/dressings can irritate the skin            Medications Prior to Admission   Medication Sig Dispense Refill   cefadroxil  (DURICEF) 500 MG capsule Take 500 mg by mouth 2 (two) times daily.       furosemide  (LASIX ) 20 MG tablet Take 2 tablets (40 mg total) by mouth daily. 270 tablet 1   magnesium  oxide (MAG-OX) 400 MG tablet Take 1 tablet (400 mg total) by mouth daily. 90 tablet 3   Multiple Vitamins-Minerals (EMERGEN-C IMMUNE PO) Take 1 Dose by mouth daily as needed (Supplement).       pantoprazole  (PROTONIX ) 40 MG tablet TAKE 1 TABLET BY MOUTH EVERY DAY 90 tablet 1   rosuvastatin  (CRESTOR ) 10 MG tablet TAKE 1 TABLET BY MOUTH EVERY DAY 90 tablet 3   sildenafil  (REVATIO ) 20 MG tablet Take 1 tablet (20 mg total) by mouth 3 (three) times daily. 90 tablet 11   warfarin (COUMADIN ) 2 MG  tablet Instructed to take 4 mg (2 tabs) every Monday and 2 mg (1 tab) all other days or as directed by HF Clinic 50 tablet 11   potassium chloride  SA (KLOR-CON  M) 20 MEQ tablet Take 1 tablet (20 mEq total) by mouth daily. (Patient not taking: Reported on 02/06/2024) 90 tablet 3              Home: Home Living Family/patient expects to be discharged to:: Private residence Living Arrangements: Spouse/significant other Available Help at Discharge: Family Type of Home: House Home Access: Stairs to enter Secretary/administrator of Steps: 1 Home Layout: Two level, Bed/bath upstairs Alternate Level Stairs-Number of Steps: flight Alternate Level Stairs-Rails: Right Bathroom Shower/Tub: Health visitor: Pharmacist, community: Yes Home Equipment: Rollator (4 wheels)  Lives With: Spouse   Functional History: Prior Function Prior Level of Function : Independent/Modified Independent Mobility Comments: no AD use ADLs Comments: ind   Functional Status:  Mobility: Bed Mobility Overal bed mobility: Needs Assistance Bed Mobility: Supine to Sit Supine to sit: Supervision General bed mobility comments: pt received sitting up in chair Transfers Overall transfer  level: Needs assistance Equipment used: Rolling walker (2 wheels) Transfers: Sit to/from Stand, Bed to chair/wheelchair/BSC Sit to Stand: Min assist Bed to/from chair/wheelchair/BSC transfer type:: Step pivot Step pivot transfers: Mod assist, +2 physical assistance General transfer comment: max directional verbal cues to power up, tactile cues for R hand placement Ambulation/Gait Ambulation/Gait assistance: Mod assist, +2 safety/equipment Gait Distance (Feet): 100 Feet Assistive device: Rolling walker (2 wheels) Gait Pattern/deviations: Step-to pattern, Decreased stride length, Decreased dorsiflexion - right, Decreased weight shift to right, Antalgic, Narrow base of support General Gait Details: pt vearing to the L, modA for walker management, modA for R LE advancement. initially PT provided maxA to advance R LE. pt then able to clear R foot with tactile cues at R hip flexor. pt unable to clear R foot without tactile cues or physical assist Gait velocity: dec Gait velocity interpretation: <1.31 ft/sec, indicative of household ambulator   ADL: ADL Overall ADL's : Needs assistance/impaired Eating/Feeding: Minimal assistance Grooming: Minimal assistance, Standing Grooming Details (indicate cue type and reason): cues Upper Body Bathing: Moderate assistance, Sitting, Standing Lower Body Bathing: Moderate assistance, Sitting/lateral leans, Sit to/from stand Upper Body Dressing : Moderate assistance Upper Body Dressing Details (indicate cue type and reason): donning LVAD vest, swtich VAD wall > batteries Lower Body Dressing: Moderate assistance, Sitting/lateral leans, Sit to/from stand Toilet Transfer: Minimal assistance, Ambulation, Rolling walker (2 wheels) Toilet Transfer Details (indicate cue type and reason): R lateral lean when fatigued and BLE buckling after incr time standing Toileting- Clothing Manipulation and Hygiene: Maximal assistance Functional mobility during ADLs: Minimal  assistance, Rolling walker (2 wheels)   Cognition: Cognition Overall Cognitive Status: Impaired/Different from baseline Arousal/Alertness: Lethargic Orientation Level: Oriented to person, Oriented to place, Disoriented to time, Disoriented to situation Attention: Focused, Sustained, Selective Focused Attention: Appears intact Sustained Attention: Impaired Sustained Attention Impairment: Verbal basic, Functional basic Selective Attention: Impaired Selective Attention Impairment: Verbal basic, Functional basic Memory:  (NT) Awareness: Impaired Awareness Impairment: Intellectual impairment, Emergent impairment, Anticipatory impairment Problem Solving: Impaired Problem Solving Impairment: Functional basic, Verbal basic Cognition Arousal: Alert Behavior During Therapy: WFL for tasks assessed/performed Overall Cognitive Status: Impaired/Different from baseline   Physical Exam: Blood pressure 90/77, pulse (!) 103, temperature 98 F (36.7 C), temperature source Oral, resp. rate 14, height 5\' 9"  (1.753 m), weight 80.7 kg, SpO2 93%. Physical Exam Vitals and nursing note reviewed.  Exam conducted with a chaperone present.  Constitutional:      General: He is not in acute distress.    Appearance: He is normal weight. He is ill-appearing.     Comments: Pt awake, staring out window watching "people"- wife joined us  and nursing changed out his O2 sensor since it was reading low 80's- and now 90-94% after changed- pt denies SOB; Sitting up in bedside chair; on LVAD, No acute distress  HENT:     Head: Normocephalic and atraumatic.     Comments:  Mild R facial droop- improved with smile Intact to light touch with facial sensation    Right Ear: External ear normal.     Left Ear: External ear normal.     Nose: Nose normal. No congestion.     Mouth/Throat:     Mouth: Mucous membranes are dry.     Pharynx: Oropharynx is clear. No oropharyngeal exudate.  Eyes:     General:        Right eye: No  discharge.        Left eye: No discharge.     Comments: R severe Visual deficit- cannot see past midline to Right EOM's- cannot look to R past midline unless cued multiple times, but notes no vision Wearing eyeglasses  Cardiovascular:     Comments: LVAD hum heard throughout Pulmonary:     Comments: CTA B/L, however decreased at bases- was the same on Tuesday- - W/R/R heard Abdominal:     General: There is distension.     Comments: Significant abd distension/bloating Nontender; hypoactive BS- drinking an Ensure  Musculoskeletal:     Cervical back: Neck supple. No tenderness.     Comments: RUE- 4/5 throughout however has R sided neglect- hard to access LUE- 5/5 except FA 4+/5 as well as grip 5-/5 RLE- HF 2/5; KE/KF 4-/5; DF 4-/5 and PF 4/5 LLE- 5/5 throughout  Skin:    General: Skin is warm and dry.     Comments: IV in L AC fossa still looks OK No LE edema at all B/L-   Neurological:     Mental Status: He is alert.     Comments: Patient is alert.  Mild left gaze preference.  Follows commands.  He does answer name and place appropriately with some word hesitancy.  Follows simple commands Pt has delayed responses and speech; some perseveration Memory still 0/3 even with cues Did recognize me, but couldn't name my name or role- but face showed recognition Some expressive aphasia noted Still not able to remember wife's Bday was 5/20- even with cues  Psychiatric:     Comments: Extremely flat and vague- staring out the window unless actively engaged in conversation        Lab Results Last 48 Hours        Results for orders placed or performed during the hospital encounter of 02/05/24 (from the past 48 hours)  Lactate dehydrogenase     Status: None    Collection Time: 02/09/24  2:40 AM  Result Value Ref Range    LDH 169 98 - 192 U/L      Comment: Performed at [facility] Lab, [address], [place], [place] [address]  Protime-INR     Status: Abnormal    Collection Time:  02/09/24  2:40 AM  Result Value Ref Range    Prothrombin Time 27.4 (H) 11.4 - 15.2 seconds    INR 2.5 (H) 0.8 - 1.2      Comment: (NOTE)  INR goal varies based on device and disease states. Performed at [facility], [address] [place], [place] [address]    CBC     Status: Abnormal    Collection Time: 02/09/24  2:40 AM  Result Value Ref Range    WBC 3.8 (L) 4.0 - 10.5 K/uL    RBC 4.35 4.22 - 5.81 MIL/uL    Hemoglobin 13.0 13.0 - 17.0 g/dL    HCT 60.4 54.0 - 98.1 %    MCV 91.7 80.0 - 100.0 fL    MCH 29.9 26.0 - 34.0 pg    MCHC 32.6 30.0 - 36.0 g/dL    RDW 19.1 47.8 - 29.5 %    Platelets 178 150 - 400 K/uL    nRBC 0.0 0.0 - 0.2 %      Comment: Performed at [facility] Lab, [address] [place], [place] [address]  Basic metabolic panel     Status: Abnormal    Collection Time: 02/09/24  2:40 AM  Result Value Ref Range    Sodium 137 135 - 145 mmol/L    Potassium 4.4 3.5 - 5.1 mmol/L    Chloride 107 98 - 111 mmol/L    CO2 20 (L) 22 - 32 mmol/L    Glucose, Bld 100 (H) 70 - 99 mg/dL      Comment: Glucose reference range applies only to samples taken after fasting for at least 8 hours.    BUN 29 (H) 8 - 23 mg/dL    Creatinine, Ser 8.65 (H) 0.61 - 1.24 mg/dL    Calcium  8.9 8.9 - 10.3 mg/dL    GFR, Estimated 32 (L) >60 mL/min      Comment: (NOTE) Calculated using the CKD-EPI Creatinine Equation (2021)      Anion gap 10 5 - 15      Comment: Performed at [facility], [address], [place], [place] [address]  Surgical PCR screen     Status: None    Collection Time: 02/09/24  1:49 PM    Specimen: Nasal Mucosa; Nasal Swab  Result Value Ref Range    MRSA, PCR NEGATIVE NEGATIVE    Staphylococcus aureus NEGATIVE NEGATIVE      Comment: (NOTE) The Xpert SA Assay (FDA approved for NASAL specimens in patients 46 years of age and older), is one component of a comprehensive surveillance program. It is not intended to diagnose infection nor to guide or monitor  treatment. Performed at [facility] [place] [facility], [address], [place], [place] [address]    Lactate dehydrogenase     Status: None    Collection Time: 02/10/24  2:29 AM  Result Value Ref Range    LDH 158 98 - 192 U/L      Comment: Performed at [facility] Lab, [address], [place], [place] [address]  Protime-INR     Status: Abnormal    Collection Time: 02/10/24  2:29 AM  Result Value Ref Range    Prothrombin Time 26.3 (H) 11.4 - 15.2 seconds    INR 2.4 (H) 0.8 - 1.2      Comment: (NOTE) INR goal varies based on device and disease states. Performed at [facility] Lab, [address], [place], [place] [address]    CBC     Status: Abnormal    Collection Time: 02/10/24  2:29 AM  Result Value Ref Range    WBC 3.6 (L) 4.0 - 10.5 K/uL    RBC 4.13 (  L) 4.22 - 5.81 MIL/uL    Hemoglobin 12.3 (L) 13.0 - 17.0 g/dL    HCT 29.5 (L) 28.4 - 52.0 %    MCV 91.5 80.0 - 100.0 fL    MCH 29.8 26.0 - 34.0 pg    MCHC 32.5 30.0 - 36.0 g/dL    RDW 13.2 44.0 - 10.2 %    Platelets 203 150 - 400 K/uL    nRBC 0.0 0.0 - 0.2 %      Comment: Performed at [facility] Lab, [address], [place], [place] [address]  Basic metabolic panel     Status: Abnormal    Collection Time: 02/10/24  2:29 AM  Result Value Ref Range    Sodium 138 135 - 145 mmol/L    Potassium 4.2 3.5 - 5.1 mmol/L    Chloride 110 98 - 111 mmol/L    CO2 21 (L) 22 - 32 mmol/L    Glucose, Bld 105 (H) 70 - 99 mg/dL      Comment: Glucose reference range applies only to samples taken after fasting for at least 8 hours.    BUN 27 (H) 8 - 23 mg/dL    Creatinine, Ser 6.44 (H) 0.61 - 1.24 mg/dL    Calcium  8.6 (L) 8.9 - 10.3 mg/dL    GFR, Estimated 30 (L) >60 mL/min      Comment: (NOTE) Calculated using the CKD-EPI Creatinine Equation (2021)      Anion gap 7 5 - 15      Comment: Performed at [facility] Lab, [address], [place], [place] [address]       Imaging Results (Last 48 hours)  EP PPM/ICD IMPLANT Result Date:  02/09/2024 Conclusion: Successful removal of a previously implanted dual-chamber ICD and insertion of new dual-chamber ICD in a patient with chronic systolic heart failure status post left ventricular assist device. Manya Sells, MD           Blood pressure 90/77, pulse (!) 103, temperature 98 F (36.7 C), temperature source Oral, resp. rate 14, height 5\' 9"  (1.753 m), weight 80.7 kg, SpO2 93%.   Medical Problem List and Plan: 1. 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  Antithrombotics: -DVT/anticoagulation:  Pharmaceutical: Coumadin .  Goal INR 2.5-3.0 due to recent CVA             -antiplatelet therapy: N/A 3. Pain Management: Hydrocodone  as needed 4. Mood/Behavior/Sleep: Provide emotional support             -antipsychotic agents: N/A 5. Neuropsych/cognition: This patient is NOT capable of making decisions on his own behalf. 6. Skin/Wound Care: Routine skin checks and LVAD team following for line drive, etc 7. Fluids/Electrolytes/Nutrition: Routine in and outs with follow-up chemistries 8.  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  Continue Revatio .  Followed by heart failure team.  Status post generator change 02/09/2024- team wants pressure dressing "for as long as possible"- due to coumadin  9.  CKD stage III.  Follow-up chemistries 10.  Remote tobacco use.  Provide counseling 11.  Hyperlipidemia.  Crestor  12. Severe constipation- will need to order Sorbitol  tomorrow after therapy- since has been at least 8 days since LBM.  13. Patient wants a haircut- suaully wears hair "bald"- and shaves at home and on coumadin - please evaluate if possible.       Everlyn Hockey Angiulli, PA-C 02/10/2024     I have personally performed a face to face diagnostic evaluation of this patient and formulated the key components of the plan.  Additionally, I have personally reviewed laboratory data, imaging studies, as well as relevant notes and concur with the physician assistant's documentation above.   The patient's status has not changed from the original H&P.  Any changes in documentation from the acute care chart have been noted above.

## 2024-02-10 NOTE — H&P (Signed)
 Physical Medicine and Rehabilitation Admission H&P    Chief Complaint  Patient presents with   Weakness  : HPI: Isaac [name]. Karen is a 73 year old right-handed male with history of CAD, hypertension, atrial fibrillation, CKD stage IV, tobacco use, VT and chronic systolic congestive heart failure status post HM-3 LVAD for  on 09/17/2021 maintained on Coumadin  therapy.  Per chart review patient lives with spouse.  Two-level home bed and bath upstairs with one-step to entry.  Independent prior to admission who walked with Rolling walker prior to admission.  Presented 02/05/2024 when his pacemaker went off abruptly resulting in a fall as well as noted right side weakness and aphasia and blurred vision.  Cranial CT scan positive for acute to subacute appearing left PCA and left cerebellar infarct.  Hyperdense left PCA suggesting occlusion of that vessel.  He was also diagnosed with R frontal gyrus MCA infarct.  No hemorrhagic transformation or mass effect.  Underlying advanced chronic small vessel disease.  Right supraorbital/forehead recent soft tissue injury with no skull fracture identified.  CTA head and neck demonstrates subtotal occlusion of the left PCA and the P2/P3 junction and additionally moderate to severe bilateral ICA siphon atherosclerosis with at least moderate siphon stenosis bilaterally.  Admission chemistries unremarkable except glucose 106 BUN 42 creatinine 2.48, INR 2.2, troponin 133, BNP 343.  Follow-up CT of the head 02/06/2024 expected evolution of left cerebellar and left PCA infarct no hemorrhage or mass effect.  Evolution also of a right MCA middle frontal gyrus infarct.  Echocardiogram with ejection fraction of 20 to 25%.  The left ventricle demonstrating global hypokinesis.  Neurology follow-up patient currently remains on Coumadin  therapy with goal INR 2.5-3.0.  With dual-chamber ICD in place status post generator change 02/09/2024.  Patient is tolerating a regular consistency diet.   Therapy evaluations completed due to patient decreased functional mobility right side weakness and aphasia was admitted for a comprehensive rehab program.    Per Cardiology, they want him to keep his compression dressing for ACID "as long as possible" since on Coumadin .   Patient reports LBM 8-9 days ago- per wife and chart, this appears to be true. Usually goes every 1-2 days at home. Pt reports feeling constipated and needs to have BM.  Wife feels like he's slightly sharper, but still not able to tell me that Wife's Birthday was 02[birth year] when asked.   Pt specifically asking to shave his head- he did at home.    Review of Systems  Constitutional:  Negative for chills and fever.  HENT:  Negative for hearing loss.   Eyes:  Positive for blurred vision.  Respiratory:  Negative for cough, shortness of breath and wheezing.   Cardiovascular:  Positive for leg swelling. Negative for chest pain and palpitations.  Gastrointestinal:  Positive for constipation. Negative for heartburn, nausea and vomiting.  Genitourinary:  Negative for dysuria, flank pain and hematuria.  Musculoskeletal:  Positive for myalgias.  Skin:  Negative for rash.  Neurological:  Positive for speech change, focal weakness and weakness.  Psychiatric/Behavioral:  Positive for memory loss.        Wife reports flat, and poor memory  All other systems reviewed and are negative.  Past Medical History:  Diagnosis Date   Arrhythmia    atrial fibrillation   CHF (congestive heart failure) (HCC)    Chronic kidney disease    Coronary artery disease    Hyperlipidemia    Hypertension    Myocardial infarct (HCC)  Past Surgical History:  Procedure Laterality Date   CARDIAC DEFIBRILLATOR PLACEMENT  feb 2014   ICD GENERATOR CHANGEOUT N/A 02/09/2024   Procedure: ICD GENERATOR CHANGEOUT;  Surgeon: Tammie Fall, MD;  Location: [facility] INVASIVE CV LAB;  Service: Cardiovascular;  Laterality: N/A;   INSERTION OF IMPLANTABLE LEFT  VENTRICULAR ASSIST DEVICE N/A 09/17/2021   Procedure: INSERTION OF IMPLANTABLE LEFT VENTRICULAR ASSIST DEVICE AND INSERTION OF FEMORAL ARTERIAL LINE;  Surgeon: Bartley Lightning, MD;  Location: [facility] OR;  Service: Open Heart Surgery;  Laterality: N/A;   IR FLUORO GUIDE CV LINE RIGHT  09/27/2021   IR THORACENTESIS ASP PLEURAL SPACE W/IMG GUIDE  09/27/2021   IR US  GUIDE VASC ACCESS RIGHT  09/27/2021   PLACEMENT OF IMPELLA LEFT VENTRICULAR ASSIST DEVICE N/A 09/12/2021   Procedure: PLACEMENT OF IMPELLA 5.5 LEFT VENTRICULAR ASSIST DEVICE;  Surgeon: Zelphia Higashi, MD;  Location: [facility] OR;  Service: Open Heart Surgery;  Laterality: N/A;   RIGHT HEART CATH N/A 09/08/2021   Procedure: RIGHT HEART CATH;  Surgeon: Darlis Eisenmenger, MD;  Location: [facility] INVASIVE CV LAB;  Service: Cardiovascular;  Laterality: N/A;   TEE WITHOUT CARDIOVERSION N/A 09/12/2021   Procedure: TRANSESOPHAGEAL ECHOCARDIOGRAM (TEE);  Surgeon: Zelphia Higashi, MD;  Location: [facility] OR;  Service: Open Heart Surgery;  Laterality: N/A;   TEE WITHOUT CARDIOVERSION N/A 09/17/2021   Procedure: TRANSESOPHAGEAL ECHOCARDIOGRAM (TEE);  Surgeon: Bartley Lightning, MD;  Location: [facility] OR;  Service: Open Heart Surgery;  Laterality: N/A;   Family History  Problem Relation Age of Onset   Hypertension Mother    Hypertension Father    Hyperlipidemia Father    Heart attack Father    Heart failure Father    Social History:  reports that he has quit smoking. He has never used smokeless tobacco. He reports that he does not drink alcohol and does not use drugs. Allergies:  Allergies  Allergen Reactions   Mushroom Extract Complex (Obsolete) Nausea And Vomiting   Doxycycline  Nausea And Vomiting   Neosporin [Neomycin-Bacitracin Zn-Polymyx] Hives   Tape Other (See Comments)    Some tapes/dressings can irritate the skin   Medications Prior to Admission  Medication Sig Dispense Refill   cefadroxil  (DURICEF) 500 MG capsule Take 500 mg by mouth 2 (two) times  daily.     furosemide  (LASIX ) 20 MG tablet Take 2 tablets (40 mg total) by mouth daily. 270 tablet 1   magnesium  oxide (MAG-OX) 400 MG tablet Take 1 tablet (400 mg total) by mouth daily. 90 tablet 3   Multiple Vitamins-Minerals (EMERGEN-C IMMUNE PO) Take 1 Dose by mouth daily as needed (Supplement).     pantoprazole  (PROTONIX ) 40 MG tablet TAKE 1 TABLET BY MOUTH EVERY DAY 90 tablet 1   rosuvastatin  (CRESTOR ) 10 MG tablet TAKE 1 TABLET BY MOUTH EVERY DAY 90 tablet 3   sildenafil  (REVATIO ) 20 MG tablet Take 1 tablet (20 mg total) by mouth 3 (three) times daily. 90 tablet 11   warfarin (COUMADIN ) 2 MG tablet Instructed to take 4 mg (2 tabs) every Monday and 2 mg (1 tab) all other days or as directed by HF Clinic 50 tablet 11   potassium chloride  SA (KLOR-CON  M) 20 MEQ tablet Take 1 tablet (20 mEq total) by mouth daily. (Patient not taking: Reported on 02/06/2024) 90 tablet 3      Home: Home Living Family/patient expects to be discharged to:: Private residence Living Arrangements: Spouse/significant other Available Help at Discharge: Family Type of Home: House Home Access:  Stairs to enter [employer] of Steps: 1 Home Layout: Two level, Bed/bath upstairs Alternate Level Stairs-Number of Steps: flight Alternate Level Stairs-Rails: Right Bathroom Shower/Tub: Health visitor: Standard Bathroom Accessibility: Yes Home Equipment: Rollator (4 wheels)  Lives With: Spouse   Functional History: Prior Function Prior Level of Function : Independent/Modified Independent Mobility Comments: no AD use ADLs Comments: ind  Functional Status:  Mobility: Bed Mobility Overal bed mobility: Needs Assistance Bed Mobility: Supine to Sit Supine to sit: Supervision General bed mobility comments: pt received sitting up in chair Transfers Overall transfer level: Needs assistance Equipment used: Rolling walker (2 wheels) Transfers: Sit to/from Stand, Bed to  chair/wheelchair/BSC Sit to Stand: Min assist Bed to/from chair/wheelchair/BSC transfer type:: Step pivot Step pivot transfers: Mod assist, +2 physical assistance General transfer comment: max directional verbal cues to power up, tactile cues for R hand placement Ambulation/Gait Ambulation/Gait assistance: Mod assist, +2 safety/equipment Gait Distance (Feet): 100 Feet Assistive device: Rolling walker (2 wheels) Gait Pattern/deviations: Step-to pattern, Decreased stride length, Decreased dorsiflexion - right, Decreased weight shift to right, Antalgic, Narrow base of support General Gait Details: pt vearing to the L, modA for walker management, modA for R LE advancement. initially PT provided maxA to advance R LE. pt then able to clear R foot with tactile cues at R hip flexor. pt unable to clear R foot without tactile cues or physical assist Gait velocity: dec Gait velocity interpretation: <1.31 ft/sec, indicative of household ambulator    ADL: ADL Overall ADL's : Needs assistance/impaired Eating/Feeding: Minimal assistance Grooming: Minimal assistance, Standing Grooming Details (indicate cue type and reason): cues Upper Body Bathing: Moderate assistance, Sitting, Standing Lower Body Bathing: Moderate assistance, Sitting/lateral leans, Sit to/from stand Upper Body Dressing : Moderate assistance Upper Body Dressing Details (indicate cue type and reason): donning LVAD vest, swtich VAD wall > batteries Lower Body Dressing: Moderate assistance, Sitting/lateral leans, Sit to/from stand Toilet Transfer: Minimal assistance, Ambulation, Rolling walker (2 wheels) Toilet Transfer Details (indicate cue type and reason): R lateral lean when fatigued and BLE buckling after incr time standing Toileting- Clothing Manipulation and Hygiene: Maximal assistance Functional mobility during ADLs: Minimal assistance, Rolling walker (2 wheels)  Cognition: Cognition Overall Cognitive Status: Impaired/Different  from baseline Arousal/Alertness: Lethargic Orientation Level: Oriented to person, Oriented to place, Disoriented to time, Disoriented to situation Attention: Focused, Sustained, Selective Focused Attention: Appears intact Sustained Attention: Impaired Sustained Attention Impairment: Verbal basic, Functional basic Selective Attention: Impaired Selective Attention Impairment: Verbal basic, Functional basic Memory:  (NT) Awareness: Impaired Awareness Impairment: Intellectual impairment, Emergent impairment, Anticipatory impairment Problem Solving: Impaired Problem Solving Impairment: Functional basic, Verbal basic Cognition Arousal: Alert Behavior During Therapy: WFL for tasks assessed/performed Overall Cognitive Status: Impaired/Different from baseline  Physical Exam: Blood pressure 90/77, pulse (!) 103, temperature 98 F (36.7 C), temperature source Oral, resp. rate 14, height 5\' 9"  (1.753 m), weight 80.7 kg, SpO2 93%. Physical Exam Vitals and nursing note reviewed. Exam conducted with a chaperone present.  Constitutional:      General: He is not in acute distress.    Appearance: He is normal weight. He is ill-appearing.     Comments: Pt awake, staring out window watching "people"- wife joined us  and nursing changed out his O2 sensor since it was reading low 80's- and now 90-94% after changed- pt denies SOB; Sitting up in bedside chair; on LVAD, No acute distress  HENT:     Head: Normocephalic and atraumatic.     Comments:  Mild R facial  droop- improved with smile Intact to light touch with facial sensation    Right Ear: External ear normal.     Left Ear: External ear normal.     Nose: Nose normal. No congestion.     Mouth/Throat:     Mouth: Mucous membranes are dry.     Pharynx: Oropharynx is clear. No oropharyngeal exudate.  Eyes:     General:        Right eye: No discharge.        Left eye: No discharge.     Comments: R severe Visual deficit- cannot see past midline to  Right EOM's- cannot look to R past midline unless cued multiple times, but notes no vision Wearing eyeglasses  Cardiovascular:     Comments: LVAD hum heard throughout Pulmonary:     Comments: CTA B/L, however decreased at bases- was the same on Tuesday- - W/R/R heard Abdominal:     General: There is distension.     Comments: Significant abd distension/bloating Nontender; hypoactive BS- drinking an Ensure  Musculoskeletal:     Cervical back: Neck supple. No tenderness.     Comments: RUE- 4/5 throughout however has R sided neglect- hard to access LUE- 5/5 except FA 4+/5 as well as grip 5-/5 RLE- HF 2/5; KE/KF 4-/5; DF 4-/5 and PF 4/5 LLE- 5/5 throughout  Skin:    General: Skin is warm and dry.     Comments: IV in L AC fossa still looks OK No LE edema at all B/L-   Neurological:     Mental Status: He is alert.     Comments: Patient is alert.  Mild left gaze preference.  Follows commands.  He does answer name and place appropriately with some word hesitancy.  Follows simple commands Pt has delayed responses and speech; some perseveration Memory still 0/3 even with cues Did recognize me, but couldn't name my name or role- but face showed recognition Some expressive aphasia noted Still not able to remember wife's Bday was 5/20- even with cues  Psychiatric:     Comments: Extremely flat and vague- staring out the window unless actively engaged in conversation     Results for orders placed or performed during the hospital encounter of 02/05/24 (from the past 48 hours)  Lactate dehydrogenase     Status: None   Collection Time: 02/09/24  2:40 AM  Result Value Ref Range   LDH 169 98 - 192 U/L    Comment: Performed at [facility], [address] [place], [place] [address]  Protime-INR     Status: Abnormal   Collection Time: 02/09/24  2:40 AM  Result Value Ref Range   Prothrombin Time 27.4 (H) 11.4 - 15.2 seconds   INR 2.5 (H) 0.8 - 1.2    Comment: (NOTE) INR goal varies based  on device and disease states. Performed at [facility], [address], [place], [place] [address]   CBC     Status: Abnormal   Collection Time: 02/09/24  2:40 AM  Result Value Ref Range   WBC 3.8 (L) 4.0 - 10.5 K/uL   RBC 4.35 4.22 - 5.81 MIL/uL   Hemoglobin 13.0 13.0 - 17.0 g/dL   HCT 09.8 11.9 - 14.7 %   MCV 91.7 80.0 - 100.0 fL   MCH 29.9 26.0 - 34.0 pg   MCHC 32.6 30.0 - 36.0 g/dL   RDW 82.9 56.2 - 13.0 %   Platelets 178 150 - 400 K/uL   nRBC  0.0 0.0 - 0.2 %    Comment: Performed at [facility], [address] [place], [place] [address]  Basic metabolic panel     Status: Abnormal   Collection Time: 02/09/24  2:40 AM  Result Value Ref Range   Sodium 137 135 - 145 mmol/L   Potassium 4.4 3.5 - 5.1 mmol/L   Chloride 107 98 - 111 mmol/L   CO2 20 (L) 22 - 32 mmol/L   Glucose, Bld 100 (H) 70 - 99 mg/dL    Comment: Glucose reference range applies only to samples taken after fasting for at least 8 hours.   BUN 29 (H) 8 - 23 mg/dL   Creatinine, Ser 1.91 (H) 0.61 - 1.24 mg/dL   Calcium  8.9 8.9 - 10.3 mg/dL   GFR, Estimated 32 (L) >60 mL/min    Comment: (NOTE) Calculated using the CKD-EPI Creatinine Equation (2021)    Anion gap 10 5 - 15    Comment: Performed at [facility], [address] [place], [place] [address]  Surgical PCR screen     Status: None   Collection Time: 02/09/24  1:49 PM   Specimen: Nasal Mucosa; Nasal Swab  Result Value Ref Range   MRSA, PCR NEGATIVE NEGATIVE   Staphylococcus aureus NEGATIVE NEGATIVE    Comment: (NOTE) The Xpert SA Assay (FDA approved for NASAL specimens in patients 71 years of age and older), is one component of a comprehensive surveillance program. It is not intended to diagnose infection nor to guide or monitor treatment. Performed at [facility], [address] [place], [place] [address]   Lactate dehydrogenase     Status: None   Collection Time: 02/10/24  2:29 AM  Result Value Ref Range   LDH 158 98  - 192 U/L    Comment: Performed at [facility], [address], [place], [place] [address]  Protime-INR     Status: Abnormal   Collection Time: 02/10/24  2:29 AM  Result Value Ref Range   Prothrombin Time 26.3 (H) 11.4 - 15.2 seconds   INR 2.4 (H) 0.8 - 1.2    Comment: (NOTE) INR goal varies based on device and disease states. Performed at [facility], [address] [place], [place] [address]   CBC     Status: Abnormal   Collection Time: 02/10/24  2:29 AM  Result Value Ref Range   WBC 3.6 (L) 4.0 - 10.5 K/uL   RBC 4.13 (L) 4.22 - 5.81 MIL/uL   Hemoglobin 12.3 (L) 13.0 - 17.0 g/dL   HCT 29.5 (L) 28.4 - 13.2 %   MCV 91.5 80.0 - 100.0 fL   MCH 29.8 26.0 - 34.0 pg   MCHC 32.5 30.0 - 36.0 g/dL   RDW 44.0 10.2 - 72.5 %   Platelets 203 150 - 400 K/uL   nRBC 0.0 0.0 - 0.2 %    Comment: Performed at [facility], [address], [place], [place] [address]  Basic metabolic panel     Status: Abnormal   Collection Time: 02/10/24  2:29 AM  Result Value Ref Range   Sodium 138 135 - 145 mmol/L   Potassium 4.2 3.5 - 5.1 mmol/L   Chloride 110 98 - 111 mmol/L   CO2 21 (L) 22 - 32 mmol/L   Glucose, Bld 105 (H) 70 - 99 mg/dL    Comment: Glucose reference range applies only to samples taken after fasting for at least 8  hours.   BUN 27 (H) 8 - 23 mg/dL   Creatinine, Ser 0.98 (H) 0.61 - 1.24 mg/dL   Calcium  8.6 (L) 8.9 - 10.3 mg/dL   GFR, Estimated 30 (L) >60 mL/min    Comment: (NOTE) Calculated using the CKD-EPI Creatinine Equation (2021)    Anion gap 7 5 - 15    Comment: Performed at [facility], [address] [place], [place] [address]   EP PPM/ICD IMPLANT Result Date: 02/09/2024 Conclusion: Successful removal of a previously implanted dual-chamber ICD and insertion of new dual-chamber ICD in a patient with chronic systolic heart failure status post left ventricular assist device. Manya Sells, MD      Blood pressure 90/77, pulse (!) 103, temperature 98  F (36.7 C), temperature source Oral, resp. rate 14, height 5\' 9"  (1.753 m), weight 80.7 kg, SpO2 93%.  Medical Problem List and Plan: 1. 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  Antithrombotics: -DVT/anticoagulation:  Pharmaceutical: Coumadin .  Goal INR 2.5-3.0 due to recent CVA  -antiplatelet therapy: N/A 3. Pain Management: Hydrocodone  as needed 4. Mood/Behavior/Sleep: Provide emotional support  -antipsychotic agents: N/A 5. Neuropsych/cognition: This patient is NOT capable of making decisions on his own behalf. 6. Skin/Wound Care: Routine skin checks and LVAD team following for line drive, etc 7. Fluids/Electrolytes/Nutrition: Routine in and outs with follow-up chemistries 8.  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  Continue Revatio .  Followed by heart failure team.  Status post generator change 02/09/2024- team wants pressure dressing "for as long as possible"- due to coumadin  9.  CKD stage III.  Follow-up chemistries 10.  Remote tobacco use.  Provide counseling 11.  Hyperlipidemia.  Crestor  12. Severe constipation- will need to order Sorbitol  tomorrow after therapy- since has been at least 8 days since LBM.  13. Patient wants a haircut- suaully wears hair "bald"- and shaves at home and on coumadin - please evaluate if possible.    Everlyn Hockey Angiulli, PA-C 02/10/2024   I have personally performed a face to face diagnostic evaluation of this patient and formulated the key components of the plan.  Additionally, I have personally reviewed laboratory data, imaging studies, as well as relevant notes and concur with the physician assistant's documentation above.   The patient's  status has not changed from the original H&P.  Any changes in documentation from the acute care chart have been noted above.

## 2024-02-10 NOTE — Progress Notes (Addendum)
 PHARMACY - ANTICOAGULATION CONSULT NOTE  Pharmacy Consult for warfarin  Indication: LVAD, new acute ischemic infarct   Allergies  Allergen Reactions   Mushroom Extract Complex (Obsolete) Nausea And Vomiting   Doxycycline  Nausea And Vomiting   Neosporin [Neomycin-Bacitracin Zn-Polymyx] Hives   Tape Other (See Comments)    Some tapes/dressings can irritate the skin    Patient Measurements: Height: 5\' 9"  (175.3 cm) Weight: 80.7 kg (177 lb 14.4 oz) IBW/kg (Calculated) : 70.7 HEPARIN  DW (KG): 83.5  Vital Signs: Temp: 98 F (36.7 C) (05/22 0450) Temp Source: Oral (05/22 0450) BP: 101/70 (05/22 0450) Pulse Rate: 96 (05/22 0450)  Labs: Recent Labs    02/08/24 0249 02/09/24 0240 02/10/24 0229  HGB 12.5* 13.0 12.3*  HCT 38.9* 39.9 37.8*  PLT 183 178 203  LABPROT 28.8* 27.4* 26.3*  INR 2.7* 2.5* 2.4*  CREATININE 2.30* 2.17* 2.26*    Estimated Creatinine Clearance: 30 mL/min (A) (by C-G formula based on SCr of 2.26 mg/dL (H)).   Medical History: Past Medical History:  Diagnosis Date   Arrhythmia    atrial fibrillation   CHF (congestive heart failure) (HCC)    Chronic kidney disease    Coronary artery disease    Hyperlipidemia    Hypertension    Myocardial infarct [facility])    Assessment: 83 yoM with HM LVAD on warfarin PTA admitted with acute CVA. Home warfarin regimen is 4mg  Mon/2mg  AODs with INR goal 2-2.5. Per HF and neurology, increase INR goal to 2.5-3 for now. Pt previously on heparin  briefly this admit.  Pt s/p gen change, needed INR on lower end of range per EP request for the procedure. INR today is 2.4, CBC and LDH ok  Goal of Therapy:  INR 2.5-3 Monitor platelets by anticoagulation protocol: Yes   Plan:  Warfarin 4mg  x1 tonight Daily INR   Levin Reamer, PharmD, BCPS, [facility] Clinical Pharmacist [phone] Please check AMION for all [facility] Pharmacy numbers 02/10/2024

## 2024-02-10 NOTE — Progress Notes (Signed)
 IP rehab admissions - Bed available on inpatient rehab today.  Patient agreeable and MD agreeable to CIR.  Will admit to CIR today.  [phone]

## 2024-02-10 NOTE — Discharge Instructions (Addendum)
 Inpatient Rehab Discharge Instructions  Isaac [name] Discharge date and time: No discharge date for patient encounter.   Activities/Precautions/ Functional Status: Activity: As tolerated Diet: Regular Wound Care: Routine skin checks Functional status:  ___ No restrictions     ___ Walk up steps independently ___ 24/7 supervision/assistance   ___ Walk up steps with assistance ___ Intermittent supervision/assistance  ___ Bathe/dress independently ___ Walk with walker     __x_ Bathe/dress with assistance ___ Walk Independently    ___ Shower independently ___ Walk with assistance    ___ Shower with assistance ___ No alcohol     ___ Return to work/school ________  Special Instructions: No driving smoking or alcohol  Follow-up [facility] for prothrombin time weekly [phone] fax [phone]   COMMUNITY REFERRALS UPON DISCHARGE:     Outpatient: PT    OT      SP             Agency:[facility]  [address] [place] [address] [phone]              Appointment Date/Time:WILL CALL TO SET UP FOLLOW UP APPOINTMENTS  Medical Equipment/Items Ordered:None recommended                                                 Agency/Supplier:na    My questions have been answered and I understand these instructions. I will adhere to these goals and the provided educational materials after my discharge from the hospital.  Patient/Caregiver Signature _______________________________ Date __________  Clinician Signature _______________________________________ Date __________  Please bring this form and your medication list with you to all your follow-up doctor's appointments. STROKE/TIA DISCHARGE INSTRUCTIONS SMOKING Cigarette smoking nearly doubles your risk of having a stroke & is the single most alterable risk factor  If you smoke or have smoked in the last 12 months, you are advised to quit smoking for your health. Most of the excess  cardiovascular risk related to smoking disappears within a year of stopping. Ask you doctor about anti-smoking medications [place] Quit Line: 1-800-QUIT NOW Free Smoking Cessation Classes [phone]  CHOLESTEROL Know your levels; limit fat & cholesterol in your diet  Lipid Panel     Component Value Date/Time   CHOL 118 02/06/2024 1330   TRIG 32 02/06/2024 1330   HDL 39 (L) 02/06/2024 1330   CHOLHDL 3.0 02/06/2024 1330   VLDL 6 02/06/2024 1330   LDLCALC 73 02/06/2024 1330     Many patients benefit from treatment even if their cholesterol is at goal. Goal: Total Cholesterol (CHOL) less than 160 Goal:  Triglycerides (TRIG) less than 150 Goal:  HDL greater than 40 Goal:  LDL (LDLCALC) less than 100   BLOOD PRESSURE American Stroke Association blood pressure target is less that 120/80 mm/Hg  Your discharge blood pressure is:  BP: (!) 89/69 Monitor your blood pressure Limit your salt and alcohol intake Many individuals will require more than one medication for high blood pressure  DIABETES (A1c is a blood sugar average for last 3 months) Goal HGBA1c is under 7% (HBGA1c is blood sugar average for last 3 months)  Diabetes: No known diagnosis of diabetes    Lab Results  Component Value Date   HGBA1C 5.3 02/06/2024    Your HGBA1c can be lowered with medications, healthy diet, and  exercise. Check your blood sugar as directed by your physician Call your physician if you experience unexplained or low blood sugars.  PHYSICAL ACTIVITY/REHABILITATION Goal is 30 minutes at least 4 days per week  Activity: Increase activity slowly, Therapies: Physical Therapy: Home Health Return to work:  Activity decreases your risk of heart attack and stroke and makes your heart stronger.  It helps control your weight and blood pressure; helps you relax and can improve your mood. Participate in a regular exercise program. Talk with your doctor about the best form of exercise for you (dancing, walking,  swimming, cycling).  DIET/WEIGHT Goal is to maintain a healthy weight  Your discharge diet is:  Diet Order             Diet Heart Room service appropriate? Yes; Fluid consistency: Thin  Diet effective now                   liquids Your height is:  Height: 5\' 9"  (175.3 cm) Your current weight is: Weight: 81 kg Your Body Mass Index (BMI) is:  BMI (Calculated): 26.36 Following the type of diet specifically designed for you will help prevent another stroke. Your goal weight range is:   Your goal Body Mass Index (BMI) is 19-24. Healthy food habits can help reduce 3 risk factors for stroke:  High cholesterol, hypertension, and excess weight.  RESOURCES Stroke/Support Group:  Call [phone]   STROKE EDUCATION PROVIDED/REVIEWED AND GIVEN TO PATIENT Stroke warning signs and symptoms How to activate emergency medical system (call 911). Medications prescribed at discharge. Need for follow-up after discharge. Personal risk factors for stroke. Pneumonia vaccine given: No Flu vaccine given: No My questions have been answered, the writing is legible, and I understand these instructions.  I will adhere to these goals & educational materials that have been provided to me after my discharge from the hospital.      Implantable Cardiac Device Battery Change, Care After  What can I expect after the procedure? After your procedure, it is common to have: Pain or soreness at the site where the cardiac device was inserted. Swelling at the site where the cardiac device was inserted. You should received an information card for your new device in 4-8 weeks.  Follow these instructions at home: Incision care  Keep the incision clean and dry. Do not take baths, swim, or use a hot tub until after your wound check.  Do not shower for at least 7 days, or as directed by your health care provider. Pat the area dry with a clean towel. Do not rub the area. This may cause bleeding. Follow instructions  from your health care provider about how to take care of your incision. Make sure you: If your incision is sealed with Steri-strips or staples, you may shower 7 days after your procedure or when told by your provider. Do not remove the steri-strips or let the shower hit directly on your site. You may wash around your site with soap and water.  Check your incision area every day for signs of infection. Check for: More redness, swelling, or pain. More fluid or blood. Warmth. Pus or a bad smell. Activity Do not lift anything that is heavier than 10 lb (4.5 kg) until your health care provider says it is okay to do so. For the first week, or as long as told by your health care provider: Avoid lifting your affected arm higher than your shoulder. After 1 week, Be gentle when you move  your arms over your head. It is okay to raise your arm to comb your hair. Avoid strenuous exercise. Ask your health care provider when it is okay to: Resume your normal activities. Return to work or school. Resume sexual activity. Eating and drinking Eat a heart-healthy diet. This should include plenty of fresh fruits and vegetables, whole grains, low-fat dairy products, and lean protein like chicken and fish. Limit alcohol intake to no more than 1 drink a day for non-pregnant women and 2 drinks a day for men. One drink equals 12 oz of beer, 5 oz of wine, or 1 oz of hard liquor. Check ingredients and nutrition facts on packaged foods and beverages. Avoid the following types of food: Food that is high in salt (sodium). Food that is high in saturated fat, like full-fat dairy or red meat. Food that is high in trans fat, like fried food. Food and drinks that are high in sugar. Lifestyle Do not use any products that contain nicotine or tobacco, such as cigarettes and e-cigarettes. If you need help quitting, ask your health care provider. Take steps to manage and control your weight. Once cleared, get regular exercise.  Aim for 150 minutes of moderate-intensity exercise (such as walking or yoga) or 75 minutes of vigorous exercise (such as running or swimming) each week. Manage other health problems, such as diabetes or high blood pressure. Ask your health care provider how you can manage these conditions. General instructions Do not drive for 24 hours after your procedure if you were given a medicine to help you relax (sedative). Take over-the-counter and prescription medicines only as told by your health care provider. Avoid putting pressure on the area where the cardiac device was placed. If you need an MRI after your cardiac device has been placed, be sure to tell the health care provider who orders the MRI that you have a cardiac device. Avoid close and prolonged exposure to electrical devices that have strong magnetic fields. These include: Cell phones. Avoid keeping them in a pocket near the cardiac device, and try using the ear opposite the cardiac device. Metal detectors. Electric generators. High-tension wires. Keep all follow-up visits as directed by your health care provider. This is important.  Contact a health care provider if: You have pain at the incision site that is not relieved by over-the-counter or prescription medicines. You have any of these around your incision site or coming from it: More redness, swelling, or pain. Fluid or blood. Warmth to the touch. Pus or a bad smell. You have a fever. You feel brief, occasional palpitations, light-headedness, or any symptoms that you think might be related to your heart.  Get help right away if: You experience chest pain that is different from the pain at the cardiac device site. You develop a red streak that extends above or below the incision site. You experience shortness of breath. You have palpitations or an irregular heartbeat. You have light-headedness that does not go away quickly. You faint or have dizzy spells. Your pulse  suddenly drops or increases rapidly and does not return to normal. You begin to gain weight and your legs and ankles swell. Summary After your procedure, it is common to have pain, soreness, and some swelling where the cardiac device was inserted. Make sure to keep your incision clean and dry. Follow instructions from your health care provider about how to take care of your incision. Check your incision every day for signs of infection, such as more pain or  swelling, pus or a bad smell, warmth, or leaking fluid and blood. Avoid strenuous exercise and lifting your left arm higher than your shoulder for 2 weeks, or as long as told by your health care provider. This information is not intended to replace advice given to you by your health care provider. Make sure you discuss any questions you have with your health care provider.

## 2024-02-11 DIAGNOSIS — Z95811 Presence of heart assist device: Secondary | ICD-10-CM

## 2024-02-11 DIAGNOSIS — I63532 Cerebral infarction due to unspecified occlusion or stenosis of left posterior cerebral artery: Secondary | ICD-10-CM | POA: Diagnosis not present

## 2024-02-11 DIAGNOSIS — G4700 Insomnia, unspecified: Secondary | ICD-10-CM

## 2024-02-11 DIAGNOSIS — F05 Delirium due to known physiological condition: Secondary | ICD-10-CM

## 2024-02-11 DIAGNOSIS — N1832 Chronic kidney disease, stage 3b: Secondary | ICD-10-CM

## 2024-02-11 DIAGNOSIS — K5900 Constipation, unspecified: Secondary | ICD-10-CM

## 2024-02-11 LAB — PROTIME-INR
INR: 2.1 — ABNORMAL HIGH (ref 0.8–1.2)
Prothrombin Time: 23.7 s — ABNORMAL HIGH (ref 11.4–15.2)

## 2024-02-11 LAB — COMPREHENSIVE METABOLIC PANEL WITH GFR
ALT: 13 U/L (ref 0–44)
AST: 20 U/L (ref 15–41)
Albumin: 2.9 g/dL — ABNORMAL LOW (ref 3.5–5.0)
Alkaline Phosphatase: 91 U/L (ref 38–126)
Anion gap: 5 (ref 5–15)
BUN: 23 mg/dL (ref 8–23)
CO2: 25 mmol/L (ref 22–32)
Calcium: 8.6 mg/dL — ABNORMAL LOW (ref 8.9–10.3)
Chloride: 105 mmol/L (ref 98–111)
Creatinine, Ser: 2.2 mg/dL — ABNORMAL HIGH (ref 0.61–1.24)
GFR, Estimated: 31 mL/min — ABNORMAL LOW (ref >60)
Glucose, Bld: 86 mg/dL (ref 70–99)
Potassium: 4.1 mmol/L (ref 3.5–5.1)
Sodium: 135 mmol/L (ref 135–145)
Total Bilirubin: 1.1 mg/dL (ref 0.0–1.2)
Total Protein: 6.1 g/dL — ABNORMAL LOW (ref 6.5–8.1)

## 2024-02-11 LAB — CBC WITH DIFFERENTIAL/PLATELET
Abs Immature Granulocytes: 0.01 10*3/uL (ref 0.00–0.07)
Basophils Absolute: 0 10*3/uL (ref 0.0–0.1)
Basophils Relative: 1 %
Eosinophils Absolute: 0.9 10*3/uL — ABNORMAL HIGH (ref 0.0–0.5)
Eosinophils Relative: 25 %
HCT: 40.8 % (ref 39.0–52.0)
Hemoglobin: 13.4 g/dL (ref 13.0–17.0)
Immature Granulocytes: 0 %
Lymphocytes Relative: 14 %
Lymphs Abs: 0.5 10*3/uL — ABNORMAL LOW (ref 0.7–4.0)
MCH: 29.8 pg (ref 26.0–34.0)
MCHC: 32.8 g/dL (ref 30.0–36.0)
MCV: 90.7 fL (ref 80.0–100.0)
Monocytes Absolute: 0.7 10*3/uL (ref 0.1–1.0)
Monocytes Relative: 18 %
Neutro Abs: 1.5 10*3/uL — ABNORMAL LOW (ref 1.7–7.7)
Neutrophils Relative %: 42 %
Platelets: 197 10*3/uL (ref 150–400)
RBC: 4.5 MIL/uL (ref 4.22–5.81)
RDW: 15 % (ref 11.5–15.5)
WBC: 3.7 10*3/uL — ABNORMAL LOW (ref 4.0–10.5)
nRBC: 0 % (ref 0.0–0.2)

## 2024-02-11 MED ORDER — WARFARIN SODIUM 4 MG PO TABS
4.0000 mg | ORAL_TABLET | Freq: Once | ORAL | Status: AC
  Administered 2024-02-11: 4 mg via ORAL
  Filled 2024-02-11: qty 1

## 2024-02-11 NOTE — Progress Notes (Addendum)
 PROGRESS NOTE   Subjective/Complaints: Poor sleep last night, patient noted to be restless last night some confusion at night.  Patient had not had recent bowel movement when I spoke to him the morning but appears he later had a large BM later today 5/23.  ROS: Patient denies fever, shortness of breath, chest pain, nausea, vomiting. + Leg swelling, constipation, weakness  Objective:   No results found. Recent Labs    02/10/24 0229 02/11/24 0652  WBC 3.6* 3.7*  HGB 12.3* 13.4  HCT 37.8* 40.8  PLT 203 197   Recent Labs    02/10/24 0229 02/11/24 0652  NA 138 135  K 4.2 4.1  CL 110 105  CO2 21* 25  GLUCOSE 105* 86  BUN 27* 23  CREATININE 2.26* 2.20*  CALCIUM  8.6* 8.6*    Intake/Output Summary (Last 24 hours) at 02/11/2024 1757 Last data filed at 02/11/2024 1401 Gross per 24 hour  Intake 596 ml  Output 700 ml  Net -104 ml        Physical Exam: Vital Signs Blood pressure 91/79, pulse 60, temperature 98 F (36.7 C), resp. rate 18, height 5\' 9"  (1.753 m), weight 79.9 kg, SpO2 100%.  General: No apparent distress, appears comfortable laying in bed HEENT: Head is normocephalic, atraumatic, mild right facial weakness, mucous membranes a little dry, wearing glasses Heart: LVAD hum Chest: CTA bilaterally, nonlabored, LVAD dressing CDI Abdomen: Soft, non-tender, mildly distended, hypoactive bowel sounds. Extremities: No clubbing, cyanosis, or edema.  Psych: Pt's affect is flat Skin: Clean and intact without signs of breakdown Neuro: Patient is alert.  Mild left gaze preference.  Follows simple commands Pt has delayed responses and speech Expressive aphasia noted  RUE- 4/5 throughout however has R sided neglect- hard to access LUE- 5/5 except FA 4+/5 as well as grip 5-/5 RLE- HF 2/5; KE/KF 4-/5; DF 4-/5 and PF 4/5 LLE- 5/5 throughout  Exam appears consistent from prior 5/23  MSK: no joint tenderness  noted    Assessment/Plan: 1. Functional deficits which require 3+ hours per day of interdisciplinary therapy in a comprehensive inpatient rehab setting. Physiatrist is providing close team supervision and 24 hour management of active medical problems listed below. Physiatrist and rehab team continue to assess barriers to discharge/monitor patient progress toward functional and medical goals  Care Tool:  Bathing    Body parts bathed by patient: Right arm, Left arm, Chest, Abdomen, Front perineal area, Right upper leg, Left upper leg, Face   Body parts bathed by helper: Buttocks, Right lower leg, Left lower leg     Bathing assist Assist Level: Moderate Assistance - Patient 50 - 74%     Upper Body Dressing/Undressing Upper body dressing   What is the patient wearing?: Pull over shirt, Orthosis (LVAD vest) Orthosis activity level: Performed by helper  Upper body assist Assist Level: Maximal Assistance - Patient 25 - 49%    Lower Body Dressing/Undressing Lower body dressing      What is the patient wearing?: Incontinence brief, Pants     Lower body assist Assist for lower body dressing: Moderate Assistance - Patient 50 - 74%     Toileting Toileting  Toileting assist Assist for toileting: Maximal Assistance - Patient 25 - 49%     Transfers Chair/bed transfer  Transfers assist     Chair/bed transfer assist level: Contact Guard/Touching assist     Locomotion Ambulation   Ambulation assist      Assist level: Contact Guard/Touching assist Assistive device: Walker-rolling Max distance: 90   Walk 10 feet activity   Assist     Assist level: Contact Guard/Touching assist Assistive device: Walker-rolling   Walk 50 feet activity   Assist    Assist level: Contact Guard/Touching assist Assistive device: Walker-rolling    Walk 150 feet activity   Assist Walk 150 feet activity did not occur: Safety/medical concerns         Walk 10 feet on  uneven surface  activity   Assist Walk 10 feet on uneven surfaces activity did not occur: Safety/medical concerns         Wheelchair     Assist Is the patient using a wheelchair?: Yes Type of Wheelchair: Manual Wheelchair activity did not occur: Safety/medical concerns  Wheelchair assist level: Dependent - Patient 0% Max wheelchair distance: 150    Wheelchair 50 feet with 2 turns activity    Assist        Assist Level: Dependent - Patient 0%   Wheelchair 150 feet activity     Assist      Assist Level: Dependent - Patient 0%   Blood pressure 91/79, pulse 60, temperature 98 F (36.7 C), resp. rate 18, height 5\' 9"  (1.753 m), weight 79.9 kg, SpO2 100%.  Medical Problem List and Plan: 1. 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  Antithrombotics: -DVT/anticoagulation:  Pharmaceutical: Coumadin .  Goal INR 2.5-3.0 due to recent CVA             -antiplatelet therapy: N/A 3. Pain Management: Hydrocodone  as needed 4. Mood/Behavior/Sleep: Provide emotional support             -antipsychotic agents: N/A 5. Neuropsych/cognition: This patient is NOT capable of making decisions on his own behalf. 6. Skin/Wound Care: Routine skin checks and LVAD team following for line drive, etc 7. Fluids/Electrolytes/Nutrition: Routine in and outs with follow-up chemistries 8.  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  Continue Revatio .  Followed by heart failure team- appreciate assistance.  Status post generator change 02/09/2024- team wants pressure dressing "for as long as  possible"- due to coumadin  9.  CKD stage III.  Follow-up chemistries  -5/23 BUN and creatinine stable at 2.2/23 10.  Remote tobacco use.  Provide counseling 11.  Hyperlipidemia.  Crestor  12. Severe constipation- will need to order Sorbitol  tomorrow after therapy- since has been at least 8 days since LBM.   - 5/23 LBM today-large recorded 13. Patient wants a haircut- suaully wears hair "bald"- and shaves at home and on coumadin - please evaluate if possible.  14.  Insomnia.  Wife declines any medication changes tonight.  Would consider tomorrow 15.  Sundowning/delirium.  Avoid medications that worsen cognition.  Consider tramadol  HS to help regulate sleep-wake cycle.  His wife asked to hold off on this tonight   -    LOS: 1 days A FACE TO FACE EVALUATION WAS PERFORMED  Lylia Sand 02/11/2024, 5:57 PM

## 2024-02-11 NOTE — Plan of Care (Signed)
  Problem: RH Balance Goal: LTG Patient will maintain dynamic standing with ADLs (OT) Description: LTG:  Patient will maintain dynamic standing balance with assist during activities of daily living (OT)  Flowsheets (Taken 02/11/2024 1225) LTG: Pt will maintain dynamic standing balance during ADLs with: Supervision/Verbal cueing   Problem: Sit to Stand Goal: LTG:  Patient will perform sit to stand in prep for activites of daily living with assistance level (OT) Description: LTG:  Patient will perform sit to stand in prep for activites of daily living with assistance level (OT) Flowsheets (Taken 02/11/2024 1225) LTG: PT will perform sit to stand in prep for activites of daily living with assistance level: Supervision/Verbal cueing   Problem: RH Eating Goal: LTG Patient will perform eating w/assist, cues/equip (OT) Description: LTG: Patient will perform eating with assist, with/without cues using equipment (OT) Flowsheets (Taken 02/11/2024 1225) LTG: Pt will perform eating with assistance level of: Supervision/Verbal cueing   Problem: RH Grooming Goal: LTG Patient will perform grooming w/assist,cues/equip (OT) Description: LTG: Patient will perform grooming with assist, with/without cues using equipment (OT) Flowsheets (Taken 02/11/2024 1225) LTG: Pt will perform grooming with assistance level of: Set up assist    Problem: RH Bathing Goal: LTG Patient will bathe all body parts with assist levels (OT) Description: LTG: Patient will bathe all body parts with assist levels (OT) Flowsheets (Taken 02/11/2024 1225) LTG: Pt will perform bathing with assistance level/cueing: Supervision/Verbal cueing   Problem: RH Dressing Goal: LTG Patient will perform upper body dressing (OT) Description: LTG Patient will perform upper body dressing with assist, with/without cues (OT). Flowsheets (Taken 02/11/2024 1225) LTG: Pt will perform upper body dressing with assistance level of: Set up assist Goal: LTG  Patient will perform lower body dressing w/assist (OT) Description: LTG: Patient will perform lower body dressing with assist, with/without cues in positioning using equipment (OT) Flowsheets (Taken 02/11/2024 1225) LTG: Pt will perform lower body dressing with assistance level of: Supervision/Verbal cueing   Problem: RH Toileting Goal: LTG Patient will perform toileting task (3/3 steps) with assistance level (OT) Description: LTG: Patient will perform toileting task (3/3 steps) with assistance level (OT)  Flowsheets (Taken 02/11/2024 1225) LTG: Pt will perform toileting task (3/3 steps) with assistance level: Supervision/Verbal cueing   Problem: RH Toilet Transfers Goal: LTG Patient will perform toilet transfers w/assist (OT) Description: LTG: Patient will perform toilet transfers with assist, with/without cues using equipment (OT) Flowsheets (Taken 02/11/2024 1225) LTG: Pt will perform toilet transfers with assistance level of: Supervision/Verbal cueing   Problem: RH Awareness Goal: LTG: Patient will demonstrate awareness during functional activites type of (OT) Description: LTG: Patient will demonstrate awareness during functional activites type of (OT) Flowsheets (Taken 02/11/2024 1225) Patient will demonstrate awareness during functional activites type of: Emergent LTG: Patient will demonstrate awareness during functional activites type of (OT): Supervision

## 2024-02-11 NOTE — Progress Notes (Addendum)
 LVAD Coordinator Rounding Note:  Pt admitted 02/05/24 for altered mental status. Code Stroke activated. CT showed Left PCA and left cerebellar infarct. Pt not candidate for thrombolytic therapy or IR due to the occlusion being too distal.  STAT CT head 1. 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 Hyperdense Left PCA suggests occlusion of that vessel. No hemorrhagic transformation or mass effect. 2. Underlying advanced chronic small vessel disease. 3. Right supraorbital/forehead recent soft tissue injury with no skull fracture identified.  Pt transferred to CIR yesterday. Working with speech therapy on my arrival. Pt seems to be in good spirits today.  Goal INR now 2.5-3.0 per Neurology.  Vital signs: Temp: 98 HR: 60 V paced Doppler Pressure: 90 Auto Cuff BP: 103/72 (81) O2 Sat: 98% on RA Wt: 184>183.2>182.9>187.3>178.3>177.9>176.2 lbs    LVAD interrogation reveals:  Speed: 5350 Flow: 4.6 Power: 3.9 w PI: 2.8 Hematocrit: 38  Alarms: none Events: rare  Fixed speed: 5300 Low speed limit: 5000  Drive Line:  Existing VAD dressing C/D/I with anchor intact and accurately applied. Weekly dressing changes per VAD coordinator, or bedside nurse. Next dressing change due: 02/12/24 by caregiver.  Labs:  LDH trend: 161>175>154>169>158  INR trend: 2.2>2.6>2.3>2.7>2.5>2.4>2.1  Anticoagulation Plan: -INR Goal: 2.5-3.0  Device: - Cabin crew - V paced 60 -Therapies: OFF  Infection:   Plan/Recommendations:  Page VAD coordinator for drive line or equipment concerns 2. Weekly dressing changes per VAD Coordinator or caregiver.  Laurice Pope RN,BSN VAD Coordinator  Office: [phone]  24/7 Pager: [phone]

## 2024-02-11 NOTE — Plan of Care (Signed)
  Problem: RH Cognition - SLP Goal: RH LTG Patient will demonstrate orientation with cues Description:  LTG:  Patient will demonstrate orientation to person/place/time/situation with cues (SLP)   Flowsheets (Taken 02/11/2024 1048) LTG Patient will demonstrate orientation to:  Person  Place  Time  Situation LTG: Patient will demonstrate orientation using cueing (SLP): Supervision   Problem: RH Expression Communication Goal: LTG Patient will increase word finding of common (SLP) Description: LTG:  Patient will increase word finding of common objects/daily info/abstract thoughts with cues using compensatory strategies (SLP). Flowsheets (Taken 02/11/2024 1048) LTG: Patient will increase word finding of common (SLP): Supervision Patient will use compensatory strategies to increase word finding of: Daily info   Problem: RH Problem Solving Goal: LTG Patient will demonstrate problem solving for (SLP) Description: LTG:  Patient will demonstrate problem solving for basic/complex daily situations with cues  (SLP) Flowsheets (Taken 02/11/2024 1048) LTG: Patient will demonstrate problem solving for (SLP): Basic daily situations LTG Patient will demonstrate problem solving for: Minimal Assistance - Patient > 75%   Problem: RH Memory Goal: LTG Patient will demonstrate ability for day to day (SLP) Description: LTG:   Patient will demonstrate ability for day to day recall/carryover during cognitive/linguistic activities with assist  (SLP) Flowsheets (Taken 02/11/2024 1048) LTG: Patient will demonstrate ability for day to day recall: New information LTG: Patient will demonstrate ability for day to day recall/carryover during cognitive/linguistic activities with assist (SLP): Minimal Assistance - Patient > 75%   Problem: RH Attention Goal: LTG Patient will demonstrate this level of attention during functional activites (SLP) Description: LTG:  Patient will will demonstrate this level of attention during  functional activites (SLP) Flowsheets (Taken 02/11/2024 1048) Patient will demonstrate during cognitive/linguistic activities the attention type of: Sustained Patient will demonstrate this level of attention during cognitive/linguistic activities in: Controlled LTG: Patient will demonstrate this level of attention during cognitive/linguistic activities with assistance of (SLP): Minimal Assistance - Patient > 75% Number of minutes patient will demonstrate attention during cognitive/linguistic activities: 20

## 2024-02-11 NOTE — Progress Notes (Addendum)
 Inpatient Rehabilitation Admission Medication Review by a Pharmacist  A complete drug regimen review was completed for this patient to identify any potential clinically significant medication issues.  High Risk Drug Classes Is patient taking? Indication by Medication  Antipsychotic No   Anticoagulant Yes Warfarin (LVAD)  Antibiotic No   Opioid No   Antiplatelet No   Hypoglycemics/insulin  No   Vasoactive Medication No   Chemotherapy No   Other Yes Furosemide  (diuresis), protonix  (GERD), crestor  (HLD), sildenafil  (PAH)     Type of Medication Issue Identified Description of Issue Recommendation(s)  Drug Interaction(s) (clinically significant)     Duplicate Therapy     Allergy     No Medication Administration End Date     Incorrect Dose     Additional Drug Therapy Needed     Significant med changes from prior encounter (inform family/care partners about these prior to discharge). PTA medication:  Potassium chloride  (oral) wase discontinued by MD on discharge from [facility] acute hospitalization. Communicate to patient /family/ caregiver prior to discharge.  Other       Clinically significant medication issues were identified that warrant physician communication and completion of prescribed/recommended actions by midnight of the next day:  No  Name of provider notified for urgent issues identified:   Provider Method of Notification:     Pharmacist comments:   Time spent performing this drug regimen review (minutes):  15   Sheron Dixons 02/11/2024 12:50 PM

## 2024-02-11 NOTE — Evaluation (Signed)
 Speech Language Pathology Assessment and Plan  Patient Details  Name: Isaac [name] MRN: [id number] Date of Birth: 12-May-1951  SLP Diagnosis: Aphasia;Cognitive Impairments  Rehab Potential: Good ELOS: 2-2.5 weeks   Today's Date: 02/11/2024 SLP Individual Time: 3086-5784 SLP Individual Time Calculation (min): 54 min  Hospital Problem: Principal Problem:   Acute left PCA stroke (HCC) Active Problems:   LVAD (left ventricular assist device) present (HCC)   Cardioembolic stroke [facility])  Past Medical History:  Past Medical History:  Diagnosis Date   Arrhythmia    atrial fibrillation   CHF (congestive heart failure) (HCC)    Chronic kidney disease    Coronary artery disease    Hyperlipidemia    Hypertension    Myocardial infarct [facility])    Past Surgical History:  Past Surgical History:  Procedure Laterality Date   CARDIAC DEFIBRILLATOR PLACEMENT  feb 2014   ICD GENERATOR CHANGEOUT N/A 02/09/2024   Procedure: ICD GENERATOR CHANGEOUT;  Surgeon: Tammie Fall, MD;  Location[facility] INVASIVE CV LAB;  Service: Cardiovascular;  Laterality: N/A;   INSERTION OF IMPLANTABLE LEFT VENTRICULAR ASSIST DEVICE N/A 09/17/2021   Procedure: INSERTION OF IMPLANTABLE LEFT VENTRICULAR ASSIST DEVICE AND INSERTION OF FEMORAL ARTERIAL LINE;  Surgeon: Bartley Lightning, MD;  Location: [facility] OR;  Service: Open Heart Surgery;  Laterality: N/A;   IR FLUORO GUIDE CV LINE RIGHT  09/27/2021   IR THORACENTESIS ASP PLEURAL SPACE W/IMG GUIDE  09/27/2021   IR US  GUIDE VASC ACCESS RIGHT  09/27/2021   PLACEMENT OF IMPELLA LEFT VENTRICULAR ASSIST DEVICE N/A 09/12/2021   Procedure: PLACEMENT OF IMPELLA 5.5 LEFT VENTRICULAR ASSIST DEVICE;  Surgeon: Zelphia Higashi, MD;  Location: [facility] OR;  Service: Open Heart Surgery;  Laterality: N/A;   RIGHT HEART CATH N/A 09/08/2021   Procedure: RIGHT HEART CATH;  Surgeon: Darlis Eisenmenger, MD;  Location: [facility] INVASIVE CV LAB;  Service: Cardiovascular;  Laterality: N/A;   TEE WITHOUT  CARDIOVERSION N/A 09/12/2021   Procedure: TRANSESOPHAGEAL ECHOCARDIOGRAM (TEE);  Surgeon: Zelphia Higashi, MD;  Location: [facility] OR;  Service: Open Heart Surgery;  Laterality: N/A;   TEE WITHOUT CARDIOVERSION N/A 09/17/2021   Procedure: TRANSESOPHAGEAL ECHOCARDIOGRAM (TEE);  Surgeon: Bartley Lightning, MD;  Location: [facility] OR;  Service: Open Heart Surgery;  Laterality: N/A;    Assessment / Plan / Recommendation Clinical Impression  Isaac [name] is a 73 y.o. with history of CAD, HTN, atrial fibrillation, CKD IV, VT and chronic systolic CHFs/p HM-3 LVAD on 09/17/21.  He presented to the [facility] ED on 02/05/24 for focal neuro deficits with R vision field cut and dysmetria. Code Stroke activated. CT brain positive for acute to subacute appearing Left PCA and Left cerebellar infarcts. Hyperdense Left PCA suggests occlusion of that vessel. No hemorrhagic transformation or mass effect. Seen by Neuro and decision made to treat with IV heparin . Echocardiogram shows EF 25 to 30% with LVAD at the apex.  Left atrial is moderately dilate. CT angio distal left P2/P3 occlusion.Repeat Ct head 02/06/24 with evolving left cerebellar, left PCA and right frontal gyrus MCA branch infarct. No hemorrhage.  Neurology feels infarcts 2/2 LVAD and signed off. Pt. Seen by PT/OT/SLP and they recommended CIR to assist return to PLOF.   Cognitive-Linguistic Evaluation: Patient exhibits deficits in the following domains as exhibited across cognitive-linguistic evaluation: memory, problem solving, attention, orientation, emergent awareness, right inattention, and receptive/expressive language. Patient's language appears to have improved compared to presentation on acute care, however continues to demonstrate instances of  semantic paraphasias such as "pull up your clothes and sit down" instead of "pull up a chair" and "you spent those apples" instead of "you bought those apples." Patient exhibited strength during confrontation  naming task but exhibited significant difficulty during generative naming and responsive naming. Patient is oriented to year but not month/date. Oriented to day of the week, disoriented to state and personal address. Patient required min assist to sustain attention to tasks during patient interview, mod assist to selectively attend when distractions present in the room and min assist for working memory when asked questions about family ages and dynamics. Patient demonstrates awareness to deficits by pointing out difficulties during answering questions. During clock drawing task, patient exhibits confusion, asking "Do you want a clock with 3's on it or a clock with 4's on it?" Required cuing to copy clock present in room to clear up confusion. Patient then benefited from max assist to complete clock including increased cuing to attend to the right. No motor speech deficits appreciated and wife reports no overt changes to swallowing function since clearance for regular/thin liquid diet on acute care. Patient would benefit from continued SLP services to target all aforementioned deficits. Patient was left in lowered bed with call bell in reach and bed alarm set. SLP will continue to target goals per plan of care.     Skilled Therapeutic Interventions          SLP conducted skilled evaluation session to assess cognitive-linguistic function. Utilized appropriate portions of SLUMS examination, informal functional measures, and patient and/or family interview. Full results above.   SLP Assessment  Patient will need skilled Speech Lanaguage Pathology Services during CIR admission    Recommendations  Recommendations for Other Services: Neuropsych consult;Therapeutic Recreation consult Therapeutic Recreation Interventions: Pet therapy Patient destination: Home Follow up Recommendations: Outpatient SLP;24 hour supervision/assistance;Home Health SLP Equipment Recommended: None recommended by SLP    SLP Frequency 3  to 5 out of 7 days   SLP Duration  SLP Intensity  SLP Treatment/Interventions 2-2.5 weeks  Minumum of 1-2 x/day, 30 to 90 minutes  Functional tasks;Cognitive remediation/compensation;Environmental controls;Multimodal communication approach;Speech/Language facilitation;Therapeutic Activities;Cueing hierarchy;Internal/external aids;Patient/family education    Pain Pain Assessment Pain Scale: 0-10 Pain Score: 0-No pain  Prior Functioning Cognitive/Linguistic Baseline: Within functional limits Type of Home: House  Lives With: Spouse Available Help at Discharge: Family Education: 12th grade Vocation: Retired  SLP Evaluation Cognition Overall Cognitive Status: Impaired/Different from baseline Arousal/Alertness: Awake/alert Orientation Level: Oriented to person;Oriented to place;Disoriented to time;Disoriented to situation Year: 2025 Month: August Day of Week: Correct Attention: Focused;Sustained;Selective Focused Attention: Appears intact Sustained Attention: Impaired Sustained Attention Impairment: Verbal basic;Functional basic Selective Attention: Impaired Selective Attention Impairment: Verbal basic;Functional basic Memory: Impaired Memory Impairment: Storage deficit;Retrieval deficit Awareness: Impaired Awareness Impairment: Intellectual impairment;Emergent impairment;Anticipatory impairment Problem Solving: Impaired Problem Solving Impairment: Functional basic;Verbal basic Executive Function: Sequencing;Self Monitoring;Self Correcting Sequencing: Impaired Sequencing Impairment: Verbal basic;Functional basic Self Monitoring: Impaired Self Monitoring Impairment: Verbal basic;Functional basic Self Correcting: Impaired Self Correcting Impairment: Verbal basic;Functional basic Safety/Judgment: Impaired  Comprehension Auditory Comprehension Overall Auditory Comprehension: Impaired (intact for basic tasks, needs further testing to determine cognitive deficits vs. complex  language comprehension deficits) Visual Recognition/Discrimination Common Objects: Able for objects in room Expression Expression Primary Mode of Expression: Verbal Verbal Expression Overall Verbal Expression: Impaired Initiation: No impairment Automatic Speech: Name;Social Response Level of Generative/Spontaneous Verbalization: Phrase;Sentence Repetition: No impairment Naming: Impairment Responsive: Other (comment) (requires increased time and mod cues) Confrontation: Within functional limits Common Objects: Able for objects  in room Convergent: Not tested Divergent: 50-74% accurate Verbal Errors: Semantic paraphasias Pragmatics: No impairment Interfering Components: Attention Effective Techniques: Phonemic cues Non-Verbal Means of Communication: Not applicable Written Expression Dominant Hand: Right Oral Motor Oral Motor/Sensory Function Overall Oral Motor/Sensory Function: Within functional limits Motor Speech Overall Motor Speech: Appears within functional limits for tasks assessed  Care Tool Care Tool Cognition Ability to hear (with hearing aid or hearing appliances if normally used Ability to hear (with hearing aid or hearing appliances if normally used): 0. Adequate - no difficulty in normal conservation, social interaction, listening to TV   Expression of Ideas and Wants Expression of Ideas and Wants: 3. Some difficulty - exhibits some difficulty with expressing needs and ideas (e.g, some words or finishing thoughts) or speech is not clear   Understanding Verbal and Non-Verbal Content Understanding Verbal and Non-Verbal Content: 3. Usually understands - understands most conversations, but misses some part/intent of message. Requires cues at times to understand  Memory/Recall Ability Memory/Recall Ability : That he or she is in a hospital/hospital unit;Current season   Oral Care Assessment Oral Assessment  (WDL): Exceptions to WDL Level of Consciousness: Alert Is  patient on any of following O2 devices?: None of the above Nutritional status: No high risk factors Oral Assessment Risk : Low Risk  Short Term Goals: Week 1: SLP Short Term Goal 1 (Week 1): Patient will orient to date and situation in 4/5 opportunities using external aids as needed when provided with mod assist. SLP Short Term Goal 2 (Week 1): Patient will attend to right side of functional tasks with visual components in 80% of trials given mod assist. SLP Short Term Goal 3 (Week 1): Patient will attend to functional therapy tasks for 10 minutes given overall mod assist throughout. SLP Short Term Goal 4 (Week 1): Patient will utilize basic word finding strategies during generative and responsive naming tasks to name items with 80% accuracy given mod assist. SLP Short Term Goal 5 (Week 1): Patient will solve basic functional problems with 80% accuracy given mod assist.  Refer to Care Plan for Long Term Goals  Recommendations for other services: Neuropsych  Discharge Criteria: Patient will be discharged from SLP if patient refuses treatment 3 consecutive times without medical reason, if treatment goals not met, if there is a change in medical status, if patient makes no progress towards goals or if patient is discharged from hospital.  The above assessment, treatment plan, treatment alternatives and goals were discussed and mutually agreed upon: by patient  Vylet Maffia, M.A., CCC-SLP  Feliberto Hopping 02/11/2024, 11:47 AM

## 2024-02-11 NOTE — Progress Notes (Addendum)
 Advanced Heart Failure Rounding Note  Cardiologist: Peder Bourdon, MD  Chief Complaint: Acute CVA Subjective:    Now admitted to CIR. Continues to sundown overnight. Wife is concerned it is medication related, medication list reviewed. No altering medications given.  MAP 80s. INR 2.1  Sitting in bed. Wife at bedside. No SOB. Not having shoulder pain. Now working with PT.   LVAD Interrogation HM III: Speed: 5300 Flow: 4.6 PI: 3.0 Power: 3.9. No PI events  Objective:    Weight Range: 79.9 kg Body mass index is 26.01 kg/m.   Vital Signs:   Temp:  [98 F (36.7 C)-98.4 F (36.9 C)] 98.4 F (36.9 C) (05/23 0500) Pulse Rate:  [68-158] 89 (05/23 0500) Resp:  [16-18] 16 (05/23 0500) BP: (89-103)/(60-78) 103/72 (05/23 0814) SpO2:  [95 %-98 %] 98 % (05/23 0814) Weight:  [79.9 kg-81 kg] 79.9 kg (05/23 0500) Last BM Date : 02/05/24  Weight change: Filed Weights   02/10/24 1925 02/11/24 0500  Weight: 81 kg 79.9 kg   Intake/Output:  Intake/Output Summary (Last 24 hours) at 02/11/2024 0908 Last data filed at 02/11/2024 0700 Gross per 24 hour  Intake 478 ml  Output 700 ml  Net -222 ml    Physical Exam   General: Daxied appearing. No distress on RA Cardiac: JVP ~7cm. Mechanical heart sounds with LVAD hum present.  Abdomen: Soft, non-tender, non-distended. + BS Driveline: Dressing C/D/I. No drainage or redness. Anchor in place. Extremities: Warm and dry. No peripheral edema. Neuro: Affect flat  Labs    CBC Recent Labs    02/10/24 0229 02/11/24 0652  WBC 3.6* 3.7*  NEUTROABS  --  1.5*  HGB 12.3* 13.4  HCT 37.8* 40.8  MCV 91.5 90.7  PLT 203 197   Basic Metabolic Panel Recent Labs    62/13/08 0229 02/11/24 0652  NA 138 135  K 4.2 4.1  CL 110 105  CO2 21* 25  GLUCOSE 105* 86  BUN 27* 23  CREATININE 2.26* 2.20*  CALCIUM  8.6* 8.6*   Liver Function Tests Recent Labs    02/11/24 0652  AST 20  ALT 13  ALKPHOS 91  BILITOT 1.1  PROT 6.1*  ALBUMIN  2.9*    BNP (last 3 results) Recent Labs    02/05/24 1106  BNP 343.8*   Imaging   No results found.  Medications:    Scheduled Medications:  feeding supplement  237 mL Oral BID BM   [START ON 02/12/2024] furosemide   40 mg Oral QODAY   magnesium  oxide  400 mg Oral Daily   pantoprazole   40 mg Oral Daily   polyethylene glycol  17 g Oral Daily   rosuvastatin   10 mg Oral QHS   senna-docusate  2 tablet Oral BID   sildenafil   20 mg Oral TID   Warfarin - Pharmacist Dosing Inpatient   Does not apply q1600   Infusions:   PRN Medications: acetaminophen , ondansetron , mouth rinse, mouth rinse  Patient Profile   Isaac [name] is a 73 y.o. male with a history of VT, NICM, s/p ICD, LVAD, afib, CKD IV, previously requiring HD, and CAD s/p PCI.   Assessment/Plan   1. Acute CVA: ?embolic - CT brain 5/17: + for acute to subacute L PCA and Left cerebellar infarcts. L PCA occlusion. No hemorrhagic transformation or mass effect.  - CT brain 5/18: evolution of L PCA, L cerebellar and R MCA strokes. No hemorrhage - Echo w/o clear source of embolization - Goal INR increased 2.5-3.0.  Warfarin dosing per PharmD. INR 2.1 - now admitted to CIR; working with PT; weakness improving   2. Chronic systolic CHF:   - Long-standing cardiomyopathy. [employer] ICD s/p gen change 5/21. S/p HM-3 VAD 09/17/21.  - NYHA I at baseline - VAD interrogated personally. Parameters stable. - Euvolemic. Continue PO Lasix  40 mg EOD - Continue sildenafil  20 mg daily for RV dysfunction   3. Tricuspid regurgitation:   - Tricuspid repair not done at time of VAD due to proximity of ICD wires and hypotension during surgery.  TR normal on echo this admission.    4. Chronic Atrial fibrillation: - off amiodarone  with resolution of nausea. - ICD dependent   5. CKD stage IV:  - baseline Scr ~2.5.  - Scr stable - Follows with nephrology.    6. CAD: History of PCI to OM1 in 2007 and RCA in 2013.   - continue  statin   7. GERD with coffee ground emesis - continue PPI  I reviewed the LVAD parameters from today, and compared the results to the patient's prior recorded data.  No programming changes were made.  The LVAD is functioning within specified parameters.  The patient performs LVAD self-test daily.  LVAD interrogation was negative for any significant power changes, alarms or PI events/speed drops.  LVAD equipment check completed and is in good working order.  Back-up equipment present.   LVAD education done on emergency procedures and precautions and reviewed exit site care.  Length of Stay: 1  [place] Lee, NP  02/11/2024, 9:08 AM  VAD Team Pager [phone] (7am - 7am)  Advanced Heart Failure Team Pager [phone] (M-F; 7a - 5p)  Please contact [facility] for night-coverage after hours (5p -7a ) and weekends on [email or web address]  Patient seen and examined with the above-signed Advanced Practice Provider and/or Housestaff. I personally reviewed laboratory data, imaging studies and relevant notes. I independently examined the patient and formulated the important aspects of the plan. I have edited the note to reflect any of my changes or salient points. I have personally discussed the plan with the patient and/or family.  Now in CIR working with rehab. Having some sundowning at night - otherwise improving slowly.   Denies CP or SOB.   INR 2.1. No bleeding  General:  NAD.  HEENT: normal  Neck: supple. JVP not elevated.  Carotids 2+ bilat; no bruits. No lymphadenopathy or thryomegaly appreciated. Cor: LVAD hum.  Lungs: Clear. Abdomen: soft, nontender, non-distended. No hepatosplenomegaly. No bruits or masses. Good bowel sounds. Driveline site clean. Anchor in place.  Extremities: no cyanosis, clubbing, rash. Warm no edema  Neuro: flat affect with latency weak LLE. Oriented x 2  Stable from HF perspective.   Still with stroke deficits. Working with CIR.   INR 2.1 Goal 2.5-3.0 Discussed  warfarin dosing with PharmD personally.  VAD interrogated personally. Parameters stable.  Appreciate CIR staff.   Jules Oar, MD  9:51 AM

## 2024-02-11 NOTE — Plan of Care (Signed)
   Problem: Education: Goal: Patient will understand all VAD equipment and how it functions Outcome: Progressing   Problem: Education: Goal: Patient will be able to verbalize current INR target range and antiplatelet therapy for discharge home Outcome: Progressing   Problem: Cardiac: Goal: LVAD will function as expected and patient will experience no clinical alarms Outcome: Progressing

## 2024-02-11 NOTE — Evaluation (Signed)
 Occupational Therapy Assessment and Plan  Patient Details  Name: Isaac [name] MRN: [id number] Date of Birth: 07/22/1951  OT Diagnosis: abnormal posture, acute pain, cognitive deficits, disturbance of vision, muscle weakness (generalized), pain in joint, and swelling of limb Rehab Potential: Rehab Potential (ACUTE ONLY): Good ELOS: 2 weeks   Today's Date: 02/11/2024 OT Individual Time: 0850-1005 OT Individual Time Calculation (min): 75 min     Hospital Problem: Principal Problem:   Acute left PCA stroke (HCC) Active Problems:   LVAD (left ventricular assist device) present (HCC)   Cardioembolic stroke [facility])   Past Medical History:  Past Medical History:  Diagnosis Date   Arrhythmia    atrial fibrillation   CHF (congestive heart failure) (HCC)    Chronic kidney disease    Coronary artery disease    Hyperlipidemia    Hypertension    Myocardial infarct [facility])    Past Surgical History:  Past Surgical History:  Procedure Laterality Date   CARDIAC DEFIBRILLATOR PLACEMENT  feb 2014   ICD GENERATOR CHANGEOUT N/A 02/09/2024   Procedure: ICD GENERATOR CHANGEOUT;  Surgeon: Tammie Fall, MD;  Location: [facility] INVASIVE CV LAB;  Service: Cardiovascular;  Laterality: N/A;   INSERTION OF IMPLANTABLE LEFT VENTRICULAR ASSIST DEVICE N/A 09/17/2021   Procedure: INSERTION OF IMPLANTABLE LEFT VENTRICULAR ASSIST DEVICE AND INSERTION OF FEMORAL ARTERIAL LINE;  Surgeon: Bartley Lightning, MD;  Location: [facility] OR;  Service: Open Heart Surgery;  Laterality: N/A;   IR FLUORO GUIDE CV LINE RIGHT  09/27/2021   IR THORACENTESIS ASP PLEURAL SPACE W/IMG GUIDE  09/27/2021   IR US  GUIDE VASC ACCESS RIGHT  09/27/2021   PLACEMENT OF IMPELLA LEFT VENTRICULAR ASSIST DEVICE N/A 09/12/2021   Procedure: PLACEMENT OF IMPELLA 5.5 LEFT VENTRICULAR ASSIST DEVICE;  Surgeon: Zelphia Higashi, MD;  Location: [facility] OR;  Service: Open Heart Surgery;  Laterality: N/A;   RIGHT HEART CATH N/A 09/08/2021   Procedure: RIGHT HEART  CATH;  Surgeon: Darlis Eisenmenger, MD;  Location: [facility] INVASIVE CV LAB;  Service: Cardiovascular;  Laterality: N/A;   TEE WITHOUT CARDIOVERSION N/A 09/12/2021   Procedure: TRANSESOPHAGEAL ECHOCARDIOGRAM (TEE);  Surgeon: Zelphia Higashi, MD;  Location: [facility] OR;  Service: Open Heart Surgery;  Laterality: N/A;   TEE WITHOUT CARDIOVERSION N/A 09/17/2021   Procedure: TRANSESOPHAGEAL ECHOCARDIOGRAM (TEE);  Surgeon: Bartley Lightning, MD;  Location: [facility] OR;  Service: Open Heart Surgery;  Laterality: N/A;    Assessment & Plan Clinical Impression:  Isaac [name]. Isaac [name] is a 72 year old right-handed male with history of CAD, hypertension, atrial fibrillation, CKD stage IV, tobacco use, VT and chronic systolic congestive heart failure status post HM-3 LVAD for on 09/17/2021 maintained on Coumadin  therapy. Per chart review patient lives with spouse. Two-level home bed and bath upstairs with one-step to entry. Independent prior to admission who walked with Rolling walker prior to admission. Presented 02/05/2024 when his pacemaker went off abruptly resulting in a fall as well as noted right side weakness and aphasia and blurred vision. Cranial CT scan positive for acute to subacute appearing left PCA and left cerebellar infarct. Hyperdense left PCA suggesting occlusion of that vessel. He was also diagnosed with R frontal gyrus MCA infarct. No hemorrhagic transformation or mass effect. Underlying advanced chronic small vessel disease. Right supraorbital/forehead recent soft tissue injury with no skull fracture identified. CTA head and neck demonstrates subtotal occlusion of the left PCA and the P2/P3 junction and additionally moderate to severe bilateral ICA siphon atherosclerosis with at least moderate siphon stenosis  bilaterally. Admission chemistries unremarkable except glucose 106 BUN 42 creatinine 2.48, INR 2.2, troponin 133, BNP 343. Follow-up CT of the head 02/06/2024 expected evolution of left cerebellar and left PCA  infarct no hemorrhage or mass effect. Evolution also of a right MCA middle frontal gyrus infarct. Echocardiogram with ejection fraction of 20 to 25%. The left ventricle demonstrating global hypokinesis. Neurology follow-up patient currently remains on Coumadin  therapy with goal INR 2.5-3.0. With dual-chamber ICD in place status post generator change 02/09/2024. Patient is tolerating a regular consistency diet. Therapy evaluations completed due to patient decreased functional mobility right side weakness and aphasia was admitted for a comprehensive rehab program. Patient transferred to CIR on 02/10/2024 .    Patient currently requires min-mod A with basic self-care skills secondary to muscle weakness, decreased cardiorespiratoy endurance, decreased coordination and decreased motor planning, field cut, decreased initiation, decreased awareness, decreased problem solving, decreased safety awareness, decreased memory, and delayed processing, and decreased standing balance.  Prior to hospitalization, patient could complete self-care independently.  Patient will benefit from skilled intervention to decrease level of assist with basic self-care skills and increase independence with basic self-care skills prior to discharge home with care partner.  Anticipate patient will require 24 hour supervision and follow up home health.  OT - End of Session Activity Tolerance: Tolerates < 10 min activity, no significant change in vital signs Endurance Deficit: Yes OT Assessment Rehab Potential (ACUTE ONLY): Good OT Barriers to Discharge: Home environment access/layout;Incontinence OT Patient demonstrates impairments in the following area(s): Balance;Cognition;Edema;Endurance;Motor;Safety;Sensory;Vision;Skin Integrity OT Basic ADL's Functional Problem(s): Eating;Grooming;Bathing;Dressing;Toileting OT Transfers Functional Problem(s): Toilet;Tub/Shower OT Additional Impairment(s): None OT Plan OT Intensity: Minimum of 1-2  x/day, 45 to 90 minutes OT Frequency: 5 out of 7 days OT Duration/Estimated Length of Stay: 2 weeks OT Treatment/Interventions: Balance/vestibular training;Discharge planning;Pain management;Self Care/advanced ADL retraining;Therapeutic Activities;UE/LE Coordination activities;Cognitive remediation/compensation;Disease mangement/prevention;Functional mobility training;Patient/family education;Skin care/wound managment;Therapeutic Exercise;Visual/perceptual remediation/compensation;DME/adaptive equipment instruction;Neuromuscular re-education;Psychosocial support;UE/LE Strength taining/ROM OT Self Feeding Anticipated Outcome(s): Supervision OT Basic Self-Care Anticipated Outcome(s): Supervision OT Toileting Anticipated Outcome(s): Supervision OT Bathroom Transfers Anticipated Outcome(s): Supervision OT Recommendation Recommendations for Other Services: Therapeutic Recreation consult Therapeutic Recreation Interventions: Pet therapy Patient destination: Home Follow Up Recommendations: 24 hour supervision/assistance;Home health OT Equipment Recommended: To be determined   OT Evaluation Precautions/Restrictions  Precautions Precautions: Fall Precaution/Restrictions Comments: LVAD, R neglect Restrictions Weight Bearing Restrictions Per Provider Order: No Home Living/Prior Functioning Home Living Living Arrangements: Spouse/significant other Available Help at Discharge: Family Type of Home: House Home Access: Stairs to enter [employer] of Steps: 1 door threshold vs step Home Layout: Two level, Bed/bath upstairs, 1/2 bath on main level Alternate Level Stairs-Number of Steps: ~6, a landing, then additional ~6 Alternate Level Stairs-Rails: Left Bathroom Shower/Tub: Health visitor: Standard Bathroom Accessibility: Yes Additional Comments: Has RW and 2 SPC  Lives With: Spouse IADL History Homemaking Responsibilities: Yes Meal Prep Responsibility:  Secondary Homemaking Comments: pt would cut the lawn, garden, work in garage Current License: Yes Mode of TransportationGames developer Education: 12th grade Occupation: Retired Leisure and Hobbies: "take care of the house" Prior Function Level of Independence: Independent with basic ADLs, Independent with gait, Independent with transfers, Independent with homemaking with ambulation  Able to Take Stairs?: Yes Driving: Yes Vocation: Retired Administrator, sports Baseline Vision/History: 1 Wears glasses Ability to See in Adequate Light: 1 Impaired Patient Visual Report: No change from baseline;Other (comment) (wife reports pts eyes were burning this AM) Vision Assessment?: Yes Eye Alignment: Within Functional Limits Ocular Range of Motion: Within  Functional Limits Alignment/Gaze Preference: Head tilt;Gaze left Tracking/Visual Pursuits: Decreased smoothness of eye movement to RIGHT inferior field;Decreased smoothness of eye movement to RIGHT superior field Saccades: Decreased speed of saccadic movement Convergence: Impaired (comment) Visual Fields: Right visual field deficit Perception  Perception: Impaired Perception-Other Comments: R side Praxis Praxis: Impaired Praxis Impairment Details: Initiation;Organization;Motor planning Cognition Cognition Overall Cognitive Status: Impaired/Different from baseline Arousal/Alertness: Awake/alert Orientation Level: Person;Place;Situation Person: Oriented Place: Disoriented Situation: Disoriented Memory: Impaired Memory Impairment: Storage deficit;Retrieval deficit Attention: Focused;Sustained;Selective Focused Attention: Appears intact Sustained Attention: Impaired Sustained Attention Impairment: Verbal basic;Functional basic Selective Attention: Impaired Selective Attention Impairment: Verbal basic;Functional basic Awareness: Impaired Awareness Impairment: Intellectual impairment;Emergent impairment;Anticipatory impairment Problem Solving:  Impaired Problem Solving Impairment: Functional basic;Verbal basic Executive Function: Sequencing;Self Monitoring;Self Correcting Sequencing: Impaired Sequencing Impairment: Verbal basic;Functional basic Self Monitoring: Impaired Self Monitoring Impairment: Verbal basic;Functional basic Self Correcting: Impaired Self Correcting Impairment: Verbal basic;Functional basic Safety/Judgment: Impaired Brief Interview for Mental Status (BIMS) Repetition of Three Words (First Attempt): 1 Temporal Orientation: Year: Missed by 2 to 5 years Temporal Orientation: Month: Nonsensical Temporal Orientation: Day: Correct Recall: "Sock": No, could not recall Recall: "Blue": Yes, after cueing ("a color") Recall: "Bed": No, could not recall BIMS Summary Score: 4 Sensation Sensation Light Touch: Impaired Detail Light Touch Impaired Details: Impaired RUE Hot/Cold: Appears Intact Proprioception: Impaired by gross assessment Stereognosis: Not tested Coordination Gross Motor Movements are Fluid and Coordinated: No Fine Motor Movements are Fluid and Coordinated: No Finger Nose Finger Test: Impaired bilaterally but RUE > LUE Motor  Motor Motor: Other (comment) Motor - Skilled Clinical Observations: R inattention and debility  Trunk/Postural Assessment  Cervical Assessment Cervical Assessment: Exceptions to [facility] (forward head) Thoracic Assessment Thoracic Assessment: Exceptions to [facility] (thoracic rounding) Lumbar Assessment Lumbar Assessment: Exceptions to [facility] (posterior pelvic tilt in sitting) Postural Control Postural Control: Deficits on evaluation Righting Reactions: delayed  Balance Balance Balance Assessed: Yes Static Sitting Balance Static Sitting - Balance Support: Feet supported;No upper extremity supported Static Sitting - Level of Assistance: 5: Stand by assistance (supervision) Dynamic Sitting Balance Dynamic Sitting - Balance Support: Feet supported;No upper extremity  supported Dynamic Sitting - Level of Assistance: 5: Stand by assistance (CGA) Static Standing Balance Static Standing - Balance Support: During functional activity;No upper extremity supported Static Standing - Level of Assistance: 5: Stand by assistance (CGA) Dynamic Standing Balance Dynamic Standing - Balance Support: During functional activity;No upper extremity supported Dynamic Standing - Level of Assistance: 4: Min assist Extremity/Trunk Assessment RUE Assessment RUE Assessment: Exceptions to [facility] Active Range of Motion (AROM) Comments: Limited SF to 100 degrees, WFL distally General Strength Comments: 3+/5 grossly LUE Assessment LUE Assessment: Exceptions to [facility] Active Range of Motion (AROM) Comments: Limited SF 45 degrees, WFL distally General Strength Comments: 2-/5  Care Tool Care Tool Self Care Eating   Eating Assist Level: Minimal Assistance - Patient > 75%    Oral Care    Oral Care Assist Level: Minimal Assistance - Patient > 75%    Bathing   Body parts bathed by patient: Right arm;Left arm;Chest;Abdomen;Front perineal area;Right upper leg;Left upper leg;Face Body parts bathed by helper: Buttocks;Right lower leg;Left lower leg   Assist Level: Moderate Assistance - Patient 50 - 74%    Upper Body Dressing(including orthotics)   What is the patient wearing?: Pull over shirt;Orthosis (LVAD vest) Orthosis activity level: Performed by helper Assist Level: Maximal Assistance - Patient 25 - 49%    Lower Body Dressing (excluding footwear)   What is the patient wearing?: Incontinence  brief;Pants Assist for lower body dressing: Moderate Assistance - Patient 50 - 74%    Putting on/Taking off footwear   What is the patient wearing?: Non-skid slipper socks Assist for footwear: Dependent - Patient 0%       Care Tool Toileting Toileting activity   Assist for toileting: Maximal Assistance - Patient 25 - 49%     Care Tool Bed Mobility Roll left and right activity   Roll  left and right assist level: Minimal Assistance - Patient > 75%    Sit to lying activity   Sit to lying assist level: Minimal Assistance - Patient > 75%    Lying to sitting on side of bed activity   Lying to sitting on side of bed assist level: the ability to move from lying on the back to sitting on the side of the bed with no back support.: Minimal Assistance - Patient > 75%     Care Tool Transfers Sit to stand transfer   Sit to stand assist level: Contact Guard/Touching assist    Chair/bed transfer         Toilet transfer   Assist Level: Minimal Assistance - Patient > 75%     Care Tool Cognition  Expression of Ideas and Wants Expression of Ideas and Wants: 3. Some difficulty - exhibits some difficulty with expressing needs and ideas (e.g, some words or finishing thoughts) or speech is not clear  Understanding Verbal and Non-Verbal Content Understanding Verbal and Non-Verbal Content: 3. Usually understands - understands most conversations, but misses some part/intent of message. Requires cues at times to understand   Memory/Recall Ability Memory/Recall Ability : That he or she is in a hospital/hospital unit;Current season   Refer to Care Plan for Long Term Goals  SHORT TERM GOAL WEEK 1 OT Short Term Goal 1 (Week 1): Pt will complete toilet transfer with CGA using LRAD OT Short Term Goal 2 (Week 1): Pt will visually scan to R side during ADL task with min cues OT Short Term Goal 3 (Week 1): Pt will transfer LVAD from main power source to battery packs with min A OT Short Term Goal 4 (Week 1): Pt will keep LVAD battery/drive line in safe place with min verbal cues about 75% of the time  Recommendations for other services: Therapeutic Recreation  Pet therapy   Skilled Therapeutic Intervention Patient received upright in bed upon therapy arrival with wife present. Pt's wife predominantly answered history questions and very involved. Pt agreeable to participate in OT evaluation.  Education provided on OT purpose, therapy schedule, goals for therapy, and safety policy while in rehab. Discomfort reported in L shoulder with ROM and in front peri region with removal of purewick. Pt pre-medicated. OT offered rest breaks, repositioning for pain reduction.  Patient demonstrates cognitive impairments (4/15 on BIMS), delayed processing, impaired motor planning, sequencing, awareness, R visual field cut and R inattention, along with dynamic standing balance and functional endurance deficits resulting in difficulty completing BADL tasks without increased physical assist. Cues needed throughout for sequencing, safety and body positioning. Pt will benefit from skilled OT services to focus on mentioned deficits. Education provided on purpose of removing purewick during day to retrain bladder/body for functional mobility needed to toilet OOB. Doffed with max A. Transitioned <> EOB with HOB and supervision but assist needed for LVAD battery and drive line as pt with poor awareness of safety with mobility. Donned brief/pants with mod A, stood CGA without AD, and required assist over hips. See below  for further ADL and functional transfer performance. Pt remained upright in bed at conclusion of session with bed alarm on and all needs met at end of session.    ADL ADL Eating: Minimal assistance Where Assessed-Eating: Bed level Grooming: Minimal assistance Where Assessed-Grooming: Edge of bed Upper Body Bathing: Supervision/safety Where Assessed-Upper Body Bathing: Edge of bed Lower Body Bathing: Moderate assistance Where Assessed-Lower Body Bathing: Edge of bed Upper Body Dressing: Minimal assistance Where Assessed-Upper Body Dressing: Edge of bed Lower Body Dressing: Moderate assistance Where Assessed-Lower Body Dressing: Edge of bed Toileting: Moderate assistance Where Assessed-Toileting: Bedside Commode Toilet Transfer: Minimal assistance Toilet Transfer Method: Stand pivot Toilet  Transfer Equipment: Bedside commode Tub/Shower Transfer: Unable to assess Tub/Shower Transfer Method: Unable to assess Film/video editor: Unable to assess [employer] Transfer Method: Unable to assess Mobility  Bed Mobility Bed Mobility: Right Sidelying to Sit;Sit to Supine Right Sidelying to Sit: Minimal Assistance - Patient > 75% Sit to Supine: Supervision/Verbal cueing Transfers Sit to Stand: Contact Guard/Touching assist Stand to Sit: Contact Guard/Touching assist   Discharge Criteria: Patient will be discharged from OT if patient refuses treatment 3 consecutive times without medical reason, if treatment goals not met, if there is a change in medical status, if patient makes no progress towards goals or if patient is discharged from hospital.  The above assessment, treatment plan, treatment alternatives and goals were discussed and mutually agreed upon: by patient and by family  Ruthanna Covert, MS, OTR/L  02/11/2024, 12:17 PM

## 2024-02-11 NOTE — Progress Notes (Signed)
 Pt awake until 2am, nurse observed pt laying with eyes closed, observed respiration even and unlabored. 5am nurse went into room to check vitals, pt restless wanting to get out of bed. Nurse asked where he was going stated, "I dont know" orientated him, pt eventually calmed down. Wife [name] called this am, nurse updated wife. Wife says he seems more confused at night and early in am, more alert and oriented during the day. Wife concerned it is the medication given at hs. Nurse only gave stool soften, crestor , and sildenafil  per orders. No prn given during shift.

## 2024-02-11 NOTE — Evaluation (Addendum)
 Physical Therapy Assessment and Plan  Patient Details  Name: Isaac [name] MRN: [id number] Date of Birth: 03/05/1951  PT Diagnosis: Coordination disorder, Difficulty walking, Impaired cognition, and Impaired sensation Rehab Potential: Good ELOS: 12-14 days   Today's Date: 02/11/2024 PT Individual Time: 1300-1420 PT Individual Time Calculation (min): 80 min    Hospital Problem: Principal Problem:   Acute left PCA stroke (HCC) Active Problems:   LVAD (left ventricular assist device) present (HCC)   Cardioembolic stroke [facility])   Past Medical History:  Past Medical History:  Diagnosis Date   Arrhythmia    atrial fibrillation   CHF (congestive heart failure) (HCC)    Chronic kidney disease    Coronary artery disease    Hyperlipidemia    Hypertension    Myocardial infarct [facility])    Past Surgical History:  Past Surgical History:  Procedure Laterality Date   CARDIAC DEFIBRILLATOR PLACEMENT  feb 2014   ICD GENERATOR CHANGEOUT N/A 02/09/2024   Procedure: ICD GENERATOR CHANGEOUT;  Surgeon: Tammie Fall, MD;  Location: [facility] INVASIVE CV LAB;  Service: Cardiovascular;  Laterality: N/A;   INSERTION OF IMPLANTABLE LEFT VENTRICULAR ASSIST DEVICE N/A 09/17/2021   Procedure: INSERTION OF IMPLANTABLE LEFT VENTRICULAR ASSIST DEVICE AND INSERTION OF FEMORAL ARTERIAL LINE;  Surgeon: Bartley Lightning, MD;  Location: [facility] OR;  Service: Open Heart Surgery;  Laterality: N/A;   IR FLUORO GUIDE CV LINE RIGHT  09/27/2021   IR THORACENTESIS ASP PLEURAL SPACE W/IMG GUIDE  09/27/2021   IR US  GUIDE VASC ACCESS RIGHT  09/27/2021   PLACEMENT OF IMPELLA LEFT VENTRICULAR ASSIST DEVICE N/A 09/12/2021   Procedure: PLACEMENT OF IMPELLA 5.5 LEFT VENTRICULAR ASSIST DEVICE;  Surgeon: Zelphia Higashi, MD;  Location: [facility] OR;  Service: Open Heart Surgery;  Laterality: N/A;   RIGHT HEART CATH N/A 09/08/2021   Procedure: RIGHT HEART CATH;  Surgeon: Darlis Eisenmenger, MD;  Location: [facility] INVASIVE CV LAB;  Service:  Cardiovascular;  Laterality: N/A;   TEE WITHOUT CARDIOVERSION N/A 09/12/2021   Procedure: TRANSESOPHAGEAL ECHOCARDIOGRAM (TEE);  Surgeon: Zelphia Higashi, MD;  Location: [facility] OR;  Service: Open Heart Surgery;  Laterality: N/A;   TEE WITHOUT CARDIOVERSION N/A 09/17/2021   Procedure: TRANSESOPHAGEAL ECHOCARDIOGRAM (TEE);  Surgeon: Bartley Lightning, MD;  Location: [facility] OR;  Service: Open Heart Surgery;  Laterality: N/A;    Assessment & Plan Clinical Impression: Isaac [name] is a 73 year old right-handed male with history of CAD, hypertension, atrial fibrillation, CKD stage IV, tobacco use, VT and chronic systolic congestive heart failure status post HM-3 LVAD for on 09/17/2021 maintained on Coumadin  therapy. Per chart review patient lives with spouse. Two-level home bed and bath upstairs with one-step to entry. Independent prior to admission who walked with Rolling walker prior to admission. Presented 02/05/2024 when his pacemaker went off abruptly resulting in a fall as well as noted right side weakness and aphasia and blurred vision. Cranial CT scan positive for acute to subacute appearing left PCA and left cerebellar infarct. Hyperdense left PCA suggesting occlusion of that vessel. He was also diagnosed with R frontal gyrus MCA infarct. No hemorrhagic transformation or mass effect. Underlying advanced chronic small vessel disease. Right supraorbital/forehead recent soft tissue injury with no skull fracture identified. CTA head and neck demonstrates subtotal occlusion of the left PCA and the P2/P3 junction and additionally moderate to severe bilateral ICA siphon atherosclerosis with at least moderate siphon stenosis bilaterally. Admission chemistries unremarkable except glucose 106 BUN 42 creatinine 2.48, INR 2.2, troponin 133, BNP  343. Follow-up CT of the head 02/06/2024 expected evolution of left cerebellar and left PCA infarct no hemorrhage or mass effect. Evolution also of a right MCA middle frontal  gyrus infarct. Echocardiogram with ejection fraction of 20 to 25%. The left ventricle demonstrating global hypokinesis. Neurology follow-up patient currently remains on Coumadin  therapy with goal INR 2.5-3.0. With dual-chamber ICD in place status post generator change 02/09/2024. Patient is tolerating a regular consistency diet. Therapy evaluations completed due to patient decreased functional mobility right side weakness and aphasia was admitted for a comprehensive rehab program. Patient transferred to CIR on 02/10/2024 .  Patient transferred to CIR on 02/10/2024 .   Patient currently requires min with mobility secondary to impaired timing and sequencing and decreased coordination, decreased awareness, decreased problem solving, decreased safety awareness, decreased memory, and delayed processing, and decreased standing balance and decreased balance strategies.  Prior to hospitalization, patient was independent  with mobility and lived with Spouse in a House home.  Home access is 1 door threshold vs stepStairs to enter.  Patient will benefit from skilled PT intervention to maximize safe functional mobility, minimize fall risk, and decrease caregiver burden for planned discharge home with 24 hour supervision.  Anticipate patient will benefit from follow up OP at discharge.  PT - End of Session Activity Tolerance: Tolerates 10 - 20 min activity with multiple rests Endurance Deficit: Yes PT Assessment Rehab Potential (ACUTE/IP ONLY): Good PT Barriers to Discharge: Home environment access/layout;Wound Care PT Patient demonstrates impairments in the following area(s): Balance;Skin Integrity;Pain;Perception;Endurance;Safety;Motor;Sensory PT Transfers Functional Problem(s): Bed Mobility;Bed to Chair;Car PT Locomotion Functional Problem(s): Ambulation;Wheelchair Mobility;Stairs PT Plan PT Intensity: Minimum of 1-2 x/day ,45 to 90 minutes PT Frequency: 5 out of 7 days PT Duration Estimated Length of Stay:  12-14 days PT Treatment/Interventions: Ambulation/gait training;Cognitive remediation/compensation;Discharge planning;DME/adaptive equipment instruction;Functional mobility training;Pain management;Psychosocial support;Splinting/orthotics;Therapeutic Activities;UE/LE Strength taining/ROM;Visual/perceptual remediation/compensation;Wheelchair propulsion/positioning;UE/LE Coordination activities;Therapeutic Exercise;Stair training;Skin care/wound management;Patient/family education;Neuromuscular re-education;Functional electrical stimulation;Disease management/prevention;Community reintegration;Balance/vestibular training PT Transfers Anticipated Outcome(s): supervision with LRAD PT Locomotion Anticipated Outcome(s): supervision with LRAD PT Recommendation Recommendations for Other Services: None Follow Up Recommendations: Outpatient PT Patient destination: Home Equipment Recommended: To be determined   PT Evaluation Precautions/Restrictions Precautions Precautions: Fall Precaution/Restrictions Comments: LVAD, R neglect, dressing over defibrillator Restrictions Weight Bearing Restrictions Per Provider Order: No General   Vital SignsTherapy Vitals Temp: 98 F (36.7 C) Pulse Rate: 60 Resp: 18 BP: 91/79 Patient Position (if appropriate): Sitting Oxygen Therapy SpO2: 100 % O2 Device: Room Air Pain   Pain Interference Pain Interference Pain Effect on Sleep: 1. Rarely or not at all Pain Interference with Therapy Activities: 1. Rarely or not at all Pain Interference with Day-to-Day Activities: 1. Rarely or not at all Home Living/Prior Functioning Home Living Living Arrangements: Spouse/significant other Available Help at Discharge: Family Type of Home: House Home Access: Stairs to enter [employer] of Steps: 1 door threshold vs step Home Layout: Two level;Bed/bath upstairs;1/2 bath on main level Alternate Level Stairs-Number of Steps: ~6, a landing, then additional  ~6 Alternate Level Stairs-Rails: Left Additional Comments: Has rollator and 2 SPC  Lives With: Spouse Prior Function Level of Independence: Independent with basic ADLs;Independent with gait;Independent with transfers;Independent with homemaking with ambulation  Able to Take Stairs?: Yes Driving: Yes Vocation: Retired Optometrist - History Ability to See in Adequate Light: 1 Impaired Vision - Assessment Eye Alignment: Within Functional Limits Ocular Range of Motion: Within Functional Limits Alignment/Gaze Preference: Head tilt;Gaze left Tracking/Visual Pursuits: Decreased smoothness of eye movement to RIGHT  inferior field;Decreased smoothness of eye movement to RIGHT superior field Saccades: Decreased speed of saccadic movement Convergence: Impaired (comment) Perception Perception: Impaired Preception Impairment Details: Inattention/Neglect;Spatial orientation Praxis Praxis: Impaired Praxis Impairment Details: Initiation;Organization;Motor planning  Cognition Overall Cognitive Status: Impaired/Different from baseline Arousal/Alertness: Awake/alert Orientation Level: Oriented to person;Oriented to place;Disoriented to time;Disoriented to situation Focused Attention: Appears intact Sustained Attention: Impaired Selective Attention: Impaired Memory: Impaired Memory Impairment: Storage deficit;Retrieval deficit Awareness: Impaired Problem Solving: Impaired Problem Solving Impairment: Functional basic;Verbal basic Executive Function: Sequencing;Self Monitoring;Self Correcting Sequencing: Impaired Sequencing Impairment: Verbal basic;Functional basic Self Monitoring: Impaired Self Monitoring Impairment: Verbal basic;Functional basic Self Correcting: Impaired Self Correcting Impairment: Verbal basic;Functional basic Safety/Judgment: Impaired Sensation Sensation Light Touch: Impaired Detail Light Touch Impaired Details: Impaired LLE;Impaired RLE Proprioception:  Impaired by gross assessment Stereognosis: Not tested Additional Comments: Proprioception impaired LLE Coordination Gross Motor Movements are Fluid and Coordinated: No Fine Motor Movements are Fluid and Coordinated: No Finger Nose Finger Test: Impaired bilaterally but RUE > LUE Motor  Motor Motor: Other (comment) Motor - Skilled Clinical Observations: R inattention and debility   Trunk/Postural Assessment  Cervical Assessment Cervical Assessment: Within Functional Limits Thoracic Assessment Thoracic Assessment: Within Functional Limits Lumbar Assessment Lumbar Assessment: Within Functional Limits Postural Control Postural Control: Deficits on evaluation Righting Reactions: delayed  Balance Balance Balance Assessed: Yes Static Sitting Balance Static Sitting - Balance Support: Feet supported;No upper extremity supported Static Sitting - Level of Assistance: 5: Stand by assistance Dynamic Sitting Balance Dynamic Sitting - Balance Support: Feet supported;No upper extremity supported Dynamic Sitting - Level of Assistance: 5: Stand by assistance Static Standing Balance Static Standing - Balance Support: During functional activity;No upper extremity supported Static Standing - Level of Assistance: 5: Stand by assistance Dynamic Standing Balance Dynamic Standing - Balance Support: During functional activity;No upper extremity supported Dynamic Standing - Level of Assistance: 4: Min assist Extremity Assessment      RLE Assessment RLE Assessment: Within Functional Limits General Strength Comments: grossly 4/5 LLE Assessment LLE Assessment: Exceptions to [facility] General Strength Comments: grossly 5/5  Care Tool Care Tool Bed Mobility Roll left and right activity   Roll left and right assist level: Minimal Assistance - Patient > 75%    Sit to lying activity   Sit to lying assist level: Supervision/Verbal cueing    Lying to sitting on side of bed activity   Lying to sitting on  side of bed assist level: the ability to move from lying on the back to sitting on the side of the bed with no back support.: Supervision/Verbal cueing     Care Tool Transfers Sit to stand transfer   Sit to stand assist level: Contact Guard/Touching assist    Chair/bed transfer   Chair/bed transfer assist level: Contact Guard/Touching assist    Car transfer Car transfer activity did not occur: Safety/medical concerns        Care Tool Locomotion Ambulation   Assist level: Contact Guard/Touching assist Assistive device: Walker-rolling Max distance: 90  Walk 10 feet activity   Assist level: Contact Guard/Touching assist Assistive device: Walker-rolling   Walk 50 feet with 2 turns activity   Assist level: Contact Guard/Touching assist Assistive device: Walker-rolling  Walk 150 feet activity Walk 150 feet activity did not occur: Safety/medical concerns      Walk 10 feet on uneven surfaces activity Walk 10 feet on uneven surfaces activity did not occur: Safety/medical concerns      Stairs Stair activity did not occur: Safety/medical concerns  Walk up/down 1 step activity Walk up/down 1 step or curb (drop down) activity did not occur: Safety/medical concerns      Walk up/down 4 steps activity Walk up/down 4 steps activity did not occur: Safety/medical concerns      Walk up/down 12 steps activity Walk up/down 12 steps activity did not occur: Safety/medical concerns      Pick up small objects from floor Pick up small object from the floor (from standing position) activity did not occur: Safety/medical concerns      Wheelchair Is the patient using a wheelchair?: Yes Type of Wheelchair: Manual Wheelchair activity did not occur: Safety/medical concerns Wheelchair assist level: Dependent - Patient 0% Max wheelchair distance: 150  Wheel 50 feet with 2 turns activity   Assist Level: Dependent - Patient 0%  Wheel 150 feet activity   Assist Level: Dependent - Patient 0%     Refer to Care Plan for Long Term Goals  SHORT TERM GOAL WEEK 1 PT Short Term Goal 1 (Week 1): pt will navigate stairs with CGA PT Short Term Goal 2 (Week 1): Pt will require min a for LVAD batteries PT Short Term Goal 3 (Week 1): Pt will ambulate >150 ft with RW  Recommendations for other services: None   Skilled Therapeutic Intervention Evaluation completed (see details above) with patient education regarding purpose of PT evaluation, PT POC and goals, therapy schedule, weekly team meetings, and other CIR information including safety plan and fall risk safety. Pt reports no pain at this time. Pt performed the below functional mobility tasks with the specified levels of skilled cuing and assistance. Therapist retrieved and set up appropriate equipment during session. Pt remained seated in w/c at end of session, was left with all needs in reach and alarm active.   Mobility Bed Mobility Bed Mobility: Right Sidelying to Sit;Sit to Supine Right Sidelying to Sit: Minimal Assistance - Patient > 75% Sit to Supine: Supervision/Verbal cueing Transfers Transfers: Sit to Stand;Stand Pivot Transfers;Stand to Sit Sit to Stand: Contact Guard/Touching assist Stand to Sit: Contact Guard/Touching assist Stand Pivot Transfers: Minimal Assistance - Patient > 75% Stand Pivot Transfer Details: Verbal cues for technique;Verbal cues for sequencing Transfer (Assistive device): Rolling walker Locomotion  Gait Ambulation: Yes Gait Assistance: Contact Guard/Touching assist Gait Distance (Feet): 90 Feet Assistive device: Rolling walker Gait Gait: Yes Gait Pattern: Impaired Gait Pattern: Trunk flexed Gait velocity: dec Stairs / Additional Locomotion Stairs: No Wheelchair Mobility Wheelchair Mobility: No   Discharge Criteria: Patient will be discharged from PT if patient refuses treatment 3 consecutive times without medical reason, if treatment goals not met, if there is a change in medical status,  if patient makes no progress towards goals or if patient is discharged from hospital.  The above assessment, treatment plan, treatment alternatives and goals were discussed and mutually agreed upon: by patient  Tex Filbert 02/11/2024, 4:17 PM

## 2024-02-11 NOTE — Progress Notes (Signed)
 Inpatient Rehabilitation Center Individual Statement of Services  Patient Name:  Isaac [name]  Date:  02/11/2024  Welcome to the Inpatient Rehabilitation Center.  Our goal is to provide you with an individualized program based on your diagnosis and situation, designed to meet your specific needs.  With this comprehensive rehabilitation program, you will be expected to participate in at least 3 hours of rehabilitation therapies Monday-Friday, with modified therapy programming on the weekends.  Your rehabilitation program will include the following services:  Physical Therapy (PT), Occupational Therapy (OT), Speech Therapy (ST), 24 hour per day rehabilitation nursing, Neuropsychology, Care Coordinator, Rehabilitation Medicine, Nutrition Services, and Pharmacy Services  Weekly team conferences will be held on Wednesday to discuss your progress.  Your Inpatient Rehabilitation Care Coordinator will talk with you frequently to get your input and to update you on team discussions.  Team conferences with you and your family in attendance may also be held.  Expected length of stay: 2 weeks  Overall anticipated outcome: supervision with cues  Depending on your progress and recovery, your program may change. Your Inpatient Rehabilitation Care Coordinator will coordinate services and will keep you informed of any changes. Your Inpatient Rehabilitation Care Coordinator's name and contact numbers are listed  below.  The following services may also be recommended but are not provided by the Inpatient Rehabilitation Center:  Home Health Rehabiltiation Services Outpatient Rehabilitation Services    Arrangements will be made to provide these services after discharge if needed.  Arrangements include referral to agencies that provide these services.  Your insurance has been verified to be:  [employer] Your primary doctor is:  Lyle San  Pertinent information will be shared with your doctor and  your insurance company.  Inpatient Rehabilitation Care Coordinator:  Adrianna Albee, [place] [phone] or [phone]  Information discussed with and copy given to patient by: Mardell Shade, 02/11/2024, 1:25 PM

## 2024-02-11 NOTE — Progress Notes (Signed)
 PHARMACY - ANTICOAGULATION CONSULT NOTE  Pharmacy Consult for warfarin  Indication: LVAD, new acute ischemic infarct   Allergies  Allergen Reactions   Mushroom Extract Complex (Obsolete) Nausea And Vomiting   Doxycycline  Nausea And Vomiting   Neosporin [Neomycin-Bacitracin Zn-Polymyx] Hives   Tape Other (See Comments)    Some tapes/dressings can irritate the skin    Patient Measurements: Height: 5\' 9"  (175.3 cm) Weight: 79.9 kg (176 lb 2.4 oz) IBW/kg (Calculated) : 70.7 HEPARIN  DW (KG): 81  Vital Signs: Temp: 98.4 F (36.9 C) (05/23 0500) Temp Source: Oral (05/23 0500) BP: 103/72 (05/23 0814) Pulse Rate: 89 (05/23 0500)  Labs: Recent Labs    02/09/24 0240 02/10/24 0229 02/11/24 0652  HGB 13.0 12.3* 13.4  HCT 39.9 37.8* 40.8  PLT 178 203 197  LABPROT 27.4* 26.3* 23.7*  INR 2.5* 2.4* 2.1*  CREATININE 2.17* 2.26* 2.20*    Estimated Creatinine Clearance: 30.8 mL/min (A) (by C-G formula based on SCr of 2.2 mg/dL (H)).   Medical History: Past Medical History:  Diagnosis Date   Arrhythmia    atrial fibrillation   CHF (congestive heart failure) (HCC)    Chronic kidney disease    Coronary artery disease    Hyperlipidemia    Hypertension    Myocardial infarct [facility])    Assessment: [name] with HM LVAD on warfarin PTA admitted with acute CVA. Home warfarin regimen is 4mg  Mon/2mg  AODs with INR goal 2-2.5. Per HF and neurology, increase INR goal to 2.5-3 for now. Pt previously on heparin  briefly this admit.  Pt s/p gen change, needed INR on lower end of range per EP request for the procedure. INR today is 2.1, CBC and LDH ok  Goal of Therapy:  INR 2.5-3 Monitor platelets by anticoagulation protocol: Yes   Plan:  Warfarin 4mg  x1 tonight Daily INR   Levin Reamer, PharmD, BCPS, [facility] Clinical Pharmacist [phone] Please check AMION for all [facility] Pharmacy numbers 02/11/2024

## 2024-02-11 NOTE — Progress Notes (Signed)
 Inpatient Rehabilitation Care Coordinator Assessment and Plan Patient Details  Name: Isaac [name] MRN: [id number] Date of Birth: 1950/09/29  Today's Date: 02/11/2024  Hospital Problems: Principal Problem:   Acute left PCA stroke [facility]) Active Problems:   LVAD (left ventricular assist device) present (HCC)   Cardioembolic stroke [facility])  Past Medical History:  Past Medical History:  Diagnosis Date   Arrhythmia    atrial fibrillation   CHF (congestive heart failure) (HCC)    Chronic kidney disease    Coronary artery disease    Hyperlipidemia    Hypertension    Myocardial infarct [facility])    Past Surgical History:  Past Surgical History:  Procedure Laterality Date   CARDIAC DEFIBRILLATOR PLACEMENT  feb 2014   ICD GENERATOR CHANGEOUT N/A 02/09/2024   Procedure: ICD GENERATOR CHANGEOUT;  Surgeon: Tammie Fall, MD;  Location: [facility] INVASIVE CV LAB;  Service: Cardiovascular;  Laterality: N/A;   INSERTION OF IMPLANTABLE LEFT VENTRICULAR ASSIST DEVICE N/A 09/17/2021   Procedure: INSERTION OF IMPLANTABLE LEFT VENTRICULAR ASSIST DEVICE AND INSERTION OF FEMORAL ARTERIAL LINE;  Surgeon: Bartley Lightning, MD;  Location: [facility] OR;  Service: Open Heart Surgery;  Laterality: N/A;   IR FLUORO GUIDE CV LINE RIGHT  09/27/2021   IR THORACENTESIS ASP PLEURAL SPACE W/IMG GUIDE  09/27/2021   IR US  GUIDE VASC ACCESS RIGHT  09/27/2021   PLACEMENT OF IMPELLA LEFT VENTRICULAR ASSIST DEVICE N/A 09/12/2021   Procedure: PLACEMENT OF IMPELLA 5.5 LEFT VENTRICULAR ASSIST DEVICE;  Surgeon: Zelphia Higashi, MD;  Location: [facility] OR;  Service: Open Heart Surgery;  Laterality: N/A;   RIGHT HEART CATH N/A 09/08/2021   Procedure: RIGHT HEART CATH;  Surgeon: Darlis Eisenmenger, MD;  Location: [facility] INVASIVE CV LAB;  Service: Cardiovascular;  Laterality: N/A;   TEE WITHOUT CARDIOVERSION N/A 09/12/2021   Procedure: TRANSESOPHAGEAL ECHOCARDIOGRAM (TEE);  Surgeon: Zelphia Higashi, MD;  Location: [facility] OR;  Service: Open Heart  Surgery;  Laterality: N/A;   TEE WITHOUT CARDIOVERSION N/A 09/17/2021   Procedure: TRANSESOPHAGEAL ECHOCARDIOGRAM (TEE);  Surgeon: Bartley Lightning, MD;  Location: [facility] OR;  Service: Open Heart Surgery;  Laterality: N/A;   Social History:  reports that he has quit smoking. He has never used smokeless tobacco. He reports that he does not drink alcohol and does not use drugs.  Family / Support Systems Marital Status: Married Patient Roles: Spouse, Parent, Other (Comment) (retiree) Spouse/Significant Other: [name] [phone] Children: [name] [phone]  [name] [phone]  [name] [phone] Other Supports: neighbors and friends Anticipated Caregiver: Wife Ability/Limitations of Caregiver: in good health and able to assist if needed Caregiver Availability: 24/7 Family Dynamics: Close with children and friends, he was very independent and active prior to this and wants to get back to this level. He enjoys life and does not want to burdne others  Social History Preferred language: English Religion:  Cultural Background: NA Education: HS Health Literacy - How often do you need to have someone help you when you read instructions, pamphlets, or other written material from your doctor or pharmacy?: Never Writes: Yes Employment Status: Retired Marine scientist Issues: NA Guardian/Conservator: None-according to MD pt is not fully capable of making his own decisions at this time. Will look toward his wfife for any decisions while here   Abuse/Neglect Abuse/Neglect Assessment Can Be Completed: Yes Physical Abuse: Denies Verbal Abuse: Denies Sexual Abuse: Denies Exploitation of patient/patient's resources: Denies Self-Neglect: Denies  Patient response to: Social Isolation - How often do you feel lonely or isolated  from those around you?: Never  Emotional Status Pt's affect, behavior and adjustment status: Pt is motivated to recover and regain his independence, he is  not one to rely upon others and likes being very active and outside. He will do his best here. Wife is present at his bedside. Recent Psychosocial Issues: other health issues-LVAD 2022 Psychiatric History: No history or issues. He has always relied upon himself and taken care of himself he hopes to get back to this level. Substance Abuse History: NA  Patient / Family Perceptions, Expectations & Goals Pt/Family understanding of illness & functional limitations: Pt and wife can explain his strokes and deficits, wife speaks with the MD's involved and feels understands his plan moving forward and hopeful he wil do well while here on rehab. Premorbid pt/family roles/activities: husband, father, grandfather, retiree, friend, neighbor, etc Anticipated changes in roles/activities/participation: resume Pt/family expectations/goals: Pt states: " I want to do well here."  Wife states: " He will do well he wants to be independent again."  [employer]: None Premorbid Home Care/DME Agencies: Other (Comment) (rollator, rw, bsc) Transportation available at discharge: wife Is the patient able to respond to transportation needs?: Yes In the past 12 months, has lack of transportation kept you from medical appointments or from getting medications?: No In the past 12 months, has lack of transportation kept you from meetings, work, or from getting things needed for daily living?: No Resource referrals recommended: Neuropsychology  Discharge Planning Living Arrangements: Spouse/significant other Support Systems: Spouse/significant other, Children, Friends/neighbors Type of Residence: Private residence Insurance Resources: Media planner (specify) Radiographer, therapeutic) Financial Resources: Tree surgeon, Family Support Financial Screen Referred: No Living Expenses: Lives with family Money Management: Patient, Spouse Does the patient have any problems obtaining your medications?:  No Home Management: wife and pt Patient/Family Preliminary Plans: Return home with wife who is able to assist if needed. Wife is here daily and observes in therapies, aware being evaluated today and goals being set for stay here. Aware team conference on Wednesday will update then. Care Coordinator Barriers to Discharge: Insurance for SNF coverage Care Coordinator Anticipated Follow Up Needs: HH/OP  Clinical Impression Pleasant gentleman who is motivated to do well and wants to recover from his strokes. His wife is involved and here daily to provide support to pt. Will update team conference on Wed, aware setting goals today.   Mardell Shade 02/11/2024, 1:24 PM

## 2024-02-12 DIAGNOSIS — K5901 Slow transit constipation: Secondary | ICD-10-CM

## 2024-02-12 DIAGNOSIS — N183 Chronic kidney disease, stage 3 unspecified: Secondary | ICD-10-CM

## 2024-02-12 DIAGNOSIS — I959 Hypotension, unspecified: Secondary | ICD-10-CM | POA: Diagnosis not present

## 2024-02-12 DIAGNOSIS — Z95811 Presence of heart assist device: Secondary | ICD-10-CM | POA: Diagnosis not present

## 2024-02-12 DIAGNOSIS — I63532 Cerebral infarction due to unspecified occlusion or stenosis of left posterior cerebral artery: Secondary | ICD-10-CM | POA: Diagnosis not present

## 2024-02-12 LAB — CBC
HCT: 39.8 % (ref 39.0–52.0)
Hemoglobin: 13.1 g/dL (ref 13.0–17.0)
MCH: 29.9 pg (ref 26.0–34.0)
MCHC: 32.9 g/dL (ref 30.0–36.0)
MCV: 90.9 fL (ref 80.0–100.0)
Platelets: 226 10*3/uL (ref 150–400)
RBC: 4.38 MIL/uL (ref 4.22–5.81)
RDW: 14.7 % (ref 11.5–15.5)
WBC: 3.8 10*3/uL — ABNORMAL LOW (ref 4.0–10.5)
nRBC: 0 % (ref 0.0–0.2)

## 2024-02-12 LAB — BASIC METABOLIC PANEL WITH GFR
Anion gap: 9 (ref 5–15)
BUN: 26 mg/dL — ABNORMAL HIGH (ref 8–23)
CO2: 23 mmol/L (ref 22–32)
Calcium: 8.9 mg/dL (ref 8.9–10.3)
Chloride: 105 mmol/L (ref 98–111)
Creatinine, Ser: 2.22 mg/dL — ABNORMAL HIGH (ref 0.61–1.24)
GFR, Estimated: 31 mL/min — ABNORMAL LOW (ref >60)
Glucose, Bld: 100 mg/dL — ABNORMAL HIGH (ref 70–99)
Potassium: 4.1 mmol/L (ref 3.5–5.1)
Sodium: 137 mmol/L (ref 135–145)

## 2024-02-12 LAB — PROTIME-INR
INR: 2 — ABNORMAL HIGH (ref 0.8–1.2)
Prothrombin Time: 23.2 s — ABNORMAL HIGH (ref 11.4–15.2)

## 2024-02-12 MED ORDER — WARFARIN SODIUM 5 MG PO TABS
5.0000 mg | ORAL_TABLET | Freq: Once | ORAL | Status: AC
  Administered 2024-02-12: 5 mg via ORAL
  Filled 2024-02-12: qty 1

## 2024-02-12 NOTE — Progress Notes (Signed)
 Physical Therapy Session Note  Patient Details  Name: Isaac [name] MRN: [id number] Date of Birth: September 27, 1950  Today's Date: 02/12/2024 PT Individual Time: 0901-0956 PT Individual Time Calculation (min): 55 min   Short Term Goals: Week 1:  PT Short Term Goal 1 (Week 1): pt will navigate stairs with CGA PT Short Term Goal 2 (Week 1): Pt will require min a for LVAD batteries PT Short Term Goal 3 (Week 1): Pt will ambulate >150 ft with RW  Skilled Therapeutic Interventions/Progress Updates:   Received pt semi-reclined in bed with wife at bedside. Pt agreeable to PT treatment and denied any pain during session - RN present to administer medication. Session with emphasis on functional mobility/transfers, dressing, generalized strengthening and endurance, dynamic standing balance/coordination, NMR, stair navigation, and ambulation. Pt transferred semi-reclined<>sitting EOB with HOB elevated and use of bedrails with supervision. Donned underwear and pants sitting EOB with min A to thread RLE through, then stood from EOB without AD and CGA to pull pants over hips.  Pt required max A from wife to switch LVAD from wall unit to portable battery pack. Pt performed all transfers without AD and CGA throughout session. Pt transported to/from room in [facility] dependently for time management purposes. Pt ambulated 221ft without AD and CGA/light min A with cues for sequencing with directions when turning. Ambulated to/from staircase without AD and CGA and navigated 12 6in steps with 1-2 handrails and CGA ascending and descending alternating with step to and step through pattern - pt required cues to ensure R foot completley on step prior to proceeding. Pt then ambulated additional 39ft without AD and CGA. This time pt ambulating at slower cadence with increased R foot drag and reaching out for external support on railing. Returned to room and provided mod A to transition from battery pack to wall unit. Concluded  session with pt sitting in WC, needs within reach, and seatbelt alarm on.   Therapy Documentation Precautions:  Precautions Precautions: Fall Precaution/Restrictions Comments: LVAD, R neglect, dressing over defibrillator Restrictions Weight Bearing Restrictions Per Provider Order: No  Therapy/Group: Individual Therapy [name] [name] PT, DPT 02/12/2024, 7:05 AM

## 2024-02-12 NOTE — Progress Notes (Addendum)
 Patient ID: Isaac [name], male   DOB: [birth year], 73 y.o.   MRN: [id number]     Advanced Heart Failure Rounding Note  Cardiologist: Peder Bourdon, MD  Chief Complaint: Acute CVA Subjective:    Now admitted to CIR. Cognition gradually improving per family, still with confusion at night.  MAP 70s. INR 2.0  Has been up working with PT, getting stronger.    LVAD Interrogation HM III: Speed: 5300 Flow: 4.8 PI: 2.9 Power: 3.9. No PI events  Objective:    Weight Range: 81.1 kg Body mass index is 26.4 kg/m.   Vital Signs:   Temp:  [97.6 F (36.4 C)-98.5 F (36.9 C)] 98.5 F (36.9 C) (05/24 0756) Pulse Rate:  [57-60] 58 (05/24 0756) Resp:  [18-20] 20 (05/24 0756) BP: (84-104)/(38-79) 84/72 (05/24 0756) SpO2:  [97 %-100 %] 99 % (05/24 0756) Weight:  [79.8 kg-81.1 kg] 81.1 kg (05/24 0724) Last BM Date : 02/11/24  Weight change: Filed Weights   02/11/24 0500 02/12/24 0415 02/12/24 0724  Weight: 79.9 kg 79.8 kg 81.1 kg   Intake/Output:  Intake/Output Summary (Last 24 hours) at 02/12/2024 1215 Last data filed at 02/12/2024 0721 Gross per 24 hour  Intake 238 ml  Output 500 ml  Net -262 ml    Physical Exam   General: Well appearing this am. NAD.  HEENT: Normal. Neck: Supple, JVP 8-9 cm. Carotids OK.  Cardiac:  Mechanical heart sounds with LVAD hum present.  Lungs:  CTAB, normal effort.  Abdomen:  NT, ND, no HSM. No bruits or masses. +BS  LVAD exit site: Well-healed and incorporated. Dressing dry and intact. No erythema or drainage. Stabilization device present and accurately applied. Driveline dressing changed daily per sterile technique. Extremities:  Warm and dry. No cyanosis, clubbing, rash, or edema.  Neuro:  Alert & oriented x 3. Right-sided weakness.     Labs    CBC Recent Labs    02/11/24 0652 02/12/24 0730  WBC 3.7* 3.8*  NEUTROABS 1.5*  --   HGB 13.4 13.1  HCT 40.8 39.8  MCV 90.7 90.9  PLT 197 226   Basic Metabolic Panel Recent Labs     02/11/24 0652 02/12/24 0730  NA 135 137  K 4.1 4.1  CL 105 105  CO2 25 23  GLUCOSE 86 100*  BUN 23 26*  CREATININE 2.20* 2.22*  CALCIUM  8.6* 8.9   Liver Function Tests Recent Labs    02/11/24 0652  AST 20  ALT 13  ALKPHOS 91  BILITOT 1.1  PROT 6.1*  ALBUMIN  2.9*   BNP (last 3 results) Recent Labs    02/05/24 1106  BNP 343.8*   Imaging   No results found.  Medications:    Scheduled Medications:  feeding supplement  237 mL Oral BID BM   furosemide   40 mg Oral QODAY   magnesium  oxide  400 mg Oral Daily   pantoprazole   40 mg Oral Daily   polyethylene glycol  17 g Oral Daily   rosuvastatin   10 mg Oral QHS   senna-docusate  2 tablet Oral BID   sildenafil   20 mg Oral TID   Warfarin - Pharmacist Dosing Inpatient   Does not apply q1600   Infusions:   PRN Medications: acetaminophen , ondansetron , mouth rinse, mouth rinse  Patient Profile   Isaac [name] is a 73 y.o. male with a history of VT, NICM, s/p ICD, LVAD, afib, CKD IV, previously requiring HD, and CAD s/p PCI.   Assessment/Plan   1.  Acute CVA: ?embolic - CT brain 5/17: + for acute to subacute L PCA and Left cerebellar infarcts. L PCA occlusion. No hemorrhagic transformation or mass effect.  - CT brain 5/18: evolution of L PCA, L cerebellar and R MCA strokes. No hemorrhage - Echo w/o clear source of embolization - Goal INR increased 2.5-3.0. Warfarin dosing per PharmD. INR 2.0 today.  - now admitted to CIR; working with PT; he is getting stronger.    2. Chronic systolic CHF:   - Long-standing cardiomyopathy. [employer] ICD s/p gen change 5/21. S/p HM-3 VAD 09/17/21.   - NYHA I at baseline - VAD interrogated personally. Stable parameters. - Euvolemic. Continue PO Lasix  40 mg EOD - Continue sildenafil  20 mg daily for RV dysfunction - Pressure dressing remains in place from generator change.    3. Tricuspid regurgitation:   - Tricuspid repair not done at time of VAD due to proximity of  ICD wires and hypotension during surgery.  TR read as normal on echo but I reviewed and suspect that it remains severe.    4. Chronic Atrial fibrillation: - off amiodarone  with resolution of nausea. - ICD dependent   5. CKD stage IV:  - baseline Scr ~2.5.  - Scr stable - Follows with nephrology.    6. CAD: History of PCI to OM1 in 2007 and RCA in 2013.   - continue statin   7. GERD with coffee ground emesis - continue PPI  I reviewed the LVAD parameters from today, and compared the results to the patient's prior recorded data.  No programming changes were made.  The LVAD is functioning within specified parameters.  The patient performs LVAD self-test daily.  LVAD interrogation was negative for any significant power changes, alarms or PI events/speed drops.  LVAD equipment check completed and is in good working order.  Back-up equipment present.   LVAD education done on emergency procedures and precautions and reviewed exit site care.  Length of Stay: 2  Peder Bourdon, MD  02/12/2024, 12:15 PM  VAD Team Pager [phone] (7am - 7am)  Advanced Heart Failure Team Pager [phone] (M-F; 7a - 5p)  Please contact [facility] for night-coverage after hours (5p -7a ) and weekends on [email or web address]

## 2024-02-12 NOTE — IPOC Note (Signed)
 Overall Plan of Care [facility]) Patient Details Name: Isaac [name] MRN: [id number] DOB: Dec 16, 1950  Admitting Diagnosis: Acute left PCA stroke [facility])  Hospital Problems: Principal Problem:   Acute left PCA stroke (HCC) Active Problems:   LVAD (left ventricular assist device) present (HCC)   Cardioembolic stroke (HCC)   Sundowning   Insomnia   Constipation   Stage 3b chronic kidney disease (HCC)     Functional Problem List: Nursing Bladder, Bowel, Pain, Safety, Skin Integrity  PT Balance, Skin Integrity, Pain, Perception, Endurance, Safety, Motor, Sensory  OT Balance, Cognition, Edema, Endurance, Motor, Safety, Sensory, Vision, Skin Integrity  SLP Cognition, Linguistic, Safety  TR         Basic ADL's: OT Eating, Grooming, Bathing, Dressing, Toileting     Advanced  ADL's: OT       Transfers: PT Bed Mobility, Bed to Chair, Customer service manager, Tub/Shower     Locomotion: PT Ambulation, Psychologist, prison and probation services, Stairs     Additional Impairments: OT None  SLP Communication, Social Cognition expression Problem Solving, Memory, Attention, Awareness  TR      Anticipated Outcomes Item Anticipated Outcome  Self Feeding Supervision  Swallowing      Basic self-care  Supervision  Toileting  Supervision   Bathroom Transfers Supervision  Bowel/Bladder  remain continent and maintain regular pattern of emptying  Transfers  supervision with LRAD  Locomotion  supervision with LRAD  Communication  supervision  Cognition  min assist  Pain  less than 3  Safety/Judgment  No falls, skin breakdown or infection while on rehab   Therapy Plan: PT Intensity: Minimum of 1-2 x/day ,45 to 90 minutes PT Frequency: 5 out of 7 days PT Duration Estimated Length of Stay: 12-14 days OT Intensity: Minimum of 1-2 x/day, 45 to 90 minutes OT Frequency: 5 out of 7 days OT Duration/Estimated Length of Stay: 2 weeks SLP Intensity: Minumum of 1-2 x/day, 30 to 90 minutes SLP Frequency: 3 to  5 out of 7 days SLP Duration/Estimated Length of Stay: 2-2.5 weeks   Team Interventions: Nursing Interventions Patient/Family Education, Bladder Management, Bowel Management, Disease Management/Prevention, Medication Management, Skin Care/Wound Management, Psychosocial Support, Discharge Planning  PT interventions Ambulation/gait training, Cognitive remediation/compensation, Discharge planning, DME/adaptive equipment instruction, Functional mobility training, Pain management, Psychosocial support, Splinting/orthotics, Therapeutic Activities, UE/LE Strength taining/ROM, Visual/perceptual remediation/compensation, Wheelchair propulsion/positioning, UE/LE Coordination activities, Therapeutic Exercise, Stair training, Skin care/wound management, Patient/family education, Neuromuscular re-education, Functional electrical stimulation, Disease management/prevention, Firefighter, Warden/ranger  OT Interventions Warden/ranger, Discharge planning, Pain management, Self Care/advanced ADL retraining, Therapeutic Activities, UE/LE Coordination activities, Cognitive remediation/compensation, Disease mangement/prevention, Functional mobility training, Patient/family education, Skin care/wound managment, Therapeutic Exercise, Visual/perceptual remediation/compensation, DME/adaptive equipment instruction, Neuromuscular re-education, Psychosocial support, UE/LE Strength taining/ROM  SLP Interventions Functional tasks, Cognitive remediation/compensation, Environmental controls, Multimodal communication approach, Speech/Language facilitation, Therapeutic Activities, Cueing hierarchy, Internal/external aids, Patient/family education  TR Interventions    SW/CM Interventions Discharge Planning, Psychosocial Support, Patient/Family Education   Barriers to Discharge MD  Medical stability  Nursing Other (comments) LVAD  PT Home environment access/layout, Wound Care    OT Home  environment access/layout, Incontinence    SLP      SW Insurance for SNF coverage     Team Discharge Planning: Destination: PT-Home ,OT- Home , SLP-Home Projected Follow-up: PT-Outpatient PT, OT-  24 hour supervision/assistance, Home health OT, SLP-Outpatient SLP, 24 hour supervision/assistance, Home Health SLP Projected Equipment Needs: PT-To be determined, OT- To be determined, SLP-None recommended by SLP Equipment Details: PT- , OT-  Patient/family involved in discharge planning: PT- Patient, Family member/caregiver,  OT-Patient, Family member/caregiver, SLP-Patient, Family member/caregiver  MD ELOS: 2 weeks Medical Rehab Prognosis:  Good Assessment: The patient has been admitted for CIR therapies with the diagnosis of cardioembolic left cerebellar, PCA and right frontal MCA branch infarct secondary to LVAD . The team will be addressing functional mobility, strength, stamina, balance, safety, adaptive techniques and equipment, self-care, bowel and bladder mgt, patient and caregiver education. Goals have been set at sup. Anticipated discharge destination is home.        See Team Conference Notes for weekly updates to the plan of care

## 2024-02-12 NOTE — Progress Notes (Addendum)
 PROGRESS NOTE   Subjective/Complaints:  Pt doing well, states he slept ok. Pain well managed. LBM yesterday. Urinating fine. Nursing informing me that pt was concerned with swelling at L chest surgery site? But pt denies being concerned. Will bring it up with cardiology when they see him. No other complaints or concerns voiced.   ROS: as per HPI. Denies CP, SOB, abd pain, N/V/D/C, or any other complaints at this time.   + Leg swelling, constipation, weakness  Objective:   No results found. Recent Labs    02/11/24 0652 02/12/24 0730  WBC 3.7* 3.8*  HGB 13.4 13.1  HCT 40.8 39.8  PLT 197 226   Recent Labs    02/11/24 0652 02/12/24 0730  NA 135 137  K 4.1 4.1  CL 105 105  CO2 25 23  GLUCOSE 86 100*  BUN 23 26*  CREATININE 2.20* 2.22*  CALCIUM  8.6* 8.9    Intake/Output Summary (Last 24 hours) at 02/12/2024 1133 Last data filed at 02/12/2024 1610 Gross per 24 hour  Intake 238 ml  Output 500 ml  Net -262 ml        Physical Exam: Vital Signs Blood pressure (!) 84/72, pulse (!) 58, temperature 98.5 F (36.9 C), temperature source Oral, resp. rate 20, height 5\' 9"  (1.753 m), weight 81.1 kg, SpO2 99%.  General: No apparent distress, sitting in w/c talking on the phone, laughing.  HEENT: Head is normocephalic, atraumatic, mild right facial weakness, mucous membranes a little dry, wearing glasses Heart: LVAD hum present Chest: CTA bilaterally, nonlabored, L upper chest dressing CDI but not removed Abdomen: Soft, non-tender, nondistended, normoactive bowel sounds. Extremities: No clubbing, cyanosis, or edema.  Psych: Pt's affect is flat but pleasant Skin: L upper chest bandage as above. L abdomen LVAD drive site c/d/I. Clean and intact without signs of breakdown  PRIOR EXAMS: Neuro: Patient is alert.  Mild left gaze preference.  Follows simple commands Pt has delayed responses and speech Expressive aphasia  noted  RUE- 4/5 throughout however has R sided neglect- hard to access LUE- 5/5 except FA 4+/5 as well as grip 5-/5 RLE- HF 2/5; KE/KF 4-/5; DF 4-/5 and PF 4/5 LLE- 5/5 throughout  Exam appears consistent from prior 5/23  MSK: no joint tenderness noted    Assessment/Plan: 1. Functional deficits which require 3+ hours per day of interdisciplinary therapy in a comprehensive inpatient rehab setting. Physiatrist is providing close team supervision and 24 hour management of active medical problems listed below. Physiatrist and rehab team continue to assess barriers to discharge/monitor patient progress toward functional and medical goals  Care Tool:  Bathing    Body parts bathed by patient: Right arm, Left arm, Chest, Abdomen, Front perineal area, Right upper leg, Left upper leg, Face   Body parts bathed by helper: Buttocks, Right lower leg, Left lower leg     Bathing assist Assist Level: Moderate Assistance - Patient 50 - 74%     Upper Body Dressing/Undressing Upper body dressing   What is the patient wearing?: Pull over shirt, Orthosis (LVAD vest) Orthosis activity level: Performed by helper  Upper body assist Assist Level: Maximal Assistance - Patient 25 - 49%  Lower Body Dressing/Undressing Lower body dressing      What is the patient wearing?: Incontinence brief, Pants     Lower body assist Assist for lower body dressing: Moderate Assistance - Patient 50 - 74%     Toileting Toileting    Toileting assist Assist for toileting: Maximal Assistance - Patient 25 - 49%     Transfers Chair/bed transfer  Transfers assist     Chair/bed transfer assist level: Contact Guard/Touching assist     Locomotion Ambulation   Ambulation assist      Assist level: Minimal Assistance - Patient > 75% Assistive device: No Device Max distance: 338ft   Walk 10 feet activity   Assist     Assist level: Minimal Assistance - Patient > 75% Assistive device: No Device    Walk 50 feet activity   Assist    Assist level: Minimal Assistance - Patient > 75% Assistive device: No Device    Walk 150 feet activity   Assist Walk 150 feet activity did not occur: Safety/medical concerns  Assist level: Minimal Assistance - Patient > 75% Assistive device: No Device    Walk 10 feet on uneven surface  activity   Assist Walk 10 feet on uneven surfaces activity did not occur: Safety/medical concerns         Wheelchair     Assist Is the patient using a wheelchair?: Yes Type of Wheelchair: Manual Wheelchair activity did not occur: Safety/medical concerns  Wheelchair assist level: Dependent - Patient 0% Max wheelchair distance: 150    Wheelchair 50 feet with 2 turns activity    Assist        Assist Level: Dependent - Patient 0%   Wheelchair 150 feet activity     Assist      Assist Level: Dependent - Patient 0%   Blood pressure (!) 84/72, pulse (!) 58, temperature 98.5 F (36.9 C), temperature source Oral, resp. rate 20, height 5\' 9"  (1.753 m), weight 81.1 kg, SpO2 99%.  Medical Problem List and Plan: 1. 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  Antithrombotics: -DVT/anticoagulation:  Pharmaceutical: Coumadin  per pharmacy.  Goal INR 2.5-3.0 due to recent CVA             -antiplatelet therapy: N/A 3. Pain Management: tylenol  PRN 4. Mood/Behavior/Sleep: Provide emotional support             -antipsychotic agents: N/A 5. Neuropsych/cognition: This patient is NOT capable of making decisions on his own behalf. 6. Skin/Wound Care: Routine skin checks and LVAD team following for line  drive, etc -6/96/29 nursing stating pt concerned with L chest wall dressing, but pt states he's not concerned-- LVAD team to address this bandage, it looks fine to me.  7. Fluids/Electrolytes/Nutrition: Routine in and outs with follow-up chemistries, continue vitamins/supplements.  8.  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  Continue Sildenafil  20mg  TID.  Followed by heart failure team- appreciate assistance.  Status post generator change 02/09/2024- team  wants pressure dressing "for as long as possible"- due to coumadin   -Continue Lasix  40mg  every other day 9.  CKD stage III.  Follow-up chemistries  -5/23 BUN and creatinine stable at 2.2/23-- stable 02/12/24 10.  Remote tobacco use.  Provide counseling 11.  Hyperlipidemia.  Crestor  10mg  nightly 12. Severe constipation- continue senokot 2 tabs BID, miralax  daily; will need to order Sorbitol  tomorrow after therapy- since has been at least 8 days since LBM.   - 5/23 LBM today-large recorded 13. Patient wants a haircut- usually wears hair "bald"- and shaves at home and on coumadin - please evaluate if possible.  14.  Insomnia.  Wife declines any medication changes tonight.  Would consider tomorrow 15.  Sundowning/delirium.  Avoid medications that worsen cognition.  Consider tramadol  HS to help regulate sleep-wake cycle.  His wife asked to hold off on this tonight 16. GI ppx: continue protonix  40mg  daily  17. Hypotension:  -02/12/24 BPs soft but stable, pt doing well overall, monitor closely  Vitals:   02/10/24 1901 02/11/24 0500 02/11/24 0814 02/11/24 1354  BP: (!) 89/69 100/60 103/72 91/79   02/11/24 2008 02/12/24 0348 02/12/24 0756  BP: (!) 92/38 104/69 (!) 84/72       LOS: 2 days A FACE TO FACE EVALUATION WAS PERFORMED  [address] 02/12/2024, 11:33 AM

## 2024-02-12 NOTE — Progress Notes (Signed)
 Speech Language Pathology Daily Session Note  Patient Details  Name: Isaac [name] MRN: [id number] Date of Birth: 12/22/1950  Today's Date: 02/12/2024 SLP Individual Time: 1100-1156 SLP Individual Time Calculation (min): 56 min  Short Term Goals: Week 1: SLP Short Term Goal 1 (Week 1): Patient will orient to date and situation in 4/5 opportunities using external aids as needed when provided with mod assist. SLP Short Term Goal 2 (Week 1): Patient will attend to right side of functional tasks with visual components in 80% of trials given mod assist. SLP Short Term Goal 3 (Week 1): Patient will attend to functional therapy tasks for 10 minutes given overall mod assist throughout. SLP Short Term Goal 4 (Week 1): Patient will utilize basic word finding strategies during generative and responsive naming tasks to name items with 80% accuracy given mod assist. SLP Short Term Goal 5 (Week 1): Patient will solve basic functional problems with 80% accuracy given mod assist.  Skilled Therapeutic Interventions: SLP conducted skilled therapy session targeting cognitive and communication goals. Patient pleasant throughout all tasks and agreeable to work on cognitive-communication deficits. SLP began with naming tasks; patient named opposites when given a word with supervision-min assist, synonyms with mod to max assist, responsive naming with min assist, and generative naming with mod to max assist. Throughout naming tasks, patient benefited from min to mod cues for working memory and supervision to min assist for sustained attention for 30 minutes. SLP then targeted right inattention via scanning task. Patient completed 3 different scanning tasks with mod to max assist for thoroughness and access to 100% of visually presented materials. Patient was left in room with call bell in reach and alarm set. SLP will continue to target goals per plan of care.        Pain Pain Assessment Pain Scale: 0-10 Pain  Score: 0-No pain  Therapy/Group: Individual Therapy  Silvestre Mines, M.A., CCC-SLP  Anastasija Anfinson A Anubis Fundora 02/12/2024, 11:58 AM

## 2024-02-12 NOTE — Progress Notes (Signed)
 Occupational Therapy Session Note  Patient Details  Name: Isaac [name] MRN: [id number] Date of Birth: Oct 07, 1950  Today's Date: 02/12/2024 OT Individual Time: 1914-7829 OT Individual Time Calculation (min): 77 min    Short Term Goals: Week 1:  OT Short Term Goal 1 (Week 1): Pt will complete toilet transfer with CGA using LRAD OT Short Term Goal 2 (Week 1): Pt will visually scan to R side during ADL task with min cues OT Short Term Goal 3 (Week 1): Pt will transfer LVAD from main power source to battery packs with min A OT Short Term Goal 4 (Week 1): Pt will keep LVAD battery/drive line in safe place with min verbal cues about 75% of the time  Skilled Therapeutic Interventions/Progress Updates:  Pt received sitting in [facility] with son present at side, no reports of pain/discomfort this session. Time dedicated to transferring patient's LVAD from wall to battery power, emphasis placed on step-by-step verbal instruction/review with patient who presents with little to no awareness of process. Pt with decreased memory as to how long he has had device implanted. Pt dependently transported from [employer] for energy conservation. In ortho gym, pt participated in x7 mins of Sci[employer] arm cycle for BUE strength/endurance required for functional transfers and ADL participation. Pt tolerates level 2 well with x1 rest break. Session then transitioned to standing balance/tolerance, [facility] activity incorporated for RUE NMR. Pt requires Max multimodal cuing for attention to R-hand, reverting to using L-hand despite being R-hand dominant. Pt's success with [facility] pegboard activity greatly impacted by visual deficits as patient only able to replicate L-side of image, requiring verbal cuing for awareness of remainder of picture. Pt then participates in series of visual scanning activities using BITs modality, demo'ing the same deficits but benefiting from visual anchor (following his/therapist's hand alongside screen) to  visually attend to R-side of screen. Extensive education provided to son and spouse on visual deficits as well as compensatory techniques such as having patient physically turn his head to visually access R-side of environment. Spouse requesting activities to engage in on weekend day off from therapy, tan theraputty provided (alongside simple [facility] exercises) and request for therapeutic pass requested for continued visual scanning practice. Pt remained sitting in WC, connected back to wall power, family present.   Therapy Documentation Precautions:  Precautions Precautions: Fall Precaution/Restrictions Comments: LVAD, R neglect, dressing over defibrillator Restrictions Weight Bearing Restrictions Per Provider Order: No   Therapy/Group: Individual Therapy  Artemus Biles, OTR/L, MSOT  02/12/2024, 6:36 AM

## 2024-02-12 NOTE — Progress Notes (Signed)
 PHARMACY - ANTICOAGULATION CONSULT NOTE  Pharmacy Consult for warfarin  Indication: LVAD, new acute ischemic infarct   Allergies  Allergen Reactions   Mushroom Extract Complex (Obsolete) Nausea And Vomiting   Doxycycline  Nausea And Vomiting   Neosporin [Neomycin-Bacitracin Zn-Polymyx] Hives   Tape Other (See Comments)    Some tapes/dressings can irritate the skin    Patient Measurements: Height: 5\' 9"  (175.3 cm) Weight: 81.1 kg (178 lb 12.7 oz) IBW/kg (Calculated) : 70.7 HEPARIN  DW (KG): 81  Vital Signs: Temp: 98.5 F (36.9 C) (05/24 0756) Temp Source: Oral (05/24 0756) BP: 95/69 (05/24 1248) Pulse Rate: 50 (05/24 1248)  Labs: Recent Labs    02/10/24 0229 02/11/24 0652 02/12/24 0730  HGB 12.3* 13.4 13.1  HCT 37.8* 40.8 39.8  PLT 203 197 226  LABPROT 26.3* 23.7* 23.2*  INR 2.4* 2.1* 2.0*  CREATININE 2.26* 2.20* 2.22*    Estimated Creatinine Clearance: 30.5 mL/min (A) (by C-G formula based on SCr of 2.22 mg/dL (H)).   Medical History: Past Medical History:  Diagnosis Date   Arrhythmia    atrial fibrillation   CHF (congestive heart failure) (HCC)    Chronic kidney disease    Coronary artery disease    Hyperlipidemia    Hypertension    Myocardial infarct [facility])    Assessment: 69 yoM with HM LVAD on warfarin PTA admitted with acute CVA. Home warfarin regimen is 4mg  Mon/2mg  AODs with INR goal 2-2.5. Per HF and neurology, increase INR goal to 2.5-3 for now. Pt previously on heparin  briefly this admit.  Pt s/p gen change, needed INR on lower end of range per EP request for the procedure. INR today is 2.0, likely due to lower 1mg  doses given ~02/09/24.  New INR goal is 2.5-3.0 dueLVAD, new acute ischemic infarct.  CBC wnl/ stable.   Goal of Therapy:  INR 2.5-3 Monitor platelets by anticoagulation protocol: Yes   Plan:  Warfarin 5mg  x1 tonight Daily INR   Alisa Irish, RPh Clinical Pharmacist Please check AMION for all [facility] numbers 02/12/2024

## 2024-02-12 NOTE — Progress Notes (Signed)
 Physical Therapy Session Note  Patient Details  Name: Isaac [name] MRN: [id number] Date of Birth: [birth year]  Today's Date: 02/12/2024 PT Individual Time: 1305-1340 PT Individual Time Calculation (min): 35 min   Short Term Goals: Week 1:  PT Short Term Goal 1 (Week 1): pt will navigate stairs with CGA PT Short Term Goal 2 (Week 1): Pt will require min a for LVAD batteries PT Short Term Goal 3 (Week 1): Pt will ambulate >150 ft with RW  Skilled Therapeutic Interventions/Progress Updates: Patient sitting in [facility] with some present on entrance to room. Patient alert and agreeable to PT session.   Pt with no complaints of pain. Pt participated in switching LVAD unit from home power unit to batteries with max cuing of sequence and set up and increased time required with son present (education provided). Pt performed stand pivot transfers without AD and CGA and VC to increase R LE retro-step to feel sitting surface on back of knees (attempted to sit on hi/low mat with nothing behind pt, only L UE on mat, due to poor R sided awareness). Pt ambulated from room<>ortho room gym without AD and CGA/minA to safely navigate hallway. Pt favors L side due to R sided inattention. Pt provided with question cue of how to navigate around obstacle in hallway as pt had to walk around R side (increase R sided tracking of environment). Pt participated in bell cancellation on BITS system with parameters set to "Full Brain." Pt required maxA to locate bells, and strictly stayed on L side of screen and performed in standing with CGA (pt also cued at beginning to find the TV with pt avoiding R side of room and required hinted cues to increase visual tracking of room, especially on R side). Pt son educated to stay on R side of pt to increase pt's R sided awareness.   Patient sitting in WC at end of session with brakes locked, son and nsg present, and all needs within reach.      Therapy Documentation Precautions:   Precautions Precautions: Fall Precaution/Restrictions Comments: LVAD, R neglect, dressing over defibrillator Restrictions Weight Bearing Restrictions Per Provider Order: No   Therapy/Group: Individual Therapy  Toua Stites PTA 02/12/2024, 4:03 PM

## 2024-02-13 DIAGNOSIS — I63532 Cerebral infarction due to unspecified occlusion or stenosis of left posterior cerebral artery: Secondary | ICD-10-CM | POA: Diagnosis not present

## 2024-02-13 DIAGNOSIS — I959 Hypotension, unspecified: Secondary | ICD-10-CM | POA: Diagnosis not present

## 2024-02-13 DIAGNOSIS — K5901 Slow transit constipation: Secondary | ICD-10-CM | POA: Diagnosis not present

## 2024-02-13 DIAGNOSIS — Z95811 Presence of heart assist device: Secondary | ICD-10-CM | POA: Diagnosis not present

## 2024-02-13 DIAGNOSIS — N183 Chronic kidney disease, stage 3 unspecified: Secondary | ICD-10-CM | POA: Diagnosis not present

## 2024-02-13 LAB — CBC
HCT: 36.2 % — ABNORMAL LOW (ref 39.0–52.0)
Hemoglobin: 11.6 g/dL — ABNORMAL LOW (ref 13.0–17.0)
MCH: 29.1 pg (ref 26.0–34.0)
MCHC: 32 g/dL (ref 30.0–36.0)
MCV: 91 fL (ref 80.0–100.0)
Platelets: 224 10*3/uL (ref 150–400)
RBC: 3.98 MIL/uL — ABNORMAL LOW (ref 4.22–5.81)
RDW: 14.6 % (ref 11.5–15.5)
WBC: 4.3 10*3/uL (ref 4.0–10.5)
nRBC: 0 % (ref 0.0–0.2)

## 2024-02-13 LAB — LACTATE DEHYDROGENASE: LDH: 154 U/L (ref 98–192)

## 2024-02-13 LAB — PROTIME-INR
INR: 2.4 — ABNORMAL HIGH (ref 0.8–1.2)
Prothrombin Time: 26.2 s — ABNORMAL HIGH (ref 11.4–15.2)

## 2024-02-13 MED ORDER — WARFARIN SODIUM 4 MG PO TABS
4.0000 mg | ORAL_TABLET | Freq: Once | ORAL | Status: AC
  Administered 2024-02-13: 4 mg via ORAL
  Filled 2024-02-13: qty 1

## 2024-02-13 MED ORDER — ONDANSETRON HCL 4 MG/2ML IJ SOLN: 4.0000 mg | Freq: Four times a day (QID) | INTRAMUSCULAR | Status: DC | PRN

## 2024-02-13 NOTE — Progress Notes (Addendum)
 Patient ID: Isaac [name], male   DOB: 31-Jan-1951, 73 y.o.   MRN: [id number]     Advanced Heart Failure Rounding Note  Cardiologist: Peder Bourdon, MD  Chief Complaint: Acute CVA Subjective:    Now admitted to CIR. Cognition gradually improving per family, still with confusion at night.  MAP 70s-80s. INR 2.4  Has been up working with PT, getting stronger.    LVAD Interrogation HM III: Speed: 5400 Flow: 4.6 PI: 3.1 Power: 3.9. No PI events  Objective:    Weight Range: 80.1 kg Body mass index is 26.08 kg/m.   Vital Signs:   Temp:  [97.7 F (36.5 C)-98.3 F (36.8 C)] 98.3 F (36.8 C) (05/25 0356) Pulse Rate:  [55-59] 57 (05/25 0356) Resp:  [16-18] 18 (05/25 0356) BP: (84-95)/(63-74) 88/74 (05/25 0356) SpO2:  [99 %-100 %] 99 % (05/25 0356) Weight:  [80.1 kg] 80.1 kg (05/25 0416) Last BM Date : 02/11/24  Weight change: Filed Weights   02/12/24 0415 02/12/24 0724 02/13/24 0416  Weight: 79.8 kg 81.1 kg 80.1 kg   Intake/Output:  Intake/Output Summary (Last 24 hours) at 02/13/2024 1113 Last data filed at 02/13/2024 0900 Gross per 24 hour  Intake 834 ml  Output 850 ml  Net -16 ml    Physical Exam   General: Well appearing this am. NAD.  HEENT: Normal. Neck: Supple, JVP 8-9 cm with prominent CV waves. Carotids OK.  Cardiac:  Mechanical heart sounds with LVAD hum present.  Lungs:  CTAB, normal effort.  Abdomen:  NT, ND, no HSM. No bruits or masses. +BS  LVAD exit site: Well-healed and incorporated. Dressing dry and intact. No erythema or drainage. Stabilization device present and accurately applied. Driveline dressing changed daily per sterile technique. Extremities:  Warm and dry. No cyanosis, clubbing, rash, or edema.  Neuro:  Alert & oriented x 3. Cranial nerves grossly intact. Moves all 4 extremities w/o difficulty. Affect pleasant     Labs    CBC Recent Labs    02/11/24 0652 02/12/24 0730 02/13/24 0630  WBC 3.7* 3.8* 4.3  NEUTROABS 1.5*  --   --   HGB  13.4 13.1 11.6*  HCT 40.8 39.8 36.2*  MCV 90.7 90.9 91.0  PLT 197 226 224   Basic Metabolic Panel Recent Labs    78/29/56 0652 02/12/24 0730  NA 135 137  K 4.1 4.1  CL 105 105  CO2 25 23  GLUCOSE 86 100*  BUN 23 26*  CREATININE 2.20* 2.22*  CALCIUM  8.6* 8.9   Liver Function Tests Recent Labs    02/11/24 0652  AST 20  ALT 13  ALKPHOS 91  BILITOT 1.1  PROT 6.1*  ALBUMIN  2.9*   BNP (last 3 results) Recent Labs    02/05/24 1106  BNP 343.8*   Imaging   No results found.  Medications:    Scheduled Medications:  feeding supplement  237 mL Oral BID BM   furosemide   40 mg Oral QODAY   magnesium  oxide  400 mg Oral Daily   pantoprazole   40 mg Oral Daily   polyethylene glycol  17 g Oral Daily   rosuvastatin   10 mg Oral QHS   senna-docusate  2 tablet Oral BID   sildenafil   20 mg Oral TID   Warfarin - Pharmacist Dosing Inpatient   Does not apply q1600   Infusions:   PRN Medications: acetaminophen , ondansetron , mouth rinse, mouth rinse  Patient Profile   Isaac [name] is a 73 y.o. male with a  history of VT, NICM, s/p ICD, LVAD, afib, CKD IV, previously requiring HD, and CAD s/p PCI.   Assessment/Plan   1. Acute CVA: ?embolic - CT brain 5/17: + for acute to subacute L PCA and Left cerebellar infarcts. L PCA occlusion. No hemorrhagic transformation or mass effect.  - CT brain 5/18: evolution of L PCA, L cerebellar and R MCA strokes. No hemorrhage - Echo w/o clear source of embolization - Goal INR increased 2.5-3.0. Warfarin dosing per PharmD. INR 2.7 today.  - now admitted to CIR; working with PT; he is getting stronger.  - Deficits appear to be improving (speech, right-sided weakness).    2. Chronic systolic CHF:   - Long-standing cardiomyopathy. [employer] ICD s/p gen change 5/21. S/p HM-3 VAD 09/17/21.   - NYHA I at baseline - VAD interrogated personally. Stable parameters. - MAP stable.  - Euvolemic. Continue PO Lasix  40 mg EOD - Continue  sildenafil  20 mg daily for RV dysfunction - Pressure dressing remains in place from generator change.  - EP checked ICD site today, looks stable with minimal swelling.    3. Tricuspid regurgitation:   - Tricuspid repair not done at time of VAD due to proximity of ICD wires and hypotension during surgery.  TR read as normal on echo but I reviewed and suspect that it remains severe.    4. Chronic Atrial fibrillation: - off amiodarone  with resolution of nausea. - ICD dependent   5. CKD stage IV:  - baseline Scr ~2.5.  - Scr stable at 2.22 - Follows with nephrology.    6. CAD: History of PCI to OM1 in 2007 and RCA in 2013.   - continue statin   7. GERD with coffee ground emesis - continue PPI  I reviewed the LVAD parameters from today, and compared the results to the patient's prior recorded data.  No programming changes were made.  The LVAD is functioning within specified parameters.  The patient performs LVAD self-test daily.  LVAD interrogation was negative for any significant power changes, alarms or PI events/speed drops.  LVAD equipment check completed and is in good working order.  Back-up equipment present.   LVAD education done on emergency procedures and precautions and reviewed exit site care.  Length of Stay: 3  Peder Bourdon, MD  02/13/2024, 11:13 AM  VAD Team Pager [phone] (7am - 7am)  Advanced Heart Failure Team Pager [phone] (M-F; 7a - 5p)  Please contact [facility] for night-coverage after hours (5p -7a ) and weekends on [email or web address]

## 2024-02-13 NOTE — Progress Notes (Addendum)
 Progress Note  Patient Name: Isaac [name] Date of Encounter: 02/13/2024  Primary Cardiologist: Peder Bourdon, MD   Subjective   No complaints.   Inpatient Medications    Scheduled Meds:  feeding supplement  237 mL Oral BID BM   furosemide   40 mg Oral QODAY   magnesium  oxide  400 mg Oral Daily   pantoprazole   40 mg Oral Daily   polyethylene glycol  17 g Oral Daily   rosuvastatin   10 mg Oral QHS   senna-docusate  2 tablet Oral BID   sildenafil   20 mg Oral TID   warfarin  4 mg Oral ONCE-1600   Warfarin - Pharmacist Dosing Inpatient   Does not apply q1600   Continuous Infusions:  PRN Meds: acetaminophen , ondansetron  (ZOFRAN ) IV, ondansetron , mouth rinse, mouth rinse   Vital Signs    Vitals:   02/12/24 1837 02/12/24 2116 02/13/24 0356 02/13/24 0416  BP: (!) 84/63 92/72 (!) 88/74   Pulse: (!) 55 (!) 59 (!) 57   Resp: 16 16 18    Temp: 97.7 F (36.5 C)  98.3 F (36.8 C)   TempSrc:   Oral   SpO2: 100% 100% 99%   Weight:    80.1 kg  Height:        Intake/Output Summary (Last 24 hours) at 02/13/2024 1252 Last data filed at 02/13/2024 0900 Gross per 24 hour  Intake 834 ml  Output 850 ml  Net -16 ml   Filed Weights   02/12/24 0415 02/12/24 0724 02/13/24 0416  Weight: 79.8 kg 81.1 kg 80.1 kg    Telemetry    none - Personally Reviewed  ECG    none - Personally Reviewed  Physical Exam   GEN: No acute distress.   Neck: No JVD Cardiac: RRR, no murmurs, rubs, or gallops.  Respiratory: Clear to auscultation bilaterally. GI: Soft, nontender, non-distended  MS: No edema; No deformity. Neuro:  Nonfocal  Psych: Normal affect  Incsion - with small lower lateral hematoma. Labs    Chemistry Recent Labs  Lab 02/10/24 0229 02/11/24 0652 02/12/24 0730  NA 138 135 137  K 4.2 4.1 4.1  CL 110 105 105  CO2 21* 25 23  GLUCOSE 105* 86 100*  BUN 27* 23 26*  CREATININE 2.26* 2.20* 2.22*  CALCIUM  8.6* 8.6* 8.9  PROT  --  6.1*  --   ALBUMIN   --  2.9*  --    AST  --  20  --   ALT  --  13  --   ALKPHOS  --  91  --   BILITOT  --  1.1  --   GFRNONAA 30* 31* 31*  ANIONGAP 7 5 9      Hematology Recent Labs  Lab 02/11/24 0652 02/12/24 0730 02/13/24 0630  WBC 3.7* 3.8* 4.3  RBC 4.50 4.38 3.98*  HGB 13.4 13.1 11.6*  HCT 40.8 39.8 36.2*  MCV 90.7 90.9 91.0  MCH 29.8 29.9 29.1  MCHC 32.8 32.9 32.0  RDW 15.0 14.7 14.6  PLT 197 226 224    Cardiac EnzymesNo results for input(s): "TROPONINI" in the last 168 hours. No results for input(s): "TROPIPOC" in the last 168 hours.   BNPNo results for input(s): "BNP", "PROBNP" in the last 168 hours.   DDimer No results for input(s): "DDIMER" in the last 168 hours.   Radiology    No results found.  Cardiac Studies   none  Patient Profile     73 y.o. male admitted to rehab  and found to have small hematoma after ICD gen change in the setting of uninterupted coumadin .  Assessment & Plan    ICD hematoma -this is mild. No change in management exect try to keep INR below 2.7. I removed his bandage in total and left steristrips in place.      For questions or updates, please contact [facility] Please consult [email or web address] for contact info under Cardiology/STEMI.      Signed, Manya Sells, MD  02/13/2024, 12:52 PM

## 2024-02-13 NOTE — Progress Notes (Signed)
 Patient wife staying at bedside. Wife request bed alarm be turned off while she is here.

## 2024-02-13 NOTE — Progress Notes (Signed)
 PHARMACY - ANTICOAGULATION CONSULT NOTE  Pharmacy Consult for warfarin  Indication: LVAD, new acute ischemic infarct   Allergies  Allergen Reactions   Mushroom Extract Complex (Obsolete) Nausea And Vomiting   Doxycycline  Nausea And Vomiting   Neosporin [Neomycin-Bacitracin Zn-Polymyx] Hives   Tape Other (See Comments)    Some tapes/dressings can irritate the skin    Patient Measurements: Height: 5\' 9"  (175.3 cm) Weight: 80.1 kg (176 lb 9.4 oz) IBW/kg (Calculated) : 70.7 HEPARIN  DW (KG): 81  Vital Signs: Temp: 98.3 F (36.8 C) (05/25 0356) Temp Source: Oral (05/25 0356) BP: 88/74 (05/25 0356) Pulse Rate: 57 (05/25 0356)  Labs: Recent Labs    02/11/24 0652 02/12/24 0730 02/13/24 0630  HGB 13.4 13.1 11.6*  HCT 40.8 39.8 36.2*  PLT 197 226 224  LABPROT 23.7* 23.2* 26.2*  INR 2.1* 2.0* 2.4*  CREATININE 2.20* 2.22*  --     Estimated Creatinine Clearance: 30.5 mL/min (A) (by C-G formula based on SCr of 2.22 mg/dL (H)).   Medical History: Past Medical History:  Diagnosis Date   Arrhythmia    atrial fibrillation   CHF (congestive heart failure) (HCC)    Chronic kidney disease    Coronary artery disease    Hyperlipidemia    Hypertension    Myocardial infarct [facility])    Assessment: 17 yoM with HM LVAD on warfarin PTA admitted with acute CVA. Home warfarin regimen is 4mg  Mon/2mg  AODs with INR goal 2-2.5. Per HF and neurology, increase INR goal to 2.5-3 for now. Pt previously on heparin  briefly this admit.  Pt s/p gen change, needed INR on lower end of range per EP request for the procedure. INR today is 2.4.  New INR goal is 2.5-3.0 dueLVAD, new acute ischemic infarct.  CBC wnl/ stable.   Goal of Therapy:  INR 2.5-3 Monitor platelets by anticoagulation protocol: Yes   Plan:  Warfarin 4 mg x1 tonight Daily INR   Joanell Mowers, Davey Erp, [facility] Clinical Pharmacist  02/13/2024 12:24 PM   [facility] pharmacy phone numbers are listed on [email or web address]

## 2024-02-13 NOTE — Progress Notes (Signed)
 PROGRESS NOTE   Subjective/Complaints:  Pt doing well again, states he slept well. Pain well managed. LBM 2 days ago. Urinating fine. No other complaints or concerns voiced.   ROS: as per HPI. Denies CP, SOB, abd pain, N/V/D/C, or any other complaints at this time.   + Leg swelling, constipation, weakness  Objective:   No results found. Recent Labs    02/12/24 0730 02/13/24 0630  WBC 3.8* 4.3  HGB 13.1 11.6*  HCT 39.8 36.2*  PLT 226 224   Recent Labs    02/11/24 0652 02/12/24 0730  NA 135 137  K 4.1 4.1  CL 105 105  CO2 25 23  GLUCOSE 86 100*  BUN 23 26*  CREATININE 2.20* 2.22*  CALCIUM  8.6* 8.9    Intake/Output Summary (Last 24 hours) at 02/13/2024 1031 Last data filed at 02/13/2024 0900 Gross per 24 hour  Intake 834 ml  Output 850 ml  Net -16 ml        Physical Exam: Vital Signs Blood pressure (!) 88/74, pulse (!) 57, temperature 98.3 F (36.8 C), temperature source Oral, resp. rate 18, height 5\' 9"  (1.753 m), weight 80.1 kg, SpO2 99%.  General: No apparent distress, resting in bed.  HEENT: Head is normocephalic, atraumatic, mild right facial weakness, mucous membranes moist, wearing glasses Heart: LVAD hum present Chest: CTA bilaterally, nonlabored, L upper chest dressing CDI but not removed Abdomen: Soft, non-tender, nondistended, normoactive bowel sounds. Extremities: No clubbing, cyanosis, or edema.  Psych: Pt's affect is flat but pleasant Skin: L upper chest bandage as above. L abdomen LVAD drive site c/d/I. Clean and intact without signs of breakdown  PRIOR EXAMS: Neuro: Patient is alert.  Mild left gaze preference.  Follows simple commands Pt has delayed responses and speech Expressive aphasia noted  RUE- 4/5 throughout however has R sided neglect- hard to access LUE- 5/5 except FA 4+/5 as well as grip 5-/5 RLE- HF 2/5; KE/KF 4-/5; DF 4-/5 and PF 4/5 LLE- 5/5 throughout  Exam appears  consistent from prior 5/23  MSK: no joint tenderness noted    Assessment/Plan: 1. Functional deficits which require 3+ hours per day of interdisciplinary therapy in a comprehensive inpatient rehab setting. Physiatrist is providing close team supervision and 24 hour management of active medical problems listed below. Physiatrist and rehab team continue to assess barriers to discharge/monitor patient progress toward functional and medical goals  Care Tool:  Bathing    Body parts bathed by patient: Right arm, Left arm, Chest, Abdomen, Front perineal area, Right upper leg, Left upper leg, Face   Body parts bathed by helper: Buttocks, Right lower leg, Left lower leg     Bathing assist Assist Level: Moderate Assistance - Patient 50 - 74%     Upper Body Dressing/Undressing Upper body dressing   What is the patient wearing?: Pull over shirt, Orthosis (LVAD vest) Orthosis activity level: Performed by helper  Upper body assist Assist Level: Maximal Assistance - Patient 25 - 49%    Lower Body Dressing/Undressing Lower body dressing      What is the patient wearing?: Incontinence brief, Pants     Lower body assist Assist for lower body dressing:  Moderate Assistance - Patient 50 - 74%     Toileting Toileting    Toileting assist Assist for toileting: Maximal Assistance - Patient 25 - 49%     Transfers Chair/bed transfer  Transfers assist     Chair/bed transfer assist level: Contact Guard/Touching assist     Locomotion Ambulation   Ambulation assist      Assist level: Minimal Assistance - Patient > 75% Assistive device: No Device Max distance: 32ft   Walk 10 feet activity   Assist     Assist level: Minimal Assistance - Patient > 75% Assistive device: No Device   Walk 50 feet activity   Assist    Assist level: Minimal Assistance - Patient > 75% Assistive device: No Device    Walk 150 feet activity   Assist Walk 150 feet activity did not occur:  Safety/medical concerns  Assist level: Minimal Assistance - Patient > 75% Assistive device: No Device    Walk 10 feet on uneven surface  activity   Assist Walk 10 feet on uneven surfaces activity did not occur: Safety/medical concerns         Wheelchair     Assist Is the patient using a wheelchair?: Yes Type of Wheelchair: Manual Wheelchair activity did not occur: Safety/medical concerns  Wheelchair assist level: Dependent - Patient 0% Max wheelchair distance: 150    Wheelchair 50 feet with 2 turns activity    Assist        Assist Level: Dependent - Patient 0%   Wheelchair 150 feet activity     Assist      Assist Level: Dependent - Patient 0%   Blood pressure (!) 88/74, pulse (!) 57, temperature 98.3 F (36.8 C), temperature source Oral, resp. rate 18, height 5\' 9"  (1.753 m), weight 80.1 kg, SpO2 99%.  Medical Problem List and Plan: 1. 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  Antithrombotics: -DVT/anticoagulation:  Pharmaceutical: Coumadin  per pharmacy.  Goal INR 2.5-3.0 due to recent CVA             -antiplatelet therapy: N/A 3. Pain Management: tylenol  PRN 4. Mood/Behavior/Sleep: Provide emotional support             -antipsychotic agents: N/A 5. Neuropsych/cognition: This patient is NOT capable of making decisions on his own behalf. 6. Skin/Wound Care: Routine skin checks and LVAD team following for line drive, etc -2/95/62 nursing stating pt concerned with L chest wall dressing, but pt states he's not concerned-- LVAD team to address this bandage, it looks fine to me-- no mention of concern in  notes 7. Fluids/Electrolytes/Nutrition: Routine in and outs with follow-up chemistries, continue vitamins/supplements.  8.  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  Continue Sildenafil  20mg  TID.  Followed by heart failure team- appreciate assistance.  Status post generator change 02/09/2024- team wants pressure dressing "for as long as possible"- due to coumadin   -Continue Lasix  40mg  every other day -02/13/24 noticed that LDH hadn't been done since  5/22, added those on daily 9.  CKD stage III.  Follow-up chemistries  -5/23 BUN and creatinine stable at 2.2/23-- stable 02/12/24 10.  Remote tobacco use.  Provide counseling 11.  Hyperlipidemia.  Crestor  10mg  nightly 12. Severe constipation- continue senokot 2 tabs BID, miralax  daily; will need to order Sorbitol  tomorrow after therapy- since has been at least 8 days since LBM.   - 5/23 LBM today-large recorded  -02/13/24 no BM in 2 days but last one was large; monitor for now 13. Patient wants a haircut- usually wears hair "bald"- and shaves at home and on coumadin - please evaluate if possible.  14.  Insomnia.  Wife declines any medication changes tonight.  Would consider tomorrow 15.  Sundowning/delirium.  Avoid medications that worsen cognition.  Consider tramadol  HS to help regulate sleep-wake cycle.  His wife asked to hold off on this tonight 16. GI ppx: continue protonix  40mg  daily  17. Hypotension: -5/24-25/25 BPs soft but stable, pt doing well overall, monitor closely-- messaged Dr. Mitzie Anda about his pressures today 5/25 and he is fine with them.  Vitals:   02/10/24 1901 02/11/24 0500 02/11/24 0814 02/11/24 1354  BP: (!) 89/69 100/60 103/72 91/79   02/11/24 2008 02/12/24 0348 02/12/24 0756 02/12/24 1248  BP: (!) 92/38 104/69 (!) 84/72 95/69   02/12/24 1633 02/12/24 1837 02/12/24 2116 02/13/24 0356  BP: (!) 85/63 (!) 84/63 92/72 (!) 88/74    18. Anemia: -02/13/24 Hgb down to 11.6  from 12-13 range the last week-- but was 11.6 on 5/18 also; would monitor very closely, if continued drop, then would occult test stool. CBC ordered daily as of now so will be checked tomorrow  LOS: 3 days A FACE TO FACE EVALUATION WAS PERFORMED  [address] 02/13/2024, 10:31 AM

## 2024-02-13 NOTE — Progress Notes (Signed)
 This RN paged VAD coordinator to put nursing orders in for VAD.  This RN also verified documenting WDL VAD machine readings and doppler BP x1 per shift versus Q4hr; VAD coordinator Tawny Fate confirmed once per shift for above documentation unless outside of normal limits.

## 2024-02-14 DIAGNOSIS — Z95811 Presence of heart assist device: Secondary | ICD-10-CM | POA: Diagnosis not present

## 2024-02-14 DIAGNOSIS — G4700 Insomnia, unspecified: Secondary | ICD-10-CM | POA: Diagnosis not present

## 2024-02-14 DIAGNOSIS — K5900 Constipation, unspecified: Secondary | ICD-10-CM | POA: Diagnosis not present

## 2024-02-14 DIAGNOSIS — I63532 Cerebral infarction due to unspecified occlusion or stenosis of left posterior cerebral artery: Secondary | ICD-10-CM | POA: Diagnosis not present

## 2024-02-14 LAB — CBC
HCT: 35.9 % — ABNORMAL LOW (ref 39.0–52.0)
Hemoglobin: 11.7 g/dL — ABNORMAL LOW (ref 13.0–17.0)
MCH: 29.5 pg (ref 26.0–34.0)
MCHC: 32.6 g/dL (ref 30.0–36.0)
MCV: 90.4 fL (ref 80.0–100.0)
Platelets: 238 10*3/uL (ref 150–400)
RBC: 3.97 MIL/uL — ABNORMAL LOW (ref 4.22–5.81)
RDW: 14.6 % (ref 11.5–15.5)
WBC: 3.9 10*3/uL — ABNORMAL LOW (ref 4.0–10.5)
nRBC: 0 % (ref 0.0–0.2)

## 2024-02-14 LAB — BASIC METABOLIC PANEL WITH GFR
Anion gap: 5 (ref 5–15)
BUN: 31 mg/dL — ABNORMAL HIGH (ref 8–23)
CO2: 22 mmol/L (ref 22–32)
Calcium: 8.6 mg/dL — ABNORMAL LOW (ref 8.9–10.3)
Chloride: 108 mmol/L (ref 98–111)
Creatinine, Ser: 2.18 mg/dL — ABNORMAL HIGH (ref 0.61–1.24)
GFR, Estimated: 32 mL/min — ABNORMAL LOW (ref >60)
Glucose, Bld: 95 mg/dL (ref 70–99)
Potassium: 4.3 mmol/L (ref 3.5–5.1)
Sodium: 135 mmol/L (ref 135–145)

## 2024-02-14 LAB — PROTIME-INR
INR: 2.7 — ABNORMAL HIGH (ref 0.8–1.2)
Prothrombin Time: 28.8 s — ABNORMAL HIGH (ref 11.4–15.2)

## 2024-02-14 LAB — LACTATE DEHYDROGENASE: LDH: 148 U/L (ref 98–192)

## 2024-02-14 MED ORDER — SORBITOL 70 % SOLN: 30.0000 mL | Freq: Every day | Status: DC | PRN

## 2024-02-14 MED ORDER — WARFARIN SODIUM 3 MG PO TABS
3.0000 mg | ORAL_TABLET | Freq: Once | ORAL | Status: AC
  Administered 2024-02-14: 3 mg via ORAL
  Filled 2024-02-14: qty 1

## 2024-02-14 NOTE — Progress Notes (Addendum)
 PROGRESS NOTE   Subjective/Complaints:  No new complaints or concerns. Pt and wife report he is sleeping better at night.  His wife feels cognition is improving.   LBM wife reports 5/24  ROS: as per HPI. Denies CP, SOB, abd pain, N/V/D/C, or any other complaints at this time.   + Leg swelling, weakness + insomnia- improved  Objective:   No results found. Recent Labs    02/13/24 0630 02/14/24 0554  WBC 4.3 3.9*  HGB 11.6* 11.7*  HCT 36.2* 35.9*  PLT 224 238   Recent Labs    02/12/24 0730 02/14/24 0554  NA 137 135  K 4.1 4.3  CL 105 108  CO2 23 22  GLUCOSE 100* 95  BUN 26* 31*  CREATININE 2.22* 2.18*  CALCIUM  8.9 8.6*    Intake/Output Summary (Last 24 hours) at 02/14/2024 1231 Last data filed at 02/14/2024 0817 Gross per 24 hour  Intake 598 ml  Output 375 ml  Net 223 ml        Physical Exam: Vital Signs Blood pressure 96/77, pulse 61, temperature (!) 97.5 F (36.4 C), temperature source Oral, resp. rate 18, height 5\' 9"  (1.753 m), weight 80.5 kg, SpO2 98%.  General: No apparent distress, resting in bed.  HEENT: Head is normocephalic, atraumatic, mild right facial weakness, mucous membranes moist, wearing glasses Heart: LVAD hum present Chest: CTA bilaterally, nonlabored, L upper chest steristrips- looks ok  Abdomen: Soft, non-tender, nondistended, + bowel sounds a little hypoactive. Extremities: No clubbing, cyanosis, or edema.  Psych: Pt's affect is flat but pleasant Skin: L upper chest bandage as above. L abdomen LVAD drive site c/d/I. Clean and intact without signs of breakdown  Neuro: Patient is alert.  Mild left gaze preference.  Follows simple commands Pt has delayed responses and speech Expressive aphasia a little improve  RUE- 4/5 throughout however has R sided neglect- hard to access LUE- 5/5 except FA 4+/5 as well as grip 5-/5 RLE- HF 2/5; KE/KF 4-/5; DF 4-/5 and PF 4/5 LLE- 5/5  throughout  Exam appears consistent from prior 5/26  MSK: no joint tenderness noted    Assessment/Plan: 1. Functional deficits which require 3+ hours per day of interdisciplinary therapy in a comprehensive inpatient rehab setting. Physiatrist is providing close team supervision and 24 hour management of active medical problems listed below. Physiatrist and rehab team continue to assess barriers to discharge/monitor patient progress toward functional and medical goals  Care Tool:  Bathing    Body parts bathed by patient: Right arm, Left arm, Chest, Abdomen, Front perineal area, Right upper leg, Left upper leg, Face   Body parts bathed by helper: Buttocks, Right lower leg, Left lower leg     Bathing assist Assist Level: Moderate Assistance - Patient 50 - 74%     Upper Body Dressing/Undressing Upper body dressing   What is the patient wearing?: Pull over shirt, Orthosis (LVAD vest) Orthosis activity level: Performed by helper  Upper body assist Assist Level: Maximal Assistance - Patient 25 - 49%    Lower Body Dressing/Undressing Lower body dressing      What is the patient wearing?: Incontinence brief, Pants     Lower  body assist Assist for lower body dressing: Moderate Assistance - Patient 50 - 74%     Toileting Toileting    Toileting assist Assist for toileting: Maximal Assistance - Patient 25 - 49%     Transfers Chair/bed transfer  Transfers assist     Chair/bed transfer assist level: Contact Guard/Touching assist     Locomotion Ambulation   Ambulation assist      Assist level: Minimal Assistance - Patient > 75% Assistive device: No Device Max distance: 374ft   Walk 10 feet activity   Assist     Assist level: Minimal Assistance - Patient > 75% Assistive device: No Device   Walk 50 feet activity   Assist    Assist level: Minimal Assistance - Patient > 75% Assistive device: No Device    Walk 150 feet activity   Assist Walk 150  feet activity did not occur: Safety/medical concerns  Assist level: Minimal Assistance - Patient > 75% Assistive device: No Device    Walk 10 feet on uneven surface  activity   Assist Walk 10 feet on uneven surfaces activity did not occur: Safety/medical concerns         Wheelchair     Assist Is the patient using a wheelchair?: Yes Type of Wheelchair: Manual Wheelchair activity did not occur: Safety/medical concerns  Wheelchair assist level: Dependent - Patient 0% Max wheelchair distance: 150    Wheelchair 50 feet with 2 turns activity    Assist        Assist Level: Dependent - Patient 0%   Wheelchair 150 feet activity     Assist      Assist Level: Dependent - Patient 0%   Blood pressure 96/77, pulse 61, temperature (!) 97.5 F (36.4 C), temperature source Oral, resp. rate 18, height 5\' 9"  (1.753 m), weight 80.5 kg, SpO2 98%.  Medical Problem List and Plan: 1. 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  Antithrombotics: -DVT/anticoagulation:  Pharmaceutical: Coumadin  per pharmacy.  Goal INR 2.5-3.0 due to recent CVA             -antiplatelet therapy: N/A 3. Pain Management: tylenol  PRN 4. Mood/Behavior/Sleep: Provide emotional support             -antipsychotic agents: N/A 5. Neuropsych/cognition: This patient is NOT capable of making decisions on his own behalf. 6. Skin/Wound Care: Routine skin checks and LVAD team following for line drive, etc -12/29/79 nursing stating pt concerned with L chest wall dressing, but pt states he's not concerned-- LVAD team to address this bandage, it looks fine to me-- no  mention of concern in notes -5/26 L chest wall dressing appears dry/intact 7. Fluids/Electrolytes/Nutrition: Routine in and outs with follow-up chemistries, continue vitamins/supplements.  8.  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  Continue Sildenafil  20mg  TID.  Followed by heart failure team- appreciate assistance.  Status post generator change 02/09/2024- team wants pressure dressing "for as long as possible"- due to coumadin   -Continue  Lasix  40mg  every other day -02/13/24 noticed that LDH hadn't been done since 5/22, added those on daily 9.  CKD stage III.  Follow-up chemistries  -5/23 BUN and creatinine stable at 2.2/23- - stable 02/14/24 although BUN a little higher at 31, continue to monitor 10.  Remote tobacco use.  Provide counseling 11.  Hyperlipidemia.  Crestor  10mg  nightly 12. Severe constipation- continue senokot 2 tabs BID, miralax  daily; will need to order Sorbitol  tomorrow after therapy- since has been at least 8 days since LBM.   - 5/23 LBM today-large recorded  -02/13/24 no BM in 2 days but last one was large; monitor for now  5/26 Last BM recorded was 5/23, 5/24 pt wife reports he had BM 5/24, he declined senokot but took miralax , will continue current - add sorbitol  prn  13. Patient wants a haircut- usually wears hair "bald"- and shaves at home and on coumadin - please evaluate if possible.  14.  Insomnia.  Wife declines any medication changes tonight.  Would consider tomorrow 15.  Sundowning/delirium.  Avoid medications that worsen cognition.  Consider tramadol  HS to help regulate sleep-wake cycle.  His wife asked to hold off on this tonight  -improving 16. GI ppx: continue protonix  40mg  daily  17. Hypotension: -5/26 BP soft but stable, HF team following  Vitals:   02/11/24 0814 02/11/24 1354 02/11/24 2008 02/12/24 0348  BP: 103/72 91/79 (!) 92/38 104/69   02/12/24 0756 02/12/24 1248 02/12/24 1633 02/12/24 1837   BP: (!) 84/72 95/69 (!) 85/63 (!) 84/63   02/12/24 2116 02/13/24 0356 02/13/24 1805 02/14/24 0414  BP: 92/72 (!) 88/74 91/73 96/77     18. Anemia: -02/13/24 Hgb down to 11.6 from 12-13 range the last week-- but was 11.6 on 5/18 also; would monitor very closely, if continued drop, then would occult test stool. CBC ordered daily as of now so will be checked tomorrow  LOS: 4 days A FACE TO FACE EVALUATION WAS PERFORMED  Lylia Sand 02/14/2024, 12:31 PM

## 2024-02-14 NOTE — Progress Notes (Signed)
 Speech Language Pathology Daily Session Note  Patient Details  Name: Ladarion Munyon MRN: [id number] Date of Birth: 1951/06/17  Today's Date: 02/14/2024 SLP Individual Time: 0930-1015 SLP Individual Time Calculation (min): 45 min  Short Term Goals: Week 1: SLP Short Term Goal 1 (Week 1): Patient will orient to date and situation in 4/5 opportunities using external aids as needed when provided with mod assist. SLP Short Term Goal 2 (Week 1): Patient will attend to right side of functional tasks with visual components in 80% of trials given mod assist. SLP Short Term Goal 3 (Week 1): Patient will attend to functional therapy tasks for 10 minutes given overall mod assist throughout. SLP Short Term Goal 4 (Week 1): Patient will utilize basic word finding strategies during generative and responsive naming tasks to name items with 80% accuracy given mod assist. SLP Short Term Goal 5 (Week 1): Patient will solve basic functional problems with 80% accuracy given mod assist.  Skilled Therapeutic Interventions: SLP conducted skilled therapy session targeting cognitive goals. Wife endorses improvements in awareness of visual cuts, endorsing statements from patient such as "how come I can only see the left side of that picture" during PT. SLP facilitated scanning, working memory, and sustained attention task. Patient benefited from max assist for working memory and scanning as well as emergent awareness. Patient was left in room with call bell in reach and alarm set. SLP will continue to target goals per plan of care.        Pain Pain Assessment Pain Scale: 0-10 Pain Score: 0-No pain  Therapy/Group: Individual Therapy  Noni Stonesifer, M.A., CCC-SLP  Reynald Woods A Glennis Borger 02/14/2024, 11:11 AM

## 2024-02-14 NOTE — Progress Notes (Signed)
 Occupational Therapy Session Note  Patient Details  Name: Isaac [name] MRN: [id number] Date of Birth: 1950-11-17  Today's Date: 02/14/2024 OT Individual Time: 1191-4782 OT Individual Time Calculation (min): 59 min    Short Term Goals: Week 1:  OT Short Term Goal 1 (Week 1): Pt will complete toilet transfer with CGA using LRAD OT Short Term Goal 2 (Week 1): Pt will visually scan to R side during ADL task with min cues OT Short Term Goal 3 (Week 1): Pt will transfer LVAD from main power source to battery packs with min A OT Short Term Goal 4 (Week 1): Pt will keep LVAD battery/drive line in safe place with min verbal cues about 75% of the time  Skilled Therapeutic Interventions/Progress Updates:  Skilled OT intervention completed with focus on ADL retraining. Pt received upright in bed with wife present, agreeable to session. No pain reported, though "itchiness" reported at defibrillator dressing site.  Pt's wife had questions regarding routine bathing on the unit, as well as what she was cleared to do for pt. OT advised the she could assist at bed level but all functional mobility needs to be completed with staff for safety. Plan to provide hands on education at later date. Wife reports pt has struggled going from independence > needing assist especially with ADLs and private areas being uncovered. OT suggested pt trial OT before PT to increase opportunity for bathing/dressing at start of day (pt's routine) and to allow one on one opportunity for pt to engage in self as independently and with as much modesty as possible vs with time constraint nurse who has multiple patients at one time. Wife and pt receptive and scheduling notified.  Transitioned > EOB with supervision, with improved awareness of keeping HM3 monitor close to him during mobility. Pt with delayed processing, R visual deficit and mild R inattention, requiring intermittent cues throughout. Overall able to doff shirt with  supervision, wash UB with cues for washing L underarm with RUE, donned shirt with supervision. Cues needed to [facility] monitor strap over R shoulder due to L shoulder dressing. Donned both shoes with time and supervision. CGA sit > stand and stand pivot without AD > w/c. Cues needed for backing up to w/c and feeling both knees touch w/c prior to sitting for safety. Brushed teeth with supervision.  Family present for visiting. Pt remained seated in w/c with belt alarm on/activated, and with all needs in reach at end of session.   Therapy Documentation Precautions:  Precautions Precautions: Fall Precaution/Restrictions Comments: LVAD, R neglect, dressing over defibrillator Restrictions Weight Bearing Restrictions Per Provider Order: No    Therapy/Group: Individual Therapy  Ruthanna Covert, MS, OTR/L  02/14/2024, 12:15 PM

## 2024-02-14 NOTE — Progress Notes (Signed)
 PHARMACY - ANTICOAGULATION CONSULT NOTE  Pharmacy Consult for warfarin  Indication: LVAD, new acute ischemic infarct   Allergies  Allergen Reactions   Mushroom Extract Complex (Obsolete) Nausea And Vomiting   Doxycycline  Nausea And Vomiting   Neosporin [Neomycin-Bacitracin Zn-Polymyx] Hives   Tape Other (See Comments)    Some tapes/dressings can irritate the skin    Patient Measurements: Height: 5\' 9"  (175.3 cm) Weight: 80.5 kg (177 lb 7.5 oz) IBW/kg (Calculated) : 70.7 HEPARIN  DW (KG): 81  Vital Signs: Temp: 97.5 F (36.4 C) (05/26 0414) Temp Source: Oral (05/26 0414) BP: 96/77 (05/26 0414) Pulse Rate: 61 (05/26 0414)  Labs: Recent Labs    02/12/24 0730 02/13/24 0630 02/14/24 0554  HGB 13.1 11.6* 11.7*  HCT 39.8 36.2* 35.9*  PLT 226 224 238  LABPROT 23.2* 26.2* 28.8*  INR 2.0* 2.4* 2.7*  CREATININE 2.22*  --   --     Estimated Creatinine Clearance: 30.5 mL/min (A) (by C-G formula based on SCr of 2.22 mg/dL (H)).   Medical History: Past Medical History:  Diagnosis Date   Arrhythmia    atrial fibrillation   CHF (congestive heart failure) (HCC)    Chronic kidney disease    Coronary artery disease    Hyperlipidemia    Hypertension    Myocardial infarct [facility])    Assessment: 69 yoM with HM LVAD on warfarin PTA admitted with acute CVA. Home warfarin regimen is 4mg  Mon/2mg  AODs with INR goal 2-2.5. Per HF and neurology, increase INR goal to 2.5-3 for now. Pt previously on heparin  briefly this admit.  Pt s/p gen change, needed INR on lower end of range per EP request for the procedure. INR today is 2.4.  New INR goal is 2.5-3.0 dueLVAD, new acute ischemic infarct.  CBC wnl/ stable. INR today 2.7. Note cardiology recommends keeping INR below 2.7 at this time.  Goal of Therapy:  INR 2.5-3 Monitor platelets by anticoagulation protocol: Yes   Plan:  Warfarin 3 mg x1 tonight Daily INR  Estela Held, PharmD PGY-2 Infectious Diseases Pharmacy Resident 02/14/2024  7:26 AM  [facility] pharmacy phone numbers are listed on [email or web address]

## 2024-02-14 NOTE — Progress Notes (Signed)
 Physical Therapy Session Note  Patient Details  Name: Isaac [name] MRN: [id number] Date of Birth: [birth year]  Today's Date: 02/14/2024 PT Individual Time: 1330-1445 PT Individual Time Calculation (min): 75 min   Short Term Goals: Week 1:  PT Short Term Goal 1 (Week 1): pt will navigate stairs with CGA PT Short Term Goal 2 (Week 1): Pt will require min a for LVAD batteries PT Short Term Goal 3 (Week 1): Pt will ambulate >150 ft with RW  Skilled Therapeutic Interventions/Progress Updates: Pt presents sitting in w/c and agreeable to therapy.  Pt transfers SPT w/c > bed w/ CGA to place LVAD vest on bed and batteries and monitors on bed to simulate how he manages at home.  Batteries retrieved for him.  Pt requires increased time and occasional cues for sequencing.  Pt Spouse also present for tips on how he managed at home.  Pt amb to small gym w/ CGA w/o AD before requesting need to use BR.  Pt amb to small BR and managed clothing w/ CGA for steadying.  Pt sup for pericare after continent of bowel and bladder in toilet.  Pt performed amb toe taps to cone obstacle course w/ alternating feet w/ cues for increased compensation to right side.  Pt performed standing BITS for Single Target, user paced ex using R hand only.  Pt first trial at average of 5.87 sec and decreased to 3.01 sec on 2nd trial w/ improved attention to R.  Pt initially scans L side but quickly recovers to scan right w/ practice.  Pt amb to main gym w/ cues for scanning right.  Pt returned to room and sat on bed for doffing of vest.  Pt removed leads to batteries more quickly than applying them and required 1 cue to check color of lead to match (white to white, black to black).  Pt returned to w/c w/ chair alarm on and all needs in reach, family member present.  Batteries returned to charging station.     Therapy Documentation Precautions:  Precautions Precautions: Fall Precaution/Restrictions Comments: LVAD, R neglect, dressing  over defibrillator Restrictions Weight Bearing Restrictions Per Provider Order: No General:   Vital Signs: Therapy Vitals Temp: (!) 97.3 F (36.3 C) Temp Source: Oral Pulse Rate: 61 Resp: 18 BP: 98/86 Patient Position (if appropriate): Sitting Oxygen Therapy SpO2: 100 % O2 Device: Room Air Pain:0/10      Therapy/Group: Individual Therapy  Josehua Hammar P Chimaobi Casebolt 02/14/2024, 4:02 PM

## 2024-02-14 NOTE — Progress Notes (Signed)
 Patient ID: Isaac [name], male   DOB: 24-Dec-1950, 73 y.o.   MRN: [id number]     Advanced Heart Failure Rounding Note  Cardiologist: Peder Bourdon, MD  Chief Complaint: Acute CVA Subjective:    Now admitted to CIR. Cognition gradually improving per family, speech much better. MAP 80s. INR 2.7  Has been up working with PT, getting stronger.    LVAD Interrogation HM III: Speed: 5300 Flow: 4.6 PI: 3.1 Power: 4. No PI events  Objective:    Weight Range: 80.5 kg Body mass index is 26.21 kg/m.   Vital Signs:   Temp:  [97.5 F (36.4 C)-97.9 F (36.6 C)] 97.5 F (36.4 C) (05/26 0414) Pulse Rate:  [59-61] 61 (05/26 0414) Resp:  [16-18] 18 (05/26 0414) BP: (91-96)/(73-77) 96/77 (05/26 0414) SpO2:  [98 %-100 %] 98 % (05/26 0414) Weight:  [80.5 kg] 80.5 kg (05/26 0415) Last BM Date : 02/11/24  Weight change: Filed Weights   02/12/24 0724 02/13/24 0416 02/14/24 0415  Weight: 81.1 kg 80.1 kg 80.5 kg   Intake/Output:  Intake/Output Summary (Last 24 hours) at 02/14/2024 0902 Last data filed at 02/14/2024 0817 Gross per 24 hour  Intake 598 ml  Output 375 ml  Net 223 ml    Physical Exam   General: Well appearing this am. NAD.  HEENT: Normal. Neck: Supple, JVP 8-9 cm with prominent CV waves. Carotids OK.  Cardiac:  Mechanical heart sounds with LVAD hum present.  Lungs:  CTAB, normal effort.  Abdomen:  NT, ND, no HSM. No bruits or masses. +BS  LVAD exit site: Well-healed and incorporated. Dressing dry and intact. No erythema or drainage. Stabilization device present and accurately applied. Driveline dressing changed daily per sterile technique. Extremities:  Warm and dry. No cyanosis, clubbing, rash, or edema.  Neuro:  Alert & oriented x 3. Speech seems normal.  Minimal right-sided weakness.    Labs    CBC Recent Labs    02/13/24 0630 02/14/24 0554  WBC 4.3 3.9*  HGB 11.6* 11.7*  HCT 36.2* 35.9*  MCV 91.0 90.4  PLT 224 238   Basic Metabolic Panel Recent Labs     02/12/24 0730 02/14/24 0554  NA 137 135  K 4.1 4.3  CL 105 108  CO2 23 22  GLUCOSE 100* 95  BUN 26* 31*  CREATININE 2.22* 2.18*  CALCIUM  8.9 8.6*   Liver Function Tests No results for input(s): "AST", "ALT", "ALKPHOS", "BILITOT", "PROT", "ALBUMIN " in the last 72 hours.  BNP (last 3 results) Recent Labs    02/05/24 1106  BNP 343.8*   Imaging   No results found.  Medications:    Scheduled Medications:  feeding supplement  237 mL Oral BID BM   furosemide   40 mg Oral QODAY   magnesium  oxide  400 mg Oral Daily   pantoprazole   40 mg Oral Daily   polyethylene glycol  17 g Oral Daily   rosuvastatin   10 mg Oral QHS   senna-docusate  2 tablet Oral BID   sildenafil   20 mg Oral TID   warfarin  3 mg Oral ONCE-1600   Warfarin - Pharmacist Dosing Inpatient   Does not apply q1600   Infusions:   PRN Medications: acetaminophen , ondansetron  (ZOFRAN ) IV, ondansetron , mouth rinse, mouth rinse  Patient Profile   Isaac [name] is a 73 y.o. male with a history of VT, NICM, s/p ICD, LVAD, afib, CKD IV, previously requiring HD, and CAD s/p PCI.   Assessment/Plan   1. Acute CVA: ?  embolic - CT brain 5/17: + for acute to subacute L PCA and Left cerebellar infarcts. L PCA occlusion. No hemorrhagic transformation or mass effect.  - CT brain 5/18: evolution of L PCA, L cerebellar and R MCA strokes. No hemorrhage - Echo w/o clear source of embolization - Goal INR increased 2.5-3.0. Warfarin dosing per PharmD. INR 2.7 today, per EP will try to keep INR no higher than 2.7.  - now admitted to CIR; working with PT; he is getting stronger.  - Deficits appear to be improving (speech, right-sided weakness).    2. Chronic systolic CHF:   - Long-standing cardiomyopathy. [employer] ICD s/p gen change 5/21. S/p HM-3 VAD 09/17/21.   - NYHA I at baseline - VAD interrogated personally. Stable parameters. - MAP stable.  - Euvolemic. Continue PO Lasix  40 mg EOD - Continue sildenafil   20 mg daily for RV dysfunction - Pressure dressing remains in place from generator change.  - EP checked ICD site today, looks stable with minimal swelling.    3. Tricuspid regurgitation:   - Tricuspid repair not done at time of VAD due to proximity of ICD wires and hypotension during surgery.  TR read as normal on echo but I reviewed and suspect that it remains severe.    4. Chronic Atrial fibrillation: - off amiodarone  with resolution of nausea. - ICD dependent   5. CKD stage IV:  - baseline Scr ~2.5.  - Scr stable at 2.18 - Follows with nephrology.    6. CAD: History of PCI to OM1 in 2007 and RCA in 2013.   - continue statin   7. GERD with coffee ground emesis - continue PPI  8. ICD site hematoma - Mild ICD site hematoma, evaluated yesterday by Dr. Carolynne Citron.  - INR goal has been 2.5-3, for now would try to keep INR no higher than 2.7.   I reviewed the LVAD parameters from today, and compared the results to the patient's prior recorded data.  No programming changes were made.  The LVAD is functioning within specified parameters.  The patient performs LVAD self-test daily.  LVAD interrogation was negative for any significant power changes, alarms or PI events/speed drops.  LVAD equipment check completed and is in good working order.  Back-up equipment present.   LVAD education done on emergency procedures and precautions and reviewed exit site care.  Length of Stay: 4  Peder Bourdon, MD  02/14/2024, 9:02 AM  VAD Team Pager [phone] (7am - 7am)  Advanced Heart Failure Team Pager [phone] (M-F; 7a - 5p)  Please contact [facility] for night-coverage after hours (5p -7a ) and weekends on [email or web address]

## 2024-02-15 DIAGNOSIS — I63532 Cerebral infarction due to unspecified occlusion or stenosis of left posterior cerebral artery: Secondary | ICD-10-CM | POA: Diagnosis not present

## 2024-02-15 DIAGNOSIS — D649 Anemia, unspecified: Secondary | ICD-10-CM

## 2024-02-15 DIAGNOSIS — Z95811 Presence of heart assist device: Secondary | ICD-10-CM | POA: Diagnosis not present

## 2024-02-15 DIAGNOSIS — I959 Hypotension, unspecified: Secondary | ICD-10-CM | POA: Diagnosis not present

## 2024-02-15 DIAGNOSIS — K5900 Constipation, unspecified: Secondary | ICD-10-CM | POA: Diagnosis not present

## 2024-02-15 LAB — CBC
HCT: 36.7 % — ABNORMAL LOW (ref 39.0–52.0)
Hemoglobin: 11.8 g/dL — ABNORMAL LOW (ref 13.0–17.0)
MCH: 29.2 pg (ref 26.0–34.0)
MCHC: 32.2 g/dL (ref 30.0–36.0)
MCV: 90.8 fL (ref 80.0–100.0)
Platelets: 224 10*3/uL (ref 150–400)
RBC: 4.04 MIL/uL — ABNORMAL LOW (ref 4.22–5.81)
RDW: 14.6 % (ref 11.5–15.5)
WBC: 3.8 10*3/uL — ABNORMAL LOW (ref 4.0–10.5)
nRBC: 0 % (ref 0.0–0.2)

## 2024-02-15 LAB — PROTIME-INR
INR: 2.8 — ABNORMAL HIGH (ref 0.8–1.2)
Prothrombin Time: 29.5 s — ABNORMAL HIGH (ref 11.4–15.2)

## 2024-02-15 LAB — LACTATE DEHYDROGENASE: LDH: 159 U/L (ref 98–192)

## 2024-02-15 MED ORDER — FUROSEMIDE 40 MG PO TABS
40.0000 mg | ORAL_TABLET | Freq: Every day | ORAL | Status: DC
  Administered 2024-02-15 – 2024-02-18 (×4): 40 mg via ORAL
  Filled 2024-02-15 (×4): qty 1

## 2024-02-15 MED ORDER — SENNOSIDES-DOCUSATE SODIUM 8.6-50 MG PO TABS: 2.0000 | ORAL_TABLET | Freq: Two times a day (BID) | ORAL | Status: DC | PRN

## 2024-02-15 MED ORDER — WARFARIN SODIUM 3 MG PO TABS
3.0000 mg | ORAL_TABLET | Freq: Every day | ORAL | Status: DC
  Administered 2024-02-15 – 2024-02-16 (×2): 3 mg via ORAL
  Filled 2024-02-15 (×2): qty 1

## 2024-02-15 NOTE — Progress Notes (Signed)
 PHARMACY - ANTICOAGULATION CONSULT NOTE  Pharmacy Consult for warfarin  Indication: LVAD, new acute ischemic infarct   Allergies  Allergen Reactions   Mushroom Extract Complex (Obsolete) Nausea And Vomiting   Doxycycline  Nausea And Vomiting   Neosporin [Neomycin-Bacitracin Zn-Polymyx] Hives   Tape Other (See Comments)    Some tapes/dressings can irritate the skin    Patient Measurements: Height: 5\' 9"  (175.3 cm) Weight: 81.9 kg (180 lb 8.9 oz) IBW/kg (Calculated) : 70.7 HEPARIN  DW (KG): 81  Vital Signs: Temp: 97.7 F (36.5 C) (05/27 1318) Temp Source: Oral (05/27 1318) BP: 95/69 (05/27 1318) Pulse Rate: 64 (05/27 1318)  Labs: Recent Labs    02/13/24 0630 02/14/24 0554 02/15/24 0455  HGB 11.6* 11.7* 11.8*  HCT 36.2* 35.9* 36.7*  PLT 224 238 224  LABPROT 26.2* 28.8* 29.5*  INR 2.4* 2.7* 2.8*  CREATININE  --  2.18*  --     Estimated Creatinine Clearance: 31.1 mL/min (A) (by C-G formula based on SCr of 2.18 mg/dL (H)).   Medical History: Past Medical History:  Diagnosis Date   Arrhythmia    atrial fibrillation   CHF (congestive heart failure) (HCC)    Chronic kidney disease    Coronary artery disease    Hyperlipidemia    Hypertension    Myocardial infarct [facility])    Assessment: 38 yoM with HM LVAD on warfarin PTA admitted with acute CVA. Home warfarin regimen PTA 4mg  Mon/2mg  AODs with INR goal 2-2.5. Per HF and neurology, increase INR goal to 2.5-3 for now. Pt previously on heparin  briefly this admit.  Pt s/p gen change, s/p new CVA ? Cardio-embolic  CBC wnl/ stable. INR today 2.8 Diet improving and gait improved -walking with PT  Goal of Therapy:  INR 2.5-3 Monitor platelets by anticoagulation protocol: Yes   Plan:  Warfarin 3 mg daily  Daily INR   Hortensia Ma Pharm.D. CPP, BCPS Clinical Pharmacist [phone] 02/15/2024 2:36 PM   [facility] pharmacy phone numbers are listed on [email or web address]

## 2024-02-15 NOTE — Progress Notes (Signed)
 Patient ID: Isaac [name], male   DOB: 08/21/1951, 73 y.o.   MRN: [id number]     Advanced Heart Failure Rounding Note  Cardiologist: Peder Bourdon, MD  Chief Complaint: Acute CVA Subjective:    Now admitted to CIR. Cognition and speech continue to improve.  MAP 80s. INR 2.8  Continues to work with PT. Seen while walking in the hall with PT.   LVAD Interrogation HM III: Speed: 5300 Flow: 4.6 PI: 3 Power: 4. Unable to fully interrogate device as he was ambulating.   Objective:    Weight Range: 81.9 kg Body mass index is 26.66 kg/m.   Vital Signs:   Temp:  [97.3 F (36.3 C)-98 F (36.7 C)] 98 F (36.7 C) (05/27 0411) Pulse Rate:  [58-61] 59 (05/27 0411) Resp:  [17-18] 18 (05/27 0411) BP: (90-100)/(62-86) 98/77 (05/27 0411) SpO2:  [99 %-100 %] 99 % (05/27 0411) Weight:  [81.9 kg] 81.9 kg (05/27 0418) Last BM Date : 02/11/24  Weight change: Filed Weights   02/13/24 0416 02/14/24 0415 02/15/24 0418  Weight: 80.1 kg 80.5 kg 81.9 kg   Intake/Output:  Intake/Output Summary (Last 24 hours) at 02/15/2024 0855 Last data filed at 02/15/2024 0424 Gross per 24 hour  Intake 360 ml  Output 450 ml  Net -90 ml    Physical Exam  General:  Well appearing. No resp difficulty Neck: supple. JVP ~6.  Cor: Mechanical heart sounds with LVAD hum present. Lungs: Clear Driveline: C/D/I; securement device intact and driveline incorporated Extremities: no cyanosis, clubbing, rash, edema Neuro: alert & oriented x3. Moves all 4 extremities w/o difficulty. Affect pleasant  Labs    CBC Recent Labs    02/14/24 0554 02/15/24 0455  WBC 3.9* 3.8*  HGB 11.7* 11.8*  HCT 35.9* 36.7*  MCV 90.4 90.8  PLT 238 224   Basic Metabolic Panel Recent Labs    40/98/11 0554  NA 135  K 4.3  CL 108  CO2 22  GLUCOSE 95  BUN 31*  CREATININE 2.18*  CALCIUM  8.6*   Liver Function Tests No results for input(s): "AST", "ALT", "ALKPHOS", "BILITOT", "PROT", "ALBUMIN " in the last 72 hours.  BNP  (last 3 results) Recent Labs    02/05/24 1106  BNP 343.8*   Imaging   No results found.  Medications:    Scheduled Medications:  feeding supplement  237 mL Oral BID BM   furosemide   40 mg Oral Daily   magnesium  oxide  400 mg Oral Daily   pantoprazole   40 mg Oral Daily   polyethylene glycol  17 g Oral Daily   rosuvastatin   10 mg Oral QHS   senna-docusate  2 tablet Oral BID   sildenafil   20 mg Oral TID   Warfarin - Pharmacist Dosing Inpatient   Does not apply q1600   Infusions:   PRN Medications: acetaminophen , ondansetron  (ZOFRAN ) IV, ondansetron , mouth rinse, mouth rinse, sorbitol   Patient Profile   Isaac [name] is a 73 y.o. male with a history of VT, NICM, s/p ICD, LVAD, afib, CKD IV, previously requiring HD, and CAD s/p PCI.   Assessment/Plan  1. Acute CVA: ?embolic - CT brain 5/17: + for acute to subacute L PCA and Left cerebellar infarcts. L PCA occlusion. No hemorrhagic transformation or mass effect.  - CT brain 5/18: evolution of L PCA, L cerebellar and R MCA strokes. No hemorrhage - Echo w/o clear source of embolization - Goal INR increased 2.5-3.0. Warfarin dosing per PharmD. INR 2.8 today, per EP will  try to keep INR no higher than 2.7.  - now admitted to CIR; working with PT; he is getting stronger.  - Deficits appear to be improving (speech, right-sided weakness).    2. Chronic systolic CHF:   - Long-standing cardiomyopathy. [employer] ICD s/p gen change 5/21. S/p HM-3 VAD 09/17/21.   - NYHA I at baseline - VAD interrogated personally. Stable parameters. - MAP stable.  - Weight trending up and PO intake has improved. Will increase lasix  back up to home dose. Increase lasix  from 40 mg every other day to daily.  - Continue sildenafil  20 mg daily for RV dysfunction - Pressure dressing remains in place from generator change.  - EP checked ICD site today, looks stable with minimal swelling.    3. Tricuspid regurgitation:   - Tricuspid repair  not done at time of VAD due to proximity of ICD wires and hypotension during surgery.  TR read as normal on echo but I reviewed and suspect that it remains severe.    4. Chronic Atrial fibrillation: - off amiodarone  with resolution of nausea. - ICD dependent   5. CKD stage IV:  - baseline Scr ~2.5.  - Scr stable at 2.18 - Follows with nephrology.    6. CAD: History of PCI to OM1 in 2007 and RCA in 2013.   - continue statin   7. GERD with coffee ground emesis - continue PPI  8. ICD site hematoma - Mild ICD site hematoma, evaluated 5/25 by Dr. Carolynne Citron. EP APP saw today too. Plan to continue to monitor.  - INR goal has been 2.5-3, for now would try to keep INR no higher than 2.7.   I reviewed the LVAD parameters from today, and compared the results to the patient's prior recorded data.  No programming changes were made.  The LVAD is functioning within specified parameters.  The patient performs LVAD self-test daily.  LVAD interrogation was negative for any significant power changes, alarms or PI events/speed drops.  LVAD equipment check completed and is in good working order.  Back-up equipment present.   LVAD education done on emergency procedures and precautions and reviewed exit site care.  Length of Stay: 5  Sheryl Donna, NP  02/15/2024, 8:55 AM  VAD Team Pager [phone] (7am - 7am)  Advanced Heart Failure Team Pager [phone] (M-F; 7a - 5p)  Please contact [facility] for night-coverage after hours (5p -7a ) and weekends on [email or web address]

## 2024-02-15 NOTE — Progress Notes (Signed)
 EP Wound Review    Patient assessed at bedside. Left upper chest / PPM site with steri-strips in place. Small amt of old/dried blood under strips.  Small pocket hematoma, soft.  Pt denies significant change at site.      Monitor for change of size / increased hematoma. Do not get site wet until after wound check / visit.  No lotions / creams / ointments on site. Leave steri-strips intact. Goal INR therapeutic but < 2.7 in the few weeks post implant.    EP available PRN. Please call with questions / new needs arise.    Creighton Doffing, NP-C, AGACNP-BC [facility] - Electrophysiology  02/15/2024, 7:49 AM

## 2024-02-15 NOTE — Progress Notes (Signed)
 LVAD Coordinator Rounding Note:  Pt admitted 02/05/24 for altered mental status. Code Stroke activated. CT showed Left PCA and left cerebellar infarct. Pt not candidate for thrombolytic therapy or IR due to the occlusion being too distal.  STAT CT head 1. 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 Hyperdense Left PCA suggests occlusion of that vessel. No hemorrhagic transformation or mass effect. 2. Underlying advanced chronic small vessel disease. 3. Right supraorbital/forehead recent soft tissue injury with no skull fracture identified.  Working with PT on my arrival. Pt seems to be in good spirits today.  Left upper chest / PPM site with steri-strips in place. Small amt of old/dried blood under strips. Small pocket hematoma, soft. Pt denies significant change at site.   Goal INR now 2.5-3.0 per Neurology.  Vital signs: Temp: 98 HR: 60 V paced Doppler Pressure: 88 Auto Cuff BP: 98/77 (85) O2 Sat: 99% on RA Wt: 184>183.2>182.9>187.3>178.3>177.9>176.2>180.5 lbs    LVAD interrogation reveals:  Speed: 5300 Flow: 4.6 Power: 4 w PI: 3.1 Hematocrit: 36  Alarms: none Events: rare  Fixed speed: 5300 Low speed limit: 5000  Drive Line:  Existing VAD dressing C/D/I with anchor intact and accurately applied. Weekly dressing changes per VAD coordinator, or bedside nurse. Next dressing change due: 02/19/24 by caregiver.  Labs:  LDH trend: 161>175>154>169>158>159  INR trend: 2.2>2.6>2.3>2.7>2.5>2.4>2.1>2.8  Anticoagulation Plan: -INR Goal: 2.5-3.0  Device: - Cabin crew - V paced 60 -Therapies: OFF  Infection:   Plan/Recommendations:  Page VAD coordinator for drive line or equipment concerns 2. Weekly dressing changes per VAD Coordinator or caregiver.  Adams Adams RN,BSN VAD Coordinator  Office: [phone]  24/7 Pager: [phone]

## 2024-02-15 NOTE — Progress Notes (Signed)
 Pt and pt wife were expressing concerns about having scheduled bowel medications changed to prn. Wife expressed concerns about pt not being fully aware to request not getting stool medications during the day and would like to have these medications changed to prn to avoid pt receiving these meds while she is not present. Reassured both pt and wife that I would let oncoming day shift nurse know until further adjustments were made. No further concerns at this time, call bell in reach, bed alarm on.  [place], LPN

## 2024-02-15 NOTE — Progress Notes (Signed)
 Speech Language Pathology Daily Session Note  Patient Details  Name: Isaac [name] MRN: [id number] Date of Birth: [birth year]  Today's Date: 02/15/2024 SLP Individual Time: 4098-1191 SLP Individual Time Calculation (min): 40 min  Short Term Goals: Week 1: SLP Short Term Goal 1 (Week 1): Patient will orient to date and situation in 4/5 opportunities using external aids as needed when provided with mod assist. SLP Short Term Goal 2 (Week 1): Patient will attend to right side of functional tasks with visual components in 80% of trials given mod assist. SLP Short Term Goal 3 (Week 1): Patient will attend to functional therapy tasks for 10 minutes given overall mod assist throughout. SLP Short Term Goal 4 (Week 1): Patient will utilize basic word finding strategies during generative and responsive naming tasks to name items with 80% accuracy given mod assist. SLP Short Term Goal 5 (Week 1): Patient will solve basic functional problems with 80% accuracy given mod assist.  Skilled Therapeutic Interventions: SLP conducted skilled therapy session targeting cognitive goals. SLP facilitated task targeting following direction, working memory, switching cognitive set, and scanning. Given patient's visual field deficits and right inattention, patient has difficulty scanning to the right side of the page. Devised several helpful strategies including utilizing digital scanning with finger to promote increased thoroughness and placing large black line on the right side of the page to indicate patient has reached the end of the page. Patient benefited from mod encouragement to be consistent with these strategies. Patient also benefited from min to mod assist for working memory throughout.  Patient was left in room with call bell in reach and alarm set. SLP will continue to target goals per plan of care.        Pain Pain Assessment Pain Scale: 0-10 Pain Score: 0-No pain  Therapy/Group: Individual  Therapy  Lareina Espino, M.A., CCC-SLP  Draedyn Weidinger A Seirra Kos 02/15/2024, 2:13 PM

## 2024-02-15 NOTE — Progress Notes (Signed)
 Occupational Therapy Session Note  Patient Details  Name: Isaac [name] MRN: [id number] Date of Birth: 05-16-1951  Today's Date: 02/15/2024 OT Individual Time: 1105-1200 & 1420-1445 OT Individual Time Calculation (min): 55 min & 25 min   Short Term Goals: Week 1:  OT Short Term Goal 1 (Week 1): Pt will complete toilet transfer with CGA using LRAD OT Short Term Goal 2 (Week 1): Pt will visually scan to R side during ADL task with min cues OT Short Term Goal 3 (Week 1): Pt will transfer LVAD from main power source to battery packs with min A OT Short Term Goal 4 (Week 1): Pt will keep LVAD battery/drive line in safe place with min verbal cues about 75% of the time  Skilled Therapeutic Interventions/Progress Updates:  Session 1 Skilled OT intervention completed with focus on ADL retraining, functional transfers, R visual scanning. Pt received slumped over in bed asleep. Easily woken. Pt attempted to exit L side of bed that had bed rail up and was entangled in LVAD wires. Cues needed to transition to R side and for awareness of LVAD lines. Pt agreeable to session. No pain reported.  Offered pt self-care, in which pt opted to bathe/dress. Seated EOB pt able to doff shirt without assist. Able to wash UB with supervision, however noted to have delayed processing and increased time needed for scanning to R side due to visual field cut. Emphasis on pt self-initiating scanning to R and noted to have difficulty seeing objects even in midline or slightly to R. Applied lotion around taped areas due to pt c/o continued itchiness. Donned shirts with set up A. Reported need for void. CGA sit > stand and for balance during self-placement of urinal; continent of void. Completed multiple sit > stands at [facility] level without AD, with assist needed for managing HM3 LVAD pack to maintain integrity of drive line with standing. Threaded LB clothing without assist. Donned shoes with set up A.   Assist needed to apply  shoulder strap for HM3 unit, then min A to donn over shoulder in prep for mobility. CGA stand pivot without AD > w/c. Pt remained seated in w/c, with belt alarm on/activated, and with all needs in reach at end of session.  Session 2 Skilled OT intervention completed with focus on BUE strengthening and functional transfers. Pt received seated in w/c, agreeable to session. LUQ pain reported at defibrillator site with certain movements- improved with modifications and repositioning.  Completed CGA sit > stand without AD and CGA stand pivot > EOB. Seated EOB, pt completed the following BUE exercises to promote BUE strength/endurance needed for independence with functional transfers and BADLs: (With red theraband) 2x15 reps Self-anchored bicep flexion each arm Self-anchored tricep extension each arm Therapist anchored scapular retraction each arm Shoulder external rotation -Pt observed to have limitations in L shoulder due to LUQ pain, therefore modified to exercises below shoulder height and close to midline for incision integrity  Doffed shoes supervision, transitioned sit > supine with supervision. Doffed LVAD shoulder strap with mod A. Pt remained semi upright in bed, with bed alarm on/activated, and with all needs in reach at end of session.   Therapy Documentation Precautions:  Precautions Precautions: Fall Precaution/Restrictions Comments: LVAD, R neglect, dressing over defibrillator Restrictions Weight Bearing Restrictions Per Provider Order: No    Therapy/Group: Individual Therapy  Ruthanna Covert, MS, OTR/L  02/15/2024, 3:38 PM

## 2024-02-15 NOTE — Progress Notes (Signed)
 PROGRESS NOTE   Subjective/Complaints:  Patient going to work out at [employer] today.  No acute events noted overnight.  LBM yesterday-wife reported it to be large  ROS: as per HPI. Denies CP, SOB, abd pain, N/V/D/C, or any other complaints at this time.   + Leg swelling +weakness- improved + insomnia- improved  Objective:   No results found. Recent Labs    02/14/24 0554 02/15/24 0455  WBC 3.9* 3.8*  HGB 11.7* 11.8*  HCT 35.9* 36.7*  PLT 238 224   Recent Labs    02/14/24 0554  NA 135  K 4.3  CL 108  CO2 22  GLUCOSE 95  BUN 31*  CREATININE 2.18*  CALCIUM  8.6*    Intake/Output Summary (Last 24 hours) at 02/15/2024 1358 Last data filed at 02/15/2024 1329 Gross per 24 hour  Intake 480 ml  Output 650 ml  Net -170 ml        Physical Exam: Vital Signs Blood pressure 95/69, pulse 64, temperature 97.7 F (36.5 C), temperature source Oral, resp. rate 17, height 5\' 9"  (1.753 m), weight 81.9 kg, SpO2 99%.  General: No apparent distress, working with therapy HEENT: Head is normocephalic, atraumatic, mild right facial weakness, mucous membranes moist, wearing glasses Heart: LVAD hum present Chest: CTA bilaterally, nonlabored, L upper chest steristrips- looks ok  Abdomen: Soft, non-tender, nondistended, + bowel sounds a little hypoactive. Extremities: No clubbing, cyanosis, or edema.  Psych: Appropriate, cooperative Skin: L upper chest bandage as above. L abdomen LVAD drive site c/d/I. Clean and intact without signs of breakdown  Neuro: Patient is alert.  Mild left gaze preference.  Follows simple commands. Speech less delayed, cognition appears improved Expressive aphasia -improving Fair balance ambulating in the hallway with therapy Moving all 4 extremities to gravity and resistance  Exam appears consistent from prior 5/27  MSK: no joint tenderness noted  Prior Exam RUE- 4/5 throughout however has R  sided neglect- hard to access LUE- 5/5 except FA 4+/5 as well as grip 5-/5 RLE- HF 2/5; KE/KF 4-/5; DF 4-/5 and PF 4/5 LLE- 5/5 throughout       Assessment/Plan: 1. Functional deficits which require 3+ hours per day of interdisciplinary therapy in a comprehensive inpatient rehab setting. Physiatrist is providing close team supervision and 24 hour management of active medical problems listed below. Physiatrist and rehab team continue to assess barriers to discharge/monitor patient progress toward functional and medical goals  Care Tool:  Bathing    Body parts bathed by patient: Right arm, Left arm, Chest, Abdomen   Body parts bathed by helper: Buttocks, Right lower leg, Left lower leg     Bathing assist Assist Level: Supervision/Verbal cueing     Upper Body Dressing/Undressing Upper body dressing   What is the patient wearing?: Pull over shirt, Orthosis Orthosis activity level: Performed by helper  Upper body assist Assist Level: Minimal Assistance - Patient > 75%    Lower Body Dressing/Undressing Lower body dressing      What is the patient wearing?: Incontinence brief, Pants     Lower body assist Assist for lower body dressing: Moderate Assistance - Patient 50 - 74%     Toileting  Toileting    Toileting assist Assist for toileting: Contact Guard/Touching assist     Transfers Chair/bed transfer  Transfers assist     Chair/bed transfer assist level: Contact Guard/Touching assist     Locomotion Ambulation   Ambulation assist      Assist level: Contact Guard/Touching assist Assistive device: No Device Max distance: 150   Walk 10 feet activity   Assist     Assist level: Contact Guard/Touching assist Assistive device: No Device   Walk 50 feet activity   Assist    Assist level: Contact Guard/Touching assist Assistive device: No Device    Walk 150 feet activity   Assist Walk 150 feet activity did not occur: Safety/medical  concerns  Assist level: Contact Guard/Touching assist Assistive device: No Device    Walk 10 feet on uneven surface  activity   Assist Walk 10 feet on uneven surfaces activity did not occur: Safety/medical concerns         Wheelchair     Assist Is the patient using a wheelchair?: Yes Type of Wheelchair: Manual Wheelchair activity did not occur: Safety/medical concerns  Wheelchair assist level: Dependent - Patient 0% Max wheelchair distance: 150    Wheelchair 50 feet with 2 turns activity    Assist        Assist Level: Dependent - Patient 0%   Wheelchair 150 feet activity     Assist      Assist Level: Dependent - Patient 0%   Blood pressure 95/69, pulse 64, temperature 97.7 F (36.5 C), temperature source Oral, resp. rate 17, height 5\' 9"  (1.753 m), weight 81.9 kg, SpO2 99%.  Medical Problem List and Plan: 1. 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  Antithrombotics: -DVT/anticoagulation:  Pharmaceutical: Coumadin  per pharmacy.  Goal INR 2.5-3.0 due to recent CVA             -antiplatelet therapy: N/A 3. Pain Management: tylenol  PRN 4. Mood/Behavior/Sleep: Provide emotional support             -antipsychotic agents: N/A 5. Neuropsych/cognition: This patient is NOT capable of making decisions on his own behalf. 6. Skin/Wound Care: Routine skin checks and LVAD team following for line drive, etc -1/61/09 nursing stating pt concerned with L chest wall dressing, but pt states he's not concerned-- LVAD team to address this bandage, it looks fine to me-- no mention of concern in notes -5/26 L chest wall  dressing appears dry/intact 7. Fluids/Electrolytes/Nutrition: Routine in and outs with follow-up chemistries, continue vitamins/supplements.  8.  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  Continue Sildenafil  20mg  TID.  Followed by heart failure team- appreciate assistance.  Status post generator change 02/09/2024- team wants pressure dressing "for as long as possible"- due to coumadin   -Continue Lasix  40mg  every other day -02/13/24 noticed that LDH hadn't been done since 5/22, added those on daily 9.  CKD stage III.  Follow-up chemistries  -5/23 BUN and creatinine stable at 2.2/23- - stable  02/14/24 although BUN a little higher at 31, continue to monitor 10.  Remote tobacco use.  Provide counseling 11.  Hyperlipidemia.  Crestor  10mg  nightly 12. Severe constipation- continue senokot 2 tabs BID, miralax  daily; will need to order Sorbitol  tomorrow after therapy- since has been at least 8 days since LBM.   - 5/23 LBM today-large recorded  -02/13/24 no BM in 2 days but last one was large; monitor for now  5/27 LBM yesterday, continue current regimen, will change Senokot to as needed as he is declining 13. Patient wants a haircut- usually wears hair "bald"- and shaves at home and on coumadin - please evaluate if possible.  14.  Insomnia.  Wife declines any medication changes tonight.  Would consider tomorrow 15.  Sundowning/delirium.  Avoid medications that worsen cognition.   -improved, he is sleeping better 16. GI ppx: continue protonix  40mg  daily  17. Hypotension: -5/26-7 BP soft but stable, HF team following  Vitals:   02/12/24 1633 02/12/24 1837 02/12/24 2116 02/13/24 0356  BP: (!) 85/63 (!) 84/63 92/72 (!) 88/74   02/13/24 1805 02/14/24 0414 02/14/24 1502 02/14/24 2014  BP: 91/73 96/77 98/86  100/84   02/14/24 2141 02/14/24 2151 02/15/24 0411 02/15/24 1318  BP: 94/70 90/62 98/77  95/69    18. Anemia: -02/13/24 Hgb down to 11.6 from  12-13 range the last week-- but was 11.6 on 5/18 also; would monitor very closely, if continued drop, then would occult test stool. CBC ordered daily as of now so will be checked tomorrow -5/27 HGB stable 11.8  LOS: 5 days A FACE TO FACE EVALUATION WAS PERFORMED  Isaac [name] 02/15/2024, 1:58 PM

## 2024-02-16 DIAGNOSIS — I959 Hypotension, unspecified: Secondary | ICD-10-CM | POA: Diagnosis not present

## 2024-02-16 DIAGNOSIS — I63532 Cerebral infarction due to unspecified occlusion or stenosis of left posterior cerebral artery: Secondary | ICD-10-CM | POA: Diagnosis not present

## 2024-02-16 DIAGNOSIS — K5900 Constipation, unspecified: Secondary | ICD-10-CM | POA: Diagnosis not present

## 2024-02-16 DIAGNOSIS — N1832 Chronic kidney disease, stage 3b: Secondary | ICD-10-CM | POA: Diagnosis not present

## 2024-02-16 DIAGNOSIS — Z95811 Presence of heart assist device: Secondary | ICD-10-CM | POA: Diagnosis not present

## 2024-02-16 LAB — PROTIME-INR
INR: 2.6 — ABNORMAL HIGH (ref 0.8–1.2)
Prothrombin Time: 28.3 s — ABNORMAL HIGH (ref 11.4–15.2)

## 2024-02-16 LAB — LACTATE DEHYDROGENASE: LDH: 177 U/L (ref 98–192)

## 2024-02-16 MED ORDER — SENNOSIDES-DOCUSATE SODIUM 8.6-50 MG PO TABS: 2.0000 | ORAL_TABLET | Freq: Two times a day (BID) | ORAL | Status: DC

## 2024-02-16 MED ORDER — SENNOSIDES-DOCUSATE SODIUM 8.6-50 MG PO TABS: 2.0000 | ORAL_TABLET | Freq: Two times a day (BID) | ORAL | Status: DC | PRN

## 2024-02-16 MED ORDER — POLYETHYLENE GLYCOL 3350 17 G PO PACK: 17.0000 g | PACK | Freq: Every day | ORAL | Status: DC | PRN

## 2024-02-16 MED ORDER — SORBITOL 70 % SOLN: 30.0000 mL | Freq: Every day | Status: DC | PRN

## 2024-02-16 NOTE — Progress Notes (Signed)
 LVAD Coordinator Rounding Note:  Pt admitted 02/05/24 for altered mental status. Code Stroke activated. CT showed Left PCA and left cerebellar infarct. Pt not candidate for thrombolytic therapy or IR due to the occlusion being too distal.  STAT CT head 1. 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 Hyperdense Left PCA suggests occlusion of that vessel. No hemorrhagic transformation or mass effect. 2. Underlying advanced chronic small vessel disease. 3. Right supraorbital/forehead recent soft tissue injury with no skull fracture identified.  Pt with a visitor on my arrival. Pt seems to be in good spirits today. Pt took a little prompting today with who I was, but eventually was able to communicate my name and role. He laughs at several of my questions today but can't answer the questions. His son in the room tells me that he will be tentatively d/c on 6/4.  Left upper chest / PPM site with steri-strips in place. Small amt of old/dried blood under strips. Small pocket hematoma, soft. Pt denies significant change at site.   Goal INR now 2.5-3.0 per Neurology.  Vital signs: Temp: 97.5 HR: 61 V paced Doppler Pressure: 88 Auto Cuff BP: 106/94 (99) O2 Sat: 100% on RA Wt: 184>183.2>182.9>187.3>178.3>177.9>176.2>180.5>179.9 lbs    LVAD interrogation reveals:  Speed: 5300 Flow: 4.6 Power: 3.9 w PI: 3.3 Hematocrit: 36  Alarms: none Events: none  Fixed speed: 5300 Low speed limit: 5000  Drive Line:  Existing VAD dressing C/D/I with anchor intact and accurately applied. Weekly dressing changes per VAD coordinator, or bedside nurse. Next dressing change due: 02/19/24 by caregiver.  Labs:  LDH trend: 161>175>154>169>158>159>177  INR trend: 2.2>2.6>2.3>2.7>2.5>2.4>2.1>2.8>2.6  Anticoagulation Plan: -INR Goal: 2.5-3.0  Device: - Cabin crew - V paced 60 -Therapies: OFF  Infection:   Plan/Recommendations:  Page VAD coordinator for drive  line or equipment concerns 2. Weekly dressing changes per VAD Coordinator or caregiver.  Adams Adams RN,BSN VAD Coordinator  Office: [phone]  24/7 Pager: [phone]

## 2024-02-16 NOTE — Progress Notes (Signed)
 Physical Therapy Session Note  Patient Details  Name: Isaac [name] MRN: [id number] Date of Birth: [birth year]  Today's Date: 02/15/2024 PT Individual Time: 0804-0910 PT Individual Time Calculation (min): 66 min   Short Term Goals: Week 1:  PT Short Term Goal 1 (Week 1): pt will navigate stairs with CGA PT Short Term Goal 2 (Week 1): Pt will require min a for LVAD batteries PT Short Term Goal 3 (Week 1): Pt will ambulate >150 ft with RW  Skilled Therapeutic Interventions/Progress Updates:  Patient supine in bed on entrance to room. Patient alert and agreeable to PT session. RN enters with morning medications and request for pt to test LVAD portable power unit. Pt unable to initiate without further cueing from RN.   Patient with no pain complaint at start of session.  Therapeutic Activity: Bed Mobility: Pt performed supine > sit with supervision to L side of bed. VC/ tc required for reaching upright seated position. While seated EOB, questioned pt re: what is needed next in order to leave room. Pt relates need to dress and provided with laid out clothing. Able to don pants and t-shirt with supervision. Then asked re: next steps in order to leave room and pt unable to answer. Cued re: switching from wall unit power to battery power. Asked pt next steps to initiate this. Unable to relate. Places strap of power unit cross body with unit to L side. Vuisually cued with presentation of battery connectors. Pt relates need to connect batteries. Provided with connectors  and batteries laid out on bed. Unable to initiate. Cued patient verbally for next steps and is able to disconnect wall unit with cue to complete with black connector first and then cued to complete with white connector. Then asked if he wanted to wear vest or wear arm straps provided by LVAD unit. Pt states vest and dons vest.   Wife enters room and upon watching pt attempt to don, relates her frustration re: having different  therapist each day with no one understanding specific steps to don all vest correctly and with power unit and batteries in correct pockets. Pt demos frustration with body language. Related to wife that pt has requires significant cueing for steps up to this point but physically can perform. She relates that she is in charge of dressing changes and portal and does not prefer for power unit to be on strap and removes. Then instructs pt to remove vest as he is incorrect for posterior running of line to R side. Discussed with wife that pt is performing but not incorrect - just not performing as to PLOF. Attempt to relate that pt's stroke has affected memory and problem solving. Pt is slow to problem solve but is initiating thoughts. Wife continues to relate that she does not prefer him to attempt to problem solve while performing incorrectly. Again related difference between meanings of "incorrect". Vest laid out on bed and pt unable to determine placement of parts into appropriate pockets according to wife. Wife assertive with pt on attempt to guide correct placement of parts. This therapist provides pt with MinA and cues for correct pockets to complete and don vest.  Transfers: Pt performed sit<>stand and stand pivot transfers throughout session with supervision. Provided vc/ tc for safe upright balance and need for grip on feet to floor and need to don shoes prior to ambulating in room in hospital for improved safety.   Gait Training:  Pt ambulated from room to main gym >200' x2  using no AD with overall light CGA/ supervision. Seate rest break in gym and then able to perform stairs x3 using BHR with overall close supervision/ CGA and continuous intructions at top of steps for safe turn and descent.   Neuromuscular Re-ed: NMR facilitated during session with focus on standing balance, visual scanning, attn to R side of visual field. Pt guided in ambulation in hallway and requested stop in front of visually  located targets. Pointed out gel dispenser to pt's L side and request pt to stop in front of next dispenser to R side with pt understanding of instructions. Pt passes 3 different dispensers. Pointed out to pt and he laughs. Request pt to stop in front of link sign on L side requiring facilitation to direction. In front of sign, asked pt to read words that are circled on sign. At 2 different signs. Pt unable to locate circled words and attempting to read other words on sign. Pointed out circled words and asked pt to follow therapist's fingers and related letters/ words from L to R. Is able to figure out first word after several tries. Difficulty with second word on both signs.    NMR performed for improvements in motor control and coordination, balance, sequencing, judgement, and self confidence/ efficacy in performing all aspects of mobility at highest level of independence.   Patient supine in bed at end of session with brakes locked, bed alarm set, and all needs within reach. Wife returning with breakfast obtained from [facility].    Therapy Documentation Precautions:  Precautions Precautions: Fall Precaution/Restrictions Comments: LVAD, R neglect, dressing over defibrillator Restrictions Weight Bearing Restrictions Per Provider Order: No  Pain:  No pain related this session.   Therapy/Group: Individual Therapy  Donne Gage PT, DPT, CSRS 02/15/2024, 6:45 PM

## 2024-02-16 NOTE — Progress Notes (Signed)
 PROGRESS NOTE   Subjective/Complaints:  No new complaints this AM. Chart review indicates wife would like his bowel medications changed to PRN. Pt told therapy this am his defibrillator felt bigger.  He did not mention this to me initially and when I asked him about it he said he thought It had maybe felt a little different but not painful.    ROS: as per HPI. Denies fever, CP, SOB, abd pain, N/V/D or any other complaints at this time.   + Leg swelling +weakness- improved + insomnia- improved Denies feeling constipated  Objective:   No results found. Recent Labs    02/14/24 0554 02/15/24 0455  WBC 3.9* 3.8*  HGB 11.7* 11.8*  HCT 35.9* 36.7*  PLT 238 224   Recent Labs    02/14/24 0554  NA 135  K 4.3  CL 108  CO2 22  GLUCOSE 95  BUN 31*  CREATININE 2.18*  CALCIUM  8.6*    Intake/Output Summary (Last 24 hours) at 02/16/2024 1206 Last data filed at 02/16/2024 0820 Gross per 24 hour  Intake 240 ml  Output 200 ml  Net 40 ml        Physical Exam: Vital Signs Blood pressure 96/72, pulse (!) 56, temperature 98.4 F (36.9 C), temperature source Oral, resp. rate 17, height 5\' 9"  (1.753 m), weight 81.6 kg, SpO2 99%.  General: No apparent distress, working with therapy HEENT: Head is normocephalic, atraumatic, mild right facial weakness, mucous membranes moist, wearing glasses Heart: LVAD hum present Chest: CTA bilaterally, nonlabored, L upper chest steristrips- looks ok  Abdomen: Soft, non-tender, nondistended, + bowel sounds a little hypoactive. Extremities: No clubbing, cyanosis, or edema.  Psych: Appropriate, cooperative Skin: L upper chest bandage as above. Not tender. L abdomen LVAD drive site c/d/I.  Clean and intact without signs of breakdown.   Neuro: Patient is alert.  Mild left gaze preference.  Right inattention.  Follows simple commands. Speech less delayed, cognition appears improved Expressive  aphasia -improving Moving all 4 extremities to gravity and resistance  Exam appears consistent from prior 5/27  MSK: no joint tenderness noted  Prior Exam RUE- 4/5 throughout however has R sided neglect- hard to access LUE- 5/5 except FA 4+/5 as well as grip 5-/5 RLE- HF 2/5; KE/KF 4-/5; DF 4-/5 and PF 4/5 LLE- 5/5 throughout       Assessment/Plan: 1. Functional deficits which require 3+ hours per day of interdisciplinary therapy in a comprehensive inpatient rehab setting. Physiatrist is providing close team supervision and 24 hour management of active medical problems listed below. Physiatrist and rehab team continue to assess barriers to discharge/monitor patient progress toward functional and medical goals  Care Tool:  Bathing    Body parts bathed by patient: Right arm, Left arm, Chest, Abdomen   Body parts bathed by helper: Buttocks, Right lower leg, Left lower leg     Bathing assist Assist Level: Supervision/Verbal cueing     Upper Body Dressing/Undressing Upper body dressing   What is the patient wearing?: Pull over shirt, Orthosis Orthosis activity level: Performed by helper  Upper body assist Assist Level: Minimal Assistance - Patient > 75%    Lower Body Dressing/Undressing Lower  body dressing      What is the patient wearing?: Incontinence brief, Pants     Lower body assist Assist for lower body dressing: Moderate Assistance - Patient 50 - 74%     Toileting Toileting    Toileting assist Assist for toileting: Contact Guard/Touching assist     Transfers Chair/bed transfer  Transfers assist     Chair/bed transfer assist level: Contact Guard/Touching assist     Locomotion Ambulation   Ambulation assist      Assist level: Contact Guard/Touching assist Assistive device: No Device Max distance: 150   Walk 10 feet activity   Assist     Assist level: Contact Guard/Touching assist Assistive device: No Device   Walk 50 feet  activity   Assist    Assist level: Contact Guard/Touching assist Assistive device: No Device    Walk 150 feet activity   Assist Walk 150 feet activity did not occur: Safety/medical concerns  Assist level: Contact Guard/Touching assist Assistive device: No Device    Walk 10 feet on uneven surface  activity   Assist Walk 10 feet on uneven surfaces activity did not occur: Safety/medical concerns         Wheelchair     Assist Is the patient using a wheelchair?: Yes Type of Wheelchair: Manual Wheelchair activity did not occur: Safety/medical concerns  Wheelchair assist level: Dependent - Patient 0% Max wheelchair distance: 150    Wheelchair 50 feet with 2 turns activity    Assist        Assist Level: Dependent - Patient 0%   Wheelchair 150 feet activity     Assist      Assist Level: Dependent - Patient 0%   Blood pressure 96/72, pulse (!) 56, temperature 98.4 F (36.9 C), temperature source Oral, resp. rate 17, height 5\' 9"  (1.753 m), weight 81.6 kg, SpO2 99%.  Medical Problem List and Plan: 1. 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 Formulized individual treatment plan based on medical history, underlying problem and comorbidities.   2.  Antithrombotics: -DVT/anticoagulation:  Pharmaceutical: Coumadin  per pharmacy.  Goal INR 2.5-3.0 due to recent CVA             -antiplatelet therapy: N/A 3. Pain Management: tylenol  PRN 4. Mood/Behavior/Sleep: Provide emotional  support             -antipsychotic agents: N/A 5. Neuropsych/cognition: This patient is NOT capable of making decisions on his own behalf. 6. Skin/Wound Care: Routine skin checks and LVAD team following for line drive, etc -1/61/09 nursing stating pt concerned with L chest wall dressing, but pt states he's not concerned-- LVAD team to address this bandage, it looks fine to me-- no mention of concern in notes -5/26 L chest wall dressing appears dry/intact -5/28 patient reports device fell bigger when he was speaking with therapy.  He has not appear to be tender in  this area no abnormalities noted.  Discussed with his heart failure team will continue to monitor. 7. Fluids/Electrolytes/Nutrition: Routine in and outs with follow-up chemistries, continue vitamins/supplements.  8.  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  Continue Sildenafil  20mg  TID.  Followed by heart failure team- appreciate assistance.  Status post generator change 02/09/2024- team wants pressure dressing "for as long as possible"- due to coumadin   -Continue Lasix  40mg  every other day -02/13/24 noticed that LDH hadn't been done since 5/22, added those on daily 9.  CKD stage III.  Follow-up chemistries  -5/23 BUN and creatinine stable at 2.2/23- - stable 02/14/24 although BUN a little higher at 31, continue to monitor -Recheck tomorrow  10.  Remote tobacco use.  Provide counseling 11.  Hyperlipidemia.  Crestor  10mg  nightly 12. Severe constipation- continue senokot 2 tabs BID, miralax  daily; will need to order Sorbitol  tomorrow after therapy- since has been at least 8 days since LBM.   - 5/23 LBM today-large recorded  -02/13/24 no BM in 2 days but last one was large; monitor for now  5/27 LBM yesterday, continue current regimen, will change Senokot to as needed as he is declining  5/28 will change bowel medication to PRN per family request 13. Patient wants a haircut-  usually wears hair "bald"- and shaves at home and on coumadin - please evaluate if possible.  14.  Insomnia.  Wife declines any medication changes tonight.  Would consider tomorrow 15.  Sundowning/delirium.  Avoid medications that worsen cognition.   -improved, he is sleeping better 16. GI ppx: continue protonix  40mg  daily  17. Hypotension: -5/26-28 BP soft but stable, HF team following  Vitals:   02/13/24 0356 02/13/24 1805 02/14/24 0414 02/14/24 1502  BP: (!) 88/74 91/73 96/77  98/86   02/14/24 2014 02/14/24 2141 02/14/24 2151 02/15/24 0411  BP: 100/84 94/70 90/62  98/77   02/15/24 1318 02/15/24 2002 02/16/24 0512 02/16/24 0650  BP: 95/69 91/80 101/74 96/72    18. Anemia: -02/13/24 Hgb down to 11.6 from 12-13 range the last week-- but was 11.6 on 5/18 also; would monitor very closely, if continued drop, then would occult test stool. CBC ordered daily as of now so will be checked tomorrow -5/27 HGB stable 11.8 Recheck tomorrow  LOS: 6 days A FACE TO FACE EVALUATION WAS PERFORMED  Lylia Sand 02/16/2024, 12:06 PM

## 2024-02-16 NOTE — Progress Notes (Signed)
 Occupational Therapy Session Note  Patient Details  Name: Isaac [name] MRN: [id number] Date of Birth: 28-Sep-1950  Today's Date: 02/16/2024 OT Individual Time: 4098-1191 & 1105-1130 OT Individual Time Calculation (min): 70 min & 25 min   Short Term Goals: Week 1:  OT Short Term Goal 1 (Week 1): Pt will complete toilet transfer with CGA using LRAD OT Short Term Goal 2 (Week 1): Pt will visually scan to R side during ADL task with min cues OT Short Term Goal 3 (Week 1): Pt will transfer LVAD from main power source to battery packs with min A OT Short Term Goal 4 (Week 1): Pt will keep LVAD battery/drive line in safe place with min verbal cues about 75% of the time  Skilled Therapeutic Interventions/Progress Updates:  Session 1 Skilled OT intervention completed with focus on ADL retraining, functional transfers, R visual scanning, CVA education. Pt received upright in bed, agreeable to session. No pain reported. Pt did report that LUQ defibrillator "feels funny" like "bigger" but when asked to give additional descriptors, pt was unable to do so. Pt denied pain and LVAD monitor WNL. Team notified for monitoring today.  Pt alert and oriented x2, with assist needed to remember he had a stroke. Pt unable to describe what a CVA is- education offered to pt about the occurrence and it's cardioembolic nature as well as BEFAST acronym. Pt asked if he caused his CVA- discussed his increased risk of CVA 2/2 LVAD as well other contributors that "can" but that he didn't cause it to specifically happen.  Utilized questioning cues throughout session for increasing awareness to R side and for LVAD lines. Max cues needed for pt to take LVAD monitor with him in prep for bed mobility. Transitioned semi upright > sit with supervision. Able to doff clothing with supervision, bathe with CGA/supervision at the sit > stand level with intermittent cues for R visual scanning for items and for safety. Donned all  clothing supervision. Min A for donning shoulder strap and LVAD monitor. CGA stand pivot > w/c.  Had pt try to teach back proper steps of LVAD transfer from wall > battery pack, however pt unable to do so. Required max A for sequencing with pt even verbalizing "I don't need that" in reference to certain parts... Education provided. Pt remained seated in w/c, with belt alarm on/activated, and with all needs in reach at end of session.  Session 2 Skilled OT intervention completed with focus on toileting, ambulatory transfers, R visual scanning. Pt received seated in w/c, agreeable to session. No pain reported.  Pt voiced need to urinate. Retrieved urinal, needed cues x2 occurrences for avoidance of putting hand over drive line lying on arm rest when pushing up to stand. CGA sit > stand, close supervision for urinal use for continent void and pants management. Ambulated > sink with CGA without AD. Washed hands supervision.   In standing, pt placed squigz on mirror using RUE, then utilized RUE for drawing a line between two specific colored squigz. Pt close supervision for dynamic balance, however difficulty scanning to R to see squigz and some even in midline until cued. Some sequencing impairment noted as well with a couple errors. MD present to observe.  Ambulated > w/c. Pt remained seated in w/c, with belt alarm on/activated, MD present and with all needs in reach at end of session.   Therapy Documentation Precautions:  Precautions Precautions: Fall Precaution/Restrictions Comments: LVAD, R neglect, dressing over defibrillator Restrictions Weight Bearing Restrictions Per  Provider Order: No    Therapy/Group: Individual Therapy  Ruthanna Covert, MS, OTR/L  02/16/2024, 12:04 PM

## 2024-02-16 NOTE — Progress Notes (Signed)
 Physical Therapy Session Note  Patient Details  Name: Isaac [name] MRN: [id number] Date of Birth: [birth year]  Today's Date: 02/16/2024 PT Individual Time: 1400-1445 PT Individual Time Calculation (min): 45 min   Short Term Goals: Week 1:  PT Short Term Goal 1 (Week 1): pt will navigate stairs with CGA PT Short Term Goal 2 (Week 1): Pt will require min a for LVAD batteries PT Short Term Goal 3 (Week 1): Pt will ambulate >150 ft with RW  Skilled Therapeutic Interventions/Progress Updates:      Pt sitting up in wheelchair with his spouse present. Pt denies any pain. Spouse assists patient with switching LVAD from wall unit to batteries. She also assists with donning his jacket that supports his batteries. Encouraged patient to be involved with care to help direct care. Patient's visual deficits, delayed cognitive processing, and impaired memory impacting recall of sequencing of proper events.   Sit<>stand with CGA and no AD or UE support from w/c height. Slight unsteadiness with slight R trunk lean observed but no LOB. Used question cues for knowing items needed in his "emergency bag" that must be with him at all times. Patient able to recall 3/3 items (batteries, clips, and controller). Wife also aware of these needed items.   Pt ambulating from his room to outdoors (RN notified of location) and emergency bag ("lunchbox") taken with him during therapy. Pt needing visual cues for navigating hospital, especially for items on R side. CGA for ambulating throughout hospital, distances > 761ft. Also practiced unlevel gait training while outdoors, including crossing streets, navigating sloped/paved surfaces, pavers, etc. CGA for all mobiltiy while outdoors. Encouraged visual scanning to his R, safety awareness, and being mindful of environment to reduce falls risk. Patient intermittently asked questions pertaining to LVAD care, home setup, level of assist at home, and primary deficits related to his  CVA.   Pt ended session sitting in wheelchair with wife at bedside. Seat belt alarm on. RN notified patient returned to his room.   Therapy Documentation Precautions:  Precautions Precautions: Fall Precaution/Restrictions Comments: LVAD, R neglect, dressing over defibrillator Restrictions Weight Bearing Restrictions Per Provider Order: No General:     Therapy/Group: Individual Therapy  Pheobe Brass 02/16/2024, 7:46 AM

## 2024-02-16 NOTE — Progress Notes (Signed)
 Speech Language Pathology Daily Session Note  Patient Details  Name: Isaac [name] MRN: [id number] Date of Birth: 06-13-1951  Today's Date: 02/16/2024 SLP Individual Time: 1342-1405 SLP Individual Time Calculation (min): 23 min  Short Term Goals: Week 1: SLP Short Term Goal 1 (Week 1): Patient will orient to date and situation in 4/5 opportunities using external aids as needed when provided with mod assist. SLP Short Term Goal 2 (Week 1): Patient will attend to right side of functional tasks with visual components in 80% of trials given mod assist. SLP Short Term Goal 3 (Week 1): Patient will attend to functional therapy tasks for 10 minutes given overall mod assist throughout. SLP Short Term Goal 4 (Week 1): Patient will utilize basic word finding strategies during generative and responsive naming tasks to name items with 80% accuracy given mod assist. SLP Short Term Goal 5 (Week 1): Patient will solve basic functional problems with 80% accuracy given mod assist.  Skilled Therapeutic Interventions: Patient seen for extra time; SLP conducted skilled therapy session targeting cognitive-linguistic goals. Targeted scanning, sequencing, and working memory primarily, utilizing task sequencing activity. Patient benefited from reeducation re: scanning/reading strategies targeted during previous session. Patient required moderate cuing to utilize strategies and increase scanning accuracy and thoroughness. Patient was left in room with call bell in reach and alarm set. SLP will continue to target goals per plan of care.      Pain Pain Assessment Pain Scale: 0-10 Pain Score: 0-No pain  Therapy/Group: Individual Therapy  Isaac [name], M.A., CCC-SLP  Syriah Delisi A Adit Riddles 02/16/2024, 2:34 PM

## 2024-02-16 NOTE — Progress Notes (Signed)
 Recreational Therapy Session Note  Patient Details  Name: Isaac [name] MRN: [id number] Date of Birth: May 22, 1951 Today's Date: 02/16/2024  Pain: no c/o  Pt participated in animal assisted activity seated w/c level with supervision.  Pt easily engaged with pet partner team and was appreciative of this visit.  Jorge Amparo 02/16/2024, 4:40 PM

## 2024-02-16 NOTE — Progress Notes (Signed)
 Speech Language Pathology Daily Session Note  Patient Details  Name: Filippo Puls MRN: [id number] Date of Birth: Oct 29, 1950  Today's Date: 02/16/2024 SLP Individual Time: 1015-1056 SLP Individual Time Calculation (min): 41 min  Short Term Goals: Week 1: SLP Short Term Goal 1 (Week 1): Patient will orient to date and situation in 4/5 opportunities using external aids as needed when provided with mod assist. SLP Short Term Goal 2 (Week 1): Patient will attend to right side of functional tasks with visual components in 80% of trials given mod assist. SLP Short Term Goal 3 (Week 1): Patient will attend to functional therapy tasks for 10 minutes given overall mod assist throughout. SLP Short Term Goal 4 (Week 1): Patient will utilize basic word finding strategies during generative and responsive naming tasks to name items with 80% accuracy given mod assist. SLP Short Term Goal 5 (Week 1): Patient will solve basic functional problems with 80% accuracy given mod assist.  Skilled Therapeutic Interventions: SLP conducted skilled therapy session targeting cognitive-linguistic goals. Targeted scanning and working memory primarily, utilizing room schedule and advertisement tasks. Patient benefited from reeducation re: scanning/reading strategies targeted during previous session. Patient required moderate cuing to utilize strategies and increase scanning accuracy and thoroughness. Patient was left in room with call bell in reach and alarm set. SLP will continue to target goals per plan of care.    Pain Pain Assessment Pain Scale: 0-10 Pain Score: 0-No pain  Therapy/Group: Individual Therapy  Sharday Michl, M.A., CCC-SLP  Tahjae Durr A Evelette Hollern 02/16/2024, 1:01 PM

## 2024-02-16 NOTE — Progress Notes (Signed)
 Patient ID: Isaac [name], male   DOB: 08-Jun-1951, 73 y.o.   MRN: [id number]     Advanced Heart Failure Rounding Note  Cardiologist: Peder Bourdon, MD   Chief Complaint: Acute CVA  Subjective:    Transferred to CIR on 05/22.  No complaints this morning. Memory trouble noted. Difficulty with managing Life[employer]est equipment on review of PT documentation.  MAP 80s.   INR 2.6.    LVAD Interrogation HM III: Speed: 5300 Flow: 4.8 PI: 3.1 Power: 3.9. 1 PI event yesterday.    Objective:    Weight Range: 81.6 kg Body mass index is 26.57 kg/m.   Vital Signs:   Temp:  [97.7 F (36.5 C)-98.5 F (36.9 C)] 98.4 F (36.9 C) (05/28 0650) Pulse Rate:  [56-64] 56 (05/28 0512) Resp:  [17] 17 (05/28 0650) BP: (91-101)/(69-80) 96/72 (05/28 0650) SpO2:  [99 %-100 %] 99 % (05/28 0512) Weight:  [81.6 kg] 81.6 kg (05/28 0513) Last BM Date : 02/11/24  Weight change: Filed Weights   02/14/24 0415 02/15/24 0418 02/16/24 0513  Weight: 80.5 kg 81.9 kg 81.6 kg   Intake/Output:  Intake/Output Summary (Last 24 hours) at 02/16/2024 0751 Last data filed at 02/15/2024 1329 Gross per 24 hour  Intake 120 ml  Output 200 ml  Net -80 ml    Physical Exam  Physical Exam: GENERAL: no acute distress. NECK: JVP 6-7 sitting up in bed CARDIAC:  Mechanical heart sounds with LVAD hum present.  LUNGS:  Clear to auscultation bilaterally.     LVAD exit site:   Dressing dry and intact.  Stabilization device present and accurately applied.   EXTREMITIES:  Warm and dry, no edema  NEUROLOGIC:  Alert and oriented x 4. Affect pleasant.      Labs    CBC Recent Labs    02/14/24 0554 02/15/24 0455  WBC 3.9* 3.8*  HGB 11.7* 11.8*  HCT 35.9* 36.7*  MCV 90.4 90.8  PLT 238 224   Basic Metabolic Panel Recent Labs    86/57/84 0554  NA 135  K 4.3  CL 108  CO2 22  GLUCOSE 95  BUN 31*  CREATININE 2.18*  CALCIUM  8.6*   Liver Function Tests No results for input(s): "AST", "ALT", "ALKPHOS",  "BILITOT", "PROT", "ALBUMIN " in the last 72 hours.  BNP (last 3 results) Recent Labs    02/05/24 1106  BNP 343.8*   Imaging   No results found.  Medications:    Scheduled Medications:  feeding supplement  237 mL Oral BID BM   furosemide   40 mg Oral Daily   magnesium  oxide  400 mg Oral Daily   pantoprazole   40 mg Oral Daily   polyethylene glycol  17 g Oral Daily   rosuvastatin   10 mg Oral QHS   sildenafil   20 mg Oral TID   warfarin  3 mg Oral q1600   Warfarin - Pharmacist Dosing Inpatient   Does not apply q1600   Infusions:   PRN Medications: acetaminophen , ondansetron  (ZOFRAN ) IV, ondansetron , mouth rinse, mouth rinse, senna-docusate, sorbitol   Patient Profile   Vontae Court is a 73 y.o. male with a history of VT, NICM, s/p ICD, LVAD, afib, CKD IV, previously requiring HD, and CAD s/p PCI.   Assessment/Plan  1. Acute CVA: ?embolic - CT brain 5/17: + for acute to subacute L PCA and Left cerebellar infarcts. L PCA occlusion. No hemorrhagic transformation or mass effect.  - CT brain 5/18: evolution of L PCA, L cerebellar and R MCA strokes.  No hemorrhage - Echo w/o clear source of embolization - Goal INR increased 2.5-3.0. Warfarin dosing per PharmD. INR 2.6 today, per EP will try to keep INR no higher than 2.7 in first few weeks post implant.  - now in CIR; working with PT/OT and speech; strength and cognitive deficits slowly improving   2. Chronic systolic CHF:   - Long-standing cardiomyopathy. [employer] ICD s/p gen change 5/21. S/p HM-3 VAD 09/17/21.   - NYHA I at baseline - VAD interrogated personally. Stable parameters. - MAP stable.  - Volume looks okay today. Lasix  increased to 40 mg daily on 05/27. - Continue sildenafil  20 mg daily for RV dysfunction - Pressure dressing remains in place from generator change.  - EP checked ICD site 05/27, small hematoma noted   3. Tricuspid regurgitation:   - Tricuspid repair not done at time of VAD due to  proximity of ICD wires and hypotension during surgery.  TR read as normal on echo but Dr. Mitzie Anda reviewed and suspect that it remains severe.    4. Chronic Atrial fibrillation: - off amiodarone  with resolution of nausea. - ICD dependent   5. CKD stage IV:  - baseline Scr ~2.5.  - Scr stable at 2.18 on 05/26 (BMET twice a week in CIR) - Follows with nephrology.    6. CAD: History of PCI to OM1 in 2007 and RCA in 2013.   - continue statin   7. GERD with coffee ground emesis - continue PPI  8. ICD site hematoma - Small ICD site hematoma, evaluated by EP 5/27. Plan to continue to monitor.  - INR goal has been 2.5-3, for now would try to keep INR no higher than 2.7 given recent implant.   I reviewed the LVAD parameters from today, and compared the results to the patient's prior recorded data.  No programming changes were made.  The LVAD is functioning within specified parameters.  The patient performs LVAD self-test daily.  LVAD interrogation was negative for any significant power changes, alarms or PI events/speed drops.  LVAD equipment check completed and is in good working order.  Back-up equipment present.   LVAD education done on emergency procedures and precautions and reviewed exit site care.  Length of Stay: 6  Cherith Tewell N, PA-C  02/16/2024, 7:51 AM  VAD Team Pager [phone] (7am - 7am)  Advanced Heart Failure Team Pager [phone] (M-F; 7a - 5p)  Please contact [facility] for night-coverage after hours (5p -7a ) and weekends on [email or web address]

## 2024-02-16 NOTE — Progress Notes (Addendum)
 PHARMACY - ANTICOAGULATION CONSULT NOTE  Pharmacy Consult for warfarin  Indication: LVAD, new acute ischemic infarct   Allergies  Allergen Reactions   Mushroom Extract Complex (Obsolete) Nausea And Vomiting   Doxycycline  Nausea And Vomiting   Neosporin [Neomycin-Bacitracin Zn-Polymyx] Hives   Tape Other (See Comments)    Some tapes/dressings can irritate the skin    Patient Measurements: Height: 5\' 9"  (175.3 cm) Weight: 81.6 kg (179 lb 14.3 oz) IBW/kg (Calculated) : 70.7 HEPARIN  DW (KG): 81  Vital Signs: Temp: 98.4 F (36.9 C) (05/28 0650) Temp Source: Oral (05/28 0650) BP: 96/72 (05/28 0650) Pulse Rate: 56 (05/28 0512)  Labs: Recent Labs    02/14/24 0554 02/15/24 0455 02/16/24 0515  HGB 11.7* 11.8*  --   HCT 35.9* 36.7*  --   PLT 238 224  --   LABPROT 28.8* 29.5* 28.3*  INR 2.7* 2.8* 2.6*  CREATININE 2.18*  --   --     Estimated Creatinine Clearance: 31.1 mL/min (A) (by C-G formula based on SCr of 2.18 mg/dL (H)).   Medical History: Past Medical History:  Diagnosis Date   Arrhythmia    atrial fibrillation   CHF (congestive heart failure) (HCC)    Chronic kidney disease    Coronary artery disease    Hyperlipidemia    Hypertension    Myocardial infarct [facility])    Assessment: 51 yoM with HM LVAD on warfarin PTA admitted with acute CVA. Home warfarin regimen PTA 4mg  Mon/2mg  AODs with INR goal 2-2.5.  Per HF and neurology, increase INR goal to 2.5-3 for now. Pt previously on heparin  briefly this admit.  Pt s/p gen change 5/21, s/p new CVA ? Cardio-embolic.  Per EP, target INR <2.7 for a few weeks post PPM.    -INR today 2.6, LDL stable -CBC wnl/ stable.  Small pocket hematoma noted by EP 5/27 - stable per RN.  -Good appetite,improved -walking with PT  Goal of Therapy:  INR 2.5-3 (target INR <2.7 for a few weeks post implant 5/21) Monitor platelets by anticoagulation protocol: Yes   Plan:  Warfarin 3 mg daily  Daily INR  Cecillia Cogan, PharmD Clinical  Pharmacist 02/16/2024  8:36 AM

## 2024-02-16 NOTE — Patient Care Conference (Signed)
 Inpatient RehabilitationTeam Conference and Plan of Care Update Date: 02/16/2024   Time: 12:04 PM    Patient Name: Isaac [name]      Medical Record Number: [id number]  Date of Birth: March 25, 1951 Sex: Male         Room/Bed: 4M10C/4M10C-01 Payor Info: Payor: [employer] MEDICARE / Plan: [employer] MEDICARE HMO/PPO / Product Type: *No Product type* /    Admit Date/Time:  02/10/2024  6:45 PM  Primary Diagnosis:  Acute left PCA stroke [facility])  Hospital Problems: Principal Problem:   Acute left PCA stroke (HCC) Active Problems:   LVAD (left ventricular assist device) present (HCC)   Cardioembolic stroke (HCC)   Sundowning   Insomnia   Constipation   Stage 3b chronic kidney disease (HCC)   Anemia   Hypotension    Expected Discharge Date: Expected Discharge Date: 02/23/24  Team Members Present: Physician leading conference: Dr. Lylia Sand Social Worker Present: Adrianna Albee, LCSW Nurse Present: Forrestine Ike, RN;Angelina Gwenda Lema, RN PT Present: [name], PT OT Present: Tim Fontana, OT SLP Present: Dorla Gartner, SLP     Current Status/Progress Goal Weekly Team Focus  Bowel/Bladder   Pt continent of b/b   Remain continent   Assist with bathroom needs prn and qshift    Swallow/Nutrition/ Hydration               ADL's   Min/Mod A for UB dressing including LVAD vest and battery transfer, CGA/min A LB, min A toileting   Supervision   Barriers- functional/cardiovascular endurance, R visual field cut, R inattention, awareness/problem solving, dynamic standing balance    Mobility   Bed mobility supervision, transfers without AD and supervision, gait >2110ft without AD CGA/supervision.   supervision  barriers: impaired cognition, impaired motor planning/sequencing, LVAD management    Communication   mild word finding deficits and intermittent word substitutions, unaware   supervision   functionally targeting word substitutions and increasing awareness of occurrences     Safety/Cognition/ Behavioral Observations  mod for working memory and consistent use of scanning strategies, recall of daily events, sustained attention, orientation   supervision-min   working memory, scanning, use of strategies, recall of daily events, sustained attention, orientation    Pain   no c/o of pain   Remain pain free   Assess pain qshift and prn    Skin   Skin intact, L upper chest incision with steri strips   Maintain skin integrity  Assess qshift and prn      Discharge Planning:  Home with wife who is able to provide assist, pt would like to be as independent as he can be prior to discharging home. Wife here daily   Team Discussion: Patient post left PCA CVA with poor cognition, poor sequencing and carryover.    Patient on target to meet rehab goals: yes, currently needs min- mod assist for upper body for LVAD vest management and CGA for lower body care but min assist for toileting.  Needs supervision for bed mobility and CGA for transfers without use of an assitive device. Goals for discharge set for supervision overall.  *See Care Plan and progress notes for long and short-term goals.   Revisions to Treatment Plan:  N/a   Teaching Needs: Safety, medications, dietary modification, transfers, toileting, etc.   Current Barriers to Discharge: Decreased caregiver support and Home enviroment access/layout  Possible Resolutions to Barriers: Family education     Medical Summary Current Status: CVA, CHF with LVAD, constipation, hypotension, anemia  Barriers to  Discharge: Cardiac Complications;Hypotension;Self-care education;Medical stability  Barriers to Discharge Comments: CVA, CHF with LVAD, constipation, hypotension, anemia Possible Resolutions to [employer] Focus: change laxatives to PRN, monitor CBC/BMP, monitor wt   Continued Need for Acute Rehabilitation Level of Care: The patient requires daily medical management by a physician with specialized  training in physical medicine and rehabilitation for the following reasons: Direction of a multidisciplinary physical rehabilitation program to maximize functional independence : Yes Medical management of patient stability for increased activity during participation in an intensive rehabilitation regime.: Yes Analysis of laboratory values and/or radiology reports with any subsequent need for medication adjustment and/or medical intervention. : Yes   I attest that I was present, lead the team conference, and concur with the assessment and plan of the team.   Forrestine Ike B 02/16/2024, 2:54 PM

## 2024-02-17 DIAGNOSIS — I63532 Cerebral infarction due to unspecified occlusion or stenosis of left posterior cerebral artery: Secondary | ICD-10-CM | POA: Diagnosis not present

## 2024-02-17 DIAGNOSIS — T148XXA Other injury of unspecified body region, initial encounter: Secondary | ICD-10-CM

## 2024-02-17 DIAGNOSIS — Z95811 Presence of heart assist device: Secondary | ICD-10-CM | POA: Diagnosis not present

## 2024-02-17 LAB — BASIC METABOLIC PANEL WITH GFR
Anion gap: 5 (ref 5–15)
BUN: 25 mg/dL — ABNORMAL HIGH (ref 8–23)
CO2: 25 mmol/L (ref 22–32)
Calcium: 9 mg/dL (ref 8.9–10.3)
Chloride: 107 mmol/L (ref 98–111)
Creatinine, Ser: 1.97 mg/dL — ABNORMAL HIGH (ref 0.61–1.24)
GFR, Estimated: 36 mL/min — ABNORMAL LOW (ref >60)
Glucose, Bld: 88 mg/dL (ref 70–99)
Potassium: 4 mmol/L (ref 3.5–5.1)
Sodium: 137 mmol/L (ref 135–145)

## 2024-02-17 LAB — CBC WITH DIFFERENTIAL/PLATELET
Abs Immature Granulocytes: 0.01 10*3/uL (ref 0.00–0.07)
Basophils Absolute: 0 10*3/uL (ref 0.0–0.1)
Basophils Relative: 1 %
Eosinophils Absolute: 0.8 10*3/uL — ABNORMAL HIGH (ref 0.0–0.5)
Eosinophils Relative: 22 %
HCT: 37.3 % — ABNORMAL LOW (ref 39.0–52.0)
Hemoglobin: 12.3 g/dL — ABNORMAL LOW (ref 13.0–17.0)
Immature Granulocytes: 0 %
Lymphocytes Relative: 15 %
Lymphs Abs: 0.5 10*3/uL — ABNORMAL LOW (ref 0.7–4.0)
MCH: 30.1 pg (ref 26.0–34.0)
MCHC: 33 g/dL (ref 30.0–36.0)
MCV: 91.2 fL (ref 80.0–100.0)
Monocytes Absolute: 0.6 10*3/uL (ref 0.1–1.0)
Monocytes Relative: 16 %
Neutro Abs: 1.7 10*3/uL (ref 1.7–7.7)
Neutrophils Relative %: 46 %
Platelets: 253 10*3/uL (ref 150–400)
RBC: 4.09 MIL/uL — ABNORMAL LOW (ref 4.22–5.81)
RDW: 14.5 % (ref 11.5–15.5)
WBC: 3.7 10*3/uL — ABNORMAL LOW (ref 4.0–10.5)
nRBC: 0 % (ref 0.0–0.2)

## 2024-02-17 LAB — LACTATE DEHYDROGENASE: LDH: 163 U/L (ref 98–192)

## 2024-02-17 LAB — PROTIME-INR
INR: 2.2 — ABNORMAL HIGH (ref 0.8–1.2)
Prothrombin Time: 24.2 s — ABNORMAL HIGH (ref 11.4–15.2)

## 2024-02-17 MED ORDER — WARFARIN SODIUM 5 MG PO TABS
5.0000 mg | ORAL_TABLET | Freq: Once | ORAL | Status: AC
  Administered 2024-02-17: 5 mg via ORAL
  Filled 2024-02-17: qty 1

## 2024-02-17 NOTE — Plan of Care (Signed)
 Goals downgraded to reflect progress thus far and potential shortened length of stay:  Problem: RH Cognition - SLP Goal: RH LTG Patient will demonstrate orientation with cues Description:  LTG:  Patient will demonstrate orientation to person/place/time/situation with cues (SLP)   Flowsheets Taken 02/17/2024 0952 LTG: Patient will demonstrate orientation using cueing (SLP): Moderate Assistance - Patient 50 - 74% Taken 02/11/2024 1048 LTG Patient will demonstrate orientation to:  Person  Place  Time  Situation   Problem: RH Expression Communication Goal: LTG Patient will increase word finding of common (SLP) Description: LTG:  Patient will increase word finding of common objects/daily info/abstract thoughts with cues using compensatory strategies (SLP). Flowsheets (Taken 02/17/2024 [phone]) LTG: Patient will increase word finding of common (SLP): Supervision   Problem: RH Problem Solving Goal: LTG Patient will demonstrate problem solving for (SLP) Description: LTG:  Patient will demonstrate problem solving for basic/complex daily situations with cues  (SLP) Flowsheets (Taken 02/17/2024 0952) LTG: Patient will demonstrate problem solving for (SLP): Basic daily situations LTG Patient will demonstrate problem solving for: Moderate Assistance - Patient 50 - 74%   Problem: RH Memory Goal: LTG Patient will demonstrate ability for day to day (SLP) Description: LTG:   Patient will demonstrate ability for day to day recall/carryover during cognitive/linguistic activities with assist  (SLP) Flowsheets (Taken 02/17/2024 0952) LTG: Patient will demonstrate ability for day to day recall: New information LTG: Patient will demonstrate ability for day to day recall/carryover during cognitive/linguistic activities with assist (SLP): Moderate Assistance - Patient 50 - 74%   Problem: RH Attention Goal: LTG Patient will demonstrate this level of attention during functional activites (SLP) Description: LTG:   Patient will will demonstrate this level of attention during functional activites (SLP) Flowsheets Taken 02/17/2024 0952 LTG: Patient will demonstrate this level of attention during cognitive/linguistic activities with assistance of (SLP): Moderate Assistance - Patient 50 - 74% Taken 02/11/2024 1048 Number of minutes patient will demonstrate attention during cognitive/linguistic activities: 20

## 2024-02-17 NOTE — Progress Notes (Signed)
 Patient ID: Isaac [name], male   DOB: [birth year], 73 y.o.   MRN: [id number]     Advanced Heart Failure Rounding Note  Cardiologist: Peder Bourdon, MD   Chief Complaint: Acute CVA  Subjective:    Transferred to CIR on 05/22.  No complaints other than he doesn't care for the food. No dyspnea orthopnea or PND.   No significant pain at site of pocket.  INR 2.2  LVAD Interrogation HM III: Speed: 5300 Flow: 4.8 PI: 3.1 Power: 3.9. 1 PI event yesterday.  Objective:    Weight Range: 81.6 kg Body mass index is 26.57 kg/m.   Vital Signs:   Temp:  [97.5 F (36.4 C)-98.2 F (36.8 C)] 98 F (36.7 C) (05/29 0618) Pulse Rate:  [59-61] 60 (05/29 0633) Resp:  [18-19] 18 (05/29 0633) BP: (89-106)/(69-94) 96/72 (05/29 0633) SpO2:  [98 %-100 %] 98 % (05/29 0618) Last BM Date : 02/11/24  Weight change: Filed Weights   02/14/24 0415 02/15/24 0418 02/16/24 0513  Weight: 80.5 kg 81.9 kg 81.6 kg   Intake/Output:  Intake/Output Summary (Last 24 hours) at 02/17/2024 0826 Last data filed at 02/17/2024 4098 Gross per 24 hour  Intake --  Output 925 ml  Net -925 ml    Physical Exam  Physical Exam: GENERAL: no acute distress. NECK: no JVD sitting upright CARDIAC:  Mechanical heart sounds with LVAD hum present. Dried blood under steri-strips at left upper chest pocket site. Small hematoma (no change from yesterday). LUNGS:  Clear to auscultation bilaterally.  ABDOMEN:  Soft, round, nontender, positive bowel sounds x4.     LVAD exit site:   Dressing dry and intact.  Stabilization device present and accurately applied.   EXTREMITIES:  no edema  NEUROLOGIC:  Alert.  Affect pleasant.   Laughs in response to most questions.   Labs    CBC Recent Labs    02/15/24 0455 02/17/24 0653  WBC 3.8* 3.7*  NEUTROABS  --  1.7  HGB 11.8* 12.3*  HCT 36.7* 37.3*  MCV 90.8 91.2  PLT 224 253   Basic Metabolic Panel Recent Labs    11/91/47 0653  NA 137  K 4.0  CL 107  CO2 25  GLUCOSE  88  BUN 25*  CREATININE 1.97*  CALCIUM  9.0   Liver Function Tests No results for input(s): "AST", "ALT", "ALKPHOS", "BILITOT", "PROT", "ALBUMIN " in the last 72 hours.  BNP (last 3 results) Recent Labs    02/05/24 1106  BNP 343.8*   Imaging   No results found.  Medications:    Scheduled Medications:  feeding supplement  237 mL Oral BID BM   furosemide   40 mg Oral Daily   magnesium  oxide  400 mg Oral Daily   pantoprazole   40 mg Oral Daily   rosuvastatin   10 mg Oral QHS   sildenafil   20 mg Oral TID   warfarin  3 mg Oral q1600   Warfarin - Pharmacist Dosing Inpatient   Does not apply q1600   Infusions:   PRN Medications: acetaminophen , ondansetron  (ZOFRAN ) IV, ondansetron , mouth rinse, mouth rinse, polyethylene glycol, senna-docusate, sorbitol   Patient Profile   Isaac [name] is a 73 y.o. male with a history of VT, NICM, s/p ICD, LVAD, afib, CKD IV, previously requiring HD, and CAD s/p PCI.   Assessment/Plan  1. Acute CVA: ?embolic - CT brain 5/17: + for acute to subacute L PCA and Left cerebellar infarcts. L PCA occlusion. No hemorrhagic transformation or mass effect.  - CT brain  5/18: evolution of L PCA, L cerebellar and R MCA strokes. No hemorrhage - Echo w/o clear source of embolization - Goal INR increased 2.5-3.0. INR 2.2 today, per EP will try to keep INR no higher than 2.7 in first few weeks post implant. Discussed today's dosing with PharmD. - now in CIR; working with PT/OT and speech. Strength and cognition slowly improving. His wife is requesting discharge sooner than initially planned date on 06/04 (June 1st or 2nd). Plan for outpatient PT/OT.   2. Chronic systolic CHF:   - Long-standing cardiomyopathy. [employer] ICD s/p gen change 02/09/24. S/p HM-3 VAD 09/17/21.   - NYHA I at baseline - VAD interrogated personally. Stable parameters. - MAP stable.  - Volume okay today. Creatinine stable at 1.97 on labs. Lasix  increased to 40 mg daily on  05/27. - Continue sildenafil  20 mg daily for RV dysfunction - ICD pocket stable. Small hematoma present.   3. Tricuspid regurgitation:   - Tricuspid repair not done at time of VAD due to proximity of ICD wires and hypotension during surgery.  TR read as normal on echo but Dr. Mitzie Anda reviewed and suspect that it remains severe.    4. Chronic Atrial fibrillation: - off amiodarone  with resolution of nausea. - ICD dependent   5. CKD stage IV:  - baseline Scr ~2.5.  - Scr stable around 2 - Follows with nephrology.    6. CAD: History of PCI to OM1 in 2007 and RCA in 2013.   - continue statin   7. GERD with coffee ground emesis - continue PPI   I reviewed the LVAD parameters from today, and compared the results to the patient's prior recorded data.  No programming changes were made.  The LVAD is functioning within specified parameters.  The patient performs LVAD self-test daily.  LVAD interrogation was negative for any significant power changes, alarms or PI events/speed drops.  LVAD equipment check completed and is in good working order.  Back-up equipment present.   LVAD education done on emergency procedures and precautions and reviewed exit site care.  Length of Stay: 7  Countess Biebel N, PA-C  02/17/2024, 8:26 AM  VAD Team Pager [phone] (7am - 7am)  Advanced Heart Failure Team Pager [phone] (M-F; 7a - 5p)  Please contact [facility] for night-coverage after hours (5p -7a ) and weekends on [email or web address]

## 2024-02-17 NOTE — Progress Notes (Signed)
 Occupational Therapy Session Note  Patient Details  Name: Isaac [name] MRN: [id number] Date of Birth: [birth year]  Today's Date: 02/17/2024 OT Individual Time: 1044-1200 OT Individual Time Calculation (min): 76 min    Short Term Goals: Week 1:  OT Short Term Goal 1 (Week 1): Pt will complete toilet transfer with CGA using LRAD OT Short Term Goal 2 (Week 1): Pt will visually scan to R side during ADL task with min cues OT Short Term Goal 3 (Week 1): Pt will transfer LVAD from main power source to battery packs with min A OT Short Term Goal 4 (Week 1): Pt will keep LVAD battery/drive line in safe place with min verbal cues about 75% of the time  Skilled Therapeutic Interventions/Progress Updates:    Patient received seated in wheelchair still connected to main power for LVAD.  Worked with patient to disconnect from main power and connect to battery power, and load vest.  Patient's wife present to aide with making set up as close to home process as possible (current LVAD set up is slightly different than home unit.)  Patient needed mod cueing initially to scan to right to locate leads.  Patient can visually locate items to right of midline, but does not spontaneously move eyes toward right environment.  Even with movement - patient did not notice leads on right until overtly cued.  Patient able to place battery into battery pack with mod cueing initially, then once he completed successfully able to repeat.  Patient able to disconnect from wall power to battery power with supervision.  Patient able to answer simple questions relating to process.  ("Why are you disconnecting that line?, what is that alarm?"  Wife supportive and able to allow patient to trouble shoot slightly/ safely in process.  Patient currently needs mod/min cueing for process to manage LVAD.   Walked to gym to address sorting task involving mental organization and visual scanning left to right.  Patient needing moderate prompting  to successfully sort deck of cards from lowest to highest.  Patient better able to follow rote pattern to determine missing cards.   Patient with significant difficulty in copying a simple pattern of colored blocks - 6 blocks.  Patient unable to recognize errors without max cueing.   Walked back to room, Patient needing cueing and physical guidance to avoid obstacles (e.g. doorframe) on right.  Patient left up in wheelchair with safety belt in place and engaged and family at bedside. Patient declined toileting despite multiple requests if needing to use the bathroom.    Therapy Documentation Precautions:  Precautions Precautions: Fall Precaution/Restrictions Comments: LVAD, R neglect, dressing over defibrillator Restrictions Weight Bearing Restrictions Per Provider Order: No   Pain: Pain Assessment Pain Scale: 0-10 Pain Score: 0-No pain     Therapy/Group: Individual Therapy  Yasamin Karel M 02/17/2024, 12:17 PM

## 2024-02-17 NOTE — Progress Notes (Signed)
 PROGRESS NOTE   Subjective/Complaints:  Patient with no concerns or complaints this morning.  Patient's wife would like him to go home sooner potentially Monday or Sunday. LBM that was recorded was 5/26  ROS: as per HPI. Denies fever, CP, SOB, abd pain, N/V/D or any other complaints at this time.   +weakness- improved + insomnia- improved Denies feeling constipated  Objective:   No results found. Recent Labs    02/15/24 0455 02/17/24 0653  WBC 3.8* 3.7*  HGB 11.8* 12.3*  HCT 36.7* 37.3*  PLT 224 253   Recent Labs    02/17/24 0653  NA 137  K 4.0  CL 107  CO2 25  GLUCOSE 88  BUN 25*  CREATININE 1.97*  CALCIUM  9.0    Intake/Output Summary (Last 24 hours) at 02/17/2024 1320 Last data filed at 02/17/2024 1100 Gross per 24 hour  Intake 240 ml  Output 925 ml  Net -685 ml        Physical Exam: Vital Signs Blood pressure 96/72, pulse 60, temperature 98 F (36.7 C), resp. rate 18, height 5\' 9"  (1.753 m), weight 81.6 kg, SpO2 98%.  General: No apparent distress, working with therapy HEENT: Head is normocephalic, atraumatic, membranes a little dry Heart: LVAD hum present Chest: CTA bilaterally, nonlabored, L upper chest steristrips- looks ok -not particularly tender in this area Abdomen: Soft, non-tender, nondistended, + bowel sounds a little hypoactive. Extremities: No clubbing, cyanosis, or edema.  Psych: Appropriate, cooperative Skin: L upper chest bandage as above. Not tender. L abdomen LVAD drive site c/d/I.  Clean and intact without signs of breakdown.   Neuro: Patient is alert.  Mild left gaze preference.  Right inattention.  Follows simple commands. Speech less delayed, cognition appears improved Expressive aphasia -improving Moving all 4 extremities to gravity and resistance  Exam appears consistent from prior 5/29  MSK: no joint tenderness noted  Prior Exam RUE- 4/5 throughout however has R  sided neglect- hard to access LUE- 5/5 except FA 4+/5 as well as grip 5-/5 RLE- HF 2/5; KE/KF 4-/5; DF 4-/5 and PF 4/5 LLE- 5/5 throughout       Assessment/Plan: 1. Functional deficits which require 3+ hours per day of interdisciplinary therapy in a comprehensive inpatient rehab setting. Physiatrist is providing close team supervision and 24 hour management of active medical problems listed below. Physiatrist and rehab team continue to assess barriers to discharge/monitor patient progress toward functional and medical goals  Care Tool:  Bathing    Body parts bathed by patient: Right arm, Left arm, Chest, Abdomen, Front perineal area, Buttocks, Right upper leg, Left upper leg, Right lower leg, Left lower leg, Face   Body parts bathed by helper: Buttocks, Right lower leg, Left lower leg     Bathing assist Assist Level: Supervision/Verbal cueing     Upper Body Dressing/Undressing Upper body dressing   What is the patient wearing?: Pull over shirt, Orthosis Orthosis activity level: Performed by helper  Upper body assist Assist Level: Minimal Assistance - Patient > 75%    Lower Body Dressing/Undressing Lower body dressing      What is the patient wearing?: Underwear/pull up, Pants     Lower  body assist Assist for lower body dressing: Contact Guard/Touching assist     Toileting Toileting    Toileting assist Assist for toileting: Contact Guard/Touching assist     Transfers Chair/bed transfer  Transfers assist     Chair/bed transfer assist level: Contact Guard/Touching assist     Locomotion Ambulation   Ambulation assist      Assist level: Contact Guard/Touching assist Assistive device: No Device Max distance: 150   Walk 10 feet activity   Assist     Assist level: Contact Guard/Touching assist Assistive device: No Device   Walk 50 feet activity   Assist    Assist level: Contact Guard/Touching assist Assistive device: No Device    Walk  150 feet activity   Assist Walk 150 feet activity did not occur: Safety/medical concerns  Assist level: Contact Guard/Touching assist Assistive device: No Device    Walk 10 feet on uneven surface  activity   Assist Walk 10 feet on uneven surfaces activity did not occur: Safety/medical concerns         Wheelchair     Assist Is the patient using a wheelchair?: Yes Type of Wheelchair: Manual Wheelchair activity did not occur: Safety/medical concerns  Wheelchair assist level: Dependent - Patient 0% Max wheelchair distance: 150    Wheelchair 50 feet with 2 turns activity    Assist        Assist Level: Dependent - Patient 0%   Wheelchair 150 feet activity     Assist      Assist Level: Dependent - Patient 0%   Blood pressure 96/72, pulse 60, temperature 98 F (36.7 C), resp. rate 18, height 5\' 9"  (1.753 m), weight 81.6 kg, SpO2 98%.  Medical Problem List and Plan: 1. 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  Antithrombotics: -DVT/anticoagulation:  Pharmaceutical: Coumadin  per pharmacy.  Goal INR 2.5-3.0 due to recent CVA             -antiplatelet therapy: N/A 3. Pain Management: tylenol  PRN 4. Mood/Behavior/Sleep: Provide emotional support             -antipsychotic agents: N/A 5. Neuropsych/cognition: This patient is NOT capable of making decisions on his own behalf. 6. Skin/Wound Care: Routine skin checks and LVAD team following for line drive, etc -1/61/09 nursing stating pt concerned with L chest wall dressing, but pt states he's  not concerned-- LVAD team to address this bandage, it looks fine to me-- no mention of concern in notes -5/26 L chest wall dressing appears dry/intact -5/28 patient reports device fell bigger when he was speaking with therapy.  He has not appear to be tender in this area no abnormalities noted.  Discussed with his heart failure team will continue to monitor. -2/29 cardiology feels there may be a small hematoma around ICD pocket, continue to monitor not causing significant symptoms at this time 7. Fluids/Electrolytes/Nutrition: Routine in and outs with follow-up chemistries, continue vitamins/supplements.  8.  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  Continue Sildenafil  20mg  TID.  Followed by heart failure team- appreciate assistance.  Status post generator change 02/09/2024- team wants pressure dressing "for as long as possible"- due to coumadin   -Continue Lasix  40mg  every other day -02/13/24 noticed that LDH hadn't been done since 5/22, added those on daily 9.  CKD stage III.  Follow-up chemistries  -5/23 BUN and creatinine stable at 2.2/23- -5/29 BUN and creatinine improved to 25/1.97 10.  Remote tobacco use.  Provide counseling 11.  Hyperlipidemia.  Crestor  10mg  nightly 12. Severe constipation- continue senokot 2 tabs BID, miralax  daily; will need to order Sorbitol  tomorrow after therapy- since has been at least 8 days since LBM.   - 5/23 LBM today-large recorded  -02/13/24 no BM in 2 days but last one was large; monitor for now  5/27 LBM yesterday, continue current regimen, will change Senokot to as needed as he is declining  5/28 will change bowel medication to PRN per family request  - LBM recorded was 5/26-continue to monitor 13. Patient wants a haircut- usually wears hair "bald"- and shaves at home and on coumadin - please evaluate if possible.  14.  Insomnia.  Wife declines any medication changes tonight.  Would consider  tomorrow 15.  Sundowning/delirium.  Avoid medications that worsen cognition.   -improved, he is sleeping better 16. GI ppx: continue protonix  40mg  daily  17. Hypotension: -5/26-28 BP soft but stable, HF team following  - 5/29 BP a little soft today but overall has been stable, continue to monitor for now Vitals:   02/14/24 2141 02/14/24 2151 02/15/24 0411 02/15/24 1318  BP: 94/70 90/62 98/77  95/69   02/15/24 2002 02/16/24 0512 02/16/24 0650 02/16/24 1219  BP: 91/80 101/74 96/72 (!) 106/94   02/16/24 1250 02/16/24 2003 02/17/24 0618 02/17/24 0633  BP: 103/82 95/69 (!) 89/71 96/72    18. Anemia: -02/13/24 Hgb down to 11.6 from 12-13 range the last week-- but was 11.6 on 5/18 also; would monitor very closely, if continued drop, then would occult test stool. CBC ordered daily as of now so will be checked tomorrow -5/29 Hgb stable at 12.3, cardiology increasing INR goal to 2.5-3  LOS: 7 days A FACE TO FACE EVALUATION WAS PERFORMED  Lylia Sand 02/17/2024, 1:20 PM

## 2024-02-17 NOTE — Progress Notes (Signed)
 LVAD Coordinator Rounding Note:  Isaac [name] admitted 02/05/24 for altered mental status. Code Stroke activated. CT showed Left PCA and left cerebellar infarct. Isaac [name] not candidate for thrombolytic therapy or IR due to the occlusion being too distal.  STAT CT head 1. 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 Hyperdense Left PCA suggests occlusion of that vessel. No hemorrhagic transformation or mass effect. 2. Underlying advanced chronic small vessel disease. 3. Right supraorbital/forehead recent soft tissue injury with no skull fracture identified.  Isaac [name] sitting up in the chair. Isaac [name] seems to be in good spirits today. Wife wants the Isaac [name] to go home Sunday or Monday opposed to the suggested time of 6/4.  Left upper chest / PPM site with steri-strips in place. Small amt of old/dried blood under strips. Small pocket hematoma, soft.    Goal INR now 2.5-3.0 per Neurology.  Vital signs: Temp: 98 HR: 60 V paced Doppler Pressure: not documented Auto Cuff BP: 89/71 (78) O2 Sat: 98% on RA Wt: 184>183.2>182.9>187.3>178.3>177.9>176.2>180.5>179.9 lbs    LVAD interrogation reveals:  Speed: 5300 Flow: 4.7 Power: 3.9 w PI: 2.9 Hematocrit: 37  Alarms: none Events: none  Fixed speed: 5300 Low speed limit: 5000  Drive Line:  Existing VAD dressing C/D/I with anchor intact and accurately applied. Weekly dressing changes per VAD coordinator or pts wife. Next dressing change due: 02/19/24 by caregiver.  Labs:  LDH trend: 161>175>154>169>158>159>177>163  INR trend: 2.2>2.6>2.3>2.7>2.5>2.4>2.1>2.8>2.6>2.2  Anticoagulation Plan: -INR Goal: 2.5-3.0  Device: - Cabin crew - V paced 60 -Therapies: OFF  Infection:   Plan/Recommendations:  Page VAD coordinator for drive line or equipment concerns 2. Weekly dressing changes per VAD Coordinator or caregiver.  Adams Adams RN,BSN VAD Coordinator  Office: [phone]  24/7 Pager: [phone]

## 2024-02-17 NOTE — Progress Notes (Signed)
 Speech Language Pathology Daily Session Note  Patient Details  Name: Isaac [name] MRN: [id number] Date of Birth: 10-20-1950  Today's Date: 02/17/2024 SLP Individual Time: 0932-1026 SLP Individual Time Calculation (min): 54 min  Short Term Goals: Week 1: SLP Short Term Goal 1 (Week 1): Patient will orient to date and situation in 4/5 opportunities using external aids as needed when provided with mod assist. SLP Short Term Goal 2 (Week 1): Patient will attend to right side of functional tasks with visual components in 80% of trials given mod assist. SLP Short Term Goal 3 (Week 1): Patient will attend to functional therapy tasks for 10 minutes given overall mod assist throughout. SLP Short Term Goal 4 (Week 1): Patient will utilize basic word finding strategies during generative and responsive naming tasks to name items with 80% accuracy given mod assist. SLP Short Term Goal 5 (Week 1): Patient will solve basic functional problems with 80% accuracy given mod assist.  Skilled Therapeutic Interventions: SLP conducted skilled therapy session targeting cognitive goals. SLP conducted map scanning task challenging thoroughness, right inattention, working memory, and processing. Patient benefited from task breakdown and max assist to achieve 80% accuracy. SLP then engaged in discussion re: patient and family's desire to discharge from hospital sooner than original recommended time. Wife endorses that patient may do better in a familiar environment. SLP provided education re: severity of cognitive deficits and need for 24/7 supervision and mod to max assist for all cognitive tasks. Recommended family be wary of patient partaking in high-stakes iADLs such as organizing medications or finances without full hands-on assistance. Family verbalized understanding. In remaining minutes of session, targeted card game task challenging working memory, recall of game rules, and sustained attention. Patient made  appropriate selections with supervision-min assist and benefited from mod to max assist for working memory. Patient was left in room with call bell in reach and alarm set. SLP will continue to target goals per plan of care.        Pain Pain Assessment Pain Scale: 0-10 Pain Score: 0-No pain  Therapy/Group: Individual Therapy  Marinda Tyer, M.A., CCC-SLP  Dorenda Pfannenstiel A Rane Dumm 02/17/2024, 10:49 AM

## 2024-02-17 NOTE — Progress Notes (Signed)
 PHARMACY - ANTICOAGULATION CONSULT NOTE  Pharmacy Consult for warfarin  Indication: LVAD, new acute ischemic infarct   Allergies  Allergen Reactions   Mushroom Extract Complex (Obsolete) Nausea And Vomiting   Doxycycline  Nausea And Vomiting   Neosporin [Neomycin-Bacitracin Zn-Polymyx] Hives   Tape Other (See Comments)    Some tapes/dressings can irritate the skin    Patient Measurements: Height: 5\' 9"  (175.3 cm) Weight: 81.6 kg (179 lb 14.3 oz) IBW/kg (Calculated) : 70.7 HEPARIN  DW (KG): 81  Vital Signs: Temp: 98 F (36.7 C) (05/29 0618) BP: 96/72 (05/29 8657) Pulse Rate: 60 (05/29 0633)  Labs: Recent Labs    02/15/24 0455 02/16/24 0515 02/17/24 0653  HGB 11.8*  --  12.3*  HCT 36.7*  --  37.3*  PLT 224  --  253  LABPROT 29.5* 28.3* 24.2*  INR 2.8* 2.6* 2.2*  CREATININE  --   --  1.97*    Estimated Creatinine Clearance: 34.4 mL/min (A) (by C-G formula based on SCr of 1.97 mg/dL (H)).   Medical History: Past Medical History:  Diagnosis Date   Arrhythmia    atrial fibrillation   CHF (congestive heart failure) (HCC)    Chronic kidney disease    Coronary artery disease    Hyperlipidemia    Hypertension    Myocardial infarct [facility])    Assessment: 24 yoM with HM LVAD on warfarin PTA admitted with acute CVA. Home warfarin regimen PTA 4mg  Mon/2mg  AODs with INR goal 2-2.5.  Per HF and neurology, increase INR goal to 2.5-3. Pt previously on heparin  briefly this admit.  Pt s/p gen change 5/21, s/p new CVA ? Cardio-embolic.  Per EP, target INR <2.7 for a few weeks post PPM.    -INR today 2.2 (subtherapeutic), LDH stable -CBC wnl/ stable (qMon, Thurs).  Small pocket hematoma noted by EP 5/27 - stable on exam.  -Good appetite  Goal of Therapy:  INR 2.5-3 (target INR <2.7 for a few weeks post implant 5/21) Monitor platelets by anticoagulation protocol: Yes   Plan:  Warfarin 5 mg PO x 1 Daily INR  Cecillia Cogan, PharmD Clinical Pharmacist 02/17/2024  8:30 AM

## 2024-02-17 NOTE — Progress Notes (Signed)
 Physical Therapy Session Note  Patient Details  Name: Isaac [name] MRN: [id number] Date of Birth: 1951-03-02  Today's Date: 02/17/2024 PT Individual Time: 1415-1530 PT Individual Time Calculation (min): 75 min   Short Term Goals: Week 1:  PT Short Term Goal 1 (Week 1): pt will navigate stairs with CGA PT Short Term Goal 2 (Week 1): Pt will require min a for LVAD batteries PT Short Term Goal 3 (Week 1): Pt will ambulate >150 ft with RW  Skilled Therapeutic Interventions/Progress Updates: Pt presents supine in bed and agreeable to therapy.  Pt already hooked to battery power for LVAD.  Pt transfers sup to sit w/ supervision and sat EOB to don shoes w/ set-up.  Pt transfers throughout session from various surfaces indoors and outdoors w/ supervision.  Pt amb into BR and voided in toilet.  Pt amb on/off elevators w/o AD and supervision, w/ reminders for attention to right.  Pt amb outdoor surfaces w/o AD carrying "suitcase" of supplies including down ramped surface, up stairs, crossing crosswalk.  Pt requires cues for attention to right including tapping on R arm for turning head.  Pt had 1 LOB tripping over curb on right, requiring min A to regain.  Pt negotiated through car barriers w/ cues to avoid hitting R side/arm.  Pt returned to inside w/ changing terrain.  Pt returned to bed and sat EOB and given connectors.  Pt doffed vest and sat up on L side as done previously.  Pt removed batteries and unfastened connectors and connected to wall power/monitor, w/ cues for increasing field of view.  Pt transferred sit to supine w/ mod I and remained supine w/ spouse present.     Therapy Documentation Precautions:  Precautions Precautions: Fall Precaution/Restrictions Comments: LVAD, R neglect, dressing over defibrillator Restrictions Weight Bearing Restrictions Per Provider Order: No General:   Vital Signs:   Pain:0/10      Therapy/Group: Individual Therapy  Odessia Benedict 02/17/2024, 3:49 PM

## 2024-02-17 NOTE — Progress Notes (Addendum)
 Patient ID: Isaac [name], male   DOB: [birth year], 73 y.o.   MRN: [id number] Met with pt and wife to discuss team conference goals of supervision/CGA level and discharge ate 6/4. Wife wanted to know who decided this since he has different therapists daily and an OT did not place the LVAD right so how is he suppose to learn when done wrong to begin with. She would like for him to go home Sunday or Monday feeling he would do better and is asking to go home. Discussed OP vs HH and she wants OP feels he will do better and [facility] is closer to them. Will make referral to them for PT OT SP. She also wanted a copy of the team conference note. Will message team on earlier dc date  1:41 pm Have faxed OP referral to [facility] for follow up rehab

## 2024-02-18 ENCOUNTER — Telehealth (HOSPITAL_COMMUNITY): Payer: Self-pay

## 2024-02-18 ENCOUNTER — Other Ambulatory Visit (HOSPITAL_COMMUNITY): Payer: Self-pay

## 2024-02-18 DIAGNOSIS — I4891 Unspecified atrial fibrillation: Secondary | ICD-10-CM

## 2024-02-18 MED ORDER — POLYETHYLENE GLYCOL 3350 17 G PO PACK
17.0000 g | PACK | Freq: Every day | ORAL | Status: DC | PRN
Start: 2024-02-18 — End: 2024-03-21

## 2024-02-18 MED ORDER — POTASSIUM CHLORIDE CRYS ER 20 MEQ PO TBCR
20.0000 meq | EXTENDED_RELEASE_TABLET | Freq: Every day | ORAL | 0 refills | Status: DC
  Filled 2024-02-18: qty 30, 30d supply, fill #0

## 2024-02-18 MED ORDER — WARFARIN SODIUM 5 MG PO TABS
5.0000 mg | ORAL_TABLET | Freq: Every day | ORAL | 0 refills | Status: DC
  Filled 2024-02-18: qty 30, 30d supply, fill #0

## 2024-02-18 MED ORDER — SILDENAFIL CITRATE 20 MG PO TABS
20.0000 mg | ORAL_TABLET | Freq: Three times a day (TID) | ORAL | 0 refills | Status: DC
Start: 2024-02-18 — End: 2024-07-11
  Filled 2024-02-18: qty 90, 30d supply, fill #0

## 2024-02-18 MED ORDER — WARFARIN SODIUM 5 MG PO TABS: 5.0000 mg | ORAL_TABLET | Freq: Every day | ORAL | Status: DC

## 2024-02-18 MED ORDER — ROSUVASTATIN CALCIUM 10 MG PO TABS
10.0000 mg | ORAL_TABLET | Freq: Every day | ORAL | 0 refills | Status: AC
  Filled 2024-02-18: qty 30, 30d supply, fill #0

## 2024-02-18 MED ORDER — FUROSEMIDE 20 MG PO TABS
40.0000 mg | ORAL_TABLET | Freq: Every day | ORAL | 0 refills | Status: DC
  Filled 2024-02-18: qty 60, 30d supply, fill #0

## 2024-02-18 MED ORDER — WARFARIN SODIUM 2 MG PO TABS
ORAL_TABLET | ORAL | 11 refills | Status: DC
  Filled 2024-02-18: qty 40, 28d supply, fill #0

## 2024-02-18 MED ORDER — PANTOPRAZOLE SODIUM 40 MG PO TBEC
40.0000 mg | DELAYED_RELEASE_TABLET | Freq: Every day | ORAL | 0 refills | Status: DC
Start: 2024-02-18 — End: 2024-04-02
  Filled 2024-02-18: qty 30, 30d supply, fill #0

## 2024-02-18 MED ORDER — MAGNESIUM OXIDE -MG SUPPLEMENT 400 (240 MG) MG PO TABS
400.0000 mg | ORAL_TABLET | Freq: Every day | ORAL | 0 refills | Status: DC
  Filled 2024-02-18: qty 30, 30d supply, fill #0

## 2024-02-18 MED ORDER — ACETAMINOPHEN 325 MG PO TABS: 650.0000 mg | ORAL_TABLET | ORAL | Status: AC | PRN

## 2024-02-18 NOTE — Plan of Care (Signed)
  Problem: RH Grooming Goal: LTG Patient will perform grooming w/assist,cues/equip (OT) Description: LTG: Patient will perform grooming with assist, with/without cues using equipment (OT) Outcome: Not Met (add Reason) Flowsheets (Taken 02/18/2024 1405) LTG: Pt will perform grooming with assistance level of: (Not met due to supervision needed for R attention and safety) --   Problem: RH Dressing Goal: LTG Patient will perform upper body dressing (OT) Description: LTG Patient will perform upper body dressing with assist, with/without cues (OT). Outcome: Not Met (add Reason) Flowsheets (Taken 02/18/2024 1405) LTG: Pt will perform upper body dressing with assistance level of: (Not met due to supervision needed for R attention and safety with LVAD vest) --   Problem: RH Awareness Goal: LTG: Patient will demonstrate awareness during functional activites type of (OT) Description: LTG: Patient will demonstrate awareness during functional activites type of (OT) Outcome: Not Met (add Reason) Flowsheets (Taken 02/18/2024 1405) LTG: Patient will demonstrate awareness during functional activites type of (OT): (Not met due to supervision needed for R attention and safety) --   Problem: RH Balance Goal: LTG Patient will maintain dynamic standing with ADLs (OT) Description: LTG:  Patient will maintain dynamic standing balance with assist during activities of daily living (OT)  Outcome: Completed/Met   Problem: Sit to Stand Goal: LTG:  Patient will perform sit to stand in prep for activites of daily living with assistance level (OT) Description: LTG:  Patient will perform sit to stand in prep for activites of daily living with assistance level (OT) Outcome: Completed/Met   Problem: RH Eating Goal: LTG Patient will perform eating w/assist, cues/equip (OT) Description: LTG: Patient will perform eating with assist, with/without cues using equipment (OT) Outcome: Completed/Met   Problem: RH  Bathing Goal: LTG Patient will bathe all body parts with assist levels (OT) Description: LTG: Patient will bathe all body parts with assist levels (OT) Outcome: Completed/Met   Problem: RH Dressing Goal: LTG Patient will perform lower body dressing w/assist (OT) Description: LTG: Patient will perform lower body dressing with assist, with/without cues in positioning using equipment (OT) Outcome: Completed/Met   Problem: RH Toileting Goal: LTG Patient will perform toileting task (3/3 steps) with assistance level (OT) Description: LTG: Patient will perform toileting task (3/3 steps) with assistance level (OT)  Outcome: Completed/Met   Problem: RH Toilet Transfers Goal: LTG Patient will perform toilet transfers w/assist (OT) Description: LTG: Patient will perform toilet transfers with assist, with/without cues using equipment (OT) Outcome: Completed/Met

## 2024-02-18 NOTE — Progress Notes (Signed)
 Physical Therapy Discharge Summary  Patient Details  Name: Isaac [name] MRN: [id number] Date of Birth: 06-10-1951  Date of Discharge from PT service:Feb 18, 2024  Patient has met 7 of 7 long term goals due to improved activity tolerance, improved balance, improved postural control, increased strength, improved attention, improved awareness, and improved coordination.  Patient to discharge at an ambulatory level Supervision.   Patient's care partner is independent to provide the necessary physical and cognitive assistance at discharge.  Reasons goals not met: n/a  Recommendation:  Patient will benefit from ongoing skilled PT services in outpatient setting to continue to advance safe functional mobility, address ongoing impairments in vision, balance, and endurance, and minimize fall risk.  Equipment: No equipment provided  Reasons for discharge: treatment goals met and discharge from hospital  Patient/family agrees with progress made and goals achieved: Yes  PT Discharge Precautions/Restrictions Precautions Precautions: Fall Precaution/Restrictions Comments: LVAD Restrictions Weight Bearing Restrictions Per Provider Order: No Pain Pain Assessment Pain Scale: 0-10 Pain Score: 0-No pain Pain Interference Pain Interference Pain Effect on Sleep: 0. Does not apply - I have not had any pain or hurting in the past 5 days Pain Interference with Therapy Activities: 0. Does not apply - I have not received rehabilitationtherapy in the past 5 days Pain Interference with Day-to-Day Activities: 1. Rarely or not at all Vision/Perception  Vision - History Ability to See in Adequate Light: 1 Impaired Vision - Assessment Eye Alignment: Within Functional Limits Ocular Range of Motion: Within Functional Limits Alignment/Gaze Preference: Within Defined Limits Tracking/Visual Pursuits: Decreased smoothness of horizontal tracking;Decreased smoothness of vertical tracking Saccades:  Decreased speed of saccadic movement Convergence: Impaired - to be further tested in functional context Perception Perception: Impaired Preception Impairment Details: Inattention/Neglect;Spatial orientation Perception-Other Comments: R side Praxis Praxis: Impaired Praxis Impairment Details: Initiation;Organization;Motor planning  Cognition Overall Cognitive Status: Impaired/Different from baseline Arousal/Alertness: Awake/alert Attention: Focused;Sustained;Selective Focused Attention: Appears intact Sustained Attention: Appears intact Selective Attention: Impaired Selective Attention Impairment: Verbal basic;Functional basic Memory: Impaired Memory Impairment: Storage deficit;Retrieval deficit;Decreased recall of new information Awareness: Impaired Awareness Impairment: Emergent impairment;Anticipatory impairment Problem Solving: Impaired Problem Solving Impairment: Functional basic;Verbal basic Executive Function: Sequencing;Self Monitoring;Self Correcting Sequencing: Impaired Sequencing Impairment: Verbal basic;Functional basic Self Monitoring: Impaired Self Monitoring Impairment: Verbal basic;Functional basic Self Correcting: Impaired Self Correcting Impairment: Verbal basic;Functional basic Behaviors: Poor frustration tolerance Safety/Judgment: Impaired Sensation Sensation Light Touch: Impaired by gross assessment Hot/Cold: Appears Intact Proprioception: Impaired by gross assessment Stereognosis: Not tested Additional Comments: Proprioception impaired LLE Coordination Gross Motor Movements are Fluid and Coordinated: No Fine Motor Movements are Fluid and Coordinated: No Motor  Motor Motor: Other (comment) Motor - Skilled Clinical Observations: R inattention and debility  Mobility Bed Mobility Bed Mobility: Sit to Supine;Supine to Sit Supine to Sit: Independent Sit to Supine: Independent Transfers Transfers: Sit to Stand;Stand Pivot Transfers;Stand to Sit Sit to  Stand: Independent Stand to Sit: Independent Stand Pivot Transfers: Independent Transfer (Assistive device): None Locomotion  Gait Ambulation: Yes Gait Assistance: Supervision/Verbal cueing Gait Distance (Feet): 150 Feet Assistive device: None Gait Assistance Details: Verbal cues for precautions/safety;Verbal cues for gait pattern Gait Gait: Yes Gait Pattern: Within Functional Limits Stairs / Additional Locomotion Stairs: Yes Stairs Assistance: Contact Guard/Touching assist Stair Management Technique: Two rails;Forwards;Alternating pattern Number of Stairs: 12 Height of Stairs: 6 Wheelchair Mobility Wheelchair Mobility: No  Trunk/Postural Assessment  Cervical Assessment Cervical Assessment: Within Functional Limits Thoracic Assessment Thoracic Assessment: Within Functional Limits Lumbar Assessment Lumbar Assessment: Within Functional Limits Postural Control Postural  Control: Within Functional Limits  Balance Balance Balance Assessed: Yes Static Sitting Balance Static Sitting - Balance Support: Feet supported;No upper extremity supported Static Sitting - Level of Assistance: 7: Independent Dynamic Sitting Balance Dynamic Sitting - Balance Support: Feet supported;No upper extremity supported Dynamic Sitting - Level of Assistance: 7: Independent Static Standing Balance Static Standing - Balance Support: During functional activity;No upper extremity supported Static Standing - Level of Assistance: 7: Independent Dynamic Standing Balance Dynamic Standing - Balance Support: During functional activity;No upper extremity supported Dynamic Standing - Level of Assistance: 5: Stand by assistance Extremity Assessment      RLE Assessment RLE Assessment: Within Functional Limits LLE Assessment LLE Assessment: Within Functional Limits   Isaac [name] Isaac [name] 02/18/2024, 12:28 PM

## 2024-02-18 NOTE — Progress Notes (Signed)
 Inpatient Rehabilitation Care Coordinator Discharge Note   Patient Details  Name: Isaac [name] MRN: [id number] Date of Birth: 21-Nov-1950   Discharge location: HOME WITH WIFE WHO CAN PROVIDE 24/7 SUPERVISION LEVEL  Length of Stay: 8 DAYS  Discharge activity level: CGA-SUPERVISION LEVEL  Home/community participation: ACTIVE  Patient response WU:JWJXBJ Literacy - How often do you need to have someone help you when you read instructions, pamphlets, or other written material from your doctor or pharmacy?: Never  Patient response YN:WGNFAO Isolation - How often do you feel lonely or isolated from those around you?: Never  Services provided included: MD, RD, PT, OT, SLP, RN, CM, Pharmacy, SW  Financial Services:  Financial Services Utilized: Private Insurance [employer] MEDICARE  Choices offered to/list presented to: WIFE  Follow-up services arranged:  Outpatient    Outpatient Servicies: [facility]  PT  OT  SP  WILL CALL TO SE TUP FOLLOW UP APPOINTMENTS    NO DME NEEDS  Patient response to transportation need: Is the patient able to respond to transportation needs?: Yes In the past 12 months, has lack of transportation kept you from medical appointments or from getting medications?: No In the past 12 months, has lack of transportation kept you from meetings, work, or from getting things needed for daily living?: No   Patient/Family verbalized understanding of follow-up arrangements:  Yes  Individual responsible for coordination of the follow-up plan: [name]-WIFE [phone]  Confirmed correct DME delivered: Isaac [name] 02/18/2024    Comments (or additional information):BOTH FEEL WOULD DO BETTER AT HOME. WIFE HAS BEEN STAYING HERE AND PARTICIPATED IN THERAPIES.   Summary of Stay    Date/Time Discharge Planning CSW  02/16/24 [phone] Home with wife who is able to provide assist, pt would like to be as independent as he can be prior to discharging home. Wife  here daily RGD       Jaylin Benzel G

## 2024-02-18 NOTE — Progress Notes (Signed)
 Physical Therapy Session Note  Patient Details  Name: Isaac [name] MRN: [id number] Date of Birth: [birth year]  Today's Date: 02/18/2024 PT Missed Time: 60 Minutes Missed Time Reason: Other (Comment) (pt being discharged)  Patient and family packing up the room to leave for DC - MD aware of plan. No questions or concerns from family re: DC plan. Pt missed 60 minutes of therapy.    Therapy/Group: Individual Therapy  Pheobe Brass 02/18/2024, 7:50 AM

## 2024-02-18 NOTE — Progress Notes (Signed)
 Speech Language Pathology Discharge Summary  Patient Details  Name: Isaac [name] MRN: [id number] Date of Birth: 02-29-1951  Date of Discharge from SLP service:Feb 18, 2024  Patient has met 4 of 5 long term goals.  Patient to discharge at overall Mod level.  Reasons goals not met: n/a   Clinical Impression/Discharge Summary:  Pt met 4/5 LTGs this stay and requires modA overall for cognitive-linguistic skills. Memory/orientation deficits remain the pt's biggest barrier to progress and overall safety. However, pt/family education complete, and family is available to provide 24/7 supervision. Given ongoing deficits and short length of stay, he would benefit from continued ST upon d/c to target remaining deficits, facilitate return to prev responsibilities, maximize pt independence, and reduce caregiver burden.    Care Partner:  Caregiver Able to Provide Assistance: Yes  Type of Caregiver Assistance: Cognitive;Physical  Recommendation:  Outpatient SLP;24 hour supervision/assistance;Home Health SLP  Rationale for SLP Follow Up: Maximize cognitive function and independence;Maximize functional communication;Reduce caregiver burden   Equipment: n/a   Reasons for discharge: Discharged from hospital   Patient/Family Agrees with Progress Made and Goals Achieved: Yes    Rozell Cornet 02/18/2024, 12:54 PM

## 2024-02-18 NOTE — Progress Notes (Signed)
 Occupational Therapy Session Note  Patient Details  Name: Isaac [name] MRN: [id number] Date of Birth: [birth year]  Missed 45 min due to early discharge  Skilled Therapeutic Interventions/Progress Updates:    Patient scheduled for 45 min OT session but patient and family have opted to discharge early/ this am.  Saw patient in the hallway as they were leaving and completed BIMS.  Patient with nonsensical responses - unsure if eager to leave and joking, or language impaired greater than on admission.  Wife and husband thankful for care while in the hospital.    Therapy Documentation Precautions:  Precautions Precautions: Fall Precaution/Restrictions Comments: LVAD Restrictions Weight Bearing Restrictions Per Provider Order: No General: General OT Amount of Missed Time: 45 Minutes PT Missed Treatment Reason: Other (Comment) (pt being discharged)   Pain: Pain Assessment Pain Score: 0-No pain       Zayne Draheim M 02/18/2024, 1:02 PM

## 2024-02-18 NOTE — Progress Notes (Signed)
 Occupational Therapy Discharge Summary  Patient Details  Name: Isaac [name] MRN: [id number] Date of Birth: 27-Mar-1951  Date of Discharge from OT service:Feb 18, 2024   Patient has met 7 of 10 long term goals due to improved activity tolerance, improved balance, ability to compensate for deficits, and improved coordination.  Patient to discharge at overall Supervision level.  Patient's care partner is independent to provide the necessary physical and cognitive assistance at discharge. Formal education not provided due to abrupt DC per wife's request however wife present for several sessions and aware of pt's deficits and able to manage pt's LVAD transfer from main power source <> battery packs independently and/or guiding pt with mod cueing needed.  Reasons goals not met: Pt did not meet UB dressing goal at set up A level due to supervision needed for cueing for accuracy and sequencing with donning LVAD vest. Grooming goal not met due to R visual field cut and cues needed to attend to R side. Awareness goal not met due to continued cognitive deficits requiring min/mod A.  Recommendation:  Patient will benefit from ongoing skilled OT services in outpatient setting to continue to advance functional skills in the area of BADL, iADL, and Reduce care partner burden.  Equipment: No equipment provided  Reasons for discharge: treatment goals met and discharge from hospital  Patient/family agrees with progress made and goals achieved: Yes  OT Discharge Precautions/Restrictions  Precautions Precautions: Fall Precaution/Restrictions Comments: LVAD Restrictions Weight Bearing Restrictions Per Provider Order: No ADL ADL Eating: Supervision/safety Where Assessed-Eating: Wheelchair Grooming: Supervision/safety Where Assessed-Grooming: Sitting at sink Upper Body Bathing: Supervision/safety Where Assessed-Upper Body Bathing: Edge of bed Lower Body Bathing: Supervision/safety Where  Assessed-Lower Body Bathing: Standing at sink, Sitting at sink Upper Body Dressing: Supervision/safety Where Assessed-Upper Body Dressing: Edge of bed Lower Body Dressing: Supervision/safety Where Assessed-Lower Body Dressing: Sitting at sink, Standing at sink Toileting: Supervision/safety Where Assessed-Toileting: Teacher, adult education: Close supervision Toilet Transfer Method: Proofreader: Raised toilet seat Tub/Shower Transfer: Not assessed Tub/Shower Transfer Method: Unable to assess Film/video editor: Not assessed Film/video editor Method: Unable to assess ADL Comments: LVAD not appropriate for shower Vision Baseline Vision/History: 1 Wears glasses Vision Assessment?: Vision impaired- to be further tested in functional context Eye Alignment: Within Functional Limits Ocular Range of Motion: Within Functional Limits Alignment/Gaze Preference: Within Defined Limits Tracking/Visual Pursuits: Decreased smoothness of horizontal tracking;Decreased smoothness of vertical tracking Saccades: Decreased speed of saccadic movement Convergence: Impaired - to be further tested in functional context Visual Fields: Right visual field deficit Perception  Perception: Impaired Perception-Other Comments: R side Praxis Praxis: Impaired Praxis Impairment Details: Initiation;Organization;Motor planning Cognition Cognition Overall Cognitive Status: Impaired/Different from baseline Arousal/Alertness: Awake/alert Memory: Impaired Memory Impairment: Decreased short term memory Decreased Short Term Memory: Verbal basic;Functional basic Attention: Focused;Sustained Focused Attention: Appears intact Sustained Attention: Impaired Sustained Attention Impairment: Verbal basic;Verbal complex;Functional basic Selective Attention: Impaired Selective Attention Impairment: Verbal basic;Functional basic Awareness: Impaired Awareness Impairment: Intellectual  impairment Problem Solving: Impaired Problem Solving Impairment: Functional basic;Verbal basic Executive Function: Sequencing;Self Monitoring;Self Correcting Sequencing: Impaired Sequencing Impairment: Verbal basic;Functional basic Self Monitoring: Impaired Self Monitoring Impairment: Verbal basic;Functional basic Self Correcting: Impaired Self Correcting Impairment: Verbal basic;Functional basic Behaviors: Poor frustration tolerance Safety/Judgment: Impaired Brief Interview for Mental Status (BIMS) Repetition of Three Words (First Attempt): None Temporal Orientation: Year: Missed by more than 5 years Temporal Orientation: Month: Nonsensical Temporal Orientation: Day: Nonsensical Recall: "Sock": No, could not recall Recall: "Blue": No, could not recall Recall: "Bed":  No, could not recall BIMS Summary Score: 0 Sensation Sensation Light Touch: Impaired by gross assessment Hot/Cold: Appears Intact Proprioception: Impaired by gross assessment Stereognosis: Not tested Additional Comments: Proprioception impaired LLE Coordination Gross Motor Movements are Fluid and Coordinated: No Fine Motor Movements are Fluid and Coordinated: No Motor  Motor Motor: Other (comment) Motor - Skilled Clinical Observations: R inattention and debility Mobility  Bed Mobility Bed Mobility: Sit to Supine;Supine to Sit Supine to Sit: Independent Sit to Supine: Independent Transfers Sit to Stand: Independent Stand to Sit: Independent  Trunk/Postural Assessment  Cervical Assessment Cervical Assessment: Within Functional Limits Thoracic Assessment Thoracic Assessment: Within Functional Limits Lumbar Assessment Lumbar Assessment: Within Functional Limits Postural Control Postural Control: Within Functional Limits  Balance Balance Balance Assessed: Yes Static Sitting Balance Static Sitting - Balance Support: Feet supported;No upper extremity supported Static Sitting - Level of Assistance: 7:  Independent Dynamic Sitting Balance Dynamic Sitting - Balance Support: Feet supported;No upper extremity supported Dynamic Sitting - Level of Assistance: 7: Independent Static Standing Balance Static Standing - Balance Support: During functional activity;No upper extremity supported Static Standing - Level of Assistance: 7: Independent Dynamic Standing Balance Dynamic Standing - Balance Support: During functional activity;No upper extremity supported Dynamic Standing - Level of Assistance: 5: Stand by assistance Extremity/Trunk Assessment RUE Assessment RUE Assessment: Within Functional Limits LUE Assessment LUE Assessment: Exceptions to [facility] Active Range of Motion (AROM) Comments: Limited SF 100 degrees, WFL distally. Limited by discomfort in LUQ from pacemaker site General Strength Comments: 3-/5   Ruthanna Covert, MS, OTR/L  02/18/2024, 2:05 PM

## 2024-02-18 NOTE — Progress Notes (Signed)
 Patient ID: Isaac [name], male   DOB: 1950-12-20, 73 y.o.   MRN: [id number] wife and pt want to go home today and plan on it. Has spoken with MD and therapist. Set up for OP at [facility]. No equipment needs

## 2024-02-18 NOTE — Progress Notes (Signed)
 PROGRESS NOTE   Subjective/Complaints:  Patient and wife adamant that he will go home today.  Discussed that I think there would be some benefit in staying for a few more days however they do not agree.  Wife and patient indicated he is frustrated from still being in the hospital.  I discussed with member heart failure team when they were in his room, and they seem to be agreeable for this.   ROS: as per HPI. Denies fever, CP, SOB, abd pain, N/V/D or any other complaints at this time.   +weakness- improved + insomnia- improved Denies constipation and wife reports he had a recent bowel movement  Objective:   No results found. Recent Labs    02/17/24 0653  WBC 3.7*  HGB 12.3*  HCT 37.3*  PLT 253   Recent Labs    02/17/24 0653  NA 137  K 4.0  CL 107  CO2 25  GLUCOSE 88  BUN 25*  CREATININE 1.97*  CALCIUM  9.0    Intake/Output Summary (Last 24 hours) at 02/18/2024 1725 Last data filed at 02/18/2024 0800 Gross per 24 hour  Intake 240 ml  Output 650 ml  Net -410 ml        Physical Exam: Vital Signs Blood pressure (!) 84/46, pulse (!) 59, temperature 98.2 F (36.8 C), temperature source Oral, resp. rate 16, height 5\' 9"  (1.753 m), weight 88.1 kg, SpO2 99%.  General: No apparent distress, laying in bed, wife at the bedside HEENT: Head is normocephalic, atraumatic, membranes a little dry Heart: LVAD hum present Chest: CTA bilaterally, nonlabored, L upper chest steristrips- looks ok -not particularly tender in this area Abdomen: Soft, non-tender, nondistended, + bowel sounds a little hypoactive. Extremities: No clubbing, cyanosis, or edema.  Psych: Appropriate, cooperative Skin: L upper chest bandage as above. Not tender. L abdomen LVAD drive site c/d/I.  Clean and intact without signs of breakdown.   Neuro: Patient is alert.  Mild left gaze preference.  Right inattention.  Follows simple commands. Speech less  delayed, cognition appears improved Expressive aphasia -improving Moving all 4 extremities to gravity and resistance  Exam appears consistent from prior 5/30  MSK: no joint tenderness noted  Prior Exam RUE- 4/5 throughout however has R sided neglect- hard to access LUE- 5/5 except FA 4+/5 as well as grip 5-/5 RLE- HF 2/5; KE/KF 4-/5; DF 4-/5 and PF 4/5 LLE- 5/5 throughout       Assessment/Plan: 1. Functional deficits which require 3+ hours per day of interdisciplinary therapy in a comprehensive inpatient rehab setting. Physiatrist is providing close team supervision and 24 hour management of active medical problems listed below. Physiatrist and rehab team continue to assess barriers to discharge/monitor patient progress toward functional and medical goals  Care Tool:  Bathing    Body parts bathed by patient: Right arm, Left arm, Chest, Abdomen, Front perineal area, Buttocks, Right upper leg, Left upper leg, Right lower leg, Left lower leg, Face   Body parts bathed by helper: Buttocks, Right lower leg, Left lower leg     Bathing assist Assist Level: Supervision/Verbal cueing     Upper Body Dressing/Undressing Upper body dressing   What is  the patient wearing?: Pull over shirt, Orthosis (LVAD vest) Orthosis activity level: Performed by patient  Upper body assist Assist Level: Supervision/Verbal cueing    Lower Body Dressing/Undressing Lower body dressing      What is the patient wearing?: Underwear/pull up, Pants     Lower body assist Assist for lower body dressing: Supervision/Verbal cueing     Toileting Toileting    Toileting assist Assist for toileting: Supervision/Verbal cueing     Transfers Chair/bed transfer  Transfers assist     Chair/bed transfer assist level: Independent     Locomotion Ambulation   Ambulation assist      Assist level: Supervision/Verbal cueing Assistive device: No Device Max distance: 300   Walk 10 feet  activity   Assist     Assist level: Supervision/Verbal cueing Assistive device: No Device   Walk 50 feet activity   Assist    Assist level: Supervision/Verbal cueing Assistive device: No Device    Walk 150 feet activity   Assist Walk 150 feet activity did not occur: Safety/medical concerns  Assist level: Supervision/Verbal cueing Assistive device: No Device    Walk 10 feet on uneven surface  activity   Assist Walk 10 feet on uneven surfaces activity did not occur: Safety/medical concerns   Assist level: Supervision/Verbal cueing Assistive device: Other (comment) (no device.)   Wheelchair     Assist Is the patient using a wheelchair?: No Type of Wheelchair: Manual Wheelchair activity did not occur: Safety/medical concerns  Wheelchair assist level: Dependent - Patient 0% Max wheelchair distance: 150    Wheelchair 50 feet with 2 turns activity    Assist        Assist Level: Dependent - Patient 0%   Wheelchair 150 feet activity     Assist      Assist Level: Dependent - Patient 0%   Blood pressure (!) 84/46, pulse (!) 59, temperature 98.2 F (36.8 C), temperature source Oral, resp. rate 16, height 5\' 9"  (1.753 m), weight 88.1 kg, SpO2 99%.  Medical Problem List and Plan: 1. 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  Antithrombotics: -DVT/anticoagulation:  Pharmaceutical: Coumadin  per pharmacy.  Goal INR 2.5-3.0 due to recent CVA             -antiplatelet therapy: N/A 3. Pain Management: tylenol   PRN 4. Mood/Behavior/Sleep: Provide emotional support             -antipsychotic agents: N/A 5. Neuropsych/cognition: This patient is NOT capable of making decisions on his own behalf. 6. Skin/Wound Care: Routine skin checks and LVAD team following for line drive, etc -5/62/13 nursing stating pt concerned with L chest wall dressing, but pt states he's not concerned-- LVAD team to address this bandage, it looks fine to me-- no mention of concern in notes -5/26 L chest wall dressing appears dry/intact -5/28 patient reports device fell bigger when he was speaking with therapy.  He has not appear to be tender in this area no abnormalities noted.  Discussed with his heart failure team will continue to monitor. -5/29 cardiology feels there may be a small hematoma around ICD pocket, continue to monitor not causing significant symptoms at this time -5/30 no change in small hematoma noted today 7. Fluids/Electrolytes/Nutrition: Routine in and outs with follow-up chemistries, continue vitamins/supplements.  8.  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  Continue Sildenafil  20mg  TID.  Followed by heart failure team- appreciate assistance.  Status post generator change 02/09/2024- team wants pressure dressing "for as long as possible"- due to coumadin   -Continue Lasix  40mg  every other day -02/13/24 noticed that LDH hadn't been done since 5/22, added those on daily -Discussed importance of continuing to follow INRs for adjustments, wife reports he has home device and she will follow this with cardiology 9.  CKD stage III.  Follow-up chemistries  -5/23 BUN and creatinine stable at 2.2/23- -5/29 BUN and creatinine improved to 25/1.97 10.  Remote tobacco use.  Provide counseling 11.  Hyperlipidemia.  Crestor  10mg  nightly 12. Severe constipation- continue senokot 2 tabs BID, miralax  daily; will need to order Sorbitol  tomorrow after therapy- since has been  at least 8 days since LBM.   - 5/23 LBM today-large recorded  -02/13/24 no BM in 2 days but last one was large; monitor for now  5/27 LBM yesterday, continue current regimen, will change Senokot to as needed as he is declining  5/28 will change bowel medication to PRN per family request  - LBM recorded was 5/26-continue to monitor  - Wife reports he is having regular bowel movements 13. Patient wants a haircut- usually wears hair "bald"- and shaves at home and on coumadin - please evaluate if possible.  14.  Insomnia.  Wife declines any medication changes tonight.  Would consider tomorrow 15.  Sundowning/delirium.  Avoid medications that worsen cognition.   -improved, he is sleeping better 16. GI ppx: continue protonix  40mg  daily  17. Hypotension: -5/26-28 BP soft but stable, HF team following  - 5/29-30 BP a little soft today but overall has been stable, continue to monitor for now, follow-up with cardiology and they are monitoring closely Vitals:   02/15/24 2002 02/16/24 0512 02/16/24 0650 02/16/24 1219  BP: 91/80 101/74 96/72 (!) 106/94   02/16/24 1250 02/16/24 2003 02/17/24 0618 02/17/24 0633  BP: 103/82 95/69 (!) 89/71 96/72   02/17/24 1635 02/17/24 1640 02/17/24 2200 02/18/24 0837  BP: 96/81 96/81 (!) 85/45 (!) 84/46    18. Anemia: -02/13/24 Hgb down to 11.6 from 12-13 range the last week-- but was 11.6 on 5/18 also; would monitor very closely, if continued drop, then would occult test stool. CBC ordered daily as of now so will be checked tomorrow -5/29 Hgb stable at 12.3, cardiology increasing INR goal to 2.5-3  - Continue monitoring with PCP and cardiology  LOS: 8 days A FACE TO FACE EVALUATION WAS PERFORMED  Lylia Sand 02/18/2024, 5:25 PM

## 2024-02-18 NOTE — Progress Notes (Signed)
 LVAD Coordinator Rounding Note:  Pt admitted 02/05/24 for altered mental status. Code Stroke activated. CT showed Left PCA and left cerebellar infarct. Pt not candidate for thrombolytic therapy or IR due to the occlusion being too distal.  STAT CT head 1. 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 Hyperdense Left PCA suggests occlusion of that vessel. No hemorrhagic transformation or mass effect. 2. Underlying advanced chronic small vessel disease. 3. Right supraorbital/forehead recent soft tissue injury with no skull fracture identified.  Pt sitting on the edge of the bed packed and ready to be discharged. Apparently pt and wife stated he was ready for discharged and no longer wanted to stay in the hospital today. Hospital f/u made for 6/9.   Left upper chest / PPM site with steri-strips in place. Small amt of old/dried blood under strips. Small pocket hematoma, soft.    Goal INR now 2.5-3.0 per Neurology. Goal will be 2.5-2.7 for the next couple weeks due to the pacer site.   Vital signs: Temp: 98.2 HR: 60 V paced Doppler Pressure: not documented Auto Cuff BP: 84/46 (58) O2 Sat: 99% on RA Wt: 184>183.2>182.9>187.3>178.3>177.9>176.2>180.5>179.9 lbs    LVAD interrogation reveals:  Speed: 5300 Flow: 4.7 Power: 3.9 w PI: 2.9 Hematocrit: 37  Alarms: none Events: none  Fixed speed: 5300 Low speed limit: 5000  Drive Line:  Existing VAD dressing C/D/I with anchor intact and accurately applied. Weekly dressing changes per VAD coordinator or pts wife. Next dressing change due: 02/19/24 by caregiver.  Labs:  LDH trend: 161>175>154>169>158>159>177>163  INR trend: 2.2>2.6>2.3>2.7>2.5>2.4>2.1>2.8>2.6>2.2  Anticoagulation Plan: -INR Goal: 2.5-3.0  Device: - Cabin crew - V paced 60 -Therapies: OFF  Infection:   Plan/Recommendations:  Page VAD coordinator for drive line or equipment concerns 2. Weekly dressing changes per VAD  Coordinator or caregiver. 3. Pt d/cing home. Hosp f/u 6/9 @ 1100. Pt to check home INR on Monday 6/2.  Adams Adams RN,BSN VAD Coordinator  Office: [phone]  24/7 Pager: [phone]

## 2024-02-18 NOTE — Telephone Encounter (Signed)
 Pharmacy Patient Advocate Encounter   Received notification from Inpatient Request that prior authorization for Sildenafil  Citrate (PAH) 20MG  tablets is required/requested.   Insurance verification completed.   The patient is insured through [employer] .   Per test claim: PA required and submitted KEY/EOC/Request #: [id number] from 09/22/2023 to 09/20/2024

## 2024-02-18 NOTE — Progress Notes (Signed)
 PHARMACY - ANTICOAGULATION CONSULT NOTE  Pharmacy Consult for warfarin  Indication: LVAD, new acute ischemic infarct   Allergies  Allergen Reactions   Mushroom Extract Complex (Obsolete) Nausea And Vomiting   Doxycycline  Nausea And Vomiting   Neosporin [Neomycin-Bacitracin Zn-Polymyx] Hives   Tape Other (See Comments)    Some tapes/dressings can irritate the skin    Patient Measurements: Height: 5\' 9"  (175.3 cm) Weight: 88.1 kg (194 lb 3.6 oz) IBW/kg (Calculated) : 70.7 HEPARIN  DW (KG): 81  Vital Signs: BP: 84/46 (05/30 0837)  Labs: Recent Labs    02/16/24 0515 02/17/24 0653  HGB  --  12.3*  HCT  --  37.3*  PLT  --  253  LABPROT 28.3* 24.2*  INR 2.6* 2.2*  CREATININE  --  1.97*    Estimated Creatinine Clearance: 37.8 mL/min (A) (by C-G formula based on SCr of 1.97 mg/dL (H)).   Medical History: Past Medical History:  Diagnosis Date   Arrhythmia    atrial fibrillation   CHF (congestive heart failure) (HCC)    Chronic kidney disease    Coronary artery disease    Hyperlipidemia    Hypertension    Myocardial infarct [facility])    Assessment: 9 yoM with HM LVAD on warfarin PTA admitted with acute CVA. Home warfarin regimen PTA 4mg  Mon/2mg  AODs with INR goal 2-2.5.  Per HF and neurology, increase INR goal to 2.5-3. Pt previously on heparin  briefly this admit.  Pt s/p gen change 5/21, s/p new CVA ? Cardio-embolic.  Per EP, target INR <2.7 for a few weeks post PPM.    5/30 - no INR collected today.  Discharging early.  Plan to send home on 4 mg MWF and 2 mg all other days - HF/LVAD pharmacist aware.  Patient has home INR machine and will check INR on Monday.  Goal of Therapy:  INR 2.5-3 (target INR <2.7 for a few weeks post implant 5/21) Monitor platelets by anticoagulation protocol: Yes   Plan:  Discharge home on warfarin 4 mg MWF, 2 mg all other days INR check Monday  Cecillia Cogan, PharmD Clinical Pharmacist 02/18/2024  10:12 AM

## 2024-02-18 NOTE — Progress Notes (Signed)
 SLP Cancellation Note  Patient Details Name: Isaac [name] MRN: [id number] DOB: 05-13-1951   Cancelled treatment:   Attempted to see pt for scheduled tx. He and his wife were adamant that pt would be discharging today, despite scheduled d/c 6/2. Notified treatment team of pt concern. See d/c summary for more info   Rozell Cornet 02/18/2024, 9:37 PM

## 2024-02-18 NOTE — Progress Notes (Signed)
 Occupational Therapy Session Note  Patient Details  Name: Isaac [name] MRN: [id number] Date of Birth: October 02, 1950  Today's Date: 02/18/2024 OT Missed Time: 45 Minutes Missed Time Reason: Unavailable (comment) (unplanned discharge)   Pt discharged prior to scheduled OT session. Unable to make up missed time.   Kenley Rettinger E Anays [place], MS, OTR/L  02/18/2024, 2:03 PM

## 2024-02-18 NOTE — Telephone Encounter (Signed)
 Pharmacy Patient Advocate Encounter   Received notification from Inpatient Request that prior authorization for Sildenafil  Citrate (PAH) 20MG  tablets is required/requested.   Insurance verification completed.   The patient is insured through [facility] .   Per test claim: PA required; PA submitted to above mentioned insurance via CoverMyMeds Key/confirmation #/EOC [id number] Status is pending

## 2024-02-18 NOTE — Progress Notes (Signed)
 Inpatient Rehabilitation Discharge Medication Review by a Pharmacist  A complete drug regimen review was completed for this patient to identify any potential clinically significant medication issues.  High Risk Drug Classes Is patient taking? Indication by Medication  Antipsychotic No   Anticoagulant Yes Warfarin - LVAD, CVA  Antibiotic No   Opioid No   Antiplatelet No   Hypoglycemics/insulin  No   Vasoactive Medication Yes Furosemide  - CHF Sildenafil  - PAH, HTN  Chemotherapy No   Other Yes Magnesium , Potassium - supplement Pantoprazole  - Reflux Rosuvastatin  - HLD     Type of Medication Issue Identified Description of Issue Recommendation(s)  Drug Interaction(s) (clinically significant)     Duplicate Therapy     Allergy     No Medication Administration End Date     Incorrect Dose     Additional Drug Therapy Needed     Significant med changes from prior encounter (inform family/care partners about these prior to discharge).    Other       Clinically significant medication issues were identified that warrant physician communication and completion of prescribed/recommended actions by midnight of the next day:  No  Name of provider notified for urgent issues identified:   Provider Method of Notification:     Pharmacist comments:   Time spent performing this drug regimen review (minutes):  20 minutes  Thank you. Lennice Quivers, PharmD

## 2024-02-18 NOTE — Plan of Care (Signed)
  Problem: RH Memory Goal: LTG Patient will demonstrate ability for day to day (SLP) Description: LTG:   Patient will demonstrate ability for day to day recall/carryover during cognitive/linguistic activities with assist  (SLP) Outcome: Not Met (add Reason) d/t severity of deficits   Problem: RH Cognition - SLP Goal: RH LTG Patient will demonstrate orientation with cues Description:  LTG:  Patient will demonstrate orientation to person/place/time/situation with cues (SLP)   Outcome: Completed/Met   Problem: RH Expression Communication Goal: LTG Patient will increase word finding of common (SLP) Description: LTG:  Patient will increase word finding of common objects/daily info/abstract thoughts with cues using compensatory strategies (SLP). Outcome: Completed/Met   Problem: RH Problem Solving Goal: LTG Patient will demonstrate problem solving for (SLP) Description: LTG:  Patient will demonstrate problem solving for basic/complex daily situations with cues  (SLP) Outcome: Completed/Met   Problem: RH Attention Goal: LTG Patient will demonstrate this level of attention during functional activites (SLP) Description: LTG:  Patient will will demonstrate this level of attention during functional activites (SLP) Outcome: Completed/Met

## 2024-02-18 NOTE — Progress Notes (Signed)
 Patient ID: Isaac [name], male   DOB: 1950/11/23, 73 y.o.   MRN: [id number]     Advanced Heart Failure Rounding Note  Cardiologist: Peder Bourdon, MD   Chief Complaint: Acute CVA  Subjective:    Transferred to CIR on 05/22.  Patient and wife adamant that they want to go home today. Otherwise feels fine.   INR 2.2  LVAD Interrogation HM III: Speed: 5300 Flow: 4.6 PI: 3 Power: 3.9. unable to fully interrogate device as he is on his control   Objective:    Weight Range: 88.1 kg Body mass index is 28.68 kg/m.   Vital Signs:   Temp:  [97.6 F (36.4 C)-98.2 F (36.8 C)] 98.2 F (36.8 C) (05/29 1948) Pulse Rate:  [59] 59 (05/29 1948) Resp:  [16] 16 (05/30 0837) BP: (84-96)/(45-81) 84/46 (05/30 0837) SpO2:  [99 %-100 %] 99 % (05/29 1948) Weight:  [87.7 kg-88.1 kg] 88.1 kg (05/30 0500) Last BM Date : 02/11/24  Weight change: Filed Weights   02/16/24 0513 02/17/24 1400 02/18/24 0500  Weight: 81.6 kg 87.7 kg 88.1 kg   Intake/Output:  Intake/Output Summary (Last 24 hours) at 02/18/2024 1009 Last data filed at 02/18/2024 0800 Gross per 24 hour  Intake 960 ml  Output 650 ml  Net 310 ml    Physical Exam  General:  Well appearing. No resp difficulty Neck: supple. JVP ~7. L [place] hematoma stable, steri-strips in place Cor: Mechanical heart sounds with LVAD hum present. Lungs: Clear Driveline: C/D/I; securement device intact and driveline incorporated Extremities: no cyanosis, clubbing, rash, edema Neuro: alert & oriented x3. Moves all 4 extremities w/o difficulty. Affect pleasant   Labs    CBC Recent Labs    02/17/24 0653  WBC 3.7*  NEUTROABS 1.7  HGB 12.3*  HCT 37.3*  MCV 91.2  PLT 253   Basic Metabolic Panel Recent Labs    08/65/78 0653  NA 137  K 4.0  CL 107  CO2 25  GLUCOSE 88  BUN 25*  CREATININE 1.97*  CALCIUM  9.0   Liver Function Tests No results for input(s): "AST", "ALT", "ALKPHOS", "BILITOT", "PROT", "ALBUMIN " in the last 72  hours.  BNP (last 3 results) Recent Labs    02/05/24 1106  BNP 343.8*   Imaging   No results found.  Medications:    Scheduled Medications:  feeding supplement  237 mL Oral BID BM   furosemide   40 mg Oral Daily   magnesium  oxide  400 mg Oral Daily   pantoprazole   40 mg Oral Daily   rosuvastatin   10 mg Oral QHS   sildenafil   20 mg Oral TID   warfarin  5 mg Oral q1600   Warfarin - Pharmacist Dosing Inpatient   Does not apply q1600   Infusions:   PRN Medications: acetaminophen , ondansetron  (ZOFRAN ) IV, ondansetron , mouth rinse, mouth rinse, polyethylene glycol, senna-docusate, sorbitol   Patient Profile   Isaac [name] is a 73 y.o. male with a history of VT, NICM, s/p ICD, LVAD, afib, CKD IV, previously requiring HD, and CAD s/p PCI.   Assessment/Plan  1. Acute CVA: ?embolic - CT brain 5/17: + for acute to subacute L PCA and Left cerebellar infarcts. L PCA occlusion. No hemorrhagic transformation or mass effect.  - CT brain 5/18: evolution of L PCA, L cerebellar and R MCA strokes. No hemorrhage - Echo w/o clear source of embolization - Goal INR increased 2.5-3.0. INR 2.2 today, per EP will try to keep INR no higher than 2.7  in first few weeks post implant. Discussed today's dosing with PharmD. - now in CIR; working with PT/OT and speech. Plan for early discharge today per patient and wife's request. Plan for outpatient PT/OT.   2. Chronic systolic CHF:   - Long-standing cardiomyopathy. [employer] ICD s/p gen change 02/09/24. S/p HM-3 VAD 09/17/21.   - NYHA I at baseline - VAD interrogated personally. Stable parameters. - MAP stable. 80s - Volume okay today. Creatinine stable at 1.97 on labs. Lasix  increased to 40 mg daily on 05/27. Discussed indications for PRN use on top of daily. Asked him to weight daily.  - Continue sildenafil  20 mg daily for RV dysfunction - ICD pocket stable. Small hematoma present, stable.   3. Tricuspid regurgitation:   - Tricuspid  repair not done at time of VAD due to proximity of ICD wires and hypotension during surgery.  TR read as normal on echo but Dr. Mitzie Anda reviewed and suspect that it remains severe.    4. Chronic Atrial fibrillation: - off amiodarone  with resolution of nausea. - ICD dependent   5. CKD stage IV:  - baseline Scr ~2.5.  - Scr stable around 2 - Follows with nephrology.    6. CAD: History of PCI to OM1 in 2007 and RCA in 2013.   - continue statin   7. GERD with coffee ground emesis - continue PPI  Stable for discharge today from AHF standpoint. Will arrange f/u in LVAD clinic. Patient checks INR at home and it is managed by AHF team. Discussed with Dr. Alease Amend.   AHF meds at discharge:  Lasix  40 mg daily + PRN for weight 3lb weight gain overnight or 5lb in 1 week. Rosuvastatin  10 mg at bedtime Sildenafil  20 mg TID Warfarin 4 mg MWF, 2 mg T,Thu, Sat, Sun.    I reviewed the LVAD parameters from today, and compared the results to the patient's prior recorded data.  No programming changes were made.  The LVAD is functioning within specified parameters.  The patient performs LVAD self-test daily.  LVAD interrogation was negative for any significant power changes, alarms or PI events/speed drops.  LVAD equipment check completed and is in good working order.  Back-up equipment present.   LVAD education done on emergency procedures and precautions and reviewed exit site care.  Length of Stay: 8  Sheryl Donna, NP  02/18/2024, 10:09 AM  VAD Team Pager [phone] (7am - 7am)  Advanced Heart Failure Team Pager [phone] (M-F; 7a - 5p)  Please contact [facility] for night-coverage after hours (5p -7a ) and weekends on [email or web address]

## 2024-02-22 ENCOUNTER — Ambulatory Visit (HOSPITAL_COMMUNITY): Payer: Self-pay | Admitting: Pharmacist

## 2024-02-23 ENCOUNTER — Other Ambulatory Visit: Payer: Self-pay

## 2024-02-23 ENCOUNTER — Ambulatory Visit: Attending: Cardiology

## 2024-02-23 ENCOUNTER — Ambulatory Visit: Attending: Physician Assistant | Admitting: Physical Therapy

## 2024-02-23 ENCOUNTER — Encounter: Payer: Self-pay | Admitting: Physical Therapy

## 2024-02-23 ENCOUNTER — Ambulatory Visit

## 2024-02-23 ENCOUNTER — Ambulatory Visit: Admitting: Speech Pathology

## 2024-02-23 ENCOUNTER — Ambulatory Visit: Admitting: Occupational Therapy

## 2024-02-23 DIAGNOSIS — R4701 Aphasia: Secondary | ICD-10-CM

## 2024-02-23 DIAGNOSIS — M6281 Muscle weakness (generalized): Secondary | ICD-10-CM | POA: Diagnosis present

## 2024-02-23 DIAGNOSIS — R41841 Cognitive communication deficit: Secondary | ICD-10-CM

## 2024-02-23 DIAGNOSIS — I639 Cerebral infarction, unspecified: Secondary | ICD-10-CM | POA: Insufficient documentation

## 2024-02-23 DIAGNOSIS — R269 Unspecified abnormalities of gait and mobility: Secondary | ICD-10-CM | POA: Insufficient documentation

## 2024-02-23 DIAGNOSIS — Z95811 Presence of heart assist device: Secondary | ICD-10-CM | POA: Diagnosis present

## 2024-02-23 DIAGNOSIS — R278 Other lack of coordination: Secondary | ICD-10-CM | POA: Insufficient documentation

## 2024-02-23 DIAGNOSIS — R2689 Other abnormalities of gait and mobility: Secondary | ICD-10-CM | POA: Insufficient documentation

## 2024-02-23 DIAGNOSIS — I5022 Chronic systolic (congestive) heart failure: Secondary | ICD-10-CM

## 2024-02-23 LAB — CUP PACEART INCLINIC DEVICE CHECK
Date Time Interrogation Session: 20250604160220
HighPow Impedance: 51 Ohm
Implantable Lead Connection Status: 753985
Implantable Lead Connection Status: 753985
Implantable Lead Implant Date: 20140205
Implantable Lead Implant Date: 20140205
Implantable Lead Location: 753859
Implantable Lead Location: 753860
Implantable Lead Model: 296
Implantable Lead Model: 4470
Implantable Lead Serial Number: 121707
Implantable Lead Serial Number: 727876
Implantable Pulse Generator Implant Date: 20250521
Lead Channel Impedance Value: 374 Ohm
Lead Channel Impedance Value: 472 Ohm
Lead Channel Pacing Threshold Amplitude: 0.8 V
Lead Channel Pacing Threshold Amplitude: 0.9 V
Lead Channel Pacing Threshold Amplitude: 1.2 V
Lead Channel Pacing Threshold Amplitude: 1.4 V
Lead Channel Pacing Threshold Pulse Width: 0.4 ms
Lead Channel Pacing Threshold Pulse Width: 0.4 ms
Lead Channel Pacing Threshold Pulse Width: 0.4 ms
Lead Channel Pacing Threshold Pulse Width: 0.4 ms
Lead Channel Sensing Intrinsic Amplitude: 0.6 mV
Lead Channel Sensing Intrinsic Amplitude: 7.6 mV
Lead Channel Setting Pacing Amplitude: 2 V
Lead Channel Setting Pacing Amplitude: 3 V
Lead Channel Setting Pacing Pulse Width: 0.4 ms
Lead Channel Setting Sensing Sensitivity: 0.5 mV
Pulse Gen Serial Number: 693113
Zone Setting Status: 755011

## 2024-02-23 NOTE — Patient Instructions (Addendum)

## 2024-02-23 NOTE — Therapy (Addendum)
 OUTPATIENT OCCUPATIONAL THERAPY NEURO EVALUATION  Patient Name: Hildreth Robart MRN: [id number] DOB:1950-12-24, 73 y.o., male Today's Date: 02/23/2024  PCP: Lyle San, MD REFERRING PROVIDER: Sterling Eisenmenger, PA-C  END OF SESSION:  OT End of Session - 02/23/24 1430     Visit Number 1    Number of Visits 1    Date for OT Re-Evaluation 02/23/24    OT Start Time 1145    OT Stop Time 1250    OT Time Calculation (min) 65 min    Activity Tolerance Patient tolerated treatment well    Behavior During Therapy [facility] for tasks assessed/performed             Past Medical History:  Diagnosis Date   Arrhythmia    atrial fibrillation   CHF (congestive heart failure) (HCC)    Chronic kidney disease    Coronary artery disease    Hyperlipidemia    Hypertension    Myocardial infarct [facility])    Past Surgical History:  Procedure Laterality Date   CARDIAC DEFIBRILLATOR PLACEMENT  feb 2014   ICD GENERATOR CHANGEOUT N/A 02/09/2024   Procedure: ICD GENERATOR CHANGEOUT;  Surgeon: Tammie Fall, MD;  Location: [facility] INVASIVE CV LAB;  Service: Cardiovascular;  Laterality: N/A;   INSERTION OF IMPLANTABLE LEFT VENTRICULAR ASSIST DEVICE N/A 09/17/2021   Procedure: INSERTION OF IMPLANTABLE LEFT VENTRICULAR ASSIST DEVICE AND INSERTION OF FEMORAL ARTERIAL LINE;  Surgeon: Bartley Lightning, MD;  Location: [facility] OR;  Service: Open Heart Surgery;  Laterality: N/A;   IR FLUORO GUIDE CV LINE RIGHT  09/27/2021   IR THORACENTESIS ASP PLEURAL SPACE W/IMG GUIDE  09/27/2021   IR US  GUIDE VASC ACCESS RIGHT  09/27/2021   PLACEMENT OF IMPELLA LEFT VENTRICULAR ASSIST DEVICE N/A 09/12/2021   Procedure: PLACEMENT OF IMPELLA 5.5 LEFT VENTRICULAR ASSIST DEVICE;  Surgeon: Zelphia Higashi, MD;  Location: [facility] OR;  Service: Open Heart Surgery;  Laterality: N/A;   RIGHT HEART CATH N/A 09/08/2021   Procedure: RIGHT HEART CATH;  Surgeon: Darlis Eisenmenger, MD;  Location: [facility] INVASIVE CV LAB;  Service: Cardiovascular;   Laterality: N/A;   TEE WITHOUT CARDIOVERSION N/A 09/12/2021   Procedure: TRANSESOPHAGEAL ECHOCARDIOGRAM (TEE);  Surgeon: Zelphia Higashi, MD;  Location: [facility] OR;  Service: Open Heart Surgery;  Laterality: N/A;   TEE WITHOUT CARDIOVERSION N/A 09/17/2021   Procedure: TRANSESOPHAGEAL ECHOCARDIOGRAM (TEE);  Surgeon: Bartley Lightning, MD;  Location: [facility] OR;  Service: Open Heart Surgery;  Laterality: N/A;   Patient Active Problem List   Diagnosis Date Noted   Hematoma 02/17/2024   Anemia 02/15/2024   Hypotension 02/15/2024   Sundowning 02/11/2024   Insomnia 02/11/2024   Constipation 02/11/2024   Stage 3b chronic kidney disease (HCC) 02/11/2024   Cardioembolic stroke (HCC) 02/10/2024   Acute left PCA stroke (HCC) 02/05/2024   Abnormal thyroid  blood test 04/05/2023   Abscess of left groin 11/27/2022   VT (ventricular tachycardia) (HCC) 10/27/2022   ICD (implantable cardioverter-defibrillator) in place 10/27/2022   Pressure injury of skin 10/07/2021   LVAD (left ventricular assist device) present (HCC)    Protein-calorie malnutrition, severe 09/10/2021   Elevated TSH    PICC (peripherally inserted central catheter) in place    Cardiogenic shock (HCC)    Syncope 09/07/2021   CHF (congestive heart failure) (HCC) 09/06/2021   CAD (coronary artery disease) 08/22/2021   Cardiomyopathy (HCC) 08/22/2021   Mixed hyperlipidemia 08/22/2021   A-fib (HCC) 08/22/2021   Elevated troponin 08/22/2021   Acute on chronic  combined systolic and diastolic CHF (congestive heart failure) (HCC) 08/22/2021   Benign essential hypertension 02/22/2015   Chronic systolic heart failure (HCC) 07/05/2014    ONSET DATE: 02/05/2024  REFERRING DIAG: L PCA CVA  THERAPY DIAG:  Muscle weakness (generalized)  Other lack of coordination  Rationale for Evaluation and Treatment: Rehabilitation  SUBJECTIVE:   SUBJECTIVE STATEMENT: Pt. Wife expressed that there have been visual changes since the stroke, and that  she has worked with him on compensatory strategies/scanning to more attention to the R side.  Pt accompanied by: self and significant other  PERTINENT HISTORY: Pt. Is a 73 yo male  who was admitted to the hospital on 02/05/2024  with a Left PCA CVA. Pt. Underwent and ICD Generator change out procedure while in the hospital. Pt. Was admitted to inpatient rehab from 02/10/2024 to 02/18/24. PMHx includes: LVAD placement in 2022, Defibrillator placement in 2014, cardiomyopathy, CAD, HTN, Hyperlipidemia, Chronic Systolic CHF, Ventricular tachycardia.  PRECAUTIONS: Not operating big equipment, not straining himself  WEIGHT BEARING RESTRICTIONS: No  PAIN:  Are you having pain? No  FALLS: Has patient fallen in last 6 months? No  LIVING ENVIRONMENT: Lives with: lives with their family and lives with their spouse Lives in: House/apartment Stairs: None Has following equipment at home: none reported  PLOF: Independent and Independent with basic ADLs  PATIENT GOALS: Eval only  OBJECTIVE:  Note: Objective measures were completed at Evaluation unless otherwise noted.  HAND DOMINANCE: Right  ADLs: Overall ADLs: independent Transfers/ambulation related to ADLs: independent Eating: Feeds himself, not really cutting, uses forks to cut food, wife sets up food, has a hard time opening jars and small lids. Grooming: Independent with brushing teeth,  UB Dressing: independent, picks out own clothes LB Dressing: independent, picks out own clothes Toileting: independent for toilet hygiene for standing and sitting Bathing: independent Tub Shower transfers: independent Equipment: none  IADLs: Shopping: Has not been out recently  Light housekeeping: not fully, but he is able to go through the motions, taking out the trash without prompting Meal Prep: wife typically does meal prep. Community mobility: independent Medication management: Wife sets up meds Financial management: Wife typically deals with  financial management Handwriting: 75% legible for signature only  MOBILITY STATUS: Independent  POSTURE COMMENTS:  No Significant postural limitations Sitting balance: Good  ACTIVITY TOLERANCE: Activity tolerance: good  FUNCTIONAL OUTCOME MEASURES: MAM-20 sum score: 80/80 MAM-20 Measure Score: 100   UPPER EXTREMITY ROM:    Active ROM Right Eval Left Eval  Shoulder flexion Limited by recent defibrillator placement Limited by recent defibrillator placement  Shoulder abduction Limited by recent defibrillator placement Limited by recent defibrillator placement  Shoulder adduction    Shoulder extension    Shoulder internal rotation    Shoulder external rotation    Elbow flexion [facility] WFL  Elbow extension [facility]  Wrist flexion [facility] WFL  Wrist extension [facility] WFL  Wrist ulnar deviation    Wrist radial deviation    Wrist pronation    Wrist supination    (Blank rows = not tested)  UPPER EXTREMITY MMT:     MMT Right Eval  Left Eval   Shoulder flexion NT NT  Shoulder abduction NT NT  Shoulder adduction    Shoulder extension    Shoulder internal rotation    Shoulder external rotation    Middle trapezius    Lower trapezius    Elbow flexion 5/5 5/5  Elbow extension 5/5 5/5  Wrist flexion 5/5 5/5  Wrist extension  5/5 5/5  Wrist ulnar deviation    Wrist radial deviation    Wrist pronation    Wrist supination    (Blank rows = not tested)  HAND FUNCTION: Grip strength: Right: 76 lbs; Left: 84 lbs, Lateral pinch: Right: 17 lbs, Left: 17 lbs, and 3 point pinch: Right: 17 lbs, Left: 14 lbs  COORDINATION: 9 Hole Peg test: Right: 49 sec; Left: 29 sec  SENSATION: Wife reports that Pt. has numbness and tingling in the R hand from the LVAD.  Light touch sensation test was administered; sensation WFL.  EDEMA: none  MUSCLE TONE: WFL  COGNITION: Overall cognitive status: Pt. Caregiver reports  cognitive changes requiring increased time to perform daily tasks such as  figuring out his specific complex system for using 4 TV remote controls.  VISION: Subjective report: Pt. Wife said that initially he was bumping into things into his R side however that has improved. Baseline vision: Wears glasses all the time Visual history: Since the stroke, Pt. Has had vision changes on the R side, however, with the use of compensatory/scanning strategies, Pt. Wife reports that he is improving with more attention to the R side, and using scanning strategies.  PERCEPTION: WFL  PRAXIS: WFL                                                                                                                        TREATMENT DATE: 02/23/2024   OT initial Evaluation completed and patient caregiver education was provided as indicated below.    PATIENT EDUCATION: Education details: Grip/pinch strength, POC, FMC HEP, educated on theraputty HEP, encourage continued visual scanning within the environment. Person educated: Patient and Spouse Education method: Explanation, Demonstration, Tactile cues, and Verbal cues Education comprehension: verbalized understanding, returned demonstration, verbal cues required, and tactile cues required  HOME EXERCISE PROGRAM: -Theraputty hand strengthening exercises including: gross gripping with blue theraputty, gross digit extension, lateral, and 3pt. pinch strengthening, thumb opposition, bilateral alternating twisting motion in opposite directions with supination/pronation, rolling circular spheres within the tips of the fingers, and manipulating putty within the tips of fingers to remove small objects with the green theraputty. Pt. Was provided with a visual handout HEP through [employer] with a video access code. -Right hand FMC HEP with a visual handout    GOALS: OT initial evaluation completed, no goals are warranted at this time.   ASSESSMENT:  CLINICAL IMPRESSION: Patient is a 73 y.o. male who was seen today for occupational therapy  evaluation for L PCA CVA. Pt. Participated in the initial OT evaluation. Pt. Caregiver reports that the Pt. Has made progress since leaving Inpatient Rehab. Pt. Is independent with daily care tasks at home. Pt. Sum score on the MAM-20 is 80/80, with the MAM-20 Measure score of 100 indicating that the pt. Is able to use Bilateral hands efficiently during daily ADLs/IADLs. Pt. Was provided with HEP for continued R hand strengthening using blue theraputty for gross gripping and green theraputty for additional hand exercises.  Pt. Was provided with an HEP to continue to improve R hand [facility] skills. Pt. Is utilizing visual scanning and compensatory strategies throughout his environment. Additional OT services are not warranted at this time. Pt. And wife are in agreement.  PERFORMANCE DEFICITS: in functional skills including ADLs, Dominant RUE grip strength and FMC skills and psychosocial skills including environmental adaptation.   IMPAIRMENTS: are limiting patient from ADLs, IADLs, and leisure.   CO-MORBIDITIES: may have co-morbidities  that affects occupational performance. Patient will benefit from skilled OT to address above impairments and improve overall function.  MODIFICATION OR ASSISTANCE TO COMPLETE EVALUATION: Min-Moderate modification of tasks or assist with assess necessary to complete an evaluation.  OT OCCUPATIONAL PROFILE AND HISTORY: Detailed assessment: Review of records and additional review of physical, cognitive, psychosocial history related to current functional performance.  CLINICAL DECISION MAKING: Moderate - several treatment options, min-mod task modification necessary  REHAB POTENTIAL: Good  EVALUATION COMPLEXITY: Moderate    PLAN:  OT FREQUENCY: one time visit  OT DURATION: one visit only  PLANNED INTERVENTIONS: 97535 self care/ADL training, 16109 therapeutic exercise, and 97112 neuromuscular re-education  RECOMMENDED OTHER SERVICES: PT & ST  CONSULTED AND AGREED  WITH PLAN OF CARE: Patient and family member/caregiver  PLAN FOR NEXT SESSION: N/A   Debora Fallen, Student-OT 02/23/2024, 2:37 PM   This entire session was performed under direct supervision and direction of a licensed therapist/therapist assistant . I have personally read, edited and approve of the note as written.   Elaine Jagentenfl, MS, OTR/L  02/23/24

## 2024-02-23 NOTE — Therapy (Signed)
 OUTPATIENT SPEECH LANGUAGE PATHOLOGY  COGNITIVE COMMUNICATION EVALUATION   Patient Name: Isaac [name] MRN: [id number] DOB:Oct 28, 1950, 73 y.o., male Today's Date: 02/23/2024  PCP: Lyle San, MD REFERRING PROVIDER: Georjean Kite, PA   End of Session - 02/23/24 1321     Visit Number 1    Number of Visits 25    Date for SLP Re-Evaluation 05/17/24    Authorization Type [employer] HMO/PPO    Progress Note Due on Visit 10    SLP Start Time 1100    SLP Stop Time  1145    SLP Time Calculation (min) 45 min    Activity Tolerance Patient tolerated treatment well             Past Medical History:  Diagnosis Date   Arrhythmia    atrial fibrillation   CHF (congestive heart failure) (HCC)    Chronic kidney disease    Coronary artery disease    Hyperlipidemia    Hypertension    Myocardial infarct [facility])    Past Surgical History:  Procedure Laterality Date   CARDIAC DEFIBRILLATOR PLACEMENT  feb 2014   ICD GENERATOR CHANGEOUT N/A 02/09/2024   Procedure: ICD GENERATOR CHANGEOUT;  Surgeon: Tammie Fall, MD;  Location: [facility] INVASIVE CV LAB;  Service: Cardiovascular;  Laterality: N/A;   INSERTION OF IMPLANTABLE LEFT VENTRICULAR ASSIST DEVICE N/A 09/17/2021   Procedure: INSERTION OF IMPLANTABLE LEFT VENTRICULAR ASSIST DEVICE AND INSERTION OF FEMORAL ARTERIAL LINE;  Surgeon: Bartley Lightning, MD;  Location: [facility] OR;  Service: Open Heart Surgery;  Laterality: N/A;   IR FLUORO GUIDE CV LINE RIGHT  09/27/2021   IR THORACENTESIS ASP PLEURAL SPACE W/IMG GUIDE  09/27/2021   IR US  GUIDE VASC ACCESS RIGHT  09/27/2021   PLACEMENT OF IMPELLA LEFT VENTRICULAR ASSIST DEVICE N/A 09/12/2021   Procedure: PLACEMENT OF IMPELLA 5.5 LEFT VENTRICULAR ASSIST DEVICE;  Surgeon: Zelphia Higashi, MD;  Location: [facility] OR;  Service: Open Heart Surgery;  Laterality: N/A;   RIGHT HEART CATH N/A 09/08/2021   Procedure: RIGHT HEART CATH;  Surgeon: Darlis Eisenmenger, MD;  Location: [facility] INVASIVE CV LAB;  Service:  Cardiovascular;  Laterality: N/A;   TEE WITHOUT CARDIOVERSION N/A 09/12/2021   Procedure: TRANSESOPHAGEAL ECHOCARDIOGRAM (TEE);  Surgeon: Zelphia Higashi, MD;  Location: [facility] OR;  Service: Open Heart Surgery;  Laterality: N/A;   TEE WITHOUT CARDIOVERSION N/A 09/17/2021   Procedure: TRANSESOPHAGEAL ECHOCARDIOGRAM (TEE);  Surgeon: Bartley Lightning, MD;  Location: [facility] OR;  Service: Open Heart Surgery;  Laterality: N/A;   Patient Active Problem List   Diagnosis Date Noted   Hematoma 02/17/2024   Anemia 02/15/2024   Hypotension 02/15/2024   Sundowning 02/11/2024   Insomnia 02/11/2024   Constipation 02/11/2024   Stage 3b chronic kidney disease (HCC) 02/11/2024   Cardioembolic stroke (HCC) 02/10/2024   Acute left PCA stroke (HCC) 02/05/2024   Abnormal thyroid  blood test 04/05/2023   Abscess of left groin 11/27/2022   VT (ventricular tachycardia) (HCC) 10/27/2022   ICD (implantable cardioverter-defibrillator) in place 10/27/2022   Pressure injury of skin 10/07/2021   LVAD (left ventricular assist device) present (HCC)    Protein-calorie malnutrition, severe 09/10/2021   Elevated TSH    PICC (peripherally inserted central catheter) in place    Cardiogenic shock (HCC)    Syncope 09/07/2021   CHF (congestive heart failure) (HCC) 09/06/2021   CAD (coronary artery disease) 08/22/2021   Cardiomyopathy (HCC) 08/22/2021   Mixed hyperlipidemia 08/22/2021   A-fib (HCC) 08/22/2021   Elevated  troponin 08/22/2021   Acute on chronic combined systolic and diastolic CHF (congestive heart failure) (HCC) 08/22/2021   Benign essential hypertension 02/22/2015   Chronic systolic heart failure (HCC) 07/05/2014    ONSET DATE: 02/05/2024; date of referral  02/22/2024  REFERRING DIAG: E45.409 (ICD-10-CM) - Cerebral infarction due to unspecified occlusion or stenosis of left posterior cerebral artery  THERAPY DIAG:  Cognitive communication deficit  Aphasia  Cardioembolic stroke (HCC)  Rationale for  Evaluation and Treatment Rehabilitation  SUBJECTIVE:   SUBJECTIVE STATEMENT: Pt pleasant, intermittent slightly off-topic response observed Pt accompanied by: significant other  PERTINENT HISTORY and DIAGNOSTIC FINDINGS:  [name] is a 73 y.o. with history of CAD, HTN, atrial fibrillation, CKD IV, VT and chronic systolic CHFs/p HM-3 LVAD on 09/17/21.  He presented to the [facility] ED on 02/05/24 for focal neuro deficits with R vision field cut and dysmetria. Code Stroke activated. CT brain positive for acute to subacute appearing Left PCA and Left cerebellar infarcts. Hyperdense Left PCA suggests occlusion of that vessel. No hemorrhagic transformation or mass effect. Seen by Neuro and decision made to treat with IV heparin . Echocardiogram shows EF 25 to 30% with LVAD at the apex.  Left atrial is moderately dilate. CT angio distal left P2/P3 occlusion.Repeat Ct head 02/06/24 with evolving left cerebellar, left PCA and right frontal gyrus MCA branch infarct. No hemorrhage.  Neurology feels infarcts 2/2 LVAD and signed off  Pt attended [facility] from until 02/18/2024, received ST services targeting cognitive linguistic abilities and discharged at [facility] for cognitive linguistic skills. "Memory/orientation deficits remain the pt's biggest barrier to progress and overall safety."   PAIN:  Are you having pain? No   FALLS: Has patient fallen in last 6 months?  No  LIVING ENVIRONMENT: Lives with: lives with their spouse Lives in: House/apartment  PLOF:  Level of assistance: Independent with ADLs, Independent with IADLs Employment: Retired   PATIENT GOALS   ***  OBJECTIVE:   COGNITIVE COMMUNICATION Overall cognitive status: {cognition:24006} Areas of impairment:  {ARMCcognitiveimpairment:28858} Functional Impairments: ***  AUDITORY COMPREHENSION  Overall auditory comprehension: {IMPAIRED:25374} YES/NO questions: {IMPAIRED:25374} Following directions:  {IMPAIRED:25374} Conversation: {SLP conversation:25430} Interfering components: {SLP interfering components:25431} Effective technique: {SLP effective technique:25432}  READING COMPREHENSION: {SLPreadingcomprehension:27140}  EXPRESSION: {SLP EXPRESSION:25433}  VERBAL EXPRESSION:   Overall verbal expression: {IMPAIRED:25374} Level of generative/spontaneous verbalization: {SLP level of generative/spontaneious verbalization:25435} Automatic speech: {SLP ATOMIC SPEECH:25434}  Repetition: {SLPrepetion:27212} Naming: {SLPnaming:27214} Pragmatics: {slppragmatics:27216} Comments: *** Interfering components: {SLP INTERFERING COMPONENTS:25436} Effective technique: {SLP EFFECTIVE TECHNIQUE:25437} Non-verbal means of communication: {SLP non verbal means of communication:25438}  WRITTEN EXPRESSION: Dominant hand: {RIGHT/LEFT:20294} Written expression: {slpwrittenexp:27209}  ORAL MOTOR EXAMINATION Facial : {OM Face:25663} Lingual: {OM Lingual:25664} Velum: {OM Velum:25665} Mandible: {OM mandible:25667} Cough: {OM Cough:25660} Voice: {OM Voice:25662}  MOTOR SPEECH: Overall motor speech: {slpimpaired:27210} Level of impairment: {SLP level of impairment:25441} Respiration: {respbreathing:27195} Phonation: {SLP phonation:25439} Resonance: {SLP resonance:25440} Articulation: {SLParticulation:27218} Intelligibility: {SLP Intelligible:25442} Motor planning: {slpmotorspeecherrors:27220} Motor speech errors: {SLP motor speech errors:25443} Interfering components: {SLP Interfering components (MS):25444} Effective technique: {SLP effective technique (MS):25445}   STANDARDIZED ASSESSMENTS: {SLPCOGLINGassessments:27787}  PATIENT REPORTED OUTCOME MEASURES (PROM): {SLPPROM:27785}   TODAY'S TREATMENT:  ***   PATIENT EDUCATION: Education details: *** Person educated: {Person educated:25204} Education method: {Education Method:25205} Education comprehension: {Education  Comprehension:25206}   HOME EXERCISE PROGRAM:   ***   GOALS:  Goals reviewed with patient? {yes/no:20286}  SHORT TERM GOALS: Target date: 10 sessions  *** Baseline: Goal status: {GOALSTATUS:25110}  2.  *** Baseline:  Goal status: {  GOALSTATUS:25110}  3.  *** Baseline:  Goal status: {GOALSTATUS:25110}  4.  *** Baseline:  Goal status: {GOALSTATUS:25110}  5.  *** Baseline:  Goal status: {GOALSTATUS:25110}  6.  *** Baseline:  Goal status: {GOALSTATUS:25110}  LONG TERM GOALS: Target date:   *** Baseline:  Goal status: {GOALSTATUS:25110}  2.  *** Baseline:  Goal status: {GOALSTATUS:25110}  3.  *** Baseline:  Goal status: {GOALSTATUS:25110}  4.  *** Baseline:  Goal status: {GOALSTATUS:25110}  5.  *** Baseline:  Goal status: {GOALSTATUS:25110}  6.  *** Baseline:  Goal status: {GOALSTATUS:25110}  ASSESSMENT:  CLINICAL IMPRESSION: Patient is a *** y.o. *** who was seen today for ***.   OBJECTIVE IMPAIRMENTS include {SLPOBJIMP:27107}. These impairments are limiting patient from {SLPLIMIT:27108}. Factors affecting potential to achieve goals and functional outcome are {SLP factors:25450}. Patient will benefit from skilled SLP services to address above impairments and improve overall function.  REHAB POTENTIAL: {rehabpotential:25112}  PLAN: SLP FREQUENCY: {rehab frequency:25116}  SLP DURATION: {rehab duration:25117}  PLANNED INTERVENTIONS: {SLP treatment/interventions:25449}   Lashawne Dura B. Garlin Junker, M.S., CCC-SLP, Tree surgeon Certified Brain Injury Specialist [facility]  [facility] Rehabilitation Services Office [phone] Ascom [phone] Fax [phone]

## 2024-02-23 NOTE — Progress Notes (Addendum)
 Normal dual chamber ICD wound check. Wound well healed with minimal swelling just distal to the incision site- patient on warfarin. Presenting rhythm: AP/VS- 60 . Routine testing performed. Thresholds, sensing, and impedances consistent with prior measurements. No treated arrhythmias. HVT are programmed off.  Pt enrolled in remote follow-up.  Patient advised to call back to the Device Clinic is any increase in swelling is noted at his implant site.

## 2024-02-23 NOTE — Therapy (Unsigned)
 OUTPATIENT PHYSICAL THERAPY NEURO EVALUATION   Patient Name: Isaac [name] MRN: [id number] DOB:01/28/1951, 73 y.o., male Today's Date: 02/23/2024   PCP: Lyle San, MD  REFERRING PROVIDER: Sterling Eisenmenger, PA-C   END OF SESSION:  PT End of Session - 02/23/24 1013     Visit Number 1    Number of Visits 24    Date for PT Re-Evaluation 05/17/24    PT Start Time 1015    PT Stop Time 1056    PT Time Calculation (min) 41 min    Equipment Utilized During Treatment Gait belt    Behavior During Therapy WFL for tasks assessed/performed             Past Medical History:  Diagnosis Date   Arrhythmia    atrial fibrillation   CHF (congestive heart failure) (HCC)    Chronic kidney disease    Coronary artery disease    Hyperlipidemia    Hypertension    Myocardial infarct [facility])    Past Surgical History:  Procedure Laterality Date   CARDIAC DEFIBRILLATOR PLACEMENT  feb 2014   ICD GENERATOR CHANGEOUT N/A 02/09/2024   Procedure: ICD GENERATOR CHANGEOUT;  Surgeon: Tammie Fall, MD;  Location: [facility] INVASIVE CV LAB;  Service: Cardiovascular;  Laterality: N/A;   INSERTION OF IMPLANTABLE LEFT VENTRICULAR ASSIST DEVICE N/A 09/17/2021   Procedure: INSERTION OF IMPLANTABLE LEFT VENTRICULAR ASSIST DEVICE AND INSERTION OF FEMORAL ARTERIAL LINE;  Surgeon: Bartley Lightning, MD;  Location: [facility] OR;  Service: Open Heart Surgery;  Laterality: N/A;   IR FLUORO GUIDE CV LINE RIGHT  09/27/2021   IR THORACENTESIS ASP PLEURAL SPACE W/IMG GUIDE  09/27/2021   IR US  GUIDE VASC ACCESS RIGHT  09/27/2021   PLACEMENT OF IMPELLA LEFT VENTRICULAR ASSIST DEVICE N/A 09/12/2021   Procedure: PLACEMENT OF IMPELLA 5.5 LEFT VENTRICULAR ASSIST DEVICE;  Surgeon: Zelphia Higashi, MD;  Location: [facility] OR;  Service: Open Heart Surgery;  Laterality: N/A;   RIGHT HEART CATH N/A 09/08/2021   Procedure: RIGHT HEART CATH;  Surgeon: Darlis Eisenmenger, MD;  Location: [facility] INVASIVE CV LAB;  Service: Cardiovascular;  Laterality:  N/A;   TEE WITHOUT CARDIOVERSION N/A 09/12/2021   Procedure: TRANSESOPHAGEAL ECHOCARDIOGRAM (TEE);  Surgeon: Zelphia Higashi, MD;  Location: [facility] OR;  Service: Open Heart Surgery;  Laterality: N/A;   TEE WITHOUT CARDIOVERSION N/A 09/17/2021   Procedure: TRANSESOPHAGEAL ECHOCARDIOGRAM (TEE);  Surgeon: Bartley Lightning, MD;  Location: [facility] OR;  Service: Open Heart Surgery;  Laterality: N/A;   Patient Active Problem List   Diagnosis Date Noted   Hematoma 02/17/2024   Anemia 02/15/2024   Hypotension 02/15/2024   Sundowning 02/11/2024   Insomnia 02/11/2024   Constipation 02/11/2024   Stage 3b chronic kidney disease (HCC) 02/11/2024   Cardioembolic stroke (HCC) 02/10/2024   Acute left PCA stroke (HCC) 02/05/2024   Abnormal thyroid  blood test 04/05/2023   Abscess of left groin 11/27/2022   VT (ventricular tachycardia) (HCC) 10/27/2022   ICD (implantable cardioverter-defibrillator) in place 10/27/2022   Pressure injury of skin 10/07/2021   LVAD (left ventricular assist device) present (HCC)    Protein-calorie malnutrition, severe 09/10/2021   Elevated TSH    PICC (peripherally inserted central catheter) in place    Cardiogenic shock (HCC)    Syncope 09/07/2021   CHF (congestive heart failure) (HCC) 09/06/2021   CAD (coronary artery disease) 08/22/2021   Cardiomyopathy (HCC) 08/22/2021   Mixed hyperlipidemia 08/22/2021   A-fib (HCC) 08/22/2021   Elevated troponin 08/22/2021  Acute on chronic combined systolic and diastolic CHF (congestive heart failure) (HCC) 08/22/2021   Benign essential hypertension 02/22/2015   Chronic systolic heart failure (HCC) 07/05/2014    ONSET DATE: 02/05/24  REFERRING DIAG:  L87.564 (ICD-10-CM) - Cerebral infarction due to unspecified occlusion or stenosis of left posterior cerebral artery   THERAPY DIAG:  Abnormality of gait  Muscle weakness (generalized)  Imbalance  Rationale for Evaluation and Treatment: Rehabilitation  SUBJECTIVE:                                                                                                                                                                                              SUBJECTIVE STATEMENT: Presents to PT for recent CVA. Of note, pt has pacemaker, and LVAD. Pt states that he still feels like he has no energy and feels a little off balance. Wife states that he is doing much better, as he had no movement in the RLE initially, and wants him to get as much PT as possible to allow as much recovery as possible.   Pt accompanied by: significant other  PERTINENT HISTORY:  Presented 02/05/2024 when his pacemaker went off abruptly resulting in a fall as well as noted right sided weakness and aphasia with blurred vision. Cranial CT scan positive for acute to subacute appearing left PCA left cerebellar infarction. Hyperdense left PCA suggesting occlusion of that vessel. He was also diagnosed with right frontal gyrus MCA infarction. No hemorrhagic transformation or mass effect. Underlying advanced chronic small vessel disease. Right supraorbital forehead recent soft tissue injury with no skull fracture identified. CTA head and neck demonstrating subtotal occlusion of the left PCA in the P2/P3 junction and additionally moderate to severe bilateral ICA siphon atherosclerosis with a least moderate siphon stenosis bilaterally.  Admitted to  rehab 02/10/2024 for inpatient therapies to consist of PT, ST and OT at least three hours five days a week. Past admission physiatrist, therapy team and rehab RN have worked together to provide customized collaborative inpatient rehab.  Pertaining to patient's cardioembolic left cerebellar, PCA and right frontal MCA branch infarction secondary to LVAD device despite being on anticoagulation with warfarin.  Patient remained on Coumadin  therapy goal INR 2.5-3.0.  Atrial fibrillation/VT chronic systolic congestive heart failure status post LVAD 09/17/2021.  Continued Revatio   followed by heart failure team status post generator change 02/09/2024.   PAIN:  Are you having pain? No  PRECAUTIONS: ICD/Pacemaker and Other: LVAD  RED FLAGS: None and LVAD   WEIGHT BEARING RESTRICTIONS: LVAD and pace maker.   FALLS: Has patient fallen in last 6 months? No and pt unsure   LIVING  ENVIRONMENT: Lives with: lives with their spouse Lives in: House/apartment Stairs: Yes: Internal: 9 steps; on right going up Has following equipment at home: Single point cane, Walker - 2 wheeled, and does not use  PLOF: Independent with basic ADLs and Independent with household mobility without device  PATIENT GOALS: pt reports that sight in the L is limited.  Improve balance slightly.   OBJECTIVE:  Note: Objective measures were completed at Evaluation unless otherwise noted.  DIAGNOSTIC FINDINGS:  CT head: IMPRESSION: 1. Expected evolution of Left cerebellar and Left PCA infarcts since yesterday. No hemorrhage or mass effect. 2. Evolution also of a Right MCA middle frontal gyrus infarct predicted by abnormal CTP T-max yesterday. No hemorrhage or mass effect. 3. Consider a recent Embolic event given the recent anterior and posterior vascular territory infarcts.  CT neck  IMPRESSION: 1. CTP does not detect the known Left PCA infarct core (although T-max oligemia corresponds to that finding), while CTA demonstrates subtotal occlusion of the Left PCA at the P2/P3 junction (some distal flow).   2. CTP also suggests Oligemia in the anterior Right MCA division, although no MCA branch occlusion identified by CTA.   3. The above were discussed by telephone with Dr. Janett Medin on 02/05/2024 at 12:36 .   4. Additionally, Moderate to Severe bilateral ICA siphon atherosclerosis with at least Moderate siphon stenosis bilaterally (Moderate to Severe at the Right anterior genu).   5. Extracranial atherosclerosis but no significant Vertebral artery or extracranial carotid stenosis.   6.  Aortic Atherosclerosis (ICD10-I70.0) and Emphysema (ICD10-J43.9).  DG chest FINDINGS: Single frontal view of the chest demonstrates stable left ventricular assist device and multi lead pacer/AICD. Cardiac silhouette remains enlarged. No acute airspace disease, effusion, or pneumothorax. No acute bony abnormalities.     COGNITION: Overall cognitive status: delayed response to history questions    SENSATION: WFL  COORDINATION:   Decreased speed of movement on the RLE>   POSTURE: forward head  LOWER EXTREMITY ROM:     AROM. Grossly WFL.   LOWER EXTREMITY MMT:    MMT Right Eval Left Eval  Hip flexion 4+ 4+  Hip extension    Hip abduction 5 5  Hip adduction 5 5  Hip internal rotation    Hip external rotation    Knee flexion 5 5  Knee extension 5 5  Ankle dorsiflexion 4+ 4+  Ankle plantarflexion    Ankle inversion    Ankle eversion    (Blank rows = not tested)  BED MOBILITY:  Not tested  TRANSFERS: Sit to stand: Complete Independence  Assistive device utilized: None     Stand to sit: Complete Independence  Assistive device utilized: None     Chair to chair: SBA  Assistive device utilized: None       RAMP:  Not tested  CURB:  Findings: CGA 1 UE support   STAIRS: Findings: Level of Assistance: SBA, Stair Negotiation Technique: Step to Pattern with Single Rail on Right Bilateral Rails, Number of Stairs: 8, Height of Stairs: 6   , and Comments: step through ascent step to descent  GAIT: Findings: Gait Characteristics: abducted- Right, Distance walked: 60, Assistive device utilized:None, Level of assistance: SBA and CGA, and Comments: mild adduction on the RLE.   FUNCTIONAL TESTS:  5 times sit to stand: 17.14 Timed up and go (TUG): 13.82 sec no AD 6 minute walk test: to be completed  10 meter walk test: 1.10m/s Berg Balance Scale: To be completed.  Dynamic Gait Index: 18  FGA: 21  PATIENT SURVEYS:  Stroke Impact Scale 71%                                                                                                                               TREATMENT DATE: 02/23/2024   HEP provided see below.   PATIENT EDUCATION: Education details: POC.  Person educated: Patient Education method: Explanation Education comprehension: verbalized understanding  HOME EXERCISE PROGRAM:  Access Code: [email or web address] URL: [email or web address] Date: 02/23/2024 Prepared by: Aurora Lees  Exercises - Tandem Stance with Support  - 1 x daily - 4 x weekly - 3 sets - 4 reps - 20 seconds  hold - Standing Narrow Base of Support with Chair  - 1 x daily - 4 x weekly - 3 sets - 10 reps - 20 seconds.  hold - Feet Together Balance at [employer] Eyes Closed  - 1 x daily - 4 x weekly - 3 sets - 4 reps - 10seconds hold - Forward and Backward Walking with Eyes Closed and Counter Support  - 1 x daily - 4 x weekly - 3 sets - 5 reps  GOALS: Goals reviewed with patient? Yes  SHORT TERM GOALS: Target date: 03/29/2024    Patient will be independent in home exercise program to improve strength/mobility for better functional independence with ADLs. Baseline: provided on 6/4 Goal status: INITIAL   LONG TERM GOALS: Target date: 05/17/2024    Patient will increase SIS 16 score to equal to or greater than  10 points.    to demonstrate statistically significant improvement in mobility and quality of life.  Baseline: 71% Goal status: INITIAL  2.  Patient (> 65 years old) will complete five times sit to stand test in < 15 seconds indicating an increased LE strength and improved balance. Baseline: 17.14sec Goal status: INITIAL  3.  Patient will increase Berg Balance score by > 6 points to demonstrate decreased fall risk during functional activities Baseline: to be completed  Goal status: INITIAL   4.  Patient will reduce timed up and go to <11 seconds to reduce fall risk and demonstrate improved transfer/gait ability. Baseline: 13.82 seconds  Goal status:  INITIAL  5.  Patient will increase FGA  score to >/24 as to demonstrate reduced fall risk and improved dynamic gait balance for better safety with community/home ambulation.   Baseline: 21 Goal status: INITIAL  6.  Patient will increase 6 min walk test >120ft to demonstrate decreased fall risk during functional activities as well as improved access to community mobility.  Baseline: to be completed  Goal status: INITIAL   ASSESSMENT:  CLINICAL IMPRESSION: Patient is a 73 y.o. males who was seen today for physical therapy evaluation and treatment for recent CVA of PCA, and MCA. Pt demonstrates mild decreased strength in the RLE and increased fall risk due to increased time on TUG and 5xSTS as well as decreased score on the FGA indicating increased fall risk. HEP  provided on this day with hand out of completion on days when not receiving PT. Due to increased fall risk and decreased access to community, pt will benefit from skilled PT to allow return to PLOF and increased overall QoL.   OBJECTIVE IMPAIRMENTS: Abnormal gait, cardiopulmonary status limiting activity, decreased activity tolerance, decreased balance, decreased coordination, decreased endurance, decreased mobility, difficulty walking, decreased strength, and improper body mechanics.   ACTIVITY LIMITATIONS: carrying, lifting, squatting, stairs, transfers, locomotion level, and caring for others  PARTICIPATION LIMITATIONS: cleaning, laundry, driving, shopping, community activity, occupation, and yard work  PERSONAL FACTORS: Age and 3+ comorbidities: LVAD, Pacemaker, hypotension  are also affecting patient's functional outcome.   REHAB POTENTIAL: Excellent  CLINICAL DECISION MAKING: Stable/uncomplicated  EVALUATION COMPLEXITY: Low  PLAN:  PT FREQUENCY: 1-2x/week  PT DURATION: 12 weeks  PLANNED INTERVENTIONS: 97164- PT Re-evaluation, 97750- Physical Performance Testing, 97110-Therapeutic exercises, 97530- Therapeutic  activity, [id number]- Neuromuscular re-education, 97535- Self Care, 16109- Manual therapy, [phone]- Gait training, Patient/Family education, Balance training, Stair training, Taping, DME instructions, Cryotherapy, and Moist heat  PLAN FOR NEXT SESSION:  Complete 6 min walk test and berg.  Dynamic balance training and HEP adjustment.    Barbara Book, PT 02/23/2024, 10:15 AM

## 2024-02-24 ENCOUNTER — Ambulatory Visit

## 2024-02-25 ENCOUNTER — Other Ambulatory Visit (HOSPITAL_COMMUNITY): Payer: Self-pay

## 2024-02-25 ENCOUNTER — Ambulatory Visit (HOSPITAL_COMMUNITY): Payer: Self-pay | Admitting: Pharmacist

## 2024-02-25 DIAGNOSIS — Z95811 Presence of heart assist device: Secondary | ICD-10-CM

## 2024-02-25 DIAGNOSIS — Z7901 Long term (current) use of anticoagulants: Secondary | ICD-10-CM

## 2024-02-25 LAB — POCT INR: INR: 2.7 (ref 2.0–3.0)

## 2024-02-28 ENCOUNTER — Ambulatory Visit (HOSPITAL_COMMUNITY): Payer: Self-pay | Admitting: Pharmacist

## 2024-02-28 ENCOUNTER — Other Ambulatory Visit (HOSPITAL_COMMUNITY): Payer: Self-pay | Admitting: *Deleted

## 2024-02-28 ENCOUNTER — Ambulatory Visit (HOSPITAL_COMMUNITY): Payer: Self-pay | Admitting: Cardiology

## 2024-02-28 ENCOUNTER — Ambulatory Visit (HOSPITAL_COMMUNITY)
Admission: RE | Admit: 2024-02-28 | Discharge: 2024-02-28 | Disposition: A | Discharge status: Home / Self Care | Source: Ambulatory Visit | Attending: Cardiology | Admitting: Cardiology

## 2024-02-28 VITALS — BP 102/73 | HR 71 | Ht 69.0 in | Wt 178.4 lb

## 2024-02-28 DIAGNOSIS — Z8673 Personal history of transient ischemic attack (TIA), and cerebral infarction without residual deficits: Secondary | ICD-10-CM | POA: Diagnosis not present

## 2024-02-28 DIAGNOSIS — Z79899 Other long term (current) drug therapy: Secondary | ICD-10-CM | POA: Insufficient documentation

## 2024-02-28 DIAGNOSIS — I480 Paroxysmal atrial fibrillation: Secondary | ICD-10-CM | POA: Insufficient documentation

## 2024-02-28 DIAGNOSIS — I5022 Chronic systolic (congestive) heart failure: Secondary | ICD-10-CM

## 2024-02-28 DIAGNOSIS — Z7901 Long term (current) use of anticoagulants: Secondary | ICD-10-CM | POA: Insufficient documentation

## 2024-02-28 DIAGNOSIS — N184 Chronic kidney disease, stage 4 (severe): Secondary | ICD-10-CM | POA: Insufficient documentation

## 2024-02-28 DIAGNOSIS — R946 Abnormal results of thyroid function studies: Secondary | ICD-10-CM | POA: Insufficient documentation

## 2024-02-28 DIAGNOSIS — Z95811 Presence of heart assist device: Secondary | ICD-10-CM

## 2024-02-28 DIAGNOSIS — I429 Cardiomyopathy, unspecified: Secondary | ICD-10-CM | POA: Insufficient documentation

## 2024-02-28 DIAGNOSIS — R9431 Abnormal electrocardiogram [ECG] [EKG]: Secondary | ICD-10-CM | POA: Diagnosis not present

## 2024-02-28 DIAGNOSIS — I2510 Atherosclerotic heart disease of native coronary artery without angina pectoris: Secondary | ICD-10-CM | POA: Insufficient documentation

## 2024-02-28 DIAGNOSIS — Z8249 Family history of ischemic heart disease and other diseases of the circulatory system: Secondary | ICD-10-CM | POA: Insufficient documentation

## 2024-02-28 DIAGNOSIS — Z95810 Presence of automatic (implantable) cardiac defibrillator: Secondary | ICD-10-CM | POA: Insufficient documentation

## 2024-02-28 DIAGNOSIS — Z955 Presence of coronary angioplasty implant and graft: Secondary | ICD-10-CM | POA: Diagnosis not present

## 2024-02-28 DIAGNOSIS — I130 Hypertensive heart and chronic kidney disease with heart failure and stage 1 through stage 4 chronic kidney disease, or unspecified chronic kidney disease: Secondary | ICD-10-CM | POA: Diagnosis not present

## 2024-02-28 DIAGNOSIS — Z4509 Encounter for adjustment and management of other cardiac device: Secondary | ICD-10-CM | POA: Diagnosis not present

## 2024-02-28 LAB — CBC
HCT: 36.8 % — ABNORMAL LOW (ref 39.0–52.0)
Hemoglobin: 11.9 g/dL — ABNORMAL LOW (ref 13.0–17.0)
MCH: 29.7 pg (ref 26.0–34.0)
MCHC: 32.3 g/dL (ref 30.0–36.0)
MCV: 91.8 fL (ref 80.0–100.0)
Platelets: 220 10*3/uL (ref 150–400)
RBC: 4.01 MIL/uL — ABNORMAL LOW (ref 4.22–5.81)
RDW: 14.3 % (ref 11.5–15.5)
WBC: 4.2 10*3/uL (ref 4.0–10.5)
nRBC: 0 % (ref 0.0–0.2)

## 2024-02-28 LAB — COMPREHENSIVE METABOLIC PANEL WITH GFR
ALT: 23 U/L (ref 0–44)
AST: 33 U/L (ref 15–41)
Albumin: 3.7 g/dL (ref 3.5–5.0)
Alkaline Phosphatase: 80 U/L (ref 38–126)
Anion gap: 7 (ref 5–15)
BUN: 45 mg/dL — ABNORMAL HIGH (ref 8–23)
CO2: 25 mmol/L (ref 22–32)
Calcium: 9.3 mg/dL (ref 8.9–10.3)
Chloride: 108 mmol/L (ref 98–111)
Creatinine, Ser: 2.24 mg/dL — ABNORMAL HIGH (ref 0.61–1.24)
GFR, Estimated: 31 mL/min — ABNORMAL LOW (ref >60)
Glucose, Bld: 113 mg/dL — ABNORMAL HIGH (ref 70–99)
Potassium: 4.3 mmol/L (ref 3.5–5.1)
Sodium: 140 mmol/L (ref 135–145)
Total Bilirubin: 0.9 mg/dL (ref 0.0–1.2)
Total Protein: 7.5 g/dL (ref 6.5–8.1)

## 2024-02-28 LAB — PREALBUMIN: Prealbumin: 32 mg/dL (ref 18–38)

## 2024-02-28 LAB — PROTIME-INR
INR: 2.2 — ABNORMAL HIGH (ref 0.8–1.2)
Prothrombin Time: 24.4 s — ABNORMAL HIGH (ref 11.4–15.2)

## 2024-02-28 LAB — LACTATE DEHYDROGENASE: LDH: 175 U/L (ref 98–192)

## 2024-02-28 LAB — MAGNESIUM: Magnesium: 2.5 mg/dL — ABNORMAL HIGH (ref 1.7–2.4)

## 2024-02-28 MED ORDER — POTASSIUM CHLORIDE CRYS ER 10 MEQ PO TBCR: 20.0000 meq | EXTENDED_RELEASE_TABLET | Freq: Every day | ORAL | 3 refills | Status: DC

## 2024-02-28 NOTE — Addendum Note (Signed)
 Encounter addended by: Edman Gory, RN on: 02/28/2024 2:14 PM  Actions taken: Visit diagnoses modified, Clinical Note Signed

## 2024-02-28 NOTE — Progress Notes (Signed)
 Cardiology: Dr. Mitzie Anda PCP: Lyle San, MD  Chief complaint: CHF  Follow up for Heart Failure/LVAD: 73 y.o. with history of CAD, HTN, atrial fibrillation, and chronic systolic CHF returns for followup of CHF/LVAD.   Patient has had a long-standing cardiomyopathy, EF had been in the 25% range for years.  He had PCI to OM1 in 2007 and RCA in 2013.  From his report, these episodes do not sound like ACS events.  Echo in 12/22 showed EF < 20%, severe LV dilation, restrictive diastolic function, moderate RV dysfunction, moderate MR, mod-severe TR. HF was been complicated by CKD stage 3. He was also noted on device interrogation to have been in atrial fibrillation persistently since 10/22.  He was admitted at [facility] in 12/22.  BP was low, his BP-active meds were stopped and midodrine  was begun.  He was diuresed and discharged home.    He reports ongoing severe dyspnea and was admitted again in 12/22 after he stood up then passed out at home.  Device interrogation showed run of VT treated x 2 with ATP possibly around the time of syncope.  RHC in 12/22 showed low output HF and he developed cardiogenic shock. Impella 5.5 was placed.  On 09/17/21, HM3 LVAD was placed, ICD leads were plastered to the tricuspid valve with severe TR, the valve was not replaced.  The patient had significant interoperative hypotension.  Post-operatively, he developed progressive renal failure and was started on CVVH then iHD.  He had a bumpy post-op course with renal failure and RV failure, but was ultimately able to discharge home on 10/24/21.   Ramp echo in 3/23, RV mildly dilated with normal systolic function, severe TR, LV EF 20-25%, aortic valve opens every beat.  Speed increased to 5300 rpm. The IV septum was mildly leftwards at 5300 rpm but flow increased and AoV still opened every beat.   Patient was admitted in 3/24 with left groin abscess.  This spontaneously drained and was treated with IV antibiotics.  He was sent home on  linezolid  and Augmentin .  He developed profuse diarrhea that he attributed to linezolid .  Linezolid  was stopped and he was continued on Augmentin .   Echo in 7/24 showed EF < 20%, IV septum midline to slightly leftward, RV moderately dilated with moderately decreased systolic function, IVC dilated with severe TR.  Speed not changed.   He was admitted with dysarthria and right-sided weakness in 5/25.  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 Echo this admission showed EF 20-25%, mild LVH, LVAD present, mild RV dysfunction, moderate AI, mild RV dysfunction with normal RV size, severe TR.  INR goal increased to 2.5-3. Dual chamber ICD generator was also changed this admission, needs pacing at times with bradycardia.   Patient returns for LVAD followup.  He is much improved, strength nearly normal on right side.  Wife says that cognition is still a little slower than it was in the past.  No dyspnea walking on flat ground or up the stairs in his home.  No lightheadedness.  No palpitations.  He is a-paced today.    Denies LVAD alarms.  Denies driveline trauma, erythema or drainage.  Denies ICD shocks.   Reports taking Coumadin  as prescribed and adherence to anticoagulation based dietary restrictions.  Denies bright red blood per rectum or melena, no dark urine or hematuria.    ECG (personally reviewed): A-V sequential pacing  Labs (3/23): LFTs normal, TSH mildly elevated Labs (7/23): hgb 12.1 Labs (8/23):  hgb 11.8 => 9.9, K 4.4, creatinine 2.5 => 3.09, LFTs normal.  Labs (11/23): K 4.5, creatinine 2.95 Labs (1/24): TSH 5.2 Labs (3/24): hgb 10.7, LDH 157, LFTs normal, creatinine 2.75 Labs (5/24): K 3.6, creatinine 2.7, LFTs normal, TSH normal Labs (7/24): hgb 11.7, LDH 172, K 3.5, creatinine 2.33 Labs (10/24): K 3.4, creatinine 2.24 Labs (12/24): K 4, creatinine 2.37 Labs (2/25): TSH normal Labs (3/25): hgb 12.5, K 4.9, creatinine 2.57 Labs (4/25): creatinine 2.8 Labs  (5/25): K 4, creatinine 1.97, LDH 163, hgb 12.3  PMH: 1. HTN 2. Hyperlipidemia 3. CAD: H/o PCI to OM1 2007 and PCI to RCA in 2013.  Uncertain of circumstances but do not sound like ACS events.  4. CKD stage IV: Patient was on HD for several months post-LVAD but was able to come off.  5. Atrial fibrillation: Paroxysmal 6. Chronic systolic CHF: Long-standing cardiomyopathy with EF in the 25% range.  Cause uncertain, has history of PCI to RCA and OM1, but degree of CAD does not seem to explain severity of cardiomyopathy. Father had history of CAD/CHF.  Not heavy drinker. Has [employer] dual chamber ICD.  - Echo (12/22): EF < 20%, severe LV dilation, restrictive diastolic function, moderate RV dysfunction, moderate MR, mod-severe TR.  - 12/22 cardiogenic shock with Impella 5.5 placement then Heartmate 3 LVAD.  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  - Echo (7/24): EF < 20%, IV septum midline to slightly leftward, RV moderately dilated with moderately decreased systolic function, IVC dilated with severe TR. - Echo (5/25): EF 20-25%, mild LVH, LVAD present, mild RV dysfunction, moderate AI, mild RV dysfunction with normal RV size, severe TR.  7. VT - Nausea with amiodarone .  8. RV failure/severe TR 9. Citrobacter UTI (8/23) 10. Left groin abscess 3/24 11. 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    Current Outpatient Medications  Medication Sig Dispense Refill   acetaminophen  (TYLENOL ) 325 MG tablet Take 2 tablets (650 mg total) by mouth every 4 (four) hours as needed for headache or mild pain (pain score 1-3).     furosemide  (LASIX ) 20 MG tablet Take 2 tablets (40 mg total) by mouth daily. 60 tablet 0   magnesium  oxide (MAG-OX) 400 (240 Mg) MG tablet Take 1 tablet (400 mg total) by mouth daily. 30 tablet 0   pantoprazole  (PROTONIX ) 40 MG tablet Take 1 tablet (40 mg total) by mouth daily. 30 tablet 0   potassium chloride  SA  (KLOR-CON  M) 20 MEQ tablet Take 1 tablet (20 mEq total) by mouth daily. 30 tablet 0   rosuvastatin  (CRESTOR ) 10 MG tablet Take 1 tablet (10 mg total) by mouth at bedtime. 30 tablet 0   sildenafil  (REVATIO ) 20 MG tablet Take 1 tablet (20 mg total) by mouth 3 (three) times daily. 90 tablet 0   warfarin (COUMADIN ) 2 MG tablet Take two tablets (4 mg) on Monday, Wednesday, Friday and one tablet (2 mg) all other days. 40 tablet 11   polyethylene glycol (MIRALAX  / GLYCOLAX ) 17 g packet Take 17 g by mouth daily as needed for moderate constipation. (Patient not taking: Reported on 02/28/2024)     No current facility-administered medications for this encounter.    Mushroom extract complex (obsolete), Doxycycline , Neosporin [neomycin-bacitracin zn-polymyx], and Tape  REVIEW OF SYSTEMS: All systems negative except as listed in HPI, PMH and Problem list.  LVAD INTERROGATION:  See LVAD nurse's note from today.  I reviewed the LVAD parameters,  they are stable.   I reviewed the LVAD parameters from today, and compared the results to the patient's prior recorded data.  No programming changes were made.  The LVAD is functioning within specified parameters.  The patient performs LVAD self-test daily.  LVAD interrogation was negative for any significant power changes, alarms or PI events/speed drops.  LVAD equipment check completed and is in good working order.  Back-up equipment present.   LVAD education done on emergency procedures and precautions and reviewed exit site care.    Vitals:   02/28/24 1122 02/28/24 1123  BP: (!) 94/0 102/73  Pulse:  71  SpO2:  99%  Weight:  80.9 kg (178 lb 6.4 oz)  Height:  5\' 9"  (1.753 m)  MAP 71  Physical Exam: General: Well appearing this am. NAD.  HEENT: Normal. Neck: Supple, JVP 7-8 cm. Carotids OK.  Cardiac:  Mechanical heart sounds with LVAD hum present.  Lungs:  CTAB, normal effort.  Abdomen:  NT, ND, no HSM. No bruits or masses. +BS  LVAD exit site: Well-healed  and incorporated. Dressing dry and intact. No erythema or drainage. Stabilization device present and accurately applied. Driveline dressing changed daily per sterile technique. Extremities:  Warm and dry. No cyanosis, clubbing, rash, or edema.  Neuro:  Alert & oriented x 3. Cranial nerves grossly intact. Moves all 4 extremities w/o difficulty. Affect pleasant    ASSESSMENT AND PLAN: 1. Chronic systolic CHF:  Long-standing cardiomyopathy.  [employer] dual chamber ICD.  Echo in 12/22 with EF < 20%, severe LV dilation, restrictive diastolic function, moderate RV dysfunction, moderate MR, mod-severe TR. Cause of cardiomyopathy is uncertain.  He has a history of CAD, but I do not think that the described CAD from the past could explain his cardiomyopathy, but CAD could have progressed.  He was admitted in 12/22 with cardiogenic shock and Impella 5.5. s/p HM III VAD on 09/17/22.  Post-op course complicated by AKI and RV failure. He was on HD for several months but is now off.  Echo in 7/24 showed EF < 20%, IV septum midline to slightly leftward, RV moderately dilated with moderately decreased systolic function, IVC dilated with severe TR.  Echo in 5/25 with EF 20-25%, mild LVH, LVAD present, mild RV dysfunction, moderate AI, mild RV dysfunction with normal RV size, severe TR.  NYHA class II, not volume overloaded on exam.  Last creatinine was 1.97. LVAD parameters reviewed and stable, no changes.  - Continue Lasix  40 mg daily.  BMET today.  - Continue sildenafil  20 tid for RV.   - Warfarin for INR 2.5-3.    2. Tricuspid regurgitation:  Tricuspid repair not done at time of VAD due to proximity of ICD wires and hypotension during surgery.  He has severe TR.  3. Atrial fibrillation: Paroxysmal.  He is off amiodarone  with resolution of nausea.  I will not restart it.  He is a-paced today, was in AF last hospitalization.  4. CKD stage IV: Suspect intra-op hypotension at the time of LVAD placement led to  development of ATN => urine sediment looked like ATN per renal. Had initial CVVH and transitioned over to HD. He is now off HD. Creatinine was 1.97 most recently.  - BMET today. Charlies Contes with nephrology.  5. CAD: History of PCI to OM1 in 2007 and RCA in 2013.  No CP or ACs.  - Deferred cardiac cath in 12/22 due to AKI and plan for VAD - Continue Crestor .  6. Abnormal thyroid  function tests: Patient has had elevated TSH and free T4, mixed pattern. We have stopped amiodarone . 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  Most recent TSH was normal.  - He follows with endocrinology.   7. Nausea: This seems to have been due to amiodarone , resolved off amiodarone .  8. CVA: Cardioembolic in setting atrial fibrillation and LVAD.  No LVAD alarms.  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  INR goal increased to 2.5-3.  - Check INR today, he will do weekly on his home device.   Followup in 2 months.   I spent 41 minutes reviewing records, interviewing/examining patient, and managing orders.    Isaac [name] 02/28/2024

## 2024-02-28 NOTE — Progress Notes (Signed)
 Patient presents for hospital discharge follow up with 2.5 year Intermacs with his wife. Denies issues/concerns with VAD equipment or drive line.    Patient ambulated in to clinic independently without difficulty. Denies lightheadedness, dizziness, falls, shortness of breath, and signs of bleeding.   Wife reports patient has improved physically post CVA and is currently going to Neuro Rehab twice weekly.  She reports his cognitive issues remain. He is able to hook up his VAD batteries, but is requiring 24 hr/7 day week supervision. He was unable to comprehend and complete his Intermacs questions, Neurocognitive, and says he cannot perform 6 minute walk at this visit. Dr. Mitzie Anda encouraged patient to slowly increase his walking.   Vital Signs:  Doppler Pressure: 94 Automatic BP: 102/73 (96) HR: 71 SPO2: 99 on 2 liters O2   Weight: 178.4 lb w/ eqt Last weight: 184 lb w/eqt   VAD Indication: Destination Therapy due to age and kidney disease   LVAD assessment:  HM III: Speed: 5300 rpms Flow: 4.4 Power: 4.0 w    PI: 3.4 Alarms: none Events: 5 - 10 PI events daily  Fixed speed: 5300 Low speed limit: 5000  Primary controller: back up battery due for replacement in 9 months Secondary controller:  back up battery due for replacement in 4 months. Back up battery replaced with SN [id number]; exp 12/02/25; replace in 34 months   I reviewed the LVAD parameters from today and compared the results to the patient's prior recorded data. LVAD interrogation was NEGATIVE for significant power changes, NEGATIVE for clinical alarms and STABLE for PI events/speed drops. No programming changes were made and pump is functioning within specified parameters. Pt is performing daily controller and system monitor self tests along with completing weekly and monthly maintenance for LVAD equipment.   LVAD equipment check completed and is in good working order. Back-up equipment present. Charged back up battery and  performed self-test on equipment.   Unable to complete 2.5 year Intermacs or 6 minute walk today due to patients's recent stroke - see note above.    Back up controller: Back up battery changed and 11V backup battery charged during this visit.  Patient Goals: improve cognitive abilities.    Exit Site Care: Wife has been performing weekly dressing changes using weekly dressings. Drive line anchor correctly applied.  Pt given 8 weekly kits, 3 boxes of large tegaderms, and 4 anchors.  Device:  [employer] dual  Therapies: on Pacing: VVI 50 Last check: 02/09/24   BP & Labs:  Doppler 94 - Doppler is reflecting MAP   Hgb 11.9 - No S/S of bleeding. Specifically denies melena/BRBPR or nosebleeds.   LDH stable at 175 with established baseline of 200 - 300. Denies tea-colored urine. No power elevations noted on interrogation.   Patient Instructions:  No medication changes today. Return to VAD Clinic in 2 months - see below. Please call us  if any questions or concerns prior to your next visit.    Wynetta Heckle RN,BSN VAD Coordinator  Office: [phone]  24/7 Pager: [phone]

## 2024-02-28 NOTE — Patient Instructions (Signed)
 No medication changes today. Return to VAD Clinic in 2 months - see below. Please call us  if any questions or concerns prior to your next visit.

## 2024-02-29 ENCOUNTER — Ambulatory Visit: Admitting: Speech Pathology

## 2024-02-29 ENCOUNTER — Encounter: Admitting: Speech Pathology

## 2024-02-29 ENCOUNTER — Encounter: Payer: Self-pay | Admitting: Physical Therapy

## 2024-02-29 ENCOUNTER — Ambulatory Visit: Admitting: Occupational Therapy

## 2024-02-29 ENCOUNTER — Other Ambulatory Visit: Payer: Self-pay

## 2024-02-29 ENCOUNTER — Ambulatory Visit: Admitting: Physical Therapy

## 2024-02-29 ENCOUNTER — Ambulatory Visit

## 2024-02-29 DIAGNOSIS — Z95811 Presence of heart assist device: Secondary | ICD-10-CM

## 2024-02-29 DIAGNOSIS — R2689 Other abnormalities of gait and mobility: Secondary | ICD-10-CM

## 2024-02-29 DIAGNOSIS — R278 Other lack of coordination: Secondary | ICD-10-CM

## 2024-02-29 DIAGNOSIS — M6281 Muscle weakness (generalized): Secondary | ICD-10-CM

## 2024-02-29 DIAGNOSIS — R269 Unspecified abnormalities of gait and mobility: Secondary | ICD-10-CM

## 2024-02-29 DIAGNOSIS — R41841 Cognitive communication deficit: Secondary | ICD-10-CM

## 2024-02-29 DIAGNOSIS — I639 Cerebral infarction, unspecified: Secondary | ICD-10-CM

## 2024-02-29 DIAGNOSIS — R7989 Other specified abnormal findings of blood chemistry: Secondary | ICD-10-CM

## 2024-02-29 NOTE — Therapy (Signed)
 OUTPATIENT SPEECH LANGUAGE PATHOLOGY  COGNITIVE COMMUNICATION TREATMENT NOTE   Patient Name: Isaac [name] MRN: [id number] DOB:1951-03-14, 73 y.o., male Today's Date: 02/29/2024  PCP: Lyle San, MD REFERRING PROVIDER: Georjean Kite, PA   End of Session - 02/29/24 1233     Visit Number 2    Number of Visits 25    Date for SLP Re-Evaluation 05/17/24    Authorization Type [employer] HMO/PPO    Progress Note Due on Visit 10    SLP Start Time 1015    SLP Stop Time  1100    SLP Time Calculation (min) 45 min    Activity Tolerance Patient tolerated treatment well             Past Medical History:  Diagnosis Date   Arrhythmia    atrial fibrillation   CHF (congestive heart failure) (HCC)    Chronic kidney disease    Coronary artery disease    Hyperlipidemia    Hypertension    Myocardial infarct [facility])    Past Surgical History:  Procedure Laterality Date   CARDIAC DEFIBRILLATOR PLACEMENT  feb 2014   ICD GENERATOR CHANGEOUT N/A 02/09/2024   Procedure: ICD GENERATOR CHANGEOUT;  Surgeon: Tammie Fall, MD;  Location: [facility] INVASIVE CV LAB;  Service: Cardiovascular;  Laterality: N/A;   INSERTION OF IMPLANTABLE LEFT VENTRICULAR ASSIST DEVICE N/A 09/17/2021   Procedure: INSERTION OF IMPLANTABLE LEFT VENTRICULAR ASSIST DEVICE AND INSERTION OF FEMORAL ARTERIAL LINE;  Surgeon: Bartley Lightning, MD;  Location: [facility] OR;  Service: Open Heart Surgery;  Laterality: N/A;   IR FLUORO GUIDE CV LINE RIGHT  09/27/2021   IR THORACENTESIS ASP PLEURAL SPACE W/IMG GUIDE  09/27/2021   IR US  GUIDE VASC ACCESS RIGHT  09/27/2021   PLACEMENT OF IMPELLA LEFT VENTRICULAR ASSIST DEVICE N/A 09/12/2021   Procedure: PLACEMENT OF IMPELLA 5.5 LEFT VENTRICULAR ASSIST DEVICE;  Surgeon: Zelphia Higashi, MD;  Location: [facility] OR;  Service: Open Heart Surgery;  Laterality: N/A;   RIGHT HEART CATH N/A 09/08/2021   Procedure: RIGHT HEART CATH;  Surgeon: Darlis Eisenmenger, MD;  Location: [facility] INVASIVE CV LAB;   Service: Cardiovascular;  Laterality: N/A;   TEE WITHOUT CARDIOVERSION N/A 09/12/2021   Procedure: TRANSESOPHAGEAL ECHOCARDIOGRAM (TEE);  Surgeon: Zelphia Higashi, MD;  Location: [facility] OR;  Service: Open Heart Surgery;  Laterality: N/A;   TEE WITHOUT CARDIOVERSION N/A 09/17/2021   Procedure: TRANSESOPHAGEAL ECHOCARDIOGRAM (TEE);  Surgeon: Bartley Lightning, MD;  Location: [facility] OR;  Service: Open Heart Surgery;  Laterality: N/A;   Patient Active Problem List   Diagnosis Date Noted   Hematoma 02/17/2024   Anemia 02/15/2024   Hypotension 02/15/2024   Sundowning 02/11/2024   Insomnia 02/11/2024   Constipation 02/11/2024   Stage 3b chronic kidney disease (HCC) 02/11/2024   Cardioembolic stroke (HCC) 02/10/2024   Acute left PCA stroke (HCC) 02/05/2024   Abnormal thyroid  blood test 04/05/2023   Abscess of left groin 11/27/2022   VT (ventricular tachycardia) (HCC) 10/27/2022   ICD (implantable cardioverter-defibrillator) in place 10/27/2022   Pressure injury of skin 10/07/2021   LVAD (left ventricular assist device) present (HCC)    Protein-calorie malnutrition, severe 09/10/2021   Elevated TSH    PICC (peripherally inserted central catheter) in place    Cardiogenic shock (HCC)    Syncope 09/07/2021   CHF (congestive heart failure) (HCC) 09/06/2021   CAD (coronary artery disease) 08/22/2021   Cardiomyopathy (HCC) 08/22/2021   Mixed hyperlipidemia 08/22/2021   A-fib (HCC) 08/22/2021  Elevated troponin 08/22/2021   Acute on chronic combined systolic and diastolic CHF (congestive heart failure) (HCC) 08/22/2021   Benign essential hypertension 02/22/2015   Chronic systolic heart failure (HCC) 07/05/2014    ONSET DATE: 02/05/2024; date of referral  02/22/2024  REFERRING DIAG: W09.811 (ICD-10-CM) - Cerebral infarction due to unspecified occlusion or stenosis of left posterior cerebral artery  THERAPY DIAG:  Cognitive communication deficit  Rationale for Evaluation and Treatment  Rehabilitation  SUBJECTIVE:   PERTINENT HISTORY and DIAGNOSTIC FINDINGS:  [name] is a 73 y.o. with history of CAD, HTN, atrial fibrillation, CKD IV, VT and chronic systolic CHFs/p HM-3 LVAD on 09/17/21.  He [facility] to [facility] ED on 02/05/24 for focal neuro deficits with R vision field cut and dysmetria. Code Stroke activated. CT brain positive for acute to subacute appearing Left PCA and Left cerebellar infarcts. Hyperdense Left PCA suggests occlusion of that vessel. No hemorrhagic transformation or mass effect. Seen by Neuro and decision made to treat with IV heparin . Echocardiogram shows EF 25 to 30% with LVAD at the apex.  Left atrial is moderately dilate. CT angio distal left P2/P3 occlusion.Repeat CT head 02/06/24 with evolving left cerebellar, left PCA and right frontal gyrus MCA branch infarct. No hemorrhage.  Neurology feels infarcts 2/2 LVAD and signed off  Pt attended [facility] CIR from until 02/18/2024, received ST services targeting cognitive linguistic abilities and discharged at MOD A for cognitive linguistic skills. "Memory/orientation deficits remain the pt's biggest barrier to progress and overall safety."   PAIN:  Are you having pain? No   FALLS: Has patient fallen in last 6 months?  No  LIVING ENVIRONMENT: Lives with: lives with their spouse Lives in: House/apartment  PLOF:  Level of assistance: Independent with ADLs, Independent with IADLs Employment: Retired   PATIENT GOALS   to improve functional independence  SUBJECTIVE STATEMENT: Pt pleasant, continued good effort towards all tasks Pt accompanied by: significant other  OBJECTIVE:  TODAY'S TREATMENT:  Skilled treatment session focused on pt's cognitive communication goals. SLP facilitated session by providing the following interventions:   The Repeatable Battery for the Assessment of Neuropsychological Status Update (RBANS-Update) is a brief, individually administered test measuring  attention, language, visuospatial/constructional abilities, and immediate and delayed memory. The test comprises 12 subtests that are intended for use with adolescents to adults, ages 53 to 69 years. Normative scores were developed using a stratified, nationally representative sample of 690 healthy adolescents and adults. RBANS yields index standard scores based on subtest raw scores. RBANS index scores are metrically scaled, with a mean of 100 and a standard deviation of 15 for each age group.  Version A    Immediate Memory Index Score: 49: Extremely Low (Index Score 69 and below)  Total Domain Score List Learning 12/40 Story Memory 4/24  Visuospatial/Constructional Index Score: 53: Extremely Low (Index Score 69 and below)  Total Domain Score Figure Copy 3/20 Line Orientation 6/20   Language Index Score: 85: Low Average (Index Score 80-89)  Total Domain Score Picture Naming 10/10 Semantic Fluency 11/40   Attention Index Score: 68: Extremely Low (Index Score 69 and below)  Total Domain Score Digit Span 8/16  Coding 17/89   Delayed Memory Index Score: 40: Extremely Low (Index Score 69 and below)  Total Domain Score List Recall 0/10 List Recognition 9/20  Story Recall 0/12 Figure Recall 0/20   Total Scale is 51   Education provided on potential baseline (post LVAD) memory loss prior to recent CVA  d/t heart failure. Pt's wife states that pt made some bread using bread machine with slight mistake made with quantity but overall good results.   PATIENT REPORTED OUTCOME MEASURES (PROM): To be completed at the next treatment session    PATIENT EDUCATION: Education details: results of this assessment, ST POC Person educated: Patient and Spouse Education method: Explanation Education comprehension: verbalized understanding   HOME EXERCISE PROGRAM:   N/A   GOALS:  Goals reviewed with patient? Yes  SHORT TERM GOALS: Target date: 10 sessions  With Mod A, pt will  utilize external memory aids to answer questions regarding orientation information with 80% accuracy.  Baseline: Goal status: INITIAL  2.  With Mod A, pt will improve word finding by using strategies to produce generative naming list of equipment and vocabulary related to LVAD.  Baseline:  Goal status: INITIAL  3.  Pt will complete addition cognitive assessment to establish current level of ability.  Baseline: Pt discharged from CIR requiring Mod A for basic cognitive function.  Goal status: INITIAL   LONG TERM GOALS: Target date: 05/17/2024  With supervision A, pt will attend to items on his right to improve safety awareness and accuracy of task completion in 5 out of 7 opportunities.  Baseline:  Goal status: INITIAL  2.  With Min A, pt will external and internal memory aides to help short term and prospective memory in 5 out of 7 opportunities.  Baseline:  Goal status: INITIAL  3.  With supervision A, pt will be able to verbally sequence (using appropriate vocabulary) LVAD features, alerts and switching battery sources.  Baseline:  Goal status: INITIAL   ASSESSMENT:  CLINICAL IMPRESSION: Patient is a 73 y.o. male LVAD patient, who was seen today for a cognitive communication treatment d/t left cerebellar, left PCA and right frontal gyrus MCA branch infarct. Pt presents with overall adequate expressive and receptive communication as evidenced by pt's performance on the Western Aphasia Battery (see above- score reduced by severe inability to name more than 4 animals within 1 minute).    Today's cognitive assessment reveals severe cognitive impairment that is c/b by severe deficits in attention (pt humming, singing softly to himself during tasks likely provided distracting), awareness (pt is aware of difficulty/poor performance within tasks but unaware of overall impairment and impact on safety), profound memory deficits in immediate recall, short term memory, retrieval, visual,  auditory and prospective memory decreased awareness of confabulatory responses and severe deficits in executive function as observed during visuospatial tasks, specifically task planning, organization, sequencing with pt losing points d/t slow task completion.   See the above treatment note for details.     OBJECTIVE IMPAIRMENTS include attention, memory, awareness, and executive functioning. These impairments are limiting patient from managing medications, managing appointments, managing finances, household responsibilities, and ADLs/IADLs. Factors affecting potential to achieve goals and functional outcome are ability to learn/carryover information, co-morbidities, medical prognosis, previous level of function, and severity of impairments. Patient will benefit from skilled SLP services to address above impairments and improve overall function.  REHAB POTENTIAL: Good  PLAN: SLP FREQUENCY: 1-2x/week  SLP DURATION: 12 weeks  PLANNED INTERVENTIONS: Language facilitation, Environmental controls, Cueing hierachy, Cognitive reorganization, Internal/external aids, Functional tasks, SLP instruction and feedback, Compensatory strategies, and Patient/family education   Manna Gose B. Garlin Junker, M.S., CCC-SLP, Tree surgeon Certified Brain Injury Specialist [facility]  [facility] Rehabilitation Services Office [phone] Ascom [phone] Fax [phone]

## 2024-02-29 NOTE — Therapy (Signed)
 OUTPATIENT PHYSICAL THERAPY NEURO TREATMENT    Patient Name: Isaac [name] MRN: [id number] DOB:06-16-1951, 73 y.o., male Today's Date: 02/29/2024   PCP: Lyle San, MD  REFERRING PROVIDER: Sterling Eisenmenger, PA-C   END OF SESSION:  PT End of Session - 02/29/24 1053     Visit Number 2    Number of Visits 24    Date for PT Re-Evaluation 05/17/24    PT Start Time 1101    PT Stop Time 1145    PT Time Calculation (min) 44 min    Equipment Utilized During Treatment Gait belt    Activity Tolerance Patient tolerated treatment well    Behavior During Therapy WFL for tasks assessed/performed             Past Medical History:  Diagnosis Date   Arrhythmia    atrial fibrillation   CHF (congestive heart failure) (HCC)    Chronic kidney disease    Coronary artery disease    Hyperlipidemia    Hypertension    Myocardial infarct [facility])    Past Surgical History:  Procedure Laterality Date   CARDIAC DEFIBRILLATOR PLACEMENT  feb 2014   ICD GENERATOR CHANGEOUT N/A 02/09/2024   Procedure: ICD GENERATOR CHANGEOUT;  Surgeon: Tammie Fall, MD;  Location: [facility] INVASIVE CV LAB;  Service: Cardiovascular;  Laterality: N/A;   INSERTION OF IMPLANTABLE LEFT VENTRICULAR ASSIST DEVICE N/A 09/17/2021   Procedure: INSERTION OF IMPLANTABLE LEFT VENTRICULAR ASSIST DEVICE AND INSERTION OF FEMORAL ARTERIAL LINE;  Surgeon: Bartley Lightning, MD;  Location: [facility] OR;  Service: Open Heart Surgery;  Laterality: N/A;   IR FLUORO GUIDE CV LINE RIGHT  09/27/2021   IR THORACENTESIS ASP PLEURAL SPACE W/IMG GUIDE  09/27/2021   IR US  GUIDE VASC ACCESS RIGHT  09/27/2021   PLACEMENT OF IMPELLA LEFT VENTRICULAR ASSIST DEVICE N/A 09/12/2021   Procedure: PLACEMENT OF IMPELLA 5.5 LEFT VENTRICULAR ASSIST DEVICE;  Surgeon: Zelphia Higashi, MD;  Location[facility] OR;  Service: Open Heart Surgery;  Laterality: N/A;   RIGHT HEART CATH N/A 09/08/2021   Procedure: RIGHT HEART CATH;  Surgeon: Darlis Eisenmenger, MD;  Location:  [facility] INVASIVE CV LAB;  Service: Cardiovascular;  Laterality: N/A;   TEE WITHOUT CARDIOVERSION N/A 09/12/2021   Procedure: TRANSESOPHAGEAL ECHOCARDIOGRAM (TEE);  Surgeon: Zelphia Higashi, MD;  Location: [facility] OR;  Service: Open Heart Surgery;  Laterality: N/A;   TEE WITHOUT CARDIOVERSION N/A 09/17/2021   Procedure: TRANSESOPHAGEAL ECHOCARDIOGRAM (TEE);  Surgeon: Bartley Lightning, MD;  Location: [facility] OR;  Service: Open Heart Surgery;  Laterality: N/A;   Patient Active Problem List   Diagnosis Date Noted   Hematoma 02/17/2024   Anemia 02/15/2024   Hypotension 02/15/2024   Sundowning 02/11/2024   Insomnia 02/11/2024   Constipation 02/11/2024   Stage 3b chronic kidney disease (HCC) 02/11/2024   Cardioembolic stroke (HCC) 02/10/2024   Acute left PCA stroke (HCC) 02/05/2024   Abnormal thyroid  blood test 04/05/2023   Abscess of left groin 11/27/2022   VT (ventricular tachycardia) (HCC) 10/27/2022   ICD (implantable cardioverter-defibrillator) in place 10/27/2022   Pressure injury of skin 10/07/2021   LVAD (left ventricular assist device) present (HCC)    Protein-calorie malnutrition, severe 09/10/2021   Elevated TSH    PICC (peripherally inserted central catheter) in place    Cardiogenic shock (HCC)    Syncope 09/07/2021   CHF (congestive heart failure) (HCC) 09/06/2021   CAD (coronary artery disease) 08/22/2021   Cardiomyopathy (HCC) 08/22/2021   Mixed hyperlipidemia 08/22/2021  A-fib (HCC) 08/22/2021   Elevated troponin 08/22/2021   Acute on chronic combined systolic and diastolic CHF (congestive heart failure) (HCC) 08/22/2021   Benign essential hypertension 02/22/2015   Chronic systolic heart failure (HCC) 07/05/2014    ONSET DATE: 02/05/24  REFERRING DIAG:  Z61.096 (ICD-10-CM) - Cerebral infarction due to unspecified occlusion or stenosis of left posterior cerebral artery   THERAPY DIAG:  Muscle weakness (generalized)  Other lack of coordination  Cardioembolic stroke  (HCC)  Abnormality of gait  Imbalance  LVAD (left ventricular assist device) present (HCC)  Rationale for Evaluation and Treatment: Rehabilitation  SUBJECTIVE:                                                                                                                                                                                             SUBJECTIVE STATEMENT: Pt reports doing well. Spouse and pt state have not completed HEP yet. Are in process of figuring out a routine to assist in compliance.   Pt accompanied by: significant other  PERTINENT HISTORY:  Presented 02/05/2024 when his pacemaker went off abruptly resulting in a fall as well as noted right sided weakness and aphasia with blurred vision. Cranial CT scan positive for acute to subacute appearing left PCA left cerebellar infarction. Hyperdense left PCA suggesting occlusion of that vessel. He was also diagnosed with right frontal gyrus MCA infarction. No hemorrhagic transformation or mass effect. Underlying advanced chronic small vessel disease. Right supraorbital forehead recent soft tissue injury with no skull fracture identified. CTA head and neck demonstrating subtotal occlusion of the left PCA in the P2/P3 junction and additionally moderate to severe bilateral ICA siphon atherosclerosis with a least moderate siphon stenosis bilaterally.  Admitted to  rehab 02/10/2024 for inpatient therapies to consist of PT, ST and OT at least three hours five days a week. Past admission physiatrist, therapy team and rehab RN have worked together to provide customized collaborative inpatient rehab.  Pertaining to patient's cardioembolic left cerebellar, PCA and right frontal MCA branch infarction secondary to LVAD device despite being on anticoagulation with warfarin.  Patient remained on Coumadin  therapy goal INR 2.5-3.0.  Atrial fibrillation/VT chronic systolic congestive heart failure status post LVAD 09/17/2021.  Continued Revatio  followed by  heart failure team status post generator change 02/09/2024.   PAIN:  Are you having pain? No  PRECAUTIONS: ICD/Pacemaker and Other: LVAD  RED FLAGS: None and LVAD   WEIGHT BEARING RESTRICTIONS: LVAD and pace maker.   FALLS: Has patient fallen in last 6 months? No and pt unsure   LIVING ENVIRONMENT: Lives with: lives with their spouse Lives in: House/apartment Stairs: Yes:  Internal: 9 steps; on right going up Has following equipment at home: Single point cane, Walker - 2 wheeled, and does not use  PLOF: Independent with basic ADLs and Independent with household mobility without device  PATIENT GOALS: pt reports that sight in the L is limited.  Improve balance slightly.   OBJECTIVE:  Note: Objective measures were completed at Evaluation unless otherwise noted.  DIAGNOSTIC FINDINGS:  CT head: IMPRESSION: 1. Expected evolution of Left cerebellar and Left PCA infarcts since yesterday. No hemorrhage or mass effect. 2. Evolution also of a Right MCA middle frontal gyrus infarct predicted by abnormal CTP T-max yesterday. No hemorrhage or mass effect. 3. Consider a recent Embolic event given the recent anterior and posterior vascular territory infarcts.  CT neck  IMPRESSION: 1. CTP does not detect the known Left PCA infarct core (although T-max oligemia corresponds to that finding), while CTA demonstrates subtotal occlusion of the Left PCA at the P2/P3 junction (some distal flow).   2. CTP also suggests Oligemia in the anterior Right MCA division, although no MCA branch occlusion identified by CTA.   3. The above were discussed by telephone with Dr. Janett Medin on 02/05/2024 at 12:36 .   4. Additionally, Moderate to Severe bilateral ICA siphon atherosclerosis with at least Moderate siphon stenosis bilaterally (Moderate to Severe at the Right anterior genu).   5. Extracranial atherosclerosis but no significant Vertebral artery or extracranial carotid stenosis.   6. Aortic  Atherosclerosis (ICD10-I70.0) and Emphysema (ICD10-J43.9).  DG chest FINDINGS: Single frontal view of the chest demonstrates stable left ventricular assist device and multi lead pacer/AICD. Cardiac silhouette remains enlarged. No acute airspace disease, effusion, or pneumothorax. No acute bony abnormalities.     COGNITION: Overall cognitive status: delayed response to history questions    SENSATION: WFL  COORDINATION:   Decreased speed of movement on the RLE>   POSTURE: forward head  LOWER EXTREMITY ROM:     AROM. Grossly WFL.   LOWER EXTREMITY MMT:    MMT Right Eval Left Eval  Hip flexion 4+ 4+  Hip extension    Hip abduction 5 5  Hip adduction 5 5  Hip internal rotation    Hip external rotation    Knee flexion 5 5  Knee extension 5 5  Ankle dorsiflexion 4+ 4+  Ankle plantarflexion    Ankle inversion    Ankle eversion    (Blank rows = not tested)  BED MOBILITY:  Not tested  TRANSFERS: Sit to stand: Complete Independence  Assistive device utilized: None     Stand to sit: Complete Independence  Assistive device utilized: None     Chair to chair: SBA  Assistive device utilized: None       RAMP:  Not tested  CURB:  Findings: CGA 1 UE support   STAIRS: Findings: Level of Assistance: SBA, Stair Negotiation Technique: Step to Pattern with Single Rail on Right Bilateral Rails, Number of Stairs: 8, Height of Stairs: 6   , and Comments: step through ascent step to descent  GAIT: Findings: Gait Characteristics: abducted- Right, Distance walked: 60, Assistive device utilized:None, Level of assistance: SBA and CGA, and Comments: mild adduction on the RLE.   FUNCTIONAL TESTS:  5 times sit to stand: 17.14 Timed up and go (TUG): 13.82 sec no AD 6 minute walk test: to be completed  10 meter walk test: 1.65m/s Berg Balance Scale: To be completed.  Dynamic Gait Index: 18 FGA: 21  PATIENT SURVEYS:  Stroke Impact Scale 71%  TREATMENT DATE: 02/29/2024   Physical Performance:   6 MWT: 1165'    [facility] PT Assessment - 02/29/24 1114       Berg Balance Test   Sit to Stand Able to stand without using hands and stabilize independently    Standing Unsupported Able to stand safely 2 minutes    Sitting with Back Unsupported but Feet Supported on Floor or Stool Able to sit safely and securely 2 minutes    Stand to Sit Sits safely with minimal use of hands    Transfers Able to transfer safely, minor use of hands    Standing Unsupported with Eyes Closed Able to stand 10 seconds safely    Standing Unsupported with Feet Together Able to place feet together independently and stand 1 minute safely    From Standing, Reach Forward with Outstretched Arm Can reach confidently >25 cm (10")    From Standing Position, Pick up Object from Floor Able to pick up shoe safely and easily    From Standing Position, Turn to Look Behind Over each Shoulder Looks behind from both sides and weight shifts well    Turn 360 Degrees Able to turn 360 degrees safely in 4 seconds or less    Standing Unsupported, Alternately Place Feet on Step/Stool Able to complete 4 steps without aid or supervision    Standing Unsupported, One Foot in Front Able to take small step independently and hold 30 seconds    Standing on One Leg Able to lift leg independently and hold > 10 seconds    Total Score 52               There.ex:   Reviewed HEP as listed below       Exercises   - Tandem Stance with Support  - 1 x daily - 4 x weekly - 3 sets - 4 reps - 20 seconds  hold   - Standing Narrow Base of Support with Chair  - 1 x daily - 4 x weekly - 3 sets - 10 reps - 20 seconds.  hold   - Feet Together Balance at [employer] Eyes Closed  - 1 x daily - 4 x weekly - 3 sets - 4 reps - 10seconds hold   - Forward and Backward Walking with Eyes Closed and Counter Support  - 1 x  daily - 4 x weekly - 3 sets - 5 reps    STS with LLE on airex: 2x6   PATIENT EDUCATION: Education details: POC.  Person educated: Patient Education method: Explanation Education comprehension: verbalized understanding  HOME EXERCISE PROGRAM:  Access Code: [email or web address] URL: [email or web address] Date: 02/23/2024 Prepared by: Aurora Lees  Exercises - Tandem Stance with Support  - 1 x daily - 4 x weekly - 3 sets - 4 reps - 20 seconds  hold - Standing Narrow Base of Support with Chair  - 1 x daily - 4 x weekly - 3 sets - 10 reps - 20 seconds.  hold - Feet Together Balance at [employer] Eyes Closed  - 1 x daily - 4 x weekly - 3 sets - 4 reps - 10seconds hold - Forward and Backward Walking with Eyes Closed and Counter Support  - 1 x daily - 4 x weekly - 3 sets - 5 reps  GOALS: Goals reviewed with patient? Yes  SHORT TERM GOALS: Target date: 03/29/2024    Patient will be independent in home exercise program to improve strength/mobility for better functional independence with ADLs. Baseline:  provided on 6/4 Goal status: INITIAL   LONG TERM GOALS: Target date: 05/17/2024    Patient will increase SIS 16 score to equal to or greater than  10 points.    to demonstrate statistically significant improvement in mobility and quality of life.  Baseline: 71% Goal status: INITIAL  2.  Patient (> 65 years old) will complete five times sit to stand test in < 15 seconds indicating an increased LE strength and improved balance. Baseline: 17.14sec Goal status: INITIAL  3.  Patient will increase Berg Balance score by > 6 points to demonstrate decreased fall risk during functional activities Baseline: to be completed  Goal status: INITIAL   4.  Patient will reduce timed up and go to <11 seconds to reduce fall risk and demonstrate improved transfer/gait ability. Baseline: 13.82 seconds  Goal status: INITIAL  5.  Patient will increase FGA  score to >/24 as to demonstrate  reduced fall risk and improved dynamic gait balance for better safety with community/home ambulation.   Baseline: 21 Goal status: INITIAL  6.  Patient will increase 6 min walk test >164ft to demonstrate decreased fall risk during functional activities as well as improved access to community mobility.  Baseline: to be completed ; 02/29/24: 1165' Goal status: INITIAL   ASSESSMENT:  CLINICAL IMPRESSION: Continuing PT POC finishing PT assessment. Pt completing Berg and scores that indicate low falls risk with community walking and standing ADL's. Time spent reviewing HEP and coming up with strategies for maintaining compliance for optimal outcomes. Pt's RLE displays strength/endurance deficits with prolonged activity leading to poor foot clearance. POC will continue to focus on dynamic balance and RLE strengthening to further reduce falls risk. Pt will benefit from skilled PT to allow return to PLOF and increased overall QoL.   OBJECTIVE IMPAIRMENTS: Abnormal gait, cardiopulmonary status limiting activity, decreased activity tolerance, decreased balance, decreased coordination, decreased endurance, decreased mobility, difficulty walking, decreased strength, and improper body mechanics.   ACTIVITY LIMITATIONS: carrying, lifting, squatting, stairs, transfers, locomotion level, and caring for others  PARTICIPATION LIMITATIONS: cleaning, laundry, driving, shopping, community activity, occupation, and yard work  PERSONAL FACTORS: Age and 3+ comorbidities: LVAD, Pacemaker, hypotension  are also affecting patient's functional outcome.   REHAB POTENTIAL: Excellent  CLINICAL DECISION MAKING: Stable/uncomplicated  EVALUATION COMPLEXITY: Low  PLAN:  PT FREQUENCY: 1-2x/week  PT DURATION: 12 weeks  PLANNED INTERVENTIONS: 97164- PT Re-evaluation, 97750- Physical Performance Testing, 97110-Therapeutic exercises, 97530- Therapeutic activity, [id number]- Neuromuscular re-education, 97535- Self Care,  97140- Manual therapy, [phone]- Gait training, Patient/Family education, Balance training, Stair training, Taping, DME instructions, Cryotherapy, and Moist heat  PLAN FOR NEXT SESSION:  RLE strengthening, dynamic balance, balance with reduced BOS, unstable surfaces.    Marc Senior. Fairly IV, PT, DPT Physical Therapist- [place]  [facility]  02/29/2024, 12:35 PM

## 2024-03-01 ENCOUNTER — Ambulatory Visit: Admitting: "Endocrinology

## 2024-03-02 ENCOUNTER — Ambulatory Visit: Admitting: Physical Therapy

## 2024-03-02 ENCOUNTER — Ambulatory Visit: Admitting: Speech Pathology

## 2024-03-02 DIAGNOSIS — R41841 Cognitive communication deficit: Secondary | ICD-10-CM

## 2024-03-02 DIAGNOSIS — R269 Unspecified abnormalities of gait and mobility: Secondary | ICD-10-CM | POA: Diagnosis not present

## 2024-03-02 DIAGNOSIS — Z95811 Presence of heart assist device: Secondary | ICD-10-CM

## 2024-03-02 DIAGNOSIS — I639 Cerebral infarction, unspecified: Secondary | ICD-10-CM

## 2024-03-02 DIAGNOSIS — R278 Other lack of coordination: Secondary | ICD-10-CM

## 2024-03-02 DIAGNOSIS — M6281 Muscle weakness (generalized): Secondary | ICD-10-CM

## 2024-03-02 DIAGNOSIS — R2689 Other abnormalities of gait and mobility: Secondary | ICD-10-CM

## 2024-03-02 NOTE — Therapy (Signed)
 OUTPATIENT PHYSICAL THERAPY NEURO TREATMENT    Patient Name: Isaac [name] MRN: [id number] DOB:1951-04-14, 73 y.o., male Today's Date: 03/02/2024   PCP: Lyle San, MD REFERRING PROVIDER: Sterling Eisenmenger, PA-C   END OF SESSION:  PT End of Session - 03/02/24 0933     Visit Number 3    Number of Visits 24    Date for PT Re-Evaluation 05/17/24    PT Start Time 0933    PT Stop Time 1018    PT Time Calculation (min) 45 min    Equipment Utilized During Treatment Gait belt    Activity Tolerance Patient tolerated treatment well    Behavior During Therapy WFL for tasks assessed/performed           Past Medical History:  Diagnosis Date   Arrhythmia    atrial fibrillation   CHF (congestive heart failure) (HCC)    Chronic kidney disease    Coronary artery disease    Hyperlipidemia    Hypertension    Myocardial infarct [facility])    Past Surgical History:  Procedure Laterality Date   CARDIAC DEFIBRILLATOR PLACEMENT  feb 2014   ICD GENERATOR CHANGEOUT N/A 02/09/2024   Procedure: ICD GENERATOR CHANGEOUT;  Surgeon: Tammie Fall, MD;  Location: [facility] INVASIVE CV LAB;  Service: Cardiovascular;  Laterality: N/A;   INSERTION OF IMPLANTABLE LEFT VENTRICULAR ASSIST DEVICE N/A 09/17/2021   Procedure: INSERTION OF IMPLANTABLE LEFT VENTRICULAR ASSIST DEVICE AND INSERTION OF FEMORAL ARTERIAL LINE;  Surgeon: Bartley Lightning, MD;  Location: [facility] OR;  Service: Open Heart Surgery;  Laterality: N/A;   IR FLUORO GUIDE CV LINE RIGHT  09/27/2021   IR THORACENTESIS ASP PLEURAL SPACE W/IMG GUIDE  09/27/2021   IR US  GUIDE VASC ACCESS RIGHT  09/27/2021   PLACEMENT OF IMPELLA LEFT VENTRICULAR ASSIST DEVICE N/A 09/12/2021   Procedure: PLACEMENT OF IMPELLA 5.5 LEFT VENTRICULAR ASSIST DEVICE;  Surgeon: Zelphia Higashi, MD;  Location: [facility] OR;  Service: Open Heart Surgery;  Laterality: N/A;   RIGHT HEART CATH N/A 09/08/2021   Procedure: RIGHT HEART CATH;  Surgeon: Darlis Eisenmenger, MD;  Location: [facility]  INVASIVE CV LAB;  Service: Cardiovascular;  Laterality: N/A;   TEE WITHOUT CARDIOVERSION N/A 09/12/2021   Procedure: TRANSESOPHAGEAL ECHOCARDIOGRAM (TEE);  Surgeon: Zelphia Higashi, MD;  Location: [facility] OR;  Service: Open Heart Surgery;  Laterality: N/A;   TEE WITHOUT CARDIOVERSION N/A 09/17/2021   Procedure: TRANSESOPHAGEAL ECHOCARDIOGRAM (TEE);  Surgeon: Bartley Lightning, MD;  Location: [facility] OR;  Service: Open Heart Surgery;  Laterality: N/A;   Patient Active Problem List   Diagnosis Date Noted   Hematoma 02/17/2024   Anemia 02/15/2024   Hypotension 02/15/2024   Sundowning 02/11/2024   Insomnia 02/11/2024   Constipation 02/11/2024   Stage 3b chronic kidney disease (HCC) 02/11/2024   Cardioembolic stroke (HCC) 02/10/2024   Acute left PCA stroke (HCC) 02/05/2024   Abnormal thyroid  blood test 04/05/2023   Abscess of left groin 11/27/2022   VT (ventricular tachycardia) (HCC) 10/27/2022   ICD (implantable cardioverter-defibrillator) in place 10/27/2022   Pressure injury of skin 10/07/2021   LVAD (left ventricular assist device) present (HCC)    Protein-calorie malnutrition, severe 09/10/2021   Elevated TSH    PICC (peripherally inserted central catheter) in place    Cardiogenic shock (HCC)    Syncope 09/07/2021   CHF (congestive heart failure) (HCC) 09/06/2021   CAD (coronary artery disease) 08/22/2021   Cardiomyopathy (HCC) 08/22/2021   Mixed hyperlipidemia 08/22/2021   A-fib (HCC)  08/22/2021   Elevated troponin 08/22/2021   Acute on chronic combined systolic and diastolic CHF (congestive heart failure) (HCC) 08/22/2021   Benign essential hypertension 02/22/2015   Chronic systolic heart failure (HCC) 07/05/2014    ONSET DATE: 02/05/24  REFERRING DIAG:  K44.010 (ICD-10-CM) - Cerebral infarction due to unspecified occlusion or stenosis of left posterior cerebral artery   THERAPY DIAG:  Muscle weakness (generalized)  Other lack of coordination  Cardioembolic stroke  (HCC)  Abnormality of gait  Imbalance  LVAD (left ventricular assist device) present (HCC)  Rationale for Evaluation and Treatment: Rehabilitation  SUBJECTIVE:                                                                                                                                                                                             SUBJECTIVE STATEMENT:  Pt and spouse report feeling current HEP is not adequately challenging and pt needs the motivation to exercise at home. Denies pain. Denies stumbles/falls.  Spouse reports pt moves a little more cautiously on unstable surfaces and does still have difficulty with tandem stance.   Pt brings his LVAD battery back-up with him during session.   Pt accompanied by: significant other, [name]  PERTINENT HISTORY:  Presented 02/05/2024 when his pacemaker went off abruptly resulting in a fall as well as noted right sided weakness and aphasia with blurred vision. Cranial CT scan positive for acute to subacute appearing left PCA left cerebellar infarction. Hyperdense left PCA suggesting occlusion of that vessel. He was also diagnosed with right frontal gyrus MCA infarction. No hemorrhagic transformation or mass effect. Underlying advanced chronic small vessel disease. Right supraorbital forehead recent soft tissue injury with no skull fracture identified. CTA head and neck demonstrating subtotal occlusion of the left PCA in the P2/P3 junction and additionally moderate to severe bilateral ICA siphon atherosclerosis with a least moderate siphon stenosis bilaterally.  Admitted to  rehab 02/10/2024 for inpatient therapies to consist of PT, ST and OT at least three hours five days a week. Past admission physiatrist, therapy team and rehab RN have worked together to provide customized collaborative inpatient rehab.  Pertaining to patient's cardioembolic left cerebellar, PCA and right frontal MCA branch infarction secondary to LVAD device despite  being on anticoagulation with warfarin.  Patient remained on Coumadin  therapy goal INR 2.5-3.0.  Atrial fibrillation/VT chronic systolic congestive heart failure status post LVAD 09/17/2021.  Continued Revatio  followed by heart failure team status post generator change 02/09/2024.   PAIN:  Are you having pain? No  PRECAUTIONS: ICD/Pacemaker and Other: LVAD, latex allergy   RED FLAGS: None and LVAD   WEIGHT BEARING  RESTRICTIONS: LVAD and pace maker.   FALLS: Has patient fallen in last 6 months? No and pt unsure   LIVING ENVIRONMENT: Lives with: lives with their spouse Lives in: House/apartment Stairs: Yes: Internal: 9 steps; on right going up Has following equipment at home: Single point cane, Walker - 2 wheeled, and does not use  PLOF: Independent with basic ADLs and Independent with household mobility without device  PATIENT GOALS: pt reports that sight in the L is limited.  Improve balance slightly.   OBJECTIVE:  Note: Objective measures were completed at Evaluation unless otherwise noted.  DIAGNOSTIC FINDINGS:  CT head: IMPRESSION: 1. Expected evolution of Left cerebellar and Left PCA infarcts since yesterday. No hemorrhage or mass effect. 2. Evolution also of a Right MCA middle frontal gyrus infarct predicted by abnormal CTP T-max yesterday. No hemorrhage or mass effect. 3. Consider a recent Embolic event given the recent anterior and posterior vascular territory infarcts.  CT neck  IMPRESSION: 1. CTP does not detect the known Left PCA infarct core (although T-max oligemia corresponds to that finding), while CTA demonstrates subtotal occlusion of the Left PCA at the P2/P3 junction (some distal flow).   2. CTP also suggests Oligemia in the anterior Right MCA division, although no MCA branch occlusion identified by CTA.   3. The above were discussed by telephone with Dr. Janett Medin on 02/05/2024 at 12:36 .   4. Additionally, Moderate to Severe bilateral ICA  siphon atherosclerosis with at least Moderate siphon stenosis bilaterally (Moderate to Severe at the Right anterior genu).   5. Extracranial atherosclerosis but no significant Vertebral artery or extracranial carotid stenosis.   6. Aortic Atherosclerosis (ICD10-I70.0) and Emphysema (ICD10-J43.9).  DG chest FINDINGS: Single frontal view of the chest demonstrates stable left ventricular assist device and multi lead pacer/AICD. Cardiac silhouette remains enlarged. No acute airspace disease, effusion, or pneumothorax. No acute bony abnormalities.     COGNITION: Overall cognitive status: delayed response to history questions    SENSATION: WFL  COORDINATION:   Decreased speed of movement on the RLE>   POSTURE: forward head  LOWER EXTREMITY ROM:     AROM. Grossly WFL.   LOWER EXTREMITY MMT:    MMT Right Eval Left Eval  Hip flexion 4+ 4+  Hip extension    Hip abduction 5 5  Hip adduction 5 5  Hip internal rotation    Hip external rotation    Knee flexion 5 5  Knee extension 5 5  Ankle dorsiflexion 4+ 4+  Ankle plantarflexion    Ankle inversion    Ankle eversion    (Blank rows = not tested)  BED MOBILITY:  Not tested  TRANSFERS: Sit to stand: Complete Independence  Assistive device utilized: None     Stand to sit: Complete Independence  Assistive device utilized: None     Chair to chair: SBA  Assistive device utilized: None       RAMP:  Not tested  CURB:  Findings: CGA 1 UE support   STAIRS: Findings: Level of Assistance: SBA, Stair Negotiation Technique: Step to Pattern with Single Rail on Right Bilateral Rails, Number of Stairs: 8, Height of Stairs: 6   , and Comments: step through ascent step to descent  GAIT: Findings: Gait Characteristics: abducted- Right, Distance walked: 60, Assistive device utilized:None, Level of assistance: SBA and CGA, and Comments: mild adduction on the RLE.   FUNCTIONAL TESTS:  5 times sit to stand: 17.14 Timed up and go  (TUG): 13.82 sec no AD 6  minute walk test: to be completed  10 meter walk test: 1.79m/s Berg Balance Scale: To be completed.  Dynamic Gait Index: 18 FGA: 21  PATIENT SURVEYS:  Stroke Impact Scale 71%                                                                                                                              TREATMENT DATE: 03/02/2024  Unless otherwise stated, CGA was provided and gait belt donned in order to ensure pt safety throughout session.  Gait training 333ft forward and 357ft backwards with CGA/light min assist for steadying during dual-task challenges of visually scanning to find targets on wall and name them for cognitive and speech challenge with head turns - has minor LOB when turning head fully, but able to adjust steps to regain balance without increased assistance.     Dynamic gait training using agility ladder including the following:  forward reciprocal stepping down/back x1 forward zig-zag 2 feet in/2 feet out down/back x2 Require mod/max cuing to learn the sequence, weaned to intermittent cuing forward zig-zag 2 feet in/2 feet out, but skipping a square down/back x3 This was really challenging for pt to learn the motor plan requiring consistent mod/max multimodal cuing Side stepping down/back x2 reps Impaired ability to step R LE laterally fully without stepping on ladder   Static standing NBOS x30 sec and with eyes closed x30 sec - pt demonstrates no postural sway and ability to perform this independently safely and successfully   B LE functional strengthening and dynamic balance challenges as follows:  Lateral side stepping with GTB resistance around knees down/back x3 reps Educated pt on counting his steps and making sure he gets back to the same spot when going towards R to ensure he is taking sufficiently large steps compared to steps towards his L Repeated step-ups at stairs using L UE support on HR Wearing 3lb AWs bilaterally Pt frequently  lacking adequate R foot lift to clear step fully X10 reps  Therapist encouraged pt to drink an adequate amount of water as wife reports concern pt doesn't have enough fluid intake daily.   Therapist educated on importance of walking for endurance training and recommended going to a local mall or box store to walk longer distance indoors without the heat.   Updated pt's HEP as described below with printout provided.   PATIENT EDUCATION: Education details: POC.  Person educated: Patient Education method: Explanation Education comprehension: verbalized understanding  HOME EXERCISE PROGRAM:  Access Code: [email or web address] URL: [email or web address] Date: 03/02/2024 Prepared by: Carlen Chasten  Exercises - Tandem Stance with Support  - 1 x daily - 4 x weekly - 3 sets - 4 reps - 20 seconds  hold - Forward and Backward Walking with Eyes Closed and Counter Support  - 1 x daily - 4 x weekly - 3 sets - 5 reps - Side Stepping with Resistance at Thighs and Counter Support  - 1 x daily - 7  x weekly - 2 sets - 10 reps - Forward Step Up with Unilateral Counter Support  - 1 x daily - 7 x weekly - 1-2 sets - 10 reps - Walking  - 3 x daily - 7 x weekly - 1 sets - 6 minutes hold  GOALS: Goals reviewed with patient? Yes  SHORT TERM GOALS: Target date: 03/29/2024    Patient will be independent in home exercise program to improve strength/mobility for better functional independence with ADLs. Baseline: provided on 6/4 Goal status: INITIAL   LONG TERM GOALS: Target date: 05/17/2024    Patient will increase SIS 16 score to equal to or greater than  10 points.    to demonstrate statistically significant improvement in mobility and quality of life.  Baseline: 71% Goal status: INITIAL  2.  Patient (> 83 years old) will complete five times sit to stand test in < 15 seconds indicating an increased LE strength and improved balance. Baseline: 17.14sec Goal status: INITIAL  3.  Patient will  increase Berg Balance score by > 6 points to demonstrate decreased fall risk during functional activities Baseline: to be completed  Goal status: INITIAL   4.  Patient will reduce timed up and go to <11 seconds to reduce fall risk and demonstrate improved transfer/gait ability. Baseline: 13.82 seconds  Goal status: INITIAL  5.  Patient will increase FGA  score to >/24 as to demonstrate reduced fall risk and improved dynamic gait balance for better safety with community/home ambulation.   Baseline: 21 Goal status: INITIAL  6.  Patient will increase 6 min walk test >179ft to demonstrate decreased fall risk during functional activities as well as improved access to community mobility.  Baseline: to be completed ; 02/29/24: 1165' Goal status: INITIAL   ASSESSMENT:  CLINICAL IMPRESSION:   Therapy session focused on higher level dynamic balance challenges including head turning and sequencing steps through agility ladder. Patient noted to have mild imbalance with full head turns to visually scan environment while ambulating as well as difficulty comprehending and motor planning certain agility ladder sequences. Patient also demonstrates decreased R LE strength with poor ability to lift foot fully to clear step during repeated step-ups. Therapist progressed patient's HEP and updated printout provided based on pt/spouse feedback and pt's presentation in session today.  POC will continue to focus on higher level dynamic balance and gait along with RLE strengthening to further reduce falls risk. Pt will benefit from continued skilled PT to allow return to PLOF and increased overall QoL.    OBJECTIVE IMPAIRMENTS: Abnormal gait, cardiopulmonary status limiting activity, decreased activity tolerance, decreased balance, decreased coordination, decreased endurance, decreased mobility, difficulty walking, decreased strength, and improper body mechanics.   ACTIVITY LIMITATIONS: carrying, lifting, squatting,  stairs, transfers, locomotion level, and caring for others  PARTICIPATION LIMITATIONS: cleaning, laundry, driving, shopping, community activity, occupation, and yard work  PERSONAL FACTORS: Age and 3+ comorbidities: LVAD, Pacemaker, hypotension  are also affecting patient's functional outcome.   REHAB POTENTIAL: Excellent  CLINICAL DECISION MAKING: Stable/uncomplicated  EVALUATION COMPLEXITY: Low  PLAN:  PT FREQUENCY: 1-2x/week  PT DURATION: 12 weeks  PLANNED INTERVENTIONS: [id number]- PT Re-evaluation, 97750- Physical Performance Testing, 97110-Therapeutic exercises, 97530- Therapeutic activity, [id number]- Neuromuscular re-education, 97535- Self Care, 40981- Manual therapy, [id number]- Gait training, Patient/Family education, Balance training, Stair training, Taping, DME instructions, Cryotherapy, and Moist heat  PLAN FOR NEXT SESSION:   - higher level dynamic balance challenges - R LE strengthening - higher level dynamic gait challenges   - include  unstable surfaces, stepping over obstacles, dual-tasks for cognitive/speech challenges - balance with reduced BOS     Carlen Chasten, PT, DPT, NCS, CSRS Physical Therapist - [facility]  [facility]  10:32 AM 03/02/24

## 2024-03-02 NOTE — Therapy (Signed)
 OUTPATIENT SPEECH LANGUAGE PATHOLOGY  COGNITIVE COMMUNICATION TREATMENT NOTE   Patient Name: Isaac [name] MRN: [id number] DOB:04-25-1951, 73 y.o., male Today's Date: 03/02/2024  PCP: Lyle San, MD REFERRING PROVIDER: Georjean Kite, PA   End of Session - 03/02/24 1013     Visit Number 3    Number of Visits 25    Date for SLP Re-Evaluation 05/17/24    Authorization Type [employer] HMO/PPO    Progress Note Due on Visit 10    SLP Start Time 1015    SLP Stop Time  1100    SLP Time Calculation (min) 45 min    Activity Tolerance Patient tolerated treatment well          Past Medical History:  Diagnosis Date   Arrhythmia    atrial fibrillation   CHF (congestive heart failure) (HCC)    Chronic kidney disease    Coronary artery disease    Hyperlipidemia    Hypertension    Myocardial infarct [facility])    Past Surgical History:  Procedure Laterality Date   CARDIAC DEFIBRILLATOR PLACEMENT  feb 2014   ICD GENERATOR CHANGEOUT N/A 02/09/2024   Procedure: ICD GENERATOR CHANGEOUT;  Surgeon: Tammie Fall, MD;  Location: [facility] INVASIVE CV LAB;  Service: Cardiovascular;  Laterality: N/A;   INSERTION OF IMPLANTABLE LEFT VENTRICULAR ASSIST DEVICE N/A 09/17/2021   Procedure: INSERTION OF IMPLANTABLE LEFT VENTRICULAR ASSIST DEVICE AND INSERTION OF FEMORAL ARTERIAL LINE;  Surgeon: Bartley Lightning, MD;  Location: [facility] OR;  Service: Open Heart Surgery;  Laterality: N/A;   IR FLUORO GUIDE CV LINE RIGHT  09/27/2021   IR THORACENTESIS ASP PLEURAL SPACE W/IMG GUIDE  09/27/2021   IR US  GUIDE VASC ACCESS RIGHT  09/27/2021   PLACEMENT OF IMPELLA LEFT VENTRICULAR ASSIST DEVICE N/A 09/12/2021   Procedure: PLACEMENT OF IMPELLA 5.5 LEFT VENTRICULAR ASSIST DEVICE;  Surgeon: Zelphia Higashi, MD;  Location: [facility] OR;  Service: Open Heart Surgery;  Laterality: N/A;   RIGHT HEART CATH N/A 09/08/2021   Procedure: RIGHT HEART CATH;  Surgeon: Darlis Eisenmenger, MD;  Location: [facility] INVASIVE CV LAB;  Service:  Cardiovascular;  Laterality: N/A;   TEE WITHOUT CARDIOVERSION N/A 09/12/2021   Procedure: TRANSESOPHAGEAL ECHOCARDIOGRAM (TEE);  Surgeon: Zelphia Higashi, MD;  Location: [facility] OR;  Service: Open Heart Surgery;  Laterality: N/A;   TEE WITHOUT CARDIOVERSION N/A 09/17/2021   Procedure: TRANSESOPHAGEAL ECHOCARDIOGRAM (TEE);  Surgeon: Bartley Lightning, MD;  Location: [facility] OR;  Service: Open Heart Surgery;  Laterality: N/A;   Patient Active Problem List   Diagnosis Date Noted   Hematoma 02/17/2024   Anemia 02/15/2024   Hypotension 02/15/2024   Sundowning 02/11/2024   Insomnia 02/11/2024   Constipation 02/11/2024   Stage 3b chronic kidney disease (HCC) 02/11/2024   Cardioembolic stroke (HCC) 02/10/2024   Acute left PCA stroke (HCC) 02/05/2024   Abnormal thyroid  blood test 04/05/2023   Abscess of left groin 11/27/2022   VT (ventricular tachycardia) (HCC) 10/27/2022   ICD (implantable cardioverter-defibrillator) in place 10/27/2022   Pressure injury of skin 10/07/2021   LVAD (left ventricular assist device) present (HCC)    Protein-calorie malnutrition, severe 09/10/2021   Elevated TSH    PICC (peripherally inserted central catheter) in place    Cardiogenic shock (HCC)    Syncope 09/07/2021   CHF (congestive heart failure) (HCC) 09/06/2021   CAD (coronary artery disease) 08/22/2021   Cardiomyopathy (HCC) 08/22/2021   Mixed hyperlipidemia 08/22/2021   A-fib (HCC) 08/22/2021   Elevated troponin 08/22/2021  Acute on chronic combined systolic and diastolic CHF (congestive heart failure) (HCC) 08/22/2021   Benign essential hypertension 02/22/2015   Chronic systolic heart failure (HCC) 07/05/2014    ONSET DATE: 02/05/2024; date of referral  02/22/2024  REFERRING DIAG: Z61.096 (ICD-10-CM) - Cerebral infarction due to unspecified occlusion or stenosis of left posterior cerebral artery  THERAPY DIAG:  Cognitive communication deficit  Rationale for Evaluation and Treatment  Rehabilitation  SUBJECTIVE:   PERTINENT HISTORY and DIAGNOSTIC FINDINGS:  Isaac [name] is a 73 y.o. with history of CAD, HTN, atrial fibrillation, CKD IV, VT and chronic systolic CHFs/p HM-3 LVAD on 09/17/21.  He presented to the [facility] ED on 02/05/24 for focal neuro deficits with R vision field cut and dysmetria. Code Stroke activated. CT brain positive for acute to subacute appearing Left PCA and Left cerebellar infarcts. Hyperdense Left PCA suggests occlusion of that vessel. No hemorrhagic transformation or mass effect. Seen by Neuro and decision made to treat with IV heparin . Echocardiogram shows EF 25 to 30% with LVAD at the apex.  Left atrial is moderately dilate. CT angio distal left P2/P3 occlusion.Repeat CT head 02/06/24 with evolving left cerebellar, left PCA and right frontal gyrus MCA branch infarct. No hemorrhage.  Neurology feels infarcts 2/2 LVAD and signed off  Pt attended [facility] CIR from until 02/18/2024, received ST services targeting cognitive linguistic abilities and discharged at MOD A for cognitive linguistic skills. Memory/orientation deficits remain the pt's biggest barrier to progress and overall safety.   PAIN:  Are you having pain? No   FALLS: Has patient fallen in last 6 months?  No  LIVING ENVIRONMENT: Lives with: lives with their spouse Lives in: House/apartment  PLOF:  Level of assistance: Independent with ADLs, Independent with IADLs Employment: Retired   PATIENT GOALS   to improve functional independence  SUBJECTIVE STATEMENT: Pt pleasant, continued good effort towards all tasks Pt accompanied by: significant other  OBJECTIVE:  TODAY'S TREATMENT:  Skilled treatment session focused on pt's cognitive communication goals. SLP facilitated session by providing the following interventions:   Memory deficits targeted recall of vocabulary related to LVAD. Pt independent with recall of components and function of each.   SLP provided  recommendation to potentially use alarm feature on cell phone to aid in prospective memory. Pt reports that he is no longer able to use his cell phone d/t cognitive deficits. SLP provided instruction in setting a daily alarm at 1000am labeled Physical Therapy Exercise to help pt remind to perform PT activities. Pt to provide feedback on use of alarm as well as additional activities that he might use alarm for.   SLP created patient account on [employer] Therapy App with username and password provided to pt.  Memory targeted thru  Picture Recall Level 3: 70% improving to 95% with min A, in addition pt benefited from Moderate faded to Min A (with more than a reasonable amount of time) to problem solve mechanics of app (remembering to touch the icons/pictures) General Memory Level 2: 100% but great use of scanning to improve accuracy Sentence Detail Level 1: 90% - again improved independence use of scanning  PATIENT REPORTED OUTCOME MEASURES (PROM): To be completed at the next treatment session    PATIENT EDUCATION: Education details: see above Person educated: Patient and Spouse Education method: Explanation Education comprehension: verbalized understanding   HOME EXERCISE PROGRAM:   Set alarms on phone [employer] Therapy, see above   GOALS:  Goals reviewed with patient? Yes  SHORT TERM GOALS:  Target date: 10 sessions  With Mod A, pt will utilize external memory aids to answer questions regarding orientation information with 80% accuracy.  Baseline: Goal status: INITIAL  2.  With Mod A, pt will improve word finding by using strategies to produce generative naming list of equipment and vocabulary related to LVAD.  Baseline:  Goal status: INITIAL  3.  Pt will complete addition cognitive assessment to establish current level of ability.  Baseline: Pt discharged from CIR requiring Mod A for basic cognitive function.  Goal status: INITIAL   LONG TERM GOALS: Target date:  05/17/2024  With supervision A, pt will attend to items on his right to improve safety awareness and accuracy of task completion in 5 out of 7 opportunities.  Baseline:  Goal status: INITIAL  2.  With Min A, pt will external and internal memory aides to help short term and prospective memory in 5 out of 7 opportunities.  Baseline:  Goal status: INITIAL  3.  With supervision A, pt will be able to verbally sequence (using appropriate vocabulary) LVAD features, alerts and switching battery sources.  Baseline:  Goal status: INITIAL   ASSESSMENT:  CLINICAL IMPRESSION: Patient is a 73 y.o. male LVAD patient, who was seen today for a cognitive communication treatment d/t left cerebellar, left PCA and right frontal gyrus MCA branch infarct. Pt presents with severe cognitive impairment that is c/b by severe deficits in attention, awareness (pt is aware of difficulty/poor performance within tasks but unaware of overall impairment and impact on safety), profound memory deficits in immediate recall, short term memory, retrieval, visual, auditory and prospective memory decreased awareness of confabulatory responses and severe deficits in executive function as observed during visuospatial tasks, specifically task planning, organization, sequencing with pt losing points d/t slow task completion. overall adequate expressive and receptive communication    Pt continues to be eager and puts forth great effort. See the above treatment note for details.     OBJECTIVE IMPAIRMENTS include attention, memory, awareness, and executive functioning. These impairments are limiting patient from managing medications, managing appointments, managing finances, household responsibilities, and ADLs/IADLs. Factors affecting potential to achieve goals and functional outcome are ability to learn/carryover information, co-morbidities, medical prognosis, previous level of function, and severity of impairments. Patient will benefit  from skilled SLP services to address above impairments and improve overall function.  REHAB POTENTIAL: Good  PLAN: SLP FREQUENCY: 1-2x/week  SLP DURATION: 12 weeks  PLANNED INTERVENTIONS: Language facilitation, Environmental controls, Cueing hierachy, Cognitive reorganization, Internal/external aids, Functional tasks, SLP instruction and feedback, Compensatory strategies, and Patient/family education   Natalya Domzalski B. Garlin Junker, M.S., CCC-SLP, Tree surgeon Certified Brain Injury Specialist [facility]  [facility] Office [phone] Ascom [phone] Fax [phone]

## 2024-03-06 ENCOUNTER — Ambulatory Visit: Admitting: Speech Pathology

## 2024-03-06 ENCOUNTER — Ambulatory Visit (HOSPITAL_COMMUNITY): Payer: Self-pay | Admitting: Pharmacist

## 2024-03-06 ENCOUNTER — Ambulatory Visit: Admitting: Physical Therapy

## 2024-03-06 ENCOUNTER — Encounter

## 2024-03-06 DIAGNOSIS — I639 Cerebral infarction, unspecified: Secondary | ICD-10-CM

## 2024-03-06 DIAGNOSIS — M6281 Muscle weakness (generalized): Secondary | ICD-10-CM

## 2024-03-06 DIAGNOSIS — Z95811 Presence of heart assist device: Secondary | ICD-10-CM

## 2024-03-06 DIAGNOSIS — R269 Unspecified abnormalities of gait and mobility: Secondary | ICD-10-CM | POA: Diagnosis not present

## 2024-03-06 DIAGNOSIS — R41841 Cognitive communication deficit: Secondary | ICD-10-CM

## 2024-03-06 DIAGNOSIS — R2689 Other abnormalities of gait and mobility: Secondary | ICD-10-CM

## 2024-03-06 DIAGNOSIS — R278 Other lack of coordination: Secondary | ICD-10-CM

## 2024-03-06 LAB — POCT INR: INR: 2.2 (ref 2.0–3.0)

## 2024-03-06 NOTE — Patient Instructions (Signed)
 I added a few activities to the Talk[employer] Therapy app. I recommend doing these 30 minutes a day, every day.

## 2024-03-06 NOTE — Therapy (Signed)
 OUTPATIENT PHYSICAL THERAPY NEURO TREATMENT    Patient Name: Isaac [name] MRN: [id number] DOB:[birth year], 73 y.o., male Today's Date: 03/06/2024   PCP: Lyle San, MD REFERRING PROVIDER: Sterling Eisenmenger, PA-C   END OF SESSION:   PT End of Session - 03/06/24 1405     Visit Number 4    Number of Visits 24    Date for PT Re-Evaluation 05/17/24    PT Start Time 1404    PT Stop Time 1445    PT Time Calculation (min) 41 min    Equipment Utilized During Treatment Gait belt    Activity Tolerance Patient tolerated treatment well    Behavior During Therapy WFL for tasks assessed/performed            Past Medical History:  Diagnosis Date   Arrhythmia    atrial fibrillation   CHF (congestive heart failure) (HCC)    Chronic kidney disease    Coronary artery disease    Hyperlipidemia    Hypertension    Myocardial infarct [facility])    Past Surgical History:  Procedure Laterality Date   CARDIAC DEFIBRILLATOR PLACEMENT  feb 2014   ICD GENERATOR CHANGEOUT N/A 02/09/2024   Procedure: ICD GENERATOR CHANGEOUT;  Surgeon: Tammie Fall, MD;  Location: [facility] INVASIVE CV LAB;  Service: Cardiovascular;  Laterality: N/A;   INSERTION OF IMPLANTABLE LEFT VENTRICULAR ASSIST DEVICE N/A 09/17/2021   Procedure: INSERTION OF IMPLANTABLE LEFT VENTRICULAR ASSIST DEVICE AND INSERTION OF FEMORAL ARTERIAL LINE;  Surgeon: Bartley Lightning, MD;  Location: [facility] OR;  Service: Open Heart Surgery;  Laterality: N/A;   IR FLUORO GUIDE CV LINE RIGHT  09/27/2021   IR THORACENTESIS ASP PLEURAL SPACE W/IMG GUIDE  09/27/2021   IR US  GUIDE VASC ACCESS RIGHT  09/27/2021   PLACEMENT OF IMPELLA LEFT VENTRICULAR ASSIST DEVICE N/A 09/12/2021   Procedure: PLACEMENT OF IMPELLA 5.5 LEFT VENTRICULAR ASSIST DEVICE;  Surgeon: Zelphia Higashi, MD;  Location: [facility] OR;  Service: Open Heart Surgery;  Laterality: N/A;   RIGHT HEART CATH N/A 09/08/2021   Procedure: RIGHT HEART CATH;  Surgeon: Darlis Eisenmenger, MD;  Location: [facility]  INVASIVE CV LAB;  Service: Cardiovascular;  Laterality: N/A;   TEE WITHOUT CARDIOVERSION N/A 09/12/2021   Procedure: TRANSESOPHAGEAL ECHOCARDIOGRAM (TEE);  Surgeon: Zelphia Higashi, MD;  Location: [facility] OR;  Service: Open Heart Surgery;  Laterality: N/A;   TEE WITHOUT CARDIOVERSION N/A 09/17/2021   Procedure: TRANSESOPHAGEAL ECHOCARDIOGRAM (TEE);  Surgeon: Bartley Lightning, MD;  Location: [facility] OR;  Service: Open Heart Surgery;  Laterality: N/A;   Patient Active Problem List   Diagnosis Date Noted   Hematoma 02/17/2024   Anemia 02/15/2024   Hypotension 02/15/2024   Sundowning 02/11/2024   Insomnia 02/11/2024   Constipation 02/11/2024   Stage 3b chronic kidney disease (HCC) 02/11/2024   Cardioembolic stroke (HCC) 02/10/2024   Acute left PCA stroke (HCC) 02/05/2024   Abnormal thyroid  blood test 04/05/2023   Abscess of left groin 11/27/2022   VT (ventricular tachycardia) (HCC) 10/27/2022   ICD (implantable cardioverter-defibrillator) in place 10/27/2022   Pressure injury of skin 10/07/2021   LVAD (left ventricular assist device) present (HCC)    Protein-calorie malnutrition, severe 09/10/2021   Elevated TSH    PICC (peripherally inserted central catheter) in place    Cardiogenic shock (HCC)    Syncope 09/07/2021   CHF (congestive heart failure) (HCC) 09/06/2021   CAD (coronary artery disease) 08/22/2021   Cardiomyopathy (HCC) 08/22/2021   Mixed hyperlipidemia 08/22/2021  A-fib (HCC) 08/22/2021   Elevated troponin 08/22/2021   Acute on chronic combined systolic and diastolic CHF (congestive heart failure) (HCC) 08/22/2021   Benign essential hypertension 02/22/2015   Chronic systolic heart failure (HCC) 07/05/2014    ONSET DATE: 02/05/24  REFERRING DIAG:  Z61.096 (ICD-10-CM) - Cerebral infarction due to unspecified occlusion or stenosis of left posterior cerebral artery   THERAPY DIAG:  Muscle weakness (generalized)  Other lack of coordination  Cardioembolic stroke  (HCC)  Abnormality of gait  Imbalance  LVAD (left ventricular assist device) present (HCC)  Presence of left ventricular assist device (LVAD) (HCC)  Rationale for Evaluation and Treatment: Rehabilitation  SUBJECTIVE:                                                                                                                                                                                             SUBJECTIVE STATEMENT:  Pt reports he is doing good. Denies pain. Reports he likes his updated HEP. States the step-ups at the stairs went well at home. Denies stumbles/falls.  Pt brings his LVAD battery back-up with him during session.  [name], wife, arrived later in session and reports pt said he didn't have to exercise on the weekend. Therapist encouraged walking on weekends in a place that is enjoyable for patient.   Pt accompanied by: significant other, [name]  PERTINENT HISTORY:  Presented 02/05/2024 when his pacemaker went off abruptly resulting in a fall as well as noted right sided weakness and aphasia with blurred vision. Cranial CT scan positive for acute to subacute appearing left PCA left cerebellar infarction. Hyperdense left PCA suggesting occlusion of that vessel. He was also diagnosed with right frontal gyrus MCA infarction. No hemorrhagic transformation or mass effect. Underlying advanced chronic small vessel disease. Right supraorbital forehead recent soft tissue injury with no skull fracture identified. CTA head and neck demonstrating subtotal occlusion of the left PCA in the P2/P3 junction and additionally moderate to severe bilateral ICA siphon atherosclerosis with a least moderate siphon stenosis bilaterally.  Admitted to  rehab 02/10/2024 for inpatient therapies to consist of PT, ST and OT at least three hours five days a week. Past admission physiatrist, therapy team and rehab RN have worked together to provide customized collaborative inpatient rehab.  Pertaining to  patient's cardioembolic left cerebellar, PCA and right frontal MCA branch infarction secondary to LVAD device despite being on anticoagulation with warfarin.  Patient remained on Coumadin  therapy goal INR 2.5-3.0.  Atrial fibrillation/VT chronic systolic congestive heart failure status post LVAD 09/17/2021.  Continued Revatio  followed by heart failure team status post generator change 02/09/2024.   PAIN:  Are you having pain? No  PRECAUTIONS: ICD/Pacemaker and Other: LVAD, latex allergy   RED FLAGS: None and LVAD   WEIGHT BEARING RESTRICTIONS: LVAD and pace maker.   FALLS: Has patient fallen in last 6 months? No and pt unsure   LIVING ENVIRONMENT: Lives with: lives with their spouse Lives in: House/apartment Stairs: Yes: Internal: 9 steps; on right going up Has following equipment at home: Single point cane, Walker - 2 wheeled, and does not use  PLOF: Independent with basic ADLs and Independent with household mobility without device  PATIENT GOALS: pt reports that sight in the L is limited.  Improve balance slightly.   OBJECTIVE:  Note: Objective measures were completed at Evaluation unless otherwise noted.  DIAGNOSTIC FINDINGS:  CT head: IMPRESSION: 1. Expected evolution of Left cerebellar and Left PCA infarcts since yesterday. No hemorrhage or mass effect. 2. Evolution also of a Right MCA middle frontal gyrus infarct predicted by abnormal CTP T-max yesterday. No hemorrhage or mass effect. 3. Consider a recent Embolic event given the recent anterior and posterior vascular territory infarcts.  CT neck  IMPRESSION: 1. CTP does not detect the known Left PCA infarct core (although T-max oligemia corresponds to that finding), while CTA demonstrates subtotal occlusion of the Left PCA at the P2/P3 junction (some distal flow).   2. CTP also suggests Oligemia in the anterior Right MCA division, although no MCA branch occlusion identified by CTA.   3. The above were discussed by  telephone with Dr. Janett Medin on 02/05/2024 at 12:36 .   4. Additionally, Moderate to Severe bilateral ICA siphon atherosclerosis with at least Moderate siphon stenosis bilaterally (Moderate to Severe at the Right anterior genu).   5. Extracranial atherosclerosis but no significant Vertebral artery or extracranial carotid stenosis.   6. Aortic Atherosclerosis (ICD10-I70.0) and Emphysema (ICD10-J43.9).  DG chest FINDINGS: Single frontal view of the chest demonstrates stable left ventricular assist device and multi lead pacer/AICD. Cardiac silhouette remains enlarged. No acute airspace disease, effusion, or pneumothorax. No acute bony abnormalities.     COGNITION: Overall cognitive status: delayed response to history questions    SENSATION: WFL  COORDINATION:   Decreased speed of movement on the RLE>   POSTURE: forward head  LOWER EXTREMITY ROM:     AROM. Grossly WFL.   LOWER EXTREMITY MMT:    MMT Right Eval Left Eval  Hip flexion 4+ 4+  Hip extension    Hip abduction 5 5  Hip adduction 5 5  Hip internal rotation    Hip external rotation    Knee flexion 5 5  Knee extension 5 5  Ankle dorsiflexion 4+ 4+  Ankle plantarflexion    Ankle inversion    Ankle eversion    (Blank rows = not tested)  BED MOBILITY:  Not tested  TRANSFERS: Sit to stand: Complete Independence  Assistive device utilized: None     Stand to sit: Complete Independence  Assistive device utilized: None     Chair to chair: SBA  Assistive device utilized: None       RAMP:  Not tested  CURB:  Findings: CGA 1 UE support   STAIRS: Findings: Level of Assistance: SBA, Stair Negotiation Technique: Step to Pattern with Single Rail on Right Bilateral Rails, Number of Stairs: 8, Height of Stairs: 6   , and Comments: step through ascent step to descent  GAIT: Findings: Gait Characteristics: abducted- Right, Distance walked: 60, Assistive device utilized:None, Level of assistance: SBA and CGA, and  Comments: mild adduction  on the RLE.   FUNCTIONAL TESTS:  5 times sit to stand: 17.14 Timed up and go (TUG): 13.82 sec no AD 6 minute walk test: to be completed  10 meter walk test: 1.76m/s Berg Balance Scale: To be completed.  Dynamic Gait Index: 18 FGA: 21  PATIENT SURVEYS:  Stroke Impact Scale 71%                                                                                                                              TREATMENT DATE: 03/06/2024  Unless otherwise stated, CGA was provided and gait belt donned in order to ensure pt safety throughout session.  Gait training 362ft forward with SBA for safety during dual-task challenges of visually scanning to find finger held up by therapist to promote increased head turn - only 1x mild instability with greater head turn today.    B LE functional strengthening and dynamic balance challenges as follows:  Repeated step-ups onto green step with 1x purple plate, no UE support  Wearing 3lb AWs bilaterally Pt with improved R LE foot lift to clear step fully when stepping onto step, but initially not stepping R foot back far enough to step off of the step resulting in posterior LOB, but improved with repetition Requires CGA with frequent light min A for balance instability X10 reps per LE leading Progressed to lateral side stepping up and over green step with 1x purple plate, no UE support Wearing 3lb AW bilaterally Requires frequent min A for balance Continues to have decreased R LE foot clearance to bring foot up and off step Forward/backward stepping over 2 hurdles Wearing 3lb AW bilaterally Started with step-to progressed to reciprocal stepping over obstacles Pt with difficulty motor planning/sequencing reciprocal pattern initially until visual demonstration provided Continues to have some delayed/insufficient motor recruitment of R LE when trying to clear obstacle, potential depth perception? Side stepping over 2 hurdles Wearing  3lb AW bilaterally CGA with intermittent min A for balance Continues to have more impaired R LE foot clearance when bringing R LE in/adducting it back towards/underneath his BOS  Removed 3lb AWs  Therapist reinforced encouraged for pt to drink an adequate amount of water each day.  Dynamic gait training on airex balance beam, no UE support, including:  Forward walking down/back x2  Forward/backwards down/back x3 reps Side stepping down/back x3 reps Requires frequent min A due to imbalance with this activity, most challenged with backwards walking  Pt reports his legs feel like spaghetti from being tired.  Stair navigation training ascending/descending 8 steps (6 height) no HR support with cuing for reciprocal pattern for NMR - requires CGA/light min assist - improved R LE foot placement on step this session with pt not catching his toe nor heel.    PATIENT EDUCATION: Education details: POC.  Person educated: Patient Education method: Explanation Education comprehension: verbalized understanding  HOME EXERCISE PROGRAM:  Access Code: [email or web address] URL: [email or web address] Date: 03/02/2024 Prepared by: Jaison Petraglia  Exercises - Tandem Stance with Support  - 1 x daily - 4 x weekly - 3 sets - 4 reps - 20 seconds  hold - Forward and Backward Walking with Eyes Closed and Counter Support  - 1 x daily - 4 x weekly - 3 sets - 5 reps - Side Stepping with Resistance at Thighs and Counter Support  - 1 x daily - 7 x weekly - 2 sets - 10 reps - Forward Step Up with Unilateral Counter Support  - 1 x daily - 7 x weekly - 1-2 sets - 10 reps - Walking  - 3 x daily - 7 x weekly - 1 sets - 6 minutes hold  GOALS: Goals reviewed with patient? Yes  SHORT TERM GOALS: Target date: 03/29/2024    Patient will be independent in home exercise program to improve strength/mobility for better functional independence with ADLs. Baseline: provided on 6/4 Goal status: INITIAL   LONG TERM  GOALS: Target date: 05/17/2024    Patient will increase SIS 16 score to equal to or greater than  10 points.    to demonstrate statistically significant improvement in mobility and quality of life.  Baseline: 71% Goal status: INITIAL  2.  Patient (> 26 years old) will complete five times sit to stand test in < 15 seconds indicating an increased LE strength and improved balance. Baseline: 17.14sec Goal status: INITIAL  3.  Patient will increase Berg Balance score by > 6 points to demonstrate decreased fall risk during functional activities Baseline: to be completed  Goal status: INITIAL   4.  Patient will reduce timed up and go to <11 seconds to reduce fall risk and demonstrate improved transfer/gait ability. Baseline: 13.82 seconds  Goal status: INITIAL  5.  Patient will increase FGA  score to >/24 as to demonstrate reduced fall risk and improved dynamic gait balance for better safety with community/home ambulation.   Baseline: 21 Goal status: INITIAL  6.  Patient will increase 6 min walk test >165ft to demonstrate decreased fall risk during functional activities as well as improved access to community mobility.  Baseline: to be completed ; 02/29/24: 1165' Goal status: INITIAL   ASSESSMENT:  CLINICAL IMPRESSION:   Therapy session focused on higher level dynamic balance challenges including step-ups and forward/lateral step over hurdles. Pt demonstrates improved strength and balance to step-up onto green step without UE support today. Patient demonstrates improving ability to lift R LE up fully to clear step or hurdle during these interventions as well. Patient then progressed to gait training on airex balance beam to progress towards unlevel surfaces, which pt will benefit from continuation and progress of. Patient also performed stair navigation demonstrating improved balance, strength, and coordination to perform this without railing support and only CGA/min A while using reciprocal  pattern. POC will continue to focus on higher level dynamic balance and gait along with RLE strengthening to further reduce falls risk. Pt will benefit from continued skilled PT to allow return to PLOF and increased overall QoL.    OBJECTIVE IMPAIRMENTS: Abnormal gait, cardiopulmonary status limiting activity, decreased activity tolerance, decreased balance, decreased coordination, decreased endurance, decreased mobility, difficulty walking, decreased strength, and improper body mechanics.   ACTIVITY LIMITATIONS: carrying, lifting, squatting, stairs, transfers, locomotion level, and caring for others  PARTICIPATION LIMITATIONS: cleaning, laundry, driving, shopping, community activity, occupation, and yard work  PERSONAL FACTORS: Age and 3+ comorbidities: LVAD, Pacemaker, hypotension  are also affecting patient's functional outcome.   REHAB POTENTIAL: Excellent  CLINICAL DECISION  MAKING: Stable/uncomplicated  EVALUATION COMPLEXITY: Low  PLAN:  PT FREQUENCY: 1-2x/week  PT DURATION: 12 weeks  PLANNED INTERVENTIONS: 97164- PT Re-evaluation, 97750- Physical Performance Testing, 97110-Therapeutic exercises, 97530- Therapeutic activity, 97112- Neuromuscular re-education, 97535- Self Care, 16109- Manual therapy, [id number]- Gait training, Patient/Family education, Balance training, Stair training, Taping, DME instructions, Cryotherapy, and Moist heat  PLAN FOR NEXT SESSION:   - higher level dynamic balance challenges - R LE strengthening - higher level dynamic gait challenges   - include unstable surfaces, stepping over obstacles, dual-tasks for cognitive/speech challenges - balance with reduced BOS  Continue progressing   Shaylynne Lunt, PT, DPT, NCS, CSRS Physical Therapist - [place]  [facility]  2:49 PM 03/06/24

## 2024-03-06 NOTE — Therapy (Signed)
 OUTPATIENT SPEECH LANGUAGE PATHOLOGY  COGNITIVE COMMUNICATION TREATMENT NOTE   Patient Name: Isaac [name] MRN: [id number] DOB:[birth year], 73 y.o., male Today's Date: 03/06/2024  PCP: Isaac San, MD REFERRING PROVIDER: Georjean Kite, PA   End of Session - 03/06/24 1323     Visit Number 4    Number of Visits 25    Date for SLP Re-Evaluation 05/17/24    Authorization Type [employer] HMO/PPO    Progress Note Due on Visit 10    SLP Start Time 1320    SLP Stop Time  1400    SLP Time Calculation (min) 40 min    Activity Tolerance Patient tolerated treatment well          Past Medical History:  Diagnosis Date   Arrhythmia    atrial fibrillation   CHF (congestive heart failure) (HCC)    Chronic kidney disease    Coronary artery disease    Hyperlipidemia    Hypertension    Myocardial infarct [facility])    Past Surgical History:  Procedure Laterality Date   CARDIAC DEFIBRILLATOR PLACEMENT  feb 2014   ICD GENERATOR CHANGEOUT N/A 02/09/2024   Procedure: ICD GENERATOR CHANGEOUT;  Surgeon: Isaac Fall, MD;  Location: [facility] INVASIVE CV LAB;  Service: Cardiovascular;  Laterality: N/A;   INSERTION OF IMPLANTABLE LEFT VENTRICULAR ASSIST DEVICE N/A 09/17/2021   Procedure: INSERTION OF IMPLANTABLE LEFT VENTRICULAR ASSIST DEVICE AND INSERTION OF FEMORAL ARTERIAL LINE;  Surgeon: Isaac Lightning, MD;  Location: [facility] OR;  Service: Open Heart Surgery;  Laterality: N/A;   IR FLUORO GUIDE CV LINE RIGHT  09/27/2021   IR THORACENTESIS ASP PLEURAL SPACE W/IMG GUIDE  09/27/2021   IR US  GUIDE VASC ACCESS RIGHT  09/27/2021   PLACEMENT OF IMPELLA LEFT VENTRICULAR ASSIST DEVICE N/A 09/12/2021   Procedure: PLACEMENT OF IMPELLA 5.5 LEFT VENTRICULAR ASSIST DEVICE;  Surgeon: Isaac Higashi, MD;  Location: [facility];  Service: Open Heart Surgery;  Laterality: N/A;   RIGHT HEART CATH N/A 09/08/2021   Procedure: RIGHT HEART CATH;  Surgeon: Isaac Eisenmenger, MD;  Location: [facility] INVASIVE CV LAB;  Service:  Cardiovascular;  Laterality: N/A;   TEE WITHOUT CARDIOVERSION N/A 09/12/2021   Procedure: TRANSESOPHAGEAL ECHOCARDIOGRAM (TEE);  Surgeon: Isaac Higashi, MD;  Location: [facility] OR;  Service: Open Heart Surgery;  Laterality: N/A;   TEE WITHOUT CARDIOVERSION N/A 09/17/2021   Procedure: TRANSESOPHAGEAL ECHOCARDIOGRAM (TEE);  Surgeon: Isaac Lightning, MD;  Location: [facility] OR;  Service: Open Heart Surgery;  Laterality: N/A;   Patient Active Problem List   Diagnosis Date Noted   Hematoma 02/17/2024   Anemia 02/15/2024   Hypotension 02/15/2024   Sundowning 02/11/2024   Insomnia 02/11/2024   Constipation 02/11/2024   Stage 3b chronic kidney disease (HCC) 02/11/2024   Cardioembolic stroke (HCC) 02/10/2024   Acute left PCA stroke (HCC) 02/05/2024   Abnormal thyroid  blood test 04/05/2023   Abscess of left groin 11/27/2022   VT (ventricular tachycardia) (HCC) 10/27/2022   ICD (implantable cardioverter-defibrillator) in place 10/27/2022   Pressure injury of skin 10/07/2021   LVAD (left ventricular assist device) present (HCC)    Protein-calorie malnutrition, severe 09/10/2021   Elevated TSH    PICC (peripherally inserted central catheter) in place    Cardiogenic shock (HCC)    Syncope 09/07/2021   CHF (congestive heart failure) (HCC) 09/06/2021   CAD (coronary artery disease) 08/22/2021   Cardiomyopathy (HCC) 08/22/2021   Mixed hyperlipidemia 08/22/2021   A-fib (HCC) 08/22/2021   Elevated troponin 08/22/2021  Acute on chronic combined systolic and diastolic CHF (congestive heart failure) (HCC) 08/22/2021   Benign essential hypertension 02/22/2015   Chronic systolic heart failure (HCC) 07/05/2014    ONSET DATE: 02/05/2024; date of referral  02/22/2024  REFERRING DIAG: V40.981 (ICD-10-CM) - Cerebral infarction due to unspecified occlusion or stenosis of left posterior cerebral artery  THERAPY DIAG:  Cognitive communication deficit  Rationale for Evaluation and Treatment  Rehabilitation  SUBJECTIVE:   PERTINENT HISTORY and DIAGNOSTIC FINDINGS:  Mr. Isaac [name] is a 73 y.o. with history of CAD, HTN, atrial fibrillation, CKD IV, VT and chronic systolic CHFs/p HM-3 LVAD on 09/17/21.  He presented to the [facility] ED on 02/05/24 for focal neuro deficits with R vision field cut and dysmetria. Code Stroke activated. CT brain positive for acute to subacute appearing Left PCA and Left cerebellar infarcts. Hyperdense Left PCA suggests occlusion of that vessel. No hemorrhagic transformation or mass effect. Seen by Neuro and decision made to treat with IV heparin . Echocardiogram shows EF 25 to 30% with LVAD at the apex.  Left atrial is moderately dilate. CT angio distal left P2/P3 occlusion.Repeat CT head 02/06/24 with evolving left cerebellar, left PCA and right frontal gyrus MCA branch infarct. No hemorrhage.  Neurology feels infarcts 2/2 LVAD and signed off  Pt attended [facility] CIR from until 02/18/2024, received ST services targeting cognitive linguistic abilities and discharged at MOD A for cognitive linguistic skills. Memory/orientation deficits remain the pt's biggest barrier to progress and overall safety.   PAIN:  Are you having pain? No   FALLS: Has patient fallen in last 6 months?  No  LIVING ENVIRONMENT: Lives with: lives with their spouse Lives in: House/apartment  PLOF:  Level of assistance: Independent with ADLs, Independent with IADLs Employment: Retired   PATIENT GOALS   to improve functional independence  SUBJECTIVE STATEMENT: Pt pleasant, continued good effort towards all tasks Pt accompanied by: significant other  OBJECTIVE:  TODAY'S TREATMENT:  Skilled treatment session focused on pt's cognitive communication goals. SLP facilitated session by providing the following interventions:   SLP reviewed use of alarm feature on cell phone to aid in prospective memory. Patient's wife reported exercises were completed on Friday, but on  Saturday and Sunday, he told her he was taking the weekend off. Did not complete today as he was scheduled to come to therapy. Educated on following therapist's recommendation for frequency of exercise completion. Patient did not have any other thoughts on other uses for alarms. Discussion of tasks at home prompted patient to bring up things such as mowing the lawn.  TalkPath Therapy: Attention/reasoning/visual scanning targeted through:   Name the Category Level 2: 90% acc, with verbal choice necessary 30% of the time Symbol matching Level 1:  80% acc, verbal cues for attention to detail  PATIENT REPORTED OUTCOME MEASURES (PROM): To be completed at the next treatment session    PATIENT EDUCATION: Education details: see above Person educated: Patient and Spouse Education method: Explanation Education comprehension: verbalized understanding   HOME EXERCISE PROGRAM:   Set alarms on phone TalkPath Therapy, see above   GOALS:  Goals reviewed with patient? Yes  SHORT TERM GOALS: Target date: 10 sessions  With Mod A, pt will utilize external memory aids to answer questions regarding orientation information with 80% accuracy.  Baseline: Goal status: INITIAL  2.  With Mod A, pt will improve word finding by using strategies to produce generative naming list of equipment and vocabulary related to LVAD.  Baseline:  Goal  status: INITIAL  3.  Pt will complete addition cognitive assessment to establish current level of ability.  Baseline: Pt discharged from CIR requiring Mod A for basic cognitive function.  Goal status: INITIAL   LONG TERM GOALS: Target date: 05/17/2024  With supervision A, pt will attend to items on his right to improve safety awareness and accuracy of task completion in 5 out of 7 opportunities.  Baseline:  Goal status: INITIAL  2.  With Min A, pt will external and internal memory aides to help short term and prospective memory in 5 out of 7 opportunities.   Baseline:  Goal status: INITIAL  3.  With supervision A, pt will be able to verbally sequence (using appropriate vocabulary) LVAD features, alerts and switching battery sources.  Baseline:  Goal status: INITIAL   ASSESSMENT:  CLINICAL IMPRESSION: Patient is a 73 y.o. male LVAD patient, who was seen today for a cognitive communication treatment d/t left cerebellar, left PCA and right frontal gyrus MCA branch infarct. Pt presents with severe cognitive impairment that is c/b by severe deficits in attention, awareness (pt is aware of difficulty/poor performance within tasks but unaware of overall impairment and impact on safety), profound memory deficits in immediate recall, short term memory, retrieval, visual, auditory and prospective memory decreased awareness of confabulatory responses and severe deficits in executive function as observed during visuospatial tasks, specifically task planning, organization, sequencing with pt losing points d/t slow task completion. Overall adequate expressive and receptive communication.   Pt continues to be eager and puts forth great effort. See the above treatment note for details.     OBJECTIVE IMPAIRMENTS include attention, memory, awareness, and executive functioning. These impairments are limiting patient from managing medications, managing appointments, managing finances, household responsibilities, and ADLs/IADLs. Factors affecting potential to achieve goals and functional outcome are ability to learn/carryover information, co-morbidities, medical prognosis, previous level of function, and severity of impairments. Patient will benefit from skilled SLP services to address above impairments and improve overall function.  REHAB POTENTIAL: Good  PLAN: SLP FREQUENCY: 1-2x/week  SLP DURATION: 12 weeks  PLANNED INTERVENTIONS: Language facilitation, Environmental controls, Cueing hierachy, Cognitive reorganization, Internal/external aids, Functional tasks,  SLP instruction and feedback, Compensatory strategies, and Patient/family education  Scheryl Cushing, [place], CCC-SLP Speech-Language Pathologist [phone]   [facility] Outpatient Rehabilitation at [facility] [address] [place], [place], [address] Phone: [phone]   Fax:  [phone]  Patient Details  Name: Isaac [name] MRN: [id number] Date of Birth: [birth year] Referring Provider:  Sterling Eisenmenger, PA-C  Encounter Date: 03/06/2024   Luster Salters, CCC-SLP 03/06/2024, 1:23 PM  [facility] Outpatient Rehabilitation at [facility] [address] [place], [place], [address] Phone: [phone]   Fax:  [phone]

## 2024-03-08 ENCOUNTER — Encounter

## 2024-03-08 ENCOUNTER — Ambulatory Visit

## 2024-03-09 ENCOUNTER — Ambulatory Visit

## 2024-03-09 ENCOUNTER — Ambulatory Visit: Admitting: Physical Therapy

## 2024-03-09 DIAGNOSIS — R278 Other lack of coordination: Secondary | ICD-10-CM

## 2024-03-09 DIAGNOSIS — R269 Unspecified abnormalities of gait and mobility: Secondary | ICD-10-CM

## 2024-03-09 DIAGNOSIS — M6281 Muscle weakness (generalized): Secondary | ICD-10-CM

## 2024-03-09 DIAGNOSIS — R41841 Cognitive communication deficit: Secondary | ICD-10-CM

## 2024-03-09 DIAGNOSIS — R2689 Other abnormalities of gait and mobility: Secondary | ICD-10-CM

## 2024-03-09 NOTE — Therapy (Signed)
 OUTPATIENT PHYSICAL THERAPY NEURO TREATMENT    Patient Name: Maison Kestenbaum MRN: [id number] DOB:Oct 20, 1950, 73 y.o., male Today's Date: 03/09/2024   PCP: Valora Agent, MD REFERRING PROVIDER: Pegge Toribio PARAS, PA-C   END OF SESSION:   PT End of Session - 03/09/24 1147     Visit Number 5    Number of Visits 24    Date for PT Re-Evaluation 05/17/24    PT Start Time 1146    PT Stop Time 1226    PT Time Calculation (min) 40 min    Equipment Utilized During Treatment Gait belt    Activity Tolerance Patient tolerated treatment well    Behavior During Therapy WFL for tasks assessed/performed            Past Medical History:  Diagnosis Date   Arrhythmia    atrial fibrillation   CHF (congestive heart failure) (HCC)    Chronic kidney disease    Coronary artery disease    Hyperlipidemia    Hypertension    Myocardial infarct [facility])    Past Surgical History:  Procedure Laterality Date   CARDIAC DEFIBRILLATOR PLACEMENT  feb 2014   ICD GENERATOR CHANGEOUT N/A 02/09/2024   Procedure: ICD GENERATOR CHANGEOUT;  Surgeon: Waddell Danelle ORN, MD;  Location: [facility] INVASIVE CV LAB;  Service: Cardiovascular;  Laterality: N/A;   INSERTION OF IMPLANTABLE LEFT VENTRICULAR ASSIST DEVICE N/A 09/17/2021   Procedure: INSERTION OF IMPLANTABLE LEFT VENTRICULAR ASSIST DEVICE AND INSERTION OF FEMORAL ARTERIAL LINE;  Surgeon: Lucas Dorise POUR, MD;  Location: [facility] OR;  Service: Open Heart Surgery;  Laterality: N/A;   IR FLUORO GUIDE CV LINE RIGHT  09/27/2021   IR THORACENTESIS ASP PLEURAL SPACE W/IMG GUIDE  09/27/2021   IR US  GUIDE VASC ACCESS RIGHT  09/27/2021   PLACEMENT OF IMPELLA LEFT VENTRICULAR ASSIST DEVICE N/A 09/12/2021   Procedure: PLACEMENT OF IMPELLA 5.5 LEFT VENTRICULAR ASSIST DEVICE;  Surgeon: Kerrin Elspeth BROCKS, MD;  Location: [facility] OR;  Service: Open Heart Surgery;  Laterality: N/A;   RIGHT HEART CATH N/A 09/08/2021   Procedure: RIGHT HEART CATH;  Surgeon: Rolan Ezra RAMAN, MD;  Location: [facility]  INVASIVE CV LAB;  Service: Cardiovascular;  Laterality: N/A;   TEE WITHOUT CARDIOVERSION N/A 09/12/2021   Procedure: TRANSESOPHAGEAL ECHOCARDIOGRAM (TEE);  Surgeon: Kerrin Elspeth BROCKS, MD;  Location: [facility] OR;  Service: Open Heart Surgery;  Laterality: N/A;   TEE WITHOUT CARDIOVERSION N/A 09/17/2021   Procedure: TRANSESOPHAGEAL ECHOCARDIOGRAM (TEE);  Surgeon: Lucas Dorise POUR, MD;  Location: [facility] OR;  Service: Open Heart Surgery;  Laterality: N/A;   Patient Active Problem List   Diagnosis Date Noted   Hematoma 02/17/2024   Anemia 02/15/2024   Hypotension 02/15/2024   Sundowning 02/11/2024   Insomnia 02/11/2024   Constipation 02/11/2024   Stage 3b chronic kidney disease (HCC) 02/11/2024   Cardioembolic stroke (HCC) 02/10/2024   Acute left PCA stroke (HCC) 02/05/2024   Abnormal thyroid  blood test 04/05/2023   Abscess of left groin 11/27/2022   VT (ventricular tachycardia) (HCC) 10/27/2022   ICD (implantable cardioverter-defibrillator) in place 10/27/2022   Pressure injury of skin 10/07/2021   LVAD (left ventricular assist device) present (HCC)    Protein-calorie malnutrition, severe 09/10/2021   Elevated TSH    PICC (peripherally inserted central catheter) in place    Cardiogenic shock (HCC)    Syncope 09/07/2021   CHF (congestive heart failure) (HCC) 09/06/2021   CAD (coronary artery disease) 08/22/2021   Cardiomyopathy (HCC) 08/22/2021   Mixed hyperlipidemia 08/22/2021  A-fib (HCC) 08/22/2021   Elevated troponin 08/22/2021   Acute on chronic combined systolic and diastolic CHF (congestive heart failure) (HCC) 08/22/2021   Benign essential hypertension 02/22/2015   Chronic systolic heart failure (HCC) 07/05/2014    ONSET DATE: 02/05/24  REFERRING DIAG:  P36.467 (ICD-10-CM) - Cerebral infarction due to unspecified occlusion or stenosis of left posterior cerebral artery   THERAPY DIAG:  Muscle weakness (generalized)  Other lack of coordination  Abnormality of  gait  Imbalance  Rationale for Evaluation and Treatment: Rehabilitation  SUBJECTIVE:                                                                                                                                                                                             SUBJECTIVE STATEMENT:  Pt reports he is doing good. Denies pain. S Denies stumbles/falls.  Pt brings his LVAD battery back-up with him during session.  [name], wife, arrived later in session.  Pt accompanied by: significant other, [name]  PERTINENT HISTORY:  Presented 02/05/2024 when his pacemaker went off abruptly resulting in a fall as well as noted right sided weakness and aphasia with blurred vision. Cranial CT scan positive for acute to subacute appearing left PCA left cerebellar infarction. Hyperdense left PCA suggesting occlusion of that vessel. He was also diagnosed with right frontal gyrus MCA infarction. No hemorrhagic transformation or mass effect. Underlying advanced chronic small vessel disease. Right supraorbital forehead recent soft tissue injury with no skull fracture identified. CTA head and neck demonstrating subtotal occlusion of the left PCA in the P2/P3 junction and additionally moderate to severe bilateral ICA siphon atherosclerosis with a least moderate siphon stenosis bilaterally.  Admitted to  rehab 02/10/2024 for inpatient therapies to consist of PT, ST and OT at least three hours five days a week. Past admission physiatrist, therapy team and rehab RN have worked together to provide customized collaborative inpatient rehab.  Pertaining to patient's cardioembolic left cerebellar, PCA and right frontal MCA branch infarction secondary to LVAD device despite being on anticoagulation with warfarin.  Patient remained on Coumadin  therapy goal INR 2.5-3.0.  Atrial fibrillation/VT chronic systolic congestive heart failure status post LVAD 09/17/2021.  Continued Revatio  followed by heart failure team status post  generator change 02/09/2024.   PAIN:  Are you having pain? No  PRECAUTIONS: ICD/Pacemaker and Other: LVAD, latex allergy   RED FLAGS: None and LVAD   WEIGHT BEARING RESTRICTIONS: LVAD and pace maker.   FALLS: Has patient fallen in last 6 months? No and pt unsure   LIVING ENVIRONMENT: Lives with: lives with their spouse Lives in: House/apartment Stairs: Yes: Internal: 9 steps; on  right going up Has following equipment at home: Single point cane, Walker - 2 wheeled, and does not use  PLOF: Independent with basic ADLs and Independent with household mobility without device  PATIENT GOALS: pt reports that sight in the L is limited.  Improve balance slightly.   OBJECTIVE:  Note: Objective measures were completed at Evaluation unless otherwise noted.  DIAGNOSTIC FINDINGS:  CT head: IMPRESSION: 1. Expected evolution of Left cerebellar and Left PCA infarcts since yesterday. No hemorrhage or mass effect. 2. Evolution also of a Right MCA middle frontal gyrus infarct predicted by abnormal CTP T-max yesterday. No hemorrhage or mass effect. 3. Consider a recent Embolic event given the recent anterior and posterior vascular territory infarcts.  CT neck  IMPRESSION: 1. CTP does not detect the known Left PCA infarct core (although T-max oligemia corresponds to that finding), while CTA demonstrates subtotal occlusion of the Left PCA at the P2/P3 junction (some distal flow).   2. CTP also suggests Oligemia in the anterior Right MCA division, although no MCA branch occlusion identified by CTA.   3. The above were discussed by telephone with Dr. Rosemarie on 02/05/2024 at 12:36 .   4. Additionally, Moderate to Severe bilateral ICA siphon atherosclerosis with at least Moderate siphon stenosis bilaterally (Moderate to Severe at the Right anterior genu).   5. Extracranial atherosclerosis but no significant Vertebral artery or extracranial carotid stenosis.   6. Aortic Atherosclerosis  (ICD10-I70.0) and Emphysema (ICD10-J43.9).  DG chest FINDINGS: Single frontal view of the chest demonstrates stable left ventricular assist device and multi lead pacer/AICD. Cardiac silhouette remains enlarged. No acute airspace disease, effusion, or pneumothorax. No acute bony abnormalities.     COGNITION: Overall cognitive status: delayed response to history questions    SENSATION: WFL  COORDINATION:   Decreased speed of movement on the RLE>   POSTURE: forward head  LOWER EXTREMITY ROM:     AROM. Grossly WFL.   LOWER EXTREMITY MMT:    MMT Right Eval Left Eval  Hip flexion 4+ 4+  Hip extension    Hip abduction 5 5  Hip adduction 5 5  Hip internal rotation    Hip external rotation    Knee flexion 5 5  Knee extension 5 5  Ankle dorsiflexion 4+ 4+  Ankle plantarflexion    Ankle inversion    Ankle eversion    (Blank rows = not tested)  BED MOBILITY:  Not tested  TRANSFERS: Sit to stand: Complete Independence  Assistive device utilized: None     Stand to sit: Complete Independence  Assistive device utilized: None     Chair to chair: SBA  Assistive device utilized: None       RAMP:  Not tested  CURB:  Findings: CGA 1 UE support   STAIRS: Findings: Level of Assistance: SBA, Stair Negotiation Technique: Step to Pattern with Single Rail on Right Bilateral Rails, Number of Stairs: 8, Height of Stairs: 6   , and Comments: step through ascent step to descent  GAIT: Findings: Gait Characteristics: abducted- Right, Distance walked: 60, Assistive device utilized:None, Level of assistance: SBA and CGA, and Comments: mild adduction on the RLE.   FUNCTIONAL TESTS:  5 times sit to stand: 17.14 Timed up and go (TUG): 13.82 sec no AD 6 minute walk test: to be completed  10 meter walk test: 1.43m/s Berg Balance Scale: To be completed.  Dynamic Gait Index: 18 FGA: 21  PATIENT SURVEYS:  Stroke Impact Scale 71%  TREATMENT DATE: 03/09/2024  Unless otherwise stated, CGA was provided and gait belt donned in order to ensure pt safety throughout session.  Gait training 364ft forward with SBA for safety difficulty with R and L directions, but no LOB or instability   Activity Description: side stepping to pod single color on first bout. R to Red and L to Green for bout 2-4 Activity Setting:  random Number of Pods:  5 Cycles/Sets:  5 Duration (Time or Hit Count):  1:74min  Patient Stats  Hits:   22, 19(3 wrong), 17(4 wrong), 14(0 wrong)  Activity Description: rectangle with home base in the middle Activity Setting:  home base Number of Pods:  5 Cycles/Sets:  4 Duration (Time or Hit Count):  1 min   Patient Stats  Hits:   8, 8, 9, 9   Weighted gait with 4# AW with cognitive demand to name car brands x 747ft.  Forward/reverse gait with 4# AW x 6 with cognitive demand to continue naming car brands, mild decrease in speed with   Mild LOB with stepping strategy to correct LOB on last bout of Blaze pods. No assists required to correct  PATIENT EDUCATION: Education details: POC. Pt educated throughout session about proper posture and technique with exercises. Improved exercise technique, movement at target joints, use of target muscles after min to mod verbal, visual, tactile cues.  Person educated: Patient Education method: Explanation Education comprehension: verbalized understanding  HOME EXERCISE PROGRAM:  Access Code: [email or web address] URL: [email or web address] Date: 03/02/2024 Prepared by: Connell Kiss  Exercises - Tandem Stance with Support  - 1 x daily - 4 x weekly - 3 sets - 4 reps - 20 seconds  hold - Forward and Backward Walking with Eyes Closed and Counter Support  - 1 x daily - 4 x weekly - 3 sets - 5 reps - Side Stepping with Resistance at Thighs and Counter Support  - 1 x daily - 7 x weekly - 2 sets - 10  reps - Forward Step Up with Unilateral Counter Support  - 1 x daily - 7 x weekly - 1-2 sets - 10 reps - Walking  - 3 x daily - 7 x weekly - 1 sets - 6 minutes hold  GOALS: Goals reviewed with patient? Yes  SHORT TERM GOALS: Target date: 03/29/2024    Patient will be independent in home exercise program to improve strength/mobility for better functional independence with ADLs. Baseline: provided on 6/4 Goal status: INITIAL   LONG TERM GOALS: Target date: 05/17/2024    Patient will increase SIS 16 score to equal to or greater than  10 points.    to demonstrate statistically significant improvement in mobility and quality of life.  Baseline: 71% Goal status: INITIAL  2.  Patient (> 12 years old) will complete five times sit to stand test in < 15 seconds indicating an increased LE strength and improved balance. Baseline: 17.14sec Goal status: INITIAL  3.  Patient will increase Berg Balance score by > 6 points to demonstrate decreased fall risk during functional activities Baseline: to be completed  Goal status: INITIAL   4.  Patient will reduce timed up and go to <11 seconds to reduce fall risk and demonstrate improved transfer/gait ability. Baseline: 13.82 seconds  Goal status: INITIAL  5.  Patient will increase FGA  score to >/24 as to demonstrate reduced fall risk and improved dynamic gait balance for better safety with community/home ambulation.   Baseline: 21 Goal status: INITIAL  6.  Patient  will increase 6 min walk test >14ft to demonstrate decreased fall risk during functional activities as well as improved access to community mobility.  Baseline: to be completed ; 02/29/24: 1165' Goal status: INITIAL   ASSESSMENT:  CLINICAL IMPRESSION:   Therapy session focused on higher level dynamic balance challenges including increased cognitive load and split attention training. Pt demonstrated decreased speed when engaged with cognitive tast, but improved accuracy.  POC will  continue to focus on higher level dynamic balance and gait along with RLE strengthening to further reduce falls risk. Pt will benefit from continued skilled PT to allow return to PLOF and increased overall QoL.    OBJECTIVE IMPAIRMENTS: Abnormal gait, cardiopulmonary status limiting activity, decreased activity tolerance, decreased balance, decreased coordination, decreased endurance, decreased mobility, difficulty walking, decreased strength, and improper body mechanics.   ACTIVITY LIMITATIONS: carrying, lifting, squatting, stairs, transfers, locomotion level, and caring for others  PARTICIPATION LIMITATIONS: cleaning, laundry, driving, shopping, community activity, occupation, and yard work  PERSONAL FACTORS: Age and 3+ comorbidities: LVAD, Pacemaker, hypotension  are also affecting patient's functional outcome.   REHAB POTENTIAL: Excellent  CLINICAL DECISION MAKING: Stable/uncomplicated  EVALUATION COMPLEXITY: Low  PLAN:  PT FREQUENCY: 1-2x/week  PT DURATION: 12 weeks  PLANNED INTERVENTIONS: [id number]- PT Re-evaluation, 97750- Physical Performance Testing, 97110-Therapeutic exercises, 97530- Therapeutic activity, 97112- Neuromuscular re-education, 97535- Self Care, [id number]- Manual therapy, [phone]- Gait training, Patient/Family education, Balance training, Stair training, Taping, DME instructions, Cryotherapy, and Moist heat  PLAN FOR NEXT SESSION:   - higher level dynamic balance challenges - R LE strengthening - higher level dynamic gait challenges   - include unstable surfaces, stepping over obstacles, dual-tasks for cognitive/speech challenges - balance with reduced BOS  Continue progressing   Massie Dollar PT, DPT  Physical Therapist - [facility]  [facility]  9:18 AM 03/10/24

## 2024-03-09 NOTE — Therapy (Signed)
 OUTPATIENT SPEECH LANGUAGE PATHOLOGY  COGNITIVE COMMUNICATION TREATMENT NOTE   Patient Name: Isaac [name] MRN: [id number] DOB:1950-12-22, 73 y.o., male Today's Date: 03/09/2024  PCP: Lyle San, MD REFERRING PROVIDER: Georjean Kite, PA   End of Session - 03/09/24 1103     Visit Number 5    Number of Visits 25    Date for SLP Re-Evaluation 05/17/24    Authorization Type [employer] Medicare HMO/PPO    Progress Note Due on Visit 10    SLP Start Time 1105    SLP Stop Time  1145    SLP Time Calculation (min) 40 min    Activity Tolerance Patient tolerated treatment well          Past Medical History:  Diagnosis Date   Arrhythmia    atrial fibrillation   CHF (congestive heart failure) (HCC)    Chronic kidney disease    Coronary artery disease    Hyperlipidemia    Hypertension    Myocardial infarct [facility])    Past Surgical History:  Procedure Laterality Date   CARDIAC DEFIBRILLATOR PLACEMENT  feb 2014   ICD GENERATOR CHANGEOUT N/A 02/09/2024   Procedure: ICD GENERATOR CHANGEOUT;  Surgeon: Tammie Fall, MD;  Location: [facility] INVASIVE CV LAB;  Service: Cardiovascular;  Laterality: N/A;   INSERTION OF IMPLANTABLE LEFT VENTRICULAR ASSIST DEVICE N/A 09/17/2021   Procedure: INSERTION OF IMPLANTABLE LEFT VENTRICULAR ASSIST DEVICE AND INSERTION OF FEMORAL ARTERIAL LINE;  Surgeon: Bartley Lightning, MD;  Location: [facility] OR;  Service: Open Heart Surgery;  Laterality: N/A;   IR FLUORO GUIDE CV LINE RIGHT  09/27/2021   IR THORACENTESIS ASP PLEURAL SPACE W/IMG GUIDE  09/27/2021   IR US  GUIDE VASC ACCESS RIGHT  09/27/2021   PLACEMENT OF IMPELLA LEFT VENTRICULAR ASSIST DEVICE N/A 09/12/2021   Procedure: PLACEMENT OF IMPELLA 5.5 LEFT VENTRICULAR ASSIST DEVICE;  Surgeon: Zelphia Higashi, MD;  Location: [facility] OR;  Service: Open Heart Surgery;  Laterality: N/A;   RIGHT HEART CATH N/A 09/08/2021   Procedure: RIGHT HEART CATH;  Surgeon: Darlis Eisenmenger, MD;  Location: [facility] INVASIVE CV LAB;  Service:  Cardiovascular;  Laterality: N/A;   TEE WITHOUT CARDIOVERSION N/A 09/12/2021   Procedure: TRANSESOPHAGEAL ECHOCARDIOGRAM (TEE);  Surgeon: Zelphia Higashi, MD;  Location: [facility] OR;  Service: Open Heart Surgery;  Laterality: N/A;   TEE WITHOUT CARDIOVERSION N/A 09/17/2021   Procedure: TRANSESOPHAGEAL ECHOCARDIOGRAM (TEE);  Surgeon: Bartley Lightning, MD;  Location: [facility] OR;  Service: Open Heart Surgery;  Laterality: N/A;   Patient Active Problem List   Diagnosis Date Noted   Hematoma 02/17/2024   Anemia 02/15/2024   Hypotension 02/15/2024   Sundowning 02/11/2024   Insomnia 02/11/2024   Constipation 02/11/2024   Stage 3b chronic kidney disease (HCC) 02/11/2024   Cardioembolic stroke (HCC) 02/10/2024   Acute left PCA stroke (HCC) 02/05/2024   Abnormal thyroid  blood test 04/05/2023   Abscess of left groin 11/27/2022   VT (ventricular tachycardia) (HCC) 10/27/2022   ICD (implantable cardioverter-defibrillator) in place 10/27/2022   Pressure injury of skin 10/07/2021   LVAD (left ventricular assist device) present (HCC)    Protein-calorie malnutrition, severe 09/10/2021   Elevated TSH    PICC (peripherally inserted central catheter) in place    Cardiogenic shock (HCC)    Syncope 09/07/2021   CHF (congestive heart failure) (HCC) 09/06/2021   CAD (coronary artery disease) 08/22/2021   Cardiomyopathy (HCC) 08/22/2021   Mixed hyperlipidemia 08/22/2021   A-fib (HCC) 08/22/2021   Elevated troponin 08/22/2021  Acute on chronic combined systolic and diastolic CHF (congestive heart failure) (HCC) 08/22/2021   Benign essential hypertension 02/22/2015   Chronic systolic heart failure (HCC) 07/05/2014    ONSET DATE: 02/05/2024; date of referral  02/22/2024  REFERRING DIAG: Z61.096 (ICD-10-CM) - Cerebral infarction due to unspecified occlusion or stenosis of left posterior cerebral artery  THERAPY DIAG:  Cognitive communication deficit  Rationale for Evaluation and Treatment  Rehabilitation  SUBJECTIVE:   PERTINENT HISTORY and DIAGNOSTIC FINDINGS:  [name] is a 73 y.o. with history of CAD, HTN, atrial fibrillation, CKD IV, VT and chronic systolic CHFs/p HM-3 LVAD on 09/17/21.  He presented to the [facility] ED on 02/05/24 for focal neuro deficits with R vision field cut and dysmetria. Code Stroke activated. CT brain positive for acute to subacute appearing Left PCA and Left cerebellar infarcts. Hyperdense Left PCA suggests occlusion of that vessel. No hemorrhagic transformation or mass effect. Seen by Neuro and decision made to treat with IV heparin . Echocardiogram shows EF 25 to 30% with LVAD at the apex.  Left atrial is moderately dilate. CT angio distal left P2/P3 occlusion.Repeat CT head 02/06/24 with evolving left cerebellar, left PCA and right frontal gyrus MCA branch infarct. No hemorrhage.  Neurology feels infarcts 2/2 LVAD and signed off  Pt attended [facility] CIR from until 02/18/2024, received ST services targeting cognitive linguistic abilities and discharged at MOD A for cognitive linguistic skills. Memory/orientation deficits remain the pt's biggest barrier to progress and overall safety.   PAIN:  Are you having pain? No   FALLS: Has patient fallen in last 6 months?  No  LIVING ENVIRONMENT: Lives with: lives with their spouse Lives in: House/apartment  PLOF:  Level of assistance: Independent with ADLs, Independent with IADLs Employment: Retired   PATIENT GOALS   to improve functional independence  SUBJECTIVE STATEMENT: Pt pleasant, continued good effort towards all tasks Pt accompanied by: significant other  OBJECTIVE:  TODAY'S TREATMENT:  Pt completed the following PROM:  MULTIFACTORIAL MEMORY QUESTIONNAIRE (MMQ)  Administered patient self-reported outcome measure Multifactorial Memory Questionnaire (MMQ). The Multifactorial Memory Questionnaire [facility]) consists of three scales measuring separate aspects of metamemory;  Satisfaction, Ability and Strategy.   Pt's responses are converted to T-Scores with severity levels based on pt's T-Score.   Severity Levels (T-score) Very Low - < 20 Low - 20 to 29 Below Average - 30-39 Average - 40 to 60 Above Average - 60 to 70 High - 71 to 80 Very High - > 80  Pt reports:  Below Average - 30-39 Memory Satisfaction (T-score: 44) Above Average - 60 to 70 Memory Ability (T-score: 61) Very Low - < 20 use of Memory Strategies (T-score: <20)    Pt with reduced insight into CLOF/current cognitive-communication deficits. Pt, at times, acknowledging deficits, and at other times, stating he doesn't need ST services. Confabulatory responses noted during discussion around PROM. ?reliability to answers on PROM given severity of cognitive-communication impairment.   PATIENT EDUCATION: Education details: see above Person educated: Patient and Spouse Education method: Explanation Education comprehension: verbalized understanding   HOME EXERCISE PROGRAM:   Set alarms on phone TalkPath Therapy, see above   GOALS:  Goals reviewed with patient? Yes  SHORT TERM GOALS: Target date: 10 sessions  With Mod A, pt will utilize external memory aids to answer questions regarding orientation information with 80% accuracy.  Baseline: Goal status: INITIAL  2.  With Mod A, pt will improve word finding by using strategies to produce generative naming  list of equipment and vocabulary related to LVAD.  Baseline:  Goal status: INITIAL  3.  Pt will complete addition cognitive assessment to establish current level of ability.  Baseline: Pt discharged from CIR requiring Mod A for basic cognitive function.  Goal status: INITIAL   LONG TERM GOALS: Target date: 05/17/2024  With supervision A, pt will attend to items on his right to improve safety awareness and accuracy of task completion in 5 out of 7 opportunities.  Baseline:  Goal status: INITIAL  2.  With Min A, pt will  external and internal memory aides to help short term and prospective memory in 5 out of 7 opportunities.  Baseline:  Goal status: INITIAL  3.  With supervision A, pt will be able to verbally sequence (using appropriate vocabulary) LVAD features, alerts and switching battery sources.  Baseline:  Goal status: INITIAL   ASSESSMENT:  CLINICAL IMPRESSION: Patient is a 73 y.o. male LVAD patient, who was seen today for a cognitive communication treatment d/t left cerebellar, left PCA and right frontal gyrus MCA branch infarct. Pt presents with severe cognitive impairment that is c/b by severe deficits in attention, awareness (pt is aware of difficulty/poor performance within tasks but unaware of overall impairment and impact on safety), profound memory deficits in immediate recall, short term memory, retrieval, visual, auditory and prospective memory decreased awareness of confabulatory responses and severe deficits in executive function as observed during visuospatial tasks, specifically task planning, organization, sequencing with pt losing points d/t slow task completion. Overall adequate expressive and receptive communication.   Pt continues to be eager and puts forth great effort. See the above treatment note for details.     OBJECTIVE IMPAIRMENTS include attention, memory, awareness, and executive functioning. These impairments are limiting patient from managing medications, managing appointments, managing finances, household responsibilities, and ADLs/IADLs. Factors affecting potential to achieve goals and functional outcome are ability to learn/carryover information, co-morbidities, medical prognosis, previous level of function, and severity of impairments. Patient will benefit from skilled SLP services to address above impairments and improve overall function.  REHAB POTENTIAL: Good  PLAN: SLP FREQUENCY: 1-2x/week  SLP DURATION: 12 weeks  PLANNED INTERVENTIONS: Language facilitation,  Environmental controls, Cueing hierachy, Cognitive reorganization, Internal/external aids, Functional tasks, SLP instruction and feedback, Compensatory strategies, and Patient/family education  Dia Forget, M.S., CCC-SLP Speech-Language Pathologist [facility] [phone] [place] Clayman)    Luttrell King  [facility] at [facility] [address] [place], [place], [address] Phone: [phone]   Fax:  [phone]  Patient Details  Name: Oden Lindaman MRN: [id number] Date of Birth: 21-Sep-1951 Referring Provider:  Sterling Eisenmenger, PA-C  Encounter Date: 03/09/2024   Adin Honour, CCC-SLP 03/09/2024, 11:04 AM  [facility] Rehabilitation at [facility] [address] [place], [place], [address] Phone: [phone]   Fax:  [phone]

## 2024-03-10 ENCOUNTER — Ambulatory Visit (HOSPITAL_COMMUNITY): Payer: Self-pay | Admitting: Pharmacist

## 2024-03-10 LAB — POCT INR: INR: 1.7 — AB (ref 2.0–3.0)

## 2024-03-10 MED ORDER — WARFARIN SODIUM 2 MG PO TABS: ORAL_TABLET | ORAL | 11 refills | Status: DC

## 2024-03-10 MED ORDER — ENOXAPARIN SODIUM 40 MG/0.4ML IJ SOSY: 40.0000 mg | PREFILLED_SYRINGE | INTRAMUSCULAR | 1 refills | Status: DC

## 2024-03-13 ENCOUNTER — Encounter

## 2024-03-13 ENCOUNTER — Telehealth (HOSPITAL_COMMUNITY): Payer: Self-pay | Admitting: Pharmacist

## 2024-03-13 ENCOUNTER — Ambulatory Visit

## 2024-03-13 NOTE — Telephone Encounter (Signed)
 Called patient to inquire about INR. Patint's wife reports she has not checked INR yet and is not currently at home. She states she will check INR tonight or tomorrow morning. Will follow up tomorrow morning.

## 2024-03-14 ENCOUNTER — Ambulatory Visit: Admitting: Speech Pathology

## 2024-03-14 ENCOUNTER — Ambulatory Visit (HOSPITAL_COMMUNITY): Payer: Self-pay | Admitting: Pharmacist

## 2024-03-14 ENCOUNTER — Ambulatory Visit

## 2024-03-14 DIAGNOSIS — R269 Unspecified abnormalities of gait and mobility: Secondary | ICD-10-CM

## 2024-03-14 DIAGNOSIS — R278 Other lack of coordination: Secondary | ICD-10-CM

## 2024-03-14 DIAGNOSIS — R41841 Cognitive communication deficit: Secondary | ICD-10-CM

## 2024-03-14 DIAGNOSIS — M6281 Muscle weakness (generalized): Secondary | ICD-10-CM

## 2024-03-14 LAB — POCT INR: INR: 2.4 (ref 2.0–3.0)

## 2024-03-14 NOTE — Therapy (Signed)
 OUTPATIENT SPEECH LANGUAGE PATHOLOGY  COGNITIVE COMMUNICATION TREATMENT NOTE   Patient Name: Isaac [name] MRN: [id number] DOB:[birth year], 73 y.o., male Today's Date: 03/14/2024  PCP: Lynwood Null, MD REFERRING PROVIDER: Toribio Pitch, PA   End of Session - 03/14/24 1110     Visit Number 6    Number of Visits 25    Date for SLP Re-Evaluation 05/17/24    Authorization Type [employer] Medicare HMO/PPO    Progress Note Due on Visit 10    SLP Start Time 1110    SLP Stop Time  1145    SLP Time Calculation (min) 35 min    Activity Tolerance Patient tolerated treatment well          Past Medical History:  Diagnosis Date   Arrhythmia    atrial fibrillation   CHF (congestive heart failure) (HCC)    Chronic kidney disease    Coronary artery disease    Hyperlipidemia    Hypertension    Myocardial infarct [facility])    Past Surgical History:  Procedure Laterality Date   CARDIAC DEFIBRILLATOR PLACEMENT  feb 2014   ICD GENERATOR CHANGEOUT N/A 02/09/2024   Procedure: ICD GENERATOR CHANGEOUT;  Surgeon: Waddell Danelle ORN, MD;  Location: [facility] INVASIVE CV LAB;  Service: Cardiovascular;  Laterality: N/A;   INSERTION OF IMPLANTABLE LEFT VENTRICULAR ASSIST DEVICE N/A 09/17/2021   Procedure: INSERTION OF IMPLANTABLE LEFT VENTRICULAR ASSIST DEVICE AND INSERTION OF FEMORAL ARTERIAL LINE;  Surgeon: Lucas Dorise POUR, MD;  Location: [facility] OR;  Service: Open Heart Surgery;  Laterality: N/A;   IR FLUORO GUIDE CV LINE RIGHT  09/27/2021   IR THORACENTESIS ASP PLEURAL SPACE W/IMG GUIDE  09/27/2021   IR US  GUIDE VASC ACCESS RIGHT  09/27/2021   PLACEMENT OF IMPELLA LEFT VENTRICULAR ASSIST DEVICE N/A 09/12/2021   Procedure: PLACEMENT OF IMPELLA 5.5 LEFT VENTRICULAR ASSIST DEVICE;  Surgeon: Kerrin Elspeth BROCKS, MD;  Location: [facility] OR;  Service: Open Heart Surgery;  Laterality: N/A;   RIGHT HEART CATH N/A 09/08/2021   Procedure: RIGHT HEART CATH;  Surgeon: Rolan Ezra RAMAN, MD;  Location: [facility] INVASIVE CV LAB;  Service:  Cardiovascular;  Laterality: N/A;   TEE WITHOUT CARDIOVERSION N/A 09/12/2021   Procedure: TRANSESOPHAGEAL ECHOCARDIOGRAM (TEE);  Surgeon: Kerrin Elspeth BROCKS, MD;  Location: [facility] OR;  Service: Open Heart Surgery;  Laterality: N/A;   TEE WITHOUT CARDIOVERSION N/A 09/17/2021   Procedure: TRANSESOPHAGEAL ECHOCARDIOGRAM (TEE);  Surgeon: Lucas Dorise POUR, MD;  Location: [facility] OR;  Service: Open Heart Surgery;  Laterality: N/A;   Patient Active Problem List   Diagnosis Date Noted   Hematoma 02/17/2024   Anemia 02/15/2024   Hypotension 02/15/2024   Sundowning 02/11/2024   Insomnia 02/11/2024   Constipation 02/11/2024   Stage 3b chronic kidney disease (HCC) 02/11/2024   Cardioembolic stroke (HCC) 02/10/2024   Acute left PCA stroke (HCC) 02/05/2024   Abnormal thyroid  blood test 04/05/2023   Abscess of left groin 11/27/2022   VT (ventricular tachycardia) (HCC) 10/27/2022   ICD (implantable cardioverter-defibrillator) in place 10/27/2022   Pressure injury of skin 10/07/2021   LVAD (left ventricular assist device) present (HCC)    Protein-calorie malnutrition, severe 09/10/2021   Elevated TSH    PICC (peripherally inserted central catheter) in place    Cardiogenic shock (HCC)    Syncope 09/07/2021   CHF (congestive heart failure) (HCC) 09/06/2021   CAD (coronary artery disease) 08/22/2021   Cardiomyopathy (HCC) 08/22/2021   Mixed hyperlipidemia 08/22/2021   A-fib (HCC) 08/22/2021   Elevated troponin 08/22/2021  Acute on chronic combined systolic and diastolic CHF (congestive heart failure) (HCC) 08/22/2021   Benign essential hypertension 02/22/2015   Chronic systolic heart failure (HCC) 07/05/2014    ONSET DATE: 02/05/2024; date of referral  02/22/2024  REFERRING DIAG: P36.467 (ICD-10-CM) - Cerebral infarction due to unspecified occlusion or stenosis of left posterior cerebral artery  THERAPY DIAG:  Cognitive communication deficit  Rationale for Evaluation and Treatment  Rehabilitation  SUBJECTIVE:   PERTINENT HISTORY and DIAGNOSTIC FINDINGS:  Mr. Caraveo is a 73 y.o. with history of CAD, HTN, atrial fibrillation, CKD IV, VT and chronic systolic CHFs/p HM-3 LVAD on 09/17/21.  He presented to the [facility] ED on 02/05/24 for focal neuro deficits with R vision field cut and dysmetria. Code Stroke activated. CT brain positive for acute to subacute appearing Left PCA and Left cerebellar infarcts. Hyperdense Left PCA suggests occlusion of that vessel. No hemorrhagic transformation or mass effect. Seen by Neuro and decision made to treat with IV heparin . Echocardiogram shows EF 25 to 30% with LVAD at the apex.  Left atrial is moderately dilate. CT angio distal left P2/P3 occlusion.Repeat CT head 02/06/24 with evolving left cerebellar, left PCA and right frontal gyrus MCA branch infarct. No hemorrhage.  Neurology feels infarcts 2/2 LVAD and signed off  Pt attended [facility] CIR from until 02/18/2024, received ST services targeting cognitive linguistic abilities and discharged at MOD A for cognitive linguistic skills. Memory/orientation deficits remain the pt's biggest barrier to progress and overall safety.   PAIN:  Are you having pain? No   FALLS: Has patient fallen in last 6 months?  No  LIVING ENVIRONMENT: Lives with: lives with their spouse Lives in: House/apartment  PLOF:  Level of assistance: Independent with ADLs, Independent with IADLs Employment: Retired   PATIENT GOALS   to improve functional independence  SUBJECTIVE STATEMENT: Pt's wife stayed in lobby with their grandson, written information provided via patient for his wife regarding session information Pt accompanied by: significant other  OBJECTIVE:  TODAY'S TREATMENT:   Skilled treatment session focused on pt's cognitive communication goals. SLP facilitated session by providing the following interventions:  Working [employer] fast streamer, alternating attention and scanning were targeted thru  use of Constant Therapy Clinician Alternating Attention Level 2: 80% with moderate faded to supervision level verbal and visual A  SLP reviewed TalkPath Therapy with pt. Per therapy APP pt has been been completing it. Additional more advanced levels added with written information provided on how to refresh app.   Pt reports that he has not set any additional alarms on his phone. Education provided on creating habitual use of external memory aids to promote accurate recall of information. Education also written down for his wife.   PATIENT EDUCATION: Education details: see above Person educated: Patient and Spouse Education method: Explanation Education comprehension: verbalized understanding   HOME EXERCISE PROGRAM:   Set alarms on phone TalkPath Therapy, see above   GOALS:  Goals reviewed with patient? Yes  SHORT TERM GOALS: Target date: 10 sessions  With Mod A, pt will utilize external memory aids to answer questions regarding orientation information with 80% accuracy.  Baseline: Goal status: INITIAL  2.  With Mod A, pt will improve word finding by using strategies to produce generative naming list of equipment and vocabulary related to LVAD.  Baseline:  Goal status: INITIAL  3.  Pt will complete addition cognitive assessment to establish current level of ability.  Baseline: Pt discharged from CIR requiring Mod A for basic cognitive function.  Goal status: INITIAL   LONG TERM GOALS: Target date: 05/17/2024  With supervision A, pt will attend to items on his right to improve safety awareness and accuracy of task completion in 5 out of 7 opportunities.  Baseline:  Goal status: INITIAL  2.  With Min A, pt will external and internal memory aides to help short term and prospective memory in 5 out of 7 opportunities.  Baseline:  Goal status: INITIAL  3.  With supervision A, pt will be able to verbally sequence (using appropriate vocabulary) LVAD features, alerts and  switching battery sources.  Baseline:  Goal status: INITIAL   ASSESSMENT:  CLINICAL IMPRESSION: Patient is a 73 y.o. male LVAD patient, who was seen today for a cognitive communication treatment d/t left cerebellar, left PCA and right frontal gyrus MCA branch infarct. Pt presents with severe cognitive impairment that is c/b by severe deficits in attention, awareness (pt is aware of difficulty/poor performance within tasks but unaware of overall impairment and impact on safety), profound memory deficits in immediate recall, short term memory, retrieval, visual, auditory and prospective memory decreased awareness of confabulatory responses and severe deficits in executive function as observed during visuospatial tasks, specifically task planning, organization, sequencing with pt losing points d/t slow task completion. Overall adequate expressive and receptive communication.   Pt continues to be eager and puts forth great effort. See the above treatment note for details.     OBJECTIVE IMPAIRMENTS include attention, memory, awareness, and executive functioning. These impairments are limiting patient from managing medications, managing appointments, managing finances, household responsibilities, and ADLs/IADLs. Factors affecting potential to achieve goals and functional outcome are ability to learn/carryover information, co-morbidities, medical prognosis, previous level of function, and severity of impairments. Patient will benefit from skilled SLP services to address above impairments and improve overall function.  REHAB POTENTIAL: Good  PLAN: SLP FREQUENCY: 1-2x/week  SLP DURATION: 12 weeks  PLANNED INTERVENTIONS: Language facilitation, Environmental controls, Cueing hierachy, Cognitive reorganization, Internal/external aids, Functional tasks, SLP instruction and feedback, Compensatory strategies, and Patient/family education  Patient Details  Name: Shant Hence MRN: [id number] Date of  Birth: 1951-09-04 Referring Provider:  Pegge Toribio PARAS, PA-C  Encounter Date: 03/14/2024   [name] Bedel B. Rubbie, M.S., CCC-SLP, Tree surgeon Certified Brain Injury Specialist [facility]  [facility] Rehabilitation Services Office [phone] Ascom [phone] Fax [phone]

## 2024-03-14 NOTE — Therapy (Signed)
 OUTPATIENT PHYSICAL THERAPY NEURO TREATMENT    Patient Name: Isaac [name] MRN: [id number] DOB:12-16-1950, 73 y.o., male Today's Date: 03/14/2024   PCP: Valora Agent, MD REFERRING PROVIDER: Pegge Toribio PARAS, PA-C   END OF SESSION:   PT End of Session - 03/14/24 1141     Visit Number 6    Number of Visits 24    Date for PT Re-Evaluation 05/17/24    PT Start Time 1145    PT Stop Time 1226    PT Time Calculation (min) 41 min    Equipment Utilized During Treatment Gait belt    Activity Tolerance Patient tolerated treatment well    Behavior During Therapy WFL for tasks assessed/performed             Past Medical History:  Diagnosis Date   Arrhythmia    atrial fibrillation   CHF (congestive heart failure) (HCC)    Chronic kidney disease    Coronary artery disease    Hyperlipidemia    Hypertension    Myocardial infarct [facility])    Past Surgical History:  Procedure Laterality Date   CARDIAC DEFIBRILLATOR PLACEMENT  feb 2014   ICD GENERATOR CHANGEOUT N/A 02/09/2024   Procedure: ICD GENERATOR CHANGEOUT;  Surgeon: Waddell Danelle ORN, MD;  Location: [facility] INVASIVE CV LAB;  Service: Cardiovascular;  Laterality: N/A;   INSERTION OF IMPLANTABLE LEFT VENTRICULAR ASSIST DEVICE N/A 09/17/2021   Procedure: INSERTION OF IMPLANTABLE LEFT VENTRICULAR ASSIST DEVICE AND INSERTION OF FEMORAL ARTERIAL LINE;  Surgeon: Lucas Dorise POUR, MD;  Location: [facility] OR;  Service: Open Heart Surgery;  Laterality: N/A;   IR FLUORO GUIDE CV LINE RIGHT  09/27/2021   IR THORACENTESIS ASP PLEURAL SPACE W/IMG GUIDE  09/27/2021   IR US  GUIDE VASC ACCESS RIGHT  09/27/2021   PLACEMENT OF IMPELLA LEFT VENTRICULAR ASSIST DEVICE N/A 09/12/2021   Procedure: PLACEMENT OF IMPELLA 5.5 LEFT VENTRICULAR ASSIST DEVICE;  Surgeon: Kerrin Elspeth BROCKS, MD;  Location: [facility] OR;  Service: Open Heart Surgery;  Laterality: N/A;   RIGHT HEART CATH N/A 09/08/2021   Procedure: RIGHT HEART CATH;  Surgeon: Rolan Ezra RAMAN, MD;  Location:  [facility] INVASIVE CV LAB;  Service: Cardiovascular;  Laterality: N/A;   TEE WITHOUT CARDIOVERSION N/A 09/12/2021   Procedure: TRANSESOPHAGEAL ECHOCARDIOGRAM (TEE);  Surgeon: Kerrin Elspeth BROCKS, MD;  Location: [facility] OR;  Service: Open Heart Surgery;  Laterality: N/A;   TEE WITHOUT CARDIOVERSION N/A 09/17/2021   Procedure: TRANSESOPHAGEAL ECHOCARDIOGRAM (TEE);  Surgeon: Lucas Dorise POUR, MD;  Location: [facility] OR;  Service: Open Heart Surgery;  Laterality: N/A;   Patient Active Problem List   Diagnosis Date Noted   Hematoma 02/17/2024   Anemia 02/15/2024   Hypotension 02/15/2024   Sundowning 02/11/2024   Insomnia 02/11/2024   Constipation 02/11/2024   Stage 3b chronic kidney disease (HCC) 02/11/2024   Cardioembolic stroke (HCC) 02/10/2024   Acute left PCA stroke (HCC) 02/05/2024   Abnormal thyroid  blood test 04/05/2023   Abscess of left groin 11/27/2022   VT (ventricular tachycardia) (HCC) 10/27/2022   ICD (implantable cardioverter-defibrillator) in place 10/27/2022   Pressure injury of skin 10/07/2021   LVAD (left ventricular assist device) present (HCC)    Protein-calorie malnutrition, severe 09/10/2021   Elevated TSH    PICC (peripherally inserted central catheter) in place    Cardiogenic shock (HCC)    Syncope 09/07/2021   CHF (congestive heart failure) (HCC) 09/06/2021   CAD (coronary artery disease) 08/22/2021   Cardiomyopathy (HCC) 08/22/2021   Mixed hyperlipidemia 08/22/2021  A-fib (HCC) 08/22/2021   Elevated troponin 08/22/2021   Acute on chronic combined systolic and diastolic CHF (congestive heart failure) (HCC) 08/22/2021   Benign essential hypertension 02/22/2015   Chronic systolic heart failure (HCC) 07/05/2014    ONSET DATE: 02/05/24  REFERRING DIAG:  P36.467 (ICD-10-CM) - Cerebral infarction due to unspecified occlusion or stenosis of left posterior cerebral artery   THERAPY DIAG:  Muscle weakness (generalized)  Other lack of coordination  Abnormality of  gait  Rationale for Evaluation and Treatment: Rehabilitation  SUBJECTIVE:                                                                                                                                                                                             SUBJECTIVE STATEMENT:  Pt reports that previous session was challenging but denies soreness and any trips/falls.   Pt accompanied by: significant other, [name]  PERTINENT HISTORY:  Presented 02/05/2024 when his pacemaker went off abruptly resulting in a fall as well as noted right sided weakness and aphasia with blurred vision. Cranial CT scan positive for acute to subacute appearing left PCA left cerebellar infarction. Hyperdense left PCA suggesting occlusion of that vessel. He was also diagnosed with right frontal gyrus MCA infarction. No hemorrhagic transformation or mass effect. Underlying advanced chronic small vessel disease. Right supraorbital forehead recent soft tissue injury with no skull fracture identified. CTA head and neck demonstrating subtotal occlusion of the left PCA in the P2/P3 junction and additionally moderate to severe bilateral ICA siphon atherosclerosis with a least moderate siphon stenosis bilaterally.  Admitted to  rehab 02/10/2024 for inpatient therapies to consist of PT, ST and OT at least three hours five days a week. Past admission physiatrist, therapy team and rehab RN have worked together to provide customized collaborative inpatient rehab.  Pertaining to patient's cardioembolic left cerebellar, PCA and right frontal MCA branch infarction secondary to LVAD device despite being on anticoagulation with warfarin.  Patient remained on Coumadin  therapy goal INR 2.5-3.0.  Atrial fibrillation/VT chronic systolic congestive heart failure status post LVAD 09/17/2021.  Continued Revatio  followed by heart failure team status post generator change 02/09/2024.   PAIN:  Are you having pain? No  PRECAUTIONS: ICD/Pacemaker  and Other: LVAD, latex allergy   RED FLAGS: None and LVAD   WEIGHT BEARING RESTRICTIONS: LVAD and pace maker.   FALLS: Has patient fallen in last 6 months? No and pt unsure   LIVING ENVIRONMENT: Lives with: lives with their spouse Lives in: House/apartment Stairs: Yes: Internal: 9 steps; on right going up Has following equipment at home: Single point cane, Walker - 2 wheeled, and does  not use  PLOF: Independent with basic ADLs and Independent with household mobility without device  PATIENT GOALS: pt reports that sight in the L is limited.  Improve balance slightly.   OBJECTIVE:  Note: Objective measures were completed at Evaluation unless otherwise noted.  DIAGNOSTIC FINDINGS:  CT head: IMPRESSION: 1. Expected evolution of Left cerebellar and Left PCA infarcts since yesterday. No hemorrhage or mass effect. 2. Evolution also of a Right MCA middle frontal gyrus infarct predicted by abnormal CTP T-max yesterday. No hemorrhage or mass effect. 3. Consider a recent Embolic event given the recent anterior and posterior vascular territory infarcts.  CT neck  IMPRESSION: 1. CTP does not detect the known Left PCA infarct core (although T-max oligemia corresponds to that finding), while CTA demonstrates subtotal occlusion of the Left PCA at the P2/P3 junction (some distal flow).   2. CTP also suggests Oligemia in the anterior Right MCA division, although no MCA branch occlusion identified by CTA.   3. The above were discussed by telephone with Dr. Rosemarie on 02/05/2024 at 12:36 .   4. Additionally, Moderate to Severe bilateral ICA siphon atherosclerosis with at least Moderate siphon stenosis bilaterally (Moderate to Severe at the Right anterior genu).   5. Extracranial atherosclerosis but no significant Vertebral artery or extracranial carotid stenosis.   6. Aortic Atherosclerosis (ICD10-I70.0) and Emphysema (ICD10-J43.9).  DG chest FINDINGS: Single frontal view of the  chest demonstrates stable left ventricular assist device and multi lead pacer/AICD. Cardiac silhouette remains enlarged. No acute airspace disease, effusion, or pneumothorax. No acute bony abnormalities.     COGNITION: Overall cognitive status: delayed response to history questions    SENSATION: WFL  COORDINATION:   Decreased speed of movement on the RLE>   POSTURE: forward head  LOWER EXTREMITY ROM:     AROM. Grossly WFL.   LOWER EXTREMITY MMT:    MMT Right Eval Left Eval  Hip flexion 4+ 4+  Hip extension    Hip abduction 5 5  Hip adduction 5 5  Hip internal rotation    Hip external rotation    Knee flexion 5 5  Knee extension 5 5  Ankle dorsiflexion 4+ 4+  Ankle plantarflexion    Ankle inversion    Ankle eversion    (Blank rows = not tested)  BED MOBILITY:  Not tested  TRANSFERS: Sit to stand: Complete Independence  Assistive device utilized: None     Stand to sit: Complete Independence  Assistive device utilized: None     Chair to chair: SBA  Assistive device utilized: None       RAMP:  Not tested  CURB:  Findings: CGA 1 UE support   STAIRS: Findings: Level of Assistance: SBA, Stair Negotiation Technique: Step to Pattern with Single Rail on Right Bilateral Rails, Number of Stairs: 8, Height of Stairs: 6   , and Comments: step through ascent step to descent  GAIT: Findings: Gait Characteristics: abducted- Right, Distance walked: 60, Assistive device utilized:None, Level of assistance: SBA and CGA, and Comments: mild adduction on the RLE.   FUNCTIONAL TESTS:  5 times sit to stand: 17.14 Timed up and go (TUG): 13.82 sec no AD 6 minute walk test: to be completed  10 meter walk test: 1.60m/s Berg Balance Scale: To be completed.  Dynamic Gait Index: 18 FGA: 21  PATIENT SURVEYS:  Stroke Impact Scale 71%  TREATMENT DATE:  03/14/2024  Unless otherwise stated, CGA was provided and gait belt donned in order to ensure pt safety throughout session.  Neuro Re-ed:  ambulate in hallway: -horizontal head turns with cues for reading alphabet from cards 86 ftx 2 sets -horizontal head turns with cues for reading number and symbols from cards 86 ft x 2 sets   Blaze pods: - Activity Description: 6 pods in a line, pt side-steps to lit up pod, R foot taps on green L foot taps on red Activity Setting:  The Blaze Pod Random setting was chosen to enhance cognitive processing and agility, providing an unpredictable environment to simulate real-world scenarios, and fostering quick reactions and adaptability.  Number of Pods:  6 Cycles/Sets:  2 Duration (Time or Hit Count):  1:00  Patient Stats  Hits:   Round 1: 13 hits (3 incorrect) Round 2:  16 hits (7 incorrect)    -Activity Description: 5 pods in a circle with one in the middle as home base Activity Setting:  The [facility] setting was selected for the goal of improving spatial awareness and visual scanning ability, establishing a central point of reference during dynamic movements for enhanced balance and control.  Number of Pods:  6 Cycles/Sets:  3 Duration (Time or Hit Count):  1:00  Patient Stats  Hits:   Round 1: 6 hits Round 2: 10 hits  Round 3: 11 hits  Weighted gait with 4# AW with cognitive demand to name foods in alphabetical order  111ft laps in clinic/hallway x5  Ther-Ex:  Step-over 2 orange hurdles with 4# AW x10 forward/backward- pt with difficulty with R heel catching on hurdle  Step-over 2 orange hurdles with 4# AW x10 side-stepping- pt rates difficulty as medium  Step-ups on 6 step with 4# AW 2x10 for reciprocal step pattern- pt rates difficulty as medium  STS x10 from standard height chair  PATIENT EDUCATION: Education details: POC. Pt educated throughout session about proper posture and technique with exercises. Improved exercise  technique, movement at target joints, use of target muscles after min to mod verbal, visual, tactile cues.  Person educated: Patient Education method: Explanation Education comprehension: verbalized understanding  HOME EXERCISE PROGRAM:  Access Code: [email or web address] URL: [email or web address] Date: 03/02/2024 Prepared by: Connell Kiss  Exercises - Tandem Stance with Support  - 1 x daily - 4 x weekly - 3 sets - 4 reps - 20 seconds  hold - Forward and Backward Walking with Eyes Closed and Counter Support  - 1 x daily - 4 x weekly - 3 sets - 5 reps - Side Stepping with Resistance at Thighs and Counter Support  - 1 x daily - 7 x weekly - 2 sets - 10 reps - Forward Step Up with Unilateral Counter Support  - 1 x daily - 7 x weekly - 1-2 sets - 10 reps - Walking  - 3 x daily - 7 x weekly - 1 sets - 6 minutes hold  GOALS: Goals reviewed with patient? Yes  SHORT TERM GOALS: Target date: 03/29/2024    Patient will be independent in home exercise program to improve strength/mobility for better functional independence with ADLs. Baseline: provided on 6/4 Goal status: INITIAL   LONG TERM GOALS: Target date: 05/17/2024    Patient will increase SIS 16 score to equal to or greater than  10 points.    to demonstrate statistically significant improvement in mobility and quality of life.  Baseline: 71% Goal status: INITIAL  2.  Patient (> 17 years old) will complete five times sit to stand test in < 15 seconds indicating an increased LE strength and improved balance. Baseline: 17.14sec Goal status: INITIAL  3.  Patient will increase Berg Balance score by > 6 points to demonstrate decreased fall risk during functional activities Baseline: to be completed  Goal status: INITIAL   4.  Patient will reduce timed up and go to <11 seconds to reduce fall risk and demonstrate improved transfer/gait ability. Baseline: 13.82 seconds  Goal status: INITIAL  5.  Patient will increase FGA   score to >/24 as to demonstrate reduced fall risk and improved dynamic gait balance for better safety with community/home ambulation.   Baseline: 21 Goal status: INITIAL  6.  Patient will increase 6 min walk test >166ft to demonstrate decreased fall risk during functional activities as well as improved access to community mobility.  Baseline: to be completed ; 02/29/24: 1165' Goal status: INITIAL   ASSESSMENT:  CLINICAL IMPRESSION:   Pt responded well to progressions in LE strengthening and dual-tasking activities. Pt with instances of R foot/heel catching during tasks requiring increased foot clearance. Pt had increased difficulty with dual-task walking in the hallway, as evidenced by a notable decreased cadence and tendency to veer from assigned verbal task while walking, requiring multiple re-directions to task throughout. Pt remained motivated throughout session to improve his LE strength and overall mobility status. Pt will benefit from continued skilled PT to allow return to PLOF and increased overall QoL.    OBJECTIVE IMPAIRMENTS: Abnormal gait, cardiopulmonary status limiting activity, decreased activity tolerance, decreased balance, decreased coordination, decreased endurance, decreased mobility, difficulty walking, decreased strength, and improper body mechanics.   ACTIVITY LIMITATIONS: carrying, lifting, squatting, stairs, transfers, locomotion level, and caring for others  PARTICIPATION LIMITATIONS: cleaning, laundry, driving, shopping, community activity, occupation, and yard work  PERSONAL FACTORS: Age and 3+ comorbidities: LVAD, Pacemaker, hypotension  are also affecting patient's functional outcome.   REHAB POTENTIAL: Excellent  CLINICAL DECISION MAKING: Stable/uncomplicated  EVALUATION COMPLEXITY: Low  PLAN:  PT FREQUENCY: 1-2x/week  PT DURATION: 12 weeks  PLANNED INTERVENTIONS: [id number]- PT Re-evaluation, 97750- Physical Performance Testing, 97110-Therapeutic  exercises, 97530- Therapeutic activity, 97112- Neuromuscular re-education, 97535- Self Care, [id number]- Manual therapy, [phone]- Gait training, Patient/Family education, Balance training, Stair training, Taping, DME instructions, Cryotherapy, and Moist heat  PLAN FOR NEXT SESSION:   - higher level dynamic balance challenges - R LE strengthening - higher level dynamic gait challenges   - include unstable surfaces, stepping over obstacles, dual-tasks for cognitive/speech challenges - balance with reduced BOS  Continue progressing  Quasim Doyon, SPT  This entire session was performed under direct supervision and direction of a licensed therapist/therapist assistant . I have personally read, edited and approve of the note as written.  Marina  Leopoldo PT, DPT Physical Therapist - [facility]  Outpatient Physical Therapy- Main Campus [phone]    2:54 PM 03/14/24

## 2024-03-15 ENCOUNTER — Ambulatory Visit

## 2024-03-15 ENCOUNTER — Ambulatory Visit: Admitting: Speech Pathology

## 2024-03-15 ENCOUNTER — Encounter: Admitting: Occupational Therapy

## 2024-03-15 DIAGNOSIS — R2689 Other abnormalities of gait and mobility: Secondary | ICD-10-CM

## 2024-03-15 DIAGNOSIS — R278 Other lack of coordination: Secondary | ICD-10-CM

## 2024-03-15 DIAGNOSIS — R269 Unspecified abnormalities of gait and mobility: Secondary | ICD-10-CM | POA: Diagnosis not present

## 2024-03-15 DIAGNOSIS — M6281 Muscle weakness (generalized): Secondary | ICD-10-CM

## 2024-03-15 DIAGNOSIS — R41841 Cognitive communication deficit: Secondary | ICD-10-CM

## 2024-03-15 NOTE — Therapy (Signed)
 OUTPATIENT SPEECH LANGUAGE PATHOLOGY  COGNITIVE COMMUNICATION TREATMENT NOTE   Isaac [name] Name: Isaac [name] MRN: [id number] DOB:July 28, 1951, 73 y.o., male Today's Date: 03/15/2024  PCP: Lynwood Null, MD REFERRING PROVIDER: Toribio Pitch, PA   End of Session - 03/15/24 1452     Visit Number 7    Number of Visits 25    Date for SLP Re-Evaluation 05/17/24    Authorization Type [employer] HMO/PPO    Progress Note Due on Visit 10    SLP Start Time 1450    SLP Stop Time  1530    SLP Time Calculation (min) 40 min    Activity Tolerance Isaac [name] tolerated treatment well          Past Medical History:  Diagnosis Date   Arrhythmia    atrial fibrillation   CHF (congestive heart failure) (HCC)    Chronic kidney disease    Coronary artery disease    Hyperlipidemia    Hypertension    Myocardial infarct [facility])    Past Surgical History:  Procedure Laterality Date   CARDIAC DEFIBRILLATOR PLACEMENT  feb 2014   ICD GENERATOR CHANGEOUT N/A 02/09/2024   Procedure: ICD GENERATOR CHANGEOUT;  Surgeon: Waddell Danelle ORN, MD;  Location: [facility] INVASIVE CV LAB;  Service: Cardiovascular;  Laterality: N/A;   INSERTION OF IMPLANTABLE LEFT VENTRICULAR ASSIST DEVICE N/A 09/17/2021   Procedure: INSERTION OF IMPLANTABLE LEFT VENTRICULAR ASSIST DEVICE AND INSERTION OF FEMORAL ARTERIAL LINE;  Surgeon: Lucas Dorise POUR, MD;  Location: [facility] OR;  Service: Open Heart Surgery;  Laterality: N/A;   IR FLUORO GUIDE CV LINE RIGHT  09/27/2021   IR THORACENTESIS ASP PLEURAL SPACE W/IMG GUIDE  09/27/2021   IR US  GUIDE VASC ACCESS RIGHT  09/27/2021   PLACEMENT OF IMPELLA LEFT VENTRICULAR ASSIST DEVICE N/A 09/12/2021   Procedure: PLACEMENT OF IMPELLA 5.5 LEFT VENTRICULAR ASSIST DEVICE;  Surgeon: Kerrin Elspeth BROCKS, MD;  Location: [facility] OR;  Service: Open Heart Surgery;  Laterality: N/A;   RIGHT HEART CATH N/A 09/08/2021   Procedure: RIGHT HEART CATH;  Surgeon: Rolan Ezra RAMAN, MD;  Location: [facility] INVASIVE CV LAB;  Service:  Cardiovascular;  Laterality: N/A;   TEE WITHOUT CARDIOVERSION N/A 09/12/2021   Procedure: TRANSESOPHAGEAL ECHOCARDIOGRAM (TEE);  Surgeon: Kerrin Elspeth BROCKS, MD;  Location: [facility] OR;  Service: Open Heart Surgery;  Laterality: N/A;   TEE WITHOUT CARDIOVERSION N/A 09/17/2021   Procedure: TRANSESOPHAGEAL ECHOCARDIOGRAM (TEE);  Surgeon: Lucas Dorise POUR, MD;  Location: [facility] OR;  Service: Open Heart Surgery;  Laterality: N/A;   Isaac [name] Active Problem List   Diagnosis Date Noted   Hematoma 02/17/2024   Anemia 02/15/2024   Hypotension 02/15/2024   Sundowning 02/11/2024   Insomnia 02/11/2024   Constipation 02/11/2024   Stage 3b chronic kidney disease (HCC) 02/11/2024   Cardioembolic stroke (HCC) 02/10/2024   Acute left PCA stroke (HCC) 02/05/2024   Abnormal thyroid  blood test 04/05/2023   Abscess of left groin 11/27/2022   VT (ventricular tachycardia) (HCC) 10/27/2022   ICD (implantable cardioverter-defibrillator) in place 10/27/2022   Pressure injury of skin 10/07/2021   LVAD (left ventricular assist device) present (HCC)    Protein-calorie malnutrition, severe 09/10/2021   Elevated TSH    PICC (peripherally inserted central catheter) in place    Cardiogenic shock (HCC)    Syncope 09/07/2021   CHF (congestive heart failure) (HCC) 09/06/2021   CAD (coronary artery disease) 08/22/2021   Cardiomyopathy (HCC) 08/22/2021   Mixed hyperlipidemia 08/22/2021   A-fib (HCC) 08/22/2021   Elevated troponin 08/22/2021  Acute on chronic combined systolic and diastolic CHF (congestive heart failure) (HCC) 08/22/2021   Benign essential hypertension 02/22/2015   Chronic systolic heart failure (HCC) 07/05/2014    ONSET DATE: 02/05/2024; date of referral  02/22/2024  REFERRING DIAG: P36.467 (ICD-10-CM) - Cerebral infarction due to unspecified occlusion or stenosis of left posterior cerebral artery  THERAPY DIAG:  Cognitive communication deficit  Rationale for Evaluation and Treatment  Rehabilitation  SUBJECTIVE:   PERTINENT HISTORY and DIAGNOSTIC FINDINGS:  Isaac [name] is a 73 y.o. with history of CAD, HTN, atrial fibrillation, CKD IV, VT and chronic systolic CHFs/p HM-3 LVAD on 09/17/21.  Isaac [name] presented to the [facility] ED on 02/05/24 for focal neuro deficits with R vision field cut and dysmetria. Code Stroke activated. CT brain positive for acute to subacute appearing Left PCA and Left cerebellar infarcts. Hyperdense Left PCA suggests occlusion of that vessel. No hemorrhagic transformation or mass effect. Seen by Neuro and decision made to treat with IV heparin . Echocardiogram shows EF 25 to 30% with LVAD at the apex.  Left atrial is moderately dilate. CT angio distal left P2/P3 occlusion.Repeat CT head 02/06/24 with evolving left cerebellar, left PCA and right frontal gyrus MCA branch infarct. No hemorrhage.  Neurology feels infarcts 2/2 LVAD and signed off  Isaac [name] attended [facility] CIR from until 02/18/2024, received ST services targeting cognitive linguistic abilities and discharged at MOD A for cognitive linguistic skills. Memory/orientation deficits remain the Isaac biggest barrier to progress and overall safety.   PAIN:  Are you having pain? No   FALLS: Has Isaac [name] fallen in last 6 months?  No  LIVING ENVIRONMENT: Lives with: lives with their spouse Lives in: House/apartment  PLOF:  Level of assistance: Independent with ADLs, Independent with IADLs Employment: Retired   Isaac [name] GOALS   to improve functional independence  SUBJECTIVE STATEMENT: Isaac [name] stayed in lobby with their grandson, Isaac [name] eager to participate, continued good effort  Isaac [name] accompanied by: significant other  OBJECTIVE:  TODAY'S TREATMENT:   Skilled treatment session focused on Isaac cognitive communication goals. SLP facilitated session by providing the following interventions:  When completing semi-complex visual scanning problem solving task, Isaac [name] benefited from maximal faded to  moderate verbal and visual cues specifically in the areas of working memory, recall of rules of game  Isaac [name] was also seen yesterday for ST and Isaac [name] services and benefited from maximal cues to recognize same Isaac [name] as previous day, uncertain that Isaac [name] understood treating Isaac [name] was same as previous day - Isaac [name] also with incorrect recall stating that Isaac [name] had therapy three times this week    Isaac [name] EDUCATION: Education details: see above Person educated: Isaac [name] and Spouse Education method: Explanation Education comprehension: verbalized understanding   HOME EXERCISE PROGRAM:   Set alarms on phone [facility], see above   GOALS:  Goals reviewed with Isaac [name]? Yes  SHORT TERM GOALS: Target date: 10 sessions  With Mod A, Isaac [name] will utilize external memory aids to answer questions regarding orientation information with 80% accuracy.  Baseline: Goal status: INITIAL  2.  With Mod A, Isaac [name] will improve word finding by using strategies to produce generative naming list of equipment and vocabulary related to LVAD.  Baseline:  Goal status: INITIAL  3.  Isaac [name] will complete addition cognitive assessment to establish current level of ability.  Baseline: Isaac [name] discharged from CIR requiring Mod A for basic cognitive function.  Goal status: INITIAL   LONG TERM GOALS: Target date: 05/17/2024  With supervision A, Isaac [name] will attend  to items on his right to improve safety awareness and accuracy of task completion in 5 out of 7 opportunities.  Baseline:  Goal status: INITIAL  2.  With Min A, Isaac [name] will external and internal memory aides to help short term and prospective memory in 5 out of 7 opportunities.  Baseline:  Goal status: INITIAL  3.  With supervision A, Isaac [name] will be able to verbally sequence (using appropriate vocabulary) LVAD features, alerts and switching battery sources.  Baseline:  Goal status: INITIAL   ASSESSMENT:  CLINICAL IMPRESSION: Isaac [name] is a 73 y.o. male LVAD Isaac [name], who was seen today for a  cognitive communication treatment d/t left cerebellar, left PCA and right frontal gyrus MCA branch infarct. Isaac [name] presents with severe cognitive impairment that is c/b by severe deficits in attention, awareness (Isaac [name] is aware of difficulty/poor performance within tasks but unaware of overall impairment and impact on safety), profound memory deficits in immediate recall, short term memory, retrieval, visual, auditory and prospective memory decreased awareness of confabulatory responses and severe deficits in executive function as observed during visuospatial tasks, specifically task planning, organization, sequencing with Isaac [name] losing points d/t slow task completion. Overall adequate expressive and receptive communication.   Isaac [name] continues to be eager and puts forth great effort. See the above treatment note for details.     OBJECTIVE IMPAIRMENTS include attention, memory, awareness, and executive functioning. These impairments are limiting Isaac [name] from managing medications, managing appointments, managing finances, household responsibilities, and ADLs/IADLs. Factors affecting potential to achieve goals and functional outcome are ability to learn/carryover information, co-morbidities, medical prognosis, previous level of function, and severity of impairments. Isaac [name] will benefit from skilled SLP services to address above impairments and improve overall function.  REHAB POTENTIAL: Good  PLAN: SLP FREQUENCY: 1-2x/week  SLP DURATION: 12 weeks  PLANNED INTERVENTIONS: Language facilitation, Environmental controls, Cueing hierachy, Cognitive reorganization, Internal/external aids, Functional tasks, SLP instruction and feedback, Compensatory strategies, and Isaac [name]/family education  Isaac [name] Details  Name: Kei Mcelhiney MRN: [id number] Date of Birth: [birth year] Referring Provider:  Pegge Toribio PARAS, PA-C  Encounter Date: 03/15/2024   Carena Stream B. Rubbie, M.S., CCC-SLP, Research officer, trade union Certified Brain Injury Specialist [facility]  [facility] Rehabilitation Services Office [phone] Ascom [phone] Fax [phone]

## 2024-03-15 NOTE — Therapy (Signed)
 OUTPATIENT PHYSICAL THERAPY NEURO TREATMENT    Patient Name: Isaac [name] MRN: [id number] DOB:1951/05/07, 73 y.o., male Today's Date: 03/15/2024   PCP: Valora Agent, MD REFERRING PROVIDER: Pegge Toribio PARAS, PA-C   END OF SESSION:   PT End of Session - 03/15/24 1450     Visit Number 7    Number of Visits 24    Date for PT Re-Evaluation 05/17/24    PT Start Time 1527    PT Stop Time 1608    PT Time Calculation (min) 41 min    Equipment Utilized During Treatment Gait belt    Activity Tolerance Patient tolerated treatment well    Behavior During Therapy WFL for tasks assessed/performed              Past Medical History:  Diagnosis Date   Arrhythmia    atrial fibrillation   CHF (congestive heart failure) (HCC)    Chronic kidney disease    Coronary artery disease    Hyperlipidemia    Hypertension    Myocardial infarct [facility])    Past Surgical History:  Procedure Laterality Date   CARDIAC DEFIBRILLATOR PLACEMENT  feb 2014   ICD GENERATOR CHANGEOUT N/A 02/09/2024   Procedure: ICD GENERATOR CHANGEOUT;  Surgeon: Waddell Danelle ORN, MD;  Location: [facility] INVASIVE CV LAB;  Service: Cardiovascular;  Laterality: N/A;   INSERTION OF IMPLANTABLE LEFT VENTRICULAR ASSIST DEVICE N/A 09/17/2021   Procedure: INSERTION OF IMPLANTABLE LEFT VENTRICULAR ASSIST DEVICE AND INSERTION OF FEMORAL ARTERIAL LINE;  Surgeon: Lucas Dorise POUR, MD;  Location: [facility] OR;  Service: Open Heart Surgery;  Laterality: N/A;   IR FLUORO GUIDE CV LINE RIGHT  09/27/2021   IR THORACENTESIS ASP PLEURAL SPACE W/IMG GUIDE  09/27/2021   IR US  GUIDE VASC ACCESS RIGHT  09/27/2021   PLACEMENT OF IMPELLA LEFT VENTRICULAR ASSIST DEVICE N/A 09/12/2021   Procedure: PLACEMENT OF IMPELLA 5.5 LEFT VENTRICULAR ASSIST DEVICE;  Surgeon: Kerrin Elspeth BROCKS, MD;  Location: [facility] OR;  Service: Open Heart Surgery;  Laterality: N/A;   RIGHT HEART CATH N/A 09/08/2021   Procedure: RIGHT HEART CATH;  Surgeon: Rolan Ezra RAMAN, MD;   Location: [facility] INVASIVE CV LAB;  Service: Cardiovascular;  Laterality: N/A;   TEE WITHOUT CARDIOVERSION N/A 09/12/2021   Procedure: TRANSESOPHAGEAL ECHOCARDIOGRAM (TEE);  Surgeon: Kerrin Elspeth BROCKS, MD;  Location: [facility] OR;  Service: Open Heart Surgery;  Laterality: N/A;   TEE WITHOUT CARDIOVERSION N/A 09/17/2021   Procedure: TRANSESOPHAGEAL ECHOCARDIOGRAM (TEE);  Surgeon: Lucas Dorise POUR, MD;  Location: [facility] OR;  Service: Open Heart Surgery;  Laterality: N/A;   Patient Active Problem List   Diagnosis Date Noted   Hematoma 02/17/2024   Anemia 02/15/2024   Hypotension 02/15/2024   Sundowning 02/11/2024   Insomnia 02/11/2024   Constipation 02/11/2024   Stage 3b chronic kidney disease (HCC) 02/11/2024   Cardioembolic stroke (HCC) 02/10/2024   Acute left PCA stroke (HCC) 02/05/2024   Abnormal thyroid  blood test 04/05/2023   Abscess of left groin 11/27/2022   VT (ventricular tachycardia) (HCC) 10/27/2022   ICD (implantable cardioverter-defibrillator) in place 10/27/2022   Pressure injury of skin 10/07/2021   LVAD (left ventricular assist device) present (HCC)    Protein-calorie malnutrition, severe 09/10/2021   Elevated TSH    PICC (peripherally inserted central catheter) in place    Cardiogenic shock (HCC)    Syncope 09/07/2021   CHF (congestive heart failure) (HCC) 09/06/2021   CAD (coronary artery disease) 08/22/2021   Cardiomyopathy (HCC) 08/22/2021   Mixed hyperlipidemia 08/22/2021  A-fib (HCC) 08/22/2021   Elevated troponin 08/22/2021   Acute on chronic combined systolic and diastolic CHF (congestive heart failure) (HCC) 08/22/2021   Benign essential hypertension 02/22/2015   Chronic systolic heart failure (HCC) 07/05/2014    ONSET DATE: 02/05/24  REFERRING DIAG:  P36.467 (ICD-10-CM) - Cerebral infarction due to unspecified occlusion or stenosis of left posterior cerebral artery   THERAPY DIAG:  Muscle weakness (generalized)  Abnormality of gait  Other lack of  coordination  Imbalance  Rationale for Evaluation and Treatment: Rehabilitation  SUBJECTIVE:                                                                                                                                                                                             SUBJECTIVE STATEMENT:  Pt report he feels good today. No pain or soreness after previous session.  Pt accompanied by: significant other, [name]  PERTINENT HISTORY:  Presented 02/05/2024 when his pacemaker went off abruptly resulting in a fall as well as noted right sided weakness and aphasia with blurred vision. Cranial CT scan positive for acute to subacute appearing left PCA left cerebellar infarction. Hyperdense left PCA suggesting occlusion of that vessel. He was also diagnosed with right frontal gyrus MCA infarction. No hemorrhagic transformation or mass effect. Underlying advanced chronic small vessel disease. Right supraorbital forehead recent soft tissue injury with no skull fracture identified. CTA head and neck demonstrating subtotal occlusion of the left PCA in the P2/P3 junction and additionally moderate to severe bilateral ICA siphon atherosclerosis with a least moderate siphon stenosis bilaterally.  Admitted to  rehab 02/10/2024 for inpatient therapies to consist of PT, ST and OT at least three hours five days a week. Past admission physiatrist, therapy team and rehab RN have worked together to provide customized collaborative inpatient rehab.  Pertaining to patient's cardioembolic left cerebellar, PCA and right frontal MCA branch infarction secondary to LVAD device despite being on anticoagulation with warfarin.  Patient remained on Coumadin  therapy goal INR 2.5-3.0.  Atrial fibrillation/VT chronic systolic congestive heart failure status post LVAD 09/17/2021.  Continued Revatio  followed by heart failure team status post generator change 02/09/2024.   PAIN:  Are you having pain? No  PRECAUTIONS:  ICD/Pacemaker and Other: LVAD, latex allergy   RED FLAGS: None and LVAD   WEIGHT BEARING RESTRICTIONS: LVAD and pace maker.   FALLS: Has patient fallen in last 6 months? No and pt unsure   LIVING ENVIRONMENT: Lives with: lives with their spouse Lives in: House/apartment Stairs: Yes: Internal: 9 steps; on right going up Has following equipment at home: Single point cane, Walker - 2 wheeled, and  does not use  PLOF: Independent with basic ADLs and Independent with household mobility without device  PATIENT GOALS: pt reports that sight in the L is limited.  Improve balance slightly.   OBJECTIVE:  Note: Objective measures were completed at Evaluation unless otherwise noted.  DIAGNOSTIC FINDINGS:  CT head: IMPRESSION: 1. Expected evolution of Left cerebellar and Left PCA infarcts since yesterday. No hemorrhage or mass effect. 2. Evolution also of a Right MCA middle frontal gyrus infarct predicted by abnormal CTP T-max yesterday. No hemorrhage or mass effect. 3. Consider a recent Embolic event given the recent anterior and posterior vascular territory infarcts.  CT neck  IMPRESSION: 1. CTP does not detect the known Left PCA infarct core (although T-max oligemia corresponds to that finding), while CTA demonstrates subtotal occlusion of the Left PCA at the P2/P3 junction (some distal flow).   2. CTP also suggests Oligemia in the anterior Right MCA division, although no MCA branch occlusion identified by CTA.   3. The above were discussed by telephone with Dr. Rosemarie on 02/05/2024 at 12:36 .   4. Additionally, Moderate to Severe bilateral ICA siphon atherosclerosis with at least Moderate siphon stenosis bilaterally (Moderate to Severe at the Right anterior genu).   5. Extracranial atherosclerosis but no significant Vertebral artery or extracranial carotid stenosis.   6. Aortic Atherosclerosis (ICD10-I70.0) and Emphysema (ICD10-J43.9).  DG chest FINDINGS: Single frontal  view of the chest demonstrates stable left ventricular assist device and multi lead pacer/AICD. Cardiac silhouette remains enlarged. No acute airspace disease, effusion, or pneumothorax. No acute bony abnormalities.     COGNITION: Overall cognitive status: delayed response to history questions    SENSATION: WFL  COORDINATION:   Decreased speed of movement on the RLE>   POSTURE: forward head  LOWER EXTREMITY ROM:     AROM. Grossly WFL.   LOWER EXTREMITY MMT:    MMT Right Eval Left Eval  Hip flexion 4+ 4+  Hip extension    Hip abduction 5 5  Hip adduction 5 5  Hip internal rotation    Hip external rotation    Knee flexion 5 5  Knee extension 5 5  Ankle dorsiflexion 4+ 4+  Ankle plantarflexion    Ankle inversion    Ankle eversion    (Blank rows = not tested)  BED MOBILITY:  Not tested  TRANSFERS: Sit to stand: Complete Independence  Assistive device utilized: None     Stand to sit: Complete Independence  Assistive device utilized: None     Chair to chair: SBA  Assistive device utilized: None       RAMP:  Not tested  CURB:  Findings: CGA 1 UE support   STAIRS: Findings: Level of Assistance: SBA, Stair Negotiation Technique: Step to Pattern with Single Rail on Right Bilateral Rails, Number of Stairs: 8, Height of Stairs: 6   , and Comments: step through ascent step to descent  GAIT: Findings: Gait Characteristics: abducted- Right, Distance walked: 60, Assistive device utilized:None, Level of assistance: SBA and CGA, and Comments: mild adduction on the RLE.   FUNCTIONAL TESTS:  5 times sit to stand: 17.14 Timed up and go (TUG): 13.82 sec no AD 6 minute walk test: to be completed  10 meter walk test: 1.44m/s Berg Balance Scale: To be completed.  Dynamic Gait Index: 18 FGA: 21  PATIENT SURVEYS:  Stroke Impact Scale 71%  TREATMENT  DATE: 03/15/2024  Unless otherwise stated, CGA was provided and gait belt donned in order to ensure pt safety throughout session.  Neuro Re-ed:  Toe tapping on 3 hedgehogs- 3 different colors in a line: pt rated all activities as hard difficulty  - Round 1: R foot taps center & right hedgehog, L foot taps left hedgehog x1 minute - Round 2: R foot taps center & left hedgehog, L foot taps right hedgehog x1 minute - Round 3: R foot taps center & left hedgehog, L foot taps center & right hedgehog x1 minute  Weighted gait with 4# AW with cognitive demand to name car brands 157ft laps in clinic/hallway x5, switched to naming animals halfway through to prevent perseverating on thinking of car brands   Standing balance on Airex with ball toss to PT x1 minute  Standing balance on Airex with ball toss to PT and verbal recall task to name animals for first round x1 minute and foods for second round x2 minutes  Ther-Ex:  Step-ups on 6 step with 4# AW 2x10 for reciprocal step pattern and spatial awareness- pt rates difficulty as medium  Lateral step-ups on 6 step with 4#  AW 2x10 for spatial awareness and foot placement- instances of R foot catching   STS on Airex x10 from standard height chair- pt initially pushing off into standing from knees, pt cued to cross arms over chest to increase difficulty  Seated: - Lateral step over orange hurdle x10 BLE- SPT holding hurdle in place to prevent tipping over when R foot catches on hurdle - Adduction squeezes with 3 second hold x15 - Adduction squeezes with lateral toe taps x10 BLE  PATIENT EDUCATION: Education details: POC. Pt educated throughout session about proper posture and technique with exercises. Improved exercise technique, movement at target joints, use of target muscles after min to mod verbal, visual, tactile cues.  Person educated: Patient Education method: Explanation Education comprehension: verbalized understanding  HOME EXERCISE  PROGRAM:  Access Code: [email or web address] URL: [email or web address] Date: 03/02/2024 Prepared by: Connell Kiss  Exercises - Tandem Stance with Support  - 1 x daily - 4 x weekly - 3 sets - 4 reps - 20 seconds  hold - Forward and Backward Walking with Eyes Closed and Counter Support  - 1 x daily - 4 x weekly - 3 sets - 5 reps - Side Stepping with Resistance at Thighs and Counter Support  - 1 x daily - 7 x weekly - 2 sets - 10 reps - Forward Step Up with Unilateral Counter Support  - 1 x daily - 7 x weekly - 1-2 sets - 10 reps - Walking  - 3 x daily - 7 x weekly - 1 sets - 6 minutes hold  GOALS: Goals reviewed with patient? Yes  SHORT TERM GOALS: Target date: 03/29/2024    Patient will be independent in home exercise program to improve strength/mobility for better functional independence with ADLs. Baseline: provided on 6/4 Goal status: INITIAL   LONG TERM GOALS: Target date: 05/17/2024    Patient will increase SIS 16 score to equal to or greater than  10 points.    to demonstrate statistically significant improvement in mobility and quality of life.  Baseline: 71% Goal status: INITIAL  2.  Patient (> 7 years old) will complete five times sit to stand test in < 15 seconds indicating an increased LE strength and improved balance. Baseline: 17.14sec Goal status: INITIAL  3.  Patient will increase Berg Balance score  by > 6 points to demonstrate decreased fall risk during functional activities Baseline: to be completed  Goal status: INITIAL   4.  Patient will reduce timed up and go to <11 seconds to reduce fall risk and demonstrate improved transfer/gait ability. Baseline: 13.82 seconds  Goal status: INITIAL  5.  Patient will increase FGA  score to >/24 as to demonstrate reduced fall risk and improved dynamic gait balance for better safety with community/home ambulation.   Baseline: 21 Goal status: INITIAL  6.  Patient will increase 6 min walk test >110ft to  demonstrate decreased fall risk during functional activities as well as improved access to community mobility.  Baseline: to be completed ; 02/29/24: 1165' Goal status: INITIAL   ASSESSMENT:  CLINICAL IMPRESSION:   Pt responded well to progressions in LE strengthening and dual-tasking activities. Pt with instances of R foot dragging during stepping/walking, more notable during dual tasking requiring increased cognitive load. Pt with notable decrease in speed of walking and activity when challenged with cognitive dual-task, but pt required less re-direction to verbal tasks today. Pt was motivated to participate throughout session and was receptive to education on increasing water intake throughout the day. Pt will benefit from continued skilled PT to allow return to PLOF and increased overall QoL.    OBJECTIVE IMPAIRMENTS: Abnormal gait, cardiopulmonary status limiting activity, decreased activity tolerance, decreased balance, decreased coordination, decreased endurance, decreased mobility, difficulty walking, decreased strength, and improper body mechanics.   ACTIVITY LIMITATIONS: carrying, lifting, squatting, stairs, transfers, locomotion level, and caring for others  PARTICIPATION LIMITATIONS: cleaning, laundry, driving, shopping, community activity, occupation, and yard work  PERSONAL FACTORS: Age and 3+ comorbidities: LVAD, Pacemaker, hypotension  are also affecting patient's functional outcome.   REHAB POTENTIAL: Excellent  CLINICAL DECISION MAKING: Stable/uncomplicated  EVALUATION COMPLEXITY: Low  PLAN:  PT FREQUENCY: 1-2x/week  PT DURATION: 12 weeks  PLANNED INTERVENTIONS: [id number]- PT Re-evaluation, 97750- Physical Performance Testing, 97110-Therapeutic exercises, 97530- Therapeutic activity, 97112- Neuromuscular re-education, 97535- Self Care, [id number]- Manual therapy, [phone]- Gait training, Patient/Family education, Balance training, Stair training, Taping, DME instructions,  Cryotherapy, and Moist heat  PLAN FOR NEXT SESSION:   - higher level dynamic balance challenges - R LE strengthening - higher level dynamic gait challenges   - include unstable surfaces, stepping over obstacles, dual-tasks for cognitive/speech challenges - balance with reduced BOS  Continue progressing  Tabitha Tupper, SPT  This entire session was performed under direct supervision and direction of a licensed therapist/therapist assistant . I have personally read, edited and approve of the note as written.  Marina  Leopoldo PT, DPT Physical Therapist - [facility]  Outpatient Physical Therapy- Main Campus [phone]    5:11 PM 03/15/24

## 2024-03-16 ENCOUNTER — Ambulatory Visit (HOSPITAL_COMMUNITY): Payer: Self-pay

## 2024-03-16 LAB — POCT INR: INR: 1.9 — AB (ref 2.0–3.0)

## 2024-03-16 NOTE — Progress Notes (Signed)
 LVAD INR

## 2024-03-21 ENCOUNTER — Encounter

## 2024-03-21 ENCOUNTER — Ambulatory Visit (HOSPITAL_COMMUNITY)
Admission: RE | Admit: 2024-03-21 | Discharge: 2024-03-21 | Disposition: A | Discharge status: Home / Self Care | Source: Ambulatory Visit | Attending: Cardiology | Admitting: Cardiology

## 2024-03-21 ENCOUNTER — Inpatient Hospital Stay (HOSPITAL_COMMUNITY)
Admission: EM | Admit: 2024-03-21 | Discharge: 2024-04-02 | DRG: 378 | Disposition: A | Discharge status: Home / Self Care | Attending: Cardiology | Admitting: Cardiology

## 2024-03-21 ENCOUNTER — Ambulatory Visit: Admitting: Speech Pathology

## 2024-03-21 ENCOUNTER — Other Ambulatory Visit (HOSPITAL_COMMUNITY): Payer: Self-pay | Admitting: Unknown Physician Specialty

## 2024-03-21 ENCOUNTER — Encounter (HOSPITAL_COMMUNITY): Payer: Self-pay

## 2024-03-21 ENCOUNTER — Inpatient Hospital Stay (HOSPITAL_COMMUNITY)

## 2024-03-21 ENCOUNTER — Ambulatory Visit

## 2024-03-21 VITALS — BP 98/0 | HR 83 | Wt 178.0 lb

## 2024-03-21 DIAGNOSIS — I110 Hypertensive heart disease with heart failure: Secondary | ICD-10-CM | POA: Diagnosis not present

## 2024-03-21 DIAGNOSIS — Z87891 Personal history of nicotine dependence: Secondary | ICD-10-CM | POA: Diagnosis not present

## 2024-03-21 DIAGNOSIS — Z955 Presence of coronary angioplasty implant and graft: Secondary | ICD-10-CM | POA: Insufficient documentation

## 2024-03-21 DIAGNOSIS — R946 Abnormal results of thyroid function studies: Secondary | ICD-10-CM | POA: Insufficient documentation

## 2024-03-21 DIAGNOSIS — I5022 Chronic systolic (congestive) heart failure: Secondary | ICD-10-CM

## 2024-03-21 DIAGNOSIS — I071 Rheumatic tricuspid insufficiency: Secondary | ICD-10-CM | POA: Insufficient documentation

## 2024-03-21 DIAGNOSIS — Z95811 Presence of heart assist device: Secondary | ICD-10-CM | POA: Diagnosis not present

## 2024-03-21 DIAGNOSIS — Z8673 Personal history of transient ischemic attack (TIA), and cerebral infarction without residual deficits: Secondary | ICD-10-CM

## 2024-03-21 DIAGNOSIS — R1013 Epigastric pain: Secondary | ICD-10-CM | POA: Insufficient documentation

## 2024-03-21 DIAGNOSIS — K921 Melena: Secondary | ICD-10-CM

## 2024-03-21 DIAGNOSIS — K3189 Other diseases of stomach and duodenum: Secondary | ICD-10-CM | POA: Diagnosis present

## 2024-03-21 DIAGNOSIS — N184 Chronic kidney disease, stage 4 (severe): Secondary | ICD-10-CM | POA: Insufficient documentation

## 2024-03-21 DIAGNOSIS — K922 Gastrointestinal hemorrhage, unspecified: Secondary | ICD-10-CM

## 2024-03-21 DIAGNOSIS — I130 Hypertensive heart and chronic kidney disease with heart failure and stage 1 through stage 4 chronic kidney disease, or unspecified chronic kidney disease: Secondary | ICD-10-CM | POA: Diagnosis present

## 2024-03-21 DIAGNOSIS — K5520 Angiodysplasia of colon without hemorrhage: Secondary | ICD-10-CM | POA: Diagnosis not present

## 2024-03-21 DIAGNOSIS — I10 Essential (primary) hypertension: Secondary | ICD-10-CM | POA: Diagnosis present

## 2024-03-21 DIAGNOSIS — E782 Mixed hyperlipidemia: Secondary | ICD-10-CM | POA: Diagnosis present

## 2024-03-21 DIAGNOSIS — I252 Old myocardial infarction: Secondary | ICD-10-CM

## 2024-03-21 DIAGNOSIS — K250 Acute gastric ulcer with hemorrhage: Secondary | ICD-10-CM | POA: Diagnosis present

## 2024-03-21 DIAGNOSIS — I2510 Atherosclerotic heart disease of native coronary artery without angina pectoris: Secondary | ICD-10-CM | POA: Diagnosis present

## 2024-03-21 DIAGNOSIS — K648 Other hemorrhoids: Secondary | ICD-10-CM | POA: Diagnosis present

## 2024-03-21 DIAGNOSIS — E785 Hyperlipidemia, unspecified: Secondary | ICD-10-CM | POA: Diagnosis present

## 2024-03-21 DIAGNOSIS — Z79899 Other long term (current) drug therapy: Secondary | ICD-10-CM | POA: Insufficient documentation

## 2024-03-21 DIAGNOSIS — K5521 Angiodysplasia of colon with hemorrhage: Principal | ICD-10-CM | POA: Diagnosis present

## 2024-03-21 DIAGNOSIS — M7041 Prepatellar bursitis, right knee: Secondary | ICD-10-CM | POA: Diagnosis present

## 2024-03-21 DIAGNOSIS — Z95810 Presence of automatic (implantable) cardiac defibrillator: Secondary | ICD-10-CM | POA: Insufficient documentation

## 2024-03-21 DIAGNOSIS — Z7901 Long term (current) use of anticoagulants: Secondary | ICD-10-CM | POA: Diagnosis not present

## 2024-03-21 DIAGNOSIS — K31819 Angiodysplasia of stomach and duodenum without bleeding: Secondary | ICD-10-CM | POA: Diagnosis present

## 2024-03-21 DIAGNOSIS — R110 Nausea: Secondary | ICD-10-CM | POA: Diagnosis not present

## 2024-03-21 DIAGNOSIS — I4819 Other persistent atrial fibrillation: Secondary | ICD-10-CM | POA: Diagnosis present

## 2024-03-21 DIAGNOSIS — K259 Gastric ulcer, unspecified as acute or chronic, without hemorrhage or perforation: Secondary | ICD-10-CM | POA: Diagnosis not present

## 2024-03-21 DIAGNOSIS — I429 Cardiomyopathy, unspecified: Secondary | ICD-10-CM

## 2024-03-21 DIAGNOSIS — Z9861 Coronary angioplasty status: Secondary | ICD-10-CM

## 2024-03-21 DIAGNOSIS — D62 Acute posthemorrhagic anemia: Secondary | ICD-10-CM | POA: Diagnosis not present

## 2024-03-21 DIAGNOSIS — I4891 Unspecified atrial fibrillation: Secondary | ICD-10-CM | POA: Diagnosis present

## 2024-03-21 DIAGNOSIS — Z8249 Family history of ischemic heart disease and other diseases of the circulatory system: Secondary | ICD-10-CM

## 2024-03-21 DIAGNOSIS — I480 Paroxysmal atrial fibrillation: Secondary | ICD-10-CM | POA: Insufficient documentation

## 2024-03-21 DIAGNOSIS — K2980 Duodenitis without bleeding: Secondary | ICD-10-CM | POA: Diagnosis present

## 2024-03-21 DIAGNOSIS — K5730 Diverticulosis of large intestine without perforation or abscess without bleeding: Secondary | ICD-10-CM | POA: Diagnosis present

## 2024-03-21 DIAGNOSIS — I5043 Acute on chronic combined systolic (congestive) and diastolic (congestive) heart failure: Secondary | ICD-10-CM | POA: Diagnosis not present

## 2024-03-21 LAB — BASIC METABOLIC PANEL WITH GFR
Anion gap: 11 (ref 5–15)
BUN: 63 mg/dL — ABNORMAL HIGH (ref 8–23)
CO2: 20 mmol/L — ABNORMAL LOW (ref 22–32)
Calcium: 9.3 mg/dL (ref 8.9–10.3)
Chloride: 102 mmol/L (ref 98–111)
Creatinine, Ser: 2.33 mg/dL — ABNORMAL HIGH (ref 0.61–1.24)
GFR, Estimated: 29 mL/min — ABNORMAL LOW (ref >60)
Glucose, Bld: 99 mg/dL (ref 70–99)
Potassium: 3.8 mmol/L (ref 3.5–5.1)
Sodium: 133 mmol/L — ABNORMAL LOW (ref 135–145)

## 2024-03-21 LAB — HEPATIC FUNCTION PANEL
ALT: 16 U/L (ref 0–44)
AST: 23 U/L (ref 15–41)
Albumin: 3.5 g/dL (ref 3.5–5.0)
Alkaline Phosphatase: 62 U/L (ref 38–126)
Bilirubin, Direct: 0.2 mg/dL (ref 0.0–0.2)
Indirect Bilirubin: 0.6 mg/dL (ref 0.3–0.9)
Total Bilirubin: 0.8 mg/dL (ref 0.0–1.2)
Total Protein: 6.6 g/dL (ref 6.5–8.1)

## 2024-03-21 LAB — MRSA NEXT GEN BY PCR, NASAL: MRSA by PCR Next Gen: NOT DETECTED

## 2024-03-21 LAB — PREPARE RBC (CROSSMATCH)

## 2024-03-21 LAB — PROTIME-INR
INR: 2.2 — ABNORMAL HIGH (ref 0.8–1.2)
Prothrombin Time: 25.9 s — ABNORMAL HIGH (ref 11.4–15.2)

## 2024-03-21 LAB — LACTATE DEHYDROGENASE: LDH: 124 U/L (ref 98–192)

## 2024-03-21 MED ORDER — SODIUM CHLORIDE 0.9% IV SOLUTION: Freq: Once | INTRAVENOUS | Status: AC

## 2024-03-21 MED ORDER — SODIUM CHLORIDE 0.9 % IV BOLUS (SEPSIS)
500.0000 mL | Freq: Once | INTRAVENOUS | Status: AC
  Administered 2024-03-21: 500 mL via INTRAVENOUS

## 2024-03-21 MED ORDER — ACETAMINOPHEN 325 MG PO TABS
650.0000 mg | ORAL_TABLET | ORAL | Status: DC | PRN
  Administered 2024-03-27: 650 mg via ORAL
  Filled 2024-03-21: qty 2

## 2024-03-21 MED ORDER — PANTOPRAZOLE SODIUM 40 MG PO TBEC
40.0000 mg | DELAYED_RELEASE_TABLET | Freq: Two times a day (BID) | ORAL | Status: DC
  Administered 2024-03-21: 40 mg via ORAL
  Filled 2024-03-21: qty 1

## 2024-03-21 MED ORDER — CHLORHEXIDINE GLUCONATE CLOTH 2 % EX PADS
6.0000 | MEDICATED_PAD | Freq: Every day | CUTANEOUS | Status: DC
Start: 2024-03-21 — End: 2024-03-29
  Administered 2024-03-21 – 2024-03-29 (×9): 6 via TOPICAL

## 2024-03-21 MED ORDER — ONDANSETRON HCL 4 MG/2ML IJ SOLN
4.0000 mg | Freq: Once | INTRAMUSCULAR | Status: AC
  Administered 2024-03-21: 4 mg via INTRAVENOUS

## 2024-03-21 MED ORDER — ONDANSETRON HCL 4 MG/2ML IJ SOLN
4.0000 mg | Freq: Four times a day (QID) | INTRAMUSCULAR | Status: DC | PRN
  Administered 2024-03-24 – 2024-03-26 (×3): 4 mg via INTRAVENOUS
  Filled 2024-03-21 (×3): qty 2

## 2024-03-21 NOTE — Progress Notes (Signed)
 Cardiology: Dr. Rolan PCP: Valora Agent, MD  Chief complaint: CHF  Follow up for Heart Failure/LVAD: 73 y.o. with history of CAD, HTN, atrial fibrillation, and chronic systolic CHF returns for followup of CHF/LVAD.   Patient has had a long-standing cardiomyopathy, EF had been in the 25% range for years.  He had PCI to OM1 in 2007 and RCA in 2013.  From his report, these episodes do not sound like ACS events.  Echo in 12/22 showed EF < 20%, severe LV dilation, restrictive diastolic function, moderate RV dysfunction, moderate MR, mod-severe TR. HF was been complicated by CKD stage 3. He was also noted on device interrogation to have been in atrial fibrillation persistently since 10/22.  He was admitted at [facility] in 12/22.  BP was low, his BP-active meds were stopped and midodrine  was begun.  He was diuresed and discharged home.    He reports ongoing severe dyspnea and was admitted again in 12/22 after he stood up then passed out at home.  Device interrogation showed run of VT treated x 2 with ATP possibly around the time of syncope.  RHC in 12/22 showed low output HF and he developed cardiogenic shock. Impella 5.5 was placed.  On 09/17/21, HM3 LVAD was placed, ICD leads were plastered to the tricuspid valve with severe TR, the valve was not replaced.  The patient had significant interoperative hypotension.  Post-operatively, he developed progressive renal failure and was started on CVVH then iHD.  He had a bumpy post-op course with renal failure and RV failure, but was ultimately able to discharge home on 10/24/21.   Ramp echo in 3/23, RV mildly dilated with normal systolic function, severe TR, LV EF 20-25%, aortic valve opens every beat.  Speed increased to 5300 rpm. The IV septum was mildly leftwards at 5300 rpm but flow increased and AoV still opened every beat.   Patient was admitted in 3/24 with left groin abscess.  This spontaneously drained and was treated with IV antibiotics.  He was sent home on  linezolid  and Augmentin .  He developed profuse diarrhea that he attributed to linezolid .  Linezolid  was stopped and he was continued on Augmentin .   Echo in 7/24 showed EF < 20%, IV septum midline to slightly leftward, RV moderately dilated with moderately decreased systolic function, IVC dilated with severe TR.  Speed not changed.   He was admitted with dysarthria and right-sided weakness in 5/25.  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 Echo this admission showed EF 20-25%, mild LVH, LVAD present, mild RV dysfunction, moderate AI, mild RV dysfunction with normal RV size, severe TR.  INR goal increased to 2.5-3. Dual chamber ICD generator was also changed this admission, needs pacing at times with bradycardia.   Patient is seen for a sick visit today.  He took Lovenox  from 6/20-6/24 for low INR.  He reports black, tarry stool since that time (though he did not tell his wife). Since Sunday, he has had lightheadedness and has felt tired.  MAP ranging from 69-80 in the office today. He has had some nausea, no vomiting/hematemesis.  He has had epigastric discomfort for about a day but has continued to eat/drink.  No focal weakness/numbness.  Not short of breath.  LVAD parameters are stable.   LVAD interrogation (personally reviewed): No alarms.  Flow 4.2 L/min Speed 5300 rpm Power 3.8 PI 3.7  Labs (3/24): hgb 10.7, LDH 157, LFTs normal, creatinine 2.75 Labs (5/24): K 3.6, creatinine 2.7, LFTs  normal, TSH normal Labs (7/24): hgb 11.7, LDH 172, K 3.5, creatinine 2.33 Labs (10/24): K 3.4, creatinine 2.24 Labs (12/24): K 4, creatinine 2.37 Labs (2/25): TSH normal Labs (3/25): hgb 12.5, K 4.9, creatinine 2.57 Labs (4/25): creatinine 2.8 Labs (5/25): K 4, creatinine 1.97, LDH 163, hgb 12.3 Labs (6/25): K 4.3, creatinine 2.24, LDH 175, hgb 11.9, plts 220  PMH: 1. HTN 2. Hyperlipidemia 3. CAD: H/o PCI to OM1 2007 and PCI to RCA in 2013.  Uncertain of circumstances but do  not sound like ACS events.  4. CKD stage IV: Patient was on HD for several months post-LVAD but was able to come off.  5. Atrial fibrillation: Paroxysmal 6. Chronic systolic CHF: Long-standing cardiomyopathy with EF in the 25% range.  Cause uncertain, has history of PCI to RCA and OM1, but degree of CAD does not seem to explain severity of cardiomyopathy. Father had history of CAD/CHF.  Not heavy drinker. Has [employer] dual chamber ICD.  - Echo (12/22): EF < 20%, severe LV dilation, restrictive diastolic function, moderate RV dysfunction, moderate MR, mod-severe TR.  - 12/22 cardiogenic shock with Impella 5.5 placement then Heartmate 3 LVAD.  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  - Echo (7/24): EF < 20%, IV septum midline to slightly leftward, RV moderately dilated with moderately decreased systolic function, IVC dilated with severe TR. - Echo (5/25): EF 20-25%, mild LVH, LVAD present, mild RV dysfunction, moderate AI, mild RV dysfunction with normal RV size, severe TR.  7. VT - Nausea with amiodarone .  8. RV failure/severe TR 9. Citrobacter UTI (8/23) 10. Left groin abscess 3/24 11. 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    Current Outpatient Medications  Medication Sig Dispense Refill   furosemide  (LASIX ) 20 MG tablet Take 2 tablets (40 mg total) by mouth daily. 60 tablet 0   magnesium  oxide (MAG-OX) 400 (240 Mg) MG tablet Take 1 tablet (400 mg total) by mouth daily. 30 tablet 0   pantoprazole  (PROTONIX ) 40 MG tablet Take 1 tablet (40 mg total) by mouth daily. 30 tablet 0   potassium chloride  SA (KLOR-CON  M) 10 MEQ tablet Take 2 tablets (20 mEq total) by mouth daily. 180 tablet 3   rosuvastatin  (CRESTOR ) 10 MG tablet Take 1 tablet (10 mg total) by mouth at bedtime. 30 tablet 0   sildenafil  (REVATIO ) 20 MG tablet Take 1 tablet (20 mg total) by mouth 3 (three) times daily. 90 tablet 0   warfarin (COUMADIN ) 2 MG tablet  Take 2 mg by mouth every Tuesday, Thursday, and Saturday and 4 mg all other days 40 tablet 11   acetaminophen  (TYLENOL ) 325 MG tablet Take 2 tablets (650 mg total) by mouth every 4 (four) hours as needed for headache or mild pain (pain score 1-3). (Patient not taking: Reported on 03/21/2024)     enoxaparin  (LOVENOX ) 40 MG/0.4ML injection Inject 0.4 mLs (40 mg total) into the skin daily. 4 mL 1   polyethylene glycol (MIRALAX  / GLYCOLAX ) 17 g packet Take 17 g by mouth daily as needed for moderate constipation. (Patient not taking: Reported on 03/21/2024)     No current facility-administered medications for this encounter.    Mushroom extract complex (obsolete), Doxycycline , Neosporin [neomycin-bacitracin zn-polymyx], and Tape  REVIEW OF SYSTEMS: All systems negative except as listed in HPI, PMH and Problem list.  I reviewed the LVAD parameters from today, and compared the results to the patient's prior recorded data.  No programming changes were made.  The LVAD is functioning within specified parameters.  The patient performs LVAD self-test daily.  LVAD interrogation was negative for any significant power changes, alarms or PI events/speed drops.  LVAD equipment check completed and is in good working order.  Back-up equipment present.   LVAD education done on emergency procedures and precautions and reviewed exit site care.    Vitals:   03/21/24 1433 03/21/24 1434  BP: 97/78 (!) 98/0  Pulse: 83   SpO2: 99%   Weight: 80.7 kg (178 lb)   MAP 69-80  Physical Exam: General: Lethargic, pale.   HEENT: Normal. Neck: Supple, JVP 8 cm. Carotids OK.  Cardiac:  Mechanical heart sounds with LVAD hum present.  Lungs:  CTAB, normal effort.  Abdomen:  Mild epigastric tenderness, ND, no HSM. No bruits or masses. +BS  LVAD exit site: Well-healed and incorporated. Dressing dry and intact. No erythema or drainage. Stabilization device present and accurately applied. Driveline dressing changed daily per sterile  technique. Extremities:  Warm and dry. No cyanosis, clubbing, rash, or edema.  Neuro:  Lethargic but follows commands and carries on conversation.   ASSESSMENT AND PLAN: 1. GI: Patient has epigastric pain, nausea, and melena x 1 week after getting Lovenox  for several days.  INR goal has been increased to 2.5-3 since his CVA.  I worry that this presentation is due to upper GI bleeding. He has been lightheaded at times though MAP not significantly low today.  - Stat CBC, type and screen.  Transfuse hgb < 8.  - If hgb is low as I expect, will need GI consult.  - Check INR, hold warfarin for now.  - Will give LR 500 cc total in clinic, hold Lasix  and KCl.  - With abdominal pain, will get CT chest/abdomen/pelvis (no IV contrast with CKD).  2. Chronic systolic CHF:  Long-standing cardiomyopathy.  [employer] dual chamber ICD.  Echo in 12/22 with EF < 20%, severe LV dilation, restrictive diastolic function, moderate RV dysfunction, moderate MR, mod-severe TR. Cause of cardiomyopathy is uncertain.  He has a history of CAD, but I do not think that the described CAD from the past could explain his cardiomyopathy, but CAD could have progressed.  He was admitted in 12/22 with cardiogenic shock and Impella 5.5. s/p HM III VAD on 09/17/22.  Post-op course complicated by AKI and RV failure. He was on HD for several months but is now off.  Echo in 7/24 showed EF < 20%, IV septum midline to slightly leftward, RV moderately dilated with moderately decreased systolic function, IVC dilated with severe TR.  Echo in 5/25 with EF 20-25%, mild LVH, LVAD present, mild RV dysfunction, moderate AI, mild RV dysfunction with normal RV size, severe TR.  NYHA class II, not significantly volume overloaded on exam.  Last creatinine was 2.24. LVAD parameters reviewed and stable today, no alarms.  - Hold Lasix  and KCl with concern for GI bleeding.  - Hold sildenafil  with lightheadedness and lower MAP.  - Check INR and hold  warfarin for now with concern for GI bleeding.  3. Tricuspid regurgitation:  Tricuspid repair not done at time of VAD due to proximity of ICD wires and hypotension during surgery. He has severe TR.  4. Atrial fibrillation: Paroxysmal.  He is off amiodarone  with resolution of nausea.  I will not restart it.  He was a-paced last office visit but was in AF last hospitalization.  - ECG today.  5. CKD stage IV:  Suspect intra-op hypotension at the time of LVAD placement led to development of ATN => urine sediment looked like ATN per renal. Had initial CVVH and transitioned over to HD. He is now off HD. Creatinine was 2.2 most recently. I worry he could have AKI with possible GI bleeding.  - BMET now.  - Hold Lasix , gentle IVF as above.  6. CAD: History of PCI to OM1 in 2007 and RCA in 2013.  No CP or ACs.  - Deferred cardiac cath in 12/22 due to AKI and plan for VAD - Continue Crestor .   7. Abnormal thyroid  function tests: Patient has had elevated TSH and free T4, mixed pattern. We have stopped amiodarone . 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  Most recent TSH was normal.  - He follows with endocrinology.   - Check TSH this admission.  8. Nausea: This seems to have been due to amiodarone , resolved off amiodarone .  Now has recurred, possibly due to upper GI bleeding.  9. 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  INR goal increased to 2.5-3.  - Check INR today, will hold warfarin for now pending workup for suspected GI bleeding.    He will need admission today, will put him on 2H.    Ezra Shuck 03/21/2024

## 2024-03-21 NOTE — H&P (Addendum)
 Advanced Heart Failure Team History and Physical Note   PCP:  Valora Agent, MD  PCP-Cardiology: Ezra Shuck, MD    Reason for Admission: GIB HPI:    73 y.o. with history of CAD, HTN, atrial fibrillation, and chronic systolic CHF s/p LVAD HM3.   Patient has had a long-standing cardiomyopathy, EF had been in the 25% range for years.  He had PCI to OM1 in 2007 and RCA in 2013.  Echo in 12/22 showed EF < 20%, severe LV dilation, restrictive diastolic function, moderate RV dysfunction, moderate MR, mod-severe TR. HF c/b  CKD stage 3, persistent atrial fibrillation, and hypotension requiring midodrine .    Presented 12/22 with syncope and DOE. Device interrogation showed run of VT treated x 2 with ATP possibly around the time of syncope. RHC showed low output HF and he developed cardiogenic shock. Impella 5.5 was placed and underwent HM3 LVAD placement, ICD leads were plastered to the tricuspid valve with severe TR, the valve was not replaced.  The patient had significant interoperative hypotension.  Post-operatively, he developed progressive renal failure and was started on CVVH then iHD.  He had a bumpy post-op course with renal failure and RV failure, but was ultimately able to discharge home on 10/24/21.    Ramp echo in 3/23, RV mildly dilated with normal systolic function, severe TR, LV EF 20-25%, aortic valve opens every beat.  Speed increased to 5300 rpm. The IV septum was mildly leftwards at 5300 rpm but flow increased and AoV still opened every beat.    Patient was admitted in 3/24 with left groin abscess.  This spontaneously drained and was treated with IV antibiotics.  He was sent home on linezolid  and Augmentin .  He developed profuse diarrhea that he attributed to linezolid .  Linezolid  was stopped and he was continued on Augmentin .    Echo in 7/24 showed EF < 20%, IV septum midline to slightly leftward, RV moderately dilated with moderately decreased systolic function, IVC dilated with  severe TR.  Speed not changed.    He was admitted with dysarthria and right-sided weakness in 5/25.  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 Echo this admission showed EF 20-25%, mild LVH, LVAD present, mild RV dysfunction, moderate AI, mild RV dysfunction with normal RV size, severe TR.  INR goal increased to 2.5-3. Dual chamber ICD generator was also changed this admission, needs pacing at times with bradycardia.    Patient is seen for a sick visit today.  He took Lovenox  from 6/20-6/24 for low INR.  He reports black, tarry stool since that time (though he did not tell his wife). Since Sunday, he has had lightheadedness and has felt tired.  MAP ranging from 69-80 in the office today. He has had some nausea, no vomiting/hematemesis.  He has had epigastric discomfort for about a day but has continued to eat/drink.  No focal weakness/numbness.  Not short of breath.  LVAD parameters are stable.   Home Medications Prior to Admission medications   Medication Sig Start Date End Date Taking? Authorizing Provider  acetaminophen  (TYLENOL ) 325 MG tablet Take 2 tablets (650 mg total) by mouth every 4 (four) hours as needed for headache or mild pain (pain score 1-3). Patient not taking: Reported on 03/21/2024 02/18/24   Angiulli, Toribio PARAS, PA-C  enoxaparin  (LOVENOX ) 40 MG/0.4ML injection Inject 0.4 mLs (40 mg total) into the skin daily. 03/10/24   Shuck Ezra RAMAN, MD  furosemide  (LASIX ) 20 MG tablet Take  2 tablets (40 mg total) by mouth daily. 02/18/24   Angiulli, Toribio PARAS, PA-C  magnesium  oxide (MAG-OX) 400 (240 Mg) MG tablet Take 1 tablet (400 mg total) by mouth daily. 02/18/24   Angiulli, Toribio PARAS, PA-C  pantoprazole  (PROTONIX ) 40 MG tablet Take 1 tablet (40 mg total) by mouth daily. 02/18/24   Angiulli, Toribio PARAS, PA-C  polyethylene glycol (MIRALAX  / GLYCOLAX ) 17 g packet Take 17 g by mouth daily as needed for moderate constipation. Patient not taking: Reported on 03/21/2024 02/18/24    Pegge Toribio PARAS, PA-C  potassium chloride  SA (KLOR-CON  M) 10 MEQ tablet Take 2 tablets (20 mEq total) by mouth daily. 02/28/24   Rolan Ezra RAMAN, MD  rosuvastatin  (CRESTOR ) 10 MG tablet Take 1 tablet (10 mg total) by mouth at bedtime. 02/18/24   Angiulli, Toribio PARAS, PA-C  sildenafil  (REVATIO ) 20 MG tablet Take 1 tablet (20 mg total) by mouth 3 (three) times daily. 02/18/24   Angiulli, Toribio PARAS, PA-C  warfarin (COUMADIN ) 2 MG tablet Take 2 mg by mouth every Tuesday, Thursday, and Saturday and 4 mg all other days 03/10/24   Rolan Ezra RAMAN, MD    Past Medical History: Past Medical History:  Diagnosis Date   Arrhythmia    atrial fibrillation   CHF (congestive heart failure) (HCC)    Chronic kidney disease    Coronary artery disease    Hyperlipidemia    Hypertension    Myocardial infarct [facility])     Past Surgical History: Past Surgical History:  Procedure Laterality Date   CARDIAC DEFIBRILLATOR PLACEMENT  feb 2014   ICD GENERATOR CHANGEOUT N/A 02/09/2024   Procedure: ICD GENERATOR CHANGEOUT;  Surgeon: Waddell Danelle ORN, MD;  Location: [facility] INVASIVE CV LAB;  Service: Cardiovascular;  Laterality: N/A;   INSERTION OF IMPLANTABLE LEFT VENTRICULAR ASSIST DEVICE N/A 09/17/2021   Procedure: INSERTION OF IMPLANTABLE LEFT VENTRICULAR ASSIST DEVICE AND INSERTION OF FEMORAL ARTERIAL LINE;  Surgeon: Lucas Dorise POUR, MD;  Location: [facility] OR;  Service: Open Heart Surgery;  Laterality: N/A;   IR FLUORO GUIDE CV LINE RIGHT  09/27/2021   IR THORACENTESIS ASP PLEURAL SPACE W/IMG GUIDE  09/27/2021   IR US  GUIDE VASC ACCESS RIGHT  09/27/2021   PLACEMENT OF IMPELLA LEFT VENTRICULAR ASSIST DEVICE N/A 09/12/2021   Procedure: PLACEMENT OF IMPELLA 5.5 LEFT VENTRICULAR ASSIST DEVICE;  Surgeon: Kerrin Elspeth BROCKS, MD;  Location: [facility] OR;  Service: Open Heart Surgery;  Laterality: N/A;   RIGHT HEART CATH N/A 09/08/2021   Procedure: RIGHT HEART CATH;  Surgeon: Rolan Ezra RAMAN, MD;  Location: [facility] INVASIVE CV LAB;  Service:  Cardiovascular;  Laterality: N/A;   TEE WITHOUT CARDIOVERSION N/A 09/12/2021   Procedure: TRANSESOPHAGEAL ECHOCARDIOGRAM (TEE);  Surgeon: Kerrin Elspeth BROCKS, MD;  Location: [facility] OR;  Service: Open Heart Surgery;  Laterality: N/A;   TEE WITHOUT CARDIOVERSION N/A 09/17/2021   Procedure: TRANSESOPHAGEAL ECHOCARDIOGRAM (TEE);  Surgeon: Lucas Dorise POUR, MD;  Location: [facility] OR;  Service: Open Heart Surgery;  Laterality: N/A;    Family History:  Family History  Problem Relation Age of Onset   Hypertension Mother    Hypertension Father    Hyperlipidemia Father    Heart attack Father    Heart failure Father     Social History: Social History   Socioeconomic History   Marital status: Married    Spouse name: Not on file   Number of children: Not on file   Years of education: Not on file   Highest education level:  Not on file  Occupational History   Not on file  Tobacco Use   Smoking status: Former   Smokeless tobacco: Never  Vaping Use   Vaping status: Never Used  Substance and Sexual Activity   Alcohol use: No   Drug use: No   Sexual activity: Not on file  Other Topics Concern   Not on file  Social History Narrative   Not on file   Social Drivers of Health   Financial Resource Strain: Not on file  Food Insecurity: No Food Insecurity (02/10/2024)   Hunger Vital Sign    Worried About Running Out of Food in the Last Year: Never true    Ran Out of Food in the Last Year: Never true  Transportation Needs: No Transportation Needs (02/10/2024)   PRAPARE - Administrator, Civil Service (Medical): No    Lack of Transportation (Non-Medical): No  Physical Activity: Not on file  Stress: Not on file  Social Connections: Moderately Integrated (02/10/2024)   Social Connection and Isolation Panel    Frequency of Communication with Friends and Family: More than three times a week    Frequency of Social Gatherings with Friends and Family: More than three times a week    Attends  Religious Services: Never    Database administrator or Organizations: Yes    Attends Banker Meetings: 1 to 4 times per year    Marital Status: Married  Recent Concern: Social Connections - Moderately Isolated (02/05/2024)   Social Connection and Isolation Panel    Frequency of Communication with Friends and Family: Never    Frequency of Social Gatherings with Friends and Family: Never    Attends Religious Services: Never    Database administrator or Organizations: Yes    Attends Banker Meetings: 1 to 4 times per year    Marital Status: Married    Allergies:  Allergies  Allergen Reactions   Mushroom Extract Complex (Obsolete) Nausea And Vomiting   Doxycycline  Nausea And Vomiting   Neosporin [Neomycin-Bacitracin Zn-Polymyx] Hives   Tape Other (See Comments)    Some tapes/dressings can irritate the skin    Objective:    Vital Signs:   Temp:  [98.1 F (36.7 C)] 98.1 F (36.7 C) (07/01 1622)   There were no vitals filed for this visit.   Physical Exam     General: Pale appearing. No distress on RA Cardiac: JVP flat. Mechanical heart sounds with LVAD hum present.  Driveline: Dressing C/D/I. No drainage or redness. Anchor in place. Extremities: Warm and dry. No peripheral edema. Neuro: Alert and oriented x3. Affect pleasant. Mild aphasia.  LVAD Interrogation HM 3: Speed: 5300 Flow: 4.3 PI: 3.7 Power: 3.8. 10 PI events  Labs    Basic Metabolic Panel: Recent Labs  Lab 03/21/24 1500  NA 133*  K 3.8  CL 102  CO2 20*  GLUCOSE 99  BUN 63*  CREATININE 2.33*  CALCIUM  9.3   CBC: Recent Labs  Lab 03/21/24 1500  WBC 6.5  HGB 6.4*  HCT 20.3*  MCV 97.6  PLT 241   BNP (last 3 results) Recent Labs    02/05/24 1106  BNP 343.8*   Coagulation Studies: Recent Labs    03/21/24 1500  LABPROT 25.9*  INR 2.2*   Patient Profile   73 y.o. with history of CAD, HTN, atrial fibrillation, and chronic systolic CHF s/p LVAD HM3. Presenting  with GIB.   Assessment/Plan   1. GIB:  Patient has epigastric pain, nausea, and melena x 1 week after getting Lovenox  for several days.  INR goal has been increased to 2.5-3 since his CVA. Reports lightheadedness. Pale on exam. Hgb 6.4.  - Transfuse 2u RBCs. Trend H/H. - Start protonix  40 mg bid - GI consulted; INR 2.2, likely can scope tomorrow if needed. Holding warfarin - Given LR 500 cc total in clinic, hold Lasix  and KCl.  - With abdominal pain, will get CT chest/abdomen/pelvis (no IV contrast with CKD).  2. Chronic systolic CHF:  Long-standing cardiomyopathy.  [employer] dual chamber ICD.  Echo in 12/22 with EF < 20%, severe LV dilation, restrictive diastolic function, moderate RV dysfunction, moderate MR, mod-severe TR. Cause of cardiomyopathy is uncertain.  He has a history of CAD, but I do not think that the described CAD from the past could explain his cardiomyopathy, but CAD could have progressed.  He was admitted in 12/22 with cardiogenic shock and Impella 5.5. s/p HM III VAD on 09/17/22.  Post-op course complicated by AKI and RV failure. He was on HD for several months but is now off.  Echo in 7/24 showed EF < 20%, IV septum midline to slightly leftward, RV moderately dilated with moderately decreased systolic function, IVC dilated with severe TR.  Echo in 5/25 with EF 20-25%, mild LVH, LVAD present, mild RV dysfunction, moderate AI, mild RV dysfunction with normal RV size, severe TR.  NYHA class II, not significantly volume overloaded on exam.  Last creatinine was 2.24. LVAD parameters reviewed and stable today, no alarms.  - Hold Lasix  and KCl with concern for GI bleeding.  - Hold sildenafil  with lightheadedness and lower MAP.  - Hold warfarin. INR 2.2 3. Tricuspid regurgitation:  Tricuspid repair not done at time of VAD due to proximity of ICD wires and hypotension during surgery. He has severe TR.  4. Atrial fibrillation: Paroxysmal.  He is off amiodarone  with resolution of  nausea. He was a-paced last office visit but was in AF last hospitalization.  - ECG ordered 5. CKD stage IV: Suspect intra-op hypotension at the time of LVAD placement led to development of ATN => urine sediment looked like ATN per renal. Had initial CVVH and transitioned over to HD. He is now off HD. Creatinine was 2.2 most recently. Worry he could have AKI with possible GI bleeding.  - sCr 2.33, not far from baseline. Will continue to follow - Hold Lasix , gentle IVF as above.  6. CAD: History of PCI to OM1 in 2007 and RCA in 2013.  No CP or ACs.  - Deferred cardiac cath in 12/22 due to AKI and plan for VAD - Continue Crestor .   7. Abnormal thyroid  function tests: Patient has had elevated TSH and free T4, mixed pattern. We have stopped amiodarone . 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  Most recent TSH was normal.  - He follows with endocrinology.   - TSH scheduled for tomorrow 8. Nausea: This seems to have been due to amiodarone , resolved off amiodarone .  Now has recurred, possibly due to upper GI bleeding.  9. 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  INR goal increased to 2.5-3.  - hold warfarin as above.  Ezra Shuck 03/21/2024  Advanced Heart Failure Team Pager [phone] (M-F; 7a - 5p)  Please contact [facility] for night-coverage after hours (4p -7a ) and weekends on [email or web address]

## 2024-03-21 NOTE — Addendum Note (Signed)
 Encounter addended by: Gladis Schuyler BROCKS, RN on: 03/21/2024 4:14 PM  Actions taken: Clinical Note Signed, Order list changed, Medication long-term status modified, Charge Capture section accepted

## 2024-03-21 NOTE — Therapy (Incomplete)
 OUTPATIENT PHYSICAL THERAPY NEURO TREATMENT    Patient Name: Isaac [name] MRN: [id number] DOB:29-Nov-1950, 73 y.o., male Today's Date: 03/21/2024   PCP: Valora Agent, MD REFERRING PROVIDER: Pegge Toribio PARAS, PA-C   END OF SESSION:         Past Medical History:  Diagnosis Date   Arrhythmia    atrial fibrillation   CHF (congestive heart failure) (HCC)    Chronic kidney disease    Coronary artery disease    Hyperlipidemia    Hypertension    Myocardial infarct [facility])    Past Surgical History:  Procedure Laterality Date   CARDIAC DEFIBRILLATOR PLACEMENT  feb 2014   ICD GENERATOR CHANGEOUT N/A 02/09/2024   Procedure: ICD GENERATOR CHANGEOUT;  Surgeon: Waddell Danelle ORN, MD;  Location: [facility] INVASIVE CV LAB;  Service: Cardiovascular;  Laterality: N/A;   INSERTION OF IMPLANTABLE LEFT VENTRICULAR ASSIST DEVICE N/A 09/17/2021   Procedure: INSERTION OF IMPLANTABLE LEFT VENTRICULAR ASSIST DEVICE AND INSERTION OF FEMORAL ARTERIAL LINE;  Surgeon: Lucas Dorise POUR, MD;  Location: [facility] OR;  Service: Open Heart Surgery;  Laterality: N/A;   IR FLUORO GUIDE CV LINE RIGHT  09/27/2021   IR THORACENTESIS ASP PLEURAL SPACE W/IMG GUIDE  09/27/2021   IR US  GUIDE VASC ACCESS RIGHT  09/27/2021   PLACEMENT OF IMPELLA LEFT VENTRICULAR ASSIST DEVICE N/A 09/12/2021   Procedure: PLACEMENT OF IMPELLA 5.5 LEFT VENTRICULAR ASSIST DEVICE;  Surgeon: Kerrin Elspeth BROCKS, MD;  Location: [facility] OR;  Service: Open Heart Surgery;  Laterality: N/A;   RIGHT HEART CATH N/A 09/08/2021   Procedure: RIGHT HEART CATH;  Surgeon: Rolan Ezra RAMAN, MD;  Location: [facility] INVASIVE CV LAB;  Service: Cardiovascular;  Laterality: N/A;   TEE WITHOUT CARDIOVERSION N/A 09/12/2021   Procedure: TRANSESOPHAGEAL ECHOCARDIOGRAM (TEE);  Surgeon: Kerrin Elspeth BROCKS, MD;  Location: [facility] OR;  Service: Open Heart Surgery;  Laterality: N/A;   TEE WITHOUT CARDIOVERSION N/A 09/17/2021   Procedure: TRANSESOPHAGEAL ECHOCARDIOGRAM (TEE);  Surgeon: Lucas Dorise POUR, MD;  Location: [facility] OR;  Service: Open Heart Surgery;  Laterality: N/A;   Patient Active Problem List   Diagnosis Date Noted   Hematoma 02/17/2024   Anemia 02/15/2024   Hypotension 02/15/2024   Sundowning 02/11/2024   Insomnia 02/11/2024   Constipation 02/11/2024   Stage 3b chronic kidney disease (HCC) 02/11/2024   Cardioembolic stroke (HCC) 02/10/2024   Acute left PCA stroke (HCC) 02/05/2024   Abnormal thyroid  blood test 04/05/2023   Abscess of left groin 11/27/2022   VT (ventricular tachycardia) (HCC) 10/27/2022   ICD (implantable cardioverter-defibrillator) in place 10/27/2022   Pressure injury of skin 10/07/2021   LVAD (left ventricular assist device) present (HCC)    Protein-calorie malnutrition, severe 09/10/2021   Elevated TSH    PICC (peripherally inserted central catheter) in place    Cardiogenic shock (HCC)    Syncope 09/07/2021   CHF (congestive heart failure) (HCC) 09/06/2021   CAD (coronary artery disease) 08/22/2021   Cardiomyopathy (HCC) 08/22/2021   Mixed hyperlipidemia 08/22/2021   A-fib (HCC) 08/22/2021   Elevated troponin 08/22/2021   Acute on chronic combined systolic and diastolic CHF (congestive heart failure) (HCC) 08/22/2021   Benign essential hypertension 02/22/2015   Chronic systolic heart failure (HCC) 07/05/2014    ONSET DATE: 02/05/24  REFERRING DIAG:  P36.467 (ICD-10-CM) - Cerebral infarction due to unspecified occlusion or stenosis of left posterior cerebral artery   THERAPY DIAG:  No diagnosis found.  Rationale for Evaluation and Treatment: Rehabilitation  SUBJECTIVE:  SUBJECTIVE STATEMENT:  ***  Pt accompanied by: significant other, [name]  PERTINENT HISTORY:  Presented 02/05/2024 when his pacemaker went off abruptly resulting in a fall as  well as noted right sided weakness and aphasia with blurred vision. Cranial CT scan positive for acute to subacute appearing left PCA left cerebellar infarction. Hyperdense left PCA suggesting occlusion of that vessel. He was also diagnosed with right frontal gyrus MCA infarction. No hemorrhagic transformation or mass effect. Underlying advanced chronic small vessel disease. Right supraorbital forehead recent soft tissue injury with no skull fracture identified. CTA head and neck demonstrating subtotal occlusion of the left PCA in the P2/P3 junction and additionally moderate to severe bilateral ICA siphon atherosclerosis with a least moderate siphon stenosis bilaterally.  Admitted to  rehab 02/10/2024 for inpatient therapies to consist of PT, ST and OT at least three hours five days a week. Past admission physiatrist, therapy team and rehab RN have worked together to provide customized collaborative inpatient rehab.  Pertaining to patient's cardioembolic left cerebellar, PCA and right frontal MCA branch infarction secondary to LVAD device despite being on anticoagulation with warfarin.  Patient remained on Coumadin  therapy goal INR 2.5-3.0.  Atrial fibrillation/VT chronic systolic congestive heart failure status post LVAD 09/17/2021.  Continued Revatio  followed by heart failure team status post generator change 02/09/2024.   PAIN:  Are you having pain? No  PRECAUTIONS: ICD/Pacemaker and Other: LVAD, latex allergy   RED FLAGS: None and LVAD   WEIGHT BEARING RESTRICTIONS: LVAD and pace maker.   FALLS: Has patient fallen in last 6 months? No and pt unsure   LIVING ENVIRONMENT: Lives with: lives with their spouse Lives in: House/apartment Stairs: Yes: Internal: 9 steps; on right going up Has following equipment at home: Single point cane, Walker - 2 wheeled, and does not use  PLOF: Independent with basic ADLs and Independent with household mobility without device  PATIENT GOALS: pt reports that  sight in the L is limited.  Improve balance slightly.   OBJECTIVE:  Note: Objective measures were completed at Evaluation unless otherwise noted.  DIAGNOSTIC FINDINGS:  CT head: IMPRESSION: 1. Expected evolution of Left cerebellar and Left PCA infarcts since yesterday. No hemorrhage or mass effect. 2. Evolution also of a Right MCA middle frontal gyrus infarct predicted by abnormal CTP T-max yesterday. No hemorrhage or mass effect. 3. Consider a recent Embolic event given the recent anterior and posterior vascular territory infarcts.  CT neck  IMPRESSION: 1. CTP does not detect the known Left PCA infarct core (although T-max oligemia corresponds to that finding), while CTA demonstrates subtotal occlusion of the Left PCA at the P2/P3 junction (some distal flow).   2. CTP also suggests Oligemia in the anterior Right MCA division, although no MCA branch occlusion identified by CTA.   3. The above were discussed by telephone with Dr. Rosemarie on 02/05/2024 at 12:36 .   4. Additionally, Moderate to Severe bilateral ICA siphon atherosclerosis with at least Moderate siphon stenosis bilaterally (Moderate to Severe at the Right anterior genu).   5. Extracranial atherosclerosis but no significant Vertebral artery or extracranial carotid stenosis.   6. Aortic Atherosclerosis (ICD10-I70.0) and Emphysema (ICD10-J43.9).  DG chest FINDINGS: Single frontal view of the chest demonstrates stable left ventricular assist device and multi lead pacer/AICD. Cardiac silhouette remains enlarged. No acute airspace disease, effusion, or pneumothorax. No acute bony abnormalities.     COGNITION: Overall cognitive status: delayed response to history questions    SENSATION: WFL  COORDINATION:   Decreased  speed of movement on the RLE>   POSTURE: forward head  LOWER EXTREMITY ROM:     AROM. Grossly WFL.   LOWER EXTREMITY MMT:    MMT Right Eval Left Eval  Hip flexion 4+ 4+  Hip extension     Hip abduction 5 5  Hip adduction 5 5  Hip internal rotation    Hip external rotation    Knee flexion 5 5  Knee extension 5 5  Ankle dorsiflexion 4+ 4+  Ankle plantarflexion    Ankle inversion    Ankle eversion    (Blank rows = not tested)  BED MOBILITY:  Not tested  TRANSFERS: Sit to stand: Complete Independence  Assistive device utilized: None     Stand to sit: Complete Independence  Assistive device utilized: None     Chair to chair: SBA  Assistive device utilized: None       RAMP:  Not tested  CURB:  Findings: CGA 1 UE support   STAIRS: Findings: Level of Assistance: SBA, Stair Negotiation Technique: Step to Pattern with Single Rail on Right Bilateral Rails, Number of Stairs: 8, Height of Stairs: 6   , and Comments: step through ascent step to descent  GAIT: Findings: Gait Characteristics: abducted- Right, Distance walked: 60, Assistive device utilized:None, Level of assistance: SBA and CGA, and Comments: mild adduction on the RLE.   FUNCTIONAL TESTS:  5 times sit to stand: 17.14 Timed up and go (TUG): 13.82 sec no AD 6 minute walk test: to be completed  10 meter walk test: 1.42m/s Berg Balance Scale: To be completed.  Dynamic Gait Index: 18 FGA: 21  PATIENT SURVEYS:  Stroke Impact Scale 71%                                                                                                                              TREATMENT DATE: 03/21/2024  Unless otherwise stated, CGA was provided and gait belt donned in order to ensure pt safety throughout session.  Neuro Re-ed:  Weighted gait with 4# AW with cognitive demand to name foods in alphabetical order 136ft laps in clinic/hallway x5   Airex balance beam: - Forward tandem walking x10 - Side-stepping x10   Blaze pods: Activity Description: 6 pods in a line, pt side-steps to lit up pod, if it lights up green pt taps with R foot, if it lights up pink pt taps with L foot to increase cognitive/motor  dual-task Activity Setting:  The Blaze Pod Random setting was chosen to enhance cognitive processing and agility, providing an unpredictable environment to simulate real-world scenarios, and fostering quick reactions and adaptability.  Number of Pods:  6 Cycles/Sets:  3 Duration (Time or Hit Count):  1:00  Patient Stats  Hits:   Round 1:  Round 2:  Round 3:   Ther-Ex:  Step-ups on 6 step with 4# AW 2x10 for reciprocal step pattern and spatial awareness- pt rates difficulty as medium  Lateral step-ups  on 6 step with 4#  AW 2x10 for spatial awareness and foot placement- instances of R foot catching   STS with weighted medicine ball x10  Seated: - Lateral step over orange hurdle x10 BLE- SPT holding hurdle in place to prevent tipping over when R foot catches on hurdle - Adduction squeezes with 3 second hold x15 - Adduction squeezes with lateral toe taps x10 BLE  PATIENT EDUCATION: Education details: POC. Pt educated throughout session about proper posture and technique with exercises. Improved exercise technique, movement at target joints, use of target muscles after min to mod verbal, visual, tactile cues.  Person educated: Patient Education method: Explanation Education comprehension: verbalized understanding  HOME EXERCISE PROGRAM:  Access Code: [email or web address] URL: [email or web address] Date: 03/02/2024 Prepared by: Connell Kiss  Exercises - Tandem Stance with Support  - 1 x daily - 4 x weekly - 3 sets - 4 reps - 20 seconds  hold - Forward and Backward Walking with Eyes Closed and Counter Support  - 1 x daily - 4 x weekly - 3 sets - 5 reps - Side Stepping with Resistance at Thighs and Counter Support  - 1 x daily - 7 x weekly - 2 sets - 10 reps - Forward Step Up with Unilateral Counter Support  - 1 x daily - 7 x weekly - 1-2 sets - 10 reps - Walking  - 3 x daily - 7 x weekly - 1 sets - 6 minutes hold  GOALS: Goals reviewed with patient? Yes  SHORT TERM GOALS:  Target date: 03/29/2024    Patient will be independent in home exercise program to improve strength/mobility for better functional independence with ADLs. Baseline: provided on 6/4 Goal status: INITIAL   LONG TERM GOALS: Target date: 05/17/2024    Patient will increase SIS 16 score to equal to or greater than  10 points.    to demonstrate statistically significant improvement in mobility and quality of life.  Baseline: 71% Goal status: INITIAL  2.  Patient (> 40 years old) will complete five times sit to stand test in < 15 seconds indicating an increased LE strength and improved balance. Baseline: 17.14sec Goal status: INITIAL  3.  Patient will increase Berg Balance score by > 6 points to demonstrate decreased fall risk during functional activities Baseline: to be completed  Goal status: INITIAL   4.  Patient will reduce timed up and go to <11 seconds to reduce fall risk and demonstrate improved transfer/gait ability. Baseline: 13.82 seconds  Goal status: INITIAL  5.  Patient will increase FGA  score to >/24 as to demonstrate reduced fall risk and improved dynamic gait balance for better safety with community/home ambulation.   Baseline: 21 Goal status: INITIAL  6.  Patient will increase 6 min walk test >115ft to demonstrate decreased fall risk during functional activities as well as improved access to community mobility.  Baseline: to be completed ; 02/29/24: 1165' Goal status: INITIAL   ASSESSMENT:  CLINICAL IMPRESSION:   *** Pt will benefit from continued skilled PT to allow return to PLOF and increased overall QoL.    OBJECTIVE IMPAIRMENTS: Abnormal gait, cardiopulmonary status limiting activity, decreased activity tolerance, decreased balance, decreased coordination, decreased endurance, decreased mobility, difficulty walking, decreased strength, and improper body mechanics.   ACTIVITY LIMITATIONS: carrying, lifting, squatting, stairs, transfers, locomotion level, and  caring for others  PARTICIPATION LIMITATIONS: cleaning, laundry, driving, shopping, community activity, occupation, and yard work  PERSONAL FACTORS: Age and 3+ comorbidities: LVAD, Pacemaker, hypotension  are  also affecting patient's functional outcome.   REHAB POTENTIAL: Excellent  CLINICAL DECISION MAKING: Stable/uncomplicated  EVALUATION COMPLEXITY: Low  PLAN:  PT FREQUENCY: 1-2x/week  PT DURATION: 12 weeks  PLANNED INTERVENTIONS: 97164- PT Re-evaluation, 97750- Physical Performance Testing, 97110-Therapeutic exercises, 97530- Therapeutic activity, 97112- Neuromuscular re-education, 97535- Self Care, 02859- Manual therapy, [phone]- Gait training, Patient/Family education, Balance training, Stair training, Taping, DME instructions, Cryotherapy, and Moist heat  PLAN FOR NEXT SESSION:   - higher level dynamic balance challenges - R LE strengthening - higher level dynamic gait challenges   - include unstable surfaces, stepping over obstacles, dual-tasks for cognitive/speech challenges - balance with reduced BOS  Continue progressing  Aksel Bencomo, SPT  This entire session was performed under direct supervision and direction of a licensed therapist/therapist assistant . I have personally read, edited and approve of the note as written.  Marina  Leopoldo, PT, DPT Physical Therapist - [facility]  Outpatient Physical Therapy- Main Campus [phone]    8:24 AM 03/21/24

## 2024-03-21 NOTE — Progress Notes (Signed)
Patient presents for sick visit today in VAD Clinic with his wife [name]. Denies issues/concerns with VAD equipment or drive line.    Pt's wife paged VAD Coordinator this afternoon stating pt has been having increased fatigue and dizziness since Sunday and sustained a fall this morning. Denies any head trauma. Discussed with Dr. Rolan and decision made to bring to VAD Clinic this afternoon for further evaluation.  VAD Coordinator met pt in the waiting room. Pt very pale on arrival and states he has noticed increased fatigue since Sunday and feels very dizzy today. Pt's wife reports that he has had an unsteady gait today at home. Pt reports noticing dark sticky stools. No frank blood. Once he laid on the stretcher he continued to run his left upper abdomen and reporting pain of 8/10.  Pt admitted 02/10/24-02/29/24 for acute CVA to L PCA, L cerebral and R MCA stroke. Wife reports patient has been improving physically post CVA and is currently going to Neuro Rehab twice weekly.  She reports his cognitive issues remain. Noted continued right sided weakness and expressive aphasia in clinic today. INR goal increased to 2.5-3 at discharge. Pt subtherapeutic 6/20 where daily Lovenox  injections were started through 6/24. Wife reports pt being off since those injections.   Dr. Rolan advised that he suspects pt may have GI bleed and plans to admit to 2H. 20g IV started in left forearm by VAD Coordinator. STAT labs drawn as well as a type and screen. 500cc bolus given in VAD Clinic. Pt reports nausea. 4mg  IV Zofran  given per Dr. Rolan. Plans for admission with GI consult and CT chest abdomen pelvis WO. Report given Rosina PEAK and pt transferred to [facility]. Hgb resulted at 6.4. 2 units PRBCs ordered by AHF team.  Vital Signs:  Doppler Pressure: 98 Automatic BP: 97/78 (83) HR: 83 SPO2: 99 RA   Weight: 178 lb w/ eqt Last weight: 178.4 lb w/eqt   VAD Indication: Destination Therapy due to age and kidney disease    LVAD assessment:  HM III: Speed: 5300 rpms Flow: 4.1 Power: 3.7 w    PI: 3.8 Alarms: none Events: 50 + PI events  Fixed speed: 5300 Low speed limit: 5000  Primary controller: back up battery due for replacement in 9 months Secondary controller:  back up battery due for replacement in 4 months.    I reviewed the LVAD parameters from today and compared the results to the patient's prior recorded data. LVAD interrogation was NEGATIVE for significant power changes, NEGATIVE for clinical alarms and STABLE for PI events/speed drops. No programming changes were made and pump is functioning within specified parameters. Pt is performing daily controller and system monitor self tests along with completing weekly and monthly maintenance for LVAD equipment.   LVAD equipment check completed and is in good working order. Back-up equipment present. Charged back up battery and performed self-test on equipment.     Exit Site Care: Wife has been performing weekly dressing changes using weekly dressings. Dressing CDI. Anchor secure.  Device:  [employer] dual  Therapies: on Pacing: VVI 50 Last check: 02/09/24   BP & Labs:  Doppler 98 - Doppler is reflecting  modified systolic   Hgb 6.4 - Reporting dark stool. Denies other sources of bleeding.    LDH stable at 124 with established baseline of 200 - 300. Denies tea-colored urine. No power elevations noted on interrogation.   Patient Instructions:  Admit to 2H  Schuyler Lunger RN, BSN VAD Coordinator 24/7 Pager  [phone]        

## 2024-03-22 ENCOUNTER — Inpatient Hospital Stay (HOSPITAL_COMMUNITY): Admitting: Certified Registered"

## 2024-03-22 ENCOUNTER — Encounter (HOSPITAL_COMMUNITY): Admission: EM | Disposition: A | Discharge status: Home / Self Care | Payer: Self-pay | Attending: Cardiology

## 2024-03-22 ENCOUNTER — Encounter (HOSPITAL_COMMUNITY): Payer: Self-pay | Admitting: Cardiology

## 2024-03-22 DIAGNOSIS — D62 Acute posthemorrhagic anemia: Secondary | ICD-10-CM

## 2024-03-22 DIAGNOSIS — Z95811 Presence of heart assist device: Secondary | ICD-10-CM | POA: Diagnosis not present

## 2024-03-22 DIAGNOSIS — R1013 Epigastric pain: Secondary | ICD-10-CM

## 2024-03-22 DIAGNOSIS — I110 Hypertensive heart disease with heart failure: Secondary | ICD-10-CM

## 2024-03-22 DIAGNOSIS — K31819 Angiodysplasia of stomach and duodenum without bleeding: Secondary | ICD-10-CM | POA: Diagnosis not present

## 2024-03-22 DIAGNOSIS — K3189 Other diseases of stomach and duodenum: Secondary | ICD-10-CM

## 2024-03-22 DIAGNOSIS — I5043 Acute on chronic combined systolic (congestive) and diastolic (congestive) heart failure: Secondary | ICD-10-CM

## 2024-03-22 DIAGNOSIS — K922 Gastrointestinal hemorrhage, unspecified: Secondary | ICD-10-CM | POA: Diagnosis not present

## 2024-03-22 DIAGNOSIS — K921 Melena: Secondary | ICD-10-CM

## 2024-03-22 DIAGNOSIS — K5520 Angiodysplasia of colon without hemorrhage: Secondary | ICD-10-CM

## 2024-03-22 DIAGNOSIS — K5521 Angiodysplasia of colon with hemorrhage: Secondary | ICD-10-CM

## 2024-03-22 DIAGNOSIS — I5022 Chronic systolic (congestive) heart failure: Secondary | ICD-10-CM

## 2024-03-22 DIAGNOSIS — Z7901 Long term (current) use of anticoagulants: Secondary | ICD-10-CM

## 2024-03-22 DIAGNOSIS — R110 Nausea: Secondary | ICD-10-CM

## 2024-03-22 DIAGNOSIS — I2510 Atherosclerotic heart disease of native coronary artery without angina pectoris: Secondary | ICD-10-CM | POA: Diagnosis not present

## 2024-03-22 LAB — BASIC METABOLIC PANEL WITH GFR
Anion gap: 8 (ref 5–15)
Anion gap: 5 (ref 5–15)
BUN: 60 mg/dL — ABNORMAL HIGH (ref 8–23)
BUN: 60 mg/dL — ABNORMAL HIGH (ref 8–23)
CO2: 21 mmol/L — ABNORMAL LOW (ref 22–32)
CO2: 22 mmol/L (ref 22–32)
Calcium: 8.4 mg/dL — ABNORMAL LOW (ref 8.9–10.3)
Calcium: 8.3 mg/dL — ABNORMAL LOW (ref 8.9–10.3)
Chloride: 108 mmol/L (ref 98–111)
Chloride: 109 mmol/L (ref 98–111)
Creatinine, Ser: 2.25 mg/dL — ABNORMAL HIGH (ref 0.61–1.24)
Creatinine, Ser: 2.48 mg/dL — ABNORMAL HIGH (ref 0.61–1.24)
GFR, Estimated: 30 mL/min — ABNORMAL LOW (ref >60)
GFR, Estimated: 27 mL/min — ABNORMAL LOW (ref >60)
Glucose, Bld: 89 mg/dL (ref 70–99)
Glucose, Bld: 112 mg/dL — ABNORMAL HIGH (ref 70–99)
Potassium: 4.2 mmol/L (ref 3.5–5.1)
Potassium: 4.1 mmol/L (ref 3.5–5.1)
Sodium: 137 mmol/L (ref 135–145)
Sodium: 136 mmol/L (ref 135–145)

## 2024-03-22 LAB — LACTATE DEHYDROGENASE: LDH: 102 U/L (ref 98–192)

## 2024-03-22 LAB — PROTIME-INR
INR: 2.3 — ABNORMAL HIGH (ref 0.8–1.2)
Prothrombin Time: 26.7 s — ABNORMAL HIGH (ref 11.4–15.2)

## 2024-03-22 LAB — CBC
HCT: 20.3 % — ABNORMAL LOW (ref 39.0–52.0)
HCT: 22.8 % — ABNORMAL LOW (ref 39.0–52.0)
Hemoglobin: 6.4 g/dL — CL (ref 13.0–17.0)
Hemoglobin: 7.4 g/dL — ABNORMAL LOW (ref 13.0–17.0)
MCH: 30.8 pg (ref 26.0–34.0)
MCH: 29.8 pg (ref 26.0–34.0)
MCHC: 31.5 g/dL (ref 30.0–36.0)
MCHC: 32.5 g/dL (ref 30.0–36.0)
MCV: 97.6 fL (ref 80.0–100.0)
MCV: 91.9 fL (ref 80.0–100.0)
Platelets: 241 K/uL (ref 150–400)
Platelets: 190 10*3/uL (ref 150–400)
RBC: 2.08 MIL/uL — ABNORMAL LOW (ref 4.22–5.81)
RBC: 2.48 MIL/uL — ABNORMAL LOW (ref 4.22–5.81)
RDW: 16.8 % — ABNORMAL HIGH (ref 11.5–15.5)
RDW: 18 % — ABNORMAL HIGH (ref 11.5–15.5)
WBC: 6.5 K/uL (ref 4.0–10.5)
WBC: 5.7 10*3/uL (ref 4.0–10.5)
nRBC: 0 % (ref 0.0–0.2)
nRBC: 0 % (ref 0.0–0.2)

## 2024-03-22 LAB — PREPARE RBC (CROSSMATCH)

## 2024-03-22 LAB — HEMOGLOBIN AND HEMATOCRIT, BLOOD
HCT: 24.8 % — ABNORMAL LOW (ref 39.0–52.0)
Hemoglobin: 8.1 g/dL — ABNORMAL LOW (ref 13.0–17.0)

## 2024-03-22 LAB — MAGNESIUM: Magnesium: 2 mg/dL (ref 1.7–2.4)

## 2024-03-22 LAB — TSH: TSH: 2.542 u[IU]/mL (ref 0.350–4.500)

## 2024-03-22 SURGERY — ENTEROSCOPY
Anesthesia: Monitor Anesthesia Care

## 2024-03-22 MED ORDER — SODIUM CHLORIDE 0.9% IV SOLUTION: Freq: Once | INTRAVENOUS | Status: AC

## 2024-03-22 MED ORDER — PANTOPRAZOLE SODIUM 40 MG IV SOLR
40.0000 mg | Freq: Two times a day (BID) | INTRAVENOUS | Status: DC
  Administered 2024-03-22 – 2024-03-23 (×3): 40 mg via INTRAVENOUS
  Filled 2024-03-22 (×3): qty 10

## 2024-03-22 MED ORDER — PROPOFOL 10 MG/ML IV BOLUS
INTRAVENOUS | Status: DC | PRN
Start: 2024-03-22 — End: 2024-03-22
  Administered 2024-03-22: 50 mg via INTRAVENOUS

## 2024-03-22 MED ORDER — LIDOCAINE 2% (20 MG/ML) 5 ML SYRINGE
INTRAMUSCULAR | Status: DC | PRN
  Administered 2024-03-22: 60 mg via INTRAVENOUS

## 2024-03-22 MED ORDER — STERILE WATER FOR IRRIGATION IR SOLN
Status: DC | PRN
  Administered 2024-03-22: 50 mL

## 2024-03-22 MED ORDER — POLYETHYLENE GLYCOL 3350 17 G PO PACK
17.0000 g | PACK | Freq: Two times a day (BID) | ORAL | Status: DC
  Administered 2024-03-22 – 2024-03-26 (×3): 17 g via ORAL
  Filled 2024-03-22 (×7): qty 1

## 2024-03-22 MED ORDER — SODIUM CHLORIDE 0.9 % IV SOLN
INTRAVENOUS | Status: AC | PRN
  Administered 2024-03-22: 1000 mL via INTRAVENOUS

## 2024-03-22 MED ORDER — PROPOFOL 500 MG/50ML IV EMUL
INTRAVENOUS | Status: DC | PRN
  Administered 2024-03-22: 125 ug/kg/min via INTRAVENOUS

## 2024-03-22 MED ORDER — SENNOSIDES-DOCUSATE SODIUM 8.6-50 MG PO TABS
2.0000 | ORAL_TABLET | Freq: Every day | ORAL | Status: DC
  Administered 2024-03-22 – 2024-03-25 (×3): 2 via ORAL
  Filled 2024-03-22 (×4): qty 2

## 2024-03-22 MED ORDER — PHENYLEPHRINE HCL-NACL 20-0.9 MG/250ML-% IV SOLN
INTRAVENOUS | Status: DC | PRN
  Administered 2024-03-22: 50 ug/min via INTRAVENOUS

## 2024-03-22 MED ORDER — PHENYLEPHRINE HCL (PRESSORS) 10 MG/ML IV SOLN
INTRAVENOUS | Status: DC | PRN
  Administered 2024-03-22: 80 ug via INTRAVENOUS

## 2024-03-22 NOTE — TOC Initial Note (Signed)
 Transition of Care [facility]) - Initial/Assessment Note    Patient Details  Name: Isaac [name] MRN: [id number] Date of Birth: Aug 25, 1951  [facility] of [facility]) CM/SW Contact:    Isaac [name] Phone Number: [phone] 03/22/2024, 1:09 PM  Clinical Narrative: 11:59 AM- HF CSW attempted to meet with patient at bedside. Patient was being transported out of the room. HF CSW/NCM will follow up with patient at a more appropriate time.   TOC will continue following.                         Patient Goals and CMS Choice            Expected Discharge Plan and Services                                              Prior Living Arrangements/Services                       Activities of Daily Living      Permission Sought/Granted                  Emotional Assessment              Admission diagnosis:  GI bleed [K92.2] Patient Active Problem List   Diagnosis Date Noted   Melena 03/22/2024   Acute post-hemorrhagic anemia 03/22/2024   Anticoagulated 03/22/2024   GI bleed 03/21/2024   Hematoma 02/17/2024   Anemia 02/15/2024   Hypotension 02/15/2024   Sundowning 02/11/2024   Insomnia 02/11/2024   Constipation 02/11/2024   Stage 3b chronic kidney disease (HCC) 02/11/2024   Cardioembolic stroke (HCC) 02/10/2024   Acute left PCA stroke (HCC) 02/05/2024   Abnormal thyroid  blood test 04/05/2023   Abscess of left groin 11/27/2022   VT (ventricular tachycardia) (HCC) 10/27/2022   ICD (implantable cardioverter-defibrillator) in place 10/27/2022   Pressure injury of skin 10/07/2021   LVAD (left ventricular assist device) present (HCC)    Protein-calorie malnutrition, severe 09/10/2021   Elevated TSH    PICC (peripherally inserted central catheter) in place    Cardiogenic shock (HCC)    Syncope 09/07/2021   CHF (congestive heart failure) (HCC) 09/06/2021   CAD (coronary artery disease) 08/22/2021   Cardiomyopathy (HCC) 08/22/2021    Mixed hyperlipidemia 08/22/2021   A-fib (HCC) 08/22/2021   Elevated troponin 08/22/2021   Acute on chronic combined systolic and diastolic CHF (congestive heart failure) (HCC) 08/22/2021   Benign essential hypertension 02/22/2015   Chronic systolic heart failure (HCC) 07/05/2014   PCP:  Valora Agent, MD Pharmacy:   [facility] [phone] Isaac [name], [place] - [address] [place] [address] Phone: [phone] Fax: [phone]  [facility] [address] [place] - [address] [place] [address] Phone: [phone] Fax: [phone]  [name] [address] [place] [address] Phone: [phone] Fax: [phone]     Social Drivers of Health (SDOH) Social History: SDOH Screenings   Food Insecurity: No Food Insecurity (03/21/2024)  Housing: Low Risk  (03/21/2024)  Transportation Needs: No Transportation Needs (03/21/2024)  Utilities: Not At Risk (03/21/2024)  Depression (PHQ2-9): Low Risk  (04/14/2022)  Social Connections: Moderately Integrated (03/21/2024)  Recent Concern: Social Connections - Moderately Isolated (02/05/2024)  Tobacco Use: Medium Risk (03/22/2024)  SDOH Interventions:     Readmission Risk Interventions     No data to display

## 2024-03-22 NOTE — Progress Notes (Addendum)
 Advanced Heart Failure Rounding Note  Cardiologist: Ezra Shuck, MD  Chief Complaint: GIB Subjective:    S/p 2u PRBcs yesterday for Hgb 6.4. Hgb 7.4 this morning.   Patient and wife very hesitant at this time for EGD. They want further discussion with MD.   Denies further bleeding   LVAD Interrogation HM III: Speed: 5300 Flow: 4.1 PI: 3.5 Power: 3.8. ~20 PI events  Objective:   Weight Range: 79.8 kg Body mass index is 25.98 kg/m.   Vital Signs:   Temp:  [97.8 F (36.6 C)-98.6 F (37 C)] 97.8 F (36.6 C) (07/02 0700) Pulse Rate:  [60-160] 70 (07/02 0600) Resp:  [12-22] 15 (07/02 0600) BP: (77-92)/(51-74) 82/65 (07/02 0600) SpO2:  [93 %-100 %] 98 % (07/02 0600) Weight:  [79.8 kg] 79.8 kg (07/02 0500) Last BM Date :  (PTA)  Weight change: Filed Weights   03/22/24 0500  Weight: 79.8 kg   Intake/Output:   Intake/Output Summary (Last 24 hours) at 03/22/2024 0859 Last data filed at 03/22/2024 0630 Gross per 24 hour  Intake 555 ml  Output 1350 ml  Net -795 ml    Physical Exam  General:  Well appearing. No resp difficulty Neck:  JVP flat.  Cor: Mechanical heart sounds with LVAD hum present. Lungs: Clear Abdomen: soft, nontender, nondistended. Good bowel sounds. Driveline: C/D/I; securement device intact and driveline incorporated Neuro: alert & oriented x3. Moves all 4 extremities w/o difficulty. Affect pleasant    Telemetry   NSR 60s (Personally reviewed)    EKG    No new EKG to review  Labs    CBC Recent Labs    03/21/24 1500 03/22/24 0228  WBC 6.5 5.7  HGB 6.4* 7.4*  HCT 20.3* 22.8*  MCV 97.6 91.9  PLT 241 190   Basic Metabolic Panel Recent Labs    92/98/74 1500 03/22/24 0228  NA 133* 137  K 3.8 4.2  CL 102 108  CO2 20* 21*  GLUCOSE 99 89  BUN 63* 60*  CREATININE 2.33* 2.25*  CALCIUM  9.3 8.4*   Liver Function Tests Recent Labs    03/21/24 1500  AST 23  ALT 16  ALKPHOS 62  BILITOT 0.8  PROT 6.6  ALBUMIN  3.5   No  results for input(s): LIPASE, AMYLASE in the last 72 hours. Cardiac Enzymes No results for input(s): CKTOTAL, CKMB, CKMBINDEX, TROPONINI in the last 72 hours.  BNP: BNP (last 3 results) Recent Labs    02/05/24 1106  BNP 343.8*   ProBNP (last 3 results) No results for input(s): PROBNP in the last 8760 hours.  D-Dimer No results for input(s): DDIMER in the last 72 hours. Hemoglobin A1C No results for input(s): HGBA1C in the last 72 hours. Fasting Lipid Panel No results for input(s): CHOL, HDL, LDLCALC, TRIG, CHOLHDL, LDLDIRECT in the last 72 hours. Thyroid  Function Tests Recent Labs    03/22/24 0228  TSH 2.542   Other results:  Imaging   CT CHEST ABDOMEN PELVIS WO CONTRAST Result Date: 03/21/2024 CLINICAL DATA:  Concern for GI bleed * Tracking Code: BO * EXAM: CT CHEST, ABDOMEN AND PELVIS WITHOUT CONTRAST TECHNIQUE: Multidetector CT imaging of the chest, abdomen and pelvis was performed following the standard protocol without IV contrast. RADIATION DOSE REDUCTION: This exam was performed according to the departmental dose-optimization program which includes automated exposure control, adjustment of the mA and/or kV according to patient size and/or use of iterative reconstruction technique. COMPARISON:  09/09/2021 FINDINGS: CT CHEST FINDINGS Cardiovascular: Aortic atherosclerosis.  Cardiomegaly. Three-vessel coronary artery calcifications. LVAD. No pericardial effusion. Mediastinum/Nodes: No enlarged mediastinal, hilar, or axillary lymph nodes. Thyroid  gland, trachea, and esophagus demonstrate no significant findings. Lungs/Pleura: Moderate centrilobular emphysema. Diffuse bilateral bronchial wall thickening. No pleural effusion or pneumothorax. Musculoskeletal: No chest wall abnormality. No acute osseous findings. CT ABDOMEN PELVIS FINDINGS Hepatobiliary: No solid liver abnormality is seen. Gallstones. No gallbladder wall thickening, or biliary dilatation.  Pancreas: Unremarkable. No pancreatic ductal dilatation or surrounding inflammatory changes. Spleen: Normal in size without significant abnormality. Adrenals/Urinary Tract: Unchanged, definitively benign small bilateral adrenal adenomata, for which no further follow-up or characterization is required. Kidneys are normal, without renal calculi, solid lesion, or hydronephrosis. Bladder is unremarkable. Stomach/Bowel: Stomach is within normal limits. Appendix appears normal. No evidence of bowel wall thickening, distention, or inflammatory changes. Moderate burden of stool throughout the colon and rectum. Vascular/Lymphatic: Aortic atherosclerosis. No enlarged abdominal or pelvic lymph nodes. Reproductive: Prostatomegaly. Other: Fat containing left inguinal hernia.  No ascites. Musculoskeletal: No acute osseous findings. IMPRESSION: 1. No acute noncontrast CT abnormality of the chest, abdomen, or pelvis. No specific noncontrast CT findings to explain GI bleed. 2. Moderate burden of stool throughout the colon and rectum. 3. Emphysema and diffuse bilateral bronchial wall thickening. 4. Cardiomegaly and coronary artery disease. 5. Cholelithiasis. 6. Prostatomegaly. Aortic Atherosclerosis (ICD10-I70.0) and Emphysema (ICD10-J43.9). Electronically Signed   By: Marolyn JONETTA Jaksch M.D.   On: 03/21/2024 21:51   DG CHEST PORT 1 VIEW Result Date: 03/21/2024 CLINICAL DATA:  Heart failure.  LVAD. EXAM: PORTABLE CHEST 1 VIEW COMPARISON:  Chest x-ray 02/05/2024 FINDINGS: Sternal wires again identified. Left upper chest battery pack with defibrillator leads along the heart. Is also a left ventricular assist device overlying the apex of heart on the left side. Heart itself is enlarged with some vascular congestion. No pneumothorax or effusion. No consolidation. Overlapping cardiac leads. Surgical changes along the right upper chest. IMPRESSION: Postop chest with enlarged heart and vascular congestion. Defibrillator and left ventricular  assist device. Electronically Signed   By: Ranell Bring M.D.   On: 03/21/2024 17:21    Medications:   Scheduled Medications:  Chlorhexidine  Gluconate Cloth  6 each Topical Daily   pantoprazole   40 mg Oral BID    Infusions:   PRN Medications: acetaminophen , ondansetron  (ZOFRAN ) IV   Patient Profile   73 y.o. with history of CAD, HTN, atrial fibrillation, and chronic systolic CHF s/p LVAD HM3. Presenting with GIB.   Assessment/Plan  1. GIB: Patient has epigastric pain, nausea, and melena x 1 week after getting Lovenox  for several days.  INR goal has been increased to 2.5-3 since his CVA. Reports lightheadedness. Hgb 6.4 on admission.  - Transfused 2u RBCs yesterday. Hgb 7.4 this morning. Plan for 1 more unit today - Continue protonix  40 mg bid - GI consulted; INR 2.3, likely can scope today. Holding warfarin - Plan to lower INR goal to 2-2.5 per Dr. Rolan - Holding lasix  - Plan for possible octreotide at discharge - CT chest/abdomen/pelvis: no specific noncontrast CT findings to explain GI bleed.   2. Chronic systolic CHF:  Long-standing cardiomyopathy.  [employer] dual chamber ICD.  Echo in 12/22 with EF < 20%, severe LV dilation, restrictive diastolic function, moderate RV dysfunction, moderate MR, mod-severe TR. Cause of cardiomyopathy is uncertain.  He has a history of CAD, but I do not think that the described CAD from the past could explain his cardiomyopathy, but CAD could have progressed.  He was admitted in 12/22 with cardiogenic shock  and Impella 5.5. s/p HM III VAD on 09/17/22.  Post-op course complicated by AKI and RV failure. He was on HD for several months but is now off.  Echo in 7/24 showed EF < 20%, IV septum midline to slightly leftward, RV moderately dilated with moderately decreased systolic function, IVC dilated with severe TR.  Echo in 5/25 with EF 20-25%, mild LVH, LVAD present, mild RV dysfunction, moderate AI, mild RV dysfunction with normal RV size,  severe TR.   - NYHA class II, not significantly volume overloaded on exam.  Last creatinine was 2.25. LVAD parameters reviewed and stable today, no alarms.  - Holding Lasix  and KCl with concern for GI bleeding.  - Hold sildenafil  with lightheadedness and lower MAP.  - Hold warfarin. INR 2.3  3. Tricuspid regurgitation:  Tricuspid repair not done at time of VAD due to proximity of ICD wires and hypotension during surgery. He has severe TR.   4. Atrial fibrillation: Paroxysmal.  He is off amiodarone  with resolution of nausea. He was a-paced last office visit but was in AF last hospitalization.  - ECG with AV paced  5. CKD stage IV: Suspect intra-op hypotension at the time of LVAD placement led to development of ATN => urine sediment looked like ATN per renal. Had initial CVVH and transitioned over to HD. He is now off HD. Creatinine was 2.2 most recently. Worry he could have AKI with possible GI bleeding.  - sCr 2.33, not far from baseline. Will continue to follow - Hold Lasix , gentle IVF as above.   6. CAD: History of PCI to OM1 in 2007 and RCA in 2013.  No CP or ACs.  - Deferred cardiac cath in 12/22 due to AKI and plan for VAD - Continue Crestor .    7. Abnormal thyroid  function tests: Patient has had elevated TSH and free T4, mixed pattern. We have stopped amiodarone . 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  Most recent TSH was normal.  - He follows with endocrinology.   - TSH stable 2.5 this admission  8. Nausea: This seems to have been due to amiodarone , resolved off amiodarone .  Now has recurred, possibly due to upper GI bleeding. None at this time.   9. 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  INR goal increased to 2.5-3. May need to decrease INR goal to 2-2.5.  - hold warfarin as above.  CRITICAL CARE Performed by: Beckey LITTIE Coe   Total critical care time: 18 minutes  Critical care time  was exclusive of separately billable procedures and treating other patients.  Critical care was necessary to treat or prevent imminent or life-threatening deterioration.  Critical care was time spent personally by me on the following activities: development of treatment plan with patient and/or surrogate as well as nursing, discussions with consultants, evaluation of patient's response to treatment, examination of patient, obtaining history from patient or surrogate, ordering and performing treatments and interventions, ordering and review of laboratory studies, ordering and review of radiographic studies, pulse oximetry and re-evaluation of patient's condition.   Length of Stay: 1  Beckey LITTIE Coe, NP  03/22/2024, 8:59 AM  Advanced Heart Failure Team Pager [phone] (M-F; 7a - 5p)  Please contact [facility] for night-coverage after hours (5p -7a ) and weekends on [email or web address]   Patient seen with NP, I formulated the plan and agree with the above note.   No further episodes of melena but hgb 7.4 after  2 units PRBCs.  MAP stable 70-90 range.  Denies dyspnea at rest.  CT chest/abd/pelvis unremkarkable.   He is alternating a-pacing and NSR.    General: Well appearing this am. NAD.  HEENT: Normal. Neck: Supple, JVP 8-9 cm. Carotids OK.  Cardiac:  Mechanical heart sounds with LVAD hum present.  Lungs:  CTAB, normal effort.  Abdomen:  NT, ND, no HSM. No bruits or masses. +BS  LVAD exit site: Well-healed and incorporated. Dressing dry and intact. No erythema or drainage. Stabilization device present and accurately applied. Driveline dressing changed daily per sterile technique. Extremities:  Warm and dry. No cyanosis, clubbing, rash, or edema.  Neuro:  Alert & oriented x 3. Cranial nerves grossly intact. Moves all 4 extremities w/o difficulty. Affect pleasant    Patient needs EGD, concern for AVM bleeding.  He is reluctant for EGD, but after extensive discussion he is agreeable.  - Will decrease  goal INR to 2-2.5.  - Discussed with Dr. Leigh, plan EGD today (NPO).  - If AVM found, will need outpatient octreotide injections monthly.  - Transfuse 1 unit PRBCs this morning.  - Continue Protonix .  - Hold Lasix  again today, probably restart tomorrow.   LVAD parameters reviewed, about 20 PI events in setting of GI bleeding.  Flow stable.   CRITICAL CARE Performed by: Ezra Shuck  Total critical care time: 35 minutes  Critical care time was exclusive of separately billable procedures and treating other patients.  Critical care was necessary to treat or prevent imminent or life-threatening deterioration.  Critical care was time spent personally by me on the following activities: development of treatment plan with patient and/or surrogate as well as nursing, discussions with consultants, evaluation of patient's response to treatment, examination of patient, obtaining history from patient or surrogate, ordering and performing treatments and interventions, ordering and review of laboratory studies, ordering and review of radiographic studies, pulse oximetry and re-evaluation of patient's condition.  Ezra Shuck 03/22/2024 9:48 AM

## 2024-03-22 NOTE — Op Note (Signed)
 [facility] Patient Name: Isaac [name] Procedure Date : 03/22/2024 MRN: [id number] Attending MD: Elspeth SQUIBB. Leigh , MD, [id number] Date of Birth: 1950/11/25 CSN: [id number] Age: 73 Admit Type: Inpatient Procedure:                Small bowel enteroscopy Indications:              Melena - history of LVAD on coumadin , INR 2.3 Providers:                Elspeth SQUIBB. Leigh, MD, Ozell Pouch, [place], Technician Referring MD:             Ezra CANDIE Shuck, MD Medicines:                Monitored Anesthesia Care Complications:            No immediate complications. Estimated blood loss:                            Minimal. Estimated Blood Loss:     Estimated blood loss was minimal. Procedure:                Pre-Anesthesia Assessment:                           - Prior to the procedure, a History and Physical                            was performed, and patient medications and                            allergies were reviewed. The patient's tolerance of                            previous anesthesia was also reviewed. The risks                            and benefits of the procedure and the sedation                            options and risks were discussed with the patient.                            All questions were answered, and informed consent                            was obtained. Prior Anticoagulants: The patient has                            taken Coumadin  (warfarin), last dose was 1 day                            prior to procedure. ASA Grade Assessment: IV - A  patient with severe systemic disease that is a                            constant threat to life. After reviewing the risks                            and benefits, the patient was deemed in                            satisfactory condition to undergo the procedure.                           After obtaining informed consent, the endoscope  was                            passed under direct vision. Throughout the                            procedure, the patient's blood pressure, pulse, and                            oxygen saturations were monitored continuously. The                            [id number]-H190TL ([id number] Olympus slim colonoscope was                            introduced through the mouth and advanced to the                            proximal jejunum. The small bowel enteroscopy was                            accomplished without difficulty. The patient                            tolerated the procedure well. Scope In: Scope Out: Findings:      The Z-line was regular.      The exam of the esophagus was otherwise normal.      A single small angiodysplastic lesion with no bleeding was found in the       gastric antrum. Fulguration to ablate the lesion to prevent bleeding by       argon plasma was successful.      The exam of the stomach was otherwise normal.      Two angiodysplastic lesions with no bleeding were found in the second       portion of the duodenum. Fulguration to ablate the lesion to prevent       bleeding by argon plasma was successful.      Patchy friable mucosa without active bleeding was found in the second       portion of the duodenum. There was red heme superficially in this area.       Fulguration to ablate the lesion to prevent bleeding by argon plasma was       successful.  The exam of the duodenum was otherwise normal.      A single angiodysplastic lesion with no bleeding was found in the       proximal jejunum. Fulguration to ablate the lesion to prevent bleeding       by argon plasma was successful.      Exam of the jejunum was otherwise normal. Impression:               - Z-line regular.                           - Normal esophagus otherwise.                           - A single non-bleeding angiodysplastic lesion in                            the stomach. Treated with argon  plasma coagulation                            (APC).                           - Normal stomach otherwise.                           - Two non-bleeding angiodysplastic lesions in the                            duodenum. Treated with argon plasma coagulation                            (APC).                           - Patchy friable duodenal mucosa with some red heme                            in the area. Treated with argon plasma coagulation                            (APC).                           - A single non-bleeding angiodysplastic lesion in                            the jejunum. Treated with argon plasma coagulation                            (APC).                           - Normal jejunum otherwise.                           Small amount of red heme in the 2nd portion  duodenum near friable erythematous mucosa which was                            patchy, as well as 2 AVMs. 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 Recommendation:           - Return patient to ICU for ongoing care.                           - Clear liquid diet today                           - Continue present medications.                           - Continue protonix  40mg  IV BID                           - Keep INR at lowest acceptable range                           - Serial Hgb, monitor for further bleeding Procedure Code(s):        --- Professional ---                           [phone], Small intestinal endoscopy, enteroscopy                            beyond second portion of duodenum, not including                            ileum; with control of bleeding (eg, injection,                            bipolar cautery, unipolar cautery, laser, heater                            probe, stapler, plasma coagulator) Diagnosis Code(s):        --- Professional ---                           X68.180, Angiodysplasia of stomach and duodenum                             without bleeding                           K31.89, Other diseases of stomach and duodenum                           K92.1, Melena (includes Hematochezia) CPT copyright 2022 American [employer]. All rights reserved. The codes documented in this report are preliminary and upon coder review may  be revised to meet current compliance requirements. Elspeth P. Leigh, MD 03/22/2024  1:25:44 PM This report has been signed electronically. Number of Addenda: 0

## 2024-03-22 NOTE — Plan of Care (Signed)
  Problem: Education: Goal: Patient will understand all VAD equipment and how it functions Outcome: Progressing Goal: Patient will be able to verbalize current INR target range and antiplatelet therapy for discharge home Outcome: Progressing   Problem: Cardiac: Goal: LVAD will function as expected and patient will experience no clinical alarms Outcome: Progressing   Problem: Education: Goal: Knowledge of General Education information will improve Description: Including pain rating scale, medication(s)/side effects and non-pharmacologic comfort measures Outcome: Progressing   Problem: Health Behavior/Discharge Planning: Goal: Ability to manage health-related needs will improve Outcome: Progressing   Problem: Clinical Measurements: Goal: Ability to maintain clinical measurements within normal limits will improve Outcome: Progressing Goal: Will remain free from infection Outcome: Progressing Goal: Diagnostic test results will improve Outcome: Progressing Goal: Respiratory complications will improve Outcome: Progressing Goal: Cardiovascular complication will be avoided Outcome: Progressing   Problem: Activity: Goal: Risk for activity intolerance will decrease Outcome: Progressing   Problem: Nutrition: Goal: Adequate nutrition will be maintained Outcome: Progressing   Problem: Coping: Goal: Level of anxiety will decrease Outcome: Progressing   Problem: Pain Managment: Goal: General experience of comfort will improve and/or be controlled Outcome: Progressing   Problem: Safety: Goal: Ability to remain free from injury will improve Outcome: Progressing   Problem: Skin Integrity: Goal: Risk for impaired skin integrity will decrease Outcome: Progressing

## 2024-03-22 NOTE — Progress Notes (Signed)
 LVAD Coordinator Rounding Note:  Pt admitted 03/21/24 for suspected GIB. Pt states he started feeling poorly Sunday noting dizziness and generalized fatigue that has continued to worsen throughout the week. Yesterday morning his wife states he fell at home. Denies head trauma. 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 Pt was subtherapeutic 6/20 and bridged with Lovenox  till 6/24.   Hgb 6.4 on arrival pt received 2 PRBC. CT chest/abdomen/pelvis obtained overnight.GI consulted. Awaiting recommendations.   IMPRESSION: 1. No acute noncontrast CT abnormality of the chest, abdomen, or pelvis. No specific noncontrast CT findings to explain GI bleed. 2. Moderate burden of stool throughout the colon and rectum. 3. Emphysema and diffuse bilateral bronchial wall thickening. 4. Cardiomegaly and coronary artery disease. 5. Cholelithiasis. 6. Prostatomegaly.  Pt lying in bed this morning. Hgb 7.4 following 2 PRBC's last night. Plan for enteroscopy today.   Vital signs: Temp: 97.8 HR: 70 Doppler Pressure: not documented Auto Cuff BP: 85/67 (75) O2 Sat: 96% on RA Wt: 175.9 lbs    LVAD interrogation reveals:  Speed: 5300 Flow: 4.7 Power: 3.9 w PI: 2.9 Hematocrit: 37  Alarms: none Events: none  Fixed speed: 5300 Low speed limit: 5000  Drive Line:  Existing VAD dressing C/D/I with anchor intact and accurately applied. Weekly dressing changes per VAD coordinator or pts wife. Next dressing change due: 03/23/24 by caregiver.  Labs:  Hgb trend: 6.4>7.4  LDH trend: 124>102  INR trend: 2.2>2.3  Anticoagulation Plan: -INR Goal: 2.5-3.0  Device: - Cabin crew - V paced 60 -Therapies: OFF  Infection:   Plan/Recommendations:  Page VAD coordinator for drive line or equipment concerns 2. Weekly dressing changes per VAD Coordinator or caregiver.   Schuyler Lunger RN, BSN VAD Coordinator 24/7 Pager [phone]

## 2024-03-22 NOTE — Consult Note (Signed)
 Consultation  Referring Provider: Rolan Ezra RAMAN, MD Primary Care Physician:  Valora Agent, MD Primary Gastroenterologist:  unassigned  Reason for Consultation:  anemia, melena in setting of Coumadin  and Lovenox  in LVAD pt  HPI: Isaac [name] is a 73 y.o. male, with complicated history of severe congestive heart failure with EF less than 20%, status post LVAD [facility] 2022, coronary artery disease, atrial fibrillation, hypertension and recent admission May 2025 with acute left cerebellar CVA.  He also has ICD in place. Patient was seen by cardiology outpatient yesterday, had complaint of mild epigastric pain nausea and poor appetite over the past week and on further questioning reported that he had been having black stools over the past week.  He says about 1 bowel movement per day.  He has not had any vomiting or hematemesis.  Labs as an outpatient yesterday showed hemoglobin of 6.4. Interestingly patient had required overlap with Lovenox  from 6/20 through 03/14/2024 due to low INR in addition to continuing his Coumadin .  His wife thinks that that is when his situation changed and noticed more overt black stools. Patient does not have any prior history of GI bleeding, no prior EGD.  They report that he did have remote colonoscopy done on more than 1 occasion but the last one was probably at least 10 years ago done through [facility] . Last hemoglobin about 3 weeks ago was 11.9.  Today pro time 26.7/INR 2.3 Potassium 4.2 BUN 60/creatinine 2.25 Hemoglobin up to 7.4 post transfusions X2  Patient has not had any melena overnight and has been hemodynamically stable.  CT chest abdomen and pelvis done on admission without contrast-cardiomegaly/three-vessel coronary artery calcifications, no pericardial effusion moderate central lobar emphysema diffuse bilateral bronchial wall thickening, stomach within normal limits, no evidence of bowel wall thickening distention or inflammatory changes  moderate stool fat-containing inguinal hernia.       Past Medical History:  Diagnosis Date   Arrhythmia    atrial fibrillation   CHF (congestive heart failure) (HCC)    Chronic kidney disease    Coronary artery disease    Hyperlipidemia    Hypertension    Myocardial infarct [facility])     Past Surgical History:  Procedure Laterality Date   CARDIAC DEFIBRILLATOR PLACEMENT  feb 2014   ICD GENERATOR CHANGEOUT N/A 02/09/2024   Procedure: ICD GENERATOR CHANGEOUT;  Surgeon: Waddell Danelle ORN, MD;  Location: [facility] INVASIVE CV LAB;  Service: Cardiovascular;  Laterality: N/A;   INSERTION OF IMPLANTABLE LEFT VENTRICULAR ASSIST DEVICE N/A 09/17/2021   Procedure: INSERTION OF IMPLANTABLE LEFT VENTRICULAR ASSIST DEVICE AND INSERTION OF FEMORAL ARTERIAL LINE;  Surgeon: Lucas Dorise POUR, MD;  Location: [facility] OR;  Service: Open Heart Surgery;  Laterality: N/A;   IR FLUORO GUIDE CV LINE RIGHT  09/27/2021   IR THORACENTESIS ASP PLEURAL SPACE W/IMG GUIDE  09/27/2021   IR US  GUIDE VASC ACCESS RIGHT  09/27/2021   PLACEMENT OF IMPELLA LEFT VENTRICULAR ASSIST DEVICE N/A 09/12/2021   Procedure: PLACEMENT OF IMPELLA 5.5 LEFT VENTRICULAR ASSIST DEVICE;  Surgeon: Kerrin Elspeth BROCKS, MD;  Location: [facility] OR;  Service: Open Heart Surgery;  Laterality: N/A;   RIGHT HEART CATH N/A 09/08/2021   Procedure: RIGHT HEART CATH;  Surgeon: Rolan Ezra RAMAN, MD;  Location: [facility] INVASIVE CV LAB;  Service: Cardiovascular;  Laterality: N/A;   TEE WITHOUT CARDIOVERSION N/A 09/12/2021   Procedure: TRANSESOPHAGEAL ECHOCARDIOGRAM (TEE);  Surgeon: Kerrin Elspeth BROCKS, MD;  Location: [facility] OR;  Service: Open Heart Surgery;  Laterality: N/A;  TEE WITHOUT CARDIOVERSION N/A 09/17/2021   Procedure: TRANSESOPHAGEAL ECHOCARDIOGRAM (TEE);  Surgeon: Lucas Dorise POUR, MD;  Location: [facility] OR;  Service: Open Heart Surgery;  Laterality: N/A;    Prior to Admission medications   Medication Sig Start Date End Date Taking? Authorizing Provider  acetaminophen   (TYLENOL ) 325 MG tablet Take 2 tablets (650 mg total) by mouth every 4 (four) hours as needed for headache or mild pain (pain score 1-3). 02/18/24  Yes Angiulli, Toribio PARAS, PA-C  furosemide  (LASIX ) 20 MG tablet Take 2 tablets (40 mg total) by mouth daily. 02/18/24  Yes Angiulli, Toribio PARAS, PA-C  magnesium  oxide (MAG-OX) 400 (240 Mg) MG tablet Take 1 tablet (400 mg total) by mouth daily. 02/18/24  Yes Angiulli, Toribio PARAS, PA-C  Multiple Vitamins-Minerals (ONE A DAY MEN 50 PLUS PO) Take 1 tablet by mouth daily.   Yes [provider]  pantoprazole  (PROTONIX ) 40 MG tablet Take 1 tablet (40 mg total) by mouth daily. 02/18/24  Yes Angiulli, Toribio PARAS, PA-C  potassium chloride  SA (KLOR-CON  M) 10 MEQ tablet Take 2 tablets (20 mEq total) by mouth daily. 02/28/24  Yes Rolan Ezra RAMAN, MD  rosuvastatin  (CRESTOR ) 10 MG tablet Take 1 tablet (10 mg total) by mouth at bedtime. 02/18/24  Yes Angiulli, Toribio PARAS, PA-C  sildenafil  (REVATIO ) 20 MG tablet Take 1 tablet (20 mg total) by mouth 3 (three) times daily. 02/18/24  Yes Angiulli, Toribio PARAS, PA-C  warfarin (COUMADIN ) 2 MG tablet Take 2 mg by mouth every Tuesday, Thursday, and Saturday and 4 mg all other days 03/10/24  Yes Rolan Ezra RAMAN, MD    Current Facility-Administered Medications  Medication Dose Route Frequency Provider Last Rate Last Admin   acetaminophen  (TYLENOL ) tablet 650 mg  650 mg Oral Q4H PRN Lee, Jordan, NP       Chlorhexidine  Gluconate Cloth 2 % PADS 6 each  6 each Topical Daily Rolan Ezra RAMAN, MD   6 each at 03/21/24 1630   ondansetron  (ZOFRAN ) injection 4 mg  4 mg Intravenous Q6H PRN Lee, [place], NP       pantoprazole  (PROTONIX ) EC tablet 40 mg  40 mg Oral BID Lee, [place], NP   40 mg at 03/21/24 2127    Allergies as of 03/21/2024 - Review Complete 03/21/2024  Allergen Reaction Noted   Neosporin [neomycin-bacitracin zn-polymyx] Hives 01/02/2016   Doxycycline  Nausea And Vomiting 11/27/2022   Mushroom extract complex (obsolete) Nausea And  Vomiting 12/09/2015   Tape Other (See Comments) 11/27/2022    Family History  Problem Relation Age of Onset   Hypertension Mother    Hypertension Father    Hyperlipidemia Father    Heart attack Father    Heart failure Father     Social History   Socioeconomic History   Marital status: Married    Spouse name: Not on file   Number of children: Not on file   Years of education: Not on file   Highest education level: Not on file  Occupational History   Not on file  Tobacco Use   Smoking status: Former   Smokeless tobacco: Never  Vaping Use   Vaping status: Never Used  Substance and Sexual Activity   Alcohol use: No   Drug use: No   Sexual activity: Not on file  Other Topics Concern   Not on file  Social History Narrative   Not on file   Social Drivers of Health   Financial Resource Strain: Not on file  Food Insecurity: No  Food Insecurity (03/21/2024)   Hunger Vital Sign    Worried About Running Out of Food in the Last Year: Never true    Ran Out of Food in the Last Year: Never true  Transportation Needs: No Transportation Needs (03/21/2024)   PRAPARE - Administrator, Civil Service (Medical): No    Lack of Transportation (Non-Medical): No  Physical Activity: Not on file  Stress: Not on file  Social Connections: Moderately Integrated (03/21/2024)   Social Connection and Isolation Panel    Frequency of Communication with Friends and Family: More than three times a week    Frequency of Social Gatherings with Friends and Family: More than three times a week    Attends Religious Services: Never    Database administrator or Organizations: Yes    Attends Engineer, structural: 1 to 4 times per year    Marital Status: Married  Recent Concern: Social Connections - Moderately Isolated (02/05/2024)   Social Connection and Isolation Panel    Frequency of Communication with Friends and Family: Never    Frequency of Social Gatherings with Friends and Family:  Never    Attends Religious Services: Never    Database administrator or Organizations: Yes    Attends Banker Meetings: 1 to 4 times per year    Marital Status: Married  Catering manager Violence: Not At Risk (03/21/2024)   Humiliation, Afraid, Rape, and Kick questionnaire    Fear of Current or Ex-Partner: No    Emotionally Abused: No    Physically Abused: No    Sexually Abused: No    Review of Systems:  Pertinent positive and negative review of systems were noted in the above HPI section.  All other review of systems was otherwise negative.   Physical Exam: Vital signs in last 24 hours: Temp:  [97.8 F (36.6 C)-98.6 F (37 C)] 97.8 F (36.6 C) (07/02 0700) Pulse Rate:  [60-160] 70 (07/02 0600) Resp:  [12-22] 15 (07/02 0600) BP: (77-98)/(0-78) 82/65 (07/02 0600) SpO2:  [93 %-100 %] 98 % (07/02 0600) Weight:  [79.8 kg-80.7 kg] 79.8 kg (07/02 0500) Last BM Date :  (PTA) General:   Alert,  Well-developed, elderly African-American male , pleasant and cooperative in NAD wife at bedside Head:  Normocephalic and atraumatic. Eyes:  Sclera clear, no icterus.   Conjunctiva pale. Ears:  Normal auditory acuity. Nose:  No deformity, discharge,  or lesions. Mouth:  No deformity or lesions.   Neck:  Supple; no masses or thyromegaly. Lungs:  Clear throughout to auscultation.   No wheezes, crackles, or rhonchi.  Heart: LVAD hum Abdomen:  Soft,nontender, BS active,nonpalp mass or hsm.   Rectal: Not done, stool documented heme positive Msk:  Symmetrical without gross deformities. . Pulses:  Normal pulses noted. Extremities:  Without clubbing or edema. Neurologic:  Alert and  oriented x4;  grossly normal neurologically. Skin:  Intact without significant lesions or rashes.. Psych:  Alert and cooperative. Normal mood and affect.  Intake/Output from previous day: 07/01 0701 - 07/02 0700 In: 555 [I.V.:35; Blood:520] Out: 1350 [Urine:1350] Intake/Output this shift: No  intake/output data recorded.  Lab Results: Recent Labs    03/21/24 1500 03/22/24 0228  WBC 6.5 5.7  HGB 6.4* 7.4*  HCT 20.3* 22.8*  PLT 241 190   BMET Recent Labs    03/21/24 1500 03/22/24 0228  NA 133* 137  K 3.8 4.2  CL 102 108  CO2 20* 21*  GLUCOSE 99  89  BUN 63* 60*  CREATININE 2.33* 2.25*  CALCIUM  9.3 8.4*   LFT Recent Labs    03/21/24 1500  PROT 6.6  ALBUMIN  3.5  AST 23  ALT 16  ALKPHOS 62  BILITOT 0.8  BILIDIR 0.2  IBILI 0.6   PT/INR Recent Labs    03/21/24 1500 03/22/24 0228  LABPROT 25.9* 26.7*  INR 2.2* 2.3*   Hepatitis Panel No results for input(s): HEPBSAG, HCVAB, HEPAIGM, HEPBIGM in the last 72 hours.   IMPRESSION:  #65 73 year old male status post LVAD 11/2020 for severe systolic congestive heart failure/cardiomyopathy with EF less 20 to 25%, also with atrial fibrillation, and coronary artery disease on chronic Coumadin . #2 hospitalization May 2025 with acute CVA-cardioembolic, left cerebellar infarct with left PCA occlusion-goal INR 2.5-3 Patient required Lovenox  overlap for about 4 days last week.  #3 kidney disease stage IV-off dialysis currently #4 tricuspid regurgitation  #5 acute/subacute GI bleed with drop in hemoglobin from 11.9 on 02/28/2024-6.4 yesterday.  Patient reporting black stool over the past week No prior history of GI bleeding, remote colonoscopy done through [facility]  Patient has had some epigastric discomfort and nausea over the past week.  Rule out gastropathy, peptic ulcer disease, occult neoplasm, AVMs  Plan; keep patient n.p.o. this a.m. Long discussion with patient and wife regarding indication for endoscopic evaluation with EGD/enteroscopy.  We reviewed indications risks and benefits in detail. After further discussion with cardiology this morning, they are agreeable to proceed. Patient will be scheduled for procedure today with Dr. Leigh. Patient is to be transfused 1 more unit of packed RBCs this  a.m. Continue IV PPI twice daily GI will follow with you     Irais Mottram EsterwoodPA-C  03/22/2024, 8:54 AM

## 2024-03-22 NOTE — Anesthesia Preprocedure Evaluation (Addendum)
 Anesthesia Evaluation  Patient identified by MRN, date of birth, ID band Patient awake    Reviewed: Allergy & Precautions, H&P , NPO status , Patient's Chart, lab work & pertinent test results  Airway Mallampati: II  TM Distance: >3 FB Neck ROM: Full    Dental no notable dental hx. (+) Edentulous Lower, Dental Advisory Given   Pulmonary former smoker   Pulmonary exam normal breath sounds clear to auscultation       Cardiovascular hypertension, + CAD and +CHF  + Cardiac Defibrillator  Rhythm:Regular Rate:Normal     Neuro/Psych CVA  negative psych ROS   GI/Hepatic negative GI ROS, Neg liver ROS,,,  Endo/Other  negative endocrine ROS    Renal/GU Renal InsufficiencyRenal disease  negative genitourinary   Musculoskeletal   Abdominal   Peds  Hematology  (+) Blood dyscrasia, anemia   Anesthesia Other Findings   Reproductive/Obstetrics negative OB ROS                              Anesthesia Physical Anesthesia Plan  ASA: 4  Anesthesia Plan: MAC   Post-op Pain Management: Minimal or no pain anticipated   Induction: Intravenous  PONV Risk Score and Plan: 1 and Propofol  infusion  Airway Management Planned: Natural Airway and Simple Face Mask  Additional Equipment:   Intra-op Plan:   Post-operative Plan:   Informed Consent: I have reviewed the patients History and Physical, chart, labs and discussed the procedure including the risks, benefits and alternatives for the proposed anesthesia with the patient or authorized representative who has indicated his/her understanding and acceptance.     Dental advisory given  Plan Discussed with: CRNA  Anesthesia Plan Comments:          Anesthesia Quick Evaluation

## 2024-03-22 NOTE — Transfer of Care (Signed)
 Immediate Anesthesia Transfer of Care Note  Patient: Isaac [name]  Procedure(s) Performed: ENTEROSCOPY  Patient Location: PACU and Endoscopy Unit  Anesthesia Type:MAC  Level of Consciousness: awake, alert , and oriented  Airway & Oxygen Therapy: Patient Spontanous Breathing  Post-op Assessment: Report given to RN and Post -op Vital signs reviewed and stable  Post vital signs: Reviewed and stable  Last Vitals:  Vitals Value Taken Time  BP 94/72 03/22/24 13:20  Temp    Pulse 60 03/22/24 13:21  Resp 17 03/22/24 13:21  SpO2 99 % 03/22/24 13:21  Vitals shown include unfiled device data.  Last Pain:  Vitals:   03/22/24 1215  TempSrc: Temporal  PainSc: 0-No pain         Complications: No notable events documented.

## 2024-03-22 NOTE — Progress Notes (Signed)
 VAD Coordinator Procedure Note:   VAD Coordinator met patient in 2H15 and transported to the endoscopy suite. Pt undergoing enteroscopy per Dr. Leigh. Hemodynamics and VAD parameters monitored by myself and anesthesia throughout the procedure. Blood pressures were obtained with automatic cuff on left arm.    Time: Doppler Auto  BP Flow PI Power Speed  Pre-procedure:  1210  107/73(86) 4.0 3.4 3.9 5300   1237  103/84(91) 3.9 4.1 3.8 5300           Sedation Induction: 1242  106/77(87) 4.0 3.8 3.8 5300   1250  90/69(77) 3.8 3.8 3.7 5300   1300  89/78(83) 4.0 3.4 3.8 5300           Recovery Area: 1310  85/66(74) 4.2 3.3 3.8 5300   1321  94/72(80) 4.1 3.2 3.9 5300   1335  88/74(80) 4.3 3.4 3.8 5300   Patient tolerated the procedure well. VAD Coordinator accompanied and remained with patient in recovery area.   Patient Disposition: 2H15 report given to [facility]

## 2024-03-22 NOTE — Anesthesia Postprocedure Evaluation (Addendum)
 Anesthesia Post Note  Patient: Isaac [name]  Procedure(s) Performed: ENTEROSCOPY     Patient location during evaluation: PACU Anesthesia Type: MAC Level of consciousness: awake and alert Pain management: pain level controlled Vital Signs Assessment: post-procedure vital signs reviewed and stable Respiratory status: spontaneous breathing, nonlabored ventilation and respiratory function stable Cardiovascular status: stable and blood pressure returned to baseline Anesthetic complications: no   No notable events documented.  Last Vitals:  Vitals:   03/22/24 1340 03/22/24 1354  BP: 93/79 97/75  Pulse: 60   Resp: (!) 21 17  Temp:    SpO2: 100%     Last Pain:  Vitals:   03/22/24 1335  TempSrc:   PainSc: 0-No pain                 [name] Like

## 2024-03-23 ENCOUNTER — Other Ambulatory Visit: Payer: Self-pay

## 2024-03-23 ENCOUNTER — Ambulatory Visit: Admitting: Speech Pathology

## 2024-03-23 ENCOUNTER — Ambulatory Visit

## 2024-03-23 DIAGNOSIS — I5043 Acute on chronic combined systolic (congestive) and diastolic (congestive) heart failure: Secondary | ICD-10-CM

## 2024-03-23 DIAGNOSIS — Z7901 Long term (current) use of anticoagulants: Secondary | ICD-10-CM | POA: Diagnosis not present

## 2024-03-23 DIAGNOSIS — K31819 Angiodysplasia of stomach and duodenum without bleeding: Secondary | ICD-10-CM

## 2024-03-23 DIAGNOSIS — K5520 Angiodysplasia of colon without hemorrhage: Secondary | ICD-10-CM | POA: Diagnosis not present

## 2024-03-23 DIAGNOSIS — K3189 Other diseases of stomach and duodenum: Secondary | ICD-10-CM | POA: Diagnosis not present

## 2024-03-23 DIAGNOSIS — K922 Gastrointestinal hemorrhage, unspecified: Secondary | ICD-10-CM | POA: Diagnosis not present

## 2024-03-23 DIAGNOSIS — Z95811 Presence of heart assist device: Secondary | ICD-10-CM | POA: Diagnosis not present

## 2024-03-23 LAB — CBC
HCT: 24.4 % — ABNORMAL LOW (ref 39.0–52.0)
HCT: 27.6 % — ABNORMAL LOW (ref 39.0–52.0)
Hemoglobin: 7.9 g/dL — ABNORMAL LOW (ref 13.0–17.0)
Hemoglobin: 9.1 g/dL — ABNORMAL LOW (ref 13.0–17.0)
MCH: 29.7 pg (ref 26.0–34.0)
MCH: 30.3 pg (ref 26.0–34.0)
MCHC: 32.4 g/dL (ref 30.0–36.0)
MCHC: 33 g/dL (ref 30.0–36.0)
MCV: 91.7 fL (ref 80.0–100.0)
MCV: 92 fL (ref 80.0–100.0)
Platelets: 177 10*3/uL (ref 150–400)
Platelets: 183 10*3/uL (ref 150–400)
RBC: 2.66 MIL/uL — ABNORMAL LOW (ref 4.22–5.81)
RBC: 3 MIL/uL — ABNORMAL LOW (ref 4.22–5.81)
RDW: 17.5 % — ABNORMAL HIGH (ref 11.5–15.5)
RDW: 17 % — ABNORMAL HIGH (ref 11.5–15.5)
WBC: 5.3 10*3/uL (ref 4.0–10.5)
WBC: 5.6 10*3/uL (ref 4.0–10.5)
nRBC: 0 % (ref 0.0–0.2)
nRBC: 0 % (ref 0.0–0.2)

## 2024-03-23 LAB — BASIC METABOLIC PANEL WITH GFR
Anion gap: 6 (ref 5–15)
BUN: 54 mg/dL — ABNORMAL HIGH (ref 8–23)
CO2: 20 mmol/L — ABNORMAL LOW (ref 22–32)
Calcium: 8.2 mg/dL — ABNORMAL LOW (ref 8.9–10.3)
Chloride: 109 mmol/L (ref 98–111)
Creatinine, Ser: 2.16 mg/dL — ABNORMAL HIGH (ref 0.61–1.24)
GFR, Estimated: 32 mL/min — ABNORMAL LOW (ref >60)
Glucose, Bld: 90 mg/dL (ref 70–99)
Potassium: 3.9 mmol/L (ref 3.5–5.1)
Sodium: 135 mmol/L (ref 135–145)

## 2024-03-23 LAB — PREPARE RBC (CROSSMATCH)

## 2024-03-23 LAB — PROTIME-INR
INR: 2.2 — ABNORMAL HIGH (ref 0.8–1.2)
Prothrombin Time: 25.5 s — ABNORMAL HIGH (ref 11.4–15.2)

## 2024-03-23 LAB — MAGNESIUM: Magnesium: 2.1 mg/dL (ref 1.7–2.4)

## 2024-03-23 LAB — LACTATE DEHYDROGENASE: LDH: 107 U/L (ref 98–192)

## 2024-03-23 MED ORDER — ADULT MULTIVITAMIN W/MINERALS CH
1.0000 | ORAL_TABLET | Freq: Every day | ORAL | Status: DC
  Administered 2024-03-23 – 2024-04-02 (×11): 1 via ORAL
  Filled 2024-03-23 (×11): qty 1

## 2024-03-23 MED ORDER — ROSUVASTATIN CALCIUM 5 MG PO TABS
10.0000 mg | ORAL_TABLET | Freq: Every day | ORAL | Status: DC
  Administered 2024-03-23 – 2024-04-01 (×9): 10 mg via ORAL
  Filled 2024-03-23 (×10): qty 2

## 2024-03-23 MED ORDER — WARFARIN - PHARMACIST DOSING INPATIENT: Freq: Every day | Status: DC

## 2024-03-23 MED ORDER — OCTREOTIDE ACETATE 100 MCG/ML IJ SOLN
100.0000 ug | Freq: Two times a day (BID) | INTRAMUSCULAR | Status: DC
  Administered 2024-03-23: 100 ug via SUBCUTANEOUS
  Filled 2024-03-23 (×2): qty 1

## 2024-03-23 MED ORDER — SILDENAFIL CITRATE 20 MG PO TABS
20.0000 mg | ORAL_TABLET | Freq: Three times a day (TID) | ORAL | Status: DC
  Administered 2024-03-23 – 2024-04-02 (×30): 20 mg via ORAL
  Filled 2024-03-23 (×31): qty 1

## 2024-03-23 MED ORDER — ONE A DAY MEN 50 PLUS PO TABS: 1.0000 | ORAL_TABLET | Freq: Every day | ORAL | Status: DC

## 2024-03-23 MED ORDER — FUROSEMIDE 40 MG PO TABS
40.0000 mg | ORAL_TABLET | Freq: Every day | ORAL | Status: DC
  Administered 2024-03-23: 40 mg via ORAL
  Filled 2024-03-23: qty 1

## 2024-03-23 MED ORDER — PANTOPRAZOLE SODIUM 40 MG PO TBEC
40.0000 mg | DELAYED_RELEASE_TABLET | Freq: Two times a day (BID) | ORAL | Status: DC
  Administered 2024-03-23 – 2024-04-01 (×18): 40 mg via ORAL
  Filled 2024-03-23 (×21): qty 1

## 2024-03-23 MED ORDER — SODIUM CHLORIDE 0.9% IV SOLUTION: Freq: Once | INTRAVENOUS | Status: AC

## 2024-03-23 MED ORDER — LACTULOSE 10 GM/15ML PO SOLN
20.0000 g | Freq: Three times a day (TID) | ORAL | Status: DC
  Administered 2024-03-23: 20 g via ORAL
  Filled 2024-03-23 (×2): qty 30

## 2024-03-23 MED ORDER — WARFARIN SODIUM 2 MG PO TABS
4.0000 mg | ORAL_TABLET | Freq: Once | ORAL | Status: AC
  Administered 2024-03-23: 4 mg via ORAL
  Filled 2024-03-23: qty 2

## 2024-03-23 NOTE — Progress Notes (Signed)
 Patient ID: Isaac [name], male   DOB: August 15, 1951, 73 y.o.   MRN: [id number]    Progress Note   Subjective   Day # 2 CC; anemia, melena in setting of Coumadin  and Lovenox  and LVAD patient  IV PPI twice daily Starting octreotide  Enteroscopy yesterday-finding of single AVM nonbleeding gastric antrum treated with APC, 2 AVMs nonbleeding in the second portion of the duodenum seen and treated with APC, patchy duodenitis and a single AVM in the proximal jejunum also treated with APC  Labs today-WBC 5.3/hemoglobin 7.9/hematocrit 24.4-post 2 units of blood stable Pro time 25.5/INR 2.2 Potassium 3.9/BUN 54/creatinine 2.16  Patient in better spirits today, no complaints of abdominal discomfort or nausea, has been advanced to solid diet this morning No melena or bowel movement today To receive 1 more unit of packed RBCs this a.m.   Objective   Vital signs in last 24 hours: Temp:  [97.3 F (36.3 C)-98.6 F (37 C)] 97.5 F (36.4 C) (07/03 0700) Pulse Rate:  [57-118] 97 (07/03 0800) Resp:  [0-26] 12 (07/03 0800) BP: (64-113)/(46-84) 86/69 (07/03 0800) SpO2:  [90 %-100 %] 99 % (07/03 0800) Weight:  [79.4 kg] 79.4 kg (07/03 0500) Last BM Date :  (PTA) General:   Elderly African-American male in NAD Heart: LVAD hum Lungs: Respirations even and unlabored, lungs CTA bilaterally Abdomen:  Soft, nontender and nondistended. Normal bowel sounds. Extremities:  Without edema. Neurologic:  Alert and oriented,  grossly normal neurologically. Psych:  Cooperative. Normal mood and affect.  Intake/Output from previous day: 07/02 0701 - 07/03 0700 In: 723.3 [I.V.:310; Blood:413.3] Out: 975 [Urine:975] Intake/Output this shift: Total I/O In: 240 [P.O.:240] Out: 300 [Urine:300]  Lab Results: Recent Labs    03/21/24 1500 03/22/24 0228 03/22/24 1437 03/23/24 0431  WBC 6.5 5.7  --  5.3  HGB 6.4* 7.4* 8.1* 7.9*  HCT 20.3* 22.8* 24.8* 24.4*  PLT 241 190  --  177   BMET Recent Labs     03/22/24 0228 03/22/24 1437 03/23/24 0431  NA 137 136 135  K 4.2 4.1 3.9  CL 108 109 109  CO2 21* 22 20*  GLUCOSE 89 112* 90  BUN 60* 60* 54*  CREATININE 2.25* 2.48* 2.16*  CALCIUM  8.4* 8.3* 8.2*   LFT Recent Labs    03/21/24 1500  PROT 6.6  ALBUMIN  3.5  AST 23  ALT 16  ALKPHOS 62  BILITOT 0.8  BILIDIR 0.2  IBILI 0.6   PT/INR Recent Labs    03/22/24 0228 03/23/24 0431  LABPROT 26.7* 25.5*  INR 2.3* 2.2*    Studies/Results: CT CHEST ABDOMEN PELVIS WO CONTRAST Result Date: 03/21/2024 CLINICAL DATA:  Concern for GI bleed * Tracking Code: BO * EXAM: CT CHEST, ABDOMEN AND PELVIS WITHOUT CONTRAST TECHNIQUE: Multidetector CT imaging of the chest, abdomen and pelvis was performed following the standard protocol without IV contrast. RADIATION DOSE REDUCTION: This exam was performed according to the departmental dose-optimization program which includes automated exposure control, adjustment of the mA and/or kV according to patient size and/or use of iterative reconstruction technique. COMPARISON:  09/09/2021 FINDINGS: CT CHEST FINDINGS Cardiovascular: Aortic atherosclerosis. Cardiomegaly. Three-vessel coronary artery calcifications. LVAD. No pericardial effusion. Mediastinum/Nodes: No enlarged mediastinal, hilar, or axillary lymph nodes. Thyroid  gland, trachea, and esophagus demonstrate no significant findings. Lungs/Pleura: Moderate centrilobular emphysema. Diffuse bilateral bronchial wall thickening. No pleural effusion or pneumothorax. Musculoskeletal: No chest wall abnormality. No acute osseous findings. CT ABDOMEN PELVIS FINDINGS Hepatobiliary: No solid liver abnormality is seen. Gallstones. No  gallbladder wall thickening, or biliary dilatation. Pancreas: Unremarkable. No pancreatic ductal dilatation or surrounding inflammatory changes. Spleen: Normal in size without significant abnormality. Adrenals/Urinary Tract: Unchanged, definitively benign small bilateral adrenal adenomata, for  which no further follow-up or characterization is required. Kidneys are normal, without renal calculi, solid lesion, or hydronephrosis. Bladder is unremarkable. Stomach/Bowel: Stomach is within normal limits. Appendix appears normal. No evidence of bowel wall thickening, distention, or inflammatory changes. Moderate burden of stool throughout the colon and rectum. Vascular/Lymphatic: Aortic atherosclerosis. No enlarged abdominal or pelvic lymph nodes. Reproductive: Prostatomegaly. Other: Fat containing left inguinal hernia.  No ascites. Musculoskeletal: No acute osseous findings. IMPRESSION: 1. No acute noncontrast CT abnormality of the chest, abdomen, or pelvis. No specific noncontrast CT findings to explain GI bleed. 2. Moderate burden of stool throughout the colon and rectum. 3. Emphysema and diffuse bilateral bronchial wall thickening. 4. Cardiomegaly and coronary artery disease. 5. Cholelithiasis. 6. Prostatomegaly. Aortic Atherosclerosis (ICD10-I70.0) and Emphysema (ICD10-J43.9). Electronically Signed   By: Marolyn JONETTA Jaksch M.D.   On: 03/21/2024 21:51   DG CHEST PORT 1 VIEW Result Date: 03/21/2024 CLINICAL DATA:  Heart failure.  LVAD. EXAM: PORTABLE CHEST 1 VIEW COMPARISON:  Chest x-ray 02/05/2024 FINDINGS: Sternal wires again identified. Left upper chest battery pack with defibrillator leads along the heart. Is also a left ventricular assist device overlying the apex of heart on the left side. Heart itself is enlarged with some vascular congestion. No pneumothorax or effusion. No consolidation. Overlapping cardiac leads. Surgical changes along the right upper chest. IMPRESSION: Postop chest with enlarged heart and vascular congestion. Defibrillator and left ventricular assist device. Electronically Signed   By: Ranell Bring M.D.   On: 03/21/2024 17:21       Assessment / Plan:    #60 73 year old male with advanced heart failure status post LVAD 11/2020, history of coronary artery disease, atrial  fibrillation, very recent CVA May 2025 now with goal of INR 2.5-3, also had to take Lovenox  for about 5 days last week due to low INR Admitted now with dark stools over the past week and drop in hemoglobin to 6.4  Enteroscopy yesterday with finding of several AVMs as outlined above none of which were actively bleeding and all treated with APC, patchy duodenitis.  Hemoglobin has been relatively stable but getting 1 more unit today to keep his hemoglobin above 8  Cardiology is starting octreotide will be helpful long-term  #2 chronic kidney disease stage IV- #3 tricuspid regurgitation #4.  Acute on chronic secondary to above  Plan; okay for solid diet today Can discontinue IV PPI Changed to oral PPI once daily Monthly long-acting octreotide as per cardiology Continue to trend hemoglobin. GI will sign off, but available if needed     Principal Problem:   GI bleed Active Problems:   Melena   Acute post-hemorrhagic anemia   Anticoagulated   AVM (arteriovenous malformation) of small bowel, acquired with hemorrhage     LOS: 2 days   Lianny Molter EsterwoodPA-C  03/23/2024, 8:34 AM

## 2024-03-23 NOTE — Progress Notes (Addendum)
 LVAD Coordinator Rounding Note:  Pt admitted 03/21/24 for suspected GIB. Pt states he started feeling poorly Sunday noting dizziness and generalized fatigue that has continued to worsen throughout the week. Yesterday morning his wife states he fell at home. Denies head trauma. 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 Pt was subtherapeutic 6/20 and bridged with Lovenox  till 6/24.   Hgb 6.4 on arrival pt received 2 PRBC. CT chest/abdomen/pelvis obtained overnight.GI consulted. Awaiting recommendations.   IMPRESSION: 1. No acute noncontrast CT abnormality of the chest, abdomen, or pelvis. No specific noncontrast CT findings to explain GI bleed. 2. Moderate burden of stool throughout the colon and rectum. 3. Emphysema and diffuse bilateral bronchial wall thickening. 4. Cardiomegaly and coronary artery disease. 5. Cholelithiasis. 6. Prostatomegaly.  Pt lying in bed this morning. Hgb 8.1. Enteroscopy performed yesterday. 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 GI recommends if bleeding continues pt may need a colonoscopy. SQ Octreotide ordered today. Will set outpatient injections upon discharge.  Vital signs: Temp: 97.5 HR: 60 Doppler Pressure: 82 Auto Cuff BP: 86/69 (77) O2 Sat: 99% on RA Wt: 175.9>175.1 lbs    LVAD interrogation reveals:  Speed: 5300 Flow: 4.2 Power: 4.0 w PI: 3.5 Hematocrit: 37  Alarms: none Events: rare  Fixed speed: 5300 Low speed limit: 5000  Pt has hospital loaner back up back on VAD cart.   Drive Line:  Existing VAD dressing C/D/I with anchor intact and accurately applied. Weekly dressing changes per VAD coordinator or pts wife. Next dressing change due: 03/22/24 by caregiver. Weekly kit on VAD cart.   Labs:  Hgb trend: 6.4>7.4>8.1  LDH trend: 124>102>107  INR trend: 2.2>2.3>2.2  Anticoagulation Plan: -INR Goal:  2.5-3.0  Blood Products: 03/21/24: 2 PRBC 03/22/24: 1 PRBC 03/23/24: 1 PRBC  Device: - Cabin crew - V paced 60 -Therapies: OFF  Infection:   Plan/Recommendations:  Page VAD coordinator for drive line or equipment concerns 2. Weekly dressing changes per VAD Coordinator or caregiver.   Schuyler Lunger RN, BSN VAD Coordinator 24/7 Pager [phone]

## 2024-03-23 NOTE — Discharge Summary (Incomplete)
 Advanced Heart Failure Team  Discharge Summary   Patient ID: Isaac [name] MRN: [id number], DOB/AGE: 1951/05/27 73 y.o. Admit date: 03/21/2024 D/C date:     04/02/2024   Primary Discharge Diagnoses:  GIB Chronic HFrEF, HMIII LVAD Tricuspid Regurgitation PAF CKD Stage IV  Secondary Discharge Diagnoses:  CAD Abnormal Thyroid  Function Test Nausea  CVA   Hospital Course:   73 y.o. with history of CAD, chronic systolic CHF s/p LVAD HM3, paroxysmal atrial fibrillation on Coumadin , tricuspid regurgitation, hypertension, hyperlipidemia, and CKD stage IV.   He was admitted on 03/21/2024 from the office for GI bleed with hemoglobin down to 6.4. Coumadin  was held. He was placed on PPI. GI was consulted and he was transfused multiple PRBCs (total of 7 units this admission). He underwent EGD on 7/2 with friable tissue in the duodenum and 4 AVMS which were treated with APC. INR goal was adjusted to 2-2.5. Unfortunately, he continued to have melena and his hemoglobin continued to drop. He underwent repeat EGD + colonoscopy on 7/9  which showed 1 non-bleeding superficial gastric ulcer and 1 clip was placed. Colonoscopy unremarkable. No further bleeding or drop in hemoglobin. INR 1.8 on day of discharge.  From a CHF perspective, volume status was stable. Diuretics held on admit but later restarted. Placed back on PO diuretics and Sildenafil .   He will continue to be followed closely in the VAD clinic. INR will be followed and adjusted by VAD Pharmacy Team.   LVAD Interrogation HM III on day of discharge: Speed: 5300 Flow: 4.6 PI: 3.4 Power: 4.0. VAD interrogated personally by Dr. Genoa Freyre. Parameters stable.   1. GI Bleed: Patient presented with epigastric pain, nausea, and melena x1 week after getting Lovenox  for several days. Also reported lightheadedness.  INR goal had been increased to 2.5-3 after recent CVA in 01/2024. Hemoglobin 6.4 on admission. He underwent multiple transfusions of PRBCs (a  total of 7 throughout admission). EGD on 7/2 with very friable tissue in the duodenum and 4 AVMs that were treated with APC. Repeat EGD/Colonoscopy on 7/9 (due to continued drops in H&H) showed 1 non bleeding superficial gastric ulcer and 1 clip placed. Colonoscopy unremarkable.  - Hemoglobin stable at 8.8 on day of discharge with no ongoing bleeding. - Continue Protonix  40mg  twice daily for 4 weeks and then can go back to once daily per GI. - He was treated with subcutaneous Octreotide  twice daily during admission. Monthly Octreotide  as an outpatient was recommended, but patient and wife wanted to think about this more.  - INR goal lowered to 2.0 - 2.5. INR 1.8 on day of discharge. Resume Coumadin  as below. - Follow-up with GI as an outpatient.   2. Chronic Systolic CHF s/p LVAD and ICD: Long-standing cardiomyopathy.  S/p [employer] dual chamber ICD with recent generator change out in 01/2024 and LVAD in 08/2021. Most recent Echo in 01/2024 showed EF 20-25%, mild LVH, LVAD present, mild RV dysfunction, moderate AI, mild RV dysfunction with normal RV size, severe TR.  Stable NYHA class II, MAP 70s and LVAD parameters stable today.  - Volume ok. Home Lasix  was held during admission in setting of acute GI bleed. Will resume home Lasix  40mg  daily on discharge. - Continue Sildenafil  20 mg three times daily. - Coumadin  held on admission due to acute GI bleed. INR goal 2.0 to 2.5. INR 1.8 on day of discharge. IV Heparin  stopped. Personally reviewed dosing of Coumadin  with Pharmacy team. Will give 2mg  prior to discharge and then resume  home regimen tomorrow (2mg  Tu/ Th/ Sat and then 4mg  all other days). - He already has close follow-up scheduled for later this week.   3. Tricuspid Regurgitation: Tricuspid repair not done at time of VAD due to proximity of ICD wires and hypotension during surgery. He has severe TR.    4. Paroxysmal Atrial fibrillation: He is off Amiodarone  with resolution of nausea.  Currently a-paced.  - Remains in NSR. - Continue chronic anticoagulation with Coumadin  as above.   5. CKD stage IV:  Suspect intra-op hypotension at the time of LVAD placement led to development of ATN >> urine sediment looked like ATN per renal. Had initial CVVH and transitioned over to HD. He is now off HD.  Creatinine stable 1.90    6. CAD:  History of PCI to OM1 in 2007 and RCA in 2013.   - No CP or ACS.  - No Aspirin  given need for anticoagulation as above.  - Continue Crestor .     7. History of Abnormal Thyroid  Function Tests:  Patient has had elevated TSH and free T4, mixed pattern. 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 Amiodarone  was stopped in 05/2023. TSH normal this admission.  - He follows with Endocrinology.     8. CVA:  Cardioembolic stroke in 01/2024 in setting atrial fibrillation and LVAD.  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  INR goal increased to 2.5 - 3.0 at that time. Now decreased back to 2.0 - 2.5 in setting of GI    9. Right Knee Pain:  - Uric acid 7.5.  - Felt to be pre-patellar bursitis >> improved  Patient was personally seen and examined by Dr. Cherrie today and felt to be stable for discharge. Close outpatient follow-up will be arranged. Medications as below.  Discharge Weight: 174.6 lbs Discharge Vitals: Blood pressure 100/75, pulse 60, temperature 98.6 F (37 C), temperature source Oral, resp. rate 16, height 5' 9 (1.753 m), weight 79.2 kg, SpO2 99%.  Labs: Lab Results  Component Value Date   WBC 4.8 04/02/2024   HGB 8.8 (L) 04/02/2024   HCT 27.5 (L) 04/02/2024   MCV 90.2 04/02/2024   PLT 231 04/02/2024    Recent Labs  Lab 04/02/24 0228  NA 137  K 4.2  CL 110  CO2 22  BUN 20  CREATININE 1.90*  CALCIUM  8.4*  GLUCOSE 88   Lab Results  Component Value Date   CHOL 118 02/06/2024   HDL 39 (L) 02/06/2024   LDLCALC 73 02/06/2024   TRIG 32 02/06/2024   BNP (last 3 results) Recent Labs    02/05/24 1106   BNP 343.8*    ProBNP (last 3 results) No results for input(s): PROBNP in the last 8760 hours.   Diagnostic Studies/Procedures   No results found.   Discharge Medications   Allergies as of 04/02/2024       Reactions   Neosporin [neomycin-bacitracin Zn-polymyx] Hives   Doxycycline  Nausea And Vomiting   Mushroom Extract Complex (obsolete) Nausea And Vomiting   Tape Other (See Comments)   Some tapes/dressings can irritate the skin        Medication List     TAKE these medications    acetaminophen  325 MG tablet Commonly known as: TYLENOL  Take 2 tablets (650 mg total) by mouth every 4 (four) hours as needed for headache or mild pain (pain score 1-3).   furosemide  20 MG tablet Commonly known as: LASIX  Take 2 tablets (40 mg total) by mouth daily.  magnesium  oxide 400 (240 Mg) MG tablet Commonly known as: MAG-OX Take 1 tablet (400 mg total) by mouth daily.   ONE A DAY MEN 50 PLUS PO Take 1 tablet by mouth daily.   pantoprazole  40 MG tablet Commonly known as: PROTONIX  Take twice a day for 1 month and then can decrease back to once daily. What changed:  how much to take how to take this when to take this additional instructions   potassium chloride  10 MEQ tablet Commonly known as: KLOR-CON  M Take 2 tablets (20 mEq total) by mouth daily.   rosuvastatin  10 MG tablet Commonly known as: CRESTOR  Take 1 tablet (10 mg total) by mouth at bedtime.   sildenafil  20 MG tablet Commonly known as: REVATIO  Take 1 tablet (20 mg total) by mouth 3 (three) times daily.   warfarin 2 MG tablet Commonly known as: COUMADIN  Take as directed. If you are unsure how to take this medication, talk to your nurse or doctor. Original instructions: Take 2 mg by mouth every Tuesday, Thursday, and Saturday and 4 mg all other days        Disposition   The patient will be discharged in stable condition to home. Discharge Instructions     (HEART FAILURE PATIENTS) Call MD:  Anytime  you have any of the following symptoms: 1) 3 pound weight gain in 24 hours or 5 pounds in 1 week 2) shortness of breath, with or without a dry hacking cough 3) swelling in the hands, feet or stomach 4) if you have to sleep on extra pillows at night in order to breathe.   Complete by: As directed        Follow-up Information     Rolan Ezra RAMAN, MD. Go in 2 day(s).   Specialty: Cardiology Why: Please keep appointment with Dr. Rolan on 04/04/2024 at 11:00am. Contact information: [address] [place] [address] [phone]         [facility]. Call.   Specialty: Gastroenterology Why: Please call and scheduled hospital follow-up with GI. Contact information: [address] [place]  [address] [phone]                  APP Duration of Discharge Encounter: 25  Signed, Aline FORBES Door  04/02/2024, 10:48 AM   Patient seen and examined with the above-signed Advanced Practice Provider and/or Housestaff. I personally reviewed laboratory data, imaging studies and relevant notes. I independently examined the patient and formulated the important aspects of the plan. I have edited the note to reflect any of my changes or salient points. I have personally discussed the plan with the patient and/or family.  See my rounding note for full details. OK for d/c today. Has f/u in VAD clinic scheduled  Toribio Fuel, MD  10:11 PM  MD time today 37 mins.   Toribio Fuel, MD  10:11 PM

## 2024-03-23 NOTE — Progress Notes (Addendum)
 Advanced Heart Failure Rounding Note  Cardiologist: Ezra Shuck, MD  Chief Complaint: GIB Subjective:    S/p 2u PRBcs 7/1 and 1 more 7/2. Hgb 7.9 morning.   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 No acute source of bleeding found.   Has not had BM in ~2 days. Otherwise denies further bleeding. Denies CP/SOB. Feels good today. Today [birth year] birthday!  LVAD Interrogation HM III: Speed: 5300 Flow: 4.2 PI: 3.3 Power: 3.8. No PI events today  Objective:   Weight Range: 79.4 kg Body mass index is 25.85 kg/m.   Vital Signs:   Temp:  [97.3 F (36.3 C)-98.6 F (37 C)] 97.5 F (36.4 C) (07/03 0700) Pulse Rate:  [57-118] 90 (07/03 0700) Resp:  [0-26] 12 (07/03 0700) BP: (64-113)/(46-84) 85/63 (07/03 0700) SpO2:  [90 %-100 %] 95 % (07/03 0700) Weight:  [79.4 kg] 79.4 kg (07/03 0500) Last BM Date :  (PTA)  Weight change: Filed Weights   03/22/24 0500 03/23/24 0500  Weight: 79.8 kg 79.4 kg   Intake/Output:   Intake/Output Summary (Last 24 hours) at 03/23/2024 0806 Last data filed at 03/22/2024 1724 Gross per 24 hour  Intake 723.33 ml  Output 975 ml  Net -251.67 ml    Physical Exam  General:  Well appearing. No resp difficulty Neck: supple. JVP ~8.  Cor: Mechanical heart sounds with LVAD hum present. Lungs: Clear Abdomen: soft, nontender, nondistended. Good bowel sounds. Driveline: C/D/I; securement device intact and driveline incorporated Extremities: no cyanosis, clubbing, rash, non-pitting BLE edema Neuro: alert & oriented x3. Moves all 4 extremities w/o difficulty. Affect pleasant   Telemetry   Paced 60s (Personally reviewed)    EKG    No new EKG to review  Labs  CBC Recent Labs    03/22/24 0228 03/22/24 1437 03/23/24 0431  WBC 5.7  --  5.3  HGB 7.4* 8.1* 7.9*  HCT 22.8* 24.8* 24.4*  MCV 91.9  --  91.7  PLT 190  --  177   Basic Metabolic Panel Recent Labs    92/97/74 0228 03/22/24 1437  03/23/24 0431  NA 137 136 135  K 4.2 4.1 3.9  CL 108 109 109  CO2 21* 22 20*  GLUCOSE 89 112* 90  BUN 60* 60* 54*  CREATININE 2.25* 2.48* 2.16*  CALCIUM  8.4* 8.3* 8.2*  MG 2.0  --   --    Liver Function Tests Recent Labs    03/21/24 1500  AST 23  ALT 16  ALKPHOS 62  BILITOT 0.8  PROT 6.6  ALBUMIN  3.5   No results for input(s): LIPASE, AMYLASE in the last 72 hours. Cardiac Enzymes No results for input(s): CKTOTAL, CKMB, CKMBINDEX, TROPONINI in the last 72 hours.  BNP: BNP (last 3 results) Recent Labs    02/05/24 1106  BNP 343.8*   ProBNP (last 3 results) No results for input(s): PROBNP in the last 8760 hours.  D-Dimer No results for input(s): DDIMER in the last 72 hours. Hemoglobin A1C No results for input(s): HGBA1C in the last 72 hours. Fasting Lipid Panel No results for input(s): CHOL, HDL, LDLCALC, TRIG, CHOLHDL, LDLDIRECT in the last 72 hours. Thyroid  Function Tests Recent Labs    03/22/24 0228  TSH 2.542   Other results:  Imaging  No results found.  Medications:   Scheduled Medications:  Chlorhexidine  Gluconate Cloth  6 each Topical Daily   pantoprazole  (PROTONIX ) IV  40 mg Intravenous Q12H  polyethylene glycol  17 g Oral BID   senna-docusate  2 tablet Oral QHS    Infusions:   PRN Medications: acetaminophen , ondansetron  (ZOFRAN ) IV Patient Profile   73 y.o. with history of CAD, HTN, atrial fibrillation, and chronic systolic CHF s/p LVAD HM3. Presenting with GIB.   Assessment/Plan  1. GIB: Patient has epigastric pain, nausea, and melena x 1 week after getting Lovenox  for several days.  INR goal has been increased to 2.5-3 since his CVA. Reports lightheadedness. Hgb 6.4 on admission.  - Transfused 2u RBCs 7/1. 1 more unit 7/2 Hgb 7.9 this morning. 1 more unit today.  - EGD 7/2 with very friable tissue in the duodenum and 4 AVMs that were treated with APC. No acute source of bleeding found.  - GI following. If  bleed is recurrent would plan on colonoscopy.  - Will advance diet, discussed with GI - Continue protonix  40 mg bid - INR goal lowered to 2-2.5 - Start back lasix  at half his home dose. 40 mg daily - Will need monthly octreotide, start SQ octreotide while admitted - CT chest/abdomen/pelvis: no specific noncontrast CT findings to explain GI bleed.   2. Chronic systolic CHF:  Long-standing cardiomyopathy.  [employer] dual chamber ICD.  Echo in 12/22 with EF < 20%, severe LV dilation, restrictive diastolic function, moderate RV dysfunction, moderate MR, mod-severe TR. Cause of cardiomyopathy is uncertain.  He has a history of CAD, but I do not think that the described CAD from the past could explain his cardiomyopathy, but CAD could have progressed.  He was admitted in 12/22 with cardiogenic shock and Impella 5.5. s/p HM III VAD on 09/17/22.  Post-op course complicated by AKI and RV failure. He was on HD for several months but is now off.  Echo in 7/24 showed EF < 20%, IV septum midline to slightly leftward, RV moderately dilated with moderately decreased systolic function, IVC dilated with severe TR.  Echo in 5/25 with EF 20-25%, mild LVH, LVAD present, mild RV dysfunction, moderate AI, mild RV dysfunction with normal RV size, severe TR.   - NYHA class II, not significantly volume overloaded on exam but starting to creep up. LVAD parameters reviewed and stable today, no alarms.  - Restart lasix  40 mg daily (1/2 home dose) - Restart sildenafil  30 mg TID - Hold warfarin. INR 2.2  3. Tricuspid regurgitation:  Tricuspid repair not done at time of VAD due to proximity of ICD wires and hypotension during surgery. He has severe TR.   4. Atrial fibrillation: Paroxysmal.  He is off amiodarone  with resolution of nausea. He was a-paced last office visit but was in AF last hospitalization.  - ECG with AV paced  5. CKD stage IV: Suspect intra-op hypotension at the time of LVAD placement led to development  of ATN => urine sediment looked like ATN per renal. Had initial CVVH and transitioned over to HD. He is now off HD. Creatinine was 2.2 most recently. Worry he could have AKI with possible GI bleeding.  - sCr 2.16, not far from baseline. Will continue to follow - Restarting Lasix .  - Got gentle IVF this admission   6. CAD: History of PCI to OM1 in 2007 and RCA in 2013.  No CP or ACs.  - Deferred cardiac cath in 12/22 due to AKI and plan for VAD - Continue Crestor .    7. Abnormal thyroid  function tests: Patient has had elevated TSH and free T4, mixed pattern. We have stopped amiodarone .  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  Most recent TSH was normal.  - He follows with endocrinology.   - TSH stable 2.5 this admission  8. Nausea: This seems to have been due to amiodarone , resolved off amiodarone .  Now has recurred, possibly due to upper GI bleeding. None at this time.   9. 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  INR goal decreased to 2-2.5.  - hold warfarin as above.   CRITICAL CARE Performed by: Beckey LITTIE Coe  Total critical care time: 14 minutes  Critical care time was exclusive of separately billable procedures and treating other patients.  Critical care was necessary to treat or prevent imminent or life-threatening deterioration.  Critical care was time spent personally by me on the following activities: development of treatment plan with patient and/or surrogate as well as nursing, discussions with consultants, evaluation of patient's response to treatment, examination of patient, obtaining history from patient or surrogate, ordering and performing treatments and interventions, ordering and review of laboratory studies, ordering and review of radiographic studies, pulse oximetry and re-evaluation of patient's condition.   Length of Stay: 2  Beckey LITTIE Coe, NP  03/23/2024, 8:06 AM  Advanced  Heart Failure Team Pager [phone] (M-F; 7a - 5p)  Please contact [facility] for night-coverage after hours (5p -7a ) and weekends on [email or web address]   Patient seen with NP, I formulated the plan and agree with the above note.   EGD yesterday with APC to gastric, duodenal, and jejunal AVMs.  APC to friable duodenal mucosa.  Has not had BMs.  Hgb 7.9 today.  MAP 70s-80s.  Remains on IV Protonix .   INR 2.2 today.   He is A-V sequentially paced.   General: Well appearing this am. NAD.  HEENT: Normal. Neck: Supple, JVP 8-9 cm. Carotids OK.  Cardiac:  Mechanical heart sounds with LVAD hum present.  Lungs:  CTAB, normal effort.  Abdomen:  NT, ND, no HSM. No bruits or masses. +BS  LVAD exit site: Well-healed and incorporated. Dressing dry and intact. No erythema or drainage. Stabilization device present and accurately applied. Driveline dressing changed daily per sterile technique. Extremities:  Warm and dry. No cyanosis, clubbing, rash, or edema.  Neuro:  Alert & oriented x 3. Cranial nerves grossly intact. Moves all 4 extremities w/o difficulty. Affect pleasant    No further overt bleeding s/p EGD treatment.  Will continue PPI, will need octreotide infusions as outpatient for symptomatic GI AVMs.   Transfuse 1 unit PRBCs today.   LVAD parameters and MAP stable.  Restart Lasix  at 40 mg once daily and restart home sildenafil .   Can gently restart warfarin, aiming for INR 2-2.5 (lower goal).  Of note, he had a recent CVA.   Mobilize, think he can go to [facility].   Ezra Shuck 03/23/2024 9:08 AM

## 2024-03-23 NOTE — Progress Notes (Addendum)
 ANTICOAGULATION CONSULT NOTE  Pharmacy Consult for warfarin Indication: HM 3  Allergies  Allergen Reactions   Neosporin [Neomycin-Bacitracin Zn-Polymyx] Hives   Doxycycline  Nausea And Vomiting   Mushroom Extract Complex (Obsolete) Nausea And Vomiting   Tape Other (See Comments)    Some tapes/dressings can irritate the skin    Patient Measurements: Weight: 79.4 kg (175 lb 0.7 oz)  Vital Signs: Temp: 97.5 F (36.4 C) (07/03 0830) Temp Source: Axillary (07/03 0830) BP: 83/71 (07/03 0900) Pulse Rate: 106 (07/03 0900)  Labs: Recent Labs    03/21/24 1500 03/22/24 0228 03/22/24 1437 03/23/24 0431  HGB 6.4* 7.4* 8.1* 7.9*  HCT 20.3* 22.8* 24.8* 24.4*  PLT 241 190  --  177  LABPROT 25.9* 26.7*  --  25.5*  INR 2.2* 2.3*  --  2.2*  CREATININE 2.33* 2.25* 2.48* 2.16*    Estimated Creatinine Clearance: 30.9 mL/min (A) (by C-G formula based on SCr of 2.16 mg/dL (H)).  Medical History: Past Medical History:  Diagnosis Date   Arrhythmia    atrial fibrillation   CHF (congestive heart failure) (HCC)    Chronic kidney disease    Coronary artery disease    Hyperlipidemia    Hypertension    Myocardial infarct [facility])     Medications:  See MAR  Assessment: 73 yo male s/p HM3 LVAD 09/2021 with recent CVA 02/10/24 where INR goal was increased to 2.5-3.  Recently bridged with lovenox  40 mg BID (LVAD protocol) from 6/20-6/24 now presenting with GIB.  Pharmacy consulted for warfarin dosing.  PTA warfarin regimen - 2 mg TuThSa, 4 mg all other days (22 mg/wk) per 6/26 office visit (2 mg tablets) No notable DDI's  Appeared to be therapeutic (INR ranging 2-2.2) on warfarin regimen of 4 mg Mon/Wed/Fri and 2 mg all other days.   INR 2.2 today.  Warfarin has been on hold the past two days since admission - last dose 6/30 PM.    Goal of Therapy:  INR 2-2.5 Monitor platelets by anticoagulation protocol: Yes   Plan:  Warfarin 4 mg PO x 1  Daily INR, CBC Monitor for s/sx of  bleeding  Maurilio Fila, PharmD Clinical Pharmacist 03/23/2024  9:08 AM

## 2024-03-24 ENCOUNTER — Encounter (HOSPITAL_COMMUNITY): Payer: Self-pay | Admitting: Gastroenterology

## 2024-03-24 DIAGNOSIS — K922 Gastrointestinal hemorrhage, unspecified: Secondary | ICD-10-CM | POA: Diagnosis not present

## 2024-03-24 DIAGNOSIS — I5022 Chronic systolic (congestive) heart failure: Secondary | ICD-10-CM | POA: Diagnosis not present

## 2024-03-24 DIAGNOSIS — Z95811 Presence of heart assist device: Secondary | ICD-10-CM | POA: Diagnosis not present

## 2024-03-24 LAB — BPAM RBC
Blood Product Expiration Date: 202508052359
Blood Product Expiration Date: 202508052359
Blood Product Expiration Date: 202508062359
Blood Product Expiration Date: 202508062359
ISSUE DATE / TIME: 202507011608
ISSUE DATE / TIME: 202507011608
ISSUE DATE / TIME: 202507020954
ISSUE DATE / TIME: 202507030850
Unit Type and Rh: 5100
Unit Type and Rh: 5100
Unit Type and Rh: 5100
Unit Type and Rh: 5100

## 2024-03-24 LAB — TYPE AND SCREEN
ABO/RH(D): O POS
Antibody Screen: NEGATIVE
Unit division: 0
Unit division: 0
Unit division: 0
Unit division: 0

## 2024-03-24 LAB — CBC
HCT: 25.5 % — ABNORMAL LOW (ref 39.0–52.0)
HCT: 25 % — ABNORMAL LOW (ref 39.0–52.0)
Hemoglobin: 8.3 g/dL — ABNORMAL LOW (ref 13.0–17.0)
Hemoglobin: 8.3 g/dL — ABNORMAL LOW (ref 13.0–17.0)
MCH: 30.2 pg (ref 26.0–34.0)
MCH: 30.7 pg (ref 26.0–34.0)
MCHC: 32.5 g/dL (ref 30.0–36.0)
MCHC: 33.2 g/dL (ref 30.0–36.0)
MCV: 92.7 fL (ref 80.0–100.0)
MCV: 92.6 fL (ref 80.0–100.0)
Platelets: 193 K/uL (ref 150–400)
Platelets: 185 K/uL (ref 150–400)
RBC: 2.75 MIL/uL — ABNORMAL LOW (ref 4.22–5.81)
RBC: 2.7 MIL/uL — ABNORMAL LOW (ref 4.22–5.81)
RDW: 17.4 % — ABNORMAL HIGH (ref 11.5–15.5)
RDW: 17.3 % — ABNORMAL HIGH (ref 11.5–15.5)
WBC: 5.6 K/uL (ref 4.0–10.5)
WBC: 6.4 K/uL (ref 4.0–10.5)
nRBC: 0 % (ref 0.0–0.2)
nRBC: 0 % (ref 0.0–0.2)

## 2024-03-24 LAB — BASIC METABOLIC PANEL WITH GFR
Anion gap: 7 (ref 5–15)
BUN: 45 mg/dL — ABNORMAL HIGH (ref 8–23)
CO2: 19 mmol/L — ABNORMAL LOW (ref 22–32)
Calcium: 8.2 mg/dL — ABNORMAL LOW (ref 8.9–10.3)
Chloride: 112 mmol/L — ABNORMAL HIGH (ref 98–111)
Creatinine, Ser: 2.04 mg/dL — ABNORMAL HIGH (ref 0.61–1.24)
GFR, Estimated: 34 mL/min — ABNORMAL LOW (ref >60)
Glucose, Bld: 111 mg/dL — ABNORMAL HIGH (ref 70–99)
Potassium: 4.5 mmol/L (ref 3.5–5.1)
Sodium: 138 mmol/L (ref 135–145)

## 2024-03-24 LAB — LACTATE DEHYDROGENASE: LDH: 119 U/L (ref 98–192)

## 2024-03-24 LAB — PROTIME-INR
INR: 2.5 — ABNORMAL HIGH (ref 0.8–1.2)
Prothrombin Time: 27.9 s — ABNORMAL HIGH (ref 11.4–15.2)

## 2024-03-24 MED ORDER — FUROSEMIDE 40 MG PO TABS
40.0000 mg | ORAL_TABLET | Freq: Two times a day (BID) | ORAL | Status: DC
  Administered 2024-03-24: 40 mg via ORAL
  Filled 2024-03-24: qty 1

## 2024-03-24 MED ORDER — LACTATED RINGERS IV BOLUS
500.0000 mL | Freq: Once | INTRAVENOUS | Status: AC
Start: 2024-03-24 — End: 2024-03-25

## 2024-03-24 NOTE — Progress Notes (Addendum)
  Called by nursing staff for nausea and weakness.    Mr Rouillard walked in the bathroom and had large black BM. His wife was in the room and says he was straining to have BM. After that he felt nauseated and dizzy.  No change in neuro exam. Maps stable 80s.   VAD parameters reviewed with VAD coordinator. Speed dropped during that time.  Suspect vagal and volume depletion. Hold lasix .   Given 500 cc LR bolus. Check stat H & H. If Hgb lower will given additional transfusion.    Eliese Kerwood NP-C  10:36 AM

## 2024-03-24 NOTE — Progress Notes (Signed)
 LVAD Coordinator Rounding Note:  Isaac [name] admitted 03/21/24 for suspected GIB. Isaac [name] states he started feeling poorly Sunday noting dizziness and generalized fatigue that has continued to worsen throughout the week. Yesterday morning his wife states he fell at home. Denies head trauma. 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 Isaac [name] was subtherapeutic 6/20 and bridged with Lovenox  till 6/24.   Hgb 6.4 on arrival Isaac [name] received 2 PRBC. CT chest/abdomen/pelvis obtained overnight.GI consulted. Awaiting recommendations.   IMPRESSION: 1. No acute noncontrast CT abnormality of the chest, abdomen, or pelvis. No specific noncontrast CT findings to explain GI bleed. 2. Moderate burden of stool throughout the colon and rectum. 3. Emphysema and diffuse bilateral bronchial wall thickening. 4. Cardiomegaly and coronary artery disease. 5. Cholelithiasis. 6. Prostatomegaly.  Isaac [name] lying in bed this morning. Hgb 8.3. Enteroscopy performed 03/22/24 . 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 GI recommends if bleeding continues Isaac [name] may need a colonoscopy. Isaac [name] receiving SQ Octreotide . Will set outpatient injections upon discharge.  Vital signs: Temp:  HR: 60 Doppler Pressure: 74 Auto Cuff BP: 82/61 (69) O2 Sat: 99% on RA Wt: 175.9>175.1 lbs    LVAD interrogation reveals:  Speed: 5300 Flow: 4.5 Power: 3.9 w PI: 2.7 Hematocrit: 37  Alarms: none Events: rare  Fixed speed: 5300 Low speed limit: 5000  Isaac [name] has hospital loaner back up back on VAD cart.   Drive Line:  Existing VAD dressing C/D/I with anchor intact and accurately applied. Weekly dressing changes per VAD coordinator or pts wife. Next dressing change due: 03/22/24 by caregiver. Weekly kit on VAD cart.   Labs:  Hgb trend: 6.4>7.4>8.1>7.9>9.1>8.3  LDH trend: 124>102>107>119  INR trend: 2.2>2.3>2.2>2.5  Anticoagulation Plan: -INR Goal:  2-2.5 lowered this admission d/t GIB  Blood Products: 03/21/24: 2 PRBC 03/22/24: 1 PRBC 03/23/24: 1 PRBC  Device: - Cabin crew - V paced 60 -Therapies: OFF  Infection:   Plan/Recommendations:  Page VAD coordinator for drive line or equipment concerns 2. Weekly dressing changes per VAD Coordinator or caregiver.   Schuyler Lunger RN, BSN VAD Coordinator 24/7 Pager [phone]

## 2024-03-24 NOTE — Progress Notes (Addendum)
 Patient ID: Isaac [name], male   DOB: 05-18-1951, 73 y.o.   MRN: [id number]     Advanced Heart Failure Rounding Note  Cardiologist: Ezra Shuck, MD  Chief Complaint: GIB Subjective:    S/p 2u PRBcs 7/1, 1 unit 7/2, 1 unit 7/3.  Hgb 8.3 this morning.  He had a bowel movement overnight, still black. BUN/creatinine trending down, MAP 70s.   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    No complaints this morning but has not been out of bed.   LVAD Interrogation HM III: Speed: 5300 Flow: 4.3 PI: 3.3 Power: 3.8.   Objective:   Weight Range: 79.4 kg Body mass index is 25.85 kg/m.   Vital Signs:   Temp:  [97.5 F (36.4 C)-98 F (36.7 C)] 97.8 F (36.6 C) (07/04 0728) Pulse Rate:  [58-142] 75 (07/04 0700) Resp:  [11-23] 16 (07/04 0700) BP: (68-112)/(52-87) 84/66 (07/04 0700) SpO2:  [98 %-100 %] 100 % (07/04 0700) Last BM Date : 03/23/24  Weight change: Filed Weights   03/22/24 0500 03/23/24 0500  Weight: 79.8 kg 79.4 kg   Intake/Output:   Intake/Output Summary (Last 24 hours) at 03/24/2024 0752 Last data filed at 03/24/2024 9366 Gross per 24 hour  Intake 1031 ml  Output 1925 ml  Net -894 ml    Physical Exam  General: Well appearing this am. NAD.  HEENT: Normal. Neck: Supple, JVP 8 cm. Carotids OK.  Cardiac:  Mechanical heart sounds with LVAD hum present.  Lungs:  CTAB, normal effort.  Abdomen:  NT, ND, no HSM. No bruits or masses. +BS  LVAD exit site: Well-healed and incorporated. Dressing dry and intact. No erythema or drainage. Stabilization device present and accurately applied. Driveline dressing changed daily per sterile technique. Extremities:  Warm and dry. No cyanosis, clubbing, rash, or edema.  Neuro:  Alert & oriented x 3. Cranial nerves grossly intact. Moves all 4 extremities w/o difficulty. Affect pleasant    Telemetry   A-paced 60s (Personally reviewed)    EKG    No new EKG to review  Labs   CBC Recent Labs    03/23/24 1246 03/24/24 0426  WBC 5.6 5.6  HGB 9.1* 8.3*  HCT 27.6* 25.5*  MCV 92.0 92.7  PLT 183 193   Basic Metabolic Panel Recent Labs    92/97/74 0228 03/22/24 1437 03/23/24 0430 03/23/24 0431 03/24/24 0426  NA 137   < >  --  135 138  K 4.2   < >  --  3.9 4.5  CL 108   < >  --  109 112*  CO2 21*   < >  --  20* 19*  GLUCOSE 89   < >  --  90 111*  BUN 60*   < >  --  54* 45*  CREATININE 2.25*   < >  --  2.16* 2.04*  CALCIUM  8.4*   < >  --  8.2* 8.2*  MG 2.0  --  2.1  --   --    < > = values in this interval not displayed.   Liver Function Tests Recent Labs    03/21/24 1500  AST 23  ALT 16  ALKPHOS 62  BILITOT 0.8  PROT 6.6  ALBUMIN  3.5   No results for input(s): LIPASE, AMYLASE in the last 72 hours. Cardiac Enzymes No results for input(s): CKTOTAL, CKMB, CKMBINDEX, TROPONINI in the last 72 hours.  BNP: BNP (  last 3 results) Recent Labs    02/05/24 1106  BNP 343.8*   ProBNP (last 3 results) No results for input(s): PROBNP in the last 8760 hours.  D-Dimer No results for input(s): DDIMER in the last 72 hours. Hemoglobin A1C No results for input(s): HGBA1C in the last 72 hours. Fasting Lipid Panel No results for input(s): CHOL, HDL, LDLCALC, TRIG, CHOLHDL, LDLDIRECT in the last 72 hours. Thyroid  Function Tests Recent Labs    03/22/24 0228  TSH 2.542   Other results:  Imaging  No results found.  Medications:   Scheduled Medications:  Chlorhexidine  Gluconate Cloth  6 each Topical Daily   furosemide   40 mg Oral BID   lactulose   20 g Oral TID   multivitamin with minerals  1 tablet Oral Daily   pantoprazole   40 mg Oral BID   polyethylene glycol  17 g Oral BID   rosuvastatin   10 mg Oral QHS   senna-docusate  2 tablet Oral QHS   sildenafil   20 mg Oral TID   Warfarin - Pharmacist Dosing Inpatient   Does not apply q1600    Infusions:   PRN Medications: acetaminophen , ondansetron  (ZOFRAN )  IV Patient Profile   73 y.o. with history of CAD, HTN, atrial fibrillation, and chronic systolic CHF s/p LVAD HM3. Presenting with GIB.   Assessment/Plan  1. GIB: Patient has epigastric pain, nausea, and melena x 1 week after getting Lovenox  for several days.  INR goal had been increased to 2.5-3 since his CVA. Reports lightheadedness. Hgb 6.4 on admission. Has had 4 units PRBCs so far.  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  Hgb 8.3 today.  - Repeat CBC in pm.  - GI following.  - Continue protonix  40 mg po bid - INR goal lowered to 2-2.5 => INR 2.5 today, will hold warfarin today.  - Recommended monthly octreotide  as outpatient, patient and wife want to think about this.   2. Chronic systolic CHF:  Long-standing cardiomyopathy.  [employer] dual chamber ICD.  Echo in 12/22 with EF < 20%, severe LV dilation, restrictive diastolic function, moderate RV dysfunction, moderate MR, mod-severe TR. Cause of cardiomyopathy is uncertain.  He has a history of CAD, but I do not think that the described CAD from the past could explain his cardiomyopathy, but CAD could have progressed.  He was admitted in 12/22 with cardiogenic shock and Impella 5.5. s/p HM III VAD on 09/17/22.  Post-op course complicated by AKI and RV failure. He was on HD for several months but is now off.  Echo in 7/24 showed EF < 20%, IV septum midline to slightly leftward, RV moderately dilated with moderately decreased systolic function, IVC dilated with severe TR.  Echo in 5/25 with EF 20-25%, mild LVH, LVAD present, mild RV dysfunction, moderate AI, mild RV dysfunction with normal RV size, severe TR.  Stable NYHA class II, MAP 70s and LVAD parameters stable today. Not markedly volume overloaded.  - Can increase Lasix  back to home dose 40 mg bid.  - Continue sildenafil  20 mg TID - Warfarin with INR goal 2-2.5. 3. Tricuspid regurgitation:  Tricuspid repair not done at time of VAD due  to proximity of ICD wires and hypotension during surgery. He has severe TR.  4. Atrial fibrillation: Paroxysmal.  He is off amiodarone  with resolution of nausea. Currently a-paced.  5. CKD stage IV: Suspect intra-op hypotension at the time of LVAD placement led to development  of ATN => urine sediment looked like ATN per renal. Had initial CVVH and transitioned over to HD. He is now off HD.  Creatinine stable 2.04 today.  This is his baseline.  - Can increase to home Lasix  dose as above.  6. CAD: History of PCI to OM1 in 2007 and RCA in 2013.  No CP or ACs.  - Deferred cardiac cath in 12/22 due to AKI and plan for VAD - Continue Crestor .   7. Abnormal thyroid  function tests: Patient has had elevated TSH and free T4, mixed pattern. We have stopped amiodarone . 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  TSH normal this admission.  - He follows with endocrinology.   8. Nausea: This seems to have been due to amiodarone , resolved off amiodarone .  Now has recurred, possibly due to upper GI bleeding. None at this time.  9. 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  INR goal increased to 2.5-3 post-CVA, now goal decreased back to 2-2.5.   CBC lower this morning, recheck in afternoon.   Can go home when CBC stabilized.  Mobilize, think he can go to [facility].   Ezra Shuck 03/24/2024 7:52 AM

## 2024-03-24 NOTE — Progress Notes (Signed)
 ANTICOAGULATION CONSULT NOTE  Pharmacy Consult for warfarin Indication: HM 3  Allergies  Allergen Reactions   Neosporin [Neomycin-Bacitracin Zn-Polymyx] Hives   Doxycycline  Nausea And Vomiting   Mushroom Extract Complex (Obsolete) Nausea And Vomiting   Tape Other (See Comments)    Some tapes/dressings can irritate the skin    Patient Measurements: Weight: 79.4 kg (175 lb 0.7 oz)  Vital Signs: Temp: 97.8 F (36.6 C) (07/04 0728) Temp Source: Oral (07/04 0728) BP: 84/66 (07/04 0700) Pulse Rate: 75 (07/04 0700)  Labs: Recent Labs    03/22/24 0228 03/22/24 1437 03/23/24 0431 03/23/24 1246 03/24/24 0426  HGB 7.4* 8.1* 7.9* 9.1* 8.3*  HCT 22.8* 24.8* 24.4* 27.6* 25.5*  PLT 190  --  177 183 193  LABPROT 26.7*  --  25.5*  --  27.9*  INR 2.3*  --  2.2*  --  2.5*  CREATININE 2.25* 2.48* 2.16*  --  2.04*    Estimated Creatinine Clearance: 32.7 mL/min (A) (by C-G formula based on SCr of 2.04 mg/dL (H)).  Medical History: Past Medical History:  Diagnosis Date   Arrhythmia    atrial fibrillation   CHF (congestive heart failure) (HCC)    Chronic kidney disease    Coronary artery disease    Hyperlipidemia    Hypertension    Myocardial infarct [facility])     Medications:  See MAR  Assessment: 73 yo male s/p HM3 LVAD 09/2021 with recent CVA 02/10/24 where INR goal was increased to 2.5-3.  Recently bridged with lovenox  40 mg BID (LVAD protocol) from 6/20-6/24 now presenting with GIB.  Pharmacy consulted for warfarin dosing.  PTA warfarin regimen - 2 mg TuThSa, 4 mg all other days (22 mg/wk) per 6/26 office visit (2 mg tablets) No notable DDI's  Appeared to be therapeutic (INR ranging 2-2.2) on warfarin regimen of 4 mg Mon/Wed/Fri and 2 mg all other days.   INR 2.5 today on upper end of therapeutic range.  Warfarin held for two days (7/1-2, last dose 6/30 PM) and restarted yesterday.  Goal of Therapy:  INR 2-2.5 Monitor platelets by anticoagulation protocol: Yes   Plan:   HOLD warfarin given  Daily INR, CBC Monitor for s/sx of bleeding  Maurilio Fila, PharmD Clinical Pharmacist 03/24/2024  7:50 AM

## 2024-03-25 DIAGNOSIS — K922 Gastrointestinal hemorrhage, unspecified: Secondary | ICD-10-CM | POA: Diagnosis not present

## 2024-03-25 DIAGNOSIS — Z95811 Presence of heart assist device: Secondary | ICD-10-CM | POA: Diagnosis not present

## 2024-03-25 DIAGNOSIS — I5022 Chronic systolic (congestive) heart failure: Secondary | ICD-10-CM | POA: Diagnosis not present

## 2024-03-25 LAB — CBC
HCT: 24.6 % — ABNORMAL LOW (ref 39.0–52.0)
Hemoglobin: 7.8 g/dL — ABNORMAL LOW (ref 13.0–17.0)
MCH: 29.4 pg (ref 26.0–34.0)
MCHC: 31.7 g/dL (ref 30.0–36.0)
MCV: 92.8 fL (ref 80.0–100.0)
Platelets: 204 K/uL (ref 150–400)
RBC: 2.65 MIL/uL — ABNORMAL LOW (ref 4.22–5.81)
RDW: 16.9 % — ABNORMAL HIGH (ref 11.5–15.5)
WBC: 5.7 K/uL (ref 4.0–10.5)
nRBC: 0 % (ref 0.0–0.2)

## 2024-03-25 LAB — BASIC METABOLIC PANEL WITH GFR
Anion gap: 8 (ref 5–15)
BUN: 54 mg/dL — ABNORMAL HIGH (ref 8–23)
CO2: 20 mmol/L — ABNORMAL LOW (ref 22–32)
Calcium: 8.5 mg/dL — ABNORMAL LOW (ref 8.9–10.3)
Chloride: 111 mmol/L (ref 98–111)
Creatinine, Ser: 2.24 mg/dL — ABNORMAL HIGH (ref 0.61–1.24)
GFR, Estimated: 30 mL/min — ABNORMAL LOW (ref >60)
Glucose, Bld: 98 mg/dL (ref 70–99)
Potassium: 4.1 mmol/L (ref 3.5–5.1)
Sodium: 139 mmol/L (ref 135–145)

## 2024-03-25 LAB — PROTIME-INR
INR: 3.4 — ABNORMAL HIGH (ref 0.8–1.2)
Prothrombin Time: 35.7 s — ABNORMAL HIGH (ref 11.4–15.2)

## 2024-03-25 LAB — LACTATE DEHYDROGENASE: LDH: 113 U/L (ref 98–192)

## 2024-03-25 LAB — PREPARE RBC (CROSSMATCH)

## 2024-03-25 MED ORDER — SODIUM CHLORIDE 0.9% IV SOLUTION: Freq: Once | INTRAVENOUS | Status: AC

## 2024-03-25 MED ORDER — ENSURE PLUS HIGH PROTEIN PO LIQD
237.0000 mL | Freq: Two times a day (BID) | ORAL | Status: AC
  Administered 2024-03-25 (×2): 237 mL via ORAL

## 2024-03-25 MED ORDER — ENSURE PLUS HIGH PROTEIN PO LIQD: 237.0000 mL | Freq: Three times a day (TID) | ORAL | Status: DC

## 2024-03-25 NOTE — Progress Notes (Signed)
 Patient ID: Isaac [name], male   DOB: March 04, 1951, 73 y.o.   MRN: [id number]     Advanced Heart Failure Rounding Note  Cardiologist: Ezra Shuck, MD  Chief Complaint: GIB Subjective:    S/p 2u PRBcs 7/1, 1 unit 7/2, 1 unit 7/3.  Hgb 7.8 this morning.  Black BM yesterday, no further bowel movements.   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    No complaints this morning. INR 3.4 today but did not get warfarin yesterday.   LVAD Interrogation HM III: Speed: 5300 Flow: 4.2 PI: 3.3 Power: 3.8.   Objective:   Weight Range: 79.4 kg Body mass index is 25.85 kg/m.   Vital Signs:   Temp:  [97.9 F (36.6 C)-98 F (36.7 C)] 97.9 F (36.6 C) (07/05 0738) Pulse Rate:  [54-142] 62 (07/05 0738) Resp:  [10-24] 15 (07/04 1915) BP: (65-95)/(42-81) 84/61 (07/05 0738) SpO2:  [97 %-100 %] 100 % (07/04 1915) Last BM Date : 03/24/24  Weight change: Filed Weights   03/22/24 0500 03/23/24 0500  Weight: 79.8 kg 79.4 kg   Intake/Output:   Intake/Output Summary (Last 24 hours) at 03/25/2024 1038 Last data filed at 03/25/2024 0900 Gross per 24 hour  Intake 100 ml  Output 1675 ml  Net -1575 ml    Physical Exam   General: Well appearing this am. NAD.  HEENT: Normal. Neck: Supple, JVP 8 cm. Carotids OK.  Cardiac:  Mechanical heart sounds with LVAD hum present.  Lungs:  CTAB, normal effort.  Abdomen:  NT, ND, no HSM. No bruits or masses. +BS  LVAD exit site: Well-healed and incorporated. Dressing dry and intact. No erythema or drainage. Stabilization device present and accurately applied. Driveline dressing changed daily per sterile technique. Extremities:  Warm and dry. No cyanosis, clubbing, rash, or edema.  Neuro:  Alert & oriented x 3. Cranial nerves grossly intact. Moves all 4 extremities w/o difficulty. Affect pleasant    Telemetry   A-paced 60s (Personally reviewed)    EKG    No new EKG to review  Labs  CBC Recent Labs     03/24/24 1113 03/25/24 0535  WBC 6.4 5.7  HGB 8.3* 7.8*  HCT 25.0* 24.6*  MCV 92.6 92.8  PLT 185 204   Basic Metabolic Panel Recent Labs    92/96/74 0430 03/23/24 0431 03/24/24 0426 03/25/24 0535  NA  --    < > 138 139  K  --    < > 4.5 4.1  CL  --    < > 112* 111  CO2  --    < > 19* 20*  GLUCOSE  --    < > 111* 98  BUN  --    < > 45* 54*  CREATININE  --    < > 2.04* 2.24*  CALCIUM   --    < > 8.2* 8.5*  MG 2.1  --   --   --    < > = values in this interval not displayed.   Liver Function Tests No results for input(s): AST, ALT, ALKPHOS, BILITOT, PROT, ALBUMIN  in the last 72 hours.  No results for input(s): LIPASE, AMYLASE in the last 72 hours. Cardiac Enzymes No results for input(s): CKTOTAL, CKMB, CKMBINDEX, TROPONINI in the last 72 hours.  BNP: BNP (last 3 results) Recent Labs    02/05/24 1106  BNP 343.8*   ProBNP (last 3 results) No results for input(s): PROBNP in  the last 8760 hours.  D-Dimer No results for input(s): DDIMER in the last 72 hours. Hemoglobin A1C No results for input(s): HGBA1C in the last 72 hours. Fasting Lipid Panel No results for input(s): CHOL, HDL, LDLCALC, TRIG, CHOLHDL, LDLDIRECT in the last 72 hours. Thyroid  Function Tests No results for input(s): TSH, T4TOTAL, T3FREE, THYROIDAB in the last 72 hours.  Invalid input(s): FREET3  Other results:  Imaging  No results found.  Medications:   Scheduled Medications:  sodium chloride    Intravenous Once   Chlorhexidine  Gluconate Cloth  6 each Topical Daily   multivitamin with minerals  1 tablet Oral Daily   pantoprazole   40 mg Oral BID   polyethylene glycol  17 g Oral BID   rosuvastatin   10 mg Oral QHS   senna-docusate  2 tablet Oral QHS   sildenafil   20 mg Oral TID   Warfarin - Pharmacist Dosing Inpatient   Does not apply q1600    Infusions:   PRN Medications: acetaminophen , ondansetron  (ZOFRAN ) IV Patient Profile   73  y.o. with history of CAD, HTN, atrial fibrillation, and chronic systolic CHF s/p LVAD HM3. Presenting with GIB.   Assessment/Plan  1. GIB: Patient has epigastric pain, nausea, and melena x 1 week after getting Lovenox  for several days.  INR goal had been increased to 2.5-3 since his CVA. Reports lightheadedness. Hgb 6.4 on admission. Has had 4 units PRBCs so far.  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  Hgb 7.8 today.  Had melena early yesterday, nothing since.  - GI following.  - Will give 1 unit PRBCs today.  - Continue protonix  40 mg po bid - INR goal lowered to 2-2.5 => INR 3.4 today, continue to hold warfarin.   - Recommended monthly octreotide  as outpatient, patient and wife want to think about this.   2. Chronic systolic CHF:  Long-standing cardiomyopathy.  [employer] dual chamber ICD.  Echo in 12/22 with EF < 20%, severe LV dilation, restrictive diastolic function, moderate RV dysfunction, moderate MR, mod-severe TR. Cause of cardiomyopathy is uncertain.  He has a history of CAD, but I do not think that the described CAD from the past could explain his cardiomyopathy, but CAD could have progressed.  He was admitted in 12/22 with cardiogenic shock and Impella 5.5. s/p HM III VAD on 09/17/22.  Post-op course complicated by AKI and RV failure. He was on HD for several months but is now off.  Echo in 7/24 showed EF < 20%, IV septum midline to slightly leftward, RV moderately dilated with moderately decreased systolic function, IVC dilated with severe TR.  Echo in 5/25 with EF 20-25%, mild LVH, LVAD present, mild RV dysfunction, moderate AI, mild RV dysfunction with normal RV size, severe TR.  Stable NYHA class II, MAP 70s and LVAD parameters stable today. Not markedly volume overloaded.  - Lasix  on hold with bleeding.  - Continue sildenafil  20 mg TID - Warfarin with INR goal 2-2.5. 3. Tricuspid regurgitation:  Tricuspid repair not done at time  of VAD due to proximity of ICD wires and hypotension during surgery. He has severe TR.  4. Atrial fibrillation: Paroxysmal.  He is off amiodarone  with resolution of nausea. Currently a-paced.  5. CKD stage IV: Suspect intra-op hypotension at the time of LVAD placement led to development of ATN => urine sediment looked like ATN per renal. Had initial CVVH and transitioned over to HD. He is now off  HD.  Creatinine stable 2.04 today.  This is his baseline.  - Can increase to home Lasix  dose as above.  6. CAD: History of PCI to OM1 in 2007 and RCA in 2013.  No CP or ACs.  - Deferred cardiac cath in 12/22 due to AKI and plan for VAD - Continue Crestor .   7. Abnormal thyroid  function tests: Patient has had elevated TSH and free T4, mixed pattern. We have stopped amiodarone . 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  TSH normal this admission.  - He follows with endocrinology.   8. Nausea: This seems to have been due to amiodarone , resolved off amiodarone .  Now has recurred, possibly due to upper GI bleeding. None at this time.  9. 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  INR goal increased to 2.5-3 post-CVA, now goal decreased back to 2-2.5.   Getting 1 unit PRBCs this morning.   Can go home when CBC stabilized. Mobilize today.    Ezra Shuck 03/25/2024 10:38 AM

## 2024-03-25 NOTE — Progress Notes (Signed)
 ANTICOAGULATION CONSULT NOTE  Pharmacy Consult for warfarin Indication: HM 3  Allergies  Allergen Reactions   Neosporin [Neomycin-Bacitracin Zn-Polymyx] Hives   Doxycycline  Nausea And Vomiting   Mushroom Extract Complex (Obsolete) Nausea And Vomiting   Tape Other (See Comments)    Some tapes/dressings can irritate the skin    Patient Measurements: Weight: 79.4 kg (175 lb 0.7 oz)  Vital Signs: Temp: 97.9 F (36.6 C) (07/05 0738) Temp Source: Oral (07/05 0738) BP: 84/61 (07/05 0738) Pulse Rate: 62 (07/05 0738)  Labs: Recent Labs    03/23/24 0431 03/23/24 1246 03/24/24 0426 03/24/24 1113 03/25/24 0535  HGB 7.9*   < > 8.3* 8.3* 7.8*  HCT 24.4*   < > 25.5* 25.0* 24.6*  PLT 177   < > 193 185 204  LABPROT 25.5*  --  27.9*  --  35.7*  INR 2.2*  --  2.5*  --  3.4*  CREATININE 2.16*  --  2.04*  --  2.24*   < > = values in this interval not displayed.    Estimated Creatinine Clearance: 29.8 mL/min (A) (by C-G formula based on SCr of 2.24 mg/dL (H)).  Medical History: Past Medical History:  Diagnosis Date   Arrhythmia    atrial fibrillation   CHF (congestive heart failure) (HCC)    Chronic kidney disease    Coronary artery disease    Hyperlipidemia    Hypertension    Myocardial infarct [facility])     Medications:  See MAR  Assessment: 73 yo male s/p HM3 LVAD 09/2021 with recent CVA 02/10/24 where INR goal was increased to 2.5-3.  Recently bridged with lovenox  40 mg BID (LVAD protocol) from 6/20-6/24 now presenting with GIB.  Pharmacy consulted for warfarin dosing.  PTA warfarin regimen - 2 mg TuThSa, 4 mg all other days (22 mg/wk) per 6/26 office visit (2 mg tablets) No notable DDI's  Appeared to be therapeutic (INR ranging 2-2.2) on warfarin regimen of 4 mg Mon/Wed/Fri and 2 mg all other days.   INR is supratherapeutic at 3.4 - lost dose warfarin 7/3. Hgb 7.8, plt 204. LDH stable at 113.    Goal of Therapy:  INR 2-2.5 Monitor platelets by anticoagulation protocol:  Yes   Plan:  Hold warfarin tonight - will order ensure x2 today Daily INR, CBC Monitor for s/sx of bleeding  Thank you for allowing pharmacy to participate in this patient's care,  Suzen Sour, PharmD, BCCCP Clinical Pharmacist  Phone: [phone] 03/25/2024 10:40 AM  Please check AMION for all [facility] Pharm[facility] phone numbers After 10:00 PM, call Main Pharmacy [phone]

## 2024-03-25 NOTE — Plan of Care (Signed)
 Problem: Education: Goal: Patient will understand all VAD equipment and how it functions 03/25/2024 1510 by Clotilda Jovita SAUNDERS, RN Outcome: Progressing 03/25/2024 1510 by Clotilda Jovita SAUNDERS, RN Outcome: Progressing 03/25/2024 1510 by Clotilda Jovita SAUNDERS, RN Outcome: Progressing Goal: Patient will be able to verbalize current INR target range and antiplatelet therapy for discharge home 03/25/2024 1510 by Clotilda Jovita SAUNDERS, RN Outcome: Progressing 03/25/2024 1510 by Clotilda Jovita SAUNDERS, RN Outcome: Progressing 03/25/2024 1510 by Clotilda Jovita SAUNDERS, RN Outcome: Progressing   Problem: Cardiac: Goal: LVAD will function as expected and patient will experience no clinical alarms 03/25/2024 1510 by Clotilda Jovita SAUNDERS, RN Outcome: Progressing 03/25/2024 1510 by Clotilda Jovita SAUNDERS, RN Outcome: Progressing 03/25/2024 1510 by Clotilda Jovita SAUNDERS, RN Outcome: Progressing   Problem: Education: Goal: Knowledge of General Education information will improve Description: Including pain rating scale, medication(s)/side effects and non-pharmacologic comfort measures 03/25/2024 1510 by Clotilda Jovita SAUNDERS, RN Outcome: Progressing 03/25/2024 1510 by Clotilda Jovita SAUNDERS, RN Outcome: Progressing 03/25/2024 1510 by Clotilda Jovita SAUNDERS, RN Outcome: Progressing   Problem: Health Behavior/Discharge Planning: Goal: Ability to manage health-related needs will improve 03/25/2024 1510 by Clotilda Jovita SAUNDERS, RN Outcome: Progressing 03/25/2024 1510 by Clotilda Jovita SAUNDERS, RN Outcome: Progressing 03/25/2024 1510 by Clotilda Jovita SAUNDERS, RN Outcome: Progressing   Problem: Clinical Measurements: Goal: Ability to maintain clinical measurements within normal limits will improve 03/25/2024 1510 by Clotilda Jovita SAUNDERS, RN Outcome: Progressing 03/25/2024 1510 by Clotilda Jovita SAUNDERS, RN Outcome: Progressing 03/25/2024 1510 by Clotilda Jovita SAUNDERS, RN Outcome: Progressing Goal: Will remain free from infection 03/25/2024 1510 by Clotilda Jovita SAUNDERS, RN Outcome: Progressing 03/25/2024 1510 by Clotilda Jovita SAUNDERS,  RN Outcome: Progressing 03/25/2024 1510 by Clotilda Jovita SAUNDERS, RN Outcome: Progressing Goal: Diagnostic test results will improve 03/25/2024 1510 by Clotilda Jovita SAUNDERS, RN Outcome: Progressing 03/25/2024 1510 by Clotilda Jovita SAUNDERS, RN Outcome: Progressing 03/25/2024 1510 by Clotilda Jovita SAUNDERS, RN Outcome: Progressing Goal: Respiratory complications will improve 03/25/2024 1510 by Clotilda Jovita SAUNDERS, RN Outcome: Progressing 03/25/2024 1510 by Clotilda Jovita SAUNDERS, RN Outcome: Progressing 03/25/2024 1510 by Clotilda Jovita SAUNDERS, RN Outcome: Progressing Goal: Cardiovascular complication will be avoided 03/25/2024 1510 by Clotilda Jovita SAUNDERS, RN Outcome: Progressing 03/25/2024 1510 by Clotilda Jovita SAUNDERS, RN Outcome: Progressing 03/25/2024 1510 by Clotilda Jovita SAUNDERS, RN Outcome: Progressing   Problem: Activity: Goal: Risk for activity intolerance will decrease 03/25/2024 1510 by Clotilda Jovita SAUNDERS, RN Outcome: Progressing 03/25/2024 1510 by Clotilda Jovita SAUNDERS, RN Outcome: Progressing   Problem: Nutrition: Goal: Adequate nutrition will be maintained 03/25/2024 1510 by Clotilda Jovita SAUNDERS, RN Outcome: Progressing 03/25/2024 1510 by Clotilda Jovita SAUNDERS, RN Outcome: Progressing   Problem: Coping: Goal: Level of anxiety will decrease 03/25/2024 1510 by Clotilda Jovita SAUNDERS, RN Outcome: Progressing 03/25/2024 1510 by Clotilda Jovita SAUNDERS, RN Outcome: Progressing   Problem: Elimination: Goal: Will not experience complications related to bowel motility 03/25/2024 1510 by Clotilda Jovita SAUNDERS, RN Outcome: Progressing 03/25/2024 1510 by Clotilda Jovita SAUNDERS, RN Outcome: Progressing Goal: Will not experience complications related to urinary retention 03/25/2024 1510 by Clotilda Jovita SAUNDERS, RN Outcome: Progressing 03/25/2024 1510 by Clotilda Jovita SAUNDERS, RN Outcome: Progressing   Problem: Pain Managment: Goal: General experience of comfort will improve and/or be controlled 03/25/2024 1510 by Clotilda Jovita SAUNDERS, RN Outcome: Progressing 03/25/2024 1510 by Clotilda Jovita SAUNDERS, RN Outcome:  Progressing   Problem: Safety: Goal: Ability to remain free from injury will improve 03/25/2024 1510 by Clotilda Jovita SAUNDERS, RN Outcome: Progressing 03/25/2024 1510 by Clotilda Jovita SAUNDERS, RN  Outcome: Progressing   Problem: Skin Integrity: Goal: Risk for impaired skin integrity will decrease 03/25/2024 1510 by Clotilda Jovita SAUNDERS, RN Outcome: Progressing 03/25/2024 1510 by Clotilda Jovita SAUNDERS, RN Outcome: Progressing

## 2024-03-26 DIAGNOSIS — I5022 Chronic systolic (congestive) heart failure: Secondary | ICD-10-CM | POA: Diagnosis not present

## 2024-03-26 DIAGNOSIS — K922 Gastrointestinal hemorrhage, unspecified: Secondary | ICD-10-CM | POA: Diagnosis not present

## 2024-03-26 DIAGNOSIS — Z95811 Presence of heart assist device: Secondary | ICD-10-CM | POA: Diagnosis not present

## 2024-03-26 LAB — BASIC METABOLIC PANEL WITH GFR
Anion gap: 8 (ref 5–15)
BUN: 58 mg/dL — ABNORMAL HIGH (ref 8–23)
CO2: 20 mmol/L — ABNORMAL LOW (ref 22–32)
Calcium: 8.5 mg/dL — ABNORMAL LOW (ref 8.9–10.3)
Chloride: 109 mmol/L (ref 98–111)
Creatinine, Ser: 2.31 mg/dL — ABNORMAL HIGH (ref 0.61–1.24)
GFR, Estimated: 29 mL/min — ABNORMAL LOW (ref >60)
Glucose, Bld: 95 mg/dL (ref 70–99)
Potassium: 4.5 mmol/L (ref 3.5–5.1)
Sodium: 137 mmol/L (ref 135–145)

## 2024-03-26 LAB — CBC
HCT: 22.4 % — ABNORMAL LOW (ref 39.0–52.0)
Hemoglobin: 7.6 g/dL — ABNORMAL LOW (ref 13.0–17.0)
MCH: 30.8 pg (ref 26.0–34.0)
MCHC: 33.9 g/dL (ref 30.0–36.0)
MCV: 90.7 fL (ref 80.0–100.0)
Platelets: 190 K/uL (ref 150–400)
RBC: 2.47 MIL/uL — ABNORMAL LOW (ref 4.22–5.81)
RDW: 16 % — ABNORMAL HIGH (ref 11.5–15.5)
WBC: 6.5 K/uL (ref 4.0–10.5)
nRBC: 0 % (ref 0.0–0.2)

## 2024-03-26 LAB — LACTATE DEHYDROGENASE: LDH: 112 U/L (ref 98–192)

## 2024-03-26 LAB — PROTIME-INR
INR: 2.8 — ABNORMAL HIGH (ref 0.8–1.2)
Prothrombin Time: 30.5 s — ABNORMAL HIGH (ref 11.4–15.2)

## 2024-03-26 LAB — URIC ACID: Uric Acid, Serum: 7.5 mg/dL (ref 3.7–8.6)

## 2024-03-26 LAB — PREPARE RBC (CROSSMATCH)

## 2024-03-26 MED ORDER — DICLOFENAC SODIUM 1 % EX GEL
2.0000 g | Freq: Four times a day (QID) | CUTANEOUS | Status: DC
  Administered 2024-03-26 – 2024-03-27 (×5): 2 g via TOPICAL
  Filled 2024-03-26 (×2): qty 100

## 2024-03-26 MED ORDER — OCTREOTIDE ACETATE 100 MCG/ML IJ SOLN
100.0000 ug | Freq: Two times a day (BID) | INTRAMUSCULAR | Status: DC
  Administered 2024-03-26 – 2024-04-02 (×15): 100 ug via SUBCUTANEOUS
  Filled 2024-03-26 (×16): qty 1

## 2024-03-26 MED ORDER — ENSURE PLUS HIGH PROTEIN PO LIQD: 237.0000 mL | Freq: Two times a day (BID) | ORAL | Status: AC

## 2024-03-26 MED ORDER — SENNOSIDES-DOCUSATE SODIUM 8.6-50 MG PO TABS
1.0000 | ORAL_TABLET | Freq: Every day | ORAL | Status: DC
  Administered 2024-03-26 – 2024-03-30 (×3): 1 via ORAL
  Filled 2024-03-26 (×5): qty 1

## 2024-03-26 MED ORDER — SODIUM CHLORIDE 0.9% IV SOLUTION: Freq: Once | INTRAVENOUS | Status: AC

## 2024-03-26 MED ORDER — POLYETHYLENE GLYCOL 3350 17 G PO PACK
17.0000 g | PACK | Freq: Every day | ORAL | Status: DC
  Filled 2024-03-26: qty 1

## 2024-03-26 NOTE — Progress Notes (Signed)
 Patient ID: Isaac [name], male   DOB: 08-06-1951, 73 y.o.   MRN: [id number]     Advanced Heart Failure Rounding Note  Cardiologist: Ezra Shuck, MD  Chief Complaint: GIB Subjective:    S/p 2u PRBcs 7/1, 1 unit 7/2, 1 unit 7/3, 1 unit 7/5.  Hgb 7.6 this morning.  Small black BM yesterday.   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    Frustrated with ongoing anemia. INR 2.8 today, has not been getting warfarin.   LVAD Interrogation HM III: Speed: 5300 Flow: 4.2 PI: 3.5 Power: 4.   Objective:   Weight Range: 79.6 kg Body mass index is 25.91 kg/m.   Vital Signs:   Temp:  [97.6 F (36.4 C)-98.6 F (37 C)] 97.9 F (36.6 C) (07/06 0747) Pulse Rate:  [62-66] 62 (07/06 0747) Resp:  [12-24] 12 (07/06 0747) BP: (82-102)/(56-78) 83/56 (07/06 0747) SpO2:  [85 %-100 %] 100 % (07/05 2000) Weight:  [79.6 kg] 79.6 kg (07/06 0500) Last BM Date : 03/25/24  Weight change: Filed Weights   03/22/24 0500 03/23/24 0500 03/26/24 0500  Weight: 79.8 kg 79.4 kg 79.6 kg   Intake/Output:   Intake/Output Summary (Last 24 hours) at 03/26/2024 1025 Last data filed at 03/25/2024 1826 Gross per 24 hour  Intake 975.83 ml  Output 550 ml  Net 425.83 ml    Physical Exam   General: Well appearing this am. NAD.  HEENT: Normal. Neck: Supple, JVP 8 cm. Carotids OK.  Cardiac:  Mechanical heart sounds with LVAD hum present.  Lungs:  CTAB, normal effort.  Abdomen:  NT, ND, no HSM. No bruits or masses. +BS  LVAD exit site: Well-healed and incorporated. Dressing dry and intact. No erythema or drainage. Stabilization device present and accurately applied. Driveline dressing changed daily per sterile technique. Extremities:  Warm and dry. No cyanosis, clubbing, rash, or edema.  Neuro:  Alert & oriented x 3. Cranial nerves grossly intact. Moves all 4 extremities w/o difficulty. Affect pleasant    Telemetry   A-paced 60s (Personally reviewed)    EKG     No new EKG to review  Labs  CBC Recent Labs    03/25/24 0535 03/26/24 0220  WBC 5.7 6.5  HGB 7.8* 7.6*  HCT 24.6* 22.4*  MCV 92.8 90.7  PLT 204 190   Basic Metabolic Panel Recent Labs    92/94/74 0535 03/26/24 0220  NA 139 137  K 4.1 4.5  CL 111 109  CO2 20* 20*  GLUCOSE 98 95  BUN 54* 58*  CREATININE 2.24* 2.31*  CALCIUM  8.5* 8.5*   Liver Function Tests No results for input(s): AST, ALT, ALKPHOS, BILITOT, PROT, ALBUMIN  in the last 72 hours.  No results for input(s): LIPASE, AMYLASE in the last 72 hours. Cardiac Enzymes No results for input(s): CKTOTAL, CKMB, CKMBINDEX, TROPONINI in the last 72 hours.  BNP: BNP (last 3 results) Recent Labs    02/05/24 1106  BNP 343.8*   ProBNP (last 3 results) No results for input(s): PROBNP in the last 8760 hours.  D-Dimer No results for input(s): DDIMER in the last 72 hours. Hemoglobin A1C No results for input(s): HGBA1C in the last 72 hours. Fasting Lipid Panel No results for input(s): CHOL, HDL, LDLCALC, TRIG, CHOLHDL, LDLDIRECT in the last 72 hours. Thyroid  Function Tests No results for input(s): TSH, T4TOTAL, T3FREE, THYROIDAB in the last 72 hours.  Invalid input(s): FREET3  Other results:  Imaging  No results found.  Medications:   Scheduled Medications:  Chlorhexidine  Gluconate Cloth  6 each Topical Daily   feeding supplement  237 mL Oral BID BM   multivitamin with minerals  1 tablet Oral Daily   octreotide   100 mcg Subcutaneous Q12H   pantoprazole   40 mg Oral BID   [START ON 03/27/2024] polyethylene glycol  17 g Oral Daily   rosuvastatin   10 mg Oral QHS   senna-docusate  1 tablet Oral QHS   sildenafil   20 mg Oral TID   Warfarin - Pharmacist Dosing Inpatient   Does not apply q1600    Infusions:   PRN Medications: acetaminophen , ondansetron  (ZOFRAN ) IV Patient Profile   73 y.o. with history of CAD, HTN, atrial fibrillation, and chronic systolic  CHF s/p LVAD HM3. Presenting with GIB.   Assessment/Plan   1. GIB: Patient has epigastric pain, nausea, and melena x 1 week after getting Lovenox  for several days.  INR goal had been increased to 2.5-3 since his CVA. Reports lightheadedness. Hgb 6.4 on admission. Has had 5 units PRBCs so far.  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  Hgb 7.6 today.  Had melena again yesterday.  - Will give 1 unit PRBCs again today.  - Continue protonix  40 mg po bid - INR goal lowered to 2-2.5 => INR 2.8 today, continue to hold warfarin.   - Will start octreotide  100 mcg [place] bid today.  - Recommended monthly octreotide  as outpatient, patient and wife want to think about this.   - If hemoglobin does not stabilize, will need GI to see him again tomorrow. 2. Chronic systolic CHF:  Long-standing cardiomyopathy.  [employer] dual chamber ICD.  Echo in 12/22 with EF < 20%, severe LV dilation, restrictive diastolic function, moderate RV dysfunction, moderate MR, mod-severe TR. Cause of cardiomyopathy is uncertain.  He has a history of CAD, but I do not think that the described CAD from the past could explain his cardiomyopathy, but CAD could have progressed.  He was admitted in 12/22 with cardiogenic shock and Impella 5.5. s/p HM III VAD on 09/17/22.  Post-op course complicated by AKI and RV failure. He was on HD for several months but is now off.  Echo in 7/24 showed EF < 20%, IV septum midline to slightly leftward, RV moderately dilated with moderately decreased systolic function, IVC dilated with severe TR.  Echo in 5/25 with EF 20-25%, mild LVH, LVAD present, mild RV dysfunction, moderate AI, mild RV dysfunction with normal RV size, severe TR.  Stable NYHA class II, MAP 70s and LVAD parameters stable today. Not markedly volume overloaded.  - Lasix  on hold with bleeding.  - Continue sildenafil  20 mg TID - Warfarin with INR goal 2-2.5. 3. Tricuspid regurgitation:   Tricuspid repair not done at time of VAD due to proximity of ICD wires and hypotension during surgery. He has severe TR.  4. Atrial fibrillation: Paroxysmal.  He is off amiodarone  with resolution of nausea. Currently a-paced.  5. CKD stage IV: Suspect intra-op hypotension at the time of LVAD placement led to development of ATN => urine sediment looked like ATN per renal. Had initial CVVH and transitioned over to HD. He is now off HD.  Creatinine mildly higher at 2.3 today.   - Holding Lasix .  6. CAD: History of PCI to OM1 in 2007 and RCA in 2013.  No CP or ACs.  - Deferred cardiac cath in 12/22  due to AKI and plan for VAD - Continue Crestor .   7. Abnormal thyroid  function tests: Patient has had elevated TSH and free T4, mixed pattern. We have stopped amiodarone . 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  TSH normal this admission.  - He follows with endocrinology.   8. Nausea: This seems to have been due to amiodarone , resolved off amiodarone .  Now has recurred, possibly due to upper GI bleeding. None at this time.  9. 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  INR goal increased to 2.5-3 post-CVA, now goal decreased back to 2-2.5.  10. Right knee pain: Noted this morning, says it does not feel like prior gout.  - Check uric acid.   Getting 1 unit PRBCs this morning.   Can go home when CBC stabilized. Mobilize today.    Ezra Shuck 03/26/2024 10:25 AM

## 2024-03-26 NOTE — Progress Notes (Addendum)
 ANTICOAGULATION CONSULT NOTE  Pharmacy Consult for warfarin Indication: HM 3  Allergies  Allergen Reactions   Neosporin [Neomycin-Bacitracin Zn-Polymyx] Hives   Doxycycline  Nausea And Vomiting   Mushroom Extract Complex (Obsolete) Nausea And Vomiting   Tape Other (See Comments)    Some tapes/dressings can irritate the skin    Patient Measurements: Height: 5' 9 (175.3 cm) Weight: 79.6 kg (175 lb 7.8 oz) IBW/kg (Calculated) : 70.7  Vital Signs: Temp: 97.9 F (36.6 C) (07/06 0747) Temp Source: Oral (07/06 0747) BP: 83/56 (07/06 0747) Pulse Rate: 62 (07/06 0747)  Labs: Recent Labs    03/24/24 0426 03/24/24 1113 03/25/24 0535 03/26/24 0220  HGB 8.3* 8.3* 7.8* 7.6*  HCT 25.5* 25.0* 24.6* 22.4*  PLT 193 185 204 190  LABPROT 27.9*  --  35.7* 30.5*  INR 2.5*  --  3.4* 2.8*  CREATININE 2.04*  --  2.24* 2.31*    Estimated Creatinine Clearance: 28.9 mL/min (A) (by C-G formula based on SCr of 2.31 mg/dL (H)).  Medical History: Past Medical History:  Diagnosis Date   Arrhythmia    atrial fibrillation   CHF (congestive heart failure) (HCC)    Chronic kidney disease    Coronary artery disease    Hyperlipidemia    Hypertension    Myocardial infarct [facility])     Medications:  See MAR  Assessment: 73 yo male s/p HM3 LVAD 09/2021 with recent CVA 02/10/24 where INR goal was increased to 2.5-3.  Recently bridged with lovenox  40 mg BID (LVAD protocol) from 6/20-6/24 now presenting with GIB.  Pharmacy consulted for warfarin dosing.  PTA warfarin regimen - 2 mg TuThSa, 4 mg all other days (22 mg/wk) per 6/26 office visit (2 mg tablets) No notable DDI's  Appeared to be therapeutic (INR ranging 2-2.2) on warfarin regimen of 4 mg Mon/Wed/Fri and 2 mg all other days.   INR is supratherapeutic but trending down to 2.8 - last dose warfarin 7/3. Hgb 7.6 despite 1 PRBC 7/5, plt 190. LDH stable at 112. Still with some black stool.   Goal of Therapy:  INR 2-2.5 Monitor platelets by  anticoagulation protocol: Yes   Plan:  Hold warfarin again tonight - plan 1 PRBC + 2 dose of ensures Daily INR, CBC Monitor for s/sx of bleeding  Thank you for allowing pharmacy to participate in this patient's care,  Suzen Sour, PharmD, BCCCP Clinical Pharmacist  Phone: [phone] 03/26/2024 10:03 AM  Please check AMION for all [facility] phone numbers After 10:00 PM, call Main Pharmacy [phone]

## 2024-03-27 ENCOUNTER — Telehealth: Payer: Self-pay | Admitting: Speech Pathology

## 2024-03-27 DIAGNOSIS — K922 Gastrointestinal hemorrhage, unspecified: Secondary | ICD-10-CM | POA: Diagnosis not present

## 2024-03-27 DIAGNOSIS — I5022 Chronic systolic (congestive) heart failure: Secondary | ICD-10-CM | POA: Diagnosis not present

## 2024-03-27 LAB — CBC
HCT: 23.2 % — ABNORMAL LOW (ref 39.0–52.0)
Hemoglobin: 7.5 g/dL — ABNORMAL LOW (ref 13.0–17.0)
MCH: 29.4 pg (ref 26.0–34.0)
MCHC: 32.3 g/dL (ref 30.0–36.0)
MCV: 91 fL (ref 80.0–100.0)
Platelets: 200 K/uL (ref 150–400)
RBC: 2.55 MIL/uL — ABNORMAL LOW (ref 4.22–5.81)
RDW: 16.1 % — ABNORMAL HIGH (ref 11.5–15.5)
WBC: 7.9 K/uL (ref 4.0–10.5)
nRBC: 0 % (ref 0.0–0.2)

## 2024-03-27 LAB — CBC WITH DIFFERENTIAL/PLATELET
Abs Immature Granulocytes: 0.02 K/uL (ref 0.00–0.07)
Basophils Absolute: 0 K/uL (ref 0.0–0.1)
Basophils Relative: 0 %
Eosinophils Absolute: 0.2 K/uL (ref 0.0–0.5)
Eosinophils Relative: 4 %
HCT: 26.1 % — ABNORMAL LOW (ref 39.0–52.0)
Hemoglobin: 8.5 g/dL — ABNORMAL LOW (ref 13.0–17.0)
Immature Granulocytes: 0 %
Lymphocytes Relative: 8 %
Lymphs Abs: 0.5 K/uL — ABNORMAL LOW (ref 0.7–4.0)
MCH: 29 pg (ref 26.0–34.0)
MCHC: 32.6 g/dL (ref 30.0–36.0)
MCV: 89.1 fL (ref 80.0–100.0)
Monocytes Absolute: 0.6 K/uL (ref 0.1–1.0)
Monocytes Relative: 10 %
Neutro Abs: 4.5 K/uL (ref 1.7–7.7)
Neutrophils Relative %: 78 %
Platelets: 196 K/uL (ref 150–400)
RBC: 2.93 MIL/uL — ABNORMAL LOW (ref 4.22–5.81)
RDW: 18.2 % — ABNORMAL HIGH (ref 11.5–15.5)
WBC: 5.8 K/uL (ref 4.0–10.5)
nRBC: 0 % (ref 0.0–0.2)

## 2024-03-27 LAB — BASIC METABOLIC PANEL WITH GFR
Anion gap: 6 (ref 5–15)
BUN: 58 mg/dL — ABNORMAL HIGH (ref 8–23)
CO2: 21 mmol/L — ABNORMAL LOW (ref 22–32)
Calcium: 8.3 mg/dL — ABNORMAL LOW (ref 8.9–10.3)
Chloride: 109 mmol/L (ref 98–111)
Creatinine, Ser: 2.3 mg/dL — ABNORMAL HIGH (ref 0.61–1.24)
GFR, Estimated: 29 mL/min — ABNORMAL LOW (ref >60)
Glucose, Bld: 130 mg/dL — ABNORMAL HIGH (ref 70–99)
Potassium: 5.1 mmol/L (ref 3.5–5.1)
Sodium: 136 mmol/L (ref 135–145)

## 2024-03-27 LAB — LACTATE DEHYDROGENASE: LDH: 110 U/L (ref 98–192)

## 2024-03-27 LAB — PROTIME-INR
INR: 1.8 — ABNORMAL HIGH (ref 0.8–1.2)
Prothrombin Time: 21.9 s — ABNORMAL HIGH (ref 11.4–15.2)

## 2024-03-27 LAB — PREPARE RBC (CROSSMATCH)

## 2024-03-27 MED ORDER — NA SULFATE-K SULFATE-MG SULF 17.5-3.13-1.6 GM/177ML PO SOLN
0.5000 | Freq: Once | ORAL | Status: AC
  Administered 2024-03-28: 177 mL via ORAL

## 2024-03-27 MED ORDER — POLYETHYLENE GLYCOL 3350 17 G PO PACK
17.0000 g | PACK | Freq: Two times a day (BID) | ORAL | Status: DC
  Administered 2024-03-27 – 2024-03-28 (×3): 17 g via ORAL
  Filled 2024-03-27 (×9): qty 1

## 2024-03-27 MED ORDER — SODIUM CHLORIDE 0.9% IV SOLUTION: Freq: Once | INTRAVENOUS | Status: DC

## 2024-03-27 MED ORDER — SIMETHICONE 80 MG PO CHEW
240.0000 mg | CHEWABLE_TABLET | Freq: Once | ORAL | Status: AC
  Administered 2024-03-28: 240 mg via ORAL
  Filled 2024-03-27: qty 3

## 2024-03-27 MED ORDER — NA SULFATE-K SULFATE-MG SULF 17.5-3.13-1.6 GM/177ML PO SOLN
0.5000 | Freq: Once | ORAL | Status: AC
  Administered 2024-03-28: 177 mL via ORAL
  Filled 2024-03-27 (×2): qty 1

## 2024-03-27 NOTE — Progress Notes (Signed)
 LVAD Coordinator Rounding Note:  Pt admitted 03/21/24 for suspected GIB. Pt states he started feeling poorly Sunday noting dizziness and generalized fatigue that has continued to worsen throughout the week. Yesterday morning his wife states he fell at home. Denies head trauma. 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 Pt was subtherapeutic 6/20 and bridged with Lovenox  till 6/24.   Hgb 6.4 on arrival pt received 2 PRBC. CT chest/abdomen/pelvis obtained overnight.GI consulted. Awaiting recommendations.   IMPRESSION: 1. No acute noncontrast CT abnormality of the chest, abdomen, or pelvis. No specific noncontrast CT findings to explain GI bleed. 2. Moderate burden of stool throughout the colon and rectum. 3. Emphysema and diffuse bilateral bronchial wall thickening. 4. Cardiomegaly and coronary artery disease. 5. Cholelithiasis. 6. Prostatomegaly.  Pt lying in bed this morning. Hgb 7.5. Enteroscopy performed 03/22/24 . 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 GI recommends if bleeding continues pt may need a colonoscopy. Pt receiving SQ Octreotide . Will set outpatient injections upon discharge.  Per GI team pt will need EGD and colonoscopy on Wednesday 03/29/24. Pt is frustrated that he his hgb is still low.  Vital signs: Temp: 98.1 HR: 63 Doppler Pressure: 82 Auto Cuff BP: 85/62 (69) O2 Sat: 95% on RA Wt: 175.9>175.1>170.6 lbs    LVAD interrogation reveals:  Speed: 5300 Flow: 4.4 Power: 3.7w PI: 3.5 Hematocrit: 23  Alarms: none Events: rare  Fixed speed: 5300 Low speed limit: 5000  Pt has hospital loaner back up back on VAD cart.   Drive Line:  Existing VAD dressing C/D/I with anchor intact and accurately applied. Weekly dressing changes per VAD coordinator or pts wife. Next dressing change due: 03/31/24 by caregiver. Weekly kit on VAD cart.   Labs:  Hgb  trend: 6.4>7.4>8.1>7.9>9.1>8.3>7.5  LDH trend: 124>102>107>119>110  INR trend: 2.2>2.3>2.2>2.5>1.8  Anticoagulation Plan: -INR Goal: 2-2.5 lowered this admission d/t GIB  Blood Products: 03/21/24: 2 PRBC 03/22/24: 1 PRBC 03/23/24: 1 PRBC 03/26/24: 1 PRBC 03/27/24: 1 PRBC  Device: - Cabin crew - V paced 60 -Therapies: OFF  Infection:   Plan/Recommendations:  Page VAD coordinator for drive line or equipment concerns 2. Weekly dressing changes per VAD Coordinator or caregiver.   Lauraine Ip RN, BSN VAD Coordinator 24/7 Pager [phone]

## 2024-03-27 NOTE — Progress Notes (Addendum)
 Patient ID: Isaac [name], male   DOB: [birth year], 73 y.o.   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    S/p 2u PRBcs 7/1, 1 unit 7/2, 1 unit 7/3, 1 unit 7/5, 7/6.  Hgb 7.5 this morning.  Dark stool this morning.   Have been holding warfarin. INR 1.8  Feels ok this morning. Has been getting up and walking in the halls. Denies dizziness.   LVAD Interrogation HM III: Speed: 5300 Flow: 4.3 PI: 3.5 Power: 3.8. x3 PI events today  Objective:   Weight Range: 77.4 kg Body mass index is 25.2 kg/m.   Vital Signs:   Temp:  [97.6 F (36.4 C)-98.5 F (36.9 C)] 97.6 F (36.4 C) (07/07 0741) Pulse Rate:  [59-65] 59 (07/07 0741) Resp:  [14-19] 16 (07/07 0741) BP: (83-97)/(65-85) 85/65 (07/07 0741) SpO2:  [97 %-100 %] 97 % (07/07 0741) Weight:  [77.4 kg] 77.4 kg (07/07 0629) Last BM Date : 03/25/24  Weight change: Filed Weights   03/23/24 0500 03/26/24 0500 03/27/24 0629  Weight: 79.4 kg 79.6 kg 77.4 kg   Intake/Output:   Intake/Output Summary (Last 24 hours) at 03/27/2024 0844 Last data filed at 03/26/2024 2157 Gross per 24 hour  Intake 894 ml  Output 275 ml  Net 619 ml    Physical Exam  General:  Well appearing. No resp difficulty Neck: supple. JVP ~8.  Cor: Mechanical heart sounds with LVAD hum present. Lungs: Clear Driveline: C/D/I; securement device intact and driveline incorporated Extremities: no cyanosis, clubbing, rash, edema Neuro: alert & oriented x3. Moves all 4 extremities w/o difficulty. Affect pleasant   Telemetry   A-paced 60s (Personally reviewed)    EKG    No new EKG to review  Labs  CBC Recent Labs    03/26/24 0220 03/27/24 0212  WBC 6.5 7.9  HGB 7.6* 7.5*  HCT 22.4* 23.2*  MCV 90.7 91.0  PLT 190 200   Basic Metabolic Panel Recent Labs    92/93/74 0220  03/27/24 0212  NA 137 136  K 4.5 5.1  CL 109 109  CO2 20* 21*  GLUCOSE 95 130*  BUN 58* 58*  CREATININE 2.31* 2.30*  CALCIUM  8.5* 8.3*   Liver Function Tests No results for input(s): AST, ALT, ALKPHOS, BILITOT, PROT, ALBUMIN  in the last 72 hours.  No results for input(s): LIPASE, AMYLASE in the last 72 hours. Cardiac Enzymes No results for input(s): CKTOTAL, CKMB, CKMBINDEX, TROPONINI in the last 72 hours.  BNP: BNP (last 3 results) Recent Labs    02/05/24 1106  BNP 343.8*   ProBNP (last 3 results) No results for input(s): PROBNP in the last 8760 hours.  D-Dimer No results for input(s): DDIMER in the last 72 hours. Hemoglobin A1C No results for input(s): HGBA1C in the last 72 hours. Fasting Lipid Panel No results for input(s): CHOL, HDL, LDLCALC, TRIG, CHOLHDL, LDLDIRECT in the last 72 hours. Thyroid  Function Tests No results for input(s): TSH, T4TOTAL, T3FREE, THYROIDAB in the last 72 hours.  Invalid input(s): FREET3  Other results:  Imaging  No results found.  Medications:   Scheduled Medications:  Chlorhexidine  Gluconate Cloth  6 each Topical Daily   diclofenac  Sodium  2 g Topical QID   feeding supplement  237 mL Oral BID BM   multivitamin with minerals  1 tablet Oral Daily   octreotide   100 mcg Subcutaneous Q12H   pantoprazole   40 mg Oral BID   polyethylene glycol  17 g Oral Daily   rosuvastatin   10 mg Oral QHS   senna-docusate  1 tablet Oral QHS   sildenafil   20 mg Oral TID   Warfarin - Pharmacist Dosing Inpatient   Does not apply q1600    Infusions:   PRN Medications: acetaminophen , ondansetron  (ZOFRAN ) IV Patient Profile   73 y.o. with history of CAD, HTN, atrial fibrillation, and chronic systolic CHF s/p LVAD HM3. Presenting with GIB.   Assessment/Plan  1. GIB: Patient has epigastric pain, nausea, and melena x 1 week after getting Lovenox  for several days.  INR goal had been increased to  2.5-3 since his CVA. Reports lightheadedness. Hgb 6.4 on admission. Has had 6 units PRBCs so far.  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   - Hgb 7.5 today.  Had melena again yesterday and dark stool this morning.  - Will give 1 unit PRBCs again today.  - Continue protonix  40 mg po bid - INR goal lowered to 2-2.5 => Have been holding warfarin. INR 1.8 today - Continue octreotide  100 mcg [place] bid.  - Recommended monthly octreotide  as outpatient, patient and wife want to think about this.   - Hgb continues to drop despite getting PRBCs and ongoing melena. Will ask GI to see again today.  2. Chronic systolic CHF:  Long-standing cardiomyopathy.  [employer] dual chamber ICD.  Echo in 12/22 with EF < 20%, severe LV dilation, restrictive diastolic function, moderate RV dysfunction, moderate MR, mod-severe TR. Cause of cardiomyopathy is uncertain.  He has a history of CAD, but I do not think that the described CAD from the past could explain his cardiomyopathy, but CAD could have progressed.  He was admitted in 12/22 with cardiogenic shock and Impella 5.5. s/p HM III VAD on 09/17/22.  Post-op course complicated by AKI and RV failure. He was on HD for several months but is now off.  Echo in 7/24 showed EF < 20%, IV septum midline to slightly leftward, RV moderately dilated with moderately decreased systolic function, IVC dilated with severe TR.  Echo in 5/25 with EF 20-25%, mild LVH, LVAD present, mild RV dysfunction, moderate AI, mild RV dysfunction with normal RV size, severe TR.  Stable NYHA class II, MAP 70s and LVAD parameters stable today. Not markedly volume overloaded.  - Lasix  on hold with bleeding.  - Continue sildenafil  20 mg TID - Warfarin with INR goal 2-2.5. Holding with ongoing bleeding.  3. Tricuspid regurgitation:  Tricuspid repair not done at time of VAD due to proximity of ICD wires and hypotension during surgery. He has severe TR.  4.  Atrial fibrillation: Paroxysmal.  He is off amiodarone  with resolution of nausea. Currently a-paced.  5. CKD stage IV: Suspect intra-op hypotension at the time of LVAD placement led to development of ATN => urine sediment looked like ATN per renal. Had initial CVVH and transitioned over to HD. He is now off HD.  Creatinine 2.3 today.   - Holding Lasix .  6. CAD: History of PCI to OM1 in 2007 and RCA in 2013.  No CP or ACs.  - Deferred cardiac cath in 12/22 due to AKI and plan for VAD - Continue Crestor .   7. Abnormal thyroid  function tests: Patient has had elevated TSH and free T4, mixed pattern. We  have stopped amiodarone . 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  TSH normal this admission.  - He follows with endocrinology.   8. Nausea: This seems to have been due to amiodarone , resolved off amiodarone .  Now has recurred, possibly due to upper GI bleeding.  Resolved 9. 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  INR goal increased to 2.5-3 post-CVA, now goal decreased back to 2-2.5.  10. Right knee pain:  - Uric acid 7.5.  - appears to be pre-patellar bursitis, will follow for now  I reviewed the LVAD parameters from today, and compared the results to the patient's prior recorded data.  No programming changes were made.  The LVAD is functioning within specified parameters.  The patient performs LVAD self-test daily.  LVAD interrogation was negative for any significant power changes, alarms or PI events/speed drops.  LVAD equipment check completed and is in good working order.  Back-up equipment present.   LVAD education done on emergency procedures and precautions and reviewed exit site care.   Beckey LITTIE Coe AGACNP-BC  03/27/2024 8:44 AM  VAD Team Pager [phone] (7am - 7am)   Patient seen and examined with the above-signed Advanced Practice Provider and/or Housestaff. I personally reviewed laboratory data,  imaging studies and relevant notes. I independently examined the patient and formulated the important aspects of the plan. I have edited the note to reflect any of my changes or salient points. I have personally discussed the plan with the patient and/or family.  Still with dark stools. Denies ab pain. HGb down slightly. C/o R knee pain and stiffness. Uric acid ok.  General:  NAD.  HEENT: normal  Neck: supple. JVP not elevated.  Carotids 2+ bilat; no bruits. No lymphadenopathy or thryomegaly appreciated. Cor: LVAD hum.  Lungs: Clear. Abdomen: obese soft, nontender, non-distended. No hepatosplenomegaly. No bruits or masses. Good bowel sounds. Driveline site clean. Anchor in place.  Extremities: no cyanosis, clubbing, rash. Warm no edema  R knee with prepatellar swelling and tenderness Neuro: alert & oriented x 3. No focal deficits. Moves all 4 without problem   Still seems to have some slow GI bleeding. D/w GI. Plan repeat EGD + colon on Wednesday. Will transfuse 1uRBCs today. Continue PPI. Hold off on [facility] for now.   Volume status ok. VAD interrogated personally. Parameters stable.  Suspect he has pre-patellar bursitis on exam. Can use ICE. Unable to use NSAIDS with warfarin and GIB. PT/OT to see.   Toribio Fuel, MD  2:39 PM

## 2024-03-27 NOTE — Care Management Important Message (Signed)
 Important Message  Patient Details  Name: Regnald Bowens MRN: [id number] Date of Birth: April 24, 1951   Important Message Given:  Yes - Medicare IM     Claretta Deed 03/27/2024, 3:05 PM

## 2024-03-27 NOTE — Telephone Encounter (Signed)
 I just spoke with Isaac [name] and let her know we got him cancelled this week.. she has been in contact with his PCP and as soon as they discharge, they are sending an order to resume his therapy.

## 2024-03-27 NOTE — Evaluation (Signed)
 Physical Therapy Evaluation Patient Details Name: Isaac [name] MRN: [id number] DOB: 1951-06-01 Today's Date: 03/27/2024  History of Present Illness  Pt admitted 03/21/24 for suspected GIB. Wife states he fell at home. 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 Pt was subtherapeutic 6/20 and bridged with Lovenox  till 6/24. Pt also with right knee pain.  PMH:  CHF  Clinical Impression  Pt admitted with above diagnosis. Pt was able to ambulate a short distance with min to CGA.  Pt has supportive family at home and should progress well. Pt able to switch to batteries and back and manipulate LVAD equipment without cues. Pt limited by right knee pain as knee is red and swollen. Pt receiving blood as well. Will follow acutely.   Pt currently with functional limitations due to the deficits listed below (see PT Problem List). Pt will benefit from acute skilled PT to increase their independence and safety with mobility to allow discharge.           If plan is discharge home, recommend the following: A little help with walking and/or transfers;A lot of help with bathing/dressing/bathroom;Assist for transportation;Help with stairs or ramp for entrance;Direct supervision/assist for medications management   Can travel by private vehicle        Equipment Recommendations None recommended by PT  Recommendations for Other Services       Functional Status Assessment Patient has had a recent decline in their functional status and demonstrates the ability to make significant improvements in function in a reasonable and predictable amount of time.     Precautions / Restrictions Precautions Precautions: Fall Precaution/Restrictions Comments: LVAD, R neglect Restrictions Weight Bearing Restrictions Per Provider Order: No      Mobility  Bed Mobility Overal bed mobility: Needs Assistance Bed Mobility: Supine to Sit     Supine to sit:  Supervision          Transfers Overall transfer level: Needs assistance Equipment used: Rolling walker (2 wheels) Transfers: Sit to/from Stand, Bed to chair/wheelchair/BSC Sit to Stand: Min assist           General transfer comment: max directional verbal cues to power up and a little assist as pt has pain in right knee. Once up, pt able to get his balance.    Ambulation/Gait Ambulation/Gait assistance: Contact guard assist, Min assist Gait Distance (Feet): 15 Feet Assistive device: None Gait Pattern/deviations: Step-to pattern, Decreased stride length, Decreased dorsiflexion - right, Decreased weight shift to right, Antalgic, Narrow base of support Gait velocity: dec Gait velocity interpretation: <1.31 ft/sec, indicative of household ambulator   General Gait Details: Pt able to walk around the bed to the chair with CGA/min assist with pt holding onto end of bed and needing intermittent UE support. Room so small made it difficult to use device.  Stairs            Wheelchair Mobility     Tilt Bed    Modified Rankin (Stroke Patients Only)       Balance Overall balance assessment: Needs assistance Sitting-balance support: Feet supported, No upper extremity supported, Bilateral upper extremity supported Sitting balance-Leahy Scale: Fair     Standing balance support: Reliant on assistive device for balance, During functional activity, Bilateral upper extremity supported Standing balance-Leahy Scale: Poor Standing balance comment: reliant on external support  Pertinent Vitals/Pain Pain Assessment Pain Assessment: Faces Faces Pain Scale: Hurts even more Pain Location: right knee Pain Descriptors / Indicators: Aching, Discomfort, Grimacing, Guarding Pain Intervention(s): Limited activity within patient's tolerance, Monitored during session, Repositioned    Home Living Family/patient expects to be discharged to:: Private  residence Living Arrangements: Spouse/significant other Available Help at Discharge: Family Type of Home: House Home Access: Stairs to enter   [employer] of Steps: 1 door threshold vs step Alternate Level Stairs-Number of Steps: ~6, a landing, then additional ~6 Home Layout: Two level;Bed/bath upstairs;1/2 bath on main level Home Equipment: Rollator (4 wheels);BSC/3in1 Additional Comments: Has rollator and 2 SPC    Prior Function Prior Level of Function : Independent/Modified Independent;Driving             Mobility Comments: no AD use ADLs Comments: ind     Extremity/Trunk Assessment   Upper Extremity Assessment Upper Extremity Assessment: Defer to OT evaluation    Lower Extremity Assessment RLE Deficits / Details: grossly 3-/5 impaired sensation and repsonse to noxious stimuli    Cervical / Trunk Assessment Cervical / Trunk Assessment: Normal  Communication   Communication Communication: No apparent difficulties    Cognition Arousal: Alert Behavior During Therapy: WFL for tasks assessed/performed   PT - Cognitive impairments: Sequencing, Problem solving                       PT - Cognition Comments: R sided inattention, word finding difficulty, impaired R sided proprioception. impaired squencing requiring verbal cues to complete transfer of power source from wall to batteries requiring significant increase in time Following commands: Impaired Following commands impaired: Only follows one step commands consistently     Cueing Cueing Techniques: Verbal cues     General Comments      Exercises General Exercises - Lower Extremity Ankle Circles/Pumps: AROM, Both, 5 reps, Supine Long Arc Quad: AROM, Both, Seated, 10 reps   Assessment/Plan    PT Assessment Patient needs continued PT services  PT Problem List Decreased strength;Decreased range of motion;Decreased activity tolerance;Decreased balance;Decreased mobility;Decreased  coordination;Decreased knowledge of use of DME;Decreased safety awareness       PT Treatment Interventions DME instruction;Gait training;Stair training;Functional mobility training;Therapeutic activities;Therapeutic exercise;Balance training;Neuromuscular re-education    PT Goals (Current goals can be found in the Care Plan section)  Acute Rehab PT Goals Patient Stated Goal: didn't state PT Goal Formulation: With patient/family Time For Goal Achievement: 04/10/24 Potential to Achieve Goals: Good    Frequency Min 2X/week     Co-evaluation               AM-PAC PT 6 Clicks Mobility  Outcome Measure Help needed turning from your back to your side while in a flat bed without using bedrails?: A Little Help needed moving from lying on your back to sitting on the side of a flat bed without using bedrails?: A Little Help needed moving to and from a bed to a chair (including a wheelchair)?: A Little Help needed standing up from a chair using your arms (e.g., wheelchair or bedside chair)?: A Little Help needed to walk in hospital room?: A Little Help needed climbing 3-5 steps with a railing? : A Lot 6 Click Score: 17    End of Session Equipment Utilized During Treatment: Gait belt Activity Tolerance: Patient tolerated treatment well Patient left: in chair;with call bell/phone within reach;with chair alarm set;with family/visitor present Nurse Communication: Mobility status PT Visit Diagnosis: Unsteadiness on feet (R26.81)  Time: 8942-8878 PT Time Calculation (min) (ACUTE ONLY): 24 min   Charges:   PT Evaluation $PT Eval Moderate Complexity: 1 Mod PT Treatments $Gait Training: 8-22 mins PT General Charges $$ ACUTE PT VISIT: 1 Visit         Janki Dike M,PT Acute Rehab Services [phone]   Stephane JULIANNA Bevel 03/27/2024, 3:44 PM

## 2024-03-27 NOTE — Progress Notes (Signed)
 ANTICOAGULATION CONSULT NOTE  Pharmacy Consult for warfarin Indication: HM 3  Allergies  Allergen Reactions   Neosporin [Neomycin-Bacitracin Zn-Polymyx] Hives   Doxycycline  Nausea And Vomiting   Mushroom Extract Complex (Obsolete) Nausea And Vomiting   Tape Other (See Comments)    Some tapes/dressings can irritate the skin    Patient Measurements: Height: 5' 9 (175.3 cm) Weight: 77.4 kg (170 lb 10.2 oz) IBW/kg (Calculated) : 70.7  Vital Signs: Temp: 97.6 F (36.4 C) (07/07 0741) Temp Source: Oral (07/07 0741) BP: 85/65 (07/07 0741) Pulse Rate: 59 (07/07 0741)  Labs: Recent Labs    03/25/24 0535 03/26/24 0220 03/27/24 0212  HGB 7.8* 7.6* 7.5*  HCT 24.6* 22.4* 23.2*  PLT 204 190 200  LABPROT 35.7* 30.5* 21.9*  INR 3.4* 2.8* 1.8*  CREATININE 2.24* 2.31* 2.30*    Estimated Creatinine Clearance: 29 mL/min (A) (by C-G formula based on SCr of 2.3 mg/dL (H)).  Medical History: Past Medical History:  Diagnosis Date   Arrhythmia    atrial fibrillation   CHF (congestive heart failure) (HCC)    Chronic kidney disease    Coronary artery disease    Hyperlipidemia    Hypertension    Myocardial infarct [facility])     Medications:  See MAR  Assessment: 73 yo male s/p HM3 LVAD 09/2021 with recent CVA 02/10/24 where INR goal was increased to 2.5-3.  Recently bridged with lovenox  40 mg BID (LVAD protocol) from 6/20-6/24 now presenting with GIB.  Pharmacy consulted for warfarin dosing.  PTA warfarin regimen - 2 mg TuThSa, 4 mg all other days (22 mg/wk) per 6/26 office visit (2 mg tablets) No notable DDI's  Appeared to be therapeutic (INR ranging 2-2.2) on warfarin regimen of 4 mg Mon/Wed/Fri and 2 mg all other days.   INR 1.8 dropped significantly (from 2.8) - last dose warfarin 7/3.  Two Ensures ordered yesterday but refused both.  Hgb 7.5 despite 1 PRBC 7/5 and 7/6 - getting another unit today, plt 200. LDH stable at 110.  Still with some black stool - not tarry today.  Goal  of Therapy:  INR 2-2.5 Monitor platelets by anticoagulation protocol: Yes   Plan:  Hold warfarin again tonight after discussion with HF MD - plan 1 PRBC, GI to see for possible colonoscopy Daily INR, CBC Monitor for s/sx of bleeding  Thank you for allowing pharmacy to participate in this patient's care,  Maurilio Fila, PharmD Clinical Pharmacist 03/27/2024  11:57 AM

## 2024-03-27 NOTE — Progress Notes (Addendum)
 Daily Progress Note  DOA: 03/21/2024 Hospital Day: 7   Cc:   persistent anemia  Brief History:  73 y.o. year old male with a medical history including but not limited to CAD, AFIB, moderate MR, mod-severe TR, chronic systolic CHF s/p LVAD, CKD 3, CVA. Patient admitted 7/1 with black tarry stools on Lovenox  ( overlapping with warfarin / epigastric discomfort. See GI consult 7/2  ASSESSMENT    Anemia  / black stools ( reason for admission) Non-bleeding gastric / duodenal / jejunal angiodysplastic lesions s/p APC on 7/2 Hgb 6.4 on admission ( down from baseline ~ 12) S/p total of 6 units of PRBCs this admission.  Today: Hgb 7.5 ( stable since last unit of PRBCs on 7/6 around [place]). Another unit of RBCs has been ordered. Patient doesn't think he had a BM yesterday or last night but just had a formed very dark BM which I saw in toilet. The toilet water  was blood tinged. Not sure if this is ongoing blood loss or residual.  INR 1.8 ( appears warfarin is on hold)  Chronic systolic CHF, LVEF < 20  LVAD  Recent CVA in setting of AFIB.  Warfarin on hold  Principal Problem:   Upper GI bleed Active Problems:   Melena   Acute post-hemorrhagic anemia   Anticoagulated   AVM (arteriovenous malformation) of small bowel, acquired with hemorrhage   Gastric AVM   PLAN   --Continue Octreotide  Q 12 hours --Continue BID PPI --Monitor H/H, about to get another unit or RBCs.  --He is at increased risk for colonoscopy. Will discuss with Dr. Legrand   Subjective   Had formed very dark BM this am. No abdominal pain today. About to get another unit of RBCs. Feels okay,  Objective   GI Studies:  03/22/24 Small bowel endoscopy - Z-line regular. - Normal esophagus otherwise. - A single non-bleeding angiodysplastic lesion in the stomach. Treated with argon plasma coagulation (APC). - Normal stomach otherwise. - Two non-bleeding angiodysplastic lesions in the duodenum. Treated with argon plasma  coagulation (APC). - Patchy friable duodenal mucosa with some red heme in the area. Treated with argon plasma coagulation (APC). - A single non-bleeding angiodysplastic lesion in the jejunum. Treated with argon plasma coagulation (APC). - Normal jejunum otherwise. Small amount of red heme in the 2nd portion duodenum near friable erythematous mucosa which was patchy, as well as 2 AVMs. Other AVMs in jejunum and stomach otherwise ablated.  Recent Labs    03/25/24 0535 03/26/24 0220 03/27/24 0212  WBC 5.7 6.5 7.9  HGB 7.8* 7.6* 7.5*  HCT 24.6* 22.4* 23.2*  MCV 92.8 90.7 91.0  PLT 204 190 200   No results for input(s): FOLATE, VITAMINB12, FERRITIN, TIBC, IRONPCTSAT in the last 72 hours. Recent Labs    03/25/24 0535 03/26/24 0220 03/27/24 0212  NA 139 137 136  K 4.1 4.5 5.1  CL 111 109 109  CO2 20* 20* 21*  GLUCOSE 98 95 130*  BUN 54* 58* 58*  CREATININE 2.24* 2.31* 2.30*  CALCIUM  8.5* 8.5* 8.3*    Imaging:  CT CHEST ABDOMEN PELVIS WO CONTRAST CLINICAL DATA:  Concern for GI bleed * Tracking Code: BO *  EXAM: CT CHEST, ABDOMEN AND PELVIS WITHOUT CONTRAST  TECHNIQUE: Multidetector CT imaging of the chest, abdomen and pelvis was performed following the standard protocol without IV contrast.  RADIATION DOSE REDUCTION: This exam was performed according to the departmental dose-optimization program which includes automated exposure control, adjustment of the  mA and/or kV according to patient size and/or use of iterative reconstruction technique.  COMPARISON:  09/09/2021  FINDINGS: CT CHEST FINDINGS  Cardiovascular: Aortic atherosclerosis. Cardiomegaly. Three-vessel coronary artery calcifications. LVAD. No pericardial effusion.  Mediastinum/Nodes: No enlarged mediastinal, hilar, or axillary lymph nodes. Thyroid  gland, trachea, and esophagus demonstrate no significant findings.  Lungs/Pleura: Moderate centrilobular emphysema. Diffuse bilateral bronchial wall  thickening. No pleural effusion or pneumothorax.  Musculoskeletal: No chest wall abnormality. No acute osseous findings.  CT ABDOMEN PELVIS FINDINGS  Hepatobiliary: No solid liver abnormality is seen. Gallstones. No gallbladder wall thickening, or biliary dilatation.  Pancreas: Unremarkable. No pancreatic ductal dilatation or surrounding inflammatory changes.  Spleen: Normal in size without significant abnormality.  Adrenals/Urinary Tract: Unchanged, definitively benign small bilateral adrenal adenomata, for which no further follow-up or characterization is required. Kidneys are normal, without renal calculi, solid lesion, or hydronephrosis. Bladder is unremarkable.  Stomach/Bowel: Stomach is within normal limits. Appendix appears normal. No evidence of bowel wall thickening, distention, or inflammatory changes. Moderate burden of stool throughout the colon and rectum.  Vascular/Lymphatic: Aortic atherosclerosis. No enlarged abdominal or pelvic lymph nodes.  Reproductive: Prostatomegaly.  Other: Fat containing left inguinal hernia.  No ascites.  Musculoskeletal: No acute osseous findings.  IMPRESSION: 1. No acute noncontrast CT abnormality of the chest, abdomen, or pelvis. No specific noncontrast CT findings to explain GI bleed. 2. Moderate burden of stool throughout the colon and rectum. 3. Emphysema and diffuse bilateral bronchial wall thickening. 4. Cardiomegaly and coronary artery disease. 5. Cholelithiasis. 6. Prostatomegaly.  Aortic Atherosclerosis (ICD10-I70.0) and Emphysema (ICD10-J43.9).  Electronically Signed   By: Marolyn JONETTA Jaksch M.D.   On: 03/21/2024 21:51 DG CHEST PORT 1 VIEW CLINICAL DATA:  Heart failure.  LVAD.  EXAM: PORTABLE CHEST 1 VIEW  COMPARISON:  Chest x-ray 02/05/2024  FINDINGS: Sternal wires again identified. Left upper chest battery pack with defibrillator leads along the heart. Is also a left ventricular assist device overlying the  apex of heart on the left side. Heart itself is enlarged with some vascular congestion. No pneumothorax or effusion. No consolidation. Overlapping cardiac leads. Surgical changes along the right upper chest.  IMPRESSION: Postop chest with enlarged heart and vascular congestion. Defibrillator and left ventricular assist device.  Electronically Signed   By: Ranell Bring M.D.   On: 03/21/2024 17:21     Scheduled inpatient medications:   sodium chloride    Intravenous Once   Chlorhexidine  Gluconate Cloth  6 each Topical Daily   diclofenac  Sodium  2 g Topical QID   feeding supplement  237 mL Oral BID BM   multivitamin with minerals  1 tablet Oral Daily   octreotide   100 mcg Subcutaneous Q12H   pantoprazole   40 mg Oral BID   polyethylene glycol  17 g Oral Daily   rosuvastatin   10 mg Oral QHS   senna-docusate  1 tablet Oral QHS   sildenafil   20 mg Oral TID   Warfarin - Pharmacist Dosing Inpatient   Does not apply q1600   Continuous inpatient infusions:  PRN inpatient medications: acetaminophen , ondansetron  (ZOFRAN ) IV  Vital signs in last 24 hours: Temp:  [97.6 F (36.4 C)-98.5 F (36.9 C)] 97.6 F (36.4 C) (07/07 0741) Pulse Rate:  [59-65] 59 (07/07 0741) Resp:  [14-19] 16 (07/07 0741) BP: (83-97)/(65-85) 85/65 (07/07 0741) SpO2:  [97 %-100 %] 97 % (07/07 0741) Weight:  [77.4 kg] 77.4 kg (07/07 0629) Last BM Date : 03/25/24  Intake/Output Summary (Last 24 hours) at 03/27/2024 0946 Last data filed  at 03/26/2024 2157 Gross per 24 hour  Intake 894 ml  Output 275 ml  Net 619 ml    Intake/Output from previous day: 07/06 0701 - 07/07 0700 In: 1134 [P.O.:820; Blood:314] Out: 275 [Urine:275] Intake/Output this shift: No intake/output data recorded.   Physical Exam:  General: Alert male in NAD Heart:  LVAD hum.  Pulmonary: Normal respiratory effort Abdomen: Soft, nondistended, nontender. Normal bowel sounds. Neurologic: Alert and oriented Psych: Pleasant.  Cooperative     LOS: 6 days   Vina Dasen ,NP 03/27/2024, 9:46 AM   I have taken an interval history, thoroughly reviewed the chart and examined the patient. I agree with the Advanced Practitioner's note, impression and recommendations, and have recorded additional findings, impressions and recommendations below. I performed a substantive portion of this encounter (>50% time spent), including a complete performance of the medical decision making.  My additional thoughts are as follows:  He appears to have ongoing GI blood loss with hemoglobin that will not improve well with transfusions.  INR remains 1.8 with warfarin on hold.  Patient cannot be off OAC for long due to risk of recurrent CVA.  Unclear at this juncture if his ongoing blood loss may be from 1 or more of the following:  APC related ulcerations in the upper GI tract from the recent enteroscopy More distal small bowel AVMs or other source Colonic source  He needs both an EGD and colonoscopy, and preferably they would be done during the same session in a patient who requires a high degree of cardiology and anesthesia monitoring because of his cardiac condition He has had 2 solid food meals today and we want to make sure he has an adequate bowel preparation.  Therefore, my recommendation is for EGD and colonoscopy the day after tomorrow (03/29/2024) to give sufficient time for adequate bowel prep as well as coordination of care with multiple services.  Fortunately he is not briskly bleeding and he is stable at present.  Continue transfusion of PRBCs as needed  All described and discussed with patient he was agreeable.  Patient at increased risk for cardiopulmonary complications of procedure due to medical comorbidities.    Victory LITTIE Brand III Office:[phone]

## 2024-03-28 ENCOUNTER — Ambulatory Visit: Admitting: Speech Pathology

## 2024-03-28 ENCOUNTER — Ambulatory Visit: Admitting: Physical Therapy

## 2024-03-28 ENCOUNTER — Encounter: Admitting: Occupational Therapy

## 2024-03-28 DIAGNOSIS — K922 Gastrointestinal hemorrhage, unspecified: Secondary | ICD-10-CM | POA: Diagnosis not present

## 2024-03-28 DIAGNOSIS — I5022 Chronic systolic (congestive) heart failure: Secondary | ICD-10-CM | POA: Diagnosis not present

## 2024-03-28 LAB — TYPE AND SCREEN
ABO/RH(D): O POS
Antibody Screen: NEGATIVE
Unit division: 0
Unit division: 0
Unit division: 0

## 2024-03-28 LAB — BPAM RBC
Blood Product Expiration Date: 202507132359
Blood Product Expiration Date: 202508062359
Blood Product Expiration Date: 202508062359
ISSUE DATE / TIME: 202507071019
ISSUE DATE / TIME: 202507051251
ISSUE DATE / TIME: 202507061045
Unit Type and Rh: 5100
Unit Type and Rh: 5100
Unit Type and Rh: 5100

## 2024-03-28 LAB — CBC
HCT: 25.3 % — ABNORMAL LOW (ref 39.0–52.0)
Hemoglobin: 8.2 g/dL — ABNORMAL LOW (ref 13.0–17.0)
MCH: 28.7 pg (ref 26.0–34.0)
MCHC: 32.4 g/dL (ref 30.0–36.0)
MCV: 88.5 fL (ref 80.0–100.0)
Platelets: 209 K/uL (ref 150–400)
RBC: 2.86 MIL/uL — ABNORMAL LOW (ref 4.22–5.81)
RDW: 18.2 % — ABNORMAL HIGH (ref 11.5–15.5)
WBC: 4.4 K/uL (ref 4.0–10.5)
nRBC: 0 % (ref 0.0–0.2)

## 2024-03-28 LAB — PROTIME-INR
INR: 1.6 — ABNORMAL HIGH (ref 0.8–1.2)
Prothrombin Time: 19.8 s — ABNORMAL HIGH (ref 11.4–15.2)

## 2024-03-28 LAB — BASIC METABOLIC PANEL WITH GFR
Anion gap: 8 (ref 5–15)
BUN: 58 mg/dL — ABNORMAL HIGH (ref 8–23)
CO2: 21 mmol/L — ABNORMAL LOW (ref 22–32)
Calcium: 8.3 mg/dL — ABNORMAL LOW (ref 8.9–10.3)
Chloride: 107 mmol/L (ref 98–111)
Creatinine, Ser: 2.35 mg/dL — ABNORMAL HIGH (ref 0.61–1.24)
GFR, Estimated: 29 mL/min — ABNORMAL LOW (ref >60)
Glucose, Bld: 102 mg/dL — ABNORMAL HIGH (ref 70–99)
Potassium: 4.7 mmol/L (ref 3.5–5.1)
Sodium: 136 mmol/L (ref 135–145)

## 2024-03-28 LAB — LACTATE DEHYDROGENASE: LDH: 133 U/L (ref 98–192)

## 2024-03-28 NOTE — Anesthesia Preprocedure Evaluation (Signed)
 Anesthesia Evaluation  Patient identified by MRN, date of birth, ID band Patient awake    Reviewed: Allergy & Precautions, NPO status , Patient's Chart, lab work & pertinent test results  History of Anesthesia Complications Negative for: history of anesthetic complications  Airway Mallampati: II  TM Distance: >3 FB Neck ROM: Full   Comment: Previous grade I view with Miller 3, easy mask Dental  (+) Edentulous Lower, Dental Advisory Given,    Pulmonary neg shortness of breath, neg sleep apnea, neg COPD, neg recent URI, former smoker   Pulmonary exam normal breath sounds clear to auscultation       Cardiovascular hypertension, (-) angina + CAD, + Past MI and +CHF (EF 20-25%, LVAD)  + dysrhythmias (LBBB) Atrial Fibrillation and Ventricular Tachycardia + Cardiac Defibrillator + Valvular Problems/Murmurs (moderate AI, severe TR)  Rhythm:Regular Rate:Normal  LVAD, HLD  TTE 02/06/2024: IMPRESSIONS    1. LVAD at apex. Left ventricular ejection fraction, by estimation, is 20  to 25%. The left ventricle has severely decreased function. The left  ventricle demonstrates global hypokinesis. The left ventricular internal  cavity size was mildly dilated. There   is mild left ventricular hypertrophy. Left ventricular diastolic  parameters are indeterminate.   2. Right ventricular systolic function is mildly reduced. The right  ventricular size is normal. There is normal pulmonary artery systolic  pressure. The estimated right ventricular systolic pressure is 22.6 mmHg.   3. Left atrial size was moderately dilated.   4. The mitral valve is normal in structure. No evidence of mitral valve  regurgitation. No evidence of mitral stenosis.   5. The aortic valve is abnormal. There is mild calcification of the  aortic valve. There is mild thickening of the aortic valve. Aortic valve  regurgitation is moderate. No aortic stenosis is present.   6. The  inferior vena cava is normal in size with greater than 50%  respiratory variability, suggesting right atrial pressure of 3 mmHg.     Neuro/Psych neg Seizures CVA (01/2024)    GI/Hepatic Neg liver ROS, Bowel prep,GERD  Medicated,,Gastric AVM   Endo/Other  negative endocrine ROS    Renal/GU CRFRenal disease     Musculoskeletal   Abdominal   Peds  Hematology  (+) Blood dyscrasia, anemia   Anesthesia Other Findings   Reproductive/Obstetrics                              Anesthesia Physical Anesthesia Plan  ASA: 4  Anesthesia Plan: MAC   Post-op Pain Management: Minimal or no pain anticipated   Induction:   PONV Risk Score and Plan: 1 and Propofol  infusion, TIVA and Treatment may vary due to age or medical condition  Airway Management Planned: Natural Airway and Nasal Cannula  Additional Equipment:   Intra-op Plan:   Post-operative Plan:   Informed Consent: I have reviewed the patients History and Physical, chart, labs and discussed the procedure including the risks, benefits and alternatives for the proposed anesthesia with the patient or authorized representative who has indicated his/her understanding and acceptance.     Dental advisory given  Plan Discussed with: CRNA and Anesthesiologist  Anesthesia Plan Comments: (Discussed with patient risks of MAC including, but not limited to, minor pain or discomfort, hearing people in the room, and possible need for backup general anesthesia. Risks for general anesthesia also discussed including, but not limited to, sore throat, hoarse voice, chipped/damaged teeth, injury to vocal cords, nausea and  vomiting, allergic reactions, lung infection, heart attack, stroke, and death. All questions answered. )         Anesthesia Quick Evaluation

## 2024-03-28 NOTE — Progress Notes (Signed)
 ANTICOAGULATION CONSULT NOTE  Pharmacy Consult for warfarin Indication: HM 3  Allergies  Allergen Reactions   Neosporin [Neomycin-Bacitracin Zn-Polymyx] Hives   Doxycycline  Nausea And Vomiting   Mushroom Extract Complex (Obsolete) Nausea And Vomiting   Tape Other (See Comments)    Some tapes/dressings can irritate the skin    Patient Measurements: Height: 5' 9 (175.3 cm) Weight: 77.7 kg (171 lb 4.8 oz) IBW/kg (Calculated) : 70.7  Vital Signs: Temp: 97.6 F (36.4 C) (07/08 0757) Temp Source: Oral (07/08 0757) BP: 92/74 (07/08 0757) Pulse Rate: 62 (07/08 0757)  Labs: Recent Labs    03/26/24 0220 03/27/24 0212 03/27/24 2106 03/28/24 0258  HGB 7.6* 7.5* 8.5* 8.2*  HCT 22.4* 23.2* 26.1* 25.3*  PLT 190 200 196 209  LABPROT 30.5* 21.9*  --  19.8*  INR 2.8* 1.8*  --  1.6*  CREATININE 2.31* 2.30*  --  2.35*    Estimated Creatinine Clearance: 28.4 mL/min (A) (by C-G formula based on SCr of 2.35 mg/dL (H)).  Medical History: Past Medical History:  Diagnosis Date   Arrhythmia    atrial fibrillation   CHF (congestive heart failure) (HCC)    Chronic kidney disease    Coronary artery disease    Hyperlipidemia    Hypertension    Myocardial infarct [facility])     Medications:  See MAR  Assessment: 73 yo male s/p HM3 LVAD 09/2021 with recent CVA 02/10/24 where INR goal was increased to 2.5-3.  Recently bridged with lovenox  40 mg BID (LVAD protocol) from 6/20-6/24 now presenting with GIB.  Pharmacy consulted for warfarin dosing.  PTA warfarin regimen - 2 mg TuThSa, 4 mg all other days (22 mg/wk) per 6/26 office visit (2 mg tablets) No notable DDI's  Appeared to be therapeutic (INR ranging 2-2.2) on warfarin regimen of 4 mg Mon/Wed/Fri and 2 mg all other days.   INR 1.6 subtherapeutic as expected - last dose warfarin 7/3.  Hgb 8.2, pltc 209 - last transfusion 7/7 (7 units total this admission).  LDH 133 stable.  On the schedule for EGD/colonoscopy tomorrow.  Goal of Therapy:   INR 2-2.5 Monitor platelets by anticoagulation protocol: Yes   Plan:  Continue holding warfarin  Daily INR, CBC Monitor for s/sx of bleeding F/U after EGD/colonoscopy tomorrow  Thank you for allowing pharmacy to participate in this patient's care,  Maurilio Fila, PharmD Clinical Pharmacist 03/28/2024  10:49 AM

## 2024-03-28 NOTE — H&P (View-Only) (Signed)
 Daily Progress Note  DOA: 03/21/2024 Hospital Day: 8   Cc: Dark stools / persistent anemia   Brief History:  73 y.o. year old male with a medical history including but not limited to CAD, AFIB, moderate MR, mod-severe TR, chronic systolic CHF s/p LVAD, CKD 3, CVA. Patient admitted 7/1 with black tarry stools on Lovenox  ( overlapping with warfarin / epigastric discomfort. See GI consult 7/2   ASSESSMENT    Persistent GI bleeding, s/p EGD on 7/2 with findings of friable duodenal mucosa and non-bleeding upper GI tract angiodysplastic lesions, all treated with APC  Hgb 6.4 on admission ( down from baseline ~ 12) Today: Hgb improved to 8.2 after another unit of RBCs yesterday. Reports having had a dark stool today.   INR 1.6. Could be bleeding from additional AVMs vrs post APC ulcerations or could be a lower GI source.   Chronic systolic CHF, LVEF < 20  S/p LVAD   Recent CVA in setting of AFIB.  Warfarin on hold   PLAN   --Will plan for EGD / colonoscopy tomorrow.  The risks and benefits of colonoscopy and EGD with possible polypectomy / biopsies were discussed and the patient agrees to proceed.  --I have contacted the LVAD team about plans for procedures tomorrow.    Subjective   No complaints. Had another dark BM today   Objective   GI Studies:    Recent Labs    03/27/24 0212 03/27/24 2106 03/28/24 0258  WBC 7.9 5.8 4.4  HGB 7.5* 8.5* 8.2*  HCT 23.2* 26.1* 25.3*  MCV 91.0 89.1 88.5  PLT 200 196 209   No results for input(s): FOLATE, VITAMINB12, FERRITIN, TIBC, IRONPCTSAT in the last 72 hours. Recent Labs    03/26/24 0220 03/27/24 0212 03/28/24 0258  NA 137 136 136  K 4.5 5.1 4.7  CL 109 109 107  CO2 20* 21* 21*  GLUCOSE 95 130* 102*  BUN 58* 58* 58*  CREATININE 2.31* 2.30* 2.35*  CALCIUM  8.5* 8.3* 8.3*   No results for input(s): PROT, ALBUMIN , AST, ALT, ALKPHOS, BILITOT, BILIDIR, IBILI in the last 72 hours.    Imaging:   CT CHEST ABDOMEN PELVIS WO CONTRAST CLINICAL DATA:  Concern for GI bleed * Tracking Code: BO *  EXAM: CT CHEST, ABDOMEN AND PELVIS WITHOUT CONTRAST  TECHNIQUE: Multidetector CT imaging of the chest, abdomen and pelvis was performed following the standard protocol without IV contrast.  RADIATION DOSE REDUCTION: This exam was performed according to the departmental dose-optimization program which includes automated exposure control, adjustment of the mA and/or kV according to patient size and/or use of iterative reconstruction technique.  COMPARISON:  09/09/2021  FINDINGS: CT CHEST FINDINGS  Cardiovascular: Aortic atherosclerosis. Cardiomegaly. Three-vessel coronary artery calcifications. LVAD. No pericardial effusion.  Mediastinum/Nodes: No enlarged mediastinal, hilar, or axillary lymph nodes. Thyroid  gland, trachea, and esophagus demonstrate no significant findings.  Lungs/Pleura: Moderate centrilobular emphysema. Diffuse bilateral bronchial wall thickening. No pleural effusion or pneumothorax.  Musculoskeletal: No chest wall abnormality. No acute osseous findings.  CT ABDOMEN PELVIS FINDINGS  Hepatobiliary: No solid liver abnormality is seen. Gallstones. No gallbladder wall thickening, or biliary dilatation.  Pancreas: Unremarkable. No pancreatic ductal dilatation or surrounding inflammatory changes.  Spleen: Normal in size without significant abnormality.  Adrenals/Urinary Tract: Unchanged, definitively benign small bilateral adrenal adenomata, for which no further follow-up or characterization is required. Kidneys are normal, without renal calculi, solid lesion, or hydronephrosis. Bladder is unremarkable.  Stomach/Bowel: Stomach is within normal limits.  Appendix appears normal. No evidence of bowel wall thickening, distention, or inflammatory changes. Moderate burden of stool throughout the colon and rectum.  Vascular/Lymphatic: Aortic atherosclerosis. No  enlarged abdominal or pelvic lymph nodes.  Reproductive: Prostatomegaly.  Other: Fat containing left inguinal hernia.  No ascites.  Musculoskeletal: No acute osseous findings.  IMPRESSION: 1. No acute noncontrast CT abnormality of the chest, abdomen, or pelvis. No specific noncontrast CT findings to explain GI bleed. 2. Moderate burden of stool throughout the colon and rectum. 3. Emphysema and diffuse bilateral bronchial wall thickening. 4. Cardiomegaly and coronary artery disease. 5. Cholelithiasis. 6. Prostatomegaly.  Aortic Atherosclerosis (ICD10-I70.0) and Emphysema (ICD10-J43.9).  Electronically Signed   By: Marolyn JONETTA Jaksch M.D.   On: 03/21/2024 21:51 DG CHEST PORT 1 VIEW CLINICAL DATA:  Heart failure.  LVAD.  EXAM: PORTABLE CHEST 1 VIEW  COMPARISON:  Chest x-ray 02/05/2024  FINDINGS: Sternal wires again identified. Left upper chest battery pack with defibrillator leads along the heart. Is also a left ventricular assist device overlying the apex of heart on the left side. Heart itself is enlarged with some vascular congestion. No pneumothorax or effusion. No consolidation. Overlapping cardiac leads. Surgical changes along the right upper chest.  IMPRESSION: Postop chest with enlarged heart and vascular congestion. Defibrillator and left ventricular assist device.  Electronically Signed   By: Ranell Bring M.D.   On: 03/21/2024 17:21     Scheduled inpatient medications:   sodium chloride    Intravenous Once   Chlorhexidine  Gluconate Cloth  6 each Topical Daily   diclofenac  Sodium  2 g Topical QID   multivitamin with minerals  1 tablet Oral Daily   Na Sulfate-K Sulfate-Mg Sulfate concentrate  0.5 kit Oral Once   Followed by   Na Sulfate-K Sulfate-Mg Sulfate concentrate  0.5 kit Oral Once   octreotide   100 mcg Subcutaneous Q12H   pantoprazole   40 mg Oral BID   polyethylene glycol  17 g Oral BID   rosuvastatin   10 mg Oral QHS   senna-docusate  1 tablet Oral  QHS   sildenafil   20 mg Oral TID   simethicone   240 mg Oral Once   Warfarin - Pharmacist Dosing Inpatient   Does not apply q1600   Continuous inpatient infusions:  PRN inpatient medications: acetaminophen , ondansetron  (ZOFRAN ) IV  Vital signs in last 24 hours: Temp:  [97.6 F (36.4 C)-98.1 F (36.7 C)] 97.6 F (36.4 C) (07/08 1131) Pulse Rate:  [60-107] 60 (07/08 1131) Resp:  [15-18] 15 (07/08 1131) BP: (83-97)/(58-82) 83/73 (07/08 1157) SpO2:  [97 %-100 %] 100 % (07/08 1131) Weight:  [77.7 kg] 77.7 kg (07/08 0650) Last BM Date : 03/27/24  Intake/Output Summary (Last 24 hours) at 03/28/2024 1508 Last data filed at 03/28/2024 0300 Gross per 24 hour  Intake --  Output 475 ml  Net -475 ml    Intake/Output from previous day: 07/07 0701 - 07/08 0700 In: 447.3 [Blood:447.3] Out: 475 [Urine:475] Intake/Output this shift: No intake/output data recorded.   Physical Exam:  General: Alert male in NAD Heart:  LVAD hum  Pulmonary: Normal respiratory effort Abdomen: Soft, nondistended, nontender. Normal bowel sounds. Extremities: No lower extremity edema  Neurologic: Alert and oriented Psych: Pleasant. Cooperative     LOS: 7 days   Vina Dasen ,NP 03/28/2024, 3:08 PM   I have taken an interval history, thoroughly reviewed the chart and examined the patient. I agree with the Advanced Practitioner's note, impression and recommendations, and have recorded additional findings, impressions and recommendations below.  I performed a substantive portion of this encounter (>50% time spent), including a complete performance of the medical decision making.  My additional thoughts are as follows:  Patient clinically stable today after PRBC transfusion yesterday.  INR 1.6, hemoglobin 8.2  We are planning an EGD and colonoscopy tomorrow with support from anesthesia and the LVAD team.  Patient agreeable after discussion of procedure and risks.  The benefits and risks of the planned  procedure(s) were described in detail with the patient or (when appropriate) their health care proxy.  Risks were outlined as including, but not limited to, bleeding, infection, perforation, adverse medication reaction leading to cardiac or pulmonary decompensation, pancreatitis (if ERCP).  The limitation of incomplete mucosal visualization was also discussed.  No guarantees or warranties were given. Patient at increased risk for cardiopulmonary complications of procedure due to medical comorbidities.  Medically complex patient-high degree medical decision making   Victory LITTIE Brand III Office:[phone]

## 2024-03-28 NOTE — Progress Notes (Signed)
 LVAD Coordinator Rounding Note:  Pt admitted 03/21/24 for suspected GIB. Pt states he started feeling poorly Sunday noting dizziness and generalized fatigue that has continued to worsen throughout the week. Yesterday morning his wife states he fell at home. Denies head trauma. 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 Pt was subtherapeutic 6/20 and bridged with Lovenox  till 6/24.   Hgb 6.4 on arrival pt received 2 PRBC. CT chest/abdomen/pelvis obtained overnight.GI consulted. Awaiting recommendations.   IMPRESSION: 1. No acute noncontrast CT abnormality of the chest, abdomen, or pelvis. No specific noncontrast CT findings to explain GI bleed. 2. Moderate burden of stool throughout the colon and rectum. 3. Emphysema and diffuse bilateral bronchial wall thickening. 4. Cardiomegaly and coronary artery disease. 5. Cholelithiasis. 6. Prostatomegaly.  Enteroscopy performed 03/22/24 . 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 Pt receiving SQ Octreotide . Will set outpatient injections upon discharge.  Pt sitting up in the recliner. States he is feeling better today. States he is ready to get his GI procedures done so he can go home. Per GI team pt will need EGD and colonoscopy on Wednesday 03/29/24.   Vital signs: Temp: 97.6 HR: 62 Doppler Pressure: 84 Auto Cuff BP: 92/74 (81) O2 Sat: 100% on RA Wt: 175.9>175.1>170.6>171.3 lbs    LVAD interrogation reveals:  Speed: 5300 Flow: 4.2 Power: 3.8w PI: 3.1 Hematocrit: 23  Alarms: none Events: rare  Fixed speed: 5300 Low speed limit: 5000  Wife brought his back up bag- now on cart.  Drive Line:  Existing VAD dressing C/D/I with anchor intact and accurately applied. Weekly dressing changes per VAD coordinator or pts wife. Next dressing change due: 03/31/24 by caregiver. Weekly kit on VAD cart.   Labs:  Hgb trend:  6.4>7.4>8.1>7.9>9.1>8.3>7.5>8.2  LDH trend: 124>102>107>119>110>133  INR trend: 2.2>2.3>2.2>2.5>1.8>1.6  Anticoagulation Plan: -INR Goal: 2-2.5 lowered this admission d/t GIB  Blood Products: 03/21/24: 2 PRBC 03/22/24: 1 PRBC 03/23/24: 1 PRBC 03/26/24: 1 PRBC 03/27/24: 1 PRBC  Device: - Cabin crew - V paced 60 -Therapies: OFF  Infection:   Plan/Recommendations:  Page VAD coordinator for drive line or equipment concerns 2. Weekly dressing changes per VAD Coordinator or caregiver. 3. VAD coordinator will accompany pt to EGD/colonoscopy tomorrow.   Isaiah Knoll RN VAD Coordinator  Office: [phone]  24/7 Pager: [phone]

## 2024-03-28 NOTE — Progress Notes (Signed)
 Mobility Specialist Progress Note;   03/28/24 0923  Mobility  Activity Transferred from bed to chair  Level of Assistance Contact guard assist, steadying assist  Assistive Device None  Distance Ambulated (ft) 5 ft  Activity Response Tolerated well  Mobility Referral Yes  Mobility visit 1 Mobility  Mobility Specialist Start Time (ACUTE ONLY) [id number]  Mobility Specialist Stop Time (ACUTE ONLY) 0929  Mobility Specialist Time Calculation (min) (ACUTE ONLY) 6 min   Pt agreeable to mobility. Required MinG assistance to safely ambulate to chair. Limited d/t R knee pain. Pt left in chair with all needs met, call bell in reach.  Lauraine Erm Mobility Specialist Please contact via SecureChat or [employer] [phone]

## 2024-03-28 NOTE — Plan of Care (Signed)

## 2024-03-28 NOTE — Progress Notes (Addendum)
 Patient ID: Isaac [name], male   DOB: [birth year], 73 y.o.   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    S/p 2u PRBcs 7/1, 1 unit 7/2, 7/3, 7/5, 7/6 and 7/7.  Hgb 8.2 this morning (down trending already). No further stools since yesterday.   Have been holding warfarin. INR 1.6. GI has seen. Plan for EGD/colonoscopy 03/29/24  Feels fine this morning.    LVAD Interrogation HM III: Speed: 5350 Flow: 4.3 PI: 3.5 Power: 3.8. x2 PI events today  Objective:   Weight Range: 77.7 kg Body mass index is 25.3 kg/m.   Vital Signs:   Temp:  [97.6 F (36.4 C)-98.3 F (36.8 C)] 97.6 F (36.4 C) (07/08 0757) Pulse Rate:  [60-107] 62 (07/08 0757) Resp:  [14-20] 17 (07/08 0757) BP: (85-97)/(58-82) 92/74 (07/08 0757) SpO2:  [95 %-100 %] 100 % (07/08 0757) Weight:  [77.7 kg] 77.7 kg (07/08 0650) Last BM Date : 03/27/24  Weight change: Filed Weights   03/26/24 0500 03/27/24 0629 03/28/24 0650  Weight: 79.6 kg 77.4 kg 77.7 kg   Intake/Output:   Intake/Output Summary (Last 24 hours) at 03/28/2024 0836 Last data filed at 03/28/2024 0300 Gross per 24 hour  Intake 447.25 ml  Output 475 ml  Net -27.75 ml    Physical Exam   General:  Well appearing. No resp difficulty Neck: supple. JVP ~9.  Cor: Mechanical heart sounds with LVAD hum present. Lungs: Clear Driveline: C/D/I; securement device intact and driveline incorporated Extremities: no cyanosis, clubbing, rash, edema Neuro: alert & oriented x3. Moves all 4 extremities w/o difficulty. Affect pleasant   Telemetry   A-paced 60s (Personally reviewed)    EKG    No new EKG to review  Labs  CBC Recent Labs    03/27/24 2106 03/28/24 0258  WBC 5.8 4.4  NEUTROABS 4.5  --   HGB 8.5* 8.2*  HCT 26.1* 25.3*  MCV 89.1 88.5  PLT 196 209   Basic Metabolic  Panel Recent Labs    03/27/24 0212 03/28/24 0258  NA 136 136  K 5.1 4.7  CL 109 107  CO2 21* 21*  GLUCOSE 130* 102*  BUN 58* 58*  CREATININE 2.30* 2.35*  CALCIUM  8.3* 8.3*   Liver Function Tests No results for input(s): AST, ALT, ALKPHOS, BILITOT, PROT, ALBUMIN  in the last 72 hours.  No results for input(s): LIPASE, AMYLASE in the last 72 hours. Cardiac Enzymes No results for input(s): CKTOTAL, CKMB, CKMBINDEX, TROPONINI in the last 72 hours.  BNP: BNP (last 3 results) Recent Labs    02/05/24 1106  BNP 343.8*   ProBNP (last 3 results) No results for input(s): PROBNP in the last 8760 hours.  D-Dimer No results for input(s): DDIMER in the last 72 hours. Hemoglobin A1C No results for input(s): HGBA1C in the last 72 hours. Fasting Lipid Panel No results for input(s): CHOL, HDL, LDLCALC, TRIG, CHOLHDL, LDLDIRECT in the last 72 hours. Thyroid  Function Tests No results for input(s): TSH, T4TOTAL, T3FREE, THYROIDAB in the last 72 hours.  Invalid input(s): FREET3  Other results:  Imaging  No results found.  Medications:   Scheduled Medications:  sodium chloride    Intravenous Once   Chlorhexidine  Gluconate Cloth  6 each Topical Daily   diclofenac  Sodium  2 g Topical QID   multivitamin  with minerals  1 tablet Oral Daily   Na Sulfate-K Sulfate-Mg Sulfate concentrate  0.5 kit Oral Once   Followed by   Na Sulfate-K Sulfate-Mg Sulfate concentrate  0.5 kit Oral Once   octreotide   100 mcg Subcutaneous Q12H   pantoprazole   40 mg Oral BID   polyethylene glycol  17 g Oral BID   rosuvastatin   10 mg Oral QHS   senna-docusate  1 tablet Oral QHS   sildenafil   20 mg Oral TID   simethicone   240 mg Oral Once   Warfarin - Pharmacist Dosing Inpatient   Does not apply q1600    Infusions:   PRN Medications: acetaminophen , ondansetron  (ZOFRAN ) IV Patient Profile   73 y.o. with history of CAD, HTN, atrial fibrillation, and  chronic systolic CHF s/p LVAD HM3. Presenting with GIB.   Assessment/Plan  1. GIB: Patient has epigastric pain, nausea, and melena x 1 week after getting Lovenox  for several days.  INR goal had been increased to 2.5-3 since his CVA. Reports lightheadedness. Hgb 6.4 on admission. Has had 6 units PRBCs so far.  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   - Hgb 8.2 today.  Had melena again 7/6 and dark stool 7/7. Suspect he will need a unit tomorrow morning.  - Continue protonix  40 mg po bid - INR goal lowered to 2-2.5 => Have been holding warfarin. INR 1.6 today - Continue octreotide  100 mcg [place] bid.  - Recommended monthly octreotide  as outpatient, patient and wife want to think about this.   - Hgb continues to drop despite getting PRBCs and ongoing melena. GI re consulted yesterday. Plan for repeat EGD and colonoscopy tomorrow.  2. Chronic systolic CHF:  Long-standing cardiomyopathy.  [employer] dual chamber ICD.  Echo in 12/22 with EF < 20%, severe LV dilation, restrictive diastolic function, moderate RV dysfunction, moderate MR, mod-severe TR. Cause of cardiomyopathy is uncertain.  He has a history of CAD, but I do not think that the described CAD from the past could explain his cardiomyopathy, but CAD could have progressed.  He was admitted in 12/22 with cardiogenic shock and Impella 5.5. s/p HM III VAD on 09/17/22.  Post-op course complicated by AKI and RV failure. He was on HD for several months but is now off.  Echo in 7/24 showed EF < 20%, IV septum midline to slightly leftward, RV moderately dilated with moderately decreased systolic function, IVC dilated with severe TR.  Echo in 5/25 with EF 20-25%, mild LVH, LVAD present, mild RV dysfunction, moderate AI, mild RV dysfunction with normal RV size, severe TR.  Stable NYHA class II, MAP 70s-80s and LVAD parameters stable today. Not markedly volume overloaded.  - Lasix  on hold with bleeding.  -  Continue sildenafil  20 mg TID - Warfarin with INR goal 2-2.5. Holding with ongoing bleeding.  3. Tricuspid regurgitation:  Tricuspid repair not done at time of VAD due to proximity of ICD wires and hypotension during surgery. He has severe TR.  4. Atrial fibrillation: Paroxysmal.  He is off amiodarone  with resolution of nausea. Currently a-paced.  5. CKD stage IV: Suspect intra-op hypotension at the time of LVAD placement led to development of ATN => urine sediment looked like ATN per renal. Had initial CVVH and transitioned over to HD. He is now off HD.  Creatinine 2.3 today.   - Holding Lasix .  6. CAD: History of PCI to OM1 in 2007 and RCA in  2013.  No CP or ACs.  - Deferred cardiac cath in 12/22 due to AKI and plan for VAD - Continue Crestor .   7. Abnormal thyroid  function tests: Patient has had elevated TSH and free T4, mixed pattern. We have stopped amiodarone . 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  TSH normal this admission.  - He follows with endocrinology.   8. Nausea: This seems to have been due to amiodarone , resolved off amiodarone .  Now has recurred, possibly due to upper GI bleeding.  Resolved 9. 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  INR goal increased to 2.5-3 post-CVA, now goal decreased back to 2-2.5.  10. Right knee pain:  - Uric acid 7.5.  - appears to be pre-patellar bursitis, will follow for now  I reviewed the LVAD parameters from today, and compared the results to the patient's prior recorded data.  No programming changes were made.  The LVAD is functioning within specified parameters.  The patient performs LVAD self-test daily.  LVAD interrogation was negative for any significant power changes, alarms or PI events/speed drops.  LVAD equipment check completed and is in good working order.  Back-up equipment present.   LVAD education done on emergency procedures and precautions and  reviewed exit site care.   Beckey LITTIE Coe AGACNP-BC  03/28/2024 8:36 AM  VAD Team Pager [phone] (7am - 7am)   Patient seen and examined with the above-signed Advanced Practice Provider and/or Housestaff. I personally reviewed laboratory data, imaging studies and relevant notes. I independently examined the patient and formulated the important aspects of the plan. I have edited the note to reflect any of my changes or salient points. I have personally discussed the plan with the patient and/or family.  Still with dark stools. No ab pain. HGb 8.2 after 1u transfusion yesterday/   R knee still sore  General:  Sitting in chair. NAD.  HEENT: normal  Neck: supple. JVP not elevated.  Carotids 2+ bilat; no bruits. No lymphadenopathy or thryomegaly appreciated. Cor: LVAD hum.  Lungs: Clear. Abdomen: obese soft, nontender, non-distended. No hepatosplenomegaly. No bruits or masses. Good bowel sounds. Driveline site clean. Anchor in place.  Extremities: no cyanosis, clubbing, rash. Warm no edema  R pre-patellar bursa red and tender Neuro: alert & oriented x 3. No focal deficits. Moves all 4 without problem   Has evidence of ongoing slow GIB. Hgb improved after transfusion. Continue PPI  Case d/w GI. Plan for repeat EGD + colon tomorrow. Bowel prep underway.   Continue to hold [facility].   VAD interrogated personally. Parameters stable.  Use ICE for R knee pre-patellar burstitis. No NSAIDS with bleeding  Toribio Fuel, MD  5:17 PM

## 2024-03-28 NOTE — Progress Notes (Addendum)
 Daily Progress Note  DOA: 03/21/2024 Hospital Day: 8   Cc: Dark stools / persistent anemia   Brief History:  73 y.o. year old male with a medical history including but not limited to CAD, AFIB, moderate MR, mod-severe TR, chronic systolic CHF s/p LVAD, CKD 3, CVA. Patient admitted 7/1 with black tarry stools on Lovenox  ( overlapping with warfarin / epigastric discomfort. See GI consult 7/2   ASSESSMENT    Persistent GI bleeding, s/p EGD on 7/2 with findings of friable duodenal mucosa and non-bleeding upper GI tract angiodysplastic lesions, all treated with APC  Hgb 6.4 on admission ( down from baseline ~ 12) Today: Hgb improved to 8.2 after another unit of RBCs yesterday. Reports having had a dark stool today.   INR 1.6. Could be bleeding from additional AVMs vrs post APC ulcerations or could be a lower GI source.   Chronic systolic CHF, LVEF < 20  S/p LVAD   Recent CVA in setting of AFIB.  Warfarin on hold   PLAN   --Will plan for EGD / colonoscopy tomorrow.  The risks and benefits of colonoscopy and EGD with possible polypectomy / biopsies were discussed and the patient agrees to proceed.  --I have contacted the LVAD team about plans for procedures tomorrow.    Subjective   No complaints. Had another dark BM today   Objective   GI Studies:    Recent Labs    03/27/24 0212 03/27/24 2106 03/28/24 0258  WBC 7.9 5.8 4.4  HGB 7.5* 8.5* 8.2*  HCT 23.2* 26.1* 25.3*  MCV 91.0 89.1 88.5  PLT 200 196 209   No results for input(s): FOLATE, VITAMINB12, FERRITIN, TIBC, IRONPCTSAT in the last 72 hours. Recent Labs    03/26/24 0220 03/27/24 0212 03/28/24 0258  NA 137 136 136  K 4.5 5.1 4.7  CL 109 109 107  CO2 20* 21* 21*  GLUCOSE 95 130* 102*  BUN 58* 58* 58*  CREATININE 2.31* 2.30* 2.35*  CALCIUM  8.5* 8.3* 8.3*   No results for input(s): PROT, ALBUMIN , AST, ALT, ALKPHOS, BILITOT, BILIDIR, IBILI in the last 72 hours.    Imaging:   CT CHEST ABDOMEN PELVIS WO CONTRAST CLINICAL DATA:  Concern for GI bleed * Tracking Code: BO *  EXAM: CT CHEST, ABDOMEN AND PELVIS WITHOUT CONTRAST  TECHNIQUE: Multidetector CT imaging of the chest, abdomen and pelvis was performed following the standard protocol without IV contrast.  RADIATION DOSE REDUCTION: This exam was performed according to the departmental dose-optimization program which includes automated exposure control, adjustment of the mA and/or kV according to patient size and/or use of iterative reconstruction technique.  COMPARISON:  09/09/2021  FINDINGS: CT CHEST FINDINGS  Cardiovascular: Aortic atherosclerosis. Cardiomegaly. Three-vessel coronary artery calcifications. LVAD. No pericardial effusion.  Mediastinum/Nodes: No enlarged mediastinal, hilar, or axillary lymph nodes. Thyroid  gland, trachea, and esophagus demonstrate no significant findings.  Lungs/Pleura: Moderate centrilobular emphysema. Diffuse bilateral bronchial wall thickening. No pleural effusion or pneumothorax.  Musculoskeletal: No chest wall abnormality. No acute osseous findings.  CT ABDOMEN PELVIS FINDINGS  Hepatobiliary: No solid liver abnormality is seen. Gallstones. No gallbladder wall thickening, or biliary dilatation.  Pancreas: Unremarkable. No pancreatic ductal dilatation or surrounding inflammatory changes.  Spleen: Normal in size without significant abnormality.  Adrenals/Urinary Tract: Unchanged, definitively benign small bilateral adrenal adenomata, for which no further follow-up or characterization is required. Kidneys are normal, without renal calculi, solid lesion, or hydronephrosis. Bladder is unremarkable.  Stomach/Bowel: Stomach is within normal limits.  Appendix appears normal. No evidence of bowel wall thickening, distention, or inflammatory changes. Moderate burden of stool throughout the colon and rectum.  Vascular/Lymphatic: Aortic atherosclerosis. No  enlarged abdominal or pelvic lymph nodes.  Reproductive: Prostatomegaly.  Other: Fat containing left inguinal hernia.  No ascites.  Musculoskeletal: No acute osseous findings.  IMPRESSION: 1. No acute noncontrast CT abnormality of the chest, abdomen, or pelvis. No specific noncontrast CT findings to explain GI bleed. 2. Moderate burden of stool throughout the colon and rectum. 3. Emphysema and diffuse bilateral bronchial wall thickening. 4. Cardiomegaly and coronary artery disease. 5. Cholelithiasis. 6. Prostatomegaly.  Aortic Atherosclerosis (ICD10-I70.0) and Emphysema (ICD10-J43.9).  Electronically Signed   By: Marolyn JONETTA Jaksch M.D.   On: 03/21/2024 21:51 DG CHEST PORT 1 VIEW CLINICAL DATA:  Heart failure.  LVAD.  EXAM: PORTABLE CHEST 1 VIEW  COMPARISON:  Chest x-ray 02/05/2024  FINDINGS: Sternal wires again identified. Left upper chest battery pack with defibrillator leads along the heart. Is also a left ventricular assist device overlying the apex of heart on the left side. Heart itself is enlarged with some vascular congestion. No pneumothorax or effusion. No consolidation. Overlapping cardiac leads. Surgical changes along the right upper chest.  IMPRESSION: Postop chest with enlarged heart and vascular congestion. Defibrillator and left ventricular assist device.  Electronically Signed   By: Ranell Bring M.D.   On: 03/21/2024 17:21     Scheduled inpatient medications:   sodium chloride    Intravenous Once   Chlorhexidine  Gluconate Cloth  6 each Topical Daily   diclofenac  Sodium  2 g Topical QID   multivitamin with minerals  1 tablet Oral Daily   Na Sulfate-K Sulfate-Mg Sulfate concentrate  0.5 kit Oral Once   Followed by   Na Sulfate-K Sulfate-Mg Sulfate concentrate  0.5 kit Oral Once   octreotide   100 mcg Subcutaneous Q12H   pantoprazole   40 mg Oral BID   polyethylene glycol  17 g Oral BID   rosuvastatin   10 mg Oral QHS   senna-docusate  1 tablet Oral  QHS   sildenafil   20 mg Oral TID   simethicone   240 mg Oral Once   Warfarin - Pharmacist Dosing Inpatient   Does not apply q1600   Continuous inpatient infusions:  PRN inpatient medications: acetaminophen , ondansetron  (ZOFRAN ) IV  Vital signs in last 24 hours: Temp:  [97.6 F (36.4 C)-98.1 F (36.7 C)] 97.6 F (36.4 C) (07/08 1131) Pulse Rate:  [60-107] 60 (07/08 1131) Resp:  [15-18] 15 (07/08 1131) BP: (83-97)/(58-82) 83/73 (07/08 1157) SpO2:  [97 %-100 %] 100 % (07/08 1131) Weight:  [77.7 kg] 77.7 kg (07/08 0650) Last BM Date : 03/27/24  Intake/Output Summary (Last 24 hours) at 03/28/2024 1508 Last data filed at 03/28/2024 0300 Gross per 24 hour  Intake --  Output 475 ml  Net -475 ml    Intake/Output from previous day: 07/07 0701 - 07/08 0700 In: 447.3 [Blood:447.3] Out: 475 [Urine:475] Intake/Output this shift: No intake/output data recorded.   Physical Exam:  General: Alert male in NAD Heart:  LVAD hum  Pulmonary: Normal respiratory effort Abdomen: Soft, nondistended, nontender. Normal bowel sounds. Extremities: No lower extremity edema  Neurologic: Alert and oriented Psych: Pleasant. Cooperative     LOS: 7 days   Vina Dasen ,NP 03/28/2024, 3:08 PM   I have taken an interval history, thoroughly reviewed the chart and examined the patient. I agree with the Advanced Practitioner's note, impression and recommendations, and have recorded additional findings, impressions and recommendations below.  I performed a substantive portion of this encounter (>50% time spent), including a complete performance of the medical decision making.  My additional thoughts are as follows:  Patient clinically stable today after PRBC transfusion yesterday.  INR 1.6, hemoglobin 8.2  We are planning an EGD and colonoscopy tomorrow with support from anesthesia and the LVAD team.  Patient agreeable after discussion of procedure and risks.  The benefits and risks of the planned  procedure(s) were described in detail with the patient or (when appropriate) their health care proxy.  Risks were outlined as including, but not limited to, bleeding, infection, perforation, adverse medication reaction leading to cardiac or pulmonary decompensation, pancreatitis (if ERCP).  The limitation of incomplete mucosal visualization was also discussed.  No guarantees or warranties were given. Patient at increased risk for cardiopulmonary complications of procedure due to medical comorbidities.  Medically complex patient-high degree medical decision making   Victory LITTIE Brand III Office:[phone]

## 2024-03-28 NOTE — Plan of Care (Signed)
  Problem: Education: Goal: Patient will understand all VAD equipment and how it functions Outcome: Progressing Goal: Patient will be able to verbalize current INR target range and antiplatelet therapy for discharge home Outcome: Progressing   Problem: Cardiac: Goal: LVAD will function as expected and patient will experience no clinical alarms Outcome: Progressing   Problem: Education: Goal: Knowledge of General Education information will improve Description: Including pain rating scale, medication(s)/side effects and non-pharmacologic comfort measures Outcome: Progressing   Problem: Health Behavior/Discharge Planning: Goal: Ability to manage health-related needs will improve Outcome: Progressing   Problem: Clinical Measurements: Goal: Ability to maintain clinical measurements within normal limits will improve Outcome: Progressing Goal: Will remain free from infection Outcome: Progressing Goal: Diagnostic test results will improve Outcome: Progressing Goal: Respiratory complications will improve Outcome: Progressing Goal: Cardiovascular complication will be avoided Outcome: Progressing   Problem: Activity: Goal: Risk for activity intolerance will decrease Outcome: Progressing   Problem: Nutrition: Goal: Adequate nutrition will be maintained Outcome: Progressing   Problem: Coping: Goal: Level of anxiety will decrease Outcome: Progressing   Problem: Elimination: Goal: Will not experience complications related to bowel motility Outcome: Progressing Goal: Will not experience complications related to urinary retention Outcome: Progressing   Problem: Pain Managment: Goal: General experience of comfort will improve and/or be controlled Outcome: Progressing   Problem: Safety: Goal: Ability to remain free from injury will improve Outcome: Progressing   Problem: Skin Integrity: Goal: Risk for impaired skin integrity will decrease Outcome: Progressing

## 2024-03-29 ENCOUNTER — Inpatient Hospital Stay (HOSPITAL_COMMUNITY): Payer: Self-pay | Admitting: Anesthesiology

## 2024-03-29 ENCOUNTER — Encounter (HOSPITAL_COMMUNITY): Payer: Self-pay | Admitting: Anesthesiology

## 2024-03-29 ENCOUNTER — Encounter (HOSPITAL_COMMUNITY): Admission: EM | Disposition: A | Discharge status: Home / Self Care | Payer: Self-pay | Attending: Cardiology

## 2024-03-29 ENCOUNTER — Encounter (HOSPITAL_COMMUNITY): Payer: Self-pay | Admitting: Cardiology

## 2024-03-29 DIAGNOSIS — K648 Other hemorrhoids: Secondary | ICD-10-CM

## 2024-03-29 DIAGNOSIS — I5022 Chronic systolic (congestive) heart failure: Secondary | ICD-10-CM | POA: Diagnosis not present

## 2024-03-29 DIAGNOSIS — K259 Gastric ulcer, unspecified as acute or chronic, without hemorrhage or perforation: Secondary | ICD-10-CM

## 2024-03-29 DIAGNOSIS — K5730 Diverticulosis of large intestine without perforation or abscess without bleeding: Secondary | ICD-10-CM

## 2024-03-29 DIAGNOSIS — I2510 Atherosclerotic heart disease of native coronary artery without angina pectoris: Secondary | ICD-10-CM

## 2024-03-29 DIAGNOSIS — K922 Gastrointestinal hemorrhage, unspecified: Secondary | ICD-10-CM | POA: Diagnosis not present

## 2024-03-29 DIAGNOSIS — D62 Acute posthemorrhagic anemia: Secondary | ICD-10-CM

## 2024-03-29 DIAGNOSIS — K250 Acute gastric ulcer with hemorrhage: Secondary | ICD-10-CM

## 2024-03-29 LAB — CBC
HCT: 26.4 % — ABNORMAL LOW (ref 39.0–52.0)
Hemoglobin: 8.6 g/dL — ABNORMAL LOW (ref 13.0–17.0)
MCH: 29.2 pg (ref 26.0–34.0)
MCHC: 32.6 g/dL (ref 30.0–36.0)
MCV: 89.5 fL (ref 80.0–100.0)
Platelets: 236 K/uL (ref 150–400)
RBC: 2.95 MIL/uL — ABNORMAL LOW (ref 4.22–5.81)
RDW: 17.3 % — ABNORMAL HIGH (ref 11.5–15.5)
WBC: 4.6 K/uL (ref 4.0–10.5)
nRBC: 0 % (ref 0.0–0.2)

## 2024-03-29 LAB — LACTATE DEHYDROGENASE: LDH: 134 U/L (ref 98–192)

## 2024-03-29 LAB — BASIC METABOLIC PANEL WITH GFR
Anion gap: 9 (ref 5–15)
BUN: 40 mg/dL — ABNORMAL HIGH (ref 8–23)
CO2: 23 mmol/L (ref 22–32)
Calcium: 8.4 mg/dL — ABNORMAL LOW (ref 8.9–10.3)
Chloride: 108 mmol/L (ref 98–111)
Creatinine, Ser: 2.3 mg/dL — ABNORMAL HIGH (ref 0.61–1.24)
GFR, Estimated: 29 mL/min — ABNORMAL LOW (ref >60)
Glucose, Bld: 86 mg/dL (ref 70–99)
Potassium: 4.2 mmol/L (ref 3.5–5.1)
Sodium: 140 mmol/L (ref 135–145)

## 2024-03-29 LAB — PROTIME-INR
INR: 1.5 — ABNORMAL HIGH (ref 0.8–1.2)
Prothrombin Time: 18.4 s — ABNORMAL HIGH (ref 11.4–15.2)

## 2024-03-29 LAB — HEPARIN LEVEL (UNFRACTIONATED): Heparin Unfractionated: <0.1 [IU]/mL — ABNORMAL LOW (ref 0.30–0.70)

## 2024-03-29 SURGERY — COLONOSCOPY
Anesthesia: Monitor Anesthesia Care

## 2024-03-29 MED ORDER — PHENYLEPHRINE HCL-NACL 20-0.9 MG/250ML-% IV SOLN
INTRAVENOUS | Status: DC | PRN
  Administered 2024-03-29: 50 ug/min via INTRAVENOUS

## 2024-03-29 MED ORDER — PROPOFOL 1000 MG/100ML IV EMUL
INTRAVENOUS | Status: AC
  Filled 2024-03-29: qty 100

## 2024-03-29 MED ORDER — PROPOFOL 10 MG/ML IV BOLUS
INTRAVENOUS | Status: DC | PRN
  Administered 2024-03-29: 60 mg via INTRAVENOUS

## 2024-03-29 MED ORDER — WARFARIN SODIUM 2 MG PO TABS
2.0000 mg | ORAL_TABLET | Freq: Once | ORAL | Status: AC
  Administered 2024-03-29: 2 mg via ORAL
  Filled 2024-03-29: qty 1

## 2024-03-29 MED ORDER — LIDOCAINE 2% (20 MG/ML) 5 ML SYRINGE
INTRAMUSCULAR | Status: DC | PRN
  Administered 2024-03-29: 60 mg via INTRAVENOUS

## 2024-03-29 MED ORDER — PHENYLEPHRINE HCL (PRESSORS) 10 MG/ML IV SOLN
INTRAVENOUS | Status: DC | PRN
  Administered 2024-03-29 (×2): 80 ug via INTRAVENOUS

## 2024-03-29 MED ORDER — PHENYLEPHRINE HCL-NACL 20-0.9 MG/250ML-% IV SOLN
INTRAVENOUS | Status: AC
Start: 2024-03-29 — End: 2024-03-29
  Filled 2024-03-29: qty 250

## 2024-03-29 MED ORDER — PROPOFOL 500 MG/50ML IV EMUL
INTRAVENOUS | Status: DC | PRN
Start: 2024-03-29 — End: 2024-03-29
  Administered 2024-03-29: 100 ug/kg/min via INTRAVENOUS

## 2024-03-29 MED ORDER — SODIUM CHLORIDE 0.9 % IV SOLN
INTRAVENOUS | Status: AC | PRN
  Administered 2024-03-29: 500 mL via INTRAMUSCULAR

## 2024-03-29 MED ORDER — HEPARIN (PORCINE) 25000 UT/250ML-% IV SOLN
500.0000 [IU]/h | INTRAVENOUS | Status: DC
  Administered 2024-03-29 – 2024-03-31 (×2): 500 [IU]/h via INTRAVENOUS
  Filled 2024-03-29 (×2): qty 250

## 2024-03-29 NOTE — Plan of Care (Signed)
  Problem: Education: Goal: Patient will understand all VAD equipment and how it functions Outcome: Progressing   Problem: Cardiac: Goal: LVAD will function as expected and patient will experience no clinical alarms Outcome: Progressing   Problem: Education: Goal: Knowledge of General Education information will improve Description: Including pain rating scale, medication(s)/side effects and non-pharmacologic comfort measures Outcome: Progressing   Problem: Clinical Measurements: Goal: Will remain free from infection Outcome: Progressing   Problem: Activity: Goal: Risk for activity intolerance will decrease Outcome: Progressing   Problem: Nutrition: Goal: Adequate nutrition will be maintained Outcome: Progressing   Problem: Coping: Goal: Level of anxiety will decrease Outcome: Progressing   Problem: Safety: Goal: Ability to remain free from injury will improve Outcome: Progressing   Problem: Skin Integrity: Goal: Risk for impaired skin integrity will decrease Outcome: Progressing

## 2024-03-29 NOTE — Transfer of Care (Signed)
 Immediate Anesthesia Transfer of Care Note  Patient: Isaac [name]  Procedure(s) Performed: COLONOSCOPY EGD (ESOPHAGOGASTRODUODENOSCOPY)  Patient Location: PACU  Anesthesia Type:MAC  Level of Consciousness: awake and alert   Airway & Oxygen Therapy: Patient Spontanous Breathing and Patient connected to face mask oxygen  Post-op Assessment: Report given to RN and Post -op Vital signs reviewed and stable  Post vital signs: Reviewed and stable  Last Vitals:  Vitals Value Taken Time  BP 103/67 03/29/24 11:35  Temp 36.2 C 03/29/24 11:33  Pulse 59 03/29/24 11:36  Resp 15 03/29/24 11:36  SpO2 100 % 03/29/24 11:36  Vitals shown include unfiled device data.  Last Pain:  Vitals:   03/29/24 1133  TempSrc:   PainSc: 0-No pain      Patients Stated Pain Goal: 2 (03/27/24 1924)  Complications: No notable events documented.

## 2024-03-29 NOTE — Interval H&P Note (Signed)
 History and Physical Interval Note:  03/29/2024 10:39 AM  Isaac [name]  has presented today for surgery, with the diagnosis of Anemia, gastrointestinal bleeding.  The various methods of treatment have been discussed with the patient and family. After consideration of risks, benefits and other options for treatment, the patient has consented to  Procedure(s): COLONOSCOPY (N/A) EGD (ESOPHAGOGASTRODUODENOSCOPY) (N/A) as a surgical intervention.  The patient's history has been reviewed, patient examined, no change in status, stable for surgery.  I have reviewed the patient's chart and labs.  Questions were answered to the patient's satisfaction.    Patient seen in endoscopy preprocedure area and he is stable for EGD and colonoscopy today.  Hemoglobin 8.6, INR 1.5 Victory LITTIE Brand III

## 2024-03-29 NOTE — Progress Notes (Addendum)
 ANTICOAGULATION CONSULT NOTE  Pharmacy Consult for warfarin Indication: HM 3  Allergies  Allergen Reactions   Neosporin [Neomycin-Bacitracin Zn-Polymyx] Hives   Doxycycline  Nausea And Vomiting   Mushroom Extract Complex (Obsolete) Nausea And Vomiting   Tape Other (See Comments)    Some tapes/dressings can irritate the skin    Patient Measurements: Height: 5' 9 (175.3 cm) Weight: 78.8 kg (173 lb 11.2 oz) IBW/kg (Calculated) : 70.7  Vital Signs: Temp: 97.6 F (36.4 C) (07/09 1212) Temp Source: Axillary (07/09 1212) BP: 100/81 (07/09 1212) Pulse Rate: 60 (07/09 1212)  Labs: Recent Labs    03/27/24 0212 03/27/24 2106 03/28/24 0258 03/29/24 0458  HGB 7.5* 8.5* 8.2* 8.6*  HCT 23.2* 26.1* 25.3* 26.4*  PLT 200 196 209 236  LABPROT 21.9*  --  19.8* 18.4*  INR 1.8*  --  1.6* 1.5*  CREATININE 2.30*  --  2.35* 2.30*    Estimated Creatinine Clearance: 29 mL/min (A) (by C-G formula based on SCr of 2.3 mg/dL (H)).  Medical History: Past Medical History:  Diagnosis Date   Arrhythmia    atrial fibrillation   CHF (congestive heart failure) (HCC)    Chronic kidney disease    Coronary artery disease    Hyperlipidemia    Hypertension    Myocardial infarct [facility])     Medications:  See MAR  Assessment: 73 yo male s/p HM3 LVAD 09/2021 with recent CVA 02/10/24 where INR goal was increased to 2.5-3.  Recently bridged with lovenox  40 mg BID (LVAD protocol) from 6/20-6/24 now presenting with GIB.  Pharmacy consulted for warfarin dosing.  PTA warfarin regimen - 2 mg TuThSa, 4 mg all other days (22 mg/wk) per 6/26 office visit (2 mg tablets) No notable DDI's  Appeared to be therapeutic (INR ranging 2-2.2) on warfarin regimen of 4 mg Mon/Wed/Fri and 2 mg all other days.   S/p EGD and colonoscopy today - one non-bleeding ulcer clipped, no source of bleed identified.  GI and AHF OK with restarting anticoagulation with fixed heparin  bridge today.  INR 1.5 subtherapeutic as expected -  last dose warfarin 7/3.  Hgb 8.6, pltc 236 - last transfusion 7/7 (7 units total this admission).  LDH 134 stable.  Will be more conservative given patient was NPO today for procedures and INR jumped from 2.5 > 3.4 two days after restarting warfarin PTA dose 7/3 (poor PO intake + N/V at the time).    Goal of Therapy:  INR 2-2.5 Heparin  level <0.1 Monitor platelets by anticoagulation protocol: Yes   Plan:  Give warfarin 2 mg x 1 START Heparin  IV 500 units/hr - fixed rate, no titrations unless approved by HF MD Daily INR, CBC, heparin  level Monitor for s/sx of bleeding  Thank you for allowing pharmacy to participate in this patient's care,  Maurilio Fila, PharmD Clinical Pharmacist 03/29/2024  2:13 PM

## 2024-03-29 NOTE — Progress Notes (Signed)
 Physical Therapy Treatment Patient Details Name: Isaac [name] MRN: [id number] DOB: 1951/08/19 Today's Date: 03/29/2024   History of Present Illness Pt admitted 03/21/24 for suspected GIB. Wife states he fell at home. 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 Pt was subtherapeutic 6/20 and bridged with Lovenox  till 6/24. Pt also with right knee pain. EGD and colonoscopy 7/9.  PMH:  CHF    PT Comments  Pt admitted with above diagnosis. Pt was able to ambulate without device with CGA as pt did have 2 small LOB needing CGA/steadying assist with right knee pain causing imbalance. Pt feeling better today per pt. Wife did assist pt with his LVAD manipulation of equipment.  Plan to go to EGD today. Will continue to follow acutely.  Pt currently with functional limitations due to the deficits listed below (see PT Problem List). Pt will benefit from acute skilled PT to increase their independence and safety with mobility to allow discharge.       If plan is discharge home, recommend the following: A little help with walking and/or transfers;A lot of help with bathing/dressing/bathroom;Assist for transportation;Help with stairs or ramp for entrance;Direct supervision/assist for medications management   Can travel by private vehicle        Equipment Recommendations  None recommended by PT    Recommendations for Other Services       Precautions / Restrictions Precautions Precautions: Fall Precaution/Restrictions Comments: LVAD Restrictions Weight Bearing Restrictions Per Provider Order: No     Mobility  Bed Mobility               General bed mobility comments: Pt standing at sink on arrival looking for vest for his LVAD batteries.    Transfers Overall transfer level: Needs assistance Equipment used: None Transfers: Sit to/from Stand, Bed to chair/wheelchair/BSC Sit to Stand: Supervision           General  transfer comment: Does favor right knee therefore slow to rise.    Ambulation/Gait Ambulation/Gait assistance: Contact guard assist Gait Distance (Feet): 400 Feet Assistive device: None Gait Pattern/deviations: Decreased dorsiflexion - right, Decreased weight shift to right, Antalgic, Narrow base of support, Step-through pattern Gait velocity: dec Gait velocity interpretation: <1.31 ft/sec, indicative of household ambulator   General Gait Details: Pt able to ambulate CGA without device needing intermittent UE support as right knee pain caused pt to have 2 small LOB which pt self corrected however PT did support pt at elbow for added stability.   Stairs             Wheelchair Mobility     Tilt Bed    Modified Rankin (Stroke Patients Only)       Balance Overall balance assessment: Needs assistance Sitting-balance support: Feet supported, No upper extremity supported Sitting balance-Leahy Scale: Fair     Standing balance support: During functional activity, Single extremity supported, No upper extremity supported Standing balance-Leahy Scale: Fair Standing balance comment: can stand statically and dynamically without UE support or external support                            Communication Communication Communication: No apparent difficulties  Cognition Arousal: Alert Behavior During Therapy: WFL for tasks assessed/performed   PT - Cognitive impairments: Sequencing, Problem solving  PT - Cognition Comments: wife present and did cue pt for taking care of batteries Following commands: Impaired Following commands impaired: Only follows one step commands consistently    Cueing Cueing Techniques: Verbal cues  Exercises      General Comments General comments (skin integrity, edema, etc.): VSS and LVAD parameters WNL      Pertinent Vitals/Pain Pain Assessment Pain Assessment: Faces Faces Pain Scale: Hurts little  more Pain Location: right knee Pain Descriptors / Indicators: Aching, Discomfort, Grimacing, Guarding Pain Intervention(s): Limited activity within patient's tolerance, Monitored during session, Repositioned    Home Living                          Prior Function            PT Goals (current goals can now be found in the care plan section) Acute Rehab PT Goals Patient Stated Goal: didn't state Progress towards PT goals: Progressing toward goals    Frequency    Min 2X/week      PT Plan      Co-evaluation              AM-PAC PT 6 Clicks Mobility   Outcome Measure  Help needed turning from your back to your side while in a flat bed without using bedrails?: A Little Help needed moving from lying on your back to sitting on the side of a flat bed without using bedrails?: A Little Help needed moving to and from a bed to a chair (including a wheelchair)?: A Little Help needed standing up from a chair using your arms (e.g., wheelchair or bedside chair)?: A Little Help needed to walk in hospital room?: A Little Help needed climbing 3-5 steps with a railing? : A Lot 6 Click Score: 17    End of Session Equipment Utilized During Treatment: Gait belt Activity Tolerance: Patient tolerated treatment well Patient left: with call bell/phone within reach;with family/visitor present (sitting on EOB) Nurse Communication: Mobility status PT Visit Diagnosis: Unsteadiness on feet (R26.81)     Time: 9141-9070 PT Time Calculation (min) (ACUTE ONLY): 31 min  Charges:    $Gait Training: 23-37 mins PT General Charges $$ ACUTE PT VISIT: 1 Visit                     Shaira Sova M,PT Acute Rehab Services [phone]    Stephane JULIANNA Bevel 03/29/2024, 12:01 PM

## 2024-03-29 NOTE — Op Note (Signed)
 [facility] Patient Name: Isaac [name] Procedure Date : 03/29/2024 MRN: [id number] Attending MD: Victory CROME. Legrand , MD, [id number] Date of Birth: 1951-05-11 CSN: [id number] Age: 73 Admit Type: Inpatient Procedure:                GI endoscopy Indications:              Acute post hemorrhagic anemia, Melena                           Gastric/duodenal AVMs and friable duodenal mucosa                            treated with APC last week Providers:                Victory L. Legrand, MD, Particia Fischer, RN, Jasmine                            Petiford, Technician, Cena Currier, CRNA Referring MD:             Ezra CANDIE Shuck, MD Medicines:                Monitored Anesthesia Care Complications:            No immediate complications. Estimated Blood Loss:     Estimated blood loss: none. Procedure:                Pre-Anesthesia Assessment:                           - Prior to the procedure, a History and Physical                            was performed, and patient medications and                            allergies were reviewed. The patient's tolerance of                            previous anesthesia was also reviewed. The risks                            and benefits of the procedure and the sedation                            options and risks were discussed with the patient.                            All questions were answered, and informed consent                            was obtained. Prior Anticoagulants: The patient has                            taken Coumadin  (warfarin), last dose was 6 days  prior to procedure. ASA Grade Assessment: IV - A                            patient with severe systemic disease that is a                            constant threat to life. After reviewing the risks                            and benefits, the patient was deemed in                            satisfactory condition to undergo the procedure.                            After obtaining informed consent, the endoscope was                            passed under direct vision. Throughout the                            procedure, the patient's blood pressure, pulse, and                            oxygen saturations were monitored continuously. The                            [id number]-H190 ([id number]) Olympus endoscope was introduced                            through the mouth, and advanced to the second part                            of duodenum. The upper GI endoscopy was                            accomplished without difficulty. The patient                            tolerated the procedure well. Scope In: Scope Out: Findings:      The esophagus was normal.      One non-bleeding superficial gastric ulcer with pigmented material was       found in the gastric antrum. The lesion was 5 mm in largest dimension.       For hemostasis, one hemostatic clip was successfully placed (MR       conditional).      The exam of the stomach was otherwise normal.      The cardia and gastric fundus were normal on retroflexion.      The examined duodenum was normal.      No fresh or old blood in the UGIT Impression:               - Normal esophagus.                           -  Non-bleeding gastric ulcer with pigmented                            material. Clip (MR conditional) was placed. This                            ulcer is from the [facility] treatment applied to an AVM                            site on the most recent EGD.                           - Normal examined duodenum.                           - No specimens collected. Recommendation:           - Return patient to hospital ward for ongoing care.                           - See the other procedure note for documentation of                            additional recommendations. Procedure Code(s):        --- Professional ---                           [phone], Esophagogastroduodenoscopy, flexible,                             transoral; with control of bleeding, any method Diagnosis Code(s):        --- Professional ---                           K25.9, Gastric ulcer, unspecified as acute or                            chronic, without hemorrhage or perforation                           D62, Acute posthemorrhagic anemia                           K92.1, Melena (includes Hematochezia) CPT copyright 2022 [employer]. All rights reserved. The codes documented in this report are preliminary and upon coder review may  be revised to meet current compliance requirements. Dozier Berkovich L. Legrand, MD 03/29/2024 11:36:38 AM This report has been signed electronically. Number of Addenda: 0

## 2024-03-29 NOTE — Anesthesia Postprocedure Evaluation (Signed)
 Anesthesia Post Note  Patient: Isaac [name]  Procedure(s) Performed: COLONOSCOPY EGD (ESOPHAGOGASTRODUODENOSCOPY)     Patient location during evaluation: PACU Anesthesia Type: MAC Level of consciousness: awake Pain management: pain level controlled Vital Signs Assessment: post-procedure vital signs reviewed and stable Respiratory status: spontaneous breathing, nonlabored ventilation and respiratory function stable Cardiovascular status: stable and blood pressure returned to baseline Postop Assessment: no apparent nausea or vomiting Anesthetic complications: no   No notable events documented.  Last Vitals:  Vitals:   03/29/24 1150 03/29/24 1212  BP: (!) 82/73 100/81  Pulse: 61 60  Resp: 13 11  Temp:  36.4 C  SpO2: 100%     Last Pain:  Vitals:   03/29/24 1212  TempSrc: Axillary  PainSc: 0-No pain                 Delon Aisha Arch

## 2024-03-29 NOTE — Progress Notes (Signed)
 VAD Coordinator Procedure Note:   VAD Coordinator met patient in Endoscopy. Pt undergoing EGD/colonoscopy per Dr. Legrand. Hemodynamics and VAD parameters monitored by myself and anesthesia throughout the procedure. Blood pressures were obtained with automatic cuff on right arm.    Time: Doppler Auto  BP Flow PI Power Speed  Pre-procedure:  1030  100/75 (84) 4.5 3.2 3.9 5300                    Sedation Induction: 1058  93/70(76) 4.4 3.4 3.8 5300   1100  85/59(67) 4.3 3.4 3.7 5300   1115  97/73(82) 4.2 3.7 3.8 5300   1126  95/60(72) 4.2 3.8 3.9 5300                    Recovery Area: 1134  76/56(56) 4.5 3.4 3.7 5300   1145  85/70(77) 4.7 3.3 3.8 5300    Patient tolerated the procedure well. VAD Coordinator accompanied and remained with patient in recovery area.    Patient Disposition: pt transported back to 2c and handoff given to bedside nurse.  Lauraine Ip RN, BSN VAD Coordinator 24/7 Pager [phone]

## 2024-03-29 NOTE — Progress Notes (Cosign Needed Addendum)
 Patient ID: Isaac [name], male   DOB: 1951/04/24, 73 y.o.   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    S/p 2u PRBcs 7/1, 1 unit 7/2, 7/3, 7/5, 7/6 and 7/7.  Hgb 8.6 today   EGD showed 1 non bleeding superficial gastric ulcer, 1 clip placed. Colonoscopy unremarkable   INR 1.5   MAP 88   Feels ok. No complaints. Denies dyspnea.   LVAD Interrogation HM III: Speed: 5350 Flow: 4.5 PI: 3.7 Power: 3.5.  1PI event today   Objective:   Weight Range: 78.8 kg Body mass index is 25.65 kg/m.   Vital Signs:   Temp:  [97.1 F (36.2 C)-98.2 F (36.8 C)] 97.1 F (36.2 C) (07/09 1022) Pulse Rate:  [60-72] 72 (07/09 1022) Resp:  [12-20] 13 (07/09 1022) BP: (83-99)/(59-77) 93/72 (07/09 1022) SpO2:  [99 %-100 %] 100 % (07/09 1022) Weight:  [78.8 kg] 78.8 kg (07/09 1022) Last BM Date : 03/28/24  Weight change: Filed Weights   03/28/24 0650 03/29/24 0500 03/29/24 1022  Weight: 77.7 kg 78.8 kg 78.8 kg   Intake/Output:  No intake or output data in the 24 hours ending 03/29/24 1052   Physical Exam   General:  well appearing, sitting up in bed. No distress/ respiratory difficulty  Neck: supple. JVD not elevated.   Cor: RRR. Mechanical heart sounds with LVAD hum present. Lungs: CTAB  Driveline: C/D/I; securement device intact and driveline incorporated Extremities: no cyanosis, clubbing, rash, edema Neuro: alert & oriented x3. Moves all 4 extremities w/o difficulty. Affect pleasant   Telemetry   A-V paced 60s (Personally reviewed)    EKG    No new EKG to review  Labs  CBC Recent Labs    03/27/24 2106 03/28/24 0258 03/29/24 0458  WBC 5.8 4.4 4.6  NEUTROABS 4.5  --   --   HGB 8.5* 8.2* 8.6*  HCT 26.1* 25.3* 26.4*  MCV 89.1 88.5 89.5  PLT 196 209 236   Basic Metabolic Panel Recent Labs     03/28/24 0258 03/29/24 0458  NA 136 140  K 4.7 4.2  CL 107 108  CO2 21* 23  GLUCOSE 102* 86  BUN 58* 40*  CREATININE 2.35* 2.30*  CALCIUM  8.3* 8.4*   Liver Function Tests No results for input(s): AST, ALT, ALKPHOS, BILITOT, PROT, ALBUMIN  in the last 72 hours.  No results for input(s): LIPASE, AMYLASE in the last 72 hours. Cardiac Enzymes No results for input(s): CKTOTAL, CKMB, CKMBINDEX, TROPONINI in the last 72 hours.  BNP: BNP (last 3 results) Recent Labs    02/05/24 1106  BNP 343.8*   ProBNP (last 3 results) No results for input(s): PROBNP in the last 8760 hours.  D-Dimer No results for input(s): DDIMER in the last 72 hours. Hemoglobin A1C No results for input(s): HGBA1C in the last 72 hours. Fasting Lipid Panel No results for input(s): CHOL, HDL, LDLCALC, TRIG, CHOLHDL, LDLDIRECT in the last 72 hours. Thyroid  Function Tests No results for input(s): TSH, T4TOTAL, T3FREE, THYROIDAB in the last 72 hours.  Invalid input(s): FREET3  Other results:  Imaging  No results found.  Medications:   Scheduled Medications:  [MAR Hold] sodium chloride    Intravenous Once   [MAR Hold] Chlorhexidine  Gluconate Cloth  6 each Topical Daily   [MAR Hold] diclofenac   Sodium  2 g Topical QID   [MAR Hold] multivitamin with minerals  1 tablet Oral Daily   [MAR Hold] octreotide   100 mcg Subcutaneous Q12H   [MAR Hold] pantoprazole   40 mg Oral BID   [MAR Hold] polyethylene glycol  17 g Oral BID   [MAR Hold] rosuvastatin   10 mg Oral QHS   [MAR Hold] senna-docusate  1 tablet Oral QHS   [MAR Hold] sildenafil   20 mg Oral TID   [MAR Hold] Warfarin - Pharmacist Dosing Inpatient   Does not apply q1600    Infusions:  sodium chloride  500 mL (03/29/24 1025)    PRN Medications: sodium chloride , [MAR Hold] acetaminophen , [MAR Hold] ondansetron  (ZOFRAN ) IV Patient Profile   73 y.o. with history of CAD, HTN, atrial fibrillation, and  chronic systolic CHF s/p LVAD HM3. Presenting with GIB.   Assessment/Plan  1. GIB: Patient has epigastric pain, nausea, and melena x 1 week after getting Lovenox  for several days.  INR goal had been increased to 2.5-3 since his CVA. Reports lightheadedness. Hgb 6.4 on admission. Has had 6 units PRBCs so far.  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 Repeat EGD/Colonoscopy done today for continued drops in H/H. EGD showed 1 non bleeding superficial gastric ulcer, 1 clip placed. Colonoscopy unremarkable. Hgb stable today at 8.6 - monitor H/H w/ restart of anticoagulation. Per GI ok to start heparin  gtt for bridge   - Continue protonix  40 mg po bid - INR goal lowered to 2-2.5 => Have been holding warfarin. INR 1.5 today - Continue octreotide  100 mcg [place] bid.  - Recommended monthly octreotide  as outpatient, patient and wife want to think about this.   2. Chronic systolic CHF:  Long-standing cardiomyopathy.  [employer] dual chamber ICD.  Echo in 12/22 with EF < 20%, severe LV dilation, restrictive diastolic function, moderate RV dysfunction, moderate MR, mod-severe TR. Cause of cardiomyopathy is uncertain.  He has a history of CAD, but I do not think that the described CAD from the past could explain his cardiomyopathy, but CAD could have progressed.  He was admitted in 12/22 with cardiogenic shock and Impella 5.5. s/p HM III VAD on 09/17/22.  Post-op course complicated by AKI and RV failure. He was on HD for several months but is now off.  Echo in 7/24 showed EF < 20%, IV septum midline to slightly leftward, RV moderately dilated with moderately decreased systolic function, IVC dilated with severe TR.  Echo in 5/25 with EF 20-25%, mild LVH, LVAD present, mild RV dysfunction, moderate AI, mild RV dysfunction with normal RV size, severe TR.  Stable NYHA class II, MAP 70s-80s and LVAD parameters stable today. Not markedly volume overloaded.  - continue to hold  lasix  today, can likely restart tomorrow  - Continue sildenafil  20 mg TID - Restart Warfarin tonight + heparin  gtt. INR goal 2-2.5.   3. Tricuspid regurgitation:  Tricuspid repair not done at time of VAD due to proximity of ICD wires and hypotension during surgery. He has severe TR.  4. Atrial fibrillation: Paroxysmal.  He is off amiodarone  with resolution of nausea. Currently a-paced.  5. CKD stage IV: Suspect intra-op hypotension at the time of LVAD placement led to development of ATN => urine sediment looked like ATN per renal. Had initial CVVH and transitioned over to HD. He is now off HD.  Creatinine 2.3 today, stable.   - Holding Lasix .  6. CAD: History of PCI  to OM1 in 2007 and RCA in 2013.  No CP or ACs.  - Deferred cardiac cath in 12/22 due to AKI and plan for VAD - Continue Crestor .   7. Abnormal thyroid  function tests: Patient has had elevated TSH and free T4, mixed pattern. We have stopped amiodarone . 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  TSH normal this admission.  - He follows with endocrinology.   8. Nausea: This seems to have been due to amiodarone , resolved off amiodarone .  Now has recurred, possibly due to upper GI bleeding.  Resolved 9. 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  INR goal increased to 2.5-3 post-CVA, now goal decreased back to 2-2.5.  10. Right knee pain:  - Uric acid 7.5.  - appears to be pre-patellar bursitis, will follow for now  I reviewed the LVAD parameters from today, and compared the results to the patient's prior recorded data.  No programming changes were made.  The LVAD is functioning within specified parameters.  The patient performs LVAD self-test daily.  LVAD interrogation was negative for any significant power changes, alarms or PI events/speed drops.  LVAD equipment check completed and is in good working order.  Back-up equipment present.   LVAD education  done on emergency procedures and precautions and reviewed exit site care.   Caffie Shed PA-C  03/29/2024 10:52 AM  VAD Team Pager [phone] (7am - 7am)   Patient seen and examined with the above-signed Advanced Practice Provider and/or Housestaff. I personally reviewed laboratory data, imaging studies and relevant notes. I independently examined the patient and formulated the important aspects of the plan. I have edited the note to reflect any of my changes or salient points. I have personally discussed the plan with the patient and/or family.  Underwent EGD and colon this . Colon ok. 1 AVM in on upper treated with APC  Hgb stable.   General:  NAD.  HEENT: normal  Neck: supple. JVP not elevated.  Carotids 2+ bilat; no bruits. No lymphadenopathy or thryomegaly appreciated. Cor: LVAD hum.  Lungs: Clear. Abdomen: soft, nontender, non-distended. No hepatosplenomegaly. No bruits or masses. Good bowel sounds. Driveline site clean. Anchor in place.  Extremities: no cyanosis, clubbing, rash. Warm no edema  Neuro: alert & oriented x 3. No focal deficits. Moves all 4 without problem   Results of EGD d/w GI. Ok to start low-dose heparin  at 500u/hr (d/w PharmD)  Continue PPI  Watch hgb.   Start warfarin in am if hgb stable  VAD interrogated personally. Parameters stable.  Toribio Fuel, MD  12:24 AM

## 2024-03-29 NOTE — Op Note (Signed)
 [facility] Patient Name: Isaac [name] Procedure Date : 03/29/2024 MRN: [id number] Attending MD: Victory CROME. Legrand , MD, [id number] Date of Birth: 1951-01-24 CSN: [id number] Age: 73 Admit Type: Inpatient Procedure:                Colonoscopy Indications:              Melena, Acute post hemorrhagic anemia Providers:                Victory L. Legrand, MD, Particia Fischer, RN, Jasmine                            Petiford, Technician, Cena Currier, CRNA Referring MD:             Ezra CANDIE Shuck, MD Medicines:                Monitored Anesthesia Care Complications:            No immediate complications. Estimated Blood Loss:     Estimated blood loss: none. Procedure:                Pre-Anesthesia Assessment:                           - Prior to the procedure, a History and Physical                            was performed, and patient medications and                            allergies were reviewed. The patient's tolerance of                            previous anesthesia was also reviewed. The risks                            and benefits of the procedure and the sedation                            options and risks were discussed with the patient.                            All questions were answered, and informed consent                            was obtained. Prior Anticoagulants: The patient has                            taken Coumadin  (warfarin), last dose was 6 days                            prior to procedure. ASA Grade Assessment: IV - A                            patient with severe systemic disease that is a  constant threat to life. After reviewing the risks                            and benefits, the patient was deemed in                            satisfactory condition to undergo the procedure.                           After obtaining informed consent, the colonoscope                            was passed under direct vision. Throughout  the                            procedure, the patient's blood pressure, pulse, and                            oxygen saturations were monitored continuously. The                            CF-HQ190L ([id number]) Olympus coloscope was                            introduced through the anus and advanced to the the                            cecum, identified by the ileocecal valve and the                            cecal cap. The colonoscopy was performed without                            difficulty. The patient tolerated the procedure                            well. The quality of the bowel preparation was good                            in most areas, though fair in the cecal And                            proximal ascending colon with some retained opaque                            fluid and fibrous debris that cannot be completely                            cleared (see photos). The ileocecal valve, cecum                            and the rectum were photographed. Scope In: 11:13:56 AM Scope Out: 11:27:24 AM Scope Withdrawal Time: 0 hours 9  minutes 32 seconds  Total Procedure Duration: 0 hours 13 minutes 28 seconds  Findings:      The perianal and digital rectal examinations were normal.      Diverticula were found in the left colon.      Internal hemorrhoids were found. The hemorrhoids were small.      The exam was otherwise without abnormality on direct and retroflexion       views. Impression:               - Diverticulosis in the left colon.                           - Internal hemorrhoids.                           - The examination was otherwise normal on direct                            and retroflexion views.                           - No specimens collected.                           Some small areas of mucosa not completely                            visualized due to prep noted above, but overall                            visualization of the colon mucosa was good and no                             sources of blood loss were found. Recommendation:           - Return patient to hospital ward for ongoing care.                           - Cardiac diet.                           - Begin unfractionated heparin  and monitor in                            hospital to see if there is recurrence of bleeding.                           If so, video capsule study would be next.                           Daily CBC Procedure Code(s):        --- Professional ---                           [phone], Colonoscopy, flexible; diagnostic, including  collection of specimen(s) by brushing or washing,                            when performed (separate procedure) Diagnosis Code(s):        --- Professional ---                           K64.8, Other hemorrhoids                           K92.1, Melena (includes Hematochezia)                           D62, Acute posthemorrhagic anemia                           K57.30, Diverticulosis of large intestine without                            perforation or abscess without bleeding CPT copyright 2022 American [employer]. All rights reserved. The codes documented in this report are preliminary and upon coder review may  be revised to meet current compliance requirements. Markeda Narvaez L. Legrand, MD 03/29/2024 11:42:01 AM This report has been signed electronically. Number of Addenda: 0

## 2024-03-30 ENCOUNTER — Ambulatory Visit: Admitting: Speech Pathology

## 2024-03-30 ENCOUNTER — Encounter: Admitting: Occupational Therapy

## 2024-03-30 DIAGNOSIS — I5022 Chronic systolic (congestive) heart failure: Secondary | ICD-10-CM | POA: Diagnosis not present

## 2024-03-30 DIAGNOSIS — K922 Gastrointestinal hemorrhage, unspecified: Secondary | ICD-10-CM | POA: Diagnosis not present

## 2024-03-30 LAB — CBC
HCT: 25.4 % — ABNORMAL LOW (ref 39.0–52.0)
Hemoglobin: 8.1 g/dL — ABNORMAL LOW (ref 13.0–17.0)
MCH: 28.6 pg (ref 26.0–34.0)
MCHC: 31.9 g/dL (ref 30.0–36.0)
MCV: 89.8 fL (ref 80.0–100.0)
Platelets: 256 K/uL (ref 150–400)
RBC: 2.83 MIL/uL — ABNORMAL LOW (ref 4.22–5.81)
RDW: 17.2 % — ABNORMAL HIGH (ref 11.5–15.5)
WBC: 3.7 K/uL — ABNORMAL LOW (ref 4.0–10.5)
nRBC: 0 % (ref 0.0–0.2)

## 2024-03-30 LAB — MAGNESIUM: Magnesium: 2.1 mg/dL (ref 1.7–2.4)

## 2024-03-30 LAB — BASIC METABOLIC PANEL WITH GFR
Anion gap: 5 (ref 5–15)
BUN: 28 mg/dL — ABNORMAL HIGH (ref 8–23)
CO2: 26 mmol/L (ref 22–32)
Calcium: 8.4 mg/dL — ABNORMAL LOW (ref 8.9–10.3)
Chloride: 108 mmol/L (ref 98–111)
Creatinine, Ser: 1.93 mg/dL — ABNORMAL HIGH (ref 0.61–1.24)
GFR, Estimated: 36 mL/min — ABNORMAL LOW (ref >60)
Glucose, Bld: 111 mg/dL — ABNORMAL HIGH (ref 70–99)
Potassium: 4.3 mmol/L (ref 3.5–5.1)
Sodium: 139 mmol/L (ref 135–145)

## 2024-03-30 LAB — LACTATE DEHYDROGENASE: LDH: 126 U/L (ref 98–192)

## 2024-03-30 LAB — PROTIME-INR
INR: 1.4 — ABNORMAL HIGH (ref 0.8–1.2)
Prothrombin Time: 18 s — ABNORMAL HIGH (ref 11.4–15.2)

## 2024-03-30 LAB — HEPARIN LEVEL (UNFRACTIONATED): Heparin Unfractionated: <0.1 [IU]/mL — ABNORMAL LOW (ref 0.30–0.70)

## 2024-03-30 MED ORDER — WARFARIN SODIUM 4 MG PO TABS
4.0000 mg | ORAL_TABLET | Freq: Once | ORAL | Status: AC
  Administered 2024-03-30: 4 mg via ORAL
  Filled 2024-03-30: qty 1

## 2024-03-30 NOTE — Progress Notes (Signed)
 LVAD Coordinator Rounding Note:  Pt admitted 03/21/24 for suspected GIB. Pt states he started feeling poorly Sunday noting dizziness and generalized fatigue that has continued to worsen throughout the week. Yesterday morning his wife states he fell at home. Denies head trauma. 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 Pt was subtherapeutic 6/20 and bridged with Lovenox  till 6/24.   Hgb 6.4 on arrival pt received 2 PRBC. CT chest/abdomen/pelvis obtained overnight.GI consulted. Awaiting recommendations.   IMPRESSION: 1. No acute noncontrast CT abnormality of the chest, abdomen, or pelvis. No specific noncontrast CT findings to explain GI bleed. 2. Moderate burden of stool throughout the colon and rectum. 3. Emphysema and diffuse bilateral bronchial wall thickening. 4. Cardiomegaly and coronary artery disease. 5. Cholelithiasis. 6. Prostatomegaly.  Enteroscopy performed 03/22/24 . 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 Pt receiving SQ Octreotide . Will set outpatient injections upon discharge.  EGD/colonoscopy performed yesterday. EGD showed 1 non bleeding superficial gastric ulcer, 1 clip placed. Colonoscopy unremarkable   Pt lying in bed this morning. INR 1.4. pt was given Coumadin  2 mg last night. Currently on Heparin  gtt at 500 u/hr.  Vital signs: Temp: 97.6 HR: 62 Doppler Pressure: 76 Auto Cuff BP: 90/70 (78) O2 Sat: 96% on RA Wt: 175.9>175.1>170.6>171.3>172 lbs    LVAD interrogation reveals:  Speed: 5300 Flow: 4.5 Power: 3.7w PI: 3.5 Hematocrit: 25  Alarms: none Events: 3 today; 1 yesterday  Fixed speed: 5300 Low speed limit: 5000  Wife brought his back up bag- now on cart.  Drive Line:  Existing VAD dressing C/D/I with anchor intact and accurately applied. Weekly dressing changes per VAD coordinator or pts wife. Next dressing change due: 03/31/24 by  caregiver. Weekly kit on VAD cart.   Labs:  Hgb trend: 6.4>7.4>8.1>7.9>9.1>8.3>7.5>8.2>8.1  LDH trend: 124>102>107>119>110>133>126  INR trend: 2.2>2.3>2.2>2.5>1.8>1.6>1.4  Anticoagulation Plan: -INR Goal: 2-2.5 lowered this admission d/t GIB  Blood Products: 03/21/24: 2 PRBC 03/22/24: 1 PRBC 03/23/24: 1 PRBC 03/26/24: 1 PRBC 03/27/24: 1 PRBC  Device: - Cabin crew - V paced 60 -Therapies: OFF  Infection:   Plan/Recommendations:  Page VAD coordinator for drive line or equipment concerns 2. Weekly dressing changes per VAD Coordinator or caregiver.   Lauraine Ip RN VAD Coordinator  Office: [phone]  24/7 Pager: [phone]

## 2024-03-30 NOTE — Progress Notes (Signed)
 ANTICOAGULATION CONSULT NOTE  Pharmacy Consult for warfarin Indication: HM 3  Allergies  Allergen Reactions   Neosporin [Neomycin-Bacitracin Zn-Polymyx] Hives   Doxycycline  Nausea And Vomiting   Mushroom Extract Complex (Obsolete) Nausea And Vomiting   Tape Other (See Comments)    Some tapes/dressings can irritate the skin    Patient Measurements: Height: 5' 9 (175.3 cm) Weight: 78 kg (172 lb) IBW/kg (Calculated) : 70.7  Vital Signs: Temp: 97.6 F (36.4 C) (07/10 0716) Temp Source: Oral (07/10 0716) BP: 90/70 (07/10 0716) Pulse Rate: 60 (07/10 0400)  Labs: Recent Labs    03/28/24 0258 03/29/24 0458 03/29/24 2256 03/30/24 0410  HGB 8.2* 8.6*  --  8.1*  HCT 25.3* 26.4*  --  25.4*  PLT 209 236  --  256  LABPROT 19.8* 18.4*  --  18.0*  INR 1.6* 1.5*  --  1.4*  HEPARINUNFRC  --   --  <0.10* <0.10*  CREATININE 2.35* 2.30*  --  1.93*    Estimated Creatinine Clearance: 34.6 mL/min (A) (by C-G formula based on SCr of 1.93 mg/dL (H)).  Medical History: Past Medical History:  Diagnosis Date   Arrhythmia    atrial fibrillation   CHF (congestive heart failure) (HCC)    Chronic kidney disease    Coronary artery disease    Hyperlipidemia    Hypertension    Myocardial infarct [facility])     Medications:  See MAR  Assessment: 73 yo male s/p HM3 LVAD 09/2021 with recent CVA 02/10/24 where INR goal was increased to 2.5-3.  Recently bridged with lovenox  40 mg BID (LVAD protocol) from 6/20-6/24 now presenting with GIB.  Pharmacy consulted for warfarin dosing.  PTA warfarin regimen - 2 mg TuThSa, 4 mg all other days (22 mg/wk) per 6/26 office visit (2 mg tablets) No notable DDI's  Appeared to be therapeutic (INR ranging 2-2.2) on warfarin regimen of 4 mg Mon/Wed/Fri and 2 mg all other days.   S/p EGD and colonoscopy today - one non-bleeding ulcer clipped, no source of bleed identified.  GI and AHF OK with restarting anticoagulation with fixed heparin  bridge today.  Heparin   level undetectable, INR of 1.4 today is subtherapeutic as expected - warfarin restart 7/9 after holding for 5 days.  Hgb 8.1, pltc 236 - last transfusion 7/7 (7 units total this admission).  LDH 126 stable.  100% documented PO intake.    Goal of Therapy:  INR 2-2.5 Heparin  level <0.1 Monitor platelets by anticoagulation protocol: Yes   Plan:  Give warfarin 4 mg x 1 Continue heparin  IV 500 units/hr - fixed rate, no titrations unless approved by HF MD Daily INR, CBC, heparin  level Monitor for s/sx of bleeding  Thank you for allowing pharmacy to participate in this patient's care,  Maurilio Fila, PharmD Clinical Pharmacist 03/30/2024  10:49 AM

## 2024-03-30 NOTE — TOC Initial Note (Signed)
 Transition of Care [facility]) - Initial/Assessment Note    Patient Details  Name: Isaac [name] MRN: [id number] Date of Birth: [birth year]  Transition of Care [facility]) CM/SW Contact:    Justina Delcia Czar, RN Phone Number: [phone] 03/30/2024, 3:53 PM  Clinical Narrative:                 Spoke to pt and wife, gave permission to speak to wife. Pt independent pta. Pt has scale at home. Has RW as needed for ambulation. Pt was participating in outpt PT at [facility], will continue was medical stable to return.   Will continue to follow for dc needs.   Expected Discharge Plan: Home/Self Care Barriers to Discharge: Continued Medical Work up   Patient Goals and CMS Choice Patient states their goals for this hospitalization and ongoing recovery are:: wants to remain independent          Expected Discharge Plan and Services   Discharge Planning Services: CM Consult   Living arrangements for the past 2 months: Single Family Home                                      Prior Living Arrangements/Services Living arrangements for the past 2 months: Single Family Home Lives with:: Spouse Patient language and need for interpreter reviewed:: Yes Do you feel safe going back to the place where you live?: Yes      Need for Family Participation in Patient Care: No (Comment) Care giver support system in place?: Yes (comment) Current home services: DME (rolling walker, cane, scale, LVAD) Criminal Activity/Legal Involvement Pertinent to Current Situation/Hospitalization: No - Comment as needed  Activities of Daily Living   ADL Screening (condition at time of admission) Independently performs ADLs?: Yes (appropriate for developmental age) Is the patient deaf or have difficulty hearing?: No Does the patient have difficulty seeing, even when wearing glasses/contacts?: No Does the patient have difficulty concentrating, remembering, or making decisions?: No  Permission  Sought/Granted Permission sought to share information with : Case Manager, Family Supports, PCP Permission granted to share information with : Yes, Verbal Permission Granted  Share Information with NAME: Braiden Rodman  Permission granted to share info w AGENCY: DME  Permission granted to share info w Relationship: wife  Permission granted to share info w Contact Information: [phone]  Emotional Assessment Appearance:: Appears stated age Attitude/Demeanor/Rapport: Engaged Affect (typically observed): Accepting Orientation: : Oriented to Self, Oriented to Place, Oriented to  Time, Oriented to Situation   Psych Involvement: No (comment)  Admission diagnosis:  GI bleed [K92.2] Patient Active Problem List   Diagnosis Date Noted   Acute gastric ulcer with hemorrhage 03/29/2024   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   Melena 03/22/2024   ABLA (acute blood loss anemia) 03/22/2024   Anticoagulated 03/22/2024   AVM (arteriovenous malformation) of small bowel, acquired with hemorrhage 03/22/2024   Upper GI bleed 03/21/2024   Hematoma 02/17/2024   Anemia 02/15/2024   Hypotension 02/15/2024   Sundowning 02/11/2024   Insomnia 02/11/2024   Constipation 02/11/2024   Stage 3b chronic kidney disease (HCC) 02/11/2024   Cardioembolic stroke (HCC) 02/10/2024   Acute left PCA stroke (HCC) 02/05/2024   Abnormal thyroid  blood test 04/05/2023   Abscess of left groin 11/27/2022   VT (ventricular tachycardia) (HCC) 10/27/2022   ICD (implantable cardioverter-defibrillator) in place 10/27/2022   Pressure injury of skin 10/07/2021   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)    Protein-calorie malnutrition, severe 09/10/2021   Elevated TSH    PICC (peripherally inserted central catheter) in place    Cardiogenic shock (HCC)    Syncope 09/07/2021   CHF (congestive heart failure) (HCC) 09/06/2021   CAD (coronary artery disease) 08/22/2021   Cardiomyopathy (HCC) 08/22/2021   Mixed hyperlipidemia  08/22/2021   A-fib (HCC) 08/22/2021   Elevated troponin 08/22/2021   Acute on chronic combined systolic and diastolic CHF (congestive heart failure) (HCC) 08/22/2021   Benign essential hypertension 02/22/2015   Chronic systolic heart failure (HCC) 07/05/2014   PCP:  Valora Agent, MD Pharmacy:   [id number] [address], [place] - [address] [place] [address] Phone: [phone] Fax: [phone]  [facility] [address] [place] - [address] [place] [address] Phone: [phone] Fax: [phone]  [facility] [address] [place] [address] Phone: [phone] Fax: [phone]     Social Drivers of Health (SDOH) Social History: SDOH Screenings   Food Insecurity: No Food Insecurity (03/21/2024)  Housing: Low Risk  (03/21/2024)  Transportation Needs: No Transportation Needs (03/21/2024)  Utilities: Not At Risk (03/21/2024)  Depression (PHQ2-9): Low Risk  (04/14/2022)  Social Connections: Moderately Integrated (03/21/2024)  Recent Concern: Social Connections - Moderately Isolated (02/05/2024)  Tobacco Use: Medium Risk (03/29/2024)   SDOH Interventions:     Readmission Risk Interventions     No data to display

## 2024-03-30 NOTE — Plan of Care (Signed)
  Problem: Education: Goal: Patient will understand all VAD equipment and how it functions Outcome: Progressing   Problem: Cardiac: Goal: LVAD will function as expected and patient will experience no clinical alarms Outcome: Progressing   Problem: Education: Goal: Knowledge of General Education information will improve Description: Including pain rating scale, medication(s)/side effects and non-pharmacologic comfort measures Outcome: Progressing   Problem: Clinical Measurements: Goal: Will remain free from infection Outcome: Progressing

## 2024-03-30 NOTE — Progress Notes (Signed)
 Mobility Specialist Progress Note;   03/30/24 0852  Mobility  Activity Ambulated with assistance in hallway  Level of Assistance Contact guard assist, steadying assist  Assistive Device None  Distance Ambulated (ft) 400 ft  Activity Response Tolerated well  Mobility Referral Yes  Mobility visit 1 Mobility  Mobility Specialist Start Time (ACUTE ONLY) [id number]  Mobility Specialist Stop Time (ACUTE ONLY) 0909  Mobility Specialist Time Calculation (min) (ACUTE ONLY) 17 min   Pt agreeable to mobility. Able to handle LVAD equipment w/ min cues. Required MinG assistance during ambulation d/t 2x minor LOBs. VSS throughout. Pt returned back to bed with all needs met, call bell in reach.   Lauraine Erm Mobility Specialist Please contact via SecureChat or [employer] [phone]

## 2024-03-30 NOTE — Progress Notes (Addendum)
 Daily Progress Note  DOA: 03/21/2024 Hospital Day: 10   Cc: Persistent anemia  Brief History:  73 y.o. year old male with a medical history including but not limited to CAD, AFIB, moderate MR, mod-severe TR, chronic systolic CHF s/p LVAD, CKD 3, CVA. Patient admitted 7/1 with black tarry stools on Lovenox  ( overlapping with warfarin / epigastric discomfort. See GI consult 7/2   ASSESSMENT    PUD Upper GI bleed Anemia ) EGD yesterday remarkable for a non-bleeding gastric ulcer with pigmented material related to recent APC site.  Other than diverticulosis and hemorrhoids colonoscopy was normal >>>Today:  No BMs today / no overt GI bleeding Hgb down slightly overnight to 8.1 but stable overall  Non-bleeding gastric / duodenal / jejunal angiodysplastic lesions s/p APC on 03/22/24   Chronic systolic CHF, LVEF < 20  LVAD   Recent CVA in setting of AFIB.  Warfarin on hold   Principal Problem:   Upper GI bleed Active Problems:   Melena   ABLA (acute blood loss anemia)   Anticoagulated   AVM (arteriovenous malformation) of small bowel, acquired with hemorrhage   Gastric AVM   Acute gastric ulcer with hemorrhage   PLAN   --If he has recurrent bleeding with resumption of anticoagulant then small bowel video capsule study next step. -- Continue p.o. twice daily pantoprazole  for 4 weeks then once daily  --Hopefully able to get octreotide  as outpatient before history of gastric and small bowel AVMs   Subjective   No complaints.  No bowel movements or overt GI bleeding.  Tolerating diet   Objective   GI Studies:   03/29/2024 EGD and colonoscopy   EGD - Normal esophagus. - Non-bleeding gastric ulcer with pigmented material. Clip (MR conditional) was placed. This ulcer is from the [facility] treatment applied to an AVM site on the most recent EGD. - Normal examined duodenum. - No specimens collected.  Colonoscopy Prep good in most places, fair in the cecum and proximal  ascending colon. - Diverticulosis in the left colon. - Internal hemorrhoids. - The examination was otherwise normal on direct and retroflexion views. - No specimens collected.  Recent Labs    03/28/24 0258 03/29/24 0458 03/30/24 0410  WBC 4.4 4.6 3.7*  HGB 8.2* 8.6* 8.1*  HCT 25.3* 26.4* 25.4*  MCV 88.5 89.5 89.8  PLT 209 236 256   No results for input(s): FOLATE, VITAMINB12, FERRITIN, TIBC, IRONPCTSAT in the last 72 hours. Recent Labs    03/28/24 0258 03/29/24 0458 03/30/24 0410  NA 136 140 139  K 4.7 4.2 4.3  CL 107 108 108  CO2 21* 23 26  GLUCOSE 102* 86 111*  BUN 58* 40* 28*  CREATININE 2.35* 2.30* 1.93*  CALCIUM  8.3* 8.4* 8.4*   No results for input(s): PROT, ALBUMIN , AST, ALT, ALKPHOS, BILITOT, BILIDIR, IBILI in the last 72 hours.    Imaging:  CT CHEST ABDOMEN PELVIS WO CONTRAST CLINICAL DATA:  Concern for GI bleed * Tracking Code: BO *  EXAM: CT CHEST, ABDOMEN AND PELVIS WITHOUT CONTRAST  TECHNIQUE: Multidetector CT imaging of the chest, abdomen and pelvis was performed following the standard protocol without IV contrast.  RADIATION DOSE REDUCTION: This exam was performed according to the departmental dose-optimization program which includes automated exposure control, adjustment of the mA and/or kV according to patient size and/or use of iterative reconstruction technique.  COMPARISON:  09/09/2021  FINDINGS: CT CHEST FINDINGS  Cardiovascular: Aortic atherosclerosis. Cardiomegaly. Three-vessel coronary artery calcifications. LVAD. No  pericardial effusion.  Mediastinum/Nodes: No enlarged mediastinal, hilar, or axillary lymph nodes. Thyroid  gland, trachea, and esophagus demonstrate no significant findings.  Lungs/Pleura: Moderate centrilobular emphysema. Diffuse bilateral bronchial wall thickening. No pleural effusion or pneumothorax.  Musculoskeletal: No chest wall abnormality. No acute osseous findings.  CT ABDOMEN  PELVIS FINDINGS  Hepatobiliary: No solid liver abnormality is seen. Gallstones. No gallbladder wall thickening, or biliary dilatation.  Pancreas: Unremarkable. No pancreatic ductal dilatation or surrounding inflammatory changes.  Spleen: Normal in size without significant abnormality.  Adrenals/Urinary Tract: Unchanged, definitively benign small bilateral adrenal adenomata, for which no further follow-up or characterization is required. Kidneys are normal, without renal calculi, solid lesion, or hydronephrosis. Bladder is unremarkable.  Stomach/Bowel: Stomach is within normal limits. Appendix appears normal. No evidence of bowel wall thickening, distention, or inflammatory changes. Moderate burden of stool throughout the colon and rectum.  Vascular/Lymphatic: Aortic atherosclerosis. No enlarged abdominal or pelvic lymph nodes.  Reproductive: Prostatomegaly.  Other: Fat containing left inguinal hernia.  No ascites.  Musculoskeletal: No acute osseous findings.  IMPRESSION: 1. No acute noncontrast CT abnormality of the chest, abdomen, or pelvis. No specific noncontrast CT findings to explain GI bleed. 2. Moderate burden of stool throughout the colon and rectum. 3. Emphysema and diffuse bilateral bronchial wall thickening. 4. Cardiomegaly and coronary artery disease. 5. Cholelithiasis. 6. Prostatomegaly.  Aortic Atherosclerosis (ICD10-I70.0) and Emphysema (ICD10-J43.9).  Electronically Signed   By: Marolyn JONETTA Jaksch M.D.   On: 03/21/2024 21:51 DG CHEST PORT 1 VIEW CLINICAL DATA:  Heart failure.  LVAD.  EXAM: PORTABLE CHEST 1 VIEW  COMPARISON:  Chest x-ray 02/05/2024  FINDINGS: Sternal wires again identified. Left upper chest battery pack with defibrillator leads along the heart. Is also a left ventricular assist device overlying the apex of heart on the left side. Heart itself is enlarged with some vascular congestion. No pneumothorax or effusion. No consolidation.  Overlapping cardiac leads. Surgical changes along the right upper chest.  IMPRESSION: Postop chest with enlarged heart and vascular congestion. Defibrillator and left ventricular assist device.  Electronically Signed   By: Ranell Bring M.D.   On: 03/21/2024 17:21     Scheduled inpatient medications:   sodium chloride    Intravenous Once   diclofenac  Sodium  2 g Topical QID   multivitamin with minerals  1 tablet Oral Daily   octreotide   100 mcg Subcutaneous Q12H   pantoprazole   40 mg Oral BID   polyethylene glycol  17 g Oral BID   rosuvastatin   10 mg Oral QHS   senna-docusate  1 tablet Oral QHS   sildenafil   20 mg Oral TID   warfarin  4 mg Oral ONCE-1600   Warfarin - Pharmacist Dosing Inpatient   Does not apply q1600   Continuous inpatient infusions:   heparin  500 Units/hr (03/30/24 0449)   PRN inpatient medications: acetaminophen , ondansetron  (ZOFRAN ) IV  Vital signs in last 24 hours: Temp:  [97.6 F (36.4 C)-98.2 F (36.8 C)] 97.7 F (36.5 C) (07/10 1215) Pulse Rate:  [56-60] 60 (07/10 0400) Resp:  [10-20] 17 (07/10 1215) BP: (82-91)/(62-76) 85/65 (07/10 1215) SpO2:  [96 %-100 %] 96 % (07/10 1215) Weight:  [78 kg] 78 kg (07/10 0500) Last BM Date : 03/29/24  Intake/Output Summary (Last 24 hours) at 03/30/2024 1500 Last data filed at 03/30/2024 1100 Gross per 24 hour  Intake 667.75 ml  Output 500 ml  Net 167.75 ml    Intake/Output from previous day: 07/09 0701 - 07/10 0700 In: 407.8 [P.O.:240; I.V.:167.8] Out: 500 [  Urine:500] Intake/Output this shift: Total I/O In: 360 [P.O.:360] Out: -    Physical Exam:  General: Alert male in NAD Heart: LVAD hum .  Pulmonary: Normal respiratory effort Abdomen: Soft, nondistended, nontender. Normal bowel sounds. Extremities: No lower extremity edema  Neurologic: Alert and oriented Psych: Pleasant. Cooperative     LOS: 9 days   Vina Dasen ,NP 03/30/2024, 3:00 PM  I have taken an interval history, thoroughly  reviewed the chart and examined the patient. I agree with the Advanced Practitioner's note, impression and recommendations, and have recorded additional findings, impressions and recommendations below. I performed a substantive portion of this encounter (>50% time spent), including a complete performance of the medical decision making.  My additional thoughts are as follows:   No recurrence of bleeding 24 hours on IV heparin  after endoscopic treatments.  Coumadin  being resumed today, hopefully things remain stable.  Hemoglobin stable today  Call us  if rebleeding.  Should that occur, our next step would be small bowel video capsule study.  Victory LITTIE Brand III Office:[phone]

## 2024-03-30 NOTE — Progress Notes (Addendum)
 Patient ID: Isaac [name], male   DOB: 11-Sep-1951, 73 y.o.   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    S/p 2u PRBcs 7/1, 1 unit 7/2, 7/3, 7/5, 7/6 and 7/7.  Hgb 8.6 today   EGD showed 1 non bleeding superficial gastric ulcer, 1 clip placed. Colonoscopy unremarkable   INR 1.4  MAP 70s  Feels fine this morning. Has not had BM since yesterday.   LVAD Interrogation HM III: Speed: 5300 Flow: 4.5 PI: 3.4 Power: 3.7.  x3 PI events today   Objective:   Weight Range: 78 kg Body mass index is 25.4 kg/m.   Vital Signs:   Temp:  [97.1 F (36.2 C)-98.2 F (36.8 C)] 97.6 F (36.4 C) (07/10 0716) Pulse Rate:  [56-72] 60 (07/10 0400) Resp:  [10-20] 20 (07/10 0716) BP: (82-103)/(62-81) 90/70 (07/10 0716) SpO2:  [96 %-100 %] 96 % (07/10 0716) Weight:  [78 kg-78.8 kg] 78 kg (07/10 0500) Last BM Date : 03/29/24  Weight change: Filed Weights   03/29/24 0500 03/29/24 1022 03/30/24 0500  Weight: 78.8 kg 78.8 kg 78 kg   Intake/Output:   Intake/Output Summary (Last 24 hours) at 03/30/2024 1016 Last data filed at 03/30/2024 0449 Gross per 24 hour  Intake 407.75 ml  Output 500 ml  Net -92.25 ml     Physical Exam   General:  Well appearing. No resp difficulty Neck: supple. JVP flat.  Cor: Mechanical heart sounds with LVAD hum present. Lungs: Clear Driveline: C/D/I; securement device intact and driveline incorporated Extremities: no cyanosis, clubbing, rash, edema Neuro: alert & oriented x3. Moves all 4 extremities w/o difficulty. Affect pleasant   Telemetry   A-V paced 60s (Personally reviewed)    EKG    No new EKG to review  Labs  CBC Recent Labs    03/27/24 2106 03/28/24 0258 03/29/24 0458 03/30/24 0410  WBC 5.8   < > 4.6 3.7*  NEUTROABS 4.5  --   --   --   HGB 8.5*   < > 8.6* 8.1*   HCT 26.1*   < > 26.4* 25.4*  MCV 89.1   < > 89.5 89.8  PLT 196   < > 236 256   < > = values in this interval not displayed.   Basic Metabolic Panel Recent Labs    92/90/74 0458 03/30/24 0410  NA 140 139  K 4.2 4.3  CL 108 108  CO2 23 26  GLUCOSE 86 111*  BUN 40* 28*  CREATININE 2.30* 1.93*  CALCIUM  8.4* 8.4*   Liver Function Tests No results for input(s): AST, ALT, ALKPHOS, BILITOT, PROT, ALBUMIN  in the last 72 hours.  No results for input(s): LIPASE, AMYLASE in the last 72 hours. Cardiac Enzymes No results for input(s): CKTOTAL, CKMB, CKMBINDEX, TROPONINI in the last 72 hours.  BNP: BNP (last 3 results) Recent Labs    02/05/24 1106  BNP 343.8*   ProBNP (last 3 results) No results for input(s): PROBNP in the last 8760 hours.  D-Dimer No results for input(s): DDIMER in the last 72 hours. Hemoglobin A1C No results for input(s): HGBA1C in the last 72 hours. Fasting Lipid Panel No results for input(s): CHOL, HDL, LDLCALC, TRIG, CHOLHDL, LDLDIRECT in the last 72 hours. Thyroid  Function Tests No results for input(s): TSH, T4TOTAL, T3FREE,  THYROIDAB in the last 72 hours.  Invalid input(s): FREET3  Other results:  Imaging  No results found.  Medications:   Scheduled Medications:  sodium chloride    Intravenous Once   diclofenac  Sodium  2 g Topical QID   multivitamin with minerals  1 tablet Oral Daily   octreotide   100 mcg Subcutaneous Q12H   pantoprazole   40 mg Oral BID   polyethylene glycol  17 g Oral BID   rosuvastatin   10 mg Oral QHS   senna-docusate  1 tablet Oral QHS   sildenafil   20 mg Oral TID   Warfarin - Pharmacist Dosing Inpatient   Does not apply q1600    Infusions:  heparin  500 Units/hr (03/30/24 0449)    PRN Medications: acetaminophen , ondansetron  (ZOFRAN ) IV Patient Profile   74 y.o. with history of CAD, HTN, atrial fibrillation, and chronic systolic CHF s/p LVAD HM3. Presenting with  GIB.   Assessment/Plan  1. GIB: Patient has epigastric pain, nausea, and melena x 1 week after getting Lovenox  for several days.  INR goal had been increased to 2.5-3 since his CVA. Reports lightheadedness. Hgb 6.4 on admission. Has had 6 units PRBCs so far.  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 Repeat EGD/Colonoscopy done today for continued drops in H/H. EGD 7/9 showed 1 non bleeding superficial gastric ulcer, 1 clip placed. Colonoscopy unremarkable. Hgb stable today at 8.1 - Follow H/H w/ restart of anticoagulation 7/9. Per GI ok for heparin  gtt for bridge   - Continue protonix  40 mg po bid - INR goal lowered to 2-2.5. INR 1.4 today - Continue octreotide  100 mcg Condon bid.  - Recommended monthly octreotide  as outpatient, patient and wife want to think about this.    2. Chronic systolic CHF:  Long-standing cardiomyopathy.  [employer] dual chamber ICD.  Echo in 12/22 with EF < 20%, severe LV dilation, restrictive diastolic function, moderate RV dysfunction, moderate MR, mod-severe TR. Cause of cardiomyopathy is uncertain.  He has a history of CAD, but I do not think that the described CAD from the past could explain his cardiomyopathy, but CAD could have progressed.  He was admitted in 12/22 with cardiogenic shock and Impella 5.5. s/p HM III VAD on 09/17/22.  Post-op course complicated by AKI and RV failure. He was on HD for several months but is now off.  Echo in 7/24 showed EF < 20%, IV septum midline to slightly leftward, RV moderately dilated with moderately decreased systolic function, IVC dilated with severe TR.  Echo in 5/25 with EF 20-25%, mild LVH, LVAD present, mild RV dysfunction, moderate AI, mild RV dysfunction with normal RV size, severe TR.  Stable NYHA class II, MAP 70s and LVAD parameters stable today. Not markedly volume overloaded.  - continue to hold lasix  today with recent bowel prep, can likely restart tomorrow - Continue  sildenafil  20 mg TID - Continue Warfarin + heparin  gtt. INR goal 2-2.5. 1.4 today.   3. Tricuspid regurgitation:  Tricuspid repair not done at time of VAD due to proximity of ICD wires and hypotension during surgery. He has severe TR.   4. Atrial fibrillation: Paroxysmal.  He is off amiodarone  with resolution of nausea. Currently a-paced.   5. CKD stage IV: Suspect intra-op hypotension at the time of LVAD placement led to development of ATN => urine sediment looked like ATN per renal. Had initial CVVH and transitioned over to HD. He is now off HD.  Creatinine 1.93 today, stable.   - Holding Lasix .   6. CAD: History of PCI to OM1 in 2007 and RCA in 2013.  No CP or ACs.  - Deferred cardiac cath in 12/22 due to AKI and plan for VAD - Continue Crestor .    7. Abnormal thyroid  function tests: Patient has had elevated TSH and free T4, mixed pattern. We have stopped amiodarone . 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  TSH normal this admission.  - He follows with endocrinology.    8. Nausea: This seems to have been due to amiodarone , resolved off amiodarone .  Now has recurred, possibly due to upper GI bleeding.  Resolved  9. 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  INR goal increased to 2.5-3 post-CVA, now goal decreased back to 2-2.5.   10. Right knee pain:  - Uric acid 7.5.  - appears to be pre-patellar bursitis, will follow for now  I reviewed the LVAD parameters from today, and compared the results to the patient's prior recorded data.  No programming changes were made.  The LVAD is functioning within specified parameters.  The patient performs LVAD self-test daily.  LVAD interrogation was negative for any significant power changes, alarms or PI events/speed drops.  LVAD equipment check completed and is in good working order.  Back-up equipment present.   LVAD education done on emergency procedures and  precautions and reviewed exit site care.   Beckey LITTIE Coe AGACNP-BC  03/30/2024 10:16 AM  VAD Team Pager [phone] (7am - 7am)   Patient seen and examined with the above-signed Advanced Practice Provider and/or Housestaff. I personally reviewed laboratory data, imaging studies and relevant notes. I independently examined the patient and formulated the important aspects of the plan. I have edited the note to reflect any of my changes or salient points. I have personally discussed the plan with the patient and/or family.  S/p EGD/colon yesterday. On low-dose heparin  No BMs overnight. Denies ab pain. Hgb stable  General:  NAD.  HEENT: normal  Neck: supple. JVP not elevated.  Carotids 2+ bilat; no bruits. No lymphadenopathy or thryomegaly appreciated. Cor: LVAD hum.  Lungs: Clear. Abdomen: soft, nontender, non-distended. No hepatosplenomegaly. No bruits or masses. Good bowel sounds. Driveline site clean. Anchor in place.  Extremities: no cyanosis, clubbing, rash. Warm no edema  Neuro: alert & oriented x 3. No focal deficits. Moves all 4 without problem   No recurrent bleeding after multiple APCs. Tolerating low-dose heparin .   Will restart warfarin )ok with GI). Discussed warfarin dosing with PharmD personally.  Volume status ok   VAD interrogated personally. Parameters stable.

## 2024-03-31 DIAGNOSIS — K922 Gastrointestinal hemorrhage, unspecified: Secondary | ICD-10-CM | POA: Diagnosis not present

## 2024-03-31 DIAGNOSIS — I5022 Chronic systolic (congestive) heart failure: Secondary | ICD-10-CM | POA: Diagnosis not present

## 2024-03-31 LAB — BASIC METABOLIC PANEL WITH GFR
Anion gap: 5 (ref 5–15)
BUN: 25 mg/dL — ABNORMAL HIGH (ref 8–23)
CO2: 22 mmol/L (ref 22–32)
Calcium: 8.1 mg/dL — ABNORMAL LOW (ref 8.9–10.3)
Chloride: 110 mmol/L (ref 98–111)
Creatinine, Ser: 1.83 mg/dL — ABNORMAL HIGH (ref 0.61–1.24)
GFR, Estimated: 39 mL/min — ABNORMAL LOW (ref >60)
Glucose, Bld: 107 mg/dL — ABNORMAL HIGH (ref 70–99)
Potassium: 4.3 mmol/L (ref 3.5–5.1)
Sodium: 137 mmol/L (ref 135–145)

## 2024-03-31 LAB — PROTIME-INR
INR: 1.4 — ABNORMAL HIGH (ref 0.8–1.2)
Prothrombin Time: 18.1 s — ABNORMAL HIGH (ref 11.4–15.2)

## 2024-03-31 LAB — CBC
HCT: 25.2 % — ABNORMAL LOW (ref 39.0–52.0)
HCT: 26.2 % — ABNORMAL LOW (ref 39.0–52.0)
Hemoglobin: 7.9 g/dL — ABNORMAL LOW (ref 13.0–17.0)
Hemoglobin: 8.6 g/dL — ABNORMAL LOW (ref 13.0–17.0)
MCH: 28.5 pg (ref 26.0–34.0)
MCH: 29.1 pg (ref 26.0–34.0)
MCHC: 31.3 g/dL (ref 30.0–36.0)
MCHC: 32.8 g/dL (ref 30.0–36.0)
MCV: 91 fL (ref 80.0–100.0)
MCV: 88.5 fL (ref 80.0–100.0)
Platelets: 248 K/uL (ref 150–400)
Platelets: 230 K/uL (ref 150–400)
RBC: 2.77 MIL/uL — ABNORMAL LOW (ref 4.22–5.81)
RBC: 2.96 MIL/uL — ABNORMAL LOW (ref 4.22–5.81)
RDW: 16.6 % — ABNORMAL HIGH (ref 11.5–15.5)
RDW: 16.7 % — ABNORMAL HIGH (ref 11.5–15.5)
WBC: 5.2 K/uL (ref 4.0–10.5)
WBC: 4.7 K/uL (ref 4.0–10.5)
nRBC: 0 % (ref 0.0–0.2)
nRBC: 0 % (ref 0.0–0.2)

## 2024-03-31 LAB — HEPARIN LEVEL (UNFRACTIONATED): Heparin Unfractionated: <0.1 [IU]/mL — ABNORMAL LOW (ref 0.30–0.70)

## 2024-03-31 LAB — LACTATE DEHYDROGENASE: LDH: 123 U/L (ref 98–192)

## 2024-03-31 LAB — PREPARE RBC (CROSSMATCH)

## 2024-03-31 MED ORDER — SORBITOL 70 % SOLN: 30.0000 mL | Freq: Once | Status: DC

## 2024-03-31 MED ORDER — WARFARIN SODIUM 4 MG PO TABS
4.0000 mg | ORAL_TABLET | Freq: Once | ORAL | Status: AC
  Administered 2024-03-31: 4 mg via ORAL
  Filled 2024-03-31: qty 1

## 2024-03-31 MED ORDER — SODIUM CHLORIDE 0.9% IV SOLUTION: Freq: Once | INTRAVENOUS | Status: DC

## 2024-03-31 NOTE — Progress Notes (Signed)
 LVAD Coordinator Rounding Note:  Pt admitted 03/21/24 for suspected GIB. Pt states he started feeling poorly Sunday noting dizziness and generalized fatigue that has continued to worsen throughout the week. Yesterday morning his wife states he fell at home. Denies head trauma. 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 Pt was subtherapeutic 6/20 and bridged with Lovenox  till 6/24.   Hgb 6.4 on arrival pt received 2 PRBC. CT chest/abdomen/pelvis obtained overnight.GI consulted. Awaiting recommendations.   IMPRESSION: 1. No acute noncontrast CT abnormality of the chest, abdomen, or pelvis. No specific noncontrast CT findings to explain GI bleed. 2. Moderate burden of stool throughout the colon and rectum. 3. Emphysema and diffuse bilateral bronchial wall thickening. 4. Cardiomegaly and coronary artery disease. 5. Cholelithiasis. 6. Prostatomegaly.  Enteroscopy performed 03/22/24 . 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 Pt receiving SQ Octreotide . Will set outpatient injections upon discharge.  EGD/colonoscopy performed yesterday. EGD showed 1 non bleeding superficial gastric ulcer, 1 clip placed. Colonoscopy unremarkable   Pt lying in bed this morning. INR 1.4. pt was given Coumadin  4 mg last night. Currently on Heparin  gtt at 500 u/hr.  Vital signs: Temp: 97.9 HR: 60 Doppler Pressure: 72 Auto Cuff BP: 84/69 (76) O2 Sat: 97% on RA Wt: 175.9>175.1>170.6>171.3>172>174.7 lbs    LVAD interrogation reveals:  Speed: 5300 Flow: 4.5 Power: 3.8w PI: 3.4 Hematocrit: 25  Alarms: none Events: none today; 3 yesterday  Fixed speed: 5300 Low speed limit: 5000  Wife brought his back up bag- now on cart.  Drive Line:  pts caregiver changed today. Weekly dressing changes per VAD coordinator or pts wife. Next dressing change due: 04/07/24 by caregiver.   Labs:  Hgb trend:  6.4>7.4>8.1>7.9>9.1>8.3>7.5>8.2>8.1>7.9  LDH trend: 124>102>107>119>110>133>126>123  INR trend: 2.2>2.3>2.2>2.5>1.8>1.6>1.4  Anticoagulation Plan: -INR Goal: 2-2.5 lowered this admission d/t GIB  Blood Products: 03/21/24: 2 PRBC 03/22/24: 1 PRBC 03/23/24: 1 PRBC 03/26/24: 1 PRBC 03/27/24: 1 PRBC  Device: - Cabin crew - V paced 60 -Therapies: OFF  Infection:   Plan/Recommendations:  Page VAD coordinator for drive line or equipment concerns 2. Weekly dressing changes per VAD Coordinator or caregiver.   Lauraine Ip RN VAD Coordinator  Office: [phone]  24/7 Pager: [phone]

## 2024-03-31 NOTE — Progress Notes (Signed)
 ANTICOAGULATION CONSULT NOTE  Pharmacy Consult for warfarin Indication: HM 3  Allergies  Allergen Reactions   Neosporin [Neomycin-Bacitracin Zn-Polymyx] Hives   Doxycycline  Nausea And Vomiting   Mushroom Extract Complex (Obsolete) Nausea And Vomiting   Tape Other (See Comments)    Some tapes/dressings can irritate the skin    Patient Measurements: Height: 5' 9 (175.3 cm) Weight: 79.2 kg (174 lb 11.2 oz) IBW/kg (Calculated) : 70.7  Vital Signs: Temp: 98 F (36.7 C) (07/11 0400) Temp Source: Oral (07/11 0332) BP: 83/67 (07/11 0332) Pulse Rate: 60 (07/11 0000)  Labs: Recent Labs    03/29/24 0458 03/29/24 2256 03/30/24 0410 03/31/24 0331  HGB 8.6*  --  8.1* 7.9*  HCT 26.4*  --  25.4* 25.2*  PLT 236  --  256 248  LABPROT 18.4*  --  18.0* 18.1*  INR 1.5*  --  1.4* 1.4*  HEPARINUNFRC  --  <0.10* <0.10* <0.10*  CREATININE 2.30*  --  1.93* 1.83*    Estimated Creatinine Clearance: 36.5 mL/min (A) (by C-G formula based on SCr of 1.83 mg/dL (H)).  Medical History: Past Medical History:  Diagnosis Date   Arrhythmia    atrial fibrillation   CHF (congestive heart failure) (HCC)    Chronic kidney disease    Coronary artery disease    Hyperlipidemia    Hypertension    Myocardial infarct [facility])     Medications:  See MAR  Assessment: 73 yo male s/p HM3 LVAD 09/2021 with recent CVA 02/10/24 where INR goal was increased to 2.5-3.  Recently bridged with lovenox  40 mg BID (LVAD protocol) from 6/20-6/24 now presenting with GIB.  Pharmacy consulted for warfarin dosing.  PTA warfarin regimen - 2 mg TuThSa, 4 mg all other days (22 mg/wk) per 6/26 office visit (2 mg tablets) No notable DDI's  Appeared to be therapeutic (INR ranging 2-2.2) on warfarin regimen of 4 mg Mon/Wed/Fri and 2 mg all other days.   S/p EGD and colonoscopy 7/9 - one non-bleeding ulcer clipped, no source of bleed identified.  GI and AHF OK with restarting anticoagulation with fixed heparin  bridge  7/9.  Heparin  level undetectable, INR of 1.4 today is unchanged subtherapeutic as expected after 2 days of warfarin dosing (restarted 7/9 after holding for 5 days, gave boosted dose yesterday, 7/10).  Hgb 7.9 (drifting down), pltc 248 stable.  Giving a unit of blood today to keep Hgb >8 (8u PRBC total this admission).  LDH 123 stable.  100% documented PO intake.    Goal of Therapy:  INR 2-2.5 Heparin  level <0.1 Monitor platelets by anticoagulation protocol: Yes   Plan:  Give warfarin 4 mg x 1  Continue heparin  IV 500 units/hr - fixed rate, no titrations unless approved by HF MD Daily INR, CBC, heparin  level Monitor for s/sx of bleeding  Thank you for allowing pharmacy to participate in this patient's care,  Maurilio Fila, PharmD Clinical Pharmacist 03/31/2024  7:10 AM

## 2024-03-31 NOTE — Progress Notes (Addendum)
 Patient ID: Isaac [name], male   DOB: 17-Jun-1951, 73 y.o.   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    S/p 2u PRBcs 7/1, 1 unit 7/2, 7/3, 7/5, 7/6 and 7/7.  Hgb 8.6 today   EGD 7/9 showed 1 non bleeding superficial gastric ulcer, 1 clip placed. Colonoscopy unremarkable   INR 1.4. Hgb 7.9  MAP 70s  Feels ok. Tired of being on heparin  and warfarin with no change in INR. No BM last 2 days.   LVAD Interrogation HM III: Speed: 5300 Flow: 4.4 PI: 3.8 Power: 3.7.  No PI events today   Objective:   Weight Range: 79.2 kg Body mass index is 25.8 kg/m.   Vital Signs:   Temp:  [97.7 F (36.5 C)-98.1 F (36.7 C)] 98.1 F (36.7 C) (07/11 0757) Pulse Rate:  [60-62] 60 (07/11 0757) Resp:  [13-17] 14 (07/11 0757) BP: (83-95)/(62-76) 84/62 (07/11 0800) SpO2:  [96 %-98 %] 98 % (07/11 0757) Weight:  [79.2 kg] 79.2 kg (07/11 0500) Last BM Date : 03/29/24  Weight change: Filed Weights   03/29/24 1022 03/30/24 0500 03/31/24 0500  Weight: 78.8 kg 78 kg 79.2 kg   Intake/Output:   Intake/Output Summary (Last 24 hours) at 03/31/2024 0930 Last data filed at 03/31/2024 0400 Gross per 24 hour  Intake 475.91 ml  Output 300 ml  Net 175.91 ml     Physical Exam   General:  Well appearing. No resp difficulty Neck: supple. JVP flat.  Cor: Mechanical heart sounds with LVAD hum present. Lungs: Clear Driveline: C/D/I; securement device intact and driveline incorporated Extremities: no cyanosis, clubbing, rash, edema Neuro: alert & oriented x3. Moves all 4 extremities w/o difficulty. Affect pleasant   Telemetry   A-V paced 60s (Personally reviewed)    EKG    No new EKG to review  Labs  CBC Recent Labs    03/30/24 0410 03/31/24 0331  WBC 3.7* 5.2  HGB 8.1* 7.9*  HCT 25.4* 25.2*  MCV 89.8 91.0  PLT 256  248   Basic Metabolic Panel Recent Labs    92/89/74 0410 03/31/24 0331  NA 139 137  K 4.3 4.3  CL 108 110  CO2 26 22  GLUCOSE 111* 107*  BUN 28* 25*  CREATININE 1.93* 1.83*  CALCIUM  8.4* 8.1*  MG 2.1  --    Liver Function Tests No results for input(s): AST, ALT, ALKPHOS, BILITOT, PROT, ALBUMIN  in the last 72 hours.  No results for input(s): LIPASE, AMYLASE in the last 72 hours. Cardiac Enzymes No results for input(s): CKTOTAL, CKMB, CKMBINDEX, TROPONINI in the last 72 hours.  BNP: BNP (last 3 results) Recent Labs    02/05/24 1106  BNP 343.8*   ProBNP (last 3 results) No results for input(s): PROBNP in the last 8760 hours.  D-Dimer No results for input(s): DDIMER in the last 72 hours. Hemoglobin A1C No results for input(s): HGBA1C in the last 72 hours. Fasting Lipid Panel No results for input(s): CHOL, HDL, LDLCALC, TRIG, CHOLHDL, LDLDIRECT in the last 72 hours. Thyroid  Function Tests No results for input(s): TSH, T4TOTAL, T3FREE, THYROIDAB in the last 72 hours.  Invalid input(s): FREET3  Other results:  Imaging  No results found.  Medications:   Scheduled Medications:  sodium chloride    Intravenous Once   diclofenac   Sodium  2 g Topical QID   multivitamin with minerals  1 tablet Oral Daily   octreotide   100 mcg Subcutaneous Q12H   pantoprazole   40 mg Oral BID   polyethylene glycol  17 g Oral BID   rosuvastatin   10 mg Oral QHS   senna-docusate  1 tablet Oral QHS   sildenafil   20 mg Oral TID   Warfarin - Pharmacist Dosing Inpatient   Does not apply q1600    Infusions:  heparin  500 Units/hr (03/30/24 0449)    PRN Medications: acetaminophen , ondansetron  (ZOFRAN ) IV Patient Profile   73 y.o. with history of CAD, HTN, atrial fibrillation, and chronic systolic CHF s/p LVAD HM3. Presenting with GIB.   Assessment/Plan  1. GIB: Patient has epigastric pain, nausea, and melena x 1 week after getting  Lovenox  for several days.  INR goal had been increased to 2.5-3 since his CVA. Reports lightheadedness. Hgb 6.4 on admission. Has had 6 units PRBCs so far (will be 7 after today).  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 Repeat EGD/Colonoscopy done today for continued drops in H/H. EGD 7/9 showed 1 non bleeding superficial gastric ulcer, 1 clip placed. Colonoscopy unremarkable.  - Hgb stable today at 7.9 today. Will give 1 uPRBCs.  - Follow H/H w/ restart of anticoagulation 7/9. Per GI ok for heparin  gtt for bridge   - Continue protonix  40 mg po bid - INR goal lowered to 2-2.5. INR 1.4 today - Continue octreotide  100 mcg [place] bid.  - Recommended monthly octreotide  as outpatient, patient and wife want to think about this.    2. Chronic systolic CHF:  Long-standing cardiomyopathy.  [employer] dual chamber ICD.  Echo in 12/22 with EF < 20%, severe LV dilation, restrictive diastolic function, moderate RV dysfunction, moderate MR, mod-severe TR. Cause of cardiomyopathy is uncertain.  He has a history of CAD, but I do not think that the described CAD from the past could explain his cardiomyopathy, but CAD could have progressed.  He was admitted in 12/22 with cardiogenic shock and Impella 5.5. s/p HM III VAD on 09/17/22.  Post-op course complicated by AKI and RV failure. He was on HD for several months but is now off.  Echo in 7/24 showed EF < 20%, IV septum midline to slightly leftward, RV moderately dilated with moderately decreased systolic function, IVC dilated with severe TR.  Echo in 5/25 with EF 20-25%, mild LVH, LVAD present, mild RV dysfunction, moderate AI, mild RV dysfunction with normal RV size, severe TR.  Stable NYHA class II, MAP 70s and LVAD parameters stable today. Not markedly volume overloaded.  - continue to hold lasix  today  - Continue sildenafil  20 mg TID - Continue Warfarin + heparin  gtt. INR goal 2-2.5. 1.4 today.   3. Tricuspid  regurgitation:  Tricuspid repair not done at time of VAD due to proximity of ICD wires and hypotension during surgery. He has severe TR.   4. Atrial fibrillation: Paroxysmal.  He is off amiodarone  with resolution of nausea. Currently a-paced.   5. CKD stage IV: Suspect intra-op hypotension at the time of LVAD placement led to development of ATN => urine sediment looked like ATN per renal. Had initial CVVH and transitioned over to HD. He is now off HD.  Creatinine 1.93 today, stable.   - Holding Lasix .   6. CAD: History of PCI to OM1 in 2007 and RCA in 2013.  No CP or ACs.  -  Deferred cardiac cath in 12/22 due to AKI and plan for VAD - Continue Crestor .    7. Abnormal thyroid  function tests: Patient has had elevated TSH and free T4, mixed pattern. We have stopped amiodarone . 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  TSH normal this admission.  - He follows with endocrinology.    8. Nausea: This seems to have been due to amiodarone , resolved off amiodarone .  Now has recurred, possibly due to upper GI bleeding.  Resolved  9. 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  INR goal increased to 2.5-3 post-CVA, now goal decreased back to 2-2.5.   10. Right knee pain:  - Uric acid 7.5.  - appears to be pre-patellar bursitis, will follow for now  11. GI - No BM last 2 days. Has been refusing miralax  and senokot here and there - Will order sorbitol  for today  I reviewed the LVAD parameters from today, and compared the results to the patient's prior recorded data.  No programming changes were made.  The LVAD is functioning within specified parameters.  The patient performs LVAD self-test daily.  LVAD interrogation was negative for any significant power changes, alarms or PI events/speed drops.  LVAD equipment check completed and is in good working order.  Back-up equipment present.   LVAD education done on emergency  procedures and precautions and reviewed exit site care.   Beckey LITTIE Coe AGACNP-BC  03/31/2024 9:30 AM  VAD Team Pager [phone] (7am - 7am)   Patient seen and examined with the above-signed Advanced Practice Provider and/or Housestaff. I personally reviewed laboratory data, imaging studies and relevant notes. I independently examined the patient and formulated the important aspects of the plan. I have edited the note to reflect any of my changes or salient points. I have personally discussed the plan with the patient and/or family.  Feels ok. Had normal BM today (after bowel regimen given above). No ab pain.   On heparin /warfarin. INR 1.4   Hgb 7.9.   Volume status ok   General:  NAD.  HEENT: normal  Neck: supple. JVP not elevated.  Carotids 2+ bilat; no bruits. No lymphadenopathy or thryomegaly appreciated. Cor: LVAD hum.  Lungs: Clear. Abdomen:  soft, nontender, non-distended. No hepatosplenomegaly. No bruits or masses. Good bowel sounds. Driveline site clean. Anchor in place.  Extremities: no cyanosis, clubbing, rash. Warm no edema  Neuro: alert & oriented x 3. No focal deficits. Moves all 4 without problem   No s/s of ongoing bleeding. With hgb 7.9 with give 1u RBCs.   Continue heparin /warfarin. Discussed warfarin dosing with PharmD personally.  Continue PPI.   VAD interrogated personally. Parameters stable.  Home when INR 1.7 or greater and hgb stable.   Toribio Fuel, MD  4:23 PM

## 2024-04-01 DIAGNOSIS — I5022 Chronic systolic (congestive) heart failure: Secondary | ICD-10-CM | POA: Diagnosis not present

## 2024-04-01 DIAGNOSIS — K922 Gastrointestinal hemorrhage, unspecified: Secondary | ICD-10-CM | POA: Diagnosis not present

## 2024-04-01 LAB — TYPE AND SCREEN
ABO/RH(D): O POS
Antibody Screen: NEGATIVE
Unit division: 0

## 2024-04-01 LAB — BASIC METABOLIC PANEL WITH GFR
Anion gap: 5 (ref 5–15)
BUN: 22 mg/dL (ref 8–23)
CO2: 23 mmol/L (ref 22–32)
Calcium: 8.2 mg/dL — ABNORMAL LOW (ref 8.9–10.3)
Chloride: 111 mmol/L (ref 98–111)
Creatinine, Ser: 1.95 mg/dL — ABNORMAL HIGH (ref 0.61–1.24)
GFR, Estimated: 36 mL/min — ABNORMAL LOW (ref >60)
Glucose, Bld: 98 mg/dL (ref 70–99)
Potassium: 4.7 mmol/L (ref 3.5–5.1)
Sodium: 139 mmol/L (ref 135–145)

## 2024-04-01 LAB — PROTIME-INR
INR: 1.5 — ABNORMAL HIGH (ref 0.8–1.2)
Prothrombin Time: 19.1 s — ABNORMAL HIGH (ref 11.4–15.2)

## 2024-04-01 LAB — BPAM RBC
Blood Product Expiration Date: 202508092359
ISSUE DATE / TIME: 202507111443
Unit Type and Rh: 5100

## 2024-04-01 LAB — CBC
HCT: 27 % — ABNORMAL LOW (ref 39.0–52.0)
Hemoglobin: 8.6 g/dL — ABNORMAL LOW (ref 13.0–17.0)
MCH: 28.6 pg (ref 26.0–34.0)
MCHC: 31.9 g/dL (ref 30.0–36.0)
MCV: 89.7 fL (ref 80.0–100.0)
Platelets: 239 K/uL (ref 150–400)
RBC: 3.01 MIL/uL — ABNORMAL LOW (ref 4.22–5.81)
RDW: 16.7 % — ABNORMAL HIGH (ref 11.5–15.5)
WBC: 4.9 K/uL (ref 4.0–10.5)
nRBC: 0 % (ref 0.0–0.2)

## 2024-04-01 LAB — HEPARIN LEVEL (UNFRACTIONATED): Heparin Unfractionated: <0.1 [IU]/mL — ABNORMAL LOW (ref 0.30–0.70)

## 2024-04-01 LAB — LACTATE DEHYDROGENASE: LDH: 123 U/L (ref 98–192)

## 2024-04-01 MED ORDER — WARFARIN SODIUM 4 MG PO TABS
4.0000 mg | ORAL_TABLET | Freq: Once | ORAL | Status: AC
  Administered 2024-04-01: 4 mg via ORAL
  Filled 2024-04-01: qty 1

## 2024-04-01 NOTE — Progress Notes (Signed)
 ANTICOAGULATION CONSULT NOTE  Pharmacy Consult for warfarin Indication: HM 3  Allergies  Allergen Reactions   Neosporin [Neomycin-Bacitracin Zn-Polymyx] Hives   Doxycycline  Nausea And Vomiting   Mushroom Extract Complex (Obsolete) Nausea And Vomiting   Tape Other (See Comments)    Some tapes/dressings can irritate the skin    Patient Measurements: Height: 5' 9 (175.3 cm) Weight: 79.2 kg (174 lb 9.7 oz) IBW/kg (Calculated) : 70.7  Vital Signs: Temp: 98.2 F (36.8 C) (07/12 1159) Temp Source: Oral (07/12 1159) BP: 93/70 (07/12 1200) Pulse Rate: 69 (07/12 0400)  Labs: Recent Labs    03/30/24 0410 03/31/24 0331 03/31/24 2047 04/01/24 0217  HGB 8.1* 7.9* 8.6* 8.6*  HCT 25.4* 25.2* 26.2* 27.0*  PLT 256 248 230 239  LABPROT 18.0* 18.1*  --  19.1*  INR 1.4* 1.4*  --  1.5*  HEPARINUNFRC <0.10* <0.10*  --  <0.10*  CREATININE 1.93* 1.83*  --  1.95*    Estimated Creatinine Clearance: 34.2 mL/min (A) (by C-G formula based on SCr of 1.95 mg/dL (H)).  Medical History: Past Medical History:  Diagnosis Date   Arrhythmia    atrial fibrillation   CHF (congestive heart failure) (HCC)    Chronic kidney disease    Coronary artery disease    Hyperlipidemia    Hypertension    Myocardial infarct [place])     Medications:  See MAR  Assessment: 73 yo male s/p HM3 LVAD 09/2021 with recent CVA 02/10/24 where INR goal was increased to 2.5-3.  Recently bridged with lovenox  40 mg BID (LVAD protocol) from 6/20-6/24 now presenting with GIB.  Pharmacy consulted for warfarin dosing.  PTA warfarin regimen - 2 mg TuThSa, 4 mg all other days (22 mg/wk) per 6/26 office visit (2 mg tablets) No notable DDI's Appeared to be therapeutic (INR ranging 2-2.2) on warfarin regimen of 4 mg Mon/Wed/Fri and 2 mg all other days.   S/p EGD and colonoscopy 7/9 - one non-bleeding ulcer clipped, no source of bleed identified.  GI and AHF OK with restarting anticoagulation with fixed heparin  bridge  7/9.  Heparin  level undetectable, INR of 1.5 today is slight higher , but reamins subtherapeutic as expected after 3 days of warfarin dosing (restarted 7/9 after holding for 5 days).  Hgb 7.9 (drifting down), pltc 248 stable. LDH 123 stable.  100% documented PO intake, no new drug interactions. Will give another boosted dose today to trend toward therapeutic INR.   Goal of Therapy:  INR 2-2.5 Heparin  level <0.1 Monitor platelets by anticoagulation protocol: Yes   Plan:  Give warfarin 4 mg x 1  Continue heparin  IV 500 units/hr - fixed rate, no titrations unless approved by HF MD Daily INR, CBC, heparin  level Monitor for s/sx of bleeding  Thank you for allowing pharmacy to participate in this patient's care,  Nidia Schaffer, PharmD PGY2 Cardiology Pharmacy Resident  Please check AMION for all [facility] Pharmacy phone numbers After 10:00 PM, call Main Pharmacy [phone] 04/01/2024  12:42 PM

## 2024-04-01 NOTE — Progress Notes (Signed)
 Patient ID: Isaac [name], male   DOB: 1951/07/30, 73 y.o.   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    S/p 2u PRBcs 7/1, 1 unit 7/2, 7/3, 7/5, 7/6 and 7/7.  Hgb 8.6 today   EGD 7/9 showed 1 non bleeding superficial gastric ulcer, 1 clip placed. Colonoscopy unremarkable   Feels better today. No CP or SOB.   On heparin  and warfarin  Says he had 2 BMs overnight that were mixed. No blood. No ab pain  HGb stable 8.2  LVAD Interrogation HM III: Speed: 5300 Flow: 4.5 PI: 3.4 Power: 4.0. VAD interrogated personally. Parameters stable.  Objective:   Weight Range: 79.2 kg Body mass index is 25.78 kg/m.   Vital Signs:   Temp:  [97.7 F (36.5 C)-98.6 F (37 C)] 98.2 F (36.8 C) (07/12 0831) Pulse Rate:  [60-69] 69 (07/12 0400) Resp:  [14-20] 20 (07/12 0831) BP: (82-97)/(60-78) 92/77 (07/12 0831) SpO2:  [94 %-98 %] 97 % (07/12 0831) Weight:  [79.2 kg] 79.2 kg (07/12 0705) Last BM Date : 04/01/24  Weight change: Filed Weights   03/30/24 0500 03/31/24 0500 04/01/24 0705  Weight: 78 kg 79.2 kg 79.2 kg   Intake/Output:   Intake/Output Summary (Last 24 hours) at 04/01/2024 1112 Last data filed at 04/01/2024 0800 Gross per 24 hour  Intake 497.73 ml  Output 300 ml  Net 197.73 ml   MAPs 70-80   Physical Exam   General:  NAD.  HEENT: normal  Neck: supple. JVP not elevated.  Carotids 2+ bilat; no bruits. No lymphadenopathy or thryomegaly appreciated. Cor: LVAD hum.  Lungs: Clear. Abdomen: soft, nontender, non-distended. No hepatosplenomegaly. No bruits or masses. Good bowel sounds. Driveline site clean. Anchor in place.  Extremities: no cyanosis, clubbing, rash. Warm no edema  Neuro: alert & oriented x 3. No focal deficits. Moves all 4 without problem   Telemetry   A-V paced 60s  (Personally  reviewed  Labs  CBC Recent Labs    03/31/24 2047 04/01/24 0217  WBC 4.7 4.9  HGB 8.6* 8.6*  HCT 26.2* 27.0*  MCV 88.5 89.7  PLT 230 239   Basic Metabolic Panel Recent Labs    92/89/74 0410 03/31/24 0331 04/01/24 0217  NA 139 137 139  K 4.3 4.3 4.7  CL 108 110 111  CO2 26 22 23   GLUCOSE 111* 107* 98  BUN 28* 25* 22  CREATININE 1.93* 1.83* 1.95*  CALCIUM  8.4* 8.1* 8.2*  MG 2.1  --   --    Liver Function Tests No results for input(s): AST, ALT, ALKPHOS, BILITOT, PROT, ALBUMIN  in the last 72 hours.  No results for input(s): LIPASE, AMYLASE in the last 72 hours. Cardiac Enzymes No results for input(s): CKTOTAL, CKMB, CKMBINDEX, TROPONINI in the last 72 hours.  BNP: BNP (last 3 results) Recent Labs    02/05/24 1106  BNP 343.8*   ProBNP (last 3 results) No results for input(s): PROBNP in the last 8760 hours.  D-Dimer No results for input(s): DDIMER in the last 72 hours. Hemoglobin A1C No results for input(s): HGBA1C in the last 72 hours. Fasting Lipid Panel No results for input(s): CHOL, HDL, LDLCALC, TRIG, CHOLHDL, LDLDIRECT in the last 72 hours. Thyroid  Function Tests No results for input(s): TSH, T4TOTAL, T3FREE, THYROIDAB in the last 72  hours.  Invalid input(s): FREET3  Other results:  Imaging  No results found.  Medications:   Scheduled Medications:  sodium chloride    Intravenous Once   sodium chloride    Intravenous Once   diclofenac  Sodium  2 g Topical QID   multivitamin with minerals  1 tablet Oral Daily   octreotide   100 mcg Subcutaneous Q12H   pantoprazole   40 mg Oral BID   polyethylene glycol  17 g Oral BID   rosuvastatin   10 mg Oral QHS   senna-docusate  1 tablet Oral QHS   sildenafil   20 mg Oral TID   sorbitol   30 mL Oral Once   Warfarin - Pharmacist Dosing Inpatient   Does not apply q1600    Infusions:  heparin  500 Units/hr (03/31/24 1813)    PRN Medications: acetaminophen ,  ondansetron  (ZOFRAN ) IV Patient Profile   73 y.o. with history of CAD, HTN, atrial fibrillation, and chronic systolic CHF s/p LVAD HM3. Presenting with GIB.   Assessment/Plan   1. GIB: Patient has epigastric pain, nausea, and melena x 1 week after getting Lovenox  for several days.  INR goal had been increased to 2.5-3 since his CVA. Reports lightheadedness. Hgb 6.4 on admission. Has had 6 units PRBCs so far (will be 7 after today).  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 Repeat EGD/Colonoscopy done today for continued drops in H/H. EGD 7/9 showed 1 non bleeding superficial gastric ulcer, 1 clip placed. Colonoscopy unremarkable.  - hgb stable at 8.2  - No evidence ongoing bleed. Continue heparin /warfarin - Continue protonix  40 mg po bid - INR goal lowered to 2-2.5. INR 1.5 today - Continue octreotide  100 mcg [place] bid.  - Recommended monthly octreotide  as outpatient, patient and wife want to think about this.    2. Chronic systolic CHF:  Long-standing cardiomyopathy.  [employer] dual chamber ICD.  Echo in 12/22 with EF < 20%, severe LV dilation, restrictive diastolic function, moderate RV dysfunction, moderate MR, mod-severe TR. Cause of cardiomyopathy is uncertain.  He has a history of CAD, but I do not think that the described CAD from the past could explain his cardiomyopathy, but CAD could have progressed.  He was admitted in 12/22 with cardiogenic shock and Impella 5.5. s/p HM III VAD on 09/17/22.  Post-op course complicated by AKI and RV failure. He was on HD for several months but is now off.  Echo in 7/24 showed EF < 20%, IV septum midline to slightly leftward, RV moderately dilated with moderately decreased systolic function, IVC dilated with severe TR.  Echo in 5/25 with EF 20-25%, mild LVH, LVAD present, mild RV dysfunction, moderate AI, mild RV dysfunction with normal RV size, severe TR.  Stable NYHA class II, MAP 70s and LVAD parameters  stable today. Not markedly volume overloaded.  - volume ok. Continue to hold lasix  - Continue sildenafil  20 mg TID - Continue Warfarin + heparin  gtt. INR goal 2-2.5. 1.5 today.. Can go home when INR 1.7 or greater. Discussed warfarin dosing with PharmD personally.  3. Tricuspid regurgitation:  Tricuspid repair not done at time of VAD due to proximity of ICD wires and hypotension during surgery. He has severe TR.   4. Atrial fibrillation: Paroxysmal.  He is off amiodarone  with resolution of nausea. Currently a-paced.  - Remains in NSR  5. CKD stage IV: Suspect intra-op hypotension at the time of LVAD placement led to development of ATN => urine sediment looked  like ATN per renal. Had initial CVVH and transitioned over to HD. He is now off HD.  Creatinine stable 1.95 today  - Holding Lasix .   6. CAD: History of PCI to OM1 in 2007 and RCA in 2013.  No CP or ACs.  - Deferred cardiac cath in 12/22 due to AKI and plan for VAD - Continue Crestor .    7. Abnormal thyroid  function tests: Patient has had elevated TSH and free T4, mixed pattern. We have stopped amiodarone . 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  TSH normal this admission.  - He follows with endocrinology.    8. 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  INR goal increased to 2.5-3 post-CVA, now goal decreased back to 2-2.5.   9. Right knee pain:  - Uric acid 7.5.  - appears to be pre-patellar bursitis -> improved   Toribio Fuel AGMD 04/01/2024 11:12 AM  VAD Team Pager [phone] (7am - 7am)

## 2024-04-01 NOTE — Progress Notes (Signed)
 Physical Therapy Treatment Patient Details Name: Isaac [name] MRN: [id number] DOB: 05/19/1951 Today's Date: 04/01/2024   History of Present Illness Pt admitted 03/21/24 for suspected GIB. Wife states he fell at home. 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 Pt was subtherapeutic 6/20 and bridged with Lovenox  till 6/24. Pt also with right knee pain. EGD and colonoscopy 7/9.  PMH:  CHF    PT Comments  Nearly mod I with all tasks reviewed today. Needs a few cues for awareness with lines/leads when mobilizing out of bed, assisted with management but otherwise ambulating without an assistive device and tolerating dynamic challenges well. Reviewed standing LE exercises. Encouraged OOB often and walk with staff to preserve functional capacity. Patient will continue to benefit from skilled physical therapy services to further improve independence with functional mobility. Can probably sign off if doing well next visit and can safely navigate stairs.     If plan is discharge home, recommend the following: Assist for transportation;Help with stairs or ramp for entrance;Direct supervision/assist for medications management;A little help with bathing/dressing/bathroom   Can travel by private vehicle        Equipment Recommendations  None recommended by PT    Recommendations for Other Services       Precautions / Restrictions Precautions Precautions: Fall Recall of Precautions/Restrictions: Intact Precaution/Restrictions Comments: LVAD Restrictions Weight Bearing Restrictions Per Provider Order: No     Mobility  Bed Mobility Overal bed mobility: Modified Independent             General bed mobility comments: Able to rise without physical assist and return to bed. Extra time.    Transfers Overall transfer level: Needs assistance Equipment used: None Transfers: Sit to/from Stand Sit to Stand: Supervision            General transfer comment: Supervision for safety and cues for awareness of lines/leads. Assisted with management only.    Ambulation/Gait Ambulation/Gait assistance: Supervision Gait Distance (Feet): 400 Feet Assistive device: None Gait Pattern/deviations: Decreased dorsiflexion - right, Narrow base of support, Step-through pattern, Decreased stride length, Wide base of support Gait velocity: dec Gait velocity interpretation: 1.31 - 2.62 ft/sec, indicative of limited community ambulator   General Gait Details: Grossly stable without assistive device. Tolerated dynamic challenges including direction changing, quick turns, heel walk, toe walk, marching (mild instability) and variable speeds. No overt LOB or buckling. VSS throughout.   Stairs             Wheelchair Mobility     Tilt Bed    Modified Rankin (Stroke Patients Only)       Balance Overall balance assessment: Modified Independent                               Standardized Balance Assessment Standardized Balance Assessment : Dynamic Gait Index   Dynamic Gait Index Level Surface: Normal Change in Gait Speed: Normal Gait with Horizontal Head Turns: Normal Gait with Vertical Head Turns: Normal Gait and Pivot Turn: Mild Impairment Step Over Obstacle: Mild Impairment Step Around Obstacles: Normal Steps: Mild Impairment Total Score: 21      Communication Communication Communication: No apparent difficulties  Cognition Arousal: Alert Behavior During Therapy: WFL for tasks assessed/performed   PT - Cognitive impairments: Sequencing, Problem solving  PT - Cognition Comments: Slight delay, some minor inaccuracies with higher level direction following. Following commands: Impaired Following commands impaired: Follows multi-step commands with increased time, Follows multi-step commands inconsistently    Cueing Cueing Techniques: Verbal cues  Exercises Other  Exercises Other Exercises: Standing hip abduction, hip extension, squats, and heel raises x10 each hands supported on hallway rail, CGA for safety.    General Comments General comments (skin integrity, edema, etc.): VSS, good control of LVAD set-up.      Pertinent Vitals/Pain Pain Assessment Pain Assessment: No/denies pain    Home Living                          Prior Function            PT Goals (current goals can now be found in the care plan section) Acute Rehab PT Goals Patient Stated Goal: didn't state PT Goal Formulation: With patient/family Time For Goal Achievement: 04/10/24 Potential to Achieve Goals: Good Progress towards PT goals: Progressing toward goals    Frequency    Min 2X/week      PT Plan      Co-evaluation              AM-PAC PT 6 Clicks Mobility   Outcome Measure  Help needed turning from your back to your side while in a flat bed without using bedrails?: None Help needed moving from lying on your back to sitting on the side of a flat bed without using bedrails?: None Help needed moving to and from a bed to a chair (including a wheelchair)?: A Little Help needed standing up from a chair using your arms (e.g., wheelchair or bedside chair)?: A Little Help needed to walk in hospital room?: A Little Help needed climbing 3-5 steps with a railing? : A Little 6 Click Score: 20    End of Session Equipment Utilized During Treatment:  (LVAD kit) Activity Tolerance: Patient tolerated treatment well Patient left: with call bell/phone within reach;in bed;with bed alarm set Nurse Communication: Mobility status PT Visit Diagnosis: Unsteadiness on feet (R26.81)     Time: 8475-8450 PT Time Calculation (min) (ACUTE ONLY): 25 min  Charges:    $Gait Training: 8-22 mins $Therapeutic Exercise: 8-22 mins PT General Charges $$ ACUTE PT VISIT: 1 Visit                     Leontine Roads, PT, DPT [facility]  Rehabilitation  Services Physical Therapist Office: [phone] Website: [email or web address]    Leontine GORMAN Roads 04/01/2024, 5:22 PM

## 2024-04-02 DIAGNOSIS — K922 Gastrointestinal hemorrhage, unspecified: Secondary | ICD-10-CM | POA: Diagnosis not present

## 2024-04-02 DIAGNOSIS — I5022 Chronic systolic (congestive) heart failure: Secondary | ICD-10-CM | POA: Diagnosis not present

## 2024-04-02 LAB — LACTATE DEHYDROGENASE: LDH: 128 U/L (ref 98–192)

## 2024-04-02 LAB — BASIC METABOLIC PANEL WITH GFR
Anion gap: 5 (ref 5–15)
BUN: 20 mg/dL (ref 8–23)
CO2: 22 mmol/L (ref 22–32)
Calcium: 8.4 mg/dL — ABNORMAL LOW (ref 8.9–10.3)
Chloride: 110 mmol/L (ref 98–111)
Creatinine, Ser: 1.9 mg/dL — ABNORMAL HIGH (ref 0.61–1.24)
GFR, Estimated: 37 mL/min — ABNORMAL LOW (ref >60)
Glucose, Bld: 88 mg/dL (ref 70–99)
Potassium: 4.2 mmol/L (ref 3.5–5.1)
Sodium: 137 mmol/L (ref 135–145)

## 2024-04-02 LAB — CBC
HCT: 27.5 % — ABNORMAL LOW (ref 39.0–52.0)
Hemoglobin: 8.8 g/dL — ABNORMAL LOW (ref 13.0–17.0)
MCH: 28.9 pg (ref 26.0–34.0)
MCHC: 32 g/dL (ref 30.0–36.0)
MCV: 90.2 fL (ref 80.0–100.0)
Platelets: 231 K/uL (ref 150–400)
RBC: 3.05 MIL/uL — ABNORMAL LOW (ref 4.22–5.81)
RDW: 16.6 % — ABNORMAL HIGH (ref 11.5–15.5)
WBC: 4.8 K/uL (ref 4.0–10.5)
nRBC: 0 % (ref 0.0–0.2)

## 2024-04-02 LAB — PROTIME-INR
INR: 1.8 — ABNORMAL HIGH (ref 0.8–1.2)
Prothrombin Time: 21.7 s — ABNORMAL HIGH (ref 11.4–15.2)

## 2024-04-02 LAB — HEPARIN LEVEL (UNFRACTIONATED): Heparin Unfractionated: <0.1 [IU]/mL — ABNORMAL LOW (ref 0.30–0.70)

## 2024-04-02 MED ORDER — WARFARIN SODIUM 2 MG PO TABS
2.0000 mg | ORAL_TABLET | Freq: Once | ORAL | Status: AC
  Administered 2024-04-02: 2 mg via ORAL
  Filled 2024-04-02: qty 1

## 2024-04-02 MED ORDER — PANTOPRAZOLE SODIUM 40 MG PO TBEC: DELAYED_RELEASE_TABLET | ORAL | 1 refills | Status: AC

## 2024-04-02 NOTE — Progress Notes (Signed)
 ANTICOAGULATION CONSULT NOTE  Pharmacy Consult for warfarin Indication: HM 3  Allergies  Allergen Reactions   Neosporin [Neomycin-Bacitracin Zn-Polymyx] Hives   Doxycycline  Nausea And Vomiting   Mushroom Extract Complex (Obsolete) Nausea And Vomiting   Tape Other (See Comments)    Some tapes/dressings can irritate the skin    Patient Measurements: Height: 5' 9 (175.3 cm) Weight: 79.2 kg (174 lb 9.7 oz) IBW/kg (Calculated) : 70.7  Vital Signs: Temp: 98.6 F (37 C) (07/13 0759) Temp Source: Oral (07/13 0759) BP: 100/75 (07/13 0759) Pulse Rate: 60 (07/13 0759)  Labs: Recent Labs    03/31/24 0331 03/31/24 2047 04/01/24 0217 04/02/24 0228  HGB 7.9* 8.6* 8.6* 8.8*  HCT 25.2* 26.2* 27.0* 27.5*  PLT 248 230 239 231  LABPROT 18.1*  --  19.1* 21.7*  INR 1.4*  --  1.5* 1.8*  HEPARINUNFRC <0.10*  --  <0.10* <0.10*  CREATININE 1.83*  --  1.95* 1.90*    Estimated Creatinine Clearance: 35.1 mL/min (A) (by C-G formula based on SCr of 1.9 mg/dL (H)).  Medical History: Past Medical History:  Diagnosis Date   Arrhythmia    atrial fibrillation   CHF (congestive heart failure) (HCC)    Chronic kidney disease    Coronary artery disease    Hyperlipidemia    Hypertension    Myocardial infarct [facility])     Medications:  See MAR  Assessment: 73 yo male s/p HM3 LVAD 09/2021 with recent CVA 02/10/24 where INR goal was increased to 2.5-3.  Recently bridged with lovenox  40 mg BID (LVAD protocol) from 6/20-6/24 now presenting with GIB.  Pharmacy consulted for warfarin dosing.  PTA warfarin regimen - 2 mg TuThSa, 4 mg all other days (22 mg/wk) per 6/26 office visit (2 mg tablets) No notable DDI's Appeared to be therapeutic (INR ranging 2-2.2) on warfarin regimen of 4 mg Mon/Wed/Fri and 2 mg all other days.   Heparin  level undetectable, INR of 1.8 today reamins subtherapeutic. Hgb 8.8, pltc 231 stable. LDH 128 stable. 100% documented PO intake, no new drug interactions. Will give a  slightly lower dose today to ensure his INR doesn't rise above goal rage. No need for enoxaparin  bridge on discharge with INR of 1.8.   Goal of Therapy:  INR 2-2.5 Heparin  level <0.1 Monitor platelets by anticoagulation protocol: Yes   Plan:  Warfarin 2mg  today then restart home regimen of 2 mg on Tu/Th/Sa and 4mg  all other days  Stop heparin  infusion at discharge Follow up in clinic next week for INR monitoring Daily INR, CBC, heparin  level Monitor for s/sx of bleeding  Thank you for allowing pharmacy to participate in this patient's care,  Nidia Schaffer, PharmD PGY2 Cardiology Pharmacy Resident  Please check AMION for all [facility] Pharmacy phone numbers After 10:00 PM, call Main Pharmacy [phone] 04/02/2024  9:59 AM

## 2024-04-02 NOTE — Progress Notes (Signed)
 Patient ID: Isaac [name], male   DOB: Jan 28, 1951, 73 y.o.   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    S/p 2u PRBcs 7/1, 1 unit 7/2, 7/3, 7/5, 7/6 and 7/7.  Hgb 8.6 today   EGD 7/9 showed 1 non bleeding superficial gastric ulcer, 1 clip placed. Colonoscopy unremarkable   Remains on heparin /warfarin. Denies melena/BRBPR. No ab pain   Hgb 8.6 -> 8.8  INR 1.8   LVAD Interrogation HM III: Speed: 5300 Flow: 4.6 PI: 3.4 Power: 4.0. VAD interrogated personally. Parameters stable.  Objective:   Weight Range: 79.2 kg Body mass index is 25.78 kg/m.   Vital Signs:   Temp:  [97.7 F (36.5 C)-98.6 F (37 C)] 98.6 F (37 C) (07/13 0759) Pulse Rate:  [60-62] 60 (07/13 0759) Resp:  [15-20] 16 (07/13 0759) BP: (85-100)/(68-80) 100/75 (07/13 0759) SpO2:  [97 %-99 %] 97 % (07/13 0258) Last BM Date : 04/01/24  Weight change: Filed Weights   03/31/24 0500 04/01/24 0705  Weight: 79.2 kg 79.2 kg   Intake/Output:   Intake/Output Summary (Last 24 hours) at 04/02/2024 0813 Last data filed at 04/02/2024 0400 Gross per 24 hour  Intake 100.01 ml  Output --  Net 100.01 ml   MAPS 80s    Physical Exam   General:  NAD.  HEENT: normal  Neck: supple. JVP not elevated.  Carotids 2+ bilat; no bruits. No lymphadenopathy or thryomegaly appreciated. Cor: LVAD hum.  Lungs: Clear. Abdomen: obese soft, nontender, non-distended. No hepatosplenomegaly. No bruits or masses. Good bowel sounds. Driveline site clean. Anchor in place.  Extremities: no cyanosis, clubbing, rash. Warm no edema  Neuro: alert & oriented x 3. No focal deficits. Moves all 4 without problem    Telemetry   AV paced 60s Personally reviewed  Labs  CBC Recent Labs    04/01/24 0217 04/02/24 0228  WBC 4.9 4.8  HGB 8.6* 8.8*  HCT 27.0* 27.5*  MCV  89.7 90.2  PLT 239 231   Basic Metabolic Panel Recent Labs    92/87/74 0217 04/02/24 0228  NA 139 137  K 4.7 4.2  CL 111 110  CO2 23 22  GLUCOSE 98 88  BUN 22 20  CREATININE 1.95* 1.90*  CALCIUM  8.2* 8.4*   Liver Function Tests No results for input(s): AST, ALT, ALKPHOS, BILITOT, PROT, ALBUMIN  in the last 72 hours.  No results for input(s): LIPASE, AMYLASE in the last 72 hours. Cardiac Enzymes No results for input(s): CKTOTAL, CKMB, CKMBINDEX, TROPONINI in the last 72 hours.  BNP: BNP (last 3 results) Recent Labs    02/05/24 1106  BNP 343.8*   ProBNP (last 3 results) No results for input(s): PROBNP in the last 8760 hours.  D-Dimer No results for input(s): DDIMER in the last 72 hours. Hemoglobin A1C No results for input(s): HGBA1C in the last 72 hours. Fasting Lipid Panel No results for input(s): CHOL, HDL, LDLCALC, TRIG, CHOLHDL, LDLDIRECT in the last 72 hours. Thyroid  Function Tests No results for input(s): TSH, T4TOTAL, T3FREE, THYROIDAB in the last 72 hours.  Invalid input(s): FREET3  Other results:  Imaging  No results found.  Medications:   Scheduled Medications:  sodium chloride    Intravenous Once   sodium chloride    Intravenous Once   diclofenac  Sodium  2 g Topical QID  multivitamin with minerals  1 tablet Oral Daily   octreotide   100 mcg Subcutaneous Q12H   pantoprazole   40 mg Oral BID   polyethylene glycol  17 g Oral BID   rosuvastatin   10 mg Oral QHS   senna-docusate  1 tablet Oral QHS   sildenafil   20 mg Oral TID   sorbitol   30 mL Oral Once   Warfarin - Pharmacist Dosing Inpatient   Does not apply q1600    Infusions:  heparin  500 Units/hr (03/31/24 1813)    PRN Medications: acetaminophen , ondansetron  (ZOFRAN ) IV Patient Profile   73 y.o. with history of CAD, HTN, atrial fibrillation, and chronic systolic CHF s/p LVAD HM3. Presenting with GIB.   Assessment/Plan   1. GIB:  Patient has epigastric pain, nausea, and melena x 1 week after getting Lovenox  for several days.  INR goal had been increased to 2.5-3 since his CVA. Reports lightheadedness. Hgb 6.4 on admission. Has had 6 units PRBCs so far (will be 7 after today).  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 Repeat EGD/Colonoscopy done today for continued drops in H/H. EGD 7/9 showed 1 non bleeding superficial gastric ulcer, 1 clip placed. Colonoscopy unremarkable.  - hgb improved 8.2 -> 8.8 no evidence of ongoing bleeding - Stop heparin  - Continue protonix  40 mg po bid - INR goal lowered to 2-2.5. INR 1.8 today. Should be stable for d/c - Continue octreotide  100 mcg [place] bid.  - Recommended monthly octreotide  as outpatient, patient and wife want to think about this.    2. Chronic systolic CHF:  Long-standing cardiomyopathy.  [employer] dual chamber ICD.  Echo in 12/22 with EF < 20%, severe LV dilation, restrictive diastolic function, moderate RV dysfunction, moderate MR, mod-severe TR. Cause of cardiomyopathy is uncertain.  He has a history of CAD, but I do not think that the described CAD from the past could explain his cardiomyopathy, but CAD could have progressed.  He was admitted in 12/22 with cardiogenic shock and Impella 5.5. s/p HM III VAD on 09/17/22.  Post-op course complicated by AKI and RV failure. He was on HD for several months but is now off.  Echo in 7/24 showed EF < 20%, IV septum midline to slightly leftward, RV moderately dilated with moderately decreased systolic function, IVC dilated with severe TR.  Echo in 5/25 with EF 20-25%, mild LVH, LVAD present, mild RV dysfunction, moderate AI, mild RV dysfunction with normal RV size, severe TR.  Stable NYHA class II, MAP 70s and LVAD parameters stable today. Not markedly volume overloaded.  - volume ok. Can resume home lasix  - Continue sildenafil  20 mg TID - On Warfarin + heparin  gtt. INR goal 2-2.5. 1.8  today Stop heparin   3. Tricuspid regurgitation:  Tricuspid repair not done at time of VAD due to proximity of ICD wires and hypotension during surgery. He has severe TR.   4. Atrial fibrillation: Paroxysmal.  He is off amiodarone  with resolution of nausea. Currently a-paced.  - Remains in NSR  5. CKD stage IV: Suspect intra-op hypotension at the time of LVAD placement led to development of ATN => urine sediment looked like ATN per renal. Had initial CVVH and transitioned over to HD. He is now off HD.  Creatinine stable 1.90   6. CAD: History of PCI to OM1 in 2007 and RCA in 2013.  No CP or ACs.  - Deferred cardiac cath in 12/22 due to AKI and  plan for VAD - Continue Crestor .    7. Abnormal thyroid  function tests: Patient has had elevated TSH and free T4, mixed pattern. We have stopped amiodarone . 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  TSH normal this admission.  - He follows with endocrinology.    8. 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  INR goal increased to 2.5-3 post-CVA, now goal decreased back to 2-2.5.   9. Right knee pain:  - Uric acid 7.5.  - appears to be pre-patellar bursitis -> improved   Toribio Fuel AGMD 04/02/2024 8:13 AM  VAD Team Pager [phone] (7am - 7am)

## 2024-04-03 ENCOUNTER — Encounter (HOSPITAL_COMMUNITY): Admitting: Cardiology

## 2024-04-03 ENCOUNTER — Other Ambulatory Visit (HOSPITAL_COMMUNITY): Payer: Self-pay

## 2024-04-03 DIAGNOSIS — Z95811 Presence of heart assist device: Secondary | ICD-10-CM

## 2024-04-03 DIAGNOSIS — Z7901 Long term (current) use of anticoagulants: Secondary | ICD-10-CM

## 2024-04-04 ENCOUNTER — Other Ambulatory Visit (HOSPITAL_COMMUNITY): Payer: Self-pay | Admitting: *Deleted

## 2024-04-04 ENCOUNTER — Ambulatory Visit: Attending: Physician Assistant | Admitting: Speech Pathology

## 2024-04-04 ENCOUNTER — Ambulatory Visit: Admitting: Physical Therapy

## 2024-04-04 ENCOUNTER — Encounter: Payer: Self-pay | Admitting: Physical Therapy

## 2024-04-04 ENCOUNTER — Other Ambulatory Visit: Payer: Self-pay

## 2024-04-04 ENCOUNTER — Ambulatory Visit (HOSPITAL_COMMUNITY): Payer: Self-pay | Admitting: Pharmacist

## 2024-04-04 ENCOUNTER — Encounter

## 2024-04-04 ENCOUNTER — Ambulatory Visit (HOSPITAL_COMMUNITY): Payer: Self-pay | Admitting: Cardiology

## 2024-04-04 ENCOUNTER — Ambulatory Visit (HOSPITAL_COMMUNITY)
Admission: RE | Admit: 2024-04-04 | Discharge: 2024-04-04 | Disposition: A | Discharge status: Home / Self Care | Source: Ambulatory Visit | Attending: Cardiology | Admitting: Cardiology

## 2024-04-04 VITALS — BP 91/73 | HR 69 | Ht 69.0 in | Wt 181.6 lb

## 2024-04-04 DIAGNOSIS — Z95811 Presence of heart assist device: Secondary | ICD-10-CM | POA: Diagnosis not present

## 2024-04-04 DIAGNOSIS — I2510 Atherosclerotic heart disease of native coronary artery without angina pectoris: Secondary | ICD-10-CM | POA: Insufficient documentation

## 2024-04-04 DIAGNOSIS — R946 Abnormal results of thyroid function studies: Secondary | ICD-10-CM | POA: Insufficient documentation

## 2024-04-04 DIAGNOSIS — I5022 Chronic systolic (congestive) heart failure: Secondary | ICD-10-CM | POA: Diagnosis not present

## 2024-04-04 DIAGNOSIS — Z7901 Long term (current) use of anticoagulants: Secondary | ICD-10-CM

## 2024-04-04 DIAGNOSIS — N184 Chronic kidney disease, stage 4 (severe): Secondary | ICD-10-CM | POA: Diagnosis not present

## 2024-04-04 DIAGNOSIS — I480 Paroxysmal atrial fibrillation: Secondary | ICD-10-CM | POA: Insufficient documentation

## 2024-04-04 DIAGNOSIS — M6281 Muscle weakness (generalized): Secondary | ICD-10-CM | POA: Diagnosis present

## 2024-04-04 DIAGNOSIS — Z95810 Presence of automatic (implantable) cardiac defibrillator: Secondary | ICD-10-CM | POA: Diagnosis not present

## 2024-04-04 DIAGNOSIS — I428 Other cardiomyopathies: Secondary | ICD-10-CM | POA: Insufficient documentation

## 2024-04-04 DIAGNOSIS — Z4509 Encounter for adjustment and management of other cardiac device: Secondary | ICD-10-CM | POA: Diagnosis present

## 2024-04-04 DIAGNOSIS — K922 Gastrointestinal hemorrhage, unspecified: Secondary | ICD-10-CM | POA: Diagnosis not present

## 2024-04-04 DIAGNOSIS — Z8673 Personal history of transient ischemic attack (TIA), and cerebral infarction without residual deficits: Secondary | ICD-10-CM | POA: Insufficient documentation

## 2024-04-04 DIAGNOSIS — R41841 Cognitive communication deficit: Secondary | ICD-10-CM | POA: Insufficient documentation

## 2024-04-04 DIAGNOSIS — R2689 Other abnormalities of gait and mobility: Secondary | ICD-10-CM

## 2024-04-04 DIAGNOSIS — R278 Other lack of coordination: Secondary | ICD-10-CM | POA: Diagnosis present

## 2024-04-04 DIAGNOSIS — R269 Unspecified abnormalities of gait and mobility: Secondary | ICD-10-CM | POA: Diagnosis present

## 2024-04-04 DIAGNOSIS — I639 Cerebral infarction, unspecified: Secondary | ICD-10-CM | POA: Diagnosis present

## 2024-04-04 DIAGNOSIS — I361 Nonrheumatic tricuspid (valve) insufficiency: Secondary | ICD-10-CM | POA: Diagnosis not present

## 2024-04-04 LAB — IRON AND TIBC
Iron: 26 ug/dL — ABNORMAL LOW (ref 45–182)
Saturation Ratios: 7 % — ABNORMAL LOW (ref 17.9–39.5)
TIBC: 358 ug/dL (ref 250–450)
UIBC: 332 ug/dL

## 2024-04-04 LAB — CBC
HCT: 30.7 % — ABNORMAL LOW (ref 39.0–52.0)
Hemoglobin: 9.6 g/dL — ABNORMAL LOW (ref 13.0–17.0)
MCH: 28.7 pg (ref 26.0–34.0)
MCHC: 31.3 g/dL (ref 30.0–36.0)
MCV: 91.9 fL (ref 80.0–100.0)
Platelets: 289 K/uL (ref 150–400)
RBC: 3.34 MIL/uL — ABNORMAL LOW (ref 4.22–5.81)
RDW: 16.2 % — ABNORMAL HIGH (ref 11.5–15.5)
WBC: 6 K/uL (ref 4.0–10.5)
nRBC: 0 % (ref 0.0–0.2)

## 2024-04-04 LAB — BASIC METABOLIC PANEL WITH GFR
Anion gap: 8 (ref 5–15)
BUN: 22 mg/dL (ref 8–23)
CO2: 25 mmol/L (ref 22–32)
Calcium: 8.9 mg/dL (ref 8.9–10.3)
Chloride: 108 mmol/L (ref 98–111)
Creatinine, Ser: 2.33 mg/dL — ABNORMAL HIGH (ref 0.61–1.24)
GFR, Estimated: 29 mL/min — ABNORMAL LOW (ref >60)
Glucose, Bld: 93 mg/dL (ref 70–99)
Potassium: 4.2 mmol/L (ref 3.5–5.1)
Sodium: 141 mmol/L (ref 135–145)

## 2024-04-04 LAB — FERRITIN: Ferritin: 45 ng/mL (ref 24–336)

## 2024-04-04 LAB — LACTATE DEHYDROGENASE: LDH: 174 U/L (ref 98–192)

## 2024-04-04 LAB — PROTIME-INR
INR: 1.6 — ABNORMAL HIGH (ref 0.8–1.2)
Prothrombin Time: 20.3 s — ABNORMAL HIGH (ref 11.4–15.2)

## 2024-04-04 NOTE — Progress Notes (Addendum)
 Patient presents for hospital discharge follow up by himself today. His wife drove him, but is waiting in the car.  Denies issues/concerns with VAD equipment or drive line.    Patient ambulated in to clinic independently without difficulty. Denies lightheadedness, dizziness, falls, shortness of breath, and signs of bleeding.   Pt alert and oriented with appropriate answers about hospitalization.   Dr. Rolan spoke at length with patient about the benefits of monthly Octreotide  in helping prevent future GI bleeds. Pt asks about side effects and Dr. Rolan explained he received sq  Octreotide  in hospital without any issues and would monthly Octreotide  would present even less risk of side effects.  Pt called his wife to discuss, she remains hesitant about the use of Octreotide . Pt says he will work on her to convince her. I asked him to call us  if they decide to go forward with medication and we will schedule. Pt verbalized agreement to same.     Vital Signs:  Doppler Pressure: 90 Automatic BP: 91/73 (80) HR: 69 SPO2: 100%   Weight: 181.6 lb w/ eqt Last weight: 178 lb w/eqt   VAD Indication: Destination Therapy due to age and kidney disease   LVAD assessment:  HM III: Speed: 5300 rpms Flow: 4.6 Power: 3.9 w    PI: 2.9 Alarms: none Events:  10 - 20 PI events daily  Fixed speed: 5300 Low speed limit: 5000  Primary controller: back up battery due for replacement in 8 months Secondary controller:  back up battery due for replacement in 33 months    I reviewed the LVAD parameters from today and compared the results to the patient's prior recorded data. LVAD interrogation was NEGATIVE for significant power changes, NEGATIVE for clinical alarms and STABLE for PI events/speed drops. No programming changes were made and pump is functioning within specified parameters. Pt is performing daily controller and system monitor self tests along with completing weekly and monthly maintenance for  LVAD equipment.   LVAD equipment check completed and is in good working order. Back-up equipment present.     Exit Site Care: Wife has been performing weekly dressing changes using weekly dressings. Drive line anchor correctly applied.  Pt given 8 weekly kits.  Device:  [employer] dual  Therapies: on Pacing: VVI 50 Last check: 02/23/23   BP & Labs:  Doppler 90 - Doppler is reflecting modified systolic   Hgb pending - No S/S of bleeding. Specifically denies melena/BRBPR or nosebleeds.   LDH pending with established baseline of 200 - 300. Denies tea-colored urine. No power elevations noted on interrogation.   Patient Instructions:  No medication changes today. Repeat lab in one month at [facility] and call us  the day you have it done. Return to see Dr. Rolan in 2 months - see below. Please call us  if you decide you want the monthly Octreotide . Please call us  if any questions or concerns prior to your next visit.    Geofm Christmas RN,BSN VAD Coordinator  Office: [phone]  24/7 Pager: [phone]       Cardiology: Dr. Rolan PCP: Valora Agent, MD  Chief complaint: CHF  Follow up for Heart Failure/LVAD: 73 y.o. with history of CAD, HTN, atrial fibrillation, and chronic systolic CHF returns for followup of CHF/LVAD.   Patient has had a long-standing cardiomyopathy, EF had been in the 25% range for years.  He had PCI to OM1 in 2007 and RCA in 2013.  From his report, these episodes do not sound like ACS events.  Echo in 12/22 showed EF < 20%, severe LV dilation, restrictive diastolic function, moderate RV dysfunction, moderate MR, mod-severe TR. HF was been complicated by CKD stage 3. He was also noted on device interrogation to have been in atrial fibrillation persistently since 10/22.  He was admitted at [facility] in 12/22.  BP was low, his BP-active meds were stopped and midodrine  was begun.  He was diuresed and discharged home.    He reports ongoing severe dyspnea and was  admitted again in 12/22 after he stood up then passed out at home.  Device interrogation showed run of VT treated x 2 with ATP possibly around the time of syncope.  RHC in 12/22 showed low output HF and he developed cardiogenic shock. Impella 5.5 was placed.  On 09/17/21, HM3 LVAD was placed, ICD leads were plastered to the tricuspid valve with severe TR, the valve was not replaced.  The patient had significant interoperative hypotension.  Post-operatively, he developed progressive renal failure and was started on CVVH then iHD.  He had a bumpy post-op course with renal failure and RV failure, but was ultimately able to discharge home on 10/24/21.   Ramp echo in 3/23, RV mildly dilated with normal systolic function, severe TR, LV EF 20-25%, aortic valve opens every beat.  Speed increased to 5300 rpm. The IV septum was mildly leftwards at 5300 rpm but flow increased and AoV still opened every beat.   Patient was admitted in 3/24 with left groin abscess.  This spontaneously drained and was treated with IV antibiotics.  He was sent home on linezolid  and Augmentin .  He developed profuse diarrhea that he attributed to linezolid .  Linezolid  was stopped and he was continued on Augmentin .   Echo in 7/24 showed EF < 20%, IV septum midline to slightly leftward, RV moderately dilated with moderately decreased systolic function, IVC dilated with severe TR.  Speed not changed.   He was admitted with dysarthria and right-sided weakness in 5/25.  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 Echo this admission showed EF 20-25%, mild LVH, LVAD present, mild RV dysfunction, moderate AI, mild RV dysfunction with normal RV size, severe TR.  INR goal increased to 2.5-3. Dual chamber ICD generator was also changed this admission, needs pacing at times with bradycardia.   Patient was admitted 03/21/24 with GI bleeding/melena, he received a total of 7 units PRBCs.  EGD on 7/2 showed 4 AVMs treated with APC,  repeat EGD and colonoscopy on 7/9 showed gastric ulcer that was clipped.  INR goal was decreased to 2-2.5.   Patient is doing better.  He states that his stool is no longer black, now appears normal.  Stamina is improved.  No dyspnea walking on flat ground.  No lightheadedness.  MAP 80 today.    LVAD interrogation (personally reviewed): See LVAD nurse's note above. No alarms, rare PI events.   Labs (3/24): hgb 10.7, LDH 157, LFTs normal, creatinine 2.75 Labs (5/24): K 3.6, creatinine 2.7, LFTs normal, TSH normal Labs (7/24): hgb 11.7, LDH 172, K 3.5, creatinine 2.33 Labs (10/24): K 3.4, creatinine 2.24 Labs (12/24): K 4, creatinine 2.37 Labs (2/25): TSH normal Labs (3/25): hgb 12.5, K 4.9, creatinine 2.57 Labs (4/25): creatinine 2.8 Labs (5/25): K 4, creatinine 1.97, LDH 163, hgb 12.3 Labs (6/25): K 4.3, creatinine 2.24, LDH 175, hgb 11.9, plts 220 Labs (7/25): K 4.2, creatinine 1.9, hgb 8.8  PMH: 1. HTN 2. Hyperlipidemia 3. CAD: H/o PCI to [facility] 2007  and PCI to RCA in 2013.  Uncertain of circumstances but do not sound like ACS events.  4. CKD stage IV: Patient was on HD for several months post-LVAD but was able to come off.  5. Atrial fibrillation: Paroxysmal 6. Chronic systolic CHF: Long-standing cardiomyopathy with EF in the 25% range.  Cause uncertain, has history of PCI to RCA and OM1, but degree of CAD does not seem to explain severity of cardiomyopathy. Father had history of CAD/CHF.  Not heavy drinker. Has [employer] dual chamber ICD.  - Echo (12/22): EF < 20%, severe LV dilation, restrictive diastolic function, moderate RV dysfunction, moderate MR, mod-severe TR.  - 12/22 cardiogenic shock with Impella 5.5 placement then Heartmate 3 LVAD.  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  - Echo (7/24): EF < 20%, IV septum midline to slightly leftward, RV moderately dilated with moderately decreased systolic function, IVC dilated with severe  TR. - Echo (5/25): EF 20-25%, mild LVH, LVAD present, mild RV dysfunction, moderate AI, mild RV dysfunction with normal RV size, severe TR.  7. VT - Nausea with amiodarone .  8. RV failure/severe TR 9. Citrobacter UTI (8/23) 10. Left groin abscess 3/24 11. 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  12. GI bleeding: 7/25, multiple AVMs treated with APC.    Current Outpatient Medications  Medication Sig Dispense Refill   furosemide  (LASIX ) 20 MG tablet Take 2 tablets (40 mg total) by mouth daily. 60 tablet 0   magnesium  oxide (MAG-OX) 400 (240 Mg) MG tablet Take 1 tablet (400 mg total) by mouth daily. 30 tablet 0   Multiple Vitamins-Minerals (ONE A DAY MEN 50 PLUS PO) Take 1 tablet by mouth daily.     pantoprazole  (PROTONIX ) 40 MG tablet Take twice a day for 1 month and then can decrease back to once daily. 60 tablet 1   potassium chloride  SA (KLOR-CON  M) 10 MEQ tablet Take 2 tablets (20 mEq total) by mouth daily. 180 tablet 3   rosuvastatin  (CRESTOR ) 10 MG tablet Take 1 tablet (10 mg total) by mouth at bedtime. 30 tablet 0   sildenafil  (REVATIO ) 20 MG tablet Take 1 tablet (20 mg total) by mouth 3 (three) times daily. 90 tablet 0   warfarin (COUMADIN ) 2 MG tablet Take 2 mg by mouth every Tuesday, Thursday, and Saturday and 4 mg all other days 40 tablet 11   acetaminophen  (TYLENOL ) 325 MG tablet Take 2 tablets (650 mg total) by mouth every 4 (four) hours as needed for headache or mild pain (pain score 1-3).     No current facility-administered medications for this encounter.    Neosporin [neomycin-bacitracin zn-polymyx], Doxycycline , Mushroom extract complex (obsolete), and Tape  REVIEW OF SYSTEMS: All systems negative except as listed in HPI, PMH and Problem list.  I reviewed the LVAD parameters from today, and compared the results to the patient's prior recorded data.  No programming changes were made.  The LVAD is functioning within specified parameters.  The patient  performs LVAD self-test daily.  LVAD interrogation was negative for any significant power changes, alarms or PI events/speed drops.  LVAD equipment check completed and is in good working order.  Back-up equipment present.   LVAD education done on emergency procedures and precautions and reviewed exit site care.    Vitals:   04/04/24 1212 04/04/24 1218  BP: (!) 90/0 91/73  Pulse:  69  SpO2:  100%  Weight:  82.4 kg (181 lb 9.6  oz)  Height:  5' 9 (1.753 m)  MAP 80  Physical Exam: General: Well appearing this am. NAD.  HEENT: Normal. Neck: Supple, JVP 8 cm. Carotids OK.  Cardiac:  Mechanical heart sounds with LVAD hum present.  Lungs:  CTAB, normal effort.  Abdomen:  NT, ND, no HSM. No bruits or masses. +BS  LVAD exit site: Well-healed and incorporated. Dressing dry and intact. No erythema or drainage. Stabilization device present and accurately applied. Driveline dressing changed daily per sterile technique. Extremities:  Warm and dry. No cyanosis, clubbing, rash, trace ankle edema.  Neuro:  Alert & oriented x 3. Cranial nerves grossly intact. Moves all 4 extremities w/o difficulty. Affect pleasant    ASSESSMENT AND PLAN: 1. 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  Melena has resolved.  - CBC today.  - Continue Protonix  40 mg bid x 1 month then 40 mg daily.  - I recommended that he start monthly octreotide  infusions to decrease risk for recurrent AVM bleeding.  His wife has been reluctant for him to start this. They are gong to discuss this further.  - Check Fe studies next appointment.  2. Chronic systolic CHF:  Long-standing cardiomyopathy.  [employer] dual chamber ICD.  Echo in 12/22 with EF < 20%, severe LV dilation, restrictive diastolic function, moderate RV dysfunction, moderate MR, mod-severe TR. Cause of cardiomyopathy is uncertain.  He has a history of CAD, but I do not think that the described CAD from the past could explain his cardiomyopathy, but  CAD could have progressed.  He was admitted in 12/22 with cardiogenic shock and Impella 5.5. s/p HM III VAD on 09/17/22.  Post-op course complicated by AKI and RV failure. He was on HD for several months but is now off.  Echo in 7/24 showed EF < 20%, IV septum midline to slightly leftward, RV moderately dilated with moderately decreased systolic function, IVC dilated with severe TR.  Echo in 5/25 with EF 20-25%, mild LVH, LVAD present, mild RV dysfunction, moderate AI, mild RV dysfunction with normal RV size, severe TR.  NYHA class II, not significantly volume overloaded on exam. LVAD parameters reviewed today and stable, no alarms.  - Continue Lasix  40 mg daily.  - Continue sildenafil  20 mg tid.  - Continue warfarin, INR goal decreased to 2-2.5 with GI bleeding.  3. Tricuspid regurgitation:  Tricuspid repair not done at time of VAD due to proximity of ICD wires and hypotension during surgery. He has severe TR.  4. Atrial fibrillation: Paroxysmal.  He is off amiodarone  with resolution of nausea.  I will not restart it.  He has been a-paced recently.  5. CKD stage IV: Suspect intra-op hypotension at the time of LVAD placement led to development of ATN => urine sediment looked like ATN per renal. Had initial CVVH and transitioned over to HD. He is now off HD.  - BMET today.  6. CAD: History of PCI to OM1 in 2007 and RCA in 2013.  No CP or ACs.  - Deferred cardiac cath in 12/22 due to AKI and plan for VAD - Continue Crestor .   7. Abnormal thyroid  function tests: Patient has had elevated TSH and free T4, mixed pattern. We have stopped amiodarone . 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  Most recent TSH was normal.  - He follows with endocrinology.   8. Nausea: This seems to have been due to amiodarone , resolved off amiodarone .   9. 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  INR goal increased to 2.5-3 but then  decreased back to 2-2.5 with GI bleeding.   CBC in 1 month then followup in 2 months.  I spent 42 minutes reviewing records, interviewing/examining patient, and managing orders.    Ezra Shuck 04/04/2024

## 2024-04-04 NOTE — Therapy (Unsigned)
 OUTPATIENT SPEECH LANGUAGE PATHOLOGY  COGNITIVE COMMUNICATION TREATMENT NOTE   Patient Name: Isaac [name] MRN: [id number] DOB:1951/04/21, 73 y.o., male Today's Date: 04/04/2024  PCP: Lynwood Null, MD REFERRING PROVIDER: Toribio Pitch, PA   End of Session - 04/04/24 1405     Visit Number 8    Number of Visits 25    Date for SLP Re-Evaluation 05/17/24    Authorization Type [employer] HMO/PPO    Progress Note Due on Visit 10    SLP Start Time 1400    SLP Stop Time  1445    SLP Time Calculation (min) 45 min    Activity Tolerance Patient tolerated treatment well          Past Medical History:  Diagnosis Date   Arrhythmia    atrial fibrillation   CHF (congestive heart failure) (HCC)    Chronic kidney disease    Coronary artery disease    Hyperlipidemia    Hypertension    Myocardial infarct [facility])    Past Surgical History:  Procedure Laterality Date   CARDIAC DEFIBRILLATOR PLACEMENT  feb 2014   COLONOSCOPY N/A 03/29/2024   Procedure: COLONOSCOPY;  Surgeon: Legrand Victory LITTIE DOUGLAS, MD;  Location: [facility] ENDOSCOPY;  Service: Gastroenterology;  Laterality: N/A;   ENTEROSCOPY N/A 03/22/2024   Procedure: ENTEROSCOPY;  Surgeon: Leigh Elspeth SQUIBB, MD;  Location: [facility] ENDOSCOPY;  Service: Gastroenterology;  Laterality: N/A;   ESOPHAGOGASTRODUODENOSCOPY N/A 03/29/2024   Procedure: EGD (ESOPHAGOGASTRODUODENOSCOPY);  Surgeon: Legrand Victory LITTIE DOUGLAS, MD;  Location: [facility] ENDOSCOPY;  Service: Gastroenterology;  Laterality: N/A;   ICD GENERATOR CHANGEOUT N/A 02/09/2024   Procedure: ICD GENERATOR CHANGEOUT;  Surgeon: Waddell Danelle ORN, MD;  Location: [facility] INVASIVE CV LAB;  Service: Cardiovascular;  Laterality: N/A;   INSERTION OF IMPLANTABLE LEFT VENTRICULAR ASSIST DEVICE N/A 09/17/2021   Procedure: INSERTION OF IMPLANTABLE LEFT VENTRICULAR ASSIST DEVICE AND INSERTION OF FEMORAL ARTERIAL LINE;  Surgeon: Lucas Dorise POUR, MD;  Location: [facility] OR;  Service: Open Heart Surgery;  Laterality: N/A;   IR FLUORO  GUIDE CV LINE RIGHT  09/27/2021   IR THORACENTESIS ASP PLEURAL SPACE W/IMG GUIDE  09/27/2021   IR US  GUIDE VASC ACCESS RIGHT  09/27/2021   PLACEMENT OF IMPELLA LEFT VENTRICULAR ASSIST DEVICE N/A 09/12/2021   Procedure: PLACEMENT OF IMPELLA 5.5 LEFT VENTRICULAR ASSIST DEVICE;  Surgeon: Kerrin Elspeth BROCKS, MD;  Location: [facility] OR;  Service: Open Heart Surgery;  Laterality: N/A;   RIGHT HEART CATH N/A 09/08/2021   Procedure: RIGHT HEART CATH;  Surgeon: Rolan Ezra RAMAN, MD;  Location: [facility] INVASIVE CV LAB;  Service: Cardiovascular;  Laterality: N/A;   TEE WITHOUT CARDIOVERSION N/A 09/12/2021   Procedure: TRANSESOPHAGEAL ECHOCARDIOGRAM (TEE);  Surgeon: Kerrin Elspeth BROCKS, MD;  Location: [facility] OR;  Service: Open Heart Surgery;  Laterality: N/A;   TEE WITHOUT CARDIOVERSION N/A 09/17/2021   Procedure: TRANSESOPHAGEAL ECHOCARDIOGRAM (TEE);  Surgeon: Lucas Dorise POUR, MD;  Location: [facility] OR;  Service: Open Heart Surgery;  Laterality: N/A;   Patient Active Problem List   Diagnosis Date Noted   Acute gastric ulcer with hemorrhage 03/29/2024   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   Melena 03/22/2024   ABLA (acute blood loss anemia) 03/22/2024   Anticoagulated 03/22/2024   AVM (arteriovenous malformation) of small bowel, acquired with hemorrhage 03/22/2024   Upper GI bleed 03/21/2024   Hematoma 02/17/2024   Anemia 02/15/2024   Hypotension 02/15/2024   Sundowning 02/11/2024   Insomnia 02/11/2024   Constipation 02/11/2024   Stage 3b chronic kidney disease (HCC) 02/11/2024  Cardioembolic stroke (HCC) 02/10/2024   Acute left PCA stroke (HCC) 02/05/2024   Abnormal thyroid  blood test 04/05/2023   Abscess of left groin 11/27/2022   VT (ventricular tachycardia) (HCC) 10/27/2022   ICD (implantable cardioverter-defibrillator) in place 10/27/2022   Pressure injury of skin 10/07/2021   LVAD (left ventricular assist device) present (HCC)    Protein-calorie malnutrition, severe 09/10/2021   Elevated TSH    PICC (peripherally  inserted central catheter) in place    Cardiogenic shock (HCC)    Syncope 09/07/2021   CHF (congestive heart failure) (HCC) 09/06/2021   CAD (coronary artery disease) 08/22/2021   Cardiomyopathy (HCC) 08/22/2021   Mixed hyperlipidemia 08/22/2021   A-fib (HCC) 08/22/2021   Elevated troponin 08/22/2021   Acute on chronic combined systolic and diastolic CHF (congestive heart failure) (HCC) 08/22/2021   Benign essential hypertension 02/22/2015   Chronic systolic heart failure (HCC) 07/05/2014    ONSET DATE: 02/05/2024; date of referral  02/22/2024  REFERRING DIAG: P36.467 (ICD-10-CM) - Cerebral infarction due to unspecified occlusion or stenosis of left posterior cerebral artery  THERAPY DIAG:  Cognitive communication deficit  Rationale for Evaluation and Treatment Rehabilitation  SUBJECTIVE:   PERTINENT HISTORY and DIAGNOSTIC FINDINGS:  Isaac [name] is a 73 y.o. with history of CAD, HTN, atrial fibrillation, CKD IV, VT and chronic systolic CHFs/p HM-3 LVAD on 09/17/21.  He presented to the [facility] ED on 02/05/24 for focal neuro deficits with R vision field cut and dysmetria. Code Stroke activated. CT brain positive for acute to subacute appearing Left PCA and Left cerebellar infarcts. Hyperdense Left PCA suggests occlusion of that vessel. No hemorrhagic transformation or mass effect. Seen by Neuro and decision made to treat with IV heparin . Echocardiogram shows EF 25 to 30% with LVAD at the apex.  Left atrial is moderately dilate. CT angio distal left P2/P3 occlusion.Repeat CT head 02/06/24 with evolving left cerebellar, left PCA and right frontal gyrus MCA branch infarct. No hemorrhage.  Neurology feels infarcts 2/2 LVAD and signed off  Pt attended [facility] CIR from until 02/18/2024, received ST services targeting cognitive linguistic abilities and discharged at [facility] for cognitive linguistic skills. Memory/orientation deficits remain the pt's biggest barrier to progress and  overall safety.   PAIN:  Are you having pain? No   FALLS: Has patient fallen in last 6 months?  No  LIVING ENVIRONMENT: Lives with: lives with their spouse Lives in: House/apartment  PLOF:  Level of assistance: Independent with ADLs, Independent with IADLs Employment: Retired   PATIENT GOALS   to improve functional independence  SUBJECTIVE STATEMENT: Pt's wife stayed in lobby with their grandson, pt eager to participate, continued good effort  Pt accompanied by: significant other  OBJECTIVE:  TODAY'S TREATMENT:   Skilled treatment session focused on pt's cognitive communication goals. SLP facilitated session by providing the following interventions:     PATIENT EDUCATION: Education details: see above Person educated: Patient and Spouse Education method: Explanation Education comprehension: verbalized understanding   HOME EXERCISE PROGRAM:   Set alarms on phone [employer] [facility], see above   GOALS:  Goals reviewed with patient? Yes  SHORT TERM GOALS: Target date: 10 sessions  With Mod A, pt will utilize external memory aids to answer questions regarding orientation information with 80% accuracy.  Baseline: Goal status: INITIAL  2.  With Mod A, pt will improve word finding by using strategies to produce generative naming list of equipment and vocabulary related to LVAD.  Baseline:  Goal status: INITIAL  3.  Pt will complete addition cognitive assessment to establish current level of ability.  Baseline: Pt discharged from CIR requiring Mod A for basic cognitive function.  Goal status: INITIAL   LONG TERM GOALS: Target date: 05/17/2024  With supervision A, pt will attend to items on his right to improve safety awareness and accuracy of task completion in 5 out of 7 opportunities.  Baseline:  Goal status: INITIAL  2.  With Min A, pt will external and internal memory aides to help short term and prospective memory in 5 out of 7 opportunities.  Baseline:   Goal status: INITIAL  3.  With supervision A, pt will be able to verbally sequence (using appropriate vocabulary) LVAD features, alerts and switching battery sources.  Baseline:  Goal status: INITIAL   ASSESSMENT:  CLINICAL IMPRESSION: Patient is a 73 y.o. male LVAD patient, who was seen today for a cognitive communication treatment d/t left cerebellar, left PCA and right frontal gyrus MCA branch infarct. Pt presents with severe cognitive impairment that is c/b by severe deficits in attention, awareness (pt is aware of difficulty/poor performance within tasks but unaware of overall impairment and impact on safety), profound memory deficits in immediate recall, short term memory, retrieval, visual, auditory and prospective memory decreased awareness of confabulatory responses and severe deficits in executive function as observed during visuospatial tasks, specifically task planning, organization, sequencing with pt losing points d/t slow task completion. Overall adequate expressive and receptive communication.   Pt continues to be eager and puts forth great effort. See the above treatment note for details.     OBJECTIVE IMPAIRMENTS include attention, memory, awareness, and executive functioning. These impairments are limiting patient from managing medications, managing appointments, managing finances, household responsibilities, and ADLs/IADLs. Factors affecting potential to achieve goals and functional outcome are ability to learn/carryover information, co-morbidities, medical prognosis, previous level of function, and severity of impairments. Patient will benefit from skilled SLP services to address above impairments and improve overall function.  REHAB POTENTIAL: Good  PLAN: SLP FREQUENCY: 1-2x/week  SLP DURATION: 12 weeks  PLANNED INTERVENTIONS: Language facilitation, Environmental controls, Cueing hierachy, Cognitive reorganization, Internal/external aids, Functional tasks, SLP  instruction and feedback, Compensatory strategies, and Patient/family education  Patient Details  Name: Taeden Geller MRN: [id number] Date of Birth: 10/24/1950 Referring Provider:  Pegge Toribio PARAS, PA-C  Encounter Date: 04/04/2024   Marquiz Sotelo B. Rubbie, M.S., CCC-SLP, Tree surgeon Certified Brain Injury Specialist [facility]  [facility] Rehabilitation Services Office [phone] Ascom [phone] Fax [phone]

## 2024-04-04 NOTE — Patient Instructions (Signed)
 No medication changes today. Repeat lab in one month at [facility] and call us  the day you have it done. Return to see Dr. Rolan in 2 months - see below. Please call us  if you decide you want the monthly Octreotide . Please call us  if any questions or concerns prior to your next visit.

## 2024-04-04 NOTE — Therapy (Signed)
 OUTPATIENT PHYSICAL THERAPY NEURO EVALUATION   Patient Name: Isaac [name] MRN: [id number] DOB:[birth year], 73 y.o., male Today's Date: 04/04/2024   PCP: Valora Agent, MD  REFERRING PROVIDER: Urbano Albright, MD    END OF SESSION:   PT End of Session - 04/04/24 1450     Visit Number 1    Number of Visits 24    Date for PT Re-Evaluation 06/27/24    PT Start Time 1450    PT Stop Time 1530    PT Time Calculation (min) 40 min    Equipment Utilized During Treatment Gait belt    Activity Tolerance Patient tolerated treatment well    Behavior During Therapy WFL for tasks assessed/performed           Past Medical History:  Diagnosis Date   Arrhythmia    atrial fibrillation   CHF (congestive heart failure) (HCC)    Chronic kidney disease    Coronary artery disease    Hyperlipidemia    Hypertension    Myocardial infarct [facility])    Past Surgical History:  Procedure Laterality Date   CARDIAC DEFIBRILLATOR PLACEMENT  feb 2014   COLONOSCOPY N/A 03/29/2024   Procedure: COLONOSCOPY;  Surgeon: Legrand Victory LITTIE DOUGLAS, MD;  Location: [facility] ENDOSCOPY;  Service: Gastroenterology;  Laterality: N/A;   ENTEROSCOPY N/A 03/22/2024   Procedure: ENTEROSCOPY;  Surgeon: Leigh Elspeth SQUIBB, MD;  Location: [facility] ENDOSCOPY;  Service: Gastroenterology;  Laterality: N/A;   ESOPHAGOGASTRODUODENOSCOPY N/A 03/29/2024   Procedure: EGD (ESOPHAGOGASTRODUODENOSCOPY);  Surgeon: Legrand Victory LITTIE DOUGLAS, MD;  Location: [facility] ENDOSCOPY;  Service: Gastroenterology;  Laterality: N/A;   ICD GENERATOR CHANGEOUT N/A 02/09/2024   Procedure: ICD GENERATOR CHANGEOUT;  Surgeon: Waddell Danelle ORN, MD;  Location: [facility] INVASIVE CV LAB;  Service: Cardiovascular;  Laterality: N/A;   INSERTION OF IMPLANTABLE LEFT VENTRICULAR ASSIST DEVICE N/A 09/17/2021   Procedure: INSERTION OF IMPLANTABLE LEFT VENTRICULAR ASSIST DEVICE AND INSERTION OF FEMORAL ARTERIAL LINE;  Surgeon: Lucas Dorise POUR, MD;  Location: [facility] OR;  Service: Open Heart Surgery;   Laterality: N/A;   IR FLUORO GUIDE CV LINE RIGHT  09/27/2021   IR THORACENTESIS ASP PLEURAL SPACE W/IMG GUIDE  09/27/2021   IR US  GUIDE VASC ACCESS RIGHT  09/27/2021   PLACEMENT OF IMPELLA LEFT VENTRICULAR ASSIST DEVICE N/A 09/12/2021   Procedure: PLACEMENT OF IMPELLA 5.5 LEFT VENTRICULAR ASSIST DEVICE;  Surgeon: Kerrin Elspeth BROCKS, MD;  Location: [facility] OR;  Service: Open Heart Surgery;  Laterality: N/A;   RIGHT HEART CATH N/A 09/08/2021   Procedure: RIGHT HEART CATH;  Surgeon: Rolan Ezra RAMAN, MD;  Location: [facility] INVASIVE CV LAB;  Service: Cardiovascular;  Laterality: N/A;   TEE WITHOUT CARDIOVERSION N/A 09/12/2021   Procedure: TRANSESOPHAGEAL ECHOCARDIOGRAM (TEE);  Surgeon: Kerrin Elspeth BROCKS, MD;  Location: [facility] OR;  Service: Open Heart Surgery;  Laterality: N/A;   TEE WITHOUT CARDIOVERSION N/A 09/17/2021   Procedure: TRANSESOPHAGEAL ECHOCARDIOGRAM (TEE);  Surgeon: Lucas Dorise POUR, MD;  Location: [facility] OR;  Service: Open Heart Surgery;  Laterality: N/A;   Patient Active Problem List   Diagnosis Date Noted   Acute gastric ulcer with hemorrhage 03/29/2024   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   Melena 03/22/2024   ABLA (acute blood loss anemia) 03/22/2024   Anticoagulated 03/22/2024   AVM (arteriovenous malformation) of small bowel, acquired with hemorrhage 03/22/2024   Upper GI bleed 03/21/2024   Hematoma 02/17/2024   Anemia 02/15/2024   Hypotension 02/15/2024   Sundowning 02/11/2024   Insomnia 02/11/2024   Constipation 02/11/2024   Stage  3b chronic kidney disease (HCC) 02/11/2024   Cardioembolic stroke (HCC) 02/10/2024   Acute left PCA stroke (HCC) 02/05/2024   Abnormal thyroid  blood test 04/05/2023   Abscess of left groin 11/27/2022   VT (ventricular tachycardia) (HCC) 10/27/2022   ICD (implantable cardioverter-defibrillator) in place 10/27/2022   Pressure injury of skin 10/07/2021   LVAD (left ventricular assist device) present (HCC)    Protein-calorie malnutrition, severe 09/10/2021   Elevated  TSH    PICC (peripherally inserted central catheter) in place    Cardiogenic shock (HCC)    Syncope 09/07/2021   CHF (congestive heart failure) (HCC) 09/06/2021   CAD (coronary artery disease) 08/22/2021   Cardiomyopathy (HCC) 08/22/2021   Mixed hyperlipidemia 08/22/2021   A-fib (HCC) 08/22/2021   Elevated troponin 08/22/2021   Acute on chronic combined systolic and diastolic CHF (congestive heart failure) (HCC) 08/22/2021   Benign essential hypertension 02/22/2015   Chronic systolic heart failure (HCC) 07/05/2014    ONSET DATE: 02/05/24  REFERRING DIAG:  P36.467 (ICD-10-CM) - Cerebral infarction due to unspecified occlusion or stenosis of left posterior cerebral artery   THERAPY DIAG:  Muscle weakness (generalized)  Cardioembolic stroke (HCC)  LVAD (left ventricular assist device) present (HCC)  Imbalance  Rationale for Evaluation and Treatment: Rehabilitation  SUBJECTIVE:                                                                                                                                                                                             SUBJECTIVE STATEMENT: Patient returning for further physical therapy following recent hospitalization due to melena following Lovenox  injections to improve his INR. Pt reports he had a GI bleed. Patient had previously been receiving PT following left cerebellar, PCA, and R frontal MCA branch CVA 2/2 to LVAD device. Patient reports he takes his time when walking and ensures to scan the environment for potential items that would cause imbalance. Denies stumbles/falls since hospitalization. Pt states he cannot see all the way to the right due to a visual field cut since CVA. Pt unable to identify specific mobility tasks that are challenging to him without more guided questioning. Pt reports R LE is a little weak with reports of feeling his foot drag sometimes when walking.   Pt accompanied by: self   PERTINENT HISTORY:    From 04/02/2024 D/C note from hospital: He was admitted on 03/21/2024 from the office for GI bleed with hemoglobin down to 6.4. Coumadin  was held. He was placed on PPI. GI was consulted and he was transfused multiple PRBCs (total of 7 units this admission). He underwent EGD on 7/2 with friable tissue in  the duodenum and 4 AVMS which were treated with APC. INR goal was adjusted to 2-2.5. Unfortunately, he continued to have melena and his hemoglobin continued to drop. He underwent repeat EGD + colonoscopy on 7/9  which showed 1 non-bleeding superficial gastric ulcer and 1 clip was placed. Colonoscopy unremarkable. No further bleeding or drop in hemoglobin. INR 1.8 on day of discharge.   Presented 02/05/2024 when his pacemaker went off abruptly resulting in a fall as well as noted right sided weakness and aphasia with blurred vision. Cranial CT scan positive for acute to subacute appearing left PCA left cerebellar infarction. Hyperdense left PCA suggesting occlusion of that vessel. He was also diagnosed with right frontal gyrus MCA infarction. No hemorrhagic transformation or mass effect. Underlying advanced chronic small vessel disease. Right supraorbital forehead recent soft tissue injury with no skull fracture identified. CTA head and neck demonstrating subtotal occlusion of the left PCA in the P2/P3 junction and additionally moderate to severe bilateral ICA siphon atherosclerosis with a least moderate siphon stenosis bilaterally.  Admitted to  rehab 02/10/2024 for inpatient therapies to consist of PT, ST and OT at least three hours five days a week. Past admission physiatrist, therapy team and rehab RN have worked together to provide customized collaborative inpatient rehab.  Pertaining to patient's cardioembolic left cerebellar, PCA and right frontal MCA branch infarction secondary to LVAD device despite being on anticoagulation with warfarin.  Patient remained on Coumadin  therapy goal INR 2.5-3.0.  Atrial  fibrillation/VT chronic systolic congestive heart failure status post LVAD 09/17/2021.  Continued Revatio  followed by heart failure team status post generator change 02/09/2024.   PAIN:  Are you having pain? No  PRECAUTIONS: ICD/Pacemaker and Other: LVAD  RED FLAGS: None and LVAD   WEIGHT BEARING RESTRICTIONS: LVAD and pace maker.   FALLS: Has patient fallen in last 6 months? No  LIVING ENVIRONMENT: Lives with: lives with their spouse Stevie) Lives in: House/apartment Stairs: Yes: Internal: 9 steps; on left going up Has following equipment at home: Single point cane, Walker - 2 wheeled, and does not use  PLOF: Independent with basic ADLs and Independent with household mobility without device, driving  PATIENT GOALS: pt reports the sight in R visual field is limited. Improve strength in R LE primarily. Improve balance slightly.   OBJECTIVE:  Note: Objective measures were completed at Evaluation unless otherwise noted.  DIAGNOSTIC FINDINGS:  CT head: IMPRESSION: 1. Expected evolution of Left cerebellar and Left PCA infarcts since yesterday. No hemorrhage or mass effect. 2. Evolution also of a Right MCA middle frontal gyrus infarct predicted by abnormal CTP T-max yesterday. No hemorrhage or mass effect. 3. Consider a recent Embolic event given the recent anterior and posterior vascular territory infarcts.  CT neck  IMPRESSION: 1. CTP does not detect the known Left PCA infarct core (although T-max oligemia corresponds to that finding), while CTA demonstrates subtotal occlusion of the Left PCA at the P2/P3 junction (some distal flow).   2. CTP also suggests Oligemia in the anterior Right MCA division, although no MCA branch occlusion identified by CTA.   3. The above were discussed by telephone with Dr. Rosemarie on 02/05/2024 at 12:36 .   4. Additionally, Moderate to Severe bilateral ICA siphon atherosclerosis with at least Moderate siphon stenosis bilaterally (Moderate  to Severe at the Right anterior genu).   5. Extracranial atherosclerosis but no significant Vertebral artery or extracranial carotid stenosis.   6. Aortic Atherosclerosis (ICD10-I70.0) and Emphysema (ICD10-J43.9).  DG chest FINDINGS: Single frontal  view of the chest demonstrates stable left ventricular assist device and multi lead pacer/AICD. Cardiac silhouette remains enlarged. No acute airspace disease, effusion, or pneumothorax. No acute bony abnormalities.     COGNITION: Overall cognitive status: delayed response to history questions, see SLP note for more details on cognitive/speech deficits    SENSATION: WFL  COORDINATION:   Slight decreased speed of movement on the RLE>L LE  POSTURE: forward head  LOWER EXTREMITY ROM:     AROM. WFL  LOWER EXTREMITY MMT:    MMT Right Eval Left Eval  Hip flexion 4 5  Hip extension    Hip abduction 4+ 5  Hip adduction 4+ 5  Hip internal rotation    Hip external rotation    Knee flexion 4 5  Knee extension 4+ 5  Ankle dorsiflexion 4+ 4+  Ankle plantarflexion 4+ 4+  Ankle inversion    Ankle eversion    (Blank rows = not tested)  Manual Muscle Test Scale 0/5 = No muscle contraction can be seen or felt 1/5 = Contraction can be felt, but there is no motion 2-/5 = Part moves through incomplete ROM w/ gravity decreased 2/5 = Part moves through complete ROM w/ gravity decreased 2+/5 = Part moves through incomplete ROM (<50%) against gravity or through complete ROM w/ gravity 3-/5 = Part moves through incomplete ROM (>50%) against gravity 3/5 = Part moves through complete ROM against gravity 3+/5 = Part moves through complete ROM against gravity/slight resistance 4-/5= Holds test position against slight to moderate pressure 4/5 = Part moves through complete ROM against gravity/moderate resistance 4+/5= Holds test position against moderate to strong pressure 5/5 = Part moves through complete ROM against gravity/full  resistance  BED MOBILITY:  Not tested  TRANSFERS: Sit to stand: Complete Independence  Assistive device utilized: None     Stand to sit: Complete Independence  Assistive device utilized: None     Chair to chair: Complete Independence  Assistive device utilized: None       RAMP:  Not tested  CURB:  Not tested  STAIRS: Not tested GAIT: Findings: Gait Characteristics: occasional decreased R foot clearance during swing, step through pattern, decreased arm swing- Right, decreased arm swing- Left, and decreased stance time- Right, Distance walked: 60, Assistive device utilized:None, and Level of assistance: Modified independence and SBA  FUNCTIONAL TESTS:  5 times sit to stand: 15.92 seconds 6 minute walk test: 1284 feet (391 meters, Avg speed 1.29m/s), no AD 10 meter walk test: need to assess FGA: need to assess Mini-Best Test: need to assess  PATIENT SURVEYS:  Stroke Impact Scale 72%                                                                                                                               TREATMENT DATE: 04/04/2024   Unless otherwise stated, SBA was provided and gait belt donned in order to ensure pt safety throughout session.  6 Min Walk  Test:  Instructed patient to ambulate as quickly and as safely as possible for 6 minutes using LRAD. Patient was allowed to take standing rest breaks without stopping the test, but if the patient required a sitting rest break the clock would be stopped and the test would be over.  Results: 1284 feet (391 meters, Avg speed 1.71m/s), no AD, with supervision progressing to independent. Results indicate that the patient has reduced endurance with ambulation compared to age matched norms.  *Pt able to clear R foot consistently with only a few times of scuffing it on the ground, but does report it feels heavier and noticed requiring slight increased effort to lift R LE and advance during swing phase of gait when pt becomes  fatigued. Age Matched Norms: 5-69 yo M: 8 F: 20, 38-79 yo M: 54 F: 471, 22-89 yo M: 417 F: 392 MDC: 58.21 meters (190.98 feet) or 50 meters (ANPTA Core Set of Outcome Measures for Adults with Neurologic Conditions, 2018)   Five times Sit to Stand Test (FTSS) "Stand up and sit down as quickly as possible 5 times, keeping your arms folded across your chest."    TIME: 15.92 seconds from green chair with arms across chest  Times > 13.6 seconds is associated with increased disability and morbidity (Guralnik, 2000) Times > 15 seconds is predictive of recurrent falls in healthy individuals aged 17 and older (Buatois, et al., 2008) Normal performance values in community dwelling individuals aged 34 and older (Bohannon, 2006): 60-69 years: 11.4 seconds 70-79 years: 12.6 seconds 80-89 years: 14.8 seconds  MCID: >= 2.3 seconds for Vestibular Disorders (Meretta, 2006)  Stroke Impact Scale 16 (Copyrighted instrument, [facility])  In the past 2 weeks, how difficult was it to...  Rating Scale 5 = Not difficult at all 4 = A little difficult 3 = Somewhat difficult 2 = Very difficult 1 = Could not do at all  a. Dress the top part of your body? 5  b. Bathe yourself? 5  c. Get to the toilet on time? 5  d. Control your bladder (not have an accident)? 5  e. Control your bowels (not have an accident)? 5  f. Stand without losing balance? 5  g. Go shopping? 3  h. Do heavy household chores (e.g. vacuum, laundry or  yard work)? 3  i. Stay sitting without losing your balance? 5  j. Walk without losing your balance? 4  k. Move from a bed to a chair? 5  l. Walk fast? 4  m. Climb one flight of stairs? 4  n. Walk one block? 4  o. Get in and out of a car? 5  p. Carry heavy objects (e.g. bag of groceries) with your  affected hand? 5  Sum:  72/80  MDC (Minimal Detectable Change) is >/=8    Patient reports feeling confident resuming previously prescribed HEP. Will  benefit from review of this in upcoming sessions.     PATIENT EDUCATION: Education details: therapy POC, findings from assessment, continuation of prior HEP Person educated: Patient Education method: Explanation Education comprehension: verbalized understanding and needs further education  HOME EXERCISE PROGRAM:  Access Code: [email or web address] URL: [email or web address] Date: 03/02/2024 Prepared by: Connell Kiss   Exercises - Tandem Stance with Support  - 1 x daily - 4 x weekly - 3 sets - 4 reps - 20 seconds  hold - Forward and Backward Walking with Eyes Closed and Counter Support  - 1 x daily - 4 x weekly - 3  sets - 5 reps - Side Stepping with Resistance at Thighs and Counter Support  - 1 x daily - 7 x weekly - 2 sets - 10 reps - Forward Step Up with Unilateral Counter Support  - 1 x daily - 7 x weekly - 1-2 sets - 10 reps - Walking  - 3 x daily - 7 x weekly - 1 sets - 6 minutes hold  GOALS: Goals reviewed with patient? Yes  SHORT TERM GOALS: Target date: 05/16/2024  Patient will be independent in home exercise program to improve strength/mobility for better functional independence with ADLs. Baseline: carried forward prior HEP, will benefit form review and progression Goal status: INITIAL   LONG TERM GOALS: Target date: 06/27/2024  Patient will increase SIS 16 score to equal to or greater than 10 points to demonstrate statistically significant improvement in mobility and quality of life.  Baseline: 72% Goal status: INITIAL  2.  Patient (> 22 years old) will complete five times sit to stand test in < 15 seconds indicating an increased LE strength and improved balance. Baseline:  15.92 seconds Goal status: INITIAL  3.  Patient will increase MiniBest Test score to >18/28 to indicate a reduced risk for falling and demonstrate increased independence with functional mobility and ADLs.  Baseline: need to assess  Goal status: INITIAL   4.  Patient will increase FGA   score to >24/30 as to demonstrate reduced fall risk and improved dynamic gait balance for better safety with community/home ambulation. Baseline: need to assess Goal status: INITIAL  5.  Patient will increase 6 min walk test to >1413ft to demonstrate decreased fall risk during functional activities as well as improved access to community mobility.  Baseline: 1284 feet (391 meters, Avg speed 1.67m/s), no AD Goal status: INITIAL   ASSESSMENT:  CLINICAL IMPRESSION:  Patient is a 73 y.o. male who returns today for physical therapy evaluation and treatment following recent L cerebellar, PCA, and MCA CVA as well as following recent hospitalization due to GI bleed. Patient reports feeling greatest deficits are R LE weakness as well as imbalance during gait. Pt demonstrates mild decreased strength in the RLE as noted on MMT as well as decreased functional strength as noted on 5xSTS. Patient also demonstrates significantly reduced gait endurance as noted on 6 min walk test. Patient educated to resume previously prescribed HEP.  Due to increased fall risk and decreased access to community, pt will benefit from skilled PT to allow return to PLOF and increased overall QoL.   OBJECTIVE IMPAIRMENTS: Abnormal gait, cardiopulmonary status limiting activity, decreased activity tolerance, decreased balance, decreased coordination, decreased endurance, decreased mobility, difficulty walking, decreased strength, and improper body mechanics.   ACTIVITY LIMITATIONS: carrying, lifting, squatting, stairs, locomotion level, and caring for others  PARTICIPATION LIMITATIONS: cleaning, laundry, driving, shopping, community activity, occupation, and yard work  PERSONAL FACTORS: Age and 3+ comorbidities: LVAD, Pacemaker, hypotension, Stage 3b CKD  are also affecting patient's functional outcome.   REHAB POTENTIAL: Excellent  CLINICAL DECISION MAKING: Evolving/moderate complexity  EVALUATION COMPLEXITY:  Moderate  PLAN:  PT FREQUENCY: 1-2x/week  PT DURATION: 12 weeks  PLANNED INTERVENTIONS: [id number]- PT Re-evaluation, 97750- Physical Performance Testing, 97110-Therapeutic exercises, 97530- Therapeutic activity, [id number]- Neuromuscular re-education, 97535- Self Care, [id number]- Manual therapy, [id number]- Gait training, [phone]- Canalith repositioning, Patient/Family education, Balance training, Stair training, Taping, Vestibular training, DME instructions, Cryotherapy, Moist heat, and Biofeedback   PLAN FOR NEXT SESSION:  -  - Mini-Best test   - FGA - review previously prescribed  HEP   - pt reports his preference is to walk overground, but agreeable to perform treadmill gait training on an incline for increased challenge of lifting R LE - leg press machine?  - stairwell navigation for full flight of steps     Aarika Moon, PT, DPT, NCS, CSRS Physical Therapist - [facility]  [facility]  6:14 PM 04/04/24

## 2024-04-06 ENCOUNTER — Ambulatory Visit

## 2024-04-06 ENCOUNTER — Ambulatory Visit: Admitting: Speech Pathology

## 2024-04-06 DIAGNOSIS — M6281 Muscle weakness (generalized): Secondary | ICD-10-CM

## 2024-04-06 DIAGNOSIS — R2689 Other abnormalities of gait and mobility: Secondary | ICD-10-CM

## 2024-04-06 DIAGNOSIS — R41841 Cognitive communication deficit: Secondary | ICD-10-CM | POA: Diagnosis not present

## 2024-04-06 DIAGNOSIS — Z95811 Presence of heart assist device: Secondary | ICD-10-CM

## 2024-04-06 DIAGNOSIS — I639 Cerebral infarction, unspecified: Secondary | ICD-10-CM

## 2024-04-06 NOTE — Therapy (Signed)
 OUTPATIENT SPEECH LANGUAGE PATHOLOGY  COGNITIVE COMMUNICATION TREATMENT NOTE   Patient Name: Isaac [name] MRN: [id number] DOB:10/14/1950, 73 y.o., male Today's Date: 04/06/2024  PCP: Lynwood Null, MD REFERRING PROVIDER: Toribio Pitch, PA   End of Session - 04/06/24 0808     Visit Number 9    Number of Visits 25    Date for SLP Re-Evaluation 05/17/24    Authorization Type [employer] HMO/PPO    Progress Note Due on Visit 10    SLP Start Time 0800    SLP Stop Time  0845    SLP Time Calculation (min) 45 min    Activity Tolerance Patient tolerated treatment well          Past Medical History:  Diagnosis Date   Arrhythmia    atrial fibrillation   CHF (congestive heart failure) (HCC)    Chronic kidney disease    Coronary artery disease    Hyperlipidemia    Hypertension    Myocardial infarct [facility])    Past Surgical History:  Procedure Laterality Date   CARDIAC DEFIBRILLATOR PLACEMENT  feb 2014   COLONOSCOPY N/A 03/29/2024   Procedure: COLONOSCOPY;  Surgeon: Legrand Victory LITTIE DOUGLAS, MD;  Location: [facility] ENDOSCOPY;  Service: Gastroenterology;  Laterality: N/A;   ENTEROSCOPY N/A 03/22/2024   Procedure: ENTEROSCOPY;  Surgeon: Leigh Elspeth SQUIBB, MD;  Location: [facility] ENDOSCOPY;  Service: Gastroenterology;  Laterality: N/A;   ESOPHAGOGASTRODUODENOSCOPY N/A 03/29/2024   Procedure: EGD (ESOPHAGOGASTRODUODENOSCOPY);  Surgeon: Legrand Victory LITTIE DOUGLAS, MD;  Location: [facility] ENDOSCOPY;  Service: Gastroenterology;  Laterality: N/A;   ICD GENERATOR CHANGEOUT N/A 02/09/2024   Procedure: ICD GENERATOR CHANGEOUT;  Surgeon: Waddell Danelle ORN, MD;  Location: [facility] INVASIVE CV LAB;  Service: Cardiovascular;  Laterality: N/A;   INSERTION OF IMPLANTABLE LEFT VENTRICULAR ASSIST DEVICE N/A 09/17/2021   Procedure: INSERTION OF IMPLANTABLE LEFT VENTRICULAR ASSIST DEVICE AND INSERTION OF FEMORAL ARTERIAL LINE;  Surgeon: Lucas Dorise POUR, MD;  Location: [facility] OR;  Service: Open Heart Surgery;  Laterality: N/A;   IR FLUORO  GUIDE CV LINE RIGHT  09/27/2021   IR THORACENTESIS ASP PLEURAL SPACE W/IMG GUIDE  09/27/2021   IR US  GUIDE VASC ACCESS RIGHT  09/27/2021   PLACEMENT OF IMPELLA LEFT VENTRICULAR ASSIST DEVICE N/A 09/12/2021   Procedure: PLACEMENT OF IMPELLA 5.5 LEFT VENTRICULAR ASSIST DEVICE;  Surgeon: Kerrin Elspeth BROCKS, MD;  Location: [facility] OR;  Service: Open Heart Surgery;  Laterality: N/A;   RIGHT HEART CATH N/A 09/08/2021   Procedure: RIGHT HEART CATH;  Surgeon: Rolan Ezra RAMAN, MD;  Location: [facility] INVASIVE CV LAB;  Service: Cardiovascular;  Laterality: N/A;   TEE WITHOUT CARDIOVERSION N/A 09/12/2021   Procedure: TRANSESOPHAGEAL ECHOCARDIOGRAM (TEE);  Surgeon: Kerrin Elspeth BROCKS, MD;  Location: [facility] OR;  Service: Open Heart Surgery;  Laterality: N/A;   TEE WITHOUT CARDIOVERSION N/A 09/17/2021   Procedure: TRANSESOPHAGEAL ECHOCARDIOGRAM (TEE);  Surgeon: Lucas Dorise POUR, MD;  Location: [facility] OR;  Service: Open Heart Surgery;  Laterality: N/A;   Patient Active Problem List   Diagnosis Date Noted   Acute gastric ulcer with hemorrhage 03/29/2024   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   Melena 03/22/2024   ABLA (acute blood loss anemia) 03/22/2024   Anticoagulated 03/22/2024   AVM (arteriovenous malformation) of small bowel, acquired with hemorrhage 03/22/2024   Upper GI bleed 03/21/2024   Hematoma 02/17/2024   Anemia 02/15/2024   Hypotension 02/15/2024   Sundowning 02/11/2024   Insomnia 02/11/2024   Constipation 02/11/2024   Stage 3b chronic kidney disease (HCC) 02/11/2024  Cardioembolic stroke (HCC) 02/10/2024   Acute left PCA stroke (HCC) 02/05/2024   Abnormal thyroid  blood test 04/05/2023   Abscess of left groin 11/27/2022   VT (ventricular tachycardia) (HCC) 10/27/2022   ICD (implantable cardioverter-defibrillator) in place 10/27/2022   Pressure injury of skin 10/07/2021   LVAD (left ventricular assist device) present (HCC)    Protein-calorie malnutrition, severe 09/10/2021   Elevated TSH    PICC (peripherally  inserted central catheter) in place    Cardiogenic shock (HCC)    Syncope 09/07/2021   CHF (congestive heart failure) (HCC) 09/06/2021   CAD (coronary artery disease) 08/22/2021   Cardiomyopathy (HCC) 08/22/2021   Mixed hyperlipidemia 08/22/2021   A-fib (HCC) 08/22/2021   Elevated troponin 08/22/2021   Acute on chronic combined systolic and diastolic CHF (congestive heart failure) (HCC) 08/22/2021   Benign essential hypertension 02/22/2015   Chronic systolic heart failure (HCC) 07/05/2014    ONSET DATE: 02/05/2024; date of referral  02/22/2024  REFERRING DIAG: P36.467 (ICD-10-CM) - Cerebral infarction due to unspecified occlusion or stenosis of left posterior cerebral artery  THERAPY DIAG:  Cognitive communication deficit  Rationale for Evaluation and Treatment Rehabilitation  SUBJECTIVE:   PERTINENT HISTORY and DIAGNOSTIC FINDINGS:  Mr. Carbonneau is a 73 y.o. with history of CAD, HTN, atrial fibrillation, CKD IV, VT and chronic systolic CHFs/p HM-3 LVAD on 09/17/21.  He presented to the [facility] ED on 02/05/24 for focal neuro deficits with R vision field cut and dysmetria. Code Stroke activated. CT brain positive for acute to subacute appearing Left PCA and Left cerebellar infarcts. Hyperdense Left PCA suggests occlusion of that vessel. No hemorrhagic transformation or mass effect. Seen by Neuro and decision made to treat with IV heparin . Echocardiogram shows EF 25 to 30% with LVAD at the apex.  Left atrial is moderately dilate. CT angio distal left P2/P3 occlusion.Repeat CT head 02/06/24 with evolving left cerebellar, left PCA and right frontal gyrus MCA branch infarct. No hemorrhage.  Neurology feels infarcts 2/2 LVAD and signed off  Pt attended [facility] CIR from until 02/18/2024, received ST services targeting cognitive linguistic abilities and discharged at MOD [facility] for cognitive linguistic skills. Memory/orientation deficits remain the pt's biggest barrier to progress and  overall safety.   He was admitted on 03/21/2024 from the office for GI bleed with hemoglobin down to 6.4. Coumadin  was held. He was placed on PPI. GI was consulted and he was transfused multiple PRBCs (total of 7 units this admission). He underwent EGD on 7/2 with friable tissue in the duodenum and 4 AVMS which were treated with APC. INR goal was adjusted to 2-2.5. Unfortunately, he continued to have melena and his hemoglobin continued to drop. He underwent repeat EGD + colonoscopy on 7/9  which showed 1 non-bleeding superficial gastric ulcer and 1 clip was placed. Colonoscopy unremarkable. No further bleeding or drop in hemoglobin. INR 1.8 on day of discharge.   PAIN:  Are you having pain? No   FALLS: Has patient fallen in last 6 months?  No  LIVING ENVIRONMENT: Lives with: lives with their spouse Lives in: House/apartment  PLOF:  Level of assistance: Independent with ADLs, Independent with IADLs Employment: Retired   PATIENT GOALS   to improve functional independence  SUBJECTIVE STATEMENT: Pt's wife reports every morning he wakes up with new faculties she stayed in lobby with their grandson Pt accompanied by: self  OBJECTIVE:  TODAY'S TREATMENT:   Skilled treatment session focused on pt's cognitive communication goals. SLP facilitated session by  providing the following interventions:  Pt continues to report feeling better.   Pt benefited from extended time (~ 30 minutes) to complete mildly complex monthly calendar task where he was asked to put appointments on calendar. Initially pt's handwriting was not legible (oh you can't read that) pt was also not able to read his writing after brief delay (several seconds). Significant assist provided for executive function (including details such as times, places, amount for bills), self-monitoring, self-correcting, problem solving was required for pt to achieve 40% accuracy.    PATIENT EDUCATION: Education details: see above Person  educated: Patient (pt's wife stayed in lobby with their grandson) Education method: Explanation Education comprehension: verbalized understanding   HOME EXERCISE PROGRAM:   Set alarms on phone [facility], see above   GOALS:  Goals reviewed with patient? Yes  SHORT TERM GOALS: Target date: 10 sessions  With Mod A, pt will utilize external memory aids to answer questions regarding orientation information with 80% accuracy.  Baseline: Goal status: INITIAL  2.  With Mod A, pt will improve word finding by using strategies to produce generative naming list of equipment and vocabulary related to LVAD.  Baseline:  Goal status: INITIAL  3.  Pt will complete addition cognitive assessment to establish current level of ability.  Baseline: Pt discharged from CIR requiring Mod A for basic cognitive function.  Goal status: INITIAL   LONG TERM GOALS: Target date: 05/17/2024  With supervision A, pt will attend to items on his right to improve safety awareness and accuracy of task completion in 5 out of 7 opportunities.  Baseline:  Goal status: INITIAL  2.  With Min A, pt will external and internal memory aides to help short term and prospective memory in 5 out of 7 opportunities.  Baseline:  Goal status: INITIAL  3.  With supervision A, pt will be able to verbally sequence (using appropriate vocabulary) LVAD features, alerts and switching battery sources.  Baseline:  Goal status: INITIAL   ASSESSMENT:  CLINICAL IMPRESSION: Patient is a 73 y.o. male LVAD patient, who was seen today for a cognitive communication treatment d/t left cerebellar, left PCA and right frontal gyrus MCA branch infarct. Pt presents with severe cognitive impairment that is c/b by severe deficits in attention, awareness (pt is aware of difficulty/poor performance within tasks but unaware of overall impairment and impact on safety), profound memory deficits in immediate recall, short term memory, retrieval,  visual, auditory and prospective memory decreased awareness of confabulatory responses and severe deficits in executive function as observed during visuospatial tasks, specifically task planning, organization, sequencing with pt losing points d/t slow task completion. Overall adequate expressive and receptive communication.   Current goals continue to be appropriate with pt demonstrating progress in today's session. Pt continues to be eager and puts forth great effort. See the above treatment note for details.     OBJECTIVE IMPAIRMENTS include attention, memory, awareness, and executive functioning. These impairments are limiting patient from managing medications, managing appointments, managing finances, household responsibilities, and ADLs/IADLs. Factors affecting potential to achieve goals and functional outcome are ability to learn/carryover information, co-morbidities, medical prognosis, previous level of function, and severity of impairments. Patient will benefit from skilled SLP services to address above impairments and improve overall function.  REHAB POTENTIAL: Good  PLAN: SLP FREQUENCY: 1-2x/week  SLP DURATION: 12 weeks  PLANNED INTERVENTIONS: Language facilitation, Environmental controls, Cueing hierachy, Cognitive reorganization, Internal/external aids, Functional tasks, SLP instruction and feedback, Compensatory strategies, and Patient/family education   Neilani Duffee B.  Rubbie, M.S., CCC-SLP, Tree surgeon Certified Brain Injury Specialist [facility]  [facility] Rehabilitation Services Office [phone] Ascom [phone] Fax [phone]

## 2024-04-06 NOTE — Therapy (Signed)
 OUTPATIENT PHYSICAL THERAPY NEURO TREATMENT   Patient Name: Isaac [name] MRN: [id number] DOB:[birth year], 73 y.o., male Today's Date: 04/06/2024   PCP: Valora Agent, MD  REFERRING PROVIDER: Urbano Albright, MD    END OF SESSION:   PT End of Session - 04/06/24 0836     Visit Number 2    Number of Visits 24    Date for PT Re-Evaluation 06/27/24    PT Start Time 0846    PT Stop Time 0930    PT Time Calculation (min) 44 min    Equipment Utilized During Treatment Gait belt    Activity Tolerance Patient tolerated treatment well    Behavior During Therapy [facility] for tasks assessed/performed           Past Medical History:  Diagnosis Date   Arrhythmia    atrial fibrillation   CHF (congestive heart failure) (HCC)    Chronic kidney disease    Coronary artery disease    Hyperlipidemia    Hypertension    Myocardial infarct [facility])    Past Surgical History:  Procedure Laterality Date   CARDIAC DEFIBRILLATOR PLACEMENT  feb 2014   COLONOSCOPY N/A 03/29/2024   Procedure: COLONOSCOPY;  Surgeon: Legrand Victory LITTIE DOUGLAS, MD;  Location: [facility] ENDOSCOPY;  Service: Gastroenterology;  Laterality: N/A;   ENTEROSCOPY N/A 03/22/2024   Procedure: ENTEROSCOPY;  Surgeon: Leigh Elspeth SQUIBB, MD;  Location: [facility] ENDOSCOPY;  Service: Gastroenterology;  Laterality: N/A;   ESOPHAGOGASTRODUODENOSCOPY N/A 03/29/2024   Procedure: EGD (ESOPHAGOGASTRODUODENOSCOPY);  Surgeon: Legrand Victory LITTIE DOUGLAS, MD;  Location: [facility] ENDOSCOPY;  Service: Gastroenterology;  Laterality: N/A;   ICD GENERATOR CHANGEOUT N/A 02/09/2024   Procedure: ICD GENERATOR CHANGEOUT;  Surgeon: Waddell Danelle ORN, MD;  Location: [facility] INVASIVE CV LAB;  Service: Cardiovascular;  Laterality: N/A;   INSERTION OF IMPLANTABLE LEFT VENTRICULAR ASSIST DEVICE N/A 09/17/2021   Procedure: INSERTION OF IMPLANTABLE LEFT VENTRICULAR ASSIST DEVICE AND INSERTION OF FEMORAL ARTERIAL LINE;  Surgeon: Lucas Dorise POUR, MD;  Location: [facility] OR;  Service: Open Heart Surgery;   Laterality: N/A;   IR FLUORO GUIDE CV LINE RIGHT  09/27/2021   IR THORACENTESIS ASP PLEURAL SPACE W/IMG GUIDE  09/27/2021   IR US  GUIDE VASC ACCESS RIGHT  09/27/2021   PLACEMENT OF IMPELLA LEFT VENTRICULAR ASSIST DEVICE N/A 09/12/2021   Procedure: PLACEMENT OF IMPELLA 5.5 LEFT VENTRICULAR ASSIST DEVICE;  Surgeon: Kerrin Elspeth BROCKS, MD;  Location: [facility] OR;  Service: Open Heart Surgery;  Laterality: N/A;   RIGHT HEART CATH N/A 09/08/2021   Procedure: RIGHT HEART CATH;  Surgeon: Rolan Ezra RAMAN, MD;  Location: [facility] INVASIVE CV LAB;  Service: Cardiovascular;  Laterality: N/A;   TEE WITHOUT CARDIOVERSION N/A 09/12/2021   Procedure: TRANSESOPHAGEAL ECHOCARDIOGRAM (TEE);  Surgeon: Kerrin Elspeth BROCKS, MD;  Location: [facility] OR;  Service: Open Heart Surgery;  Laterality: N/A;   TEE WITHOUT CARDIOVERSION N/A 09/17/2021   Procedure: TRANSESOPHAGEAL ECHOCARDIOGRAM (TEE);  Surgeon: Lucas Dorise POUR, MD;  Location: [facility] OR;  Service: Open Heart Surgery;  Laterality: N/A;   Patient Active Problem List   Diagnosis Date Noted   Acute gastric ulcer with hemorrhage 03/29/2024   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   Melena 03/22/2024   ABLA (acute blood loss anemia) 03/22/2024   Anticoagulated 03/22/2024   AVM (arteriovenous malformation) of small bowel, acquired with hemorrhage 03/22/2024   Upper GI bleed 03/21/2024   Hematoma 02/17/2024   Anemia 02/15/2024   Hypotension 02/15/2024   Sundowning 02/11/2024   Insomnia 02/11/2024   Constipation 02/11/2024   Stage  3b chronic kidney disease (HCC) 02/11/2024   Cardioembolic stroke (HCC) 02/10/2024   Acute left PCA stroke (HCC) 02/05/2024   Abnormal thyroid  blood test 04/05/2023   Abscess of left groin 11/27/2022   VT (ventricular tachycardia) (HCC) 10/27/2022   ICD (implantable cardioverter-defibrillator) in place 10/27/2022   Pressure injury of skin 10/07/2021   LVAD (left ventricular assist device) present (HCC)    Protein-calorie malnutrition, severe 09/10/2021   Elevated  TSH    PICC (peripherally inserted central catheter) in place    Cardiogenic shock (HCC)    Syncope 09/07/2021   CHF (congestive heart failure) (HCC) 09/06/2021   CAD (coronary artery disease) 08/22/2021   Cardiomyopathy (HCC) 08/22/2021   Mixed hyperlipidemia 08/22/2021   A-fib (HCC) 08/22/2021   Elevated troponin 08/22/2021   Acute on chronic combined systolic and diastolic CHF (congestive heart failure) (HCC) 08/22/2021   Benign essential hypertension 02/22/2015   Chronic systolic heart failure (HCC) 07/05/2014    ONSET DATE: 02/05/24  REFERRING DIAG:  P36.467 (ICD-10-CM) - Cerebral infarction due to unspecified occlusion or stenosis of left posterior cerebral artery   THERAPY DIAG:  Muscle weakness (generalized)  Cardioembolic stroke (HCC)  LVAD (left ventricular assist device) present (HCC)  Imbalance  Rationale for Evaluation and Treatment: Rehabilitation  SUBJECTIVE:                                                                                                                                                                                             SUBJECTIVE STATEMENT: Pt has not been overly compliant with his HEP. Denies falls.  Pt accompanied by: self   PERTINENT HISTORY:   From 04/02/2024 D/C note from hospital: He was admitted on 03/21/2024 from the office for GI bleed with hemoglobin down to 6.4. Coumadin  was held. He was placed on PPI. GI was consulted and he was transfused multiple PRBCs (total of 7 units this admission). He underwent EGD on 7/2 with friable tissue in the duodenum and 4 AVMS which were treated with APC. INR goal was adjusted to 2-2.5. Unfortunately, he continued to have melena and his hemoglobin continued to drop. He underwent repeat EGD + colonoscopy on 7/9  which showed 1 non-bleeding superficial gastric ulcer and 1 clip was placed. Colonoscopy unremarkable. No further bleeding or drop in hemoglobin. INR 1.8 on day of discharge.   Presented  02/05/2024 when his pacemaker went off abruptly resulting in a fall as well as noted right sided weakness and aphasia with blurred vision. Cranial CT scan positive for acute to subacute appearing left PCA left cerebellar infarction. Hyperdense left PCA suggesting occlusion of that vessel. He was  also diagnosed with right frontal gyrus MCA infarction. No hemorrhagic transformation or mass effect. Underlying advanced chronic small vessel disease. Right supraorbital forehead recent soft tissue injury with no skull fracture identified. CTA head and neck demonstrating subtotal occlusion of the left PCA in the P2/P3 junction and additionally moderate to severe bilateral ICA siphon atherosclerosis with a least moderate siphon stenosis bilaterally.  Admitted to  rehab 02/10/2024 for inpatient therapies to consist of PT, ST and OT at least three hours five days a week. Past admission physiatrist, therapy team and rehab RN have worked together to provide customized collaborative inpatient rehab.  Pertaining to patient's cardioembolic left cerebellar, PCA and right frontal MCA branch infarction secondary to LVAD device despite being on anticoagulation with warfarin.  Patient remained on Coumadin  therapy goal INR 2.5-3.0.  Atrial fibrillation/VT chronic systolic congestive heart failure status post LVAD 09/17/2021.  Continued Revatio  followed by heart failure team status post generator change 02/09/2024.   PAIN:  Are you having pain? No  PRECAUTIONS: ICD/Pacemaker and Other: LVAD  RED FLAGS: None and LVAD   WEIGHT BEARING RESTRICTIONS: LVAD and pace maker.   FALLS: Has patient fallen in last 6 months? No  LIVING ENVIRONMENT: Lives with: lives with their spouse Stevie) Lives in: House/apartment Stairs: Yes: Internal: 9 steps; on left going up Has following equipment at home: Single point cane, Walker - 2 wheeled, and does not use  PLOF: Independent with basic ADLs and Independent with household mobility  without device, driving  PATIENT GOALS: pt reports the sight in R visual field is limited. Improve strength in R LE primarily. Improve balance slightly.   OBJECTIVE:  Note: Objective measures were completed at Evaluation unless otherwise noted.  DIAGNOSTIC FINDINGS:  CT head: IMPRESSION: 1. Expected evolution of Left cerebellar and Left PCA infarcts since yesterday. No hemorrhage or mass effect. 2. Evolution also of a Right MCA middle frontal gyrus infarct predicted by abnormal CTP T-max yesterday. No hemorrhage or mass effect. 3. Consider a recent Embolic event given the recent anterior and posterior vascular territory infarcts.  CT neck  IMPRESSION: 1. CTP does not detect the known Left PCA infarct core (although T-max oligemia corresponds to that finding), while CTA demonstrates subtotal occlusion of the Left PCA at the P2/P3 junction (some distal flow).   2. CTP also suggests Oligemia in the anterior Right MCA division, although no MCA branch occlusion identified by CTA.   3. The above were discussed by telephone with Dr. Rosemarie on 02/05/2024 at 12:36 .   4. Additionally, Moderate to Severe bilateral ICA siphon atherosclerosis with at least Moderate siphon stenosis bilaterally (Moderate to Severe at the Right anterior genu).   5. Extracranial atherosclerosis but no significant Vertebral artery or extracranial carotid stenosis.   6. Aortic Atherosclerosis (ICD10-I70.0) and Emphysema (ICD10-J43.9).  DG chest FINDINGS: Single frontal view of the chest demonstrates stable left ventricular assist device and multi lead pacer/AICD. Cardiac silhouette remains enlarged. No acute airspace disease, effusion, or pneumothorax. No acute bony abnormalities.     COGNITION: Overall cognitive status: delayed response to history questions, see SLP note for more details on cognitive/speech deficits    SENSATION: WFL  COORDINATION:   Slight decreased speed of movement on the RLE>L  LE  POSTURE: forward head  LOWER EXTREMITY ROM:     AROM. WFL  LOWER EXTREMITY MMT:    MMT Right Eval Left Eval  Hip flexion 4 5  Hip extension    Hip abduction 4+ 5  Hip adduction 4+  5  Hip internal rotation    Hip external rotation    Knee flexion 4 5  Knee extension 4+ 5  Ankle dorsiflexion 4+ 4+  Ankle plantarflexion 4+ 4+  Ankle inversion    Ankle eversion    (Blank rows = not tested)  Manual Muscle Test Scale 0/5 = No muscle contraction can be seen or felt 1/5 = Contraction can be felt, but there is no motion 2-/5 = Part moves through incomplete ROM w/ gravity decreased 2/5 = Part moves through complete ROM w/ gravity decreased 2+/5 = Part moves through incomplete ROM (<50%) against gravity or through complete ROM w/ gravity 3-/5 = Part moves through incomplete ROM (>50%) against gravity 3/5 = Part moves through complete ROM against gravity 3+/5 = Part moves through complete ROM against gravity/slight resistance 4-/5= Holds test position against slight to moderate pressure 4/5 = Part moves through complete ROM against gravity/moderate resistance 4+/5= Holds test position against moderate to strong pressure 5/5 = Part moves through complete ROM against gravity/full resistance  BED MOBILITY:  Not tested  TRANSFERS: Sit to stand: Complete Independence  Assistive device utilized: None     Stand to sit: Complete Independence  Assistive device utilized: None     Chair to chair: Complete Independence  Assistive device utilized: None       RAMP:  Not tested  CURB:  Not tested  STAIRS: Not tested GAIT: Findings: Gait Characteristics: occasional decreased R foot clearance during swing, step through pattern, decreased arm swing- Right, decreased arm swing- Left, and decreased stance time- Right, Distance walked: 60, Assistive device utilized:None, and Level of assistance: Modified independence and SBA  FUNCTIONAL TESTS:  5 times sit to stand: 15.92 seconds 6  minute walk test: 1284 feet (391 meters, Avg speed 1.10m/s), no AD 10 meter walk test: need to assess FGA: need to assess Mini-Best Test: need to assess  PATIENT SURVEYS:  Stroke Impact Scale 72%                                                                                                                               TREATMENT DATE: 04/06/2024   Unless otherwise stated, SBA was provided and gait belt donned in order to ensure pt safety throughout session.  Physical Performance: 10 MWT:    Self selected: 1.08 m/s   Fast: 1.27 m/s FGA  MINI BESTEST   [facility] PT Assessment - 04/06/24 0852       Standardized Balance Assessment   Standardized Balance Assessment Mini-BESTest      Mini-BESTest   Sit To Stand Normal: Comes to stand without use of hands and stabilizes independently.    Rise to Toes Normal: Stable for 3 s with maximum height.    Stand on one leg (left) Moderate: < 20 s    Stand on one leg (right) Moderate: < 20 s    Stand on one leg - lowest score 1  Compensatory Stepping Correction - Forward Moderate: More than one step is required to recover equilibrium    Compensatory Stepping Correction - Backward Moderate: More than one step is required to recover equilibrium    Compensatory Stepping Correction - Left Lateral Moderate: Several steps to recover equilibrium    Compensatory Stepping Correction - Right Lateral Moderate: Several steps to recover equilibrium    Stepping Corredtion Lateral - lowest score 1    Stance - Feet together, eyes open, firm surface  Normal: 30s    Stance - Feet together, eyes closed, foam surface  Normal: 30s    Incline - Eyes Closed Normal: Stands independently 30s and aligns with gravity    Change in Gait Speed Normal: Significantly changes walkling speed without imbalance    Walk with head turns - Horizontal Moderate: performs head turns with reduction in gait speed.    Walk with pivot turns Normal: Turns with feet close FAST (< 3 steps)  with good balance.    Step over obstacles Normal: Able to step over box with minimal change of gait speed and with good balance.    Timed UP & GO with Dual Task Severe: Stops counting while walking OR stops walking while counting.    Mini-BEST total score 21      Functional Gait  Assessment   Gait assessed  Yes    Gait Level Surface Walks 20 ft in less than 5.5 sec, no assistive devices, good speed, no evidence for imbalance, normal gait pattern, deviates no more than 6 in outside of the 12 in walkway width.    Change in Gait Speed Able to change speed, demonstrates mild gait deviations, deviates 6-10 in outside of the 12 in walkway width, or no gait deviations, unable to achieve a major change in velocity, or uses a change in velocity, or uses an assistive device.    Gait with Horizontal Head Turns Performs head turns smoothly with slight change in gait velocity (eg, minor disruption to smooth gait path), deviates 6-10 in outside 12 in walkway width, or uses an assistive device.    Gait with Vertical Head Turns Performs head turns with no change in gait. Deviates no more than 6 in outside 12 in walkway width.    Gait and Pivot Turn Pivot turns safely within 3 sec and stops quickly with no loss of balance.    Step Over Obstacle Is able to step over 2 stacked shoe boxes taped together (9 in total height) without changing gait speed. No evidence of imbalance.    Gait with Narrow Base of Support Ambulates 7-9 steps.    Gait with Eyes Closed Walks 20 ft, uses assistive device, slower speed, mild gait deviations, deviates 6-10 in outside 12 in walkway width. Ambulates 20 ft in less than 9 sec but greater than 7 sec.    Ambulating Backwards Walks 20 ft, no assistive devices, good speed, no evidence for imbalance, normal gait    Steps Alternating feet, no rail.    Total Score 26         There.ex:   Reviewed previous issued HEP with pt. Discussed step up variations such as lateral compared to forward.     Forward leading RLE to 6 step: x10    R lateral to 6 step: x10   PATIENT EDUCATION: Education details: therapy POC, findings from assessment, continuation of prior HEP Person educated: Patient Education method: Explanation Education comprehension: verbalized understanding and needs further education  HOME EXERCISE PROGRAM:  Access  Code: [email or web address] URL: [email or web address] Date: 03/02/2024 Prepared by: Connell Kiss   Exercises - Tandem Stance with Support  - 1 x daily - 4 x weekly - 3 sets - 4 reps - 20 seconds  hold - Forward and Backward Walking with Eyes Closed and Counter Support  - 1 x daily - 4 x weekly - 3 sets - 5 reps - Side Stepping with Resistance at Thighs and Counter Support  - 1 x daily - 7 x weekly - 2 sets - 10 reps - Forward Step Up with Unilateral Counter Support  - 1 x daily - 7 x weekly - 1-2 sets - 10 reps - Walking  - 3 x daily - 7 x weekly - 1 sets - 6 minutes hold  GOALS: Goals reviewed with patient? Yes  SHORT TERM GOALS: Target date: 05/16/2024  Patient will be independent in home exercise program to improve strength/mobility for better functional independence with ADLs. Baseline: carried forward prior HEP, will benefit form review and progression Goal status: INITIAL   LONG TERM GOALS: Target date: 06/27/2024  Patient will increase SIS 16 score to equal to or greater than 10 points to demonstrate statistically significant improvement in mobility and quality of life.  Baseline: 72% Goal status: INITIAL  2.  Patient (> 29 years old) will complete five times sit to stand test in < 15 seconds indicating an increased LE strength and improved balance. Baseline:  15.92 seconds Goal status: INITIAL  3.  Patient will increase MiniBest Test score by at least 5 more points to indicate a reduced risk for falling and demonstrate increased independence with functional mobility and ADLs.  Baseline: need to assess; 04/06/24: 21/28 Goal status:  ON GOING   4.  Patient will increase FGA  score to >24/30 as to demonstrate reduced fall risk and improved dynamic gait balance for better safety with community/home ambulation. Baseline: need to assess; 04/06/24: 26/30 Goal status: MET  5.  Patient will increase 6 min walk test to >1470ft to demonstrate decreased fall risk during functional activities as well as improved access to community mobility.  Baseline: 1284 feet (391 meters, Avg speed 1.63m/s), no AD Goal status: INITIAL   ASSESSMENT:  CLINICAL IMPRESSION:  Patient returning for first treatment session. Continuing POC with assessment today on dynamic balance and gait speed performing FGA, miniBESTest and 10 meter gait. Goals updated as necessary. Pt presents with lowered risk for falls with dynamic movement as evidenced by testing. Pt does demo functional weakness in RLE noted with SLS and tandem balance. Also has most deficits with head position changes with gait. Encouraged pt to re-initiate HEP. Discussed prognosis and testing results and how they relate to falls risk. Due to increased fall risk and decreased access to community, pt will benefit from skilled PT to allow return to PLOF and increased overall QoL.   OBJECTIVE IMPAIRMENTS: Abnormal gait, cardiopulmonary status limiting activity, decreased activity tolerance, decreased balance, decreased coordination, decreased endurance, decreased mobility, difficulty walking, decreased strength, and improper body mechanics.   ACTIVITY LIMITATIONS: carrying, lifting, squatting, stairs, locomotion level, and caring for others  PARTICIPATION LIMITATIONS: cleaning, laundry, driving, shopping, community activity, occupation, and yard work  PERSONAL FACTORS: Age and 3+ comorbidities: LVAD, Pacemaker, hypotension, Stage 3b CKD  are also affecting patient's functional outcome.   REHAB POTENTIAL: Excellent  CLINICAL DECISION MAKING: Evolving/moderate complexity  EVALUATION COMPLEXITY:  Moderate  PLAN:  PT FREQUENCY: 1-2x/week  PT DURATION: 12 weeks  PLANNED INTERVENTIONS: 97164- PT Re-evaluation, 97750- Physical Performance  Testing, 97110-Therapeutic exercises, 97530- Therapeutic activity, [id number]- Neuromuscular re-education, [phone]- Self Care, [id number]- Manual therapy, [phone]- Gait training, [phone]- Canalith repositioning, Patient/Family education, Balance training, Stair training, Taping, Vestibular training, DME instructions, Cryotherapy, Moist heat, and Biofeedback   PLAN FOR NEXT SESSION:  RLE strength   - pt reports his preference is to walk overground, but agreeable to perform treadmill gait training on an incline for increased challenge of lifting R LE - leg press machine?  - stairwell navigation for full flight of steps    Dorina HERO. Fairly IV, PT, DPT Physical Therapist- [place]  [facility] 10:22 AM 04/06/24

## 2024-04-07 ENCOUNTER — Ambulatory Visit (HOSPITAL_COMMUNITY): Payer: Self-pay | Admitting: Pharmacist

## 2024-04-07 LAB — POCT INR: INR: 2.5 (ref 2.0–3.0)

## 2024-04-10 ENCOUNTER — Ambulatory Visit

## 2024-04-10 ENCOUNTER — Ambulatory Visit: Admitting: Speech Pathology

## 2024-04-10 DIAGNOSIS — I639 Cerebral infarction, unspecified: Secondary | ICD-10-CM

## 2024-04-10 DIAGNOSIS — Z95811 Presence of heart assist device: Secondary | ICD-10-CM

## 2024-04-10 DIAGNOSIS — M6281 Muscle weakness (generalized): Secondary | ICD-10-CM

## 2024-04-10 DIAGNOSIS — R41841 Cognitive communication deficit: Secondary | ICD-10-CM

## 2024-04-10 DIAGNOSIS — R2689 Other abnormalities of gait and mobility: Secondary | ICD-10-CM

## 2024-04-10 NOTE — Therapy (Addendum)
 OUTPATIENT PHYSICAL THERAPY TREATMENT   Patient Name: Isaac [name] MRN: [id number] DOB:June 15, 1951, 73 y.o., male Today's Date: 04/10/2024   PCP: Valora Agent, MD  REFERRING PROVIDER: Urbano Albright, MD    END OF SESSION:   PT End of Session - 04/10/24 1103     Visit Number 3    Number of Visits 24    Date for PT Re-Evaluation 06/27/24    PT Start Time 1100    PT Stop Time 1140    PT Time Calculation (min) 40 min    Equipment Utilized During Treatment Gait belt    Activity Tolerance Patient tolerated treatment well;No increased pain    Behavior During Therapy [facility] for tasks assessed/performed           Past Medical History:  Diagnosis Date   Arrhythmia    atrial fibrillation   CHF (congestive heart failure) (HCC)    Chronic kidney disease    Coronary artery disease    Hyperlipidemia    Hypertension    Myocardial infarct [facility])    Past Surgical History:  Procedure Laterality Date   CARDIAC DEFIBRILLATOR PLACEMENT  feb 2014   COLONOSCOPY N/A 03/29/2024   Procedure: COLONOSCOPY;  Surgeon: Legrand Victory LITTIE DOUGLAS, MD;  Location: [facility] ENDOSCOPY;  Service: Gastroenterology;  Laterality: N/A;   ENTEROSCOPY N/A 03/22/2024   Procedure: ENTEROSCOPY;  Surgeon: Leigh Elspeth SQUIBB, MD;  Location: [facility] ENDOSCOPY;  Service: Gastroenterology;  Laterality: N/A;   ESOPHAGOGASTRODUODENOSCOPY N/A 03/29/2024   Procedure: EGD (ESOPHAGOGASTRODUODENOSCOPY);  Surgeon: Legrand Victory LITTIE DOUGLAS, MD;  Location: [facility] ENDOSCOPY;  Service: Gastroenterology;  Laterality: N/A;   ICD GENERATOR CHANGEOUT N/A 02/09/2024   Procedure: ICD GENERATOR CHANGEOUT;  Surgeon: Waddell Danelle ORN, MD;  Location: [facility] INVASIVE CV LAB;  Service: Cardiovascular;  Laterality: N/A;   INSERTION OF IMPLANTABLE LEFT VENTRICULAR ASSIST DEVICE N/A 09/17/2021   Procedure: INSERTION OF IMPLANTABLE LEFT VENTRICULAR ASSIST DEVICE AND INSERTION OF FEMORAL ARTERIAL LINE;  Surgeon: Lucas Dorise POUR, MD;  Location: [facility] OR;  Service: Open Heart  Surgery;  Laterality: N/A;   IR FLUORO GUIDE CV LINE RIGHT  09/27/2021   IR THORACENTESIS ASP PLEURAL SPACE W/IMG GUIDE  09/27/2021   IR US  GUIDE VASC ACCESS RIGHT  09/27/2021   PLACEMENT OF IMPELLA LEFT VENTRICULAR ASSIST DEVICE N/A 09/12/2021   Procedure: PLACEMENT OF IMPELLA 5.5 LEFT VENTRICULAR ASSIST DEVICE;  Surgeon: Kerrin Elspeth BROCKS, MD;  Location: [facility] OR;  Service: Open Heart Surgery;  Laterality: N/A;   RIGHT HEART CATH N/A 09/08/2021   Procedure: RIGHT HEART CATH;  Surgeon: Rolan Ezra RAMAN, MD;  Location: [facility] INVASIVE CV LAB;  Service: Cardiovascular;  Laterality: N/A;   TEE WITHOUT CARDIOVERSION N/A 09/12/2021   Procedure: TRANSESOPHAGEAL ECHOCARDIOGRAM (TEE);  Surgeon: Kerrin Elspeth BROCKS, MD;  Location: [facility] OR;  Service: Open Heart Surgery;  Laterality: N/A;   TEE WITHOUT CARDIOVERSION N/A 09/17/2021   Procedure: TRANSESOPHAGEAL ECHOCARDIOGRAM (TEE);  Surgeon: Lucas Dorise POUR, MD;  Location: [facility] OR;  Service: Open Heart Surgery;  Laterality: N/A;   Patient Active Problem List   Diagnosis Date Noted   Acute gastric ulcer with hemorrhage 03/29/2024   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   Melena 03/22/2024   ABLA (acute blood loss anemia) 03/22/2024   Anticoagulated 03/22/2024   AVM (arteriovenous malformation) of small bowel, acquired with hemorrhage 03/22/2024   Upper GI bleed 03/21/2024   Hematoma 02/17/2024   Anemia 02/15/2024   Hypotension 02/15/2024   Sundowning 02/11/2024   Insomnia 02/11/2024   Constipation 02/11/2024  Stage 3b chronic kidney disease (HCC) 02/11/2024   Cardioembolic stroke (HCC) 02/10/2024   Acute left PCA stroke (HCC) 02/05/2024   Abnormal thyroid  blood test 04/05/2023   Abscess of left groin 11/27/2022   VT (ventricular tachycardia) (HCC) 10/27/2022   ICD (implantable cardioverter-defibrillator) in place 10/27/2022   Pressure injury of skin 10/07/2021   LVAD (left ventricular assist device) present (HCC)    Protein-calorie malnutrition, severe 09/10/2021    Elevated TSH    PICC (peripherally inserted central catheter) in place    Cardiogenic shock (HCC)    Syncope 09/07/2021   CHF (congestive heart failure) (HCC) 09/06/2021   CAD (coronary artery disease) 08/22/2021   Cardiomyopathy (HCC) 08/22/2021   Mixed hyperlipidemia 08/22/2021   A-fib (HCC) 08/22/2021   Elevated troponin 08/22/2021   Acute on chronic combined systolic and diastolic CHF (congestive heart failure) (HCC) 08/22/2021   Benign essential hypertension 02/22/2015   Chronic systolic heart failure (HCC) 07/05/2014    ONSET DATE: 02/05/24  REFERRING DIAG:  P36.467 (ICD-10-CM) - Cerebral infarction due to unspecified occlusion or stenosis of left posterior cerebral artery   THERAPY DIAG:  Muscle weakness (generalized)  Cardioembolic stroke (HCC)  LVAD (left ventricular assist device) present (HCC)  Imbalance  Rationale for Evaluation and Treatment: Rehabilitation  SUBJECTIVE:                                                                                                                                                                                             SUBJECTIVE STATEMENT: Pt denies any updates feels good. HAs not restarted his HEP from June.   PERTINENT HISTORY:  Presented 02/05/2024 s/p fall, right sided weakness and aphasia with blurred vision. CT positive for acute  left PCA left cerebellar infarction. Admitted to  rehab 02/10/2024 for inpatient therapies to consist of PT, ST and OT at least three hours five days a week. Started OPPT here, for 7 visits, then admitted to [facility] for GIB 03/21/24. PMH:  Atrial fibrillation/VT chronic systolic congestive heart failure status post LVAD 09/17/2021.   PAIN:  Are you having pain? No  PRECAUTIONS: ICD/Pacemaker and Other: LVAD  RED FLAGS: None and LVAD   WEIGHT BEARING RESTRICTIONS: LVAD and pace maker.   FALLS: Has patient fallen in last 6 months? No  LIVING ENVIRONMENT: Lives with: lives with their spouse  Stevie) Lives in: House/apartment Stairs: Yes: Internal: 9 steps; on left going up Has following equipment at home: Single point cane, Walker - 2 wheeled, and does not use  PLOF: Independent with basic ADLs and Independent with household mobility without device, driving  PATIENT GOALS: pt reports the sight  in R visual field is limited. Improve strength in R LE primarily. Improve balance slightly.   OBJECTIVE:  Note: Objective measures were completed at Evaluation unless otherwise noted.    LOWER EXTREMITY ROM:    AROM. WFL  GAIT: Findings: Gait Characteristics: occasional decreased R foot clearance during swing, step through pattern, decreased arm swing- Right, decreased arm swing- Left, and decreased stance time- Right, Distance walked: 60, Assistive device utilized:None, and Level of assistance: Modified independence and SBA  FUNCTIONAL TESTS:  5 times sit to stand: 15.92 seconds 6 minute walk test: 1284 feet (391 meters, Avg speed 1.55m/s), no AD 10 meter walk test: need to assess FGA: need to assess Mini-Best Test: need to assess  PATIENT SURVEYS:  Stroke Impact Scale 72%                                                                                                                              TREATMENT DATE: 04/10/2024 -STS from chair 2x10 hands free  -1330ft overground AMB 7 minutes 0 sec (no asymmetry of step/gait seen)  -tandem stance 2x30sec bilat (2-3 LOB with self righting on support bar) -tandem walking on balance beam in // bars 10x  -42ft AMB with 5lbAW RLE  -cable resisted walking in 4 directions with 5lb AW RLE (minGUard assist)    PATIENT EDUCATION: Education details: therapy POC, findings from assessment, continuation of prior HEP Person educated: Patient Education method: Explanation Education comprehension: verbalized understanding and needs further education  HOME EXERCISE PROGRAM: Access Code: [email or web address] URL:  [email or web address] Date: 04/10/2024 Prepared by: Peggye Linear  Exercises - Tandem Stance with Support  - 1 x daily - 4 x weekly - 3 sets - 4 reps - 20 seconds  hold - Forward and Backward Walking with Eyes Closed and Counter Support  - 1 x daily - 4 x weekly - 3 sets - 5 reps - Side Stepping with Resistance at Thighs and Counter Support  - 1 x daily - 7 x weekly - 2 sets - 10 reps - Forward Step Up with Unilateral Counter Support  - 1 x daily - 7 x weekly - 1-2 sets - 10 reps - Walking  - 3 x daily - 7 x weekly - 1 sets - 6 minutes hold - Sit to Stand with Arms Crossed  - 3 x daily - 7 x weekly - 1 sets - 10 reps  GOALS: Goals reviewed with patient? Yes SHORT TERM GOALS: Target date: 05/16/2024  Patient will be independent in home exercise program to improve strength/mobility for better functional independence with ADLs. Baseline: carried forward prior HEP, will benefit form review and progression Goal status: INITIAL  LONG TERM GOALS: Target date: 06/27/2024  Patient will increase SIS 16 score to equal to or greater than 10 points to demonstrate statistically significant improvement in mobility and quality of life.  Baseline: 72% Goal status: INITIAL  2.  Patient (> 60 years old) will complete five  times sit to stand test in < 15 seconds indicating an increased LE strength and improved balance. Baseline:  15.92 seconds Goal status: INITIAL  3.  Patient will increase MiniBest Test score by at least 5 more points to indicate a reduced risk for falling and demonstrate increased independence with functional mobility and ADLs.  Baseline: need to assess; 04/06/24: 21/28 Goal status: ON GOING   4.  Patient will increase FGA  score to >24/30 as to demonstrate reduced fall risk and improved dynamic gait balance for better safety with community/home ambulation. Baseline: need to assess; 04/06/24: 26/30 Goal status: MET  5.  Patient will increase 6 min walk test to >1450ft  to demonstrate decreased fall risk during functional activities as well as improved access to community mobility.  Baseline: 1284 feet (391 meters, Avg speed 1.71m/s), no AD Goal status: INITIAL   ASSESSMENT:  CLINICAL IMPRESSION:   Due to increased fall risk and decreased access to community, pt will benefit from skilled PT to allow return to PLOF and increased overall QoL.   OBJECTIVE IMPAIRMENTS: Abnormal gait, cardiopulmonary status limiting activity, decreased activity tolerance, decreased balance, decreased coordination, decreased endurance, decreased mobility, difficulty walking, decreased strength, and improper body mechanics.   ACTIVITY LIMITATIONS: carrying, lifting, squatting, stairs, locomotion level, and caring for others  PARTICIPATION LIMITATIONS: cleaning, laundry, driving, shopping, community activity, occupation, and yard work  PERSONAL FACTORS: Age and 3+ comorbidities: LVAD, Pacemaker, hypotension, Stage 3b CKD  are also affecting patient's functional outcome.   REHAB POTENTIAL: Excellent  CLINICAL DECISION MAKING: Evolving/moderate complexity  EVALUATION COMPLEXITY: Moderate  PLAN:  PT FREQUENCY: 1-2x/week  PT DURATION: 12 weeks  PLANNED INTERVENTIONS: 97164- PT Re-evaluation, 97750- Physical Performance Testing, 97110-Therapeutic exercises, 97530- Therapeutic activity, 97112- Neuromuscular re-education, 97535- Self Care, [id number]- Manual therapy, [phone]- Gait training, [phone]- Canalith repositioning, Patient/Family education, Balance training, Stair training, Taping, Vestibular training, DME instructions, Cryotherapy, Moist heat, and Biofeedback   PLAN FOR NEXT SESSION:  FU on HEP restart; advance interventions  11:04 AM, 04/10/24 Peggye JAYSON Linear, PT, DPT Physical Therapist - [facility]  Outpatient Physical Therapy- Main Campus [phone]

## 2024-04-10 NOTE — Therapy (Signed)
 OUTPATIENT SPEECH LANGUAGE PATHOLOGY  COGNITIVE COMMUNICATION TREATMENT  AND PROGRESS NOTE   Patient Name: Isaac [name] MRN: [id number] DOB:1951-02-10, 73 y.o., male Today's Date: 04/10/2024  PCP: Lynwood Null, MD REFERRING PROVIDER: Toribio Pitch, PA  Speech Therapy Progress Note  Dates of Reporting Period: 02/23/24/ to 04/10/24   Objective: Patient has been seen for 10 speech therapy sessions this reporting period targeting cognitive communication. Patient is making progress toward LTGs and met 3/3 STGs this reporting period. See skilled intervention, clinical impressions, and goals below for details.    End of Session - 04/10/24 1022     Visit Number 10    Number of Visits 25    Date for SLP Re-Evaluation 05/17/24    Authorization Type [employer] HMO/PPO    Progress Note Due on Visit 10    SLP Start Time 1017    SLP Stop Time  1100    SLP Time Calculation (min) 43 min    Activity Tolerance Patient tolerated treatment well          Past Medical History:  Diagnosis Date   Arrhythmia    atrial fibrillation   CHF (congestive heart failure) (HCC)    Chronic kidney disease    Coronary artery disease    Hyperlipidemia    Hypertension    Myocardial infarct [facility])    Past Surgical History:  Procedure Laterality Date   CARDIAC DEFIBRILLATOR PLACEMENT  feb 2014   COLONOSCOPY N/A 03/29/2024   Procedure: COLONOSCOPY;  Surgeon: Legrand Victory LITTIE DOUGLAS, MD;  Location: [facility] ENDOSCOPY;  Service: Gastroenterology;  Laterality: N/A;   ENTEROSCOPY N/A 03/22/2024   Procedure: ENTEROSCOPY;  Surgeon: Leigh Elspeth SQUIBB, MD;  Location: [facility] ENDOSCOPY;  Service: Gastroenterology;  Laterality: N/A;   ESOPHAGOGASTRODUODENOSCOPY N/A 03/29/2024   Procedure: EGD (ESOPHAGOGASTRODUODENOSCOPY);  Surgeon: Legrand Victory LITTIE DOUGLAS, MD;  Location: [facility] ENDOSCOPY;  Service: Gastroenterology;  Laterality: N/A;   ICD GENERATOR CHANGEOUT N/A 02/09/2024   Procedure: ICD GENERATOR CHANGEOUT;  Surgeon: Waddell Danelle ORN, MD;  Location: [facility] INVASIVE CV LAB;  Service: Cardiovascular;  Laterality: N/A;   INSERTION OF IMPLANTABLE LEFT VENTRICULAR ASSIST DEVICE N/A 09/17/2021   Procedure: INSERTION OF IMPLANTABLE LEFT VENTRICULAR ASSIST DEVICE AND INSERTION OF FEMORAL ARTERIAL LINE;  Surgeon: Lucas Dorise POUR, MD;  Location: [facility] OR;  Service: Open Heart Surgery;  Laterality: N/A;   IR FLUORO GUIDE CV LINE RIGHT  09/27/2021   IR THORACENTESIS ASP PLEURAL SPACE W/IMG GUIDE  09/27/2021   IR US  GUIDE VASC ACCESS RIGHT  09/27/2021   PLACEMENT OF IMPELLA LEFT VENTRICULAR ASSIST DEVICE N/A 09/12/2021   Procedure: PLACEMENT OF IMPELLA 5.5 LEFT VENTRICULAR ASSIST DEVICE;  Surgeon: Kerrin Elspeth BROCKS, MD;  Location: [facility] OR;  Service: Open Heart Surgery;  Laterality: N/A;   RIGHT HEART CATH N/A 09/08/2021   Procedure: RIGHT HEART CATH;  Surgeon: Rolan Ezra RAMAN, MD;  Location: [facility] INVASIVE CV LAB;  Service: Cardiovascular;  Laterality: N/A;   TEE WITHOUT CARDIOVERSION N/A 09/12/2021   Procedure: TRANSESOPHAGEAL ECHOCARDIOGRAM (TEE);  Surgeon: Kerrin Elspeth BROCKS, MD;  Location: [facility] OR;  Service: Open Heart Surgery;  Laterality: N/A;   TEE WITHOUT CARDIOVERSION N/A 09/17/2021   Procedure: TRANSESOPHAGEAL ECHOCARDIOGRAM (TEE);  Surgeon: Lucas Dorise POUR, MD;  Location: [facility] OR;  Service: Open Heart Surgery;  Laterality: N/A;   Patient Active Problem List   Diagnosis Date Noted   Acute gastric ulcer with hemorrhage 03/29/2024   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   Melena 03/22/2024   ABLA (acute blood loss  anemia) 03/22/2024   Anticoagulated 03/22/2024   AVM (arteriovenous malformation) of small bowel, acquired with hemorrhage 03/22/2024   Upper GI bleed 03/21/2024   Hematoma 02/17/2024   Anemia 02/15/2024   Hypotension 02/15/2024   Sundowning 02/11/2024   Insomnia 02/11/2024   Constipation 02/11/2024   Stage 3b chronic kidney disease (HCC) 02/11/2024   Cardioembolic stroke (HCC) 02/10/2024   Acute left PCA stroke (HCC) 02/05/2024    Abnormal thyroid  blood test 04/05/2023   Abscess of left groin 11/27/2022   VT (ventricular tachycardia) (HCC) 10/27/2022   ICD (implantable cardioverter-defibrillator) in place 10/27/2022   Pressure injury of skin 10/07/2021   LVAD (left ventricular assist device) present (HCC)    Protein-calorie malnutrition, severe 09/10/2021   Elevated TSH    PICC (peripherally inserted central catheter) in place    Cardiogenic shock (HCC)    Syncope 09/07/2021   CHF (congestive heart failure) (HCC) 09/06/2021   CAD (coronary artery disease) 08/22/2021   Cardiomyopathy (HCC) 08/22/2021   Mixed hyperlipidemia 08/22/2021   A-fib (HCC) 08/22/2021   Elevated troponin 08/22/2021   Acute on chronic combined systolic and diastolic CHF (congestive heart failure) (HCC) 08/22/2021   Benign essential hypertension 02/22/2015   Chronic systolic heart failure (HCC) 07/05/2014    ONSET DATE: 02/05/2024; date of referral  02/22/2024  REFERRING DIAG: P36.467 (ICD-10-CM) - Cerebral infarction due to unspecified occlusion or stenosis of left posterior cerebral artery  THERAPY DIAG:  Cognitive communication deficit  Rationale for Evaluation and Treatment Rehabilitation  SUBJECTIVE:   PERTINENT HISTORY and DIAGNOSTIC FINDINGS:  Isaac [name] is a 73 y.o. with history of CAD, HTN, atrial fibrillation, CKD IV, VT and chronic systolic CHFs/p HM-3 LVAD on 09/17/21.  He presented to the [facility] ED on 02/05/24 for focal neuro deficits with R vision field cut and dysmetria. Code Stroke activated. CT brain positive for acute to subacute appearing Left PCA and Left cerebellar infarcts. Hyperdense Left PCA suggests occlusion of that vessel. No hemorrhagic transformation or mass effect. Seen by Neuro and decision made to treat with IV heparin . Echocardiogram shows EF 25 to 30% with LVAD at the apex.  Left atrial is moderately dilate. CT angio distal left P2/P3 occlusion.Repeat CT head 02/06/24 with evolving  left cerebellar, left PCA and right frontal gyrus MCA branch infarct. No hemorrhage.  Neurology feels infarcts 2/2 LVAD and signed off  Pt attended [facility] CIR from until 02/18/2024, received ST services targeting cognitive linguistic abilities and discharged at MOD A for cognitive linguistic skills. Memory/orientation deficits remain the pt's biggest barrier to progress and overall safety.   He was admitted on 03/21/2024 from the office for GI bleed with hemoglobin down to 6.4. Coumadin  was held. He was placed on PPI. GI was consulted and he was transfused multiple PRBCs (total of 7 units this admission). He underwent EGD on 7/2 with friable tissue in the duodenum and 4 AVMS which were treated with APC. INR goal was adjusted to 2-2.5. Unfortunately, he continued to have melena and his hemoglobin continued to drop. He underwent repeat EGD + colonoscopy on 7/9  which showed 1 non-bleeding superficial gastric ulcer and 1 clip was placed. Colonoscopy unremarkable. No further bleeding or drop in hemoglobin. INR 1.8 on day of discharge.   PAIN:  Are you having pain? No   FALLS: Has patient fallen in last 6 months?  No  LIVING ENVIRONMENT: Lives with: lives with their spouse Lives in: House/apartment  PLOF:  Level of assistance: Independent with ADLs, Independent  with IADLs Employment: Retired   PATIENT GOALS   to improve functional independence  SUBJECTIVE STATEMENT: Last week we worked on a calendar. I think my eye is getting sorted. Pt accompanied by: self  OBJECTIVE:  TODAY'S TREATMENT:   Skilled treatment session focused on pt's cognitive communication goals. SLP facilitated session by providing the following interventions:  Facilitated mod complex conversation re: LVAD; patient was able to state vocabulary and describe basic operating procedures. Patient was oriented to current date and described accessing his appointment schedule, noting he was scheduled with a different therapist  than his primary this morning.   Targeted executive functioning via scheduling activity. Patient estimated the task would take him 15 minutes, however he spent the first 10 minutes attempting to review the task. SLP reduced task complexity by creating a list and provided mod-max cues for problem solving, attention to detail, and error awareness. Demonstrated use of checklist to aid task completion. After 25 minutes the task was not complete; 2 events remained to be scheduled. Sent home with patient to complete for homework. Throughout the task patient tended to explain away deficits vs acknowledging attention/visual difficulties impacting him during the task.    PATIENT EDUCATION: Education details: see above Person educated: Patient (pt's wife stayed in lobby with their grandson) Education method: Explanation Education comprehension: verbalized understanding   HOME EXERCISE PROGRAM:   Set alarms on phone [facility], see above   GOALS:  Goals reviewed with patient? Yes  SHORT TERM GOALS: Target date: 10 sessions  With Mod A, pt will utilize external memory aids to answer questions regarding orientation information with 80% accuracy.  Baseline: Goal status: MET  2.  With Mod A, pt will improve word finding by using strategies to produce generative naming list of equipment and vocabulary related to LVAD.  Baseline:  Goal status: MET  3.  Pt will complete addition cognitive assessment to establish current level of ability.  Baseline: Pt discharged from CIR requiring Mod A for basic cognitive function.  Goal status: MET   LONG TERM GOALS: Target date: 05/17/2024  With supervision A, pt will attend to items on his right to improve safety awareness and accuracy of task completion in 5 out of 7 opportunities.  Baseline:  Goal status: IN PROGRESS  2.  With Min A, pt will external and internal memory aides to help short term and prospective memory in 5 out of 7 opportunities.   Baseline:  Goal status: IN PROGRESS  3.  With supervision A, pt will be able to verbally sequence (using appropriate vocabulary) LVAD features, alerts and switching battery sources.  Baseline:  Goal status: IN PROGRESS   ASSESSMENT:  CLINICAL IMPRESSION: Patient is a 73 y.o. male LVAD patient, who was seen today for a cognitive communication treatment d/t left cerebellar, left PCA and right frontal gyrus MCA branch infarct. Pt presents with severe cognitive impairment that is c/b by severe deficits in attention, awareness (pt is aware of difficulty/poor performance within tasks but unaware of overall impairment and impact on safety), profound memory deficits in immediate recall, short term memory, retrieval, visual, auditory and prospective memory decreased awareness of confabulatory responses and severe deficits in executive function as observed during visuospatial tasks, specifically task planning, organization, sequencing with pt losing points d/t slow task completion. Overall adequate expressive and receptive communication.   Current goals continue to be appropriate with pt demonstrating progress in today's session. Pt continues to be eager and puts forth great effort. See the above treatment  note for details.     OBJECTIVE IMPAIRMENTS include attention, memory, awareness, and executive functioning. These impairments are limiting patient from managing medications, managing appointments, managing finances, household responsibilities, and ADLs/IADLs. Factors affecting potential to achieve goals and functional outcome are ability to learn/carryover information, co-morbidities, medical prognosis, previous level of function, and severity of impairments. Patient will benefit from skilled SLP services to address above impairments and improve overall function.  REHAB POTENTIAL: Good  PLAN: SLP FREQUENCY: 1-2x/week  SLP DURATION: 12 weeks  PLANNED INTERVENTIONS: Language facilitation,  Environmental controls, Cueing hierachy, Cognitive reorganization, Internal/external aids, Functional tasks, SLP instruction and feedback, Compensatory strategies, and Patient/family education   Ronal Landry [place], [place], CCC-SLP Speech-Language Pathologist [phone]  [facility] Out[facility] Rehabilitation at [facility] [address] [place], [place], [address] Phone: [phone]   Fax:  [phone]  Patient Details  Name: Xaine Sansom MRN: [id number] Date of Birth: [birth year] Referring Provider:  Pegge Toribio PARAS, PA-C  Encounter Date: 04/10/2024   Ronal FORBES [place], CCC-SLP 04/10/2024, 10:22 AM  [facility] Out[facility] Rehabilitation at [facility] [address] [place], [place], [address] Phone: [phone]   Fax:  [phone]

## 2024-04-11 ENCOUNTER — Ambulatory Visit: Admitting: Physical Therapy

## 2024-04-11 ENCOUNTER — Encounter

## 2024-04-11 NOTE — Therapy (Signed)
 OUTPATIENT PHYSICAL THERAPY TREATMENT   Patient Name: Graylon Amory MRN: [id number] DOB:27-Oct-1950, 73 y.o., male Today's Date: 04/12/2024   PCP: Valora Agent, MD  REFERRING PROVIDER: Urbano Albright, MD    END OF SESSION:   PT End of Session - 04/12/24 0855     Visit Number 4    Number of Visits 24    Date for PT Re-Evaluation 06/27/24    PT Start Time 0854    PT Stop Time 0928    PT Time Calculation (min) 34 min    Equipment Utilized During Treatment Gait belt    Activity Tolerance Patient tolerated treatment well;No increased pain    Behavior During Therapy [facility] for tasks assessed/performed            Past Medical History:  Diagnosis Date   Arrhythmia    atrial fibrillation   CHF (congestive heart failure) (HCC)    Chronic kidney disease    Coronary artery disease    Hyperlipidemia    Hypertension    Myocardial infarct [facility])    Past Surgical History:  Procedure Laterality Date   CARDIAC DEFIBRILLATOR PLACEMENT  feb 2014   COLONOSCOPY N/A 03/29/2024   Procedure: COLONOSCOPY;  Surgeon: Legrand Victory LITTIE DOUGLAS, MD;  Location: [facility] ENDOSCOPY;  Service: Gastroenterology;  Laterality: N/A;   ENTEROSCOPY N/A 03/22/2024   Procedure: ENTEROSCOPY;  Surgeon: Leigh Elspeth SQUIBB, MD;  Location: [facility] ENDOSCOPY;  Service: Gastroenterology;  Laterality: N/A;   ESOPHAGOGASTRODUODENOSCOPY N/A 03/29/2024   Procedure: EGD (ESOPHAGOGASTRODUODENOSCOPY);  Surgeon: Legrand Victory LITTIE DOUGLAS, MD;  Location: [facility] ENDOSCOPY;  Service: Gastroenterology;  Laterality: N/A;   ICD GENERATOR CHANGEOUT N/A 02/09/2024   Procedure: ICD GENERATOR CHANGEOUT;  Surgeon: Waddell Danelle ORN, MD;  Location: [facility] INVASIVE CV LAB;  Service: Cardiovascular;  Laterality: N/A;   INSERTION OF IMPLANTABLE LEFT VENTRICULAR ASSIST DEVICE N/A 09/17/2021   Procedure: INSERTION OF IMPLANTABLE LEFT VENTRICULAR ASSIST DEVICE AND INSERTION OF FEMORAL ARTERIAL LINE;  Surgeon: Lucas Dorise POUR, MD;  Location: [facility] OR;  Service: Open Heart  Surgery;  Laterality: N/A;   IR FLUORO GUIDE CV LINE RIGHT  09/27/2021   IR THORACENTESIS ASP PLEURAL SPACE W/IMG GUIDE  09/27/2021   IR US  GUIDE VASC ACCESS RIGHT  09/27/2021   PLACEMENT OF IMPELLA LEFT VENTRICULAR ASSIST DEVICE N/A 09/12/2021   Procedure: PLACEMENT OF IMPELLA 5.5 LEFT VENTRICULAR ASSIST DEVICE;  Surgeon: Kerrin Elspeth BROCKS, MD;  Location: [facility] OR;  Service: Open Heart Surgery;  Laterality: N/A;   RIGHT HEART CATH N/A 09/08/2021   Procedure: RIGHT HEART CATH;  Surgeon: Rolan Ezra RAMAN, MD;  Location: [facility] INVASIVE CV LAB;  Service: Cardiovascular;  Laterality: N/A;   TEE WITHOUT CARDIOVERSION N/A 09/12/2021   Procedure: TRANSESOPHAGEAL ECHOCARDIOGRAM (TEE);  Surgeon: Kerrin Elspeth BROCKS, MD;  Location: [facility] OR;  Service: Open Heart Surgery;  Laterality: N/A;   TEE WITHOUT CARDIOVERSION N/A 09/17/2021   Procedure: TRANSESOPHAGEAL ECHOCARDIOGRAM (TEE);  Surgeon: Lucas Dorise POUR, MD;  Location: [facility] OR;  Service: Open Heart Surgery;  Laterality: N/A;   Patient Active Problem List   Diagnosis Date Noted   Acute gastric ulcer with hemorrhage 03/29/2024   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   Melena 03/22/2024   ABLA (acute blood loss anemia) 03/22/2024   Anticoagulated 03/22/2024   AVM (arteriovenous malformation) of small bowel, acquired with hemorrhage 03/22/2024   Upper GI bleed 03/21/2024   Hematoma 02/17/2024   Anemia 02/15/2024   Hypotension 02/15/2024   Sundowning 02/11/2024   Insomnia 02/11/2024   Constipation 02/11/2024  Stage 3b chronic kidney disease (HCC) 02/11/2024   Cardioembolic stroke (HCC) 02/10/2024   Acute left PCA stroke (HCC) 02/05/2024   Abnormal thyroid  blood test 04/05/2023   Abscess of left groin 11/27/2022   VT (ventricular tachycardia) (HCC) 10/27/2022   ICD (implantable cardioverter-defibrillator) in place 10/27/2022   Pressure injury of skin 10/07/2021   LVAD (left ventricular assist device) present (HCC)    Protein-calorie malnutrition, severe 09/10/2021    Elevated TSH    PICC (peripherally inserted central catheter) in place    Cardiogenic shock (HCC)    Syncope 09/07/2021   CHF (congestive heart failure) (HCC) 09/06/2021   CAD (coronary artery disease) 08/22/2021   Cardiomyopathy (HCC) 08/22/2021   Mixed hyperlipidemia 08/22/2021   A-fib (HCC) 08/22/2021   Elevated troponin 08/22/2021   Acute on chronic combined systolic and diastolic CHF (congestive heart failure) (HCC) 08/22/2021   Benign essential hypertension 02/22/2015   Chronic systolic heart failure (HCC) 07/05/2014    ONSET DATE: 02/05/24  REFERRING DIAG:  P36.467 (ICD-10-CM) - Cerebral infarction due to unspecified occlusion or stenosis of left posterior cerebral artery   THERAPY DIAG:  Muscle weakness (generalized)  Imbalance  Abnormality of gait  Other lack of coordination  Rationale for Evaluation and Treatment: Rehabilitation  SUBJECTIVE:                                                                                                                                                                                             SUBJECTIVE STATEMENT:   Denies any changes since last session.  PERTINENT HISTORY:  Presented 02/05/2024 s/p fall, right sided weakness and aphasia with blurred vision. CT positive for acute  left PCA left cerebellar infarction. Admitted to  rehab 02/10/2024 for inpatient therapies to consist of PT, ST and OT at least three hours five days a week. Started OPPT here, for 7 visits, then admitted to [facility] for GIB 03/21/24. PMH:  Atrial fibrillation/VT chronic systolic congestive heart failure status post LVAD 09/17/2021.   PAIN:  Are you having pain? No  PRECAUTIONS: ICD/Pacemaker and Other: LVAD  RED FLAGS: None and LVAD   WEIGHT BEARING RESTRICTIONS: LVAD and pace maker.   FALLS: Has patient fallen in last 6 months? No  LIVING ENVIRONMENT: Lives with: lives with their spouse Stevie) Lives in: House/apartment Stairs: Yes: Internal: 9  steps; on left going up Has following equipment at home: Single point cane, Larin Depaoli - 2 wheeled, and does not use  PLOF: Independent with basic ADLs and Independent with household mobility without device, driving  PATIENT GOALS: pt reports the sight in R visual field is limited. Improve strength in  R LE primarily. Improve balance slightly.   OBJECTIVE:  Note: Objective measures were completed at Evaluation unless otherwise noted.    LOWER EXTREMITY ROM:    AROM. WFL  GAIT: Findings: Gait Characteristics: occasional decreased R foot clearance during swing, step through pattern, decreased arm swing- Right, decreased arm swing- Left, and decreased stance time- Right, Distance walked: 60, Assistive device utilized:None, and Level of assistance: Modified independence and SBA  FUNCTIONAL TESTS:  5 times sit to stand: 15.92 seconds 6 minute walk test: 1284 feet (391 meters, Avg speed 1.41m/s), no AD 10 meter walk test: need to assess FGA: need to assess Mini-Best Test: need to assess  PATIENT SURVEYS:  Stroke Impact Scale 72%                                                                                                                              TREATMENT DATE: 04/12/2024    -STS from chair 2x10 with 3kg ball overhead press  -437ft AMB with 5lbAW RLE  -Dynamic marching in // bars with 5# AW x 3 laps D/B  -Standing hip abduction with 5# AW 2 x 10 each LE  -Standing hip extension with 5# AW 2 x 10 each LE  -Step ups with 5# AW x 10 each LE  - easy -Lateral step downs 4 step with single UE support 2 x 10 each LE   PATIENT EDUCATION: Education details: therapy POC, findings from assessment, continuation of prior HEP Person educated: Patient Education method: Explanation Education comprehension: verbalized understanding and needs further education  HOME EXERCISE PROGRAM: Access Code: [email or web address] URL: [email or web address] Date: 04/10/2024 Prepared by: Peggye Linear  Exercises - Tandem Stance with Support  - 1 x daily - 4 x weekly - 3 sets - 4 reps - 20 seconds  hold - Forward and Backward Walking with Eyes Closed and Counter Support  - 1 x daily - 4 x weekly - 3 sets - 5 reps - Side Stepping with Resistance at Thighs and Counter Support  - 1 x daily - 7 x weekly - 2 sets - 10 reps - Forward Step Up with Unilateral Counter Support  - 1 x daily - 7 x weekly - 1-2 sets - 10 reps - Walking  - 3 x daily - 7 x weekly - 1 sets - 6 minutes hold - Sit to Stand with Arms Crossed  - 3 x daily - 7 x weekly - 1 sets - 10 reps  GOALS: Goals reviewed with patient? Yes SHORT TERM GOALS: Target date: 05/16/2024  Patient will be independent in home exercise program to improve strength/mobility for better functional independence with ADLs. Baseline: carried forward prior HEP, will benefit form review and progression Goal status: INITIAL  LONG TERM GOALS: Target date: 06/27/2024  Patient will increase SIS 16 score to equal to or greater than 10 points to demonstrate statistically significant improvement in mobility and quality of life.  Baseline: 72% Goal status:  INITIAL  2.  Patient (> 75 years old) will complete five times sit to stand test in < 15 seconds indicating an increased LE strength and improved balance. Baseline:  15.92 seconds Goal status: INITIAL  3.  Patient will increase MiniBest Test score by at least 5 more points to indicate a reduced risk for falling and demonstrate increased independence with functional mobility and ADLs.  Baseline: need to assess; 04/06/24: 21/28 Goal status: ON GOING   4.  Patient will increase FGA  score to >24/30 as to demonstrate reduced fall risk and improved dynamic gait balance for better safety with community/home ambulation. Baseline: need to assess; 04/06/24: 26/30 Goal status: MET  5.  Patient will increase 6 min walk test to >1463ft to demonstrate decreased fall risk during functional activities as well as  improved access to community mobility.  Baseline: 1284 feet (391 meters, Avg speed 1.79m/s), no AD Goal status: INITIAL   ASSESSMENT:  CLINICAL IMPRESSION:   Patient arrives to treatment session motivated to participate. Session focused on BLE strengthening. Notable R LE instability with single leg activities. Due to increased fall risk and decreased access to community, pt will benefit from skilled PT to allow return to PLOF and increased overall QoL.   OBJECTIVE IMPAIRMENTS: Abnormal gait, cardiopulmonary status limiting activity, decreased activity tolerance, decreased balance, decreased coordination, decreased endurance, decreased mobility, difficulty walking, decreased strength, and improper body mechanics.   ACTIVITY LIMITATIONS: carrying, lifting, squatting, stairs, locomotion level, and caring for others  PARTICIPATION LIMITATIONS: cleaning, laundry, driving, shopping, community activity, occupation, and yard work  PERSONAL FACTORS: Age and 3+ comorbidities: LVAD, Pacemaker, hypotension, Stage 3b CKD  are also affecting patient's functional outcome.   REHAB POTENTIAL: Excellent  CLINICAL DECISION MAKING: Evolving/moderate complexity  EVALUATION COMPLEXITY: Moderate  PLAN:  PT FREQUENCY: 1-2x/week  PT DURATION: 12 weeks  PLANNED INTERVENTIONS: 97164- PT Re-evaluation, 97750- Physical Performance Testing, 97110-Therapeutic exercises, 97530- Therapeutic activity, 97112- Neuromuscular re-education, 97535- Self Care, [id number]- Manual therapy, [phone]- Gait training, [phone]- Canalith repositioning, Patient/Family education, Balance training, Stair training, Taping, Vestibular training, DME instructions, Cryotherapy, Moist heat, and Biofeedback   PLAN FOR NEXT SESSION:  FU on HEP restart; advance interventions  8:56 AM, 04/12/24 Maryanne Finder, PT, DPT Physical Therapist - [place]  [facility]

## 2024-04-12 ENCOUNTER — Ambulatory Visit: Admitting: Speech Pathology

## 2024-04-12 ENCOUNTER — Ambulatory Visit

## 2024-04-12 DIAGNOSIS — R278 Other lack of coordination: Secondary | ICD-10-CM

## 2024-04-12 DIAGNOSIS — R269 Unspecified abnormalities of gait and mobility: Secondary | ICD-10-CM

## 2024-04-12 DIAGNOSIS — M6281 Muscle weakness (generalized): Secondary | ICD-10-CM

## 2024-04-12 DIAGNOSIS — R41841 Cognitive communication deficit: Secondary | ICD-10-CM

## 2024-04-12 DIAGNOSIS — R2689 Other abnormalities of gait and mobility: Secondary | ICD-10-CM

## 2024-04-12 NOTE — Therapy (Unsigned)
 OUTPATIENT SPEECH LANGUAGE PATHOLOGY  COGNITIVE COMMUNICATION TREATMENT NOTE   Patient Name: Isaac [name] MRN: [id number] DOB:Jun 20, 1951, 73 y.o., male Today's Date: 04/12/2024  PCP: Lynwood Null, MD REFERRING PROVIDER: Toribio Pitch, PA   End of Session - 04/12/24 0928     Visit Number 11    Number of Visits 25    Date for SLP Re-Evaluation 05/17/24    Authorization Type [employer] HMO/PPO    Progress Note Due on Visit 20    SLP Start Time 0930    SLP Stop Time  1015    SLP Time Calculation (min) 45 min    Activity Tolerance Patient tolerated treatment well          Past Medical History:  Diagnosis Date   Arrhythmia    atrial fibrillation   CHF (congestive heart failure) (HCC)    Chronic kidney disease    Coronary artery disease    Hyperlipidemia    Hypertension    Myocardial infarct [facility])    Past Surgical History:  Procedure Laterality Date   CARDIAC DEFIBRILLATOR PLACEMENT  feb 2014   COLONOSCOPY N/A 03/29/2024   Procedure: COLONOSCOPY;  Surgeon: Isaac Victory LITTIE DOUGLAS, MD;  Location: [facility] ENDOSCOPY;  Service: Gastroenterology;  Laterality: N/A;   ENTEROSCOPY N/A 03/22/2024   Procedure: ENTEROSCOPY;  Surgeon: Isaac Elspeth SQUIBB, MD;  Location: [facility] ENDOSCOPY;  Service: Gastroenterology;  Laterality: N/A;   ESOPHAGOGASTRODUODENOSCOPY N/A 03/29/2024   Procedure: EGD (ESOPHAGOGASTRODUODENOSCOPY);  Surgeon: Isaac Victory LITTIE DOUGLAS, MD;  Location: [facility] ENDOSCOPY;  Service: Gastroenterology;  Laterality: N/A;   ICD GENERATOR CHANGEOUT N/A 02/09/2024   Procedure: ICD GENERATOR CHANGEOUT;  Surgeon: Isaac Danelle ORN, MD;  Location: [facility] INVASIVE CV LAB;  Service: Cardiovascular;  Laterality: N/A;   INSERTION OF IMPLANTABLE LEFT VENTRICULAR ASSIST DEVICE N/A 09/17/2021   Procedure: INSERTION OF IMPLANTABLE LEFT VENTRICULAR ASSIST DEVICE AND INSERTION OF FEMORAL ARTERIAL LINE;  Surgeon: Isaac Dorise POUR, MD;  Location: [facility] OR;  Service: Open Heart Surgery;  Laterality: N/A;   IR FLUORO  GUIDE CV LINE RIGHT  09/27/2021   IR THORACENTESIS ASP PLEURAL SPACE W/IMG GUIDE  09/27/2021   IR US  GUIDE VASC ACCESS RIGHT  09/27/2021   PLACEMENT OF IMPELLA LEFT VENTRICULAR ASSIST DEVICE N/A 09/12/2021   Procedure: PLACEMENT OF IMPELLA 5.5 LEFT VENTRICULAR ASSIST DEVICE;  Surgeon: Isaac Elspeth BROCKS, MD;  Location: [facility] OR;  Service: Open Heart Surgery;  Laterality: N/A;   RIGHT HEART CATH N/A 09/08/2021   Procedure: RIGHT HEART CATH;  Surgeon: Isaac Ezra RAMAN, MD;  Location: [facility] INVASIVE CV LAB;  Service: Cardiovascular;  Laterality: N/A;   TEE WITHOUT CARDIOVERSION N/A 09/12/2021   Procedure: TRANSESOPHAGEAL ECHOCARDIOGRAM (TEE);  Surgeon: Isaac Elspeth BROCKS, MD;  Location: [facility] OR;  Service: Open Heart Surgery;  Laterality: N/A;   TEE WITHOUT CARDIOVERSION N/A 09/17/2021   Procedure: TRANSESOPHAGEAL ECHOCARDIOGRAM (TEE);  Surgeon: Isaac Dorise POUR, MD;  Location: [facility] OR;  Service: Open Heart Surgery;  Laterality: N/A;   Patient Active Problem List   Diagnosis Date Noted   Acute gastric ulcer with hemorrhage 03/29/2024   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   Melena 03/22/2024   ABLA (acute blood loss anemia) 03/22/2024   Anticoagulated 03/22/2024   AVM (arteriovenous malformation) of small bowel, acquired with hemorrhage 03/22/2024   Upper GI bleed 03/21/2024   Hematoma 02/17/2024   Anemia 02/15/2024   Hypotension 02/15/2024   Sundowning 02/11/2024   Insomnia 02/11/2024   Constipation 02/11/2024   Stage 3b chronic kidney disease (HCC) 02/11/2024  Cardioembolic stroke (HCC) 02/10/2024   Acute left PCA stroke (HCC) 02/05/2024   Abnormal thyroid  blood test 04/05/2023   Abscess of left groin 11/27/2022   VT (ventricular tachycardia) (HCC) 10/27/2022   ICD (implantable cardioverter-defibrillator) in place 10/27/2022   Pressure injury of skin 10/07/2021   LVAD (left ventricular assist device) present (HCC)    Protein-calorie malnutrition, severe 09/10/2021   Elevated TSH    PICC (peripherally  inserted central catheter) in place    Cardiogenic shock (HCC)    Syncope 09/07/2021   CHF (congestive heart failure) (HCC) 09/06/2021   CAD (coronary artery disease) 08/22/2021   Cardiomyopathy (HCC) 08/22/2021   Mixed hyperlipidemia 08/22/2021   A-fib (HCC) 08/22/2021   Elevated troponin 08/22/2021   Acute on chronic combined systolic and diastolic CHF (congestive heart failure) (HCC) 08/22/2021   Benign essential hypertension 02/22/2015   Chronic systolic heart failure (HCC) 07/05/2014    ONSET DATE: 02/05/2024; date of referral  02/22/2024  REFERRING DIAG: P36.467 (ICD-10-CM) - Cerebral infarction due to unspecified occlusion or stenosis of left posterior cerebral artery  THERAPY DIAG:  Cognitive communication deficit  Rationale for Evaluation and Treatment Rehabilitation  SUBJECTIVE:   PERTINENT HISTORY and DIAGNOSTIC FINDINGS:  Mr. [name] is a 73 y.o. with history of CAD, HTN, atrial fibrillation, CKD IV, VT and chronic systolic CHFs/p HM-3 LVAD on 09/17/21.  He presented to the [facility] ED on 02/05/24 for focal neuro deficits with R vision field cut and dysmetria. Code Stroke activated. CT brain positive for acute to subacute appearing Left PCA and Left cerebellar infarcts. Hyperdense Left PCA suggests occlusion of that vessel. No hemorrhagic transformation or mass effect. Seen by Neuro and decision made to treat with IV heparin . Echocardiogram shows EF 25 to 30% with LVAD at the apex.  Left atrial is moderately dilate. CT angio distal left P2/P3 occlusion.Repeat CT head 02/06/24 with evolving left cerebellar, left PCA and right frontal gyrus MCA branch infarct. No hemorrhage.  Neurology feels infarcts 2/2 LVAD and signed off  Pt attended [facility] CIR from until 02/18/2024, received ST services targeting cognitive linguistic abilities and discharged at MOD A for cognitive linguistic skills. Memory/orientation deficits remain the pt's biggest barrier to progress and  overall safety.   He was admitted on 03/21/2024 from the office for GI bleed with hemoglobin down to 6.4. Coumadin  was held. He was placed on PPI. GI was consulted and he was transfused multiple PRBCs (total of 7 units this admission). He underwent EGD on 7/2 with friable tissue in the duodenum and 4 AVMS which were treated with APC. INR goal was adjusted to 2-2.5. Unfortunately, he continued to have melena and his hemoglobin continued to drop. He underwent repeat EGD + colonoscopy on 7/9  which showed 1 non-bleeding superficial gastric ulcer and 1 clip was placed. Colonoscopy unremarkable. No further bleeding or drop in hemoglobin. INR 1.8 on day of discharge.   PAIN:  Are you having pain? No   FALLS: Has patient fallen in last 6 months?  No  LIVING ENVIRONMENT: Lives with: lives with their spouse Lives in: House/apartment  PLOF:  Level of assistance: Independent with ADLs, Independent with IADLs Employment: Retired   PATIENT GOALS   to improve functional independence  SUBJECTIVE STATEMENT: Pt's wife reports every morning he wakes up with new faculties she stayed in lobby with their grandson Pt accompanied by: self  OBJECTIVE:  TODAY'S TREATMENT:   Skilled treatment session focused on pt's cognitive communication goals. SLP facilitated session by  providing the following interventions:  Pt continues to report feeling better - he was reviewing new August therapy session I don't like this change in times referencing earlier therapy times that his wife requested - while looking at the calendar, pt mentioned having a therapy appt at 6:50am - with SLP helping pt read calendar he has a remote check-in pt voiced understanding  Pt mentions my wife said you got to help me remember some of this medicine this writer recommended setting an alarm to remind him to take his medicines to which he replies I need to figure out what medicines I am taking first because it is too many and then he  stated well it (alarm) goes off all the time as it is With prompts, pt pulled up clock appt to look at current alarms. He misread label that said Physical Therapy Exercises as being physical therapy everyday at 10am instead of a reminder to complete his PT HEP. He also didn't want to re-label physical therapy exercise to read homework plan because my wife will make me do them if she sees it. He further comments that phone still gives me a fit but politely declined having SLP help him problem solve basic apps.   PATIENT EDUCATION: Education details: see above Person educated: Patient (pt's wife stayed in lobby with their grandson) Education method: Explanation Education comprehension: verbalized understanding   HOME EXERCISE PROGRAM:   Set alarms on phone [facility], see above   GOALS:  Goals reviewed with patient? Yes  SHORT TERM GOALS: Target date: 10 sessions  With Mod A, pt will utilize external memory aids to answer questions regarding orientation information with 80% accuracy.  Baseline: Goal status: INITIAL  2.  With Mod A, pt will improve word finding by using strategies to produce generative naming list of equipment and vocabulary related to LVAD.  Baseline:  Goal status: INITIAL  3.  Pt will complete addition cognitive assessment to establish current level of ability.  Baseline: Pt discharged from CIR requiring Mod A for basic cognitive function.  Goal status: INITIAL   LONG TERM GOALS: Target date: 05/17/2024  With supervision A, pt will attend to items on his right to improve safety awareness and accuracy of task completion in 5 out of 7 opportunities.  Baseline:  Goal status: INITIAL  2.  With Min A, pt will external and internal memory aides to help short term and prospective memory in 5 out of 7 opportunities.  Baseline:  Goal status: INITIAL  3.  With supervision A, pt will be able to verbally sequence (using appropriate vocabulary) LVAD  features, alerts and switching battery sources.  Baseline:  Goal status: INITIAL   ASSESSMENT:  CLINICAL IMPRESSION: Patient is a 73 y.o. male LVAD patient, who was seen today for a cognitive communication treatment d/t left cerebellar, left PCA and right frontal gyrus MCA branch infarct. Pt presents with severe cognitive impairment that is c/b by severe deficits in attention, awareness (pt is aware of difficulty/poor performance within tasks but unaware of overall impairment and impact on safety), profound memory deficits in immediate recall, short term memory, retrieval, visual, auditory and prospective memory decreased awareness of confabulatory responses and severe deficits in executive function as observed during visuospatial tasks, specifically task planning, organization, sequencing with pt losing points d/t slow task completion. Overall adequate expressive and receptive communication.   Current goals continue to be appropriate with pt demonstrating progress in today's session. Pt continues to be eager and puts forth great  effort. See the above treatment note for details.     OBJECTIVE IMPAIRMENTS include attention, memory, awareness, and executive functioning. These impairments are limiting patient from managing medications, managing appointments, managing finances, household responsibilities, and ADLs/IADLs. Factors affecting potential to achieve goals and functional outcome are ability to learn/carryover information, co-morbidities, medical prognosis, previous level of function, and severity of impairments. Patient will benefit from skilled SLP services to address above impairments and improve overall function.  REHAB POTENTIAL: Good  PLAN: SLP FREQUENCY: 1-2x/week  SLP DURATION: 12 weeks  PLANNED INTERVENTIONS: Language facilitation, Environmental controls, Cueing hierachy, Cognitive reorganization, Internal/external aids, Functional tasks, SLP instruction and feedback, Compensatory  strategies, and Patient/family education   Lenora Gomes B. Rubbie, M.S., CCC-SLP, Tree surgeon Certified Brain Injury Specialist [facility]  [facility] Services Office [phone] Ascom [phone] Fax [phone]

## 2024-04-13 ENCOUNTER — Encounter

## 2024-04-13 ENCOUNTER — Ambulatory Visit

## 2024-04-13 ENCOUNTER — Ambulatory Visit (HOSPITAL_COMMUNITY): Payer: Self-pay | Admitting: Pharmacist

## 2024-04-13 ENCOUNTER — Encounter: Admitting: Speech Pathology

## 2024-04-13 LAB — POCT INR: INR: 2.3 (ref 2.0–3.0)

## 2024-04-17 ENCOUNTER — Encounter: Attending: Physical Medicine & Rehabilitation | Admitting: Physical Medicine & Rehabilitation

## 2024-04-18 ENCOUNTER — Ambulatory Visit: Admitting: Physical Therapy

## 2024-04-18 ENCOUNTER — Ambulatory Visit: Admitting: Speech Pathology

## 2024-04-18 ENCOUNTER — Ambulatory Visit: Payer: Self-pay | Admitting: Cardiovascular Disease

## 2024-04-18 ENCOUNTER — Encounter: Admitting: Occupational Therapy

## 2024-04-18 DIAGNOSIS — R278 Other lack of coordination: Secondary | ICD-10-CM

## 2024-04-18 DIAGNOSIS — R2689 Other abnormalities of gait and mobility: Secondary | ICD-10-CM

## 2024-04-18 DIAGNOSIS — R41841 Cognitive communication deficit: Secondary | ICD-10-CM

## 2024-04-18 DIAGNOSIS — M6281 Muscle weakness (generalized): Secondary | ICD-10-CM

## 2024-04-18 DIAGNOSIS — R269 Unspecified abnormalities of gait and mobility: Secondary | ICD-10-CM

## 2024-04-18 NOTE — Therapy (Signed)
 OUTPATIENT PHYSICAL THERAPY TREATMENT   Patient Name: Isaac [name] MRN: [id number] DOB:1951/03/30, 73 y.o., male Today's Date: 04/18/2024   PCP: Valora Agent, MD  REFERRING PROVIDER: Urbano Albright, MD    END OF SESSION:   PT End of Session - 04/18/24 0805     Visit Number 5    Number of Visits 24    Date for PT Re-Evaluation 06/27/24    PT Start Time 0806    PT Stop Time 0845    PT Time Calculation (min) 39 min    Equipment Utilized During Treatment Gait belt    Activity Tolerance Patient tolerated treatment well;No increased pain    Behavior During Therapy [facility] for tasks assessed/performed            Past Medical History:  Diagnosis Date   Arrhythmia    atrial fibrillation   CHF (congestive heart failure) (HCC)    Chronic kidney disease    Coronary artery disease    Hyperlipidemia    Hypertension    Myocardial infarct [facility])    Past Surgical History:  Procedure Laterality Date   CARDIAC DEFIBRILLATOR PLACEMENT  feb 2014   COLONOSCOPY N/A 03/29/2024   Procedure: COLONOSCOPY;  Surgeon: Legrand Victory LITTIE DOUGLAS, MD;  Location: [facility] ENDOSCOPY;  Service: Gastroenterology;  Laterality: N/A;   ENTEROSCOPY N/A 03/22/2024   Procedure: ENTEROSCOPY;  Surgeon: Leigh Elspeth SQUIBB, MD;  Location: [facility] ENDOSCOPY;  Service: Gastroenterology;  Laterality: N/A;   ESOPHAGOGASTRODUODENOSCOPY N/A 03/29/2024   Procedure: EGD (ESOPHAGOGASTRODUODENOSCOPY);  Surgeon: Legrand Victory LITTIE DOUGLAS, MD;  Location: [facility] ENDOSCOPY;  Service: Gastroenterology;  Laterality: N/A;   ICD GENERATOR CHANGEOUT N/A 02/09/2024   Procedure: ICD GENERATOR CHANGEOUT;  Surgeon: Waddell Danelle ORN, MD;  Location: [facility] INVASIVE CV LAB;  Service: Cardiovascular;  Laterality: N/A;   INSERTION OF IMPLANTABLE LEFT VENTRICULAR ASSIST DEVICE N/A 09/17/2021   Procedure: INSERTION OF IMPLANTABLE LEFT VENTRICULAR ASSIST DEVICE AND INSERTION OF FEMORAL ARTERIAL LINE;  Surgeon: Lucas Dorise POUR, MD;  Location: [facility] OR;  Service: Open Heart  Surgery;  Laterality: N/A;   IR FLUORO GUIDE CV LINE RIGHT  09/27/2021   IR THORACENTESIS ASP PLEURAL SPACE W/IMG GUIDE  09/27/2021   IR US  GUIDE VASC ACCESS RIGHT  09/27/2021   PLACEMENT OF IMPELLA LEFT VENTRICULAR ASSIST DEVICE N/A 09/12/2021   Procedure: PLACEMENT OF IMPELLA 5.5 LEFT VENTRICULAR ASSIST DEVICE;  Surgeon: Kerrin Elspeth BROCKS, MD;  Location: [facility] OR;  Service: Open Heart Surgery;  Laterality: N/A;   RIGHT HEART CATH N/A 09/08/2021   Procedure: RIGHT HEART CATH;  Surgeon: Rolan Ezra RAMAN, MD;  Location: [facility] INVASIVE CV LAB;  Service: Cardiovascular;  Laterality: N/A;   TEE WITHOUT CARDIOVERSION N/A 09/12/2021   Procedure: TRANSESOPHAGEAL ECHOCARDIOGRAM (TEE);  Surgeon: Kerrin Elspeth BROCKS, MD;  Location: [facility] OR;  Service: Open Heart Surgery;  Laterality: N/A;   TEE WITHOUT CARDIOVERSION N/A 09/17/2021   Procedure: TRANSESOPHAGEAL ECHOCARDIOGRAM (TEE);  Surgeon: Lucas Dorise POUR, MD;  Location: [facility] OR;  Service: Open Heart Surgery;  Laterality: N/A;   Patient Active Problem List   Diagnosis Date Noted   Acute gastric ulcer with hemorrhage 03/29/2024   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   Melena 03/22/2024   ABLA (acute blood loss anemia) 03/22/2024   Anticoagulated 03/22/2024   AVM (arteriovenous malformation) of small bowel, acquired with hemorrhage 03/22/2024   Upper GI bleed 03/21/2024   Hematoma 02/17/2024   Anemia 02/15/2024   Hypotension 02/15/2024   Sundowning 02/11/2024   Insomnia 02/11/2024   Constipation 02/11/2024  Stage 3b chronic kidney disease (HCC) 02/11/2024   Cardioembolic stroke (HCC) 02/10/2024   Acute left PCA stroke (HCC) 02/05/2024   Abnormal thyroid  blood test 04/05/2023   Abscess of left groin 11/27/2022   VT (ventricular tachycardia) (HCC) 10/27/2022   ICD (implantable cardioverter-defibrillator) in place 10/27/2022   Pressure injury of skin 10/07/2021   LVAD (left ventricular assist device) present (HCC)    Protein-calorie malnutrition, severe 09/10/2021    Elevated TSH    PICC (peripherally inserted central catheter) in place    Cardiogenic shock (HCC)    Syncope 09/07/2021   CHF (congestive heart failure) (HCC) 09/06/2021   CAD (coronary artery disease) 08/22/2021   Cardiomyopathy (HCC) 08/22/2021   Mixed hyperlipidemia 08/22/2021   A-fib (HCC) 08/22/2021   Elevated troponin 08/22/2021   Acute on chronic combined systolic and diastolic CHF (congestive heart failure) (HCC) 08/22/2021   Benign essential hypertension 02/22/2015   Chronic systolic heart failure (HCC) 07/05/2014    ONSET DATE: 02/05/24  REFERRING DIAG:  P36.467 (ICD-10-CM) - Cerebral infarction due to unspecified occlusion or stenosis of left posterior cerebral artery   THERAPY DIAG:  Muscle weakness (generalized)  Imbalance  Abnormality of gait  Other lack of coordination  Rationale for Evaluation and Treatment: Rehabilitation  SUBJECTIVE:                                                                                                                                                                                             SUBJECTIVE STATEMENT:    Pt reports no pain at start pt PT treatment. Reports that he had a good weekend, but staying inside due to the excessive heat.     PERTINENT HISTORY:  Presented 02/05/2024 s/p fall, right sided weakness and aphasia with blurred vision. CT positive for acute  left PCA left cerebellar infarction. Admitted to  rehab 02/10/2024 for inpatient therapies to consist of PT, ST and OT at least three hours five days a week. Started OPPT here, for 7 visits, then admitted to [facility] for GIB 03/21/24. PMH:  Atrial fibrillation/VT chronic systolic congestive heart failure status post LVAD 09/17/2021.   PAIN:  Are you having pain? No  PRECAUTIONS: ICD/Pacemaker and Other: LVAD  RED FLAGS: None and LVAD   WEIGHT BEARING RESTRICTIONS: LVAD and pace maker.   FALLS: Has patient fallen in last 6 months? No  LIVING ENVIRONMENT: Lives  with: lives with their spouse Stevie) Lives in: House/apartment Stairs: Yes: Internal: 9 steps; on left going up Has following equipment at home: Single point cane, Walker - 2 wheeled, and does not use  PLOF: Independent with basic ADLs and Independent  with household mobility without device, driving  PATIENT GOALS: pt reports the sight in R visual field is limited. Improve strength in R LE primarily. Improve balance slightly.   OBJECTIVE:  Note: Objective measures were completed at Evaluation unless otherwise noted.    LOWER EXTREMITY ROM:    AROM. WFL  GAIT: Findings: Gait Characteristics: occasional decreased R foot clearance during swing, step through pattern, decreased arm swing- Right, decreased arm swing- Left, and decreased stance time- Right, Distance walked: 60, Assistive device utilized:None, and Level of assistance: Modified independence and SBA  FUNCTIONAL TESTS:  5 times sit to stand: 15.92 seconds 6 minute walk test: 1284 feet (391 meters, Avg speed 1.19m/s), no AD 10 meter walk test: need to assess FGA: need to assess Mini-Best Test: need to assess  PATIENT SURVEYS:  Stroke Impact Scale 72%                                                                                                                              TREATMENT DATE: 04/18/2024    TA:   -reciprocal step over 5inch hurdle, 5# AW, 2x 12  increased UE support on rail with fatigue.   -BLE step over 5inch hurdle with 5 #AW, 2x 12 intermittent UE support  -662ft AMB with 5lbAW BLE  - side stepping R and L 51ft, 5#AW 67ft x 5 bil  - forward/reverse gait with 5# AW `70ft x 5  - sit<>stand with overhead press 6KG x 10   Pt rates interventions as medium to hard on this day. Provided multiple therapeutic rest breaks due to mild SOB and BLE fatigue.  Unless otherwise noted, PT provided CGA-supervision assist for safety.   PATIENT EDUCATION: Education details: therapy POC, findings from assessment,  continuation of prior HEP Person educated: Patient Education method: Explanation Education comprehension: verbalized understanding and needs further education  HOME EXERCISE PROGRAM: Access Code: [email or web address] URL: [email or web address] Date: 04/10/2024 Prepared by: Peggye Linear  Exercises - Tandem Stance with Support  - 1 x daily - 4 x weekly - 3 sets - 4 reps - 20 seconds  hold - Forward and Backward Walking with Eyes Closed and Counter Support  - 1 x daily - 4 x weekly - 3 sets - 5 reps - Side Stepping with Resistance at Thighs and Counter Support  - 1 x daily - 7 x weekly - 2 sets - 10 reps - Forward Step Up with Unilateral Counter Support  - 1 x daily - 7 x weekly - 1-2 sets - 10 reps - Walking  - 3 x daily - 7 x weekly - 1 sets - 6 minutes hold - Sit to Stand with Arms Crossed  - 3 x daily - 7 x weekly - 1 sets - 10 reps  GOALS: Goals reviewed with patient? Yes SHORT TERM GOALS: Target date: 05/16/2024  Patient will be independent in home exercise program to improve strength/mobility for better functional independence with ADLs. Baseline: carried  forward prior HEP, will benefit form review and progression Goal status: INITIAL  LONG TERM GOALS: Target date: 06/27/2024  Patient will increase SIS 16 score to equal to or greater than 10 points to demonstrate statistically significant improvement in mobility and quality of life.  Baseline: 72% Goal status: INITIAL  2.  Patient (> 32 years old) will complete five times sit to stand test in < 15 seconds indicating an increased LE strength and improved balance. Baseline:  15.92 seconds Goal status: INITIAL  3.  Patient will increase MiniBest Test score by at least 5 more points to indicate a reduced risk for falling and demonstrate increased independence with functional mobility and ADLs.  Baseline: need to assess; 04/06/24: 21/28 Goal status: ON GOING   4.  Patient will increase FGA  score to >24/30 as to demonstrate  reduced fall risk and improved dynamic gait balance for better safety with community/home ambulation. Baseline: need to assess; 04/06/24: 26/30 Goal status: MET  5.  Patient will increase 6 min walk test to >1470ft to demonstrate decreased fall risk during functional activities as well as improved access to community mobility.  Baseline: 1284 feet (391 meters, Avg speed 1.68m/s), no AD Goal status: INITIAL   ASSESSMENT:  CLINICAL IMPRESSION:   Patient arrives to treatment session motivated to participate. PT treatment focused on dynamic weighted mobility to improve safety and independence with Daily tasks. Pt reports difficulty of tasks as medium to hard with increased repetitions and resistance.  Due to increased fall risk and decreased access to community, pt will benefit from skilled PT to allow return to PLOF and increased overall QoL.   OBJECTIVE IMPAIRMENTS: Abnormal gait, cardiopulmonary status limiting activity, decreased activity tolerance, decreased balance, decreased coordination, decreased endurance, decreased mobility, difficulty walking, decreased strength, and improper body mechanics.   ACTIVITY LIMITATIONS: carrying, lifting, squatting, stairs, locomotion level, and caring for others  PARTICIPATION LIMITATIONS: cleaning, laundry, driving, shopping, community activity, occupation, and yard work  PERSONAL FACTORS: Age and 3+ comorbidities: LVAD, Pacemaker, hypotension, Stage 3b CKD  are also affecting patient's functional outcome.   REHAB POTENTIAL: Excellent  CLINICAL DECISION MAKING: Evolving/moderate complexity  EVALUATION COMPLEXITY: Moderate  PLAN:  PT FREQUENCY: 1-2x/week  PT DURATION: 12 weeks  PLANNED INTERVENTIONS: 97164- PT Re-evaluation, 97750- Physical Performance Testing, 97110-Therapeutic exercises, 97530- Therapeutic activity, 97112- Neuromuscular re-education, 97535- Self Care, [id number]- Manual therapy, [phone]- Gait training, [phone]- Canalith repositioning,  Patient/Family education, Balance training, Stair training, Taping, Vestibular training, DME instructions, Cryotherapy, Moist heat, and Biofeedback   PLAN FOR NEXT SESSION:  Continue to assess HEP  Dynamic balance and mobility    Massie Dollar PT, DPT  Physical Therapist - [facility]  [facility]  8:51 AM 04/18/24

## 2024-04-18 NOTE — Therapy (Signed)
 OUTPATIENT SPEECH LANGUAGE PATHOLOGY  COGNITIVE COMMUNICATION TREATMENT NOTE   Patient Name: Isaac [name] MRN: [id number] DOB:05/24/1951, 73 y.o., male Today's Date: 04/18/2024  PCP: Lynwood Null, MD REFERRING PROVIDER: Toribio Pitch, PA   End of Session - 04/18/24 0915     Visit Number 12    Number of Visits 25    Date for SLP Re-Evaluation 05/17/24    Authorization Type [employer] HMO/PPO    Progress Note Due on Visit 20    SLP Start Time 0845    SLP Stop Time  0930    SLP Time Calculation (min) 45 min    Activity Tolerance Patient tolerated treatment well          Past Medical History:  Diagnosis Date   Arrhythmia    atrial fibrillation   CHF (congestive heart failure) (HCC)    Chronic kidney disease    Coronary artery disease    Hyperlipidemia    Hypertension    Myocardial infarct [facility])    Past Surgical History:  Procedure Laterality Date   CARDIAC DEFIBRILLATOR PLACEMENT  feb 2014   COLONOSCOPY N/A 03/29/2024   Procedure: COLONOSCOPY;  Surgeon: Legrand Victory LITTIE DOUGLAS, MD;  Location: [facility] ENDOSCOPY;  Service: Gastroenterology;  Laterality: N/A;   ENTEROSCOPY N/A 03/22/2024   Procedure: ENTEROSCOPY;  Surgeon: Leigh Elspeth SQUIBB, MD;  Location: [facility] ENDOSCOPY;  Service: Gastroenterology;  Laterality: N/A;   ESOPHAGOGASTRODUODENOSCOPY N/A 03/29/2024   Procedure: EGD (ESOPHAGOGASTRODUODENOSCOPY);  Surgeon: Legrand Victory LITTIE DOUGLAS, MD;  Location: [facility] ENDOSCOPY;  Service: Gastroenterology;  Laterality: N/A;   ICD GENERATOR CHANGEOUT N/A 02/09/2024   Procedure: ICD GENERATOR CHANGEOUT;  Surgeon: Waddell Danelle ORN, MD;  Location: [facility] INVASIVE CV LAB;  Service: Cardiovascular;  Laterality: N/A;   INSERTION OF IMPLANTABLE LEFT VENTRICULAR ASSIST DEVICE N/A 09/17/2021   Procedure: INSERTION OF IMPLANTABLE LEFT VENTRICULAR ASSIST DEVICE AND INSERTION OF FEMORAL ARTERIAL LINE;  Surgeon: Lucas Dorise POUR, MD;  Location: [facility] OR;  Service: Open Heart Surgery;  Laterality: N/A;   IR FLUORO  GUIDE CV LINE RIGHT  09/27/2021   IR THORACENTESIS ASP PLEURAL SPACE W/IMG GUIDE  09/27/2021   IR US  GUIDE VASC ACCESS RIGHT  09/27/2021   PLACEMENT OF IMPELLA LEFT VENTRICULAR ASSIST DEVICE N/A 09/12/2021   Procedure: PLACEMENT OF IMPELLA 5.5 LEFT VENTRICULAR ASSIST DEVICE;  Surgeon: Kerrin Elspeth BROCKS, MD;  Location: [facility] OR;  Service: Open Heart Surgery;  Laterality: N/A;   RIGHT HEART CATH N/A 09/08/2021   Procedure: RIGHT HEART CATH;  Surgeon: Rolan Ezra RAMAN, MD;  Location: [facility] INVASIVE CV LAB;  Service: Cardiovascular;  Laterality: N/A;   TEE WITHOUT CARDIOVERSION N/A 09/12/2021   Procedure: TRANSESOPHAGEAL ECHOCARDIOGRAM (TEE);  Surgeon: Kerrin Elspeth BROCKS, MD;  Location: [facility] OR;  Service: Open Heart Surgery;  Laterality: N/A;   TEE WITHOUT CARDIOVERSION N/A 09/17/2021   Procedure: TRANSESOPHAGEAL ECHOCARDIOGRAM (TEE);  Surgeon: Lucas Dorise POUR, MD;  Location: [facility] OR;  Service: Open Heart Surgery;  Laterality: N/A;   Patient Active Problem List   Diagnosis Date Noted   Acute gastric ulcer with hemorrhage 03/29/2024   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   Melena 03/22/2024   ABLA (acute blood loss anemia) 03/22/2024   Anticoagulated 03/22/2024   AVM (arteriovenous malformation) of small bowel, acquired with hemorrhage 03/22/2024   Upper GI bleed 03/21/2024   Hematoma 02/17/2024   Anemia 02/15/2024   Hypotension 02/15/2024   Sundowning 02/11/2024   Insomnia 02/11/2024   Constipation 02/11/2024   Stage 3b chronic kidney disease (HCC) 02/11/2024  Cardioembolic stroke (HCC) 02/10/2024   Acute left PCA stroke (HCC) 02/05/2024   Abnormal thyroid  blood test 04/05/2023   Abscess of left groin 11/27/2022   VT (ventricular tachycardia) (HCC) 10/27/2022   ICD (implantable cardioverter-defibrillator) in place 10/27/2022   Pressure injury of skin 10/07/2021   LVAD (left ventricular assist device) present (HCC)    Protein-calorie malnutrition, severe 09/10/2021   Elevated TSH    PICC (peripherally  inserted central catheter) in place    Cardiogenic shock (HCC)    Syncope 09/07/2021   CHF (congestive heart failure) (HCC) 09/06/2021   CAD (coronary artery disease) 08/22/2021   Cardiomyopathy (HCC) 08/22/2021   Mixed hyperlipidemia 08/22/2021   A-fib (HCC) 08/22/2021   Elevated troponin 08/22/2021   Acute on chronic combined systolic and diastolic CHF (congestive heart failure) (HCC) 08/22/2021   Benign essential hypertension 02/22/2015   Chronic systolic heart failure (HCC) 07/05/2014    ONSET DATE: 02/05/2024; date of referral  02/22/2024  REFERRING DIAG: P36.467 (ICD-10-CM) - Cerebral infarction due to unspecified occlusion or stenosis of left posterior cerebral artery  THERAPY DIAG:  Cognitive communication deficit  Rationale for Evaluation and Treatment Rehabilitation  SUBJECTIVE:   PERTINENT HISTORY and DIAGNOSTIC FINDINGS:  [name] is a 73 y.o. with history of CAD, HTN, atrial fibrillation, CKD IV, VT and chronic systolic CHFs/p HM-3 LVAD on 09/17/21.  He presented to the [facility] ED on 02/05/24 for focal neuro deficits with R vision field cut and dysmetria. Code Stroke activated. CT brain positive for acute to subacute appearing Left PCA and Left cerebellar infarcts. Hyperdense Left PCA suggests occlusion of that vessel. No hemorrhagic transformation or mass effect. Seen by Neuro and decision made to treat with IV heparin . Echocardiogram shows EF 25 to 30% with LVAD at the apex.  Left atrial is moderately dilate. CT angio distal left P2/P3 occlusion.Repeat CT head 02/06/24 with evolving left cerebellar, left PCA and right frontal gyrus MCA branch infarct. No hemorrhage.  Neurology feels infarcts 2/2 LVAD and signed off  Pt attended [facility] CIR from until 02/18/2024, received ST services targeting cognitive linguistic abilities and discharged at MOD [facility] for cognitive linguistic skills. Memory/orientation deficits remain the pt's biggest barrier to progress and  overall safety.   He was admitted on 03/21/2024 from the office for GI bleed with hemoglobin down to 6.4. Coumadin  was held. He was placed on PPI. GI was consulted and he was transfused multiple PRBCs (total of 7 units this admission). He underwent EGD on 7/2 with friable tissue in the duodenum and 4 AVMS which were treated with APC. INR goal was adjusted to 2-2.5. Unfortunately, he continued to have melena and his hemoglobin continued to drop. He underwent repeat EGD + colonoscopy on 7/9  which showed 1 non-bleeding superficial gastric ulcer and 1 clip was placed. Colonoscopy unremarkable. No further bleeding or drop in hemoglobin. INR 1.8 on day of discharge.   PAIN:  Are you having pain? No   FALLS: Has patient fallen in last 6 months?  No  LIVING ENVIRONMENT: Lives with: lives with their spouse Lives in: House/apartment  PLOF:  Level of assistance: Independent with ADLs, Independent with IADLs Employment: Retired   PATIENT GOALS   to improve functional independence  SUBJECTIVE STATEMENT: Pt arrived to session describing issues related to being able to see Pt accompanied by: self  OBJECTIVE:  TODAY'S TREATMENT:   Skilled treatment session focused on pt's cognitive communication goals. SLP facilitated session by providing the following interventions:  Pt  arrived to session and was provided with choice to 2 activities - 1.) go to the gift store and find the price of two specific items or 2.) perform activities within speech office - pt never voiced a choice but commented If I could see better then I could whiz thru this stuff. On the right side I have a dead spot but if I continue to turn (head) I can see it but it is like a block. Education provided on potentially scheduling an evaluation with neuro-ophthalmologist. Information written down for his wife as well.   As a result of pt's comments/desire, SLP further facilitated session by providing semi-complex visual scanning  tasks. When scanning line of letters, pt was independent when reading each letter in the line, using his finger to follow along. When asked to scan each line and read the underlined letter, pt required Min A specifically to complete each line (right side of page) and effectively locate the the next line (left of page). When asked to circle double numbers on a line and then count how many were present he was Mod I for 98% accuracy.   PATIENT EDUCATION: Education details: see above Person educated: Patient (pt's wife stayed in lobby with their grandson) Education method: Explanation Education comprehension: verbalized understanding   HOME EXERCISE PROGRAM:   Set alarms on phone [facility], see above   GOALS:  Goals reviewed with patient? Yes  SHORT TERM GOALS: Target date: 10 sessions  With Mod A, pt will utilize external memory aids to answer questions regarding orientation information with 80% accuracy.  Baseline: Goal status: INITIAL  2.  With Mod A, pt will improve word finding by using strategies to produce generative naming list of equipment and vocabulary related to LVAD.  Baseline:  Goal status: INITIAL  3.  Pt will complete addition cognitive assessment to establish current level of ability.  Baseline: Pt discharged from CIR requiring Mod A for basic cognitive function.  Goal status: INITIAL   LONG TERM GOALS: Target date: 05/17/2024  With supervision A, pt will attend to items on his right to improve safety awareness and accuracy of task completion in 5 out of 7 opportunities.  Baseline:  Goal status: INITIAL  2.  With Min A, pt will external and internal memory aides to help short term and prospective memory in 5 out of 7 opportunities.  Baseline:  Goal status: INITIAL  3.  With supervision A, pt will be able to verbally sequence (using appropriate vocabulary) LVAD features, alerts and switching battery sources.  Baseline:  Goal status:  INITIAL   ASSESSMENT:  CLINICAL IMPRESSION: Patient is a 73 y.o. male LVAD patient, who was seen today for a cognitive communication treatment d/t left cerebellar, left PCA and right frontal gyrus MCA branch infarct. Pt presents with severe cognitive impairment that is c/b by severe deficits in attention, awareness (pt is aware of difficulty/poor performance within tasks but unaware of overall impairment and impact on safety), profound memory deficits in immediate recall, short term memory, retrieval, visual, auditory and prospective memory decreased awareness of confabulatory responses and severe deficits in executive function as observed during visuospatial tasks, specifically task planning, organization, sequencing with pt losing points d/t slow task completion. Overall adequate expressive and receptive communication.   Current goals continue to be appropriate with pt demonstrating progress in today's session. Pt continues to be eager and puts forth great effort. See the above treatment note for details.     OBJECTIVE IMPAIRMENTS include attention, memory,  awareness, and executive functioning. These impairments are limiting patient from managing medications, managing appointments, managing finances, household responsibilities, and ADLs/IADLs. Factors affecting potential to achieve goals and functional outcome are ability to learn/carryover information, co-morbidities, medical prognosis, previous level of function, and severity of impairments. Patient will benefit from skilled SLP services to address above impairments and improve overall function.  REHAB POTENTIAL: Good  PLAN: SLP FREQUENCY: 1-2x/week  SLP DURATION: 12 weeks  PLANNED INTERVENTIONS: Language facilitation, Environmental controls, Cueing hierachy, Cognitive reorganization, Internal/external aids, Functional tasks, SLP instruction and feedback, Compensatory strategies, and Patient/family education   Kayode Petion B. Rubbie, M.S.,  CCC-SLP, Tree surgeon Certified Brain Injury Specialist [facility]  [facility] Rehabilitation Services Office [phone] Ascom [phone] Fax [phone]

## 2024-04-20 ENCOUNTER — Ambulatory Visit: Admitting: Speech Pathology

## 2024-04-20 ENCOUNTER — Encounter: Admitting: Occupational Therapy

## 2024-04-20 ENCOUNTER — Ambulatory Visit

## 2024-04-20 DIAGNOSIS — R2689 Other abnormalities of gait and mobility: Secondary | ICD-10-CM

## 2024-04-20 DIAGNOSIS — R41841 Cognitive communication deficit: Secondary | ICD-10-CM | POA: Diagnosis not present

## 2024-04-20 DIAGNOSIS — M6281 Muscle weakness (generalized): Secondary | ICD-10-CM

## 2024-04-20 DIAGNOSIS — R269 Unspecified abnormalities of gait and mobility: Secondary | ICD-10-CM

## 2024-04-20 DIAGNOSIS — R278 Other lack of coordination: Secondary | ICD-10-CM

## 2024-04-20 NOTE — Therapy (Signed)
 OUTPATIENT PHYSICAL THERAPY TREATMENT   Patient Name: Isaac [name] MRN: [id number] DOB:1951-09-08, 73 y.o., male Today's Date: 04/20/2024   PCP: Valora Agent, MD  REFERRING PROVIDER: Urbano Albright, MD    END OF SESSION:   PT End of Session - 04/20/24 0816     Visit Number 6    Number of Visits 24    Date for PT Re-Evaluation 06/27/24    PT Start Time 0804    PT Stop Time 0844    PT Time Calculation (min) 40 min    Activity Tolerance Patient tolerated treatment well;No increased pain    Behavior During Therapy [facility] for tasks assessed/performed            Past Medical History:  Diagnosis Date   Arrhythmia    atrial fibrillation   CHF (congestive heart failure) (HCC)    Chronic kidney disease    Coronary artery disease    Hyperlipidemia    Hypertension    Myocardial infarct [facility])    Past Surgical History:  Procedure Laterality Date   CARDIAC DEFIBRILLATOR PLACEMENT  feb 2014   COLONOSCOPY N/A 03/29/2024   Procedure: COLONOSCOPY;  Surgeon: Legrand Victory LITTIE DOUGLAS, MD;  Location: [facility] ENDOSCOPY;  Service: Gastroenterology;  Laterality: N/A;   ENTEROSCOPY N/A 03/22/2024   Procedure: ENTEROSCOPY;  Surgeon: Leigh Elspeth SQUIBB, MD;  Location: [facility] ENDOSCOPY;  Service: Gastroenterology;  Laterality: N/A;   ESOPHAGOGASTRODUODENOSCOPY N/A 03/29/2024   Procedure: EGD (ESOPHAGOGASTRODUODENOSCOPY);  Surgeon: Legrand Victory LITTIE DOUGLAS, MD;  Location: [facility] ENDOSCOPY;  Service: Gastroenterology;  Laterality: N/A;   ICD GENERATOR CHANGEOUT N/A 02/09/2024   Procedure: ICD GENERATOR CHANGEOUT;  Surgeon: Waddell Danelle ORN, MD;  Location: [facility] INVASIVE CV LAB;  Service: Cardiovascular;  Laterality: N/A;   INSERTION OF IMPLANTABLE LEFT VENTRICULAR ASSIST DEVICE N/A 09/17/2021   Procedure: INSERTION OF IMPLANTABLE LEFT VENTRICULAR ASSIST DEVICE AND INSERTION OF FEMORAL ARTERIAL LINE;  Surgeon: Lucas Dorise POUR, MD;  Location: [facility] OR;  Service: Open Heart Surgery;  Laterality: N/A;   IR FLUORO GUIDE CV  LINE RIGHT  09/27/2021   IR THORACENTESIS ASP PLEURAL SPACE W/IMG GUIDE  09/27/2021   IR US  GUIDE VASC ACCESS RIGHT  09/27/2021   PLACEMENT OF IMPELLA LEFT VENTRICULAR ASSIST DEVICE N/A 09/12/2021   Procedure: PLACEMENT OF IMPELLA 5.5 LEFT VENTRICULAR ASSIST DEVICE;  Surgeon: Kerrin Elspeth BROCKS, MD;  Location: [facility] OR;  Service: Open Heart Surgery;  Laterality: N/A;   RIGHT HEART CATH N/A 09/08/2021   Procedure: RIGHT HEART CATH;  Surgeon: Rolan Ezra RAMAN, MD;  Location: [facility] INVASIVE CV LAB;  Service: Cardiovascular;  Laterality: N/A;   TEE WITHOUT CARDIOVERSION N/A 09/12/2021   Procedure: TRANSESOPHAGEAL ECHOCARDIOGRAM (TEE);  Surgeon: Kerrin Elspeth BROCKS, MD;  Location: [facility] OR;  Service: Open Heart Surgery;  Laterality: N/A;   TEE WITHOUT CARDIOVERSION N/A 09/17/2021   Procedure: TRANSESOPHAGEAL ECHOCARDIOGRAM (TEE);  Surgeon: Lucas Dorise POUR, MD;  Location: [facility] OR;  Service: Open Heart Surgery;  Laterality: N/A;   Patient Active Problem List   Diagnosis Date Noted   Acute gastric ulcer with hemorrhage 03/29/2024   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   Melena 03/22/2024   ABLA (acute blood loss anemia) 03/22/2024   Anticoagulated 03/22/2024   AVM (arteriovenous malformation) of small bowel, acquired with hemorrhage 03/22/2024   Upper GI bleed 03/21/2024   Hematoma 02/17/2024   Anemia 02/15/2024   Hypotension 02/15/2024   Sundowning 02/11/2024   Insomnia 02/11/2024   Constipation 02/11/2024   Stage 3b chronic kidney disease (HCC) 02/11/2024  Cardioembolic stroke (HCC) 02/10/2024   Acute left PCA stroke (HCC) 02/05/2024   Abnormal thyroid  blood test 04/05/2023   Abscess of left groin 11/27/2022   VT (ventricular tachycardia) (HCC) 10/27/2022   ICD (implantable cardioverter-defibrillator) in place 10/27/2022   Pressure injury of skin 10/07/2021   LVAD (left ventricular assist device) present (HCC)    Protein-calorie malnutrition, severe 09/10/2021   Elevated TSH    PICC (peripherally inserted  central catheter) in place    Cardiogenic shock (HCC)    Syncope 09/07/2021   CHF (congestive heart failure) (HCC) 09/06/2021   CAD (coronary artery disease) 08/22/2021   Cardiomyopathy (HCC) 08/22/2021   Mixed hyperlipidemia 08/22/2021   A-fib (HCC) 08/22/2021   Elevated troponin 08/22/2021   Acute on chronic combined systolic and diastolic CHF (congestive heart failure) (HCC) 08/22/2021   Benign essential hypertension 02/22/2015   Chronic systolic heart failure (HCC) 07/05/2014    ONSET DATE: 02/05/24  REFERRING DIAG:  P36.467 (ICD-10-CM) - Cerebral infarction due to unspecified occlusion or stenosis of left posterior cerebral artery   THERAPY DIAG:  Muscle weakness (generalized)  Imbalance  Abnormality of gait  Other lack of coordination  Rationale for Evaluation and Treatment: Rehabilitation  SUBJECTIVE:                                                                                                                                                                                             SUBJECTIVE STATEMENT:   No updates pt feeling good today. Says no specific plans this weekend.   PERTINENT HISTORY:  Presented 02/05/2024 s/p fall, right sided weakness and aphasia with blurred vision. CT positive for acute  left PCA left cerebellar infarction. Admitted to  rehab 02/10/2024 for inpatient therapies to consist of PT, ST and OT at least three hours five days a week. Started OPPT here, for 7 visits, then admitted to [facility] for GIB 03/21/24. PMH:  Atrial fibrillation/VT chronic systolic congestive heart failure status post LVAD 09/17/2021.   PAIN:  Are you having pain? No  PRECAUTIONS: ICD/Pacemaker and Other: LVAD  WEIGHT BEARING RESTRICTIONS: None  FALLS: Has patient fallen in last 6 months? No  LIVING ENVIRONMENT: Lives with: lives with their spouse Stevie) Lives in: House/apartment Stairs: Yes: Internal: 9 steps; on left going up Has following equipment at home:  Single point cane, Walker - 2 wheeled, and does not use  PLOF: Independent with basic ADLs and Independent with household mobility without device, driving  PATIENT GOALS: pt reports the sight in R visual field is limited. Improve strength in R LE primarily. Improve balance slightly.   OBJECTIVE:  Note: Objective measures  were completed at Evaluation unless otherwise noted.    GAIT: Findings: Gait Characteristics: occasional decreased R foot clearance during swing, step through pattern, decreased arm swing- Right, decreased arm swing- Left, and decreased stance time- Right, Distance walked: 60, Assistive device utilized:None, and Level of assistance: Modified independence and SBA  FUNCTIONAL TESTS:  5 times sit to stand: 15.92 seconds 6 minute walk test: 1284 feet (391 meters, Avg speed 1.87m/s), no AD 10 meter walk test: need to assess FGA: need to assess Mini-Best Test: need to assess  PATIENT SURVEYS:  Stroke Impact Scale 72%                                                                                                                              TREATMENT DATE: 04/20/2024   -Overground AMB, no device, no LOB: 3 laps L 1326ft 82m10sec -sts hands free x12 -narrow stance on foam EO x 60sec, eyes closed 5x10sec -sts hands free x12 -tandem stance on foam: 5x15sec bilat, alternating sides  -9-10lb carry for the following: AMB 4 stairs up and down forward 4x each; then side stepping up and down 2x each way -5lb AW: 13ft forward walking sprint and 68ft backward walking (3 times each)    PATIENT EDUCATION: Education details: therapy POC, findings from assessment, continuation of prior HEP Person educated: Patient Education method: Explanation Education comprehension: verbalized understanding and needs further education  HOME EXERCISE PROGRAM: Access Code: [email or web address] URL: [email or web address] Date: 04/10/2024 Prepared by: Peggye Linear  Exercises - Tandem Stance  with Support  - 1 x daily - 4 x weekly - 3 sets - 4 reps - 20 seconds  hold - Forward and Backward Walking with Eyes Closed and Counter Support  - 1 x daily - 4 x weekly - 3 sets - 5 reps - Side Stepping with Resistance at Thighs and Counter Support  - 1 x daily - 7 x weekly - 2 sets - 10 reps - Forward Step Up with Unilateral Counter Support  - 1 x daily - 7 x weekly - 1-2 sets - 10 reps - Walking  - 3 x daily - 7 x weekly - 1 sets - 6 minutes hold - Sit to Stand with Arms Crossed  - 3 x daily - 7 x weekly - 1 sets - 10 reps  GOALS: Goals reviewed with patient? Yes SHORT TERM GOALS: Target date: 05/16/2024  Patient will be independent in home exercise program to improve strength/mobility for better functional independence with ADLs. Baseline: carried forward prior HEP, will benefit form review and progression Goal status: INITIAL  LONG TERM GOALS: Target date: 06/27/2024 Patient will increase SIS 16 score to equal to or greater than 10 points to demonstrate statistically significant improvement in mobility and quality of life.  Baseline: 72% Goal status: INITIAL  2.  Patient (> 6 years old) will complete five times sit to stand test in < 15 seconds indicating an increased LE strength and improved  balance. Baseline:  15.92 seconds Goal status: INITIAL  3.  Patient will increase MiniBest Test score by at least 5 more points to indicate a reduced risk for falling and demonstrate increased independence with functional mobility and ADLs.  Baseline: need to assess; 04/06/24: 21/28 Goal status: ON GOING  4.  Patient will increase FGA  score to >24/30 as to demonstrate reduced fall risk and improved dynamic gait balance for better safety with community/home ambulation. Baseline: need to assess; 04/06/24: 26/30 Goal status: MET  5.  Patient will increase 6 min walk test to >1440ft to demonstrate decreased fall risk during functional activities as well as improved access to community mobility.   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 More LOB today on side stpeping stairs due ot precision foot requirements. Pt takes rest breaks seated. Patient will benefit from skilled physical therapy intervention to reduce deficits and impairments identified in evaluation, in order to reduce pain, improve quality of life, and maximize activity tolerance for ADL, IADL, and leisure/fitness. Physical therapy will help pt achieve long and short term goals of care.   OBJECTIVE IMPAIRMENTS: Abnormal gait, cardiopulmonary status limiting activity, decreased activity tolerance, decreased balance, decreased coordination, decreased endurance, decreased mobility, difficulty walking, decreased strength, and improper body mechanics.   ACTIVITY LIMITATIONS: carrying, lifting, squatting, stairs, locomotion level, and caring for others  PARTICIPATION LIMITATIONS: cleaning, laundry, driving, shopping, community activity, occupation, and yard work  PERSONAL FACTORS: Age and 3+ comorbidities: LVAD, Pacemaker, hypotension, Stage 3b CKD  are also affecting patient's functional outcome.   REHAB POTENTIAL: Excellent  CLINICAL DECISION MAKING: Evolving/moderate complexity  EVALUATION COMPLEXITY: Moderate  PLAN:  PT FREQUENCY: 1-2x/week  PT DURATION: 12 weeks  PLANNED INTERVENTIONS: 97164- PT Re-evaluation, 97750- Physical Performance Testing, 97110-Therapeutic exercises, 97530- Therapeutic activity, 97112- Neuromuscular re-education, 97535- Self Care, 02859- Manual therapy, [phone]- Gait training, [phone]- Canalith repositioning, Patient/Family education, Balance training, Stair training, Taping, Vestibular training, DME instructions, Cryotherapy, Moist heat, and Biofeedback   PLAN FOR NEXT SESSION:  Continue to assess HEP  Dynamic balance and  mobility   8:18 AM, 04/20/24 Peggye JAYSON Linear, PT, DPT Physical Therapist - [facility]  [phone] [facility])

## 2024-04-20 NOTE — Therapy (Addendum)
 OUTPATIENT SPEECH LANGUAGE PATHOLOGY  COGNITIVE COMMUNICATION TREATMENT NOTE   Patient Name: Isaac [name] MRN: [id number] DOB:03-29-1951, 73 y.o., male Today's Date: 04/20/2024  PCP: Lynwood Null, MD REFERRING PROVIDER: Toribio Pitch, PA   End of Session - 04/20/24 0847     Visit Number 13    Number of Visits 25    Date for SLP Re-Evaluation 05/17/24    Authorization Type [employer] HMO/PPO    Progress Note Due on Visit 20    SLP Start Time 0930    Activity Tolerance Patient tolerated treatment well          Past Medical History:  Diagnosis Date   Arrhythmia    atrial fibrillation   CHF (congestive heart failure) (HCC)    Chronic kidney disease    Coronary artery disease    Hyperlipidemia    Hypertension    Myocardial infarct [facility])    Past Surgical History:  Procedure Laterality Date   CARDIAC DEFIBRILLATOR PLACEMENT  feb 2014   COLONOSCOPY N/A 03/29/2024   Procedure: COLONOSCOPY;  Surgeon: Legrand Victory LITTIE DOUGLAS, MD;  Location: [facility] ENDOSCOPY;  Service: Gastroenterology;  Laterality: N/A;   ENTEROSCOPY N/A 03/22/2024   Procedure: ENTEROSCOPY;  Surgeon: Leigh Elspeth SQUIBB, MD;  Location: [facility] ENDOSCOPY;  Service: Gastroenterology;  Laterality: N/A;   ESOPHAGOGASTRODUODENOSCOPY N/A 03/29/2024   Procedure: EGD (ESOPHAGOGASTRODUODENOSCOPY);  Surgeon: Legrand Victory LITTIE DOUGLAS, MD;  Location: [facility] ENDOSCOPY;  Service: Gastroenterology;  Laterality: N/A;   ICD GENERATOR CHANGEOUT N/A 02/09/2024   Procedure: ICD GENERATOR CHANGEOUT;  Surgeon: Waddell Danelle ORN, MD;  Location: [facility] INVASIVE CV LAB;  Service: Cardiovascular;  Laterality: N/A;   INSERTION OF IMPLANTABLE LEFT VENTRICULAR ASSIST DEVICE N/A 09/17/2021   Procedure: INSERTION OF IMPLANTABLE LEFT VENTRICULAR ASSIST DEVICE AND INSERTION OF FEMORAL ARTERIAL LINE;  Surgeon: Lucas Dorise POUR, MD;  Location: [facility] OR;  Service: Open Heart Surgery;  Laterality: N/A;   IR FLUORO GUIDE CV LINE RIGHT  09/27/2021   IR THORACENTESIS ASP PLEURAL  SPACE W/IMG GUIDE  09/27/2021   IR US  GUIDE VASC ACCESS RIGHT  09/27/2021   PLACEMENT OF IMPELLA LEFT VENTRICULAR ASSIST DEVICE N/A 09/12/2021   Procedure: PLACEMENT OF IMPELLA 5.5 LEFT VENTRICULAR ASSIST DEVICE;  Surgeon: Kerrin Elspeth BROCKS, MD;  Location: [facility] OR;  Service: Open Heart Surgery;  Laterality: N/A;   RIGHT HEART CATH N/A 09/08/2021   Procedure: RIGHT HEART CATH;  Surgeon: Rolan Ezra RAMAN, MD;  Location: [facility] INVASIVE CV LAB;  Service: Cardiovascular;  Laterality: N/A;   TEE WITHOUT CARDIOVERSION N/A 09/12/2021   Procedure: TRANSESOPHAGEAL ECHOCARDIOGRAM (TEE);  Surgeon: Kerrin Elspeth BROCKS, MD;  Location: [facility] OR;  Service: Open Heart Surgery;  Laterality: N/A;   TEE WITHOUT CARDIOVERSION N/A 09/17/2021   Procedure: TRANSESOPHAGEAL ECHOCARDIOGRAM (TEE);  Surgeon: Lucas Dorise POUR, MD;  Location: [facility] OR;  Service: Open Heart Surgery;  Laterality: N/A;   Patient Active Problem List   Diagnosis Date Noted   Acute gastric ulcer with hemorrhage 03/29/2024   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   Melena 03/22/2024   ABLA (acute blood loss anemia) 03/22/2024   Anticoagulated 03/22/2024   AVM (arteriovenous malformation) of small bowel, acquired with hemorrhage 03/22/2024   Upper GI bleed 03/21/2024   Hematoma 02/17/2024   Anemia 02/15/2024   Hypotension 02/15/2024   Sundowning 02/11/2024   Insomnia 02/11/2024   Constipation 02/11/2024   Stage 3b chronic kidney disease (HCC) 02/11/2024   Cardioembolic stroke (HCC) 02/10/2024   Acute left PCA stroke (HCC) 02/05/2024   Abnormal thyroid   blood test 04/05/2023   Abscess of left groin 11/27/2022   VT (ventricular tachycardia) (HCC) 10/27/2022   ICD (implantable cardioverter-defibrillator) in place 10/27/2022   Pressure injury of skin 10/07/2021   LVAD (left ventricular assist device) present (HCC)    Protein-calorie malnutrition, severe 09/10/2021   Elevated TSH    PICC (peripherally inserted central catheter) in place    Cardiogenic shock (HCC)     Syncope 09/07/2021   CHF (congestive heart failure) (HCC) 09/06/2021   CAD (coronary artery disease) 08/22/2021   Cardiomyopathy (HCC) 08/22/2021   Mixed hyperlipidemia 08/22/2021   A-fib (HCC) 08/22/2021   Elevated troponin 08/22/2021   Acute on chronic combined systolic and diastolic CHF (congestive heart failure) (HCC) 08/22/2021   Benign essential hypertension 02/22/2015   Chronic systolic heart failure (HCC) 07/05/2014    ONSET DATE: 02/05/2024; date of referral  02/22/2024  REFERRING DIAG: P36.467 (ICD-10-CM) - Cerebral infarction due to unspecified occlusion or stenosis of left posterior cerebral artery  THERAPY DIAG:  Cognitive communication deficit  Rationale for Evaluation and Treatment Rehabilitation  SUBJECTIVE:   PERTINENT HISTORY and DIAGNOSTIC FINDINGS:  Mr. Matters is a 73 y.o. with history of CAD, HTN, atrial fibrillation, CKD IV, VT and chronic systolic CHFs/p HM-3 LVAD on 09/17/21.  He presented to the [facility] ED on 02/05/24 for focal neuro deficits with R vision field cut and dysmetria. Code Stroke activated. CT brain positive for acute to subacute appearing Left PCA and Left cerebellar infarcts. Hyperdense Left PCA suggests occlusion of that vessel. No hemorrhagic transformation or mass effect. Seen by Neuro and decision made to treat with IV heparin . Echocardiogram shows EF 25 to 30% with LVAD at the apex.  Left atrial is moderately dilate. CT angio distal left P2/P3 occlusion.Repeat CT head 02/06/24 with evolving left cerebellar, left PCA and right frontal gyrus MCA branch infarct. No hemorrhage.  Neurology feels infarcts 2/2 LVAD and signed off  Pt attended [facility] CIR from until 02/18/2024, received ST services targeting cognitive linguistic abilities and discharged at MOD A for cognitive linguistic skills. Memory/orientation deficits remain the pt's biggest barrier to progress and overall safety.   He was admitted on 03/21/2024 from the office for  GI bleed with hemoglobin down to 6.4. Coumadin  was held. He was placed on PPI. GI was consulted and he was transfused multiple PRBCs (total of 7 units this admission). He underwent EGD on 7/2 with friable tissue in the duodenum and 4 AVMS which were treated with APC. INR goal was adjusted to 2-2.5. Unfortunately, he continued to have melena and his hemoglobin continued to drop. He underwent repeat EGD + colonoscopy on 7/9  which showed 1 non-bleeding superficial gastric ulcer and 1 clip was placed. Colonoscopy unremarkable. No further bleeding or drop in hemoglobin. INR 1.8 on day of discharge.   PAIN:  Are you having pain? No   FALLS: Has patient fallen in last 6 months?  No  LIVING ENVIRONMENT: Lives with: lives with their spouse Lives in: House/apartment  PLOF:  Level of assistance: Independent with ADLs, Independent with IADLs Employment: Retired   PATIENT GOALS   to improve functional independence  SUBJECTIVE STATEMENT: Pt's wife came into session to ask some questions about recommendation to see neuro-opthalmology Pt accompanied by: self  OBJECTIVE:  TODAY'S TREATMENT:   Skilled treatment session focused on pt's cognitive communication goals. SLP facilitated session by providing the following interventions:  Pt and his wife had questions regarding recommendation for neuro-ophthalmology. During this conversation pt mentioned  that he didn't go to an appt scheduled with Dr Murray because the appt read PT, I already see a PT so I didn't see any need to see Dr Murray so I didn't go. Education provided on always calling to inquire about an appt before not going. Education also provided on Dr Murray being PMR MD and highly recommend continuing appts with him especially for any referrals to specialists.   SLP further facilitated session by providing semi-complex visual scanning tasks.  When asked to circle double numbers on a line and then count how many were present he was Rare Min A for  98% accuracy. Pt demonstrated much improved selective attention to task of crossing out all the double numbers    PATIENT EDUCATION: Education details: see above Person educated: Patient (pt's wife stayed in lobby with their grandson) Education method: Explanation Education comprehension: verbalized understanding   HOME EXERCISE PROGRAM:   Set alarms on phone [facility], see above   GOALS:  Goals reviewed with patient? Yes  SHORT TERM GOALS: Target date: 10 sessions  With Mod A, pt will utilize external memory aids to answer questions regarding orientation information with 80% accuracy.  Baseline: Goal status: INITIAL  2.  With Mod A, pt will improve word finding by using strategies to produce generative naming list of equipment and vocabulary related to LVAD.  Baseline:  Goal status: INITIAL  3.  Pt will complete addition cognitive assessment to establish current level of ability.  Baseline: Pt discharged from CIR requiring Mod A for basic cognitive function.  Goal status: INITIAL   LONG TERM GOALS: Target date: 05/17/2024  With supervision A, pt will attend to items on his right to improve safety awareness and accuracy of task completion in 5 out of 7 opportunities.  Baseline:  Goal status: INITIAL  2.  With Min A, pt will external and internal memory aides to help short term and prospective memory in 5 out of 7 opportunities.  Baseline:  Goal status: INITIAL  3.  With supervision A, pt will be able to verbally sequence (using appropriate vocabulary) LVAD features, alerts and switching battery sources.  Baseline:  Goal status: INITIAL   ASSESSMENT:  CLINICAL IMPRESSION: Patient is a 73 y.o. male LVAD patient, who was seen today for a cognitive communication treatment d/t left cerebellar, left PCA and right frontal gyrus MCA branch infarct. Pt presents with severe cognitive impairment that is c/b by severe deficits in attention, awareness (pt is aware of  difficulty/poor performance within tasks but unaware of overall impairment and impact on safety), profound memory deficits in immediate recall, short term memory, retrieval, visual, auditory and prospective memory decreased awareness of confabulatory responses and severe deficits in executive function as observed during visuospatial tasks, specifically task planning, organization, sequencing with pt losing points d/t slow task completion. Overall adequate expressive and receptive communication.   Current goals continue to be appropriate with pt demonstrating progress in today's session. Pt continues to be eager and puts forth great effort. See the above treatment note for details.     OBJECTIVE IMPAIRMENTS include attention, memory, awareness, and executive functioning. These impairments are limiting patient from managing medications, managing appointments, managing finances, household responsibilities, and ADLs/IADLs. Factors affecting potential to achieve goals and functional outcome are ability to learn/carryover information, co-morbidities, medical prognosis, previous level of function, and severity of impairments. Patient will benefit from skilled SLP services to address above impairments and improve overall function.  REHAB POTENTIAL: Good  PLAN: SLP FREQUENCY: 1-2x/week  SLP DURATION: 12 weeks  PLANNED INTERVENTIONS: Language facilitation, Environmental controls, Cueing hierachy, Cognitive reorganization, Internal/external aids, Functional tasks, SLP instruction and feedback, Compensatory strategies, and Patient/family education   Jaki Steptoe B. Rubbie, M.S., CCC-SLP, Tree surgeon Certified Brain Injury Specialist [facility]  [facility] Rehabilitation Services Office [phone] Ascom [phone] Fax [phone]

## 2024-04-21 ENCOUNTER — Other Ambulatory Visit: Payer: Self-pay

## 2024-04-21 ENCOUNTER — Ambulatory Visit (HOSPITAL_COMMUNITY): Payer: Self-pay | Admitting: Pharmacist

## 2024-04-21 LAB — POCT INR: INR: 1.7 — AB (ref 2.0–3.0)

## 2024-04-24 ENCOUNTER — Ambulatory Visit: Admitting: Physical Therapy

## 2024-04-24 ENCOUNTER — Ambulatory Visit: Attending: Physician Assistant | Admitting: Speech Pathology

## 2024-04-24 DIAGNOSIS — R2689 Other abnormalities of gait and mobility: Secondary | ICD-10-CM | POA: Diagnosis present

## 2024-04-24 DIAGNOSIS — Z95811 Presence of heart assist device: Secondary | ICD-10-CM | POA: Insufficient documentation

## 2024-04-24 DIAGNOSIS — R41841 Cognitive communication deficit: Secondary | ICD-10-CM | POA: Insufficient documentation

## 2024-04-24 DIAGNOSIS — M6281 Muscle weakness (generalized): Secondary | ICD-10-CM

## 2024-04-24 DIAGNOSIS — R269 Unspecified abnormalities of gait and mobility: Secondary | ICD-10-CM

## 2024-04-24 DIAGNOSIS — R278 Other lack of coordination: Secondary | ICD-10-CM | POA: Insufficient documentation

## 2024-04-24 DIAGNOSIS — I639 Cerebral infarction, unspecified: Secondary | ICD-10-CM | POA: Diagnosis present

## 2024-04-24 NOTE — Therapy (Signed)
 OUTPATIENT PHYSICAL THERAPY TREATMENT   Patient Name: Isaac [name] MRN: [id number] DOB:12/09/1950, 73 y.o., male Today's Date: 04/24/2024   PCP: Valora Agent, MD  REFERRING PROVIDER: Urbano Albright, MD    END OF SESSION:   PT End of Session - 04/24/24 0853     Visit Number 7    Number of Visits 24    Date for PT Re-Evaluation 06/27/24    PT Start Time 0851    PT Stop Time 0930    PT Time Calculation (min) 39 min    Activity Tolerance Patient tolerated treatment well;No increased pain    Behavior During Therapy [facility] for tasks assessed/performed            Past Medical History:  Diagnosis Date   Arrhythmia    atrial fibrillation   CHF (congestive heart failure) (HCC)    Chronic kidney disease    Coronary artery disease    Hyperlipidemia    Hypertension    Myocardial infarct [facility])    Past Surgical History:  Procedure Laterality Date   CARDIAC DEFIBRILLATOR PLACEMENT  feb 2014   COLONOSCOPY N/A 03/29/2024   Procedure: COLONOSCOPY;  Surgeon: Legrand Victory LITTIE DOUGLAS, MD;  Location: [facility] ENDOSCOPY;  Service: Gastroenterology;  Laterality: N/A;   ENTEROSCOPY N/A 03/22/2024   Procedure: ENTEROSCOPY;  Surgeon: Leigh Elspeth SQUIBB, MD;  Location: [facility] ENDOSCOPY;  Service: Gastroenterology;  Laterality: N/A;   ESOPHAGOGASTRODUODENOSCOPY N/A 03/29/2024   Procedure: EGD (ESOPHAGOGASTRODUODENOSCOPY);  Surgeon: Legrand Victory LITTIE DOUGLAS, MD;  Location: [facility] ENDOSCOPY;  Service: Gastroenterology;  Laterality: N/A;   ICD GENERATOR CHANGEOUT N/A 02/09/2024   Procedure: ICD GENERATOR CHANGEOUT;  Surgeon: Waddell Danelle ORN, MD;  Location: [facility] INVASIVE CV LAB;  Service: Cardiovascular;  Laterality: N/A;   INSERTION OF IMPLANTABLE LEFT VENTRICULAR ASSIST DEVICE N/A 09/17/2021   Procedure: INSERTION OF IMPLANTABLE LEFT VENTRICULAR ASSIST DEVICE AND INSERTION OF FEMORAL ARTERIAL LINE;  Surgeon: Lucas Dorise POUR, MD;  Location: [facility] OR;  Service: Open Heart Surgery;  Laterality: N/A;   IR FLUORO GUIDE CV  LINE RIGHT  09/27/2021   IR THORACENTESIS ASP PLEURAL SPACE W/IMG GUIDE  09/27/2021   IR US  GUIDE VASC ACCESS RIGHT  09/27/2021   PLACEMENT OF IMPELLA LEFT VENTRICULAR ASSIST DEVICE N/A 09/12/2021   Procedure: PLACEMENT OF IMPELLA 5.5 LEFT VENTRICULAR ASSIST DEVICE;  Surgeon: Kerrin Elspeth BROCKS, MD;  Location: [facility] OR;  Service: Open Heart Surgery;  Laterality: N/A;   RIGHT HEART CATH N/A 09/08/2021   Procedure: RIGHT HEART CATH;  Surgeon: Rolan Ezra RAMAN, MD;  Location: [facility] INVASIVE CV LAB;  Service: Cardiovascular;  Laterality: N/A;   TEE WITHOUT CARDIOVERSION N/A 09/12/2021   Procedure: TRANSESOPHAGEAL ECHOCARDIOGRAM (TEE);  Surgeon: Kerrin Elspeth BROCKS, MD;  Location: [facility] OR;  Service: Open Heart Surgery;  Laterality: N/A;   TEE WITHOUT CARDIOVERSION N/A 09/17/2021   Procedure: TRANSESOPHAGEAL ECHOCARDIOGRAM (TEE);  Surgeon: Lucas Dorise POUR, MD;  Location: [facility] OR;  Service: Open Heart Surgery;  Laterality: N/A;   Patient Active Problem List   Diagnosis Date Noted   Acute gastric ulcer with hemorrhage 03/29/2024   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   Melena 03/22/2024   ABLA (acute blood loss anemia) 03/22/2024   Anticoagulated 03/22/2024   AVM (arteriovenous malformation) of small bowel, acquired with hemorrhage 03/22/2024   Upper GI bleed 03/21/2024   Hematoma 02/17/2024   Anemia 02/15/2024   Hypotension 02/15/2024   Sundowning 02/11/2024   Insomnia 02/11/2024   Constipation 02/11/2024   Stage 3b chronic kidney disease (HCC) 02/11/2024  Cardioembolic stroke (HCC) 02/10/2024   Acute left PCA stroke (HCC) 02/05/2024   Abnormal thyroid  blood test 04/05/2023   Abscess of left groin 11/27/2022   VT (ventricular tachycardia) (HCC) 10/27/2022   ICD (implantable cardioverter-defibrillator) in place 10/27/2022   Pressure injury of skin 10/07/2021   LVAD (left ventricular assist device) present (HCC)    Protein-calorie malnutrition, severe 09/10/2021   Elevated TSH    PICC (peripherally inserted  central catheter) in place    Cardiogenic shock (HCC)    Syncope 09/07/2021   CHF (congestive heart failure) (HCC) 09/06/2021   CAD (coronary artery disease) 08/22/2021   Cardiomyopathy (HCC) 08/22/2021   Mixed hyperlipidemia 08/22/2021   A-fib (HCC) 08/22/2021   Elevated troponin 08/22/2021   Acute on chronic combined systolic and diastolic CHF (congestive heart failure) (HCC) 08/22/2021   Benign essential hypertension 02/22/2015   Chronic systolic heart failure (HCC) 07/05/2014    ONSET DATE: 02/05/24  REFERRING DIAG:  P36.467 (ICD-10-CM) - Cerebral infarction due to unspecified occlusion or stenosis of left posterior cerebral artery   THERAPY DIAG:  Muscle weakness (generalized)  Abnormality of gait  Imbalance  Rationale for Evaluation and Treatment: Rehabilitation  SUBJECTIVE:                                                                                                                                                                                             SUBJECTIVE STATEMENT:   No updates pt feeling good today. No medical updates. Had an uneventful weekend.   PERTINENT HISTORY:  Presented 02/05/2024 s/p fall, right sided weakness and aphasia with blurred vision. CT positive for acute  left PCA left cerebellar infarction. Admitted to  rehab 02/10/2024 for inpatient therapies to consist of PT, ST and OT at least three hours five days a week. Started OPPT here, for 7 visits, then admitted to [facility] for GIB 03/21/24. PMH:  Atrial fibrillation/VT chronic systolic congestive heart failure status post LVAD 09/17/2021.   PAIN:  Are you having pain? No  PRECAUTIONS: ICD/Pacemaker and Other: LVAD  WEIGHT BEARING RESTRICTIONS: None  FALLS: Has patient fallen in last 6 months? No  LIVING ENVIRONMENT: Lives with: lives with their spouse Stevie) Lives in: House/apartment Stairs: Yes: Internal: 9 steps; on left going up Has following equipment at home: Single point cane,  Walker - 2 wheeled, and does not use  PLOF: Independent with basic ADLs and Independent with household mobility without device, driving  PATIENT GOALS: pt reports the sight in R visual field is limited. Improve strength in R LE primarily. Improve balance slightly.   OBJECTIVE:  Note: Objective measures were completed at Evaluation  unless otherwise noted.    GAIT: Findings: Gait Characteristics: occasional decreased R foot clearance during swing, step through pattern, decreased arm swing- Right, decreased arm swing- Left, and decreased stance time- Right, Distance walked: 60, Assistive device utilized:None, and Level of assistance: Modified independence and SBA  FUNCTIONAL TESTS:  5 times sit to stand: 15.92 seconds 6 minute walk test: 1284 feet (391 meters, Avg speed 1.60m/s), no AD 10 meter walk test: need to assess FGA: need to assess Mini-Best Test: need to assess  PATIENT SURVEYS:  Stroke Impact Scale 72%                                                                                                                              TREATMENT DATE: 04/24/2024   - gait without resistance x 191ft over ground + 580ft on treadmill. 1.59mph  - Side stepping over 2 hurdles and up/dow 4 inch step x 5 bil.  - reciprocal foot tap on 8 inch step from airex pad x 20 bil  - forward step up 8 inch step from 8 inch step x 12 bil. Increased difficulty with reverse step to airex pad leading with the RLE.  - sit stand from arm chair x 10  - sit<>stand with 1KG ball toss x 10. Dropped ball on last repetition. As pt looking to the L when ball tracks to R side of torso.  - single limb step up to 8 inch step x 8 bil with no UE support  - side stepping R and L x 10 ft bil and forward/reverse 28ft bil around treadmill   Increased difficulty with reverse gait with hesitation to initiation posterior stepping with the RLE in figure 4 as well as stepping off elevated aerobic step.    PATIENT EDUCATION: Education  details: therapy POC, findings from assessment, continuation of prior HEP Person educated: Patient Education method: Explanation Education comprehension: verbalized understanding and needs further education  HOME EXERCISE PROGRAM: Access Code: [email or web address] URL: [email or web address] Date: 04/10/2024 Prepared by: Peggye Linear  Exercises - Tandem Stance with Support  - 1 x daily - 4 x weekly - 3 sets - 4 reps - 20 seconds  hold - Forward and Backward Walking with Eyes Closed and Counter Support  - 1 x daily - 4 x weekly - 3 sets - 5 reps - Side Stepping with Resistance at Thighs and Counter Support  - 1 x daily - 7 x weekly - 2 sets - 10 reps - Forward Step Up with Unilateral Counter Support  - 1 x daily - 7 x weekly - 1-2 sets - 10 reps - Walking  - 3 x daily - 7 x weekly - 1 sets - 6 minutes hold - Sit to Stand with Arms Crossed  - 3 x daily - 7 x weekly - 1 sets - 10 reps  GOALS: Goals reviewed with patient? Yes SHORT TERM GOALS: Target date: 05/16/2024  Patient will be independent in home  exercise program to improve strength/mobility for better functional independence with ADLs. Baseline: carried forward prior HEP, will benefit form review and progression Goal status: INITIAL  LONG TERM GOALS: Target date: 06/27/2024 Patient will increase SIS 16 score to equal to or greater than 10 points to demonstrate statistically significant improvement in mobility and quality of life.  Baseline: 72% Goal status: INITIAL  2.  Patient (> 81 years old) will complete five times sit to stand test in < 15 seconds indicating an increased LE strength and improved balance. Baseline:  15.92 seconds Goal status: INITIAL  3.  Patient will increase MiniBest Test score by at least 5 more points to indicate a reduced risk for falling and demonstrate increased independence with functional mobility and ADLs.  Baseline: need to assess; 04/06/24: 21/28 Goal status: ON GOING  4.  Patient will  increase FGA  score to >24/30 as to demonstrate reduced fall risk and improved dynamic gait balance for better safety with community/home ambulation. Baseline: need to assess; 04/06/24: 26/30 Goal status: MET  5.  Patient will increase 6 min walk test to >1486ft to demonstrate decreased fall risk during functional activities as well as improved access to community mobility.  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 Greatest difficulty with posterior stepping off elevated step and reverse gait around tread mill. Was able to improve stepping strategy and length with increased repetitions. Patient will benefit from skilled physical therapy intervention to reduce deficits and impairments identified in evaluation, in order to reduce pain, improve quality of life, and maximize activity tolerance for ADL, IADL, and leisure/fitness. Physical therapy will help pt achieve long and short term goals of care.   OBJECTIVE IMPAIRMENTS: Abnormal gait, cardiopulmonary status limiting activity, decreased activity tolerance, decreased balance, decreased coordination, decreased endurance, decreased mobility, difficulty walking, decreased strength, and improper body mechanics.   ACTIVITY LIMITATIONS: carrying, lifting, squatting, stairs, locomotion level, and caring for others  PARTICIPATION LIMITATIONS: cleaning, laundry, driving, shopping, community activity, occupation, and yard work  PERSONAL FACTORS: Age and 3+ comorbidities: LVAD, Pacemaker, hypotension, Stage 3b CKD  are also affecting patient's functional outcome.   REHAB POTENTIAL: Excellent  CLINICAL DECISION MAKING: Evolving/moderate complexity  EVALUATION COMPLEXITY: Moderate  PLAN:  PT FREQUENCY: 1-2x/week  PT DURATION: 12 weeks  PLANNED INTERVENTIONS: 97164- PT Re-evaluation,  97750- Physical Performance Testing, 97110-Therapeutic exercises, 97530- Therapeutic activity, 97112- Neuromuscular re-education, 97535- Self Care, [id number]- Manual therapy, [phone]- Gait training, [phone]- Canalith repositioning, Patient/Family education, Balance training, Stair training, Taping, Vestibular training, DME instructions, Cryotherapy, Moist heat, and Biofeedback   PLAN FOR NEXT SESSION:  Continue to assess HEP  Dynamic balance and mobility   Massie Dollar PT, DPT  Physical Therapist - [facility]  [facility]  10:09 AM 04/24/24

## 2024-04-24 NOTE — Therapy (Signed)
 OUTPATIENT SPEECH LANGUAGE PATHOLOGY  COGNITIVE COMMUNICATION TREATMENT NOTE   Patient Name: Isaac [name] MRN: [id number] DOB:1951-04-03, 73 y.o., male Today's Date: 04/24/2024  PCP: Isaac Null, MD REFERRING PROVIDER: Toribio Pitch, PA   End of Session - 04/24/24 0941     Visit Number 14    Number of Visits 25    Date for SLP Re-Evaluation 05/17/24    Authorization Type [employer] HMO/PPO    Progress Note Due on Visit 20    SLP Start Time 0930    SLP Stop Time  1015    SLP Time Calculation (min) 45 min    Activity Tolerance Patient tolerated treatment well          Past Medical History:  Diagnosis Date   Arrhythmia    atrial fibrillation   CHF (congestive heart failure) (HCC)    Chronic kidney disease    Coronary artery disease    Hyperlipidemia    Hypertension    Myocardial infarct [facility])    Past Surgical History:  Procedure Laterality Date   CARDIAC DEFIBRILLATOR PLACEMENT  feb 2014   COLONOSCOPY N/A 03/29/2024   Procedure: COLONOSCOPY;  Surgeon: Isaac Victory LITTIE DOUGLAS, MD;  Location[facility] ENDOSCOPY;  Service: Gastroenterology;  Laterality: N/A;   ENTEROSCOPY N/A 03/22/2024   Procedure: ENTEROSCOPY;  Surgeon: Isaac Elspeth SQUIBB, MD;  Location: [facility] ENDOSCOPY;  Service: Gastroenterology;  Laterality: N/A;   ESOPHAGOGASTRODUODENOSCOPY N/A 03/29/2024   Procedure: EGD (ESOPHAGOGASTRODUODENOSCOPY);  Surgeon: Isaac Victory LITTIE DOUGLAS, MD;  Location: [facility] ENDOSCOPY;  Service: Gastroenterology;  Laterality: N/A;   ICD GENERATOR CHANGEOUT N/A 02/09/2024   Procedure: ICD GENERATOR CHANGEOUT;  Surgeon: Isaac Danelle ORN, MD;  Location: [facility] INVASIVE CV LAB;  Service: Cardiovascular;  Laterality: N/A;   INSERTION OF IMPLANTABLE LEFT VENTRICULAR ASSIST DEVICE N/A 09/17/2021   Procedure: INSERTION OF IMPLANTABLE LEFT VENTRICULAR ASSIST DEVICE AND INSERTION OF FEMORAL ARTERIAL LINE;  Surgeon: Isaac Dorise POUR, MD;  Location: [facility] OR;  Service: Open Heart Surgery;  Laterality: N/A;   IR FLUORO  GUIDE CV LINE RIGHT  09/27/2021   IR THORACENTESIS ASP PLEURAL SPACE W/IMG GUIDE  09/27/2021   IR US  GUIDE VASC ACCESS RIGHT  09/27/2021   PLACEMENT OF IMPELLA LEFT VENTRICULAR ASSIST DEVICE N/A 09/12/2021   Procedure: PLACEMENT OF IMPELLA 5.5 LEFT VENTRICULAR ASSIST DEVICE;  Surgeon: Isaac Elspeth BROCKS, MD;  Location: [facility] OR;  Service: Open Heart Surgery;  Laterality: N/A;   RIGHT HEART CATH N/A 09/08/2021   Procedure: RIGHT HEART CATH;  Surgeon: Isaac Ezra RAMAN, MD;  Location: [facility] INVASIVE CV LAB;  Service: Cardiovascular;  Laterality: N/A;   TEE WITHOUT CARDIOVERSION N/A 09/12/2021   Procedure: TRANSESOPHAGEAL ECHOCARDIOGRAM (TEE);  Surgeon: Isaac Elspeth BROCKS, MD;  Location: [facility] OR;  Service: Open Heart Surgery;  Laterality: N/A;   TEE WITHOUT CARDIOVERSION N/A 09/17/2021   Procedure: TRANSESOPHAGEAL ECHOCARDIOGRAM (TEE);  Surgeon: Isaac Dorise POUR, MD;  Location: [facility] OR;  Service: Open Heart Surgery;  Laterality: N/A;   Patient Active Problem List   Diagnosis Date Noted   Acute gastric ulcer with hemorrhage 03/29/2024   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   Melena 03/22/2024   ABLA (acute blood loss anemia) 03/22/2024   Anticoagulated 03/22/2024   AVM (arteriovenous malformation) of small bowel, acquired with hemorrhage 03/22/2024   Upper GI bleed 03/21/2024   Hematoma 02/17/2024   Anemia 02/15/2024   Hypotension 02/15/2024   Sundowning 02/11/2024   Insomnia 02/11/2024   Constipation 02/11/2024   Stage 3b chronic kidney disease (HCC) 02/11/2024  Cardioembolic stroke (HCC) 02/10/2024   Acute left PCA stroke (HCC) 02/05/2024   Abnormal thyroid  blood test 04/05/2023   Abscess of left groin 11/27/2022   VT (ventricular tachycardia) (HCC) 10/27/2022   ICD (implantable cardioverter-defibrillator) in place 10/27/2022   Pressure injury of skin 10/07/2021   LVAD (left ventricular assist device) present (HCC)    Protein-calorie malnutrition, severe 09/10/2021   Elevated TSH    PICC (peripherally  inserted central catheter) in place    Cardiogenic shock (HCC)    Syncope 09/07/2021   CHF (congestive heart failure) (HCC) 09/06/2021   CAD (coronary artery disease) 08/22/2021   Cardiomyopathy (HCC) 08/22/2021   Mixed hyperlipidemia 08/22/2021   A-fib (HCC) 08/22/2021   Elevated troponin 08/22/2021   Acute on chronic combined systolic and diastolic CHF (congestive heart failure) (HCC) 08/22/2021   Benign essential hypertension 02/22/2015   Chronic systolic heart failure (HCC) 07/05/2014    ONSET DATE: 02/05/2024; date of referral  02/22/2024  REFERRING DIAG: P36.467 (ICD-10-CM) - Cerebral infarction due to unspecified occlusion or stenosis of left posterior cerebral artery  THERAPY DIAG:  Cognitive communication deficit  Rationale for Evaluation and Treatment Rehabilitation  SUBJECTIVE:   PERTINENT HISTORY and DIAGNOSTIC FINDINGS:  [name] is a 73 y.o. with history of CAD, HTN, atrial fibrillation, CKD IV, VT and chronic systolic CHFs/p HM-3 LVAD on 09/17/21.  He presented to the [facility] ED on 02/05/24 for focal neuro deficits with R vision field cut and dysmetria. Code Stroke activated. CT brain positive for acute to subacute appearing Left PCA and Left cerebellar infarcts. Hyperdense Left PCA suggests occlusion of that vessel. No hemorrhagic transformation or mass effect. Seen by Neuro and decision made to treat with IV heparin . Echocardiogram shows EF 25 to 30% with LVAD at the apex.  Left atrial is moderately dilate. CT angio distal left P2/P3 occlusion.Repeat CT head 02/06/24 with evolving left cerebellar, left PCA and right frontal gyrus MCA branch infarct. No hemorrhage.  Neurology feels infarcts 2/2 LVAD and signed off  Pt attended [facility] from until 02/18/2024, received ST services targeting cognitive linguistic abilities and discharged at MOD A for cognitive linguistic skills. Memory/orientation deficits remain the pt's biggest barrier to progress and  overall safety.   He was admitted on 03/21/2024 from the office for GI bleed with hemoglobin down to 6.4. Coumadin  was held. He was placed on PPI. GI was consulted and he was transfused multiple PRBCs (total of 7 units this admission). He underwent EGD on 7/2 with friable tissue in the duodenum and 4 AVMS which were treated with APC. INR goal was adjusted to 2-2.5. Unfortunately, he continued to have melena and his hemoglobin continued to drop. He underwent repeat EGD + colonoscopy on 7/9  which showed 1 non-bleeding superficial gastric ulcer and 1 clip was placed. Colonoscopy unremarkable. No further bleeding or drop in hemoglobin. INR 1.8 on day of discharge.   PAIN:  Are you having pain? No   FALLS: Has patient fallen in last 6 months?  No  LIVING ENVIRONMENT: Lives with: lives with their spouse Lives in: House/apartment  PLOF:  Level of assistance: Independent with ADLs, Independent with IADLs Employment: Retired   PATIENT GOALS   to improve functional independence  SUBJECTIVE STATEMENT: Pt eager Pt accompanied by: self  OBJECTIVE:  TODAY'S TREATMENT:   Skilled treatment session focused on pt's cognitive communication goals. SLP facilitated session by providing the following interventions:  Pt states that he and his wife have not made an  appt with neuro-ophthalmology - pt he continues to demonstrate increased insight in visual deficits as he commented if my wife doesn't say anything, she can step right in that spot and I won't know she is there - I still have that spot. I lose interest because I can't see it.  To continue assessing pt's cognitive communication abilities the RBANS was administered.   The Repeatable Battery for the Assessment of Neuropsychological Status Update (RBANS-Update) is a brief, individually administered test measuring attention, language, visuospatial/constructional abilities, and immediate and delayed memory. The test comprises 12 subtests that are  intended for use with adolescents to adults, ages 70 to 16 years. Normative scores were developed using a stratified, nationally representative sample of 690 healthy adolescents and adults. RBANS yields index standard scores based on subtest raw scores. RBANS index scores are metrically scaled, with a mean of 100 and a standard deviation of 15 for each age group.    Immediate Memory Index Score: 90: Average (Index Score 90-109)  Total Domain Score List Learning 23/40 Story Memory 15/24  Visuospatial/Constructional Index Score: 58: Extremely Low (Index Score 69 and below)  Total Domain Score Figure Copy 7/20 Line Orientation 9/20   Language Index Score: 85: Low Average (Index Score 80-89)  Total Domain Score Picture Naming 10/10 Semantic Fluency 11/40   Attention Index Score: 91: Average (Index Score 90-109)  Total Domain Score Digit Span 10/16  Coding 29/89   Delayed Memory Index Score: 52: Extremely Low (Index Score 69 and below)  Total Domain Score List Recall 1/10 List Recognition 16/20  Story Recall 2/12 Figure Recall 1/20   Total Scale is 68     PATIENT EDUCATION: Education details: see above Person educated: Patient (pt's wife stayed in lobby with their grandson) Education method: Explanation Education comprehension: verbalized understanding   HOME EXERCISE PROGRAM:   Set alarms on phone [employer] Therapy, see above   GOALS:  Goals reviewed with patient? Yes  SHORT TERM GOALS: Target date: 10 sessions  With Mod A, pt will utilize external memory aids to answer questions regarding orientation information with 80% accuracy.  Baseline: Goal status: INITIAL  2.  With Mod A, pt will improve word finding by using strategies to produce generative naming list of equipment and vocabulary related to LVAD.  Baseline:  Goal status: INITIAL  3.  Pt will complete addition cognitive assessment to establish current level of ability.  Baseline: Pt discharged from  CIR requiring Mod A for basic cognitive function.  Goal status: INITIAL   LONG TERM GOALS: Target date: 05/17/2024  With supervision A, pt will attend to items on his right to improve safety awareness and accuracy of task completion in 5 out of 7 opportunities.  Baseline:  Goal status: INITIAL  2.  With Min A, pt will external and internal memory aides to help short term and prospective memory in 5 out of 7 opportunities.  Baseline:  Goal status: INITIAL  3.  With supervision A, pt will be able to verbally sequence (using appropriate vocabulary) LVAD features, alerts and switching battery sources.  Baseline:  Goal status: INITIAL   ASSESSMENT:  CLINICAL IMPRESSION: Patient is a 73 y.o. male LVAD patient, who was seen today for a cognitive communication treatment d/t left cerebellar, left PCA and right frontal gyrus MCA branch infarct. Pt presents with severe cognitive impairment that is c/b by severe deficits in attention, awareness (pt is aware of difficulty/poor performance within tasks but unaware of overall impairment and impact on safety), profound  memory deficits in immediate recall, short term memory, retrieval, visual, auditory and prospective memory decreased awareness of confabulatory responses and severe deficits in executive function as observed during visuospatial tasks, specifically task planning, organization, sequencing with pt losing points d/t slow task completion. Overall adequate expressive and receptive communication.   Pt presents with improved in immediate memory (now within average limits) and attention (now within the average range), low average language abilities (difficulty with verbal fluency tasks; 11 total fruits and vegetables listed within 1 minute). Extremely low visuospatial abilities (continue to recommend evaluation of any visual field deficits by neuro-ophthalmology) as well as extremely low delayed memory abilities (continue to recommend use of external  memory aids).    Current goals continue to be appropriate with pt demonstrating progress in today's session. Pt continues to be eager and puts forth great effort. See the above treatment note for details.     OBJECTIVE IMPAIRMENTS include attention, memory, awareness, and executive functioning. These impairments are limiting patient from managing medications, managing appointments, managing finances, household responsibilities, and ADLs/IADLs. Factors affecting potential to achieve goals and functional outcome are ability to learn/carryover information, co-morbidities, medical prognosis, previous level of function, and severity of impairments. Patient will benefit from skilled SLP services to address above impairments and improve overall function.  REHAB POTENTIAL: Good  PLAN: SLP FREQUENCY: 1-2x/week  SLP DURATION: 12 weeks  PLANNED INTERVENTIONS: Language facilitation, Environmental controls, Cueing hierachy, Cognitive reorganization, Internal/external aids, Functional tasks, SLP instruction and feedback, Compensatory strategies, and Patient/family education   Ronte Parker B. Rubbie, M.S., CCC-SLP, Tree surgeon Certified Brain Injury Specialist [facility]  [facility] Rehabilitation Services Office [phone] Ascom [phone] Fax [phone]

## 2024-04-25 ENCOUNTER — Ambulatory Visit

## 2024-04-25 ENCOUNTER — Encounter: Admitting: Speech Pathology

## 2024-04-25 ENCOUNTER — Encounter

## 2024-04-26 ENCOUNTER — Other Ambulatory Visit (HOSPITAL_COMMUNITY): Payer: Self-pay | Admitting: *Deleted

## 2024-04-26 DIAGNOSIS — Z95811 Presence of heart assist device: Secondary | ICD-10-CM

## 2024-04-26 DIAGNOSIS — Z7901 Long term (current) use of anticoagulants: Secondary | ICD-10-CM

## 2024-04-26 DIAGNOSIS — I5022 Chronic systolic (congestive) heart failure: Secondary | ICD-10-CM

## 2024-04-27 ENCOUNTER — Encounter

## 2024-04-27 ENCOUNTER — Ambulatory Visit: Admitting: Speech Pathology

## 2024-04-27 ENCOUNTER — Ambulatory Visit

## 2024-04-27 DIAGNOSIS — R41841 Cognitive communication deficit: Secondary | ICD-10-CM | POA: Diagnosis not present

## 2024-04-27 DIAGNOSIS — R278 Other lack of coordination: Secondary | ICD-10-CM

## 2024-04-27 DIAGNOSIS — M6281 Muscle weakness (generalized): Secondary | ICD-10-CM

## 2024-04-27 DIAGNOSIS — R2689 Other abnormalities of gait and mobility: Secondary | ICD-10-CM

## 2024-04-27 DIAGNOSIS — R269 Unspecified abnormalities of gait and mobility: Secondary | ICD-10-CM

## 2024-04-27 DIAGNOSIS — Z95811 Presence of heart assist device: Secondary | ICD-10-CM

## 2024-04-27 NOTE — Therapy (Signed)
 OUTPATIENT PHYSICAL THERAPY TREATMENT   Patient Name: Isaac [name] MRN: [id number] DOB:1950-12-12, 73 y.o., male Today's Date: 04/27/2024   PCP: Valora Agent, MD  REFERRING PROVIDER: Urbano Albright, MD    END OF SESSION:   PT End of Session - 04/27/24 0808     Visit Number 8    Number of Visits 24    Date for PT Re-Evaluation 06/27/24    Progress Note Due on Visit 10    PT Start Time 0805    PT Stop Time 0846    PT Time Calculation (min) 41 min    Equipment Utilized During Treatment Gait belt    Activity Tolerance Patient tolerated treatment well;No increased pain    Behavior During Therapy [facility] for tasks assessed/performed             Past Medical History:  Diagnosis Date   Arrhythmia    atrial fibrillation   CHF (congestive heart failure) (HCC)    Chronic kidney disease    Coronary artery disease    Hyperlipidemia    Hypertension    Myocardial infarct [facility])    Past Surgical History:  Procedure Laterality Date   CARDIAC DEFIBRILLATOR PLACEMENT  feb 2014   COLONOSCOPY N/A 03/29/2024   Procedure: COLONOSCOPY;  Surgeon: Legrand Victory LITTIE DOUGLAS, MD;  Location: [facility] ENDOSCOPY;  Service: Gastroenterology;  Laterality: N/A;   ENTEROSCOPY N/A 03/22/2024   Procedure: ENTEROSCOPY;  Surgeon: Leigh Elspeth SQUIBB, MD;  Location: [facility] ENDOSCOPY;  Service: Gastroenterology;  Laterality: N/A;   ESOPHAGOGASTRODUODENOSCOPY N/A 03/29/2024   Procedure: EGD (ESOPHAGOGASTRODUODENOSCOPY);  Surgeon: Legrand Victory LITTIE DOUGLAS, MD;  Location: [facility] ENDOSCOPY;  Service: Gastroenterology;  Laterality: N/A;   ICD GENERATOR CHANGEOUT N/A 02/09/2024   Procedure: ICD GENERATOR CHANGEOUT;  Surgeon: Waddell Danelle ORN, MD;  Location: [facility] INVASIVE CV LAB;  Service: Cardiovascular;  Laterality: N/A;   INSERTION OF IMPLANTABLE LEFT VENTRICULAR ASSIST DEVICE N/A 09/17/2021   Procedure: INSERTION OF IMPLANTABLE LEFT VENTRICULAR ASSIST DEVICE AND INSERTION OF FEMORAL ARTERIAL LINE;  Surgeon: Lucas Dorise POUR, MD;   Location: [facility] OR;  Service: Open Heart Surgery;  Laterality: N/A;   IR FLUORO GUIDE CV LINE RIGHT  09/27/2021   IR THORACENTESIS ASP PLEURAL SPACE W/IMG GUIDE  09/27/2021   IR US  GUIDE VASC ACCESS RIGHT  09/27/2021   PLACEMENT OF IMPELLA LEFT VENTRICULAR ASSIST DEVICE N/A 09/12/2021   Procedure: PLACEMENT OF IMPELLA 5.5 LEFT VENTRICULAR ASSIST DEVICE;  Surgeon: Kerrin Elspeth BROCKS, MD;  Location: [facility] OR;  Service: Open Heart Surgery;  Laterality: N/A;   RIGHT HEART CATH N/A 09/08/2021   Procedure: RIGHT HEART CATH;  Surgeon: Rolan Ezra RAMAN, MD;  Location: [facility] INVASIVE CV LAB;  Service: Cardiovascular;  Laterality: N/A;   TEE WITHOUT CARDIOVERSION N/A 09/12/2021   Procedure: TRANSESOPHAGEAL ECHOCARDIOGRAM (TEE);  Surgeon: Kerrin Elspeth BROCKS, MD;  Location: [facility] OR;  Service: Open Heart Surgery;  Laterality: N/A;   TEE WITHOUT CARDIOVERSION N/A 09/17/2021   Procedure: TRANSESOPHAGEAL ECHOCARDIOGRAM (TEE);  Surgeon: Lucas Dorise POUR, MD;  Location: [facility] OR;  Service: Open Heart Surgery;  Laterality: N/A;   Patient Active Problem List   Diagnosis Date Noted   Acute gastric ulcer with hemorrhage 03/29/2024   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   Melena 03/22/2024   ABLA (acute blood loss anemia) 03/22/2024   Anticoagulated 03/22/2024   AVM (arteriovenous malformation) of small bowel, acquired with hemorrhage 03/22/2024   Upper GI bleed 03/21/2024   Hematoma 02/17/2024   Anemia 02/15/2024   Hypotension 02/15/2024   Sundowning  02/11/2024   Insomnia 02/11/2024   Constipation 02/11/2024   Stage 3b chronic kidney disease (HCC) 02/11/2024   Cardioembolic stroke (HCC) 02/10/2024   Acute left PCA stroke (HCC) 02/05/2024   Abnormal thyroid  blood test 04/05/2023   Abscess of left groin 11/27/2022   VT (ventricular tachycardia) (HCC) 10/27/2022   ICD (implantable cardioverter-defibrillator) in place 10/27/2022   Pressure injury of skin 10/07/2021   LVAD (left ventricular assist device) present (HCC)     Protein-calorie malnutrition, severe 09/10/2021   Elevated TSH    PICC (peripherally inserted central catheter) in place    Cardiogenic shock (HCC)    Syncope 09/07/2021   CHF (congestive heart failure) (HCC) 09/06/2021   CAD (coronary artery disease) 08/22/2021   Cardiomyopathy (HCC) 08/22/2021   Mixed hyperlipidemia 08/22/2021   A-fib (HCC) 08/22/2021   Elevated troponin 08/22/2021   Acute on chronic combined systolic and diastolic CHF (congestive heart failure) (HCC) 08/22/2021   Benign essential hypertension 02/22/2015   Chronic systolic heart failure (HCC) 07/05/2014    ONSET DATE: 02/05/24  REFERRING DIAG:  P36.467 (ICD-10-CM) - Cerebral infarction due to unspecified occlusion or stenosis of left posterior cerebral artery   THERAPY DIAG:  Muscle weakness (generalized)  Abnormality of gait  Imbalance  Other lack of coordination  LVAD (left ventricular assist device) present [facility])  Rationale for Evaluation and Treatment: Rehabilitation  SUBJECTIVE:                                                                                                                                                                                             SUBJECTIVE STATEMENT:   Patient reports doing well overall- states his biggest complaint is just right LE weakness.    PERTINENT HISTORY:  Presented 02/05/2024 s/p fall, right sided weakness and aphasia with blurred vision. CT positive for acute  left PCA left cerebellar infarction. Admitted to  rehab 02/10/2024 for inpatient therapies to consist of PT, ST and OT at least three hours five days a week. Started OPPT here, for 7 visits, then admitted to [facility] for GIB 03/21/24. PMH:  Atrial fibrillation/VT chronic systolic congestive heart failure status post LVAD 09/17/2021.   PAIN:  Are you having pain? No  PRECAUTIONS: ICD/Pacemaker and Other: LVAD  WEIGHT BEARING RESTRICTIONS: None  FALLS: Has patient fallen in last 6 months? No  LIVING  ENVIRONMENT: Lives with: lives with their spouse Stevie) Lives in: House/apartment Stairs: Yes: Internal: 9 steps; on left going up Has following equipment at home: Single point cane, Walker - 2 wheeled, and does not use  PLOF: Independent with basic ADLs and Independent with household mobility without  device, driving  PATIENT GOALS: pt reports the sight in R visual field is limited. Improve strength in R LE primarily. Improve balance slightly.   OBJECTIVE:  Note: Objective measures were completed at Evaluation unless otherwise noted.    GAIT: Findings: Gait Characteristics: occasional decreased R foot clearance during swing, step through pattern, decreased arm swing- Right, decreased arm swing- Left, and decreased stance time- Right, Distance walked: 60, Assistive device utilized:None, and Level of assistance: Modified independence and SBA  FUNCTIONAL TESTS:  5 times sit to stand: 15.92 seconds 6 minute walk test: 1284 feet (391 meters, Avg speed 1.44m/s), no AD 10 meter walk test: need to assess FGA: need to assess Mini-Best Test: need to assess  PATIENT SURVEYS:  Stroke Impact Scale 72%                                                                                                                              TREATMENT DATE: 04/27/2024     NMR:  -Dynamic High knee march in place on airex pad - 20 reps (unsteady with decreased time on RLE)   - Dynamic standing (front foot on 6 step and back foot on airex pad) - static standing x 30 sec x 2. Added dynamic head turns x 10 eyes open then 10 more eyes closed- then switched feet and repeated all above. (Much more unsteady with eyes closed)   Basketball shooting activity- Shooting from 6 feet away while standing on airex pad in varying feet position: Wide, narrowed, staggered ea direction, tandem ea direction x approx 10 shots per foot position- Very challenging with staggered and tandem- several LOB- quick self recovery.    Side  step up and over 6 block with airex pad on top without UE support x 12 reps ea direction  TA:   Sit to stand with airex pad under foot x 12 reps (patient reported as fatiguing)    PATIENT EDUCATION: Education details: therapy POC, findings from assessment, continuation of prior HEP Person educated: Patient Education method: Explanation Education comprehension: verbalized understanding and needs further education  HOME EXERCISE PROGRAM: Access Code: [email or web address] URL: [email or web address] Date: 04/10/2024 Prepared by: Peggye Linear  Exercises - Tandem Stance with Support  - 1 x daily - 4 x weekly - 3 sets - 4 reps - 20 seconds  hold - Forward and Backward Walking with Eyes Closed and Counter Support  - 1 x daily - 4 x weekly - 3 sets - 5 reps - Side Stepping with Resistance at Thighs and Counter Support  - 1 x daily - 7 x weekly - 2 sets - 10 reps - Forward Step Up with Unilateral Counter Support  - 1 x daily - 7 x weekly - 1-2 sets - 10 reps - Walking  - 3 x daily - 7 x weekly - 1 sets - 6 minutes hold - Sit to Stand with Arms Crossed  - 3 x daily - 7 x weekly -  1 sets - 10 reps  GOALS: Goals reviewed with patient? Yes SHORT TERM GOALS: Target date: 05/16/2024  Patient will be independent in home exercise program to improve strength/mobility for better functional independence with ADLs. Baseline: carried forward prior HEP, will benefit form review and progression Goal status: INITIAL  LONG TERM GOALS: Target date: 06/27/2024 Patient will increase SIS 16 score to equal to or greater than 10 points to demonstrate statistically significant improvement in mobility and quality of life.  Baseline: 72% Goal status: INITIAL  2.  Patient (> 18 years old) will complete five times sit to stand test in < 15 seconds indicating an increased LE strength and improved balance. Baseline:  15.92 seconds Goal status: INITIAL  3.  Patient will increase MiniBest Test score by at least  5 more points to indicate a reduced risk for falling and demonstrate increased independence with functional mobility and ADLs.  Baseline: need to assess; 04/06/24: 21/28 Goal status: ON GOING  4.  Patient will increase FGA  score to >24/30 as to demonstrate reduced fall risk and improved dynamic gait balance for better safety with community/home ambulation. Baseline: need to assess; 04/06/24: 26/30 Goal status: MET  5.  Patient will increase 6 min walk test to >1481ft to demonstrate decreased fall risk during functional activities as well as improved access to community mobility.  Baseline: 1284 feet (391 meters, Avg speed 1.60m/s), no AD Goal status: INITIAL  ASSESSMENT:  CLINICAL IMPRESSION:   Continued per plan with dynamic balance activities. Patient was challenged with all airex pad activities mostly able to correct with ankle righting reaction and any eyes closed activity. He continues to work hard and able to demonstrate some in session progress with improved steadiness and adaptation to tasks - min VC to correct techniques today. Mild increased difficulty with SLS activities on RLE.   Patient will benefit from skilled physical therapy intervention to reduce deficits and impairments identified in evaluation, in order to reduce pain, improve quality of life, and maximize activity tolerance for ADL, IADL, and leisure/fitness. Physical therapy will help pt achieve long and short term goals of care.   OBJECTIVE IMPAIRMENTS: Abnormal gait, cardiopulmonary status limiting activity, decreased activity tolerance, decreased balance, decreased coordination, decreased endurance, decreased mobility, difficulty walking, decreased strength, and improper body mechanics.   ACTIVITY LIMITATIONS: carrying, lifting, squatting, stairs, locomotion level, and caring for others  PARTICIPATION LIMITATIONS: cleaning, laundry, driving, shopping, community activity, occupation, and yard work  PERSONAL FACTORS: Age  and 3+ comorbidities: LVAD, Pacemaker, hypotension, Stage 3b CKD  are also affecting patient's functional outcome.   REHAB POTENTIAL: Excellent  CLINICAL DECISION MAKING: Evolving/moderate complexity  EVALUATION COMPLEXITY: Moderate  PLAN:  PT FREQUENCY: 1-2x/week  PT DURATION: 12 weeks  PLANNED INTERVENTIONS: 97164- PT Re-evaluation, 97750- Physical Performance Testing, 97110-Therapeutic exercises, 97530- Therapeutic activity, 97112- Neuromuscular re-education, 97535- Self Care, [id number]- Manual therapy, [phone]- Gait training, [phone]- Canalith repositioning, Patient/Family education, Balance training, Stair training, Taping, Vestibular training, DME instructions, Cryotherapy, Moist heat, and Biofeedback   PLAN FOR NEXT SESSION:  Continue to assess HEP  Dynamic balance and mobility   Chyrl London, PT Physical Therapist - [facility]  [facility]  11:35 AM 04/27/24

## 2024-04-27 NOTE — Therapy (Signed)
 OUTPATIENT SPEECH LANGUAGE PATHOLOGY  COGNITIVE COMMUNICATION TREATMENT NOTE   Patient Name: Isaac [name] MRN: [id number] DOB:Jun 24, 1951, 73 y.o., male Today's Date: 04/27/2024  PCP: Isaac Null, MD REFERRING PROVIDER: Toribio Pitch, PA   End of Session - 04/27/24 0849     Visit Number 15    Number of Visits 25    Date for SLP Re-Evaluation 05/17/24    Authorization Type [employer] Medicare HMO/PPO    Progress Note Due on Visit 20    SLP Start Time 0845    SLP Stop Time  0930    SLP Time Calculation (min) 45 min    Activity Tolerance Patient tolerated treatment well          Past Medical History:  Diagnosis Date   Arrhythmia    atrial fibrillation   CHF (congestive heart failure) (HCC)    Chronic kidney disease    Coronary artery disease    Hyperlipidemia    Hypertension    Myocardial infarct [facility])    Past Surgical History:  Procedure Laterality Date   CARDIAC DEFIBRILLATOR PLACEMENT  feb 2014   COLONOSCOPY N/A 03/29/2024   Procedure: COLONOSCOPY;  Surgeon: Isaac Victory LITTIE DOUGLAS, MD;  Location: [facility] ENDOSCOPY;  Service: Gastroenterology;  Laterality: N/A;   ENTEROSCOPY N/A 03/22/2024   Procedure: ENTEROSCOPY;  Surgeon: Isaac Elspeth SQUIBB, MD;  Location: [facility] ENDOSCOPY;  Service: Gastroenterology;  Laterality: N/A;   ESOPHAGOGASTRODUODENOSCOPY N/A 03/29/2024   Procedure: EGD (ESOPHAGOGASTRODUODENOSCOPY);  Surgeon: Isaac Victory LITTIE DOUGLAS, MD;  Location: [facility] ENDOSCOPY;  Service: Gastroenterology;  Laterality: N/A;   ICD GENERATOR CHANGEOUT N/A 02/09/2024   Procedure: ICD GENERATOR CHANGEOUT;  Surgeon: Isaac Danelle ORN, MD;  Location: [facility] INVASIVE CV LAB;  Service: Cardiovascular;  Laterality: N/A;   INSERTION OF IMPLANTABLE LEFT VENTRICULAR ASSIST DEVICE N/A 09/17/2021   Procedure: INSERTION OF IMPLANTABLE LEFT VENTRICULAR ASSIST DEVICE AND INSERTION OF FEMORAL ARTERIAL LINE;  Surgeon: Isaac Dorise POUR, MD;  Location: [facility] OR;  Service: Open Heart Surgery;  Laterality: N/A;   IR FLUORO  GUIDE CV LINE RIGHT  09/27/2021   IR THORACENTESIS ASP PLEURAL SPACE W/IMG GUIDE  09/27/2021   IR US  GUIDE VASC ACCESS RIGHT  09/27/2021   PLACEMENT OF IMPELLA LEFT VENTRICULAR ASSIST DEVICE N/A 09/12/2021   Procedure: PLACEMENT OF IMPELLA 5.5 LEFT VENTRICULAR ASSIST DEVICE;  Surgeon: Isaac Elspeth BROCKS, MD;  Location: [facility] OR;  Service: Open Heart Surgery;  Laterality: N/A;   RIGHT HEART CATH N/A 09/08/2021   Procedure: RIGHT HEART CATH;  Surgeon: Isaac Ezra RAMAN, MD;  Location: [facility] INVASIVE CV LAB;  Service: Cardiovascular;  Laterality: N/A;   TEE WITHOUT CARDIOVERSION N/A 09/12/2021   Procedure: TRANSESOPHAGEAL ECHOCARDIOGRAM (TEE);  Surgeon: Isaac Elspeth BROCKS, MD;  Location: [facility] OR;  Service: Open Heart Surgery;  Laterality: N/A;   TEE WITHOUT CARDIOVERSION N/A 09/17/2021   Procedure: TRANSESOPHAGEAL ECHOCARDIOGRAM (TEE);  Surgeon: Isaac Dorise POUR, MD;  Location: [facility] OR;  Service: Open Heart Surgery;  Laterality: N/A;   Patient Active Problem List   Diagnosis Date Noted   Acute gastric ulcer with hemorrhage 03/29/2024   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   Melena 03/22/2024   ABLA (acute blood loss anemia) 03/22/2024   Anticoagulated 03/22/2024   AVM (arteriovenous malformation) of small bowel, acquired with hemorrhage 03/22/2024   Upper GI bleed 03/21/2024   Hematoma 02/17/2024   Anemia 02/15/2024   Hypotension 02/15/2024   Sundowning 02/11/2024   Insomnia 02/11/2024   Constipation 02/11/2024   Stage 3b chronic kidney disease (HCC) 02/11/2024  Cardioembolic stroke (HCC) 02/10/2024   Acute left PCA stroke (HCC) 02/05/2024   Abnormal thyroid  blood test 04/05/2023   Abscess of left groin 11/27/2022   VT (ventricular tachycardia) (HCC) 10/27/2022   ICD (implantable cardioverter-defibrillator) in place 10/27/2022   Pressure injury of skin 10/07/2021   LVAD (left ventricular assist device) present (HCC)    Protein-calorie malnutrition, severe 09/10/2021   Elevated TSH    PICC (peripherally  inserted central catheter) in place    Cardiogenic shock (HCC)    Syncope 09/07/2021   CHF (congestive heart failure) (HCC) 09/06/2021   CAD (coronary artery disease) 08/22/2021   Cardiomyopathy (HCC) 08/22/2021   Mixed hyperlipidemia 08/22/2021   A-fib (HCC) 08/22/2021   Elevated troponin 08/22/2021   Acute on chronic combined systolic and diastolic CHF (congestive heart failure) (HCC) 08/22/2021   Benign essential hypertension 02/22/2015   Chronic systolic heart failure (HCC) 07/05/2014    ONSET DATE: 02/05/2024; date of referral  02/22/2024  REFERRING DIAG: P36.467 (ICD-10-CM) - Cerebral infarction due to unspecified occlusion or stenosis of left posterior cerebral artery  THERAPY DIAG:  Cognitive communication deficit  Rationale for Evaluation and Treatment Rehabilitation  SUBJECTIVE:   PERTINENT HISTORY and DIAGNOSTIC FINDINGS:  Mr. Isaac [name] is a 73 y.o. with history of CAD, HTN, atrial fibrillation, CKD IV, VT and chronic systolic CHFs/p HM-3 LVAD on 09/17/21.  He presented to the [facility] ED on 02/05/24 for focal neuro deficits with R vision field cut and dysmetria. Code Stroke activated. CT brain positive for acute to subacute appearing Left PCA and Left cerebellar infarcts. Hyperdense Left PCA suggests occlusion of that vessel. No hemorrhagic transformation or mass effect. Seen by Neuro and decision made to treat with IV heparin . Echocardiogram shows EF 25 to 30% with LVAD at the apex.  Left atrial is moderately dilate. CT angio distal left P2/P3 occlusion.Repeat CT head 02/06/24 with evolving left cerebellar, left PCA and right frontal gyrus MCA branch infarct. No hemorrhage.  Neurology feels infarcts 2/2 LVAD and signed off  Pt attended [facility] CIR from until 02/18/2024, received ST services targeting cognitive linguistic abilities and discharged at MOD A for cognitive linguistic skills. Memory/orientation deficits remain the pt's biggest barrier to progress and  overall safety.   He was admitted on 03/21/2024 from the office for GI bleed with hemoglobin down to 6.4. Coumadin  was held. He was placed on PPI. GI was consulted and he was transfused multiple PRBCs (total of 7 units this admission). He underwent EGD on 7/2 with friable tissue in the duodenum and 4 AVMS which were treated with APC. INR goal was adjusted to 2-2.5. Unfortunately, he continued to have melena and his hemoglobin continued to drop. He underwent repeat EGD + colonoscopy on 7/9  which showed 1 non-bleeding superficial gastric ulcer and 1 clip was placed. Colonoscopy unremarkable. No further bleeding or drop in hemoglobin. INR 1.8 on day of discharge.   PAIN:  Are you having pain? No   FALLS: Has patient fallen in last 6 months?  No  LIVING ENVIRONMENT: Lives with: lives with their spouse Lives in: House/apartment  PLOF:  Level of assistance: Independent with ADLs, Independent with IADLs Employment: Retired   PATIENT GOALS   to improve functional independence  SUBJECTIVE STATEMENT: Pt eager Pt accompanied by: self  OBJECTIVE:  TODAY'S TREATMENT:   Skilled treatment session focused on pt's cognitive communication goals. SLP facilitated session by providing the following interventions:  Pt states that he and his wife have made an appt  with eye doctor.  This Clinical research associate reviewed results of RBANS as well as areas of improvement with pt and his wife. In addition to areas of improvement, pt and his were able to identify several areas of need to continue to target such as medication management   PATIENT EDUCATION: Education details: see above Person educated: Patient  Education method: Explanation Education comprehension: verbalized understanding   HOME EXERCISE PROGRAM:   Set alarms on phone [employer] Therapy, see above   GOALS:  Goals reviewed with patient? Yes  SHORT TERM GOALS: Target date: 10 sessions  With Mod A, pt will utilize external memory aids to answer  questions regarding orientation information with 80% accuracy.  Baseline: Goal status: INITIAL  2.  With Mod A, pt will improve word finding by using strategies to produce generative naming list of equipment and vocabulary related to LVAD.  Baseline:  Goal status: INITIAL  3.  Pt will complete addition cognitive assessment to establish current level of ability.  Baseline: Pt discharged from CIR requiring Mod A for basic cognitive function.  Goal status: INITIAL   LONG TERM GOALS: Target date: 05/17/2024  With supervision A, pt will attend to items on his right to improve safety awareness and accuracy of task completion in 5 out of 7 opportunities.  Baseline:  Goal status: INITIAL  2.  With Min A, pt will external and internal memory aides to help short term and prospective memory in 5 out of 7 opportunities.  Baseline:  Goal status: INITIAL  3.  With supervision A, pt will be able to verbally sequence (using appropriate vocabulary) LVAD features, alerts and switching battery sources.  Baseline:  Goal status: INITIAL   ASSESSMENT:  CLINICAL IMPRESSION: Patient is a 73 y.o. male LVAD patient, who was seen today for a cognitive communication treatment d/t left cerebellar, left PCA and right frontal gyrus MCA branch infarct. Pt presents with severe cognitive impairment that is c/b by severe deficits in attention, awareness (pt is aware of difficulty/poor performance within tasks but unaware of overall impairment and impact on safety), profound memory deficits in immediate recall, short term memory, retrieval, visual, auditory and prospective memory decreased awareness of confabulatory responses and severe deficits in executive function as observed during visuospatial tasks, specifically task planning, organization, sequencing with pt losing points d/t slow task completion. Overall adequate expressive and receptive communication.   Pt presents with improved in immediate memory (now within  average limits) and attention (now within the average range), low average language abilities (difficulty with verbal fluency tasks; 11 total fruits and vegetables listed within 1 minute). Extremely low visuospatial abilities (continue to recommend evaluation of any visual field deficits by neuro-ophthalmology) as well as extremely low delayed memory abilities (continue to recommend use of external memory aids).    Current goals continue to be appropriate with pt demonstrating progress in today's session. Pt continues to be eager and puts forth great effort. See the above treatment note for details.     OBJECTIVE IMPAIRMENTS include attention, memory, awareness, and executive functioning. These impairments are limiting patient from managing medications, managing appointments, managing finances, household responsibilities, and ADLs/IADLs. Factors affecting potential to achieve goals and functional outcome are ability to learn/carryover information, co-morbidities, medical prognosis, previous level of function, and severity of impairments. Patient will benefit from skilled SLP services to address above impairments and improve overall function.  REHAB POTENTIAL: Good  PLAN: SLP FREQUENCY: 1-2x/week  SLP DURATION: 12 weeks  PLANNED INTERVENTIONS: Language facilitation, Environmental controls, Cueing hierachy,  Cognitive reorganization, Internal/external aids, Functional tasks, SLP instruction and feedback, Compensatory strategies, and Patient/family education   Shabre Kreher B. Rubbie, M.S., CCC-SLP, Tree surgeon Certified Brain Injury Specialist [facility]  [facility] Rehabilitation Services Office [phone] Ascom [phone] Fax [phone]

## 2024-04-28 ENCOUNTER — Ambulatory Visit (HOSPITAL_COMMUNITY): Payer: Self-pay | Admitting: Pharmacist

## 2024-04-28 LAB — POCT INR: INR: 1.9 — AB (ref 2.0–3.0)

## 2024-05-01 ENCOUNTER — Ambulatory Visit: Admitting: Speech Pathology

## 2024-05-01 ENCOUNTER — Ambulatory Visit: Admitting: Physical Therapy

## 2024-05-01 ENCOUNTER — Other Ambulatory Visit (HOSPITAL_COMMUNITY): Payer: Self-pay

## 2024-05-01 DIAGNOSIS — R269 Unspecified abnormalities of gait and mobility: Secondary | ICD-10-CM

## 2024-05-01 DIAGNOSIS — Z95811 Presence of heart assist device: Secondary | ICD-10-CM

## 2024-05-01 DIAGNOSIS — R2689 Other abnormalities of gait and mobility: Secondary | ICD-10-CM

## 2024-05-01 DIAGNOSIS — M6281 Muscle weakness (generalized): Secondary | ICD-10-CM

## 2024-05-01 DIAGNOSIS — R41841 Cognitive communication deficit: Secondary | ICD-10-CM

## 2024-05-01 DIAGNOSIS — I5022 Chronic systolic (congestive) heart failure: Secondary | ICD-10-CM

## 2024-05-01 NOTE — Therapy (Signed)
 OUTPATIENT SPEECH LANGUAGE PATHOLOGY  COGNITIVE COMMUNICATION TREATMENT NOTE   Patient Name: Isaac [name] MRN: [id number] DOB:01-03-1951, 73 y.o., male Today's Date: 05/01/2024  PCP: Lynwood Null, MD REFERRING PROVIDER: Toribio Pitch, PA   End of Session - 05/01/24 0920     Visit Number 16    Number of Visits 25    Date for SLP Re-Evaluation 05/17/24    Authorization Type [employer] Medicare HMO/PPO    Progress Note Due on Visit 20    SLP Start Time 0930    SLP Stop Time  1015    SLP Time Calculation (min) 45 min    Activity Tolerance Patient tolerated treatment well          Past Medical History:  Diagnosis Date   Arrhythmia    atrial fibrillation   CHF (congestive heart failure) (HCC)    Chronic kidney disease    Coronary artery disease    Hyperlipidemia    Hypertension    Myocardial infarct [facility])    Past Surgical History:  Procedure Laterality Date   CARDIAC DEFIBRILLATOR PLACEMENT  feb 2014   COLONOSCOPY N/A 03/29/2024   Procedure: COLONOSCOPY;  Surgeon: Legrand Victory LITTIE DOUGLAS, MD;  Location: [facility] ENDOSCOPY;  Service: Gastroenterology;  Laterality: N/A;   ENTEROSCOPY N/A 03/22/2024   Procedure: ENTEROSCOPY;  Surgeon: Leigh Elspeth SQUIBB, MD;  Location: [facility] ENDOSCOPY;  Service: Gastroenterology;  Laterality: N/A;   ESOPHAGOGASTRODUODENOSCOPY N/A 03/29/2024   Procedure: EGD (ESOPHAGOGASTRODUODENOSCOPY);  Surgeon: Legrand Victory LITTIE DOUGLAS, MD;  Location: [facility] ENDOSCOPY;  Service: Gastroenterology;  Laterality: N/A;   ICD GENERATOR CHANGEOUT N/A 02/09/2024   Procedure: ICD GENERATOR CHANGEOUT;  Surgeon: Waddell Danelle ORN, MD;  Location: [facility] INVASIVE CV LAB;  Service: Cardiovascular;  Laterality: N/A;   INSERTION OF IMPLANTABLE LEFT VENTRICULAR ASSIST DEVICE N/A 09/17/2021   Procedure: INSERTION OF IMPLANTABLE LEFT VENTRICULAR ASSIST DEVICE AND INSERTION OF FEMORAL ARTERIAL LINE;  Surgeon: Lucas Dorise POUR, MD;  Location: [facility] OR;  Service: Open Heart Surgery;  Laterality: N/A;   IR FLUORO  GUIDE CV LINE RIGHT  09/27/2021   IR THORACENTESIS ASP PLEURAL SPACE W/IMG GUIDE  09/27/2021   IR US  GUIDE VASC ACCESS RIGHT  09/27/2021   PLACEMENT OF IMPELLA LEFT VENTRICULAR ASSIST DEVICE N/A 09/12/2021   Procedure: PLACEMENT OF IMPELLA 5.5 LEFT VENTRICULAR ASSIST DEVICE;  Surgeon: Kerrin Elspeth BROCKS, MD;  Location: [facility] OR;  Service: Open Heart Surgery;  Laterality: N/A;   RIGHT HEART CATH N/A 09/08/2021   Procedure: RIGHT HEART CATH;  Surgeon: Rolan Ezra RAMAN, MD;  Location: [facility] INVASIVE CV LAB;  Service: Cardiovascular;  Laterality: N/A;   TEE WITHOUT CARDIOVERSION N/A 09/12/2021   Procedure: TRANSESOPHAGEAL ECHOCARDIOGRAM (TEE);  Surgeon: Kerrin Elspeth BROCKS, MD;  Location: [facility] OR;  Service: Open Heart Surgery;  Laterality: N/A;   TEE WITHOUT CARDIOVERSION N/A 09/17/2021   Procedure: TRANSESOPHAGEAL ECHOCARDIOGRAM (TEE);  Surgeon: Lucas Dorise POUR, MD;  Location: [facility] OR;  Service: Open Heart Surgery;  Laterality: N/A;   Patient Active Problem List   Diagnosis Date Noted   Acute gastric ulcer with hemorrhage 03/29/2024   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   Melena 03/22/2024   ABLA (acute blood loss anemia) 03/22/2024   Anticoagulated 03/22/2024   AVM (arteriovenous malformation) of small bowel, acquired with hemorrhage 03/22/2024   Upper GI bleed 03/21/2024   Hematoma 02/17/2024   Anemia 02/15/2024   Hypotension 02/15/2024   Sundowning 02/11/2024   Insomnia 02/11/2024   Constipation 02/11/2024   Stage 3b chronic kidney disease (HCC) 02/11/2024  Cardioembolic stroke (HCC) 02/10/2024   Acute left PCA stroke (HCC) 02/05/2024   Abnormal thyroid  blood test 04/05/2023   Abscess of left groin 11/27/2022   VT (ventricular tachycardia) (HCC) 10/27/2022   ICD (implantable cardioverter-defibrillator) in place 10/27/2022   Pressure injury of skin 10/07/2021   LVAD (left ventricular assist device) present (HCC)    Protein-calorie malnutrition, severe 09/10/2021   Elevated TSH    PICC (peripherally  inserted central catheter) in place    Cardiogenic shock (HCC)    Syncope 09/07/2021   CHF (congestive heart failure) (HCC) 09/06/2021   CAD (coronary artery disease) 08/22/2021   Cardiomyopathy (HCC) 08/22/2021   Mixed hyperlipidemia 08/22/2021   A-fib (HCC) 08/22/2021   Elevated troponin 08/22/2021   Acute on chronic combined systolic and diastolic CHF (congestive heart failure) (HCC) 08/22/2021   Benign essential hypertension 02/22/2015   Chronic systolic heart failure (HCC) 07/05/2014    ONSET DATE: 02/05/2024; date of referral  02/22/2024  REFERRING DIAG: P36.467 (ICD-10-CM) - Cerebral infarction due to unspecified occlusion or stenosis of left posterior cerebral artery  THERAPY DIAG:  Cognitive communication deficit  Rationale for Evaluation and Treatment Rehabilitation  SUBJECTIVE:   PERTINENT HISTORY and DIAGNOSTIC FINDINGS:  Isaac [name] is a 73 y.o. with history of CAD, HTN, atrial fibrillation, CKD IV, VT and chronic systolic CHFs/p HM-3 LVAD on 09/17/21.  He presented to the [facility] ED on 02/05/24 for focal neuro deficits with R vision field cut and dysmetria. Code Stroke activated. CT brain positive for acute to subacute appearing Left PCA and Left cerebellar infarcts. Hyperdense Left PCA suggests occlusion of that vessel. No hemorrhagic transformation or mass effect. Seen by Neuro and decision made to treat with IV heparin . Echocardiogram shows EF 25 to 30% with LVAD at the apex.  Left atrial is moderately dilate. CT angio distal left P2/P3 occlusion.Repeat CT head 02/06/24 with evolving left cerebellar, left PCA and right frontal gyrus MCA branch infarct. No hemorrhage.  Neurology feels infarcts 2/2 LVAD and signed off  Pt attended [facility] CIR from until 02/18/2024, received ST services targeting cognitive linguistic abilities and discharged at MOD A for cognitive linguistic skills. Memory/orientation deficits remain the pt's biggest barrier to progress and  overall safety.   He was admitted on 03/21/2024 from the office for GI bleed with hemoglobin down to 6.4. Coumadin  was held. He was placed on PPI. GI was consulted and he was transfused multiple PRBCs (total of 7 units this admission). He underwent EGD on 7/2 with friable tissue in the duodenum and 4 AVMS which were treated with APC. INR goal was adjusted to 2-2.5. Unfortunately, he continued to have melena and his hemoglobin continued to drop. He underwent repeat EGD + colonoscopy on 7/9  which showed 1 non-bleeding superficial gastric ulcer and 1 clip was placed. Colonoscopy unremarkable. No further bleeding or drop in hemoglobin. INR 1.8 on day of discharge.   PAIN:  Are you having pain? No   FALLS: Has patient fallen in last 6 months?  No  LIVING ENVIRONMENT: Lives with: lives with their spouse Lives in: House/apartment  PLOF:  Level of assistance: Independent with ADLs, Independent with IADLs Employment: Retired   PATIENT GOALS   to improve functional independence  SUBJECTIVE STATEMENT: Pt eager Pt accompanied by: self  OBJECTIVE:  TODAY'S TREATMENT:   Skilled treatment session focused on pt's cognitive communication goals. SLP facilitated session by providing the following interventions:  Medication management - per pt and his wife, his wife continues  to organize a TID pill organizer for pt. They identify medication management as an area they would like for pt to be more independent in- during this session, pt required maximal faded to moderate verbal and visual cues to demonstrate understanding of completing for entire week and identifying morning vs evening. With rare Min A verbal cues, pt able to read printed medication label with > 95% accuracy (as a visual scanning task) and with moderate verbal cues, able to organize pill box accordingly. Pt with improved recall of names of his medicines as well. Pt will bring in his medicines for next session to increase functionality of  task.   PATIENT EDUCATION: Education details: see above Person educated: Patient  Education method: Explanation Education comprehension: verbalized understanding   HOME EXERCISE PROGRAM:   Set alarms on phone [employer] Therapy, see above   GOALS:  Goals reviewed with patient? Yes  SHORT TERM GOALS: Target date: 10 sessions  With Mod A, pt will utilize external memory aids to answer questions regarding orientation information with 80% accuracy.  Baseline: Goal status: INITIAL  2.  With Mod A, pt will improve word finding by using strategies to produce generative naming list of equipment and vocabulary related to LVAD.  Baseline:  Goal status: INITIAL  3.  Pt will complete addition cognitive assessment to establish current level of ability.  Baseline: Pt discharged from CIR requiring Mod A for basic cognitive function.  Goal status: INITIAL   LONG TERM GOALS: Target date: 05/17/2024  With supervision A, pt will attend to items on his right to improve safety awareness and accuracy of task completion in 5 out of 7 opportunities.  Baseline:  Goal status: INITIAL  2.  With Min A, pt will external and internal memory aides to help short term and prospective memory in 5 out of 7 opportunities.  Baseline:  Goal status: INITIAL  3.  With supervision A, pt will be able to verbally sequence (using appropriate vocabulary) LVAD features, alerts and switching battery sources.  Baseline:  Goal status: INITIAL   ASSESSMENT:  CLINICAL IMPRESSION: Patient is a 73 y.o. male LVAD patient, who was seen today for a cognitive communication treatment d/t left cerebellar, left PCA and right frontal gyrus MCA branch infarct. Pt presents with severe cognitive impairment that is c/b by severe deficits in attention, awareness (pt is aware of difficulty/poor performance within tasks but unaware of overall impairment and impact on safety), profound memory deficits in immediate recall, short term  memory, retrieval, visual, auditory and prospective memory decreased awareness of confabulatory responses and severe deficits in executive function as observed during visuospatial tasks, specifically task planning, organization, sequencing with pt losing points d/t slow task completion. Overall adequate expressive and receptive communication.   Pt presents with improved in immediate memory (now within average limits) and attention (now within the average range), low average language abilities (difficulty with verbal fluency tasks; 11 total fruits and vegetables listed within 1 minute). Extremely low visuospatial abilities (continue to recommend evaluation of any visual field deficits by neuro-ophthalmology) as well as extremely low delayed memory abilities (continue to recommend use of external memory aids).    Current goals continue to be appropriate with pt demonstrating progress in today's session. Pt continues to be eager and puts forth great effort. See the above treatment note for details.     OBJECTIVE IMPAIRMENTS include attention, memory, awareness, and executive functioning. These impairments are limiting patient from managing medications, managing appointments, managing finances, household responsibilities, and ADLs/IADLs.  Factors affecting potential to achieve goals and functional outcome are ability to learn/carryover information, co-morbidities, medical prognosis, previous level of function, and severity of impairments. Patient will benefit from skilled SLP services to address above impairments and improve overall function.  REHAB POTENTIAL: Good  PLAN: SLP FREQUENCY: 1-2x/week  SLP DURATION: 12 weeks  PLANNED INTERVENTIONS: Language facilitation, Environmental controls, Cueing hierachy, Cognitive reorganization, Internal/external aids, Functional tasks, SLP instruction and feedback, Compensatory strategies, and Patient/family education   Isaac [name], M.S., CCC-SLP,  Tree surgeon Certified Brain Injury Specialist [facility]  [facility] Rehabilitation Services Office [phone] Ascom [phone] Fax [phone]

## 2024-05-01 NOTE — Therapy (Signed)
 OUTPATIENT PHYSICAL THERAPY TREATMENT   Patient Name: Isaac [name] MRN: [id number] DOB:09/15/1951, 73 y.o., male Today's Date: 05/01/2024   PCP: Valora Agent, MD  REFERRING PROVIDER: Urbano Albright, MD    END OF SESSION:   PT End of Session - 05/01/24 1038     Visit Number 9    Number of Visits 24    Date for PT Re-Evaluation 06/27/24    Progress Note Due on Visit 10    PT Start Time 0851 (P)     PT Stop Time 0929 (P)     PT Time Calculation (min) 38 min (P)     Equipment Utilized During Treatment Gait belt    Activity Tolerance Patient tolerated treatment well;No increased pain    Behavior During Therapy [facility] for tasks assessed/performed              Past Medical History:  Diagnosis Date   Arrhythmia    atrial fibrillation   CHF (congestive heart failure) (HCC)    Chronic kidney disease    Coronary artery disease    Hyperlipidemia    Hypertension    Myocardial infarct [facility])    Past Surgical History:  Procedure Laterality Date   CARDIAC DEFIBRILLATOR PLACEMENT  feb 2014   COLONOSCOPY N/A 03/29/2024   Procedure: COLONOSCOPY;  Surgeon: Legrand Victory LITTIE DOUGLAS, MD;  Location: [facility] ENDOSCOPY;  Service: Gastroenterology;  Laterality: N/A;   ENTEROSCOPY N/A 03/22/2024   Procedure: ENTEROSCOPY;  Surgeon: Leigh Elspeth SQUIBB, MD;  Location: [facility] ENDOSCOPY;  Service: Gastroenterology;  Laterality: N/A;   ESOPHAGOGASTRODUODENOSCOPY N/A 03/29/2024   Procedure: EGD (ESOPHAGOGASTRODUODENOSCOPY);  Surgeon: Legrand Victory LITTIE DOUGLAS, MD;  Location: [facility] ENDOSCOPY;  Service: Gastroenterology;  Laterality: N/A;   ICD GENERATOR CHANGEOUT N/A 02/09/2024   Procedure: ICD GENERATOR CHANGEOUT;  Surgeon: Waddell Danelle ORN, MD;  Location: [facility] INVASIVE CV LAB;  Service: Cardiovascular;  Laterality: N/A;   INSERTION OF IMPLANTABLE LEFT VENTRICULAR ASSIST DEVICE N/A 09/17/2021   Procedure: INSERTION OF IMPLANTABLE LEFT VENTRICULAR ASSIST DEVICE AND INSERTION OF FEMORAL ARTERIAL LINE;  Surgeon: Lucas Dorise POUR, MD;  Location: [facility] OR;  Service: Open Heart Surgery;  Laterality: N/A;   IR FLUORO GUIDE CV LINE RIGHT  09/27/2021   IR THORACENTESIS ASP PLEURAL SPACE W/IMG GUIDE  09/27/2021   IR US  GUIDE VASC ACCESS RIGHT  09/27/2021   PLACEMENT OF IMPELLA LEFT VENTRICULAR ASSIST DEVICE N/A 09/12/2021   Procedure: PLACEMENT OF IMPELLA 5.5 LEFT VENTRICULAR ASSIST DEVICE;  Surgeon: Kerrin Elspeth BROCKS, MD;  Location: [facility] OR;  Service: Open Heart Surgery;  Laterality: N/A;   RIGHT HEART CATH N/A 09/08/2021   Procedure: RIGHT HEART CATH;  Surgeon: Rolan Ezra RAMAN, MD;  Location: [facility] INVASIVE CV LAB;  Service: Cardiovascular;  Laterality: N/A;   TEE WITHOUT CARDIOVERSION N/A 09/12/2021   Procedure: TRANSESOPHAGEAL ECHOCARDIOGRAM (TEE);  Surgeon: Kerrin Elspeth BROCKS, MD;  Location: [facility] OR;  Service: Open Heart Surgery;  Laterality: N/A;   TEE WITHOUT CARDIOVERSION N/A 09/17/2021   Procedure: TRANSESOPHAGEAL ECHOCARDIOGRAM (TEE);  Surgeon: Lucas Dorise POUR, MD;  Location: [facility] OR;  Service: Open Heart Surgery;  Laterality: N/A;   Patient Active Problem List   Diagnosis Date Noted   Acute gastric ulcer with hemorrhage 03/29/2024   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   Melena 03/22/2024   ABLA (acute blood loss anemia) 03/22/2024   Anticoagulated 03/22/2024   AVM (arteriovenous malformation) of small bowel, acquired with hemorrhage 03/22/2024   Upper GI bleed 03/21/2024   Hematoma 02/17/2024   Anemia 02/15/2024  Hypotension 02/15/2024   Sundowning 02/11/2024   Insomnia 02/11/2024   Constipation 02/11/2024   Stage 3b chronic kidney disease (HCC) 02/11/2024   Cardioembolic stroke (HCC) 02/10/2024   Acute left PCA stroke (HCC) 02/05/2024   Abnormal thyroid  blood test 04/05/2023   Abscess of left groin 11/27/2022   VT (ventricular tachycardia) (HCC) 10/27/2022   ICD (implantable cardioverter-defibrillator) in place 10/27/2022   Pressure injury of skin 10/07/2021   LVAD (left ventricular assist device) present (HCC)     Protein-calorie malnutrition, severe 09/10/2021   Elevated TSH    PICC (peripherally inserted central catheter) in place    Cardiogenic shock (HCC)    Syncope 09/07/2021   CHF (congestive heart failure) (HCC) 09/06/2021   CAD (coronary artery disease) 08/22/2021   Cardiomyopathy (HCC) 08/22/2021   Mixed hyperlipidemia 08/22/2021   A-fib (HCC) 08/22/2021   Elevated troponin 08/22/2021   Acute on chronic combined systolic and diastolic CHF (congestive heart failure) (HCC) 08/22/2021   Benign essential hypertension 02/22/2015   Chronic systolic heart failure (HCC) 07/05/2014    ONSET DATE: 02/05/24  REFERRING DIAG:  P36.467 (ICD-10-CM) - Cerebral infarction due to unspecified occlusion or stenosis of left posterior cerebral artery   THERAPY DIAG:  Abnormality of gait  Imbalance  Muscle weakness (generalized)  Rationale for Evaluation and Treatment: Rehabilitation  SUBJECTIVE:                                                                                                                                                                                             SUBJECTIVE STATEMENT:   Pt reports doing well today. Pt denies any recent falls/stumbles since prior session. Pt denies any updates to medications or medical appointment since prior session. Pt reports good compliance with HEP when time permits.    PERTINENT HISTORY:  Presented 02/05/2024 s/p fall, right sided weakness and aphasia with blurred vision. CT positive for acute  left PCA left cerebellar infarction. Admitted to  rehab 02/10/2024 for inpatient therapies to consist of PT, ST and OT at least three hours five days a week. Started OPPT here, for 7 visits, then admitted to [facility] for GIB 03/21/24. PMH:  Atrial fibrillation/VT chronic systolic congestive heart failure status post LVAD 09/17/2021.   PAIN:  Are you having pain? No  PRECAUTIONS: ICD/Pacemaker and Other: LVAD  WEIGHT BEARING RESTRICTIONS: None  FALLS: Has  patient fallen in last 6 months? No  LIVING ENVIRONMENT: Lives with: lives with their spouse Stevie) Lives in: House/apartment Stairs: Yes: Internal: 9 steps; on left going up Has following equipment at home: Single point cane, Walker - 2 wheeled, and does not use  PLOF: Independent with basic ADLs and Independent with household mobility without device, driving  PATIENT GOALS: pt reports the sight in R visual field is limited. Improve strength in R LE primarily. Improve balance slightly.   OBJECTIVE:  Note: Objective measures were completed at Evaluation unless otherwise noted.    GAIT: Findings: Gait Characteristics: occasional decreased R foot clearance during swing, step through pattern, decreased arm swing- Right, decreased arm swing- Left, and decreased stance time- Right, Distance walked: 60, Assistive device utilized:None, and Level of assistance: Modified independence and SBA  FUNCTIONAL TESTS:  5 times sit to stand: 15.92 seconds 6 minute walk test: 1284 feet (391 meters, Avg speed 1.59m/s), no AD 10 meter walk test: need to assess FGA: need to assess Mini-Best Test: need to assess  PATIENT SURVEYS:  Stroke Impact Scale 72%                                                                                                                              TREATMENT DATE: 05/01/2024    TA- To improve functional movements patterns for everyday tasks   Gait with 3# AW x 500 ft  STS x 12 arms over chest  Gait with 3# AW x 500 ft  STS x 12 arms over chest  Gait with 3# AW x 500 ft STS x 12 arms over chest   NMR: To facilitate reeducation of movement, balance, posture, coordination, and/or proprioception/kinesthetic sense.  Airex side step x 5 min, cues for step width for optimal balance  Airex beam stance 3 x 1 min holds   Airex step taps 2 x 10 ea   Airex lateral step on / off x 10 ea   Unless otherwise stated, CGA was provided and gait belt donned in order to ensure pt  safety   PATIENT EDUCATION: Education details: therapy POC, findings from assessment, continuation of prior HEP Person educated: Patient Education method: Explanation Education comprehension: verbalized understanding and needs further education  HOME EXERCISE PROGRAM: Access Code: [email or web address] URL: [email or web address] Date: 04/10/2024 Prepared by: Peggye Linear  Exercises - Tandem Stance with Support  - 1 x daily - 4 x weekly - 3 sets - 4 reps - 20 seconds  hold - Forward and Backward Walking with Eyes Closed and Counter Support  - 1 x daily - 4 x weekly - 3 sets - 5 reps - Side Stepping with Resistance at Thighs and Counter Support  - 1 x daily - 7 x weekly - 2 sets - 10 reps - Forward Step Up with Unilateral Counter Support  - 1 x daily - 7 x weekly - 1-2 sets - 10 reps - Walking  - 3 x daily - 7 x weekly - 1 sets - 6 minutes hold - Sit to Stand with Arms Crossed  - 3 x daily - 7 x weekly - 1 sets - 10 reps  GOALS: Goals reviewed with patient? Yes SHORT TERM GOALS: Target date: 05/16/2024  Patient will be independent in home exercise program to improve strength/mobility for better functional independence with ADLs. Baseline: carried forward prior HEP, will benefit form review and progression Goal status: INITIAL  LONG TERM GOALS: Target date: 06/27/2024 Patient will increase SIS 16 score to equal to or greater than 10 points to demonstrate statistically significant improvement in mobility and quality of life.  Baseline: 72% Goal status: INITIAL  2.  Patient (> 3 years old) will complete five times sit to stand test in < 15 seconds indicating an increased LE strength and improved balance. Baseline:  15.92 seconds Goal status: INITIAL  3.  Patient will increase MiniBest Test score by at least 5 more points to indicate a reduced risk for falling and demonstrate increased independence with functional mobility and ADLs.  Baseline: need to assess; 04/06/24: 21/28 Goal  status: ON GOING  4.  Patient will increase FGA  score to >24/30 as to demonstrate reduced fall risk and improved dynamic gait balance for better safety with community/home ambulation. Baseline: need to assess; 04/06/24: 26/30 Goal status: MET  5.  Patient will increase 6 min walk test to >1474ft to demonstrate decreased fall risk during functional activities as well as improved access to community mobility.  Baseline: 1284 feet (391 meters, Avg speed 1.41m/s), no AD Goal status: INITIAL  ASSESSMENT:  CLINICAL IMPRESSION:   Continued with current plan of care as laid out in evaluation and recent prior sessions. Pt remains motivated to advance progress toward goals in order to maximize independence and safety at home. Pt requires high level assistance and cuing for completion of exercises in order to provide adequate level of stimulation challenge.  Pt closely monitored throughout session pt response and to maximize patient safety during interventions. Pt continues to demonstrate progress toward goals AEB progression of interventions this date either in volume or intensity. Pt will continue to benefit from skilled physical therapy intervention to address impairments, improve QOL, and attain therapy goals.     OBJECTIVE IMPAIRMENTS: Abnormal gait, cardiopulmonary status limiting activity, decreased activity tolerance, decreased balance, decreased coordination, decreased endurance, decreased mobility, difficulty walking, decreased strength, and improper body mechanics.   ACTIVITY LIMITATIONS: carrying, lifting, squatting, stairs, locomotion level, and caring for others  PARTICIPATION LIMITATIONS: cleaning, laundry, driving, shopping, community activity, occupation, and yard work  PERSONAL FACTORS: Age and 3+ comorbidities: LVAD, Pacemaker, hypotension, Stage 3b CKD  are also affecting patient's functional outcome.   REHAB POTENTIAL: Excellent  CLINICAL DECISION MAKING: Evolving/moderate  complexity  EVALUATION COMPLEXITY: Moderate  PLAN:  PT FREQUENCY: 1-2x/week  PT DURATION: 12 weeks  PLANNED INTERVENTIONS: 97164- PT Re-evaluation, 97750- Physical Performance Testing, 97110-Therapeutic exercises, 97530- Therapeutic activity, 97112- Neuromuscular re-education, 97535- Self Care, [id number]- Manual therapy, [phone]- Gait training, [phone]- Canalith repositioning, Patient/Family education, Balance training, Stair training, Taping, Vestibular training, DME instructions, Cryotherapy, Moist heat, and Biofeedback   PLAN FOR NEXT SESSION:  Continue to assess HEP  Dynamic balance and mobility   Note: Portions of this document were prepared using Dragon voice recognition software and although reviewed may contain unintentional dictation errors in syntax, grammar, or spelling.  Lonni KATHEE Gainer PT ,DPT Physical Therapist- [place]  [facility]   10:39 AM 05/01/24

## 2024-05-02 ENCOUNTER — Encounter: Admitting: Occupational Therapy

## 2024-05-02 ENCOUNTER — Encounter: Admitting: Speech Pathology

## 2024-05-02 ENCOUNTER — Encounter (HOSPITAL_COMMUNITY): Admitting: Cardiology

## 2024-05-02 ENCOUNTER — Ambulatory Visit

## 2024-05-04 ENCOUNTER — Encounter: Admitting: Occupational Therapy

## 2024-05-04 ENCOUNTER — Ambulatory Visit

## 2024-05-04 ENCOUNTER — Ambulatory Visit: Admitting: Speech Pathology

## 2024-05-04 DIAGNOSIS — M6281 Muscle weakness (generalized): Secondary | ICD-10-CM

## 2024-05-04 DIAGNOSIS — R41841 Cognitive communication deficit: Secondary | ICD-10-CM

## 2024-05-04 DIAGNOSIS — Z95811 Presence of heart assist device: Secondary | ICD-10-CM

## 2024-05-04 DIAGNOSIS — I639 Cerebral infarction, unspecified: Secondary | ICD-10-CM

## 2024-05-04 NOTE — Therapy (Signed)
 OUTPATIENT PHYSICAL THERAPY TREATMENT   Patient Name: Isaac [name] MRN: [id number] DOB:1951/05/30, 73 y.o., male Today's Date: 05/04/2024   PCP: Valora Agent, MD  REFERRING PROVIDER: Urbano Albright, MD    END OF SESSION:   PT End of Session - 05/04/24 0810     Visit Number 10    Number of Visits 24    Date for PT Re-Evaluation 06/27/24    Progress Note Due on Visit 10    PT Start Time 0806    PT Stop Time 0845    PT Time Calculation (min) 39 min    Equipment Utilized During Treatment Gait belt    Activity Tolerance Patient tolerated treatment well;No increased pain    Behavior During Therapy [facility] for tasks assessed/performed         Past Medical History:  Diagnosis Date   Arrhythmia    atrial fibrillation   CHF (congestive heart failure) (HCC)    Chronic kidney disease    Coronary artery disease    Hyperlipidemia    Hypertension    Myocardial infarct [facility])    Past Surgical History:  Procedure Laterality Date   CARDIAC DEFIBRILLATOR PLACEMENT  feb 2014   COLONOSCOPY N/A 03/29/2024   Procedure: COLONOSCOPY;  Surgeon: Legrand Victory LITTIE DOUGLAS, MD;  Location: [facility] ENDOSCOPY;  Service: Gastroenterology;  Laterality: N/A;   ENTEROSCOPY N/A 03/22/2024   Procedure: ENTEROSCOPY;  Surgeon: Leigh Elspeth SQUIBB, MD;  Location: [facility] ENDOSCOPY;  Service: Gastroenterology;  Laterality: N/A;   ESOPHAGOGASTRODUODENOSCOPY N/A 03/29/2024   Procedure: EGD (ESOPHAGOGASTRODUODENOSCOPY);  Surgeon: Legrand Victory LITTIE DOUGLAS, MD;  Location: [facility] ENDOSCOPY;  Service: Gastroenterology;  Laterality: N/A;   ICD GENERATOR CHANGEOUT N/A 02/09/2024   Procedure: ICD GENERATOR CHANGEOUT;  Surgeon: Waddell Danelle ORN, MD;  Location: [facility] INVASIVE CV LAB;  Service: Cardiovascular;  Laterality: N/A;   INSERTION OF IMPLANTABLE LEFT VENTRICULAR ASSIST DEVICE N/A 09/17/2021   Procedure: INSERTION OF IMPLANTABLE LEFT VENTRICULAR ASSIST DEVICE AND INSERTION OF FEMORAL ARTERIAL LINE;  Surgeon: Lucas Dorise POUR, MD;  Location:  [facility] OR;  Service: Open Heart Surgery;  Laterality: N/A;   IR FLUORO GUIDE CV LINE RIGHT  09/27/2021   IR THORACENTESIS ASP PLEURAL SPACE W/IMG GUIDE  09/27/2021   IR US  GUIDE VASC ACCESS RIGHT  09/27/2021   PLACEMENT OF IMPELLA LEFT VENTRICULAR ASSIST DEVICE N/A 09/12/2021   Procedure: PLACEMENT OF IMPELLA 5.5 LEFT VENTRICULAR ASSIST DEVICE;  Surgeon: Kerrin Elspeth BROCKS, MD;  Location[facility] OR;  Service: Open Heart Surgery;  Laterality: N/A;   RIGHT HEART CATH N/A 09/08/2021   Procedure: RIGHT HEART CATH;  Surgeon: Rolan Ezra RAMAN, MD;  Location: [facility] INVASIVE CV LAB;  Service: Cardiovascular;  Laterality: N/A;   TEE WITHOUT CARDIOVERSION N/A 09/12/2021   Procedure: TRANSESOPHAGEAL ECHOCARDIOGRAM (TEE);  Surgeon: Kerrin Elspeth BROCKS, MD;  Location: [facility] OR;  Service: Open Heart Surgery;  Laterality: N/A;   TEE WITHOUT CARDIOVERSION N/A 09/17/2021   Procedure: TRANSESOPHAGEAL ECHOCARDIOGRAM (TEE);  Surgeon: Lucas Dorise POUR, MD;  Location: [facility] OR;  Service: Open Heart Surgery;  Laterality: N/A;   Patient Active Problem List   Diagnosis Date Noted   Acute gastric ulcer with hemorrhage 03/29/2024   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   Melena 03/22/2024   ABLA (acute blood loss anemia) 03/22/2024   Anticoagulated 03/22/2024   AVM (arteriovenous malformation) of small bowel, acquired with hemorrhage 03/22/2024   Upper GI bleed 03/21/2024   Hematoma 02/17/2024   Anemia 02/15/2024   Hypotension 02/15/2024   Sundowning 02/11/2024   Insomnia  02/11/2024   Constipation 02/11/2024   Stage 3b chronic kidney disease (HCC) 02/11/2024   Cardioembolic stroke (HCC) 02/10/2024   Acute left PCA stroke (HCC) 02/05/2024   Abnormal thyroid  blood test 04/05/2023   Abscess of left groin 11/27/2022   VT (ventricular tachycardia) (HCC) 10/27/2022   ICD (implantable cardioverter-defibrillator) in place 10/27/2022   Pressure injury of skin 10/07/2021   LVAD (left ventricular assist device) present (HCC)    Protein-calorie  malnutrition, severe 09/10/2021   Elevated TSH    PICC (peripherally inserted central catheter) in place    Cardiogenic shock (HCC)    Syncope 09/07/2021   CHF (congestive heart failure) (HCC) 09/06/2021   CAD (coronary artery disease) 08/22/2021   Cardiomyopathy (HCC) 08/22/2021   Mixed hyperlipidemia 08/22/2021   A-fib (HCC) 08/22/2021   Elevated troponin 08/22/2021   Acute on chronic combined systolic and diastolic CHF (congestive heart failure) (HCC) 08/22/2021   Benign essential hypertension 02/22/2015   Chronic systolic heart failure (HCC) 07/05/2014    ONSET DATE: 02/05/24  REFERRING DIAG:  P36.467 (ICD-10-CM) - Cerebral infarction due to unspecified occlusion or stenosis of left posterior cerebral artery   THERAPY DIAG:  Muscle weakness (generalized)  Cardioembolic stroke (HCC)  LVAD (left ventricular assist device) present (HCC)  Rationale for Evaluation and Treatment: Rehabilitation  SUBJECTIVE:                                                                                                                                                                                             SUBJECTIVE STATEMENT:    Pt reports the thing he is struggling with is his eyesight.  Pt feels physically, he is doing well.  PERTINENT HISTORY:  Presented 02/05/2024 s/p fall, right sided weakness and aphasia with blurred vision. CT positive for acute  left PCA left cerebellar infarction. Admitted to  rehab 02/10/2024 for inpatient therapies to consist of PT, ST and OT at least three hours five days a week. Started OPPT here, for 7 visits, then admitted to [facility] for GIB 03/21/24. PMH:  Atrial fibrillation/VT chronic systolic congestive heart failure status post LVAD 09/17/2021.   PAIN:  Are you having pain? No  PRECAUTIONS: ICD/Pacemaker and Other: LVAD  WEIGHT BEARING RESTRICTIONS: None  FALLS: Has patient fallen in last 6 months? No  LIVING ENVIRONMENT: Lives with: lives with their  spouse Stevie) Lives in: House/apartment Stairs: Yes: Internal: 9 steps; on left going up Has following equipment at home: Single point cane, Walker - 2 wheeled, and does not use  PLOF: Independent with basic ADLs and Independent with household mobility without device, driving  PATIENT GOALS: pt reports  the sight in R visual field is limited. Improve strength in R LE primarily. Improve balance slightly.   OBJECTIVE:  Note: Objective measures were completed at Evaluation unless otherwise noted.    GAIT: Findings: Gait Characteristics: occasional decreased R foot clearance during swing, step through pattern, decreased arm swing- Right, decreased arm swing- Left, and decreased stance time- Right, Distance walked: 60, Assistive device utilized:None, and Level of assistance: Modified independence and SBA  FUNCTIONAL TESTS:  5 times sit to stand: 15.92 seconds 6 minute walk test: 1284 feet (391 meters, Avg speed 1.76m/s), no AD 10 meter walk test: need to assess FGA: need to assess Mini-Best Test: need to assess  PATIENT SURVEYS:  Stroke Impact Scale 72%                                                                                                                              TREATMENT DATE: 05/04/2024     Physical Performance Testing  Five times Sit to Stand Test (FTSS)  TIME: 14.13 sec  Cut off scores indicative of increased fall risk: >12 sec CVA, >16 sec PD, >13 sec vestibular (ANPTA Core Set of Outcome Measures for Adults with Neurologic Conditions, 2018)  Pt scores 26 / 28 on mini BEST balance test. Scores < 16 indicate increased risk for falls [place], [place], & [place]) and MCID is 4 (Godi,et al, 2013)   6 Min Walk Test:  Instructed patient to ambulate as quickly and as safely as possible for 6 minutes using LRAD. Patient was allowed to take standing rest breaks without stopping the test, but if the patient required a sitting rest break the clock would be stopped and the  test would be over.  Results: 1400 feet using no AD with supervision. Results indicate that the patient has reduced endurance with ambulation compared to age matched norms.  Age Matched Norms (in meters): 38-69 yo M: 42 F: 63, 41-79 yo M: 41 F: 471, 31-89 yo M: 417 F: 392 MDC: 58.21 meters (190.98 feet) or 50 meters (ANPTA Core Set of Outcome Measures for Adults with Neurologic Conditions, 2018)   PATIENT EDUCATION: Education details: therapy POC, findings from assessment, continuation of prior HEP Person educated: Patient Education method: Explanation Education comprehension: verbalized understanding and needs further education  HOME EXERCISE PROGRAM: Access Code: [email or web address] URL: [email or web address] Date: 04/10/2024 Prepared by: Peggye Linear  Exercises - Tandem Stance with Support  - 1 x daily - 4 x weekly - 3 sets - 4 reps - 20 seconds  hold - Forward and Backward Walking with Eyes Closed and Counter Support  - 1 x daily - 4 x weekly - 3 sets - 5 reps - Side Stepping with Resistance at Thighs and Counter Support  - 1 x daily - 7 x weekly - 2 sets - 10 reps - Forward Step Up with Unilateral Counter Support  - 1 x daily - 7 x weekly - 1-2 sets - 10 reps -  Walking  - 3 x daily - 7 x weekly - 1 sets - 6 minutes hold - Sit to Stand with Arms Crossed  - 3 x daily - 7 x weekly - 1 sets - 10 reps  GOALS: Goals reviewed with patient? Yes SHORT TERM GOALS: Target date: 05/16/2024  Patient will be independent in home exercise program to improve strength/mobility for better functional independence with ADLs. Baseline: carried forward prior HEP, will benefit form review and progression Goal status: INITIAL  LONG TERM GOALS: Target date: 06/27/2024 Patient will increase SIS 16 score to equal to or greater than 10 points to demonstrate statistically significant improvement in mobility and quality of life.  Baseline: 72% 05/04/24: 89% MET Goal status: INITIAL  2.  Patient (>  60 years old) will complete five times sit to stand test in < 15 seconds indicating an increased LE strength and improved balance. Baseline:  15.92 seconds 05/04/24: 14.13 sec Goal status: INITIAL  3.  Patient will increase MiniBest Test score by at least 5 more points to indicate a reduced risk for falling and demonstrate increased independence with functional mobility and ADLs.  Baseline: need to assess; 04/06/24: 21/28 05/04/24:  Goal status: ON GOING  4.  Patient will increase FGA  score to >24/30 as to demonstrate reduced fall risk and improved dynamic gait balance for better safety with community/home ambulation. Baseline: need to assess; 04/06/24: 26/30 Goal status: MET  5.  Patient will increase 6 min walk test to >1485ft to demonstrate decreased fall risk during functional activities as well as improved access to community mobility.  Baseline: 1284 feet (391 meters, Avg speed 1.41m/s), no AD 05/04/24: 1400 feet (427 meters, Avg speed 1.19 m/s) no AD Goal status: MET  ASSESSMENT:  CLINICAL IMPRESSION:    Pt able to meet all goals at the time and pt feels as though he is ready to be discharged.  Pt was formally educated on the importance of remaining active and participating in an active lifestyle.  Pt encouraged to join the gym however pt feels as though his eyesight is his problem more than anything.  Pt encouraged to have his wife join him at the gym as this is something they have done in the past and she can assist in getting him there.  Pt encouraged to contact the clinic if any needs arise.  Pt is formally discharged at this time.      OBJECTIVE IMPAIRMENTS: Abnormal gait, cardiopulmonary status limiting activity, decreased activity tolerance, decreased balance, decreased coordination, decreased endurance, decreased mobility, difficulty walking, decreased strength, and improper body mechanics.   ACTIVITY LIMITATIONS: carrying, lifting, squatting, stairs, locomotion level, and  caring for others  PARTICIPATION LIMITATIONS: cleaning, laundry, driving, shopping, community activity, occupation, and yard work  PERSONAL FACTORS: Age and 3+ comorbidities: LVAD, Pacemaker, hypotension, Stage 3b CKD  are also affecting patient's functional outcome.   REHAB POTENTIAL: Excellent  CLINICAL DECISION MAKING: Evolving/moderate complexity  EVALUATION COMPLEXITY: Moderate  PLAN:  PT FREQUENCY: 1-2x/week  PT DURATION: 12 weeks  PLANNED INTERVENTIONS: 97164- PT Re-evaluation, 97750- Physical Performance Testing, 97110-Therapeutic exercises, 97530- Therapeutic activity, 97112- Neuromuscular re-education, 97535- Self Care, [id number]- Manual therapy, [phone]- Gait training, [phone]- Canalith repositioning, Patient/Family education, Balance training, Stair training, Taping, Vestibular training, DME instructions, Cryotherapy, Moist heat, and Biofeedback   PLAN FOR NEXT SESSION:  Continue to assess HEP  Dynamic balance and mobility    Fonda Simpers, PT, DPT Physical Therapist - [facility]  [facility]  05/04/24, 8:10  AM

## 2024-05-04 NOTE — Therapy (Signed)
 OUTPATIENT SPEECH LANGUAGE PATHOLOGY  COGNITIVE COMMUNICATION TREATMENT NOTE   Patient Name: Isaac [name] MRN: [id number] DOB:1950/11/17, 73 y.o., male Today's Date: 05/04/2024  PCP: Lynwood Null, MD REFERRING PROVIDER: Toribio Pitch, PA   End of Session - 05/04/24 0914     Visit Number 17    Number of Visits 25    Date for SLP Re-Evaluation 05/17/24    Authorization Type [employer] HMO/PPO    Progress Note Due on Visit 20    SLP Start Time 0845    SLP Stop Time  0930    SLP Time Calculation (min) 45 min    Activity Tolerance Patient tolerated treatment well          Past Medical History:  Diagnosis Date   Arrhythmia    atrial fibrillation   CHF (congestive heart failure) (HCC)    Chronic kidney disease    Coronary artery disease    Hyperlipidemia    Hypertension    Myocardial infarct [facility])    Past Surgical History:  Procedure Laterality Date   CARDIAC DEFIBRILLATOR PLACEMENT  feb 2014   COLONOSCOPY N/A 03/29/2024   Procedure: COLONOSCOPY;  Surgeon: Legrand Victory LITTIE DOUGLAS, MD;  Location: [facility] ENDOSCOPY;  Service: Gastroenterology;  Laterality: N/A;   ENTEROSCOPY N/A 03/22/2024   Procedure: ENTEROSCOPY;  Surgeon: Leigh Elspeth SQUIBB, MD;  Location: [facility] ENDOSCOPY;  Service: Gastroenterology;  Laterality: N/A;   ESOPHAGOGASTRODUODENOSCOPY N/A 03/29/2024   Procedure: EGD (ESOPHAGOGASTRODUODENOSCOPY);  Surgeon: Legrand Victory LITTIE DOUGLAS, MD;  Location: [facility] ENDOSCOPY;  Service: Gastroenterology;  Laterality: N/A;   ICD GENERATOR CHANGEOUT N/A 02/09/2024   Procedure: ICD GENERATOR CHANGEOUT;  Surgeon: Waddell Danelle ORN, MD;  Location: [facility] INVASIVE CV LAB;  Service: Cardiovascular;  Laterality: N/A;   INSERTION OF IMPLANTABLE LEFT VENTRICULAR ASSIST DEVICE N/A 09/17/2021   Procedure: INSERTION OF IMPLANTABLE LEFT VENTRICULAR ASSIST DEVICE AND INSERTION OF FEMORAL ARTERIAL LINE;  Surgeon: Lucas Dorise POUR, MD;  Location: [facility] OR;  Service: Open Heart Surgery;  Laterality: N/A;   IR FLUORO  GUIDE CV LINE RIGHT  09/27/2021   IR THORACENTESIS ASP PLEURAL SPACE W/IMG GUIDE  09/27/2021   IR US  GUIDE VASC ACCESS RIGHT  09/27/2021   PLACEMENT OF IMPELLA LEFT VENTRICULAR ASSIST DEVICE N/A 09/12/2021   Procedure: PLACEMENT OF IMPELLA 5.5 LEFT VENTRICULAR ASSIST DEVICE;  Surgeon: Kerrin Elspeth BROCKS, MD;  Location: [facility] OR;  Service: Open Heart Surgery;  Laterality: N/A;   RIGHT HEART CATH N/A 09/08/2021   Procedure: RIGHT HEART CATH;  Surgeon: Rolan Ezra RAMAN, MD;  Location: [facility] INVASIVE CV LAB;  Service: Cardiovascular;  Laterality: N/A;   TEE WITHOUT CARDIOVERSION N/A 09/12/2021   Procedure: TRANSESOPHAGEAL ECHOCARDIOGRAM (TEE);  Surgeon: Kerrin Elspeth BROCKS, MD;  Location: [facility] OR;  Service: Open Heart Surgery;  Laterality: N/A;   TEE WITHOUT CARDIOVERSION N/A 09/17/2021   Procedure: TRANSESOPHAGEAL ECHOCARDIOGRAM (TEE);  Surgeon: Lucas Dorise POUR, MD;  Location: [facility] OR;  Service: Open Heart Surgery;  Laterality: N/A;   Patient Active Problem List   Diagnosis Date Noted   Acute gastric ulcer with hemorrhage 03/29/2024   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   Melena 03/22/2024   ABLA (acute blood loss anemia) 03/22/2024   Anticoagulated 03/22/2024   AVM (arteriovenous malformation) of small bowel, acquired with hemorrhage 03/22/2024   Upper GI bleed 03/21/2024   Hematoma 02/17/2024   Anemia 02/15/2024   Hypotension 02/15/2024   Sundowning 02/11/2024   Insomnia 02/11/2024   Constipation 02/11/2024   Stage 3b chronic kidney disease (HCC) 02/11/2024  Cardioembolic stroke (HCC) 02/10/2024   Acute left PCA stroke (HCC) 02/05/2024   Abnormal thyroid  blood test 04/05/2023   Abscess of left groin 11/27/2022   VT (ventricular tachycardia) (HCC) 10/27/2022   ICD (implantable cardioverter-defibrillator) in place 10/27/2022   Pressure injury of skin 10/07/2021   LVAD (left ventricular assist device) present (HCC)    Protein-calorie malnutrition, severe 09/10/2021   Elevated TSH    PICC (peripherally  inserted central catheter) in place    Cardiogenic shock (HCC)    Syncope 09/07/2021   CHF (congestive heart failure) (HCC) 09/06/2021   CAD (coronary artery disease) 08/22/2021   Cardiomyopathy (HCC) 08/22/2021   Mixed hyperlipidemia 08/22/2021   A-fib (HCC) 08/22/2021   Elevated troponin 08/22/2021   Acute on chronic combined systolic and diastolic CHF (congestive heart failure) (HCC) 08/22/2021   Benign essential hypertension 02/22/2015   Chronic systolic heart failure (HCC) 07/05/2014    ONSET DATE: 02/05/2024; date of referral  02/22/2024  REFERRING DIAG: P36.467 (ICD-10-CM) - Cerebral infarction due to unspecified occlusion or stenosis of left posterior cerebral artery  THERAPY DIAG:  Cognitive communication deficit  Rationale for Evaluation and Treatment Rehabilitation  SUBJECTIVE:   PERTINENT HISTORY and DIAGNOSTIC FINDINGS:  [name] is a 73 y.o. with history of CAD, HTN, atrial fibrillation, CKD IV, VT and chronic systolic CHFs/p HM-3 LVAD on 09/17/21.  He presented to the [facility] ED on 02/05/24 for focal neuro deficits with R vision field cut and dysmetria. Code Stroke activated. CT brain positive for acute to subacute appearing Left PCA and Left cerebellar infarcts. Hyperdense Left PCA suggests occlusion of that vessel. No hemorrhagic transformation or mass effect. Seen by Neuro and decision made to treat with IV heparin . Echocardiogram shows EF 25 to 30% with LVAD at the apex.  Left atrial is moderately dilate. CT angio distal left P2/P3 occlusion.Repeat CT head 02/06/24 with evolving left cerebellar, left PCA and right frontal gyrus MCA branch infarct. No hemorrhage.  Neurology feels infarcts 2/2 LVAD and signed off  Pt attended [facility] CIR from until 02/18/2024, received ST services targeting cognitive linguistic abilities and discharged at MOD A for cognitive linguistic skills. Memory/orientation deficits remain the pt's biggest barrier to progress and  overall safety.   He was admitted on 03/21/2024 from the office for GI bleed with hemoglobin down to 6.4. Coumadin  was held. He was placed on PPI. GI was consulted and he was transfused multiple PRBCs (total of 7 units this admission). He underwent EGD on 7/2 with friable tissue in the duodenum and 4 AVMS which were treated with APC. INR goal was adjusted to 2-2.5. Unfortunately, he continued to have melena and his hemoglobin continued to drop. He underwent repeat EGD + colonoscopy on 7/9  which showed 1 non-bleeding superficial gastric ulcer and 1 clip was placed. Colonoscopy unremarkable. No further bleeding or drop in hemoglobin. INR 1.8 on day of discharge.   PAIN:  Are you having pain? No   FALLS: Has patient fallen in last 6 months?  No  LIVING ENVIRONMENT: Lives with: lives with their spouse Lives in: House/apartment  PLOF:  Level of assistance: Independent with ADLs, Independent with IADLs Employment: Retired   PATIENT GOALS   to improve functional independence  SUBJECTIVE STATEMENT: Pt eager Pt accompanied by: self  OBJECTIVE:  TODAY'S TREATMENT:   Skilled treatment session focused on pt's cognitive communication goals. SLP facilitated session by providing the following interventions:  Pt brought in his medicines. checked against those in medical record -  all accurate While pt brought in pill organizer it was empty. He reports having one at home but difficult to understand the logistics of use. But he reports that both pill organizers are identical and his wife organizers his pills each week. Pt instructed to complete organization based on instructions on medicine labels. With the exception of his Warfirin, pt was independent with good demonstration of self-monitoring and double checking. Pt wit himproved comprehension of Warfarin instructions when writing down each day and the recommended number of tablets.   Pt reports that he was discharged from PT services today.  Therefore this Clinical research associate requested support staff print pt new calendar with only ST sessions listed.   PATIENT EDUCATION: Education details: see above Person educated: Patient  Education method: Explanation Education comprehension: verbalized understanding   HOME EXERCISE PROGRAM:   Set alarms on phone [facility] Therapy, see above   GOALS:  Goals reviewed with patient? Yes  SHORT TERM GOALS: Target date: 10 sessions  With Mod A, pt will utilize external memory aids to answer questions regarding orientation information with 80% accuracy.  Baseline: Goal status: INITIAL  2.  With Mod A, pt will improve word finding by using strategies to produce generative naming list of equipment and vocabulary related to LVAD.  Baseline:  Goal status: INITIAL  3.  Pt will complete addition cognitive assessment to establish current level of ability.  Baseline: Pt discharged from CIR requiring Mod A for basic cognitive function.  Goal status: INITIAL   LONG TERM GOALS: Target date: 05/17/2024  With supervision A, pt will attend to items on his right to improve safety awareness and accuracy of task completion in 5 out of 7 opportunities.  Baseline:  Goal status: INITIAL  2.  With Min A, pt will external and internal memory aides to help short term and prospective memory in 5 out of 7 opportunities.  Baseline:  Goal status: INITIAL  3.  With supervision A, pt will be able to verbally sequence (using appropriate vocabulary) LVAD features, alerts and switching battery sources.  Baseline:  Goal status: INITIAL   ASSESSMENT:  CLINICAL IMPRESSION: Patient is a 73 y.o. male LVAD patient, who was seen today for a cognitive communication treatment d/t left cerebellar, left PCA and right frontal gyrus MCA branch infarct. Pt presents with severe cognitive impairment that is c/b by severe deficits in attention, awareness (pt is aware of difficulty/poor performance within tasks but unaware of overall  impairment and impact on safety), profound memory deficits in immediate recall, short term memory, retrieval, visual, auditory and prospective memory decreased awareness of confabulatory responses and severe deficits in executive function as observed during visuospatial tasks, specifically task planning, organization, sequencing with pt losing points d/t slow task completion. Overall adequate expressive and receptive communication.   Pt presents with improved in immediate memory (now within average limits) and attention (now within the average range), low average language abilities (difficulty with verbal fluency tasks; 11 total fruits and vegetables listed within 1 minute). Extremely low visuospatial abilities (continue to recommend evaluation of any visual field deficits by neuro-ophthalmology) as well as extremely low delayed memory abilities (continue to recommend use of external memory aids).    Pt continues to dmeonstrate improved cognitive communicaiton abiities that are c/b by improved recall, problem solving and ability to self-monitor and self-correct.   Current goals continue to be appropriate with pt demonstrating progress in today's session. Pt continues to be eager and puts forth great effort. See the above treatment note for  details.     OBJECTIVE IMPAIRMENTS include attention, memory, awareness, and executive functioning. These impairments are limiting patient from managing medications, managing appointments, managing finances, household responsibilities, and ADLs/IADLs. Factors affecting potential to achieve goals and functional outcome are ability to learn/carryover information, co-morbidities, medical prognosis, previous level of function, and severity of impairments. Patient will benefit from skilled SLP services to address above impairments and improve overall function.  REHAB POTENTIAL: Good  PLAN: SLP FREQUENCY: 1-2x/week  SLP DURATION: 12 weeks  PLANNED INTERVENTIONS:  Language facilitation, Environmental controls, Cueing hierachy, Cognitive reorganization, Internal/external aids, Functional tasks, SLP instruction and feedback, Compensatory strategies, and Patient/family education   Heily Carlucci B. Rubbie, M.S., CCC-SLP, Tree surgeon Certified Brain Injury Specialist [place]  [facility] Rehabilitation Services Office [phone] Ascom [phone] Fax [phone]

## 2024-05-05 ENCOUNTER — Ambulatory Visit (HOSPITAL_COMMUNITY): Payer: Self-pay | Admitting: Pharmacist

## 2024-05-05 LAB — POCT INR: INR: 2.1 (ref 2.0–3.0)

## 2024-05-09 ENCOUNTER — Ambulatory Visit: Admitting: Speech Pathology

## 2024-05-09 ENCOUNTER — Ambulatory Visit

## 2024-05-09 ENCOUNTER — Encounter

## 2024-05-09 DIAGNOSIS — R41841 Cognitive communication deficit: Secondary | ICD-10-CM | POA: Diagnosis not present

## 2024-05-09 NOTE — Progress Notes (Unsigned)
  Electrophysiology Office Note:   ID:  Isaac [name], DOB Nov 07, 1950, MRN [id number]  Primary Cardiologist: Ezra Shuck, MD Electrophysiologist: Elspeth Sage, MD  {Click to update primary MD,subspecialty MD or APP then REFRESH:1}    History of Present Illness:   Isaac [name] is a 73 y.o. male with h/o VT, NICM, s/p ICD, LVAD, afib, CKD IV, previously requiring HD, CAD s/p PCI seen today for routine electrophysiology follow-up s/p Gen Change.  Since last being seen in our clinic the patient reports doing ***.  he denies chest pain, palpitations, dyspnea, PND, orthopnea, nausea, vomiting, dizziness, syncope, edema, weight gain, or early satiety.    Review of systems complete and found to be negative unless listed in HPI.   EP Information / Studies Reviewed:    EKG is not ordered today. EKG from 03/21/2024 reviewed which showed A paced rhythm with noise       ICD Interrogation-  reviewed in detail today,  See PACEART report.  Arrhythmia/Device History BSX ICD LATITUDE-DOI5/21/2025-RA,RV-GT-MONITOR ONLY NO ATP OR SHOCKS ARE AVAILABLE PER JOEY. KEM, CMA Bos Sci dual chamber ICD, 10/2012 for CHF ERI 05/2023 > Gen change 02/09/2024 with recurrent bradycardia  HM III LVAD pt- Dr Shuck VAD Pager: [phone]   Physical Exam:   VS:  There were no vitals taken for this visit.   Wt Readings from Last 3 Encounters:  04/04/24 181 lb 9.6 oz (82.4 kg)  04/02/24 174 lb 9.7 oz (79.2 kg)  03/21/24 178 lb (80.7 kg)     GEN: No acute distress NECK: No JVD; No carotid bruits CARDIAC: LVAD HUM  RESPIRATORY:  Clear to auscultation without rales, wheezing or rhonchi  ABDOMEN: Soft, non-tender, non-distended EXTREMITIES:  No edema; No deformity   ASSESSMENT AND PLAN:    Chronic systolic CHF  s/p Environmental manager dual chamber ICD  euvolemic today Stable on an appropriate medical regimen Normal ICD function See Pace Art report No changes today  NICM S/p LVAD H/o VT Per CHF  team  CKD IV Baseline Cr 2.5   Disposition:   Follow up with {EPPROVIDERS:28135::EP Team} {EPFOLLOW UP:28173}   Signed, Ozell Prentice Passey, PA-C

## 2024-05-09 NOTE — Therapy (Signed)
 OUTPATIENT SPEECH LANGUAGE PATHOLOGY  COGNITIVE COMMUNICATION TREATMENT NOTE   Patient Name: Isaac [name] MRN: [id number] DOB:[birth year], 73 y.o., male Today's Date: 05/09/2024  PCP: Lynwood Null, MD REFERRING PROVIDER: Toribio Pitch, PA   End of Session - 05/09/24 0942     Visit Number 18    Number of Visits 25    Date for SLP Re-Evaluation 05/17/24    Authorization Type [employer] HMO/PPO    Progress Note Due on Visit 20    SLP Start Time 0935    SLP Stop Time  1015    SLP Time Calculation (min) 40 min    Activity Tolerance Patient tolerated treatment well          Past Medical History:  Diagnosis Date   Arrhythmia    atrial fibrillation   CHF (congestive heart failure) (HCC)    Chronic kidney disease    Coronary artery disease    Hyperlipidemia    Hypertension    Myocardial infarct [facility])    Past Surgical History:  Procedure Laterality Date   CARDIAC DEFIBRILLATOR PLACEMENT  feb 2014   COLONOSCOPY N/A 03/29/2024   Procedure: COLONOSCOPY;  Surgeon: Legrand Victory LITTIE DOUGLAS, MD;  Location: [facility] ENDOSCOPY;  Service: Gastroenterology;  Laterality: N/A;   ENTEROSCOPY N/A 03/22/2024   Procedure: ENTEROSCOPY;  Surgeon: Leigh Elspeth SQUIBB, MD;  Location: [facility] ENDOSCOPY;  Service: Gastroenterology;  Laterality: N/A;   ESOPHAGOGASTRODUODENOSCOPY N/A 03/29/2024   Procedure: EGD (ESOPHAGOGASTRODUODENOSCOPY);  Surgeon: Legrand Victory LITTIE DOUGLAS, MD;  Location: [facility] ENDOSCOPY;  Service: Gastroenterology;  Laterality: N/A;   ICD GENERATOR CHANGEOUT N/A 02/09/2024   Procedure: ICD GENERATOR CHANGEOUT;  Surgeon: Waddell Danelle ORN, MD;  Location: [facility] INVASIVE CV LAB;  Service: Cardiovascular;  Laterality: N/A;   INSERTION OF IMPLANTABLE LEFT VENTRICULAR ASSIST DEVICE N/A 09/17/2021   Procedure: INSERTION OF IMPLANTABLE LEFT VENTRICULAR ASSIST DEVICE AND INSERTION OF FEMORAL ARTERIAL LINE;  Surgeon: Lucas Dorise POUR, MD;  Location: [facility] OR;  Service: Open Heart Surgery;  Laterality: N/A;   IR FLUORO  GUIDE CV LINE RIGHT  09/27/2021   IR THORACENTESIS ASP PLEURAL SPACE W/IMG GUIDE  09/27/2021   IR US  GUIDE VASC ACCESS RIGHT  09/27/2021   PLACEMENT OF IMPELLA LEFT VENTRICULAR ASSIST DEVICE N/A 09/12/2021   Procedure: PLACEMENT OF IMPELLA 5.5 LEFT VENTRICULAR ASSIST DEVICE;  Surgeon: Kerrin Elspeth BROCKS, MD;  Location: [facility] OR;  Service: Open Heart Surgery;  Laterality: N/A;   RIGHT HEART CATH N/A 09/08/2021   Procedure: RIGHT HEART CATH;  Surgeon: Rolan Ezra RAMAN, MD;  Location: [facility] INVASIVE CV LAB;  Service: Cardiovascular;  Laterality: N/A;   TEE WITHOUT CARDIOVERSION N/A 09/12/2021   Procedure: TRANSESOPHAGEAL ECHOCARDIOGRAM (TEE);  Surgeon: Kerrin Elspeth BROCKS, MD;  Location: [facility] OR;  Service: Open Heart Surgery;  Laterality: N/A;   TEE WITHOUT CARDIOVERSION N/A 09/17/2021   Procedure: TRANSESOPHAGEAL ECHOCARDIOGRAM (TEE);  Surgeon: Lucas Dorise POUR, MD;  Location: [facility] OR;  Service: Open Heart Surgery;  Laterality: N/A;   Patient Active Problem List   Diagnosis Date Noted   Acute gastric ulcer with hemorrhage 03/29/2024   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   Melena 03/22/2024   ABLA (acute blood loss anemia) 03/22/2024   Anticoagulated 03/22/2024   AVM (arteriovenous malformation) of small bowel, acquired with hemorrhage 03/22/2024   Upper GI bleed 03/21/2024   Hematoma 02/17/2024   Anemia 02/15/2024   Hypotension 02/15/2024   Sundowning 02/11/2024   Insomnia 02/11/2024   Constipation 02/11/2024   Stage 3b chronic kidney disease (HCC) 02/11/2024  Cardioembolic stroke (HCC) 02/10/2024   Acute left PCA stroke (HCC) 02/05/2024   Abnormal thyroid  blood test 04/05/2023   Abscess of left groin 11/27/2022   VT (ventricular tachycardia) (HCC) 10/27/2022   ICD (implantable cardioverter-defibrillator) in place 10/27/2022   Pressure injury of skin 10/07/2021   LVAD (left ventricular assist device) present (HCC)    Protein-calorie malnutrition, severe 09/10/2021   Elevated TSH    PICC (peripherally  inserted central catheter) in place    Cardiogenic shock (HCC)    Syncope 09/07/2021   CHF (congestive heart failure) (HCC) 09/06/2021   CAD (coronary artery disease) 08/22/2021   Cardiomyopathy (HCC) 08/22/2021   Mixed hyperlipidemia 08/22/2021   A-fib (HCC) 08/22/2021   Elevated troponin 08/22/2021   Acute on chronic combined systolic and diastolic CHF (congestive heart failure) (HCC) 08/22/2021   Benign essential hypertension 02/22/2015   Chronic systolic heart failure (HCC) 07/05/2014    ONSET DATE: 02/05/2024; date of referral  02/22/2024  REFERRING DIAG: P36.467 (ICD-10-CM) - Cerebral infarction due to unspecified occlusion or stenosis of left posterior cerebral artery  THERAPY DIAG:  Cognitive communication deficit  Rationale for Evaluation and Treatment Rehabilitation  SUBJECTIVE:   PERTINENT HISTORY and DIAGNOSTIC FINDINGS:  Isaac [name] is a 73 y.o. with history of CAD, HTN, atrial fibrillation, CKD IV, VT and chronic systolic CHFs/p HM-3 LVAD on 09/17/21.  He presented to the [facility] ED on 02/05/24 for focal neuro deficits with R vision field cut and dysmetria. Code Stroke activated. CT brain positive for acute to subacute appearing Left PCA and Left cerebellar infarcts. Hyperdense Left PCA suggests occlusion of that vessel. No hemorrhagic transformation or mass effect. Seen by Neuro and decision made to treat with IV heparin . Echocardiogram shows EF 25 to 30% with LVAD at the apex.  Left atrial is moderately dilate. CT angio distal left P2/P3 occlusion.Repeat CT head 02/06/24 with evolving left cerebellar, left PCA and right frontal gyrus MCA branch infarct. No hemorrhage.  Neurology feels infarcts 2/2 LVAD and signed off  Isaac [name] attended [facility] from until 02/18/2024, received ST services targeting cognitive linguistic abilities and discharged at MOD A for cognitive linguistic skills. Memory/orientation deficits remain the Isaac [name]'s biggest barrier to progress and  overall safety.   He was admitted on 03/21/2024 from the office for GI bleed with hemoglobin down to 6.4. Coumadin  was held. He was placed on PPI. GI was consulted and he was transfused multiple PRBCs (total of 7 units this admission). He underwent EGD on 7/2 with friable tissue in the duodenum and 4 AVMS which were treated with APC. INR goal was adjusted to 2-2.5. Unfortunately, he continued to have melena and his hemoglobin continued to drop. He underwent repeat EGD + colonoscopy on 7/9  which showed 1 non-bleeding superficial gastric ulcer and 1 clip was placed. Colonoscopy unremarkable. No further bleeding or drop in hemoglobin. INR 1.8 on day of discharge.   PAIN:  Are you having pain? No   FALLS: Has patient fallen in last 6 months?  No  LIVING ENVIRONMENT: Lives with: lives with their spouse Lives in: House/apartment  PLOF:  Level of assistance: Independent with ADLs, Independent with IADLs Employment: Retired   PATIENT GOALS   to improve functional independence  SUBJECTIVE STATEMENT: Isaac [name] eager Isaac [name] accompanied by: self  OBJECTIVE:  TODAY'S TREATMENT:   Skilled treatment session focused on Isaac [name]'s cognitive communication goals. SLP facilitated session by providing the following interventions:  Isaac [name] reports eye doctor appt tomorrow to begin assessment for potential  visual field deficits  Discussed safety concerns regarding using power tools (mowing yard and cutting back trees) given visual field deficits - Isaac [name] reports mowing over flowers and landscaping objects - education provided that mowing/use of power tools is not recommended given mild cognitive impairment and visual field deficits  SLP further facilitated session by having Isaac [name] perform semi-complex problem solving task including scanning - Isaac [name] was independent with problem solving given slight increase in time and demonstrated much improved selective attention and awareness  When performing mental manipulation tasks, Isaac [name] was  independent  PATIENT EDUCATION: Education details: see above Person educated: Patient  Education method: Explanation Education comprehension: verbalized understanding   HOME EXERCISE PROGRAM:   Set alarms on phone [facility], see above   GOALS:  Goals reviewed with patient? Yes  SHORT TERM GOALS: Target date: 10 sessions  With Mod A, Isaac [name] will utilize external memory aids to answer questions regarding orientation information with 80% accuracy.  Baseline: Goal status: INITIAL  2.  With Mod A, Isaac [name] will improve word finding by using strategies to produce generative naming list of equipment and vocabulary related to LVAD.  Baseline:  Goal status: INITIAL  3.  Isaac [name] will complete addition cognitive assessment to establish current level of ability.  Baseline: Isaac [name] discharged from CIR requiring Mod A for basic cognitive function.  Goal status: INITIAL   LONG TERM GOALS: Target date: 05/17/2024  With supervision A, Isaac [name] will attend to items on his right to improve safety awareness and accuracy of task completion in 5 out of 7 opportunities.  Baseline:  Goal status: INITIAL  2.  With Min A, Isaac [name] will external and internal memory aides to help short term and prospective memory in 5 out of 7 opportunities.  Baseline:  Goal status: INITIAL  3.  With supervision A, Isaac [name] will be able to verbally sequence (using appropriate vocabulary) LVAD features, alerts and switching battery sources.  Baseline:  Goal status: INITIAL   ASSESSMENT:  CLINICAL IMPRESSION: Patient is a 73 y.o. male LVAD patient, who was seen today for a cognitive communication treatment d/t left cerebellar, left PCA and right frontal gyrus MCA branch infarct. Isaac [name] presents with severe cognitive impairment that is c/b by severe deficits in attention, awareness (Isaac [name] is aware of difficulty/poor performance within tasks but unaware of overall impairment and impact on safety), profound memory deficits in immediate recall, short  term memory, retrieval, visual, auditory and prospective memory decreased awareness of confabulatory responses and severe deficits in executive function as observed during visuospatial tasks, specifically task planning, organization, sequencing with Isaac [name] losing points d/t slow task completion. Overall adequate expressive and receptive communication.   Isaac [name] presents with improved in immediate memory (now within average limits) and attention (now within the average range), low average language abilities (difficulty with verbal fluency tasks; 11 total fruits and vegetables listed within 1 minute). Extremely low visuospatial abilities (continue to recommend evaluation of any visual field deficits by neuro-ophthalmology) as well as extremely low delayed memory abilities (continue to recommend use of external memory aids).    Isaac [name] continues to dmeonstrate improved cognitive communicaiton abiities that are c/b by improved recall, problem solving and ability to self-monitor and self-correct.   Current goals continue to be appropriate with Isaac [name] demonstrating progress in today's session. Isaac [name] continues to be eager and puts forth great effort. See the above treatment note for details.     OBJECTIVE IMPAIRMENTS include attention, memory, awareness, and executive functioning. These impairments are limiting patient from managing medications, managing  appointments, managing finances, household responsibilities, and ADLs/IADLs. Factors affecting potential to achieve goals and functional outcome are ability to learn/carryover information, co-morbidities, medical prognosis, previous level of function, and severity of impairments. Patient will benefit from skilled SLP services to address above impairments and improve overall function.  REHAB POTENTIAL: Good  PLAN: SLP FREQUENCY: 1-2x/week  SLP DURATION: 12 weeks  PLANNED INTERVENTIONS: Language facilitation, Environmental controls, Cueing hierachy, Cognitive reorganization,  Internal/external aids, Functional tasks, SLP instruction and feedback, Compensatory strategies, and Patient/family education   Gaither Biehn B. Rubbie, M.S., CCC-SLP, Tree surgeon Certified Brain Injury Specialist [facility] [place]  [facility] Rehabilitation Services Office [phone] Ascom [phone] Fax [phone]

## 2024-05-11 ENCOUNTER — Ambulatory Visit (INDEPENDENT_AMBULATORY_CARE_PROVIDER_SITE_OTHER)

## 2024-05-11 ENCOUNTER — Ambulatory Visit: Admitting: Speech Pathology

## 2024-05-11 ENCOUNTER — Ambulatory Visit

## 2024-05-11 DIAGNOSIS — I428 Other cardiomyopathies: Secondary | ICD-10-CM

## 2024-05-11 DIAGNOSIS — R41841 Cognitive communication deficit: Secondary | ICD-10-CM | POA: Diagnosis not present

## 2024-05-11 LAB — CUP PACEART REMOTE DEVICE CHECK
Battery Remaining Longevity: 132 mo
Battery Remaining Percentage: 100 %
Brady Statistic RA Percent Paced: 95 %
Brady Statistic RV Percent Paced: 59 %
Date Time Interrogation Session: 20250820070400
HighPow Impedance: 44 Ohm
Implantable Lead Connection Status: 753985
Implantable Lead Connection Status: 753985
Implantable Lead Implant Date: 20140205
Implantable Lead Implant Date: 20140205
Implantable Lead Location: 753859
Implantable Lead Location: 753860
Implantable Lead Model: 296
Implantable Lead Model: 4470
Implantable Lead Serial Number: 121707
Implantable Lead Serial Number: 727876
Implantable Pulse Generator Implant Date: 20250521
Lead Channel Impedance Value: 332 Ohm
Lead Channel Impedance Value: 438 Ohm
Lead Channel Pacing Threshold Amplitude: 0.7 V
Lead Channel Pacing Threshold Amplitude: 1.5 V
Lead Channel Pacing Threshold Pulse Width: 0.4 ms
Lead Channel Pacing Threshold Pulse Width: 0.4 ms
Lead Channel Setting Pacing Amplitude: 2 V
Lead Channel Setting Pacing Amplitude: 3 V
Lead Channel Setting Pacing Pulse Width: 0.4 ms
Lead Channel Setting Sensing Sensitivity: 0.5 mV
Pulse Gen Serial Number: 693113
Zone Setting Status: 755011

## 2024-05-11 NOTE — Therapy (Signed)
 OUTPATIENT SPEECH LANGUAGE PATHOLOGY  COGNITIVE COMMUNICATION TREATMENT NOTE   Patient Name: Isaac [name] MRN: [id number] DOB:March 06, 1951, 73 y.o., male Today's Date: 05/11/2024  PCP: Isaac Null, MD REFERRING PROVIDER: Toribio Pitch, PA   End of Session - 05/11/24 0853     Visit Number 19    Number of Visits 25    Date for SLP Re-Evaluation 05/17/24    Authorization Type [employer] HMO/PPO    Progress Note Due on Visit 20    SLP Start Time [phone]    SLP Stop Time  0930    SLP Time Calculation (min) 40 min    Activity Tolerance Patient tolerated treatment well          Past Medical History:  Diagnosis Date   Arrhythmia    atrial fibrillation   CHF (congestive heart failure) (HCC)    Chronic kidney disease    Coronary artery disease    Hyperlipidemia    Hypertension    Myocardial infarct [facility])    Past Surgical History:  Procedure Laterality Date   CARDIAC DEFIBRILLATOR PLACEMENT  feb 2014   COLONOSCOPY N/A 03/29/2024   Procedure: COLONOSCOPY;  Surgeon: Legrand Victory LITTIE DOUGLAS, MD;  Location: [facility] ENDOSCOPY;  Service: Gastroenterology;  Laterality: N/A;   ENTEROSCOPY N/A 03/22/2024   Procedure: ENTEROSCOPY;  Surgeon: Leigh Elspeth SQUIBB, MD;  Location: [facility] ENDOSCOPY;  Service: Gastroenterology;  Laterality: N/A;   ESOPHAGOGASTRODUODENOSCOPY N/A 03/29/2024   Procedure: EGD (ESOPHAGOGASTRODUODENOSCOPY);  Surgeon: Legrand Victory LITTIE DOUGLAS, MD;  Location: [facility] ENDOSCOPY;  Service: Gastroenterology;  Laterality: N/A;   ICD GENERATOR CHANGEOUT N/A 02/09/2024   Procedure: ICD GENERATOR CHANGEOUT;  Surgeon: Waddell Danelle ORN, MD;  Location: [facility] INVASIVE CV LAB;  Service: Cardiovascular;  Laterality: N/A;   INSERTION OF IMPLANTABLE LEFT VENTRICULAR ASSIST DEVICE N/A 09/17/2021   Procedure: INSERTION OF IMPLANTABLE LEFT VENTRICULAR ASSIST DEVICE AND INSERTION OF FEMORAL ARTERIAL LINE;  Surgeon: Lucas Dorise POUR, MD;  Location: [facility] OR;  Service: Open Heart Surgery;  Laterality: N/A;   IR FLUORO  GUIDE CV LINE RIGHT  09/27/2021   IR THORACENTESIS ASP PLEURAL SPACE W/IMG GUIDE  09/27/2021   IR US  GUIDE VASC ACCESS RIGHT  09/27/2021   PLACEMENT OF IMPELLA LEFT VENTRICULAR ASSIST DEVICE N/A 09/12/2021   Procedure: PLACEMENT OF IMPELLA 5.5 LEFT VENTRICULAR ASSIST DEVICE;  Surgeon: Kerrin Elspeth BROCKS, MD;  Location: [facility] OR;  Service: Open Heart Surgery;  Laterality: N/A;   RIGHT HEART CATH N/A 09/08/2021   Procedure: RIGHT HEART CATH;  Surgeon: Rolan Ezra RAMAN, MD;  Location: [facility] INVASIVE CV LAB;  Service: Cardiovascular;  Laterality: N/A;   TEE WITHOUT CARDIOVERSION N/A 09/12/2021   Procedure: TRANSESOPHAGEAL ECHOCARDIOGRAM (TEE);  Surgeon: Kerrin Elspeth BROCKS, MD;  Location: [facility] OR;  Service: Open Heart Surgery;  Laterality: N/A;   TEE WITHOUT CARDIOVERSION N/A 09/17/2021   Procedure: TRANSESOPHAGEAL ECHOCARDIOGRAM (TEE);  Surgeon: Lucas Dorise POUR, MD;  Location: [facility] OR;  Service: Open Heart Surgery;  Laterality: N/A;   Patient Active Problem List   Diagnosis Date Noted   Acute gastric ulcer with hemorrhage 03/29/2024   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   Melena 03/22/2024   ABLA (acute blood loss anemia) 03/22/2024   Anticoagulated 03/22/2024   AVM (arteriovenous malformation) of small bowel, acquired with hemorrhage 03/22/2024   Upper GI bleed 03/21/2024   Hematoma 02/17/2024   Anemia 02/15/2024   Hypotension 02/15/2024   Sundowning 02/11/2024   Insomnia 02/11/2024   Constipation 02/11/2024   Stage 3b chronic kidney disease (HCC) 02/11/2024  Cardioembolic stroke (HCC) 02/10/2024   Acute left PCA stroke (HCC) 02/05/2024   Abnormal thyroid  blood test 04/05/2023   Abscess of left groin 11/27/2022   VT (ventricular tachycardia) (HCC) 10/27/2022   ICD (implantable cardioverter-defibrillator) in place 10/27/2022   Pressure injury of skin 10/07/2021   LVAD (left ventricular assist device) present (HCC)    Protein-calorie malnutrition, severe 09/10/2021   Elevated TSH    PICC (peripherally  inserted central catheter) in place    Cardiogenic shock (HCC)    Syncope 09/07/2021   CHF (congestive heart failure) (HCC) 09/06/2021   CAD (coronary artery disease) 08/22/2021   Cardiomyopathy (HCC) 08/22/2021   Mixed hyperlipidemia 08/22/2021   A-fib (HCC) 08/22/2021   Elevated troponin 08/22/2021   Acute on chronic combined systolic and diastolic CHF (congestive heart failure) (HCC) 08/22/2021   Benign essential hypertension 02/22/2015   Chronic systolic heart failure (HCC) 07/05/2014    ONSET DATE: 02/05/2024; date of referral  02/22/2024  REFERRING DIAG: P36.467 (ICD-10-CM) - Cerebral infarction due to unspecified occlusion or stenosis of left posterior cerebral artery  THERAPY DIAG:  Cognitive communication deficit  Rationale for Evaluation and Treatment Rehabilitation  SUBJECTIVE:   PERTINENT HISTORY and DIAGNOSTIC FINDINGS:  [name] is a 73 y.o. with history of CAD, HTN, atrial fibrillation, CKD IV, VT and chronic systolic CHFs/p HM-3 LVAD on 09/17/21.  He presented to the [facility] ED on 02/05/24 for focal neuro deficits with R vision field cut and dysmetria. Code Stroke activated. CT brain positive for acute to subacute appearing Left PCA and Left cerebellar infarcts. Hyperdense Left PCA suggests occlusion of that vessel. No hemorrhagic transformation or mass effect. Seen by Neuro and decision made to treat with IV heparin . Echocardiogram shows EF 25 to 30% with LVAD at the apex.  Left atrial is moderately dilate. CT angio distal left P2/P3 occlusion.Repeat CT head 02/06/24 with evolving left cerebellar, left PCA and right frontal gyrus MCA branch infarct. No hemorrhage.  Neurology feels infarcts 2/2 LVAD and signed off  Pt attended [facility] CIR from until 02/18/2024, received ST services targeting cognitive linguistic abilities and discharged at MOD A for cognitive linguistic skills. Memory/orientation deficits remain the pt's biggest barrier to progress and  overall safety.   He was admitted on 03/21/2024 from the office for GI bleed with hemoglobin down to 6.4. Coumadin  was held. He was placed on PPI. GI was consulted and he was transfused multiple PRBCs (total of 7 units this admission). He underwent EGD on 7/2 with friable tissue in the duodenum and 4 AVMS which were treated with APC. INR goal was adjusted to 2-2.5. Unfortunately, he continued to have melena and his hemoglobin continued to drop. He underwent repeat EGD + colonoscopy on 7/9  which showed 1 non-bleeding superficial gastric ulcer and 1 clip was placed. Colonoscopy unremarkable. No further bleeding or drop in hemoglobin. INR 1.8 on day of discharge.   PAIN:  Are you having pain? No   FALLS: Has patient fallen in last 6 months?  No  LIVING ENVIRONMENT: Lives with: lives with their spouse Lives in: House/apartment  PLOF:  Level of assistance: Independent with ADLs, Independent with IADLs Employment: Retired   PATIENT GOALS   to improve functional independence  SUBJECTIVE STATEMENT: Pt reports going to the eye doctor Pt accompanied by: self  OBJECTIVE:  TODAY'S TREATMENT:   Skilled treatment session focused on pt's cognitive communication goals. SLP facilitated session by providing the following interventions:  Pt reports that eye doctor and  having good eye health, increase in eye glass prescription, continues to have an appt with neurologist in September - per his wife's report, neuro-ophthalmologist was not recommended by eye doctor  He also reports making a cake and brought some in to share - very good progress. His wife also confirms cognitive progress.   Pt was independent with semi-complex visual scanning problem solving task with extra time provided.   PATIENT EDUCATION: Education details: see above Person educated: Patient  Education method: Explanation Education comprehension: verbalized understanding   HOME EXERCISE PROGRAM:   Set alarms on  phone [employer] Therapy, see above   GOALS:  Goals reviewed with patient? Yes  SHORT TERM GOALS: Target date: 10 sessions  With Mod A, pt will utilize external memory aids to answer questions regarding orientation information with 80% accuracy.  Baseline: Goal status: INITIAL  2.  With Mod A, pt will improve word finding by using strategies to produce generative naming list of equipment and vocabulary related to LVAD.  Baseline:  Goal status: INITIAL  3.  Pt will complete addition cognitive assessment to establish current level of ability.  Baseline: Pt discharged from CIR requiring Mod A for basic cognitive function.  Goal status: INITIAL   LONG TERM GOALS: Target date: 05/17/2024  With supervision A, pt will attend to items on his right to improve safety awareness and accuracy of task completion in 5 out of 7 opportunities.  Baseline:  Goal status: INITIAL  2.  With Min A, pt will external and internal memory aides to help short term and prospective memory in 5 out of 7 opportunities.  Baseline:  Goal status: INITIAL  3.  With supervision A, pt will be able to verbally sequence (using appropriate vocabulary) LVAD features, alerts and switching battery sources.  Baseline:  Goal status: INITIAL   ASSESSMENT:  CLINICAL IMPRESSION: Patient is a 73 y.o. male LVAD patient, who was seen today for a cognitive communication treatment d/t left cerebellar, left PCA and right frontal gyrus MCA branch infarct. Pt presents with severe cognitive impairment that is c/b by severe deficits in attention, awareness (pt is aware of difficulty/poor performance within tasks but unaware of overall impairment and impact on safety), profound memory deficits in immediate recall, short term memory, retrieval, visual, auditory and prospective memory decreased awareness of confabulatory responses and severe deficits in executive function as observed during visuospatial tasks, specifically task planning,  organization, sequencing with pt losing points d/t slow task completion. Overall adequate expressive and receptive communication.   Pt presents with improved in immediate memory (now within average limits) and attention (now within the average range), low average language abilities (difficulty with verbal fluency tasks; 11 total fruits and vegetables listed within 1 minute). Extremely low visuospatial abilities (continue to recommend evaluation of any visual field deficits by neuro-ophthalmology) as well as extremely low delayed memory abilities (continue to recommend use of external memory aids).    Pt continues to dmeonstrate improved cognitive communicaiton abiities that are c/b by improved recall, problem solving and ability to self-monitor and self-correct.   Current goals continue to be appropriate with pt demonstrating progress in today's session. Pt continues to be eager and puts forth great effort. See the above treatment note for details.     OBJECTIVE IMPAIRMENTS include attention, memory, awareness, and executive functioning. These impairments are limiting patient from managing medications, managing appointments, managing finances, household responsibilities, and ADLs/IADLs. Factors affecting potential to achieve goals and functional outcome are ability to learn/carryover information, co-morbidities, medical  prognosis, previous level of function, and severity of impairments. Patient will benefit from skilled SLP services to address above impairments and improve overall function.  REHAB POTENTIAL: Good  PLAN: SLP FREQUENCY: 1-2x/week  SLP DURATION: 12 weeks  PLANNED INTERVENTIONS: Language facilitation, Environmental controls, Cueing hierachy, Cognitive reorganization, Internal/external aids, Functional tasks, SLP instruction and feedback, Compensatory strategies, and Patient/family education   Margean Korell B. Rubbie, M.S., CCC-SLP, Tree surgeon Certified Brain  Injury Specialist [facility]  [facility] Rehabilitation Services Office [phone] Ascom [phone] Fax [phone]

## 2024-05-12 ENCOUNTER — Encounter: Payer: Self-pay | Admitting: Student

## 2024-05-12 ENCOUNTER — Ambulatory Visit: Attending: Student | Admitting: Student

## 2024-05-12 VITALS — BP 103/73 | HR 60 | Ht 72.0 in | Wt 174.0 lb

## 2024-05-12 DIAGNOSIS — I5022 Chronic systolic (congestive) heart failure: Secondary | ICD-10-CM | POA: Diagnosis not present

## 2024-05-12 DIAGNOSIS — Z95811 Presence of heart assist device: Secondary | ICD-10-CM | POA: Diagnosis not present

## 2024-05-12 DIAGNOSIS — I472 Ventricular tachycardia, unspecified: Secondary | ICD-10-CM | POA: Diagnosis not present

## 2024-05-12 DIAGNOSIS — I429 Cardiomyopathy, unspecified: Secondary | ICD-10-CM | POA: Diagnosis not present

## 2024-05-12 LAB — CUP PACEART INCLINIC DEVICE CHECK
Date Time Interrogation Session: 20250822142318
HighPow Impedance: 45 Ohm
Implantable Lead Connection Status: 753985
Implantable Lead Connection Status: 753985
Implantable Lead Implant Date: 20140205
Implantable Lead Implant Date: 20140205
Implantable Lead Location: 753859
Implantable Lead Location: 753860
Implantable Lead Model: 296
Implantable Lead Model: 4470
Implantable Lead Serial Number: 121707
Implantable Lead Serial Number: 727876
Implantable Pulse Generator Implant Date: 20250521
Lead Channel Impedance Value: 331 Ohm
Lead Channel Impedance Value: 440 Ohm
Lead Channel Pacing Threshold Amplitude: 0.9 V
Lead Channel Pacing Threshold Amplitude: 1.7 V
Lead Channel Pacing Threshold Pulse Width: 0.4 ms
Lead Channel Pacing Threshold Pulse Width: 1 ms
Lead Channel Sensing Intrinsic Amplitude: 2.1 mV
Lead Channel Sensing Intrinsic Amplitude: 3.8 mV
Lead Channel Setting Pacing Amplitude: 2 V
Lead Channel Setting Pacing Amplitude: 3.5 V
Lead Channel Setting Pacing Pulse Width: 1 ms
Lead Channel Setting Sensing Sensitivity: 0.5 mV
Pulse Gen Serial Number: 693113
Zone Setting Status: 755011

## 2024-05-12 LAB — POCT INR: INR: 2.1 (ref 2.0–3.0)

## 2024-05-12 NOTE — Patient Instructions (Signed)
 Medication Instructions:  Your physician recommends that you continue on your current medications as directed. Please refer to the Current Medication list given to you today.  *If you need a refill on your cardiac medications before your next appointment, please call your pharmacy*  Lab Work: None ordered If you have labs (blood work) drawn today and your tests are completely normal, you will receive your results only by: MyChart Message (if you have MyChart) OR A paper copy in the mail If you have any lab test that is abnormal or we need to change your treatment, we will call you to review the results.  Follow-Up: At [facility], you and your health needs are our priority.  As part of our continuing mission to provide you with exceptional heart care, our providers are all part of one team.  This team includes your primary Cardiologist (physician) and Advanced Practice Providers or APPs (Physician Assistants and Nurse Practitioners) who all work together to provide you with the care you need, when you need it.  Your next appointment:   6 month(s)  Provider:   Bambi Lever "Michaelle Adolphus, PA-C

## 2024-05-15 ENCOUNTER — Ambulatory Visit (HOSPITAL_COMMUNITY): Payer: Self-pay | Admitting: Pharmacist

## 2024-05-16 ENCOUNTER — Encounter

## 2024-05-16 ENCOUNTER — Ambulatory Visit: Admitting: Speech Pathology

## 2024-05-16 ENCOUNTER — Ambulatory Visit

## 2024-05-16 DIAGNOSIS — R41841 Cognitive communication deficit: Secondary | ICD-10-CM | POA: Diagnosis not present

## 2024-05-16 NOTE — Therapy (Signed)
 OUTPATIENT SPEECH LANGUAGE PATHOLOGY  COGNITIVE COMMUNICATION TREATMENT NOTE 10th Visit Progress Note RE-CERTIFICATION REQUEST   Patient Name: Isaac [name] MRN: [id number] DOB:March 04, 1951, 73 y.o., male Today's Date: 05/16/2024  PCP: Isaac Null, MD REFERRING PROVIDER: Toribio Pitch, PA  Speech Therapy Progress Note  Dates of Reporting Period: 04/12/24 to 05/16/24  Objective: Patient has been seen for 10 speech therapy sessions this reporting period targeting pt's cognitive communication impairment. Patient is making progress toward LTGs and met 3 STGs this reporting period. See skilled intervention, clinical impressions, and goals below for details.    End of Session - 05/16/24 1144     Visit Number 20    Number of Visits 25    Date for SLP Re-Evaluation 07/11/24    Authorization Type [employer] HMO/PPO    Progress Note Due on Visit 20    SLP Start Time 0935    SLP Stop Time  1015    SLP Time Calculation (min) 40 min    Activity Tolerance Patient tolerated treatment well          Past Medical History:  Diagnosis Date   Arrhythmia    atrial fibrillation   CHF (congestive heart failure) (HCC)    Chronic kidney disease    Coronary artery disease    Hyperlipidemia    Hypertension    Myocardial infarct [facility])    Past Surgical History:  Procedure Laterality Date   CARDIAC DEFIBRILLATOR PLACEMENT  feb 2014   COLONOSCOPY N/A 03/29/2024   Procedure: COLONOSCOPY;  Surgeon: Legrand Victory LITTIE DOUGLAS, MD;  Location: [facility] ENDOSCOPY;  Service: Gastroenterology;  Laterality: N/A;   ENTEROSCOPY N/A 03/22/2024   Procedure: ENTEROSCOPY;  Surgeon: Leigh Elspeth SQUIBB, MD;  Location: [facility] ENDOSCOPY;  Service: Gastroenterology;  Laterality: N/A;   ESOPHAGOGASTRODUODENOSCOPY N/A 03/29/2024   Procedure: EGD (ESOPHAGOGASTRODUODENOSCOPY);  Surgeon: Legrand Victory LITTIE DOUGLAS, MD;  Location: [facility] ENDOSCOPY;  Service: Gastroenterology;  Laterality: N/A;   ICD GENERATOR CHANGEOUT N/A 02/09/2024    Procedure: ICD GENERATOR CHANGEOUT;  Surgeon: Waddell Danelle ORN, MD;  Location: [facility] INVASIVE CV LAB;  Service: Cardiovascular;  Laterality: N/A;   INSERTION OF IMPLANTABLE LEFT VENTRICULAR ASSIST DEVICE N/A 09/17/2021   Procedure: INSERTION OF IMPLANTABLE LEFT VENTRICULAR ASSIST DEVICE AND INSERTION OF FEMORAL ARTERIAL LINE;  Surgeon: Lucas Dorise POUR, MD;  Location: [facility] OR;  Service: Open Heart Surgery;  Laterality: N/A;   IR FLUORO GUIDE CV LINE RIGHT  09/27/2021   IR THORACENTESIS ASP PLEURAL SPACE W/IMG GUIDE  09/27/2021   IR US  GUIDE VASC ACCESS RIGHT  09/27/2021   PLACEMENT OF IMPELLA LEFT VENTRICULAR ASSIST DEVICE N/A 09/12/2021   Procedure: PLACEMENT OF IMPELLA 5.5 LEFT VENTRICULAR ASSIST DEVICE;  Surgeon: Kerrin Elspeth BROCKS, MD;  Location: [facility] OR;  Service: Open Heart Surgery;  Laterality: N/A;   RIGHT HEART CATH N/A 09/08/2021   Procedure: RIGHT HEART CATH;  Surgeon: Rolan Ezra RAMAN, MD;  Location: [facility] INVASIVE CV LAB;  Service: Cardiovascular;  Laterality: N/A;   TEE WITHOUT CARDIOVERSION N/A 09/12/2021   Procedure: TRANSESOPHAGEAL ECHOCARDIOGRAM (TEE);  Surgeon: Kerrin Elspeth BROCKS, MD;  Location: [facility] OR;  Service: Open Heart Surgery;  Laterality: N/A;   TEE WITHOUT CARDIOVERSION N/A 09/17/2021   Procedure: TRANSESOPHAGEAL ECHOCARDIOGRAM (TEE);  Surgeon: Lucas Dorise POUR, MD;  Location: [facility] OR;  Service: Open Heart Surgery;  Laterality: N/A;   Patient Active Problem List   Diagnosis Date Noted   Acute gastric ulcer with hemorrhage 03/29/2024   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   Melena 03/22/2024  ABLA (acute blood loss anemia) 03/22/2024   Anticoagulated 03/22/2024   AVM (arteriovenous malformation) of small bowel, acquired with hemorrhage 03/22/2024   Upper GI bleed 03/21/2024   Hematoma 02/17/2024   Anemia 02/15/2024   Hypotension 02/15/2024   Sundowning 02/11/2024   Insomnia 02/11/2024   Constipation 02/11/2024   Stage 3b chronic kidney disease (HCC) 02/11/2024   Cardioembolic stroke (HCC)  02/10/2024   Acute left PCA stroke (HCC) 02/05/2024   Abnormal thyroid  blood test 04/05/2023   Abscess of left groin 11/27/2022   VT (ventricular tachycardia) (HCC) 10/27/2022   ICD (implantable cardioverter-defibrillator) in place 10/27/2022   Pressure injury of skin 10/07/2021   LVAD (left ventricular assist device) present (HCC)    Protein-calorie malnutrition, severe 09/10/2021   Elevated TSH    PICC (peripherally inserted central catheter) in place    Cardiogenic shock (HCC)    Syncope 09/07/2021   CHF (congestive heart failure) (HCC) 09/06/2021   CAD (coronary artery disease) 08/22/2021   Cardiomyopathy (HCC) 08/22/2021   Mixed hyperlipidemia 08/22/2021   A-fib (HCC) 08/22/2021   Elevated troponin 08/22/2021   Acute on chronic combined systolic and diastolic CHF (congestive heart failure) (HCC) 08/22/2021   Benign essential hypertension 02/22/2015   Chronic systolic heart failure (HCC) 07/05/2014    ONSET DATE: 02/05/2024; date of referral  02/22/2024  REFERRING DIAG: P36.467 (ICD-10-CM) - Cerebral infarction due to unspecified occlusion or stenosis of left posterior cerebral artery  THERAPY DIAG:  Cognitive communication deficit  Rationale for Evaluation and Treatment Rehabilitation  SUBJECTIVE:   PERTINENT HISTORY and DIAGNOSTIC FINDINGS:  Mr. Isaac [name] is a 73 y.o. with history of CAD, HTN, atrial fibrillation, CKD IV, VT and chronic systolic CHFs/p HM-3 LVAD on 09/17/21.  He presented to the [facility] ED on 02/05/24 for focal neuro deficits with R vision field cut and dysmetria. Code Stroke activated. CT brain positive for acute to subacute appearing Left PCA and Left cerebellar infarcts. Hyperdense Left PCA suggests occlusion of that vessel. No hemorrhagic transformation or mass effect. Seen by Neuro and decision made to treat with IV heparin . Echocardiogram shows EF 25 to 30% with LVAD at the apex.  Left atrial is moderately dilate. CT angio distal left  P2/P3 occlusion.Repeat CT head 02/06/24 with evolving left cerebellar, left PCA and right frontal gyrus MCA branch infarct. No hemorrhage.  Neurology feels infarcts 2/2 LVAD and signed off  Pt attended [facility] CIR from until 02/18/2024, received ST services targeting cognitive linguistic abilities and discharged at MOD [facility] for cognitive linguistic skills. Memory/orientation deficits remain the pt's biggest barrier to progress and overall safety.   He was admitted on 03/21/2024 from the office for GI bleed with hemoglobin down to 6.4. Coumadin  was held. He was placed on PPI. GI was consulted and he was transfused multiple PRBCs (total of 7 units this admission). He underwent EGD on 7/2 with friable tissue in the duodenum and 4 AVMS which were treated with APC. INR goal was adjusted to 2-2.5. Unfortunately, he continued to have melena and his hemoglobin continued to drop. He underwent repeat EGD + colonoscopy on 7/9  which showed 1 non-bleeding superficial gastric ulcer and 1 clip was placed. Colonoscopy unremarkable. No further bleeding or drop in hemoglobin. INR 1.8 on day of discharge.   PAIN:  Are you having pain? No   FALLS: Has patient fallen in last 6 months?  No  LIVING ENVIRONMENT: Lives with: lives with their spouse Lives in: House/apartment  PLOF:  Level of assistance:  Independent with ADLs, Independent with IADLs Employment: Retired   PATIENT GOALS   to improve functional independence  SUBJECTIVE STATEMENT: Pt attended session with his wife Pt accompanied by: self  OBJECTIVE:  TODAY'S TREATMENT:   Skilled treatment session focused on pt's cognitive communication goals. SLP facilitated session by providing the following interventions:  When completing a navigation task, pt was Mod I for divided attention between moderately distracting environment, talking and following written directions. He benefited from maximal A to achieve 100% recall of items to locate in gift shop (he was  able to recall 50% of items independently) - continue to recommend pt write down information lastly after receiving initial Min A to refer to written directions, he was independent in following them.    The Neuro-QOLT Item Bank v2.0-Cognition Function-Short Form is an eight-item test designed to measure difficulties with cognitive functioning (e.g., memory, attention and decision making or in the application of such abilities to everyday tasks (e.g., planning, organizing, calculating, remembering and learning). Source: Meg CHARM Haber, J-S, et al. (2012). Neuro-QOL: brief measures of health-related quality of life for clinical research in neurology. Neurology, 78(23), [phone].   In the past 7 days...  I had to read something several times to understand it. 4- Rarely (once)  My thinking was slow. 4- Rarely (once)  I had to work really hard to pay attention or I would make a mistake. 5- Never I had trouble concentrating. 5- Never  How much DIFFICULTY do you currently have...  Reading and following complex instructions (e.g., directions for a new medication)? 5- None  Planning for and keeping appointments that are not part of your weekly routine (e.g., a therapy or doctor appointment, or a social gathering with friends and family)? 5- None  Managing your time to do most of your daily activities? 5- None  Learning new tasks or instructions? 5- None   T-SCORE: 56.3;  .5 SD above mean of 50    PATIENT EDUCATION: Education details: see above Person educated: Patient  Education method: Explanation Education comprehension: verbalized understanding   HOME EXERCISE PROGRAM:   Set alarms on phone [facility], see above   GOALS:  Goals reviewed with patient? Yes  SHORT TERM GOALS: Target date: 10 sessions  Updated 05/16/24 With Mod A, pt will utilize external memory aids to answer questions regarding orientation information with 80% accuracy.  Baseline: Goal status: INITIAL:  MET  2.  With Mod A, pt will improve word finding by using strategies to produce generative naming list of equipment and vocabulary related to LVAD.  Baseline:  Goal status: INITIAL: MET  3.  Pt will complete addition cognitive assessment to establish current level of ability.  Baseline: Pt discharged from CIR requiring Mod A for basic cognitive function.  Goal status: INITIAL: MET  4. With Supervision A, patient will demonstrate knowledge of appropriate activities to support cognitive and  language function outside of ST with assistance from family.    Baseline:  Goal status: INITIAL   LONG TERM GOALS: Target date: 07/11/2024  Updated: 05/16/2024  With supervision A, pt will attend to items on his right to improve safety awareness and accuracy of task completion in 5 out of 7 opportunities.  Baseline:  Goal status: INITIAL: Progress made, he continues to require supervision for cues - continue to recommend follow up with neuro-ophthalmologist   2.  With Min A, pt will external and internal memory aides to help short term and prospective memory in 5 out of 7  opportunities.  Baseline:  Goal status: INITIAL: Progress made, pt requires cues to use strategies, he is able to state that he needs to use compensatory strategies but doesn't engage in using them  3.  With supervision A, pt will be able to verbally sequence (using appropriate vocabulary) LVAD features, alerts and switching battery sources.  Baseline:  Goal status: INITIAL: MET   ASSESSMENT:  CLINICAL IMPRESSION: Patient is a 73 y.o. male LVAD patient, who was seen today for a cognitive communication treatment d/t left cerebellar, left PCA and right frontal gyrus MCA branch infarct. Pt presents with severe cognitive impairment that is c/b by severe deficits in attention, awareness (pt is aware of difficulty/poor performance within tasks but unaware of overall impairment and impact on safety), profound memory deficits in  immediate recall, short term memory, retrieval, visual, auditory and prospective memory decreased awareness of confabulatory responses and severe deficits in executive function as observed during visuospatial tasks, specifically task planning, organization, sequencing with pt losing points d/t slow task completion. Overall adequate expressive and receptive communication.   Pt presents with improved in immediate memory (now within average limits) and attention (now within the average range), low average language abilities (difficulty with verbal fluency tasks; 11 total fruits and vegetables listed within 1 minute). Extremely low visuospatial abilities (continue to recommend evaluation of any visual field deficits by neuro-ophthalmology) as well as extremely low delayed memory abilities (continue to recommend use of external memory aids).    Pt continues to dmeonstrate improved cognitive communicaiton abiities that are c/b by improved recall, problem solving and ability to self-monitor and self-correct.   Current goals continue to be appropriate with pt demonstrating progress in today's session. Pt continues to be eager and puts forth great effort. See the above treatment note for details.     OBJECTIVE IMPAIRMENTS include attention, memory, awareness, and executive functioning. These impairments are limiting patient from managing medications, managing appointments, managing finances, household responsibilities, and ADLs/IADLs. Factors affecting potential to achieve goals and functional outcome are ability to learn/carryover information, co-morbidities, medical prognosis, previous level of function, and severity of impairments. Patient will benefit from skilled SLP services to address above impairments and improve overall function.  REHAB POTENTIAL: Good  PLAN: SLP FREQUENCY: 1-2x/week  SLP DURATION: 12 weeks  PLANNED INTERVENTIONS: Language facilitation, Environmental controls, Cueing hierachy,  Cognitive reorganization, Internal/external aids, Functional tasks, SLP instruction and feedback, Compensatory strategies, and Patient/family education   Happi B. Rubbie, M.S., CCC-SLP, Tree surgeon Certified Brain Injury Specialist [facility]  [facility] Office [phone] Ascom [phone] Fax [phone]

## 2024-05-18 ENCOUNTER — Ambulatory Visit

## 2024-05-18 ENCOUNTER — Encounter

## 2024-05-18 ENCOUNTER — Ambulatory Visit: Admitting: Speech Pathology

## 2024-05-18 DIAGNOSIS — R41841 Cognitive communication deficit: Secondary | ICD-10-CM | POA: Diagnosis not present

## 2024-05-18 NOTE — Therapy (Addendum)
 OUTPATIENT SPEECH LANGUAGE PATHOLOGY  COGNITIVE COMMUNICATION TREATMENT NOTE   Patient Name: Isaac [name] MRN: [id number] DOB:06-Dec-1950, 73 y.o., male Today's Date: 05/18/2024  PCP: Lynwood Null, MD REFERRING PROVIDER: Toribio Pitch, PA    End of Session - 05/18/24 0846     Visit Number 21    Number of Visits 25    Date for SLP Re-Evaluation 07/11/24    Authorization Type [employer] HMO/PPO    Progress Note Due on Visit 20    SLP Start Time [phone]    SLP Stop Time  0930    SLP Time Calculation (min) 41 min    Activity Tolerance Patient tolerated treatment well          Past Medical History:  Diagnosis Date   Arrhythmia    atrial fibrillation   CHF (congestive heart failure) (HCC)    Chronic kidney disease    Coronary artery disease    Hyperlipidemia    Hypertension    Myocardial infarct [facility])    Past Surgical History:  Procedure Laterality Date   CARDIAC DEFIBRILLATOR PLACEMENT  feb 2014   COLONOSCOPY N/A 03/29/2024   Procedure: COLONOSCOPY;  Surgeon: Legrand Victory LITTIE DOUGLAS, MD;  Location: [facility] ENDOSCOPY;  Service: Gastroenterology;  Laterality: N/A;   ENTEROSCOPY N/A 03/22/2024   Procedure: ENTEROSCOPY;  Surgeon: Leigh Elspeth SQUIBB, MD;  Location: [facility] ENDOSCOPY;  Service: Gastroenterology;  Laterality: N/A;   ESOPHAGOGASTRODUODENOSCOPY N/A 03/29/2024   Procedure: EGD (ESOPHAGOGASTRODUODENOSCOPY);  Surgeon: Legrand Victory LITTIE DOUGLAS, MD;  Location: [facility] ENDOSCOPY;  Service: Gastroenterology;  Laterality: N/A;   ICD GENERATOR CHANGEOUT N/A 02/09/2024   Procedure: ICD GENERATOR CHANGEOUT;  Surgeon: Waddell Danelle ORN, MD;  Location: [facility] INVASIVE CV LAB;  Service: Cardiovascular;  Laterality: N/A;   INSERTION OF IMPLANTABLE LEFT VENTRICULAR ASSIST DEVICE N/A 09/17/2021   Procedure: INSERTION OF IMPLANTABLE LEFT VENTRICULAR ASSIST DEVICE AND INSERTION OF FEMORAL ARTERIAL LINE;  Surgeon: Lucas Dorise POUR, MD;  Location: [facility] OR;  Service: Open Heart Surgery;  Laterality: N/A;   IR  FLUORO GUIDE CV LINE RIGHT  09/27/2021   IR THORACENTESIS ASP PLEURAL SPACE W/IMG GUIDE  09/27/2021   IR US  GUIDE VASC ACCESS RIGHT  09/27/2021   PLACEMENT OF IMPELLA LEFT VENTRICULAR ASSIST DEVICE N/A 09/12/2021   Procedure: PLACEMENT OF IMPELLA 5.5 LEFT VENTRICULAR ASSIST DEVICE;  Surgeon: Kerrin Elspeth BROCKS, MD;  Location: [facility] OR;  Service: Open Heart Surgery;  Laterality: N/A;   RIGHT HEART CATH N/A 09/08/2021   Procedure: RIGHT HEART CATH;  Surgeon: Rolan Ezra RAMAN, MD;  Location: [facility] INVASIVE CV LAB;  Service: Cardiovascular;  Laterality: N/A;   TEE WITHOUT CARDIOVERSION N/A 09/12/2021   Procedure: TRANSESOPHAGEAL ECHOCARDIOGRAM (TEE);  Surgeon: Kerrin Elspeth BROCKS, MD;  Location: [facility] OR;  Service: Open Heart Surgery;  Laterality: N/A;   TEE WITHOUT CARDIOVERSION N/A 09/17/2021   Procedure: TRANSESOPHAGEAL ECHOCARDIOGRAM (TEE);  Surgeon: Lucas Dorise POUR, MD;  Location: [facility] OR;  Service: Open Heart Surgery;  Laterality: N/A;   Patient Active Problem List   Diagnosis Date Noted   Acute gastric ulcer with hemorrhage 03/29/2024   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   Melena 03/22/2024   ABLA (acute blood loss anemia) 03/22/2024   Anticoagulated 03/22/2024   AVM (arteriovenous malformation) of small bowel, acquired with hemorrhage 03/22/2024   Upper GI bleed 03/21/2024   Hematoma 02/17/2024   Anemia 02/15/2024   Hypotension 02/15/2024   Sundowning 02/11/2024   Insomnia 02/11/2024   Constipation 02/11/2024   Stage 3b chronic kidney disease (HCC) 02/11/2024  Cardioembolic stroke (HCC) 02/10/2024   Acute left PCA stroke (HCC) 02/05/2024   Abnormal thyroid  blood test 04/05/2023   Abscess of left groin 11/27/2022   VT (ventricular tachycardia) (HCC) 10/27/2022   ICD (implantable cardioverter-defibrillator) in place 10/27/2022   Pressure injury of skin 10/07/2021   LVAD (left ventricular assist device) present (HCC)    Protein-calorie malnutrition, severe 09/10/2021   Elevated TSH    PICC  (peripherally inserted central catheter) in place    Cardiogenic shock (HCC)    Syncope 09/07/2021   CHF (congestive heart failure) (HCC) 09/06/2021   CAD (coronary artery disease) 08/22/2021   Cardiomyopathy (HCC) 08/22/2021   Mixed hyperlipidemia 08/22/2021   A-fib (HCC) 08/22/2021   Elevated troponin 08/22/2021   Acute on chronic combined systolic and diastolic CHF (congestive heart failure) (HCC) 08/22/2021   Benign essential hypertension 02/22/2015   Chronic systolic heart failure (HCC) 07/05/2014    ONSET DATE: 02/05/2024; date of referral  02/22/2024  REFERRING DIAG: P36.467 (ICD-10-CM) - Cerebral infarction due to unspecified occlusion or stenosis of left posterior cerebral artery  THERAPY DIAG:  Cognitive communication deficit  Rationale for Evaluation and Treatment Rehabilitation  SUBJECTIVE:   PERTINENT HISTORY and DIAGNOSTIC FINDINGS:  Isaac [name] is a 73 y.o. with history of CAD, HTN, atrial fibrillation, CKD IV, VT and chronic systolic CHFs/p HM-3 LVAD on 09/17/21.  He presented to the [facility] ED on 02/05/24 for focal neuro deficits with R vision field cut and dysmetria. Code Stroke activated. CT brain positive for acute to subacute appearing Left PCA and Left cerebellar infarcts. Hyperdense Left PCA suggests occlusion of that vessel. No hemorrhagic transformation or mass effect. Seen by Neuro and decision made to treat with IV heparin . Echocardiogram shows EF 25 to 30% with LVAD at the apex.  Left atrial is moderately dilate. CT angio distal left P2/P3 occlusion.Repeat CT head 02/06/24 with evolving left cerebellar, left PCA and right frontal gyrus MCA branch infarct. No hemorrhage.  Neurology feels infarcts 2/2 LVAD and signed off  Pt attended [facility] CIR from until 02/18/2024, received ST services targeting cognitive linguistic abilities and discharged at MOD A for cognitive linguistic skills. Memory/orientation deficits remain the pt's biggest barrier to  progress and overall safety.   He was admitted on 03/21/2024 from the office for GI bleed with hemoglobin down to 6.4. Coumadin  was held. He was placed on PPI. GI was consulted and he was transfused multiple PRBCs (total of 7 units this admission). He underwent EGD on 7/2 with friable tissue in the duodenum and 4 AVMS which were treated with APC. INR goal was adjusted to 2-2.5. Unfortunately, he continued to have melena and his hemoglobin continued to drop. He underwent repeat EGD + colonoscopy on 7/9  which showed 1 non-bleeding superficial gastric ulcer and 1 clip was placed. Colonoscopy unremarkable. No further bleeding or drop in hemoglobin. INR 1.8 on day of discharge.   PAIN:  Are you having pain? No   FALLS: Has patient fallen in last 6 months?  No  LIVING ENVIRONMENT: Lives with: lives with their spouse Lives in: House/apartment  PLOF:  Level of assistance: Independent with ADLs, Independent with IADLs Employment: Retired   PATIENT GOALS   to improve functional independence  SUBJECTIVE STATEMENT: Pt attended session with his wife Pt accompanied by: self, wife  OBJECTIVE:  TODAY'S TREATMENT:   Skilled treatment session focused on pt's cognitive communication goals. SLP facilitated session by providing the following interventions:  Korebalance was introduced to increase the  cognitive load as the pt balanced while working through predictable and unpredictable tasks (target identification, counting by 5's, alphabet). Tasks targeted pt's working memory, scanning, and alternating attention.    Target identification within 30 seconds: 1st attempt pt identified: 25 targets; 2nd: attempt pt identified 29 targets - all largely on pt's left field of vision Counting by 5's within 30 seconds: 1st attempt 6 identified; 2nd attempt 5 identified; Pt cued to scan each row to the end to increase accuracy - time increased to 90 seconds to allow pt more opportunities to scan - pt able to  identify 19  and accuracy time increased to 90s. 3rd: 19 identified Alphabet: 1st attempt (90s): 14 identified; 2nd attempt 15 identified with verbal cue to direct attention to which letter was next and to scan all the way to the right  Education provided on increased success with visual scanning when information is predictible (closed set) vs unpredictable informaiton (unclosed set).  Instruction pt to participate in more activities such as grocery shopping with his wife and pt locating items as well as performing tasks such as washing dishes, folding clothes  PATIENT EDUCATION: Education details: see above Person educated: Patient  Education method: Explanation Education comprehension: verbalized understanding   HOME EXERCISE PROGRAM:   Set alarms on phone [facility], see above   GOALS:  Goals reviewed with patient? Yes  SHORT TERM GOALS: Target date: 10 sessions  Updated 05/16/24 With Mod A, pt will utilize external memory aids to answer questions regarding orientation information with 80% accuracy.  Baseline: Goal status: INITIAL: MET  2.  With Mod A, pt will improve word finding by using strategies to produce generative naming list of equipment and vocabulary related to LVAD.  Baseline:  Goal status: INITIAL: MET  3.  Pt will complete addition cognitive assessment to establish current level of ability.  Baseline: Pt discharged from CIR requiring Mod A for basic cognitive function.  Goal status: INITIAL: MET  4. With Supervision A, patient will demonstrate knowledge of appropriate activities to support cognitive and  language function outside of ST with assistance from family.    Baseline:  Goal status: INITIAL   LONG TERM GOALS: Target date: 07/11/2024  Updated: 05/16/2024  With supervision A, pt will attend to items on his right to improve safety awareness and accuracy of task completion in 5 out of 7 opportunities.  Baseline:  Goal status: INITIAL: Progress  made, he continues to require supervision for cues - continue to recommend follow up with neuro-ophthalmologist   2.  With Min A, pt will external and internal memory aides to help short term and prospective memory in 5 out of 7 opportunities.  Baseline:  Goal status: INITIAL: Progress made, pt requires cues to use strategies, he is able to state that he needs to use compensatory strategies but doesn't engage in using them  3.  With supervision A, pt will be able to verbally sequence (using appropriate vocabulary) LVAD features, alerts and switching battery sources.  Baseline:  Goal status: INITIAL: MET   ASSESSMENT:  CLINICAL IMPRESSION: Patient is a 73 y.o. male LVAD patient, who was seen today for a cognitive communication treatment d/t left cerebellar, left PCA and right frontal gyrus MCA branch infarct. Pt presents with severe cognitive impairment that is c/b by severe deficits in attention, awareness (pt is aware of difficulty/poor performance within tasks but unaware of overall impairment and impact on safety), profound memory deficits in immediate recall, short term memory, retrieval, visual, auditory  and prospective memory decreased awareness of confabulatory responses and severe deficits in executive function as observed during visuospatial tasks, specifically task planning, organization, sequencing with pt losing points d/t slow task completion. Overall adequate expressive and receptive communication.   Pt presents with improved in immediate memory (now within average limits) and attention (now within the average range), low average language abilities (difficulty with verbal fluency tasks; 11 total fruits and vegetables listed within 1 minute). Extremely low visuospatial abilities (continue to recommend evaluation of any visual field deficits by neuro-ophthalmology) as well as extremely low delayed memory abilities (continue to recommend use of external memory aids).    Pt continues to  dmeonstrate improved cognitive communicaiton abiities that are c/b by improved recall, problem solving and ability to self-monitor and self-correct.   See the above treatment note for details.     OBJECTIVE IMPAIRMENTS include attention, memory, awareness, and executive functioning. These impairments are limiting patient from managing medications, managing appointments, managing finances, household responsibilities, and ADLs/IADLs. Factors affecting potential to achieve goals and functional outcome are ability to learn/carryover information, co-morbidities, medical prognosis, previous level of function, and severity of impairments. Patient will benefit from skilled SLP services to address above impairments and improve overall function.  REHAB POTENTIAL: Good  PLAN: SLP FREQUENCY: 1-2x/week  SLP DURATION: 12 weeks  PLANNED INTERVENTIONS: Language facilitation, Environmental controls, Cueing hierachy, Cognitive reorganization, Internal/external aids, Functional tasks, SLP instruction and feedback, Compensatory strategies, and Patient/family education   Happi B. Rubbie, M.S., CCC-SLP, Tree surgeon Certified Brain Injury Specialist [facility]  [facility] Office [phone] Ascom [phone] Fax [phone]

## 2024-05-23 ENCOUNTER — Ambulatory Visit: Admitting: Physical Therapy

## 2024-05-23 ENCOUNTER — Encounter

## 2024-05-23 ENCOUNTER — Ambulatory Visit: Attending: Physician Assistant | Admitting: Speech Pathology

## 2024-05-23 DIAGNOSIS — R41841 Cognitive communication deficit: Secondary | ICD-10-CM | POA: Diagnosis present

## 2024-05-23 NOTE — Therapy (Signed)
 OUTPATIENT SPEECH LANGUAGE PATHOLOGY  COGNITIVE COMMUNICATION TREATMENT NOTE   Patient Name: Isaac [name] MRN: [id number] DOB:16-Apr-1951, 73 y.o., male Today's Date: 05/23/2024  PCP: Isaac Null, MD REFERRING PROVIDER: Toribio Pitch, PA    End of Session - 05/23/24 1017     Visit Number 22    Number of Visits 25    Date for SLP Re-Evaluation 07/11/24    Authorization Type [employer] HMO/PPO    Progress Note Due on Visit 20    SLP Start Time [phone]    SLP Stop Time  1019    SLP Time Calculation (min) 29 min          Past Medical History:  Diagnosis Date   Arrhythmia    atrial fibrillation   CHF (congestive heart failure) (HCC)    Chronic kidney disease    Coronary artery disease    Hyperlipidemia    Hypertension    Myocardial infarct [facility])    Past Surgical History:  Procedure Laterality Date   CARDIAC DEFIBRILLATOR PLACEMENT  feb 2014   COLONOSCOPY N/A 03/29/2024   Procedure: COLONOSCOPY;  Surgeon: Isaac Victory LITTIE DOUGLAS, MD;  Location: [facility] ENDOSCOPY;  Service: Gastroenterology;  Laterality: N/A;   ENTEROSCOPY N/A 03/22/2024   Procedure: ENTEROSCOPY;  Surgeon: Isaac Elspeth SQUIBB, MD;  Location: [facility] ENDOSCOPY;  Service: Gastroenterology;  Laterality: N/A;   ESOPHAGOGASTRODUODENOSCOPY N/A 03/29/2024   Procedure: EGD (ESOPHAGOGASTRODUODENOSCOPY);  Surgeon: Isaac Victory LITTIE DOUGLAS, MD;  Location: [facility] ENDOSCOPY;  Service: Gastroenterology;  Laterality: N/A;   ICD GENERATOR CHANGEOUT N/A 02/09/2024   Procedure: ICD GENERATOR CHANGEOUT;  Surgeon: Isaac Danelle ORN, MD;  Location: [facility] INVASIVE CV LAB;  Service: Cardiovascular;  Laterality: N/A;   INSERTION OF IMPLANTABLE LEFT VENTRICULAR ASSIST DEVICE N/A 09/17/2021   Procedure: INSERTION OF IMPLANTABLE LEFT VENTRICULAR ASSIST DEVICE AND INSERTION OF FEMORAL ARTERIAL LINE;  Surgeon: Isaac Dorise POUR, MD;  Location: [facility] OR;  Service: Open Heart Surgery;  Laterality: N/A;   IR FLUORO GUIDE CV LINE RIGHT  09/27/2021   IR THORACENTESIS ASP  PLEURAL SPACE W/IMG GUIDE  09/27/2021   IR US  GUIDE VASC ACCESS RIGHT  09/27/2021   PLACEMENT OF IMPELLA LEFT VENTRICULAR ASSIST DEVICE N/A 09/12/2021   Procedure: PLACEMENT OF IMPELLA 5.5 LEFT VENTRICULAR ASSIST DEVICE;  Surgeon: Isaac Elspeth BROCKS, MD;  Location: [facility] OR;  Service: Open Heart Surgery;  Laterality: N/A;   RIGHT HEART CATH N/A 09/08/2021   Procedure: RIGHT HEART CATH;  Surgeon: Isaac Ezra RAMAN, MD;  Location: [facility] INVASIVE CV LAB;  Service: Cardiovascular;  Laterality: N/A;   TEE WITHOUT CARDIOVERSION N/A 09/12/2021   Procedure: TRANSESOPHAGEAL ECHOCARDIOGRAM (TEE);  Surgeon: Isaac Elspeth BROCKS, MD;  Location: [facility] OR;  Service: Open Heart Surgery;  Laterality: N/A;   TEE WITHOUT CARDIOVERSION N/A 09/17/2021   Procedure: TRANSESOPHAGEAL ECHOCARDIOGRAM (TEE);  Surgeon: Isaac Dorise POUR, MD;  Location: [facility] OR;  Service: Open Heart Surgery;  Laterality: N/A;   Patient Active Problem List   Diagnosis Date Noted   Acute gastric ulcer with hemorrhage 03/29/2024   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   Melena 03/22/2024   ABLA (acute blood loss anemia) 03/22/2024   Anticoagulated 03/22/2024   AVM (arteriovenous malformation) of small bowel, acquired with hemorrhage 03/22/2024   Upper GI bleed 03/21/2024   Hematoma 02/17/2024   Anemia 02/15/2024   Hypotension 02/15/2024   Sundowning 02/11/2024   Insomnia 02/11/2024   Constipation 02/11/2024   Stage 3b chronic kidney disease (HCC) 02/11/2024   Cardioembolic stroke (HCC) 02/10/2024   Acute  left PCA stroke (HCC) 02/05/2024   Abnormal thyroid  blood test 04/05/2023   Abscess of left groin 11/27/2022   VT (ventricular tachycardia) (HCC) 10/27/2022   ICD (implantable cardioverter-defibrillator) in place 10/27/2022   Pressure injury of skin 10/07/2021   LVAD (left ventricular assist device) present (HCC)    Protein-calorie malnutrition, severe 09/10/2021   Elevated TSH    PICC (peripherally inserted central catheter) in place    Cardiogenic shock  (HCC)    Syncope 09/07/2021   CHF (congestive heart failure) (HCC) 09/06/2021   CAD (coronary artery disease) 08/22/2021   Cardiomyopathy (HCC) 08/22/2021   Mixed hyperlipidemia 08/22/2021   A-fib (HCC) 08/22/2021   Elevated troponin 08/22/2021   Acute on chronic combined systolic and diastolic CHF (congestive heart failure) (HCC) 08/22/2021   Benign essential hypertension 02/22/2015   Chronic systolic heart failure (HCC) 07/05/2014    ONSET DATE: 02/05/2024; date of referral  02/22/2024  REFERRING DIAG: P36.467 (ICD-10-CM) - Cerebral infarction due to unspecified occlusion or stenosis of left posterior cerebral artery  THERAPY DIAG:  Cognitive communication deficit  Rationale for Evaluation and Treatment Rehabilitation  SUBJECTIVE:   PERTINENT HISTORY and DIAGNOSTIC FINDINGS:  [name] is a 73 y.o. with history of CAD, HTN, atrial fibrillation, CKD IV, VT and chronic systolic CHFs/p HM-3 LVAD on 09/17/21.  He presented to the [facility] ED on 02/05/24 for focal neuro deficits with R vision field cut and dysmetria. Code Stroke activated. CT brain positive for acute to subacute appearing Left PCA and Left cerebellar infarcts. Hyperdense Left PCA suggests occlusion of that vessel. No hemorrhagic transformation or mass effect. Seen by Neuro and decision made to treat with IV heparin . Echocardiogram shows EF 25 to 30% with LVAD at the apex.  Left atrial is moderately dilate. CT angio distal left P2/P3 occlusion.Repeat CT head 02/06/24 with evolving left cerebellar, left PCA and right frontal gyrus MCA branch infarct. No hemorrhage.  Neurology feels infarcts 2/2 LVAD and signed off  Pt attended [facility] CIR from until 02/18/2024, received ST services targeting cognitive linguistic abilities and discharged at MOD A for cognitive linguistic skills. Memory/orientation deficits remain the pt's biggest barrier to progress and overall safety.   He was admitted on 03/21/2024 from the  office for GI bleed with hemoglobin down to 6.4. Coumadin  was held. He was placed on PPI. GI was consulted and he was transfused multiple PRBCs (total of 7 units this admission). He underwent EGD on 7/2 with friable tissue in the duodenum and 4 AVMS which were treated with APC. INR goal was adjusted to 2-2.5. Unfortunately, he continued to have melena and his hemoglobin continued to drop. He underwent repeat EGD + colonoscopy on 7/9  which showed 1 non-bleeding superficial gastric ulcer and 1 clip was placed. Colonoscopy unremarkable. No further bleeding or drop in hemoglobin. INR 1.8 on day of discharge.   PAIN:  Are you having pain? No   FALLS: Has patient fallen in last 6 months?  No  LIVING ENVIRONMENT: Lives with: lives with their spouse Lives in: House/apartment  PLOF:  Level of assistance: Independent with ADLs, Independent with IADLs Employment: Retired   PATIENT GOALS   to improve functional independence  SUBJECTIVE STATEMENT: Pt arrived to session 15 minutes late, pt was not aware that he was last, his wife was parking the car and came into session when finished.  Pt accompanied by: self, wife  OBJECTIVE:  TODAY'S TREATMENT:   Skilled treatment session focused on pt's cognitive communication goals.  SLP facilitated session by providing the following interventions:  Korebalance was introduced to increase the cognitive load as the pt balanced while working through predictable and unpredictable tasks (counting by 5's, alphabet). Tasks targeted pt's working memory, scanning, and alternating attention.    Alphabet within 90 seconds: 1st attempt: 20 identified; 2nd attempt 18 identified with verbal cue to identify strategy to increase accuracy - pt identified need to scan each row from left to right; 3rd attempt 22 identified Counting by 5's: 1st attempt (90s) 8 identified; 2nd attempt (90s) 11 identified; 3rd attempt 24 identified (all) - time increased to 300 seconds to allow pt  more opportunities to scan - Pt required max A via verbal and gestural cues (pointing) from SLP to scan each row to the end to increase accuracy - pt consistently scanned over items, requiring mod A verbal cues to draw attention to the correct number - pt able to identify all numbers.   SLP recommended pt visit neuro ophthalmologist regarding visual field deficits. Education provided on referral process.  PATIENT EDUCATION: Education details: see above Person educated: Patient  Education method: Explanation Education comprehension: verbalized understanding   HOME EXERCISE PROGRAM:   Set alarms on phone [facility], see above   GOALS:  Goals reviewed with patient? Yes  SHORT TERM GOALS: Target date: 10 sessions  Updated 05/16/24 With Mod A, pt will utilize external memory aids to answer questions regarding orientation information with 80% accuracy.  Baseline: Goal status: INITIAL: MET  2.  With Mod A, pt will improve word finding by using strategies to produce generative naming list of equipment and vocabulary related to LVAD.  Baseline:  Goal status: INITIAL: MET  3.  Pt will complete addition cognitive assessment to establish current level of ability.  Baseline: Pt discharged from CIR requiring Mod A for basic cognitive function.  Goal status: INITIAL: MET  4. With Supervision A, patient will demonstrate knowledge of appropriate activities to support cognitive and  language function outside of ST with assistance from family.    Baseline:  Goal status: INITIAL   LONG TERM GOALS: Target date: 07/11/2024  Updated: 05/16/2024  With supervision A, pt will attend to items on his right to improve safety awareness and accuracy of task completion in 5 out of 7 opportunities.  Baseline:  Goal status: INITIAL: Progress made, he continues to require supervision for cues - continue to recommend follow up with neuro-ophthalmologist   2.  With Min A, pt will external and internal  memory aides to help short term and prospective memory in 5 out of 7 opportunities.  Baseline:  Goal status: INITIAL: Progress made, pt requires cues to use strategies, he is able to state that he needs to use compensatory strategies but doesn't engage in using them  3.  With supervision A, pt will be able to verbally sequence (using appropriate vocabulary) LVAD features, alerts and switching battery sources.  Baseline:  Goal status: INITIAL: MET   ASSESSMENT:  CLINICAL IMPRESSION: Patient is a 73 y.o. male LVAD patient, who was seen today for a cognitive communication treatment d/t left cerebellar, left PCA and right frontal gyrus MCA branch infarct. Pt presents with severe cognitive impairment that is c/b by severe deficits in attention, awareness (pt is aware of difficulty/poor performance within tasks but unaware of overall impairment and impact on safety), profound memory deficits in immediate recall, short term memory, retrieval, visual, auditory and prospective memory decreased awareness of confabulatory responses and severe deficits in executive function as  observed during visuospatial tasks, specifically task planning, organization, sequencing with pt losing points d/t slow task completion. Overall adequate expressive and receptive communication.   Pt presents with improved in immediate memory (now within average limits) and attention (now within the average range), low average language abilities (difficulty with verbal fluency tasks; 11 total fruits and vegetables listed within 1 minute). Extremely low visuospatial abilities (continue to recommend evaluation of any visual field deficits by neuro-ophthalmology) as well as extremely low delayed memory abilities (continue to recommend use of external memory aids).    Pt continues to dmeonstrate improved cognitive communicaiton abiities that are c/b by improved recall, problem solving and ability to self-monitor and self-correct.   See the  above treatment note for details.     OBJECTIVE IMPAIRMENTS include attention, memory, awareness, and executive functioning. These impairments are limiting patient from managing medications, managing appointments, managing finances, household responsibilities, and ADLs/IADLs. Factors affecting potential to achieve goals and functional outcome are ability to learn/carryover information, co-morbidities, medical prognosis, previous level of function, and severity of impairments. Patient will benefit from skilled SLP services to address above impairments and improve overall function.  REHAB POTENTIAL: Good  PLAN: SLP FREQUENCY: 1-2x/week  SLP DURATION: 12 weeks  PLANNED INTERVENTIONS: Language facilitation, Environmental controls, Cueing hierachy, Cognitive reorganization, Internal/external aids, Functional tasks, SLP instruction and feedback, Compensatory strategies, and Patient/family education   Happi B. Rubbie, M.S., CCC-SLP, Tree surgeon Certified Brain Injury Specialist [facility]  [facility] Office [phone] Ascom [phone] Fax [phone]

## 2024-05-25 ENCOUNTER — Ambulatory Visit: Admitting: Speech Pathology

## 2024-05-25 ENCOUNTER — Encounter: Admitting: Occupational Therapy

## 2024-05-25 ENCOUNTER — Ambulatory Visit

## 2024-05-25 DIAGNOSIS — R41841 Cognitive communication deficit: Secondary | ICD-10-CM | POA: Diagnosis not present

## 2024-05-25 NOTE — Therapy (Unsigned)
 OUTPATIENT SPEECH LANGUAGE PATHOLOGY  COGNITIVE COMMUNICATION TREATMENT NOTE DISCHARGE SUMMARY   Patient Name: Isaac [name] MRN: [id number] DOB:[birth year], 73 y.o., male Today's Date: 05/25/2024  PCP: Lynwood Null, MD REFERRING PROVIDER: Toribio Pitch, PA    End of Session - 05/25/24 0913     Visit Number 23    Number of Visits 25    Date for SLP Re-Evaluation 07/11/24    Authorization Type [employer] HMO/PPO    Progress Note Due on Visit 20    SLP Start Time 0845    SLP Stop Time  0930    SLP Time Calculation (min) 45 min    Activity Tolerance Patient tolerated treatment well          Past Medical History:  Diagnosis Date   Arrhythmia    atrial fibrillation   CHF (congestive heart failure) (HCC)    Chronic kidney disease    Coronary artery disease    Hyperlipidemia    Hypertension    Myocardial infarct [facility])    Past Surgical History:  Procedure Laterality Date   CARDIAC DEFIBRILLATOR PLACEMENT  feb 2014   COLONOSCOPY N/A 03/29/2024   Procedure: COLONOSCOPY;  Surgeon: Legrand Victory LITTIE DOUGLAS, MD;  Location: [facility] ENDOSCOPY;  Service: Gastroenterology;  Laterality: N/A;   ENTEROSCOPY N/A 03/22/2024   Procedure: ENTEROSCOPY;  Surgeon: Leigh Elspeth SQUIBB, MD;  Location: [facility] ENDOSCOPY;  Service: Gastroenterology;  Laterality: N/A;   ESOPHAGOGASTRODUODENOSCOPY N/A 03/29/2024   Procedure: EGD (ESOPHAGOGASTRODUODENOSCOPY);  Surgeon: Legrand Victory LITTIE DOUGLAS, MD;  Location: [facility] ENDOSCOPY;  Service: Gastroenterology;  Laterality: N/A;   ICD GENERATOR CHANGEOUT N/A 02/09/2024   Procedure: ICD GENERATOR CHANGEOUT;  Surgeon: Waddell Danelle ORN, MD;  Location: [facility] INVASIVE CV LAB;  Service: Cardiovascular;  Laterality: N/A;   INSERTION OF IMPLANTABLE LEFT VENTRICULAR ASSIST DEVICE N/A 09/17/2021   Procedure: INSERTION OF IMPLANTABLE LEFT VENTRICULAR ASSIST DEVICE AND INSERTION OF FEMORAL ARTERIAL LINE;  Surgeon: Lucas Dorise POUR, MD;  Location: [facility] OR;  Service: Open Heart Surgery;   Laterality: N/A;   IR FLUORO GUIDE CV LINE RIGHT  09/27/2021   IR THORACENTESIS ASP PLEURAL SPACE W/IMG GUIDE  09/27/2021   IR US  GUIDE VASC ACCESS RIGHT  09/27/2021   PLACEMENT OF IMPELLA LEFT VENTRICULAR ASSIST DEVICE N/A 09/12/2021   Procedure: PLACEMENT OF IMPELLA 5.5 LEFT VENTRICULAR ASSIST DEVICE;  Surgeon: Kerrin Elspeth BROCKS, MD;  Location: [facility] OR;  Service: Open Heart Surgery;  Laterality: N/A;   RIGHT HEART CATH N/A 09/08/2021   Procedure: RIGHT HEART CATH;  Surgeon: Rolan Ezra RAMAN, MD;  Location: [facility] INVASIVE CV LAB;  Service: Cardiovascular;  Laterality: N/A;   TEE WITHOUT CARDIOVERSION N/A 09/12/2021   Procedure: TRANSESOPHAGEAL ECHOCARDIOGRAM (TEE);  Surgeon: Kerrin Elspeth BROCKS, MD;  Location: [facility] OR;  Service: Open Heart Surgery;  Laterality: N/A;   TEE WITHOUT CARDIOVERSION N/A 09/17/2021   Procedure: TRANSESOPHAGEAL ECHOCARDIOGRAM (TEE);  Surgeon: Lucas Dorise POUR, MD;  Location: [facility] OR;  Service: Open Heart Surgery;  Laterality: N/A;   Patient Active Problem List   Diagnosis Date Noted   Acute gastric ulcer with hemorrhage 03/29/2024   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   Melena 03/22/2024   ABLA (acute blood loss anemia) 03/22/2024   Anticoagulated 03/22/2024   AVM (arteriovenous malformation) of small bowel, acquired with hemorrhage 03/22/2024   Upper GI bleed 03/21/2024   Hematoma 02/17/2024   Anemia 02/15/2024   Hypotension 02/15/2024   Sundowning 02/11/2024   Insomnia 02/11/2024   Constipation 02/11/2024   Stage 3b chronic kidney disease (  HCC) 02/11/2024   Cardioembolic stroke (HCC) 02/10/2024   Acute left PCA stroke (HCC) 02/05/2024   Abnormal thyroid  blood test 04/05/2023   Abscess of left groin 11/27/2022   VT (ventricular tachycardia) (HCC) 10/27/2022   ICD (implantable cardioverter-defibrillator) in place 10/27/2022   Pressure injury of skin 10/07/2021   LVAD (left ventricular assist device) present (HCC)    Protein-calorie malnutrition, severe 09/10/2021   Elevated  TSH    PICC (peripherally inserted central catheter) in place    Cardiogenic shock (HCC)    Syncope 09/07/2021   CHF (congestive heart failure) (HCC) 09/06/2021   CAD (coronary artery disease) 08/22/2021   Cardiomyopathy (HCC) 08/22/2021   Mixed hyperlipidemia 08/22/2021   A-fib (HCC) 08/22/2021   Elevated troponin 08/22/2021   Acute on chronic combined systolic and diastolic CHF (congestive heart failure) (HCC) 08/22/2021   Benign essential hypertension 02/22/2015   Chronic systolic heart failure (HCC) 07/05/2014    ONSET DATE: 02/05/2024; date of referral  02/22/2024  REFERRING DIAG: P36.467 (ICD-10-CM) - Cerebral infarction due to unspecified occlusion or stenosis of left posterior cerebral artery  THERAPY DIAG:  Cognitive communication deficit  Rationale for Evaluation and Treatment Rehabilitation  SUBJECTIVE:   PERTINENT HISTORY and DIAGNOSTIC FINDINGS:  Mr. Leser is a 73 y.o. with history of CAD, HTN, atrial fibrillation, CKD IV, VT and chronic systolic CHFs/p HM-3 LVAD on 09/17/21.  He presented to the [facility] ED on 02/05/24 for focal neuro deficits with R vision field cut and dysmetria. Code Stroke activated. CT brain positive for acute to subacute appearing Left PCA and Left cerebellar infarcts. Hyperdense Left PCA suggests occlusion of that vessel. No hemorrhagic transformation or mass effect. Seen by Neuro and decision made to treat with IV heparin . Echocardiogram shows EF 25 to 30% with LVAD at the apex.  Left atrial is moderately dilate. CT angio distal left P2/P3 occlusion.Repeat CT head 02/06/24 with evolving left cerebellar, left PCA and right frontal gyrus MCA branch infarct. No hemorrhage.  Neurology feels infarcts 2/2 LVAD and signed off  Pt attended [facility] CIR from until 02/18/2024, received ST services targeting cognitive linguistic abilities and discharged at MOD [facility] for cognitive linguistic skills. Memory/orientation deficits remain the pt's biggest  barrier to progress and overall safety.   He was admitted on 03/21/2024 from the office for GI bleed with hemoglobin down to 6.4. Coumadin  was held. He was placed on PPI. GI was consulted and he was transfused multiple PRBCs (total of 7 units this admission). He underwent EGD on 7/2 with friable tissue in the duodenum and 4 AVMS which were treated with APC. INR goal was adjusted to 2-2.5. Unfortunately, he continued to have melena and his hemoglobin continued to drop. He underwent repeat EGD + colonoscopy on 7/9  which showed 1 non-bleeding superficial gastric ulcer and 1 clip was placed. Colonoscopy unremarkable. No further bleeding or drop in hemoglobin. INR 1.8 on day of discharge.   PAIN:  Are you having pain? No   FALLS: Has patient fallen in last 6 months?  No  LIVING ENVIRONMENT: Lives with: lives with their spouse Lives in: House/apartment  PLOF:  Level of assistance: Independent with ADLs, Independent with IADLs Employment: Retired   PATIENT GOALS   to improve functional independence  SUBJECTIVE STATEMENT: Pt accompanied by his wife, stated today would be the last day of ST and is beginning to work with a Systems analyst.  Pt accompanied by: self, wife  OBJECTIVE:  TODAY'S TREATMENT:   Skilled treatment  session focused on pt's cognitive communication goals. SLP facilitated session by providing the following interventions:  Korebalance was used to increase the cognitive load as the pt balanced while working through predictable and unpredictable tasks Programmer, systems, matching memory game). Tasks targeted pt's working memory, scanning, and alternating attention.    Alphabet within 90 seconds: 1st attempt: 19 identified - Pt required min A to scan each row carefully; 2nd attempt 13 identified with max A via verbal and gestural cues from SLP to scan each row and recall which letter pt was on  Memory matching game 300 seconds: 1st attempt score: 12 in 184 sec; 2nd attempt score: 12 in  35 sec - Pt required max A via verbal and gestural cues to apply strategies to attend to each card row by row  SLP recommended pt visit driving school d/t desire to drive again.   PATIENT EDUCATION: Education details: see above Person educated: Patient  Education method: Explanation Education comprehension: verbalized understanding   HOME EXERCISE PROGRAM:   Set alarms on phone [employer] Therapy, see above   GOALS:  Goals reviewed with patient? Yes  SHORT TERM GOALS: Target date: 10 sessions  Updated 05/25/24  Updated 05/16/24 With Mod A, pt will utilize external memory aids to answer questions regarding orientation information with 80% accuracy.  Baseline: Goal status: INITIAL: MET  2.  With Mod A, pt will improve word finding by using strategies to produce generative naming list of equipment and vocabulary related to LVAD.  Baseline:  Goal status: INITIAL: MET  3.  Pt will complete addition cognitive assessment to establish current level of ability.  Baseline: Pt discharged from CIR requiring Mod A for basic cognitive function.  Goal status: INITIAL: MET  4. With Supervision A, patient will demonstrate knowledge of appropriate activities to support cognitive and  language function outside of ST with assistance from family.    Baseline:  Goal status: INITIAL: MET Pt is aware of recommended activities. Continues to have reduced participation in activities because he reports I can't see.   LONG TERM GOALS: Target date: 07/11/2024  Updated: 05/25/2024 Updated: 05/16/2024  With supervision A, pt will attend to items on his right to improve safety awareness and accuracy of task completion in 5 out of 7 opportunities.  Baseline:  Goal status: INITIAL: Progress made, he continues to require supervision for cues - continue to recommend follow up with neuro-ophthalmologist  Pt continues to experience difficulty with independently attending to items on R side, requiring  supervision for cues.   2.  With Min A, pt will external and internal memory aides to help short term and prospective memory in 5 out of 7 opportunities.  Baseline:  Goal status: INITIAL: Progress made, pt requires cues to use strategies, he is able to state that he needs to use compensatory strategies but doesn't engage in using them   3.  With supervision A, pt will be able to verbally sequence (using appropriate vocabulary) LVAD features, alerts and switching battery sources.  Baseline:  Goal status: INITIAL: MET   ASSESSMENT:  CLINICAL IMPRESSION: Patient is a 73 y.o. male LVAD patient, who was seen today for a cognitive communication treatment d/t left cerebellar, left PCA and right frontal gyrus MCA branch infarct. Pt presents with severe cognitive impairment that is c/b by severe deficits in attention, awareness (pt is aware of difficulty/poor performance within tasks but unaware of overall impairment and impact on safety), profound memory deficits in immediate recall, short term memory, retrieval, visual, auditory  and prospective memory decreased awareness of confabulatory responses and severe deficits in executive function as observed during visuospatial tasks, specifically task planning, organization, sequencing with pt losing points d/t slow task completion. Overall adequate expressive and receptive communication.   Pt presents with improved in immediate memory (now within average limits) and attention (now within the average range), low average language abilities (difficulty with verbal fluency tasks; 11 total fruits and vegetables listed within 1 minute). Extremely low visuospatial abilities (continue to recommend evaluation of any visual field deficits by neuro-ophthalmology) as well as extremely low delayed memory abilities (continue to recommend use of external memory aids).    Pt continues to dmeonstrate improved cognitive communicaiton abiities that are c/b by improved recall,  problem solving and ability to self-monitor and self-correct.   See the above treatment note for details.     OBJECTIVE IMPAIRMENTS include attention, memory, awareness, and executive functioning. These impairments are limiting patient from managing medications, managing appointments, managing finances, household responsibilities, and ADLs/IADLs. Factors affecting potential to achieve goals and functional outcome are ability to learn/carryover information, co-morbidities, medical prognosis, previous level of function, and severity of impairments. Patient will benefit from skilled SLP services to address above impairments and improve overall function.  REHAB POTENTIAL: Good  PLAN: SLP FREQUENCY: 1-2x/week  SLP DURATION: 12 weeks  PLANNED INTERVENTIONS: Language facilitation, Environmental controls, Cueing hierachy, Cognitive reorganization, Internal/external aids, Functional tasks, SLP instruction and feedback, Compensatory strategies, and Patient/family education   Happi B. Rubbie, M.S., CCC-SLP, Tree surgeon Certified Brain Injury Specialist [facility]  [facility] Office [phone] Ascom [phone] Fax [phone]

## 2024-05-26 ENCOUNTER — Ambulatory Visit (HOSPITAL_COMMUNITY): Payer: Self-pay | Admitting: Pharmacist

## 2024-05-26 LAB — POCT INR: INR: 2.5 (ref 2.0–3.0)

## 2024-05-29 ENCOUNTER — Ambulatory Visit: Admitting: Speech Pathology

## 2024-05-29 ENCOUNTER — Encounter (HOSPITAL_COMMUNITY)

## 2024-05-29 ENCOUNTER — Ambulatory Visit: Admitting: Physical Therapy

## 2024-05-29 ENCOUNTER — Other Ambulatory Visit (HOSPITAL_COMMUNITY): Payer: Self-pay | Admitting: Unknown Physician Specialty

## 2024-05-29 DIAGNOSIS — Z95811 Presence of heart assist device: Secondary | ICD-10-CM

## 2024-05-29 DIAGNOSIS — Z7901 Long term (current) use of anticoagulants: Secondary | ICD-10-CM

## 2024-05-29 DIAGNOSIS — I5022 Chronic systolic (congestive) heart failure: Secondary | ICD-10-CM

## 2024-05-30 ENCOUNTER — Telehealth (HOSPITAL_COMMUNITY): Payer: Self-pay | Admitting: Cardiology

## 2024-05-30 ENCOUNTER — Ambulatory Visit (HOSPITAL_COMMUNITY): Payer: Self-pay | Admitting: Cardiology

## 2024-05-30 ENCOUNTER — Other Ambulatory Visit (HOSPITAL_COMMUNITY): Payer: Self-pay | Admitting: *Deleted

## 2024-05-30 ENCOUNTER — Encounter: Admitting: Occupational Therapy

## 2024-05-30 ENCOUNTER — Encounter (HOSPITAL_COMMUNITY): Payer: Self-pay | Admitting: Cardiology

## 2024-05-30 ENCOUNTER — Ambulatory Visit: Admitting: Speech Pathology

## 2024-05-30 ENCOUNTER — Ambulatory Visit

## 2024-05-30 ENCOUNTER — Ambulatory Visit (HOSPITAL_COMMUNITY)
Admission: RE | Admit: 2024-05-30 | Discharge: 2024-05-30 | Disposition: A | Discharge status: Home / Self Care | Source: Ambulatory Visit | Attending: Cardiology | Admitting: Cardiology

## 2024-05-30 ENCOUNTER — Telehealth (HOSPITAL_COMMUNITY): Payer: Self-pay | Admitting: Pharmacy Technician

## 2024-05-30 ENCOUNTER — Ambulatory Visit (HOSPITAL_COMMUNITY): Payer: Self-pay | Admitting: Pharmacist

## 2024-05-30 DIAGNOSIS — I5022 Chronic systolic (congestive) heart failure: Secondary | ICD-10-CM

## 2024-05-30 DIAGNOSIS — D509 Iron deficiency anemia, unspecified: Secondary | ICD-10-CM | POA: Insufficient documentation

## 2024-05-30 DIAGNOSIS — Z4509 Encounter for adjustment and management of other cardiac device: Secondary | ICD-10-CM | POA: Insufficient documentation

## 2024-05-30 DIAGNOSIS — Z7901 Long term (current) use of anticoagulants: Secondary | ICD-10-CM

## 2024-05-30 DIAGNOSIS — Z95811 Presence of heart assist device: Secondary | ICD-10-CM

## 2024-05-30 LAB — CBC
HCT: 30.5 % — ABNORMAL LOW (ref 39.0–52.0)
Hemoglobin: 9.2 g/dL — ABNORMAL LOW (ref 13.0–17.0)
MCH: 25.3 pg — ABNORMAL LOW (ref 26.0–34.0)
MCHC: 30.2 g/dL (ref 30.0–36.0)
MCV: 84 fL (ref 80.0–100.0)
Platelets: 211 K/uL (ref 150–400)
RBC: 3.63 MIL/uL — ABNORMAL LOW (ref 4.22–5.81)
RDW: 16.6 % — ABNORMAL HIGH (ref 11.5–15.5)
WBC: 4.1 K/uL (ref 4.0–10.5)
nRBC: 0 % (ref 0.0–0.2)

## 2024-05-30 LAB — BASIC METABOLIC PANEL WITH GFR
Anion gap: 11 (ref 5–15)
BUN: 34 mg/dL — ABNORMAL HIGH (ref 8–23)
CO2: 23 mmol/L (ref 22–32)
Calcium: 9 mg/dL (ref 8.9–10.3)
Chloride: 109 mmol/L (ref 98–111)
Creatinine, Ser: 2.33 mg/dL — ABNORMAL HIGH (ref 0.61–1.24)
GFR, Estimated: 29 mL/min — ABNORMAL LOW (ref >60)
Glucose, Bld: 90 mg/dL (ref 70–99)
Potassium: 3.5 mmol/L (ref 3.5–5.1)
Sodium: 143 mmol/L (ref 135–145)

## 2024-05-30 LAB — IRON AND TIBC
Iron: 22 ug/dL — ABNORMAL LOW (ref 45–182)
Saturation Ratios: 6 % — ABNORMAL LOW (ref 17.9–39.5)
TIBC: 367 ug/dL (ref 250–450)
UIBC: 345 ug/dL

## 2024-05-30 LAB — FERRITIN: Ferritin: 20 ng/mL — ABNORMAL LOW (ref 24–336)

## 2024-05-30 LAB — PROTIME-INR
INR: 2.8 — ABNORMAL HIGH (ref 0.8–1.2)
Prothrombin Time: 30.6 s — ABNORMAL HIGH (ref 11.4–15.2)

## 2024-05-30 LAB — FOLATE: Folate: 19.9 ng/mL (ref >5.9)

## 2024-05-30 LAB — LACTATE DEHYDROGENASE: LDH: 172 U/L (ref 98–192)

## 2024-05-30 LAB — VITAMIN B12: Vitamin B-12: 652 pg/mL (ref 180–914)

## 2024-05-30 MED ORDER — FUROSEMIDE 20 MG PO TABS: 60.0000 mg | ORAL_TABLET | Freq: Every day | ORAL | 11 refills | Status: DC

## 2024-05-30 NOTE — Progress Notes (Addendum)
 Patient presents for hospital discharge follow up with his wife [name]. Denies issues/concerns with VAD equipment or drive line.    Patient ambulated in to clinic independently without difficulty. Denies lightheadedness, dizziness, falls, shortness of breath, and signs of bleeding.   States he is not taking Potassium or Magnesium  as prescribed. Taking all other medications as prescribed. Weight up 10 lbs today. Per Dr Rolan will increase Lasix  to 60 mg daily. Updated prescription sent to pt's pharmacy. Pt and wife verbalized understanding of medication change.   Pt states his right eye vision has improved some since recent stroke. Recently seen by local eye MD who referred pt to neurology for referral to a neuro-ophthalmologist for further assessment. Pt has neuro appt scheduled 9/26. Small growth noted on left eye that came up yesterday. Advised pt to call eye MD for an appt to assess growth. They verbalized understanding.   Dr. Rolan spoke at length with patient about the benefits of monthly Octreotide  in helping prevent future GI bleeds. Pt asks about side effects and Dr. Rolan explained he received sq  Octreotide  in hospital without any issues, and monthly Octreotide  would present even less risk of side effects.Pt and wife would like to hold off on starting Octreotide  injections at this time. Per Dr Rolan if pt has another bleeding episode pt will need to start injections. They both verbalized understanding.   Provided with cardiac clearance letter for pt to exercise with a personal trainer at the local [employer] per Dr Rolan.   Referral sent for IV iron  infusion sent today per Dr Makalynn Berwanger/anemia panel results.   Vital Signs:  Doppler Pressure: 98 Automatic BP: 119/83 (95) HR: 75 SPO2: 99%   Weight: 191.0 lb w/ eqt Last weight: 181.6 lb w/eqt   VAD Indication: Destination Therapy due to age and kidney disease   LVAD assessment:  HM III: Speed: 5300 rpms Flow: 4.5 Power: 3.8 w    PI:  3.1 Alarms: none Events:  5 - 10 PI events daily  Fixed speed: 5300 Low speed limit: 5000  Primary controller: Expired. Replaced with [id number]. Manufacture: 03/20/24 Expiration 02/17/26. Back up battery due for replacement in 33 months Secondary controller: Back up battery due for replacement in 33 months    I reviewed the LVAD parameters from today and compared the results to the patient's prior recorded data. LVAD interrogation was NEGATIVE for significant power changes, NEGATIVE for clinical alarms and STABLE for PI events/speed drops. No programming changes were made and pump is functioning within specified parameters. Pt is performing daily controller and system monitor self tests along with completing weekly and monthly maintenance for LVAD equipment.   LVAD equipment check completed and is in good working order. Back-up equipment present.   Annual Maintenance completed: 10/07/2022    Exit Site Care: Wife has been performing weekly dressing changes using weekly dressings. Drive line anchor correctly applied.  Pt given 8 weekly kits and a box of 2 x 2 for home use.  Device:  [employer] dual  Therapies: on Pacing: VVI 50 Last check: 05/12/24   BP & Labs:  Doppler 90 - Doppler is reflecting modified systolic   Hgb 9.2- No S/S of bleeding. Specifically denies melena/BRBPR or nosebleeds.   LDH 172 with established baseline of 200 - 300. Denies tea-colored urine. No power elevations noted on interrogation.   Patient Instructions:  Increase Lasix  to 60 mg daily Coumadin  dosing per Lauren PharmD Return to clinic in 2 months to see Dr Rolan Please  go to [facility] lab 9/17 for repeat BMET  Addendum: K 3.5 today. Per Dr Rolan will restart K 20 meq daily + MagOxide 400 mg daily. Repeat BMET next week at [facility]. Per anemia panel results referral sent to infusion pharmacy per Dr Rolan. Called and spoke with pt and wife- made aware of all the above. They verbalized understanding of  medication instructions, need for repeat labs, and need for IV iron .   Isaiah Knoll RN VAD Coordinator  Office: [phone]  24/7 Pager: [phone]      Cardiology: Dr. Rolan PCP: Valora Agent, MD  Chief complaint: CHF  Follow up for Heart Failure/LVAD: 73 y.o. with history of CAD, HTN, atrial fibrillation, and chronic systolic CHF returns for followup of CHF/LVAD.   Patient has had a long-standing cardiomyopathy, EF had been in the 25% range for years.  He had PCI to OM1 in 2007 and RCA in 2013.  From his report, these episodes do not sound like ACS events.  Echo in 12/22 showed EF < 20%, severe LV dilation, restrictive diastolic function, moderate RV dysfunction, moderate MR, mod-severe TR. HF was been complicated by CKD stage 3. He was also noted on device interrogation to have been in atrial fibrillation persistently since 10/22.  He was admitted at [facility] in 12/22.  BP was low, his BP-active meds were stopped and midodrine  was begun.  He was diuresed and discharged home.    He reports ongoing severe dyspnea and was admitted again in 12/22 after he stood up then passed out at home.  Device interrogation showed run of VT treated x 2 with ATP possibly around the time of syncope.  RHC in 12/22 showed low output HF and he developed cardiogenic shock. Impella 5.5 was placed.  On 09/17/21, HM3 LVAD was placed, ICD leads were plastered to the tricuspid valve with severe TR, the valve was not replaced.  The patient had significant interoperative hypotension.  Post-operatively, he developed progressive renal failure and was started on CVVH then iHD.  He had a bumpy post-op course with renal failure and RV failure, but was ultimately able to discharge home on 10/24/21.   Ramp echo in 3/23, RV mildly dilated with normal systolic function, severe TR, LV EF 20-25%, aortic valve opens every beat.  Speed increased to 5300 rpm. The IV septum was mildly leftwards at 5300 rpm but flow increased and AoV  still opened every beat.   Patient was admitted in 3/24 with left groin abscess.  This spontaneously drained and was treated with IV antibiotics.  He was sent home on linezolid  and Augmentin .  He developed profuse diarrhea that he attributed to linezolid .  Linezolid  was stopped and he was continued on Augmentin .   Echo in 7/24 showed EF < 20%, IV septum midline to slightly leftward, RV moderately dilated with moderately decreased systolic function, IVC dilated with severe TR.  Speed not changed.   He was admitted with dysarthria and right-sided weakness in 5/25.  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 Echo this admission showed EF 20-25%, mild LVH, LVAD present, mild RV dysfunction, moderate AI, mild RV dysfunction with normal RV size, severe TR.  INR goal increased to 2.5-3. Dual chamber ICD generator was also changed this admission, needs pacing at times with bradycardia.   Patient was admitted 03/21/24 with GI bleeding/melena, he received a total of 7 units PRBCs.  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  INR goal was decreased to 2-2.5.   Patient returns for LVAD followup with his wife.  Weight is trending up.  No BRBPR/melena.  MAP 95.  Vision clearing up from prior CVA. He has finished speech pathology and PT.  No dyspnea walking on flat ground.  No chest pain.  No orthopnea/PND.  Has not been very active since his CVA.   LVAD interrogation (personally reviewed): See LVAD nurse's note above. No alarms, rare PI events.    Labs (3/24): hgb 10.7, LDH 157, LFTs normal, creatinine 2.75 Labs (5/24): K 3.6, creatinine 2.7, LFTs normal, TSH normal Labs (7/24): hgb 11.7, LDH 172, K 3.5, creatinine 2.33 Labs (10/24): K 3.4, creatinine 2.24 Labs (12/24): K 4, creatinine 2.37 Labs (2/25): TSH normal Labs (3/25): hgb 12.5, K 4.9, creatinine 2.57 Labs (4/25): creatinine 2.8 Labs (5/25): K 4, creatinine  1.97, LDH 163, hgb 12.3 Labs (6/25): K 4.3, creatinine 2.24, LDH 175, hgb 11.9, plts 220 Labs (7/25): K 4.2, creatinine 1.9 => 2.33, hgb 8.8 => 9.6, Transferrin saturation 7%.   PMH: 1. HTN 2. Hyperlipidemia 3. CAD: H/o PCI to OM1 2007 and PCI to RCA in 2013.  Uncertain of circumstances but do not sound like ACS events.  4. CKD stage IV: Patient was on HD for several months post-LVAD but was able to come off.  5. Atrial fibrillation: Paroxysmal 6. Chronic systolic CHF: Long-standing cardiomyopathy with EF in the 25% range.  Cause uncertain, has history of PCI to RCA and OM1, but degree of CAD does not seem to explain severity of cardiomyopathy. Father had history of CAD/CHF.  Not heavy drinker. Has [employer] dual chamber ICD.  - Echo (12/22): EF < 20%, severe LV dilation, restrictive diastolic function, moderate RV dysfunction, moderate MR, mod-severe TR.  - 12/22 cardiogenic shock with Impella 5.5 placement then Heartmate 3 LVAD.  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  - Echo (7/24): EF < 20%, IV septum midline to slightly leftward, RV moderately dilated with moderately decreased systolic function, IVC dilated with severe TR. - Echo (5/25): EF 20-25%, mild LVH, LVAD present, mild RV dysfunction, moderate AI, mild RV dysfunction with normal RV size, severe TR.  7. VT - Nausea with amiodarone .  8. RV failure/severe TR 9. Citrobacter UTI (8/23) 10. Left groin abscess 3/24 11. 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  12. GI bleeding: 7/25, multiple AVMs treated with APC.    Current Outpatient Medications  Medication Sig Dispense Refill   Multiple Vitamins-Minerals (ONE A DAY MEN 50 PLUS PO) Take 1 tablet by mouth daily.     pantoprazole  (PROTONIX ) 40 MG tablet Take twice a day for 1 month and then can decrease back to once daily. 60 tablet 1   rosuvastatin  (CRESTOR ) 10 MG tablet Take 1 tablet (10 mg total) by mouth at  bedtime. 30 tablet 0   sildenafil  (REVATIO ) 20 MG tablet Take 1 tablet (20 mg total) by mouth 3 (three) times daily. 90 tablet 0   warfarin (COUMADIN ) 2 MG tablet Take 2 mg by mouth every Tuesday, Thursday, and Saturday and 4 mg all other days 40 tablet 11   acetaminophen  (TYLENOL ) 325 MG tablet Take 2 tablets (650 mg total) by mouth every 4 (four) hours as needed for headache or mild pain (pain score 1-3). (Patient not taking: Reported on 05/30/2024)     furosemide  (LASIX ) 20 MG tablet Take 3 tablets (60 mg  total) by mouth daily. 90 tablet 11   magnesium  oxide (MAG-OX) 400 (240 Mg) MG tablet Take 1 tablet (400 mg total) by mouth daily. (Patient not taking: Reported on 05/30/2024) 30 tablet 0   potassium chloride  SA (KLOR-CON  M) 10 MEQ tablet Take 2 tablets (20 mEq total) by mouth daily. (Patient not taking: Reported on 05/30/2024) 180 tablet 3   No current facility-administered medications for this encounter.    Neosporin [neomycin-bacitracin zn-polymyx], Doxycycline , Mushroom extract complex (obsolete), and Tape  REVIEW OF SYSTEMS: All systems negative except as listed in HPI, PMH and Problem list.  I reviewed the LVAD parameters from today, and compared the results to the patient's prior recorded data.  No programming changes were made.  The LVAD is functioning within specified parameters.  The patient performs LVAD self-test daily.  LVAD interrogation was negative for any significant power changes, alarms or PI events/speed drops.  LVAD equipment check completed and is in good working order.  Back-up equipment present.   LVAD education done on emergency procedures and precautions and reviewed exit site care.    Vitals:   05/30/24 1119 05/30/24 1120  BP: (!) 98/0 119/83  Pulse: 75   SpO2: 99%   Weight: 86.6 kg (191 lb)   MAP 95  Physical Exam: General: Well appearing this am. NAD.  HEENT: Normal. Neck: Supple, JVP 12-14 cm with prominent CV waves. Carotids OK.  Cardiac:  Mechanical heart  sounds with LVAD hum present.  Lungs:  CTAB, normal effort.  Abdomen:  NT, ND, no HSM. No bruits or masses. +BS  LVAD exit site: Well-healed and incorporated. Dressing dry and intact. No erythema or drainage. Stabilization device present and accurately applied. Driveline dressing changed daily per sterile technique. Extremities:  Warm and dry. No cyanosis, clubbing, rash. 1+ ankle edema.  Neuro:  Alert & oriented x 3. Cranial nerves grossly intact. Moves all 4 extremities w/o difficulty. Affect pleasant    ASSESSMENT AND PLAN: 1. 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  Melena has resolved.  - CBC today.  - Continue Protonix  40 mg daily.  - I recommended that he start monthly octreotide  infusions to decrease risk for recurrent AVM bleeding.  He does not want to start this yet but is willing to start it if he has recurrent bleeding.  - Iron  deficiency, will give IV Fe.  2. Chronic systolic CHF:  Long-standing cardiomyopathy.  [employer] dual chamber ICD.  Echo in 12/22 with EF < 20%, severe LV dilation, restrictive diastolic function, moderate RV dysfunction, moderate MR, mod-severe TR. Cause of cardiomyopathy is uncertain.  He has a history of CAD, but I do not think that the described CAD from the past could explain his cardiomyopathy, but CAD could have progressed.  He was admitted in 12/22 with cardiogenic shock and Impella 5.5. s/p HM III VAD on 09/17/22.  Post-op course complicated by AKI and RV failure. He was on HD for several months but is now off.  Echo in 7/24 showed EF < 20%, IV septum midline to slightly leftward, RV moderately dilated with moderately decreased systolic function, IVC dilated with severe TR.  Echo in 5/25 with EF 20-25%, mild LVH, LVAD present, mild RV dysfunction, moderate AI, mild RV dysfunction with normal RV size, severe TR.  NYHA class II but volume overloaded on exam today. LVAD parameters reviewed today and stable, no alarms.  - Increase  Lasix  to 60 mg daily. BMET/BNP today and BMET in 10 days.  -  Continue sildenafil  20 mg tid.  - Continue warfarin, INR goal decreased to 2-2.5 with GI bleeding.  3. Tricuspid regurgitation:  Tricuspid repair not done at time of VAD due to proximity of ICD wires and hypotension during surgery. He has severe TR.  4. Atrial fibrillation: Paroxysmal.  He is off amiodarone  with resolution of nausea.  I will not restart it.  He has been a-paced recently.  5. CKD stage IV: Suspect intra-op hypotension at the time of LVAD placement led to development of ATN => urine sediment looked like ATN per renal. Had initial CVVH and transitioned over to HD. He is now off HD.  - BMET today.  6. CAD: History of PCI to OM1 in 2007 and RCA in 2013.  No CP or ACs.  - Deferred cardiac cath in 12/22 due to AKI and plan for VAD - Continue Crestor .   7. Abnormal thyroid  function tests: Patient has had elevated TSH and free T4, mixed pattern. We have stopped amiodarone . 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  Most recent TSH was normal.  - He follows with endocrinology.   8. Nausea: This seems to have been due to amiodarone , resolved off amiodarone .   9. 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  INR goal increased to 2.5-3 but then decreased back to 2-2.5 with GI bleeding.  10. I would like him to increase his activity, ok to work with a Systems analyst.   Followup in 2 months.  I spent 41 minutes reviewing records, interviewing/examining patient, and managing orders.    Ezra Shuck 05/30/2024

## 2024-05-30 NOTE — Telephone Encounter (Signed)
 Patient referred to infusion pharmacy team for ambulatory infusion of IV iron .  Insurance - Advertising copywriter of care - Site of care: [facility] Dx code - Z95.811/D50.9 IV Iron  Therapy - Venofer  200 mg IV x 4. Patient received Venofer  200 mg IV x 1.  Infusion appointments - Scheduling team will schedule patient as soon as possible.    Isaac [name] D. Jalana Moore, PharmD

## 2024-05-30 NOTE — Telephone Encounter (Signed)
 Auth Submission: NO AUTH NEEDED Site of care: [facility] Payer: [employer] Medicare Medication & CPT/J Code(s) submitted: Venofer  (Iron  Sucrose) J1756 Diagnosis Code: D50.9, Z95.811 Route of submission (phone, fax, portal):  Phone # Fax # Auth type: Buy/Bill HB Units/visits requested: 200mg  x 4 doses  Reference number:  Approval from: 05/30/24 to 09/20/24     Dagoberto Armour, CPhT [facility] Phone: [phone] 05/30/2024

## 2024-05-30 NOTE — Patient Instructions (Signed)
 Increase Lasix  to 60 mg daily Coumadin  dosing per Tinnie PharmD Return to clinic in 2 months to see Dr Rolan Please go to [facility] lab 9/17 for repeat BMET

## 2024-06-01 ENCOUNTER — Ambulatory Visit: Admitting: Speech Pathology

## 2024-06-01 ENCOUNTER — Ambulatory Visit: Admitting: Physical Therapy

## 2024-06-01 ENCOUNTER — Encounter

## 2024-06-01 ENCOUNTER — Other Ambulatory Visit (HOSPITAL_COMMUNITY): Payer: Self-pay

## 2024-06-01 DIAGNOSIS — Z95811 Presence of heart assist device: Secondary | ICD-10-CM

## 2024-06-01 DIAGNOSIS — I5022 Chronic systolic (congestive) heart failure: Secondary | ICD-10-CM

## 2024-06-02 ENCOUNTER — Inpatient Hospital Stay (HOSPITAL_COMMUNITY)
Admission: RE | Admit: 2024-06-02 | Discharge: 2024-06-02 | Disposition: A | Discharge status: Home / Self Care | Source: Ambulatory Visit | Attending: Cardiology

## 2024-06-02 VITALS — BP 94/78 | HR 57 | Temp 97.7°F | Resp 16

## 2024-06-02 DIAGNOSIS — D509 Iron deficiency anemia, unspecified: Secondary | ICD-10-CM | POA: Diagnosis present

## 2024-06-02 DIAGNOSIS — Z95811 Presence of heart assist device: Secondary | ICD-10-CM

## 2024-06-02 MED ORDER — IRON SUCROSE 200 MG IVPB - SIMPLE MED
200.0000 mg | Freq: Once | Status: AC
  Administered 2024-06-02: 200 mg via INTRAVENOUS
  Filled 2024-06-02: qty 200

## 2024-06-05 ENCOUNTER — Ambulatory Visit (HOSPITAL_COMMUNITY): Payer: Self-pay | Admitting: Pharmacist

## 2024-06-05 LAB — POCT INR: INR: 3.5 — AB (ref 2.0–3.0)

## 2024-06-06 ENCOUNTER — Encounter

## 2024-06-06 ENCOUNTER — Ambulatory Visit: Admitting: Physical Therapy

## 2024-06-06 ENCOUNTER — Encounter (HOSPITAL_COMMUNITY)
Admission: RE | Admit: 2024-06-06 | Discharge: 2024-06-06 | Disposition: A | Discharge status: Home / Self Care | Source: Ambulatory Visit | Attending: Cardiology

## 2024-06-06 ENCOUNTER — Ambulatory Visit: Admitting: Speech Pathology

## 2024-06-06 VITALS — BP 98/64 | HR 50 | Temp 97.5°F | Resp 16

## 2024-06-06 DIAGNOSIS — D509 Iron deficiency anemia, unspecified: Secondary | ICD-10-CM | POA: Diagnosis not present

## 2024-06-06 DIAGNOSIS — Z95811 Presence of heart assist device: Secondary | ICD-10-CM

## 2024-06-06 MED ORDER — IRON SUCROSE 200 MG IVPB - SIMPLE MED
200.0000 mg | Freq: Once | Status: AC
  Administered 2024-06-06: 200 mg via INTRAVENOUS
  Filled 2024-06-06: qty 200

## 2024-06-08 ENCOUNTER — Ambulatory Visit: Admitting: Speech Pathology

## 2024-06-08 ENCOUNTER — Encounter

## 2024-06-08 ENCOUNTER — Encounter (HOSPITAL_COMMUNITY)
Admission: RE | Admit: 2024-06-08 | Discharge: 2024-06-08 | Disposition: A | Discharge status: Home / Self Care | Source: Ambulatory Visit | Attending: Cardiology

## 2024-06-08 ENCOUNTER — Ambulatory Visit: Admitting: Physical Therapy

## 2024-06-08 VITALS — BP 101/77 | HR 60 | Temp 97.8°F | Resp 17 | Wt 180.0 lb

## 2024-06-08 DIAGNOSIS — Z95811 Presence of heart assist device: Secondary | ICD-10-CM

## 2024-06-08 DIAGNOSIS — D509 Iron deficiency anemia, unspecified: Secondary | ICD-10-CM

## 2024-06-08 MED ORDER — SODIUM CHLORIDE 0.9 % IV SOLN
200.0000 mg | Freq: Once | INTRAVENOUS | Status: AC
  Administered 2024-06-08: 200 mg via INTRAVENOUS
  Filled 2024-06-08: qty 200

## 2024-06-12 ENCOUNTER — Inpatient Hospital Stay (HOSPITAL_COMMUNITY): Admission: RE | Admit: 2024-06-12 | Source: Ambulatory Visit

## 2024-06-13 ENCOUNTER — Ambulatory Visit: Admitting: Speech Pathology

## 2024-06-13 ENCOUNTER — Encounter

## 2024-06-13 ENCOUNTER — Ambulatory Visit: Admitting: Physical Therapy

## 2024-06-14 NOTE — Progress Notes (Signed)
Remote ICD Transmission.

## 2024-06-15 ENCOUNTER — Ambulatory Visit: Admitting: Speech Pathology

## 2024-06-15 ENCOUNTER — Ambulatory Visit: Admitting: Physical Therapy

## 2024-06-15 ENCOUNTER — Encounter

## 2024-06-16 ENCOUNTER — Other Ambulatory Visit (HOSPITAL_COMMUNITY): Payer: Self-pay

## 2024-06-16 ENCOUNTER — Ambulatory Visit (HOSPITAL_COMMUNITY): Payer: Self-pay | Admitting: Pharmacist

## 2024-06-16 LAB — POCT INR: INR: 2.1 (ref 2.0–3.0)

## 2024-06-16 MED ORDER — ALLOPURINOL 100 MG PO TABS: 50.0000 mg | ORAL_TABLET | Freq: Every day | ORAL | 0 refills | Status: DC

## 2024-06-16 MED ORDER — PREDNISONE 10 MG PO TABS
20.0000 mg | ORAL_TABLET | Freq: Every day | ORAL | 0 refills | Status: AC
Start: 2024-06-16 — End: 2024-06-23

## 2024-06-16 NOTE — Telephone Encounter (Signed)
 Pt paged VAD Coordinator reporting acute gout flare. States he has tried to manage it on his own but it continues to get worse. Discussed with Dr. Rolan. Orders received for Prednisone  20mg  daily for 7 days and Allopurinol  50mg  daily (renal dosed per pharmacy). Prescription sent to [facility] in [place]. Pt called with instructions and verbalized understanding.  Schuyler Lunger RN, BSN VAD Coordinator 24/7 Pager [phone]

## 2024-06-19 ENCOUNTER — Ambulatory Visit: Admitting: Neurology

## 2024-06-19 ENCOUNTER — Encounter: Payer: Self-pay | Admitting: Neurology

## 2024-06-19 VITALS — BP 100/70 | HR 50

## 2024-06-19 DIAGNOSIS — H53461 Homonymous bilateral field defects, right side: Secondary | ICD-10-CM | POA: Diagnosis not present

## 2024-06-19 DIAGNOSIS — I639 Cerebral infarction, unspecified: Secondary | ICD-10-CM

## 2024-06-19 DIAGNOSIS — Z95811 Presence of heart assist device: Secondary | ICD-10-CM | POA: Diagnosis not present

## 2024-06-19 NOTE — Progress Notes (Signed)
 [facility] [address] [place]. [place] [address] [phone]       OFFICE FOLLOW-UP NOTE  Mr. Isaac [name] Date of Birth:  05-19-1951 Medical Record Number:  [id number]   HPI: Mr. Isaac [name] is a 73 year old pleasant African-American male seen today for initial office follow-up visit following hospital consultation for stroke in May 2025.  He is accompanied by his wife.  History is obtained from them and review of electronic medical records.  I personally reviewed pertinent available imaging films in PACS.  He has past medical history of coronary artery disease, hypertension, atrial fibrillation, chronic kidney disease, ventricular tachycardia, chronic systolic heart failure status post LVAD device in December 2022 on long-term anticoagulation with warfarin.  He presented on 02/05/2024 to the hospital emergency room after being found on the ground and confused with evidence of coffee-ground emesis.  EMS noticed right-sided visual loss with some dysmetria and weakness on the right side.  Code stroke was activated in the field.  CT head on admission showed early infarct in the left PCA distribution in the left cerebellum and hyperdense left posterior cerebral artery sign.  Emergent CT angiogram confirmed left P2/P3 occlusion with some distal flow and moderate calcific occasion in carotid siphons bilaterally.  Patient was felt not to be amenable to mechanical thrombectomy.  INR was 2.1 on admission.  He was admitted to the ICU and has been monitored.  He was found to have aphasia and has right-sided vision loss and right hemiparesis but is deficits gradually improved.  Echocardiogram showed ejection fraction 25 to 30% with LVAD at the apex with moderate left atrial dilatation.  LDL was 73 mg percent and hemoglobin A1c 5.3.  Patient was seen by physical occupational and speech therapy.  He states he has done well since discharge.  His speech has improved he is able to speak normally and  speech therapy is signed off.  He has noted improvement in his right-sided peripheral vision though it is not back to normal.  He has trouble reading and writing.  He is able to ambulate independently but a week ago he developed gout in his right foot had some trouble walking because of that.  He remains on warfarin with his INR being quite fluctuating however has had no bleeding or bruising.  Remains on Crestor  which is tolerating well without muscle aches and pains.  He has had no recurrent stroke or TIA symptoms.  He does have his regular follow-up in cardiology heart failure clinic  ROS:   14 system review of systems is positive for vision loss, difficulty walking, ankle pain, memory difficulties, bruising all other systems negative  PMH:  Past Medical History:  Diagnosis Date   Arrhythmia    atrial fibrillation   CHF (congestive heart failure) (HCC)    Chronic kidney disease    Coronary artery disease    Hyperlipidemia    Hypertension    Myocardial infarct [facility])     Social History:  Social History   Socioeconomic History   Marital status: Married    Spouse name: Not on file   Number of children: Not on file   Years of education: Not on file   Highest education level: Not on file  Occupational History   Not on file  Tobacco Use   Smoking status: Former   Smokeless tobacco: Never  Vaping Use   Vaping status: Never Used  Substance and Sexual Activity   Alcohol use: No   Drug use: No  Sexual activity: Not on file  Other Topics Concern   Not on file  Social History Narrative   Not on file   Social Drivers of Health   Financial Resource Strain: Not on file  Food Insecurity: No Food Insecurity (03/21/2024)   Hunger Vital Sign    Worried About Running Out of Food in the Last Year: Never true    Ran Out of Food in the Last Year: Never true  Transportation Needs: No Transportation Needs (03/21/2024)   PRAPARE - Administrator, Civil Service (Medical): No     Lack of Transportation (Non-Medical): No  Physical Activity: Not on file  Stress: Not on file  Social Connections: Moderately Integrated (03/21/2024)   Social Connection and Isolation Panel    Frequency of Communication with Friends and Family: More than three times a week    Frequency of Social Gatherings with Friends and Family: More than three times a week    Attends Religious Services: Never    Database administrator or Organizations: Yes    Attends Banker Meetings: 1 to 4 times per year    Marital Status: Married  Recent Concern: Social Connections - Moderately Isolated (02/05/2024)   Social Connection and Isolation Panel    Frequency of Communication with Friends and Family: Never    Frequency of Social Gatherings with Friends and Family: Never    Attends Religious Services: Never    Database administrator or Organizations: Yes    Attends Banker Meetings: 1 to 4 times per year    Marital Status: Married  Catering manager Violence: Not At Risk (03/21/2024)   Humiliation, Afraid, Rape, and Kick questionnaire    Fear of Current or Ex-Partner: No    Emotionally Abused: No    Physically Abused: No    Sexually Abused: No    Medications:   Current Outpatient Medications on File Prior to Visit  Medication Sig Dispense Refill   allopurinol  (ZYLOPRIM ) 100 MG tablet Take 0.5 tablets (50 mg total) by mouth daily. 15 tablet 0   furosemide  (LASIX ) 20 MG tablet Take 3 tablets (60 mg total) by mouth daily. 90 tablet 11   magnesium  oxide (MAG-OX) 400 (240 Mg) MG tablet Take 1 tablet (400 mg total) by mouth daily. 30 tablet 0   Multiple Vitamins-Minerals (ONE A DAY MEN 50 PLUS PO) Take 1 tablet by mouth daily.     pantoprazole  (PROTONIX ) 40 MG tablet Take twice a day for 1 month and then can decrease back to once daily. 60 tablet 1   potassium chloride  SA (KLOR-CON  M) 10 MEQ tablet Take 2 tablets (20 mEq total) by mouth daily. 180 tablet 3   predniSONE  (DELTASONE ) 10 MG  tablet Take 2 tablets (20 mg total) by mouth daily with breakfast for 7 days. 14 tablet 0   rosuvastatin  (CRESTOR ) 10 MG tablet Take 1 tablet (10 mg total) by mouth at bedtime. 30 tablet 0   sildenafil  (REVATIO ) 20 MG tablet Take 1 tablet (20 mg total) by mouth 3 (three) times daily. 90 tablet 0   warfarin (COUMADIN ) 2 MG tablet Take 2 mg by mouth every Tuesday, Thursday, and Saturday and 4 mg all other days 40 tablet 11   acetaminophen  (TYLENOL ) 325 MG tablet Take 2 tablets (650 mg total) by mouth every 4 (four) hours as needed for headache or mild pain (pain score 1-3). (Patient not taking: Reported on 05/30/2024)     No current facility-administered medications  on file prior to visit.    Allergies:   Allergies  Allergen Reactions   Neosporin [Neomycin-Bacitracin Zn-Polymyx] Hives   Doxycycline  Nausea And Vomiting   Mushroom Extract Complex (Obsolete) Nausea And Vomiting   Tape Other (See Comments)    Some tapes/dressings can irritate the skin    Physical Exam General: well developed, well nourished, seated, in no evident distress Head: head normocephalic and atraumatic.  Neck: supple with no carotid or supraclavicular bruits Cardiovascular: LVAD hum sounds heard  musculoskeletal: no deformity painful swollen right ankle and foot from gout Skin:  no rash/petichiae Vascular:  Normal pulses all extremities Vitals:   06/19/24 1123  BP: 100/70  Pulse: (!) 50   Neurologic Exam Mental Status: Awake and fully alert. Oriented to place and time. Recent and remote memory intact. Attention span, concentration and fund of knowledge appropriate. Mood and affect appropriate.  Diminished recall 0/3.  Clock drawing 4/4.  Able to name 10 animals which can walk on forelegs. Cranial Nerves: Fundoscopic exam reveals sharp disc margins. Pupils equal, briskly reactive to light. Extraocular movements full without nystagmus. Visual fields show partial right homonymous hemianopsia to confrontation. Hearing  intact. Facial sensation intact. Face, tongue, palate moves normally and symmetrically.  Motor: Normal bulk and tone. Normal strength in all tested extremity muscles.  Mild right grip weakness.  Diminished fine finger movements on the right.  PICC left to right upper extremity. Sensory.: intact to touch ,pinprick .position and vibratory sensation.  Coordination: Rapid alternating movements normal in all extremities. Finger-to-nose and heel-to-shin performed accurately bilaterally. Gait and Station: Arises from chair without difficulty. Stance is normal. Gait deferred as patient is in a wheelchair due to acute gout in the right foot. Reflexes: 1+ and symmetric. Toes downgoing.   NIHSS  1 Modified Rankin  2   ASSESSMENT: 73 year old African-American male with cardioembolic left cerebellar and PCA infarcts and May 2025 due to LVAD device despite anticoagulation with warfarin.  He has shown modest improvement but still has some mild right-sided peripheral vision loss and mild cognitive impairment.     PLAN:I had a long d/w patient and his wife about his recent cardioembolic stroke, residual right sided peripheral vision loss,risk for recurrent stroke/TIAs, personally independently reviewed imaging studies and stroke evaluation results and answered questions.Continue warfarin daily  for secondary stroke prevention given his LVAD with target INR 2.5-3.0 and maintain strict control of hypertension with blood pressure goal below 130/90, diabetes with hemoglobin A1c goal below 6.5% and lipids with LDL cholesterol goal below 70 mg/dL. I also advised the patient to eat a healthy diet with plenty of whole grains, cereals, fruits and vegetables, exercise regularly and maintain ideal body weight .  I advised him to see his optometrist to do computerized visual field testing to see if he has regained enough visual field to be able to drive.  He was advised to limit his driving and to turn his neck to the right when  changing lanes..Followup in the future with me only as needed.   I personally spent a total of 35 minutes in the care of the patient today including getting/reviewing separately obtained history, performing a medically appropriate exam/evaluation, counseling and educating, placing orders, referring and communicating with other health care professionals, documenting clinical information in the EHR, independently interpreting results, and coordinating care.        Eather Popp, MD Note: This document was prepared with digital dictation and possible smart phrase technology. Any transcriptional errors that result from this process  are unintentional

## 2024-06-19 NOTE — Patient Instructions (Addendum)
 I had a long d/w patient and his wife about his recent cardioembolic stroke, residual right sided peripheral vision loss,risk for recurrent stroke/TIAs, personally independently reviewed imaging studies and stroke evaluation results and answered questions.Continue warfarin daily  for secondary stroke prevention given his LVAD with target INR 2.5-3.0 and maintain strict control of hypertension with blood pressure goal below 130/90, diabetes with hemoglobin A1c goal below 6.5% and lipids with LDL cholesterol goal below 70 mg/dL. I also advised the patient to eat a healthy diet with plenty of whole grains, cereals, fruits and vegetables, exercise regularly and maintain ideal body weight .  I advised him to see his optometrist to do computerized visual field testing to see if he has regained enough visual field to be able to drive.  He was advised to limit his driving and to turn his neck to the right when changing lanes..Followup in the future with me only as needed.

## 2024-06-20 ENCOUNTER — Ambulatory Visit: Admitting: Physical Therapy

## 2024-06-20 ENCOUNTER — Encounter

## 2024-06-20 ENCOUNTER — Ambulatory Visit: Admitting: Speech Pathology

## 2024-06-22 ENCOUNTER — Ambulatory Visit: Admitting: Speech Pathology

## 2024-06-22 ENCOUNTER — Encounter

## 2024-06-22 ENCOUNTER — Ambulatory Visit

## 2024-06-23 ENCOUNTER — Ambulatory Visit (HOSPITAL_COMMUNITY): Payer: Self-pay | Admitting: Pharmacist

## 2024-06-23 LAB — POCT INR: INR: 4 — AB (ref 2.0–3.0)

## 2024-06-27 ENCOUNTER — Encounter

## 2024-06-27 ENCOUNTER — Ambulatory Visit: Admitting: Speech Pathology

## 2024-06-27 ENCOUNTER — Ambulatory Visit: Admitting: Physical Therapy

## 2024-06-29 ENCOUNTER — Encounter

## 2024-06-29 ENCOUNTER — Ambulatory Visit: Admitting: Physical Therapy

## 2024-06-29 ENCOUNTER — Ambulatory Visit: Admitting: Speech Pathology

## 2024-06-30 ENCOUNTER — Telehealth (HOSPITAL_COMMUNITY): Payer: Self-pay | Admitting: Pharmacist

## 2024-06-30 ENCOUNTER — Ambulatory Visit: Payer: Self-pay | Admitting: Pharmacist

## 2024-06-30 LAB — POCT INR: INR: 1.9 — AB (ref 2.0–3.0)

## 2024-06-30 NOTE — Telephone Encounter (Signed)
 Called for reminder of INR check due yesterday. Spoke with patient's wife who agreed to check today.

## 2024-07-01 ENCOUNTER — Other Ambulatory Visit: Payer: Self-pay

## 2024-07-01 ENCOUNTER — Emergency Department (HOSPITAL_COMMUNITY)

## 2024-07-01 ENCOUNTER — Encounter (HOSPITAL_COMMUNITY): Payer: Self-pay | Admitting: *Deleted

## 2024-07-01 ENCOUNTER — Emergency Department (HOSPITAL_COMMUNITY)
Admission: EM | Admit: 2024-07-01 | Discharge: 2024-07-01 | Disposition: A | Discharge status: Home / Self Care | Source: Ambulatory Visit | Attending: Emergency Medicine | Admitting: Emergency Medicine

## 2024-07-01 DIAGNOSIS — B9689 Other specified bacterial agents as the cause of diseases classified elsewhere: Secondary | ICD-10-CM | POA: Insufficient documentation

## 2024-07-01 DIAGNOSIS — I509 Heart failure, unspecified: Secondary | ICD-10-CM | POA: Insufficient documentation

## 2024-07-01 DIAGNOSIS — N3000 Acute cystitis without hematuria: Secondary | ICD-10-CM | POA: Diagnosis not present

## 2024-07-01 DIAGNOSIS — R079 Chest pain, unspecified: Secondary | ICD-10-CM | POA: Insufficient documentation

## 2024-07-01 DIAGNOSIS — Z95811 Presence of heart assist device: Secondary | ICD-10-CM | POA: Insufficient documentation

## 2024-07-01 DIAGNOSIS — I130 Hypertensive heart and chronic kidney disease with heart failure and stage 1 through stage 4 chronic kidney disease, or unspecified chronic kidney disease: Secondary | ICD-10-CM | POA: Insufficient documentation

## 2024-07-01 DIAGNOSIS — R0602 Shortness of breath: Secondary | ICD-10-CM | POA: Insufficient documentation

## 2024-07-01 DIAGNOSIS — N390 Urinary tract infection, site not specified: Secondary | ICD-10-CM | POA: Insufficient documentation

## 2024-07-01 DIAGNOSIS — Z7901 Long term (current) use of anticoagulants: Secondary | ICD-10-CM | POA: Insufficient documentation

## 2024-07-01 DIAGNOSIS — N189 Chronic kidney disease, unspecified: Secondary | ICD-10-CM | POA: Insufficient documentation

## 2024-07-01 DIAGNOSIS — R509 Fever, unspecified: Secondary | ICD-10-CM

## 2024-07-01 LAB — CBC WITH DIFFERENTIAL/PLATELET
Abs Immature Granulocytes: 0.08 K/uL — ABNORMAL HIGH (ref 0.00–0.07)
Basophils Absolute: 0 K/uL (ref 0.0–0.1)
Basophils Relative: 0 %
Eosinophils Absolute: 0.1 K/uL (ref 0.0–0.5)
Eosinophils Relative: 1 %
HCT: 35.5 % — ABNORMAL LOW (ref 39.0–52.0)
Hemoglobin: 10.9 g/dL — ABNORMAL LOW (ref 13.0–17.0)
Immature Granulocytes: 1 %
Lymphocytes Relative: 4 %
Lymphs Abs: 0.5 K/uL — ABNORMAL LOW (ref 0.7–4.0)
MCH: 25.5 pg — ABNORMAL LOW (ref 26.0–34.0)
MCHC: 30.7 g/dL (ref 30.0–36.0)
MCV: 82.9 fL (ref 80.0–100.0)
Monocytes Absolute: 1.1 K/uL — ABNORMAL HIGH (ref 0.1–1.0)
Monocytes Relative: 9 %
Neutro Abs: 10.7 K/uL — ABNORMAL HIGH (ref 1.7–7.7)
Neutrophils Relative %: 85 %
Platelets: 214 K/uL (ref 150–400)
RBC: 4.28 MIL/uL (ref 4.22–5.81)
RDW: 18.7 % — ABNORMAL HIGH (ref 11.5–15.5)
WBC: 12.6 K/uL — ABNORMAL HIGH (ref 4.0–10.5)
nRBC: 0 % (ref 0.0–0.2)

## 2024-07-01 LAB — URINALYSIS, W/ REFLEX TO CULTURE (INFECTION SUSPECTED)
Bilirubin Urine: NEGATIVE
Glucose, UA: NEGATIVE mg/dL
Ketones, ur: NEGATIVE mg/dL
Nitrite: NEGATIVE
Protein, ur: 30 mg/dL — AB
RBC / HPF: >50 RBC/hpf (ref 0–5)
Specific Gravity, Urine: 1.01 (ref 1.005–1.030)
WBC, UA: >50 WBC/hpf (ref 0–5)
pH: 5 (ref 5.0–8.0)

## 2024-07-01 LAB — COMPREHENSIVE METABOLIC PANEL WITH GFR
ALT: 21 U/L (ref 0–44)
AST: 25 U/L (ref 15–41)
Albumin: 2.5 g/dL — ABNORMAL LOW (ref 3.5–5.0)
Alkaline Phosphatase: 129 U/L — ABNORMAL HIGH (ref 38–126)
Anion gap: 11 (ref 5–15)
BUN: 27 mg/dL — ABNORMAL HIGH (ref 8–23)
CO2: 21 mmol/L — ABNORMAL LOW (ref 22–32)
Calcium: 9 mg/dL (ref 8.9–10.3)
Chloride: 104 mmol/L (ref 98–111)
Creatinine, Ser: 2.02 mg/dL — ABNORMAL HIGH (ref 0.61–1.24)
GFR, Estimated: 34 mL/min — ABNORMAL LOW (ref >60)
Glucose, Bld: 95 mg/dL (ref 70–99)
Potassium: 3.7 mmol/L (ref 3.5–5.1)
Sodium: 136 mmol/L (ref 135–145)
Total Bilirubin: 2.7 mg/dL — ABNORMAL HIGH (ref 0.0–1.2)
Total Protein: 5.7 g/dL — ABNORMAL LOW (ref 6.5–8.1)

## 2024-07-01 LAB — LACTATE DEHYDROGENASE: LDH: 142 U/L (ref 98–192)

## 2024-07-01 LAB — RESP PANEL BY RT-PCR (RSV, FLU A&B, COVID)  RVPGX2
Influenza A by PCR: NEGATIVE
Influenza B by PCR: NEGATIVE
Resp Syncytial Virus by PCR: NEGATIVE
SARS Coronavirus 2 by RT PCR: NEGATIVE

## 2024-07-01 LAB — CK: Total CK: 27 U/L — ABNORMAL LOW (ref 49–397)

## 2024-07-01 LAB — TROPONIN I (HIGH SENSITIVITY)
Troponin I (High Sensitivity): 58 ng/L — ABNORMAL HIGH (ref <18)
Troponin I (High Sensitivity): 61 ng/L — ABNORMAL HIGH (ref <18)

## 2024-07-01 LAB — I-STAT CG4 LACTIC ACID, ED: Lactic Acid, Venous: 1 mmol/L (ref 0.5–1.9)

## 2024-07-01 MED ORDER — CIPROFLOXACIN HCL 500 MG PO TABS: 500.0000 mg | ORAL_TABLET | Freq: Two times a day (BID) | ORAL | 0 refills | Status: DC

## 2024-07-01 MED ORDER — SODIUM CHLORIDE 0.9 % IV SOLN
1.0000 g | Freq: Once | INTRAVENOUS | Status: AC
  Administered 2024-07-01: 1 g via INTRAVENOUS
  Filled 2024-07-01: qty 10

## 2024-07-01 MED ORDER — SODIUM CHLORIDE 0.9 % IV SOLN: INTRAVENOUS | Status: DC

## 2024-07-01 NOTE — ED Notes (Signed)
 Pt complaining of chills. Pt temp 99.6. Explained to patient that I can have the MD come and do another assessment. Pt declined assessment and would like to go home.

## 2024-07-01 NOTE — ED Triage Notes (Signed)
 Pt is LVAD pt here for urinary frequency since Wed and temp of 101.3 today.   Pt also c/o intermittent chest pain and sob.

## 2024-07-01 NOTE — ED Provider Notes (Addendum)
 [place] EMERGENCY DEPARTMENT AT [facility] Provider Note   CSN: [id number] Arrival date & time: 07/01/24  1631     Patient presents with: Fever, Urinary Frequency, Chest Pain, and LVAD/fever   Isaac [name] is a 73 y.o. male.   Patient is an LVAD patient here for urinary frequency since Wednesday temp at home was 101.3 today.  Patient also complaining of intermittent chest pain and shortness of breath.  Temp on arrival here was 100 pulse 60 respirations 18 oxygen saturation is 95%.  Blood pressure has been anything from 92-1 13 systolic.  Patient is on Coumadin .  Patient states his urine has been very dark.  Past medical history sniffer congestive heart failure hypertension myocardial infarction coronary disease chronic kidney disease hyperlipidemia.  Patient was evaluated by the LVAD nurse.       Prior to Admission medications   Medication Sig Start Date End Date Taking? Authorizing Provider  acetaminophen  (TYLENOL ) 325 MG tablet Take 2 tablets (650 mg total) by mouth every 4 (four) hours as needed for headache or mild pain (pain score 1-3). Patient not taking: Reported on 05/30/2024 02/18/24   Angiulli, Toribio PARAS, PA-C  allopurinol  (ZYLOPRIM ) 100 MG tablet Take 0.5 tablets (50 mg total) by mouth daily. 06/16/24   Rolan Ezra RAMAN, MD  furosemide  (LASIX ) 20 MG tablet Take 3 tablets (60 mg total) by mouth daily. 05/30/24   Rolan Ezra RAMAN, MD  magnesium  oxide (MAG-OX) 400 (240 Mg) MG tablet Take 1 tablet (400 mg total) by mouth daily. 02/18/24   Angiulli, Toribio PARAS, PA-C  Multiple Vitamins-Minerals (ONE A DAY MEN 50 PLUS PO) Take 1 tablet by mouth daily.    [provider]  pantoprazole  (PROTONIX ) 40 MG tablet Take twice a day for 1 month and then can decrease back to once daily. 04/02/24   Goodrich, Callie E, PA-C  potassium chloride  SA (KLOR-CON  M) 10 MEQ tablet Take 2 tablets (20 mEq total) by mouth daily. 02/28/24   Rolan Ezra RAMAN, MD  rosuvastatin  (CRESTOR ) 10  MG tablet Take 1 tablet (10 mg total) by mouth at bedtime. 02/18/24   Angiulli, Toribio PARAS, PA-C  sildenafil  (REVATIO ) 20 MG tablet Take 1 tablet (20 mg total) by mouth 3 (three) times daily. 02/18/24   Angiulli, Toribio PARAS, PA-C  warfarin (COUMADIN ) 2 MG tablet Take 2 mg by mouth every Tuesday, Thursday, and Saturday and 4 mg all other days 03/10/24   Rolan Ezra RAMAN, MD    Allergies: Neosporin [neomycin-bacitracin zn-polymyx], Doxycycline , Mushroom extract complex (obsolete), and Tape    Review of Systems  Constitutional:  Positive for fever. Negative for chills.  HENT:  Positive for congestion. Negative for ear pain and sore throat.   Eyes:  Negative for pain and visual disturbance.  Respiratory:  Positive for cough. Negative for shortness of breath.   Cardiovascular:  Negative for chest pain and palpitations.  Gastrointestinal:  Negative for abdominal pain and vomiting.  Genitourinary:  Positive for difficulty urinating. Negative for dysuria and hematuria.  Musculoskeletal:  Negative for arthralgias and back pain.  Skin:  Negative for color change and rash.  Neurological:  Negative for seizures and syncope.  All other systems reviewed and are negative.   Updated Vital Signs BP 109/73   Pulse (!) 59   Temp 100 F (37.8 C) (Oral)   Resp 19   Ht 1.829 m (6')   Wt 84.8 kg   SpO2 100%   BMI 25.36 kg/m   Physical Exam  Vitals and nursing note reviewed.  Constitutional:      General: He is not in acute distress.    Appearance: Normal appearance. He is well-developed.  HENT:     Head: Normocephalic and atraumatic.     Mouth/Throat:     Mouth: Mucous membranes are moist.  Eyes:     Conjunctiva/sclera: Conjunctivae normal.  Cardiovascular:     Rate and Rhythm: Normal rate and regular rhythm.     Heart sounds: No murmur heard. Pulmonary:     Effort: Pulmonary effort is normal. No respiratory distress.     Breath sounds: Normal breath sounds.  Abdominal:     Palpations: Abdomen is  soft.     Tenderness: There is no abdominal tenderness.  Musculoskeletal:        General: No swelling.     Cervical back: Normal range of motion and neck supple. No rigidity.  Skin:    General: Skin is warm and dry.     Capillary Refill: Capillary refill takes less than 2 seconds.  Neurological:     General: No focal deficit present.     Mental Status: He is alert and oriented to person, place, and time.  Psychiatric:        Mood and Affect: Mood normal.     (all labs ordered are listed, but only abnormal results are displayed) Labs Reviewed  CBC WITH DIFFERENTIAL/PLATELET - Abnormal; Notable for the following components:      Result Value   WBC 12.6 (*)    Hemoglobin 10.9 (*)    HCT 35.5 (*)    MCH 25.5 (*)    RDW 18.7 (*)    Neutro Abs 10.7 (*)    Lymphs Abs 0.5 (*)    Monocytes Absolute 1.1 (*)    Abs Immature Granulocytes 0.08 (*)    All other components within normal limits  COMPREHENSIVE METABOLIC PANEL WITH GFR - Abnormal; Notable for the following components:   CO2 21 (*)    BUN 27 (*)    Creatinine, Ser 2.02 (*)    Total Protein 5.7 (*)    Albumin  2.5 (*)    Alkaline Phosphatase 129 (*)    Total Bilirubin 2.7 (*)    GFR, Estimated 34 (*)    All other components within normal limits  CK - Abnormal; Notable for the following components:   Total CK 27 (*)    All other components within normal limits  TROPONIN I (HIGH SENSITIVITY) - Abnormal; Notable for the following components:   Troponin I (High Sensitivity) 58 (*)    All other components within normal limits  RESP PANEL BY RT-PCR (RSV, FLU A&B, COVID)  RVPGX2  CULTURE, BLOOD (ROUTINE X 2)  CULTURE, BLOOD (ROUTINE X 2)  LACTATE DEHYDROGENASE  URINALYSIS, W/ REFLEX TO CULTURE (INFECTION SUSPECTED)  PROTIME-INR  I-STAT CG4 LACTIC ACID, ED  I-STAT CG4 LACTIC ACID, ED  TROPONIN I (HIGH SENSITIVITY)    EKG: None  Radiology: DG CHEST PORT 1 VIEW Result Date: 07/01/2024 CLINICAL DATA:  Chest pain and  fever.  History of LVAD. EXAM: PORTABLE CHEST 1 VIEW COMPARISON:  03/21/2024. FINDINGS: The heart is enlarged and an LVAD device is noted and appears stable in position. Sternotomy wires are present over the midline. There is a multi lead pacemaker device over the left chest. The mediastinal contour stable. Atherosclerotic calcification of the aorta is noted. No consolidation, effusion, or pneumothorax. No acute osseous abnormality. Surgical clips are noted in the subclavian region on the  right. IMPRESSION: Stable chest with no active disease. Electronically Signed   By: Leita Birmingham M.D.   On: 07/01/2024 18:00     Procedures   Medications Ordered in the ED  0.9 %  sodium chloride  infusion ( Intravenous New Bag/Given 07/01/24 1931)                                    Medical Decision Making Amount and/or Complexity of Data Reviewed Labs: ordered.  Risk Prescription drug management.   Patient LVAD patient.  I-STAT reassuring at 1 CBC white count 12.7 hemoglobin 10.9 platelets 214 complete metabolic panel GFR 34 total bili 2.7 alk phos 129 electrolytes normal anion gap normal.  LDH normal at 142 troponin initial 158 will need delta troponin.  Initial CK reassuring at 27.  So not markedly elevated.  Respiratory panel negative.  Portable chest x-ray no acute findings.  Delta troponin and urinalysis still pending.  The results of the urinalysis is significant because of the low-grade fevers.  Urine seems to be consistent with urinary tract infection.  There was greater than 50 RBCs greater than 50 white blood cells and many bacteria.  Will discuss with cardiology.  Will give a dose of Rocephin .  Patient without any abdominal pain.  Patient's renal function GFR was 34.  Will see if this is baseline.  Renal functions actually improved.  Will start Rocephin  and talk to cardiology.  Cardiology and LVAD nurse okay with IV Rocephin  but they are recommending Cipro  to go home with they will follow-up  his cultures and they will arrange follow-up.  Final diagnoses:  Fever, unspecified fever cause  LVAD (left ventricular assist device) present [facility])    ED Discharge Orders     None          Geraldene Hamilton, MD 07/01/24 [id number]    Geraldene Hamilton, MD 07/01/24 7856    Geraldene Hamilton, MD 07/01/24 7855    Geraldene Hamilton, MD 07/01/24 2152

## 2024-07-01 NOTE — ED Notes (Signed)
 Wife is adamant that blood cultures not be drawn on pt as it will cause him to lose too much blood.

## 2024-07-01 NOTE — Progress Notes (Signed)
 LVAD Coordinator ED Encounter  Isaac [name] a 73 y.o. male that presented to [facility] ER today due to fever 101, vomiting, diarrhea, tea-colored urine with frequency for the last few days. 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   Received page from pt's wife this afternoon ([id number]) reporting pt has not been feeling well x 2 days. Reports vomiting, diarrhea, fever 101, chills, and tea colored urine with frequency. Thinks he may have urinary tract infection. Recently had gout flair.   Pt arrived to ER 1712. Orders placed for labs, UA + culture, blood cultures, and chest xray. Spoke with bedside RN and provided with VAD pager #. Made aware that if pt requires admission, he must be admitted by Dr Zenaida (advanced heart failure provider).   WBC 12.6. Resp panel negative. Urine and blood cultures pending.   UA results:   Vital signs: Temp: 100.0 HR: 65 Doppler MAP: 98 Automated BP: 110/81 (90) O2 Sat: 95%  LVAD interrogation reveals:  Speed: 5300 Flow: 4.7 Power: 3.8 w PI: 3.0  Alarms: none Events: rare  Drive Line: Managed weekly by pt's wife.   Significant Events with LVAD: - Admitted in 3/24 with left groin abscess. This spontaneously drained and was treated with IV antibiotics. He was sent home on linezolid  and Augmentin . He developed profuse diarrhea that he attributed to linezolid . Linezolid  was stopped and he was continued on Augmentin .  - Admitted with dysarthria and right-sided weakness in 5/25.  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 Echo this admission showed EF 20-25%, mild LVH, LVAD present, mild RV dysfunction, moderate AI, mild RV dysfunction with normal RV size, severe TR.  INR goal  increased to 2.5-3. Dual chamber ICD generator was also changed this admission, needs pacing at times with bradycardia.   - Admitted 03/21/24 with GI bleeding/melena, he received a total of 7 units PRBCs.  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  INR goal was decreased to 2-2.5.    Updated VAD Providers (Dr Zenaida) about the above. No LVAD issues and pump is functioning as expected. Able to independently manage LVAD equipment. No LVAD needs at this time.    Discussed lab and UA results with Dr Zenaida and Dr Zachowski. Per providers- will give IV Rocephin  in ER. Will plan to discharge home with prescription for Cipro  while awaiting blood and urine culture results. VAD clinic f/u appt scheduled 07/06/24 at 1 PM.   Called and spoke with [name] about the above plan and appt information. Advised for her to notify VAD coordinator if pt condition worsens, or does not improve, while at home. She verbalized understanding, and agreement with plan.   Isaiah Knoll RN VAD Coordinator  Office: [phone]  24/7 Pager: [phone]

## 2024-07-01 NOTE — Discharge Instructions (Signed)
 Urine appears to be infected.  Cardiology would like for you to be treated with Cipro  as an outpatient they were fine with the IV Rocephin  here.  They will follow-up cultures.  They will be contacting you for follow-up this week.  Return for any new or worse symptoms.

## 2024-07-01 NOTE — ED Notes (Signed)
 Pt given ginger ale.

## 2024-07-01 NOTE — Progress Notes (Signed)
 1514- Received page from pt's wife reporting pt has not been feeling well x 2 days. Reports vomiting, diarrhea, fever 101, chills, and tea colored urine. Thinks he may have urinary tract infection. Recently had gout flair. VAD #s stable: speed 5300 flow 4.7 pi 2.9 power 3.9w. Advised to bring black bag with them to ER.   States he had an alarm yesterday but they were unsure what it was. Instructed her how to pull up last 6 alarms on controller. Power cable disconnects and low voltage alarm noted. States batteries were fully charged at time of alarm.  Discussed making sure cables are fully connected to clips/MPU connections prior to screwing connector down. Alarming has not occured today. She verbalized understanding.   Dr Zenaida aware of the above.   Isaiah Knoll RN VAD Coordinator  Office: [phone]  Pager: [phone]

## 2024-07-02 ENCOUNTER — Inpatient Hospital Stay (HOSPITAL_COMMUNITY)

## 2024-07-02 ENCOUNTER — Encounter (HOSPITAL_COMMUNITY): Payer: Self-pay

## 2024-07-02 ENCOUNTER — Other Ambulatory Visit (HOSPITAL_COMMUNITY)

## 2024-07-02 ENCOUNTER — Inpatient Hospital Stay (HOSPITAL_COMMUNITY)
Admission: EM | Admit: 2024-07-02 | Discharge: 2024-07-04 | DRG: 690 | Disposition: A | Discharge status: Home / Self Care | Attending: Cardiology | Admitting: Cardiology

## 2024-07-02 DIAGNOSIS — K5520 Angiodysplasia of colon without hemorrhage: Secondary | ICD-10-CM | POA: Diagnosis present

## 2024-07-02 DIAGNOSIS — E785 Hyperlipidemia, unspecified: Secondary | ICD-10-CM | POA: Diagnosis present

## 2024-07-02 DIAGNOSIS — R7881 Bacteremia: Secondary | ICD-10-CM | POA: Diagnosis present

## 2024-07-02 DIAGNOSIS — Z91048 Other nonmedicinal substance allergy status: Secondary | ICD-10-CM | POA: Diagnosis not present

## 2024-07-02 DIAGNOSIS — B9620 Unspecified Escherichia coli [E. coli] as the cause of diseases classified elsewhere: Secondary | ICD-10-CM | POA: Diagnosis present

## 2024-07-02 DIAGNOSIS — I2510 Atherosclerotic heart disease of native coronary artery without angina pectoris: Secondary | ICD-10-CM | POA: Diagnosis present

## 2024-07-02 DIAGNOSIS — Z9861 Coronary angioplasty status: Secondary | ICD-10-CM

## 2024-07-02 DIAGNOSIS — I252 Old myocardial infarction: Secondary | ICD-10-CM

## 2024-07-02 DIAGNOSIS — N3000 Acute cystitis without hematuria: Principal | ICD-10-CM | POA: Diagnosis present

## 2024-07-02 DIAGNOSIS — Z7901 Long term (current) use of anticoagulants: Secondary | ICD-10-CM

## 2024-07-02 DIAGNOSIS — R630 Anorexia: Secondary | ICD-10-CM | POA: Diagnosis present

## 2024-07-02 DIAGNOSIS — I5043 Acute on chronic combined systolic (congestive) and diastolic (congestive) heart failure: Secondary | ICD-10-CM

## 2024-07-02 DIAGNOSIS — Z79899 Other long term (current) drug therapy: Secondary | ICD-10-CM | POA: Diagnosis not present

## 2024-07-02 DIAGNOSIS — I480 Paroxysmal atrial fibrillation: Secondary | ICD-10-CM | POA: Diagnosis present

## 2024-07-02 DIAGNOSIS — R112 Nausea with vomiting, unspecified: Secondary | ICD-10-CM | POA: Diagnosis present

## 2024-07-02 DIAGNOSIS — I130 Hypertensive heart and chronic kidney disease with heart failure and stage 1 through stage 4 chronic kidney disease, or unspecified chronic kidney disease: Secondary | ICD-10-CM | POA: Diagnosis present

## 2024-07-02 DIAGNOSIS — Z8673 Personal history of transient ischemic attack (TIA), and cerebral infarction without residual deficits: Secondary | ICD-10-CM

## 2024-07-02 DIAGNOSIS — I071 Rheumatic tricuspid insufficiency: Secondary | ICD-10-CM | POA: Diagnosis present

## 2024-07-02 DIAGNOSIS — Z9109 Other allergy status, other than to drugs and biological substances: Secondary | ICD-10-CM | POA: Diagnosis not present

## 2024-07-02 DIAGNOSIS — E611 Iron deficiency: Secondary | ICD-10-CM | POA: Diagnosis present

## 2024-07-02 DIAGNOSIS — R531 Weakness: Secondary | ICD-10-CM | POA: Diagnosis present

## 2024-07-02 DIAGNOSIS — Z95811 Presence of heart assist device: Secondary | ICD-10-CM | POA: Diagnosis not present

## 2024-07-02 DIAGNOSIS — R410 Disorientation, unspecified: Secondary | ICD-10-CM

## 2024-07-02 DIAGNOSIS — I429 Cardiomyopathy, unspecified: Secondary | ICD-10-CM | POA: Diagnosis present

## 2024-07-02 DIAGNOSIS — Z881 Allergy status to other antibiotic agents status: Secondary | ICD-10-CM | POA: Diagnosis not present

## 2024-07-02 DIAGNOSIS — I5022 Chronic systolic (congestive) heart failure: Secondary | ICD-10-CM | POA: Diagnosis present

## 2024-07-02 DIAGNOSIS — N184 Chronic kidney disease, stage 4 (severe): Secondary | ICD-10-CM | POA: Diagnosis present

## 2024-07-02 DIAGNOSIS — N390 Urinary tract infection, site not specified: Secondary | ICD-10-CM | POA: Diagnosis present

## 2024-07-02 LAB — CBC WITH DIFFERENTIAL/PLATELET
Abs Immature Granulocytes: 0.04 K/uL (ref 0.00–0.07)
Basophils Absolute: 0 K/uL (ref 0.0–0.1)
Basophils Relative: 0 %
Eosinophils Absolute: 0.3 K/uL (ref 0.0–0.5)
Eosinophils Relative: 3 %
HCT: 33.9 % — ABNORMAL LOW (ref 39.0–52.0)
Hemoglobin: 10.4 g/dL — ABNORMAL LOW (ref 13.0–17.0)
Immature Granulocytes: 0 %
Lymphocytes Relative: 4 %
Lymphs Abs: 0.5 K/uL — ABNORMAL LOW (ref 0.7–4.0)
MCH: 25.2 pg — ABNORMAL LOW (ref 26.0–34.0)
MCHC: 30.7 g/dL (ref 30.0–36.0)
MCV: 82.1 fL (ref 80.0–100.0)
Monocytes Absolute: 1.1 K/uL — ABNORMAL HIGH (ref 0.1–1.0)
Monocytes Relative: 10 %
Neutro Abs: 9.1 K/uL — ABNORMAL HIGH (ref 1.7–7.7)
Neutrophils Relative %: 83 %
Platelets: 211 K/uL (ref 150–400)
RBC: 4.13 MIL/uL — ABNORMAL LOW (ref 4.22–5.81)
RDW: 18.8 % — ABNORMAL HIGH (ref 11.5–15.5)
WBC: 11 K/uL — ABNORMAL HIGH (ref 4.0–10.5)
nRBC: 0 % (ref 0.0–0.2)

## 2024-07-02 LAB — BLOOD CULTURE ID PANEL (REFLEXED) - BCID2

## 2024-07-02 LAB — URINALYSIS, ROUTINE W REFLEX MICROSCOPIC
Bilirubin Urine: NEGATIVE
Glucose, UA: NEGATIVE mg/dL
Ketones, ur: NEGATIVE mg/dL
Nitrite: NEGATIVE
Protein, ur: 30 mg/dL — AB
RBC / HPF: >50 RBC/hpf (ref 0–5)
Specific Gravity, Urine: 1.012 (ref 1.005–1.030)
WBC, UA: >50 WBC/hpf (ref 0–5)
pH: 5 (ref 5.0–8.0)

## 2024-07-02 LAB — BASIC METABOLIC PANEL WITH GFR
Anion gap: 17 — ABNORMAL HIGH (ref 5–15)
BUN: 31 mg/dL — ABNORMAL HIGH (ref 8–23)
CO2: 21 mmol/L — ABNORMAL LOW (ref 22–32)
Calcium: 8.8 mg/dL — ABNORMAL LOW (ref 8.9–10.3)
Chloride: 104 mmol/L (ref 98–111)
Creatinine, Ser: 2.07 mg/dL — ABNORMAL HIGH (ref 0.61–1.24)
GFR, Estimated: 33 mL/min — ABNORMAL LOW (ref >60)
Glucose, Bld: 110 mg/dL — ABNORMAL HIGH (ref 70–99)
Potassium: 3.6 mmol/L (ref 3.5–5.1)
Sodium: 142 mmol/L (ref 135–145)

## 2024-07-02 LAB — AMMONIA: Ammonia: 28 umol/L (ref 9–35)

## 2024-07-02 LAB — CBG MONITORING, ED: Glucose-Capillary: 99 mg/dL (ref 70–99)

## 2024-07-02 LAB — PROTIME-INR
INR: 2 — ABNORMAL HIGH (ref 0.8–1.2)
Prothrombin Time: 23.4 s — ABNORMAL HIGH (ref 11.4–15.2)

## 2024-07-02 LAB — I-STAT CG4 LACTIC ACID, ED
Lactic Acid, Venous: 1.2 mmol/L (ref 0.5–1.9)
Lactic Acid, Venous: 1.5 mmol/L (ref 0.5–1.9)

## 2024-07-02 LAB — ETHANOL: Alcohol, Ethyl (B): <15 mg/dL (ref <15)

## 2024-07-02 MED ORDER — ACETAMINOPHEN 325 MG PO TABS
650.0000 mg | ORAL_TABLET | ORAL | Status: DC | PRN
Start: 2024-07-02 — End: 2024-07-04

## 2024-07-02 MED ORDER — WARFARIN SODIUM 2 MG PO TABS
2.0000 mg | ORAL_TABLET | Freq: Every day | ORAL | Status: DC
Start: 2024-07-02 — End: 2024-07-02

## 2024-07-02 MED ORDER — MAGNESIUM OXIDE -MG SUPPLEMENT 400 (240 MG) MG PO TABS
400.0000 mg | ORAL_TABLET | Freq: Every day | ORAL | Status: DC
  Administered 2024-07-02 – 2024-07-04 (×3): 400 mg via ORAL
  Filled 2024-07-02 (×3): qty 1

## 2024-07-02 MED ORDER — SILDENAFIL CITRATE 20 MG PO TABS
20.0000 mg | ORAL_TABLET | Freq: Three times a day (TID) | ORAL | Status: DC
  Administered 2024-07-02 – 2024-07-04 (×5): 20 mg via ORAL
  Filled 2024-07-02 (×8): qty 1

## 2024-07-02 MED ORDER — ORAL CARE MOUTH RINSE: 15.0000 mL | OROMUCOSAL | Status: DC | PRN

## 2024-07-02 MED ORDER — ROSUVASTATIN CALCIUM 5 MG PO TABS
10.0000 mg | ORAL_TABLET | Freq: Every day | ORAL | Status: DC
  Administered 2024-07-02 – 2024-07-03 (×2): 10 mg via ORAL
  Filled 2024-07-02 (×2): qty 2

## 2024-07-02 MED ORDER — ONDANSETRON HCL 4 MG/2ML IJ SOLN: 4.0000 mg | Freq: Four times a day (QID) | INTRAMUSCULAR | Status: DC | PRN

## 2024-07-02 MED ORDER — PANTOPRAZOLE SODIUM 40 MG PO TBEC
40.0000 mg | DELAYED_RELEASE_TABLET | Freq: Every day | ORAL | Status: DC
  Administered 2024-07-02 – 2024-07-04 (×3): 40 mg via ORAL
  Filled 2024-07-02 (×3): qty 1

## 2024-07-02 MED ORDER — WARFARIN - PHARMACIST DOSING INPATIENT: Freq: Every day | Status: DC

## 2024-07-02 MED ORDER — ALLOPURINOL 100 MG PO TABS
50.0000 mg | ORAL_TABLET | Freq: Every day | ORAL | Status: DC
Start: 2024-07-02 — End: 2024-07-02

## 2024-07-02 MED ORDER — ALLOPURINOL 100 MG PO TABS
50.0000 mg | ORAL_TABLET | Freq: Every day | ORAL | Status: DC
  Administered 2024-07-02 – 2024-07-04 (×3): 50 mg via ORAL
  Filled 2024-07-02 (×3): qty 1

## 2024-07-02 MED ORDER — ACETAMINOPHEN 325 MG PO TABS: 650.0000 mg | ORAL_TABLET | ORAL | Status: DC | PRN

## 2024-07-02 MED ORDER — PIPERACILLIN-TAZOBACTAM 3.375 G IVPB
3.3750 g | Freq: Three times a day (TID) | INTRAVENOUS | Status: DC
  Administered 2024-07-02 – 2024-07-04 (×6): 3.375 g via INTRAVENOUS
  Filled 2024-07-02 (×8): qty 50

## 2024-07-02 MED ORDER — LACTATED RINGERS IV BOLUS
1000.0000 mL | Freq: Once | INTRAVENOUS | Status: AC
  Administered 2024-07-02: 1000 mL via INTRAVENOUS

## 2024-07-02 MED ORDER — WARFARIN SODIUM 2 MG PO TABS
2.0000 mg | ORAL_TABLET | Freq: Once | ORAL | Status: AC
  Administered 2024-07-02: 2 mg via ORAL
  Filled 2024-07-02: qty 1

## 2024-07-02 MED ORDER — SODIUM CHLORIDE 0.9 % IV SOLN
2.0000 g | INTRAVENOUS | Status: DC
  Administered 2024-07-02: 2 g via INTRAVENOUS
  Filled 2024-07-02: qty 20

## 2024-07-02 NOTE — Progress Notes (Addendum)
 PHARMACY - PHYSICIAN COMMUNICATION CRITICAL VALUE ALERT - BLOOD CULTURE IDENTIFICATION (BCID)  Isaac [name] is an 73 y.o. male who presented to [facility] on 07/02/2024 with a chief complaint of AMS  Assessment:  73yoM was seen in ED yesterday for suspected UTI, discharged after receiving IV Rocephin  and was given Cipro  Rx  Name of physician (or Provider) Contacted: Caron Salt, DO  Current antibiotics: n/a  Changes to prescribed antibiotics recommended:  Start Ceftriaxone  2g every 24 hours Recommendations accepted by provider  Results for orders placed or performed during the hospital encounter of 07/01/24  Blood Culture ID Panel (Reflexed) (Collected: 07/01/2024  6:56 PM)  Result Value Ref Range   Enterococcus faecalis NOT DETECTED NOT DETECTED   Enterococcus Faecium NOT DETECTED NOT DETECTED   Listeria monocytogenes NOT DETECTED NOT DETECTED   Staphylococcus species NOT DETECTED NOT DETECTED   Staphylococcus aureus (BCID) NOT DETECTED NOT DETECTED   Staphylococcus epidermidis NOT DETECTED NOT DETECTED   Staphylococcus lugdunensis NOT DETECTED NOT DETECTED   Streptococcus species NOT DETECTED NOT DETECTED   Streptococcus agalactiae NOT DETECTED NOT DETECTED   Streptococcus pneumoniae NOT DETECTED NOT DETECTED   Streptococcus pyogenes NOT DETECTED NOT DETECTED   A.calcoaceticus-baumannii NOT DETECTED NOT DETECTED   Bacteroides fragilis NOT DETECTED NOT DETECTED   Enterobacterales DETECTED (A) NOT DETECTED   Enterobacter cloacae complex NOT DETECTED NOT DETECTED   Escherichia coli DETECTED (A) NOT DETECTED   Klebsiella aerogenes NOT DETECTED NOT DETECTED   Klebsiella oxytoca NOT DETECTED NOT DETECTED   Klebsiella pneumoniae NOT DETECTED NOT DETECTED   Proteus species NOT DETECTED NOT DETECTED   Salmonella species NOT DETECTED NOT DETECTED   Serratia marcescens NOT DETECTED NOT DETECTED   Haemophilus influenzae NOT DETECTED NOT DETECTED   Neisseria meningitidis NOT  DETECTED NOT DETECTED   Pseudomonas aeruginosa NOT DETECTED NOT DETECTED   Stenotrophomonas maltophilia NOT DETECTED NOT DETECTED   Candida albicans NOT DETECTED NOT DETECTED   Candida auris NOT DETECTED NOT DETECTED   Candida glabrata NOT DETECTED NOT DETECTED   Candida krusei NOT DETECTED NOT DETECTED   Candida parapsilosis NOT DETECTED NOT DETECTED   Candida tropicalis NOT DETECTED NOT DETECTED   Cryptococcus neoformans/gattii NOT DETECTED NOT DETECTED   CTX-M ESBL NOT DETECTED NOT DETECTED   Carbapenem resistance IMP NOT DETECTED NOT DETECTED   Carbapenem resistance KPC NOT DETECTED NOT DETECTED   Carbapenem resistance NDM NOT DETECTED NOT DETECTED   Carbapenem resist OXA 48 LIKE NOT DETECTED NOT DETECTED   Carbapenem resistance VIM NOT DETECTED NOT DETECTED    Dionicia Canavan, PharmD, RPh PGY1 Acute Care Pharmacy Resident [facility]  07/02/2024 9:38 AM

## 2024-07-02 NOTE — H&P (Signed)
 Advanced Heart Failure Team History and Physical Note   PCP:  Stanton Lynwood FALCON, MD  PCP-Cardiology: Ezra Shuck, MD    Reason for Admission: GIB HPI:    73 y.o. with history of CAD, HTN, atrial fibrillation, and chronic systolic CHF s/p LVAD HM3.   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   He is accompanied by his wife today. They were in the emergency department earlier yesterday afternoon for similar symptoms.  The past few days he has had anorexia, nausea, as well as dysuria.  Has found it very difficult to keep food down and has not been drinking as much liquid as he was previously.  He notes there has been some burning when he pees as well as some occasional diarrhea and vomiting.  When he came to the emergency room yesterday, urinalysis was consistent with urinary tract infection, he was given a dose of ceftriaxone  and discharged with a course of ciprofloxacin .  He returned given persistence of his symptoms.  Blood cultures returned positive for E. coli.  Patient will be admitted for further management.  Objective:    Vital Signs:   Temp:  [98 F (36.7 C)-98.1 F (36.7 C)] 98.1 F (36.7 C) (10/12 1151) Pulse Rate:  [62-65] 62 (10/12 0845) Resp:  [20-21] 21 (10/12 0845) BP: (95-96)/(66-73) 96/73 (10/12 0845) SpO2:  [99 %-100 %] 100 % (10/12 0845) Weight:  [84 kg] 84 kg (10/12 0744)   Filed Weights   07/02/24 0744  Weight: 84 kg     Physical Exam     General: Fair appearing. No distress on RA Cardiac: JVP flat, mechanical LVAD hum Driveline: Dressing C/D/I. No drainage or redness. Anchor in place. Extremities: Warm and dry. No peripheral edema. Neuro: Alert and oriented x3. Affect pleasant.  LVAD Interrogation HM 3: Speed: 5300 Flow: 4.5 PI: 3.2 Power: 4. Few PI events  Labs    Basic Metabolic Panel: Recent Labs  Lab 07/01/24 1856 07/02/24 0752  NA 136 142  K 3.7 3.6  CL 104 104  CO2 21* 21*  GLUCOSE 95  110*  BUN 27* 31*  CREATININE 2.02* 2.07*  CALCIUM  9.0 8.8*   CBC: Recent Labs  Lab 07/01/24 1856 07/02/24 0752  WBC 12.6* 11.0*  NEUTROABS 10.7* 9.1*  HGB 10.9* 10.4*  HCT 35.5* 33.9*  MCV 82.9 82.1  PLT 214 211   BNP (last 3 results) Recent Labs    02/05/24 1106  BNP 343.8*   Coagulation Studies: Recent Labs    06/30/24 0000  INR 1.9*   Patient Profile   73 y.o. with history of CAD, HTN, atrial fibrillation, and chronic systolic CHF s/p LVAD HM3. Presenting with GIB.   Assessment/Plan   Bacteremia: Suspect urinary source given symptoms and U/A. Initial results with E. Coli, given a dose of CTX but will broaden to zosyn given history of citrobacter resistant to CTX. - IV zosyn ordered - Renal function stable, renal ultrasound ordered - Gentle IVF - Repeat cultures in 48 hours - Admit for management  Chronic systolic CHF: s/p HM III VAD on 09/17/22. LVAD parameters reviewed and stable today, no alarms or PI events on current speed.  - Hold diuretics given poor PO intake - Continue sildenafil , MAPs high enough to tolerated - Continue warfarin. INR 2, goal 2-2.5 with recent bleed. Discussed with pharmacy  Tricuspid regurgitation:  Tricuspid repair not done at time of VAD due to proximity of ICD wires and hypotension during surgery. He has  severe TR.   Atrial fibrillation: Paroxysmal.  He is off amiodarone  with resolution of nausea. Currently in NSR - Continue warfarin  CKD stage IV: Creatinine stable at baseline at 2.07.  - Hold Lasix , gentle IVF as above.   CAD: History of PCI to OM1 in 2007 and RCA in 2013.  No CP or ACs.  - Deferred cardiac cath in 12/22 due to AKI and plan for VAD - Continue Crestor .    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  INR goal increased to 2.5-3 but reduced with recent GI bleed. - hold warfarin as above.  Morene JINNY Brownie 07/02/2024  Advanced Heart Failure Team Pager [phone] (M-F; 7a - 5p)  Please contact [facility] for night-coverage after hours (4p -7a ) and weekends on [email or web address]

## 2024-07-02 NOTE — Progress Notes (Signed)
 ANTICOAGULATION CONSULT NOTE  Pharmacy Consult for Warfarin Indication: LVAD  Allergies  Allergen Reactions   Neosporin [Neomycin-Bacitracin Zn-Polymyx] Hives   Doxycycline  Nausea And Vomiting   Mushroom Extract Complex (Obsolete) Nausea And Vomiting   Tape Other (See Comments)    Some tapes/dressings can irritate the skin    Patient Measurements: Height: 6' (182.9 cm) Weight: 84 kg (185 lb 3 oz) IBW/kg (Calculated) : 77.6  Vital Signs: Temp: 98.1 F (36.7 C) (10/12 1151) Temp Source: Oral (10/12 1151) BP: 96/73 (10/12 0845) Pulse Rate: 62 (10/12 0845)  Labs: Recent Labs    06/30/24 0000 07/01/24 1856 07/01/24 2055 07/02/24 0752 07/02/24 1325  HGB  --  10.9*  --  10.4*  --   HCT  --  35.5*  --  33.9*  --   PLT  --  214  --  211  --   LABPROT  --   --   --   --  23.4*  INR 1.9*  --   --   --  2.0*  CREATININE  --  2.02*  --  2.07*  --   CKTOTAL  --  27*  --   --   --   TROPONINIHS  --  58* 61*  --   --     Estimated Creatinine Clearance: 35.4 mL/min (A) (by C-G formula based on SCr of 2.07 mg/dL (H)).   Medical History: Past Medical History:  Diagnosis Date   Arrhythmia    atrial fibrillation   CHF (congestive heart failure) (HCC)    Chronic kidney disease    Coronary artery disease    Hyperlipidemia    Hypertension    Myocardial infarct (HCC)     Medications:  (Not in a hospital admission)  Scheduled:   allopurinol   50 mg Oral Daily   magnesium  oxide  400 mg Oral Daily   pantoprazole   40 mg Oral Daily   rosuvastatin   10 mg Oral QHS   sildenafil   20 mg Oral TID   Warfarin - Pharmacist Dosing Inpatient   Does not apply q1600   Infusions:   piperacillin-tazobactam (ZOSYN)  IV 3.375 g (07/02/24 1321)   PRN: acetaminophen , acetaminophen , ondansetron  (ZOFRAN ) IV  Assessment: 73 yom with a history of CAD, HTN, AF, HF s/p LVAD HM3. Patient is presenting with weakness. Warfarin per pharmacy consult placed for LVAD.  Warfarin dose per last AC visit  4 mg (2 mg x 2) every Mon, Wed, Fri; 2 mg (2 mg x 1) all other days but per med rec pt reports has been taking 2mg  daily - LD 10/11 per med rec. (INR 2-2.5) - was 1.9 on 10/10  PT / INR today is 23.4 / 2, which is therapeutic Hgb 10.4; plt 211  Goal of Therapy:  INR Goal 2-2.5 Monitor platelets by anticoagulation protocol: Yes   Plan:  Give 2mg  warfarin today - repeat per INR Monitor for s/s of hemorrhage, daily INR, CBC Watch for new DDIs  Dorn Buttner, PharmD, BCPS 07/02/2024 2:04 PM ED Clinical Pharmacist -  [phone]

## 2024-07-02 NOTE — ED Triage Notes (Signed)
 PT BIB [facility] from home after wife noticed alteration to mental status this morning along with generalized weakness. Pt wife states he has not been eating well past three days. Not oriented to time with EMS, Aox3. Denies pain. Discharged last night from ED with pending urine cultures. Episode of emesis last night. PT has an LVAD that is seen at [facility]. EMS VS: 99% O2 RA, 24RR, 105/68BP 98.9 T CBG 111

## 2024-07-02 NOTE — ED Provider Notes (Signed)
 [place] EMERGENCY DEPARTMENT AT [facility] Provider Note   CSN: [id number] Arrival date & time: 07/02/24  9260     Patient presents with: Weakness   Isaac [name] is a 73 y.o. male who presents to the ED via EMS after increased generalized weakness and confusion was noted by his spouse this morning.  Treated in the ED yesterday for weakness and diagnosed with UTI, sent home with oral ciprofloxacin .  Did have 1 episode of emesis last night otherwise states that he has been able to tolerate oral intake though has not had any thing to eat that as of this morning.  It is also noted this patient does have a VAD for chronic systolic heart failure.  Further medical history shows stage III CKD, atrial fibrillation, hyperlipidemia.    Weakness      Prior to Admission medications   Medication Sig Start Date End Date Taking? Authorizing Provider  acetaminophen  (TYLENOL ) 325 MG tablet Take 2 tablets (650 mg total) by mouth every 4 (four) hours as needed for headache or mild pain (pain score 1-3). Patient not taking: Reported on 05/30/2024 02/18/24   Angiulli, Toribio PARAS, PA-C  allopurinol  (ZYLOPRIM ) 100 MG tablet Take 0.5 tablets (50 mg total) by mouth daily. 06/16/24   Rolan Ezra RAMAN, MD  ciprofloxacin  (CIPRO ) 500 MG tablet Take 1 tablet (500 mg total) by mouth every 12 (twelve) hours for 7 days. 07/01/24 07/08/24  Zackowski, Scott, MD  furosemide  (LASIX ) 20 MG tablet Take 3 tablets (60 mg total) by mouth daily. 05/30/24   Rolan Ezra RAMAN, MD  magnesium  oxide (MAG-OX) 400 (240 Mg) MG tablet Take 1 tablet (400 mg total) by mouth daily. 02/18/24   Angiulli, Toribio PARAS, PA-C  Multiple Vitamins-Minerals (ONE A DAY MEN 50 PLUS PO) Take 1 tablet by mouth daily.    [provider]  pantoprazole  (PROTONIX ) 40 MG tablet Take twice a day for 1 month and then can decrease back to once daily. 04/02/24   Goodrich, Callie E, PA-C  potassium chloride  SA (KLOR-CON  M) 10 MEQ tablet Take 2 tablets  (20 mEq total) by mouth daily. 02/28/24   Rolan Ezra RAMAN, MD  rosuvastatin  (CRESTOR ) 10 MG tablet Take 1 tablet (10 mg total) by mouth at bedtime. 02/18/24   Angiulli, Toribio PARAS, PA-C  sildenafil  (REVATIO ) 20 MG tablet Take 1 tablet (20 mg total) by mouth 3 (three) times daily. 02/18/24   Angiulli, Toribio PARAS, PA-C  warfarin (COUMADIN ) 2 MG tablet Take 2 mg by mouth every Tuesday, Thursday, and Saturday and 4 mg all other days 03/10/24   Rolan Ezra RAMAN, MD    Allergies: Neosporin [neomycin-bacitracin zn-polymyx], Doxycycline , Mushroom extract complex (obsolete), and Tape    Review of Systems  Neurological:  Positive for weakness.  Psychiatric/Behavioral:  Positive for confusion.   All other systems reviewed and are negative.   Updated Vital Signs BP 96/73   Pulse 62   Temp 98 F (36.7 C) (Oral)   Resp (!) 21   Ht 6' (1.829 m)   Wt 84 kg   SpO2 100%   BMI 25.12 kg/m   Physical Exam Vitals and nursing note reviewed.  Constitutional:      General: He is awake. He is not in acute distress.    Appearance: Normal appearance.  HENT:     Head: Normocephalic and atraumatic.     Mouth/Throat:     Mouth: Mucous membranes are moist.     Pharynx: Oropharynx is clear. Uvula  midline.  Eyes:     Extraocular Movements: Extraocular movements intact.     Conjunctiva/sclera: Conjunctivae normal.     Pupils: Pupils are equal, round, and reactive to light.  Neck:     Trachea: Trachea and phonation normal.  Cardiovascular:     Rate and Rhythm: Normal rate and regular rhythm.     Pulses: Normal pulses.     Heart sounds: No murmur heard.    No friction rub. No gallop.     Comments: VAD in place Pulmonary:     Effort: Pulmonary effort is normal.     Breath sounds: Normal breath sounds.  Abdominal:     General: Abdomen is flat. Bowel sounds are normal.     Palpations: Abdomen is soft.  Musculoskeletal:        General: Normal range of motion.     Cervical back: Normal range of motion and neck  supple.     Right lower leg: No edema.     Left lower leg: No edema.  Skin:    General: Skin is warm and dry.     Capillary Refill: Capillary refill takes less than 2 seconds.  Neurological:     General: No focal deficit present.     Mental Status: He is alert.     GCS: GCS eye subscore is 4. GCS verbal subscore is 5. GCS motor subscore is 6.     Cranial Nerves: Cranial nerves 2-12 are intact.     Sensory: Sensation is intact.     Motor: Motor function is intact.     Coordination: Coordination is intact.     Comments: He is oriented to person and place however is not oriented to time or event.  Psychiatric:        Mood and Affect: Mood normal.        Behavior: Behavior is cooperative.     (all labs ordered are listed, but only abnormal results are displayed) Labs Reviewed  BASIC METABOLIC PANEL WITH GFR - Abnormal; Notable for the following components:      Result Value   CO2 21 (*)    Glucose, Bld 110 (*)    BUN 31 (*)    Creatinine, Ser 2.07 (*)    Calcium  8.8 (*)    GFR, Estimated 33 (*)    Anion gap 17 (*)    All other components within normal limits  CBC WITH DIFFERENTIAL/PLATELET - Abnormal; Notable for the following components:   WBC 11.0 (*)    RBC 4.13 (*)    Hemoglobin 10.4 (*)    HCT 33.9 (*)    MCH 25.2 (*)    RDW 18.8 (*)    Neutro Abs 9.1 (*)    Lymphs Abs 0.5 (*)    Monocytes Absolute 1.1 (*)    All other components within normal limits  URINALYSIS, ROUTINE W REFLEX MICROSCOPIC  ETHANOL  AMMONIA  I-STAT CG4 LACTIC ACID, ED  CBG MONITORING, ED  I-STAT CG4 LACTIC ACID, ED    EKG: EKG Interpretation Date/Time:  Sunday July 02 2024 08:52:41 EDT Ventricular Rate:  61 PR Interval:  269 QRS Duration:  110 QT Interval:  500 QTC Calculation: 504 R Axis:   -64  Text Interpretation: Sinus or ectopic atrial rhythm Multiform ventricular premature complexes Prolonged PR interval Inferior infarct, acute (LCx) Anterior infarct, old Prolonged QT interval  Artifact in lead(s) I II III aVR aVL aVF V1 V2 V4 V5 V6 Confirmed by Neysa Clap [phone]) on 07/02/2024 9:55:52  AM  Radiology: DG CHEST PORT 1 VIEW Result Date: 07/01/2024 CLINICAL DATA:  Chest pain and fever.  History of LVAD. EXAM: PORTABLE CHEST 1 VIEW COMPARISON:  03/21/2024. FINDINGS: The heart is enlarged and an LVAD device is noted and appears stable in position. Sternotomy wires are present over the midline. There is a multi lead pacemaker device over the left chest. The mediastinal contour stable. Atherosclerotic calcification of the aorta is noted. No consolidation, effusion, or pneumothorax. No acute osseous abnormality. Surgical clips are noted in the subclavian region on the right. IMPRESSION: Stable chest with no active disease. Electronically Signed   By: Leita Birmingham M.D.   On: 07/01/2024 18:00     Procedures   Medications Ordered in the ED  cefTRIAXone  (ROCEPHIN ) 2 g in sodium chloride  0.9 % 100 mL IVPB (2 g Intravenous New Bag/Given 07/02/24 1007)  lactated ringers  bolus 1,000 mL (has no administration in time range)    Clinical Course as of 07/02/24 1027  Sun Jul 02, 2024  9061 Informed by pharmacy : TERRE for this pt 1/4 GNR E.coli, no resistance mechanisms detected.' Also with UTI yesterday. Will start antibiotics.  [TY]    Clinical Course User Index [TY] Neysa Caron PARAS, DO                                 Medical Decision Making Amount and/or Complexity of Data Reviewed Labs: ordered. Radiology: ordered. ECG/medicine tests: ordered.   Medical Decision Making:   Isaac [name] is a 73 y.o. male who presented to the ED today with increasing confusion and weakness detailed above.    Additional history discussed with patient's family/caregivers.  External chart has been reviewed including previous labs, imaging, previous admission records. Patient placed on continuous vitals and telemetry monitoring while in ED which was reviewed periodically.  Complete  initial physical exam performed, notably the patient  was alert and oriented only to person and place but not to time and event.  No apparent distress.  VAT is in place and operating normally, otherwise normal physical exam..    Reviewed and confirmed nursing documentation for past medical history, family history, social history.    Initial Assessment:   With the patient's presentation of confusion and weakness, consider likely developing UTI, uremia.   Initial Plan:  Connect with Screening labs including CBC and Metabolic panel to evaluate for infectious or metabolic etiology of disease.  Urinalysis with reflex culture ordered to evaluate for UTI or relevant urologic/nephrologic pathology.  Obtain CT head secondary to altered level of consciousness. EKG to evaluate for cardiac pathology Objective evaluation as below reviewed   Initial Study Results:   Laboratory  All laboratory results reviewed without evidence of clinically relevant pathology.   Exceptions include: Creatinine is 2.07 with GFR of 33, leukocytosis is 11.0, hemoglobin 10.4.  These are all stable to his visit yesterday.  EKG EKG was reviewed independently. Rate, rhythm, axis, intervals all examined and without medically relevant abnormality. ST segments without concerns for elevations.    Radiology:  All images reviewed independently. Agree with radiology report at this time.   DG CHEST PORT 1 VIEW Result Date: 07/01/2024 CLINICAL DATA:  Chest pain and fever.  History of LVAD. EXAM: PORTABLE CHEST 1 VIEW COMPARISON:  03/21/2024. FINDINGS: The heart is enlarged and an LVAD device is noted and appears stable in position. Sternotomy wires are present over the midline. There is a multi  lead pacemaker device over the left chest. The mediastinal contour stable. Atherosclerotic calcification of the aorta is noted. No consolidation, effusion, or pneumothorax. No acute osseous abnormality. Surgical clips are noted in the subclavian  region on the right. IMPRESSION: Stable chest with no active disease. Electronically Signed   By: Leita Birmingham M.D.   On: 07/01/2024 18:00     Reassessment and Plan:   Physical exam for this patient is concerning for decreased mental status in the setting of previously diagnosed UTI.  Lab review is stable, no elevations in creatinine or leukocytosis since initial evaluation yesterday.  Pending CT head for altered mental status however on reevaluation of the patient he has resumed to his baseline, with being oriented to person, place, time, and event.  Secondary to urine cultures, IV ceftriaxone  was started on this patient as noted, he was evaluated by the LVAD team who will be admitting the patient to [facility].  Further documentation this is available in the LVAD team consultation note.  This was discussed with the patient, and his spouse, and they understand and agree with necessity for inpatient admission for continued IV antibiotics and IV hydration.       Final diagnoses:  Acute cystitis without hematuria  Disorientation  LVAD (left ventricular assist device) present [facility])    ED Discharge Orders     None          Myriam Dorn BROCKS, PA 07/02/24 1027    Neysa Caron PARAS, DO 07/02/24 1052

## 2024-07-02 NOTE — ED Notes (Signed)
 PT family states they are pescatarian, prefers no non fish meats with meal tray

## 2024-07-02 NOTE — Progress Notes (Addendum)
 LVAD Coordinator ED Encounter  Isaac [name] a 73 y.o. male that presented to [facility] ER today due to altered mental status. He has a past medical history  has a past medical history of Arrhythmia, CHF (congestive heart failure) (HCC), Chronic kidney disease, Coronary artery disease, Hyperlipidemia, Hypertension, and Myocardial infarct (HCC).  LVAD is a HM 3 and was implanted on 09/17/21 by Dr Lucas for destination therapy.   Pt was seen in ER yesterday. Found to have UTI on UA 07/01/24. Discharged home after receiving IV Rocephin , with Cipro  prescription. Urine culture pending.   Pt returned to ER this morning due to altered mental status. Pt was alert and oriented yesterday. Wife reports this morning pt did not know where he was, or what was going on. States mentation has cleared some since arrival to ER. Reports episode of orange vomit overnight. States pt has not eaten or drank anything in 2 days.   Blood cultures 07/01/24: + Enterobacterales & E-coli  Head CT 07/02/24:  1. Generalized cerebral atrophy with chronic left cerebellar, left occipital lobe and right frontal parietal chronic infarcts. 2. No acute intracranial abnormality. 3. Mild to moderate severity bilateral ethmoid sinus disease.  Vital signs: Temp: 98.0 HR: 65 Doppler MAP:  Automated BP: 95/66 (77) O2 Sat: 99%  LVAD interrogation reveals:  Speed: 5300 Flow: 4.7 Power: 3.8 w PI: 3.1  Alarms: none Events: rare  Drive Line: Managed weekly by pt's wife.   Significant Events with LVAD: - Admitted in 3/24 with left groin abscess. This spontaneously drained and was treated with IV antibiotics. He was sent home on linezolid  and Augmentin . He developed profuse diarrhea that he attributed to linezolid . Linezolid  was stopped and he was continued on Augmentin .  - Admitted with dysarthria and right-sided weakness in 5/25.  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 Echo this admission showed EF  20-25%, mild LVH, LVAD present, mild RV dysfunction, moderate AI, mild RV dysfunction with normal RV size, severe TR.  INR goal increased to 2.5-3. Dual chamber ICD generator was also changed this admission, needs pacing at times with bradycardia.   - Admitted 03/21/24 with GI bleeding/melena, he received a total of 7 units PRBCs.  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  INR goal was decreased to 2-2.5.    Updated VAD Providers (Dr Zenaida) about the above. No LVAD issues and pump is functioning as expected. Able to independently manage LVAD equipment. No LVAD needs at this time.    Plan:  Admit to 2C per Dr Zenaida Isaiah Knoll RN VAD Coordinator  Office: [phone]  24/7 Pager: [phone]

## 2024-07-02 NOTE — ED Notes (Signed)
 LVAD Coordinator paged for PA Gilliam.

## 2024-07-03 ENCOUNTER — Inpatient Hospital Stay (HOSPITAL_COMMUNITY)

## 2024-07-03 DIAGNOSIS — Z95811 Presence of heart assist device: Secondary | ICD-10-CM | POA: Diagnosis not present

## 2024-07-03 DIAGNOSIS — I5022 Chronic systolic (congestive) heart failure: Secondary | ICD-10-CM | POA: Diagnosis not present

## 2024-07-03 LAB — BASIC METABOLIC PANEL WITH GFR
Anion gap: 9 (ref 5–15)
BUN: 31 mg/dL — ABNORMAL HIGH (ref 8–23)
CO2: 23 mmol/L (ref 22–32)
Calcium: 8.6 mg/dL — ABNORMAL LOW (ref 8.9–10.3)
Chloride: 103 mmol/L (ref 98–111)
Creatinine, Ser: 2.01 mg/dL — ABNORMAL HIGH (ref 0.61–1.24)
GFR, Estimated: 35 mL/min — ABNORMAL LOW (ref >60)
Glucose, Bld: 105 mg/dL — ABNORMAL HIGH (ref 70–99)
Potassium: 3.5 mmol/L (ref 3.5–5.1)
Sodium: 135 mmol/L (ref 135–145)

## 2024-07-03 LAB — PROTIME-INR
INR: 2.2 — ABNORMAL HIGH (ref 0.8–1.2)
Prothrombin Time: 25.6 s — ABNORMAL HIGH (ref 11.4–15.2)

## 2024-07-03 LAB — CBC
HCT: 32.5 % — ABNORMAL LOW (ref 39.0–52.0)
Hemoglobin: 10.4 g/dL — ABNORMAL LOW (ref 13.0–17.0)
MCH: 25.5 pg — ABNORMAL LOW (ref 26.0–34.0)
MCHC: 32 g/dL (ref 30.0–36.0)
MCV: 79.7 fL — ABNORMAL LOW (ref 80.0–100.0)
Platelets: 201 K/uL (ref 150–400)
RBC: 4.08 MIL/uL — ABNORMAL LOW (ref 4.22–5.81)
RDW: 18.6 % — ABNORMAL HIGH (ref 11.5–15.5)
WBC: 6.3 K/uL (ref 4.0–10.5)
nRBC: 0 % (ref 0.0–0.2)

## 2024-07-03 LAB — URINE CULTURE: Culture: >=100000 — AB

## 2024-07-03 LAB — MRSA NEXT GEN BY PCR, NASAL: MRSA by PCR Next Gen: NOT DETECTED

## 2024-07-03 LAB — LACTATE DEHYDROGENASE: LDH: 126 U/L (ref 98–192)

## 2024-07-03 MED ORDER — FUROSEMIDE 40 MG PO TABS
60.0000 mg | ORAL_TABLET | Freq: Every day | ORAL | Status: DC
  Administered 2024-07-04: 60 mg via ORAL
  Filled 2024-07-03: qty 1

## 2024-07-03 MED ORDER — WARFARIN SODIUM 2 MG PO TABS
2.0000 mg | ORAL_TABLET | Freq: Once | ORAL | Status: AC
  Administered 2024-07-03: 2 mg via ORAL
  Filled 2024-07-03: qty 1

## 2024-07-03 MED ORDER — POTASSIUM CHLORIDE CRYS ER 20 MEQ PO TBCR
40.0000 meq | EXTENDED_RELEASE_TABLET | Freq: Once | ORAL | Status: AC
  Administered 2024-07-03: 40 meq via ORAL
  Filled 2024-07-03: qty 2

## 2024-07-03 NOTE — Discharge Summary (Incomplete)
 Advanced Heart Failure Team  Discharge Summary   Patient ID: Isaac [name] MRN: [id number], DOB/AGE: Jan 30, 1951 73 y.o. Admit date: 07/02/2024 D/C date:     07/04/2024   Primary Discharge Diagnoses:  E coli UTI/Bacteremia   Secondary Discharge Diagnoses:  Chronic Systolic Heart Failure, w/ HMIII LVAD Polypoid lesion in bladder (seen on Renal US )  Stage IV CKD PAF CAD w/o Angina H/o CVA Chronic Anticoagulation Therapy w/ Coumadin    Hospital Course:    73 y.o. with history of CAD, HTN, atrial fibrillation, and chronic systolic CHF s/p LVAD HM3.   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    Pt initially presented to the ED on 10/11 w/ complaints of anorexia, nausea, as well as dysuria, ongoing x several days.  Has found it very difficult to keep food down and has not been drinking as much liquid as he was previously.  He notes there has been some burning when he pees as well as some occasional diarrhea and vomiting. Urinalysis was consistent with urinary tract infection, he was given a dose of ceftriaxone  and discharged with a course of ciprofloxacin .   He returned on 10/12 given persistence of his symptoms.  Blood cultures returned positive for E. coli.  Patient was admitted for further management. Placed on Zosyn. Lasix  held on admit and he was given gentle IVF hydration. Renal US : No hydronephrosis, +polypoid lesion along the mucosal surface of the posterior bladder wall, mucosal neoplasm not excluded. Urinary symptoms improved w/ abx. WBC improved 11K on admit>>6.0. VAD parameters remained stable.   Repeat BCx were obtain 10/14. Plan for cefadroxil  for 10 days at discharge.   Pt last seen and examined by Dr. Rolan and felt stable for d/c home.   After discharge, pt will have followup with urology as outpatient given recurrent UTI in male, also with polypoid lesion in bladder and likely need for cystoscopy.   LVAD Interrogation HM III:   Flow 4.6 liters/min, speed 5350, power 3.9, PI 3.3    Discharge Weight Range: 77.5 kg Discharge Vitals: Blood pressure 97/75, pulse 73, temperature 97.8 F (36.6 C), temperature source Oral, resp. rate 17, height 6' (1.829 m), weight 77.9 kg, SpO2 96%.  Labs: Lab Results  Component Value Date   WBC 4.0 07/04/2024   HGB 9.8 (L) 07/04/2024   HCT 30.8 (L) 07/04/2024   MCV 79.4 (L) 07/04/2024   PLT 187 07/04/2024    Recent Labs  Lab 07/01/24 1856 07/02/24 0752 07/04/24 0400  NA 136   < > 139  K 3.7   < > 3.9  CL 104   < > 106  CO2 21*   < > 23  BUN 27*   < > 29*  CREATININE 2.02*   < > 2.20*  CALCIUM  9.0   < > 8.6*  PROT 5.7*  --   --   BILITOT 2.7*  --   --   ALKPHOS 129*  --   --   ALT 21  --   --   AST 25  --   --   GLUCOSE 95   < > 102*   < > = values in this interval not displayed.   Lab Results  Component Value Date   CHOL 118 02/06/2024   HDL 39 (L) 02/06/2024   LDLCALC 73 02/06/2024   TRIG 32 02/06/2024   BNP (last 3 results) Recent Labs    02/05/24 1106  BNP 343.8*    ProBNP (last 3  results) No results for input(s): PROBNP in the last 8760 hours.   Diagnostic Studies/Procedures   US  RENAL Result Date: 07/03/2024 CLINICAL DATA:  Urinary tract infection. EXAM: RENAL / URINARY TRACT ULTRASOUND COMPLETE COMPARISON:  CT scan 03/21/2024. FINDINGS: Right Kidney: Renal measurements: 9.5 x 6.2 x 6.4 cm = volume: 197 mL. Echogenicity within normal limits. No mass or hydronephrosis visualized. Left Kidney: Renal measurements: 9.9 x 5.6 x 5.4 cm = volume: 160 mL. Echogenicity within normal limits. No mass or hydronephrosis visualized. Bladder: Polypoid lesion identified along the mucosal surface of the posterior bladder wall. While no definite color flow can be demonstrated within this structure on Doppler assessment, mucosal neoplasm is not excluded. Other: None. IMPRESSION: 1. No evidence for hydronephrosis. 2. Polypoid lesion along the mucosal surface of the  posterior bladder wall. While no definite color flow can be demonstrated within this structure on Doppler assessment, mucosal neoplasm is not excluded. Hematuria protocol CT with and without contrast or cystoscopy recommended to further evaluate. Electronically Signed   By: Camellia Candle M.D.   On: 07/03/2024 06:44   CT Head Wo Contrast Result Date: 07/02/2024 CLINICAL DATA:  Altered mental status. EXAM: CT HEAD WITHOUT CONTRAST TECHNIQUE: Contiguous axial images were obtained from the base of the skull through the vertex without intravenous contrast. RADIATION DOSE REDUCTION: This exam was performed according to the departmental dose-optimization program which includes automated exposure control, adjustment of the mA and/or kV according to patient size and/or use of iterative reconstruction technique. COMPARISON:  Feb 06, 2024 FINDINGS: Brain: There is generalized cerebral atrophy with widening of the extra-axial spaces and ventricular dilatation. There are areas of decreased attenuation within the white matter tracts of the supratentorial brain, consistent with microvascular disease changes. Chronic left cerebellar, left occipital lobe and right frontal parietal chronic infarcts are seen. Vascular: Moderate severity bilateral cavernous carotid artery calcification is noted. Skull: Normal. Negative for fracture or focal lesion. Sinuses/Orbits: There is mild to moderate severity bilateral ethmoid sinus mucosal thickening. And 8 mm anterior sphenoid sinus polyp versus mucous retention cyst is seen. Other: None. IMPRESSION: 1. Generalized cerebral atrophy with chronic left cerebellar, left occipital lobe and right frontal parietal chronic infarcts. 2. No acute intracranial abnormality. 3. Mild to moderate severity bilateral ethmoid sinus disease. Electronically Signed   By: Suzen Dials M.D.   On: 07/02/2024 12:38    Discharge Medications   Allergies as of 07/04/2024       Reactions   Neosporin  [neomycin-bacitracin Zn-polymyx] Hives   Doxycycline  Nausea And Vomiting   Mushroom Extract Complex (obsolete) Nausea And Vomiting   Tape Other (See Comments)   Some tapes/dressings can irritate the skin        Medication List     STOP taking these medications    ciprofloxacin  500 MG tablet Commonly known as: CIPRO        TAKE these medications    acetaminophen  325 MG tablet Commonly known as: TYLENOL  Take 2 tablets (650 mg total) by mouth every 4 (four) hours as needed for headache or mild pain (pain score 1-3).   allopurinol  100 MG tablet Commonly known as: ZYLOPRIM  Take 0.5 tablets (50 mg total) by mouth daily.   cefadroxil  500 MG capsule Commonly known as: DURICEF Take 2 capsules (1,000 mg total) by mouth 2 (two) times daily.   furosemide  20 MG tablet Commonly known as: LASIX  Take 3 tablets (60 mg total) by mouth daily. What changed: additional instructions   magnesium  oxide 400 (240 Mg) MG  tablet Commonly known as: MAG-OX Take 1 tablet (400 mg total) by mouth daily.   ONE A DAY MEN 50 PLUS PO Take 1 tablet by mouth daily.   pantoprazole  40 MG tablet Commonly known as: PROTONIX  Take twice a day for 1 month and then can decrease back to once daily.   potassium chloride  10 MEQ tablet Commonly known as: KLOR-CON  M Take 2 tablets (20 mEq total) by mouth daily.   rosuvastatin  10 MG tablet Commonly known as: CRESTOR  Take 1 tablet (10 mg total) by mouth at bedtime.   sildenafil  20 MG tablet Commonly known as: REVATIO  Take 1 tablet (20 mg total) by mouth 3 (three) times daily.   warfarin 2 MG tablet Commonly known as: COUMADIN  Take as directed. If you are unsure how to take this medication, talk to your nurse or doctor. Original instructions: Take 1 tablet (2 mg total) by mouth daily.        Disposition   The patient will be discharged in stable condition to home.      APP Duration of Discharge Encounter: 15  Signed, Beckey LITTIE Coe AGACNP-BC   07/04/2024, 12:03 PM  Patient seen with NP, I formulated the plan and agree with the above note.   Please see my note from earlier today describing my assessment and plan.   MD Duration of Discharge Encounter: 24

## 2024-07-03 NOTE — Progress Notes (Signed)
 LVAD Coordinator Rounding Note:  Pt admitted 07/02/24 for UTI/bacteremia.   Feeling better today.  Afebrile.     E coli in blood and urine, now on Zosyn pending sensitivities.   Vital signs: Temp: 98.4 HR: 68 Doppler Pressure: 70 Auto Cuff BP: 99/75 (80) O2 Sat: 96% on RA Wt: 185.1 lbs    LVAD interrogation reveals:  Speed: 5300 Flow: 4.6 Power: 3.8 w PI: 3.2 Hematocrit: 32  Alarms: none Events: rare  Fixed speed: 5300 Low speed limit: 5000  Drive Line:  Weekly dressing changes per VAD coordinator or pts wife. Next dressing change due: 07/04/24 by caregiver.   Labs:  Hgb trend: 10.4  LDH trend: 126  INR trend: 2.2  WBC: 11>6.3  Anticoagulation Plan: -INR Goal: 2-2.5   Blood Products:  Device: - Cabin crew - V paced 60 -Therapies: OFF  Infection:   Plan/Recommendations:  Page VAD coordinator for drive line or equipment concerns 2. Weekly dressing changes per VAD Coordinator or caregiver.   Lauraine Ip RN VAD Coordinator  Office: [phone]  24/7 Pager: [phone]

## 2024-07-03 NOTE — TOC Initial Note (Signed)
 Transition of Care [facility]) - Initial/Assessment Note    Patient Details  Name: Isaac [name] MRN: [id number] Date of Birth: August 05, 1951  Transition of Care [facility]) CM/SW Contact:    [name] Phone Number: [phone] 07/03/2024, 3:03 PM  Clinical Narrative:  HF CSW met with patient at bedside. Patient stated that he lives alone. Patient stated that he has no history of HH services. Patient stated that he uses a cane at home. Patient stated that has a scale at home. Patient stated that he has a PCP. CSW explained that a hospital follow up appointment will be scheduled closer towards dc. Patient agrees and requested morning appointment. Patients wife will provide transportation at dc.   HF CSW/CM will continue to follow and monitor for dc readiness.                        Patient Goals and CMS Choice            Expected Discharge Plan and Services                                              Prior Living Arrangements/Services                       Activities of Daily Living   ADL Screening (condition at time of admission) Independently performs ADLs?: Yes (appropriate for developmental age) Is the patient deaf or have difficulty hearing?: No Does the patient have difficulty seeing, even when wearing glasses/contacts?: No Does the patient have difficulty concentrating, remembering, or making decisions?: No  Permission Sought/Granted                  Emotional Assessment              Admission diagnosis:  Urinary tract infection [N39.0] Disorientation [R41.0] Acute cystitis without hematuria [N30.00] LVAD (left ventricular assist device) present (HCC) [Z95.811] Patient Active Problem List   Diagnosis Date Noted   Urinary tract infection 07/02/2024   Iron  deficiency anemia, unspecified 05/30/2024   Acute gastric ulcer with hemorrhage 03/29/2024   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   Melena 03/22/2024   ABLA (acute blood loss  anemia) 03/22/2024   Anticoagulated 03/22/2024   AVM (arteriovenous malformation) of small bowel, acquired with hemorrhage 03/22/2024   Upper GI bleed 03/21/2024   Hematoma 02/17/2024   Anemia 02/15/2024   Hypotension 02/15/2024   Sundowning 02/11/2024   Insomnia 02/11/2024   Constipation 02/11/2024   Stage 3b chronic kidney disease (HCC) 02/11/2024   Cardioembolic stroke (HCC) 02/10/2024   Acute left PCA stroke (HCC) 02/05/2024   Abnormal thyroid  blood test 04/05/2023   Abscess of left groin 11/27/2022   VT (ventricular tachycardia) (HCC) 10/27/2022   ICD (implantable cardioverter-defibrillator) in place 10/27/2022   Pressure injury of skin 10/07/2021   LVAD (left ventricular assist device) present (HCC)    Protein-calorie malnutrition, severe 09/10/2021   Elevated TSH    PICC (peripherally inserted central catheter) in place    Cardiogenic shock (HCC)    Syncope 09/07/2021   CHF (congestive heart failure) (HCC) 09/06/2021   CAD (coronary artery disease) 08/22/2021   Cardiomyopathy (HCC) 08/22/2021   Mixed hyperlipidemia 08/22/2021   A-fib (HCC) 08/22/2021   Elevated troponin 08/22/2021   Acute on chronic combined systolic and diastolic CHF (congestive heart failure) (HCC)  08/22/2021   Benign essential hypertension 02/22/2015   Chronic systolic heart failure (HCC) 07/05/2014   PCP:  Stanton Lynwood FALCON, MD Pharmacy:   [facility] [phone] [address], [facility] - [address] [place] [address] Phone: [phone] Fax: [phone]  [facility] [address] [place] - [address] [place] [address] Phone: [phone] Fax: [phone]  [facility] [address] [place] [address] Phone: [phone] Fax: [phone]     Social Drivers of Health (SDOH) Social History: SDOH Screenings   Food Insecurity: No Food Insecurity (07/02/2024)  Housing: Low Risk  (07/02/2024)   Transportation Needs: No Transportation Needs (07/02/2024)  Utilities: Not At Risk (07/02/2024)  Depression (PHQ2-9): Low Risk  (04/14/2022)  Social Connections: Moderately Integrated (07/02/2024)  Tobacco Use: Medium Risk (07/02/2024)   SDOH Interventions:     Readmission Risk Interventions     No data to display

## 2024-07-03 NOTE — Plan of Care (Signed)
  Problem: Education: Goal: Patient will understand all VAD equipment and how it functions Outcome: Progressing Goal: Patient will be able to verbalize current INR target range and antiplatelet therapy for discharge home Outcome: Progressing   Problem: Cardiac: Goal: LVAD will function as expected and patient will experience no clinical alarms Outcome: Progressing   Problem: Education: Goal: Knowledge of General Education information will improve Description: Including pain rating scale, medication(s)/side effects and non-pharmacologic comfort measures Outcome: Progressing   Problem: Health Behavior/Discharge Planning: Goal: Ability to manage health-related needs will improve Outcome: Progressing   Problem: Clinical Measurements: Goal: Ability to maintain clinical measurements within normal limits will improve Outcome: Progressing Goal: Will remain free from infection Outcome: Not Progressing Goal: Diagnostic test results will improve Outcome: Progressing Goal: Respiratory complications will improve Outcome: Progressing Goal: Cardiovascular complication will be avoided Outcome: Progressing   Problem: Activity: Goal: Risk for activity intolerance will decrease Outcome: Progressing   Problem: Nutrition: Goal: Adequate nutrition will be maintained Outcome: Not Progressing   Problem: Coping: Goal: Level of anxiety will decrease Outcome: Progressing   Problem: Elimination: Goal: Will not experience complications related to bowel motility Outcome: Not Progressing Goal: Will not experience complications related to urinary retention Outcome: Progressing   Problem: Pain Managment: Goal: General experience of comfort will improve and/or be controlled Outcome: Progressing   Problem: Safety: Goal: Ability to remain free from injury will improve Outcome: Progressing   Problem: Skin Integrity: Goal: Risk for impaired skin integrity will decrease Outcome: Progressing

## 2024-07-03 NOTE — Progress Notes (Addendum)
 Advanced Heart Failure VAD Team Note  PCP-Cardiologist: Ezra Shuck, MD   Subjective:    Feeling better today.  Afebrile.    E coli in blood and urine, now on Zosyn pending sensitivities.   MAP 80, creatinine stable at 2.   Renal US : No hydronephrosis, polypoid lesion along the mucosal surface of the posterior bladder wall, mucosal neoplasm not excluded.   LVAD INTERROGATION:  HeartMate 3 LVAD:   Flow 4.6 liters/min, speed 5300, power 3.8, PI 3.2.  2 PI events.    Objective:    Vital Signs:   Temp:  [98 F (36.7 C)-99.3 F (37.4 C)] 98.4 F (36.9 C) (10/13 0810) Pulse Rate:  [60-110] 60 (10/13 0810) Resp:  [16-21] 18 (10/13 0810) BP: (95-108)/(62-85) 99/75 (10/13 0810) SpO2:  [95 %-100 %] 96 % (10/13 0810) Weight:  [84 kg] 84 kg (10/13 0547) Last BM Date : 07/03/24 Mean arterial Pressure 80  Intake/Output:   Intake/Output Summary (Last 24 hours) at 07/03/2024 1043 Last data filed at 07/03/2024 0843 Gross per 24 hour  Intake 1291.65 ml  Output 375 ml  Net 916.65 ml     Physical Exam    General:  Well appearing. No resp difficulty HEENT: normal Neck: supple. JVP 9-10 cm with prominent CV waves. Carotids 2+ bilat; no bruits. No lymphadenopathy or thyromegaly appreciated. Cor: Mechanical heart sounds with LVAD hum present. Lungs: clear Abdomen: soft, nontender, nondistended. No hepatosplenomegaly. No bruits or masses. Good bowel sounds. Driveline: C/D/I; securement device intact and driveline incorporated Extremities: no cyanosis, clubbing, rash, edema Neuro: alert & orientedx3, cranial nerves grossly intact. moves all 4 extremities w/o difficulty. Affect pleasant   Telemetry   A-paced with rate around 60 bpm (personally reviewed)  Labs   Basic Metabolic Panel: Recent Labs  Lab 07/01/24 1856 07/02/24 0752 07/03/24 0232  NA 136 142 135  K 3.7 3.6 3.5  CL 104 104 103  CO2 21* 21* 23  GLUCOSE 95 110* 105*  BUN 27* 31* 31*  CREATININE 2.02* 2.07*  2.01*  CALCIUM  9.0 8.8* 8.6*    Liver Function Tests: Recent Labs  Lab 07/01/24 1856  AST 25  ALT 21  ALKPHOS 129*  BILITOT 2.7*  PROT 5.7*  ALBUMIN  2.5*   No results for input(s): LIPASE, AMYLASE in the last 168 hours. Recent Labs  Lab 07/02/24 0952  AMMONIA 28    CBC: Recent Labs  Lab 07/01/24 1856 07/02/24 0752 07/03/24 0232  WBC 12.6* 11.0* 6.3  NEUTROABS 10.7* 9.1*  --   HGB 10.9* 10.4* 10.4*  HCT 35.5* 33.9* 32.5*  MCV 82.9 82.1 79.7*  PLT 214 211 201    INR: Recent Labs  Lab 06/30/24 0000 07/02/24 1325 07/03/24 0232  INR 1.9* 2.0* 2.2*    Other results: EKG:    Imaging   US  RENAL Result Date: 07/03/2024 CLINICAL DATA:  Urinary tract infection. EXAM: RENAL / URINARY TRACT ULTRASOUND COMPLETE COMPARISON:  CT scan 03/21/2024. FINDINGS: Right Kidney: Renal measurements: 9.5 x 6.2 x 6.4 cm = volume: 197 mL. Echogenicity within normal limits. No mass or hydronephrosis visualized. Left Kidney: Renal measurements: 9.9 x 5.6 x 5.4 cm = volume: 160 mL. Echogenicity within normal limits. No mass or hydronephrosis visualized. Bladder: Polypoid lesion identified along the mucosal surface of the posterior bladder wall. While no definite color flow can be demonstrated within this structure on Doppler assessment, mucosal neoplasm is not excluded. Other: None. IMPRESSION: 1. No evidence for hydronephrosis. 2. Polypoid lesion along the mucosal surface of  the posterior bladder wall. While no definite color flow can be demonstrated within this structure on Doppler assessment, mucosal neoplasm is not excluded. Hematuria protocol CT with and without contrast or cystoscopy recommended to further evaluate. Electronically Signed   By: Camellia Candle M.D.   On: 07/03/2024 06:44   CT Head Wo Contrast Result Date: 07/02/2024 CLINICAL DATA:  Altered mental status. EXAM: CT HEAD WITHOUT CONTRAST TECHNIQUE: Contiguous axial images were obtained from the base of the skull through  the vertex without intravenous contrast. RADIATION DOSE REDUCTION: This exam was performed according to the departmental dose-optimization program which includes automated exposure control, adjustment of the mA and/or kV according to patient size and/or use of iterative reconstruction technique. COMPARISON:  Feb 06, 2024 FINDINGS: Brain: There is generalized cerebral atrophy with widening of the extra-axial spaces and ventricular dilatation. There are areas of decreased attenuation within the white matter tracts of the supratentorial brain, consistent with microvascular disease changes. Chronic left cerebellar, left occipital lobe and right frontal parietal chronic infarcts are seen. Vascular: Moderate severity bilateral cavernous carotid artery calcification is noted. Skull: Normal. Negative for fracture or focal lesion. Sinuses/Orbits: There is mild to moderate severity bilateral ethmoid sinus mucosal thickening. And 8 mm anterior sphenoid sinus polyp versus mucous retention cyst is seen. Other: None. IMPRESSION: 1. Generalized cerebral atrophy with chronic left cerebellar, left occipital lobe and right frontal parietal chronic infarcts. 2. No acute intracranial abnormality. 3. Mild to moderate severity bilateral ethmoid sinus disease. Electronically Signed   By: Suzen Dials M.D.   On: 07/02/2024 12:38   DG CHEST PORT 1 VIEW Result Date: 07/01/2024 CLINICAL DATA:  Chest pain and fever.  History of LVAD. EXAM: PORTABLE CHEST 1 VIEW COMPARISON:  03/21/2024. FINDINGS: The heart is enlarged and an LVAD device is noted and appears stable in position. Sternotomy wires are present over the midline. There is a multi lead pacemaker device over the left chest. The mediastinal contour stable. Atherosclerotic calcification of the aorta is noted. No consolidation, effusion, or pneumothorax. No acute osseous abnormality. Surgical clips are noted in the subclavian region on the right. IMPRESSION: Stable chest with no  active disease. Electronically Signed   By: Leita Birmingham M.D.   On: 07/01/2024 18:00     Medications:     Scheduled Medications:  allopurinol   50 mg Oral Daily   magnesium  oxide  400 mg Oral Daily   pantoprazole   40 mg Oral Daily   rosuvastatin   10 mg Oral QHS   sildenafil   20 mg Oral TID   Warfarin - Pharmacist Dosing Inpatient   Does not apply q1600    Infusions:  piperacillin-tazobactam (ZOSYN)  IV 3.375 g (07/03/24 0617)    PRN Medications: acetaminophen , acetaminophen , ondansetron  (ZOFRAN ) IV, mouth rinse    Assessment/Plan:    1. E coli UTI: He has E coli in blood and urine.  Has prior history of UTI.  Renal US  showed no hydronephrosis, polypoid lesion along the mucosal surface of the posterior bladder wall, mucosal neoplasm not excluded. He is now afebrile with normal WBCs, feels better.  - Continue Zosyn for now pending sensitivities.  - Repeat blood cultures tomorrow.  - Will need followup with urology as outpatient given recurrent UTI in male, also with polypoid lesion in bladder and likely need for cystoscopy.  2. 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  Melena has resolved. He has had IV Fe for Fe deficiency.  Hgb stable at 10.4.  -  Continue Protonix  40 mg daily.  - I have recommended that he start monthly octreotide  infusions to decrease risk for recurrent AVM bleeding.  He does not want to start this yet but is willing to start it if he has recurrent bleeding.  3. Chronic systolic CHF:  Long-standing cardiomyopathy.  [employer] dual chamber ICD.  Echo in 12/22 with EF < 20%, severe LV dilation, restrictive diastolic function, moderate RV dysfunction, moderate MR, mod-severe TR. Cause of cardiomyopathy is uncertain.  He has a history of CAD, but I do not think that the described CAD from the past could explain his cardiomyopathy, but CAD could have progressed.  He was admitted in 12/22 with cardiogenic shock and Impella 5.5. s/p HM III VAD  on 09/17/22.  Post-op course complicated by AKI and RV failure. He was on HD for several months but is now off.  Echo in 7/24 showed EF < 20%, IV septum midline to slightly leftward, RV moderately dilated with moderately decreased systolic function, IVC dilated with severe TR.  Echo in 5/25 with EF 20-25%, mild LVH, LVAD present, mild RV dysfunction, moderate AI, mild RV dysfunction with normal RV size, severe TR. LVAD parameters reviewed today and stable, no alarms. JVP mildly elevated today in setting of severe TR. Creatinine is at baseline at 2.  - Lasix  held with E coli bacteremia, will restart Lasix  60 mg daily tomorrow (home med).  - Continue sildenafil  20 mg tid.  - Continue warfarin, INR goal decreased to 2-2.5 with GI bleeding.  4. Tricuspid regurgitation:  Tricuspid repair not done at time of VAD due to proximity of ICD wires and hypotension during surgery. He has severe TR.  5. Atrial fibrillation: Paroxysmal.  He is off amiodarone  with resolution of nausea.  I will not restart it.  He has been a-paced recently.  6. CKD stage IV: Suspect intra-op hypotension at the time of LVAD placement led to development of ATN => urine sediment looked like ATN per renal. Had initial CVVH and transitioned over to HD. He is now off HD. Creatinine 2 today, this is baseline.  7. CAD: History of PCI to OM1 in 2007 and RCA in 2013.  No chest pain.  - Deferred cardiac cath in 12/22 due to AKI and plan for VAD - Continue Crestor .   8. Abnormal thyroid  function tests: Patient has had elevated TSH and free T4, mixed pattern. We have stopped amiodarone . 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  Most recent TSH was normal.  - He follows with endocrinology.   9. 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  INR goal increased to 2.5-3 but then decreased back to 2-2.5 with GI bleeding.  - Continue warfarin, INR 2.2 today (at goal).    Hopefully he will be ready for home tomorrow.  Mobilize.   I reviewed the LVAD parameters from today, and compared the results to the patient's prior recorded data.  No programming changes were made.  The LVAD is functioning within specified parameters.  The patient performs LVAD self-test daily.  LVAD interrogation was negative for any significant power changes, alarms or PI events/speed drops.  LVAD equipment check completed and is in good working order.  Back-up equipment present.   LVAD education done on emergency procedures and precautions and reviewed exit site care.  Length of Stay: 1  Ezra Shuck, MD 07/03/2024, 10:43 AM  VAD Team --- VAD ISSUES ONLY--- Pager [phone] (7am - 7am)  Advanced Heart Failure Team  Pager [phone] (M-F; 7a - 5p)  Please contact [facility] for night-coverage after hours (5p -7a ) and weekends on [email or web address]

## 2024-07-04 ENCOUNTER — Ambulatory Visit: Admitting: Physical Therapy

## 2024-07-04 ENCOUNTER — Ambulatory Visit: Admitting: Speech Pathology

## 2024-07-04 ENCOUNTER — Other Ambulatory Visit (HOSPITAL_COMMUNITY): Payer: Self-pay

## 2024-07-04 ENCOUNTER — Encounter

## 2024-07-04 ENCOUNTER — Telehealth (HOSPITAL_BASED_OUTPATIENT_CLINIC_OR_DEPARTMENT_OTHER): Payer: Self-pay | Admitting: *Deleted

## 2024-07-04 DIAGNOSIS — N390 Urinary tract infection, site not specified: Secondary | ICD-10-CM

## 2024-07-04 DIAGNOSIS — I5022 Chronic systolic (congestive) heart failure: Secondary | ICD-10-CM | POA: Diagnosis not present

## 2024-07-04 DIAGNOSIS — R7881 Bacteremia: Secondary | ICD-10-CM

## 2024-07-04 DIAGNOSIS — Z95811 Presence of heart assist device: Secondary | ICD-10-CM | POA: Diagnosis not present

## 2024-07-04 LAB — BASIC METABOLIC PANEL WITH GFR
Anion gap: 10 (ref 5–15)
BUN: 29 mg/dL — ABNORMAL HIGH (ref 8–23)
CO2: 23 mmol/L (ref 22–32)
Calcium: 8.6 mg/dL — ABNORMAL LOW (ref 8.9–10.3)
Chloride: 106 mmol/L (ref 98–111)
Creatinine, Ser: 2.2 mg/dL — ABNORMAL HIGH (ref 0.61–1.24)
GFR, Estimated: 31 mL/min — ABNORMAL LOW (ref >60)
Glucose, Bld: 102 mg/dL — ABNORMAL HIGH (ref 70–99)
Potassium: 3.9 mmol/L (ref 3.5–5.1)
Sodium: 139 mmol/L (ref 135–145)

## 2024-07-04 LAB — CBC
HCT: 30.8 % — ABNORMAL LOW (ref 39.0–52.0)
Hemoglobin: 9.8 g/dL — ABNORMAL LOW (ref 13.0–17.0)
MCH: 25.3 pg — ABNORMAL LOW (ref 26.0–34.0)
MCHC: 31.8 g/dL (ref 30.0–36.0)
MCV: 79.4 fL — ABNORMAL LOW (ref 80.0–100.0)
Platelets: 187 K/uL (ref 150–400)
RBC: 3.88 MIL/uL — ABNORMAL LOW (ref 4.22–5.81)
RDW: 18.8 % — ABNORMAL HIGH (ref 11.5–15.5)
WBC: 4 K/uL (ref 4.0–10.5)
nRBC: 0 % (ref 0.0–0.2)

## 2024-07-04 LAB — CULTURE, BLOOD (ROUTINE X 2): Special Requests: ADEQUATE

## 2024-07-04 LAB — PROTIME-INR
INR: 2.5 — ABNORMAL HIGH (ref 0.8–1.2)
Prothrombin Time: 27.8 s — ABNORMAL HIGH (ref 11.4–15.2)

## 2024-07-04 LAB — LACTATE DEHYDROGENASE: LDH: 117 U/L (ref 98–192)

## 2024-07-04 MED ORDER — WARFARIN SODIUM 2 MG PO TABS: 2.0000 mg | ORAL_TABLET | Freq: Every day | ORAL | Status: DC

## 2024-07-04 MED ORDER — CEFADROXIL 500 MG PO CAPS
1000.0000 mg | ORAL_CAPSULE | Freq: Two times a day (BID) | ORAL | 0 refills | Status: DC
  Filled 2024-07-04: qty 40, 10d supply, fill #0

## 2024-07-04 MED ORDER — CEFADROXIL 500 MG PO CAPS
1000.0000 mg | ORAL_CAPSULE | Freq: Two times a day (BID) | ORAL | Status: DC
  Administered 2024-07-04: 1000 mg via ORAL
  Filled 2024-07-04: qty 2

## 2024-07-04 MED ORDER — WARFARIN 0.5 MG HALF TABLET
0.5000 mg | ORAL_TABLET | Freq: Once | ORAL | Status: AC
  Administered 2024-07-04: 0.5 mg via ORAL
  Filled 2024-07-04 (×3): qty 1

## 2024-07-04 NOTE — Evaluation (Signed)
 Physical Therapy Brief Evaluation and Discharge Note Patient Details Name: Isaac [name] MRN: [id number] DOB: [birth year] Today's Date: 07/04/2024   History of Present Illness  73 y.o. male admitted 10/12 for E coli UTI/Bacteremia.  history of CAD, HTN, atrial fibrillation, and chronic systolic CHF s/p LVAD HM3  Clinical Impression  Patient evaluated by Physical Therapy with no further acute PT needs identified. All education has been completed and the patient has no further questions. Minor instability with gait, encouraged safety awareness and SPC use, OPPT follow-up recommended. Safely navigated stairs with supervision and use of rail. Wife present and supportive during visit. All questions answered. See below for any follow-up Physical Therapy or equipment needs. PT is signing off. Thank you for this referral.        PT Assessment Patient does not need any further PT services  Assistance Needed at Discharge  Frequent or constant Supervision/Assistance    Equipment Recommendations None recommended by PT  Recommendations for Other Services       Precautions/Restrictions Precautions Precautions: Fall Recall of Precautions/Restrictions: Intact Precaution/Restrictions Comments: LVAD Required Braces or Orthoses: Other Brace (LVAD KIT) Restrictions Weight Bearing Restrictions Per Provider Order: No        Mobility  Bed Mobility       General bed mobility comments: Sitting EOB when PT entered room.  Transfers Overall transfer level: Needs assistance Equipment used: None Transfers: Sit to/from Stand Sit to Stand: Supervision           General transfer comment: Supervision for safety, mild instability but able to self correct. Cues for awareness.    Ambulation/Gait Ambulation/Gait assistance: Supervision Gait Distance (Feet): 225 Feet Assistive device: None Gait Pattern/deviations: Step-through pattern, Decreased stride length, Drifts right/left Gait Speed:  Below normal General Gait Details: Minor instability noted, cues for awareness and safety. 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 No overt buckling. Educated on findings, recommendations for [facility] use. Wife observed during session.  Home Activity Instructions Home Activity Instructions: Requires supervision, guarding on stairs, encouraged SPC use which they already have at home. OPPT follow-up.  Stairs Stairs: Yes Stairs assistance: Supervision Stair Management: One rail Left, Step to pattern, Alternating pattern, Forwards Number of Stairs: 13 General stair comments: Alternates pattern going up, step to pattern going down. Lead with RLE due to knee pain reported with flexion. Supervision for safety, single rail used similar to home set-up. Wife present and observed; educated wife on guarding techniques. Both feel confident with completion of task.  Modified Rankin (Stroke Patients Only)        Balance Overall balance assessment: Needs assistance Sitting-balance support: No upper extremity supported, Feet supported Sitting balance-Leahy Scale: Good     Standing balance support: No upper extremity supported, During functional activity Standing balance-Leahy Scale: Fair            Pertinent Vitals/Pain PT - Brief Vital Signs All Vital Signs Stable: Yes Pain Assessment Pain Assessment: No/denies pain     Home Living Family/patient expects to be discharged to:: Private residence Living Arrangements: Spouse/significant other Available Help at Discharge: Family;Available 24 hours/day Home Environment: Stairs to enter;Stairs in home;Rail - left  Stairs-Number of Steps: 6+6+2 ([place]) Home Equipment: Rollator (4 wheels);Cane - single point        Prior Function Level of Independence: Independent with assistive device(s)      UE/LE Assessment        LE ROM/Strength/Tone/Coordination: Generalized  weakness  Communication   Communication Communication: No apparent difficulties     Cognition Overall Cognitive Status: Impaired Comments: wife reports improving but not quite baseline.     General Comments General comments (skin integrity, edema, etc.): Wife available to assist pt at home    Exercises     Assessment/Plan    PT Problem List         PT Visit Diagnosis Unsteadiness on feet (R26.81);Other abnormalities of gait and mobility (R26.89);Muscle weakness (generalized) (M62.81);Other symptoms and signs involving the nervous system (R29.898)    No Skilled PT All education completed;Patient will have necessary level of assist by caregiver at discharge;Patient is supervision for all activity/mobility   Co-evaluation                AMPAC 6 Clicks Help needed turning from your back to your side while in a flat bed without using bedrails?: None Help needed moving from lying on your back to sitting on the side of a flat bed without using bedrails?: None Help needed moving to and from a bed to a chair (including a wheelchair)?: A Little Help needed standing up from a chair using your arms (e.g., wheelchair or bedside chair)?: A Little Help needed to walk in hospital room?: A Little Help needed climbing 3-5 steps with a railing? : A Little 6 Click Score: 20      End of Session   Activity Tolerance: Patient tolerated treatment well Patient left: in bed;with call bell/phone within reach;with bed alarm set;with nursing/sitter in room;with family/visitor present Nurse Communication: Mobility status PT Visit Diagnosis: Unsteadiness on feet (R26.81);Other abnormalities of gait and mobility (R26.89);Muscle weakness (generalized) (M62.81);Other symptoms and signs involving the nervous system (R29.898)     Time: 1110-1130 PT Time Calculation (min) (ACUTE ONLY): 20 min  Charges:   PT Evaluation $PT Eval Low Complexity: 1 Low      Isaac [name], PT, DPT [facility]  Health  Rehabilitation Services Physical Therapist Office: [phone] Website: [email or web address]   Isaac [name]  07/04/2024, 1:31 PM

## 2024-07-04 NOTE — Telephone Encounter (Signed)
 Post ED Visit - Positive Culture Follow-up  Culture report reviewed by antimicrobial stewardship pharmacist: [facility] []  Rankin Dee, Pharm.D. []  Venetia Gully, Pharm.D., BCPS AQ-ID []  Garrel Crews, Pharm.D., BCPS []  Almarie Lunger, [address].D., BCPS []  [place], [address].D., BCPS, AAHIVP []  Rosaline Bihari, Pharm.D., BCPS, AAHIVP []  Vernell Meier, PharmD, BCPS []  Latanya Hint, PharmD, BCPS []  Donald Medley, PharmD, BCPS []  Rocky Bold, PharmD []  Dorothyann Alert, PharmD, BCPS [x]  Heather Rocher,  PharmD  [facility] []  Rosaline Edison, PharmD []  Romona Bliss, PharmD []  Dolphus Roller, PharmD []  Veva Seip, Rph []  Vernell Daunt) Leonce, PharmD []  Eva Allis, PharmD []  Rosaline Millet, PharmD []  Iantha Batch, PharmD []  Arvin Gauss, PharmD []  Wanda Hasting, PharmD []  Ronal Rav, PharmD []  Rocky Slade, PharmD []  Bard Jeans, PharmD   Positive urine culture Treated with Ciprofloxacin , organism sensitive to the same and no further patient follow-up is required at this time.  Isaac [name] 07/04/2024, 9:07 AM

## 2024-07-04 NOTE — TOC Transition Note (Addendum)
 Transition of Care [facility]) - Discharge Note   Patient Details  Name: Isaac [name] MRN: [id number] Date of Birth: 09-28-1950  Transition of [facility]) CM/SW Contact:  Justina Delcia Czar, RN Phone Number: [phone] 07/04/2024, 12:59 PM   Clinical Narrative:     Spoke to pt and wants to remain independent at home. Pt requesting shower chair. Pt insurance does not cover. Pt states he will purchase a shower chair. Has scale at home for daily weights. Has cane to assist with ambulation.   Pt will contact PCP office to arrange hospital follow up appt.  Pt recommended pt for Outpatient PT, notified attending for order.     Contacted OP rehab [address] and they will transfer to [facility] rehab.   Final next level of care: Home/Self Care Barriers to Discharge: No Barriers Identified   Patient Goals and CMS Choice            Discharge Placement                       Discharge Plan and Services Additional resources added to the After Visit Summary for     Discharge Planning Services: CM Consult                                 Social Drivers of Health (SDOH) Interventions SDOH Screenings   Food Insecurity: No Food Insecurity (07/02/2024)  Housing: Low Risk  (07/02/2024)  Transportation Needs: No Transportation Needs (07/02/2024)  Utilities: Not At Risk (07/02/2024)  Depression (PHQ2-9): Low Risk  (04/14/2022)  Social Connections: Moderately Integrated (07/02/2024)  Tobacco Use: Medium Risk (07/02/2024)     Readmission Risk Interventions     No data to display

## 2024-07-04 NOTE — Progress Notes (Addendum)
 ANTICOAGULATION CONSULT NOTE  Pharmacy Consult for Warfarin Indication: LVAD  Allergies  Allergen Reactions   Neosporin [Neomycin-Bacitracin Zn-Polymyx] Hives   Doxycycline  Nausea And Vomiting   Mushroom Extract Complex (Obsolete) Nausea And Vomiting   Tape Other (See Comments)    Some tapes/dressings can irritate the skin    Patient Measurements: Height: 6' (182.9 cm) Weight: 77.9 kg (171 lb 11.8 oz) IBW/kg (Calculated) : 77.6  Vital Signs: Temp: 98.5 F (36.9 C) (10/14 0725) Temp Source: Oral (10/14 0725) BP: 101/71 (10/14 0725) Pulse Rate: 62 (10/14 0725)  Labs: Recent Labs    07/01/24 1856 07/01/24 2055 07/02/24 0752 07/02/24 1325 07/03/24 0232 07/04/24 0400  HGB 10.9*  --  10.4*  --  10.4* 9.8*  HCT 35.5*  --  33.9*  --  32.5* 30.8*  PLT 214  --  211  --  201 187  LABPROT  --   --   --  23.4* 25.6* 27.8*  INR  --   --   --  2.0* 2.2* 2.5*  CREATININE 2.02*  --  2.07*  --  2.01* 2.20*  CKTOTAL 27*  --   --   --   --   --   TROPONINIHS 58* 61*  --   --   --   --     Estimated Creatinine Clearance: 33.3 mL/min (A) (by C-G formula based on SCr of 2.2 mg/dL (H)).   Medical History: Past Medical History:  Diagnosis Date   Arrhythmia    atrial fibrillation   CHF (congestive heart failure) (HCC)    Chronic kidney disease    Coronary artery disease    Hyperlipidemia    Hypertension    Myocardial infarct (HCC)     Medications:  Medications Prior to Admission  Medication Sig Dispense Refill Last Dose/Taking   acetaminophen  (TYLENOL ) 325 MG tablet Take 2 tablets (650 mg total) by mouth every 4 (four) hours as needed for headache or mild pain (pain score 1-3).   Unknown   furosemide  (LASIX ) 20 MG tablet Take 3 tablets (60 mg total) by mouth daily. (Patient taking differently: Take 60 mg by mouth daily. Dose also depends on fluid intake) 90 tablet 11 06/30/2024   magnesium  oxide (MAG-OX) 400 (240 Mg) MG tablet Take 1 tablet (400 mg total) by mouth daily. 30  tablet 0 Past Week   Multiple Vitamins-Minerals (ONE A DAY MEN 50 PLUS PO) Take 1 tablet by mouth daily.   Past Week   pantoprazole  (PROTONIX ) 40 MG tablet Take twice a day for 1 month and then can decrease back to once daily. 60 tablet 1 Unknown   potassium chloride  SA (KLOR-CON  M) 10 MEQ tablet Take 2 tablets (20 mEq total) by mouth daily. 180 tablet 3 Past Week   rosuvastatin  (CRESTOR ) 10 MG tablet Take 1 tablet (10 mg total) by mouth at bedtime. 30 tablet 0 06/30/2024   sildenafil  (REVATIO ) 20 MG tablet Take 1 tablet (20 mg total) by mouth 3 (three) times daily. 90 tablet 0 07/01/2024 at 12:00 AM   warfarin (COUMADIN ) 2 MG tablet Take 2 mg by mouth every Tuesday, Thursday, and Saturday and 4 mg all other days (Patient taking differently: Take 2 mg by mouth daily.) 40 tablet 11 07/01/2024 at 12:00 AM   allopurinol  (ZYLOPRIM ) 100 MG tablet Take 0.5 tablets (50 mg total) by mouth daily. (Patient not taking: Reported on 07/02/2024) 15 tablet 0 Not Taking   ciprofloxacin  (CIPRO ) 500 MG tablet Take 1 tablet (500 mg total)  by mouth every 12 (twelve) hours for 7 days. 14 tablet 0    Scheduled:   allopurinol   50 mg Oral Daily   furosemide   60 mg Oral Daily   magnesium  oxide  400 mg Oral Daily   pantoprazole   40 mg Oral Daily   rosuvastatin   10 mg Oral QHS   sildenafil   20 mg Oral TID   Warfarin - Pharmacist Dosing Inpatient   Does not apply q1600   Infusions:   piperacillin-tazobactam (ZOSYN)  IV 3.375 g (07/04/24 0516)   PRN: acetaminophen , acetaminophen , ondansetron  (ZOFRAN ) IV, mouth rinse  Assessment: 73 yom with a history of CAD, HTN, AF, HF s/p LVAD HM3. Patient is presenting with weakness. Warfarin per pharmacy consult placed for LVAD.  Warfarin dose per last AC visit 4 mg (2 mg x 2) every Mon, Wed, Fri; 2 mg (2 mg x 1) all other days but per med rec. Patient reports he has only been taking 2mg  daily - LD 10/11 per med rec. (INR goal 2-2.5) - INR was 1.9 on admission.   07/04/24: INR  today 2.5, high end of therapeutic range. CBC stable (Hgb 9.8, PLT 187).  Goal of Therapy:  INR Goal 2-2.5 Monitor platelets by anticoagulation protocol: Yes   Plan:  Give 0.5 mg warfarin today Monitor for s/s of hemorrhage, daily INR, CBC Watch for new DDIs  Recommend discharging patient on warfarin 2 mg daily, lower dose due to interaction with cefadroxil . Discussed plan with patient, will recheck INR on Friday.   Morna Breach, PharmD PGY2 Cardiology Pharmacy Resident 07/04/2024 8:02 AM

## 2024-07-04 NOTE — Progress Notes (Signed)
 Went over discharge paperwork with patient and family. All questions answered. PIV and telemetry removed. All belongings at beside.

## 2024-07-04 NOTE — Progress Notes (Addendum)
 Advanced Heart Failure VAD Team Note  PCP-Cardiologist: Ezra Shuck, MD   Subjective:    E coli in blood and urine, now on Zosyn. Sensitivities back, plan to deescalate abx today  MAP 70s-80s, creatinine stable at 2.2.   Renal US : No hydronephrosis, polypoid lesion along the mucosal surface of the posterior bladder wall, mucosal neoplasm not excluded.   Afebrile. No dysuria   LVAD INTERROGATION:  HeartMate 3 LVAD:   Flow 4.6 liters/min, speed 5350, power 3.9, PI 3.3.  x3 PI events.    Objective:    Vital Signs:   Temp:  [97.8 F (36.6 C)-98.9 F (37.2 C)] 97.8 F (36.6 C) (10/14 1106) Pulse Rate:  [56-73] 73 (10/14 1106) Resp:  [16-20] 17 (10/14 1106) BP: (92-105)/(71-79) 97/75 (10/14 1106) SpO2:  [96 %-98 %] 96 % (10/14 1106) Weight:  [77.9 kg] 77.9 kg (10/14 0654) Last BM Date : 07/04/24 Mean arterial Pressure 70s-80s  Intake/Output:   Intake/Output Summary (Last 24 hours) at 07/04/2024 1125 Last data filed at 07/04/2024 1108 Gross per 24 hour  Intake 540 ml  Output 725 ml  Net -185 ml     Physical Exam  General:  Well appearing. No resp difficulty Neck:  JVP flat.  Cor: Mechanical heart sounds with LVAD hum present. Lungs: Clear Driveline: C/D/I; securement device intact and driveline incorporated Extremities: no edema Neuro: alert & oriented x3. Affect pleasant  Telemetry   A-paced with rate around 60 bpm, short runs NSVT (personally reviewed)  Labs   Basic Metabolic Panel: Recent Labs  Lab 07/01/24 1856 07/02/24 0752 07/03/24 0232 07/04/24 0400  NA 136 142 135 139  K 3.7 3.6 3.5 3.9  CL 104 104 103 106  CO2 21* 21* 23 23  GLUCOSE 95 110* 105* 102*  BUN 27* 31* 31* 29*  CREATININE 2.02* 2.07* 2.01* 2.20*  CALCIUM  9.0 8.8* 8.6* 8.6*    Liver Function Tests: Recent Labs  Lab 07/01/24 1856  AST 25  ALT 21  ALKPHOS 129*  BILITOT 2.7*  PROT 5.7*  ALBUMIN  2.5*   No results for input(s): LIPASE, AMYLASE in the last 168  hours. Recent Labs  Lab 07/02/24 0952  AMMONIA 28    CBC: Recent Labs  Lab 07/01/24 1856 07/02/24 0752 07/03/24 0232 07/04/24 0400  WBC 12.6* 11.0* 6.3 4.0  NEUTROABS 10.7* 9.1*  --   --   HGB 10.9* 10.4* 10.4* 9.8*  HCT 35.5* 33.9* 32.5* 30.8*  MCV 82.9 82.1 79.7* 79.4*  PLT 214 211 201 187    INR: Recent Labs  Lab 06/30/24 0000 07/02/24 1325 07/03/24 0232 07/04/24 0400  INR 1.9* 2.0* 2.2* 2.5*    Other results: EKG:    Imaging   US  RENAL Result Date: 07/03/2024 CLINICAL DATA:  Urinary tract infection. EXAM: RENAL / URINARY TRACT ULTRASOUND COMPLETE COMPARISON:  CT scan 03/21/2024. FINDINGS: Right Kidney: Renal measurements: 9.5 x 6.2 x 6.4 cm = volume: 197 mL. Echogenicity within normal limits. No mass or hydronephrosis visualized. Left Kidney: Renal measurements: 9.9 x 5.6 x 5.4 cm = volume: 160 mL. Echogenicity within normal limits. No mass or hydronephrosis visualized. Bladder: Polypoid lesion identified along the mucosal surface of the posterior bladder wall. While no definite color flow can be demonstrated within this structure on Doppler assessment, mucosal neoplasm is not excluded. Other: None. IMPRESSION: 1. No evidence for hydronephrosis. 2. Polypoid lesion along the mucosal surface of the posterior bladder wall. While no definite color flow can be demonstrated within this structure on Doppler  assessment, mucosal neoplasm is not excluded. Hematuria protocol CT with and without contrast or cystoscopy recommended to further evaluate. Electronically Signed   By: Camellia Candle M.D.   On: 07/03/2024 06:44   CT Head Wo Contrast Result Date: 07/02/2024 CLINICAL DATA:  Altered mental status. EXAM: CT HEAD WITHOUT CONTRAST TECHNIQUE: Contiguous axial images were obtained from the base of the skull through the vertex without intravenous contrast. RADIATION DOSE REDUCTION: This exam was performed according to the departmental dose-optimization program which includes  automated exposure control, adjustment of the mA and/or kV according to patient size and/or use of iterative reconstruction technique. COMPARISON:  Feb 06, 2024 FINDINGS: Brain: There is generalized cerebral atrophy with widening of the extra-axial spaces and ventricular dilatation. There are areas of decreased attenuation within the white matter tracts of the supratentorial brain, consistent with microvascular disease changes. Chronic left cerebellar, left occipital lobe and right frontal parietal chronic infarcts are seen. Vascular: Moderate severity bilateral cavernous carotid artery calcification is noted. Skull: Normal. Negative for fracture or focal lesion. Sinuses/Orbits: There is mild to moderate severity bilateral ethmoid sinus mucosal thickening. And 8 mm anterior sphenoid sinus polyp versus mucous retention cyst is seen. Other: None. IMPRESSION: 1. Generalized cerebral atrophy with chronic left cerebellar, left occipital lobe and right frontal parietal chronic infarcts. 2. No acute intracranial abnormality. 3. Mild to moderate severity bilateral ethmoid sinus disease. Electronically Signed   By: Suzen Dials M.D.   On: 07/02/2024 12:38     Medications:     Scheduled Medications:  allopurinol   50 mg Oral Daily   furosemide   60 mg Oral Daily   magnesium  oxide  400 mg Oral Daily   pantoprazole   40 mg Oral Daily   rosuvastatin   10 mg Oral QHS   sildenafil   20 mg Oral TID   Warfarin - Pharmacist Dosing Inpatient   Does not apply q1600    Infusions:  piperacillin-tazobactam (ZOSYN)  IV 3.375 g (07/04/24 0516)    PRN Medications: acetaminophen , acetaminophen , ondansetron  (ZOFRAN ) IV, mouth rinse    Assessment/Plan:   1. E coli UTI: He has E coli in blood and urine.  Has prior history of UTI.  Renal US  showed no hydronephrosis, polypoid lesion along the mucosal surface of the posterior bladder wall, mucosal neoplasm not excluded. He is now afebrile with normal WBCs, feels better.   - Plan to deescalate abx today> cefadroxil  x10 days - Will need followup with urology as outpatient given recurrent UTI in male, also with polypoid lesion in bladder and likely need for cystoscopy.   2. 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  Melena has resolved. He has had IV Fe for Fe deficiency.  Hgb stable at 10.4.  - Continue Protonix  40 mg daily.  - We have recommended that he start monthly octreotide  infusions to decrease risk for recurrent AVM bleeding.  He does not want to start this yet but is willing to start it if he has recurrent bleeding.   3. Chronic systolic CHF:  Long-standing cardiomyopathy.  [employer] dual chamber ICD.  Echo in 12/22 with EF < 20%, severe LV dilation, restrictive diastolic function, moderate RV dysfunction, moderate MR, mod-severe TR. Cause of cardiomyopathy is uncertain.  He has a history of CAD, but I do not think that the described CAD from the past could explain his cardiomyopathy, but CAD could have progressed.  He was admitted in 12/22 with cardiogenic shock and Impella 5.5. s/p HM III VAD on  09/17/22.  Post-op course complicated by AKI and RV failure. He was on HD for several months but is now off.  Echo in 7/24 showed EF < 20%, IV septum midline to slightly leftward, RV moderately dilated with moderately decreased systolic function, IVC dilated with severe TR.  Echo in 5/25 with EF 20-25%, mild LVH, LVAD present, mild RV dysfunction, moderate AI, mild RV dysfunction with normal RV size, severe TR. LVAD parameters reviewed today and stable, no alarms. JVP mildly elevated today in setting of severe TR. Creatinine is at baseline at 2.  - Continue restart Lasix  60 mg daily.  - Continue sildenafil  20 mg tid.  - Continue warfarin, INR goal decreased to 2-2.5 with GI bleeding.   4. Tricuspid regurgitation:  Tricuspid repair not done at time of VAD due to proximity of ICD wires and hypotension during surgery. He has severe TR.   5.  Atrial fibrillation: Paroxysmal.  He is off amiodarone  with resolution of nausea. Will not restart it.  He has been a-paced recently.   6. CKD stage IV: Suspect intra-op hypotension at the time of LVAD placement led to development of ATN => urine sediment looked like ATN per renal. Had initial CVVH and transitioned over to HD. He is now off HD. Creatinine 2.2 today, this is baseline.   7. CAD: History of PCI to OM1 in 2007 and RCA in 2013.  No chest pain.  - Deferred cardiac cath in 12/22 due to AKI and plan for VAD - Continue Crestor .    8. Abnormal thyroid  function tests: Patient has had elevated TSH and free T4, mixed pattern. We have stopped amiodarone . 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  Most recent TSH was normal.  - He follows with endocrinology.    9. 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  INR goal increased to 2.5-3 but then decreased back to 2-2.5 with GI bleeding.  - Continue warfarin, INR 2.5 today (at goal).   Hopefully he will be ready for home today.  Mobilize.   I reviewed the LVAD parameters from today, and compared the results to the patient's prior recorded data.  No programming changes were made.  The LVAD is functioning within specified parameters.  The patient performs LVAD self-test daily.  LVAD interrogation was negative for any significant power changes, alarms or PI events/speed drops.  LVAD equipment check completed and is in good working order.  Back-up equipment present.   LVAD education done on emergency procedures and precautions and reviewed exit site care.  Length of Stay: 2  Beckey LITTIE Coe, NP 07/04/2024, 11:25 AM  VAD Team --- VAD ISSUES ONLY--- Pager [phone] (7am - 7am)  Advanced Heart Failure Team  Pager [phone] (M-F; 7a - 5p)  Please contact [facility] for night-coverage after hours (5p -7a ) and weekends on [email or web address]  Patient seen with NP, I  formulated the plan and agree with the above note.   No complaints this morning, feels much better.  Creatinine 2 => 2.2.  INR 2.5.    LVAD parameters stable on interrogation.   General: Well appearing this am. NAD.  HEENT: Normal. Neck: Supple, JVP 7-8 cm. Carotids OK.  Cardiac:  Mechanical heart sounds with LVAD hum present.  Lungs:  CTAB, normal effort.  Abdomen:  NT, ND, no HSM. No bruits or masses. +BS  LVAD exit site: Well-healed and incorporated. Dressing dry and intact. No erythema or drainage. Stabilization device present  and accurately applied. Driveline dressing changed daily per sterile technique. Extremities:  Warm and dry. No cyanosis, clubbing, rash, or edema.  Neuro:  Alert & oriented x 3. Cranial nerves grossly intact. Moves all 4 extremities w/o difficulty. Affect pleasant    I think he can go home today.  Doing much better. He will take cefadroxil  for 10 days for E coli bacteremia (sensitive).  He will need outpatient followup with urology for bladder lesion and recurrent UTIs in a male.   Ezra Shuck 07/04/2024 12:16 PM

## 2024-07-04 NOTE — Progress Notes (Signed)
 LVAD Coordinator Rounding Note:  Pt admitted 07/02/24 for UTI/bacteremia.   Feeling better today.  Afebrile.     Positive cultures for E coli in blood and urine placed on Zosyn on admission. Plan for tentative discharge today. Transitioned to po Cefadroxil  today for 10 days.  Will place referral for outpatient urology consult per AHF team.  Vital signs: Temp: 97.8 HR: 73 Doppler Pressure: 80 Auto Cuff BP: 97/75 (83) O2 Sat: 96% on RA Wt: 185.2>185.2>171.74 lbs    LVAD interrogation reveals:  Speed: 5300 Flow: 4.6 Power: 3.9 w PI: 3.3 Hematocrit: 32  Alarms: none Events: rare  Fixed speed: 5300 Low speed limit: 5000  Drive Line:  Weekly dressing changes per VAD coordinator or pts wife. Next dressing change due: 07/04/24 by caregiver. Discussed dressing change with wife at bedside she plans to change dressing when they get home.   Labs:  Hgb trend: 10.4>9.8  LDH trend: 126>117  INR trend: 2.2>2.5  WBC: 11>6.3>4.0  Anticoagulation Plan: -INR Goal: 2-2.5   Blood Products:  Device: - Cabin crew - V paced 60 -Therapies: OFF  Infection:   Plan/Recommendations:  Page VAD coordinator for drive line or equipment concerns 2. Weekly dressing changes per VAD Coordinator or caregiver.  Schuyler Lunger RN, BSN VAD Coordinator 24/7 Pager [phone]

## 2024-07-06 ENCOUNTER — Ambulatory Visit: Admitting: Speech Pathology

## 2024-07-06 ENCOUNTER — Encounter

## 2024-07-06 ENCOUNTER — Ambulatory Visit: Admitting: Physical Therapy

## 2024-07-06 ENCOUNTER — Encounter (HOSPITAL_COMMUNITY)

## 2024-07-06 LAB — CULTURE, BLOOD (ROUTINE X 2)
Culture: NO GROWTH
Special Requests: ADEQUATE

## 2024-07-07 ENCOUNTER — Ambulatory Visit (HOSPITAL_COMMUNITY): Payer: Self-pay | Admitting: Pharmacist

## 2024-07-07 LAB — POCT INR: INR: 2.4 (ref 2.0–3.0)

## 2024-07-08 ENCOUNTER — Other Ambulatory Visit (HOSPITAL_COMMUNITY): Payer: Self-pay | Admitting: Cardiology

## 2024-07-10 ENCOUNTER — Other Ambulatory Visit (HOSPITAL_COMMUNITY): Payer: Self-pay | Admitting: Cardiology

## 2024-07-10 DIAGNOSIS — Z95811 Presence of heart assist device: Secondary | ICD-10-CM

## 2024-07-10 DIAGNOSIS — I5022 Chronic systolic (congestive) heart failure: Secondary | ICD-10-CM

## 2024-07-11 ENCOUNTER — Ambulatory Visit: Admitting: Speech Pathology

## 2024-07-11 ENCOUNTER — Encounter

## 2024-07-11 ENCOUNTER — Other Ambulatory Visit (HOSPITAL_COMMUNITY): Payer: Self-pay

## 2024-07-11 ENCOUNTER — Ambulatory Visit: Admitting: Physical Therapy

## 2024-07-11 DIAGNOSIS — Z7901 Long term (current) use of anticoagulants: Secondary | ICD-10-CM

## 2024-07-11 DIAGNOSIS — Z95811 Presence of heart assist device: Secondary | ICD-10-CM

## 2024-07-12 ENCOUNTER — Ambulatory Visit (HOSPITAL_COMMUNITY): Payer: Self-pay | Admitting: Cardiology

## 2024-07-12 ENCOUNTER — Ambulatory Visit (HOSPITAL_COMMUNITY)
Admission: RE | Admit: 2024-07-12 | Discharge: 2024-07-12 | Disposition: A | Discharge status: Home / Self Care | Source: Ambulatory Visit | Attending: Cardiology | Admitting: Cardiology

## 2024-07-12 DIAGNOSIS — Z955 Presence of coronary angioplasty implant and graft: Secondary | ICD-10-CM | POA: Diagnosis not present

## 2024-07-12 DIAGNOSIS — Z79899 Other long term (current) drug therapy: Secondary | ICD-10-CM | POA: Diagnosis not present

## 2024-07-12 DIAGNOSIS — Z95811 Presence of heart assist device: Secondary | ICD-10-CM | POA: Insufficient documentation

## 2024-07-12 DIAGNOSIS — Z7901 Long term (current) use of anticoagulants: Secondary | ICD-10-CM | POA: Insufficient documentation

## 2024-07-12 DIAGNOSIS — N186 End stage renal disease: Secondary | ICD-10-CM | POA: Diagnosis not present

## 2024-07-12 DIAGNOSIS — K922 Gastrointestinal hemorrhage, unspecified: Secondary | ICD-10-CM | POA: Diagnosis present

## 2024-07-12 DIAGNOSIS — I071 Rheumatic tricuspid insufficiency: Secondary | ICD-10-CM | POA: Insufficient documentation

## 2024-07-12 DIAGNOSIS — N329 Bladder disorder, unspecified: Secondary | ICD-10-CM | POA: Insufficient documentation

## 2024-07-12 DIAGNOSIS — I472 Ventricular tachycardia, unspecified: Secondary | ICD-10-CM | POA: Diagnosis not present

## 2024-07-12 DIAGNOSIS — R935 Abnormal findings on diagnostic imaging of other abdominal regions, including retroperitoneum: Secondary | ICD-10-CM | POA: Diagnosis not present

## 2024-07-12 DIAGNOSIS — I2510 Atherosclerotic heart disease of native coronary artery without angina pectoris: Secondary | ICD-10-CM | POA: Diagnosis not present

## 2024-07-12 DIAGNOSIS — K5521 Angiodysplasia of colon with hemorrhage: Secondary | ICD-10-CM | POA: Diagnosis not present

## 2024-07-12 DIAGNOSIS — I519 Heart disease, unspecified: Secondary | ICD-10-CM | POA: Diagnosis not present

## 2024-07-12 DIAGNOSIS — N390 Urinary tract infection, site not specified: Secondary | ICD-10-CM | POA: Insufficient documentation

## 2024-07-12 DIAGNOSIS — I9589 Other hypotension: Secondary | ICD-10-CM | POA: Insufficient documentation

## 2024-07-12 DIAGNOSIS — N179 Acute kidney failure, unspecified: Secondary | ICD-10-CM | POA: Diagnosis not present

## 2024-07-12 DIAGNOSIS — I480 Paroxysmal atrial fibrillation: Secondary | ICD-10-CM | POA: Insufficient documentation

## 2024-07-12 DIAGNOSIS — I429 Cardiomyopathy, unspecified: Secondary | ICD-10-CM | POA: Insufficient documentation

## 2024-07-12 DIAGNOSIS — I6622 Occlusion and stenosis of left posterior cerebral artery: Secondary | ICD-10-CM | POA: Diagnosis not present

## 2024-07-12 DIAGNOSIS — R946 Abnormal results of thyroid function studies: Secondary | ICD-10-CM | POA: Diagnosis not present

## 2024-07-12 DIAGNOSIS — Z8673 Personal history of transient ischemic attack (TIA), and cerebral infarction without residual deficits: Secondary | ICD-10-CM | POA: Insufficient documentation

## 2024-07-12 DIAGNOSIS — I5022 Chronic systolic (congestive) heart failure: Secondary | ICD-10-CM | POA: Diagnosis not present

## 2024-07-12 DIAGNOSIS — R570 Cardiogenic shock: Secondary | ICD-10-CM | POA: Insufficient documentation

## 2024-07-12 DIAGNOSIS — I132 Hypertensive heart and chronic kidney disease with heart failure and with stage 5 chronic kidney disease, or end stage renal disease: Secondary | ICD-10-CM | POA: Insufficient documentation

## 2024-07-12 LAB — BASIC METABOLIC PANEL WITH GFR
Anion gap: 3 — ABNORMAL LOW (ref 5–15)
BUN: 16 mg/dL (ref 8–23)
CO2: 26 mmol/L (ref 22–32)
Calcium: 8.6 mg/dL — ABNORMAL LOW (ref 8.9–10.3)
Chloride: 107 mmol/L (ref 98–111)
Creatinine, Ser: 1.69 mg/dL — ABNORMAL HIGH (ref 0.61–1.24)
GFR, Estimated: 43 mL/min — ABNORMAL LOW (ref >60)
Glucose, Bld: 92 mg/dL (ref 70–99)
Potassium: 2.8 mmol/L — ABNORMAL LOW (ref 3.5–5.1)
Sodium: 136 mmol/L (ref 135–145)

## 2024-07-12 LAB — LACTATE DEHYDROGENASE: LDH: 140 U/L (ref 98–192)

## 2024-07-12 LAB — PROTIME-INR
INR: 2.2 — ABNORMAL HIGH (ref 0.8–1.2)
Prothrombin Time: 25.5 s — ABNORMAL HIGH (ref 11.4–15.2)

## 2024-07-12 LAB — CBC
HCT: 33 % — ABNORMAL LOW (ref 39.0–52.0)
Hemoglobin: 10.1 g/dL — ABNORMAL LOW (ref 13.0–17.0)
MCH: 24.9 pg — ABNORMAL LOW (ref 26.0–34.0)
MCHC: 30.6 g/dL (ref 30.0–36.0)
MCV: 81.5 fL (ref 80.0–100.0)
Platelets: 329 K/uL (ref 150–400)
RBC: 4.05 MIL/uL — ABNORMAL LOW (ref 4.22–5.81)
RDW: 19.4 % — ABNORMAL HIGH (ref 11.5–15.5)
WBC: 3.3 K/uL — ABNORMAL LOW (ref 4.0–10.5)
nRBC: 0 % (ref 0.0–0.2)

## 2024-07-12 LAB — POCT INR: INR: 2.2 (ref 2.0–3.0)

## 2024-07-12 MED ORDER — POTASSIUM CHLORIDE CRYS ER 10 MEQ PO TBCR: 40.0000 meq | EXTENDED_RELEASE_TABLET | Freq: Every day | ORAL | 3 refills | Status: DC

## 2024-07-12 NOTE — Progress Notes (Addendum)
 Patient presents for hospital discharge follow up with his wife Isaac [name]. Denies issues/concerns with VAD equipment or drive line.    Patient ambulated in to clinic independently without difficulty. Denies lightheadedness, dizziness, falls, shortness of breath, and signs of bleeding.   Pt recently admitted to the hospital due to UTI with positive blood cultures for E Coli. Discharged on Cefadroxil  1g BID for 10 days. End date 07/14/24. Pt complains of diarrhea since starting antibiotic. Previously advised he may take OTC Immodium however pt has yet to do this. MD advised that symptoms should resolve after the completion of antibiotics. Pt may take a a probiotic once antibiotics are completed. Referral sent to Urology at time of discharge. Appt scheduled 07/21/24.  Pt states he is not taking Potassium or Magnesium  as prescribed. Taking all other medications as prescribed. Continues Lasix  to 60 mg daily. Will review labs from today   Provided with cardiac clearance letter for pt to exercise with a personal trainer at the local [employer] per Dr Rolan last visit.  Requesting referral to be placed for Cardiac Rehab.  Vital Signs:  Doppler Pressure: 104 Automatic BP: 102/76 (84) HR: 61 SPO2: 98%   Weight: 182.8 lb w/ eqt Discharge weight: 171 Last Clinic weight: 191 lb w/eqt   VAD Indication: Destination Therapy due to age and kidney disease   LVAD assessment:  HM III: Speed: 5300 rpms Flow: 4.5 Power: 3.9 w    PI: 2.6 Alarms: none Events:  5 - 10 PI events daily  Fixed speed: 5300 Low speed limit: 5000  Primary controller:  Back up battery due for replacement in 33 months Secondary controller: Back up battery due for replacement in 33 months    I reviewed the LVAD parameters from today and compared the results to the patient's prior recorded data. LVAD interrogation was NEGATIVE for significant power changes, NEGATIVE for clinical alarms and STABLE for PI events/speed drops. No programming  changes were made and pump is functioning within specified parameters. Pt is performing daily controller and system monitor self tests along with completing weekly and monthly maintenance for LVAD equipment.   LVAD equipment check completed and is in good working order. Back-up equipment present.   Annual Maintenance completed: 10/07/2022  Exit Site Care: Wife has been performing weekly dressing changes using weekly dressings. Drive line anchor correctly applied.  Pt given 4 weekly kits and a box of 2 x 2 for home use.  Device:  [employer] dual  Therapies: on Pacing: VVI 50 Last check: 05/12/24   BP & Labs:  Doppler 104 - Doppler is reflecting modified systolic   Hgb 10.1- No S/S of bleeding. Specifically denies melena/BRBPR or nosebleeds.   LDH pending with established baseline of 200 - 300. Denies tea-colored urine. No power elevations noted on interrogation.   Addendum: K+ 2.8 pt instructed he must consistently take prescribed dose 40meq of Potassium (4 tablets) daily  Patient Instructions:  No medication changes Return in 2 months for full visit with Dr. Rolan Referral placed to Cardiac Rehab  Isaac Lunger RN, BSN VAD Coordinator 24/7 Pager [phone]

## 2024-07-12 NOTE — Progress Notes (Signed)
 Cardiology: Dr. Rolan PCP: Valora Lynwood FALCON, MD  Chief complaint: CHF  Follow up for Heart Failure/LVAD: 73 y.o. with history of CAD, HTN, atrial fibrillation, and chronic systolic CHF returns for followup of CHF/LVAD.   Patient has had a long-standing cardiomyopathy, EF had been in the 25% range for years.  He had PCI to OM1 in 2007 and RCA in 2013.  From his report, these episodes do not sound like ACS events.  Echo in 12/22 showed EF < 20%, severe LV dilation, restrictive diastolic function, moderate RV dysfunction, moderate MR, mod-severe TR. HF was been complicated by CKD stage 3. He was also noted on device interrogation to have been in atrial fibrillation persistently since 10/22.  He was admitted at [facility] in 12/22.  BP was low, his BP-active meds were stopped and midodrine  was begun.  He was diuresed and discharged home.    He reports ongoing severe dyspnea and was admitted again in 12/22 after he stood up then passed out at home.  Device interrogation showed run of VT treated x 2 with ATP possibly around the time of syncope.  RHC in 12/22 showed low output HF and he developed cardiogenic shock. Impella 5.5 was placed.  On 09/17/21, HM3 LVAD was placed, ICD leads were plastered to the tricuspid valve with severe TR, the valve was not replaced.  The patient had significant interoperative hypotension.  Post-operatively, he developed progressive renal failure and was started on CVVH then iHD.  He had a bumpy post-op course with renal failure and RV failure, but was ultimately able to discharge home on 10/24/21.   Ramp echo in 3/23, RV mildly dilated with normal systolic function, severe TR, LV EF 20-25%, aortic valve opens every beat.  Speed increased to 5300 rpm. The IV septum was mildly leftwards at 5300 rpm but flow increased and AoV still opened every beat.   Patient was admitted in 3/24 with left groin abscess.  This spontaneously drained and was treated with IV antibiotics.  He was sent  home on linezolid  and Augmentin .  He developed profuse diarrhea that he attributed to linezolid .  Linezolid  was stopped and he was continued on Augmentin .   Echo in 7/24 showed EF < 20%, IV septum midline to slightly leftward, RV moderately dilated with moderately decreased systolic function, IVC dilated with severe TR.  Speed not changed.   He was admitted with dysarthria and right-sided weakness in 5/25.  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 Echo this admission showed EF 20-25%, mild LVH, LVAD present, mild RV dysfunction, moderate AI, mild RV dysfunction with normal RV size, severe TR.  INR goal increased to 2.5-3. Dual chamber ICD generator was also changed this admission, needs pacing at times with bradycardia.   Patient was admitted 03/21/24 with GI bleeding/melena, he received a total of 7 units PRBCs.  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  INR goal was decreased to 2-2.5.   Patient was admitted in 10/25 with acute cystitis.  Renal ultrasound showed a polypoid lesion along the mucosal surface of the posterior bladder wall.   Patient returns for LVAD followup with his wife.  He is feeling good, no significant dyspnea with usual activities.  Able to walk up stairs without dyspnea.  No lightheadedness.  No BRBPR/melena. He has not been taking KCl regularly.   LVAD interrogation (personally reviewed): LVAD Documentation    07/12/2024  Device Info  LVAD Type: Heartmate III  Date of Implant: 09/17/2021  Therapy Type: Destination Therapy      07/12/2024  Vitals  Heart Rate: 61 BPM  Automatic BP: 102/76  Doppler MAP: 104 mmHg  SpO2: 98 %    Last 3 Weights Weight Weight  07/12/2024 82.918 kg 182 lb 12.8 oz  07/04/2024 77.9 kg 171 lb 11.8 oz  07/03/2024 84 kg 185 lb 3 oz       07/12/2024  LVAD Paramaters  Speed: 5300 RPM  Flow: 5 LPM  PI: 3  Power: 4 Watts  Hematocrit: 32 %   Alarms: none  Events: 5 - 10 daily  Last Speed Change Date: 12/09/2021  Last Ramp Echo Date: 12/09/2021  Last Right Heart Cath Date: 09/08/2021  Bleeding History: Yes  Type of Bleeding Hx: GI Bleed  Type of Dressing: Weekly  Annual Maintenance Date: 10/07/2022    Labs    Units 07/12/24 1025 07/07/24 0000 07/04/24 0400 07/03/24 0232  INR  2.2* 2.4 2.5* 2.2*  LDH U/L 140  --  117 126  HGB g/dL 89.8*  --  9.8* 89.5*  CREATININE mg/dL 8.30*  --  7.79* 7.98*         Labs (3/24): hgb 10.7, LDH 157, LFTs normal, creatinine 2.75 Labs (5/24): K 3.6, creatinine 2.7, LFTs normal, TSH normal Labs (7/24): hgb 11.7, LDH 172, K 3.5, creatinine 2.33 Labs (10/24): K 3.4, creatinine 2.24 Labs (12/24): K 4, creatinine 2.37 Labs (2/25): TSH normal Labs (3/25): hgb 12.5, K 4.9, creatinine 2.57 Labs (4/25): creatinine 2.8 Labs (5/25): K 4, creatinine 1.97, LDH 163, hgb 12.3 Labs (6/25): K 4.3, creatinine 2.24, LDH 175, hgb 11.9, plts 220 Labs (7/25): K 4.2, creatinine 1.9 => 2.33, hgb 8.8 => 9.6, Transferrin saturation 7%.  Labs (10/25): K 3.9, creatinine 2.2, hgb 9.8  PMH: 1. HTN 2. Hyperlipidemia 3. CAD: H/o PCI to OM1 2007 and PCI to RCA in 2013.  Uncertain of circumstances but do not sound like ACS events.  4. CKD stage IV: Patient was on HD for several months post-LVAD but was able to come off.  5. Atrial fibrillation: Paroxysmal 6. Chronic systolic CHF: Long-standing cardiomyopathy with EF in the 25% range.  Cause uncertain, has history of PCI to RCA and OM1, but degree of CAD does not seem to explain severity of cardiomyopathy. Father had history of CAD/CHF.  Not heavy drinker. Has [employer] dual chamber ICD.  - Echo (12/22): EF < 20%, severe LV dilation, restrictive diastolic function, moderate RV dysfunction, moderate MR, mod-severe TR.  - 12/22 cardiogenic shock with Impella 5.5 placement then Heartmate 3 LVAD.  This was complicated by intra-op hypotension with AKI => ESRD and  RV failure.  - Echo (7/24): EF < 20%, IV septum midline to slightly leftward, RV moderately dilated with moderately decreased systolic function, IVC dilated with severe TR. - Echo (5/25): EF 20-25%, mild LVH, LVAD present, mild RV dysfunction, moderate AI, mild RV dysfunction with normal RV size, severe TR.  7. VT - Nausea with amiodarone .  8. RV failure/severe TR 9. Citrobacter UTI (8/23) 10. Left groin abscess 3/24 11. 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  12. GI bleeding: 7/25, multiple AVMs treated with APC.  13. UTIs   Current Outpatient Medications  Medication Sig Dispense Refill   acetaminophen  (TYLENOL ) 325 MG tablet Take 2 tablets (650 mg total) by mouth every 4 (four) hours as needed for headache or mild pain (pain score 1-3).  allopurinol  (ZYLOPRIM ) 100 MG tablet TAKE 1/2 TABLET BY MOUTH DAILY 45 tablet 3   cefadroxil  (DURICEF) 500 MG capsule Take 2 capsules (1,000 mg total) by mouth 2 (two) times daily. 40 capsule 0   furosemide  (LASIX ) 20 MG tablet Take 3 tablets (60 mg total) by mouth daily. 90 tablet 11   magnesium  oxide (MAG-OX) 400 (240 Mg) MG tablet Take 1 tablet (400 mg total) by mouth daily. (Patient taking differently: Take 400 mg by mouth as needed.) 30 tablet 0   Multiple Vitamins-Minerals (ONE A DAY MEN 50 PLUS PO) Take 1 tablet by mouth daily.     pantoprazole  (PROTONIX ) 40 MG tablet Take twice a day for 1 month and then can decrease back to once daily. 60 tablet 1   rosuvastatin  (CRESTOR ) 10 MG tablet Take 1 tablet (10 mg total) by mouth at bedtime. 30 tablet 0   sildenafil  (REVATIO ) 20 MG tablet TAKE 1 TABLET BY MOUTH THREE TIMES A DAY 270 tablet 3   warfarin (COUMADIN ) 2 MG tablet Take 1 tablet (2 mg total) by mouth daily.     potassium chloride  (KLOR-CON  M) 10 MEQ tablet Take 4 tablets (40 mEq total) by mouth daily. 180 tablet 3   No current facility-administered medications for this encounter.    Neosporin [neomycin-bacitracin  zn-polymyx], Doxycycline , Mushroom extract complex (obsolete), and Tape  REVIEW OF SYSTEMS: All systems negative except as listed in HPI, PMH and Problem list.  I reviewed the LVAD parameters from today, and compared the results to the patient's prior recorded data.  No programming changes were made.  The LVAD is functioning within specified parameters.  The patient performs LVAD self-test daily.  LVAD interrogation was negative for any significant power changes, alarms or PI events/speed drops.  LVAD equipment check completed and is in good working order.  Back-up equipment present.   LVAD education done on emergency procedures and precautions and reviewed exit site care.    Vitals:   07/12/24 1105 07/12/24 1106  BP: 102/76 (!) 104/0  Pulse: 61   SpO2: 98%   Weight: 82.9 kg (182 lb 12.8 oz)   MAP 84  Physical Exam: General: Well appearing this am. NAD.  HEENT: Normal. Neck: Supple, JVP 8 cm. Carotids OK.  Cardiac:  Mechanical heart sounds with LVAD hum present.  Lungs:  CTAB, normal effort.  Abdomen:  NT, ND, no HSM. No bruits or masses. +BS  LVAD exit site: Well-healed and incorporated. Dressing dry and intact. No erythema or drainage. Stabilization device present and accurately applied. Driveline dressing changed daily per sterile technique. Extremities:  Warm and dry. No cyanosis, clubbing, rash. 1+ ankle edema.  Neuro:  Alert & oriented x 3. Cranial nerves grossly intact. Moves all 4 extremities w/o difficulty. Affect pleasant    ASSESSMENT AND PLAN: 1. 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  No evidence for overt bleeding.  - CBC today.  - Continue Protonix  40 mg daily.  - I recommended that he start monthly octreotide  infusions to decrease risk for recurrent AVM bleeding.  He does not want to start this yet but is willing to start it if he has recurrent bleeding.  2. Chronic systolic CHF:  Long-standing cardiomyopathy.  [employer] dual chamber ICD.   Echo in 12/22 with EF < 20%, severe LV dilation, restrictive diastolic function, moderate RV dysfunction, moderate MR, mod-severe TR. Cause of cardiomyopathy is uncertain.  He has a history of CAD, but I do not think that  the described CAD from the past could explain his cardiomyopathy, but CAD could have progressed.  He was admitted in 12/22 with cardiogenic shock and Impella 5.5. s/p HM III VAD on 09/17/22.  Post-op course complicated by AKI and RV failure. He was on HD for several months but is now off.  Echo in 7/24 showed EF < 20%, IV septum midline to slightly leftward, RV moderately dilated with moderately decreased systolic function, IVC dilated with severe TR.  Echo in 5/25 with EF 20-25%, mild LVH, LVAD present, mild RV dysfunction, moderate AI, mild RV dysfunction with normal RV size, severe TR.  NYHA class II, volume status looks ok.  LVAD parameters reviewed today and stable, no alarms.  - Continue Lasix  60 mg daily. BMET today.  Suspect he will need to restart KCl.   - Continue sildenafil  20 mg tid.  - Continue warfarin, INR goal decreased to 2-2.5 with GI bleeding.  3. Tricuspid regurgitation:  Tricuspid repair not done at time of VAD due to proximity of ICD wires and hypotension during surgery. He has severe TR.  4. Atrial fibrillation: Paroxysmal.  He is off amiodarone  with resolution of nausea.  I will not restart it.  He has been a-paced recently.  5. CKD stage IV: Suspect intra-op hypotension at the time of LVAD placement led to development of ATN => urine sediment looked like ATN per renal. Had initial CVVH and transitioned over to HD. He is now off HD.  - BMET today.  6. CAD: History of PCI to OM1 in 2007 and RCA in 2013.  No CP or ACs.  - Deferred cardiac cath in 12/22 due to AKI and plan for VAD - Continue Crestor .   7. Abnormal thyroid  function tests: Patient has had elevated TSH and free T4, mixed pattern. We have stopped amiodarone . 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  Most  recent TSH was normal.  - He follows with endocrinology.   8. Nausea: This seems to have been due to amiodarone , resolved off amiodarone .   9. 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  INR goal increased to 2.5-3 but then decreased back to 2-2.5 with GI bleeding.  10. UTI: Patient is completing cefadroxil  for UTI.  Abnormal renal ultrasound with bladder lesion noted.  He has followup with urology scheduled.   Followup in 2 months.  I spent 42 minutes reviewing records, interviewing/examining patient, and managing orders.    Ezra Shuck 07/12/2024

## 2024-07-12 NOTE — Patient Instructions (Addendum)
 No medication changes Return in 2 months for full visit with Dr. Rolan Referral placed to Cardiac Rehab

## 2024-07-13 ENCOUNTER — Encounter

## 2024-07-13 ENCOUNTER — Ambulatory Visit (HOSPITAL_COMMUNITY): Payer: Self-pay | Admitting: Pharmacist

## 2024-07-13 ENCOUNTER — Ambulatory Visit: Admitting: Physical Therapy

## 2024-07-13 ENCOUNTER — Ambulatory Visit: Admitting: Speech Pathology

## 2024-07-14 ENCOUNTER — Other Ambulatory Visit (HOSPITAL_COMMUNITY): Payer: Self-pay | Admitting: *Deleted

## 2024-07-14 DIAGNOSIS — Z7901 Long term (current) use of anticoagulants: Secondary | ICD-10-CM

## 2024-07-14 DIAGNOSIS — Z95811 Presence of heart assist device: Secondary | ICD-10-CM

## 2024-07-17 ENCOUNTER — Ambulatory Visit (HOSPITAL_COMMUNITY): Payer: Self-pay | Admitting: Pharmacist

## 2024-07-17 LAB — POCT INR: INR: 2 (ref 2.0–3.0)

## 2024-07-18 ENCOUNTER — Encounter (HOSPITAL_COMMUNITY): Payer: Self-pay

## 2024-07-18 ENCOUNTER — Ambulatory Visit (HOSPITAL_COMMUNITY)
Admission: RE | Admit: 2024-07-18 | Discharge: 2024-07-18 | Disposition: A | Discharge status: Home / Self Care | Source: Ambulatory Visit | Attending: Cardiology | Admitting: Cardiology

## 2024-07-18 ENCOUNTER — Inpatient Hospital Stay (HOSPITAL_COMMUNITY)
Admission: RE | Admit: 2024-07-18 | Discharge: 2024-07-25 | DRG: 982 | Disposition: A | Discharge status: Home Health | Source: Ambulatory Visit | Attending: Cardiology | Admitting: Cardiology

## 2024-07-18 ENCOUNTER — Other Ambulatory Visit (HOSPITAL_COMMUNITY): Payer: Self-pay | Admitting: Unknown Physician Specialty

## 2024-07-18 ENCOUNTER — Ambulatory Visit: Admitting: Physical Therapy

## 2024-07-18 ENCOUNTER — Other Ambulatory Visit: Payer: Self-pay

## 2024-07-18 ENCOUNTER — Inpatient Hospital Stay (HOSPITAL_COMMUNITY)

## 2024-07-18 ENCOUNTER — Encounter: Admitting: Speech Pathology

## 2024-07-18 ENCOUNTER — Encounter

## 2024-07-18 VITALS — BP 92/66 | HR 86 | Temp 102.0°F | Wt 178.7 lb

## 2024-07-18 DIAGNOSIS — Z8249 Family history of ischemic heart disease and other diseases of the circulatory system: Secondary | ICD-10-CM | POA: Diagnosis not present

## 2024-07-18 DIAGNOSIS — Z95811 Presence of heart assist device: Secondary | ICD-10-CM

## 2024-07-18 DIAGNOSIS — Z8673 Personal history of transient ischemic attack (TIA), and cerebral infarction without residual deficits: Secondary | ICD-10-CM | POA: Diagnosis not present

## 2024-07-18 DIAGNOSIS — R7881 Bacteremia: Secondary | ICD-10-CM

## 2024-07-18 DIAGNOSIS — K824 Cholesterolosis of gallbladder: Secondary | ICD-10-CM | POA: Diagnosis present

## 2024-07-18 DIAGNOSIS — I480 Paroxysmal atrial fibrillation: Secondary | ICD-10-CM | POA: Diagnosis present

## 2024-07-18 DIAGNOSIS — R410 Disorientation, unspecified: Secondary | ICD-10-CM | POA: Diagnosis not present

## 2024-07-18 DIAGNOSIS — Z79899 Other long term (current) drug therapy: Secondary | ICD-10-CM

## 2024-07-18 DIAGNOSIS — I2510 Atherosclerotic heart disease of native coronary artery without angina pectoris: Secondary | ICD-10-CM | POA: Diagnosis present

## 2024-07-18 DIAGNOSIS — I071 Rheumatic tricuspid insufficiency: Secondary | ICD-10-CM | POA: Diagnosis present

## 2024-07-18 DIAGNOSIS — K828 Other specified diseases of gallbladder: Secondary | ICD-10-CM | POA: Diagnosis present

## 2024-07-18 DIAGNOSIS — R509 Fever, unspecified: Principal | ICD-10-CM | POA: Diagnosis present

## 2024-07-18 DIAGNOSIS — I130 Hypertensive heart and chronic kidney disease with heart failure and stage 1 through stage 4 chronic kidney disease, or unspecified chronic kidney disease: Secondary | ICD-10-CM | POA: Diagnosis present

## 2024-07-18 DIAGNOSIS — Z87891 Personal history of nicotine dependence: Secondary | ICD-10-CM

## 2024-07-18 DIAGNOSIS — N184 Chronic kidney disease, stage 4 (severe): Secondary | ICD-10-CM | POA: Diagnosis present

## 2024-07-18 DIAGNOSIS — Z823 Family history of stroke: Secondary | ICD-10-CM

## 2024-07-18 DIAGNOSIS — I43 Cardiomyopathy in diseases classified elsewhere: Secondary | ICD-10-CM | POA: Diagnosis present

## 2024-07-18 DIAGNOSIS — A0472 Enterocolitis due to Clostridium difficile, not specified as recurrent: Secondary | ICD-10-CM | POA: Diagnosis present

## 2024-07-18 DIAGNOSIS — Z95810 Presence of automatic (implantable) cardiac defibrillator: Secondary | ICD-10-CM | POA: Diagnosis not present

## 2024-07-18 DIAGNOSIS — Z91048 Other nonmedicinal substance allergy status: Secondary | ICD-10-CM

## 2024-07-18 DIAGNOSIS — I252 Old myocardial infarction: Secondary | ICD-10-CM

## 2024-07-18 DIAGNOSIS — Z9861 Coronary angioplasty status: Secondary | ICD-10-CM

## 2024-07-18 DIAGNOSIS — Z7901 Long term (current) use of anticoagulants: Secondary | ICD-10-CM

## 2024-07-18 DIAGNOSIS — R1114 Bilious vomiting: Secondary | ICD-10-CM

## 2024-07-18 DIAGNOSIS — Z888 Allergy status to other drugs, medicaments and biological substances status: Secondary | ICD-10-CM | POA: Diagnosis not present

## 2024-07-18 DIAGNOSIS — I5022 Chronic systolic (congestive) heart failure: Secondary | ICD-10-CM | POA: Diagnosis present

## 2024-07-18 DIAGNOSIS — I493 Ventricular premature depolarization: Secondary | ICD-10-CM | POA: Diagnosis present

## 2024-07-18 DIAGNOSIS — R946 Abnormal results of thyroid function studies: Secondary | ICD-10-CM | POA: Diagnosis present

## 2024-07-18 DIAGNOSIS — Z83438 Family history of other disorder of lipoprotein metabolism and other lipidemia: Secondary | ICD-10-CM

## 2024-07-18 DIAGNOSIS — E785 Hyperlipidemia, unspecified: Secondary | ICD-10-CM | POA: Diagnosis present

## 2024-07-18 LAB — URINALYSIS, COMPLETE (UACMP) WITH MICROSCOPIC
Bacteria, UA: NONE SEEN
Bilirubin Urine: NEGATIVE
Glucose, UA: NEGATIVE mg/dL
Hgb urine dipstick: NEGATIVE
Ketones, ur: NEGATIVE mg/dL
Leukocytes,Ua: NEGATIVE
Nitrite: NEGATIVE
Protein, ur: 100 mg/dL — AB
Specific Gravity, Urine: 1.008 (ref 1.005–1.030)
pH: 6 (ref 5.0–8.0)

## 2024-07-18 LAB — COMPREHENSIVE METABOLIC PANEL WITH GFR
ALT: 18 U/L (ref 0–44)
AST: 26 U/L (ref 15–41)
Albumin: 2.5 g/dL — ABNORMAL LOW (ref 3.5–5.0)
Alkaline Phosphatase: 141 U/L — ABNORMAL HIGH (ref 38–126)
Anion gap: 9 (ref 5–15)
BUN: 19 mg/dL (ref 8–23)
CO2: 23 mmol/L (ref 22–32)
Calcium: 8.8 mg/dL — ABNORMAL LOW (ref 8.9–10.3)
Chloride: 105 mmol/L (ref 98–111)
Creatinine, Ser: 1.83 mg/dL — ABNORMAL HIGH (ref 0.61–1.24)
GFR, Estimated: 39 mL/min — ABNORMAL LOW (ref >60)
Glucose, Bld: 92 mg/dL (ref 70–99)
Potassium: 3.6 mmol/L (ref 3.5–5.1)
Sodium: 137 mmol/L (ref 135–145)
Total Bilirubin: 1.6 mg/dL — ABNORMAL HIGH (ref 0.0–1.2)
Total Protein: 5.7 g/dL — ABNORMAL LOW (ref 6.5–8.1)

## 2024-07-18 LAB — MRSA NEXT GEN BY PCR, NASAL: MRSA by PCR Next Gen: NOT DETECTED

## 2024-07-18 LAB — CBC
HCT: 34.8 % — ABNORMAL LOW (ref 39.0–52.0)
Hemoglobin: 10.8 g/dL — ABNORMAL LOW (ref 13.0–17.0)
MCH: 25.1 pg — ABNORMAL LOW (ref 26.0–34.0)
MCHC: 31 g/dL (ref 30.0–36.0)
MCV: 80.9 fL (ref 80.0–100.0)
Platelets: 274 K/uL (ref 150–400)
RBC: 4.3 MIL/uL (ref 4.22–5.81)
RDW: 20 % — ABNORMAL HIGH (ref 11.5–15.5)
WBC: 9.1 K/uL (ref 4.0–10.5)
nRBC: 0 % (ref 0.0–0.2)

## 2024-07-18 LAB — LACTIC ACID, PLASMA: Lactic Acid, Venous: 1.7 mmol/L (ref 0.5–1.9)

## 2024-07-18 LAB — C-REACTIVE PROTEIN: CRP: 1.8 mg/dL — ABNORMAL HIGH (ref <1.0)

## 2024-07-18 LAB — LACTATE DEHYDROGENASE: LDH: 159 U/L (ref 98–192)

## 2024-07-18 LAB — C DIFFICILE QUICK SCREEN W PCR REFLEX
C Diff antigen: POSITIVE — AB
C Diff interpretation: DETECTED
C Diff toxin: POSITIVE — AB

## 2024-07-18 LAB — SEDIMENTATION RATE: Sed Rate: 18 mm/h — ABNORMAL HIGH (ref 0–16)

## 2024-07-18 LAB — PROTIME-INR
INR: 1.5 — ABNORMAL HIGH (ref 0.8–1.2)
Prothrombin Time: 18.7 s — ABNORMAL HIGH (ref 11.4–15.2)

## 2024-07-18 LAB — PROCALCITONIN: Procalcitonin: <0.1 ng/mL

## 2024-07-18 MED ORDER — WARFARIN SODIUM 2 MG PO TABS
4.0000 mg | ORAL_TABLET | Freq: Once | ORAL | Status: AC
Start: 2024-07-18 — End: 2024-07-18
  Administered 2024-07-18: 4 mg via ORAL
  Filled 2024-07-18: qty 2

## 2024-07-18 MED ORDER — WARFARIN - PHARMACIST DOSING INPATIENT: Freq: Every day | Status: DC

## 2024-07-18 MED ORDER — VANCOMYCIN HCL IN DEXTROSE 1-5 GM/200ML-% IV SOLN: 1000.0000 mg | INTRAVENOUS | Status: DC

## 2024-07-18 MED ORDER — ONDANSETRON HCL 4 MG/2ML IJ SOLN: 4.0000 mg | Freq: Four times a day (QID) | INTRAMUSCULAR | Status: DC | PRN

## 2024-07-18 MED ORDER — VANCOMYCIN HCL 1250 MG/250ML IV SOLN
1250.0000 mg | Freq: Once | INTRAVENOUS | Status: AC
  Administered 2024-07-18: 1250 mg via INTRAVENOUS
  Filled 2024-07-18: qty 250

## 2024-07-18 MED ORDER — POTASSIUM CHLORIDE CRYS ER 20 MEQ PO TBCR
40.0000 meq | EXTENDED_RELEASE_TABLET | Freq: Once | ORAL | Status: AC
  Administered 2024-07-18: 40 meq via ORAL
  Filled 2024-07-18: qty 2

## 2024-07-18 MED ORDER — PANTOPRAZOLE SODIUM 40 MG PO TBEC
40.0000 mg | DELAYED_RELEASE_TABLET | Freq: Every day | ORAL | Status: DC
  Administered 2024-07-18 – 2024-07-19 (×2): 40 mg via ORAL
  Filled 2024-07-18 (×2): qty 1

## 2024-07-18 MED ORDER — ACETAMINOPHEN 325 MG PO TABS: 650.0000 mg | ORAL_TABLET | ORAL | Status: DC | PRN

## 2024-07-18 MED ORDER — ALLOPURINOL 100 MG PO TABS
50.0000 mg | ORAL_TABLET | Freq: Every day | ORAL | Status: DC
  Administered 2024-07-22 – 2024-07-25 (×4): 50 mg via ORAL
  Filled 2024-07-18 (×8): qty 1

## 2024-07-18 MED ORDER — LACTATED RINGERS IV SOLN: INTRAVENOUS | Status: AC

## 2024-07-18 MED ORDER — ONDANSETRON HCL 4 MG/2ML IJ SOLN
4.0000 mg | Freq: Once | INTRAMUSCULAR | Status: AC
  Administered 2024-07-18: 4 mg via INTRAVENOUS

## 2024-07-18 MED ORDER — SODIUM CHLORIDE 0.9 % IV SOLN
2.0000 g | Freq: Three times a day (TID) | INTRAVENOUS | Status: DC
Start: 2024-07-18 — End: 2024-07-19
  Administered 2024-07-18 (×2): 2 g via INTRAVENOUS
  Filled 2024-07-18 (×2): qty 12.5

## 2024-07-18 MED ORDER — ROSUVASTATIN CALCIUM 5 MG PO TABS
10.0000 mg | ORAL_TABLET | Freq: Every day | ORAL | Status: DC
  Administered 2024-07-18 – 2024-07-24 (×7): 10 mg via ORAL
  Filled 2024-07-18 (×7): qty 2

## 2024-07-18 NOTE — H&P (Addendum)
 Advanced Heart Failure VAD History and Physical Note   PCP-Cardiologist: Isaac Shuck, MD   Reason for Admission: Suspected Sepsis/Nausea/Vomiting/Fever  HPI:    Isaac [name] has had a long-standing cardiomyopathy, EF had been in the 25% range for years.  He had PCI to OM1 in 2007 and RCA in 2013.  From his report, these episodes do not sound like ACS events.  Echo in 12/22 showed EF < 20%, severe LV dilation, restrictive diastolic function, moderate RV dysfunction, moderate Isaac, mod-severe TR. HF was been complicated by CKD stage 3. He was also noted on device interrogation to have been in atrial fibrillation persistently since 10/22.  He was admitted at [facility] in 12/22.  BP was low, his BP-active meds were stopped and midodrine  was begun.  He was diuresed and discharged home.     He reports ongoing severe dyspnea and was admitted again in 12/22 after he stood up then passed out at home.  Device interrogation showed run of VT treated x 2 with ATP possibly around the time of syncope.  RHC in 12/22 showed low output HF and he developed cardiogenic shock. Impella 5.5 was placed.  On 09/17/21, HM3 LVAD was placed, ICD leads were plastered to the tricuspid valve with severe TR, the valve was not replaced.  The patient had significant interoperative hypotension.  Post-operatively, he developed progressive renal failure and was started on CVVH then iHD.  He had a bumpy post-op course with renal failure and RV failure, but was ultimately able to discharge home on 10/24/21.    Ramp echo in 3/23, RV mildly dilated with normal systolic function, severe TR, LV EF 20-25%, aortic valve opens every beat.  Speed increased to 5300 rpm. The IV septum was mildly leftwards at 5300 rpm but flow increased and AoV still opened every beat.    Patient was admitted in 3/24 with left groin abscess.  This spontaneously drained and was treated with IV antibiotics.  He was sent home on linezolid  and Augmentin .  He developed profuse  diarrhea that he attributed to linezolid .  Linezolid  was stopped and he was continued on Augmentin .    Echo in 7/24 showed EF < 20%, IV septum midline to slightly leftward, RV moderately dilated with moderately decreased systolic function, IVC dilated with severe TR.  Speed not changed.    He was admitted with dysarthria and right-sided weakness in 5/25.  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 Echo this admission showed EF 20-25%, mild LVH, LVAD present, mild RV dysfunction, moderate AI, mild RV dysfunction with normal RV size, severe TR.  INR goal increased to 2.5-3. Dual chamber ICD generator was also changed this admission, needs pacing at times with bradycardia.    Patient was admitted 03/21/24 with GI bleeding/melena, he received a total of 7 units PRBCs.  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  INR goal was decreased to 2-2.5.    Patient was admitted in 10/25 with acute cystitis.  Renal ultrasound showed a polypoid lesion along the mucosal surface of the posterior bladder wall.   Feeling poorly over the last few days. Completed antibiotics on 07/14/25. Having multiple loose stools a day. Today he developed fever, nausea, vomiting, and body aches. Having a hard time moving around. Denies bleeding issues.   LVAD INTERROGATION:  HeartMate II LVAD:  Flow 4.5 liters/min, speed 5300, power 3.8, PI 2.3.  Home Medications Prior to Admission medications   Medication Sig Start Date End Date Taking? Authorizing Provider  acetaminophen  (TYLENOL ) 325 MG tablet Take 2 tablets (650 mg total) by mouth every 4 (four) hours as needed for headache or mild pain (pain score 1-3). 02/18/24   Angiulli, Toribio PARAS, PA-C  allopurinol  (ZYLOPRIM ) 100 MG tablet TAKE 1/2 TABLET BY MOUTH DAILY 07/10/24   Tekoa Hamor S, MD  cefadroxil  (DURICEF) 500 MG capsule Take 2 capsules (1,000 mg total) by mouth 2 (two) times  daily. 07/04/24   Hayes Beckey CROME, NP  furosemide  (LASIX ) 20 MG tablet Take 3 tablets (60 mg total) by mouth daily. 05/30/24   Rolan Isaac RAMAN, MD  magnesium  oxide (MAG-OX) 400 (240 Mg) MG tablet Take 1 tablet (400 mg total) by mouth daily. Patient taking differently: Take 400 mg by mouth as needed. 02/18/24   Angiulli, Toribio PARAS, PA-C  Multiple Vitamins-Minerals (ONE A DAY MEN 50 PLUS PO) Take 1 tablet by mouth daily.    [provider]  pantoprazole  (PROTONIX ) 40 MG tablet Take twice a day for 1 month and then can decrease back to once daily. 04/02/24   Goodrich, Callie E, PA-C  potassium chloride  (KLOR-CON  M) 10 MEQ tablet Take 4 tablets (40 mEq total) by mouth daily. 07/12/24   Rolan Isaac RAMAN, MD  rosuvastatin  (CRESTOR ) 10 MG tablet Take 1 tablet (10 mg total) by mouth at bedtime. 02/18/24   Angiulli, Toribio PARAS, PA-C  sildenafil  (REVATIO ) 20 MG tablet TAKE 1 TABLET BY MOUTH THREE TIMES A DAY 07/11/24   Rolan Isaac RAMAN, MD  warfarin (COUMADIN ) 2 MG tablet Take 1 tablet (2 mg total) by mouth daily. 07/04/24   Hayes Beckey CROME, NP    Past Medical History: Past Medical History:  Diagnosis Date   Arrhythmia    atrial fibrillation   CHF (congestive heart failure) (HCC)    Chronic kidney disease    Coronary artery disease    Hyperlipidemia    Hypertension    Myocardial infarct [facility])     Past Surgical History: Past Surgical History:  Procedure Laterality Date   CARDIAC DEFIBRILLATOR PLACEMENT  feb 2014   COLONOSCOPY N/A 03/29/2024   Procedure: COLONOSCOPY;  Surgeon: Legrand Victory CROME DOUGLAS, MD;  Location: [facility] ENDOSCOPY;  Service: Gastroenterology;  Laterality: N/A;   ENTEROSCOPY N/A 03/22/2024   Procedure: ENTEROSCOPY;  Surgeon: Leigh Elspeth SQUIBB, MD;  Location: [facility] ENDOSCOPY;  Service: Gastroenterology;  Laterality: N/A;   ESOPHAGOGASTRODUODENOSCOPY N/A 03/29/2024   Procedure: EGD (ESOPHAGOGASTRODUODENOSCOPY);  Surgeon: Legrand Victory CROME DOUGLAS, MD;  Location: [facility] ENDOSCOPY;  Service: Gastroenterology;   Laterality: N/A;   ICD GENERATOR CHANGEOUT N/A 02/09/2024   Procedure: ICD GENERATOR CHANGEOUT;  Surgeon: Waddell Danelle ORN, MD;  Location: [facility] INVASIVE CV LAB;  Service: Cardiovascular;  Laterality: N/A;   INSERTION OF IMPLANTABLE LEFT VENTRICULAR ASSIST DEVICE N/A 09/17/2021   Procedure: INSERTION OF IMPLANTABLE LEFT VENTRICULAR ASSIST DEVICE AND INSERTION OF FEMORAL ARTERIAL LINE;  Surgeon: Lucas Dorise POUR, MD;  Location: [facility] OR;  Service: Open Heart Surgery;  Laterality: N/A;   IR FLUORO GUIDE CV LINE RIGHT  09/27/2021   IR THORACENTESIS ASP PLEURAL SPACE W/IMG GUIDE  09/27/2021   IR US  GUIDE VASC ACCESS RIGHT  09/27/2021   PLACEMENT OF IMPELLA LEFT VENTRICULAR ASSIST DEVICE N/A 09/12/2021   Procedure: PLACEMENT OF IMPELLA 5.5 LEFT VENTRICULAR ASSIST DEVICE;  Surgeon: Kerrin Elspeth BROCKS, MD;  Location: [facility] OR;  Service: Open Heart Surgery;  Laterality: N/A;   RIGHT HEART  CATH N/A 09/08/2021   Procedure: RIGHT HEART CATH;  Surgeon: Rolan Isaac RAMAN, MD;  Location: [facility] INVASIVE CV LAB;  Service: Cardiovascular;  Laterality: N/A;   TEE WITHOUT CARDIOVERSION N/A 09/12/2021   Procedure: TRANSESOPHAGEAL ECHOCARDIOGRAM (TEE);  Surgeon: Kerrin Elspeth BROCKS, MD;  Location: [facility] OR;  Service: Open Heart Surgery;  Laterality: N/A;   TEE WITHOUT CARDIOVERSION N/A 09/17/2021   Procedure: TRANSESOPHAGEAL ECHOCARDIOGRAM (TEE);  Surgeon: Lucas Dorise POUR, MD;  Location: [facility] OR;  Service: Open Heart Surgery;  Laterality: N/A;    Family History: Family History  Problem Relation Age of Onset   Hypertension Mother    Stroke Mother    Hypertension Father    Hyperlipidemia Father    Heart attack Father    Heart failure Father    Stroke Sister     Social History: Social History   Socioeconomic History   Marital status: Married    Spouse name: Not on file   Number of children: Not on file   Years of education: Not on file   Highest education level: Not on file  Occupational History   Not on file  Tobacco  Use   Smoking status: Former   Smokeless tobacco: Never  Vaping Use   Vaping status: Never Used  Substance and Sexual Activity   Alcohol use: No   Drug use: No   Sexual activity: Not on file  Other Topics Concern   Not on file  Social History Narrative   Not on file   Social Drivers of Health   Financial Resource Strain: Not on file  Food Insecurity: No Food Insecurity (07/02/2024)   Hunger Vital Sign    Worried About Running Out of Food in the Last Year: Never true    Ran Out of Food in the Last Year: Never true  Transportation Needs: No Transportation Needs (07/02/2024)   PRAPARE - Administrator, Civil Service (Medical): No    Lack of Transportation (Non-Medical): No  Physical Activity: Not on file  Stress: Not on file  Social Connections: Moderately Integrated (07/02/2024)   Social Connection and Isolation Panel    Frequency of Communication with Friends and Family: More than three times a week    Frequency of Social Gatherings with Friends and Family: More than three times a week    Attends Religious Services: Never    Database Administrator or Organizations: Yes    Attends Banker Meetings: 1 to 4 times per year    Marital Status: Married    Allergies:  Allergies  Allergen Reactions   Neosporin [Neomycin-Bacitracin Zn-Polymyx] Hives   Doxycycline  Nausea And Vomiting   Mushroom Extract Complex (Obsolete) Nausea And Vomiting   Tape Other (See Comments)    Some tapes/dressings can irritate the skin    Objective:    Vital Signs:   BP: ()/()  Arterial Line BP: ()/()    There were no vitals filed for this visit.  Mean arterial Pressure 75  Physical Exam    General:  Appears weak Neck: supple. JVP  Cor: Mechanical heart sounds with LVAD hum present. Lungs: Clear Abdomen: soft, nontender, nondistended.  Good bowel sounds. Driveline: C/D/I; securement device intact Extremities: no cyanosis, clubbing, trace , edema Neuro: alert &  orientedx3, Telemetry     EKG     Labs    Basic Metabolic Panel: Recent Labs  Lab 07/12/24 1025  NA 136  K 2.8*  CL 107  CO2 26  GLUCOSE 92  BUN 16  CREATININE 1.69*  CALCIUM  8.6*    Liver Function Tests: No results for input(s): AST, ALT, ALKPHOS, BILITOT, PROT, ALBUMIN  in the last 168 hours. No results for input(s): LIPASE, AMYLASE in the last 168 hours. No results for input(s): AMMONIA in the last 168 hours.  CBC: Recent Labs  Lab 07/12/24 1025  WBC 3.3*  HGB 10.1*  HCT 33.0*  MCV 81.5  PLT 329    Cardiac Enzymes: No results for input(s): CKTOTAL, CKMB, CKMBINDEX, TROPONINI in the last 168 hours.  BNP: BNP (last 3 results) Recent Labs    02/05/24 1106  BNP 343.8*    ProBNP (last 3 results) No results for input(s): PROBNP in the last 8760 hours.   CBG: No results for input(s): GLUCAP in the last 168 hours.  Coagulation Studies: Recent Labs    07/17/24 0000  INR 2.0    Other results:    Imaging    No results found.    Patient Profile:  73 y.o. with history of CAD, HTN, atrial fibrillation, and chronic systolic CHF s/p LVAD HM3.   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    Admit with fever.   Assessment/Plan:    1. Fever Recently hospitalized for UTI. Completed antibiotics 07/14/24. Presenting with N/V and fever.  Obtain blood cultures. Check for C Diff. Obtain CBC/lactifc acid/procalcitonin.  Give 500 LR now.   2.  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  No evidence for overt bleeding.  - CBC today.  - Continue Protonix  40 mg daily.  - Previously recommended that he start monthly octreotide  infusions to decrease risk for recurrent AVM bleeding.  He does not want to start this yet but is willing to start it if he has recurrent bleeding.  2. Chronic systolic CHF:  Long-standing cardiomyopathy.  [employer] dual chamber ICD.   Echo in 12/22 with EF < 20%, severe LV dilation, restrictive diastolic function, moderate RV dysfunction, moderate Isaac, mod-severe TR. Cause of cardiomyopathy is uncertain.  He has a history of CAD, but I do not think that the described CAD from the past could explain his cardiomyopathy, but CAD could have progressed.  He was admitted in 12/22 with cardiogenic shock and Impella 5.5. s/p HM III VAD on 09/17/22.  Post-op course complicated by AKI and RV failure. He was on HD for several months but is now off.  Echo in 7/24 showed EF < 20%, IV septum midline to slightly leftward, RV moderately dilated with moderately decreased systolic function, IVC dilated with severe TR.  Echo in 5/25 with EF 20-25%, mild LVH, LVAD present, mild RV dysfunction, moderate AI, mild RV dysfunction with normal RV size, severe TR.  NYHA class II, volume status looks ok.  LVAD parameters reviewed today and stable, no alarms.  - Hold lasix .    - Continue sildenafil  20 mg tid.  - Continue warfarin, INR goal decreased to 2-2.5 with GI bleeding.  - Check INR/LDH  3. Tricuspid regurgitation:  Tricuspid repair not done at time of VAD due to proximity of ICD wires and hypotension during surgery. He has severe TR.  4. Atrial fibrillation: Paroxysmal.  He is off amiodarone  with resolution of nausea.  I will not restart it.  He has been a-paced recently.  5. CKD stage IV: Suspect intra-op hypotension at the time of LVAD placement led to development of ATN => urine sediment looked like ATN per renal. Had initial CVVH and transitioned  over to HD. He is now off HD.  - Check BMET   6. CAD: History of PCI to OM1 in 2007 and RCA in 2013.  No CP or ACs.  - Deferred cardiac cath in 12/22 due to AKI and plan for VAD - Continue Crestor .   7. Abnormal thyroid  function tests: Patient has had elevated TSH and free T4, mixed pattern. We have stopped amiodarone . 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  Most recent TSH was normal.  - He follows with  endocrinology.   8.. 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  INR goal increased to 2.5-3 but then decreased back to 2-2.5 with GI bleeding.   Admit to 2C.   I reviewed the LVAD parameters from today, and compared the results to the patient's prior recorded data.  No programming changes were made.  The LVAD is functioning within specified parameters.  The patient performs LVAD self-test daily.  LVAD interrogation was negative for any significant power changes, alarms or PI events/speed drops.  LVAD equipment check completed and is in good working order.  Back-up equipment present.   LVAD education done on emergency procedures and precautions and reviewed exit site care.  Length of Stay: 0  Isaac Mosses, NP 07/18/2024, 1:40 PM    VAD Team Pager [phone] (7am - 7am) +++VAD ISSUES ONLY+++   Advanced Heart Failure Team Pager [phone] (M-F; 7a - 5p)  Please contact [facility] for night-coverage after hours (5p -7a ) and weekends on [email or web address] for all non- LVAD Issues  Patient seen with NP, I formulated the plan and agree with the above note.   Patient was recently admitted with UTI/urosepsis, sent home on cefadroxil  which he finished last Friday.  He also reports ongoing watery diarrhea, 2-3 episodes/day for 2-3 weeks, thinks this pre-dated antibiotic therapy for UTI. He was doing well up until yesterday, developed chills during the day yesterday.  Today, he had fever to 100.7 and chills at home as well as nausea/vomiting and ongoing diarrhea.  No dysuria though he says urine appears brown.  Fever now to 102 in the office.  No abdominal pain.  No cough or dyspnea. Driveline site looks benign.    MAP 70s, LVAD parameters stable.   General: Well appearing this am. NAD.  HEENT: Normal. Neck: Supple, JVP 9-10 cm. Carotids OK.  Cardiac:  Mechanical heart sounds with LVAD hum present.  Lungs:  CTAB,  normal effort.  Abdomen:  NT, ND, no HSM. No bruits or masses. +BS  LVAD exit site: Well-healed and incorporated. Dressing dry and intact. No erythema or drainage. Stabilization device present and accurately applied. Driveline dressing changed daily per sterile technique. Extremities:  Warm and dry. No cyanosis, clubbing, rash.  Trace ankle edema.  Neuro:  Alert & oriented x 3. Cranial nerves grossly intact. Moves all 4 extremities w/o difficulty. Affect pleasant    Patient has high fever with chills, recent admission with urosepsis.  Also with watery diarrhea x 2-3 weeks. Driveline site does not look infected, abdomen benign and lungs clear on exam.  He will need admission.  - Worry for recurrent UTI, will send UA, urine culture, blood cultures.  - Procalcitonin and lactic acid. - CXR - With diarrhea, fever, recent antibiotic use, will check for C difficile though cefadroxil  may have caused the diarrhea (he thinks diarrhea preceded cefadroxil , and he has had other abx recently).  - Cover with broad spectrum antibiotics.  Will  review recent regimen with pharmacy to decide which agents to use => vancomycin /cefepime .  - Hold Lasix  and will give 500 cc LR gently.  - Mild elevation in alkaline phosphatase and tbili, noted in the past as well but with fever will get RUQ US .   He appears to be in AF, will get ECG.   Isaac [name] 07/18/2024 2:09 PM

## 2024-07-18 NOTE — Progress Notes (Signed)
 PHARMACY - ANTICOAGULATION CONSULT NOTE  Pharmacy Consult for warfarin  Indication: LVAD  Allergies  Allergen Reactions   Neosporin [Neomycin-Bacitracin Zn-Polymyx] Hives   Doxycycline  Nausea And Vomiting   Mushroom Extract Complex (Obsolete) Nausea And Vomiting   Tape Other (See Comments)    Some tapes/dressings can irritate the skin    Patient Measurements:    Vital Signs: Temp: 102 F (38.9 C) (10/28 1405) BP: 92/66 (10/28 1405) Pulse Rate: 86 (10/28 1405)  Labs: Recent Labs    07/17/24 0000 07/18/24 1312  HGB  --  10.8*  HCT  --  34.8*  PLT  --  274  LABPROT  --  18.7*  INR 2.0 1.5*  CREATININE  --  1.83*    Estimated Creatinine Clearance: 40 mL/min (A) (by C-G formula based on SCr of 1.83 mg/dL (H)).   Medical History: Past Medical History:  Diagnosis Date   Arrhythmia    atrial fibrillation   CHF (congestive heart failure) (HCC)    Chronic kidney disease    Coronary artery disease    Hyperlipidemia    Hypertension    Myocardial infarct [facility])    Assessment: [name], on chronic Coumadin .  INR today down to 1.5.  No overt bleeding or complications noted.    PTA warfarin dosing 2 mg daily with 4 mg on MWF.  Goal of Therapy:  INR 2-2.5 Monitor platelets by anticoagulation protocol: Yes   Plan:  Warfarin 4 mg po x 1 tonight. Daily INR.  Harlene Barlow, [employer], BCCP Clinical Pharmacist  07/18/2024 3:02 PM   [facility] pharmacy phone numbers are listed on [email or web address]

## 2024-07-18 NOTE — Progress Notes (Signed)
 Please see H&P note from today

## 2024-07-18 NOTE — Progress Notes (Signed)
 Pharmacy Antibiotic Note  Isaac [name] is a 73 y.o. male admitted on 07/18/2024 with sepsis.  Pharmacy has been consulted for vancomycin  and cefepime  dosing.  Plan: Vancomycin  1250 mg x 1, then 1000 mg q 24 hrs.  Estimated AUC 455 with Scr 1.83 Cefepime  2g IV q 8hrs. F/u cultures, renal function and clinical course.    Temp (24hrs), Avg:102 F (38.9 C), Min:102 F (38.9 C), Max:102 F (38.9 C)  Recent Labs  Lab 07/12/24 1025 07/18/24 1312  WBC 3.3* 9.1  CREATININE 1.69* 1.83*  LATICACIDVEN  --  1.7    Estimated Creatinine Clearance: 40 mL/min (A) (by C-G formula based on SCr of 1.83 mg/dL (H)).    Allergies  Allergen Reactions   Neosporin [Neomycin-Bacitracin Zn-Polymyx] Hives   Doxycycline  Nausea And Vomiting   Mushroom Extract Complex (Obsolete) Nausea And Vomiting   Tape Other (See Comments)    Some tapes/dressings can irritate the skin    Antimicrobials this admission:  Vancomycin  10/28 >  Cefepime  10/28 >   Dose adjustments this admission:   Microbiology results:  10/28 C.diff  Thank you for allowing pharmacy to be a part of this patient's care.  Harlene Barlow, [employer], BCCP Clinical Pharmacist  07/18/2024 3:06 PM   [facility] pharmacy phone numbers are listed on [email or web address]

## 2024-07-18 NOTE — Progress Notes (Signed)
 Patient presents for sick visit with his wife [name]. Denies issues/concerns with VAD equipment or drive line.    Received call from pts wife this morning stating that [name] has a fever of 100.7, chills, N/V, diarrhea and weakness. Pt recently admitted to the hospital due to UTI with positive blood cultures for E Coli. Discharged on Cefadroxil  1g BID for 10 days. End date 07/14/24. Pt complains of diarrhea since starting antibiotic. Previously advised he may take OTC Immodium however pt has yet to do this. Pt started probiotic but diarrhea has not subsided.   Will admit pt for IV antibiotics and Cdiff rule out.  Vital Signs:  Temp: 102 oral Automatic BP: 92/66 (75) HR: 86 afib SPO2: 97%   Weight: 178.7 lb w/ eqt Discharge weight: 182.8 w/eqt Last Clinic weight: 191 lb w/eqt   VAD Indication: Destination Therapy due to age and kidney disease   LVAD assessment:  HM III: Speed: 5300 rpms Flow: 4.5 Power: 3.8 w    PI: 2.8 Alarms: none Events:  rare  Fixed speed: 5300 Low speed limit: 5000  Primary controller:  Back up battery due for replacement in 32 months Secondary controller: Back up battery due for replacement in 33 months - did not bring   I reviewed the LVAD parameters from today and compared the results to the patient's prior recorded data. LVAD interrogation was NEGATIVE for significant power changes, NEGATIVE for clinical alarms and STABLE for PI events/speed drops. No programming changes were made and pump is functioning within specified parameters. Pt is performing daily controller and system monitor self tests along with completing weekly and monthly maintenance for LVAD equipment.   LVAD equipment check completed and is in good working order. Back-up equipment present.   Annual Maintenance completed: 08/30/2023  Exit Site Care: Wife has been performing weekly dressing changes using weekly dressings. Drive line anchor correctly applied.  Next dressing due  tomorrow.  Device:  [employer] dual  Therapies: on Pacing: VVI 50 Last check: 05/12/24   BP & Labs:  Doppler 104 - Doppler is reflecting modified systolic   Hgb 10.8- No S/S of bleeding. Specifically denies melena/BRBPR or nosebleeds.   LDH 159 with established baseline of 200 - 300. Denies tea-colored urine. No power elevations noted on interrogation.    Patient Instructions:  Admit to 2C  Lauraine Ip RN, BSN VAD Coordinator 24/7 Pager [phone]

## 2024-07-19 ENCOUNTER — Other Ambulatory Visit (HOSPITAL_COMMUNITY): Payer: Self-pay

## 2024-07-19 ENCOUNTER — Encounter (HOSPITAL_COMMUNITY): Payer: Self-pay | Admitting: Cardiology

## 2024-07-19 ENCOUNTER — Telehealth (HOSPITAL_COMMUNITY): Payer: Self-pay | Admitting: Pharmacy Technician

## 2024-07-19 DIAGNOSIS — I5022 Chronic systolic (congestive) heart failure: Secondary | ICD-10-CM | POA: Diagnosis not present

## 2024-07-19 DIAGNOSIS — Z95811 Presence of heart assist device: Secondary | ICD-10-CM | POA: Diagnosis not present

## 2024-07-19 DIAGNOSIS — A0472 Enterocolitis due to Clostridium difficile, not specified as recurrent: Secondary | ICD-10-CM

## 2024-07-19 LAB — CBC
HCT: 35 % — ABNORMAL LOW (ref 39.0–52.0)
Hemoglobin: 11 g/dL — ABNORMAL LOW (ref 13.0–17.0)
MCH: 25.5 pg — ABNORMAL LOW (ref 26.0–34.0)
MCHC: 31.4 g/dL (ref 30.0–36.0)
MCV: 81 fL (ref 80.0–100.0)
Platelets: 237 K/uL (ref 150–400)
RBC: 4.32 MIL/uL (ref 4.22–5.81)
RDW: 20.2 % — ABNORMAL HIGH (ref 11.5–15.5)
WBC: 12.1 K/uL — ABNORMAL HIGH (ref 4.0–10.5)
nRBC: 0 % (ref 0.0–0.2)

## 2024-07-19 LAB — BASIC METABOLIC PANEL WITH GFR
Anion gap: 12 (ref 5–15)
BUN: 21 mg/dL (ref 8–23)
CO2: 20 mmol/L — ABNORMAL LOW (ref 22–32)
Calcium: 9 mg/dL (ref 8.9–10.3)
Chloride: 104 mmol/L (ref 98–111)
Creatinine, Ser: 2.04 mg/dL — ABNORMAL HIGH (ref 0.61–1.24)
GFR, Estimated: 34 mL/min — ABNORMAL LOW (ref >60)
Glucose, Bld: 104 mg/dL — ABNORMAL HIGH (ref 70–99)
Potassium: 4 mmol/L (ref 3.5–5.1)
Sodium: 136 mmol/L (ref 135–145)

## 2024-07-19 LAB — LACTATE DEHYDROGENASE: LDH: 129 U/L (ref 98–192)

## 2024-07-19 LAB — PROTIME-INR
INR: 1.6 — ABNORMAL HIGH (ref 0.8–1.2)
Prothrombin Time: 20.2 s — ABNORMAL HIGH (ref 11.4–15.2)

## 2024-07-19 LAB — GLUCOSE, CAPILLARY: Glucose-Capillary: 90 mg/dL (ref 70–99)

## 2024-07-19 LAB — MAGNESIUM: Magnesium: 1.5 mg/dL — ABNORMAL LOW (ref 1.7–2.4)

## 2024-07-19 MED ORDER — FAMOTIDINE 20 MG PO TABS
40.0000 mg | ORAL_TABLET | Freq: Every day | ORAL | Status: DC
  Administered 2024-07-20 – 2024-07-25 (×6): 40 mg via ORAL
  Filled 2024-07-19 (×7): qty 2

## 2024-07-19 MED ORDER — SILDENAFIL CITRATE 20 MG PO TABS
20.0000 mg | ORAL_TABLET | Freq: Three times a day (TID) | ORAL | Status: DC
  Administered 2024-07-19 – 2024-07-25 (×19): 20 mg via ORAL
  Filled 2024-07-19 (×19): qty 1

## 2024-07-19 MED ORDER — MAGNESIUM SULFATE 4 GM/100ML IV SOLN
4.0000 g | Freq: Once | INTRAVENOUS | Status: AC
  Administered 2024-07-19: 4 g via INTRAVENOUS
  Filled 2024-07-19: qty 100

## 2024-07-19 MED ORDER — GERHARDT'S BUTT CREAM
TOPICAL_CREAM | Freq: Three times a day (TID) | CUTANEOUS | Status: DC
  Administered 2024-07-20: 1 via TOPICAL
  Filled 2024-07-19 (×3): qty 60

## 2024-07-19 MED ORDER — WARFARIN SODIUM 2 MG PO TABS
4.0000 mg | ORAL_TABLET | Freq: Once | ORAL | Status: AC
  Administered 2024-07-19: 4 mg via ORAL
  Filled 2024-07-19: qty 2

## 2024-07-19 MED ORDER — FIDAXOMICIN 200 MG PO TABS
200.0000 mg | ORAL_TABLET | Freq: Two times a day (BID) | ORAL | Status: DC
  Administered 2024-07-19 – 2024-07-25 (×13): 200 mg via ORAL
  Filled 2024-07-19 (×14): qty 1

## 2024-07-19 NOTE — Progress Notes (Addendum)
 LVAD Coordinator Rounding Note:  Pt admitted 07/18/24 for diarrhea. Recently hospitalized for bacteremia and UTI. Completed antibiotics 07/14/24. + CDIFF this admission.   HM3 LVAD implanted on 09/17/21 by Dr Lucas under destination therapy criteria.  Laying in bed asleep on my arrival. Wife [name] is at bedside. Reports multiple episodes of diarrhea overnight.   Currently on Fidaxomicin 200 mg BID for treatment of c-diff.   Vital signs: Temp: 99.9 HR: 79 Doppler Pressure: 78 Auto Cuff BP: 86/64 (71) O2 Sat: 95% on RA Wt: 178.7 lbs    LVAD interrogation reveals:  Speed: 5300 Flow: 4.6 Power: 3.8 w PI: 3.0 Hematocrit: 35  Alarms: none Events: none  Fixed speed: 5300 Low speed limit: 5000  Drive Line:  Weekly dressing changes per VAD coordinator or pts wife. Next dressing change due: today by caregiver. Discussed dressing change with wife at bedside she plans to change dressing this afternoon.  Labs:  LDH trend: 129  INR trend: 1.6  WBC: 12.1  Anticoagulation Plan: - INR Goal: 2-2.5  - Coumadin  dosing per pharmacy  Blood Products:  Device: - Cabin Crew - V paced 60 -Therapies: OFF  Infection:  07/18/24>> cdiff screen >> POSITIVE 07/18/24>> blood cultures >> no growth <24 hrs  Plan/Recommendations:  Page VAD coordinator for drive line or equipment concerns 2. Weekly dressing changes per VAD Coordinator or caregiver.  Isaiah Knoll RN VAD Coordinator  Office: [phone]  24/7 Pager: [phone]

## 2024-07-19 NOTE — Progress Notes (Signed)
 Patient temp 102.4, wife at bedside requesting that we do not give patient Tylenol  for temp. She states I would rather let his natural immune system bring his tempeture down. RN did remove several blankets from patient and applied cool compress.  Patient still having several watery, mucous stools.

## 2024-07-19 NOTE — Progress Notes (Addendum)
 Advanced Heart Failure Rounding Note  Cardiologist: Ezra Shuck, MD  Chief Complaint: C. diff Subjective:    C.diff +. WBC 12. Numerous BM overnight.  Febrile overnight, Tm 102.7F. MAP 70s INR 1.6  Lying in bed. Multiple bowel movements, struggling with some intermittent nausea.   Objective:    Weight Range: 81.1 kg Body mass index is 24.24 kg/m.   Vital Signs:   Temp:  [98.9 F (37.2 C)-102.4 F (39.1 C)] 99.9 F (37.7 C) (10/29 0847) Pulse Rate:  [79-83] 79 (10/29 0847) Resp:  [15-18] 15 (10/29 0847) BP: (83-95)/(60-71) 86/64 (10/29 0847) SpO2:  [95 %-97 %] 95 % (10/29 0847) Weight:  [81.1 kg] 81.1 kg (10/28 1453) Last BM Date : 07/19/24  Weight change: Filed Weights   07/18/24 1453  Weight: 81.1 kg   Intake/Output:  Intake/Output Summary (Last 24 hours) at 07/19/2024 1111 Last data filed at 07/19/2024 0600 Gross per 24 hour  Intake 690 ml  Output 300 ml  Net 390 ml    Physical Exam   General: Weak appearing. No distress on RA Cardiac: Mechanical heart sounds with LVAD hum present.  Resp: Lung sounds clear and equal B/L Driveline: Dressing C/D/I. No drainage or redness. Anchor in place. Extremities: Warm and dry. No edema. Neuro: Alert and oriented x3. Affect pleasant.   LVAD Interrogation HM 3: Speed: 5300 Flow: 4.7 PI: 3.7 Power: 3.4.   Telemetry   SR 80s with frequent PVCs (personally reviewed)  Labs    CBC Recent Labs    07/18/24 1312 07/19/24 0244  WBC 9.1 12.1*  HGB 10.8* 11.0*  HCT 34.8* 35.0*  MCV 80.9 81.0  PLT 274 237   Basic Metabolic Panel Recent Labs    89/71/74 1312 07/19/24 0244  NA 137 136  K 3.6 4.0  CL 105 104  CO2 23 20*  GLUCOSE 92 104*  BUN 19 21  CREATININE 1.83* 2.04*  CALCIUM  8.8* 9.0  MG  --  1.5*   Liver Function Tests Recent Labs    07/18/24 1312  AST 26  ALT 18  ALKPHOS 141*  BILITOT 1.6*  PROT 5.7*  ALBUMIN  2.5*   BNP (last 3 results) Recent Labs    02/05/24 1106  BNP 343.8*    Medications:    Scheduled Medications:  allopurinol   50 mg Oral Daily   famotidine   40 mg Oral Daily   fidaxomicin  200 mg Oral BID   Gerhardt's butt cream   Topical TID   rosuvastatin   10 mg Oral QHS   sildenafil   20 mg Oral TID   warfarin  4 mg Oral ONCE-1600   Warfarin - Pharmacist Dosing Inpatient   Does not apply q1600    Infusions:  lactated ringers  Stopped (07/18/24 1810)   magnesium  sulfate bolus IVPB      PRN Medications: acetaminophen , ondansetron  (ZOFRAN ) IV  Patient Profile   73 y.o. male with history of ICM s/p HM3 Implant, CVA, and GIB. Recent admission this month for acute cystitis. Now presenting with fever, diarrhea, and vomiting.   Assessment/Plan   1. Fever/Leukocytosis - Recently hospitalized for UTI. Completed antibiotics 07/14/24. Presenting with abd pain, N/V and fever. Procal <0.1. LA nl. RUG US  negative for acute cholecystitis, does have 7mm  echogenic layering in lumen (sludge vs polyp) - + for C Diff. Started on vanc/fidaxomicin - WBC 9.1> 12.1 2.  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 No evidence for overt bleeding. Hgb stable. - Continue Protonix   40 mg daily.  - Previously recommended that he start monthly octreotide  infusions to decrease risk for recurrent AVM bleeding.  He does not want to start this yet but is willing to start it if he has recurrent bleeding.  2. Chronic systolic CHF:  Long-standing cardiomyopathy.  [employer] dual chamber ICD.  Echo in 12/22 with EF < 20%, severe LV dilation, restrictive diastolic function, moderate RV dysfunction, moderate MR, mod-severe TR. Cause of cardiomyopathy is uncertain.  He has a history of CAD, but I do not think that the described CAD from the past could explain his cardiomyopathy, but CAD could have progressed.  He was admitted in 12/22 with cardiogenic shock and Impella 5.5. s/p HM III VAD on 09/17/22.  Post-op course complicated by AKI and RV failure. He was on HD for  several months but is now off.  Echo in 7/24 showed EF < 20%, IV septum midline to slightly leftward, RV moderately dilated with moderately decreased systolic function, IVC dilated with severe TR.  Echo in 5/25 with EF 20-25%, mild LVH, LVAD present, mild RV dysfunction, moderate AI, mild RV dysfunction with normal RV size, severe TR.  NYHA class II, volume status looks ok.  LVAD parameters reviewed today and stable, no alarms.  - Hold lasix .    - Continue sildenafil  20 mg tid.  - Continue warfarin, INR goal decreased to 2-2.5 with GI bleeding.  - INR 1.6. D/ PharmD warfarin dosing 3. Tricuspid regurgitation:  Tricuspid repair not done at time of VAD due to proximity of ICD wires and hypotension during surgery. He has severe TR.  4. Atrial fibrillation: Paroxysmal.  He is off amiodarone  with resolution of nausea.  I will not restart it.  He has been a-paced recently.  5. CKD stage IV: Suspect intra-op hypotension at the time of LVAD placement led to development of ATN => urine sediment looked like ATN per renal. Had initial CVVH and transitioned over to HD. He is now off HD.  - sCr at baseline 6. CAD: History of PCI to OM1 in 2007 and RCA in 2013.  No CP or ACs.  - Deferred cardiac cath in 12/22 due to AKI and plan for VAD - Continue Crestor .   7. Abnormal thyroid  function tests: Patient has had elevated TSH and free T4, mixed pattern. We have stopped amiodarone . 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  Most recent TSH was normal.  - He follows with endocrinology.   8.. 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  INR goal increased to 2.5-3 but then decreased back to 2-2.5 with GI bleeding.    I reviewed the LVAD parameters from today, and compared the results to the patient's prior recorded data.  No programming changes were made.  The LVAD is functioning within specified parameters.  The patient performs LVAD  self-test daily.  LVAD interrogation was negative for any significant power changes, alarms or PI events/speed drops.  LVAD equipment check completed and is in good working order.  Back-up equipment present.   LVAD education done on emergency procedures and precautions and reviewed exit site care.  Length of Stay: 1  Jordan Lee, NP  07/19/2024, 11:11 AM  Advanced Heart Failure Team Pager [phone] (M-F; 7a - 5p)  Please contact [facility] for night-coverage after hours (5p -7a ) and weekends on [email or web address]  Patient seen and examined with the above-signed Advanced Practice Provider and/or Housestaff. I personally reviewed laboratory data,  imaging studies and relevant notes. I independently examined the patient and formulated the important aspects of the plan. I have edited the note to reflect any of my changes or salient points. I have personally discussed the plan with the patient and/or family.  Still with frequent watery BMs. Poor appetite. + Nausea. Tm 102.4  General:  Weak appearing NAD.  HEENT: normal  Neck: supple. JVP not elevated.  Carotids 2+ bilat; no bruits. No lymphadenopathy or thryomegaly appreciated. Cor: LVAD hum.  Lungs: Clear. Abdomen: soft, nontender, non-distended. No hepatosplenomegaly. No bruits or masses. Good bowel sounds. Driveline site clean. Anchor in place.  Extremities: no cyanosis, clubbing, rash. Warm no edema  Neuro: alert & oriented x 3. No focal deficits. Moves all 4 without problem   He is c. Diff+ in setting of recently completing abx for DL infection. Appreciate ID input. Will give zofran  as well. May need more IVF   MAPs ok. VAD interrogated personally. Parameters stable.  INR 1.6 Discussed warfarin dosing with PharmD personally.  Toribio Fuel, MD  1:49 PM

## 2024-07-19 NOTE — Progress Notes (Signed)
 PHARMACY - ANTICOAGULATION CONSULT NOTE  Pharmacy Consult for warfarin  Indication: LVAD  Allergies  Allergen Reactions   Neosporin [Neomycin-Bacitracin Zn-Polymyx] Hives   Doxycycline  Nausea And Vomiting   Mushroom Extract Complex (Obsolete) Nausea And Vomiting   Tape Other (See Comments)    Some tapes/dressings can irritate the skin    Patient Measurements: Height: 6' (182.9 cm) Weight: 81.1 kg (178 lb 11.2 oz) IBW/kg (Calculated) : 77.6 HEPARIN  DW (KG): 81.1  Vital Signs: Temp: 99.9 F (37.7 C) (10/29 0847) Temp Source: Oral (10/29 0847) BP: 86/64 (10/29 0847) Pulse Rate: 79 (10/29 0847)  Labs: Recent Labs    07/17/24 0000 07/18/24 1312 07/19/24 0244  HGB  --  10.8* 11.0*  HCT  --  34.8* 35.0*  PLT  --  274 237  LABPROT  --  18.7* 20.2*  INR 2.0 1.5* 1.6*  CREATININE  --  1.83* 2.04*    Estimated Creatinine Clearance: 35.9 mL/min (A) (by C-G formula based on SCr of 2.04 mg/dL (H)).   Medical History: Past Medical History:  Diagnosis Date   Arrhythmia    atrial fibrillation   CHF (congestive heart failure) (HCC)    Chronic kidney disease    Coronary artery disease    Hyperlipidemia    Hypertension    Myocardial infarct [facility])    Assessment: [name], on chronic Coumadin .  INR today down to 1.5.  No overt bleeding or complications noted.    PTA warfarin dosing 2 mg daily with 4 mg on MWF.  Goal of Therapy:  INR 2-2.5 Monitor platelets by anticoagulation protocol: Yes   Plan:  Warfarin 4 mg po x 1 again tonight. Daily INR.  Harlene Barlow, [employer], BCCP Clinical Pharmacist  07/19/2024 9:21 AM   [facility] pharmacy phone numbers are listed on [email or web address]

## 2024-07-19 NOTE — Plan of Care (Signed)
  Problem: Education: Goal: Patient will understand all VAD equipment and how it functions Outcome: Progressing Goal: Patient will be able to verbalize current INR target range and antiplatelet therapy for discharge home Outcome: Progressing   Problem: Cardiac: Goal: LVAD will function as expected and patient will experience no clinical alarms Outcome: Progressing   Problem: Education: Goal: Knowledge of General Education information will improve Description: Including pain rating scale, medication(s)/side effects and non-pharmacologic comfort measures Outcome: Progressing   Problem: Health Behavior/Discharge Planning: Goal: Ability to manage health-related needs will improve Outcome: Progressing   Problem: Clinical Measurements: Goal: Ability to maintain clinical measurements within normal limits will improve Outcome: Progressing Goal: Will remain free from infection Outcome: Progressing Goal: Diagnostic test results will improve Outcome: Progressing Goal: Respiratory complications will improve Outcome: Progressing Goal: Cardiovascular complication will be avoided Outcome: Progressing   Problem: Activity: Goal: Risk for activity intolerance will decrease Outcome: Progressing   Problem: Nutrition: Goal: Adequate nutrition will be maintained Outcome: Progressing   Problem: Coping: Goal: Level of anxiety will decrease Outcome: Progressing   Problem: Elimination: Goal: Will not experience complications related to bowel motility Outcome: Progressing Goal: Will not experience complications related to urinary retention Outcome: Progressing   Problem: Pain Managment: Goal: General experience of comfort will improve and/or be controlled Outcome: Progressing   Problem: Safety: Goal: Ability to remain free from injury will improve Outcome: Progressing   Problem: Skin Integrity: Goal: Risk for impaired skin integrity will decrease Outcome: Progressing

## 2024-07-19 NOTE — Plan of Care (Signed)

## 2024-07-19 NOTE — TOC Initial Note (Signed)
 Transition of Care [facility]) - Initial/Assessment Note    Patient Details  Name: Isaac [name] MRN: [id number] Date of Birth: 1951-06-07  [facility]) CM/SW Contact:    Justina Delcia Czar, RN Phone Number: [phone] 07/19/2024, 6:39 AM  Clinical Narrative:    Readmission             Spoke to pt and wife, pt was resting. Pt was independent pta. Wife assist with care at home. Pt has Rollator, and scale at home.   Will continue to follow for dc needs.  Will need PCP hospital follow up appt at dc.   Expected Discharge Plan: Home w Home Health Services Barriers to Discharge: Continued Medical Work up   Patient Goals and CMS Choice Patient states their goals for this hospitalization and ongoing recovery are:: wants to get better          Expected Discharge Plan and Services   Discharge Planning Services: CM Consult   Living arrangements for the past 2 months: Single Family Home                                      Prior Living Arrangements/Services Living arrangements for the past 2 months: Single Family Home Lives with:: Spouse Patient language and need for interpreter reviewed:: Yes Do you feel safe going back to the place where you live?: Yes      Need for Family Participation in Patient Care: Yes (Comment) Care giver support system in place?: Yes (comment) Current home services: DME (rolling walker, cane, scale, LVAD) Criminal Activity/Legal Involvement Pertinent to Current Situation/Hospitalization: No - Comment as needed  Activities of Daily Living   ADL Screening (condition at time of admission) Independently performs ADLs?: Yes (appropriate for developmental age) Is the patient deaf or have difficulty hearing?: No Does the patient have difficulty seeing, even when wearing glasses/contacts?: No Does the patient have difficulty concentrating, remembering, or making decisions?: No  Permission Sought/Granted Permission sought to share  information with : Case Manager, PCP, Family Supports Permission granted to share information with : Yes, Verbal Permission Granted  Share Information with NAME: Isaac [name]  Permission granted to share info w AGENCY: PCP, DME  Permission granted to share info w Relationship: wife  Permission granted to share info w Contact Information: [phone]  Emotional Assessment Appearance:: Appears stated age Attitude/Demeanor/Rapport: Lethargic   Orientation: : Oriented to Self, Oriented to Place, Oriented to  Time, Oriented to Situation   Psych Involvement: No (comment)  Admission diagnosis:  Fever [R50.9] Patient Active Problem List   Diagnosis Date Noted   Fever 07/18/2024   Urinary tract infection 07/02/2024   Iron  deficiency anemia, unspecified 05/30/2024   Acute gastric ulcer with hemorrhage 03/29/2024   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   Melena 03/22/2024   ABLA (acute blood loss anemia) 03/22/2024   Anticoagulated 03/22/2024   AVM (arteriovenous malformation) of small bowel, acquired with hemorrhage 03/22/2024   Upper GI bleed 03/21/2024   Hematoma 02/17/2024   Anemia 02/15/2024   Hypotension 02/15/2024   Sundowning 02/11/2024   Insomnia 02/11/2024   Constipation 02/11/2024   Stage 3b chronic kidney disease (HCC) 02/11/2024   Cardioembolic stroke (HCC) 02/10/2024   Acute left PCA stroke (HCC) 02/05/2024   Abnormal thyroid  blood test 04/05/2023   Abscess of left groin 11/27/2022   VT (ventricular tachycardia) (HCC) 10/27/2022   ICD (implantable cardioverter-defibrillator) in place 10/27/2022  Pressure injury of skin 10/07/2021   LVAD (left ventricular assist device) present (HCC)    Protein-calorie malnutrition, severe 09/10/2021   Elevated TSH    PICC (peripherally inserted central catheter) in place    Cardiogenic shock (HCC)    Syncope 09/07/2021   CHF (congestive heart failure) (HCC) 09/06/2021   CAD (coronary artery disease) 08/22/2021   Cardiomyopathy (HCC) 08/22/2021    Mixed hyperlipidemia 08/22/2021   A-fib (HCC) 08/22/2021   Elevated troponin 08/22/2021   Acute on chronic combined systolic and diastolic CHF (congestive heart failure) (HCC) 08/22/2021   Benign essential hypertension 02/22/2015   Chronic systolic heart failure (HCC) 07/05/2014   PCP:  Valora Lynwood FALCON, MD Pharmacy:   [id number] [address], [place] - [address] [place] [address] Phone: [phone] Fax: [phone]  [facility] [address] [place] - [address] [place] [address] Phone: [phone] Fax: [phone]  [facility] [address] [place] [address] Phone: [phone] Fax: [phone]     Social Drivers of Health (SDOH) Social History: SDOH Screenings   Food Insecurity: No Food Insecurity (07/18/2024)  Housing: Low Risk  (07/18/2024)  Transportation Needs: No Transportation Needs (07/18/2024)  Utilities: Not At Risk (07/18/2024)  Depression (PHQ2-9): Low Risk  (04/14/2022)  Social Connections: Unknown (07/18/2024)  Tobacco Use: Medium Risk (07/19/2024)   SDOH Interventions:     Readmission Risk Interventions     No data to display

## 2024-07-19 NOTE — Telephone Encounter (Signed)
 Patient Product/process Development Scientist completed.    The patient is insured through [employer]. Patient has Medicare and is not eligible for a copay card, but may be able to apply for patient assistance or Medicare RX Payment Plan (Patient Must reach out to their plan, if eligible for payment plan), if available.    Ran test claim for Dificid 200 mg and the current 10 day co-pay is $100.00.  Ran test claim for vancomycin  125 mg and the current 10 day co-pay is $10.00.  This test claim was processed through [facility]- copay amounts may vary at other pharmacies due to pharmacy/plan contracts, or as the patient moves through the different stages of their insurance plan.     Reyes Sharps, CPHT Pharmacy Technician Patient Advocate Specialist Lead [facility] Patient Advocate Team Direct Number: [phone]  Fax: [phone]

## 2024-07-20 ENCOUNTER — Ambulatory Visit: Admitting: Physical Therapy

## 2024-07-20 ENCOUNTER — Encounter: Admitting: Speech Pathology

## 2024-07-20 ENCOUNTER — Inpatient Hospital Stay (HOSPITAL_COMMUNITY)

## 2024-07-20 DIAGNOSIS — Z95811 Presence of heart assist device: Secondary | ICD-10-CM | POA: Diagnosis not present

## 2024-07-20 DIAGNOSIS — I5022 Chronic systolic (congestive) heart failure: Secondary | ICD-10-CM | POA: Diagnosis not present

## 2024-07-20 DIAGNOSIS — A0472 Enterocolitis due to Clostridium difficile, not specified as recurrent: Secondary | ICD-10-CM | POA: Diagnosis not present

## 2024-07-20 LAB — CBC
HCT: 32.8 % — ABNORMAL LOW (ref 39.0–52.0)
Hemoglobin: 10.4 g/dL — ABNORMAL LOW (ref 13.0–17.0)
MCH: 25.4 pg — ABNORMAL LOW (ref 26.0–34.0)
MCHC: 31.7 g/dL (ref 30.0–36.0)
MCV: 80 fL (ref 80.0–100.0)
Platelets: 211 K/uL (ref 150–400)
RBC: 4.1 MIL/uL — ABNORMAL LOW (ref 4.22–5.81)
RDW: 20.6 % — ABNORMAL HIGH (ref 11.5–15.5)
WBC: 12.2 K/uL — ABNORMAL HIGH (ref 4.0–10.5)
nRBC: 0 % (ref 0.0–0.2)

## 2024-07-20 LAB — BASIC METABOLIC PANEL WITH GFR
Anion gap: 11 (ref 5–15)
BUN: 26 mg/dL — ABNORMAL HIGH (ref 8–23)
CO2: 22 mmol/L (ref 22–32)
Calcium: 8.5 mg/dL — ABNORMAL LOW (ref 8.9–10.3)
Chloride: 105 mmol/L (ref 98–111)
Creatinine, Ser: 2.09 mg/dL — ABNORMAL HIGH (ref 0.61–1.24)
GFR, Estimated: 33 mL/min — ABNORMAL LOW (ref >60)
Glucose, Bld: 102 mg/dL — ABNORMAL HIGH (ref 70–99)
Potassium: 3.6 mmol/L (ref 3.5–5.1)
Sodium: 138 mmol/L (ref 135–145)

## 2024-07-20 LAB — PROTIME-INR
INR: 2.1 — ABNORMAL HIGH (ref 0.8–1.2)
Prothrombin Time: 24.8 s — ABNORMAL HIGH (ref 11.4–15.2)

## 2024-07-20 LAB — LACTATE DEHYDROGENASE: LDH: 114 U/L (ref 98–192)

## 2024-07-20 LAB — MAGNESIUM: Magnesium: 2.3 mg/dL (ref 1.7–2.4)

## 2024-07-20 MED ORDER — KATE FARMS STANDARD 1.4 EN LIQD
325.0000 mL | Freq: Two times a day (BID) | ENTERAL | Status: DC
  Administered 2024-07-21 – 2024-07-22 (×2): 325 mL via ORAL
  Filled 2024-07-20 (×11): qty 325

## 2024-07-20 MED ORDER — WARFARIN SODIUM 1 MG PO TABS
1.0000 mg | ORAL_TABLET | Freq: Once | ORAL | Status: AC
Start: 2024-07-20 — End: 2024-07-20
  Administered 2024-07-20: 1 mg via ORAL
  Filled 2024-07-20: qty 1

## 2024-07-20 MED ORDER — SACCHAROMYCES BOULARDII 250 MG PO CAPS
250.0000 mg | ORAL_CAPSULE | Freq: Two times a day (BID) | ORAL | Status: DC
  Administered 2024-07-20 – 2024-07-25 (×10): 250 mg via ORAL
  Filled 2024-07-20 (×11): qty 1

## 2024-07-20 MED ORDER — POTASSIUM CHLORIDE CRYS ER 20 MEQ PO TBCR
20.0000 meq | EXTENDED_RELEASE_TABLET | Freq: Once | ORAL | Status: AC
  Administered 2024-07-20: 20 meq via ORAL
  Filled 2024-07-20: qty 1

## 2024-07-20 NOTE — Progress Notes (Signed)
 Initial Nutrition Assessment  DOCUMENTATION CODES:   Not applicable  INTERVENTION:  Provided patient and wife with Low-Sodium Nutrition Therapy handout. 250 mg Florastor BID for C. Diff diarrhea. Kate Farms 1.4 PO BID. Each supplement provides 455 kcal and 20 grams protein. Continue 2 g Sodium diet.  NUTRITION DIAGNOSIS:   Inadequate oral intake related to acute illness as evidenced by meal completion < 25%.  GOAL:   Patient will meet greater than or equal to 90% of their needs  MONITOR:   PO intake, Supplement acceptance  REASON FOR ASSESSMENT:   Consult Assessment of nutrition requirement/status, Diet education  ASSESSMENT:   PMH significant for ICM s/p LVAD 09/17/21, CHF, CAD, CVA 01/2024, HTN, dyslipidemia, CKD4, GIB 2/2 AVM s/p EGD/colonoscopy 03/29/2024, presented with fever, abdominal pain, vomiting and diarrhea, found to have C diff.  Visited the patient with his wife [name] at bedside. The patient states he was eating well up until the day prior to admission and has not eaten anything since. He has only had 4 cranberry juices today. His nausea has improved but he just does not have an appetite. They typically eat very healthy and watch for ingredients and sodium content in their foods. His typical intake is eggs with cheese for breakfast and salmon or tuna with vegetables for lunch and dinner. He is a ambulance person. He does not want Ensure due to the ingredients but is amenable to [employer]. [name] will make homemade smoothies with pea protein and bring them in for him. We discussed probiotics and specifically S. Boulardii for C. Diff diarrhea. He reports that his weight has been stable.   Scheduled Meds:  allopurinol   50 mg Oral Daily   famotidine   40 mg Oral Daily   fidaxomicin  200 mg Oral BID   Gerhardt's butt cream   Topical TID   rosuvastatin   10 mg Oral QHS   sildenafil   20 mg Oral TID   Warfarin - Pharmacist Dosing Inpatient   Does not apply q1600   Labs:      Latest Ref Rng & Units 07/20/2024    3:51 AM 07/19/2024    2:44 AM 07/18/2024    1:12 PM  CMP  Glucose 70 - 99 mg/dL 897  895  92   BUN 8 - 23 mg/dL 26  21  19    Creatinine 0.61 - 1.24 mg/dL 7.90  7.95  8.16   Sodium 135 - 145 mmol/L 138  136  137   Potassium 3.5 - 5.1 mmol/L 3.6  4.0  3.6   Chloride 98 - 111 mmol/L 105  104  105   CO2 22 - 32 mmol/L 22  20  23    Calcium  8.9 - 10.3 mg/dL 8.5  9.0  8.8   Total Protein 6.5 - 8.1 g/dL   5.7   Total Bilirubin 0.0 - 1.2 mg/dL   1.6   Alkaline Phos 38 - 126 U/L   141   AST 15 - 41 U/L   26   ALT 0 - 44 U/L   18      I/O: +550 mL since admit  Meal Intake: 0% per patient   NUTRITION - FOCUSED PHYSICAL EXAM:  Flowsheet Row Most Recent Value  Orbital Region No depletion  Upper Arm Region No depletion  Thoracic and Lumbar Region No depletion  Buccal Region No depletion  Temple Region No depletion  Clavicle Bone Region Mild depletion  Clavicle and Acromion Bone Region No depletion  Scapular Bone Region No  depletion  Dorsal Hand No depletion  Patellar Region Mild depletion  Anterior Thigh Region No depletion  Posterior Calf Region Mild depletion  Edema (RD Assessment) Mild  [+1 BLE]  Hair Reviewed  Eyes Reviewed  Mouth Reviewed  Skin Reviewed  Nails Reviewed    Diet Order             Diet 2 gram sodium Room service appropriate? Yes; Fluid consistency: Thin  Diet effective now                   EDUCATION NEEDS:   Education needs have been addressed  Skin:  Skin Assessment: Reviewed RN Assessment  Last BM:  10/29 type 6  Height:   Ht Readings from Last 1 Encounters:  07/18/24 6' (1.829 m)    Weight:    Ideal Body Weight:  81 kg  BMI:  Body mass index is 24.4 kg/m.  Estimated Nutritional Needs:   Kcal:  2000-2400  Protein:  120-140  Fluid:  2000-2400    Leverne Ruth, MS, RDN, LDN [place]. [facility] See AMION for contact information

## 2024-07-20 NOTE — Progress Notes (Signed)
 PHARMACY - ANTICOAGULATION CONSULT NOTE  Pharmacy Consult for warfarin  Indication: LVAD  Allergies  Allergen Reactions   Neosporin [Neomycin-Bacitracin Zn-Polymyx] Hives   Doxycycline  Nausea And Vomiting   Mushroom Extract Complex (Obsolete) Nausea And Vomiting   Tape Other (See Comments)    Some tapes/dressings can irritate the skin    Patient Measurements: Height: 6' (182.9 cm) Weight: 81.6 kg (179 lb 14.3 oz) IBW/kg (Calculated) : 77.6 HEPARIN  DW (KG): 81.1  Vital Signs: Temp: 98.5 F (36.9 C) (10/30 0727) Temp Source: Oral (10/30 0727) BP: 78/65 (10/30 0727) Pulse Rate: 91 (10/30 0727)  Labs: Recent Labs    07/18/24 1312 07/19/24 0244 07/20/24 0351  HGB 10.8* 11.0* 10.4*  HCT 34.8* 35.0* 32.8*  PLT 274 237 211  LABPROT 18.7* 20.2* 24.8*  INR 1.5* 1.6* 2.1*  CREATININE 1.83* 2.04* 2.09*    Estimated Creatinine Clearance: 35.1 mL/min (A) (by C-G formula based on SCr of 2.09 mg/dL (H)).   Medical History: Past Medical History:  Diagnosis Date   Arrhythmia    atrial fibrillation   CHF (congestive heart failure) (HCC)    Chronic kidney disease    Coronary artery disease    Hyperlipidemia    Hypertension    Myocardial infarct [facility])    Assessment: [name], on chronic Coumadin . PTA warfarin dosing 2 mg daily with 4 mg on MWF.  INR increased from 1.6 to 2.1 - got 2 doses of warfarin 4 mg. Hgb 10.4, plt 211. LDH stable at 114. Nausea is improving but still with poor appetite.   Goal of Therapy:  INR 2-2.5 Monitor platelets by anticoagulation protocol: Yes   Plan:  Warfarin 1 mg po tonight Daily INR.  Thank you for allowing pharmacy to participate in this patient's care,  Suzen Sour, PharmD, BCCCP Clinical Pharmacist  Phone: [phone] 07/20/2024 9:23 AM  Please check AMION for all [facility] phone numbers After 10:00 PM, call Main Pharmacy [phone]   [facility] pharmacy phone numbers are listed on [email or web address]

## 2024-07-20 NOTE — Progress Notes (Addendum)
 Advanced Heart Failure VAD Team Note  PCP-Cardiologist: Ezra Shuck, MD  Chief Complaint: C. Diff    Patient Profile:     73 y.o. male with history of ICM s/p HM3 Implant, CVA, and GIB. Recent admission this month for acute cystitis, complete abx 07/14/24. Now presenting with fever, diarrhea, and vomiting. Ruled in for C. Diff.   Subjective:    On fidaxomicin    WBC still elevated at 12K but now afebrile. Numerous BMs yesterday but appears to be slowing. +exquisite point tenderness RLQ. Nausea resolved but appetite remains poor.     MAPs 70s    INR 2.1    LVAD INTERROGATION:  HeartMate III LVAD:   Flow 4.5 liters/min, speed 5300, power 3.9, PI 3.3.  No PI events    Objective:    Vital Signs:   Temp:  [98.3 F (36.8 C)-100.3 F (37.9 C)] 98.5 F (36.9 C) (10/30 0727) Pulse Rate:  [74-91] 91 (10/30 0727) Resp:  [18-19] 19 (10/30 0727) BP: (78-99)/(64-71) 78/65 (10/30 0727) SpO2:  [94 %-97 %] 94 % (10/30 0727) Weight:  [81.6 kg] 81.6 kg (10/30 0610) Last BM Date : 07/19/24 Mean arterial Pressure 78  Intake/Output:   Intake/Output Summary (Last 24 hours) at 07/20/2024 1049 Last data filed at 07/20/2024 0700 Gross per 24 hour  Intake 360 ml  Output 200 ml  Net 160 ml     Physical Exam    General:  fatigued appearing. No resp difficulty HEENT: normal Neck: JVD 16 cm  Cor: Mechanical heart sounds with LVAD hum present. Lungs: clear Abdomen: soft, nondistended + point tenderness RLQ. No hepatosplenomegaly. No bruits or masses. Good bowel sounds. Driveline: C/D/I; securement device intact and driveline incorporated Extremities: no cyanosis, clubbing, rash, edema Neuro: alert & orientedx3, cranial nerves grossly intact. moves all 4 extremities w/o difficulty. Affect pleasant   Telemetry   A-V paced 60s, occasional PVCs, NSVT (5 beats), personally reviewed   EKG    N/A   Labs   Basic Metabolic Panel: Recent Labs  Lab 07/18/24 1312 07/19/24 0244  07/20/24 0351  NA 137 136 138  K 3.6 4.0 3.6  CL 105 104 105  CO2 23 20* 22  GLUCOSE 92 104* 102*  BUN 19 21 26*  CREATININE 1.83* 2.04* 2.09*  CALCIUM  8.8* 9.0 8.5*  MG  --  1.5* 2.3    Liver Function Tests: Recent Labs  Lab 07/18/24 1312  AST 26  ALT 18  ALKPHOS 141*  BILITOT 1.6*  PROT 5.7*  ALBUMIN  2.5*   No results for input(s): LIPASE, AMYLASE in the last 168 hours. No results for input(s): AMMONIA in the last 168 hours.  CBC: Recent Labs  Lab 07/18/24 1312 07/19/24 0244 07/20/24 0351  WBC 9.1 12.1* 12.2*  HGB 10.8* 11.0* 10.4*  HCT 34.8* 35.0* 32.8*  MCV 80.9 81.0 80.0  PLT 274 237 211    INR: Recent Labs  Lab 07/17/24 0000 07/18/24 1312 07/19/24 0244 07/20/24 0351  INR 2.0 1.5* 1.6* 2.1*    Other results: EKG:    Imaging   US  Abdomen Limited RUQ (LIVER/GB) Result Date: 07/19/2024 EXAM: Right Upper Quadrant Abdominal Ultrasound 07/18/2024 10:38:00 PM COMPARISON: ct abd/pelvis 03/21/24 CLINICAL HISTORY: Nausea and vomiting, abdominal pain. TECHNIQUE: Real-time ultrasonography of the right upper quadrant of the abdomen was performed. FINDINGS: LIVER: The liver demonstrates normal echogenicity. No intrahepatic biliary ductal dilatation. No evidence of mass. BILIARY SYSTEM: Contracted gallbladder. No gallbladder wall thickening or pericholecystic fluid. A 7 mm echogenic material  layering within the gallbladder lumen with no evaluation with use of color doppler limiting evaluation. Negative sonographic Murphy's sign. Common bile duct normal in caliber. OTHER: No right upper quadrant ascites. IMPRESSION: 1. Contracted gallbladder with 7 mm echogenic material layering within the lumen, favoring polyp versus sludge; recommend follow-up ultrasound in 6 months for reevaluation. 2. No sonographic evidence of acute cholecystitis. Electronically signed by: Morgane Naveau MD 07/19/2024 01:44 AM EDT RP Workstation: [id number]   DG Chest Port 1 View Result  Date: 07/18/2024 CLINICAL DATA:  Fever. EXAM: PORTABLE CHEST 1 VIEW COMPARISON:  Chest radiograph dated 07/01/2024 FINDINGS: No focal consolidation, pleural effusion or pneumothorax. Stable cardiomegaly. Left pectoral AICD device and LVAD. Median sternotomy wires. No acute osseous pathology. IMPRESSION: 1. No active disease. 2. Cardiomegaly. Electronically Signed   By: Vanetta Chou M.D.   On: 07/18/2024 15:24     Medications:     Scheduled Medications:  allopurinol   50 mg Oral Daily   famotidine   40 mg Oral Daily   fidaxomicin  200 mg Oral BID   Gerhardt's butt cream   Topical TID   rosuvastatin   10 mg Oral QHS   sildenafil   20 mg Oral TID   Warfarin - Pharmacist Dosing Inpatient   Does not apply q1600    Infusions:   PRN Medications: acetaminophen , ondansetron  (ZOFRAN ) IV    Assessment/Plan:    1. C. Diff  - P/w fever/leukocytosis - Recently hospitalized for UTI. Completed antibiotics 07/14/24. Presenting with abd pain, N/V and fever. Procal <0.1. LA nl. RUG US  negative for acute cholecystitis, does have 7mm  echogenic layering in lumen (sludge vs polyp) - + for C Diff. Treating w/ fidaxomicin - WBC 9.1> 12.1>>12K. Fevers resolved  - he has exquisite point tenderness in RLQ.  Likely 2/2 c diff infection but if continues/worsens or if worsening leukocytosis will need CT of abdomen to r/o appendicitis  2.  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 No evidence for overt bleeding. Hgb stable. - Continue Protonix  40 mg daily.  - Previously recommended that he start monthly octreotide  infusions to decrease risk for recurrent AVM bleeding.  He does not want to start this yet but is willing to start it if he has recurrent bleeding.  3. Chronic systolic CHF:  Long-standing cardiomyopathy.  [employer] dual chamber ICD.  Echo in 12/22 with EF < 20%, severe LV dilation, restrictive diastolic function, moderate RV dysfunction, moderate MR, mod-severe TR. Cause  of cardiomyopathy is uncertain.  He has a history of CAD, but I do not think that the described CAD from the past could explain his cardiomyopathy, but CAD could have progressed.  He was admitted in 12/22 with cardiogenic shock and Impella 5.5. s/p HM III VAD on 09/17/22.  Post-op course complicated by AKI and RV failure. He was on HD for several months but is now off.  Echo in 7/24 showed EF < 20%, IV septum midline to slightly leftward, RV moderately dilated with moderately decreased systolic function, IVC dilated with severe TR.  Echo in 5/25 with EF 20-25%, mild LVH, LVAD present, mild RV dysfunction, moderate AI, mild RV dysfunction with normal RV size, severe TR.  NYHA class II, volume ok.  LVAD parameters reviewed today and stable, no alarms. He is volume overloaded on exam  - hold diuretics again today w/ ongoing diarrhea  - Continue sildenafil  20 mg tid. MAPs ok  - Continue warfarin, INR goal decreased to 2-2.5 with GI bleeding.  -  INR 2.1. D/ PharmD warfarin dosing 3. Tricuspid regurgitation:  Tricuspid repair not done at time of VAD due to proximity of ICD wires and hypotension during surgery. He has severe TR.  4. Atrial fibrillation: Paroxysmal.  He is off amiodarone  with resolution of nausea.  I will not restart it.  He has been a-paced recently.  5. CKD stage IV: Suspect intra-op hypotension at the time of LVAD placement led to development of ATN => urine sediment looked like ATN per renal. Had initial CVVH and transitioned over to HD. He is now off HD.  - sCr at baseline 6. CAD: History of PCI to OM1 in 2007 and RCA in 2013.  No CP or ACs.  - Deferred cardiac cath in 12/22 due to AKI and plan for VAD - Continue Crestor .   7. Abnormal thyroid  function tests: Patient has had elevated TSH and free T4, mixed pattern. We have stopped amiodarone . 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  Most recent TSH was normal.  - He follows with endocrinology.   8. CVA: Cardioembolic in 5/25 in setting  atrial fibrillation and LVAD.  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  INR goal increased to 2.5-3 but then decreased back to 2-2.5 with GI bleeding.      I reviewed the LVAD parameters from today, and compared the results to the patient's prior recorded data.  No programming changes were made.  The LVAD is functioning within specified parameters.  The patient performs LVAD self-test daily.  LVAD interrogation was negative for any significant power changes, alarms or PI events/speed drops.  LVAD equipment check completed and is in good working order.  Back-up equipment present.   LVAD education done on emergency procedures and precautions and reviewed exit site care.  Length of Stay: 2  Caffie Shed, PA-C 07/20/2024, 10:49 AM  VAD Team --- VAD ISSUES ONLY--- Pager [phone] (7am - 7am)  Advanced Heart Failure Team  Pager [phone] (M-F; 7a - 5p) Please contact [facility] for night-coverage after hours (5p -7a ) and weekends on [email or web address]   Patient seen and examined with the above-signed Advanced Practice Provider and/or Housestaff. I personally reviewed laboratory data, imaging studies and relevant notes. I independently examined the patient and formulated the important aspects of the plan. I have edited the note to reflect any of my changes or salient points. I have personally discussed the plan with the patient and/or family.  Still with poor appetite and ongoing diarrhea, Being treated for C diff.   Tender in RLQ    General:  NAD.  HEENT: normal  Neck: supple. JVP to jaw with prominent v waves   Carotids 2+ bilat; no bruits. No lymphadenopathy or thryomegaly appreciated. Cor: LVAD hum.  Lungs: Clear. Abdomen: obese soft, tender RLQ no rebound . Good bowel sounds. Driveline site clean. Anchor in place.  Extremities: no cyanosis, clubbing, rash. Warm no edema  Neuro: alert & oriented x 3. No focal deficits. Moves all 4 without problem   Continue c. Diff  treatment per ID recs. He is tender RLQ. Will get KUB. Can get CT as needed.   Po intake remains poor. Encouraged fluid intake.   INR 2.1 Discussed warfarin dosing with PharmD personally.  VAD interrogated personally. Parameters stable.  Toribio Fuel, MD  1:38 PM

## 2024-07-20 NOTE — Discharge Summary (Signed)
 Advanced Heart Failure Team  Discharge Summary   Patient ID: Isaac [name]: [id number], DOB/AGE: 1951/02/19 73 y.o. Admit date: 07/18/2024 D/C date:     07/25/2024   Primary Discharge Diagnoses:  C.Diff colitis  Secondary Discharge Diagnoses:  Chronic Systolic Heart Failure d/t ICM, S/p HM3 LVAD  CKD IV CAD PAF Hypomagnesemia  Hospital Course:  73 y.o. male with history of ICM s/p HM3 Implant, CVA, and GIB. Recent admission earlier this month for UTI/urosepsis, complete abx 07/14/24. He presented to VAD clinic as an acute work in given development of fevers, diarrhea, and vomiting. In clinic, he had a temp of 102. MAP 70s. LVAD parameters stable. DL site benign. He was direct admitted for further management and work-up.   UA, Ucx and Bcx were collected. He was placed on empiric broad spectrum abx. Lasix  held and he was given gentle IVF bolus. PCT returned normal, as did lactate. RUQ US  negative for acute cholecystitis, but did show a 7mm echogenic layering in lumen (sludge vs polyp). CXR unremarkable. Stool testing returned positive for C.diff.  Abx switched to fidaxomicin and will complete 10 days.   From HF/VAD perspective he remained stable. INR adjusted as needed. INR goal remains 2-2.5  See below for detailed problems list. He will contine to be followed closely in the HF clinic.    LVAD Interrogation HM III:   Speed: 5500     Flow:4.3       PI:   4.6   Power:       Back-up speed:   5500     Discharge Vitals: Blood pressure (!) 88/76, pulse 67, temperature 98 F (36.7 C), temperature source Oral, resp. rate 19, height 6' (1.829 m), weight 75.4 kg, SpO2 98%. Physical Exam: GENERAL: No acute distress. NECK: Supple, JVP  flat   CARDIAC:  Mechanical heart sounds with LVAD hum present.  LUNGS:  Clear to auscultation bilaterally.  ABDOMEN:  Soft, round, nontender, positive bowel sounds x4.     LVAD exit site:  Dressing dry and intact.   EXTREMITIES:  Warm and dry, no  cyanosis, clubbing, rash or edema  NEUROLOGIC:  Alert and oriented x 3.    Labs: Lab Results  Component Value Date   WBC 4.2 07/25/2024   HGB 10.5 (L) 07/25/2024   HCT 33.0 (L) 07/25/2024   MCV 78.9 (L) 07/25/2024   PLT 269 07/25/2024    Recent Labs  Lab 07/18/24 1312 07/19/24 0244 07/25/24 0253  NA 137   < > 137  K 3.6   < > 4.2  CL 105   < > 107  CO2 23   < > 23  BUN 19   < > 24*  CREATININE 1.83*   < > 1.75*  CALCIUM  8.8*   < > 8.4*  PROT 5.7*  --   --   BILITOT 1.6*  --   --   ALKPHOS 141*  --   --   ALT 18  --   --   AST 26  --   --   GLUCOSE 92   < > 89   < > = values in this interval not displayed.   Lab Results  Component Value Date   CHOL 118 02/06/2024   HDL 39 (L) 02/06/2024   LDLCALC 73 02/06/2024   TRIG 32 02/06/2024   BNP (last 3 results) Recent Labs    02/05/24 1106  BNP 343.8*    ProBNP (last 3 results) No results for input(s):  PROBNP in the last 8760 hours.   Diagnostic Studies/Procedures   No results found.   Discharge Medications   Allergies as of 07/25/2024       Reactions   Neosporin [neomycin-bacitracin Zn-polymyx] Hives   Doxycycline  Nausea And Vomiting   Mushroom Extract Complex (obsolete) Nausea And Vomiting   Tape Other (See Comments)   Some tapes/dressings can irritate the skin        Medication List     TAKE these medications    acetaminophen  325 MG tablet Commonly known as: TYLENOL  Take 2 tablets (650 mg total) by mouth every 4 (four) hours as needed for headache or mild pain (pain score 1-3).   allopurinol  100 MG tablet Commonly known as: ZYLOPRIM  TAKE 1/2 TABLET BY MOUTH DAILY   fidaxomicin 200 MG Tabs tablet Commonly known as: DIFICID Take 1 tablet (200 mg total) by mouth 2 (two) times daily.   furosemide  40 MG tablet Commonly known as: LASIX  Take 60 mg by mouth daily.   pantoprazole  40 MG tablet Commonly known as: PROTONIX  Take twice a day for 1 month and then can decrease back to once  daily. What changed:  how much to take how to take this   potassium chloride  SA 20 MEQ tablet Commonly known as: KLOR-CON  M Take 20 mEq by mouth daily.   rosuvastatin  10 MG tablet Commonly known as: CRESTOR  Take 1 tablet (10 mg total) by mouth at bedtime.   saccharomyces boulardii 250 MG capsule Commonly known as: FLORASTOR Take 1 capsule (250 mg total) by mouth 2 (two) times daily.   sildenafil  20 MG tablet Commonly known as: REVATIO  Take 20 mg by mouth 3 (three) times daily.   warfarin 2 MG tablet Commonly known as: COUMADIN  Take as directed. If you are unsure how to take this medication, talk to your nurse or doctor. Original instructions: Take 2 mg by mouth daily.        Disposition   The patient will be discharged in stable condition to home. Discharge Instructions     (HEART FAILURE PATIENTS) Call MD:  Anytime you have any of the following symptoms: 1) 3 pound weight gain in 24 hours or 5 pounds in 1 week 2) shortness of breath, with or without a dry hacking cough 3) swelling in the hands, feet or stomach 4) if you have to sleep on extra pillows at night in order to breathe.   Complete by: As directed    Diet - low sodium heart healthy   Complete by: As directed    INR  Goal: 2 - 2.5   Complete by: As directed    Goal: 2 - 2.5   Increase activity slowly   Complete by: As directed    Page VAD Coordinator at [phone]  Notify for: any VAD alarms, sustained elevations of power >10 watts, sustained drop in Pulse Index <3   Complete by: As directed    Notify for:  any VAD alarms sustained elevations of power >10 watts sustained drop in Pulse Index <3     Speed Settings:   Complete by: As directed    Fixed 5500 RPM Low 5200 RPM       Follow-up Information     Valora Lynwood FALCON, MD Follow up.   Specialty: Family Medicine Why: Provider will contact the patient directly to schedule. Contact information: [address] [place]  [address] [phone]  APP Duration of Discharge Encounter: 10  Signed, Greig Mosses NP-C  07/25/2024, 10:28 AM  MD Duration of Discharge Encounter: 40 mins  Patient seen and examined with the above-signed Advanced Practice Provider and/or Housestaff. I personally reviewed laboratory data, imaging studies and relevant notes. I independently examined the patient and formulated the important aspects of the plan. I have edited the note to reflect any of my changes or salient points. I have personally discussed the plan with the patient and/or family.  He is much improved. Eating well. Denies ab pain. Diarrhea resolved. Anxious to go home  INR 2.1  VAD interrogated personally. Parameters stable.  General:  NAD.  HEENT: normal  Neck: supple. JVP not elevated.  Carotids 2+ bilat; no bruits. No lymphadenopathy or thryomegaly appreciated. Cor: LVAD hum.  Lungs: Clear. Abdomen: soft, nontender, non-distended. No hepatosplenomegaly. No bruits or masses. Good bowel sounds. Driveline site clean. Anchor in place.  Extremities: no cyanosis, clubbing, rash. Warm no edema  Neuro: alert & oriented x 3. No focal deficits. Moves all 4 without problem   OK for d/c home to day with close f/u. VAD coordinators have discussed plan with his wife.   Will supp mag prior to d/c   MD discharge time 36 mins  Toribio Fuel, MD  10:39 AM

## 2024-07-20 NOTE — Plan of Care (Signed)
  Problem: Education: Goal: Patient will understand all VAD equipment and how it functions Outcome: Progressing Goal: Patient will be able to verbalize current INR target range and antiplatelet therapy for discharge home Outcome: Progressing   Problem: Cardiac: Goal: LVAD will function as expected and patient will experience no clinical alarms Outcome: Progressing   Problem: Education: Goal: Knowledge of General Education information will improve Description: Including pain rating scale, medication(s)/side effects and non-pharmacologic comfort measures Outcome: Progressing   Problem: Health Behavior/Discharge Planning: Goal: Ability to manage health-related needs will improve Outcome: Progressing   Problem: Clinical Measurements: Goal: Ability to maintain clinical measurements within normal limits will improve Outcome: Progressing Goal: Will remain free from infection Outcome: Progressing Goal: Diagnostic test results will improve Outcome: Progressing Goal: Respiratory complications will improve Outcome: Progressing Goal: Cardiovascular complication will be avoided Outcome: Progressing   Problem: Activity: Goal: Risk for activity intolerance will decrease Outcome: Progressing   Problem: Nutrition: Goal: Adequate nutrition will be maintained Outcome: Progressing   Problem: Coping: Goal: Level of anxiety will decrease Outcome: Progressing   Problem: Elimination: Goal: Will not experience complications related to bowel motility Outcome: Progressing Goal: Will not experience complications related to urinary retention Outcome: Progressing   Problem: Pain Managment: Goal: General experience of comfort will improve and/or be controlled Outcome: Progressing   Problem: Safety: Goal: Ability to remain free from injury will improve Outcome: Progressing   Problem: Skin Integrity: Goal: Risk for impaired skin integrity will decrease Outcome: Progressing

## 2024-07-20 NOTE — Progress Notes (Signed)
 LVAD Coordinator Rounding Note:  Pt admitted 07/18/24 for diarrhea. Recently hospitalized for bacteremia and UTI. Completed antibiotics 07/14/24. + CDIFF this admission.   HM3 LVAD implanted on 09/17/21 by Dr Lucas under destination therapy criteria.  Pt laying in bed. Just finished getting cleaned up. States he feels his diarrhea has slowed down a little. Didn't sleep well overnight.   Currently on Fidaxomicin 200 mg BID for treatment of c-diff.   Vital signs: Temp: 98.5 HR: 77 Doppler Pressure: 78 Auto Cuff BP: 78/65 (71) O2 Sat: 95% on RA Wt: 178.7>179.9 lbs    LVAD interrogation reveals:  Speed: 5300 Flow: 4.6 Power: 3.8 w PI: 3.3 Hematocrit: 35  Alarms: none Events: none  Fixed speed: 5300 Low speed limit: 5000  Drive Line:  Dressing clean, dry, and intact. Anchor intact. Weekly dressing changes per VAD coordinator or pts wife. Next dressing change due: 07/26/24 by caregiver.   Labs:  LDH trend: 129>114  INR trend: 1.6>2.1  WBC: 12.1>12.2  Anticoagulation Plan: - INR Goal: 2-2.5  - Coumadin  dosing per pharmacy  Blood Products:  Device: - Cabin Crew - V paced 60 -Therapies: OFF  Infection:  07/18/24>> cdiff screen >> POSITIVE 07/18/24>> blood cultures >> no growth 2 days  Plan/Recommendations:  Page VAD coordinator for drive line or equipment concerns 2. Weekly dressing changes per VAD Coordinator or caregiver.  Isaiah Knoll RN VAD Coordinator  Office: [phone]  24/7 Pager: [phone]

## 2024-07-20 NOTE — Progress Notes (Signed)
 Patient woke up from a nap confused. Patient was only alert to self, believed he was at home and was unsure of why he was in the hospital after re-orienting a few different times. Patients vitals were taken with an Axillary temp of 97.4 F. PA was contacted and came bedside. Patients orientation seemed to improve overtime. Will continue to monitor.

## 2024-07-21 ENCOUNTER — Ambulatory Visit: Admitting: Urology

## 2024-07-21 DIAGNOSIS — A0472 Enterocolitis due to Clostridium difficile, not specified as recurrent: Secondary | ICD-10-CM | POA: Insufficient documentation

## 2024-07-21 DIAGNOSIS — Z95811 Presence of heart assist device: Secondary | ICD-10-CM | POA: Diagnosis not present

## 2024-07-21 DIAGNOSIS — I5022 Chronic systolic (congestive) heart failure: Secondary | ICD-10-CM | POA: Diagnosis not present

## 2024-07-21 LAB — MAGNESIUM: Magnesium: 2.3 mg/dL (ref 1.7–2.4)

## 2024-07-21 LAB — BASIC METABOLIC PANEL WITH GFR
Anion gap: 10 (ref 5–15)
BUN: 27 mg/dL — ABNORMAL HIGH (ref 8–23)
CO2: 23 mmol/L (ref 22–32)
Calcium: 8.6 mg/dL — ABNORMAL LOW (ref 8.9–10.3)
Chloride: 103 mmol/L (ref 98–111)
Creatinine, Ser: 2 mg/dL — ABNORMAL HIGH (ref 0.61–1.24)
GFR, Estimated: 35 mL/min — ABNORMAL LOW (ref >60)
Glucose, Bld: 97 mg/dL (ref 70–99)
Potassium: 3.3 mmol/L — ABNORMAL LOW (ref 3.5–5.1)
Sodium: 136 mmol/L (ref 135–145)

## 2024-07-21 LAB — CBC
HCT: 31.7 % — ABNORMAL LOW (ref 39.0–52.0)
Hemoglobin: 10.1 g/dL — ABNORMAL LOW (ref 13.0–17.0)
MCH: 25.4 pg — ABNORMAL LOW (ref 26.0–34.0)
MCHC: 31.9 g/dL (ref 30.0–36.0)
MCV: 79.6 fL — ABNORMAL LOW (ref 80.0–100.0)
Platelets: 208 K/uL (ref 150–400)
RBC: 3.98 MIL/uL — ABNORMAL LOW (ref 4.22–5.81)
RDW: 20.3 % — ABNORMAL HIGH (ref 11.5–15.5)
WBC: 8.3 K/uL (ref 4.0–10.5)
nRBC: 0 % (ref 0.0–0.2)

## 2024-07-21 LAB — PROTIME-INR
INR: 2.3 — ABNORMAL HIGH (ref 0.8–1.2)
Prothrombin Time: 26.8 s — ABNORMAL HIGH (ref 11.4–15.2)

## 2024-07-21 LAB — LACTATE DEHYDROGENASE: LDH: 123 U/L (ref 98–192)

## 2024-07-21 MED ORDER — WARFARIN SODIUM 1 MG PO TABS
1.0000 mg | ORAL_TABLET | Freq: Once | ORAL | Status: AC
  Administered 2024-07-21: 1 mg via ORAL
  Filled 2024-07-21: qty 1

## 2024-07-21 MED ORDER — POTASSIUM CHLORIDE CRYS ER 20 MEQ PO TBCR
40.0000 meq | EXTENDED_RELEASE_TABLET | Freq: Once | ORAL | Status: AC
  Administered 2024-07-21: 40 meq via ORAL
  Filled 2024-07-21: qty 2

## 2024-07-21 NOTE — Progress Notes (Signed)
 LVAD Coordinator Rounding Note:  Pt admitted 07/18/24 for diarrhea. Recently hospitalized for bacteremia and UTI. Completed antibiotics 07/14/24. + CDIFF this admission.   HM3 LVAD implanted on 09/17/21 by Dr Lucas under destination therapy criteria.  Pt laying in bed.  States he feels his diarrhea has slowed down a little. Slept better last night.  Currently on Fidaxomicin 200 mg BID for treatment of c-diff.   Vital signs: Temp: 97.8 HR: 78 Doppler Pressure: 80 Auto Cuff BP: 96/68 (78) O2 Sat: 97% on RA Wt: 178.7>179.9>179.2 lbs    LVAD interrogation reveals:  Speed: 5300 Flow: 4.7 Power: 3.8 w PI: 3.4 Hematocrit: 31  Alarms: none Events: none  Fixed speed: 5300 Low speed limit: 5000  Drive Line:  Dressing clean, dry, and intact. Anchor intact. Weekly dressing changes per VAD coordinator or pts wife. Next dressing change due: 07/26/24 by caregiver.   Labs:  LDH trend: 129>114>123  INR trend: 1.6>2.1>2.3  WBC: 12.1>12.2>8.3  Anticoagulation Plan: - INR Goal: 2-2.5  - Coumadin  dosing per pharmacy  Blood Products:  Device: - Cabin Crew - V paced 60 -Therapies: OFF  Infection:  07/18/24>> cdiff screen >> POSITIVE 07/18/24>> blood cultures >> no growth 3 days  Plan/Recommendations:  Page VAD coordinator for drive line or equipment concerns 2. Weekly dressing changes per VAD Coordinator or caregiver.  Lauraine Ip RN VAD Coordinator  Office: [phone]  24/7 Pager: [phone]

## 2024-07-21 NOTE — Plan of Care (Signed)
  Problem: Education: Goal: Patient will understand all VAD equipment and how it functions Outcome: Adequate for Discharge   Problem: Cardiac: Goal: LVAD will function as expected and patient will experience no clinical alarms Outcome: Adequate for Discharge   Problem: Education: Goal: Knowledge of General Education information will improve Description: Including pain rating scale, medication(s)/side effects and non-pharmacologic comfort measures Outcome: Progressing   Problem: Clinical Measurements: Goal: Respiratory complications will improve Outcome: Adequate for Discharge   Problem: Activity: Goal: Risk for activity intolerance will decrease Outcome: Progressing   Problem: Coping: Goal: Level of anxiety will decrease Outcome: Progressing   Problem: Elimination: Goal: Will not experience complications related to urinary retention Outcome: Progressing   Problem: Pain Managment: Goal: General experience of comfort will improve and/or be controlled Outcome: Progressing   Problem: Safety: Goal: Ability to remain free from injury will improve Outcome: Progressing   Problem: Skin Integrity: Goal: Risk for impaired skin integrity will decrease Outcome: Progressing

## 2024-07-21 NOTE — Progress Notes (Addendum)
 Advanced Heart Failure VAD Team Note  PCP-Cardiologist: Ezra Shuck, MD  Chief Complaint: C. Diff    Patient Profile:     73 y.o. male with history of ICM s/p HM3 Implant, CVA, and GIB. Recent admission this month for acute cystitis, complete abx 07/14/24. Now presenting with fever, diarrhea, and vomiting. Ruled in for C. Diff.   Subjective:    On fidaxomicin   C/w numerous BMs but slowing.   Fevers resolved. WBC normalized 12>>8K KUB yesterday w/ nonobstructive bowel gas pattern   Less abdominal pain/tenderness today. Nausea resolved. Appetite still not great.    MAP 80   INR 2.3   Wt stable. Denies dyspnea.   Frequent PVCs/ NSVT on tele, K 3.3, Mg 2.3    LVAD INTERROGATION:  HeartMate III LVAD:   Flow 4.6 liters/min, speed 5300, power 4.0, PI 3.4.  No PI events    Objective:    Vital Signs:   Temp:  [97.6 F (36.4 C)-98.6 F (37 C)] 97.8 F (36.6 C) (10/31 0835) Pulse Rate:  [58-85] 74 (10/30 2356) Resp:  [16-19] 19 (10/31 0835) BP: (84-96)/(57-78) 96/68 (10/31 0835) SpO2:  [96 %-100 %] 97 % (10/31 0835) Weight:  [81.3 kg] 81.3 kg (10/31 0450) Last BM Date : 07/20/24 Mean arterial Pressure 80  Intake/Output:   Intake/Output Summary (Last 24 hours) at 07/21/2024 0929 Last data filed at 07/21/2024 0517 Gross per 24 hour  Intake 720 ml  Output 200 ml  Net 520 ml     Physical Exam   GENERAL: no acute distress. HEENT: normal  NECK: Supple, JVP 10 cm w/ cv waves to ear  CARDIAC:  Mechanical heart sounds with LVAD hum present.  LUNGS:  Clear to auscultation bilaterally.  ABDOMEN:  Soft, round, nontender, positive bowel sounds x4.     LVAD exit site:   Dressing dry and intact.  No erythema or drainage.  Stabilization device present and accurately applied.  EXTREMITIES:  Warm and dry, no cyanosis, clubbing, rash or edema  NEUROLOGIC:  Alert and oriented x 4.  Gait steady.  No aphasia.  No dysarthria.  Affect pleasant.      Telemetry   A-V,  frequent PVCs and 1 5 beat run of NSVT, personally reviewed   EKG    N/A   Labs   Basic Metabolic Panel: Recent Labs  Lab 07/18/24 1312 07/19/24 0244 07/20/24 0351 07/21/24 0252  NA 137 136 138 136  K 3.6 4.0 3.6 3.3*  CL 105 104 105 103  CO2 23 20* 22 23  GLUCOSE 92 104* 102* 97  BUN 19 21 26* 27*  CREATININE 1.83* 2.04* 2.09* 2.00*  CALCIUM  8.8* 9.0 8.5* 8.6*  MG  --  1.5* 2.3 2.3    Liver Function Tests: Recent Labs  Lab 07/18/24 1312  AST 26  ALT 18  ALKPHOS 141*  BILITOT 1.6*  PROT 5.7*  ALBUMIN  2.5*   No results for input(s): LIPASE, AMYLASE in the last 168 hours. No results for input(s): AMMONIA in the last 168 hours.  CBC: Recent Labs  Lab 07/18/24 1312 07/19/24 0244 07/20/24 0351 07/21/24 0252  WBC 9.1 12.1* 12.2* 8.3  HGB 10.8* 11.0* 10.4* 10.1*  HCT 34.8* 35.0* 32.8* 31.7*  MCV 80.9 81.0 80.0 79.6*  PLT 274 237 211 208    INR: Recent Labs  Lab 07/17/24 0000 07/18/24 1312 07/19/24 0244 07/20/24 0351 07/21/24 0252  INR 2.0 1.5* 1.6* 2.1* 2.3*    Other results: EKG:    Imaging  DG Abd 1 View Result Date: 07/20/2024 CLINICAL DATA:  Abdominal pain. EXAM: ABDOMEN - 1 VIEW COMPARISON:  Abdominal radiograph dated 10/03/2021. FINDINGS: No bowel dilatation or evidence of obstruction. No free air or radiopaque calculi. Osteopenia with degenerative changes spine. Left ventricle assist device. No acute osseous pathology. IMPRESSION: Nonobstructive bowel gas pattern. Electronically Signed   By: Vanetta Chou M.D.   On: 07/20/2024 16:01     Medications:     Scheduled Medications:  allopurinol   50 mg Oral Daily   famotidine   40 mg Oral Daily   feeding supplement (KATE FARMS STANDARD ENT 1.4)  325 mL Oral BID BM   fidaxomicin  200 mg Oral BID   Gerhardt's butt cream   Topical TID   rosuvastatin   10 mg Oral QHS   saccharomyces boulardii  250 mg Oral BID   sildenafil   20 mg Oral TID   Warfarin - Pharmacist Dosing Inpatient    Does not apply q1600    Infusions:   PRN Medications: acetaminophen , ondansetron  (ZOFRAN ) IV    Assessment/Plan:    1. C. Diff  - P/w fever/leukocytosis - Recently hospitalized for UTI. Completed antibiotics 07/14/24. Presenting with abd pain, N/V and fever. Procal <0.1. LA nl. RUG US  negative for acute cholecystitis, does have 7mm  echogenic layering in lumen (sludge vs polyp) - + for C Diff. Treating w/ fidaxomicin - Fevers resolved. WBC normalized. Diarrhea slowing. Abdominal pain/tenderness resolved. KUB ok  2.  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 No evidence for overt bleeding. Hgb stable. - Continue Protonix  40 mg daily.  - Previously recommended that he start monthly octreotide  infusions to decrease risk for recurrent AVM bleeding.  He does not want to start this yet but is willing to start it if he has recurrent bleeding.  3. Chronic systolic CHF:  Long-standing cardiomyopathy.  [employer] dual chamber ICD.  Echo in 12/22 with EF < 20%, severe LV dilation, restrictive diastolic function, moderate RV dysfunction, moderate MR, mod-severe TR. Cause of cardiomyopathy is uncertain.  He has a history of CAD, but I do not think that the described CAD from the past could explain his cardiomyopathy, but CAD could have progressed.  He was admitted in 12/22 with cardiogenic shock and Impella 5.5. s/p HM III VAD on 09/17/22.  Post-op course complicated by AKI and RV failure. He was on HD for several months but is now off.  Echo in 7/24 showed EF < 20%, IV septum midline to slightly leftward, RV moderately dilated with moderately decreased systolic function, IVC dilated with severe TR.  Echo in 5/25 with EF 20-25%, mild LVH, LVAD present, mild RV dysfunction, moderate AI, mild RV dysfunction with normal RV size, severe TR.  NYHA class II, volume ok.  LVAD parameters reviewed today and stable, no alarms.  - hold diuretics again today w/ ongoing diarrhea  - Continue  sildenafil  20 mg tid. MAPs ok  - Continue warfarin, INR goal decreased to 2-2.5 with GI bleeding.  - INR 2.3. Warfarin dosing per PharmD  3. Tricuspid regurgitation:  Tricuspid repair not done at time of VAD due to proximity of ICD wires and hypotension during surgery. He has severe TR.  4. Atrial fibrillation: Paroxysmal.  He is off amiodarone  with resolution of nausea.  I will not restart it.  He has been a-paced recently.  5. CKD stage IV: Suspect intra-op hypotension at the time of LVAD placement led to development of ATN => urine sediment looked  like ATN per renal. Had initial CVVH and transitioned over to HD. He is now off HD.  - sCr at baseline 6. CAD: History of PCI to OM1 in 2007 and RCA in 2013.  No CP or ACs.  - Deferred cardiac cath in 12/22 due to AKI and plan for VAD - Continue Crestor .   7. Abnormal thyroid  function tests: Patient has had elevated TSH and free T4, mixed pattern. We have stopped amiodarone . 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  Most recent TSH was normal.  - He follows with endocrinology.   8. 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  INR goal increased to 2.5-3 but then decreased back to 2-2.5 with GI bleeding.  9. PVCs/NSVT: K 3.3, Mg 2.3 - give K supp      I reviewed the LVAD parameters from today, and compared the results to the patient's prior recorded data.  No programming changes were made.  The LVAD is functioning within specified parameters.  The patient performs LVAD self-test daily.  LVAD interrogation was negative for any significant power changes, alarms or PI events/speed drops.  LVAD equipment check completed and is in good working order.  Back-up equipment present.   LVAD education done on emergency procedures and precautions and reviewed exit site care.  Length of Stay: [address] Marcine RIGGERS 07/21/2024, 9:29 AM  VAD Team --- VAD ISSUES  ONLY--- Pager [phone] (7am - 7am)  Advanced Heart Failure Team  Pager [phone] (M-F; 7a - 5p) Please contact [facility] for night-coverage after hours (5p -7a ) and weekends on [email or web address]  Patient seen and examined with the above-signed Advanced Practice Provider and/or Housestaff. I personally reviewed laboratory data, imaging studies and relevant notes. I independently examined the patient and formulated the important aspects of the plan. I have edited the note to reflect any of my changes or salient points. I have personally discussed the plan with the patient and/or family.  On fidaxomicin, Fevers resolved. WBC improved. Diarrhea slowing.   Appetite still poor. Feels weak   Wife upset about not being infomred about possibility of c.diff with previous abx  General:  NAD. Lying in bed HEENT: normal  Neck: supple. JVP  elevated.  Carotids 2+ bilat; no bruits. No lymphadenopathy or thryomegaly appreciated. Cor: LVAD hum.  Lungs: Clear. Abdomen: obese soft, mildly tender, non-distended No rebound. Good bowel sounds. Driveline site clean. Anchor in place.  Extremities: no cyanosis, clubbing, rash. Warm no edema  Neuro: alert & oriented x 3. No focal deficits. Moves all 4 without problem   C.diff slowly improving. Continue fidaxomicin. Will continue.   Encouraged po intake. Will consult PT/OT   INR 2.3 Discussed warfarin dosing with PharmD personally.  VAD interrogated personally. Parameters stable.  Toribio Fuel, MD  12:07 PM

## 2024-07-21 NOTE — Progress Notes (Signed)
 PHARMACY - ANTICOAGULATION CONSULT NOTE  Pharmacy Consult for warfarin  Indication: LVAD  Allergies  Allergen Reactions   Neosporin [Neomycin-Bacitracin Zn-Polymyx] Hives   Doxycycline  Nausea And Vomiting   Mushroom Extract Complex (Obsolete) Nausea And Vomiting   Tape Other (See Comments)    Some tapes/dressings can irritate the skin    Patient Measurements: Height: 6' (182.9 cm) Weight: 81.3 kg (179 lb 3.7 oz) IBW/kg (Calculated) : 77.6 HEPARIN  DW (KG): 81.1  Vital Signs: Temp: 97.6 F (36.4 C) (10/31 0450) Temp Source: Oral (10/31 0450) BP: 95/67 (10/30 2356) Pulse Rate: 74 (10/30 2356)  Labs: Recent Labs    07/19/24 0244 07/20/24 0351 07/21/24 0252  HGB 11.0* 10.4* 10.1*  HCT 35.0* 32.8* 31.7*  PLT 237 211 208  LABPROT 20.2* 24.8* 26.8*  INR 1.6* 2.1* 2.3*  CREATININE 2.04* 2.09* 2.00*    Estimated Creatinine Clearance: 36.6 mL/min (A) (by C-G formula based on SCr of 2 mg/dL (H)).   Medical History: Past Medical History:  Diagnosis Date   Arrhythmia    atrial fibrillation   CHF (congestive heart failure) (HCC)    Chronic kidney disease    Coronary artery disease    Hyperlipidemia    Hypertension    Myocardial infarct [facility])    Assessment: [name], on chronic Coumadin . PTA warfarin dosing 2 mg daily with 4 mg on MWF.  INR increased from 2.1 to 2.3. Hgb 10.1, plt 208. LDH stable at 123. Nausea is improving but still with low appetite. KUB yesterday with non-obstructive bowel gas pattern.  Goal of Therapy:  INR 2-2.5 Monitor platelets by anticoagulation protocol: Yes   Plan:  Warfarin 1 mg po tonight Daily INR.  Thank you for allowing pharmacy to participate in this patient's care,  Suzen Sour, PharmD, BCCCP Clinical Pharmacist  Phone: [phone] 07/21/2024 7:11 AM  Please check AMION for all [facility] Pharmacy phone numbers After 10:00 PM, call Main Pharmacy [phone]   [facility] pharmacy phone numbers are listed on [email or web address]

## 2024-07-22 DIAGNOSIS — A0472 Enterocolitis due to Clostridium difficile, not specified as recurrent: Secondary | ICD-10-CM | POA: Diagnosis not present

## 2024-07-22 DIAGNOSIS — I5022 Chronic systolic (congestive) heart failure: Secondary | ICD-10-CM | POA: Diagnosis not present

## 2024-07-22 DIAGNOSIS — Z95811 Presence of heart assist device: Secondary | ICD-10-CM | POA: Diagnosis not present

## 2024-07-22 LAB — CBC
HCT: 32.5 % — ABNORMAL LOW (ref 39.0–52.0)
Hemoglobin: 10.2 g/dL — ABNORMAL LOW (ref 13.0–17.0)
MCH: 25.2 pg — ABNORMAL LOW (ref 26.0–34.0)
MCHC: 31.4 g/dL (ref 30.0–36.0)
MCV: 80.4 fL (ref 80.0–100.0)
Platelets: 227 K/uL (ref 150–400)
RBC: 4.04 MIL/uL — ABNORMAL LOW (ref 4.22–5.81)
RDW: 20.1 % — ABNORMAL HIGH (ref 11.5–15.5)
WBC: 5.7 K/uL (ref 4.0–10.5)
nRBC: 0 % (ref 0.0–0.2)

## 2024-07-22 LAB — BASIC METABOLIC PANEL WITH GFR
Anion gap: 8 (ref 5–15)
BUN: 31 mg/dL — ABNORMAL HIGH (ref 8–23)
CO2: 22 mmol/L (ref 22–32)
Calcium: 8.4 mg/dL — ABNORMAL LOW (ref 8.9–10.3)
Chloride: 105 mmol/L (ref 98–111)
Creatinine, Ser: 2.11 mg/dL — ABNORMAL HIGH (ref 0.61–1.24)
GFR, Estimated: 33 mL/min — ABNORMAL LOW (ref >60)
Glucose, Bld: 93 mg/dL (ref 70–99)
Potassium: 3.8 mmol/L (ref 3.5–5.1)
Sodium: 135 mmol/L (ref 135–145)

## 2024-07-22 LAB — PROTIME-INR
INR: 2.5 — ABNORMAL HIGH (ref 0.8–1.2)
Prothrombin Time: 28.3 s — ABNORMAL HIGH (ref 11.4–15.2)

## 2024-07-22 LAB — LACTATE DEHYDROGENASE: LDH: 118 U/L (ref 98–192)

## 2024-07-22 LAB — MAGNESIUM: Magnesium: 2.2 mg/dL (ref 1.7–2.4)

## 2024-07-22 MED ORDER — POTASSIUM CHLORIDE CRYS ER 20 MEQ PO TBCR
20.0000 meq | EXTENDED_RELEASE_TABLET | Freq: Once | ORAL | Status: AC
  Administered 2024-07-22: 20 meq via ORAL
  Filled 2024-07-22: qty 1

## 2024-07-22 MED ORDER — WARFARIN SODIUM 1 MG PO TABS
1.0000 mg | ORAL_TABLET | Freq: Once | ORAL | Status: AC
  Administered 2024-07-22: 1 mg via ORAL
  Filled 2024-07-22: qty 1

## 2024-07-22 NOTE — Evaluation (Signed)
 Physical Therapy Evaluation Patient Details Name: Isaac [name] MRN: [id number] DOB: [birth year] Today's Date: 07/22/2024  History of Present Illness  73 y.o. male admitted 07/18/24 for diarrhea. Recently hospitalized for bacteremia and UTI. Completed antibiotics 07/14/24. + C-DIFF this admission.  PMH: CAD, HTN, atrial fibrillation, and chronic systolic CHF s/p LVAD HM3.  Clinical Impression  Pt admitted with above diagnosis. Known to our service from recent admission. Pt appears to have weakened a bit and is also noticed by wife. Seems to have a little more trouble with awareness and problem solving as well. Able to ambulate with min assist for one episode of LOB but mainly at a CGA level with cues for safety. Complains of Rt ankle pain while ambulating (wife states recent flare of gout in Rt ankle - RN notified.) Able to connect LVAD controller to batteries without physical assist after cued by wife that his vest was on inside out. Pt pleasant. Had a loose BM in BSC, some incontinence noticed on bed pad as well. Assisted with peri-care. Pt and wife feel they can safely manage at home as he did following prior admission and prefer OPPT follow-up. Will continue to follow and progress acutely. Pt currently with functional limitations due to the deficits listed below (see PT Problem List). Pt will benefit from acute skilled PT to increase their independence and safety with mobility to allow discharge.           If plan is discharge home, recommend the following: A little help with walking and/or transfers;A little help with bathing/dressing/bathroom;Assistance with cooking/housework;Assist for transportation;Help with stairs or ramp for entrance;Supervision due to cognitive status   Can travel by private vehicle        Equipment Recommendations None recommended by PT  Recommendations for Other Services       Functional Status Assessment Patient has had a recent decline in their functional  status and demonstrates the ability to make significant improvements in function in a reasonable and predictable amount of time.     Precautions / Restrictions Precautions Precautions: Fall Recall of Precautions/Restrictions: Intact Precaution/Restrictions Comments: LVAD; enteric Required Braces or Orthoses: Other Brace (LVAD KIT) Restrictions Weight Bearing Restrictions Per Provider Order: No      Mobility  Bed Mobility Overal bed mobility: Needs Assistance Bed Mobility: Supine to Sit     Supine to sit: Supervision     General bed mobility comments: Extra time no assist, supervision for safety and equipment management.    Transfers Overall transfer level: Needs assistance Equipment used: None Transfers: Sit to/from Stand Sit to Stand: Supervision           General transfer comment: Supervision for safety, using back of legs to brace against bed initially. More stable on second stand from [facility]. Good control descending.    Ambulation/Gait Ambulation/Gait assistance: Min assist Gait Distance (Feet): 225 Feet Assistive device: None Gait Pattern/deviations: Step-through pattern, Decreased stride length, Drifts right/left, Staggering right Gait velocity: dec Gait velocity interpretation: <1.8 ft/sec, indicate of risk for recurrent falls   General Gait Details: Majority of distance ambulating at CGA level but one episode of LOB requiring min assist to recover. Pt looking down at ankle due to discomfort, distracted, and staggered towards Rt. Educated on awareness, safety, and equipment recs for gait. Not very open to AD use initially but will consider. SpO2 in 90s on RA.  Stairs            Wheelchair Mobility     Tilt Bed  Modified Rankin (Stroke Patients Only)       Balance Overall balance assessment: Mild deficits observed, not formally tested Sitting-balance support: No upper extremity supported, Feet supported Sitting balance-Leahy Scale: Good      Standing balance support: No upper extremity supported, During functional activity Standing balance-Leahy Scale: Fair                               Pertinent Vitals/Pain Pain Assessment Pain Assessment: Faces Faces Pain Scale: Hurts a little bit Pain Location: Rt ankle, while walking (Wife reports recent gout flare Rt ankle.) Pain Descriptors / Indicators: Aching Pain Intervention(s): Limited activity within patient's tolerance, Monitored during session, Repositioned    Home Living Family/patient expects to be discharged to:: Private residence Living Arrangements: Spouse/significant other Available Help at Discharge: Family Type of Home: House Home Access: Stairs to enter   [employer] of Steps: 1 door threshold vs step Alternate Level Stairs-Number of Steps: ~6, a landing, then additional ~6 Home Layout: Two level;Bed/bath upstairs;1/2 bath on main level Home Equipment: Rollator (4 wheels);Cane - single point Additional Comments: Has rollator and 2 SPC    Prior Function Prior Level of Function : Independent/Modified Independent             Mobility Comments: no AD use, denies falls ADLs Comments: ind     Extremity/Trunk Assessment   Upper Extremity Assessment Upper Extremity Assessment: Defer to OT evaluation    Lower Extremity Assessment Lower Extremity Assessment: Generalized weakness (Complains of Rt ankle pain/numbness (reports recent gout flare here.))       Communication   Communication Communication: No apparent difficulties    Cognition Arousal: Alert Behavior During Therapy: WFL for tasks assessed/performed   PT - Cognitive impairments: Awareness, Problem solving                       PT - Cognition Comments: Needs cues to reverse vest (inside out.) Following commands: Intact       Cueing Cueing Techniques: Verbal cues, Gestural cues     General Comments General comments (skin integrity, edema, etc.): Wife  present and supportive. MAP 65 sitting EOB via Rt brachial cuff. Denies dizziness. SpO2 mid 90s throughout on RA.    Exercises     Assessment/Plan    PT Assessment Patient needs continued PT services  PT Problem List Decreased strength;Decreased range of motion;Decreased activity tolerance;Decreased balance;Decreased mobility;Decreased cognition;Decreased knowledge of use of DME;Cardiopulmonary status limiting activity;Pain       PT Treatment Interventions DME instruction;Gait training;Stair training;Functional mobility training;Therapeutic activities;Therapeutic exercise;Balance training;Neuromuscular re-education;Patient/family education;Cognitive remediation    PT Goals (Current goals can be found in the Care Plan section)  Acute Rehab PT Goals Patient Stated Goal: Go home, no more diarrhea PT Goal Formulation: With patient/family Time For Goal Achievement: 08/05/24 Potential to Achieve Goals: Good    Frequency Min 2X/week     Co-evaluation               AM-PAC PT 6 Clicks Mobility  Outcome Measure Help needed turning from your back to your side while in a flat bed without using bedrails?: A Little Help needed moving from lying on your back to sitting on the side of a flat bed without using bedrails?: A Little Help needed moving to and from a bed to a chair (including a wheelchair)?: A Little Help needed standing up from a chair using your arms (e.g., wheelchair  or bedside chair)?: A Little Help needed to walk in hospital room?: A Little Help needed climbing 3-5 steps with a railing? : A Little 6 Click Score: 18    End of Session Equipment Utilized During Treatment: Gait belt Activity Tolerance: Patient tolerated treatment well Patient left: with call bell/phone within reach;with bed alarm set;in bed;with family/visitor present Nurse Communication: Mobility status PT Visit Diagnosis: Unsteadiness on feet (R26.81);Other abnormalities of gait and mobility  (R26.89);Muscle weakness (generalized) (M62.81);Other symptoms and signs involving the nervous system (R29.898);Difficulty in walking, not elsewhere classified (R26.2);Pain Pain - Right/Left: Right Pain - part of body: Ankle and joints of foot    Time: 1435-1511 PT Time Calculation (min) (ACUTE ONLY): 36 min   Charges:   PT Evaluation $PT Eval Low Complexity: 1 Low PT Treatments $Gait Training: 8-22 mins PT General Charges $$ ACUTE PT VISIT: 1 Visit         Isaac [name], PT, DPT [facility]  Rehabilitation Services Physical Therapist Office: [phone] Website: [email or web address]   Isaac [name] 07/22/2024, 3:47 PM

## 2024-07-22 NOTE — Progress Notes (Signed)
 OT Cancellation Note  Patient Details Name: Isaac [name] MRN: [id number] DOB: 05/22/1951   Cancelled Treatment:    Reason Eval/Treat Not Completed: Other (comment) (pt just finished working with PT, set up in bed to eat. will follow up for OT eval as schedule permits)  [name], OTD, OTR/L SecureChat Preferred Acute Rehab [phone]   [name] 07/22/2024, 3:18 PM

## 2024-07-22 NOTE — Progress Notes (Signed)
 PHARMACY - ANTICOAGULATION CONSULT NOTE  Pharmacy Consult for warfarin  Indication: LVAD  Allergies  Allergen Reactions   Neosporin [Neomycin-Bacitracin Zn-Polymyx] Hives   Doxycycline  Nausea And Vomiting   Mushroom Extract Complex (Obsolete) Nausea And Vomiting   Tape Other (See Comments)    Some tapes/dressings can irritate the skin    Patient Measurements: Height: 6' (182.9 cm) Weight: 76.4 kg (168 lb 6.9 oz) IBW/kg (Calculated) : 77.6 HEPARIN  DW (KG): 81.1  Vital Signs: Temp: 97.8 F (36.6 C) (11/01 1119) Temp Source: Oral (11/01 1119) BP: 83/70 (11/01 1119) Pulse Rate: 65 (11/01 1119)  Labs: Recent Labs    07/20/24 0351 07/21/24 0252 07/22/24 0231  HGB 10.4* 10.1* 10.2*  HCT 32.8* 31.7* 32.5*  PLT 211 208 227  LABPROT 24.8* 26.8* 28.3*  INR 2.1* 2.3* 2.5*  CREATININE 2.09* 2.00* 2.11*    Estimated Creatinine Clearance: 34.2 mL/min (A) (by C-G formula based on SCr of 2.11 mg/dL (H)).   Medical History: Past Medical History:  Diagnosis Date   Arrhythmia    atrial fibrillation   CHF (congestive heart failure) (HCC)    Chronic kidney disease    Coronary artery disease    Hyperlipidemia    Hypertension    Myocardial infarct [facility])    Assessment: [name], on chronic Coumadin . PTA warfarin dosing 2 mg daily with 4 mg on MWF.  INR increased from 2.3 to 2.5. Hgb 10.2, plt 227. LDH stable at 118.   Goal of Therapy:  INR 2-2.5 Monitor platelets by anticoagulation protocol: Yes   Plan:  Warfarin 1 mg po tonight Daily INR.  Thank you for allowing pharmacy to participate in this patient's care,  Prentice Poisson, PharmD Clinical Pharmacist **Pharmacist phone directory can now be found on [email or web address] ([email or web address]).  Listed under [facility].

## 2024-07-22 NOTE — Plan of Care (Signed)
  Problem: Education: Goal: Patient will understand all VAD equipment and how it functions Outcome: Progressing   Problem: Cardiac: Goal: LVAD will function as expected and patient will experience no clinical alarms Outcome: Progressing   Problem: Education: Goal: Knowledge of General Education information will improve Description: Including pain rating scale, medication(s)/side effects and non-pharmacologic comfort measures Outcome: Progressing

## 2024-07-22 NOTE — Progress Notes (Signed)
 Advanced Heart Failure VAD Team Note  PCP-Cardiologist: Ezra Shuck, MD  Chief Complaint: C. Diff    Patient Profile:     73 y.o. male with history of ICM s/p HM3 Implant, CVA, and GIB. Recent admission this month for acute cystitis, complete abx 07/14/24. Now presenting with fever, diarrhea, and vomiting. Ruled in for C. Diff.   Subjective:    On fidaxomicin   2 bowel movements yesterday, still with poor appetite, but overall slowly improving.   LVAD INTERROGATION:  HeartMate III LVAD:   Flow 4.6 liters/min, speed 5300, power 3.0, PI 3.8.  Rare PI events    Objective:    Vital Signs:   Temp:  [97.6 F (36.4 C)-98.4 F (36.9 C)] 97.8 F (36.6 C) (11/01 1119) Pulse Rate:  [65-99] 65 (11/01 1119) Resp:  [15-19] 16 (11/01 1119) BP: (83-155)/(58-135) 83/70 (11/01 1119) SpO2:  [95 %-97 %] 95 % (11/01 0724) Weight:  [76.4 kg] 76.4 kg (11/01 0402) Last BM Date : 07/20/24 Mean arterial Pressure 80  Intake/Output:   Intake/Output Summary (Last 24 hours) at 07/22/2024 1352 Last data filed at 07/22/2024 0300 Gross per 24 hour  Intake 240 ml  Output 250 ml  Net -10 ml     Physical Exam   General:  Well appearing. No resp difficulty Cor: Mechanical heart sounds with LVAD hum present. JVP flat, No edema Lungs: Normal WOB Abdomen: soft, mildly tender, nondistended Driveline: C/D/I; securement device intact and driveline incorporated Neuro: alert & orientedx3, cranial nerves grossly intact. moves all 4 extremities w/o difficulty.      EKG    N/A   Labs   Basic Metabolic Panel: Recent Labs  Lab 07/18/24 1312 07/19/24 0244 07/20/24 0351 07/21/24 0252 07/22/24 0231  NA 137 136 138 136 135  K 3.6 4.0 3.6 3.3* 3.8  CL 105 104 105 103 105  CO2 23 20* 22 23 22   GLUCOSE 92 104* 102* 97 93  BUN 19 21 26* 27* 31*  CREATININE 1.83* 2.04* 2.09* 2.00* 2.11*  CALCIUM  8.8* 9.0 8.5* 8.6* 8.4*  MG  --  1.5* 2.3 2.3 2.2    Liver Function Tests: Recent Labs  Lab  07/18/24 1312  AST 26  ALT 18  ALKPHOS 141*  BILITOT 1.6*  PROT 5.7*  ALBUMIN  2.5*   No results for input(s): LIPASE, AMYLASE in the last 168 hours. No results for input(s): AMMONIA in the last 168 hours.  CBC: Recent Labs  Lab 07/18/24 1312 07/19/24 0244 07/20/24 0351 07/21/24 0252 07/22/24 0231  WBC 9.1 12.1* 12.2* 8.3 5.7  HGB 10.8* 11.0* 10.4* 10.1* 10.2*  HCT 34.8* 35.0* 32.8* 31.7* 32.5*  MCV 80.9 81.0 80.0 79.6* 80.4  PLT 274 237 211 208 227    INR: Recent Labs  Lab 07/18/24 1312 07/19/24 0244 07/20/24 0351 07/21/24 0252 07/22/24 0231  INR 1.5* 1.6* 2.1* 2.3* 2.5*    Other results: EKG:     Medications:     Scheduled Medications:  allopurinol   50 mg Oral Daily   famotidine   40 mg Oral Daily   feeding supplement (KATE FARMS STANDARD ENT 1.4)  325 mL Oral BID BM   fidaxomicin  200 mg Oral BID   Gerhardt's butt cream   Topical TID   rosuvastatin   10 mg Oral QHS   saccharomyces boulardii  250 mg Oral BID   sildenafil   20 mg Oral TID   warfarin  1 mg Oral ONCE-1600   Warfarin - Pharmacist Dosing Inpatient   Does  not apply q1600    Infusions:   PRN Medications: acetaminophen , ondansetron  (ZOFRAN ) IV    Assessment/Plan:    1. C. Diff  - P/w fever/leukocytosis - Recently hospitalized for UTI. Completed antibiotics 07/14/24. Presenting with abd pain, N/V and fever. - + for C Diff. Treating w/ fidaxomicin - WBC downtrending, slowly improving but still with poor appetite. Hopefully can transition to discharge soon  2.  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 No evidence for overt bleeding. Hgb stable. - Continue Protonix  40 mg daily.  - Previously recommended that he start monthly octreotide  infusions to decrease risk for recurrent AVM bleeding.  He does not want to start this yet but is willing to start it if he has recurrent bleeding.   3. Chronic systolic CHF:  Long-standing cardiomyopathy. s/p HM III VAD on  09/17/22.   - Appears euvolemic today, holding diuretics again today - Continue sildenafil  20 mg tid. MAPs ok  - Continue warfarin, INR goal decreased to 2-2.5 with GI bleeding.  - INR 2.3. Warfarin dosing per PharmD   3. Tricuspid regurgitation:  Tricuspid repair not done at time of VAD due to proximity of ICD wires and hypotension during surgery. He has severe TR.   4. Atrial fibrillation: Paroxysmal.  He is off amiodarone  and his nausea improved. I will not restart it.  He has been a-paced recently.   5. CKD stage IV: Suspect intra-op hypotension at the time of LVAD placement led to development of ATN => urine sediment looked like ATN per renal. Had initial CVVH and transitioned over to HD. He is now off HD.  - sCr at baseline  6. CAD: History of PCI to OM1 in 2007 and RCA in 2013.  No CP or ACs.  - Deferred cardiac cath in 12/22 due to AKI and plan for VAD - Continue Crestor .    7. Abnormal thyroid  function tests: Patient has had elevated TSH and free T4, mixed pattern. We have stopped amiodarone . 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  Most recent TSH was normal.  - He follows with endocrinology.    8. 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  INR goal increased to 2.5-3 but then decreased back to 2-2.5 with GI bleeding.   9. PVCs/NSVT: Improved - Kcl supplmentation     I reviewed the LVAD parameters from today, and compared the results to the patient's prior recorded data.  No programming changes were made.  The LVAD is functioning within specified parameters.  The patient performs LVAD self-test daily.  LVAD interrogation was negative for any significant power changes, alarms or PI events/speed drops.  LVAD equipment check completed and is in good working order.  Back-up equipment present.   LVAD education done on emergency procedures and precautions and reviewed exit site care.  Length of  Stay: 4  Morene JINNY Brownie, MD 07/22/2024, 1:52 PM  VAD Team --- VAD ISSUES ONLY--- Pager [phone] (7am - 7am)  Advanced Heart Failure Team  Pager [phone] (M-F; 7a - 5p) Please contact [facility] for night-coverage after hours (5p -7a ) and weekends on [email or web address]

## 2024-07-23 DIAGNOSIS — I5022 Chronic systolic (congestive) heart failure: Secondary | ICD-10-CM | POA: Diagnosis not present

## 2024-07-23 DIAGNOSIS — Z95811 Presence of heart assist device: Secondary | ICD-10-CM | POA: Diagnosis not present

## 2024-07-23 LAB — CBC
HCT: 31.7 % — ABNORMAL LOW (ref 39.0–52.0)
Hemoglobin: 9.8 g/dL — ABNORMAL LOW (ref 13.0–17.0)
MCH: 24.7 pg — ABNORMAL LOW (ref 26.0–34.0)
MCHC: 30.9 g/dL (ref 30.0–36.0)
MCV: 80.1 fL (ref 80.0–100.0)
Platelets: 230 K/uL (ref 150–400)
RBC: 3.96 MIL/uL — ABNORMAL LOW (ref 4.22–5.81)
RDW: 20.1 % — ABNORMAL HIGH (ref 11.5–15.5)
WBC: 4.2 K/uL (ref 4.0–10.5)
nRBC: 0 % (ref 0.0–0.2)

## 2024-07-23 LAB — LACTATE DEHYDROGENASE: LDH: 114 U/L (ref 98–192)

## 2024-07-23 LAB — BASIC METABOLIC PANEL WITH GFR
Anion gap: 7 (ref 5–15)
BUN: 29 mg/dL — ABNORMAL HIGH (ref 8–23)
CO2: 22 mmol/L (ref 22–32)
Calcium: 8.3 mg/dL — ABNORMAL LOW (ref 8.9–10.3)
Chloride: 107 mmol/L (ref 98–111)
Creatinine, Ser: 1.75 mg/dL — ABNORMAL HIGH (ref 0.61–1.24)
GFR, Estimated: 41 mL/min — ABNORMAL LOW (ref >60)
Glucose, Bld: 106 mg/dL — ABNORMAL HIGH (ref 70–99)
Potassium: 4.1 mmol/L (ref 3.5–5.1)
Sodium: 136 mmol/L (ref 135–145)

## 2024-07-23 LAB — CULTURE, BLOOD (SINGLE)
Culture: NO GROWTH
Culture: NO GROWTH
Special Requests: ADEQUATE
Special Requests: ADEQUATE

## 2024-07-23 LAB — STOOL CULTURE REFLEX - RSASHR

## 2024-07-23 LAB — STOOL CULTURE: E coli, Shiga toxin Assay: NEGATIVE

## 2024-07-23 LAB — PROTIME-INR
INR: 2.2 — ABNORMAL HIGH (ref 0.8–1.2)
Prothrombin Time: 25.5 s — ABNORMAL HIGH (ref 11.4–15.2)

## 2024-07-23 LAB — MAGNESIUM: Magnesium: 2 mg/dL (ref 1.7–2.4)

## 2024-07-23 LAB — STOOL CULTURE REFLEX - CMPCXR

## 2024-07-23 MED ORDER — FUROSEMIDE 40 MG PO TABS
60.0000 mg | ORAL_TABLET | Freq: Every day | ORAL | Status: DC
  Administered 2024-07-23 – 2024-07-25 (×3): 60 mg via ORAL
  Filled 2024-07-23 (×3): qty 1

## 2024-07-23 MED ORDER — POTASSIUM CHLORIDE CRYS ER 20 MEQ PO TBCR
20.0000 meq | EXTENDED_RELEASE_TABLET | Freq: Every day | ORAL | Status: DC
  Administered 2024-07-23 – 2024-07-25 (×3): 20 meq via ORAL
  Filled 2024-07-23 (×3): qty 1

## 2024-07-23 MED ORDER — WARFARIN SODIUM 2.5 MG PO TABS
2.5000 mg | ORAL_TABLET | Freq: Once | ORAL | Status: AC
  Administered 2024-07-23: 2.5 mg via ORAL
  Filled 2024-07-23: qty 1

## 2024-07-23 NOTE — Plan of Care (Signed)
  Problem: Education: Goal: Patient will understand all VAD equipment and how it functions Outcome: Progressing   Problem: Cardiac: Goal: LVAD will function as expected and patient will experience no clinical alarms Outcome: Progressing   Problem: Education: Goal: Knowledge of General Education information will improve Description: Including pain rating scale, medication(s)/side effects and non-pharmacologic comfort measures Outcome: Progressing   Problem: Health Behavior/Discharge Planning: Goal: Ability to manage health-related needs will improve Outcome: Progressing

## 2024-07-23 NOTE — Progress Notes (Signed)
 Patient ID: Isaac [name], male   DOB: [birth year], 73 y.o.   MRN: [id number]   Advanced Heart Failure VAD Team Note  PCP-Cardiologist: Ezra Shuck, MD  Chief Complaint: C. Diff    Patient Profile:     73 y.o. male with history of ICM s/p HM3 Implant, CVA, and GIB. Recent admission this month for acute cystitis, complete abx 07/14/24. Now presenting with fever, diarrhea, and vomiting. Ruled in for C. Diff.   Subjective:    On fidaxomicin   Still having several bowel movements/day but more formed.  No abdominal pain.  Appetite picking up.  Wife says that he is confused when he first wakes up but gets better with time.   Creatinine 1.75.   LVAD INTERROGATION:  HeartMate III LVAD:   Flow 4.6 liters/min, speed 5300, power 4, PI 3.1.    Objective:    Vital Signs:   Temp:  [97.8 F (36.6 C)-98.6 F (37 C)] 97.8 F (36.6 C) (11/02 1101) Pulse Rate:  [61-83] 73 (11/02 1101) Resp:  [16-20] 20 (11/02 1101) BP: (84-95)/(68-78) 86/72 (11/02 1101) SpO2:  [93 %-97 %] 97 % (11/02 1101) Weight:  [76.6 kg] 76.6 kg (11/02 0418) Last BM Date : 07/20/24 Mean arterial Pressure 80s  Intake/Output:   Intake/Output Summary (Last 24 hours) at 07/23/2024 1304 Last data filed at 07/22/2024 1537 Gross per 24 hour  Intake --  Output 250 ml  Net -250 ml     Physical Exam   General: Well appearing this am. NAD.  HEENT: Normal. Neck: Supple, JVP 9-10 with prominent CV waves cm. Carotids OK.  Cardiac:  Mechanical heart sounds with LVAD hum present.  Lungs:  CTAB, normal effort.  Abdomen:  NT, ND, no HSM. No bruits or masses. +BS  LVAD exit site: Well-healed and incorporated. Dressing dry and intact. No erythema or drainage. Stabilization device present and accurately applied. Driveline dressing changed daily per sterile technique. Extremities:  Warm and dry. No cyanosis, clubbing, rash, or edema.  Neuro:  Alert & oriented x 3. Cranial nerves grossly intact. Moves all 4 extremities w/o  difficulty. Affect pleasant     EKG    N/A   Labs   Basic Metabolic Panel: Recent Labs  Lab 07/19/24 0244 07/20/24 0351 07/21/24 0252 07/22/24 0231 07/23/24 0309  NA 136 138 136 135 136  K 4.0 3.6 3.3* 3.8 4.1  CL 104 105 103 105 107  CO2 20* 22 23 22 22   GLUCOSE 104* 102* 97 93 106*  BUN 21 26* 27* 31* 29*  CREATININE 2.04* 2.09* 2.00* 2.11* 1.75*  CALCIUM  9.0 8.5* 8.6* 8.4* 8.3*  MG 1.5* 2.3 2.3 2.2 2.0    Liver Function Tests: Recent Labs  Lab 07/18/24 1312  AST 26  ALT 18  ALKPHOS 141*  BILITOT 1.6*  PROT 5.7*  ALBUMIN  2.5*   No results for input(s): LIPASE, AMYLASE in the last 168 hours. No results for input(s): AMMONIA in the last 168 hours.  CBC: Recent Labs  Lab 07/19/24 0244 07/20/24 0351 07/21/24 0252 07/22/24 0231 07/23/24 0309  WBC 12.1* 12.2* 8.3 5.7 4.2  HGB 11.0* 10.4* 10.1* 10.2* 9.8*  HCT 35.0* 32.8* 31.7* 32.5* 31.7*  MCV 81.0 80.0 79.6* 80.4 80.1  PLT 237 211 208 227 230    INR: Recent Labs  Lab 07/19/24 0244 07/20/24 0351 07/21/24 0252 07/22/24 0231 07/23/24 0309  INR 1.6* 2.1* 2.3* 2.5* 2.2*    Other results: EKG:     Medications:  Scheduled Medications:  allopurinol   50 mg Oral Daily   famotidine   40 mg Oral Daily   feeding supplement (KATE FARMS STANDARD ENT 1.4)  325 mL Oral BID BM   fidaxomicin  200 mg Oral BID   Gerhardt's butt cream   Topical TID   rosuvastatin   10 mg Oral QHS   saccharomyces boulardii  250 mg Oral BID   sildenafil   20 mg Oral TID   warfarin  2.5 mg Oral ONCE-1600   Warfarin - Pharmacist Dosing Inpatient   Does not apply q1600    Infusions:   PRN Medications: acetaminophen , ondansetron  (ZOFRAN ) IV    Assessment/Plan:    1. C. Diff  - P/w fever/leukocytosis - Recently hospitalized for UTI. Completed antibiotics 07/14/24. Presenting with abd pain, N/V and fever. - + for C Diff. Treating w/ fidaxomicin - WBC downtrending, still with frequent BMs but improving and  more formed.   2.  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 No evidence for overt bleeding. Hgb stable. - Continue Protonix  40 mg daily.  - Previously recommended that he start monthly octreotide  infusions to decrease risk for recurrent AVM bleeding.  He does not want to start this yet but is willing to start it if he has recurrent bleeding.   3. Chronic systolic CHF:  Long-standing cardiomyopathy. s/p HM III VAD on 09/17/22.   - Continue sildenafil  20 mg tid. MAPs ok  - Continue warfarin, INR goal decreased to 2-2.5 with GI bleeding.  - INR 2.2. Warfarin dosing per PharmD  - Restart home Lasix  60 mg po daily and KCl 20 daily.   3. Tricuspid regurgitation:  Tricuspid repair not done at time of VAD due to proximity of ICD wires and hypotension during surgery. He has severe TR.   4. Atrial fibrillation: Paroxysmal.  He is off amiodarone  and his nausea improved. I will not restart it.  He has been a-paced recently.   5. CKD stage IV: Suspect intra-op hypotension at the time of LVAD placement led to development of ATN => urine sediment looked like ATN per renal. Had initial CVVH and transitioned over to HD. He is now off HD.  - sCr at baseline  6. CAD: History of PCI to OM1 in 2007 and RCA in 2013.  No CP or ACs.  - Deferred cardiac cath in 12/22 due to AKI and plan for VAD - Continue Crestor .    7. Abnormal thyroid  function tests: Patient has had elevated TSH and free T4, mixed pattern. We have stopped amiodarone . 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  Most recent TSH was normal.  - He follows with endocrinology.    8. 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  INR goal increased to 2.5-3 but then decreased back to 2-2.5 with GI bleeding.   9. PVCs/NSVT: Improved  10. Delirium: Confusion in am, resolves later in day.  He is alert/oriented for me currently.   Aim for home  tomorrow if diarrhea continues to improve.    I reviewed the LVAD parameters from today, and compared the results to the patient's prior recorded data.  No programming changes were made.  The LVAD is functioning within specified parameters.  The patient performs LVAD self-test daily.  LVAD interrogation was negative for any significant power changes, alarms or PI events/speed drops.  LVAD equipment check completed and is in good working order.  Back-up equipment present.   LVAD  education done on emergency procedures and precautions and reviewed exit site care.  Length of Stay: 5  Ezra Shuck, MD 07/23/2024, 1:04 PM  VAD Team --- VAD ISSUES ONLY--- Pager [phone] (7am - 7am)  Advanced Heart Failure Team  Pager [phone] (M-F; 7a - 5p) Please contact [facility] for night-coverage after hours (5p -7a ) and weekends on [email or web address]

## 2024-07-23 NOTE — Evaluation (Addendum)
 Occupational Therapy Evaluation Patient Details Name: Isaac [name] MRN: [id number] DOB: [birth year] Today's Date: 07/23/2024   History of Present Illness   73 y.o. male admitted 07/18/24 for diarrhea. Recently hospitalized for bacteremia and UTI. Completed antibiotics 07/14/24. + C-DIFF this admission.  PMH: CAD, HTN, atrial fibrillation, and chronic systolic CHF s/p LVAD HM3.     Clinical Impressions Pt ind at baseline with ADLs/functional mobility, lives with spouse who can assist. Pt currently needs up to min A for ADLs, CGA for bed mobility and CGA for transfers without AD. Pt with mild unsteadiness due to R foot/gout pain. Pt switches from wall <> battery power with min A and incr time. Pt presenting with impairments listed below, will follow acutely. Anticipate no OT follow up needs at d/c.   PS 5300 PF 4.7 PI 3.0 PP 3.9 No alarming of system noted during session     If plan is discharge home, recommend the following:   A little help with walking and/or transfers;A little help with bathing/dressing/bathroom;Assistance with cooking/housework;Direct supervision/assist for financial management;Direct supervision/assist for medications management;Assist for transportation;Help with stairs or ramp for entrance     Functional Status Assessment   Patient has had a recent decline in their functional status and demonstrates the ability to make significant improvements in function in a reasonable and predictable amount of time.     Equipment Recommendations   None recommended by OT     Recommendations for Other Services   PT consult     Precautions/Restrictions   Precautions Precautions: Fall Recall of Precautions/Restrictions: Intact Precaution/Restrictions Comments: LVAD; enteric Restrictions Weight Bearing Restrictions Per Provider Order: No     Mobility Bed Mobility Overal bed mobility: Needs Assistance Bed Mobility: Sit to Supine, Supine to Sit      Supine to sit: Contact guard Sit to supine: Contact guard assist        Transfers Overall transfer level: Needs assistance Equipment used: None Transfers: Sit to/from Stand Sit to Stand: Contact guard assist                  Balance Overall balance assessment: Mild deficits observed, not formally tested                                         ADL either performed or assessed with clinical judgement   ADL Overall ADL's : Needs assistance/impaired Eating/Feeding: Set up   Grooming: Set up   Upper Body Bathing: Minimal assistance   Lower Body Bathing: Minimal assistance   Upper Body Dressing : Minimal assistance   Lower Body Dressing: Minimal assistance   Toilet Transfer: Contact guard assist;Ambulation   Toileting- Clothing Manipulation and Hygiene: Contact guard assist       Functional mobility during ADLs: Contact guard assist       Vision Baseline Vision/History: 1 Wears glasses Vision Assessment?: No apparent visual deficits     Perception Perception: Not tested       Praxis Praxis: Not tested       Pertinent Vitals/Pain Pain Assessment Pain Assessment: No/denies pain     Extremity/Trunk Assessment Upper Extremity Assessment Upper Extremity Assessment: Generalized weakness   Lower Extremity Assessment Lower Extremity Assessment: Defer to PT evaluation   Cervical / Trunk Assessment Cervical / Trunk Assessment: Normal   Communication Communication Communication: No apparent difficulties   Cognition Arousal: Alert Behavior During Therapy: [facility] for tasks assessed/performed  Cognition: Cognition impaired   Orientation impairments: Situation Awareness: Online awareness impaired                         Following commands: Intact       Cueing  General Comments   Cueing Techniques: Verbal cues;Gestural cues  VSS   Exercises     Shoulder Instructions      Home Living Family/patient expects to be  discharged to:: Private residence Living Arrangements: Spouse/significant other Available Help at Discharge: Family Type of Home: House Home Access: Stairs to enter [employer] of Steps: 1 door threshold vs step   Home Layout: Two level;Bed/bath upstairs;1/2 bath on main level Alternate Level Stairs-Number of Steps: ~6, a landing, then additional ~6 Alternate Level Stairs-Rails: Left Bathroom Shower/Tub: Producer, Television/film/video: Standard     Home Equipment: Rollator (4 wheels);Cane - single point          Prior Functioning/Environment Prior Level of Function : Independent/Modified Independent             Mobility Comments: no AD use, denies falls ADLs Comments: ind    OT Problem List: Decreased strength;Decreased range of motion;Decreased activity tolerance;Impaired balance (sitting and/or standing)   OT Treatment/Interventions: Self-care/ADL training;Therapeutic exercise;Energy conservation;DME and/or AE instruction;Therapeutic activities;Patient/family education;Balance training      OT Goals(Current goals can be found in the care plan section)   Acute Rehab OT Goals Patient Stated Goal: none stated OT Goal Formulation: With patient Time For Goal Achievement: 08/06/24 Potential to Achieve Goals: Good ADL Goals Pt Will Perform Upper Body Dressing: with modified independence;sitting Pt Will Perform Lower Body Dressing: with modified independence;sitting/lateral leans;sit to/from stand Pt Will Transfer to Toilet: with modified independence;ambulating;regular height toilet   OT Frequency:  Min 2X/week    Co-evaluation              AM-PAC OT 6 Clicks Daily Activity     Outcome Measure Help from another person eating meals?: A Little Help from another person taking care of personal grooming?: A Little Help from another person toileting, which includes using toliet, bedpan, or urinal?: A Little Help from another person bathing  (including washing, rinsing, drying)?: A Little Help from another person to put on and taking off regular upper body clothing?: A Little Help from another person to put on and taking off regular lower body clothing?: A Little 6 Click Score: 18   End of Session Nurse Communication: Mobility status  Activity Tolerance: Patient tolerated treatment well Patient left: in bed;with call bell/phone within reach;with bed alarm set;with family/visitor present  OT Visit Diagnosis: Unsteadiness on feet (R26.81);Other abnormalities of gait and mobility (R26.89);Muscle weakness (generalized) (M62.81)                Time: 8696-8659 OT Time Calculation (min): 37 min Charges:  OT General Charges $OT Visit: 1 Visit OT Evaluation $OT Eval Moderate Complexity: 1 Mod OT Treatments $Self Care/Home Management : 8-22 mins  Malini Flemings K, OTD, OTR/L SecureChat Preferred Acute Rehab [phone]   Laneta POUR Koonce 07/23/2024, 1:48 PM

## 2024-07-24 DIAGNOSIS — A0472 Enterocolitis due to Clostridium difficile, not specified as recurrent: Secondary | ICD-10-CM | POA: Diagnosis not present

## 2024-07-24 DIAGNOSIS — Z95811 Presence of heart assist device: Secondary | ICD-10-CM | POA: Diagnosis not present

## 2024-07-24 DIAGNOSIS — I5022 Chronic systolic (congestive) heart failure: Secondary | ICD-10-CM | POA: Diagnosis not present

## 2024-07-24 LAB — LACTATE DEHYDROGENASE: LDH: 140 U/L (ref 98–192)

## 2024-07-24 LAB — PROTIME-INR
INR: 2.1 — ABNORMAL HIGH (ref 0.8–1.2)
Prothrombin Time: 24.6 s — ABNORMAL HIGH (ref 11.4–15.2)

## 2024-07-24 LAB — MAGNESIUM: Magnesium: 1.9 mg/dL (ref 1.7–2.4)

## 2024-07-24 MED ORDER — WARFARIN SODIUM 2.5 MG PO TABS
2.5000 mg | ORAL_TABLET | Freq: Once | ORAL | Status: AC
  Administered 2024-07-24: 2.5 mg via ORAL
  Filled 2024-07-24: qty 1

## 2024-07-24 NOTE — TOC Transition Note (Signed)
 Transition of Care [facility]) - Discharge Note   Patient Details  Name: Isaac [name] MRN: [id number] Date of Birth: 06/15/1951  Transition of Care [facility]) CM/SW Contact:  [name] Phone Number: [phone] 07/24/2024, 11:54 AM   Clinical Narrative:   [facility] CSW called to schedule patients hospital f/u appt. CSW spoke with receptionist, Marquita who stated that their schedule is not in at the moment and will contact the patient directly to schedule.       Barriers to Discharge: Continued Medical Work up   Patient Goals and CMS Choice Patient states their goals for this hospitalization and ongoing recovery are:: wants to get better          Discharge Placement                       Discharge Plan and Services Additional resources added to the After Visit Summary for     Discharge Planning Services: CM Consult                                 Social Drivers of Health (SDOH) Interventions SDOH Screenings   Food Insecurity: No Food Insecurity (07/18/2024)  Housing: Low Risk  (07/18/2024)  Transportation Needs: No Transportation Needs (07/18/2024)  Utilities: Not At Risk (07/18/2024)  Depression (PHQ2-9): Low Risk  (04/14/2022)  Social Connections: Unknown (07/18/2024)  Tobacco Use: Medium Risk (07/19/2024)     Readmission Risk Interventions     No data to display

## 2024-07-24 NOTE — Progress Notes (Signed)
 PHARMACY - ANTICOAGULATION CONSULT NOTE  Pharmacy Consult for warfarin  Indication: LVAD  Allergies  Allergen Reactions   Neosporin [Neomycin-Bacitracin Zn-Polymyx] Hives   Doxycycline  Nausea And Vomiting   Mushroom Extract Complex (Obsolete) Nausea And Vomiting   Tape Other (See Comments)    Some tapes/dressings can irritate the skin    Patient Measurements: Height: 6' (182.9 cm) Weight: 81 kg (178 lb 9.2 oz) IBW/kg (Calculated) : 77.6 HEPARIN  DW (KG): 81.1  Vital Signs: Temp: 98.2 F (36.8 C) (11/03 1701) Temp Source: Oral (11/03 1701) BP: 88/62 (11/03 1701) Pulse Rate: 71 (11/03 1701)  Labs: Recent Labs    07/22/24 0231 07/23/24 0309 07/24/24 0245  HGB 10.2* 9.8*  --   HCT 32.5* 31.7*  --   PLT 227 230  --   LABPROT 28.3* 25.5* 24.6*  INR 2.5* 2.2* 2.1*  CREATININE 2.11* 1.75*  --     Estimated Creatinine Clearance: 41.9 mL/min (A) (by C-G formula based on SCr of 1.75 mg/dL (H)).   Medical History: Past Medical History:  Diagnosis Date   Arrhythmia    atrial fibrillation   CHF (congestive heart failure) (HCC)    Chronic kidney disease    Coronary artery disease    Hyperlipidemia    Hypertension    Myocardial infarct [facility])    Assessment: [name], on chronic Coumadin . PTA warfarin dosing 2 mg daily with 4 mg on MWF.  INR at goal 2.1 hgb 10 stable, plt 200s stable  LDH stable at 140 stable   Goal of Therapy:  INR 2-2.5 Monitor platelets by anticoagulation protocol: Yes   Plan:  Warfarin 2.5mg  po tonight Daily INR and cbc   Olam Chalk Pharm.D. CPP, BCPS Clinical Pharmacist [phone] 07/24/2024 5:19 PM

## 2024-07-24 NOTE — Progress Notes (Addendum)
 Patient ID: Isaac [name], male   DOB: Feb 08, 1951, 73 y.o.   MRN: [id number]   Advanced Heart Failure VAD Team Note  PCP-Cardiologist: Ezra Shuck, MD  Chief Complaint: C. Diff  Patient Profile:     73 y.o. male with history of ICM s/p HM3 Implant, CVA, and GIB. Recent admission this month for acute cystitis, complete abx 07/14/24. Now presenting with fever, diarrhea, and vomiting. Ruled in for C. Diff.   Subjective:    On fidaxomicin.   Still having BM's. No shortness of breath. Afebrile.   Creatinine 1.75.   LVAD INTERROGATION:  HeartMate III LVAD:   Flow 4.8 liters/min, speed 5300, power 3.8, PI 3.4    Objective:    Vital Signs:   Temp:  [97.6 F (36.4 C)-98.6 F (37 C)] 98.6 F (37 C) (11/03 1155) Pulse Rate:  [69-90] 69 (11/03 0401) Resp:  [18-19] 19 (11/03 1155) BP: (89-97)/(62-79) 91/76 (11/03 1200) SpO2:  [94 %-96 %] 94 % (11/03 1155) Weight:  [81 kg] 81 kg (11/03 0401) Last BM Date : 07/23/24  Mean arterial Pressure 80s  Intake/Output:  Intake/Output Summary (Last 24 hours) at 07/24/2024 1510 Last data filed at 07/24/2024 1300 Gross per 24 hour  Intake 560 ml  Output 1900 ml  Net -1340 ml    Physical Exam   General: Well appearing. No distress  Cardiac: JVP ~8cm with TR. S1 and S2 present. No murmurs. Resp: Lung sounds clear and equal B/L Extremities: Warm and dry. No peripheral edema.  Neuro: Alert and oriented x3. Affect pleasant.   EKG    AP 70s (personally reviewed)  Labs   Basic Metabolic Panel: Recent Labs  Lab 07/19/24 0244 07/20/24 0351 07/21/24 0252 07/22/24 0231 07/23/24 0309 07/24/24 0245  NA 136 138 136 135 136  --   K 4.0 3.6 3.3* 3.8 4.1  --   CL 104 105 103 105 107  --   CO2 20* 22 23 22 22   --   GLUCOSE 104* 102* 97 93 106*  --   BUN 21 26* 27* 31* 29*  --   CREATININE 2.04* 2.09* 2.00* 2.11* 1.75*  --   CALCIUM  9.0 8.5* 8.6* 8.4* 8.3*  --   MG 1.5* 2.3 2.3 2.2 2.0 1.9    Liver Function Tests: Recent Labs   Lab 07/18/24 1312  AST 26  ALT 18  ALKPHOS 141*  BILITOT 1.6*  PROT 5.7*  ALBUMIN  2.5*   No results for input(s): LIPASE, AMYLASE in the last 168 hours. No results for input(s): AMMONIA in the last 168 hours.  CBC: Recent Labs  Lab 07/19/24 0244 07/20/24 0351 07/21/24 0252 07/22/24 0231 07/23/24 0309  WBC 12.1* 12.2* 8.3 5.7 4.2  HGB 11.0* 10.4* 10.1* 10.2* 9.8*  HCT 35.0* 32.8* 31.7* 32.5* 31.7*  MCV 81.0 80.0 79.6* 80.4 80.1  PLT 237 211 208 227 230   INR: Recent Labs  Lab 07/20/24 0351 07/21/24 0252 07/22/24 0231 07/23/24 0309 07/24/24 0245  INR 2.1* 2.3* 2.5* 2.2* 2.1*   Other results: EKG:   Medications:    Scheduled Medications:  allopurinol   50 mg Oral Daily   famotidine   40 mg Oral Daily   feeding supplement (KATE FARMS STANDARD ENT 1.4)  325 mL Oral BID BM   fidaxomicin  200 mg Oral BID   furosemide   60 mg Oral Daily   Gerhardt's butt cream   Topical TID   potassium chloride   20 mEq Oral Daily   rosuvastatin   10 mg  Oral QHS   saccharomyces boulardii  250 mg Oral BID   sildenafil   20 mg Oral TID   Warfarin - Pharmacist Dosing Inpatient   Does not apply q1600    Infusions:   PRN Medications: acetaminophen , ondansetron  (ZOFRAN ) IV  Assessment/Plan:    1. C. Diff  - P/w fever/leukocytosis - Recently hospitalized for UTI. Completed antibiotics 07/14/24. Presenting with abd pain, N/V and fever. - + for C Diff. Treating w/ fidaxomicin - BM frequency decreasing.   2.  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 No evidence for overt bleeding. Hgb stable. - Continue Protonix  40 mg daily.  - Previously recommended that he start monthly octreotide  infusions to decrease risk for recurrent AVM bleeding.  He does not want to start this yet but is willing to start it if he has recurrent bleeding.   3. Chronic systolic CHF:  Long-standing cardiomyopathy. s/p HM III VAD on 09/17/22.   - Continue sildenafil  20 mg tid.  -  Continue warfarin, INR goal decreased to 2-2.5 with GI bleeding.  - INR 2.1. Warfarin dosing per PharmD  - continue lasix  60 mg daily and KCl 20 daily  3. Tricuspid regurgitation:  Tricuspid repair not done at time of VAD due to proximity of ICD wires and hypotension during surgery. He has severe TR.   4. Atrial fibrillation: Paroxysmal. He is off amiodarone  and his nausea improved. Will not restart it. He has been a-paced recently.   5. CKD stage IV: Suspect intra-op hypotension at the time of LVAD placement led to development of ATN => urine sediment looked like ATN per renal. Had initial CVVH and transitioned over to HD. He is now off HD.  - sCr at baseline  6. CAD: History of PCI to OM1 in 2007 and RCA in 2013.  No CP or ACs.  - Deferred cardiac cath in 12/22 due to AKI and plan for VAD - Continue Crestor .    7. Abnormal thyroid  function tests: Patient has had elevated TSH and free T4, mixed pattern. We have stopped amiodarone . 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  Most recent TSH was normal.  - He follows with endocrinology.    8. 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  INR goal increased to 2.5-3 but then decreased back to 2-2.5 with GI bleeding.   9. PVCs/NSVT: Improved  10. Delirium: Confusion in am, resolves later in day.  He is alert/oriented for me currently.   Aim for home tomorrow if diarrhea continues to improve.  Pt and wife not ready today.  I reviewed the LVAD parameters from today, and compared the results to the patient's prior recorded data.  No programming changes were made.  The LVAD is functioning within specified parameters.  The patient performs LVAD self-test daily.  LVAD interrogation was negative for any significant power changes, alarms or PI events/speed drops.  LVAD equipment check completed and is in good working order.  Back-up equipment present.   LVAD education  done on emergency procedures and precautions and reviewed exit site care.  Length of Stay: 6  [name], NP 07/24/2024, 3:10 PM  VAD Team --- VAD ISSUES ONLY--- Pager [phone] (7am - 7am)  Advanced Heart Failure Team  Pager [phone] (M-F; 7a - 5p) Please contact [facility] for night-coverage after hours (5p -7a ) and weekends on [email or web address]

## 2024-07-24 NOTE — Progress Notes (Signed)
 LVAD Coordinator Rounding Note:  Pt admitted 07/18/24 for diarrhea. Recently hospitalized for bacteremia and UTI. Completed antibiotics 07/14/24. + CDIFF this admission.   HM3 LVAD implanted on 09/17/21 by Dr Lucas under destination therapy criteria.  Pt laying in bed. States he feels like his diarrhea is improving. States he got lost overnight trying to get to the bathroom but reoriented quickly. Pt states he does not feel like he is ready for discharge.  Currently on Fidaxomicin 200 mg BID for treatment of C-Diff.   Vital signs: Temp: 98.2 HR: 69 Doppler Pressure: 84 Auto Cuff BP: 91/79 (85) O2 Sat: 94% on RA Wt: 178.7>179.9>179.2>168.4>168.8>178.8 lbs    LVAD interrogation reveals:  Speed: 5300 Flow: 4.5 Power: 3.8 w PI: 3.1 Hematocrit: 31  Alarms: none Events: none  Fixed speed: 5300 Low speed limit: 5000  Drive Line:  Dressing clean, dry, and intact. Anchor intact. Weekly dressing changes per VAD coordinator or pts wife. Next dressing change due: 07/26/24 by caregiver.   Labs:  LDH trend: 129>114>123>140  INR trend: 1.6>2.1>2.3>2.1  WBC: 12.1>12.2>8.3  Anticoagulation Plan: - INR Goal: 2-2.5  - Coumadin  dosing per pharmacy  Blood Products:  Device: - Cabin Crew - V paced 60 -Therapies: OFF  Infection:  07/18/24>> cdiff screen >> POSITIVE 07/18/24>> blood cultures >> no growth 3 days  Plan/Recommendations:  Page VAD coordinator for drive line or equipment concerns 2. Weekly dressing changes per VAD Coordinator or caregiver.  Schuyler Lunger RN, BSN VAD Coordinator 24/7 Pager [phone]

## 2024-07-24 NOTE — Plan of Care (Signed)
  Problem: Education: Goal: Patient will understand all VAD equipment and how it functions Outcome: Progressing Goal: Patient will be able to verbalize current INR target range and antiplatelet therapy for discharge home Outcome: Progressing   Problem: Cardiac: Goal: LVAD will function as expected and patient will experience no clinical alarms Outcome: Progressing   Problem: Education: Goal: Knowledge of General Education information will improve Description: Including pain rating scale, medication(s)/side effects and non-pharmacologic comfort measures Outcome: Progressing   Problem: Health Behavior/Discharge Planning: Goal: Ability to manage health-related needs will improve Outcome: Progressing   Problem: Clinical Measurements: Goal: Ability to maintain clinical measurements within normal limits will improve Outcome: Progressing Goal: Will remain free from infection Outcome: Progressing Goal: Diagnostic test results will improve Outcome: Progressing Goal: Respiratory complications will improve Outcome: Progressing Goal: Cardiovascular complication will be avoided Outcome: Progressing   Problem: Activity: Goal: Risk for activity intolerance will decrease Outcome: Progressing   Problem: Nutrition: Goal: Adequate nutrition will be maintained Outcome: Progressing   Problem: Coping: Goal: Level of anxiety will decrease Outcome: Progressing   Problem: Coping: Goal: Level of anxiety will decrease Outcome: Progressing   Problem: Elimination: Goal: Will not experience complications related to bowel motility Outcome: Progressing Goal: Will not experience complications related to urinary retention Outcome: Progressing   Problem: Elimination: Goal: Will not experience complications related to bowel motility Outcome: Progressing Goal: Will not experience complications related to urinary retention Outcome: Progressing   Problem: Pain Managment: Goal: General experience  of comfort will improve and/or be controlled Outcome: Progressing   Problem: Safety: Goal: Ability to remain free from injury will improve Outcome: Progressing   Problem: Skin Integrity: Goal: Risk for impaired skin integrity will decrease Outcome: Progressing

## 2024-07-24 NOTE — Plan of Care (Signed)
  Problem: Education: Goal: Patient will understand all VAD equipment and how it functions Outcome: Progressing Goal: Patient will be able to verbalize current INR target range and antiplatelet therapy for discharge home Outcome: Progressing   Problem: Cardiac: Goal: LVAD will function as expected and patient will experience no clinical alarms Outcome: Progressing   Problem: Education: Goal: Knowledge of General Education information will improve Description: Including pain rating scale, medication(s)/side effects and non-pharmacologic comfort measures Outcome: Progressing   Problem: Health Behavior/Discharge Planning: Goal: Ability to manage health-related needs will improve Outcome: Progressing   Problem: Clinical Measurements: Goal: Diagnostic test results will improve Outcome: Progressing Goal: Respiratory complications will improve Outcome: Progressing Goal: Cardiovascular complication will be avoided Outcome: Progressing   Problem: Elimination: Goal: Will not experience complications related to urinary retention Outcome: Progressing   Problem: Pain Managment: Goal: General experience of comfort will improve and/or be controlled Outcome: Progressing

## 2024-07-25 ENCOUNTER — Encounter (HOSPITAL_COMMUNITY): Payer: Self-pay | Admitting: Cardiology

## 2024-07-25 ENCOUNTER — Other Ambulatory Visit (HOSPITAL_COMMUNITY): Payer: Self-pay

## 2024-07-25 ENCOUNTER — Encounter: Payer: Self-pay | Admitting: Urology

## 2024-07-25 ENCOUNTER — Encounter: Admitting: Speech Pathology

## 2024-07-25 ENCOUNTER — Encounter

## 2024-07-25 ENCOUNTER — Ambulatory Visit: Admitting: Physical Therapy

## 2024-07-25 DIAGNOSIS — I5022 Chronic systolic (congestive) heart failure: Secondary | ICD-10-CM | POA: Diagnosis not present

## 2024-07-25 DIAGNOSIS — Z95811 Presence of heart assist device: Secondary | ICD-10-CM | POA: Diagnosis not present

## 2024-07-25 DIAGNOSIS — A0472 Enterocolitis due to Clostridium difficile, not specified as recurrent: Secondary | ICD-10-CM | POA: Diagnosis not present

## 2024-07-25 LAB — BASIC METABOLIC PANEL WITH GFR
Anion gap: 7 (ref 5–15)
BUN: 24 mg/dL — ABNORMAL HIGH (ref 8–23)
CO2: 23 mmol/L (ref 22–32)
Calcium: 8.4 mg/dL — ABNORMAL LOW (ref 8.9–10.3)
Chloride: 107 mmol/L (ref 98–111)
Creatinine, Ser: 1.75 mg/dL — ABNORMAL HIGH (ref 0.61–1.24)
GFR, Estimated: 41 mL/min — ABNORMAL LOW (ref >60)
Glucose, Bld: 89 mg/dL (ref 70–99)
Potassium: 4.2 mmol/L (ref 3.5–5.1)
Sodium: 137 mmol/L (ref 135–145)

## 2024-07-25 LAB — CBC
HCT: 33 % — ABNORMAL LOW (ref 39.0–52.0)
Hemoglobin: 10.5 g/dL — ABNORMAL LOW (ref 13.0–17.0)
MCH: 25.1 pg — ABNORMAL LOW (ref 26.0–34.0)
MCHC: 31.8 g/dL (ref 30.0–36.0)
MCV: 78.9 fL — ABNORMAL LOW (ref 80.0–100.0)
Platelets: 269 K/uL (ref 150–400)
RBC: 4.18 MIL/uL — ABNORMAL LOW (ref 4.22–5.81)
RDW: 20.1 % — ABNORMAL HIGH (ref 11.5–15.5)
WBC: 4.2 K/uL (ref 4.0–10.5)
nRBC: 0 % (ref 0.0–0.2)

## 2024-07-25 LAB — PROTIME-INR
INR: 2.1 — ABNORMAL HIGH (ref 0.8–1.2)
Prothrombin Time: 24.9 s — ABNORMAL HIGH (ref 11.4–15.2)

## 2024-07-25 LAB — LACTATE DEHYDROGENASE: LDH: 130 U/L (ref 98–192)

## 2024-07-25 LAB — MAGNESIUM: Magnesium: 1.6 mg/dL — ABNORMAL LOW (ref 1.7–2.4)

## 2024-07-25 MED ORDER — MAGNESIUM SULFATE 4 GM/100ML IV SOLN
4.0000 g | Freq: Once | INTRAVENOUS | 0 refills | Status: DC
  Filled 2024-07-25: qty 100, 1d supply, fill #0

## 2024-07-25 MED ORDER — FIDAXOMICIN 200 MG PO TABS
200.0000 mg | ORAL_TABLET | Freq: Two times a day (BID) | ORAL | 0 refills | Status: DC
  Filled 2024-07-25: qty 7, 4d supply, fill #0

## 2024-07-25 MED ORDER — FIDAXOMICIN 200 MG PO TABS
200.0000 mg | ORAL_TABLET | Freq: Two times a day (BID) | ORAL | 0 refills | Status: DC
  Filled 2024-07-25: qty 6, 3d supply, fill #0

## 2024-07-25 MED ORDER — SACCHAROMYCES BOULARDII 250 MG PO CAPS
250.0000 mg | ORAL_CAPSULE | Freq: Two times a day (BID) | ORAL | 6 refills | Status: AC
  Filled 2024-07-25: qty 60, 30d supply, fill #0

## 2024-07-25 MED ORDER — MAGNESIUM SULFATE 4 GM/100ML IV SOLN
4.0000 g | Freq: Once | INTRAVENOUS | Status: AC
  Administered 2024-07-25: 4 g via INTRAVENOUS
  Filled 2024-07-25: qty 100

## 2024-07-25 NOTE — TOC Transition Note (Signed)
 Transition of Care [facility]) - Discharge Note   Patient Details  Name: Isaac [name] MRN: [id number] Date of Birth: Oct 11, 1950  Transition of [facility]) CM/SW Contact:  Justina Delcia Czar, RN Phone Number: [phone] 07/25/2024, 12:29 PM   Clinical Narrative:    Spoke to pt and states his wife will provide transportation home. Pt states he will continue with cardiac rehabilitation. Referral in for cardiac rehab or outpatient PT. Pt has Rollator and scale at home.   Contacted PCP office and they will contact to arrange appointment.    Final next level of care: Home/Self Care Barriers to Discharge: No Barriers Identified   Patient Goals and CMS Choice Patient states their goals for this hospitalization and ongoing recovery are:: wants to get better     Discharge Placement     Discharge Plan and Services Additional resources added to the After Visit Summary for     Discharge Planning Services: CM Consult               Social Drivers of Health (SDOH) Interventions SDOH Screenings   Food Insecurity: No Food Insecurity (07/18/2024)  Housing: Low Risk  (07/18/2024)  Transportation Needs: No Transportation Needs (07/18/2024)  Utilities: Not At Risk (07/18/2024)  Depression (PHQ2-9): Low Risk  (04/14/2022)  Social Connections: Unknown (07/18/2024)  Tobacco Use: Medium Risk (07/19/2024)     Readmission Risk Interventions     No data to display

## 2024-07-25 NOTE — Progress Notes (Signed)
 Occupational Therapy Treatment Patient Details Name: Isaac [name] MRN: [id number] DOB: Mar 16, 1951 Today's Date: 07/25/2024   History of present illness 73 y.o. male admitted 07/18/24 for diarrhea. Recently hospitalized for bacteremia and UTI. Completed antibiotics 07/14/24. + C-DIFF this admission.  PMH: CAD, HTN, atrial fibrillation, and chronic systolic CHF s/p LVAD HM3.   OT comments  Pt progressing toward goals, needs up to min A for ADLs, CGA for bed mobility and CGA for transfers without AD. MD present during session with plan for d/c today, pt on LVAD batteries upon arrival and departure, RN aware. Pt able to complete hallway distance ambulation with decr RLE pain and UB/LB ADL with minA. Pt presenting with impairments listed below, will follow acutely. Continue to anticipate no OT follow up needs at d/c.       If plan is discharge home, recommend the following:  A little help with walking and/or transfers;A little help with bathing/dressing/bathroom;Assistance with cooking/housework;Direct supervision/assist for financial management;Direct supervision/assist for medications management;Assist for transportation;Help with stairs or ramp for entrance   Equipment Recommendations  None recommended by OT    Recommendations for Other Services PT consult    Precautions / Restrictions Precautions Precautions: Fall Recall of Precautions/Restrictions: Intact Precaution/Restrictions Comments: LVAD; enteric Restrictions Weight Bearing Restrictions Per Provider Order: No       Mobility Bed Mobility Overal bed mobility: Needs Assistance Bed Mobility: Sit to Supine, Supine to Sit     Supine to sit: Contact guard Sit to supine: Contact guard assist        Transfers Overall transfer level: Needs assistance Equipment used: None Transfers: Sit to/from Stand Sit to Stand: Contact guard assist                 Balance Overall balance assessment: Mild deficits observed,  not formally tested                                         ADL either performed or assessed with clinical judgement   ADL Overall ADL's : Needs assistance/impaired                 Upper Body Dressing : Minimal assistance;Sitting   Lower Body Dressing: Minimal assistance;Sitting/lateral leans   Toilet Transfer: Contact guard assist           Functional mobility during ADLs: Contact guard assist      Extremity/Trunk Assessment Upper Extremity Assessment Upper Extremity Assessment: Generalized weakness   Lower Extremity Assessment Lower Extremity Assessment: Defer to PT evaluation        Vision   Vision Assessment?: No apparent visual deficits   Perception Perception Perception: Not tested   Praxis Praxis Praxis: Not tested   Communication Communication Communication: No apparent difficulties   Cognition Arousal: Alert Behavior During Therapy: WFL for tasks assessed/performed Cognition: Cognition impaired   Orientation impairments: Situation Awareness: Online awareness impaired                         Following commands: Intact        Cueing   Cueing Techniques: Verbal cues, Gestural cues  Exercises      Shoulder Instructions       General Comments VSS    Pertinent Vitals/ Pain       Pain Assessment Pain Assessment: No/denies pain Faces Pain Scale: Hurts a little bit Pain Location: R  ankle Pain Descriptors / Indicators: Aching Pain Intervention(s): Limited activity within patient's tolerance, Monitored during session, Repositioned  Home Living                                          Prior Functioning/Environment              Frequency  Min 2X/week        Progress Toward Goals  OT Goals(current goals can now be found in the care plan section)  Progress towards OT goals: Progressing toward goals  Acute Rehab OT Goals Patient Stated Goal: did not state OT Goal Formulation:  With patient Time For Goal Achievement: 08/06/24 Potential to Achieve Goals: Good ADL Goals Pt Will Perform Upper Body Dressing: with modified independence;sitting Pt Will Perform Lower Body Dressing: with modified independence;sitting/lateral leans;sit to/from stand Pt Will Transfer to Toilet: with modified independence;ambulating;regular height toilet  Plan      Co-evaluation                 AM-PAC OT 6 Clicks Daily Activity     Outcome Measure   Help from another person eating meals?: None Help from another person taking care of personal grooming?: A Little Help from another person toileting, which includes using toliet, bedpan, or urinal?: A Little Help from another person bathing (including washing, rinsing, drying)?: A Little Help from another person to put on and taking off regular upper body clothing?: A Little Help from another person to put on and taking off regular lower body clothing?: A Little 6 Click Score: 19    End of Session    OT Visit Diagnosis: Unsteadiness on feet (R26.81);Other abnormalities of gait and mobility (R26.89);Muscle weakness (generalized) (M62.81)   Activity Tolerance Patient tolerated treatment well   Patient Left in bed;with call bell/phone within reach;with bed alarm set;with family/visitor present   Nurse Communication Mobility status;Other (comment) (Pt on LVAD batteries upon arrival and departure -- MD came in during session with plan for d/c today)        Time: 1012-1032 OT Time Calculation (min): 20 min  Charges: OT General Charges $OT Visit: 1 Visit OT Treatments $Self Care/Home Management : 8-22 mins  Graycen Sadlon K, OTD, OTR/L SecureChat Preferred Acute Rehab [phone]   Laneta POUR Koonce 07/25/2024, 10:38 AM

## 2024-07-25 NOTE — Plan of Care (Signed)
  Problem: Education: Goal: Patient will understand all VAD equipment and how it functions Outcome: Progressing Goal: Patient will be able to verbalize current INR target range and antiplatelet therapy for discharge home Outcome: Progressing   Problem: Cardiac: Goal: LVAD will function as expected and patient will experience no clinical alarms Outcome: Progressing   Problem: Education: Goal: Knowledge of General Education information will improve Description: Including pain rating scale, medication(s)/side effects and non-pharmacologic comfort measures Outcome: Progressing   Problem: Health Behavior/Discharge Planning: Goal: Ability to manage health-related needs will improve Outcome: Progressing   Problem: Clinical Measurements: Goal: Ability to maintain clinical measurements within normal limits will improve Outcome: Progressing Goal: Will remain free from infection Outcome: Progressing Goal: Diagnostic test results will improve Outcome: Progressing Goal: Respiratory complications will improve Outcome: Progressing Goal: Cardiovascular complication will be avoided Outcome: Progressing   Problem: Activity: Goal: Risk for activity intolerance will decrease Outcome: Progressing   Problem: Nutrition: Goal: Adequate nutrition will be maintained Outcome: Progressing   Problem: Coping: Goal: Level of anxiety will decrease Outcome: Progressing   Problem: Elimination: Goal: Will not experience complications related to bowel motility Outcome: Progressing Goal: Will not experience complications related to urinary retention Outcome: Progressing   Problem: Pain Managment: Goal: General experience of comfort will improve and/or be controlled Outcome: Progressing   Problem: Safety: Goal: Ability to remain free from injury will improve Outcome: Progressing   Problem: Skin Integrity: Goal: Risk for impaired skin integrity will decrease Outcome: Progressing

## 2024-07-25 NOTE — Progress Notes (Signed)
 Physical Therapy Treatment Patient Details Name: Isaac [name] MRN: [id number] DOB: 11-05-1950 Today's Date: 07/25/2024   History of Present Illness 73 y.o. male admitted 07/18/24 for diarrhea. Recently hospitalized for bacteremia and UTI. Completed antibiotics 07/14/24. + C-DIFF this admission.  PMH: CAD, HTN, atrial fibrillation, and chronic systolic CHF s/p LVAD HM3.    PT Comments  Mod I with bed mobility, supervision for transfer and gait without overt LOB today. Appears more steady but with mild sway. Uses IV pole for additional support and is agreeable to use his SPC at d/c. VSS, already on LVAD batteries when PT entered room (Both fully charged.) Patient will continue to benefit from skilled physical therapy services to further improve independence with functional mobility.     If plan is discharge home, recommend the following: A little help with walking and/or transfers;A little help with bathing/dressing/bathroom;Assistance with cooking/housework;Assist for transportation;Help with stairs or ramp for entrance;Supervision due to cognitive status   Can travel by private vehicle        Equipment Recommendations  None recommended by PT    Recommendations for Other Services       Precautions / Restrictions Precautions Precautions: Fall Recall of Precautions/Restrictions: Intact Precaution/Restrictions Comments: LVAD; enteric Required Braces or Orthoses: Other Brace (LVAD KIT) Restrictions Weight Bearing Restrictions Per Provider Order: No     Mobility  Bed Mobility Overal bed mobility: Modified Independent             General bed mobility comments: Mod I without assist required to rise.    Transfers Overall transfer level: Needs assistance Equipment used: None Transfers: Sit to/from Stand Sit to Stand: Supervision           General transfer comment: Supervision for safety to stand from bed with mild sway but self corrects. denies dizziness.     Ambulation/Gait Ambulation/Gait assistance: Supervision Gait Distance (Feet): 225 Feet Assistive device: None, IV Pole Gait Pattern/deviations: Step-through pattern, Decreased stride length, Drifts right/left Gait velocity: dec Gait velocity interpretation: <1.31 ft/sec, indicative of household ambulator   General Gait Details: No overt LOB noted today, still a bit guarded and slower pace but adequate for control and safety. Educated on awareness. Pt using IV pole as well, agreeable to [facility] after education.   Stairs             Wheelchair Mobility     Tilt Bed    Modified Rankin (Stroke Patients Only)       Balance Overall balance assessment: Mild deficits observed, not formally tested Sitting-balance support: No upper extremity supported, Feet supported Sitting balance-Leahy Scale: Good     Standing balance support: No upper extremity supported, During functional activity Standing balance-Leahy Scale: Fair                              Hotel Manager: No apparent difficulties  Cognition Arousal: Alert Behavior During Therapy: WFL for tasks assessed/performed   PT - Cognitive impairments: Awareness, Problem solving                         Following commands: Intact      Cueing Cueing Techniques: Verbal cues, Gestural cues  Exercises      General Comments General comments (skin integrity, edema, etc.): VSS. Already on LVAD batteries when PT entered room.      Pertinent Vitals/Pain Pain Assessment Pain Assessment: No/denies pain    Home Living  Prior Function            PT Goals (current goals can now be found in the care plan section) Acute Rehab PT Goals Patient Stated Goal: Go home, no more diarrhea PT Goal Formulation: With patient/family Time For Goal Achievement: 08/05/24 Potential to Achieve Goals: Good Progress towards PT goals: Progressing toward  goals    Frequency    Min 2X/week      PT Plan      Co-evaluation              AM-PAC PT 6 Clicks Mobility   Outcome Measure  Help needed turning from your back to your side while in a flat bed without using bedrails?: None Help needed moving from lying on your back to sitting on the side of a flat bed without using bedrails?: None Help needed moving to and from a bed to a chair (including a wheelchair)?: A Little Help needed standing up from a chair using your arms (e.g., wheelchair or bedside chair)?: A Little Help needed to walk in hospital room?: A Little Help needed climbing 3-5 steps with a railing? : A Little 6 Click Score: 20    End of Session Equipment Utilized During Treatment: Gait belt Activity Tolerance: Patient tolerated treatment well Patient left: with call bell/phone within reach;with bed alarm set;in bed   PT Visit Diagnosis: Unsteadiness on feet (R26.81);Other abnormalities of gait and mobility (R26.89);Muscle weakness (generalized) (M62.81);Other symptoms and signs involving the nervous system (R29.898);Difficulty in walking, not elsewhere classified (R26.2);Pain     Time: 8854-8841 PT Time Calculation (min) (ACUTE ONLY): 13 min  Charges:    $Gait Training: 8-22 mins PT General Charges $$ ACUTE PT VISIT: 1 Visit                     Leontine Roads, PT, DPT [facility]  Rehabilitation Services Physical Therapist Office: [phone] Website: [email or web address]    Leontine GORMAN Roads 07/25/2024, 12:44 PM

## 2024-07-25 NOTE — Progress Notes (Signed)
 LVAD Coordinator Rounding Note:  Pt admitted 07/18/24 for diarrhea. Recently hospitalized for bacteremia and UTI. Completed antibiotics 07/14/24. + CDIFF this admission.   HM3 LVAD implanted on 09/17/21 by Dr Lucas under destination therapy criteria.  Pt laying in bed. Bedside RN states diarrhea has resolved. Pt states he feels ready for discharge. AHF team aware. Outpatient hospital f/u made for 11/11 at 1000.  Currently on Fidaxomicin 200 mg BID for treatment of C-Diff.   Vital signs: Temp: 98 HR: 79 Doppler Pressure: none documented Auto Cuff BP: 81/60 (67) O2 Sat: 98% on RA Wt: 178.7>179.9>179.2>168.4>168.8>178.8>166.2 lbs    LVAD interrogation reveals:  Speed: 5300 Flow: 4.8 Power: 3.9 w PI: 2.5 Hematocrit: 31  Alarms: none Events: none  Fixed speed: 5300 Low speed limit: 5000  Drive Line:  Dressing clean, dry, and intact. Anchor intact. Weekly dressing changes per VAD coordinator or pts wife. Next dressing change due: 07/26/24 by caregiver.   Labs:  LDH trend: 129>114>123>140>130  INR trend: 1.6>2.1>2.3>2.1>2.1  WBC: 12.1>12.2>8.3>4.2  Anticoagulation Plan: - INR Goal: 2-2.5  - Coumadin  dosing per pharmacy  Blood Products:  Device: - Cabin Crew - V paced 60 -Therapies: OFF  Infection:  07/18/24>> cdiff screen >> POSITIVE 07/18/24>> blood cultures >> no growth 3 days  Plan/Recommendations:  Page VAD coordinator for drive line or equipment concerns 2. Weekly dressing changes per VAD Coordinator or caregiver.  Schuyler Lunger RN, BSN VAD Coordinator 24/7 Pager [phone]

## 2024-07-25 NOTE — Plan of Care (Signed)
  Problem: Education: Goal: Patient will understand all VAD equipment and how it functions Outcome: Progressing   Problem: Cardiac: Goal: LVAD will function as expected and patient will experience no clinical alarms Outcome: Progressing   Problem: Education: Goal: Knowledge of General Education information will improve Description: Including pain rating scale, medication(s)/side effects and non-pharmacologic comfort measures Outcome: Progressing   Problem: Health Behavior/Discharge Planning: Goal: Ability to manage health-related needs will improve Outcome: Progressing   Problem: Clinical Measurements: Goal: Cardiovascular complication will be avoided Outcome: Progressing   Problem: Elimination: Goal: Will not experience complications related to urinary retention Outcome: Progressing   Problem: Pain Managment: Goal: General experience of comfort will improve and/or be controlled Outcome: Progressing

## 2024-07-27 ENCOUNTER — Encounter: Admitting: Speech Pathology

## 2024-07-27 ENCOUNTER — Ambulatory Visit: Admitting: Physical Therapy

## 2024-07-27 ENCOUNTER — Encounter (HOSPITAL_COMMUNITY): Admitting: Cardiology

## 2024-07-28 ENCOUNTER — Ambulatory Visit (HOSPITAL_COMMUNITY): Payer: Self-pay | Admitting: Pharmacist

## 2024-07-28 LAB — POCT INR: INR: 2.6 (ref 2.0–3.0)

## 2024-07-31 ENCOUNTER — Ambulatory Visit: Payer: Self-pay | Admitting: Internal Medicine

## 2024-07-31 ENCOUNTER — Other Ambulatory Visit (HOSPITAL_COMMUNITY): Payer: Self-pay | Admitting: *Deleted

## 2024-07-31 DIAGNOSIS — Z7901 Long term (current) use of anticoagulants: Secondary | ICD-10-CM

## 2024-07-31 DIAGNOSIS — Z95811 Presence of heart assist device: Secondary | ICD-10-CM

## 2024-07-31 DIAGNOSIS — I5022 Chronic systolic (congestive) heart failure: Secondary | ICD-10-CM

## 2024-08-01 ENCOUNTER — Ambulatory Visit: Admitting: Physical Therapy

## 2024-08-01 ENCOUNTER — Encounter: Admitting: Speech Pathology

## 2024-08-01 ENCOUNTER — Inpatient Hospital Stay (HOSPITAL_COMMUNITY): Admitting: Cardiology

## 2024-08-03 ENCOUNTER — Encounter: Admitting: Speech Pathology

## 2024-08-03 ENCOUNTER — Ambulatory Visit: Admitting: Physical Therapy

## 2024-08-04 ENCOUNTER — Ambulatory Visit (HOSPITAL_COMMUNITY): Payer: Self-pay | Admitting: Pharmacist

## 2024-08-04 LAB — POCT INR: INR: 1.6 — AB (ref 2.0–3.0)

## 2024-08-08 ENCOUNTER — Ambulatory Visit: Admitting: Physical Therapy

## 2024-08-08 ENCOUNTER — Encounter: Admitting: Speech Pathology

## 2024-08-09 ENCOUNTER — Ambulatory Visit (HOSPITAL_COMMUNITY)
Admission: RE | Admit: 2024-08-09 | Discharge: 2024-08-09 | Disposition: A | Discharge status: Home / Self Care | Source: Ambulatory Visit | Attending: Cardiology | Admitting: Cardiology

## 2024-08-09 DIAGNOSIS — I081 Rheumatic disorders of both mitral and tricuspid valves: Secondary | ICD-10-CM | POA: Insufficient documentation

## 2024-08-09 DIAGNOSIS — N329 Bladder disorder, unspecified: Secondary | ICD-10-CM | POA: Insufficient documentation

## 2024-08-09 DIAGNOSIS — I480 Paroxysmal atrial fibrillation: Secondary | ICD-10-CM | POA: Diagnosis not present

## 2024-08-09 DIAGNOSIS — M109 Gout, unspecified: Secondary | ICD-10-CM | POA: Diagnosis not present

## 2024-08-09 DIAGNOSIS — Z95811 Presence of heart assist device: Secondary | ICD-10-CM | POA: Diagnosis not present

## 2024-08-09 DIAGNOSIS — R935 Abnormal findings on diagnostic imaging of other abdominal regions, including retroperitoneum: Secondary | ICD-10-CM | POA: Insufficient documentation

## 2024-08-09 DIAGNOSIS — Z79899 Other long term (current) drug therapy: Secondary | ICD-10-CM | POA: Insufficient documentation

## 2024-08-09 DIAGNOSIS — Z7901 Long term (current) use of anticoagulants: Secondary | ICD-10-CM

## 2024-08-09 DIAGNOSIS — N184 Chronic kidney disease, stage 4 (severe): Secondary | ICD-10-CM | POA: Insufficient documentation

## 2024-08-09 DIAGNOSIS — I130 Hypertensive heart and chronic kidney disease with heart failure and stage 1 through stage 4 chronic kidney disease, or unspecified chronic kidney disease: Secondary | ICD-10-CM | POA: Insufficient documentation

## 2024-08-09 DIAGNOSIS — I429 Cardiomyopathy, unspecified: Secondary | ICD-10-CM | POA: Diagnosis not present

## 2024-08-09 DIAGNOSIS — Z955 Presence of coronary angioplasty implant and graft: Secondary | ICD-10-CM | POA: Insufficient documentation

## 2024-08-09 DIAGNOSIS — M25562 Pain in left knee: Secondary | ICD-10-CM | POA: Diagnosis not present

## 2024-08-09 DIAGNOSIS — I5022 Chronic systolic (congestive) heart failure: Secondary | ICD-10-CM | POA: Diagnosis not present

## 2024-08-09 DIAGNOSIS — Z8673 Personal history of transient ischemic attack (TIA), and cerebral infarction without residual deficits: Secondary | ICD-10-CM | POA: Diagnosis not present

## 2024-08-09 DIAGNOSIS — R946 Abnormal results of thyroid function studies: Secondary | ICD-10-CM | POA: Diagnosis not present

## 2024-08-09 LAB — MAGNESIUM: Magnesium: 1.7 mg/dL (ref 1.7–2.4)

## 2024-08-09 LAB — PROTIME-INR
INR: 1.6 — ABNORMAL HIGH (ref 0.8–1.2)
Prothrombin Time: 20.3 s — ABNORMAL HIGH (ref 11.4–15.2)

## 2024-08-09 LAB — CBC
HCT: 35.7 % — ABNORMAL LOW (ref 39.0–52.0)
Hemoglobin: 11.3 g/dL — ABNORMAL LOW (ref 13.0–17.0)
MCH: 25.6 pg — ABNORMAL LOW (ref 26.0–34.0)
MCHC: 31.7 g/dL (ref 30.0–36.0)
MCV: 81 fL (ref 80.0–100.0)
Platelets: 330 K/uL (ref 150–400)
RBC: 4.41 MIL/uL (ref 4.22–5.81)
RDW: 20.8 % — ABNORMAL HIGH (ref 11.5–15.5)
WBC: 5.2 K/uL (ref 4.0–10.5)
nRBC: 0 % (ref 0.0–0.2)

## 2024-08-09 LAB — BASIC METABOLIC PANEL WITH GFR
Anion gap: 11 (ref 5–15)
BUN: 16 mg/dL (ref 8–23)
CO2: 24 mmol/L (ref 22–32)
Calcium: 9.4 mg/dL (ref 8.9–10.3)
Chloride: 103 mmol/L (ref 98–111)
Creatinine, Ser: 1.89 mg/dL — ABNORMAL HIGH (ref 0.61–1.24)
GFR, Estimated: 37 mL/min — ABNORMAL LOW (ref >60)
Glucose, Bld: 94 mg/dL (ref 70–99)
Potassium: 3.7 mmol/L (ref 3.5–5.1)
Sodium: 138 mmol/L (ref 135–145)

## 2024-08-09 LAB — LACTATE DEHYDROGENASE: LDH: 140 U/L (ref 105–235)

## 2024-08-09 MED ORDER — ALLOPURINOL 100 MG PO TABS: 50.0000 mg | ORAL_TABLET | Freq: Every day | ORAL | 3 refills | Status: AC

## 2024-08-09 MED ORDER — COLCHICINE 0.6 MG PO TABS
0.6000 mg | ORAL_TABLET | Freq: Every day | ORAL | 3 refills | Status: AC
Start: 2024-08-09 — End: ?

## 2024-08-09 MED ORDER — FUROSEMIDE 40 MG PO TABS: 60.0000 mg | ORAL_TABLET | Freq: Every day | ORAL | 3 refills | Status: AC

## 2024-08-09 NOTE — Progress Notes (Signed)
 Patient presents for hospital follow up with his wife [name]. Denies issues/concerns with VAD equipment or drive line.    Pt ambulated into clinic independently. Pt admitted twice in October due to UTI with positive blood cultures for E Coli. Discharged on Cefadroxil . Then again 2 weeks later due to fever, nausea, vomiting, diarrhea and body aches. Pt C.Diff + at time of admisson. Pt completed treated for C.Diff 11/7. States he is feeling better since discharge but continues to have diarrhea intermittent. Pt reports increases appetite but only eating about 2 meals a day. Pt's wife trying to supplement protein shakes in between meals but feels like this may have contributed to a gout flare. Pt's wife also reports that he is not drinking much so she has decreased his Lasix  to 40mg  daily. Dr. Rolan advises for pt to continue prescribed dose of 60mg  daily.  Pt complaining of gout pain in his left knee. He saw his PCP for this yesterday who prescribed Prednisone  and Allopurinol . However, pt was already taking Allopurinol  daily. Pt did not start prednisone  due to wanting to discuss further with Dr. Rolan first. MD advised to use Colchicine  0.6mg  (renal dosed) daily until pain resolves and then use as needed for acute gout flares.   Vital Signs:  Automatic BP: 104/69 (80) Doppler: 82 HR: 91 SPO2: 100%   Weight: 171 lb w/ eqt Discharge weight: 166.2 w/eqt Last Clinic weight: 178.7 lb w/eqt   VAD Indication: Destination Therapy due to age and kidney disease   LVAD assessment:  HM III: Speed: 5300 rpms Flow: 4.8 Power: 4.0 w    PI: 2.4 Alarms: none Events:  rare  Fixed speed: 5300 Low speed limit: 5000  Primary controller:  Back up battery due for replacement in 32 months Secondary controller: Back up battery due for replacement in 33 months - did not bring   I reviewed the LVAD parameters from today and compared the results to the patient's prior recorded data. LVAD interrogation was  NEGATIVE for significant power changes, NEGATIVE for clinical alarms and STABLE for PI events/speed drops. No programming changes were made and pump is functioning within specified parameters. Pt is performing daily controller and system monitor self tests along with completing weekly and monthly maintenance for LVAD equipment.   LVAD equipment check completed and is in good working order. Back-up equipment present.   Annual Maintenance completed: 08/30/2023  Exit Site Care: Wife has been performing weekly dressing changes using weekly dressings. Drive line anchor correctly applied.  Next dressing due tomorrow.  Device:  [employer] dual  Therapies: on Pacing: VVI 50 Last check: 05/12/24   BP & Labs:  Doppler 82 - Doppler is reflecting modified systolic   Hgb 11.3 - No S/S of bleeding. Specifically denies melena/BRBPR or nosebleeds.   LDH  140 with established baseline of 200 - 300. Denies tea-colored urine. No power elevations noted on interrogation.   Patient Instructions:  1.Continue Lasix  60mg  daily 2. Start Colchicine  until gout pain resolves and then use as needed 3. Coumadin  dosing per [facility] 3. Follow up in 2 months with 4 yr Intermacs and Annual Maintenance. Please bring equipment and wear tennis shoes for 6 minute walk.  Schuyler Lunger RN, BSN VAD Coordinator 24/7 Pager [phone]

## 2024-08-09 NOTE — Patient Instructions (Addendum)
 1.Continue Lasix  60mg  daily 2. Start Colchicine  until gout pain resolves and then use as needed 3. Coumadin  dosing per [facility] 3. Follow up in 2 months with 4 yr Intermacs and Annual Maintenance. Please bring equipment and wear tennis shoes for 6 minute walk.

## 2024-08-09 NOTE — Progress Notes (Signed)
 Cardiology: Dr. Rolan PCP: Valora Lynwood FALCON, MD  Chief complaint: CHF  Follow up for Heart Failure/LVAD: 73 y.o. with history of CAD, HTN, atrial fibrillation, and chronic systolic CHF returns for followup of CHF/LVAD.   Patient has had a long-standing cardiomyopathy, EF had been in the 25% range for years.  He had PCI to OM1 in 2007 and RCA in 2013.  From his report, these episodes do not sound like ACS events.  Echo in 12/22 showed EF < 20%, severe LV dilation, restrictive diastolic function, moderate RV dysfunction, moderate MR, mod-severe TR. HF was been complicated by CKD stage 3. He was also noted on device interrogation to have been in atrial fibrillation persistently since 10/22.  He was admitted at [facility] in 12/22.  BP was low, his BP-active meds were stopped and midodrine  was begun.  He was diuresed and discharged home.    He reports ongoing severe dyspnea and was admitted again in 12/22 after he stood up then passed out at home.  Device interrogation showed run of VT treated x 2 with ATP possibly around the time of syncope.  RHC in 12/22 showed low output HF and he developed cardiogenic shock. Impella 5.5 was placed.  On 09/17/21, HM3 LVAD was placed, ICD leads were plastered to the tricuspid valve with severe TR, the valve was not replaced.  The patient had significant interoperative hypotension.  Post-operatively, he developed progressive renal failure and was started on CVVH then iHD.  He had a bumpy post-op course with renal failure and RV failure, but was ultimately able to discharge home on 10/24/21.   Ramp echo in 3/23, RV mildly dilated with normal systolic function, severe TR, LV EF 20-25%, aortic valve opens every beat.  Speed increased to 5300 rpm. The IV septum was mildly leftwards at 5300 rpm but flow increased and AoV still opened every beat.   Patient was admitted in 3/24 with left groin abscess.  This spontaneously drained and was treated with IV antibiotics.  He was sent  home on linezolid  and Augmentin .  He developed profuse diarrhea that he attributed to linezolid .  Linezolid  was stopped and he was continued on Augmentin .   Echo in 7/24 showed EF < 20%, IV septum midline to slightly leftward, RV moderately dilated with moderately decreased systolic function, IVC dilated with severe TR.  Speed not changed.   He was admitted with dysarthria and right-sided weakness in 5/25.  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 Echo this admission showed EF 20-25%, mild LVH, LVAD present, mild RV dysfunction, moderate AI, mild RV dysfunction with normal RV size, severe TR.  INR goal increased to 2.5-3. Dual chamber ICD generator was also changed this admission, needs pacing at times with bradycardia.   Patient was admitted 03/21/24 with GI bleeding/melena, he received a total of 7 units PRBCs.  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  INR goal was decreased to 2-2.5.   Patient was admitted in 10/25 with acute cystitis.  Renal ultrasound showed a polypoid lesion along the mucosal surface of the posterior bladder wall.   Patient was then admitted again in 11/25 with severe GI symptoms and found to have C difficile colitis.  He was treated with fidaxomicin  and has finished the course.   Patient returns for LVAD followup with his wife.  He is eating better.  Still with loose stools but frequency has  decreased.  Having gout pain in left knee.  Weight is up but as noted is eating more.  No dyspnea with usual activities.  Has felt weak after recent hospitalizations but improving.   LVAD interrogation (personally reviewed): LVAD Documentation    08/09/2024  Device Info  LVAD Type: Heartmate III  Date of Implant: 09/17/2021  Therapy Type: Destination Therapy      08/09/2024  Vitals  Heart Rate: 91 BPM  Automatic BP: 104/69  Doppler MAP: 82 mmHg  SpO2: 100 %    Last 3 Weights  Weight Weight  08/09/2024 77.565 kg 171 lb  07/25/2024 75.4 kg 166 lb 3.6 oz  07/24/2024 81 kg 178 lb 9.2 oz       08/09/2024  LVAD Paramaters  Speed: 5300 RPM  Flow: 5 LPM  PI: 2  Power: 4 Watts  Hematocrit: 32 %  Alarms: none  Events: rare  Last Speed Change Date: 12/09/2021  Last Ramp Echo Date: 12/09/2021  Last Right Heart Cath Date: 09/08/2021  Bleeding History: Yes  Type of Bleeding Hx: GI Bleed  Type of Dressing: Weekly  Annual Maintenance Date: 10/07/2022    Labs    Units 08/09/24 1010 08/04/24 0000 07/28/24 0000 07/25/24 0253 07/24/24 0245 07/23/24 0309  INR  1.6* 1.6* 2.6 2.1* 2.1* 2.2*  LDH U/L 140  --   --  130 140 114  HGB g/dL 88.6*  --   --  89.4*  --  9.8*  CREATININE mg/dL 8.10*  --   --  8.24*  --  1.75*         Labs (3/24): hgb 10.7, LDH 157, LFTs normal, creatinine 2.75 Labs (5/24): K 3.6, creatinine 2.7, LFTs normal, TSH normal Labs (7/24): hgb 11.7, LDH 172, K 3.5, creatinine 2.33 Labs (10/24): K 3.4, creatinine 2.24 Labs (12/24): K 4, creatinine 2.37 Labs (2/25): TSH normal Labs (3/25): hgb 12.5, K 4.9, creatinine 2.57 Labs (4/25): creatinine 2.8 Labs (5/25): K 4, creatinine 1.97, LDH 163, hgb 12.3 Labs (6/25): K 4.3, creatinine 2.24, LDH 175, hgb 11.9, plts 220 Labs (7/25): K 4.2, creatinine 1.9 => 2.33, hgb 8.8 => 9.6, Transferrin saturation 7%.  Labs (10/25): K 3.9, creatinine 2.2, hgb 9.8 Labs (11/25): K 3.7, creatinine 1.89, hgb 11.3, LDH 140  PMH: 1. HTN 2. Hyperlipidemia 3. CAD: H/o PCI to OM1 2007 and PCI to RCA in 2013.  Uncertain of circumstances but do not sound like ACS events.  4. CKD stage IV: Patient was on HD for several months post-LVAD but was able to come off.  5. Atrial fibrillation: Paroxysmal 6. Chronic systolic CHF: Long-standing cardiomyopathy with EF in the 25% range.  Cause uncertain, has history of PCI to RCA and OM1, but degree of CAD does not seem to explain severity of cardiomyopathy. Father had history of  CAD/CHF.  Not heavy drinker. Has [employer] dual chamber ICD.  - Echo (12/22): EF < 20%, severe LV dilation, restrictive diastolic function, moderate RV dysfunction, moderate MR, mod-severe TR.  - 12/22 cardiogenic shock with Impella 5.5 placement then Heartmate 3 LVAD.  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  - Echo (7/24): EF < 20%, IV septum midline to slightly leftward, RV moderately dilated with moderately decreased systolic function, IVC dilated with severe TR. - Echo (5/25): EF 20-25%, mild LVH, LVAD present, mild RV dysfunction, moderate AI, mild RV dysfunction with normal RV size, severe TR.  7. VT - Nausea with amiodarone .  8. RV  failure/severe TR 9. Citrobacter UTI (8/23) 10. Left groin abscess 3/24 11. 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  12. GI bleeding: 7/25, multiple AVMs treated with APC.  13. UTIs 14. C difficile 11/25   Current Outpatient Medications  Medication Sig Dispense Refill   colchicine  0.6 MG tablet Take 1 tablet (0.6 mg total) by mouth daily. 90 tablet 3   pantoprazole  (PROTONIX ) 40 MG tablet Take twice a day for 1 month and then can decrease back to once daily. 60 tablet 1   potassium chloride  SA (KLOR-CON  M) 20 MEQ tablet Take 20 mEq by mouth daily.     rosuvastatin  (CRESTOR ) 10 MG tablet Take 1 tablet (10 mg total) by mouth at bedtime. 30 tablet 0   saccharomyces boulardii (FLORASTOR) 250 MG capsule Take 1 capsule (250 mg total) by mouth 2 (two) times daily. 60 capsule 6   sildenafil  (REVATIO ) 20 MG tablet Take 20 mg by mouth 3 (three) times daily.     warfarin (COUMADIN ) 2 MG tablet Take 2 mg by mouth daily.     acetaminophen  (TYLENOL ) 325 MG tablet Take 2 tablets (650 mg total) by mouth every 4 (four) hours as needed for headache or mild pain (pain score 1-3). (Patient not taking: Reported on 08/09/2024)     allopurinol  (ZYLOPRIM ) 100 MG tablet Take 0.5 tablets (50 mg total) by mouth  daily. 45 tablet 3   furosemide  (LASIX ) 40 MG tablet Take 1.5 tablets (60 mg total) by mouth daily. 30 tablet 3   No current facility-administered medications for this encounter.    Neosporin [neomycin-bacitracin zn-polymyx], Doxycycline , Mushroom extract complex (obsolete), and Tape  REVIEW OF SYSTEMS: All systems negative except as listed in HPI, PMH and Problem list.  I reviewed the LVAD parameters from today, and compared the results to the patient's prior recorded data.  No programming changes were made.  The LVAD is functioning within specified parameters.  The patient performs LVAD self-test daily.  LVAD interrogation was negative for any significant power changes, alarms or PI events/speed drops.  LVAD equipment check completed and is in good working order.  Back-up equipment present.   LVAD education done on emergency procedures and precautions and reviewed exit site care.    Vitals:   08/09/24 1036 08/09/24 1037  BP: 104/69 (!) 82/0  Pulse: 91   SpO2: 100%   Weight: 77.6 kg (171 lb)   MAP 80  Physical Exam: General: Well appearing this am. NAD.  HEENT: Normal. Neck: Supple, JVP 8-9 cm. Carotids OK.  Cardiac:  Mechanical heart sounds with LVAD hum present.  Lungs:  CTAB, normal effort.  Abdomen:  NT, ND, no HSM. No bruits or masses. +BS  LVAD exit site: Well-healed and incorporated. Dressing dry and intact. No erythema or drainage. Stabilization device present and accurately applied. Driveline dressing changed daily per sterile technique. Extremities:  Warm and dry. No cyanosis, clubbing, rash, or edema. Left knee warm.  Neuro:  Alert & oriented x 3. Cranial nerves grossly intact. Moves all 4 extremities w/o difficulty. Affect pleasant    ASSESSMENT AND PLAN: 1. 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  No evidence for overt bleeding. Hgb 11.3 today.  - Continue Protonix  40 mg daily.  - I recommended that he start monthly octreotide  infusions to  decrease risk for recurrent AVM bleeding.  He does not want to start this yet but is willing to start it if he has recurrent bleeding.  2. Chronic  systolic CHF:  Long-standing cardiomyopathy.  [employer] dual chamber ICD.  Echo in 12/22 with EF < 20%, severe LV dilation, restrictive diastolic function, moderate RV dysfunction, moderate MR, mod-severe TR. Cause of cardiomyopathy is uncertain.  He has a history of CAD, but I do not think that the described CAD from the past could explain his cardiomyopathy, but CAD could have progressed.  He was admitted in 12/22 with cardiogenic shock and Impella 5.5. s/p HM III VAD on 09/17/22.  Post-op course complicated by AKI and RV failure. He was on HD for several months but is now off.  Echo in 7/24 showed EF < 20%, IV septum midline to slightly leftward, RV moderately dilated with moderately decreased systolic function, IVC dilated with severe TR.  Echo in 5/25 with EF 20-25%, mild LVH, LVAD present, mild RV dysfunction, moderate AI, mild RV dysfunction with normal RV size, severe TR.  NYHA class II, volume status ok, tends to have some JVD in setting of severe TR.  LVAD parameters reviewed today and stable, no alarms.  - Continue Lasix  60 mg daily. Creatinine stable today at 1.89.    - Continue sildenafil  20 mg tid.  - Continue warfarin, INR goal decreased to 2-2.5 with GI bleeding.  3. Tricuspid regurgitation:  Tricuspid repair not done at time of VAD due to proximity of ICD wires and hypotension during surgery. He has severe TR.  4. Atrial fibrillation: Paroxysmal.  He is off amiodarone  with resolution of nausea.  I will not restart it.  He has been a-paced recently.  5. CKD stage IV: Suspect intra-op hypotension at the time of LVAD placement led to development of ATN => urine sediment looked like ATN per renal. Had initial CVVH and transitioned over to HD. He is now off HD.  Creatinine stable today 1.89.  6. CAD: History of PCI to OM1 in 2007 and RCA in  2013.  No CP or ACs.  - Deferred cardiac cath in 12/22 due to AKI and plan for VAD - Continue Crestor .   7. Abnormal thyroid  function tests: Patient has had elevated TSH and free T4, mixed pattern. We have stopped amiodarone . 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  Most recent TSH was normal.  - He follows with endocrinology.   8. Nausea: This seems to have been due to amiodarone , resolved off amiodarone .   9. 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  INR goal increased to 2.5-3 but then decreased back to 2-2.5 with GI bleeding.  10. Bladder abnormality:  Abnormal renal ultrasound with bladder lesion noted.  He has followup with urology scheduled.  11. C difficile colitis: He has completed treatment and is considerably improved.   Followup in 2 months.  I spent 41 minutes reviewing records, interviewing/examining patient, and managing orders.    Ezra Shuck 08/09/2024

## 2024-08-10 ENCOUNTER — Ambulatory Visit: Admitting: Podiatry

## 2024-08-10 ENCOUNTER — Ambulatory Visit: Admitting: Physical Therapy

## 2024-08-10 ENCOUNTER — Ambulatory Visit: Payer: Self-pay | Admitting: Pharmacist

## 2024-08-10 ENCOUNTER — Ambulatory Visit

## 2024-08-10 ENCOUNTER — Encounter: Admitting: Speech Pathology

## 2024-08-10 ENCOUNTER — Ambulatory Visit (HOSPITAL_COMMUNITY): Payer: Self-pay | Admitting: Pharmacist

## 2024-08-10 DIAGNOSIS — B351 Tinea unguium: Secondary | ICD-10-CM

## 2024-08-10 DIAGNOSIS — M79674 Pain in right toe(s): Secondary | ICD-10-CM | POA: Diagnosis not present

## 2024-08-10 DIAGNOSIS — I5022 Chronic systolic (congestive) heart failure: Secondary | ICD-10-CM | POA: Diagnosis not present

## 2024-08-10 DIAGNOSIS — M79675 Pain in left toe(s): Secondary | ICD-10-CM

## 2024-08-10 NOTE — Progress Notes (Signed)
  Subjective:  Patient ID: Isaac [name], male    DOB: 1951-02-06,  MRN: [id number]  Chief Complaint  Patient presents with   Nail Problem    Nail trim    73 y.o. male returns for the above complaint.  Patient presents with thickened elongated dystrophic mycotic toenails x 10 mild pain on palpation hurts with ambulation is with pressure.  He wants me to have him debride down he is not able to do it himself  Objective:  There were no vitals filed for this visit. Podiatric Exam: Vascular: dorsalis pedis and posterior tibial pulses are palpable bilateral. Capillary return is immediate. Temperature gradient is WNL. Skin turgor WNL  Sensorium: Normal Semmes Weinstein monofilament test. Normal tactile sensation bilaterally. Nail Exam: Pt has thick disfigured discolored nails with subungual debris noted bilateral entire nail hallux through fifth toenails.  Pain on palpation to the nails. Ulcer Exam: There is no evidence of ulcer or pre-ulcerative changes or infection. Orthopedic Exam: Muscle tone and strength are WNL. No limitations in general ROM. No crepitus or effusions noted.  Skin: No Porokeratosis. No infection or ulcers    Assessment & Plan:   1. Pain due to onychomycosis of toenails of both feet     Patient was evaluated and treated and all questions answered.  Onychomycosis with pain  -Nails palliatively debrided as below. -Educated on self-care  Procedure: Nail Debridement Rationale: pain  Type of Debridement: manual, sharp debridement. Instrumentation: Nail nipper, rotary burr. Number of Nails: 10  Procedures and Treatment: Consent by patient was obtained for treatment procedures. The patient understood the discussion of treatment and procedures well. All questions were answered thoroughly reviewed. Debridement of mycotic and hypertrophic toenails, 1 through 5 bilateral and clearing of subungual debris. No ulceration, no infection noted.  Return Visit-Office Procedure:  Patient instructed to return to the office for a follow up visit 3 months for continued evaluation and treatment.  Franky Blanch, DPM    No follow-ups on file.

## 2024-08-11 LAB — CUP PACEART REMOTE DEVICE CHECK
Battery Remaining Longevity: 120 mo
Battery Remaining Percentage: 100 %
Brady Statistic RA Percent Paced: 71 %
Brady Statistic RV Percent Paced: 36 %
Date Time Interrogation Session: 20251120022100
HighPow Impedance: 47 Ohm
Implantable Lead Connection Status: 753985
Implantable Lead Connection Status: 753985
Implantable Lead Implant Date: 20140205
Implantable Lead Implant Date: 20140205
Implantable Lead Location: 753859
Implantable Lead Location: 753860
Implantable Lead Model: 296
Implantable Lead Model: 4470
Implantable Lead Serial Number: 121707
Implantable Lead Serial Number: 727876
Implantable Pulse Generator Implant Date: 20250521
Lead Channel Impedance Value: 339 Ohm
Lead Channel Impedance Value: 453 Ohm
Lead Channel Setting Pacing Amplitude: 2 V
Lead Channel Setting Pacing Amplitude: 3.5 V
Lead Channel Setting Pacing Pulse Width: 1 ms
Lead Channel Setting Sensing Sensitivity: 0.5 mV
Pulse Gen Serial Number: 693113
Zone Setting Status: 755011

## 2024-08-13 ENCOUNTER — Ambulatory Visit: Payer: Self-pay | Admitting: Internal Medicine

## 2024-08-14 NOTE — Progress Notes (Signed)
 Remote ICD Transmission

## 2024-08-15 ENCOUNTER — Ambulatory Visit: Admitting: Physical Therapy

## 2024-08-15 ENCOUNTER — Encounter: Admitting: Speech Pathology

## 2024-08-16 ENCOUNTER — Ambulatory Visit (HOSPITAL_COMMUNITY): Payer: Self-pay | Admitting: Pharmacist

## 2024-08-16 LAB — POCT INR: INR: 2.8 (ref 2.0–3.0)

## 2024-08-22 ENCOUNTER — Encounter: Payer: Self-pay | Admitting: Urology

## 2024-08-22 ENCOUNTER — Encounter: Admitting: Speech Pathology

## 2024-08-22 ENCOUNTER — Ambulatory Visit: Admitting: Physical Therapy

## 2024-08-22 ENCOUNTER — Ambulatory Visit: Admitting: Urology

## 2024-08-22 VITALS — BP 130/80 | HR 74 | Ht 72.0 in | Wt 171.0 lb

## 2024-08-22 DIAGNOSIS — Z87440 Personal history of urinary (tract) infections: Secondary | ICD-10-CM | POA: Diagnosis not present

## 2024-08-22 DIAGNOSIS — N3289 Other specified disorders of bladder: Secondary | ICD-10-CM | POA: Diagnosis not present

## 2024-08-22 LAB — URINALYSIS, COMPLETE
Bilirubin, UA: NEGATIVE
Glucose, UA: NEGATIVE
Ketones, UA: NEGATIVE
Leukocytes,UA: NEGATIVE
Nitrite, UA: NEGATIVE
Protein,UA: NEGATIVE
RBC, UA: NEGATIVE
Specific Gravity, UA: 1.015 (ref 1.005–1.030)
Urobilinogen, Ur: 0.2 mg/dL (ref 0.2–1.0)
pH, UA: 6 (ref 5.0–7.5)

## 2024-08-22 LAB — MICROSCOPIC EXAMINATION: Bacteria, UA: NONE SEEN

## 2024-08-22 NOTE — Progress Notes (Signed)
 08/22/2024 6:44 PM   Isaac [name] [birth year] [id number]  Referring provider: Rolan Ezra RAMAN, MD [phone] [address] [place],  [place] [address]  Chief Complaint  Patient presents with   Urinary Tract Infection    Hospital follow-up    HPI: Isaac [name] is a 73 y.o. male who presents in follow-up of recent hospitalization for urinary tract infection.  His spouse was with him today who contributed to the history.  Admitted to [facility] 07/02/2024 for E. coli UTI/bacteremia RUS performed during that hospitalization was remarkable for a polypoid lesion on the posterior bladder wall Discharge 07/04/2024 on oral antibiotics Readmitted 07/18/2024 with C. difficile colitis; discharged 07/25/2024 States he was diagnosed with a bladder tumor over 20 years ago and underwent TURBT and was told that it was bladder cancer According to the patient he was diagnosed with a recurrent tumor ~10 years ago and TURBT was recommended but he refused Over this timeframe he has not had gross hematuria and may have had 1 other UTI in the past 10 years   PMH: Past Medical History:  Diagnosis Date   Arrhythmia    atrial fibrillation   CHF (congestive heart failure) (HCC)    Chronic kidney disease    Coronary artery disease    Hyperlipidemia    Hypertension    Myocardial infarct [facility])     Surgical History: Past Surgical History:  Procedure Laterality Date   CARDIAC DEFIBRILLATOR PLACEMENT  feb 2014   COLONOSCOPY N/A 03/29/2024   Procedure: COLONOSCOPY;  Surgeon: Legrand Victory LITTIE DOUGLAS, MD;  Location: [facility] ENDOSCOPY;  Service: Gastroenterology;  Laterality: N/A;   ENTEROSCOPY N/A 03/22/2024   Procedure: ENTEROSCOPY;  Surgeon: Leigh Elspeth SQUIBB, MD;  Location: [facility] ENDOSCOPY;  Service: Gastroenterology;  Laterality: N/A;   ESOPHAGOGASTRODUODENOSCOPY N/A 03/29/2024   Procedure: EGD (ESOPHAGOGASTRODUODENOSCOPY);  Surgeon: Legrand Victory LITTIE DOUGLAS, MD;  Location: [facility] ENDOSCOPY;  Service:  Gastroenterology;  Laterality: N/A;   ICD GENERATOR CHANGEOUT N/A 02/09/2024   Procedure: ICD GENERATOR CHANGEOUT;  Surgeon: Waddell Danelle ORN, MD;  Location: [facility] INVASIVE CV LAB;  Service: Cardiovascular;  Laterality: N/A;   INSERTION OF IMPLANTABLE LEFT VENTRICULAR ASSIST DEVICE N/A 09/17/2021   Procedure: INSERTION OF IMPLANTABLE LEFT VENTRICULAR ASSIST DEVICE AND INSERTION OF FEMORAL ARTERIAL LINE;  Surgeon: Lucas Dorise POUR, MD;  Location: [facility] OR;  Service: Open Heart Surgery;  Laterality: N/A;   IR FLUORO GUIDE CV LINE RIGHT  09/27/2021   IR THORACENTESIS ASP PLEURAL SPACE W/IMG GUIDE  09/27/2021   IR US  GUIDE VASC ACCESS RIGHT  09/27/2021   PLACEMENT OF IMPELLA LEFT VENTRICULAR ASSIST DEVICE N/A 09/12/2021   Procedure: PLACEMENT OF IMPELLA 5.5 LEFT VENTRICULAR ASSIST DEVICE;  Surgeon: Kerrin Elspeth BROCKS, MD;  Location: [facility] OR;  Service: Open Heart Surgery;  Laterality: N/A;   RIGHT HEART CATH N/A 09/08/2021   Procedure: RIGHT HEART CATH;  Surgeon: Rolan Ezra RAMAN, MD;  Location: [facility] INVASIVE CV LAB;  Service: Cardiovascular;  Laterality: N/A;   TEE WITHOUT CARDIOVERSION N/A 09/12/2021   Procedure: TRANSESOPHAGEAL ECHOCARDIOGRAM (TEE);  Surgeon: Kerrin Elspeth BROCKS, MD;  Location: [facility] OR;  Service: Open Heart Surgery;  Laterality: N/A;   TEE WITHOUT CARDIOVERSION N/A 09/17/2021   Procedure: TRANSESOPHAGEAL ECHOCARDIOGRAM (TEE);  Surgeon: Lucas Dorise POUR, MD;  Location: [facility] OR;  Service: Open Heart Surgery;  Laterality: N/A;    Home Medications:  Allergies as of 08/22/2024       Reactions   Neosporin [neomycin-bacitracin Zn-polymyx] Hives   Doxycycline   Nausea And Vomiting   Mushroom Extract Complex (obsolete) Nausea And Vomiting   Tape Other (See Comments)   Some tapes/dressings can irritate the skin        Medication List        Accurate as of August 22, 2024  6:44 PM. If you have any questions, ask your nurse or doctor.          acetaminophen  325 MG tablet Commonly known as:  TYLENOL  Take 2 tablets (650 mg total) by mouth every 4 (four) hours as needed for headache or mild pain (pain score 1-3).   allopurinol  100 MG tablet Commonly known as: ZYLOPRIM  Take 0.5 tablets (50 mg total) by mouth daily.   colchicine  0.6 MG tablet Take 1 tablet (0.6 mg total) by mouth daily.   furosemide  40 MG tablet Commonly known as: LASIX  Take 1.5 tablets (60 mg total) by mouth daily.   pantoprazole  40 MG tablet Commonly known as: PROTONIX  Take twice a day for 1 month and then can decrease back to once daily.   potassium chloride  SA 20 MEQ tablet Commonly known as: KLOR-CON  M Take 20 mEq by mouth daily.   rosuvastatin  10 MG tablet Commonly known as: CRESTOR  Take 1 tablet (10 mg total) by mouth at bedtime.   saccharomyces boulardii 250 MG capsule Commonly known as: FLORASTOR Take 1 capsule (250 mg total) by mouth 2 (two) times daily.   sildenafil  20 MG tablet Commonly known as: REVATIO  Take 20 mg by mouth 3 (three) times daily.   warfarin 2 MG tablet Commonly known as: COUMADIN  Take as directed by the anticoagulation clinic. If you are unsure how to take this medication, talk to your nurse or doctor. Original instructions: Take 2 mg by mouth daily.        Allergies:  Allergies  Allergen Reactions   Neosporin [Neomycin-Bacitracin Zn-Polymyx] Hives   Doxycycline  Nausea And Vomiting   Mushroom Extract Complex (Obsolete) Nausea And Vomiting   Tape Other (See Comments)    Some tapes/dressings can irritate the skin    Family History: Family History  Problem Relation Age of Onset   Hypertension Mother    Stroke Mother    Hypertension Father    Hyperlipidemia Father    Heart attack Father    Heart failure Father    Stroke Sister     Social History:  reports that he has quit smoking. He has never used smokeless tobacco. He reports that he does not drink alcohol and does not use drugs.   Physical Exam: BP 130/80   Pulse 74   Ht 6' (1.829 m)   Wt 171  lb (77.6 kg)   BMI 23.19 kg/m   Constitutional:  Alert, No acute distress. HEENT: [place] AT Respiratory: Normal respiratory effort, no increased work of breathing. Psychiatric: Normal mood and affect.  Laboratory Data:  Urinalysis Dipstick/microscopy negative  Pertinent Imaging: RUS 07/03/2024 was personally reviewed and interpreted.  Agree with radiology interpretation   US  RENAL  Narrative CLINICAL DATA:  Urinary tract infection.  EXAM: RENAL / URINARY TRACT ULTRASOUND COMPLETE  COMPARISON:  CT scan 03/21/2024.  FINDINGS: Right Kidney:  Renal measurements: 9.5 x 6.2 x 6.4 cm = volume: 197 mL. Echogenicity within normal limits. No mass or hydronephrosis visualized.  Left Kidney:  Renal measurements: 9.9 x 5.6 x 5.4 cm = volume: 160 mL. Echogenicity within normal limits. No mass or hydronephrosis visualized.  Bladder:  Polypoid lesion identified along the mucosal surface of the posterior bladder wall. While no  definite color flow can be demonstrated within this structure on Doppler assessment, mucosal neoplasm is not excluded.  Other:  None.  IMPRESSION: 1. No evidence for hydronephrosis. 2. Polypoid lesion along the mucosal surface of the posterior bladder wall. While no definite color flow can be demonstrated within this structure on Doppler assessment, mucosal neoplasm is not excluded. Hematuria protocol CT with and without contrast or cystoscopy recommended to further evaluate.   Electronically Signed By: Camellia Candle M.D. On: 07/03/2024 06:44   Assessment & Plan:    1.  Bladder mass Polypoid bladder mass incidentally seen on bladder survey views during RUS Findings were discussed and recommended either diagnostic office cystoscopy or cystoscopy in same-day surgery and TURBT if tumor was identified The procedures were discussed including potential risks of bleeding, infection and possibility of bladder injury with TURBT.  He also has significant  medical comorbidities and significant cardiovascular disease and would need preop clearance I discussed with him today he did not need to make a decision but can think over these options and we will touch base with him next week.  They were encouraged to call or message for any questions  2.  History urinary tract infection Urinalysis today clear   Glendia JAYSON Barba, MD  [facility] [address] [place], [place] [address] [phone]

## 2024-08-24 ENCOUNTER — Ambulatory Visit: Admitting: Physical Therapy

## 2024-08-24 ENCOUNTER — Encounter: Admitting: Speech Pathology

## 2024-08-25 ENCOUNTER — Ambulatory Visit (HOSPITAL_COMMUNITY): Payer: Self-pay | Admitting: Pharmacist

## 2024-08-25 LAB — POCT INR: INR: 1.5 — AB (ref 2.0–3.0)

## 2024-08-29 ENCOUNTER — Ambulatory Visit: Admitting: Physical Therapy

## 2024-08-29 ENCOUNTER — Encounter: Admitting: Speech Pathology

## 2024-08-31 ENCOUNTER — Ambulatory Visit: Admitting: Physical Therapy

## 2024-08-31 ENCOUNTER — Encounter: Admitting: Speech Pathology

## 2024-09-04 ENCOUNTER — Ambulatory Visit (HOSPITAL_COMMUNITY): Payer: Self-pay | Admitting: Pharmacist

## 2024-09-04 LAB — POCT INR: INR: 1.4 — AB (ref 2.0–3.0)

## 2024-09-05 ENCOUNTER — Ambulatory Visit: Admitting: Physical Therapy

## 2024-09-05 ENCOUNTER — Encounter: Admitting: Speech Pathology

## 2024-09-07 ENCOUNTER — Ambulatory Visit: Admitting: Physical Therapy

## 2024-09-07 ENCOUNTER — Encounter: Admitting: Speech Pathology

## 2024-09-10 LAB — POCT INR: INR: 1.8 — AB (ref 2.0–3.0)

## 2024-09-11 ENCOUNTER — Ambulatory Visit (HOSPITAL_COMMUNITY): Payer: Self-pay | Admitting: Pharmacist

## 2024-09-12 ENCOUNTER — Encounter: Admitting: Speech Pathology

## 2024-09-12 ENCOUNTER — Ambulatory Visit: Admitting: Physical Therapy

## 2024-09-19 ENCOUNTER — Ambulatory Visit: Admitting: Physical Therapy

## 2024-09-19 ENCOUNTER — Encounter: Admitting: Speech Pathology

## 2024-09-22 ENCOUNTER — Ambulatory Visit (HOSPITAL_COMMUNITY): Payer: Self-pay | Admitting: Pharmacist

## 2024-09-22 ENCOUNTER — Ambulatory Visit (HOSPITAL_COMMUNITY): Admitting: Cardiology

## 2024-09-22 LAB — POCT INR: INR: 1.9 — AB (ref 2.0–3.0)

## 2024-09-25 ENCOUNTER — Other Ambulatory Visit: Payer: Self-pay | Admitting: *Deleted

## 2024-09-26 ENCOUNTER — Ambulatory Visit: Admitting: Physical Therapy

## 2024-09-26 ENCOUNTER — Encounter

## 2024-09-26 ENCOUNTER — Encounter: Admitting: Speech Pathology

## 2024-09-28 ENCOUNTER — Ambulatory Visit: Admitting: Physical Therapy

## 2024-09-28 ENCOUNTER — Encounter: Admitting: Speech Pathology

## 2024-09-29 ENCOUNTER — Ambulatory Visit (HOSPITAL_COMMUNITY): Payer: Self-pay | Admitting: Pharmacist

## 2024-09-29 LAB — POCT INR: INR: 1.8 — AB (ref 2.0–3.0)

## 2024-10-03 ENCOUNTER — Encounter: Admitting: Speech Pathology

## 2024-10-03 ENCOUNTER — Ambulatory Visit: Admitting: Physical Therapy

## 2024-10-05 ENCOUNTER — Ambulatory Visit: Admitting: Physical Therapy

## 2024-10-05 ENCOUNTER — Encounter: Admitting: Speech Pathology

## 2024-10-06 ENCOUNTER — Ambulatory Visit (HOSPITAL_COMMUNITY): Payer: Self-pay | Admitting: Pharmacist

## 2024-10-06 LAB — POCT INR: INR: 1.7 — AB (ref 2.0–3.0)

## 2024-10-12 ENCOUNTER — Telehealth (HOSPITAL_COMMUNITY): Payer: Self-pay
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
## 2024-10-16 ENCOUNTER — Ambulatory Visit (HOSPITAL_COMMUNITY): Admitting: Cardiology

## 2024-10-16 LAB — POCT INR: INR: 1.6 — AB (ref 2.0–3.0)

## 2024-10-18 ENCOUNTER — Ambulatory Visit (HOSPITAL_COMMUNITY): Payer: Self-pay | Admitting: Pharmacist

## 2024-11-02 ENCOUNTER — Ambulatory Visit (HOSPITAL_COMMUNITY): Admitting: Cardiology

## 2024-11-09 ENCOUNTER — Encounter

## 2025-02-08 ENCOUNTER — Encounter

## 2025-05-10 ENCOUNTER — Encounter

## 2025-08-09 ENCOUNTER — Encounter

## 2025-11-08 ENCOUNTER — Encounter

## 2026-02-07 ENCOUNTER — Encounter
